# Patient Record
Sex: Male | Born: 1965 | Race: White | Hispanic: No | Marital: Married | Smoking: Never smoker
Health system: Southern US, Community
[De-identification: ages and names within clinical notes are randomized; demographics above are authoritative.]

## PROBLEM LIST (undated history)

## (undated) DIAGNOSIS — M5136 Other intervertebral disc degeneration, lumbar region: Secondary | ICD-10-CM

## (undated) DIAGNOSIS — E669 Obesity, unspecified: Secondary | ICD-10-CM

## (undated) DIAGNOSIS — E785 Hyperlipidemia, unspecified: Secondary | ICD-10-CM

## (undated) DIAGNOSIS — I10 Essential (primary) hypertension: Secondary | ICD-10-CM

## (undated) DIAGNOSIS — G4733 Obstructive sleep apnea (adult) (pediatric): Secondary | ICD-10-CM

## (undated) DIAGNOSIS — M51369 Other intervertebral disc degeneration, lumbar region without mention of lumbar back pain or lower extremity pain: Secondary | ICD-10-CM

## (undated) HISTORY — PX: NO PAST SURGERIES: SHX2092

## (undated) HISTORY — PX: BACK SURGERY: SHX140

## (undated) HISTORY — DX: Obstructive sleep apnea (adult) (pediatric): G47.33

## (undated) HISTORY — DX: Essential (primary) hypertension: I10

## (undated) HISTORY — DX: Obesity, unspecified: E66.9

## (undated) HISTORY — DX: Hyperlipidemia, unspecified: E78.5

---

## 2000-03-16 ENCOUNTER — Emergency Department (HOSPITAL_COMMUNITY): Admission: EM | Admit: 2000-03-16 | Discharge: 2000-03-16 | Payer: Self-pay | Admitting: Emergency Medicine

## 2002-12-25 ENCOUNTER — Ambulatory Visit (HOSPITAL_BASED_OUTPATIENT_CLINIC_OR_DEPARTMENT_OTHER): Admission: RE | Admit: 2002-12-25 | Discharge: 2002-12-25 | Payer: Self-pay | Admitting: Family Medicine

## 2004-07-15 ENCOUNTER — Ambulatory Visit: Payer: Self-pay | Admitting: Family Medicine

## 2004-08-12 ENCOUNTER — Ambulatory Visit: Payer: Self-pay | Admitting: Family Medicine

## 2004-12-23 ENCOUNTER — Ambulatory Visit: Payer: Self-pay | Admitting: Internal Medicine

## 2005-01-20 ENCOUNTER — Ambulatory Visit: Payer: Self-pay | Admitting: Internal Medicine

## 2005-01-21 ENCOUNTER — Emergency Department (HOSPITAL_COMMUNITY): Admission: EM | Admit: 2005-01-21 | Discharge: 2005-01-21 | Payer: Self-pay | Admitting: Emergency Medicine

## 2005-01-23 ENCOUNTER — Ambulatory Visit: Payer: Self-pay | Admitting: Internal Medicine

## 2005-02-10 ENCOUNTER — Ambulatory Visit: Payer: Self-pay | Admitting: Internal Medicine

## 2005-05-26 ENCOUNTER — Ambulatory Visit: Payer: Self-pay | Admitting: Internal Medicine

## 2005-05-27 ENCOUNTER — Encounter: Payer: Self-pay | Admitting: Internal Medicine

## 2005-07-17 ENCOUNTER — Ambulatory Visit: Payer: Self-pay | Admitting: Family Medicine

## 2006-04-20 ENCOUNTER — Ambulatory Visit: Payer: Self-pay | Admitting: Internal Medicine

## 2006-04-22 DIAGNOSIS — E669 Obesity, unspecified: Secondary | ICD-10-CM

## 2006-04-22 DIAGNOSIS — E119 Type 2 diabetes mellitus without complications: Secondary | ICD-10-CM | POA: Insufficient documentation

## 2006-04-22 DIAGNOSIS — I10 Essential (primary) hypertension: Secondary | ICD-10-CM | POA: Insufficient documentation

## 2006-10-23 ENCOUNTER — Encounter (INDEPENDENT_AMBULATORY_CARE_PROVIDER_SITE_OTHER): Payer: Self-pay | Admitting: *Deleted

## 2006-12-07 ENCOUNTER — Ambulatory Visit: Payer: Self-pay | Admitting: Internal Medicine

## 2006-12-21 ENCOUNTER — Ambulatory Visit: Payer: Self-pay | Admitting: Internal Medicine

## 2006-12-22 LAB — CONVERTED CEMR LAB
AST: 30 units/L (ref 0–37)
Bilirubin, Direct: 0.1 mg/dL (ref 0.0–0.3)
Chloride: 109 meq/L (ref 96–112)
Creatinine,U: 228.1 mg/dL
Eosinophils Absolute: 0.5 10*3/uL (ref 0.0–0.6)
Eosinophils Relative: 9.7 % — ABNORMAL HIGH (ref 0.0–5.0)
GFR calc non Af Amer: 87 mL/min
Glucose, Bld: 129 mg/dL — ABNORMAL HIGH (ref 70–99)
HCT: 41.3 % (ref 39.0–52.0)
Hemoglobin: 14.5 g/dL (ref 13.0–17.0)
Hgb A1c MFr Bld: 5.8 % (ref 4.6–6.0)
Lymphocytes Relative: 20.6 % (ref 12.0–46.0)
MCV: 91 fL (ref 78.0–100.0)
Neutro Abs: 3.4 10*3/uL (ref 1.4–7.7)
Neutrophils Relative %: 61.8 % (ref 43.0–77.0)
RBC: 4.54 M/uL (ref 4.22–5.81)
Sodium: 143 meq/L (ref 135–145)
WBC: 5.4 10*3/uL (ref 4.5–10.5)

## 2006-12-28 ENCOUNTER — Encounter: Payer: Self-pay | Admitting: Internal Medicine

## 2007-01-04 ENCOUNTER — Encounter: Payer: Self-pay | Admitting: Internal Medicine

## 2007-03-01 ENCOUNTER — Ambulatory Visit: Payer: Self-pay | Admitting: Internal Medicine

## 2007-04-26 ENCOUNTER — Ambulatory Visit: Payer: Self-pay | Admitting: Internal Medicine

## 2007-04-26 DIAGNOSIS — E785 Hyperlipidemia, unspecified: Secondary | ICD-10-CM | POA: Insufficient documentation

## 2007-08-02 ENCOUNTER — Ambulatory Visit: Payer: Self-pay | Admitting: Internal Medicine

## 2007-08-02 DIAGNOSIS — R220 Localized swelling, mass and lump, head: Secondary | ICD-10-CM | POA: Insufficient documentation

## 2007-08-02 DIAGNOSIS — M25579 Pain in unspecified ankle and joints of unspecified foot: Secondary | ICD-10-CM | POA: Insufficient documentation

## 2007-08-02 DIAGNOSIS — R221 Localized swelling, mass and lump, neck: Secondary | ICD-10-CM

## 2007-08-04 ENCOUNTER — Encounter (INDEPENDENT_AMBULATORY_CARE_PROVIDER_SITE_OTHER): Payer: Self-pay | Admitting: *Deleted

## 2007-08-05 LAB — CONVERTED CEMR LAB
Cholesterol: 194 mg/dL (ref 0–200)
HDL: 29.3 mg/dL — ABNORMAL LOW (ref >39.0)
Microalb Creat Ratio: 19 mg/g (ref 0.0–30.0)
Total CHOL/HDL Ratio: 6.6
Triglycerides: 228 mg/dL (ref 0–149)
VLDL: 46 mg/dL — ABNORMAL HIGH (ref 0–40)

## 2007-08-15 ENCOUNTER — Emergency Department (HOSPITAL_COMMUNITY): Admission: EM | Admit: 2007-08-15 | Discharge: 2007-08-15 | Payer: Self-pay | Admitting: Emergency Medicine

## 2007-08-15 IMAGING — CR DG ANKLE COMPLETE 3+V*L*
3 series · 3 of 3 positions shown · non-contrast
Comparison: None

CLINICAL DATA: Ankle pain for 3 weeks

LEFT ANKLE COMPLETE - 3+ VIEW

[t ankle joint ap left *]
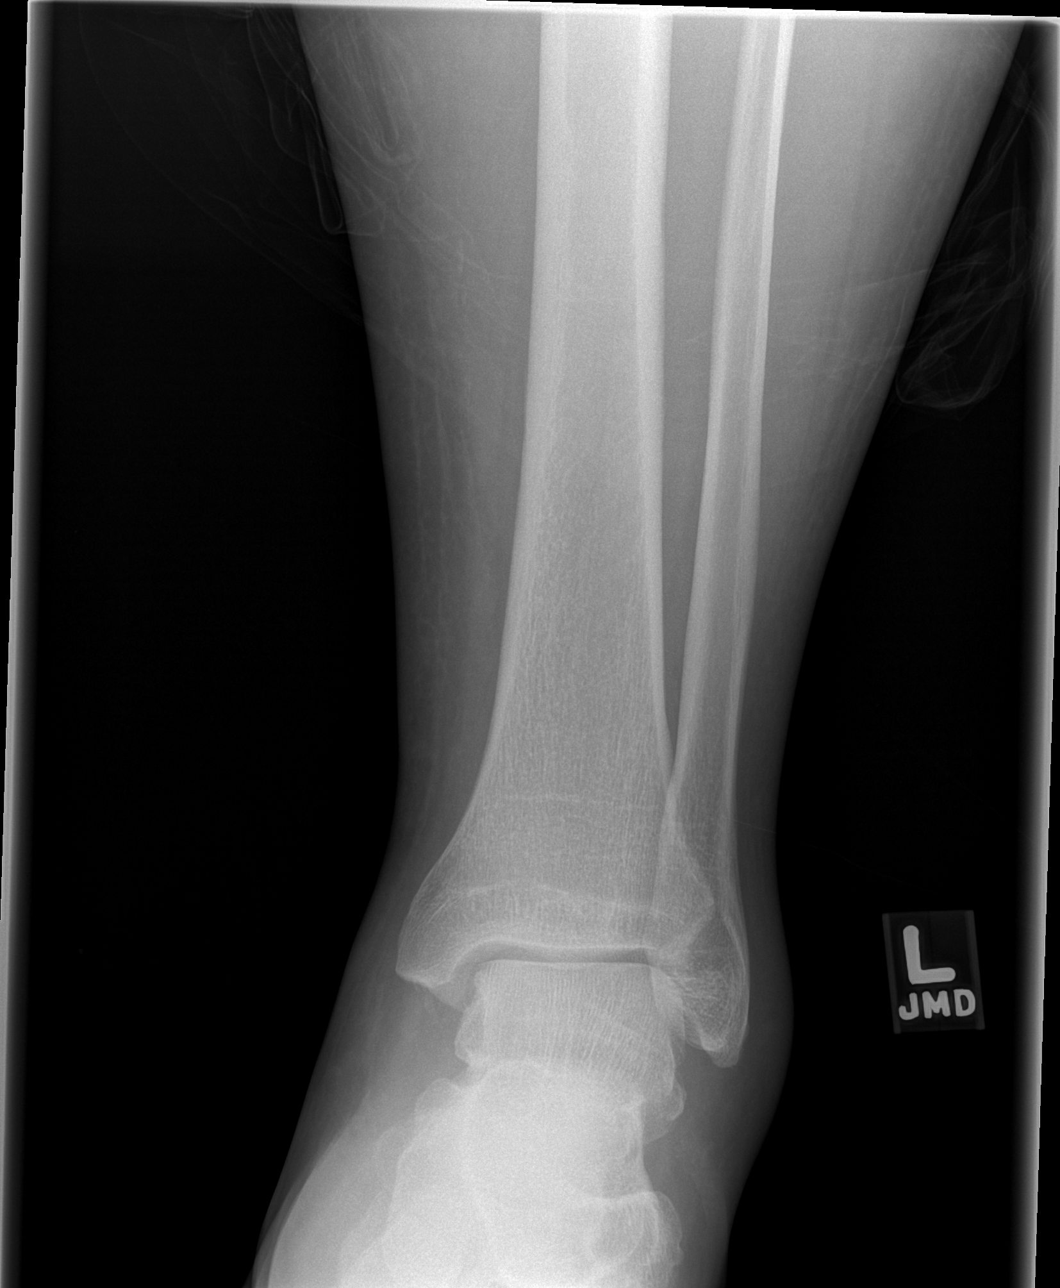

[t ankle joint oblique left *]
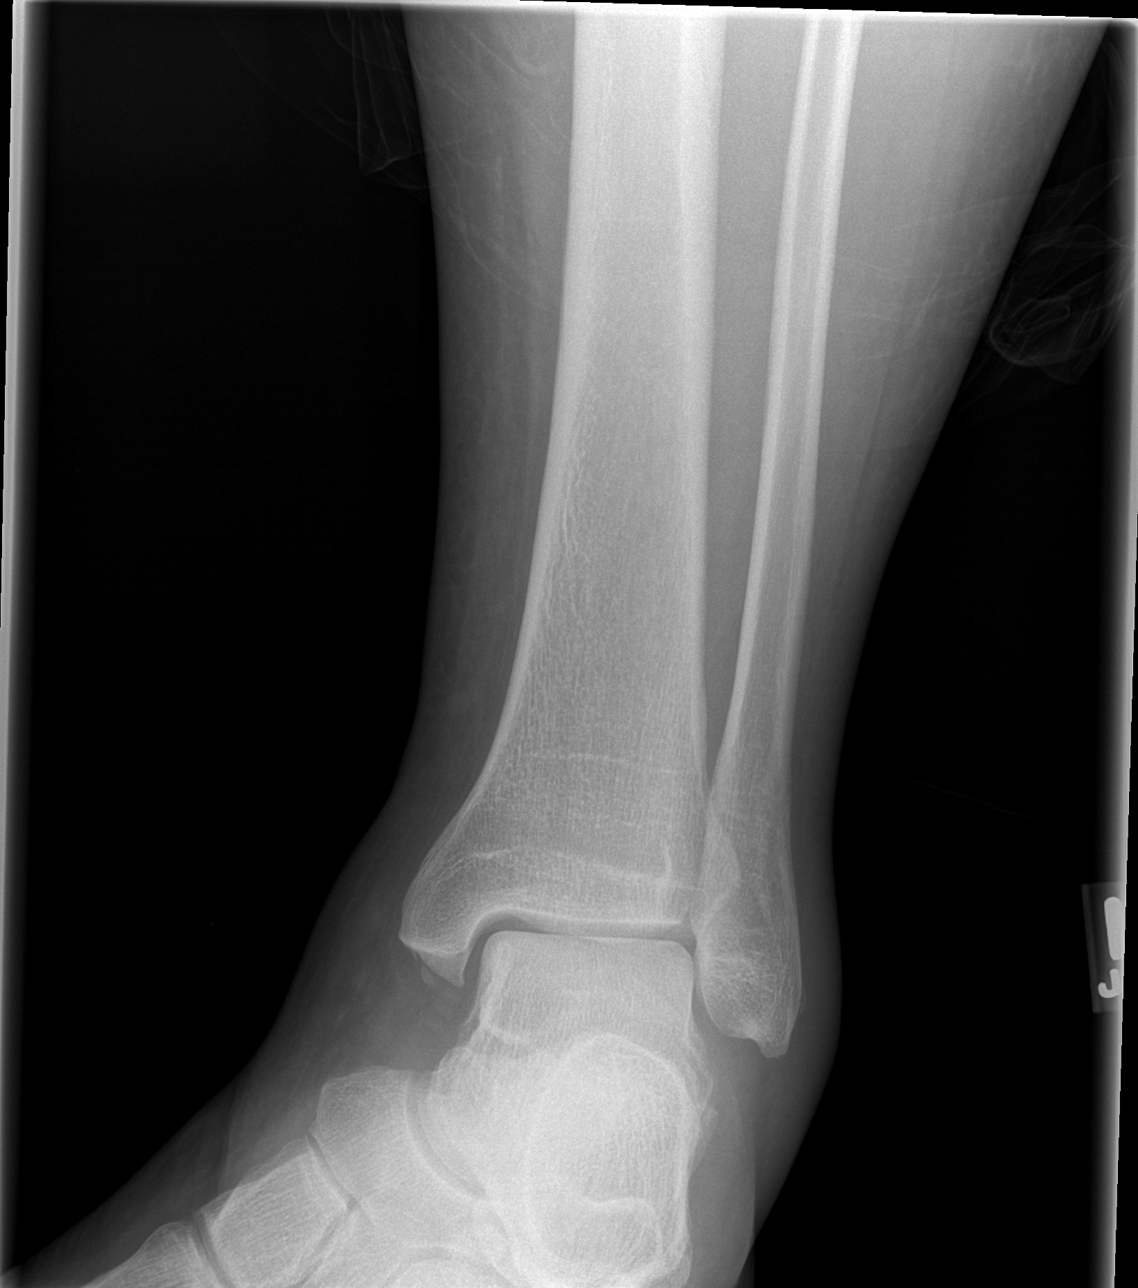

[t ankle joint lat left *]
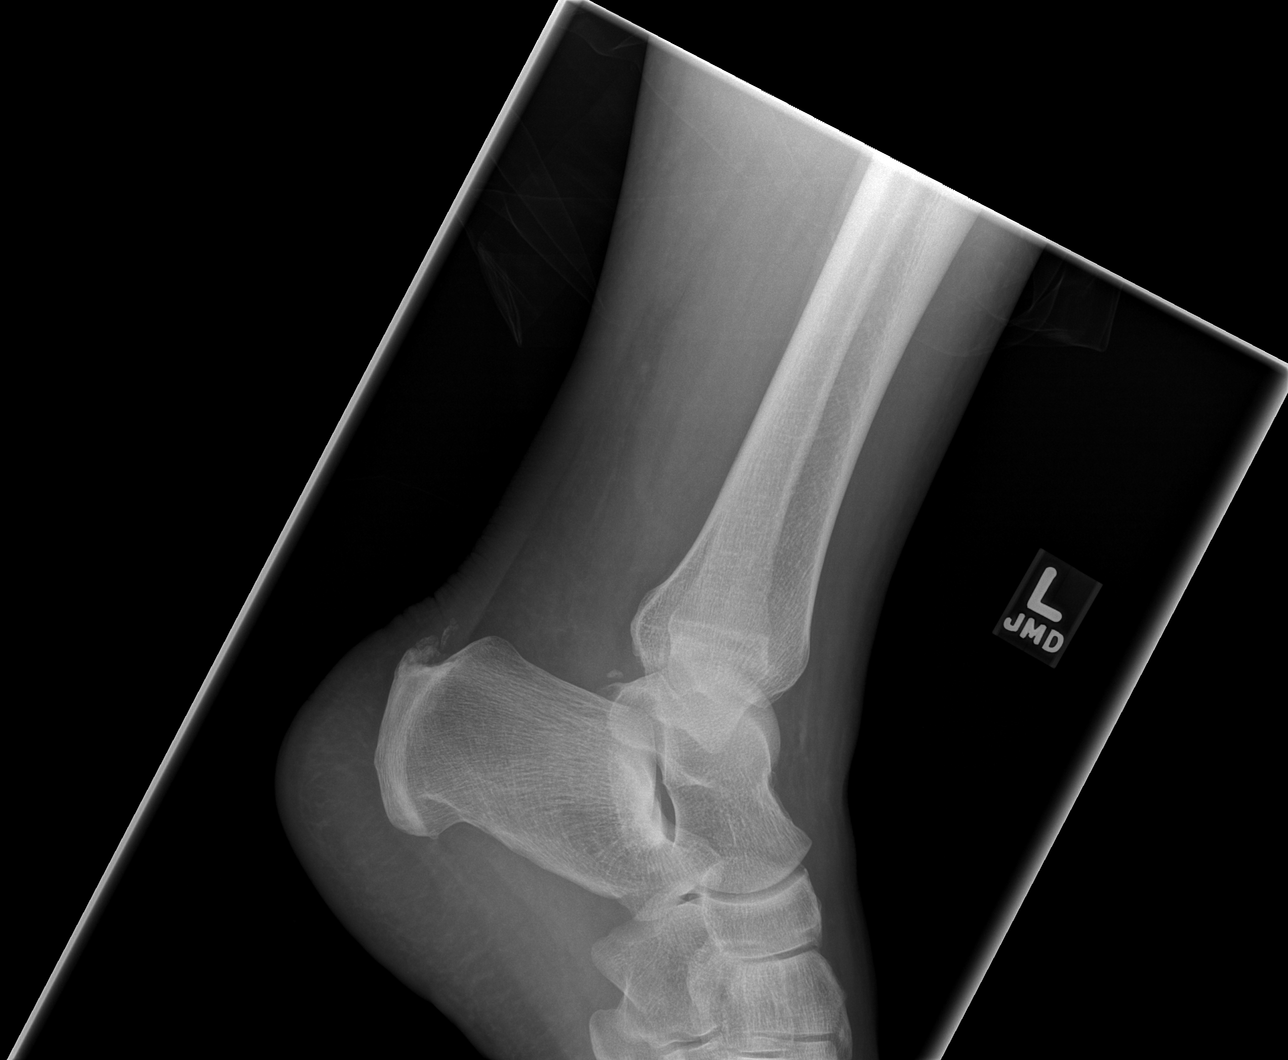

[3 of 3 positions shown; findings below may reference images not displayed]

FINDINGS: Small bony densities are seen inferior to the medial
malleolus, adjacent to the posterior calcaneus, and adjacent to the
posterior aspect of the talus.  A avulsion fractures of
indeterminate age from dislocations are suggested.  Soft tissue
swelling about the medial and lateral malleoli is seen.
IMPRESSION: A avulsion fractures of the indeterminate age as described.  Soft
tissue swelling about the ankle joint is noted.

## 2007-11-01 ENCOUNTER — Ambulatory Visit: Payer: Self-pay | Admitting: Internal Medicine

## 2007-11-08 LAB — CONVERTED CEMR LAB
Calcium: 9.4 mg/dL (ref 8.4–10.5)
Cholesterol: 192 mg/dL (ref 0–200)
Creatinine, Ser: 1 mg/dL (ref 0.4–1.5)
GFR calc Af Amer: 104 mL/min
GFR calc non Af Amer: 86 mL/min
HDL: 30.2 mg/dL — ABNORMAL LOW (ref >39.0)
Hgb A1c MFr Bld: 6.2 % — ABNORMAL HIGH (ref 4.6–6.0)
Total CHOL/HDL Ratio: 6.4
Triglycerides: 295 mg/dL (ref 0–149)
VLDL: 59 mg/dL — ABNORMAL HIGH (ref 0–40)

## 2007-12-13 ENCOUNTER — Ambulatory Visit: Payer: Self-pay | Admitting: Internal Medicine

## 2007-12-13 ENCOUNTER — Encounter (INDEPENDENT_AMBULATORY_CARE_PROVIDER_SITE_OTHER): Payer: Self-pay | Admitting: *Deleted

## 2007-12-17 ENCOUNTER — Telehealth: Payer: Self-pay | Admitting: Internal Medicine

## 2008-03-13 ENCOUNTER — Ambulatory Visit: Payer: Self-pay | Admitting: Internal Medicine

## 2008-03-17 ENCOUNTER — Telehealth (INDEPENDENT_AMBULATORY_CARE_PROVIDER_SITE_OTHER): Payer: Self-pay | Admitting: *Deleted

## 2008-03-17 LAB — CONVERTED CEMR LAB
BUN: 13 mg/dL (ref 6–23)
Calcium: 9.2 mg/dL (ref 8.4–10.5)
Eosinophils Absolute: 0.2 10*3/uL (ref 0.0–0.7)
Eosinophils Relative: 4.8 % (ref 0.0–5.0)
GFR calc Af Amer: 104 mL/min
GFR calc non Af Amer: 86 mL/min
HCT: 43.5 % (ref 39.0–52.0)
Hemoglobin: 15.3 g/dL (ref 13.0–17.0)
MCV: 90.3 fL (ref 78.0–100.0)
Monocytes Absolute: 0.4 10*3/uL (ref 0.1–1.0)
Neutro Abs: 3.5 10*3/uL (ref 1.4–7.7)
Platelets: 211 10*3/uL (ref 150–400)
Potassium: 4.3 meq/L (ref 3.5–5.1)
RDW: 12 % (ref 11.5–14.6)
Sodium: 139 meq/L (ref 135–145)

## 2008-04-17 ENCOUNTER — Ambulatory Visit: Payer: Self-pay | Admitting: Family Medicine

## 2008-06-12 ENCOUNTER — Encounter (INDEPENDENT_AMBULATORY_CARE_PROVIDER_SITE_OTHER): Payer: Self-pay | Admitting: *Deleted

## 2008-06-26 ENCOUNTER — Ambulatory Visit: Payer: Self-pay | Admitting: Internal Medicine

## 2008-06-26 LAB — HM DIABETES FOOT EXAM

## 2008-06-29 LAB — CONVERTED CEMR LAB
Alkaline Phosphatase: 68 units/L (ref 39–117)
Bilirubin, Direct: 0.1 mg/dL (ref 0.0–0.3)
Cholesterol: 181 mg/dL (ref 0–200)
Direct LDL: 102.6 mg/dL
Hgb A1c MFr Bld: 6.1 % — ABNORMAL HIGH (ref 4.6–6.0)
LDL Cholesterol: 105 mg/dL — ABNORMAL HIGH (ref 0–99)
Microalb Creat Ratio: 2.8 mg/g (ref 0.0–30.0)
Microalb, Ur: 0.5 mg/dL (ref 0.0–1.9)
Total Bilirubin: 1 mg/dL (ref 0.3–1.2)
Total Protein: 7.3 g/dL (ref 6.0–8.3)

## 2008-10-23 ENCOUNTER — Ambulatory Visit: Payer: Self-pay | Admitting: Internal Medicine

## 2008-10-26 LAB — CONVERTED CEMR LAB
Calcium: 9.1 mg/dL (ref 8.4–10.5)
GFR calc non Af Amer: 110.96 mL/min (ref >60)
Glucose, Bld: 187 mg/dL — ABNORMAL HIGH (ref 70–99)
Hgb A1c MFr Bld: 7.6 % — ABNORMAL HIGH (ref 4.6–6.5)
Potassium: 3.9 meq/L (ref 3.5–5.1)
Sodium: 137 meq/L (ref 135–145)

## 2009-05-28 ENCOUNTER — Ambulatory Visit: Payer: Self-pay | Admitting: Internal Medicine

## 2009-05-28 ENCOUNTER — Encounter (INDEPENDENT_AMBULATORY_CARE_PROVIDER_SITE_OTHER): Payer: Self-pay | Admitting: *Deleted

## 2009-06-12 ENCOUNTER — Encounter (INDEPENDENT_AMBULATORY_CARE_PROVIDER_SITE_OTHER): Payer: Self-pay | Admitting: *Deleted

## 2009-06-12 ENCOUNTER — Ambulatory Visit: Payer: Self-pay | Admitting: Internal Medicine

## 2009-07-09 ENCOUNTER — Telehealth (INDEPENDENT_AMBULATORY_CARE_PROVIDER_SITE_OTHER): Payer: Self-pay | Admitting: *Deleted

## 2009-07-30 ENCOUNTER — Ambulatory Visit: Payer: Self-pay | Admitting: Internal Medicine

## 2009-08-01 LAB — CONVERTED CEMR LAB
ALT: 21 units/L (ref 0–53)
AST: 18 units/L (ref 0–37)
BUN: 13 mg/dL (ref 6–23)
Basophils Relative: 0.5 % (ref 0.0–3.0)
Calcium: 9 mg/dL (ref 8.4–10.5)
Cholesterol: 204 mg/dL — ABNORMAL HIGH (ref 0–200)
Creatinine, Ser: 0.9 mg/dL (ref 0.4–1.5)
Direct LDL: 123.5 mg/dL
Eosinophils Relative: 3.5 % (ref 0.0–5.0)
GFR calc non Af Amer: 96.53 mL/min (ref >60)
HDL: 39.8 mg/dL (ref >39.00)
Hgb A1c MFr Bld: 7.4 % — ABNORMAL HIGH (ref 4.6–6.5)
Lymphocytes Relative: 26.1 % (ref 12.0–46.0)
MCV: 90.9 fL (ref 78.0–100.0)
Microalb, Ur: 1.5 mg/dL (ref 0.0–1.9)
Monocytes Absolute: 0.3 10*3/uL (ref 0.1–1.0)
Neutrophils Relative %: 63.1 % (ref 43.0–77.0)
Platelets: 245 10*3/uL (ref 150.0–400.0)
RBC: 4.63 M/uL (ref 4.22–5.81)
Total CHOL/HDL Ratio: 5
Triglycerides: 243 mg/dL — ABNORMAL HIGH (ref 0.0–149.0)
WBC: 4.8 10*3/uL (ref 4.5–10.5)

## 2009-10-29 ENCOUNTER — Ambulatory Visit: Payer: Self-pay | Admitting: Internal Medicine

## 2009-10-30 LAB — CONVERTED CEMR LAB: TSH: 1.76 microintl units/mL (ref 0.35–5.50)

## 2010-05-14 NOTE — Letter (Signed)
Summary: Lucien No Show Letter  [place] [facility] [place]  [address] [place], [place] [address]   Phone: [phone]  Fax: [phone]    05/28/2009 MRN: [id number]  [name] [address] [place], [place]  [address]   Dear Mr. Gugliotta,   Our records indicate that you missed your scheduled appointment with Dr. Drue Novel on 05/28/09.  Please contact this office to reschedule your appointment as soon as possible.  It is important that you keep your scheduled appointments with your physician, so we can provide you the best care possible.  Please be advised that there may be a charge for "no show" appointments.    Sincerely,   [place] [facility]

## 2010-05-14 NOTE — Assessment & Plan Note (Signed)
Summary: diahrea/kdc   Vital Signs:  Patient profile:   45 year old male Height:      76 inches Weight:      353.38 pounds Temp:     97.6 degrees F oral Pulse rate:   64 / minute BP sitting:   120 / 80  Vitals Entered By: Kandice Hams (June 12, 2009 12:38 PM) CC: diarrhea Comments -c/o diarrhea x 4 days, taking Imodium not helping --drinking fluids, Gatorade -no vomiting, slight nausea    History of Present Illness: diarrhea for 3 days abundant watery brown stools no hematochezia or blood in the stools  review of systems denies fever, nausea, vomiting admits to mild headache and some runny nose but no sore throat or cough she had mild headache for the last few days no recent antibiotics has not eaten in any unusual places no sick contacts w/  similar symptoms   Allergies: 1)  * Penicillins Group  Past History:  Past Medical History: Reviewed history from 10/23/2008 and no changes required. DIABETES MELLITUS, TYPE II (ICD-250.00) HYPERLIPIDEMIA--High TG HYPERTENSION (ICD-401.9) OBESITY NOS (ICD-278.00)  Past Surgical History: Reviewed history from 03/13/2008 and no changes required. no surgeries   Social History: Reviewed history from 10/23/2008 and no changes required. recently remarried for the  3rd time with a lady from the  [place] 1 child  Occupation:tires Curator  Review of Systems      See HPI  Physical Exam  General:  alert, well-developed, and overweight-appearing.   Lungs:  normal respiratory effort, no intercostal retractions, no accessory muscle use, and normal breath sounds.   Heart:  normal rate, regular rhythm, and no murmur.   Abdomen:  soft, no distention, no masses, no guarding, and no rigidity.   diffuse mild tenderness, increase bowel sounds   Impression & Recommendations:  Problem # 1:  DIARRHEA (ICD-787.91) acute episode of diarrhea conservative treatment, printed material provided regards diarrhe from UpToDate    Complete Medication List: 1)  Niaspan 1000 Mg Tbcr (Niacin (antihyperlipidemic)) .... Take 1 tablet by mouth once a day 2)  Actos 45 Mg Tabs (Pioglitazone hcl) .Marland Kitchen.. 1 by mouth qd 3)  Micardis Hct 80-12.5 Mg Tabs (Telmisartan-hctz) .Marland Kitchen.. 1 by mouth qd 4)  Baby Aspirin 81 Mg Chew (Aspirin) .Marland Kitchen.. 1 by mouth once daily 5)  Pravachol 40 Mg Tabs (Pravastatin sodium) .Marland Kitchen.. 1 by mouth at bedtime 6)  Metformin Hcl 1000 Mg Tabs (Metformin hcl) .Marland Kitchen.. 1 by mouth bid 7)  Etodolac 500 Mg Tabs (Etodolac) .Marland Kitchen.. 1 two times a day as needed for pain- take with food 8)  One Touch Ultra 2 Lancets  .... Checks bs once daily dx 250.00 9)  One Touch Ultra 2 Test Strips  .... Checks bs once daily dx 250.00  Patient Instructions: 1)  call if you are not improving in a few days 2)  call anytime if you feel  worse 3)  you are due for a routine office visit

## 2010-05-14 NOTE — Assessment & Plan Note (Signed)
Summary: FOLLOWUP/ALR   Vital Signs:  Patient profile:   45 year old male Height:      76 inches Weight:      352 pounds BMI:     43.00 Pulse rate:   74 / minute BP sitting:   110 / 70  Vitals Entered By: Shary Decamp (July 30, 2009 9:04 AM) CC: rov - fasting, avg fbs @ home 145 Is Patient Diabetic? Yes   History of Present Illness: last routine office visit 10/2008  DIABETES-- ambulatory CBGs in the 140s, gave up sweet tea 3 weeks ago  HYPERLIPIDEMIA-- good medication compliance  HYPERTENSION -- now on a generic, feels very good,  ambulatory BPs in the 120/70    Current Medications (verified): 1)  Niaspan 1000 Mg Tbcr (Niacin (Antihyperlipidemic)) .... Take 1 Tablet By Mouth Once A Day 2)  Actos 45 Mg Tabs (Pioglitazone Hcl) .Marland Kitchen.. 1 By Mouth Qd 3)  Losartan Potassium-Hctz 100-12.5 Mg Tabs (Losartan Potassium-Hctz) .Marland Kitchen.. 1 By Mouth Once Daily 4)  Baby Aspirin 81 Mg  Chew (Aspirin) .Marland Kitchen.. 1 By Mouth Once Daily 5)  Pravachol 40 Mg Tabs (Pravastatin Sodium) .Marland Kitchen.. 1 By Mouth At Bedtime 6)  Metformin Hcl 1000 Mg Tabs (Metformin Hcl) .Marland Kitchen.. 1 By Mouth Bid 7)  Etodolac 500 Mg Tabs (Etodolac) .Marland Kitchen.. 1 Two Times A Day As Needed For Pain- Take With Food 8)  One Touch Ultra 2 Lancets .... Checks Bs Once Daily Dx 250.00 9)  One Touch Ultra 2 Test Strips .... Checks Bs Once Daily Dx 250.00  Allergies (verified): 1)  * Penicillins Group  Past History:  Past Medical History: Reviewed history from 10/23/2008 and no changes required. DIABETES MELLITUS, TYPE II (ICD-250.00) HYPERLIPIDEMIA--High TG HYPERTENSION (ICD-401.9) OBESITY NOS (ICD-278.00)  Past Surgical History: Reviewed history from 03/13/2008 and no changes required. 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 Resp:  Denies cough and shortness of breath. GI:  Denies bloody stools, diarrhea, nausea, and vomiting.  Physical Exam  General:  alert, well-developed, and overweight-appearing.   Lungs:  normal respiratory effort, no intercostal retractions, no accessory muscle use, and normal breath sounds.   Heart:  normal rate, regular rhythm, and no murmur.   Extremities:  no pretibial edema bilaterally  Psych:  Oriented X3, memory intact for recent and remote, normally interactive, good eye contact, not anxious appearing, and not depressed appearing.     Impression & Recommendations:  Problem # 1:  DIABETES MELLITUS, TYPE II (ICD-250.00) has improved his lifestyle compared to  last year, see social history labs His updated medication list for this problem includes:    Actos 45 Mg Tabs (Pioglitazone hcl) .Marland Kitchen... 1 by mouth qd    Metformin Hcl 1000 Mg Tabs (Metformin hcl) .Marland Kitchen... 1 by mouth bid    Losartan Potassium-hctz 100-12.5 Mg Tabs (Losartan potassium-hctz) .Marland Kitchen... 1 by mouth once daily    Baby Aspirin 81 Mg Chew (Aspirin) .Marland Kitchen... 1 by mouth once daily  Labs Reviewed: Creat: 0.8 (10/23/2008)    Reviewed HgBA1c results: 7.6 (10/23/2008)  6.1 (06/26/2008)  Orders: TLB-A1C / Hgb A1C (Glycohemoglobin) (83036-A1C) TLB-Microalbumin/Creat Ratio, Urine (82043-MALB)  Problem # 2:  HYPERLIPIDEMIA (ICD-272.4) due for labs His updated medication list for this problem includes:  Niaspan 1000 Mg Tbcr (Niacin (antihyperlipidemic)) .Marland Kitchen... Take 1 tablet by mouth once a day    Pravachol 40 Mg Tabs (Pravastatin sodium) .Marland Kitchen... 1 by mouth at bedtime  Orders: Venipuncture ([address]) TLB-ALT (SGPT) (84460-ALT) TLB-AST (SGOT) (84450-SGOT) TLB-Lipid Panel (80061-LIPID)  Labs Reviewed: SGOT: 17 (06/26/2008)   SGPT: 23 (06/26/2008)   HDL:31.8 (06/26/2008), 30.2 (11/01/2007)  LDL:105 (06/26/2008), DEL (11/01/2007)  Chol:181 (06/26/2008), 192 (11/01/2007)  Trig:223  (06/26/2008), 295 (11/01/2007)  Problem # 3:  HYPERTENSION (ICD-401.9) was switched from micardis to  losartan, tolerating  generics very well, labs His updated medication list for this problem includes:    Losartan Potassium-hctz 100-12.5 Mg Tabs (Losartan potassium-hctz) .Marland Kitchen... 1 by mouth once daily  Orders: TLB-BMP (Basic Metabolic Panel-BMET) (80048-METABOL) TLB-CBC Platelet - w/Differential (85025-CBCD)  BP today: 110/70 Prior BP: 120/80 (06/12/2009)  Labs Reviewed: K+: 3.9 (10/23/2008) Creat: : 0.8 (10/23/2008)   Chol: 181 (06/26/2008)   HDL: 31.8 (06/26/2008)   LDL: 105 (06/26/2008)   TG: 223 (06/26/2008)  Complete Medication List: 1)  Niaspan 1000 Mg Tbcr (Niacin (antihyperlipidemic)) .... Take 1 tablet by mouth once a day 2)  Pravachol 40 Mg Tabs (Pravastatin sodium) .Marland Kitchen.. 1 by mouth at bedtime 3)  Actos 45 Mg Tabs (Pioglitazone hcl) .Marland Kitchen.. 1 by mouth qd 4)  Metformin Hcl 1000 Mg Tabs (Metformin hcl) .Marland Kitchen.. 1 by mouth bid 5)  Losartan Potassium-hctz 100-12.5 Mg Tabs (Losartan potassium-hctz) .Marland Kitchen.. 1 by mouth once daily 6)  Baby Aspirin 81 Mg Chew (Aspirin) .Marland Kitchen.. 1 by mouth once daily 7)  Etodolac 500 Mg Tabs (Etodolac) .Marland Kitchen.. 1 two times a day as needed for pain- take with food 8)  One Touch Ultra 2 Lancets  .... Checks bs once daily dx 250.00 9)  One Touch Ultra 2 Test Strips  .... Checks bs once daily dx 250.00  Patient Instructions: 1)  Please schedule a follow-up appointment in 4 months (PHYSICAL EXAM) Prescriptions: ACTOS 45 MG TABS (PIOGLITAZONE HCL) 1 by mouth qd  #30 x 6   Entered by:   Shary Decamp   Authorized by:   Nolon Rod. Ladavion Savitz MD   Signed by:   Shary Decamp on 07/30/2009   Method used:   [facility]        [facility] [address] [phone]* (retail)       [address]       [place], [place]  [address]       Ph: [phone] or [phone]       Fax: [phone]   RxID:   [id number] PRAVACHOL 40 MG TABS (PRAVASTATIN SODIUM) 1 by mouth  at bedtime  #30 Tablet x 6   Entered by:   Shary Decamp   Authorized by:   Nolon Rod. Cheree Fowles MD   Signed by:   Shary Decamp on 07/30/2009   Method used:   [facility]        [facility] [address] [phone]* (retail)       [address]       [place], [place]  [address]       Ph: [phone] or [phone]       Fax: [phone]   RxID:   [id number]

## 2010-05-14 NOTE — Assessment & Plan Note (Signed)
Summary: physical exam-- //lch   Vital Signs:  Patient profile:   45 year old male Weight:      374.13 pounds Pulse rate:   96 / minute Pulse rhythm:   regular BP sitting:   130 / 78  (left arm) Cuff size:   large  Vitals Entered By: Army Fossa CMA (October 29, 2009 9:28 AM) CC: Pt here for f/u on DM: fasting.   History of Present Illness: CPX      Current Medications (verified): 1)  Niaspan 1000 Mg Tbcr (Niacin (Antihyperlipidemic)) .... Take 1 Tablet By Mouth Once A Day 2)  Pravachol 40 Mg Tabs (Pravastatin Sodium) .Marland Kitchen.. 1 By Mouth At Bedtime 3)  Actos 45 Mg Tabs (Pioglitazone Hcl) .Marland Kitchen.. 1 By Mouth Qd 4)  Metformin Hcl 1000 Mg Tabs (Metformin Hcl) .Marland Kitchen.. 1 1/2 By Mouth Two Times A Day 5)  Losartan Potassium-Hctz 100-12.5 Mg Tabs (Losartan Potassium-Hctz) .Marland Kitchen.. 1 By Mouth Once Daily 6)  Baby Aspirin 81 Mg  Chew (Aspirin) .Marland Kitchen.. 1 By Mouth Once Daily 7)  Etodolac 500 Mg Tabs (Etodolac) .Marland Kitchen.. 1 Two Times A Day As Needed For Pain- Take With Food 8)  One Touch Ultra 2 Lancets .... Checks Bs Once Daily Dx 250.00 9)  One Touch Ultra 2 Test Strips .... Checks Bs Once Daily Dx 250.00  Allergies: 1)  * Penicillins Group  Past History:  Past Medical History: DIABETES MELLITUS, TYPE II (ICD-250.00) HYPERLIPIDEMIA--High TG HYPERTENSION  OBESITY   Past Surgical History: Reviewed history from 03/13/2008 and no changes required. no surgeries   Family History: Reviewed history from 03/13/2008 and no changes required. 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 CV:  Denies chest pain or discomfort, palpitations, and  swelling of feet; ambulatory BPs checked rarely, usually in the 120s . Resp:  Denies cough and shortness of breath. GI:  Denies bloody stools, diarrhea, nausea, and vomiting. GU:  Denies dysuria and hematuria. Endo:  ambulatory CBGs in the 110s .  Physical Exam  General:  alert, well-developed, and overweight-appearing.  has gained almost 20 pounds since the last office visit Neck:  full ROM and no thyromegaly.   Lungs:  normal respiratory effort, no intercostal retractions, no accessory muscle use, and normal breath sounds.   Heart:  Normal rate and regular rhythm. S1 and S2 normal without gallop, murmur, click, rub or other extra sounds. Abdomen:  soft, non-tender, no distention, no masses, no guarding, and no rigidity.   Extremities:  no lower extremity pitting edema Psych:  Cognition and judgment appear intact. Alert and cooperative with normal attention span and concentration.  not anxious appearing and not depressed appearing.     Impression & Recommendations:  Problem # 1:  HEALTH MAINTENANCE EXAM (ICD-V70.0) Td 2003 chart reviewed, update of labs except for thyroid counseled  about diet exercise, see  next Labs  Orders: Venipuncture ([address]) TLB-TSH (Thyroid Stimulating Hormone) (84443-TSH) Specimen Handling ([address])  Problem # 2:  DIABETES MELLITUS, TYPE II (ICD-250.00) has gained almost 20 pounds since the last  office visit, states that his diet has been poor, wife has been out of town. Recommend go back to a healthy diet,  lose weight. If diabetes is not well controlled, most likely will insist on diet rather than  increase his medication patient education: eye and feet care His updated medication list for this problem includes:    Actos 45 Mg Tabs (Pioglitazone hcl) .Marland Kitchen... 1 by mouth qd    Metformin Hcl 1000 Mg Tabs (Metformin hcl) .Marland Kitchen... 1 1/2 by mouth two times a day    Losartan Potassium-hctz 100-12.5 Mg Tabs (Losartan potassium-hctz) .Marland Kitchen... 1 by mouth once daily     Baby Aspirin 81 Mg Chew (Aspirin) .Marland Kitchen... 1 by mouth once daily  Orders: TLB-A1C / Hgb A1C (Glycohemoglobin) (83036-A1C) Specimen Handling ([address])  Problem # 3:  HYPERTENSION (ICD-401.9) seems  well controlled His updated medication list for this problem includes:    Losartan Potassium-hctz 100-12.5 Mg Tabs (Losartan potassium-hctz) .Marland Kitchen... 1 by mouth once daily  BP today: 130/78 Prior BP: 110/70 (07/30/2009)  Labs Reviewed: K+: 3.8 (07/30/2009) Creat: : 0.9 (07/30/2009)   Chol: 204 (07/30/2009)   HDL: 39.80 (07/30/2009)   LDL: 105 (06/26/2008)   TG: 243.0 (07/30/2009)  Complete Medication List: 1)  Niaspan 1000 Mg Tbcr (Niacin (antihyperlipidemic)) .... Take 1 tablet by mouth once a day 2)  Pravachol 40 Mg Tabs (Pravastatin sodium) .Marland Kitchen.. 1 by mouth at bedtime 3)  Actos 45 Mg Tabs (Pioglitazone hcl) .Marland Kitchen.. 1 by mouth qd 4)  Metformin Hcl 1000 Mg Tabs (Metformin hcl) .Marland Kitchen.. 1 1/2 by mouth two times a day 5)  Losartan Potassium-hctz 100-12.5 Mg Tabs (Losartan potassium-hctz) .Marland Kitchen.. 1 by mouth once daily 6)  Baby Aspirin 81 Mg Chew (Aspirin) .Marland Kitchen.. 1 by mouth once daily 7)  Etodolac 500 Mg Tabs (Etodolac) .Marland Kitchen.. 1 two times a day as needed for pain- take with food 8)  One Touch Ultra 2 Lancets  .... Checks bs once daily dx 250.00 9)  One Touch Ultra 2 Test Strips  .... Checks bs once daily dx 250.00  Patient Instructions: 1)  Please schedule a follow-up appointment in 3 months .  Prescriptions: LOSARTAN POTASSIUM-HCTZ 100-12.5 MG TABS (LOSARTAN POTASSIUM-HCTZ) 1 by mouth once daily  #90 x 3   Entered and Authorized by:   Elita Quick E. Sailor Haughn MD   Signed by:   Nolon Rod. Neetu Carrozza MD on 10/29/2009   Method used:   [facility]ically to        [facility] [address] [phone]* (retail)       [address]       [place], [place]  [address]       Ph: [phone] or [phone]       Fax: [phone]   RxID:   [id number] METFORMIN HCL 1000 MG TABS (METFORMIN HCL) 1 1/2 by mouth two times a day   #270 x 3   Entered and Authorized by:   Nolon Rod. Kempton Milne MD   Signed by:   Nolon Rod. Shamarr Faucett MD on 10/29/2009   Method used:   Electronically to        [facility] [address] [phone]* (retail)       [name]       [place], [place]  [address]       Ph: [phone] or [phone]       Fax: [phone]   RxID:   [id number] ACTOS 45 MG TABS (PIOGLITAZONE HCL) 1 by mouth qd  #90 x 3   Entered and Authorized by:   Wanda Plump  MD   Signed by:   Nolon Rod Camille Dragan MD on 10/29/2009   Method used:   Electronically to        [facility] [address] [phone]* (retail)       [address]       [place], [place]  [address]       Ph: [phone] or [phone]       Fax: [phone]   RxID:   [id number] PRAVACHOL 40 MG TABS (PRAVASTATIN SODIUM) 1 by mouth at bedtime  #90 x 3   Entered and Authorized by:   Nolon Rod. Avya Flavell MD   Signed by:   Nolon Rod. Westin Knotts MD on 10/29/2009   Method used:   Electronically to        [facility] [address] [phone]* (retail)       [address]       [place], [place]  [address]       Ph: [phone] or [phone]       Fax: [phone]   RxID:   [id number] NIASPAN 1000 MG TBCR (NIACIN (ANTIHYPERLIPIDEMIC)) Take 1 tablet by mouth once a day  #90 x 3   Entered and Authorized by:   Elita Quick E. Bentli Llorente MD   Signed by:   Nolon Rod. Lamone Ferrelli MD on 10/29/2009   Method used:   Electronically to        [facility] [address] [phone]* (retail)       [address]       [place], [place]  [address]       Ph: [phone] or [phone]       Fax: [phone]   RxID:   [id number] ETODOLAC 500 MG TABS (ETODOLAC) 1 two times a day as needed for pain- take with food  #60 Tablet x 1   Entered and Authorized by:   Nolon Rod. Carles Florea MD   Signed by:   Nolon Rod. Duncan Alejandro MD on 10/29/2009   Method used:   Electronically to        [facility] [address] [phone]* (retail)       [address]       [place], [place]  [address]       Ph: [phone] or [phone]       Fax: [phone]   RxID:   [id number]

## 2010-05-14 NOTE — Letter (Signed)
Summary: Work Dietitian at [employer]  [address] [place], [place] [address]   Phone: [phone]  Fax: [phone]    Today's Date: June 12, 2009  Name of Patient: [name]  The above named patient had a medical visit today . Please take this into consideration when reviewing the time away from work/school.    Special Instructions:  [  ] None  [ x ] To be off the remainder of today, returning to the normal work / school schedule  Thurs 06/14/09.  [  ] To be off until the next scheduled appointment on ______________________.  [  ] Other ________________________________________________________________ ________________________________________________________________________   Sincerely yours,   Shary Decamp

## 2010-05-14 NOTE — Progress Notes (Signed)
Summary: [facility] 3/29, 3/31  Phone Note Refill Request Message from:  Fax from Pharmacy on July 09, 2009 3:01 PM  Refills Requested: Medication #1:  MICARDIS HCT 80-12.5 MG TABS 1 by mouth qd [facility] [address] fax [phone] alternative drug can we change to save money losartan-gydrochlorothiazide   Method Requested: Fax to Local Pharmacy Next Appointment Scheduled: no appt Initial call taken by: Barb Merino,  July 09, 2009 3:03 PM  Follow-up for Phone Call        if patient is willing to change, switch  to losartan-HCT 100-12.5 1 a day call 30 and 1RF Due for OV, schedule one in 2 or  3 weeks  Jose E. Paz MD  July 10, 2009 10:21 AM  left message on machine for pt to return call Shary Decamp  July 10, 2009 4:24 PM left message on machine for pt to return call Shary Decamp  July 12, 2009 12:06 PM    Additional Follow-up for Phone Call Additional follow up Details #1::        PT INFORMED AND AGREEABLE TO THE CHANGE IN MED,  RX FAXED AND OV SCHEDULED .Kandice Hams  July 12, 2009 2:08 PM  Additional Follow-up by: Kandice Hams,  July 12, 2009 2:09 PM    New/Updated Medications: LOSARTAN POTASSIUM-HCTZ 100-12.5 MG TABS (LOSARTAN POTASSIUM-HCTZ) 1 by mouth once daily Prescriptions: LOSARTAN POTASSIUM-HCTZ 100-12.5 MG TABS (LOSARTAN POTASSIUM-HCTZ) 1 by mouth once daily  #30 x 1   Entered by:   Kandice Hams   Authorized by:   Nolon Rod. Paz MD   Signed by:   Kandice Hams on 07/12/2009   Method used:   Faxed to ...       [facility] [address] [phone]* (retail)       [address]       [place], [place]  [address]       Ph: [phone] or [phone]       Fax: [phone]   RxID:   [id number]

## 2010-05-21 ENCOUNTER — Ambulatory Visit (INDEPENDENT_AMBULATORY_CARE_PROVIDER_SITE_OTHER): Payer: 59 | Admitting: Family Medicine

## 2010-05-21 ENCOUNTER — Encounter: Payer: Self-pay | Admitting: Family Medicine

## 2010-05-21 DIAGNOSIS — K5289 Other specified noninfective gastroenteritis and colitis: Secondary | ICD-10-CM | POA: Insufficient documentation

## 2010-05-30 NOTE — Assessment & Plan Note (Signed)
Summary: DIARRHEA,VOMITING,ABD PAIN/RH.....   Vital Signs:  Patient profile:   45 year old male Height:      76 inches Weight:      358 pounds BMI:     43.73 Temp:     98.4 degrees F oral BP sitting:   116 / 76  (left arm)  Vitals Entered By: Doristine Devoid CMA (May 21, 2010 9:39 AM) CC: NVD x1 day    History of Present Illness: 45 yo man here today for N/V/D.  daughter recently had GI bug.  sxs started yesterday w/ vomiting and diarrhea.  able to drink fluids.  held down meds this morning.  hasn't taken any immodium.  having 'fire like' pain in stomach and throat.  Current Medications (verified): 1)  Niaspan 1000 Mg Tbcr (Niacin (Antihyperlipidemic)) .... Take 1 Tablet By Mouth Once A Day 2)  Pravachol 40 Mg Tabs (Pravastatin Sodium) .Marland Kitchen.. 1 By Mouth At Bedtime 3)  Actos 45 Mg Tabs (Pioglitazone Hcl) .Marland Kitchen.. 1 By Mouth Qd 4)  Metformin Hcl 1000 Mg Tabs (Metformin Hcl) .Marland Kitchen.. 1 1/2 By Mouth Two Times A Day 5)  Losartan Potassium-Hctz 100-12.5 Mg Tabs (Losartan Potassium-Hctz) .Marland Kitchen.. 1 By Mouth Once Daily 6)  Baby Aspirin 81 Mg  Chew (Aspirin) .Marland Kitchen.. 1 By Mouth Once Daily 7)  Etodolac 500 Mg Tabs (Etodolac) .Marland Kitchen.. 1 Two Times A Day As Needed For Pain- Take With Food 8)  One Touch Ultra 2 Lancets .... Checks Bs Once Daily Dx 250.00 9)  One Touch Ultra 2 Test Strips .... Checks Bs Once Daily Dx 250.00  Allergies (verified): 1)  * Penicillins Group  Review of Systems      See HPI  Physical Exam  General:  morbidly obese but NAD Mouth:  MMM Lungs:  normal respiratory effort, no intercostal retractions, no accessory muscle use, and normal breath sounds.   Heart:  Normal rate and regular rhythm. S1 and S2 normal without gallop, murmur, click, rub or other extra sounds. Abdomen:  soft, NT/ND, +BS no rebound or guarding.   Impression & Recommendations:  Problem # 1:  GASTROENTERITIS (ICD-558.9) Assessment New pt's sxs and recent sick contacts consistent w/ viral illness.  phenergan as  needed.  immodium.  stressed fluid intake.  reviewed supportive care and red flags that should prompt return.  Pt expresses understanding and is in agreement w/ this plan.  Complete Medication List: 1)  Niaspan 1000 Mg Tbcr (Niacin (antihyperlipidemic)) .... Take 1 tablet by mouth once a day 2)  Pravachol 40 Mg Tabs (Pravastatin sodium) .Marland Kitchen.. 1 by mouth at bedtime 3)  Actos 45 Mg Tabs (Pioglitazone hcl) .Marland Kitchen.. 1 by mouth qd 4)  Metformin Hcl 1000 Mg Tabs (Metformin hcl) .Marland Kitchen.. 1 1/2 by mouth two times a day 5)  Losartan Potassium-hctz 100-12.5 Mg Tabs (Losartan potassium-hctz) .Marland Kitchen.. 1 by mouth once daily 6)  Baby Aspirin 81 Mg Chew (Aspirin) .Marland Kitchen.. 1 by mouth once daily 7)  Etodolac 500 Mg Tabs (Etodolac) .Marland Kitchen.. 1 two times a day as needed for pain- take with food 8)  One Touch Ultra 2 Lancets  .... Checks bs once daily dx 250.00 9)  One Touch Ultra 2 Test Strips  .... Checks bs once daily dx 250.00 10)  Promethazine Hcl 25 Mg Tabs (Promethazine hcl) .Marland Kitchen.. 1 tab by mouth q6 as needed for nausea  Patient Instructions: 1)  You appear to have the GI bug that's going around 2)  Drink plenty of fluids! 3)  Take the promethazine as  needed for nausea 4)  Take immodium as needed for diarrhea 5)  REST! 6)  Call with any questions or concerns 7)  Hang in there!!! Prescriptions: PROMETHAZINE HCL 25 MG  TABS (PROMETHAZINE HCL) 1 tab by mouth Q6 as needed for nausea  #30 x 0   Entered and Authorized by:   Neena Rhymes MD   Signed by:   Neena Rhymes MD on 05/21/2010   Method used:   Electronically to        [facility] [address] [id number][facility] (retail)       [address]       [place], [place]  [address]       Ph: [phone] or [phone]       Fax: [phone]   RxID:   [id number]    Orders Added: 1)  Est. Patient Level III [36644]

## 2010-07-11 ENCOUNTER — Encounter: Payer: Self-pay | Admitting: Internal Medicine

## 2010-07-12 ENCOUNTER — Encounter: Payer: Self-pay | Admitting: Internal Medicine

## 2010-07-12 ENCOUNTER — Ambulatory Visit (INDEPENDENT_AMBULATORY_CARE_PROVIDER_SITE_OTHER): Payer: 59 | Admitting: Internal Medicine

## 2010-07-12 DIAGNOSIS — I10 Essential (primary) hypertension: Secondary | ICD-10-CM

## 2010-07-12 DIAGNOSIS — E119 Type 2 diabetes mellitus without complications: Secondary | ICD-10-CM

## 2010-07-12 DIAGNOSIS — J309 Allergic rhinitis, unspecified: Secondary | ICD-10-CM

## 2010-07-12 DIAGNOSIS — E785 Hyperlipidemia, unspecified: Secondary | ICD-10-CM

## 2010-07-12 LAB — BASIC METABOLIC PANEL
BUN: 14 mg/dL (ref 6–23)
CO2: 28 mEq/L (ref 19–32)
Glucose, Bld: 115 mg/dL — ABNORMAL HIGH (ref 70–99)
Potassium: 3.9 mEq/L (ref 3.5–5.1)
Sodium: 139 mEq/L (ref 135–145)

## 2010-07-12 LAB — AST: AST: 21 U/L (ref 0–37)

## 2010-07-12 LAB — ALT: ALT: 28 U/L (ref 0–53)

## 2010-07-12 MED ORDER — ETODOLAC 500 MG PO TABS: 500.0000 mg | ORAL_TABLET | Freq: Two times a day (BID) | ORAL | Status: DC

## 2010-07-12 MED ORDER — FLUTICASONE PROPIONATE 50 MCG/ACT NA SUSP: 2.0000 | Freq: Every day | NASAL | Status: DC

## 2010-07-12 NOTE — Assessment & Plan Note (Addendum)
Due for a  fasting lipid profile

## 2010-07-12 NOTE — Assessment & Plan Note (Signed)
See history of present illness, had a reading of 260 but usually his CBGs are better. He is loosing weight. Labs

## 2010-07-12 NOTE — Assessment & Plan Note (Addendum)
Symptoms consistent with allergic rhinitis, add Flonase.

## 2010-07-12 NOTE — Assessment & Plan Note (Signed)
Due for a BMP. 

## 2010-07-12 NOTE — Progress Notes (Signed)
  Subjective:    Patient ID: [name], male    DOB: [birth year], 45 y.o.   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 Cholesterol, good medication compliance.   Past Medical History  Diagnosis Date  . Diabetes mellitus   . Hyperlipemia   . Hypertension   . Obesity     No past surgical history on file.  History   Social History  . Marital Status: Married    Spouse Name: N/A    Number of Children: 1  . Years of Education: N/A   Occupational History  . Not on file.   Social History Main Topics  . Smoking status: Never Smoker   . Smokeless tobacco: Not on file  . Alcohol Use: No  . Drug Use: Not on file  . Sexually Active: Not on file   Other Topics Concern  . Not on file   Social History Narrative   Remarried for the 3rd time w/ a lady [place]  very active at work, he is a Education administrator        Review of Systems Allergies? He is having sinus congestion, frontal headache for 6-8 weeks, has tried over-the-counter medicines without much help. Denies fevers or chills. +  itchy throat and eyes. Some cough with mild white sputum production. No nausea, vomiting, diarrhea    Objective:   Physical Exam  Constitutional: He appears well-developed.       His weight has decreased from 374 about 8 months ago to 359 pounds  HENT:  Head: Normocephalic and atraumatic.       TMs bilaterally congested and slightly bulge but no red or d/c , nose slightly congested. Throat without redness  Eyes: Conjunctivae are normal. Right eye exhibits no discharge. Left eye exhibits no discharge.  Neck: Normal range of motion.  Cardiovascular: Normal rate, regular rhythm and normal heart sounds.   Pulmonary/Chest: Effort normal and breath sounds normal. No respiratory  distress. He has no wheezes. He has no rales.          Assessment & Plan:

## 2010-07-15 ENCOUNTER — Telehealth: Payer: Self-pay | Admitting: Internal Medicine

## 2010-07-15 MED ORDER — GLIMEPIRIDE 2 MG PO TABS: 2.0000 mg | ORAL_TABLET | Freq: Every day | ORAL | Status: DC

## 2010-07-15 NOTE — Telephone Encounter (Signed)
Needs to go back to Pravachol, continue Niaspan

## 2010-07-15 NOTE — Telephone Encounter (Signed)
Pt is aware, he has not been taking Pravachol, he states he just ran out. Please advise.

## 2010-07-15 NOTE — Telephone Encounter (Signed)
Advised patient His diabetes used to be better, continue with same meds and add Amaryl 2 mg 1 by mouth q. day. I already  sent a prescription  Cholesterol is about the same. Is he taking Niaspan and Pravachol? Followup in 3 months [facility]

## 2010-07-16 MED ORDER — PRAVASTATIN SODIUM 40 MG PO TABS: 40.0000 mg | ORAL_TABLET | Freq: Every evening | ORAL | Status: DC

## 2010-07-16 NOTE — Telephone Encounter (Signed)
Pt aware of results 

## 2010-07-16 NOTE — Telephone Encounter (Signed)
Message left for patient to return my call. Sent Pravachol to pharm.

## 2010-07-17 ENCOUNTER — Telehealth: Payer: Self-pay | Admitting: Internal Medicine

## 2010-07-17 NOTE — Telephone Encounter (Signed)
Patient called to request that his last A1C reading from last week needs to be faxed to [facility] at [phone]

## 2010-07-17 NOTE — Telephone Encounter (Signed)
done

## 2010-07-18 ENCOUNTER — Telehealth: Payer: Self-pay | Admitting: *Deleted

## 2010-07-18 NOTE — Telephone Encounter (Signed)
Left message for pt to call back. Labs that he wanted faxed yesterday would not go through. Need to verify fax number.

## 2010-07-22 NOTE — Telephone Encounter (Signed)
Message left for patient to return my call.  

## 2010-09-08 ENCOUNTER — Emergency Department (HOSPITAL_BASED_OUTPATIENT_CLINIC_OR_DEPARTMENT_OTHER)
Admission: EM | Admit: 2010-09-08 | Discharge: 2010-09-08 | Disposition: A | Discharge status: Home / Self Care | Payer: 59 | Attending: Emergency Medicine | Admitting: Emergency Medicine

## 2010-09-08 DIAGNOSIS — M79609 Pain in unspecified limb: Secondary | ICD-10-CM | POA: Insufficient documentation

## 2010-09-08 DIAGNOSIS — E119 Type 2 diabetes mellitus without complications: Secondary | ICD-10-CM | POA: Insufficient documentation

## 2010-09-08 DIAGNOSIS — I10 Essential (primary) hypertension: Secondary | ICD-10-CM | POA: Insufficient documentation

## 2010-09-08 DIAGNOSIS — E785 Hyperlipidemia, unspecified: Secondary | ICD-10-CM | POA: Insufficient documentation

## 2010-09-10 ENCOUNTER — Encounter: Payer: Self-pay | Admitting: *Deleted

## 2010-09-10 ENCOUNTER — Encounter: Payer: Self-pay | Admitting: Internal Medicine

## 2010-09-10 ENCOUNTER — Ambulatory Visit (INDEPENDENT_AMBULATORY_CARE_PROVIDER_SITE_OTHER): Payer: 59 | Admitting: Internal Medicine

## 2010-09-10 DIAGNOSIS — M7660 Achilles tendinitis, unspecified leg: Secondary | ICD-10-CM

## 2010-09-10 NOTE — Assessment & Plan Note (Addendum)
Achilles tendinitis getting better with conservative treatment. I recommend him to discontinue Motrin and stick with lodine only.  Continue to use the boot and crutches as needed. Work excuse for today and tomorrow provided. He will let me know if no better. On further questioning, he did take an unusual up-a-hill walk  one day prior to onset of symptoms, that activity may be the culprit of tendinitis

## 2010-09-10 NOTE — Progress Notes (Signed)
  Subjective:    Patient ID: [name], male    DOB: 04/16/1965, 45 y.o.   MRN: [id number]  HPI [name] to the ER May 27, chief complaint was heel pain, he had a small area of erythema at the distal Achilles tendon, was treated conservatively, tendinitis?Marland Kitchen No x-rays or labs were done  Past Medical History  Diagnosis Date  . Diabetes mellitus   . Hyperlipemia   . Hypertension   . Obesity    No past surgical history on file.  Review of Systems Overall feels better, swelling has decreased, still in the area is hurting and this is slightly red. He is taking Lorcet as prescribed, also taking Motrin and Lodine which he had from a previous Rx.   Objective:   Physical Exam Alert oriented no apparent distress calves are symmetric and nontender Right Achilles tendon normal. Left Achilles tendon seems intact to palpation, distal aspect --by the heel--is slightly red and mildly tenderness to touch. Vascular exam normal.      Assessment & Plan:

## 2010-09-17 ENCOUNTER — Encounter: Payer: Self-pay | Admitting: Internal Medicine

## 2010-11-11 ENCOUNTER — Encounter: Payer: 59 | Admitting: Internal Medicine

## 2011-03-12 ENCOUNTER — Other Ambulatory Visit: Payer: Self-pay | Admitting: Internal Medicine

## 2011-04-03 ENCOUNTER — Other Ambulatory Visit: Payer: Self-pay | Admitting: Internal Medicine

## 2011-04-12 ENCOUNTER — Other Ambulatory Visit: Payer: Self-pay | Admitting: Internal Medicine

## 2011-04-14 NOTE — Telephone Encounter (Signed)
Ok 1 months supply, he is overdue for a office visit. Please call and make an appointment

## 2011-06-09 ENCOUNTER — Other Ambulatory Visit: Payer: Self-pay | Admitting: Internal Medicine

## 2011-09-01 ENCOUNTER — Other Ambulatory Visit: Payer: Self-pay | Admitting: Internal Medicine

## 2011-09-01 NOTE — Telephone Encounter (Signed)
Refill done. Office visit for further refills.

## 2011-09-20 ENCOUNTER — Encounter (HOSPITAL_BASED_OUTPATIENT_CLINIC_OR_DEPARTMENT_OTHER): Payer: Self-pay | Admitting: *Deleted

## 2011-09-20 ENCOUNTER — Emergency Department (HOSPITAL_BASED_OUTPATIENT_CLINIC_OR_DEPARTMENT_OTHER)
Admission: EM | Admit: 2011-09-20 | Discharge: 2011-09-20 | Disposition: A | Discharge status: Home / Self Care | Payer: 59 | Attending: Emergency Medicine | Admitting: Emergency Medicine

## 2011-09-20 DIAGNOSIS — I10 Essential (primary) hypertension: Secondary | ICD-10-CM | POA: Insufficient documentation

## 2011-09-20 DIAGNOSIS — M549 Dorsalgia, unspecified: Secondary | ICD-10-CM

## 2011-09-20 DIAGNOSIS — Z880 Allergy status to penicillin: Secondary | ICD-10-CM | POA: Insufficient documentation

## 2011-09-20 DIAGNOSIS — E785 Hyperlipidemia, unspecified: Secondary | ICD-10-CM | POA: Insufficient documentation

## 2011-09-20 DIAGNOSIS — M545 Low back pain, unspecified: Secondary | ICD-10-CM | POA: Insufficient documentation

## 2011-09-20 DIAGNOSIS — E119 Type 2 diabetes mellitus without complications: Secondary | ICD-10-CM | POA: Insufficient documentation

## 2011-09-20 MED ORDER — HYDROCODONE-ACETAMINOPHEN 5-325 MG PO TABS: 2.0000 | ORAL_TABLET | ORAL | Status: AC | PRN

## 2011-09-20 MED ORDER — CYCLOBENZAPRINE HCL 10 MG PO TABS: 10.0000 mg | ORAL_TABLET | Freq: Two times a day (BID) | ORAL | Status: AC | PRN

## 2011-09-20 MED ORDER — HYDROCODONE-ACETAMINOPHEN 5-325 MG PO TABS
2.0000 | ORAL_TABLET | Freq: Once | ORAL | Status: AC
  Administered 2011-09-20: 2 via ORAL
  Filled 2011-09-20: qty 2

## 2011-09-20 NOTE — ED Provider Notes (Signed)
History  This chart was scribed for [employer] by Cherlynn Perches. The patient was seen in room [facility]HH2/[facility]2. Patient's care was started at 2021.  CSN: [id number]  Arrival date & time 09/20/11  2021   First MD Initiated Contact with Patient 09/20/11 2201      Chief Complaint  Patient presents with  . Back Pain    (Consider location/radiation/quality/duration/timing/severity/associated sxs/prior treatment) HPI  SHEDDRICK LATTANZIO is a 46 y.o. male with a h/o diabetes, hyperlipidemia, and HTN who presents to the Emergency Department complaining of 8 days of sudden onset, gradually worsening, constant back pain localized to the lower back. Pt states that he was moving furniture when symptoms began. 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 Pt denies any associated factors including numbness, leg pain, dysuria, and increased urination. Pt denies a history of chronic back pain. Pt denies smoking and alcohol use.    Past Medical History  Diagnosis Date  . Diabetes mellitus   . Hyperlipemia   . Hypertension   . Obesity     History reviewed. No pertinent past surgical history.  Family History  Problem Relation Age of Onset  . Hypertension Neg Hx   . Coronary artery disease Neg Hx   . Colon cancer Neg Hx   . Prostate cancer Neg Hx   . Diabetes Neg Hx     History  Substance Use Topics  . Smoking status: Never Smoker   . Smokeless tobacco: Not on file  . Alcohol Use: No      Review of Systems  Constitutional: Negative for fever and chills.  HENT: Negative for ear pain and neck pain.   Respiratory: Negative for cough and shortness of breath.   Cardiovascular: Negative for chest pain.  Gastrointestinal: Negative for nausea, vomiting and abdominal pain.  Genitourinary: Negative for dysuria and hematuria.  Musculoskeletal: Positive for back pain. Negative for gait problem.  All other systems reviewed and are  negative.    Allergies  Penicillins  Home Medications   Current Outpatient Rx  Name Route Sig Dispense Refill  . ASPIRIN 81 MG PO TABS Oral Take 81 mg by mouth daily.      . ETODOLAC 500 MG PO TABS  TAKE 1 TABLET TWICE DAILY AS NEEDED FOR PAIN. TAKE WITH FOOD 60 tablet 0    Office visit due now.  Marland Kitchen GLIMEPIRIDE 2 MG PO TABS  TAKE 1 TABLET (2 MG TOTAL) BY MOUTH DAILY BEFORE BREAKFAST. 30 tablet 0    Office visit due now.  Marland Kitchen GLUCOSE BLOOD VI STRP Other 1 each by Other route daily. Dx 250.00     . LOSARTAN POTASSIUM-HCTZ 100-12.5 MG PO TABS  TAKE 1 TABLET BY MOUTH EVERY DAY 30 tablet 0    Office visit due now.  . METFORMIN HCL 1000 MG PO TABS  TAKE 1 AND 1/2 TABLETS BY MOUTH 2 TIMES A DAY 270 tablet 0  . NIACIN ER (ANTIHYPERLIPIDEMIC) 1000 MG PO TBCR Oral Take 1,000 mg by mouth daily.      Letta Pate DELICA LANCETS MISC Does not apply by Does not apply route. Daily dx 250.00     . PIOGLITAZONE HCL 45 MG PO TABS  TAKE 1 TABLET BY MOUTH DAILY 30 tablet 0    Office visit due now.  Marland Kitchen PRAVASTATIN SODIUM 40 MG PO TABS  TAKE 1 TABLET BY MOUTH ONCE EVERY EVENING 30 tablet 0  Office visit due now.  Marland Kitchen FLUTICASONE PROPIONATE 50 MCG/ACT NA SUSP Nasal 2 sprays by Nasal route daily. 16 g 6    Triage Vitals: BP 176/95  Pulse 94  Temp(Src) 97.7 F (36.5 C) (Oral)  Resp 20  Ht 6\' 4"  (1.93 m)  Wt 360 lb (163.295 kg)  BMI 43.82 kg/m2  SpO2 98%  Physical Exam  ED Course  Procedures (including critical care time)  DIAGNOSTIC STUDIES: Oxygen Saturation is 98% on room air, normal by my interpretation.    COORDINATION OF CARE: 10:00PM - Will discharge pt with prescription for muscle relaxer. Advised pt to follow up with orthopaedist if pain is not relieved in the next week. Pt agrees.   1. Back pain       MDM  Pt given rx for flexeril and hydrocodone       I personally performed the services in this documentation, which was scribed in my presence.  The recorded information has  been reviewed and considered.   Barnet Pall.   Lonia Skinner [place], [place] 09/20/11 2313

## 2011-09-20 NOTE — ED Notes (Signed)
Pt states he was moving furniture last Friday and ? Injured his lower back. 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

## 2011-09-20 NOTE — Discharge Instructions (Signed)
Back Pain, Adult Low back pain is very common. About 1 in 5 people have back pain.The cause of low back pain is rarely dangerous. The pain often gets better over time.About half of people with a sudden onset of back pain feel better in just 2 weeks. About 8 in 10 people feel better by 6 weeks.  CAUSES Some common causes of back pain include:  Strain of the muscles or ligaments supporting the spine.   Wear and tear (degeneration) of the spinal discs.   Arthritis.   Direct injury to the back.  DIAGNOSIS Most of the time, the direct cause of low back pain is not known.However, back pain can be treated effectively even when the exact cause of the pain is unknown.Answering your caregiver's questions about your overall health and symptoms is one of the most accurate ways to make sure the cause of your pain is not dangerous. If your caregiver needs more information, he or she may order lab work or imaging tests (X-rays or MRIs).However, even if imaging tests show changes in your back, this usually does not require surgery. HOME CARE INSTRUCTIONS For many people, back pain returns.Since low back pain is rarely dangerous, it is often a condition that people can learn to manageon their own.   Remain active. It is stressful on the back to sit or stand in one place. Do not sit, drive, or stand in one place for more than 30 minutes at a time. Take short walks on level surfaces as soon as pain allows.Try to increase the length of time you walk each day.   Do not stay in bed.Resting more than 1 or 2 days can delay your recovery.   Do not avoid exercise or work.Your body is made to move.It is not dangerous to be active, even though your back may hurt.Your back will likely heal faster if you return to being active before your pain is gone.   Pay attention to your body when you bend and lift. Many people have less discomfortwhen lifting if they bend their knees, keep the load close to their  bodies,and avoid twisting. Often, the most comfortable positions are those that put less stress on your recovering back.   Find a comfortable position to sleep. Use a firm mattress and lie on your side with your knees slightly bent. If you lie on your back, put a pillow under your knees.   Only take over-the-counter or prescription medicines as directed by your caregiver. Over-the-counter medicines to reduce pain and inflammation are often the most helpful.Your caregiver may prescribe muscle relaxant drugs.These medicines help dull your pain so you can more quickly return to your normal activities and healthy exercise.   Put ice on the injured area.   Put ice in a plastic bag.   Place a towel between your skin and the bag.   Leave the ice on for 15 to 20 minutes, 3 to 4 times a day for the first 2 to 3 days. After that, ice and heat may be alternated to reduce pain and spasms.   Ask your caregiver about trying back exercises and gentle massage. This may be of some benefit.   Avoid feeling anxious or stressed.Stress increases muscle tension and can worsen back pain.It is important to recognize when you are anxious or stressed and learn ways to manage it.Exercise is a great option.  SEEK MEDICAL CARE IF:  You have pain that is not relieved with rest or medicine.   You have   pain that does not improve in 1 week.   You have new symptoms.   You are generally not feeling well.  SEEK IMMEDIATE MEDICAL CARE IF:   You have pain that radiates from your back into your legs.   You develop new bowel or bladder control problems.   You have unusual weakness or numbness in your arms or legs.   You develop nausea or vomiting.   You develop abdominal pain.   You feel faint.  Document Released: 03/31/2005 Document Revised: 03/20/2011 Document Reviewed: 08/19/2010 [employer] Patient Information 2012 [employer]. 

## 2011-09-23 NOTE — ED Provider Notes (Signed)
Medical screening examination/treatment/procedure(s) were performed by non-physician practitioner and as supervising physician I was immediately available for consultation/collaboration.  Roby Donaway, MD 09/23/11 1220 

## 2011-10-24 ENCOUNTER — Other Ambulatory Visit: Payer: Self-pay | Admitting: Internal Medicine

## 2011-10-24 NOTE — Telephone Encounter (Signed)
Refill done.  

## 2011-10-26 ENCOUNTER — Other Ambulatory Visit: Payer: Self-pay | Admitting: Internal Medicine

## 2011-12-10 ENCOUNTER — Other Ambulatory Visit: Payer: Self-pay | Admitting: Internal Medicine

## 2011-12-10 NOTE — Telephone Encounter (Signed)
Refill done.  

## 2011-12-29 ENCOUNTER — Ambulatory Visit (INDEPENDENT_AMBULATORY_CARE_PROVIDER_SITE_OTHER): Payer: 59 | Admitting: Internal Medicine

## 2011-12-29 ENCOUNTER — Encounter: Payer: Self-pay | Admitting: Internal Medicine

## 2011-12-29 VITALS — BP 132/88 | HR 88 | Temp 97.4°F | Ht 74.5 in | Wt 369.0 lb

## 2011-12-29 DIAGNOSIS — Z23 Encounter for immunization: Secondary | ICD-10-CM

## 2011-12-29 DIAGNOSIS — E669 Obesity, unspecified: Secondary | ICD-10-CM

## 2011-12-29 DIAGNOSIS — E119 Type 2 diabetes mellitus without complications: Secondary | ICD-10-CM

## 2011-12-29 DIAGNOSIS — Z0000 Encounter for general adult medical examination without abnormal findings: Secondary | ICD-10-CM

## 2011-12-29 LAB — CBC WITH DIFFERENTIAL/PLATELET
Basophils Relative: 0.5 % (ref 0.0–3.0)
Eosinophils Relative: 3.8 % (ref 0.0–5.0)
HCT: 45.9 % (ref 39.0–52.0)
Lymphs Abs: 1.3 10*3/uL (ref 0.7–4.0)
MCV: 92.1 fl (ref 78.0–100.0)
Monocytes Absolute: 0.4 10*3/uL (ref 0.1–1.0)
Platelets: 208 10*3/uL (ref 150.0–400.0)
RBC: 4.99 Mil/uL (ref 4.22–5.81)
WBC: 6.3 10*3/uL (ref 4.5–10.5)

## 2011-12-29 LAB — COMPREHENSIVE METABOLIC PANEL
Alkaline Phosphatase: 53 U/L (ref 39–117)
Glucose, Bld: 83 mg/dL (ref 70–99)
Sodium: 136 mEq/L (ref 135–145)
Total Bilirubin: 0.8 mg/dL (ref 0.3–1.2)
Total Protein: 7.4 g/dL (ref 6.0–8.3)

## 2011-12-29 LAB — LIPID PANEL
Total CHOL/HDL Ratio: 5
VLDL: 52.8 mg/dL — ABNORMAL HIGH (ref 0.0–40.0)

## 2011-12-29 LAB — LDL CHOLESTEROL, DIRECT: Direct LDL: 95 mg/dL

## 2011-12-29 MED ORDER — LOSARTAN POTASSIUM-HCTZ 100-12.5 MG PO TABS: 1.0000 | ORAL_TABLET | Freq: Every day | ORAL | Status: DC

## 2011-12-29 MED ORDER — ETODOLAC 500 MG PO TABS: 500.0000 mg | ORAL_TABLET | Freq: Two times a day (BID) | ORAL | Status: DC | PRN

## 2011-12-29 MED ORDER — PIOGLITAZONE HCL 45 MG PO TABS: 45.0000 mg | ORAL_TABLET | Freq: Every day | ORAL | Status: DC

## 2011-12-29 MED ORDER — METFORMIN HCL 1000 MG PO TABS: ORAL_TABLET | ORAL | Status: DC

## 2011-12-29 MED ORDER — GLIMEPIRIDE 2 MG PO TABS: 2.0000 mg | ORAL_TABLET | Freq: Every day | ORAL | Status: DC

## 2011-12-29 MED ORDER — PRAVASTATIN SODIUM 40 MG PO TABS: 40.0000 mg | ORAL_TABLET | Freq: Every day | ORAL | Status: DC

## 2011-12-29 MED ORDER — NIACIN ER (ANTIHYPERLIPIDEMIC) 1000 MG PO TBCR: 1000.0000 mg | EXTENDED_RELEASE_TABLET | Freq: Every day | ORAL | Status: DC

## 2011-12-29 NOTE — Patient Instructions (Addendum)
Weight Watchers? [employer]? Nutritionist referral?

## 2011-12-29 NOTE — Assessment & Plan Note (Addendum)
His weight remains his main challenge, BMI 46. In no uncertain terms, I told the patient that this will create more problems in the future and a premature death. Once I have the  Labs back, I'll  strongly considered victoza instead of Actos We also discussed diet: Weight Watchers? [employer]? Nutritionist referral? If he tries and is unable to lose weight, will discuss  bariatric surgery.

## 2011-12-29 NOTE — Assessment & Plan Note (Addendum)
Reports good medication compliance, labs I have not been able to see him regularly, encourage ROVs. He would be a great candidate for victoza, I will wait for his labs

## 2011-12-29 NOTE — Progress Notes (Signed)
  Subjective:    Patient ID: [name], male    DOB: 1966-02-21, 46 y.o.   MRN: [id number]  HPI Complete physical exam  Past Medical History: DIABETES MELLITUS, TYPE II (ICD-250.00) HYPERLIPIDEMIA--High TG HYPERTENSION  OBESITY   Past Surgical History: no surgeries   Family History: hypertension--no coronary artery disease--no colon cancer-- no prostate cancer--no diabetes--M  Social History: remarried for the  3rd time with a lady from the  [place] 2 children  Occupation: Education administrator diet--  Needs improvement per pt  exercise-- very active at work , walks 30 min a day tobacco-- no ETOH--no   Review of Systems In general feels well Reports good compliance with all medicines. has diabetes, no recent CBGs but they were around 130 when he was checking up to 2 months ago. No recent ambulatory BPs however his BP was normal at his DOT exam 06-2011 Denies chest pain or shortness of breath No nausea, vomiting, diarrhea or blood in the stools. No dysuria or gross hematuria.  Current Outpatient Rx  Name Route Sig Dispense Refill  . ASPIRIN 81 MG PO TABS Oral Take 81 mg by mouth daily.      . ETODOLAC 500 MG PO TABS Oral Take 1 tablet (500 mg total) by mouth 2 (two) times daily as needed. 60 tablet 1  . GLIMEPIRIDE 2 MG PO TABS Oral Take 1 tablet (2 mg total) by mouth daily before breakfast. 30 tablet 6  . GLUCOSE BLOOD VI STRP Other 1 each by Other route daily. Dx 250.00     . LOSARTAN POTASSIUM-HCTZ 100-12.5 MG PO TABS  TAKE 1 TABLET BY MOUTH EVERY DAY 90 tablet 0  . LOSARTAN POTASSIUM-HCTZ 100-12.5 MG PO TABS Oral Take 1 tablet by mouth daily. 30 tablet 12  . METFORMIN HCL 1000 MG PO TABS  Take 1 1/2 tablets by mouth 2 times daily. 90 tablet 3  . NIACIN ER (ANTIHYPERLIPIDEMIC) 1000 MG PO TBCR Oral Take 1 tablet (1,000 mg total) by mouth daily. 30 tablet 3  . PIOGLITAZONE HCL 45 MG PO TABS Oral Take 1 tablet (45 mg total) by mouth daily. 30 tablet 3  . PRAVASTATIN  SODIUM 40 MG PO TABS Oral Take 1 tablet (40 mg total) by mouth daily. 30 tablet 3  . FLUTICASONE PROPIONATE 50 MCG/ACT NA SUSP Nasal 2 sprays by Nasal route daily. 16 g 6  . ONETOUCH DELICA LANCETS MISC Does not apply by Does not apply route. Daily dx 250.00          Objective:   Physical Exam General -- alert, well-developed, and morbidly overweight appearing. No apparent distress.  Neck --no thyromegaly Lungs -- normal respiratory effort, no intercostal retractions, no accessory muscle use, and normal breath sounds.   Heart-- normal rate, regular rhythm, no murmur, and no gallop.   Abdomen--soft, non-tender, no distention, no masses, no HSM, no guarding, and no rigidity.   Extremities-- no pretibial edema bilaterally  Neurologic-- alert & oriented X3 and strength normal in all extremities. Psych-- Cognition and judgment appear intact. Alert and cooperative with normal attention span and concentration.  not anxious appearing and not depressed appearing.      Assessment & Plan:

## 2011-12-29 NOTE — Assessment & Plan Note (Signed)
Td 2003 and today Flu shot today Labs  counseled  about diet exercise

## 2011-12-31 ENCOUNTER — Encounter: Payer: Self-pay | Admitting: *Deleted

## 2012-01-06 ENCOUNTER — Other Ambulatory Visit: Payer: Self-pay | Admitting: Internal Medicine

## 2012-01-06 NOTE — Telephone Encounter (Signed)
Confirmed with pharmacy, pt still has refills left on file. Refill request sent in error.

## 2012-01-12 ENCOUNTER — Other Ambulatory Visit: Payer: Self-pay | Admitting: Internal Medicine

## 2012-01-12 NOTE — Telephone Encounter (Signed)
Rx sent.    MW 

## 2012-03-29 ENCOUNTER — Ambulatory Visit: Payer: 59 | Admitting: Internal Medicine

## 2012-04-03 ENCOUNTER — Other Ambulatory Visit: Payer: Self-pay | Admitting: Internal Medicine

## 2012-04-05 ENCOUNTER — Other Ambulatory Visit: Payer: Self-pay | Admitting: Internal Medicine

## 2012-04-05 NOTE — Telephone Encounter (Signed)
Spoke with pharmacy. rx was sent in on 9.1.13 for 30-day supply. Re-sent rx for 90-day supply.

## 2012-04-05 NOTE — Telephone Encounter (Signed)
Refill done.  

## 2012-04-26 ENCOUNTER — Ambulatory Visit (INDEPENDENT_AMBULATORY_CARE_PROVIDER_SITE_OTHER): Payer: 59 | Admitting: Internal Medicine

## 2012-04-26 ENCOUNTER — Encounter: Payer: Self-pay | Admitting: Internal Medicine

## 2012-04-26 VITALS — BP 132/84 | HR 103 | Temp 97.4°F | Wt 359.0 lb

## 2012-04-26 DIAGNOSIS — E119 Type 2 diabetes mellitus without complications: Secondary | ICD-10-CM

## 2012-04-26 DIAGNOSIS — E669 Obesity, unspecified: Secondary | ICD-10-CM

## 2012-04-26 DIAGNOSIS — N529 Male erectile dysfunction, unspecified: Secondary | ICD-10-CM

## 2012-04-26 LAB — HEMOGLOBIN A1C: Hgb A1c MFr Bld: 8 % — ABNORMAL HIGH (ref 4.6–6.5)

## 2012-04-26 NOTE — Assessment & Plan Note (Addendum)
New problem, symptoms started a few months ago. Could be related to diabetes or other issues. Patient likes his testosterone checked, will do. Interpretation may be difficult due to morbid obesity. Vascular exam normal

## 2012-04-26 NOTE — Progress Notes (Signed)
  Subjective:    Patient ID: [name], male    DOB: 13-Oct-1965, 47 y.o.   MRN: [id number]  HPI Followup from previous visit. Since the last time he was here, he is taking his medications as recommended, ambulatory BPs normal. Has not been checking his blood sugars lately, thinks his glucometer is defective. Also complains of ED for the last few months, this is a new problem, difficulty with erections has been consistent and has a difficult time penetrating. Also admits to decreased libido. Would like his testosterone checked.  Past Medical History  Diagnosis Date  . Diabetes mellitus   . Hyperlipemia   . Hypertension   . Obesity    Past Surgical History  Procedure Date  . No past surgeries     History   Social History  . Marital Status: Married    Spouse Name: N/A    Number of Children: 1  . Years of Education: N/A   Occupational History  . Not on file.   Social History Main Topics  . Smoking status: Never Smoker   . Smokeless tobacco: Never Used  . Alcohol Use: No  . Drug Use: No  . Sexually Active: Not on file   Other Topics Concern  . Not on file   Social History Narrative   Remarried for the 3rd time [place]/ a [place]  very active at work, he is a Curator (tires)     Review of Systems Denies chest pain, shortness of breath. No nausea, vomiting, diarrhea. Denies symptoms consistent with low blood sugars. When asked, admits to snoring, some  fatigue and tired throughout the day. Gets slightly sleepy at around 2 PM most days. Otherwise no difficulty with falling asleep inappropriately. No claudication    Objective:   Physical Exam General -- alert, well-developed, and overweight appearing, has lost 10 pounds since the last time he was here to Lungs -- normal respiratory effort, no intercostal retractions, no accessory muscle use, and normal breath sounds.   Heart-- normal rate, regular rhythm, no murmur, and no gallop.   Normal  femoral pulses  DIABETIC FEET EXAM: No lower extremity edema Normal pedal pulses bilaterally Skin and nails are normal without calluses Pinprick examination of the feet normal. Psych-- Cognition and judgment appear intact. Alert and cooperative with normal attention span and concentration.  not anxious appearing and not depressed appearing.       Assessment & Plan:

## 2012-04-26 NOTE — Assessment & Plan Note (Signed)
Has decreased 10 pounds since the last time he was here, patient is praised. On further questioning, he snores quite a bit and feels fatigued. Few years ago reports a sleep study which was negative. Plan:  Continue with his efforts of losing weight. Sleep study.

## 2012-04-26 NOTE — Assessment & Plan Note (Addendum)
Last A1c was actually very good at < 6.0. No symptoms of low blood sugar. Feet exam (-) Plan: Check A1c, consider decrease Amaryl; new glucometer provided, CBG goals discussed

## 2012-04-27 LAB — TESTOSTERONE, FREE, TOTAL, SHBG
Sex Hormone Binding: 20 nmol/L (ref 13–71)
Testosterone-% Free: 2.5 % (ref 1.6–2.9)
Testosterone: 260.86 ng/dL — ABNORMAL LOW (ref 300–890)

## 2012-05-03 ENCOUNTER — Ambulatory Visit (INDEPENDENT_AMBULATORY_CARE_PROVIDER_SITE_OTHER): Payer: 59 | Admitting: Internal Medicine

## 2012-05-03 ENCOUNTER — Encounter: Payer: Self-pay | Admitting: Internal Medicine

## 2012-05-03 VITALS — BP 116/74 | HR 95 | Temp 98.1°F | Wt 359.0 lb

## 2012-05-03 DIAGNOSIS — N529 Male erectile dysfunction, unspecified: Secondary | ICD-10-CM

## 2012-05-03 DIAGNOSIS — E119 Type 2 diabetes mellitus without complications: Secondary | ICD-10-CM

## 2012-05-03 DIAGNOSIS — E785 Hyperlipidemia, unspecified: Secondary | ICD-10-CM

## 2012-05-03 MED ORDER — GLIMEPIRIDE 2 MG PO TABS: 2.0000 mg | ORAL_TABLET | Freq: Every day | ORAL | Status: DC

## 2012-05-03 MED ORDER — TADALAFIL 20 MG PO TABS: 10.0000 mg | ORAL_TABLET | ORAL | Status: DC | PRN

## 2012-05-03 NOTE — Patient Instructions (Addendum)
Follow up by April as planned

## 2012-05-03 NOTE — Assessment & Plan Note (Signed)
We discussed his testosterone results, total testosterone was slightly low, free testosterone was normal. At this point I don't recommend HRT. We talked about treatment for ED, in the past he didn't like viagra much consequently I provided samples and a prescription for Cialis. How to use it and side effects discussed

## 2012-05-03 NOTE — Assessment & Plan Note (Addendum)
A1c came back 8.0. Today, he reports that he has not been taking medications as prescribed, specifically he has not been taking Amaryl. Also reports his diet has not been the best in the last few weeks. He does not like to take any new medications at this point. Plan: Continue with all medications, call for refills if needed. Work on diet and exercise Follow up in April

## 2012-05-03 NOTE — Assessment & Plan Note (Signed)
Reports he has not been taking Niaspan lately because  they his skin burn like a rash. Plan: Decreased Niaspan to half tablet daily. Reassess on return to the office

## 2012-05-03 NOTE — Progress Notes (Signed)
  Subjective:    Patient ID: [name], male    DOB: [birth year], 47 y.o.   MRN: [id number]  HPI Here to discuss he is A1c of 8.0. Today he reports that he has not been taking the medication as prescribed, he ran out of meds early in January, he actually has not taking glimepiride  as he thought we discontinued it because there was no refill in the pharmacy. He also inquires about his testosterone results, continue with problems with ED.  Past Medical History  Diagnosis Date  . Diabetes mellitus   . Hyperlipemia   . Hypertension   . Obesity    Past Surgical History  Procedure Date  . No past surgeries      Review of Systems     Objective:   Physical Exam General -- alert, well-developed, morbidly obese gentleman in no distress.   Psych-- Cognition and judgment appear intact. Alert and cooperative with normal attention span and concentration.  not anxious appearing and not depressed appearing.       Assessment & Plan:  Today , I spent more than 15  min with the patient, >50% of the time counseling

## 2012-05-06 ENCOUNTER — Ambulatory Visit (INDEPENDENT_AMBULATORY_CARE_PROVIDER_SITE_OTHER): Payer: 59 | Admitting: Pulmonary Disease

## 2012-05-06 DIAGNOSIS — G4733 Obstructive sleep apnea (adult) (pediatric): Secondary | ICD-10-CM

## 2012-05-06 DIAGNOSIS — E669 Obesity, unspecified: Secondary | ICD-10-CM

## 2012-05-07 ENCOUNTER — Telehealth: Payer: Self-pay | Admitting: Internal Medicine

## 2012-05-07 DIAGNOSIS — G4730 Sleep apnea, unspecified: Secondary | ICD-10-CM

## 2012-05-07 MED ORDER — ONETOUCH ULTRASOFT LANCETS MISC: Status: DC

## 2012-05-07 MED ORDER — GLUCOSE BLOOD VI STRP: ORAL_STRIP | Status: DC

## 2012-05-07 NOTE — Telephone Encounter (Signed)
Advise patient, results show severe sleep apnea, most definitely needs a CPAP. Please arrange a referral to pulmonary, Dr. Shelle Iron

## 2012-05-07 NOTE — Telephone Encounter (Signed)
lmovm for pt to return call.  

## 2012-05-07 NOTE — Telephone Encounter (Signed)
Discussed with pt

## 2012-05-27 ENCOUNTER — Institutional Professional Consult (permissible substitution): Payer: 59 | Admitting: Pulmonary Disease

## 2012-06-14 ENCOUNTER — Encounter: Payer: Self-pay | Admitting: Pulmonary Disease

## 2012-06-14 ENCOUNTER — Ambulatory Visit (INDEPENDENT_AMBULATORY_CARE_PROVIDER_SITE_OTHER): Payer: 59 | Admitting: Pulmonary Disease

## 2012-06-14 VITALS — BP 130/80 | HR 91 | Temp 98.2°F | Ht 75.0 in | Wt 360.6 lb

## 2012-06-14 DIAGNOSIS — G4733 Obstructive sleep apnea (adult) (pediatric): Secondary | ICD-10-CM

## 2012-06-14 NOTE — Patient Instructions (Addendum)
Will start on cpap with moderate pressure.  Please call if having issues with tolerance. Work on weight loss followup with me in 6 weeks.

## 2012-06-14 NOTE — Progress Notes (Signed)
Subjective:    Patient ID: [name], male    DOB: 04-06-1966, 47 y.o.   MRN: [id number]  HPI The patient is a 47 year old male who I've been asked to see for management of obstructive sleep apnea.  He recently underwent sleep testing, where he was found to have an AHI of 51 events per hour with significant oxygen desaturation.  He has been noted to have loud snoring by his wife, as well as an abnormal breathing pattern during sleep.  He denies having frequent awakenings, but is under rested at least 50% of the mornings.  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  He also stays very active in the evenings caring for his children, and does not have time to get sleepy.  He denies any issues with sleepiness while driving.  The patient states that his weight is down 20 pounds over the last 2 years, and his Epworth score today is 10.  Sleep Questionnaire What time do you typically go to bed?( Between what hours) 1130-1230am 1130-1230am at 1421 on 06/14/12 by Nita Sells, CMA How long does it take you to fall asleep?  at 1421 on 06/14/12 by Marjo Bicker Mabe, CMA How many times during the night do you wake up? 2 2 at 1421 on 06/14/12 by Nita Sells, CMA What time do you get out of bed to start your day? 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 (In pounds) 20 lb (9.072 kg) 20 lb (9.072 kg) at 1421 on 06/14/12 by Nita Sells, CMA Have you ever had a sleep study before? Yes Yes at 1421 on 06/14/12 by Nita Sells, CMA If yes, location of study? [facility] sleep study 2014 [facility] 2014 at 1421 on 06/14/12 by Nita Sells,  CMA If yes, date of study? 2003 2003 at 1421 on 06/14/12 by Nita Sells, CMA Do you currently use CPAP? No No at 1421 on 06/14/12 by Marjo Bicker Mabe, CMA Do you wear oxygen at any time? No No at 1421 on 06/14/12 by Marjo Bicker Mabe, CMA    Review of Systems  Constitutional: Negative for fever and unexpected weight change.  HENT: Negative for ear pain, nosebleeds, congestion, sore throat, rhinorrhea, sneezing, trouble swallowing, dental problem, postnasal drip and sinus pressure.   Eyes: Negative for redness and itching.  Respiratory: Negative for cough, chest tightness, shortness of breath and wheezing.   Cardiovascular: Negative for palpitations and leg swelling.  Gastrointestinal: Negative for nausea and vomiting.  Genitourinary: Negative for dysuria.  Musculoskeletal: Negative for joint swelling.  Skin: Negative for rash.  Neurological: Negative for headaches.  Hematological: Does not bruise/bleed easily.  Psychiatric/Behavioral: Negative for dysphoric mood. The patient is not nervous/anxious.        Objective:   Physical Exam Constitutional:  Morbidly obese male, no acute distress  HENT:  Nares patent without discharge, mild septal deviation to the left  Oropharynx without exudate, palate and uvula are elongated and thickening, with soft tissue redundancy  Eyes:  Perrla, eomi, no scleral icterus  Neck:  No JVD, no TMG  Cardiovascular:  Normal rate, regular  rhythm, no rubs or gallops.  No murmurs        Intact distal pulses  Pulmonary :  Normal breath sounds, no stridor or respiratory distress   No rales, rhonchi, or wheezing  Abdominal:  Soft, nondistended, bowel sounds present.  No tenderness noted.   Musculoskeletal:  No lower extremity edema noted.  Lymph Nodes:  No cervical lymphadenopathy noted  Skin:  No cyanosis noted  Neurologic:  Alert, appropriate, moves all 4 extremities without obvious deficit.         Assessment & Plan:

## 2012-06-14 NOTE — Assessment & Plan Note (Signed)
The patient has severe obstructive sleep apnea by his recent sleep testing, and will greatly benefit from treatment with CPAP while he is working on weight loss.  I have discussed with him the pathophysiology of sleep apnea, including its impact to his quality of life and cardiovascular health.  He is willing to give CPAP a try, and we'll therefore start him on a moderate pressure level to help with desensitization.  I've also encouraged him to work aggressively on weight loss.

## 2012-06-17 ENCOUNTER — Other Ambulatory Visit: Payer: Self-pay | Admitting: Internal Medicine

## 2012-06-22 NOTE — Telephone Encounter (Signed)
Called the pharmacy and the script did not process because it was to early to refill. The pharmacy re ran the script and it went through..the pharmacy will contact the patient.

## 2012-06-28 ENCOUNTER — Ambulatory Visit (INDEPENDENT_AMBULATORY_CARE_PROVIDER_SITE_OTHER): Payer: 59 | Admitting: Internal Medicine

## 2012-06-28 ENCOUNTER — Encounter: Payer: Self-pay | Admitting: Internal Medicine

## 2012-06-28 VITALS — BP 122/76 | HR 108 | Wt 365.0 lb

## 2012-06-28 DIAGNOSIS — E119 Type 2 diabetes mellitus without complications: Secondary | ICD-10-CM

## 2012-06-28 DIAGNOSIS — I10 Essential (primary) hypertension: Secondary | ICD-10-CM

## 2012-06-28 DIAGNOSIS — Z136 Encounter for screening for cardiovascular disorders: Secondary | ICD-10-CM | POA: Insufficient documentation

## 2012-06-28 NOTE — Assessment & Plan Note (Signed)
We had a long conversation about diet today, Weight Watchers? [employer]?Marland Kitchen

## 2012-06-28 NOTE — Progress Notes (Signed)
  Subjective:    Patient ID: [name], male    DOB: 12-12-1965, 47 y.o.   MRN: [id number]  HPI Today we discussed the following issues: Had a screening few days ago at [facility], BP was slightly elevated at 151/90. Carotid ultrasound show a trace of plaque buildup on the right. Would like to schedule a cardiac scoring CT. Otherwise doing well. Good compliance with medication as prescribed.   Past Medical History  Diagnosis Date  . Diabetes mellitus   . Hyperlipemia   . Hypertension   . Obesity    Past Surgical History  Procedure Laterality Date  . No past surgeries       Review of Systems Denies any chest pain, shortness or breath. No nausea, vomiting, diarrhea. Diet is about the same, needs improvement .    Objective:   Physical Exam General -- alert, well-developed Lungs -- normal respiratory effort, no intercostal retractions, no accessory muscle use, and normal breath sounds.   Heart-- normal rate, regular rhythm, no murmur, and no gallop.    Neurologic-- alert & oriented X3 and strength normal in all extremities. Psych-- Cognition and judgment appear intact. Alert and cooperative with normal attention span and concentration.  not anxious appearing and not depressed appearing.       Assessment & Plan:  Today , I spent more than 15  min with the patient, >50% of the time counseling about diet.

## 2012-06-28 NOTE — Assessment & Plan Note (Addendum)
Recent cardiovascular screening showed a trace of cholesterol buildup in the right carotid artery. The patient would like to get a cardiac scoring CT at [facility] --->  will schedule it

## 2012-06-28 NOTE — Patient Instructions (Addendum)
Will schedule the heart test for you Think about weight Watchers and the [facility] Diet ! Come back next month

## 2012-06-28 NOTE — Assessment & Plan Note (Signed)
BP was a slightly elevated a few days ago, see history of present illness. BP today normal. No change

## 2012-07-09 ENCOUNTER — Telehealth: Payer: Self-pay | Admitting: Internal Medicine

## 2012-07-09 NOTE — Telephone Encounter (Signed)
Advise patient He is coronary calcium is not elevated. I recommend nevertheless a healthy diet, weight loss, and keep his diabetes, hypertension, high cholesterol well-controlled. Additionally, has a 3 mm right lung nodule. Since he is a nonsmoker, his risk for lung cancer is low consequently according to the report he does not need further eval. We can discuss more at the time of the next visit.

## 2012-07-13 NOTE — Telephone Encounter (Signed)
Discussed with pt

## 2012-07-20 ENCOUNTER — Encounter: Payer: Self-pay | Admitting: *Deleted

## 2012-07-20 ENCOUNTER — Ambulatory Visit (INDEPENDENT_AMBULATORY_CARE_PROVIDER_SITE_OTHER): Payer: 59 | Admitting: Internal Medicine

## 2012-07-20 ENCOUNTER — Encounter: Payer: Self-pay | Admitting: Internal Medicine

## 2012-07-20 VITALS — BP 124/80 | HR 86 | Temp 97.9°F | Wt 359.0 lb

## 2012-07-20 DIAGNOSIS — Z136 Encounter for screening for cardiovascular disorders: Secondary | ICD-10-CM

## 2012-07-20 DIAGNOSIS — R911 Solitary pulmonary nodule: Secondary | ICD-10-CM | POA: Insufficient documentation

## 2012-07-20 DIAGNOSIS — L299 Pruritus, unspecified: Secondary | ICD-10-CM

## 2012-07-20 MED ORDER — PERMETHRIN 5 % EX CREA: TOPICAL_CREAM | Freq: Once | CUTANEOUS | Status: DC

## 2012-07-20 NOTE — Patient Instructions (Addendum)
Apply the medicine from the jaw down at night, take a shower the next  morning your family needs to be treated at the same time If no better may need to repeat treatment in 1 week Wash all yours and your family  cloths, bed sheets    Scabies Scabies are small bugs (mites) that burrow under the skin and cause red bumps and severe itching. These bugs can only be seen with a microscope. Scabies are highly contagious. They can spread easily from person to person by direct contact. They are also spread through sharing clothing or linens that have the scabies mites living in them. It is not unusual for an entire family to become infected through shared towels, clothing, or bedding.  HOME CARE INSTRUCTIONS   Your caregiver may prescribe a cream or lotion to kill the mites. If cream is prescribed, massage the cream into the entire body from the neck to the bottom of both feet. Also massage the cream into the scalp and face if your child is less than 67 year old. Avoid the eyes and mouth. Do not wash your hands after application.  Leave the cream on for 8 to 12 hours. Your child should bathe or shower after the 8 to 12 hour application period. Sometimes it is helpful to apply the cream to your child right before bedtime.  One treatment is usually effective and will eliminate approximately 95% of infestations. For severe cases, your caregiver may decide to repeat the treatment in 1 week. Everyone in your household should be treated with one application of the cream.  New rashes or burrows should not appear within 24 to 48 hours after successful treatment. However, the itching and rash may last for 2 to 4 weeks after successful treatment. Your caregiver may prescribe a medicine to help with the itching or to help the rash go away more quickly.  Scabies can live on clothing or linens for up to 3 days. All of your child's recently used clothing, towels, stuffed toys, and bed linens should be washed in hot water  and then dried in a dryer for at least 20 minutes on high heat. Items that cannot be washed should be enclosed in a plastic bag for at least 3 days.  To help relieve itching, bathe your child in a cool bath or apply cool washcloths to the affected areas.  Your child may return to school after treatment with the prescribed cream. SEEK MEDICAL CARE IF:   The itching persists longer than 4 weeks after treatment.  The rash spreads or becomes infected. Signs of infection include red blisters or yellow-tan crust. Document Released: 03/31/2005 Document Revised: 06/23/2011 Document Reviewed: 08/09/2008 [facility] Patient Information 2013 [place], [place].

## 2012-07-20 NOTE — Assessment & Plan Note (Signed)
Coronary calcium score not elevated per recent scan

## 2012-07-20 NOTE — Progress Notes (Signed)
  Subjective:    Patient ID: [name], male    DOB: 15-Mar-1966, 47 y.o.   MRN: [id number]  HPI Acute visit 3 weeks history of generalized itching, his 2 children have the same, they saw the pediatrician and he felt it was scabies. Here for treatment. He has very  small bumps in the area where he itches.most affected area  the abdomen, between the legs and arms. The face is not affected Also likes to discuss recent scan. See assessment and plan  Past Medical History  Diagnosis Date  . Diabetes mellitus   . Hyperlipemia   . Hypertension   . Obesity    Past Surgical History  Procedure Laterality Date  . No past surgeries      Review of Systems No fever or chills, feels well otherwise.     Objective:   Physical Exam BP 124/80  Pulse 86  Temp(Src) 97.9 F (36.6 C) (Oral)  Wt 359 lb (162.841 kg)  BMI 44.87 kg/m2  SpO2 97% General -- alert, well-developed, no apparent distress  Except for scratching frequently Skin:  Mild erythema at the abdomen and arms, he has few very small red papular lesions (< 1 mm) scattered throughout the abdomen. Hands and wrists and normal Neurologic-- alert & oriented X3 and strength normal in all extremities. Psych-- Cognition and judgment appear intact. Alert and cooperative with normal attention span and concentration.  not anxious appearing and not depressed appearing.         Assessment & Plan:   Generalized pruritus in the setting of  His 2 children and actually his wife's affected with similar symptoms; despite the lack of classic rash he probably has scabies. Will recommend empiric treatment, see Instructions.

## 2012-07-20 NOTE — Assessment & Plan Note (Signed)
3 mm solitarynodule found at recent CT done for CAD screening. We discussed surveillance versus no further testing, I  personally feel more comfortable checking one additional CT to document stability, he is in agreement. Plan CT in one year.

## 2012-07-23 ENCOUNTER — Encounter: Payer: Self-pay | Admitting: Internal Medicine

## 2012-07-26 ENCOUNTER — Ambulatory Visit (INDEPENDENT_AMBULATORY_CARE_PROVIDER_SITE_OTHER): Payer: 59 | Admitting: Pulmonary Disease

## 2012-07-26 ENCOUNTER — Encounter: Payer: Self-pay | Admitting: Pulmonary Disease

## 2012-07-26 VITALS — BP 144/88 | HR 105 | Temp 97.3°F | Ht 76.0 in | Wt 355.0 lb

## 2012-07-26 DIAGNOSIS — G4733 Obstructive sleep apnea (adult) (pediatric): Secondary | ICD-10-CM

## 2012-07-26 NOTE — Assessment & Plan Note (Signed)
The patient overall has adjusted quite well to CPAP, however we need to optimize his pressure.  He is already seeing some difference in his sleep and daytime alertness, and is having no issues with his mask fit.  I have asked him to keep up with his supplies and mask changes, and to work aggressively on weight loss.  We will let him know the results of his auto titration study.   Care Plan:  At this point, will arrange for the patient's machine to be changed over to auto mode for 2 weeks to optimize their pressure.  I will review the downloaded data once sent by dme, and also evaluate for compliance, leaks, and residual osa.  I will call the patient and dme to discuss the results, and have the patient's machine set appropriately.  This will serve as the pt's cpap pressure titration.

## 2012-07-26 NOTE — Patient Instructions (Addendum)
Will have your machine put on the auto setting to optimize your pressure.  Will let you know the results after we get your download.  Let us know if you wish to stay on auto. Work on weight loss followup with me in 6mos, but call if having tolerance issues.

## 2012-07-26 NOTE — Progress Notes (Signed)
  Subjective:    Patient ID: [name], male    DOB: 02-12-1966, 47 y.o.   MRN: [id number]  HPI The patient comes in today for followup of his obstructive sleep apnea.  He is wearing CPAP compliantly, and is having no issues with his mask fit.  He is having some issues with pressure tolerates, and feels that he is not getting enough air.  He will awaken during the night with feeling of gasping and pull the mask off for a short period of time.  I have explained to him that we have yet to optimize his CPAP pressure.   Review of Systems  Constitutional: Negative for fever and unexpected weight change.  HENT: Positive for congestion ( allergies), sneezing and sinus pressure. Negative for ear pain, nosebleeds, sore throat, rhinorrhea, trouble swallowing, dental problem and postnasal drip.   Eyes: Negative for redness and itching.  Respiratory: Negative for cough, chest tightness, shortness of breath and wheezing.   Cardiovascular: Negative for palpitations and leg swelling.  Gastrointestinal: Negative for nausea and vomiting.  Genitourinary: Negative for dysuria.  Musculoskeletal: Negative for joint swelling.  Skin: Negative for rash.  Neurological: Positive for headaches.  Hematological: Does not bruise/bleed easily.  Psychiatric/Behavioral: Negative for dysphoric mood. The patient is not nervous/anxious.        Objective:   Physical Exam Morbidly obese male in no acute distress Nose without purulence or discharge noted No skin breakdown or pressure necrosis from the CPAP mask Neck without lymphadenopathy or thyromegaly Lower extremities with minimal edema, cyanosis Alert, does not appear to be sleepy, moves all 4 extremities.       Assessment & Plan:

## 2012-08-02 ENCOUNTER — Ambulatory Visit (INDEPENDENT_AMBULATORY_CARE_PROVIDER_SITE_OTHER): Payer: 59 | Admitting: Internal Medicine

## 2012-08-02 ENCOUNTER — Encounter: Payer: Self-pay | Admitting: Internal Medicine

## 2012-08-02 VITALS — BP 142/86 | HR 88 | Temp 97.6°F | Wt 361.0 lb

## 2012-08-02 DIAGNOSIS — E119 Type 2 diabetes mellitus without complications: Secondary | ICD-10-CM

## 2012-08-02 DIAGNOSIS — E785 Hyperlipidemia, unspecified: Secondary | ICD-10-CM

## 2012-08-02 DIAGNOSIS — I10 Essential (primary) hypertension: Secondary | ICD-10-CM

## 2012-08-02 LAB — LIPID PANEL
Cholesterol: 176 mg/dL (ref 0–200)
HDL: 25.1 mg/dL — ABNORMAL LOW (ref >39.00)
Total CHOL/HDL Ratio: 7
Triglycerides: 310 mg/dL — ABNORMAL HIGH (ref 0.0–149.0)
VLDL: 62 mg/dL — ABNORMAL HIGH (ref 0.0–40.0)

## 2012-08-02 LAB — BASIC METABOLIC PANEL
BUN: 20 mg/dL (ref 6–23)
Chloride: 102 mEq/L (ref 96–112)
Potassium: 3.7 mEq/L (ref 3.5–5.1)
Sodium: 138 mEq/L (ref 135–145)

## 2012-08-02 LAB — ALT: ALT: 20 U/L (ref 0–53)

## 2012-08-02 LAB — AST: AST: 18 U/L (ref 0–37)

## 2012-08-02 NOTE — Progress Notes (Signed)
  Subjective:    Patient ID: [name], male    DOB: [birth year], 47 y.o.   MRN: [id number]  HPI Routine office visit. Was seen with generalized itching, better. Diabetes, good medication compliance, has improve his diet (no sodas), CBGs now when checked are ~100 according to the patient. Occasionally in the afternoons he feels dizzy,Thinks it may be low sugar and gets "snickers bar". Hypertension, good medication compliance, BP [id number], at home BPs always less than 140. High cholesterol, good medication compliance.   Past Medical History  Diagnosis Date  . Diabetes mellitus   . Hyperlipemia   . Hypertension   . Obesity    Past Surgical History  Procedure Laterality Date  . No past surgeries     History   Social History  . Marital Status: Married    Spouse Name: N/A    Number of Children: 1  . Years of Education: N/A   Occupational History  . [employer]   Social History Main Topics  . Smoking status: Never Smoker   . Smokeless tobacco: Never Used  . Alcohol Use: No  . Drug Use: No  . Sexually Active: Not on file   Other Topics Concern  . Not on file   Social History Narrative   Remarried for the 3rd time w/ a lady for the [place]   Exercise-  very active at work, he is a Curator (tires)               Review of Systems No chest pain, shortness or breath or lower extremity edema. No nausea, vomiting, diarrhea.     Objective:   Physical Exam  General -- alert, well-developed,No apparent distress Lungs -- normal respiratory effort, no intercostal retractions, no accessory muscle use, and normal breath sounds.   Heart-- normal rate, regular rhythm, no murmur, and no gallop.   Extremities-- no pretibial edema bilaterally Neurologic-- alert & oriented X3 and strength normal in all extremities. Psych-- Cognition and judgment appear intact. Alert and cooperative with normal attention span and concentration.  not anxious appearing  and not depressed appearing.       Assessment & Plan:

## 2012-08-02 NOTE — Assessment & Plan Note (Signed)
Since the last time he was seen for diabetes, he is doing better with diet, CBGs are 100, occasionally feels dizzy in the afternoon on thinks may be related to low sugar. We reviewed the low sugar symptoms, advised to check his blood sugar if he feels unwell and take a healthy snack. See instructions. Reports a normal eye exam 2013

## 2012-08-02 NOTE — Assessment & Plan Note (Signed)
Ambulatory BPs always less than 140. No change. Check a BMP.

## 2012-08-02 NOTE — Patient Instructions (Addendum)
If you feel your sugar is low ,check it (if possible) and get a healthy snack Next visit 4 months  Continue with your healthier diet, exercise daily!   Hypoglycemia (Low Blood Sugar) Hypoglycemia is when the glucose (sugar) in your blood is too low. Hypoglycemia can happen for many reasons. It can happen to people with or without diabetes. Hypoglycemia can develop quickly and can be a medical emergency.  CAUSES  Having hypoglycemia does not mean that you will develop diabetes. Different causes include:  Missed or delayed meals or not enough carbohydrates eaten.  Medication overdose. This could be by accident or deliberate. If by accident, your medication may need to be adjusted or changed.  Exercise or increased activity without adjustments in carbohydrates or medications.  A nerve disorder that affects body functions like your heart rate, blood pressure and digestion (autonomic neuropathy).  A condition where the stomach muscles do not function properly (gastroparesis). Therefore, medications may not absorb properly.  The inability to recognize the signs of hypoglycemia (hypoglycemic unawareness).  Absorption of insulin  may be altered.  Alcohol consumption.  Pregnancy/menstrual cycles/postpartum. This may be due to hormones.  Certain kinds of tumors. This is very rare. SYMPTOMS   Sweating.  Hunger.  Dizziness.  Blurred vision.  Drowsiness.  Weakness.  Headache.  Rapid heart beat.  Shakiness.  Nervousness. DIAGNOSIS  Diagnosis is made by monitoring blood glucose in one or all of the following ways:  Fingerstick blood glucose monitoring.  Laboratory results. TREATMENT  If you think your blood glucose is low:  Check your blood glucose, if possible. If it is less than 70 mg/dl, take one of the following:  3-4 glucose tablets.   cup juice (prefer clear like apple).   cup "regular" soda pop.  1 cup milk.  -1 tube of glucose gel.  5-6 hard  candies.  Do not over treat because your blood glucose (sugar) will only go too high.  Wait 15 minutes and recheck your blood glucose. If it is still less than 70 mg/dl (or below your target range), repeat treatment.  Eat a snack if it is more than one hour until your next meal. Sometimes, your blood glucose may go so low that you are unable to treat yourself. You may need someone to help you. You may even pass out or be unable to swallow. This may require you to get an injection of glucagon, which raises the blood glucose. HOME CARE INSTRUCTIONS  Check blood glucose as recommended by your caregiver.  Take medication as prescribed by your caregiver.  Follow your meal plan. Do not skip meals. Eat on time.  If you are going to drink alcohol, drink it only with meals.  Check your blood glucose before driving.  Check your blood glucose before and after exercise. If you exercise longer or different than usual, be sure to check blood glucose more frequently.  Always carry treatment with you. Glucose tablets are the easiest to carry.  Always wear medical alert jewelry or carry some form of identification that states that you have diabetes. This will alert people that you have diabetes. If you have hypoglycemia, they will have a better idea on what to do. SEEK MEDICAL CARE IF:   You are having problems keeping your blood sugar at target range.  You are having frequent episodes of hypoglycemia.  You feel you might be having side effects from your medicines.  You have symptoms of an illness that is not improving after 3-4 days.  You notice a change in vision or a new problem with your vision. SEEK IMMEDIATE MEDICAL CARE IF:   You are a family member or friend of a person whose blood glucose goes below 70 mg/dl and is accompanied by:  Confusion.  A change in mental status.  The inability to swallow.  Passing out. Document Released: 03/31/2005 Document Revised: 06/23/2011 Document  Reviewed: 07/28/2011 [facility] Patient Information 2013 [place], [place].

## 2012-08-02 NOTE — Assessment & Plan Note (Signed)
Labs , good med compliance

## 2012-08-04 ENCOUNTER — Encounter: Payer: Self-pay | Admitting: *Deleted

## 2012-09-03 ENCOUNTER — Telehealth: Payer: Self-pay | Admitting: Pulmonary Disease

## 2012-09-03 NOTE — Telephone Encounter (Signed)
lmomtcb x1 for pt 

## 2012-09-07 NOTE — Telephone Encounter (Signed)
LMTCBx2. Carron Curie, CMA I ATC [employer] and line is busy. WCB to get download because I do not see this in the pt chart. Carron Curie, CMA

## 2012-09-08 NOTE — Telephone Encounter (Signed)
Pt is aware and download was received and placed in [facility] look-at folder. Pt wants a copy mailed to him once this is reviewed. Carron Curie, CMA

## 2012-09-08 NOTE — Telephone Encounter (Signed)
I called [employer] and the pt dropped off chip for download yesterday. They will fax over download to triage fax. Will await Fax. [employer] stated that the pt did not use the machine much in last 30 days. He only used it more then 4 hours 2 days. I LMTCBx3 to advise the pt that once [facility] reviews download we can give him a copy.

## 2012-09-10 ENCOUNTER — Other Ambulatory Visit: Payer: Self-pay | Admitting: Pulmonary Disease

## 2012-09-10 DIAGNOSIS — G4733 Obstructive sleep apnea (adult) (pediatric): Secondary | ICD-10-CM

## 2012-09-10 NOTE — Telephone Encounter (Signed)
Please let pt know his download shows optimal pressure of 15-16.  Will send an order to his dme to make this change.

## 2012-09-10 NOTE — Telephone Encounter (Signed)
LMTCB

## 2012-09-13 NOTE — Telephone Encounter (Signed)
LMTCB

## 2012-09-13 NOTE — Telephone Encounter (Signed)
Pt returned call.  States he will not be available until 2:30 or later today.  Antionette Fairy

## 2012-09-13 NOTE — Telephone Encounter (Signed)
Spoke with pt and notified of recs per [facility] Pt verbalized understanding and states nothing further needed

## 2012-09-13 NOTE — Telephone Encounter (Signed)
Returning call.[name] ° °

## 2012-09-13 NOTE — Telephone Encounter (Signed)
ATC patient, no answer LMOMTCB 

## 2012-11-26 ENCOUNTER — Other Ambulatory Visit: Payer: Self-pay | Admitting: Internal Medicine

## 2012-11-29 ENCOUNTER — Ambulatory Visit: Payer: 59 | Admitting: Internal Medicine

## 2012-11-29 NOTE — Telephone Encounter (Signed)
Refill done per orders.  

## 2012-11-29 NOTE — Telephone Encounter (Signed)
Ok to refill Lodine? Pt takes as needed.  Last OV 4.21.14 Last filled 9.16.13 #60 and 1 refill.

## 2012-11-29 NOTE — Telephone Encounter (Signed)
Ok 60, no RF 

## 2013-01-20 ENCOUNTER — Other Ambulatory Visit: Payer: Self-pay | Admitting: Internal Medicine

## 2013-01-21 NOTE — Telephone Encounter (Signed)
rx refilled per protocol. DJR  

## 2013-01-27 ENCOUNTER — Other Ambulatory Visit: Payer: Self-pay | Admitting: Internal Medicine

## 2013-01-27 NOTE — Telephone Encounter (Signed)
Metformin refill sent to pharmacy

## 2013-01-31 ENCOUNTER — Ambulatory Visit: Payer: 59 | Admitting: Pulmonary Disease

## 2013-03-04 ENCOUNTER — Other Ambulatory Visit: Payer: Self-pay | Admitting: Internal Medicine

## 2013-03-04 NOTE — Telephone Encounter (Signed)
Actos refilled per protocol. OV due

## 2013-04-04 ENCOUNTER — Other Ambulatory Visit: Payer: Self-pay | Admitting: Internal Medicine

## 2013-04-05 NOTE — Telephone Encounter (Signed)
rx refilled per protocol. DJR  

## 2013-04-26 ENCOUNTER — Encounter (HOSPITAL_COMMUNITY): Payer: Self-pay | Admitting: Emergency Medicine

## 2013-04-26 ENCOUNTER — Emergency Department (HOSPITAL_COMMUNITY)
Admission: EM | Admit: 2013-04-26 | Discharge: 2013-04-26 | Disposition: A | Discharge status: Home / Self Care | Payer: 59 | Source: Home / Self Care

## 2013-04-26 DIAGNOSIS — J309 Allergic rhinitis, unspecified: Secondary | ICD-10-CM

## 2013-04-26 DIAGNOSIS — J329 Chronic sinusitis, unspecified: Secondary | ICD-10-CM

## 2013-04-26 DIAGNOSIS — I10 Essential (primary) hypertension: Secondary | ICD-10-CM

## 2013-04-26 MED ORDER — AZITHROMYCIN 250 MG PO TABS
ORAL_TABLET | ORAL | Status: DC
Start: 2013-04-26 — End: 2013-04-26

## 2013-04-26 MED ORDER — AZITHROMYCIN 250 MG PO TABS: ORAL_TABLET | ORAL | Status: DC

## 2013-04-26 NOTE — ED Provider Notes (Signed)
Medical screening examination/treatment/procedure(s) were performed by non-physician practitioner and as supervising physician I was immediately available for consultation/collaboration.  Philipp Deputy, M.D.  Harden Mo, MD 04/26/13 2112

## 2013-04-26 NOTE — ED Notes (Signed)
C/o sinus pressure and pain.  Clogged nose.  Headache.  States "Overall just feel bad"  No relief with otc meds.  Symptoms present since yesterday.

## 2013-04-26 NOTE — Discharge Instructions (Signed)
Allergic Rhinitis Allergic rhinitis is when the mucous membranes in the nose respond to allergens. Allergens are particles in the air that cause your body to have an allergic reaction. This causes you to release allergic antibodies. Through a chain of events, these eventually cause you to release histamine into the blood stream. Although meant to protect the body, it is this release of histamine that causes your discomfort, such as frequent sneezing, congestion, and an itchy, runny nose.  CAUSES  Seasonal allergic rhinitis (hay fever) is caused by pollen allergens that may come from grasses, trees, and weeds. Year-round allergic rhinitis (perennial allergic rhinitis) is caused by allergens such as house dust mites, pet dander, and mold spores.  SYMPTOMS   Nasal stuffiness (congestion).  Itchy, runny nose with sneezing and tearing of the eyes. DIAGNOSIS  Your health care provider can help you determine the allergen or allergens that trigger your symptoms. If you and your health care provider are unable to determine the allergen, skin or blood testing may be used. TREATMENT  Allergic Rhinitis does not have a cure, but it can be controlled by:  Medicines and allergy shots (immunotherapy).  Avoiding the allergen. Hay fever may often be treated with antihistamines in pill or nasal spray forms. Antihistamines block the effects of histamine. There are over-the-counter medicines that may help with nasal congestion and swelling around the eyes. Check with your health care provider before taking or giving this medicine.  If avoiding the allergen or the medicine prescribed do not work, there are many new medicines your health care provider can prescribe. Stronger medicine may be used if initial measures are ineffective. Desensitizing injections can be used if medicine and avoidance does not work. Desensitization is when a patient is given ongoing shots until the body becomes less sensitive to the allergen.  Make sure you follow up with your health care provider if problems continue. HOME CARE INSTRUCTIONS It is not possible to completely avoid allergens, but you can reduce your symptoms by taking steps to limit your exposure to them. It helps to know exactly what you are allergic to so that you can avoid your specific triggers. SEEK MEDICAL CARE IF:   You have a fever.  You develop a cough that does not stop easily (persistent).  You have shortness of breath.  You start wheezing.  Symptoms interfere with normal daily activities. Document Released: 12/24/2000 Document Revised: 01/19/2013 Document Reviewed: 12/06/2012 [facility] Patient Information 2014 [place].  Antibiotic Nonuse Only start Zpak if symptoms increase after Friday  Your caregiver felt that the infection or problem was not one that would be helped with an antibiotic. Infections may be caused by viruses or bacteria. Only a caregiver can tell which one of these is the likely cause of an illness. A cold is the most common cause of infection in both adults and children. A cold is a virus. Antibiotic treatment will have no effect on a viral infection. Viruses can lead to many lost days of work caring for sick children and many missed days of school. Children may catch as many as 10 "colds" or "flus" per year during which they can be tearful, cranky, and uncomfortable. The goal of treating a virus is aimed at keeping the ill person comfortable. Antibiotics are medications used to help the body fight bacterial infections. There are relatively few types of bacteria that cause infections but there are hundreds of viruses. While both viruses and bacteria cause infection they are very different types of germs.  A viral infection will typically go away by itself within 7 to 10 days. Bacterial infections may spread or get worse without antibiotic treatment. Examples of bacterial infections are:  Sore throats (like strep throat or  tonsillitis).  Infection in the lung (pneumonia).  Ear and skin infections. Examples of viral infections are:  Colds or flus.  Most coughs and bronchitis.  Sore throats not caused by Strep.  Runny noses. It is often best not to take an antibiotic when a viral infection is the cause of the problem. Antibiotics can kill off the helpful bacteria that we have inside our body and allow harmful bacteria to start growing. Antibiotics can cause side effects such as allergies, nausea, and diarrhea without helping to improve the symptoms of the viral infection. Additionally, repeated uses of antibiotics can cause bacteria inside of our body to become resistant. That resistance can be passed onto harmful bacterial. The next time you have an infection it may be harder to treat if antibiotics are used when they are not needed. Not treating with antibiotics allows our own immune system to develop and take care of infections more efficiently. Also, antibiotics will work better for [name] when they are prescribed for bacterial infections. Treatments for a child that is ill may include:  Give extra fluids throughout the day to stay hydrated.  Get plenty of rest.  Only give your child over-the-counter or prescription medicines for pain, discomfort, or fever as directed by your caregiver.  The use of a cool mist humidifier may help stuffy noses.  Cold medications if suggested by your caregiver. Your caregiver may decide to start you on an antibiotic if:  The problem you were seen for today continues for a longer length of time than expected.  You develop a secondary bacterial infection. SEEK MEDICAL CARE IF:  Fever lasts longer than 5 days.  Symptoms continue to get worse after 5 to 7 days or become severe.  Difficulty in breathing develops.  Signs of dehydration develop (poor drinking, rare urinating, dark colored urine).  Changes in behavior or worsening tiredness (listlessness or  lethargy). Document Released: 06/09/2001 Document Revised: 06/23/2011 Document Reviewed: 12/06/2008 [facility] Patient Information 2014 [place], [place].

## 2013-04-26 NOTE — ED Provider Notes (Signed)
CSN: [id number]     Arrival date & time 04/26/13  1722 History   None    Chief Complaint  Patient presents with  . Facial Pain   (Consider location/radiation/quality/duration/timing/severity/associated sxs/prior Treatment) HPI Comments: 48 yo male presents with clear allergy drainage x 2 days after blowing dust. He notes ears are full and mildly off balance with position change. He notes dry cough. He feels ok he has not had a fever or tried any OTC.   Past Medical History  Diagnosis Date  . Diabetes mellitus   . Hyperlipemia   . Hypertension   . Obesity    Past Surgical History  Procedure Laterality Date  . No past surgeries     Family History  Problem Relation Age of Onset  . Hypertension Neg Hx   . Coronary artery disease Neg Hx   . Colon cancer Neg Hx   . Prostate cancer Neg Hx   . Diabetes Mother    History  Substance Use Topics  . Smoking status: Never Smoker   . Smokeless tobacco: Never Used  . Alcohol Use: No    Review of Systems  HENT: Positive for congestion and postnasal drip.   Respiratory: Positive for cough.   Neurological: Positive for light-headedness.  All other systems reviewed and are negative.    Allergies  Penicillins  Home Medications   Current Outpatient Rx  Name  Route  Sig  Dispense  Refill  . aspirin 81 MG tablet   Oral   Take 81 mg by mouth daily.           Marland Kitchen glimepiride (AMARYL) 2 MG tablet   Oral   Take 1 tablet (2 mg total) by mouth daily before breakfast.   30 tablet   6   . losartan-hydrochlorothiazide (HYZAAR) 100-12.5 MG per tablet   Oral   Take 1 tablet by mouth daily.   30 tablet   12   . metFORMIN (GLUCOPHAGE) 1000 MG tablet      TAKE 1 AND 1/2 TABLETS BY MOUTH 2 TIMES DAILY.   90 tablet   2   . metFORMIN (GLUCOPHAGE) 1000 MG tablet      TAKE 1 AND 1/2 TABLETS BY MOUTH 2 TIMES DAILY.   90 tablet   0     Office visit and labs due   . pravastatin (PRAVACHOL) 40 MG tablet   Oral   Take 1 tablet (40  mg total) by mouth daily.   30 tablet   3   . etodolac (LODINE) 500 MG tablet      TAKE 1 TABLET TWICE DAILY AS NEEDED FOR PAIN. TAKE WITH FOOD   60 tablet   0     Please call to schedule office visit for additiona ...   . EXPIRED: fluticasone (FLONASE) 50 MCG/ACT nasal spray   Nasal   2 sprays by Nasal route daily.   16 g   6   . glucose blood (ONE TOUCH ULTRA TEST) test strip   Other   1 each by Other route daily. Dx 250.00          . glucose blood test strip      Use as instructed   100 each   12     Check once daily as directed. Dx:250.00 onetouch  ...   . Lancets (ONETOUCH ULTRASOFT) lancets      Use as instructed   100 each   12     Check once daily as directed.  Dx: 250.00 Onetouch ...   . niacin (NIASPAN) 1000 MG CR tablet   Oral   Take 500 mg by mouth daily.         Glory Rosebush DELICA LANCETS MISC   Does not apply   by Does not apply route. Daily dx 250.00          . permethrin (ELIMITE) 5 % cream   Topical   Apply topically once.   120 g   0   . pioglitazone (ACTOS) 45 MG tablet      TAKE 1 TABLET BY MOUTH DAILY   30 tablet   0   . tadalafil (CIALIS) 20 MG tablet   Oral   Take 0.5-1 tablets (10-20 mg total) by mouth every other day as needed for erectile dysfunction.   3 tablet   3    BP 155/71  Pulse 97  Temp(Src) 99.1 F (37.3 C) (Oral)  Resp 18  SpO2 97% Physical Exam  Nursing note and vitals reviewed. Constitutional: He is oriented to person, place, and time. He appears well-developed and well-nourished.  HENT:  Head: Normocephalic and atraumatic.  Right Ear: External ear normal.  Left Ear: External ear normal.  Nose: Nose normal.  Mouth/Throat: Oropharynx is clear and moist. No oropharyngeal exudate.  Cloudy TMs  Eyes: Conjunctivae are normal.  Neck: Normal range of motion.  Cardiovascular: Normal rate, regular rhythm, normal heart sounds and intact distal pulses.   Pulmonary/Chest: Effort normal and breath  sounds normal.  Abdominal: Soft.  Musculoskeletal: Normal range of motion.  Lymphadenopathy:    He has no cervical adenopathy.  Neurological: He is alert and oriented to person, place, and time.  Skin: Skin is warm and dry.  Psychiatric: He has a normal mood and affect. Judgment normal.    ED Course  Procedures (including critical care time) Labs Review Labs Reviewed - No data to display Imaging Review No results found.    MDM  1. Elevated BP recheck at home if >130/80 f/u PCP 2. Allergic rhinitis- Allegra OTC, Mucinex OTC, Nasacort OTC all  AD. increase H2o, allergy hygiene explained. Vertigo SX/ hygiene explained 3. IF sx increase after Friday will start Wallace, PA-C 04/26/13 2042

## 2013-05-11 ENCOUNTER — Other Ambulatory Visit: Payer: Self-pay | Admitting: Internal Medicine

## 2013-05-11 NOTE — Telephone Encounter (Signed)
Losartan-HCTZ refilled per protocol. JG//CMA

## 2013-06-11 ENCOUNTER — Other Ambulatory Visit: Payer: Self-pay | Admitting: Internal Medicine

## 2013-06-12 ENCOUNTER — Other Ambulatory Visit: Payer: Self-pay | Admitting: Internal Medicine

## 2013-06-16 ENCOUNTER — Other Ambulatory Visit: Payer: Self-pay | Admitting: Internal Medicine

## 2013-07-12 ENCOUNTER — Telehealth: Payer: Self-pay

## 2013-07-12 NOTE — Telephone Encounter (Addendum)
Left message for call back Non identifiable  Medication List and allergies:  Reviewed and updated  90 day supply/mail order: na Local prescriptions: [facility]  Immunizations due: UTD  A/P:   No changes to FH, PSH or Personal Hx Flu vaccine--01/2013 Tdap--12/2011 [facility]CS--Due now (just turned 29) PSA--none in fileLast A1c--07/2012--5.8  To Discuss with Provider: Needs all meds refilled

## 2013-07-13 ENCOUNTER — Encounter: Payer: Self-pay | Admitting: Internal Medicine

## 2013-07-13 ENCOUNTER — Ambulatory Visit (INDEPENDENT_AMBULATORY_CARE_PROVIDER_SITE_OTHER): Payer: 59 | Admitting: Internal Medicine

## 2013-07-13 VITALS — BP 122/73 | HR 85 | Temp 98.1°F | Ht 74.5 in | Wt 352.0 lb

## 2013-07-13 DIAGNOSIS — J309 Allergic rhinitis, unspecified: Secondary | ICD-10-CM

## 2013-07-13 DIAGNOSIS — Z0000 Encounter for general adult medical examination without abnormal findings: Secondary | ICD-10-CM

## 2013-07-13 DIAGNOSIS — N529 Male erectile dysfunction, unspecified: Secondary | ICD-10-CM

## 2013-07-13 DIAGNOSIS — E119 Type 2 diabetes mellitus without complications: Secondary | ICD-10-CM

## 2013-07-13 DIAGNOSIS — R911 Solitary pulmonary nodule: Secondary | ICD-10-CM

## 2013-07-13 LAB — LIPID PANEL
CHOLESTEROL: 193 mg/dL (ref 0–200)
HDL: 32.9 mg/dL — ABNORMAL LOW (ref >39.00)
LDL CALC: 109 mg/dL — AB (ref 0–99)
Total CHOL/HDL Ratio: 6
Triglycerides: 254 mg/dL — ABNORMAL HIGH (ref 0.0–149.0)
VLDL: 50.8 mg/dL — ABNORMAL HIGH (ref 0.0–40.0)

## 2013-07-13 LAB — CBC WITH DIFFERENTIAL/PLATELET
BASOS PCT: 0.4 % (ref 0.0–3.0)
Basophils Absolute: 0 10*3/uL (ref 0.0–0.1)
EOS ABS: 0.3 10*3/uL (ref 0.0–0.7)
Eosinophils Relative: 4.4 % (ref 0.0–5.0)
HCT: 40.5 % (ref 39.0–52.0)
Hemoglobin: 14.1 g/dL (ref 13.0–17.0)
LYMPHS ABS: 1.4 10*3/uL (ref 0.7–4.0)
Lymphocytes Relative: 22.7 % (ref 12.0–46.0)
MCHC: 34.8 g/dL (ref 30.0–36.0)
MCV: 89.2 fl (ref 78.0–100.0)
MONO ABS: 0.4 10*3/uL (ref 0.1–1.0)
Monocytes Relative: 7 % (ref 3.0–12.0)
Neutro Abs: 3.9 10*3/uL (ref 1.4–7.7)
Neutrophils Relative %: 65.5 % (ref 43.0–77.0)
PLATELETS: 250 10*3/uL (ref 150.0–400.0)
RBC: 4.53 Mil/uL (ref 4.22–5.81)
RDW: 14.8 % — ABNORMAL HIGH (ref 11.5–14.6)
WBC: 6 10*3/uL (ref 4.5–10.5)

## 2013-07-13 LAB — COMPREHENSIVE METABOLIC PANEL
ALBUMIN: 4.1 g/dL (ref 3.5–5.2)
ALT: 22 U/L (ref 0–53)
AST: 19 U/L (ref 0–37)
Alkaline Phosphatase: 57 U/L (ref 39–117)
BUN: 15 mg/dL (ref 6–23)
CO2: 25 meq/L (ref 19–32)
Calcium: 9.4 mg/dL (ref 8.4–10.5)
Chloride: 96 mEq/L (ref 96–112)
Creatinine, Ser: 0.9 mg/dL (ref 0.4–1.5)
GFR: 93.73 mL/min (ref >60.00)
GLUCOSE: 125 mg/dL — AB (ref 70–99)
POTASSIUM: 3.6 meq/L (ref 3.5–5.1)
SODIUM: 132 meq/L — AB (ref 135–145)
Total Bilirubin: 1 mg/dL (ref 0.3–1.2)
Total Protein: 7.2 g/dL (ref 6.0–8.3)

## 2013-07-13 LAB — HEMOGLOBIN A1C: HEMOGLOBIN A1C: 6.4 % (ref 4.6–6.5)

## 2013-07-13 LAB — PSA: PSA: 1.16 ng/mL (ref 0.10–4.00)

## 2013-07-13 LAB — TSH: TSH: 2.47 u[IU]/mL (ref 0.35–5.50)

## 2013-07-13 MED ORDER — METFORMIN HCL 1000 MG PO TABS: ORAL_TABLET | ORAL | Status: DC

## 2013-07-13 MED ORDER — LOSARTAN POTASSIUM-HCTZ 100-12.5 MG PO TABS: ORAL_TABLET | ORAL | Status: DC

## 2013-07-13 MED ORDER — PRAVASTATIN SODIUM 40 MG PO TABS: 40.0000 mg | ORAL_TABLET | Freq: Every day | ORAL | Status: DC

## 2013-07-13 MED ORDER — GLIMEPIRIDE 2 MG PO TABS: 2.0000 mg | ORAL_TABLET | Freq: Every day | ORAL | Status: DC

## 2013-07-13 MED ORDER — PIOGLITAZONE HCL 45 MG PO TABS: ORAL_TABLET | ORAL | Status: DC

## 2013-07-13 MED ORDER — TADALAFIL 20 MG PO TABS: 10.0000 mg | ORAL_TABLET | ORAL | Status: DC | PRN

## 2013-07-13 MED ORDER — FLUTICASONE PROPIONATE 50 MCG/ACT NA SUSP: 2.0000 | Freq: Every day | NASAL | Status: DC

## 2013-07-13 MED ORDER — ETODOLAC 500 MG PO TABS: ORAL_TABLET | ORAL | Status: DC

## 2013-07-13 NOTE — Assessment & Plan Note (Addendum)
Td 2013 Never have a cscope, CCS discussed , for now elected ifob, to think about cscope Labs  counseled  about diet exercise  Chronic medical problems seen well-controlled, appropriate labs will be obtained

## 2013-07-13 NOTE — Progress Notes (Signed)
Subjective:    Patient ID: [name], male    DOB: 1966/02/28, 48 y.o.   MRN: [id number]  DOS:  07/13/2013 Type of  visit: CPX  Also 2 weeks history of sinus congestion, clear or green nasal discharge on and off, + sneezing, no fever chills. Patient thinks symptoms related to allergies.  ROS Diet-- still doing well Exercise-- less active during the winter  DM--- (-) sx c/w low sugars, (-) LE paresthesias , (-) visual disturbances  No  CP, SOB No palpitations, no lower extremity edema Denies  nausea, vomiting diarrhea Denies  blood in the stools No dysuria, gross hematuria, difficulty urinating   No anxiety, depression    Past Medical History  Diagnosis Date  . Diabetes mellitus   . Hyperlipemia   . Hypertension   . Obesity   . OSA (obstructive sleep apnea)     on CPAP    Past Surgical History  Procedure Laterality Date  . No past surgeries      History   Social History  . Marital Status: Married    Spouse Name: [name]    Number of Children: 2  . Years of Education: N/A   Occupational History  . 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 Smoking status: Never Smoker   . Smokeless tobacco: Never Used  . Alcohol Use: No  . Drug Use: No  . Sexual Activity: Yes   Other Topics Concern  . Not on file   Social History Narrative   Remarried for the 3rd time w/ a lady for the [place]                     Family History  Problem Relation Age of Onset  . Hypertension Neg Hx   . Coronary artery disease Neg Hx   . Colon cancer Neg Hx   . Prostate cancer Neg Hx   . Diabetes Mother        Medication List       This list is accurate as of: 07/13/13 12:51 PM.  Always use your most recent med list.               aspirin 81 MG tablet  Take 81 mg by mouth daily.     etodolac 500 MG tablet  Commonly known as:  LODINE  TAKE 1 TABLET TWICE DAILY AS NEEDED FOR PAIN. TAKE WITH FOOD     fluticasone 50 MCG/ACT nasal spray  Commonly known as:  FLONASE  Place 2 sprays into both nostrils daily.     glimepiride 2 MG tablet  Commonly known as:  AMARYL  Take 1 tablet (2 mg total) by mouth daily before breakfast.     losartan-hydrochlorothiazide 100-12.5 MG per tablet  Commonly known as:  HYZAAR  TAKE 1 TABLET BY MOUTH EVERY DAY     metFORMIN 1000 MG tablet  Commonly known as:  GLUCOPHAGE  TAKE 1 AND 1/2 TABLETS BY MOUTH 2 TIMES DAILY.     niacin 1000 MG CR tablet  Commonly known as:  NIASPAN  Take 500 mg by mouth daily.     ONE TOUCH ULTRA TEST test strip  Generic drug:  glucose blood  1 each by Other route daily. Dx 250.00     glucose blood test strip  Use as instructed     onetouch ultrasoft lancets  Use as instructed     pioglitazone 45  MG tablet  Commonly known as:  ACTOS  TAKE 1 TABLET BY MOUTH DAILY     pravastatin 40 MG tablet  Commonly known as:  PRAVACHOL  Take 1 tablet (40 mg total) by mouth daily.     tadalafil 20 MG tablet  Commonly known as:  CIALIS  Take 0.5-1 tablets (10-20 mg total) by mouth every other day as needed for erectile dysfunction.           Objective:   Physical Exam BP 122/73  Pulse 85  Temp(Src) 98.1 F (36.7 C)  Ht 6' 2.5" (1.892 m)  Wt 352 lb (159.666 kg)  BMI 44.60 kg/m2  SpO2 99%  General -- alert, well-developed, NAD.  Neck --no thyromegaly  HEENT-- Not pale. TMs normal, throat symmetric, no redness or discharge. Face symmetric, sinuses not tender to palpation. Nose  slt congested.  Lungs -- normal respiratory effort, no intercostal retractions, no accessory muscle use, and normal breath sounds.  Heart-- normal rate, regular rhythm, no murmur.  Abdomen-- Not distended, good bowel sounds,soft, non-tender.  Rectal-- No external abnormalities noted. Normal sphincter tone. No rectal masses or tenderness. no stool found rostate--Prostate gland firm and smooth, no enlargement, nodularity, tenderness, mass,  asymmetry or induration. Extremities-- no pretibial edema bilaterally  Neurologic--  alert & oriented X3. Speech normal, gait normal, strength normal in all extremities.  Psych-- Cognition and judgment appear intact. Cooperative with normal attention span and concentration. No anxious or depressed appearing.       Assessment & Plan:

## 2013-07-13 NOTE — Assessment & Plan Note (Signed)
Schedule CT

## 2013-07-13 NOTE — Patient Instructions (Signed)
Get your blood work before you leave   Next visit is for routine check up regards your blood sugar , blood pressure   in 4-6 months No need to come back fasting Please make an appointment

## 2013-07-13 NOTE — Assessment & Plan Note (Signed)
Sx c/w allergies, see instructions , RF meds

## 2013-07-13 NOTE — Progress Notes (Signed)
Pre visit review using our clinic review tool, if applicable. No additional management support is needed unless otherwise documented below in the visit note. 

## 2013-07-14 ENCOUNTER — Telehealth: Payer: Self-pay | Admitting: Internal Medicine

## 2013-07-14 NOTE — Telephone Encounter (Signed)
4.2.15  Pt wants you to please fax over A1C report to [facility].  Fax # [phone] [facility].

## 2013-07-18 ENCOUNTER — Ambulatory Visit (INDEPENDENT_AMBULATORY_CARE_PROVIDER_SITE_OTHER)
Admission: RE | Admit: 2013-07-18 | Discharge: 2013-07-18 | Disposition: A | Discharge status: Home / Self Care | Payer: 59 | Source: Ambulatory Visit | Attending: Internal Medicine | Admitting: Internal Medicine

## 2013-07-18 ENCOUNTER — Encounter: Payer: Self-pay | Admitting: *Deleted

## 2013-07-18 DIAGNOSIS — R911 Solitary pulmonary nodule: Secondary | ICD-10-CM

## 2013-07-18 IMAGING — CT CT CHEST W/O CM
2 of 4 series · 15 of 36 positions shown, 18 images · IV contrast (Omnipaque 300)
Comparison: No comparisons

CLINICAL DATA: Followup pulmonary nodule.

EXAM:
CT CHEST WITHOUT CONTRAST
TECHNIQUE: Multidetector CT imaging of the chest was performed following the
standard protocol without IV contrast.

[Series 2: chest routine with · axial · 0.83mm/px · z∈[-120,+156]mm · 12 of 61 slices shown, 15 images]
[im 3/61  mediastinal]
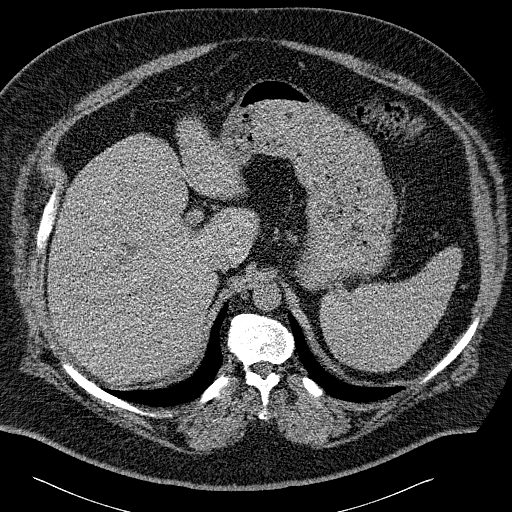
[im 3/61  lung]
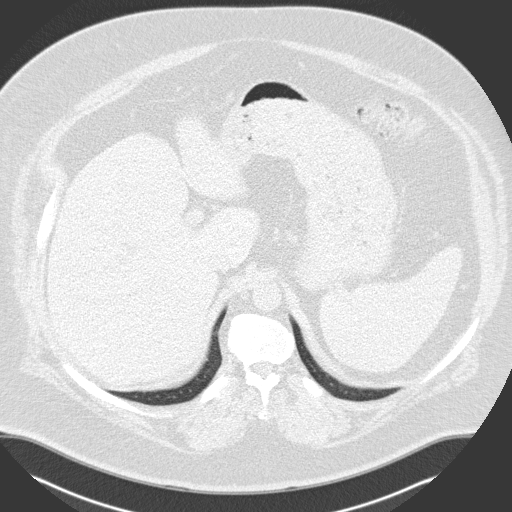
[im 8/61  lung]
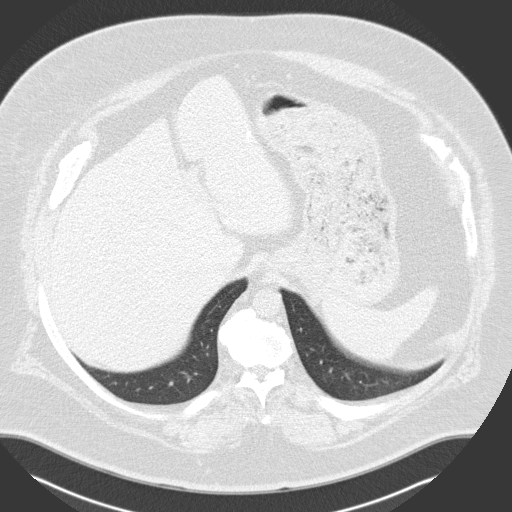
[im 14/61  lung]
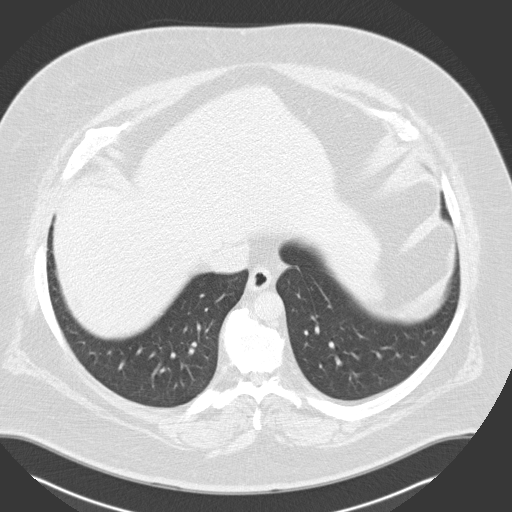
[im 19/61  lung]
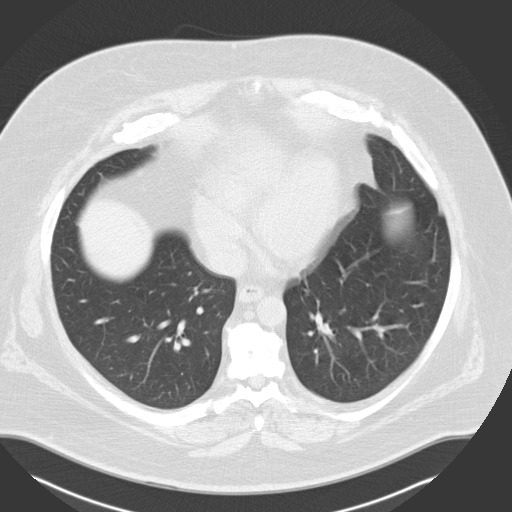
[im 24/61  mediastinal]
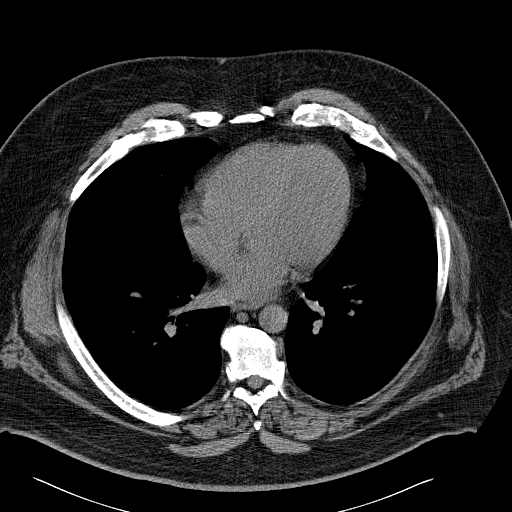
[im 24/61  lung]
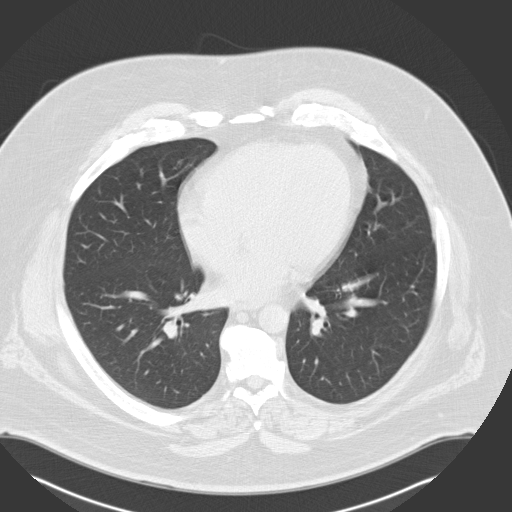
[im 29/61  lung]
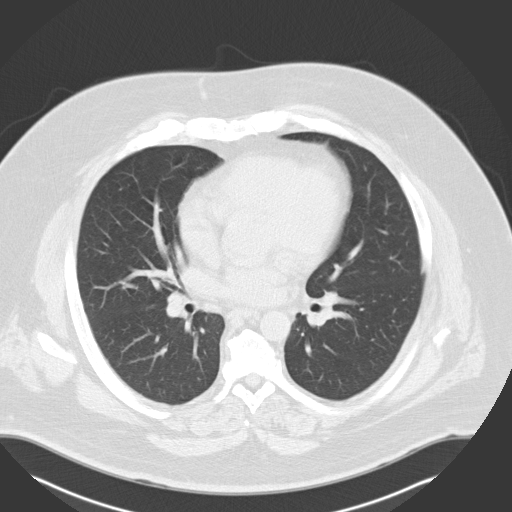
[im 32/61  lung]
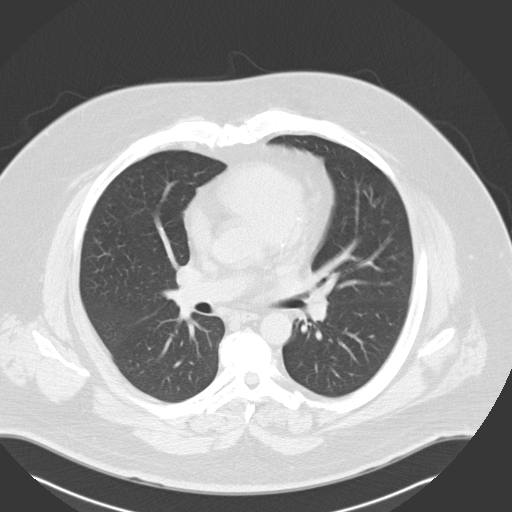
[im 37/61  lung]
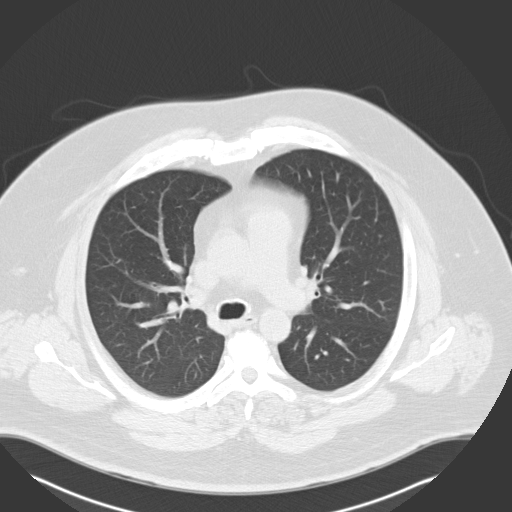
[im 42/61  mediastinal]
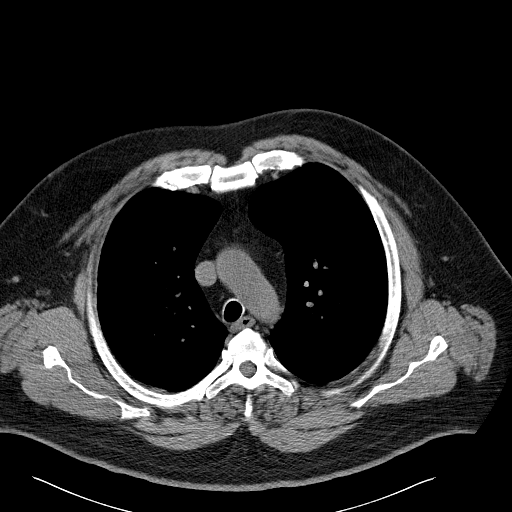
[im 42/61  lung]
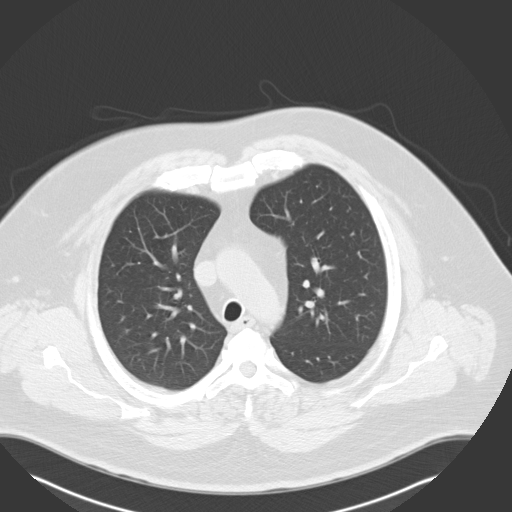
[im 47/61  lung]
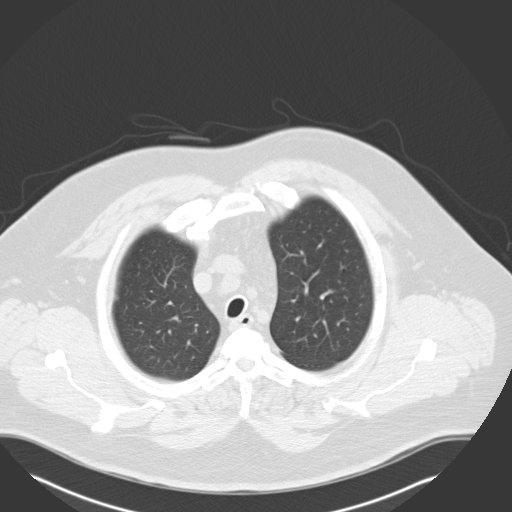
[im 53/61  lung]
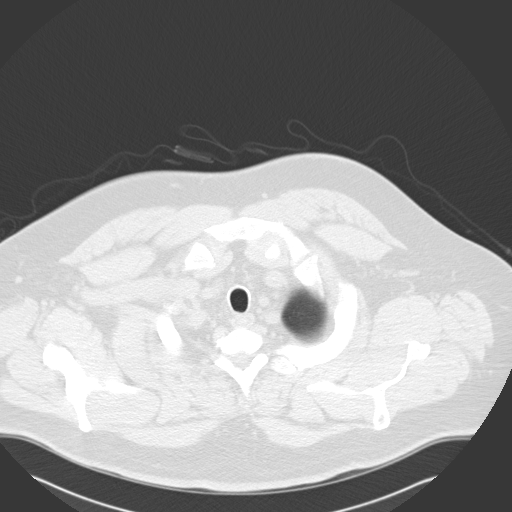
[im 58/61  lung]
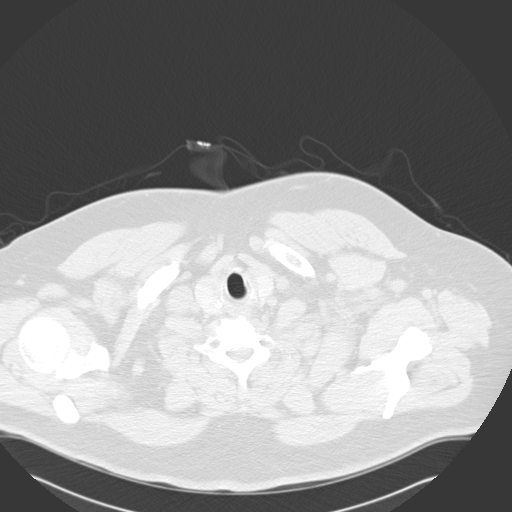

[Series 602: <mpr thick range> · coronal · 0.83mm/px · 3 of 148 slices shown]
[im 30/148  lung]
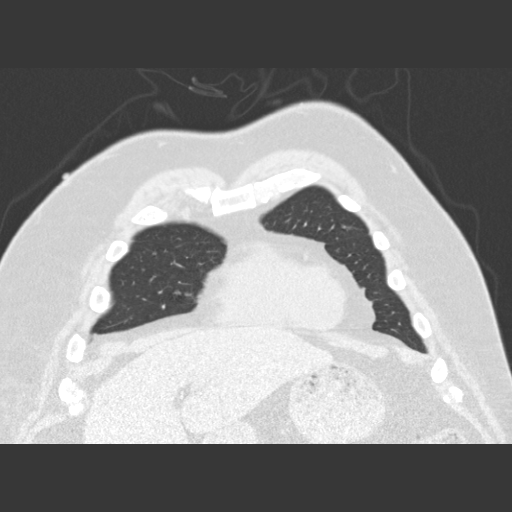
[im 59/148  lung]
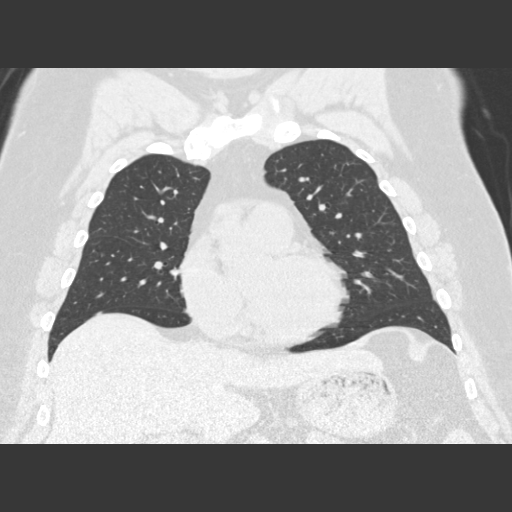
[im 89/148  lung]
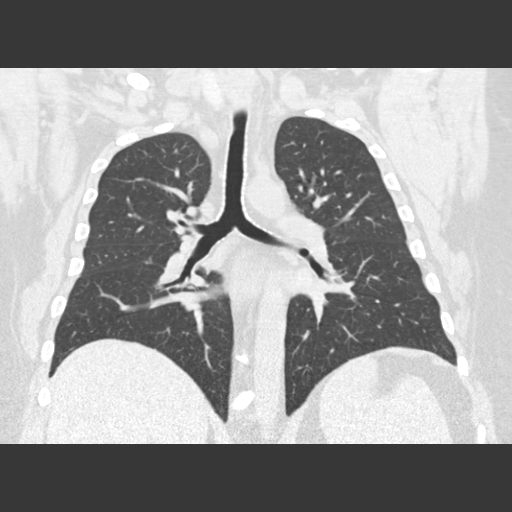

[15 of 36 positions shown; findings below may reference images not displayed]

FINDINGS: No pathologically enlarged mediastinal or axillary lymph nodes.
Hilar regions are difficult to definitively evaluate without IV
contrast but appear grossly unremarkable. At least 2 vessel coronary
artery calcification. Heart size normal. No pericardial effusion.

Lungs are clear.  No pleural fluid.  Airway is unremarkable.

Incidental imaging of the upper abdomen shows the visualized
portions of the liver, gallbladder, adrenal glands, spleen,
pancreas, stomach and bowel to be grossly unremarkable. No upper
abdominal adenopathy. No worrisome lytic or sclerotic lesions.
Degenerative changes are seen in the spine.
IMPRESSION: 1. No acute findings.  No worrisome pulmonary nodules.
2. Coronary artery calcification.

## 2013-07-18 NOTE — Telephone Encounter (Signed)
Faxed information as requested.

## 2013-08-05 ENCOUNTER — Telehealth: Payer: Self-pay

## 2013-08-05 NOTE — Telephone Encounter (Signed)
Relevant patient education mailed to patient.  

## 2013-08-12 ENCOUNTER — Other Ambulatory Visit: Payer: Self-pay | Admitting: Internal Medicine

## 2013-11-08 ENCOUNTER — Other Ambulatory Visit: Payer: Self-pay | Admitting: Internal Medicine

## 2014-01-02 ENCOUNTER — Other Ambulatory Visit: Payer: Self-pay

## 2014-01-02 MED ORDER — METFORMIN HCL 1000 MG PO TABS: ORAL_TABLET | ORAL | Status: DC

## 2014-01-09 ENCOUNTER — Ambulatory Visit: Payer: 59 | Admitting: Internal Medicine

## 2014-05-06 ENCOUNTER — Other Ambulatory Visit: Payer: Self-pay | Admitting: Internal Medicine

## 2014-05-16 ENCOUNTER — Other Ambulatory Visit: Payer: Self-pay | Admitting: Internal Medicine

## 2014-06-12 ENCOUNTER — Encounter: Payer: Self-pay | Admitting: Internal Medicine

## 2014-06-12 ENCOUNTER — Ambulatory Visit (INDEPENDENT_AMBULATORY_CARE_PROVIDER_SITE_OTHER): Payer: 59 | Admitting: Internal Medicine

## 2014-06-12 VITALS — BP 153/81 | HR 93 | Temp 98.1°F | Ht 75.0 in | Wt 353.4 lb

## 2014-06-12 DIAGNOSIS — E119 Type 2 diabetes mellitus without complications: Secondary | ICD-10-CM

## 2014-06-12 DIAGNOSIS — J309 Allergic rhinitis, unspecified: Secondary | ICD-10-CM

## 2014-06-12 DIAGNOSIS — N529 Male erectile dysfunction, unspecified: Secondary | ICD-10-CM

## 2014-06-12 DIAGNOSIS — R911 Solitary pulmonary nodule: Secondary | ICD-10-CM

## 2014-06-12 DIAGNOSIS — I10 Essential (primary) hypertension: Secondary | ICD-10-CM

## 2014-06-12 LAB — BASIC METABOLIC PANEL
BUN: 22 mg/dL (ref 6–23)
CALCIUM: 9.2 mg/dL (ref 8.4–10.5)
CO2: 26 mEq/L (ref 19–32)
CREATININE: 0.89 mg/dL (ref 0.40–1.50)
Chloride: 103 mEq/L (ref 96–112)
GFR: 95.81 mL/min (ref >60.00)
Glucose, Bld: 74 mg/dL (ref 70–99)
Potassium: 3.8 mEq/L (ref 3.5–5.1)
Sodium: 137 mEq/L (ref 135–145)

## 2014-06-12 LAB — HEMOGLOBIN A1C: Hgb A1c MFr Bld: 5.8 % (ref 4.6–6.5)

## 2014-06-12 MED ORDER — GLIMEPIRIDE 2 MG PO TABS: 2.0000 mg | ORAL_TABLET | Freq: Every day | ORAL | Status: DC

## 2014-06-12 MED ORDER — TADALAFIL 20 MG PO TABS: 10.0000 mg | ORAL_TABLET | ORAL | Status: DC | PRN

## 2014-06-12 MED ORDER — METFORMIN HCL 1000 MG PO TABS: 1500.0000 mg | ORAL_TABLET | Freq: Two times a day (BID) | ORAL | Status: DC

## 2014-06-12 MED ORDER — PIOGLITAZONE HCL 45 MG PO TABS: ORAL_TABLET | ORAL | Status: DC

## 2014-06-12 MED ORDER — PRAVASTATIN SODIUM 40 MG PO TABS: 40.0000 mg | ORAL_TABLET | Freq: Every day | ORAL | Status: DC

## 2014-06-12 MED ORDER — ETODOLAC 500 MG PO TABS: ORAL_TABLET | ORAL | Status: DC

## 2014-06-12 MED ORDER — LOSARTAN POTASSIUM-HCTZ 100-12.5 MG PO TABS: ORAL_TABLET | ORAL | Status: DC

## 2014-06-12 NOTE — Assessment & Plan Note (Signed)
Was doing well with diet until Christmas, then he started to gain weight but in the last 2-3 weeks his back to a more healthier lifestyle. Had a eye exam summer 2015, good reports. Feet exam negative today  Plan: Check A1c, refill medications.

## 2014-06-12 NOTE — Assessment & Plan Note (Addendum)
No evidence of a sinus infection at this time, continue Flonase and OTC antihistaminics

## 2014-06-12 NOTE — Patient Instructions (Signed)
Get your blood work before you leave     Please go to the front and schedule a visit  By April 2016  for a physical exam     Come back fasting      Diabetes and Foot Care Diabetes may cause you to have problems because of poor blood supply (circulation) to your feet and legs. This may cause the skin on your feet to become thinner, break easier, and heal more slowly. Your skin may become dry, and the skin may peel and crack. You may also have nerve damage in your legs and feet causing decreased feeling in them. You may not notice minor injuries to your feet that could lead to infections or more serious problems. Taking care of your feet is one of the most important things you can do for yourself.  HOME CARE INSTRUCTIONS  Wear shoes at all times, even in the house. Do not go barefoot. Bare feet are easily injured.  Check your feet daily for blisters, cuts, and redness. If you cannot see the bottom of your feet, use a mirror or ask someone for help.  Wash your feet with warm water (do not use hot water) and mild soap. Then pat your feet and the areas between your toes until they are completely dry. Do not soak your feet as this can dry your skin.  Apply a moisturizing lotion or petroleum jelly (that does not contain alcohol and is unscented) to the skin on your feet and to dry, brittle toenails. Do not apply lotion between your toes.  Trim your toenails straight across. Do not dig under them or around the cuticle. File the edges of your nails with an emery board or nail file.  Do not cut corns or calluses or try to remove them with medicine.  Wear clean socks or stockings every day. Make sure they are not too tight. Do not wear knee-high stockings since they may decrease blood flow to your legs.  Wear shoes that fit properly and have enough cushioning. To break in new shoes, wear them for just a few hours a day. This prevents you from injuring your feet. Always look in your shoes before you put  them on to be sure there are no objects inside.  Do not cross your legs. This may decrease the blood flow to your feet.  If you find a minor scrape, cut, or break in the skin on your feet, keep it and the skin around it clean and dry. These areas may be cleansed with mild soap and water. Do not cleanse the area with peroxide, alcohol, or iodine.  When you remove an adhesive bandage, be sure not to damage the skin around it.  If you have a wound, look at it several times a day to make sure it is healing.  Do not use heating pads or hot water bottles. They may burn your skin. If you have lost feeling in your feet or legs, you may not know it is happening until it is too late.  Make sure your health care provider performs a complete foot exam at least annually or more often if you have foot problems. Report any cuts, sores, or bruises to your health care provider immediately. SEEK MEDICAL CARE IF:   You have an injury that is not healing.  You have cuts or breaks in the skin.  You have an ingrown nail.  You notice redness on your legs or feet.  You feel burning or  tingling in your legs or feet.  You have pain or cramps in your legs and feet.  Your legs or feet are numb.  Your feet always feel cold. SEEK IMMEDIATE MEDICAL CARE IF:   There is increasing redness, swelling, or pain in or around a wound.  There is a red line that goes up your leg.  Pus is coming from a wound.  You develop a fever or as directed by your health care provider.  You notice a bad smell coming from an ulcer or wound. Document Released: 03/28/2000 Document Revised: 12/01/2012 Document Reviewed: 09/07/2012 [facility] Patient Information 2015 [place], [place]. This information is not intended to replace advice given to you by your health care provider. Make sure you discuss any questions you have with your health care provider.

## 2014-06-12 NOTE — Progress Notes (Signed)
Subjective:    Patient ID: [name], male    DOB: 1965/08/13, 49 y.o.   MRN: [id number]  DOS:  06/12/2014 Type of visit - description : rov Interval history: DM--Good compliance with medication, ambulatory blood sugars usually between 86 and 100. Occasionally he feels that his blood sugar may be low, he carries sugar w/ him, symptoms resolve after he eats. No actual CBGs during the episodes Hypertension, good compliance of medication, BP today 153/81, ambulatory BPs okay. Sinus infection? Complaining of sinus congestion, taking Flonase and Claritin.     Review of Systems Denies fever chills No chest pain, difficulty breathing or lower extremity edema No nausea, vomiting, diarrhea. No blood in the stools. No cough or sputum production. Denies any lower extremity paresthesias  Past Medical History  Diagnosis Date  . Diabetes mellitus   . Hyperlipemia   . Hypertension   . Obesity   . OSA (obstructive sleep apnea)     on CPAP    Past Surgical History  Procedure Laterality Date  . No past surgeries      History   Social History  . Marital Status: Married    Spouse Name: [name]  . Number of Children: 2  . Years of Education: N/A   Occupational History  . 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 Smoking status: Never Smoker   . Smokeless tobacco: Never Used  . Alcohol Use: No  . Drug Use: No  . Sexual Activity: Yes   Other Topics Concern  . Not on file   Social History Narrative   Remarried for the 3rd time w/ a lady for the [place]                        Medication List       This list is accurate as of: 06/12/14  9:28 PM.  Always use your most recent med list.               aspirin 81 MG tablet  Take 81 mg by mouth daily.     etodolac 500 MG tablet  Commonly known as:  LODINE  TAKE 1 TABLET TWICE DAILY AS NEEDED FOR PAIN. TAKE WITH FOOD     fluticasone 50 MCG/ACT nasal spray    Commonly known as:  FLONASE  Place 2 sprays into both nostrils daily.     glimepiride 2 MG tablet  Commonly known as:  AMARYL  Take 1 tablet (2 mg total) by mouth daily before breakfast.     losartan-hydrochlorothiazide 100-12.5 MG per tablet  Commonly known as:  HYZAAR  TAKE 1 TABLET BY MOUTH EVERY DAY     metFORMIN 1000 MG tablet  Commonly known as:  GLUCOPHAGE  Take 1.5 tablets (1,500 mg total) by mouth 2 (two) times daily with a meal.     niacin 1000 MG CR tablet  Commonly known as:  NIASPAN  Take 500 mg by mouth daily.     ONE TOUCH ULTRA TEST test strip  Generic drug:  glucose blood  1 each by Other route daily. Dx 250.00     glucose blood test strip  Use as instructed     onetouch ultrasoft lancets  Use as instructed     pioglitazone 45 MG tablet  Commonly known as:  ACTOS  TAKE 1 TABLET BY MOUTH DAILY     pravastatin 40 MG tablet  Commonly known as:  PRAVACHOL  Take 1 tablet (40 mg total) by mouth daily.     tadalafil 20 MG tablet  Commonly known as:  CIALIS  Take 0.5-1 tablets (10-20 mg total) by mouth every other day as needed for erectile dysfunction.           Objective:   Physical Exam BP 153/81 mmHg  Pulse 93  Temp(Src) 98.1 F (36.7 C) (Oral)  Ht 6\' 3"  (1.905 m)  Wt 353 lb 6 oz (160.29 kg)  BMI 44.17 kg/m2  SpO2 99% General:   Well developed, well nourished . NAD.  HEENT:  Normocephalic . Face symmetric, atraumatic, sinuses not TTP Tympanic membranes slightly bulge but not red. Nose congestion, throat without discharge Lungs:  CTA B Normal respiratory effort, no intercostal retractions, no accessory muscle use. Heart: RRR,  no murmur.  DIABETIC FEET EXAM: No lower extremity edema Normal pedal pulses bilaterally Skin normal, nails normal, few calluses Pinprick examination of the feet normal. Neurologic:  alert & oriented X3.  Speech normal, gait appropriate for age and unassisted Psych--  Cognition and judgment appear intact.   Cooperative with normal attention span and concentration.  Behavior appropriate. No anxious or depressed appearing.       Assessment & Plan:

## 2014-06-12 NOTE — Assessment & Plan Note (Signed)
Repeated CT normal

## 2014-06-12 NOTE — Assessment & Plan Note (Signed)
BP today elevated but reports normal ambulatory BPs. Recommend to continue checking BPs at home, refills provided, check a BMP

## 2014-06-12 NOTE — Progress Notes (Signed)
Pre visit review using our clinic review tool, if applicable. No additional management support is needed unless otherwise documented below in the visit note. 

## 2014-06-13 NOTE — Addendum Note (Signed)
Addended by: Wilfrid Lund on: 06/13/2014 12:59 PM   Modules accepted: Orders, Medications

## 2014-06-14 ENCOUNTER — Other Ambulatory Visit: Payer: Self-pay | Admitting: Internal Medicine

## 2014-06-15 ENCOUNTER — Other Ambulatory Visit: Payer: Self-pay | Admitting: Internal Medicine

## 2014-07-19 ENCOUNTER — Other Ambulatory Visit: Payer: Self-pay

## 2014-10-06 ENCOUNTER — Telehealth: Payer: Self-pay | Admitting: Internal Medicine

## 2014-10-06 NOTE — Telephone Encounter (Signed)
pre visit letter mailed 10/06/14

## 2014-10-17 LAB — HM DIABETES EYE EXAM

## 2014-10-26 ENCOUNTER — Telehealth: Payer: Self-pay | Admitting: Behavioral Health

## 2014-10-26 NOTE — Telephone Encounter (Signed)
Unable to reach patient at time of Pre-Visit Call.  Left message for patient to return call when available.    

## 2014-10-27 ENCOUNTER — Ambulatory Visit (INDEPENDENT_AMBULATORY_CARE_PROVIDER_SITE_OTHER): Payer: 59 | Admitting: Internal Medicine

## 2014-10-27 ENCOUNTER — Encounter: Payer: Self-pay | Admitting: Internal Medicine

## 2014-10-27 VITALS — BP 128/84 | HR 84 | Temp 97.6°F | Ht 75.0 in | Wt 346.1 lb

## 2014-10-27 DIAGNOSIS — M1990 Unspecified osteoarthritis, unspecified site: Secondary | ICD-10-CM | POA: Insufficient documentation

## 2014-10-27 DIAGNOSIS — M159 Polyosteoarthritis, unspecified: Secondary | ICD-10-CM

## 2014-10-27 DIAGNOSIS — Z0000 Encounter for general adult medical examination without abnormal findings: Secondary | ICD-10-CM

## 2014-10-27 DIAGNOSIS — E119 Type 2 diabetes mellitus without complications: Secondary | ICD-10-CM | POA: Diagnosis not present

## 2014-10-27 DIAGNOSIS — R7989 Other specified abnormal findings of blood chemistry: Secondary | ICD-10-CM

## 2014-10-27 DIAGNOSIS — Z23 Encounter for immunization: Secondary | ICD-10-CM | POA: Diagnosis not present

## 2014-10-27 DIAGNOSIS — G4733 Obstructive sleep apnea (adult) (pediatric): Secondary | ICD-10-CM

## 2014-10-27 DIAGNOSIS — M150 Primary generalized (osteo)arthritis: Secondary | ICD-10-CM

## 2014-10-27 LAB — BASIC METABOLIC PANEL
BUN: 20 mg/dL (ref 6–23)
CO2: 26 mEq/L (ref 19–32)
CREATININE: 0.96 mg/dL (ref 0.40–1.50)
Calcium: 9.4 mg/dL (ref 8.4–10.5)
Chloride: 102 mEq/L (ref 96–112)
GFR: 87.66 mL/min (ref >60.00)
GLUCOSE: 100 mg/dL — AB (ref 70–99)
Potassium: 3.7 mEq/L (ref 3.5–5.1)
Sodium: 137 mEq/L (ref 135–145)

## 2014-10-27 LAB — LIPID PANEL
CHOLESTEROL: 138 mg/dL (ref 0–200)
HDL: 33 mg/dL — ABNORMAL LOW (ref >39.00)
NONHDL: 105
Total CHOL/HDL Ratio: 4
Triglycerides: 219 mg/dL — ABNORMAL HIGH (ref 0.0–149.0)
VLDL: 43.8 mg/dL — AB (ref 0.0–40.0)

## 2014-10-27 LAB — HEMOGLOBIN A1C: Hgb A1c MFr Bld: 5.4 % (ref 4.6–6.5)

## 2014-10-27 LAB — AST: AST: 17 U/L (ref 0–37)

## 2014-10-27 LAB — ALT: ALT: 17 U/L (ref 0–53)

## 2014-10-27 LAB — LDL CHOLESTEROL, DIRECT: LDL DIRECT: 75 mg/dL

## 2014-10-27 LAB — HM DIABETES FOOT EXAM

## 2014-10-27 MED ORDER — PRAVASTATIN SODIUM 40 MG PO TABS: 40.0000 mg | ORAL_TABLET | Freq: Every day | ORAL | Status: DC

## 2014-10-27 MED ORDER — GLUCOSE BLOOD VI STRP: ORAL_STRIP | Status: DC

## 2014-10-27 MED ORDER — PIOGLITAZONE HCL 45 MG PO TABS
45.0000 mg | ORAL_TABLET | Freq: Every day | ORAL | Status: DC
Start: 2014-10-27 — End: 2017-03-02

## 2014-10-27 MED ORDER — METFORMIN HCL 1000 MG PO TABS: 1500.0000 mg | ORAL_TABLET | Freq: Two times a day (BID) | ORAL | Status: DC

## 2014-10-27 MED ORDER — LOSARTAN POTASSIUM-HCTZ 100-12.5 MG PO TABS: 1.0000 | ORAL_TABLET | Freq: Every day | ORAL | Status: DC

## 2014-10-27 MED ORDER — ETODOLAC 500 MG PO TABS: 500.0000 mg | ORAL_TABLET | Freq: Two times a day (BID) | ORAL | Status: DC | PRN

## 2014-10-27 MED ORDER — ONETOUCH ULTRASOFT LANCETS MISC: Status: DC

## 2014-10-27 NOTE — Progress Notes (Signed)
Pre visit review using our clinic review tool, if applicable. No additional management support is needed unless otherwise documented below in the visit note. 

## 2014-10-27 NOTE — Assessment & Plan Note (Addendum)
Eye exam -2 weeks ago Feet exam negative today Glucometer provided, discussed CBG goals

## 2014-10-27 NOTE — Patient Instructions (Signed)
Get your blood work before you leave   Diabetes: Check your blood sugar  once a day     Check your blood sugar  at different times of the day  GOALS: Fasting before a meal 70- 130 2 hours after a meal less than 180 At bedtime 90-150 Call if consistently not at goal

## 2014-10-27 NOTE — Progress Notes (Signed)
Subjective:    Patient ID: [name], male    DOB: 07-Jan-1966, 49 y.o.   MRN: [id number]  DOS:  10/27/2014 Type of visit - description : CPX Interval history: No major concerns   Review of Systems  Constitutional: No fever. No chills. No unexplained wt changes. No unusual sweats  HEENT: No dental problems, no ear discharge, no facial swelling, no voice changes. No eye discharge, no eye  redness , no  intolerance to light   Respiratory: No wheezing , no  difficulty breathing. No cough , no mucus production  Cardiovascular: No CP, no leg swelling , no  Palpitations  GI: no nausea, no vomiting, no diarrhea , no  abdominal pain.  No blood in the stools. No dysphagia, no odynophagia    Endocrine: No polyphagia, no polyuria , no polydipsia  GU: No dysuria, gross hematuria, difficulty urinating. No urinary urgency, no frequency.  Musculoskeletal: Knee pain well controlled with NSAIDs as needed  Skin: No change in the color of the skin, palor , no  Rash  Allergic, immunologic: No environmental allergies , no  food allergies  Neurological: No dizziness no  syncope. No headaches. No diplopia, no slurred, no slurred speech, no motor deficits, no facial  Numbness  Hematological: No enlarged lymph nodes, no easy bruising , no unusual bleedings  Psychiatry: No suicidal ideas, no hallucinations, no beavior problems, no confusion.  No unusual/severe anxiety, no depression   Past Medical History  Diagnosis Date  . Diabetes mellitus   . Hyperlipemia   . Hypertension   . Obesity   . OSA (obstructive sleep apnea)     on CPAP    Past Surgical History  Procedure Laterality Date  . No past surgeries      History   Social History  . Marital Status: Married    Spouse Name: [name]  . Number of Children: 2  . Years of Education: N/A   Occupational History  . 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 Smoking status:  Never Smoker   . Smokeless tobacco: Never Used  . Alcohol Use: No  . Drug Use: No  . Sexual Activity: Yes   Other Topics Concern  . Not on file   Social History Narrative   Remarried for the 3rd time w/ a lady for the [place]                     Family History  Problem Relation Age of Onset  . Hypertension Neg Hx   . Coronary artery disease Neg Hx   . Colon cancer Neg Hx   . Prostate cancer Neg Hx   . Diabetes Mother        Medication List       This list is accurate as of: 10/27/14 10:25 AM.  Always use your most recent med list.               aspirin 81 MG tablet  Take 81 mg by mouth daily.     etodolac 500 MG tablet  Commonly known as:  LODINE  Take 1 tablet (500 mg total) by mouth 2 (two) times daily as needed. Always take with food.     fluticasone 50 MCG/ACT nasal spray  Commonly known as:  FLONASE  Place 2 sprays into both nostrils daily.     glucose blood test strip  Check blood sugar no more than  twice daily.     losartan-hydrochlorothiazide 100-12.5 MG per tablet  Commonly known as:  HYZAAR  Take 1 tablet by mouth daily.     metFORMIN 1000 MG tablet  Commonly known as:  GLUCOPHAGE  Take 1.5 tablets (1,500 mg total) by mouth 2 (two) times daily with a meal.     niacin 1000 MG CR tablet  Commonly known as:  NIASPAN  Take 500 mg by mouth daily.     onetouch ultrasoft lancets  Check blood sugar no more than twice daily.     pioglitazone 45 MG tablet  Commonly known as:  ACTOS  Take 1 tablet (45 mg total) by mouth daily.     pravastatin 40 MG tablet  Commonly known as:  PRAVACHOL  Take 1 tablet (40 mg total) by mouth daily.     tadalafil 20 MG tablet  Commonly known as:  CIALIS  Take 0.5-1 tablets (10-20 mg total) by mouth every other day as needed for erectile dysfunction.           Objective:   Physical Exam BP 128/84 mmHg  Pulse 84  Temp(Src) 97.6 F (36.4 C) (Oral)  Ht 6\' 3"  (1.905 m)  Wt 346 lb 2 oz (157.001 kg)   BMI 43.26 kg/m2  SpO2 98%  General:   Well developed, well nourished . NAD.  Neck:  Full range of motion. Supple. No  thyromegaly   HEENT:  Normocephalic . Face symmetric, atraumatic Lungs:  CTA B Normal respiratory effort, no intercostal retractions, no accessory muscle use. Heart: RRR,  no murmur.  No pretibial edema bilaterally  Abdomen:  Not distended, soft, non-tender. No rebound or rigidity. No mass,organomegaly Skin: Exposed areas without rash. Not pale. Not jaundice Diabetic foot exam: Normal pedal pulses, no edema, pinprick examination normal Neurologic:  alert & oriented X3.  Speech normal, gait appropriate for age and unassisted Strength symmetric and appropriate for age.  Psych: Cognition and judgment appear intact.  Cooperative with normal attention span and concentration.  Behavior appropriate. No anxious or depressed appearing.       Assessment & Plan:   BMP, LFTs, FLP, CBC, A1c, HIV

## 2014-10-27 NOTE — Assessment & Plan Note (Signed)
Has on and off knee pain, likely DJD, on etodolac as needed

## 2014-10-27 NOTE — Assessment & Plan Note (Signed)
Good compliance with CPAP 

## 2014-10-27 NOTE — Assessment & Plan Note (Addendum)
Td 2013 pnm 23--today Prevnar-- next year CCS--  Decided to proceed w/ cscope, referral  Labs  He is very active at work, counseling about diet

## 2014-10-30 NOTE — Addendum Note (Signed)
Addended by: Wilfrid Lund on: 10/30/2014 01:39 PM   Modules accepted: Medications

## 2014-11-01 ENCOUNTER — Encounter: Payer: Self-pay | Admitting: Gastroenterology

## 2014-11-15 ENCOUNTER — Encounter (HOSPITAL_COMMUNITY): Payer: Self-pay | Admitting: Emergency Medicine

## 2014-11-15 ENCOUNTER — Emergency Department (INDEPENDENT_AMBULATORY_CARE_PROVIDER_SITE_OTHER)
Admission: EM | Admit: 2014-11-15 | Discharge: 2014-11-15 | Disposition: A | Discharge status: Home / Self Care | Payer: 59 | Source: Home / Self Care | Attending: Family Medicine | Admitting: Family Medicine

## 2014-11-15 ENCOUNTER — Telehealth: Payer: Self-pay | Admitting: Internal Medicine

## 2014-11-15 DIAGNOSIS — J0140 Acute pansinusitis, unspecified: Secondary | ICD-10-CM | POA: Diagnosis not present

## 2014-11-15 DIAGNOSIS — H109 Unspecified conjunctivitis: Secondary | ICD-10-CM | POA: Diagnosis not present

## 2014-11-15 MED ORDER — IPRATROPIUM BROMIDE 0.06 % NA SOLN: 2.0000 | Freq: Four times a day (QID) | NASAL | Status: DC

## 2014-11-15 MED ORDER — OLOPATADINE HCL 0.2 % OP SOLN: 1.0000 [drp] | Freq: Every day | OPHTHALMIC | Status: DC

## 2014-11-15 NOTE — ED Provider Notes (Signed)
CSN: [id number]     Arrival date & time 11/15/14  1725 History   First MD Initiated Contact with Patient 11/15/14 1758     Chief Complaint  Patient presents with  . Conjunctivitis  . Sinusitis   (Consider location/radiation/quality/duration/timing/severity/associated sxs/prior Treatment) HPI   3 days ago developed pink eye. Used daugthers prescription pink eye medicine w/o benefit. Red eye. Watery. In the morning eyelid is crusted shut. No change in vision or eye pain.   HA, sinus drainage, Pressure in sinuses. Tylenol and cold medicines w/o benefit.  Sleeps w/ CPAP. Having a hard time sleeping at night duet o congestion. Getting worse. Symptoms constant. Some improvement w/ ibuprofen.   Denies fevers, neck stiffness, dysphagia, sore throat, swollen tonsils, chest pain, short of breath, palpitations, rash.    Past Medical History  Diagnosis Date  . Diabetes mellitus   . Hyperlipemia   . Hypertension   . Obesity   . OSA (obstructive sleep apnea)     on CPAP   Past Surgical History  Procedure Laterality Date  . No past surgeries     Family History  Problem Relation Age of Onset  . Hypertension Neg Hx   . Coronary artery disease Neg Hx   . Colon cancer Neg Hx   . Prostate cancer Neg Hx   . Diabetes Mother    History  Substance Use Topics  . Smoking status: Never Smoker   . Smokeless tobacco: Never Used  . Alcohol Use: No    Review of Systems Per HPI with all other pertinent systems negative.   Allergies  Penicillins  Home Medications   Prior to Admission medications   Medication Sig Start Date End Date Taking? Authorizing Provider  aspirin 81 MG tablet Take 81 mg by mouth daily.      Historical Provider, MD  etodolac (LODINE) 500 MG tablet Take 1 tablet (500 mg total) by mouth 2 (two) times daily as needed. Always take with food. 10/27/14   Colon Branch, MD  fluticasone [facility]) 50 MCG/ACT nasal spray Place 2 sprays into both nostrils daily. 07/13/13 07/13/16  Colon Branch, MD  glucose blood test strip Check blood sugar no more than twice daily. 10/27/14   Colon Branch, MD  ipratropium (ATROVENT) 0.06 % nasal spray Place 2 sprays into both nostrils 4 (four) times daily. 11/15/14   Waldemar Dickens, MD  Lancets [facility] ULTRASOFT) lancets Check blood sugar no more than twice daily. 10/27/14   Colon Branch, MD  losartan-hydrochlorothiazide (HYZAAR) 100-12.5 MG per tablet Take 1 tablet by mouth daily. 10/27/14   Colon Branch, MD  metFORMIN (GLUCOPHAGE) 1000 MG tablet Take 1.5 tablets (1,500 mg total) by mouth 2 (two) times daily with a meal. Patient taking differently: Take 1,000 mg by mouth 2 (two) times daily with a meal.  10/27/14   Colon Branch, MD  niacin (NIASPAN) 1000 MG CR tablet Take 500 mg by mouth daily. 12/29/11   Colon Branch, MD  Olopatadine HCl 0.2 % SOLN Apply 1 drop to eye daily. 11/15/14   Waldemar Dickens, MD  pioglitazone (ACTOS) 45 MG tablet Take 1 tablet (45 mg total) by mouth daily. 10/27/14   Colon Branch, MD  pravastatin (PRAVACHOL) 40 MG tablet Take 1 tablet (40 mg total) by mouth daily. 10/27/14   Colon Branch, MD  tadalafil (CIALIS) 20 MG tablet Take 0.5-1 tablets (10-20 mg total) by mouth every other day as needed for erectile dysfunction.  06/12/14   Colon Branch, MD   BP 137/85 mmHg  Pulse 78  Temp(Src) 97.2 F (36.2 C) (Oral)  Resp 16  SpO2 99% Physical Exam Physical Exam  Constitutional: oriented to person, place, and time. appears well-developed and well-nourished. No distress.  HENT:  Maxillary and frontal sinuses minimally tender to palpation, TMs normal bilaterally, oropharynx normal. Head: Normocephalic and atraumatic.  Eyes: Left eye scleral injection. EOMI. PERRL.  Neck: Normal range of motion.  Cardiovascular: RRR, no m/r/g, 2+ distal pulses,  Pulmonary/Chest: Effort normal and breath sounds normal. No respiratory distress.  Abdominal: Soft. Bowel sounds are normal. NonTTP, no distension.  Musculoskeletal: Normal range of motion. Non ttp, no  effusion.  Neurological: alert and oriented to person, place, and time.  Skin: Skin is warm. No rash noted. non diaphoretic.  Psychiatric: normal mood and affect. behavior is normal. Judgment and thought content normal.    ED Course  Procedures (including critical care time) Labs Review Labs Reviewed - No data to display  Imaging Review No results found.   MDM   1. Conjunctivitis of left eye   2. Subacute pansinusitis    Pataday. Flonase, nasal Atrovent, Zyrtec, Mucinex, ibuprofen. In each robotics at this point time.   Waldemar Dickens, MD 11/15/14 [phone]

## 2014-11-15 NOTE — Discharge Instructions (Signed)
You're likely suffering from a viral infection causing both pinkeye and sinusitis. Please use the Pataday drops for red relief. If this medication is particularly expensive you can buy over-the-counter Zaditor. Please also consider using your Flonase every night before bed, nasal Atrovent during the day, Mucinex, Zyrtec, and ibuprofen 600 800 mg every 6-8 hours for additional relief. Her symptoms should begin to resolve over the course of the next 1-3 days. If you develop fevers, shortness breath or worsening symptoms please come back to the urgent care emergency room or follow-up with primary care physician.   Conjunctivitis Conjunctivitis is commonly called "pink eye." Conjunctivitis can be caused by bacterial or viral infection, allergies, or injuries. There is usually redness of the lining of the eye, itching, discomfort, and sometimes discharge. There may be deposits of matter along the eyelids. A viral infection usually causes a watery discharge, while a bacterial infection causes a yellowish, thick discharge. Pink eye is very contagious and spreads by direct contact. You may be given antibiotic eyedrops as part of your treatment. Before using your eye medicine, remove all drainage from the eye by washing gently with warm water and cotton balls. Continue to use the medication until you have awakened 2 mornings in a row without discharge from the eye. Do not rub your eye. This increases the irritation and helps spread infection. Use separate towels from other household members. Wash your hands with soap and water before and after touching your eyes. Use cold compresses to reduce pain and sunglasses to relieve irritation from light. Do not wear contact lenses or wear eye makeup until the infection is gone. SEEK MEDICAL CARE IF:   Your symptoms are not better after 3 days of treatment.  You have increased pain or trouble seeing.  The outer eyelids become very red or swollen. Document Released:  05/08/2004 Document Revised: 06/23/2011 Document Reviewed: 03/31/2005 [facility] Patient Information 2015 [place], [place]. This information is not intended to replace advice given to you by your health care provider. Make sure you discuss any questions you have with your health care provider.

## 2014-11-15 NOTE — ED Notes (Signed)
C/o sinus and left pink eye States left eye is painful and is red States he has facial pressure and yellow mucous drainage  otc meds taking as tx

## 2014-11-15 NOTE — Telephone Encounter (Signed)
[facility] - Client TELEPHONE ADVICE RECORD [facility]  Patient Name: [name]  Gender: Male  DOB: [birth year]   Age: 49 Y 1 M 8 D  Return Phone Number: [phone] (Primary)  Address:   [place] [address]   Client [facility] - Client  Client Site [facility] Day  Physician [name], [place] Type Call  Call Type Triage / Clinical  Relationship To Patient Self  Appointment Disposition EMR Appointment Attempted - Not Scheduled  Info pasted into Epic Yes  Return Phone Number [phone] (Primary)  Chief Complaint Eye Pain  Initial Comment Has eye infection and drops not helping, eye is irritated and watering  [place] Not Listed [facility]  PreDisposition InappropriateToAsk   Nurse Assessment  Nurse: Genoveva Ill, RN, Lattie Haw Date/Time (Eastern Time): 11/15/2014 4:59:19 PM  Confirm and document reason for call. If symptomatic, describe symptoms. ---caller states has eye infection in left eye and drops not helping, eye is irritated and watering; has not been seen and has been using dtr's left over eye gtts for pink eye about 5 mths ago; c/o clear sticky mucus discharge from eye with redness of sclera and eyelid of left eye is painful; also thinks has sinus infection with frontal h/a, nasal discharge and congestion   advised not to use left over med or another person's rx med, unless MD advises otherwise; per RN knowledge  Has the patient traveled out of the country within the last 30 days? ---No  Does the patient require triage? ---Yes  Related visit to physician within the last 2 weeks? ---No  Does the PT have any chronic conditions? (i.e. diabetes, asthma, etc.) ---Yes  List chronic conditions. ---HTN, NIIDM     Guidelines      Guideline Title Affirmed Question Affirmed Notes Nurse Date/Time (Eastern Time)  Eye - Pus or Discharge Eyelid is red and painful (or tender  to touch)  Burress, RN, Lattie Haw 11/15/2014 5:04:26 PM  Sinus Pain or Congestion [1] Sinus congestion as part of a cold AND [2] present < 10 days (all triage questions negative)  Burress, RN, Lattie Haw 11/15/2014 5:07:26 PM   Disp. Time Eilene Ghazi Time) Disposition Final User   11/15/2014 4:14:39 PM Attempt made - message left  Martyn Ehrich, RN, [facility]   04/17/863 7:84:69 PM Send To Clinical Follow Up Valinda Party, RN, [facility]   09/14/9526 4:13:24 Dietrich, RN, Lattie Haw    11/15/2014 5:06:56 PM See Physician within 24 Hours Yes Burress, RN, Leland Johns Understands: Yes  Disagree/Comply: Engineer, petroleum Understands: Yes  Disagree/Comply: Comply     Care Advice Given Per Guideline      SEE PHYSICIAN WITHIN 24 HOURS: * IF OFFICE WILL BE OPEN: You need to be examined within the next 24 hours. Call your doctor when the office opens, and make an appointment. * Gently wash eyelids and lashes with warm water and wet cotton balls (or cotton gauze). Remove all the dried and liquid pus. * Do this whenever pus is seen on the eyelids. Continue to do this until your appointment. CALL BACK IF: * Blurred vision occurs * Fever occurs * You become worse. CARE ADVICE given per Eye - Pus or Discharge (Adult) guideline.  * Sinus congestion is a normal part of a cold. FOR A STUFFY NOSE -  USE NASAL WASHES: * For pain relief, take acetaminophen, ibuprofen, or naproxen. * Before taking any medicine, read all the instructions on the package. CALL BACK IF: * Severe pain persists over 2 hours after pain medicine * Sinus pain persists over 1 day after using nasal washes * Sinus congestion (fullness) persists over 10 days * Fever lasts over 3 days * You become worse. CARE ADVICE given per Sinus Pain or Congestion (Adult) guideline.   After Care Instructions Given     Call Event Type User Date / Time Description        Comments  User: Margaretha Sheffield, RN Date/Time Eilene Ghazi Time): 11/15/2014 5:16:58 PM  NO APPTS LEFT FOR  TODAY AND CALLER DOES NOT WANT TO SCHEDULE APPT FOR TOMORROW; STATES WILL GO TO [facility], AS HE LEAVES FOR VACATION IN 2 DAYS   Referrals  GO TO FACILITY OTHER - SPECIFY

## 2014-11-16 NOTE — Telephone Encounter (Signed)
[facility].

## 2014-12-29 ENCOUNTER — Ambulatory Visit (AMBULATORY_SURGERY_CENTER): Payer: Self-pay

## 2014-12-29 VITALS — Ht 76.0 in | Wt 346.0 lb

## 2014-12-29 DIAGNOSIS — Z1211 Encounter for screening for malignant neoplasm of colon: Secondary | ICD-10-CM

## 2014-12-29 MED ORDER — SUPREP BOWEL PREP KIT 17.5-3.13-1.6 GM/177ML PO SOLN: 1.0000 | Freq: Once | ORAL | Status: DC

## 2014-12-29 NOTE — Progress Notes (Signed)
No allergies to eggs or soy No home oxygen No diet/weight loss meds No past exposure to anesthesia  Has email  Emmi instructions given for colonoscopy 

## 2015-01-12 ENCOUNTER — Ambulatory Visit (AMBULATORY_SURGERY_CENTER): Payer: 59 | Admitting: Gastroenterology

## 2015-01-12 ENCOUNTER — Encounter: Payer: Self-pay | Admitting: Gastroenterology

## 2015-01-12 VITALS — BP 108/68 | HR 64 | Temp 97.6°F | Resp 34 | Ht 76.0 in | Wt 346.0 lb

## 2015-01-12 DIAGNOSIS — D125 Benign neoplasm of sigmoid colon: Secondary | ICD-10-CM

## 2015-01-12 DIAGNOSIS — K639 Disease of intestine, unspecified: Secondary | ICD-10-CM

## 2015-01-12 DIAGNOSIS — Z1211 Encounter for screening for malignant neoplasm of colon: Secondary | ICD-10-CM | POA: Diagnosis present

## 2015-01-12 LAB — GLUCOSE, CAPILLARY
Glucose-Capillary: 122 mg/dL — ABNORMAL HIGH (ref 65–99)
Glucose-Capillary: 106 mg/dL — ABNORMAL HIGH (ref 65–99)

## 2015-01-12 MED ORDER — SODIUM CHLORIDE 0.9 % IV SOLN: 500.0000 mL | INTRAVENOUS | Status: DC

## 2015-01-12 NOTE — Patient Instructions (Signed)
YOU HAD AN ENDOSCOPIC PROCEDURE TODAY AT [facility]:   Refer to the procedure report that was given to you for any specific questions about what was found during the examination.  If the procedure report does not answer your questions, please call your gastroenterologist to clarify.  If you requested that your care partner not be given the details of your procedure findings, then the procedure report has been included in a sealed envelope for you to review at your convenience later.  YOU SHOULD EXPECT: Some feelings of bloating in the abdomen. Passage of more gas than usual.  Walking can help get rid of the air that was put into your GI tract during the procedure and reduce the bloating. If you had a lower endoscopy (such as a colonoscopy or flexible sigmoidoscopy) you may notice spotting of blood in your stool or on the toilet paper. If you underwent a bowel prep for your procedure, you may not have a normal bowel movement for a few days.  Please Note:  You might notice some irritation and congestion in your nose or some drainage.  This is from the oxygen used during your procedure.  There is no need for concern and it should clear up in a day or so.  SYMPTOMS TO REPORT IMMEDIATELY:   Following lower endoscopy (colonoscopy or flexible sigmoidoscopy):  Excessive amounts of blood in the stool  Significant tenderness or worsening of abdominal pains  Swelling of the abdomen that is new, acute  Fever of 100F or higher   For urgent or emergent issues, a gastroenterologist can be reached at any hour by calling [phone].   DIET: Your first meal following the procedure should be a small meal and then it is ok to progress to your normal diet. Heavy or fried foods are harder to digest and may make you feel nauseous or bloated.  Likewise, meals heavy in dairy and vegetables can increase bloating.  Drink plenty of fluids but you should avoid alcoholic beverages for 24  hours.  ACTIVITY:  You should plan to take it easy for the rest of today and you should NOT DRIVE or use heavy machinery until tomorrow (because of the sedation medicines used during the test).    FOLLOW UP: Our staff will call the number listed on your records the next business day following your procedure to check on you and address any questions or concerns that you may have regarding the information given to you following your procedure. If we do not reach you, we will leave a message.  However, if you are feeling well and you are not experiencing any problems, there is no need to return our call.  We will assume that you have returned to your regular daily activities without incident.  If any biopsies were taken you will be contacted by phone or by letter within the next 1-3 weeks.  Please call us at [phone] if you have not heard about the biopsies in 3 weeks.    SIGNATURES/CONFIDENTIALITY: You and/or your care partner have signed paperwork which will be entered into your electronic medical record.  These signatures attest to the fact that that the information above on your After Visit Summary has been reviewed and is understood.  Full responsibility of the confidentiality of this discharge information lies with you and/or your care-partner.  HOLD ALL ASPIRIN AND NSAIDS FOR TWO WEEKS PER DR STARK.

## 2015-01-12 NOTE — Progress Notes (Signed)
Called to room to assist during endoscopic procedure.  Patient ID and intended procedure confirmed with present staff. Received instructions for my participation in the procedure from the performing physician.  

## 2015-01-12 NOTE — Progress Notes (Signed)
Transferred to recovery room. A/O x3, pleased with MAC.  VSS.  Report to Suzanne, RN. 

## 2015-01-12 NOTE — Op Note (Signed)
[address]  [address] [place], [address]   COLONOSCOPY PROCEDURE REPORT  PATIENT: [name], [name]  MR#: [id number] BIRTHDATE: 11-23-1965 , 49  yrs. old GENDER: male ENDOSCOPIST: Ladene Artist, MD, [facility] REFERRED OZ:HYQM [name], M.D. PROCEDURE DATE:  01/12/2015 PROCEDURE:   Colonoscopy, screening and Colonoscopy with snare polypectomy First Screening Colonoscopy - Avg.  risk and is 50 yrs.  old or older Yes.  Prior Negative Screening - Now for repeat screening. N/A  History of Adenoma - Now for follow-up colonoscopy & has been > or = to 3 yrs.  N/A  Polyps removed today? Yes ASA CLASS:   Class II INDICATIONS:Screening for colonic neoplasia and Colorectal Neoplasm Risk Assessment for this procedure is average risk. MEDICATIONS: Monitored anesthesia care and Propofol 350 mg IV DESCRIPTION OF PROCEDURE:   After the risks benefits and alternatives of the procedure were thoroughly explained, informed consent was obtained.  The digital rectal exam revealed no abnormalities of the rectum.   The LB [id number]  endoscope was introduced through the anus and advanced to the cecum, which was identified by both the appendix and ileocecal valve. No adverse events experienced.   The quality of the prep was excellent. (Suprep was used)  The instrument was then slowly withdrawn as the colon was fully examined. Estimated blood loss is zero unless otherwise noted in this procedure report.    COLON FINDINGS: A sessile polyp measuring 7 mm in size was found in the sigmoid colon.  A polypectomy was performed with a cold snare. The resection was complete, the polyp tissue was completely retrieved and sent to histology.   The colonic mucosa appeared normal at the splenic flexure, in the transverse colon, rectum, descending colon, at the ileocecal valve, cecum, hepatic flexure, and in the ascending colon.  Retroflexed views revealed internal Grade I hemorrhoids. The time to  cecum = 1.3 Withdrawal time = 10.4 The scope was withdrawn and the procedure completed. COMPLICATIONS: There were no immediate complications.  ENDOSCOPIC IMPRESSION: 1.   Sessile polyp in the sigmoid colon; polypectomy performed with a cold snare 2.   The colonic mucosa otherwise appeared normal  RECOMMENDATIONS: 1.  Hold Aspirin and all other NSAIDS for 2 weeks. 2.  Await pathology results 3.  Repeat colonoscopy in 5 years if polyp adenomatous; otherwise 10 years  eSigned:  Ladene Artist, MD, [facility] 01/12/2015 8:56 AM

## 2015-01-15 ENCOUNTER — Telehealth: Payer: Self-pay | Admitting: *Deleted

## 2015-01-15 NOTE — Telephone Encounter (Signed)
  Follow up Call-  Call back number 01/12/2015  Post procedure Call Back phone  # [phone]  Permission to leave phone message Yes     Patient questions:  Message left to call us if necessary.

## 2015-01-16 ENCOUNTER — Encounter: Payer: Self-pay | Admitting: Gastroenterology

## 2015-05-04 ENCOUNTER — Ambulatory Visit: Payer: 59 | Admitting: Internal Medicine

## 2015-06-14 ENCOUNTER — Ambulatory Visit: Payer: 59 | Admitting: Internal Medicine

## 2015-07-06 ENCOUNTER — Encounter (HOSPITAL_BASED_OUTPATIENT_CLINIC_OR_DEPARTMENT_OTHER): Payer: Self-pay | Admitting: Emergency Medicine

## 2015-07-06 ENCOUNTER — Emergency Department (HOSPITAL_BASED_OUTPATIENT_CLINIC_OR_DEPARTMENT_OTHER)
Admission: EM | Admit: 2015-07-06 | Discharge: 2015-07-06 | Disposition: A | Discharge status: Home / Self Care | Payer: 59 | Attending: Emergency Medicine | Admitting: Emergency Medicine

## 2015-07-06 DIAGNOSIS — Z794 Long term (current) use of insulin: Secondary | ICD-10-CM | POA: Insufficient documentation

## 2015-07-06 DIAGNOSIS — E785 Hyperlipidemia, unspecified: Secondary | ICD-10-CM | POA: Insufficient documentation

## 2015-07-06 DIAGNOSIS — M7121 Synovial cyst of popliteal space [Baker], right knee: Secondary | ICD-10-CM | POA: Insufficient documentation

## 2015-07-06 DIAGNOSIS — E669 Obesity, unspecified: Secondary | ICD-10-CM | POA: Insufficient documentation

## 2015-07-06 DIAGNOSIS — Z7982 Long term (current) use of aspirin: Secondary | ICD-10-CM | POA: Diagnosis not present

## 2015-07-06 DIAGNOSIS — I739 Peripheral vascular disease, unspecified: Secondary | ICD-10-CM | POA: Diagnosis not present

## 2015-07-06 DIAGNOSIS — G4730 Sleep apnea, unspecified: Secondary | ICD-10-CM | POA: Diagnosis not present

## 2015-07-06 DIAGNOSIS — Z7984 Long term (current) use of oral hypoglycemic drugs: Secondary | ICD-10-CM | POA: Diagnosis not present

## 2015-07-06 DIAGNOSIS — I10 Essential (primary) hypertension: Secondary | ICD-10-CM | POA: Diagnosis not present

## 2015-07-06 DIAGNOSIS — Z880 Allergy status to penicillin: Secondary | ICD-10-CM | POA: Diagnosis not present

## 2015-07-06 DIAGNOSIS — Z79899 Other long term (current) drug therapy: Secondary | ICD-10-CM | POA: Insufficient documentation

## 2015-07-06 DIAGNOSIS — E119 Type 2 diabetes mellitus without complications: Secondary | ICD-10-CM | POA: Insufficient documentation

## 2015-07-06 DIAGNOSIS — M25561 Pain in right knee: Secondary | ICD-10-CM | POA: Diagnosis present

## 2015-07-06 MED ORDER — TRAMADOL HCL 50 MG PO TABS: 50.0000 mg | ORAL_TABLET | Freq: Two times a day (BID) | ORAL | Status: DC | PRN

## 2015-07-06 NOTE — ED Notes (Signed)
MD at bedside. 

## 2015-07-06 NOTE — ED Notes (Signed)
Pt verbalizes understanding of d/c instructions and denies any further needs at this time. 

## 2015-07-06 NOTE — Discharge Instructions (Signed)
[name] [name], your symptoms may be from a Bakers Murphy.  Continue to use ibuprofen 600mg  every 8 hours as needed for pain.  Use ice packs to the area as well. If pain becomes severe, take tramadol.  See your primary care doctor within 3 days for close follow up. If symptoms worsen, come back to the ED immediately. Thank you. A [name] is a sac-like structure that forms in the back of the knee. It is filled with the same fluid that is located in your knee. This fluid lubricates the bones and cartilage of the knee and allows them to move over each other more easily. CAUSES  When the knee becomes injured or inflamed, increased fluid forms in the knee. When this happens, the joint lining is pushed out behind the knee and forms the [name]. This Murphy may also be caused by inflammation from arthritic conditions and infections. SIGNS AND SYMPTOMS  A [name] usually has no symptoms. When the Murphy is substantially enlarged:  You may feel pressure behind the knee, stiffness in the knee, or a mass in the area behind the knee.  You may develop pain, redness, and swelling in the calf. This can suggest a blood clot and requires evaluation by your health care provider. DIAGNOSIS  A [name] is most often found during an ultrasound exam. This exam may have been performed for other reasons, and the Murphy was found incidentally. Sometimes an MRI is used. This picks up other problems within a joint that an ultrasound exam may not. If the [name] developed immediately after an injury, X-ray exams may be used to diagnose the Murphy. TREATMENT  The treatment depends on the cause of the Murphy. Anti-inflammatory medicines and rest often will be prescribed. If the Murphy is caused by a bacterial infection, antibiotic medicines may be prescribed.  HOME CARE INSTRUCTIONS   If the Murphy was caused by an injury, for the first 24 hours, keep the injured leg elevated on 2 pillows while lying down.  For the first  24 hours while you are awake, apply ice to the injured area:  Put ice in a plastic bag.  Place a towel between your skin and the bag.  Leave the ice on for 20 minutes, 2-3 times a day.  Only take over-the-counter or prescription medicines for pain, discomfort, or fever as directed by your health care provider.  Only take antibiotic medicine as directed. Make sure to finish it even if you start to feel better. MAKE SURE YOU:   Understand these instructions.  Will watch your condition.  Will get help right away if you are not doing well or get worse.   This information is not intended to replace advice given to you by your health care provider. Make sure you discuss any questions you have with your health care provider.   Document Released: 03/31/2005 Document Revised: 01/19/2013 Document Reviewed: 11/10/2012 Elsevier Interactive Patient Education 2016 Elsevier Inc. Knee Pain Knee pain is a common problem. It can have many causes. The pain often goes away by following your doctor's home care instructions. Treatment for ongoing pain will depend on the cause of your pain. If your knee pain continues, more tests may be needed to diagnose your condition. Tests may include X-rays or other imaging studies of your knee. HOME CARE  Take medicines only as told by your doctor.  Rest your knee and keep it raised (elevated) while you are resting.  Do not do things  that cause pain or make your pain worse.  Avoid activities where both feet leave the ground at the same time, such as running, jumping rope, or doing jumping jacks.  Apply ice to the knee area:  Put ice in a plastic bag.  Place a towel between your skin and the bag.  Leave the ice on for 20 minutes, 2-3 times a day.  Ask your doctor if you should wear an elastic knee support.  Sleep with a pillow under your knee.  Lose weight if you are overweight. Being overweight can make your knee hurt more.  Do not use any tobacco  products, including cigarettes, chewing tobacco, or electronic cigarettes. If you need help quitting, ask your doctor. Smoking may slow the healing of any bone and joint problems that you may have. GET HELP IF:  Your knee pain does not stop, it changes, or it gets worse.  You have a fever along with knee pain.  Your knee gives out or locks up.  Your knee becomes more swollen. GET HELP RIGHT AWAY IF:   Your knee feels hot to the touch.  You have chest pain or trouble breathing.   This information is not intended to replace advice given to you by your health care provider. Make sure you discuss any questions you have with your health care provider.   Document Released: 06/27/2008 Document Revised: 04/21/2014 Document Reviewed: 06/01/2013 Elsevier Interactive Patient Education [employer].

## 2015-07-06 NOTE — ED Notes (Signed)
Onset Tuesday night awoke from sleep by cramps notice right knee edema and pain pt fears he has blood clot in leg denies Hx of same

## 2015-07-06 NOTE — ED Provider Notes (Signed)
CSN: [id number]     Arrival date & time 07/06/15  0135 History   First MD Initiated Contact with Patient 07/06/15 0226     Chief Complaint  Patient presents with  . Claudication     (Consider location/radiation/quality/duration/timing/severity/associated sxs/prior Treatment) HPI   [name] is a 50 y.o. male with past medical history of hypertension, hyperlipidemia, diabetes, obesity presenting today with right knee pain. Patient states this is been going on for the past several days. He initially described it as cramps in his right calf. That is now resolved. He now has a tightness and pain to his right knee. He states it is behind his kneecap. This has never happened to him before. His concern for possible blood clot. He denies any long distance travel, surgeries, or history of blood clots in the past. He's had no fevers or recent infections. Patient has no further complaints.      10 Systems reviewed and are negative for acute change except as noted in the HPI.     Past Medical History  Diagnosis Date  . Diabetes mellitus   . Hyperlipemia   . Hypertension   . Obesity   . OSA (obstructive sleep apnea)     on CPAP   Past Surgical History  Procedure Laterality Date  . No past surgeries     Family History  Problem Relation Age of Onset  . Hypertension Neg Hx   . Coronary artery disease Neg Hx   . Colon cancer Neg Hx   . Prostate cancer Neg Hx   . Diabetes Mother    Social History  Substance Use Topics  . Smoking status: Never Smoker   . Smokeless tobacco: Never Used  . Alcohol Use: No    Review of Systems    Allergies  Penicillins  Home Medications   Prior to Admission medications   Medication Sig Start Date End Date Taking? Authorizing Provider  aspirin 81 MG tablet Take 81 mg by mouth daily.      Historical Provider, MD  etodolac (LODINE) 500 MG tablet Take 1 tablet (500 mg total) by mouth 2 (two) times daily as needed. Always take with food. 10/27/14    Colon Branch, MD  fluticasone [facility]) 50 MCG/ACT nasal spray Place 2 sprays into both nostrils daily. 07/13/13 07/13/16  Colon Branch, MD  glucose blood test strip Check blood sugar no more than twice daily. 10/27/14   Colon Branch, MD  ipratropium (ATROVENT) 0.06 % nasal spray Place 2 sprays into both nostrils 4 (four) times daily. 11/15/14   Waldemar Dickens, MD  Lancets [facility] ULTRASOFT) lancets Check blood sugar no more than twice daily. 10/27/14   Colon Branch, MD  losartan-hydrochlorothiazide (HYZAAR) 100-12.5 MG per tablet Take 1 tablet by mouth daily. 10/27/14   Colon Branch, MD  metFORMIN (GLUCOPHAGE) 1000 MG tablet Take 1.5 tablets (1,500 mg total) by mouth 2 (two) times daily with a meal. Patient taking differently: Take 1,000 mg by mouth 2 (two) times daily with a meal.  10/27/14   Colon Branch, MD  niacin (NIASPAN) 1000 MG CR tablet Take 500 mg by mouth daily. 12/29/11   Colon Branch, MD  pioglitazone (ACTOS) 45 MG tablet Take 1 tablet (45 mg total) by mouth daily. 10/27/14   Colon Branch, MD  pravastatin (PRAVACHOL) 40 MG tablet Take 1 tablet (40 mg total) by mouth daily. 10/27/14   Colon Branch, MD  tadalafil (CIALIS) 20 MG  tablet Take 0.5-1 tablets (10-20 mg total) by mouth every other day as needed for erectile dysfunction. 06/12/14   Colon Branch, MD  traMADol (ULTRAM) 50 MG tablet Take 1 tablet (50 mg total) by mouth every 12 (twelve) hours as needed for severe pain. 07/06/15   Everlene Balls, MD   BP 157/86 mmHg  Pulse 80  Temp(Src) 98 F (36.7 C) (Oral)  Resp 20  Ht 6\' 4"  (1.93 m)  Wt 346 lb (156.945 kg)  BMI 42.13 kg/m2  SpO2 100% Physical Exam  Constitutional: He is oriented to person, place, and time. Vital signs are normal. He appears well-developed and well-nourished.  Non-toxic appearance. He does not appear ill. No distress.  Obese male  HENT:  Head: Normocephalic and atraumatic.  Nose: Nose normal.  Mouth/Throat: Oropharynx is clear and moist. No oropharyngeal exudate.  Eyes: Conjunctivae  and EOM are normal. Pupils are equal, round, and reactive to light. No scleral icterus.  Neck: Normal range of motion. Neck supple. No tracheal deviation, no edema, no erythema and normal range of motion present. No thyroid mass and no thyromegaly present.  Cardiovascular: Normal rate, regular rhythm, S1 normal, S2 normal, normal heart sounds, intact distal pulses and normal pulses.  Exam reveals no gallop and no friction rub.   No murmur heard. Pulmonary/Chest: Effort normal and breath sounds normal. No respiratory distress. He has no wheezes. He has no rhonchi. He has no rales.  Abdominal: Soft. Normal appearance and bowel sounds are normal. He exhibits no distension, no ascites and no mass. There is no hepatosplenomegaly. There is no tenderness. There is no rebound, no guarding and no CVA tenderness.  Musculoskeletal: Normal range of motion. He exhibits no edema or tenderness.  No callf Tenderness. Negative Homans sign.  Right knee has no appreciable warmth or swelling. There is tenderness to palpation posteriorly.  Lymphadenopathy:    He has no cervical adenopathy.  Neurological: He is alert and oriented to person, place, and time. He has normal strength. No cranial nerve deficit or sensory deficit.  Skin: Skin is warm, dry and intact. No petechiae and no rash noted. He is not diaphoretic. No erythema. No pallor.  Psychiatric: He has a normal mood and affect. His behavior is normal. Judgment normal.  Nursing note and vitals reviewed.   ED Course  Procedures (including critical care time) Labs Review Labs Reviewed - No data to display  Imaging Review No results found. I have personally reviewed and evaluated these images and lab results as part of my medical decision-making.   EKG Interpretation None      MDM   Final diagnoses:  Baker's cyst of knee, right    Patient presents  To the emergency department for knee pain. Given its location and description of symptoms, this is  consistent with possible Baker's cyst. He has primary careful in 3 days with his primary care physician. He is encouraged to obtain an ultrasound for evaluation. I do not think a blood clot due to his lack of physical exam findings. Education was provided, he is encouraged to use ice packs and ibuprofen for pain control. 10 tablets of tramadol prescription was provided for him. He appears well and in no acute distress, vital signs remain within his normal limits and he is safe for discharge.  Everlene Balls, MD 07/06/15 [phone]

## 2015-07-09 ENCOUNTER — Ambulatory Visit: Payer: 59 | Admitting: Internal Medicine

## 2015-07-09 ENCOUNTER — Ambulatory Visit (INDEPENDENT_AMBULATORY_CARE_PROVIDER_SITE_OTHER): Payer: 59 | Admitting: Internal Medicine

## 2015-07-09 ENCOUNTER — Encounter: Payer: Self-pay | Admitting: Internal Medicine

## 2015-07-09 VITALS — BP 118/66 | HR 75 | Temp 97.6°F | Ht 76.0 in | Wt 348.0 lb

## 2015-07-09 DIAGNOSIS — M25561 Pain in right knee: Secondary | ICD-10-CM

## 2015-07-09 DIAGNOSIS — Z09 Encounter for follow-up examination after completed treatment for conditions other than malignant neoplasm: Secondary | ICD-10-CM

## 2015-07-09 DIAGNOSIS — E119 Type 2 diabetes mellitus without complications: Secondary | ICD-10-CM

## 2015-07-09 DIAGNOSIS — I10 Essential (primary) hypertension: Secondary | ICD-10-CM

## 2015-07-09 LAB — BASIC METABOLIC PANEL
BUN: 21 mg/dL (ref 6–23)
CALCIUM: 9.3 mg/dL (ref 8.4–10.5)
CHLORIDE: 106 meq/L (ref 96–112)
CO2: 26 meq/L (ref 19–32)
Creatinine, Ser: 0.85 mg/dL (ref 0.40–1.50)
GFR: 100.6 mL/min (ref >60.00)
Glucose, Bld: 107 mg/dL — ABNORMAL HIGH (ref 70–99)
POTASSIUM: 4.2 meq/L (ref 3.5–5.1)
SODIUM: 139 meq/L (ref 135–145)

## 2015-07-09 LAB — HEMOGLOBIN A1C: Hgb A1c MFr Bld: 5.9 % (ref 4.6–6.5)

## 2015-07-09 NOTE — Progress Notes (Signed)
Pre visit review using our clinic review tool, if applicable. No additional management support is needed unless otherwise documented below in the visit note. 

## 2015-07-09 NOTE — Progress Notes (Signed)
Subjective:    Patient ID: [name], male    DOB: [birth year], 50 y.o.   MRN: [id number]  DOS:  07/09/2015 Type of visit - description :  Interval history:   ER 07/06/2015 --complained of right knee swelling-pain and calf pain w/o swelling --->  right knee was TTP posteriorly, Baker's cysts? Since then, is doing better,knee is less swollen and painful. Denies any injury but works on his feet and knees all day long. DM: Good compliance with metformin, based on the last A1c will decrease the dose. Ambulatory CBGs 99, 100 High cholesterol: On statins, no apparent side effects.   Wt Readings from Last 3 Encounters:  07/09/15 348 lb (157.852 kg)  07/06/15 346 lb (156.945 kg)  01/12/15 346 lb (156.945 kg)     Review of Systems Denies chest pain, difficulty breathing. No nausea, vomiting, diarrhea  Past Medical History  Diagnosis Date  . Diabetes mellitus   . Hyperlipemia   . Hypertension   . Obesity   . OSA (obstructive sleep apnea)     on CPAP    Past Surgical History  Procedure Laterality Date  . No past surgeries      Social History   Social History  . Marital Status: Married    Spouse Name: [name]  . Number of Children: 2  . Years of Education: N/A   Occupational History  . 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 Smoking status: Never Smoker   . Smokeless tobacco: Never Used  . Alcohol Use: No  . Drug Use: No  . Sexual Activity: Yes   Other Topics Concern  . Not on file   Social History Narrative   Remarried for the 3rd time w/ a lady for the [place]                        Medication List       This list is accurate as of: 07/09/15 12:52 PM.  Always use your most recent med list.               aspirin 81 MG tablet  Take 81 mg by mouth daily.     etodolac 500 MG tablet  Commonly known as:  LODINE  Take 1 tablet (500 mg total) by mouth 2 (two) times daily as needed. Always  take with food.     fluticasone 50 MCG/ACT nasal spray  Commonly known as:  FLONASE  Place 2 sprays into both nostrils daily.     glucose blood test strip  Check blood sugar no more than twice daily.     ipratropium 0.06 % nasal spray  Commonly known as:  ATROVENT  Place 2 sprays into both nostrils 4 (four) times daily.     losartan-hydrochlorothiazide 100-12.5 MG tablet  Commonly known as:  HYZAAR  Take 1 tablet by mouth daily.     metFORMIN 1000 MG tablet  Commonly known as:  GLUCOPHAGE  Take 1,000 mg by mouth 2 (two) times daily with a meal.     niacin 1000 MG CR tablet  Commonly known as:  NIASPAN  Take 500 mg by mouth daily.     onetouch ultrasoft lancets  Check blood sugar no more than twice daily.     pioglitazone 45 MG tablet  Commonly known as:  ACTOS  Take 1 tablet (45 mg total) by mouth daily.  pravastatin 40 MG tablet  Commonly known as:  PRAVACHOL  Take 1 tablet (40 mg total) by mouth daily.     tadalafil 20 MG tablet  Commonly known as:  CIALIS  Take 0.5-1 tablets (10-20 mg total) by mouth every other day as needed for erectile dysfunction.     traMADol 50 MG tablet  Commonly known as:  ULTRAM  Take 1 tablet (50 mg total) by mouth every 12 (twelve) hours as needed for severe pain.           Objective:   Physical Exam BP 118/66 mmHg  Pulse 75  Temp(Src) 97.6 F (36.4 C) (Oral)  Ht 6\' 4"  (1.93 m)  Wt 348 lb (157.852 kg)  BMI 42.38 kg/m2  SpO2 95% General:   Well developed, well nourished . NAD.  HEENT:  Normocephalic . Face symmetric, atraumatic Lungs:  CTA B Normal respiratory effort, no intercostal retractions, no accessory muscle use. Heart: RRR,  no murmur.  No pretibial edema bilaterally  Lower extremities: Left leg-- normal Right leg --- symmetric, no TTP. Knee w/o effusion, popliteal area without swelling, minimal TTP. Neurologic:  alert & oriented X3.  Speech normal, gait appropriate for age and unassisted Psych--    Cognition and judgment appear intact.  Cooperative with normal attention span and concentration.  Behavior appropriate. No anxious or depressed appearing.      Assessment & Plan:   Assessment DM HTN Hyperlipidemia DJD Morbid obesity OSA, + CPAP  PLAN: DM: Last A1c excellent, metformin decreased to 1000 mg twice a day. Actos the same at 45 mg. Check A1c HTN: Well-controlled, continue Hyzaar, check BMP Knee pain: Went to the ER, Baker's cyst?. Feels better, exam essentially negative. Recommend ice, knee sleeve,   etodolac as needed. If symptoms persist ---> ? Ortho referral. Would be optimal to lose weight to prevent further knee  damage. Patient aware. RTC 10-2015 CPX

## 2015-07-09 NOTE — Patient Instructions (Signed)
GO TO THE LAB :      Get the blood work     GO TO THE FRONT DESK Schedule your next appointment for a  Physical exam When?   July 2017 Fasting?  Yes

## 2015-07-09 NOTE — Assessment & Plan Note (Signed)
DM: Last A1c excellent, metformin decreased to 1000 mg twice a day. Actos the same at 45 mg. Check A1c HTN: Well-controlled, continue Hyzaar, check BMP Knee pain: Went to the ER, Baker's cyst?. Feels better, exam essentially negative. Recommend ice, knee sleeve,   etodolac as needed. If symptoms persist ---> ? Ortho referral. Would be optimal to lose weight to prevent further knee  damage. Patient aware. RTC 10-2015 CPX

## 2015-11-17 ENCOUNTER — Other Ambulatory Visit: Payer: Self-pay | Admitting: Internal Medicine

## 2015-11-19 NOTE — Telephone Encounter (Signed)
Rx sent. Pt has CPE scheduled 03/2016.

## 2015-11-19 NOTE — Telephone Encounter (Signed)
Okay 60 and one refill. Due for a CPX, please arrange if not done already

## 2015-11-19 NOTE — Telephone Encounter (Signed)
Pt is requesting refill on Etodolac 500mg  tab.   Last OV: 07/09/2015 Last Fill: 10/27/2014 #60 and 8RF  Please advise.

## 2015-11-22 ENCOUNTER — Encounter: Payer: 59 | Admitting: Internal Medicine

## 2015-12-20 ENCOUNTER — Other Ambulatory Visit: Payer: Self-pay | Admitting: Internal Medicine

## 2016-01-16 ENCOUNTER — Other Ambulatory Visit: Payer: Self-pay | Admitting: Internal Medicine

## 2016-01-23 ENCOUNTER — Encounter: Payer: 59 | Admitting: Internal Medicine

## 2016-02-14 ENCOUNTER — Other Ambulatory Visit: Payer: Self-pay | Admitting: Internal Medicine

## 2016-03-19 ENCOUNTER — Other Ambulatory Visit: Payer: Self-pay | Admitting: Internal Medicine

## 2016-03-26 ENCOUNTER — Encounter: Payer: 59 | Admitting: Internal Medicine

## 2016-04-21 ENCOUNTER — Other Ambulatory Visit: Payer: Self-pay | Admitting: Internal Medicine

## 2016-04-21 DIAGNOSIS — G4733 Obstructive sleep apnea (adult) (pediatric): Secondary | ICD-10-CM | POA: Diagnosis not present

## 2016-04-21 DIAGNOSIS — N529 Male erectile dysfunction, unspecified: Secondary | ICD-10-CM

## 2016-04-22 ENCOUNTER — Other Ambulatory Visit: Payer: Self-pay | Admitting: Internal Medicine

## 2016-04-22 DIAGNOSIS — N529 Male erectile dysfunction, unspecified: Secondary | ICD-10-CM

## 2016-04-24 ENCOUNTER — Other Ambulatory Visit: Payer: Self-pay | Admitting: Internal Medicine

## 2016-05-04 ENCOUNTER — Other Ambulatory Visit: Payer: Self-pay | Admitting: Internal Medicine

## 2016-05-05 ENCOUNTER — Other Ambulatory Visit: Payer: Self-pay

## 2016-05-07 ENCOUNTER — Ambulatory Visit (INDEPENDENT_AMBULATORY_CARE_PROVIDER_SITE_OTHER): Payer: 59 | Admitting: Internal Medicine

## 2016-05-07 ENCOUNTER — Encounter: Payer: Self-pay | Admitting: Internal Medicine

## 2016-05-07 VITALS — BP 132/76 | HR 79 | Temp 97.6°F | Resp 14 | Ht 76.0 in | Wt 332.0 lb

## 2016-05-07 DIAGNOSIS — R252 Cramp and spasm: Secondary | ICD-10-CM

## 2016-05-07 DIAGNOSIS — E118 Type 2 diabetes mellitus with unspecified complications: Secondary | ICD-10-CM | POA: Diagnosis not present

## 2016-05-07 DIAGNOSIS — I10 Essential (primary) hypertension: Secondary | ICD-10-CM

## 2016-05-07 DIAGNOSIS — E785 Hyperlipidemia, unspecified: Secondary | ICD-10-CM

## 2016-05-07 LAB — AST: AST: 17 U/L (ref 0–37)

## 2016-05-07 LAB — CBC WITH DIFFERENTIAL/PLATELET
BASOS ABS: 0 10*3/uL (ref 0.0–0.1)
Basophils Relative: 0.6 % (ref 0.0–3.0)
EOS ABS: 0.2 10*3/uL (ref 0.0–0.7)
Eosinophils Relative: 4.1 % (ref 0.0–5.0)
HEMATOCRIT: 40.2 % (ref 39.0–52.0)
HEMOGLOBIN: 14.1 g/dL (ref 13.0–17.0)
LYMPHS PCT: 16.9 % (ref 12.0–46.0)
Lymphs Abs: 1 10*3/uL (ref 0.7–4.0)
MCHC: 35 g/dL (ref 30.0–36.0)
MCV: 87.4 fl (ref 78.0–100.0)
Monocytes Absolute: 0.4 10*3/uL (ref 0.1–1.0)
Monocytes Relative: 6.3 % (ref 3.0–12.0)
Neutro Abs: 4.3 10*3/uL (ref 1.4–7.7)
Neutrophils Relative %: 72.1 % (ref 43.0–77.0)
Platelets: 233 10*3/uL (ref 150.0–400.0)
RBC: 4.6 Mil/uL (ref 4.22–5.81)
RDW: 14 % (ref 11.5–15.5)
WBC: 5.9 10*3/uL (ref 4.0–10.5)

## 2016-05-07 LAB — LIPID PANEL
CHOL/HDL RATIO: 5
CHOLESTEROL: 164 mg/dL (ref 0–200)
HDL: 31.1 mg/dL — ABNORMAL LOW (ref >39.00)
LDL CALC: 93 mg/dL (ref 0–99)
NONHDL: 132.63
Triglycerides: 198 mg/dL — ABNORMAL HIGH (ref 0.0–149.0)
VLDL: 39.6 mg/dL (ref 0.0–40.0)

## 2016-05-07 LAB — BASIC METABOLIC PANEL
BUN: 14 mg/dL (ref 6–23)
CALCIUM: 9.5 mg/dL (ref 8.4–10.5)
CO2: 29 mEq/L (ref 19–32)
Chloride: 99 mEq/L (ref 96–112)
Creatinine, Ser: 0.96 mg/dL (ref 0.40–1.50)
GFR: 87.14 mL/min (ref >60.00)
GLUCOSE: 122 mg/dL — AB (ref 70–99)
Potassium: 4 mEq/L (ref 3.5–5.1)
SODIUM: 135 meq/L (ref 135–145)

## 2016-05-07 LAB — ALT: ALT: 20 U/L (ref 0–53)

## 2016-05-07 LAB — HEMOGLOBIN A1C: Hgb A1c MFr Bld: 6.8 % — ABNORMAL HIGH (ref 4.6–6.5)

## 2016-05-07 NOTE — Progress Notes (Signed)
Subjective:    Patient ID: [name], male    DOB: 09/27/1965, 51 y.o.   MRN: [id number]  DOS:  05/07/2016 Type of visit - description : rov Interval history: No major concerns except leg cramps at night, at the calves. Already trying magnesium. DM: Doing excellent with diet and exercise, has lost some weight, CBGs 99, 120. No low CBGs that he can tell High cholesterol: Needs a refill of medication. Good compliance HTN: Good med compliance, does not take ambulatory BPs regularly, the last time he did it was 145/100.  Wt Readings from Last 3 Encounters:  05/07/16 (!) 332 lb (150.6 kg)  07/09/15 (!) 348 lb (157.9 kg)  07/06/15 (!) 346 lb (156.9 kg)     Review of Systems  denies chest pain or difficulty breathing No nausea, vomiting, diarrhea. No abdominal pain. Past Medical History:  Diagnosis Date  . Diabetes mellitus   . Hyperlipemia   . Hypertension   . Obesity   . OSA (obstructive sleep apnea)    on CPAP    Past Surgical History:  Procedure Laterality Date  . NO PAST SURGERIES      Social History   Social History  . Marital status: Married    Spouse name: [name]  . Number of children: 2  . Years of education: N/A   Occupational History  . 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 Smoking status: Never Smoker  . Smokeless tobacco: Never Used  . Alcohol use No  . Drug use: No  . Sexual activity: Yes   Other Topics Concern  . Not on file   Social History Narrative   Remarried for the 3rd time w/ a lady for the [place]                      Allergies as of 05/07/2016      Reactions   Penicillins    REACTION: unspecified      Medication List       Accurate as of 05/07/16 11:59 PM. Always use your most recent med list.          aspirin 81 MG tablet Take 81 mg by mouth daily.   CIALIS 20 MG tablet Generic drug:  tadalafil TAKE 0.5-1 TABLETS (10-20 MG TOTAL) BY MOUTH EVERY  OTHER DAY AS NEEDED FOR ERECTILE DYSFUNCTION.   etodolac 500 MG tablet Commonly known as:  LODINE Take 1 tablet (500 mg total) by mouth 2 (two) times daily as needed. Always take with food.   fluticasone 50 MCG/ACT nasal spray Commonly known as:  FLONASE Place 2 sprays into both nostrils daily.   glucose blood test strip Check blood sugar no more than twice daily.   ipratropium 0.06 % nasal spray Commonly known as:  ATROVENT Place 2 sprays into both nostrils 4 (four) times daily.   losartan-hydrochlorothiazide 100-12.5 MG tablet Commonly known as:  HYZAAR Take 1 tablet by mouth daily.   metFORMIN 1000 MG tablet Commonly known as:  GLUCOPHAGE Take 1 tablet (1,000 mg total) by mouth 2 (two) times daily with a meal.   niacin 1000 MG CR tablet Commonly known as:  NIASPAN Take 500 mg by mouth daily.   onetouch ultrasoft lancets Check blood sugar no more than twice daily.   pioglitazone 45 MG tablet Commonly known as:  ACTOS Take 1 tablet (45 mg total) by mouth daily.   pravastatin 40  MG tablet Commonly known as:  PRAVACHOL Take 1 tablet (40 mg total) by mouth daily.          Objective:   Physical Exam BP 132/76 (BP Location: Left Arm, Patient Position: Sitting, Cuff Size: Normal)   Pulse 79   Temp 97.6 F (36.4 C) (Oral)   Resp 14   Ht 6\' 4"  (1.93 m)   Wt (!) 332 lb (150.6 kg)   SpO2 96%   BMI 40.41 kg/m   General:   Well developed, well nourished . NAD.  Neck: No  thyromegaly  HEENT:  Normocephalic . Face symmetric, atraumatic Lungs:  CTA B Normal respiratory effort, no intercostal retractions, no accessory muscle use. Heart: RRR,  no murmur.  No pretibial edema bilaterally  Abdomen:  Not distended, soft, non-tender. No rebound or rigidity.   Skin: Exposed areas without rash. Not pale. Not jaundice Neurologic:  alert & oriented X3.  Speech normal, gait appropriate for age and unassisted Strength symmetric and appropriate for age.  Psych: Cognition  and judgment appear intact.  Cooperative with normal attention span and concentration.  Behavior appropriate. No anxious or depressed appearing.    Assessment & Plan:   Assessment DM HTN Hyperlipidemia DJD - knees  Morbid obesity OSA, + CPAP  PLAN: DM: Under excellent control per last A1c, continue Actos, metformin, check an A1c. Doing great with lifestyle. Consider decrease medication doses HTN: Continue Hyzaar, BP today is great, recommend to check ambulatory BPs. See instructions. Checking a BMP, CBC. Hyperlipidemia: Continue Pravachol and Niaspan. Checking an FLP, AST, ALT. Refill medications. Leg cramps: Recommend a trial with diet tonic water and continue magnesium.  RTC 6 months, CPX

## 2016-05-07 NOTE — Progress Notes (Signed)
Pre visit review using our clinic review tool, if applicable. No additional management support is needed unless otherwise documented below in the visit note. 

## 2016-05-07 NOTE — Patient Instructions (Signed)
GO TO THE LAB : Get the blood work     GO TO THE FRONT DESK Schedule your next appointment for a  Physical exam in 6 months    Check the  blood pressure 2 or 3 times a month  Be sure your blood pressure is between 110/65 and  135/85.  if it is consistently higher or lower, let me know

## 2016-05-08 NOTE — Assessment & Plan Note (Signed)
DM: Under excellent control per last A1c, continue Actos, metformin, check an A1c. Doing great with lifestyle. Consider decrease medication doses HTN: Continue Hyzaar, BP today is great, recommend to check ambulatory BPs. See instructions. Checking a BMP, CBC. Hyperlipidemia: Continue Pravachol and Niaspan. Checking an FLP, AST, ALT. Refill medications. Leg cramps: Recommend a trial with diet tonic water and continue magnesium.  RTC 6 months, CPX

## 2016-05-17 ENCOUNTER — Other Ambulatory Visit: Payer: Self-pay | Admitting: Internal Medicine

## 2016-05-19 ENCOUNTER — Other Ambulatory Visit: Payer: Self-pay | Admitting: Internal Medicine

## 2016-05-19 DIAGNOSIS — N529 Male erectile dysfunction, unspecified: Secondary | ICD-10-CM

## 2016-05-26 ENCOUNTER — Other Ambulatory Visit: Payer: Self-pay | Admitting: Internal Medicine

## 2016-06-02 ENCOUNTER — Encounter: Payer: 59 | Admitting: Internal Medicine

## 2016-06-03 ENCOUNTER — Other Ambulatory Visit: Payer: Self-pay | Admitting: Internal Medicine

## 2016-06-16 DIAGNOSIS — E119 Type 2 diabetes mellitus without complications: Secondary | ICD-10-CM | POA: Diagnosis not present

## 2016-06-16 DIAGNOSIS — H40033 Anatomical narrow angle, bilateral: Secondary | ICD-10-CM | POA: Diagnosis not present

## 2016-07-14 ENCOUNTER — Other Ambulatory Visit: Payer: Self-pay | Admitting: Internal Medicine

## 2016-07-21 DIAGNOSIS — G4733 Obstructive sleep apnea (adult) (pediatric): Secondary | ICD-10-CM | POA: Diagnosis not present

## 2016-08-01 ENCOUNTER — Other Ambulatory Visit: Payer: Self-pay

## 2016-08-01 MED ORDER — ETODOLAC 500 MG PO TABS: 500.0000 mg | ORAL_TABLET | Freq: Two times a day (BID) | ORAL | 0 refills | Status: DC | PRN

## 2016-08-25 ENCOUNTER — Encounter: Payer: Self-pay | Admitting: Internal Medicine

## 2016-08-25 ENCOUNTER — Ambulatory Visit (INDEPENDENT_AMBULATORY_CARE_PROVIDER_SITE_OTHER): Payer: 59 | Admitting: Internal Medicine

## 2016-08-25 VITALS — BP 132/70 | HR 79 | Temp 97.5°F | Resp 14 | Ht 76.0 in | Wt 330.4 lb

## 2016-08-25 DIAGNOSIS — Z0000 Encounter for general adult medical examination without abnormal findings: Secondary | ICD-10-CM | POA: Diagnosis not present

## 2016-08-25 DIAGNOSIS — Z1159 Encounter for screening for other viral diseases: Secondary | ICD-10-CM

## 2016-08-25 DIAGNOSIS — Z114 Encounter for screening for human immunodeficiency virus [HIV]: Secondary | ICD-10-CM

## 2016-08-25 DIAGNOSIS — J302 Other seasonal allergic rhinitis: Secondary | ICD-10-CM

## 2016-08-25 LAB — COMPREHENSIVE METABOLIC PANEL
ALK PHOS: 55 U/L (ref 39–117)
ALT: 18 U/L (ref 0–53)
AST: 13 U/L (ref 0–37)
Albumin: 4.3 g/dL (ref 3.5–5.2)
BILIRUBIN TOTAL: 0.7 mg/dL (ref 0.2–1.2)
BUN: 17 mg/dL (ref 6–23)
CALCIUM: 9.5 mg/dL (ref 8.4–10.5)
CO2: 28 meq/L (ref 19–32)
Chloride: 100 mEq/L (ref 96–112)
Creatinine, Ser: 0.88 mg/dL (ref 0.40–1.50)
GFR: 96.23 mL/min (ref >60.00)
GLUCOSE: 117 mg/dL — AB (ref 70–99)
POTASSIUM: 3.9 meq/L (ref 3.5–5.1)
Sodium: 136 mEq/L (ref 135–145)
Total Protein: 7.1 g/dL (ref 6.0–8.3)

## 2016-08-25 LAB — PSA: PSA: 1.39 ng/mL (ref 0.10–4.00)

## 2016-08-25 LAB — HEMOGLOBIN A1C: HEMOGLOBIN A1C: 6.8 % — AB (ref 4.6–6.5)

## 2016-08-25 LAB — TSH: TSH: 1.97 u[IU]/mL (ref 0.35–4.50)

## 2016-08-25 MED ORDER — CROMOLYN SODIUM 4 % OP SOLN: 1.0000 [drp] | Freq: Two times a day (BID) | OPHTHALMIC | 12 refills | Status: DC

## 2016-08-25 MED ORDER — FLUTICASONE PROPIONATE 50 MCG/ACT NA SUSP: 2.0000 | Freq: Every day | NASAL | 12 refills | Status: DC

## 2016-08-25 MED ORDER — PRAVASTATIN SODIUM 40 MG PO TABS: 40.0000 mg | ORAL_TABLET | Freq: Every day | ORAL | 6 refills | Status: DC

## 2016-08-25 NOTE — Progress Notes (Signed)
Pre visit review using our clinic review tool, if applicable. No additional management support is needed unless otherwise documented below in the visit note. 

## 2016-08-25 NOTE — Assessment & Plan Note (Addendum)
--  Td 2013; pnm 23: 2016; Prevnar at age 52. --prostate ca screening : DRE wnl today, check a PSA --CCS--  Cscope 12-2014, 10 years  Labs : CMP, A1c, TSH, TSH, HIV, hep C, PSA --He is very active at work, diet discussed

## 2016-08-25 NOTE — Progress Notes (Signed)
Subjective:    Patient ID: [name], male    DOB: 01-Dec-1965, 51 y.o.   MRN: [id number]  DOS:  08/25/2016 Type of visit - description : cpx Interval history: Feeling well except for allergies. Good medication compliance.  Wt Readings from Last 3 Encounters:  08/25/16 (!) 330 lb 6 oz (149.9 kg)  05/07/16 (!) 332 lb (150.6 kg)  07/09/15 (!) 348 lb (157.9 kg)     Review of Systems Having allergies, itchy eyes, some sticky discharge in the corner of the eye in the mornings. Also some nose allergies.   Other than above, a 14 point review of systems is negative      Past Medical History:  Diagnosis Date  . Diabetes mellitus   . Hyperlipemia   . Hypertension   . Obesity   . OSA (obstructive sleep apnea)    on CPAP    Past Surgical History:  Procedure Laterality Date  . NO PAST SURGERIES      Social History   Social History  . Marital status: Married    Spouse name: [name]  . Number of children: 2  . Years of education: N/A   Occupational History  . 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 Smoking status: Never Smoker  . Smokeless tobacco: Never Used  . Alcohol use No  . Drug use: No  . Sexual activity: Yes   Other Topics Concern  . Not on file   Social History Narrative   Remarried for the 3rd time w/ a lady for the [place]                     Family History  Problem Relation Age of Onset  . Diabetes Mother   . Hypertension Neg Hx   . Coronary artery disease Neg Hx   . Colon cancer Neg Hx   . Prostate cancer Neg Hx     Allergies as of 08/25/2016      Reactions   Penicillins    REACTION: unspecified      Medication List       Accurate as of 08/25/16 11:59 PM. Always use your most recent med list.          aspirin 81 MG tablet Take 81 mg by mouth daily.   CIALIS 20 MG tablet Generic drug:  tadalafil TAKE 0.5-1 TABLETS (10-20 MG TOTAL) BY MOUTH EVERY OTHER DAY AS  NEEDED FOR ERECTILE DYSFUNCTION.   cromolyn 4 % ophthalmic solution Commonly known as:  OPTICROM Place 1 drop into both eyes 2 (two) times daily.   etodolac 500 MG tablet Commonly known as:  LODINE Take 1 tablet (500 mg total) by mouth 2 (two) times daily as needed. Always take with food.   fluticasone 50 MCG/ACT nasal spray Commonly known as:  FLONASE Place 2 sprays into both nostrils daily.   glucose blood test strip Check blood sugar no more than twice daily.   losartan-hydrochlorothiazide 100-12.5 MG tablet Commonly known as:  HYZAAR Take 1 tablet by mouth daily.   metFORMIN 1000 MG tablet Commonly known as:  GLUCOPHAGE Take 1 tablet (1,000 mg total) by mouth 2 (two) times daily with a meal.   onetouch ultrasoft lancets Check blood sugar no more than twice daily.   pioglitazone 45 MG tablet Commonly known as:  ACTOS Take 1 tablet (45 mg total) by mouth daily.   pravastatin 40 MG tablet Commonly  known as:  PRAVACHOL Take 1 tablet (40 mg total) by mouth daily.          Objective:   Physical Exam BP 132/70 (BP Location: Left Arm, Patient Position: Sitting, Cuff Size: Normal)   Pulse 79   Temp 97.5 F (36.4 C) (Oral)   Resp 14   Ht 6\' 4"  (1.93 m)   Wt (!) 330 lb 6 oz (149.9 kg)   SpO2 95%   BMI 40.21 kg/m   General:   Well developed, well nourished . NAD.  Neck: No  thyromegaly  HEENT:  Normocephalic . Face symmetric, atraumatic Lungs:  CTA B Normal respiratory effort, no intercostal retractions, no accessory muscle use. Heart: RRR,  no murmur.  No pretibial edema bilaterally  Abdomen:  Not distended, soft, non-tender. No rebound or rigidity.   Rectal:  External abnormalities: Skin tag 1. Normal sphincter tone. No rectal masses or tenderness.  No stools found Prostate: Prostate gland firm and smooth, no enlargement, nodularity, tenderness, mass, asymmetry or induration.  Skin: Exposed areas without rash. Not pale. Not jaundice Neurologic:  alert  & oriented X3.  Speech normal, gait appropriate for age and unassisted Strength symmetric and appropriate for age.  Psych: Cognition and judgment appear intact.  Cooperative with normal attention span and concentration.  Behavior appropriate. No anxious or depressed appearing.    Assessment & Plan:   Assessment DM HTN Hyperlipidemia DJD - knees  Morbid obesity OSA, + CPAP  PLAN: DM: Continue Actos, metformin. Checking an A1c HTN: Ambulatory BPs in the 130/80, occasionally  140/90. Continue monitoring, as long as they 140s are not frequent we are okay. Cont Hyzaar. Hyperlipidemia: On Pravachol, will stop Niaspan. Check a FLP on RTC DJD: Takes a sporadic NSAIDs Morbid obesity: Has dropped 2 pounds, encouraged to continue in that direction. Allergies: Recommend consistent use of Flonase and start cromolyn ophthalmic drops. RTC 6 months

## 2016-08-25 NOTE — Patient Instructions (Signed)
GO TO THE LAB : Get the blood work     GO TO THE FRONT DESK Schedule your next appointment for a routine checkup in 6 months, fasting   For allergies, use Flonase and the eye drops cromolyn Once the allergy season is over, you can stop medications until the next season starts.

## 2016-08-26 LAB — HEPATITIS C ANTIBODY: HCV Ab: NEGATIVE

## 2016-08-26 LAB — HIV ANTIBODY (ROUTINE TESTING W REFLEX): HIV: NONREACTIVE

## 2016-08-26 NOTE — Assessment & Plan Note (Signed)
DM: Continue Actos, metformin. Checking an A1c HTN: Ambulatory BPs in the 130/80, occasionally  140/90. Continue monitoring, as long as they 140s are not frequent we are okay. Cont Hyzaar. Hyperlipidemia: On Pravachol, will stop Niaspan. Check a FLP on RTC DJD: Takes a sporadic NSAIDs Morbid obesity: Has dropped 2 pounds, encouraged to continue in that direction. Allergies: Recommend consistent use of Flonase and start cromolyn ophthalmic drops. RTC 6 months

## 2016-10-20 DIAGNOSIS — G4733 Obstructive sleep apnea (adult) (pediatric): Secondary | ICD-10-CM | POA: Diagnosis not present

## 2016-10-22 ENCOUNTER — Other Ambulatory Visit: Payer: Self-pay | Admitting: Internal Medicine

## 2016-10-23 NOTE — Telephone Encounter (Signed)
Rx sent 

## 2016-10-23 NOTE — Telephone Encounter (Signed)
Okay #60, 1 refill

## 2016-10-23 NOTE — Telephone Encounter (Signed)
Pt is requesting refill on etodolac 500mg . Please advise.

## 2016-11-19 ENCOUNTER — Other Ambulatory Visit: Payer: Self-pay | Admitting: Internal Medicine

## 2016-12-17 ENCOUNTER — Other Ambulatory Visit: Payer: Self-pay | Admitting: Internal Medicine

## 2017-01-19 DIAGNOSIS — G4733 Obstructive sleep apnea (adult) (pediatric): Secondary | ICD-10-CM | POA: Diagnosis not present

## 2017-01-20 ENCOUNTER — Other Ambulatory Visit: Payer: Self-pay

## 2017-01-20 MED ORDER — METFORMIN HCL 1000 MG PO TABS: 1000.0000 mg | ORAL_TABLET | Freq: Two times a day (BID) | ORAL | 0 refills | Status: DC

## 2017-01-27 ENCOUNTER — Other Ambulatory Visit: Payer: Self-pay | Admitting: Internal Medicine

## 2017-01-27 NOTE — Telephone Encounter (Signed)
Ok 60 and 1  

## 2017-01-27 NOTE — Telephone Encounter (Signed)
Rx sent 

## 2017-01-27 NOTE — Telephone Encounter (Signed)
Pt is requesting refill on etodolac 500mg . Last Fill: 10/23/2016 #60 and 1RF. Please advise.

## 2017-03-02 ENCOUNTER — Ambulatory Visit (INDEPENDENT_AMBULATORY_CARE_PROVIDER_SITE_OTHER): Payer: 59 | Admitting: Internal Medicine

## 2017-03-02 ENCOUNTER — Encounter: Payer: Self-pay | Admitting: Internal Medicine

## 2017-03-02 VITALS — BP 142/78 | HR 78 | Temp 97.8°F | Resp 14 | Ht 76.0 in | Wt 335.1 lb

## 2017-03-02 DIAGNOSIS — E785 Hyperlipidemia, unspecified: Secondary | ICD-10-CM

## 2017-03-02 DIAGNOSIS — E118 Type 2 diabetes mellitus with unspecified complications: Secondary | ICD-10-CM

## 2017-03-02 DIAGNOSIS — I10 Essential (primary) hypertension: Secondary | ICD-10-CM

## 2017-03-02 LAB — BASIC METABOLIC PANEL
BUN: 15 mg/dL (ref 6–23)
CHLORIDE: 103 meq/L (ref 96–112)
CO2: 28 mEq/L (ref 19–32)
CREATININE: 0.91 mg/dL (ref 0.40–1.50)
Calcium: 9.2 mg/dL (ref 8.4–10.5)
GFR: 92.4 mL/min (ref >60.00)
Glucose, Bld: 158 mg/dL — ABNORMAL HIGH (ref 70–99)
Potassium: 4.1 mEq/L (ref 3.5–5.1)
Sodium: 138 mEq/L (ref 135–145)

## 2017-03-02 LAB — LIPID PANEL
CHOL/HDL RATIO: 5
CHOLESTEROL: 145 mg/dL (ref 0–200)
HDL: 30 mg/dL — ABNORMAL LOW (ref >39.00)
LDL CALC: 75 mg/dL (ref 0–99)
NonHDL: 115.22
Triglycerides: 200 mg/dL — ABNORMAL HIGH (ref 0.0–149.0)
VLDL: 40 mg/dL (ref 0.0–40.0)

## 2017-03-02 LAB — HEMOGLOBIN A1C: HEMOGLOBIN A1C: 7.4 % — AB (ref 4.6–6.5)

## 2017-03-02 NOTE — Assessment & Plan Note (Signed)
DM: Good compliance with metformin, reports not taking Actos for several months, reason?. Has gained a little weight, diet: room for improvement. strongly rec  to stop regular sodas.  Check A1c.  Normal foot exam today, normal eye exam March 2018 per patient HTN: On losartan HCT, recommend to d/w his pharmacist the losartan recall.  Check a BMP High cholesterol: Only on Pravachol, niacin was stopped several months ago.  Check lipids  RTC 6 months, CPX

## 2017-03-02 NOTE — Progress Notes (Signed)
Subjective:    Patient ID: [name], male    DOB: 26-Feb-1966, 51 y.o.   MRN: [id number]  DOS:  03/02/2017 Type of visit - description : rov Interval history: HTN: Good compliance with medication, ambulatory BP is usually in the 130s. DM: Today patient reports he has not been taking Actos for a long time.  Diet has not been as good as before, admits to drink several sodas daily. Had an eye exam March 2018, negative. High cholesterol: Good compliance with meds.   Wt Readings from Last 3 Encounters:  03/02/17 (!) 335 lb 2 oz (152 kg)  08/25/16 (!) 330 lb 6 oz (149.9 kg)  05/07/16 (!) 332 lb (150.6 kg)     Review of Systems  Has gained some weight. No lower extremity paresthesias He remains active at work  Past Medical History:  Diagnosis Date  . Diabetes mellitus   . Hyperlipemia   . Hypertension   . Obesity   . OSA (obstructive sleep apnea)    on CPAP    Past Surgical History:  Procedure Laterality Date  . NO PAST SURGERIES      Social History   Socioeconomic History  . Marital status: Married    Spouse name: [name]  . Number of children: 2  . Years of education: Not on file  . Highest education level: Not on file  Social Needs  . Financial resource strain: Not on file  . Food insecurity - worry: Not on file  . Food insecurity - inability: Not on file  . Transportation needs - medical: Not on file  . Transportation needs - non-medical: Not on file  Occupational History  . Occupation: Therapist, art: [employer]  . 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 Smoking status: Never Smoker  . Smokeless tobacco: Never Used  Substance and Sexual Activity  . Alcohol use: No    Alcohol/week: 0.0 oz  . Drug use: No  . Sexual activity: Yes  Other Topics Concern  . Not on file  Social History Narrative   Remarried for the 3rd time w/ a lady for the [place]                   Allergies as of 03/02/2017       Reactions   Penicillins    REACTION: unspecified      Medication List        Accurate as of 03/02/17  1:55 PM. Always use your most recent med list.          aspirin 81 MG tablet Take 81 mg by mouth daily.   CIALIS 20 MG tablet Generic drug:  tadalafil TAKE 0.5-1 TABLETS (10-20 MG TOTAL) BY MOUTH EVERY OTHER DAY AS NEEDED FOR ERECTILE DYSFUNCTION.   cromolyn 4 % ophthalmic solution Commonly known as:  OPTICROM Place 1 drop into both eyes 2 (two) times daily.   etodolac 500 MG tablet Commonly known as:  LODINE Take 1 tablet (500 mg total) by mouth 2 (two) times daily as needed.   fluticasone 50 MCG/ACT nasal spray Commonly known as:  FLONASE Place 2 sprays into both nostrils daily.   glucose blood test strip Check blood sugar no more than twice daily.   losartan-hydrochlorothiazide 100-12.5 MG tablet Commonly known as:  HYZAAR Take 1 tablet by mouth daily.   metFORMIN 1000 MG tablet Commonly known as:  GLUCOPHAGE Take 1 tablet (1,000 mg  total) by mouth 2 (two) times daily with a meal.   onetouch ultrasoft lancets Check blood sugar no more than twice daily.   pravastatin 40 MG tablet Commonly known as:  PRAVACHOL Take 1 tablet (40 mg total) by mouth daily.          Objective:   Physical Exam BP (!) 142/78 (BP Location: Left Arm, Patient Position: Sitting, Cuff Size: Normal)   Pulse 78   Temp 97.8 F (36.6 C) (Oral)   Resp 14   Ht 6\' 4"  (1.93 m)   Wt (!) 335 lb 2 oz (152 kg)   SpO2 98%   BMI 40.79 kg/m  General:   Well developed, well nourished . NAD.  HEENT:  Normocephalic . Face symmetric, atraumatic Lungs:  CTA B Normal respiratory effort, no intercostal retractions, no accessory muscle use. Heart: RRR,  no murmur.  No pretibial edema bilaterally  DIABETIC FEET EXAM: No lower extremity edema Normal pedal pulses bilaterally Skin normal, nails normal, no calluses Pinprick examination of the feet normal. Skin: Not pale. Not  jaundice Neurologic:  alert & oriented X3.  Speech normal, gait appropriate for age and unassisted Psych--  Cognition and judgment appear intact.  Cooperative with normal attention span and concentration.  Behavior appropriate. No anxious or depressed appearing.      Assessment & Plan:  Assessment DM HTN Hyperlipidemia DJD - knees  Morbid obesity OSA, + CPAP  PLAN: DM: Good compliance with metformin, reports not taking Actos for several months, reason?. Has gained a little weight, diet: room for improvement. strongly rec  to stop regular sodas.  Check A1c.  Normal foot exam today, normal eye exam March 2018 per patient HTN: On losartan HCT, recommend to d/w his pharmacist the losartan recall.  Check a BMP High cholesterol: Only on Pravachol, niacin was stopped several months ago.  Check lipids  RTC 6 months, CPX

## 2017-03-02 NOTE — Patient Instructions (Signed)
GO TO THE LAB : Get the blood work     GO TO THE FRONT DESK Schedule your next appointment for a   physical exam in 6 months  Please talk to your pharmacy about the losartan you take, some of the tablets have been recalled   Check the  blood pressure 2   times a month  Be sure your blood pressure is between 110/65 and  135/85. If it is consistently higher or lower, let me know

## 2017-03-02 NOTE — Progress Notes (Signed)
Pre visit review using our clinic review tool, if applicable. No additional management support is needed unless otherwise documented below in the visit note. 

## 2017-04-07 ENCOUNTER — Other Ambulatory Visit: Payer: Self-pay | Admitting: Internal Medicine

## 2017-04-13 ENCOUNTER — Other Ambulatory Visit: Payer: Self-pay | Admitting: Internal Medicine

## 2017-04-14 HISTORY — PX: COLONOSCOPY: SHX174

## 2017-04-20 DIAGNOSIS — G4733 Obstructive sleep apnea (adult) (pediatric): Secondary | ICD-10-CM | POA: Diagnosis not present

## 2017-06-01 ENCOUNTER — Other Ambulatory Visit: Payer: Self-pay | Admitting: Internal Medicine

## 2017-06-08 ENCOUNTER — Other Ambulatory Visit: Payer: Self-pay | Admitting: Internal Medicine

## 2017-06-08 ENCOUNTER — Other Ambulatory Visit: Payer: Self-pay

## 2017-06-08 DIAGNOSIS — N529 Male erectile dysfunction, unspecified: Secondary | ICD-10-CM

## 2017-06-08 MED ORDER — LOSARTAN POTASSIUM-HCTZ 100-12.5 MG PO TABS: 1.0000 | ORAL_TABLET | Freq: Every day | ORAL | 1 refills | Status: DC

## 2017-07-09 ENCOUNTER — Encounter (HOSPITAL_BASED_OUTPATIENT_CLINIC_OR_DEPARTMENT_OTHER): Payer: Self-pay | Admitting: Emergency Medicine

## 2017-07-09 ENCOUNTER — Other Ambulatory Visit: Payer: Self-pay

## 2017-07-09 ENCOUNTER — Emergency Department (HOSPITAL_BASED_OUTPATIENT_CLINIC_OR_DEPARTMENT_OTHER)
Admission: EM | Admit: 2017-07-09 | Discharge: 2017-07-09 | Disposition: A | Discharge status: Home / Self Care | Payer: 59 | Attending: Emergency Medicine | Admitting: Emergency Medicine

## 2017-07-09 DIAGNOSIS — E119 Type 2 diabetes mellitus without complications: Secondary | ICD-10-CM | POA: Insufficient documentation

## 2017-07-09 DIAGNOSIS — Z7984 Long term (current) use of oral hypoglycemic drugs: Secondary | ICD-10-CM | POA: Diagnosis not present

## 2017-07-09 DIAGNOSIS — Z7982 Long term (current) use of aspirin: Secondary | ICD-10-CM | POA: Diagnosis not present

## 2017-07-09 DIAGNOSIS — K439 Ventral hernia without obstruction or gangrene: Secondary | ICD-10-CM | POA: Diagnosis not present

## 2017-07-09 DIAGNOSIS — I10 Essential (primary) hypertension: Secondary | ICD-10-CM | POA: Insufficient documentation

## 2017-07-09 NOTE — Discharge Instructions (Signed)
You may benefit from an abdominal binder, which helps support the abdominal wall and prevent protrusion of the hernia.

## 2017-07-09 NOTE — ED Triage Notes (Signed)
Pt states that he is having a knot on his abd for several months, but he feels like is causing trouble at this time. No fever, nausea or vomiting states it feels like he has to use the bathroom all the time, but no having a BM.

## 2017-07-09 NOTE — ED Provider Notes (Signed)
[facility] DEPT [facility] Provider Note: Georgena Spurling, MD, FACEP  CSN: [id number] MRN: [id number] ARRIVAL: 07/09/17 at [facility]: [place] on the abd   HISTORY OF PRESENT ILLNESS  07/09/17 6:17 AM [name] is a 52 y.o. male with several months of a bulge in his upper abdomen in the midline.  He became concerned that this may represent cancer or some other dangerous condition.  The bulge protrudes when he leans forward or strains.  There is no associated pain.  The bulge always readily reduces when supine.  He has had no nausea, vomiting or diarrhea.  He states he has been moving his bowels without difficulty.    Past Medical History:  Diagnosis Date  . Diabetes mellitus   . Hyperlipemia   . Hypertension   . Obesity   . OSA (obstructive sleep apnea)    on CPAP    Past Surgical History:  Procedure Laterality Date  . NO PAST SURGERIES      Family History  Problem Relation Age of Onset  . Diabetes Mother   . Hypertension Neg Hx   . Coronary artery disease Neg Hx   . Colon cancer Neg Hx   . Prostate cancer Neg Hx     Social History   Tobacco Use  . Smoking status: Never Smoker  . Smokeless tobacco: Never Used  Substance Use Topics  . Alcohol use: No    Alcohol/week: 0.0 oz  . Drug use: No    Prior to Admission medications   Medication Sig Start Date End Date Taking? Authorizing Provider  aspirin 81 MG tablet Take 81 mg by mouth daily.      [provider]  CIALIS 20 MG tablet TAKE 0.5-1 TABLETS (10-20 MG TOTAL) BY MOUTH EVERY OTHER DAY AS NEEDED FOR ERECTILE DYSFUNCTION. 06/09/17   Colon Branch, MD  cromolyn (OPTICROM) 4 % ophthalmic solution Place 1 drop into both eyes 2 (two) times daily. 08/25/16   Colon Branch, MD  etodolac (LODINE) 500 MG tablet Take 1 tablet (500 mg total) by mouth 2 (two) times daily as needed. 06/01/17   Colon Branch, MD  fluticasone [facility]) 50 MCG/ACT nasal spray Place 2 sprays into both nostrils daily.  08/25/16   Colon Branch, MD  glucose blood test strip Check blood sugar no more than twice daily. 10/27/14   Colon Branch, MD  Lancets [facility] ULTRASOFT) lancets Check blood sugar no more than twice daily. 10/27/14   Colon Branch, MD  losartan-hydrochlorothiazide (HYZAAR) 100-12.5 MG tablet Take 1 tablet by mouth daily. 06/08/17   Colon Branch, MD  metFORMIN (GLUCOPHAGE) 1000 MG tablet Take 1 tablet (1,000 mg total) by mouth 2 (two) times daily with a meal. 04/13/17   Colon Branch, MD  pravastatin (PRAVACHOL) 40 MG tablet Take 1 tablet (40 mg total) by mouth daily. 08/25/16   Colon Branch, MD    Allergies Penicillins   REVIEW OF SYSTEMS  Negative except as noted here or in the History of Present Illness.   PHYSICAL EXAMINATION  Initial Vital Signs Blood pressure (!) 158/103, pulse (!) 103, temperature 98.6 F (37 C), temperature source Oral, resp. rate 18, height 6\' 4"  (1.93 m), weight (!) 156.5 kg (345 lb), SpO2 99 %.  Examination General: Well-developed, well-nourished male in no acute distress; appearance consistent with age of record HENT: normocephalic; atraumatic Eyes: pupils equal, round and reactive to light; extraocular muscles intact Neck:  supple Heart: regular rate and rhythm Lungs: clear to auscultation bilaterally Abdomen: soft; nondistended; nontender; nontender supraumbilical ventral hernia which protrudes on sitting upright or with Valsalva but readily reduces when relaxing in the supine position; bowel sounds present Extremities: No deformity; full range of motion; pulses normal Neurologic: Awake, alert and oriented; motor function intact in all extremities and symmetric; no facial droop Skin: Warm and dry Psychiatric: Normal mood and affect   RESULTS  Summary of this visit's results, reviewed by myself:   EKG Interpretation  Date/Time:    Ventricular Rate:    PR Interval:    QRS Duration:   QT Interval:    QTC Calculation:   R Axis:     Text Interpretation:         Laboratory Studies: No results found for this or any previous visit (from the past 24 hour(s)). Imaging Studies: No results found.  ED COURSE  Nursing notes and initial vitals signs, including pulse oximetry, reviewed.  Vitals:   07/09/17 0611  BP: (!) 158/103  Pulse: (!) 103  Resp: 18  Temp: 98.6 F (37 C)  TempSrc: Oral  SpO2: 99%  Weight: (!) 156.5 kg (345 lb)  Height: 6\' 4"  (1.93 m)   Patient was advised that if his hernia is not causing him pain, nausea or bowel dysfunction he may not need to have any further evaluation.  If it is concerning him or becomes symptomatic we will refer him to [facility] surgery.  PROCEDURES    ED DIAGNOSES     ICD-10-CM   1. Ventral hernia without obstruction or gangrene K43.9        Wm Fruchter, MD 07/09/17 [phone]

## 2017-07-20 DIAGNOSIS — G4733 Obstructive sleep apnea (adult) (pediatric): Secondary | ICD-10-CM | POA: Diagnosis not present

## 2017-07-22 DIAGNOSIS — G4733 Obstructive sleep apnea (adult) (pediatric): Secondary | ICD-10-CM | POA: Diagnosis not present

## 2017-08-03 ENCOUNTER — Other Ambulatory Visit: Payer: Self-pay | Admitting: Internal Medicine

## 2017-08-04 DIAGNOSIS — M542 Cervicalgia: Secondary | ICD-10-CM | POA: Diagnosis not present

## 2017-08-04 DIAGNOSIS — M5127 Other intervertebral disc displacement, lumbosacral region: Secondary | ICD-10-CM | POA: Diagnosis not present

## 2017-08-04 DIAGNOSIS — M791 Myalgia, unspecified site: Secondary | ICD-10-CM | POA: Diagnosis not present

## 2017-08-05 DIAGNOSIS — M791 Myalgia, unspecified site: Secondary | ICD-10-CM | POA: Diagnosis not present

## 2017-08-05 DIAGNOSIS — M542 Cervicalgia: Secondary | ICD-10-CM | POA: Diagnosis not present

## 2017-08-05 DIAGNOSIS — M5127 Other intervertebral disc displacement, lumbosacral region: Secondary | ICD-10-CM | POA: Diagnosis not present

## 2017-08-12 DIAGNOSIS — M542 Cervicalgia: Secondary | ICD-10-CM | POA: Diagnosis not present

## 2017-08-12 DIAGNOSIS — M5127 Other intervertebral disc displacement, lumbosacral region: Secondary | ICD-10-CM | POA: Diagnosis not present

## 2017-08-12 DIAGNOSIS — M791 Myalgia, unspecified site: Secondary | ICD-10-CM | POA: Diagnosis not present

## 2017-08-13 DIAGNOSIS — M5127 Other intervertebral disc displacement, lumbosacral region: Secondary | ICD-10-CM | POA: Diagnosis not present

## 2017-08-13 DIAGNOSIS — M542 Cervicalgia: Secondary | ICD-10-CM | POA: Diagnosis not present

## 2017-08-13 DIAGNOSIS — M791 Myalgia, unspecified site: Secondary | ICD-10-CM | POA: Diagnosis not present

## 2017-08-19 DIAGNOSIS — M542 Cervicalgia: Secondary | ICD-10-CM | POA: Diagnosis not present

## 2017-08-19 DIAGNOSIS — M5127 Other intervertebral disc displacement, lumbosacral region: Secondary | ICD-10-CM | POA: Diagnosis not present

## 2017-08-19 DIAGNOSIS — M791 Myalgia, unspecified site: Secondary | ICD-10-CM | POA: Diagnosis not present

## 2017-08-20 DIAGNOSIS — M5127 Other intervertebral disc displacement, lumbosacral region: Secondary | ICD-10-CM | POA: Diagnosis not present

## 2017-08-20 DIAGNOSIS — M542 Cervicalgia: Secondary | ICD-10-CM | POA: Diagnosis not present

## 2017-08-20 DIAGNOSIS — M791 Myalgia, unspecified site: Secondary | ICD-10-CM | POA: Diagnosis not present

## 2017-08-23 ENCOUNTER — Other Ambulatory Visit: Payer: Self-pay | Admitting: Internal Medicine

## 2017-08-23 DIAGNOSIS — J302 Other seasonal allergic rhinitis: Secondary | ICD-10-CM

## 2017-08-26 DIAGNOSIS — M50223 Other cervical disc displacement at C6-C7 level: Secondary | ICD-10-CM | POA: Diagnosis not present

## 2017-08-26 DIAGNOSIS — M50323 Other cervical disc degeneration at C6-C7 level: Secondary | ICD-10-CM | POA: Diagnosis not present

## 2017-08-26 DIAGNOSIS — M5127 Other intervertebral disc displacement, lumbosacral region: Secondary | ICD-10-CM | POA: Diagnosis not present

## 2017-08-27 DIAGNOSIS — M542 Cervicalgia: Secondary | ICD-10-CM | POA: Diagnosis not present

## 2017-08-27 DIAGNOSIS — M5127 Other intervertebral disc displacement, lumbosacral region: Secondary | ICD-10-CM | POA: Diagnosis not present

## 2017-08-27 DIAGNOSIS — M791 Myalgia, unspecified site: Secondary | ICD-10-CM | POA: Diagnosis not present

## 2017-08-31 ENCOUNTER — Ambulatory Visit (INDEPENDENT_AMBULATORY_CARE_PROVIDER_SITE_OTHER): Payer: 59 | Admitting: Internal Medicine

## 2017-08-31 ENCOUNTER — Encounter: Payer: Self-pay | Admitting: Internal Medicine

## 2017-08-31 VITALS — BP 134/66 | HR 73 | Temp 97.5°F | Resp 14 | Ht 76.0 in | Wt 320.4 lb

## 2017-08-31 DIAGNOSIS — Z789 Other specified health status: Secondary | ICD-10-CM | POA: Diagnosis not present

## 2017-08-31 DIAGNOSIS — E11621 Type 2 diabetes mellitus with foot ulcer: Secondary | ICD-10-CM

## 2017-08-31 DIAGNOSIS — Z0000 Encounter for general adult medical examination without abnormal findings: Secondary | ICD-10-CM | POA: Diagnosis not present

## 2017-08-31 DIAGNOSIS — L97509 Non-pressure chronic ulcer of other part of unspecified foot with unspecified severity: Secondary | ICD-10-CM | POA: Diagnosis not present

## 2017-08-31 LAB — COMPREHENSIVE METABOLIC PANEL
ALT: 16 U/L (ref 0–53)
AST: 12 U/L (ref 0–37)
Albumin: 4.2 g/dL (ref 3.5–5.2)
Alkaline Phosphatase: 48 U/L (ref 39–117)
BUN: 21 mg/dL (ref 6–23)
CHLORIDE: 98 meq/L (ref 96–112)
CO2: 27 mEq/L (ref 19–32)
Calcium: 9.5 mg/dL (ref 8.4–10.5)
Creatinine, Ser: 0.95 mg/dL (ref 0.40–1.50)
GFR: 87.76 mL/min (ref >60.00)
GLUCOSE: 121 mg/dL — AB (ref 70–99)
POTASSIUM: 4 meq/L (ref 3.5–5.1)
SODIUM: 136 meq/L (ref 135–145)
TOTAL PROTEIN: 7.3 g/dL (ref 6.0–8.3)
Total Bilirubin: 0.8 mg/dL (ref 0.2–1.2)

## 2017-08-31 LAB — LIPID PANEL
CHOL/HDL RATIO: 4
Cholesterol: 143 mg/dL (ref 0–200)
HDL: 34.9 mg/dL — AB (ref >39.00)
LDL Cholesterol: 71 mg/dL (ref 0–99)
NonHDL: 107.9
TRIGLYCERIDES: 186 mg/dL — AB (ref 0.0–149.0)
VLDL: 37.2 mg/dL (ref 0.0–40.0)

## 2017-08-31 LAB — HEMOGLOBIN A1C: HEMOGLOBIN A1C: 6.3 % (ref 4.6–6.5)

## 2017-08-31 NOTE — Progress Notes (Signed)
Subjective:    Patient ID: [name], male    DOB: 03-Jul-1965, 52 y.o.   MRN: [id number]  DOS:  08/31/2017 Type of visit - description : Physical exam Interval history: In general feeling well, he is drinking less regular sodas and  has lost significant amount of weight.  Wt Readings from Last 3 Encounters:  08/31/17 (!) 320 lb 6 oz (145.3 kg)  07/09/17 (!) 345 lb (156.5 kg)  03/02/17 (!) 335 lb 2 oz (152 kg)    Review of Systems No major concerns.  Other than above, a 14 point review of systems is negative      Past Medical History:  Diagnosis Date  . Diabetes mellitus   . Hyperlipemia   . Hypertension   . Obesity   . OSA (obstructive sleep apnea)    on CPAP    Past Surgical History:  Procedure Laterality Date  . NO PAST SURGERIES      Social History   Socioeconomic History  . Marital status: Married    Spouse name: [name]  . Number of children: 2  . Years of education: Not on file  . Highest education level: Not on file  Occupational History  . Occupation: Therapist, art: [employer]  . Occupation: Music therapist: [employer]  Social Needs  . Financial resource strain: Not on file  . Food insecurity:    Worry: Not on file    Inability: Not on file  . Transportation needs:    Medical: Not on file    Non-medical: Not on file  Tobacco Use  . Smoking status: Never Smoker  . Smokeless tobacco: Never Used  Substance and Sexual Activity  . Alcohol use: No    Alcohol/week: 0.0 oz  . Drug use: No  . Sexual activity: Yes  Lifestyle  . Physical activity:    Days per week: Not on file    Minutes per session: Not on file  . Stress: Not on file  Relationships  . Social connections:    Talks on phone: Not on file    Gets together: Not on file    Attends religious service: Not on file    Active member of club or organization: Not on file    Attends meetings of clubs or organizations: Not on file    Relationship status:  Not on file  . Intimate partner violence:    Fear of current or ex partner: Not on file    Emotionally abused: Not on file    Physically abused: Not on file    Forced sexual activity: Not on file  Other Topics Concern  . Not on file  Social History Narrative   Remarried for the 3rd time w/ a lady for the [place]   Children:  2010 , 2012              Family History  Problem Relation Age of Onset  . Diabetes Mother   . Hypertension Neg Hx   . Coronary artery disease Neg Hx   . Colon cancer Neg Hx   . Prostate cancer Neg Hx      Allergies as of 08/31/2017      Reactions   Penicillins    REACTION: unspecified      Medication List        Accurate as of 08/31/17  6:09 PM. Always use your most recent med list.  aspirin 81 MG tablet Take 81 mg by mouth daily.   CIALIS 20 MG tablet Generic drug:  tadalafil TAKE 0.5-1 TABLETS (10-20 MG TOTAL) BY MOUTH EVERY OTHER DAY AS NEEDED FOR ERECTILE DYSFUNCTION.   cromolyn 4 % ophthalmic solution Commonly known as:  OPTICROM Place 1 drop into both eyes 2 (two) times daily.   etodolac 500 MG tablet Commonly known as:  LODINE Take 1 tablet (500 mg total) by mouth 2 (two) times daily as needed.   fluticasone 50 MCG/ACT nasal spray Commonly known as:  FLONASE Place 2 sprays into both nostrils daily.   glucose blood test strip Check blood sugar no more than twice daily.   losartan-hydrochlorothiazide 100-12.5 MG tablet Commonly known as:  HYZAAR Take 1 tablet by mouth daily.   metFORMIN 1000 MG tablet Commonly known as:  GLUCOPHAGE Take 1 tablet (1,000 mg total) by mouth 2 (two) times daily with a meal.   onetouch ultrasoft lancets Check blood sugar no more than twice daily.   pravastatin 40 MG tablet Commonly known as:  PRAVACHOL Take 1 tablet (40 mg total) by mouth daily.          Objective:   Physical Exam BP 134/66 (BP Location: Right Arm, Patient Position: Sitting, Cuff Size: Normal)   Pulse  73   Temp (!) 97.5 F (36.4 C) (Oral)   Resp 14   Ht 6\' 4"  (1.93 m)   Wt (!) 320 lb 6 oz (145.3 kg)   SpO2 97%   BMI 39.00 kg/m  General:   Well developed, morbidly obese appearing. NAD.  Neck: No  thyromegaly  HEENT:  Normocephalic . Face symmetric, atraumatic Lungs:  CTA B Normal respiratory effort, no intercostal retractions, no accessory muscle use. Heart: RRR,  no murmur.  No pretibial edema bilaterally  Abdomen:  Not distended, soft, non-tender. No rebound or rigidity. Has a midline upper abdomen bulging area with Valsalva maneuver  Skin: Exposed areas without rash. Not pale. Not jaundice Neurologic:  alert & oriented X3.  Speech normal, gait appropriate for age and unassisted Strength symmetric and appropriate for age.  Psych: Cognition and judgment appear intact.  Cooperative with normal attention span and concentration.  Behavior appropriate. No anxious or depressed appearing.     Assessment & Plan:   Assessment DM HTN Hyperlipidemia DJD - knees  Morbid obesity Neck pain: chronic,see chiropractor on-off OSA, + CPAP Diastases recti   PLAN: DM, currently on metformin only, has lost 25 pounds by simply drinking less regular sodas.  Encouraged to continue his better diet.  Checking labs. HTN: Well-controlled on Hyzaar, checking labs Hyperlipidemia, on Pravachol, check a FLP diastases recti: Observation, immediate medical attention if pain or persistent swelling DJD: Takes ketorolac sporadically, once or twice a week. OSA: Good CPAP compliance RTC 4 months

## 2017-08-31 NOTE — Progress Notes (Signed)
Pre visit review using our clinic review tool, if applicable. No additional management support is needed unless otherwise documented below in the visit note. 

## 2017-08-31 NOTE — Patient Instructions (Signed)
GO TO THE LAB : Get the blood work     GO TO THE FRONT DESK Schedule your next appointment for a checkup in 4 months.  Diabetes: Check your blood sugar  once a day   Check your blood sugar  at different times of the day GOALS: Fasting before a meal 70- 130 2 hours after a meal less than 180    Check the  blood pressure 2 or 3 times a month   Be sure your blood pressure is between 110/65 and  135/85. If it is consistently higher or lower, let me know

## 2017-08-31 NOTE — Assessment & Plan Note (Addendum)
--  Td 2013; pnm 23: 2016; Prevnar at age 52. --prostate ca screening : DRE - PSA within normal 2018 --CCS--  Cscope 12-2014, 10 years  -Labs : CMP, FLP, A1c, MMR (per pt request) --He is very active at work, has improve his diet, drinking less sodas, + weight loss, praised  

## 2017-08-31 NOTE — Assessment & Plan Note (Signed)
PLAN: DM, currently on metformin only, has lost 25 pounds by simply drinking less regular sodas.  Encouraged to continue his better diet.  Checking labs. HTN: Well-controlled on Hyzaar, checking labs Hyperlipidemia, on Pravachol, check a FLP diastases recti: Observation, immediate medical attention if pain or persistent swelling DJD: Takes ketorolac sporadically, once or twice a week. OSA: Good CPAP compliance RTC 4 months

## 2017-09-01 LAB — MEASLES/MUMPS/RUBELLA IMMUNITY
Mumps IgG: 94.6 AU/mL
Rubella: <0.9 index — ABNORMAL LOW
Rubeola IgG: 277 AU/mL

## 2017-09-02 DIAGNOSIS — M542 Cervicalgia: Secondary | ICD-10-CM | POA: Diagnosis not present

## 2017-09-02 DIAGNOSIS — M791 Myalgia, unspecified site: Secondary | ICD-10-CM | POA: Diagnosis not present

## 2017-09-02 DIAGNOSIS — M50223 Other cervical disc displacement at C6-C7 level: Secondary | ICD-10-CM | POA: Diagnosis not present

## 2017-09-03 ENCOUNTER — Telehealth: Payer: Self-pay | Admitting: Internal Medicine

## 2017-09-03 DIAGNOSIS — M50223 Other cervical disc displacement at C6-C7 level: Secondary | ICD-10-CM | POA: Diagnosis not present

## 2017-09-03 DIAGNOSIS — M545 Low back pain: Secondary | ICD-10-CM | POA: Diagnosis not present

## 2017-09-03 DIAGNOSIS — M791 Myalgia, unspecified site: Secondary | ICD-10-CM | POA: Diagnosis not present

## 2017-09-03 NOTE — Telephone Encounter (Signed)
Copied from [place] [id number]. Topic: General - Other >> Sep 03, 2017  2:34 PM Yvette Rack wrote: Reason for CRM: pt calling about lab results pt is in at work leave a message

## 2017-09-03 NOTE — Telephone Encounter (Signed)
Pt. Called for lab results; results given.  See result note.

## 2017-09-09 DIAGNOSIS — M545 Low back pain: Secondary | ICD-10-CM | POA: Diagnosis not present

## 2017-09-09 DIAGNOSIS — M791 Myalgia, unspecified site: Secondary | ICD-10-CM | POA: Diagnosis not present

## 2017-09-09 DIAGNOSIS — M50223 Other cervical disc displacement at C6-C7 level: Secondary | ICD-10-CM | POA: Diagnosis not present

## 2017-09-10 DIAGNOSIS — M791 Myalgia, unspecified site: Secondary | ICD-10-CM | POA: Diagnosis not present

## 2017-09-10 DIAGNOSIS — M545 Low back pain: Secondary | ICD-10-CM | POA: Diagnosis not present

## 2017-09-10 DIAGNOSIS — M50223 Other cervical disc displacement at C6-C7 level: Secondary | ICD-10-CM | POA: Diagnosis not present

## 2017-09-14 DIAGNOSIS — M545 Low back pain: Secondary | ICD-10-CM | POA: Diagnosis not present

## 2017-09-14 DIAGNOSIS — M50223 Other cervical disc displacement at C6-C7 level: Secondary | ICD-10-CM | POA: Diagnosis not present

## 2017-09-14 DIAGNOSIS — M791 Myalgia, unspecified site: Secondary | ICD-10-CM | POA: Diagnosis not present

## 2017-09-15 ENCOUNTER — Encounter: Payer: Self-pay | Admitting: Internal Medicine

## 2017-09-15 ENCOUNTER — Ambulatory Visit (INDEPENDENT_AMBULATORY_CARE_PROVIDER_SITE_OTHER): Payer: 59 | Admitting: *Deleted

## 2017-09-15 ENCOUNTER — Telehealth: Payer: Self-pay | Admitting: *Deleted

## 2017-09-15 DIAGNOSIS — M545 Low back pain: Secondary | ICD-10-CM | POA: Diagnosis not present

## 2017-09-15 DIAGNOSIS — M50223 Other cervical disc displacement at C6-C7 level: Secondary | ICD-10-CM | POA: Diagnosis not present

## 2017-09-15 DIAGNOSIS — Z23 Encounter for immunization: Secondary | ICD-10-CM | POA: Diagnosis not present

## 2017-09-15 DIAGNOSIS — M791 Myalgia, unspecified site: Secondary | ICD-10-CM | POA: Diagnosis not present

## 2017-09-15 NOTE — Telephone Encounter (Signed)
Left message for pt to check mychart message. Need to verify previous address for possible NCIR match when documenting the MMR booster.

## 2017-09-15 NOTE — Progress Notes (Addendum)
Pt here for MMR booster per Dr [name].  MMR given SQ, right arm and pt tolerated injection well.  [name], MD

## 2017-09-21 ENCOUNTER — Encounter: Payer: Self-pay | Admitting: Internal Medicine

## 2017-09-22 ENCOUNTER — Other Ambulatory Visit: Payer: Self-pay | Admitting: Internal Medicine

## 2017-09-22 MED ORDER — TADALAFIL 20 MG PO TABS: 10.0000 mg | ORAL_TABLET | Freq: Every day | ORAL | 2 refills | Status: DC | PRN

## 2017-09-22 NOTE — Telephone Encounter (Signed)
NCIR has been updated. ?

## 2017-09-23 DIAGNOSIS — M545 Low back pain: Secondary | ICD-10-CM | POA: Diagnosis not present

## 2017-09-23 DIAGNOSIS — M50223 Other cervical disc displacement at C6-C7 level: Secondary | ICD-10-CM | POA: Diagnosis not present

## 2017-09-23 DIAGNOSIS — M791 Myalgia, unspecified site: Secondary | ICD-10-CM | POA: Diagnosis not present

## 2017-09-24 DIAGNOSIS — M50223 Other cervical disc displacement at C6-C7 level: Secondary | ICD-10-CM | POA: Diagnosis not present

## 2017-09-24 DIAGNOSIS — M545 Low back pain: Secondary | ICD-10-CM | POA: Diagnosis not present

## 2017-09-24 DIAGNOSIS — M791 Myalgia, unspecified site: Secondary | ICD-10-CM | POA: Diagnosis not present

## 2017-10-12 ENCOUNTER — Other Ambulatory Visit: Payer: Self-pay | Admitting: Internal Medicine

## 2017-10-20 DIAGNOSIS — G4733 Obstructive sleep apnea (adult) (pediatric): Secondary | ICD-10-CM | POA: Diagnosis not present

## 2017-11-13 ENCOUNTER — Encounter (HOSPITAL_COMMUNITY): Payer: Self-pay

## 2017-11-13 ENCOUNTER — Ambulatory Visit (HOSPITAL_COMMUNITY)
Admission: EM | Admit: 2017-11-13 | Discharge: 2017-11-13 | Disposition: A | Discharge status: Home / Self Care | Payer: 59 | Attending: Family Medicine | Admitting: Family Medicine

## 2017-11-13 ENCOUNTER — Ambulatory Visit: Payer: Self-pay | Admitting: *Deleted

## 2017-11-13 DIAGNOSIS — R69 Illness, unspecified: Secondary | ICD-10-CM | POA: Diagnosis not present

## 2017-11-13 DIAGNOSIS — J111 Influenza due to unidentified influenza virus with other respiratory manifestations: Secondary | ICD-10-CM

## 2017-11-13 LAB — POCT URINALYSIS DIP (DEVICE)
Glucose, UA: NEGATIVE mg/dL
HGB URINE DIPSTICK: NEGATIVE
KETONES UR: NEGATIVE mg/dL
Leukocytes, UA: NEGATIVE
NITRITE: NEGATIVE
PH: 5.5 (ref 5.0–8.0)
PROTEIN: NEGATIVE mg/dL
SPECIFIC GRAVITY, URINE: 1.02 (ref 1.005–1.030)
Urobilinogen, UA: 0.2 mg/dL (ref 0.0–1.0)

## 2017-11-13 LAB — POCT I-STAT, CHEM 8
BUN: 13 mg/dL (ref 6–20)
CALCIUM ION: 1.16 mmol/L (ref 1.15–1.40)
CHLORIDE: 99 mmol/L (ref 98–111)
CREATININE: 1 mg/dL (ref 0.61–1.24)
GLUCOSE: 220 mg/dL — AB (ref 70–99)
HCT: 39 % (ref 39.0–52.0)
Hemoglobin: 13.3 g/dL (ref 13.0–17.0)
Potassium: 3.3 mmol/L — ABNORMAL LOW (ref 3.5–5.1)
Sodium: 136 mmol/L (ref 135–145)
TCO2: 23 mmol/L (ref 22–32)

## 2017-11-13 MED ORDER — DOXYCYCLINE HYCLATE 100 MG PO CAPS: 100.0000 mg | ORAL_CAPSULE | Freq: Two times a day (BID) | ORAL | 0 refills | Status: DC

## 2017-11-13 NOTE — Telephone Encounter (Signed)
Pt calling with complaints of BP being 185/75 in left arm and 175/85 in right arm with a heart rate of 120. Pt's states that his wife took his temperature under his arm and it was 99.0. Pt states he started to experience chills, headache, dizziness and feeling like his face was on fire today. Pt states that symptoms began today, which required him to leave work early. Pt states he has not been around any body that has been sick recently.  Pt states he did take the [facility] version of Advil Cold once he arrived home from work. Pt advised to seek treatment in Urgent Care for current symptoms. Pt verbalized understanding.   Reason for Disposition . Systolic BP  >= 956 OR Diastolic >= 387  Answer Assessment - Initial Assessment Questions 1. DESCRIPTION: "Describe your dizziness."     Feels dizziness that comes and goes 2. LIGHTHEADED: "Do you feel lightheaded?" (e.g., somewhat faint, woozy, weak upon standing)     yes 3. VERTIGO: "Do you feel like either you or the room is spinning or tilting?" (i.e. vertigo)     Pt did not voice this complaint 4. SEVERITY: "How bad is it?"  "Do you feel like you are going to faint?" "Can you stand and walk?"   - MILD - walking normally   - MODERATE - interferes with normal activities (e.g., work, school)    - SEVERE - unable to stand, requires support to walk, feels like passing out now.      Severity not stated 5. ONSET:  "When did the dizziness begin?"     Began today while at work around 11:30, which required him to leave work early 74. AGGRAVATING FACTORS: "Does anything make it worse?" (e.g., standing, change in head position)     Not stated 7. HEART RATE: "Can you tell me your heart rate?" "How many beats in 15 seconds?"  (Note: not all patients can do this)       120 when checked with automatic BP cuff 8. CAUSE: "What do you think is causing the dizziness?"     unknown 9. RECURRENT SYMPTOM: "Have you had dizziness before?" If so, ask: "When was the last  time?" "What happened that time?"     Not before today 10. OTHER SYMPTOMS: "Do you have any other symptoms?" (e.g., fever, chest pain, vomiting, diarrhea, bleeding)       Chills, headache, "face feels like it is on fire" and eyes are burning  Answer Assessment - Initial Assessment Questions 1. BLOOD PRESSURE: "What is the blood pressure?" "Did you take at least two measurements 5 minutes apart?"     Left arm 185/75 and right arm 175/85 2. ONSET: "When did you take your blood pressure?"     About 10-15 before calling the office 3. HOW: "How did you obtain the blood pressure?" (e.g., visiting nurse, automatic home BP monitor)     Automatic home BP monitor 4. HISTORY: "Do you have a history of high blood pressure?"     yes 5. MEDICATIONS: "Are you taking any medications for blood pressure?" "Have you missed any doses recently?"     Yes taking BP meds, pt does not state that he has missed any doses recenly 6. OTHER SYMPTOMS: "Do you have any symptoms?" (e.g., headache, chest pain, blurred vision, difficulty breathing, weakness)     Headache, dizziness, chills and feels like face is on fire  Protocols used: HIGH BLOOD PRESSURE-A-AH, DIZZINESS - [facility]

## 2017-11-13 NOTE — ED Triage Notes (Signed)
Pt presents with fever, dizziness, body aches and high blood pressure

## 2017-11-13 NOTE — Discharge Instructions (Addendum)
Take Tylenol for fever greater than 101. Drink plenty of fluids.  Call or return if symptoms worsen

## 2017-11-13 NOTE — ED Provider Notes (Signed)
[facility]    CSN: [id number] Arrival date & time: 11/13/17  1743     History   Chief Complaint Chief Complaint  Patient presents with  . Fever  . Dizziness  . Generalized Body Aches  . Hypertension    HPI HEZZIE KARIM is a 52 y.o. male.   Patient has fever general myalgias.  Denies any respiratory symptoms but has had some urinary symptoms where he has urge but only voids small amounts he also has some left-sided lumbar tenderness.  He denies tick exposure.  HPI  Past Medical History:  Diagnosis Date  . Diabetes mellitus   . Hyperlipemia   . Hypertension   . Obesity   . OSA (obstructive sleep apnea)    on CPAP    Patient Active Problem List   Diagnosis Date Noted  . PCP NOTES >>>>>>>> 07/09/2015  . DJD (degenerative joint disease) 10/27/2014  . Screening for heart disease 06/28/2012  . OSA (obstructive sleep apnea) 06/14/2012  . ED (erectile dysfunction) 04/26/2012  . Annual physical exam 12/29/2011  . Achilles tendinitis 09/10/2010  . Allergic rhinitis 07/12/2010  . Hyperlipidemia 04/26/2007  . DM II (diabetes mellitus, type II), controlled ([place]) 04/22/2006  . OBESITY NOS 04/22/2006  . Essential hypertension 04/22/2006    Past Surgical History:  Procedure Laterality Date  . NO PAST SURGERIES         Home Medications    Prior to Admission medications   Medication Sig Start Date End Date Taking? Authorizing Provider  aspirin 81 MG tablet Take 81 mg by mouth daily.      [provider]  cromolyn (OPTICROM) 4 % ophthalmic solution Place 1 drop into both eyes 2 (two) times daily. 08/25/16   Colon Branch, MD  etodolac (LODINE) 500 MG tablet Take 1 tablet (500 mg total) by mouth 2 (two) times daily as needed. 10/12/17   Colon Branch, MD  fluticasone [facility]) 50 MCG/ACT nasal spray Place 2 sprays into both nostrils daily. 08/24/17   Colon Branch, MD  glucose blood test strip Check blood sugar no more than twice daily. 10/27/14   Colon Branch, MD   Lancets [facility] ULTRASOFT) lancets Check blood sugar no more than twice daily. 10/27/14   Colon Branch, MD  losartan-hydrochlorothiazide (HYZAAR) 100-12.5 MG tablet Take 1 tablet by mouth daily. 06/08/17   Colon Branch, MD  metFORMIN (GLUCOPHAGE) 1000 MG tablet Take 1 tablet (1,000 mg total) by mouth 2 (two) times daily with a meal. 09/22/17   Colon Branch, MD  pravastatin (PRAVACHOL) 40 MG tablet Take 1 tablet (40 mg total) by mouth daily. 08/03/17   Colon Branch, MD  tadalafil (ADCIRCA/CIALIS) 20 MG tablet Take 0.5-1 tablets (10-20 mg total) by mouth daily as needed for erectile dysfunction. 09/22/17   Colon Branch, MD    Family History Family History  Problem Relation Age of Onset  . Diabetes Mother   . Hypertension Neg Hx   . Coronary artery disease Neg Hx   . Colon cancer Neg Hx   . Prostate cancer Neg Hx     Social History Social History   Tobacco Use  . Smoking status: Never Smoker  . Smokeless tobacco: Never Used  Substance Use Topics  . Alcohol use: No  . Drug use: No     Allergies   Penicillins   Review of Systems Review of Systems  Constitutional: Positive for fever.  HENT: Positive for congestion.  Respiratory: Negative.   Cardiovascular: Negative.   Gastrointestinal: Negative.   Genitourinary: Positive for decreased urine volume and dysuria.     Physical Exam Triage Vital Signs ED Triage Vitals [11/13/17 1844]  Enc Vitals Group     BP 133/70     Pulse Rate (!) 124     Resp (!) 22     Temp (!) 101.6 F (38.7 C)     Temp Source Temporal     SpO2 97 %     Weight      Height      Head Circumference      Peak Flow      Pain Score      Pain Loc      Pain Edu?      Excl. in [place]?    No data found.  Updated Vital Signs BP 133/70 (BP Location: Left Arm)   Pulse (!) 124   Temp (!) 101.6 F (38.7 C) (Temporal)   Resp (!) 22   SpO2 97%   Visual Acuity Right Eye Distance:   Left Eye Distance:   Bilateral Distance:    Right Eye Near:   Left Eye  Near:    Bilateral Near:     Physical Exam  Constitutional: He is oriented to person, place, and time. He appears well-developed and well-nourished.  HENT:  Head: Normocephalic.  Cardiovascular: Normal rate, regular rhythm and normal heart sounds.  Pulmonary/Chest: Effort normal and breath sounds normal.  Abdominal: Soft.  Musculoskeletal: Normal range of motion.  Neurological: He is alert and oriented to person, place, and time.     UC Treatments / Results  Labs (all labs ordered are listed, but only abnormal results are displayed) Labs Reviewed  POCT I-STAT, CHEM 8 - Abnormal; Notable for the following components:      Result Value   Potassium 3.3 (*)    Glucose, Bld 220 (*)    All other components within normal limits  POCT URINALYSIS DIP (DEVICE) - Abnormal; Notable for the following components:   Bilirubin Urine LARGE (*)    All other components within normal limits    EKG None  Radiology No results found.  Procedures Procedures (including critical care time)  Medications Ordered in UC Medications - No data to display  Initial Impression / Assessment and Plan / UC Course  I have reviewed the triage vital signs and the nursing notes.  Pertinent labs & imaging results that were available during my care of the patient were reviewed by me and considered in my medical decision making (see chart for details).      Final Clinical Impressions(s) / UC Diagnoses  Acute febrile illness.  This could be viral such as summertime flu but I am concerned even though there is no evidence of tick bite about tickborne illness.  For that reason I have offered him a course of doxycycline.  He may take Tylenol for fever and myalgias. Final diagnoses:  None   Discharge Instructions   None    ED Prescriptions    None     Controlled Substance Prescriptions [place] Controlled Substance Registry consulted? No   Wardell Honour, MD 11/13/17 [phone]

## 2017-11-16 ENCOUNTER — Ambulatory Visit (INDEPENDENT_AMBULATORY_CARE_PROVIDER_SITE_OTHER): Payer: 59 | Admitting: Internal Medicine

## 2017-11-16 VITALS — BP 128/70 | HR 98 | Temp 97.7°F | Resp 16 | Ht 76.0 in | Wt 312.2 lb

## 2017-11-16 DIAGNOSIS — M545 Low back pain, unspecified: Secondary | ICD-10-CM

## 2017-11-16 DIAGNOSIS — B349 Viral infection, unspecified: Secondary | ICD-10-CM | POA: Diagnosis not present

## 2017-11-16 MED ORDER — CYCLOBENZAPRINE HCL 10 MG PO TABS: 10.0000 mg | ORAL_TABLET | Freq: Two times a day (BID) | ORAL | 0 refills | Status: DC | PRN

## 2017-11-16 NOTE — Patient Instructions (Addendum)
Rest, fluids , tylenol  Take the antibiotic as prescribed for only 7 days  Call if not gradually better over the next  10 days  Call anytime if the symptoms are severe, you have high fever, short of breath, chest pain  For back pain, take flexeril twice a day as needed, will cause drowsiness.   call if not improving

## 2017-11-16 NOTE — Progress Notes (Signed)
Subjective:    Patient ID: [name], male    DOB: Oct 15, 1965, 52 y.o.   MRN: [id number]  DOS:  11/16/2017 Type of visit - description : ER follow-up Interval history: Sxsxstarted 11/12/2017 with aches, dizziness, lightheadedness, elevated BP and heart rate. He also developed some left sided low back pain that persist until nown. Went to the ER 11/13/2017, Udip was (-), was treated empirically with doxycycline for possible Lyme disease although he had no known tick exposure. The next day he developed a rash at the right calf, the area was not  tender or warm. Overall since then he has improved. Denies at this point dizziness ; lightheadedness have stop.  He has been able to work.  As far as the back pain, is in the left lower back, no radiation, worse with certain movements.  Wt Readings from Last 3 Encounters:  11/16/17 (!) 312 lb 4 oz (141.6 kg)  08/31/17 (!) 320 lb 6 oz (145.3 kg)  07/09/17 (!) 345 lb (156.5 kg)    Review of Systems Other than the calf redness, no other rashes. No headaches No nausea, vomiting, diarrhea No sick contacts or trips. No cough or sinus congestion.  Past Medical History:  Diagnosis Date  . Diabetes mellitus   . Hyperlipemia   . Hypertension   . Obesity   . OSA (obstructive sleep apnea)    on CPAP    Past Surgical History:  Procedure Laterality Date  . NO PAST SURGERIES      Social History   Socioeconomic History  . Marital status: Married    Spouse name: [name]  . Number of children: 2  . Years of education: Not on file  . Highest education level: Not on file  Occupational History  . Occupation: Therapist, art: [employer]  . Occupation: Music therapist: [employer]  Social Needs  . Financial resource strain: Not on file  . Food insecurity:    Worry: Not on file    Inability: Not on file  . Transportation needs:    Medical: Not on file    Non-medical: Not on file  Tobacco Use  . Smoking  status: Never Smoker  . Smokeless tobacco: Never Used  Substance and Sexual Activity  . Alcohol use: No  . Drug use: No  . Sexual activity: Yes  Lifestyle  . Physical activity:    Days per week: Not on file    Minutes per session: Not on file  . Stress: Not on file  Relationships  . Social connections:    Talks on phone: Not on file    Gets together: Not on file    Attends religious service: Not on file    Active member of club or organization: Not on file    Attends meetings of clubs or organizations: Not on file    Relationship status: Not on file  . Intimate partner violence:    Fear of current or ex partner: Not on file    Emotionally abused: Not on file    Physically abused: Not on file    Forced sexual activity: Not on file  Other Topics Concern  . Not on file  Social History Narrative   Remarried for the 3rd time w/ a lady for the [place]   Children:  2010 , 2012               Allergies as of 11/16/2017  Reactions   Penicillins    REACTION: unspecified      Medication List        Accurate as of 11/16/17  7:59 PM. Always use your most recent med list.          aspirin 81 MG tablet Take 81 mg by mouth daily.   cromolyn 4 % ophthalmic solution Commonly known as:  OPTICROM Place 1 drop into both eyes 2 (two) times daily.   cyclobenzaprine 10 MG tablet Commonly known as:  FLEXERIL Take 1 tablet (10 mg total) by mouth 2 (two) times daily as needed for muscle spasms.   doxycycline 100 MG capsule Commonly known as:  VIBRAMYCIN Take 1 capsule (100 mg total) by mouth 2 (two) times daily.   etodolac 500 MG tablet Commonly known as:  LODINE Take 1 tablet (500 mg total) by mouth 2 (two) times daily as needed.   fluticasone 50 MCG/ACT nasal spray Commonly known as:  FLONASE Place 2 sprays into both nostrils daily.   glucose blood test strip Check blood sugar no more than twice daily.   losartan-hydrochlorothiazide 100-12.5 MG tablet Commonly  known as:  HYZAAR Take 1 tablet by mouth daily.   metFORMIN 1000 MG tablet Commonly known as:  GLUCOPHAGE Take 1 tablet (1,000 mg total) by mouth 2 (two) times daily with a meal.   onetouch ultrasoft lancets Check blood sugar no more than twice daily.   pravastatin 40 MG tablet Commonly known as:  PRAVACHOL Take 1 tablet (40 mg total) by mouth daily.   tadalafil 20 MG tablet Commonly known as:  ADCIRCA/CIALIS Take 0.5-1 tablets (10-20 mg total) by mouth daily as needed for erectile dysfunction.          Objective:   Physical Exam  Skin:      BP 128/70 (BP Location: Left Arm, Patient Position: Sitting, Cuff Size: Normal)   Pulse 98   Temp 97.7 F (36.5 C) (Oral)   Resp 16   Ht 6\' 4"  (1.93 m)   Wt (!) 312 lb 4 oz (141.6 kg)   SpO2 91%   BMI 38.01 kg/m  General:   Well developed, NAD, see BMI.  HEENT:  Normocephalic . Face symmetric, atraumatic.  Nose not congested, sinuses no TTP Lungs:  CTA B Normal respiratory effort, no intercostal retractions, no accessory muscle use. Heart: RRR,  no murmur.  no pretibial edema bilaterally.  Calves symmetric Abdomen:  Not distended, soft, non-tender. No rebound or rigidity.   Neurologic:  alert & oriented X3.  Speech normal, gait appropriate for age and unassisted Psych--  Cognition and judgment appear intact.  Cooperative with normal attention span and concentration.  Behavior appropriate. No anxious or depressed appearing.     Assessment & Plan:   Assessment DM HTN Hyperlipidemia DJD - knees  Morbid obesity Neck pain: chronic,see chiropractor on-off OSA, + CPAP Diastases recti   PLAN: Viral syndrome? Felt unwell 4 days ago, was seen at the ER, viral syndrome?  On empiric doxycycline.  He is feeling improved today.  Exam is benign except for mild redness at the calf but is not consistent with cellulitis.  At this point recommend to continue antibiotics for a total of 7 days, rest, fluids, and call if not  improving. Low back pain: Sounds MSK, doubt related to febrile process, recommend rest, Flexeril, Tylenol.  Call if no better.

## 2017-11-16 NOTE — Assessment & Plan Note (Signed)
Viral syndrome? Felt unwell 4 days ago, was seen at the ER, viral syndrome?  On empiric doxycycline.  He is feeling improved today.  Exam is benign except for mild redness at the calf but is not consistent with cellulitis.  At this point recommend to continue antibiotics for a total of 7 days, rest, fluids, and call if not improving. Low back pain: Sounds MSK, doubt related to febrile process, recommend rest, Flexeril, Tylenol.  Call if no better.

## 2017-11-16 NOTE — Progress Notes (Signed)
Pre visit review using our clinic review tool, if applicable. No additional management support is needed unless otherwise documented below in the visit note. 

## 2017-11-25 ENCOUNTER — Other Ambulatory Visit: Payer: Self-pay | Admitting: Internal Medicine

## 2018-01-04 ENCOUNTER — Other Ambulatory Visit: Payer: Self-pay | Admitting: Internal Medicine

## 2018-01-11 ENCOUNTER — Ambulatory Visit: Payer: 59 | Admitting: Internal Medicine

## 2018-01-18 DIAGNOSIS — G4733 Obstructive sleep apnea (adult) (pediatric): Secondary | ICD-10-CM | POA: Diagnosis not present

## 2018-02-22 ENCOUNTER — Telehealth: Payer: Self-pay

## 2018-02-22 MED ORDER — LOSARTAN POTASSIUM 100 MG PO TABS: 100.0000 mg | ORAL_TABLET | Freq: Every day | ORAL | 1 refills | Status: DC

## 2018-02-22 MED ORDER — HYDROCHLOROTHIAZIDE 12.5 MG PO TABS: 12.5000 mg | ORAL_TABLET | Freq: Every day | ORAL | 1 refills | Status: DC

## 2018-02-22 NOTE — Telephone Encounter (Signed)
Rx's sent. MyChart message sent to Pt.

## 2018-02-22 NOTE — Telephone Encounter (Signed)
Yes but let the patient know about the change even if the pharmacy is supposed to do so

## 2018-02-22 NOTE — Telephone Encounter (Signed)
Received [facility] from [facility]- losartan-hctz on back order- okay to change to 2 separate scripts?

## 2018-04-12 ENCOUNTER — Other Ambulatory Visit: Payer: Self-pay | Admitting: Internal Medicine

## 2018-04-19 DIAGNOSIS — G4733 Obstructive sleep apnea (adult) (pediatric): Secondary | ICD-10-CM | POA: Diagnosis not present

## 2018-04-23 ENCOUNTER — Other Ambulatory Visit: Payer: Self-pay | Admitting: Internal Medicine

## 2018-05-06 DIAGNOSIS — G4733 Obstructive sleep apnea (adult) (pediatric): Secondary | ICD-10-CM | POA: Diagnosis not present

## 2018-05-15 ENCOUNTER — Other Ambulatory Visit: Payer: Self-pay | Admitting: Internal Medicine

## 2018-06-28 ENCOUNTER — Other Ambulatory Visit: Payer: Self-pay | Admitting: Internal Medicine

## 2018-07-12 DIAGNOSIS — L570 Actinic keratosis: Secondary | ICD-10-CM | POA: Diagnosis not present

## 2018-07-12 DIAGNOSIS — D485 Neoplasm of uncertain behavior of skin: Secondary | ICD-10-CM | POA: Diagnosis not present

## 2018-07-12 DIAGNOSIS — L821 Other seborrheic keratosis: Secondary | ICD-10-CM | POA: Diagnosis not present

## 2018-07-19 DIAGNOSIS — G4733 Obstructive sleep apnea (adult) (pediatric): Secondary | ICD-10-CM | POA: Diagnosis not present

## 2018-07-28 ENCOUNTER — Other Ambulatory Visit: Payer: Self-pay | Admitting: Internal Medicine

## 2018-07-30 ENCOUNTER — Other Ambulatory Visit: Payer: Self-pay | Admitting: Internal Medicine

## 2018-07-30 MED ORDER — LOSARTAN POTASSIUM 100 MG PO TABS: 100.0000 mg | ORAL_TABLET | Freq: Every day | ORAL | 0 refills | Status: DC

## 2018-07-30 MED ORDER — HYDROCHLOROTHIAZIDE 12.5 MG PO TABS: 12.5000 mg | ORAL_TABLET | Freq: Every day | ORAL | 0 refills | Status: DC

## 2018-08-17 ENCOUNTER — Ambulatory Visit (INDEPENDENT_AMBULATORY_CARE_PROVIDER_SITE_OTHER): Payer: 59 | Admitting: Internal Medicine

## 2018-08-17 ENCOUNTER — Encounter: Payer: Self-pay | Admitting: Internal Medicine

## 2018-08-17 ENCOUNTER — Other Ambulatory Visit: Payer: Self-pay

## 2018-08-17 DIAGNOSIS — E785 Hyperlipidemia, unspecified: Secondary | ICD-10-CM

## 2018-08-17 DIAGNOSIS — E118 Type 2 diabetes mellitus with unspecified complications: Secondary | ICD-10-CM

## 2018-08-17 DIAGNOSIS — I10 Essential (primary) hypertension: Secondary | ICD-10-CM | POA: Diagnosis not present

## 2018-08-17 NOTE — Progress Notes (Signed)
Subjective:    Patient ID: [name], male    DOB: July 10, 1965, 53 y.o.   MRN: [id number]  DOS:  08/17/2018 Type of visit - description:  Attempted  to make this a video visit, due to technical difficulties from the patient side it was not possible  thus we proceeded with a Virtual Visit via Telephone    I connected with@ on 08/17/18 at  5:00 PM EDT by telephone and verified that I am speaking with the correct person using two identifiers.  THIS ENCOUNTER IS A VIRTUAL VISIT DUE TO COVID-19 - PATIENT WAS NOT SEEN IN THE OFFICE. PATIENT HAS CONSENTED TO VIRTUAL VISIT / TELEMEDICINE VISIT   Location of patient: home  Location of provider: office  I discussed the limitations, risks, security and privacy concerns of performing an evaluation and management service by telephone and the availability of in person appointments. I also discussed with the patient that there may be a patient responsible charge related to this service. The patient expressed understanding and agreed to proceed.   History of Present Illness: Routine visit, last seen 11-2017 HTN: Good med compliance, no ambulatory BPs DM: Continue to do very well with diet, eating much healthier, on his own scales he has lost some weight. Knee pain: Still takes etodolac, usually 3 or 4 times a week with good results and no apparent side effects. COVID-19: He continue working, reports that his environment is as safe as it can be.   Review of Systems Denies fever chills No chest pain no difficulty breathing Not nausea, vomiting, diarrhea. No cough  Past Medical History:  Diagnosis Date  . Diabetes mellitus   . Hyperlipemia   . Hypertension   . Obesity   . OSA (obstructive sleep apnea)    on CPAP    Past Surgical History:  Procedure Laterality Date  . NO PAST SURGERIES      Social History   Socioeconomic History  . Marital status: Married    Spouse name: [name]  . Number of children: 2  . Years of education: Not on  file  . Highest education level: Not on file  Occupational History  . 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 Occupation: Music therapist: [employer]  Social Needs  . Financial resource strain: Not on file  . Food insecurity:    Worry: Not on file    Inability: Not on file  . Transportation needs:    Medical: Not on file    Non-medical: Not on file  Tobacco Use  . Smoking status: Never Smoker  . Smokeless tobacco: Never Used  Substance and Sexual Activity  . Alcohol use: No  . Drug use: No  . Sexual activity: Yes  Lifestyle  . Physical activity:    Days per week: Not on file    Minutes per session: Not on file  . Stress: Not on file  Relationships  . Social connections:    Talks on phone: Not on file    Gets together: Not on file    Attends religious service: Not on file    Active member of club or organization: Not on file    Attends meetings of clubs or organizations: Not on file    Relationship status: Not on file  . Intimate partner violence:    Fear of current or ex partner: Not on file    Emotionally abused: Not on file    Physically abused:  Not on file    Forced sexual activity: Not on file  Other Topics Concern  . Not on file  Social History Narrative   Remarried for the 3rd time w/ a lady for the [place]   Children:  2010 , 2012               Allergies as of 08/17/2018      Reactions   Penicillins    REACTION: unspecified      Medication List       Accurate as of Aug 17, 2018  4:59 PM. Always use your most recent med list.        aspirin 81 MG tablet Take 81 mg by mouth daily.   cromolyn 4 % ophthalmic solution Commonly known as:  OPTICROM Place 1 drop into both eyes 2 (two) times daily.   cyclobenzaprine 10 MG tablet Commonly known as:  FLEXERIL Take 1 tablet (10 mg total) by mouth 2 (two) times daily as needed for muscle spasms.   doxycycline 100 MG capsule Commonly known as:  VIBRAMYCIN Take 1  capsule (100 mg total) by mouth 2 (two) times daily.   etodolac 500 MG tablet Commonly known as:  LODINE Take 1 tablet (500 mg total) by mouth 2 (two) times daily as needed.   fluticasone 50 MCG/ACT nasal spray Commonly known as:  FLONASE Place 2 sprays into both nostrils daily.   glucose blood test strip Check blood sugar no more than twice daily.   hydrochlorothiazide 12.5 MG tablet Commonly known as:  HYDRODIURIL Take 1 tablet (12.5 mg total) by mouth daily.   losartan 100 MG tablet Commonly known as:  COZAAR Take 1 tablet (100 mg total) by mouth daily.   metFORMIN 1000 MG tablet Commonly known as:  GLUCOPHAGE Take 1 tablet (1,000 mg total) by mouth 2 (two) times daily with a meal.   onetouch ultrasoft lancets Check blood sugar no more than twice daily.   pravastatin 40 MG tablet Commonly known as:  PRAVACHOL Take 1 tablet (40 mg total) by mouth daily.   tadalafil 20 MG tablet Commonly known as:  ADCIRCA/CIALIS TAKE 0.5-1 TABLETS (10-20 MG TOTAL) BY MOUTH DAILY AS NEEDED FOR ERECTILE DYSFUNCTION.           Objective:   Physical Exam There were no vitals taken for this visit. This is virtual phone visit, he is alert oriented x3, seems in good spirits.  Current weight at home 314 pounds    Assessment     Assessment DM HTN Hyperlipidemia DJD - knees  Morbid obesity Neck pain: chronic,see chiropractor on-off OSA, + CPAP Diastases recti   PLAN:  Virtual visit, phone. HTN: Currently on losartan.  We will check a CMP and CBC.  Encouraged to check his blood pressures regularly. DM: Currently on metformin, check a A1c.  He continued to do better with diet and he is very active at work. High cholesterol: On Pravachol, due for labs, will check. Knee pain: Taking etodolac 3-4 times a week, no apparent side effects, checking kidney a BMP today. COVID-19: Seems to be doing well and following all precautions. Plan: Labs within the next few days CPX in 4 months   I discussed the assessment and treatment plan with the patient. The patient was provided an opportunity to ask questions and all were answered. The patient agreed with the plan and demonstrated an understanding of the instructions.   The patient was advised to call back or seek an in-person evaluation if  the symptoms worsen or if the condition fails to improve as anticipated.  I provided  18 minutes of non-face-to-face time during this encounter.  [name], MD

## 2018-08-18 ENCOUNTER — Other Ambulatory Visit: Payer: Self-pay | Admitting: Internal Medicine

## 2018-08-18 ENCOUNTER — Telehealth: Payer: Self-pay | Admitting: Internal Medicine

## 2018-08-18 NOTE — Assessment & Plan Note (Signed)
Virtual visit, phone. HTN: Currently on losartan.  We will check a CMP and CBC.  Encouraged to check his blood pressures regularly. DM: Currently on metformin, check a A1c.  He continued to do better with diet and he is very active at work. High cholesterol: On Pravachol, due for labs, will check. Knee pain: Taking etodolac 3-4 times a week, no apparent side effects, checking kidney a BMP today. COVID-19: Seems to be doing well and following all precautions. Plan: Labs within the next few days CPX in 4 months

## 2018-08-18 NOTE — Telephone Encounter (Signed)
-----   Message from Colon Branch, MD sent at 08/17/2018  5:14 PM EDT ----- Regarding: labs Sherri Needs labs, he works till  41.0, if we cannot do it here or at [facility] [employer] [facility] (I think [facility] have extended hours), he will make the effort to leave  early from work one day next week.  Kaylyn :  CMP, CBC: HTN FLP: High cholesterol A1c: DM

## 2018-08-18 NOTE — Telephone Encounter (Signed)
Called pt to make an appt. For labs. No answer. Left msg to call us back 8-4 Mon-Fri.

## 2018-08-30 ENCOUNTER — Other Ambulatory Visit (INDEPENDENT_AMBULATORY_CARE_PROVIDER_SITE_OTHER): Payer: 59

## 2018-08-30 ENCOUNTER — Other Ambulatory Visit: Payer: Self-pay

## 2018-08-30 DIAGNOSIS — E785 Hyperlipidemia, unspecified: Secondary | ICD-10-CM | POA: Diagnosis not present

## 2018-08-30 DIAGNOSIS — I10 Essential (primary) hypertension: Secondary | ICD-10-CM | POA: Diagnosis not present

## 2018-08-30 DIAGNOSIS — E118 Type 2 diabetes mellitus with unspecified complications: Secondary | ICD-10-CM | POA: Diagnosis not present

## 2018-08-30 LAB — COMPREHENSIVE METABOLIC PANEL
ALT: 17 U/L (ref 0–53)
AST: 14 U/L (ref 0–37)
Albumin: 4.1 g/dL (ref 3.5–5.2)
Alkaline Phosphatase: 48 U/L (ref 39–117)
BUN: 21 mg/dL (ref 6–23)
CO2: 28 mEq/L (ref 19–32)
Calcium: 9.1 mg/dL (ref 8.4–10.5)
Chloride: 102 mEq/L (ref 96–112)
Creatinine, Ser: 1.14 mg/dL (ref 0.40–1.50)
GFR: 66.66 mL/min (ref >60.00)
Glucose, Bld: 99 mg/dL (ref 70–99)
Potassium: 4 mEq/L (ref 3.5–5.1)
Sodium: 139 mEq/L (ref 135–145)
Total Bilirubin: 1.1 mg/dL (ref 0.2–1.2)
Total Protein: 7 g/dL (ref 6.0–8.3)

## 2018-08-30 LAB — CBC WITH DIFFERENTIAL/PLATELET
Basophils Absolute: 0 10*3/uL (ref 0.0–0.1)
Basophils Relative: 0.7 % (ref 0.0–3.0)
Eosinophils Absolute: 0.3 10*3/uL (ref 0.0–0.7)
Eosinophils Relative: 5.7 % — ABNORMAL HIGH (ref 0.0–5.0)
HCT: 39.6 % (ref 39.0–52.0)
Hemoglobin: 13.9 g/dL (ref 13.0–17.0)
Lymphocytes Relative: 19 % (ref 12.0–46.0)
Lymphs Abs: 1.1 10*3/uL (ref 0.7–4.0)
MCHC: 35.2 g/dL (ref 30.0–36.0)
MCV: 89.9 fl (ref 78.0–100.0)
Monocytes Absolute: 0.4 10*3/uL (ref 0.1–1.0)
Monocytes Relative: 7.3 % (ref 3.0–12.0)
Neutro Abs: 4 10*3/uL (ref 1.4–7.7)
Neutrophils Relative %: 67.3 % (ref 43.0–77.0)
Platelets: 230 10*3/uL (ref 150.0–400.0)
RBC: 4.4 Mil/uL (ref 4.22–5.81)
RDW: 13.1 % (ref 11.5–15.5)
WBC: 5.9 10*3/uL (ref 4.0–10.5)

## 2018-08-30 LAB — LIPID PANEL
Cholesterol: 135 mg/dL (ref 0–200)
HDL: 30.8 mg/dL — ABNORMAL LOW (ref >39.00)
LDL Cholesterol: 73 mg/dL (ref 0–99)
NonHDL: 104.35
Total CHOL/HDL Ratio: 4
Triglycerides: 156 mg/dL — ABNORMAL HIGH (ref 0.0–149.0)
VLDL: 31.2 mg/dL (ref 0.0–40.0)

## 2018-08-30 LAB — HEMOGLOBIN A1C: Hgb A1c MFr Bld: 5.8 % (ref 4.6–6.5)

## 2018-08-31 ENCOUNTER — Other Ambulatory Visit: Payer: Self-pay | Admitting: Internal Medicine

## 2018-09-01 MED ORDER — HYDROCHLOROTHIAZIDE 12.5 MG PO TABS: 12.5000 mg | ORAL_TABLET | Freq: Every day | ORAL | 1 refills | Status: DC

## 2018-09-01 MED ORDER — LOSARTAN POTASSIUM 100 MG PO TABS: 100.0000 mg | ORAL_TABLET | Freq: Every day | ORAL | 1 refills | Status: DC

## 2018-09-11 ENCOUNTER — Other Ambulatory Visit: Payer: Self-pay | Admitting: Internal Medicine

## 2018-09-21 ENCOUNTER — Other Ambulatory Visit: Payer: Self-pay | Admitting: Internal Medicine

## 2018-09-21 DIAGNOSIS — J302 Other seasonal allergic rhinitis: Secondary | ICD-10-CM

## 2018-10-11 ENCOUNTER — Other Ambulatory Visit: Payer: Self-pay | Admitting: Internal Medicine

## 2018-10-20 ENCOUNTER — Encounter: Payer: Self-pay | Admitting: Internal Medicine

## 2018-10-21 MED ORDER — LOSARTAN POTASSIUM-HCTZ 100-12.5 MG PO TABS: 1.0000 | ORAL_TABLET | Freq: Every day | ORAL | 1 refills | Status: DC

## 2018-11-09 ENCOUNTER — Encounter: Payer: Self-pay | Admitting: Internal Medicine

## 2018-11-10 MED ORDER — TADALAFIL 20 MG PO TABS: 10.0000 mg | ORAL_TABLET | Freq: Every day | ORAL | 3 refills | Status: DC | PRN

## 2018-12-13 ENCOUNTER — Other Ambulatory Visit: Payer: Self-pay | Admitting: Internal Medicine

## 2019-01-06 ENCOUNTER — Other Ambulatory Visit: Payer: Self-pay | Admitting: Internal Medicine

## 2019-02-09 ENCOUNTER — Other Ambulatory Visit: Payer: Self-pay | Admitting: Internal Medicine

## 2019-02-12 ENCOUNTER — Other Ambulatory Visit: Payer: Self-pay | Admitting: Internal Medicine

## 2019-03-19 ENCOUNTER — Other Ambulatory Visit: Payer: Self-pay | Admitting: Internal Medicine

## 2019-04-07 ENCOUNTER — Other Ambulatory Visit: Payer: Self-pay | Admitting: Internal Medicine

## 2019-04-22 ENCOUNTER — Other Ambulatory Visit: Payer: Self-pay | Admitting: Internal Medicine

## 2019-04-22 NOTE — Telephone Encounter (Signed)
Last OV 08/17/18 Last refill(s) 03/21/19 Next OV not scheduled

## 2019-04-24 ENCOUNTER — Other Ambulatory Visit: Payer: Self-pay | Admitting: Internal Medicine

## 2019-05-14 ENCOUNTER — Other Ambulatory Visit: Payer: Self-pay | Admitting: Internal Medicine

## 2019-05-31 ENCOUNTER — Other Ambulatory Visit: Payer: Self-pay | Admitting: Internal Medicine

## 2019-06-02 ENCOUNTER — Encounter: Payer: Self-pay | Admitting: Internal Medicine

## 2019-06-02 ENCOUNTER — Other Ambulatory Visit: Payer: Self-pay

## 2019-06-02 MED ORDER — LOSARTAN POTASSIUM-HCTZ 100-12.5 MG PO TABS: 1.0000 | ORAL_TABLET | Freq: Every day | ORAL | 0 refills | Status: DC

## 2019-06-15 ENCOUNTER — Other Ambulatory Visit: Payer: Self-pay | Admitting: Internal Medicine

## 2019-06-17 ENCOUNTER — Telehealth: Payer: Self-pay | Admitting: Internal Medicine

## 2019-06-17 MED ORDER — ETODOLAC 500 MG PO TABS: 500.0000 mg | ORAL_TABLET | Freq: Two times a day (BID) | ORAL | 0 refills | Status: DC | PRN

## 2019-06-17 NOTE — Telephone Encounter (Signed)
15 day supply sent.  

## 2019-06-17 NOTE — Telephone Encounter (Signed)
Medication: etodolac (LODINE) 500 MG tablet  Patient has an appt. For 03/16 for med follow up. He is out of meds and wants to know if he can get a refill to hold him over   Has the patient contacted their pharmacy? Yes.   (If no, request that the patient contact the pharmacy for the refill.) (If yes, when and what did the pharmacy advise?)  Appt. Needed   Preferred Pharmacy (with phone number or street name): [id number] - [place], [place]  [id number], [place] [address]  Phone:  [phone] Fax:  [phone]  Agent: Please be advised that RX refills may take up to 3 business days. We ask that you follow-up with your pharmacy.

## 2019-06-19 ENCOUNTER — Other Ambulatory Visit: Payer: Self-pay | Admitting: Internal Medicine

## 2019-06-23 ENCOUNTER — Telehealth: Payer: Self-pay | Admitting: *Deleted

## 2019-06-23 MED ORDER — ONETOUCH DELICA LANCETS 33G MISC: 1 refills | Status: DC

## 2019-06-23 MED ORDER — ONETOUCH VERIO FLEX SYSTEM W/DEVICE KIT: PACK | 0 refills | Status: DC

## 2019-06-23 MED ORDER — ONETOUCH VERIO VI STRP: ORAL_STRIP | 1 refills | Status: DC

## 2019-06-23 NOTE — Telephone Encounter (Signed)
Patient needs new rx for meter and test strips.  I will also send in lancets as well. rxs sent in.

## 2019-06-27 ENCOUNTER — Other Ambulatory Visit: Payer: Self-pay | Admitting: Internal Medicine

## 2019-06-27 NOTE — Telephone Encounter (Signed)
Appt tomorrow- will refill at that time.

## 2019-06-28 ENCOUNTER — Ambulatory Visit (INDEPENDENT_AMBULATORY_CARE_PROVIDER_SITE_OTHER): Payer: 59 | Admitting: Internal Medicine

## 2019-06-28 ENCOUNTER — Encounter: Payer: Self-pay | Admitting: Internal Medicine

## 2019-06-28 ENCOUNTER — Other Ambulatory Visit: Payer: Self-pay

## 2019-06-28 VITALS — BP 134/85 | Ht 76.0 in

## 2019-06-28 DIAGNOSIS — E785 Hyperlipidemia, unspecified: Secondary | ICD-10-CM | POA: Diagnosis not present

## 2019-06-28 DIAGNOSIS — E118 Type 2 diabetes mellitus with unspecified complications: Secondary | ICD-10-CM

## 2019-06-28 DIAGNOSIS — I10 Essential (primary) hypertension: Secondary | ICD-10-CM

## 2019-06-28 MED ORDER — ETODOLAC 500 MG PO TABS: 500.0000 mg | ORAL_TABLET | Freq: Two times a day (BID) | ORAL | 0 refills | Status: DC | PRN

## 2019-06-28 MED ORDER — TADALAFIL 20 MG PO TABS: 10.0000 mg | ORAL_TABLET | Freq: Every day | ORAL | 0 refills | Status: DC | PRN

## 2019-06-28 MED ORDER — LOSARTAN POTASSIUM-HCTZ 100-12.5 MG PO TABS: 1.0000 | ORAL_TABLET | Freq: Every day | ORAL | 0 refills | Status: DC

## 2019-06-28 NOTE — Progress Notes (Signed)
 Subjective:    Patient ID: [name], male    DOB: 02/13/1966, 53 y.o.   MRN: [id number]  DOS:  06/28/2019 Type of visit - description: Virtual Visit via Video Note  I connected with the above patient  by a video enabled telemedicine application and verified that I am speaking with the correct person using two identifiers.   THIS ENCOUNTER IS A VIRTUAL VISIT DUE TO COVID-19 - PATIENT WAS NOT SEEN IN THE OFFICE. PATIENT HAS CONSENTED TO VIRTUAL VISIT / TELEMEDICINE VISIT   Location of patient: home  Location of provider: office  I discussed the limitations of evaluation and management by telemedicine and the availability of in person appointments. The patient expressed understanding and agreed to proceed.   Follow-up His main concern is refills for diabetes, HTN and pain medication. In general he is feeling well. We talk about his ambulatory BPs CBGs.    Review of Systems Denies chest pain or difficulty breathing No nausea, vomiting, diarrhea No blood in the stools or abdominal pain  Past Medical History:  Diagnosis Date  . Diabetes mellitus   . Hyperlipemia   . Hypertension   . Obesity   . OSA (obstructive sleep apnea)    on CPAP    Past Surgical History:  Procedure Laterality Date  . NO PAST SURGERIES      Allergies as of 06/28/2019      Reactions   Penicillins    REACTION: unspecified      Medication List       Accurate as of June 28, 2019  4:43 PM. If you have any questions, ask your nurse or doctor.        aspirin 81 MG tablet Take 81 mg by mouth daily.   cromolyn 4 % ophthalmic solution Commonly known as: OPTICROM Place 1 drop into both eyes 2 (two) times daily.   cyclobenzaprine 10 MG tablet Commonly known as: FLEXERIL Take 1 tablet (10 mg total) by mouth 2 (two) times daily as needed for muscle spasms.   doxycycline 100 MG capsule Commonly known as: VIBRAMYCIN Take 1 capsule (100 mg total) by mouth 2 (two) times daily.   etodolac 500  MG tablet Commonly known as: LODINE Take 1 tablet (500 mg total) by mouth 2 (two) times daily as needed.   fluticasone 50 MCG/ACT nasal spray Commonly known as: FLONASE Place 2 sprays into both nostrils daily.   losartan-hydrochlorothiazide 100-12.5 MG tablet Commonly known as: HYZAAR Take 1 tablet by mouth daily.   metFORMIN 1000 MG tablet Commonly known as: GLUCOPHAGE Take 1 tablet (1,000 mg total) by mouth 2 (two) times daily with a meal.   OneTouch Delica Lancets 33G Misc Test blood sugar twice a day.  Dx code: E11.9   OneTouch Verio Flex System w/Device Kit Test blood sugar twice a day.  Dx code: E11.9   OneTouch Verio test strip Generic drug: glucose blood Test blood sugar twice a day.  Dx code: E11.9   pravastatin 40 MG tablet Commonly known as: PRAVACHOL Take 1 tablet (40 mg total) by mouth daily.   tadalafil 20 MG tablet Commonly known as: CIALIS Take 0.5-1 tablets (10-20 mg total) by mouth daily as needed for erectile dysfunction.          Objective:   Physical Exam Ht 6' 4" (1.93 m)   BMI 38.01 kg/m  This is a virtual video visit.  He is alert oriented x3, in no distress. His most recent BP is 134/85.      Assessment     Assessment DM HTN Hyperlipidemia DJD - knees  Morbid obesity Neck pain: chronic,see chiropractor on-off OSA, + CPAP Diastases recti   PLAN: DM: Reports good compliance with Metformin, CBGs in the 140s, 150s.  Last A1c 5.8.  RF x1 month, check A1c. HTN: Continue losartan HCTZ.  Reports BPs around 134/85.  Check a BMP.  RF x1 month High cholesterol: RF Pravachol. Preventive care: Had his first COVID-19 shot already Compliance with follow-up: His main concern today is get his medications refilled.  States he can get blood work but in 2 to 3 weeks. We will send 1 month supply of each medication, further refills only if he has blood work. RTC for labs within 2 to 3 weeks RTC CPX 3 months     I discussed the assessment and  treatment plan with the patient. The patient was provided an opportunity to ask questions and all were answered. The patient agreed with the plan and demonstrated an understanding of the instructions.   The patient was advised to call back or seek an in-person evaluation if the symptoms worsen or if the condition fails to improve as anticipated.

## 2019-06-28 NOTE — Progress Notes (Signed)
Pre visit review using our clinic review tool, if applicable. No additional management support is needed unless otherwise documented below in the visit note. 

## 2019-06-29 NOTE — Assessment & Plan Note (Signed)
DM: Reports good compliance with Metformin, CBGs in the 140s, 150s.  Last A1c 5.8.  RF x1 month, check A1c. HTN: Continue losartan HCTZ.  Reports BPs around 134/85.  Check a BMP.  RF x1 month High cholesterol: RF Pravachol. Preventive care: Had his first COVID-19 shot already Compliance with follow-up: His main concern today is get his medications refilled.  States he can get blood work but in 2 to 3 weeks. We will send 1 month supply of each medication, further refills only if he has blood work. RTC for labs within 2 to 3 weeks RTC CPX 3 months

## 2019-06-30 ENCOUNTER — Telehealth: Payer: Self-pay | Admitting: Internal Medicine

## 2019-06-30 NOTE — Telephone Encounter (Signed)
Called pt left msg for him to callback for appt  In 3 weeks lab and 3 months cpe

## 2019-06-30 NOTE — Telephone Encounter (Signed)
-----   Message from Colon Branch, MD sent at 06/28/2019  4:48 PM EDT ----- Regarding: Labs, refills K:Send 1 month supply no refills for  BP, DM medicines tadalafil. Enter orders for BMP, A1c: HTN, DMNo further refills without blood workS: Schedule a lab appointment at most 3 weeks from todaySchedule physical in person in 3 months

## 2019-07-05 ENCOUNTER — Telehealth: Payer: Self-pay | Admitting: Internal Medicine

## 2019-07-05 NOTE — Telephone Encounter (Signed)
-----   Message from Colon Branch, MD sent at 06/28/2019  4:48 PM EDT ----- Regarding: Labs, refills K:Send 1 month supply no refills for  BP, DM medicines tadalafil. Enter orders for BMP, A1c: HTN, DMNo further refills without blood workS: Schedule a lab appointment at most 3 weeks from todaySchedule physical in person in 3 months

## 2019-07-05 NOTE — Telephone Encounter (Signed)
Called Pt left  2nd message to call back

## 2019-07-22 ENCOUNTER — Encounter: Payer: Self-pay | Admitting: Internal Medicine

## 2019-07-30 ENCOUNTER — Other Ambulatory Visit: Payer: Self-pay | Admitting: Internal Medicine

## 2019-07-31 ENCOUNTER — Other Ambulatory Visit: Payer: Self-pay | Admitting: Internal Medicine

## 2019-08-08 ENCOUNTER — Other Ambulatory Visit: Payer: Self-pay

## 2019-08-08 ENCOUNTER — Other Ambulatory Visit (INDEPENDENT_AMBULATORY_CARE_PROVIDER_SITE_OTHER): Payer: 59

## 2019-08-08 DIAGNOSIS — E118 Type 2 diabetes mellitus with unspecified complications: Secondary | ICD-10-CM | POA: Diagnosis not present

## 2019-08-08 DIAGNOSIS — I10 Essential (primary) hypertension: Secondary | ICD-10-CM | POA: Diagnosis not present

## 2019-08-08 LAB — BASIC METABOLIC PANEL
BUN: 17 mg/dL (ref 6–23)
CO2: 28 mEq/L (ref 19–32)
Calcium: 9.3 mg/dL (ref 8.4–10.5)
Chloride: 99 mEq/L (ref 96–112)
Creatinine, Ser: 1.06 mg/dL (ref 0.40–1.50)
GFR: 72.25 mL/min (ref >60.00)
Glucose, Bld: 140 mg/dL — ABNORMAL HIGH (ref 70–99)
Potassium: 4.1 mEq/L (ref 3.5–5.1)
Sodium: 135 mEq/L (ref 135–145)

## 2019-08-08 LAB — HEMOGLOBIN A1C: Hgb A1c MFr Bld: 7.2 % — ABNORMAL HIGH (ref 4.6–6.5)

## 2019-08-16 ENCOUNTER — Other Ambulatory Visit: Payer: Self-pay | Admitting: Internal Medicine

## 2019-08-24 ENCOUNTER — Other Ambulatory Visit: Payer: Self-pay | Admitting: Internal Medicine

## 2019-08-29 ENCOUNTER — Other Ambulatory Visit: Payer: Self-pay | Admitting: Internal Medicine

## 2019-09-29 ENCOUNTER — Other Ambulatory Visit: Payer: Self-pay | Admitting: Internal Medicine

## 2019-10-11 ENCOUNTER — Encounter (HOSPITAL_COMMUNITY): Payer: Self-pay

## 2019-10-11 ENCOUNTER — Ambulatory Visit (HOSPITAL_COMMUNITY)
Admission: EM | Admit: 2019-10-11 | Discharge: 2019-10-11 | Disposition: A | Discharge status: Home / Self Care | Payer: 59 | Attending: Family Medicine | Admitting: Family Medicine

## 2019-10-11 ENCOUNTER — Other Ambulatory Visit: Payer: Self-pay

## 2019-10-11 DIAGNOSIS — M25561 Pain in right knee: Secondary | ICD-10-CM

## 2019-10-11 MED ORDER — PREDNISONE 10 MG (21) PO TBPK
ORAL_TABLET | Freq: Every day | ORAL | 0 refills | Status: DC
Start: 2019-10-11 — End: 2020-02-10

## 2019-10-11 NOTE — ED Provider Notes (Signed)
[facility]   [id number] 10/11/19 Arrival Time: 1301  ASSESSMENT & PLAN:  1. Acute pain of right knee     No indication for knee imaging at this time.  Begin trial of: Meds ordered this encounter  Medications   predniSONE (STERAPRED UNI-PAK 21 TAB) 10 MG (21) TBPK tablet    Sig: Take by mouth daily. Take as directed.    Dispense:  21 tablet    Refill:  0   WBAT. Orders Placed This Encounter  Procedures   Apply knee brace/sleeve   Work note provided. Recommend:  Follow-up Information    [facility].   Why: If worsening or failing to improve as anticipated. Contact information: [address] [place] [phone]               Reviewed expectations re: course of current medical issues. Questions answered. Outlined signs and symptoms indicating need for more acute intervention. Patient verbalized understanding. After Visit Summary given.  SUBJECTIVE: History from: patient. [name] is a 54 y.o. male who reports fairly persistent moderate pain of his right knee; described as aching; without radiation. Onset: gradual. First noted: approx 4 d ago. Injury/trama: no, but noticed after working; climbs a lot at work; changes tires on trucks. Symptoms have progressed to a point and plateaued since beginning. Aggravating factors: certain movements and prolonged walking/standing. Alleviating factors: have not been identified. Associated symptoms: none reported. Extremity sensation changes or weakness: none. Self treatment: NSAID without much relief History of similar: yes, distant past; "took a medicine for inflammation"; helped.  Past Surgical History:  Procedure Laterality Date   NO PAST SURGERIES        OBJECTIVE:  Vitals:   10/11/19 1353  BP: 135/78  Pulse: 91  Resp: 18  Temp: 98 F (36.7 C)  TempSrc: Oral  SpO2: 97%    General appearance: alert; no distress HEENT: [place];  AT Neck: supple with FROM Resp: unlabored respirations Extremities:  RLE: warm with well perfused appearance; poorly localized mild to moderate tenderness over right knee; without gross deformities; swelling: moderate and more superiorly; bruising: none; knee ROM: normal Skin: warm and dry; no visible rashes Neurologic: gait normal; normal sensation and strength of RLE Psychological: alert and cooperative; normal mood and affect    Allergies  Allergen Reactions   Penicillins     REACTION: unspecified    Past Medical History:  Diagnosis Date   Diabetes mellitus    Hyperlipemia    Hypertension    Obesity    OSA (obstructive sleep apnea)    on CPAP   Social History   Socioeconomic History   Marital status: Married    Spouse name: [name]   Number of children: 2   Years of education: Not on file   Highest education level: Not on file  Occupational History   Occupation: Therapist, 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[employer]   Occupation: Music therapist: [employer]  Tobacco Use   Smoking status: Never Smoker   Smokeless tobacco: Never Used  Substance and Sexual Activity   Alcohol use: No   Drug use: No   Sexual activity: Yes  Other Topics Concern   Not on file  Social History Narrative   Remarried for the 3rd time w/ a lady for the [place]   Children:  2010 , 2012            Social Determinants of  Health   Financial Resource Strain:    Difficulty of Paying Living Expenses:   Food Insecurity:    Worried About Charity fundraiser in the Last Year:    Arboriculturist in the Last Year:   Transportation Needs:    Film/video editor (Medical):    Lack of Transportation (Non-Medical):   Physical Activity:    Days of Exercise per Week:    Minutes of Exercise per Session:   Stress:    Feeling of Stress :   Social Connections:    Frequency of Communication with Friends and Family:    Frequency of Social Gatherings with  Friends and Family:    Attends Religious Services:    Active Member of Clubs or Organizations:    Attends Music therapist:    Marital Status:    Family History  Problem Relation Age of Onset   Diabetes Mother    Hypertension Neg Hx    Coronary artery disease Neg Hx    Colon cancer Neg Hx    Prostate cancer Neg Hx    Past Surgical History:  Procedure Laterality Date   NO PAST SURGERIES        Vanessa Kick, MD 10/11/19 1419

## 2019-10-11 NOTE — ED Triage Notes (Signed)
Pt presents with right knee pain X 4 days not injury related.

## 2019-10-11 NOTE — Discharge Instructions (Addendum)
Please be aware, prednisone will increase your blood sugars while taking.

## 2019-10-25 ENCOUNTER — Encounter: Payer: Self-pay | Admitting: Internal Medicine

## 2019-10-28 ENCOUNTER — Encounter: Payer: Self-pay | Admitting: Orthopaedic Surgery

## 2019-10-28 ENCOUNTER — Ambulatory Visit: Payer: 59 | Admitting: Orthopaedic Surgery

## 2019-10-28 ENCOUNTER — Other Ambulatory Visit: Payer: Self-pay

## 2019-10-28 ENCOUNTER — Telehealth: Payer: Self-pay

## 2019-10-28 VITALS — Ht 73.5 in | Wt 315.0 lb

## 2019-10-28 DIAGNOSIS — Z6841 Body Mass Index (BMI) 40.0 and over, adult: Secondary | ICD-10-CM | POA: Diagnosis not present

## 2019-10-28 DIAGNOSIS — M1711 Unilateral primary osteoarthritis, right knee: Secondary | ICD-10-CM | POA: Diagnosis not present

## 2019-10-28 MED ORDER — TRAMADOL HCL 50 MG PO TABS: 100.0000 mg | ORAL_TABLET | Freq: Two times a day (BID) | ORAL | 1 refills | Status: DC | PRN

## 2019-10-28 NOTE — Progress Notes (Addendum)
Office Visit Note   Patient: [name]           Date of Birth: Aug 30, 1965           MRN: [id number] Visit Date: 10/28/2019              Requested by: Colon Branch, [address] [place],  [place] [address] PCP: Colon Branch, MD   Assessment & Plan: Visit Diagnoses:  1. Class 3 severe obesity due to excess calories without serious comorbidity with body mass index (BMI) of 40.0 to 44.9 in adult [facility])   2. Unilateral primary osteoarthritis, right knee     Plan: Advanced osteoarthritis right knee with BMI of 41.  Long discussion over 45 minutes regarding all of the above.  [name] would need to lose at least 30 pounds from his present weight of 315 before we would consider knee replacement.  We have discussed appropriate medicines.  He is already on etodolac.  He can supplement this with CBD oil or Voltaren gel.  We will precertify viscosupplementation and consider an unloading brace at [facility].  Tramadol for pain. This patient is diagnosed with osteoarthritis of the knee(s).    Radiographs show evidence of joint space narrowing, osteophytes, subchondral sclerosis and/or subchondral cysts.  This patient has knee pain which interferes with functional and activities of daily living.    This patient has experienced inadequate response, adverse effects and/or intolerance with conservative treatments such as acetaminophen, NSAIDS, topical creams, physical therapy or regular exercise, knee bracing and/or weight loss.   This patient has experienced inadequate response or has a contraindication to intra articular steroid injections for at least 3 months.   This patient is not scheduled to have a total knee replacement within 6 months of starting treatment with viscosupplementation.   Follow-Up Instructions: Return Precertify viscosupplementation.   Orders:  No orders of the defined types were placed in this encounter.  No orders of the defined types were placed in this  encounter.     Procedures: No procedures performed   Clinical Data: No additional findings.   Subjective: Chief Complaint  Patient presents with  . Right Knee - Pain  Chronic history of right knee pain prompting an urgent care visit and a follow-up at [facility].  Has had oral prednisone and a local cortisone injection without much relief.  Has etodolac per his primary care physician but still having significant compromise of his work-related activity.  Pain is localized to the knee. I reviewed the films of his right knee in the standing projection from [employer].  He has significant tricompartmental degenerative changes in the right knee with about 3 to 4 degrees of varus and, narrowing of the medial joint space, peripheral osteophytes and subchondral sclerosis.  No acute change or ectopic calcification  HPI  Review of Systems   Objective: Vital Signs: Ht 6' 1.5" (1.867 m)   Wt (!) 315 lb (142.9 kg)   BMI 41.00 kg/m   Physical Exam Constitutional:      Appearance: He is well-developed.  Eyes:     Pupils: Pupils are equal, round, and reactive to light.  Pulmonary:     Effort: Pulmonary effort is normal.  Skin:    General: Skin is warm and dry.  Neurological:     Mental Status: He is alert and oriented to person, place, and time.  Psychiatric:        Behavior: Behavior normal.  Ortho Exam BMI 41.  Awake alert and oriented x3.  Has local tenderness along the medial aspect of his right knee.  Full extension and flexion over 100 degrees without instability.  Very minimal patella crepitation and no pain with patella compression.  Small effusion.  No calf pain.  No popliteal pain or mass  Specialty Comments:  No specialty comments available.  Imaging: No results found.   PMFS History: Patient Active Problem List   Diagnosis Date Noted  . Unilateral primary osteoarthritis, right knee 10/28/2019  . PCP NOTES >>>>>>>> 07/09/2015  . DJD (degenerative joint  disease) 10/27/2014  . Screening for heart disease 06/28/2012  . OSA (obstructive sleep apnea) 06/14/2012  . ED (erectile dysfunction) 04/26/2012  . Annual physical exam 12/29/2011  . Achilles tendinitis 09/10/2010  . Allergic rhinitis 07/12/2010  . Hyperlipidemia 04/26/2007  . DM II (diabetes mellitus, type II), controlled ([place]) 04/22/2006  . OBESITY NOS 04/22/2006  . Essential hypertension 04/22/2006   Past Medical History:  Diagnosis Date  . Diabetes mellitus   . Hyperlipemia   . Hypertension   . Obesity   . OSA (obstructive sleep apnea)    on CPAP    Family History  Problem Relation Age of Onset  . Diabetes Mother   . Hypertension Neg Hx   . Coronary artery disease Neg Hx   . Colon cancer Neg Hx   . Prostate cancer Neg Hx     Past Surgical History:  Procedure Laterality Date  . NO PAST SURGERIES     Social History   Occupational History  . 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 Smoking status: Never Smoker  . Smokeless tobacco: Never Used  Substance and Sexual Activity  . Alcohol use: No  . Drug use: No  . Sexual activity: Yes     Garald Balding, MD   Note - This record has been created using [employer].  Chart creation errors have been sought, but may not always  have been located. Such creation errors do not reflect on  the standard of medical care.

## 2019-10-28 NOTE — Telephone Encounter (Signed)
Please get authorization for Right knee gel injection-Dr. Durward Fortes pt.

## 2019-10-31 NOTE — Telephone Encounter (Signed)
Noted  

## 2019-11-02 ENCOUNTER — Telehealth: Payer: Self-pay

## 2019-11-02 NOTE — Telephone Encounter (Signed)
Submitted VOB, Euflexxa series, right knee.

## 2019-11-07 ENCOUNTER — Other Ambulatory Visit: Payer: Self-pay | Admitting: Internal Medicine

## 2019-11-20 ENCOUNTER — Other Ambulatory Visit: Payer: Self-pay | Admitting: Internal Medicine

## 2019-11-24 ENCOUNTER — Other Ambulatory Visit: Payer: Self-pay | Admitting: Internal Medicine

## 2019-11-25 ENCOUNTER — Other Ambulatory Visit: Payer: Self-pay | Admitting: Internal Medicine

## 2019-11-25 ENCOUNTER — Telehealth: Payer: Self-pay

## 2019-11-25 NOTE — Telephone Encounter (Signed)
Called and left a VM advising patient to call back to schedule an appt.for gel injection with Dr. Durward Fortes or Aaron Edelman.  Approved, Euflexxa series, right knee. [place] Patient will be responsible for 20% OOP. Co-pay of $50.00 No PA required

## 2019-12-14 ENCOUNTER — Other Ambulatory Visit: Payer: Self-pay

## 2019-12-14 ENCOUNTER — Ambulatory Visit: Payer: 59 | Admitting: Orthopaedic Surgery

## 2019-12-14 ENCOUNTER — Encounter: Payer: Self-pay | Admitting: Orthopaedic Surgery

## 2019-12-14 VITALS — Ht 73.5 in | Wt 315.0 lb

## 2019-12-14 DIAGNOSIS — M1711 Unilateral primary osteoarthritis, right knee: Secondary | ICD-10-CM

## 2019-12-14 MED ORDER — SODIUM HYALURONATE (VISCOSUP) 20 MG/2ML IX SOSY
20.0000 mg | PREFILLED_SYRINGE | INTRA_ARTICULAR | Status: AC | PRN
  Administered 2019-12-14: 20 mg via INTRA_ARTICULAR

## 2019-12-14 NOTE — Progress Notes (Signed)
Office Visit Note   Patient: [name]           Date of Birth: 03-28-1966           MRN: [id number] Visit Date: 12/14/2019              Requested by: Colon Branch, [address] [place],  [place] [address] PCP: Colon Branch, MD   Assessment & Plan: Visit Diagnoses:  1. Unilateral primary osteoarthritis, right knee     Plan: First Euflexxa injection right knee return weekly for the next 2 weeks to complete the series  Follow-Up Instructions: Return in about 1 week (around 12/21/2019).   Orders:  Orders Placed This Encounter  Procedures  . Large Joint Inj: R knee   No orders of the defined types were placed in this encounter.     Procedures: Large Joint Inj: R knee on 12/14/2019 3:55 PM Indications: pain and joint swelling Details: 25 G 1.5 in needle  Arthrogram: No  Medications: 20 mg Sodium Hyaluronate 20 MG/2ML Outcome: tolerated well, no immediate complications Procedure, treatment alternatives, risks and benefits explained, specific risks discussed. Consent was given by the patient. Immediately prior to procedure a time out was called to verify the correct patient, procedure, equipment, support staff and site/side marked as required. Patient was prepped and draped in the usual sterile fashion.       Clinical Data: No additional findings.   Subjective: Chief Complaint  Patient presents with  . Right Knee - Follow-up    Euflexxa injections started 12/14/2019  Patient presents today for the first Euflexxa injection in his right knee.   HPI  Review of Systems   Objective: Vital Signs: Ht 6' 1.5" (1.867 m)   Wt (!) 315 lb (142.9 kg)   BMI 41.00 kg/m   Physical Exam  Ortho Exam right knee was not hot red warm or swollen.  There was a little area of redness along the prepatellar region but no particular pain.  I made sure that I injected well medial to any area of redness  Specialty Comments:  No specialty comments  available.  Imaging: No results found.   PMFS History: Patient Active Problem List   Diagnosis Date Noted  . Unilateral primary osteoarthritis, right knee 10/28/2019  . PCP NOTES >>>>>>>> 07/09/2015  . DJD (degenerative joint disease) 10/27/2014  . Screening for heart disease 06/28/2012  . OSA (obstructive sleep apnea) 06/14/2012  . ED (erectile dysfunction) 04/26/2012  . Annual physical exam 12/29/2011  . Achilles tendinitis 09/10/2010  . Allergic rhinitis 07/12/2010  . Hyperlipidemia 04/26/2007  . DM II (diabetes mellitus, type II), controlled ([place]) 04/22/2006  . OBESITY NOS 04/22/2006  . Essential hypertension 04/22/2006   Past Medical History:  Diagnosis Date  . Diabetes mellitus   . Hyperlipemia   . Hypertension   . Obesity   . OSA (obstructive sleep apnea)    on CPAP    Family History  Problem Relation Age of Onset  . Diabetes Mother   . Hypertension Neg Hx   . Coronary artery disease Neg Hx   . Colon cancer Neg Hx   . Prostate cancer Neg Hx     Past Surgical History:  Procedure Laterality Date  . NO PAST SURGERIES     Social History   Occupational History  . 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 Smoking status: Never Smoker  . Smokeless tobacco: Never Used  Substance and Sexual Activity  . Alcohol use: No  . Drug use: No  . Sexual activity: Yes

## 2019-12-27 ENCOUNTER — Encounter: Payer: Self-pay | Admitting: Orthopaedic Surgery

## 2019-12-27 ENCOUNTER — Other Ambulatory Visit: Payer: Self-pay

## 2019-12-27 ENCOUNTER — Ambulatory Visit: Payer: 59 | Admitting: Orthopaedic Surgery

## 2019-12-27 VITALS — Ht 73.5 in | Wt 315.0 lb

## 2019-12-27 DIAGNOSIS — Z6841 Body Mass Index (BMI) 40.0 and over, adult: Secondary | ICD-10-CM

## 2019-12-27 DIAGNOSIS — E66813 Obesity, class 3: Secondary | ICD-10-CM

## 2019-12-27 DIAGNOSIS — M1711 Unilateral primary osteoarthritis, right knee: Secondary | ICD-10-CM

## 2019-12-27 MED ORDER — LIDOCAINE HCL 1 % IJ SOLN
2.0000 mL | INTRAMUSCULAR | Status: AC | PRN
  Administered 2019-12-27: 2 mL

## 2019-12-27 MED ORDER — SODIUM HYALURONATE (VISCOSUP) 20 MG/2ML IX SOSY
20.0000 mg | PREFILLED_SYRINGE | INTRA_ARTICULAR | Status: AC | PRN
  Administered 2019-12-27: 20 mg via INTRA_ARTICULAR

## 2019-12-27 NOTE — Progress Notes (Signed)
Office Visit Note   Patient: [name]           Date of Birth: January 19, 1966           MRN: [id number] Visit Date: 12/27/2019              Requested by: Colon Branch, [address] [place],  [place] [address] PCP: Colon Branch, MD   Assessment & Plan: Visit Diagnoses:  1. Class 3 severe obesity due to excess calories without serious comorbidity with body mass index (BMI) of 40.0 to 44.9 in adult [facility])   2. Unilateral primary osteoarthritis, right knee     Plan: Second Euflexxa injection performed right knee without difficulty.  Return in 1 week to complete the series of 3  Follow-Up Instructions: Return in about 1 week (around 01/03/2020).   Orders:  Orders Placed This Encounter  Procedures  . Large Joint Inj: R knee   No orders of the defined types were placed in this encounter.     Procedures: Large Joint Inj: R knee on 12/27/2019 3:22 PM Indications: pain and joint swelling Details: 25 G 1.5 in needle  Arthrogram: No  Medications: 2 mL lidocaine 1 %; 20 mg Sodium Hyaluronate 20 MG/2ML Outcome: tolerated well, no immediate complications Procedure, treatment alternatives, risks and benefits explained, specific risks discussed. Consent was given by the patient. Immediately prior to procedure a time out was called to verify the correct patient, procedure, equipment, support staff and site/side marked as required. Patient was prepped and draped in the usual sterile fashion.       Clinical Data: No additional findings.   Subjective: Chief Complaint  Patient presents with  . Right Knee - Follow-up    Euflexxa started 12/14/2019  Patient presents today for the second Euflexxa injection in his right knee. He started the series on 12/14/2019. No complications from the first vaccine. He wishes to proceed today.   HPI  Review of Systems   Objective: Vital Signs: Ht 6' 1.5" (1.867 m)   Wt (!) 315 lb (142.9 kg)   BMI 41.00 kg/m   Physical  Exam  Ortho Exam right knee was not hot red warm or swollen.  Skin intact  Specialty Comments:  No specialty comments available.  Imaging: No results found.   PMFS History: Patient Active Problem List   Diagnosis Date Noted  . Unilateral primary osteoarthritis, right knee 10/28/2019  . PCP NOTES >>>>>>>> 07/09/2015  . DJD (degenerative joint disease) 10/27/2014  . Screening for heart disease 06/28/2012  . OSA (obstructive sleep apnea) 06/14/2012  . ED (erectile dysfunction) 04/26/2012  . Annual physical exam 12/29/2011  . Achilles tendinitis 09/10/2010  . Allergic rhinitis 07/12/2010  . Hyperlipidemia 04/26/2007  . DM II (diabetes mellitus, type II), controlled ([place]) 04/22/2006  . OBESITY NOS 04/22/2006  . Essential hypertension 04/22/2006   Past Medical History:  Diagnosis Date  . Diabetes mellitus   . Hyperlipemia   . Hypertension   . Obesity   . OSA (obstructive sleep apnea)    on CPAP    Family History  Problem Relation Age of Onset  . Diabetes Mother   . Hypertension Neg Hx   . Coronary artery disease Neg Hx   . Colon cancer Neg Hx   . Prostate cancer Neg Hx     Past Surgical History:  Procedure Laterality Date  . NO PAST SURGERIES     Social History   Occupational History  .  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 Smoking status: Never Smoker  . Smokeless tobacco: Never Used  Substance and Sexual Activity  . Alcohol use: No  . Drug use: No  . Sexual activity: Yes

## 2019-12-28 ENCOUNTER — Encounter: Payer: Self-pay | Admitting: Internal Medicine

## 2020-01-11 ENCOUNTER — Encounter: Payer: Self-pay | Admitting: Internal Medicine

## 2020-01-11 ENCOUNTER — Ambulatory Visit (INDEPENDENT_AMBULATORY_CARE_PROVIDER_SITE_OTHER): Payer: 59 | Admitting: Orthopaedic Surgery

## 2020-01-11 ENCOUNTER — Other Ambulatory Visit: Payer: Self-pay

## 2020-01-11 ENCOUNTER — Encounter: Payer: Self-pay | Admitting: Orthopaedic Surgery

## 2020-01-11 VITALS — Ht 73.5 in | Wt 315.0 lb

## 2020-01-11 DIAGNOSIS — M1711 Unilateral primary osteoarthritis, right knee: Secondary | ICD-10-CM

## 2020-01-11 MED ORDER — SODIUM HYALURONATE (VISCOSUP) 20 MG/2ML IX SOSY
20.0000 mg | PREFILLED_SYRINGE | INTRA_ARTICULAR | Status: AC | PRN
  Administered 2020-01-11: 20 mg via INTRA_ARTICULAR

## 2020-01-11 NOTE — Progress Notes (Signed)
Office Visit Note   Patient: [name]           Date of Birth: 1965-05-06           MRN: [id number] Visit Date: 01/11/2020              Requested by: Colon Branch, [address] [place],  [place] [address] PCP: Colon Branch, MD   Assessment & Plan: Visit Diagnoses:  1. Unilateral primary osteoarthritis, right knee     Plan: Third and final Euflexxa injection right knee.  Mr. Fish relates that the prior injections have made a big difference. He had inquired about gabapentin and I suggested he check with his primary care physician.  Follow-Up Instructions: Return if symptoms worsen or fail to improve.   Orders:  Orders Placed This Encounter  Procedures  . Large Joint Inj: R knee   No orders of the defined types were placed in this encounter.     Procedures: Large Joint Inj: R knee on 01/11/2020 3:16 PM Indications: pain and joint swelling Details: 25 G 1.5 in needle  Arthrogram: No  Medications: 20 mg Sodium Hyaluronate 20 MG/2ML Outcome: tolerated well, no immediate complications Procedure, treatment alternatives, risks and benefits explained, specific risks discussed. Consent was given by the patient. Immediately prior to procedure a time out was called to verify the correct patient, procedure, equipment, support staff and site/side marked as required. Patient was prepped and draped in the usual sterile fashion.       Clinical Data: No additional findings.   Subjective: Chief Complaint  Patient presents with  . Right Knee - Follow-up    Euflexxa started 12/14/2019  Patient presents today for the third Euflexxa injection in his right knee. He started the series on 12/14/2019. He is doing well and noticed improvement. He wishes to proceed on with the final injection today. He is wanting Dr.Norita Meigs to prescribe Gabapentin.  HPI  Review of Systems   Objective: Vital Signs: Ht 6' 1.5" (1.867 m)   Wt (!) 315 lb (142.9 kg)   BMI 41.00 kg/m     Physical Exam  Ortho Exam right knee did not appear to be effused.  Full extension and about 100 degrees of flexion without instability.  Did not have any significant medial or lateral joint pain Specialty Comments:  No specialty comments available.  Imaging: No results found.   PMFS History: Patient Active Problem List   Diagnosis Date Noted  . Unilateral primary osteoarthritis, right knee 10/28/2019  . PCP NOTES >>>>>>>> 07/09/2015  . DJD (degenerative joint disease) 10/27/2014  . Screening for heart disease 06/28/2012  . OSA (obstructive sleep apnea) 06/14/2012  . ED (erectile dysfunction) 04/26/2012  . Annual physical exam 12/29/2011  . Achilles tendinitis 09/10/2010  . Allergic rhinitis 07/12/2010  . Hyperlipidemia 04/26/2007  . DM II (diabetes mellitus, type II), controlled ([place]) 04/22/2006  . OBESITY NOS 04/22/2006  . Essential hypertension 04/22/2006   Past Medical History:  Diagnosis Date  . Diabetes mellitus   . Hyperlipemia   . Hypertension   . Obesity   . OSA (obstructive sleep apnea)    on CPAP    Family History  Problem Relation Age of Onset  . Diabetes Mother   . Hypertension Neg Hx   . Coronary artery disease Neg Hx   . Colon cancer Neg Hx   . Prostate cancer Neg Hx     Past Surgical History:  Procedure Laterality Date  .  NO PAST SURGERIES     Social History   Occupational History  . 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 Smoking status: Never Smoker  . Smokeless tobacco: Never Used  Substance and Sexual Activity  . Alcohol use: No  . Drug use: No  . Sexual activity: Yes

## 2020-01-18 ENCOUNTER — Other Ambulatory Visit: Payer: Self-pay | Admitting: Internal Medicine

## 2020-02-09 ENCOUNTER — Other Ambulatory Visit: Payer: Self-pay | Admitting: Internal Medicine

## 2020-02-10 ENCOUNTER — Other Ambulatory Visit: Payer: Self-pay

## 2020-02-10 ENCOUNTER — Encounter: Payer: Self-pay | Admitting: Internal Medicine

## 2020-02-10 ENCOUNTER — Ambulatory Visit: Payer: 59 | Admitting: Internal Medicine

## 2020-02-10 VITALS — BP 125/83 | HR 99 | Temp 98.0°F | Resp 18 | Ht 74.0 in | Wt 318.1 lb

## 2020-02-10 DIAGNOSIS — E1140 Type 2 diabetes mellitus with diabetic neuropathy, unspecified: Secondary | ICD-10-CM | POA: Diagnosis not present

## 2020-02-10 DIAGNOSIS — I10 Essential (primary) hypertension: Secondary | ICD-10-CM | POA: Diagnosis not present

## 2020-02-10 DIAGNOSIS — M791 Myalgia, unspecified site: Secondary | ICD-10-CM

## 2020-02-10 MED ORDER — BLOOD GLUCOSE METER KIT: PACK | 0 refills | Status: DC

## 2020-02-10 NOTE — Telephone Encounter (Signed)
Appt later today.

## 2020-02-10 NOTE — Progress Notes (Signed)
Subjective:    Patient ID: [name], male    DOB: 1965/06/04, 54 y.o.   MRN: [id number]  DOS:  02/10/2020 Type of visit - description: Acute The patient's main concern is myalgias. For a while he had lower extremity pain with walking but in the last 6 months that has been significantly increased and now he also has upper extremity pains.  Has pain at the arms, forearms, shoulders, thighs and calves. Calf pain increased with walking.  Denies major problems with joint pain, no swelling or puffiness (does have a specific pain @ R knee for which he needs surgery).  Wt Readings from Last 3 Encounters:  02/10/20 (!) 318 lb 2 oz (144.3 kg)  01/11/20 (!) 315 lb (142.9 kg)  12/27/19 (!) 315 lb (142.9 kg)     Review of Systems Denies fever chills. No headache, no visual disturbances, no scalp paresthesias. No chest pain no difficulty breathing No nausea, vomiting, diarrhea or abdominal pain. When asked, admits to paresthesias, mostly at the right foot, middle area. Occasional back pain, no consistent or severe symptoms  Past Medical History:  Diagnosis Date  . Diabetes mellitus   . Hyperlipemia   . Hypertension   . Obesity   . OSA (obstructive sleep apnea)    on CPAP    Past Surgical History:  Procedure Laterality Date  . NO PAST SURGERIES      Allergies as of 02/10/2020      Reactions   Penicillins    REACTION: unspecified      Medication List       Accurate as of February 10, 2020 11:59 PM. If you have any questions, ask your nurse or doctor.        STOP taking these medications   cyclobenzaprine 10 MG tablet Commonly known as: FLEXERIL Stopped by: [name], MD   pravastatin 40 MG tablet Commonly known as: PRAVACHOL Stopped by: [name], MD   predniSONE 10 MG (21) Tbpk tablet Commonly known as: STERAPRED UNI-PAK 21 TAB Stopped by: [name], MD   traMADol 50 MG tablet Commonly known as: ULTRAM Stopped by: [name], MD     TAKE these medications     aspirin 81 MG tablet Take 81 mg by mouth daily.   blood glucose meter kit and supplies Use up to four times daily as directed. Dx: E11.9 Started by: [name], MD   etodolac 500 MG tablet Commonly known as: LODINE Take 1 tablet (500 mg total) by mouth 2 (two) times daily as needed.   fluticasone 50 MCG/ACT nasal spray Commonly known as: FLONASE Place 2 sprays into both nostrils daily.   losartan-hydrochlorothiazide 100-12.5 MG tablet Commonly known as: HYZAAR TAKE 1 TABLET BY MOUTH EVERY DAY   metFORMIN 1000 MG tablet Commonly known as: GLUCOPHAGE TAKE 1 TABLET (1,000 MG TOTAL) BY MOUTH 2 (TWO) TIMES DAILY WITH A MEAL.   OneTouch Delica Lancets 62E Misc Test blood sugar twice a day.  Dx code: E11.9   OneTouch Verio Flex System w/Device Kit Test blood sugar twice a day.  Dx code: E11.9   OneTouch Verio test strip Generic drug: glucose blood TEST BLOOD SUGAR TWICE A DAY. DX CODE: E11.9   tadalafil 20 MG tablet Commonly known as: CIALIS Take 0.5-1 tablets (10-20 mg total) by mouth daily as needed for erectile dysfunction.          Objective:   Physical Exam BP 125/83 (BP Location: Left Arm, Patient Position: Sitting, Cuff Size: Normal)  Pulse 99   Temp 98 F (36.7 C) (Oral)   Resp 18   Ht '6\' 2"'  (1.88 m)   Wt (!) 318 lb 2 oz (144.3 kg)   SpO2 98%   BMI 40.84 kg/m  General:   Well developed, NAD, BMI noted.  HEENT:  Normocephalic . Face symmetric, atraumatic Lungs:  CTA B Normal respiratory effort, no intercostal retractions, no accessory muscle use. Heart: RRR,  no murmur.  Abdomen:  Not distended, soft, non-tender. No rebound or rigidity.   Skin: Not pale. Not jaundice Lower extremities: no pretibial edema bilaterally.  Strong pedal pulses bilaterally, feet are warm and well perfused. Foot exam: Shallow, dry ulcer at the tip of the R great toe. Pinprick examination: Scattered areas of insensitivity distally . Neurologic:  alert & oriented X3.   Speech normal, gait appropriate and unassisted, some limping related to right knee. Motor and DTR symmetric. Psych--  Cognition and judgment appear intact.  Cooperative with normal attention span and concentration.  Behavior appropriate. No anxious or depressed appearing.     Assessment      Assessment DM DM neuropathy see OV 02-10-2020 HTN Hyperlipidemia DJD - knees  Morbid obesity Neck pain: chronic,see chiropractor on-off OSA, + CPAP Diastases recti   PLAN: Myalgias: The patient reported myalgias (without arthralgias) for the last 6 months, although leg pain is worse with walking, I do not think he has claudication, has a strong pedal pulses. Denies headaches or visual disturbances. He also has evidence of neuropathy. Plan: Sed rate, U38, folic acid, vitamin D, HIV, total CK.  TSH. Stop Pravachol Further advised with results.  Request something for pain, for now recommend to stick with Tylenol and etodolac (GI precautions discussed). DM: On Metformin, ambulatory CBGs check once a week, typically 145.  Recommend more frequent ambulatory CBGs.  Check A1c Diabetic neuropathy: -Some decreased pinprick sensitivity distally, feet care discussed.  He also reports numbness at the top of the right foot, it is steady numbness, not sure it that particular numbness  represent neuropathy. -Shallow ulcer R great toe:  ulcer was triggered by very tight shoes per patient, he already got a different pair of shoes.  No evidence of cellulitis.  Feet care discussed. HTN: BP today is good, on Hyzaar check a CMP, CBC. Preventive care: Aspirin: Self stopped aspirin due to news that aspirin is not be helpful for primary CAD prevention but that is for patients over 70.  Rec to restart. Had a flu shot, Research scientist (medical) booster. RTC 1 month   Time spent 42 minutes.  Assessing new problem (myalgias), having extensive discussion about diabetes neuropathy and new diagnosis, discussing feet care,  discussing need for aspirin.  This visit occurred during the SARS-CoV-2 public health emergency.  Safety protocols were in place, including screening questions prior to the visit, additional usage of staff PPE, and extensive cleaning of exam room while observing appropriate contact time as indicated for disinfecting solutions.

## 2020-02-10 NOTE — Progress Notes (Signed)
Pre visit review using our clinic review tool, if applicable. No additional management support is needed unless otherwise documented below in the visit note. 

## 2020-02-10 NOTE — Patient Instructions (Addendum)
Per our records you are due for an eye exam. Please contact your eye doctor to schedule an appointment. Please have them send copies of your office visit notes to [name]. Our fax number is [phone].   Okay to restart aspirin 81 mg daily  Stop Pravachol  Call anytime if severe aches, fever, chills, headaches.   Diabetes: Check your blood sugar  once a day or at least   3-4 times a week  Check your blood sugar  at different times of the day  GOALS: Fasting before a meal 70- 130 2 hours after a meal less than 180    GO TO THE FRONT DESK, PLEASE SCHEDULE YOUR APPOINTMENTS Come back for a checkup in 4 weeks    Diabetes Mellitus and [place] care is an important part of your health, especially when you have diabetes. Diabetes may cause you to have problems because of poor blood flow (circulation) to your feet and legs, which can cause your skin to:  Become thinner and drier.  Break more easily.  Heal more slowly.  Peel and crack. You may also have nerve damage (neuropathy) in your legs and feet, causing decreased feeling in them. This means that you may not notice minor injuries to your feet that could lead to more serious problems. Noticing and addressing any potential problems early is the best way to prevent future foot problems. How to care for your feet Foot hygiene  Wash your feet daily with warm water and mild soap. Do not use hot water. Then, pat your feet and the areas between your toes until they are completely dry. Do not soak your feet as this can dry your skin.  Trim your toenails straight across. Do not dig under them or around the cuticle. File the edges of your nails with an emery board or nail file.  Apply a moisturizing lotion or petroleum jelly to the skin on your feet and to dry, brittle toenails. Use lotion that does not contain alcohol and is unscented. Do not apply lotion between your toes. Shoes and socks  Wear clean socks or stockings every day.  Make sure they are not too tight. Do not wear knee-high stockings since they may decrease blood flow to your legs.  Wear shoes that fit properly and have enough cushioning. Always look in your shoes before you put them on to be sure there are no objects inside.  To break in new shoes, wear them for just a few hours a day. This prevents injuries on your feet. Wounds, scrapes, corns, and calluses  Check your feet daily for blisters, cuts, bruises, sores, and redness. If you cannot see the bottom of your feet, use a mirror or ask someone for help.  Do not cut corns or calluses or try to remove them with medicine.  If you find a minor scrape, cut, or break in the skin on your feet, keep it and the skin around it clean and dry. You may clean these areas with mild soap and water. Do not clean the area with peroxide, alcohol, or iodine.  If you have a wound, scrape, corn, or callus on your foot, look at it several times a day to make sure it is healing and not infected. Check for: ? Redness, swelling, or pain. ? Fluid or blood. ? Warmth. ? Pus or a bad smell. General instructions  Do not cross your legs. This may decrease blood flow to your feet.  Do not use heating  pads or hot water bottles on your feet. They may burn your skin. If you have lost feeling in your feet or legs, you may not know this is happening until it is too late.  Protect your feet from hot and cold by wearing shoes, such as at the beach or on hot pavement.  Schedule a complete foot exam at least once a year (annually) or more often if you have foot problems. If you have foot problems, report any cuts, sores, or bruises to your health care provider immediately. Contact a health care provider if:  You have a medical condition that increases your risk of infection and you have any cuts, sores, or bruises on your feet.  You have an injury that is not healing.  You have redness on your legs or feet.  You feel burning or  tingling in your legs or feet.  You have pain or cramps in your legs and feet.  Your legs or feet are numb.  Your feet always feel cold.  You have pain around a toenail. Get help right away if:  You have a wound, scrape, corn, or callus on your foot and: ? You have pain, swelling, or redness that gets worse. ? You have fluid or blood coming from the wound, scrape, corn, or callus. ? Your wound, scrape, corn, or callus feels warm to the touch. ? You have pus or a bad smell coming from the wound, scrape, corn, or callus. ? You have a fever. ? You have a red line going up your leg. Summary  Check your feet every day for cuts, sores, red spots, swelling, and blisters.  Moisturize feet and legs daily.  Wear shoes that fit properly and have enough cushioning.  If you have foot problems, report any cuts, sores, or bruises to your health care provider immediately.  Schedule a complete foot exam at least once a year (annually) or more often if you have foot problems. This information is not intended to replace advice given to you by your health care provider. Make sure you discuss any questions you have with your health care provider. Document Revised: 12/22/2018 Document Reviewed: 05/02/2016 Elsevier Patient Education  [place].

## 2020-02-11 NOTE — Assessment & Plan Note (Signed)
Myalgias: The patient reported myalgias (without arthralgias) for the last 6 months, although leg pain is worse with walking, I do not think he has claudication, has a strong pedal pulses. Denies headaches or visual disturbances. He also has evidence of neuropathy. Plan: Sed rate, R74, folic acid, vitamin D, HIV, total CK.  TSH. Stop Pravachol Further advised with results.  Request something for pain, for now recommend to stick with Tylenol and etodolac (GI precautions discussed). DM: On Metformin, ambulatory CBGs check once a week, typically 145.  Recommend more frequent ambulatory CBGs.  Check A1c Diabetic neuropathy: -Some decreased pinprick sensitivity distally, feet care discussed.  He also reports numbness at the top of the right foot, it is steady numbness, not sure it that particular numbness  represent neuropathy. -Shallow ulcer R great toe:  ulcer was triggered by very tight shoes per patient, he already got a different pair of shoes.  No evidence of cellulitis.  Feet care discussed. HTN: BP today is good, on Hyzaar check a CMP, CBC. Preventive care: Aspirin: Self stopped aspirin due to news that aspirin is not be helpful for primary CAD prevention but that is for patients over 70.  Rec to restart. Had a flu shot, Research scientist (medical) booster. RTC 1 month

## 2020-02-12 ENCOUNTER — Encounter: Payer: Self-pay | Admitting: Internal Medicine

## 2020-02-13 LAB — CBC WITH DIFFERENTIAL/PLATELET
Absolute Monocytes: 607 cells/uL (ref 200–950)
Basophils Absolute: 41 cells/uL (ref 0–200)
Basophils Relative: 0.6 %
Eosinophils Absolute: 331 cells/uL (ref 15–500)
Eosinophils Relative: 4.8 %
HCT: 39 % (ref 38.5–50.0)
Hemoglobin: 14 g/dL (ref 13.2–17.1)
Lymphs Abs: 1228 cells/uL (ref 850–3900)
MCH: 32 pg (ref 27.0–33.0)
MCHC: 35.9 g/dL (ref 32.0–36.0)
MCV: 89 fL (ref 80.0–100.0)
MPV: 11.1 fL (ref 7.5–12.5)
Monocytes Relative: 8.8 %
Neutro Abs: 4692 cells/uL (ref 1500–7800)
Neutrophils Relative %: 68 %
Platelets: 230 10*3/uL (ref 140–400)
RBC: 4.38 10*6/uL (ref 4.20–5.80)
RDW: 12.3 % (ref 11.0–15.0)
Total Lymphocyte: 17.8 %
WBC: 6.9 10*3/uL (ref 3.8–10.8)

## 2020-02-13 LAB — COMPREHENSIVE METABOLIC PANEL
AG Ratio: 1.6 (calc) (ref 1.0–2.5)
ALT: 16 U/L (ref 9–46)
AST: 14 U/L (ref 10–35)
Albumin: 4.2 g/dL (ref 3.6–5.1)
Alkaline phosphatase (APISO): 69 U/L (ref 35–144)
BUN: 23 mg/dL (ref 7–25)
CO2: 24 mmol/L (ref 20–32)
Calcium: 9 mg/dL (ref 8.6–10.3)
Chloride: 101 mmol/L (ref 98–110)
Creat: 1.24 mg/dL (ref 0.70–1.33)
Globulin: 2.7 g/dL (calc) (ref 1.9–3.7)
Glucose, Bld: 147 mg/dL — ABNORMAL HIGH (ref 65–99)
Potassium: 3.8 mmol/L (ref 3.5–5.3)
Sodium: 136 mmol/L (ref 135–146)
Total Bilirubin: 0.7 mg/dL (ref 0.2–1.2)
Total Protein: 6.9 g/dL (ref 6.1–8.1)

## 2020-02-13 LAB — SEDIMENTATION RATE: Sed Rate: 14 mm/h (ref 0–20)

## 2020-02-13 LAB — B12 AND FOLATE PANEL
Folate: 7.1 ng/mL
Vitamin B-12: 292 pg/mL (ref 200–1100)

## 2020-02-13 LAB — CK: Total CK: 183 U/L (ref 44–196)

## 2020-02-13 LAB — HEMOGLOBIN A1C
Hgb A1c MFr Bld: 6.1 % of total Hgb — ABNORMAL HIGH (ref <5.7)
Mean Plasma Glucose: 128 (calc)
eAG (mmol/L): 7.1 (calc)

## 2020-02-13 LAB — HIV ANTIBODY (ROUTINE TESTING W REFLEX): HIV 1&2 Ab, 4th Generation: NONREACTIVE

## 2020-02-13 LAB — VITAMIN D 25 HYDROXY (VIT D DEFICIENCY, FRACTURES): Vit D, 25-Hydroxy: 27 ng/mL — ABNORMAL LOW (ref 30–100)

## 2020-02-13 LAB — TSH: TSH: 3.09 mIU/L (ref 0.40–4.50)

## 2020-02-13 MED ORDER — GABAPENTIN 300 MG PO CAPS: ORAL_CAPSULE | ORAL | 2 refills | Status: DC

## 2020-02-13 MED ORDER — VITAMIN D (ERGOCALCIFEROL) 1.25 MG (50000 UNIT) PO CAPS: 50000.0000 [IU] | ORAL_CAPSULE | ORAL | 0 refills | Status: DC

## 2020-02-13 NOTE — Addendum Note (Signed)
Addended byDamita Dunnings D on: 02/13/2020 03:29 PM   Modules accepted: Orders

## 2020-02-24 ENCOUNTER — Encounter: Payer: Self-pay | Admitting: Internal Medicine

## 2020-03-04 ENCOUNTER — Other Ambulatory Visit: Payer: Self-pay | Admitting: Internal Medicine

## 2020-03-13 ENCOUNTER — Other Ambulatory Visit: Payer: Self-pay | Admitting: Internal Medicine

## 2020-03-14 ENCOUNTER — Other Ambulatory Visit: Payer: Self-pay

## 2020-03-14 ENCOUNTER — Ambulatory Visit: Payer: 59 | Admitting: Internal Medicine

## 2020-03-14 VITALS — BP 134/84 | HR 102 | Ht 74.0 in | Wt 325.0 lb

## 2020-03-14 DIAGNOSIS — M791 Myalgia, unspecified site: Secondary | ICD-10-CM

## 2020-03-14 DIAGNOSIS — G629 Polyneuropathy, unspecified: Secondary | ICD-10-CM

## 2020-03-14 MED ORDER — VITAMIN D (ERGOCALCIFEROL) 1.25 MG (50000 UNIT) PO CAPS: 50000.0000 [IU] | ORAL_CAPSULE | ORAL | 3 refills | Status: DC

## 2020-03-14 MED ORDER — GABAPENTIN 300 MG PO CAPS: 300.0000 mg | ORAL_CAPSULE | Freq: Three times a day (TID) | ORAL | 4 refills | Status: DC

## 2020-03-14 NOTE — Progress Notes (Signed)
Subjective:    Patient ID: [name], male    DOB: 10-18-1965, 53 y.o.   MRN: [id number]  DOS:  03/14/2020 Type of visit - description: Follow-up At the last office visit he was seen with myalgias, neuropathy, diabetes, etc. He stopped Pravachol, started gabapentin. He feels the gabapentin has helped his sxs to some extent .    Review of Systems No other concerns, denies neck pain  Past Medical History:  Diagnosis Date  . Diabetes mellitus   . Hyperlipemia   . Hypertension   . Obesity   . OSA (obstructive sleep apnea)    on CPAP    Past Surgical History:  Procedure Laterality Date  . NO PAST SURGERIES      Allergies as of 03/14/2020      Reactions   Penicillins    REACTION: unspecified      Medication List       Accurate as of March 14, 2020 11:59 PM. If you have any questions, ask your nurse or doctor.        aspirin 81 MG tablet Take 81 mg by mouth daily.   blood glucose meter kit and supplies Use up to four times daily as directed. Dx: E11.9   etodolac 500 MG tablet Commonly known as: LODINE Take 1 tablet (500 mg total) by mouth 2 (two) times daily as needed.   fluticasone 50 MCG/ACT nasal spray Commonly known as: FLONASE Place 2 sprays into both nostrils daily.   gabapentin 300 MG capsule Commonly known as: NEURONTIN Take 1 capsule (300 mg total) by mouth 3 (three) times daily. What changed:   how much to take  how to take this  when to take this  additional instructions Changed by: [name], MD   losartan-hydrochlorothiazide 100-12.5 MG tablet Commonly known as: HYZAAR TAKE 1 TABLET BY MOUTH EVERY DAY   metFORMIN 1000 MG tablet Commonly known as: GLUCOPHAGE Take 1 tablet (1,000 mg total) by mouth 2 (two) times daily with a meal.   OneTouch Delica Lancets 40J Misc Test blood sugar twice a day.  Dx code: E11.9   OneTouch Verio Flex System w/Device Kit Test blood sugar twice a day.  Dx code: E11.9   OneTouch Verio test  strip Generic drug: glucose blood TEST BLOOD SUGAR TWICE A DAY. DX CODE: E11.9   tadalafil 20 MG tablet Commonly known as: CIALIS Take 0.5-1 tablets (10-20 mg total) by mouth daily as needed for erectile dysfunction.   Vitamin D (Ergocalciferol) 1.25 MG (50000 UNIT) Caps capsule Commonly known as: DRISDOL Take 1 capsule (50,000 Units total) by mouth every 7 (seven) days.          Objective:   Physical Exam BP 134/84 (BP Location: Left Arm, Patient Position: Sitting, Cuff Size: Large)   Pulse (!) 102   Ht '6\' 2"'  (1.88 m)   Wt (!) 325 lb (147.4 kg)   SpO2 97%   BMI 41.73 kg/m  General:   Well developed, NAD, BMI noted. HEENT:  Normocephalic . Face symmetric, atraumatic. Neck: Full range of motion. Lungs:  CTA B Normal respiratory effort, no intercostal retractions, no accessory muscle use. Heart: RRR,  no murmur.  Lower extremities: no pretibial edema bilaterally  Skin: Not pale. Not jaundice Neurologic:  alert & oriented X3.  Speech normal, gait appropriate for age and unassisted Psych--  Cognition and judgment appear intact.  Cooperative with normal attention span and concentration.  Behavior appropriate. No anxious or depressed appearing.  Assessment     Assessment DM DM neuropathy see OV 02-10-2020 HTN Hyperlipidemia DJD - knees  Morbid obesity Neck pain: chronic,see chiropractor on-off OSA, + CPAP Diastases recti   PLAN: Follow-up from previous visit Myalgias: Work-up (Sed rate, N82, folic acid, vitamin D, HIV, total CK, TSH) negative except for low vitamin D.  Has no started vitamin D supplements just yet.  He started gabapentin and d/c Pravachol.  The patient believes gabapentin is helping to some extent but symptoms are definitely not gone. Plan: Neuro referral for myalgias and neuropathy. Continue gabapentin 300 mg 3 times daily. Diabetic neuropathy: On gabapentin, referring him to neurology.  The last time he was here he had a shallow  ulcer at the right great toe, reports is essentially gone. DM: Well-controlled, A1c 6.1, continue Metformin. Vitamin D deficiency: Recommend vitamin D OTC and start ergocalciferol, previous RX for some reason was not filled by the pharmacist. RTC 3 to 4 months.   This visit occurred during the SARS-CoV-2 public health emergency.  Safety protocols were in place, including screening questions prior to the visit, additional usage of staff PPE, and extensive cleaning of exam room while observing appropriate contact time as indicated for disinfecting solutions.

## 2020-03-14 NOTE — Patient Instructions (Addendum)
Get vitamin D3 1000 units over the counter:  2 tablets daily  At the same time start ergocalciferol 50,000 units: 1 tablet weekly      GO TO THE FRONT DESK, PLEASE SCHEDULE YOUR APPOINTMENTS Come back for  A check up in 3-4 months

## 2020-03-14 NOTE — Telephone Encounter (Signed)
Appt later today.

## 2020-03-17 NOTE — Assessment & Plan Note (Signed)
Follow-up from previous visit Myalgias: Work-up (Sed rate, I15, folic acid, vitamin D, HIV, total CK, TSH) negative except for low vitamin D.  Has no started vitamin D supplements just yet.  He started gabapentin and d/c Pravachol.  The patient believes gabapentin is helping to some extent but symptoms are definitely not gone. Plan: Neuro referral for myalgias and neuropathy. Continue gabapentin 300 mg 3 times daily. Diabetic neuropathy: On gabapentin, referring him to neurology.  The last time he was here he had a shallow ulcer at the right great toe, reports is essentially gone. DM: Well-controlled, A1c 6.1, continue Metformin. Vitamin D deficiency: Recommend vitamin D OTC and start ergocalciferol, previous RX for some reason was not filled by the pharmacist. RTC 3 to 4 months.

## 2020-03-19 ENCOUNTER — Encounter: Payer: Self-pay | Admitting: Neurology

## 2020-04-28 ENCOUNTER — Other Ambulatory Visit: Payer: Self-pay | Admitting: Internal Medicine

## 2020-05-09 ENCOUNTER — Other Ambulatory Visit: Payer: Self-pay | Admitting: Internal Medicine

## 2020-05-20 ENCOUNTER — Other Ambulatory Visit: Payer: Self-pay | Admitting: Internal Medicine

## 2020-06-11 ENCOUNTER — Other Ambulatory Visit (INDEPENDENT_AMBULATORY_CARE_PROVIDER_SITE_OTHER): Payer: 59

## 2020-06-11 ENCOUNTER — Other Ambulatory Visit: Payer: Self-pay

## 2020-06-11 ENCOUNTER — Encounter: Payer: Self-pay | Admitting: Neurology

## 2020-06-11 ENCOUNTER — Telehealth: Payer: Self-pay | Admitting: Internal Medicine

## 2020-06-11 ENCOUNTER — Ambulatory Visit: Payer: 59 | Admitting: Neurology

## 2020-06-11 VITALS — BP 142/86 | HR 86 | Ht 74.0 in | Wt 328.6 lb

## 2020-06-11 DIAGNOSIS — R202 Paresthesia of skin: Secondary | ICD-10-CM

## 2020-06-11 DIAGNOSIS — R292 Abnormal reflex: Secondary | ICD-10-CM

## 2020-06-11 DIAGNOSIS — M791 Myalgia, unspecified site: Secondary | ICD-10-CM | POA: Diagnosis not present

## 2020-06-11 LAB — SEDIMENTATION RATE: Sed Rate: 6 mm/hr (ref 0–20)

## 2020-06-11 LAB — CK: Total CK: 112 U/L (ref 7–232)

## 2020-06-11 LAB — C-REACTIVE PROTEIN: CRP: <1 mg/dL (ref 0.5–20.0)

## 2020-06-11 MED ORDER — GABAPENTIN 300 MG PO CAPS: 600.0000 mg | ORAL_CAPSULE | Freq: Three times a day (TID) | ORAL | 1 refills | Status: DC

## 2020-06-11 NOTE — Progress Notes (Signed)
[facility] Clinic Note - Initial Visit   Date: 06/11/20  [name] MRN: [id number] DOB: Nov 12, 1965   Dear Dr. [name]:  Thank you for your kind referral of [name] for consultation of neuropathy. Although his history is well known to you, please allow [name] to reiterate it for the purpose of our medical record. The patient was accompanied to the clinic by self.    History of Present Illness: URI TURNBOUGH is a 55 y.o. left-handed male with diabetes mellitus, hyperlipidemia, hypertension, OSA on CPAP presenting for evaluation of neuropathy and myalgias. Starting around 2019, he began having right foot numbness/tingling over the sole and top of the foot. Symptoms are constant without exacerbating or alleviating factors.  He denies similar symptoms in the left foot.    He also complains of diffuse, throbbing/achy, muscle pain in the upper arms, thighs, and lower legs.  Muscles are tender to palpation.  There is no weakness and he is able to complete daily tasks, but his pain can limit him.  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  His statin medication was stopped without any improvement.  CK from October was normal. He was started on gabapentin 335m TID, which he self titrated to 6048mTID for his muscle pain. He has not seen rheumatology.  He denies cramps or dark-colored urine.  He has had well-controlled diabetic for 20+ years.   Out-side paper records, electronic medical record, and images have been reviewed where available and summarized as:   Lab Results  Component Value Date   CKTOTAL 183 02/10/2020   Lab Results  Component Value Date   HGBA1C 6.1 (H) 02/10/2020   Lab Results  Component Value Date   VIWUJWJXBJ47 8290/29/2021   Lab Results  Component Value Date   TSH 3.09 02/10/2020   Lab Results  Component Value Date   ESRSEDRATE 14 02/10/2020    Past Medical History:  Diagnosis Date  . Diabetes mellitus    . Hyperlipemia   . Hypertension   . Obesity   . OSA (obstructive sleep apnea)    on CPAP    Past Surgical History:  Procedure Laterality Date  . NO PAST SURGERIES       Medications:  Outpatient Encounter Medications as of 06/11/2020  Medication Sig  . aspirin 81 MG tablet Take 81 mg by mouth daily.    . blood glucose meter kit and supplies Use up to four times daily as directed. Dx: E11.9  . Blood Glucose Monitoring Suppl (ONETOUCH VERIO FLEX SYSTEM) w/Device KIT Test blood sugar twice a day.  Dx code: E11.9  . etodolac (LODINE) 500 MG tablet Take 1 tablet (500 mg total) by mouth 2 (two) times daily as needed.  . fluticasone (FLONASE) 50 MCG/ACT nasal spray Place 2 sprays into both nostrils daily.  . Marland Kitchenabapentin (NEURONTIN) 300 MG capsule Take 1 capsule (300 mg total) by mouth 3 (three) times daily.  . Marland Kitchenosartan-hydrochlorothiazide (HYZAAR) 100-12.5 MG tablet TAKE 1 TABLET BY MOUTH EVERY DAY  . metFORMIN (GLUCOPHAGE) 1000 MG tablet Take 1 tablet (1,000 mg total) by mouth 2 (two) times daily with a meal.  . OneTouch Delica Lancets 3356OISC Test blood sugar twice a day.  Dx code: E11.9  . ONETOUCH VERIO test strip TEST BLOOD SUGAR TWICE A DAY. DX CODE: E11.9  . tadalafil (CIALIS) 20 MG tablet Take 0.5-1 tablets (10-20 mg total) by mouth daily as needed for erectile dysfunction.  .Marland Kitchen  Vitamin D, Ergocalciferol, (DRISDOL) 1.25 MG (50000 UNIT) CAPS capsule Take 1 capsule (50,000 Units total) by mouth every 7 (seven) days.   No facility-administered encounter medications on file as of 06/11/2020.    Allergies:  Allergies  Allergen Reactions  . Penicillins     REACTION: unspecified    Family History: Family History  Problem Relation Age of Onset  . Diabetes Mother   . Hypertension Neg Hx   . Coronary artery disease Neg Hx   . Colon cancer Neg Hx   . Prostate cancer Neg Hx     Social History: Social History   Tobacco Use  . Smoking status: Never Smoker  . Smokeless tobacco:  Never Used  Vaping Use  . Vaping Use: Never used  Substance Use Topics  . Alcohol use: No  . Drug use: No   Social History   Social History Narrative   Remarried for the 3rd time w/ a lady for the [place]   Children:  2010 , 2012   Left handed                 Vital Signs:  BP (!) 142/86   Pulse 86   Ht '6\' 2"'  (1.88 m)   Wt (!) 328 lb 9.6 oz (149.1 kg)   SpO2 99%   BMI 42.19 kg/m   Neurological Exam: MENTAL STATUS including orientation to time, place, person, recent and remote memory, attention span and concentration, language, and fund of knowledge is normal.  Speech is not dysarthric.  CRANIAL NERVES: II:  No visual field defects.    III-IV-VI: Pupils equal round and reactive to light.  Normal conjugate, extra-ocular eye movements in all directions of gaze.  No nystagmus.  No ptosis.   V:  Normal facial sensation.    VII:  Normal facial symmetry and movements.   VIII:  Normal hearing and vestibular function.   IX-X:  Normal palatal movement.   XI:  Normal shoulder shrug and head rotation.   XII:  Normal tongue strength and range of motion, no deviation or fasciculation.  MOTOR: Muscle bulk intact. No atrophy, fasciculations or abnormal movements.  No pronator drift. He is tender to palpation over the large muscles of the arms and legs.   Upper Extremity:  Right  Left  Deltoid  5/5   5/5   Biceps  5/5   5/5   Triceps  5/5   5/5   Infraspinatus 5/5  5/5  Medial pectoralis 5/5  5/5  Wrist extensors  5/5   5/5   Wrist flexors  5/5   5/5   Finger extensors  5/5   5/5   Finger flexors  5/5   5/5   Dorsal interossei  5/5   5/5   Abductor pollicis  5/5   5/5   Tone (Ashworth scale)  0  0   Lower Extremity:  Right  Left  Hip flexors  5/5   5/5   Hip extensors  5/5   5/5   Adductor 5/5  5/5  Abductor 5/5  5/5  Knee flexors  5/5   5/5   Knee extensors  5/5   5/5   Dorsiflexors  5/5   5/5   Plantarflexors  5/5   5/5   Toe extensors  5/5   5/5   Toe flexors   5/5   5/5   Tone (Ashworth scale)  0  0   MSRs:  Right        Left  brachioradialis 2+  2+  biceps 2+  2+  triceps 2+  2+  patellar 3+  3+  ankle jerk 2+  2+  Hoffman no  no  plantar response down  down   SENSORY:  Vibration reduced over the right great toe, intact on the left foot and above the ankles bilaterally.  Normal and symmetric perception of light touch, pinprick, vibration, and proprioception.  Romberg's sign absent.   COORDINATION/GAIT: Normal finger-to- nose-finger.  Intact rapid alternating movements bilaterally. Gait narrow based and stable. Tandem and stressed gait intact.    IMPRESSION: 1. Right foot paresthesias may be suggestive of lumbosacral canal stenosis/radiculopathy, less so neuropathy since his symptoms are asymmetric and his reflexes are intact distally, which is atypical for neuropathy  - MRI lumbar spine wo contrast  - NCS/EMG of the legs  2. Myalgias ?inflammtory-mediated process.  No evidence of primary neuromuscular etiology.  - Check ESR, CRP, CK, myositis panel  - Patient has requesting increase in gabapentin to 649m TID which has helped some with his mylagias.  I have sent 30-day supply with 1 refill and asked him to get future refills through his PCP, as he is not using this to manage his neuropathic pain, but moreso myalgias and I do not see a primary neuromuscular etiology for his muscle pain.  I will screen him for autoimmune myopathy, he may benefit from seeing rheumatology for myalgias, which I will defer to his PCP  Further recommendations pending results.   Thank you for allowing me to participate in patient's care.  If I can answer any additional questions, I would be pleased to do so.    Sincerely,    Arias Weinert K. PPosey Pronto DO

## 2020-06-11 NOTE — Telephone Encounter (Signed)
I believe neurology recommended a MRI and NCV-EMG, I recommend to proceed with testing before any other referral.

## 2020-06-11 NOTE — Patient Instructions (Signed)
Check labs  MRI lumbar spine wo contrast  Nerve testing of the right arm and leg.  Do not apply any lotion or moisturizer to your arms and legs    ELECTROMYOGRAM AND NERVE CONDUCTION STUDIES (EMG/NCS) INSTRUCTIONS  How to Prepare The neurologist conducting the EMG will need to know if you have certain medical conditions. Tell the neurologist and other EMG lab personnel if you: . Have a pacemaker or any other electrical medical device . Take blood-thinning medications . Have hemophilia, a blood-clotting disorder that causes prolonged bleeding Bathing Take a shower or bath shortly before your exam in order to remove oils from your skin. Don't apply lotions or creams before the exam.  What to Expect You'll likely be asked to change into a hospital gown for the procedure and lie down on an examination table. The following explanations can help you understand what will happen during the exam.  . Electrodes. The neurologist or a technician places surface electrodes at various locations on your skin depending on where you're experiencing symptoms. Or the neurologist may insert needle electrodes at different sites depending on your symptoms.  . Sensations. The electrodes will at times transmit a tiny electrical current that you may feel as a twinge or spasm. The needle electrode may cause discomfort or pain that usually ends shortly after the needle is removed. If you are concerned about discomfort or pain, you may want to talk to the neurologist about taking a short break during the exam.  . Instructions. During the needle EMG, the neurologist will assess whether there is any spontaneous electrical activity when the muscle is at rest - activity that isn't present in healthy muscle tissue - and the degree of activity when you slightly contract the muscle.  He or she will give you instructions on resting and contracting a muscle at appropriate times. Depending on what muscles and nerves the neurologist  is examining, he or she may ask you to change positions during the exam.  After your EMG You may experience some temporary, minor bruising where the needle electrode was inserted into your muscle. This bruising should fade within several days. If it persists, contact your primary care doctor.

## 2020-06-11 NOTE — Telephone Encounter (Signed)
Please advise 

## 2020-06-11 NOTE — Telephone Encounter (Signed)
Patient was seen today by neurology for leg, knee pain. Patient was told he needed to see a rheumatologist.

## 2020-06-11 NOTE — Telephone Encounter (Signed)
LMOM informing Pt of recommendations.  °

## 2020-06-19 LAB — MYOSITIS ASSESSR PLUS JO-1 AUTOABS
EJ Autoabs: NOT DETECTED
Jo-1 Autoabs: <1 AI
Ku Autoabs: NOT DETECTED
Mi-2 Autoabs: NOT DETECTED
OJ Autoabs: NOT DETECTED
PL-12 Autoabs: NOT DETECTED
PL-7 Autoabs: NOT DETECTED
SRP Autoabs: NOT DETECTED

## 2020-06-20 ENCOUNTER — Telehealth: Payer: Self-pay

## 2020-06-20 NOTE — Telephone Encounter (Signed)
-----   Message from Alda Berthold, DO sent at 06/20/2020  9:02 AM EST ----- Please inform patient that his labs are normal and does not show any signs of muscle disease causing his muscle pain.  Recommend follow-up with his PCP for additional guidance.

## 2020-06-20 NOTE — Telephone Encounter (Signed)
Called patient and left a message for a call back.  

## 2020-06-20 NOTE — Telephone Encounter (Signed)
Patient returned call and was informed of labs and reccomendations. Patient confirmed he was scheduled for his EMG on 07/25/20 at 8am.

## 2020-06-23 ENCOUNTER — Ambulatory Visit
Admission: RE | Admit: 2020-06-23 | Discharge: 2020-06-23 | Disposition: A | Discharge status: Home / Self Care | Payer: 59 | Source: Ambulatory Visit | Attending: Neurology | Admitting: Neurology

## 2020-06-23 DIAGNOSIS — R202 Paresthesia of skin: Secondary | ICD-10-CM

## 2020-06-23 DIAGNOSIS — R292 Abnormal reflex: Secondary | ICD-10-CM

## 2020-06-23 DIAGNOSIS — M791 Myalgia, unspecified site: Secondary | ICD-10-CM

## 2020-06-23 IMAGING — MR MR LUMBAR SPINE W/O CM
4 of 5 series · 24 of 48 positions shown · non-contrast
Comparison: None.

CLINICAL DATA: Hyper reflexia.  Paresthesia of right foot

EXAM:
MRI LUMBAR SPINE WITHOUT CONTRAST
TECHNIQUE: Multiplanar, multisequence MR imaging of the lumbar spine was
performed. No intravenous contrast was administered.

[Series 3: T2 · sagittal · 4.0mm · 0.53mm/px · 6 of 18 slices shown (1 of 2)]
[im 1/18]
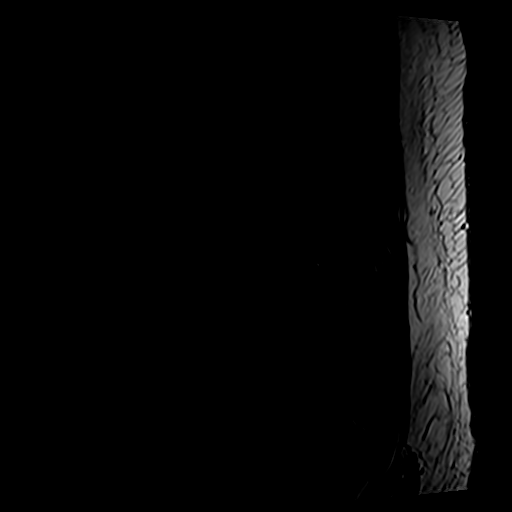
[im 4/18]
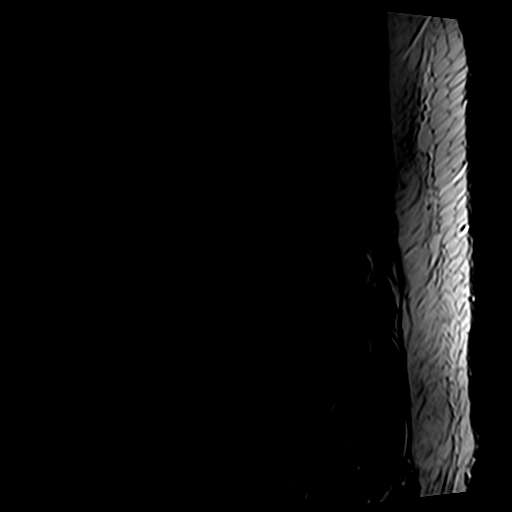
[im 7/18]
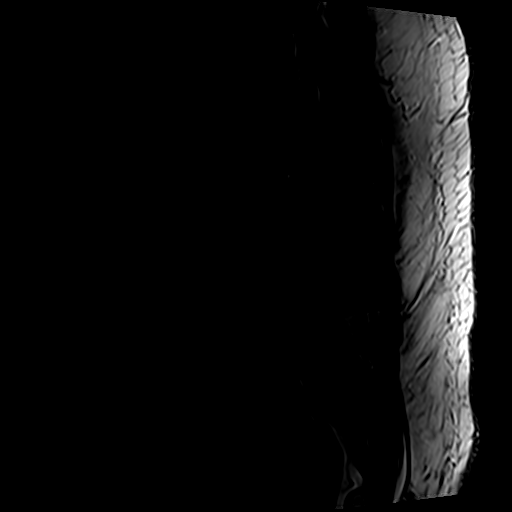
[im 11/18]
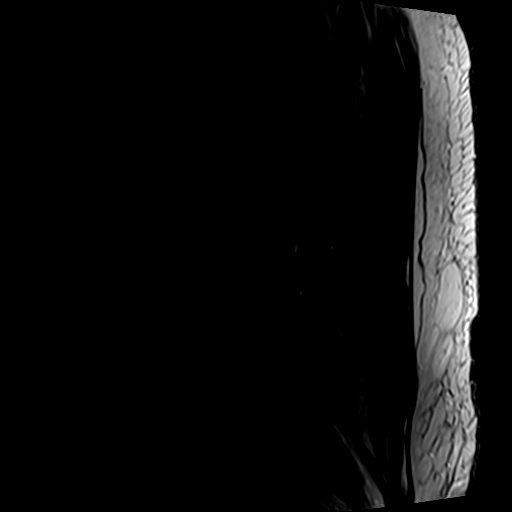
[im 14/18]
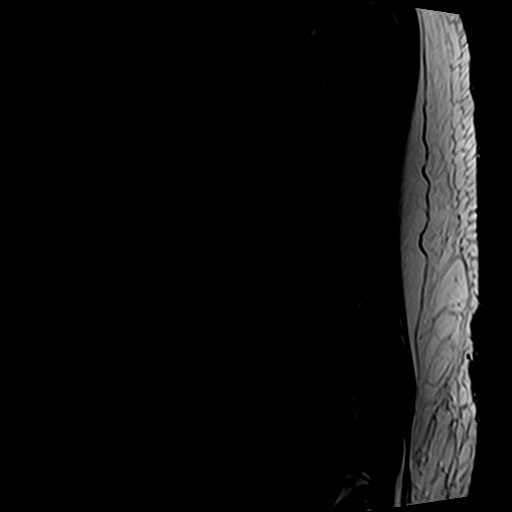
[im 18/18]
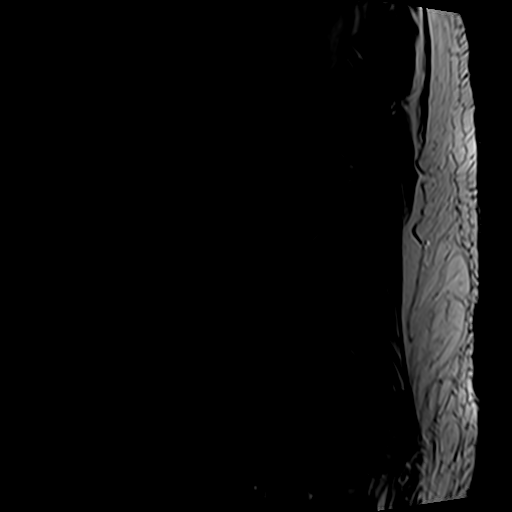

[Series 5: T1 · sagittal · 4.0mm · 0.53mm/px · 7 of 18 slices shown (1 of 2)]
[im 1/18]
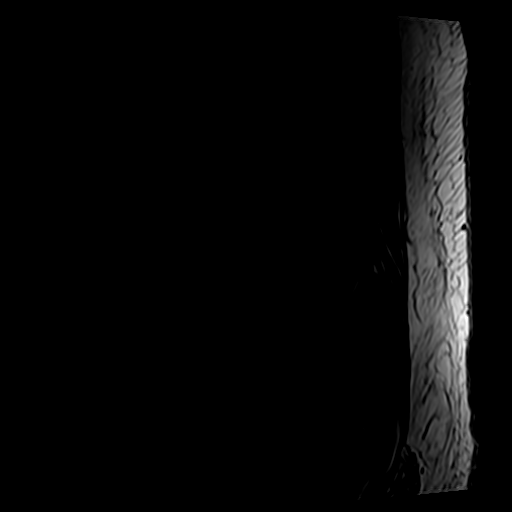
[im 3/18]
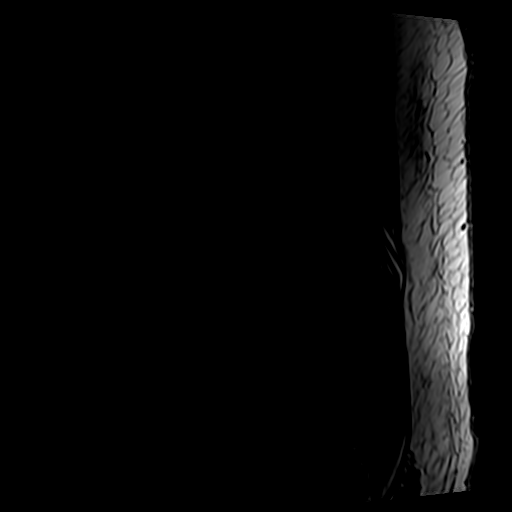
[im 6/18]
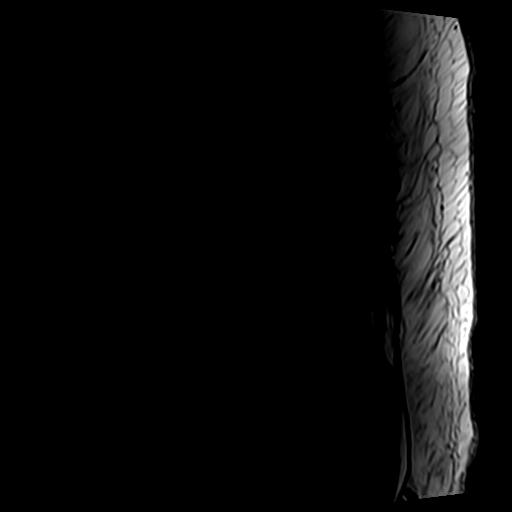
[im 9/18]
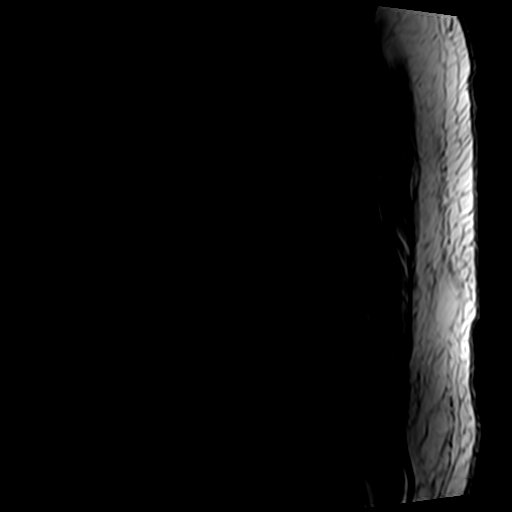
[im 12/18]
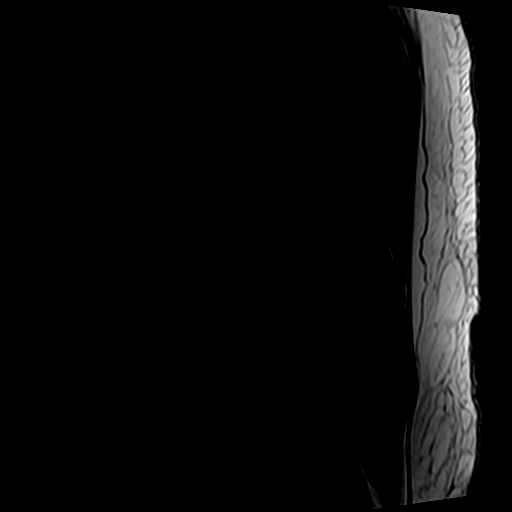
[im 15/18]
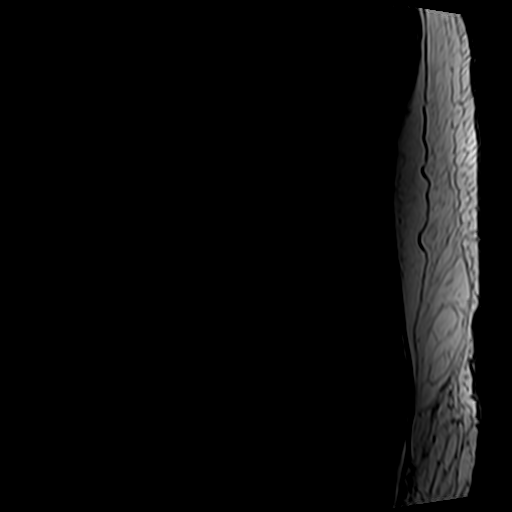
[im 18/18]
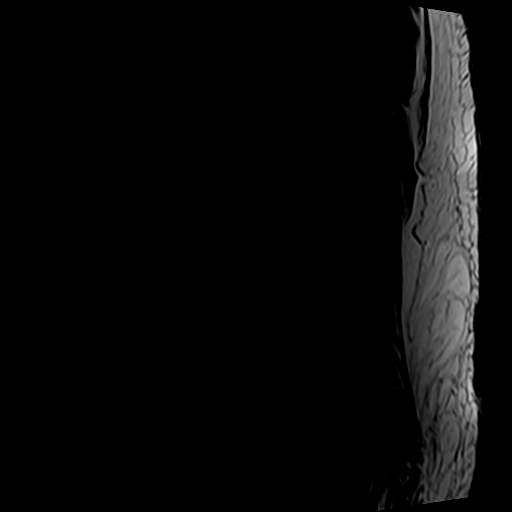

[Series 6: T2 · axial · 4.0mm · 0.70mm/px · z∈[-29,+171]mm · 8 of 37 slices shown (2 of 2)]
[im 1/37]
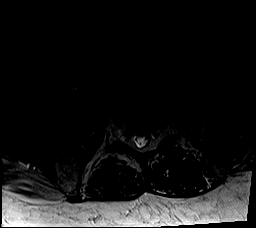
[im 6/37]
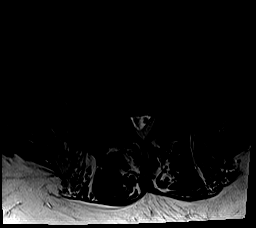
[im 12/37]
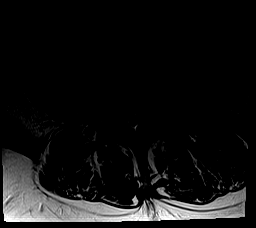
[im 17/37]
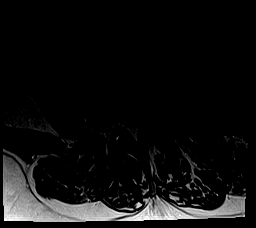
[im 20/37]
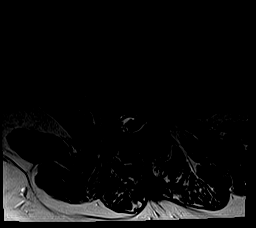
[im 25/37]
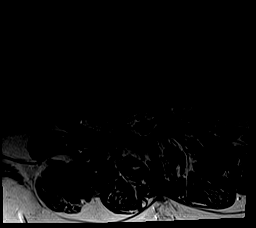
[im 31/37]
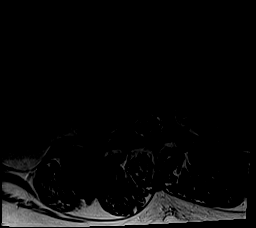
[im 37/37]
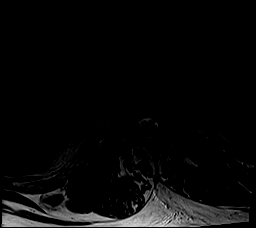

[Series 7: T1 · axial · 4.0mm · 0.35mm/px · z∈[-4,+140]mm · 3 of 37 slices shown (2 of 2)]
[im 6/37]
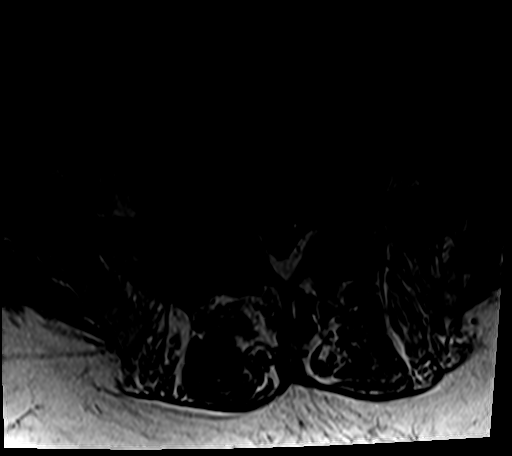
[im 20/37]
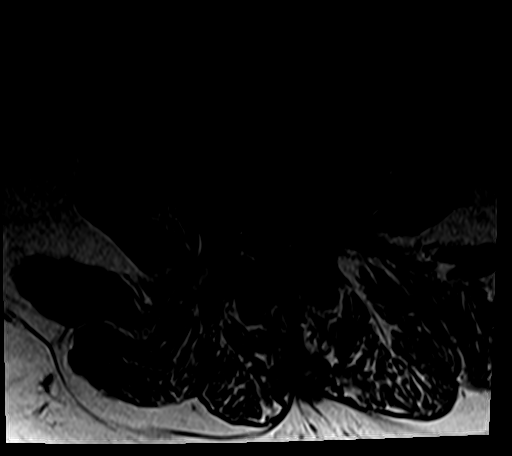
[im 31/37]
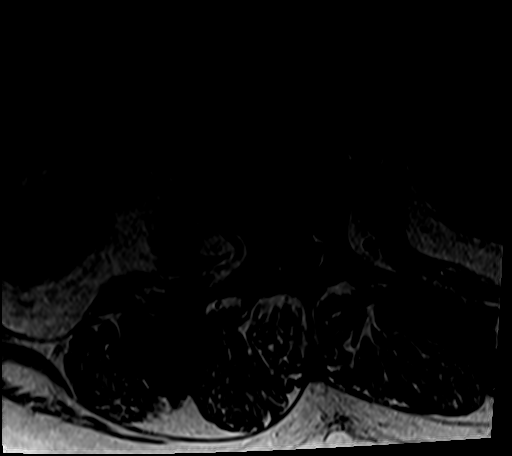

[24 of 48 positions shown; findings below may reference images not displayed]

FINDINGS: Segmentation: 5 lumbar type vertebrae based on the available
coverage

Alignment: Degenerative reversal of lumbar lordosis with mild
dextrocurvature.

Vertebrae: No fracture, evidence of discitis, or bone lesion.
Discogenic endplate edema at L2-3.

Conus medullaris and cauda equina: Conus extends to the L1 level.
Normal signal conus. There is upper cauda equina redundancy due to
severe spinal stenosis.

Paraspinal and other soft tissues: No perispinal mass or
inflammation is seen.

Disc levels:

T12- L1: Ventral spondylitic spurring.

L1-L2: Disc collapse and endplate degeneration with endplate ridging
asymmetric to the right. Severe spinal stenosis with complete
effacement of CSF

L2-L3: Disc narrowing and endplate degeneration with bulging and
spurring. Degenerative facet spurring on both sides. Degenerative
changes and epidural fat effacement cause advanced thecal sac
narrowing. Noncompressive bilateral foraminal stenosis

L3-L4: Disc collapse and endplate degeneration with endplate ridging
asymmetric to the left. Asymmetric left facet osteoarthritis.
Degenerative changes, epidural fat, and short pedicles cause
advanced thecal sac compression. There is a left foraminal
protrusion impinging on the left L3 nerve root

L4-L5: Disc narrowing and bulging with endplate ridging. Endplate
degeneration. Degenerative facet spurring on the left more than
right. Advanced thecal sac compression. Left more than right
foraminal stenosis with high-grade impingement on the left

L5-S1:Disc narrowing and bulging with endplate spurring eccentric to
the right far lateral region. Severe right facet osteoarthritis.
Severe right foraminal impingement. The right extraforaminal L5
nerve root is more T2 hyperintense than the left. Left foraminal
narrowing is moderate.
IMPRESSION: 1. Advanced and generalized lumbar spine degeneration exacerbated by
short pedicles and epidural fat.
2. Advanced and compressive thecal sac stenosis from L1-2 to L4-5.
3. Advanced foraminal impingement on the right at L5-S1 which may
have resulted in chronic structural changes to the right
extraforaminal L5 nerve root.
4. On the left is advanced foraminal impingement at L3-4 and L4-5.

## 2020-06-26 ENCOUNTER — Telehealth: Payer: Self-pay

## 2020-06-26 NOTE — Telephone Encounter (Addendum)
Patient has been informed via mychart of results.     06/28/2020 at 9:19am. Called and left a message for patient to give me a call back. Need to find out if patient is agreeable to having a referral sent to [facility]. Patient has not responded to Mychart message and informed him we are waiting for a response via message.

## 2020-06-26 NOTE — Telephone Encounter (Signed)
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 Findings explain his foot numbness and low back pain. The next step is to refer him to neurosurgery so they can discuss management options, including surgery. Please send referral for lumbar spondylosis, lumbosacral canal stenosis as ASAP.  Thanks.

## 2020-06-26 NOTE — Telephone Encounter (Signed)
Patient has been contacted via [facility]

## 2020-06-26 NOTE — Telephone Encounter (Signed)
Patient called at 10:15am and was left a voicemail to return call.

## 2020-06-26 NOTE — Progress Notes (Signed)
LMOVM please give the office a call back.

## 2020-06-28 ENCOUNTER — Encounter: Payer: Self-pay | Admitting: Pulmonary Disease

## 2020-06-28 ENCOUNTER — Other Ambulatory Visit: Payer: Self-pay

## 2020-06-28 ENCOUNTER — Ambulatory Visit (INDEPENDENT_AMBULATORY_CARE_PROVIDER_SITE_OTHER): Payer: 59 | Admitting: Pulmonary Disease

## 2020-06-28 VITALS — BP 132/72 | HR 115 | Temp 97.4°F | Ht 74.0 in | Wt 326.8 lb

## 2020-06-28 DIAGNOSIS — G4733 Obstructive sleep apnea (adult) (pediatric): Secondary | ICD-10-CM | POA: Diagnosis not present

## 2020-06-28 DIAGNOSIS — M4306 Spondylolysis, lumbar region: Secondary | ICD-10-CM

## 2020-06-28 DIAGNOSIS — M4807 Spinal stenosis, lumbosacral region: Secondary | ICD-10-CM

## 2020-06-28 NOTE — Patient Instructions (Signed)
History of severe obstructive sleep apnea -Patient becoming dysfunctional  We will schedule you for home sleep study Update your results as soon as reviewed  Inform your medical supply company once we have study results follow-up with your machine  Continue using your machine at present  Weight loss efforts  Tentative follow-up in 3 to 4 months

## 2020-06-28 NOTE — Progress Notes (Signed)
[name]    [id number]    10-14-1965  Primary Care Physician:Paz, Alda Berthold, MD  Referring Physician: Colon Branch, [address] [place],  [place] [address]  Chief complaint:   Patient with a history of severe obstructive sleep apnea  HPI:  Patient is becoming dysfunctional with flushing messages of admission beyond motor lifespan  Previous sleep study from 2014 did reveal severe obstructive sleep apnea Currently uses CPAP on a regular basis Usually goes to bed between 10 and 11 PM Takes him about 30 minutes to fall asleep About 2 awakenings Final wake up time about 5 AM  Continues to benefit from CPAP Wakes up feeling like is at a good nights rest  Still has occasional dryness of his mouth in the mornings No morning headaches No night sweats Memory is good  No family history of obstructive sleep apnea  He has hypertension, diabetes, hypercholesterolemia well controlled  Non-smoker  Outpatient Encounter Medications as of 06/28/2020  Medication Sig  . aspirin 81 MG tablet Take 81 mg by mouth daily.    . blood glucose meter kit and supplies Use up to four times daily as directed. Dx: E11.9  . Blood Glucose Monitoring Suppl (ONETOUCH VERIO FLEX SYSTEM) w/Device KIT Test blood sugar twice a day.  Dx code: E11.9  . etodolac (LODINE) 500 MG tablet Take 1 tablet (500 mg total) by mouth 2 (two) times daily as needed.  . fluticasone (FLONASE) 50 MCG/ACT nasal spray Place 2 sprays into both nostrils daily.  Marland Kitchen gabapentin (NEURONTIN) 300 MG capsule Take 2 capsules (600 mg total) by mouth 3 (three) times daily.  Marland Kitchen losartan-hydrochlorothiazide (HYZAAR) 100-12.5 MG tablet TAKE 1 TABLET BY MOUTH EVERY DAY  . metFORMIN (GLUCOPHAGE) 1000 MG tablet Take 1 tablet (1,000 mg total) by mouth 2 (two) times daily with a meal.  . OneTouch Delica Lancets 01X MISC Test blood sugar twice a day.  Dx code: E11.9  . ONETOUCH VERIO test strip TEST BLOOD SUGAR TWICE A DAY.  DX CODE: E11.9  . tadalafil (CIALIS) 20 MG tablet Take 0.5-1 tablets (10-20 mg total) by mouth daily as needed for erectile dysfunction.  . Vitamin D, Ergocalciferol, (DRISDOL) 1.25 MG (50000 UNIT) CAPS capsule Take 1 capsule (50,000 Units total) by mouth every 7 (seven) days.   No facility-administered encounter medications on file as of 06/28/2020.    Allergies as of 06/28/2020 - Review Complete 06/28/2020  Allergen Reaction Noted  . Penicillins  04/20/2006    Past Medical History:  Diagnosis Date  . Diabetes mellitus   . Hyperlipemia   . Hypertension   . Obesity   . OSA (obstructive sleep apnea)    on CPAP    Past Surgical History:  Procedure Laterality Date  . NO PAST SURGERIES      Family History  Problem Relation Age of Onset  . Diabetes Mother   . Hypertension Neg Hx   . Coronary artery disease Neg Hx   . Colon cancer Neg Hx   . Prostate cancer Neg Hx     Social History   Socioeconomic History  . Marital status: Married    Spouse name: [name]  . Number of children: 2  . Years of education: Not on file  . Highest education level: Not on file  Occupational History  . Occupation: Therapist, art: [employer]  . Occupation: Music therapist: [employer]  Tobacco Use  . Smoking status: Never Smoker  . Smokeless tobacco: Never Used  Vaping Use  . Vaping Use: Never used  Substance and Sexual Activity  . Alcohol use: No  . Drug use: No  . Sexual activity: Yes  Other Topics Concern  . Not on file  Social History Narrative   Remarried for the 3rd time w/ a lady for the [place]   Children:  2010 , 2012   Left handed                Social Determinants of Health   Financial Resource Strain: Not on file  Food Insecurity: Not on file  Transportation Needs: Not on file  Physical Activity: Not on file  Stress: Not on file  Social Connections: Not on file  Intimate Partner Violence: Not on file    Review of Systems   Respiratory: Positive for apnea.   Psychiatric/Behavioral: Positive for sleep disturbance.  All other systems reviewed and are negative.   Vitals:   06/28/20 1536  BP: 132/72  Pulse: (!) 115  Temp: (!) 97.4 F (36.3 C)  SpO2: 98%     Physical Exam Constitutional:      Appearance: He is obese.  HENT:     Head: Normocephalic and atraumatic.     Nose: No congestion.     Mouth/Throat:     Mouth: Mucous membranes are moist.     Comments: Moist oral mucosa, Mallampati 3, crowded oropharynx Eyes:     General:        Right eye: No discharge.        Left eye: No discharge.     Pupils: Pupils are equal, round, and reactive to light.  Cardiovascular:     Rate and Rhythm: Normal rate and regular rhythm.     Heart sounds: No murmur heard. No friction rub.  Pulmonary:     Effort: No respiratory distress.     Breath sounds: No stridor. No wheezing or rhonchi.  Musculoskeletal:     Cervical back: No rigidity or tenderness.  Neurological:     Mental Status: He is alert.  Psychiatric:        Mood and Affect: Mood normal.   Epworth of 3  Data Reviewed: Previous study reviewed showing severe obstructive sleep apnea-2014  Assessment:  Severe obstructive sleep apnea -Compliant with CPAP therapy  Obesity  Dysfunctional CPAP machine  Pathophysiology of sleep disordered breathing reviewed with the patient  Plan/Recommendations: Schedule patient for home sleep study  We will set him up with a new CPAP once results are back  Encouraged to continue using current CPAP   Sherrilyn Rist MD [facility] 06/28/2020, 3:53 PM  CC: Colon Branch, MD

## 2020-07-01 ENCOUNTER — Other Ambulatory Visit: Payer: Self-pay | Admitting: Internal Medicine

## 2020-07-02 MED ORDER — METFORMIN HCL 1000 MG PO TABS: 1000.0000 mg | ORAL_TABLET | Freq: Two times a day (BID) | ORAL | 1 refills | Status: DC

## 2020-07-02 MED ORDER — ETODOLAC 500 MG PO TABS: 500.0000 mg | ORAL_TABLET | Freq: Two times a day (BID) | ORAL | 1 refills | Status: DC | PRN

## 2020-07-02 NOTE — Addendum Note (Signed)
Addended byDamita Dunnings D on: 07/02/2020 09:04 AM   Modules accepted: Orders

## 2020-07-04 ENCOUNTER — Encounter: Payer: Self-pay | Admitting: Internal Medicine

## 2020-07-18 ENCOUNTER — Other Ambulatory Visit: Payer: Self-pay | Admitting: Neurosurgery

## 2020-07-18 DIAGNOSIS — M48062 Spinal stenosis, lumbar region with neurogenic claudication: Secondary | ICD-10-CM

## 2020-07-24 ENCOUNTER — Other Ambulatory Visit: Payer: Self-pay

## 2020-07-24 ENCOUNTER — Ambulatory Visit: Payer: 59

## 2020-07-24 ENCOUNTER — Encounter: Payer: Self-pay | Admitting: Internal Medicine

## 2020-07-24 DIAGNOSIS — G4733 Obstructive sleep apnea (adult) (pediatric): Secondary | ICD-10-CM | POA: Diagnosis not present

## 2020-07-25 ENCOUNTER — Ambulatory Visit: Payer: 59 | Attending: Neurosurgery

## 2020-07-25 ENCOUNTER — Ambulatory Visit: Payer: 59 | Admitting: Neurology

## 2020-07-25 ENCOUNTER — Telehealth: Payer: Self-pay | Admitting: Neurology

## 2020-07-25 DIAGNOSIS — R292 Abnormal reflex: Secondary | ICD-10-CM

## 2020-07-25 DIAGNOSIS — M6281 Muscle weakness (generalized): Secondary | ICD-10-CM | POA: Insufficient documentation

## 2020-07-25 DIAGNOSIS — G5601 Carpal tunnel syndrome, right upper limb: Secondary | ICD-10-CM

## 2020-07-25 DIAGNOSIS — G8929 Other chronic pain: Secondary | ICD-10-CM | POA: Insufficient documentation

## 2020-07-25 DIAGNOSIS — R262 Difficulty in walking, not elsewhere classified: Secondary | ICD-10-CM | POA: Insufficient documentation

## 2020-07-25 DIAGNOSIS — M791 Myalgia, unspecified site: Secondary | ICD-10-CM | POA: Diagnosis not present

## 2020-07-25 DIAGNOSIS — M5412 Radiculopathy, cervical region: Secondary | ICD-10-CM

## 2020-07-25 DIAGNOSIS — M5417 Radiculopathy, lumbosacral region: Secondary | ICD-10-CM

## 2020-07-25 DIAGNOSIS — M545 Low back pain, unspecified: Secondary | ICD-10-CM | POA: Diagnosis present

## 2020-07-25 NOTE — Telephone Encounter (Signed)
Results of EMG discussed with patient.  He is aware of carpal tunnel in the right hand and uses a wrist brace nightly which provides relief.  He saw neurosurgery for lumbar canal stenosis and has been referred to PT and may get back injection as needed.  He is also supposed to call to get scheduled for MRI cervical spine.  Patient informed that his does not have neuropathy in the legs and paresthesias are stemming from his back.  Christian Eugenio K. Posey Pronto, DO

## 2020-07-25 NOTE — Procedures (Signed)
[facility] Neurology  [address], [place]  [place], [place] [address] Tel: [phone] Fax:  [phone] Test Date:  07/25/2020  Patient: [name] DOB: 1965/06/06 Physician: Narda Amber, DO  Sex: Male Height: 6\' 2"  Ref Phys: Narda Amber, DO  ID#: [id number]   Technician:    Patient Complaints: This is a 55 year old man referred for evaluation of arm and leg pain.  NCV & EMG Findings: Extensive electrodiagnostic testing of the right upper and lower extremity shows:  1. Right median sensory response shows prolonged latency (5.5 ms) and reduced amplitude (8.0 V). Right ulnar, sural, and superficial peroneal sensory responses are within normal limits.   2. Right median motor response shows prolonged latency (5.5 ms) and reduced amplitude (3.8 mV).  Right ulnar and tibial motor responses are within normal limits.  Right peroneal motor response is absent at the extensor digitorum brevis, and normal at the tibialis anterior. 3. Right tibial H reflex study shows prolonged latency. 4. In the right upper extremity, chronic motor axonal loss changes are seen affecting the abductor pollicis brevis, pronator teres, triceps, and deltoid muscles. 5. In the right lower extremity, chronic motor axonal changes are seen affecting the L5 myotome.  Without accompanying active denervation  Impression: 1. Chronic C5 and C7 radiculopathy affecting the right upper extremity, moderate. 2. Chronic L5 radiculopathy affecting the right lower extremity, moderate. 3. Right median neuropathy at or distal to the wrist (severe) consistent with a clinical diagnosis of carpal tunnel syndrome.  4. There is no evidence of a sensorimotor polyneuropathy affecting the right upper and lower extremities.   ___________________________ Narda Amber, DO    Nerve Conduction Studies Anti Sensory Summary Table   Stim Site NR Peak (ms) Norm Peak (ms) P-T Amp (V) Norm P-T Amp  Right Median Anti Sensory (2nd  Digit)  32C  Wrist    5.5 <3.6 8.0 >15  Right Sup Peroneal Anti Sensory (Ant Lat [name])  32C  12 cm    2.8 <4.6 7.8 >4  Right Sural Anti Sensory (Lat [name])  32C  Calf    2.8 <4.6 9.4 >4  Right Ulnar Anti Sensory (5th Digit)  32C  Wrist    2.8 <3.1 17.9 >10   Motor Summary Table   Stim Site NR Onset (ms) Norm Onset (ms) O-P Amp (mV) Norm O-P Amp Site1 Site2 Delta-0 (ms) Dist (cm) Vel (m/s) Norm Vel (m/s)  Right Median Motor (Abd Poll Brev)  32C  Wrist    5.5 <4.0 3.8 >6 Elbow Wrist 5.6 33.0 59 >50  Elbow    11.1  3.4         Right Peroneal Motor (Ext Dig Brev)  32C  Ankle NR  <6.0  >2.5 B Fib Ankle  0.0  >40  B Fib NR     Poplt B Fib  0.0  >40  Poplt NR            Right Peroneal TA Motor (Tib Ant)  32C  Fib Head    3.6 <4.5 3.7 >3 Poplit Fib Head 1.5 8.0 53 >40  Poplit    5.1  3.4         Right Tibial Motor (Abd Hall Brev)  32C  Ankle    3.7 <6.0 4.1 >4 Knee Ankle 11.3 48.0 42 >40  Knee    15.0  2.7         Right Ulnar Motor (Abd Dig Minimi)  32C  Wrist    2.9 <3.1  8.6 >7 B Elbow Wrist 4.6 25.0 54 >50  B Elbow    7.5  8.0  A Elbow B Elbow 2.0 10.0 50 >50  A Elbow    9.5  7.9          H Reflex Studies   NR H-Lat (ms) Lat Norm (ms) L-R H-Lat (ms)  Right Tibial (Gastroc)  32C     38.64 <35    EMG   Side Muscle Ins Act Fibs Psw Fasc Number Recrt Dur Dur. Amp Amp. Poly Poly. Comment  Right AntTibialis Nml Nml Nml Nml 1- Rapid Some 1+ Some 1+ Some 1+ N/A  Right Gastroc Nml Nml Nml Nml Nml Nml Nml Nml Nml Nml Nml Nml N/A  Right Flex Dig Long Nml Nml Nml Nml 1- Rapid Some 1+ Some 1+ Some 1+ N/A  Right RectFemoris Nml Nml Nml Nml Nml Nml Nml Nml Nml Nml Nml Nml N/A  Right GluteusMed Nml Nml Nml Nml 1- Rapid Some 1+ Some 1+ Some 1+ N/A  Right 1stDorInt Nml Nml Nml Nml Nml Nml Nml Nml Nml Nml Nml Nml N/A  Right Abd Poll Brev Nml Nml Nml Nml 2- Rapid Many 1+ Many 1+ Many 1+ N/A  Right PronatorTeres Nml Nml Nml Nml 1- Rapid Some 1+ Some 1+ Some 1+ N/A  Right Biceps Nml Nml  Nml Nml Nml Nml Nml Nml Nml Nml Nml Nml N/A  Right Triceps Nml Nml Nml Nml 1- Rapid Some 1+ Some 1+ Nml Nml N/A  Right Deltoid Nml Nml Nml Nml 1- Rapid Some 1+ Some 1+ Nml Nml N/A      Waveforms:

## 2020-07-26 ENCOUNTER — Other Ambulatory Visit: Payer: Self-pay | Admitting: Internal Medicine

## 2020-07-26 NOTE — Therapy (Addendum)
[place], [place], [address] Phone: [phone]   Fax:  [phone]  Physical Therapy Evaluation/Discharge  Patient Details  Name: [name] MRN: [id number] Date of Birth: [birth year] Referring Provider (PT): Vallarie Mare   Encounter Date: 07/25/2020   PT End of Session - 07/25/20 1822    Visit Number 1    Number of Visits 13    Date for PT Re-Evaluation 09/22/20    Authorization Type [employer]    PT Start Time 1801    PT Stop Time 1845    PT Time Calculation (min) 44 min    Activity Tolerance Patient tolerated treatment well    Behavior During Therapy [facility] for tasks assessed/performed           Past Medical History:  Diagnosis Date  . Diabetes mellitus   . Hyperlipemia   . Hypertension   . Obesity   . OSA (obstructive sleep apnea)    on CPAP    Past Surgical History:  Procedure Laterality Date  . NO PAST SURGERIES      There were no vitals filed for this visit.    Subjective Assessment - 07/25/20 1802    Subjective "I've got 3 degenerative discs on the left side and it's pushing on a nerve and I got two above that that are deteriorating too." Patient reports the pain started a few years ago, but has progressively worsened. Patient was referred to neurology and received nerve conduction velocity study, which determined that the pain is all in his back. Patient reports he also has a problem in his neck and is having an MRI for this at the end of the month and will f/u with neuro after this imaging. He received an injection about three weeks ago, which has made his pain more manageable. Patient went to chiropractor, but due to imaging was not advised to receive this care. Patient reports he can stand and walk longer since getting injection, but still has pain. Patient denies any pain in BLE since the injection, but before the injection it was painful in both of his calves. Patient reports  numbness in the Rt foot and was told that he doesn't have diabetic neuropathy. Patient denies any changes in bowel or bladder. Patient denies any saddle parasthesia. Patient reports he also needs a Rt knee replacement, but needs to lose weight in order to have the procedure and has been wearing a heel lift on the Rt side for a long time as he was told by a provider that he needed one.    Pertinent History Rt knee OA, recent lumbar injection    Limitations Standing;Walking    How long can you sit comfortably? I can sit forever    How long can you stand comfortably? 3-5 minutes then I get pain    How long can you walk comfortably? I can walk pretty good now (since the injection)    Diagnostic tests 1. Advanced and generalized lumbar spine degeneration exacerbated by  short pedicles and epidural fat.  2. Advanced and compressive thecal sac stenosis from L1-2 to L4-5.  3. Advanced foraminal impingement on the right at L5-S1 which may  have resulted in chronic structural changes to the right  extraforaminal L5 nerve root.  4. On the left is advanced foraminal impingement at L3-4 and L4-5.    Patient Stated Goals I want to get rid of the pain, might be able to build up my back muscle,  whatever I can to avoid surgery.    Currently in Pain? Yes    Pain Score 6     Pain Location Back    Pain Orientation Left    Pain Descriptors / Indicators Sharp    Pain Type Chronic pain    Pain Radiating Towards not since injection, previously pain in BLE    Pain Onset More than a month ago    Pain Frequency Constant    Aggravating Factors  work as a Dealer    Pain Relieving Factors sitting, resting              [facility] PT Assessment - 07/26/20 0001      Assessment   Medical Diagnosis Spinal stenosis, lumbar region with neurogenic claudication    Referring Provider (PT) Vallarie Mare    Onset Date/Surgical Date --   worsening over the past 2 years   Hand Dominance Left    Next MD Visit not scheduled yet     Prior Therapy no      Precautions   Precautions None      Restrictions   Weight Bearing Restrictions No      Balance Screen   Has the patient fallen in the past 6 months No      [place] residence    Living Arrangements Spouse/significant other;Children    Additional Comments steps to enter      Prior Function   Level of Independence Independent    Vocation Full time employment    Contractor, bending, crawling      Cognition   Overall Cognitive Status Within Functional Limits for tasks assessed      Observation/Other Assessments   Focus on Therapeutic Outcomes (FOTO)  59% function 64% predicted      Sensation   Light Touch Appears Intact      Coordination   Gross Motor Movements are Fluid and Coordinated Yes      Posture/Postural Control   Posture/Postural Control Postural limitations    Postural Limitations Rounded Shoulders;Forward head;Decreased lumbar lordosis      AROM   Lumbar Flexion fingertips 10 inch from floor   decreased pain   Lumbar Extension WNL    Lumbar - Right Side Bend WNL    Lumbar - Left Side Bend pain 4 inch from lateral joint line    Lumbar - Right Rotation WNL    Lumbar - Left Rotation WNL      Strength   Right Hip Flexion 4/5    Right Hip Extension 4+/5    Right Hip ABduction 4-/5    Left Hip Flexion 4-/5    Left Hip Extension 4/5    Left Hip ABduction 4/5      Flexibility   Soft Tissue Assessment /Muscle Length yes    Hamstrings WNL    Quadriceps PKB 90 deg bilaterally    Piriformis tight      Palpation   Spinal mobility normal L-spine mobility    Palpation comment TTP bilateral lumbar paraspinals      Special Tests   Other special tests (+) SLR bilaterally      Ambulation/Gait   Gait Comments decreased stance time RLE, lateral trunk lean (patient reports gait is affected by rt knee pain and has been wearing a heel lift on the Rt)                       Objective measurements completed  on examination: See above findings.       [facility] PT Treatment/Exercise - 07/26/20 0001      Self-Care   Self-Care Other Self-Care Comments    Other Self-Care Comments  see patient education                  PT Education - 07/25/20 1821    Education Details Education on current condition, POC, HEP.    Person(s) Educated Patient    Methods Explanation;Demonstration;Verbal cues;Handout    Comprehension Verbalized understanding;Returned demonstration;Verbal cues required            PT Short Term Goals - 07/26/20 0920      PT SHORT TERM GOAL #1   Title Patient will be independent with initial HEP.    Baseline Issued at eval    Time 2    Period Weeks    Status New    Target Date 08/09/20      PT SHORT TERM GOAL #2   Title Therapist will review FOTO and anticipated progress.    Baseline FOTO captured at eval    Time 2    Period Weeks    Status New    Target Date 08/09/20      PT SHORT TERM GOAL #3   Title Patient will improve prone knee bend by 10 degrees to signify improved quadricep flexibility to assist in reducing overall back pain.    Baseline 90 degrees bilaterally    Time 4    Period Weeks    Status New    Target Date 08/23/20             PT Long Term Goals - 07/26/20 1202      PT LONG TERM GOAL #1   Title Patient will demonstrate 5/5 hip extensor and abductor strength to improve stability about the chain for prolonged walking activity.    Baseline see flowsheet    Time 8    Period Weeks    Status New    Target Date 09/20/20      PT LONG TERM GOAL #2   Title Patient will tolerate at least 15 minutes of continous standing activity to improve overall tolerance to prolonged standing activites at work and at home.    Baseline 3-5 minutes    Time 8    Period Weeks    Status New    Target Date 09/20/20      PT LONG TERM GOAL #3   Title Patient will report a 50% reduction in pain  to improve tolerance to work specific tasks.    Baseline 6/10    Time 8    Period Weeks    Status New    Target Date 09/20/20      PT LONG TERM GOAL #4   Title Patient will score 64% function on FOTO to signify clinically meaningful improvement in functional abilities.    Baseline see flowsheet    Time 8    Period Weeks    Status New    Target Date 09/20/20      PT LONG TERM GOAL #5   Title Patient will demonstrate proper lifting and bending mechanics to reduce overall stress on low back with work specific activity.    Baseline increased pain with work specific activity    Time 8    Period Weeks    Status New    Target Date 09/20/20  Plan - 07/25/20 1900    Clinical Impression Statement Patient is a 55 y/o male with chief complaint of chronic low back pain that has progressively worsened over the past two years. He recently recieved an injection, which he reports has helped to make his pain more manageable with no radicular pain about BLEs since receiving the injection. Prior to the injection he was having bilateral calf pain, but reports the pain is now located along the Lt side of low back. He reports the pain is relieved with sitting and increases with standing/walking activity and with work duties as a Dealer. No time at evaluation to trial repeated movements to determine if patient has a directional preference, though he reports decrease in pain with active lumbar flexion during AROM assessment. He is noted to have limited trunk mobility, significant quadricep tightness bilaterally, hip/core weakness, gait abnormalities, and postural dysfunction. He has multiple co-morbidities that could impact his care including Rt knee OA, cervical pain, and obesity. He will benefit from skilled PT to address above stated deficits in order to return to optimal function.    Personal Factors and Comorbidities Age;Fitness;Time since onset of  injury/illness/exacerbation;Profession;Comorbidity 3+    Comorbidities Rt knee OA, hypertension, obesity    Examination-Activity Limitations Stand;Locomotion Level    Examination-Participation Restrictions Occupation    Stability/Clinical Decision Making Evolving/Moderate complexity    Clinical Decision Making Moderate    Rehab Potential Fair    PT Frequency --   2x week for 4 weeks then potentially dropping to 1x/week   PT Duration 8 weeks    PT Treatment/Interventions ADLs/Self Care Home Management;Cryotherapy;Moist Heat;Traction;Gait training;Therapeutic activities;Therapeutic exercise;Balance training;Neuromuscular re-education;Patient/family education;Manual techniques;Passive range of motion;Dry needling;Taping    PT Next Visit Plan assess pelvic symmetry/LLD as patient reports long term use of Rt heel lift. begin core stabilization, hip strengthening, quad stretching. directional preference. review FOTO. review and update HEP    PT Home Exercise Plan Access Code: [email or web address]    Consulted and Agree with Plan of Care Patient           Patient will benefit from skilled therapeutic intervention in order to improve the following deficits and impairments:  Abnormal gait,Difficulty walking,Decreased range of motion,Decreased activity tolerance,Pain,Impaired flexibility,Postural dysfunction,Decreased strength  Visit Diagnosis: Chronic left-sided low back pain, unspecified whether sciatica present  Muscle weakness (generalized)  Difficulty in walking, not elsewhere classified     Problem List Patient Active Problem List   Diagnosis Date Noted  . Unilateral primary osteoarthritis, right knee 10/28/2019  . PCP NOTES >>>>>>>> 07/09/2015  . DJD (degenerative joint disease) 10/27/2014  . Screening for heart disease 06/28/2012  . OSA (obstructive sleep apnea) 06/14/2012  . ED (erectile dysfunction) 04/26/2012  . Annual physical exam 12/29/2011  . Achilles tendinitis 09/10/2010  .  Allergic rhinitis 07/12/2010  . Hyperlipidemia 04/26/2007  . DM II (diabetes mellitus, type II), controlled ([place]) 04/22/2006  . OBESITY NOS 04/22/2006  . Essential hypertension 04/22/2006   Gwendolyn Grant, PT, DPT, ATC 07/26/20 1:36 PM PHYSICAL THERAPY DISCHARGE SUMMARY  Visits from Start of Care: 1  Current functional level related to goals / functional outcomes: No formal re-assessment of goals as patient did no return after initial evaluation.     Remaining deficits: Status unknown   Education / Equipment: See above  Plan: Patient agrees to discharge.  Patient goals were not met. Patient is being discharged due to the patient's request. Patient called to cancel all scheduled visits stating he did not need PT at this time.  ?????  Gwendolyn Grant, PT, DPT, ATC 08/21/20 2:28 PM  [facility] [address] [place], [place], [address] Phone: [phone]   Fax:  [phone]  Name: [name] MRN: [id number] Date of Birth: 01-15-1966

## 2020-07-31 ENCOUNTER — Telehealth: Payer: Self-pay | Admitting: Pulmonary Disease

## 2020-07-31 DIAGNOSIS — G4733 Obstructive sleep apnea (adult) (pediatric): Secondary | ICD-10-CM | POA: Diagnosis not present

## 2020-07-31 NOTE — Telephone Encounter (Signed)
Call patient  Sleep study result  Date of study: 07/24/2020  Impression: Severe obstructive sleep apnea Mild oxygen desaturations  Recommendation: DME referral  Recommend CPAP therapy for severe obstructive sleep apnea  Auto titrating CPAP with pressure settings of 5- 20 will be appropriate  Encourage weight loss measures  Follow-up in the office 4 to 6 weeks following initiation of treatment

## 2020-08-01 NOTE — Telephone Encounter (Signed)
I tried to call patient to go over HST. No answer, left VM

## 2020-08-02 ENCOUNTER — Telehealth: Payer: Self-pay | Admitting: Pulmonary Disease

## 2020-08-02 NOTE — Telephone Encounter (Signed)
Sleep study result  Date of study: 07/24/2020  Impression: Severe obstructive sleep apnea Mild oxygen desaturations  Recommendation: DME referral  Recommend CPAP therapy for severe obstructive sleep apnea  Auto titrating CPAP with pressure settings of 5- 20 will be appropriate  Encourage weight loss measures  Follow-up in the office 4 to 6 weeks following initiation of treatment   I have called and LM on VM for the pt to call back about his results.

## 2020-08-02 NOTE — Telephone Encounter (Signed)
I tried to call patient about HST. There was no answer, left VM. This was 2nd attempt to reach patient, sent letter and will close encounter.

## 2020-08-03 ENCOUNTER — Telehealth: Payer: Self-pay | Admitting: Pulmonary Disease

## 2020-08-03 ENCOUNTER — Other Ambulatory Visit: Payer: Self-pay | Admitting: Neurology

## 2020-08-03 DIAGNOSIS — G4733 Obstructive sleep apnea (adult) (pediatric): Secondary | ICD-10-CM

## 2020-08-03 NOTE — Telephone Encounter (Signed)
Rx for pt's cpap has been placed. Nothing further needed.

## 2020-08-03 NOTE — Telephone Encounter (Signed)
Yes that is fine  Thanks   

## 2020-08-03 NOTE — Telephone Encounter (Signed)
Sleep study result  Date of study: 07/24/2020  Impression: Severe obstructive sleep apnea Mild oxygen desaturations  Recommendation: DME referral  Recommend CPAP therapy for severe obstructive sleep apnea  Auto titrating CPAP with pressure settings of 5- 20 will be appropriate  Encourage weight loss measures  Follow-up in the office 4 to 6 weeks following initiation of treatment   Pt returned call. I stated to pt the results of the sleep study and he verbalized understanding. Pt stated that he is already established with DME [facility] but his current machine is 75-86 years old. Stated to him that we will place Rx for him to receive a new machine from [facility] and he verbalized understanding. Pt is aware to call the office once he has received the new machine to schedule f/u appt with office.  Due to Dr. Jenetta Downer being out of the office, Beth, please advise if you are okay with [name] placing Rx under you. Rx has been pended for pt's new cpap.

## 2020-08-03 NOTE — Telephone Encounter (Signed)
Attempted to call pt but unable to reach left message for him to return call. Due to multiple attempts trying to reach pt with unable to do so, per triage protocol encounter will be closed.

## 2020-08-03 NOTE — Telephone Encounter (Signed)
Lm for patient . Please see 08/02/2020 phone note for HST results.

## 2020-08-11 ENCOUNTER — Ambulatory Visit
Admission: RE | Admit: 2020-08-11 | Discharge: 2020-08-11 | Disposition: A | Discharge status: Home / Self Care | Payer: 59 | Source: Ambulatory Visit | Attending: Neurosurgery | Admitting: Neurosurgery

## 2020-08-11 DIAGNOSIS — M48062 Spinal stenosis, lumbar region with neurogenic claudication: Secondary | ICD-10-CM

## 2020-08-11 IMAGING — MR MR CERVICAL SPINE W/O CM
4 of 5 series · 26 of 48 positions shown · non-contrast
Comparison: None.

CLINICAL DATA: Initial evaluation for chronic neck pain without
radiculopathy.

EXAM:
MRI CERVICAL SPINE WITHOUT CONTRAST
TECHNIQUE: Multiplanar, multisequence MR imaging of the cervical spine was
performed. No intravenous contrast was administered.

[Series 5: T2 · sagittal · 3.0mm · 0.55mm/px · 6 of 15 slices shown (1 of 2)]
[im 1/15]
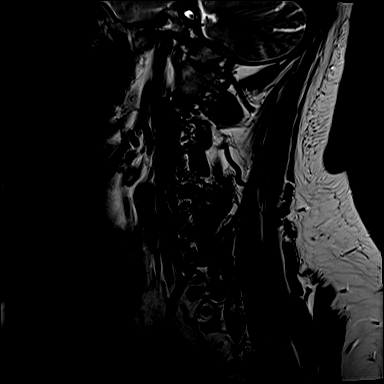
[im 3/15]
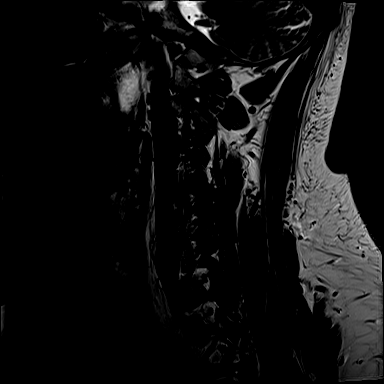
[im 6/15]
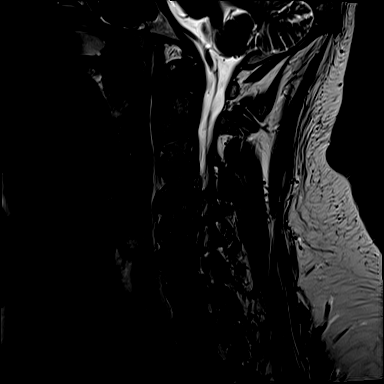
[im 9/15]
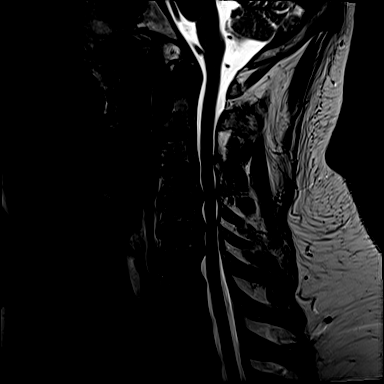
[im 12/15]
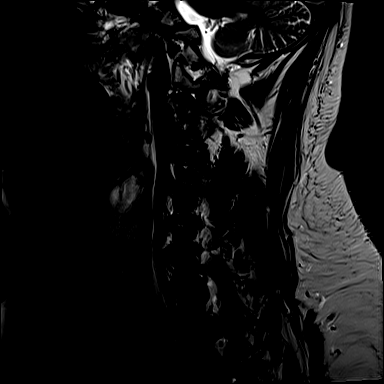
[im 15/15]
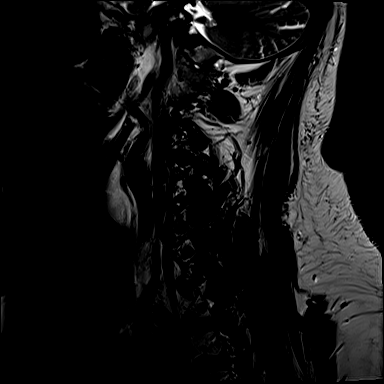

[Series 6: T1 · sagittal · 3.0mm · 0.66mm/px · 7 of 15 slices shown]
[im 1/15]
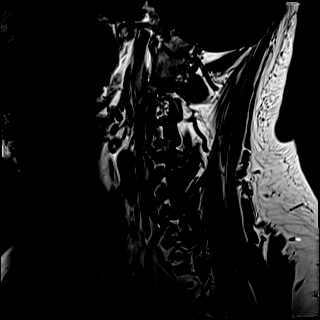
[im 3/15]
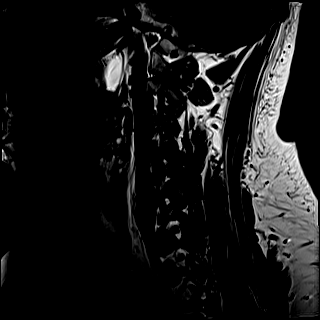
[im 5/15]
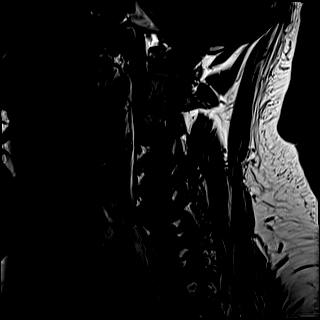
[im 8/15]
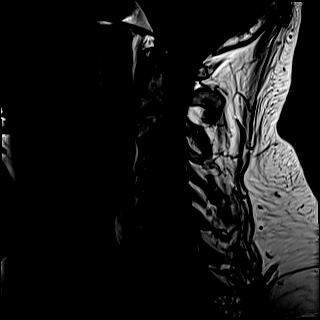
[im 10/15]
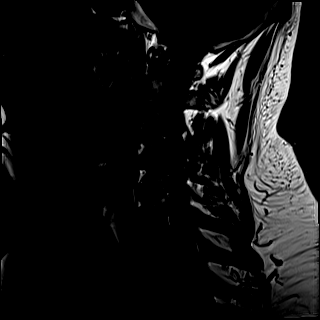
[im 12/15]
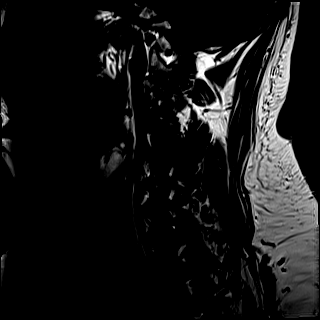
[im 15/15]
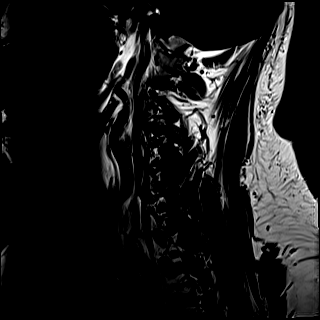

[Series 7: STIR · sagittal · 3.0mm · 0.33mm/px · 5 of 15 slices shown]
[im 1/15]
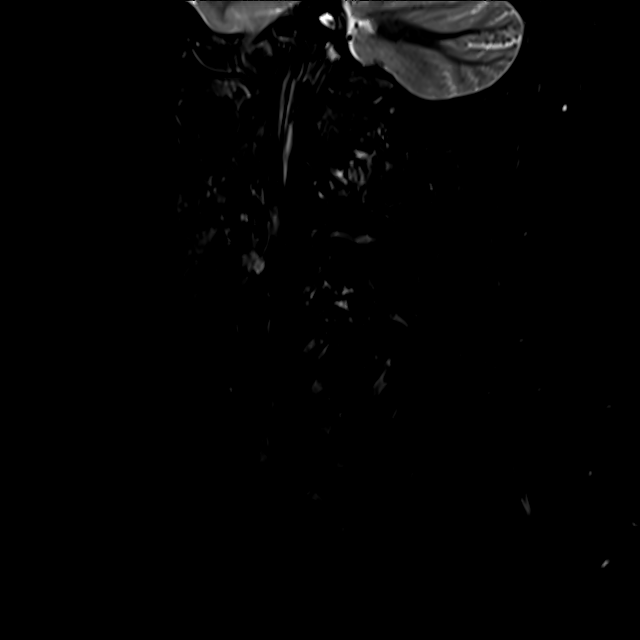
[im 3/15]
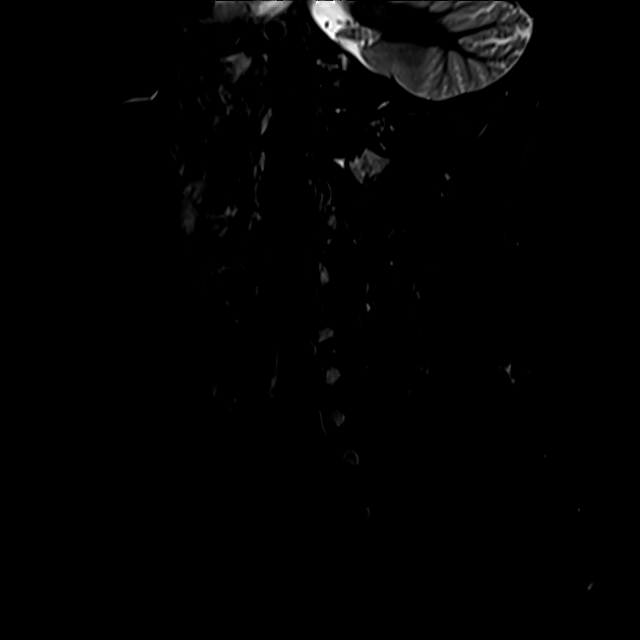
[im 5/15]
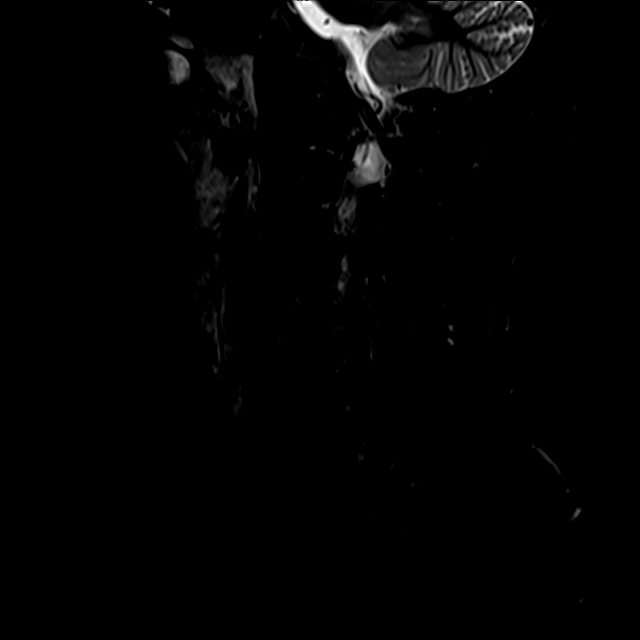
[im 8/15]
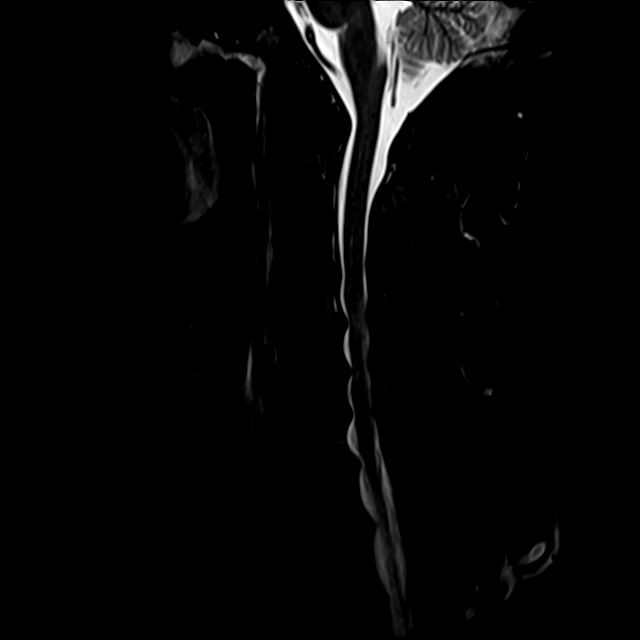
[im 12/15]
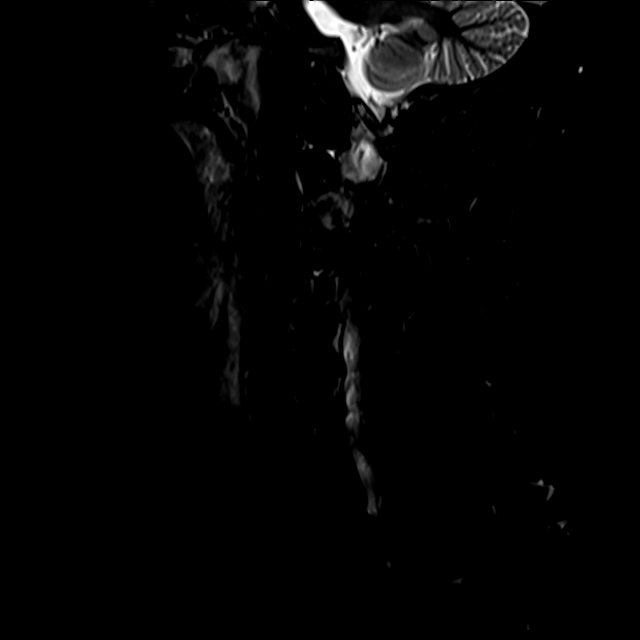

[Series 8: T2 · axial · 3.0mm · 0.50mm/px · z∈[-77,+25]mm · 8 of 32 slices shown (2 of 2)]
[im 1/32]
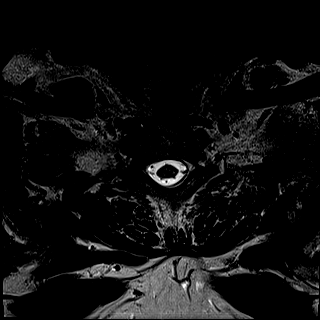
[im 5/32]
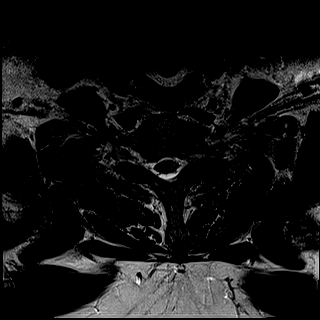
[im 10/32]
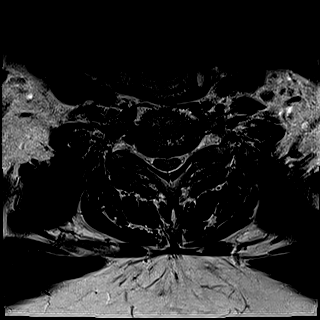
[im 15/32]
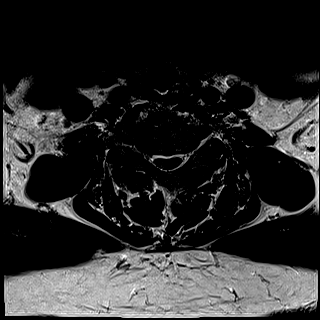
[im 17/32]
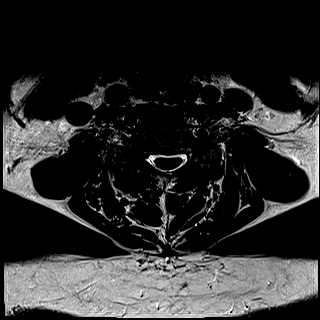
[im 22/32]
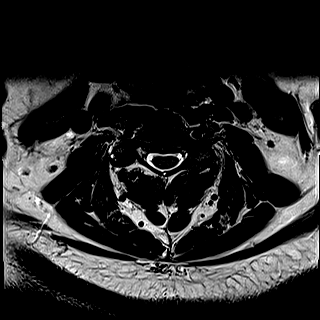
[im 27/32]
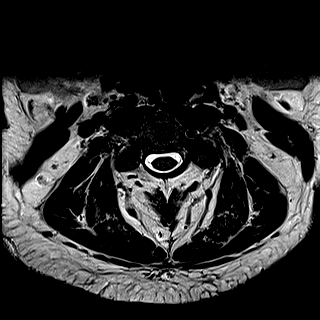
[im 32/32]
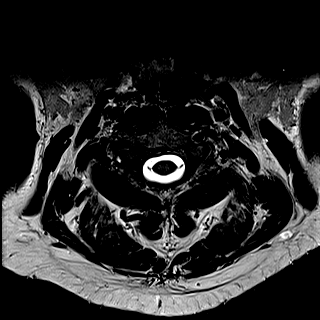

[26 of 48 positions shown; findings below may reference images not displayed]

FINDINGS: Alignment: Straightening of the normal cervical lordosis. Trace
anterolisthesis of C3 on C4 and C7 on T1, chronic and degenerative.

Vertebrae: Vertebral body height maintained without acute or chronic
fracture. Bone marrow signal intensity within normal limits. No
worrisome osseous lesions. Prominent reactive endplate changes noted
about the C5-6 and C6-7 interspaces. No abnormal marrow edema.

Cord: Subtle patchy signal abnormality seen within the left hemi
cord at the level of C4-5 (series 7, image 10). Additional probable
small foci of signal abnormality within the dorsal cord at the level
of C5-6 (series 7, image 10). Findings suspicious for chronic
myelomalacia. Signal intensity within the cervical spinal cord
otherwise within normal limits.

Posterior Fossa, vertebral arteries, paraspinal tissues: Visualized
brain and posterior fossa within normal limits. Craniocervical
junction within normal limits. Paraspinous and prevertebral soft
tissues are normal. Normal flow voids seen within the vertebral
arteries bilaterally.

Disc levels:

C2-C3: Negative interspace. Mild right with moderate left facet
hypertrophy. No spinal stenosis. Foramina remain patent.

C3-C4: Trace anterolisthesis. Broad-based posterior disc bulge,
slightly asymmetric to the left, flattens and partially faces the
ventral thecal sac. Superimposed severe left with mild right facet
degeneration. Resultant mild spinal stenosis with minimal cord
flattening. Severe left with moderate right C4 foraminal stenosis.

C4-C5: Degenerative intervertebral disc space narrowing with diffuse
disc osteophyte complex. Broad posterior component flattens and
effaces the ventral thecal sac, slightly eccentric to the right.
Superimposed severe right with mild to moderate left facet
arthrosis. Resultant moderate spinal stenosis with mild cord
flattening. Suspected subtle cord signal changes within the left
hemi cord at this level. Severe right worse than left C5 foraminal
stenosis.

C5-C6: Degenerative intervertebral disc space narrowing with diffuse
disc osteophyte complex. Broad posterior component flattens and
indents the ventral thecal sac. Moderate spinal stenosis with mild
cord flattening. Subtle foci of cord signal abnormality within the
dorsal cord suspicious for myelomalacia (series 8, image 20). Severe
left worse than right C6 foraminal narrowing.

C6-C7: Degenerative intervertebral disc space narrowing with diffuse
disc osteophyte complex. Flattening and effacement of the ventral
thecal sac with resultant mild spinal stenosis. Mild flattening of
the ventral cord without cord signal changes. Moderate left worse
than right C7 foraminal narrowing.

C7-T1: Mild disc bulge. Moderate left-sided facet hypertrophy. No
spinal stenosis. Mild left C8 foraminal stenosis. Right neural
foramina remains patent.

Visualized upper thoracic spine demonstrates mild noncompressive
disc bulging at T2-3 without significant stenosis.
IMPRESSION: 1. Multilevel cervical spondylosis with resultant mild to moderate
diffuse spinal stenosis at C3-4 through C6-7, most pronounced at
C4-5.
2. Subtle foci of cord signal abnormality within the cord at the
levels of C4-5 and C5-6, suspicious for myelomalacia.
3. Multifactorial degenerative changes with resultant multilevel
foraminal narrowing as above. Notable findings include severe left
with moderate right C4 foraminal stenosis, severe bilateral C5 and
C6 foraminal narrowing, with moderate left worse than right C7
foraminal stenosis.

## 2020-08-22 ENCOUNTER — Other Ambulatory Visit: Payer: Self-pay | Admitting: Neurology

## 2020-08-27 ENCOUNTER — Other Ambulatory Visit: Payer: Self-pay | Admitting: Internal Medicine

## 2020-08-30 ENCOUNTER — Other Ambulatory Visit: Payer: Self-pay | Admitting: Internal Medicine

## 2020-09-08 ENCOUNTER — Other Ambulatory Visit: Payer: Self-pay

## 2020-09-08 ENCOUNTER — Emergency Department (HOSPITAL_BASED_OUTPATIENT_CLINIC_OR_DEPARTMENT_OTHER)
Admission: EM | Admit: 2020-09-08 | Discharge: 2020-09-09 | Disposition: A | Discharge status: Home / Self Care | Payer: 59 | Attending: Emergency Medicine | Admitting: Emergency Medicine

## 2020-09-08 ENCOUNTER — Encounter (HOSPITAL_BASED_OUTPATIENT_CLINIC_OR_DEPARTMENT_OTHER): Payer: Self-pay | Admitting: *Deleted

## 2020-09-08 DIAGNOSIS — I10 Essential (primary) hypertension: Secondary | ICD-10-CM | POA: Diagnosis not present

## 2020-09-08 DIAGNOSIS — M545 Low back pain, unspecified: Secondary | ICD-10-CM | POA: Diagnosis present

## 2020-09-08 DIAGNOSIS — M5442 Lumbago with sciatica, left side: Secondary | ICD-10-CM | POA: Diagnosis not present

## 2020-09-08 DIAGNOSIS — Z7982 Long term (current) use of aspirin: Secondary | ICD-10-CM | POA: Diagnosis not present

## 2020-09-08 DIAGNOSIS — E119 Type 2 diabetes mellitus without complications: Secondary | ICD-10-CM | POA: Insufficient documentation

## 2020-09-08 DIAGNOSIS — M5441 Lumbago with sciatica, right side: Secondary | ICD-10-CM | POA: Diagnosis not present

## 2020-09-08 DIAGNOSIS — Z79899 Other long term (current) drug therapy: Secondary | ICD-10-CM | POA: Insufficient documentation

## 2020-09-08 DIAGNOSIS — Z7984 Long term (current) use of oral hypoglycemic drugs: Secondary | ICD-10-CM | POA: Insufficient documentation

## 2020-09-08 HISTORY — DX: Other intervertebral disc degeneration, lumbar region without mention of lumbar back pain or lower extremity pain: M51.369

## 2020-09-08 HISTORY — DX: Other intervertebral disc degeneration, lumbar region: M51.36

## 2020-09-08 MED ORDER — NAPROXEN 250 MG PO TABS
500.0000 mg | ORAL_TABLET | Freq: Once | ORAL | Status: AC
  Administered 2020-09-08: 500 mg via ORAL
  Filled 2020-09-08: qty 2

## 2020-09-08 MED ORDER — OXYCODONE-ACETAMINOPHEN 5-325 MG PO TABS
2.0000 | ORAL_TABLET | Freq: Once | ORAL | Status: AC
Start: 2020-09-09 — End: 2020-09-08
  Administered 2020-09-08: 2 via ORAL
  Filled 2020-09-08: qty 2

## 2020-09-08 MED ORDER — OXYCODONE-ACETAMINOPHEN 10-325 MG PO TABS: 1.0000 | ORAL_TABLET | Freq: Four times a day (QID) | ORAL | 0 refills | Status: DC | PRN

## 2020-09-08 NOTE — ED Notes (Signed)
EDP at bedside  

## 2020-09-08 NOTE — ED Triage Notes (Signed)
Pt has Hx of DDD. He had epidural injection in March which is now wearing off. C/o increasing pain and difficulty finding a position of comfort

## 2020-09-08 NOTE — ED Provider Notes (Signed)
[facility] DEPT [facility] Provider Note: Georgena Spurling, MD, FACEP  CSN: [id number] MRN: [id number] ARRIVAL: 09/08/20 at 2253 ROOM: MH07/MH07   CHIEF COMPLAINT  Back Pain   HISTORY OF PRESENT ILLNESS  09/08/20 11:36 PM [name] is a 55 y.o. male history of back pain due to degenerative disc disease.  He had an epidural injection in March which gave him temporary relief but the relief has abated over the last few days and he is now having severe pain in his lower back.  The pain in his lower lumbar region going all the way across from left to right.  It radiates down the back of both legs.  It is worse with movement.  He denies any new numbness, weakness or change in bowel or bladder function.  He was unable to get into see his neurosurgeon, Dr. Marcello Moores.   Past Medical History:  Diagnosis Date  . DDD (degenerative disc disease), lumbar   . Diabetes mellitus   . Hyperlipemia   . Hypertension   . Obesity   . OSA (obstructive sleep apnea)    on CPAP    Past Surgical History:  Procedure Laterality Date  . NO PAST SURGERIES      Family History  Problem Relation Age of Onset  . Diabetes Mother   . Hypertension Neg Hx   . Coronary artery disease Neg Hx   . Colon cancer Neg Hx   . Prostate cancer Neg Hx     Social History   Tobacco Use  . Smoking status: Never Smoker  . Smokeless tobacco: Never Used  Vaping Use  . Vaping Use: Never used  Substance Use Topics  . Alcohol use: No  . Drug use: No    Prior to Admission medications   Medication Sig Start Date End Date Taking? Authorizing Provider  oxyCODONE-acetaminophen (PERCOCET) 10-325 MG tablet Take 1 tablet by mouth every 6 (six) hours as needed for pain. 09/08/20  Yes Elleanor Guyett, MD  tadalafil (CIALIS) 20 MG tablet TAKE ONE-HALF TO ONE (0.5-1) TABLET BY MOUTH DAILY AS NEEDED FOR ERECTILE DYSFUNCTION 07/30/20   Colon Branch, MD  aspirin 81 MG tablet Take 81 mg by mouth daily.      [provider]  blood  glucose meter kit and supplies Use up to four times daily as directed. Dx: E11.9 02/10/20   Colon Branch, MD  Blood Glucose Monitoring Suppl ([place]) w/Device KIT Test blood sugar twice a day.  Dx code: E11.9 06/23/19   Colon Branch, MD  etodolac (LODINE) 500 MG tablet Take 1 tablet (500 mg total) by mouth 2 (two) times daily as needed. 08/30/20   Colon Branch, MD  fluticasone [facility]) 50 MCG/ACT nasal spray Place 2 sprays into both nostrils daily. 09/22/18   Colon Branch, MD  gabapentin (NEURONTIN) 300 MG capsule TAKE 2 CAPSULES BY MOUTH 3 TIMES DAILY. 08/23/20   Narda Amber K, DO  losartan-hydrochlorothiazide (HYZAAR) 100-12.5 MG tablet TAKE 1 TABLET BY MOUTH EVERY DAY 03/14/20   Colon Branch, MD  metFORMIN (GLUCOPHAGE) 1000 MG tablet Take 1 tablet (1,000 mg total) by mouth 2 (two) times daily with a meal. 08/27/20   Colon Branch, MD  OneTouch Delica Lancets 49C MISC Test blood sugar twice a day.  Dx code: E11.9 06/23/19   Colon Branch, MD  [facility] VERIO test strip TEST BLOOD SUGAR TWICE A DAY. DX CODE: E11.9 09/29/19   Colon Branch, MD  Vitamin D, Ergocalciferol, (DRISDOL) 1.25 MG (50000 UNIT) CAPS capsule Take 1 capsule (50,000 Units total) by mouth every 7 (seven) days. 03/14/20   Colon Branch, MD    Allergies Penicillins   REVIEW OF SYSTEMS  Negative except as noted here or in the History of Present Illness.   PHYSICAL EXAMINATION  Initial Vital Signs Blood pressure (!) 153/107, pulse 92, temperature 98.2 F (36.8 C), temperature source Oral, resp. rate 20, height 6' 2" (1.88 m), weight (!) 143.3 kg, SpO2 99 %.  Examination General: Well-developed, well-nourished male in no acute distress; appearance consistent with age of record HENT: normocephalic; atraumatic Eyes: pupils equal, round and reactive to light; extraocular muscles intact Neck: supple Heart: regular rate and rhythm Lungs: clear to auscultation bilaterally Abdomen: soft; nondistended; nontender; bowel sounds  present Back: No lumbar tenderness; minimal pain on movement of lower back; no pain on straight leg raise bilaterally but pain on lowering of legs bilaterally Extremities: No deformity; full range of motion; pulses normal Neurologic: Awake, alert and oriented; motor function intact in all extremities and symmetric; no facial droop; sensation intact in lower extremities and symmetric; no saddle anesthesia Skin: Warm and dry Psychiatric: Normal mood and affect   RESULTS  Summary of this visit's results, reviewed and interpreted by myself:   EKG Interpretation  Date/Time:    Ventricular Rate:    PR Interval:    QRS Duration:   QT Interval:    QTC Calculation:   R Axis:     Text Interpretation:        Laboratory Studies: No results found for this or any previous visit (from the past 24 hour(s)). Imaging Studies: No results found.  ED COURSE and MDM  Nursing notes, initial and subsequent vitals signs, including pulse oximetry, reviewed and interpreted by myself.  Vitals:   09/08/20 2312 09/08/20 2314  BP:  (!) 153/107  Pulse:  92  Resp:  20  Temp:  98.2 F (36.8 C)  TempSrc:  Oral  SpO2:  99%  Weight: (!) 143.3 kg   Height: 6' 2" (1.88 m)    Medications  oxyCODONE-acetaminophen (PERCOCET/ROXICET) 5-325 MG per tablet 2 tablet (has no administration in time range)  naproxen (NAPROSYN) tablet 500 mg (has no administration in time range)    We will treat patient with a brief course of narcotic pain medicine pending follow-up with Dr. Marcello Moores.  PROCEDURES  Procedures   ED DIAGNOSES     ICD-10-CM   1. Acute midline low back pain with bilateral sciatica  M54.42    M54.41        Errin Whitelaw, Jenny Reichmann, MD 09/08/20 [phone]

## 2020-09-08 NOTE — ED Notes (Signed)
See EDP assessment; pt ambulatory at discharge

## 2020-09-11 ENCOUNTER — Encounter: Payer: Self-pay | Admitting: Internal Medicine

## 2020-09-11 NOTE — Telephone Encounter (Signed)
Called pt to offer a sooner apt with another provider, he declined and will wait until Friday to see Dr. [name].  -JMA

## 2020-09-14 ENCOUNTER — Ambulatory Visit: Payer: 59 | Admitting: Internal Medicine

## 2020-09-14 ENCOUNTER — Other Ambulatory Visit: Payer: Self-pay

## 2020-09-14 ENCOUNTER — Encounter: Payer: Self-pay | Admitting: Internal Medicine

## 2020-09-14 ENCOUNTER — Ambulatory Visit: Payer: 59 | Attending: Internal Medicine

## 2020-09-14 VITALS — BP 132/80 | HR 83 | Temp 97.6°F | Resp 16 | Ht 74.0 in | Wt 320.0 lb

## 2020-09-14 DIAGNOSIS — E785 Hyperlipidemia, unspecified: Secondary | ICD-10-CM | POA: Diagnosis not present

## 2020-09-14 DIAGNOSIS — E1140 Type 2 diabetes mellitus with diabetic neuropathy, unspecified: Secondary | ICD-10-CM

## 2020-09-14 DIAGNOSIS — Z23 Encounter for immunization: Secondary | ICD-10-CM

## 2020-09-14 DIAGNOSIS — I10 Essential (primary) hypertension: Secondary | ICD-10-CM | POA: Diagnosis not present

## 2020-09-14 DIAGNOSIS — Z125 Encounter for screening for malignant neoplasm of prostate: Secondary | ICD-10-CM | POA: Diagnosis not present

## 2020-09-14 DIAGNOSIS — M159 Polyosteoarthritis, unspecified: Secondary | ICD-10-CM

## 2020-09-14 DIAGNOSIS — M8949 Other hypertrophic osteoarthropathy, multiple sites: Secondary | ICD-10-CM

## 2020-09-14 DIAGNOSIS — M5441 Lumbago with sciatica, right side: Secondary | ICD-10-CM

## 2020-09-14 LAB — HEMOGLOBIN A1C: Hgb A1c MFr Bld: 7.1 % — ABNORMAL HIGH (ref 4.6–6.5)

## 2020-09-14 LAB — PSA: PSA: 1.79 ng/mL (ref 0.10–4.00)

## 2020-09-14 NOTE — Progress Notes (Signed)
Subjective:    Patient ID: [name], male    DOB: 1965/09/27, 55 y.o.   MRN: [id number]  DOS:  09/14/2020 Type of visit - description: Routine follow-up  Today we talk about back pain which is a major issue for the patient. He actually went to the ER few days ago, got some oxycodone, subsequently was seen by a "tele- doctor", was prescribed a muscle relaxant. Saw neurology, chart reviewed.  Reports good medication compliance, ambulatory CBGs and BPs are okay.  Review of Systems See above   Past Medical History:  Diagnosis Date  . DDD (degenerative disc disease), lumbar   . Diabetes mellitus   . Hyperlipemia   . Hypertension   . Obesity   . OSA (obstructive sleep apnea)    on CPAP    Past Surgical History:  Procedure Laterality Date  . NO PAST SURGERIES      Allergies as of 09/14/2020      Reactions   Penicillins    REACTION: unspecified      Medication List       Accurate as of September 14, 2020  8:33 AM. If you have any questions, ask your nurse or doctor.        aspirin 81 MG tablet Take 81 mg by mouth daily.   blood glucose meter kit and supplies Use up to four times daily as directed. Dx: E11.9   etodolac 500 MG tablet Commonly known as: LODINE Take 1 tablet (500 mg total) by mouth 2 (two) times daily as needed.   fluticasone 50 MCG/ACT nasal spray Commonly known as: FLONASE Place 2 sprays into both nostrils daily.   gabapentin 300 MG capsule Commonly known as: NEURONTIN TAKE 2 CAPSULES BY MOUTH 3 TIMES DAILY.   losartan-hydrochlorothiazide 100-12.5 MG tablet Commonly known as: HYZAAR TAKE 1 TABLET BY MOUTH EVERY DAY   metFORMIN 1000 MG tablet Commonly known as: GLUCOPHAGE Take 1 tablet (1,000 mg total) by mouth 2 (two) times daily with a meal.   OneTouch Delica Lancets 16W Misc Test blood sugar twice a day.  Dx code: E11.9   OneTouch Verio Flex System w/Device Kit Test blood sugar twice a day.  Dx code: E11.9   OneTouch Verio test  strip Generic drug: glucose blood TEST BLOOD SUGAR TWICE A DAY. DX CODE: E11.9   oxyCODONE-acetaminophen 10-325 MG tablet Commonly known as: Percocet Take 1 tablet by mouth every 6 (six) hours as needed for pain.   tadalafil 20 MG tablet Commonly known as: CIALIS TAKE ONE-HALF TO ONE (0.5-1) TABLET BY MOUTH DAILY AS NEEDED FOR ERECTILE DYSFUNCTION   tiZANidine 4 MG capsule Commonly known as: ZANAFLEX Take 4 mg by mouth 3 (three) times daily.   Vitamin D (Ergocalciferol) 1.25 MG (50000 UNIT) Caps capsule Commonly known as: DRISDOL Take 1 capsule (50,000 Units total) by mouth every 7 (seven) days.          Objective:   Physical Exam BP 132/80 (BP Location: Right Arm, Patient Position: Sitting, Cuff Size: Normal)   Pulse 83   Temp 97.6 F (36.4 C) (Oral)   Resp 16   Ht '6\' 2"'  (1.88 m)   Wt (!) 320 lb (145.2 kg)   SpO2 97%   BMI 41.09 kg/m  General:   Well developed, NAD, BMI noted. HEENT:  Normocephalic . Face symmetric, atraumatic Lungs:  CTA B Normal respiratory effort, no intercostal retractions, no accessory muscle use. Heart: RRR,  no murmur.  Lower extremities: no pretibial edema bilaterally  Skin: Not pale. Not jaundice Neurologic:  alert & oriented X3.  Speech normal, gait-posture somewhat antalgic due to back pain Psych--  Cognition and judgment appear intact.  Cooperative with normal attention span and concentration.  Behavior appropriate. No anxious or depressed appearing.      Assessment    Assessment DM NO neuropathy per NCS 07-2020.  SX related to back DJD HTN Hyperlipidemia MSK: -DJD Knees , back (severe per MRI, + radiation B legs) -Chronic neck pain Morbid obesity OSA( severe per repeated study April 2022), on CPAP Diastases recti   PLAN: DM: Continue metformin, check A1c HTN: Well-controlled, continue Hyzaar, check a CMP Dyslipidemia: Check FLP, aware that he might need medication. DJD  (Neck, back-severe, knees): Saw neurology  06/11/2020 for myalgias, blood work was unremarkable, MRI of the lumbar spine show severe DJD, nerve compression at multiple levels, findings felt to account  for his leg symptoms. NCS was done, showing: Chronic radiculopathy C5, C7, L5, also R CTS.  NO polyneuropathy Was rec to see neurosurgery, Dr. Duffy Rhody, got a local injection which helped temporarily, we will see him soon again, surgery?. At this point he is taking etodolac, ER Rx a small mount of oxycodone, has a muscle relaxant. He is also on gabapentin which seems to help. Requested a referral to a physiatrist, will arrange. OSA: Repeated sleep study in order to get new equipment shows severe OSA.  Good CPAP compliance. Preventive care: Check a PSA (is overdue) Rec COVID-19 #4 vaccination. RTC 4 months CPX  This visit occurred during the SARS-CoV-2 public health emergency.  Safety protocols were in place, including screening questions prior to the visit, additional usage of staff PPE, and extensive cleaning of exam room while observing appropriate contact time as indicated for disinfecting solutions.

## 2020-09-14 NOTE — Patient Instructions (Addendum)
Continue checking your blood pressure and blood sugars  Please proceed with your COVID-vaccine #4 at the first floor.  GO TO THE LAB : Get the blood work     GO TO THE FRONT DESK, [place] Come back for   a physical exam in 4 months, fasting

## 2020-09-14 NOTE — Progress Notes (Signed)
   Covid-19 Vaccination Clinic  Name:  LIBAN GUEDES    MRN: [id number] DOB: 14-Dec-1965  09/14/2020  Mr. Moravek was observed post Covid-19 immunization for 15 minutes without incident. He was provided with Vaccine Information Sheet and instruction to access the V-Safe system.   Mr. Narine was instructed to call 911 with any severe reactions post vaccine: Marland Kitchen Difficulty breathing  . Swelling of face and throat  . A fast heartbeat  . A bad rash all over body  . Dizziness and weakness   Immunizations Administered    Name Date Dose VIS Date Route   PFIZER Comrnaty(Gray TOP) Covid-19 Vaccine 09/14/2020  9:15 AM 0.3 mL 03/22/2020 Intramuscular   Manufacturer: [place]   Lot: [id number]   [place]: [phone]

## 2020-09-15 NOTE — Assessment & Plan Note (Signed)
DM: Continue metformin, check A1c HTN: Well-controlled, continue Hyzaar, check a CMP Dyslipidemia: Check FLP, aware that he might need medication. DJD  (Neck, back-severe, knees): Saw neurology 06/11/2020 for myalgias, blood work was unremarkable, MRI of the lumbar spine show severe DJD, nerve compression at multiple levels, findings felt to account  for his leg symptoms. NCS was done, showing: Chronic radiculopathy C5, C7, L5, also R CTS.  NO polyneuropathy Was rec to see neurosurgery, Dr. Duffy Rhody, got a local injection which helped temporarily, we will see him soon again, surgery?. At this point he is taking etodolac, ER Rx a small mount of oxycodone, has a muscle relaxant. He is also on gabapentin which seems to help. Requested a referral to a physiatrist, will arrange. OSA: Repeated sleep study in order to get new equipment shows severe OSA.  Good CPAP compliance. Preventive care: Check a PSA (is overdue) Rec COVID-19 #4 vaccination. RTC 4 months CPX

## 2020-09-17 LAB — COMPREHENSIVE METABOLIC PANEL
ALT: 22 U/L (ref 0–53)
AST: 16 U/L (ref 0–37)
Albumin: 4.4 g/dL (ref 3.5–5.2)
Alkaline Phosphatase: 59 U/L (ref 39–117)
BUN: 29 mg/dL — ABNORMAL HIGH (ref 6–23)
CO2: 24 mEq/L (ref 19–32)
Calcium: 9.6 mg/dL (ref 8.4–10.5)
Chloride: 100 mEq/L (ref 96–112)
Creatinine, Ser: 1.22 mg/dL (ref 0.40–1.50)
GFR: 65.96 mL/min (ref >60.00)
Glucose, Bld: 186 mg/dL — ABNORMAL HIGH (ref 70–99)
Potassium: 4.4 mEq/L (ref 3.5–5.1)
Sodium: 138 mEq/L (ref 135–145)
Total Bilirubin: 0.9 mg/dL (ref 0.2–1.2)
Total Protein: 7.3 g/dL (ref 6.0–8.3)

## 2020-09-17 LAB — LIPID PANEL
Cholesterol: 202 mg/dL — ABNORMAL HIGH (ref 0–200)
HDL: 33.1 mg/dL — ABNORMAL LOW (ref >39.00)
NonHDL: 168.79
Total CHOL/HDL Ratio: 6
Triglycerides: 253 mg/dL — ABNORMAL HIGH (ref 0.0–149.0)
VLDL: 50.6 mg/dL — ABNORMAL HIGH (ref 0.0–40.0)

## 2020-09-17 LAB — LDL CHOLESTEROL, DIRECT: Direct LDL: 118 mg/dL

## 2020-09-18 ENCOUNTER — Other Ambulatory Visit: Payer: Self-pay | Admitting: Internal Medicine

## 2020-09-18 ENCOUNTER — Encounter: Payer: Self-pay | Admitting: Internal Medicine

## 2020-09-18 ENCOUNTER — Other Ambulatory Visit (HOSPITAL_BASED_OUTPATIENT_CLINIC_OR_DEPARTMENT_OTHER): Payer: Self-pay

## 2020-09-18 MED ORDER — OXYCODONE-ACETAMINOPHEN 10-325 MG PO TABS: 1.0000 | ORAL_TABLET | Freq: Two times a day (BID) | ORAL | 0 refills | Status: DC

## 2020-09-18 MED ORDER — PFIZER-BIONT COVID-19 VAC-TRIS 30 MCG/0.3ML IM SUSP
INTRAMUSCULAR | 0 refills | Status: DC
  Filled 2020-09-18: qty 0.3, 1d supply, fill #0

## 2020-09-18 MED ORDER — TIZANIDINE HCL 4 MG PO TABS: 4.0000 mg | ORAL_TABLET | Freq: Three times a day (TID) | ORAL | 0 refills | Status: DC | PRN

## 2020-09-21 ENCOUNTER — Other Ambulatory Visit: Payer: Self-pay | Admitting: Internal Medicine

## 2020-09-24 ENCOUNTER — Telehealth: Payer: Self-pay

## 2020-09-24 NOTE — Telephone Encounter (Signed)
Received surgical clearance form from [facility]. Pt is needing a Lumar fusion w/ Dr. Duffy Rhody, date of surgery TBD. Last OV 09/14/2020, last EKG 2008.    Will reach out to Pt to see when he is available to come in for surgical clearance.

## 2020-09-28 ENCOUNTER — Encounter: Payer: Self-pay | Admitting: Physical Medicine and Rehabilitation

## 2020-09-28 ENCOUNTER — Other Ambulatory Visit (HOSPITAL_COMMUNITY): Payer: Self-pay | Admitting: Neurosurgery

## 2020-09-28 ENCOUNTER — Other Ambulatory Visit: Payer: Self-pay | Admitting: Neurosurgery

## 2020-09-28 ENCOUNTER — Other Ambulatory Visit: Payer: Self-pay

## 2020-09-28 ENCOUNTER — Encounter: Payer: 59 | Attending: Physical Medicine and Rehabilitation | Admitting: Physical Medicine and Rehabilitation

## 2020-09-28 VITALS — BP 125/89 | HR 96 | Temp 98.9°F | Ht 74.0 in | Wt 313.0 lb

## 2020-09-28 DIAGNOSIS — M5106 Intervertebral disc disorders with myelopathy, lumbar region: Secondary | ICD-10-CM | POA: Diagnosis present

## 2020-09-28 DIAGNOSIS — Z79891 Long term (current) use of opiate analgesic: Secondary | ICD-10-CM

## 2020-09-28 DIAGNOSIS — Z5181 Encounter for therapeutic drug level monitoring: Secondary | ICD-10-CM | POA: Insufficient documentation

## 2020-09-28 DIAGNOSIS — M4712 Other spondylosis with myelopathy, cervical region: Secondary | ICD-10-CM | POA: Diagnosis present

## 2020-09-28 DIAGNOSIS — G629 Polyneuropathy, unspecified: Secondary | ICD-10-CM | POA: Insufficient documentation

## 2020-09-28 DIAGNOSIS — G894 Chronic pain syndrome: Secondary | ICD-10-CM | POA: Insufficient documentation

## 2020-09-28 DIAGNOSIS — M48062 Spinal stenosis, lumbar region with neurogenic claudication: Secondary | ICD-10-CM

## 2020-09-28 MED ORDER — DULOXETINE HCL 30 MG PO CPEP: 30.0000 mg | ORAL_CAPSULE | Freq: Every day | ORAL | 3 refills | Status: DC

## 2020-09-28 MED ORDER — PREDNISONE 10 MG (21) PO TBPK: ORAL_TABLET | ORAL | 0 refills | Status: AC

## 2020-09-28 NOTE — Patient Instructions (Signed)
Pt is a 55 yr old  L handed male with hx of OSA, DM and HTN; with hx of R knee pain (no imaging) and Low back pain with B/ radiculopathy and sciatica Sx's down to calves B/L .  Also has severe spinal stenosis with complete effacement of CSF at L1/2, L L3 nerve root impingement, L4/5 advanced thecal sac compression , severe R foraminal impingement- Here for evaluation of nerve pain/chronic pain.     Dr. Marcello Moores wanted to do his neck- per pt and not lumbar spine - but pt reports severe back pain, not neck pain- has Hoffman's and increase DTR's.  Gave FMLA paperwork to Dr Marcello Moores- pending for pt.  Cymbalta- /Duloxetine 30 mg nightly x 4 days, then 60 mg nightly- for nerve pain.  Will have pt see Dr Mellody Drown- went over MRI of cervical and lumbar spine myself and called [facility] form room and discussed pt's overall issues- pt truly wants back done before neck - also educated them on Hoffman's and signs of spasticity beginning which are signs cervical spine at least somewhat compressed.  [place]  Low dose Naltrexone- 4 mg daily- for 4 days then 8 mg daily- sent in 6 month refill. Suggest starting this medicine today- wait 2-3 days before starting Cymbalta.  Will not need referral to [facility] since already been there- do d/c appt with other neurosurgeon for Monday.  F/U 6 weeks - wears lift in R shoe- leg length discrepancy.

## 2020-09-28 NOTE — Progress Notes (Signed)
Subjective:    Patient ID: [name], male    DOB: 04/01/1966, 55 y.o.   MRN: [id number]  HPI  Pt is a 55 yr old  L handed male with hx of OSA, DM and HTN; with hx of R knee pain (no imaging) and Low back pain with B/ radiculopathy and sciatica Sx's down to calves B/L .  Also has severe spinal stenosis with complete effacement of CSF at L1/2, L L3 nerve root impingement, L4/5 advanced thecal sac compression , severe R foraminal impingement- Here for evaluation of nerve pain/chronic pain.      Needs R knee replacing- - Dr Durward Fortes- Won't do, because of his BMI- hasn't get down to 280 lbs- is 313 lbs.   All he's taking is Oxycodone Also taking Zanaflex- somewhat helpful- lasts 4-5 hours, Got epidural steroid injection in March 2022- Dr Davy Pique did it. Supposed to get another 10/22/20.   Tried:  Gabapentin- helps some- takes some pain away- 600 mg 3x/day.  Never tried Lyrica Never tried Duloxetine/Cymbalta   Is 61ft 2 inches- mainly from torso down.    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     Also sharp pain- keeps muscles in back sore- and muscles in legs aches.  R>L.  It's gotten to the point Oxycodone doesn't work.    Pain Inventory Average Pain 10 Pain Right Now 10 My pain is sharp, stabbing, and tingling  In the last 24 hours, has pain interfered with the following? General activity 4 Relation with others 4 Enjoyment of life 3 What TIME of day is your pain at its worst? morning , daytime, evening, and night Sleep (in general) Poor  Pain is worse with: walking, bending, sitting, inactivity, and standing Pain improves with: heat/ice and medication Relief from Meds: 6  walk without assistance how many minutes can you walk? 5 ability to climb steps?  yes do you drive?   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  trouble walking  new  new    Family History  Problem Relation Age of Onset   Diabetes Mother    Hypertension Neg Hx    Coronary artery disease Neg Hx    Colon cancer Neg Hx    Prostate cancer Neg Hx    Social History   Socioeconomic History   Marital status: Married    Spouse name: [name]   Number of children: 2   Years of education: Not on file   Highest education level: Not on file  Occupational History   Occupation: Therapist, art: [employer]   Occupation: Music therapist: [employer]  Tobacco Use   Smoking status: Never   Smokeless tobacco: Never  Scientific laboratory technician Use: Never used  Substance and Sexual Activity   Alcohol use: No   Drug use: No   Sexual activity: Yes  Other Topics Concern   Not on file  Social History Narrative   Remarried for the 3rd time w/ a lady for the [place]   Children:  2010 , 2012   Left handed                Social Determinants of Health  Financial Resource Strain: Not on file  Food Insecurity: Not on file  Transportation Needs: Not on file  Physical Activity: Not on file  Stress: Not on file  Social Connections: Not on file   Past Surgical History:  Procedure Laterality Date   NO PAST SURGERIES     Past Medical History:  Diagnosis Date   DDD (degenerative disc disease), lumbar    Diabetes mellitus    Hyperlipemia    Hypertension    Obesity    OSA (obstructive sleep apnea)    on CPAP   BP 125/89   Pulse 96   Temp 98.9 F (37.2 C)   Ht 6\' 2"  (1.88 m)   Wt (!) 313 lb (142 kg)   SpO2 97%   BMI 40.19 kg/m   Opioid Risk Score:   Fall Risk Score:  `1  Depression screen PHQ 2/9  Depression screen [facility] 2/9 09/14/2020 02/10/2020 03/02/2017 07/09/2015  Decreased Interest 0 0 0 0  Down, Depressed, Hopeless 0 0 0 0  PHQ - 2 Score 0 0 0 0    Review of Systems  Constitutional: Negative.   HENT:  Negative.    Eyes: Negative.   Respiratory: Negative.    Cardiovascular: Negative.   Endocrine: Negative.   Genitourinary: Negative.   Musculoskeletal:  Positive for arthralgias, back pain and gait problem.  Skin: Negative.   Allergic/Immunologic: Negative.   Hematological: Negative.   All other systems reviewed and are negative.     Objective:   Physical Exam Awake, alert, appropriate, very tall- 42ft 2 inches, accompanied by wife, NAD MS: Deltoids 4+/5, biceps 5/5, triceps 5/5, and WE 4/5 on R: 5/5 on L; grip 5-/5 B/L and finger abd 4+/5 B/L LE: HF 5-/5 B/L ; KE 5/5 on L; 4-/5 on R (due to pain). DF 4+/5 on R and 5-/5 on L; PF 5/5 B/L Neuro: Hoffman's (+) B/L- R>L  R leg is shorter than L- started wearing shoe lift in 90s.       Assessment & Plan:   Pt is a 55 yr old  L handed male with hx of OSA, DM and HTN; with hx of R knee pain (no imaging) and Low back pain with B/ radiculopathy and sciatica Sx's down to calves B/L .  Also has severe spinal stenosis with complete effacement of CSF at L1/2, L L3 nerve root impingement, L4/5 advanced thecal sac compression , severe R foraminal impingement- Here for evaluation of nerve pain/chronic pain.     Dr. Marcello Moores wanted to do his neck- per pt and not lumbar spine - but pt reports severe back pain, not neck pain- has Hoffman's and increase DTR's.  Gave FMLA paperwork to Dr Marcello Moores- pending for pt.  Cymbalta- /Duloxetine 30 mg nightly x 4 days, then 60 mg nightly- for nerve pain.  Will have pt see Dr Mellody Drown- went over MRI of cervical and lumbar spine myself and called [facility] form room and discussed pt's overall issues- pt truly wants back done before neck - also educated them on Hoffman's and signs of spasticity beginning which are signs cervical spine at least somewhat compressed.  [place]  Low dose Naltrexone- 4 mg daily- for 4 days then 8 mg daily- sent in 6 month refill. Suggest  starting this medicine today- wait 2-3 days before starting Cymbalta.  Will not need referral to [facility] since already been there- do d/c appt with other neurosurgeon for Monday.  F/U 6 weeks -  wears lift in R shoe- leg length discrepancy.  Would like to try Naltrexone before oxycodone.  9. Wil try Prednisone 40 mg daily x 5 days- won't go higher because of DM and A1c of 7.1-

## 2020-09-29 ENCOUNTER — Other Ambulatory Visit: Payer: Self-pay | Admitting: Internal Medicine

## 2020-10-02 NOTE — Telephone Encounter (Signed)
In PCP red folder for completion.

## 2020-10-03 ENCOUNTER — Other Ambulatory Visit: Payer: Self-pay | Admitting: Internal Medicine

## 2020-10-03 LAB — TOXASSURE SELECT,+ANTIDEPR,UR

## 2020-10-04 NOTE — Telephone Encounter (Signed)
Pt has not contacted office back to schedule surgical clearance appt. Form faxed back with this information to [facility] at [phone]. Closing note for now.

## 2020-10-08 ENCOUNTER — Ambulatory Visit: Payer: 59 | Admitting: Internal Medicine

## 2020-10-08 ENCOUNTER — Other Ambulatory Visit: Payer: Self-pay

## 2020-10-08 ENCOUNTER — Telehealth: Payer: Self-pay | Admitting: *Deleted

## 2020-10-08 ENCOUNTER — Encounter: Payer: Self-pay | Admitting: Internal Medicine

## 2020-10-08 VITALS — BP 136/78 | HR 119 | Temp 97.8°F | Resp 18 | Ht 74.0 in | Wt 309.1 lb

## 2020-10-08 DIAGNOSIS — Z01818 Encounter for other preprocedural examination: Secondary | ICD-10-CM

## 2020-10-08 DIAGNOSIS — Z01810 Encounter for preprocedural cardiovascular examination: Secondary | ICD-10-CM

## 2020-10-08 DIAGNOSIS — I10 Essential (primary) hypertension: Secondary | ICD-10-CM

## 2020-10-08 DIAGNOSIS — E785 Hyperlipidemia, unspecified: Secondary | ICD-10-CM

## 2020-10-08 DIAGNOSIS — R9431 Abnormal electrocardiogram [ECG] [EKG]: Secondary | ICD-10-CM

## 2020-10-08 MED ORDER — TIZANIDINE HCL 4 MG PO TABS: 4.0000 mg | ORAL_TABLET | Freq: Four times a day (QID) | ORAL | 0 refills | Status: DC | PRN

## 2020-10-08 MED ORDER — ATORVASTATIN CALCIUM 10 MG PO TABS: 10.0000 mg | ORAL_TABLET | Freq: Every day | ORAL | 2 refills | Status: DC

## 2020-10-08 NOTE — Patient Instructions (Addendum)
Okay to increase tizanidine to 1 tablet every 6 hours  Start atorvastatin 10 mg 1 every night  Come back in 6 weeks for labs    We are referring you to the heart doctor

## 2020-10-08 NOTE — Telephone Encounter (Signed)
Urine drug screen for this encounter is consistent for prescribed medication 

## 2020-10-08 NOTE — Progress Notes (Signed)
Subjective:    Patient ID: [name], male    DOB: 07/20/1965, 55 y.o.   MRN: [id number]  DOS:  10/08/2020 Type of visit - description: Surgical clearance  Since the last visit, he saw neurosurgery, note reviewed.  He is here for a surgical clearance. Back pain is severe. He denies chest pain, difficulty breathing or palpitations.  The only limiting factor for exertion is back pain He is doing great with his diet and continue to lose weight.   Review of Systems See above   Past Medical History:  Diagnosis Date   DDD (degenerative disc disease), lumbar    Diabetes mellitus    Hyperlipemia    Hypertension    Obesity    OSA (obstructive sleep apnea)    on CPAP    Past Surgical History:  Procedure Laterality Date   NO PAST SURGERIES      Allergies as of 10/08/2020       Reactions   Penicillins    REACTION: unspecified        Medication List        Accurate as of October 08, 2020 11:59 PM. If you have any questions, ask your nurse or doctor.          STOP taking these medications    Pfizer-BioNT COVID-19 Vac-TriS Susp injection Generic drug: COVID-19 mRNA Vac-TriS Therapist, music) Stopped by: [name], MD       TAKE these medications    aspirin 81 MG tablet Take 81 mg by mouth daily.   atorvastatin 10 MG tablet Commonly known as: LIPITOR Take 1 tablet (10 mg total) by mouth at bedtime. Started by: [name], MD   blood glucose meter kit and supplies Use up to four times daily as directed. Dx: E11.9   DULoxetine 30 MG capsule Commonly known as: Cymbalta Take 1 capsule (30 mg total) by mouth at bedtime. X 4 days, then 60 mg/2 capsules nightly- for nerve pain   etodolac 500 MG tablet Commonly known as: LODINE Take 1 tablet (500 mg total) by mouth 2 (two) times daily as needed.   fluticasone 50 MCG/ACT nasal spray Commonly known as: FLONASE Place 2 sprays into both nostrils daily.   gabapentin 300 MG capsule Commonly known as: NEURONTIN TAKE 2  CAPSULES BY MOUTH 3 TIMES DAILY.   losartan-hydrochlorothiazide 100-12.5 MG tablet Commonly known as: HYZAAR TAKE 1 TABLET BY MOUTH EVERY DAY   metFORMIN 1000 MG tablet Commonly known as: GLUCOPHAGE Take 1 tablet (1,000 mg total) by mouth 2 (two) times daily with a meal.   OneTouch Delica Lancets 93G Misc Test blood sugar twice a day.  Dx code: E11.9   OneTouch Verio Flex System w/Device Kit Test blood sugar twice a day.  Dx code: E11.9   OneTouch Verio test strip Generic drug: glucose blood TEST BLOOD SUGAR TWICE A DAY. DX CODE: E11.9   oxyCODONE-acetaminophen 10-325 MG tablet Commonly known as: Percocet Take 1 tablet by mouth in the morning and at bedtime.   predniSONE 10 MG (21) Tbpk tablet Commonly known as: STERAPRED UNI-PAK 21 TAB Take 4 tablets (40 mg total) by mouth daily for 5 days, THEN 4 tablets (40 mg total) daily for 5 days. Start taking on: September 28, 2020   tadalafil 20 MG tablet Commonly known as: CIALIS TAKE 1/2 TO 1 TABLET BY MOUTH DAILY AS NEEDED FOR ERECTILE DYSFUNCTION   tiZANidine 4 MG tablet Commonly known as: ZANAFLEX Take 1 tablet (4 mg total) by mouth every  6 (six) hours as needed for muscle spasms. What changed: when to take this Changed by: [name], MD   Vitamin D (Ergocalciferol) 1.25 MG (50000 UNIT) Caps capsule Commonly known as: DRISDOL Take 1 capsule (50,000 Units total) by mouth every 7 (seven) days.           Objective:   Physical Exam BP 136/78 (BP Location: Left Arm, Patient Position: Sitting, Cuff Size: Normal)   Pulse (!) 119   Temp 97.8 F (36.6 C) (Oral)   Resp 18   Ht '6\' 2"'  (1.88 m)   Wt (!) 309 lb 2 oz (140.2 kg)   SpO2 97%   BMI 39.69 kg/m  General:   Well developed, NAD, BMI noted. HEENT:  Normocephalic . Face symmetric, atraumatic Lungs:  CTA B Normal respiratory effort, no intercostal retractions, no accessory muscle use. Heart: Tachycardic Lower extremities: no pretibial edema bilaterally  Skin: Not  pale. Not jaundice Neurologic:  alert & oriented X3.  Speech normal, gait and posture antalgic Psych--  Cognition and judgment appear intact.  Cooperative with normal attention span and concentration.  Behavior appropriate. No anxious or depressed appearing.      Assessment     Assessment DM NO neuropathy per [facility] 07-2020.  SX related to back DJD HTN Hyperlipidemia MSK: -DJD Knees , back (severe per MRI, + radiation B legs) -Chronic neck pain Morbid obesity OSA( severe per repeated study April 2022), on CPAP Diastases recti   PLAN: Chronic back pain Note from neurosurgery 09/21/2020: They recommended extensive back surgery for severe lumbar stenosis and DJD.  They request both medical and cardiac clearance. Surgical clearance: The patient 83, has diabetes, hypertension, hyperlipidemia.  No known heart disease. Ambulatory BPs are all normal. He is a slightly tachycardic today, on chart review he has tachycardia on and off.  Today increased heart rate is likely related to pain. EKG today: Sinus tachycardia, the EKG is not normal, suspect changes related to RBBB, denies chest pain or difficulty breathing.. Plan: Refer to cardiology for cardiac clearance HTN: Well-controlled on Hyzaar Hyperlipidemia: So far untreated, I think it would beneficial to him to start Lipitor 10 mg.  Labs in 6-week.      This visit occurred during the SARS-CoV-2 public health emergency.  Safety protocols were in place, including screening questions prior to the visit, additional usage of staff PPE, and extensive cleaning of exam room while observing appropriate contact time as indicated for disinfecting solutions.

## 2020-10-08 NOTE — Telephone Encounter (Signed)
Pt seen today for pre op clearance, form completed and faxed to [facility] at [phone]. Pt also needing cardio clearance. Form sent for scanning.

## 2020-10-09 ENCOUNTER — Telehealth: Payer: Self-pay

## 2020-10-09 NOTE — Assessment & Plan Note (Signed)
Chronic back pain Note from neurosurgery 09/21/2020: They recommended extensive back surgery for severe lumbar stenosis and DJD.  They request both medical and cardiac clearance. Surgical clearance: The patient 71, has diabetes, hypertension, hyperlipidemia.  No known heart disease. Ambulatory BPs are all normal. He is a slightly tachycardic today, on chart review he has tachycardia on and off.  Today increased heart rate is likely related to pain. EKG today: Sinus tachycardia, the EKG is not normal, suspect changes related to RBBB, denies chest pain or difficulty breathing.. Plan: Refer to cardiology for cardiac clearance HTN: Well-controlled on Hyzaar Hyperlipidemia: So far untreated, I think it would beneficial to him to start Lipitor 10 mg.  Labs in 6-week.

## 2020-10-09 NOTE — Telephone Encounter (Signed)
New patient referral was brought into pre op.   We will take to chart prep.

## 2020-10-10 MED ORDER — METHOCARBAMOL 500 MG PO TABS: 500.0000 mg | ORAL_TABLET | Freq: Four times a day (QID) | ORAL | 1 refills | Status: DC | PRN

## 2020-10-12 ENCOUNTER — Telehealth: Payer: Self-pay

## 2020-10-12 NOTE — Telephone Encounter (Signed)
Referral notes sent from [facility], Phone #: [phone]  Notes sent to scheduling

## 2020-10-14 NOTE — Progress Notes (Signed)
[name] Reason for referral-preoperative cardiovascular evaluation  HPI: 55 year old male for preoperative cardiovascular evaluation prior to back surgery at request of Christian November MD. Calcium score 3/14 94.7.  Patient developed back pain in January.  It has slowly progressed to the point that he has very limited mobility.  He otherwise denies dyspnea, chest pain, palpitations or syncope.  Cardiology asked to evaluate as he will require low back surgery.  He also will require cervical disc surgery and knee surgery in the near future.  Current Outpatient Medications  Medication Sig Dispense Refill   aspirin 81 MG tablet Take 81 mg by mouth daily.       atorvastatin (LIPITOR) 10 MG tablet Take 1 tablet (10 mg total) by mouth at bedtime. 30 tablet 2   blood glucose meter kit and supplies Use up to four times daily as directed. Dx: E11.9 1 each 0   Blood Glucose Monitoring Suppl ([place]) w/Device KIT Test blood sugar twice a day.  Dx code: E11.9 1 kit 0   DULoxetine (CYMBALTA) 30 MG capsule Take 1 capsule (30 mg total) by mouth at bedtime. X 4 days, then 60 mg/2 capsules nightly- for nerve pain 60 capsule 3   etodolac (LODINE) 500 MG tablet Take 1 tablet (500 mg total) by mouth 2 (two) times daily as needed. 60 tablet 0   fluticasone (FLONASE) 50 MCG/ACT nasal spray Place 2 sprays into both nostrils daily. 16 g 5   gabapentin (NEURONTIN) 300 MG capsule TAKE 2 CAPSULES BY MOUTH 3 TIMES DAILY. 180 capsule 1   losartan-hydrochlorothiazide (HYZAAR) 100-12.5 MG tablet TAKE 1 TABLET BY MOUTH EVERY DAY 90 tablet 1   metFORMIN (GLUCOPHAGE) 1000 MG tablet Take 1 tablet (1,000 mg total) by mouth 2 (two) times daily with a meal. 180 tablet 1   methocarbamol (ROBAXIN) 500 MG tablet Take 1 tablet (500 mg total) by mouth every 6 (six) hours as needed for muscle spasms (only take when needed). 120 tablet 1   NALTREXONE HCL PO Take 4 mg by mouth daily.     OneTouch Delica Lancets  61Y MISC Test blood sugar twice a day.  Dx code: E11.9 100 each 1   ONETOUCH VERIO test strip TEST BLOOD SUGAR TWICE A DAY. DX CODE: E11.9 100 strip 12   oxyCODONE-acetaminophen (PERCOCET) 10-325 MG tablet Take 1 tablet by mouth in the morning and at bedtime. 30 tablet 0   tadalafil (CIALIS) 20 MG tablet TAKE 1/2 TO 1 TABLET BY MOUTH DAILY AS NEEDED FOR ERECTILE DYSFUNCTION 30 tablet 3   tiZANidine (ZANAFLEX) 4 MG tablet Take 1 tablet (4 mg total) by mouth every 6 (six) hours as needed for muscle spasms. 120 tablet 0   Vitamin D, Ergocalciferol, (DRISDOL) 1.25 MG (50000 UNIT) CAPS capsule Take 1 capsule (50,000 Units total) by mouth every 7 (seven) days. 4 capsule 3   No current facility-administered medications for this visit.    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    Employer: [employer]   Occupation: Music therapist: [employer]  Tobacco Use   Smoking status: Never   Smokeless tobacco: Never  Scientific laboratory technician Use: Never used  Substance and Sexual Activity   Alcohol use: No   Drug use: No   Sexual activity: Yes  Other Topics Concern   Not on file  Social History Narrative   Remarried for the 3rd time w/ a lady for the [place]   Children:  2010 , 2012   Left handed                Social Determinants of Health   Financial Resource Strain: Not on file  Food Insecurity: Not on file   Transportation Needs: Not on file  Physical Activity: Not on file  Stress: Not on file  Social Connections: Not on file  Intimate Partner Violence: Not on file    Family History  Problem Relation Age of Onset   Diabetes Mother    Hypertension Neg Hx    Coronary artery disease Neg Hx    Colon cancer Neg Hx    Prostate cancer Neg Hx     ROS: Back pain and knee arthralgias but no fevers or chills, productive cough, hemoptysis, dysphasia, odynophagia, melena, hematochezia, dysuria, hematuria, rash, seizure activity, orthopnea, PND, pedal edema, claudication. Remaining systems are negative.  Physical Exam:   Blood pressure 140/78, pulse (!) 103, height _0  (1.88 m), weight (!) 313 lb (142 kg).  General:  Well developed/obese in NAD Skin warm/dry Patient not depressed No peripheral clubbing Back-normal HEENT-normal/normal eyelids Neck supple/normal carotid upstroke bilaterally; no bruits; no JVD; no thyromegaly chest - CTA/ normal expansion CV - RRR/normal S1 and S2; no murmurs, rubs or gallops;  PMI nondisplaced Abdomen -NT/ND, no HSM, no mass, + bowel sounds, no bruit 2+ femoral pulses, no bruits Ext-no edema, chords, 2+ DP Neuro-grossly nonfocal  ECG -sinus tachycardia at a rate of 103, right bundle branch block.  Personally reviewed  A/P  1 preoperative cardiovascular exam-patient has elevated calcium score on previous study 8 years ago.  He also has diabetes for approximately 15 years.  He has limited functional capacity due to back pain and therefore difficult to assess.  I will arrange a [place] nuclear study to screen for coronary disease.  If low risk or normal he may proceed.  2 hypertension-patient's blood pressure is controlled.  Continue present medications.  3 coronary artery disease-based on previous calcium score demonstrating coronary calcification.  Continue aspirin and statin.  4 hyperlipidemia-given documented coronary disease based on elevated calcium  score as well as diabetes mellitus I would prefer that he be on high-dose statin.  He had difficulties with higher doses of Lipitor in the past.  I will try Crestor 20 mg daily to see if he tolerates.  Check lipids and liver in 12 weeks.  Kirk Ruths, MD

## 2020-10-19 ENCOUNTER — Other Ambulatory Visit: Payer: Self-pay

## 2020-10-19 ENCOUNTER — Ambulatory Visit (HOSPITAL_COMMUNITY)
Admission: RE | Admit: 2020-10-19 | Discharge: 2020-10-19 | Disposition: A | Discharge status: Home / Self Care | Payer: 59 | Source: Ambulatory Visit | Attending: Neurosurgery | Admitting: Neurosurgery

## 2020-10-19 DIAGNOSIS — M48062 Spinal stenosis, lumbar region with neurogenic claudication: Secondary | ICD-10-CM | POA: Diagnosis not present

## 2020-10-19 IMAGING — CT CT L SPINE W/O CM
3 series · 13 of 33 positions shown, 16 images · non-contrast
Comparison: Lumbar MRI [DATE]

CLINICAL DATA: CT for intraoperative localization.

EXAM:
CT LUMBAR SPINE WITHOUT CONTRAST
TECHNIQUE: Multidetector CT imaging of the lumbar spine was performed without
intravenous contrast administration. Multiplanar CT image
reconstructions were also generated.

[Series 4: l spine soft · axial · 0.42mm/px · z∈[-540,-364]mm · 5 of 128 slices shown, 7 images]
[im 20/128  soft-tissue]
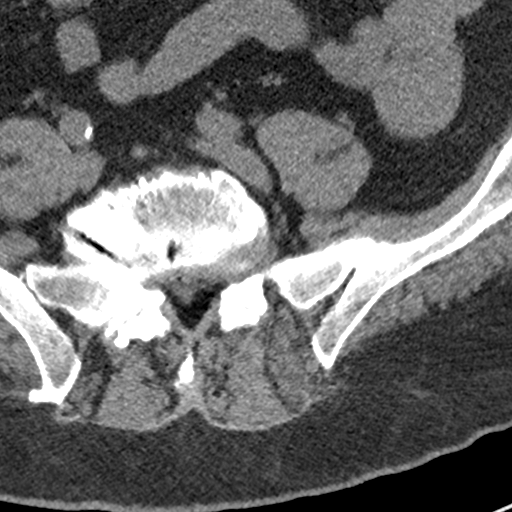
[im 20/128  bone]
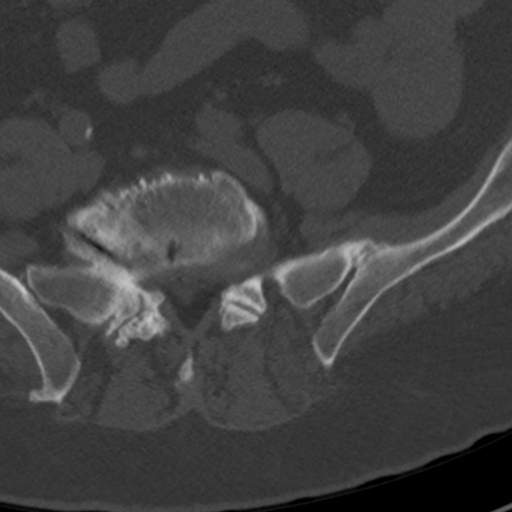
[im 40/128  bone]
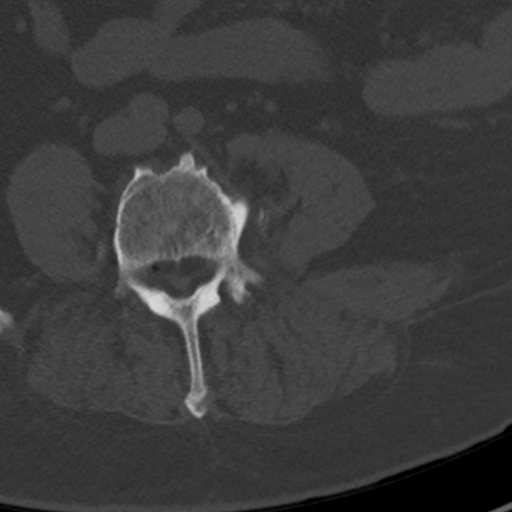
[im 69/128  bone]
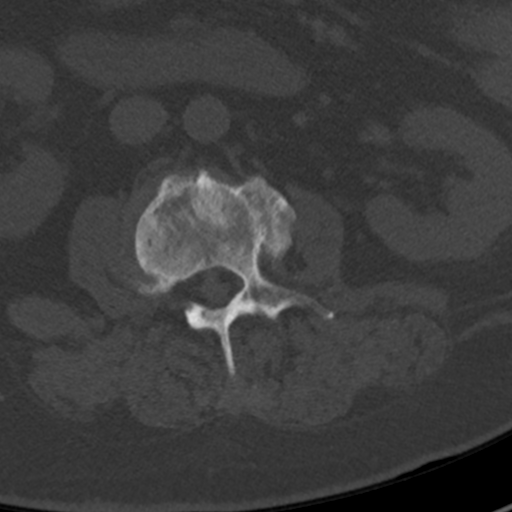
[im 88/128  bone]
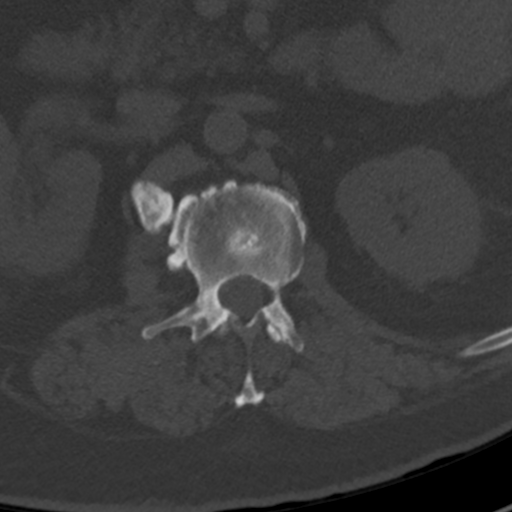
[im 108/128  soft-tissue]
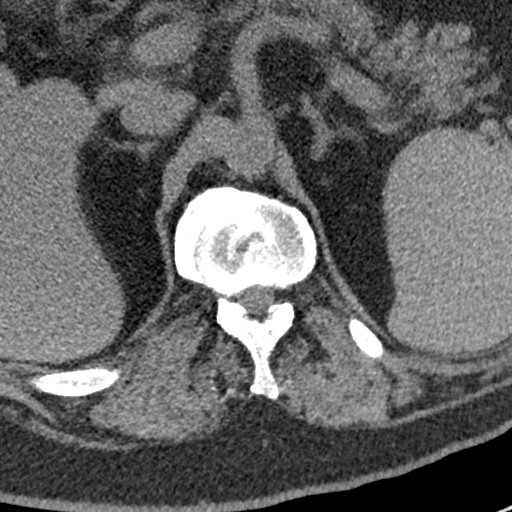
[im 108/128  bone]
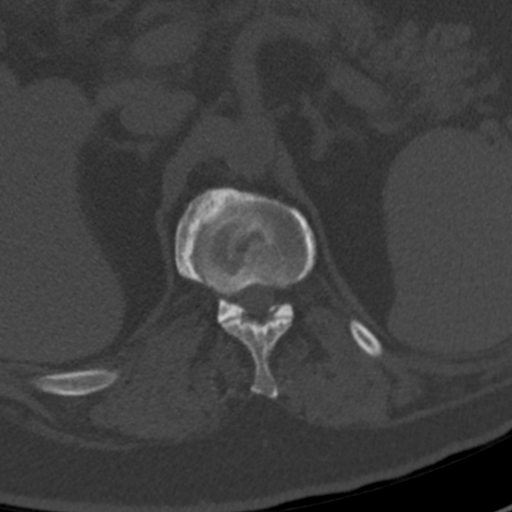

[Series 7: sagittal bone · sagittal · 0.38mm/px · 5 of 158 slices shown, 6 images]
[im 53/158  bone]
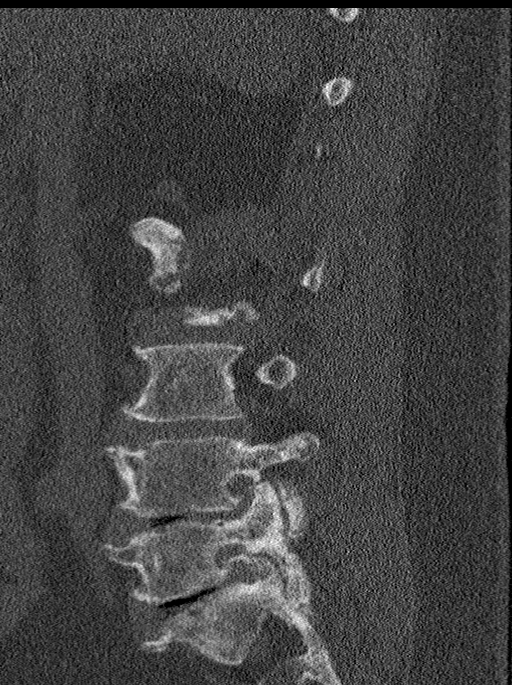
[im 66/158  bone]
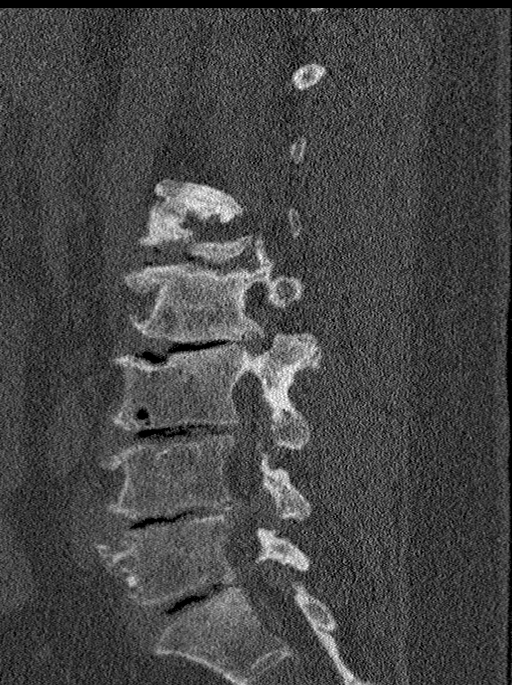
[im 79/158  soft-tissue]
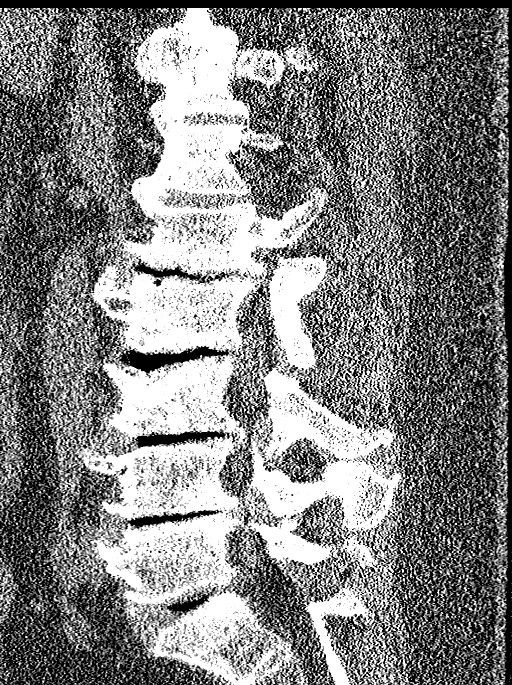
[im 79/158  bone]
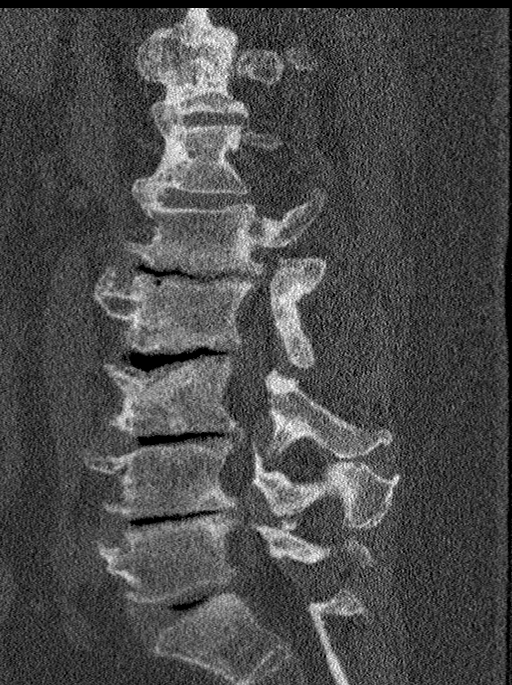
[im 92/158  bone]
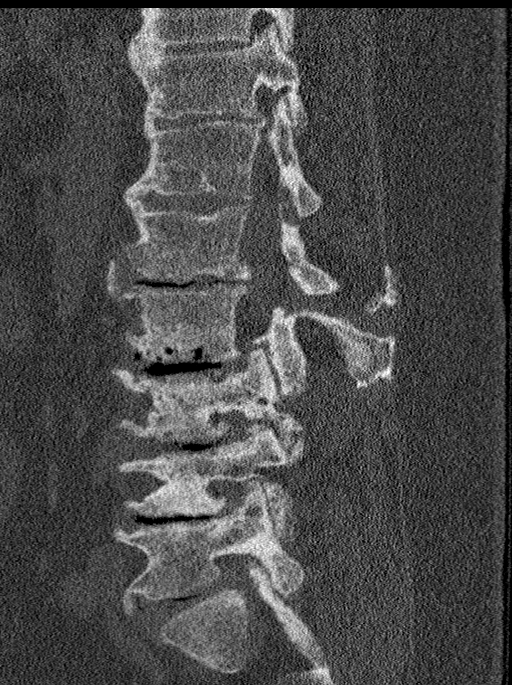
[im 105/158  bone]
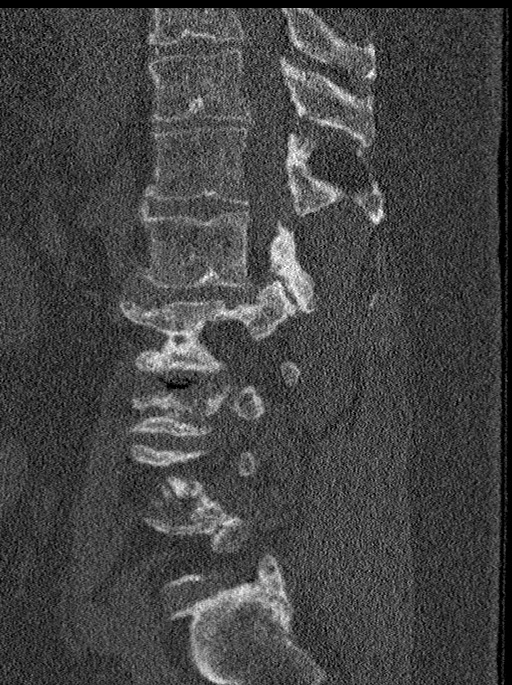

[Series 8: coronal bone · coronal · 0.38mm/px · 3 of 151 slices shown]
[im 31/151  bone]
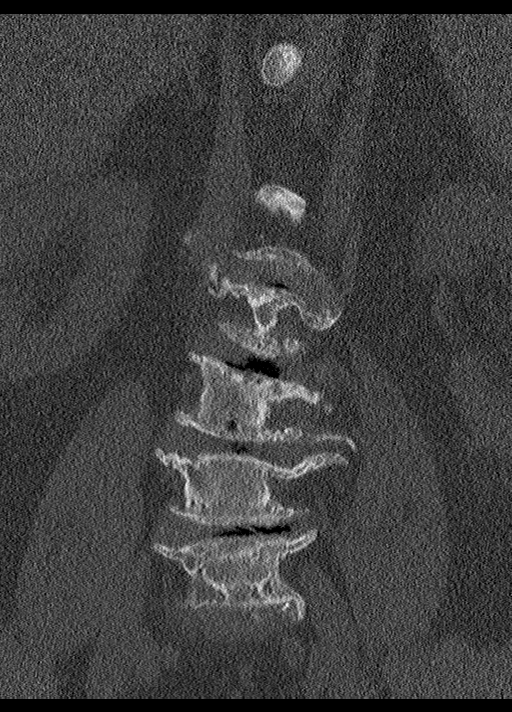
[im 61/151  bone]
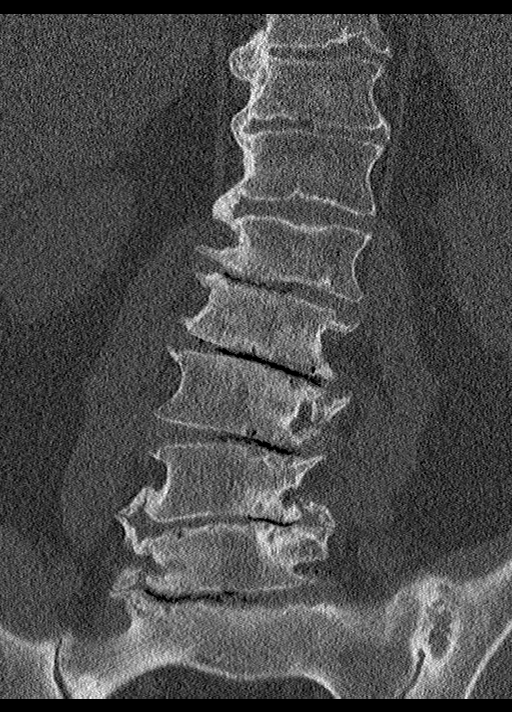
[im 91/151  bone]
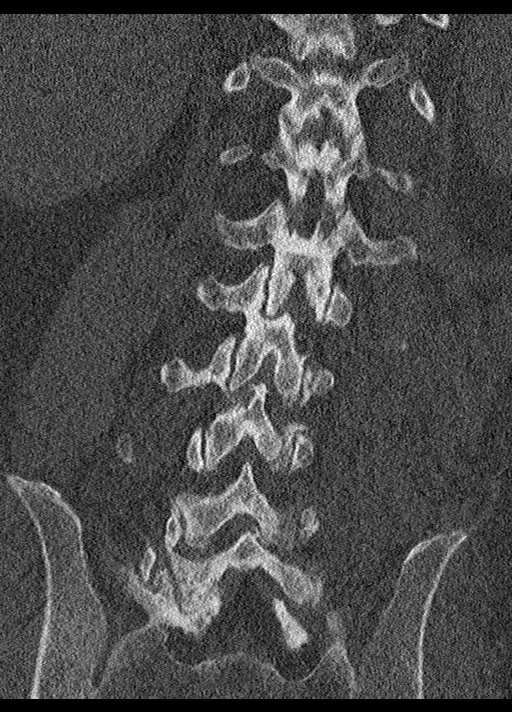

[13 of 33 positions shown; findings below may reference images not displayed]

FINDINGS: Segmentation: 5 lumbar type vertebrae

Alignment: Mild scoliosis.  Straightening of lumbar lordosis.

Vertebrae: Extensive degenerative endplate sclerosis and subchondral
cystic change. No evidence of fracture or bone lesion. There are
bridging osteophytes from T10-L1.

Paraspinal and other soft tissues: No perispinal mass or
inflammation is seen.

Disc levels: Disc space collapse with gas containing fissures at
L1-2 to L5-S1. There is associated severe endplate degeneration with
endplate and facet spurring diffusely. The spinal canal is
congenitally narrow from short pedicles and there is diffuse
epidural lipomatosis. Notable gas containing herniation left
paracentral at L1-2 and right foraminal at L4-5. No new finding when
compared to prior MRI
IMPRESSION: 1. Treatment planning CT which redemonstrates severe spinal
degeneration exacerbated by short pedicles and epidural lipomatosis.
2. Ankylosis from bridging intervertebral osteophytes at T10-L1.

## 2020-10-20 ENCOUNTER — Other Ambulatory Visit: Payer: Self-pay | Admitting: Physical Medicine and Rehabilitation

## 2020-10-22 ENCOUNTER — Encounter: Payer: Self-pay | Admitting: Cardiology

## 2020-10-22 ENCOUNTER — Other Ambulatory Visit: Payer: Self-pay

## 2020-10-22 ENCOUNTER — Ambulatory Visit: Payer: 59 | Admitting: Cardiology

## 2020-10-22 VITALS — BP 140/78 | HR 103 | Ht 74.0 in | Wt 313.0 lb

## 2020-10-22 DIAGNOSIS — E7800 Pure hypercholesterolemia, unspecified: Secondary | ICD-10-CM | POA: Diagnosis not present

## 2020-10-22 DIAGNOSIS — I2510 Atherosclerotic heart disease of native coronary artery without angina pectoris: Secondary | ICD-10-CM

## 2020-10-22 DIAGNOSIS — I10 Essential (primary) hypertension: Secondary | ICD-10-CM | POA: Diagnosis not present

## 2020-10-22 DIAGNOSIS — Z01810 Encounter for preprocedural cardiovascular examination: Secondary | ICD-10-CM | POA: Diagnosis not present

## 2020-10-22 MED ORDER — ROSUVASTATIN CALCIUM 20 MG PO TABS: 20.0000 mg | ORAL_TABLET | Freq: Every day | ORAL | 3 refills | Status: DC

## 2020-10-22 NOTE — Patient Instructions (Signed)
Medication Instructions:   STOP ATORVASTATIN  START ROSUVASTATIN 20 MG ONCE DAILY  *If you need a refill on your cardiac medications before your next appointment, please call your pharmacy*   Lab Work:  Your physician recommends that you return for lab work in: 3 MONTHS-FASTING  If you have labs (blood work) drawn today and your tests are completely normal, you will receive your results only by: [place] (if you have MyChart) OR A paper copy in the mail If you have any lab test that is abnormal or we need to change your treatment, we will call you to review the results.   Testing/Procedures:  Your physician has requested that you have a lexiscan myoview. For further information please visit [email or web address]. Please follow instruction sheet, as given. [place]   Follow-Up: At [facility], you and your health needs are our priority.  As part of our continuing mission to provide you with exceptional heart care, we have created designated Provider Care Teams.  These Care Teams include your primary Cardiologist (physician) and Advanced Practice Providers (APPs -  Physician Assistants and Nurse Practitioners) who all work together to provide you with the care you need, when you need it.  We recommend signing up for the patient portal called "MyChart".  Sign up information is provided on this After Visit Summary.  MyChart is used to connect with patients for Virtual Visits (Telemedicine).  Patients are able to view lab/test results, encounter notes, upcoming appointments, etc.  Non-urgent messages can be sent to your provider as well.   To learn more about what you can do with MyChart, go to [email or web address].    Your next appointment:   12 month(s)  The format for your next appointment:   In Person  Provider:   Kirk Ruths, MD

## 2020-10-23 ENCOUNTER — Telehealth (HOSPITAL_COMMUNITY): Payer: Self-pay | Admitting: Radiology

## 2020-10-23 NOTE — Telephone Encounter (Signed)
Patient given detailed instructions per Myocardial Perfusion Study Information Sheet for the test on 11/05/20 at 7:30. Patient notified to arrive 15 minutes early and that it is imperative to arrive on time for appointment to keep from having the test rescheduled.  If you need to cancel or reschedule your appointment, please call the office within 24 hours of your appointment. . Patient verbalized understanding.EHK

## 2020-10-30 ENCOUNTER — Other Ambulatory Visit: Payer: Self-pay | Admitting: Neurology

## 2020-11-01 ENCOUNTER — Telehealth (HOSPITAL_COMMUNITY): Payer: Self-pay

## 2020-11-01 NOTE — Telephone Encounter (Signed)
Detailed instructions left on the patient's answering machine. Asked to call back with any questions. S.Kati Riggenbach EMTP 

## 2020-11-05 ENCOUNTER — Other Ambulatory Visit: Payer: Self-pay

## 2020-11-05 ENCOUNTER — Ambulatory Visit (HOSPITAL_COMMUNITY): Payer: 59 | Attending: Internal Medicine

## 2020-11-05 DIAGNOSIS — R110 Nausea: Secondary | ICD-10-CM

## 2020-11-05 DIAGNOSIS — I2510 Atherosclerotic heart disease of native coronary artery without angina pectoris: Secondary | ICD-10-CM | POA: Insufficient documentation

## 2020-11-05 MED ORDER — REGADENOSON 0.4 MG/5ML IV SOLN
0.4000 mg | Freq: Once | INTRAVENOUS | Status: AC
  Administered 2020-11-05: 0.4 mg via INTRAVENOUS

## 2020-11-05 MED ORDER — TECHNETIUM TC 99M TETROFOSMIN IV KIT
31.5000 | PACK | Freq: Once | INTRAVENOUS | Status: AC | PRN
  Administered 2020-11-05: 31.5 via INTRAVENOUS
  Filled 2020-11-05: qty 32

## 2020-11-05 MED ORDER — AMINOPHYLLINE 25 MG/ML IV SOLN
75.0000 mg | Freq: Once | INTRAVENOUS | Status: AC
  Administered 2020-11-05: 75 mg via INTRAVENOUS

## 2020-11-06 ENCOUNTER — Ambulatory Visit (HOSPITAL_COMMUNITY): Payer: 59 | Attending: Cardiovascular Disease

## 2020-11-06 LAB — MYOCARDIAL PERFUSION IMAGING
LV dias vol: 76 mL (ref 62–150)
LV sys vol: 30 mL
SDS: 1
SRS: 0
SSS: 1
TID: 1.03

## 2020-11-06 MED ORDER — TECHNETIUM TC 99M TETROFOSMIN IV KIT
31.7000 | PACK | Freq: Once | INTRAVENOUS | Status: AC | PRN
  Administered 2020-11-06: 31.7 via INTRAVENOUS
  Filled 2020-11-06: qty 32

## 2020-11-18 ENCOUNTER — Other Ambulatory Visit: Payer: Self-pay | Admitting: Internal Medicine

## 2020-11-19 ENCOUNTER — Other Ambulatory Visit (INDEPENDENT_AMBULATORY_CARE_PROVIDER_SITE_OTHER): Payer: 59

## 2020-11-19 ENCOUNTER — Other Ambulatory Visit: Payer: Self-pay

## 2020-11-19 ENCOUNTER — Other Ambulatory Visit: Payer: 59

## 2020-11-19 DIAGNOSIS — E785 Hyperlipidemia, unspecified: Secondary | ICD-10-CM

## 2020-11-19 LAB — LIPID PANEL
Cholesterol: 126 mg/dL (ref 0–200)
HDL: 34 mg/dL — ABNORMAL LOW (ref >39.00)
LDL Cholesterol: 62 mg/dL (ref 0–99)
NonHDL: 91.7
Total CHOL/HDL Ratio: 4
Triglycerides: 148 mg/dL (ref 0.0–149.0)
VLDL: 29.6 mg/dL (ref 0.0–40.0)

## 2020-11-19 LAB — ALT: ALT: 15 U/L (ref 0–53)

## 2020-11-19 LAB — AST: AST: 11 U/L (ref 0–37)

## 2020-11-26 ENCOUNTER — Other Ambulatory Visit: Payer: 59

## 2020-11-27 ENCOUNTER — Telehealth: Payer: Self-pay | Admitting: Cardiology

## 2020-11-27 DIAGNOSIS — Z0279 Encounter for issue of other medical certificate: Secondary | ICD-10-CM

## 2020-11-27 NOTE — Telephone Encounter (Signed)
[facility] received forms from [employer] to be completed by Dr.Crenshaw. Forms were put in providers box on 8/11.    Forms received back from provider on 8/12.  Patient was contacted and discussed patient coming into [facility] office to sign release, billing , and pay $29 forms fee.   Patient came into [facility] office 8/16 to sign forms, and pay forms fee. Forms were faxed on 8/16, patient requested a copy of forms be mailed to him.

## 2020-11-29 NOTE — Telephone Encounter (Signed)
Email sent to billing, to post payment. Once payment is posted, payment will be processed and completed.

## 2020-12-03 ENCOUNTER — Other Ambulatory Visit: Payer: Self-pay

## 2020-12-03 ENCOUNTER — Encounter: Payer: Self-pay | Admitting: Physical Medicine and Rehabilitation

## 2020-12-03 ENCOUNTER — Encounter: Payer: 59 | Attending: Physical Medicine and Rehabilitation | Admitting: Physical Medicine and Rehabilitation

## 2020-12-03 ENCOUNTER — Other Ambulatory Visit: Payer: Self-pay | Admitting: Neurology

## 2020-12-03 VITALS — BP 155/91 | HR 92 | Temp 98.1°F | Ht 74.0 in | Wt 323.6 lb

## 2020-12-03 DIAGNOSIS — M4712 Other spondylosis with myelopathy, cervical region: Secondary | ICD-10-CM | POA: Diagnosis present

## 2020-12-03 DIAGNOSIS — G894 Chronic pain syndrome: Secondary | ICD-10-CM | POA: Diagnosis present

## 2020-12-03 DIAGNOSIS — G629 Polyneuropathy, unspecified: Secondary | ICD-10-CM | POA: Diagnosis present

## 2020-12-03 DIAGNOSIS — R252 Cramp and spasm: Secondary | ICD-10-CM | POA: Insufficient documentation

## 2020-12-03 DIAGNOSIS — M5106 Intervertebral disc disorders with myelopathy, lumbar region: Secondary | ICD-10-CM | POA: Insufficient documentation

## 2020-12-03 MED ORDER — GABAPENTIN 600 MG PO TABS: 600.0000 mg | ORAL_TABLET | Freq: Three times a day (TID) | ORAL | 5 refills | Status: DC

## 2020-12-03 MED ORDER — TIZANIDINE HCL 4 MG PO TABS: 4.0000 mg | ORAL_TABLET | Freq: Four times a day (QID) | ORAL | 5 refills | Status: DC | PRN

## 2020-12-03 NOTE — Progress Notes (Signed)
Subjective:    Patient ID: [name], male    DOB: Aug 03, 1965, 55 y.o.   MRN: [id number]  HPI Pt is a 55 yr old  L handed male with hx of OSA, DM and HTN; with hx of R knee pain (no imaging) and Low back pain with B/ radiculopathy and sciatica Sx's down to calves B/L .  Also has severe spinal stenosis with complete effacement of CSF at L1/2, L L3 nerve root impingement, L4/5 advanced thecal sac compression , severe R foraminal impingement- Here for f/u on chronic pain.   Had a shot- epidural steroid injection- 1 month ago- 2nd one- epidural is already starting to wear off- ~ 2 months is much he gets out of it.   So is set up to see Dr Sherlyn Lick  Sept 2nd  Has cardiac clearance.   Ran out of Low dose Naltrexone this weekend- but really noticed a difference when ran out this weekend.   Pain goes down to 3-4/10- doesn't last the whole day only ~ 4 hours before pain kicks back in.   It's only supposed to be prescribed 1x/day unfortunately.   Prednisone dosing didn't really hlp- more the LDN helped.    B/L low back in a band across low back, down B/L buttocks- and into R leg- to the R knee.  Also R leg is numb all the way down.   Also takes methocarbemol- Was also given 4 mg q6 hours prn.  However Zanaflex works much better than Robaxin- which makes sense since has spasticity.  Stopped the Cymbalta had side effects  Pain Inventory Average Pain 7 Pain Right Now 8 My pain is constant, sharp, dull, stabbing, and aching  In the last 24 hours, has pain interfered with the following? General activity 7 Relation with others 7 Enjoyment of life 9 What TIME of day is your pain at its worst? night Sleep (in general) Poor  Pain is worse with: walking, bending, sitting, and standing Pain improves with: rest and medication Relief from Meds: 3  Family History  Problem Relation Age of Onset   Diabetes Mother    Hypertension Neg Hx    Coronary artery disease Neg Hx    Colon  cancer Neg Hx    Prostate cancer Neg Hx    Social History   Socioeconomic History   Marital status: Married    Spouse name: [name]   Number of children: 2   Years of education: Not on file   Highest education level: Not on file  Occupational History   Occupation: Therapist, art: [employer]   Occupation: Music therapist: [employer]  Tobacco Use   Smoking status: Never   Smokeless tobacco: Never  Scientific laboratory technician Use: Never used  Substance and Sexual Activity   Alcohol use: No   Drug use: No   Sexual activity: Yes  Other Topics Concern   Not on file  Social History Narrative   Remarried for the 3rd time w/ a lady for the [place]   Children:  2010 , 2012   Left handed                Social Determinants of Health   Financial Resource Strain: Not on file  Food Insecurity: Not on file  Transportation Needs: Not on file  Physical Activity: Not on file  Stress: Not on file  Social Connections: Not on file   Past Surgical  History:  Procedure Laterality Date   NO PAST SURGERIES     Past Surgical History:  Procedure Laterality Date   NO PAST SURGERIES     Past Medical History:  Diagnosis Date   DDD (degenerative disc disease), lumbar    Diabetes mellitus    Hyperlipemia    Hypertension    Obesity    OSA (obstructive sleep apnea)    on CPAP   There were no vitals taken for this visit.  Opioid Risk Score:   Fall Risk Score:  `1  Depression screen PHQ 2/9  Depression screen [facility] 2/9 09/28/2020 09/14/2020 02/10/2020 03/02/2017 07/09/2015  Decreased Interest 0 0 0 0 0  Down, Depressed, Hopeless 0 0 0 0 0  PHQ - 2 Score 0 0 0 0 0  Altered sleeping 0 - - - -  Tired, decreased energy 0 - - - -  Change in appetite 0 - - - -  Feeling bad or failure about yourself  0 - - - -  Trouble concentrating 0 - - - -  Moving slowly or fidgety/restless 0 - - - -  Suicidal thoughts 0 - - - -  PHQ-9 Score 0 - - - -    Review of  Systems  Musculoskeletal:  Positive for back pain and gait problem.       Pain in lower back, buttocks & down right leg  All other systems reviewed and are negative.     Objective:   Physical Exam Awake, alert, appropriate, not accompanied today, NAD MS: HF  5-/5 B/L; KE 5/5 on L; and 5-5 on R- doesn't appear due to pain today; DF 4+/5 on R and 5-/5 on L; PF 5/5 B/L   TTP across band in low back.   Hoffman's (+) B/L- very brisk B/L  Few beats clonus in LE's. B/L       Assessment & Plan:   Pt is a 55 yr old  L handed male with hx of OSA, DM and HTN; with hx of R knee pain (no imaging) and Low back pain with B/ radiculopathy and sciatica Sx's down to calves B/L .  Also has severe spinal stenosis with complete effacement of CSF at L1/2, L L3 nerve root impingement, L4/5 advanced thecal sac compression , severe R foraminal impingement- Here for f/u on chronic pain.   Will take over doing the Gabapentin- will switch to 600 mg tabs 3x/day- for nerve pain- sent in with 5 refills.   2. Suggest switching over to Tizanidine/Zanaflex- 4 mg every 6 hours as needed- for spasticity. Stop taking Methocarbemol- since taking Zanaflex. Sent in Rx for this.   3. Con't Low dose Naltrexone- but will try increasing dose- ? will will continue of of today  8 mg daily- since doesn't last more than ~ 4 hours. Cannot increase due to at max dose.    4. Pt is NOT able to work - of any kind, due to spasticity as well as compression of neck and back/has a Spinal cord injury - that requires surgery on BOTH before he would be able to return to work.   5. F/U in 3 months- but call if needed.   I spent a total of 31 minutes on visit- discussing inability to return to work and spasticity.

## 2020-12-03 NOTE — Patient Instructions (Addendum)
Pt is a 55 yr old  L handed male with hx of OSA, DM and HTN; with hx of R knee pain (no imaging) and Low back pain with B/ radiculopathy and sciatica Sx's down to calves B/L .  Also has severe spinal stenosis with complete effacement of CSF at L1/2, L L3 nerve root impingement, L4/5 advanced thecal sac compression , severe R foraminal impingement- Here for f/u on chronic pain.   Will take over doing the Gabapentin- will switch to 600 mg tabs 3x/day- for nerve pain- sent in with 5 refills.   2. Suggest switching over to Tizanidine/Zanaflex- 4 mg every 6 hours as needed- for spasticity. Stop taking Methocarbemol- since taking Zanaflex. Sent in Rx for this.   3. Con't Low dose Naltrexone- but will try increasing dose- ? will will continue of of today  8 mg daily- since doesn't last more than ~ 4 hours. Cannot increase due to at max dose.   4. Pt is NOT able to work - of any kind, due to spasticity as well as compression of neck and back/has a Spinal cord injury - that requires surgery on BOTH before he would be able to return to work.   5. F/U in 3 months- but call if needed.

## 2020-12-24 ENCOUNTER — Other Ambulatory Visit: Payer: Self-pay | Admitting: Neurological Surgery

## 2021-01-01 ENCOUNTER — Other Ambulatory Visit: Payer: Self-pay | Admitting: Internal Medicine

## 2021-01-07 NOTE — Progress Notes (Signed)
Surgical Instructions    Your procedure is scheduled on Friday, September 30th.  Report to [facility] Main Entrance "A" at 5:30 A.M., then check in with the Admitting office.  Call this number if you have problems the morning of surgery:  [phone]   If you have any questions prior to your surgery date call [phone]: Open Monday-Friday 8am-4pm    Remember:  Do not eat after midnight the night before your surgery  You may drink clear liquids until 4:30 AM the morning of your surgery.   Clear liquids allowed are: Water, Non-Citrus Juices (without pulp), Carbonated Beverages, Clear Tea, Black Coffee ONLY (NO MILK, CREAM OR POWDERED CREAMER of any kind), and Gatorade    Take these medicines the morning of surgery with A SIP OF WATER Gabapentin (Neurontin)  Rosuvastatin (Crestor) Naltrexone  If needed: Flonase nasal spray Methocarbamol (Robaxin) Tizanidine (Zanaflex)    Follow your surgeon's instructions on when to stop Aspirin.  If no instructions were given by your surgeon then you will need to call the office to get those instructions.    As of today, STOP taking any Aspirin (unless otherwise instructed by your surgeon) Aleve, Naproxen, Ibuprofen, Motrin, Advil, Goody's, BC's, all herbal medications, fish oil, and all vitamins.  WHAT DO I DO ABOUT MY DIABETES MEDICATION?   Do not take oral diabetes medicines (pills) the morning of surgery. - Metformin   HOW TO MANAGE YOUR DIABETES BEFORE AND AFTER SURGERY  Why is it important to control my blood sugar before and after surgery? Improving blood sugar levels before and after surgery helps healing and can limit problems. A way of improving blood sugar control is eating a healthy diet by:  Eating less sugar and carbohydrates  Increasing activity/exercise  Talking with your doctor about reaching your blood sugar goals High blood sugars (greater than 180 mg/dL) can raise your risk of infections and slow your recovery, so  you will need to focus on controlling your diabetes during the weeks before surgery. Make sure that the doctor who takes care of your diabetes knows about your planned surgery including the date and location.  How do I manage my blood sugar before surgery? Check your blood sugar at least 4 times a day, starting 2 days before surgery, to make sure that the level is not too high or low.  Check your blood sugar the morning of your surgery when you wake up and every 2 hours until you get to the Short Stay unit.  If your blood sugar is less than 70 mg/dL, you will need to treat for low blood sugar: Do not take insulin. Treat a low blood sugar (less than 70 mg/dL) with  cup of clear juice (cranberry or apple), 4 glucose tablets, OR glucose gel. Recheck blood sugar in 15 minutes after treatment (to make sure it is greater than 70 mg/dL). If your blood sugar is not greater than 70 mg/dL on recheck, call [phone] for further instructions. Report your blood sugar to the short stay nurse when you get to Short Stay.  If you are admitted to the hospital after surgery: Your blood sugar will be checked by the staff and you will probably be given insulin after surgery (instead of oral diabetes medicines) to make sure you have good blood sugar levels. The goal for blood sugar control after surgery is 80-180 mg/dL.           DAY OF SURGERY Do not wear jewelry Do not wear lotions, powders, colognes,  or deodorant. Men may shave face and neck. Do not bring valuables to the hospital.             [facility] is not responsible for any belongings or valuables.  Do NOT Smoke (Tobacco/Vaping)  24 hours prior to your procedure If you use a CPAP at night, you may bring your mask for your overnight stay.   Contacts, glasses, dentures or bridgework may not be worn into surgery, please bring cases for these belongings   For patients admitted to the hospital, discharge time will be determined by your treatment  team.   Patients discharged the day of surgery will not be allowed to drive home, and someone needs to stay with them for 24 hours.  NO VISITORS WILL BE ALLOWED IN PRE-OP WHERE PATIENTS GET READY FOR SURGERY.  ONLY 1 SUPPORT PERSON MAY BE PRESENT WHILE YOU ARE IN SURGERY.  IF YOU ARE TO BE ADMITTED, ONCE YOU ARE IN YOUR ROOM YOU WILL BE ALLOWED TWO (2) VISITORS.  Minor children may have two parents present. Special consideration for safety and communication needs will be reviewed on a case by case basis.  Special instructions:    Oral Hygiene is also important to reduce your risk of infection.  Remember - BRUSH YOUR TEETH THE MORNING OF SURGERY WITH YOUR REGULAR TOOTHPASTE   [facility]- Preparing For Surgery  Before surgery, you can play an important role. Because skin is not sterile, your skin needs to be as free of germs as possible. You can reduce the number of germs on your skin by washing with CHG (chlorahexidine gluconate) Soap before surgery.  CHG is an antiseptic cleaner which kills germs and bonds with the skin to continue killing germs even after washing.     Please do not use if you have an allergy to CHG or antibacterial soaps. If your skin becomes reddened/irritated stop using the CHG.  Do not shave (including legs and underarms) for at least 48 hours prior to first CHG shower. It is OK to shave your face.  Please follow these instructions carefully.     Shower the NIGHT BEFORE SURGERY and the MORNING OF SURGERY with CHG Soap.   If you chose to wash your hair, wash your hair first as usual with your normal shampoo. After you shampoo, rinse your hair and body thoroughly to remove the shampoo.  Then [employer] and genitals (private parts) with your normal soap and rinse thoroughly to remove soap.  After that Use CHG Soap as you would any other liquid soap. You can apply CHG directly to the skin and wash gently with a scrungie or a clean washcloth.   Apply the CHG Soap to your  body ONLY FROM THE NECK DOWN.  Do not use on open wounds or open sores. Avoid contact with your eyes, ears, mouth and genitals (private parts). Wash Face and genitals (private parts)  with your normal soap.   Wash thoroughly, paying special attention to the area where your surgery will be performed.  Thoroughly rinse your body with warm water from the neck down.  DO NOT shower/wash with your normal soap after using and rinsing off the CHG Soap.  Pat yourself dry with a CLEAN TOWEL.  Wear CLEAN PAJAMAS to bed the night before surgery  Place CLEAN SHEETS on your bed the night before your surgery  DO NOT SLEEP WITH PETS.   Day of Surgery:  Take a shower with CHG soap. Wear Clean/Comfortable clothing the  morning of surgery Do not apply any deodorants/lotions.   Remember to brush your teeth WITH YOUR REGULAR TOOTHPASTE.   Please read over the following fact sheets that you were given.

## 2021-01-08 ENCOUNTER — Encounter (HOSPITAL_COMMUNITY): Payer: Self-pay

## 2021-01-08 ENCOUNTER — Encounter (HOSPITAL_COMMUNITY)
Admission: RE | Admit: 2021-01-08 | Discharge: 2021-01-08 | Disposition: A | Discharge status: Home / Self Care | Payer: 59 | Source: Ambulatory Visit | Attending: Neurological Surgery | Admitting: Neurological Surgery

## 2021-01-08 ENCOUNTER — Other Ambulatory Visit: Payer: Self-pay

## 2021-01-08 ENCOUNTER — Encounter: Payer: Self-pay | Admitting: Internal Medicine

## 2021-01-08 DIAGNOSIS — Z79899 Other long term (current) drug therapy: Secondary | ICD-10-CM | POA: Insufficient documentation

## 2021-01-08 DIAGNOSIS — Z7984 Long term (current) use of oral hypoglycemic drugs: Secondary | ICD-10-CM | POA: Insufficient documentation

## 2021-01-08 DIAGNOSIS — Z20822 Contact with and (suspected) exposure to covid-19: Secondary | ICD-10-CM | POA: Insufficient documentation

## 2021-01-08 DIAGNOSIS — Z6841 Body Mass Index (BMI) 40.0 and over, adult: Secondary | ICD-10-CM | POA: Insufficient documentation

## 2021-01-08 DIAGNOSIS — I4510 Unspecified right bundle-branch block: Secondary | ICD-10-CM | POA: Insufficient documentation

## 2021-01-08 DIAGNOSIS — E119 Type 2 diabetes mellitus without complications: Secondary | ICD-10-CM | POA: Insufficient documentation

## 2021-01-08 DIAGNOSIS — M5416 Radiculopathy, lumbar region: Secondary | ICD-10-CM | POA: Insufficient documentation

## 2021-01-08 DIAGNOSIS — Z9989 Dependence on other enabling machines and devices: Secondary | ICD-10-CM | POA: Insufficient documentation

## 2021-01-08 DIAGNOSIS — I10 Essential (primary) hypertension: Secondary | ICD-10-CM | POA: Insufficient documentation

## 2021-01-08 DIAGNOSIS — Z01812 Encounter for preprocedural laboratory examination: Secondary | ICD-10-CM | POA: Insufficient documentation

## 2021-01-08 DIAGNOSIS — Z7982 Long term (current) use of aspirin: Secondary | ICD-10-CM | POA: Insufficient documentation

## 2021-01-08 DIAGNOSIS — E785 Hyperlipidemia, unspecified: Secondary | ICD-10-CM | POA: Insufficient documentation

## 2021-01-08 DIAGNOSIS — G4733 Obstructive sleep apnea (adult) (pediatric): Secondary | ICD-10-CM | POA: Insufficient documentation

## 2021-01-08 LAB — CBC
HCT: 45.4 % (ref 39.0–52.0)
Hemoglobin: 15.5 g/dL (ref 13.0–17.0)
MCH: 31.4 pg (ref 26.0–34.0)
MCHC: 34.1 g/dL (ref 30.0–36.0)
MCV: 91.9 fL (ref 80.0–100.0)
Platelets: 272 10*3/uL (ref 150–400)
RBC: 4.94 MIL/uL (ref 4.22–5.81)
RDW: 13 % (ref 11.5–15.5)
WBC: 7.4 10*3/uL (ref 4.0–10.5)
nRBC: 0 % (ref 0.0–0.2)

## 2021-01-08 LAB — BASIC METABOLIC PANEL
Anion gap: 10 (ref 5–15)
BUN: 28 mg/dL — ABNORMAL HIGH (ref 6–20)
CO2: 27 mmol/L (ref 22–32)
Calcium: 9.6 mg/dL (ref 8.9–10.3)
Chloride: 97 mmol/L — ABNORMAL LOW (ref 98–111)
Creatinine, Ser: 1.57 mg/dL — ABNORMAL HIGH (ref 0.61–1.24)
GFR, Estimated: 51 mL/min — ABNORMAL LOW (ref >60)
Glucose, Bld: 255 mg/dL — ABNORMAL HIGH (ref 70–99)
Potassium: 4.2 mmol/L (ref 3.5–5.1)
Sodium: 134 mmol/L — ABNORMAL LOW (ref 135–145)

## 2021-01-08 LAB — SARS CORONAVIRUS 2 (TAT 6-24 HRS): SARS Coronavirus 2: NEGATIVE

## 2021-01-08 LAB — HEMOGLOBIN A1C
Hgb A1c MFr Bld: 6.7 % — ABNORMAL HIGH (ref 4.8–5.6)
Mean Plasma Glucose: 146 mg/dL

## 2021-01-08 LAB — TYPE AND SCREEN
ABO/RH(D): O POS
Antibody Screen: NEGATIVE

## 2021-01-08 LAB — GLUCOSE, CAPILLARY: Glucose-Capillary: 253 mg/dL — ABNORMAL HIGH (ref 70–99)

## 2021-01-08 LAB — SURGICAL PCR SCREEN
MRSA, PCR: NEGATIVE
Staphylococcus aureus: POSITIVE — AB

## 2021-01-08 NOTE — Progress Notes (Signed)
PCP - [facility] Cardiologist - Dr. Stanford Breed See note from office 10/22/20 Scan for Myocardial Perfusion performed on 11/05/20 Scan stated to be normal - may proceed with surgery, per Dr. Stanford Breed (see procedure note 11/06/20)  Chest x-ray - n/a EKG - 10/22/20 Myocardial Perfusion scan - 11/05/20  SA - yes, wears CPAP  DM - Type 2 Fasting Blood Sugar - 145-154   Aspirin Instructions: follow your surgeon's instructions Last dose of ASA - 01/05/21  ERAS Protcol - yes, no drink ordered or given   COVID TEST- 01/08/21 (surgery admit)   Anesthesia review: yes, heart history, abnormal EKG Received cardiac clearance 11/06/20 (see above note) CBG @ PAT appointment - 253 BP @ PAT 93/60  James notified.  Will check A1c.    Patient denies shortness of breath, fever, cough and chest pain at PAT appointment   All instructions explained to the patient, with a verbal understanding of the material. Patient agrees to go over the instructions while at home for a better understanding. Patient also instructed to self quarantine after being tested for COVID-19. The opportunity to ask questions was provided.

## 2021-01-09 MED ORDER — ONETOUCH VERIO VI STRP: ORAL_STRIP | 12 refills | Status: DC

## 2021-01-09 MED ORDER — ONETOUCH DELICA LANCETS 33G MISC: 12 refills | Status: DC

## 2021-01-09 NOTE — Progress Notes (Signed)
Anesthesia Chart Review:  Case: [id number] Date/Time: 01/11/21 0715   Procedures:      Lumbar 1-2, Lumbar 2-3, Lumbar 3-4, Lumbar 4-5, Lumbar 5 Sacral 1 Open decompression, Transforaminal lumbar interbody fusion, posterolateral instrumented fusion     APPLICATION OF ROBOTIC ASSISTANCE FOR SPINAL PROCEDURE     APPLICATION OF CELL SAVER   Anesthesia type: General   Pre-op diagnosis: Lumbar radiculopathy   Location: [facility] OR ROOM 58 / [place] [facility]   Surgeons: Judith Part, MD       DISCUSSION: Patient is a 55 year old male scheduled for the above procedure.  He may also require cervical disc surgery and knee surgery in the future.  History includes never smoker, DM2, HLD, HTN, OSA (uses CPAP).  BMI is consistent with morbid obesity.  He had preoperative visit with Dr. [name] on 10/08/20. He referred patient for preoperative cardiology evaluation due to CAD risk factors and tachycardia, RBBB. He was seen by cardiologist Dr. Stanford Breed on 10/22/2020.  A Lexiscan nuclear study was ordered and done on 11/06/2020.  It was nonischemic.  Per Dr. Stanford Breed, "ok for surgery".  BP 93/60 at PAT. Previous SBP readings in [facility] ~ 140-150. 01/09/21 Creatinine 1.57, previously baseline in the ~ 1.2 range. BUN 28. I called and spoke with [name]. He denied symptoms of hypotension such as dizziness or SOB. He says that his BP intermittently runs higher and lower, possibly related to peaks in his BP and pain medications. He also reported intentional weight loss, as he is trying to lose weight so he can be approved for TKA in the future. His wife works with [employer] and is able to monitor his BP. Advised he contact Dr. [name] if BP trends are consistently lower. His glucose at PAT was 255, but said he had just eaten prior to his appointment. A1c 6.7%. Advised he stay hydrated.Marland Kitchen His losartan-HCTZ and metformin will be held the day of surgery. Renal function can be followed post-operatively. Also advised he have Dr. [name] review pre- and  post-op labs at his 01/21/21 visit to ensure stability of renal function.     Last aspirin 01/05/2021. 01/08/21 presurgical COVID-19 test negative. Anesthesia team to evaluate on the day of surgery.    VS: BP 93/60   Pulse 96   Temp 36.6 C   Resp 18   Ht '6\' 2"'  (1.88 m)   Wt (!) 142.2 kg   SpO2 98%   BMI 40.26 kg/m  BP Readings from Last 3 Encounters:  01/08/21 93/60  12/03/20 (!) 155/91  10/22/20 140/78     PROVIDERS: Colon Branch, MD is PCP Kirk Ruths, MD is cardiologist   LABS: Preoperative labs noted. Cr 1.57, previously 1.22-1.24 since 01/2020. See DISCUSSION. (all labs ordered are listed, but only abnormal results are displayed)  Labs Reviewed  SURGICAL PCR SCREEN - Abnormal; Notable for the following components:      Result Value   Staphylococcus aureus POSITIVE (*)    All other components within normal limits  HEMOGLOBIN A1C - Abnormal; Notable for the following components:   Hgb A1c MFr Bld 6.7 (*)    All other components within normal limits  BASIC METABOLIC PANEL - Abnormal; Notable for the following components:   Sodium 134 (*)    Chloride 97 (*)    Glucose, Bld 255 (*)    BUN 28 (*)    Creatinine, Ser 1.57 (*)    GFR, Estimated 51 (*)    All other components within normal limits  GLUCOSE, CAPILLARY - Abnormal; Notable for the following components:   Glucose-Capillary 253 (*)    All other components within normal limits  SARS CORONAVIRUS 2 (TAT 6-24 HRS)  CBC  TYPE AND SCREEN    Home sleep test 07/24/2020: Impressions: Severe obstructive sleep apnea. AHI of 36.0/hour of sleep. Mild oxygen desaturations. Recommendations: Recommend CPAP therapy for severe obstructive sleep apnea.  Auto titrating CPAP with pressure setting of 5-20 will be appropriate...   IMAGES: CT L-spine 10/19/20: IMPRESSION: 1. Treatment planning CT which redemonstrates severe spinal degeneration exacerbated by short pedicles and epidural lipomatosis. 2. Ankylosis from  bridging intervertebral osteophytes at T10-L1.  MRI C-spine 08/11/20: IMPRESSION: 1. Multilevel cervical spondylosis with resultant mild to moderate diffuse spinal stenosis at C3-4 through C6-7, most pronounced at C4-5. 2. Subtle foci of cord signal abnormality within the cord at the levels of C4-5 and C5-6, suspicious for myelomalacia. 3. Multifactorial degenerative changes with resultant multilevel foraminal narrowing as above. Notable findings include severe left with moderate right C4 foraminal stenosis, severe bilateral C5 and C6 foraminal narrowing, with moderate left worse than right C7 foraminal stenosis.  MRI L-spine 06/23/20: IMPRESSION: 1. Advanced and generalized lumbar spine degeneration exacerbated by short pedicles and epidural fat. 2. Advanced and compressive thecal sac stenosis from L1-2 to L4-5. 3. Advanced foraminal impingement on the right at L5-S1 which may have resulted in chronic structural changes to the right extraforaminal L5 nerve root. 4. On the left is advanced foraminal impingement at L3-4 and L4-5.      EKG: 10/22/20: Sinus tachycardia 103 bpm Possible left atrial enlargement Right bundle branch block   CV: Nuclear stress test 11/06/20: Nuclear stress EF: 61%. The left ventricular ejection fraction is normal (55-65%). This is a low risk study. No evidence of ischemia. No evidence of previous infarction The study is normal.    Past Medical History:  Diagnosis Date   DDD (degenerative disc disease), lumbar    Diabetes mellitus    Hyperlipemia    Hypertension    Obesity    OSA (obstructive sleep apnea)    on CPAP    Past Surgical History:  Procedure Laterality Date   COLONOSCOPY  2019   NO PAST SURGERIES      MEDICATIONS:  aspirin 81 MG tablet   blood glucose meter kit and supplies   Blood Glucose Monitoring Suppl (ONETOUCH VERIO FLEX SYSTEM) w/Device KIT   etodolac (LODINE) 500 MG tablet   fluticasone (FLONASE) 50 MCG/ACT nasal  spray   gabapentin (NEURONTIN) 300 MG capsule   gabapentin (NEURONTIN) 600 MG tablet   glucose blood (ONETOUCH VERIO) test strip   losartan-hydrochlorothiazide (HYZAAR) 100-12.5 MG tablet   metFORMIN (GLUCOPHAGE) 1000 MG tablet   methocarbamol (ROBAXIN) 500 MG tablet   NALTREXONE HCL PO   OneTouch Delica Lancets 86P MISC   rosuvastatin (CRESTOR) 20 MG tablet   tadalafil (CIALIS) 20 MG tablet   tiZANidine (ZANAFLEX) 4 MG tablet   No current facility-administered medications for this encounter.    Myra Gianotti, PA-C Surgical Short Stay/Anesthesiology [facility] Phone [phone] [facility] Phone [phone] 01/10/2021 9:46 AM

## 2021-01-10 MED ORDER — VANCOMYCIN HCL 1500 MG/300ML IV SOLN
1500.0000 mg | INTRAVENOUS | Status: AC
  Administered 2021-01-11: 1500 mg via INTRAVENOUS
  Filled 2021-01-10 (×2): qty 300

## 2021-01-10 NOTE — Anesthesia Preprocedure Evaluation (Addendum)
Anesthesia Evaluation  Patient identified by MRN, date of birth, ID band Patient awake    Reviewed: Allergy & Precautions, NPO status , Patient's Chart, lab work & pertinent test results  Airway Mallampati: III  TM Distance: >3 FB Neck ROM: Full    Dental  (+) Edentulous Upper   Pulmonary sleep apnea and Continuous Positive Airway Pressure Ventilation ,    Pulmonary exam normal        Cardiovascular Exercise Tolerance: Good hypertension,  Rhythm:Regular Rate:Tachycardia  10/2020: Nuclear stress EF: 61%. The left ventricular ejection fraction is normal (55-65%). ? This is a low risk study. No evidence of ischemia. No evidence of previous infarction ? The study is normal   EKG reviewed: ST, RBBB   Neuro/Psych negative neurological ROS  negative psych ROS   GI/Hepatic negative GI ROS,   Endo/Other  diabetes, Poorly Controlled, Type 2, Oral Hypoglycemic AgentsMorbid obesity  Renal/GU ARF and Renal InsufficiencyRenal disease  negative genitourinary   Musculoskeletal  (+) Arthritis , Osteoarthritis,    Abdominal (+) + obese,   Peds negative pediatric ROS (+)  Hematology   Anesthesia Other Findings   Reproductive/Obstetrics negative OB ROS                           Anesthesia Physical Anesthesia Plan  ASA: 3  Anesthesia Plan: General   Post-op Pain Management:    Induction: Intravenous  PONV Risk Score and Plan: 2 and Treatment may vary due to age or medical condition  Airway Management Planned: Oral ETT  Additional Equipment: Arterial line  Intra-op Plan:   Post-operative Plan: Extubation in OR  Informed Consent: I have reviewed the patients History and Physical, chart, labs and discussed the procedure including the risks, benefits and alternatives for the proposed anesthesia with the patient or authorized representative who has indicated his/her understanding and acceptance.      Dental advisory given  Plan Discussed with: CRNA and Anesthesiologist  Anesthesia Plan Comments: (Discussed with patient that he is at high risk for postop renal dysfunction given his increasing creatinine. Patient states he doesn't drink much. I advised his kidney function is likely to worsen after such a long surgery and the potential consequences of that. He expressed understanding and still wishes to proceed with surgery today. Norton Blizzard, MD    PAT note written by Myra Gianotti, PA-C. )      Anesthesia Quick Evaluation

## 2021-01-11 ENCOUNTER — Inpatient Hospital Stay (HOSPITAL_COMMUNITY): Payer: 59 | Admitting: Anesthesiology

## 2021-01-11 ENCOUNTER — Other Ambulatory Visit: Payer: Self-pay

## 2021-01-11 ENCOUNTER — Inpatient Hospital Stay (HOSPITAL_COMMUNITY): Payer: 59 | Admitting: Physician Assistant

## 2021-01-11 ENCOUNTER — Inpatient Hospital Stay (HOSPITAL_COMMUNITY): Payer: 59

## 2021-01-11 ENCOUNTER — Encounter (HOSPITAL_COMMUNITY): Payer: Self-pay | Admitting: Neurological Surgery

## 2021-01-11 ENCOUNTER — Inpatient Hospital Stay (HOSPITAL_COMMUNITY)
Admission: RE | Admit: 2021-01-11 | Discharge: 2021-01-18 | DRG: 454 | Disposition: A | Discharge status: Rehabilitation Facility | Payer: 59 | Attending: Neurological Surgery | Admitting: Neurological Surgery

## 2021-01-11 ENCOUNTER — Encounter (HOSPITAL_COMMUNITY)
Admission: RE | Disposition: A | Discharge status: Rehabilitation Facility | Payer: Self-pay | Source: Home / Self Care | Attending: Neurological Surgery

## 2021-01-11 DIAGNOSIS — E1165 Type 2 diabetes mellitus with hyperglycemia: Secondary | ICD-10-CM | POA: Diagnosis present

## 2021-01-11 DIAGNOSIS — S21209A Unspecified open wound of unspecified back wall of thorax without penetration into thoracic cavity, initial encounter: Secondary | ICD-10-CM | POA: Diagnosis not present

## 2021-01-11 DIAGNOSIS — M4726 Other spondylosis with radiculopathy, lumbar region: Secondary | ICD-10-CM | POA: Diagnosis present

## 2021-01-11 DIAGNOSIS — E8809 Other disorders of plasma-protein metabolism, not elsewhere classified: Secondary | ICD-10-CM | POA: Diagnosis not present

## 2021-01-11 DIAGNOSIS — N1831 Chronic kidney disease, stage 3a: Secondary | ICD-10-CM | POA: Diagnosis present

## 2021-01-11 DIAGNOSIS — I959 Hypotension, unspecified: Secondary | ICD-10-CM | POA: Diagnosis not present

## 2021-01-11 DIAGNOSIS — E669 Obesity, unspecified: Secondary | ICD-10-CM | POA: Diagnosis not present

## 2021-01-11 DIAGNOSIS — Z7984 Long term (current) use of oral hypoglycemic drugs: Secondary | ICD-10-CM | POA: Diagnosis not present

## 2021-01-11 DIAGNOSIS — T8141XA Infection following a procedure, superficial incisional surgical site, initial encounter: Secondary | ICD-10-CM | POA: Diagnosis not present

## 2021-01-11 DIAGNOSIS — E1122 Type 2 diabetes mellitus with diabetic chronic kidney disease: Secondary | ICD-10-CM | POA: Diagnosis present

## 2021-01-11 DIAGNOSIS — Z833 Family history of diabetes mellitus: Secondary | ICD-10-CM | POA: Diagnosis not present

## 2021-01-11 DIAGNOSIS — I129 Hypertensive chronic kidney disease with stage 1 through stage 4 chronic kidney disease, or unspecified chronic kidney disease: Secondary | ICD-10-CM | POA: Diagnosis present

## 2021-01-11 DIAGNOSIS — I10 Essential (primary) hypertension: Secondary | ICD-10-CM | POA: Diagnosis not present

## 2021-01-11 DIAGNOSIS — M4646 Discitis, unspecified, lumbar region: Secondary | ICD-10-CM | POA: Diagnosis not present

## 2021-01-11 DIAGNOSIS — E876 Hypokalemia: Secondary | ICD-10-CM | POA: Diagnosis not present

## 2021-01-11 DIAGNOSIS — E871 Hypo-osmolality and hyponatremia: Secondary | ICD-10-CM | POA: Diagnosis present

## 2021-01-11 DIAGNOSIS — E46 Unspecified protein-calorie malnutrition: Secondary | ICD-10-CM | POA: Diagnosis not present

## 2021-01-11 DIAGNOSIS — E1169 Type 2 diabetes mellitus with other specified complication: Secondary | ICD-10-CM | POA: Diagnosis not present

## 2021-01-11 DIAGNOSIS — G062 Extradural and subdural abscess, unspecified: Secondary | ICD-10-CM | POA: Diagnosis not present

## 2021-01-11 DIAGNOSIS — Z20822 Contact with and (suspected) exposure to covid-19: Secondary | ICD-10-CM | POA: Diagnosis present

## 2021-01-11 DIAGNOSIS — Z4789 Encounter for other orthopedic aftercare: Secondary | ICD-10-CM | POA: Diagnosis not present

## 2021-01-11 DIAGNOSIS — L7634 Postprocedural seroma of skin and subcutaneous tissue following other procedure: Secondary | ICD-10-CM | POA: Diagnosis not present

## 2021-01-11 DIAGNOSIS — Z6841 Body Mass Index (BMI) 40.0 and over, adult: Secondary | ICD-10-CM | POA: Diagnosis not present

## 2021-01-11 DIAGNOSIS — N179 Acute kidney failure, unspecified: Secondary | ICD-10-CM | POA: Diagnosis not present

## 2021-01-11 DIAGNOSIS — M48061 Spinal stenosis, lumbar region without neurogenic claudication: Secondary | ICD-10-CM | POA: Diagnosis present

## 2021-01-11 DIAGNOSIS — G4733 Obstructive sleep apnea (adult) (pediatric): Secondary | ICD-10-CM | POA: Diagnosis present

## 2021-01-11 DIAGNOSIS — E785 Hyperlipidemia, unspecified: Secondary | ICD-10-CM | POA: Diagnosis present

## 2021-01-11 DIAGNOSIS — G8929 Other chronic pain: Secondary | ICD-10-CM | POA: Diagnosis present

## 2021-01-11 DIAGNOSIS — T8149XA Infection following a procedure, other surgical site, initial encounter: Secondary | ICD-10-CM | POA: Diagnosis not present

## 2021-01-11 DIAGNOSIS — E872 Acidosis, unspecified: Secondary | ICD-10-CM | POA: Diagnosis present

## 2021-01-11 DIAGNOSIS — B961 Klebsiella pneumoniae [K. pneumoniae] as the cause of diseases classified elsewhere: Secondary | ICD-10-CM | POA: Diagnosis not present

## 2021-01-11 DIAGNOSIS — M5136 Other intervertebral disc degeneration, lumbar region: Secondary | ICD-10-CM | POA: Diagnosis present

## 2021-01-11 DIAGNOSIS — M5416 Radiculopathy, lumbar region: Secondary | ICD-10-CM | POA: Diagnosis present

## 2021-01-11 DIAGNOSIS — M5106 Intervertebral disc disorders with myelopathy, lumbar region: Secondary | ICD-10-CM | POA: Diagnosis not present

## 2021-01-11 DIAGNOSIS — M26212 Malocclusion, Angle's class II: Secondary | ICD-10-CM | POA: Diagnosis not present

## 2021-01-11 DIAGNOSIS — Z419 Encounter for procedure for purposes other than remedying health state, unspecified: Secondary | ICD-10-CM

## 2021-01-11 DIAGNOSIS — K6812 Psoas muscle abscess: Secondary | ICD-10-CM | POA: Diagnosis not present

## 2021-01-11 DIAGNOSIS — D62 Acute posthemorrhagic anemia: Secondary | ICD-10-CM | POA: Diagnosis not present

## 2021-01-11 DIAGNOSIS — K5901 Slow transit constipation: Secondary | ICD-10-CM | POA: Diagnosis not present

## 2021-01-11 DIAGNOSIS — R7881 Bacteremia: Secondary | ICD-10-CM | POA: Diagnosis not present

## 2021-01-11 DIAGNOSIS — G8918 Other acute postprocedural pain: Secondary | ICD-10-CM | POA: Diagnosis not present

## 2021-01-11 HISTORY — PX: APPLICATION OF ROBOTIC ASSISTANCE FOR SPINAL PROCEDURE: SHX6753

## 2021-01-11 HISTORY — PX: TRANSFORAMINAL LUMBAR INTERBODY FUSION (TLIF) WITH PEDICLE SCREW FIXATION 4 LEVEL: SHX6144

## 2021-01-11 LAB — CBC
HCT: 39.7 % (ref 39.0–52.0)
Hemoglobin: 13.7 g/dL (ref 13.0–17.0)
MCH: 31 pg (ref 26.0–34.0)
MCHC: 34.5 g/dL (ref 30.0–36.0)
MCV: 89.8 fL (ref 80.0–100.0)
Platelets: 296 10*3/uL (ref 150–400)
RBC: 4.42 MIL/uL (ref 4.22–5.81)
RDW: 12.5 % (ref 11.5–15.5)
WBC: 24 10*3/uL — ABNORMAL HIGH (ref 4.0–10.5)
nRBC: 0 % (ref 0.0–0.2)

## 2021-01-11 LAB — GLUCOSE, CAPILLARY
Glucose-Capillary: 152 mg/dL — ABNORMAL HIGH (ref 70–99)
Glucose-Capillary: 213 mg/dL — ABNORMAL HIGH (ref 70–99)
Glucose-Capillary: 283 mg/dL — ABNORMAL HIGH (ref 70–99)

## 2021-01-11 LAB — ABO/RH: ABO/RH(D): O POS

## 2021-01-11 IMAGING — RF DG LUMBAR SPINE 2-3V
1 series · 2 of 2 positions shown · non-contrast
Comparison: CT lumbar spine [DATE].

CLINICAL DATA: L1-S1 posterior fusion.

EXAM:
LUMBAR SPINE - 2-3 VIEW

[Series 1: run · 2 of 2 slices shown]
[im 1/2]
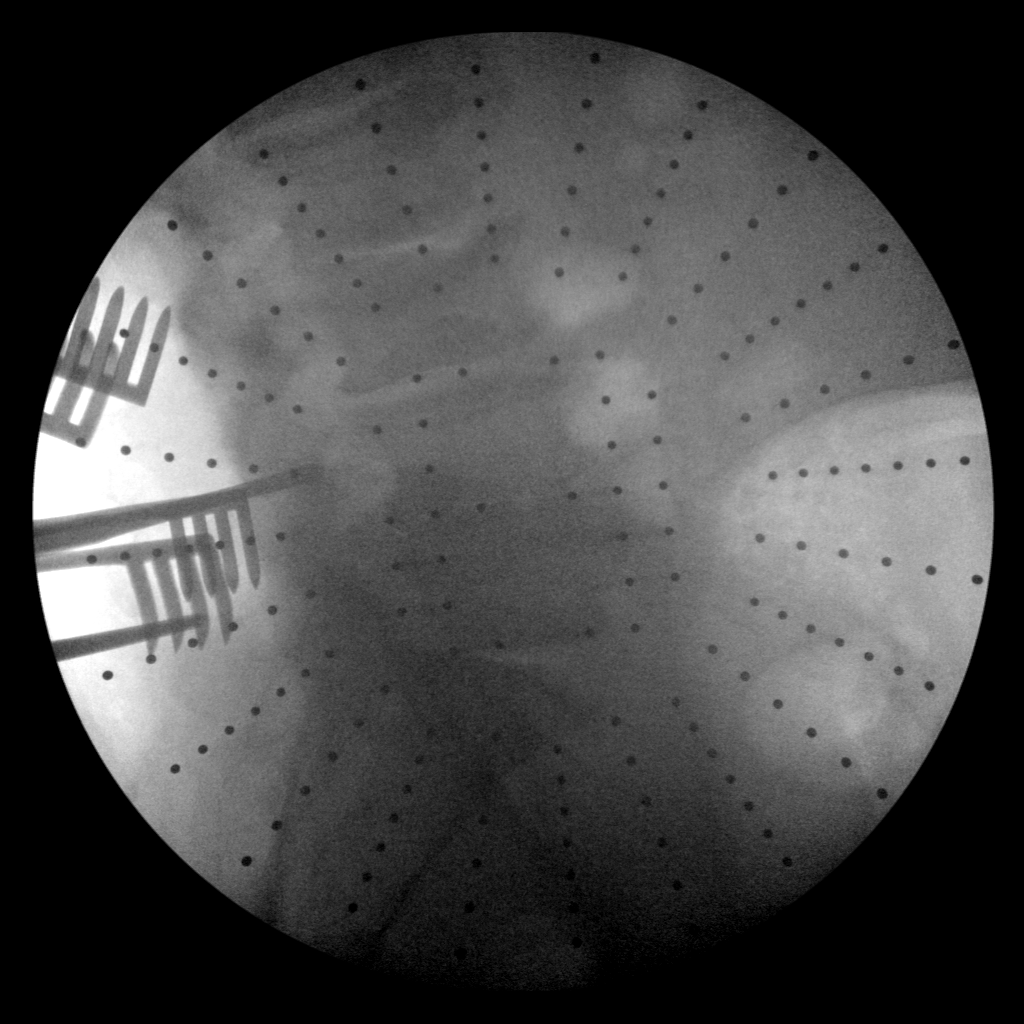
[im 2/2]
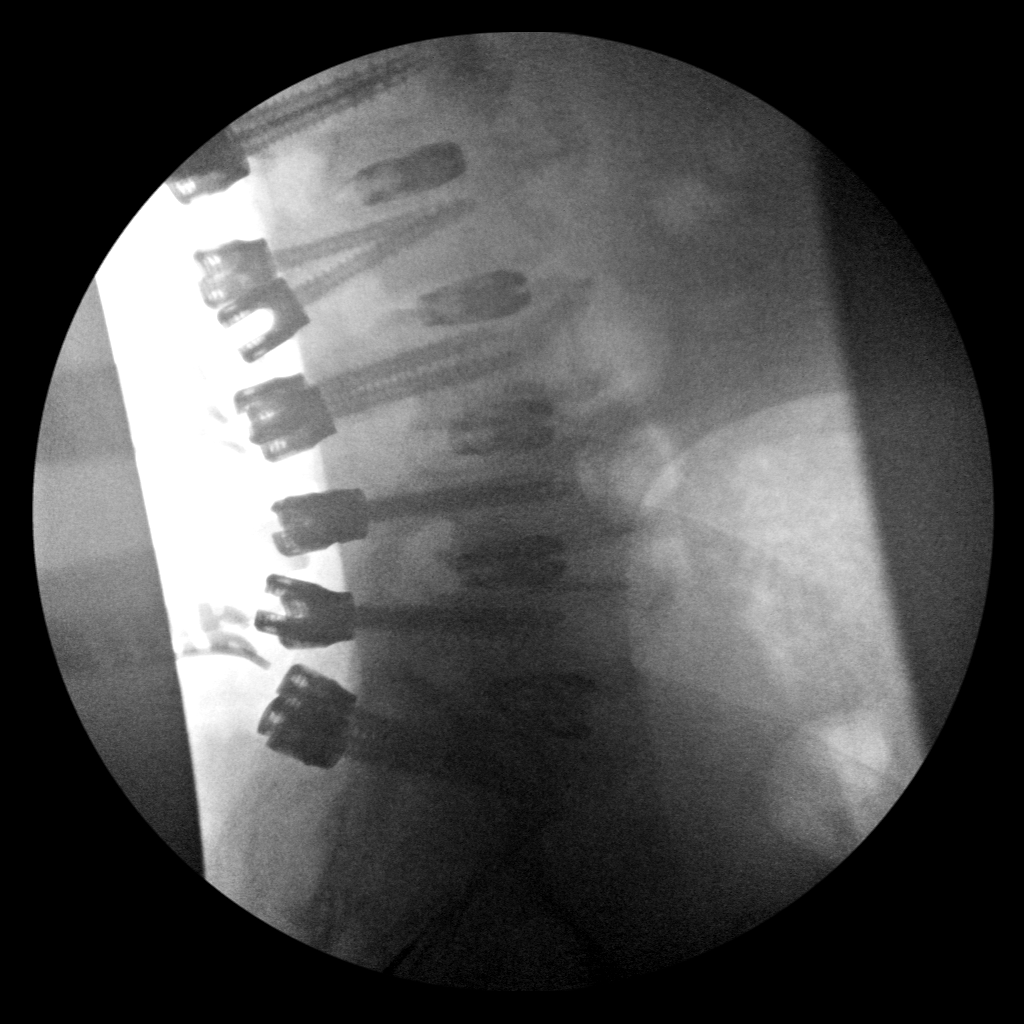

[2 of 2 positions shown; findings below may reference images not displayed]

FINDINGS: Two intraoperative fluoroscopic views of the lumbar spine during
L1-S1 posterior fusion.

Fluoroscopy time 2 minutes 22 seconds.
IMPRESSION: Intraoperative fluoroscopy as above.

## 2021-01-11 SURGERY — TRANSFORAMINAL LUMBAR INTERBODY FUSION (TLIF) WITH PEDICLE SCREW FIXATION 4 LEVEL
Anesthesia: General | Site: Spine Lumbar

## 2021-01-11 MED ORDER — THROMBIN 5000 UNITS EX SOLR: OROMUCOSAL | Status: DC | PRN

## 2021-01-11 MED ORDER — METHOCARBAMOL 500 MG PO TABS
500.0000 mg | ORAL_TABLET | Freq: Four times a day (QID) | ORAL | Status: DC | PRN
  Administered 2021-01-14 – 2021-01-18 (×10): 500 mg via ORAL
  Filled 2021-01-11 (×11): qty 1

## 2021-01-11 MED ORDER — MIDAZOLAM HCL 2 MG/2ML IJ SOLN
INTRAMUSCULAR | Status: AC
  Filled 2021-01-11: qty 2

## 2021-01-11 MED ORDER — OXYCODONE HCL 5 MG PO TABS
5.0000 mg | ORAL_TABLET | ORAL | Status: DC | PRN
  Administered 2021-01-12 – 2021-01-14 (×8): 5 mg via ORAL
  Filled 2021-01-11 (×11): qty 1

## 2021-01-11 MED ORDER — PHENOL 1.4 % MT LIQD: 1.0000 | OROMUCOSAL | Status: DC | PRN

## 2021-01-11 MED ORDER — ACETAMINOPHEN 325 MG PO TABS
650.0000 mg | ORAL_TABLET | ORAL | Status: DC | PRN
  Administered 2021-01-12 – 2021-01-13 (×4): 650 mg via ORAL
  Filled 2021-01-11 (×4): qty 2

## 2021-01-11 MED ORDER — THROMBIN 5000 UNITS EX SOLR
CUTANEOUS | Status: AC
  Filled 2021-01-11: qty 5000

## 2021-01-11 MED ORDER — SODIUM CHLORIDE 0.9 % IV BOLUS: 500.0000 mL | Freq: Once | INTRAVENOUS | Status: DC

## 2021-01-11 MED ORDER — GABAPENTIN 600 MG PO TABS
600.0000 mg | ORAL_TABLET | Freq: Three times a day (TID) | ORAL | Status: DC
  Administered 2021-01-11 – 2021-01-18 (×20): 600 mg via ORAL
  Filled 2021-01-11 (×20): qty 1

## 2021-01-11 MED ORDER — CEFAZOLIN SODIUM-DEXTROSE 2-4 GM/100ML-% IV SOLN
2.0000 g | Freq: Three times a day (TID) | INTRAVENOUS | Status: AC
Start: 2021-01-11 — End: 2021-01-12
  Administered 2021-01-11 – 2021-01-12 (×2): 2 g via INTRAVENOUS
  Filled 2021-01-11 (×2): qty 100

## 2021-01-11 MED ORDER — ONDANSETRON HCL 4 MG/2ML IJ SOLN
INTRAMUSCULAR | Status: AC
  Filled 2021-01-11: qty 2

## 2021-01-11 MED ORDER — SODIUM CHLORIDE 0.9% FLUSH
3.0000 mL | Freq: Two times a day (BID) | INTRAVENOUS | Status: DC
  Administered 2021-01-12 – 2021-01-18 (×12): 3 mL via INTRAVENOUS

## 2021-01-11 MED ORDER — ONDANSETRON HCL 4 MG/2ML IJ SOLN
INTRAMUSCULAR | Status: DC | PRN
  Administered 2021-01-11: 4 mg via INTRAVENOUS

## 2021-01-11 MED ORDER — LACTATED RINGERS IV BOLUS
500.0000 mL | Freq: Once | INTRAVENOUS | Status: AC
  Administered 2021-01-11: 500 mL via INTRAVENOUS

## 2021-01-11 MED ORDER — LIDOCAINE 2% (20 MG/ML) 5 ML SYRINGE
INTRAMUSCULAR | Status: AC
  Filled 2021-01-11: qty 5

## 2021-01-11 MED ORDER — FENTANYL CITRATE (PF) 250 MCG/5ML IJ SOLN
INTRAMUSCULAR | Status: AC
  Filled 2021-01-11: qty 5

## 2021-01-11 MED ORDER — OXYCODONE HCL 5 MG PO TABS
5.0000 mg | ORAL_TABLET | Freq: Once | ORAL | Status: DC | PRN
Start: 2021-01-11 — End: 2021-01-11

## 2021-01-11 MED ORDER — METOPROLOL TARTRATE 5 MG/5ML IV SOLN
2.5000 mg | Freq: Once | INTRAVENOUS | Status: AC
  Administered 2021-01-11: 2.5 mg via INTRAVENOUS

## 2021-01-11 MED ORDER — TIZANIDINE HCL 4 MG PO TABS
4.0000 mg | ORAL_TABLET | Freq: Four times a day (QID) | ORAL | Status: DC | PRN
  Administered 2021-01-11 – 2021-01-18 (×10): 4 mg via ORAL
  Filled 2021-01-11 (×10): qty 1

## 2021-01-11 MED ORDER — ACETAMINOPHEN 500 MG PO TABS
1000.0000 mg | ORAL_TABLET | Freq: Once | ORAL | Status: AC
  Administered 2021-01-11: 1000 mg via ORAL
  Filled 2021-01-11: qty 2

## 2021-01-11 MED ORDER — 0.9 % SODIUM CHLORIDE (POUR BTL) OPTIME
TOPICAL | Status: DC | PRN
  Administered 2021-01-11: 1000 mL

## 2021-01-11 MED ORDER — MIDAZOLAM HCL 2 MG/2ML IJ SOLN
INTRAMUSCULAR | Status: DC | PRN
  Administered 2021-01-11: 2 mg via INTRAVENOUS

## 2021-01-11 MED ORDER — PROPOFOL 10 MG/ML IV BOLUS
INTRAVENOUS | Status: DC | PRN
  Administered 2021-01-11: 160 mg via INTRAVENOUS

## 2021-01-11 MED ORDER — ROSUVASTATIN CALCIUM 20 MG PO TABS
20.0000 mg | ORAL_TABLET | Freq: Every day | ORAL | Status: DC
  Administered 2021-01-12 – 2021-01-13 (×2): 20 mg via ORAL
  Filled 2021-01-11 (×2): qty 1

## 2021-01-11 MED ORDER — METOPROLOL TARTRATE 5 MG/5ML IV SOLN
INTRAVENOUS | Status: AC
  Filled 2021-01-11: qty 5

## 2021-01-11 MED ORDER — LACTATED RINGERS IV SOLN: INTRAVENOUS | Status: DC

## 2021-01-11 MED ORDER — CHLORHEXIDINE GLUCONATE CLOTH 2 % EX PADS: 6.0000 | MEDICATED_PAD | Freq: Once | CUTANEOUS | Status: DC

## 2021-01-11 MED ORDER — SUGAMMADEX SODIUM 200 MG/2ML IV SOLN
INTRAVENOUS | Status: DC | PRN
  Administered 2021-01-11: 600 mg via INTRAVENOUS

## 2021-01-11 MED ORDER — ACETAMINOPHEN 10 MG/ML IV SOLN
INTRAVENOUS | Status: DC | PRN
  Administered 2021-01-11: 1000 mg via INTRAVENOUS

## 2021-01-11 MED ORDER — ROCURONIUM BROMIDE 10 MG/ML (PF) SYRINGE
PREFILLED_SYRINGE | INTRAVENOUS | Status: DC | PRN
  Administered 2021-01-11: 50 mg via INTRAVENOUS
  Administered 2021-01-11: 20 mg via INTRAVENOUS
  Administered 2021-01-11: 30 mg via INTRAVENOUS
  Administered 2021-01-11 (×2): 20 mg via INTRAVENOUS
  Administered 2021-01-11 (×3): 50 mg via INTRAVENOUS

## 2021-01-11 MED ORDER — BUPIVACAINE HCL (PF) 0.5 % IJ SOLN
INTRAMUSCULAR | Status: DC | PRN
  Administered 2021-01-11: 5 mL

## 2021-01-11 MED ORDER — FENTANYL CITRATE (PF) 250 MCG/5ML IJ SOLN
INTRAMUSCULAR | Status: DC | PRN
  Administered 2021-01-11 (×11): 50 ug via INTRAVENOUS
  Administered 2021-01-11: 100 ug via INTRAVENOUS

## 2021-01-11 MED ORDER — ONDANSETRON HCL 4 MG/2ML IJ SOLN
4.0000 mg | Freq: Four times a day (QID) | INTRAMUSCULAR | Status: DC | PRN
  Administered 2021-01-13: 4 mg via INTRAVENOUS
  Filled 2021-01-11: qty 2

## 2021-01-11 MED ORDER — DEXAMETHASONE SODIUM PHOSPHATE 10 MG/ML IJ SOLN
INTRAMUSCULAR | Status: AC
  Filled 2021-01-11: qty 1

## 2021-01-11 MED ORDER — LIDOCAINE-EPINEPHRINE 1 %-1:100000 IJ SOLN
INTRAMUSCULAR | Status: DC | PRN
  Administered 2021-01-11: 5 mL

## 2021-01-11 MED ORDER — PROPOFOL 10 MG/ML IV BOLUS
INTRAVENOUS | Status: AC
  Filled 2021-01-11: qty 20

## 2021-01-11 MED ORDER — SODIUM CHLORIDE 0.9 % IV SOLN: INTRAVENOUS | Status: DC | PRN

## 2021-01-11 MED ORDER — ONDANSETRON HCL 4 MG PO TABS: 4.0000 mg | ORAL_TABLET | Freq: Four times a day (QID) | ORAL | Status: DC | PRN

## 2021-01-11 MED ORDER — ALBUMIN HUMAN 5 % IV SOLN: INTRAVENOUS | Status: DC | PRN

## 2021-01-11 MED ORDER — HYDROMORPHONE HCL 1 MG/ML IJ SOLN
INTRAMUSCULAR | Status: AC
  Administered 2021-01-11: 0.5 mg via INTRAVENOUS
  Filled 2021-01-11: qty 1

## 2021-01-11 MED ORDER — OXYCODONE HCL 5 MG PO TABS
10.0000 mg | ORAL_TABLET | ORAL | Status: DC | PRN
  Administered 2021-01-11 – 2021-01-18 (×16): 10 mg via ORAL
  Filled 2021-01-11 (×17): qty 2

## 2021-01-11 MED ORDER — DEXMEDETOMIDINE (PRECEDEX) IN NS 20 MCG/5ML (4 MCG/ML) IV SYRINGE
PREFILLED_SYRINGE | INTRAVENOUS | Status: AC
  Filled 2021-01-11: qty 5

## 2021-01-11 MED ORDER — MORPHINE SULFATE (PF) 4 MG/ML IV SOLN
4.0000 mg | INTRAVENOUS | Status: DC | PRN
  Administered 2021-01-11 – 2021-01-17 (×15): 4 mg via INTRAVENOUS
  Filled 2021-01-11 (×15): qty 1

## 2021-01-11 MED ORDER — SUCCINYLCHOLINE CHLORIDE 200 MG/10ML IV SOSY
PREFILLED_SYRINGE | INTRAVENOUS | Status: DC | PRN
  Administered 2021-01-11: 160 mg via INTRAVENOUS

## 2021-01-11 MED ORDER — FENTANYL CITRATE (PF) 100 MCG/2ML IJ SOLN
25.0000 ug | INTRAMUSCULAR | Status: DC | PRN
  Administered 2021-01-11 (×3): 50 ug via INTRAVENOUS

## 2021-01-11 MED ORDER — CHLORHEXIDINE GLUCONATE 0.12 % MT SOLN
15.0000 mL | Freq: Once | OROMUCOSAL | Status: AC
  Administered 2021-01-11: 15 mL via OROMUCOSAL
  Filled 2021-01-11: qty 15

## 2021-01-11 MED ORDER — PROMETHAZINE HCL 25 MG/ML IJ SOLN
INTRAMUSCULAR | Status: AC
  Filled 2021-01-11: qty 1

## 2021-01-11 MED ORDER — DROPERIDOL 2.5 MG/ML IJ SOLN: 0.6250 mg | Freq: Once | INTRAMUSCULAR | Status: DC | PRN

## 2021-01-11 MED ORDER — SODIUM CHLORIDE 0.9% FLUSH: 3.0000 mL | INTRAVENOUS | Status: DC | PRN

## 2021-01-11 MED ORDER — OXYCODONE HCL 5 MG/5ML PO SOLN: 5.0000 mg | Freq: Once | ORAL | Status: DC | PRN

## 2021-01-11 MED ORDER — ACETAMINOPHEN 650 MG RE SUPP: 650.0000 mg | RECTAL | Status: DC | PRN

## 2021-01-11 MED ORDER — SODIUM CHLORIDE 0.9 % IV SOLN: 250.0000 mL | INTRAVENOUS | Status: DC

## 2021-01-11 MED ORDER — ROCURONIUM BROMIDE 10 MG/ML (PF) SYRINGE
PREFILLED_SYRINGE | INTRAVENOUS | Status: AC
  Filled 2021-01-11: qty 30

## 2021-01-11 MED ORDER — DEXAMETHASONE SODIUM PHOSPHATE 10 MG/ML IJ SOLN
INTRAMUSCULAR | Status: DC | PRN
Start: 2021-01-11 — End: 2021-01-11
  Administered 2021-01-11: 10 mg via INTRAVENOUS

## 2021-01-11 MED ORDER — PROMETHAZINE HCL 25 MG/ML IJ SOLN
6.2500 mg | INTRAMUSCULAR | Status: AC | PRN
  Administered 2021-01-11 (×2): 6.25 mg via INTRAVENOUS

## 2021-01-11 MED ORDER — LIDOCAINE-EPINEPHRINE 1 %-1:100000 IJ SOLN
INTRAMUSCULAR | Status: AC
  Filled 2021-01-11: qty 1

## 2021-01-11 MED ORDER — BUPIVACAINE HCL (PF) 0.5 % IJ SOLN
INTRAMUSCULAR | Status: AC
  Filled 2021-01-11: qty 30

## 2021-01-11 MED ORDER — ORAL CARE MOUTH RINSE: 15.0000 mL | Freq: Once | OROMUCOSAL | Status: AC

## 2021-01-11 MED ORDER — SUCCINYLCHOLINE CHLORIDE 200 MG/10ML IV SOSY
PREFILLED_SYRINGE | INTRAVENOUS | Status: AC
  Filled 2021-01-11: qty 10

## 2021-01-11 MED ORDER — DOCUSATE SODIUM 100 MG PO CAPS
100.0000 mg | ORAL_CAPSULE | Freq: Two times a day (BID) | ORAL | Status: DC
  Administered 2021-01-11 – 2021-01-18 (×14): 100 mg via ORAL
  Filled 2021-01-11 (×14): qty 1

## 2021-01-11 MED ORDER — POLYETHYLENE GLYCOL 3350 17 G PO PACK
17.0000 g | PACK | Freq: Every day | ORAL | Status: DC | PRN
  Filled 2021-01-11: qty 1

## 2021-01-11 MED ORDER — HYDROMORPHONE HCL 1 MG/ML IJ SOLN
1.0000 mg | INTRAMUSCULAR | Status: DC | PRN
Start: 2021-01-11 — End: 2021-01-11

## 2021-01-11 MED ORDER — HYDROMORPHONE HCL 1 MG/ML IJ SOLN
0.2500 mg | INTRAMUSCULAR | Status: DC | PRN
  Administered 2021-01-11: 0.5 mg via INTRAVENOUS

## 2021-01-11 MED ORDER — ACETAMINOPHEN 10 MG/ML IV SOLN
INTRAVENOUS | Status: AC
  Filled 2021-01-11: qty 100

## 2021-01-11 MED ORDER — ARTIFICIAL TEARS OPHTHALMIC OINT
TOPICAL_OINTMENT | OPHTHALMIC | Status: AC
  Filled 2021-01-11: qty 3.5

## 2021-01-11 MED ORDER — LIDOCAINE 2% (20 MG/ML) 5 ML SYRINGE
INTRAMUSCULAR | Status: DC | PRN
  Administered 2021-01-11: 80 mg via INTRAVENOUS

## 2021-01-11 MED ORDER — FENTANYL CITRATE (PF) 100 MCG/2ML IJ SOLN
INTRAMUSCULAR | Status: AC
  Administered 2021-01-11: 50 ug via INTRAVENOUS
  Filled 2021-01-11: qty 2

## 2021-01-11 MED ORDER — DEXMEDETOMIDINE (PRECEDEX) IN NS 20 MCG/5ML (4 MCG/ML) IV SYRINGE
PREFILLED_SYRINGE | INTRAVENOUS | Status: DC | PRN
  Administered 2021-01-11 (×6): 4 ug via INTRAVENOUS

## 2021-01-11 MED ORDER — ARTIFICIAL TEARS OPHTHALMIC OINT
TOPICAL_OINTMENT | OPHTHALMIC | Status: DC | PRN
  Administered 2021-01-11: 1 via OPHTHALMIC

## 2021-01-11 MED ORDER — MENTHOL 3 MG MT LOZG
1.0000 | LOZENGE | OROMUCOSAL | Status: DC | PRN
  Filled 2021-01-11: qty 9

## 2021-01-11 MED ORDER — LOSARTAN POTASSIUM-HCTZ 100-12.5 MG PO TABS: 1.0000 | ORAL_TABLET | Freq: Every day | ORAL | Status: DC

## 2021-01-11 MED ORDER — FENTANYL CITRATE (PF) 100 MCG/2ML IJ SOLN
INTRAMUSCULAR | Status: AC
  Filled 2021-01-11: qty 2

## 2021-01-11 MED ORDER — METFORMIN HCL 500 MG PO TABS
1000.0000 mg | ORAL_TABLET | Freq: Two times a day (BID) | ORAL | Status: DC
  Administered 2021-01-12 – 2021-01-13 (×4): 1000 mg via ORAL
  Filled 2021-01-11 (×4): qty 2

## 2021-01-11 MED ORDER — FLUTICASONE PROPIONATE 50 MCG/ACT NA SUSP
2.0000 | Freq: Every day | NASAL | Status: DC
  Administered 2021-01-12 – 2021-01-18 (×6): 2 via NASAL
  Filled 2021-01-11: qty 16

## 2021-01-11 SURGICAL SUPPLY — 96 items
ADH SKN CLS APL DERMABOND .7 (GAUZE/BANDAGES/DRESSINGS) ×2
APL SKNCLS STERI-STRIP NONHPOA (GAUZE/BANDAGES/DRESSINGS)
BAG COUNTER SPONGE SURGICOUNT (BAG) ×6 IMPLANT
BAG SPNG CNTER NS LX DISP (BAG) ×4
BAND INSRT 18 STRL LF DISP RB (MISCELLANEOUS) ×4
BAND RUBBER #18 3X1/16 STRL (MISCELLANEOUS) ×6 IMPLANT
BASKET BONE COLLECTION (BASKET) ×3 IMPLANT
BENZOIN TINCTURE PRP APPL 2/3 (GAUZE/BANDAGES/DRESSINGS) IMPLANT
BIT DRILL LONG 3.0X30 (BIT) IMPLANT
BIT DRILL LONG 3X80 (BIT) IMPLANT
BIT DRILL LONG 4X80 (BIT) IMPLANT
BIT DRILL SHORT 3.0X30 (BIT) IMPLANT
BIT DRILL SHORT 3X80 (BIT) IMPLANT
BLADE CLIPPER SURG (BLADE) IMPLANT
BLADE SURG 11 STRL SS (BLADE) ×6 IMPLANT
BUR MATCHSTICK NEURO 3.0 LAGG (BURR) ×3 IMPLANT
BUR PRECISION FLUTE 5.0 (BURR) ×3 IMPLANT
BUR ROUND FLUTED 4 SOFT TCH (BURR) ×1 IMPLANT
BUR ROUND PRECISION 4.0 (BURR) ×1 IMPLANT
CAGE EXP CATALYFT 9 (Plate) ×4 IMPLANT
CAGE INTERBODY PL LG 7X26.5X24 (Cage) ×1 IMPLANT
CANISTER SUCT 3000ML PPV (MISCELLANEOUS) ×3 IMPLANT
CNTNR URN SCR LID CUP LEK RST (MISCELLANEOUS) ×2 IMPLANT
CONT SPEC 4OZ STRL OR WHT (MISCELLANEOUS) ×3
COVER BACK TABLE 60X90IN (DRAPES) ×3 IMPLANT
DECANTER SPIKE VIAL GLASS SM (MISCELLANEOUS) ×2 IMPLANT
DERMABOND ADVANCED (GAUZE/BANDAGES/DRESSINGS) ×1
DERMABOND ADVANCED .7 DNX12 (GAUZE/BANDAGES/DRESSINGS) ×2 IMPLANT
DRAPE C-ARM 42X72 X-RAY (DRAPES) ×2 IMPLANT
DRAPE C-ARMOR (DRAPES) ×1 IMPLANT
DRAPE LAPAROTOMY 100X72X124 (DRAPES) ×3 IMPLANT
DRAPE MICROSCOPE LEICA (MISCELLANEOUS) ×3 IMPLANT
DRAPE SHEET LG 3/4 BI-LAMINATE (DRAPES) ×3 IMPLANT
DRAPE SURG 17X23 STRL (DRAPES) ×3 IMPLANT
DURAPREP 26ML APPLICATOR (WOUND CARE) ×3 IMPLANT
ELECT BLADE 4.0 EZ CLEAN MEGAD (MISCELLANEOUS) ×3
ELECT REM PT RETURN 9FT ADLT (ELECTROSURGICAL) ×3
ELECTRODE BLDE 4.0 EZ CLN MEGD (MISCELLANEOUS) IMPLANT
ELECTRODE REM PT RTRN 9FT ADLT (ELECTROSURGICAL) ×2 IMPLANT
EVACUATOR 1/8 PVC DRAIN (DRAIN) ×1 IMPLANT
GAUZE 4X4 16PLY ~~LOC~~+RFID DBL (SPONGE) IMPLANT
GAUZE SPONGE 4X4 12PLY STRL (GAUZE/BANDAGES/DRESSINGS) IMPLANT
GLOVE ECLIPSE 9.0 STRL (GLOVE) ×1 IMPLANT
GLOVE EXAM NITRILE LRG STRL (GLOVE) IMPLANT
GLOVE EXAM NITRILE XL STR (GLOVE) IMPLANT
GLOVE EXAM NITRILE XS STR PU (GLOVE) IMPLANT
GLOVE SURG LTX SZ7.5 (GLOVE) ×8 IMPLANT
GLOVE SURG POLYISO LF SZ7 (GLOVE) ×5 IMPLANT
GLOVE SURG UNDER POLY LF SZ6.5 (GLOVE) ×1 IMPLANT
GLOVE SURG UNDER POLY LF SZ7 (GLOVE) ×1 IMPLANT
GLOVE SURG UNDER POLY LF SZ7.5 (GLOVE) ×13 IMPLANT
GOWN STRL REUS W/ TWL LRG LVL3 (GOWN DISPOSABLE) ×8 IMPLANT
GOWN STRL REUS W/ TWL XL LVL3 (GOWN DISPOSABLE) IMPLANT
GOWN STRL REUS W/TWL 2XL LVL3 (GOWN DISPOSABLE) IMPLANT
GOWN STRL REUS W/TWL LRG LVL3 (GOWN DISPOSABLE) ×18
GOWN STRL REUS W/TWL XL LVL3 (GOWN DISPOSABLE) ×9
GUIDEWIRE BLUNT NT 450 (WIRE) ×13 IMPLANT
HEMOSTAT POWDER KIT SURGIFOAM (HEMOSTASIS) ×3 IMPLANT
KIT BASIN OR (CUSTOM PROCEDURE TRAY) ×3 IMPLANT
KIT POSITION SURG JACKSON T1 (MISCELLANEOUS) ×3 IMPLANT
KIT SPINE MAZOR X ROBO DISP (MISCELLANEOUS) ×4 IMPLANT
KIT TURNOVER KIT B (KITS) ×3 IMPLANT
MILL MEDIUM DISP (BLADE) ×1 IMPLANT
NDL HYPO 18GX1.5 BLUNT FILL (NEEDLE) IMPLANT
NDL SPNL 18GX3.5 QUINCKE PK (NEEDLE) IMPLANT
NEEDLE HYPO 18GX1.5 BLUNT FILL (NEEDLE) IMPLANT
NEEDLE HYPO 22GX1.5 SAFETY (NEEDLE) ×3 IMPLANT
NEEDLE SPNL 18GX3.5 QUINCKE PK (NEEDLE) IMPLANT
NS IRRIG 1000ML POUR BTL (IV SOLUTION) ×3 IMPLANT
PACK LAMINECTOMY NEURO (CUSTOM PROCEDURE TRAY) ×3 IMPLANT
PAD ARMBOARD 7.5X6 YLW CONV (MISCELLANEOUS) ×9 IMPLANT
PIN HEAD 2.5X60MM (PIN) IMPLANT
ROD 5.5MM SPINAL SOLERA (Rod) ×1 IMPLANT
SCREW FNS SOLERA 7.5X45 (Screw) ×2 IMPLANT
SCREW SCHANZ SA 4.0MM (MISCELLANEOUS) IMPLANT
SCREW SET SOLERA (Screw) ×36 IMPLANT
SCREW SET SOLERA TI5.5 (Screw) IMPLANT
SCREW SOLERA 45X5.5XMA (Screw) IMPLANT
SCREW SOLERA 45X6.5XMA (Screw) IMPLANT
SCREW SOLERA 5.5X45MM (Screw) ×12 IMPLANT
SCREW SOLERA 50X6.5XMA (Screw) IMPLANT
SCREW SOLERA 6.5X45MM (Screw) ×6 IMPLANT
SCREW SOLERA 6.5X50MM (Screw) ×12 IMPLANT
SPONGE SURGIFOAM ABS GEL 100 (HEMOSTASIS) IMPLANT
SPONGE T-LAP 4X18 ~~LOC~~+RFID (SPONGE) IMPLANT
STRIP CLOSURE SKIN 1/2X4 (GAUZE/BANDAGES/DRESSINGS) IMPLANT
SUT MNCRL AB 3-0 PS2 18 (SUTURE) ×3 IMPLANT
SUT VIC AB 0 CT1 18XCR BRD8 (SUTURE) ×2 IMPLANT
SUT VIC AB 0 CT1 8-18 (SUTURE) ×9
SUT VIC AB 2-0 CP2 18 (SUTURE) ×5 IMPLANT
SYR 3ML LL SCALE MARK (SYRINGE) IMPLANT
TOWEL GREEN STERILE (TOWEL DISPOSABLE) ×3 IMPLANT
TOWEL GREEN STERILE FF (TOWEL DISPOSABLE) ×3 IMPLANT
TRAY FOLEY MTR SLVR 16FR STAT (SET/KITS/TRAYS/PACK) ×3 IMPLANT
TUBE MAZOR SA REDUCTION (TUBING) ×3 IMPLANT
WATER STERILE IRR 1000ML POUR (IV SOLUTION) ×3 IMPLANT

## 2021-01-11 NOTE — Op Note (Signed)
PATIENT: [name]  DAY OF SURGERY: 01/11/21   PRE-OPERATIVE DIAGNOSIS:  Lumbar stenosis, lumbar radiculopathy   POST-OPERATIVE DIAGNOSIS:  Same   PROCEDURE:  L1-2, L2-3, L3-4, L4-5, L5-S1 laminectomies, transforaminal lumbar interbody fusion, posterolateral instrumented fusion, use of robotic stereotactic guidance (Mazor) for pedicle screw placement   SURGEON:  Surgeon(s) and Role:    Judith Part, MD - Primary    Earnie Larsson, MD - Assisting   ANESTHESIA: ETGA   BRIEF HISTORY: This is a 55 year old man who presented with severe low back and right greater than left lower extremity pain with some mild weakness in the right L5 myotome. The patient was found to have severe multilevel canal and foraminal stenosis with severe spondylosis. I therefore recommended decompression and instrumented fusion at those levels. This was discussed with the patient as well as risks, benefits, and alternatives and wished to proceed with surgery.   OPERATIVE DETAIL: The patient was taken to the operating room and anesthesia was induced by the anesthesia team. They were placed on the OR table in the prone position with padding of all pressure points. A formal time out was performed with two patient identifiers and confirmed the operative site. The operative site was marked, hair was clipped with surgical clippers, the area was then prepped and draped in a sterile fashion. Fluoro was used to localize the operative level and a midline incision was placed to expose from L1 to S1 out to the transverse processes bilaterally. Subperiosteal dissection was performed bilaterally and fluoroscopy was again used to confirm the surgical level.   The Mazor robot was attached to the table and draped in a sterile fashion. The pedicle screw trajectories had previously been planned and re-reviewed this morning. Fluoroscopy was used to register the patient to their preop CT scan with good fit. The robot was then used to  stereotactically guide a cannula with a reducing tube for the CD4 drill, which was used to drill the pedicle trajectories at L1, L2, L3, L4, L5, and S1. At each level, a K-wire was placed. The robotic arm was then moved out of the field and a cannulated tap was used with fluoroscopy to tap the screw trajectory, after which it was sealed with bone wax to prevent bleeding. Attention was then turned to decompression.  Decompression was then performed, which consisted of laminectomies at L1-2, L2-3, L3-4, L4-5, and L5-S1. These were performed with a combination of high speed drill and rongeurs. During all phases of decompression, the patient's bone was saved as allograft for use during the fusion.  TLIFs were then performed. Complete facetectomies were performed on the right at L1-2, left at L2-3, left at L3-4, left at L4-5, and right at L5-S1. Contralateral foraminotomies were performed until no palpable stenosis was present. At each level, the traversing and exiting roots were identified and protected, the annulus was incised, then a discectomy was performed with a combination of disc space instruments followed by endplate prep. The disc space was packed with bone graft followed by fluoroscopic guided placement of an expandable interbody graft. The graft was expanded, location confirmed, and bone was backfilled through the graft. This was repeated at all 5 levels in the same fashion.   The bone wax was removed from the previously tapped holes and pedicle screws were placed bilaterally at L1, L2, L3, L4, L5, and S1. These were connected with rods bilaterally and final tightened according to manufacturer torque specifications, with some compression to help with creating  some lordosis given his loss of lordosis and SVA. The bone was thoroughly decorticated over the fusion surfaces bilaterally over the transverse processes and the previously resected bone fragments were morselized and used as autograft.   All  instrument and sponge counts were correct, the incision was then closed in layers. The patient was then returned to anesthesia for emergence. No apparent complications at the completion of the procedure.   EBL:  2055mL but with 800 given back with cell saver   DRAINS: none   SPECIMENS: none   Judith Part, MD 01/11/21 7:28 AM

## 2021-01-11 NOTE — Anesthesia Procedure Notes (Signed)
Arterial Line Insertion Start/End9/30/2022 7:45 AM, 01/11/2021 7:55 AM Performed by: Bryson Corona, CRNA, CRNA  Patient location: Pre-op. Preanesthetic checklist: patient identified, IV checked, site marked, risks and benefits discussed, surgical consent, monitors and equipment checked, pre-op evaluation, timeout performed and anesthesia consent Lidocaine 1% used for infiltration Left, radial was placed Catheter size: 20 G Hand hygiene performed  and maximum sterile barriers used   Attempts: 1 Procedure performed without using ultrasound guided technique. Following insertion, dressing applied and Biopatch. Post procedure assessment: normal and unchanged  Patient tolerated the procedure well with no immediate complications.

## 2021-01-11 NOTE — Progress Notes (Signed)
Left radial arterial line removed per order.  Pressure held for 10 minutes and pressure dressing applied.  Site unremarkable.  Will continue to monitor.

## 2021-01-11 NOTE — Transfer of Care (Signed)
Immediate Anesthesia Transfer of Care Note  Patient: [name]  Procedure(s) Performed: Lumbar one-two, Lumbar two-three, Lumbar three-four, Lumbar four-five, Lumbar five Sacral one Open decompression, Transforaminal lumbar interbody fusion, posterolateral instrumented fusion (Spine Lumbar) APPLICATION OF ROBOTIC ASSISTANCE FOR SPINAL PROCEDURE APPLICATION OF CELL SAVER  Patient Location: PACU  Anesthesia Type:General  Level of Consciousness: awake, alert  and oriented  Airway & Oxygen Therapy: Patient Spontanous Breathing and Patient connected to face mask oxygen  Post-op Assessment: Report given to RN and Post -op Vital signs reviewed and stable  Post vital signs: Reviewed and stable  Last Vitals:  Vitals Value Taken Time  BP 149/94 01/11/21 1744  Temp    Pulse 123 01/11/21 1748  Resp 16 01/11/21 1748  SpO2 99 % 01/11/21 1748  Vitals shown include unvalidated device data.  Last Pain:  Vitals:   01/11/21 0609  TempSrc:   PainSc: 9       Patients Stated Pain Goal: 2 (76/19/50 9326)  Complications: No notable events documented.

## 2021-01-11 NOTE — Progress Notes (Signed)
Neurosurgery Service Post-operative progress note  Assessment & Plan: 55 y.o. man s/p L1-S1 decompression and instrumented fusion, seen in PACU, MAEx4, recovering well.  -admit to floor bed -regular diet -activity as tolerated, no brace needed -SQH POD2 -CBC/RFP in AM  Marcello Moores A Athalene Kolle  01/11/21 6:54 PM

## 2021-01-11 NOTE — Anesthesia Procedure Notes (Addendum)
Procedure Name: Intubation Date/Time: 01/11/2021 8:30 AM Performed by: Bryson Corona, CRNA Pre-anesthesia Checklist: Patient identified, Emergency Drugs available, Suction available and Patient being monitored Patient Re-evaluated:Patient Re-evaluated prior to induction Oxygen Delivery Method: Circle System Utilized Preoxygenation: Pre-oxygenation with 100% oxygen Induction Type: IV induction Ventilation: Mask ventilation without difficulty Laryngoscope Size: Mac and 4 Grade View: Grade II Tube type: Oral Tube size: 7.0 mm Number of attempts: 1 Airway Equipment and Method: Stylet and Oral airway Placement Confirmation: ETT inserted through vocal cords under direct vision, positive ETCO2 and breath sounds checked- equal and bilateral Secured at: 22 cm Tube secured with: Tape Dental Injury: Teeth and Oropharynx as per pre-operative assessment  Comments: Majority of glottic opening visible. Easy mask.

## 2021-01-11 NOTE — H&P (Signed)
Surgical H&P Update  HPI: 55 y.o. man with a history of low back and lower extremity radicular pain. No changes in health since he was last seen. Still having severe low back and R>L LE pain and wishes to proceed with surgery.  PMHx:  Past Medical History:  Diagnosis Date   DDD (degenerative disc disease), lumbar    Diabetes mellitus    Hyperlipemia    Hypertension    Obesity    OSA (obstructive sleep apnea)    on CPAP   FamHx:  Family History  Problem Relation Age of Onset   Diabetes Mother    Hypertension Neg Hx    Coronary artery disease Neg Hx    Colon cancer Neg Hx    Prostate cancer Neg Hx    SocHx:  reports that he has never smoked. He has never used smokeless tobacco. He reports that he does not drink alcohol and does not use drugs.  Physical Exam: Strength 5/5 x4 except R TA 4+/5, SILTx4 except for R L5 numbness, reflexes 3+ w/ +hoffman's b/l  Assesment/Plan: 55 y.o. 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 Risks, benefits, and alternatives discussed and the patient would like to continue with surgery.  -OR today -4NP post-op  Judith Part, MD 01/11/21 7:25 AM

## 2021-01-12 LAB — RENAL FUNCTION PANEL
Albumin: 3.3 g/dL — ABNORMAL LOW (ref 3.5–5.0)
Anion gap: 10 (ref 5–15)
BUN: 20 mg/dL (ref 6–20)
CO2: 21 mmol/L — ABNORMAL LOW (ref 22–32)
Calcium: 7.5 mg/dL — ABNORMAL LOW (ref 8.9–10.3)
Chloride: 100 mmol/L (ref 98–111)
Creatinine, Ser: 1.21 mg/dL (ref 0.61–1.24)
GFR, Estimated: >60 mL/min (ref >60)
Glucose, Bld: 284 mg/dL — ABNORMAL HIGH (ref 70–99)
Phosphorus: 3.1 mg/dL (ref 2.5–4.6)
Potassium: 4.4 mmol/L (ref 3.5–5.1)
Sodium: 131 mmol/L — ABNORMAL LOW (ref 135–145)

## 2021-01-12 LAB — GLUCOSE, CAPILLARY
Glucose-Capillary: 257 mg/dL — ABNORMAL HIGH (ref 70–99)
Glucose-Capillary: 262 mg/dL — ABNORMAL HIGH (ref 70–99)
Glucose-Capillary: 256 mg/dL — ABNORMAL HIGH (ref 70–99)

## 2021-01-12 LAB — CBC
HCT: 36.5 % — ABNORMAL LOW (ref 39.0–52.0)
Hemoglobin: 12.7 g/dL — ABNORMAL LOW (ref 13.0–17.0)
MCH: 31.2 pg (ref 26.0–34.0)
MCHC: 34.8 g/dL (ref 30.0–36.0)
MCV: 89.7 fL (ref 80.0–100.0)
Platelets: 238 10*3/uL (ref 150–400)
RBC: 4.07 MIL/uL — ABNORMAL LOW (ref 4.22–5.81)
RDW: 12.7 % (ref 11.5–15.5)
WBC: 17.7 10*3/uL — ABNORMAL HIGH (ref 4.0–10.5)
nRBC: 0 % (ref 0.0–0.2)

## 2021-01-12 MED ORDER — INSULIN ASPART 100 UNIT/ML IJ SOLN
0.0000 [IU] | Freq: Three times a day (TID) | INTRAMUSCULAR | Status: DC
Start: 2021-01-12 — End: 2021-01-13
  Administered 2021-01-12: 8 [IU] via SUBCUTANEOUS
  Administered 2021-01-13: 5 [IU] via SUBCUTANEOUS
  Administered 2021-01-13: 3 [IU] via SUBCUTANEOUS
  Administered 2021-01-13: 5 [IU] via SUBCUTANEOUS

## 2021-01-12 MED ORDER — SODIUM CHLORIDE 0.9 % IV BOLUS
1000.0000 mL | Freq: Once | INTRAVENOUS | Status: AC
  Administered 2021-01-12: 1000 mL via INTRAVENOUS

## 2021-01-12 MED ORDER — SODIUM CHLORIDE 0.9 % IV SOLN: INTRAVENOUS | Status: DC

## 2021-01-12 MED ORDER — LOSARTAN POTASSIUM 50 MG PO TABS
100.0000 mg | ORAL_TABLET | Freq: Every day | ORAL | Status: DC
  Administered 2021-01-12 – 2021-01-13 (×2): 100 mg via ORAL
  Filled 2021-01-12 (×2): qty 2

## 2021-01-12 MED ORDER — TAMSULOSIN HCL 0.4 MG PO CAPS
0.4000 mg | ORAL_CAPSULE | Freq: Every day | ORAL | Status: DC
  Administered 2021-01-12 – 2021-01-18 (×7): 0.4 mg via ORAL
  Filled 2021-01-12 (×7): qty 1

## 2021-01-12 MED ORDER — HYDROCHLOROTHIAZIDE 12.5 MG PO CAPS
12.5000 mg | ORAL_CAPSULE | Freq: Every day | ORAL | Status: DC
  Administered 2021-01-12 – 2021-01-13 (×2): 12.5 mg via ORAL
  Filled 2021-01-12 (×2): qty 1

## 2021-01-12 NOTE — Progress Notes (Addendum)
Called [facility] to discuss patient's trouble voiding and bladder scan of 353, new orders for Tamulosin received from NP. Patient encouraged to try voiding standing with PT/OT.   PT/OT sat patient back in chair after attempting to stand and patient's BP dropped to 88/55, but remained A+Ox4 and recheck after sitting for a few minutes was 107/63. NP notified of BP drop as well as pt change in HR with increased pain.   Patient unable to void standing with PT/OT and I+O cath was performed with 778ml output.  Order for 1059ml Bolus and orders for regular ACHS blood glucose checks and Insulin received. Pt remained A+Ox4, and stated he "felt much better" after I+O cath performed.

## 2021-01-12 NOTE — Progress Notes (Signed)
Postop day 1.  Overall patient doing well.  His back pain is well controlled.  He is not having any lower extremity symptoms.  He is not mobilized from bed.  He does note tenderness of his chest.  The pain is superficial.  It appears to be related to direct compression from his positioning yesterday.  He is not having any substernal pressure sensations he is not have any shortness of breath.  This pain once again appears superficial and noncardiac or pulmonary related.  He is afebrile.  His vitals are stable.  His urine output is good.  His follow-up hematocrit is surprisingly good.  His abdomen is soft.  Motor and sensory function are intact.  Progressing well following extensive lumbar decompression and fusion surgery.  Continue efforts at mobilization and work towards discharge early in the week.

## 2021-01-12 NOTE — Plan of Care (Signed)
  Problem: Education: Goal: Ability to verbalize activity precautions or restrictions will improve Outcome: Progressing Goal: Knowledge of the prescribed therapeutic regimen will improve Outcome: Progressing Goal: Understanding of discharge needs will improve Outcome: Progressing   Problem: Activity: Goal: Ability to avoid complications of mobility impairment will improve Outcome: Progressing Goal: Ability to tolerate increased activity will improve Outcome: Progressing Goal: Will remain free from falls Outcome: Progressing   

## 2021-01-12 NOTE — Plan of Care (Signed)

## 2021-01-12 NOTE — Evaluation (Signed)
Physical Therapy Evaluation Patient Details Name: [name] MRN: [id number] DOB: 12/24/1965 Today's Date: 01/12/2021  History of Present Illness  Pt is a 55 y/o male who presents s/p L1-S1 TLIF on 01/11/2021. PMH significant for DDD, DM, HTN.  Clinical Impression  Pt admitted with above diagnosis. At the time of PT eval, pt was able to demonstrate transfers and ambulation with gross +2 assist and RW for support. Pt limited this session by urinary retention, and pt reports feeling painful and full in his bladder. Also limited by orthostatic hypotension with BP dropping to 88/55 after transfer to the recliner. RN notified. Pt was educated on precautions, positioning recommendations, and appropriate activity progression. Pt currently with functional limitations due to the deficits listed below (see PT Problem List). Pt will benefit from skilled PT to increase their independence and safety with mobility to allow discharge to the venue listed below.         Recommendations for follow up therapy are one component of a multi-disciplinary discharge planning process, led by the attending physician.  Recommendations may be updated based on patient status, additional functional criteria and insurance authorization.  Follow Up Recommendations CIR;Supervision for mobility/OOB    Equipment Recommendations  Rolling walker with 5" wheels    Recommendations for Other Services       Precautions / Restrictions Precautions Precautions: Fall;Back Precaution Booklet Issued: No Precaution Comments: Reviewed precautions during functional mobility. Needs handout. Required Braces or Orthoses: Other Brace (No brace needed order) Restrictions Weight Bearing Restrictions: No      Mobility  Bed Mobility Overal bed mobility: Needs Assistance Bed Mobility: Rolling;Sidelying to Sit;Sit to Sidelying Rolling: Min assist Sidelying to sit: Mod assist;+2 for safety/equipment     Sit to sidelying: Mod assist;+2  for safety/equipment General bed mobility comments: +2 assist for optimal log roll technique. HOB elevated for comfort.    Transfers Overall transfer level: Needs assistance Equipment used: Rolling walker (2 wheeled) Transfers: Sit to/from [employer] Sit to Stand: Mod assist;+2 physical assistance;From elevated surface Stand pivot transfers: Min assist;+2 physical assistance       General transfer comment: VC's for hand placement on seated surface for safety, wide BOS, and improved posture throughout sit<>stand. Pt stood from EOB and from recliner chair. He was able to take pivotal steps to transition between bed and chair.  Ambulation/Gait             General Gait Details: Unable to progress to ambulation this session.  Stairs            Wheelchair Mobility    Modified Rankin (Stroke Patients Only)       Balance Overall balance assessment: Needs assistance Sitting-balance support: Feet supported;No upper extremity supported Sitting balance-Leahy Scale: Poor Sitting balance - Comments: Requires at least 1 UE support on the bed for support. Postural control: Posterior lean Standing balance support: Bilateral upper extremity supported;During functional activity Standing balance-Leahy Scale: Poor Standing balance comment: Reliant on UE support on the RW                             Pertinent Vitals/Pain Pain Assessment: 0-10 Pain Score: 7  Pain Location: At rest - Incision site Pain Descriptors / Indicators: Operative site guarding;Aching Pain Intervention(s): Limited activity within patient's tolerance;Monitored during session;Repositioned    Home Living Family/patient expects to be discharged to:: Private residence Living Arrangements: Spouse/significant other;Children Available Help at Discharge: Family;Available 24 hours/day Type of  Home: House Home Access: Stairs to enter Entrance Stairs-Rails: None Entrance Stairs-Number of  Steps: 3 Home Layout: One level Home Equipment: None      Prior Function Level of Independence: Independent         Comments: Have not worked since May 28th due to pain. Independent with ADL's. Driving. Community ambulator with a shopping cart.     Hand Dominance   Dominant Hand: Left    Extremity/Trunk Assessment   Upper Extremity Assessment Upper Extremity Assessment: Defer to OT evaluation    Lower Extremity Assessment Lower Extremity Assessment: Generalized weakness    Cervical / Trunk Assessment Cervical / Trunk Assessment: Other exceptions Cervical / Trunk Exceptions: s/p surgery  Communication   Communication: No difficulties  Cognition Arousal/Alertness: Awake/alert Behavior During Therapy: WFL for tasks assessed/performed Overall Cognitive Status: Within Functional Limits for tasks assessed                                        General Comments General comments (skin integrity, edema, etc.): BP once seated in chair - 88/55 HR 155, 5 mins later BP 89/57, Returned to supine 107/5    Exercises     Assessment/Plan    PT Assessment Patient needs continued PT services  PT Problem List Decreased strength;Decreased activity tolerance;Decreased balance;Decreased mobility;Decreased knowledge of use of DME;Decreased knowledge of precautions;Decreased safety awareness;Pain       PT Treatment Interventions DME instruction;Gait training;Stair training;Functional mobility training;Therapeutic activities;Therapeutic exercise;Neuromuscular re-education;Patient/family education    PT Goals (Current goals can be found in the Care Plan section)  Acute Rehab PT Goals Patient Stated Goal: Decreased pain, be able to urinate PT Goal Formulation: With patient Time For Goal Achievement: 01/26/21 Potential to Achieve Goals: Good    Frequency Min 5X/week   Barriers to discharge        Co-evaluation PT/OT/SLP Co-Evaluation/Treatment: Yes Reason for  Co-Treatment: For patient/therapist safety;To address functional/ADL transfers PT goals addressed during session: Mobility/safety with mobility;Balance;Proper use of DME         AM-PAC PT "6 Clicks" Mobility  Outcome Measure Help needed turning from your back to your side while in a flat bed without using bedrails?: A Lot Help needed moving from lying on your back to sitting on the side of a flat bed without using bedrails?: Total Help needed moving to and from a bed to a chair (including a wheelchair)?: Total Help needed standing up from a chair using your arms (e.g., wheelchair or bedside chair)?: A Lot Help needed to walk in hospital room?: Total Help needed climbing 3-5 steps with a railing? : Total 6 Click Score: 8    End of Session Equipment Utilized During Treatment: Gait belt Activity Tolerance: Patient limited by pain;Other (comment) (orthostatic hypotension) Patient left: in bed;with call bell/phone within reach;with bed alarm set Nurse Communication: Mobility status PT Visit Diagnosis: Unsteadiness on feet (R26.81);Pain Pain - part of body:  (back)    Time: 1340-1430 PT Time Calculation (min) (ACUTE ONLY): 50 min   Charges:   PT Evaluation $PT Eval Moderate Complexity: 1 Mod PT Treatments $Gait Training: 8-22 mins        Rolinda Roan, PT, DPT Acute Rehabilitation Services Pager: [phone] Office: [phone]   Thelma Comp 01/12/2021, 3:42 PM

## 2021-01-12 NOTE — Evaluation (Signed)
Occupational Therapy Evaluation Patient Details Name: [name] MRN: [id number] DOB: [birth year] Today's Date: 01/12/2021   History of Present Illness Pt is a 55 y/o male who presents s/p L1-S1 TLIF on 01/11/2021. PMH significant for DDD, DM, HTN.   Clinical Impression   Pt admitted for procedure listed above. PTA pt reported he was independent with all ADL's and IADL's, using no DME. At this time, pt requires min-max A +2 for all ADL's and functional mobility, due to increased weakness and pain.  This session, pt was also limited by orthostatic hypotension with BP dropping to 88/55 after transfer to the recliner. RN notified. Pt was educated on precautions, positioning recommendations, and appropriate activity progression. CIR recommended at this time to maximize pts progress back to independence. OT will follow acutely.      Recommendations for follow up therapy are one component of a multi-disciplinary discharge planning process, led by the attending physician.  Recommendations may be updated based on patient status, additional functional criteria and insurance authorization.   Follow Up Recommendations  CIR    Equipment Recommendations  Other (comment) (RW - Bariatric)    Recommendations for Other Services Rehab consult     Precautions / Restrictions Precautions Precautions: Fall;Back Precaution Booklet Issued: No Precaution Comments: Reviewed precautions during functional mobility. Needs handout. Required Braces or Orthoses: Other Brace (No brace needed order) Restrictions Weight Bearing Restrictions: No      Mobility Bed Mobility Overal bed mobility: Needs Assistance Bed Mobility: Rolling;Sidelying to Sit;Sit to Sidelying Rolling: Min assist Sidelying to sit: Mod assist;+2 for safety/equipment     Sit to sidelying: Mod assist;+2 for safety/equipment General bed mobility comments: +2 assist for optimal log roll technique. HOB elevated for comfort.     Transfers Overall transfer level: Needs assistance Equipment used: Rolling walker (2 wheeled) Transfers: Sit to/from [facility] Sit to Stand: Mod assist;+2 physical assistance;From elevated surface Stand pivot transfers: Min assist;+2 physical assistance       General transfer comment: VC's for hand placement on seated surface for safety, wide BOS, and improved posture throughout sit<>stand. Pt stood from EOB and from recliner chair. He was able to take pivotal steps to transition between bed and chair.    Balance Overall balance assessment: Needs assistance Sitting-balance support: Feet supported;No upper extremity supported Sitting balance-Leahy Scale: Poor Sitting balance - Comments: Requires at least 1 UE support on the bed for support. Postural control: Posterior lean Standing balance support: Bilateral upper extremity supported;During functional activity Standing balance-Leahy Scale: Poor Standing balance comment: Reliant on UE support on the RW                           ADL either performed or assessed with clinical judgement   ADL Overall ADL's : Needs assistance/impaired Eating/Feeding: Independent;Sitting   Grooming: Set up;Sitting   Upper Body Bathing: Min guard;Minimal assistance;Sitting   Lower Body Bathing: Maximal assistance;+2 for physical assistance;+2 for safety/equipment;Sitting/lateral leans;Sit to/from stand   Upper Body Dressing : Min guard;Sitting   Lower Body Dressing: Maximal assistance;+2 for physical assistance;+2 for safety/equipment;Sitting/lateral leans;Sit to/from stand;Adhering to back precautions   Toilet Transfer: Moderate assistance;+2 for physical assistance;+2 for safety/equipment;Stand-pivot   Toileting- Clothing Manipulation and Hygiene: Maximal assistance;+2 for physical assistance;+2 for safety/equipment;Sitting/lateral lean;Sit to/from stand       Functional mobility during ADLs: Moderate  assistance;+2 for physical assistance;+2 for safety/equipment;Rolling walker General ADL Comments: Pt limited by pain and hypotension this session. He is  requiring min-max A +2 for all ADL's at this time, and verbal cueing to ensure he follows his back precautions.     Vision Baseline Vision/History: 0 No visual deficits Ability to See in Adequate Light: 0 Adequate Patient Visual Report: No change from baseline Vision Assessment?: No apparent visual deficits     Perception Perception Perception Tested?: No   Praxis Praxis Praxis tested?: Not tested    Pertinent Vitals/Pain Pain Assessment: 0-10 Pain Score: 7  Pain Location: At rest - Incision site Pain Descriptors / Indicators: Operative site guarding;Aching Pain Intervention(s): Limited activity within patient's tolerance;Monitored during session;Repositioned     Hand Dominance Left   Extremity/Trunk Assessment Upper Extremity Assessment Upper Extremity Assessment: Defer to OT evaluation   Lower Extremity Assessment Lower Extremity Assessment: Generalized weakness   Cervical / Trunk Assessment Cervical / Trunk Assessment: Other exceptions Cervical / Trunk Exceptions: s/p surgery   Communication Communication Communication: No difficulties   Cognition Arousal/Alertness: Awake/alert Behavior During Therapy: WFL for tasks assessed/performed Overall Cognitive Status: Within Functional Limits for tasks assessed                                     General Comments  BP once seated in chair - 88/55 HR 155, 5 mins later BP 89/57, Returned to supine 107/53 HR 144.    Exercises     Shoulder Instructions      Home Living Family/patient expects to be discharged to:: Private residence Living Arrangements: Spouse/significant other;Children Available Help at Discharge: Family;Available 24 hours/day Type of Home: House Home Access: Stairs to enter [employer] of Steps: 3 Entrance Stairs-Rails:  None Home Layout: One level     Bathroom Shower/Tub: Teacher, early years/pre: Handicapped height     Home Equipment: None          Prior Functioning/Environment Level of Independence: Independent        Comments: Have not worked since May 28th due to pain. Independent with ADL's. Driving. Community ambulator with a shopping cart.        OT Problem List: Decreased strength;Decreased range of motion;Decreased activity tolerance;Impaired balance (sitting and/or standing);Decreased safety awareness;Decreased knowledge of use of DME or AE;Pain      OT Treatment/Interventions: Self-care/ADL training;Therapeutic exercise;Energy conservation;DME and/or AE instruction;Therapeutic activities;Patient/family education;Balance training    OT Goals(Current goals can be found in the care plan section) Acute Rehab OT Goals Patient Stated Goal: Decreased pain, be able to urinate OT Goal Formulation: With patient Time For Goal Achievement: 01/26/21 Potential to Achieve Goals: Good ADL Goals Pt Will Perform Grooming: with modified independence;standing Pt Will Perform Lower Body Bathing: with modified independence;sitting/lateral leans;sit to/from stand;with adaptive equipment Pt Will Perform Lower Body Dressing: with modified independence;with adaptive equipment;sitting/lateral leans;sit to/from stand Pt Will Transfer to Toilet: with modified independence;ambulating Pt Will Perform Toileting - Clothing Manipulation and hygiene: with modified independence;sitting/lateral leans;sit to/from stand;with adaptive equipment Additional ADL Goal #1: Pt will recall 3/3 back precautions 100% of the time.  OT Frequency: Min 2X/week   Barriers to D/C:            Co-evaluation   Reason for Co-Treatment: For patient/therapist safety;To address functional/ADL transfers PT goals addressed during session: Mobility/safety with mobility;Balance;Proper use of DME        AM-PAC OT "6  Clicks" Daily Activity     Outcome Measure Help from another person eating meals?: None Help from another  person taking care of personal grooming?: A Little Help from another person toileting, which includes using toliet, bedpan, or urinal?: A Lot Help from another person bathing (including washing, rinsing, drying)?: A Lot Help from another person to put on and taking off regular upper body clothing?: A Little Help from another person to put on and taking off regular lower body clothing?: A Lot 6 Click Score: 16   End of Session Equipment Utilized During Treatment: Gait belt;Rolling walker Nurse Communication: Mobility status  Activity Tolerance: Patient limited by pain Patient left: in bed;with bed alarm set;with call bell/phone within reach;with family/visitor present  OT Visit Diagnosis: Unsteadiness on feet (R26.81);Other abnormalities of gait and mobility (R26.89);Muscle weakness (generalized) (M62.81)                Time: 9507-2257 OT Time Calculation (min): 49 min Charges:  OT General Charges $OT Visit: 1 Visit OT Evaluation $OT Eval Moderate Complexity: 1 Mod OT Treatments $Therapeutic Activity: 8-22 mins  Caryn Gienger H., OTR/L Acute Rehabilitation  Yuritzi Kamp Elane Erbie Arment 01/12/2021, 3:49 PM

## 2021-01-12 NOTE — Anesthesia Postprocedure Evaluation (Signed)
Anesthesia Post Note  Patient: [name]  Procedure(s) Performed: Lumbar one-two, Lumbar two-three, Lumbar three-four, Lumbar four-five, Lumbar five Sacral one Open decompression, Transforaminal lumbar interbody fusion, posterolateral instrumented fusion (Spine Lumbar) APPLICATION OF ROBOTIC ASSISTANCE FOR SPINAL PROCEDURE APPLICATION OF CELL SAVER     Patient location during evaluation: PACU Anesthesia Type: General Level of consciousness: awake and alert Pain management: pain level controlled Vital Signs Assessment: post-procedure vital signs reviewed and stable Respiratory status: spontaneous breathing, nonlabored ventilation, respiratory function stable and patient connected to nasal cannula oxygen Cardiovascular status: blood pressure returned to baseline and stable Postop Assessment: no apparent nausea or vomiting Anesthetic complications: no   No notable events documented.  Last Vitals:  Vitals:   01/12/21 1542 01/12/21 1551  BP: (!) 86/49 (!) 98/51  Pulse: (!) 135   Resp: (!) 21   Temp: 36.8 C   SpO2: 98%     Last Pain:  Vitals:   01/12/21 1542  TempSrc: Oral  PainSc:                  Merlinda Frederick

## 2021-01-13 DIAGNOSIS — D62 Acute posthemorrhagic anemia: Secondary | ICD-10-CM | POA: Diagnosis not present

## 2021-01-13 DIAGNOSIS — M5416 Radiculopathy, lumbar region: Secondary | ICD-10-CM | POA: Diagnosis not present

## 2021-01-13 LAB — BASIC METABOLIC PANEL
Anion gap: 11 (ref 5–15)
BUN: 30 mg/dL — ABNORMAL HIGH (ref 6–20)
CO2: 18 mmol/L — ABNORMAL LOW (ref 22–32)
Calcium: 7.2 mg/dL — ABNORMAL LOW (ref 8.9–10.3)
Chloride: 101 mmol/L (ref 98–111)
Creatinine, Ser: 2.09 mg/dL — ABNORMAL HIGH (ref 0.61–1.24)
GFR, Estimated: 36 mL/min — ABNORMAL LOW (ref >60)
Glucose, Bld: 200 mg/dL — ABNORMAL HIGH (ref 70–99)
Potassium: 4.2 mmol/L (ref 3.5–5.1)
Sodium: 130 mmol/L — ABNORMAL LOW (ref 135–145)

## 2021-01-13 LAB — LACTIC ACID, PLASMA: Lactic Acid, Venous: 2.3 mmol/L (ref 0.5–1.9)

## 2021-01-13 LAB — CK TOTAL AND CKMB (NOT AT ARMC)
CK, MB: 2.6 ng/mL (ref 0.5–5.0)
Relative Index: 0.1 (ref 0.0–2.5)
Total CK: 2972 U/L — ABNORMAL HIGH (ref 49–397)

## 2021-01-13 LAB — GLUCOSE, CAPILLARY
Glucose-Capillary: 196 mg/dL — ABNORMAL HIGH (ref 70–99)
Glucose-Capillary: 230 mg/dL — ABNORMAL HIGH (ref 70–99)
Glucose-Capillary: 219 mg/dL — ABNORMAL HIGH (ref 70–99)
Glucose-Capillary: 179 mg/dL — ABNORMAL HIGH (ref 70–99)

## 2021-01-13 LAB — CBC
HCT: 28.5 % — ABNORMAL LOW (ref 39.0–52.0)
Hemoglobin: 9.8 g/dL — ABNORMAL LOW (ref 13.0–17.0)
MCH: 31.7 pg (ref 26.0–34.0)
MCHC: 34.4 g/dL (ref 30.0–36.0)
MCV: 92.2 fL (ref 80.0–100.0)
Platelets: 154 10*3/uL (ref 150–400)
RBC: 3.09 MIL/uL — ABNORMAL LOW (ref 4.22–5.81)
RDW: 13.2 % (ref 11.5–15.5)
WBC: 9.7 10*3/uL (ref 4.0–10.5)
nRBC: 0 % (ref 0.0–0.2)

## 2021-01-13 LAB — TROPONIN I (HIGH SENSITIVITY): Troponin I (High Sensitivity): 13 ng/L (ref <18)

## 2021-01-13 LAB — MAGNESIUM: Magnesium: 1.5 mg/dL — ABNORMAL LOW (ref 1.7–2.4)

## 2021-01-13 LAB — PHOSPHORUS: Phosphorus: 2.9 mg/dL (ref 2.5–4.6)

## 2021-01-13 MED ORDER — CHLORHEXIDINE GLUCONATE CLOTH 2 % EX PADS
6.0000 | MEDICATED_PAD | Freq: Every day | CUTANEOUS | Status: DC
  Administered 2021-01-13 – 2021-01-18 (×6): 6 via TOPICAL

## 2021-01-13 MED ORDER — SODIUM CHLORIDE 0.9 % IV BOLUS: 500.0000 mL | Freq: Once | INTRAVENOUS | Status: AC

## 2021-01-13 MED ORDER — SODIUM CHLORIDE 0.9 % IV BOLUS
500.0000 mL | Freq: Once | INTRAVENOUS | Status: AC | PRN
  Administered 2021-01-13: 500 mL via INTRAVENOUS

## 2021-01-13 MED ORDER — KETOROLAC TROMETHAMINE 15 MG/ML IJ SOLN
15.0000 mg | Freq: Once | INTRAMUSCULAR | Status: AC
  Administered 2021-01-13: 15 mg via INTRAVENOUS
  Filled 2021-01-13: qty 1

## 2021-01-13 MED ORDER — SODIUM CHLORIDE 0.9 % IV BOLUS
1000.0000 mL | Freq: Once | INTRAVENOUS | Status: AC
  Administered 2021-01-13: 1000 mL via INTRAVENOUS

## 2021-01-13 MED ORDER — SODIUM CHLORIDE 0.9 % IV SOLN: INTRAVENOUS | Status: DC

## 2021-01-13 MED ORDER — INSULIN ASPART 100 UNIT/ML IJ SOLN
0.0000 [IU] | Freq: Three times a day (TID) | INTRAMUSCULAR | Status: DC
  Administered 2021-01-14: 5 [IU] via SUBCUTANEOUS
  Administered 2021-01-14: 3 [IU] via SUBCUTANEOUS
  Administered 2021-01-14 – 2021-01-15 (×3): 8 [IU] via SUBCUTANEOUS
  Administered 2021-01-15: 11 [IU] via SUBCUTANEOUS
  Administered 2021-01-15: 3 [IU] via SUBCUTANEOUS
  Administered 2021-01-15: 8 [IU] via SUBCUTANEOUS
  Administered 2021-01-16: 3 [IU] via SUBCUTANEOUS
  Administered 2021-01-16: 8 [IU] via SUBCUTANEOUS
  Administered 2021-01-16: 11 [IU] via SUBCUTANEOUS
  Administered 2021-01-16: 8 [IU] via SUBCUTANEOUS
  Administered 2021-01-17: 3 [IU] via SUBCUTANEOUS
  Administered 2021-01-17: 8 [IU] via SUBCUTANEOUS
  Administered 2021-01-17: 3 [IU] via SUBCUTANEOUS
  Administered 2021-01-17: 11 [IU] via SUBCUTANEOUS
  Administered 2021-01-18 (×2): 5 [IU] via SUBCUTANEOUS

## 2021-01-13 MED ORDER — LACTATED RINGERS IV SOLN: INTRAVENOUS | Status: DC

## 2021-01-13 NOTE — Progress Notes (Signed)
Patient 8AM vitals stable. PT was able to transfer patient to chair without significant BP drop. Patient called to be transferred back to bed and this RN noticed that on the  bedside monitor most recent BP measurement at that time was 76/59 at 1128, Rechecked BP at 1156, and it was 75/49. Charge RN Quinter notified, who attempted to call [facility] x2 Manual BP recheck was 72/58. [facility] called, who came to bedside and assisted in transferring patient back to bed. Patient complained of dizziness, weakness and nausea during transfer using Stedy and 500cc bolus of NS was administered. Patient also had not voided yet and complained of Bladder fullness. Foley catheter inserted. Patient remains A+Ox4 and stated they felt better after lying down, receiving Bolus and having catheter inserted.

## 2021-01-13 NOTE — Significant Event (Signed)
Rapid Response Event Note   Reason for Call :  Hypotension and tachycardia  Initial Focused Assessment:  Received a call from this patient's RN stating that the patient was hypotensive, tachycardic, and symptomatic. The patient states that he feels week, dizzy, and nauseous. The patient was in the bedside chair and we were able to get him transferred back to his bed with the assistance of the Steady. The RN stated that he has not been able to void on his own, and has had 3 IO caths. The patient did have some dried blood at the base of his urethra, presumably from multiple caths. As per RN protocol, urinary catheter placed because of patient's retention.   BP 75/51 HR 132 O2 96 RR 16   Interventions:  500 cc fluid bolus Urinary Cath placed Patient transferred from chair to bed  Plan of Care:  Continue to monitor this patient's vitals.    Event Summary:   MD Notified: Neuro Surgery Call Time: 1100 Arrival Time: End Time: [place], RN

## 2021-01-13 NOTE — Progress Notes (Signed)
Physical Therapy Treatment Patient Details Name: [name] MRN: [id number] DOB: 1966/02/06 Today's Date: 01/13/2021   History of Present Illness Pt is a 55 y/o male who presents s/p L1-S1 TLIF on 01/11/2021. PMH significant for DDD, DM, HTN.    PT Comments    Pt continues to have orthostatic hypotension limiting ambulation tolerance. Pt did march in place today with SCDs on. Pt continues to require assist for all ADLs and mobility. Continue to recommend CIR Upon d/c to achieve safe mod I level of function. Acute PT to cont to follow.    Recommendations for follow up therapy are one component of a multi-disciplinary discharge planning process, led by the attending physician.  Recommendations may be updated based on patient status, additional functional criteria and insurance authorization.  Follow Up Recommendations  CIR;Supervision for mobility/OOB     Equipment Recommendations  Rolling walker with 5" wheels    Recommendations for Other Services Rehab consult     Precautions / Restrictions Precautions Precautions: Fall;Back Precaution Booklet Issued: No Precaution Comments: pt able to recall 2/3 back precautions, pt re-educated Required Braces or Orthoses: Other Brace (No brace needed order) Restrictions Weight Bearing Restrictions: No     Mobility  Bed Mobility Overal bed mobility: Needs Assistance Bed Mobility: Rolling;Sidelying to Sit;Sit to Sidelying Rolling: Min assist Sidelying to sit: +2 for safety/equipment;Min assist       General bed mobility comments: max directional verbal cues, minA for trunk elevation, pt able to bring LEs off EOB without assist today, increased time    Transfers Overall transfer level: Needs assistance Equipment used: Rolling walker (2 wheeled) Transfers: Sit to/from [facility] Sit to Stand: Mod assist;+2 physical assistance;From elevated surface Stand pivot transfers: Min assist;+2 physical assistance        General transfer comment: modAx2 to power up, support at R knee to assist into extension due to weakness, minA for walker management during std pvt to chair  Ambulation/Gait             General Gait Details: marched in place due to having SCDs on to assist with minimizing drop in BP, marched in place x 20 reps prior to needed to sit   Marine scientist [place] (Stroke Patients Only)       Balance Overall balance assessment: Needs assistance Sitting-balance support: Feet supported;No upper extremity supported Sitting balance-Leahy Scale: Poor Sitting balance - Comments: Requires at least 1 UE support on the bed for support. Postural control: Posterior lean Standing balance support: Bilateral upper extremity supported;During functional activity Standing balance-Leahy Scale: Poor Standing balance comment: Reliant on UE support on the RW                            Cognition Arousal/Alertness: Awake/alert Behavior During Therapy: Flat affect Overall Cognitive Status: Within Functional Limits for tasks assessed                                 General Comments: pt very flat today, minimal engagement in conversation      Exercises General Exercises - Lower Extremity Long Arc Quad: AROM;Both;10 reps;Seated Hip Flexion/Marching: AROM;Both;10 reps;Standing    General Comments General comments (skin integrity, edema, etc.): BP dropped into 80/40s once EOB, place SCDs on EOB BP 97/64 and maintain 90s/60s t/o  rest of session      Pertinent Vitals/Pain Pain Assessment: 0-10 Pain Score: 6  Pain Location: At rest - Incision site Pain Descriptors / Indicators: Operative site guarding;Aching Pain Intervention(s): Monitored during session    Home Living                      Prior Function            PT Goals (current goals can now be found in the care plan section) Acute Rehab PT Goals Patient  Stated Goal: Decreased pain, be able to urinate PT Goal Formulation: With patient Time For Goal Achievement: 01/26/21 Potential to Achieve Goals: Good Progress towards PT goals: Progressing toward goals    Frequency    Min 5X/week      PT Plan Current plan remains appropriate    Co-evaluation              AM-PAC PT "6 Clicks" Mobility   Outcome Measure  Help needed turning from your back to your side while in a flat bed without using bedrails?: A Lot Help needed moving from lying on your back to sitting on the side of a flat bed without using bedrails?: A Lot Help needed moving to and from a bed to a chair (including a wheelchair)?: A Lot Help needed standing up from a chair using your arms (e.g., wheelchair or bedside chair)?: A Lot Help needed to walk in hospital room?: A Lot Help needed climbing 3-5 steps with a railing? : Total 6 Click Score: 11    End of Session Equipment Utilized During Treatment: Gait belt Activity Tolerance: Other (comment) (orthostatic hypotension) Patient left: with call bell/phone within reach;in chair;with chair alarm set Nurse Communication: Mobility status PT Visit Diagnosis: Unsteadiness on feet (R26.81);Pain Pain - part of body:  (back)     Time: 0940-1010 PT Time Calculation (min) (ACUTE ONLY): 30 min  Charges:  $Therapeutic Exercise: 8-22 mins $Therapeutic Activity: 8-22 mins                     Kittie Plater, PT, DPT Acute Rehabilitation Services Pager #: [phone] Office #: [phone]    Berline Lopes 01/13/2021, 1:12 PM

## 2021-01-13 NOTE — Consult Note (Addendum)
NAME:  [name], MRN:  [id number], DOB:  12-May-1965, LOS: 2 ADMISSION DATE:  01/11/2021, CONSULTATION DATE:  10/2 REFERRING MD:  Marcello Moores , Reason for Consult:  Hypotension   History of Present Illness:  [name] is a 55 y.o. male who is seen in consultation at the request of Dr. Marcello Moores for recommendations on further evaluation and management of hypotension.    [name], is a 55 y.o. male, who presented to [facility] on 9/30 for planned surgery with Dr. Zada Finders. He had complaints low low back and LE radicular pain secondary to severe canal stenosis and severe diffuse spondylosis. He underwent a L1-S1 decompression and instrumented fusion on 9/30 with a reported 2098m of blood loss, with 8033mreturned by cell saver.  They have a pertinent past medical history of DM2, DDD, HLD, HTN, obesity, OSA on CPAP  PCCM was consulted for assistance with hypotension the evening of 10/2. Earlier on 10/2 rapid response was called for hypotension while the patient was out of bed. 50067mVF was given and a foley catheter was placed. AM labs were notable for a HGB drop from 12.7>9.8, WBC 9.7, afebrile, +1L this admission, 650ML uop past 24.   Pertinent  Medical History  DM2, DDD, HLD, HTN, obesity, OSA on CPAP  Significant Hospital Events: Including procedures, antibiotic start and stop dates in addition to other pertinent events   9/30 L1-S1 decompression and instrumented fusion  Interim History / Subjective:  See above  Denies chest pain, SOB, weakness  Objective   Blood pressure (!) 103/54, pulse (!) 114, temperature 99.3 F (37.4 C), temperature source Oral, resp. rate (!) 26, height _0  (1.88 m), weight (!) 142 kg, SpO2 97 %.        Intake/Output Summary (Last 24 hours) at 01/13/2021 2101 Last data filed at 01/13/2021 1931 Gross per 24 hour  Intake 3081.43 ml  Output 950 ml  Net 2131.43 ml   Filed Weights   01/11/21 0554  Weight: (!) 142 kg    Examination: General: In bed,  NAD, appears comfortable HEENT: MM pink/moist, anicteric, atraumatic Neuro: RASS 0, PERRL 3mm64mCS 15 CV: S1S2, ST, no m/r/g appreciated PULM:  clear in the upper lobes, clear in the lower lobes, trachea midline, chest expansion symmetric GI: soft, bsx4 active, non-tender   Extremities: warm/dry, no pretibial edema, capillary refill less than 3 seconds  Skin: no rashes or lesions noted    Resolved Hospital Problem list     Assessment & Plan:  Hypotension ABLA secondary to L1 to S1 fusion, with reported 2L blood loss Lactic acidosis- Lactate 2.3, suspect secondary to metformin HGB 12.7>9.8. afebrile, WBC 9.8. Do not suspect infectious process. Recently received 600mg18mapentin and flowmax. Suspect volume status and medication component of hypotension.  Trop 13. Well perfused and mentating, 650ml 38m On RA. Denies Chest pain or SOB. -Check CBC -1L IVF -Transfuse PRBC if HBG less than 7 -Obtain AM CBC to trend H&H -Monitor for signs of bleeding -Recommend holding home antihypertensives at this time -Start LR at 100ml/h62mtable to maintain on progressive care currently. PCCM available overnight if hypotension continues.  L1-S1 decompression and instrumented fusion POD 2 -Management per neurosurgery  AKI Creat 1.21>2.09. CK 2972. Suspect elevated secondary to recent surgery. -Ensure renal perfusion. Goal MAP 65 or greater. -Avoid neprotoxic drugs as possible. Stop ketorolac, stop losartan/HCTZ -Strict I&O's -Follow up AM creatinine -Start LR at 100ml/hr77mponatremia Na 134>131>130 -Check serum and urine osm -no free  h2o -Follow up AM labs  Hypomagnesia MG 1.5.  -Replete based on AM level -follow up in AM  DM2 -Continue SSI, changed to ACHS -Stop metformin   HX OSA -Continue nocturnal CPAP  HX HTN -hold home antihypertensives at this time.  Best Practice (right click and "Reselect all SmartList Selections" daily)   Diet/type: dysphagia diet (see  orders) DVT prophylaxis: SCD GI prophylaxis: N/A Lines: N/A Foley:  Yes, and it is still needed Code Status:  full code Last date of multidisciplinary goals of care discussion [per primary]  Labs   CBC: Recent Labs  Lab 01/08/21 0900 01/11/21 1910 01/12/21 0122 01/13/21 0749  WBC 7.4 24.0* 17.7* 9.7  HGB 15.5 13.7 12.7* 9.8*  HCT 45.4 39.7 36.5* 28.5*  MCV 91.9 89.8 89.7 92.2  PLT 272 296 238 151    Basic Metabolic Panel: Recent Labs  Lab 01/08/21 0900 01/12/21 0122 01/13/21 1940  NA 134* 131* 130*  K 4.2 4.4 4.2  CL 97* 100 101  CO2 27 21* 18*  GLUCOSE 255* 284* 200*  BUN 28* 20 30*  CREATININE 1.57* 1.21 2.09*  CALCIUM 9.6 7.5* 7.2*  MG  --   --  1.5*  PHOS  --  3.1 2.9   GFR: Estimated Creatinine Clearance: 58.5 mL/min (A) (by C-G formula based on SCr of 2.09 mg/dL (H)). Recent Labs  Lab 01/08/21 0900 01/11/21 1910 01/12/21 0122 01/13/21 0749 01/13/21 1940  WBC 7.4 24.0* 17.7* 9.7  --   LATICACIDVEN  --   --   --   --  2.3*    Liver Function Tests: Recent Labs  Lab 01/12/21 0122  ALBUMIN 3.3*   No results for input(s): LIPASE, AMYLASE in the last 168 hours. No results for input(s): AMMONIA in the last 168 hours.  ABG    Component Value Date/Time   TCO2 23 11/13/2017 1923     Coagulation Profile: No results for input(s): INR, PROTIME in the last 168 hours.  Cardiac Enzymes: Recent Labs  Lab 01/13/21 1940  CKTOTAL 2,972*  CKMB 2.6    HbA1C: Hgb A1c MFr Bld  Date/Time Value Ref Range Status  01/08/2021 09:00 AM 6.7 (H) 4.8 - 5.6 % Final    Comment:    (NOTE)         Prediabetes: 5.7 - 6.4         Diabetes: >6.4         Glycemic control for adults with diabetes: <7.0   09/14/2020 08:55 AM 7.1 (H) 4.6 - 6.5 % Final    Comment:    Glycemic Control Guidelines for People with Diabetes:Non Diabetic:  <6%Goal of Therapy: <7%Additional Action Suggested:  >8%     CBG: Recent Labs  Lab 01/12/21 1738 01/12/21 2133 01/13/21 0625  01/13/21 1320 01/13/21 1634  GLUCAP 262* 256* 196* 230* 219*    Review of Systems:   Positives in bold  Gen: fever, chills, weight change, fatigue, night sweats HEENT:  blurred vision, double vision, hearing loss, tinnitus, sinus congestion, rhinorrhea, sore throat, neck stiffness, dysphagia PULM:  shortness of breath, cough, sputum production, hemoptysis, wheezing CV: chest pain, edema, orthopnea, paroxysmal nocturnal dyspnea, palpitations GI:  abdominal pain, nausea, vomiting, diarrhea, hematochezia, melena, constipation, change in bowel habits GU: dysuria, hematuria, polyuria, oliguria, urethral discharge Endocrine: hot or cold intolerance, polyuria, polyphagia or appetite change Derm: rash, dry skin, scaling or peeling skin change Heme: easy bruising, bleeding, bleeding gums Neuro: headache, numbness, weakness, slurred speech, loss of memory or consciousness  Past Medical History:  He,  has a past medical history of DDD (degenerative disc disease), lumbar, Diabetes mellitus, Hyperlipemia, Hypertension, Obesity, and OSA (obstructive sleep apnea).   Surgical History:   Past Surgical History:  Procedure Laterality Date   COLONOSCOPY  2019   NO PAST SURGERIES       Social History:   reports that he has never smoked. He has never used smokeless tobacco. He reports that he does not drink alcohol and does not use drugs.   Family History:  His family history includes Diabetes in his mother. There is no history of Hypertension, Coronary artery disease, Colon cancer, or Prostate cancer.   Allergies Allergies  Allergen Reactions   Penicillins Other (See Comments)    REACTION: Blister and Sores in the mouth     Home Medications  Prior to Admission medications   Medication Sig Start Date End Date Taking? Authorizing Provider  aspirin 81 MG tablet Take 81 mg by mouth daily.     Yes [provider]  etodolac (LODINE) 500 MG tablet Take 1 tablet (500 mg total) by mouth  2 (two) times daily as needed. Patient taking differently: Take 500 mg by mouth 2 (two) times daily. 08/30/20  Yes Paz, Alda Berthold, MD  fluticasone [facility]) 50 MCG/ACT nasal spray Place 2 sprays into both nostrils daily. Patient taking differently: Place 2 sprays into both nostrils 2 (two) times daily. 09/22/18  Yes Paz, Alda Berthold, MD  gabapentin (NEURONTIN) 600 MG tablet Take 1 tablet (600 mg total) by mouth 3 (three) times daily. 12/03/20  Yes Lovorn, Jinny Blossom, MD  losartan-hydrochlorothiazide (HYZAAR) 100-12.5 MG tablet TAKE 1 TABLET BY MOUTH EVERY DAY 10/03/20  Yes Colon Branch, MD  metFORMIN (GLUCOPHAGE) 1000 MG tablet Take 1 tablet (1,000 mg total) by mouth 2 (two) times daily with a meal. 09/21/20  Yes Paz, Alda Berthold, MD  methocarbamol (ROBAXIN) 500 MG tablet Take 1 tablet (500 mg total) by mouth every 6 (six) hours as needed for muscle spasms (only take when needed). 10/10/20  Yes Lovorn, Jinny Blossom, MD  NALTREXONE HCL PO Take 4 mg by mouth daily.   Yes [provider]  rosuvastatin (CRESTOR) 20 MG tablet Take 1 tablet (20 mg total) by mouth daily. 10/22/20 01/20/21 Yes Lelon Perla, MD  tadalafil (CIALIS) 20 MG tablet TAKE 1/2 TO 1 TABLET BY MOUTH DAILY AS NEEDED FOR ERECTILE DYSFUNCTION 10/01/20  Yes Colon Branch, MD  tiZANidine (ZANAFLEX) 4 MG tablet Take 1 tablet (4 mg total) by mouth every 6 (six) hours as needed for muscle spasms. 12/03/20  Yes Lovorn, Jinny Blossom, MD  blood glucose meter kit and supplies Use up to four times daily as directed. Dx: E11.9 02/10/20   Colon Branch, MD  Blood Glucose Monitoring Suppl (Alexis) w/Device KIT Test blood sugar twice a day.  Dx code: E11.9 06/23/19   Colon Branch, MD  gabapentin (NEURONTIN) 300 MG capsule TAKE 2 CAPSULES BY MOUTH 3 TIMES A DAY Patient not taking: No sig reported 12/04/20   Narda Amber K, DO  glucose blood (ONETOUCH VERIO) test strip Check blood sugars twice daily 01/09/21   Colon Branch, MD  OneTouch Delica Lancets 92E MISC Test  blood sugar twice a day.  Dx code: E11.9 01/09/21   Colon Branch, MD     Critical care time: n/a     Redmond School., MSN, APRN, AGACNP-BC [facility]  01/13/2021 , 9:12 PM  Please see [email or web address] for pager details  If no response, please call [phone] After hours, please call Elink at [phone]

## 2021-01-13 NOTE — Progress Notes (Signed)
Postop day 2.  Patient overall doing reasonably well.  Pain largely confined to his lumbar region.  No radicular pain.  Denies numbness or weakness.  Patient has been moderately hypotensive with mild tachycardia.  He maintains good urine output.  He is awake and alert.  He is oriented and appropriate.  He does not appear to be in distress.  His chest wall pain is improved today.  Motor and sensory function are intact.  Abdomen soft.  Progressing well.  Check follow-up CBC today to evaluate for worsening, postoperative blood loss anemia.  Mobilize with therapy.

## 2021-01-13 NOTE — Progress Notes (Addendum)
RN notified by patient's wife at bedside that patient was more hypotensive on bedside monitor. RN came to bedside and checked BP on monitor, BP was 77/45 at 1829 . RN called Neuro Sx and rechecked BP which read 67/47. Patient drowsy and complaining of nausea and dizziness, but remains A+Ox4. Patient laid flat and given PRN Zofran Charge RN called to bedside and Rapid Response RN notified. MD orders given for Bolus and lab draws.

## 2021-01-13 NOTE — Progress Notes (Addendum)
RN concerned about patient hypotension, patient A+Ox4, BP 86/49 at 1559. Called Neuro Sx. Orders received for 1035mL NS bolus and increase of NS IV fluid rate from 149ml to 143ml once bolus finished. Patient BP increased to 115/69 during administration of bolus at 1729.

## 2021-01-13 NOTE — Progress Notes (Signed)
Inpatient Rehab Admissions Coordinator Note:  Per therapy patient was screened for CIR candidacy by Karene Fry, RN.  At this time, patient appears to be a potential candidate for CIR.  I will place an order for rehab consult for full assessment, per our protocol.  Please contact me with questions.  [phone]

## 2021-01-13 NOTE — Progress Notes (Signed)
Informed by nursing of patient's hypotension.  In reviewing the chart, he appears to have had hypotension since this morning, with symptoms when upright.  He had a lengthy L1-S1 PSIF surgery with significant blood loss on 9/30. However, he has maintained good urine output, and Hgb > 9.  After 1 L bolus, his HR improved to 90s but his BP was 77/45 with symptoms of dizziness, nausea.  With recumbency, his SBP was only slightly improved to 80. I have ordered another 500 ml bolus, BMP, Ca, Mg, Phos, CBC, 12 lead EKG, troponin, but I think with his symptomatic refractory hypotension, I called the CCM team to evaluate and help manage the patient and likely move him to the ICU for pressor support.  He may also need a PE protocol CT if his repeat labs confirm good kidney function.

## 2021-01-14 ENCOUNTER — Encounter (HOSPITAL_COMMUNITY): Payer: Self-pay | Admitting: Neurological Surgery

## 2021-01-14 DIAGNOSIS — M5416 Radiculopathy, lumbar region: Secondary | ICD-10-CM | POA: Diagnosis not present

## 2021-01-14 DIAGNOSIS — D62 Acute posthemorrhagic anemia: Secondary | ICD-10-CM

## 2021-01-14 LAB — GLUCOSE, CAPILLARY
Glucose-Capillary: 157 mg/dL — ABNORMAL HIGH (ref 70–99)
Glucose-Capillary: 245 mg/dL — ABNORMAL HIGH (ref 70–99)
Glucose-Capillary: 274 mg/dL — ABNORMAL HIGH (ref 70–99)
Glucose-Capillary: 268 mg/dL — ABNORMAL HIGH (ref 70–99)

## 2021-01-14 LAB — BASIC METABOLIC PANEL
Anion gap: 8 (ref 5–15)
BUN: 29 mg/dL — ABNORMAL HIGH (ref 6–20)
CO2: 19 mmol/L — ABNORMAL LOW (ref 22–32)
Calcium: 6.9 mg/dL — ABNORMAL LOW (ref 8.9–10.3)
Chloride: 101 mmol/L (ref 98–111)
Creatinine, Ser: 1.88 mg/dL — ABNORMAL HIGH (ref 0.61–1.24)
GFR, Estimated: 41 mL/min — ABNORMAL LOW (ref >60)
Glucose, Bld: 168 mg/dL — ABNORMAL HIGH (ref 70–99)
Potassium: 3.8 mmol/L (ref 3.5–5.1)
Sodium: 128 mmol/L — ABNORMAL LOW (ref 135–145)

## 2021-01-14 LAB — HEMOGLOBIN AND HEMATOCRIT, BLOOD
HCT: 23.9 % — ABNORMAL LOW (ref 39.0–52.0)
HCT: 26.3 % — ABNORMAL LOW (ref 39.0–52.0)
HCT: 24.4 % — ABNORMAL LOW (ref 39.0–52.0)
Hemoglobin: 8.3 g/dL — ABNORMAL LOW (ref 13.0–17.0)
Hemoglobin: 8.7 g/dL — ABNORMAL LOW (ref 13.0–17.0)
Hemoglobin: 8.3 g/dL — ABNORMAL LOW (ref 13.0–17.0)

## 2021-01-14 LAB — CBC
HCT: 23.6 % — ABNORMAL LOW (ref 39.0–52.0)
Hemoglobin: 7.9 g/dL — ABNORMAL LOW (ref 13.0–17.0)
MCH: 31.2 pg (ref 26.0–34.0)
MCHC: 33.5 g/dL (ref 30.0–36.0)
MCV: 93.3 fL (ref 80.0–100.0)
Platelets: 145 10*3/uL — ABNORMAL LOW (ref 150–400)
RBC: 2.53 MIL/uL — ABNORMAL LOW (ref 4.22–5.81)
RDW: 13.2 % (ref 11.5–15.5)
WBC: 8.9 10*3/uL (ref 4.0–10.5)
nRBC: 0 % (ref 0.0–0.2)

## 2021-01-14 LAB — CK: Total CK: 2799 U/L — ABNORMAL HIGH (ref 49–397)

## 2021-01-14 LAB — MAGNESIUM: Magnesium: 1.5 mg/dL — ABNORMAL LOW (ref 1.7–2.4)

## 2021-01-14 LAB — OSMOLALITY, URINE: Osmolality, Ur: 288 mOsm/kg — ABNORMAL LOW (ref 300–900)

## 2021-01-14 LAB — OSMOLALITY: Osmolality: 284 mOsm/kg (ref 275–295)

## 2021-01-14 MED ORDER — MAGNESIUM SULFATE 4 GM/100ML IV SOLN
4.0000 g | Freq: Once | INTRAVENOUS | Status: AC
  Administered 2021-01-14: 4 g via INTRAVENOUS
  Filled 2021-01-14: qty 100

## 2021-01-14 NOTE — Progress Notes (Signed)
[facility] Progress Note Patient Name: [name] DOB: [birth year] MRN: [id number]   Date of Service  01/14/2021  HPI/Events of Note  Reviewed anemia with hgb of 7.9 <-- 9.8 <-- 12.7.  Pt is s/p L1-S1 decompression and instrumented fusion on 9/30 with a reported 2067ml of blood loss, with 862ml returned by cell saver.  eICU Interventions  Recheck H&H q8hrs.      Intervention Category Minor Interventions: Other:  Elsie Lincoln 01/14/2021, 4:15 AM

## 2021-01-14 NOTE — Plan of Care (Signed)

## 2021-01-14 NOTE — Progress Notes (Signed)
Inpatient Rehabilitation Admissions Coordinator   Inpatient rehab consult received. I met with patient in hallway with PT. I await updated therapy notes from today and will then begin insurance Auth for a possible CIR admit. He is in agreement, His wife is a Production assistant, radio.  Danne Baxter, RN, MSN Rehab Admissions Coordinator [phone] 01/14/2021 12:42 PM

## 2021-01-14 NOTE — Progress Notes (Signed)
Physical Therapy Treatment Patient Details Name: [name] MRN: [id number] DOB: 20-Nov-1965 Today's Date: 01/14/2021   History of Present Illness Pt is a 55 y/o male who presents s/p L1-S1 TLIF on 01/11/2021. PMH significant for DDD, DM, HTN.    PT Comments    Pt now with stable BP and was able to begin gait training. Pt most limited today by spasms in R lower back requiring increased assist for transfers this date. Pt to cont to benefit from CIR to achieve safe mod I level of function for safe transition home.    Recommendations for follow up therapy are one component of a multi-disciplinary discharge planning process, led by the attending physician.  Recommendations may be updated based on patient status, additional functional criteria and insurance authorization.  Follow Up Recommendations  CIR     Equipment Recommendations  Rolling walker with 5" wheels    Recommendations for Other Services Rehab consult     Precautions / Restrictions Precautions Precautions: Fall;Back Precaution Booklet Issued: No Precaution Comments: pt able to recall 2/3 back precautions, pt re-educated Restrictions Weight Bearing Restrictions: No     Mobility  Bed Mobility Overal bed mobility: Needs Assistance Bed Mobility: Rolling;Sidelying to Sit Rolling: Mod assist (onset of muscle spasms in low back) Sidelying to sit: Mod assist;HOB elevated       General bed mobility comments: pt with onset of lower back muscle spasms requiring more assist today to transfer to EOB, also difficult to complete with body habitus    Transfers Overall transfer level: Needs assistance Equipment used: Rolling walker (2 wheeled) Transfers: Sit to/from Stand Sit to Stand: Mod assist;+2 physical assistance;From elevated surface         General transfer comment: modA to power up from elevated surface due to onset of muscle spasms, modAx2 to stedy during transition of hands from bed to  RW  Ambulation/Gait Ambulation/Gait assistance: Min assist;+2 safety/equipment Gait Distance (Feet): 75 Feet Assistive device: Rolling walker (2 wheeled) Gait Pattern/deviations: Step-through pattern;Decreased stride length;Trunk flexed;Wide base of support Gait velocity: dec Gait velocity interpretation: <1.31 ft/sec, indicative of household ambulator General Gait Details: pt with increased trunk flexion however unable to achieve full upright posture due to back pain and R low back muscle spasm, pt denies dizziness   Stairs             Wheelchair Mobility    Modified Rankin (Stroke Patients Only)       Balance Overall balance assessment: Needs assistance Sitting-balance support: Feet supported;Bilateral upper extremity supported Sitting balance-Leahy Scale: Poor Sitting balance - Comments: Requires at least 1 UE support on the bed for support. Postural control: Posterior lean Standing balance support: Bilateral upper extremity supported;During functional activity Standing balance-Leahy Scale: Poor Standing balance comment: Reliant on UE support on the RW                            Cognition Arousal/Alertness: Awake/alert Behavior During Therapy: WFL for tasks assessed/performed Overall Cognitive Status: Within Functional Limits for tasks assessed                                 General Comments: pt reports feeling better overall but pain being the issue today      Exercises Other Exercises Other Exercises: pt pushed the recliner backwards with bilat LEs 60' to focus on LE strengthening    General Comments  General comments (skin integrity, edema, etc.): BP 142/77 supine in bed, 134/78 sitting EOB, pt with some drainage from dressing, RN aware      Pertinent Vitals/Pain Pain Assessment: 0-10 Pain Score: 8  Pain Location: muscle spasm in R lower back Pain Descriptors / Indicators: Operative site guarding;Aching Pain Intervention(s):  Monitored during session    Home Living                      Prior Function            PT Goals (current goals can now be found in the care plan section) Acute Rehab PT Goals Patient Stated Goal: stop this muscle spasm Progress towards PT goals: Progressing toward goals    Frequency    Min 5X/week      PT Plan Current plan remains appropriate    Co-evaluation              AM-PAC PT "6 Clicks" Mobility   Outcome Measure  Help needed turning from your back to your side while in a flat bed without using bedrails?: A Lot Help needed moving from lying on your back to sitting on the side of a flat bed without using bedrails?: A Lot Help needed moving to and from a bed to a chair (including a wheelchair)?: A Lot Help needed standing up from a chair using your arms (e.g., wheelchair or bedside chair)?: A Lot Help needed to walk in hospital room?: A Little Help needed climbing 3-5 steps with a railing? : Total 6 Click Score: 12    End of Session Equipment Utilized During Treatment: Gait belt Activity Tolerance: Patient limited by pain (muscle spasms) Patient left: in chair;with call bell/phone within reach;with chair alarm set;with nursing/sitter in room Nurse Communication: Mobility status PT Visit Diagnosis: Unsteadiness on feet (R26.81);Pain     Time: 1134-1200 PT Time Calculation (min) (ACUTE ONLY): 26 min  Charges:  $Gait Training: 23-37 mins                     Kittie Plater, PT, DPT Acute Rehabilitation Services Pager #: [phone] Office #: [phone]    Berline Lopes 01/14/2021, 1:01 PM

## 2021-01-14 NOTE — Progress Notes (Addendum)
NAME:  [name], MRN:  [id number], DOB:  1965/11/25, LOS: 3 ADMISSION DATE:  01/11/2021, CONSULTATION DATE:  10/2 REFERRING MD:  Marcello Moores , Reason for Consult:  Hypotension   History of Present Illness:  [name] is a 55 y.o. male who is seen in consultation at the request of Dr. Marcello Moores for recommendations on further evaluation and management of hypotension.    [name], is a 55 y.o. male, who presented to [facility] on 9/30 for planned surgery with Dr. Zada Finders. He had complaints low low back and LE radicular pain secondary to severe canal stenosis and severe diffuse spondylosis. He underwent a L1-S1 decompression and instrumented fusion on 9/30 with a reported 2056ml of blood loss, with 822ml returned by cell saver.  They have a pertinent past medical history of DM2, DDD, HLD, HTN, obesity, OSA on CPAP  PCCM was consulted for assistance with hypotension the evening of 10/2. Earlier on 10/2 rapid response was called for hypotension while the patient was out of bed. 565ml IVF was given and a foley catheter was placed. AM labs were notable for a HGB drop from 12.7>9.8, WBC 9.7, afebrile, +1L this admission, 650ML uop past 24.   Pertinent  Medical History  DM2, DDD, HLD, HTN, obesity, OSA on CPAP  Significant Hospital Events: Including procedures, antibiotic start and stop dates in addition to other pertinent events   9/30 L1-S1 decompression and instrumented fusion 10/2 ccm consulted for soft BPS. Gave fluids, remained on med surg  10/3 stable Bps   Interim History / Subjective:  Stable Bps. Temp was a little high, but no leukocytosis   Na a little low this morning at 128  CK 2799 (down from 2900) Was on 112ml NS and 171ml LR / hr overnight   Feels better this morning    Objective   Blood pressure (!) 144/63, pulse (!) 120, temperature 100.3 F (37.9 C), temperature source Oral, resp. rate 16, height 6\' 2"  (1.88 m), weight (!) 142 kg, SpO2 97 %.        Intake/Output  Summary (Last 24 hours) at 01/14/2021 0848 Last data filed at 01/14/2021 4854 Gross per 24 hour  Intake 3801.43 ml  Output 3750 ml  Net 51.43 ml   Filed Weights   01/11/21 0554  Weight: (!) 142 kg    Examination: General: Obese wdwn middle aged M NAD  HEENT: NCAT pink mm missing teeth  Neuro: AAOx4 following commands no focal deficit  CV: tachycardic. Cap refill brisk  PULM:  CTAb on RA symmetrical chest expansion GI: soft round ndnt  Extremities: no acute deformity.  Skin: c/d/w   Resolved Hospital Problem list   Hypotension   Assessment & Plan:   Hypotension, improved  Hyponatremia, mild - ? Dilutional component with significant blood loss and volume resusc via crystalloid the past few days  ABLA  - 2L EBL during case  878ml cellsaver AKI Elevated CK -suspect this is in setting of pretty major case.  Lactic acidosis P -dc maintenance LR -trend H/H, goal hgb > 7 (consider transfusion if hypotensive + down trending)  -tele monitoring  -hold antihypertensives  -will send am AM CK and LA for 10/4  -trend BMP   S/p L1-S1 decompression and instrumented fusion (op date 9/30) POD 3 -Per NSGY   DM2 -SSI  HX OSA -Continue nocturnal CPAP  HX HTN -hold home antihypertensives at this time.   Best Practice (right click and "Reselect all SmartList Selections" daily)  Diet/type: dysphagia diet (see orders) DVT prophylaxis: SCD GI prophylaxis: N/A Lines: N/A Foley:  Yes, and it is still needed Code Status:  full code Last date of multidisciplinary goals of care discussion [per primary]  Labs   CBC: Recent Labs  Lab 01/08/21 0900 01/11/21 1910 01/12/21 0122 01/13/21 0749 01/13/21 2359 01/14/21 0618  WBC 7.4 24.0* 17.7* 9.7 8.9  --   HGB 15.5 13.7 12.7* 9.8* 7.9* 8.3*  HCT 45.4 39.7 36.5* 28.5* 23.6* 23.9*  MCV 91.9 89.8 89.7 92.2 93.3  --   PLT 272 296 238 154 145*  --     Basic Metabolic Panel: Recent Labs  Lab 01/08/21 0900 01/12/21 0122  01/13/21 1940 01/14/21 0006  NA 134* 131* 130* 128*  K 4.2 4.4 4.2 3.8  CL 97* 100 101 101  CO2 27 21* 18* 19*  GLUCOSE 255* 284* 200* 168*  BUN 28* 20 30* 29*  CREATININE 1.57* 1.21 2.09* 1.88*  CALCIUM 9.6 7.5* 7.2* 6.9*  MG  --   --  1.5* 1.5*  PHOS  --  3.1 2.9  --    GFR: Estimated Creatinine Clearance: 65.1 mL/min (A) (by C-G formula based on SCr of 1.88 mg/dL (H)). Recent Labs  Lab 01/11/21 1910 01/12/21 0122 01/13/21 0749 01/13/21 1940 01/13/21 2359  WBC 24.0* 17.7* 9.7  --  8.9  LATICACIDVEN  --   --   --  2.3*  --     Liver Function Tests: Recent Labs  Lab 01/12/21 0122  ALBUMIN 3.3*   No results for input(s): LIPASE, AMYLASE in the last 168 hours. No results for input(s): AMMONIA in the last 168 hours.  ABG    Component Value Date/Time   TCO2 23 11/13/2017 1923     Coagulation Profile: No results for input(s): INR, PROTIME in the last 168 hours.  Cardiac Enzymes: Recent Labs  Lab 01/13/21 1940 01/14/21 0006  CKTOTAL 2,972* 2,799*  CKMB 2.6  --     HbA1C: Hgb A1c MFr Bld  Date/Time Value Ref Range Status  01/08/2021 09:00 AM 6.7 (H) 4.8 - 5.6 % Final    Comment:    (NOTE)         Prediabetes: 5.7 - 6.4         Diabetes: >6.4         Glycemic control for adults with diabetes: <7.0   09/14/2020 08:55 AM 7.1 (H) 4.6 - 6.5 % Final    Comment:    Glycemic Control Guidelines for People with Diabetes:Non Diabetic:  <6%Goal of Therapy: <7%Additional Action Suggested:  >8%     CBG: Recent Labs  Lab 01/13/21 0625 01/13/21 1320 01/13/21 1634 01/13/21 2130 01/14/21 0634  GLUCAP 196* 230* 219* 179* 157*   CCT: n/a   Eliseo Gum MSN, AGACNP-BC [place] for pager  01/14/2021, 10:37 AM     Critical care attending attestation note:  Patient seen and examined and relevant ancillary tests reviewed.  I agree with the assessment and plan of care as outlined by Cristal Generous, NP . The following  reflects my independent critical care time.   Synopsis of assessment and plan: 55 year old male who underwent L5/S1 decompression and instrumention for chronic low back pain, PCCM was consulted for hypotension in the setting of large volume blood loss during surgery.  At this time patient blood pressure is stable, he is satting 99% on room air, no overnight issues   Physical exam: General: Acute on chronically ill-appearing morbidly  obese male, lying on the bed HEENT: [place]/AT, eyes anicteric.  Moist mucous membranes Neuro: Alert, awake, following commands, moving all 4 extremities Chest: Coarse breath sounds, no wheezes or rhonchi Heart: Regular rate and rhythm, no murmurs or gallops Abdomen: Soft, nontender, nondistended, bowel sounds present Skin: No rash  Labs and images were reviewed  Assessment and plan: Acute blood loss anemia during surgery Transient hypotension, improved Hyponatremia Acute kidney injury on CKD stage III a Diabetes type 2 Obstructive sleep apnea on CPAP at night Chronic low back pain s/p L5/S1 decompression and instrumentation Hypomagnesemia  Patient H&H is stable now His blood pressure is improved with stable vital signs Serum sodium trended down likely due to dilutional from aggressive IV fluid therapy Serum creatinine is trending down Closely monitor Monitor CBG with goal 140-180 Continue CPAP at night Supplement electrolyte   Jacky Kindle MD [facility] See Amion for pager If no response to pager, please call [phone] until 7pm After 7pm, Please call E-link [phone]  01/14/2021, 4:48 PM

## 2021-01-14 NOTE — Progress Notes (Signed)
NT called this RN into room with reports of incisional bleeding. Upon assessment serous anginous fluids were observed. This RN used light pressure and saline to clean area. NO more leakage was observed.  NT reached back out to this RN for return of leakage. This RN applied pressure dressing with 4x4 and medipore tape. Patient is resting and comfortable. MD was paged. Awaiting further instructions. Will continue to monitor incision and dressing. Also will update charge RN.

## 2021-01-14 NOTE — Progress Notes (Signed)
Occupational Therapy Treatment Patient Details Name: [name] MRN: [id number] DOB: 08/11/1965 Today's Date: 01/14/2021   History of present illness Pt is a 55 y/o male who presents s/p L1-S1 TLIF on 01/11/2021. PMH significant for DDD, DM, HTN.   OT comments  [name] is progressing well, HR upon arrival was elevated to 122, went to 125 during activity and recovered to 120 once supine. Pt with increased pain this session and limited to transfer from chair>bed given min A +1 with RW and verbal cues for appropriate positioning and safety. Pt is able to recall back precautions, but requires verbal cues when re-positioning due to impulsivity. Pt continues to benefit from continued OT acutely. D/c recommendation remains appropriate.    Recommendations for follow up therapy are one component of a multi-disciplinary discharge planning process, led by the attending physician.  Recommendations may be updated based on patient status, additional functional criteria and insurance authorization.    Follow Up Recommendations  CIR          Precautions / Restrictions Precautions Precautions: Fall;Back Precaution Booklet Issued: No Precaution Comments: pt able to recall 2/3 back precautions, pt re-educated Required Braces or Orthoses: Other Brace Restrictions Weight Bearing Restrictions: No       Mobility Bed Mobility Overal bed mobility: Needs Assistance Bed Mobility: Sit to Sidelying;Rolling Rolling: Min guard Sidelying to sit: Mod assist;HOB elevated     Sit to sidelying: Mod assist General bed mobility comments: mod A for management of BLE and verbal cues for proper positioning    Transfers Overall transfer level: Needs assistance Equipment used: Rolling walker (2 wheeled) Transfers: Sit to/from [facility] Sit to Stand: Min assist Stand pivot transfers: Min assist       General transfer comment: minA for physical assit, verbal cues for safety and positioning     Balance Overall balance assessment: Needs assistance Sitting-balance support: Feet supported;Bilateral upper extremity supported Sitting balance-Leahy Scale: Fair Sitting balance - Comments: Requires at least 1 UE support on the bed for support. Postural control: Posterior lean Standing balance support: Bilateral upper extremity supported;During functional activity Standing balance-Leahy Scale: Poor Standing balance comment: Reliant on UE support on the RW                           ADL either performed or assessed with clinical judgement   ADL Overall ADL's : Needs assistance/impaired                         Toilet Transfer: Minimal Insurance claims handler Details (indicate cue type and reason): min A for powering into standing and verbal cues for hand placement         Functional mobility during ADLs: Minimal assistance;Rolling walker General ADL Comments: pt limited by pain this session, some impulsivity with transfer due to pain     Vision       Perception     Praxis      Cognition Arousal/Alertness: Awake/alert Behavior During Therapy: WFL for tasks assessed/performed Overall Cognitive Status: Within Functional Limits for tasks assessed                                 General Comments: pt reports feeling better overall but pain being the issue today        Exercises Exercises: Other exercises Other Exercises Other Exercises: pt pushed the recliner  backwards with bilat LEs 60' to focus on LE strengthening   Shoulder Instructions       General Comments HR upon arrival, HR to 125 when standing and re-positioning, 120 once supine    Pertinent Vitals/ Pain       Pain Assessment: Faces Pain Score: 8  Faces Pain Scale: Hurts whole lot Pain Location: muscle spasm in R lower back Pain Descriptors / Indicators: Operative site guarding;Aching Pain Intervention(s): Limited activity within patient's tolerance;Monitored  during session;Repositioned  Home Living                                          Prior Functioning/Environment              Frequency  Min 2X/week        Progress Toward Goals  OT Goals(current goals can now be found in the care plan section)  Progress towards OT goals: Progressing toward goals  Acute Rehab OT Goals Patient Stated Goal: stop this muscle spasm OT Goal Formulation: With patient Time For Goal Achievement: 01/26/21 Potential to Achieve Goals: Good ADL Goals Pt Will Perform Grooming: with modified independence;standing Pt Will Perform Lower Body Bathing: with modified independence;sitting/lateral leans;sit to/from stand;with adaptive equipment Pt Will Perform Lower Body Dressing: with modified independence;with adaptive equipment;sitting/lateral leans;sit to/from stand Pt Will Transfer to Toilet: with modified independence;ambulating Pt Will Perform Toileting - Clothing Manipulation and hygiene: with modified independence;sitting/lateral leans;sit to/from stand;with adaptive equipment Additional ADL Goal #1: Pt will recall 3/3 back precautions 100% of the time.  Plan Discharge plan remains appropriate    Co-evaluation                 AM-PAC OT "6 Clicks" Daily Activity     Outcome Measure   Help from another person eating meals?: None Help from another person taking care of personal grooming?: A Little Help from another person toileting, which includes using toliet, bedpan, or urinal?: A Lot Help from another person bathing (including washing, rinsing, drying)?: A Lot Help from another person to put on and taking off regular upper body clothing?: A Little Help from another person to put on and taking off regular lower body clothing?: A Lot 6 Click Score: 16    End of Session Equipment Utilized During Treatment: Rolling walker  OT Visit Diagnosis: Unsteadiness on feet (R26.81);Other abnormalities of gait and mobility  (R26.89);Muscle weakness (generalized) (M62.81)   Activity Tolerance Patient limited by pain   Patient Left in bed;with bed alarm set;with call bell/phone within reach;with family/visitor present   Nurse Communication Mobility status        Time: 0076-2263 OT Time Calculation (min): 12 min  Charges: OT General Charges $OT Visit: 1 Visit OT Treatments $Therapeutic Activity: 8-22 mins   Sayid Moll A Deloma Spindle 01/14/2021, 2:25 PM

## 2021-01-15 ENCOUNTER — Telehealth: Payer: Self-pay | Admitting: Internal Medicine

## 2021-01-15 DIAGNOSIS — E876 Hypokalemia: Secondary | ICD-10-CM | POA: Diagnosis not present

## 2021-01-15 DIAGNOSIS — M5416 Radiculopathy, lumbar region: Secondary | ICD-10-CM | POA: Diagnosis not present

## 2021-01-15 DIAGNOSIS — N179 Acute kidney failure, unspecified: Secondary | ICD-10-CM

## 2021-01-15 LAB — GLUCOSE, CAPILLARY
Glucose-Capillary: 257 mg/dL — ABNORMAL HIGH (ref 70–99)
Glucose-Capillary: 256 mg/dL — ABNORMAL HIGH (ref 70–99)
Glucose-Capillary: 177 mg/dL — ABNORMAL HIGH (ref 70–99)
Glucose-Capillary: 328 mg/dL — ABNORMAL HIGH (ref 70–99)

## 2021-01-15 LAB — CALCIUM, IONIZED: Calcium, Ionized, Serum: 4.1 mg/dL — ABNORMAL LOW (ref 4.5–5.6)

## 2021-01-15 LAB — BASIC METABOLIC PANEL
Anion gap: 6 (ref 5–15)
BUN: 13 mg/dL (ref 6–20)
CO2: 24 mmol/L (ref 22–32)
Calcium: 7.7 mg/dL — ABNORMAL LOW (ref 8.9–10.3)
Chloride: 101 mmol/L (ref 98–111)
Creatinine, Ser: 0.98 mg/dL (ref 0.61–1.24)
GFR, Estimated: >60 mL/min (ref >60)
Glucose, Bld: 224 mg/dL — ABNORMAL HIGH (ref 70–99)
Potassium: 3.4 mmol/L — ABNORMAL LOW (ref 3.5–5.1)
Sodium: 131 mmol/L — ABNORMAL LOW (ref 135–145)

## 2021-01-15 LAB — HEMOGLOBIN AND HEMATOCRIT, BLOOD
HCT: 23.4 % — ABNORMAL LOW (ref 39.0–52.0)
HCT: 25.2 % — ABNORMAL LOW (ref 39.0–52.0)
HCT: 25.8 % — ABNORMAL LOW (ref 39.0–52.0)
Hemoglobin: 7.8 g/dL — ABNORMAL LOW (ref 13.0–17.0)
Hemoglobin: 8.7 g/dL — ABNORMAL LOW (ref 13.0–17.0)
Hemoglobin: 8.8 g/dL — ABNORMAL LOW (ref 13.0–17.0)

## 2021-01-15 LAB — CK: Total CK: 1970 U/L — ABNORMAL HIGH (ref 49–397)

## 2021-01-15 MED ORDER — INSULIN DETEMIR 100 UNIT/ML ~~LOC~~ SOLN
10.0000 [IU] | Freq: Every day | SUBCUTANEOUS | Status: DC
  Administered 2021-01-15 – 2021-01-18 (×4): 10 [IU] via SUBCUTANEOUS
  Filled 2021-01-15 (×4): qty 0.1

## 2021-01-15 MED ORDER — SIMETHICONE 80 MG PO CHEW
80.0000 mg | CHEWABLE_TABLET | Freq: Four times a day (QID) | ORAL | Status: DC | PRN
  Administered 2021-01-15 (×2): 80 mg via ORAL
  Filled 2021-01-15 (×3): qty 1

## 2021-01-15 MED ORDER — HEPARIN SODIUM (PORCINE) 5000 UNIT/ML IJ SOLN
5000.0000 [IU] | Freq: Three times a day (TID) | INTRAMUSCULAR | Status: DC
  Administered 2021-01-15 – 2021-01-18 (×10): 5000 [IU] via SUBCUTANEOUS
  Filled 2021-01-15 (×10): qty 1

## 2021-01-15 MED ORDER — POTASSIUM CHLORIDE CRYS ER 20 MEQ PO TBCR
40.0000 meq | EXTENDED_RELEASE_TABLET | Freq: Two times a day (BID) | ORAL | Status: AC
  Administered 2021-01-15 (×2): 40 meq via ORAL
  Filled 2021-01-15 (×2): qty 2

## 2021-01-15 NOTE — Telephone Encounter (Signed)
Patient would like to make Paz aware that he got his back surgery.

## 2021-01-15 NOTE — Progress Notes (Signed)
Inpatient Rehabilitation Admissions Coordinator   I met with patient at bedside to inform him that [employer] currently not approving CIR admit. Feel SNF recommended. I have notified acute team and Dr Ostegard the request for a peer to peer  call to [employer] to advocate for CIR approval. Phone [phone] and his reference number for his case is [id number]. I will follow up after peer to peer complete.  Barbara Boyette, RN, MSN Rehab Admissions Coordinator [phone] 01/15/2021 2:16 PM  

## 2021-01-15 NOTE — Telephone Encounter (Signed)
Just an FYI

## 2021-01-15 NOTE — Telephone Encounter (Signed)
Thank you for the FYI, hopefully he will recuperate quickly

## 2021-01-15 NOTE — Progress Notes (Signed)
Physical Therapy Treatment Patient Details Name: [name] MRN: [id number] DOB: 01/12/1966 Today's Date: 01/15/2021   History of Present Illness Pt is a 55 y/o male who presents s/p L1-S1 TLIF on 01/11/2021. PMH significant for DDD, DM, HTN.    PT Comments    Pt received in supine, agreeable to therapy session and with good participation as able. Pt premedicated just prior to session and reports PO pain meds did not adequately control pain during session and with increased incisional drainage this date, RN notified. Pt needing up to modA for sidelying to sit and standing/pivot transfer to chair with RW. Pt performed supine/seated BLE A/AAROM therapeutic exercises and motivated to progress mobility once pain better controlled. May need to coordinate with nursing staff for IV pain meds to be given just prior to session next date to see if this helps (per RN pt had morphine recently prior to session but when chart reviewed, it appear it was given ~1:30 pm). Pt continues to benefit from PT services to progress toward functional mobility goals.   Recommendations for follow up therapy are one component of a multi-disciplinary discharge planning process, led by the attending physician.  Recommendations may be updated based on patient status, additional functional criteria and insurance authorization.  Follow Up Recommendations  CIR     Equipment Recommendations  Rolling walker with 5" wheels (may need wheelchair for longer distances pending progress?)    Recommendations for Other Services Rehab consult     Precautions / Restrictions Precautions Precautions: Fall;Back Precaution Booklet Issued: No Precaution Comments: pt able to recall 3/3 back precautions with increased time but needs reinforcement during log roll Required Braces or Orthoses:  (no brace needed per order) Restrictions Weight Bearing Restrictions: No     Mobility  Bed Mobility Overal bed mobility: Needs Assistance Bed  Mobility: Rolling;Sidelying to Sit Rolling: Min assist Sidelying to sit: HOB elevated;Mod assist       General bed mobility comments: mod A for management of BLE and verbal cues for proper positioning, once pt sidelying HOB elevated as feet moving to floor for pt comfort/ease of transition to upright; pt able to hold himself up with min guard but grimacing and not tolerating upright position well; bed pad noted to be soaked from drainage at incision and RN notified (appears some old/brown drainage and some lighter in color as well), pt will likely need new dressing RN aware.    Transfers Overall transfer level: Needs assistance Equipment used: Rolling walker (2 wheeled) Transfers: Sit to/from [facility] Sit to Stand: Min assist;From elevated surface Stand pivot transfers: Mod assist;From elevated surface       General transfer comment: minA for physical assist to rise, verbal cues for safety and positioning and needing modA for steadying once upright at RW and for pivotal steps toward recliner on R side and RW mgmt.  Ambulation/Gait             General Gait Details: pt defers due to 10/10 pain   Stairs             Wheelchair Mobility    Modified Rankin (Stroke Patients Only)       Balance Overall balance assessment: Needs assistance Sitting-balance support: Feet supported;Bilateral upper extremity supported Sitting balance-Leahy Scale: Fair Sitting balance - Comments: Requires at least 1 UE support on the bed for support due to pain   Standing balance support: Bilateral upper extremity supported;During functional activity Standing balance-Leahy Scale: Poor Standing balance comment: Reliant on  B UE support on the RW                            Cognition Arousal/Alertness: Awake/alert Behavior During Therapy: [facility] for tasks assessed/performed Overall Cognitive Status: Within Functional Limits for tasks assessed                                  General Comments: pt reports pain is worse today despite premedication with morphine and PO pain meds, MD notified      Exercises General Exercises - Lower Extremity Ankle Circles/Pumps: AROM;Both;20 reps;Supine;Seated Quad Sets: AROM;Both;5 reps;Other (comment) (reclined) Gluteal Sets: AROM;5 reps;Seated Long Arc Quad:  (defer, pt too painful seated EOB) Heel Slides: AAROM;Both;10 reps;Supine Hip ABduction/ADduction: AAROM;Both;10 reps;Supine    General Comments General comments (skin integrity, edema, etc.): HR 110 bpm to 125 bpm with exertion; BP 139/80 (98) supine and BP 160/88 (107) post-exertion      Pertinent Vitals/Pain Pain Assessment: 0-10 Pain Score: 10-Worst pain ever Faces Pain Scale: Hurts worst Pain Location: low back pain worse on R side with sit<>stand, static sitting and stand pivot to chair Pain Descriptors / Indicators: Operative site guarding;Aching;Grimacing;Moaning;Radiating;Tingling Pain Intervention(s): Limited activity within patient's tolerance;Monitored during session;Premedicated before session;Repositioned;Patient requesting pain meds-RN notified    Home Living                      Prior Function            PT Goals (current goals can now be found in the care plan section) Acute Rehab PT Goals Patient Stated Goal: better pain control PT Goal Formulation: With patient Time For Goal Achievement: 01/26/21 Progress towards PT goals: Progressing toward goals (pain limiting, slow progress today)    Frequency    Min 5X/week      PT Plan Current plan remains appropriate    Co-evaluation              AM-PAC PT "6 Clicks" Mobility   Outcome Measure  Help needed turning from your back to your side while in a flat bed without using bedrails?: A Lot Help needed moving from lying on your back to sitting on the side of a flat bed without using bedrails?: A Lot Help needed moving to and from a bed to a chair  (including a wheelchair)?: A Lot Help needed standing up from a chair using your arms (e.g., wheelchair or bedside chair)?: A Lot Help needed to walk in hospital room?: Total Help needed climbing 3-5 steps with a railing? : Total 6 Click Score: 10    End of Session Equipment Utilized During Treatment: Gait belt Activity Tolerance: Patient limited by pain;Other (comment) (reporting 10/10 pain (8/10 previous date per pt)) Patient left: in chair;with call bell/phone within reach;with chair alarm set;with family/visitor present (pt spouse present) Nurse Communication: Mobility status;Other (comment) (increased drainage at dressing, needs a new dressing) PT Visit Diagnosis: Unsteadiness on feet (R26.81);Pain Pain - Right/Left: Right Pain - part of body:  (low back/radiating down leg)     Time: 7544-9201 PT Time Calculation (min) (ACUTE ONLY): 28 min  Charges:  $Therapeutic Exercise: 8-22 mins $Therapeutic Activity: 8-22 mins                     Shalaunda Weatherholtz P., PTA Acute Rehabilitation Services Pager: [phone] Office: [place]  01/15/2021, 6:32 PM

## 2021-01-15 NOTE — Progress Notes (Signed)
Neurosurgery Service Progress Note  Subjective: No acute events overnight, some R paraspinal / buttock spasms, back pain is actually fairly tolerable, radicular pain still gone from preop, some incisional oozing yesterday that resolved  Objective: Vitals:   01/14/21 1955 01/14/21 2355 01/15/21 0355 01/15/21 0734  BP: 114/68 110/72 121/66   Pulse: 97 96 95   Resp: 13 16 (!) 4 15  Temp: 99.7 F (37.6 C)     TempSrc: Oral     SpO2: 91% 95% 95%   Weight:      Height:        Physical Exam: Strength 5/5 x4, SILTx4 Incision c/d/i  Assessment & Plan: 55 y.o. man s/p 5 level open TLIF, recovering well.  -bedside nurse can apply dressings prn for any incisional oozing -activity as tolerated, no brace needed -f/u AM labs, Hb small downtrend but appropriate with changes in volume status, RFP still pending -medicine recs -PT/OT rec CIR, CIR dispo pending -SCDs/TEDs/SQH  [name]  01/15/21 8:12 AM

## 2021-01-15 NOTE — Progress Notes (Signed)
Pts incision started bleeding heavily  RN assisted with pressure dressing. Pressure applied and bleeding subsided. MD notified

## 2021-01-15 NOTE — Progress Notes (Signed)
NAME:  [name], MRN:  [id number], DOB:  June 06, 1965, LOS: 4 ADMISSION DATE:  01/11/2021, CONSULTATION DATE:  10/2 REFERRING MD:  Marcello Moores , Reason for Consult:  Hypotension   History of Present Illness:  [name], is a 55 y.o. male, who presented to [facility] on 9/30 for planned surgery with Dr. Zada Finders. He had complaints low low back and LE radicular pain secondary to severe canal stenosis and severe diffuse spondylosis. He underwent a L1-S1 decompression and instrumented fusion on 9/30 with a reported 2066ml of blood loss, with 869ml returned by cell saver.  They have a pertinent past medical history of DM2, DDD, HLD, HTN, obesity, OSA on CPAP Patient became hypotensive, PCCM was asked to help with medical management  Significant Hospital Events: Including procedures, antibiotic start and stop dates in addition to other pertinent events   9/30 L1-S1 decompression and instrumented fusion 10/2 ccm consulted for soft BPS. Gave fluids, remained on med surg  10/3 stable Bps   Interim History / Subjective:  No overnight issues H/H remained stable AKI has improved Objective   Blood pressure (!) 147/94, pulse 100, temperature 98.6 F (37 C), temperature source Oral, resp. rate 16, height 6\' 2"  (1.88 m), weight (!) 142 kg, SpO2 99 %.        Intake/Output Summary (Last 24 hours) at 01/15/2021 1521 Last data filed at 01/15/2021 1100 Gross per 24 hour  Intake 4186.09 ml  Output 6100 ml  Net -1913.91 ml   Filed Weights   01/11/21 0554  Weight: (!) 142 kg    Examination: General: Middle-age morbidly obese Caucasian male, lying in the bed HEENT: NCAT, moist mucous membranes Neuro: AAOx4 following commands no focal deficit  CV: tachycardic. Cap refill brisk, no murmur appreciated PULM:  CTAb on RA symmetrical chest expansion GI: soft round ndnt  Extremities: no acute deformity.  Skin: No rash   Resolved Hospital Problem list   Hypotension   Assessment & Plan:  Acute blood loss  anemia during surgery  Transient hypotension, improved Hyponatremia Acute kidney injury on CKD stage III a Diabetes type 2 with hyperglycemia Obstructive sleep apnea on CPAP at night Chronic low back pain s/p L5/S1 decompression and instrumentation Hypomagnesemia/hypokalemia  Patient H&H remained stable Initially dropped due to acute blood loss anemia from surgery and dilutional His blood pressure remained stable Serum sodium is improving now to 131, up from 128 Serum creatinine trended down 0.9 Discontinue IV fluid CBG remain elevated to over 200 Continue sliding scale insulin Added Levemir 10 units once daily Pain management per primary team Continue to monitor electrolytes Continue CPAP at night  PCCM will sign off, please call with questions  Best Practice (right click and "Reselect all SmartList Selections" daily)   Diet/type: dysphagia diet (see orders) DVT prophylaxis: SCD GI prophylaxis: N/A Lines: N/A Foley:  Yes, and it is still needed Code Status:  full code Last date of multidisciplinary goals of care discussion [per primary]  Labs   CBC: Recent Labs  Lab 01/11/21 1910 01/12/21 0122 01/13/21 0749 01/13/21 2359 01/14/21 0618 01/14/21 1412 01/14/21 2150 01/15/21 0602 01/15/21 1328  WBC 24.0* 17.7* 9.7 8.9  --   --   --   --   --   HGB 13.7 12.7* 9.8* 7.9* 8.3* 8.7* 8.3* 7.8* 8.7*  HCT 39.7 36.5* 28.5* 23.6* 23.9* 26.3* 24.4* 23.4* 25.2*  MCV 89.8 89.7 92.2 93.3  --   --   --   --   --  PLT 296 238 154 145*  --   --   --   --   --     Basic Metabolic Panel: Recent Labs  Lab 01/12/21 0122 01/13/21 1940 01/14/21 0006 01/15/21 0602  NA 131* 130* 128* 131*  K 4.4 4.2 3.8 3.4*  CL 100 101 101 101  CO2 21* 18* 19* 24  GLUCOSE 284* 200* 168* 224*  BUN 20 30* 29* 13  CREATININE 1.21 2.09* 1.88* 0.98  CALCIUM 7.5* 7.2* 6.9* 7.7*  MG  --  1.5* 1.5*  --   PHOS 3.1 2.9  --   --    GFR: Estimated Creatinine Clearance: 124.8 mL/min (by C-G formula  based on SCr of 0.98 mg/dL). Recent Labs  Lab 01/11/21 1910 01/12/21 0122 01/13/21 0749 01/13/21 1940 01/13/21 2359  WBC 24.0* 17.7* 9.7  --  8.9  LATICACIDVEN  --   --   --  2.3*  --     Liver Function Tests: Recent Labs  Lab 01/12/21 0122  ALBUMIN 3.3*   No results for input(s): LIPASE, AMYLASE in the last 168 hours. No results for input(s): AMMONIA in the last 168 hours.  ABG    Component Value Date/Time   TCO2 23 11/13/2017 1923     Coagulation Profile: No results for input(s): INR, PROTIME in the last 168 hours.  Cardiac Enzymes: Recent Labs  Lab 01/13/21 1940 01/14/21 0006 01/15/21 0602  CKTOTAL 2,972* 2,799* 1,970*  CKMB 2.6  --   --     HbA1C: Hgb A1c MFr Bld  Date/Time Value Ref Range Status  01/08/2021 09:00 AM 6.7 (H) 4.8 - 5.6 % Final    Comment:    (NOTE)         Prediabetes: 5.7 - 6.4         Diabetes: >6.4         Glycemic control for adults with diabetes: <7.0   09/14/2020 08:55 AM 7.1 (H) 4.6 - 6.5 % Final    Comment:    Glycemic Control Guidelines for People with Diabetes:Non Diabetic:  <6%Goal of Therapy: <7%Additional Action Suggested:  >8%     CBG: Recent Labs  Lab 01/14/21 1102 01/14/21 1706 01/14/21 2117 01/15/21 0627 01/15/21 1219  GLUCAP 245* 274* 268* 257* 256*    Jacky Kindle MD [facility] See Amion for pager If no response to pager, please call [phone] until 7pm After 7pm, Please call E-link [phone]  01/15/2021, 3:21 PM

## 2021-01-16 LAB — GLUCOSE, CAPILLARY
Glucose-Capillary: 198 mg/dL — ABNORMAL HIGH (ref 70–99)
Glucose-Capillary: 276 mg/dL — ABNORMAL HIGH (ref 70–99)
Glucose-Capillary: 275 mg/dL — ABNORMAL HIGH (ref 70–99)
Glucose-Capillary: 322 mg/dL — ABNORMAL HIGH (ref 70–99)

## 2021-01-16 LAB — HEMOGLOBIN AND HEMATOCRIT, BLOOD
HCT: 25.6 % — ABNORMAL LOW (ref 39.0–52.0)
HCT: 27.7 % — ABNORMAL LOW (ref 39.0–52.0)
HCT: 25.7 % — ABNORMAL LOW (ref 39.0–52.0)
Hemoglobin: 8.9 g/dL — ABNORMAL LOW (ref 13.0–17.0)
Hemoglobin: 9.4 g/dL — ABNORMAL LOW (ref 13.0–17.0)
Hemoglobin: 8.6 g/dL — ABNORMAL LOW (ref 13.0–17.0)

## 2021-01-16 NOTE — Progress Notes (Signed)
Physical Therapy Treatment Patient Details Name: [name] MRN: [id number] DOB: [birth year] Today's Date: 01/16/2021   History of Present Illness Pt is a 55 y/o male who presents s/p L1-S1 TLIF on 01/11/2021. PMH significant for DDD, DM, HTN.    PT Comments    Pt received in supine, agreeable to therapy session with good participation and improved tolerance for seated/standing tasks this date. Pt able to progress to transfer training from the chair and short gait task at bedside using bari RW and minA. Pt needing up to modA for log roll transfer to EOB and to stand from recliner and elevated bed heights. Emphasis on back precautions, proper donning/doffing of lumbar corset for comfort, safety with transfers and assistive device use and activity pacing. Pt agreeable to sit up in chair at least one hour at end of session.  Recommendations for follow up therapy are one component of a multi-disciplinary discharge planning process, led by the attending physician.  Recommendations may be updated based on patient status, additional functional criteria and insurance authorization.  Follow Up Recommendations  CIR     Equipment Recommendations  Rolling walker with 5" wheels (may need wheelchair for longer distances pending progress?)    Recommendations for Other Services Rehab consult     Precautions / Restrictions Precautions Precautions: Fall;Back Precaution Booklet Issued: No Precaution Comments: pt able to recall 3/3 back precautions with increased time but needs reinforcement during log roll Required Braces or Orthoses: Spinal Brace Spinal Brace: Lumbar corset;Applied in sitting position Other Brace: for comfort, donned seated Restrictions Weight Bearing Restrictions: No     Mobility  Bed Mobility Overal bed mobility: Needs Assistance Bed Mobility: Rolling;Sidelying to Sit Rolling: Min assist Sidelying to sit: Mod assist       General bed mobility comments: mod A for  management of BLE and verbal cues for proper positioning, log roll to L EOB; pt with no new drainage observed at incision but small blister notable above honeycomb dressing site which was not present previous session    Transfers Overall transfer level: Needs assistance Equipment used: Rolling walker (2 wheeled) Transfers: Sit to/from [facility] Sit to Stand: From elevated surface;Mod assist         General transfer comment: pt needs visual/verbal cues for safe technique/using momentum strategy to rise and needing modA for steadying once upright at RW  Ambulation/Gait Ambulation/Gait assistance: Min assist Gait Distance (Feet): 8 Feet Assistive device: Rolling walker (2 wheeled) Gait Pattern/deviations: Step-through pattern;Decreased stride length;Trunk flexed;Wide base of support Gait velocity: dec   General Gait Details: heavy BUE reliance on RW, small low steps, pt grimacing and c/o increaed radiating RLE pain with weight bearing; HR to 120's bpm with exertion   Stairs             Wheelchair Mobility    Modified Rankin (Stroke Patients Only)       Balance Overall balance assessment: Needs assistance Sitting-balance support: Feet supported;Bilateral upper extremity supported Sitting balance-Leahy Scale: Fair Sitting balance - Comments: Requires at least 1 UE support on the bed for support due to pain   Standing balance support: Bilateral upper extremity supported;During functional activity Standing balance-Leahy Scale: Poor Standing balance comment: Reliant on B UE support on the RW                            Cognition Arousal/Alertness: Awake/alert Behavior During Therapy: [facility] for tasks assessed/performed Overall Cognitive Status: Within Functional  Limits for tasks assessed                                 General Comments: pt reports he is feeling less pain today and seems less anxious prior to mobility; cooperative  as able but self-limiting distances due to pain      Exercises General Exercises - Lower Extremity Ankle Circles/Pumps: AROM;Both;20 reps;Supine;Seated Quad Sets: AROM;Both;5 reps;Other (comment) (reclined) Gluteal Sets: AROM;5 reps;Seated Long Arc Quad: AROM;Both;10 reps;Seated Other Exercises Other Exercises: STS x 2 from EOB and x1 from chair for BLE strengthening    General Comments General comments (skin integrity, edema, etc.): BP elevated 177/91 seated in chair after exertion (typed into flowsheet for medical team); HR 110-126 again with seated/standing tasks      Pertinent Vitals/Pain Pain Assessment: 0-10 Pain Score: 5  Faces Pain Scale: Hurts whole lot Pain Location: low back pain worse on R side with sit<>stand, static sitting and stepping Pain Descriptors / Indicators: Operative site guarding;Aching;Grimacing;Radiating;Tingling Pain Intervention(s): Limited activity within patient's tolerance;Monitored during session;Premedicated before session;Repositioned    Home Living                      Prior Function            PT Goals (current goals can now be found in the care plan section) Acute Rehab PT Goals Patient Stated Goal: better pain control PT Goal Formulation: With patient Time For Goal Achievement: 01/26/21 Progress towards PT goals: Progressing toward goals    Frequency    Min 5X/week      PT Plan Current plan remains appropriate    Co-evaluation              AM-PAC PT "6 Clicks" Mobility   Outcome Measure  Help needed turning from your back to your side while in a flat bed without using bedrails?: A Lot Help needed moving from lying on your back to sitting on the side of a flat bed without using bedrails?: A Lot Help needed moving to and from a bed to a chair (including a wheelchair)?: A Lot Help needed standing up from a chair using your arms (e.g., wheelchair or bedside chair)?: A Lot Help needed to walk in hospital room?: A  Lot Help needed climbing 3-5 steps with a railing? : Total 6 Click Score: 11    End of Session Equipment Utilized During Treatment: Gait belt;Back brace Activity Tolerance: Patient tolerated treatment well;Patient limited by pain Patient left: in chair;with call bell/phone within reach;with chair alarm set;with family/visitor present (pt spouse present) Nurse Communication: Mobility status;Other (comment);Need for lift equipment (small blister above honeycomb dressing, some swelling around that site as well; no new drainage; use +2 with bari RW or Stedy for return to bed) PT Visit Diagnosis: Unsteadiness on feet (R26.81);Pain Pain - Right/Left: Right Pain - part of body:  (low back/radiating down leg)     Time: 4742-5956 PT Time Calculation (min) (ACUTE ONLY): 36 min  Charges:  $Gait Training: 8-22 mins $Therapeutic Exercise: 8-22 mins                     Laterria Lasota P., PTA Acute Rehabilitation Services Pager: [phone] Office: [place] 01/16/2021, 5:13 PM

## 2021-01-16 NOTE — Progress Notes (Signed)
Neurosurgery Service Progress Note  Subjective: No acute events overnight, no new complaints this morning  Objective: Vitals:   01/15/21 1155 01/15/21 2000 01/15/21 2355 01/16/21 0400  BP: (!) 147/94 128/90 (!) 156/90 (!) 140/92  Pulse: 100 (!) 103 (!) 109 98  Resp: 16 14 18 16   Temp: 98.6 F (37 C) 98.4 F (36.9 C) 99 F (37.2 C) 98.7 F (37.1 C)  TempSrc: Oral Oral Oral Oral  SpO2: 99% 91% 100% 99%  Weight:      Height:        Physical Exam: Strength 5/5 x4, SILTx4 Incision c/d/i  Assessment & Plan: 55 y.o. man s/p 5 level open TLIF, recovering well.  -bedside nurse can apply dressings prn for any incisional oozing -activity as tolerated, will order a brace today -medicine recs -PT/OT rec CIR, CIR dispo pending insurance approval, peer to peer performed this morning and approved -SCDs/TEDs/SQH  [name]  01/16/21 8:14 AM

## 2021-01-16 NOTE — Plan of Care (Signed)

## 2021-01-16 NOTE — Progress Notes (Signed)
PT and pt family reported blister on pt back, midline, above honeycomb dressing. 2.5 cm x 1 cm. RN dressed with nonstick pad covered by Mepilex to secure. Will contine to monitor.

## 2021-01-16 NOTE — Progress Notes (Signed)
RT note. Patient cpap all set up for the night. Patient stated he will place self on cpap when ready for bed. RT will continue to monitor.

## 2021-01-16 NOTE — Progress Notes (Signed)
Orthopedic Tech Progress Note Patient Details:  [name] [birth year] [id number] Dropped off LSO brace in patient's room.  Ortho Devices Type of Ortho Device: Lumbar corsett Ortho Device/Splint Location: Back Ortho Device/Splint Interventions: Ordered      Henrine Hayter E Rielly Corlett 01/16/2021, 10:37 AM

## 2021-01-16 NOTE — Progress Notes (Signed)
Inpatient Diabetes Program Recommendations  AACE/ADA: New Consensus Statement on Inpatient Glycemic Control (2015)  Target Ranges:  Prepandial:   less than 140 mg/dL      Peak postprandial:   less than 180 mg/dL (1-2 hours)      Critically ill patients:  140 - 180 mg/dL   Lab Results  Component Value Date   GLUCAP 198 (H) 01/16/2021   HGBA1C 6.7 (H) 01/08/2021    Review of Glycemic Control Results for [name], [name] (MRN [id number]) as of 01/16/2021 09:50  Ref. Range 01/15/2021 12:19 01/15/2021 16:18 01/15/2021 21:21 01/16/2021 06:34  Glucose-Capillary Latest Ref Range: 70 - 99 mg/dL 256 (H) 177 (H) 328 (H) 198 (H)   Diabetes history: Type 2 Dm Outpatient Diabetes medications: Metformin 1000 mg BID Current orders for Inpatient glycemic control: Levemir 10 units QD, Novolog 0-15 units TID & HS  Inpatient Diabetes Program Recommendations:    Consider changing diet to carb modified and increasing Levemir to 18 units QD.   Thanks, Bronson Curb, MSN, RNC-OB Diabetes Coordinator [phone] (8a-5p)    Thanks, Bronson Curb, MSN, RNC-OB Diabetes Coordinator [phone] (8a-5p)

## 2021-01-16 NOTE — Progress Notes (Signed)
Inpatient Rehabilitation Admissions Coordinator   Dr Joaquim Nam obtained insurance approval for CIR . I await bed availability Patient made aware.  Danne Baxter, RN, MSN Rehab Admissions Coordinator [phone] 01/16/2021 1:35 PM

## 2021-01-17 LAB — GLUCOSE, CAPILLARY
Glucose-Capillary: 193 mg/dL — ABNORMAL HIGH (ref 70–99)
Glucose-Capillary: 326 mg/dL — ABNORMAL HIGH (ref 70–99)
Glucose-Capillary: 186 mg/dL — ABNORMAL HIGH (ref 70–99)
Glucose-Capillary: 266 mg/dL — ABNORMAL HIGH (ref 70–99)

## 2021-01-17 LAB — HEMOGLOBIN AND HEMATOCRIT, BLOOD
HCT: 26.3 % — ABNORMAL LOW (ref 39.0–52.0)
HCT: 26.6 % — ABNORMAL LOW (ref 39.0–52.0)
HCT: 24.6 % — ABNORMAL LOW (ref 39.0–52.0)
Hemoglobin: 8.9 g/dL — ABNORMAL LOW (ref 13.0–17.0)
Hemoglobin: 9.2 g/dL — ABNORMAL LOW (ref 13.0–17.0)
Hemoglobin: 8.5 g/dL — ABNORMAL LOW (ref 13.0–17.0)

## 2021-01-17 MED ORDER — POLYETHYLENE GLYCOL 3350 17 G PO PACK
17.0000 g | PACK | Freq: Every day | ORAL | Status: DC
  Administered 2021-01-17 – 2021-01-18 (×2): 17 g via ORAL
  Filled 2021-01-17: qty 1

## 2021-01-17 NOTE — Progress Notes (Signed)
Physical Therapy Treatment Patient Details Name: [name] MRN: [id number] DOB: 05-08-1965 Today's Date: 01/17/2021   History of Present Illness Pt is a 55 y/o male who presents s/p L1-S1 TLIF on 01/11/2021. PMH significant for DDD, DM, HTN.    PT Comments    Pt received in recliner, eager to get back to bed from chair and with fair tolerance for functional mobility tasks this date. Per RN, pt not due for pain meds for another 30-45 mins at time of session, pt agreeable to participate despite increased pain and able to perform transfers and stepping at bedside with +2 min to modA. Of note, pt worked with OT prior to PT session which may account for increased pain. Pt needs heavy cues for safe technique with transfers and able to recall back precautions. Pt continues to benefit from PT services to progress toward functional mobility goals.   Recommendations for follow up therapy are one component of a multi-disciplinary discharge planning process, led by the attending physician.  Recommendations may be updated based on patient status, additional functional criteria and insurance authorization.  Follow Up Recommendations  CIR     Equipment Recommendations  Rolling walker with 5" wheels    Recommendations for Other Services Rehab consult     Precautions / Restrictions Precautions Precautions: Fall;Back Precaution Comments: pt able to recall 3/3 back precautions with increased time but needs reinforcement during log roll Required Braces or Orthoses: Spinal Brace Spinal Brace: Lumbar corset;Applied in sitting position Other Brace: for comfort, donned seated Restrictions Weight Bearing Restrictions: No     Mobility  Bed Mobility Overal bed mobility: Needs Assistance Bed Mobility: Sit to Sidelying;Rolling Rolling: Min assist       Sit to sidelying: Mod assist;+2 for safety/equipment General bed mobility comments: mod A for management of BLE and verbal cues for proper  positioning, second person present for safety but not needing to physically assist at shoulders; pt needs heavy +2 assist with bed flat and transfer pad for posterior supine scooting toward HOB, attempting to assist with BLE    Transfers Overall transfer level: Needs assistance Equipment used: Rolling walker (2 wheeled) Transfers: Sit to/from [facility] Sit to Stand: Mod assist;+2 physical assistance Stand pivot transfers: Min assist;+2 physical assistance       General transfer comment: pt needs visual/verbal cues for safe technique/using momentum strategy to rise and needing two attempts to rise from chair; pt attempting to pull up on RW initially despite cues to push from chair  Ambulation/Gait Ambulation/Gait assistance: Min assist;+2 physical assistance;+2 safety/equipment Gait Distance (Feet): 6 Feet Assistive device: Rolling walker (2 wheeled) Gait Pattern/deviations: Decreased stride length;Trunk flexed;Wide base of support;Step-to pattern Gait velocity: dec   General Gait Details: heavy BUE reliance on RW, small low steps, pt grimacing and c/o R hip pain with weight bearing; HR to 120's bpm with exertion; self-limiting distance due to lack of premedication (not due for another 30 mins)       Balance Overall balance assessment: Needs assistance Sitting-balance support: Feet supported;Bilateral upper extremity supported Sitting balance-Leahy Scale: Fair     Standing balance support: Bilateral upper extremity supported;During functional activity Standing balance-Leahy Scale: Poor Standing balance comment: Reliant on B UE support on the RW              Cognition Arousal/Alertness: Awake/alert Behavior During Therapy: WFL for tasks assessed/performed Overall Cognitive Status: Within Functional Limits for tasks assessed           General Comments:  cooperative, mildly anxious PR:XYVO prior to mobility and needs mod cues for safe technique with  transfers due to internal distraction      Exercises      General Comments General comments (skin integrity, edema, etc.): some drainage on upper portion of surgical dressing, RN aware and planning to change honeycomb dressing after session; HR tachy to 120's bpm with exertion; VSS otherwise on RA      Pertinent Vitals/Pain Pain Assessment: Faces Faces Pain Scale: Hurts whole lot Pain Location: continues: low back pain worse on R side (kidney to hip) Pain Descriptors / Indicators: Operative site guarding;Aching;Grimacing;Radiating Pain Intervention(s): Limited activity within patient's tolerance;Monitored during session;Repositioned;Patient requesting pain meds-RN notified;Ice applied     PT Goals (current goals can now be found in the care plan section) Acute Rehab PT Goals Patient Stated Goal: move better with less pain PT Goal Formulation: With patient Time For Goal Achievement: 01/26/21 Progress towards PT goals: Progressing toward goals (slow progress due to pain)    Frequency    Min 5X/week      PT Plan Current plan remains appropriate    Co-evaluation              AM-PAC PT "6 Clicks" Mobility   Outcome Measure  Help needed turning from your back to your side while in a flat bed without using bedrails?: A Little Help needed moving from lying on your back to sitting on the side of a flat bed without using bedrails?: A Lot Help needed moving to and from a bed to a chair (including a wheelchair)?: A Lot Help needed standing up from a chair using your arms (e.g., wheelchair or bedside chair)?: A Lot Help needed to walk in hospital room?: A Lot Help needed climbing 3-5 steps with a railing? : Total 6 Click Score: 12    End of Session Equipment Utilized During Treatment: Gait belt;Back brace Activity Tolerance: Patient limited by pain Patient left: in bed;with call bell/phone within reach;with bed alarm set (ice pack to R side of lower back) Nurse  Communication: Mobility status;Patient requests pain meds PT Visit Diagnosis: Unsteadiness on feet (R26.81);Pain Pain - Right/Left: Right Pain - part of body:  (hip/low back)     Time: 5929-2446 PT Time Calculation (min) (ACUTE ONLY): 19 min  Charges:  $Therapeutic Activity: 8-22 mins                     Carleigh Buccieri P., PTA Acute Rehabilitation Services Pager: [phone] Office: [place] 01/17/2021, 2:42 PM

## 2021-01-17 NOTE — Progress Notes (Signed)
Neurosurgery Service Progress Note  Subjective: No acute events overnight, no new complaints this morning, continued right buttock pain  Objective: Vitals:   01/16/21 2335 01/17/21 0422 01/17/21 0515 01/17/21 0800  BP: (!) 149/79   (!) 144/92  Pulse: (!) 105 (!) 105 (!) 108 (!) 105  Resp: 16   13  Temp:    99 F (37.2 C)  TempSrc:    Oral  SpO2: 98% 99% 99% 99%  Weight:      Height:        Physical Exam: Strength 5/5 x4, SILTx4 Incision c/d/i  Assessment & Plan: 55 y.o. man s/p 5 level open TLIF, recovering well.  -bedside nurse can apply dressings prn for any incisional oozing -wear brace when out of bed -will schedule miralax to help with BM -medicine recs -PT/OT rec CIR, CIR dispo pending bed availability -SCDs/TEDs/SQH  Joyice Faster Stephanine Reas  01/17/21 8:40 AM

## 2021-01-17 NOTE — Progress Notes (Signed)
RT note. Patient able to place cpap on self tonight, RT will continue to monitor.

## 2021-01-17 NOTE — PMR Pre-admission (Signed)
PMR Admission Coordinator Pre-Admission Assessment  Patient: [name] is an 55 y.o., male MRN: [id number] DOB: 20-Apr-1965 Height: '6\' 2"'  (188 cm) Weight: (!) 142 kg  Insurance Information HMO:     PPO:      PCP:      IPA:      80/20:      OTHER:  PRIMARY: [employer] Medicare      Policy#: [id number]      Subscriber: pt CM Name: Jackelyn Poling      Phone#: [phone]     Fax#: [phone] Pre-Cert#: [id number] approved for 7 days after peer to peer appeal with f/u with Enis Slipper at phone [phone] ext [phone] fax [phone]   Employer:  Benefits:  Phone #: [phone]     Name: 01/17/21 Eff. Date: 04/14/2020     Deduct: none      Out of Pocket Max: $5000      Life Max: none CIR: $250 co pay per admit then insurance covers 80%       SNF: $250 co pay per admit thern 80%; 100 days Outpatient: 80%     Co-Pay: 100 combined visits Home Health: first 15 visits no charge then visits 16 until 100 80%.       Co-Pay: 100 combined visits DME: 80%     Co-Pay: 20% Providers: in network  SECONDARY: none      Financial Counselor:       Phone#:   [employer]" for patients in Inpatient Rehabilitation Facilities with attached "Privacy Act [place] Records" was provided and verbally reviewed with: N/A  Emergency Contact Information Contact Information     Name Relation Home Work [place] Spouse [phone]  [phone]   Jarae, Panas   [phone]       Current Medical History  Patient Admitting Diagnosis: Lumbar stenosis and radiculopathy  History of Present Illness: 55 year old male with medical history of DM2, DDD, HLD, HTN, obesity and OSA on CPAP, presented on 01/11/2021 for planned surgery . Has had complaints of low back pain and LE radicular pain secondary to severe canal stenosis and severe diffuse spondylosis. He underwent a L1-S1 decompression and instrumental fusion on 01/11/21.   Postoperative with hypotension issues.  PCCM consulted after rapid response was called on 10/2. Patient given IVF bolus and foley cath was placed. Noticeable Hgb drop from 12.7 to 9.8.Holding home antihypertensives and began IVF maintenance per PCCM recommendation.s Monitors Hgb trends. AKI with Crest elevation. Avoid nephrotoxins meds. Continue SSI and stop metformin. Postoperative incisional oozing monitoring with dressing changes.  Patient's medical record from [facility]  has been reviewed by the rehabilitation admission coordinator and physician.  Past Medical History  Past Medical History:  Diagnosis Date   DDD (degenerative disc disease), lumbar    Diabetes mellitus    Hyperlipemia    Hypertension    Obesity    OSA (obstructive sleep apnea)    on CPAP   Has the patient had major surgery during 100 days prior to admission? Yes  Family History   family history includes Diabetes in his mother.  Current Medications  Current Facility-Administered Medications:    0.9 %  sodium chloride infusion, 250 mL, Intravenous, Continuous, Ostergard, Joyice Faster, MD   acetaminophen (TYLENOL) tablet 650 mg, 650 mg, Oral, Q4H PRN, 650 mg at 01/13/21 1230 **OR** acetaminophen (TYLENOL) suppository 650 mg, 650 mg, Rectal, Q4H PRN, Judith Part, MD   Chlorhexidine Gluconate Cloth 2 %  PADS 6 each, 6 each, Topical, Daily, Judith Part, MD, 6 each at 01/17/21 1100   docusate sodium (COLACE) capsule 100 mg, 100 mg, Oral, BID, Judith Part, MD, 100 mg at 01/18/21 0913   fluticasone (FLONASE) 50 MCG/ACT nasal spray 2 spray, 2 spray, Each Nare, Daily, Judith Part, MD, 2 spray at 01/18/21 6283   gabapentin (NEURONTIN) tablet 600 mg, 600 mg, Oral, TID, Judith Part, MD, 600 mg at 01/18/21 0913   heparin injection 5,000 Units, 5,000 Units, Subcutaneous, Q8H, Ostergard, Joyice Faster, MD, 5,000 Units at 01/18/21 0554   insulin aspart (novoLOG) injection 0-15 Units, 0-15 Units, Subcutaneous, TID AC & HS, Estill Cotta, NP, 5 Units at 01/18/21 0645   insulin detemir (LEVEMIR) injection 10 Units, 10 Units, Subcutaneous, Daily, Jacky Kindle, MD, 10 Units at 01/18/21 6629   menthol-cetylpyridinium (CEPACOL) lozenge 3 mg, 1 lozenge, Oral, PRN **OR** phenol (CHLORASEPTIC) mouth spray 1 spray, 1 spray, Mouth/Throat, PRN, Ostergard, Joyice Faster, MD   methocarbamol (ROBAXIN) tablet 500 mg, 500 mg, Oral, Q6H PRN, Judith Part, MD, 500 mg at 01/18/21 0432   morphine 4 MG/ML injection 4 mg, 4 mg, Intravenous, Q3H PRN, Judith Part, MD, 4 mg at 01/17/21 1224   ondansetron (ZOFRAN) tablet 4 mg, 4 mg, Oral, Q6H PRN **OR** ondansetron (ZOFRAN) injection 4 mg, 4 mg, Intravenous, Q6H PRN, Judith Part, MD, 4 mg at 01/13/21 1845   oxyCODONE (Oxy IR/ROXICODONE) immediate release tablet 10 mg, 10 mg, Oral, Q4H PRN, Judith Part, MD, 10 mg at 01/18/21 0930   oxyCODONE (Oxy IR/ROXICODONE) immediate release tablet 5 mg, 5 mg, Oral, Q4H PRN, Judith Part, MD, 5 mg at 01/14/21 2028   polyethylene glycol (MIRALAX / GLYCOLAX) packet 17 g, 17 g, Oral, Daily, Ostergard, Joyice Faster, MD, 17 g at 01/18/21 0914   simethicone (MYLICON) chewable tablet 80 mg, 80 mg, Oral, QID PRN, Viona Gilmore D, NP, 80 mg at 01/15/21 1340   sodium chloride flush (NS) 0.9 % injection 3 mL, 3 mL, Intravenous, Q12H, Ostergard, Thomas A, MD, 3 mL at 01/18/21 1026   sodium chloride flush (NS) 0.9 % injection 3 mL, 3 mL, Intravenous, PRN, Judith Part, MD   tamsulosin (FLOMAX) capsule 0.4 mg, 0.4 mg, Oral, Daily, Bergman, Meghan D, NP, 0.4 mg at 01/18/21 0913   tiZANidine (ZANAFLEX) tablet 4 mg, 4 mg, Oral, Q6H PRN, Judith Part, MD, 4 mg at 01/18/21 0930  Patients Current Diet:  Diet Order             Diet - low sodium heart healthy           Diet regular Room service appropriate? Yes; Fluid consistency: Thin  Diet effective now                  Precautions / Restrictions Precautions Precautions:  Fall, Back Precaution Booklet Issued: No Precaution Comments: pt able to recall 3/3 back precautions with increased time but needs reinforcement during log roll Spinal Brace: Lumbar corset, Applied in sitting position Other Brace: for comfort, donned seated Restrictions Weight Bearing Restrictions: No   Has the patient had 2 or more falls or a fall with injury in the past year? No  Prior Activity Level Community (5-7x/wk): Indpendent; not worked since May  Prior Functional Level Self Care: Did the patient need help bathing, dressing, using the toilet or eating? Independent  Indoor Mobility: Did the patient need assistance with walking from room to  room (with or without device)? Independent  Stairs: Did the patient need assistance with internal or external stairs (with or without device)? Independent  Functional Cognition: Did the patient need help planning regular tasks such as shopping or remembering to take medications? Independent  Patient Information Are you of Hispanic, Latino/a,or Spanish origin?: A. No, not of Hispanic, Latino/a, or Spanish origin What is your race?: A. White Do you need or want an interpreter to communicate with a doctor or health care staff?: 0. No  Patient's Response To:  Health Literacy and Transportation Is the patient able to respond to health literacy and transportation needs?: Yes Health Literacy - How often do you need to have someone help you when you read instructions, pamphlets, or other written material from your doctor or pharmacy?: Never In the past 12 months, has lack of transportation kept you from medical appointments or from getting medications?: No In the past 12 months, has lack of transportation kept you from meetings, work, or from getting things needed for daily living?: No  Home Assistive Devices / [place] Devices/Equipment: Eyeglasses, CBG Meter, Blood pressure cuff Home Equipment: None  Prior Device Use:  Indicate devices/aids used by the patient prior to current illness, exacerbation or injury? None of the above  Current Functional Level Cognition  Overall Cognitive Status: Within Functional Limits for tasks assessed Orientation Level: Oriented X4, Oriented to person, Oriented to place, Oriented to situation, Oriented to time General Comments: cooperative, mildly anxious XB:DZHG prior to mobility and needs mod cues for safe technique with transfers due to internal distraction    Extremity Assessment (includes Sensation/Coordination)  Upper Extremity Assessment: Overall WFL for tasks assessed  Lower Extremity Assessment: Defer to PT evaluation    ADLs  Overall ADL's : Needs assistance/impaired Eating/Feeding: Independent, Sitting Grooming: Set up, Sitting Upper Body Bathing: Min guard, Minimal assistance, Sitting Lower Body Bathing: Maximal assistance, +2 for physical assistance, +2 for safety/equipment, Sitting/lateral leans, Sit to/from stand Upper Body Dressing : Min guard, Sitting Lower Body Dressing: Maximal assistance, +2 for physical assistance, +2 for safety/equipment, Sitting/lateral leans, Sit to/from stand, Adhering to back precautions Toilet Transfer: Minimal assistance, RW Toilet Transfer Details (indicate cue type and reason): min A for powering into standing and verbal cues for hand placement Toileting- Clothing Manipulation and Hygiene: Maximal assistance, +2 for physical assistance, +2 for safety/equipment, Sitting/lateral lean, Sit to/from stand Functional mobility during ADLs: Minimal assistance, Rolling walker General ADL Comments: pt limited by pain this session, some impulsivity with transfer due to pain    Mobility  Overal bed mobility: Needs Assistance Bed Mobility: Sit to Sidelying, Rolling Rolling: Min assist Sidelying to sit: Mod assist Sit to sidelying: Mod assist, +2 for safety/equipment General bed mobility comments: mod A for management of BLE and verbal  cues for proper positioning, second person present for safety but not needing to physically assist at shoulders; pt needs heavy +2 assist with bed flat and transfer pad for posterior supine scooting toward HOB, attempting to assist with BLE    Transfers  Overall transfer level: Needs assistance Equipment used: Rolling walker (2 wheeled) Transfers: Sit to/from Stand, Stand Pivot Transfers Sit to Stand: Mod assist, +2 physical assistance Stand pivot transfers: Min assist, +2 physical assistance General transfer comment: pt needs visual/verbal cues for safe technique/using momentum strategy to rise and needing two attempts to rise from chair; pt attempting to pull up on RW initially despite cues to push from chair    Ambulation / Gait / Stairs /  Wheelchair Mobility  Ambulation/Gait Ambulation/Gait assistance: Min assist, +2 physical assistance, +2 safety/equipment Gait Distance (Feet): 6 Feet Assistive device: Rolling walker (2 wheeled) Gait Pattern/deviations: Decreased stride length, Trunk flexed, Wide base of support, Step-to pattern General Gait Details: heavy BUE reliance on RW, small low steps, pt grimacing and c/o R hip pain with weight bearing; HR to 120's bpm with exertion; self-limiting distance due to lack of premedication (not due for another 30 mins) Gait velocity: dec Gait velocity interpretation: <1.31 ft/sec, indicative of household ambulator    Posture / Balance Dynamic Sitting Balance Sitting balance - Comments: Requires at least 1 UE support on the bed for support due to pain Balance Overall balance assessment: Needs assistance Sitting-balance support: Feet supported, Bilateral upper extremity supported Sitting balance-Leahy Scale: Fair Sitting balance - Comments: Requires at least 1 UE support on the bed for support due to pain Postural control: Posterior lean Standing balance support: Bilateral upper extremity supported, During functional activity Standing balance-Leahy  Scale: Poor Standing balance comment: Reliant on B UE support on the RW    Special needs/care consideration Home CPAP   Previous Home Environment  Living Arrangements: Spouse/significant other, Children  Lives With: Spouse Available Help at Discharge: Family, Available 24 hours/day Type of Home: House Home Layout: One level Home Access: Stairs to enter Entrance Stairs-Rails: None Entrance Stairs-Number of Steps: 3 Bathroom Shower/Tub: Chiropodist: Handicapped height Bathroom Accessibility: Yes How Accessible: Accessible via walker [place]: No  Discharge Living Setting Plans for Discharge Living Setting: Patient's home, Lives with (comment) (wife) Type of Home at Discharge: House Discharge Home Layout: One level Discharge Home Access: Stairs to enter Entrance Stairs-Rails: None Entrance Stairs-Number of Steps: 3 Discharge Bathroom Shower/Tub: Tub/shower unit Discharge Bathroom Toilet: Handicapped height Discharge Bathroom Accessibility: Yes How Accessible: Accessible via walker Does the patient have any problems obtaining your medications?: No  Social/Family/Support Systems Patient Roles: Spouse Contact Information: wife, [name] Anticipated Caregiver: wife Anticipated Ambulance person Information: wife Ability/Limitations of Caregiver: wife works at [employer] Availability: 24/7 Discharge Plan Discussed with Primary Caregiver: Yes Is Caregiver In Agreement with Plan?: Yes Does Caregiver/Family have Issues with Lodging/Transportation while Pt is in Rehab?: No  Goals Patient/Family Goal for Rehab: Mod I to supervision with PT and OT Expected length of stay: ELOS 10 to 14 days Pt/Family Agrees to Admission and willing to participate: Yes Program Orientation Provided & Reviewed with Pt/Caregiver Including Roles  & Responsibilities: Yes  Decrease burden of Care through IP rehab admission: n/a  Possible need for SNF placement  upon discharge: not anticipated  Patient Condition: I have reviewed [facility] records from [facility] , spoken with CM, and patient and spouse. I met with patient at the bedside for inpatient rehabilitation assessment.  Patient will benefit from ongoing PT and OT, can actively participate in 3 hours of therapy a day 5 days of the week, and can make measurable gains during the admission.  Patient will also benefit from the coordinated team approach during an Inpatient Acute Rehabilitation admission.  The patient will receive intensive therapy as well as Rehabilitation physician, nursing, social worker, and care management interventions.  Due to bladder management, bowel management, safety, skin/wound care, disease management, medication administration, pain management, and patient education the patient requires 24 hour a day rehabilitation nursing.  The patient is currently mod assist overall  with mobility and basic ADLs.  Discharge setting and therapy post discharge at home with home health is anticipated.  Patient  has agreed to participate in the Acute Inpatient Rehabilitation Program and will admit today.  Preadmission Screen Completed By:  Cleatrice Burke, 01/18/2021 10:52 AM ______________________________________________________________________   Discussed status with Dr. Ranell Patrick on 01/18/2021 at 1053 and received approval for admission today.  Admission Coordinator:  Cleatrice Burke, RN, time 2035 Date 01/18/2021   Assessment/Plan: Diagnosis: Lumbar radiculopathy Does the need for close, 24 hr/day Medical supervision in concert with the patient's rehab needs make it unreasonable for this patient to be served in a less intensive setting? Yes Co-Morbidities requiring supervision/potential complications: Urinary retention, severe muscle spasm, orthostasis, controlled type 2 diabetes mellitus, hyperlipidemia Due to bladder management, bowel management, safety, skin/wound care,  disease management, medication administration, pain management, and patient education, does the patient require 24 hr/day rehab nursing? Yes Does the patient require coordinated care of a physician, rehab nurse, PT, OT to address physical and functional deficits in the context of the above medical diagnosis(es)? Yes Addressing deficits in the following areas: balance, endurance, locomotion, strength, transferring, bowel/bladder control, bathing, dressing, feeding, grooming, toileting, and psychosocial support Can the patient actively participate in an intensive therapy program of at least 3 hrs of therapy 5 days a week? Yes The potential for patient to make measurable gains while on inpatient rehab is excellent Anticipated functional outcomes upon discharge from inpatient rehab: modified independent PT, modified independent OT, independent SLP Estimated rehab length of stay to reach the above functional goals is: 14 days Anticipated discharge destination: Home 10. Overall Rehab/Functional Prognosis: excellent   MD Signature: Leeroy Cha, MD

## 2021-01-17 NOTE — Progress Notes (Signed)
Inpatient Rehabilitation Admissions Coordinator   I await bed availably on CIR to admit. Hopeful for the next 24 to 48 hrs.  Danne Baxter, RN, MSN Rehab Admissions Coordinator [phone] 01/17/2021 12:50 PM

## 2021-01-17 NOTE — Progress Notes (Addendum)
Occupational Therapy Treatment Patient Details Name: [name] MRN: [id number] DOB: [birth year] Today's Date: 01/17/2021   History of present illness Pt is a 55 y/o male who presents s/p L1-S1 TLIF on 01/11/2021. PMH significant for DDD, DM, HTN.   OT comments  Patient continues to participate despite pain to R side that radiates.  He was able to roll better with Min Guard, supine to sit with closer to Min a to elevate trunk.  He was able to stand with elevated surface and Min A, and able to walk to the hall with Min Guard/Min A at RW level.  Patient declines to sit trial sitting on regular toilet due to anticipated pain.  OT to continue to follow in the acute setting, but CIR has been recommended.     Recommendations for follow up therapy are one component of a multi-disciplinary discharge planning process, led by the attending physician.  Recommendations may be updated based on patient status, additional functional criteria and insurance authorization.    Follow Up Recommendations  CIR    Equipment Recommendations  Other (comment)    Recommendations for Other Services      Precautions / Restrictions Precautions Precautions: Fall;Back Required Braces or Orthoses: Spinal Brace Spinal Brace: Lumbar corset;Applied in sitting position Other Brace: for comfort, donned seated Restrictions Weight Bearing Restrictions: No       Mobility Bed Mobility Overal bed mobility: Needs Assistance Bed Mobility: Rolling;Sidelying to Sit Rolling: Min assist Sidelying to sit: Mod assist            Transfers Overall transfer level: Needs assistance Equipment used: Rolling walker (2 wheeled) Transfers: Sit to/from [facility] Sit to Stand: Min assist Stand pivot transfers: Min assist            Balance Overall balance assessment: Needs assistance Sitting-balance support: Feet supported;Bilateral upper extremity supported Sitting balance-Leahy Scale: Fair      Standing balance support: Bilateral upper extremity supported;During functional activity Standing balance-Leahy Scale: Poor Standing balance comment: Reliant on B UE support on the RW                           ADL either performed or assessed with clinical judgement   ADL                           Toilet Transfer: Minimal assistance;RW           Functional mobility during ADLs: Minimal assistance;Rolling walker                         Cognition Arousal/Alertness: Awake/alert Behavior During Therapy: WFL for tasks assessed/performed Overall Cognitive Status: Within Functional Limits for tasks assessed                                                            Pertinent Vitals/ Pain       Pain Assessment: Faces Faces Pain Scale: Hurts even more Pain Location: continues: low back pain worse on R side Pain Descriptors / Indicators: Operative site guarding;Aching;Grimacing;Radiating Pain Intervention(s): Monitored during session  Frequency  Min 2X/week        Progress Toward Goals  OT Goals(current goals can now be found in the care plan section)  Progress towards OT goals: Progressing toward goals  Acute Rehab OT Goals Patient Stated Goal: move better with less pain OT Goal Formulation: With patient Time For Goal Achievement: 01/26/21 Potential to Achieve Goals: Good  Plan Discharge plan remains appropriate    Co-evaluation                 AM-PAC OT "6 Clicks" Daily Activity     Outcome Measure   Help from another person eating meals?: None Help from another person taking care of personal grooming?: None Help from another person toileting, which includes using toliet, bedpan, or urinal?: A Lot Help from another person bathing (including washing, rinsing, drying)?: A Lot Help from another person to put on and taking  off regular upper body clothing?: A Little Help from another person to put on and taking off regular lower body clothing?: A Lot 6 Click Score: 17    End of Session Equipment Utilized During Treatment: Rolling walker;Back brace  OT Visit Diagnosis: Unsteadiness on feet (R26.81);Other abnormalities of gait and mobility (R26.89);Muscle weakness (generalized) (M62.81)   Activity Tolerance Patient limited by pain   Patient Left in chair;with call bell/phone within reach   Nurse Communication          Time: 5038-8828 OT Time Calculation (min): 21 min  Charges: OT General Charges $OT Visit: 1 Visit  $Therapeutic Activity  01/17/2021  RP, OTR/L  Acute Rehabilitation Services  Office:  [phone]   Metta Clines 01/17/2021, 12:21 PM

## 2021-01-18 ENCOUNTER — Inpatient Hospital Stay (HOSPITAL_COMMUNITY): Payer: 59

## 2021-01-18 ENCOUNTER — Inpatient Hospital Stay (HOSPITAL_COMMUNITY)
Admission: RE | Admit: 2021-01-18 | Discharge: 2021-01-25 | DRG: 560 | Disposition: A | Discharge status: Other Acute Inpatient Hospital | Payer: 59 | Source: Intra-hospital | Attending: Physical Medicine and Rehabilitation | Admitting: Physical Medicine and Rehabilitation

## 2021-01-18 ENCOUNTER — Encounter (HOSPITAL_COMMUNITY): Payer: Self-pay | Admitting: Physical Medicine and Rehabilitation

## 2021-01-18 ENCOUNTER — Other Ambulatory Visit: Payer: Self-pay

## 2021-01-18 DIAGNOSIS — Y831 Surgical operation with implant of artificial internal device as the cause of abnormal reaction of the patient, or of later complication, without mention of misadventure at the time of the procedure: Secondary | ICD-10-CM | POA: Diagnosis not present

## 2021-01-18 DIAGNOSIS — I129 Hypertensive chronic kidney disease with stage 1 through stage 4 chronic kidney disease, or unspecified chronic kidney disease: Secondary | ICD-10-CM | POA: Diagnosis present

## 2021-01-18 DIAGNOSIS — Z4789 Encounter for other orthopedic aftercare: Secondary | ICD-10-CM | POA: Diagnosis present

## 2021-01-18 DIAGNOSIS — R509 Fever, unspecified: Secondary | ICD-10-CM | POA: Diagnosis not present

## 2021-01-18 DIAGNOSIS — R2689 Other abnormalities of gait and mobility: Secondary | ICD-10-CM | POA: Diagnosis present

## 2021-01-18 DIAGNOSIS — B961 Klebsiella pneumoniae [K. pneumoniae] as the cause of diseases classified elsewhere: Secondary | ICD-10-CM | POA: Diagnosis present

## 2021-01-18 DIAGNOSIS — I959 Hypotension, unspecified: Secondary | ICD-10-CM | POA: Diagnosis not present

## 2021-01-18 DIAGNOSIS — B9689 Other specified bacterial agents as the cause of diseases classified elsewhere: Secondary | ICD-10-CM | POA: Diagnosis present

## 2021-01-18 DIAGNOSIS — N179 Acute kidney failure, unspecified: Secondary | ICD-10-CM | POA: Diagnosis present

## 2021-01-18 DIAGNOSIS — R161 Splenomegaly, not elsewhere classified: Secondary | ICD-10-CM

## 2021-01-18 DIAGNOSIS — L7634 Postprocedural seroma of skin and subcutaneous tissue following other procedure: Secondary | ICD-10-CM | POA: Diagnosis present

## 2021-01-18 DIAGNOSIS — R7881 Bacteremia: Secondary | ICD-10-CM | POA: Diagnosis not present

## 2021-01-18 DIAGNOSIS — T8141XA Infection following a procedure, superficial incisional surgical site, initial encounter: Secondary | ICD-10-CM | POA: Diagnosis present

## 2021-01-18 DIAGNOSIS — M5416 Radiculopathy, lumbar region: Secondary | ICD-10-CM | POA: Diagnosis not present

## 2021-01-18 DIAGNOSIS — Z6841 Body Mass Index (BMI) 40.0 and over, adult: Secondary | ICD-10-CM | POA: Diagnosis not present

## 2021-01-18 DIAGNOSIS — Z880 Allergy status to penicillin: Secondary | ICD-10-CM

## 2021-01-18 DIAGNOSIS — R109 Unspecified abdominal pain: Secondary | ICD-10-CM

## 2021-01-18 DIAGNOSIS — I10 Essential (primary) hypertension: Secondary | ICD-10-CM | POA: Diagnosis not present

## 2021-01-18 DIAGNOSIS — E119 Type 2 diabetes mellitus without complications: Secondary | ICD-10-CM

## 2021-01-18 DIAGNOSIS — M1711 Unilateral primary osteoarthritis, right knee: Secondary | ICD-10-CM | POA: Diagnosis present

## 2021-01-18 DIAGNOSIS — Z7982 Long term (current) use of aspirin: Secondary | ICD-10-CM

## 2021-01-18 DIAGNOSIS — Z7984 Long term (current) use of oral hypoglycemic drugs: Secondary | ICD-10-CM | POA: Diagnosis not present

## 2021-01-18 DIAGNOSIS — Z888 Allergy status to other drugs, medicaments and biological substances status: Secondary | ICD-10-CM

## 2021-01-18 DIAGNOSIS — Z20822 Contact with and (suspected) exposure to covid-19: Secondary | ICD-10-CM | POA: Diagnosis present

## 2021-01-18 DIAGNOSIS — M62838 Other muscle spasm: Secondary | ICD-10-CM | POA: Diagnosis present

## 2021-01-18 DIAGNOSIS — G629 Polyneuropathy, unspecified: Secondary | ICD-10-CM | POA: Diagnosis present

## 2021-01-18 DIAGNOSIS — E669 Obesity, unspecified: Secondary | ICD-10-CM | POA: Diagnosis present

## 2021-01-18 DIAGNOSIS — T8149XA Infection following a procedure, other surgical site, initial encounter: Secondary | ICD-10-CM | POA: Diagnosis not present

## 2021-01-18 DIAGNOSIS — G4733 Obstructive sleep apnea (adult) (pediatric): Secondary | ICD-10-CM | POA: Diagnosis present

## 2021-01-18 DIAGNOSIS — E876 Hypokalemia: Secondary | ICD-10-CM | POA: Diagnosis present

## 2021-01-18 DIAGNOSIS — E1122 Type 2 diabetes mellitus with diabetic chronic kidney disease: Secondary | ICD-10-CM | POA: Diagnosis present

## 2021-01-18 DIAGNOSIS — Z86718 Personal history of other venous thrombosis and embolism: Secondary | ICD-10-CM

## 2021-01-18 DIAGNOSIS — K5900 Constipation, unspecified: Secondary | ICD-10-CM | POA: Diagnosis present

## 2021-01-18 DIAGNOSIS — R339 Retention of urine, unspecified: Secondary | ICD-10-CM | POA: Diagnosis present

## 2021-01-18 DIAGNOSIS — E1169 Type 2 diabetes mellitus with other specified complication: Secondary | ICD-10-CM | POA: Diagnosis not present

## 2021-01-18 DIAGNOSIS — D62 Acute posthemorrhagic anemia: Secondary | ICD-10-CM | POA: Diagnosis present

## 2021-01-18 DIAGNOSIS — Z833 Family history of diabetes mellitus: Secondary | ICD-10-CM

## 2021-01-18 DIAGNOSIS — Z79899 Other long term (current) drug therapy: Secondary | ICD-10-CM

## 2021-01-18 DIAGNOSIS — Z981 Arthrodesis status: Secondary | ICD-10-CM | POA: Diagnosis not present

## 2021-01-18 DIAGNOSIS — E785 Hyperlipidemia, unspecified: Secondary | ICD-10-CM | POA: Diagnosis present

## 2021-01-18 DIAGNOSIS — K592 Neurogenic bowel, not elsewhere classified: Secondary | ICD-10-CM

## 2021-01-18 DIAGNOSIS — M4726 Other spondylosis with radiculopathy, lumbar region: Secondary | ICD-10-CM | POA: Diagnosis present

## 2021-01-18 LAB — GLUCOSE, CAPILLARY
Glucose-Capillary: 202 mg/dL — ABNORMAL HIGH (ref 70–99)
Glucose-Capillary: 243 mg/dL — ABNORMAL HIGH (ref 70–99)
Glucose-Capillary: 176 mg/dL — ABNORMAL HIGH (ref 70–99)

## 2021-01-18 LAB — HEMOGLOBIN AND HEMATOCRIT, BLOOD
HCT: 25.8 % — ABNORMAL LOW (ref 39.0–52.0)
HCT: 26 % — ABNORMAL LOW (ref 39.0–52.0)
Hemoglobin: 9 g/dL — ABNORMAL LOW (ref 13.0–17.0)
Hemoglobin: 8.8 g/dL — ABNORMAL LOW (ref 13.0–17.0)

## 2021-01-18 MED ORDER — TROLAMINE SALICYLATE 10 % EX CREA
TOPICAL_CREAM | Freq: Three times a day (TID) | CUTANEOUS | Status: DC | PRN
  Filled 2021-01-18: qty 85

## 2021-01-18 MED ORDER — TIZANIDINE HCL 4 MG PO TABS: 4.0000 mg | ORAL_TABLET | Freq: Four times a day (QID) | ORAL | Status: DC | PRN

## 2021-01-18 MED ORDER — TROLAMINE SALICYLATE 10 % EX CREA
TOPICAL_CREAM | Freq: Three times a day (TID) | CUTANEOUS | Status: DC
  Filled 2021-01-18: qty 85

## 2021-01-18 MED ORDER — INSULIN ASPART 100 UNIT/ML IJ SOLN: 0.0000 [IU] | Freq: Every day | INTRAMUSCULAR | Status: DC

## 2021-01-18 MED ORDER — OXYCODONE HCL 10 MG PO TABS: 10.0000 mg | ORAL_TABLET | ORAL | 0 refills | Status: DC | PRN

## 2021-01-18 MED ORDER — TIZANIDINE HCL 4 MG PO TABS
4.0000 mg | ORAL_TABLET | Freq: Three times a day (TID) | ORAL | Status: DC
  Administered 2021-01-18 – 2021-01-25 (×21): 4 mg via ORAL
  Filled 2021-01-18 (×21): qty 1

## 2021-01-18 MED ORDER — POLYETHYLENE GLYCOL 3350 17 G PO PACK
17.0000 g | PACK | Freq: Every day | ORAL | Status: DC
  Administered 2021-01-19 – 2021-01-24 (×6): 17 g via ORAL
  Filled 2021-01-18 (×7): qty 1

## 2021-01-18 MED ORDER — SORBITOL 70 % SOLN
60.0000 mL | Freq: Once | Status: AC
  Administered 2021-01-18: 60 mL via ORAL
  Filled 2021-01-18: qty 60

## 2021-01-18 MED ORDER — TRAZODONE HCL 50 MG PO TABS
25.0000 mg | ORAL_TABLET | Freq: Every evening | ORAL | Status: DC | PRN
  Administered 2021-01-24: 50 mg via ORAL
  Filled 2021-01-18: qty 1

## 2021-01-18 MED ORDER — BISACODYL 10 MG RE SUPP: 10.0000 mg | Freq: Every day | RECTAL | Status: DC | PRN

## 2021-01-18 MED ORDER — MORPHINE SULFATE ER 15 MG PO TBCR
15.0000 mg | EXTENDED_RELEASE_TABLET | Freq: Two times a day (BID) | ORAL | Status: DC
  Administered 2021-01-18 – 2021-01-25 (×14): 15 mg via ORAL
  Filled 2021-01-18 (×14): qty 1

## 2021-01-18 MED ORDER — MUSCLE RUB 10-15 % EX CREA
TOPICAL_CREAM | Freq: Three times a day (TID) | CUTANEOUS | Status: DC
  Administered 2021-01-19: 1 via TOPICAL
  Filled 2021-01-18: qty 85

## 2021-01-18 MED ORDER — ENOXAPARIN SODIUM 40 MG/0.4ML IJ SOSY: 40.0000 mg | PREFILLED_SYRINGE | INTRAMUSCULAR | Status: DC

## 2021-01-18 MED ORDER — ALUM & MAG HYDROXIDE-SIMETH 200-200-20 MG/5ML PO SUSP
30.0000 mL | ORAL | Status: DC | PRN
Start: 2021-01-18 — End: 2021-01-25
  Administered 2021-01-22: 30 mL via ORAL
  Filled 2021-01-18: qty 30

## 2021-01-18 MED ORDER — MUSCLE RUB 10-15 % EX CREA
TOPICAL_CREAM | Freq: Three times a day (TID) | CUTANEOUS | Status: DC | PRN
  Filled 2021-01-18: qty 85

## 2021-01-18 MED ORDER — DICLOFENAC SODIUM 1 % EX GEL
2.0000 g | Freq: Four times a day (QID) | CUTANEOUS | Status: DC
  Administered 2021-01-18 – 2021-01-25 (×23): 2 g via TOPICAL
  Filled 2021-01-18: qty 100

## 2021-01-18 MED ORDER — METFORMIN HCL 500 MG PO TABS
1000.0000 mg | ORAL_TABLET | Freq: Two times a day (BID) | ORAL | Status: DC
  Administered 2021-01-18 – 2021-01-25 (×14): 1000 mg via ORAL
  Filled 2021-01-18 (×14): qty 2

## 2021-01-18 MED ORDER — GABAPENTIN 600 MG PO TABS
600.0000 mg | ORAL_TABLET | Freq: Three times a day (TID) | ORAL | Status: DC
  Administered 2021-01-18 – 2021-01-21 (×9): 600 mg via ORAL
  Filled 2021-01-18 (×9): qty 1

## 2021-01-18 MED ORDER — MAGNESIUM OXIDE -MG SUPPLEMENT 400 (240 MG) MG PO TABS
200.0000 mg | ORAL_TABLET | Freq: Two times a day (BID) | ORAL | Status: DC
  Administered 2021-01-18 – 2021-01-25 (×15): 200 mg via ORAL
  Filled 2021-01-18 (×15): qty 1

## 2021-01-18 MED ORDER — FLEET ENEMA 7-19 GM/118ML RE ENEM
1.0000 | ENEMA | Freq: Once | RECTAL | Status: AC | PRN
  Administered 2021-01-22: 1 via RECTAL
  Filled 2021-01-18: qty 1

## 2021-01-18 MED ORDER — DOXYCYCLINE HYCLATE 100 MG PO TABS
100.0000 mg | ORAL_TABLET | Freq: Two times a day (BID) | ORAL | Status: DC
  Administered 2021-01-18 – 2021-01-21 (×7): 100 mg via ORAL
  Filled 2021-01-18 (×7): qty 1

## 2021-01-18 MED ORDER — MORPHINE SULFATE ER 15 MG PO TBCR: 15.0000 mg | EXTENDED_RELEASE_TABLET | Freq: Two times a day (BID) | ORAL | Status: DC

## 2021-01-18 MED ORDER — POLYETHYLENE GLYCOL 3350 17 G PO PACK
17.0000 g | PACK | Freq: Every day | ORAL | Status: DC | PRN
Start: 2021-01-18 — End: 2021-01-25
  Administered 2021-01-20: 17 g via ORAL
  Filled 2021-01-18: qty 1

## 2021-01-18 MED ORDER — PROCHLORPERAZINE 25 MG RE SUPP: 12.5000 mg | Freq: Four times a day (QID) | RECTAL | Status: DC | PRN

## 2021-01-18 MED ORDER — ROSUVASTATIN CALCIUM 20 MG PO TABS: 20.0000 mg | ORAL_TABLET | Freq: Every day | ORAL | Status: DC

## 2021-01-18 MED ORDER — GUAIFENESIN-DM 100-10 MG/5ML PO SYRP: 5.0000 mL | ORAL_SOLUTION | Freq: Four times a day (QID) | ORAL | Status: DC | PRN

## 2021-01-18 MED ORDER — ADULT MULTIVITAMIN W/MINERALS CH
1.0000 | ORAL_TABLET | Freq: Every day | ORAL | Status: DC
  Administered 2021-01-18 – 2021-01-25 (×8): 1 via ORAL
  Filled 2021-01-18 (×8): qty 1

## 2021-01-18 MED ORDER — PROCHLORPERAZINE MALEATE 5 MG PO TABS
5.0000 mg | ORAL_TABLET | Freq: Four times a day (QID) | ORAL | Status: DC | PRN
  Administered 2021-01-21 (×2): 10 mg via ORAL
  Filled 2021-01-18 (×2): qty 2

## 2021-01-18 MED ORDER — ENOXAPARIN SODIUM 40 MG/0.4ML IJ SOSY
40.0000 mg | PREFILLED_SYRINGE | INTRAMUSCULAR | Status: DC
  Administered 2021-01-18 – 2021-01-24 (×7): 40 mg via SUBCUTANEOUS
  Filled 2021-01-18 (×7): qty 0.4

## 2021-01-18 MED ORDER — JUVEN PO PACK
1.0000 | PACK | Freq: Two times a day (BID) | ORAL | Status: DC
  Administered 2021-01-18 – 2021-01-24 (×14): 1 via ORAL
  Filled 2021-01-18 (×13): qty 1

## 2021-01-18 MED ORDER — DIPHENHYDRAMINE HCL 12.5 MG/5ML PO ELIX
12.5000 mg | ORAL_SOLUTION | Freq: Four times a day (QID) | ORAL | Status: DC | PRN
Start: 2021-01-18 — End: 2021-01-25

## 2021-01-18 MED ORDER — ACETAMINOPHEN 325 MG PO TABS
325.0000 mg | ORAL_TABLET | ORAL | Status: DC | PRN
  Administered 2021-01-20 – 2021-01-24 (×3): 650 mg via ORAL
  Filled 2021-01-18 (×4): qty 2

## 2021-01-18 MED ORDER — TRAMADOL HCL 50 MG PO TABS
50.0000 mg | ORAL_TABLET | Freq: Four times a day (QID) | ORAL | Status: DC | PRN
  Administered 2021-01-19 – 2021-01-24 (×5): 50 mg via ORAL
  Filled 2021-01-18 (×5): qty 1

## 2021-01-18 MED ORDER — OXYCODONE HCL 5 MG PO TABS
10.0000 mg | ORAL_TABLET | ORAL | Status: DC | PRN
  Administered 2021-01-18 – 2021-01-23 (×15): 10 mg via ORAL
  Filled 2021-01-18 (×15): qty 2

## 2021-01-18 MED ORDER — INSULIN ASPART 100 UNIT/ML IJ SOLN
0.0000 [IU] | Freq: Three times a day (TID) | INTRAMUSCULAR | Status: DC
  Administered 2021-01-18 – 2021-01-19 (×3): 3 [IU] via SUBCUTANEOUS
  Administered 2021-01-19 – 2021-01-20 (×2): 5 [IU] via SUBCUTANEOUS
  Administered 2021-01-20 – 2021-01-21 (×4): 3 [IU] via SUBCUTANEOUS
  Administered 2021-01-21: 8 [IU] via SUBCUTANEOUS
  Administered 2021-01-22 (×3): 3 [IU] via SUBCUTANEOUS
  Administered 2021-01-23: 5 [IU] via SUBCUTANEOUS
  Administered 2021-01-24: 2 [IU] via SUBCUTANEOUS
  Administered 2021-01-24: 3 [IU] via SUBCUTANEOUS
  Administered 2021-01-24: 2 [IU] via SUBCUTANEOUS

## 2021-01-18 MED ORDER — TIZANIDINE HCL 2 MG PO TABS: 2.0000 mg | ORAL_TABLET | Freq: Three times a day (TID) | ORAL | Status: DC

## 2021-01-18 MED ORDER — PROCHLORPERAZINE EDISYLATE 10 MG/2ML IJ SOLN: 5.0000 mg | Freq: Four times a day (QID) | INTRAMUSCULAR | Status: DC | PRN

## 2021-01-18 MED ORDER — LIDOCAINE HCL URETHRAL/MUCOSAL 2 % EX GEL: CUTANEOUS | Status: DC | PRN

## 2021-01-18 NOTE — Progress Notes (Signed)
Inpatient Rehabilitation Admissions Coordinator   CIR bed is available to admit to today. I have contacted Dr Ronnald Ramp who is covering today to arrange D/C. Acute team and TOC made aware. I will make the arrangements to admit today.  Danne Baxter, RN, MSN Rehab Admissions Coordinator [phone] 01/18/2021 10:07 AM

## 2021-01-18 NOTE — H&P (Addendum)
Physical Medicine and Rehabilitation Admission H&P    CC: Lumbar radiculopathy   HPI: [name] is a 55 year old male with history of DDD, T2DM, OSA, obesity, lumbar stenosis with RLE radiculopathy and significant pain limiting functional status for past 6 months. He was found have lumbar spondylosis with severe canal stenosis from L1/2 to L4/L5.  He was admitted on 01/11/21 for L1-S1 laminectomies with PLF by Dr. Zada Finders. Hospital course significant for hypotension as well as lethargy requiring fluid boluses and PCCM consulted for input. He was noted to have elevated CK at 2972, lactic acidosis,  low magnesium levels -1.5, intermittent tachycardia with HR in 120's and ABLA with drop in H/H to 7.9 as well as hyponatremia with Na-128. Hyponatremia felt to be dilutional from fluid boluses. CK trending down to 1970 on 10/04 and improvement noted in Na-131. Blood pressures improving and trending upwards but he continues to be limited by orthostatic symptoms as well as pain with activity. Has urinary retention therefore foley placed but also reports appetite poor as has not had BM for 7 days (since surgery)  He was started on levemir for labile BS on regular diet.  He continues to have issues with bleeding from incision, pain, neuropathy and spasticity affecting functional status. CIR recommended due to functional decline.      Review of Systems  Constitutional: Negative.   HENT: Negative.    Eyes: Negative.   Respiratory: Negative.    Cardiovascular: Negative.   Gastrointestinal: Negative.   Genitourinary: Negative.   Musculoskeletal:  Positive for myalgias.  Skin: Negative.   Neurological:  Positive for sensory change and weakness.  Endo/Heme/Allergies:  Bruises/bleeds easily.  Psychiatric/Behavioral: Negative.      Past Medical History:  Diagnosis Date   DDD (degenerative disc disease), lumbar    Diabetes mellitus    Hyperlipemia    Hypertension    Obesity    OSA (obstructive  sleep apnea)    on CPAP    Past Surgical History:  Procedure Laterality Date   APPLICATION OF ROBOTIC ASSISTANCE FOR SPINAL PROCEDURE N/A 01/11/2021   Procedure: APPLICATION OF ROBOTIC ASSISTANCE FOR SPINAL PROCEDURE;  Surgeon: Judith Part, MD;  Location: [facility];  Service: Neurosurgery;  Laterality: N/A;   BACK SURGERY     COLONOSCOPY  2019   TRANSFORAMINAL LUMBAR INTERBODY FUSION (TLIF) WITH PEDICLE SCREW FIXATION 4 LEVEL N/A 01/11/2021   Procedure: Lumbar one-two, Lumbar two-three, Lumbar three-four, Lumbar four-five, Lumbar five Sacral one Open decompression, Transforaminal lumbar interbody fusion, posterolateral instrumented fusion;  Surgeon: Judith Part, MD;  Location: [facility];  Service: Neurosurgery;  Laterality: N/A;    Family History  Problem Relation Age of Onset   Diabetes Mother    Hypertension Neg Hx    Coronary artery disease Neg Hx    Colon cancer Neg Hx    Prostate cancer Neg Hx     Social History:  reports that he has never smoked. He has never used smokeless tobacco. He reports that he does not drink alcohol and does not use drugs.   Allergies  Allergen Reactions   Cymbalta [Duloxetine Hcl] Other (See Comments)   Penicillins Other (See Comments)    REACTION: Blister and Sores in the mouth    Medications Prior to Admission  Medication Sig Dispense Refill   aspirin 81 MG tablet Take 81 mg by mouth daily.       blood glucose meter kit and supplies Use up to four times daily as directed.  Dx: E11.9 1 each 0   Blood Glucose Monitoring Suppl ([place]) w/Device KIT Test blood sugar twice a day.  Dx code: E11.9 1 kit 0   fluticasone (FLONASE) 50 MCG/ACT nasal spray Place 2 sprays into both nostrils daily. (Patient taking differently: Place 2 sprays into both nostrils 2 (two) times daily.) 16 g 5   gabapentin (NEURONTIN) 600 MG tablet Take 1 tablet (600 mg total) by mouth 3 (three) times daily. 90 tablet 5   glucose blood (ONETOUCH VERIO)  test strip Check blood sugars twice daily 200 strip 12   losartan-hydrochlorothiazide (HYZAAR) 100-12.5 MG tablet TAKE 1 TABLET BY MOUTH EVERY DAY 90 tablet 1   metFORMIN (GLUCOPHAGE) 1000 MG tablet Take 1 tablet (1,000 mg total) by mouth 2 (two) times daily with a meal. 180 tablet 1   NALTREXONE HCL PO Take 4 mg by mouth daily.     OneTouch Delica Lancets 31D MISC Test blood sugar twice a day.  Dx code: E11.9 200 each 12   oxyCODONE 10 MG TABS Take 1 tablet (10 mg total) by mouth every 4 (four) hours as needed for severe pain ((score 7 to 10)). 30 tablet 0   rosuvastatin (CRESTOR) 20 MG tablet Take 1 tablet (20 mg total) by mouth daily. 90 tablet 3   tadalafil (CIALIS) 20 MG tablet TAKE 1/2 TO 1 TABLET BY MOUTH DAILY AS NEEDED FOR ERECTILE DYSFUNCTION 30 tablet 3   tiZANidine (ZANAFLEX) 4 MG tablet Take 1 tablet (4 mg total) by mouth every 6 (six) hours as needed for muscle spasms. 120 tablet 5    Drug Regimen Review  Drug regimen was reviewed and remains appropriate with no significant issues identified  Home: Home Living Family/patient expects to be discharged to:: Private residence Living Arrangements: Spouse/significant other, Children Available Help at Discharge: Family, Available 24 hours/day Type of Home: House Home Access: Stairs to enter [employer] of Steps: 3 Entrance Stairs-Rails: None Home Layout: One level Bathroom Shower/Tub: Chiropodist: Handicapped height Bathroom Accessibility: Yes Home Equipment: None  Lives With: Spouse   Functional History: Prior Function Level of Independence: Independent Comments: Have not worked since May 28th due to pain. Independent with ADL's. Driving. Community ambulator with a shopping cart.   Functional Status:  Mobility: Bed Mobility Overal bed mobility: Needs Assistance Bed Mobility: Sit to Sidelying, Rolling Rolling: Min assist Sidelying to sit: Mod assist Sit to sidelying: Mod assist, +2 for  safety/equipment General bed mobility comments: mod A for management of BLE and verbal cues for proper positioning, second person present for safety but not needing to physically assist at shoulders; pt needs heavy +2 assist with bed flat and transfer pad for posterior supine scooting toward HOB, attempting to assist with BLE Transfers Overall transfer level: Needs assistance Equipment used: Rolling walker (2 wheeled) Transfers: Sit to/from Stand, Stand Pivot Transfers Sit to Stand: Mod assist, +2 physical assistance Stand pivot transfers: Min assist, +2 physical assistance General transfer comment: pt needs visual/verbal cues for safe technique/using momentum strategy to rise and needing two attempts to rise from chair; pt attempting to pull up on RW initially despite cues to push from chair Ambulation/Gait Ambulation/Gait assistance: Min assist, +2 physical assistance, +2 safety/equipment Gait Distance (Feet): 6 Feet Assistive device: Rolling walker (2 wheeled) Gait Pattern/deviations: Decreased stride length, Trunk flexed, Wide base of support, Step-to pattern General Gait Details: heavy BUE reliance on RW, small low steps, pt grimacing and c/o R hip pain with weight bearing;  HR to 120's bpm with exertion; self-limiting distance due to lack of premedication (not due for another 30 mins) Gait velocity: dec Gait velocity interpretation: <1.31 ft/sec, indicative of household ambulator   ADL: ADL Overall ADL's : Needs assistance/impaired Eating/Feeding: Independent, Sitting Grooming: Set up, Sitting Upper Body Bathing: Min guard, Minimal assistance, Sitting Lower Body Bathing: Maximal assistance, +2 for physical assistance, +2 for safety/equipment, Sitting/lateral leans, Sit to/from stand Upper Body Dressing : Min guard, Sitting Lower Body Dressing: Maximal assistance, +2 for physical assistance, +2 for safety/equipment, Sitting/lateral leans, Sit to/from stand, Adhering to back  precautions Toilet Transfer: Minimal assistance, RW Toilet Transfer Details (indicate cue type and reason): min A for powering into standing and verbal cues for hand placement Toileting- Clothing Manipulation and Hygiene: Maximal assistance, +2 for physical assistance, +2 for safety/equipment, Sitting/lateral lean, Sit to/from stand Functional mobility during ADLs: Minimal assistance, Rolling walker General ADL Comments: pt limited by pain this session, some impulsivity with transfer due to pain   Cognition: Cognition Overall Cognitive Status: Within Functional Limits for tasks assessed Orientation Level: Oriented X4 Cognition Arousal/Alertness: Awake/alert Behavior During Therapy: WFL for tasks assessed/performed Overall Cognitive Status: Within Functional Limits for tasks assessed General Comments: cooperative, mildly anxious TD:DUKG prior to mobility and needs mod cues for safe technique with transfers due to internal distraction  Blood pressure 101/76, pulse 96, temperature 98.9 F (37.2 C), resp. rate 14, SpO2 93 %. Physical Exam Gen: no distress, normal appearing HEENT: oral mucosa pink and moist, NCAT Cardio: Reg rate Chest: normal effort, normal rate of breathing Abd: soft, non-distended Ext: no edema Psych: pleasant, normal affect Skin: incision with drainage and boggy area Neuro: Musculoskeletal: 5/5 strength throughout, sensation intact throughout  Results for orders placed or performed during the hospital encounter of 01/11/21 (from the past 48 hour(s))  Hemoglobin and hematocrit, blood     Status: Abnormal   Collection Time: 01/16/21  1:48 PM  Result Value Ref Range   Hemoglobin 9.4 (L) 13.0 - 17.0 g/dL   HCT 27.7 (L) 39.0 - 52.0 %    Comment: Performed at [facility], [address], [place], [place] [address]  Glucose, capillary     Status: Abnormal   Collection Time: 01/16/21  4:14 PM  Result Value Ref Range   Glucose-Capillary 275 (H) 70 - 99 mg/dL     Comment: Glucose reference range applies only to samples taken after fasting for at least 8 hours.  Glucose, capillary     Status: Abnormal   Collection Time: 01/16/21  9:18 PM  Result Value Ref Range   Glucose-Capillary 322 (H) 70 - 99 mg/dL    Comment: Glucose reference range applies only to samples taken after fasting for at least 8 hours.   Comment 1 Notify RN    Comment 2 Document in Chart   Hemoglobin and hematocrit, blood     Status: Abnormal   Collection Time: 01/16/21  9:50 PM  Result Value Ref Range   Hemoglobin 8.6 (L) 13.0 - 17.0 g/dL   HCT 25.7 (L) 39.0 - 52.0 %    Comment: Performed at [facility], [place] [address], [place], [place] [address]  Hemoglobin and hematocrit, blood     Status: Abnormal   Collection Time: 01/17/21  6:11 AM  Result Value Ref Range   Hemoglobin 8.9 (L) 13.0 - 17.0 g/dL   HCT 26.3 (L) 39.0 - 52.0 %    Comment: Performed at [facility]  [address] [place], [place] [address]  Glucose, capillary     Status: Abnormal   Collection Time: 01/17/21  6:22 AM  Result Value Ref Range   Glucose-Capillary 193 (H) 70 - 99 mg/dL    Comment: Glucose reference range applies only to samples taken after fasting for at least 8 hours.   Comment 1 Notify RN    Comment 2 Document in Chart   Glucose, capillary     Status: Abnormal   Collection Time: 01/17/21 11:52 AM  Result Value Ref Range   Glucose-Capillary 326 (H) 70 - 99 mg/dL    Comment: Glucose reference range applies only to samples taken after fasting for at least 8 hours.  Hemoglobin and hematocrit, blood     Status: Abnormal   Collection Time: 01/17/21  1:49 PM  Result Value Ref Range   Hemoglobin 9.2 (L) 13.0 - 17.0 g/dL   HCT 26.6 (L) 39.0 - 52.0 %    Comment: Performed at [facility], [place] [address] [place], [place] [address]  Glucose, capillary     Status: Abnormal   Collection Time: 01/17/21  4:46 PM  Result Value Ref Range   Glucose-Capillary 186 (H) 70 - 99 mg/dL     Comment: Glucose reference range applies only to samples taken after fasting for at least 8 hours.  Hemoglobin and hematocrit, blood     Status: Abnormal   Collection Time: 01/17/21  9:35 PM  Result Value Ref Range   Hemoglobin 8.5 (L) 13.0 - 17.0 g/dL   HCT 24.6 (L) 39.0 - 52.0 %    Comment: Performed at [facility], [place] [address] [place], [place] [address]  Glucose, capillary     Status: Abnormal   Collection Time: 01/17/21  9:44 PM  Result Value Ref Range   Glucose-Capillary 266 (H) 70 - 99 mg/dL    Comment: Glucose reference range applies only to samples taken after fasting for at least 8 hours.  Hemoglobin and hematocrit, blood     Status: Abnormal   Collection Time: 01/18/21  6:05 AM  Result Value Ref Range   Hemoglobin 9.0 (L) 13.0 - 17.0 g/dL   HCT 25.8 (L) 39.0 - 52.0 %    Comment: Performed at [facility] [address], [place], [place] [address]  Glucose, capillary     Status: Abnormal   Collection Time: 01/18/21  6:28 AM  Result Value Ref Range   Glucose-Capillary 202 (H) 70 - 99 mg/dL    Comment: Glucose reference range applies only to samples taken after fasting for at least 8 hours.  Glucose, capillary     Status: Abnormal   Collection Time: 01/18/21 11:30 AM  Result Value Ref Range   Glucose-Capillary 243 (H) 70 - 99 mg/dL    Comment: Glucose reference range applies only to samples taken after fasting for at least 8 hours.   No results found.     Medical Problem List and Plan: 1.  Impaired mobility s/p TLIF for lumbar radiculoapthy  -patient may shower but incision must be covered  -ELOS/Goals: 14 days   -Admit to CIR 2.  Antithrombotics: -DVT/anticoagulation:  Pharmaceutical: Lovenox  -antiplatelet therapy: N/a 3. Pain Management: Continue oxycodone prn. .  --will schedule Zanaflex to 2 mg TID  --Continue Gabapentin 600 mg TID (PTA) 4. Mood: LCSW to follow for evaluation and support.   -antipsychotic agents: N/A 5. Neuropsych: This  patient is capable of making decisions on his own behalf. 6. Spinal incision:  Monitor wound for healing. Routine pressure relief measures.   --start MVI as well as juven to help promote wound healing.   -given bogginess and drainage will start doxycycline and request NSGY to follow.  7. Fluids/Electrolytes/Nutrition: Monitor I/O--encourage fluid intake. 8. HTN; Did have issues with significant hypotension post op--continue to hold BP meds for now. --will order orthostatic vitals.  9. T2DM: Hgb A1C-9.0. Add CM restrictions. Renal status stable--continue to encourage fluid intake.    ---Monitor BS ac/hs with SSI for elevated BS.   --discontinue levemir and resume metformin.   CBG up to 243: continue to titrate up metformin as tolerated 10. Acute blood loss anemia: Monitor for signs of bleeding. Recheck CBC on 10/10 11. Acute on chronic CKD?: Will recheck CMET on 10/10.  12. OSA: Continue CPAP at nights.  13.  Elevated CK: Question rhabdo due to statin. Continue to hold Crestor and recheck CK on 01/21/21. 14.  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   I have personally performed a face to face diagnostic evaluation, including, but not limited to relevant history and physical exam findings, of this patient and developed relevant assessment and plan.  Additionally, I have reviewed and concur with the physician assistant's documentation above.  Leeroy Cha, MD  Bary Leriche, PA-C

## 2021-01-18 NOTE — Discharge Summary (Signed)
Physician Discharge Summary  Patient ID: [name] MRN: [id number] DOB/AGE: [birth year] 55 y.o.  Admit date: 01/11/2021 Discharge date: 01/18/2021  Admission Diagnoses: lumbar spondylosis with stenosis   Discharge Diagnoses: same   Discharged Condition: stable  Hospital Course: The patient was admitted on 01/11/2021 and taken to the operating room where the patient underwent 5 level lumbar fusion. The patient tolerated the procedure well and was taken to the recovery room and then to the floor in stable condition. The hospital course was routine. There were no complications. The wound remained clean dry and intact. Pt had appropriate back soreness. No complaints of leg pain or new N/T/W. The patient remained afebrile with stable vital signs, and tolerated a regular diet. The patient continued to increase activities, and pain was well controlled with oral pain medications.   Consults: rehabilitation medicine  Significant Diagnostic Studies:  Results for orders placed or performed during the hospital encounter of 01/11/21  Glucose, capillary  Result Value Ref Range   Glucose-Capillary 152 (H) 70 - 99 mg/dL  Glucose, capillary  Result Value Ref Range   Glucose-Capillary 213 (H) 70 - 99 mg/dL  CBC  Result Value Ref Range   WBC 24.0 (H) 4.0 - 10.5 K/uL   RBC 4.42 4.22 - 5.81 MIL/uL   Hemoglobin 13.7 13.0 - 17.0 g/dL   HCT 39.7 39.0 - 52.0 %   MCV 89.8 80.0 - 100.0 fL   MCH 31.0 26.0 - 34.0 pg   MCHC 34.5 30.0 - 36.0 g/dL   RDW 12.5 11.5 - 15.5 %   Platelets 296 150 - 400 K/uL   nRBC 0.0 0.0 - 0.2 %  CBC  Result Value Ref Range   WBC 17.7 (H) 4.0 - 10.5 K/uL   RBC 4.07 (L) 4.22 - 5.81 MIL/uL   Hemoglobin 12.7 (L) 13.0 - 17.0 g/dL   HCT 36.5 (L) 39.0 - 52.0 %   MCV 89.7 80.0 - 100.0 fL   MCH 31.2 26.0 - 34.0 pg   MCHC 34.8 30.0 - 36.0 g/dL   RDW 12.7 11.5 - 15.5 %   Platelets 238 150 - 400 K/uL   nRBC 0.0 0.0 - 0.2 %  Renal function panel  Result Value Ref Range   Sodium  131 (L) 135 - 145 mmol/L   Potassium 4.4 3.5 - 5.1 mmol/L   Chloride 100 98 - 111 mmol/L   CO2 21 (L) 22 - 32 mmol/L   Glucose, Bld 284 (H) 70 - 99 mg/dL   BUN 20 6 - 20 mg/dL   Creatinine, Ser 1.21 0.61 - 1.24 mg/dL   Calcium 7.5 (L) 8.9 - 10.3 mg/dL   Phosphorus 3.1 2.5 - 4.6 mg/dL   Albumin 3.3 (L) 3.5 - 5.0 g/dL   GFR, Estimated >60 >60 mL/min   Anion gap 10 5 - 15  Glucose, capillary  Result Value Ref Range   Glucose-Capillary 283 (H) 70 - 99 mg/dL  Glucose, capillary  Result Value Ref Range   Glucose-Capillary 257 (H) 70 - 99 mg/dL  Glucose, capillary  Result Value Ref Range   Glucose-Capillary 262 (H) 70 - 99 mg/dL  Glucose, capillary  Result Value Ref Range   Glucose-Capillary 256 (H) 70 - 99 mg/dL   Comment 1 Notify RN    Comment 2 Document in Chart   Glucose, capillary  Result Value Ref Range   Glucose-Capillary 196 (H) 70 - 99 mg/dL  CBC  Result Value Ref Range   WBC 9.7 4.0 -  10.5 K/uL   RBC 3.09 (L) 4.22 - 5.81 MIL/uL   Hemoglobin 9.8 (L) 13.0 - 17.0 g/dL   HCT 28.5 (L) 39.0 - 52.0 %   MCV 92.2 80.0 - 100.0 fL   MCH 31.7 26.0 - 34.0 pg   MCHC 34.4 30.0 - 36.0 g/dL   RDW 13.2 11.5 - 15.5 %   Platelets 154 150 - 400 K/uL   nRBC 0.0 0.0 - 0.2 %  Glucose, capillary  Result Value Ref Range   Glucose-Capillary 230 (H) 70 - 99 mg/dL  Glucose, capillary  Result Value Ref Range   Glucose-Capillary 219 (H) 70 - 99 mg/dL  Lactic acid, plasma  Result Value Ref Range   Lactic Acid, Venous 2.3 (HH) 0.5 - 1.9 mmol/L  CK total and CKMB (cardiac)not at [facility]  Result Value Ref Range   Total CK 2,972 (H) 49 - 397 U/L   CK, MB 2.6 0.5 - 5.0 ng/mL   Relative Index 0.1 0.0 - 2.5  Basic metabolic panel  Result Value Ref Range   Sodium 130 (L) 135 - 145 mmol/L   Potassium 4.2 3.5 - 5.1 mmol/L   Chloride 101 98 - 111 mmol/L   CO2 18 (L) 22 - 32 mmol/L   Glucose, Bld 200 (H) 70 - 99 mg/dL   BUN 30 (H) 6 - 20 mg/dL   Creatinine, Ser 2.09 (H) 0.61 - 1.24 mg/dL    Calcium 7.2 (L) 8.9 - 10.3 mg/dL   GFR, Estimated 36 (L) >60 mL/min   Anion gap 11 5 - 15  Magnesium  Result Value Ref Range   Magnesium 1.5 (L) 1.7 - 2.4 mg/dL  Phosphorus  Result Value Ref Range   Phosphorus 2.9 2.5 - 4.6 mg/dL  Calcium, ionized  Result Value Ref Range   Calcium, Ionized, Serum 4.1 (L) 4.5 - 5.6 mg/dL  Basic metabolic panel  Result Value Ref Range   Sodium 128 (L) 135 - 145 mmol/L   Potassium 3.8 3.5 - 5.1 mmol/L   Chloride 101 98 - 111 mmol/L   CO2 19 (L) 22 - 32 mmol/L   Glucose, Bld 168 (H) 70 - 99 mg/dL   BUN 29 (H) 6 - 20 mg/dL   Creatinine, Ser 1.88 (H) 0.61 - 1.24 mg/dL   Calcium 6.9 (L) 8.9 - 10.3 mg/dL   GFR, Estimated 41 (L) >60 mL/min   Anion gap 8 5 - 15  Magnesium  Result Value Ref Range   Magnesium 1.5 (L) 1.7 - 2.4 mg/dL  CK  Result Value Ref Range   Total CK 2,799 (H) 49 - 397 U/L  Osmolality, urine  Result Value Ref Range   Osmolality, Ur 288 (L) 300 - 900 mOsm/kg  Glucose, capillary  Result Value Ref Range   Glucose-Capillary 179 (H) 70 - 99 mg/dL  CBC  Result Value Ref Range   WBC 8.9 4.0 - 10.5 K/uL   RBC 2.53 (L) 4.22 - 5.81 MIL/uL   Hemoglobin 7.9 (L) 13.0 - 17.0 g/dL   HCT 23.6 (L) 39.0 - 52.0 %   MCV 93.3 80.0 - 100.0 fL   MCH 31.2 26.0 - 34.0 pg   MCHC 33.5 30.0 - 36.0 g/dL   RDW 13.2 11.5 - 15.5 %   Platelets 145 (L) 150 - 400 K/uL   nRBC 0.0 0.0 - 0.2 %  Osmolality  Result Value Ref Range   Osmolality 284 275 - 295 mOsm/kg  Hemoglobin and hematocrit, blood  Result Value Ref Range  Hemoglobin 8.3 (L) 13.0 - 17.0 g/dL   HCT 23.9 (L) 39.0 - 52.0 %  Hemoglobin and hematocrit, blood  Result Value Ref Range   Hemoglobin 8.7 (L) 13.0 - 17.0 g/dL   HCT 26.3 (L) 39.0 - 52.0 %  Hemoglobin and hematocrit, blood  Result Value Ref Range   Hemoglobin 8.3 (L) 13.0 - 17.0 g/dL   HCT 24.4 (L) 39.0 - 52.0 %  Glucose, capillary  Result Value Ref Range   Glucose-Capillary 157 (H) 70 - 99 mg/dL  Glucose, capillary  Result  Value Ref Range   Glucose-Capillary 245 (H) 70 - 99 mg/dL  CK  Result Value Ref Range   Total CK 1,970 (H) 49 - 397 U/L  Basic metabolic panel  Result Value Ref Range   Sodium 131 (L) 135 - 145 mmol/L   Potassium 3.4 (L) 3.5 - 5.1 mmol/L   Chloride 101 98 - 111 mmol/L   CO2 24 22 - 32 mmol/L   Glucose, Bld 224 (H) 70 - 99 mg/dL   BUN 13 6 - 20 mg/dL   Creatinine, Ser 0.98 0.61 - 1.24 mg/dL   Calcium 7.7 (L) 8.9 - 10.3 mg/dL   GFR, Estimated >60 >60 mL/min   Anion gap 6 5 - 15  Glucose, capillary  Result Value Ref Range   Glucose-Capillary 274 (H) 70 - 99 mg/dL  Hemoglobin and hematocrit, blood  Result Value Ref Range   Hemoglobin 7.8 (L) 13.0 - 17.0 g/dL   HCT 23.4 (L) 39.0 - 52.0 %  Glucose, capillary  Result Value Ref Range   Glucose-Capillary 268 (H) 70 - 99 mg/dL   Comment 1 Notify RN    Comment 2 Document in Chart   Hemoglobin and hematocrit, blood  Result Value Ref Range   Hemoglobin 8.7 (L) 13.0 - 17.0 g/dL   HCT 25.2 (L) 39.0 - 52.0 %  Hemoglobin and hematocrit, blood  Result Value Ref Range   Hemoglobin 8.8 (L) 13.0 - 17.0 g/dL   HCT 25.8 (L) 39.0 - 52.0 %  Glucose, capillary  Result Value Ref Range   Glucose-Capillary 257 (H) 70 - 99 mg/dL   Comment 1 Notify RN    Comment 2 Document in Chart   Glucose, capillary  Result Value Ref Range   Glucose-Capillary 256 (H) 70 - 99 mg/dL  Glucose, capillary  Result Value Ref Range   Glucose-Capillary 177 (H) 70 - 99 mg/dL  Hemoglobin and hematocrit, blood  Result Value Ref Range   Hemoglobin 8.9 (L) 13.0 - 17.0 g/dL   HCT 25.6 (L) 39.0 - 52.0 %  Glucose, capillary  Result Value Ref Range   Glucose-Capillary 328 (H) 70 - 99 mg/dL   Comment 1 Notify RN    Comment 2 Document in Chart   Hemoglobin and hematocrit, blood  Result Value Ref Range   Hemoglobin 9.4 (L) 13.0 - 17.0 g/dL   HCT 27.7 (L) 39.0 - 52.0 %  Hemoglobin and hematocrit, blood  Result Value Ref Range   Hemoglobin 8.6 (L) 13.0 - 17.0 g/dL   HCT  25.7 (L) 39.0 - 52.0 %  Glucose, capillary  Result Value Ref Range   Glucose-Capillary 198 (H) 70 - 99 mg/dL   Comment 1 Notify RN    Comment 2 Document in Chart   Glucose, capillary  Result Value Ref Range   Glucose-Capillary 276 (H) 70 - 99 mg/dL  Glucose, capillary  Result Value Ref Range   Glucose-Capillary 275 (H) 70 - 99 mg/dL  Hemoglobin and hematocrit, blood  Result Value Ref Range   Hemoglobin 8.9 (L) 13.0 - 17.0 g/dL   HCT 26.3 (L) 39.0 - 52.0 %  Glucose, capillary  Result Value Ref Range   Glucose-Capillary 322 (H) 70 - 99 mg/dL   Comment 1 Notify RN    Comment 2 Document in Chart   Hemoglobin and hematocrit, blood  Result Value Ref Range   Hemoglobin 9.2 (L) 13.0 - 17.0 g/dL   HCT 26.6 (L) 39.0 - 52.0 %  Hemoglobin and hematocrit, blood  Result Value Ref Range   Hemoglobin 8.5 (L) 13.0 - 17.0 g/dL   HCT 24.6 (L) 39.0 - 52.0 %  Glucose, capillary  Result Value Ref Range   Glucose-Capillary 193 (H) 70 - 99 mg/dL   Comment 1 Notify RN    Comment 2 Document in Chart   Glucose, capillary  Result Value Ref Range   Glucose-Capillary 326 (H) 70 - 99 mg/dL  Glucose, capillary  Result Value Ref Range   Glucose-Capillary 186 (H) 70 - 99 mg/dL  Hemoglobin and hematocrit, blood  Result Value Ref Range   Hemoglobin 9.0 (L) 13.0 - 17.0 g/dL   HCT 25.8 (L) 39.0 - 52.0 %  Glucose, capillary  Result Value Ref Range   Glucose-Capillary 266 (H) 70 - 99 mg/dL  Glucose, capillary  Result Value Ref Range   Glucose-Capillary 202 (H) 70 - 99 mg/dL  ABO/Rh  Result Value Ref Range   ABO/RH(D)      O POS Performed at [facility], [address], [place], [place] [address]   Troponin I (High Sensitivity)  Result Value Ref Range   Troponin I (High Sensitivity) 13 <18 ng/L    DG Lumbar Spine 2-3 Views  Result Date: 01/11/2021 CLINICAL DATA:  L1-S1 posterior fusion. EXAM: LUMBAR SPINE - 2-3 VIEW COMPARISON:  CT lumbar spine 01/11/2021. FINDINGS: Two intraoperative  fluoroscopic views of the lumbar spine during L1-S1 posterior fusion. Fluoroscopy time 2 minutes 22 seconds. IMPRESSION: Intraoperative fluoroscopy as above. Electronically Signed   By: Ronney Asters M.D.   On: 01/11/2021 19:19   CT LUMBAR SPINE WO CONTRAST  Result Date: 01/11/2021 CLINICAL DATA:  Low back pain, preoperative evaluation EXAM: CT LUMBAR SPINE WITHOUT CONTRAST TECHNIQUE: Multidetector CT imaging of the lumbar spine was performed without intravenous contrast administration. Multiplanar CT image reconstructions were also generated. COMPARISON:  October 19, 2020 FINDINGS: Segmentation: 5 lumbar type vertebrae. Alignment: Stable with dextrocurvature and mild multilevel degenerative listhesis. Vertebrae: Stable vertebral body heights. Paraspinal and other soft tissues: No new abnormality. Disc levels: Congenital canal narrowing. Disc space narrowing with vacuum phenomenon at all lumbar levels. Disc bulges are present with prominent foraminal components and endplate osteophytic ridging. There is multilevel facet arthropathy with hypertrophic changes and ligamentum flavum thickening. Prominence of epidural fat particularly at lower lumbar levels. Resulting canal and foraminal stenosis do not appear substantially changed. 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 Electronically Signed   By: Macy Mis M.D.   On: 01/11/2021 07:31   DG C-Arm 1-60 Min-No Report  Result Date: 01/11/2021 Fluoroscopy was utilized by the requesting physician.  No radiographic interpretation.   DG C-Arm 1-60 Min-No Report  Result Date: 01/11/2021 Fluoroscopy was utilized by the requesting physician.  No radiographic interpretation.   DG C-Arm 1-60 Min-No Report  Result Date: 01/11/2021 Fluoroscopy was utilized by the requesting physician.  No radiographic interpretation.   DG C-Arm 1-60  Min-No Report  Result  Date: 01/11/2021 Fluoroscopy was utilized by the requesting physician.  No radiographic interpretation.   DG C-Arm 1-60 Min-No Report  Result Date: 01/11/2021 Fluoroscopy was utilized by the requesting physician.  No radiographic interpretation.   DG C-Arm 1-60 Min-No Report  Result Date: 01/11/2021 Fluoroscopy was utilized by the requesting physician.  No radiographic interpretation.   DG C-Arm 1-60 Min-No Report  Result Date: 01/11/2021 Fluoroscopy was utilized by the requesting physician.  No radiographic interpretation.   DG C-Arm 1-60 Min-No Report  Result Date: 01/11/2021 Fluoroscopy was utilized by the requesting physician.  No radiographic interpretation.    Antibiotics:  Anti-infectives (From admission, onward)    Start     Dose/Rate Route Frequency Ordered Stop   01/11/21 2200  ceFAZolin (ANCEF) IVPB 2g/100 mL premix        2 g 200 mL/hr over 30 Minutes Intravenous Every 8 hours 01/11/21 2102 01/12/21 0635   01/11/21 0600  vancomycin (VANCOREADY) IVPB 1500 mg/300 mL        1,500 mg 150 mL/hr over 120 Minutes Intravenous On call to O.R. 01/10/21 1422 01/11/21 1843       Discharge Exam: Blood pressure (!) 156/70, pulse (!) 105, temperature 99.2 F (37.3 C), temperature source Oral, resp. rate 17, height 6' 2" (1.88 m), weight (!) 142 kg, SpO2 96 %. Neurologic: Grossly normal Incision cdi  Discharge Medications:   Allergies as of 01/18/2021       Reactions   Penicillins Other (See Comments)   REACTION: Blister and Sores in the mouth        Medication List     STOP taking these medications    etodolac 500 MG tablet Commonly known as: LODINE   methocarbamol 500 MG tablet Commonly known as: Robaxin       TAKE these medications    aspirin 81 MG tablet Take 81 mg by mouth daily.   blood glucose meter kit and supplies Use up to four times daily as directed. Dx: E11.9   fluticasone 50 MCG/ACT nasal spray Commonly known as: FLONASE Place 2 sprays  into both nostrils daily. What changed: when to take this   gabapentin 600 MG tablet Commonly known as: Neurontin Take 1 tablet (600 mg total) by mouth 3 (three) times daily.   losartan-hydrochlorothiazide 100-12.5 MG tablet Commonly known as: HYZAAR TAKE 1 TABLET BY MOUTH EVERY DAY   metFORMIN 1000 MG tablet Commonly known as: GLUCOPHAGE Take 1 tablet (1,000 mg total) by mouth 2 (two) times daily with a meal.   NALTREXONE HCL PO Take 4 mg by mouth daily.   OneTouch Delica Lancets 16X Misc Test blood sugar twice a day.  Dx code: E11.9   OneTouch Verio Flex System w/Device Kit Test blood sugar twice a day.  Dx code: E11.9   OneTouch Verio test strip Generic drug: glucose blood Check blood sugars twice daily   Oxycodone HCl 10 MG Tabs Take 1 tablet (10 mg total) by mouth every 4 (four) hours as needed for severe pain ((score 7 to 10)).   rosuvastatin 20 MG tablet Commonly known as: CRESTOR Take 1 tablet (20 mg total) by mouth daily.   tadalafil 20 MG tablet Commonly known as: CIALIS TAKE 1/2 TO 1 TABLET BY MOUTH DAILY AS NEEDED FOR ERECTILE DYSFUNCTION   tiZANidine 4 MG tablet Commonly known as: ZANAFLEX Take 1 tablet (4 mg total) by mouth every 6 (six) hours as needed for muscle spasms.  Disposition: CIR   Final Dx: lumbar fusion  Discharge Instructions     Call MD for:  difficulty breathing, headache or visual disturbances   Complete by: As directed    Call MD for:  persistant nausea and vomiting   Complete by: As directed    Call MD for:  redness, tenderness, or signs of infection (pain, swelling, redness, odor or green/yellow discharge around incision site)   Complete by: As directed    Call MD for:  severe uncontrolled pain   Complete by: As directed    Call MD for:  temperature >100.4   Complete by: As directed    Diet - low sodium heart healthy   Complete by: As directed    Increase activity slowly   Complete by: As directed    No wound  care   Complete by: As directed         Follow-up Information     Judith Part, MD. Schedule an appointment as soon as possible for a visit in 2 week(s).   Specialty: Neurosurgery Contact information: [place] [address] [phone]                  Signed: Eustace Moore 01/18/2021, 10:18 AM

## 2021-01-18 NOTE — Progress Notes (Signed)
Discussed patient's wound drainage with Dr. Herminio Heads start patient on doxycycline for wound prophylaxis.

## 2021-01-18 NOTE — Progress Notes (Signed)
INPATIENT REHABILITATION ADMISSION NOTE   Arrival Method: bed     Mental Orientation: A&O x4   Assessment: See Flowsheet   Skin: Assessed, see flowsheet   IV'S: right forearm   Pain: see flowsheet   Tubes and Drains: Foley   Safety Measures: bed alarm activated   Vital Signs: see flowsheet   Height and Weight: see flowsheet   Rehab Orientation: Oriented to rehab process, schedule, safety plan.   Family: Wife [name] present    Notes: Patient ID: [name], male   DOB: 01-28-1966, 55 y.o.   MRN: [id number]  Met with patient in room, introduced myself and explained my role in his care. Oriented to rehab process, schedule, and safety plan. Went over medications with PA present. Skin assessed with patient's nurse. Allowed patient's wife to see progress with incision. Incision has moderate drainage and PA placed crossing steri-strips over incision and covered with ABD pad and tape. Observed blister above incision and place non-adherent dressing and covered with foam. Patient verbalized understanding of all items discussed. Will continue to monitor patient progress.  Dorthula Nettles, RN3, BSN, CBIS, [place], [facility], Inpatient Rehabilitation Office [phone] Cell [phone]

## 2021-01-18 NOTE — TOC Transition Note (Signed)
Transition of Care [facility]) - CM/SW Discharge Note   Patient Details  Name: [name] MRN: [id number] Date of Birth: 1965/05/15  Transition of Care [facility]) CM/SW Contact:  Pollie Friar, RN Phone Number: 01/18/2021, 10:58 AM   Clinical Narrative:    Patient discharging to CIR today. CM signing off.   Final next level of care: IP Rehab Facility Barriers to Discharge: No Barriers Identified   Patient Goals and CMS Choice     Choice offered to / list presented to : Patient  Discharge Placement                       Discharge Plan and Services                                     Social Determinants of Health (SDOH) Interventions     Readmission Risk Interventions No flowsheet data found.

## 2021-01-18 NOTE — Progress Notes (Signed)
Inpatient Rehabilitation Medication Review by a Pharmacist  A complete drug regimen review was completed for this patient to identify any potential clinically significant medication issues.  High Risk Drug Classes Is patient taking? Indication by Medication  Antipsychotic Yes Compazine for N/V  Anticoagulant Yes DVT px  Antibiotic No   Opioid Yes Oxycodone PRN for pain  Antiplatelet No   Hypoglycemics/insulin Yes SSI/metformin for DM  Vasoactive Medication No   Chemotherapy No   Other Yes Tinazidine - spasms     Type of Medication Issue Identified Description of Issue Recommendation(s)  Drug Interaction(s) (clinically significant)     Duplicate Therapy     Allergy     No Medication Administration End Date     Incorrect Dose     Additional Drug Therapy Needed  ASA, flonase, losartan-hydrochlorothiazide, rosuvastatin  Hold per Pam Love  Significant med changes from prior encounter (inform family/care partners about these prior to discharge).    Other       Clinically significant medication issues were identified that warrant physician communication and completion of prescribed/recommended actions by midnight of the next day:  Yes  Name of provider notified for urgent issues identified: Algis Liming, PA  Provider Method of Notification: Secure chat    Pharmacist comments:   Time spent performing this drug regimen review (minutes):  [id number] PharmD, [place], AAHIVP, CPP Infectious Disease Pharmacist 01/18/2021 12:40 PM

## 2021-01-18 NOTE — Progress Notes (Signed)
Patient transferred to 4W 16. Report called to [facility], LPN. Patient A&Ox4 at time of transfer and stable.

## 2021-01-18 NOTE — Progress Notes (Signed)
Christian Ribas, MD   Physician  Physical Medicine and Rehabilitation  PMR Pre-admission     Signed  Date of Service:  01/17/2021  2:53 PM       Related encounter: Admission (Discharged) from 01/11/2021 in [facility]       Signed      Show:Clear all '[x]' Written'[x]' Templated'[]' Copied  Added by: '[x]' Cristina Gong, RN'[x]' Ranell Patrick Clide Deutscher, MD  '[]' Hover for details                                                                                                                                                                                                                                                                                                                                                                                                                               PMR Admission Coordinator Pre-Admission Assessment   Patient: [name] is an 55 y.o., male MRN: [id number] DOB: 11/05/1965 Height: '6\' 2"'  (188 cm) Weight: (!) 142 kg   Insurance Information HMO:     PPO:      PCP:      IPA:      80/20:      OTHER:  PRIMARY: [employer] Medicare      Policy#: [id number]      Subscriber: pt CM Name: Jackelyn Poling      Phone#: [phone]     Fax#: [phone] Pre-Cert#: [id number] approved for 7 days after peer to peer appeal  with f/u with Enis Slipper at phone [phone] ext [phone] fax [phone]   Employer:  Benefits:  Phone #: [phone]     Name: 01/17/21 Eff. Date: 04/14/2020     Deduct: none      Out of Pocket Max: $5000      Life Max: none CIR: $250 co pay per admit then insurance covers 80%       SNF: $250 co pay per admit thern 80%; 100 days Outpatient: 80%     Co-Pay: 100 combined visits Home Health: first 15 visits no charge then visits 16 until 100 80%.       Co-Pay: 100 combined visits DME: 80%     Co-Pay:  20% Providers: in network  SECONDARY: none      Financial Counselor:       Phone#:    [employer], [employer]" for patients in Inpatient Rehabilitation Facilities with attached "Privacy Act [place] Records" was provided and verbally reviewed with: N/A   Emergency Contact Information Contact Information       Name Relation Home Work [place] Spouse [phone]   [phone]    Riaz, Onorato     [phone]           Current Medical History  Patient Admitting Diagnosis: Lumbar stenosis and radiculopathy   History of Present Illness: 55 year old male with medical history of DM2, DDD, HLD, HTN, obesity and OSA on CPAP, presented on 01/11/2021 for planned surgery . Has had complaints of low back pain and LE radicular pain secondary to severe canal stenosis and severe diffuse spondylosis. He underwent a L1-S1 decompression and instrumental fusion on 01/11/21.    Postoperative with hypotension issues. PCCM consulted after rapid response was called on 10/2. Patient given IVF bolus and foley cath was placed. Noticeable Hgb drop from 12.7 to 9.8.Holding home antihypertensives and began IVF maintenance per PCCM recommendation.s Monitors Hgb trends. AKI with Crest elevation. Avoid nephrotoxins meds. Continue SSI and stop metformin. Postoperative incisional oozing monitoring with dressing changes.   Patient's medical record from [facility]  has been reviewed by the rehabilitation admission coordinator and physician.   Past Medical History      Past Medical History:  Diagnosis Date   DDD (degenerative disc disease), lumbar     Diabetes mellitus     Hyperlipemia     Hypertension     Obesity     OSA (obstructive sleep apnea)      on CPAP    Has the patient had major surgery during 100 days prior to admission? Yes   Family History   family history includes Diabetes in his mother.   Current Medications   Current  Facility-Administered Medications:    0.9 %  sodium chloride infusion, 250 mL, Intravenous, Continuous, Ostergard, Joyice Faster, MD   acetaminophen (TYLENOL) tablet 650 mg, 650 mg, Oral, Q4H PRN, 650 mg at 01/13/21 1230 **OR** acetaminophen (TYLENOL) suppository 650 mg, 650 mg, Rectal, Q4H PRN, Ostergard, Joyice Faster, MD   Chlorhexidine Gluconate Cloth 2 % PADS 6 each, 6 each, Topical, Daily, Ostergard, Joyice Faster, MD, 6 each at 01/17/21 1100   docusate sodium (COLACE) capsule 100 mg, 100 mg, Oral, BID, Ostergard, Thomas A, MD, 100 mg at 01/18/21 0913   fluticasone (FLONASE) 50 MCG/ACT nasal spray 2 spray, 2 spray, Each Nare, Daily, Judith Part, MD, 2 spray at 01/18/21 0921   gabapentin (NEURONTIN) tablet 600 mg, 600 mg, Oral, TID, Ostergard, Joyice Faster,  MD, 600 mg at 01/18/21 0913   heparin injection 5,000 Units, 5,000 Units, Subcutaneous, Q8H, Ostergard, Joyice Faster, MD, 5,000 Units at 01/18/21 0554   insulin aspart (novoLOG) injection 0-15 Units, 0-15 Units, Subcutaneous, TID AC & HS, Estill Cotta, NP, 5 Units at 01/18/21 0645   insulin detemir (LEVEMIR) injection 10 Units, 10 Units, Subcutaneous, Daily, Jacky Kindle, MD, 10 Units at 01/18/21 6333   menthol-cetylpyridinium (CEPACOL) lozenge 3 mg, 1 lozenge, Oral, PRN **OR** phenol (CHLORASEPTIC) mouth spray 1 spray, 1 spray, Mouth/Throat, PRN, Judith Part, MD   methocarbamol (ROBAXIN) tablet 500 mg, 500 mg, Oral, Q6H PRN, Judith Part, MD, 500 mg at 01/18/21 0432   morphine 4 MG/ML injection 4 mg, 4 mg, Intravenous, Q3H PRN, Judith Part, MD, 4 mg at 01/17/21 1224   ondansetron (ZOFRAN) tablet 4 mg, 4 mg, Oral, Q6H PRN **OR** ondansetron (ZOFRAN) injection 4 mg, 4 mg, Intravenous, Q6H PRN, Judith Part, MD, 4 mg at 01/13/21 1845   oxyCODONE (Oxy IR/ROXICODONE) immediate release tablet 10 mg, 10 mg, Oral, Q4H PRN, Judith Part, MD, 10 mg at 01/18/21 0930   oxyCODONE (Oxy IR/ROXICODONE) immediate release tablet 5  mg, 5 mg, Oral, Q4H PRN, Judith Part, MD, 5 mg at 01/14/21 2028   polyethylene glycol (MIRALAX / GLYCOLAX) packet 17 g, 17 g, Oral, Daily, Ostergard, Joyice Faster, MD, 17 g at 01/18/21 0914   simethicone (MYLICON) chewable tablet 80 mg, 80 mg, Oral, QID PRN, Viona Gilmore D, NP, 80 mg at 01/15/21 1340   sodium chloride flush (NS) 0.9 % injection 3 mL, 3 mL, Intravenous, Q12H, Ostergard, Thomas A, MD, 3 mL at 01/18/21 1026   sodium chloride flush (NS) 0.9 % injection 3 mL, 3 mL, Intravenous, PRN, Judith Part, MD   tamsulosin (FLOMAX) capsule 0.4 mg, 0.4 mg, Oral, Daily, Bergman, Meghan D, NP, 0.4 mg at 01/18/21 0913   tiZANidine (ZANAFLEX) tablet 4 mg, 4 mg, Oral, Q6H PRN, Judith Part, MD, 4 mg at 01/18/21 0930   Patients Current Diet:  Diet Order                  Diet - low sodium heart healthy             Diet regular Room service appropriate? Yes; Fluid consistency: Thin  Diet effective now                       Precautions / Restrictions Precautions Precautions: Fall, Back Precaution Booklet Issued: No Precaution Comments: pt able to recall 3/3 back precautions with increased time but needs reinforcement during log roll Spinal Brace: Lumbar corset, Applied in sitting position Other Brace: for comfort, donned seated Restrictions Weight Bearing Restrictions: No    Has the patient had 2 or more falls or a fall with injury in the past year? No   Prior Activity Level Community (5-7x/wk): Indpendent; not worked since May   Prior Functional Level Self Care: Did the patient need help bathing, dressing, using the toilet or eating? Independent   Indoor Mobility: Did the patient need assistance with walking from room to room (with or without device)? Independent   Stairs: Did the patient need assistance with internal or external stairs (with or without device)? Independent   Functional Cognition: Did the patient need help planning regular tasks such as  shopping or remembering to take medications? Independent   Patient Information Are you of Hispanic, Latino/a,or Spanish origin?: A. No,  not of Hispanic, Latino/a, or Spanish origin What is your race?: A. White Do you need or want an interpreter to communicate with a doctor or health care staff?: 0. No   Patient's Response To:  Health Literacy and Transportation Is the patient able to respond to health literacy and transportation needs?: Yes Health Literacy - How often do you need to have someone help you when you read instructions, pamphlets, or other written material from your doctor or pharmacy?: Never In the past 12 months, has lack of transportation kept you from medical appointments or from getting medications?: No In the past 12 months, has lack of transportation kept you from meetings, work, or from getting things needed for daily living?: No   Home Assistive Devices / [place] Devices/Equipment: Eyeglasses, CBG Meter, Blood pressure cuff Home Equipment: None   Prior Device Use: Indicate devices/aids used by the patient prior to current illness, exacerbation or injury? None of the above   Current Functional Level Cognition   Overall Cognitive Status: Within Functional Limits for tasks assessed Orientation Level: Oriented X4, Oriented to person, Oriented to place, Oriented to situation, Oriented to time General Comments: cooperative, mildly anxious PO:EUMP prior to mobility and needs mod cues for safe technique with transfers due to internal distraction    Extremity Assessment (includes Sensation/Coordination)   Upper Extremity Assessment: Overall WFL for tasks assessed  Lower Extremity Assessment: Defer to PT evaluation     ADLs   Overall ADL's : Needs assistance/impaired Eating/Feeding: Independent, Sitting Grooming: Set up, Sitting Upper Body Bathing: Min guard, Minimal assistance, Sitting Lower Body Bathing: Maximal assistance, +2 for physical assistance,  +2 for safety/equipment, Sitting/lateral leans, Sit to/from stand Upper Body Dressing : Min guard, Sitting Lower Body Dressing: Maximal assistance, +2 for physical assistance, +2 for safety/equipment, Sitting/lateral leans, Sit to/from stand, Adhering to back precautions Toilet Transfer: Minimal assistance, RW Toilet Transfer Details (indicate cue type and reason): min A for powering into standing and verbal cues for hand placement Toileting- Clothing Manipulation and Hygiene: Maximal assistance, +2 for physical assistance, +2 for safety/equipment, Sitting/lateral lean, Sit to/from stand Functional mobility during ADLs: Minimal assistance, Rolling walker General ADL Comments: pt limited by pain this session, some impulsivity with transfer due to pain     Mobility   Overal bed mobility: Needs Assistance Bed Mobility: Sit to Sidelying, Rolling Rolling: Min assist Sidelying to sit: Mod assist Sit to sidelying: Mod assist, +2 for safety/equipment General bed mobility comments: mod A for management of BLE and verbal cues for proper positioning, second person present for safety but not needing to physically assist at shoulders; pt needs heavy +2 assist with bed flat and transfer pad for posterior supine scooting toward HOB, attempting to assist with BLE     Transfers   Overall transfer level: Needs assistance Equipment used: Rolling walker (2 wheeled) Transfers: Sit to/from Stand, Stand Pivot Transfers Sit to Stand: Mod assist, +2 physical assistance Stand pivot transfers: Min assist, +2 physical assistance General transfer comment: pt needs visual/verbal cues for safe technique/using momentum strategy to rise and needing two attempts to rise from chair; pt attempting to pull up on RW initially despite cues to push from chair     Ambulation / Gait / Stairs / Wheelchair Mobility   Ambulation/Gait Ambulation/Gait assistance: Min assist, +2 physical assistance, +2 safety/equipment Gait Distance  (Feet): 6 Feet Assistive device: Rolling walker (2 wheeled) Gait Pattern/deviations: Decreased stride length, Trunk flexed, Wide base of support, Step-to pattern General Gait Details:  heavy BUE reliance on RW, small low steps, pt grimacing and c/o R hip pain with weight bearing; HR to 120's bpm with exertion; self-limiting distance due to lack of premedication (not due for another 30 mins) Gait velocity: dec Gait velocity interpretation: <1.31 ft/sec, indicative of household ambulator     Posture / Balance Dynamic Sitting Balance Sitting balance - Comments: Requires at least 1 UE support on the bed for support due to pain Balance Overall balance assessment: Needs assistance Sitting-balance support: Feet supported, Bilateral upper extremity supported Sitting balance-Leahy Scale: Fair Sitting balance - Comments: Requires at least 1 UE support on the bed for support due to pain Postural control: Posterior lean Standing balance support: Bilateral upper extremity supported, During functional activity Standing balance-Leahy Scale: Poor Standing balance comment: Reliant on B UE support on the RW     Special needs/care consideration Home CPAP    Previous Home Environment  Living Arrangements: Spouse/significant other, Children  Lives With: Spouse Available Help at Discharge: Family, Available 24 hours/day Type of Home: House Home Layout: One level Home Access: Stairs to enter Entrance Stairs-Rails: None Entrance Stairs-Number of Steps: 3 Bathroom Shower/Tub: Chiropodist: Handicapped height Bathroom Accessibility: Yes How Accessible: Accessible via walker [place]: No   Discharge Living Setting Plans for Discharge Living Setting: Patient's home, Lives with (comment) (wife) Type of Home at Discharge: House Discharge Home Layout: One level Discharge Home Access: Stairs to enter Entrance Stairs-Rails: None Entrance Stairs-Number of Steps: 3 Discharge  Bathroom Shower/Tub: Tub/shower unit Discharge Bathroom Toilet: Handicapped height Discharge Bathroom Accessibility: Yes How Accessible: Accessible via walker Does the patient have any problems obtaining your medications?: No   Social/Family/Support Systems Patient Roles: Spouse Contact Information: wife, [name] Anticipated Caregiver: wife Anticipated Ambulance person Information: wife Ability/Limitations of Caregiver: wife works at [employer] Availability: 24/7 Discharge Plan Discussed with Primary Caregiver: Yes Is Caregiver In Agreement with Plan?: Yes Does Caregiver/Family have Issues with Lodging/Transportation while Pt is in Rehab?: No   Goals Patient/Family Goal for Rehab: Mod I to supervision with PT and OT Expected length of stay: ELOS 10 to 14 days Pt/Family Agrees to Admission and willing to participate: Yes Program Orientation Provided & Reviewed with Pt/Caregiver Including Roles  & Responsibilities: Yes   Decrease burden of Care through IP rehab admission: n/a   Possible need for SNF placement upon discharge: not anticipated   Patient Condition: I have reviewed medical records from [facility] , spoken with CM, and patient and spouse. I met with patient at the bedside for inpatient rehabilitation assessment.  Patient will benefit from ongoing PT and OT, can actively participate in 3 hours of therapy a day 5 days of the week, and can make measurable gains during the admission.  Patient will also benefit from the coordinated team approach during an Inpatient Acute Rehabilitation admission.  The patient will receive intensive therapy as well as Rehabilitation physician, nursing, social worker, and care management interventions.  Due to bladder management, bowel management, safety, skin/wound care, disease management, medication administration, pain management, and patient education the patient requires 24 hour a day rehabilitation nursing.  The patient is  currently mod assist overall  with mobility and basic ADLs.  Discharge setting and therapy post discharge at home with home health is anticipated.  Patient has agreed to participate in the Acute Inpatient Rehabilitation Program and will admit today.   Preadmission Screen Completed By:  Cleatrice Burke, 01/18/2021 10:52 AM ______________________________________________________________________   Discussed  status with Dr. Ranell Patrick on 01/18/2021 at 1053 and received approval for admission today.   Admission Coordinator:  Cleatrice Burke, RN, time 7124 Date 01/18/2021    Assessment/Plan: Diagnosis: Lumbar radiculopathy Does the need for close, 24 hr/day Medical supervision in concert with the patient's rehab needs make it unreasonable for this patient to be served in a less intensive setting? Yes Co-Morbidities requiring supervision/potential complications: Urinary retention, severe muscle spasm, orthostasis, controlled type 2 diabetes mellitus, hyperlipidemia Due to bladder management, bowel management, safety, skin/wound care, disease management, medication administration, pain management, and patient education, does the patient require 24 hr/day rehab nursing? Yes Does the patient require coordinated care of a physician, rehab nurse, PT, OT to address physical and functional deficits in the context of the above medical diagnosis(es)? Yes Addressing deficits in the following areas: balance, endurance, locomotion, strength, transferring, bowel/bladder control, bathing, dressing, feeding, grooming, toileting, and psychosocial support Can the patient actively participate in an intensive therapy program of at least 3 hrs of therapy 5 days a week? Yes The potential for patient to make measurable gains while on inpatient rehab is excellent Anticipated functional outcomes upon discharge from inpatient rehab: modified independent PT, modified independent OT, independent SLP Estimated rehab length  of stay to reach the above functional goals is: 14 days Anticipated discharge destination: Home 10. Overall Rehab/Functional Prognosis: excellent     MD Signature: Leeroy Cha, MD         Revision History                     Note Details  Author Christian Ribas, MD File Time 01/18/2021 10:57 AM  Author Type Physician Status Signed  Last Editor Christian Ribas, MD Service Physical Medicine and [facility] # [id number] Admit Date 01/18/2021

## 2021-01-19 DIAGNOSIS — T8149XA Infection following a procedure, other surgical site, initial encounter: Secondary | ICD-10-CM

## 2021-01-19 DIAGNOSIS — E1169 Type 2 diabetes mellitus with other specified complication: Secondary | ICD-10-CM

## 2021-01-19 DIAGNOSIS — E669 Obesity, unspecified: Secondary | ICD-10-CM

## 2021-01-19 DIAGNOSIS — I10 Essential (primary) hypertension: Secondary | ICD-10-CM

## 2021-01-19 LAB — GLUCOSE, CAPILLARY
Glucose-Capillary: 242 mg/dL — ABNORMAL HIGH (ref 70–99)
Glucose-Capillary: 168 mg/dL — ABNORMAL HIGH (ref 70–99)
Glucose-Capillary: 140 mg/dL — ABNORMAL HIGH (ref 70–99)

## 2021-01-19 MED ORDER — CHLORHEXIDINE GLUCONATE CLOTH 2 % EX PADS
6.0000 | MEDICATED_PAD | Freq: Every day | CUTANEOUS | Status: DC
  Administered 2021-01-19 – 2021-01-25 (×7): 6 via TOPICAL

## 2021-01-19 NOTE — Progress Notes (Signed)
Orthopedic Tech Progress Note Patient Details:  [name] [birth year] [id number] LSO Brace extensions were applied to both sides of his back brace to ensure a better fit for this patient.  Patient ID: [name], male   DOB: 08-16-1965, 55 y.o.   MRN: [id number]  [name] 01/19/2021, 10:30 AM

## 2021-01-19 NOTE — Evaluation (Signed)
Physical Therapy Assessment and Plan  Patient Details  Name: [name] MRN: [id number] Date of Birth: 1965/11/03  PT Diagnosis: Difficulty walking, Low back pain, Muscle spasms, Pain in joint, and Pain in RLE Rehab Potential: Good ELOS: 10-14 days   Today's Date: 01/19/2021 PT Individual Time: 6759-1638 PT Individual Time Calculation (min): 71 min    Hospital Problem: Active Problems:   Lumbar radiculopathy   Past Medical History:  Past Medical History:  Diagnosis Date   DDD (degenerative disc disease), lumbar    Diabetes mellitus    Hyperlipemia    Hypertension    Obesity    OSA (obstructive sleep apnea)    on CPAP   Past Surgical History:  Past Surgical History:  Procedure Laterality Date   APPLICATION OF ROBOTIC ASSISTANCE FOR SPINAL PROCEDURE N/A 01/11/2021   Procedure: APPLICATION OF ROBOTIC ASSISTANCE FOR SPINAL PROCEDURE;  Surgeon: Judith Part, MD;  Location: [facility];  Service: Neurosurgery;  Laterality: N/A;   BACK SURGERY     COLONOSCOPY  2019   TRANSFORAMINAL LUMBAR INTERBODY FUSION (TLIF) WITH PEDICLE SCREW FIXATION 4 LEVEL N/A 01/11/2021   Procedure: Lumbar one-two, Lumbar two-three, Lumbar three-four, Lumbar four-five, Lumbar five Sacral one Open decompression, Transforaminal lumbar interbody fusion, posterolateral instrumented fusion;  Surgeon: Judith Part, MD;  Location: [facility];  Service: Neurosurgery;  Laterality: N/A;    Assessment & Plan Clinical Impression: Patient is a 55 y.o. male with history of DDD, T2DM, OSA, obesity, lumbar stenosis with RLE radiculopathy and significant pain limiting functional status for past 6 months. He was found have lumbar spondylosis with severe canal stenosis from L1/2 to L4/L5.  He was admitted on 01/11/21 for L1-S1 laminectomies with PLF by Dr. Zada Finders. Hospital course significant for hypotension as well as lethargy requiring fluid boluses and PCCM consulted for input. He was noted to have elevated CK at  2972, lactic acidosis,  low magnesium levels -1.5, intermittent tachycardia with HR in 120's and ABLA with drop in H/H to 7.9 as well as hyponatremia with Na-128. Hyponatremia felt to be dilutional from fluid boluses. CK trending down to 1970 on 10/04 and improvement noted in Na-131. Blood pressures improving and trending upwards but he continues to be limited by orthostatic symptoms as well as pain with activity. Has urinary retention therefore foley placed but also reports appetite poor as has not had BM for 7 days (since surgery)  He was started on levemir for labile BS on regular diet.  He continues to have issues with bleeding from incision, pain, neuropathy and spasticity affecting functional status. CIR recommended due to functional decline.   Patient transferred to CIR on 01/18/2021 .   Patient currently requires min assist with mobility secondary to  pain, decreased cardiorespiratoy endurance, unbalanced muscle activation, and decreased standing balance and decreased postural control.  Prior to hospitalization, patient was independent  with mobility and lived with Spouse in a House home.  Home access is 3Stairs to enter.  Patient will benefit from skilled PT intervention to maximize safe functional mobility, minimize fall risk, and decrease caregiver burden for planned discharge home with 24 hour assist.  Anticipate patient will benefit from follow up Independence vs OP therapy at discharge.  PT - End of Session Activity Tolerance: Tolerates 10 - 20 min activity with multiple rests;Tolerates 30+ min activity with multiple rests Endurance Deficit: Yes PT Assessment Rehab Potential (ACUTE/IP ONLY): Good PT Barriers to Discharge: [place] home environment;Decreased caregiver support;Home environment access/layout;Wound Care;Lack of/limited family support;Insurance for  SNF coverage;Weight;Medication compliance PT Barriers to Discharge Comments: Pain is pt's largest limiting factor in progress PT Patient  demonstrates impairments in the following area(s): Balance;Endurance;Motor;Pain;Safety;Sensory;Skin Integrity PT Transfers Functional Problem(s): Bed Mobility;Bed to Chair;Car;Furniture PT Locomotion Functional Problem(s): Ambulation;Stairs PT Plan PT Intensity: Minimum of 1-2 x/day ,45 to 90 minutes PT Frequency: 5 out of 7 days PT Duration Estimated Length of Stay: 10-14 days PT Treatment/Interventions: Ambulation/gait training;Community reintegration;DME/adaptive equipment instruction;Neuromuscular re-education;Psychosocial support;Stair training;UE/LE Strength taining/ROM;Wheelchair propulsion/positioning;UE/LE Coordination activities;Therapeutic Exercise;Patient/family education;Functional mobility training;Disease management/prevention;Cognitive remediation/compensation;Balance/vestibular training;Discharge planning;Functional electrical stimulation;Pain management;Skin care/wound management;Therapeutic Activities PT Transfers Anticipated Outcome(s): Mod I/ supervision PT Locomotion Anticipated Outcome(s): supervision PT Recommendation Recommendations for Other Services: Therapeutic Recreation consult Therapeutic Recreation Interventions: Stress management;Outing/community reintergration;Kitchen group;Pet therapy Follow Up Recommendations: Outpatient PT;Home health PT Patient destination: Home Equipment Recommended: To be determined   PT Evaluation Precautions/Restrictions Precautions Precautions: Fall;Back Required Braces or Orthoses: Spinal Brace Spinal Brace: Lumbar corset;Applied in sitting position Restrictions Weight Bearing Restrictions: No General   Vital Signs  Pain Pain Assessment Pain Scale: 0-10 Pain Score: 7  Pain Type: Acute pain;Surgical pain;Neuropathic pain Pain Location: Back Pain Orientation: Mid;Lower;Right Pain Descriptors / Indicators: Aching;Throbbing;Operative site guarding;Tender Pain Frequency: Constant Pain Onset: On-going Patients Stated Pain  Goal: 2 Pain Intervention(s): Repositioned Multiple Pain Sites: Yes 2nd Pain Site Pain Score: 6 Pain Type: Acute pain;Surgical pain;Neuropathic pain Pain Location: Leg Pain Orientation: Right;Upper;Medial Pain Radiating Towards: Radiates prox to distal and from lateral hip to medial thigh/ adductors Pain Descriptors / Indicators: Radiating;Aching;Tightness Pain Frequency: Constant Pain Onset: Gradual Patient's Stated Pain Goal: 1 Pain Intervention(s): Repositioned Pain Interference Pain Interference Pain Effect on Sleep: 3. Frequently Pain Interference with Therapy Activities: 3. Frequently;2. Occasionally Pain Interference with Day-to-Day Activities: 3. Frequently Home Living/Prior Functioning Home Living Available Help at Discharge: Family;Available 24 hours/day Type of Home: House Home Access: Stairs to enter [employer] of Steps: 3 Entrance Stairs-Rails: None Home Layout: One level Bathroom Shower/Tub: Chiropodist: Handicapped height Bathroom Accessibility: Yes  Lives With: Spouse Prior Function Level of Independence: Independent with basic ADLs;Independent with gait;Independent with transfers  Able to Take Stairs?: Yes Driving: Yes Vocation: On disability Comments: Have not worked since May 28th due to pain. Independent with ADL's. Driving. Community ambulator with a shopping cart. Vision/Perception  Vision - History Ability to See in Adequate Light: 0 Adequate Perception Perception: Within Functional Limits Praxis Praxis: Intact  Cognition Overall Cognitive Status: Within Functional Limits for tasks assessed Arousal/Alertness: Awake/alert Orientation Level: Oriented X4 Memory: Appears intact Awareness: Appears intact Problem Solving: Appears intact Safety/Judgment: Appears intact Sensation Sensation Light Touch: Appears Intact Coordination Gross Motor Movements are Fluid and Coordinated: Yes Fine Motor Movements are Fluid  and Coordinated: Yes Heel Shin Test: WFL to touch bilaterally but restricted in ROM at hips by body habitus for movement up/ down shins Motor  Motor Motor: Within Functional Limits Motor - Skilled Clinical Observations: RLE weakness only present with severe increases in pain otherwise [facility]   Trunk/Postural Assessment  Cervical Assessment Cervical Assessment: Exceptions to [facility] (forward head) Thoracic Assessment Thoracic Assessment: Exceptions to [facility] (rounded shoulders) Lumbar Assessment Lumbar Assessment: Exceptions to [facility] (posteriorly rotated pelvis with reduced lordosis) Postural Control Postural Control: Within Functional Limits  Balance Balance Balance Assessed: Yes Static Sitting Balance Static Sitting - Balance Support: Bilateral upper extremity supported;Feet supported Static Sitting - Level of Assistance: 5: Stand by assistance Dynamic Sitting Balance Dynamic Sitting - Balance Support: Bilateral upper extremity supported;Feet supported Dynamic Sitting - Level of Assistance: 4:  Min assist Sitting balance - Comments: Requires at least 1 UE support on bed for support d/t pain and CGA with cues for maintaining back precautions Static Standing Balance Static Standing - Balance Support: Bilateral upper extremity supported;During functional activity Static Standing - Level of Assistance: 4: Min assist (CGA) Dynamic Standing Balance Dynamic Standing - Balance Support: Bilateral upper extremity supported;During functional activity Dynamic Standing - Level of Assistance: 4: Min assist (CGA) Extremity Assessment      RLE Assessment RLE Assessment: Within Functional Limits General Strength Comments: WNL however demos severe decrease in strength with sharp increase in pain LLE Assessment LLE Assessment: Within Functional Limits General Strength Comments: WNL  Care Tool Care Tool Bed Mobility Roll left and right activity   Roll left and right assist level: Minimal Assistance -  Patient > 75%    Sit to lying activity   Sit to lying assist level: Minimal Assistance - Patient > 75%    Lying to sitting on side of bed activity   Lying to sitting on side of bed assist level: the ability to move from lying on the back to sitting on the side of the bed with no back support.: Minimal Assistance - Patient > 75%     Care Tool Transfers Sit to stand transfer   Sit to stand assist level: Minimal Assistance - Patient > 75%    Chair/bed transfer   Chair/bed transfer assist level: Minimal Assistance - Patient > 75%     Toilet transfer   Assist Level: Minimal Assistance - Patient > 75%    Car transfer Car transfer activity did not occur: Safety/medical concerns        Care Tool Locomotion Ambulation   Assist level: Minimal Assistance - Patient > 75% Assistive device: Walker-rolling Max distance: 5 ft  Walk 10 feet activity Walk 10 feet activity did not occur: Safety/medical concerns       Walk 50 feet with 2 turns activity Walk 50 feet with 2 turns activity did not occur: Safety/medical concerns      Walk 150 feet activity Walk 150 feet activity did not occur: Safety/medical concerns      Walk 10 feet on uneven surfaces activity Walk 10 feet on uneven surfaces activity did not occur: Safety/medical concerns      Stairs Stair activity did not occur: Safety/medical concerns        Walk up/down 1 step activity Walk up/down 1 step or curb (drop down) activity did not occur: Safety/medical concerns     Walk up/down 4 steps activity did not occuR: Safety/medical concerns  Walk up/down 4 steps activity      Walk up/down 12 steps activity Walk up/down 12 steps activity did not occur: Safety/medical concerns      Pick up small objects from floor Pick up small object from the floor (from standing position) activity did not occur: Safety/medical concerns      Wheelchair Is the patient using a wheelchair?: No Type of Wheelchair: Manual   Wheelchair assist  level: Dependent - Patient 0% Max wheelchair distance: 10 ft  Wheel 50 feet with 2 turns activity   Assist Level: Dependent - Patient 0%  Wheel 150 feet activity   Assist Level: Dependent - Patient 0%    Refer to Care Plan for Long Term Goals  SHORT TERM GOAL WEEK 1 PT Short Term Goal 1 (Week 1): Pt will perform bed mobility with CGA while adhering to back precautions. PT Short Term Goal 2 (Week 1): Pt  will perform standing transfers with consistent CGA. PT Short Term Goal 3 (Week 1): Pt will ambulate at least 30 feet consistently with CGA and LRAD. PT Short Term Goal 4 (Week 1): Pt will initiate stair negotiation training. PT Short Term Goal 5 (Week 1): Pt will correctly don lumbar corset with supervision and min cues.  Recommendations for other services: Therapeutic Recreation  Pet therapy, Kitchen group, Stress management, and Outing/community reintegration  Skilled Therapeutic Intervention Mobility Bed Mobility Bed Mobility: Rolling Left;Left Sidelying to Sit Rolling Left: Contact Guard/Touching assist Left Sidelying to Sit: Minimal Assistance - Patient >75% Transfers Transfers: Sit to Stand;Stand to Sit;Stand Pivot Transfers Sit to Stand: Minimal Assistance - Patient > 75%;Moderate Assistance - Patient 50-74% Stand to Sit: Minimal Assistance - Patient > 75% Stand Pivot Transfers: Minimal Assistance - Patient > 75%;Contact Guard/Touching assist Stand Pivot Transfer Details: Tactile cues for initiation;Verbal cues for technique;Verbal cues for precautions/safety Transfer (Assistive device): Rolling walker Locomotion  Gait Ambulation: Yes Gait Assistance: Contact Guard/Touching assist Gait Distance (Feet): 5 Feet Assistive device: Rolling walker Gait Gait: Yes Gait Pattern: Impaired Gait Pattern: Step-to pattern;Decreased step length - left;Decreased stance time - right;Wide base of support;Decreased hip/knee flexion - right;Decreased hip/knee flexion - left Gait velocity:  decreased  PT Evaluation completed; see above for results. PT educated patient in roles of PT vs OT, PT POC, rehab potential, rehab goals, and discharge recommendations along with recommendation for follow-up rehabilitation services. Individual treatment initiated:  Patient supine in bed upon PT arrival. Patient alert and agreeable to PT session. Relates 7/10 pain in lower R back, R quad, and R adductors during session.Has been pre-medicated. Although he has pain, he understands that he will need tomove through the pain to the best of his ability. Extensive education provided re: nature of nerve sensitivity, swelling around surgical site and time required for swelling to reduce, potential use of cryotherapy at surgical site for assist with pain mgmt, pt's need for changes in positioning throughout each day in order to maintain mobility and adjust for pain. Pt's pain is only in RLE and increases with movement and WB. Position with lowest pain levels is supine on his back. Pt is able to relate 3/3 of spinal precautions.   Therapeutic Activity: Bed Mobility: Patient performed supine to log roll to L side with Min A for positioning of RLE for improved use in order to maintain shoulder/ hips squaring off for reduced rotation at spine. With increased effort and pain, pt is able to push self from bed surface up to seated position on EOB. Provided verbal cues for technique to remain within precautions throughout. Lumbar corset donned in seated position. Pt allowed to don and is unable to correctly don as he is quick to want to pull the fine tuning tabs to secure brace. Educated with redonning by this PT on need to center back, then bring side panels around to secure tightly just below pannus to best of pt's ability. Related that d/t body habitus, most likely brace will need to be donned to this point, then have pt rise to stand where it can be readjusted for more snug fit prior to pt pulling on fine adjustment tabs  to secure.  Transfers: Patient performed sit <> stand at bedside to RW with MinA for power up from slightly elevated bed surface. Ambulatory transfer to w/c covering 5 feet using RW with MinA/ CGA. Tentative steps with slow pace noted. Provided vc/tc for positioning in order to reduce chances of rotation  of spine. Pt relates having ambulated with 2 people while with acute therapy and reaching distance greater than 20 feet. However he is limited this day with pain and relates that sharp pains will affect his strength and increase potential for RLE to "give out". Pt unwilling to attempt further ambulation this day d/t pain and lack of 2nd person assist. Once in w/c, pt relates wanting to stay in w/c at this time d/t reduction in pain, improved comfort and proximity to lunch. W/c appearing snug and attempt for locating 22" w/c unsuccessful, but will continue to locate for pt's improved comfort for transportation during rehab stay.   Patient seated upright  in w/c at end of session with brakes locked, belt alarm set, and all needs within reach.   Discharge Criteria: Patient will be discharged from PT if patient refuses treatment 3 consecutive times without medical reason, if treatment goals not met, if there is a change in medical status, if patient makes no progress towards goals or if patient is discharged from hospital.  The above assessment, treatment plan, treatment alternatives and goals were discussed and mutually agreed upon: by patient  Alger Simons PT, DPT 01/19/2021, 1:11 PM

## 2021-01-19 NOTE — Evaluation (Signed)
Occupational Therapy Assessment and Plan  Patient Details  Name: [name] MRN: [id number] Date of Birth: June 24, 1965  OT Diagnosis: abnormal posture, acute pain, muscle weakness (generalized), and spina precautions impairing ADL/IADL/functional mobility performance Rehab Potential: Rehab Potential (ACUTE ONLY): Good ELOS: 10 to 14 days   Today's Date: 01/19/2021 OT Individual Time: 6734-1937 OT Individual Time Calculation (min): 53 min   + 24 min, missed 15 min due to pain  Hospital Problem: Active Problems:   Lumbar radiculopathy   Past Medical History:  Past Medical History:  Diagnosis Date   DDD (degenerative disc disease), lumbar    Diabetes mellitus    Hyperlipemia    Hypertension    Obesity    OSA (obstructive sleep apnea)    on CPAP   Past Surgical History:  Past Surgical History:  Procedure Laterality Date   APPLICATION OF ROBOTIC ASSISTANCE FOR SPINAL PROCEDURE N/A 01/11/2021   Procedure: APPLICATION OF ROBOTIC ASSISTANCE FOR SPINAL PROCEDURE;  Surgeon: Judith Part, MD;  Location: [place];  Service: Neurosurgery;  Laterality: N/A;   BACK SURGERY     COLONOSCOPY  2019   TRANSFORAMINAL LUMBAR INTERBODY FUSION (TLIF) WITH PEDICLE SCREW FIXATION 4 LEVEL N/A 01/11/2021   Procedure: Lumbar one-two, Lumbar two-three, Lumbar three-four, Lumbar four-five, Lumbar five Sacral one Open decompression, Transforaminal lumbar interbody fusion, posterolateral instrumented fusion;  Surgeon: Judith Part, MD;  Location: [place];  Service: Neurosurgery;  Laterality: N/A;    Assessment & Plan Clinical Impression: Patient is a 55 y.o. year old male with history of DDD, T2DM, OSA, obesity, lumbar stenosis with RLE radiculopathy and significant pain limiting functional status for past 6 months. He was found have lumbar spondylosis with severe canal stenosis from L1/2 to L4/L5.  He was admitted on 01/11/21 for L1-S1 laminectomies with PLF by Dr. Zada Finders. Hospital course  significant for hypotension as well as lethargy requiring fluid boluses and PCCM consulted for input. He was noted to have elevated CK at 2972, lactic acidosis,  low magnesium levels -1.5, intermittent tachycardia with HR in 120's and ABLA with drop in H/H to 7.9 as well as hyponatremia with Na-128. Hyponatremia felt to be dilutional from fluid boluses. CK trending down to 1970 on 10/04 and improvement noted in Na-131. Blood pressures improving and trending upwards but he continues to be limited by orthostatic symptoms as well as pain with activity. Has urinary retention therefore foley placed but also reports appetite poor as has not had BM for 7 days (since surgery)  He was started on levemir for labile BS on regular diet.  He continues to have issues with bleeding from incision, pain, neuropathy and spasticity affecting functional status. CIR recommended due to functional decline.   Patient transferred to CIR on 01/18/2021 .    Patient currently requires mod with basic self-care skills and IADL secondary to muscle weakness, decreased cardiorespiratoy endurance, and decreased standing balance and acute pain and spinal precautions .  Prior to hospitalization, patient could complete BADL/IADL with independent .  Patient will benefit from skilled intervention to increase independence with basic self-care skills and increase level of independence with iADL prior to discharge home with care partner.  Anticipate patient will require intermittent supervision and follow up outpatient.  OT - End of Session Activity Tolerance: Tolerates 30+ min activity with multiple rests Endurance Deficit: Yes Endurance Deficit Description: Primarily limited by pain, requires to be seated to complete ADL OT Assessment Rehab Potential (ACUTE ONLY): Good OT Barriers to Discharge: Other (comments)  OT Barriers to Discharge Comments: pain OT Patient demonstrates impairments in the following area(s):  Pain;Balance;Motor;Endurance OT Basic ADL's Functional Problem(s): Grooming;Bathing;Dressing;Toileting OT Advanced ADL's Functional Problem(s): Simple Meal Preparation OT Transfers Functional Problem(s): Toilet;Tub/Shower OT Additional Impairment(s): None OT Plan OT Intensity: Minimum of 1-2 x/day, 45 to 90 minutes OT Frequency: 5 out of 7 days OT Duration/Estimated Length of Stay: 10 to 14 days OT Treatment/Interventions: Balance/vestibular training;Community reintegration;Disease mangement/prevention;Neuromuscular re-education;Patient/family education;Self Care/advanced ADL retraining;Therapeutic Exercise;UE/LE Coordination activities;Wheelchair propulsion/positioning;UE/LE Strength taining/ROM;Therapeutic Activities;Psychosocial support;Pain management;Functional mobility training;DME/adaptive equipment instruction;Discharge planning;Cognitive remediation/compensation OT Self Feeding Anticipated Outcome(s): ind OT Basic Self-Care Anticipated Outcome(s): mod I OT Toileting Anticipated Outcome(s): mod I OT Bathroom Transfers Anticipated Outcome(s): mod I toilet, S shower OT Recommendation Recommendations for Other Services: Therapeutic Recreation consult Therapeutic Recreation Interventions: Pet therapy;Kitchen group;Stress management Patient destination: Home Follow Up Recommendations: Outpatient OT Equipment Recommended: 3 in 1 bedside comode;Tub/shower bench   OT Evaluation Precautions/Restrictions  Precautions Precautions: Fall;Back Precaution Booklet Issued: No Precaution Comments: pt able to recall 3/3 back precautions with ind when prompted Required Braces or Orthoses: Spinal Brace Spinal Brace: Lumbar corset;Applied in sitting position Other Brace: for comfort, donned seated Restrictions Weight Bearing Restrictions: No General Chart Reviewed: Yes OT Amount of Missed Time: 15 Minutes PT Missed Treatment Reason: Pain Family/Caregiver Present: No Pain Pain  Assessment Pain Scale: 0-10 Pain Score: 7  Pain Type: Acute pain;Surgical pain;Neuropathic pain Pain Location: Back Pain Orientation: Mid;Lower;Right Pain Descriptors / Indicators: Aching;Throbbing;Operative site guarding;Tender Pain Frequency: Constant Pain Onset: On-going Patients Stated Pain Goal: 2 Pain Intervention(s): Repositioned Multiple Pain Sites: Yes 2nd Pain Site Pain Score: 6 Pain Type: Acute pain;Surgical pain;Neuropathic pain Pain Location: Leg Pain Orientation: Right;Upper;Medial Pain Radiating Towards: Radiates prox to distal and from lateral hip to medial thigh/ adductors Pain Descriptors / Indicators: Radiating;Aching;Tightness Pain Frequency: Constant Pain Onset: Gradual Patient's Stated Pain Goal: 1 Pain Intervention(s): Repositioned Home Living/Prior Functioning Home Living Family/patient expects to be discharged to:: Private residence Living Arrangements: Spouse/significant other, Children Available Help at Discharge: Family, Available 24 hours/day Type of Home: House Home Access: Stairs to enter Technical brewer of Steps: 3 Entrance Stairs-Rails: None Home Layout: One level Bathroom Shower/Tub: Chiropodist: Handicapped height Bathroom Accessibility: Yes  Lives With: Spouse, Daughter IADL History Homemaking Responsibilities: Yes Occupation: Other (comment) (Has not worked since May 28th due to pain.) Type of Occupation: Dealer Prior Function Level of Independence: Independent with basic ADLs, Independent with gait, Independent with transfers  Able to Take Stairs?: Yes Driving: Yes Vocation: On disability Comments: Have not worked since May 28th due to pain. Independent with ADL's. Driving. Community ambulator with a shopping cart. Vision Baseline Vision/History: 1 Wears glasses (readers) Ability to See in Adequate Light: 0 Adequate Patient Visual Report: No change from baseline Vision Assessment?: No apparent visual  deficits Perception  Perception: Within Functional Limits Praxis Praxis: Intact Cognition Overall Cognitive Status: Within Functional Limits for tasks assessed Arousal/Alertness: Awake/alert Orientation Level: Person;Place;Situation Person: Oriented Place: Oriented Situation: Oriented Year: 2022 Month: October Day of Week: Correct Memory: Appears intact Immediate Memory Recall: Sock;Blue;Bed Memory Recall Sock: Without Cue Memory Recall Blue: Without Cue Memory Recall Bed: Without Cue Awareness: Appears intact Problem Solving: Appears intact Safety/Judgment: Appears intact Sensation Sensation Light Touch: Appears Intact Hot/Cold: Appears Intact Proprioception: Appears Intact Stereognosis: Appears Intact Coordination Gross Motor Movements are Fluid and Coordinated: No Fine Motor Movements are Fluid and Coordinated: Yes Coordination and Movement Description: Overall slowed due to RLE/back pain Heel Shin Test: [facility] to touch  bilaterally but restricted in ROM at hips by body habitus for movement up/ down shins Motor  Motor Motor: Within Functional Limits;Other (comment) Motor - Skilled Clinical Observations: RLE weakness only present with severe increases in pain otherwise [facility]  Trunk/Postural Assessment  Cervical Assessment Cervical Assessment: Exceptions to [facility] (forward head) Thoracic Assessment Thoracic Assessment: Exceptions to [facility] (rounded shoulders) Lumbar Assessment Lumbar Assessment: Exceptions to [facility] (posteriorly rotated pelvis with reduced lordosis) Postural Control Postural Control: Within Functional Limits  Balance Balance Balance Assessed: Yes Static Sitting Balance Static Sitting - Balance Support: Bilateral upper extremity supported;Feet supported Static Sitting - Level of Assistance: 5: Stand by assistance Dynamic Sitting Balance Dynamic Sitting - Balance Support: Bilateral upper extremity supported;Feet supported Dynamic Sitting - Level of Assistance:  4: Min assist Sitting balance - Comments: Requires at least 1 UE support on bed for support d/t pain and CGA with cues for maintaining back precautions Static Standing Balance Static Standing - Balance Support: Bilateral upper extremity supported;During functional activity Static Standing - Level of Assistance: 4: Min assist (CGA) Dynamic Standing Balance Dynamic Standing - Balance Support: Bilateral upper extremity supported;During functional activity Dynamic Standing - Level of Assistance: 4: Min assist (CGA) Extremity/Trunk Assessment RUE Assessment RUE Assessment: Within Functional Limits LUE Assessment LUE Assessment: Within Functional Limits  Care Tool Care Tool Self Care Eating   Eating Assist Level: Independent    Oral Care    Oral Care Assist Level: Set up assist    Bathing   Body parts bathed by patient: Right arm;Left arm;Chest;Abdomen;Front perineal area;Right upper leg;Left upper leg Body parts bathed by helper: Buttocks;Right lower leg;Left lower leg   Assist Level: Moderate Assistance - Patient 50 - 74%    Upper Body Dressing(including orthotics)   What is the patient wearing?: Hospital gown only;Pull over shirt;Orthosis   Assist Level: Minimal Assistance - Patient > 75%    Lower Body Dressing (excluding footwear)   What is the patient wearing?: Pants;Incontinence brief Assist for lower body dressing: Total Assistance - Patient < 25%    Putting on/Taking off footwear   What is the patient wearing?: Non-skid slipper socks Assist for footwear: Total Assistance - Patient < 25%       Care Tool Toileting Toileting activity   Assist for toileting: Moderate Assistance - Patient 50 - 74%     Care Tool Bed Mobility Roll left and right activity   Roll left and right assist level: Minimal Assistance - Patient > 75%    Sit to lying activity   Sit to lying assist level: Minimal Assistance - Patient > 75%    Lying to sitting on side of bed activity   Lying to  sitting on side of bed assist level: the ability to move from lying on the back to sitting on the side of the bed with no back support.: Minimal Assistance - Patient > 75%     Care Tool Transfers Sit to stand transfer   Sit to stand assist level: Minimal Assistance - Patient > 75%    Chair/bed transfer   Chair/bed transfer assist level: Minimal Assistance - Patient > 75%     Toilet transfer   Assist Level: Minimal Assistance - Patient > 75%     Care Tool Cognition  Expression of Ideas and Wants Expression of Ideas and Wants: 4. Without difficulty (complex and basic) - expresses complex messages without difficulty and with speech that is clear and easy to understand  Understanding Verbal and Non-Verbal Content Understanding Verbal and  Non-Verbal Content: 4. Understands (complex and basic) - clear comprehension without cues or repetitions   Memory/Recall Ability Memory/Recall Ability : Current season;Location of own room;Staff names and faces;That he or she is in a hospital/hospital unit   Refer to Care Plan for [place] 1 OT Short Term Goal 1 (Week 1): Pt will complete grooming task in standing with CGA. OT Short Term Goal 2 (Week 1): Pt will complete ambulatory toilet transfer with min A + LRAD. OT Short Term Goal 3 (Week 1): Pt will don pants with mod A + AE PRN.  Recommendations for other services: Therapeutic Recreation  Pet therapy, Kitchen group, Stress management, and Outing/community reintegration   Skilled Therapeutic Intervention ADL ADL Eating: Independent Where Assessed-Eating: Chair;Bed level Grooming: Setup Where Assessed-Grooming: Sitting at sink Upper Body Bathing: Setup Where Assessed-Upper Body Bathing: Sitting at sink Lower Body Bathing: Moderate assistance Where Assessed-Lower Body Bathing: Standing at sink Upper Body Dressing: Setup Where Assessed-Upper Body Dressing: Sitting at sink Lower Body Dressing: Maximal  assistance Where Assessed-Lower Body Dressing: Standing at sink Toileting: Moderate assistance Where Assessed-Toileting: Toilet;Bedside Commode Toilet Transfer: Minimal assistance Toilet Transfer Method: Stand pivot Toilet Transfer Equipment: Extra wide bedside commode;Grab bars Tub/Shower Transfer: Not assessed Social research officer, government: Not assessed Mobility  Bed Mobility Bed Mobility: Left Sidelying to Sit;Rolling Left;Sit to Supine Rolling Left: Contact Guard/Touching assist Left Sidelying to Sit: Minimal Assistance - Patient >75% Sit to Supine: Minimal Assistance - Patient > 75% Transfers Sit to Stand: Minimal Assistance - Patient > 75%;Moderate Assistance - Patient 50-74% Stand to Sit: Minimal Assistance - Patient > 75%  Session 1 ([id number]): Pt received supine in bed, endorses inner R thigh pain, but agreeable to OT eval. Ind recalled 3/3 spinal precautions and req mod assist to don lumbar corset sitting EOB. Reviewed role of CIR OT, evaluation process, ADL/func mobility retraining, goals for therapy, and safety plan. Evaluation completed as documented above.Came to sitting EOB via log roll with min cues for technique and min A to lift trunk. Stand-pivot throughout session x2 with min A to power up from lower surface and cues for technique, best results with pushing up from arm rests with BUE. Completed UBD and bathing seated at sink with set-up A. Total A for LBD and would required mod A for bathing lower BLE/buttocks at sink. Will benefit from LBD AE education. Endorses pain increases the most, up to "10/10" from sit > stand and worsens with prolonged standing. Pt motivated to "walk out of here." Pt left seated in recliner with chair alarm engaged, call bell in reach, and all immediate needs met. MD in/out morning assessment.    Session 2 [phone]): Pt received supine in bed, endorses R lower back pain but agreeable to bed level therapy with encouragement. Reports having been left in  the w/c too long that aggravated his pain. Did not rate. Session focus on self-care retraining, activity tolerance, AE education, nonpharm pain management in prep for improved ADL/IADL/func mobility performance + decreased caregiver burden. Came to sitting EOB with min A to lift trunk and increased time due to pain. Donned lumbar corset with max A. Issued LH sponge and demonstrated [name] for LB bathing. Pt able to thread BLE into scrub pants with reacher and increased time after demonstration. Pt with signs of increased in time and requesting to lay down. Returned to supine mod A to progress BLE onto bed, max + 2 to boost up. Pt interested in heating packs for  back pain, placed in lower R back. Nursing notified to assist pt to remove. Pt missed 15 min of OT due to pain.  Pt left seated in recliner with chair alarm engaged, call bell in reach, and all immediate needs met.    Discharge Criteria: Patient will be discharged from OT if patient refuses treatment 3 consecutive times without medical reason, if treatment goals not met, if there is a change in medical status, if patient makes no progress towards goals or if patient is discharged from hospital.  The above assessment, treatment plan, treatment alternatives and goals were discussed and mutually agreed upon: by patient  Volanda Napoleon MS, OTR/L  01/19/2021, 12:55 PM

## 2021-01-19 NOTE — Progress Notes (Signed)
PROGRESS NOTE   Subjective/Complaints: Complains of spasms in his right proximal thigh, in groin area. Right leg generally with pain as well.   ROS: Patient denies fever, rash, sore throat, blurred vision, nausea, vomiting, diarrhea, cough, shortness of breath or chest pain,   headache, or mood change.    Objective:   DG Abd 1 View  Result Date: 01/18/2021 CLINICAL DATA:  Constipated EXAM: ABDOMEN - 1 VIEW COMPARISON:  None. FINDINGS: The bowel gas pattern is normal. No radio-opaque calculi or other significant radiographic abnormality are seen. Lumbar surgical hardware. IMPRESSION: Nonobstructive bowel gas pattern Electronically Signed   By: Iven Finn M.D.   On: 01/18/2021 15:32   Recent Labs    01/18/21 0605 01/18/21 1347  HGB 9.0* 8.8*  HCT 25.8* 26.0*   No results for input(s): NA, K, CL, CO2, GLUCOSE, BUN, CREATININE, CALCIUM in the last 72 hours.  Intake/Output Summary (Last 24 hours) at 01/19/2021 1056 Last data filed at 01/19/2021 0415 Gross per 24 hour  Intake 360 ml  Output 3450 ml  Net -3090 ml        Physical Exam: Vital Signs Blood pressure 126/74, pulse 94, temperature 98.9 F (37.2 C), resp. rate 18, height 6\' 2"  (1.88 m), weight (!) 147.3 kg, SpO2 95 %.  General: Alert and oriented x 3, No apparent distress HEENT: Head is normocephalic, atraumatic, PERRLA, EOMI, sclera anicteric, oral mucosa pink and moist, dentition intact, ext ear canals clear,  Neck: Supple without JVD or lymphadenopathy Heart: Reg rate and rhythm. No murmurs rubs or gallops Chest: CTA bilaterally without wheezes, rales, or rhonchi; no distress Abdomen: Soft, non-tender, non-distended, bowel sounds positive. Extremities: No clubbing, cyanosis, or edema. Pulses are 2+ Psych: Pt's affect is appropriate. Pt is cooperative Skin: incision a little boggy Neuro:  Alert and oriented x 3. Normal insight and awareness. Intact Memory.  Normal language and speech. Cranial nerve exam unremarkable. UE motor 5/5. RLE limited by pain but 4 to 4+, LLE 4+ to 5. No focal sensory findings. DTR's 1+ Musculoskeletal: right hip adductors tender with palpation and with attempts at HAD. No focal spasm appreciated. Chronic OA changes right knee.   Assessment/Plan: 1. Functional deficits which require 3+ hours per day of interdisciplinary therapy in a comprehensive inpatient rehab setting. Physiatrist is providing close team supervision and 24 hour management of active medical problems listed below. Physiatrist and rehab team continue to assess barriers to discharge/monitor patient progress toward functional and medical goals  Care Tool:  Bathing              Bathing assist       Upper Body Dressing/Undressing Upper body dressing   What is the patient wearing?: Hospital gown only    Upper body assist Assist Level: Minimal Assistance - Patient > 75%    Lower Body Dressing/Undressing Lower body dressing            Lower body assist       Toileting Toileting    Toileting assist Assist for toileting: 2 Helpers     Transfers Chair/bed transfer  Transfers assist     Chair/bed transfer assist level: Dependent - mechanical lift  Locomotion Ambulation   Ambulation assist              Walk 10 feet activity   Assist           Walk 50 feet activity   Assist           Walk 150 feet activity   Assist           Walk 10 feet on uneven surface  activity   Assist           Wheelchair     Assist               Wheelchair 50 feet with 2 turns activity    Assist            Wheelchair 150 feet activity     Assist          Blood pressure 126/74, pulse 94, temperature 98.9 F (37.2 C), resp. rate 18, height 6\' 2"  (1.88 m), weight (!) 147.3 kg, SpO2 95 %. Medical Problem List and Plan: 1.  Impaired mobility s/p TLIF for lumbar radiculoapthy              -patient may shower but incision must be covered             -ELOS/Goals: 14 days              -Continue CIR therapies including PT, OT 2.  Antithrombotics: -DVT/anticoagulation:  Pharmaceutical: Lovenox             -antiplatelet therapy: N/a 3. Pain Management: Continue oxycodone prn. .             -- scheduled Zanaflex to 2 mg TID             --Continue Gabapentin 600 mg TID (PTA)  -add kpad for right thigh/leg 4. Mood: LCSW to follow for evaluation and support.              -antipsychotic agents: N/A 5. Neuropsych: This patient is capable of making decisions on his own behalf. 6. Spinal incision: Monitor wound for healing. Routine pressure relief measures.              --started MVI as well as juven to help promote wound healing.              -given bogginess and drainage, doxycycline started -primary team requested NSGY to follow.  7. Fluids/Electrolytes/Nutrition: Monitor I/O--encourage fluid intake. 8. HTN; Did have issues with significant hypotension post op--continue to hold BP meds for now. -- orthostatic vitals were ordered.  9. T2DM: Hgb A1C-9.0. Add CM restrictions. Renal status stable--continue to encourage fluid intake.               ---Monitor BS ac/hs with SSI for elevated BS.              --discontinued levemir and resume metformin.             CBG (last 3)  Recent Labs    01/18/21 0628 01/18/21 1130 01/18/21 1643  GLUCAP 202* 243* 176*   10/8 cbg's still quite high. May need second med or resumption of insulin   -observe today 10. Acute blood loss anemia: Monitor for signs of bleeding. Recheck CBC on 10/10 11. Acute on chronic CKD?: Will recheck CMET on 10/10.  12. OSA: Continue CPAP at nights.  13.  Elevated CK: Question rhabdo due to statin. Continue to hold Crestor and recheck CK on 01/21/21. 14.  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       LOS: 1 days A FACE TO FACE EVALUATION WAS PERFORMED  [name] 01/19/2021, 10:56 AM

## 2021-01-19 NOTE — Progress Notes (Signed)
RT note. Patient cpap all set up for the night. Patient using home mask/circuit, CPAP set on 15 per patient. Patient stated he can place self on cpap when ready for bed. RT will continue to monitor.

## 2021-01-19 NOTE — Plan of Care (Signed)
  Problem: RH Balance Goal: LTG Patient will maintain dynamic standing with ADLs (OT) Description: LTG:  Patient will maintain dynamic standing balance with assist during activities of daily living (OT)  Flowsheets (Taken 01/19/2021 1257) LTG: Pt will maintain dynamic standing balance during ADLs with: Independent with assistive device   Problem: Sit to Stand Goal: LTG:  Patient will perform sit to stand in prep for activites of daily living with assistance level (OT) Description: LTG:  Patient will perform sit to stand in prep for activites of daily living with assistance level (OT) Flowsheets (Taken 01/19/2021 1257) LTG: PT will perform sit to stand in prep for activites of daily living with assistance level: Independent with assistive device   Problem: RH Grooming Goal: LTG Patient will perform grooming w/assist,cues/equip (OT) Description: LTG: Patient will perform grooming with assist, with/without cues using equipment (OT) Flowsheets (Taken 01/19/2021 1257) LTG: Pt will perform grooming with assistance level of: Independent with assistive device    Problem: RH Bathing Goal: LTG Patient will bathe all body parts with assist levels (OT) Description: LTG: Patient will bathe all body parts with assist levels (OT) Flowsheets (Taken 01/19/2021 1257) LTG: Pt will perform bathing with assistance level/cueing: Supervision/Verbal cueing   Problem: RH Dressing Goal: LTG Patient will perform upper body dressing (OT) Description: LTG Patient will perform upper body dressing with assist, with/without cues (OT). Flowsheets (Taken 01/19/2021 1257) LTG: Pt will perform upper body dressing with assistance level of: Independent Goal: LTG Patient will perform lower body dressing w/assist (OT) Description: LTG: Patient will perform lower body dressing with assist, with/without cues in positioning using equipment (OT) Flowsheets (Taken 01/19/2021 1257) LTG: Pt will perform lower body dressing with  assistance level of: Independent with assistive device   Problem: RH Toileting Goal: LTG Patient will perform toileting task (3/3 steps) with assistance level (OT) Description: LTG: Patient will perform toileting task (3/3 steps) with assistance level (OT)  Flowsheets (Taken 01/19/2021 1257) LTG: Pt will perform toileting task (3/3 steps) with assistance level: Independent with assistive device   Problem: RH Simple Meal Prep Goal: LTG Patient will perform simple meal prep w/assist (OT) Description: LTG: Patient will perform simple meal prep with assistance, with/without cues (OT). Flowsheets (Taken 01/19/2021 1257) LTG: Pt will perform simple meal prep with assistance level of: Supervision/Verbal cueing   Problem: RH Toilet Transfers Goal: LTG Patient will perform toilet transfers w/assist (OT) Description: LTG: Patient will perform toilet transfers with assist, with/without cues using equipment (OT) Flowsheets (Taken 01/19/2021 1257) LTG: Pt will perform toilet transfers with assistance level of: Independent with assistive device   Problem: RH Tub/Shower Transfers Goal: LTG Patient will perform tub/shower transfers w/assist (OT) Description: LTG: Patient will perform tub/shower transfers with assist, with/without cues using equipment (OT) Flowsheets (Taken 01/19/2021 1257) LTG: Pt will perform tub/shower stall transfers with assistance level of: Supervision/Verbal cueing   Problem: RH Furniture Transfers Goal: LTG Patient will perform furniture transfers w/assist (OT/PT) Description: LTG: Patient will perform furniture transfers  with assistance (OT/PT). Flowsheets (Taken 01/19/2021 1257) LTG: Pt will perform furniture transfers with assist:: Independent with assistive device

## 2021-01-20 LAB — GLUCOSE, CAPILLARY
Glucose-Capillary: 156 mg/dL — ABNORMAL HIGH (ref 70–99)
Glucose-Capillary: 248 mg/dL — ABNORMAL HIGH (ref 70–99)
Glucose-Capillary: 161 mg/dL — ABNORMAL HIGH (ref 70–99)
Glucose-Capillary: 183 mg/dL — ABNORMAL HIGH (ref 70–99)

## 2021-01-20 NOTE — Progress Notes (Addendum)
PROGRESS NOTE   Subjective/Complaints: Woke up with back pain at 0400 this morning. Blankets were rolled up which he feels pressed upon back  ROS: Patient denies fever, rash, sore throat, blurred vision, nausea, vomiting, diarrhea, cough, shortness of breath or chest pain, headache, or mood change.     Objective:   DG Abd 1 View  Result Date: 01/18/2021 CLINICAL DATA:  Constipated EXAM: ABDOMEN - 1 VIEW COMPARISON:  None. FINDINGS: The bowel gas pattern is normal. No radio-opaque calculi or other significant radiographic abnormality are seen. Lumbar surgical hardware. IMPRESSION: Nonobstructive bowel gas pattern Electronically Signed   By: Iven Finn M.D.   On: 01/18/2021 15:32   Recent Labs    01/18/21 0605 01/18/21 1347  HGB 9.0* 8.8*  HCT 25.8* 26.0*   No results for input(s): NA, K, CL, CO2, GLUCOSE, BUN, CREATININE, CALCIUM in the last 72 hours.  Intake/Output Summary (Last 24 hours) at 01/20/2021 0922 Last data filed at 01/19/2021 2053 Gross per 24 hour  Intake 1440 ml  Output 2475 ml  Net -1035 ml        Physical Exam: Vital Signs Blood pressure (!) 157/82, pulse (!) 101, temperature (!) 97.4 F (36.3 C), temperature source Oral, resp. rate 19, height 6\' 2"  (1.88 m), weight (!) 147.3 kg, SpO2 96 %.  Constitutional: No distress . Vital signs reviewed. HEENT: NCAT, EOMI, oral membranes moist Neck: supple Cardiovascular: RRR without murmur. No JVD    Respiratory/Chest: CTA Bilaterally without wheezes or rales. Normal effort    GI/Abdomen: BS +, non-tender, non-distended Ext: no clubbing, cyanosis, or edema Psych: pleasant and cooperative  Skin: incision remains a little boggy Neuro:  Alert and oriented x 3. Normal insight and awareness. Intact Memory. Normal language and speech. Cranial nerve exam unremarkable. UE motor 5/5. RLE limited by pain but 4 to 4+, LLE 4+ to 5. No focal sensory findings. DTR's  1+ Musculoskeletal: right hip adductors tender with palpation and with attempts at HAD. No focal spasm appreciated. Chronic OA changes right knee.   Assessment/Plan: 1. Functional deficits which require 3+ hours per day of interdisciplinary therapy in a comprehensive inpatient rehab setting. Physiatrist is providing close team supervision and 24 hour management of active medical problems listed below. Physiatrist and rehab team continue to assess barriers to discharge/monitor patient progress toward functional and medical goals  Care Tool:  Bathing    Body parts bathed by patient: Right arm, Left arm, Chest, Abdomen, Front perineal area, Right upper leg, Left upper leg   Body parts bathed by helper: Buttocks, Right lower leg, Left lower leg     Bathing assist Assist Level: Moderate Assistance - Patient 50 - 74%     Upper Body Dressing/Undressing Upper body dressing   What is the patient wearing?: Hospital gown only, Pull over shirt, Orthosis    Upper body assist Assist Level: Minimal Assistance - Patient > 75%    Lower Body Dressing/Undressing Lower body dressing      What is the patient wearing?: Pants, Incontinence brief     Lower body assist Assist for lower body dressing: Total Assistance - Patient < 25%     Toileting Toileting  Toileting assist Assist for toileting: Moderate Assistance - Patient 50 - 74%     Transfers Chair/bed transfer  Transfers assist     Chair/bed transfer assist level: Minimal Assistance - Patient > 75%     Locomotion Ambulation   Ambulation assist      Assist level: Minimal Assistance - Patient > 75% Assistive device: Walker-rolling Max distance: 5 ft   Walk 10 feet activity   Assist  Walk 10 feet activity did not occur: Safety/medical concerns        Walk 50 feet activity   Assist Walk 50 feet with 2 turns activity did not occur: Safety/medical concerns         Walk 150 feet activity   Assist Walk 150  feet activity did not occur: Safety/medical concerns         Walk 10 feet on uneven surface  activity   Assist Walk 10 feet on uneven surfaces activity did not occur: Safety/medical concerns         Wheelchair     Assist Is the patient using a wheelchair?: No Type of Wheelchair: Manual    Wheelchair assist level: Dependent - Patient 0% Max wheelchair distance: 10 ft    Wheelchair 50 feet with 2 turns activity    Assist        Assist Level: Dependent - Patient 0%   Wheelchair 150 feet activity     Assist      Assist Level: Dependent - Patient 0%   Blood pressure (!) 157/82, pulse (!) 101, temperature (!) 97.4 F (36.3 C), temperature source Oral, resp. rate 19, height 6\' 2"  (1.88 m), weight (!) 147.3 kg, SpO2 96 %. Medical Problem List and Plan: 1.  Impaired mobility s/p TLIF for lumbar radiculoapthy             -patient may shower but incision must be covered             -ELOS/Goals: 14 days              -Continue CIR therapies including PT, OT  2.  Antithrombotics: -DVT/anticoagulation:  Pharmaceutical: Lovenox             -antiplatelet therapy: N/a 3. Pain Management: Continue oxycodone prn. .             --scheduled Zanaflex increased to 4 mg TID             --Continue Gabapentin 600 mg TID (PTA)  -continue kpad for right thigh/leg  -ms contin 15mg  q12 started friday 4. Mood: LCSW to follow for evaluation and support.              -antipsychotic agents: N/A 5. Neuropsych: This patient is capable of making decisions on his own behalf. 6. Spinal incision: Monitor wound for healing. Routine pressure relief measures.              -started MVI as well as juven to help promote wound healing.              -given bogginess and drainage, continue doxycycline -primary team requested NSGY to follow along.  7. Fluids/Electrolytes/Nutrition: Monitor I/O--encourage fluid intake. 8. HTN; Did have issues with significant hypotension post op--continue to  hold BP meds for now. -- orthostatic vitals were ordered.  9. T2DM: Hgb A1C-9.0. Add CM restrictions. Renal status stable--continue to encourage fluid intake.               ---Monitor BS ac/hs with SSI for  elevated BS.              --discontinued levemir and resume metformin.             CBG (last 3)  Recent Labs    01/19/21 1622 01/19/21 2038 01/20/21 0614  GLUCAP 168* 140* 156*   10/9 cbg's under better control today. May need second med or resumption of insulin. Observe for pattern 10. Acute blood loss anemia: Monitor for signs of bleeding. Recheck CBC on 10/10 11. Acute on chronic CKD?: Will recheck CMET on 10/10.  12. OSA: Continue CPAP at nights.  13.  Elevated CK: Question rhabdo due to statin. Continue to hold Crestor and recheck CK on 01/21/21. 14.  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       LOS: 2 days A FACE TO FACE EVALUATION WAS PERFORMED  Meredith Staggers 01/20/2021, 9:22 AM

## 2021-01-20 NOTE — Plan of Care (Signed)
  Problem: RH Balance Goal: LTG Patient will maintain dynamic sitting balance (PT) Description: LTG:  Patient will maintain dynamic sitting balance with assistance during mobility activities (PT) Flowsheets (Taken 01/19/2021 1741) LTG: Pt will maintain dynamic sitting balance during mobility activities with:: Independent Goal: LTG Patient will maintain dynamic standing balance (PT) Description: LTG:  Patient will maintain dynamic standing balance with assistance during mobility activities (PT) Flowsheets (Taken 01/19/2021 1741) LTG: Pt will maintain dynamic standing balance during mobility activities with:: Independent with assistive device    Problem: Sit to Stand Goal: LTG:  Patient will perform sit to stand with assistance level (PT) Description: LTG:  Patient will perform sit to stand with assistance level (PT) Flowsheets (Taken 01/19/2021 1741) LTG: PT will perform sit to stand in preparation for functional mobility with assistance level: Independent with assistive device   Problem: RH Bed to Chair Transfers Goal: LTG Patient will perform bed/chair transfers w/assist (PT) Description: LTG: Patient will perform bed to chair transfers with assistance (PT). Flowsheets (Taken 01/19/2021 1741) LTG: Pt will perform Bed to Chair Transfers with assistance level: Independent with assistive device    Problem: RH Car Transfers Goal: LTG Patient will perform car transfers with assist (PT) Description: LTG: Patient will perform car transfers with assistance (PT). Flowsheets (Taken 01/19/2021 1741) LTG: Pt will perform car transfers with assist:: Supervision/Verbal cueing   Problem: RH Ambulation Goal: LTG Patient will ambulate in controlled environment (PT) Description: LTG: Patient will ambulate in a controlled environment, # of feet with assistance (PT). Flowsheets (Taken 01/19/2021 1741) LTG: Pt will ambulate in controlled environ  assist needed:: Supervision/Verbal cueing LTG: Ambulation  distance in controlled environment: at least 100 feet using LRAD Goal: LTG Patient will ambulate in home environment (PT) Description: LTG: Patient will ambulate in home environment, # of feet with assistance (PT). Flowsheets (Taken 01/19/2021 1741) LTG: Pt will ambulate in home environ  assist needed:: Supervision/Verbal cueing LTG: Ambulation distance in home environment: 50 ft using LRAD   Problem: RH Stairs Goal: LTG Patient will ambulate up and down stairs w/assist (PT) Description: LTG: Patient will ambulate up and down # of stairs with assistance (PT) Flowsheets (Taken 01/19/2021 1741) LTG: Pt will ambulate up/down stairs assist needed:: Contact Guard/Touching assist LTG: Pt will  ambulate up and down number of stairs: at least 3 steps using LRAD or HR setup as per home environment

## 2021-01-20 NOTE — Progress Notes (Signed)
RT note. Patient stated he can place self on cpap when ready for bed.

## 2021-01-21 ENCOUNTER — Encounter: Payer: 59 | Admitting: Internal Medicine

## 2021-01-21 ENCOUNTER — Inpatient Hospital Stay (HOSPITAL_COMMUNITY): Payer: 59

## 2021-01-21 LAB — URINALYSIS, ROUTINE W REFLEX MICROSCOPIC
Bacteria, UA: NONE SEEN
Bilirubin Urine: NEGATIVE
Glucose, UA: 50 mg/dL — AB
Ketones, ur: NEGATIVE mg/dL
Leukocytes,Ua: NEGATIVE
Nitrite: NEGATIVE
Protein, ur: NEGATIVE mg/dL
Specific Gravity, Urine: 1.014 (ref 1.005–1.030)
pH: 5 (ref 5.0–8.0)

## 2021-01-21 LAB — COMPREHENSIVE METABOLIC PANEL
ALT: 27 U/L (ref 0–44)
AST: 22 U/L (ref 15–41)
Albumin: 2.8 g/dL — ABNORMAL LOW (ref 3.5–5.0)
Alkaline Phosphatase: 80 U/L (ref 38–126)
Anion gap: 15 (ref 5–15)
BUN: 16 mg/dL (ref 6–20)
CO2: 22 mmol/L (ref 22–32)
Calcium: 8.7 mg/dL — ABNORMAL LOW (ref 8.9–10.3)
Chloride: 95 mmol/L — ABNORMAL LOW (ref 98–111)
Creatinine, Ser: 1.18 mg/dL (ref 0.61–1.24)
GFR, Estimated: >60 mL/min (ref >60)
Glucose, Bld: 243 mg/dL — ABNORMAL HIGH (ref 70–99)
Potassium: 3.8 mmol/L (ref 3.5–5.1)
Sodium: 132 mmol/L — ABNORMAL LOW (ref 135–145)
Total Bilirubin: 1.4 mg/dL — ABNORMAL HIGH (ref 0.3–1.2)
Total Protein: 6.6 g/dL (ref 6.5–8.1)

## 2021-01-21 LAB — CBC WITH DIFFERENTIAL/PLATELET
Abs Immature Granulocytes: 0.2 10*3/uL — ABNORMAL HIGH (ref 0.00–0.07)
Basophils Absolute: 0 10*3/uL (ref 0.0–0.1)
Basophils Relative: 0 %
Eosinophils Absolute: 0.1 10*3/uL (ref 0.0–0.5)
Eosinophils Relative: 1 %
HCT: 31.8 % — ABNORMAL LOW (ref 39.0–52.0)
Hemoglobin: 11.1 g/dL — ABNORMAL LOW (ref 13.0–17.0)
Immature Granulocytes: 3 %
Lymphocytes Relative: 5 %
Lymphs Abs: 0.4 10*3/uL — ABNORMAL LOW (ref 0.7–4.0)
MCH: 30.8 pg (ref 26.0–34.0)
MCHC: 34.9 g/dL (ref 30.0–36.0)
MCV: 88.3 fL (ref 80.0–100.0)
Monocytes Absolute: 0.1 10*3/uL (ref 0.1–1.0)
Monocytes Relative: 1 %
Neutro Abs: 7.1 10*3/uL (ref 1.7–7.7)
Neutrophils Relative %: 90 %
Platelets: 381 10*3/uL (ref 150–400)
RBC: 3.6 MIL/uL — ABNORMAL LOW (ref 4.22–5.81)
RDW: 13.1 % (ref 11.5–15.5)
WBC: 7.9 10*3/uL (ref 4.0–10.5)
nRBC: 0.5 % — ABNORMAL HIGH (ref 0.0–0.2)

## 2021-01-21 LAB — PROCALCITONIN: Procalcitonin: 137.06 ng/mL

## 2021-01-21 LAB — GLUCOSE, CAPILLARY
Glucose-Capillary: 187 mg/dL — ABNORMAL HIGH (ref 70–99)
Glucose-Capillary: 262 mg/dL — ABNORMAL HIGH (ref 70–99)
Glucose-Capillary: 183 mg/dL — ABNORMAL HIGH (ref 70–99)

## 2021-01-21 LAB — MAGNESIUM: Magnesium: 1.4 mg/dL — ABNORMAL LOW (ref 1.7–2.4)

## 2021-01-21 LAB — CK: Total CK: 93 U/L (ref 49–397)

## 2021-01-21 IMAGING — DX DG CHEST 1V PORT
1 series · 1 of 1 positions shown · non-contrast
Comparison: CT chest [DATE]

CLINICAL DATA: Fever

EXAM:
PORTABLE CHEST 1 VIEW

[chest]
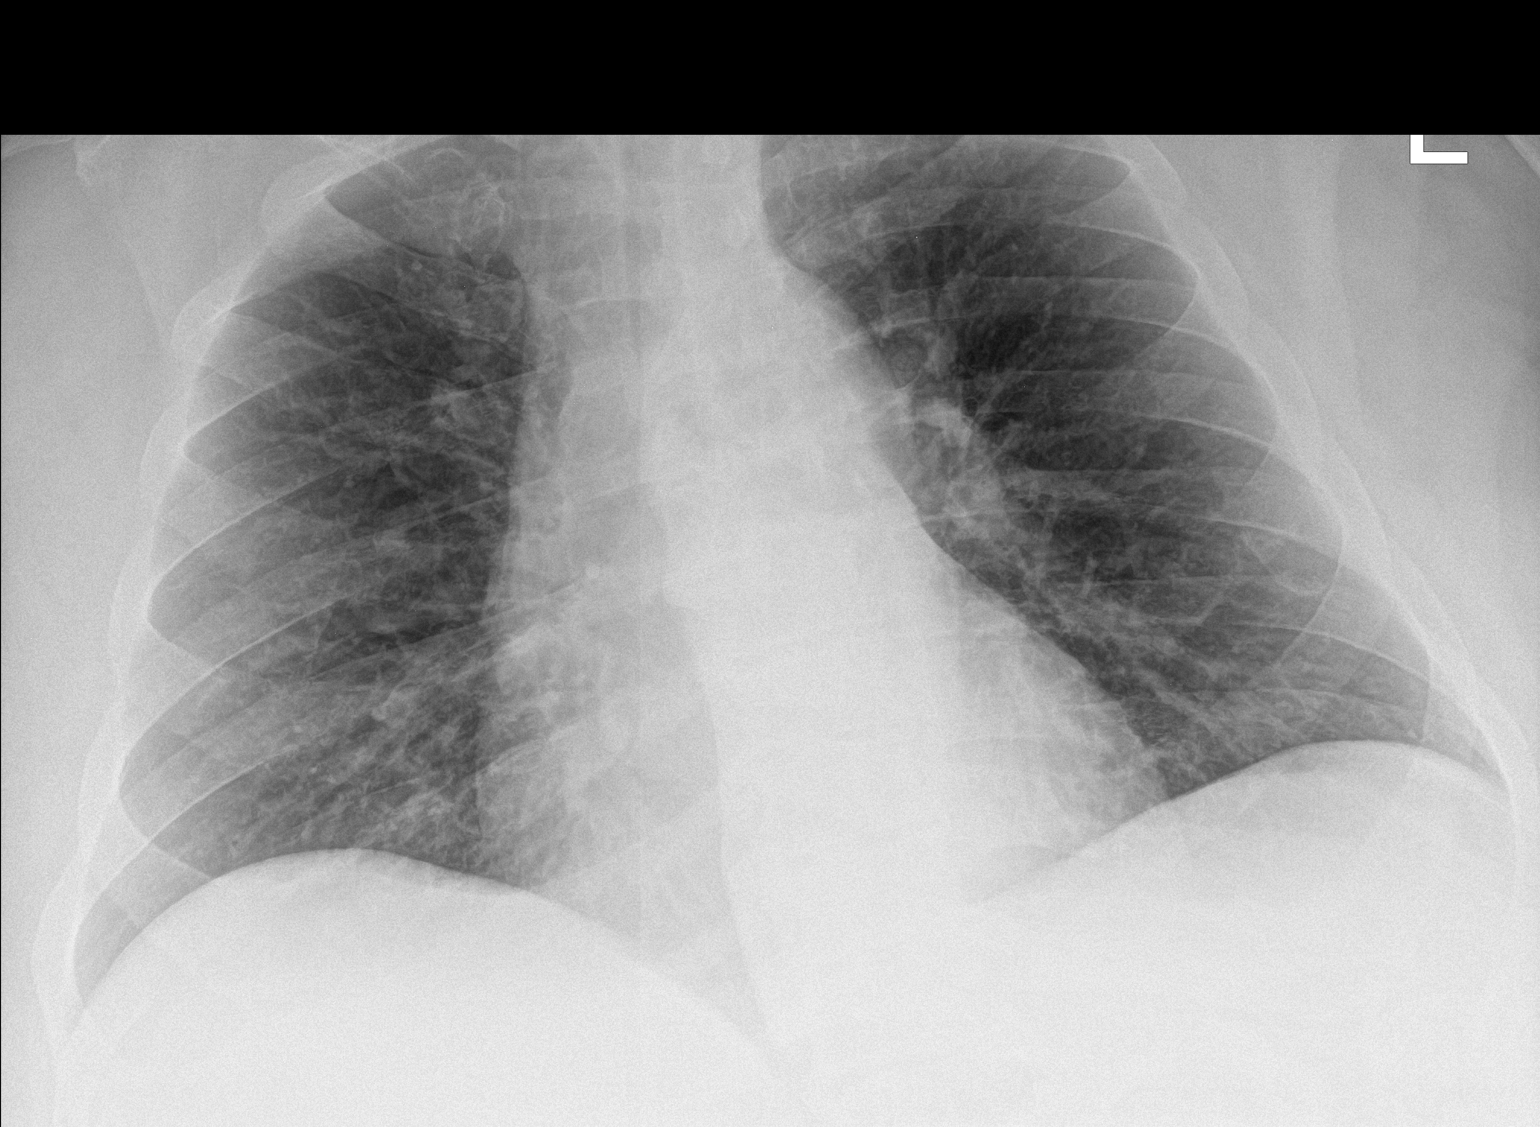

[1 of 1 positions shown; findings below may reference images not displayed]

FINDINGS: Prominent cardiac silhouette due to AP portable technique. The heart
and mediastinal contours are within normal limits.

No focal consolidation. No pulmonary edema. No pleural effusion. No
pneumothorax.

No acute osseous abnormality.
IMPRESSION: No active disease.

## 2021-01-21 IMAGING — DX DG ABD PORTABLE 1V
1 series · 4 of 4 positions shown · non-contrast
Comparison: x-ray abdomen [DATE]

CLINICAL DATA: fever

EXAM:
PORTABLE ABDOMEN - 1 VIEW

[Series 1: abdomen · 0.14mm/px · 4 of 4 slices shown]
[im 1/4]
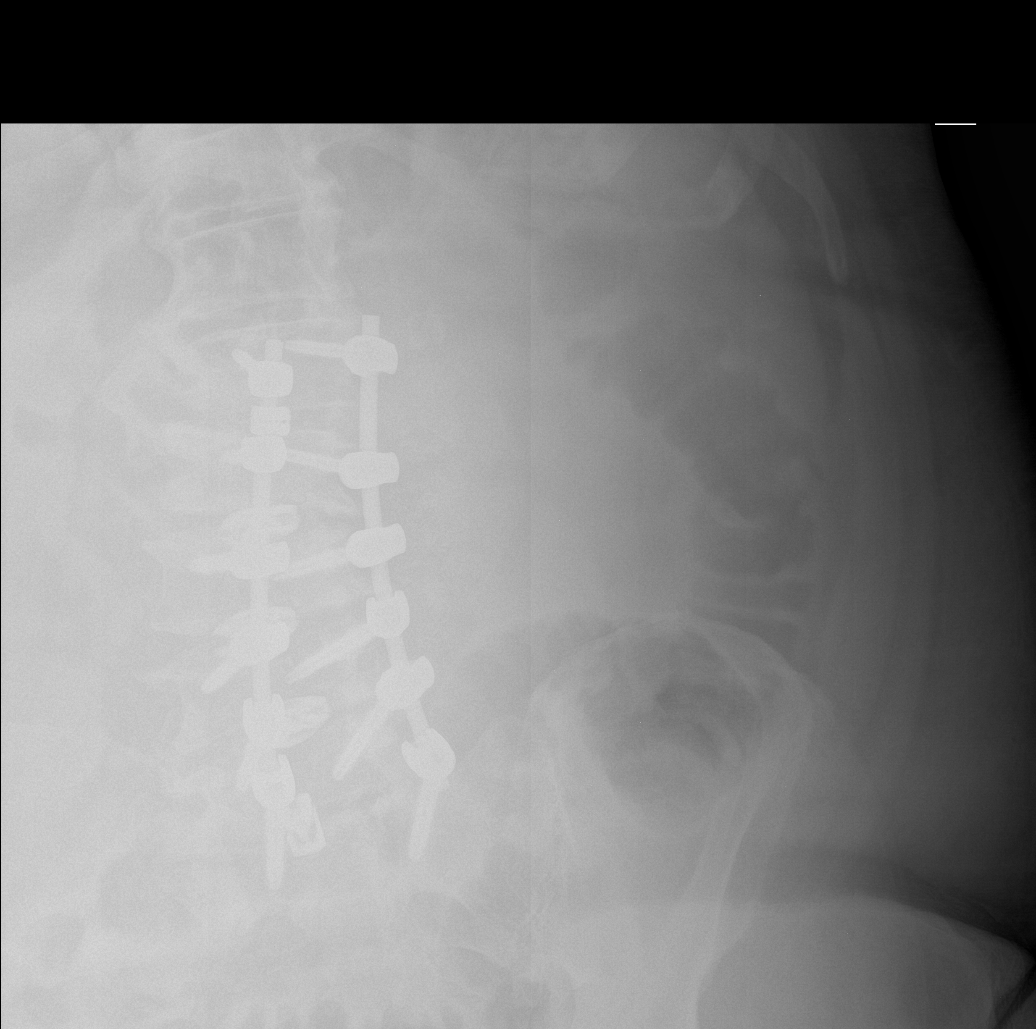
[im 2/4]
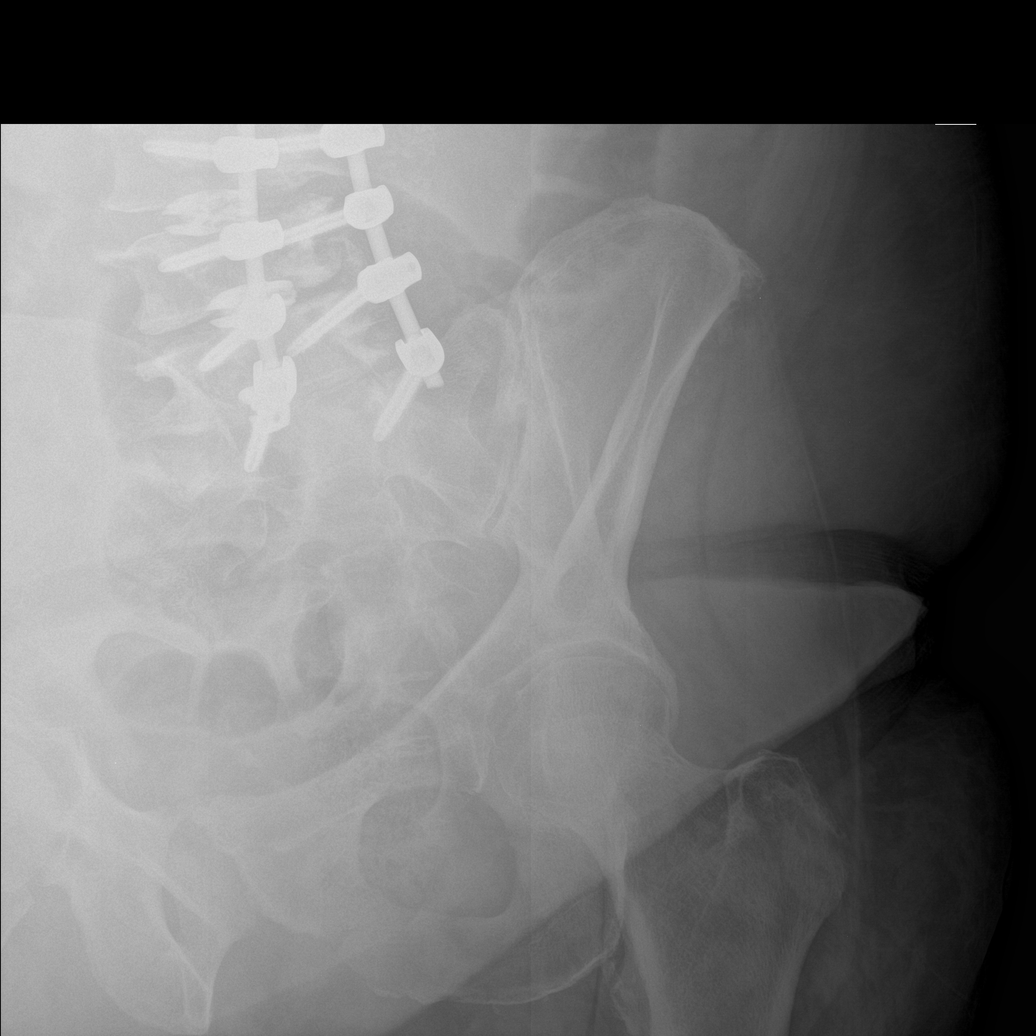
[im 3/4]
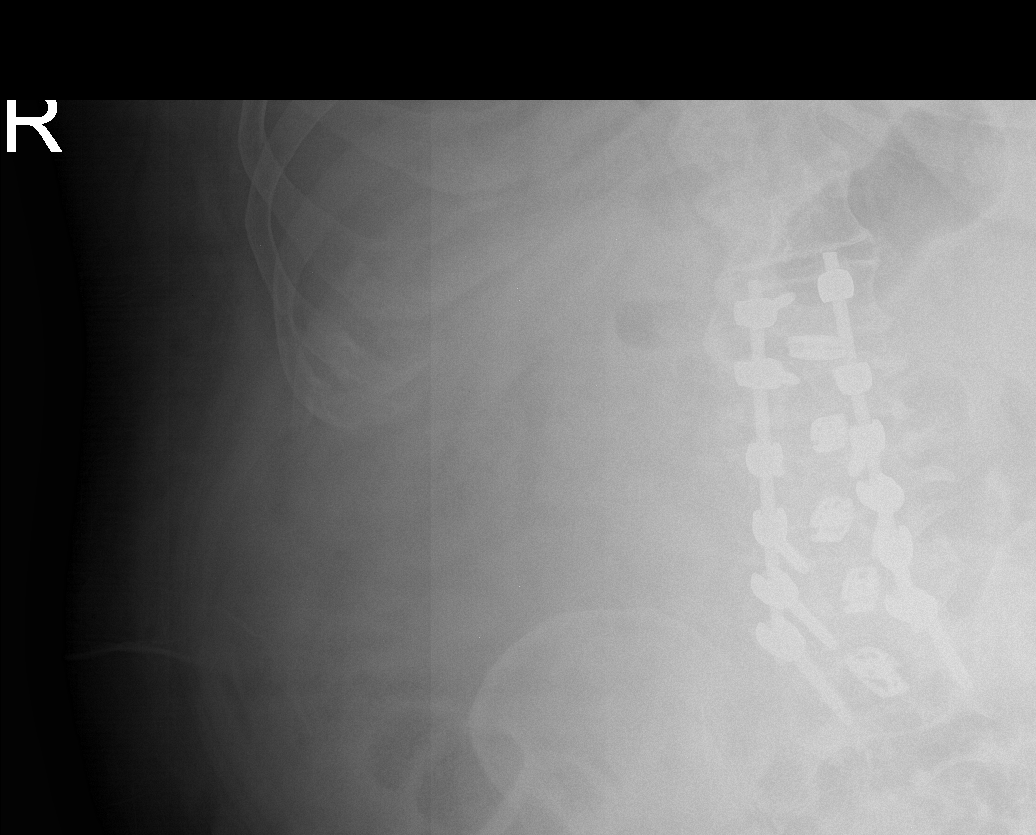
[im 4/4]
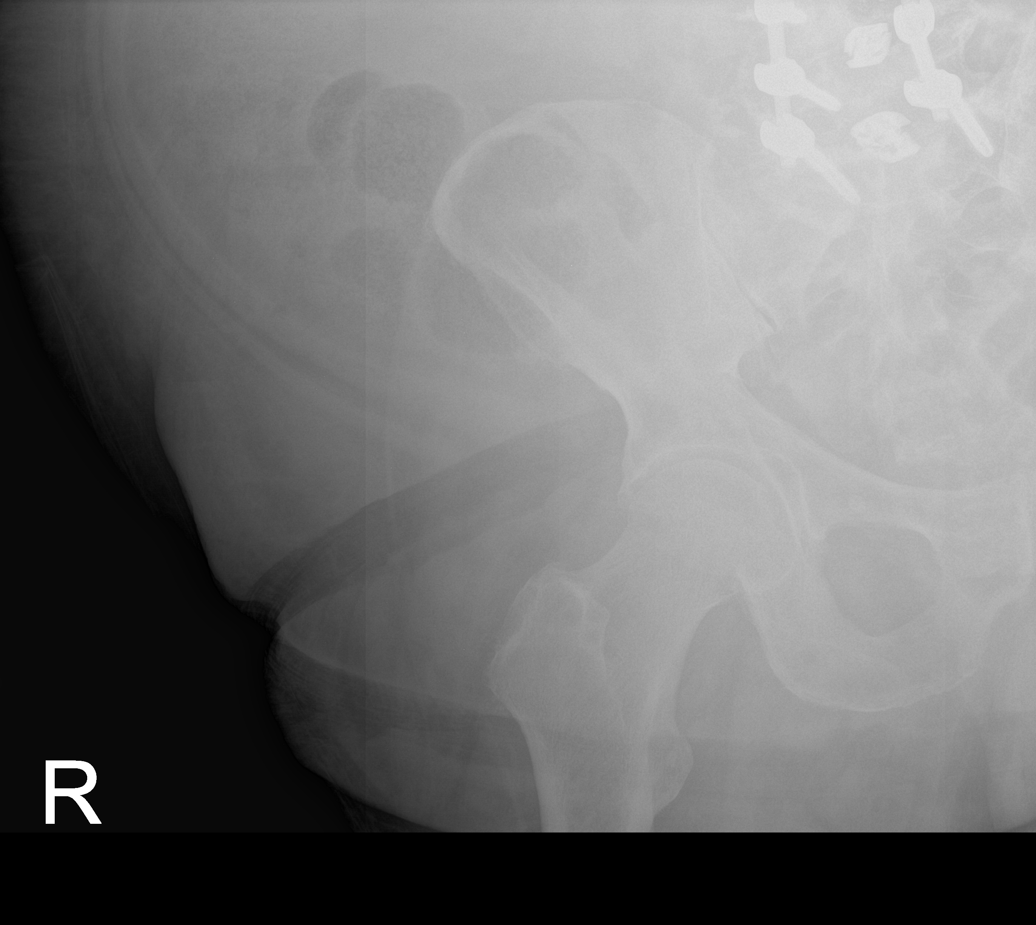

[4 of 4 positions shown; findings below may reference images not displayed]

FINDINGS: Markedly limited evaluation due to overlapping osseous structures
and overlying soft tissues.

Lumbosacral surgical hardware again noted.

The bowel gas pattern is normal. No radio-opaque calculi or other
significant radiographic abnormality are seen.
IMPRESSION: Nonobstructive bowel gas pattern.

## 2021-01-21 MED ORDER — PANTOPRAZOLE SODIUM 40 MG IV SOLR
40.0000 mg | INTRAVENOUS | Status: DC
  Administered 2021-01-21 – 2021-01-23 (×3): 40 mg via INTRAVENOUS
  Filled 2021-01-21 (×3): qty 40

## 2021-01-21 MED ORDER — SODIUM CHLORIDE 0.9 % IV SOLN
2.0000 g | INTRAVENOUS | Status: DC
  Administered 2021-01-21 – 2021-01-24 (×4): 2 g via INTRAVENOUS
  Filled 2021-01-21 (×4): qty 20

## 2021-01-21 MED ORDER — GABAPENTIN 400 MG PO CAPS
400.0000 mg | ORAL_CAPSULE | Freq: Three times a day (TID) | ORAL | Status: DC
  Administered 2021-01-21 – 2021-01-25 (×17): 400 mg via ORAL
  Filled 2021-01-21 (×17): qty 1

## 2021-01-21 MED ORDER — SODIUM CHLORIDE 0.9 % IV BOLUS
1000.0000 mL | Freq: Once | INTRAVENOUS | Status: AC
  Administered 2021-01-21: 1000 mL via INTRAVENOUS

## 2021-01-21 MED ORDER — VANCOMYCIN HCL 10 G IV SOLR
2500.0000 mg | Freq: Once | INTRAVENOUS | Status: AC
  Administered 2021-01-21: 2500 mg via INTRAVENOUS
  Filled 2021-01-21: qty 2500

## 2021-01-21 MED ORDER — SODIUM CHLORIDE 0.9 % IV SOLN
2.0000 g | Freq: Three times a day (TID) | INTRAVENOUS | Status: DC
  Filled 2021-01-21: qty 2

## 2021-01-21 MED ORDER — ENSURE MAX PROTEIN PO LIQD
11.0000 [oz_av] | Freq: Two times a day (BID) | ORAL | Status: DC
  Administered 2021-01-21 – 2021-01-25 (×8): 11 [oz_av] via ORAL

## 2021-01-21 MED ORDER — VANCOMYCIN HCL 1250 MG/250ML IV SOLN
1250.0000 mg | Freq: Two times a day (BID) | INTRAVENOUS | Status: DC
  Administered 2021-01-22: 1250 mg via INTRAVENOUS
  Filled 2021-01-21 (×2): qty 250

## 2021-01-21 MED ORDER — SORBITOL 70 % SOLN
30.0000 mL | Freq: Once | Status: AC
  Administered 2021-01-21: 30 mL via ORAL
  Filled 2021-01-21: qty 30

## 2021-01-21 MED ORDER — SODIUM CHLORIDE 0.9 % IV SOLN
2.0000 g | Freq: Once | INTRAVENOUS | Status: DC
  Filled 2021-01-21: qty 2

## 2021-01-21 MED ORDER — GLIPIZIDE 5 MG PO TABS
5.0000 mg | ORAL_TABLET | Freq: Two times a day (BID) | ORAL | Status: DC
  Administered 2021-01-22 – 2021-01-25 (×7): 5 mg via ORAL
  Filled 2021-01-21 (×7): qty 1

## 2021-01-21 NOTE — Progress Notes (Signed)
Urine sent as ordered for culture. All medications tolerated.  Temperature remains elevated at 100.1. Wound dressing changed today during therapy. Heavily soiled.  Surgeon came in and changed the dressing to honeycomb.  New orders for ABT and blood cultures. Will continue to monitor.

## 2021-01-21 NOTE — Progress Notes (Signed)
[facility] protocol continues. Urine sample collected and sent to lab as ordered.  Medicated for pain as requested through the shift.  Tolerated all medications.

## 2021-01-21 NOTE — Progress Notes (Signed)
PROGRESS NOTE   Subjective/Complaints:  Pt woke up this AM- has at least 5cc of sanguinous drainage on chuck underneath him and dressing which was nonadherent soaked.  Also vomited x3.  Feels weak and "bad".  Compazine helped.   WBC 7.9- but still having fevers and elevated HR to 120s this evening.  Procalcitonin  high- his is 137.06-  Will start broad spectrum IV ABX and call IM consult immediately and ask pharmacy to dose ABX- and get resp nasal Cx.  Also get Urine and urine Cx-      ROS:  Pt denies feeling nauseated- just doesn't feel good- otherwise,  Pt denies SOB, abd pain, CP, N/V/C/D, and vision changes  Objective:   No results found. Recent Labs    01/21/21 0720  WBC 7.9  HGB 11.1*  HCT 31.8*  PLT 381   Recent Labs    01/21/21 0720  NA 132*  K 3.8  CL 95*  CO2 22  GLUCOSE 243*  BUN 16  CREATININE 1.18  CALCIUM 8.7*    Intake/Output Summary (Last 24 hours) at 01/21/2021 1853 Last data filed at 01/21/2021 1625 Gross per 24 hour  Intake 360 ml  Output 3150 ml  Net -2790 ml        Physical Exam: Vital Signs Blood pressure (!) 100/59, pulse 99, temperature 100.1 F (37.8 C), resp. rate 19, height 6\' 2"  (1.88 m), weight (!) 147.3 kg, SpO2 99 %.    General: awake, alert, appropriate, sitting up in bed; seen dressing/chuck for back- at least 5cc of not 10cc of sanguinous drainage, maybe more; appears pale; quiet; NAD HENT: conjugate gaze; oropharynx moist CV: regular rate this AM; ; no JVD Pulmonary: CTA B/L; no W/R/R- good air movement GI: soft, NT, protuberant vs distended, (+)BS- LBM 3 days ago- hypoactive Psychiatric: appropriate but flat Neurological: alert  Ext: no clubbing, cyanosis, or edema Psych: pleasant and cooperative  Skin: incision remains a little boggy- pt just vomited so couldn't turn over- showed me dressing-  Neuro:  Alert and oriented x 3. Normal insight and  awareness. Intact Memory. Normal language and speech. Cranial nerve exam unremarkable. UE motor 5/5. RLE limited by pain but 4 to 4+, LLE 4+ to 5. No focal sensory findings. DTR's 1+ Musculoskeletal: right hip adductors tender with palpation and with attempts at HAD. No focal spasm appreciated. Chronic OA changes right knee.   Assessment/Plan: 1. Functional deficits which require 3+ hours per day of interdisciplinary therapy in a comprehensive inpatient rehab setting. Physiatrist is providing close team supervision and 24 hour management of active medical problems listed below. Physiatrist and rehab team continue to assess barriers to discharge/monitor patient progress toward functional and medical goals  Care Tool:  Bathing    Body parts bathed by patient: Right arm, Left arm, Chest, Abdomen, Front perineal area, Right upper leg, Left upper leg   Body parts bathed by helper: Buttocks, Right lower leg, Left lower leg     Bathing assist Assist Level: Moderate Assistance - Patient 50 - 74%     Upper Body Dressing/Undressing Upper body dressing   What is the patient wearing?: Pull over shirt, Orthosis  Upper body assist Assist Level: Moderate Assistance - Patient 50 - 74%    Lower Body Dressing/Undressing Lower body dressing      What is the patient wearing?: Pants     Lower body assist Assist for lower body dressing: Total Assistance - Patient < 25%     Toileting Toileting    Toileting assist Assist for toileting: Moderate Assistance - Patient 50 - 74%     Transfers Chair/bed transfer  Transfers assist     Chair/bed transfer assist level: Moderate Assistance - Patient 50 - 74%     Locomotion Ambulation   Ambulation assist      Assist level: Minimal Assistance - Patient > 75% Assistive device: Walker-rolling Max distance: 24'   Walk 10 feet activity   Assist  Walk 10 feet activity did not occur: Safety/medical concerns  Assist level: Minimal  Assistance - Patient > 75% Assistive device: Walker-rolling   Walk 50 feet activity   Assist Walk 50 feet with 2 turns activity did not occur: N/A  Assist level: Minimal Assistance - Patient > 75% Assistive device: Walker-rolling    Walk 150 feet activity   Assist Walk 150 feet activity did not occur: N/A         Walk 10 feet on uneven surface  activity   Assist Walk 10 feet on uneven surfaces activity did not occur: N/A         Wheelchair     Assist Is the patient using a wheelchair?: No Type of Wheelchair: Manual    Wheelchair assist level: Total Assistance - Patient < 25% Max wheelchair distance: 10 ft    Wheelchair 50 feet with 2 turns activity    Assist        Assist Level: Total Assistance - Patient < 25%   Wheelchair 150 feet activity     Assist      Assist Level: Total Assistance - Patient < 25%   Blood pressure (!) 100/59, pulse 99, temperature 100.1 F (37.8 C), resp. rate 19, height 6\' 2"  (1.88 m), weight (!) 147.3 kg, SpO2 99 %. Medical Problem List and Plan: 1.  Impaired mobility s/p TLIF for lumbar radiculoapthy             -patient may shower but incision must be covered             -ELOS/Goals: 14 days              -Continue CIR therapies including PT, OT   10/10- Dr Cyndy Freeze came since Dr Mellody Drown in [place]- feels this is expected due to significance of surgery- con't PT and OT as tolerated for now 2.  Antithrombotics: -DVT/anticoagulation:  Pharmaceutical: Lovenox             -antiplatelet therapy: N/a 3. Pain Management: Continue oxycodone prn. .             --scheduled Zanaflex increased to 4 mg TID             --Continue Gabapentin 600 mg TID (PTA)  -continue kpad for right thigh/leg  -ms contin 15mg  q12 started Friday  10/10- pain tolerable- con't regimen 4. Mood: LCSW to follow for evaluation and support.              -antipsychotic agents: N/A 5. Neuropsych: This patient is capable of making decisions on his own  behalf. 6. Spinal incision: Monitor wound for healing. Routine pressure relief measures.              -  started MVI as well as juven to help promote wound healing.              -given bogginess and drainage, continue doxycycline -primary team requested NSGY to follow along.  10/10- NSU called and following-  7. Fluids/Electrolytes/Nutrition: Monitor I/O--encourage fluid intake. 8. HTN; Did have issues with significant hypotension post op--continue to hold BP meds for now. -- orthostatic vitals were ordered.  9. T2DM: Hgb A1C-9.0. Add CM restrictions. Renal status stable--continue to encourage fluid intake.               ---Monitor BS ac/hs with SSI for elevated BS.              --discontinued levemir and resume metformin.             CBG (last 3)  Recent Labs    01/20/21 2046 01/21/21 0624 01/21/21 1655  GLUCAP 183* 187* 262*   10/9 cbg's under better control today. May need second med or resumption of insulin. Observe for pattern  10/10- was more elevated today- added Glipizide 5 mg BID-AC- and will monitor- better BG control is good for wound healing.  10. Acute blood loss anemia: Monitor for signs of bleeding. Recheck CBC on 10/10 11. Acute on chronic CKD?: Will recheck CMET on 10/10.  12. OSA: Continue CPAP at nights.  13.  Elevated CK: Question rhabdo due to statin. Continue to hold Crestor and recheck CK on 01/21/21.  10/10- Cr 1.18- doing better- con't to monitor 14.  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   15. Infection?  10/10- procalcitonin 137! Will start of Cefipime and Vanc IV and cansult IM to help. Labs in AM 16. Hypomagnesemia  10/10- will give MgOx 400 mg BID and recheck this week 17. Constipation  10/10- LBM 3 days ago- could be cause of N/V this am x3- will give sorbitol x1 after therapy today.    I spent a total of 45 minutes on total care today- >50% coordination of care- speaking with pharmacy; PA; and nursing as well as NSU.     LOS: 3  days A FACE TO FACE EVALUATION WAS PERFORMED  Nasiah Lehenbauer 01/21/2021, 6:53 PM

## 2021-01-21 NOTE — Progress Notes (Signed)
Occupational Therapy Session Note  Patient Details  Name: [name] MRN: [id number] Date of Birth: [birth year]  Today's Date: 01/21/2021 OT Individual Time: 7142-3200 OT Individual Time Calculation (min): 50 min  and Today's Date: 01/21/2021 OT Missed Time: 10 Minutes Missed Time Reason: Pain   Short Term Goals: Week 1:  OT Short Term Goal 1 (Week 1): Pt will complete grooming task in standing with CGA. OT Short Term Goal 2 (Week 1): Pt will complete ambulatory toilet transfer with min A + LRAD. OT Short Term Goal 3 (Week 1): Pt will don pants with mod A + AE PRN.   Skilled Therapeutic Interventions/Progress Updates:    Pt greeted at time of session supine in bed resting with c/o back pain, unable to participate initially. Stating he just received pain meds, wanted to give them a few minutes to kick in. Repositioned bed level in partial side lying for pressure relief, returned 10 mins later to resume session. Supine > sit Max A for trunk elevation and noted posterior lean in sitting with several mild LOB with Mod A to recover. Stand pivot bed > wheelchair Min guard with RW and management of foley. UB dress Mod A overall for shirt to pull down in the back and total A for application of LSO. Donned shorts with total A including threading foley. Note significant pain limiting throughout session needing multiple rest breaks, breathing cues, and extended time for tasks. Set up in wheelchair for next therapy session in 45 mins. Alarm on call bell in reach and all needs met. Missed 10 mins at beginning of session d/t pain.   Therapy Documentation Precautions:  Precautions Precautions: Fall, Back Precaution Booklet Issued: No Precaution Comments: pt able to recall 3/3 back precautions with ind when prompted Required Braces or Orthoses: Spinal Brace Spinal Brace: Lumbar corset, Applied in sitting position Other Brace: for comfort, donned seated Restrictions Weight Bearing Restrictions:  No    Therapy/Group: Individual Therapy  Viona Gilmore 01/21/2021, 7:17 AM

## 2021-01-21 NOTE — Progress Notes (Signed)
Pt will yellow muse, elevated HR and temp, notified PA, New orders rec'd. Protocol in place. Notified nurse of new orders.

## 2021-01-21 NOTE — Progress Notes (Signed)
Patient ID: [name], male   DOB: [birth year], 55 y.o.   MRN: [id number] BP (!) 153/78 (BP Location: Right Arm)   Pulse (!) 104   Temp 98.8 F (37.1 C)   Resp 16   Ht 6\' 2"  (1.88 m) Comment: Simultaneous filing. User may not have seen previous data.  Wt (!) 147.3 kg   SpO2 97%   BMI 41.69 kg/m  Alert and oriented x 4, speech is clear and fluent Moving all extremities well Wound with sanguineous drainage Changed dressing and will follow. Given the scale of surgery I am not surprised. No signs of infection, afebrile.

## 2021-01-21 NOTE — Progress Notes (Addendum)
Patient ID: [name], male   DOB: [birth year], 55 y.o.   MRN: [id number]  Contacted to discuss [name] intermittent fevers and tachycardia. Discussed with Christian Chew, PA.  Briefly he is a 55 year old male with a history of diabetes hypertension hyperlipidemia OSA who presented on 9/34 planned L1-S1 decompression and fusion.  He had some postop complications with anemia, AKI and lower blood pressures.  Has been having intermittent fever and tachycardia more recently.  Patient has been evaluated by neurosurgery in terms of his wounds who do not believe there is any purulent drainage and the increased drainage at the incision site is expected in his case per the report.  No other reported symptoms of infection (bacterial or viral). Rehab provider states that they have discussed case with others and have started vancomycin and cefepime and are getting chest x-ray, urine cultures, urinalysis, blood cultures.  I agree with this work-up and did recommend giving IV fluid bolus as he has had some issues with blood pressure and his hemoglobin has improved from 9-11 in 3 days which likely represents a degree of hemoconcentration.  His tachycardia appears to be largely pain and fever driven at this point.  He currently does not have white count.  He does have elevated procalcitonin so as above agree with infectious work-up and covered with antibiotics as planned. Also noted is DVT study ordered which is reasonable given recent surgery  and possibility that DVT/PE, otherwise asymptomatic, could cause intermittent fevers (though will note patient is on DVT prophylaxis). Also recommended that neurosurgery be reconsulted if any change in his wound or vitals given he is already on antibiotic coverage and they may want specific imaging done if they want to evaluate for any infection related to surgery.  TRH not currently following, note is for information purposes only. I notified provider that we are available for full,  formal consult if needed, but provider stated that this is not required at this time.  Day provider for rehab states she will sign the case out to her nighttime colleagues as well.  Christian Grippe, DO [facility]

## 2021-01-21 NOTE — Progress Notes (Signed)
Inpatient Rehabilitation  Patient information reviewed and entered into eRehab system by Francie Keeling M. Telford Archambeau, M.A., CCC/SLP, PPS Coordinator.  Information including medical coding, functional ability and quality indicators will be reviewed and updated through discharge.    

## 2021-01-21 NOTE — Progress Notes (Signed)
Physical Therapy Session Note  Patient Details  Name: [name] MRN: [id number] Date of Birth: [birth year]  Today's Date: 01/21/2021 PT Individual Time: 1015-1130; 3888-2800 PT Individual Time Calculation (min): 75 min and 45 min PT Missed Time: 15 min Missed Time Reason: patient fatigue  Short Term Goals: Week 1:  PT Short Term Goal 1 (Week 1): Pt will perform bed mobility with CGA while adhering to back precautions. PT Short Term Goal 2 (Week 1): Pt will perform standing transfers with consistent CGA. PT Short Term Goal 3 (Week 1): Pt will ambulate at least 30 feet consistently with CGA and LRAD. PT Short Term Goal 4 (Week 1): Pt will initiate stair negotiation training. PT Short Term Goal 5 (Week 1): Pt will correctly don lumbar corset with supervision and min cues.  Skilled Therapeutic Interventions/Progress Updates:    Session 1: Pt received seated in w/c in room, agreeable to PT session. Attempted to obtain wider w/c for patient, 22" wide currently unavailable. Pt also tends to sit with posterior pelvic tilt and significant posterior trunk lean in sitting, would benefit from higher backed w/c as well for improved posture and positioning. Sit to stand with min A to RW throughout session. Ambulation x 35 ft, x 64 ft with RW and min A for balance, close w/c follow for safety. Pt initially exhibits increased gait speed and near LOB, with cues for decreased speed due to LE weakness pt exhibits improved balance in standing. Pt also requires cues to keep RW close to body during gait and when turning for increased safety. Pt reports increase in back pain in standing as well as LE feeling like "jelly", improves at rest. Pt also appears SOA during gait, improves at rest. Pt's back incision noted to be draining during session, nursing notified and able to perform dressing change. Following dressing change pt's incision again is draining and soils through his 2nd shirt, provided pt with disposable  hospital scrub top due to continued drainage. Pt is min A to change shirt while seated in chair. Pt reports urge to have a BM, ambulatory transfer into bathroom with RW and min A. Pt unable to void while on toilet, max A for clothing management and dependent for pericare. Pt left seated in recliner in room with needs in reach at end of session.  Session 2: Pt received supine in bed, agreeable to PT session. Pt reports ongoing pain in low back and in R groin, not rated and unable to receive pain medication during session. Pt also reports feeling fatigued this PM due to fever and as a side-effect of his pain medication. Supine to sit with max A needed for trunk elevation with use of bedrail, HOB elevated. Pt with significant posterior lean in sitting, requires max A to correct. Once pt scooted to EOB with RW for UE support he exhibits improved sitting balance and ability to remain upright. Sit to stand with mod A to RW this session. Ambulation 2 x 15 ft with RW and mod A for balance. Pt exhibits posterior lean in standing as well this PM, decreased awareness of balance deficits. Pt returned to supine with mod A needed for BLE management. Provided wedge under LE for improved comfort and positioning in bed. Pt left semi-reclined in bed with needs in reach, bed alarm in place. Pt missed 15 min of scheduled therapy session due to fatigue this PM.  Therapy Documentation Precautions:  Precautions Precautions: Fall, Back Precaution Booklet Issued: No Precaution Comments: pt able to  recall 3/3 back precautions with ind when prompted Required Braces or Orthoses: Spinal Brace Spinal Brace: Lumbar corset, Applied in sitting position Other Brace: for comfort, donned seated Restrictions Weight Bearing Restrictions: No    Therapy/Group: Individual Therapy   Excell Seltzer, PT, DPT, CSRS  01/21/2021, 5:11 PM

## 2021-01-21 NOTE — Progress Notes (Addendum)
Patient running low grade fever beginning of shift, 100.6. Tylenol administered for pain relief prior to vital signs being taken, temp down to 98.8. Patient also requesting PRN muscle relaxers to be added for breakthrough spasms during the night since they are scheduled.

## 2021-01-21 NOTE — Care Management (Signed)
[facility] Individual Statement of Services  Patient Name:  [name]  Date:  01/21/2021  Welcome to the [facility].  Our goal is to provide you with an individualized program based on your diagnosis and situation, designed to meet your specific needs.  With this comprehensive rehabilitation program, you will be expected to participate in at least 3 hours of rehabilitation therapies Monday-Friday, with modified therapy programming on the weekends.  Your rehabilitation program will include the following services:  Physical Therapy (PT), Occupational Therapy (OT), 24 hour per day rehabilitation nursing, Therapeutic Recreaction (TR), Psychology, Neuropsychology, Care Coordinator, Rehabilitation Medicine, Mobile, and Other  Weekly team conferences will be held on Tuesdays to discuss your progress.  Your Inpatient Rehabilitation Care Coordinator will talk with you frequently to get your input and to update you on team discussions.  Team conferences with you and your family in attendance may also be held.  Expected length of stay: 10-14 days    Overall anticipated outcome: Supervision  Depending on your progress and recovery, your program may change. Your Inpatient Rehabilitation Care Coordinator will coordinate services and will keep you informed of any changes. Your Inpatient Rehabilitation Care Coordinator's name and contact numbers are listed  below.  The following services may also be recommended but are not provided by the [facility] will be made to provide these services after discharge if needed.  Arrangements include referral to agencies that provide these services.  Your insurance has been verified to be:  [facility]  Your primary doctor is:  [name]  Pertinent  information will be shared with your doctor and your insurance company.  Inpatient Rehabilitation Care Coordinator:  Cathleen Corti [phone] or [phone]  Information discussed with and copy given to patient by: [name], 01/21/2021, 10:16 AM

## 2021-01-21 NOTE — Progress Notes (Signed)
PMR Admission Coordinator Pre-Admission Assessment  Patient: [name] is an 55 y.o., male MRN: [id number] DOB: Mar 28, 1966 Height: '6\' 2"'  (188 cm) (Simultaneous filing. User may not have seen previous data.) Weight: (!) 147.3 kg  Insurance Information HMO:     PPO:      PCP:      IPA:      80/20:      OTHER:  PRIMARY: [place]     Policy#: [id number]      Subscriber: pt CM Name: Jackelyn Poling      Phone#: [phone]     Fax#: [phone] Pre-Cert#: [id number] approved for 7 days after peer to peer appeal with f/u with Enis Slipper at phone [phone] ext [phone] fax [phone]   Employer:  Benefits:  Phone #: [phone]     Name: 01/17/21 Eff. Date: 04/14/2020     Deduct: none      Out of Pocket Max: $5000      Life Max: none CIR: $250 co pay per admit then insurance covers 80%       SNF: $250 co pay per admit thern 80%; 100 days Outpatient: 80%     Co-Pay: 100 combined visits Home Health: first 15 visits no charge then visits 16 until 100 80%.       Co-Pay: 100 combined visits DME: 80%     Co-Pay: 20% Providers: in network  SECONDARY: none      Financial Counselor:       Phone#:   [employer]" for patients in Inpatient Rehabilitation Facilities with attached "Privacy Act [place] Records" was provided and verbally reviewed with: N/A  Emergency Contact Information Contact Information     Name Relation Home Work [place] Spouse [phone]  [phone]   Garet, Hooton   [phone]       Current Medical History  Patient Admitting Diagnosis: Lumbar stenosis and radiculopathy  History of Present Illness: 55 year old male with medical history of DM2, DDD, HLD, HTN, obesity and OSA on CPAP, presented on 01/11/2021 for planned surgery . Has had complaints of low back pain and LE radicular pain secondary to severe canal stenosis and severe diffuse spondylosis. He underwent a L1-S1 decompression and instrumental  fusion on 01/11/21.   Postoperative with hypotension issues. PCCM consulted after rapid response was called on 10/2. Patient given IVF bolus and foley cath was placed. Noticeable Hgb drop from 12.7 to 9.8.Holding home antihypertensives and began IVF maintenance per PCCM recommendation.s Monitors Hgb trends. AKI with Crest elevation. Avoid nephrotoxins meds. Continue SSI and stop metformin. Postoperative incisional oozing monitoring with dressing changes.  Patient's medical record from [facility]  has been reviewed by the rehabilitation admission coordinator and physician.  Past Medical History  Past Medical History:  Diagnosis Date   DDD (degenerative disc disease), lumbar    Diabetes mellitus    Hyperlipemia    Hypertension    Obesity    OSA (obstructive sleep apnea)    on CPAP   Has the patient had major surgery during 100 days prior to admission? Yes  Family History   family history includes Diabetes in his mother.  Current Medications  Current Facility-Administered Medications:    acetaminophen (TYLENOL) tablet 325-650 mg, 325-650 mg, Oral, Q4H PRN, Love, Pamela S, PA-C, 650 mg at 01/20/21 1928   alum & mag hydroxide-simeth (MAALOX/MYLANTA) 200-200-20 MG/5ML suspension 30 mL, 30 mL, Oral, Q4H PRN, Love, Pamela S, PA-C   bisacodyl (DULCOLAX) suppository  10 mg, 10 mg, Rectal, Daily PRN, Love, Pamela S, PA-C   Chlorhexidine Gluconate Cloth 2 % PADS 6 each, 6 each, Topical, Daily, Lovorn, Megan, MD, 6 each at 01/21/21 0844   diclofenac Sodium (VOLTAREN) 1 % topical gel 2 g, 2 g, Topical, QID, Love, Pamela S, PA-C, 2 g at 01/21/21 0848   diphenhydrAMINE (BENADRYL) 12.5 MG/5ML elixir 12.5-25 mg, 12.5-25 mg, Oral, Q6H PRN, Love, Pamela S, PA-C   doxycycline (VIBRA-TABS) tablet 100 mg, 100 mg, Oral, Q12H, Love, Pamela S, PA-C, 100 mg at 01/21/21 0507   enoxaparin (LOVENOX) injection 40 mg, 40 mg, Subcutaneous, Q24H, Love, Pamela S, PA-C, 40 mg at 01/20/21 1759   gabapentin  (NEURONTIN) tablet 600 mg, 600 mg, Oral, TID, Love, Pamela S, PA-C, 600 mg at 01/21/21 0846   guaiFENesin-dextromethorphan (ROBITUSSIN DM) 100-10 MG/5ML syrup 5-10 mL, 5-10 mL, Oral, Q6H PRN, Love, Pamela S, PA-C   insulin aspart (novoLOG) injection 0-15 Units, 0-15 Units, Subcutaneous, TID WC, Love, Pamela S, PA-C, 3 Units at 01/21/21 0847   insulin aspart (novoLOG) injection 0-5 Units, 0-5 Units, Subcutaneous, QHS, Love, Pamela S, PA-C   lidocaine (XYLOCAINE) 2 % jelly, , Topical, PRN, Love, Pamela S, PA-C   magnesium oxide (MAG-OX) tablet 200 mg, 200 mg, Oral, BID, Love, Pamela S, PA-C, 200 mg at 01/21/21 0846   metFORMIN (GLUCOPHAGE) tablet 1,000 mg, 1,000 mg, Oral, BID WC, Love, Pamela S, PA-C, 1,000 mg at 01/21/21 0844   morphine (MS CONTIN) 12 hr tablet 15 mg, 15 mg, Oral, Q12H, Love, Pamela S, PA-C, 15 mg at 01/21/21 0507   multivitamin with minerals tablet 1 tablet, 1 tablet, Oral, Daily, Love, Pamela S, PA-C, 1 tablet at 01/21/21 0845   Muscle Rub CREA, , Topical, TID PRN, Love, Pamela S, PA-C   Muscle Rub CREA, , Topical, TID WC & HS, Pham, Minh Q, RPH-CPP, Given at 01/21/21 8416   nutrition supplement (JUVEN) (JUVEN) powder packet 1 packet, 1 packet, Oral, BID BM, Love, Pamela S, PA-C, 1 packet at 01/20/21 1755   oxyCODONE (Oxy IR/ROXICODONE) immediate release tablet 10 mg, 10 mg, Oral, Q4H PRN, Love, Pamela S, PA-C, 10 mg at 01/21/21 0844   polyethylene glycol (MIRALAX / GLYCOLAX) packet 17 g, 17 g, Oral, Daily, Love, Pamela S, PA-C, 17 g at 01/21/21 0845   polyethylene glycol (MIRALAX / GLYCOLAX) packet 17 g, 17 g, Oral, Daily PRN, Love, Pamela S, PA-C, 17 g at 01/20/21 0018   prochlorperazine (COMPAZINE) tablet 5-10 mg, 5-10 mg, Oral, Q6H PRN, 10 mg at 01/21/21 0701 **OR** prochlorperazine (COMPAZINE) injection 5-10 mg, 5-10 mg, Intramuscular, Q6H PRN **OR** prochlorperazine (COMPAZINE) suppository 12.5 mg, 12.5 mg, Rectal, Q6H PRN, Love, Pamela S, PA-C   sodium phosphate (FLEET) 7-19  GM/118ML enema 1 enema, 1 enema, Rectal, Once PRN, Love, Pamela S, PA-C   sorbitol 70 % solution 30 mL, 30 mL, Oral, Once, Lovorn, Megan, MD   tiZANidine (ZANAFLEX) tablet 4 mg, 4 mg, Oral, TID, Love, Pamela S, PA-C, 4 mg at 01/21/21 0846   traMADol (ULTRAM) tablet 50 mg, 50 mg, Oral, Q6H PRN, Love, Pamela S, PA-C, 50 mg at 01/20/21 1628   traZODone (DESYREL) tablet 25-50 mg, 25-50 mg, Oral, QHS PRN, Love, Pamela S, PA-C  Patients Current Diet:  Diet Order             Diet Carb Modified Fluid consistency: Thin; Room service appropriate? Yes  Diet effective now  Precautions / Restrictions Precautions Precautions: Fall, Back Precaution Booklet Issued: No Precaution Comments: pt able to recall 3/3 back precautions with ind when prompted Spinal Brace: Lumbar corset, Applied in sitting position Other Brace: for comfort, donned seated Restrictions Weight Bearing Restrictions: No   Has the patient had 2 or more falls or a fall with injury in the past year? No  Prior Activity Level    Prior Functional Level Self Care: Did the patient need help bathing, dressing, using the toilet or eating? Independent  Indoor Mobility: Did the patient need assistance with walking from room to room (with or without device)? Independent  Stairs: Did the patient need assistance with internal or external stairs (with or without device)? Independent  Functional Cognition: Did the patient need help planning regular tasks such as shopping or remembering to take medications? Independent  Patient Information    Patient's Response To:     Home Assistive Devices / Equipment Home Assistive Devices/Equipment: Eyeglasses, CBG Meter, Blood pressure cuff  Prior Device Use: Indicate devices/aids used by the patient prior to current illness, exacerbation or injury? None of the above  Current Functional Level Cognition  Arousal/Alertness: Awake/alert Overall Cognitive Status: Within  Functional Limits for tasks assessed Orientation Level: Oriented X4 Memory: Appears intact Awareness: Appears intact Problem Solving: Appears intact Safety/Judgment: Appears intact    Extremity Assessment (includes Sensation/Coordination)          ADLs       Mobility       Transfers       Ambulation / Gait / Stairs / Water engineer (Feet): 5 Feet Gait velocity: decreased    Posture / Balance Static Sitting Balance Static Sitting - Balance Support: Bilateral upper extremity supported, Feet supported Static Sitting - Level of Assistance: 5: Stand by assistance Dynamic Sitting Balance Dynamic Sitting - Balance Support: Bilateral upper extremity supported, Feet supported Dynamic Sitting - Level of Assistance: 4: Min assist Sitting balance - Comments: Requires at least 1 UE support on bed for support d/t pain and CGA with cues for maintaining back precautions Static Standing Balance Static Standing - Balance Support: Bilateral upper extremity supported, During functional activity Static Standing - Level of Assistance: 4: Min assist (CGA) Dynamic Standing Balance Dynamic Standing - Balance Support: Bilateral upper extremity supported, During functional activity Dynamic Standing - Level of Assistance: 4: Min assist (CGA) Balance Sitting balance - Comments: Requires at least 1 UE support on bed for support d/t pain and CGA with cues for maintaining back precautions    Special needs/care consideration Home CPAP   Previous Home Environment  Living Arrangements: Spouse/significant other, Children  Lives With: Spouse, Daughter Available Help at Discharge: Family, Available 24 hours/day Type of Home: House Home Layout: One level Home Access: Stairs to enter Entrance Stairs-Rails: None Entrance Stairs-Number of Steps: 3 Bathroom Shower/Tub: Chiropodist: Handicapped height Bathroom Accessibility: Yes How Accessible:  Accessible via walker [place]: No  Discharge Living Setting Does the patient have any problems obtaining your medications?: No  Social/Family/Support Systems    Goals    Decrease burden of Care through IP rehab admission: n/a  Possible need for SNF placement upon discharge: not anticipated  Patient Condition: I have reviewed medical records from [facility] , spoken with CM, and patient and spouse. I met with patient at the bedside for inpatient rehabilitation assessment.  Patient will benefit from ongoing PT and OT, can actively participate in 3 hours of therapy a  day 5 days of the week, and can make measurable gains during the admission.  Patient will also benefit from the coordinated team approach during an Inpatient Acute Rehabilitation admission.  The patient will receive intensive therapy as well as Rehabilitation physician, nursing, social worker, and care management interventions.  Due to bladder management, bowel management, safety, skin/wound care, disease management, medication administration, pain management, and patient education the patient requires 24 hour a day rehabilitation nursing.  The patient is currently mod assist overall  with mobility and basic ADLs.  Discharge setting and therapy post discharge at home with home health is anticipated.  Patient has agreed to participate in the Acute Inpatient Rehabilitation Program and will admit today.  Preadmission Screen Completed By:  Cleatrice Burke, 01/21/2021 11:06 AM ______________________________________________________________________   Discussed status with Dr. Ranell Patrick on 01/18/2021 at 1053 and received approval for admission today.  Admission Coordinator:  Cleatrice Burke, RN, time 5747 Date 01/18/2021   Assessment/Plan: Diagnosis: Lumbar radiculopathy Does the need for close, 24 hr/day Medical supervision in concert with the patient's rehab needs make it unreasonable for this patient to  be served in a less intensive setting? Yes Co-Morbidities requiring supervision/potential complications: Urinary retention, severe muscle spasm, orthostasis, controlled type 2 diabetes mellitus, hyperlipidemia Due to bladder management, bowel management, safety, skin/wound care, disease management, medication administration, pain management, and patient education, does the patient require 24 hr/day rehab nursing? Yes Does the patient require coordinated care of a physician, rehab nurse, PT, OT to address physical and functional deficits in the context of the above medical diagnosis(es)? Yes Addressing deficits in the following areas: balance, endurance, locomotion, strength, transferring, bowel/bladder control, bathing, dressing, feeding, grooming, toileting, and psychosocial support Can the patient actively participate in an intensive therapy program of at least 3 hrs of therapy 5 days a week? Yes The potential for patient to make measurable gains while on inpatient rehab is excellent Anticipated functional outcomes upon discharge from inpatient rehab: modified independent PT, modified independent OT, independent SLP Estimated rehab length of stay to reach the above functional goals is: 14 days Anticipated discharge destination: Home 10. Overall Rehab/Functional Prognosis: excellent   MD Signature: Leeroy Cha, MD

## 2021-01-21 NOTE — Progress Notes (Signed)
Discussed patient with Dr. Dagoberto Ligas who recommended starting broad spectrum antibiotics and contacting [facility] for input. Discussed patient with Dr. Dierdre Searles ([facility]) who felt that work up ordered was appropriate and to bolus with a liter of NS as patient likely getting dehydrated. He's available till 1 pm prn. Also to have NS give opinion if patient declines or does not respond to current interventions.

## 2021-01-21 NOTE — IPOC Note (Signed)
Overall Plan of Care [facility]) Patient Details Name: [name] MRN: [id number] DOB: March 24, 1966  Admitting Diagnosis: Lumbar radiculopathy  Hospital Problems: Principal Problem:   Lumbar radiculopathy     Functional Problem List: Nursing Bowel, Endurance, Medication Management, Pain, Perception, Safety, Sensory, Skin Integrity, Bladder  PT Balance, Endurance, Motor, Pain, Safety, Sensory, Skin Integrity  OT Pain, Balance, Motor, Endurance  SLP    TR         Basic ADL's: OT Grooming, Bathing, Dressing, Toileting     Advanced  ADL's: OT Simple Meal Preparation     Transfers: PT Bed Mobility, Bed to Chair, Car, Manufacturing systems engineer, Metallurgist: PT Ambulation, Stairs     Additional Impairments: OT None  SLP        TR      Anticipated Outcomes Item Anticipated Outcome  Self Feeding ind  Swallowing      Basic self-care  mod I  Toileting  mod I   Bathroom Transfers mod I toilet, S shower  Bowel/Bladder  supervision  Transfers  Mod I/ supervision  Locomotion  supervision  Communication     Cognition     Pain  <3  Safety/Judgment  Supervision and no falls   Therapy Plan: PT Intensity: Minimum of 1-2 x/day ,45 to 90 minutes PT Frequency: 5 out of 7 days PT Duration Estimated Length of Stay: 10-14 days OT Intensity: Minimum of 1-2 x/day, 45 to 90 minutes OT Frequency: 5 out of 7 days OT Duration/Estimated Length of Stay: 10 to 14 days     Due to the current state of emergency, patients may not be receiving their 3-hours of Medicare-mandated therapy.   Team Interventions: Nursing Interventions Patient/Family Education, Bowel Management, Bladder Management, Disease Management/Prevention, Pain Management, Medication Management, Skin Care/Wound Management, Discharge Planning  PT interventions Ambulation/gait training, Community reintegration, DME/adaptive equipment instruction, Neuromuscular re-education, Psychosocial support, Stair  training, UE/LE Strength taining/ROM, Wheelchair propulsion/positioning, UE/LE Coordination activities, Therapeutic Exercise, Patient/family education, Functional mobility training, Disease management/prevention, Cognitive remediation/compensation, Training and development officer, Discharge planning, Functional electrical stimulation, Pain management, Skin care/wound management, Therapeutic Activities  OT Interventions Training and development officer, Community reintegration, Disease mangement/prevention, Neuromuscular re-education, Patient/family education, Self Care/advanced ADL retraining, Therapeutic Exercise, UE/LE Coordination activities, Wheelchair propulsion/positioning, UE/LE Strength taining/ROM, Therapeutic Activities, Psychosocial support, Pain management, Functional mobility training, DME/adaptive equipment instruction, Discharge planning, Cognitive remediation/compensation  SLP Interventions    TR Interventions    SW/CM Interventions Discharge Planning, Psychosocial Support, Patient/Family Education   Barriers to Discharge MD  Medical stability, Home enviroment access/loayout, Wound care, Weight, and Weight bearing restrictions  Nursing Decreased caregiver support, Home environment access/layout, Neurogenic Bowel & Bladder, Incontinence, Wound Care, Lack of/limited family support, Weight, Weight bearing restrictions, Medication compliance Lives in 1 level home with 3 steps to enter and no rails. Lives with spouse who works at [employer]. Spouse available to provide 24/7 assist at discharge.  PT Inaccessible home environment, Decreased caregiver support, Home environment access/layout, Wound Care, Lack of/limited family support, Insurance for SNF coverage, Weight, Medication compliance Pain is pt's largest limiting factor in progress  OT Other (comments) pain  SLP      SW       Team Discharge Planning: Destination: PT-Home ,OT- Home , SLP-  Projected Follow-up: PT-Outpatient PT, Home health  PT, OT-  Outpatient OT, SLP-  Projected Equipment Needs: PT-To be determined, OT- 3 in 1 bedside comode, Tub/shower bench, SLP-  Equipment Details: PT- , OT-  Patient/family involved in discharge planning:  PT- Patient,  OT-Patient, SLP-   MD ELOS: 10-14 days Medical Rehab Prognosis:  Good Assessment: Pt is a 55 yr old male s/p lumbar extensive surgery/decompression/fusion-  Post op had bad hypotension; now having severe drainage _hematoma?; also having fevers and MEWS yellow due to elevated HR and elevated procalcitonin- calling IM to help with possible Infection.    Goals mod I to supervision.     See Team Conference Notes for weekly updates to the plan of care

## 2021-01-21 NOTE — Progress Notes (Addendum)
Pharmacy Antibiotic Note  [name] is a 55 y.o. male admitted on 9/30 with back pain for planned laminectomies, to CIR on 10/7. Now with concern for post-op wound infection. Pharmacy has been consulted for Vancomycin + Ceftriaxone dosing.  Allergy to PCN noted but has tolerated Cefazolin earlier this admission. SCr has fluctuated this admission - will monitor closely on Vancomycin.   Plan: - Vancomycin 2500 mg IV x 1 followed by 1250 mg IV every 12 hours (eAUC 478, SCr 1.18, Vd 0.5) - Ceftriaxone 2g IV every 24 hours - BMET in the AM - Will continue to follow renal function, culture results, LOT, and antibiotic de-escalation plans   Height: 6\' 2"  (188 cm) (Simultaneous filing. User may not have seen previous data.) Weight: (!) 147.3 kg (324 lb 11.8 oz) IBW/kg (Calculated) : 82.2  Temp (24hrs), Avg:99.8 F (37.7 C), Min:98.8 F (37.1 C), Max:100.6 F (38.1 C)  Recent Labs  Lab 01/15/21 0602 01/21/21 0720  WBC  --  7.9  CREATININE 0.98 1.18    Estimated Creatinine Clearance: 105.7 mL/min (by C-G formula based on SCr of 1.18 mg/dL).    Allergies  Allergen Reactions   Cymbalta [Duloxetine Hcl] Other (See Comments)   Penicillins Other (See Comments)    REACTION: Blister and Sores in the mouth    Antimicrobials this admission: Vanc 10/10 >> Ceftriaxone 10/10 >>  Dose adjustments this admission:   Microbiology results: 10/10 UCx >> 10/10 BCx >> 10/10 WCx (superficial) >>  Thank you for allowing pharmacy to be a part of this patient's care.  Alycia Rossetti, PharmD, BCPS Clinical Pharmacist Clinical phone for 01/21/2021: [phone] 01/21/2021 6:56 PM   **Pharmacist phone directory can now be found on [email or web address] ([email or web address]).  Listed under [facility].

## 2021-01-22 ENCOUNTER — Ambulatory Visit (HOSPITAL_BASED_OUTPATIENT_CLINIC_OR_DEPARTMENT_OTHER): Payer: 59

## 2021-01-22 ENCOUNTER — Inpatient Hospital Stay (HOSPITAL_COMMUNITY): Payer: 59

## 2021-01-22 DIAGNOSIS — R509 Fever, unspecified: Secondary | ICD-10-CM | POA: Insufficient documentation

## 2021-01-22 LAB — GLUCOSE, CAPILLARY
Glucose-Capillary: 174 mg/dL — ABNORMAL HIGH (ref 70–99)
Glucose-Capillary: 188 mg/dL — ABNORMAL HIGH (ref 70–99)
Glucose-Capillary: 164 mg/dL — ABNORMAL HIGH (ref 70–99)
Glucose-Capillary: 108 mg/dL — ABNORMAL HIGH (ref 70–99)

## 2021-01-22 LAB — BLOOD CULTURE ID PANEL (REFLEXED) - BCID2

## 2021-01-22 LAB — CBC WITH DIFFERENTIAL/PLATELET
Abs Immature Granulocytes: 0.09 10*3/uL — ABNORMAL HIGH (ref 0.00–0.07)
Basophils Absolute: 0 10*3/uL (ref 0.0–0.1)
Basophils Relative: 0 %
Eosinophils Absolute: 0.1 10*3/uL (ref 0.0–0.5)
Eosinophils Relative: 1 %
HCT: 24.5 % — ABNORMAL LOW (ref 39.0–52.0)
Hemoglobin: 8.4 g/dL — ABNORMAL LOW (ref 13.0–17.0)
Immature Granulocytes: 1 %
Lymphocytes Relative: 4 %
Lymphs Abs: 0.4 10*3/uL — ABNORMAL LOW (ref 0.7–4.0)
MCH: 30.2 pg (ref 26.0–34.0)
MCHC: 34.3 g/dL (ref 30.0–36.0)
MCV: 88.1 fL (ref 80.0–100.0)
Monocytes Absolute: 0.6 10*3/uL (ref 0.1–1.0)
Monocytes Relative: 6 %
Neutro Abs: 8.3 10*3/uL — ABNORMAL HIGH (ref 1.7–7.7)
Neutrophils Relative %: 88 %
Platelets: 205 10*3/uL (ref 150–400)
RBC: 2.78 MIL/uL — ABNORMAL LOW (ref 4.22–5.81)
RDW: 13.5 % (ref 11.5–15.5)
WBC: 9.4 10*3/uL (ref 4.0–10.5)
nRBC: 0 % (ref 0.0–0.2)

## 2021-01-22 LAB — SEDIMENTATION RATE: Sed Rate: 63 mm/hr — ABNORMAL HIGH (ref 0–16)

## 2021-01-22 LAB — HEPATIC FUNCTION PANEL
ALT: 23 U/L (ref 0–44)
AST: 22 U/L (ref 15–41)
Albumin: 2.4 g/dL — ABNORMAL LOW (ref 3.5–5.0)
Alkaline Phosphatase: 72 U/L (ref 38–126)
Bilirubin, Direct: 0.3 mg/dL — ABNORMAL HIGH (ref 0.0–0.2)
Indirect Bilirubin: 0.8 mg/dL (ref 0.3–0.9)
Total Bilirubin: 1.1 mg/dL (ref 0.3–1.2)
Total Protein: 5.3 g/dL — ABNORMAL LOW (ref 6.5–8.1)

## 2021-01-22 LAB — URINE CULTURE
Culture: NO GROWTH
Special Requests: NORMAL

## 2021-01-22 LAB — BASIC METABOLIC PANEL
Anion gap: 7 (ref 5–15)
BUN: 21 mg/dL — ABNORMAL HIGH (ref 6–20)
CO2: 25 mmol/L (ref 22–32)
Calcium: 7.8 mg/dL — ABNORMAL LOW (ref 8.9–10.3)
Chloride: 96 mmol/L — ABNORMAL LOW (ref 98–111)
Creatinine, Ser: 1.05 mg/dL (ref 0.61–1.24)
GFR, Estimated: >60 mL/min (ref >60)
Glucose, Bld: 174 mg/dL — ABNORMAL HIGH (ref 70–99)
Potassium: 3.5 mmol/L (ref 3.5–5.1)
Sodium: 128 mmol/L — ABNORMAL LOW (ref 135–145)

## 2021-01-22 LAB — PROCALCITONIN: Procalcitonin: 124.5 ng/mL

## 2021-01-22 LAB — C-REACTIVE PROTEIN: CRP: 19.4 mg/dL — ABNORMAL HIGH (ref <1.0)

## 2021-01-22 MED ORDER — METHOCARBAMOL 750 MG PO TABS
750.0000 mg | ORAL_TABLET | Freq: Three times a day (TID) | ORAL | Status: DC | PRN
  Administered 2021-01-23 – 2021-01-24 (×4): 750 mg via ORAL
  Filled 2021-01-22 (×5): qty 1

## 2021-01-22 MED ORDER — IOHEXOL 300 MG/ML  SOLN
80.0000 mL | Freq: Once | INTRAMUSCULAR | Status: AC | PRN
  Administered 2021-01-22: 80 mL via INTRAVENOUS

## 2021-01-22 MED ORDER — SODIUM CHLORIDE 0.9 % IV SOLN: INTRAVENOUS | Status: DC

## 2021-01-22 MED ORDER — MAGNESIUM SULFATE 2 GM/50ML IV SOLN
2.0000 g | Freq: Once | INTRAVENOUS | Status: AC
  Administered 2021-01-22: 2 g via INTRAVENOUS
  Filled 2021-01-22: qty 50

## 2021-01-22 MED ORDER — FLEET ENEMA 7-19 GM/118ML RE ENEM: 1.0000 | ENEMA | Freq: Once | RECTAL | Status: DC

## 2021-01-22 NOTE — Progress Notes (Signed)
Inpatient Rehabilitation Care Coordinator Assessment and Plan Patient Details  Name: [name] MRN: [id number] Date of Birth: 04-Mar-1966  Today's Date: 01/22/2021  Hospital Problems: Principal Problem:   Lumbar radiculopathy  Past Medical History:  Past Medical History:  Diagnosis Date   DDD (degenerative disc disease), lumbar    Diabetes mellitus    Hyperlipemia    Hypertension    Obesity    OSA (obstructive sleep apnea)    on CPAP   Past Surgical History:  Past Surgical History:  Procedure Laterality Date   APPLICATION OF ROBOTIC ASSISTANCE FOR SPINAL PROCEDURE N/A 01/11/2021   Procedure: APPLICATION OF ROBOTIC ASSISTANCE FOR SPINAL PROCEDURE;  Surgeon: Judith Part, MD;  Location: [place];  Service: Neurosurgery;  Laterality: N/A;   BACK SURGERY     COLONOSCOPY  2019   TRANSFORAMINAL LUMBAR INTERBODY FUSION (TLIF) WITH PEDICLE SCREW FIXATION 4 LEVEL N/A 01/11/2021   Procedure: Lumbar one-two, Lumbar two-three, Lumbar three-four, Lumbar four-five, Lumbar five Sacral one Open decompression, Transforaminal lumbar interbody fusion, posterolateral instrumented fusion;  Surgeon: Judith Part, MD;  Location: [place];  Service: Neurosurgery;  Laterality: N/A;   Social History:  reports that he has never smoked. He has never used smokeless tobacco. He reports that he does not drink alcohol and does not use drugs.  Family / Support Systems Marital Status: Married How Long?: 13 years Patient Roles: Spouse, Parent Spouse/Significant Other: [name]; wife works at [employer] in [place]: 46 adn 66 year old daughters. Other Supports: Brother Anticipated Caregiver: Pt brother lives in the home Ability/Limitations of Caregiver: Pt wife works during the day; brother will provide supervision during the day Caregiver Availability: 24/7 Family Dynamics: Pt lives with wife, two daughters, and brother  Social History Preferred language: English Religion: Mormon Cultural  Background: Pt works in Designer, television/film set, Psychiatrist. Education: 8th grade Health Literacy - How often do you need to have someone help you when you read instructions, pamphlets, or other written material from your doctor or pharmacy?: Rarely Writes: Yes Employment Status: Employed Name of Employer: [employer] Length of Employment: 38.5 (years) Return to Work Plans: TBD. Pt reports his Short term Disability began 08/2020 and he is currently wihtin long term disability. Legal History/Current Legal Issues: Denies Guardian/Conservator: N/A   Abuse/Neglect Abuse/Neglect Assessment Can Be Completed: Yes Physical Abuse: Denies Verbal Abuse: Denies Sexual Abuse: Denies Exploitation of patient/patient's resources: Denies Self-Neglect: Denies  Patient response to: Social Isolation - How often do you feel lonely or isolated from those around you?: Never  Emotional Status Pt's affect, behavior and adjustment status: Pt in good spirits at time of visit Recent Psychosocial Issues: Denies Psychiatric History: Denies Substance Abuse History: Denies  Patient / Family Perceptions, Expectations & Goals Pt/Family understanding of illness & functional limitations: Pt and wife have a general understanding of patient care needs Premorbid pt/family roles/activities: Independent Anticipated changes in roles/activities/participation: Assistance with ADLs/IADLs Pt/family expectations/goals: Pt goal is "getting back to walking, and getting up and walk like I should."  [employer]: None Premorbid Home Care/DME Agencies: None Transportation available at discharge: Wife Is the patient able to respond to transportation needs?: Yes In the past 12 months, has lack of transportation kept you from medical appointments or from getting medications?: No In the past 12 months, has lack of transportation kept you from meetings, work, or from getting things needed for daily  living?: No Resource referrals recommended: Neuropsychology  Discharge Planning Living Arrangements: Spouse/significant other, Children, Other  relatives Support Systems: Spouse/significant other, Children, Other relatives Type of Residence: Private residence Insurance Resources: Multimedia programmer (specify) Pharmacologist) Financial Resources: Employment, Secondary school teacher Screen Referred: No Living Expenses: Medical laboratory scientific officer Management: Patient, Spouse Does the patient have any problems obtaining your medications?: No Home Management: Wife manages all homecare needs Patient/Family Preliminary Plans: No changes Care Coordinator Anticipated Follow Up Needs: HH/OP  Clinical Impression SW met with pt in room to introduce self, explain role, and discuss discharge process. Pt is not a English as a second language teacher. No HCPOA. No DME. Pt aware SW will f/u with his wife.   *SW spoke with pt wife to introduce self and explain role. D/c to home with support from his brother has primary caregiver, and his wife in evening.   [name] 01/22/2021, 9:41 PM

## 2021-01-22 NOTE — Progress Notes (Signed)
Occupational Therapy Session Note  Patient Details  Name: JAQUARIOUS GREY MRN: [id number] Date of Birth: Apr 20, 1965  Today's Date: 01/22/2021 OT Individual Time: 1130-1158 OT Individual Time Calculation (min): 28 min    Short Term Goals: Week 1:  OT Short Term Goal 1 (Week 1): Pt will complete grooming task in standing with CGA. OT Short Term Goal 2 (Week 1): Pt will complete ambulatory toilet transfer with min A + LRAD. OT Short Term Goal 3 (Week 1): Pt will don pants with mod A + AE PRN.  Skilled Therapeutic Interventions/Progress Updates:    Pt resting in bed upon arrival. Pt had slid down in bed and feet were hanging over side of bed. Pt repositioned in bed with tot A+2. OT intervention with focus on myofascial release for Rt thigh and inner thigh with relief noted. Discussed home bathroom setup and education. Pt remained in bed with all needs within reach.   Therapy Documentation Precautions:  Precautions Precautions: Fall, Back Precaution Booklet Issued: No Precaution Comments: pt able to recall 3/3 back precautions with ind when prompted Required Braces or Orthoses: Spinal Brace Spinal Brace: Lumbar corset, Applied in sitting position Other Brace: for comfort, donned seated Restrictions Weight Bearing Restrictions: No Pain: Pt reports 5/10 pain Rt inner thigh and Rt lower back; myofascial release and repositioning   Therapy/Group: Individual Therapy  [name] 01/22/2021, 12:03 PM

## 2021-01-22 NOTE — Progress Notes (Signed)
PROGRESS NOTE   Subjective/Complaints:  Pt reports no N/V since yesterday evening around 4pm (per PA, was heaving around 6pm).  Poor sleep due to pain and abd cramping.  KUB looks OK. Tc 100.1 this AM per pt.  Needs to have BM still- hasn't gone.  Also having R inner thigh muscle cramping and R low back.  Muscles tight.   K+ 3.5; Mg 1.4; WBC 9.4 on IV ABX;    ROS:   Pt denies SOB, abd pain, CP, N/V/C/D, and vision changes   Objective:   DG CHEST PORT 1 VIEW  Result Date: 01/21/2021 CLINICAL DATA:  Fever EXAM: PORTABLE CHEST 1 VIEW COMPARISON:  CT chest 07/18/2013 FINDINGS: Prominent cardiac silhouette due to AP portable technique. The heart and mediastinal contours are within normal limits. No focal consolidation. No pulmonary edema. No pleural effusion. No pneumothorax. No acute osseous abnormality. IMPRESSION: No active disease. Electronically Signed   By: Iven Finn M.D.   On: 01/21/2021 19:42   DG Abd Portable 1V  Result Date: 01/21/2021 CLINICAL DATA:  fever EXAM: PORTABLE ABDOMEN - 1 VIEW COMPARISON:  x-ray abdomen 01/18/2021 FINDINGS: Markedly limited evaluation due to overlapping osseous structures and overlying soft tissues. Lumbosacral surgical hardware again noted. The bowel gas pattern is normal. No radio-opaque calculi or other significant radiographic abnormality are seen. IMPRESSION: Nonobstructive bowel gas pattern. Electronically Signed   By: Iven Finn M.D.   On: 01/21/2021 19:43   Recent Labs    01/21/21 0720 01/22/21 0510  WBC 7.9 9.4  HGB 11.1* 8.4*  HCT 31.8* 24.5*  PLT 381 205   Recent Labs    01/21/21 0720 01/22/21 0510  NA 132* 128*  K 3.8 3.5  CL 95* 96*  CO2 22 25  GLUCOSE 243* 174*  BUN 16 21*  CREATININE 1.18 1.05  CALCIUM 8.7* 7.8*    Intake/Output Summary (Last 24 hours) at 01/22/2021 1040 Last data filed at 01/22/2021 0700 Gross per 24 hour  Intake 720 ml   Output 4575 ml  Net -3855 ml        Physical Exam: Vital Signs Blood pressure (!) 121/58, pulse (!) 112, temperature 98.3 F (36.8 C), temperature source Oral, resp. rate 18, height 6\' 2"  (1.88 m), weight (!) 147.3 kg, SpO2 96 %.     General: awake, alert, appropriate, laying in bed; turned with PT who's in room; NAD HENT: conjugate gaze; oropharynx moist CV: regular rhythm, but tachycardic rate; no JVD Pulmonary: CTA B/L; no W/R/R- good air movement GI: soft, NT, protuberant vs distended; normoactive BS Psychiatric: appropriate; depressed affect Neurological: alert   Ext: no clubbing, cyanosis, or edema Psych: pleasant and cooperative  Skin: incision puffy/boggy- esp on upper aspect B/L; honeycomb dressing soaked/saturated with dark maroon blood.  Neuro:  Alert and oriented x 3. Normal insight and awareness. Intact Memory. Normal language and speech. Cranial nerve exam unremarkable. UE motor 5/5. RLE limited by pain but 4 to 4+, LLE 4+ to 5. No focal sensory findings. DTR's 1+ Musculoskeletal: right hip adductors tender with palpation - tight on palpation.  Chronic OA changes right knee.   Assessment/Plan: 1. Functional deficits which require 3+ hours per day  of interdisciplinary therapy in a comprehensive inpatient rehab setting. Physiatrist is providing close team supervision and 24 hour management of active medical problems listed below. Physiatrist and rehab team continue to assess barriers to discharge/monitor patient progress toward functional and medical goals  Care Tool:  Bathing    Body parts bathed by patient: Right arm, Left arm, Chest, Abdomen, Front perineal area, Right upper leg, Left upper leg   Body parts bathed by helper: Buttocks, Right lower leg, Left lower leg     Bathing assist Assist Level: Moderate Assistance - Patient 50 - 74%     Upper Body Dressing/Undressing Upper body dressing   What is the patient wearing?: Pull over shirt, Orthosis     Upper body assist Assist Level: Moderate Assistance - Patient 50 - 74%    Lower Body Dressing/Undressing Lower body dressing      What is the patient wearing?: Pants     Lower body assist Assist for lower body dressing: Total Assistance - Patient < 25%     Toileting Toileting    Toileting assist Assist for toileting: Moderate Assistance - Patient 50 - 74%     Transfers Chair/bed transfer  Transfers assist     Chair/bed transfer assist level: Moderate Assistance - Patient 50 - 74%     Locomotion Ambulation   Ambulation assist      Assist level: Minimal Assistance - Patient > 75% Assistive device: Walker-rolling Max distance: 45'   Walk 10 feet activity   Assist  Walk 10 feet activity did not occur: Safety/medical concerns  Assist level: Minimal Assistance - Patient > 75% Assistive device: Walker-rolling   Walk 50 feet activity   Assist Walk 50 feet with 2 turns activity did not occur: N/A  Assist level: Minimal Assistance - Patient > 75% Assistive device: Walker-rolling    Walk 150 feet activity   Assist Walk 150 feet activity did not occur: N/A         Walk 10 feet on uneven surface  activity   Assist Walk 10 feet on uneven surfaces activity did not occur: N/A         Wheelchair     Assist Is the patient using a wheelchair?: No Type of Wheelchair: Manual    Wheelchair assist level: Total Assistance - Patient < 25% Max wheelchair distance: 10 ft    Wheelchair 50 feet with 2 turns activity    Assist        Assist Level: Total Assistance - Patient < 25%   Wheelchair 150 feet activity     Assist      Assist Level: Total Assistance - Patient < 25%   Blood pressure (!) 121/58, pulse (!) 112, temperature 98.3 F (36.8 C), temperature source Oral, resp. rate 18, height 6\' 2"  (1.88 m), weight (!) 147.3 kg, SpO2 96 %. Medical Problem List and Plan: 1.  Impaired mobility s/p TLIF for lumbar radiculoapthy              -patient may shower but incision must be covered             -ELOS/Goals: 14 days              -Continue CIR therapies including PT, OT   10/11- con't PT and OT/CIR- team conference today to determine length of stay.  2.  Antithrombotics: -DVT/anticoagulation:  Pharmaceutical: Lovenox             -antiplatelet therapy: N/a 3. Pain Management: Continue oxycodone prn. .             --  scheduled Zanaflex increased to 4 mg TID             --Continue Gabapentin 600 mg TID (PTA)  -continue kpad for right thigh/leg  -ms contin 15mg  q12 started Friday  10/11- pain- still tight- esp R inner thigh adductors- and R low back- will add Robaxin 750 mg TID Prn.  4. Mood: LCSW to follow for evaluation and support.              -antipsychotic agents: N/A 5. Neuropsych: This patient is capable of making decisions on his own behalf. 6. Spinal incision: Monitor wound for healing. Routine pressure relief measures.              -started MVI as well as juven to help promote wound healing.              -given bogginess and drainage, continue doxycycline -primary team requested NSGY to follow along.  10/10- NSU called and following-   10/11- started IV ABX last night- not sure what we are treating? 7. Fluids/Electrolytes/Nutrition: Monitor I/O--encourage fluid intake. 8. HTN; Did have issues with significant hypotension post op--continue to hold BP meds for now. -- orthostatic vitals were ordered.  9. T2DM: Hgb A1C-9.0. Add CM restrictions. Renal status stable--continue to encourage fluid intake.               ---Monitor BS ac/hs with SSI for elevated BS.              --discontinued levemir and resume metformin.             CBG (last 3)  Recent Labs    01/21/21 1655 01/21/21 2127 01/22/21 0604  GLUCAP 262* 183* 174*   10/9 cbg's under better control today. May need second med or resumption of insulin. Observe for pattern  10/10- was more elevated today- added Glipizide 5 mg BID-AC- and will monitor-  better BG control is good for wound healing.   10/11- BG's looking better 10. Acute blood loss anemia: Monitor for signs of bleeding. Recheck CBC on 10/10 11. Acute on chronic CKD?: Will recheck CMET on 10/10.  12. OSA: Continue CPAP at nights.  13.  Elevated CK: Question rhabdo due to statin. Continue to hold Crestor and recheck CK on 01/21/21.  10/10- Cr 1.18- doing better- con't to monitor 14.  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   15. Infection?  10/10- procalcitonin 137! Will start of Cefipime and Vanc IV and cansult IM to help. Labs in AM  10/11- Procalcitonin- reduced to 124- will monitor and con't IV ABX- IM didn't feel anything else to do.  16. Hypomagnesemia  10/10- will give MgOx 400 mg BID and recheck this week  10/11- DO IV Mg to help and recheck.  17. Constipation  10/10- LBM 3 days ago- could be cause of N/V this am x3- will give sorbitol x1 after therapy today.   10/11- no BM- will give enema to get cleaned out.    I spent a total of 40 minutes- >50% on coordination of care- team conference, and d/w wife and pt and PT about care.    LOS: 4 days A FACE TO FACE EVALUATION WAS PERFORMED  Liron Eissler 01/22/2021, 10:40 AM

## 2021-01-22 NOTE — Progress Notes (Addendum)
Physical Therapy Session Note  Patient Details  Name: [name] MRN: [id number] Date of Birth: 04-Nov-1965  Today's Date: 01/22/2021 PT Individual Time: 1157-2620 PT Individual Time Calculation (min): 41 min  Missed therapy time:  19 min. Short Term Goals: Week 1:  PT Short Term Goal 1 (Week 1): Pt will perform bed mobility with CGA while adhering to back precautions. PT Short Term Goal 2 (Week 1): Pt will perform standing transfers with consistent CGA. PT Short Term Goal 3 (Week 1): Pt will ambulate at least 30 feet consistently with CGA and LRAD. PT Short Term Goal 4 (Week 1): Pt will initiate stair negotiation training. PT Short Term Goal 5 (Week 1): Pt will correctly don lumbar corset with supervision and min cues.  Skilled Therapeutic Interventions/Progress Updates: Pt presents supine in bed and states increased cramps to R LE.  Lengthened bed for comfort per pt request.  Pt states need for BM.  Pt transfers sidelying to sit at EOB using siderail and min A.  Pt requires verbal cueing for log roll technique for spinal precautions.  Pt sat EOB for dressing change to back incisions.  Pt required min A to don L/S corset.  Pt transferred sit to stand w/ min A from elevated bed height.  Pt amb x 12' w/ RW into BR w/ min A.  Verbal cues for safety especially w/ ramped entrance to BR maintaining position w/in RW.   Pt required min A for eccentric transfer to raised  toilet seat.  Pt continent of bowel and bladder, NT to chart.  Pt amb 12' to bed w/ RW.  Pt returned to L sidelying w/ modA for LEs.  Pt scheduled for dopplers to Les, session truncated and pt left supine in bed w/ all needs in reach.     Therapy Documentation Precautions:  Precautions Precautions: Fall, Back Precaution Booklet Issued: No Precaution Comments: pt able to recall 3/3 back precautions with ind when prompted Required Braces or Orthoses: Spinal Brace Spinal Brace: Lumbar corset, Applied in sitting position Other  Brace: for comfort, donned seated Restrictions Weight Bearing Restrictions: No General: PT Amount of Missed Time (min): 19 Minutes (dopplers for LE) PT Missed Treatment Reason: Other (Comment) (pt to have dopplers LEs r/o DVT.) Vital Signs: Therapy Vitals Temp: 99.9 F (37.7 C) Temp Source: Oral Pulse Rate: (!) 105 Resp: 20 BP: (!) 103/53 Patient Position (if appropriate): Lying Oxygen Therapy SpO2: 94 % O2 Device: Room Air Pain: Pain Assessment Pain Scale: 0-10 Pain Score: 3  Mobility:   Locomotion :    Trunk/Postural Assessment :    Balance:   Exercises:   Other Treatments:      Therapy/Group: Individual Therapy  [name] 01/22/2021, 1:58 PM

## 2021-01-22 NOTE — Progress Notes (Signed)
Lower extremity venous has been completed.   Preliminary results in CV Proc.   Jinny Blossom Nickalaus Crooke 01/22/2021 2:40 PM

## 2021-01-22 NOTE — Progress Notes (Signed)
Updated Dr. Christella Noa on positive BC. As back incision with clear drainage that back not felt to be the source of infection--imaging not indicated at this time. Patient noted to have BP trending down--will start IVF for hydration as BUN trending up.

## 2021-01-22 NOTE — Progress Notes (Signed)
Occupational Therapy Session Note  Patient Details  Name: [name] MRN: [id number] Date of Birth: 18-Dec-1965  Today's Date: 01/22/2021 OT Individual Time: 1032-1100 OT Individual Time Calculation (min): 28 min  Missed 32 minutes as pt reports not feeling well declining participation at that time    Short Term Goals: Week 1:  OT Short Term Goal 1 (Week 1): Pt will complete grooming task in standing with CGA. OT Short Term Goal 2 (Week 1): Pt will complete ambulatory toilet transfer with min A + LRAD. OT Short Term Goal 3 (Week 1): Pt will don pants with mod A + AE PRN.  Skilled Therapeutic Interventions/Progress Updates:  Pt greeted seated in recliner with RN presenting reporting that pt presents with low grade fever, pt trembling reporting having the "chills". RN starting antibiotics- exited room for 32 minutes to allow meds to be ran and pt requesting rest at this time. Returned to assist pt to [facility] as Rn needed to provide enema. Pt completed stand pivot transfer from recliner>BSC with Rw with MODA to rise into standing and MIN A to pivot to BSC. Pt requested privacy to void bowels; OTA and Rn exited room with pt on BSC, call bell within reach.                        Therapy Documentation Precautions:  Precautions Precautions: Fall, Back Precaution Booklet Issued: No Precaution Comments: pt able to recall 3/3 back precautions with ind when prompted Required Braces or Orthoses: Spinal Brace Spinal Brace: Lumbar corset, Applied in sitting position Other Brace: for comfort, donned seated Restrictions Weight Bearing Restrictions: No  Pain: pt reports pain from general malaise provided rest breaks as needed during session   Therapy/Group: Individual Therapy  Corinne Ports [facility] 01/22/2021, 11:59 AM

## 2021-01-22 NOTE — Consult Note (Signed)
[facility]                                                                                        Patient Identification: Patient Name: [name] MRN: [id number] [place] Date: 01/18/2021 11:52 AM Today's Date: 01/22/2021 Reason for consult: GN bacateremia  Requesting provider: Courtney Heys   Principal Problem:   Lumbar radiculopathy   Antibiotics: Doxycyline 10/7-10/10                    Vancomycin 10/10-current                     Ceftriaxone 10/10-current                        Lines/Hardwares: Lumbar hardware  Assessment # Klebsiella pneumoniae bacteremia, High grade  UA unremarkable. No abdominal symptoms except mild nausea. No lines.Possiblly related to Lumbar wound  Paient and RN tells me he has been having seorus drainage from his back for last few days associated with fevers, chills and nausea  # Recent TLIF with screws/hardware  Recommendations  Antobiotics has been appropriately de-escalated to ceftriaxone Fu sensitivities  CT abdomen/pelvis w contrast  to look for intraabdominal source and also lumbar spine NeuroSurgery evaluation  Monitor CBC and CMP  Rest of the management as per the primary team. Please call with questions or concerns.  Thank you for the consult  Rosiland Oz, MD Infectious Disease Physician [facility] [address] [place], [place] [address] Phone: [phone]  Fax: [phone]  __________________________________________________________________________________________________________ HPI and Hospital Course: [name] is a 55 Year old male with PMH of DM, HLD, HTN, Obesity/OSA and DDD s/p recent TLIFwith pedicel screw fixation on 9/30 after which he was discharged to Rehab. He was doing well post open when he started having low grade fevers starting 10/2 afebrile theeafter with  febrile episodes starting again from 10/9. Blood cultures 10/10 growing GNR in 4/4 bottles and BCID identified as Klebsiella pneumoniae. UA unremarkable for UTI. He tells me he has been having fevers, chills and increased drainage from surgical site at the back. Denies any abdominal symptoms or GU symptoms except nausea. He is ambulatory. Denies any hardware in his body except in his L spine.   ROS: General- Denies loss of appetite and loss of weight, fevers and chills + HEENT - Denies headache, blurry vision, neck pain, sinus pain Chest - Denies any chest pain, SOB or cough CVS- Denies any dizziness/lightheadedness, syncopal attacks, palpitations Abdomen- Denies any vomiting, abdominal pain, hematochezia and diarrhea, Nausea + Neuro - Denies any weakness, numbness, tingling sensation Psych - Denies any changes in mood irritability or depressive symptoms GU- Denies any burning, dysuria, hematuria or increased frequency of urination Skin - denies any rashes/lesions MSK - denies any joint pain/swelling or restricted ROM   Past Medical History:  Diagnosis Date   DDD (degenerative disc disease), lumbar    Diabetes mellitus    Hyperlipemia    Hypertension    Obesity    OSA (obstructive sleep apnea)  on CPAP   Past Surgical History:  Procedure Laterality Date   APPLICATION OF ROBOTIC ASSISTANCE FOR SPINAL PROCEDURE N/A 01/11/2021   Procedure: APPLICATION OF ROBOTIC ASSISTANCE FOR SPINAL PROCEDURE;  Surgeon: Judith Part, MD;  Location: [place];  Service: Neurosurgery;  Laterality: N/A;   BACK SURGERY     COLONOSCOPY  2019   TRANSFORAMINAL LUMBAR INTERBODY FUSION (TLIF) WITH PEDICLE SCREW FIXATION 4 LEVEL N/A 01/11/2021   Procedure: Lumbar one-two, Lumbar two-three, Lumbar three-four, Lumbar four-five, Lumbar five Sacral one Open decompression, Transforaminal lumbar interbody fusion, posterolateral instrumented fusion;  Surgeon: Judith Part, MD;  Location: [place];  Service:  Neurosurgery;  Laterality: N/A;    Scheduled Meds:  Chlorhexidine Gluconate Cloth  6 each Topical Daily   diclofenac Sodium  2 g Topical QID   enoxaparin (LOVENOX) injection  40 mg Subcutaneous Q24H   gabapentin  400 mg Oral TID AC & HS   glipiZIDE  5 mg Oral BID AC   insulin aspart  0-15 Units Subcutaneous TID WC   insulin aspart  0-5 Units Subcutaneous QHS   magnesium oxide  200 mg Oral BID   metFORMIN  1,000 mg Oral BID WC   morphine  15 mg Oral Q12H   multivitamin with minerals  1 tablet Oral Daily   Muscle Rub   Topical TID WC & HS   nutrition supplement (JUVEN)  1 packet Oral BID BM   pantoprazole (PROTONIX) IV  40 mg Intravenous Q24H   polyethylene glycol  17 g Oral Daily   Ensure Max Protein  11 oz Oral BID   sodium phosphate  1 enema Rectal Once   tiZANidine  4 mg Oral TID   Continuous Infusions:  cefTRIAXone (ROCEPHIN)  IV 2 g (01/21/21 2221)   magnesium sulfate bolus IVPB 2 g (01/22/21 1225)   PRN Meds:.acetaminophen, alum & mag hydroxide-simeth, bisacodyl, diphenhydrAMINE, guaiFENesin-dextromethorphan, lidocaine, methocarbamol, Muscle Rub, oxyCODONE, polyethylene glycol, prochlorperazine **OR** prochlorperazine **OR** prochlorperazine, traMADol, traZODone  Allergies  Allergen Reactions   Cymbalta [Duloxetine Hcl] Other (See Comments)   Penicillins Other (See Comments)    REACTION: Blister and Sores in the mouth   Social History   Socioeconomic History   Marital status: Married    Spouse name: [name]   Number of children: 2   Years of education: Not on file   Highest education level: Not on file  Occupational History   Occupation: Therapist, art: [employer]   Occupation: Music therapist: [employer]  Tobacco Use   Smoking status: Never   Smokeless tobacco: Never  Scientific laboratory technician Use: Never used  Substance and Sexual Activity   Alcohol use: No   Drug use: No   Sexual activity: Yes  Other Topics Concern   Not on  file  Social History Narrative   Remarried for the 3rd time w/ a lady for the [place]   Children:  2010 , 2012   Left handed                Social Determinants of Health   Financial Resource Strain: Not on file  Food Insecurity: Not on file  Transportation Needs: Not on file  Physical Activity: Not on file  Stress: Not on file  Social Connections: Not on file  Intimate Partner Violence: Not on file   Family History  Problem Relation Age of Onset   Diabetes Mother    Hypertension Neg Hx  Coronary artery disease Neg Hx    Colon cancer Neg Hx    Prostate cancer Neg Hx      Vitals BP (!) 103/53 (BP Location: Left Arm)   Pulse (!) 105   Temp 99.9 F (37.7 C) (Oral)   Resp 20   Ht 6\' 2"  (1.88 m) Comment: Simultaneous filing. User may not have seen previous data.  Wt (!) 147.3 kg   SpO2 94%   BMI 41.69 kg/m    Physical Exam Constitutional:  Lying in bed, not in acute distress    Comments:   Cardiovascular:     Rate and Rhythm: Normal rate and regular rhythm.     Heart sounds:   Pulmonary:     Effort: Pulmonary effort is normal.     Comments: clear air entry bilaterally   Abdominal:     Palpations: Abdomen is soft.     Tenderness: Non tender and non distended   Musculoskeletal:        General: No swelling or tenderness.   Skin:    Comments: bandage at mid back with some soakage and mild tenderness  Neurological:     General: No focal deficit present.   Psychiatric:        Mood and Affect: Mood normal.    Pertinent Microbiology Results for orders placed or performed during the hospital encounter of 01/18/21  Urine Culture     Status: None   Collection Time: 01/21/21  4:06 PM   Specimen: Urine, Catheterized  Result Value Ref Range Status   Specimen Description URINE, CATHETERIZED  Final   Special Requests Normal  Final   Culture   Final    NO GROWTH Performed at [facility], [address] [place], [place] [address]    Report  Status 01/22/2021 FINAL  Final  Culture, blood (routine x 2)     Status: None (Preliminary result)   Collection Time: 01/21/21  4:22 PM   Specimen: BLOOD  Result Value Ref Range Status   Specimen Description BLOOD RIGHT ANTECUBITAL  Final   Special Requests   Final    BOTTLES DRAWN AEROBIC AND ANAEROBIC Blood Culture results may not be optimal due to an excessive volume of blood received in culture bottles   Culture  Setup Time   Final    GRAM NEGATIVE RODS IN BOTH AEROBIC AND ANAEROBIC BOTTLES CRITICAL RESULT CALLED TO, READ BACK BY AND VERIFIED WITH: PHARMD M PHAN [id number] AT 65 BY CM Performed at [facility], [place] [address], [place],  [address]    Culture GRAM NEGATIVE RODS  Final   Report Status PENDING  Incomplete  Blood Culture ID Panel (Reflexed)     Status: Abnormal (Preliminary result)   Collection Time: 01/21/21  4:22 PM  Result Value Ref Range Status   Enterococcus faecalis NOT DETECTED NOT DETECTED Final   Enterococcus Faecium NOT DETECTED NOT DETECTED Final   Listeria monocytogenes NOT DETECTED NOT DETECTED Final   Staphylococcus species NOT DETECTED NOT DETECTED Final   Staphylococcus aureus (BCID) NOT DETECTED NOT DETECTED Final   Staphylococcus epidermidis NOT DETECTED NOT DETECTED Final   Staphylococcus lugdunensis NOT DETECTED NOT DETECTED Final   Streptococcus species NOT DETECTED NOT DETECTED Final   Streptococcus agalactiae NOT DETECTED NOT DETECTED Final   Streptococcus pneumoniae NOT DETECTED NOT DETECTED Final   Streptococcus pyogenes NOT DETECTED NOT DETECTED Final   A.calcoaceticus-baumannii NOT DETECTED NOT DETECTED Final   Bacteroides fragilis NOT DETECTED NOT DETECTED Final  Enterobacterales PENDING NOT DETECTED Incomplete   Enterobacter cloacae complex NOT DETECTED NOT DETECTED Final   Escherichia coli NOT DETECTED NOT DETECTED Final   Klebsiella aerogenes NOT DETECTED NOT DETECTED Final   Klebsiella oxytoca NOT DETECTED NOT DETECTED  Final   Klebsiella pneumoniae DETECTED (A) NOT DETECTED Final    Comment: CRITICAL RESULT CALLED TO, READ BACK BY AND VERIFIED WITH: PHARMD M PHAN [id number] AT 12580 BY CM    Proteus species NOT DETECTED NOT DETECTED Final   Salmonella species NOT DETECTED NOT DETECTED Final   Serratia marcescens NOT DETECTED NOT DETECTED Final   Haemophilus influenzae NOT DETECTED NOT DETECTED Final   Neisseria meningitidis NOT DETECTED NOT DETECTED Final   Pseudomonas aeruginosa NOT DETECTED NOT DETECTED Final   Stenotrophomonas maltophilia NOT DETECTED NOT DETECTED Final   Candida albicans NOT DETECTED NOT DETECTED Final   Candida auris NOT DETECTED NOT DETECTED Final   Candida glabrata NOT DETECTED NOT DETECTED Final   Candida krusei NOT DETECTED NOT DETECTED Final   Candida parapsilosis NOT DETECTED NOT DETECTED Final   Candida tropicalis NOT DETECTED NOT DETECTED Final   Cryptococcus neoformans/gattii NOT DETECTED NOT DETECTED Final   CTX-M ESBL NOT DETECTED NOT DETECTED Final   Carbapenem resistance IMP NOT DETECTED NOT DETECTED Final   Carbapenem resistance KPC NOT DETECTED NOT DETECTED Final   Carbapenem resistance NDM NOT DETECTED NOT DETECTED Final   Carbapenem resist OXA 48 LIKE NOT DETECTED NOT DETECTED Final   Carbapenem resistance VIM NOT DETECTED NOT DETECTED Final    Comment: Performed at [facility], [address], [place], [place] [address]  Culture, blood (routine x 2)     Status: None (Preliminary result)   Collection Time: 01/21/21  4:23 PM   Specimen: BLOOD  Result Value Ref Range Status   Specimen Description BLOOD RIGHT ANTECUBITAL  Final   Special Requests   Final    BOTTLES DRAWN AEROBIC AND ANAEROBIC Blood Culture results may not be optimal due to an excessive volume of blood received in culture bottles   Culture  Setup Time   Final    GRAM NEGATIVE RODS IN BOTH AEROBIC AND ANAEROBIC BOTTLES Performed at [facility], [place] [address], [place], [place]  [address]    Culture GRAM NEGATIVE RODS  Final   Report Status PENDING  Incomplete    Pertinent Lab seen by me: CBC Latest Ref Rng & Units 01/22/2021 01/21/2021 01/18/2021  WBC 4.0 - 10.5 K/uL 9.4 7.9 -  Hemoglobin 13.0 - 17.0 g/dL 8.4(L) 11.1(L) 8.8(L)  Hematocrit 39.0 - 52.0 % 24.5(L) 31.8(L) 26.0(L)  Platelets 150 - 400 K/uL 205 381 -   CMP Latest Ref Rng & Units 01/22/2021 01/21/2021 01/15/2021  Glucose 70 - 99 mg/dL 174(H) 243(H) 224(H)  BUN 6 - 20 mg/dL 21(H) 16 13  Creatinine 0.61 - 1.24 mg/dL 1.05 1.18 0.98  Sodium 135 - 145 mmol/L 128(L) 132(L) 131(L)  Potassium 3.5 - 5.1 mmol/L 3.5 3.8 3.4(L)  Chloride 98 - 111 mmol/L 96(L) 95(L) 101  CO2 22 - 32 mmol/L 25 22 24   Calcium 8.9 - 10.3 mg/dL 7.8(L) 8.7(L) 7.7(L)  Total Protein 6.5 - 8.1 g/dL 5.3(L) 6.6 -  Total Bilirubin 0.3 - 1.2 mg/dL 1.1 1.4(H) -  Alkaline Phos 38 - 126 U/L 72 80 -  AST 15 - 41 U/L 22 22 -  ALT 0 - 44 U/L 23 27 -     Pertinent Imagings/Other Imagings Plain films and CT images  have been personally visualized and interpreted; radiology reports have been reviewed. Decision making incorporated into the Impression / Recommendations.  Abdomen Xray 01/20/21 FINDINGS: Markedly limited evaluation due to overlapping osseous structures and overlying soft tissues.   Lumbosacral surgical hardware again noted.   The bowel gas pattern is normal. No radio-opaque calculi or other significant radiographic abnormality are seen.   IMPRESSION: Nonobstructive bowel gas pattern.  Chest Xray 01/20/21 FINDINGS: Prominent cardiac silhouette due to AP portable technique. The heart and mediastinal contours are within normal limits.   No focal consolidation. No pulmonary edema. No pleural effusion. No pneumothorax.   No acute osseous abnormality.   IMPRESSION: No active disease.  I spent more than 70  minutes for this patient encounter including review of prior medical records/discussing diagnostics and treatment plan  with the patient/family/coordinate care with primary/other specialits with greater than 50% of time in face to face encounter.   Electronically signed by:   Rosiland Oz, MD Infectious Disease Physician [facility] for Infectious Disease Pager: [phone]

## 2021-01-22 NOTE — Patient Care Conference (Signed)
Inpatient RehabilitationTeam Conference and Plan of Care Update Date: 01/22/2021   Time: 11:14 AM    Patient Name: [name]      Medical Record Number: [id number]  Date of Birth: 02-11-1966 Sex: Male         Room/Bed: 4W16C/4W16C-01 Payor Info: Payor: Theme park manager / Plan: [employer] OTHER / Product Type: *No Product type* /    Admit Date/Time:  01/18/2021 11:52 AM  Primary Diagnosis:  Lumbar radiculopathy  Hospital Problems: Principal Problem:   Lumbar radiculopathy    Expected Discharge Date: Expected Discharge Date: 02/04/21  Team Members Present: Physician leading conference: Dr. Courtney Heys Social Worker Present: Loralee Pacas, [place] Nurse Present: Dorthula Nettles, RN PT Present: Excell Seltzer, PT OT Present: Roanna Epley, COTA;Jennifer Tamala Julian, OT PPS Coordinator present : Gunnar Fusi, SLP     Current Status/Progress Goal Weekly Team Focus  Bowel/Bladder   foley, cont of bowel  remain cont of bowel  assess q shift and prn   Swallow/Nutrition/ Hydration             ADL's   bathing-mod A: UB dressng-mod A: LB dressing-tot A: functional transfers-min A  supervision/mod I overall  BADL training, functional transfers, education, activity tolerance   Mobility   max A bed mobility, mod A transfer with RW, min A gait x 70 ft  Supervision to mod I; CGA stairs  bed mobility, transfers, gait   Communication             Safety/Cognition/ Behavioral Observations            Pain   7 out of 10 lower back  pain below 5  assess q 4 hours and prn   Skin   inicison lower back with moderate drainage  patient will remain free of infection  assess q shift and change dressing     Discharge Planning:  D/c to home with his family. BIL will be with him during the day; wife home in the evening since she works during the day as a Quarry manager.   Team Discussion: Had nausea/vomiting yesterday. Abdominal cramping, KUB good. Will have a Fleets enema tonight. Labs low. CBG's  good. Continent bowel, discontinue Foley later this week. Pain is not controlled. Blister is draining, incision draining copious amounts. On Vancomycin and Cefepime. Waked about 70 ft.   Patient on target to meet rehab goals: yes, mod/mas assist bed mobility, mod assist with RW, min assist transfers. Supervision/mod I goals.  *See Care Plan and progress notes for long and short-term goals.   Revisions to Treatment Plan:  Adjusting medications, monitor daily labs, discontinue foley end of week, enema for bowels. Teaching Needs: Family education, medication management, pain management, skin/wound care, bowel/bladder management, weight bearing precautions, transfer training, gait training, balance training, endurance training, safety awareness.  Current Barriers to Discharge: Decreased caregiver support, Medical stability, Home enviroment access/layout, IV antibiotics, New diabetic, Incontinence, Neurogenic bowel and bladder, Wound care, Lack of/limited family support, Weight, Weight bearing restrictions, Medication compliance, and Nutritional means  Possible Resolutions to Barriers: Family education with spouse and brother. Continue current medications, monitor daily labs, teach weight bearing precautions to family, skin/wound care.     Medical Summary Current Status: Mg 1.4; K+ 3.5;   , Na 128; incision copious sanguinous  drainage- needs changing- has foley- due to [place] retention;  pain uncontrolled- on IV Vanc and Cefipime; muscle spasms; LBM 10/7-10/9?  Barriers to Discharge: Decreased family/caregiver support;Home enviroment access/layout;Neurogenic Bowel & Bladder;Medical stability;IV antibiotics;New diabetic;Weight;Weight  bearing restrictions;Wound care  Barriers to Discharge Comments: lots of drainage; less N/V; Possible Resolutions to Barriers/Weekly Focus: will replete with IV mg; try to d/c foley at end of week; enema for Bowels; added robaxin for spasms; d/c 10/24 due to medical  issues   Continued Need for Acute Rehabilitation Level of Care: The patient requires daily medical management by a physician with specialized training in physical medicine and rehabilitation for the following reasons: Direction of a multidisciplinary physical rehabilitation program to maximize functional independence : Yes Medical management of patient stability for increased activity during participation in an intensive rehabilitation regime.: Yes Analysis of laboratory values and/or radiology reports with any subsequent need for medication adjustment and/or medical intervention. : Yes   I attest that I was present, lead the team conference, and concur with the assessment and plan of the team.   Cristi Loron 01/22/2021, 5:37 PM

## 2021-01-22 NOTE — Progress Notes (Signed)
PHARMACY - PHYSICIAN COMMUNICATION CRITICAL VALUE ALERT - BLOOD CULTURE IDENTIFICATION (BCID)  [name] is an 55 y.o. male who presented to [facility] on 01/18/2021 with a chief complaint of lumbar radiculopathy.   Assessment: Pt was admitted for lumbar radiculopathy with recent laminectomies. His blood cultures came back with 4/4 bottles with klebsiella pneumoniaes without resistant genes. D/w with CIR and will optimize to ceftriaxone.   Name of physician (or Provider) Contacted: Algis Liming, PA  Current antibiotics: Vanc/ceftriaxone  Changes to prescribed antibiotics recommended:  Ceftriaxone 2g IV qday Dc vanc  Results for orders placed or performed during the hospital encounter of 01/18/21  Blood Culture ID Panel (Reflexed) (Collected: 01/21/2021  4:22 PM)  Result Value Ref Range   Enterococcus faecalis NOT DETECTED NOT DETECTED   Enterococcus Faecium NOT DETECTED NOT DETECTED   Listeria monocytogenes NOT DETECTED NOT DETECTED   Staphylococcus species NOT DETECTED NOT DETECTED   Staphylococcus aureus (BCID) NOT DETECTED NOT DETECTED   Staphylococcus epidermidis NOT DETECTED NOT DETECTED   Staphylococcus lugdunensis NOT DETECTED NOT DETECTED   Streptococcus species NOT DETECTED NOT DETECTED   Streptococcus agalactiae NOT DETECTED NOT DETECTED   Streptococcus pneumoniae NOT DETECTED NOT DETECTED   Streptococcus pyogenes NOT DETECTED NOT DETECTED   A.calcoaceticus-baumannii NOT DETECTED NOT DETECTED   Bacteroides fragilis NOT DETECTED NOT DETECTED   Enterobacterales PENDING NOT DETECTED   Enterobacter cloacae complex NOT DETECTED NOT DETECTED   Escherichia coli NOT DETECTED NOT DETECTED   Klebsiella aerogenes NOT DETECTED NOT DETECTED   Klebsiella oxytoca NOT DETECTED NOT DETECTED   Klebsiella pneumoniae DETECTED (A) NOT DETECTED   Proteus species NOT DETECTED NOT DETECTED   Salmonella species NOT DETECTED NOT DETECTED   Serratia marcescens NOT DETECTED NOT DETECTED    Haemophilus influenzae NOT DETECTED NOT DETECTED   Neisseria meningitidis NOT DETECTED NOT DETECTED   Pseudomonas aeruginosa NOT DETECTED NOT DETECTED   Stenotrophomonas maltophilia NOT DETECTED NOT DETECTED   Candida albicans NOT DETECTED NOT DETECTED   Candida auris NOT DETECTED NOT DETECTED   Candida glabrata NOT DETECTED NOT DETECTED   Candida krusei NOT DETECTED NOT DETECTED   Candida parapsilosis NOT DETECTED NOT DETECTED   Candida tropicalis NOT DETECTED NOT DETECTED   Cryptococcus neoformans/gattii NOT DETECTED NOT DETECTED   CTX-M ESBL NOT DETECTED NOT DETECTED   Carbapenem resistance IMP NOT DETECTED NOT DETECTED   Carbapenem resistance KPC NOT DETECTED NOT DETECTED   Carbapenem resistance NDM NOT DETECTED NOT DETECTED   Carbapenem resist OXA 48 LIKE NOT DETECTED NOT DETECTED   Carbapenem resistance VIM NOT DETECTED NOT DETECTED    Onnie Boer, PharmD, BCIDP, AAHIVP, CPP Infectious Disease Pharmacist 01/22/2021 1:11 PM

## 2021-01-22 NOTE — Progress Notes (Signed)
Patient ID: [name], male   DOB: 07/25/1965, 55 y.o.   MRN: [id number]  SW met with pt in room to provide updates from team conference, and d/c date 10/24. Pt aware SW to f/u with his wife.   SW spoke with his wife [name] to provide updates from team conference. SW informed there will be f/u after team conference.   Loralee Pacas, MSW, [place] Office: [phone] Cell: [phone] Fax: [phone]

## 2021-01-22 NOTE — Progress Notes (Signed)
Patient appears to be controlling pain well.  Did not request for pain medication today between long lasting morphine.  Fleets enema administered with good results this shift.  Tolerated IV ABT as scheduled with no adverse reaction noted.  Surgical back wound copiously saturated bed pad through clothing.  Wound care performed and applied honeycomb dressing to site. Foley remains patent and intact with amber color clear urine. Patient is scheduled for U/S of Abd/Pelvis. 2 bottles of contrast given and tolerated well.  Started on IV fluid hydration of 0.9% via peripheral IV. VSS at this time.

## 2021-01-22 NOTE — Progress Notes (Signed)
Physical Therapy Session Note  Patient Details  Name: [name] MRN: [id number] Date of Birth: 02-21-1966  Today's Date: 01/22/2021 PT Individual Time: 0800-0900 PT Individual Time Calculation (min): 60 min   Short Term Goals: Week 1:  PT Short Term Goal 1 (Week 1): Pt will perform bed mobility with CGA while adhering to back precautions. PT Short Term Goal 2 (Week 1): Pt will perform standing transfers with consistent CGA. PT Short Term Goal 3 (Week 1): Pt will ambulate at least 30 feet consistently with CGA and LRAD. PT Short Term Goal 4 (Week 1): Pt will initiate stair negotiation training. PT Short Term Goal 5 (Week 1): Pt will correctly don lumbar corset with supervision and min cues.  Skilled Therapeutic Interventions/Progress Updates:    Pt received supine in bed, agreeable to PT session. Pt reports 7/10 pain in R lower back and R hip adductor/groin region. Nursing able to provide pain medication during session and pt has muscle rub applied to painful regions. Assisted pt with changing honeycomb dressing to back incision due to dressing being soaked through with drainage. Pt reports not sleeping well overnight due to gas and pain. Obtained TIS chair for improved back support in sitting as pt tends to sit in posterior pelvic tilt. Supine to sit with mod A for trunk elevation, use of bedrail and HOB slightly elevated. Pt exhibits improved ability to perform transfer and improved sitting balance EOB this date. Sit to stand with min to mod A to RW during session. Ambulation 2 x 50 ft in patient room with RW and min A for balance. Pt exhibits improved balance especially with turns this date. Pt left seated in recliner in room with needs in reach at end of session.  Therapy Documentation Precautions:  Precautions Precautions: Fall, Back Precaution Booklet Issued: No Precaution Comments: pt able to recall 3/3 back precautions with ind when prompted Required Braces or Orthoses: Spinal  Brace Spinal Brace: Lumbar corset, Applied in sitting position Other Brace: for comfort, donned seated Restrictions Weight Bearing Restrictions: No    Therapy/Group: Individual Therapy  Excell Seltzer, PT, DPT, CSRS  01/22/2021, 1:35 PM

## 2021-01-23 ENCOUNTER — Inpatient Hospital Stay (HOSPITAL_COMMUNITY): Payer: 59

## 2021-01-23 LAB — BASIC METABOLIC PANEL
Anion gap: 10 (ref 5–15)
BUN: 16 mg/dL (ref 6–20)
CO2: 25 mmol/L (ref 22–32)
Calcium: 7.9 mg/dL — ABNORMAL LOW (ref 8.9–10.3)
Chloride: 95 mmol/L — ABNORMAL LOW (ref 98–111)
Creatinine, Ser: 0.96 mg/dL (ref 0.61–1.24)
GFR, Estimated: >60 mL/min (ref >60)
Glucose, Bld: 120 mg/dL — ABNORMAL HIGH (ref 70–99)
Potassium: 3.4 mmol/L — ABNORMAL LOW (ref 3.5–5.1)
Sodium: 130 mmol/L — ABNORMAL LOW (ref 135–145)

## 2021-01-23 LAB — CBC WITH DIFFERENTIAL/PLATELET
Abs Immature Granulocytes: 0.05 10*3/uL (ref 0.00–0.07)
Basophils Absolute: 0 10*3/uL (ref 0.0–0.1)
Basophils Relative: 0 %
Eosinophils Absolute: 0.1 10*3/uL (ref 0.0–0.5)
Eosinophils Relative: 2 %
HCT: 23.7 % — ABNORMAL LOW (ref 39.0–52.0)
Hemoglobin: 8.1 g/dL — ABNORMAL LOW (ref 13.0–17.0)
Immature Granulocytes: 1 %
Lymphocytes Relative: 7 %
Lymphs Abs: 0.4 10*3/uL — ABNORMAL LOW (ref 0.7–4.0)
MCH: 30.5 pg (ref 26.0–34.0)
MCHC: 34.2 g/dL (ref 30.0–36.0)
MCV: 89.1 fL (ref 80.0–100.0)
Monocytes Absolute: 0.6 10*3/uL (ref 0.1–1.0)
Monocytes Relative: 11 %
Neutro Abs: 4.5 10*3/uL (ref 1.7–7.7)
Neutrophils Relative %: 79 %
Platelets: 200 10*3/uL (ref 150–400)
RBC: 2.66 MIL/uL — ABNORMAL LOW (ref 4.22–5.81)
RDW: 13.3 % (ref 11.5–15.5)
WBC: 5.7 10*3/uL (ref 4.0–10.5)
nRBC: 0 % (ref 0.0–0.2)

## 2021-01-23 LAB — GLUCOSE, CAPILLARY
Glucose-Capillary: 121 mg/dL — ABNORMAL HIGH (ref 70–99)
Glucose-Capillary: 217 mg/dL — ABNORMAL HIGH (ref 70–99)
Glucose-Capillary: 118 mg/dL — ABNORMAL HIGH (ref 70–99)
Glucose-Capillary: 111 mg/dL — ABNORMAL HIGH (ref 70–99)

## 2021-01-23 LAB — PROCALCITONIN: Procalcitonin: 80.59 ng/mL

## 2021-01-23 LAB — MAGNESIUM: Magnesium: 2 mg/dL (ref 1.7–2.4)

## 2021-01-23 MED ORDER — OXYCODONE HCL 5 MG PO TABS
10.0000 mg | ORAL_TABLET | ORAL | Status: DC | PRN
  Administered 2021-01-23 – 2021-01-25 (×9): 15 mg via ORAL
  Filled 2021-01-23 (×10): qty 3

## 2021-01-23 MED ORDER — DIAZEPAM 5 MG PO TABS
5.0000 mg | ORAL_TABLET | Freq: Once | ORAL | Status: AC
  Administered 2021-01-23: 5 mg via ORAL
  Filled 2021-01-23: qty 1

## 2021-01-23 MED ORDER — UNJURY PLANTED PROTEIN POWDER
2.0000 [oz_av] | Freq: Four times a day (QID) | ORAL | Status: DC
  Administered 2021-01-23 – 2021-01-24 (×3): 2 [oz_av] via ORAL
  Filled 2021-01-23 (×6): qty 25

## 2021-01-23 MED ORDER — ASCORBIC ACID 500 MG PO TABS
500.0000 mg | ORAL_TABLET | Freq: Two times a day (BID) | ORAL | Status: DC
  Administered 2021-01-23 – 2021-01-25 (×5): 500 mg via ORAL
  Filled 2021-01-23 (×4): qty 1

## 2021-01-23 MED ORDER — ZINC SULFATE 220 (50 ZN) MG PO CAPS
220.0000 mg | ORAL_CAPSULE | Freq: Every day | ORAL | Status: DC
  Administered 2021-01-23 – 2021-01-25 (×3): 220 mg via ORAL
  Filled 2021-01-23 (×3): qty 1

## 2021-01-23 MED ORDER — POTASSIUM CHLORIDE CRYS ER 20 MEQ PO TBCR
40.0000 meq | EXTENDED_RELEASE_TABLET | Freq: Once | ORAL | Status: AC
  Administered 2021-01-23: 40 meq via ORAL
  Filled 2021-01-23: qty 2

## 2021-01-23 MED FILL — Sodium Chloride Irrigation Soln 0.9%: Qty: 3000 | Status: AC

## 2021-01-23 MED FILL — Heparin Sodium (Porcine) Inj 1000 Unit/ML: INTRAMUSCULAR | Qty: 30 | Status: AC

## 2021-01-23 MED FILL — Sodium Chloride IV Soln 0.9%: INTRAVENOUS | Qty: 1000 | Status: AC

## 2021-01-23 NOTE — Progress Notes (Signed)
PROGRESS NOTE   Subjective/Complaints:  Pt reports got hurt when transferred for CT scan- and pain meds just aren't cutting it/working for it.  Takes Oxy 10 mg prn and MS Contin-   CT shows no abscess- Does have bacteremia  Procalcitonin down to 80! Mg 2.0 Na 130 K+ 3.4 WBC 5.7  ROS:   Pt denies SOB, abd pain, CP, N/V/C/D, and vision changes  Objective:   CT ABDOMEN PELVIS W CONTRAST  Result Date: 01/22/2021 CLINICAL DATA:  Recent back surgery, abdominal pain EXAM: CT ABDOMEN AND PELVIS WITH CONTRAST TECHNIQUE: Multidetector CT imaging of the abdomen and pelvis was performed using the standard protocol following bolus administration of intravenous contrast. CONTRAST:  22mL OMNIPAQUE IOHEXOL 300 MG/ML  SOLN COMPARISON:  01/21/2021 FINDINGS: Lower chest: No acute pleural or parenchymal lung disease. Hepatobiliary: No focal liver abnormality is seen. No gallstones, gallbladder wall thickening, or biliary dilatation. Pancreas: Unremarkable. No pancreatic ductal dilatation or surrounding inflammatory changes. Spleen: Spleen is enlarged, measuring 18.8 x 17.4 x 10.1 cm. No focal parenchymal abnormality. Adrenals/Urinary Tract: Adrenal glands are unremarkable. Kidneys are normal, without renal calculi, focal lesion, or hydronephrosis. Bladder decompressed by Foley catheter. Stomach/Bowel: No bowel obstruction or ileus. Normal appendix right lower quadrant. No bowel wall thickening or inflammatory change. Vascular/Lymphatic: No significant vascular findings are present. No enlarged abdominal or pelvic lymph nodes. Reproductive: Prostate is unremarkable. Other: No free intraperitoneal fluid or free gas. No abdominal wall hernia. Musculoskeletal: Postsurgical changes are seen from discectomies at L1-2, L2-3, L3-4, L4-5, and L5-S1. Laminectomy and posterior fusion identified spanning L1 through S1. No evidence of metallic hardware loosening or  failure. Postsurgical changes are seen within the midline lower back. Evaluation of the central canal is limited due to streak artifact. Minimal fluid within the subcutaneous tissues likely postoperative seroma. No rim enhancing fluid collection to suggest abscess. Minimal gas adjacent to the T12 spinous process likely postsurgical. No acute displaced fractures. Reconstructed images demonstrate no additional findings. IMPRESSION: 1. Extensive postsurgical changes throughout the lumbar spine as above. 2. Splenomegaly. 3. Otherwise no acute intra-abdominal or intrapelvic process. Electronically Signed   By: Randa Ngo M.D.   On: 01/22/2021 20:42   DG CHEST PORT 1 VIEW  Result Date: 01/21/2021 CLINICAL DATA:  Fever EXAM: PORTABLE CHEST 1 VIEW COMPARISON:  CT chest 07/18/2013 FINDINGS: Prominent cardiac silhouette due to AP portable technique. The heart and mediastinal contours are within normal limits. No focal consolidation. No pulmonary edema. No pleural effusion. No pneumothorax. No acute osseous abnormality. IMPRESSION: No active disease. Electronically Signed   By: Iven Finn M.D.   On: 01/21/2021 19:42   DG Abd Portable 1V  Result Date: 01/21/2021 CLINICAL DATA:  fever EXAM: PORTABLE ABDOMEN - 1 VIEW COMPARISON:  x-ray abdomen 01/18/2021 FINDINGS: Markedly limited evaluation due to overlapping osseous structures and overlying soft tissues. Lumbosacral surgical hardware again noted. The bowel gas pattern is normal. No radio-opaque calculi or other significant radiographic abnormality are seen. IMPRESSION: Nonobstructive bowel gas pattern. Electronically Signed   By: Iven Finn M.D.   On: 01/21/2021 19:43   VAS [name] LOWER EXTREMITY VENOUS (DVT)  Result Date: 01/22/2021  Lower Venous DVT Study Patient  Name:  [name]  Date of Exam:   01/22/2021 Medical Rec #: [id number]     Accession #:    [id number] Date of Birth: 28-Feb-1966     Patient Gender: M Patient Age:   55 years Exam Location:   [facility] Procedure:      VAS [name] LOWER EXTREMITY VENOUS (DVT) Referring Phys: PAMELA LOVE --------------------------------------------------------------------------------  Indications: Fever.  Comparison Study: no prior Performing Technologist: Archie Patten RVS  Examination Guidelines: A complete evaluation includes B-mode imaging, spectral Doppler, color Doppler, and power Doppler as needed of all accessible portions of each vessel. Bilateral testing is considered an integral part of a complete examination. Limited examinations for reoccurring indications may be performed as noted. The reflux portion of the exam is performed with the patient in reverse Trendelenburg.  +---------+---------------+---------+-----------+----------+--------------+ RIGHT    CompressibilityPhasicitySpontaneityPropertiesThrombus Aging +---------+---------------+---------+-----------+----------+--------------+ CFV      Full           Yes      Yes                                 +---------+---------------+---------+-----------+----------+--------------+ SFJ      Full                                                        +---------+---------------+---------+-----------+----------+--------------+ FV Prox  Full                                                        +---------+---------------+---------+-----------+----------+--------------+ FV Mid   Full                                                        +---------+---------------+---------+-----------+----------+--------------+ FV DistalFull                                                        +---------+---------------+---------+-----------+----------+--------------+ PFV      Full                                                        +---------+---------------+---------+-----------+----------+--------------+ POP      Full           Yes      Yes                                  +---------+---------------+---------+-----------+----------+--------------+ PTV      Full                                                        +---------+---------------+---------+-----------+----------+--------------+  PERO     Full                                                        +---------+---------------+---------+-----------+----------+--------------+   +---------+---------------+---------+-----------+----------+--------------+ LEFT     CompressibilityPhasicitySpontaneityPropertiesThrombus Aging +---------+---------------+---------+-----------+----------+--------------+ CFV      Full           Yes      Yes                                 +---------+---------------+---------+-----------+----------+--------------+ SFJ      Full                                                        +---------+---------------+---------+-----------+----------+--------------+ FV Prox  Full                                                        +---------+---------------+---------+-----------+----------+--------------+ FV Mid   Full                                                        +---------+---------------+---------+-----------+----------+--------------+ FV DistalFull                                                        +---------+---------------+---------+-----------+----------+--------------+ PFV      Full                                                        +---------+---------------+---------+-----------+----------+--------------+ POP      Full           Yes      Yes                                 +---------+---------------+---------+-----------+----------+--------------+ PTV      Full                                                        +---------+---------------+---------+-----------+----------+--------------+ PERO     Full                                                         +---------+---------------+---------+-----------+----------+--------------+  Summary: BILATERAL: - No evidence of deep vein thrombosis seen in the lower extremities, bilaterally. -No evidence of popliteal cyst, bilaterally.   *See table(s) above for measurements and observations.    Preliminary    Recent Labs    01/22/21 0510 01/23/21 0513  WBC 9.4 5.7  HGB 8.4* 8.1*  HCT 24.5* 23.7*  PLT 205 200   Recent Labs    01/22/21 0510 01/23/21 0513  NA 128* 130*  K 3.5 3.4*  CL 96* 95*  CO2 25 25  GLUCOSE 174* 120*  BUN 21* 16  CREATININE 1.05 0.96  CALCIUM 7.8* 7.9*    Intake/Output Summary (Last 24 hours) at 01/23/2021 0941 Last data filed at 01/23/2021 0838 Gross per 24 hour  Intake 1240 ml  Output 4750 ml  Net -3510 ml        Physical Exam: Vital Signs Blood pressure 130/74, pulse 100, temperature 99 F (37.2 C), resp. rate 18, height 6\' 2"  (1.88 m), weight (!) 147.3 kg, SpO2 97 %.     General: awake, alert, appropriate, laying in bed; turned with PT who's in room; NAD HENT: conjugate gaze; oropharynx moist CV: regular rhythm, but tachycardic rate; no JVD Pulmonary: CTA B/L; no W/R/R- good air movement GI: soft, NT, protuberant vs distended; normoactive BS Psychiatric: appropriate; depressed affect Neurological: alert   Ext: no clubbing, cyanosis, or edema Psych: pleasant and cooperative  Skin: incision puffy/boggy- esp on upper aspect B/L; honeycomb dressing soaked/saturated with dark maroon blood.  Neuro:  Alert and oriented x 3. Normal insight and awareness. Intact Memory. Normal language and speech. Cranial nerve exam unremarkable. UE motor 5/5. RLE limited by pain but 4 to 4+, LLE 4+ to 5. No focal sensory findings. DTR's 1+ Musculoskeletal: right hip adductors tender with palpation - tight on palpation.  Chronic OA changes right knee.   Assessment/Plan: 1. Functional deficits which require 3+ hours per day of interdisciplinary therapy in a comprehensive  inpatient rehab setting. Physiatrist is providing close team supervision and 24 hour management of active medical problems listed below. Physiatrist and rehab team continue to assess barriers to discharge/monitor patient progress toward functional and medical goals  Care Tool:  Bathing    Body parts bathed by patient: Right arm, Left arm, Chest, Abdomen, Front perineal area, Right upper leg, Left upper leg   Body parts bathed by helper: Buttocks, Right lower leg, Left lower leg     Bathing assist Assist Level: Moderate Assistance - Patient 50 - 74%     Upper Body Dressing/Undressing Upper body dressing   What is the patient wearing?: Pull over shirt, Orthosis    Upper body assist Assist Level: Moderate Assistance - Patient 50 - 74%    Lower Body Dressing/Undressing Lower body dressing      What is the patient wearing?: Pants     Lower body assist Assist for lower body dressing: Total Assistance - Patient < 25%     Toileting Toileting    Toileting assist Assist for toileting: Moderate Assistance - Patient 50 - 74%     Transfers Chair/bed transfer  Transfers assist     Chair/bed transfer assist level: Moderate Assistance - Patient 50 - 74%     Locomotion Ambulation   Ambulation assist      Assist level: Minimal Assistance - Patient > 75% Assistive device: Walker-rolling Max distance: 12   Walk 10 feet activity   Assist  Walk 10 feet activity did not occur: Safety/medical concerns  Assist level: Minimal Assistance -  Patient > 75% Assistive device: Walker-rolling   Walk 50 feet activity   Assist Walk 50 feet with 2 turns activity did not occur: N/A  Assist level: Minimal Assistance - Patient > 75% Assistive device: Walker-rolling    Walk 150 feet activity   Assist Walk 150 feet activity did not occur: N/A         Walk 10 feet on uneven surface  activity   Assist Walk 10 feet on uneven surfaces activity did not occur: N/A          Wheelchair     Assist Is the patient using a wheelchair?: No Type of Wheelchair: Manual    Wheelchair assist level: Total Assistance - Patient < 25% Max wheelchair distance: 10 ft    Wheelchair 50 feet with 2 turns activity    Assist        Assist Level: Total Assistance - Patient < 25%   Wheelchair 150 feet activity     Assist      Assist Level: Total Assistance - Patient < 25%   Blood pressure 130/74, pulse 100, temperature 99 F (37.2 C), resp. rate 18, height 6\' 2"  (1.88 m), weight (!) 147.3 kg, SpO2 97 %. Medical Problem List and Plan: 1.  Impaired mobility s/p TLIF for lumbar radiculoapthy             -patient may shower but incision must be covered             -ELOS/Goals: 14 days              -Continue CIR therapies including PT, OT   10/12- con't PT and OT- d/c 10/24 2.  Antithrombotics: -DVT/anticoagulation:  Pharmaceutical: Lovenox             -antiplatelet therapy: N/a 3. Pain Management: Continue oxycodone prn. .             --scheduled Zanaflex increased to 4 mg TID             --Continue Gabapentin 600 mg TID (PTA)  -continue kpad for right thigh/leg  -ms contin 15mg  q12 started Friday  10/11- pain- still tight- esp R inner thigh adductors- and R low back- will add Robaxin 750 mg TID Prn.   10/12- will give 1 xValium dose and con't MS Contin- increase Oxy to 10-15 mg q4 hours prn since pain not controlled.  4. Mood: LCSW to follow for evaluation and support.              -antipsychotic agents: N/A 5. Neuropsych: This patient is capable of making decisions on his own behalf. 6. Spinal incision: Monitor wound for healing. Routine pressure relief measures.              -started MVI as well as juven to help promote wound healing.              -given bogginess and drainage, continue doxycycline -primary team requested NSGY to follow along.  10/10- NSU called and following-    7. Fluids/Electrolytes/Nutrition: Monitor I/O--encourage fluid  intake. 8. HTN; Did have issues with significant hypotension post op--continue to hold BP meds for now. -- orthostatic vitals were ordered.  9. T2DM: Hgb A1C-9.0. Add CM restrictions. Renal status stable--continue to encourage fluid intake.               ---Monitor BS ac/hs with SSI for elevated BS.              --discontinued levemir and  resume metformin.             CBG (last 3)  Recent Labs    01/22/21 1701 01/22/21 2052 01/23/21 0558  GLUCAP 164* 108* 121*   10/9 cbg's under better control today. May need second med or resumption of insulin. Observe for pattern  10/10- was more elevated today- added Glipizide 5 mg BID-AC- and will monitor- better BG control is good for wound healing.   10/12- BG's controlled- con't regimen 10. Acute blood loss anemia: Monitor for signs of bleeding. Recheck CBC on 10/10 11. Acute on chronic CKD?: Will recheck CMET on 10/10.  12. OSA: Continue CPAP at nights.  13.  Elevated CK: Question rhabdo due to statin. Continue to hold Crestor and recheck CK on 01/21/21.  10/10- Cr 1.18- doing better- con't to monitor 14.  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   15. Infection. Klebsiella pneumonia bacteremia  10/10- procalcitonin 137! Will start of Cefipime and Vanc IV and cansult IM to help. Labs in AM  10/11- Procalcitonin- reduced to 124- will monitor and con't IV ABX- IM didn't feel anything else to do.   10/12- procalcitonin down to 80- on Cefipime IV- ID consulted yesterday and suggested CT of abd/pelvis- which shows no abscess, etc- just enlarged spleen- per wife, never had before that they were aware of- will ask Surgery to see if spleen size could be causing some of abd pain? 16. Hypomagnesemia  10/10- will give MgOx 400 mg BID and recheck this week  10/11- DO IV Mg to help and recheck.   10/12- Mg up to 2.0- con't regimen 17. Constipation  10/10- LBM 3 days ago- could be cause of N/V this am x3- will give sorbitol x1 after therapy today.    10/11- no BM- will give enema to get cleaned out.   10/12- had BM yesterday AM- con't regimen 18. Hypokalemia  10/12- will replete KCL with 40 mEq x1 19. Azotemia  10/12- finish IVFs since Bun down to 16 and doing better  I spent a total of 41 minutes on total care- >50% on coordination of care discussing spleen size and calling pt's wife.   LOS: 5 days A FACE TO FACE EVALUATION WAS PERFORMED  [name] 01/23/2021, 9:41 AM

## 2021-01-23 NOTE — Progress Notes (Signed)
Physical Therapy Session Note  Patient Details  Name: [name] MRN: [id number] Date of Birth: 10-Dec-1965  Today's Date: 01/23/2021 PT Individual Time: 1420-1530 PT Individual Time Calculation (min): 70 min   Short Term Goals: Week 1:  PT Short Term Goal 1 (Week 1): Pt will perform bed mobility with CGA while adhering to back precautions. PT Short Term Goal 2 (Week 1): Pt will perform standing transfers with consistent CGA. PT Short Term Goal 3 (Week 1): Pt will ambulate at least 30 feet consistently with CGA and LRAD. PT Short Term Goal 4 (Week 1): Pt will initiate stair negotiation training. PT Short Term Goal 5 (Week 1): Pt will correctly don lumbar corset with supervision and min cues.  Skilled Therapeutic Interventions/Progress Updates: Pt presented in bed agreeable to therapy with encouragement. Pt with pain 8/10, nsg notified and med provided during session. Pt reluctant to get OOB but indicated possible need for BM. Pt agreeable to ambulate to toilet. Performed supine to sit with CGA and use of bed features with increased time. Pt ambulated to toilet with RW and CGA with PTA assisting with IV pole. Performed toilet transfer with CGA and was minA for clothing management (+BM). Pt's wife assisted with peri-care and pt was modA for LB clothing management. Pt then ambulated to recliner and after brief rest PTA donned LSO. Pt performed Sit to stand with CGA and increased effort however was able to maintain spinal precautions. Pt then agreeable to ambulate to nsg station. Pt ambulated ~181f with RW and CGA. Pt noted to have narrow BOS and flexed knees. Pt required x 1 brief standing rest due to fatigue. Pt then returned to room and performed sit to supine with modA for BLE management. Pt required modA for repositioning however was able to perform small bridge to adjust hips. Pt repositioned to comfort and left with call bell within reach and current needs met.      Therapy  Documentation Precautions:  Precautions Precautions: Fall, Back Precaution Booklet Issued: No Precaution Comments: pt able to recall 3/3 back precautions with ind when prompted Required Braces or Orthoses: Spinal Brace Spinal Brace: Lumbar corset, Applied in sitting position Other Brace: for comfort, donned seated Restrictions Weight Bearing Restrictions: No General:   Vital Signs: Therapy Vitals Temp: 99.4 F (37.4 C) Temp Source: Oral Pulse Rate: (!) 107 Resp: 18 BP: 123/66 Patient Position (if appropriate): Lying Oxygen Therapy SpO2: 99 % O2 Device: Room Air Pain: Pain Assessment Pain Score: 2  Mobility:   Locomotion :    Trunk/Postural Assessment :    Balance:   Exercises:   Other Treatments:      Therapy/Group: Individual Therapy  [name] 01/23/2021, 4:16 PM

## 2021-01-23 NOTE — Progress Notes (Signed)
Occupational Therapy Note  Patient Details  Name: [name] MRN: [id number] Date of Birth: 1966/03/08  Today's Date: 01/23/2021 OT Missed Time: 54 Minutes Missed Time Reason: Pain  Pt resting in bed upon arrival. Pt states he had a rough night because he was in pain all night. Pt states that transfer from CT yesterday was very uncomfortable and caused increased increase in pain. Pt rates back pain at 8/10. Pt scheduled for additional therapy later in morning. Will check back as time allows. Pt missed 60 mins skilled OT services.    Leotis Shames [facility] 01/23/2021, 8:48 AM

## 2021-01-23 NOTE — Progress Notes (Signed)
CPAP within patients reach.  Patient stated he will place CPAP on self when ready.  RT will continue to monitor.

## 2021-01-23 NOTE — Progress Notes (Signed)
Patient is A&Ox 4 and able to make his needs known. Honeycomb dressing saturated with sanguinous fluid and leaking out of bandage. Spoke to P. Love NP who changed order . Remove honeycomb dressing, to wash back with soap and water, pat dry and apply ABD to lumber /surgical TID and PRN. Dressing was changed x 3 on 7a-7p shift. Odorous saturated dressing with sanguinous  fluid noted. Patient continues to request PRN pain medications Q 4 hours for back and right leg/thigh pain. Large BM x 2 this  shift. IV continues in right wrist at 100 ml/hr continuous. IV clean dry and intact. Call light and personal items within reach. Bed in low position with safety devices in place.

## 2021-01-23 NOTE — Progress Notes (Signed)
Occupational Therapy Session Note  Patient Details  Name: [name] MRN: [id number] Date of Birth: [birth year]  Today's Date: 01/23/2021 OT Individual Time: 1000-1055 OT Individual Time Calculation (min): 55 min    Short Term Goals: Week 1:  OT Short Term Goal 1 (Week 1): Pt will complete grooming task in standing with CGA. OT Short Term Goal 2 (Week 1): Pt will complete ambulatory toilet transfer with min A + LRAD. OT Short Term Goal 3 (Week 1): Pt will don pants with mod A + AE PRN.  Skilled Therapeutic Interventions/Progress Updates:    Pt resting in bed upon arrival. Pt reports that his pain is much better then previously and requested to get OOB and use toilet. Supine>sit EOB with supervision using bed rails and HOB elevated. Pt used proper technique to adhere to back precautions. Pt required assistance donning LSO when seated. EOB elevated and pt performed sit<>stand with min A. Amb with RW to bathroom with CGA. Dependent to hygiene but pt assisted with doffing/donning pants for toileting. Pt returned to recliner. Grooming while seated in recliner. Discussed taking a shower once pt can tolerate sitting without back brace AND drainage on back improves. Pt remained seated in relciner with seat alarm activated. All needs within reach.   Therapy Documentation Precautions:  Precautions Precautions: Fall, Back Precaution Booklet Issued: No Precaution Comments: pt able to recall 3/3 back precautions with ind when prompted Required Braces or Orthoses: Spinal Brace Spinal Brace: Lumbar corset, Applied in sitting position Other Brace: for comfort, donned seated Restrictions Weight Bearing Restrictions: No :   Pain:  Pt reports 5/10 pain in back; repositioned   Therapy/Group: Individual Therapy  [name] 01/23/2021, 11:01 AM

## 2021-01-24 ENCOUNTER — Inpatient Hospital Stay (HOSPITAL_COMMUNITY): Payer: 59

## 2021-01-24 ENCOUNTER — Ambulatory Visit (HOSPITAL_BASED_OUTPATIENT_CLINIC_OR_DEPARTMENT_OTHER): Payer: 59

## 2021-01-24 DIAGNOSIS — Z9889 Other specified postprocedural states: Secondary | ICD-10-CM | POA: Insufficient documentation

## 2021-01-24 DIAGNOSIS — Y838 Other surgical procedures as the cause of abnormal reaction of the patient, or of later complication, without mention of misadventure at the time of the procedure: Secondary | ICD-10-CM | POA: Insufficient documentation

## 2021-01-24 DIAGNOSIS — M9689 Other intraoperative and postprocedural complications and disorders of the musculoskeletal system: Secondary | ICD-10-CM | POA: Insufficient documentation

## 2021-01-24 DIAGNOSIS — R7881 Bacteremia: Secondary | ICD-10-CM | POA: Diagnosis not present

## 2021-01-24 LAB — EPSTEIN-BARR VIRUS (EBV) ANTIBODY PROFILE
EBV NA IgG: 185 U/mL — ABNORMAL HIGH (ref 0.0–17.9)
EBV VCA IgG: >600 U/mL — ABNORMAL HIGH (ref 0.0–17.9)
EBV VCA IgM: <36 U/mL (ref 0.0–35.9)

## 2021-01-24 LAB — CULTURE, BLOOD (ROUTINE X 2)

## 2021-01-24 LAB — ECHOCARDIOGRAM COMPLETE
AR max vel: 2.5 cm2
AV Area VTI: 2.32 cm2
AV Area mean vel: 2.41 cm2
AV Mean grad: 5 mmHg
AV Peak grad: 9.6 mmHg
Ao pk vel: 1.55 m/s
Area-P 1/2: 4.49 cm2
Height: 74 in
S' Lateral: 2.9 cm
Weight: 5195.8 oz

## 2021-01-24 LAB — BASIC METABOLIC PANEL
Anion gap: 10 (ref 5–15)
BUN: 15 mg/dL (ref 6–20)
CO2: 24 mmol/L (ref 22–32)
Calcium: 8.1 mg/dL — ABNORMAL LOW (ref 8.9–10.3)
Chloride: 99 mmol/L (ref 98–111)
Creatinine, Ser: 0.92 mg/dL (ref 0.61–1.24)
GFR, Estimated: >60 mL/min (ref >60)
Glucose, Bld: 118 mg/dL — ABNORMAL HIGH (ref 70–99)
Potassium: 3.7 mmol/L (ref 3.5–5.1)
Sodium: 133 mmol/L — ABNORMAL LOW (ref 135–145)

## 2021-01-24 LAB — CBC WITH DIFFERENTIAL/PLATELET
Abs Immature Granulocytes: 0.03 10*3/uL (ref 0.00–0.07)
Basophils Absolute: 0 10*3/uL (ref 0.0–0.1)
Basophils Relative: 1 %
Eosinophils Absolute: 0.1 10*3/uL (ref 0.0–0.5)
Eosinophils Relative: 3 %
HCT: 24.4 % — ABNORMAL LOW (ref 39.0–52.0)
Hemoglobin: 8.4 g/dL — ABNORMAL LOW (ref 13.0–17.0)
Immature Granulocytes: 1 %
Lymphocytes Relative: 10 %
Lymphs Abs: 0.4 10*3/uL — ABNORMAL LOW (ref 0.7–4.0)
MCH: 30.2 pg (ref 26.0–34.0)
MCHC: 34.4 g/dL (ref 30.0–36.0)
MCV: 87.8 fL (ref 80.0–100.0)
Monocytes Absolute: 0.5 10*3/uL (ref 0.1–1.0)
Monocytes Relative: 12 %
Neutro Abs: 3 10*3/uL (ref 1.7–7.7)
Neutrophils Relative %: 73 %
Platelets: 206 10*3/uL (ref 150–400)
RBC: 2.78 MIL/uL — ABNORMAL LOW (ref 4.22–5.81)
RDW: 13 % (ref 11.5–15.5)
WBC: 4 10*3/uL (ref 4.0–10.5)
nRBC: 0 % (ref 0.0–0.2)

## 2021-01-24 LAB — GLUCOSE, CAPILLARY
Glucose-Capillary: 126 mg/dL — ABNORMAL HIGH (ref 70–99)
Glucose-Capillary: 145 mg/dL — ABNORMAL HIGH (ref 70–99)
Glucose-Capillary: 151 mg/dL — ABNORMAL HIGH (ref 70–99)
Glucose-Capillary: 91 mg/dL (ref 70–99)

## 2021-01-24 IMAGING — CT CT L SPINE W/ CM
3 of 4 series · 13 of 33 positions shown, 16 images · IV contrast (omnipaque)
Comparison: [DATE] CT lumbar spine, [DATE] CT abdomen
pelvis

CLINICAL DATA: Low back pain, infection suspected

EXAM:
CT LUMBAR SPINE WITH CONTRAST
TECHNIQUE: Multidetector CT imaging of the lumbar spine was performed with
intravenous contrast administration.
CONTRAST:  100mL OMNIPAQUE IOHEXOL 350 MG/ML SOLN

[Series 6: l spine 2.0 st · axial · 0.47mm/px · z∈[+1162,+1364]mm · 5 of 153 slices shown, 7 images]
[im 26/153  soft-tissue]
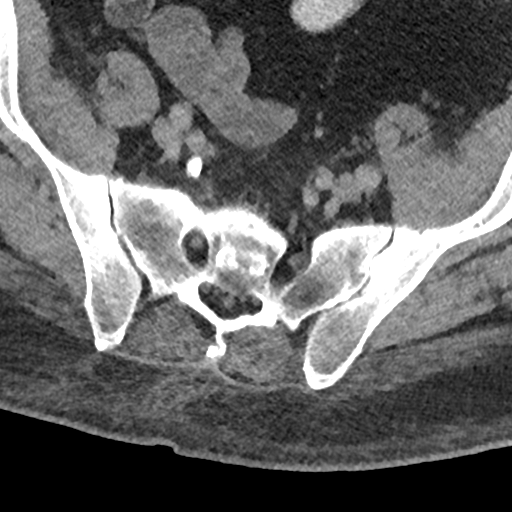
[im 26/153  bone]
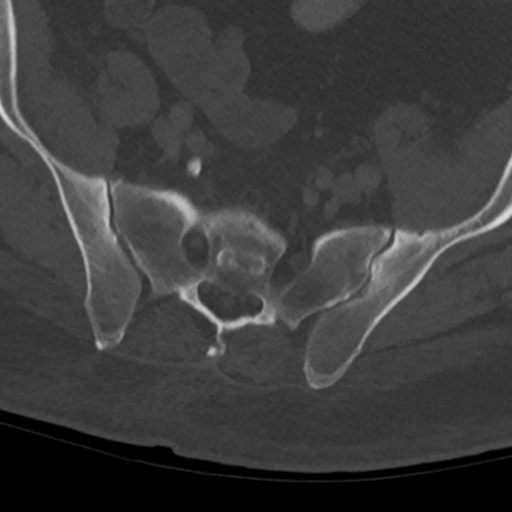
[im 51/153  bone]
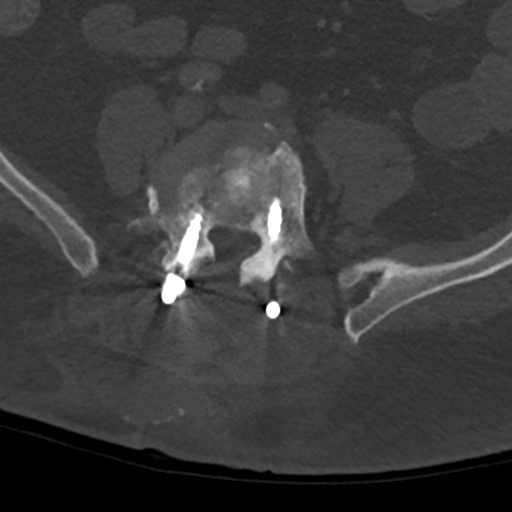
[im 77/153  bone]
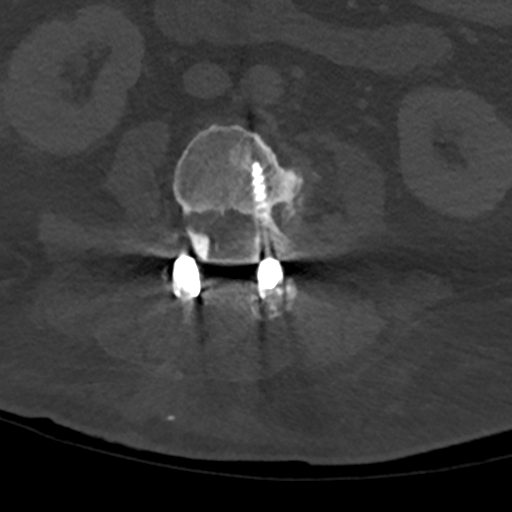
[im 102/153  bone]
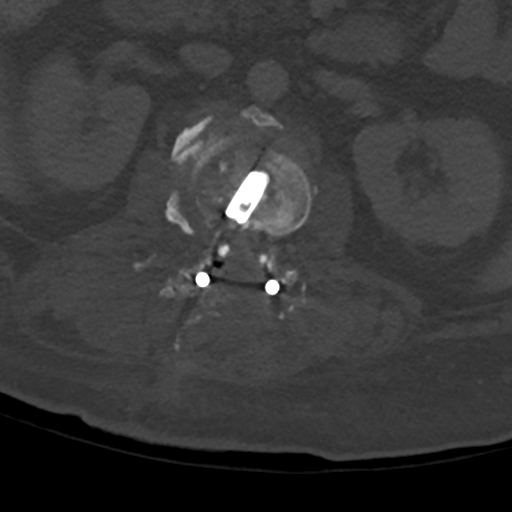
[im 127/153  soft-tissue]
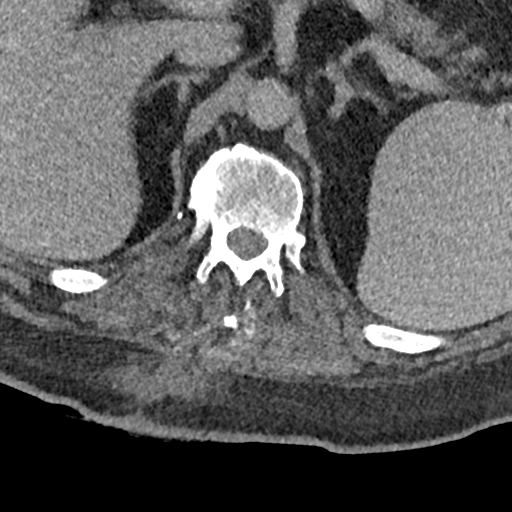
[im 127/153  bone]
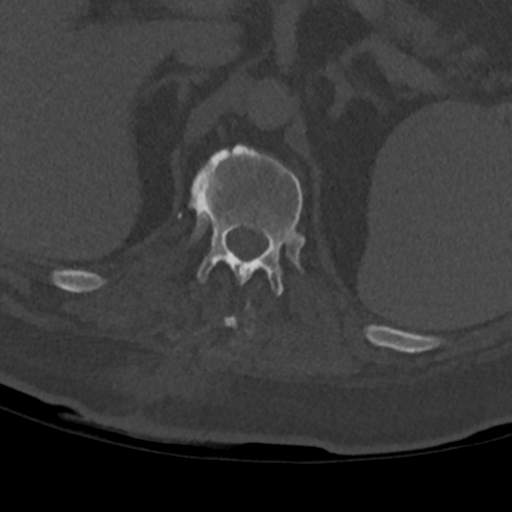

[Series 9: coronal st · coronal · 0.60mm/px · 3 of 121 slices shown]
[im 25/121  bone]
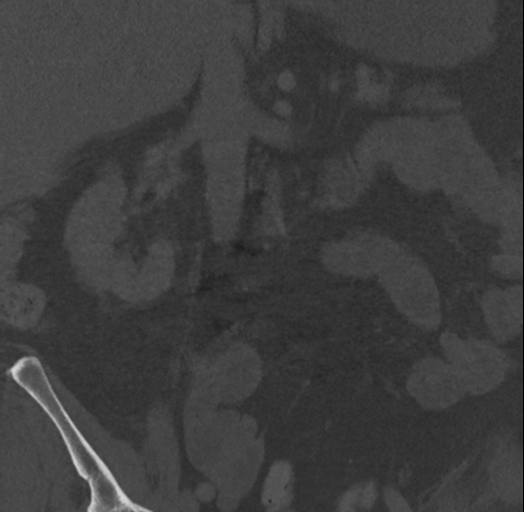
[im 49/121  bone]
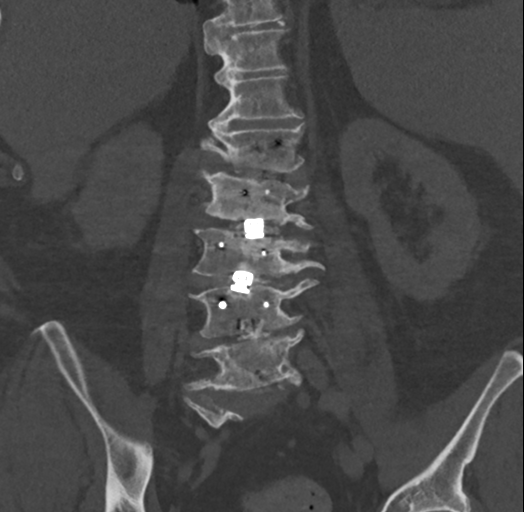
[im 73/121  bone]
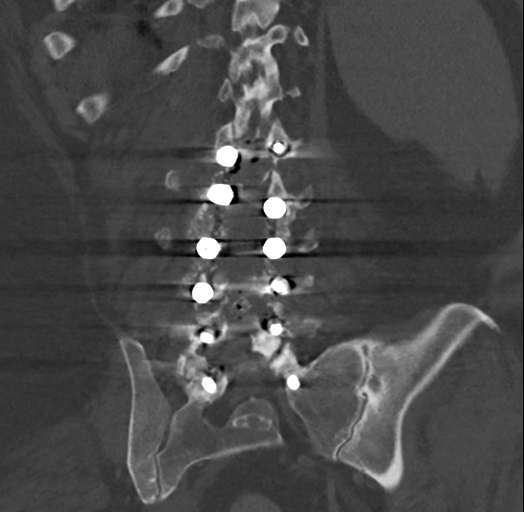

[Series 10: sagittal st · sagittal · 0.45mm/px · 5 of 136 slices shown, 6 images]
[im 46/136  bone]
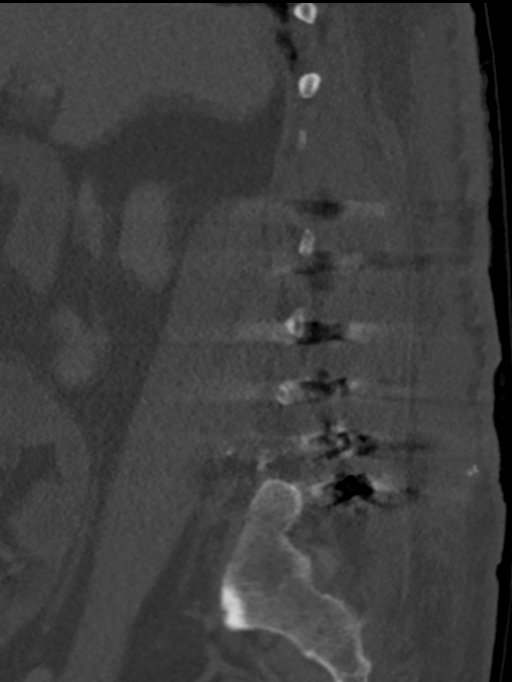
[im 57/136  bone]
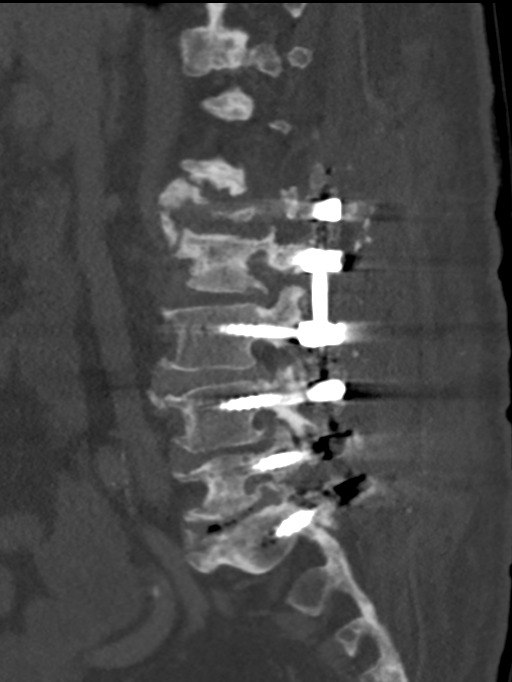
[im 68/136  soft-tissue]
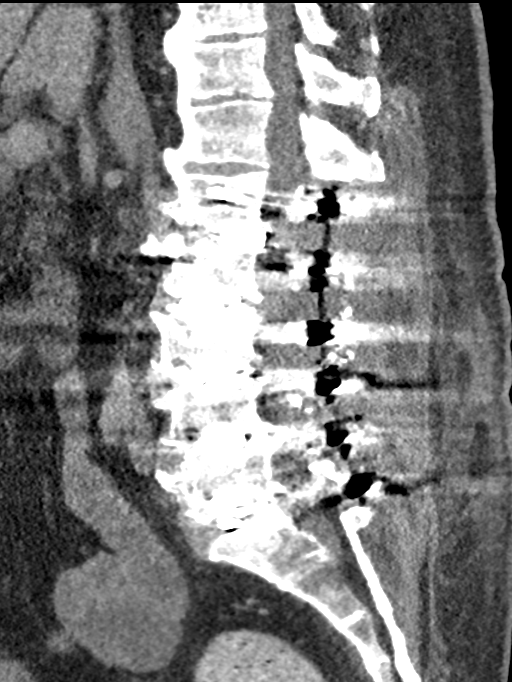
[im 68/136  bone]
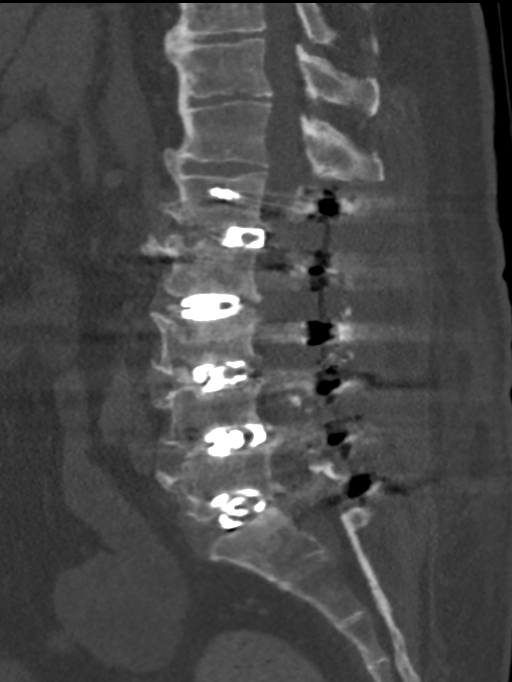
[im 79/136  bone]
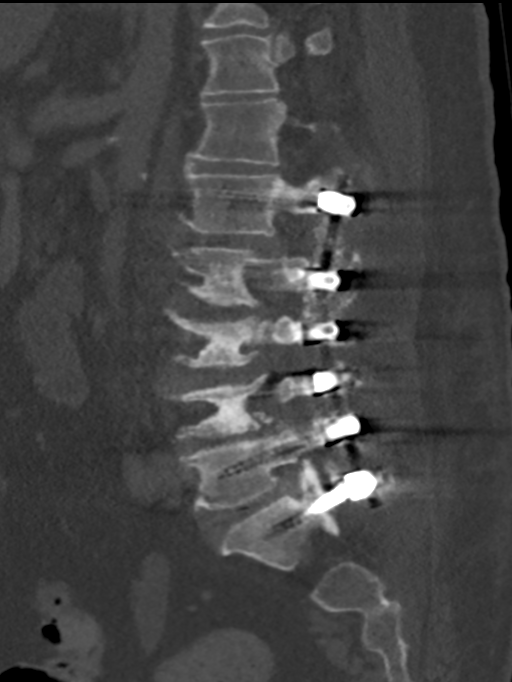
[im 91/136  bone]
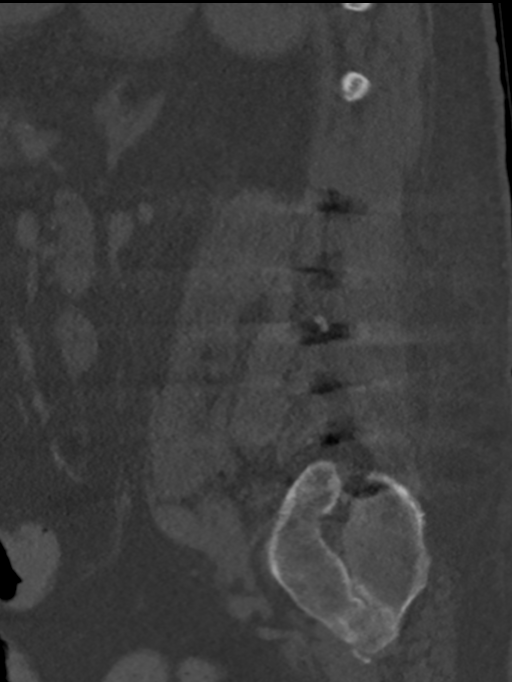

[13 of 33 positions shown; findings below may reference images not displayed]

FINDINGS: Segmentation: 5 lumbar type vertebrae.

Alignment: Mild dextrocurvature of the lumbar spine.

Vertebrae: Status post posterior fixation L1-S1, with interbody disc
spacers and posterior decompression, which appear unchanged compared
to [DATE]. Evaluation is somewhat limited by beam hardening
artifact from hardware, but no acute fractures seen. Fusion of the
left sacroiliac joint

Paraspinal and other soft tissues: Fluid and small amount of air in
the soft tissues and musculature posterior to the fixation and
decompression site, grossly unchanged compared to [DATE]. The
soft tissue component of fluid collection extends from the level of
T10 to S1, and measures up to 2.2 x 4.1 cm (series 6, image 97),
just to the right of midline; the portion that extends into the
operative bed is difficult to measure given beam hardening artifact
from hardware, but the collection likely extends from L1 through S1
and extends to the posterior aspect of the thecal sac (series 6,
image 93). High density material and air is noted in the fluid
collection. No significant enhancement. No prevertebral collection.

Disc levels: Evaluation is limited by beam hardening artifact from
hardware and postoperative fluid collection. No osseous spinal canal
stenosis, status post extensive posterior decompression.
IMPRESSION: Fluid collection with a small amount of air and high density
material in the soft tissues and musculature posterior to the L1-S1
posterior fixation and decompression site, most likely a
postoperative seroma. The soft tissue component of the collection
extends from T10-S1. The intramuscular and operative bed portion of
the collection is difficult to visualize secondary to beam hardening
artifact from adjacent hardware; however it appears to extend to the
posterior aspect of the thecal sac. No significant enhancement to
suggest abscess. No prevertebral collection.

## 2021-01-24 MED ORDER — OXYCODONE HCL 5 MG PO TABS
15.0000 mg | ORAL_TABLET | Freq: Once | ORAL | Status: AC
Start: 2021-01-24 — End: 2021-01-24
  Administered 2021-01-24: 15 mg via ORAL

## 2021-01-24 MED ORDER — PANTOPRAZOLE SODIUM 40 MG PO TBEC
40.0000 mg | DELAYED_RELEASE_TABLET | Freq: Every day | ORAL | Status: DC
  Administered 2021-01-24 – 2021-01-25 (×2): 40 mg via ORAL
  Filled 2021-01-24 (×2): qty 1

## 2021-01-24 MED ORDER — DIAZEPAM 5 MG PO TABS
5.0000 mg | ORAL_TABLET | Freq: Once | ORAL | Status: AC
  Administered 2021-01-24: 5 mg via ORAL
  Filled 2021-01-24: qty 1

## 2021-01-24 MED ORDER — IOHEXOL 350 MG/ML SOLN
100.0000 mL | Freq: Once | INTRAVENOUS | Status: AC | PRN
  Administered 2021-01-24: 100 mL via INTRAVENOUS

## 2021-01-24 MED ORDER — UNJURY CHICKEN SOUP POWDER
2.0000 [oz_av] | Freq: Four times a day (QID) | ORAL | Status: DC
  Filled 2021-01-24 (×7): qty 27

## 2021-01-24 NOTE — Progress Notes (Signed)
Contacted [facility] yesterday to discuss findings of enlarged spleen on CT abdomen/pelvis. They recommended follow up on outpatient basis as patient asymptomatic with stable platelets and to check for EBV. Labs ordered and pending.

## 2021-01-24 NOTE — Progress Notes (Signed)
PROGRESS NOTE   Subjective/Complaints:  Pt said was put on air mattress last night- hurts back SO bad- poor sleep- every time tried to go to sleep; bed moved again- cannot handle it.  Also valium helped a lot for pain yesterday and helped into the evening-   Also increase in pain meds helped.   Still draining a lot.  Constant aching and burning in R low back- better with meds, but still an issue.  ROS:   Pt denies SOB, abd pain, CP, N/V/C/D, and vision changes  Objective:   CT ABDOMEN PELVIS W CONTRAST  Result Date: 01/22/2021 CLINICAL DATA:  Recent back surgery, abdominal pain EXAM: CT ABDOMEN AND PELVIS WITH CONTRAST TECHNIQUE: Multidetector CT imaging of the abdomen and pelvis was performed using the standard protocol following bolus administration of intravenous contrast. CONTRAST:  15mL OMNIPAQUE IOHEXOL 300 MG/ML  SOLN COMPARISON:  01/21/2021 FINDINGS: Lower chest: No acute pleural or parenchymal lung disease. Hepatobiliary: No focal liver abnormality is seen. No gallstones, gallbladder wall thickening, or biliary dilatation. Pancreas: Unremarkable. No pancreatic ductal dilatation or surrounding inflammatory changes. Spleen: Spleen is enlarged, measuring 18.8 x 17.4 x 10.1 cm. No focal parenchymal abnormality. Adrenals/Urinary Tract: Adrenal glands are unremarkable. Kidneys are normal, without renal calculi, focal lesion, or hydronephrosis. Bladder decompressed by Foley catheter. Stomach/Bowel: No bowel obstruction or ileus. Normal appendix right lower quadrant. No bowel wall thickening or inflammatory change. Vascular/Lymphatic: No significant vascular findings are present. No enlarged abdominal or pelvic lymph nodes. Reproductive: Prostate is unremarkable. Other: No free intraperitoneal fluid or free gas. No abdominal wall hernia. Musculoskeletal: Postsurgical changes are seen from discectomies at L1-2, L2-3, L3-4, L4-5, and L5-S1.  Laminectomy and posterior fusion identified spanning L1 through S1. No evidence of metallic hardware loosening or failure. Postsurgical changes are seen within the midline lower back. Evaluation of the central canal is limited due to streak artifact. Minimal fluid within the subcutaneous tissues likely postoperative seroma. No rim enhancing fluid collection to suggest abscess. Minimal gas adjacent to the T12 spinous process likely postsurgical. No acute displaced fractures. Reconstructed images demonstrate no additional findings. IMPRESSION: 1. Extensive postsurgical changes throughout the lumbar spine as above. 2. Splenomegaly. 3. Otherwise no acute intra-abdominal or intrapelvic process. Electronically Signed   By: Randa Ngo M.D.   On: 01/22/2021 20:42   VAS [name] LOWER EXTREMITY VENOUS (DVT)  Result Date: 01/23/2021  Lower Venous DVT Study Patient Name:  [name]  Date of Exam:   01/22/2021 Medical Rec #: [id number]     Accession #:    [id number] Date of Birth: 11-08-1965     Patient Gender: M Patient Age:   55 years Exam Location:  [facility] Procedure:      VAS [name] LOWER EXTREMITY VENOUS (DVT) Referring Phys: PAMELA LOVE --------------------------------------------------------------------------------  Indications: Fever.  Comparison Study: no prior Performing Technologist: Archie Patten RVS  Examination Guidelines: A complete evaluation includes B-mode imaging, spectral Doppler, color Doppler, and power Doppler as needed of all accessible portions of each vessel. Bilateral testing is considered an integral part of a complete examination. Limited examinations for reoccurring indications may be performed as noted. The reflux portion of the exam  is performed with the patient in reverse Trendelenburg.  +---------+---------------+---------+-----------+----------+--------------+ RIGHT    CompressibilityPhasicitySpontaneityPropertiesThrombus Aging  +---------+---------------+---------+-----------+----------+--------------+ CFV      Full           Yes      Yes                                 +---------+---------------+---------+-----------+----------+--------------+ SFJ      Full                                                        +---------+---------------+---------+-----------+----------+--------------+ FV Prox  Full                                                        +---------+---------------+---------+-----------+----------+--------------+ FV Mid   Full                                                        +---------+---------------+---------+-----------+----------+--------------+ FV DistalFull                                                        +---------+---------------+---------+-----------+----------+--------------+ PFV      Full                                                        +---------+---------------+---------+-----------+----------+--------------+ POP      Full           Yes      Yes                                 +---------+---------------+---------+-----------+----------+--------------+ PTV      Full                                                        +---------+---------------+---------+-----------+----------+--------------+ PERO     Full                                                        +---------+---------------+---------+-----------+----------+--------------+   +---------+---------------+---------+-----------+----------+--------------+ LEFT     CompressibilityPhasicitySpontaneityPropertiesThrombus Aging +---------+---------------+---------+-----------+----------+--------------+ CFV      Full           Yes      Yes                                 +---------+---------------+---------+-----------+----------+--------------+  SFJ      Full                                                         +---------+---------------+---------+-----------+----------+--------------+ FV Prox  Full                                                        +---------+---------------+---------+-----------+----------+--------------+ FV Mid   Full                                                        +---------+---------------+---------+-----------+----------+--------------+ FV DistalFull                                                        +---------+---------------+---------+-----------+----------+--------------+ PFV      Full                                                        +---------+---------------+---------+-----------+----------+--------------+ POP      Full           Yes      Yes                                 +---------+---------------+---------+-----------+----------+--------------+ PTV      Full                                                        +---------+---------------+---------+-----------+----------+--------------+ PERO     Full                                                        +---------+---------------+---------+-----------+----------+--------------+     Summary: BILATERAL: - No evidence of deep vein thrombosis seen in the lower extremities, bilaterally. -No evidence of popliteal cyst, bilaterally.   *See table(s) above for measurements and observations. Electronically signed by Harold Barban MD on 01/23/2021 at 9:24:49 PM.    Final    Recent Labs    01/23/21 0513 01/24/21 0503  WBC 5.7 4.0  HGB 8.1* 8.4*  HCT 23.7* 24.4*  PLT 200 206   Recent Labs    01/23/21 0513 01/24/21 0503  NA 130* 133*  K 3.4* 3.7  CL 95* 99  CO2 25 24  GLUCOSE 120* 118*  BUN 16 15  CREATININE 0.96 0.92  CALCIUM 7.9* 8.1*    Intake/Output Summary (Last 24 hours) at 01/24/2021 0915 Last data filed at 01/24/2021 0757 Gross per 24 hour  Intake 300 ml  Output 3200 ml  Net -2900 ml        Physical Exam: Vital Signs Blood pressure 118/78,  pulse 94, temperature 99.1 F (37.3 C), temperature source Oral, resp. rate 18, height 6\' 2"  (1.88 m), weight (!) 147.3 kg, SpO2 96 %.      General: awake, alert, appropriate, c/o severe pain; supine in bed; NAD HENT: conjugate gaze; oropharynx moist CV: regular rate; no JVD Pulmonary: CTA B/L; no W/R/R- good air movement GI: soft, NT, ND, (+)BS Psychiatric: appropriate; depressed/pained affect Neurological: Ox3  Ext: no clubbing, cyanosis, or edema Psych: pleasant and cooperative  Skin: incision boggy and puffy still- a little red, but hard to tell since laying on it- saturated with sanguinous drainage and chuck also saturated with drainage- changed last night Neuro:  Alert and oriented x 3. Normal insight and awareness. Intact Memory. Normal language and speech. Cranial nerve exam unremarkable. UE motor 5/5. RLE limited by pain but 4 to 4+, LLE 4+ to 5. No focal sensory findings. DTR's 1+ Musculoskeletal: right hip adductors tender with palpation - tight on palpation.  Chronic OA changes right knee.Also TTP R low back.    Assessment/Plan: 1. Functional deficits which require 3+ hours per day of interdisciplinary therapy in a comprehensive inpatient rehab setting. Physiatrist is providing close team supervision and 24 hour management of active medical problems listed below. Physiatrist and rehab team continue to assess barriers to discharge/monitor patient progress toward functional and medical goals  Care Tool:  Bathing    Body parts bathed by patient: Right arm, Left arm, Chest, Abdomen, Front perineal area, Right upper leg, Left upper leg   Body parts bathed by helper: Buttocks, Right lower leg, Left lower leg     Bathing assist Assist Level: Moderate Assistance - Patient 50 - 74%     Upper Body Dressing/Undressing Upper body dressing   What is the patient wearing?: Pull over shirt, Orthosis    Upper body assist Assist Level: Moderate Assistance - Patient 50 - 74%     Lower Body Dressing/Undressing Lower body dressing      What is the patient wearing?: Pants     Lower body assist Assist for lower body dressing: Total Assistance - Patient < 25%     Toileting Toileting    Toileting assist Assist for toileting: Moderate Assistance - Patient 50 - 74%     Transfers Chair/bed transfer  Transfers assist     Chair/bed transfer assist level: Moderate Assistance - Patient 50 - 74%     Locomotion Ambulation   Ambulation assist      Assist level: Minimal Assistance - Patient > 75% Assistive device: Walker-rolling Max distance: 12   Walk 10 feet activity   Assist  Walk 10 feet activity did not occur: Safety/medical concerns  Assist level: Minimal Assistance - Patient > 75% Assistive device: Walker-rolling   Walk 50 feet activity   Assist Walk 50 feet with 2 turns activity did not occur: N/A  Assist level: Minimal Assistance - Patient > 75% Assistive device: Walker-rolling    Walk 150 feet activity   Assist Walk 150 feet activity did not occur: N/A         Walk 10 feet on uneven surface  activity   Assist Walk 10 feet on uneven surfaces activity did  not occur: N/A         Wheelchair     Assist Is the patient using a wheelchair?: No Type of Wheelchair: Manual    Wheelchair assist level: Total Assistance - Patient < 25% Max wheelchair distance: 10 ft    Wheelchair 50 feet with 2 turns activity    Assist        Assist Level: Total Assistance - Patient < 25%   Wheelchair 150 feet activity     Assist      Assist Level: Total Assistance - Patient < 25%   Blood pressure 118/78, pulse 94, temperature 99.1 F (37.3 C), temperature source Oral, resp. rate 18, height 6\' 2"  (1.88 m), weight (!) 147.3 kg, SpO2 96 %. Medical Problem List and Plan: 1.  Impaired mobility s/p TLIF for lumbar radiculoapthy             -patient may shower but incision must be covered             -ELOS/Goals: 14  days              -Continue CIR therapies including PT, OT   D/c 10/24 Con't PT and OT as tolerated with CT today.  2.  Antithrombotics: -DVT/anticoagulation:  Pharmaceutical: Lovenox             -antiplatelet therapy: N/a 3. Pain Management: Continue oxycodone prn. .             --scheduled Zanaflex increased to 4 mg TID             --Continue Gabapentin 600 mg TID (PTA)  -continue kpad for right thigh/leg  -ms contin 15mg  q12 started Friday  10/11- pain- still tight- esp R inner thigh adductors- and R low back- will add Robaxin 750 mg TID Prn.   10/12- will give 1 xValium dose and con't MS Contin- increase Oxy to 10-15 mg  10/13- needs CT with contrast today- will give another dose of Valium 5 mg as well as oxycodone prior to going.  4. Mood: LCSW to follow for evaluation and support.              -antipsychotic agents: N/A 5. Neuropsych: This patient is capable of making decisions on his own behalf. 6. Spinal incision: Monitor wound for healing. Routine pressure relief measures.              -started MVI as well as juven to help promote wound healing.              -given bogginess and drainage, continue doxycycline -primary team requested NSGY to follow along.  10/10- NSU called and following-   10/13- still significant/saturating drainage- Hb stable.  7. Fluids/Electrolytes/Nutrition: Monitor I/O--encourage fluid intake. 8. HTN; Did have issues with significant hypotension post op--continue to hold BP meds for now. -- orthostatic vitals were ordered.  9. T2DM: Hgb A1C-9.0. Add CM restrictions. Renal status stable--continue to encourage fluid intake.               ---Monitor BS ac/hs with SSI for elevated BS.              --discontinued levemir and resume metformin.             CBG (last 3)  Recent Labs    01/23/21 1712 01/23/21 2136 01/24/21 0604  GLUCAP 118* 111* 126*   10/9 cbg's under better control today. May need second med or resumption of insulin. Observe for  pattern  10/10- was more elevated today- added Glipizide 5 mg BID-AC- and will monitor- better BG control is good for wound healing.   10/13- BG's controlled- con't regimen 10. Acute blood loss anemia: Monitor for signs of bleeding. Recheck CBC on 10/10 11. Acute on chronic CKD?: Will recheck CMET on 10/10.  12. OSA: Continue CPAP at nights.  13.  Elevated CK: Question rhabdo due to statin. Continue to hold Crestor and recheck CK on 01/21/21.  10/10- Cr 1.18- doing better- con't to monitor  10/13- Cr/Bun elevated yesterday- gave IVFs- is better so will stop today.  14.  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   15. Infection. Klebsiella pneumonia bacteremia  10/10- procalcitonin 137! Will start of Cefipime and Vanc IV and cansult IM to help. Labs in AM  10/11- Procalcitonin- reduced to 124- will monitor and con't IV ABX- IM didn't feel anything else to do.   10/12- procalcitonin down to 80- on Cefipime IV- ID consulted yesterday and suggested CT of abd/pelvis- which shows no abscess, etc- just enlarged spleen- per wife, never had before that they were aware of- will ask Surgery to see if spleen size could be causing some of abd pain?  10/13- ID wants CT of lumbar spine with contrast- will order and get done- see what needs to be done- spoke with ID and PA about this.  16. Hypomagnesemia  10/10- will give MgOx 400 mg BID and recheck this week  10/11- DO IV Mg to help and recheck.   10/12- Mg up to 2.0- con't regimen 17. Constipation  10/10- LBM 3 days ago- could be cause of N/V this am x3- will give sorbitol x1 after therapy today.   10/11- no BM- will give enema to get cleaned out.   10/12- had BM yesterday AM- con't regimen 18. Hypokalemia  10/12- will replete KCL with 40 mEq x1 19. Azotemia  10/12- finish IVFs since Bun down to 16 and doing better  I spent a total of 42 minutes on total care- >50% on coordination of care- talking with ID, PA and pt's wife about plan.     LOS: 6 days A FACE TO FACE EVALUATION WAS PERFORMED  [name] 01/24/2021, 9:15 AM

## 2021-01-24 NOTE — Progress Notes (Signed)
[facility] Infectious Diseases Follow Up Note  Patient Identification: Patient Name: [name] MRN: [id number] [place] Date: 01/18/2021 11:52 AM Age: 55 y.o.Today's Date: 01/24/2021   Reason for Visit:GN bacteremia and lumbar wound  Principal Problem:   Lumbar radiculopathy  Antibiotics: Doxycyline 10/7-10/10                    Vancomycin 10/10-current                     Ceftriaxone 10/10-current                         Lines/Hardwares: Lumbar hardware  Interval Events: Continues to be afebrile, no leukocytosis.  CT abdomen pelvis unremarkable.  Continues to have significant drainage from the lumbar back wound  Assessment # Klebsiella pneumoniae bacteremia, High grade  UA unremarkable. No abdominal symptoms except mild nausea. No lines.Possiblly related to Lumbar wound  CT abdomen/pelvis is unremarkable Continues to have significant pain and drainage from the Lumbar back wound    # Recent TLIF with screws/hardware # Splenomegaly   Recommendations Will DC Vancomycin and continue ceftriaxone  Will follow up  repeat blood cx and TTE given high grade bacteremia and splenomegaly to r/o endocarditis I will order CT L spine w contrast  Follow NeuroSX recommendations  Monitor CBC and CMP Discussed with patient and Primary  Rest of the management as per the primary team. Thank you for the consult. Please page with pertinent questions or concerns.  ______________________________________________________________________ Subjective patient seen and examined at the bedside. Continues to have back pain and significant serous drainage from the lumbar wound. Denies any weakness, numbness or tingling in the Lower extremities. He is ambulatory.   Vitals BP 118/78 (BP Location: Left Arm)   Pulse 94   Temp 99.1 F (37.3 C) (Oral)   Resp 18   Ht 6\' 2"  (1.88 m) Comment: Simultaneous filing. User may not have seen previous data.  Wt  (!) 147.3 kg   SpO2 96%   BMI 41.69 kg/m     Physical Exam Constitutional:  Lying in bed, not in acute distress     Comments:   Cardiovascular:     Rate and Rhythm: Normal rate and regular rhythm.     Heart sounds: .   Pulmonary:     Effort: Pulmonary effort is normal.     Comments:   Abdominal:     Palpations: Abdomen is soft.     Tenderness: Non tender and obese abdomen   Musculoskeletal:        General: No swelling or tenderness.   Skin:    Comments:        Neurological:     General: Grossly non focal. Power 5/5 in bilateral lower /upper extremities   Psychiatric:        Mood and Affect: Mood normal.   Pertinent Microbiology Results for orders placed or performed during the hospital encounter of 01/18/21  Urine Culture     Status: None   Collection Time: 01/21/21  4:06 PM   Specimen: Urine, Catheterized  Result Value Ref Range Status   Specimen Description URINE, CATHETERIZED  Final   Special Requests Normal  Final   Culture   Final    NO GROWTH Performed at [facility], [address] [place], [place] [address]    Report Status 01/22/2021 FINAL  Final  Culture, blood (routine x 2)     Status:  Abnormal   Collection Time: 01/21/21  4:22 PM   Specimen: BLOOD  Result Value Ref Range Status   Specimen Description BLOOD RIGHT ANTECUBITAL  Final   Special Requests   Final    BOTTLES DRAWN AEROBIC AND ANAEROBIC Blood Culture results may not be optimal due to an excessive volume of blood received in culture bottles   Culture  Setup Time   Final    GRAM NEGATIVE RODS IN BOTH AEROBIC AND ANAEROBIC BOTTLES CRITICAL RESULT CALLED TO, READ BACK BY AND VERIFIED WITH: PHARMD M PHAN [id number] AT 52 BY CM Performed at [facility], [place] [address] [place], [place] [address]    Culture KLEBSIELLA PNEUMONIAE (A)  Final   Report Status 01/24/2021 FINAL  Final   Organism ID, Bacteria KLEBSIELLA PNEUMONIAE  Final      Susceptibility   Klebsiella  pneumoniae - MIC*    AMPICILLIN >=32 RESISTANT Resistant     CEFAZOLIN <=4 SENSITIVE Sensitive     CEFEPIME <=0.12 SENSITIVE Sensitive     CEFTAZIDIME <=1 SENSITIVE Sensitive     CEFTRIAXONE <=0.25 SENSITIVE Sensitive     CIPROFLOXACIN <=0.25 SENSITIVE Sensitive     GENTAMICIN <=1 SENSITIVE Sensitive     IMIPENEM <=0.25 SENSITIVE Sensitive     TRIMETH/SULFA <=20 SENSITIVE Sensitive     AMPICILLIN/SULBACTAM 4 SENSITIVE Sensitive     PIP/TAZO <=4 SENSITIVE Sensitive     * KLEBSIELLA PNEUMONIAE  Blood Culture ID Panel (Reflexed)     Status: Abnormal   Collection Time: 01/21/21  4:22 PM  Result Value Ref Range Status   Enterococcus faecalis NOT DETECTED NOT DETECTED Final   Enterococcus Faecium NOT DETECTED NOT DETECTED Final   Listeria monocytogenes NOT DETECTED NOT DETECTED Final   Staphylococcus species NOT DETECTED NOT DETECTED Final   Staphylococcus aureus (BCID) NOT DETECTED NOT DETECTED Final   Staphylococcus epidermidis NOT DETECTED NOT DETECTED Final   Staphylococcus lugdunensis NOT DETECTED NOT DETECTED Final   Streptococcus species NOT DETECTED NOT DETECTED Final   Streptococcus agalactiae NOT DETECTED NOT DETECTED Final   Streptococcus pneumoniae NOT DETECTED NOT DETECTED Final   Streptococcus pyogenes NOT DETECTED NOT DETECTED Final   A.calcoaceticus-baumannii NOT DETECTED NOT DETECTED Final   Bacteroides fragilis NOT DETECTED NOT DETECTED Final   Enterobacterales DETECTED (A) NOT DETECTED Final    Comment: CRITICAL RESULT CALLED TO, READ BACK BY AND VERIFIED WITH: PHARMD M PHAM [id number] AT 1258 BY CM    Enterobacter cloacae complex NOT DETECTED NOT DETECTED Final   Escherichia coli NOT DETECTED NOT DETECTED Final   Klebsiella aerogenes NOT DETECTED NOT DETECTED Final   Klebsiella oxytoca NOT DETECTED NOT DETECTED Final   Klebsiella pneumoniae DETECTED (A) NOT DETECTED Final    Comment: CRITICAL RESULT CALLED TO, READ BACK BY AND VERIFIED WITH: PHARMD M PHAN [id number] AT  12580 BY CM    Proteus species NOT DETECTED NOT DETECTED Final   Salmonella species NOT DETECTED NOT DETECTED Final   Serratia marcescens NOT DETECTED NOT DETECTED Final   Haemophilus influenzae NOT DETECTED NOT DETECTED Final   Neisseria meningitidis NOT DETECTED NOT DETECTED Final   Pseudomonas aeruginosa NOT DETECTED NOT DETECTED Final   Stenotrophomonas maltophilia NOT DETECTED NOT DETECTED Final   Candida albicans NOT DETECTED NOT DETECTED Final   Candida auris NOT DETECTED NOT DETECTED Final   Candida glabrata NOT DETECTED NOT DETECTED Final   Candida krusei NOT DETECTED NOT DETECTED Final   Candida parapsilosis NOT DETECTED NOT DETECTED Final  Candida tropicalis NOT DETECTED NOT DETECTED Final   Cryptococcus neoformans/gattii NOT DETECTED NOT DETECTED Final   CTX-M ESBL NOT DETECTED NOT DETECTED Final   Carbapenem resistance IMP NOT DETECTED NOT DETECTED Final   Carbapenem resistance KPC NOT DETECTED NOT DETECTED Final   Carbapenem resistance NDM NOT DETECTED NOT DETECTED Final   Carbapenem resist OXA 48 LIKE NOT DETECTED NOT DETECTED Final   Carbapenem resistance VIM NOT DETECTED NOT DETECTED Final    Comment: Performed at [facility], [place] [address], [place], [place] [address]  Culture, blood (routine x 2)     Status: Abnormal   Collection Time: 01/21/21  4:23 PM   Specimen: BLOOD  Result Value Ref Range Status   Specimen Description BLOOD RIGHT ANTECUBITAL  Final   Special Requests   Final    BOTTLES DRAWN AEROBIC AND ANAEROBIC Blood Culture results may not be optimal due to an excessive volume of blood received in culture bottles   Culture  Setup Time   Final    GRAM NEGATIVE RODS IN BOTH AEROBIC AND ANAEROBIC BOTTLES    Culture (A)  Final    KLEBSIELLA PNEUMONIAE SUSCEPTIBILITIES PERFORMED ON PREVIOUS CULTURE WITHIN THE LAST 5 DAYS. Performed at [facility], [place] [address], [place], [place] [address]    Report Status 01/24/2021 FINAL  Final  Culture,  blood (routine x 2)     Status: None (Preliminary result)   Collection Time: 01/23/21 11:36 AM   Specimen: BLOOD  Result Value Ref Range Status   Specimen Description BLOOD RIGHT ANTECUBITAL  Final   Special Requests IN PEDIATRIC BOTTLE Blood Culture adequate volume  Final   Culture   Final    NO GROWTH < 24 HOURS Performed at [facility], [place] [address], [place], [place] [address]    Report Status PENDING  Incomplete  Culture, blood (routine x 2)     Status: None (Preliminary result)   Collection Time: 01/23/21 11:45 AM   Specimen: BLOOD  Result Value Ref Range Status   Specimen Description BLOOD LEFT ANTECUBITAL  Final   Special Requests IN PEDIATRIC BOTTLE Blood Culture adequate volume  Final   Culture   Final    NO GROWTH < 24 HOURS Performed at [facility], [place] [address] [place], [place] [address]    Report Status PENDING  Incomplete    Pertinent Lab. CBC Latest Ref Rng & Units 01/24/2021 01/23/2021 01/22/2021  WBC 4.0 - 10.5 K/uL 4.0 5.7 9.4  Hemoglobin 13.0 - 17.0 g/dL 8.4(L) 8.1(L) 8.4(L)  Hematocrit 39.0 - 52.0 % 24.4(L) 23.7(L) 24.5(L)  Platelets 150 - 400 K/uL 206 200 205   CMP Latest Ref Rng & Units 01/24/2021 01/23/2021 01/22/2021  Glucose 70 - 99 mg/dL 118(H) 120(H) 174(H)  BUN 6 - 20 mg/dL 15 16 21(H)  Creatinine 0.61 - 1.24 mg/dL 0.92 0.96 1.05  Sodium 135 - 145 mmol/L 133(L) 130(L) 128(L)  Potassium 3.5 - 5.1 mmol/L 3.7 3.4(L) 3.5  Chloride 98 - 111 mmol/L 99 95(L) 96(L)  CO2 22 - 32 mmol/L 24 25 25   Calcium 8.9 - 10.3 mg/dL 8.1(L) 7.9(L) 7.8(L)  Total Protein 6.5 - 8.1 g/dL - - 5.3(L)  Total Bilirubin 0.3 - 1.2 mg/dL - - 1.1  Alkaline Phos 38 - 126 U/L - - 72  AST 15 - 41 U/L - - 22  ALT 0 - 44 U/L - - 23     Pertinent Imaging today Plain films and CT  images have been personally visualized and interpreted; radiology reports have been reviewed. Decision making incorporated into the Impression / Recommendations.  CT abdomen pelvis  01/24/21 FINDINGS: Lower chest: No acute pleural or parenchymal lung disease.   Hepatobiliary: No focal liver abnormality is seen. No gallstones, gallbladder wall thickening, or biliary dilatation.   Pancreas: Unremarkable. No pancreatic ductal dilatation or surrounding inflammatory changes.   Spleen: Spleen is enlarged, measuring 18.8 x 17.4 x 10.1 cm. No focal parenchymal abnormality.   Adrenals/Urinary Tract: Adrenal glands are unremarkable. Kidneys are normal, without renal calculi, focal lesion, or hydronephrosis. Bladder decompressed by Foley catheter.   Stomach/Bowel: No bowel obstruction or ileus. Normal appendix right lower quadrant. No bowel wall thickening or inflammatory change.   Vascular/Lymphatic: No significant vascular findings are present. No enlarged abdominal or pelvic lymph nodes.   Reproductive: Prostate is unremarkable.   Other: No free intraperitoneal fluid or free gas. No abdominal wall hernia.   Musculoskeletal: Postsurgical changes are seen from discectomies at L1-2, L2-3, L3-4, L4-5, and L5-S1. Laminectomy and posterior fusion identified spanning L1 through S1. No evidence of metallic hardware loosening or failure. Postsurgical changes are seen within the midline lower back. Evaluation of the central canal is limited due to streak artifact. Minimal fluid within the subcutaneous tissues likely postoperative seroma. No rim enhancing fluid collection to suggest abscess. Minimal gas adjacent to the T12 spinous process likely postsurgical.   No acute displaced fractures. Reconstructed images demonstrate no additional findings.   IMPRESSION: 1. Extensive postsurgical changes throughout the lumbar spine as above. 2. Splenomegaly. 3. Otherwise no acute intra-abdominal or intrapelvic process.    I spent more than 35 minutes for this patient encounter including review of prior medical records, coordination of care  with greater than 50% of time being  face to face/counseling and discussing diagnostics/treatment plan with the patient/family.  Electronically signed by:   Rosiland Oz, MD Infectious Disease Physician [facility] for Infectious Disease Pager: [phone]

## 2021-01-24 NOTE — Progress Notes (Signed)
   Providing Compassionate, Quality Care - Together  NEUROSURGERY PROGRESS NOTE   S: pt s/e, persistent wound drainage  O: EXAM:  BP 118/78 (BP Location: Left Arm)   Pulse 94   Temp 99.1 F (37.3 C) (Oral)   Resp 18   Ht 6\' 2"  (1.88 m) Comment: Simultaneous filing. User may not have seen previous data.  Wt (!) 147.3 kg   SpO2 96%   BMI 41.69 kg/m   Awake, alert, oriented  Speech fluent, appropriate  CNs grossly intact  Wound w serosang drainage, some fluctuance No active purulence  ASSESSMENT:  55 y.o. male with   Lumbar spondylosis 2. Wound infection, possible  S/p L1-S1 TLIF   PLAN: - I/D tomorrow with myself or Dr. Zada Finders, all risks/benefits explained    Thank you for allowing me to participate in this patient's care.  Please do not hesitate to call with questions or concerns.   Elwin [name], [facility] Cell: [phone]

## 2021-01-24 NOTE — Progress Notes (Signed)
Physical Therapy Session Note  Patient Details  Name: [name] MRN: [id number] Date of Birth: 21-Aug-1965  Today's Date: 01/24/2021 PT Individual Time: 0900-1000 PT Individual Time Calculation (min): 60 min   Short Term Goals: Week 1:  PT Short Term Goal 1 (Week 1): Pt will perform bed mobility with CGA while adhering to back precautions. PT Short Term Goal 2 (Week 1): Pt will perform standing transfers with consistent CGA. PT Short Term Goal 3 (Week 1): Pt will ambulate at least 30 feet consistently with CGA and LRAD. PT Short Term Goal 4 (Week 1): Pt will initiate stair negotiation training. PT Short Term Goal 5 (Week 1): Pt will correctly don lumbar corset with supervision and min cues. Week 2:    Week 3:     Skilled Therapeutic Interventions/Progress Updates:   Pt initially supine.  States bed is very uncomfortable, nursing reports bed being swithced/requested pt OOB for this.  Pt also reports his dressing is "full".  Nursing contacted and agreed to assist w/dressing change.  Pt supine to sit w/use of bed rails, hob elevated, and overall min assist, additional time.  Static sit w/supervision during dressing change, PA in to inspect wound.  Profuse drainage noted/saturated dressing.   Therapist assisted w/donning TLSO to secure dressing per nursing request.  Pt assists donning/overall mod assist. Sit to stand w/mod to min assist to RW w/bed elevated.  Gait x 80ft  and commode transfer w/cga, assist to manage clothing and brace w/transition.  Total assist for pericare following toileting. Max assist for raising paints. Nts in to change bed for improved comfort.  Gait 58ft to wc w/RW, cga, cues for safety. Gait 161ft w/RW w/cga, turn/sit to wc w/cga.  Pt left oob in wc w/alarm belt set and needs in reach Therapy Documentation Precautions:  Precautions Precautions: Fall, Back Precaution Booklet Issued: No Precaution Comments: pt able to recall 3/3 back precautions with ind when  prompted Required Braces or Orthoses: Spinal Brace Spinal Brace: Lumbar corset, Applied in sitting position Other Brace: for comfort, donned seated Restrictions Weight Bearing Restrictions: No     Therapy/Group: Individual Therapy Callie Fielding, [place] 01/24/2021, 4:47 PM

## 2021-01-24 NOTE — Progress Notes (Signed)
Physical Therapy Session Note  Patient Details  Name: [name] MRN: [id number] Date of Birth: [birth year]  Today's Date: 01/24/2021  Short Term Goals: Week 1:  PT Short Term Goal 1 (Week 1): Pt will perform bed mobility with CGA while adhering to back precautions. PT Short Term Goal 2 (Week 1): Pt will perform standing transfers with consistent CGA. PT Short Term Goal 3 (Week 1): Pt will ambulate at least 30 feet consistently with CGA and LRAD. PT Short Term Goal 4 (Week 1): Pt will initiate stair negotiation training. PT Short Term Goal 5 (Week 1): Pt will correctly don lumbar corset with supervision and min cues.   Skilled Therapeutic Interventions/Progress Updates:   Pt received supine in bed. RN present to administer pain medication. Pt reporting 9/10-10/10 pain in the R side of back following CT scan performed earlier in the day, and requesting to rest for the remainder of the day. PT encouraged bed level activity, but pt reports pain too much for any activity at this time. Left in bed with call bell in reach and all needs met.   Therapy Documentation Precautions:  Precautions Precautions: Fall, Back Precaution Booklet Issued: No Precaution Comments: pt able to recall 3/3 back precautions with ind when prompted Required Braces or Orthoses: Spinal Brace Spinal Brace: Lumbar corset, Applied in sitting position Other Brace: for comfort, donned seated Restrictions Weight Bearing Restrictions: No General: PT Amount of Missed Time (min): 30 Minutes PT Missed Treatment Reason: Pain      Therapy/Group: Individual Therapy  Lorie Phenix 01/24/2021, 5:33 PM

## 2021-01-24 NOTE — Progress Notes (Signed)
Patient schedules for an CT scan. Per order patient is to receive Valium and OXY 1/2 hour prior to scan. CT called and states they are picking patient up at 1 pm. Medication given as ordered,  no questions or concerns from the patient at this time.

## 2021-01-24 NOTE — Progress Notes (Signed)
Occupational Therapy Session Note  Patient Details  Name: [name] MRN: [id number] Date of Birth: Aug 24, 1965  Today's Date: 01/24/2021 OT Missed Time: 32 Minutes Missed Time Reason: Pain;Unavailable (comment) (imaging)   Short Term Goals: Week 1:  OT Short Term Goal 1 (Week 1): Pt will complete grooming task in standing with CGA. OT Short Term Goal 2 (Week 1): Pt will complete ambulatory toilet transfer with min A + LRAD. OT Short Term Goal 3 (Week 1): Pt will don pants with mod A + AE PRN.   Skilled Therapeutic Interventions/Progress Updates:    Pt greeted at time of session supine in bed with imaging present for scan/imaging. Therapist returning every 20 minutes with imaging ongoing. Once imaging left, pt in too much pain to participate in session. Missed 60 mins of OT 2/2 imaging and pain.    Therapy Documentation Precautions:  Precautions Precautions: Fall, Back Precaution Booklet Issued: No Precaution Comments: pt able to recall 3/3 back precautions with ind when prompted Required Braces or Orthoses: Spinal Brace Spinal Brace: Lumbar corset, Applied in sitting position Other Brace: for comfort, donned seated Restrictions Weight Bearing Restrictions: No    Therapy/Group: Individual Therapy  Viona Gilmore 01/24/2021, 7:26 AM

## 2021-01-24 NOTE — Progress Notes (Signed)
Physical Therapy Session Note  Patient Details  Name: [name] MRN: [id number] Date of Birth: May 07, 1965  Today's Date: 01/24/2021 PT Individual Time: 1115-1200 PT Individual Time Calculation (min): 45 min   Short Term Goals: Week 1:  PT Short Term Goal 1 (Week 1): Pt will perform bed mobility with CGA while adhering to back precautions. PT Short Term Goal 2 (Week 1): Pt will perform standing transfers with consistent CGA. PT Short Term Goal 3 (Week 1): Pt will ambulate at least 30 feet consistently with CGA and LRAD. PT Short Term Goal 4 (Week 1): Pt will initiate stair negotiation training. PT Short Term Goal 5 (Week 1): Pt will correctly don lumbar corset with supervision and min cues.  Skilled Therapeutic Interventions/Progress Updates: Pt presented in recliner agreeable to therapy. Pt c/o pain 9/10, nsg aware and pain meds provided during session. Pt's LSO noted to be up to chest. Pt was able to sit up and PTA adjusted with assist. Discussed with pt being aware of LSO positioning and correcting when necessary. Pt awaiting transport for CT scan therefore agreeable to remain in room for session. Pt participated in toe taps 2 x 10 for dynamic balance and weight shifting, encouraged pt to maintain increased BOS so that heels wouldn't touch. Pt also participated in Sit to stand x 5 from recliner for BLE strengthening and continued compliance of spinal precautions. Pt performed ball taps with 3lb dowel x 20 with emphasis on pt maintaining unsupported sitting for increased core activation. Educated pt on continuing work on unsupported sitting for increased core activation. Pt left seated in recliner at end of session with lunch tray set up, call bell within reach and current needs met.      Therapy Documentation Precautions:  Precautions Precautions: Fall, Back Precaution Booklet Issued: No Precaution Comments: pt able to recall 3/3 back precautions with ind when prompted Required Braces  or Orthoses: Spinal Brace Spinal Brace: Lumbar corset, Applied in sitting position Other Brace: for comfort, donned seated Restrictions Weight Bearing Restrictions: No General:   Vital Signs:   Pain: Pain Assessment Pain Score: 3  Mobility:   Locomotion :    Trunk/Postural Assessment :    Balance:   Exercises:   Other Treatments:      Therapy/Group: Individual Therapy  [name] 01/24/2021, 4:02 PM

## 2021-01-25 ENCOUNTER — Inpatient Hospital Stay (HOSPITAL_COMMUNITY): Payer: 59 | Admitting: Anesthesiology

## 2021-01-25 ENCOUNTER — Inpatient Hospital Stay (HOSPITAL_COMMUNITY)
Admission: AD | Admit: 2021-01-25 | Discharge: 2021-01-31 | DRG: 857 | Disposition: A | Discharge status: Rehabilitation Facility | Payer: 59 | Source: Intra-hospital | Attending: Neurological Surgery | Admitting: Neurological Surgery

## 2021-01-25 ENCOUNTER — Encounter (HOSPITAL_COMMUNITY)
Admission: AD | Disposition: A | Discharge status: Rehabilitation Facility | Payer: Self-pay | Source: Home / Self Care | Attending: Neurological Surgery

## 2021-01-25 ENCOUNTER — Encounter (HOSPITAL_COMMUNITY): Payer: Self-pay | Admitting: Certified Registered Nurse Anesthetist

## 2021-01-25 ENCOUNTER — Encounter (HOSPITAL_COMMUNITY): Payer: Self-pay | Admitting: Neurological Surgery

## 2021-01-25 ENCOUNTER — Inpatient Hospital Stay (HOSPITAL_COMMUNITY)
Admission: RE | Disposition: A | Discharge status: Other Acute Inpatient Hospital | Payer: Self-pay | Source: Intra-hospital | Attending: Physical Medicine and Rehabilitation

## 2021-01-25 ENCOUNTER — Encounter (HOSPITAL_COMMUNITY): Payer: Self-pay | Admitting: Physical Medicine and Rehabilitation

## 2021-01-25 DIAGNOSIS — Z981 Arthrodesis status: Secondary | ICD-10-CM

## 2021-01-25 DIAGNOSIS — Z888 Allergy status to other drugs, medicaments and biological substances status: Secondary | ICD-10-CM | POA: Diagnosis not present

## 2021-01-25 DIAGNOSIS — M5416 Radiculopathy, lumbar region: Secondary | ICD-10-CM | POA: Diagnosis not present

## 2021-01-25 DIAGNOSIS — Y838 Other surgical procedures as the cause of abnormal reaction of the patient, or of later complication, without mention of misadventure at the time of the procedure: Secondary | ICD-10-CM | POA: Diagnosis present

## 2021-01-25 DIAGNOSIS — G4733 Obstructive sleep apnea (adult) (pediatric): Secondary | ICD-10-CM | POA: Diagnosis present

## 2021-01-25 DIAGNOSIS — B961 Klebsiella pneumoniae [K. pneumoniae] as the cause of diseases classified elsewhere: Secondary | ICD-10-CM | POA: Diagnosis present

## 2021-01-25 DIAGNOSIS — T8149XA Infection following a procedure, other surgical site, initial encounter: Secondary | ICD-10-CM | POA: Diagnosis present

## 2021-01-25 DIAGNOSIS — R7881 Bacteremia: Secondary | ICD-10-CM | POA: Diagnosis not present

## 2021-01-25 DIAGNOSIS — E119 Type 2 diabetes mellitus without complications: Secondary | ICD-10-CM | POA: Diagnosis present

## 2021-01-25 DIAGNOSIS — Z7984 Long term (current) use of oral hypoglycemic drugs: Secondary | ICD-10-CM

## 2021-01-25 DIAGNOSIS — Z880 Allergy status to penicillin: Secondary | ICD-10-CM | POA: Diagnosis not present

## 2021-01-25 DIAGNOSIS — T8141XA Infection following a procedure, superficial incisional surgical site, initial encounter: Secondary | ICD-10-CM | POA: Diagnosis present

## 2021-01-25 DIAGNOSIS — I10 Essential (primary) hypertension: Secondary | ICD-10-CM | POA: Diagnosis present

## 2021-01-25 DIAGNOSIS — E785 Hyperlipidemia, unspecified: Secondary | ICD-10-CM | POA: Diagnosis present

## 2021-01-25 DIAGNOSIS — Z79899 Other long term (current) drug therapy: Secondary | ICD-10-CM

## 2021-01-25 DIAGNOSIS — M5106 Intervertebral disc disorders with myelopathy, lumbar region: Secondary | ICD-10-CM | POA: Diagnosis not present

## 2021-01-25 DIAGNOSIS — Z6841 Body Mass Index (BMI) 40.0 and over, adult: Secondary | ICD-10-CM | POA: Diagnosis not present

## 2021-01-25 DIAGNOSIS — M62838 Other muscle spasm: Secondary | ICD-10-CM

## 2021-01-25 DIAGNOSIS — N319 Neuromuscular dysfunction of bladder, unspecified: Secondary | ICD-10-CM | POA: Diagnosis present

## 2021-01-25 DIAGNOSIS — R109 Unspecified abdominal pain: Secondary | ICD-10-CM

## 2021-01-25 DIAGNOSIS — A498 Other bacterial infections of unspecified site: Secondary | ICD-10-CM

## 2021-01-25 DIAGNOSIS — Z833 Family history of diabetes mellitus: Secondary | ICD-10-CM | POA: Diagnosis not present

## 2021-01-25 DIAGNOSIS — R509 Fever, unspecified: Secondary | ICD-10-CM | POA: Diagnosis present

## 2021-01-25 DIAGNOSIS — Z7982 Long term (current) use of aspirin: Secondary | ICD-10-CM | POA: Diagnosis not present

## 2021-01-25 DIAGNOSIS — R161 Splenomegaly, not elsewhere classified: Secondary | ICD-10-CM

## 2021-01-25 DIAGNOSIS — R2689 Other abnormalities of gait and mobility: Secondary | ICD-10-CM | POA: Diagnosis not present

## 2021-01-25 HISTORY — PX: LUMBAR WOUND DEBRIDEMENT: SHX1988

## 2021-01-25 HISTORY — PX: APPLICATION OF WOUND VAC: SHX5189

## 2021-01-25 HISTORY — DX: Unspecified abdominal pain: R10.9

## 2021-01-25 LAB — GLUCOSE, CAPILLARY
Glucose-Capillary: 111 mg/dL — ABNORMAL HIGH (ref 70–99)
Glucose-Capillary: 104 mg/dL — ABNORMAL HIGH (ref 70–99)
Glucose-Capillary: 107 mg/dL — ABNORMAL HIGH (ref 70–99)
Glucose-Capillary: 105 mg/dL — ABNORMAL HIGH (ref 70–99)

## 2021-01-25 LAB — SURGICAL PCR SCREEN
MRSA, PCR: POSITIVE — AB
Staphylococcus aureus: POSITIVE — AB

## 2021-01-25 LAB — SARS CORONAVIRUS 2 BY RT PCR (HOSPITAL ORDER, PERFORMED IN ~~LOC~~ HOSPITAL LAB): SARS Coronavirus 2: NEGATIVE

## 2021-01-25 SURGERY — LUMBAR WOUND DEBRIDEMENT
Anesthesia: General | Site: Spine Lumbar

## 2021-01-25 MED ORDER — THROMBIN 5000 UNITS EX SOLR: OROMUCOSAL | Status: DC | PRN

## 2021-01-25 MED ORDER — ONDANSETRON HCL 4 MG/2ML IJ SOLN
INTRAMUSCULAR | Status: DC | PRN
  Administered 2021-01-25: 4 mg via INTRAVENOUS

## 2021-01-25 MED ORDER — LOSARTAN POTASSIUM-HCTZ 100-12.5 MG PO TABS: 1.0000 | ORAL_TABLET | Freq: Every day | ORAL | Status: DC

## 2021-01-25 MED ORDER — ACETAMINOPHEN 650 MG RE SUPP: 650.0000 mg | RECTAL | Status: DC | PRN

## 2021-01-25 MED ORDER — HYDROMORPHONE HCL 1 MG/ML IJ SOLN
0.5000 mg | INTRAMUSCULAR | Status: DC | PRN
  Filled 2021-01-25: qty 0.5

## 2021-01-25 MED ORDER — SODIUM CHLORIDE 0.9 % IV SOLN
2.0000 g | INTRAVENOUS | Status: DC
  Administered 2021-01-25: 2 g via INTRAVENOUS
  Filled 2021-01-25: qty 20

## 2021-01-25 MED ORDER — CHLORHEXIDINE GLUCONATE 0.12 % MT SOLN
OROMUCOSAL | Status: AC
  Filled 2021-01-25: qty 15

## 2021-01-25 MED ORDER — PROPOFOL 10 MG/ML IV BOLUS
INTRAVENOUS | Status: DC | PRN
  Administered 2021-01-25: 150 mg via INTRAVENOUS

## 2021-01-25 MED ORDER — ROCURONIUM BROMIDE 10 MG/ML (PF) SYRINGE
PREFILLED_SYRINGE | INTRAVENOUS | Status: AC
  Filled 2021-01-25: qty 10

## 2021-01-25 MED ORDER — SUGAMMADEX SODIUM 200 MG/2ML IV SOLN
INTRAVENOUS | Status: DC | PRN
  Administered 2021-01-25: 300 mg via INTRAVENOUS

## 2021-01-25 MED ORDER — OXYCODONE HCL 5 MG PO TABS
10.0000 mg | ORAL_TABLET | ORAL | Status: DC | PRN
Start: 2021-01-25 — End: 2021-01-28
  Administered 2021-01-25 – 2021-01-28 (×11): 10 mg via ORAL
  Filled 2021-01-25 (×13): qty 2

## 2021-01-25 MED ORDER — 0.9 % SODIUM CHLORIDE (POUR BTL) OPTIME
TOPICAL | Status: DC | PRN
  Administered 2021-01-25: 1000 mL

## 2021-01-25 MED ORDER — ONDANSETRON HCL 4 MG/2ML IJ SOLN: 4.0000 mg | Freq: Four times a day (QID) | INTRAMUSCULAR | Status: DC | PRN

## 2021-01-25 MED ORDER — THROMBIN 5000 UNITS EX SOLR
CUTANEOUS | Status: AC
  Filled 2021-01-25: qty 5000

## 2021-01-25 MED ORDER — SENNA 8.6 MG PO TABS
1.0000 | ORAL_TABLET | Freq: Two times a day (BID) | ORAL | Status: DC
  Administered 2021-01-25 – 2021-01-31 (×12): 8.6 mg via ORAL
  Filled 2021-01-25 (×12): qty 1

## 2021-01-25 MED ORDER — MORPHINE SULFATE (PF) 2 MG/ML IV SOLN
2.0000 mg | INTRAVENOUS | Status: DC | PRN
  Administered 2021-01-25 – 2021-01-28 (×6): 2 mg via INTRAVENOUS
  Filled 2021-01-25 (×6): qty 1

## 2021-01-25 MED ORDER — LIDOCAINE 2% (20 MG/ML) 5 ML SYRINGE
INTRAMUSCULAR | Status: AC
  Filled 2021-01-25: qty 5

## 2021-01-25 MED ORDER — EPHEDRINE 5 MG/ML INJ
INTRAVENOUS | Status: AC
  Filled 2021-01-25: qty 5

## 2021-01-25 MED ORDER — SUCCINYLCHOLINE CHLORIDE 200 MG/10ML IV SOSY
PREFILLED_SYRINGE | INTRAVENOUS | Status: AC
  Filled 2021-01-25: qty 10

## 2021-01-25 MED ORDER — DEXAMETHASONE SODIUM PHOSPHATE 10 MG/ML IJ SOLN
INTRAMUSCULAR | Status: DC | PRN
  Administered 2021-01-25: 5 mg via INTRAVENOUS

## 2021-01-25 MED ORDER — CEFAZOLIN SODIUM-DEXTROSE 2-4 GM/100ML-% IV SOLN
2.0000 g | Freq: Three times a day (TID) | INTRAVENOUS | Status: DC
  Filled 2021-01-25 (×2): qty 100

## 2021-01-25 MED ORDER — FENTANYL CITRATE (PF) 100 MCG/2ML IJ SOLN: 25.0000 ug | INTRAMUSCULAR | Status: DC | PRN

## 2021-01-25 MED ORDER — SUCCINYLCHOLINE CHLORIDE 200 MG/10ML IV SOSY
PREFILLED_SYRINGE | INTRAVENOUS | Status: DC | PRN
  Administered 2021-01-25: 140 mg via INTRAVENOUS

## 2021-01-25 MED ORDER — AMISULPRIDE (ANTIEMETIC) 5 MG/2ML IV SOLN: 10.0000 mg | Freq: Once | INTRAVENOUS | Status: DC | PRN

## 2021-01-25 MED ORDER — ACETAMINOPHEN 10 MG/ML IV SOLN: 1000.0000 mg | Freq: Once | INTRAVENOUS | Status: DC | PRN

## 2021-01-25 MED ORDER — ORAL CARE MOUTH RINSE: 15.0000 mL | Freq: Once | OROMUCOSAL | Status: AC

## 2021-01-25 MED ORDER — LACTATED RINGERS IV SOLN: INTRAVENOUS | Status: DC

## 2021-01-25 MED ORDER — LOSARTAN POTASSIUM 50 MG PO TABS
100.0000 mg | ORAL_TABLET | Freq: Every day | ORAL | Status: DC
  Administered 2021-01-25 – 2021-01-31 (×6): 100 mg via ORAL
  Filled 2021-01-25 (×7): qty 2

## 2021-01-25 MED ORDER — PROMETHAZINE HCL 25 MG/ML IJ SOLN: 6.2500 mg | INTRAMUSCULAR | Status: DC | PRN

## 2021-01-25 MED ORDER — MIDAZOLAM HCL 2 MG/2ML IJ SOLN
INTRAMUSCULAR | Status: AC
  Filled 2021-01-25: qty 2

## 2021-01-25 MED ORDER — HYDROCHLOROTHIAZIDE 12.5 MG PO CAPS
12.5000 mg | ORAL_CAPSULE | Freq: Every day | ORAL | Status: DC
  Administered 2021-01-25 – 2021-01-31 (×6): 12.5 mg via ORAL
  Filled 2021-01-25 (×7): qty 1

## 2021-01-25 MED ORDER — PROPOFOL 10 MG/ML IV BOLUS
INTRAVENOUS | Status: AC
  Filled 2021-01-25: qty 20

## 2021-01-25 MED ORDER — SODIUM CHLORIDE 0.9% FLUSH: 3.0000 mL | INTRAVENOUS | Status: DC | PRN

## 2021-01-25 MED ORDER — OXYCODONE HCL 5 MG PO TABS: 5.0000 mg | ORAL_TABLET | Freq: Once | ORAL | Status: DC | PRN

## 2021-01-25 MED ORDER — DEXAMETHASONE SODIUM PHOSPHATE 10 MG/ML IJ SOLN
INTRAMUSCULAR | Status: AC
  Filled 2021-01-25: qty 1

## 2021-01-25 MED ORDER — ROCURONIUM BROMIDE 10 MG/ML (PF) SYRINGE
PREFILLED_SYRINGE | INTRAVENOUS | Status: DC | PRN
  Administered 2021-01-25: 40 mg via INTRAVENOUS

## 2021-01-25 MED ORDER — CHLORHEXIDINE GLUCONATE 0.12 % MT SOLN
15.0000 mL | Freq: Once | OROMUCOSAL | Status: AC
  Administered 2021-01-25: 15 mL via OROMUCOSAL

## 2021-01-25 MED ORDER — ROSUVASTATIN CALCIUM 20 MG PO TABS
20.0000 mg | ORAL_TABLET | Freq: Every day | ORAL | Status: DC
  Administered 2021-01-25 – 2021-01-31 (×7): 20 mg via ORAL
  Filled 2021-01-25 (×7): qty 1

## 2021-01-25 MED ORDER — OXYCODONE HCL 5 MG/5ML PO SOLN: 5.0000 mg | Freq: Once | ORAL | Status: DC | PRN

## 2021-01-25 MED ORDER — GABAPENTIN 600 MG PO TABS
600.0000 mg | ORAL_TABLET | Freq: Three times a day (TID) | ORAL | Status: DC
  Administered 2021-01-25 – 2021-01-31 (×17): 600 mg via ORAL
  Filled 2021-01-25 (×17): qty 1

## 2021-01-25 MED ORDER — PHENOL 1.4 % MT LIQD: 1.0000 | OROMUCOSAL | Status: DC | PRN

## 2021-01-25 MED ORDER — LIDOCAINE 2% (20 MG/ML) 5 ML SYRINGE
INTRAMUSCULAR | Status: DC | PRN
  Administered 2021-01-25: 60 mg via INTRAVENOUS

## 2021-01-25 MED ORDER — PHENYLEPHRINE 40 MCG/ML (10ML) SYRINGE FOR IV PUSH (FOR BLOOD PRESSURE SUPPORT)
PREFILLED_SYRINGE | INTRAVENOUS | Status: AC
  Filled 2021-01-25: qty 10

## 2021-01-25 MED ORDER — METFORMIN HCL 500 MG PO TABS
1000.0000 mg | ORAL_TABLET | Freq: Two times a day (BID) | ORAL | Status: DC
  Administered 2021-01-26 – 2021-01-31 (×11): 1000 mg via ORAL
  Filled 2021-01-25 (×11): qty 2

## 2021-01-25 MED ORDER — ONDANSETRON HCL 4 MG PO TABS: 4.0000 mg | ORAL_TABLET | Freq: Four times a day (QID) | ORAL | Status: DC | PRN

## 2021-01-25 MED ORDER — FENTANYL CITRATE (PF) 250 MCG/5ML IJ SOLN
INTRAMUSCULAR | Status: DC | PRN
  Administered 2021-01-25: 100 ug via INTRAVENOUS
  Administered 2021-01-25: 50 ug via INTRAVENOUS

## 2021-01-25 MED ORDER — MENTHOL 3 MG MT LOZG: 1.0000 | LOZENGE | OROMUCOSAL | Status: DC | PRN

## 2021-01-25 MED ORDER — ACETAMINOPHEN 325 MG PO TABS
650.0000 mg | ORAL_TABLET | ORAL | Status: DC | PRN
  Administered 2021-01-27 – 2021-01-31 (×4): 650 mg via ORAL
  Filled 2021-01-25 (×4): qty 2

## 2021-01-25 MED ORDER — TIZANIDINE HCL 4 MG PO TABS
4.0000 mg | ORAL_TABLET | Freq: Four times a day (QID) | ORAL | Status: DC | PRN
  Administered 2021-01-26 – 2021-01-31 (×13): 4 mg via ORAL
  Filled 2021-01-25 (×13): qty 1

## 2021-01-25 MED ORDER — MIDAZOLAM HCL 2 MG/2ML IJ SOLN
INTRAMUSCULAR | Status: DC | PRN
  Administered 2021-01-25: 2 mg via INTRAVENOUS

## 2021-01-25 MED ORDER — SODIUM CHLORIDE 0.9% FLUSH
3.0000 mL | Freq: Two times a day (BID) | INTRAVENOUS | Status: DC
  Administered 2021-01-26 – 2021-01-29 (×5): 3 mL via INTRAVENOUS

## 2021-01-25 MED ORDER — PHENYLEPHRINE 40 MCG/ML (10ML) SYRINGE FOR IV PUSH (FOR BLOOD PRESSURE SUPPORT)
PREFILLED_SYRINGE | INTRAVENOUS | Status: DC | PRN
  Administered 2021-01-25 (×4): 80 ug via INTRAVENOUS

## 2021-01-25 MED ORDER — SODIUM CHLORIDE 0.9 % IV SOLN: 250.0000 mL | INTRAVENOUS | Status: DC

## 2021-01-25 MED ORDER — ONDANSETRON HCL 4 MG/2ML IJ SOLN
INTRAMUSCULAR | Status: AC
  Filled 2021-01-25: qty 2

## 2021-01-25 MED ORDER — FENTANYL CITRATE (PF) 250 MCG/5ML IJ SOLN
INTRAMUSCULAR | Status: AC
  Filled 2021-01-25: qty 5

## 2021-01-25 SURGICAL SUPPLY — 60 items
BAG BANDED W/RUBBER/TAPE 36X54 (MISCELLANEOUS) IMPLANT
BAG COUNTER SPONGE SURGICOUNT (BAG) ×2 IMPLANT
BAG EQP BAND 135X91 W/RBR TAPE (MISCELLANEOUS)
BAG SPNG CNTER NS LX DISP (BAG) ×1
BUR 2.5 MTCH HD 16 (BUR) IMPLANT
CANISTER WOUND CARE 500ML ATS (WOUND CARE) ×1 IMPLANT
CARTRIDGE OIL MAESTRO DRILL (MISCELLANEOUS) ×1 IMPLANT
CNTNR URN SCR LID CUP LEK RST (MISCELLANEOUS) ×1 IMPLANT
CONT SPEC 4OZ STRL OR WHT (MISCELLANEOUS) ×2
COVER BACK TABLE 60X90IN (DRAPES) IMPLANT
COVER MAYO STAND STRL (DRAPES) IMPLANT
DECANTER SPIKE VIAL GLASS SM (MISCELLANEOUS) ×2 IMPLANT
DIFFUSER DRILL AIR PNEUMATIC (MISCELLANEOUS) ×2 IMPLANT
DRAIN JACKSON RD 7FR 3/32 (WOUND CARE) IMPLANT
DRAPE C-ARMOR (DRAPES) IMPLANT
DRAPE LAPAROTOMY 100X72X124 (DRAPES) ×2 IMPLANT
DRAPE SURG 17X23 STRL (DRAPES) ×2 IMPLANT
DRSG VAC ATS LRG SENSATRAC (GAUZE/BANDAGES/DRESSINGS) ×1 IMPLANT
DRSG VAC ATS SM SENSATRAC (GAUZE/BANDAGES/DRESSINGS) ×1 IMPLANT
DURAPREP 26ML APPLICATOR (WOUND CARE) ×2 IMPLANT
ELECT COATED BLADE 2.86 ST (ELECTRODE) ×2 IMPLANT
ELECT REM PT RETURN 9FT ADLT (ELECTROSURGICAL) ×2
ELECTRODE REM PT RTRN 9FT ADLT (ELECTROSURGICAL) ×1 IMPLANT
GAUZE 4X4 16PLY ~~LOC~~+RFID DBL (SPONGE) IMPLANT
GAUZE SPONGE 4X4 12PLY STRL (GAUZE/BANDAGES/DRESSINGS) ×2 IMPLANT
GLOVE SRG 8 PF TXTR STRL LF DI (GLOVE) ×2 IMPLANT
GLOVE SURG ENC MOIS LTX SZ7 (GLOVE) ×1 IMPLANT
GLOVE SURG ENC MOIS LTX SZ8 (GLOVE) ×2 IMPLANT
GLOVE SURG LTX SZ8 (GLOVE) ×4 IMPLANT
GLOVE SURG UNDER POLY LF SZ8 (GLOVE) ×8
GOWN STRL REUS W/ TWL LRG LVL3 (GOWN DISPOSABLE) IMPLANT
GOWN STRL REUS W/ TWL XL LVL3 (GOWN DISPOSABLE) ×1 IMPLANT
GOWN STRL REUS W/TWL 2XL LVL3 (GOWN DISPOSABLE) IMPLANT
GOWN STRL REUS W/TWL LRG LVL3 (GOWN DISPOSABLE)
GOWN STRL REUS W/TWL XL LVL3 (GOWN DISPOSABLE) ×2
KIT BASIN OR (CUSTOM PROCEDURE TRAY) ×2 IMPLANT
KIT POSITION SURG JACKSON T1 (MISCELLANEOUS) ×3 IMPLANT
KIT TURNOVER KIT B (KITS) ×2 IMPLANT
MARKER SKIN DUAL TIP RULER LAB (MISCELLANEOUS) ×2 IMPLANT
NDL HYPO 25X1 1.5 SAFETY (NEEDLE) ×1 IMPLANT
NEEDLE HYPO 25X1 1.5 SAFETY (NEEDLE) ×2 IMPLANT
NS IRRIG 1000ML POUR BTL (IV SOLUTION) ×2 IMPLANT
OIL CARTRIDGE MAESTRO DRILL (MISCELLANEOUS) ×2
PACK LAMINECTOMY NEURO (CUSTOM PROCEDURE TRAY) ×2 IMPLANT
PAD ARMBOARD 7.5X6 YLW CONV (MISCELLANEOUS) ×6 IMPLANT
PATTIES SURGICAL .5 X.5 (GAUZE/BANDAGES/DRESSINGS) IMPLANT
PATTIES SURGICAL .5 X1 (DISPOSABLE) IMPLANT
PATTIES SURGICAL 1X1 (DISPOSABLE) IMPLANT
SPONGE T-LAP 4X18 ~~LOC~~+RFID (SPONGE) IMPLANT
STAPLER VISISTAT 35W (STAPLE) IMPLANT
SUT VIC AB 0 CT1 27 (SUTURE) ×2
SUT VIC AB 0 CT1 27XBRD ANBCTR (SUTURE) ×1 IMPLANT
SUT VIC AB 2-0 CP2 18 (SUTURE) ×2 IMPLANT
SUT VIC AB 3-0 SH 8-18 (SUTURE) IMPLANT
SWAB COLLECTION DEVICE MRSA (MISCELLANEOUS) ×1 IMPLANT
SWAB CULTURE ESWAB REG 1ML (MISCELLANEOUS) ×1 IMPLANT
TOWEL GREEN STERILE (TOWEL DISPOSABLE) ×1 IMPLANT
TOWEL GREEN STERILE FF (TOWEL DISPOSABLE) ×1 IMPLANT
TRAY FOLEY MTR SLVR 16FR STAT (SET/KITS/TRAYS/PACK) IMPLANT
WATER STERILE IRR 1000ML POUR (IV SOLUTION) ×2 IMPLANT

## 2021-01-25 NOTE — Plan of Care (Signed)
  Problem: Consults Goal: RH SPINAL CORD INJURY PATIENT EDUCATION Description:  See Patient Education module for education specifics.  Outcome: Progressing Goal: Skin Care Protocol Initiated - if Braden Score 18 or less Description: If consults are not indicated, leave blank or document N/A Outcome: Progressing Goal: Diabetes Guidelines if Diabetic/Glucose > 140 Description: If diabetic or lab glucose is > 140 mg/dl - Initiate Diabetes/Hyperglycemia Guidelines & Document Interventions  Outcome: Progressing   Problem: SCI BOWEL ELIMINATION Goal: RH STG MANAGE BOWEL WITH ASSISTANCE Description: STG Manage Bowel with Supervision Assistance. Outcome: Progressing Goal: RH STG SCI MANAGE BOWEL WITH MEDICATION WITH ASSISTANCE Description: STG SCI Manage bowel with medication with Supervision assistance. Outcome: Progressing   Problem: SCI BLADDER ELIMINATION Goal: RH STG MANAGE BLADDER WITH ASSISTANCE Description: STG Manage Bladder With Supervision Assistance Outcome: Progressing   Problem: RH SKIN INTEGRITY Goal: RH STG MAINTAIN SKIN INTEGRITY WITH ASSISTANCE Description: STG Maintain Skin Integrity With Supervision Assistance. Outcome: Progressing Goal: RH STG ABLE TO PERFORM INCISION/WOUND CARE W/ASSISTANCE Description: STG Able To Perform Incision/Wound Care With Supervision Assistance. Outcome: Progressing   Problem: RH SAFETY Goal: RH STG ADHERE TO SAFETY PRECAUTIONS W/ASSISTANCE/DEVICE Description: STG Adhere to Safety Precautions With Supervision Assistance/Device. Outcome: Progressing Goal: RH STG DECREASED RISK OF FALL WITH ASSISTANCE Description: STG Decreased Risk of Fall With Cues and Reminders. Outcome: Progressing   Problem: RH PAIN MANAGEMENT Goal: RH STG PAIN MANAGED AT OR BELOW PT'S PAIN GOAL Description: < 3 on a 0-10 pain scale. Outcome: Progressing   Problem: RH KNOWLEDGE DEFICIT SCI Goal: RH STG INCREASE KNOWLEDGE OF SELF CARE AFTER SCI Description:  Patient will demonstrate knowledge of medication management, bowel/bladder management, pain management, and back precautions with educational materials and handouts provided by staff independently at discharge.  Outcome: Progressing

## 2021-01-25 NOTE — Anesthesia Procedure Notes (Signed)
Procedure Name: Intubation Date/Time: 01/25/2021 3:41 PM Performed by: Reece Agar, CRNA Pre-anesthesia Checklist: Patient identified, Emergency Drugs available, Suction available and Patient being monitored Patient Re-evaluated:Patient Re-evaluated prior to induction Oxygen Delivery Method: Circle System Utilized Preoxygenation: Pre-oxygenation with 100% oxygen Induction Type: IV induction, Rapid sequence and Cricoid Pressure applied Ventilation: Unable to mask ventilate Laryngoscope Size: Mac and 4 Grade View: Grade I Tube type: Oral Tube size: 7.5 mm Number of attempts: 1 Airway Equipment and Method: Stylet and Oral airway Placement Confirmation: ETT inserted through vocal cords under direct vision, positive ETCO2 and breath sounds checked- equal and bilateral Secured at: 23 cm Tube secured with: Tape Dental Injury: Teeth and Oropharynx as per pre-operative assessment  Comments: Pt 7hrs since full breakfast, RSI performed.

## 2021-01-25 NOTE — Transfer of Care (Signed)
Immediate Anesthesia Transfer of Care Note  Patient: [name]  Procedure(s) Performed: LUMBAR WOUND DEBRIDEMENT APPLICATION OF WOUND VAC  Patient Location: PACU  Anesthesia Type:General  Level of Consciousness: awake and alert   Airway & Oxygen Therapy: Patient Spontanous Breathing and Patient connected to face mask oxygen  Post-op Assessment: Report given to RN, Post -op Vital signs reviewed and stable and Patient moving all extremities X 4  Post vital signs: Reviewed and stable  Last Vitals:  Vitals Value Taken Time  BP 122/70 01/25/21 1703  Temp    Pulse 103 01/25/21 1710  Resp 20 01/25/21 1710  SpO2 100 % 01/25/21 1710  Vitals shown include unvalidated device data.  Last Pain:  Vitals:   01/25/21 1511  TempSrc: Oral  PainSc: 3          Complications: No notable events documented.

## 2021-01-25 NOTE — Anesthesia Preprocedure Evaluation (Deleted)
Anesthesia Evaluation    Reviewed: Allergy & Precautions, Patient's Chart, lab work & pertinent test results  Airway        Dental   Pulmonary sleep apnea and Continuous Positive Airway Pressure Ventilation ,           Cardiovascular hypertension, Pt. on medications      Neuro/Psych  Neuromuscular disease    GI/Hepatic negative GI ROS, Neg liver ROS,   Endo/Other  diabetesMorbid obesity  Renal/GU negative Renal ROS     Musculoskeletal  (+) Arthritis ,   Abdominal (+) + obese,   Peds  Hematology  (+) anemia , HLD   Anesthesia Other Findings Infected Lumbar wound  Reproductive/Obstetrics                             Anesthesia Physical Anesthesia Plan  ASA: 3  Anesthesia Plan: General   Post-op Pain Management:    Induction: Intravenous  PONV Risk Score and Plan: 2 and Ondansetron, Dexamethasone, Midazolam and Treatment may vary due to age or medical condition  Airway Management Planned: Oral ETT  Additional Equipment:   Intra-op Plan:   Post-operative Plan: Extubation in OR  Informed Consent:   Plan Discussed with:   Anesthesia Plan Comments:         Anesthesia Quick Evaluation

## 2021-01-25 NOTE — Progress Notes (Signed)
Physical Therapy Session Note  Patient Details  Name: [name] MRN: [id number] Date of Birth: 01/17/1966  Today's Date: 01/25/2021 PT Individual Time: 1000-1045 PT Individual Time Calculation (min): 45 min   Short Term Goals: Week 1:  PT Short Term Goal 1 (Week 1): Pt will perform bed mobility with CGA while adhering to back precautions. PT Short Term Goal 2 (Week 1): Pt will perform standing transfers with consistent CGA. PT Short Term Goal 3 (Week 1): Pt will ambulate at least 30 feet consistently with CGA and LRAD. PT Short Term Goal 4 (Week 1): Pt will initiate stair negotiation training. PT Short Term Goal 5 (Week 1): Pt will correctly don lumbar corset with supervision and min cues.  Skilled Therapeutic Interventions/Progress Updates: Pt presented in bed agreeable to therapy with extensive encouragement. Pt states pain 9/10 however unable to receive pain meds due to scheduled surgery later today. Pt agreeable to supine therex with PTA providing education on benefits of increasing mobility as best as can and incorporating core stabilizing techniques with all movements. Pt performed ankle pumps, heel slides, SLR, hip abd/add and SAQ 2 x 10 bilaterally. Pt also participated in neural glides (SLR with DF) x 10. Pt refusing any OOB activity due to pain. Pt left in bed with call bell within reach and needs met.      Therapy Documentation Precautions:  Precautions Precautions: Fall, Back Precaution Booklet Issued: No Precaution Comments: pt able to recall 3/3 back precautions with ind when prompted Required Braces or Orthoses: Spinal Brace Spinal Brace: Lumbar corset, Applied in sitting position Other Brace: for comfort, donned seated Restrictions Weight Bearing Restrictions: No General:   Vital Signs: Therapy Vitals Temp: 99.1 F (37.3 C) Pulse Rate: (!) 102 Resp: 20 BP: 127/74 Patient Position (if appropriate): Lying Oxygen Therapy SpO2: 96 % O2 Device: Room  Air Pain:   Mobility:   Locomotion :    Trunk/Postural Assessment :    Balance:   Exercises:   Other Treatments:      Therapy/Group: Individual Therapy  Rosita DeChalus 01/25/2021, 4:17 PM  

## 2021-01-25 NOTE — Progress Notes (Signed)
Inpatient Rehabilitation Care Coordinator Discharge Note   Patient Details  Name: [name] MRN: [id number] Date of Birth: [birth year]   Discharge location: D/c to acute hospital due to planned surgery  Length of Stay: 7 days  Discharge activity level: Min A  Home/community participation:    Patient response VW:PVXYIA Literacy - How often do you need to have someone help you when you read instructions, pamphlets, or other written material from your doctor or pharmacy?: Never  Patient response XK:PVVZSM Isolation - How often do you feel lonely or isolated from those around you?: Never  Services provided included: MD, RD, PT, OT, RN, TR, Pharmacy, Neuropsych, SW, CM  Financial Services:  Charity fundraiser Utilized: Private Insurance [employer]  Choices offered to/list presented to: N/A  Follow-up services arranged:     Patient response to transportation need: Is the patient able to respond to transportation needs?: No In the past 12 months, has lack of transportation kept you from medical appointments or from getting medications?: No In the past 12 months, has lack of transportation kept you from meetings, work, or from getting things needed for daily living?: No   Comments (or additional information):  Patient/Family verbalized understanding of follow-up arrangements:  Yes  Individual responsible for coordination of the follow-up plan:    Confirmed correct DME delivered: [name] 01/25/2021    [name]

## 2021-01-25 NOTE — Progress Notes (Signed)
Lab informed nurse that patient nasal swab has returned with positive reading for MRSA. PA made aware. Sanda Linger, LPN

## 2021-01-25 NOTE — Progress Notes (Signed)
PROGRESS NOTE   Subjective/Complaints:  Pt going to OR today for total washout- per Dr Reatha Armour, either placing a wound Vac to heal by secondary intention OR to go BACK to OR Monday/Tuesday to close.    Will try to arrange pt to go back to acute after surgery today after 5pm- however still waiting to speak with Dr Sherlyn Lick to be clear about plan.   Pt still having a lot of sanguinous drainage- also found to have MRSA on swab.   Still having pain in R low back- not midline as much.    ROS:   Pt denies SOB, abd pain, CP, N/V/C/D, and vision changes   Objective:   CT LUMBAR SPINE W CONTRAST  Result Date: 01/24/2021 CLINICAL DATA:  Low back pain, infection suspected EXAM: CT LUMBAR SPINE WITH CONTRAST TECHNIQUE: Multidetector CT imaging of the lumbar spine was performed with intravenous contrast administration. CONTRAST:  139mL OMNIPAQUE IOHEXOL 350 MG/ML SOLN COMPARISON:  01/11/2021 CT lumbar spine, 01/22/2021 CT abdomen pelvis FINDINGS: Segmentation: 5 lumbar type vertebrae. Alignment: Mild dextrocurvature of the lumbar spine. Vertebrae: Status post posterior fixation L1-S1, with interbody disc spacers and posterior decompression, which appear unchanged compared to 01/22/2021. Evaluation is somewhat limited by beam hardening artifact from hardware, but no acute fractures seen. Fusion of the left sacroiliac joint Paraspinal and other soft tissues: Fluid and small amount of air in the soft tissues and musculature posterior to the fixation and decompression site, grossly unchanged compared to 01/22/2021. The soft tissue component of fluid collection extends from the level of T10 to S1, and measures up to 2.2 x 4.1 cm (series 6, image 97), just to the right of midline; the portion that extends into the operative bed is difficult to measure given beam hardening artifact from hardware, but the collection likely extends from L1 through S1 and  extends to the posterior aspect of the thecal sac (series 6, image 93). High density material and air is noted in the fluid collection. No significant enhancement. No prevertebral collection. Disc levels: Evaluation is limited by beam hardening artifact from hardware and postoperative fluid collection. No osseous spinal canal stenosis, status post extensive posterior decompression. IMPRESSION: Fluid collection with a small amount of air and high density material in the soft tissues and musculature posterior to the L1-S1 posterior fixation and decompression site, most likely a postoperative seroma. The soft tissue component of the collection extends from T10-S1. The intramuscular and operative bed portion of the collection is difficult to visualize secondary to beam hardening artifact from adjacent hardware; however it appears to extend to the posterior aspect of the thecal sac. No significant enhancement to suggest abscess. No prevertebral collection. Electronically Signed   By: Merilyn Baba M.D.   On: 01/24/2021 14:16   ECHOCARDIOGRAM COMPLETE  Result Date: 01/24/2021    ECHOCARDIOGRAM REPORT   Patient Name:   [name] Date of Exam: 01/23/2021 Medical Rec #:  [id number]    Height:       74.0 in Accession #:    [id number]   Weight:       324.7 lb Date of Birth:  30-Apr-1965    BSA:  2.671 m Patient Age:    55 years     BP:           118/78 mmHg Patient Gender: M            HR:           110 bpm. Exam Location:  Inpatient Procedure: 2D Echo, Cardiac Doppler and Color Doppler Indications:    Bacteremia  History:        Patient has no prior history of Echocardiogram examinations.                 Morbid Obesity. Bacteremia post back surgery.  Sonographer:    Merrie Roof RDCS Referring Phys: [id number] [place]  1. Left ventricular ejection fraction, by estimation, is 60 to 65%. The left ventricle has normal function. The left ventricle has no regional wall motion abnormalities. Left  ventricular diastolic parameters were normal.  2. Right ventricular systolic function is normal. The right ventricular size is normal.  3. The mitral valve is normal in structure. No evidence of mitral valve regurgitation. No evidence of mitral stenosis.  4. The aortic valve is tricuspid. Aortic valve regurgitation is not visualized. No aortic stenosis is present.  5. The inferior vena cava is normal in size with greater than 50% respiratory variability, suggesting right atrial pressure of 3 mmHg. FINDINGS  Left Ventricle: Left ventricular ejection fraction, by estimation, is 60 to 65%. The left ventricle has normal function. The left ventricle has no regional wall motion abnormalities. The left ventricular internal cavity size was normal in size. There is  no left ventricular hypertrophy. Left ventricular diastolic parameters were normal. Right Ventricle: The right ventricular size is normal. Right ventricular systolic function is normal. Left Atrium: Left atrial size was normal in size. Right Atrium: Right atrial size was normal in size. Pericardium: There is no evidence of pericardial effusion. Mitral Valve: The mitral valve is normal in structure. No evidence of mitral valve regurgitation. No evidence of mitral valve stenosis. Tricuspid Valve: The tricuspid valve is normal in structure. Tricuspid valve regurgitation is trivial. No evidence of tricuspid stenosis. Aortic Valve: The aortic valve is tricuspid. Aortic valve regurgitation is not visualized. No aortic stenosis is present. Aortic valve mean gradient measures 5.0 mmHg. Aortic valve peak gradient measures 9.6 mmHg. Pulmonic Valve: The pulmonic valve was grossly normal. Pulmonic valve regurgitation is not visualized. No evidence of pulmonic stenosis. Aorta: The aortic root is normal in size and structure. Venous: The inferior vena cava is normal in size with greater than 50% respiratory variability, suggesting right atrial pressure of 3 mmHg. IAS/Shunts:  No atrial level shunt detected by color flow Doppler.  LEFT VENTRICLE PLAX 2D LVIDd:         4.50 cm   Diastology LVIDs:         2.90 cm   LV e' medial:    11.40 cm/s LV PW:         1.30 cm   LV E/e' medial:  8.3 LV IVS:        1.10 cm   LV e' lateral:   12.60 cm/s LVOT diam:     2.20 cm   LV E/e' lateral: 7.5 LV SV:         51 LV SV Index:   19 LVOT Area:     3.80 cm  RIGHT VENTRICLE RV Basal diam:  3.50 cm LEFT ATRIUM           Index  RIGHT ATRIUM           Index LA diam:      3.80 cm 1.42 cm/m   RA Area:     18.40 cm LA Vol (A4C): 61.4 ml 22.99 ml/m  RA Volume:   49.20 ml  18.42 ml/m  AORTIC VALVE AV Area (Vmax):    2.50 cm AV Area (Vmean):   2.41 cm AV Area (VTI):     2.32 cm AV Vmax:           155.00 cm/s AV Vmean:          104.000 cm/s AV VTI:            0.221 m AV Peak Grad:      9.6 mmHg AV Mean Grad:      5.0 mmHg LVOT Vmax:         102.00 cm/s LVOT Vmean:        66.000 cm/s LVOT VTI:          0.135 m LVOT/AV VTI ratio: 0.61  AORTA Ao Root diam: 3.40 cm Ao Asc diam:  3.30 cm MITRAL VALVE MV Area (PHT): 4.49 cm    SHUNTS MV Decel Time: 169 msec    Systemic VTI:  0.14 m MV E velocity: 94.80 cm/s  Systemic Diam: 2.20 cm MV A velocity: 90.60 cm/s MV E/A ratio:  1.05 Kirk Ruths MD Electronically signed by Kirk Ruths MD Signature Date/Time: 01/24/2021/3:04:14 PM    Final    Recent Labs    01/23/21 0513 01/24/21 0503  WBC 5.7 4.0  HGB 8.1* 8.4*  HCT 23.7* 24.4*  PLT 200 206   Recent Labs    01/23/21 0513 01/24/21 0503  NA 130* 133*  K 3.4* 3.7  CL 95* 99  CO2 25 24  GLUCOSE 120* 118*  BUN 16 15  CREATININE 0.96 0.92  CALCIUM 7.9* 8.1*    Intake/Output Summary (Last 24 hours) at 01/25/2021 1318 Last data filed at 01/25/2021 0900 Gross per 24 hour  Intake 240 ml  Output 1300 ml  Net -1060 ml        Physical Exam: Vital Signs Blood pressure 133/72, pulse (!) 103, temperature 98.3 F (36.8 C), temperature source Oral, resp. rate 18, height 6\' 2"  (1.88 m),  weight (!) 147.3 kg, SpO2 97 %.       General: awake, alert, appropriate, laying in bed; just woke up; NAD HENT: conjugate gaze; oropharynx moist CV: regular rhythm; slightly tachycardic rate; no JVD Pulmonary: CTA B/L; no W/R/R- good air movement GI: soft, NT, ND, (+)BS Psychiatric: appropriate Neurological: alert Ext: no clubbing, cyanosis, or edema Psych: pleasant and cooperative  Skin: incision boggy and puffy still- a little red, but hard to tell since laying on it- saturated with sanguinous drainage and chuck also saturated with drainage- looks about the same Neuro:  Alert and oriented x 3. Normal insight and awareness. Intact Memory. Normal language and speech. Cranial nerve exam unremarkable. UE motor 5/5. RLE limited by pain but 4 to 4+, LLE 4+ to 5. No focal sensory findings. DTR's 1+ Musculoskeletal: right hip adductors tender with palpation - tight on palpation.  Chronic OA changes right knee.Also TTP R low back.    Assessment/Plan: 1. Functional deficits which require 3+ hours per day of interdisciplinary therapy in a comprehensive inpatient rehab setting. Physiatrist is providing close team supervision and 24 hour management of active medical problems listed below. Physiatrist and rehab team continue to assess barriers to discharge/monitor patient  progress toward functional and medical goals  Care Tool:  Bathing    Body parts bathed by patient: Right arm, Left arm, Chest, Abdomen, Front perineal area, Right upper leg, Left upper leg   Body parts bathed by helper: Buttocks, Right lower leg, Left lower leg     Bathing assist Assist Level: Moderate Assistance - Patient 50 - 74%     Upper Body Dressing/Undressing Upper body dressing   What is the patient wearing?: Pull over shirt, Orthosis    Upper body assist Assist Level: Moderate Assistance - Patient 50 - 74%    Lower Body Dressing/Undressing Lower body dressing      What is the patient wearing?: Pants      Lower body assist Assist for lower body dressing: Total Assistance - Patient < 25%     Toileting Toileting    Toileting assist Assist for toileting: Moderate Assistance - Patient 50 - 74%     Transfers Chair/bed transfer  Transfers assist     Chair/bed transfer assist level: Moderate Assistance - Patient 50 - 74%     Locomotion Ambulation   Ambulation assist      Assist level: Minimal Assistance - Patient > 75% Assistive device: Walker-rolling Max distance: 12   Walk 10 feet activity   Assist  Walk 10 feet activity did not occur: Safety/medical concerns  Assist level: Minimal Assistance - Patient > 75% Assistive device: Walker-rolling   Walk 50 feet activity   Assist Walk 50 feet with 2 turns activity did not occur: N/A  Assist level: Minimal Assistance - Patient > 75% Assistive device: Walker-rolling    Walk 150 feet activity   Assist Walk 150 feet activity did not occur: N/A         Walk 10 feet on uneven surface  activity   Assist Walk 10 feet on uneven surfaces activity did not occur: N/A         Wheelchair     Assist Is the patient using a wheelchair?: No Type of Wheelchair: Manual    Wheelchair assist level: Total Assistance - Patient < 25% Max wheelchair distance: 10 ft    Wheelchair 50 feet with 2 turns activity    Assist        Assist Level: Total Assistance - Patient < 25%   Wheelchair 150 feet activity     Assist      Assist Level: Total Assistance - Patient < 25%   Blood pressure 133/72, pulse (!) 103, temperature 98.3 F (36.8 C), temperature source Oral, resp. rate 18, height 6\' 2"  (1.88 m), weight (!) 147.3 kg, SpO2 97 %. Medical Problem List and Plan: 1.  Impaired mobility s/p TLIF for lumbar radiculoapthy             -patient may shower but incision must be covered             -ELOS/Goals: 14 days              -Continue CIR therapies including PT, OT   D/c 10/24 Con't PT and OT, but going  to surgery around 5pm- make NPO after breakfast since going to OR after 5pm. Need to discuss with Dr Mellody Drown- did speak with Dr Reatha Armour- also spoke with ID- they agree with OR asap.  2.  Antithrombotics: -DVT/anticoagulation:  Pharmaceutical: Lovenox             -antiplatelet therapy: N/a 3. Pain Management: Continue oxycodone prn. .             --  scheduled Zanaflex increased to 4 mg TID             --Continue Gabapentin 600 mg TID (PTA)  -continue kpad for right thigh/leg  -ms contin 15mg  q12 started Friday  10/11- pain- still tight- esp R inner thigh adductors- and R low back- will add Robaxin 750 mg TID Prn.   10/12- will give 1 xValium dose and con't MS Contin- increase Oxy to 10-15 mg  10/13- needs CT with contrast today- will give another dose of Valium 5 mg as well as oxycodone prior to going. 10/14- pain still an issue- con't regimen  4. Mood: LCSW to follow for evaluation and support.              -antipsychotic agents: N/A 5. Neuropsych: This patient is capable of making decisions on his own behalf. 6. Spinal incision: Monitor wound for healing. Routine pressure relief measures.              -started MVI as well as juven to help promote wound healing.              -given bogginess and drainage, continue doxycycline -primary team requested NSGY to follow along.  10/10- NSU called and following-   10/14- OR today 7. Fluids/Electrolytes/Nutrition: Monitor I/O--encourage fluid intake. 8. HTN; Did have issues with significant hypotension post op--continue to hold BP meds for now. -- orthostatic vitals were ordered.  9. T2DM: Hgb A1C-9.0. Add CM restrictions. Renal status stable--continue to encourage fluid intake.               ---Monitor BS ac/hs with SSI for elevated BS.              --discontinued levemir and resume metformin.             CBG (last 3)  Recent Labs    01/24/21 2129 01/25/21 0634 01/25/21 1131  GLUCAP 91 111* 104*   10/9 cbg's under better control today. May  need second med or resumption of insulin. Observe for pattern  10/10- was more elevated today- added Glipizide 5 mg BID-AC- and will monitor- better BG control is good for wound healing.   10/13- BG's controlled- con't regimen  10/14- held Glipzide and Metformin since is NPO.  10. Acute blood loss anemia: Monitor for signs of bleeding. Recheck CBC on 10/10 11. Acute on chronic CKD?: Will recheck CMET on 10/10.  12. OSA: Continue CPAP at nights.  13.  Elevated CK: Question rhabdo due to statin. Continue to hold Crestor and recheck CK on 01/21/21.  10/10- Cr 1.18- doing better- con't to monitor  10/13- Cr/Bun elevated yesterday- gave IVFs- is better so will stop today.  14.  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   15. Infection. Klebsiella pneumonia bacteremia  10/10- procalcitonin 137! Will start of Cefipime and Vanc IV and cansult IM to help. Labs in AM  10/11- Procalcitonin- reduced to 124- will monitor and con't IV ABX- IM didn't feel anything else to do.   10/12- procalcitonin down to 80- on Cefipime IV- ID consulted yesterday and suggested CT of abd/pelvis- which shows no abscess, etc- just enlarged spleen- per wife, never had before that they were aware of- will ask Surgery to see if spleen size could be causing some of abd pain?  10/13- ID wants CT of lumbar spine with contrast- will order and get done- see what needs to be done- spoke with ID and PA about this.   10/14- going  to OR today- also is MRSA (+) on swab 16. Hypomagnesemia  10/10- will give MgOx 400 mg BID and recheck this week  10/11- DO IV Mg to help and recheck.   10/12- Mg up to 2.0- con't regimen 17. Constipation  10/10- LBM 3 days ago- could be cause of N/V this am x3- will give sorbitol x1 after therapy today.   10/11- no BM- will give enema to get cleaned out.   10/12- had BM yesterday AM- con't regimen 18. Hypokalemia  10/12- will replete KCL with 40 mEq x1 19. Azotemia  10/12- finish IVFs since Bun  down to 16 and doing better  I spent a total of 39 minutes on total care >50% coordination of care- discussing pt care with pt, NSU, PA and Wife.   LOS: 7 days A FACE TO FACE EVALUATION WAS PERFORMED  Geneva Pallas 01/25/2021, 1:18 PM

## 2021-01-25 NOTE — Progress Notes (Signed)
Occupational Therapy Session Note  Patient Details  Name: [name] MRN: [id number] Date of Birth: November 04, 1965  Today's Date: 01/25/2021 OT Individual Time: 1884-1660 OT Individual Time Calculation (min): 59 min   Short Term Goals: Week 1:  OT Short Term Goal 1 (Week 1): Pt will complete grooming task in standing with CGA. OT Short Term Goal 2 (Week 1): Pt will complete ambulatory toilet transfer with min A + LRAD. OT Short Term Goal 3 (Week 1): Pt will don pants with mod A + AE PRN.  Skilled Therapeutic Interventions/Progress Updates:  Session 1: Patient met lying supine in bed in agreement with OT treatment session. 8/10 pain reported at rest and with activity in low back. RN present at start of session to administer pain meds. Patient scheduled for L1-L2 I&D with wound vac placement later today. Patient initially declining therapy reporting NPO status after breakfast with concern for ability to take additional pain meds as the day progresses. RN assure patient that he could have pain meds and small sips of water as needed. Patient then in agreement with OT treatment session. Supine to EOB with HOB elevated and assist to bring trunk upright. Sit to sand from elevated EOB with Min guard. Good recall for hand placement/safety. Functional mobility to commode in bathroom and transfer to bariatric BSC over toilet with Min guard reporting need for BM (unsuccessful in passing stool). Seated at sink level patient completed 3/3 grooming tasks with set-up. Total A for wc transport to ortho gym. Patient tolerated 1, 10 min bout on BUE arm ergometer on level 2. RPM 30-40 without need for rest break. Patient declined sitting in recliner with request to return to bed. Min A at BLE. Session concluded with patient lying supine with call bell within reach, bed alarm activated and all needs met.   Session 2: Patient met lying supine in bed. Reports receiving pain meds prior to this writers entry. Patient reports  recently being able to get comfortable in bed. Declines ADLs, OOB, EOB or bed level activity this afternoon. Desire to forgo afternoon treatment session. Patient missed 45 minutes of OT treatment session.   Therapy Documentation Precautions:  Precautions Precautions: Fall, Back Precaution Booklet Issued: No Precaution Comments: pt able to recall 3/3 back precautions with ind when prompted Required Braces or Orthoses: Spinal Brace Spinal Brace: Lumbar corset, Applied in sitting position Other Brace: for comfort, donned seated Restrictions Weight Bearing Restrictions: No General: General OT Amount of Missed Time: 45 Minutes  Therapy/Group: Individual Therapy  [name] R Howerton-Davis 01/25/2021, 6:57 AM

## 2021-01-25 NOTE — Progress Notes (Signed)
Physical Therapy Session Note  Patient Details  Name: [name] MRN: [id number] Date of Birth: Dec 25, 1965  Today's Date: 01/25/2021 PT Individual Time: 0240-9735 PT Individual Time Calculation (min): 42 min   Short Term Goals: Week 1:  PT Short Term Goal 1 (Week 1): Pt will perform bed mobility with CGA while adhering to back precautions. PT Short Term Goal 2 (Week 1): Pt will perform standing transfers with consistent CGA. PT Short Term Goal 3 (Week 1): Pt will ambulate at least 30 feet consistently with CGA and LRAD. PT Short Term Goal 4 (Week 1): Pt will initiate stair negotiation training. PT Short Term Goal 5 (Week 1): Pt will correctly don lumbar corset with supervision and min cues.  Skilled Therapeutic Interventions/Progress Updates:  Pt received supine in bed, reported 10/10 pain in low back and required extensive encouragement to perform bed-level exercises. Emphasis of session on core bracing and diaphragmatic breathing for pain modulation. Educated pt on importance of core activation for back pain relief and improved mobility. Demonstrated posterior pelvic tilts and pt performed 25-30 tilts, tactile cues for proper form. Pt reported decrease in pain to 8/10, encouraged pt to continue movement throughout day to reduce muscular tightness and modulate pain. Educated pt on diaphragmatic breathing to assist core stability, pt practiced 5 minutes of breathing and progressed to breathing during pelvic tilts. Pt was left supine in bed, heat pack on low back, all needs in reach.   Therapy Documentation Precautions:  Precautions Precautions: Fall, Back Precaution Booklet Issued: No Precaution Comments: pt able to recall 3/3 back precautions with ind when prompted Required Braces or Orthoses: Spinal Brace Spinal Brace: Lumbar corset, Applied in sitting position Other Brace: for comfort, donned seated Restrictions Weight Bearing Restrictions: No   Therapy/Group: Individual  Therapy Cruzita Lederer Kali Deadwyler, PT, DPT  01/25/2021, 7:56 AM

## 2021-01-25 NOTE — Discharge Summary (Signed)
Physician Discharge Summary  Patient ID: ESAI STECKLEIN MRN: [id number] DOB/AGE: [birth year] 57 y.o.  Admit date: 01/18/2021 Discharge date: 01/25/2021  Discharge Diagnoses:  Principal Problem:   Lumbar radiculopathy Active Problems:   DM II (diabetes mellitus, type II), controlled ([place])   Essential hypertension   Unilateral primary osteoarthritis, right knee   Wound infection after surgery   Muscle spasms of lower extremity   Abdominal pain   Spleen enlarged   Discharged Condition: stable  Significant Diagnostic Studies: DG Abd 1 View  Result Date: 01/18/2021 CLINICAL DATA:  Constipated EXAM: ABDOMEN - 1 VIEW COMPARISON:  None. FINDINGS: The bowel gas pattern is normal. No radio-opaque calculi or other significant radiographic abnormality are seen. Lumbar surgical hardware. IMPRESSION: Nonobstructive bowel gas pattern Electronically Signed   By: Iven Finn M.D.   On: 01/18/2021 15:32    CT ABDOMEN PELVIS W CONTRAST  Result Date: 01/22/2021 CLINICAL DATA:  Recent back surgery, abdominal pain EXAM: CT ABDOMEN AND PELVIS WITH CONTRAST TECHNIQUE: Multidetector CT imaging of the abdomen and pelvis was performed using the standard protocol following bolus administration of intravenous contrast. CONTRAST:  69mL OMNIPAQUE IOHEXOL 300 MG/ML  SOLN COMPARISON:  01/21/2021 FINDINGS: Lower chest: No acute pleural or parenchymal lung disease. Hepatobiliary: No focal liver abnormality is seen. No gallstones, gallbladder wall thickening, or biliary dilatation. Pancreas: Unremarkable. No pancreatic ductal dilatation or surrounding inflammatory changes. Spleen: Spleen is enlarged, measuring 18.8 x 17.4 x 10.1 cm. No focal parenchymal abnormality. Adrenals/Urinary Tract: Adrenal glands are unremarkable. Kidneys are normal, without renal calculi, focal lesion, or hydronephrosis. Bladder decompressed by Foley catheter. Stomach/Bowel: No bowel obstruction or ileus. Normal appendix right lower quadrant. No  bowel wall thickening or inflammatory change. Vascular/Lymphatic: No significant vascular findings are present. No enlarged abdominal or pelvic lymph nodes. Reproductive: Prostate is unremarkable. Other: No free intraperitoneal fluid or free gas. No abdominal wall hernia. Musculoskeletal: Postsurgical changes are seen from discectomies at L1-2, L2-3, L3-4, L4-5, and L5-S1. Laminectomy and posterior fusion identified spanning L1 through S1. No evidence of metallic hardware loosening or failure. Postsurgical changes are seen within the midline lower back. Evaluation of the central canal is limited due to streak artifact. Minimal fluid within the subcutaneous tissues likely postoperative seroma. No rim enhancing fluid collection to suggest abscess. Minimal gas adjacent to the T12 spinous process likely postsurgical. No acute displaced fractures. Reconstructed images demonstrate no additional findings. IMPRESSION: 1. Extensive postsurgical changes throughout the lumbar spine as above. 2. Splenomegaly. 3. Otherwise no acute intra-abdominal or intrapelvic process. Electronically Signed   By: Randa Ngo M.D.   On: 01/22/2021 20:42   DG CHEST PORT 1 VIEW  Result Date: 01/21/2021 CLINICAL DATA:  Fever EXAM: PORTABLE CHEST 1 VIEW COMPARISON:  CT chest 07/18/2013 FINDINGS: Prominent cardiac silhouette due to AP portable technique. The heart and mediastinal contours are within normal limits. No focal consolidation. No pulmonary edema. No pleural effusion. No pneumothorax. No acute osseous abnormality. IMPRESSION: No active disease. Electronically Signed   By: Iven Finn M.D.   On: 01/21/2021 19:42   DG Abd Portable 1V  Result Date: 01/21/2021 CLINICAL DATA:  fever EXAM: PORTABLE ABDOMEN - 1 VIEW COMPARISON:  x-ray abdomen 01/18/2021 FINDINGS: Markedly limited evaluation due to overlapping osseous structures and overlying soft tissues. Lumbosacral surgical hardware again noted. The bowel gas pattern is normal.  No radio-opaque calculi or other significant radiographic abnormality are seen. IMPRESSION: Nonobstructive bowel gas pattern. Electronically Signed   By: Iven Finn M.D.   On:  01/21/2021 19:43   DG C-Arm 1-60 Min-No Report   ECHOCARDIOGRAM COMPLETE  Result Date: 01/24/2021    ECHOCARDIOGRAM REPORT   Patient Name:   AARAV BURGETT Date of Exam: 01/23/2021 Medical Rec #:  [id number]    Height:       74.0 in Accession #:    [id number]   Weight:       324.7 lb Date of Birth:  June 10, 1965    BSA:          2.671 m Patient Age:    15 years     BP:           118/78 mmHg Patient Gender: M            HR:           110 bpm. Exam Location:  Inpatient Procedure: 2D Echo, Cardiac Doppler and Color Doppler Indications:    Bacteremia  History:        Patient has no prior history of Echocardiogram examinations.                 Morbid Obesity. Bacteremia post back surgery.  Sonographer:    Merrie Roof RDCS Referring Phys: [id number] [place]  1. Left ventricular ejection fraction, by estimation, is 60 to 65%. The left ventricle has normal function. The left ventricle has no regional wall motion abnormalities. Left ventricular diastolic parameters were normal.  2. Right ventricular systolic function is normal. The right ventricular size is normal.  3. The mitral valve is normal in structure. No evidence of mitral valve regurgitation. No evidence of mitral stenosis.  4. The aortic valve is tricuspid. Aortic valve regurgitation is not visualized. No aortic stenosis is present.  5. The inferior vena cava is normal in size with greater than 50% respiratory variability, suggesting right atrial pressure of 3 mmHg. FINDINGS  Left Ventricle: Left ventricular ejection fraction, by estimation, is 60 to 65%. The left ventricle has normal function. The left ventricle has no regional wall motion abnormalities. The left ventricular internal cavity size was normal in size. There is  no left ventricular hypertrophy. Left  ventricular diastolic parameters were normal. Right Ventricle: The right ventricular size is normal. Right ventricular systolic function is normal. Left Atrium: Left atrial size was normal in size. Right Atrium: Right atrial size was normal in size. Pericardium: There is no evidence of pericardial effusion. Mitral Valve: The mitral valve is normal in structure. No evidence of mitral valve regurgitation. No evidence of mitral valve stenosis. Tricuspid Valve: The tricuspid valve is normal in structure. Tricuspid valve regurgitation is trivial. No evidence of tricuspid stenosis. Aortic Valve: The aortic valve is tricuspid. Aortic valve regurgitation is not visualized. No aortic stenosis is present. Aortic valve mean gradient measures 5.0 mmHg. Aortic valve peak gradient measures 9.6 mmHg. Pulmonic Valve: The pulmonic valve was grossly normal. Pulmonic valve regurgitation is not visualized. No evidence of pulmonic stenosis. Aorta: The aortic root is normal in size and structure. Venous: The inferior vena cava is normal in size with greater than 50% respiratory variability, suggesting right atrial pressure of 3 mmHg. IAS/Shunts: No atrial level shunt detected by color flow Doppler.  LEFT VENTRICLE PLAX 2D LVIDd:         4.50 cm   Diastology LVIDs:         2.90 cm   LV e' medial:    11.40 cm/s LV PW:         1.30 cm   LV E/e'  medial:  8.3 LV IVS:        1.10 cm   LV e' lateral:   12.60 cm/s LVOT diam:     2.20 cm   LV E/e' lateral: 7.5 LV SV:         51 LV SV Index:   19 LVOT Area:     3.80 cm  RIGHT VENTRICLE RV Basal diam:  3.50 cm LEFT ATRIUM           Index        RIGHT ATRIUM           Index LA diam:      3.80 cm 1.42 cm/m   RA Area:     18.40 cm LA Vol (A4C): 61.4 ml 22.99 ml/m  RA Volume:   49.20 ml  18.42 ml/m  AORTIC VALVE AV Area (Vmax):    2.50 cm AV Area (Vmean):   2.41 cm AV Area (VTI):     2.32 cm AV Vmax:           155.00 cm/s AV Vmean:          104.000 cm/s AV VTI:            0.221 m AV Peak Grad:       9.6 mmHg AV Mean Grad:      5.0 mmHg LVOT Vmax:         102.00 cm/s LVOT Vmean:        66.000 cm/s LVOT VTI:          0.135 m LVOT/AV VTI ratio: 0.61  AORTA Ao Root diam: 3.40 cm Ao Asc diam:  3.30 cm MITRAL VALVE MV Area (PHT): 4.49 cm    SHUNTS MV Decel Time: 169 msec    Systemic VTI:  0.14 m MV E velocity: 94.80 cm/s  Systemic Diam: 2.20 cm MV A velocity: 90.60 cm/s MV E/A ratio:  1.05 Kirk Ruths MD Electronically signed by Kirk Ruths MD Signature Date/Time: 01/24/2021/3:04:14 PM    Final    VAS [name] LOWER EXTREMITY VENOUS (DVT)  Result Date: 01/23/2021  Lower Venous DVT Study Patient Name:  SPENCER CARDINAL  Date of Exam:   01/22/2021 Medical Rec #: [id number]     Accession #:    [id number] Date of Birth: Feb 12, 1966     Patient Gender: M Patient Age:   37 years Exam [facility]:  [facility] Procedure:      VAS [name] LOWER EXTREMITY VENOUS (DVT) Referring Phys: Ronnell Clinger --------------------------------------------------------------------------------  Indications: Fever.  Comparison Study: no prior Performing Technologist: Archie Patten RVS  Examination Guidelines: A complete evaluation includes B-mode imaging, spectral Doppler, color Doppler, and power Doppler as needed of all accessible portions of each vessel. Bilateral testing is considered an integral part of a complete examination. Limited examinations for reoccurring indications may be performed as noted. The reflux portion of the exam is performed with the patient in reverse Trendelenburg.  +---------+---------------+---------+-----------+----------+--------------+ RIGHT    CompressibilityPhasicitySpontaneityPropertiesThrombus Aging +---------+---------------+---------+-----------+----------+--------------+ CFV      Full           Yes      Yes                                 +---------+---------------+---------+-----------+----------+--------------+ SFJ      Full                                                         +---------+---------------+---------+-----------+----------+--------------+  FV Prox  Full                                                        +---------+---------------+---------+-----------+----------+--------------+ FV Mid   Full                                                        +---------+---------------+---------+-----------+----------+--------------+ FV DistalFull                                                        +---------+---------------+---------+-----------+----------+--------------+ PFV      Full                                                        +---------+---------------+---------+-----------+----------+--------------+ POP      Full           Yes      Yes                                 +---------+---------------+---------+-----------+----------+--------------+ PTV      Full                                                        +---------+---------------+---------+-----------+----------+--------------+ PERO     Full                                                        +---------+---------------+---------+-----------+----------+--------------+   +---------+---------------+---------+-----------+----------+--------------+ LEFT     CompressibilityPhasicitySpontaneityPropertiesThrombus Aging +---------+---------------+---------+-----------+----------+--------------+ CFV      Full           Yes      Yes                                 +---------+---------------+---------+-----------+----------+--------------+ SFJ      Full                                                        +---------+---------------+---------+-----------+----------+--------------+ FV Prox  Full                                                        +---------+---------------+---------+-----------+----------+--------------+  FV Mid   Full                                                         +---------+---------------+---------+-----------+----------+--------------+ FV DistalFull                                                        +---------+---------------+---------+-----------+----------+--------------+ PFV      Full                                                        +---------+---------------+---------+-----------+----------+--------------+ POP      Full           Yes      Yes                                 +---------+---------------+---------+-----------+----------+--------------+ PTV      Full                                                        +---------+---------------+---------+-----------+----------+--------------+ PERO     Full                                                        +---------+---------------+---------+-----------+----------+--------------+     Summary: BILATERAL: - No evidence of deep vein thrombosis seen in the lower extremities, bilaterally. -No evidence of popliteal cyst, bilaterally.   *See table(s) above for measurements and observations. Electronically signed by Harold Barban MD on 01/23/2021 at 9:24:49 PM.    Final     Labs:  Basic Metabolic Panel: Recent Labs  Lab 01/21/21 0720 01/22/21 0510 01/23/21 0513 01/24/21 0503  NA 132* 128* 130* 133*  K 3.8 3.5 3.4* 3.7  CL 95* 96* 95* 99  CO2 22 25 25 24   GLUCOSE 243* 174* 120* 118*  BUN 16 21* 16 15  CREATININE 1.18 1.05 0.96 0.92  CALCIUM 8.7* 7.8* 7.9* 8.1*  MG 1.4*  --  2.0  --     CBC: Recent Labs  Lab 01/22/21 0510 01/23/21 0513 01/24/21 0503  WBC 9.4 5.7 4.0  NEUTROABS 8.3* 4.5 3.0  HGB 8.4* 8.1* 8.4*  HCT 24.5* 23.7* 24.4*  MCV 88.1 89.1 87.8  PLT 205 200 206    CBG: Recent Labs  Lab 01/24/21 1113 01/24/21 1639 01/24/21 2129 01/25/21 0634 01/25/21 1131  GLUCAP 145* 151* 91 111* 104*    Brief HPI:   DREVION OFFORD is a 55 y.o. male with history of DVT, T2DM, OSA, obesity, lumbar gnosis with RLE radiculopathy pain limiting  functional 6 months.  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 Venetia Constable.  Postop noted to have elevated CK at 2 2972 with lactic acidosis, low mag levels, intermittent tachycardia with heart rates up to 120s as well as acute blood loss anemia and hyponatremia.  Hyponatremia felt to be dilutional from fluid boluses which are required for episodic hypotension.  CK was trending downward to 1970 and hyponatremia resolved improving with sodium up to 131.  He had issues with urinary retention requiring Foley placement.  Has also had constipation with reports of no BM since surgery, poor appetite, bleeding from incision, neuropathy and spasticity as well as orthostatic symptoms affect him mobility and ADLs.  CIR was recommended due to functional decline.   Hospital Course: TUKKER BYRNS was admitted to rehab 01/18/2021 for inpatient therapies to consist of PT and OT at least three hours five days a week. Past admission physiatrist, therapy team and rehab RN have worked together to provide customized collaborative inpatient rehab. At admission, he required  mod assist with basic ADL tasks and min assist with mobility. He was started on multivitamin as well as protein supplements to help promote wound healing.  He was noted to have bogginess as well as drainage from superior aspect of wound and therefore added on doxycycline for wound prophylaxis.  Was added to help with DJD right knee.  KUB was done at admission due to reports of abdominal pain as well as nausea and poor appetite.  This was negative for ileus therefore laxatives added to help resolve constipation.  Levemir was discontinued and metformin was resumed to help with blood sugar control.  Glucotrol was added additionally as blood sugars were noted to be poorly controlled.  Ensure supplements were added due to variable p.o. intake.  Follow-up CBC done  showed acute blood loss anemia to be stable.  Check of electrolytes showed recovered hyponatremia with sodium down to 128.  He was also noted to have issues with hypotension as well as AKI and was treated with IV fluids briefly.  Hospital course was significant for ongoing issues with wound drainage as well as low-grade fevers.  He did develop hypotension with tachycardia as well as rising temp to 100.4 on 10/10 due to SIRS.  He was started on broad-spectrum antibiotics and was pancultured. Chest x-ray done was negative for acute changes.  UA/urine culture was negative for infection but 2/2 blood cultures grew Kleb pneumonia.  Neurosurgery was consulted for input and recommended local care for management of back incision and did not feel that back was a source of infection.  Antibiotics were narrowed to ceftriaxone and ID was consulted for input.  Dr. Cheri Guppy recommended continuing ceftriaxone as well as imaging for further work-up.  CT of abdomen pelvis was done due to ongoing issues with abdominal discomfort and was negative for acute changes but did show enlarged spleen.  2 D echo was negative for endocarditis. BLE dopplers were negative for DVT. General surgery was contacted for input and recommended ruling out mono and for follow-up on outpatient basis.  EBV panel indicated  infection with EBV at some time in the past.  Lumbar spine CT done due to ongoing back drainage and showed fluid collection in the soft tissues and musculature posterior to L1/S1 posterior fixation most likely postop seroma and soft tissue component of collection extending from T10-S1 without significant enhancement to suggest abscess.  Neurosurgery was contacted again for  input and recommended taking patient to the OR for cleanout of wound and no further intervention. He was discharged to acute hospital on 01/25/21   REMMedications at discharge: Vitamin C 500 mg po bid  Ceftriaxone 2 g IV every 24 hours Valium 5 mg Diclofenac  gel 2 g to right knee 4 times daily Gabapentin 400 mg p.o. AC at bedtime Glucotrol 5 mg p.o. twice daily Metformin 1000 mg p.o. twice daily Mag-Ox 200 mg p.o. twice daily MS Contin 50 mg p.o. twice daily Multivitamin 1 p.o. daily Muscle rub to back and BLE 4 times daily  Juven 1 packet p.o. twice daily Oxycodone 10 to 15 mg p.o. every 4 hours as needed severe pain Protonix 40 mg per day MiraLAX 17 g in 8 ounces p.o. daily Tizanidine 4 mg p.o. 3 times daily Tramadol 50 mg p.o. 4 times daily as needed moderate pain Trazodone 50 mg p.o. nightly as needed insomnia Zinc sulfate to 20 mg p.o. per day 20.  Ensure max twice between meals  Special instructions: Continue Foley to straight drain. Monitor blood sugars AC at bedtime and use sliding scale insulin for tighter blood sugar control.  Diet: Carb modified.   Disposition: Acute hospital      Signed: Bary Leriche 01/25/2021, 6:34 PM

## 2021-01-25 NOTE — Anesthesia Preprocedure Evaluation (Addendum)
Anesthesia Evaluation  Patient identified by MRN, date of birth, ID band Patient awake    Reviewed: Allergy & Precautions, NPO status , Patient's Chart, lab work & pertinent test results  Airway Mallampati: III  TM Distance: >3 FB Neck ROM: Full    Dental  (+) Missing   Pulmonary sleep apnea and Continuous Positive Airway Pressure Ventilation ,    Pulmonary exam normal breath sounds clear to auscultation       Cardiovascular hypertension, Pt. on medications Normal cardiovascular exam Rhythm:Regular Rate:Normal     Neuro/Psych  Neuromuscular disease negative psych ROS   GI/Hepatic negative GI ROS, (+)     substance abuse  ,   Endo/Other  diabetes, Oral Hypoglycemic AgentsMorbid obesity  Renal/GU negative Renal ROS     Musculoskeletal  (+) Arthritis , Chronic pain syndrome   Abdominal (+) + obese,   Peds  Hematology  (+) anemia , HLD   Anesthesia Other Findings Infected Lumbar wound  Reproductive/Obstetrics                            Anesthesia Physical Anesthesia Plan  ASA: 3  Anesthesia Plan: General   Post-op Pain Management:    Induction: Intravenous  PONV Risk Score and Plan: 2 and Ondansetron, Dexamethasone, Midazolam and Treatment may vary due to age or medical condition  Airway Management Planned: Oral ETT  Additional Equipment:   Intra-op Plan:   Post-operative Plan: Extubation in OR  Informed Consent: I have reviewed the patients History and Physical, chart, labs and discussed the procedure including the risks, benefits and alternatives for the proposed anesthesia with the patient or authorized representative who has indicated his/her understanding and acceptance.     Dental advisory given  Plan Discussed with: CRNA  Anesthesia Plan Comments:        Anesthesia Quick Evaluation

## 2021-01-25 NOTE — Op Note (Addendum)
   Providing Compassionate, Quality Care - Together  Date of service: 01/25/2021  PREOP DIAGNOSIS:  Lumbar wound infection  POSTOP DIAGNOSIS: Same  PROCEDURE: Incision and debridement of lumbar wound, placement of wound VAC  SURGEON: Dr. Pieter Partridge C. Volney Reierson, DO  ASSISTANT: None  ANESTHESIA: General Endotracheal  EBL: 50 cc  SPECIMENS: Wound culture  DRAINS: Wound VAC  COMPLICATIONS: None  CONDITION: Hemodynamically stable  HISTORY: [name] is a 55 y.o. male that underwent an L1-S1 laminectomy and fusion by Dr. Zada Finders on 01/11/2021.  He began having wound drainage approximately 1 week ago while in rehab.  Work-up revealed bacteremia, he had persistent wound drainage and therefore given his bacteremia I recommended incision and debridement and placement of wound VAC.  All risks, benefits and expected outcomes were discussed and agreed upon with the patient and his wife.  PROCEDURE IN DETAIL: The patient was brought to the operating room. After induction of general anesthesia, the patient was positioned on the operative table in the prone position. All pressure points were meticulously padded.  The previous incision was marked and sterilely prepped and draped in a normal fashion.  Physician driven timeout was performed.  Using a 10 blade previous incision was opened in midline.  There were multiple areas that had not healed very well.  There is a moderate amount of suprafascial chronic hematoma with some slight purulence.  There is no frank abscess.  The fascia appeared to be not completely healed therefore this was opened sharply.  The wound was cultured and sent for analysis.  Again there was some chronic hematoma and slightly purulent material.  The wound edges were appropriately debrided sharply.  They were down to healthy normal tissue at this point.  Hemostasis was achieved with monopolar cautery.  A deep wound VAC sponge was placed.  This was placed on continuous therapy  and was holding suction well.  The drapes were taken down.  At the end of the case all sponge, needle, and instrument counts were correct. The patient was then transferred to the stretcher, extubated, and taken to the post-anesthesia care unit in stable hemodynamic condition.

## 2021-01-25 NOTE — Anesthesia Postprocedure Evaluation (Signed)
Anesthesia Post Note  Patient: [name]  Procedure(s) Performed: LUMBAR WOUND DEBRIDEMENT APPLICATION OF WOUND VAC     Patient location during evaluation: PACU Anesthesia Type: General Level of consciousness: awake Pain management: pain level controlled Vital Signs Assessment: post-procedure vital signs reviewed and stable Respiratory status: spontaneous breathing Cardiovascular status: stable Postop Assessment: no apparent nausea or vomiting Anesthetic complications: no   No notable events documented.  Last Vitals:  Vitals:   01/25/21 1806 01/25/21 1827  BP: (!) 151/81 (!) 157/83  Pulse: (!) 102 99  Resp: 17 18  Temp: 36.8 C 37.1 C  SpO2: 94% 96%    Last Pain:  Vitals:   01/25/21 1842  TempSrc:   PainSc: 8                  Maeci Kalbfleisch

## 2021-01-26 ENCOUNTER — Other Ambulatory Visit: Payer: Self-pay

## 2021-01-26 MED ORDER — CEFAZOLIN SODIUM-DEXTROSE 2-4 GM/100ML-% IV SOLN
2.0000 g | Freq: Three times a day (TID) | INTRAVENOUS | Status: DC
  Administered 2021-01-26 – 2021-01-31 (×15): 2 g via INTRAVENOUS
  Filled 2021-01-26 (×18): qty 100

## 2021-01-26 MED ORDER — INFLUENZA VAC SPLIT QUAD 0.5 ML IM SUSY
0.5000 mL | PREFILLED_SYRINGE | INTRAMUSCULAR | Status: DC
  Filled 2021-01-26: qty 0.5

## 2021-01-26 MED ORDER — TAMSULOSIN HCL 0.4 MG PO CAPS
0.4000 mg | ORAL_CAPSULE | Freq: Every day | ORAL | Status: DC
  Administered 2021-01-26 – 2021-01-31 (×6): 0.4 mg via ORAL
  Filled 2021-01-26 (×7): qty 1

## 2021-01-26 NOTE — Progress Notes (Signed)
Inpatient Rehabilitation Admissions Coordinator   Patient readmitted to acute hospital after surgery. I await therapy reevaluations and then can begin [facility] approval to readmit  to CIR when medically ready next week.  Danne Baxter, RN, MSN Rehab Admissions Coordinator [phone] 01/26/2021 9:38 AM

## 2021-01-26 NOTE — Progress Notes (Signed)
PT placed CPAP on himself. Vitals are stable with a pressure of 15 cm H2O.

## 2021-01-26 NOTE — Evaluation (Signed)
Occupational Therapy Evaluation Patient Details Name: [name] MRN: [id number] DOB: 07/20/1965 Today's Date: 01/26/2021   History of Present Illness 55 y.o. M admitted on 9/30 for L1-2, L2-3, L3-4, L4-5, L5-S1 laminectomies, transforaminal lumbar interbody fusion. CIR 10/7 -10/14. S/P I&D and placement of wound vac 10/14 d/t wound infection.   PMH significant for DDD, DM2, HTN.   Clinical Impression   Pt admitted from CIR after I&D of back wound. Presenting with pain, weakness, back precautions, and impaired balance.  He requires max assist for LB ADLs, up to min assist for transfers and limited in room mobility using RW. Believe he will benefit from continued OT services while admitted and after dc at CIR level to optimize return to PLOF.  Will follow.      Recommendations for follow up therapy are one component of a multi-disciplinary discharge planning process, led by the attending physician.  Recommendations may be updated based on patient status, additional functional criteria and insurance authorization.   Follow Up Recommendations  CIR    Equipment Recommendations  Other (comment) (TBD)    Recommendations for Other Services Rehab consult     Precautions / Restrictions Precautions Precautions: Fall;Back Precaution Booklet Issued: No Precaution Comments: wound vac Required Braces or Orthoses: Other Brace Other Brace: no brace needed per order Restrictions Weight Bearing Restrictions: No      Mobility Bed Mobility               General bed mobility comments: OOB upon entry    Transfers Overall transfer level: Needs assistance Equipment used: Rolling walker (2 wheeled) Transfers: Sit to/from Stand Sit to Stand: Min assist         General transfer comment: minA to rise and steady from commode and low recliner surface. Cues for hand placement    Balance Overall balance assessment: Needs assistance Sitting-balance support: No upper extremity  supported;Feet supported Sitting balance-Leahy Scale: Fair     Standing balance support: Bilateral upper extremity supported;No upper extremity supported;During functional activity Standing balance-Leahy Scale: Poor Standing balance comment: Reliant on B UE support on the RW                           ADL either performed or assessed with clinical judgement   ADL Overall ADL's : Needs assistance/impaired     Grooming: Min guard;Standing           Upper Body Dressing : Minimal assistance;Sitting   Lower Body Dressing: Maximal assistance;Sit to/from stand   Toilet Transfer: Minimal assistance;Ambulation;RW   Toileting- Clothing Manipulation and Hygiene: Maximal assistance;Sitting/lateral lean;Sit to/from stand       Functional mobility during ADLs: Minimal assistance;Rolling walker;Cueing for safety General ADL Comments: pt limtied by pain, weakness, and balance     Vision   Vision Assessment?: No apparent visual deficits     Perception     Praxis      Pertinent Vitals/Pain Pain Assessment: Faces Faces Pain Scale: Hurts whole lot Pain Location: back Pain Descriptors / Indicators: Discomfort;Grimacing;Guarding;Cramping Pain Intervention(s): Monitored during session;Repositioned     Hand Dominance Left   Extremity/Trunk Assessment Upper Extremity Assessment Upper Extremity Assessment: Generalized weakness   Lower Extremity Assessment Lower Extremity Assessment: Defer to PT evaluation   Cervical / Trunk Assessment Cervical / Trunk Assessment: Other exceptions Cervical / Trunk Exceptions: s/p surgery   Communication Communication Communication: No difficulties   Cognition Arousal/Alertness: Awake/alert Behavior During Therapy: WFL for tasks assessed/performed Overall Cognitive  Status: Within Functional Limits for tasks assessed                                     General Comments       Exercises     Shoulder Instructions       Home Living Family/patient expects to be discharged to:: Inpatient rehab Living Arrangements: Spouse/significant other                                      Prior Functioning/Environment Level of Independence: Needs assistance  Gait / Transfers Assistance Needed: reports ambulating 136' at CIR with assistance and RW ADL's / Homemaking Assistance Needed: needing assist for LB ADLs in rehab   Comments: Prior to initial surgery, patient was independent but not working due to pain        OT Problem List: Decreased strength;Decreased range of motion;Decreased activity tolerance;Impaired balance (sitting and/or standing);Decreased safety awareness;Decreased knowledge of use of DME or AE;Pain      OT Treatment/Interventions: Self-care/ADL training;Therapeutic exercise;Energy conservation;DME and/or AE instruction;Therapeutic activities;Patient/family education;Balance training    OT Goals(Current goals can be found in the care plan section) Acute Rehab OT Goals Patient Stated Goal: go back to rehab OT Goal Formulation: With patient Time For Goal Achievement: 02/09/21 Potential to Achieve Goals: Good  OT Frequency: Min 2X/week   Barriers to D/C:            Co-evaluation              AM-PAC OT "6 Clicks" Daily Activity     Outcome Measure Help from another person eating meals?: None Help from another person taking care of personal grooming?: A Little Help from another person toileting, which includes using toliet, bedpan, or urinal?: A Lot Help from another person bathing (including washing, rinsing, drying)?: A Lot Help from another person to put on and taking off regular upper body clothing?: A Little Help from another person to put on and taking off regular lower body clothing?: A Lot 6 Click Score: 16   End of Session Equipment Utilized During Treatment: Rolling walker Nurse Communication: Mobility status  Activity Tolerance: Patient tolerated  treatment well Patient left: in chair;with call bell/phone within reach;with chair alarm set  OT Visit Diagnosis: Unsteadiness on feet (R26.81);Other abnormalities of gait and mobility (R26.89);Muscle weakness (generalized) (M62.81);Pain Pain - part of body:  (back)                Time: 1000-1008 OT Time Calculation (min): 8 min Charges:  OT General Charges $OT Visit: 1 Visit OT Evaluation $OT Eval Moderate Complexity: 1 Mod  Jolaine Artist, OT Acute Rehabilitation Services Pager [phone] Office [phone]   Delight Stare 01/26/2021, 12:56 PM

## 2021-01-26 NOTE — Progress Notes (Signed)
Subjective: The patient is alert and pleasant.  He complains of urinary retention.  They took the catheter out around 6 but he has not urinated yet.  Objective: Vital signs in last 24 hours: Temp:  [97.7 F (36.5 C)-99.1 F (37.3 C)] 97.7 F (36.5 C) (10/15 0818) Pulse Rate:  [88-102] 90 (10/15 0818) Resp:  [15-20] 18 (10/15 0818) BP: (122-157)/(63-85) 136/85 (10/15 0818) SpO2:  [94 %-100 %] 98 % (10/15 0818) Estimated body mass index is 41.69 kg/m as calculated from the following:   Height as of 01/18/21: 6\' 2"  (1.88 m).   Weight as of 01/18/21: 147.3 kg.   Intake/Output from previous day: 10/14 0701 - 10/15 0700 In: 1140 [P.O.:240; I.V.:800; IV Piggyback:100] Out: 37858 [IFOYD:74128; Drains:100; Blood:50] Intake/Output this shift: No intake/output data recorded.  Physical exam patient is alert and pleasant.  He is moving his lower extremities well.  Lab Results: Recent Labs    01/24/21 0503  WBC 4.0  HGB 8.4*  HCT 24.4*  PLT 206   BMET Recent Labs    01/24/21 0503  NA 133*  K 3.7  CL 99  CO2 24  GLUCOSE 118*  BUN 15  CREATININE 0.92  CALCIUM 8.1*    Studies/Results: CT LUMBAR SPINE W CONTRAST  Result Date: 01/24/2021 CLINICAL DATA:  Low back pain, infection suspected EXAM: CT LUMBAR SPINE WITH CONTRAST TECHNIQUE: Multidetector CT imaging of the lumbar spine was performed with intravenous contrast administration. CONTRAST:  176mL OMNIPAQUE IOHEXOL 350 MG/ML SOLN COMPARISON:  01/11/2021 CT lumbar spine, 01/22/2021 CT abdomen pelvis FINDINGS: Segmentation: 5 lumbar type vertebrae. Alignment: Mild dextrocurvature of the lumbar spine. Vertebrae: Status post posterior fixation L1-S1, with interbody disc spacers and posterior decompression, which appear unchanged compared to 01/22/2021. Evaluation is somewhat limited by beam hardening artifact from hardware, but no acute fractures seen. Fusion of the left sacroiliac joint Paraspinal and other soft tissues: Fluid and  small amount of air in the soft tissues and musculature posterior to the fixation and decompression site, grossly unchanged compared to 01/22/2021. The soft tissue component of fluid collection extends from the level of T10 to S1, and measures up to 2.2 x 4.1 cm (series 6, image 97), just to the right of midline; the portion that extends into the operative bed is difficult to measure given beam hardening artifact from hardware, but the collection likely extends from L1 through S1 and extends to the posterior aspect of the thecal sac (series 6, image 93). High density material and air is noted in the fluid collection. No significant enhancement. No prevertebral collection. Disc levels: Evaluation is limited by beam hardening artifact from hardware and postoperative fluid collection. No osseous spinal canal stenosis, status post extensive posterior decompression. IMPRESSION: Fluid collection with a small amount of air and high density material in the soft tissues and musculature posterior to the L1-S1 posterior fixation and decompression site, most likely a postoperative seroma. The soft tissue component of the collection extends from T10-S1. The intramuscular and operative bed portion of the collection is difficult to visualize secondary to beam hardening artifact from adjacent hardware; however it appears to extend to the posterior aspect of the thecal sac. No significant enhancement to suggest abscess. No prevertebral collection. Electronically Signed   By: Merilyn Baba M.D.   On: 01/24/2021 14:16   ECHOCARDIOGRAM COMPLETE  Result Date: 01/24/2021    ECHOCARDIOGRAM REPORT   Patient Name:   [name] Date of Exam: 01/23/2021 Medical Rec #:  [id number]  Height:       74.0 in Accession #:    [id number]   Weight:       324.7 lb Date of Birth:  [birth year]    BSA:          2.671 m Patient Age:    55 years     BP:           118/78 mmHg Patient Gender: M            HR:           110 bpm. Exam Location:  Inpatient  Procedure: 2D Echo, Cardiac Doppler and Color Doppler Indications:    Bacteremia  History:        Patient has no prior history of Echocardiogram examinations.                 Morbid Obesity. Bacteremia post back surgery.  Sonographer:    Merrie Roof RDCS Referring Phys: [id number] [place]  1. Left ventricular ejection fraction, by estimation, is 60 to 65%. The left ventricle has normal function. The left ventricle has no regional wall motion abnormalities. Left ventricular diastolic parameters were normal.  2. Right ventricular systolic function is normal. The right ventricular size is normal.  3. The mitral valve is normal in structure. No evidence of mitral valve regurgitation. No evidence of mitral stenosis.  4. The aortic valve is tricuspid. Aortic valve regurgitation is not visualized. No aortic stenosis is present.  5. The inferior vena cava is normal in size with greater than 50% respiratory variability, suggesting right atrial pressure of 3 mmHg. FINDINGS  Left Ventricle: Left ventricular ejection fraction, by estimation, is 60 to 65%. The left ventricle has normal function. The left ventricle has no regional wall motion abnormalities. The left ventricular internal cavity size was normal in size. There is  no left ventricular hypertrophy. Left ventricular diastolic parameters were normal. Right Ventricle: The right ventricular size is normal. Right ventricular systolic function is normal. Left Atrium: Left atrial size was normal in size. Right Atrium: Right atrial size was normal in size. Pericardium: There is no evidence of pericardial effusion. Mitral Valve: The mitral valve is normal in structure. No evidence of mitral valve regurgitation. No evidence of mitral valve stenosis. Tricuspid Valve: The tricuspid valve is normal in structure. Tricuspid valve regurgitation is trivial. No evidence of tricuspid stenosis. Aortic Valve: The aortic valve is tricuspid. Aortic valve regurgitation is  not visualized. No aortic stenosis is present. Aortic valve mean gradient measures 5.0 mmHg. Aortic valve peak gradient measures 9.6 mmHg. Pulmonic Valve: The pulmonic valve was grossly normal. Pulmonic valve regurgitation is not visualized. No evidence of pulmonic stenosis. Aorta: The aortic root is normal in size and structure. Venous: The inferior vena cava is normal in size with greater than 50% respiratory variability, suggesting right atrial pressure of 3 mmHg. IAS/Shunts: No atrial level shunt detected by color flow Doppler.  LEFT VENTRICLE PLAX 2D LVIDd:         4.50 cm   Diastology LVIDs:         2.90 cm   LV e' medial:    11.40 cm/s LV PW:         1.30 cm   LV E/e' medial:  8.3 LV IVS:        1.10 cm   LV e' lateral:   12.60 cm/s LVOT diam:     2.20 cm   LV E/e' lateral: 7.5 LV SV:  51 LV SV Index:   19 LVOT Area:     3.80 cm  RIGHT VENTRICLE RV Basal diam:  3.50 cm LEFT ATRIUM           Index        RIGHT ATRIUM           Index LA diam:      3.80 cm 1.42 cm/m   RA Area:     18.40 cm LA Vol (A4C): 61.4 ml 22.99 ml/m  RA Volume:   49.20 ml  18.42 ml/m  AORTIC VALVE AV Area (Vmax):    2.50 cm AV Area (Vmean):   2.41 cm AV Area (VTI):     2.32 cm AV Vmax:           155.00 cm/s AV Vmean:          104.000 cm/s AV VTI:            0.221 m AV Peak Grad:      9.6 mmHg AV Mean Grad:      5.0 mmHg LVOT Vmax:         102.00 cm/s LVOT Vmean:        66.000 cm/s LVOT VTI:          0.135 m LVOT/AV VTI ratio: 0.61  AORTA Ao Root diam: 3.40 cm Ao Asc diam:  3.30 cm MITRAL VALVE MV Area (PHT): 4.49 cm    SHUNTS MV Decel Time: 169 msec    Systemic VTI:  0.14 m MV E velocity: 94.80 cm/s  Systemic Diam: 2.20 cm MV A velocity: 90.60 cm/s MV E/A ratio:  1.05 Kirk Ruths MD Electronically signed by Kirk Ruths MD Signature Date/Time: 01/24/2021/3:04:14 PM    Final     Assessment/Plan: Status post incision and drainage of wound and placement of wound VAC: We will await the wound cultures.  He will likely  need a PICC line and prolonged IV antibiotics.  Urinary retention: I will add Flomax and will get a bladder scan.  Hopefully this will resolve or he may need to have the catheter replaced.  LOS: 1 day     [name] 01/26/2021, 9:55 AM     Patient ID: [name], male   DOB: February 05, 1966, 55 y.o.   MRN: [id number]

## 2021-01-26 NOTE — Progress Notes (Signed)
Pt had foley catheter removed at 0600 on 10/15 and was due to void at 1200. Pt did not void and bladder scan showed 34ml. Bladder scan was done again at 1530 and the scan showed 455ml. The pt felt no discomfort in his bladder or lower abdomen due to his pain in the back. I did a straight cath and pulled off 1000 ml of urine. Pt tolerated straight cath well. Will continue to monitor.

## 2021-01-26 NOTE — Evaluation (Signed)
Physical Therapy Evaluation Patient Details Name: [name] MRN: [id number] DOB: [birth year] Today's Date: 01/26/2021  History of Present Illness  54 y.o. M admitted on 9/30 for L1-2, L2-3, L3-4, L4-5, L5-S1 laminectomies, transforaminal lumbar interbody fusion. CIR 10/7 -10/14. S/P I&D and placement of wound vac 10/14 d/t wound infection.   PMH significant for DDD, DM2, HTN.  Clinical Impression  Patient recently admitted for above diagnosis from CIR. Patient presents with generalized weakness, pain, decreased activity tolerance, impaired balance. Patient requires minA for sit to stand and short ambulation distance. Distance limited by fatigue this date. Reinforced back precautions with patient. Patient will benefit from skilled PT services during acute stay to address listed deficits. Recommend return to CIR at discharge to maximize functional independence and safety prior to returning home.        Recommendations for follow up therapy are one component of a multi-disciplinary discharge planning process, led by the attending physician.  Recommendations may be updated based on patient status, additional functional criteria and insurance authorization.  Follow Up Recommendations CIR    Equipment Recommendations  Rolling Elma Shands with 5" wheels    Recommendations for Other Services Rehab consult     Precautions / Restrictions Precautions Precautions: Fall;Back Precaution Booklet Issued: No Required Braces or Orthoses: Other Brace Other Brace: no brace needed per order Restrictions Weight Bearing Restrictions: No      Mobility  Bed Mobility               General bed mobility comments: in restroom with OT on arriavl    Transfers Overall transfer level: Needs assistance Equipment used: Rolling Fatema Rabe (2 wheeled) Transfers: Sit to/from Stand Sit to Stand: Min assist         General transfer comment: minA to rise and steady from commode and low recliner surface. Cues  for hand placement  Ambulation/Gait Ambulation/Gait assistance: Min assist;+2 safety/equipment Gait Distance (Feet): 12 Feet Assistive device: Rolling Milianna Ericsson (2 wheeled) Gait Pattern/deviations: Decreased stride length;Trunk flexed;Wide base of support;Step-to pattern Gait velocity: decreased   General Gait Details: heavy use of RW and small steps. Unsteadiness noted with fatigue. Cues for upright posture due to patient tendency to ambulate with flexed trunk  Stairs            Wheelchair Mobility    Modified Rankin (Stroke Patients Only)       Balance Overall balance assessment: Needs assistance Sitting-balance support: Bilateral upper extremity supported;Feet supported Sitting balance-Leahy Scale: Fair     Standing balance support: Bilateral upper extremity supported;During functional activity Standing balance-Leahy Scale: Poor Standing balance comment: Reliant on B UE support on the RW                             Pertinent Vitals/Pain Pain Assessment: Faces Faces Pain Scale: Hurts whole lot Pain Location: back Pain Descriptors / Indicators: Discomfort;Grimacing;Guarding;Cramping Pain Intervention(s): Monitored during session    Home Living Family/patient expects to be discharged to:: Inpatient rehab                      Prior Function Level of Independence: Needs assistance   Gait / Transfers Assistance Needed: reports ambulating 136' at [facility] with assistance and RW     Comments: Prior to initial surgery, patient was independent but not working due to pain     Hand Dominance        Extremity/Trunk Assessment   Upper Extremity Assessment Upper  Extremity Assessment: Defer to OT evaluation    Lower Extremity Assessment Lower Extremity Assessment: Generalized weakness (reports sensation intact)    Cervical / Trunk Assessment Cervical / Trunk Assessment: Other exceptions Cervical / Trunk Exceptions: s/p surgery  Communication    Communication: No difficulties  Cognition Arousal/Alertness: Awake/alert Behavior During Therapy: WFL for tasks assessed/performed Overall Cognitive Status: Within Functional Limits for tasks assessed                                        General Comments      Exercises     Assessment/Plan    PT Assessment Patient needs continued PT services  PT Problem List Decreased strength;Decreased activity tolerance;Decreased balance;Decreased mobility;Decreased knowledge of use of DME;Decreased knowledge of precautions;Decreased safety awareness;Pain       PT Treatment Interventions DME instruction;Gait training;Stair training;Functional mobility training;Therapeutic activities;Therapeutic exercise;Neuromuscular re-education;Patient/family education    PT Goals (Current goals can be found in the Care Plan section)  Acute Rehab PT Goals Patient Stated Goal: go back to rehab PT Goal Formulation: With patient Time For Goal Achievement: 02/09/21 Potential to Achieve Goals: Good    Frequency Min 5X/week   Barriers to discharge        Co-evaluation               AM-PAC PT "6 Clicks" Mobility  Outcome Measure Help needed turning from your back to your side while in a flat bed without using bedrails?: A Little Help needed moving from lying on your back to sitting on the side of a flat bed without using bedrails?: A Little Help needed moving to and from a bed to a chair (including a wheelchair)?: A Little Help needed standing up from a chair using your arms (e.g., wheelchair or bedside chair)?: A Little Help needed to walk in hospital room?: Total Help needed climbing 3-5 steps with a railing? : Total 6 Click Score: 14    End of Session   Activity Tolerance: Patient limited by pain;Patient limited by fatigue Patient left: in chair;with call bell/phone within reach;with chair alarm set Nurse Communication: Mobility status PT Visit Diagnosis: Unsteadiness on feet  (R26.81);Pain    Time: 1008-1016 PT Time Calculation (min) (ACUTE ONLY): 8 min   Charges:   PT Evaluation $PT Eval Moderate Complexity: 1 Mod          Jovannie Ulibarri A. Gilford Rile PT, DPT Acute Rehabilitation Services Pager [phone] Office [phone]   Linna Hoff 01/26/2021, 10:45 AM

## 2021-01-26 NOTE — Progress Notes (Signed)
ID Brief Note   Patient not seen, chart reviewed.   Remains afebrile, WBC 4 S/p OR by DR Dawley on 01/25/21 for I and D of lumbar wound and placement of wound vac  OR notes reviewed " multiple areas that had not healed very well.  There is a moderate amount of suprafascial chronic hematoma with some slight purulence.  There is no frank abscess.  The fascia appeared to be not completely healed therefore this was opened sharply.  The wound was cultured and sent for analysis.  Again there was some chronic hematoma and slightly purulent material.  The wound edges were appropriately debrided sharply.  They were down to healthy normal tissue at this point."  Appreciate NeuroSx help in this patient EBV serology reviewed, suggestive of past infection and not acute infectious mononucleosis.  Plan  Continue cefazolin as is  Repeat blood cx 10/12 no growth in 3 days  Fu OR cultures  Monitor CBC and BMP  Rosiland Oz, MD Infectious Disease Physician [facility] [address] [place], [place] [address] Phone: [phone]  Fax: [phone]

## 2021-01-27 LAB — C-REACTIVE PROTEIN: CRP: 7.9 mg/dL — ABNORMAL HIGH (ref <1.0)

## 2021-01-27 LAB — SEDIMENTATION RATE: Sed Rate: 65 mm/hr — ABNORMAL HIGH (ref 0–16)

## 2021-01-27 MED ORDER — CHLORHEXIDINE GLUCONATE CLOTH 2 % EX PADS
6.0000 | MEDICATED_PAD | Freq: Every day | CUTANEOUS | Status: DC
  Administered 2021-01-27 – 2021-01-30 (×3): 6 via TOPICAL

## 2021-01-27 MED ORDER — MUSCLE RUB 10-15 % EX CREA
TOPICAL_CREAM | CUTANEOUS | Status: DC | PRN
  Filled 2021-01-27: qty 85

## 2021-01-27 NOTE — Consult Note (Signed)
WOC Nurse Consult Note: Consult received for MWF vac changes. Request a Neuro MD or PA to be present for first vac change. Secretary to order foam dressing kit for Monday  Vicki L. Smith, MSN, RN, CMSRN, AGCNS, WTA Wound Treatment Associate Pager [phone]       

## 2021-01-27 NOTE — Progress Notes (Signed)
PT Cancellation Note  Patient Details Name: JAKIAH GOREE MRN: [id number] DOB: [birth year]   Cancelled Treatment:    Reason Eval/Treat Not Completed: Pain limiting ability to participate. Pt reporting severe pain and no relief after pain meds this morning. PT to re-attempt as time allows.   Lorriane Shire 01/27/2021, 10:11 AM  Lorrin Goodell, PT  Office # [phone] Pager [phone]

## 2021-01-27 NOTE — Progress Notes (Signed)
Physical Therapy Treatment Patient Details Name: [name] MRN: [id number] DOB: 1965-09-27 Today's Date: 01/27/2021   History of Present Illness 55 y.o. M admitted on 9/30 for L1-2, L2-3, L3-4, L4-5, L5-S1 laminectomies, transforaminal lumbar interbody fusion. CIR 10/7 -10/14. S/P I&D and placement of wound vac 10/14 d/t wound infection.   PMH significant for DDD, DM2, HTN.    PT Comments    Session limited to bed mobility only due to pain. Pt required min assist rolling, and mod assist sidelying <> sit. Pt reporting 10/10 pain, with cramping/muscle spasms on R side. +2 max assist to slide up in bed. Pillows positioned under R side/hip to assist with pressure relief. RN reports pt has had pain from urinary retention. She inserted foley catheter this morning. Pt remained in bed at end of session.    Recommendations for follow up therapy are one component of a multi-disciplinary discharge planning process, led by the attending physician.  Recommendations may be updated based on patient status, additional functional criteria and insurance authorization.  Follow Up Recommendations  CIR     Equipment Recommendations  Rolling walker with 5" wheels    Recommendations for Other Services       Precautions / Restrictions Precautions Precautions: Fall;Back;Other (comment) Precaution Comments: wound vac Other Brace: no brace needed per order     Mobility  Bed Mobility Overal bed mobility: Needs Assistance Bed Mobility: Sit to Sidelying;Rolling;Sidelying to Sit Rolling: Min assist Sidelying to sit: Mod assist     Sit to sidelying: Mod assist General bed mobility comments: cues for sequencing/logroll, +2 max assist to slide up in bed using bed pad    Transfers                 General transfer comment: unable due to pain  Ambulation/Gait                 Stairs             Wheelchair Mobility    Modified Rankin (Stroke Patients Only)       Balance                                             Cognition Arousal/Alertness: Awake/alert Behavior During Therapy: WFL for tasks assessed/performed Overall Cognitive Status: Within Functional Limits for tasks assessed                                        Exercises      General Comments        Pertinent Vitals/Pain Pain Assessment: 0-10 Pain Score: 10-Worst pain ever Pain Location: back Pain Descriptors / Indicators: Discomfort;Grimacing;Guarding;Cramping Pain Intervention(s): Repositioned;Limited activity within patient's tolerance;Premedicated before session    Home Living                      Prior Function            PT Goals (current goals can now be found in the care plan section) Acute Rehab PT Goals Patient Stated Goal: go back to rehab Progress towards PT goals: Not progressing toward goals - comment (increased pain)    Frequency    Min 5X/week      PT Plan Current plan remains appropriate    Co-evaluation  AM-PAC PT "6 Clicks" Mobility   Outcome Measure  Help needed turning from your back to your side while in a flat bed without using bedrails?: A Little Help needed moving from lying on your back to sitting on the side of a flat bed without using bedrails?: A Lot Help needed moving to and from a bed to a chair (including a wheelchair)?: A Lot Help needed standing up from a chair using your arms (e.g., wheelchair or bedside chair)?: A Lot Help needed to walk in hospital room?: Total Help needed climbing 3-5 steps with a railing? : Total 6 Click Score: 11    End of Session   Activity Tolerance: Patient limited by pain Patient left: in bed;with call bell/phone within reach Nurse Communication: Mobility status PT Visit Diagnosis: Unsteadiness on feet (R26.81);Pain Pain - Right/Left: Right     Time: 1230-1240 PT Time Calculation (min) (ACUTE ONLY): 10 min  Charges:  $Therapeutic  Activity: 8-22 mins                     Lorrin Goodell, PT  Office # [phone] Pager [phone]    [name] 01/27/2021, 2:07 PM

## 2021-01-27 NOTE — Progress Notes (Signed)
Subjective: The patient is alert and pleasant.  He complains of some muscle cramps and requested Bengay.  He continues to have urinary retention and was catheterized twice yesterday.  Objective: Vital signs in last 24 hours: Temp:  [98.1 F (36.7 C)-99 F (37.2 C)] 98.8 F (37.1 C) (10/16 0853) Pulse Rate:  [89-102] 100 (10/16 0853) Resp:  [18-20] 18 (10/16 0853) BP: (91-131)/(51-79) 109/66 (10/16 0853) SpO2:  [92 %-100 %] 96 % (10/16 0853) Estimated body mass index is 41.69 kg/m as calculated from the following:   Height as of 01/18/21: 6' 2" (1.88 m).   Weight as of 01/18/21: 147.3 kg.   Intake/Output from previous day: 10/15 0701 - 10/16 0700 In: 440 [P.O.:240; IV Piggyback:200] Out: 1600 [Urine:1600] Intake/Output this shift: No intake/output data recorded.  Physical exam patient is alert and pleasant.  His strength is normal.  Lab Results: No results for input(s): WBC, HGB, HCT, PLT in the last 72 hours. BMET No results for input(s): NA, K, CL, CO2, GLUCOSE, BUN, CREATININE, CALCIUM in the last 72 hours.  Studies/Results:  The patient's cultures are negative so far but there was abundant leukocytes. No results found.  Assessment/Plan: Postop day #2: We are still awaiting the patient's culture results.  We will get repeat cultures in anticipation of placement of a PICC line for prolonged IV antibiotics.  Also order an ESR and sed rate.  Urinary retention: If he has to be catheterized again we will keep the Foley in.  LOS: 2 days     [name] 01/27/2021, 9:24 AM     Patient ID: [name], male   DOB: February 12, 1966, 55 y.o.   MRN: [id number]

## 2021-01-27 NOTE — Progress Notes (Addendum)
Patient with 2 straight cath from previous shift and is DTV at 0900. Patient expressed the urge to void with abdomen pressure but unable at this time. Bladder scanned of 752ml. Rounding MD Dr. Arnoldo Morale with oder to insert foley catheter. Post foley insertion, pt shares that he feels less pressure and feeling much better. Will continue to monitor.   Ave Filter, RN

## 2021-01-27 NOTE — Plan of Care (Signed)
  Problem: Health Behavior/Discharge Planning: Goal: Ability to manage health-related needs will improve Outcome: Not Progressing   

## 2021-01-28 ENCOUNTER — Inpatient Hospital Stay: Payer: Self-pay

## 2021-01-28 DIAGNOSIS — R7881 Bacteremia: Secondary | ICD-10-CM

## 2021-01-28 DIAGNOSIS — A498 Other bacterial infections of unspecified site: Secondary | ICD-10-CM

## 2021-01-28 DIAGNOSIS — B961 Klebsiella pneumoniae [K. pneumoniae] as the cause of diseases classified elsewhere: Secondary | ICD-10-CM

## 2021-01-28 LAB — CULTURE, BLOOD (ROUTINE X 2)
Culture: NO GROWTH
Culture: NO GROWTH
Special Requests: ADEQUATE
Special Requests: ADEQUATE

## 2021-01-28 MED ORDER — HYDROMORPHONE HCL 1 MG/ML IJ SOLN
1.0000 mg | INTRAMUSCULAR | Status: DC | PRN
  Administered 2021-01-28 – 2021-01-30 (×7): 1 mg via INTRAVENOUS
  Filled 2021-01-28 (×7): qty 1

## 2021-01-28 MED ORDER — SODIUM CHLORIDE 0.9% FLUSH: 10.0000 mL | INTRAVENOUS | Status: DC | PRN

## 2021-01-28 MED ORDER — OXYCODONE HCL 5 MG PO TABS
5.0000 mg | ORAL_TABLET | Freq: Once | ORAL | Status: AC
  Administered 2021-01-28: 5 mg via ORAL

## 2021-01-28 MED ORDER — SODIUM CHLORIDE 0.9% FLUSH
10.0000 mL | Freq: Two times a day (BID) | INTRAVENOUS | Status: DC
  Administered 2021-01-29: 10 mL
  Administered 2021-01-30: 20 mL

## 2021-01-28 MED ORDER — OXYCODONE HCL 5 MG PO TABS
10.0000 mg | ORAL_TABLET | ORAL | Status: DC | PRN
Start: 2021-01-28 — End: 2021-01-31

## 2021-01-28 MED ORDER — OXYCODONE HCL 5 MG PO TABS
15.0000 mg | ORAL_TABLET | ORAL | Status: DC | PRN
  Administered 2021-01-28 – 2021-01-31 (×13): 15 mg via ORAL
  Filled 2021-01-28 (×13): qty 3

## 2021-01-28 MED FILL — Thrombin For Soln 5000 Unit: CUTANEOUS | Qty: 5000 | Status: AC

## 2021-01-28 NOTE — Progress Notes (Signed)
Physical Therapy Treatment Patient Details Name: [name] MRN: [id number] DOB: 04/01/1966 Today's Date: 01/28/2021   History of Present Illness 55 y.o. M admitted on 9/30 for L1-2, L2-3, L3-4, L4-5, L5-S1 laminectomies, transforaminal lumbar interbody fusion. CIR 10/7 -10/14. S/P I&D and placement of wound vac 10/14 d/t wound infection.   PMH significant for DDD, DM2, HTN.    PT Comments    Pt required mod assist sidelying to sit, min assist sit to stand, min assist amb 10' x 2 with RW, and min assist sit to sidelying. Cues required to maintain back precautions. Mobility limited by pain. Pt declining sitting up in recliner stating it is not comfortable. Assisted with positioning in L sidelying upon return to bed.    Recommendations for follow up therapy are one component of a multi-disciplinary discharge planning process, led by the attending physician.  Recommendations may be updated based on patient status, additional functional criteria and insurance authorization.  Follow Up Recommendations  CIR     Equipment Recommendations  Rolling walker with 5" wheels    Recommendations for Other Services       Precautions / Restrictions Precautions Precautions: Fall;Back;Other (comment) Precaution Comments: wound vac Other Brace: no brace needed per order     Mobility  Bed Mobility Overal bed mobility: Needs Assistance Bed Mobility: Sit to Sidelying;Rolling;Sidelying to Sit Rolling: Modified independent (Device/Increase time) Sidelying to sit: Mod assist;HOB elevated     Sit to sidelying: Min assist General bed mobility comments: +rail, increased time, cues for sequencing/logroll    Transfers Overall transfer level: Needs assistance Equipment used: Rolling walker (2 wheeled) Transfers: Sit to/from Stand Sit to Stand: From elevated surface;Min assist            Ambulation/Gait Ambulation/Gait assistance: Min assist Gait Distance (Feet): 10 Feet (x 2) Assistive  device: Rolling walker (2 wheeled) Gait Pattern/deviations: Step-through pattern;Decreased stride length;Trunk flexed Gait velocity: decreased Gait velocity interpretation: <1.31 ft/sec, indicative of household ambulator General Gait Details: heavy reliance on RW. Amb to from bathroom for BM.   Stairs             Wheelchair Mobility    Modified Rankin (Stroke Patients Only)       Balance Overall balance assessment: Needs assistance Sitting-balance support: No upper extremity supported;Feet supported Sitting balance-Leahy Scale: Fair     Standing balance support: Bilateral upper extremity supported;During functional activity Standing balance-Leahy Scale: Poor Standing balance comment: Reliant on B UE support on the RW                            Cognition Arousal/Alertness: Awake/alert Behavior During Therapy: WFL for tasks assessed/performed Overall Cognitive Status: Within Functional Limits for tasks assessed                                        Exercises      General Comments General comments (skin integrity, edema, etc.): wound vac sponge folded over at anterior aspect resulting in bulging ridge of skin. Attention taken when returning to bed to position sponge flat and provide support with pillow for pain relief.      Pertinent Vitals/Pain Pain Assessment: 0-10 Pain Score: 8  Pain Location: back Pain Descriptors / Indicators: Discomfort;Grimacing;Guarding;Cramping Pain Intervention(s): Monitored during session;Repositioned;Limited activity within patient's tolerance;Premedicated before session    Home Living  Prior Function            PT Goals (current goals can now be found in the care plan section) Acute Rehab PT Goals Patient Stated Goal: go back to rehab Progress towards PT goals: Progressing toward goals    Frequency    Min 5X/week      PT Plan Current plan remains appropriate     Co-evaluation              AM-PAC PT "6 Clicks" Mobility   Outcome Measure  Help needed turning from your back to your side while in a flat bed without using bedrails?: A Little Help needed moving from lying on your back to sitting on the side of a flat bed without using bedrails?: A Lot Help needed moving to and from a bed to a chair (including a wheelchair)?: A Little Help needed standing up from a chair using your arms (e.g., wheelchair or bedside chair)?: A Little Help needed to walk in hospital room?: A Little Help needed climbing 3-5 steps with a railing? : Total 6 Click Score: 15    End of Session Equipment Utilized During Treatment: Gait belt Activity Tolerance: Patient tolerated treatment well Patient left: in bed;with call bell/phone within reach;with bed alarm set Nurse Communication: Mobility status PT Visit Diagnosis: Unsteadiness on feet (R26.81);Pain     Time: 2863-8177 PT Time Calculation (min) (ACUTE ONLY): 25 min  Charges:  $Gait Training: 23-37 mins                     Lorrin Goodell, [place]  Office # [phone] Pager [phone]    Lorriane Shire 01/28/2021, 8:24 AM

## 2021-01-28 NOTE — PMR Pre-admission (Signed)
PMR Admission Coordinator Pre-Admission Assessment  Patient: [name] is an 55 y.o., male MRN: [id number] DOB: 1966/01/01 Height:   Weight:    Insurance Information HMO:     PPO:      PCP:      IPA:      80/20:      OTHER:  PRIMARY: [employer]      Policy#: [id number]      Subscriber: pt CM Name: Jackelyn Poling   Phone#: [phone]     Fax#: [phone] or e-mail [email or web address] Pre-Cert#: [id number]   approved for 7 days   Employer:  Benefits:  Phone #: [phone]     Name: 10/6 Eff. Date: 04/14/2020     Deduct: none      Out of Pocket Max: $5000      Life Max: none CIR: $250 co pay per admit then insurance covers 80%      SNF: $250 co pay per admit, then insurance covers 80% for 100 days Outpatient: first 15 visits no charge, then 16 until 100 80%     Co-Pay:  Home Health: 80 %      Co-Pay: 100 visits combined DME: 80%     Co-Pay: 20% Providers: in network  SECONDARY: none        Financial Counselor:       Phone#:   [employer]" for patients in Inpatient Rehabilitation Facilities with attached "Privacy Act [place] Records" was provided and verbally reviewed with: N/A  Emergency Contact Information Contact Information     Name Relation Home Work [place] [phone]  [phone]   [place], Day   [phone]       Current Medical History  Patient Admitting Diagnosis: Lumbar radiculopathy  History: 55 y.o. male with history of DVT, T2DM, OSA, obesity, lumbar stenosis with RLE radiculopathy pain limiting functional ability 6 months.  He was found to have lumbar cyst with severe canal stenosis from L2/L3 to L4/L5 and was admitted on 01/11/2021 for L1-S1 laminotomies with PLIF by Dr. Venetia Constable.  Postop noted to have elevated CK at 2 2972 with lactic acidosis, low mag levels, intermittent tachycardia with heart rates up to 120s as well as acute blood loss anemia and hyponatremia.  Hyponatremia  felt to be dilutional from fluid boluses which are required for episodic hypotension.  CK was trending downward to 1970 and hyponatremia resolved improving with sodium up to 131.  He had issues with urinary retention requiring Foley placement.  Has also had constipation , poor appetite, bleeding from incision, neuropathy and spasticity as well as orthostatic symptoms affect him mobility and ADLs.     He was admitted to rehab 01/18/2021 . He was noted to have bogginess as well as drainage from superior aspect of wound and therefore added on doxycycline for wound prophylaxis.     He was also noted to have issues with hypotension as well as AKI and was treated with IV fluids briefly.  Hospital course was significant for ongoing issues with wound drainage as well as low-grade fevers.  He did develop hypotension with tachycardia as well as rising temp to 100.4 on 10/10 due to SIRS.  He was started on broad-spectrum antibiotics and was pan cultured.   UA/urine culture was negative for infection but 2/2 blood cultures grew Kleb pneumonia.  Neurosurgery was consulted for input and recommended local care for management of back incision and did not feel that back was a source of infection.  Antibiotics were narrowed to ceftriaxone and ID was consulted for input.  Dr. Cheri Guppy recommended continuing ceftriaxone as well as imaging for further work-up.  CT of abdomen pelvis was done due to ongoing issues with abdominal discomfort and was negative for acute changes but did show enlarged spleen.  some time in the past.  Lumbar spine CT done due to ongoing back drainage and showed fluid collection in the soft tissues and musculature posterior to L1/S1 posterior fixation most likely postop seroma and soft tissue component of collection extending from T10-S1 without significant enhancement to suggest abscess.  Neurosurgery was contacted again for input and recommended taking patient to the OR for cleanout of wound and no  further intervention. He was discharged to acute hospital on 01/25/21.  Taken to OR on 01/25/2021 by Dr Dawley for washout and wound VAC placement. Postop pain meds for better pain control. PICC to be placed for long term antibiotics of Cefazolin for 6 weeks with stop date of 11/25. Wound VAC changes MWF.  Patient[facility] medical record from [facility] has been reviewed by the rehabilitation admission coordinator and physician.  Past Medical History  Past Medical History:  Diagnosis Date   DDD (degenerative disc disease), lumbar    Diabetes mellitus    Hyperlipemia    Hypertension    Obesity    OSA (obstructive sleep apnea)    on CPAP   Has the patient had major surgery during 100 days prior to admission? Yes  Family History   family history includes Diabetes in his mother.  Current Medications  Current Facility-Administered Medications:    0.9 %  sodium chloride infusion, 250 mL, Intravenous, Continuous, Dawley, Troy C, DO   acetaminophen (TYLENOL) tablet 650 mg, 650 mg, Oral, Q4H PRN, 650 mg at 01/31/21 0438 **OR** acetaminophen (TYLENOL) suppository 650 mg, 650 mg, Rectal, Q4H PRN, Dawley, Troy C, DO   alum & mag hydroxide-simeth (MAALOX/MYLANTA) 200-200-20 MG/5ML suspension 30 mL, 30 mL, Oral, Q6H PRN, Reinaldo Meeker, Meghan D, NP, 30 mL at 01/30/21 1716   ceFAZolin (ANCEF) IVPB 2g/100 mL premix, 2 g, Intravenous, Q8H, Manandhar, Collene Mares, MD, Stopped at 01/31/21 0509   Chlorhexidine Gluconate Cloth 2 % PADS 6 each, 6 each, Topical, Daily, Judith Part, MD, 6 each at 01/30/21 1000   gabapentin (NEURONTIN) tablet 600 mg, 600 mg, Oral, TID, Dawley, Troy C, DO, 600 mg at 01/31/21 0851   heparin injection 5,000 Units, 5,000 Units, Subcutaneous, Q8H, Ostergard, Joyice Faster, MD, 5,000 Units at 01/31/21 0440   hydrochlorothiazide (MICROZIDE) capsule 12.5 mg, 12.5 mg, Oral, Daily, Paytes, Austin A, RPH, 12.5 mg at 01/31/21 0851   HYDROmorphone (DILAUDID) injection 1 mg, 1 mg, Intravenous,  Q3H PRN, Judith Part, MD, 1 mg at 01/30/21 1945   influenza vac split quadrivalent PF (FLUARIX) injection 0.5 mL, 0.5 mL, Intramuscular, Tomorrow-1000, Ostergard, Joyice Faster, MD   losartan (COZAAR) tablet 100 mg, 100 mg, Oral, Daily, Paytes, Austin A, RPH, 100 mg at 01/31/21 6333   menthol-cetylpyridinium (CEPACOL) lozenge 3 mg, 1 lozenge, Oral, PRN **OR** phenol (CHLORASEPTIC) mouth spray 1 spray, 1 spray, Mouth/Throat, PRN, Dawley, Troy C, DO   metFORMIN (GLUCOPHAGE) tablet 1,000 mg, 1,000 mg, Oral, BID WC, Dawley, Troy C, DO, 1,000 mg at 01/31/21 5456   Muscle Rub CREA, , Topical, PRN, Newman Pies, MD   ondansetron [facility]) tablet 4 mg, 4 mg, Oral, Q6H PRN **OR** ondansetron (ZOFRAN) injection 4 mg, 4 mg, Intravenous, Q6H PRN, Dawley, Troy C, DO   oxyCODONE (Oxy IR/ROXICODONE)  immediate release tablet 10 mg, 10 mg, Oral, Q4H PRN, Judith Part, MD   oxyCODONE (Oxy IR/ROXICODONE) immediate release tablet 15 mg, 15 mg, Oral, Q4H PRN, Judith Part, MD, 15 mg at 01/31/21 0850   rosuvastatin (CRESTOR) tablet 20 mg, 20 mg, Oral, Daily, Dawley, Troy C, DO, 20 mg at 01/31/21 0851   senna (SENOKOT) tablet 8.6 mg, 1 tablet, Oral, BID, Dawley, Troy C, DO, 8.6 mg at 01/31/21 0850   sodium chloride flush (NS) 0.9 % injection 10-40 mL, 10-40 mL, Intracatheter, Q12H, Ostergard, Joyice Faster, MD, 20 mL at 01/30/21 2134   sodium chloride flush (NS) 0.9 % injection 10-40 mL, 10-40 mL, Intracatheter, PRN, Judith Part, MD   sodium chloride flush (NS) 0.9 % injection 3 mL, 3 mL, Intravenous, Q12H, Dawley, Troy C, DO, 3 mL at 01/29/21 1053   sodium chloride flush (NS) 0.9 % injection 3 mL, 3 mL, Intravenous, PRN, Dawley, Troy C, DO   tamsulosin (FLOMAX) capsule 0.4 mg, 0.4 mg, Oral, Daily, Newman Pies, MD, 0.4 mg at 01/31/21 0851   tiZANidine (ZANAFLEX) tablet 4 mg, 4 mg, Oral, Q6H PRN, Dawley, Troy C, DO, 4 mg at 01/31/21 1700  Patients Current Diet:  Diet Order             Diet  regular Room service appropriate? Yes; Fluid consistency: Thin  Diet effective now                  Precautions / Restrictions Precautions Precautions: Fall, Back, Other (comment) Precaution Booklet Issued: No Precaution Comments: wound vac Other Brace: no brace needed per order Restrictions Weight Bearing Restrictions: No   Has the patient had 2 or more falls or a fall with injury in the past year? No  Prior Activity Level Community (5-7x/wk): Independent, not worked since May  Prior Functional Level Self Care: Did the patient need help bathing, dressing, using the toilet or eating? Independent  Indoor Mobility: Did the patient need assistance with walking from room to room (with or without device)? Independent  Stairs: Did the patient need assistance with internal or external stairs (with or without device)? Independent  Functional Cognition: Did the patient need help planning regular tasks such as shopping or remembering to take medications? Independent  Patient Information Are you of Hispanic, Latino/a,or Spanish origin?: A. No, not of Hispanic, Latino/a, or Spanish origin What is your race?: A. White Do you need or want an interpreter to communicate with a doctor or health care staff?: 0. No  Patient's Response To:  Health Literacy and Transportation Is the patient able to respond to health literacy and transportation needs?: Yes Health Literacy - How often do you need to have someone help you when you read instructions, pamphlets, or other written material from your doctor or pharmacy?: Never In the past 12 months, has lack of transportation kept you from medical appointments or from getting medications?: No In the past 12 months, has lack of transportation kept you from meetings, work, or from getting things needed for daily living?: No  Home Assistive Devices / [place] Devices/Equipment: Blood pressure cuff, CBG Meter, Eyeglasses  Prior Device  Use: Indicate devices/aids used by the patient prior to current illness, exacerbation or injury? None of the above  Current Functional Level Cognition  Overall Cognitive Status: Within Functional Limits for tasks assessed Orientation Level: Oriented X4    Extremity Assessment (includes Sensation/Coordination)  Upper Extremity Assessment: Generalized weakness  Lower Extremity Assessment: Defer to PT  evaluation    ADLs  Overall ADL's : Needs assistance/impaired Grooming: Wash/dry hands, Wash/dry face, Oral care, Set up, Sitting Upper Body Bathing: Set up, Sitting Lower Body Bathing: Maximal assistance, Sit to/from stand Upper Body Dressing : Set up, Sitting Lower Body Dressing: Maximal assistance, Sit to/from stand Toilet Transfer: Min guard, RW, Ambulation Toileting- Clothing Manipulation and Hygiene: Maximal assistance, Sit to/from stand Functional mobility during ADLs: Rolling walker, Cueing for safety, Min guard General ADL Comments: pt limtied by pain, weakness, and balance    Mobility  Overal bed mobility: Needs Assistance Bed Mobility: Rolling, Sidelying to Sit, Sit to Sidelying Rolling: Supervision Sidelying to sit: Supervision Sit to sidelying: Min assist General bed mobility comments: minA for bringing LEs back into bed. Use of rail    Transfers  Overall transfer level: Needs assistance Equipment used: Rolling walker (2 wheeled) Transfers: Sit to/from Stand Sit to Stand: Min assist, From elevated surface Stand pivot transfers: Min guard General transfer comment: minA from very elevated surface    Ambulation / Gait / Stairs / Wheelchair Mobility  Ambulation/Gait Ambulation/Gait assistance: Herbalist (Feet): 40 Feet Assistive device: Rolling walker (2 wheeled) Gait Pattern/deviations: Step-through pattern, Decreased stride length, Trunk flexed General Gait Details: heavy reliance on RW. MinA for balance and RW management. Cues for upright posture  throughout Gait velocity: decreased Gait velocity interpretation: <1.31 ft/sec, indicative of household ambulator    Posture / Balance Balance Overall balance assessment: Needs assistance Sitting-balance support: No upper extremity supported, Feet supported Sitting balance-Leahy Scale: Fair Standing balance support: Bilateral upper extremity supported, During functional activity Standing balance-Leahy Scale: Poor Standing balance comment: Reliant on B UE support on the RW    Special needs/care consideration Home CPAP Wound VAC new this admit To be on 6 weeks IV antibiotics via PICC stop date 03/08/21   Previous Home Environment  Living Arrangements: Spouse/significant other  Lives With: Spouse, Daughter Available Help at Discharge: Family, Available 24 hours/day Type of Home: House Home Layout: One level Home Access: Stairs to enter Entrance Stairs-Rails: None Entrance Stairs-Number of Steps: 3 Bathroom Shower/Tub: Chiropodist: Handicapped height Bathroom Accessibility: Yes How Accessible: Accessible via walker [place]: No  Discharge Living Setting Plans for Discharge Living Setting: Patient's home, Lives with (comment) Type of Home at Discharge: House Discharge Home Layout: One level Discharge Home Access: Stairs to enter Entrance Stairs-Rails: None Entrance Stairs-Number of Steps: 3 Discharge Bathroom Shower/Tub: Tub/shower unit Discharge Bathroom Toilet: Handicapped height Discharge Bathroom Accessibility: Yes How Accessible: Accessible via walker Does the patient have any problems obtaining your medications?: No  Social/Family/Support Systems Patient Roles: Spouse, Parent Contact Information: wife, [name] Anticipated Caregiver: Pt brother lives in the home Anticipated Caregiver's Contact Information: see above Ability/Limitations of Caregiver: Pt wife works during the day; brother will provide supervision during the day Caregiver  Availability: 24/7 Discharge Plan Discussed with Primary Caregiver: Yes Is Caregiver In Agreement with Plan?: Yes Does Caregiver/Family have Issues with Lodging/Transportation while Pt is in Rehab?: No  Goals Patient/Family Goal for Rehab: Mod I to supervision with PT and OT Expected length of stay: ELOS 10 to 14 days Pt/Family Agrees to Admission and willing to participate: Yes Program Orientation Provided & Reviewed with Pt/Caregiver Including Roles  & Responsibilities: Yes  Decrease burden of Care through IP rehab admission: n/a  Possible need for SNF placement upon discharge: not anticipated  Patient Condition: I have reviewed medical records from [facility], spoken with CM, and patient and  spouse. I met with patient at the bedside for inpatient rehabilitation assessment.  Patient will benefit from ongoing PT and OT, can actively participate in 3 hours of therapy a day 5 days of the week, and can make measurable gains during the admission.  Patient will also benefit from the coordinated team approach during an Inpatient Acute Rehabilitation admission.  The patient will receive intensive therapy as well as Rehabilitation physician, nursing, social worker, and care management interventions.  Due to bladder management, bowel management, safety, skin/wound care, disease management, medication administration, pain management, and patient education the patient requires 24 hour a day rehabilitation nursing.  The patient is currently mod assist overall with mobility and basic ADLs.  Discharge setting and therapy post discharge at home with home health is anticipated.  Patient has agreed to participate in the Acute Inpatient Rehabilitation Program and will admit today.  Preadmission Screen Completed By:  Danne Baxter RN MSN with updates by Julious Payer, Audelia Acton, 01/31/2021 10:38 AM ______________________________________________________________________   Discussed status with Dr. Dagoberto Ligas  on 01/31/21 at 1038 and received approval for admission today.  Admission Coordinator: Danne Baxter RN MSN  time 6222 Date  01/31/21   Assessment/Plan: Diagnosis: Does the need for close, 24 hr/day Medical supervision in concert with the patient's rehab needs make it unreasonable for this patient to be served in a less intensive setting? Yes Co-Morbidities requiring supervision/potential complications: lumbar stenosis s/p decompression/fusion with bacteremia s/p back wash out- VAC placed- post op pain; orthostatic hypotension; urinary retention Due to bladder management, bowel management, safety, skin/wound care, disease management, medication administration, pain management, and patient education, does the patient require 24 hr/day rehab nursing? Yes Does the patient require coordinated care of a physician, rehab nurse, PT, OT, and SLP to address physical and functional deficits in the context of the above medical diagnosis(es)? Yes Addressing deficits in the following areas: balance, endurance, locomotion, strength, transferring, bowel/bladder control, bathing, dressing, feeding, grooming, and toileting Can the patient actively participate in an intensive therapy program of at least 3 hrs of therapy 5 days a week? Yes The potential for patient to make measurable gains while on inpatient rehab is good Anticipated functional outcomes upon discharge from inpatient rehab: modified independent and supervision PT, modified independent and supervision OT, n/a SLP Estimated rehab length of stay to reach the above functional goals is: 10-14 days Anticipated discharge destination: Home 10. Overall Rehab/Functional Prognosis: good   MD Signature:

## 2021-01-28 NOTE — Progress Notes (Signed)
Physical Therapy Note  Patient Details  Name: [name] MRN: [id number] Date of Birth: 08-31-1965 Today's Date: 01/28/2021    Physical Therapy Discharge Note  This patient was unable to complete the inpatient rehab program due to d/c back to acute care for I&D of surgical site; therefore did not meet their long term goals. Pt left the program at a mod assist level for their functional mobility/ transfers. This patient is being discharged from PT services at this time.  Pt's perception of pain in the last five days was unable to answer at this time.    See CareTool for functional status details  If the patient is able to return to inpatient rehabilitation within 3 midnights, this may be considered an interrupted stay and therapy services will resume as ordered. Modification and reinstatement of their goals will be made upon completion of therapy service reevaluations.     Excell Seltzer, PT, DPT, CSRS  01/28/2021, 5:39 PM

## 2021-01-28 NOTE — H&P (Signed)
Physical Medicine and Rehabilitation Admission H&P    CC: Functional deficits due to lumbar radiculopathy/myelopathy.    HPI: [name] is a 55 year old male with history of T2DM, OSA, obesity. DDD with cervical and lumbar stenosis with RLE radiculopathy and significant pain limiting functional status since May 2022. He was found to have lumbar spondylosis with severe canal stenosis from L1-L2  to L4-L5.  He was admitted on 01/12/2019 for L1-S1 laminectomy with PLIF by Dr. Venetia Constable.  Hospital course was significant for issues with hypotension as well as lethargy, elevated CK with lactic acidosis, intermittent tachycardia as well as acute blood loss anemia with hyponatremia as well as drainage from back incision.  He was treated with IV fluids and hyponatremia felt to be dilutional due to fluid boluses.  His blood pressures were improving however he continued to be limited by orthostatic symptoms as well as pain with activity and poor p.o. intake.  Foley was placed due to urinary retention.he continued to be limited by pain, neuropathy, spasms BLE as well as weakness affecting mobility and ADLs. CIR was recommended due to functional decline.    He was admitted to inpatient rehab on 01/18/2021 for comprehensive intensive rehab.  He continued to have issues with abdominal and back pain with poor p.o. intake as well as ongoing serosanguineous drainage from superior aspect of wound.  He was started on doxycycline for wound prophylaxis and multiple supplements were added to help promote wound healing.  He had recurrent drop in sodium to 128 with AKI and was treated with IV fluids.  He developed hypotension with tachycardia and low-grade fevers due to SIRS on 10/10.  He was started on broad-spectrum antibiotics and pancultured with blood culture showing Kleb pneumonia.  Antibiotics narrowed to ceftriaxone per ID input.  2D echo was done and was negative for endocarditis.  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    Lumbar spine CT was done due to ongoing back drainage and showed fluid collection soft tissues and musculature posterior to L1/S1 felt to be postop seroma as well as soft tissue fluid collection extending from T10-S1.  Neurosurgery was contacted for input patient was discharged to acute hospital for wound washout and cultures. No purulence noted and wound culture was positive for Klebsiella. Dr. West Bali recommends 6 weeks IV cefazolin from OR date of 10/14 and to follow up on outpatient basis. Back pain is improving but patient continues to be limited by weakness with poor posture and fatigue affecting functional status. Therapy ongoing and he was cleared to resume CIR.  Pt reports LBM this AM- they put foley back in- because not voiding.  Pain is in low right back- lateral to incision- aching and throbbing; makes him sick to his stomach, esp when gets up.   Pain getting a little better- keeps moving to keep wound VAC from hurting so much.    Review of Systems  Constitutional:  Positive for malaise/fatigue. Negative for fever.  HENT: Negative.    Eyes: Negative.   Respiratory: Negative.  Negative for cough and shortness of breath.   Cardiovascular: Negative.   Gastrointestinal:  Negative for constipation, diarrhea and nausea.  Genitourinary:        Urinary retention  Musculoskeletal:  Positive for back pain and myalgias.  Skin:  Negative for itching and rash.  Neurological:  Positive for tingling, sensory change and weakness.  Endo/Heme/Allergies: Negative.   Psychiatric/Behavioral:  Negative for hallucinations. The  patient has insomnia.     Past Surgical History:  Procedure Laterality Date   APPLICATION OF ROBOTIC ASSISTANCE FOR SPINAL PROCEDURE N/A 01/11/2021   Procedure: APPLICATION OF ROBOTIC ASSISTANCE FOR SPINAL PROCEDURE;  Surgeon: Judith Part, MD;  Location: [facility];  Service: Neurosurgery;  Laterality: N/A;   BACK SURGERY      COLONOSCOPY  2019   TRANSFORAMINAL LUMBAR INTERBODY FUSION (TLIF) WITH PEDICLE SCREW FIXATION 4 LEVEL N/A 01/11/2021   Procedure: Lumbar one-two, Lumbar two-three, Lumbar three-four, Lumbar four-five, Lumbar five Sacral one Open decompression, Transforaminal lumbar interbody fusion, posterolateral instrumented fusion;  Surgeon: Judith Part, MD;  Location: [facility];  Service: Neurosurgery;  Laterality: N/A;    Family History  Problem Relation Age of Onset   Diabetes Mother    Hypertension Neg Hx    Coronary artery disease Neg Hx    Colon cancer Neg Hx    Prostate cancer Neg Hx     Social History:  Married. Was independent and working prior to May 2022--has been limited by back pain/spasms. He reports that he has never smoked. He has never used smokeless tobacco. He reports that he does not drink alcohol and does not use drugs.   Allergies  Allergen Reactions   Cymbalta [Duloxetine Hcl] Other (See Comments)   Penicillins Other (See Comments)    REACTION: Blister and Sores in the mouth    Medications Prior to Admission  Medication Sig Dispense Refill   aspirin 81 MG tablet Take 81 mg by mouth daily.       blood glucose meter kit and supplies Use up to four times daily as directed. Dx: E11.9 1 each 0   Blood Glucose Monitoring Suppl ([place]) w/Device KIT Test blood sugar twice a day.  Dx code: E11.9 1 kit 0   fluticasone (FLONASE) 50 MCG/ACT nasal spray Place 2 sprays into both nostrils daily. (Patient taking differently: Place 2 sprays into both nostrils 2 (two) times daily.) 16 g 5   gabapentin (NEURONTIN) 600 MG tablet Take 1 tablet (600 mg total) by mouth 3 (three) times daily. 90 tablet 5   glucose blood (ONETOUCH VERIO) test strip Check blood sugars twice daily 200 strip 12   losartan-hydrochlorothiazide (HYZAAR) 100-12.5 MG tablet TAKE 1 TABLET BY MOUTH EVERY DAY 90 tablet 1   metFORMIN (GLUCOPHAGE) 1000 MG tablet Take 1 tablet (1,000 mg total) by mouth 2  (two) times daily with a meal. 180 tablet 1   NALTREXONE HCL PO Take 4 mg by mouth daily.     OneTouch Delica Lancets 58I MISC Test blood sugar twice a day.  Dx code: E11.9 200 each 12   oxyCODONE 10 MG TABS Take 1 tablet (10 mg total) by mouth every 4 (four) hours as needed for severe pain ((score 7 to 10)). 30 tablet 0   rosuvastatin (CRESTOR) 20 MG tablet Take 1 tablet (20 mg total) by mouth daily. 90 tablet 3   tadalafil (CIALIS) 20 MG tablet TAKE 1/2 TO 1 TABLET BY MOUTH DAILY AS NEEDED FOR ERECTILE DYSFUNCTION 30 tablet 3   tiZANidine (ZANAFLEX) 4 MG tablet Take 1 tablet (4 mg total) by mouth every 6 (six) hours as needed for muscle spasms. 120 tablet 5    Drug Regimen Review  Drug regimen was reviewed and remains appropriate with no significant issues identified  Home: Home Living Family/patient expects to be discharged to:: Inpatient rehab Living Arrangements: Spouse/significant other Available Help at Discharge: Family, Available 24 hours/day Type  of Home: House Home Access: Stairs to enter [employer] of Steps: 3 Entrance Stairs-Rails: None Home Layout: One level Bathroom Shower/Tub: Chiropodist: Handicapped height Bathroom Accessibility: Yes  Lives With: Spouse, Daughter   Functional History: Prior Function Level of Independence: Needs assistance Gait / Transfers Assistance Needed: reports ambulating 136' at CIR with assistance and RW ADL's / Homemaking Assistance Needed: needing assist for LB ADLs in rehab Comments: Prior to initial surgery, patient was independent but not working due to pain  Functional Status:  Mobility: Bed Mobility Overal bed mobility: Needs Assistance Bed Mobility: Rolling, Sidelying to Sit, Sit to Sidelying Rolling: Supervision Sidelying to sit: Supervision Sit to sidelying: Min assist General bed mobility comments: minA for bringing LEs back into bed. Use of rail Transfers Overall transfer level: Needs  assistance Equipment used: Rolling walker (2 wheeled) Transfers: Sit to/from Stand Sit to Stand: Min assist, From elevated surface Stand pivot transfers: Min guard General transfer comment: minA from very elevated surface Ambulation/Gait Ambulation/Gait assistance: Min assist Gait Distance (Feet): 40 Feet Assistive device: Rolling walker (2 wheeled) Gait Pattern/deviations: Step-through pattern, Decreased stride length, Trunk flexed General Gait Details: heavy reliance on RW. MinA for balance and RW management. Cues for upright posture throughout Gait velocity: decreased Gait velocity interpretation: <1.31 ft/sec, indicative of household ambulator    ADL: ADL Overall ADL's : Needs assistance/impaired Grooming: Wash/dry hands, Wash/dry face, Oral care, Set up, Sitting Upper Body Bathing: Set up, Sitting Lower Body Bathing: Maximal assistance, Sit to/from stand Upper Body Dressing : Set up, Sitting Lower Body Dressing: Maximal assistance, Sit to/from stand Toilet Transfer: Min guard, RW, Ambulation Toileting- Clothing Manipulation and Hygiene: Maximal assistance, Sit to/from stand Functional mobility during ADLs: Rolling walker, Cueing for safety, Min guard General ADL Comments: pt limtied by pain, weakness, and balance  Cognition: Cognition Overall Cognitive Status: Within Functional Limits for tasks assessed Orientation Level: Oriented X4 Cognition Arousal/Alertness: Awake/alert Behavior During Therapy: WFL for tasks assessed/performed Overall Cognitive Status: Within Functional Limits for tasks assessed   Blood pressure 129/84, pulse (!) 105, temperature 98.5 F (36.9 C), resp. rate 18, SpO2 98 %. Physical Exam Vitals and nursing note reviewed.  Constitutional:      General: He is not in acute distress.    Appearance: Normal appearance. He is obese.     Comments: Pale appearing. NAD Pt is an obese man; tall man; supine in bed; NAD  HENT:     Head: Normocephalic and  atraumatic.     Right Ear: External ear normal.     Left Ear: External ear normal.     Nose: Nose normal. No congestion.     Mouth/Throat:     Mouth: Mucous membranes are dry.     Pharynx: Oropharynx is clear. No oropharyngeal exudate.  Eyes:     General:        Right eye: No discharge.        Left eye: No discharge.     Extraocular Movements: Extraocular movements intact.  Cardiovascular:     Rate and Rhythm: Regular rhythm. Tachycardia present.     Heart sounds: Normal heart sounds. No murmur heard.   No gallop.  Pulmonary:     Comments: CTA B/L- no W/R/R- good air movement Abdominal:     Tenderness: There is no abdominal tenderness.     Comments: Soft, NT, ND, (+)BS  - hypoactive  Genitourinary:    Comments: Foley in place- medium amber urine in bag Musculoskeletal:  Cervical back: Normal range of motion.     Comments: Pt has no muscle atrophy- UE strength 5/5 B/L  LE- HF 4/5; KE/KF 4+/5; Df/PF 4+/5  Skin:    General: Skin is warm and dry.     Comments: Wound VAC on back incision- less puffy when seen last- sanguinous drainag ein VAC container.  PICC in RUE  Neurological:     Mental Status: He is alert and oriented to person, place, and time.     Comments: Intact to light touch in all 4 extremities  Psychiatric:     Comments: Flat but appropriate    No results found for this or any previous visit (from the past 48 hour(s)).  No results found.     Medical Problem List and Plan: 1.  Lumbar radiculopathy/myelopathy secondary to nerve root compression- s/p decompression and wash out after bacteremia from klebsiella pneumonia  -patient may not shower til wound VAC removed  -ELOS/Goals: 10-14 days mod I to supervision 2.  Antithrombotics: -DVT/anticoagulation:  Pharmaceutical: Lovenox  -antiplatelet therapy: N/A 3. Pain Management:  Continue Oxycodone 15 mg prn. -- Will resume MSContin for more consistent pain relief. Schedule Tizanidine TID. Resume muscle rug  to right thigh as it was helping before.   --Change IV dilaudid to prior to VAC changes.   4. Mood: Team to provide ego support. LCSW to follow for evaluation and support.   -antipsychotic agents: N/A 5. Neuropsych: This patient is capable of making decisions on his own behalf. 6. Skin/Wound Care:  Monitor wound daily. Will add vitamins and protein supplement to help promote wound healing.  7. Fluids/Electrolytes/Nutrition: Monitor I/O. Check CMET in am.  8. Klebsiella bacteremia w/diskitis: On Cefazolin 2 g every 8 hrs with end date 03/11/21  --CRP down to 7.9 and procalcitonin down to 80.59 -- Check weekly CRP/ESR.  9. HTN: Monitor BP tid--continue Microzide and cozaar daily. 10. T2DM: Hgb A1c-  CM diet. On Metformin 1000 mg bid 11. Neurogenic bladder: On flomax. Will increase to 0.8 mg nightly-  13. Neuropathy: On Neurontin 600 mg TID.  14. ABLA: monitor for signs of bleeding. Hgb stabilizing to 8 range.  --Will continue to monitor H/H for stability.  15. 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 -- Follow up with surgery after discharge.  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  17. Abdominal pain: Much better--dysesthesias?  18. Hypomagnesemia: Resume Mag Ox. Check Mg level in am.    I have personally performed a face to face diagnostic evaluation of this patient and formulated the key components of the plan.  Additionally, I have personally reviewed laboratory data, imaging studies, as well as relevant notes and concur with the physician assistant's documentation above.   The patient's status has not changed from the original H&P.  Any changes in documentation from the acute care chart have been noted above.     Bary Leriche, PA-C 01/31/2021

## 2021-01-28 NOTE — Progress Notes (Signed)
PHARMACY CONSULT NOTE FOR:  OUTPATIENT  PARENTERAL ANTIBIOTIC THERAPY (OPAT)  Indication: Klebsiella bacteremia/lumbar wound infection  Regimen: Cefazolin 2 gm IV Q 8 hours  End date: 03/11/2021  IV antibiotic discharge orders are pended. To discharging provider:  please sign these orders via discharge navigator,  Select New Orders & click on the button choice - Manage This Unsigned Work.     Thank you for allowing pharmacy to be a part of this patient's care.  Jimmy Footman, PharmD, BCPS, BCIDP Infectious Diseases Clinical Pharmacist Phone: [phone] 01/28/2021, 9:44 AM

## 2021-01-28 NOTE — H&P (Signed)
Neurosurgery H&P  CC: Wound drainage  HPI: This is a 55 y.o. man in whom I previously performed a large open lumbar decompression and multi-level TLIF/PLF. He was doing well in CIR but developed wound drainage with fevers and positive blood cultures without another clear source. He was taken to the OR by my partner, Dr. Reatha Armour, while I was out of town for wound vac placement and washout. Today he's doing well, just had a BM, having soreness around the wound vac - especially since the foam was changed this morning. No radicular pain / numbness.   ROS: A 14 point ROS was performed and is negative except as noted in the HPI.   PMHx:  Past Medical History:  Diagnosis Date   DDD (degenerative disc disease), lumbar    Diabetes mellitus    Hyperlipemia    Hypertension    Obesity    OSA (obstructive sleep apnea)    on CPAP   FamHx:  Family History  Problem Relation Age of Onset   Diabetes Mother    Hypertension Neg Hx    Coronary artery disease Neg Hx    Colon cancer Neg Hx    Prostate cancer Neg Hx    SocHx:  reports that he has never smoked. He has never used smokeless tobacco. He reports that he does not drink alcohol and does not use drugs.  Exam: Vital signs in last 24 hours: Temp:  [98.4 F (36.9 C)-99.1 F (37.3 C)] 98.4 F (36.9 C) (10/17 1111) Pulse Rate:  [99-107] 101 (10/17 1111) Resp:  [15-20] 20 (10/17 1111) BP: (133-143)/(69-87) 137/87 (10/17 1111) SpO2:  [94 %-100 %] 96 % (10/17 1111) General: Awake, alert, cooperative, lying in bed in NAD Head: Normocephalic and atruamatic HEENT: Neck supple Pulmonary: breathing room air comfortably, no evidence of increased work of breathing Cardiac: RRR Abdomen: S NT ND Extremities: Warm and well perfused x4 Neuro: AOx3, PERRL, EOMI, FS Strength 5/5 x4, SILTx4 Incision c/d/I with wound vac in place with good seal, no erythema/induration   Assessment and Plan: 55 y.o. man s/p L1-S1 decompression and instrumented fusion  with post-op suspected SSI, atypical organism (K. Pneumoniae) but certainly has been described.   -will increase oxycodone dose for better pain control, morphine --> hydromorphone -PICC today, repeat Bcx have been negative -ID recs - K pneumoniae cef sensitive, plan for 6 weeks, stop date of 11/25 -cont wound vac, changes MWF  Judith Part, MD 01/28/21 11:25 AM [facility]

## 2021-01-28 NOTE — Plan of Care (Signed)
Planned discharge to acute hospital for surgery.

## 2021-01-28 NOTE — Progress Notes (Addendum)
Inpatient Rehabilitation Admissions Coordinator   I met with patient at bedside. I await further surgical plans for his wound. We have begun insurance Auth with commercial [employer] for readmit to CIR when medically ready.  Danne Baxter, RN, MSN Rehab Admissions Coordinator [phone] 01/28/2021 10:28 AM   I contacted Dr Joaquim Nam phone to clarify surgical plans.   Danne Baxter, RN, MSN Rehab Admissions Coordinator [phone] 01/28/2021 12:26 PM

## 2021-01-28 NOTE — Progress Notes (Signed)
   Occupational Therapy Discharge Note  This patient was unable to complete the inpatient rehab program due to patient transferred back to acute for further surgical intervention and I&D of surgical site; therefore did not meet their long term goals. Pt left the program at a Min/CGA level for transfers and Max/total assist level for their  functional ADLs, primarily for LB self care. Pt severely limited by back pain during ADL tasks. This patient is being discharged from OT services at this time.  BIMS at time of d/c  Pt unable to complete due to medical status  See CareTool for functional status details.  If the patient is able to return to inpatient rehabilitation within 3 midnights, this may be considered an interrupted stay and therapy services will resume as ordered. Modification and reinstatement of their goals will be made upon completion of therapy service reevaluations.

## 2021-01-28 NOTE — Progress Notes (Signed)
Peripherally Inserted Central Catheter Placement  The IV Nurse has discussed with the patient and/or persons authorized to consent for the patient, the purpose of this procedure and the potential benefits and risks involved with this procedure.  The benefits include less needle sticks, lab draws from the catheter, and the patient may be discharged home with the catheter. Risks include, but not limited to, infection, bleeding, blood clot (thrombus formation), and puncture of an artery; nerve damage and irregular heartbeat and possibility to perform a PICC exchange if needed/ordered by physician.  Alternatives to this procedure were also discussed.  Bard Power PICC patient education guide, fact sheet on infection prevention and patient information card has been provided to patient /or left at bedside.    PICC Placement Documentation  PICC Single Lumen 43/83/77 Right Basilic 43 cm 0 cm (Active)  Indication for Insertion or Continuance of Line Prolonged intravenous therapies 01/28/21 1706  Exposed Catheter (cm) 0 cm 01/28/21 1706  Site Assessment Clean;Dry;Intact 01/28/21 1706  Line Status Flushed;Blood return noted;Saline locked 01/28/21 1706  Dressing Type Transparent 01/28/21 1706  Dressing Status Clean;Dry;Intact 01/28/21 1706  Antimicrobial disc in place? Yes 01/28/21 1706  Dressing Change Due 02/04/21 01/28/21 1706       Scotty Court 01/28/2021, 5:09 PM

## 2021-01-28 NOTE — Progress Notes (Addendum)
I have seen and examined the patient. I have personally reviewed the clinical findings, laboratory findings, microbiological data and imaging studies. The assessment and treatment plan was discussed with the Nurse Practitioner Gregory Calone. I agree with her/his recommendations except following additions/corrections.  Remains afebrile Soreness + in the surgical site, pain well controlled  Denies nausea, vomiting and diarrhea  10/14 OR cx Kleb pneumoniae ( S to cefazolin) same susceptibility as blood cultures   Plan PICC line  Continue cefazolin, plan for 6 weeks from date of OR on 01/25/21 ID pharmacy to place OPAT orders Monitor CBC and BMP Follow up with [facility] has been made Wound management per NeuroSx ID will sign off for now. Please call with questions   Sabina Manandhar, MD Infectious Disease Physician [place]  [facility] [address] [place], [place] [address] Phone: [phone]  Fax: [phone]   [facility]  Date of Admission:  01/25/2021     Total days of antibiotics 10         ASSESSMENT:  Mr. Lattin's surgical specimens are growing Klebsiella pneumoniae with susceptibilities pending which is concordance with previous blood cultures. Given post-operative surgical wound will need 6 weeks of IV antibiotics with Cefazolin. End date tentatively planned for 03/11/21 with home health and OPAT orders placed. Ok for PICC line placement. Continue with wound care per Dr. Ostergard. Will continue to monitor. Follow up arranged in ID clinic in 4 weeks. Will await sensitivities prior to signing off.   PLAN:  Continue cefazolin through 03/11/21. PICC line placement. Monitor cultures for susceptibilities Klebsiella. Wound care per Neurosurgery. OPAT and Home Health orders placed. Follow up in ID clinic.   Diagnosis:  Post-op wound infection  Culture Result: Klebsiella pneumoniae  Allergies   Allergen Reactions   Cymbalta [Duloxetine Hcl] Other (See Comments)   Penicillins Other (See Comments)    REACTION: Blister and Sores in the mouth    OPAT Orders Discharge antibiotics to be given via PICC line Discharge antibiotics: Per pharmacy protocol  Duration: 6 weeks End Date: 03/11/21  PIC Care Per Protocol:  Home health RN for IV administration and teaching; PICC line care and labs.    Labs weekly while on IV antibiotics: _X_ CBC with differential _X_ BMP __ CMP _X_ CRP _X_ ESR __ Vancomycin trough __ CK  __ Please pull PIC at completion of IV antibiotics _X_ Please leave PIC in place until doctor has seen patient or been notified  Fax weekly labs to [phone]  Clinic Follow Up Appt:  2:30pm on 03/01/21 with Dr. Manandhar    Active Problems:   Wound infection after surgery   Klebsiella pneumoniae infection    Chlorhexidine Gluconate Cloth  6 each Topical Daily   gabapentin  600 mg Oral TID   hydrochlorothiazide  12.5 mg Oral Daily   influenza vac split quadrivalent PF  0.5 mL Intramuscular Tomorrow-1000   losartan  100 mg Oral Daily   metFORMIN  1,000 mg Oral BID WC   rosuvastatin  20 mg Oral Daily   senna  1 tablet Oral BID   sodium chloride flush  3 mL Intravenous Q12H   tamsulosin  0.4 mg Oral Daily    SUBJECTIVE:  Afebrile overnight with no acute events. Surgical specimens from 10/14 growing Klebsiella pneumoniae. Repeat blood cultures from 10/16 without growth to date. Currently with increased levels of pain secondary to earlier dressing change.   Allergies  Allergen Reactions     Cymbalta [Duloxetine Hcl] Other (See Comments)   Penicillins Other (See Comments)    REACTION: Blister and Sores in the mouth     Review of Systems: Review of Systems  Constitutional:  Negative for chills, fever and weight loss.  Respiratory:  Negative for cough, shortness of breath and wheezing.   Cardiovascular:  Negative for chest pain and leg  swelling.  Gastrointestinal:  Negative for abdominal pain, constipation, diarrhea, nausea and vomiting.  Musculoskeletal:  Positive for back pain.  Skin:  Negative for rash.     OBJECTIVE: Vitals:   01/27/21 2032 01/28/21 0006 01/28/21 0338 01/28/21 0754  BP: 133/73 (!) 143/83 140/69 137/81  Pulse: (!) 101 100 99 (!) 107  Resp: 18 18 18 15  Temp: 99 F (37.2 C) 99.1 F (37.3 C) 98.9 F (37.2 C) 98.6 F (37 C)  TempSrc: Oral Oral Oral Oral  SpO2: 95% 95% 98% 94%   There is no height or weight on file to calculate BMI.  Physical Exam Constitutional:      General: He is not in acute distress.    Appearance: He is well-developed.     Comments: Lying in bed with head of bed partially elevated; pleasant.   Cardiovascular:     Rate and Rhythm: Normal rate and regular rhythm.     Heart sounds: Normal heart sounds.  Pulmonary:     Effort: Pulmonary effort is normal.     Breath sounds: Normal breath sounds.  Skin:    General: Skin is warm and dry.  Neurological:     Mental Status: He is alert and oriented to person, place, and time.  Psychiatric:        Behavior: Behavior normal.        Thought Content: Thought content normal.        Judgment: Judgment normal.    Lab Results Lab Results  Component Value Date   WBC 4.0 01/24/2021   HGB 8.4 (L) 01/24/2021   HCT 24.4 (L) 01/24/2021   MCV 87.8 01/24/2021   PLT 206 01/24/2021    Lab Results  Component Value Date   CREATININE 0.92 01/24/2021   BUN 15 01/24/2021   NA 133 (L) 01/24/2021   K 3.7 01/24/2021   CL 99 01/24/2021   CO2 24 01/24/2021    Lab Results  Component Value Date   ALT 23 01/22/2021   AST 22 01/22/2021   ALKPHOS 72 01/22/2021   BILITOT 1.1 01/22/2021     Microbiology: Recent Results (from the past 240 hour(s))  Urine Culture     Status: None   Collection Time: 01/21/21  4:06 PM   Specimen: Urine, Catheterized  Result Value Ref Range Status   Specimen Description URINE, CATHETERIZED  Final    Special Requests Normal  Final   Culture   Final    NO GROWTH Performed at [facility], [address] [place], [place] [address]    Report Status 01/22/2021 FINAL  Final  Culture, blood (routine x 2)     Status: Abnormal   Collection Time: 01/21/21  4:22 PM   Specimen: BLOOD  Result Value Ref Range Status   Specimen Description BLOOD RIGHT ANTECUBITAL  Final   Special Requests   Final    BOTTLES DRAWN AEROBIC AND ANAEROBIC Blood Culture results may not be optimal due to an excessive volume of blood received in culture bottles   Culture  Setup Time   Final    GRAM NEGATIVE RODS IN BOTH   AEROBIC AND ANAEROBIC BOTTLES CRITICAL RESULT CALLED TO, READ BACK BY AND VERIFIED WITH: PHARMD M PHAN [id number] AT 2 BY CM Performed at [facility], [place] [address], [place], [place] [address]    Culture KLEBSIELLA PNEUMONIAE (A)  Final   Report Status 01/24/2021 FINAL  Final   Organism ID, Bacteria KLEBSIELLA PNEUMONIAE  Final      Susceptibility   Klebsiella pneumoniae - MIC*    AMPICILLIN >=32 RESISTANT Resistant     CEFAZOLIN <=4 SENSITIVE Sensitive     CEFEPIME <=0.12 SENSITIVE Sensitive     CEFTAZIDIME <=1 SENSITIVE Sensitive     CEFTRIAXONE <=0.25 SENSITIVE Sensitive     CIPROFLOXACIN <=0.25 SENSITIVE Sensitive     GENTAMICIN <=1 SENSITIVE Sensitive     IMIPENEM <=0.25 SENSITIVE Sensitive     TRIMETH/SULFA <=20 SENSITIVE Sensitive     AMPICILLIN/SULBACTAM 4 SENSITIVE Sensitive     PIP/TAZO <=4 SENSITIVE Sensitive     * KLEBSIELLA PNEUMONIAE  Blood Culture ID Panel (Reflexed)     Status: Abnormal   Collection Time: 01/21/21  4:22 PM  Result Value Ref Range Status   Enterococcus faecalis NOT DETECTED NOT DETECTED Final   Enterococcus Faecium NOT DETECTED NOT DETECTED Final   Listeria monocytogenes NOT DETECTED NOT DETECTED Final   Staphylococcus species NOT DETECTED NOT DETECTED Final   Staphylococcus aureus (BCID) NOT DETECTED NOT DETECTED Final   Staphylococcus  epidermidis NOT DETECTED NOT DETECTED Final   Staphylococcus lugdunensis NOT DETECTED NOT DETECTED Final   Streptococcus species NOT DETECTED NOT DETECTED Final   Streptococcus agalactiae NOT DETECTED NOT DETECTED Final   Streptococcus pneumoniae NOT DETECTED NOT DETECTED Final   Streptococcus pyogenes NOT DETECTED NOT DETECTED Final   A.calcoaceticus-baumannii NOT DETECTED NOT DETECTED Final   Bacteroides fragilis NOT DETECTED NOT DETECTED Final   Enterobacterales DETECTED (A) NOT DETECTED Final    Comment: CRITICAL RESULT CALLED TO, READ BACK BY AND VERIFIED WITH: PHARMD M PHAM [id number] AT 1258 BY CM    Enterobacter cloacae complex NOT DETECTED NOT DETECTED Final   Escherichia coli NOT DETECTED NOT DETECTED Final   Klebsiella aerogenes NOT DETECTED NOT DETECTED Final   Klebsiella oxytoca NOT DETECTED NOT DETECTED Final   Klebsiella pneumoniae DETECTED (A) NOT DETECTED Final    Comment: CRITICAL RESULT CALLED TO, READ BACK BY AND VERIFIED WITH: PHARMD M PHAN [id number] AT 12580 BY CM    Proteus species NOT DETECTED NOT DETECTED Final   Salmonella species NOT DETECTED NOT DETECTED Final   Serratia marcescens NOT DETECTED NOT DETECTED Final   Haemophilus influenzae NOT DETECTED NOT DETECTED Final   Neisseria meningitidis NOT DETECTED NOT DETECTED Final   Pseudomonas aeruginosa NOT DETECTED NOT DETECTED Final   Stenotrophomonas maltophilia NOT DETECTED NOT DETECTED Final   Candida albicans NOT DETECTED NOT DETECTED Final   Candida auris NOT DETECTED NOT DETECTED Final   Candida glabrata NOT DETECTED NOT DETECTED Final   Candida krusei NOT DETECTED NOT DETECTED Final   Candida parapsilosis NOT DETECTED NOT DETECTED Final   Candida tropicalis NOT DETECTED NOT DETECTED Final   Cryptococcus neoformans/gattii NOT DETECTED NOT DETECTED Final   CTX-M ESBL NOT DETECTED NOT DETECTED Final   Carbapenem resistance IMP NOT DETECTED NOT DETECTED Final   Carbapenem resistance KPC NOT DETECTED NOT  DETECTED Final   Carbapenem resistance NDM NOT DETECTED NOT DETECTED Final   Carbapenem resist OXA 48 LIKE NOT DETECTED NOT DETECTED Final   Carbapenem resistance VIM NOT DETECTED NOT DETECTED Final  Comment: Performed at [facility], [place] [address], [place], [place] [address]  Culture, blood (routine x 2)     Status: Abnormal   Collection Time: 01/21/21  4:23 PM   Specimen: BLOOD  Result Value Ref Range Status   Specimen Description BLOOD RIGHT ANTECUBITAL  Final   Special Requests   Final    BOTTLES DRAWN AEROBIC AND ANAEROBIC Blood Culture results may not be optimal due to an excessive volume of blood received in culture bottles   Culture  Setup Time   Final    GRAM NEGATIVE RODS IN BOTH AEROBIC AND ANAEROBIC BOTTLES    Culture (A)  Final    KLEBSIELLA PNEUMONIAE SUSCEPTIBILITIES PERFORMED ON PREVIOUS CULTURE WITHIN THE LAST 5 DAYS. Performed at [facility], [place] [address], [place], [place] [address]    Report Status 01/24/2021 FINAL  Final  Culture, blood (routine x 2)     Status: None   Collection Time: 01/23/21 11:36 AM   Specimen: BLOOD  Result Value Ref Range Status   Specimen Description BLOOD RIGHT ANTECUBITAL  Final   Special Requests IN PEDIATRIC BOTTLE Blood Culture adequate volume  Final   Culture   Final    NO GROWTH 5 DAYS Performed at [facility], [place] [address], [place], [place] [address]    Report Status 01/28/2021 FINAL  Final  Culture, blood (routine x 2)     Status: None   Collection Time: 01/23/21 11:45 AM   Specimen: BLOOD  Result Value Ref Range Status   Specimen Description BLOOD LEFT ANTECUBITAL  Final   Special Requests IN PEDIATRIC BOTTLE Blood Culture adequate volume  Final   Culture   Final    NO GROWTH 5 DAYS Performed at [facility], [place] [address], [place], [place] [address]    Report Status 01/28/2021 FINAL  Final  SARS Coronavirus 2 by RT PCR (hospital order, performed in [facility] hospital lab)  Nasopharyngeal Nasopharyngeal Swab     Status: None   Collection Time: 01/25/21  9:52 AM   Specimen: Nasopharyngeal Swab  Result Value Ref Range Status   SARS Coronavirus 2 NEGATIVE NEGATIVE Final    Comment: (NOTE) SARS-CoV-2 target nucleic acids are NOT DETECTED.  The SARS-CoV-2 RNA is generally detectable in upper and lower respiratory specimens during the acute phase of infection. The lowest concentration of SARS-CoV-2 viral copies this assay can detect is 250 copies / mL. A negative result does not preclude SARS-CoV-2 infection and should not be used as the sole basis for treatment or other patient management decisions.  A negative result may occur with improper specimen collection / handling, submission of specimen other than nasopharyngeal swab, presence of viral mutation(s) within the areas targeted by this assay, and inadequate number of viral copies (<250 copies / mL). A negative result must be combined with clinical observations, patient history, and epidemiological information.  Fact Sheet for Patients:   StrictlyIdeas.no  Fact Sheet for Healthcare Providers: BankingDealers.co.za  This test is not yet approved or  cleared by the [place] FDA and has been authorized for detection and/or diagnosis of SARS-CoV-2 by FDA under an Emergency Use Authorization (EUA).  This EUA will remain in effect (meaning this test can be used) for the duration of the COVID-19 declaration under Section 564(b)(1) of the Act, 21 U.S.C. section 360bbb-3(b)(1), unless the authorization is terminated or revoked sooner.  Performed at [facility], [place] [address] [place], [place] [address]  Surgical pcr screen     Status: Abnormal   Collection Time: 01/25/21  9:52 AM   Specimen: Nasopharyngeal Swab; Nasal Swab  Result Value Ref Range Status   MRSA, PCR POSITIVE (A) NEGATIVE Final    Comment: RESULT CALLED TO, READ BACK BY AND VERIFIED  WITH: RN R PUGH [id number] FCP    Staphylococcus aureus POSITIVE (A) NEGATIVE Final    Comment: (NOTE) The Xpert SA Assay (FDA approved for NASAL specimens in patients 22 years of age and older), is one component of a comprehensive surveillance program. It is not intended to diagnose infection nor to guide or monitor treatment. Performed at [facility], [address] [place], [place] [address]   Aerobic/Anaerobic Culture w Gram Stain (surgical/deep wound)     Status: None (Preliminary result)   Collection Time: 01/25/21  4:19 PM   Specimen: Wound  Result Value Ref Range Status   Specimen Description WOUND  Final   Special Requests  LUMBAR WOUND  Final   Gram Stain   Final    ABUNDANT WBC PRESENT, PREDOMINANTLY PMN NO ORGANISMS SEEN    Culture   Final    RARE KLEBSIELLA PNEUMONIAE SUSCEPTIBILITIES TO FOLLOW Performed at [facility], [address] [place], [place] [address]    Report Status PENDING  Incomplete  Culture, blood (routine x 2)     Status: None (Preliminary result)   Collection Time: 01/27/21  9:54 AM   Specimen: BLOOD  Result Value Ref Range Status   Specimen Description BLOOD RIGHT ANTECUBITAL  Final   Special Requests   Final    BOTTLES DRAWN AEROBIC AND ANAEROBIC Blood Culture adequate volume   Culture   Final    NO GROWTH < 24 HOURS Performed at [facility], [address] [place], [place] [address]    Report Status PENDING  Incomplete  Culture, blood (routine x 2)     Status: None (Preliminary result)   Collection Time: 01/27/21  9:59 AM   Specimen: BLOOD  Result Value Ref Range Status   Specimen Description BLOOD LEFT ANTECUBITAL  Final   Special Requests   Final    BOTTLES DRAWN AEROBIC AND ANAEROBIC Blood Culture adequate volume   Culture   Final    NO GROWTH < 24 HOURS Performed at [facility], [address] [place], [place] [address]    Report Status PENDING  Incomplete     Greg Calone, NP [facility]  01/28/2021  9:51 AM  

## 2021-01-29 MED ORDER — ALUM & MAG HYDROXIDE-SIMETH 200-200-20 MG/5ML PO SUSP
30.0000 mL | Freq: Four times a day (QID) | ORAL | Status: DC | PRN
  Administered 2021-01-29 – 2021-01-30 (×2): 30 mL via ORAL
  Filled 2021-01-29 (×2): qty 30

## 2021-01-29 MED ORDER — HEPARIN SODIUM (PORCINE) 5000 UNIT/ML IJ SOLN
5000.0000 [IU] | Freq: Three times a day (TID) | INTRAMUSCULAR | Status: DC
  Administered 2021-01-29 – 2021-01-31 (×6): 5000 [IU] via SUBCUTANEOUS
  Filled 2021-01-29 (×6): qty 1

## 2021-01-29 NOTE — Progress Notes (Signed)
PT Cancellation Note  Patient Details Name: [name] MRN: [id number] DOB: 1965/12/19   Cancelled Treatment:    Reason Eval/Treat Not Completed: Pain limiting ability to participate;Fatigue/lethargy limiting ability to participate - pt states he just got comfortable, he has been up OOB multiple times today, and he does not want to move for fear of flaring pain up in back. PT encouraged session given recommendation for return to CIR, pt still declines. Will check back tomorrow.   Stacie Glaze, PT DPT Acute Rehabilitation Services Pager [phone]  Office [phone]    Louis Matte 01/29/2021, 4:19 PM

## 2021-01-29 NOTE — Progress Notes (Signed)
Inpatient Rehab Admissions Coordinator:   I continue to await insurance auth for CIR and do not have a bed for this pt. Today. I will follow for potential admit pending insurance auth and bed availability.   Clemens Catholic, [place], [place] Admissions Coordinator  [phone] ([place]) [phone] (office)

## 2021-01-29 NOTE — Op Note (Signed)
   Providing Compassionate, Quality Care - Together  Date of service: 01/25/2021  PREOP DIAGNOSIS:  Lumbar spondylosis L1-S1 Lumbar wound infection with bacteremia  POSTOP DIAGNOSIS: Same  PROCEDURE: Incision and debridement of lumbar wound, placement of wound VAC  SURGEON: Dr. Pieter Partridge C. Chandrea Zellman, DO  ASSISTANT: None  ANESTHESIA: General Endotracheal  EBL: 100 cc  SPECIMENS: Lumbar wound culture  DRAINS: Wound VAC  COMPLICATIONS: None  CONDITION: Hemodynamically stable  HISTORY: [name] is a 54 y.o. male that underwent an L1-S1 open decompression and fusion by Dr. Zada Finders.  He tolerated surgery well.  He ultimately went to inpatient rehab which he began to have wound drainage which persisted.  He then became bacteremic given his continued wound drainage with some purulence, wound incision and debridement possible placement of wound VAC was recommended to the patient and his wife.  All risks, benefits and expected outcomes as well as alternatives were discussed and agreed upon.  PROCEDURE IN DETAIL: The patient was brought to the operating room. After induction of general anesthesia, the patient was positioned on the operative table in the prone position. All pressure points were meticulously padded. Skin incision was then marked out and prepped and draped in the usual sterile fashion.  Using a 10 blade, previous incision was opened sharply.  There was some chronic hematoma superficially with slightly purulent fluid.  This was evacuated with suction.  The skin edges were sharply debrided to bleeding healthy tissue.  The fascia was somewhat unhealthy.  And therefore was opened sharply.  The subfascial space was explored, cultured and there was again chronic hematoma material with some purulence.  This was excessively irrigated and removed with suction.  The fascia and muscle was debrided sharply to healthy appearing tissue.  Hemostasis was achieved with monopolar cautery.  The  wound was explored and noted to be excellently hemostatic.  Large wound VAC was placed with foam within the wound in the subfascial and superficial space.  This was tunneled laterally and hooked up to the wound VAC machine which was holding suction appropriately.  At the end of the case all sponge, needle, and instrument counts were correct. The patient was then transferred to the stretcher, extubated, and taken to the post-anesthesia care unit in stable hemodynamic condition.

## 2021-01-29 NOTE — Progress Notes (Signed)
Neurosurgery Service Progress Note  Subjective: No acute events overnight, back pain improved today compared to yesterday   Objective: Vitals:   01/28/21 1913 01/29/21 0016 01/29/21 0321 01/29/21 0754  BP: 103/63 132/63 121/63 134/76  Pulse: 92 88 95 97  Resp: 20 18 18 18   Temp: 98.9 F (37.2 C) 99 F (37.2 C) 98.7 F (37.1 C) 98.6 F (37 C)  TempSrc: Oral Oral Oral Oral  SpO2:   100% 96%    Physical Exam: Strength 5/5 x4, SILTx4, incision intact w/ wound vac in place w/ good seal  Assessment & Plan: 55 y.o. man s/p L1-S1 decompression and PSIF with wound drainage s/p washout and vac placement, recovering well.  -PICC placed yesterday -continue cefazolin per ID recs for K pneumoniae, stop date 11/25 -wound vac changes MWF -start SQH -clinically stable to return to CIR when bed available  Judith Part  01/29/21 10:58 AM

## 2021-01-30 NOTE — Progress Notes (Signed)
Neurosurgery Service Progress Note  Subjective: No acute events overnight, no new complaints this morning  Objective: Vitals:   01/29/21 1539 01/29/21 1946 01/29/21 2327 01/30/21 0405  BP: 114/66 116/72 130/89 122/76  Pulse: (!) 120 (!) 124 (!) 121 (!) 111  Resp:    18  Temp: 99.2 F (37.3 C) 99.2 F (37.3 C) 98.8 F (37.1 C) 98.7 F (37.1 C)  TempSrc: Oral Oral Oral Oral  SpO2: 94% 98% 96% 95%    Physical Exam: Strength 5/5 x4, SILTx4, incision intact w/ wound vac in place w/ good seal  Assessment & Plan: 55 y.o. man s/p L1-S1 decompression and PSIF with wound drainage s/p washout and vac placement, recovering well.  -PICC in place, continue cefazolin per ID recs for K pneumoniae, stop date 11/25 -wound vac changes MWF -SCDs/TEDs/SQH -clinically stable to return to CIR when bed available  Judith Part  01/30/21 7:20 AM

## 2021-01-30 NOTE — Progress Notes (Signed)
Occupational Therapy Treatment Patient Details Name: [name] MRN: [id number] DOB: 1965/10/30 Today's Date: 01/30/2021   History of present illness 55 y.o. M admitted on 9/30 for L1-2, L2-3, L3-4, L4-5, L5-S1 laminectomies, transforaminal lumbar interbody fusion. CIR 10/7 -10/14. S/P I&D and placement of wound vac 10/14 d/t wound infection.   PMH significant for DDD, DM2, HTN.   OT comments  Patient continues with deficits noted below which are impacting independence.  Patient able to sit at edge of bed and transfer to recliner with Min A.  Basic grooming and UB ADL with supervision, LB ADL Max A seated.  Patient declined mobility in the room, but understands PT will be in later to mobilize.  OT to continue efforts, but CIR is awaiting insurance approval for return to continue post acute rehab prior to home     Recommendations for follow up therapy are one component of a multi-disciplinary discharge planning process, led by the attending physician.  Recommendations may be updated based on patient status, additional functional criteria and insurance authorization.    Follow Up Recommendations  CIR    Equipment Recommendations       Recommendations for Other Services Rehab consult    Precautions / Restrictions Precautions Precautions: Fall;Back;Other (comment) Precaution Booklet Issued: No Precaution Comments: wound vac Other Brace: no brace needed per order Restrictions Weight Bearing Restrictions: No       Mobility Bed Mobility Overal bed mobility: Needs Assistance Bed Mobility: Sidelying to Sit;Sit to Sidelying   Sidelying to sit: Supervision;HOB elevated     Sit to sidelying: Min assist   Patient Response: Restless  Transfers Overall transfer level: Needs assistance Equipment used: Rolling walker (2 wheeled) Transfers: Sit to/from [facility] Sit to Stand: From elevated surface;Min assist Stand pivot transfers: Min guard            Balance  Overall balance assessment: Needs assistance Sitting-balance support: No upper extremity supported;Feet supported Sitting balance-Leahy Scale: Fair     Standing balance support: Bilateral upper extremity supported;During functional activity Standing balance-Leahy Scale: Poor Standing balance comment: Reliant on B UE support on the RW                           ADL either performed or assessed with clinical judgement   ADL Overall ADL's : Needs assistance/impaired     Grooming: Wash/dry hands;Wash/dry face;Oral care;Set up;Sitting   Upper Body Bathing: Set up;Sitting   Lower Body Bathing: Maximal assistance;Sit to/from stand   Upper Body Dressing : Set up;Sitting   Lower Body Dressing: Maximal assistance;Sit to/from stand   Toilet Transfer: Min guard;RW;Ambulation   Toileting- Water quality scientist and Hygiene: Maximal assistance;Sit to/from stand       Functional mobility during ADLs: Rolling walker;Cueing for safety;Min guard                         Cognition Arousal/Alertness: Awake/alert Behavior During Therapy: WFL for tasks assessed/performed Overall Cognitive Status: Within Functional Limits for tasks assessed                                                            Pertinent Vitals/ Pain       Faces Pain Scale: Hurts even more Pain  Location: back Pain Descriptors / Indicators: Discomfort;Grimacing;Guarding;Cramping Pain Intervention(s): Monitored during session                                                          Frequency  Min 2X/week        Progress Toward Goals  OT Goals(current goals can now be found in the care plan section)  Progress towards OT goals: Progressing toward goals  Acute Rehab OT Goals Patient Stated Goal: go back to rehab OT Goal Formulation: With patient Time For Goal Achievement: 02/09/21 Potential to Achieve Goals: Good  Plan Discharge plan remains  appropriate    Co-evaluation                 AM-PAC OT "6 Clicks" Daily Activity     Outcome Measure   Help from another person eating meals?: None Help from another person taking care of personal grooming?: None Help from another person toileting, which includes using toliet, bedpan, or urinal?: A Lot Help from another person bathing (including washing, rinsing, drying)?: A Lot Help from another person to put on and taking off regular upper body clothing?: A Little Help from another person to put on and taking off regular lower body clothing?: A Lot 6 Click Score: 17    End of Session Equipment Utilized During Treatment: Rolling walker  OT Visit Diagnosis: Unsteadiness on feet (R26.81);Other abnormalities of gait and mobility (R26.89);Muscle weakness (generalized) (M62.81);Pain   Activity Tolerance Patient tolerated treatment well   Patient Left in bed;with call bell/phone within reach   Nurse Communication          Time: 1022-1050 OT Time Calculation (min): 28 min  Charges: OT General Charges $OT Visit: 1 Visit OT Treatments $Self Care/Home Management : 23-37 mins  01/30/2021  RP, OTR/L  Acute Rehabilitation Services  Office:  [phone]   Metta Clines 01/30/2021, 11:00 AM

## 2021-01-30 NOTE — Progress Notes (Signed)
Inpatient Rehabilitation Admissions Coordinator   I met at bedside with patient. I have insurance approval and am hopeful for a CIR bed tomorrow. I have encouraged him to transition from IV to po pain regimen except for VAC changes.  Danne Baxter, RN, MSN Rehab Admissions Coordinator [phone] 01/30/2021 11:35 AM

## 2021-01-30 NOTE — Progress Notes (Signed)
Physical Therapy Treatment Patient Details Name: [name] MRN: [id number] DOB: 1965-06-15 Today's Date: 01/30/2021   History of Present Illness 55 y.o. M admitted on 9/30 for L1-2, L2-3, L3-4, L4-5, L5-S1 laminectomies, transforaminal lumbar interbody fusion. CIR 10/7 -10/14. S/P I&D and placement of wound vac 10/14 d/t wound infection.   PMH significant for DDD, DM2, HTN.    PT Comments    Patient resistant to mobility initially but with encouragement, agreeable. Patient requires minA to rise from high elevated surface. Patient ambulated 12' with minA and heavy reliance on RW. Trunk flexed posture throughout requiring cues for correction. Encouraged mobility during day for progression. Continue to recommend comprehensive inpatient rehab (CIR) for post-acute therapy needs.     Recommendations for follow up therapy are one component of a multi-disciplinary discharge planning process, led by the attending physician.  Recommendations may be updated based on patient status, additional functional criteria and insurance authorization.  Follow Up Recommendations  CIR     Equipment Recommendations  Rolling Arieal Cuoco with 5" wheels    Recommendations for Other Services       Precautions / Restrictions Precautions Precautions: Fall;Back;Other (comment) Precaution Booklet Issued: No Precaution Comments: wound vac Required Braces or Orthoses: Other Brace Other Brace: no brace needed per order     Mobility  Bed Mobility Overal bed mobility: Needs Assistance Bed Mobility: Rolling;Sidelying to Sit;Sit to Sidelying Rolling: Supervision Sidelying to sit: Supervision     Sit to sidelying: Min assist General bed mobility comments: minA for bringing LEs back into bed. Use of rail    Transfers Overall transfer level: Needs assistance Equipment used: Rolling Demarko Zeimet (2 wheeled) Transfers: Sit to/from Stand Sit to Stand: Min assist;From elevated surface         General transfer  comment: minA from very elevated surface  Ambulation/Gait Ambulation/Gait assistance: Min assist Gait Distance (Feet): 40 Feet Assistive device: Rolling Garvey Westcott (2 wheeled) Gait Pattern/deviations: Step-through pattern;Decreased stride length;Trunk flexed Gait velocity: decreased   General Gait Details: heavy reliance on RW. MinA for balance and RW management. Cues for upright posture throughout   Stairs             Wheelchair Mobility    Modified Rankin (Stroke Patients Only)       Balance Overall balance assessment: Needs assistance Sitting-balance support: No upper extremity supported;Feet supported Sitting balance-Leahy Scale: Fair     Standing balance support: Bilateral upper extremity supported;During functional activity Standing balance-Leahy Scale: Poor Standing balance comment: Reliant on B UE support on the RW                            Cognition Arousal/Alertness: Awake/alert Behavior During Therapy: WFL for tasks assessed/performed Overall Cognitive Status: Within Functional Limits for tasks assessed                                        Exercises      General Comments        Pertinent Vitals/Pain Pain Assessment: Faces Faces Pain Scale: Hurts whole lot Pain Location: back Pain Descriptors / Indicators: Discomfort;Grimacing;Guarding;Cramping Pain Intervention(s): Monitored during session;Repositioned    Home Living                      Prior Function            PT Goals (current  goals can now be found in the care plan section) Acute Rehab PT Goals Patient Stated Goal: go back to rehab PT Goal Formulation: With patient Time For Goal Achievement: 02/09/21 Potential to Achieve Goals: Good Progress towards PT goals: Progressing toward goals    Frequency    Min 5X/week      PT Plan Current plan remains appropriate    Co-evaluation              AM-PAC PT "6 Clicks" Mobility   Outcome  Measure  Help needed turning from your back to your side while in a flat bed without using bedrails?: A Little Help needed moving from lying on your back to sitting on the side of a flat bed without using bedrails?: A Little Help needed moving to and from a bed to a chair (including a wheelchair)?: A Little Help needed standing up from a chair using your arms (e.g., wheelchair or bedside chair)?: A Little Help needed to walk in hospital room?: A Little Help needed climbing 3-5 steps with a railing? : Total 6 Click Score: 16    End of Session Equipment Utilized During Treatment: Gait belt Activity Tolerance: Patient tolerated treatment well Patient left: in bed;with call bell/phone within reach;with bed alarm set Nurse Communication: Mobility status PT Visit Diagnosis: Unsteadiness on feet (R26.81);Pain Pain - Right/Left: Right     Time: 1245-8099 PT Time Calculation (min) (ACUTE ONLY): 25 min  Charges:  $Gait Training: 23-37 mins                     Karman Veney A. Gilford Rile PT, DPT Acute Rehabilitation Services Pager [phone] Office [phone]    Linna Hoff 01/30/2021, 5:03 PM

## 2021-01-30 NOTE — Consult Note (Addendum)
[place] Nurse wound follow up Patient receiving care in [facility] 4N10 Wound type: Surgical lumbar wound Measurement: 22 x 7.2 x 5 Wound bed: red granulation tissue with yellow fibrinous tissue throughout the wound.  Drainage (amount, consistency, odor) Serosanguinous  n the canister. 425 cc in the canister. Changed the canister. Periwound: minor maceration around the edges of the wound.  Dressing procedure/placement/frequency: 3 pieces of black foam removed. Used 45M skin barrier wipes on the surrounding skin. 3 pieces of black foam replaced. Had medium foam dressing kit in the room. This wound requires a large foam dressing. Had to piece together to cover the entire wound surface. Bridged over to the left of the wound. 4 pieces of black foam used all together. Drape applied, immediate suction obtained at 125 mmHg. Supplies ordered to be placed at bedside.   WOC will follow M W F for vac dressing changes.  Christian Marseilles Tamala Julian, MSN, RN, [place], Lysle Pearl, [facility] Wound Treatment Associate Pager [phone]

## 2021-01-31 ENCOUNTER — Encounter (HOSPITAL_COMMUNITY): Payer: Self-pay | Admitting: Physical Medicine and Rehabilitation

## 2021-01-31 ENCOUNTER — Inpatient Hospital Stay (HOSPITAL_COMMUNITY)
Admission: RE | Admit: 2021-01-31 | Discharge: 2021-03-08 | DRG: 092 | Disposition: A | Discharge status: Home / Self Care | Payer: 59 | Source: Intra-hospital | Attending: Physical Medicine and Rehabilitation | Admitting: Physical Medicine and Rehabilitation

## 2021-01-31 ENCOUNTER — Other Ambulatory Visit: Payer: Self-pay

## 2021-01-31 DIAGNOSIS — G062 Extradural and subdural abscess, unspecified: Secondary | ICD-10-CM

## 2021-01-31 DIAGNOSIS — E785 Hyperlipidemia, unspecified: Secondary | ICD-10-CM | POA: Diagnosis present

## 2021-01-31 DIAGNOSIS — R112 Nausea with vomiting, unspecified: Secondary | ICD-10-CM

## 2021-01-31 DIAGNOSIS — N319 Neuromuscular dysfunction of bladder, unspecified: Secondary | ICD-10-CM | POA: Diagnosis present

## 2021-01-31 DIAGNOSIS — M5106 Intervertebral disc disorders with myelopathy, lumbar region: Secondary | ICD-10-CM | POA: Diagnosis present

## 2021-01-31 DIAGNOSIS — E1140 Type 2 diabetes mellitus with diabetic neuropathy, unspecified: Secondary | ICD-10-CM | POA: Diagnosis present

## 2021-01-31 DIAGNOSIS — M4626 Osteomyelitis of vertebra, lumbar region: Secondary | ICD-10-CM | POA: Diagnosis not present

## 2021-01-31 DIAGNOSIS — M4716 Other spondylosis with myelopathy, lumbar region: Secondary | ICD-10-CM | POA: Diagnosis present

## 2021-01-31 DIAGNOSIS — T8149XA Infection following a procedure, other surgical site, initial encounter: Secondary | ICD-10-CM | POA: Diagnosis not present

## 2021-01-31 DIAGNOSIS — M62838 Other muscle spasm: Secondary | ICD-10-CM | POA: Diagnosis not present

## 2021-01-31 DIAGNOSIS — E1165 Type 2 diabetes mellitus with hyperglycemia: Secondary | ICD-10-CM | POA: Diagnosis present

## 2021-01-31 DIAGNOSIS — D62 Acute posthemorrhagic anemia: Secondary | ICD-10-CM | POA: Diagnosis present

## 2021-01-31 DIAGNOSIS — R7881 Bacteremia: Secondary | ICD-10-CM | POA: Diagnosis present

## 2021-01-31 DIAGNOSIS — E46 Unspecified protein-calorie malnutrition: Secondary | ICD-10-CM

## 2021-01-31 DIAGNOSIS — M4646 Discitis, unspecified, lumbar region: Secondary | ICD-10-CM | POA: Diagnosis not present

## 2021-01-31 DIAGNOSIS — G4733 Obstructive sleep apnea (adult) (pediatric): Secondary | ICD-10-CM | POA: Diagnosis present

## 2021-01-31 DIAGNOSIS — R338 Other retention of urine: Secondary | ICD-10-CM | POA: Diagnosis not present

## 2021-01-31 DIAGNOSIS — Z95828 Presence of other vascular implants and grafts: Secondary | ICD-10-CM

## 2021-01-31 DIAGNOSIS — B961 Klebsiella pneumoniae [K. pneumoniae] as the cause of diseases classified elsewhere: Secondary | ICD-10-CM | POA: Diagnosis present

## 2021-01-31 DIAGNOSIS — R161 Splenomegaly, not elsewhere classified: Secondary | ICD-10-CM | POA: Diagnosis present

## 2021-01-31 DIAGNOSIS — I10 Essential (primary) hypertension: Secondary | ICD-10-CM | POA: Diagnosis present

## 2021-01-31 DIAGNOSIS — Z7984 Long term (current) use of oral hypoglycemic drugs: Secondary | ICD-10-CM

## 2021-01-31 DIAGNOSIS — R197 Diarrhea, unspecified: Secondary | ICD-10-CM | POA: Diagnosis not present

## 2021-01-31 DIAGNOSIS — E876 Hypokalemia: Secondary | ICD-10-CM | POA: Diagnosis not present

## 2021-01-31 DIAGNOSIS — E8809 Other disorders of plasma-protein metabolism, not elsewhere classified: Secondary | ICD-10-CM | POA: Diagnosis present

## 2021-01-31 DIAGNOSIS — K6812 Psoas muscle abscess: Secondary | ICD-10-CM

## 2021-01-31 DIAGNOSIS — Z8619 Personal history of other infectious and parasitic diseases: Secondary | ICD-10-CM

## 2021-01-31 DIAGNOSIS — G8918 Other acute postprocedural pain: Secondary | ICD-10-CM | POA: Diagnosis present

## 2021-01-31 DIAGNOSIS — T8143XA Infection following a procedure, organ and space surgical site, initial encounter: Secondary | ICD-10-CM | POA: Diagnosis not present

## 2021-01-31 DIAGNOSIS — M26212 Malocclusion, Angle's class II: Secondary | ICD-10-CM | POA: Diagnosis not present

## 2021-01-31 DIAGNOSIS — Z981 Arthrodesis status: Secondary | ICD-10-CM

## 2021-01-31 DIAGNOSIS — M62830 Muscle spasm of back: Secondary | ICD-10-CM | POA: Diagnosis not present

## 2021-01-31 DIAGNOSIS — M1711 Unilateral primary osteoarthritis, right knee: Secondary | ICD-10-CM | POA: Diagnosis present

## 2021-01-31 DIAGNOSIS — S21209A Unspecified open wound of unspecified back wall of thorax without penetration into thoracic cavity, initial encounter: Secondary | ICD-10-CM | POA: Diagnosis not present

## 2021-01-31 DIAGNOSIS — Z79899 Other long term (current) drug therapy: Secondary | ICD-10-CM

## 2021-01-31 DIAGNOSIS — Z888 Allergy status to other drugs, medicaments and biological substances status: Secondary | ICD-10-CM

## 2021-01-31 DIAGNOSIS — E871 Hypo-osmolality and hyponatremia: Secondary | ICD-10-CM | POA: Diagnosis not present

## 2021-01-31 DIAGNOSIS — Z22322 Carrier or suspected carrier of Methicillin resistant Staphylococcus aureus: Secondary | ICD-10-CM

## 2021-01-31 DIAGNOSIS — R2689 Other abnormalities of gait and mobility: Principal | ICD-10-CM | POA: Diagnosis present

## 2021-01-31 DIAGNOSIS — Z6841 Body Mass Index (BMI) 40.0 and over, adult: Secondary | ICD-10-CM

## 2021-01-31 DIAGNOSIS — R339 Retention of urine, unspecified: Secondary | ICD-10-CM | POA: Diagnosis not present

## 2021-01-31 DIAGNOSIS — G8928 Other chronic postprocedural pain: Secondary | ICD-10-CM

## 2021-01-31 DIAGNOSIS — Z880 Allergy status to penicillin: Secondary | ICD-10-CM

## 2021-01-31 DIAGNOSIS — Y838 Other surgical procedures as the cause of abnormal reaction of the patient, or of later complication, without mention of misadventure at the time of the procedure: Secondary | ICD-10-CM | POA: Diagnosis not present

## 2021-01-31 DIAGNOSIS — I951 Orthostatic hypotension: Secondary | ICD-10-CM | POA: Diagnosis not present

## 2021-01-31 DIAGNOSIS — K5901 Slow transit constipation: Secondary | ICD-10-CM

## 2021-01-31 DIAGNOSIS — Z7982 Long term (current) use of aspirin: Secondary | ICD-10-CM

## 2021-01-31 LAB — GLUCOSE, CAPILLARY
Glucose-Capillary: 208 mg/dL — ABNORMAL HIGH (ref 70–99)
Glucose-Capillary: 184 mg/dL — ABNORMAL HIGH (ref 70–99)

## 2021-01-31 LAB — AEROBIC/ANAEROBIC CULTURE W GRAM STAIN (SURGICAL/DEEP WOUND)

## 2021-01-31 MED ORDER — OXYCODONE HCL 5 MG PO TABS
15.0000 mg | ORAL_TABLET | ORAL | Status: DC | PRN
  Administered 2021-01-31 – 2021-03-08 (×105): 15 mg via ORAL
  Filled 2021-01-31 (×106): qty 3

## 2021-01-31 MED ORDER — MENTHOL 3 MG MT LOZG
1.0000 | LOZENGE | OROMUCOSAL | Status: DC | PRN
Start: 2021-01-31 — End: 2021-03-08

## 2021-01-31 MED ORDER — PROCHLORPERAZINE EDISYLATE 10 MG/2ML IJ SOLN
5.0000 mg | Freq: Four times a day (QID) | INTRAMUSCULAR | Status: DC | PRN
  Administered 2021-02-07 – 2021-03-06 (×7): 10 mg via INTRAMUSCULAR
  Filled 2021-01-31 (×8): qty 2

## 2021-01-31 MED ORDER — TAMSULOSIN HCL 0.4 MG PO CAPS
0.4000 mg | ORAL_CAPSULE | Freq: Once | ORAL | Status: AC
  Administered 2021-01-31: 0.4 mg via ORAL
  Filled 2021-01-31: qty 1

## 2021-01-31 MED ORDER — ENOXAPARIN SODIUM 40 MG/0.4ML IJ SOSY
40.0000 mg | PREFILLED_SYRINGE | INTRAMUSCULAR | Status: DC
  Administered 2021-01-31 – 2021-02-09 (×10): 40 mg via SUBCUTANEOUS
  Filled 2021-01-31 (×10): qty 0.4

## 2021-01-31 MED ORDER — HYDROCHLOROTHIAZIDE 12.5 MG PO TABS
12.5000 mg | ORAL_TABLET | Freq: Every day | ORAL | Status: DC
  Administered 2021-02-01 – 2021-02-04 (×4): 12.5 mg via ORAL
  Filled 2021-01-31 (×4): qty 1

## 2021-01-31 MED ORDER — MORPHINE SULFATE ER 15 MG PO TBCR: 15.0000 mg | EXTENDED_RELEASE_TABLET | Freq: Two times a day (BID) | ORAL | Status: DC

## 2021-01-31 MED ORDER — DIPHENHYDRAMINE HCL 12.5 MG/5ML PO ELIX: 12.5000 mg | ORAL_SOLUTION | Freq: Four times a day (QID) | ORAL | Status: DC | PRN

## 2021-01-31 MED ORDER — TRAZODONE HCL 50 MG PO TABS
25.0000 mg | ORAL_TABLET | Freq: Every evening | ORAL | Status: DC | PRN
  Administered 2021-01-31 – 2021-03-05 (×17): 50 mg via ORAL
  Filled 2021-01-31 (×17): qty 1

## 2021-01-31 MED ORDER — MORPHINE SULFATE ER 15 MG PO TBCR
15.0000 mg | EXTENDED_RELEASE_TABLET | Freq: Two times a day (BID) | ORAL | Status: DC
  Administered 2021-01-31 – 2021-02-05 (×10): 15 mg via ORAL
  Filled 2021-01-31 (×10): qty 1

## 2021-01-31 MED ORDER — CYCLOBENZAPRINE HCL 5 MG PO TABS
5.0000 mg | ORAL_TABLET | Freq: Three times a day (TID) | ORAL | Status: DC | PRN
  Administered 2021-02-01 – 2021-02-03 (×4): 5 mg via ORAL
  Filled 2021-01-31 (×4): qty 1

## 2021-01-31 MED ORDER — HYDROMORPHONE HCL 1 MG/ML IJ SOLN
1.0000 mg | INTRAMUSCULAR | Status: DC
  Administered 2021-02-01 – 2021-02-08 (×4): 1 mg via INTRAVENOUS
  Filled 2021-01-31 (×4): qty 1

## 2021-01-31 MED ORDER — PROCHLORPERAZINE MALEATE 5 MG PO TABS
5.0000 mg | ORAL_TABLET | Freq: Four times a day (QID) | ORAL | Status: DC | PRN
  Administered 2021-02-21 – 2021-03-03 (×3): 10 mg via ORAL
  Filled 2021-01-31 (×3): qty 2

## 2021-01-31 MED ORDER — FLEET ENEMA 7-19 GM/118ML RE ENEM
1.0000 | ENEMA | Freq: Once | RECTAL | Status: AC | PRN
  Administered 2021-02-06: 1 via RECTAL
  Filled 2021-01-31: qty 1

## 2021-01-31 MED ORDER — SODIUM CHLORIDE 0.9% FLUSH: 10.0000 mL | INTRAVENOUS | Status: DC | PRN

## 2021-01-31 MED ORDER — CEFAZOLIN SODIUM-DEXTROSE 2-4 GM/100ML-% IV SOLN
2.0000 g | Freq: Three times a day (TID) | INTRAVENOUS | Status: DC
  Administered 2021-01-31 – 2021-03-08 (×108): 2 g via INTRAVENOUS
  Filled 2021-01-31 (×112): qty 100

## 2021-01-31 MED ORDER — GUAIFENESIN-DM 100-10 MG/5ML PO SYRP: 5.0000 mL | ORAL_SOLUTION | Freq: Four times a day (QID) | ORAL | Status: DC | PRN

## 2021-01-31 MED ORDER — BISACODYL 10 MG RE SUPP
10.0000 mg | Freq: Every day | RECTAL | Status: DC | PRN
  Administered 2021-02-05 – 2021-03-05 (×12): 10 mg via RECTAL
  Filled 2021-01-31 (×13): qty 1

## 2021-01-31 MED ORDER — SENNA 8.6 MG PO TABS
1.0000 | ORAL_TABLET | Freq: Two times a day (BID) | ORAL | Status: DC
  Administered 2021-01-31 – 2021-02-04 (×8): 8.6 mg via ORAL
  Filled 2021-01-31 (×8): qty 1

## 2021-01-31 MED ORDER — TIZANIDINE HCL 4 MG PO TABS
4.0000 mg | ORAL_TABLET | Freq: Three times a day (TID) | ORAL | Status: DC
  Administered 2021-01-31 – 2021-03-08 (×107): 4 mg via ORAL
  Filled 2021-01-31 (×107): qty 1

## 2021-01-31 MED ORDER — CHLORHEXIDINE GLUCONATE CLOTH 2 % EX PADS
6.0000 | MEDICATED_PAD | Freq: Every day | CUTANEOUS | Status: DC
  Administered 2021-02-01 – 2021-02-12 (×12): 6 via TOPICAL

## 2021-01-31 MED ORDER — POLYETHYLENE GLYCOL 3350 17 G PO PACK
17.0000 g | PACK | Freq: Every day | ORAL | Status: DC | PRN
  Administered 2021-02-04 – 2021-02-05 (×2): 17 g via ORAL
  Filled 2021-01-31 (×2): qty 1

## 2021-01-31 MED ORDER — MUSCLE RUB 10-15 % EX CREA
TOPICAL_CREAM | Freq: Three times a day (TID) | CUTANEOUS | Status: DC
  Administered 2021-02-01 – 2021-02-19 (×9): 1 via TOPICAL
  Filled 2021-01-31 (×4): qty 85

## 2021-01-31 MED ORDER — ACETAMINOPHEN 325 MG PO TABS
325.0000 mg | ORAL_TABLET | ORAL | Status: DC | PRN
  Administered 2021-02-06 – 2021-02-14 (×5): 650 mg via ORAL
  Filled 2021-01-31 (×5): qty 2

## 2021-01-31 MED ORDER — MUSCLE RUB 10-15 % EX CREA: TOPICAL_CREAM | CUTANEOUS | Status: DC | PRN

## 2021-01-31 MED ORDER — ALUM & MAG HYDROXIDE-SIMETH 200-200-20 MG/5ML PO SUSP
30.0000 mL | ORAL | Status: DC | PRN
Start: 2021-01-31 — End: 2021-03-08
  Administered 2021-02-02 – 2021-03-04 (×5): 30 mL via ORAL
  Filled 2021-01-31 (×5): qty 30

## 2021-01-31 MED ORDER — PROCHLORPERAZINE 25 MG RE SUPP: 12.5000 mg | Freq: Four times a day (QID) | RECTAL | Status: DC | PRN

## 2021-01-31 MED ORDER — PROSOURCE PLUS PO LIQD
30.0000 mL | Freq: Two times a day (BID) | ORAL | Status: DC
  Administered 2021-02-01 – 2021-02-02 (×4): 30 mL via ORAL
  Filled 2021-01-31 (×4): qty 30

## 2021-01-31 MED ORDER — SODIUM CHLORIDE 0.9% FLUSH
10.0000 mL | Freq: Two times a day (BID) | INTRAVENOUS | Status: DC
  Administered 2021-01-31: 10 mL

## 2021-01-31 MED ORDER — PHENOL 1.4 % MT LIQD
1.0000 | OROMUCOSAL | Status: DC | PRN
  Filled 2021-01-31: qty 177

## 2021-01-31 MED ORDER — TAMSULOSIN HCL 0.4 MG PO CAPS: 0.4000 mg | ORAL_CAPSULE | Freq: Every day | ORAL | Status: DC

## 2021-01-31 MED ORDER — METFORMIN HCL 500 MG PO TABS
1000.0000 mg | ORAL_TABLET | Freq: Two times a day (BID) | ORAL | Status: DC
  Administered 2021-01-31 – 2021-03-08 (×72): 1000 mg via ORAL
  Filled 2021-01-31 (×73): qty 2

## 2021-01-31 MED ORDER — GABAPENTIN 600 MG PO TABS
600.0000 mg | ORAL_TABLET | Freq: Three times a day (TID) | ORAL | Status: DC
  Administered 2021-01-31 – 2021-02-19 (×56): 600 mg via ORAL
  Filled 2021-01-31 (×56): qty 1

## 2021-01-31 MED ORDER — ASCORBIC ACID 500 MG PO TABS
500.0000 mg | ORAL_TABLET | Freq: Two times a day (BID) | ORAL | Status: DC
  Administered 2021-01-31 – 2021-03-08 (×72): 500 mg via ORAL
  Filled 2021-01-31 (×72): qty 1

## 2021-01-31 MED ORDER — TAMSULOSIN HCL 0.4 MG PO CAPS
0.8000 mg | ORAL_CAPSULE | Freq: Every day | ORAL | Status: DC
  Administered 2021-02-01 – 2021-03-08 (×36): 0.8 mg via ORAL
  Filled 2021-01-31 (×36): qty 2

## 2021-01-31 MED ORDER — ZINC SULFATE 220 (50 ZN) MG PO CAPS
220.0000 mg | ORAL_CAPSULE | Freq: Every day | ORAL | Status: DC
  Administered 2021-01-31 – 2021-02-14 (×15): 220 mg via ORAL
  Filled 2021-01-31 (×15): qty 1

## 2021-01-31 MED ORDER — LOSARTAN POTASSIUM 50 MG PO TABS
100.0000 mg | ORAL_TABLET | Freq: Every day | ORAL | Status: DC
  Administered 2021-02-01 – 2021-03-08 (×36): 100 mg via ORAL
  Filled 2021-01-31 (×36): qty 2

## 2021-01-31 MED ORDER — CEFAZOLIN IV (FOR PTA / DISCHARGE USE ONLY): 2.0000 g | Freq: Three times a day (TID) | INTRAVENOUS | 0 refills | Status: DC

## 2021-01-31 MED ORDER — LIDOCAINE HCL URETHRAL/MUCOSAL 2 % EX GEL
CUTANEOUS | Status: DC | PRN
  Filled 2021-01-31: qty 6

## 2021-01-31 MED ORDER — ROSUVASTATIN CALCIUM 20 MG PO TABS
20.0000 mg | ORAL_TABLET | Freq: Every day | ORAL | Status: DC
  Administered 2021-02-01 – 2021-02-14 (×14): 20 mg via ORAL
  Filled 2021-01-31 (×14): qty 1

## 2021-01-31 NOTE — Progress Notes (Signed)
Courtney Heys, MD   Physician  Physical Medicine and Rehabilitation  PMR Pre-admission     Signed  Date of Service:  01/28/2021  3:52 PM       Related encounter: Admission (Discharged) from 01/25/2021 in [facility]      Show:Clear all _0 Written_1 Templated_2 Copied  Added by: _3 Cristina Gong, RN_4 Courtney Heys, MD  _5 Hover for details                                                                                                                                                                                                                                                                                                                                                                                                                                     PMR Admission Coordinator Pre-Admission Assessment   Patient: [name] is an 55 y.o., male MRN: [id number] DOB: 06-08-1965 Height:   Weight:     Insurance Information HMO:     PPO:      PCP:      IPA:      80/20:      OTHER:  PRIMARY: [employer]      Policy#: [id number]      Subscriber: pt CM Name: Jackelyn Poling   Phone#: [phone]     Fax#: [phone] or e-mail [email or web address] Pre-Cert#: [id number]   approved for 7 days  Employer:  Benefits:  Phone #: [phone]     Name: 10/6 Eff. Date: 04/14/2020     Deduct: none      Out of Pocket Max: $5000      Life Max: none CIR: $250 co pay per admit then insurance covers 80%      SNF: $250 co pay per admit, then insurance covers 80% for 100 days Outpatient: first 15 visits no charge, then 16 until 100 80%     Co-Pay:  Home Health: 80 %      Co-Pay: 100 visits combined DME: 80%     Co-Pay: 20% Providers: in network  SECONDARY: none         Financial Counselor:       Phone#:    The  Engineer, petroleum" for patients in Inpatient Rehabilitation Facilities with attached "Privacy Act [place] Records" was provided and verbally reviewed with: N/A   Emergency Contact Information Contact Information       Name Relation Home Work [place] [phone]   [phone]    Kacee, Koren     [phone]           Current Medical History  Patient Admitting Diagnosis: Lumbar radiculopathy   History: 55 y.o. male with history of DVT, T2DM, OSA, obesity, lumbar stenosis with RLE radiculopathy pain limiting functional ability 6 months.  He was found to have lumbar cyst with severe canal stenosis from L2/L3 to L4/L5 and was admitted on 01/11/2021 for L1-S1 laminotomies with PLIF by Dr. Venetia Constable.  Postop noted to have elevated CK at 2 2972 with lactic acidosis, low mag levels, intermittent tachycardia with heart rates up to 120s as well as acute blood loss anemia and hyponatremia.  Hyponatremia felt to be dilutional from fluid boluses which are required for episodic hypotension.  CK was trending downward to 1970 and hyponatremia resolved improving with sodium up to 131.  He had issues with urinary retention requiring Foley placement.  Has also had constipation , poor appetite, bleeding from incision, neuropathy and spasticity as well as orthostatic symptoms affect him mobility and ADLs.     He was admitted to rehab 01/18/2021 . He was noted to have bogginess as well as drainage from superior aspect of wound and therefore added on doxycycline for wound prophylaxis.     He was also noted to have issues with hypotension as well as AKI and was treated with IV fluids briefly.  Hospital course was significant for ongoing issues with wound drainage as well as low-grade fevers.  He did develop hypotension with tachycardia as well as rising temp to 100.4 on 10/10 due to SIRS.  He was started on broad-spectrum antibiotics and was pan cultured.    UA/urine culture was negative for infection but 2/2 blood cultures grew Kleb pneumonia.  Neurosurgery was consulted for input and recommended local care for management of back incision and did not feel that back was a source of infection.   Antibiotics were narrowed to ceftriaxone and ID was consulted for input.  Dr. Cheri Guppy recommended continuing ceftriaxone as well as imaging for further work-up.  CT of abdomen pelvis was done due to ongoing issues with abdominal discomfort and was negative for acute changes but did show enlarged spleen.  some time in the past.  Lumbar spine CT done due to ongoing back drainage and showed fluid collection in the soft tissues and musculature posterior to L1/S1 posterior fixation most likely postop seroma and soft tissue  component of collection extending from T10-S1 without significant enhancement to suggest abscess.  Neurosurgery was contacted again for input and recommended taking patient to the OR for cleanout of wound and no further intervention. He was discharged to acute hospital on 01/25/21.   Taken to OR on 01/25/2021 by Dr Dawley for washout and wound VAC placement. Postop pain meds for better pain control. PICC to be placed for long term antibiotics of Cefazolin for 6 weeks with stop date of 11/25. Wound VAC changes MWF.   Patient[facility] from [facility] has been reviewed by the rehabilitation admission coordinator and physician.   Past Medical History      Past Medical History:  Diagnosis Date   DDD (degenerative disc disease), lumbar     Diabetes mellitus     Hyperlipemia     Hypertension     Obesity     OSA (obstructive sleep apnea)      on CPAP    Has the patient had major surgery during 100 days prior to admission? Yes   Family History   family history includes Diabetes in his mother.   Current Medications   Current Facility-Administered Medications:    0.9 %  sodium chloride infusion, 250 mL, Intravenous, Continuous,  Dawley, Troy C, DO   acetaminophen (TYLENOL) tablet 650 mg, 650 mg, Oral, Q4H PRN, 650 mg at 01/31/21 0438 **OR** acetaminophen (TYLENOL) suppository 650 mg, 650 mg, Rectal, Q4H PRN, Dawley, Troy C, DO   alum & mag hydroxide-simeth (MAALOX/MYLANTA) 200-200-20 MG/5ML suspension 30 mL, 30 mL, Oral, Q6H PRN, Reinaldo Meeker, Meghan D, NP, 30 mL at 01/30/21 1716   ceFAZolin (ANCEF) IVPB 2g/100 mL premix, 2 g, Intravenous, Q8H, Manandhar, Collene Mares, MD, Stopped at 01/31/21 0509   Chlorhexidine Gluconate Cloth 2 % PADS 6 each, 6 each, Topical, Daily, Judith Part, MD, 6 each at 01/30/21 1000   gabapentin (NEURONTIN) tablet 600 mg, 600 mg, Oral, TID, Dawley, Troy C, DO, 600 mg at 01/31/21 0851   heparin injection 5,000 Units, 5,000 Units, Subcutaneous, Q8H, Ostergard, Joyice Faster, MD, 5,000 Units at 01/31/21 0440   hydrochlorothiazide (MICROZIDE) capsule 12.5 mg, 12.5 mg, Oral, Daily, Paytes, Austin A, RPH, 12.5 mg at 01/31/21 0851   HYDROmorphone (DILAUDID) injection 1 mg, 1 mg, Intravenous, Q3H PRN, Judith Part, MD, 1 mg at 01/30/21 1945   influenza vac split quadrivalent PF (FLUARIX) injection 0.5 mL, 0.5 mL, Intramuscular, Tomorrow-1000, Ostergard, Joyice Faster, MD   losartan (COZAAR) tablet 100 mg, 100 mg, Oral, Daily, Paytes, Austin A, RPH, 100 mg at 01/31/21 2094   menthol-cetylpyridinium (CEPACOL) lozenge 3 mg, 1 lozenge, Oral, PRN **OR** phenol (CHLORASEPTIC) mouth spray 1 spray, 1 spray, Mouth/Throat, PRN, Dawley, Troy C, DO   metFORMIN (GLUCOPHAGE) tablet 1,000 mg, 1,000 mg, Oral, BID WC, Dawley, Troy C, DO, 1,000 mg at 01/31/21 7096   Muscle Rub CREA, , Topical, PRN, Newman Pies, MD   ondansetron [facility]) tablet 4 mg, 4 mg, Oral, Q6H PRN **OR** ondansetron (ZOFRAN) injection 4 mg, 4 mg, Intravenous, Q6H PRN, Dawley, Troy C, DO   oxyCODONE (Oxy IR/ROXICODONE) immediate release tablet 10 mg, 10 mg, Oral, Q4H PRN, Judith Part, MD   oxyCODONE (Oxy IR/ROXICODONE) immediate release tablet 15  mg, 15 mg, Oral, Q4H PRN, Judith Part, MD, 15 mg at 01/31/21 0850   rosuvastatin (CRESTOR) tablet 20 mg, 20 mg, Oral, Daily, Dawley, Troy C, DO, 20 mg at 01/31/21 0851   senna (SENOKOT) tablet 8.6 mg, 1 tablet, Oral, BID,  Dawley, Troy C, DO, 8.6 mg at 01/31/21 0850   sodium chloride flush (NS) 0.9 % injection 10-40 mL, 10-40 mL, Intracatheter, Q12H, Ostergard, Joyice Faster, MD, 20 mL at 01/30/21 2134   sodium chloride flush (NS) 0.9 % injection 10-40 mL, 10-40 mL, Intracatheter, PRN, Judith Part, MD   sodium chloride flush (NS) 0.9 % injection 3 mL, 3 mL, Intravenous, Q12H, Dawley, Troy C, DO, 3 mL at 01/29/21 1053   sodium chloride flush (NS) 0.9 % injection 3 mL, 3 mL, Intravenous, PRN, Dawley, Troy C, DO   tamsulosin (FLOMAX) capsule 0.4 mg, 0.4 mg, Oral, Daily, Newman Pies, MD, 0.4 mg at 01/31/21 0851   tiZANidine (ZANAFLEX) tablet 4 mg, 4 mg, Oral, Q6H PRN, Dawley, Troy C, DO, 4 mg at 01/31/21 5643   Patients Current Diet:  Diet Order                  Diet regular Room service appropriate? Yes; Fluid consistency: Thin  Diet effective now                       Precautions / Restrictions Precautions Precautions: Fall, Back, Other (comment) Precaution Booklet Issued: No Precaution Comments: wound vac Other Brace: no brace needed per order Restrictions Weight Bearing Restrictions: No    Has the patient had 2 or more falls or a fall with injury in the past year? No   Prior Activity Level Community (5-7x/wk): Independent, not worked since May   Prior Functional Level Self Care: Did the patient need help bathing, dressing, using the toilet or eating? Independent   Indoor Mobility: Did the patient need assistance with walking from room to room (with or without device)? Independent   Stairs: Did the patient need assistance with internal or external stairs (with or without device)? Independent   Functional Cognition: Did the patient need help planning regular  tasks such as shopping or remembering to take medications? Independent   Patient Information Are you of Hispanic, Latino/a,or Spanish origin?: A. No, not of Hispanic, Latino/a, or Spanish origin What is your race?: A. White Do you need or want an interpreter to communicate with a doctor or health care staff?: 0. No   Patient's Response To:  Health Literacy and Transportation Is the patient able to respond to health literacy and transportation needs?: Yes Health Literacy - How often do you need to have someone help you when you read instructions, pamphlets, or other written material from your doctor or pharmacy?: Never In the past 12 months, has lack of transportation kept you from medical appointments or from getting medications?: No In the past 12 months, has lack of transportation kept you from meetings, work, or from getting things needed for daily living?: No   Home Assistive Devices / [place] Devices/Equipment: Blood pressure cuff, CBG Meter, Eyeglasses   Prior Device Use: Indicate devices/aids used by the patient prior to current illness, exacerbation or injury? None of the above   Current Functional Level Cognition   Overall Cognitive Status: Within Functional Limits for tasks assessed Orientation Level: Oriented X4    Extremity Assessment (includes Sensation/Coordination)   Upper Extremity Assessment: Generalized weakness  Lower Extremity Assessment: Defer to PT evaluation     ADLs   Overall ADL's : Needs assistance/impaired Grooming: Wash/dry hands, Wash/dry face, Oral care, Set up, Sitting Upper Body Bathing: Set up, Sitting Lower Body Bathing: Maximal assistance, Sit to/from stand Upper Body Dressing : Set up, Sitting Lower Body  Dressing: Maximal assistance, Sit to/from stand Toilet Transfer: Min guard, RW, Ambulation Toileting- Clothing Manipulation and Hygiene: Maximal assistance, Sit to/from stand Functional mobility during ADLs: Rolling walker,  Cueing for safety, Min guard General ADL Comments: pt limtied by pain, weakness, and balance     Mobility   Overal bed mobility: Needs Assistance Bed Mobility: Rolling, Sidelying to Sit, Sit to Sidelying Rolling: Supervision Sidelying to sit: Supervision Sit to sidelying: Min assist General bed mobility comments: minA for bringing LEs back into bed. Use of rail     Transfers   Overall transfer level: Needs assistance Equipment used: Rolling walker (2 wheeled) Transfers: Sit to/from Stand Sit to Stand: Min assist, From elevated surface Stand pivot transfers: Min guard General transfer comment: minA from very elevated surface     Ambulation / Gait / Stairs / Wheelchair Mobility   Ambulation/Gait Ambulation/Gait assistance: Herbalist (Feet): 40 Feet Assistive device: Rolling walker (2 wheeled) Gait Pattern/deviations: Step-through pattern, Decreased stride length, Trunk flexed General Gait Details: heavy reliance on RW. MinA for balance and RW management. Cues for upright posture throughout Gait velocity: decreased Gait velocity interpretation: <1.31 ft/sec, indicative of household ambulator     Posture / Balance Balance Overall balance assessment: Needs assistance Sitting-balance support: No upper extremity supported, Feet supported Sitting balance-Leahy Scale: Fair Standing balance support: Bilateral upper extremity supported, During functional activity Standing balance-Leahy Scale: Poor Standing balance comment: Reliant on B UE support on the RW     Special needs/care consideration Home CPAP Wound VAC new this admit To be on 6 weeks IV antibiotics via PICC stop date 03/08/21    Previous Home Environment  Living Arrangements: Spouse/significant other  Lives With: Spouse, Daughter Available Help at Discharge: Family, Available 24 hours/day Type of Home: House Home Layout: One level Home Access: Stairs to enter Entrance Stairs-Rails: None Entrance  Stairs-Number of Steps: 3 Bathroom Shower/Tub: Chiropodist: Handicapped height Bathroom Accessibility: Yes How Accessible: Accessible via walker [place]: No   Discharge Living Setting Plans for Discharge Living Setting: Patient's home, Lives with (comment) Type of Home at Discharge: House Discharge Home Layout: One level Discharge Home Access: Stairs to enter Entrance Stairs-Rails: None Entrance Stairs-Number of Steps: 3 Discharge Bathroom Shower/Tub: Tub/shower unit Discharge Bathroom Toilet: Handicapped height Discharge Bathroom Accessibility: Yes How Accessible: Accessible via walker Does the patient have any problems obtaining your medications?: No   Social/Family/Support Systems Patient Roles: Spouse, Parent Contact Information: wife, [name] Anticipated Caregiver: Pt brother lives in the home Anticipated Caregiver's Contact Information: see above Ability/Limitations of Caregiver: Pt wife works during the day; brother will provide supervision during the day Caregiver Availability: 24/7 Discharge Plan Discussed with Primary Caregiver: Yes Is Caregiver In Agreement with Plan?: Yes Does Caregiver/Family have Issues with Lodging/Transportation while Pt is in Rehab?: No   Goals Patient/Family Goal for Rehab: Mod I to supervision with PT and OT Expected length of stay: ELOS 10 to 14 days Pt/Family Agrees to Admission and willing to participate: Yes Program Orientation Provided & Reviewed with Pt/Caregiver Including Roles  & Responsibilities: Yes   Decrease burden of Care through IP rehab admission: n/a   Possible need for SNF placement upon discharge: not anticipated   Patient Condition: I have reviewed medical records from [facility], spoken with CM, and patient and spouse. I met with patient at the bedside for inpatient rehabilitation assessment.  Patient will benefit from ongoing PT and OT, can actively participate in 3 hours of  therapy a day 5 days of the week, and can make measurable gains during the admission.  Patient will also benefit from the coordinated team approach during an Inpatient Acute Rehabilitation admission.  The patient will receive intensive therapy as well as Rehabilitation physician, nursing, social worker, and care management interventions.  Due to bladder management, bowel management, safety, skin/wound care, disease management, medication administration, pain management, and patient education the patient requires 24 hour a day rehabilitation nursing.  The patient is currently mod assist overall with mobility and basic ADLs.  Discharge setting and therapy post discharge at home with home health is anticipated.  Patient has agreed to participate in the Acute Inpatient Rehabilitation Program and will admit today.   Preadmission Screen Completed By:  Danne Baxter RN MSN with updates by Julious Payer, Audelia Acton, 01/31/2021 10:38 AM ______________________________________________________________________   Discussed status with Dr. Dagoberto Ligas on 01/31/21 at 1038 and received approval for admission today.   Admission Coordinator: Danne Baxter RN MSN  time [id number] Date  01/31/21    Assessment/Plan: Diagnosis: Does the need for close, 24 hr/day Medical supervision in concert with the patient's rehab needs make it unreasonable for this patient to be served in a less intensive setting? Yes Co-Morbidities requiring supervision/potential complications: lumbar stenosis s/p decompression/fusion with bacteremia s/p back wash out- VAC placed- post op pain; orthostatic hypotension; urinary retention Due to bladder management, bowel management, safety, skin/wound care, disease management, medication administration, pain management, and patient education, does the patient require 24 hr/day rehab nursing? Yes Does the patient require coordinated care of a physician, rehab nurse, PT, OT, and SLP to address physical and  functional deficits in the context of the above medical diagnosis(es)? Yes Addressing deficits in the following areas: balance, endurance, locomotion, strength, transferring, bowel/bladder control, bathing, dressing, feeding, grooming, and toileting Can the patient actively participate in an intensive therapy program of at least 3 hrs of therapy 5 days a week? Yes The potential for patient to make measurable gains while on inpatient rehab is good Anticipated functional outcomes upon discharge from inpatient rehab: modified independent and supervision PT, modified independent and supervision OT, n/a SLP Estimated rehab length of stay to reach the above functional goals is: 10-14 days Anticipated discharge destination: Home 10. Overall Rehab/Functional Prognosis: good     MD Signature:           Revision History                          Note Details  Jan Fireman, MD File Time 01/31/2021 10:58 AM  Author Type Physician Status Signed  Last Editor Courtney Heys, MD Service Physical Medicine and [place] # [id number] Admit Date 01/31/2021

## 2021-01-31 NOTE — Consult Note (Signed)
WOC Nurse Consult Note: LATE ENTRY: First post op dressing change performed 01/28/21.  Dr Arnoldo Morale was notified via secure chat but was in procedure and not at bedside for dressing change.  Photo was obtained during dressing change.  Note entered is not showing up in chart, late entry will be added.  Reason for Consult: NPWT (WOUND VAC) dressing change.   Wound type: Lumbar surgical wound Pressure Injury POA: NA Measurement: not available at time of late entry, were obtained on 10/17 and was re-measured by my partner at 01/30/21 dressing change.  Wound bed: Ruddy red  Drainage (amount, consistency, odor) Moderate serosanguinous Periwound: Medical adhesive related skin injury (MARSI) noted to right side of periwound skin at the site of the bridge.  I transferred over to left side of wound and protected with skin prep.  Dressing procedure/placement/frequency:3 pieces black foam to wound bed and 1 piece for bridge to periwound over skin protected by drape.  Seal immediately achieved at 125 mmHg, Change Mon/We/dFri.  Patient had ambulated to bathroom prior to procedure and remained sitting up on edge of bed for procedure.  Will follow.  Domenic Moras MSN, RN, FNP-BC CWON Wound, Ostomy, Continence Nurse Pager [phone]

## 2021-01-31 NOTE — Progress Notes (Signed)
Neurosurgery Service Progress Note  Subjective: No acute events overnight, no new complaints this morning  Objective: Vitals:   01/30/21 2030 01/30/21 2356 01/31/21 0412 01/31/21 0755  BP: 117/70 127/73 130/79 116/65  Pulse: (!) 110 (!) 106 (!) 101 (!) 103  Resp: 18 18 18 18   Temp: 98.4 F (36.9 C) 98.6 F (37 C) 98.2 F (36.8 C) 97.8 F (36.6 C)  TempSrc: Oral Oral Oral   SpO2: 96% 95% 95% 99%    Physical Exam: Strength 5/5 x4, SILTx4, incision intact w/ wound vac in place w/ good seal  Assessment & Plan: 55 y.o. man s/p L1-S1 decompression and PSIF with wound drainage s/p washout and vac placement, recovering well.  -PICC in place, continue cefazolin per ID recs for K pneumoniae, stop date 11/25 -wound vac changes MWF -discharge / transfer back to [place]  01/31/21 11:03 AM

## 2021-01-31 NOTE — H&P (Signed)
Physical Medicine and Rehabilitation Admission H&P     CC: Functional deficits due to lumbar radiculopathy/myelopathy.      HPI: [name] is a 54 year old male with history of T2DM, OSA, obesity. DDD with cervical and lumbar stenosis with RLE radiculopathy and significant pain limiting functional status since May 2022. He was found to have lumbar spondylosis with severe canal stenosis from L1-L2  to L4-L5.  He was admitted on 01/12/2019 for L1-S1 laminectomy with PLIF by Dr. Venetia Constable.  Hospital course was significant for issues with hypotension as well as lethargy, elevated CK with lactic acidosis, intermittent tachycardia as well as acute blood loss anemia with hyponatremia as well as drainage from back incision.  He was treated with IV fluids and hyponatremia felt to be dilutional due to fluid boluses.  His blood pressures were improving however he continued to be limited by orthostatic symptoms as well as pain with activity and poor p.o. intake.  Foley was placed due to urinary retention.he continued to be limited by pain, neuropathy, spasms BLE as well as weakness affecting mobility and ADLs. CIR was recommended due to functional decline.     He was admitted to inpatient rehab on 01/18/2021 for comprehensive intensive rehab.  He continued to have issues with abdominal and back pain with poor p.o. intake as well as ongoing serosanguineous drainage from superior aspect of wound.  He was started on doxycycline for wound prophylaxis and multiple supplements were added to help promote wound healing.  He had recurrent drop in sodium to 128 with AKI and was treated with IV fluids.  He developed hypotension with tachycardia and low-grade fevers due to SIRS on 10/10.  He was started on broad-spectrum antibiotics and pancultured with blood culture showing Kleb pneumonia.  Antibiotics narrowed to ceftriaxone per ID input.  2D echo was done and was negative for endocarditis.  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     Lumbar spine CT was done due to ongoing back drainage and showed fluid collection soft tissues and musculature posterior to L1/S1 felt to be postop seroma as well as soft tissue fluid collection extending from T10-S1.  Neurosurgery was contacted for input patient was discharged to acute hospital for wound washout and cultures. No purulence noted and wound culture was positive for Klebsiella. Dr. West Bali recommends 6 weeks IV cefazolin from OR date of 10/14 and to follow up on outpatient basis. Back pain is improving but patient continues to be limited by weakness with poor posture and fatigue affecting functional status. Therapy ongoing and he was cleared to resume CIR.   Pt reports LBM this AM- they put foley back in- because not voiding.  Pain is in low right back- lateral to incision- aching and throbbing; makes him sick to his stomach, esp when gets up.    Pain getting a little better- keeps moving to keep wound VAC from hurting so much.      Review of Systems  Constitutional:  Positive for malaise/fatigue. Negative for fever.  HENT: Negative.    Eyes: Negative.   Respiratory: Negative.  Negative for cough and shortness of breath.   Cardiovascular: Negative.   Gastrointestinal:  Negative for constipation, diarrhea and nausea.  Genitourinary:        Urinary retention  Musculoskeletal:  Positive for back pain and myalgias.  Skin:  Negative for itching and rash.  Neurological:  Positive for tingling, sensory change and weakness.  Endo/Heme/Allergies:  Negative.   Psychiatric/Behavioral:  Negative for hallucinations. The patient has insomnia.            Past Surgical History:  Procedure Laterality Date   APPLICATION OF ROBOTIC ASSISTANCE FOR SPINAL PROCEDURE N/A 01/11/2021    Procedure: APPLICATION OF ROBOTIC ASSISTANCE FOR SPINAL PROCEDURE;  Surgeon: Judith Part, MD;  Location: [facility];  Service: Neurosurgery;  Laterality: N/A;   BACK  SURGERY       COLONOSCOPY   2019   TRANSFORAMINAL LUMBAR INTERBODY FUSION (TLIF) WITH PEDICLE SCREW FIXATION 4 LEVEL N/A 01/11/2021    Procedure: Lumbar one-two, Lumbar two-three, Lumbar three-four, Lumbar four-five, Lumbar five Sacral one Open decompression, Transforaminal lumbar interbody fusion, posterolateral instrumented fusion;  Surgeon: Judith Part, MD;  Location: [facility];  Service: Neurosurgery;  Laterality: N/A;           Family History  Problem Relation Age of Onset   Diabetes Mother     Hypertension Neg Hx     Coronary artery disease Neg Hx     Colon cancer Neg Hx     Prostate cancer Neg Hx        Social History:  Married. Was independent and working prior to May 2022--has been limited by back pain/spasms. He reports that he has never smoked. He has never used smokeless tobacco. He reports that he does not drink alcohol and does not use drugs.          Allergies  Allergen Reactions   Cymbalta [Duloxetine Hcl] Other (See Comments)   Penicillins Other (See Comments)      REACTION: Blister and Sores in the mouth            Medications Prior to Admission  Medication Sig Dispense Refill   aspirin 81 MG tablet Take 81 mg by mouth daily.         blood glucose meter kit and supplies Use up to four times daily as directed. Dx: E11.9 1 each 0   Blood Glucose Monitoring Suppl ([place]) w/Device KIT Test blood sugar twice a day.  Dx code: E11.9 1 kit 0   fluticasone (FLONASE) 50 MCG/ACT nasal spray Place 2 sprays into both nostrils daily. (Patient taking differently: Place 2 sprays into both nostrils 2 (two) times daily.) 16 g 5   gabapentin (NEURONTIN) 600 MG tablet Take 1 tablet (600 mg total) by mouth 3 (three) times daily. 90 tablet 5   glucose blood (ONETOUCH VERIO) test strip Check blood sugars twice daily 200 strip 12   losartan-hydrochlorothiazide (HYZAAR) 100-12.5 MG tablet TAKE 1 TABLET BY MOUTH EVERY DAY 90 tablet 1   metFORMIN (GLUCOPHAGE) 1000  MG tablet Take 1 tablet (1,000 mg total) by mouth 2 (two) times daily with a meal. 180 tablet 1   NALTREXONE HCL PO Take 4 mg by mouth daily.       OneTouch Delica Lancets 92J MISC Test blood sugar twice a day.  Dx code: E11.9 200 each 12   oxyCODONE 10 MG TABS Take 1 tablet (10 mg total) by mouth every 4 (four) hours as needed for severe pain ((score 7 to 10)). 30 tablet 0   rosuvastatin (CRESTOR) 20 MG tablet Take 1 tablet (20 mg total) by mouth daily. 90 tablet 3   tadalafil (CIALIS) 20 MG tablet TAKE 1/2 TO 1 TABLET BY MOUTH DAILY AS NEEDED FOR ERECTILE DYSFUNCTION 30 tablet 3   tiZANidine (ZANAFLEX) 4 MG tablet Take 1 tablet (4 mg total) by mouth  every 6 (six) hours as needed for muscle spasms. 120 tablet 5      Drug Regimen Review  Drug regimen was reviewed and remains appropriate with no significant issues identified   Home: Home Living Family/patient expects to be discharged to:: Inpatient rehab Living Arrangements: Spouse/significant other Available Help at Discharge: Family, Available 24 hours/day Type of Home: House Home Access: Stairs to enter [employer] of Steps: 3 Entrance Stairs-Rails: None Home Layout: One level Bathroom Shower/Tub: Chiropodist: Handicapped height Bathroom Accessibility: Yes  Lives With: Spouse, Daughter   Functional History: Prior Function Level of Independence: Needs assistance Gait / Transfers Assistance Needed: reports ambulating 136' at CIR with assistance and RW ADL's / Homemaking Assistance Needed: needing assist for LB ADLs in rehab Comments: Prior to initial surgery, patient was independent but not working due to pain   Functional Status:  Mobility: Bed Mobility Overal bed mobility: Needs Assistance Bed Mobility: Rolling, Sidelying to Sit, Sit to Sidelying Rolling: Supervision Sidelying to sit: Supervision Sit to sidelying: Min assist General bed mobility comments: minA for bringing LEs back into bed.  Use of rail Transfers Overall transfer level: Needs assistance Equipment used: Rolling walker (2 wheeled) Transfers: Sit to/from Stand Sit to Stand: Min assist, From elevated surface Stand pivot transfers: Min guard General transfer comment: minA from very elevated surface Ambulation/Gait Ambulation/Gait assistance: Min assist Gait Distance (Feet): 40 Feet Assistive device: Rolling walker (2 wheeled) Gait Pattern/deviations: Step-through pattern, Decreased stride length, Trunk flexed General Gait Details: heavy reliance on RW. MinA for balance and RW management. Cues for upright posture throughout Gait velocity: decreased Gait velocity interpretation: <1.31 ft/sec, indicative of household ambulator   ADL: ADL Overall ADL's : Needs assistance/impaired Grooming: Wash/dry hands, Wash/dry face, Oral care, Set up, Sitting Upper Body Bathing: Set up, Sitting Lower Body Bathing: Maximal assistance, Sit to/from stand Upper Body Dressing : Set up, Sitting Lower Body Dressing: Maximal assistance, Sit to/from stand Toilet Transfer: Min guard, RW, Ambulation Toileting- Clothing Manipulation and Hygiene: Maximal assistance, Sit to/from stand Functional mobility during ADLs: Rolling walker, Cueing for safety, Min guard General ADL Comments: pt limtied by pain, weakness, and balance   Cognition: Cognition Overall Cognitive Status: Within Functional Limits for tasks assessed Orientation Level: Oriented X4 Cognition Arousal/Alertness: Awake/alert Behavior During Therapy: WFL for tasks assessed/performed Overall Cognitive Status: Within Functional Limits for tasks assessed     Blood pressure 129/84, pulse (!) 105, temperature 98.5 F (36.9 C), resp. rate 18, SpO2 98 %. Physical Exam Vitals and nursing note reviewed.  Constitutional:      General: He is not in acute distress.    Appearance: Normal appearance. He is obese.     Comments: Pale appearing. NAD Pt is an obese man; tall man;  supine in bed; NAD  HENT:     Head: Normocephalic and atraumatic.     Right Ear: External ear normal.     Left Ear: External ear normal.     Nose: Nose normal. No congestion.     Mouth/Throat:     Mouth: Mucous membranes are dry.     Pharynx: Oropharynx is clear. No oropharyngeal exudate.  Eyes:     General:        Right eye: No discharge.        Left eye: No discharge.     Extraocular Movements: Extraocular movements intact.  Cardiovascular:     Rate and Rhythm: Regular rhythm. Tachycardia present.     Heart sounds: Normal heart sounds.  No murmur heard.   No gallop.  Pulmonary:     Comments: CTA B/L- no W/R/R- good air movement Abdominal:     Tenderness: There is no abdominal tenderness.     Comments: Soft, NT, ND, (+)BS  - hypoactive  Genitourinary:    Comments: Foley in place- medium amber urine in bag Musculoskeletal:     Cervical back: Normal range of motion.     Comments: Pt has no muscle atrophy- UE strength 5/5 B/L  LE- HF 4/5; KE/KF 4+/5; Df/PF 4+/5  Skin:    General: Skin is warm and dry.     Comments: Wound VAC on back incision- less puffy when seen last- sanguinous drainag ein VAC container.  PICC in RUE  Neurological:     Mental Status: He is alert and oriented to person, place, and time.     Comments: Intact to light touch in all 4 extremities  Psychiatric:     Comments: Flat but appropriate      Lab Results Last 48 Hours  No results found for this or any previous visit (from the past 48 hour(s)).     Imaging Results (Last 48 hours)  No results found.           Medical Problem List and Plan: 1.  Lumbar radiculopathy/myelopathy secondary to nerve root compression- s/p decompression and wash out after bacteremia from klebsiella pneumonia             -patient may not shower til wound VAC removed             -ELOS/Goals: 10-14 days mod I to supervision 2.  Antithrombotics: -DVT/anticoagulation:  Pharmaceutical: Lovenox             -antiplatelet  therapy: N/A 3. Pain Management:  Continue Oxycodone 15 mg prn. -- Will resume MSContin for more consistent pain relief. Schedule Tizanidine TID. Resume muscle rug to right thigh as it was helping before.              --Change IV dilaudid to prior to VAC changes.   4. Mood: Team to provide ego support. LCSW to follow for evaluation and support.              -antipsychotic agents: N/A 5. Neuropsych: This patient is capable of making decisions on his own behalf. 6. Skin/Wound Care:  Monitor wound daily. Will add vitamins and protein supplement to help promote wound healing.  7. Fluids/Electrolytes/Nutrition: Monitor I/O. Check CMET in am.  8. Klebsiella bacteremia w/diskitis: On Cefazolin 2 g every 8 hrs with end date 03/11/21             --CRP down to 7.9 and procalcitonin down to 80.59 -- Check weekly CRP/ESR.  9. HTN: Monitor BP tid--continue Microzide and cozaar daily. 10. T2DM: Hgb A1c-  CM diet. On Metformin 1000 mg bid 11. Neurogenic bladder: On flomax. Will increase to 0.8 mg nightly-  13. Neuropathy: On Neurontin 600 mg TID.  14. ABLA: monitor for signs of bleeding. Hgb stabilizing to 8 range.  --Will continue to monitor H/H for stability.  15. 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 -- Follow up with surgery after discharge.  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  17. Abdominal pain: Much better--dysesthesias?  18. Hypomagnesemia: Resume Mag Ox. Check Mg level in am.       I have personally performed a face to face diagnostic evaluation of this patient and formulated the key components  of the plan.  Additionally, I have personally reviewed laboratory data, imaging studies, as well as relevant notes and concur with the physician assistant's documentation above.   The patient's status has not changed from the original H&P.  Any changes in documentation from the acute care chart have been noted above.       Bary Leriche, PA-C 01/31/2021

## 2021-01-31 NOTE — Discharge Summary (Signed)
Discharge Summary  Date of Admission: 01/25/2021  Date of Discharge: 01/31/21  Attending Physician: Emelda Brothers, MD  Hospital Course: Patient was admitted from rehab due to persistent drainage from his wound and Bcx with Klebsielle pneumoniae. He was taken to the OR for a wound washout, cultures from the OR grew Klebsiella but no significant purulence was noted. A wound vac was placed, the patient was recovered in PACU and transferred to 4NP. His hospital course was uncomplicated and the patient was discharged back to CIR on 01/31/21. He will follow up in clinic with me in 2 weeks.  Neurologic exam at discharge:  AOx3, PERRL, EOMI, FS, TM Strength 5/5 x4, SILTx4  Discharge diagnosis: Post-operative wound infection  Judith Part, MD 01/31/21 11:06 AM

## 2021-01-31 NOTE — Progress Notes (Signed)
Inpatient Rehabilitation Admissions Coordinator   CIR bed is available to admit to today. I contacted Dr Joaquim Nam to arrange, Acute team and TOC made aware. I met at bedside with patient and he is in agreement,  Danne Baxter, RN, MSN Rehab Admissions Coordinator [phone] 01/31/2021 10:29 AM

## 2021-01-31 NOTE — Progress Notes (Signed)
Inpatient Rehabilitation Admission Medication Review by a Pharmacist  A complete drug regimen review was completed for this patient to identify any potential clinically significant medication issues.  High Risk Drug Classes Is patient taking? Indication by Medication  Antipsychotic No   Anticoagulant Yes VTE prophylaxis: Enoxaparin  Antibiotic Yes Klebsiella bacteremia w/diskitis:Cefazolin IV   Opioid Yes Pain management: morphine, oxycodone, hydromorphone prior to [facility] changes   Antiplatelet No   Hypoglycemics/insulin Yes Diabetes: Metformin  Vasoactive Medication No   Chemotherapy No   Other Yes HLD: Rosuvastatin Neuropathy: gabapentin Neurogenic bladder: Flomax HTN: HCTZ, losartan For Sleep: Trazodone Pain/muscle relaxant: Tizanidine     Type of Medication Issue Identified Description of Issue Recommendation(s)  Drug Interaction(s) (clinically significant)     Duplicate Therapy     Allergy     No Medication Administration End Date     Incorrect Dose     Additional Drug Therapy Needed     Significant med changes from prior encounter (inform family/care partners about these prior to discharge).    Other       Clinically significant medication issues were identified that warrant physician communication and completion of prescribed/recommended actions by midnight of the next day:  No  Name of provider notified for urgent issues identified: n/a  Provider Method of Notification: n/a   Pharmacist comments: n/a  Time spent performing this drug regimen review (minutes):  [place], Carmen Clinical Pharmacist  01/31/2021 4:44 PM

## 2021-01-31 NOTE — Consult Note (Signed)
[facility] Nurse Consult Note: Reason for Consult:NPWT dressing changes every M/W/F. Patient known to our team; dressing last changed on Wednesday, 01/30/21 in acute care setting.  Next dressing change is scheduled for Friday, 02/01/21. [facility] Nurses will perform. Wound type: Surgical  Pressure Injury POA: N/A  [facility] nursing team will follow, and will remain available to this patient, the nursing and medical teams.  Thanks, Maudie Flakes, MSN, RN, [place], Arther Abbott  Pager# [phone]

## 2021-02-01 ENCOUNTER — Encounter (HOSPITAL_COMMUNITY): Payer: Self-pay | Admitting: Neurological Surgery

## 2021-02-01 DIAGNOSIS — E1165 Type 2 diabetes mellitus with hyperglycemia: Secondary | ICD-10-CM

## 2021-02-01 DIAGNOSIS — G8928 Other chronic postprocedural pain: Secondary | ICD-10-CM

## 2021-02-01 DIAGNOSIS — I10 Essential (primary) hypertension: Secondary | ICD-10-CM

## 2021-02-01 DIAGNOSIS — E46 Unspecified protein-calorie malnutrition: Secondary | ICD-10-CM

## 2021-02-01 DIAGNOSIS — D62 Acute posthemorrhagic anemia: Secondary | ICD-10-CM

## 2021-02-01 DIAGNOSIS — E871 Hypo-osmolality and hyponatremia: Secondary | ICD-10-CM

## 2021-02-01 DIAGNOSIS — G8918 Other acute postprocedural pain: Secondary | ICD-10-CM

## 2021-02-01 DIAGNOSIS — E8809 Other disorders of plasma-protein metabolism, not elsewhere classified: Secondary | ICD-10-CM

## 2021-02-01 LAB — COMPREHENSIVE METABOLIC PANEL
ALT: 13 U/L (ref 0–44)
AST: 14 U/L — ABNORMAL LOW (ref 15–41)
Albumin: 2.3 g/dL — ABNORMAL LOW (ref 3.5–5.0)
Alkaline Phosphatase: 84 U/L (ref 38–126)
Anion gap: 11 (ref 5–15)
BUN: 9 mg/dL (ref 6–20)
CO2: 27 mmol/L (ref 22–32)
Calcium: 8.6 mg/dL — ABNORMAL LOW (ref 8.9–10.3)
Chloride: 94 mmol/L — ABNORMAL LOW (ref 98–111)
Creatinine, Ser: 0.82 mg/dL (ref 0.61–1.24)
GFR, Estimated: >60 mL/min (ref >60)
Glucose, Bld: 163 mg/dL — ABNORMAL HIGH (ref 70–99)
Potassium: 3.5 mmol/L (ref 3.5–5.1)
Sodium: 132 mmol/L — ABNORMAL LOW (ref 135–145)
Total Bilirubin: 0.7 mg/dL (ref 0.3–1.2)
Total Protein: 6.2 g/dL — ABNORMAL LOW (ref 6.5–8.1)

## 2021-02-01 LAB — CBC WITH DIFFERENTIAL/PLATELET
Abs Immature Granulocytes: 0.07 10*3/uL (ref 0.00–0.07)
Basophils Absolute: 0.1 10*3/uL (ref 0.0–0.1)
Basophils Relative: 1 %
Eosinophils Absolute: 0.5 10*3/uL (ref 0.0–0.5)
Eosinophils Relative: 6 %
HCT: 27.2 % — ABNORMAL LOW (ref 39.0–52.0)
Hemoglobin: 8.9 g/dL — ABNORMAL LOW (ref 13.0–17.0)
Immature Granulocytes: 1 %
Lymphocytes Relative: 11 %
Lymphs Abs: 1 10*3/uL (ref 0.7–4.0)
MCH: 28.9 pg (ref 26.0–34.0)
MCHC: 32.7 g/dL (ref 30.0–36.0)
MCV: 88.3 fL (ref 80.0–100.0)
Monocytes Absolute: 0.6 10*3/uL (ref 0.1–1.0)
Monocytes Relative: 7 %
Neutro Abs: 6.7 10*3/uL (ref 1.7–7.7)
Neutrophils Relative %: 74 %
Platelets: 386 10*3/uL (ref 150–400)
RBC: 3.08 MIL/uL — ABNORMAL LOW (ref 4.22–5.81)
RDW: 14 % (ref 11.5–15.5)
WBC: 8.9 10*3/uL (ref 4.0–10.5)
nRBC: 0 % (ref 0.0–0.2)

## 2021-02-01 LAB — CULTURE, BLOOD (ROUTINE X 2)
Culture: NO GROWTH
Culture: NO GROWTH
Special Requests: ADEQUATE
Special Requests: ADEQUATE

## 2021-02-01 LAB — MAGNESIUM: Magnesium: 1.6 mg/dL — ABNORMAL LOW (ref 1.7–2.4)

## 2021-02-01 LAB — GLUCOSE, CAPILLARY
Glucose-Capillary: 156 mg/dL — ABNORMAL HIGH (ref 70–99)
Glucose-Capillary: 232 mg/dL — ABNORMAL HIGH (ref 70–99)
Glucose-Capillary: 183 mg/dL — ABNORMAL HIGH (ref 70–99)
Glucose-Capillary: 143 mg/dL — ABNORMAL HIGH (ref 70–99)

## 2021-02-01 MED ORDER — 0.9 % SODIUM CHLORIDE (POUR BTL) OPTIME
TOPICAL | Status: DC | PRN
  Administered 2021-01-25: 1000 mL

## 2021-02-01 MED ORDER — THROMBIN 5000 UNITS EX SOLR: OROMUCOSAL | Status: DC | PRN

## 2021-02-01 NOTE — Evaluation (Signed)
Occupational Therapy Assessment and Plan  Patient Details  Name: [name] MRN: [id number] Date of Birth: 1965/08/28  OT Diagnosis: abnormal posture, acute pain, lumbago (low back pain), muscle weakness (generalized), and pain in joint Rehab Potential: Rehab Potential (ACUTE ONLY): Good ELOS: 10 to 14 days   Today's Date: 02/01/2021 OT Individual Time: 1000-1045 OT Individual Time Calculation (min): 45 min     Hospital Problem: Principal Problem:   Lumbar disc herniation with myelopathy   Past Medical History:  Past Medical History:  Diagnosis Date   DDD (degenerative disc disease), lumbar    Diabetes mellitus    Hyperlipemia    Hypertension    Obesity    OSA (obstructive sleep apnea)    on CPAP   Past Surgical History:  Past Surgical History:  Procedure Laterality Date   APPLICATION OF ROBOTIC ASSISTANCE FOR SPINAL PROCEDURE N/A 01/11/2021   Procedure: APPLICATION OF ROBOTIC ASSISTANCE FOR SPINAL PROCEDURE;  Surgeon: Judith Part, MD;  Location: [place];  Service: Neurosurgery;  Laterality: N/A;   BACK SURGERY     COLONOSCOPY  2019   TRANSFORAMINAL LUMBAR INTERBODY FUSION (TLIF) WITH PEDICLE SCREW FIXATION 4 LEVEL N/A 01/11/2021   Procedure: Lumbar one-two, Lumbar two-three, Lumbar three-four, Lumbar four-five, Lumbar five Sacral one Open decompression, Transforaminal lumbar interbody fusion, posterolateral instrumented fusion;  Surgeon: Judith Part, MD;  Location: [place];  Service: Neurosurgery;  Laterality: N/A;    Assessment & Plan Clinical Impression: 55 y.o. M admitted on 9/30 for L1-2, L2-3, L3-4, L4-5, L5-S1 laminectomies, transforaminal lumbar interbody fusion. CIR 10/7 -10/14. S/P I&D and placement of wound vac 10/14 d/t wound infection.   PMH significant for DDD, DM2, HTN.  Precautions: fall, back- no brace needed, wound vac   Patient currently requires min with basic self-care skills secondary to muscle weakness, decreased cardiorespiratoy  endurance, and decreased standing balance, decreased postural control, decreased balance strategies, and difficulty maintaining precautions.  Prior to hospitalization, patient could complete BADL/IADL with independent .  Patient will benefit from skilled intervention to decrease level of assist with basic self-care skills and increase independence with basic self-care skills prior to discharge home with care partner.  Anticipate patient will require intermittent supervision and follow up home health.  OT - End of Session Activity Tolerance: Tolerates 30+ min activity with multiple rests Endurance Deficit: Yes Endurance Deficit Description: Primarily limited by pain, requires to be seated to complete ADL OT Assessment Rehab Potential (ACUTE ONLY): Good OT Barriers to Discharge: Other (comments);Wound Care OT Barriers to Discharge Comments: pain OT Patient demonstrates impairments in the following area(s): Balance;Endurance;Pain;Skin Integrity OT Basic ADL's Functional Problem(s): Grooming;Bathing;Dressing;Toileting OT Advanced ADL's Functional Problem(s): Simple Meal Preparation OT Transfers Functional Problem(s): Toilet OT Plan OT Intensity: Minimum of 1-2 x/day, 45 to 90 minutes OT Frequency: 5 out of 7 days OT Duration/Estimated Length of Stay: 10 to 14 days OT Treatment/Interventions: Balance/vestibular training;Community reintegration;Disease mangement/prevention;Neuromuscular re-education;Patient/family education;Self Care/advanced ADL retraining;Therapeutic Exercise;UE/LE Coordination activities;Wheelchair propulsion/positioning;UE/LE Strength taining/ROM;Therapeutic Activities;Psychosocial support;Pain management;Functional mobility training;DME/adaptive equipment instruction;Discharge planning;Cognitive remediation/compensation OT Self Feeding Anticipated Outcome(s): ind OT Basic Self-Care Anticipated Outcome(s): mod I OT Toileting Anticipated Outcome(s): mod I OT Bathroom Transfers  Anticipated Outcome(s): mod I toilet; S shower OT Recommendation Therapeutic Recreation Interventions: Pet therapy;Stress management Patient destination: Home Follow Up Recommendations: Outpatient OT Equipment Recommended: 3 in 1 bedside comode;Tub/shower bench   OT Evaluation Precautions/Restrictions  Precautions Precautions: Fall;Back;Other (comment) Precaution Booklet Issued: No Precaution Comments: wound vac Required Braces or Orthoses: Other Brace Other Brace:  no brace needed per order Restrictions Weight Bearing Restrictions: No General Chart Reviewed: Yes OT Amount of Missed Time: 15 Minutes Family/Caregiver Present: No Vital Signs  Pain Pain Assessment Pain Scale: 0-10 Pain Score: 10-Worst pain ever Pain Type: Acute pain;Surgical pain Pain Location: Back Pain Orientation: Lower Pain Descriptors / Indicators: Aching;Sharp Pain Frequency: Intermittent Pain Onset: On-going Patients Stated Pain Goal: 5 Pain Intervention(s): Medication (See eMAR);Rest;Repositioned Multiple Pain Sites: No Home Living/Prior Functioning Home Living Available Help at Discharge: Family, Available 24 hours/day Type of Home: House Home Access: Stairs to enter [employer] of Steps: 3 Entrance Stairs-Rails: None Home Layout: One level Bathroom Shower/Tub: Chiropodist: Handicapped height Bathroom Accessibility: Yes  Lives With: Spouse, Daughter IADL History Homemaking Responsibilities: Yes 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 Function Level of Independence: Independent with basic ADLs, Independent with gait, Independent with transfers  Able to Take Stairs?: Yes Driving: Yes Vocation: On disability Comments: Prior to initial surgery, patient was independent but not working due to pain Vision Baseline Vision/History: 1 Wears glasses Ability to See in Adequate Light: 0 Adequate Patient Visual Report: No change from  baseline Vision Assessment?: No apparent visual deficits Perception  Perception: Within Functional Limits Praxis Praxis: Intact Cognition Overall Cognitive Status: Within Functional Limits for tasks assessed Arousal/Alertness: Awake/alert Person: Oriented Place: Oriented Situation: Oriented Year: 2022 Month: October Day of Week: Correct Memory: Appears intact Immediate Memory Recall: Sock;Blue;Bed Memory Recall Sock: Without Cue Memory Recall Blue: Without Cue Memory Recall Bed: Without Cue Safety/Judgment: Appears intact Sensation Sensation Light Touch: Appears Intact Hot/Cold: Appears Intact Proprioception: Appears Intact Stereognosis: Appears Intact Coordination Gross Motor Movements are Fluid and Coordinated: No Motor  Motor Motor: Within Functional Limits;Other (comment) Motor - Skilled Clinical Observations: RLE weakness only present with severe increases in pain otherwise [facility]  Trunk/Postural Assessment  Cervical Assessment Cervical Assessment:  (forward head) Thoracic Assessment Thoracic Assessment:  (back precautions) Lumbar Assessment Lumbar Assessment:  (back precautions) Postural Control Postural Control: Within Functional Limits  Balance Static Sitting Balance Static Sitting - Level of Assistance: 5: Stand by assistance Dynamic Sitting Balance Dynamic Sitting - Level of Assistance: 5: Stand by assistance Static Standing Balance Static Standing - Level of Assistance: 4: Min assist Extremity/Trunk Assessment RUE Assessment RUE Assessment: Within Functional Limits    Care Tool Care Tool Self Care Eating   Eating Assist Level: Independent    Oral Care    Oral Care Assist Level: Set up assist    Bathing   Body parts bathed by patient: Right arm;Left arm;Chest;Abdomen;Front perineal area;Right upper leg;Left upper leg;Buttocks;Face Body parts bathed by helper: Right lower leg;Left lower leg   Assist Level: Minimal Assistance - Patient > 75%     Upper Body Dressing(including orthotics)   What is the patient wearing?: Hospital gown only   Assist Level: Minimal Assistance - Patient > 75%    Lower Body Dressing (excluding footwear)   What is the patient wearing?: Pants Assist for lower body dressing: Minimal Assistance - Patient > 75%    Putting on/Taking off footwear   What is the patient wearing?: Non-skid slipper socks;Shoes Assist for footwear: Dependent - Patient 0%       Care Tool Toileting Toileting activity         Care Tool Bed Mobility Roll left and right activity        Sit to lying activity        Lying to sitting on side of bed activity  Care Tool Transfers Sit to stand transfer   Sit to stand assist level: Minimal Assistance - Patient > 75%    Chair/bed transfer   Chair/bed transfer assist level: Minimal Assistance - Patient > 75%     Toilet transfer   Assist Level: Minimal Assistance - Patient > 75%     Care Tool Cognition  Expression of Ideas and Wants Expression of Ideas and Wants: 4. Without difficulty (complex and basic) - expresses complex messages without difficulty and with speech that is clear and easy to understand  Understanding Verbal and Non-Verbal Content Understanding Verbal and Non-Verbal Content: 4. Understands (complex and basic) - clear comprehension without cues or repetitions   Memory/Recall Ability Memory/Recall Ability : Current season;Location of own room;Staff names and faces;That he or she is in a hospital/hospital unit   Refer to Care Plan for [place] 1 OT Short Term Goal 1 (Week 1): Pt will don B shoes wiht MIN cuing for AE technique OT Short Term Goal 2 (Week 1): Pt will complete ambulatory toilet transfer with CGA consistently + LRAD. OT Short Term Goal 3 (Week 1): Pt will bathe wiht S  Recommendations for other services: Therapeutic Recreation  Stress management   Skilled Therapeutic Intervention 1:1. Pt received in bed  requesting new walker. OT retreves new walker and pt completes functional mobility into bathroom with MIN A to maintain balance and A to manage IV pole, RW with wound vac on and VC for hand placement on RW. Pt unable to void, "Due to pain" Pt completes bathing and dressing at EOB as stated below. Provided reacher as this was something reviewed in previous stay. Education around AE for shoes, however pt declines practicing d/t pain. Pt transitions to recliner and set up with pillows for pain relief. Pt able to groom with set up. Pt missed 15 min skilled OT d/t pain. RN aware of pain level.   ADL ADL Eating: Independent Grooming: Setup Where Assessed-Grooming: Sitting at sink Upper Body Bathing: Setup Where Assessed-Upper Body Bathing: Edge of bed Lower Body Bathing: Moderate assistance Where Assessed-Lower Body Bathing: Edge of bed Where Assessed-Upper Body Dressing: Sitting at sink Lower Body Dressing: Moderate assistance (reacher for pants. needs A with shoes) Toileting: Minimal assistance Where Assessed-Toileting: Toilet;Bedside Commode Toilet Transfer: Minimal assistance Toilet Transfer Method: Counselling psychologist: Extra wide bedside commode;Grab bars Tub/Shower Transfer: Not assessed Mobility  Bed Mobility Rolling Left: Supervision/Verbal cueing Left Sidelying to Sit: Supervision/Verbal cueing Transfers Sit to Stand: Minimal Assistance - Patient > 75% Stand to Sit: Minimal Assistance - Patient > 75%   Discharge Criteria: Patient will be discharged from OT if patient refuses treatment 3 consecutive times without medical reason, if treatment goals not met, if there is a change in medical status, if patient makes no progress towards goals or if patient is discharged from hospital.  The above assessment, treatment plan, treatment alternatives and goals were discussed and mutually agreed upon: by patient  Tonny Branch 02/01/2021, 11:00 AM

## 2021-02-01 NOTE — IPOC Note (Signed)
Individualized overall Plan of Care [facility]) Patient Details Name: [name] MRN: [id number] DOB: [birth year]  Admitting Diagnosis: Lumbar disc herniation with myelopathy  Hospital Problems: Principal Problem:   Lumbar disc herniation with myelopathy Active Problems:   Hypoalbuminemia due to protein-calorie malnutrition (HCC)   Hyponatremia   Hypomagnesemia   Acute blood loss anemia   Postoperative pain     Functional Problem List: Nursing Nutrition, Bladder, Bowel, Pain, Safety, Edema, Endurance, Medication Management, Skin Integrity, Motor  PT Balance, Safety, Sensory, Endurance, Skin Integrity, Pain  OT Balance, Endurance, Pain, Skin Integrity  SLP    TR         Basic ADL's: OT Grooming, Bathing, Dressing, Toileting     Advanced  ADL's: OT Simple Meal Preparation     Transfers: PT Bed Mobility, Bed to Chair, Furniture  OT Toilet     Locomotion: PT Ambulation, Stairs, Wheelchair Mobility     Additional Impairments: OT    SLP        TR      Anticipated Outcomes Item Anticipated Outcome  Self Feeding ind  Swallowing      Basic self-care  mod I  Toileting  mod I   Bathroom Transfers mod I toilet; S shower  Bowel/Bladder  continent x 2  Transfers  supervision  Locomotion  supervision short distance gait  Communication     Cognition     Pain  less than 3  Safety/Judgment  remain fall free   Therapy Plan: PT Intensity: Minimum of 1-2 x/day ,45 to 90 minutes PT Frequency: 5 out of 7 days PT Duration Estimated Length of Stay: 2-3 weeks OT Intensity: Minimum of 1-2 x/day, 45 to 90 minutes OT Frequency: 5 out of 7 days OT Duration/Estimated Length of Stay: 10 to 14 days      Team Interventions: Nursing Interventions Patient/Family Education, Disease Management/Prevention, Skin Care/Wound Management, Discharge Planning, Bladder Management, Pain Management, Psychosocial Support, Bowel Management, Medication Management  PT interventions  Ambulation/gait training, Community reintegration, DME/adaptive equipment instruction, Neuromuscular re-education, Psychosocial support, Stair training, UE/LE Strength taining/ROM, Wheelchair propulsion/positioning, UE/LE Coordination activities, Therapeutic Exercise, Patient/family education, Functional mobility training, Disease management/prevention, Cognitive remediation/compensation, Training and development officer, Discharge planning, Functional electrical stimulation, Pain management, Skin care/wound management, Therapeutic Activities  OT Interventions Training and development officer, Community reintegration, Disease mangement/prevention, Neuromuscular re-education, Patient/family education, Self Care/advanced ADL retraining, Therapeutic Exercise, UE/LE Coordination activities, Wheelchair propulsion/positioning, UE/LE Strength taining/ROM, Therapeutic Activities, Psychosocial support, Pain management, Functional mobility training, DME/adaptive equipment instruction, Discharge planning, Cognitive remediation/compensation  SLP Interventions    TR Interventions    SW/CM Interventions     Barriers to Discharge MD  Medical stability, Wound care, and Weight  Nursing Decreased caregiver support, IV antibiotics, Wound Care, Incontinence, Neurogenic Bowel & Bladder, Weight, Home environment access/layout Lives in 1 level home with 3 steps to enter, no rails. Lives with spouse who works at [employer]. Spouse available to provide 24/7 assist at discharge  PT Inaccessible home environment, Decreased caregiver support, Home environment access/layout, Wound Care, Lack of/limited family support, Insurance for SNF coverage, Weight, Medication compliance Pt is limited by pain  OT Other (comments), Wound Care pain  SLP      SW       Team Discharge Planning: Destination: PT-Home ,OT- Home , SLP-  Projected Follow-up: PT-Outpatient PT, Home health PT, OT-  Outpatient OT, SLP-  Projected Equipment Needs: PT-To be  determined, OT- 3 in 1 bedside comode, Tub/shower bench, SLP-  Equipment Details: PT- , OT-  Patient/family  involved in discharge planning: PT- Patient,  OT-Patient, SLP-   MD ELOS: 12-16 days. Medical Rehab Prognosis:  Good Assessment: 55 year old male with history of T2DM, OSA, obesity. DDD with cervical and lumbar stenosis with RLE radiculopathy and significant pain limiting functional status since May 2022. He was found to have lumbar spondylosis with severe canal stenosis from L1-L2  to L4-L5.  He was admitted on 01/12/2019 for L1-S1 laminectomy with PLIF by Dr. Venetia Constable.  Hospital course was significant for issues with hypotension as well as lethargy, elevated CK with lactic acidosis, intermittent tachycardia as well as acute blood loss anemia with hyponatremia as well as drainage from back incision.  He was treated with IV fluids and hyponatremia felt to be dilutional due to fluid boluses.  His blood pressures were improving however he continued to be limited by orthostatic symptoms as well as pain with activity and poor p.o. intake.  Foley was placed due to urinary retention.he continued to be limited by pain, neuropathy, spasms BLE as well as weakness affecting mobility and ADLs. CIR was recommended due to functional decline.     He was admitted to inpatient rehab on 01/18/2021 for comprehensive intensive rehab.  He continued to have issues with abdominal and back pain with poor p.o. intake as well as ongoing serosanguineous drainage from superior aspect of wound.  He was started on doxycycline for wound prophylaxis and multiple supplements were added to help promote wound healing.  He had recurrent drop in sodium to 128 with AKI and was treated with IV fluids.  He developed hypotension with tachycardia and low-grade fevers due to SIRS on 10/10.  He was started on broad-spectrum antibiotics and pancultured with blood culture showing Kleb pneumonia.  Antibiotics narrowed to ceftriaxone per ID  input.  2D echo was done and was negative for endocarditis.  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     Lumbar spine CT was done due to ongoing back drainage and showed fluid collection soft tissues and musculature posterior to L1/S1 felt to be postop seroma as well as soft tissue fluid collection extending from T10-S1.  Neurosurgery was contacted for input patient was discharged to acute hospital for wound washout and cultures. No purulence noted and wound culture was positive for Klebsiella. Dr. West Bali recommends 6 weeks IV cefazolin from OR date of 10/14 and to follow up on outpatient basis. Back pain is improving but patient continues to be limited by weakness with poor posture and fatigue affecting functional status. Therapy ongoing and he was cleared to resume CIR. Will set goals for Mod I/Supervision with PT/OT.  Due to the current state of emergency, patients may not be receiving their 3-hours of Medicare-mandated therapy.  See Team Conference Notes for weekly updates to the plan of care

## 2021-02-01 NOTE — Progress Notes (Signed)
Occupational Therapy Note  Patient Details  Name: [name] MRN: [id number] Date of Birth: Jan 28, 1966  Today's Date: 02/01/2021 OT Missed Time: 23 Minutes Missed Time Reason: Pain  Pt missed 60 mins skilled OT services. Pt reports pain of 12/10 in back. Pain meds requested of RN.    Leotis Shames [facility] 02/01/2021, 2:27 PM

## 2021-02-01 NOTE — Progress Notes (Addendum)
Patient ID: [name], male   DOB: 1966-03-27, 55 y.o.   MRN: [id number]  This SW is familiar with patient as pt is a return patient. Pt was initially admitted to CIR on 01/18/2021. Due to medical concerns, pt went to acute hospital for surgery on 10/14 for I/D of lumbar wound. Please see assessment completed on 10/10. SW will continue to assess patient and follow for discharge needs.   SW submitted tentative referral to [facility] for wound vac if needed at time of discharge.   Loralee Pacas, MSW, [place] Office: [phone] Cell: [phone] Fax: [phone]

## 2021-02-01 NOTE — Progress Notes (Signed)
[place] PHYSICAL MEDICINE & REHABILITATION PROGRESS NOTE  Subjective/Complaints: Patient seen sitting up at the edge of his bed this morning while wound VAC is being changed.  He states he did not sleep well overnight due to back pain.  Wound VAC with significant drainage.  He notes his Foley was DC'd this morning.  He is ready to begin therapies.  ROS: Denies CP, SOB, N/V/D  Objective: Vital Signs: Blood pressure 122/67, pulse (!) 110, temperature 98.1 F (36.7 C), resp. rate 17, height _0  (1.88 m), weight (!) 142.3 kg, SpO2 98 %. No results found. Recent Labs    02/01/21 0503  WBC 8.9  HGB 8.9*  HCT 27.2*  PLT 386   Recent Labs    02/01/21 0503  NA 132*  K 3.5  CL 94*  CO2 27  GLUCOSE 163*  BUN 9  CREATININE 0.82  CALCIUM 8.6*    Intake/Output Summary (Last 24 hours) at 02/01/2021 1114 Last data filed at 02/01/2021 0732 Gross per 24 hour  Intake 657 ml  Output 1050 ml  Net -393 ml        Physical Exam: BP 122/67 (BP Location: Left Arm)   Pulse (!) 110   Temp 98.1 F (36.7 C)   Resp 17   Ht _1  (1.88 m)   Wt (!) 142.3 kg   SpO2 98%   BMI 40.28 kg/m  Constitutional: No distress . Vital signs reviewed.  Morbidly obese. HENT: Normocephalic.  Atraumatic. Eyes: EOMI. No discharge. Cardiovascular: No JVD.  + Tachycardia. Respiratory: Normal effort.  No stridor.  Bilateral clear to auscultation. GI: Non-distended.  BS +. Skin: Warm and dry.  + Wound VAC to back Psych: Normal mood.  Normal behavior. Musc: No edema in extremities.  No tenderness in extremities. Neuro: Alert Motor: Bilateral upper extremities: 5/5 proximal distal LE- HF 4/5; KE/KF 4+/5; Df/PF 4+/5   Assessment/Plan: 1. Functional deficits which require 3+ hours per day of interdisciplinary therapy in a comprehensive inpatient rehab setting. Physiatrist is providing close team supervision and 24 hour management of active medical problems listed below. Physiatrist and rehab team  continue to assess barriers to discharge/monitor patient progress toward functional and medical goals   Care Tool:  Bathing    Body parts bathed by patient: Right arm, Left arm, Chest, Abdomen, Front perineal area, Right upper leg, Left upper leg, Buttocks, Face   Body parts bathed by helper: Right lower leg, Left lower leg     Bathing assist Assist Level: Minimal Assistance - Patient > 75%     Upper Body Dressing/Undressing Upper body dressing   What is the patient wearing?: Hospital gown only    Upper body assist Assist Level: Minimal Assistance - Patient > 75%    Lower Body Dressing/Undressing Lower body dressing      What is the patient wearing?: Pants     Lower body assist Assist for lower body dressing: Minimal Assistance - Patient > 75%     Toileting Toileting    Toileting assist Assist for toileting: 2 Helpers     Transfers Chair/bed transfer  Transfers assist     Chair/bed transfer assist level: Minimal Assistance - Patient > 75%     Locomotion Ambulation   Ambulation assist              Walk 10 feet activity   Assist           Walk 50 feet activity   Assist  Walk 150 feet activity   Assist           Walk 10 feet on uneven surface  activity   Assist           Wheelchair     Assist               Wheelchair 50 feet with 2 turns activity    Assist            Wheelchair 150 feet activity     Assist           Medical Problem List and Plan: 1.  Lumbar radiculopathy/myelopathy secondary to nerve root compression- s/p decompression and wash out after bacteremia from klebsiella pneumonia Begin CIR evaluations  2.  Antithrombotics: -DVT/anticoagulation:  Pharmaceutical: Lovenox             -antiplatelet therapy: N/A 3. Pain Management:  Continue Oxycodone 15 mg prn. Resumed MSContin for more consistent pain relief. Scheduled Tizanidine TID. Resumed muscle rub to right  thigh as it was helping before.              Changed IV dilaudid to prior to VAC changes.    Monitor with increased exertion 4. Mood: Team to provide ego support. LCSW to follow for evaluation and support.              -antipsychotic agents: N/A 5. Neuropsych: This patient is capable of making decisions on his own behalf. 6. Skin/Wound Care:  Monitor wound daily. Added vitamins and protein supplement to help promote wound healing.  7. Fluids/Electrolytes/Nutrition: Monitor I/Os 8. Klebsiella bacteremia w/diskitis: On Cefazolin 2 g every 8 hrs with end date 03/11/21 -- Check weekly CRP/ESR.  9. HTN: Monitor BP tid--continue Microzide and cozaar daily.  Monitor with increased mobility 10. T2DM with hyperglycemia: CM diet. On Metformin 1000 mg bid  Monitor with increased activity 11. Neurogenic bladder: On flomax. Increased to 0.8 mg nightly  13. Neuropathy: On Neurontin 600 mg TID.  14. ABLA: monitor for signs of bleeding. Hgb stabilizing to 8 range.  Hb 8.9 on 10/21 Cont to monitor 15. 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 -- Follow up with surgery after discharge.  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  17. Abdominal pain: Much better--dysesthesias?  18. Hypomagnesemia: Resumed Mag Ox.   Mag 1.6 on 10/21, labs ordered for Monday 19. Hyponatremia  Na+ 132 on 10/21, labs ordered for Monday 20. Hypoalbuminemia  Supplement initiated on 10/21  LOS: 1 days A FACE TO FACE EVALUATION WAS PERFORMED  Ader Fritze Lorie Phenix 02/01/2021, 11:14 AM

## 2021-02-01 NOTE — Consult Note (Signed)
[place] Nurse wound follow up Patient receiving care in [facility] 4W16 Upon entering the room, the vac machine was found to be unplugged and the battery was dead. Unknown duration of time that the vac was not working.  Wound type: Surgical lumbar wound Wound bed: red granulation tissue with yellow fibrinous tissue throughout the wound.  Drainage (amount, consistency, odor) Serosanguinous  in the canister. Periwound: minor maceration around the edges of the wound.  Dressing procedure/placement/frequency: 4 pieces of black foam removed. Used 28M skin barrier wipes on the surrounding skin. 1 large piece of black foam replaced. Bridged over to the right of the wound using 2 pieces of black foam. 3 pieces of black foam used all together. Drape applied, immediate suction obtained at 125 mmHg. Supplies ordered to be placed at bedside.    WOC will follow M W F for vac dressing changes.   Cathlean Marseilles Tamala Julian, MSN, RN, [place], Lysle Pearl, [facility] Wound Treatment Associate Pager [phone]

## 2021-02-01 NOTE — Progress Notes (Signed)
Inpatient Rehabilitation  Patient information reviewed and entered into eRehab system by Delorus Langwell Shyla Gayheart, OTR/L.   Information including medical coding, functional ability and quality indicators will be reviewed and updated through discharge.    

## 2021-02-01 NOTE — Progress Notes (Signed)
Physical Therapy Note  Patient Details  Name: [name] MRN: [id number] Date of Birth: [birth year] Today's Date: 02/01/2021    Attempted to see pt at 74 this PM, but pt reported he was in too much pain to participate at this time. Nsg already aware and pt medicated after previous OT session. Pt politely declined any assistance or repositioning, and was left with all needs in reach and alarm active.   Mickel Fuchs 02/01/2021, 4:31 PM

## 2021-02-01 NOTE — Evaluation (Signed)
Physical Therapy Assessment and Plan  Patient Details  Name: [name] MRN: [id number] Date of Birth: January 08, 1966  PT Diagnosis: Coordination disorder, Difficulty walking, Low back pain, Muscle spasms, and Pain in back,  Rehab Potential: Good ELOS: 2-3 weeks   Today's Date: 02/01/2021 PT Individual Time: 1130-1155 PT Individual Time Calculation (min): 25 min    Hospital Problem: Principal Problem:   Lumbar disc herniation with myelopathy Active Problems:   Hypoalbuminemia due to protein-calorie malnutrition (HCC)   Hyponatremia   Hypomagnesemia   Acute blood loss anemia   Postoperative pain   Past Medical History:  Past Medical History:  Diagnosis Date   DDD (degenerative disc disease), lumbar    Diabetes mellitus    Hyperlipemia    Hypertension    Obesity    OSA (obstructive sleep apnea)    on CPAP   Past Surgical History:  Past Surgical History:  Procedure Laterality Date   APPLICATION OF ROBOTIC ASSISTANCE FOR SPINAL PROCEDURE N/A 01/11/2021   Procedure: APPLICATION OF ROBOTIC ASSISTANCE FOR SPINAL PROCEDURE;  Surgeon: Judith Part, MD;  Location: [place];  Service: Neurosurgery;  Laterality: N/A;   APPLICATION OF WOUND VAC N/A 01/25/2021   Procedure: APPLICATION OF WOUND VAC;  Surgeon: Dawley, Theodoro Doing, DO;  Location: [place];  Service: Neurosurgery;  Laterality: N/A;   BACK SURGERY     COLONOSCOPY  2019   LUMBAR WOUND DEBRIDEMENT N/A 01/25/2021   Procedure: Irrigation and Debridement of lumbar wound, and wound vacuum assisted closure;  Surgeon: Dawley, Theodoro Doing, DO;  Location: [place];  Service: Neurosurgery;  Laterality: N/A;   TRANSFORAMINAL LUMBAR INTERBODY FUSION (TLIF) WITH PEDICLE SCREW FIXATION 4 LEVEL N/A 01/11/2021   Procedure: Lumbar one-two, Lumbar two-three, Lumbar three-four, Lumbar four-five, Lumbar five Sacral one Open decompression, Transforaminal lumbar interbody fusion, posterolateral instrumented fusion;  Surgeon: Judith Part, MD;   Location: [place];  Service: Neurosurgery;  Laterality: N/A;    Assessment & Plan Clinical Impression: [name] is a 55 year old male with history of T2DM, OSA, obesity. DDD with cervical and lumbar stenosis with RLE radiculopathy and significant pain limiting functional status since May 2022. He was found to have lumbar spondylosis with severe canal stenosis from L1-L2  to L4-L5.  He was admitted on 01/12/2019 for L1-S1 laminectomy with PLIF by Dr. Venetia Constable.  Hospital course was significant for issues with hypotension as well as lethargy, elevated CK with lactic acidosis, intermittent tachycardia as well as acute blood loss anemia with hyponatremia as well as drainage from back incision.  He was treated with IV fluids and hyponatremia felt to be dilutional due to fluid boluses.  His blood pressures were improving however he continued to be limited by orthostatic symptoms as well as pain with activity and poor p.o. intake.  Foley was placed due to urinary retention.he continued to be limited by pain, neuropathy, spasms BLE as well as weakness affecting mobility and ADLs. CIR was recommended due to functional decline.     He was admitted to inpatient rehab on 01/18/2021 for comprehensive intensive rehab.  He continued to have issues with abdominal and back pain with poor p.o. intake as well as ongoing serosanguineous drainage from superior aspect of wound.  He was started on doxycycline for wound prophylaxis and multiple supplements were added to help promote wound healing.  He had recurrent drop in sodium to 128 with AKI and was treated with IV fluids.  He developed hypotension with tachycardia and low-grade fevers due  to SIRS on 10/10.  He was started on broad-spectrum antibiotics and pancultured with blood culture showing Kleb pneumonia.  Antibiotics narrowed to ceftriaxone per ID input.  2D echo was done and was negative for endocarditis.  He did have enlarged spleen felt to be due to prior EBV  infection.     Lumbar spine CT was done due to ongoing back drainage and showed fluid collection soft tissues and musculature posterior to L1/S1 felt to be postop seroma as well as soft tissue fluid collection extending from T10-S1.  Neurosurgery was contacted for input patient was discharged to acute hospital for wound washout and cultures. No purulence noted and wound culture was positive for Klebsiella. Dr. West Bali recommends 6 weeks IV cefazolin from OR date of 10/14 and to follow up on outpatient basis. Back pain is improving but patient continues to be limited by weakness with poor posture and fatigue affecting functional status. Therapy ongoing and he was cleared to resume CIR. Patient transferred to CIR on 01/31/2021 .   Patient currently requires min with mobility secondary to muscle weakness, impaired timing and sequencing and decreased coordination, and decreased balance strategies and difficulty maintaining precautions.  Prior to hospitalization, patient was independent  with mobility and lived with Spouse, Daughter in a House home.  Home access is 3Stairs to enter.  Patient will benefit from skilled PT intervention to maximize safe functional mobility, minimize fall risk, and decrease caregiver burden for planned discharge home with 24 hour assist.  Anticipate patient will benefit from follow up [facility] at discharge.  PT - End of Session Activity Tolerance: Tolerates < 10 min activity, no significant change in vital signs Endurance Deficit: Yes Endurance Deficit Description: Primarily limited by pain, requires to be seated to complete ADL PT Assessment Rehab Potential (ACUTE/IP ONLY): Good PT Barriers to Discharge: [place] home environment;Decreased caregiver support;Home environment access/layout;Wound Care;Lack of/limited family support;Insurance for SNF coverage;Weight;Medication compliance PT Barriers to Discharge Comments: Pt is limited by pain PT Patient demonstrates impairments  in the following area(s): Balance;Safety;Sensory;Endurance;Skin Integrity;Pain PT Transfers Functional Problem(s): Bed Mobility;Bed to Chair;Furniture PT Locomotion Functional Problem(s): Ambulation;Stairs;Wheelchair Mobility PT Plan PT Intensity: Minimum of 1-2 x/day ,45 to 90 minutes PT Frequency: 5 out of 7 days PT Duration Estimated Length of Stay: 2-3 weeks PT Treatment/Interventions: Ambulation/gait training;Community reintegration;DME/adaptive equipment instruction;Neuromuscular re-education;Psychosocial support;Stair training;UE/LE Strength taining/ROM;Wheelchair propulsion/positioning;UE/LE Coordination activities;Therapeutic Exercise;Patient/family education;Functional mobility training;Disease management/prevention;Cognitive remediation/compensation;Balance/vestibular training;Discharge planning;Functional electrical stimulation;Pain management;Skin care/wound management;Therapeutic Activities PT Transfers Anticipated Outcome(s): supervision PT Locomotion Anticipated Outcome(s): supervision short distance gait PT Recommendation Recommendations for Other Services: Therapeutic Recreation consult Therapeutic Recreation Interventions: Stress management;Outing/community reintergration;Kitchen group;Pet therapy Follow Up Recommendations: Outpatient PT;Home health PT Patient destination: Home Equipment Recommended: To be determined   PT Evaluation Precautions/Restrictions Precautions Precautions: Fall;Back;Other (comment) Precaution Comments: wound vac, pt able to recall back precautions Other Brace: no brace needed per order, d/t wound vac General PT Amount of Missed Time (min): 35 Minutes PT Missed Treatment Reason: Nursing care Vital SignsTherapy Vitals Temp: 98 F (36.7 C) Temp Source: Oral Pulse Rate: (!) 103 Resp: 17 BP: 106/68 Patient Position (if appropriate): Lying Oxygen Therapy SpO2: 91 % O2 Device: Room Air Pain Pain Assessment Pain Score: 7  Pain  Interference Pain Interference Pain Effect on Sleep: 3. Frequently Pain Interference with Therapy Activities: 4. Almost constantly Pain Interference with Day-to-Day Activities: 3. Frequently Home Living/Prior Functioning Home Living Available Help at Discharge: Family;Available 24 hours/day (wife and brother) Type of Home: House Home Access: Stairs to enter [employer]  of Steps: 3 Entrance Stairs-Rails: Left;Right Home Layout: One level  Lives With: Spouse;Daughter Prior Function Level of Independence: Independent with basic ADLs;Independent with gait;Independent with transfers  Able to Take Stairs?: Yes Driving: Yes Vocation: On disability Vocation Requirements: worked Ecologist before pain onset Comments: Prior to initial surgery, patient was independent but not working due to pain Vision/Perception  Perception Perception: Within Advertising copywriter Praxis Praxis: Intact  Cognition Overall Cognitive Status: Within Functional Limits for tasks assessed Arousal/Alertness: Awake/alert Year: 2022 Month: October Day of Week: Correct Attention: Focused;Sustained Focused Attention: Appears intact Sustained Attention: Appears intact Memory: Appears intact Awareness: Appears intact Problem Solving: Appears intact Safety/Judgment: Appears intact Sensation Sensation Light Touch: Appears Intact Hot/Cold: Appears Intact Proprioception: Appears Intact Stereognosis: Appears Intact Coordination Gross Motor Movements are Fluid and Coordinated: No Coordination and Movement Description: Overall slowed due to RLE/back pain Motor  Motor Motor: Within Functional Limits;Other (comment) Motor - Skilled Clinical Observations: RLE weakness only present with severe increases in pain otherwise [name]   Trunk/Postural Assessment  Cervical Assessment Cervical Assessment: Exceptions to [facility] (forward head) Thoracic Assessment Thoracic Assessment:  Exceptions to [facility] (rounded shoulders) Lumbar Assessment Lumbar Assessment: Exceptions to [facility] (posteriorly rotated pelvis with reduced lordosis) Postural Control Postural Control: Within Functional Limits  Balance Static Sitting Balance Static Sitting - Level of Assistance: 5: Stand by assistance Dynamic Sitting Balance Dynamic Sitting - Level of Assistance: 5: Stand by assistance Static Standing Balance Static Standing - Level of Assistance: 4: Min assist Extremity Assessment      RLE Assessment RLE Assessment: Exceptions to [facility] General Strength Comments: Ankle strength WNL, hip and knee grossly 3+/5, extremely limited by pain LLE Assessment LLE Assessment: Exceptions to [facility] General Strength Comments: Ankle strength WNL, hip and knee grossly 3+/5, extremely limited by pain  Care Tool Care Tool Bed Mobility Roll left and right activity   Roll left and right assist level: Contact Guard/Touching assist    Sit to lying activity   Sit to lying assist level: Supervision/Verbal cueing    Lying to sitting on side of bed activity   Lying to sitting on side of bed assist level: the ability to move from lying on the back to sitting on the side of the bed with no back support.: Supervision/Verbal cueing     Care Tool Transfers Sit to stand transfer Sit to stand activity did not occur: Safety/medical concerns      Chair/bed transfer Chair/bed transfer activity did not occur: Safety/medical concerns       Psychologist, counselling transfer activity did not occur: Safety/medical concerns        Care Tool Locomotion Ambulation Ambulation activity did not occur: Safety/medical concerns        Walk 10 feet activity Walk 10 feet activity did not occur: Safety/medical concerns Assist level: Minimal Assistance - Patient > 75%     Walk 50 feet with 2 turns activity Walk 50 feet with 2 turns activity did not occur: Safety/medical concerns      Walk 150 feet activity  Walk 150 feet activity did not occur: Safety/medical concerns      Walk 10 feet on uneven surfaces activity Walk 10 feet on uneven surfaces activity did not occur: Safety/medical concerns      Stairs Stair activity did not occur: Safety/medical concerns        Walk up/down 1 step activity Walk up/down 1 step or curb (drop down)  activity did not occur: Safety/medical concerns     Walk up/down 4 steps activity did not occuR: Safety/medical concerns  Walk up/down 4 steps activity      Walk up/down 12 steps activity Walk up/down 12 steps activity did not occur: Safety/medical concerns      Pick up small objects from floor Pick up small object from the floor (from standing position) activity did not occur: Safety/medical concerns      Wheelchair Is the patient using a wheelchair?: Yes   Wheelchair activity did not occur: Safety/medical concerns      Wheel 50 feet with 2 turns activity Wheelchair 50 feet with 2 turns activity did not occur: Safety/medical concerns    Wheel 150 feet activity Wheelchair 150 feet activity did not occur: Safety/medical concerns      Refer to Care Plan for Long Term Goals  SHORT TERM GOAL WEEK 1 PT Short Term Goal 1 (Week 1): Pt will tolerate OOB activity x 30 min PT Short Term Goal 2 (Week 1): Pt will perform standing transfers with consistent CGA. PT Short Term Goal 3 (Week 1): Pt will ambulate at least 30 feet consistently with CGA and LRAD.  Recommendations for other services: Therapeutic Recreation  Pet therapy, Kitchen group, Stress management, and Outing/community reintegration  Skilled Therapeutic Intervention Mobility Bed Mobility Rolling Left: Supervision/Verbal cueing Left Sidelying to Sit: Supervision/Verbal cueing Sit to Supine: Supervision/Verbal cueing Transfers Transfers: Sit to Stand;Stand to Sit;Stand Pivot Transfers Sit to Stand: Minimal Assistance - Patient > 75% Stand to Sit: Minimal Assistance - Patient > 75% Stand Pivot  Transfers: Minimal Assistance - Patient > 75% Stand Pivot Transfer Details: Tactile cues for initiation;Verbal cues for technique;Verbal cues for precautions/safety;Verbal cues for sequencing;Verbal cues for safe use of DME/AE Transfer (Assistive device): Rolling walker Locomotion  Gait Ambulation: No Gait Gait: No Stairs / Additional Locomotion Stairs: No Wheelchair Mobility Wheelchair Mobility: No  Evaluation completed (see details above and below) with education on PT POC and goals and individual treatment initiated with focus on  assessing current level of mobility. Nsg present on arrival with need to perform I/O cath, pt missed 35 min of skilled time for nsg care. During this time, therapist retrieved equipment. Upon return, pt reported 10/10 pain, nsg aware and pt premedicated. Subjective and sensation testing performed in supine. Supine>sit with supervision, pt able to direct therapist how to manipulate bed features. Strength testing attempted in sitting, but caused spike in pain. Pt returned to supine with mod A for LE management. Strength testing performed in supine as noted above. No further mobility could be performed d/t time constraints and pain during later session. Pt remained in bed and was left with all needs in reach and alarm active.    Discharge Criteria: Patient will be discharged from PT if patient refuses treatment 3 consecutive times without medical reason, if treatment goals not met, if there is a change in medical status, if patient makes no progress towards goals or if patient is discharged from hospital.  The above assessment, treatment plan, treatment alternatives and goals were discussed and mutually agreed upon: by patient  Mickel Fuchs 02/01/2021, 4:24 PM

## 2021-02-02 LAB — GLUCOSE, CAPILLARY
Glucose-Capillary: 173 mg/dL — ABNORMAL HIGH (ref 70–99)
Glucose-Capillary: 154 mg/dL — ABNORMAL HIGH (ref 70–99)
Glucose-Capillary: 160 mg/dL — ABNORMAL HIGH (ref 70–99)
Glucose-Capillary: 183 mg/dL — ABNORMAL HIGH (ref 70–99)

## 2021-02-02 NOTE — Progress Notes (Signed)
Pt set up on cpap for the night. ?

## 2021-02-02 NOTE — Progress Notes (Signed)
[facility] PHYSICAL MEDICINE & REHABILITATION PROGRESS NOTE  Subjective/Complaints: No complaints this morning  ROS: Denies CP, SOB, N/V/D  Objective: Vital Signs: Blood pressure 117/68, pulse (!) 109, temperature 98.9 F (37.2 C), resp. rate 17, height '6\' 2"'  (1.88 m), weight (!) 142.3 kg, SpO2 98 %. No results found. Recent Labs    02/01/21 0503  WBC 8.9  HGB 8.9*  HCT 27.2*  PLT 386   Recent Labs    02/01/21 0503  NA 132*  K 3.5  CL 94*  CO2 27  GLUCOSE 163*  BUN 9  CREATININE 0.82  CALCIUM 8.6*    Intake/Output Summary (Last 24 hours) at 02/02/2021 2014 Last data filed at 02/02/2021 1845 Gross per 24 hour  Intake 682.2 ml  Output 1100 ml  Net -417.8 ml        Physical Exam: BP 117/68 (BP Location: Left Arm)   Pulse (!) 109   Temp 98.9 F (37.2 C)   Resp 17   Ht '6\' 2"'  (1.88 m)   Wt (!) 142.3 kg   SpO2 98%   BMI 40.28 kg/m  Constitutional: No distress . Vital signs reviewed.  Morbidly obese. HENT: Normocephalic.  Atraumatic. Eyes: EOMI. No discharge. Cardiovascular: No JVD.  + Tachycardia. Respiratory: Normal effort.  No stridor.  Bilateral clear to auscultation. GI: Non-distended.  BS +. Skin: Warm and dry.  + Wound VAC to back Psych: Normal mood.  Normal behavior. Musc: No edema in extremities.  No tenderness in extremities. Neuro: Alert Motor: Bilateral upper extremities: 5/5 proximal distal LE- HF 4/5; KE/KF 4+/5; Df/PF 4+/5  Sensation intact  Assessment/Plan: 1. Functional deficits which require 3+ hours per day of interdisciplinary therapy in a comprehensive inpatient rehab setting. Physiatrist is providing close team supervision and 24 hour management of active medical problems listed below. Physiatrist and rehab team continue to assess barriers to discharge/monitor patient progress toward functional and medical goals   Care Tool:  Bathing    Body parts bathed by patient: Right arm, Left arm, Chest, Abdomen, Front perineal area,  Right upper leg, Left upper leg, Buttocks, Face   Body parts bathed by helper: Right lower leg, Left lower leg     Bathing assist Assist Level: Minimal Assistance - Patient > 75%     Upper Body Dressing/Undressing Upper body dressing   What is the patient wearing?: Hospital gown only    Upper body assist Assist Level: Minimal Assistance - Patient > 75%    Lower Body Dressing/Undressing Lower body dressing      What is the patient wearing?: Pants     Lower body assist Assist for lower body dressing: Minimal Assistance - Patient > 75%     Toileting Toileting    Toileting assist Assist for toileting: 2 Helpers     Transfers Chair/bed transfer  Transfers assist  Chair/bed transfer activity did not occur: Safety/medical concerns  Chair/bed transfer assist level: Minimal Assistance - Patient > 75%     Locomotion Ambulation   Ambulation assist   Ambulation activity did not occur: Safety/medical concerns          Walk 10 feet activity   Assist  Walk 10 feet activity did not occur: Safety/medical concerns  Assist level: Minimal Assistance - Patient > 75%     Walk 50 feet activity   Assist Walk 50 feet with 2 turns activity did not occur: Safety/medical concerns         Walk 150 feet activity   Assist Walk  150 feet activity did not occur: Safety/medical concerns         Walk 10 feet on uneven surface  activity   Assist Walk 10 feet on uneven surfaces activity did not occur: Safety/medical concerns         Wheelchair     Assist Is the patient using a wheelchair?: Yes   Wheelchair activity did not occur: Safety/medical concerns         Wheelchair 50 feet with 2 turns activity    Assist    Wheelchair 50 feet with 2 turns activity did not occur: Safety/medical concerns       Wheelchair 150 feet activity     Assist  Wheelchair 150 feet activity did not occur: Safety/medical concerns        Medical Problem  List and Plan: 1.  Lumbar radiculopathy/myelopathy secondary to nerve root compression- s/p decompression and wash out after bacteremia from klebsiella pneumonia Continue CIR  2.  Impaired mobility: continue Lovenox             -antiplatelet therapy: N/A 3. Postoperative pain: Continue Oxycodone 15 mg prn. Resumed MSContin for more consistent pain relief. Scheduled Tizanidine TID. Resumed muscle rub to right thigh as it was helping before.              Changed IV dilaudid to prior to VAC changes.    Monitor with increased exertion 4. Mood: Team to provide ego support. LCSW to follow for evaluation and support.              -antipsychotic agents: N/A 5. Neuropsych: This patient is capable of making decisions on his own behalf. 6. Skin/Wound Care:  Monitor wound daily. Added vitamins and protein supplement to help promote wound healing.  7. Fluids/Electrolytes/Nutrition: Monitor I/Os 8. Klebsiella bacteremia w/diskitis: On Cefazolin 2 g every 8 hrs with end date 03/11/21 -- Check weekly CRP/ESR.  9. HTN: Monitor BP tid--continue Microzide and cozaar daily.  Monitor with increased mobility 10. T2DM with hyperglycemia: CM diet. On Metformin 1000 mg bid  Monitor with increased activity 11. Neurogenic bladder: On flomax. Increased to 0.8 mg nightly  13. Neuropathy: Continue Neurontin 600 mg TID.  14. ABLA: monitor for signs of bleeding. Hgb stabilizing to 8 range.  Hb 8.9 on 10/21 Cont to monitor 15. 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 -- Follow up with surgery after discharge.  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  17. Abdominal pain: Much better--dysesthesias?  18. Hypomagnesemia: Resumed Mag Ox.   Mag 1.6 on 10/21, labs ordered for Monday 19. Hyponatremia  Na+ 132 on 10/21, labs ordered for Monday 20. Hypoalbuminemia  Supplement initiated on 10/21  LOS: 2 days A FACE TO FACE EVALUATION WAS PERFORMED  Clide Deutscher Kelechi Astarita 02/02/2021, 8:14  PM

## 2021-02-03 LAB — GLUCOSE, CAPILLARY
Glucose-Capillary: 163 mg/dL — ABNORMAL HIGH (ref 70–99)
Glucose-Capillary: 197 mg/dL — ABNORMAL HIGH (ref 70–99)
Glucose-Capillary: 174 mg/dL — ABNORMAL HIGH (ref 70–99)
Glucose-Capillary: 188 mg/dL — ABNORMAL HIGH (ref 70–99)

## 2021-02-03 MED ORDER — ENSURE MAX PROTEIN PO LIQD: 11.0000 [oz_av] | Freq: Every day | ORAL | Status: DC | PRN

## 2021-02-03 MED ORDER — CYCLOBENZAPRINE HCL 5 MG PO TABS
7.5000 mg | ORAL_TABLET | Freq: Three times a day (TID) | ORAL | Status: DC | PRN
  Administered 2021-02-03 – 2021-02-05 (×3): 7.5 mg via ORAL
  Filled 2021-02-03 (×3): qty 2

## 2021-02-03 NOTE — Progress Notes (Signed)
Occupational Therapy Session Note  Patient Details  Name: [name] MRN: [id number] Date of Birth: Aug 25, 1965  Today's Date: 02/03/2021 OT Individual Time: [phone] and [id number] OT Individual Time Calculation (min): 45 min  and 10 min and Today's Date: 02/03/2021 OT Missed Time: 15 Minutes (AM session) and 35 minutes (PM session) Missed Time Reason: Pain   Short Term Goals: Week 1:  OT Short Term Goal 1 (Week 1): Pt will don B shoes wiht MIN cuing for AE technique OT Short Term Goal 2 (Week 1): Pt will complete ambulatory toilet transfer with CGA consistently + LRAD. OT Short Term Goal 3 (Week 1): Pt will bathe wiht S   Skilled Therapeutic Interventions/Progress Updates:    Session 1: Pt greeted at time of session supine in bed resting with continuous back pain throughout session, reaching max pain of 8/10 after activity, improved with rest and positional change. Supine > sit with HOB elevated with log roll Supervision/CGA. Sitting EOB, RN present to detach IV for easier mobility. Wound vac on front of RW and therapist assist managing throughout session. Sit > stand and walked short distance to sink with Min A overall with bariwalker. Seated at w/c, pt performing bathing tasks wanting to only wash UB today with Supervision. Politely declined buttocks, periarea and legs. Focus on sitting balance and facilitating hip flexion sitting in wheelchair to reach objects at sink level within back precautions. Donned new gown, scrub pants/shorts with Mod A to thread and pt able to assist donning over hips in standing. Stand pivot w/c> bed Min A. Began education on energy conservation with donning pants at same time transferring to minimze pain with up/down movements with ADL. Sit > supine Mod for LE management. Alarm on call bell in reach.   Session 2: Pt greeted at time of session semireclined in bed with IV running, stating he has 10/10 pain in back. Noted to be in poor position bed level. Pt  politely declining OT session 2/2 pain. Pt rolling L/R with CGA for therapist to place pillows under shoulder, back, and hips as well as between knees. Pt in side lying for pressure relief. RN aware of pain, provided muscle relaxer. Alarm on call bell in reach.   Therapy Documentation Precautions:  Precautions Precautions: Fall, Back, Other (comment) Precaution Booklet Issued: No Precaution Comments: wound vac, pt able to recall back precautions Required Braces or Orthoses: Other Brace Other Brace: no brace needed per order, d/t wound vac Restrictions Weight Bearing Restrictions: No    Therapy/Group: Individual Therapy  Viona Gilmore 02/03/2021, 7:10 AM

## 2021-02-03 NOTE — Progress Notes (Signed)
Physical Therapy Session Note  Patient Details  Name: MARLAN STEWARD MRN: [id number] Date of Birth: 1965-04-17  Today's Date: 02/03/2021 PT Individual Time: 0950-1030 PT Individual Time Calculation (min): 40 min   Short Term Goals: Week 1:  PT Short Term Goal 1 (Week 1): Pt will tolerate OOB activity x 30 min PT Short Term Goal 2 (Week 1): Pt will perform standing transfers with consistent CGA. PT Short Term Goal 3 (Week 1): Pt will ambulate at least 30 feet consistently with CGA and LRAD.  Skilled Therapeutic Interventions/Progress Updates:     Patient in bed in L side-lying upon PT arrival. Patient reported 10/10 R side low back and R quad pain with spasm during session, RN made aware. PT provided repositioning, rest breaks, and distraction as pain interventions throughout session. Discussed pain management with patient and RN, RN alerted MD to poor pain control for patient impacting patient tolerance for therapies.   Patient reports he did get up with OT this morning following pain meds, however, at this time is unable to perform mobility due to elevated pain levels. Focused session on repositioning, diaphragmatic breathing, heat therapy, and soft tissue and trigger point release for pain management. Turned on patient selected "calming" music (relaxing instrumental) and maintained lights off for calming environment. Provided multimodal cues for use of diaphragmatic breathing with hot packs applied to R lumbar and R quad >5 min. Performed soft tissue mobilization to R rectus femorous, vastus lateralus, TFL, and lateral hamstrings, also to R erector spinae and quadratus lumborum >25 min. Patient continues to report 10/10 pain after interventions, however, endorses "10!!!" reduced to "10!" and reports increased relaxation. Performed lateral scoot in bed for improved L side-lying with pillows under his back and pillow between his knees for improved positioning, pressure relief, and muscle relaxation.  Patient declined further intervention due to elevated pain levels and improved relaxation and positioning. Patient missed 35 min of skilled PT due to pain, RN made aware. Will attempt to make-up missed time as able.    Patient in bed, as above, at end of session with breaks locked, bed alarm set, and all needs within reach.   Therapy Documentation Precautions:  Precautions Precautions: Fall, Back, Other (comment) Precaution Booklet Issued: No Precaution Comments: wound vac, pt able to recall back precautions Required Braces or Orthoses: Other Brace Other Brace: no brace needed per order, d/t wound vac Restrictions Weight Bearing Restrictions: No General: PT Amount of Missed Time (min): 35 Minutes PT Missed Treatment Reason: Pain     Therapy/Group: Individual Therapy  Loletta Harper L Kashira Behunin PT, DPT  02/03/2021, 10:35 AM

## 2021-02-04 LAB — BASIC METABOLIC PANEL
Anion gap: 9 (ref 5–15)
BUN: 29 mg/dL — ABNORMAL HIGH (ref 6–20)
CO2: 27 mmol/L (ref 22–32)
Calcium: 8.5 mg/dL — ABNORMAL LOW (ref 8.9–10.3)
Chloride: 92 mmol/L — ABNORMAL LOW (ref 98–111)
Creatinine, Ser: 1.32 mg/dL — ABNORMAL HIGH (ref 0.61–1.24)
GFR, Estimated: >60 mL/min (ref >60)
Glucose, Bld: 178 mg/dL — ABNORMAL HIGH (ref 70–99)
Potassium: 3.7 mmol/L (ref 3.5–5.1)
Sodium: 128 mmol/L — ABNORMAL LOW (ref 135–145)

## 2021-02-04 LAB — CBC
HCT: 25.2 % — ABNORMAL LOW (ref 39.0–52.0)
Hemoglobin: 8.2 g/dL — ABNORMAL LOW (ref 13.0–17.0)
MCH: 28.9 pg (ref 26.0–34.0)
MCHC: 32.5 g/dL (ref 30.0–36.0)
MCV: 88.7 fL (ref 80.0–100.0)
Platelets: 326 K/uL (ref 150–400)
RBC: 2.84 MIL/uL — ABNORMAL LOW (ref 4.22–5.81)
RDW: 14.7 % (ref 11.5–15.5)
WBC: 8.9 K/uL (ref 4.0–10.5)
nRBC: 0 % (ref 0.0–0.2)

## 2021-02-04 LAB — URINALYSIS, ROUTINE W REFLEX MICROSCOPIC
Bilirubin Urine: NEGATIVE
Glucose, UA: NEGATIVE mg/dL
Hgb urine dipstick: NEGATIVE
Ketones, ur: NEGATIVE mg/dL
Leukocytes,Ua: NEGATIVE
Nitrite: NEGATIVE
Protein, ur: NEGATIVE mg/dL
Specific Gravity, Urine: 1.011 (ref 1.005–1.030)
pH: 5 (ref 5.0–8.0)

## 2021-02-04 LAB — GLUCOSE, CAPILLARY
Glucose-Capillary: 159 mg/dL — ABNORMAL HIGH (ref 70–99)
Glucose-Capillary: 200 mg/dL — ABNORMAL HIGH (ref 70–99)
Glucose-Capillary: 147 mg/dL — ABNORMAL HIGH (ref 70–99)
Glucose-Capillary: 141 mg/dL — ABNORMAL HIGH (ref 70–99)

## 2021-02-04 LAB — SEDIMENTATION RATE: Sed Rate: 85 mm/h — ABNORMAL HIGH (ref 0–16)

## 2021-02-04 LAB — MAGNESIUM: Magnesium: 2.1 mg/dL (ref 1.7–2.4)

## 2021-02-04 LAB — C-REACTIVE PROTEIN: CRP: 8.7 mg/dL — ABNORMAL HIGH

## 2021-02-04 MED ORDER — SENNA 8.6 MG PO TABS
2.0000 | ORAL_TABLET | Freq: Two times a day (BID) | ORAL | Status: DC
  Administered 2021-02-04 – 2021-02-14 (×20): 17.2 mg via ORAL
  Filled 2021-02-04 (×20): qty 2

## 2021-02-04 MED ORDER — SORBITOL 70 % SOLN
60.0000 mL | Freq: Once | Status: AC
  Administered 2021-02-04: 60 mL via ORAL
  Filled 2021-02-04: qty 60

## 2021-02-04 MED ORDER — SODIUM CHLORIDE 0.9 % IV SOLN: INTRAVENOUS | Status: DC | PRN

## 2021-02-04 NOTE — Consult Note (Signed)
[place] Nurse wound follow up Patient receiving care in [facility] 4W16 Wound type: Surgical lumbar wound Wound bed: red granulation tissue with minimal yellow fibrinous tissue throughout the wound.  Drainage (amount, consistency, odor) Serosanguinous  in the canister. Periwound: minor maceration around the edges of the wound.  Dressing procedure/placement/frequency: 3 pieces of black foam removed. Used 48M skin barrier wipes on the surrounding skin. 1 large piece of black foam replaced. Bridged over to the right of the wound using 2 pieces of black foam. 3 pieces of black foam used all together. Drape applied, immediate suction obtained at 125 mmHg. Patient sits up on the side of the bed for the dressing change.  Supplies at bedside.    WOC will follow M W F for vac dressing changes.   Cathlean Marseilles Tamala Julian, MSN, RN, [place], Lysle Pearl, [facility] Wound Treatment Associate Pager [phone]

## 2021-02-04 NOTE — Progress Notes (Signed)
Note patient with drop in Na levels--will d/c HCTZ but already received it today. Also add FR of 1500 cc/day. Will recheck labs in am.

## 2021-02-04 NOTE — Progress Notes (Signed)
U/A w/ C&S collected and sent to lab

## 2021-02-04 NOTE — Progress Notes (Signed)
[place] PHYSICAL MEDICINE & REHABILITATION PROGRESS NOTE  Subjective/Complaints:  Feels like needs to pee, but cannot- asking Flomax to be increased- explained he's on the max dose.  Will check U/A and Cx to make sure doesn't have associated UTI- ordered.  LBM 3 days ago- no nausea.  Still has pain in R low back.  Na down to 128- stopped HCTZ-put on fluid restriction of 1500cc.    ROS:  Pt denies SOB, abd pain, CP, N/V/C/D, and vision changes   Objective: Vital Signs: Blood pressure 106/61, pulse (!) 109, temperature 98.2 F (36.8 C), temperature source Oral, resp. rate 20, height '6\' 2"'  (1.88 m), weight (!) 142.3 kg, SpO2 98 %. No results found. Recent Labs    02/04/21 0316  WBC 8.9  HGB 8.2*  HCT 25.2*  PLT 326   Recent Labs    02/04/21 0316  NA 128*  K 3.7  CL 92*  CO2 27  GLUCOSE 178*  BUN 29*  CREATININE 1.32*  CALCIUM 8.5*    Intake/Output Summary (Last 24 hours) at 02/04/2021 1827 Last data filed at 02/04/2021 1432 Gross per 24 hour  Intake 601.81 ml  Output 2777 ml  Net -2175.19 ml        Physical Exam: BP 106/61 (BP Location: Left Arm)   Pulse (!) 109   Temp 98.2 F (36.8 C) (Oral)   Resp 20   Ht '6\' 2"'  (1.88 m)   Wt (!) 142.3 kg   SpO2 98%   BMI 40.28 kg/m    General: awake, alert, appropriate, laying in bed; supine; NAD HENT: conjugate gaze; oropharynx moist CV: tachycardic rate; no JVD Pulmonary: CTA B/L; no W/R/R- good air movement GI: soft, NT, (+)BS- maybe slightly distended; hypoactive Psychiatric: appropriate- flat Neurological: alert Skin: Warm and dry.  + Wound VAC to back- no change Psych: Normal mood.  Normal behavior. Musc: No edema in extremities.  No tenderness in extremities. Neuro: Alert Motor: Bilateral upper extremities: 5/5 proximal distal LE- HF 4/5; KE/KF 4+/5; Df/PF 4+/5  Sensation intact  Assessment/Plan: 1. Functional deficits which require 3+ hours per day of interdisciplinary therapy in a  comprehensive inpatient rehab setting. Physiatrist is providing close team supervision and 24 hour management of active medical problems listed below. Physiatrist and rehab team continue to assess barriers to discharge/monitor patient progress toward functional and medical goals   Care Tool:  Bathing    Body parts bathed by patient: Right arm, Left arm, Chest, Abdomen, Left upper leg, Right upper leg, Face   Body parts bathed by helper: Buttocks, Right lower leg, Left lower leg     Bathing assist Assist Level: Moderate Assistance - Patient 50 - 74%     Upper Body Dressing/Undressing Upper body dressing   What is the patient wearing?: Hospital gown only    Upper body assist Assist Level: Minimal Assistance - Patient > 75%    Lower Body Dressing/Undressing Lower body dressing      What is the patient wearing?: Pants     Lower body assist Assist for lower body dressing: Moderate Assistance - Patient 50 - 74%     Toileting Toileting    Toileting assist Assist for toileting: 2 Helpers     Transfers Chair/bed transfer  Transfers assist  Chair/bed transfer activity did not occur: Safety/medical concerns  Chair/bed transfer assist level: Contact Guard/Touching assist     Locomotion Ambulation   Ambulation assist   Ambulation activity did not occur: Safety/medical concerns  Walk 10 feet activity   Assist  Walk 10 feet activity did not occur: Safety/medical concerns  Assist level: Minimal Assistance - Patient > 75%     Walk 50 feet activity   Assist Walk 50 feet with 2 turns activity did not occur: Safety/medical concerns         Walk 150 feet activity   Assist Walk 150 feet activity did not occur: Safety/medical concerns         Walk 10 feet on uneven surface  activity   Assist Walk 10 feet on uneven surfaces activity did not occur: Safety/medical concerns         Wheelchair     Assist Is the patient using a  wheelchair?: Yes   Wheelchair activity did not occur: Safety/medical concerns         Wheelchair 50 feet with 2 turns activity    Assist    Wheelchair 50 feet with 2 turns activity did not occur: Safety/medical concerns       Wheelchair 150 feet activity     Assist  Wheelchair 150 feet activity did not occur: Safety/medical concerns        Medical Problem List and Plan: 1.  Lumbar radiculopathy/myelopathy secondary to nerve root compression- s/p decompression and wash out after bacteremia from klebsiella pneumonia Con't PT and OT/CIR_ move to 15/7 due to difficulty with endurance 2.  Impaired mobility: continue Lovenox             -antiplatelet therapy: N/A 3. Postoperative pain: Continue Oxycodone 15 mg prn. Resumed MSContin for more consistent pain relief. Scheduled Tizanidine TID. Resumed muscle rub to right thigh as it was helping before.              Changed IV dilaudid to prior to VAC changes.    10/24- pain adequate control except R low back- con't regimen for now  Monitor with increased exertion 4. Mood: Team to provide ego support. LCSW to follow for evaluation and support.              -antipsychotic agents: N/A 5. Neuropsych: This patient is capable of making decisions on his own behalf. 6. Skin/Wound Care:  Monitor wound daily. Added vitamins and protein supplement to help promote wound healing.  7. Fluids/Electrolytes/Nutrition: Monitor I/Os 8. Klebsiella bacteremia w/diskitis: On Cefazolin 2 g every 8 hrs with end date 03/11/21 -- Check weekly CRP/ESR.   10/24- CRP up slightly and ESR up- think could have UTI- if doesn't will call ID.  9. HTN: Monitor BP tid--continue Microzide and cozaar daily.  10/24- d/c HCTZ due to low Na  Monitor with increased mobility 10. T2DM with hyperglycemia: CM diet. On Metformin 1000 mg bid  10/24- BG's around 150s- but occ bumps up higher- con't regimen  Monitor with increased activity 11. Neurogenic bladder: with  urinary retention On flomax. Increased to 0.8 mg nightly   10/24- still not voiding- will check U/A and Cx 13. Neuropathy: Continue Neurontin 600 mg TID.  14. ABLA: monitor for signs of bleeding. Hgb stabilizing to 8 range.  Hb 8.9 on 10/21 Cont to monitor 15. 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 -- Follow up with surgery after discharge.  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  17. Abdominal pain: Much better--dysesthesias?  18. Hypomagnesemia: Resumed Mag Ox.   Mag 1.6 on 10/21, labs ordered for Monday  10/24- Mg 2.1 19. Hyponatremia  Na+ 132 on 10/21, labs ordered for Monday  10/24- Na down to 128- will do fluid restriction 1500cc- and stop HCTZ 20. Hypoalbuminemia  Supplement initiated on 10/21 21. Azotemia/ARI  10/24- Cr up to 1.32- usually <1- BUN 29- will recheck in AM and if worse, will start more IVFs 22 Constipation  10/24- will give Sorbitol 66 and increase bowel meds- increase Senokot to 2 tabs BID.    I spent a total of 35 minutes on total care- >50% coordination of care- d/w PA, nursing about U/a and Cx and extended period talking with pt- education on urinary retention.   LOS: 4 days A FACE TO FACE EVALUATION WAS PERFORMED  [name] 02/04/2021, 6:27 PM

## 2021-02-04 NOTE — Care Management (Signed)
[place] Individual Statement of Services  Patient Name:  [name]  Date:  02/04/2021  Welcome to the [facility].  Our goal is to provide you with an individualized program based on your diagnosis and situation, designed to meet your specific needs.  With this comprehensive rehabilitation program, you will be expected to participate in at least 3 hours of rehabilitation therapies Monday-Friday, with modified therapy programming on the weekends.  Your rehabilitation program will include the following services:  Physical Therapy (PT), Occupational Therapy (OT), 24 hour per day rehabilitation nursing, Therapeutic Recreaction (TR), Psychology, Neuropsychology, Care Coordinator, Rehabilitation Medicine, [place], and Other  Weekly team conferences will be held on Tuesdays to discuss your progress.  Your Inpatient Rehabilitation Care Coordinator will talk with you frequently to get your input and to update you on team discussions.  Team conferences with you and your family in attendance may also be held.  Expected length of stay: 10-14 days    Overall anticipated outcome: Supervision  Depending on your progress and recovery, your program may change. Your Inpatient Rehabilitation Care Coordinator will coordinate services and will keep you informed of any changes. Your Inpatient Rehabilitation Care Coordinator's name and contact numbers are listed  below.  The following services may also be recommended but are not provided by the [facility] will be made to provide these services after discharge if needed.  Arrangements include referral to agencies that provide these services.  Your insurance has been verified to be:  [employer]  Your primary doctor is:  [name]  Pertinent information will be shared with your doctor and your insurance company.  Inpatient Rehabilitation Care Coordinator:  Cathleen Corti [phone] or ([phone]  Information discussed with and copy given to patient by: [name], 02/04/2021, 12:11 PM

## 2021-02-04 NOTE — Progress Notes (Signed)
Patient ID: [name], male   DOB: October 17, 1965, 55 y.o.   MRN: [id number]  SW spoke with [facility] with regard to referral for IV abx. Discussed challenges with obtaining HH due to insurance, and care needs (wound vac and IV abx with labs). SW informed will update if wound vac is removed. Both will work on finding a [facility] willing to accept for care needs since pt will also require HHPT/OT.  SW met with pt in room to provide updates from team conference, and d/c date 11/4. SW informed on IV abx end date 11/4 and working with [facility]. Pt aware SW to follow-up with his wife.   SW met with pt wife to provide above updates. Pt wife is wondering if pt will be appropriate for an extension if he needs it. SW informed progress will continued to be discussed at team conference. SW will update after team conference next week.   Loralee Pacas, MSW, [place] Office: [phone] Cell: [phone] Fax: [phone]

## 2021-02-04 NOTE — Progress Notes (Signed)
Physical Therapy Session Note  Patient Details  Name: [name] MRN: [id number] Date of Birth: [birth year]  Today's Date: 02/04/2021 PT Individual Time: 1600-1630 PT Individual Time Calculation (min): 30 min   Short Term Goals: Week 1:  PT Short Term Goal 1 (Week 1): Pt will tolerate OOB activity x 30 min PT Short Term Goal 2 (Week 1): Pt will perform standing transfers with consistent CGA. PT Short Term Goal 3 (Week 1): Pt will ambulate at least 30 feet consistently with CGA and LRAD.  Skilled Therapeutic Interventions/Progress Updates:    Pt received supine in bed, agreeable to participate in PT session as able. Pt reports 8/10 pain at rest in R lower back. Supine to sit with Supervision with use of bedrail. Pt has increase in pain with mobility. Assisted pt with applying muscle rub to painful region in low back. Pt reports pain as a "deep, dull, ache" that is not tender to the touch. Sit to stand with CGA to RW from elevated bed. Stand pivot transfer to w/c with RW and CGA. Sit to stand with min A from lower seat height of w/c to RW. Stand pivot transfer back to bed with RW and CGA. Encouraged pt to perform short distance gait in room, pt declines due to pain. Pt requests to return to bed due to pain and declines further participation in session. Sit to supine mod A for BLE management. Supine to L sidelying with Supervision. Pt left in L sidelying in bed with needs in reach, bed alarm in place. Pt missed 30 min of scheduled therapy session due to pain. Also discussed use of hot pack for pain management however pt not able to combine muscle rub with hot pack, educated on risks. Pt to utilize hot pack later this evening after muscle rub no longer on skin.  Therapy Documentation Precautions:  Precautions Precautions: Fall, Back, Other (comment) Precaution Booklet Issued: No Precaution Comments: wound vac, pt able to recall back precautions Required Braces or Orthoses: Other Brace Other  Brace: no brace needed per order, d/t wound vac Restrictions Weight Bearing Restrictions: No General: PT Amount of Missed Time (min): 30 Minutes PT Missed Treatment Reason: Pain     Therapy/Group: Individual Therapy   Excell Seltzer, PT, DPT, CSRS  02/04/2021, 4:30 PM

## 2021-02-04 NOTE — Progress Notes (Signed)
Occupational Therapy Note  Patient Details  Name: [name] MRN: [id number] Date of Birth: 05/21/1965  Today's Date: 02/04/2021 OT Missed Time: 40 Minutes Missed Time Reason: Pain  Pt resting in bed upon arrival. Pt states his pain is 9/10 in back and unable to participate in therapy. Pt also states he feels like he needs to urinate but is unable. Awaiting nursing for I/O. Pt missed 60 mins skilled OT services.   Leotis Shames [facility] 02/04/2021, 11:30 AM

## 2021-02-04 NOTE — Progress Notes (Signed)
Physical Therapy Note  Patient Details  Name: CAMARON CAMMACK MRN: [id number] Date of Birth: [birth year] Today's Date: 02/04/2021    Pt's plan of care adjusted to 15/7 after speaking with care team and discussed with Algis Liming, PA as pt currently unable to tolerate current therapy schedule with OT and PT due to significant pain limiting ability to functionally participate.    Excell Seltzer, PT, DPT, CSRS  02/04/2021, 11:57 AM

## 2021-02-04 NOTE — Progress Notes (Signed)
Occupational Therapy Session Note  Patient Details  Name: [name] MRN: [id number] Date of Birth: [birth year]  Today's Date: 02/04/2021 OT Individual Time: 2197-5883 OT Individual Time Calculation (min): 55 min    Short Term Goals: Week 1:  OT Short Term Goal 1 (Week 1): Pt will don B shoes wiht MIN cuing for AE technique OT Short Term Goal 2 (Week 1): Pt will complete ambulatory toilet transfer with CGA consistently + LRAD. OT Short Term Goal 3 (Week 1): Pt will bathe wiht S   Skilled Therapeutic Interventions/Progress Updates:    Pt greeted at time of session semireclined in bed, partial side lying, stating back pain is "high" as wound care changed vac this am. Agreeable to OT session despite pain and education on importance of mobility. As pt transitioning to EOB, felt need to urinate and NT located, bladder scanned pt with only 22cc to show. Nursing present shortly after with plans to obtain UA 2/2 feelings of full bladder. Supine > sit Supervision with extended time and bed features, ambulate bed > w/c at sink level CGA with bariatric RW, seated UB/LB bathing with sit > stand at sink for therapist to wash buttocks. Oral hygiene as well seated with set up. Donned new gown with Min A. Stand pivot w/c > bed with CGA/Min and side step toward HOB same manner. Assist managing wound vac and IV pole throughout. Positioned in side lying for off loading back. Alarm on call bell in reach.     Therapy Documentation Precautions:  Precautions Precautions: Fall, Back, Other (comment) Precaution Booklet Issued: No Precaution Comments: wound vac, pt able to recall back precautions Required Braces or Orthoses: Other Brace Other Brace: no brace needed per order, d/t wound vac Restrictions Weight Bearing Restrictions: No     Therapy/Group: Individual Therapy  Viona Gilmore 02/04/2021, 7:21 AM

## 2021-02-04 NOTE — Progress Notes (Signed)
Pt states he does not want to go on CPAP tonight. Currently sat 99% on RA. RT will cont to monitor.

## 2021-02-05 LAB — URINE CULTURE: Culture: NO GROWTH

## 2021-02-05 LAB — BASIC METABOLIC PANEL
Anion gap: 8 (ref 5–15)
BUN: 29 mg/dL — ABNORMAL HIGH (ref 6–20)
CO2: 27 mmol/L (ref 22–32)
Calcium: 8.6 mg/dL — ABNORMAL LOW (ref 8.9–10.3)
Chloride: 92 mmol/L — ABNORMAL LOW (ref 98–111)
Creatinine, Ser: 1.13 mg/dL (ref 0.61–1.24)
GFR, Estimated: >60 mL/min (ref >60)
Glucose, Bld: 138 mg/dL — ABNORMAL HIGH (ref 70–99)
Potassium: 3.6 mmol/L (ref 3.5–5.1)
Sodium: 127 mmol/L — ABNORMAL LOW (ref 135–145)

## 2021-02-05 LAB — GLUCOSE, CAPILLARY
Glucose-Capillary: 321 mg/dL — ABNORMAL HIGH (ref 70–99)
Glucose-Capillary: 198 mg/dL — ABNORMAL HIGH (ref 70–99)
Glucose-Capillary: 146 mg/dL — ABNORMAL HIGH (ref 70–99)
Glucose-Capillary: 168 mg/dL — ABNORMAL HIGH (ref 70–99)

## 2021-02-05 MED ORDER — MORPHINE SULFATE ER 15 MG PO TBCR
30.0000 mg | EXTENDED_RELEASE_TABLET | Freq: Two times a day (BID) | ORAL | Status: DC
  Administered 2021-02-05: 30 mg via ORAL
  Filled 2021-02-05 (×2): qty 2

## 2021-02-05 MED ORDER — SODIUM CHLORIDE 0.9 % IV SOLN: INTRAVENOUS | Status: AC

## 2021-02-05 MED ORDER — CYCLOBENZAPRINE HCL 10 MG PO TABS
10.0000 mg | ORAL_TABLET | Freq: Three times a day (TID) | ORAL | Status: DC | PRN
  Administered 2021-02-05 – 2021-02-27 (×16): 10 mg via ORAL
  Filled 2021-02-05 (×16): qty 1

## 2021-02-05 NOTE — Progress Notes (Signed)
Physical Therapy Session Note  Patient Details  Name: [name] MRN: [id number] Date of Birth: Apr 20, 1965  Today's Date: 02/05/2021 PT Individual Time: 0800-0840 PT Individual Time Calculation (min): 40 min   Short Term Goals: Week 1:  PT Short Term Goal 1 (Week 1): Pt will tolerate OOB activity x 30 min PT Short Term Goal 2 (Week 1): Pt will perform standing transfers with consistent CGA. PT Short Term Goal 3 (Week 1): Pt will ambulate at least 30 feet consistently with CGA and LRAD.  Skilled Therapeutic Interventions/Progress Updates:  Pt received seated in w/c in room, agreeable to PT session. Pt reports 8/10 pain in R lower back. Pt premedicated prior to start of therapy session and has muscle rub on painful region. Sit to stand with min A to RW throughout session. Obtained a 2nd w/c cushion for pt's w/c to increase seat height due to increased pain and difficulty standing up from lower surfaces. Ambulation 2 x 50 ft in pt room with RW and CGA for balance. Pt exhibits flexed trunk posture during gait 2/2 pain. Pt reports pain increases with sit to stand transfer so much that he feels he is going to "black out". Pt requests to return to bed after 2 rounds of ambulation due to pain. Sit to supine mod A for BLE management. Supine to L sidelying with Supervision. Pt left in L sidelying in bed with needs in reach, bed alarm in place.  Therapy Documentation Precautions:  Precautions Precautions: Fall, Back, Other (comment) Precaution Booklet Issued: No Precaution Comments: wound vac, pt able to recall back precautions Required Braces or Orthoses: Other Brace Other Brace: no brace needed per order, d/t wound vac Restrictions Weight Bearing Restrictions: No    Therapy/Group: Individual Therapy   Excell Seltzer, PT, DPT, CSRS  02/05/2021, 8:44 AM

## 2021-02-05 NOTE — Patient Care Conference (Signed)
Inpatient RehabilitationTeam Conference and Plan of Care Update Date: 02/05/2021   Time: 11:11 AM    Patient Name: [name]      Medical Record Number: [id number]  Date of Birth: 01/07/1966 Sex: Male         Room/Bed: 4W16C/4W16C-01 Payor Info: Payor: Theme park manager / Plan: Theme park manager OTHER / Product Type: *No Product type* /    Admit Date/Time:  01/31/2021  3:41 PM  Primary Diagnosis:  Lumbar disc herniation with myelopathy  Hospital Problems: Principal Problem:   Lumbar disc herniation with myelopathy Active Problems:   Hypoalbuminemia due to protein-calorie malnutrition ([place])   Hyponatremia   Hypomagnesemia   Acute blood loss anemia   Postoperative pain    Expected Discharge Date: Expected Discharge Date: 02/15/21  Team Members Present: Physician leading conference: Dr. Courtney Heys Social Worker Present: Loralee Pacas, [place] Nurse Present: Dorthula Nettles, RN PT Present: Excell Seltzer, PT OT Present: Roanna Epley, COTA;Jennifer Tamala Julian, OT PPS Coordinator present : Ileana Ladd, PT     Current Status/Progress Goal Weekly Team Focus  Bowel/Bladder   Continent Of Bowel, Difficulty Initiating urine Stream  Regain Bladder continence  time toileting schedule.   Swallow/Nutrition/ Hydration             ADL's   functional transfers-CGA/min A; bahting-mod Al toileting-mod A; dressing-mod A  supervision/mod I overall  activity tolerance, BADL training, functional transfers, education   Mobility   mod A bed mobility, CGA to min A transfer with RW  Supervision to mod I overall  pain management, therapy tolerance   Communication             Safety/Cognition/ Behavioral Observations            Pain   5/10 Lower Back  Pain <3  assess q 4hr and prn   Skin   Incision to lower back. Minimal drain  No new skin breakdown.  assess q shift and prn. daily dressing change to back when not provided by hydrotherapy.     Discharge Planning:  D/c to home with his  family. BIL will be with him during the day; wife home in the evening since she works during the day as a Quarry manager.   Team Discussion: On fluid restriction but had 3 drinks at bedside. Still in a lot of pain. Adjusted medications. Started IVF for 12 hrs due to BUN elevated. Cr is better. Negative for UTI. Recheck labs on Thursday, if elevated with consult ID. Pain reported 7/10 and not controlled. Wound vac in place and WOC change MWF. Will go home on IV antibiotics. I&O cath q 6 hr. Pain in stomach likely due to constipation. Staff to ensure that wound vac is plugged into wall outlet and charged at all times. Going home with spouse. Patient's brother who lives with them, will be home during the day to provide care while spouse works. Patient on target to meet rehab goals: yes, mod assist with bed mobility. Min assist STS with elevated surface. Pain eases when lying down. ADL's at sink level. Donned pants with assistive equipment. 15/7 due to pain limiting therapy sessions.   *See Care Plan and progress notes for long and short-term goals.   Revisions to Treatment Plan:  Adjusting medications, IVF for 12 hrs. Recheck labs Thursday.   Teaching Needs: Family education, medication management, pain management, skin/wound care, IV antibiotic education, safety awareness.  Current Barriers to Discharge: Decreased caregiver support, Home enviroment access/layout, IV antibiotics, Incontinence, Neurogenic bowel and bladder,  Wound care, Lack of/limited family support, Weight, Weight bearing restrictions, Medication compliance, and Behavior  Possible Resolutions to Barriers: Family education, continue current medications, time toileting schedule, assist to stand to void, monitor labs, and monitor for constipation.     Medical Summary Current Status: no UTI- cannot void- neurogenic bladder- urinary retention- caths q6 hours; R low back pain- wound VaccM/W/F- bacteremia- painnot controlled- sloeeping OK- Na 127-  starting IV fluids  Barriers to Discharge: Home enviroment access/layout;Medical stability;Neurogenic Bowel & Bladder;Weight;Wound care;Other (comments);Weight bearing restrictions;Behavior;IV antibiotics  Barriers to Discharge Comments: wound VAC changed M/W/F- - therapy- 15/7; on IV ABX for 6 weeks total; Possible Resolutions to [employer]: increase ms contin to 30 mg BID and increase flexeril to 10 mg TID; home with brother/wife; pain is biggest limiting factor; missing a lot of therapy due to pain; d/c- 11/4   Continued Need for Acute Rehabilitation Level of Care: The patient requires daily medical management by a physician with specialized training in physical medicine and rehabilitation for the following reasons: Direction of a multidisciplinary physical rehabilitation program to maximize functional independence : Yes Medical management of patient stability for increased activity during participation in an intensive rehabilitation regime.: Yes Analysis of laboratory values and/or radiology reports with any subsequent need for medication adjustment and/or medical intervention. : Yes   I attest that I was present, lead the team conference, and concur with the assessment and plan of the team.   Cristi Loron 02/05/2021, 2:51 PM

## 2021-02-05 NOTE — Progress Notes (Signed)
Pt has hospital CPAP and has home tubing and home mask. Pt stated he can place self on CPAP.  No 02 required. Pt instructed to have RN call RT if any assistance needed.

## 2021-02-05 NOTE — Progress Notes (Signed)
[place] PHYSICAL MEDICINE & REHABILITATION PROGRESS NOTE  Subjective/Complaints:  Pt's NA 127- but got HCTZ yesterday- on fluid restriction, but 3 bottles of water at bedside.  Pt reports Tom worked on R low back muscle spasm- but still a problem- hurts "SO bad" when gets OOB/out of w/c.   It's the main cause of pain.    ROS:   Pt denies SOB, abd pain, CP, N/V/C/D, and vision changes   Objective: Vital Signs: Blood pressure 119/69, pulse (!) 105, temperature 98.9 F (37.2 C), temperature source Oral, resp. rate 18, height '6\' 2"'  (1.88 m), weight (!) 142.3 kg, SpO2 95 %. No results found. Recent Labs    02/04/21 0316  WBC 8.9  HGB 8.2*  HCT 25.2*  PLT 326   Recent Labs    02/04/21 0316 02/05/21 0410  NA 128* 127*  K 3.7 3.6  CL 92* 92*  CO2 27 27  GLUCOSE 178* 138*  BUN 29* 29*  CREATININE 1.32* 1.13  CALCIUM 8.5* 8.6*    Intake/Output Summary (Last 24 hours) at 02/05/2021 1008 Last data filed at 02/05/2021 0530 Gross per 24 hour  Intake 361.81 ml  Output 4050 ml  Net -3688.19 ml        Physical Exam: BP 119/69 (BP Location: Left Arm)   Pulse (!) 105   Temp 98.9 F (37.2 C) (Oral)   Resp 18   Ht '6\' 2"'  (1.88 m)   Wt (!) 142.3 kg   SpO2 95%   BMI 40.28 kg/m     General: awake, alert, appropriate, sitting up in chair; PT in room; NAD HENT: conjugate gaze; oropharynx moist CV: regular rate; no JVD Pulmonary: CTA B/L; no W/R/R- good air movement GI: soft, NT, ND, (+)BS- protuberant Psychiatric: appropriate Neurological: alert  Skin: Warm and dry.  + Wound VAC to back- looks the same Musc: very TTP R low back - not c/w with CVA tenderness- also has associated muscle spasms.  Neuro: Alert Motor: Bilateral upper extremities: 5/5 proximal distal LE- HF 4/5; KE/KF 4+/5; Df/PF 4+/5  Sensation intact  Assessment/Plan: 1. Functional deficits which require 3+ hours per day of interdisciplinary therapy in a comprehensive inpatient rehab  setting. Physiatrist is providing close team supervision and 24 hour management of active medical problems listed below. Physiatrist and rehab team continue to assess barriers to discharge/monitor patient progress toward functional and medical goals   Care Tool:  Bathing    Body parts bathed by patient: Right arm, Left arm, Chest, Abdomen, Left upper leg, Right upper leg, Face   Body parts bathed by helper: Buttocks, Right lower leg, Left lower leg     Bathing assist Assist Level: Moderate Assistance - Patient 50 - 74%     Upper Body Dressing/Undressing Upper body dressing   What is the patient wearing?: Hospital gown only    Upper body assist Assist Level: Minimal Assistance - Patient > 75%    Lower Body Dressing/Undressing Lower body dressing      What is the patient wearing?: Pants     Lower body assist Assist for lower body dressing: Moderate Assistance - Patient 50 - 74%     Toileting Toileting    Toileting assist Assist for toileting: 2 Helpers     Transfers Chair/bed transfer  Transfers assist  Chair/bed transfer activity did not occur: Safety/medical concerns  Chair/bed transfer assist level: Contact Guard/Touching assist     Locomotion Ambulation   Ambulation assist   Ambulation activity did not occur: Safety/medical  concerns  Assist level: Contact Guard/Touching assist Assistive device: Walker-rolling Max distance: 50'   Walk 10 feet activity   Assist  Walk 10 feet activity did not occur: Safety/medical concerns  Assist level: Contact Guard/Touching assist Assistive device: Walker-rolling   Walk 50 feet activity   Assist Walk 50 feet with 2 turns activity did not occur: Safety/medical concerns  Assist level: Contact Guard/Touching assist Assistive device: Walker-rolling    Walk 150 feet activity   Assist Walk 150 feet activity did not occur: Safety/medical concerns         Walk 10 feet on uneven surface   activity   Assist Walk 10 feet on uneven surfaces activity did not occur: Safety/medical concerns         Wheelchair     Assist Is the patient using a wheelchair?: Yes   Wheelchair activity did not occur: Safety/medical concerns         Wheelchair 50 feet with 2 turns activity    Assist    Wheelchair 50 feet with 2 turns activity did not occur: Safety/medical concerns       Wheelchair 150 feet activity     Assist  Wheelchair 150 feet activity did not occur: Safety/medical concerns        Medical Problem List and Plan: 1.  Lumbar radiculopathy/myelopathy secondary to nerve root compression- s/p decompression and wash out after bacteremia from klebsiella pneumonia Con't PT and OT/CIR_ move to 15/7 due to difficulty with endurance  -con't PT and OT_ team conference today to determine length of stay 2.  Impaired mobility: continue Lovenox             -antiplatelet therapy: N/A 3. Postoperative pain: Continue Oxycodone 15 mg prn. Resumed MSContin for more consistent pain relief. Scheduled Tizanidine TID. Resumed muscle rub to right thigh as it was helping before.              Changed IV dilaudid to prior to VAC changes.    10/25- will increase Flexeril to 10 mg TID prn; and increase MS contin to 30 mg BID- see how that works/monitor  Monitor with increased exertion 4. Mood: Team to provide ego support. LCSW to follow for evaluation and support.              -antipsychotic agents: N/A 5. Neuropsych: This patient is capable of making decisions on his own behalf. 6. Skin/Wound Care:  Monitor wound daily. Added vitamins and protein supplement to help promote wound healing.  7. Fluids/Electrolytes/Nutrition: Monitor I/Os 8. Klebsiella bacteremia w/diskitis: On Cefazolin 2 g every 8 hrs with end date 03/11/21 -- Check weekly CRP/ESR.   10/24- CRP up slightly and ESR up- think could have UTI- if doesn't will call ID.  9. HTN: Monitor BP tid--continue Microzide  and cozaar daily.  10/24- d/c HCTZ due to low Na  Monitor with increased mobility 10. T2DM with hyperglycemia: CM diet. On Metformin 1000 mg bid  10/24- BG's around 150s- but occ bumps up higher- con't regimen  Monitor with increased activity 11. Neurogenic bladder: with urinary retention On flomax. Increased to 0.8 mg nightly   10/24- still not voiding- will check U/A and Cx 13. Neuropathy: Continue Neurontin 600 mg TID.  14. ABLA: monitor for signs of bleeding. Hgb stabilizing to 8 range.  Hb 8.9 on 10/21 Cont to monitor 15. 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 -- Follow up with surgery after discharge.  16. Endstage OA R-knee:  Added voltaren gel  qid.  17. Abdominal pain: Much better--dysesthesias?  18. Hypomagnesemia: Resumed Mag Ox.   Mag 1.6 on 10/21, labs ordered for Monday  10/24- Mg 2.1 19. Hyponatremia  Na+ 132 on 10/21, labs ordered for Monday  10/24- Na down to 128- will do fluid restriction 1500cc- and stop HCTZ  10/25- Na 127- will recheck in AM after off HCTZ and on fluid restriction 20. Hypoalbuminemia  Supplement initiated on 10/21 21. Azotemia/ARI  10/24- Cr up to 1.32- usually <1- BUN 29- will recheck in AM and if worse, will start more IVFs  10/25 Cr down to 1.13 and BUN stable at 29- will give IVFs 75 cc x 12 hours and recheck in AM 22 Constipation  10/24- will give Sorbitol 66 and increase bowel meds- increase Senokot to 2 tabs BID.     LOS: 5 days A FACE TO FACE EVALUATION WAS PERFORMED  [name] 02/05/2021, 10:08 AM

## 2021-02-05 NOTE — Progress Notes (Signed)
Occupational Therapy Session Note  Patient Details  Name: [name] MRN: [id number] Date of Birth: [birth year]  Today's Date: 02/05/2021 OT Individual Time: 0700-0755 OT Individual Time Calculation (min): 55 min    Short Term Goals: Week 1:  OT Short Term Goal 1 (Week 1): Pt will don B shoes wiht MIN cuing for AE technique OT Short Term Goal 2 (Week 1): Pt will complete ambulatory toilet transfer with CGA consistently + LRAD. OT Short Term Goal 3 (Week 1): Pt will bathe wiht S  Skilled Therapeutic Interventions/Progress Updates:    Pt resting in bed upon arrival eating breakfast. Pt agreeable to getting OOB. Declined use of bathroom. Supine>sit EOB with supervision using bedrails. Bed elevated with pt sitting EOB to relieve back pain. Soft tissue mobilizations to Rt lower back (QL) with some relief noted. Stand pivot tranfser to w/c with CGA. Pt completed UB bathing and grooming tasks seated in w/c at sink. LB dressing with reacher with min A. Pt remained seated in w/c with all needs within reach.   Therapy Documentation Precautions:  Precautions Precautions: Fall, Back, Other (comment) Precaution Booklet Issued: No Precaution Comments: wound vac, pt able to recall back precautions Required Braces or Orthoses: Other Brace Other Brace: no brace needed per order, d/t wound vac Restrictions Weight Bearing Restrictions: No  Pain: Pt c/o Rt lower back pain lateral to incision; per report relieved with soft tissue mobilizations  Therapy/Group: Individual Therapy  [name] 02/05/2021, 8:06 AM

## 2021-02-05 NOTE — Progress Notes (Signed)
Physical Therapy Note  Patient Details  Name: [name] MRN: [id number] Date of Birth: [birth year] Today's Date: 02/05/2021    Attempted to see patient for scheduled therapy session at 1445, pt reports he is in 8/10 pain in his back and wants to receive pain medication and muscle relaxers prior to participation in therapy session. Per nursing pt able to receive pain medication at this time but not due for muscle relaxers until 1600. Pt agreeable to therapist return at 1630 to attempt participation in session. This therapist returns at 1630 and pt reports he just got comfortable and does not want to increase his pain, declines participation at this time. Pt missed 45 min of scheduled therapy session due to pain this date.    Excell Seltzer, PT, DPT, CSRS  02/05/2021, 5:31 PM

## 2021-02-06 ENCOUNTER — Encounter (HOSPITAL_COMMUNITY): Payer: Self-pay | Admitting: Neurological Surgery

## 2021-02-06 LAB — BASIC METABOLIC PANEL
Anion gap: 8 (ref 5–15)
BUN: 29 mg/dL — ABNORMAL HIGH (ref 6–20)
CO2: 26 mmol/L (ref 22–32)
Calcium: 8.6 mg/dL — ABNORMAL LOW (ref 8.9–10.3)
Chloride: 95 mmol/L — ABNORMAL LOW (ref 98–111)
Creatinine, Ser: 1.21 mg/dL (ref 0.61–1.24)
GFR, Estimated: >60 mL/min (ref >60)
Glucose, Bld: 164 mg/dL — ABNORMAL HIGH (ref 70–99)
Potassium: 3.9 mmol/L (ref 3.5–5.1)
Sodium: 129 mmol/L — ABNORMAL LOW (ref 135–145)

## 2021-02-06 LAB — GLUCOSE, CAPILLARY
Glucose-Capillary: 147 mg/dL — ABNORMAL HIGH (ref 70–99)
Glucose-Capillary: 219 mg/dL — ABNORMAL HIGH (ref 70–99)

## 2021-02-06 MED ORDER — SORBITOL 70 % SOLN
30.0000 mL | Freq: Every day | Status: DC | PRN
  Administered 2021-02-06 – 2021-03-02 (×6): 30 mL via ORAL
  Filled 2021-02-06 (×5): qty 30

## 2021-02-06 MED ORDER — POLYETHYLENE GLYCOL 3350 17 G PO PACK
17.0000 g | PACK | Freq: Every day | ORAL | Status: DC
  Administered 2021-02-07 – 2021-03-02 (×23): 17 g via ORAL
  Filled 2021-02-06 (×22): qty 1

## 2021-02-06 MED ORDER — BOOST PLUS PO LIQD
237.0000 mL | Freq: Three times a day (TID) | ORAL | Status: DC
  Administered 2021-02-06 – 2021-03-03 (×33): 237 mL via ORAL
  Filled 2021-02-06 (×80): qty 237

## 2021-02-06 MED ORDER — MORPHINE SULFATE ER 15 MG PO TBCR
15.0000 mg | EXTENDED_RELEASE_TABLET | Freq: Two times a day (BID) | ORAL | Status: DC
  Administered 2021-02-06 – 2021-02-12 (×13): 15 mg via ORAL
  Filled 2021-02-06 (×12): qty 1

## 2021-02-06 MED ORDER — CITALOPRAM HYDROBROMIDE 20 MG PO TABS
20.0000 mg | ORAL_TABLET | Freq: Every day | ORAL | Status: DC
  Administered 2021-02-06 – 2021-02-14 (×9): 20 mg via ORAL
  Filled 2021-02-06 (×10): qty 1

## 2021-02-06 NOTE — Progress Notes (Signed)
Pt states he will not be wearing CPAP for the night

## 2021-02-06 NOTE — Progress Notes (Signed)
Occupational Therapy Note  Patient Details  Name: [name] MRN: [id number] Date of Birth: [birth year]  Today's Date: 02/06/2021 OT Missed Time: 75 Minutes Missed Time Reason: Pain;Patient fatigue  Pt in bed upon arrival with eyes closed. Pt's breakfast on table over bed but untouched. Pt reports "they just loaded me up with meds." Attempted to reposition bed to facilitate pt eating breakfast but pt unable to tolerate HOB elevated past 10*. Pt unable to keep eyes open and speech was barely intelligible. Pt unable to participate in therapy. Will attempt to see pt later. Pt missed 75 mins skilled OT services.   Leotis Shames [facility] 02/06/2021, 7:40 AM

## 2021-02-06 NOTE — Consult Note (Addendum)
[place] Nurse wound follow up Patient receiving care in [facility] 4W16 Dr. Dagoberto Ligas present for dressing removal. Wound type: Surgical lumbar wound Wound bed: beefy red granulation tissue with minimal yellow fibrinous tissue throughout the wound.  Measurements: 22 x 7 x 3.5 Drainage (amount, consistency, odor) Serosanguinous  in the canister. Periwound: minor maceration around the edges of the wound with some blistering.  Dressing procedure/placement/frequency: 3 pieces of black foam removed. Used 39M skin barrier wipes on the surrounding skin. 1 large piece of black foam replaced. Bridged over to the right of the wound using 2 pieces of black foam. 3 pieces of black foam used all together. Drape applied, immediate suction obtained at 125 mmHg. Patient sits up on the side of the bed for the dressing change.  Supplies at bedside.   PHOTO TAKEN BY DR Dagoberto Ligas     WOC will follow M W F for vac dressing changes.   Cathlean Marseilles Tamala Julian, MSN, RN, [place], Lysle Pearl, [facility] Wound Treatment Associate Pager [phone]

## 2021-02-06 NOTE — Progress Notes (Signed)
[facility] PHYSICAL MEDICINE & REHABILITATION PROGRESS NOTE  Subjective/Complaints:  Pt reports very depr4essed- all of this is getting to him-  Also feels too sleepy/out of it this AM- just woke him up, but c/o slurred speech.  Asking for something to help mood.   Also went back to room for [facility] change with WOC- pics as below.    ROS:   Pt denies SOB, abd pain, CP, N/V/C/D, and vision changes   Objective: Vital Signs: Blood pressure 109/65, pulse (!) 110, temperature 98.5 F (36.9 C), temperature source Oral, resp. rate 16, height '6\' 2"'  (1.88 m), weight (!) 140 kg, SpO2 98 %. No results found. Recent Labs    02/04/21 0316  WBC 8.9  HGB 8.2*  HCT 25.2*  PLT 326   Recent Labs    02/05/21 0410 02/06/21 0417  NA 127* 129*  K 3.6 3.9  CL 92* 95*  CO2 27 26  GLUCOSE 138* 164*  BUN 29* 29*  CREATININE 1.13 1.21  CALCIUM 8.6* 8.6*    Intake/Output Summary (Last 24 hours) at 02/06/2021 0944 Last data filed at 02/06/2021 0556 Gross per 24 hour  Intake 1661.47 ml  Output 2150 ml  Net -488.53 ml        Physical Exam: BP 109/65 (BP Location: Left Arm)   Pulse (!) 110   Temp 98.5 F (36.9 C) (Oral)   Resp 16   Ht '6\' 2"'  (1.88 m)   Wt (!) 140 kg   SpO2 98%   BMI 39.63 kg/m      General: awake, alert, appropriate, depressed affect; slightly slurred speech; supine in bed- also seen got himself to EOB for wound vac change; NAD HENT: conjugate gaze; oropharynx moist CV: regular  rhythm; slightly tachycardic rate; no JVD Pulmonary: CTA B/L; no W/R/R- good air movement GI: soft, NT, ND, (+)BS- protuberant Psychiatric: appropriate but flat, depressed Neurological: alert, slurred speech resolved by 2nd visit  Skin: Warm and dry. Wound VAC off- pic as below- doesn't look as deep red as picture Musc: very TTP R low back - not c/w with CVA tenderness- also has associated muscle spasms.  Neuro: Alert Motor: Bilateral upper extremities: 5/5 proximal distal LE- HF  4/5; KE/KF 4+/5; Df/PF 4+/5  Sensation intact     Assessment/Plan: 1. Functional deficits which require 3+ hours per day of interdisciplinary therapy in a comprehensive inpatient rehab setting. Physiatrist is providing close team supervision and 24 hour management of active medical problems listed below. Physiatrist and rehab team continue to assess barriers to discharge/monitor patient progress toward functional and medical goals   Care Tool:  Bathing    Body parts bathed by patient: Right arm, Left arm, Chest, Abdomen, Left upper leg, Right upper leg, Face   Body parts bathed by helper: Buttocks, Right lower leg, Left lower leg     Bathing assist Assist Level: Moderate Assistance - Patient 50 - 74%     Upper Body Dressing/Undressing Upper body dressing   What is the patient wearing?: Hospital gown only    Upper body assist Assist Level: Minimal Assistance - Patient > 75%    Lower Body Dressing/Undressing Lower body dressing      What is the patient wearing?: Pants     Lower body assist Assist for lower body dressing: Moderate Assistance - Patient 50 - 74%     Toileting Toileting    Toileting assist Assist for toileting: 2 Helpers     Transfers Chair/bed transfer  Transfers assist  Chair/bed transfer activity did not occur: Safety/medical concerns  Chair/bed transfer assist level: Contact Guard/Touching assist     Locomotion Ambulation   Ambulation assist   Ambulation activity did not occur: Safety/medical concerns  Assist level: Contact Guard/Touching assist Assistive device: Walker-rolling Max distance: 50'   Walk 10 feet activity   Assist  Walk 10 feet activity did not occur: Safety/medical concerns  Assist level: Contact Guard/Touching assist Assistive device: Walker-rolling   Walk 50 feet activity   Assist Walk 50 feet with 2 turns activity did not occur: Safety/medical concerns  Assist level: Contact Guard/Touching  assist Assistive device: Walker-rolling    Walk 150 feet activity   Assist Walk 150 feet activity did not occur: Safety/medical concerns         Walk 10 feet on uneven surface  activity   Assist Walk 10 feet on uneven surfaces activity did not occur: Safety/medical concerns         Wheelchair     Assist Is the patient using a wheelchair?: Yes   Wheelchair activity did not occur: Safety/medical concerns         Wheelchair 50 feet with 2 turns activity    Assist    Wheelchair 50 feet with 2 turns activity did not occur: Safety/medical concerns       Wheelchair 150 feet activity     Assist  Wheelchair 150 feet activity did not occur: Safety/medical concerns        Medical Problem List and Plan: 1.  Lumbar radiculopathy/myelopathy secondary to nerve root compression- s/p decompression and wash out after bacteremia from klebsiella pneumonia Con't PT and OT/CIR_ move to 15/7 due to difficulty with endurance  Con't PT and OT/CIR- d/c date set for 02/15/21- will go home with VAC and IV ABX.  2.  Impaired mobility: continue Lovenox             -antiplatelet therapy: N/A 3. Postoperative pain: Continue Oxycodone 15 mg prn. Resumed MSContin for more consistent pain relief. Scheduled Tizanidine TID. Resumed muscle rub to right thigh as it was helping before.              Changed IV dilaudid to prior to VAC changes.    10/25- will increase Flexeril to 10 mg TID prn; and increase MS contin to 30 mg BID- see how that works/monitor  10/26- more sleepy/out of it- willl decrease MS contin back to 15 mg BID  Monitor with increased exertion 4. Mood: Team to provide ego support. LCSW to follow for evaluation and support.              -antipsychotic agents: N/A 5. Neuropsych: This patient is capable of making decisions on his own behalf. 6. Skin/Wound Care:  Monitor wound daily. Added vitamins and protein supplement to help promote wound healing.  7.  Fluids/Electrolytes/Nutrition: Monitor I/Os 8. Klebsiella bacteremia w/diskitis: On Cefazolin 2 g every 8 hrs with end date 03/11/21 -- Check weekly CRP/ESR.   10/24- CRP up slightly and ESR up- think could have UTI- if doesn't will call ID.  9. HTN: Monitor BP tid--continue Microzide and cozaar daily.  10/24- d/c HCTZ due to low Na  Monitor with increased mobility 10. T2DM with hyperglycemia: CM diet. On Metformin 1000 mg bid  10/26- BG's 140s-160s usually- con't regimen  Monitor with increased activity 11. Neurogenic bladder: with urinary retention On flomax. Increased to 0.8 mg nightly   10/24- still not voiding- will check U/A and Cx 10/26- no voiding- U/A (-)  13. Neuropathy: Continue Neurontin 600 mg TID.  14. ABLA: monitor for signs of bleeding. Hgb stabilizing to 8 range.  Hb 8.9 on 10/21 Cont to monitor 15. 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 -- Follow up with surgery after discharge.  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  17. Abdominal pain: Much better--dysesthesias?  18. Hypomagnesemia: Resumed Mag Ox.   Mag 1.6 on 10/21, labs ordered for Monday  10/24- Mg 2.1 19. Hyponatremia  Na+ 132 on 10/21, labs ordered for Monday  10/24- Na down to 128- will do fluid restriction 1500cc- and stop HCTZ  10/25- Na 127- will recheck in AM after off HCTZ and on fluid restriction  10/26- Na up to 129- con't fluid restriction and recheck Friday 20. Hypoalbuminemia  Supplement initiated on 10/21 21. Azotemia/ARI  10/24- Cr up to 1.32- usually <1- BUN 29- will recheck in AM and if worse, will start more IVFs  10/25 Cr down to 1.13 and BUN stable at 29- will give IVFs 75 cc x 12 hours and recheck in AM  10/26- BUN still 29 and Cr 1.21- will recheck Friday- might need more IVFs at that time.  22 Constipation  10/24- will give Sorbitol 66 and increase bowel meds- increase Senokot to 2 tabs BID.   I spent a total of 41 minutes on total care- >50%  coordination of care-seeing pt twice and seeing wound VAC change and d/w WOC nurse.    LOS: 6 days A FACE TO FACE EVALUATION WAS PERFORMED  Eddison Searls 02/06/2021, 9:44 AM

## 2021-02-06 NOTE — Progress Notes (Signed)
Physical Therapy Session Note  Patient Details  Name: NICKO DAHER MRN: [id number] Date of Birth: [birth year]  Today's Date: 02/06/2021 PT Individual Time: 1330-1405 PT Individual Time Calculation (min): 35 min   Short Term Goals: Week 1:  PT Short Term Goal 1 (Week 1): Pt will tolerate OOB activity x 30 min PT Short Term Goal 2 (Week 1): Pt will perform standing transfers with consistent CGA. PT Short Term Goal 3 (Week 1): Pt will ambulate at least 30 feet consistently with CGA and LRAD.  Skilled Therapeutic Interventions/Progress Updates: Pt presented in bed with nsg present. Pt just receiving pain meds and requesting time to allow pain meds to start working. PTA advised will return in 30 min.  Upon PTA arrival in 30 min pt laying in bed actively vomiting. Due to pt's position pt at risk for aspiration. PTA raised pt's head and once pt done pt agreeable to sit at EOB to clean up. Pt performed supine to sit with supervision and heavy use of bed features. Pt was able to clean face, shoulders, and neck and change gown with set up, PTA doffed gown and pt was able to don new gown with minA due to IV. While sitting EOB pt indicated may need to have BM. Performed Sit to stand from significantly elevated bed with CGA and ambulated to bathroom with CGA. PTA noted that pt ambulating with B knees flexed. Toilet transfers with CGA but -BM. Pt agreeable to sit in recliner for some time to allow staff to change linens. Pt ambulating to recliner and set up with pillows and w/c cushion to increase height. Pt left in recliner with nsg present, call bell within reach and needs met.      Therapy Documentation Precautions:  Precautions Precautions: Fall, Back, Other (comment) Precaution Booklet Issued: No Precaution Comments: wound vac, pt able to recall back precautions Required Braces or Orthoses: Other Brace Other Brace: no brace needed per order, d/t wound vac Restrictions Weight Bearing Restrictions:  No General:   Vital Signs: Therapy Vitals Temp: 98.4 F (36.9 C) Pulse Rate: (!) 108 Resp: 18 BP: 106/61 Patient Position (if appropriate): Lying Oxygen Therapy SpO2: 94 % O2 Device: Room Air Therapy/Group: Individual Therapy  Ambre Kobayashi Cace Osorto, PTA  02/06/2021, 3:50 PM

## 2021-02-06 NOTE — Progress Notes (Signed)
Physical Therapy Session Note  Patient Details  Name: [name] MRN: [id number] Date of Birth: [birth year]  Today's Date: 02/06/2021 PT Individual Time: 1630-1700 PT Individual Time Calculation (min): 30 min   Short Term Goals: Week 1:  PT Short Term Goal 1 (Week 1): Pt will tolerate OOB activity x 30 min PT Short Term Goal 2 (Week 1): Pt will perform standing transfers with consistent CGA. PT Short Term Goal 3 (Week 1): Pt will ambulate at least 30 feet consistently with CGA and LRAD.  Skilled Therapeutic Interventions/Progress Updates:    Pt received supine in bed, agreeable with encouragement to participate in therapy session. Pt reports 9/10 pain in low back at rest. Supine to sit with Supervision with HOB elevated and use of bedrail. Pt reports increase in pain to "12/10". Applied muscle cream to R lower back at site of pain. Palpable trigger points in this region, performed trigger point massage with minimal change in tightness noted. Pt agreeable to "do what I need to do" this session. Sit to stand with CGA to RW from elevated bed during session. Ambulation 2 x 100 ft with RW and CGA around pt room. Pt requests to return to bed at end of session due to pain. Sit to supine mod A needed for BLE management. Pt left in L sidelying in bed with needs in reach, bed alarm in place.  Therapy Documentation Precautions:  Precautions Precautions: Fall, Back, Other (comment) Precaution Booklet Issued: No Precaution Comments: wound vac, pt able to recall back precautions Required Braces or Orthoses: Other Brace Other Brace: no brace needed per order, d/t wound vac Restrictions Weight Bearing Restrictions: No      Therapy/Group: Individual Therapy   Excell Seltzer, PT, DPT, CSRS  02/06/2021, 5:21 PM

## 2021-02-06 NOTE — Progress Notes (Addendum)
Patient ID: [name], male   DOB: October 14, 1965, 55 y.o.   MRN: [id number]  SW sent HHPT/OTSN/aide referral to [facility] and waiting on follow-up. *referral declined due to staffing.   SW sent referral to [facility] and waiting on follow-up.  [place], MSW, [place] Office: [phone] Cell: [phone] Fax: [phone]

## 2021-02-07 MED ORDER — DIAZEPAM 5 MG PO TABS
5.0000 mg | ORAL_TABLET | Freq: Every day | ORAL | Status: DC
  Administered 2021-02-07 – 2021-03-07 (×29): 5 mg via ORAL
  Filled 2021-02-07 (×29): qty 1

## 2021-02-07 MED ORDER — SORBITOL 70 % SOLN
60.0000 mL | Freq: Once | Status: AC
  Administered 2021-02-07: 60 mL via ORAL
  Filled 2021-02-07: qty 60

## 2021-02-07 NOTE — Progress Notes (Signed)
Patient ID: [name], male   DOB: 1966-03-12, 55 y.o.   MRN: [id number]  SW called [facility] in efforts to schedule wound care appointment, and no availability until 3rd week of December. SW left message for [facility] [phone]) and waiting on follow-up.   SW spoke with [facility]/ [facility] [phone]: [phone]) to schedule appointment. Pt will only be able to be seen 2xs a week for wound vac. Appt scheduled for Monday, November 7 at 9am with Dr. Welton Flakes.   *SW met with pt wife to inform on some challenges with obtaining HH and will continue to explore options. SW shared if unable to secure [facility], pt will go to wound care clinic. SW received message from [facility] wound care clinic and unable to accept referral due to staffing.   SW faxed clinicals to Dr. Welton Flakes at [facility].   HHAs Declined: [facility]

## 2021-02-07 NOTE — Progress Notes (Signed)
   02/07/21 1310  Assess: MEWS Score  Temp 98.7 F (37.1 C)  BP 113/60  Pulse Rate (!) 118  Resp 20  SpO2 99 %  O2 Device Room Air  Assess: MEWS Score  MEWS Temp 0  MEWS Systolic 0  MEWS Pulse 2  MEWS RR 0  MEWS LOC 0  MEWS Score 2  MEWS Score Color Yellow  Assess: if the MEWS score is Yellow or Red  Were vital signs taken at a resting state? Yes  Focused Assessment No change from prior assessment  Early Detection of Sepsis Score *See Row Information* Low  MEWS guidelines implemented *See Row Information* No, vital signs rechecked  Treat  MEWS Interventions Other (Comment) (Dan notified. THis is the patients baseline VS no additional VS at this time)  Pain Scale 0-10  Pain Score 4  Pain Type Chronic pain  Pain Location Back  Pain Orientation Lower;Medial  Notify: Charge Nurse/RN  Name of Charge Nurse/RN Notified Mckides  Date Charge Nurse/RN Notified 02/07/21  Time Charge Nurse/RN Notified 12  Notify: Provider  Provider Name/Title Dan  Date Provider Notified 02/07/21  Time Provider Notified 1310  Notification Type Call  Notification Reason  ([place])  Provider response No new orders  Date of Provider Response 02/07/21  Time of Provider Response 1350  Document  Patient Outcome Stabilized after interventions  Progress note created (see row info) Yes

## 2021-02-07 NOTE — Progress Notes (Signed)
Occupational Therapy Weekly Progress Note  Patient Details  Name: [name] MRN: [id number] Date of Birth: 1966/04/07  Beginning of progress report period: February 01, 2021 End of progress report period: February 07, 2021  Patient has met 2 of 3 short term goals.  Pt is making slow progress towards mod I LTG. Pt limited by ongoing pain with activity. Functional amnb with RW and transfers with CGA. Toileting with CGA. Pt requires assistance donning shoes but completes bathing/dressing tasks with CGA.   Patient continues to demonstrate the following deficits: muscle weakness and acute pain, requiring wound care, impaired timing and sequencing and unbalanced muscle activation, and decreased standing balance and decreased balance strategies and therefore will continue to benefit from skilled OT intervention to enhance overall performance with BADL and Reduce care partner burden.  Patient progressing toward long term goals..  Continue plan of care.  OT Short Term Goals Week 1:  OT Short Term Goal 1 (Week 1): Pt will don B shoes wiht MIN cuing for AE technique OT Short Term Goal 1 - Progress (Week 1): Progressing toward goal OT Short Term Goal 2 (Week 1): Pt will complete ambulatory toilet transfer with CGA consistently + LRAD. OT Short Term Goal 2 - Progress (Week 1): Met OT Short Term Goal 3 (Week 1): Pt will bathe wiht S OT Short Term Goal 3 - Progress (Week 1): Met Week 2:  OT Short Term Goal 1 (Week 2): STG=LTG 2/2 ELOS (continue working towards supervision/mod I LTG) Week 3:       Therapy/Group: Individual Therapy  [name] 02/07/2021, 7:17 AM

## 2021-02-07 NOTE — Progress Notes (Signed)
[facility] PHYSICAL MEDICINE & REHABILITATION PROGRESS NOTE  Subjective/Complaints:   Pt reports constipated- admits had small BM with enema yesterday- but per wife, not a lot of results- will give sorbitol since needs to be cleaned out.     ROS:   Pt denies SOB, abd pain, CP, N/V/C/D, and vision changes Depressed- upset about pain   Objective: Vital Signs: Blood pressure 100/61, pulse 93, temperature 98.1 F (36.7 C), temperature source Oral, resp. rate 18, height '6\' 2"'  (1.88 m), weight (!) 140 kg, SpO2 95 %. No results found. No results for input(s): WBC, HGB, HCT, PLT in the last 72 hours.  Recent Labs    02/05/21 0410 02/06/21 0417  NA 127* 129*  K 3.6 3.9  CL 92* 95*  CO2 27 26  GLUCOSE 138* 164*  BUN 29* 29*  CREATININE 1.13 1.21  CALCIUM 8.6* 8.6*    Intake/Output Summary (Last 24 hours) at 02/07/2021 1029 Last data filed at 02/07/2021 0750 Gross per 24 hour  Intake 360 ml  Output 1200 ml  Net -840 ml        Physical Exam: BP 100/61 (BP Location: Left Arm)   Pulse 93   Temp 98.1 F (36.7 C) (Oral)   Resp 18   Ht '6\' 2"'  (1.88 m)   Wt (!) 140 kg   SpO2 95%   BMI 39.63 kg/m        General: awake, alert, appropriate, sitting up in bedside chair after therapy; NAD HENT: conjugate gaze; oropharynx moist CV: regular rate; no JVD Pulmonary: CTA B/L; no W/R/R- good air movement GI: soft, NT, somewhat distended; more firm; hypoactive BS Psychiatric: appropriate but very depressed- even worse than yesterday Neurological: Ox3- no more slurred speech  Skin: Warm and dry. Wound VAC off- pic as below- doesn't look as deep red as picture Musc: very TTP R low back -palpable trigger point Neuro: Alert Motor: Bilateral upper extremities: 5/5 proximal distal LE- HF 4/5; KE/KF 4+/5; Df/PF 4+/5  Sensation intact     Assessment/Plan: 1. Functional deficits which require 3+ hours per day of interdisciplinary therapy in a comprehensive inpatient rehab  setting. Physiatrist is providing close team supervision and 24 hour management of active medical problems listed below. Physiatrist and rehab team continue to assess barriers to discharge/monitor patient progress toward functional and medical goals   Care Tool:  Bathing    Body parts bathed by patient: Right arm, Left arm, Chest, Abdomen, Left upper leg, Right upper leg, Face   Body parts bathed by helper: Buttocks, Right lower leg, Left lower leg     Bathing assist Assist Level: Moderate Assistance - Patient 50 - 74%     Upper Body Dressing/Undressing Upper body dressing   What is the patient wearing?: Hospital gown only    Upper body assist Assist Level: Minimal Assistance - Patient > 75%    Lower Body Dressing/Undressing Lower body dressing      What is the patient wearing?: Pants     Lower body assist Assist for lower body dressing: Moderate Assistance - Patient 50 - 74%     Toileting Toileting    Toileting assist Assist for toileting: 2 Helpers     Transfers Chair/bed transfer  Transfers assist  Chair/bed transfer activity did not occur: Safety/medical concerns  Chair/bed transfer assist level: Contact Guard/Touching assist     Locomotion Ambulation   Ambulation assist   Ambulation activity did not occur: Safety/medical concerns  Assist level: Contact Guard/Touching assist Assistive device:  Walker-rolling Max distance: 50'   Walk 10 feet activity   Assist  Walk 10 feet activity did not occur: Safety/medical concerns  Assist level: Contact Guard/Touching assist Assistive device: Walker-rolling   Walk 50 feet activity   Assist Walk 50 feet with 2 turns activity did not occur: Safety/medical concerns  Assist level: Contact Guard/Touching assist Assistive device: Walker-rolling    Walk 150 feet activity   Assist Walk 150 feet activity did not occur: Safety/medical concerns         Walk 10 feet on uneven surface   activity   Assist Walk 10 feet on uneven surfaces activity did not occur: Safety/medical concerns         Wheelchair     Assist Is the patient using a wheelchair?: Yes   Wheelchair activity did not occur: Safety/medical concerns         Wheelchair 50 feet with 2 turns activity    Assist    Wheelchair 50 feet with 2 turns activity did not occur: Safety/medical concerns       Wheelchair 150 feet activity     Assist  Wheelchair 150 feet activity did not occur: Safety/medical concerns        Medical Problem List and Plan: 1.  Lumbar radiculopathy/myelopathy secondary to nerve root compression- s/p decompression and wash out after bacteremia from klebsiella pneumonia Con't PT and OT/CIR_ move to 15/7 due to difficulty with endurance  Con't PT and OT/CIR- d/c date set for 02/15/21- will go home with VAC and IV ABX.  -con't PT and OT_ encouraged pt strongly that needs to work with therapy, regardless of pain- can still progress 2.  Impaired mobility: continue Lovenox  10/27- if not walking enough, might need to go home on it             -antiplatelet therapy: N/A 3. Postoperative pain: Continue Oxycodone 15 mg prn. Resumed MSContin for more consistent pain relief. Scheduled Tizanidine TID. Resumed muscle rub to right thigh as it was helping before.              Changed IV dilaudid to prior to VAC changes.    10/25- will increase Flexeril to 10 mg TID prn; and increase MS contin to 30 mg BID- see how that works/monitor  10/26- more sleepy/out of it- willl decrease MS contin back to 15 mg BID  10/27- will add valium 5 mg nightly for muscle spasms  Monitor with increased exertion 4. Mood: Team to provide ego support. LCSW to follow for evaluation and support.              -antipsychotic agents: N/A 5. Neuropsych: This patient is capable of making decisions on his own behalf. 6. Skin/Wound Care:  Monitor wound daily. Added vitamins and protein supplement to help  promote wound healing.  7. Fluids/Electrolytes/Nutrition: Monitor I/Os 8. Klebsiella bacteremia w/diskitis: On Cefazolin 2 g every 8 hrs with end date 03/11/21 -- Check weekly CRP/ESR.   10/27- will order labs again in AM and if more elevated, will call ID 9. HTN: Monitor BP tid--continue Microzide and cozaar daily.  10/24- d/c HCTZ due to low Na  Monitor with increased mobility 10. T2DM with hyperglycemia: CM diet. On Metformin 1000 mg bid  10/26- BG's 140s-160s usually- con't regimen  Monitor with increased activity 11. Neurogenic bladder: with urinary retention On flomax. Increased to 0.8 mg nightly   10/24- still not voiding- will check U/A and Cx 10/26- no voiding- U/A (-) 13. Neuropathy: Continue  Neurontin 600 mg TID.  14. ABLA: monitor for signs of bleeding. Hgb stabilizing to 8 range.  Hb 8.9 on 10/21 Cont to monitor 15. 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 -- Follow up with surgery after discharge.  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  17. Abdominal pain: Much better--dysesthesias?  18. Hypomagnesemia: Resumed Mag Ox.   Mag 1.6 on 10/21, labs ordered for Monday  10/24- Mg 2.1 19. Hyponatremia  Na+ 132 on 10/21, labs ordered for Monday  10/24- Na down to 128- will do fluid restriction 1500cc- and stop HCTZ  10/25- Na 127- will recheck in AM after off HCTZ and on fluid restriction  10/26- Na up to 129- con't fluid restriction and recheck Friday  10/27- labs ordered for AM 20. Hypoalbuminemia  Supplement initiated on 10/21 21. Azotemia/ARI  10/24- Cr up to 1.32- usually <1- BUN 29- will recheck in AM and if worse, will start more IVFs  10/25 Cr down to 1.13 and BUN stable at 29- will give IVFs 75 cc x 12 hours and recheck in AM  10/26- BUN still 29 and Cr 1.21- will recheck Friday- might need more IVFs at that time.  22 Constipation  10/24- will give Sorbitol 66 and increase bowel meds- increase Senokot to 2 tabs BID.   10/27- will  give aorbitol again today after therapy.     LOS: 7 days A FACE TO FACE EVALUATION WAS PERFORMED  Slaton Reaser 02/07/2021, 10:29 AM

## 2021-02-07 NOTE — Progress Notes (Signed)
Physical Therapy Session Note  Patient Details  Name: [name] MRN: [id number] Date of Birth: 01/28/1966  Today's Date: 02/07/2021 PT Individual Time: 0920-1015 PT Individual Time Calculation (min): 55 min   Short Term Goals: Week 1:  PT Short Term Goal 1 (Week 1): Pt will tolerate OOB activity x 30 min PT Short Term Goal 2 (Week 1): Pt will perform standing transfers with consistent CGA. PT Short Term Goal 3 (Week 1): Pt will ambulate at least 30 feet consistently with CGA and LRAD.  Skilled Therapeutic Interventions/Progress Updates: Pt presented in recliner agreeable to therapy. Pt states pain 8/10 at rest. Pt agreeable to ambulation. Performed Sit to stand with CGA and increased effort with use of one arm on recliner and decreased power up from chair. Pt encourage to increase BLE extension in standing as noted knees flexed and forward flexed posture. Pt ambulated ~6f with RW and CGA with PTA managing IV pole and wound vac. Pt required seated rest after ambulation. Pt requesting application of ms rub to R lower lumbar region. PTA noted several areas TTP and performed trigger release with some relief per pt. PTA also introduced theracane to pt and demonstrated how may be able to hit trigger points for relief when therapy not present. Pt somewhat receptive to use of device and advised best to continue trying as takes practice at times. PTA noted that audible sound from wound vac area. JKennyth Lose RN notified and additional taping applied with some improvement noted. Pt then agreeable to another round of ambulation. Pt able to able to approx same distance with CGA and continued demonstration of B knee flexion. Once completed PTA left room to obtain item and upon PTA return pt with episode to emesis. Pt remained in recliner and was able to wash face with washcloth provided by pt. PTA notified nsg and pt left in recliner with call bell within reach and needs met.      Therapy  Documentation Precautions:  Precautions Precautions: Fall, Back, Other (comment) Precaution Booklet Issued: No Precaution Comments: wound vac, pt able to recall back precautions Required Braces or Orthoses: Other Brace Other Brace: no brace needed per order, d/t wound vac Restrictions Weight Bearing Restrictions: No General:   Vital Signs: Therapy Vitals Temp: 99 F (37.2 C) Temp Source: Oral Pulse Rate: (!) 112 Resp: 18 BP: (!) 112/58 Patient Position (if appropriate): Lying Oxygen Therapy SpO2: 98 % O2 Device: Room Air Pain: Pain Assessment Pain Scale: 0-10 Pain Score: 3  Pain Type: Chronic pain Pain Location: Back Pain Orientation: Lower;Medial Pain Descriptors / Indicators: Aching;Tender;Pressure;Restless;Cramping;Discomfort;Operative site guarding Pain Onset: On-going Patients Stated Pain Goal: 2 Pain Intervention(s): Medication (See eMAR);Back rub;Pain med given for lower pain score than stated, per patient request;Relaxation;Massage;Repositioned Multiple Pain Sites: No 2nd Pain Site Pain Score: 3 Faces Pain Scale: No hurt PAINAD (Pain Assessment in Advanced Dementia) Breathing: normal Negative Vocalization: none Facial Expression: smiling or inexpressive Body Language: relaxed Consolability: no need to console PAINAD Score: 0    Therapy/Group: Individual Therapy  [facility] 02/07/2021, 4:32 PM

## 2021-02-07 NOTE — Progress Notes (Signed)
Occupational Therapy Session Note  Patient Details  Name: [name] MRN: [id number] Date of Birth: [birth year]  Today's Date: 02/07/2021 OT Individual Time: 8592-7639 OT Individual Time Calculation (min): 72 min    Short Term Goals: Week 2:  OT Short Term Goal 1 (Week 2): STG=LTG 2/2 ELOS (continue working towards supervision/mod I LTG)  Skilled Therapeutic Interventions/Progress Updates:    Pt eating breakfast upon arrival. Pt in bed. Supine>sit EOB with HOB elevated with supervision and more then a reasonable amount of time. Soft tissue mobs/myofascial release to Rt lower back (QL) with some relief noted. Sit<>stand with CGA and stand pivot transfer with RW with CGA. Pt positined in recliner for comfort. Sit<>stand X 6 with CGA. Pt reports increased pain with sit<>stand and movment. Pt remained in relciner with all needs within reach.   Therapy Documentation Precautions:  Precautions Precautions: Fall, Back, Other (comment) Precaution Booklet Issued: No Precaution Comments: wound vac, pt able to recall back precautions Required Braces or Orthoses: Other Brace Other Brace: no brace needed per order, d/t wound vac Restrictions Weight Bearing Restrictions: No Pain: Pt reports 7/10 back pain primarily with movement; Myofascial release and repositioning  Therapy/Group: Individual Therapy  [name] 02/07/2021, 8:14 AM

## 2021-02-08 LAB — CBC WITH DIFFERENTIAL/PLATELET
Abs Immature Granulocytes: 0.04 10*3/uL (ref 0.00–0.07)
Basophils Absolute: 0 10*3/uL (ref 0.0–0.1)
Basophils Relative: 1 %
Eosinophils Absolute: 0.5 10*3/uL (ref 0.0–0.5)
Eosinophils Relative: 7 %
HCT: 23.5 % — ABNORMAL LOW (ref 39.0–52.0)
Hemoglobin: 7.7 g/dL — ABNORMAL LOW (ref 13.0–17.0)
Immature Granulocytes: 1 %
Lymphocytes Relative: 16 %
Lymphs Abs: 0.9 10*3/uL (ref 0.7–4.0)
MCH: 28.5 pg (ref 26.0–34.0)
MCHC: 32.8 g/dL (ref 30.0–36.0)
MCV: 87 fL (ref 80.0–100.0)
Monocytes Absolute: 0.6 10*3/uL (ref 0.1–1.0)
Monocytes Relative: 9 %
Neutro Abs: 4.1 10*3/uL (ref 1.7–7.7)
Neutrophils Relative %: 66 %
Platelets: 252 10*3/uL (ref 150–400)
RBC: 2.7 MIL/uL — ABNORMAL LOW (ref 4.22–5.81)
RDW: 14.8 % (ref 11.5–15.5)
WBC: 6.1 10*3/uL (ref 4.0–10.5)
nRBC: 0 % (ref 0.0–0.2)

## 2021-02-08 LAB — BASIC METABOLIC PANEL
Anion gap: 9 (ref 5–15)
BUN: 38 mg/dL — ABNORMAL HIGH (ref 6–20)
CO2: 26 mmol/L (ref 22–32)
Calcium: 8.5 mg/dL — ABNORMAL LOW (ref 8.9–10.3)
Chloride: 94 mmol/L — ABNORMAL LOW (ref 98–111)
Creatinine, Ser: 1.6 mg/dL — ABNORMAL HIGH (ref 0.61–1.24)
GFR, Estimated: 50 mL/min — ABNORMAL LOW (ref >60)
Glucose, Bld: 120 mg/dL — ABNORMAL HIGH (ref 70–99)
Potassium: 3.2 mmol/L — ABNORMAL LOW (ref 3.5–5.1)
Sodium: 129 mmol/L — ABNORMAL LOW (ref 135–145)

## 2021-02-08 LAB — SEDIMENTATION RATE: Sed Rate: 104 mm/hr — ABNORMAL HIGH (ref 0–16)

## 2021-02-08 LAB — C-REACTIVE PROTEIN: CRP: 19.5 mg/dL — ABNORMAL HIGH (ref <1.0)

## 2021-02-08 MED ORDER — SODIUM CHLORIDE 0.9 % IV SOLN: INTRAVENOUS | Status: DC

## 2021-02-08 MED ORDER — DIAZEPAM 5 MG PO TABS: 10.0000 mg | ORAL_TABLET | ORAL | Status: AC

## 2021-02-08 MED ORDER — POTASSIUM CHLORIDE CRYS ER 20 MEQ PO TBCR
40.0000 meq | EXTENDED_RELEASE_TABLET | Freq: Two times a day (BID) | ORAL | Status: AC
  Administered 2021-02-08 (×2): 40 meq via ORAL
  Filled 2021-02-08 (×2): qty 2

## 2021-02-08 NOTE — Consult Note (Signed)
[facility] Nurse wound follow up Patient receiving care in [facility] [id number] Wound type: Surgical lumbar wound Wound bed: beefy red granulation tissue with minimal yellow fibrinous tissue throughout the wound. Very friable. Bleeding more than usual today. Will add white foam prior to black foam Drainage (amount, consistency, odor) Serosanguinous  in the canister. Periwound: minor maceration around the edges of the wound with some blistering.  Dressing procedure/placement/frequency: 3 pieces of black foam removed. Used 53M skin barrier wipes on the surrounding skin. Placed nonaderent gauze around the wound where the skin is raw and blistering. Placed 1 piece of white foam in the wound and 1 piece black foam. 1 piece black foam used to bridge to the right of the wound. Immediate suction obtained at 125 mmHg. Patient tolerated the procedure well but needs to be premedicated. Sits on the side of the bed and leans on his walker while dressing is being changed.  More supplies ordered to be placed at the bedside.   [place] following M/W/F  Cathlean [place]se[place]. Tamala Julian, MSN, RN, [place], Lysle Pearl, [facility] Wound Treatment Associate Pager [phone]

## 2021-02-08 NOTE — Progress Notes (Signed)
Occupational Therapy Session Note  Patient Details  Name: [name] MRN: [id number] Date of Birth: 09-15-1965  Today's Date: 02/08/2021 OT Individual Time: 1115-1141 OT Individual Time Calculation (min): 26 min  Missed 19 mins d/t pain/ fatigue   Short Term Goals: Week 2:  OT Short Term Goal 1 (Week 2): STG=LTG 2/2 ELOS (continue working towards supervision/mod I LTG)  Skilled Therapeutic Interventions/Progress Updates:  Pt greeted seated in recliner  agreeable to OT intervention. Session focus on functional mobility as precursor to higher level BADLs. Pt completed sit<>stand from recliner with CGA with Rw. Pt reports increased pain 10/10 needing to return to sitting, pt requested muscle rub cream be applied to low back, provided light manual muscle massage to low back as pain mgmt strategy. Pt agreeable to household distance functional mobility within room with pt standing with CGA with Rw, CGA for ~ 20 ft of functional mobility with RW. Pt request to return to bed post ambulation. MIN A for sit>supine via log roll method needing assist to elevate BLEs back to bed. Pt left supine in bed with bed alarm activated and all needs within reach                 Therapy Documentation Precautions:  Precautions Precautions: Fall, Back, Other (comment) Precaution Booklet Issued: No Precaution Comments: wound vac, pt able to recall back precautions Required Braces or Orthoses: Other Brace Other Brace: no brace needed per order, d/t wound vac Restrictions Weight Bearing Restrictions: No      Therapy/Group: Individual Therapy  Corinne Ports [facility] 02/08/2021, 12:08 PM

## 2021-02-08 NOTE — Progress Notes (Signed)
[name] for Infectious Disease    Date of Admission:  01/31/2021   Total days of antibiotics 14/cefazolin        REASON FOR CONSULT: increasing inflammatory markers Attending: Lovorn  ID: [name] is a 55 y.o. male with klebsiella bacteremia secondary to lumbar fusion surgical site infectoin. Principal Problem:   Lumbar disc herniation with myelopathy Active Problems:   Hypoalbuminemia due to protein-calorie malnutrition (HCC)   Hyponatremia   Hypomagnesemia   Acute blood loss anemia   Postoperative pain   HPI:   55yo M who underwent lumbar posterior fusion on 9/30, remarkable for having 2L EBL requiring cell saver. On day of transfer to acute rehab started to have drenching nightsweats. He was started on oral doxy on 10/7 due to bogginess of incision. On 10/10 had fever, blood cx growing kleb bacteremia,  concern for surgical site infection. tThen on 10/14 underwent debridement and found to have deep wound infection with kleb pneumonia. He has been on cefazolin  2gm IV Q8hr tolerating without difficulty. No fever, chills, nightsweats. No leukocytosis. Had inflammatory markers done today which are trending updward for which dr lovorn was concerned for ongoing infection Subjective: afebrile  Medications:   vitamin C  500 mg Oral BID   Chlorhexidine Gluconate Cloth  6 each Topical Daily   citalopram  20 mg Oral Daily   diazepam  10 mg Oral On Call   diazepam  5 mg Oral QHS   enoxaparin (LOVENOX) injection  40 mg Subcutaneous Q24H   gabapentin  600 mg Oral TID    HYDROmorphone (DILAUDID) injection  1 mg Intravenous Q M,W,F   lactose free nutrition  237 mL Oral TID WC   losartan  100 mg Oral Daily   metFORMIN  1,000 mg Oral BID WC   morphine  15 mg Oral Q12H   Muscle Rub   Topical TID WC & HS   polyethylene glycol  17 g Oral Daily   potassium chloride  40 mEq Oral BID   rosuvastatin  20 mg Oral Daily   senna  2 tablet Oral BID   tamsulosin  0.8 mg Oral Daily    tiZANidine  4 mg Oral TID   zinc sulfate  220 mg Oral Daily    Objective: Vital signs in last 24 hours: Temp:  [97.4 F (36.3 C)-99 F (37.2 C)] 99 F (37.2 C) (10/28 1325) Pulse Rate:  [97-112] 112 (10/28 1325) Resp:  [18] 18 (10/28 1325) BP: (100-113)/(58-63) 113/63 (10/28 1325) SpO2:  [98 %-99 %] 99 % (10/28 1325) Weight:  [135.6 kg] 135.6 kg (10/27 1616) Physical Exam  Constitutional: He is oriented to person, place, and time. He appears well-developed and well-nourished. No distress.  HENT:  Mouth/Throat: Oropharynx is clear and moist. No oropharyngeal exudate.  Cardiovascular: Normal rate, regular rhythm and normal heart sounds. Exam reveals no gallop and no friction rub.  No murmur heard.  Pulmonary/Chest: Effort normal and breath sounds normal. No respiratory distress. He has no wheezes.  Abdominal: Soft. Bowel sounds are normal. He exhibits no distension. There is no tenderness.  Lymphadenopathy:  He has no cervical adenopathy.  Neurological: He is alert and oriented to person, place, and time.  Skin: Skin is warm and dry. No rash noted. No erythema.  Psychiatric: He has a normal mood and affect. His behavior is normal.    Lab Results Recent Labs    02/06/21 0417 02/08/21 0423  WBC  --  6.1  HGB  --  7.7*  HCT  --  23.5*  NA 129* 129*  K 3.9 3.2*  CL 95* 94*  CO2 26 26  BUN 29* 38*  CREATININE 1.21 1.60*   Liver Panel No results for input(s): PROT, ALBUMIN, AST, ALT, ALKPHOS, BILITOT, BILIDIR, IBILI in the last 72 hours. Sedimentation Rate Recent Labs    02/08/21 0423  ESRSEDRATE 104*   C-Reactive Protein Recent Labs    02/08/21 0423  CRP 19.5*    Microbiology: 10/16 blood cx ngtd Studies/Results: No results found.   Assessment/Plan: [place] deep wound/surgical site infection = continue with the plan to treat for 6 wk with IV abtx.  Wound care nurse did not comment on any worsening appearance during wound vac change per patient  - will  need to assess in person at next change  Elevated inflammatory markers = could consider repeat imaging but there will be some artifact given recent surgery. Markers are can be elevated in other settings. Luckily no other systemic symptoms nor elevated WBC. Continue to monitor  [facility] for Infectious Diseases Pager: [phone]  02/08/2021, 2:09 PM

## 2021-02-08 NOTE — Progress Notes (Signed)
[name] PHYSICAL MEDICINE & REHABILITATION PROGRESS NOTE  Subjective/Complaints:   Pt reports feeling a little better today- but notes his taste is off- things taste bland- no matter what he eats.   Feels like mood is doing a little better.  Per OTA- pt is brighter and more participatory.  Wound VAC ot be changed today- advised pt can have IV pain meds beforehand to help. Encouraged this since usually doesn't work with therapy after VAC changed.    ROS:   Pt denies SOB, abd pain, CP, N/V/C/D, and vision changes  Objective: Vital Signs: Blood pressure (!) 100/58, pulse (!) 103, temperature 97.8 F (36.6 C), temperature source Oral, resp. rate 18, height _0  (1.88 m), weight 135.6 kg, SpO2 98 %. No results found. Recent Labs    02/08/21 0423  WBC 6.1  HGB 7.7*  HCT 23.5*  PLT 252    Recent Labs    02/06/21 0417 02/08/21 0423  NA 129* 129*  K 3.9 3.2*  CL 95* 94*  CO2 26 26  GLUCOSE 164* 120*  BUN 29* 38*  CREATININE 1.21 1.60*  CALCIUM 8.6* 8.5*    Intake/Output Summary (Last 24 hours) at 02/08/2021 1226 Last data filed at 02/08/2021 0700 Gross per 24 hour  Intake 720 ml  Output 1200 ml  Net -480 ml        Physical Exam: BP (!) 100/58 (BP Location: Left Arm)   Pulse (!) 103   Temp 97.8 F (36.6 C) (Oral)   Resp 18   Ht _1  (1.88 m)   Wt 135.6 kg   SpO2 98%   BMI 38.38 kg/m         General: awake, alert, appropriate, sitting up in w/c at sink, doing bathing; OTA in room;  NAD HENT: conjugate gaze; oropharynx moist CV: regular rate; no JVD Pulmonary: CTA B/L; no W/R/R- good air movement GI: soft, NT, ND, (+)BS- protuberant vs distended- hypoactive Psychiatric: appropriate- brighter affect; less flat Neurological: Ox3  Skin: Warm and dry. VAC in place- suctioning working on back Musc: very TTP R low back -palpable trigger point Neuro: Alert Motor: Bilateral upper extremities: 5/5 proximal distal LE- HF 4/5; KE/KF 4+/5; Df/PF 4+/5   Sensation intact     Assessment/Plan: 1. Functional deficits which require 3+ hours per day of interdisciplinary therapy in a comprehensive inpatient rehab setting. Physiatrist is providing close team supervision and 24 hour management of active medical problems listed below. Physiatrist and rehab team continue to assess barriers to discharge/monitor patient progress toward functional and medical goals   Care Tool:  Bathing    Body parts bathed by patient: Right arm, Left arm, Chest, Abdomen, Front perineal area, Right upper leg, Left upper leg, Face   Body parts bathed by helper: Buttocks, Right lower leg, Left lower leg     Bathing assist Assist Level: Minimal Assistance - Patient > 75%     Upper Body Dressing/Undressing Upper body dressing   What is the patient wearing?: Hospital gown only    Upper body assist Assist Level: Minimal Assistance - Patient > 75%    Lower Body Dressing/Undressing Lower body dressing      What is the patient wearing?: Pants     Lower body assist Assist for lower body dressing: Supervision/Verbal cueing     Toileting Toileting    Toileting assist Assist for toileting: 2 Helpers     Transfers Chair/bed transfer  Transfers assist  Chair/bed transfer activity did not occur: Safety/medical concerns  Chair/bed transfer assist level: Contact Guard/Touching assist     Locomotion Ambulation   Ambulation assist   Ambulation activity did not occur: Safety/medical concerns  Assist level: Contact Guard/Touching assist Assistive device: Walker-rolling Max distance: 50'   Walk 10 feet activity   Assist  Walk 10 feet activity did not occur: Safety/medical concerns  Assist level: Contact Guard/Touching assist Assistive device: Walker-rolling   Walk 50 feet activity   Assist Walk 50 feet with 2 turns activity did not occur: Safety/medical concerns  Assist level: Contact Guard/Touching assist Assistive device:  Walker-rolling    Walk 150 feet activity   Assist Walk 150 feet activity did not occur: Safety/medical concerns         Walk 10 feet on uneven surface  activity   Assist Walk 10 feet on uneven surfaces activity did not occur: Safety/medical concerns         Wheelchair     Assist Is the patient using a wheelchair?: Yes   Wheelchair activity did not occur: Safety/medical concerns         Wheelchair 50 feet with 2 turns activity    Assist    Wheelchair 50 feet with 2 turns activity did not occur: Safety/medical concerns       Wheelchair 150 feet activity     Assist  Wheelchair 150 feet activity did not occur: Safety/medical concerns        Medical Problem List and Plan: 1.  Lumbar radiculopathy/myelopathy secondary to nerve root compression- s/p decompression and wash out after bacteremia from klebsiella pneumonia Con't PT and OT- participating better- need to go home with VAC and IV ABX 2.  Impaired mobility: continue Lovenox  10/27- if not walking enough, might need to go home on it             -antiplatelet therapy: N/A 3. Postoperative pain: Continue Oxycodone 15 mg prn. Resumed MSContin for more consistent pain relief. Scheduled Tizanidine TID. Resumed muscle rub to right thigh as it was helping before.              Changed IV dilaudid to prior to VAC changes.    10/25- will increase Flexeril to 10 mg TID prn; and increase MS contin to 30 mg BID- see how that works/monitor  10/26- more sleepy/out of it- willl decrease MS contin back to 15 mg BID  10/27- will add valium 5 mg nightly for muscle spasms  10/28- feels like pain a little better- reminded has IV Dilaudid for VAC changes- will take  Monitor with increased exertion 4. Mood: Team to provide ego support. LCSW to follow for evaluation and support.              -antipsychotic agents: N/A 5. Neuropsych: This patient is capable of making decisions on his own behalf. 6. Skin/Wound Care:   Monitor wound daily. Added vitamins and protein supplement to help promote wound healing.  7. Fluids/Electrolytes/Nutrition: Monitor I/Os 8. Klebsiella bacteremia w/diskitis: On Cefazolin 2 g every 8 hrs with end date 03/11/21 -- Check weekly CRP/ESR.   10/27- will order labs again in AM and if more elevated, will call ID 10/28- ESR up to 104 from 85 3 days ago and CRP up to 19.5 - called ID and they suggested MRI of lumbar spine- with contrast- is ordered with valium 10 mg x1 for test 9. HTN: Monitor BP tid--continue Microzide and cozaar daily.  10/24- d/c HCTZ due to low Na  Monitor with increased mobility 10. T2DM with  hyperglycemia: CM diet. On Metformin 1000 mg bid  10/26- BG's 140s-160s usually- con't regimen  Monitor with increased activity 11. Neurogenic bladder: with urinary retention On flomax. Increased to 0.8 mg nightly   10/24- still not voiding- will check U/A and Cx 10/26- no voiding- U/A (-) 13. Neuropathy: Continue Neurontin 600 mg TID.  14. ABLA: monitor for signs of bleeding. Hgb stabilizing to 8 range.  Hb 8.9 on 10/21  10/28- Hb down to 7.7- which might be lower, since kidney function is worse- will recheck in AM Cont to monitor 15. 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 -- Follow up with surgery after discharge.  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  17. Abdominal pain: Much better--dysesthesias?  18. Hypomagnesemia: Resumed Mag Ox.   Mag 1.6 on 10/21, labs ordered for Monday  10/24- Mg 2.1  10/28 -recheck in AM 19. Hyponatremia  Na+ 132 on 10/21, labs ordered for Monday  10/24- Na down to 128- will do fluid restriction 1500cc- and stop HCTZ  10/25- Na 127- will recheck in AM after off HCTZ and on fluid restriction  10/26- Na up to 129- con't fluid restriction and recheck Friday  10/27- labs ordered for AM  10/28- down to 129- on fluid restriction 20. Hypoalbuminemia  Supplement initiated on 10/21 21.  Azotemia/ARI  10/24- Cr up to 1.32- usually <1- BUN 29- will recheck in AM and if worse, will start more IVFs  10/25 Cr down to 1.13 and BUN stable at 29- will give IVFs 75 cc x 12 hours and recheck in AM  10/26- BUN still 29 and Cr 1.21- will recheck Friday- might need more IVFs at that time.  10/28- Cr up to 1.6 and BUN up to 38- will give 100 cc IVF NS x 24 hours and recheck in AM-  22 Constipation  10/24- will give Sorbitol 66 and increase bowel meds- increase Senokot to 2 tabs BID.   10/27- will give aorbitol again today after therapy.   102/8- still didn't go- if doesn't go today, will try lactulose tomorrow 23. Hypokalemia  10/28- will give KCL x2 40 mEq and recheck in AM- also checking Mg since can cause it to be low as well.    I spent a total of 49 minutes on total care- >50% coordination of care- spoke with ID, NSU and nursing- also talked to wife.    LOS: 8 days A FACE TO FACE EVALUATION WAS PERFORMED  [name] 02/08/2021, 12:26 PM

## 2021-02-08 NOTE — Progress Notes (Signed)
Occupational Therapy Session Note  Patient Details  Name: [name] MRN: [id number] Date of Birth: 01/20/1966  Today's Date: 02/08/2021 OT Individual Time: 9244-6286 OT Individual Time Calculation (min): 75 min    Short Term Goals: Week 2:  OT Short Term Goal 1 (Week 2): STG=LTG 2/2 ELOS (continue working towards supervision/mod I LTG)  Skilled Therapeutic Interventions/Progress Updates:    Pt resting in bed upon arrival. Pt with improved affect this morning and states he slept "ok" during the night. Pt agreeable to getting OOB and washing up at sink. Supine>sit EOB with supervision and HOB elevated. Pt donned pants with supervision using reacher and sit<>stand from EOB (elevated). Stand pivot tranfser to w/c and bathing completed at sink. Myofascial release for Rt lower back with some relief reported. Pt requires increased time for all transitional movements. Pt returned to bed to await WOC for wound vac change. Sit>supine with mod A for assisting with BLE onto bed. Pt able to reposition in bed without assistance. Additional myofascial release at pt's request. Pt remained in bed with all needs within reach and bed alarm activated.   Therapy Documentation Precautions:  Precautions Precautions: Fall, Back, Other (comment) Precaution Booklet Issued: No Precaution Comments: wound vac, pt able to recall back precautions Required Braces or Orthoses: Other Brace Other Brace: no brace needed per order, d/t wound vac Restrictions Weight Bearing Restrictions: No Pain: Pt reports 7/10 back pain with activity; requested meds from nursing, myofascial release, repositioned   Therapy/Group: Individual Therapy  [name] 02/08/2021, 8:10 AM

## 2021-02-08 NOTE — Progress Notes (Signed)
Physical Therapy Weekly Progress Note  Patient Details  Name: [name] MRN: [id number] Date of Birth: [birth year]  Beginning of progress report period: February 01, 2021 End of progress report period: February 08, 2021  Today's Date: 02/10/2021  Patient has met 3 of 3 short term goals.  Pt is making slow but steady gains during course of therapy. Pt continues to be significantly limited by pain but currently functional at overall CGA but very guarded. Pt is slowly increasing ambulation distances but ambulates with forward flexed posture and demonstrates bilateral knee flexion. Pt is also slowly increasing OOB tolerance with encouragement.   Patient continues to demonstrate the following deficits muscle weakness and muscle joint tightness, decreased cardiorespiratoy endurance, and decreased standing balance, decreased postural control, decreased balance strategies, difficulty maintaining precautions, and significant pain and muscle tightness in R lower back as well as at site of wound  and therefore will continue to benefit from skilled PT intervention to increase functional independence with mobility.  Patient progressing toward long term goals..  Continue plan of care.  PT Short Term Goals Week 1:  PT Short Term Goal 1 (Week 1): Pt will tolerate OOB activity x 30 min PT Short Term Goal 1 - Progress (Week 1): Met PT Short Term Goal 2 (Week 1): Pt will perform standing transfers with consistent CGA. PT Short Term Goal 2 - Progress (Week 1): Met PT Short Term Goal 3 (Week 1): Pt will ambulate at least 30 feet consistently with CGA and LRAD. PT Short Term Goal 3 - Progress (Week 1): Met Week 2:  PT Short Term Goal 1 (Week 2): =LTG due to ELOS  Skilled Therapeutic Interventions/Progress Updates:  Ambulation/gait training;Community reintegration;DME/adaptive equipment instruction;Neuromuscular re-education;Psychosocial support;Stair training;UE/LE Strength taining/ROM;Wheelchair  propulsion/positioning;UE/LE Coordination activities;Therapeutic Exercise;Patient/family education;Functional mobility training;Disease management/prevention;Cognitive remediation/compensation;Balance/vestibular training;Discharge planning;Functional electrical stimulation;Pain management;Skin care/wound management;Therapeutic Activities   Therapy Documentation Precautions:  Precautions Precautions: Fall, Back, Other (comment) Precaution Booklet Issued: No Precaution Comments: wound vac, pt able to recall back precautions Required Braces or Orthoses: Other Brace Other Brace: no brace needed per order, d/t wound vac Restrictions Weight Bearing Restrictions: No    Therapy/Group: Individual Therapy   Excell Seltzer, PT, DPT, CSRS 02/10/2021, 7:56 AM

## 2021-02-08 NOTE — Progress Notes (Signed)
Physical Therapy Session Note  Patient Details  Name: [name] MRN: [id number] Date of Birth: [birth year]  Today's Date: 02/08/2021 PT Individual Time: 1020-1045 PT Individual Time Calculation (min): 25 min   Short Term Goals: Week 1:  PT Short Term Goal 1 (Week 1): Pt will tolerate OOB activity x 30 min PT Short Term Goal 2 (Week 1): Pt will perform standing transfers with consistent CGA. PT Short Term Goal 3 (Week 1): Pt will ambulate at least 30 feet consistently with CGA and LRAD.  Skilled Therapeutic Interventions/Progress Updates: Pt presented in bed agreeable to therapy. Pt states pain 8/10, premedicated and contributes to recent change in wound vac. Pt performed supine to sidelying in preparation for sit and stopped due to increased ms spasm. PTA applied ms rub and performed STM to R paraspinals and noted trigger point areas with some relief per pt. Pt then completed transfer to sitting with supervision, use of bed features, and increased time. Pt then ambulated in room 9f with RW and CGA. Pt with intermittent brief rests. Pt required min cues for B knee extension as pt noted to ambulate with knee flexion and forward flexed posture. Pt agreeable to remain in recliner until next session. Pt left in recliner at end of session with call bell within reach and needs met.      Therapy Documentation Precautions:  Precautions Precautions: Fall, Back, Other (comment) Precaution Booklet Issued: No Precaution Comments: wound vac, pt able to recall back precautions Required Braces or Orthoses: Other Brace Other Brace: no brace needed per order, d/t wound vac Restrictions Weight Bearing Restrictions: No General:   Vital Signs: Therapy Vitals Pulse Rate: (!) 103 Resp: 18 Pain: Pain Assessment Pain Scale: 0-10 Pain Score: 10-Worst pain ever Pain Location: Back Pain Orientation: Posterior Pain Radiating Towards: Inner thigh and back ( L1-L5) Pain Descriptors / Indicators:  Throbbing Pain Onset: Other (Comment) (After wound vac change) Pain Intervention(s): Relaxation;Rest;Other (Comment) (medication prior to given the wound vac change) Mobility:   Locomotion :    Trunk/Postural Assessment :    Balance:   Exercises:   Other Treatments:      Therapy/Group: Individual Therapy  Arlie Posch 02/08/2021, 1:06 PM

## 2021-02-09 ENCOUNTER — Inpatient Hospital Stay (HOSPITAL_COMMUNITY): Payer: 59

## 2021-02-09 LAB — CBC WITH DIFFERENTIAL/PLATELET
Abs Immature Granulocytes: 0.04 10*3/uL (ref 0.00–0.07)
Basophils Absolute: 0 10*3/uL (ref 0.0–0.1)
Basophils Relative: 1 %
Eosinophils Absolute: 0.4 10*3/uL (ref 0.0–0.5)
Eosinophils Relative: 6 %
HCT: 22.7 % — ABNORMAL LOW (ref 39.0–52.0)
Hemoglobin: 7.5 g/dL — ABNORMAL LOW (ref 13.0–17.0)
Immature Granulocytes: 1 %
Lymphocytes Relative: 9 %
Lymphs Abs: 0.6 10*3/uL — ABNORMAL LOW (ref 0.7–4.0)
MCH: 28.8 pg (ref 26.0–34.0)
MCHC: 33 g/dL (ref 30.0–36.0)
MCV: 87.3 fL (ref 80.0–100.0)
Monocytes Absolute: 0.5 10*3/uL (ref 0.1–1.0)
Monocytes Relative: 9 %
Neutro Abs: 4.6 10*3/uL (ref 1.7–7.7)
Neutrophils Relative %: 74 %
Platelets: 268 10*3/uL (ref 150–400)
RBC: 2.6 MIL/uL — ABNORMAL LOW (ref 4.22–5.81)
RDW: 14.3 % (ref 11.5–15.5)
WBC: 6.1 10*3/uL (ref 4.0–10.5)
nRBC: 0 % (ref 0.0–0.2)

## 2021-02-09 LAB — BASIC METABOLIC PANEL
Anion gap: 7 (ref 5–15)
BUN: 32 mg/dL — ABNORMAL HIGH (ref 6–20)
CO2: 25 mmol/L (ref 22–32)
Calcium: 8.4 mg/dL — ABNORMAL LOW (ref 8.9–10.3)
Chloride: 99 mmol/L (ref 98–111)
Creatinine, Ser: 1.15 mg/dL (ref 0.61–1.24)
GFR, Estimated: >60 mL/min (ref >60)
Glucose, Bld: 133 mg/dL — ABNORMAL HIGH (ref 70–99)
Potassium: 3.9 mmol/L (ref 3.5–5.1)
Sodium: 131 mmol/L — ABNORMAL LOW (ref 135–145)

## 2021-02-09 LAB — MAGNESIUM: Magnesium: 2.1 mg/dL (ref 1.7–2.4)

## 2021-02-09 IMAGING — MR MR LUMBAR SPINE WO/W CM
4 of 7 series · 20 of 48 positions shown · IV contrast (gadavist)
Comparison: CT from [DATE].

CLINICAL DATA: Initial evaluation for bacteremia following back
surgery, abscess.

EXAM:
MRI LUMBAR SPINE WITHOUT AND WITH CONTRAST
TECHNIQUE: Multiplanar and multiecho pulse sequences of the lumbar spine were
obtained without and with intravenous contrast.
CONTRAST:  10mL GADAVIST GADOBUTROL 1 MMOL/ML IV SOLN

[Series 9: t2_tse_warp_sag · sagittal · 4.0mm · 0.81mm/px · 3 of 15 slices shown]
[im 1/15]
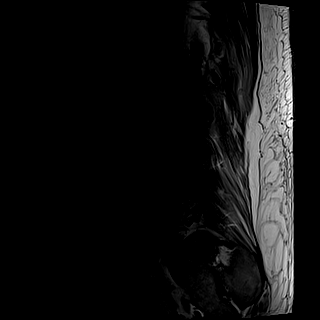
[im 8/15]
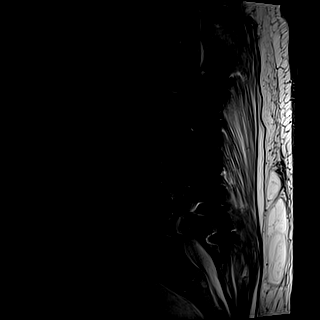
[im 15/15]
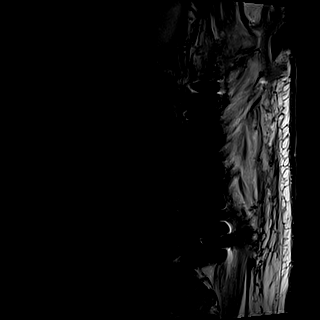

[Series 10: t2_tse_stir_warp_sag · sagittal · 4.0mm · 1.02mm/px · 3 of 17 slices shown]
[im 1/17]
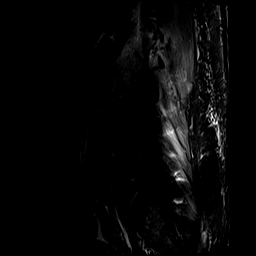
[im 11/17]
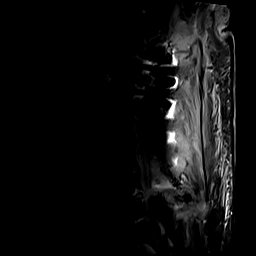
[im 17/17]
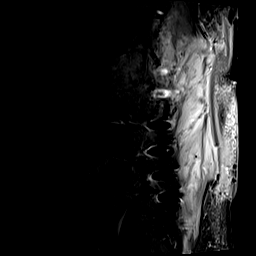

[Series 15: T1 fat-sat post-contrast · sagittal · 4.0mm · 0.88mm/px · 5 of 22 slices shown]
[im 1/22]
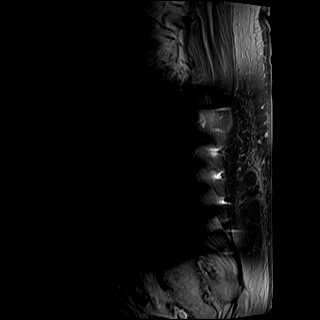
[im 6/22]
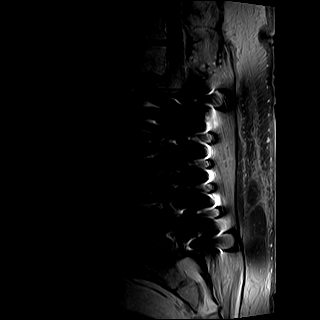
[im 11/22]
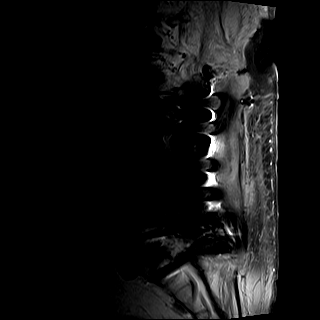
[im 16/22]
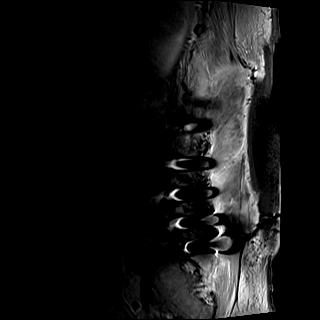
[im 22/22]
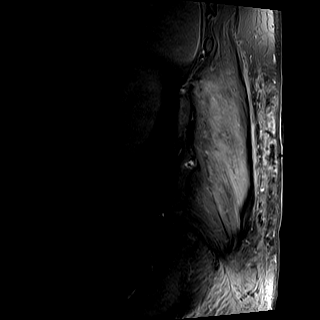

[Series 16: T1 post-contrast · axial · 4.0mm · 0.34mm/px · z∈[-216,-0]mm · 9 of 50 slices shown]
[im 1/50]
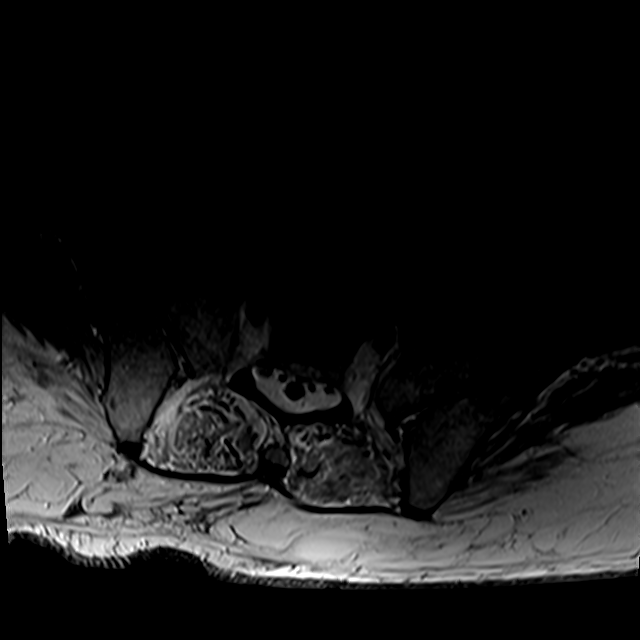
[im 9/50]
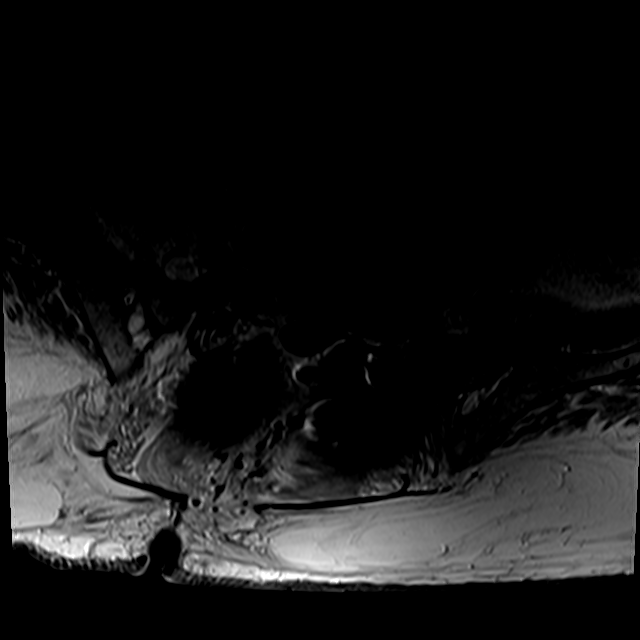
[im 14/50]
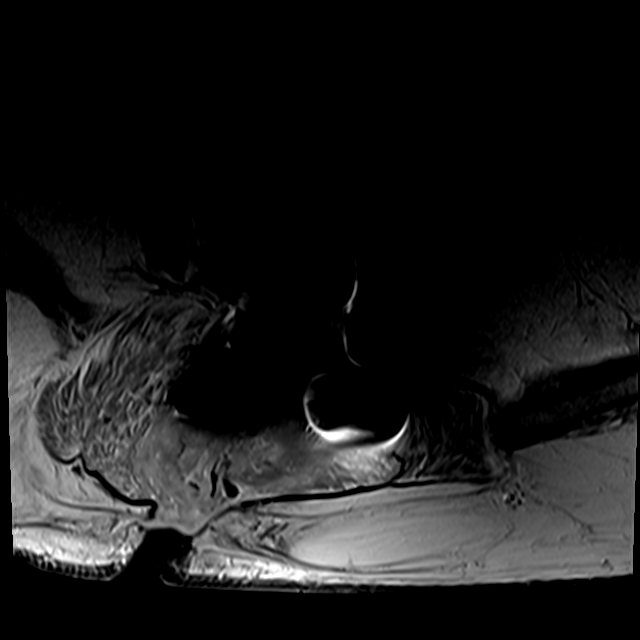
[im 23/50]
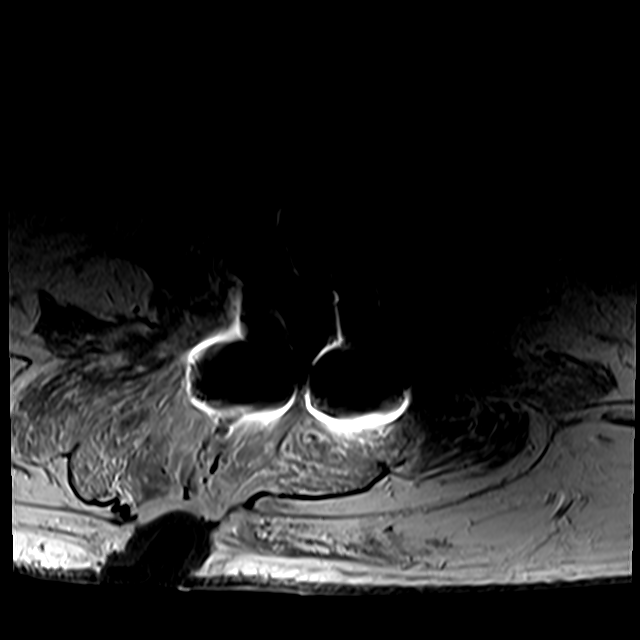
[im 27/50]
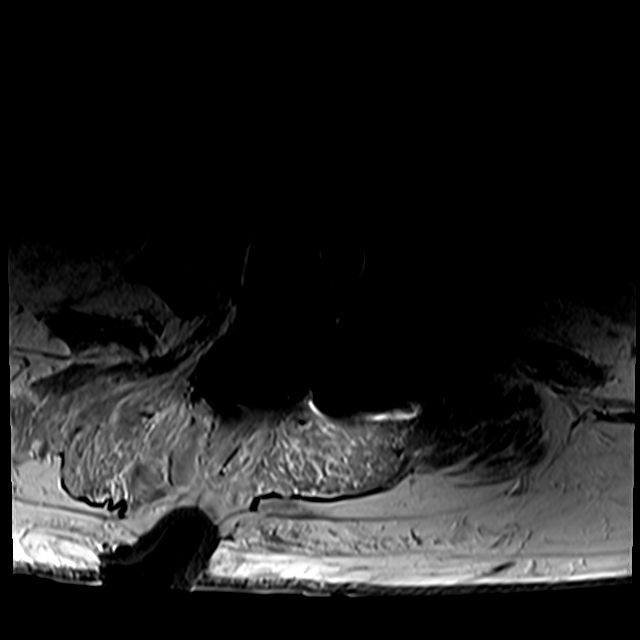
[im 36/50]
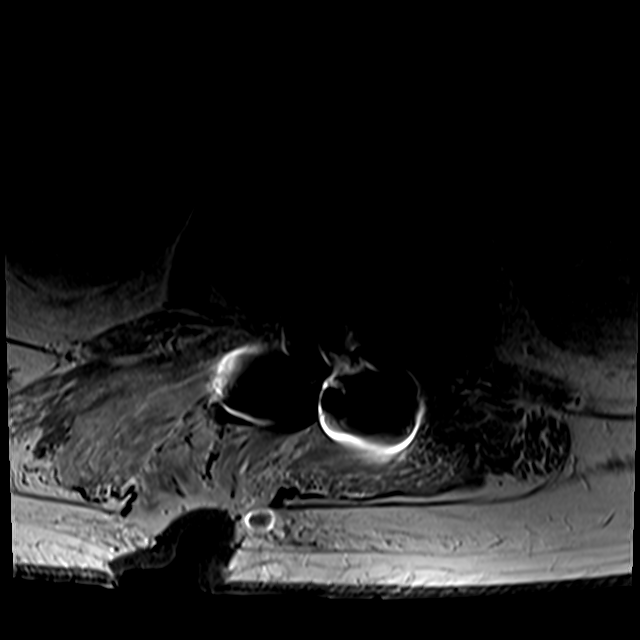
[im 41/50]
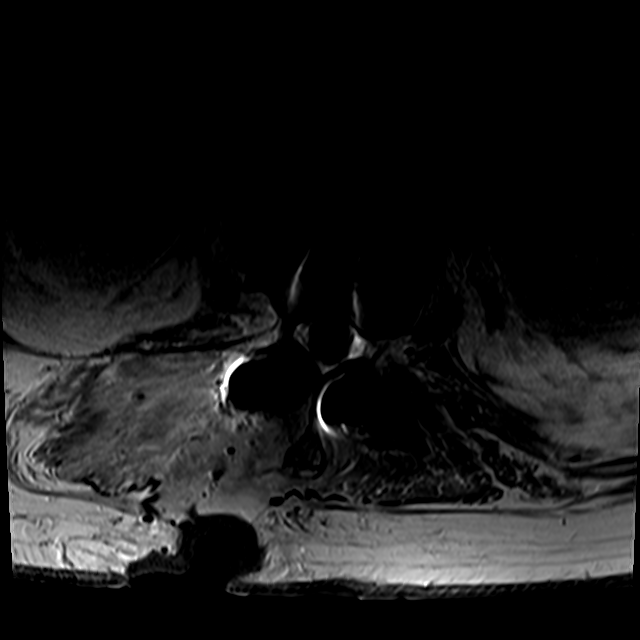
[im 45/50]
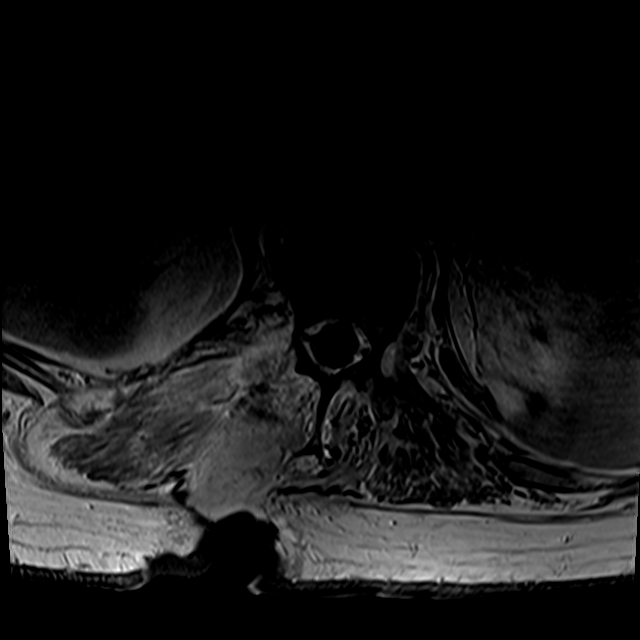
[im 50/50]
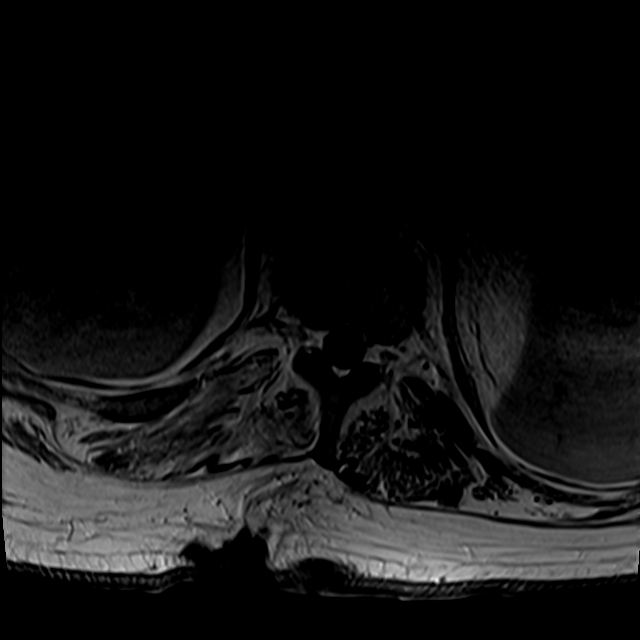

[20 of 48 positions shown; findings below may reference images not displayed]

FINDINGS: Segmentation: Standard. Lowest well-formed disc space labeled the
L5-S1 level.

Alignment: Sigmoid scoliotic curvature with straightening of the
normal lumbar lordosis. 2-3 mm retrolisthesis of L1 on L2.

Vertebrae: Extensive postoperative changes from prior PLIF at L1
through S1. Hardware better evaluated on prior CT. Abnormal edema
and enhancement seen about the L1-2 interspace, consistent with
osteomyelitis discitis. Associated paraspinous edema/phlegmon within
the adjacent left greater than right psoas musculature. Superimposed
approximate 1 cm soft tissue abscess within the left psoas muscle
(series 13, image 13). Irregular enhancing material within the left
ventral epidural space extending from L1-2 to approximately L2-3
suspicious for an associated epidural phlegmon and/or abscess,
difficult to visualize given streak artifact from adjacent hardware
(series 13, image 14) mild flattening of the adjacent left ventral
and lateral thecal sac without high-grade stenosis. No other
convincing epidural or subdural collections. No other loculated or
discrete soft tissue collections.

Remainder of the visualized bone marrow otherwise grossly within
normal limits. No evidence for acute or interval fracture. No
discrete or worrisome osseous lesions.

Conus medullaris and cauda equina: Conus extends to approximately
the L1 level, obscured by adjacent hardware. Conus and cauda equina
appear grossly normal.

Paraspinal and other soft tissues: Extensive postoperative changes
present throughout the posterior paraspinous soft tissues extending
from L1 through the sacrum. Diffuse stranding and enhancement
without discrete abscess or collection. Visualized visceral
structures unremarkable.

Disc levels:

T11-12: Seen only on sagittal projection. Disc desiccation with mild
disc bulge. Mild right-sided facet spurring. No stenosis.

T12-L1: Disc desiccation without significant disc bulge. No
stenosis.

L1-2: Prior PLIF. Findings concerning for osteomyelitis discitis as
above. Small amount of epidural phlegmon within the ventral left
epidural space posterior to the L2 vertebral body (series 13, image
14). Residual moderate spinal stenosis at this level. Left neural
foramen appears patent. Possible residual mild to moderate right
neural foraminal stenosis.

L2-3: Prior PLIF. Small amount of epidural phlegmon within the left
ventral epidural space from above. No significant spinal stenosis at
this level. Foramina appear grossly patent.

L3-4: Prior PLIF. No residual spinal stenosis. Residual mild to
moderate left neural foraminal narrowing. Right neural foramen
appears patent.

L4-5: Prior PLIF. No residual spinal stenosis. Residual mild left
greater than right L4 foraminal narrowing.

L5-S1: Prior PLIF. No residual spinal stenosis. Moderate right with
mild left L5 foraminal narrowing.
IMPRESSION: 1. Prior PLIF at L1 through S1.
2. Findings consistent with osteomyelitis discitis at L1-2 with
associated small amount of epidural phlegmon and/or abscess within
the left ventral epidural space extending from L1-2 to approximately
L2-3. Residual moderate spinal stenosis at the L1-2 level.
3. Associated paraspinous edema/phlegmon within the adjacent left
greater than right psoas musculature, with superimposed 1 cm left
psoas soft tissue abscess.
4. Extensive postoperative changes throughout the posterior
paraspinous soft tissues without discrete abscess or collection.

## 2021-02-09 MED ORDER — GADOBUTROL 1 MMOL/ML IV SOLN
10.0000 mL | Freq: Once | INTRAVENOUS | Status: AC | PRN
  Administered 2021-02-09: 10 mL via INTRAVENOUS

## 2021-02-09 MED ORDER — SORBITOL 70 % SOLN
60.0000 mL | Freq: Once | Status: DC
Start: 2021-02-09 — End: 2021-02-14
  Filled 2021-02-09: qty 60

## 2021-02-09 MED ORDER — SODIUM CHLORIDE 0.9 % IV SOLN: INTRAVENOUS | Status: AC

## 2021-02-09 MED ORDER — DIAZEPAM 5 MG PO TABS
10.0000 mg | ORAL_TABLET | ORAL | Status: AC
  Administered 2021-02-09: 10 mg via ORAL
  Filled 2021-02-09: qty 2

## 2021-02-09 NOTE — Progress Notes (Addendum)
Occupational Therapy Session Note  Patient Details  Name: [name] MRN: [id number] Date of Birth: 1965/09/20  Today's Date: 02/09/2021 OT Individual Time: 0700-0758 OT Individual Time Calculation (min): 58 min    Short Term Goals: Week 1:  OT Short Term Goal 1 (Week 1): Pt will don B shoes wiht MIN cuing for AE technique OT Short Term Goal 1 - Progress (Week 1): Progressing toward goal OT Short Term Goal 2 (Week 1): Pt will complete ambulatory toilet transfer with CGA consistently + LRAD. OT Short Term Goal 2 - Progress (Week 1): Met OT Short Term Goal 3 (Week 1): Pt will bathe wiht S OT Short Term Goal 3 - Progress (Week 1): Met  Skilled Therapeutic Interventions/Progress Updates:    Pt received supine in bed eating breakfast, agreeable to therapy. Session focus on self-care retraining, activity tolerance, functional transfers in prep for improved ADL/IADL/func mobility performance + decreased caregiver burden. Reports 7/10 back pain but reports being premedicated. Came to sitting EOB with S via log roll technique and increased time, reports pain increase in R lower back to 9/10. Required use of RW and heavy use of bed features. Per pt request, applied ms rub and performed STM to R paraspinals with minimal pain relief noted. Short ambulatory transfer > w/c with CGA and RW, cues for upright posture due to flexed trunk and heavy reliance of BUE support. Cues for PLB for pain management with mobility throughout. Completed seated grooming tasks with set-up A. Min A to assist with doffing/donning new hospital gown around [employer] picc line.   Pt reporting "about to burst" and need to be cathed. Declined attempt to sit on toilet due to increase in back pain from sitting up. Short ambulatory transfer back to bed same manner as before, required two attempts for Sit > stand due to low surface w/c height. Returned to supine with mod A to assist BLE back into bed. LPN notified pt requesting to be cathed.  Pt missed 15 min of OT due to nursing care.  Pt left semi-reclined in bed with bed alarm engaged, call bell in reach, and all immediate needs met.    Therapy Documentation Precautions:  Precautions Precautions: Fall, Back, Other (comment) Precaution Booklet Issued: No Precaution Comments: wound vac, pt able to recall back precautions Required Braces or Orthoses: Other Brace Other Brace: no brace needed per order, d/t wound vac Restrictions Weight Bearing Restrictions: No  Pain: 7/10 back pain ADL: See Care Tool for more details.   Therapy/Group: Individual Therapy  Volanda Napoleon MS, OTR/L  02/09/2021, 6:43 AM

## 2021-02-09 NOTE — Progress Notes (Signed)
Pt refused sorbitol and suppository for constipation relief

## 2021-02-09 NOTE — Progress Notes (Signed)
Physical Therapy Session Note  Patient Details  Name: [name] MRN: [id number] Date of Birth: 1966-03-02  Today's Date: 02/09/2021 PT Individual Time: 1100-1156 PT Individual Time Calculation (min): 56 min   Short Term Goals: Week 1:  PT Short Term Goal 1 (Week 1): Pt will tolerate OOB activity x 30 min PT Short Term Goal 1 - Progress (Week 1): Met PT Short Term Goal 2 (Week 1): Pt will perform standing transfers with consistent CGA. PT Short Term Goal 2 - Progress (Week 1): Met PT Short Term Goal 3 (Week 1): Pt will ambulate at least 30 feet consistently with CGA and LRAD. PT Short Term Goal 3 - Progress (Week 1): Met Week 2:     Skilled Therapeutic Interventions/Progress Updates:    pt received in bed and agreeable to therapy. Pt reported 7/10 pain on arrival, up to 9/10 with activity rest breaks and manual therapy provided throughout. Therapist provided trigger point release to several points in pt's R lumbar paraspinals, first in sidelying to decr pain before movement and then in sitting during rest breaks for pain relief. Bed mobility with supervision, pt able to direct care. Pt ambulated 3 x ~70 ft in room with RW and CGA, extended seated rest breaks throughout. Pt demoes flexed knee/hip/trunk posture with min improvement with VC. Therapist managed IV pole and wound vac. Pt returned to bed with min A repositioned in bed with supervision. Pt remained in bed and was left with all needs in reach and alarm active.   Therapy Documentation Precautions:  Precautions Precautions: Fall, Back, Other (comment) Precaution Booklet Issued: No Precaution Comments: wound vac, pt able to recall back precautions Required Braces or Orthoses: Other Brace Other Brace: no brace needed per order, d/t wound vac Restrictions Weight Bearing Restrictions: No    Therapy/Group: Individual Therapy  Mickel Fuchs 02/09/2021, 4:07 PM

## 2021-02-09 NOTE — Progress Notes (Signed)
[place] PHYSICAL MEDICINE & REHABILITATION PROGRESS NOTE  Subjective/Complaints:  Pt reports no too bad- dry mouth- asking why on IVF s and fluid restriction-explained Na is the issue.  Theracane doesn't get to where needs it, but Tom working on it- LBM 2 days ago- wants Sorbitol.  MRI pending.    ROS:   Pt denies SOB, abd pain, CP, N/V/C/D, and vision changes   Objective: Vital Signs: Blood pressure (!) 145/75, pulse (!) 108, temperature 98.5 F (36.9 C), resp. rate 16, height 6' 2" (1.88 m), weight 135.6 kg, SpO2 99 %. No results found. Recent Labs    02/08/21 0423 02/09/21 0525  WBC 6.1 6.1  HGB 7.7* 7.5*  HCT 23.5* 22.7*  PLT 252 268    Recent Labs    02/08/21 0423 02/09/21 0525  NA 129* 131*  K 3.2* 3.9  CL 94* 99  CO2 26 25  GLUCOSE 120* 133*  BUN 38* 32*  CREATININE 1.60* 1.15  CALCIUM 8.5* 8.4*    Intake/Output Summary (Last 24 hours) at 02/09/2021 1045 Last data filed at 02/09/2021 0936 Gross per 24 hour  Intake 2119 ml  Output 4800 ml  Net -2681 ml        Physical Exam: BP (!) 145/75 (BP Location: Left Arm)   Pulse (!) 108   Temp 98.5 F (36.9 C)   Resp 16   Ht 6' 2" (1.88 m)   Wt 135.6 kg   SpO2 99%   BMI 38.38 kg/m          General: awake, alert, appropriate, laying in bed; NAD HENT: conjugate gaze; oropharynx dry CV: regular rate; no JVD Pulmonary: CTA B/L; no W/R/R- good air movement GI: soft, NT, ND, (+)BS- protuberant; hypoactive Psychiatric: appropriate- less flat Neurological: Ox3   Skin: Warm and dry. VAC in place- suctioning working on back Musc: very TTP R low back -palpable trigger point Neuro: Alert Motor: Bilateral upper extremities: 5/5 proximal distal LE- HF 4/5; KE/KF 4+/5; Df/PF 4+/5  Sensation intact  Picture 10/26   Assessment/Plan: 1. Functional deficits which require 3+ hours per day of interdisciplinary therapy in a comprehensive inpatient rehab setting. Physiatrist is providing close  team supervision and 24 hour management of active medical problems listed below. Physiatrist and rehab team continue to assess barriers to discharge/monitor patient progress toward functional and medical goals   Care Tool:  Bathing    Body parts bathed by patient: Right arm, Left arm, Chest, Abdomen, Front perineal area, Right upper leg, Left upper leg, Face   Body parts bathed by helper: Buttocks, Right lower leg, Left lower leg     Bathing assist Assist Level: Minimal Assistance - Patient > 75%     Upper Body Dressing/Undressing Upper body dressing   What is the patient wearing?: Hospital gown only    Upper body assist Assist Level: Minimal Assistance - Patient > 75%    Lower Body Dressing/Undressing Lower body dressing      What is the patient wearing?: Pants     Lower body assist Assist for lower body dressing: Supervision/Verbal cueing     Toileting Toileting    Toileting assist Assist for toileting: 2 Helpers     Transfers Chair/bed transfer  Transfers assist  Chair/bed transfer activity did not occur: Safety/medical concerns  Chair/bed transfer assist level: Contact Guard/Touching assist     Locomotion Ambulation   Ambulation assist   Ambulation activity did not occur: Safety/medical concerns  Assist level: Contact Guard/Touching assist Assistive device:   Walker-rolling Max distance: 50'   Walk 10 feet activity   Assist  Walk 10 feet activity did not occur: Safety/medical concerns  Assist level: Contact Guard/Touching assist Assistive device: Walker-rolling   Walk 50 feet activity   Assist Walk 50 feet with 2 turns activity did not occur: Safety/medical concerns  Assist level: Contact Guard/Touching assist Assistive device: Walker-rolling    Walk 150 feet activity   Assist Walk 150 feet activity did not occur: Safety/medical concerns         Walk 10 feet on uneven surface  activity   Assist Walk 10 feet on uneven surfaces  activity did not occur: Safety/medical concerns         Wheelchair     Assist Is the patient using a wheelchair?: Yes   Wheelchair activity did not occur: Safety/medical concerns         Wheelchair 50 feet with 2 turns activity    Assist    Wheelchair 50 feet with 2 turns activity did not occur: Safety/medical concerns       Wheelchair 150 feet activity     Assist  Wheelchair 150 feet activity did not occur: Safety/medical concerns        Medical Problem List and Plan: 1.  Lumbar radiculopathy/myelopathy secondary to nerve root compression- s/p decompression and wash out after bacteremia from klebsiella pneumonia Con't PT and OT- participating better- need to go home with VAC and IV ABX  -con't PT and OT- 15/7-  2.  Impaired mobility: continue Lovenox  10/27- if not walking enough, might need to go home on it             -antiplatelet therapy: N/A 3. Postoperative pain: Continue Oxycodone 15 mg prn. Resumed MSContin for more consistent pain relief. Scheduled Tizanidine TID. Resumed muscle rub to right thigh as it was helping before.  10/27- will add valium 5 mg nightly for muscle spasms  10/28- feels like pain a little better- reminded has IV Dilaudid for VAC changes- will take  10/29- con't Valium nightly for muscle spasms as wlel as overall regimen  Monitor with increased exertion 4. Mood: Team to provide ego support. LCSW to follow for evaluation and support.              -antipsychotic agents: N/A 5. Neuropsych: This patient is capable of making decisions on his own behalf. 6. Skin/Wound Care:  Monitor wound daily. Added vitamins and protein supplement to help promote wound healing.  7. Fluids/Electrolytes/Nutrition: Monitor I/Os 8. Klebsiella bacteremia w/diskitis: On Cefazolin 2 g every 8 hrs with end date 03/11/21 -- Check weekly CRP/ESR.   10/27- will order labs again in AM and if more elevated, will call ID 10/28- ESR up to 104 from 85 3 days  ago and CRP up to 19.5 - called ID and they suggested MRI of lumbar spine- with contrast- is ordered with valium 10 mg x1 for test  10/29- CT looks OK- MRI pending- ID on board 9. HTN: Monitor BP tid--continue Microzide and cozaar daily.  10/24- d/c HCTZ due to low Na  Monitor with increased mobility 10. T2DM with hyperglycemia: CM diet. On Metformin 1000 mg bid  10/26- BG's 140s-160s usually- con't regimen  10/29- somehow CBGs aren't being done- reordered  Monitor with increased activity 11. Neurogenic bladder: with urinary retention On flomax. Increased to 0.8 mg nightly   10/24- still not voiding- will check U/A and Cx 10/26- no voiding- U/A (-) 13. Neuropathy: Continue Neurontin 600 mg TID.  14. ABLA: monitor for signs of bleeding. Hgb stabilizing to 8 range.  Hb 8.9 on 10/21  10/28- Hb down to 7.7- which might be lower, since kidney function is worse- will recheck in AM  10/29- Hb 7.5- likely from hemodilution and wound VAC- con't to recheck - MOnday Cont to monitor 15. 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 -- Follow up with surgery after discharge.  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  17. Abdominal pain: Much better--dysesthesias?  18. Hypomagnesemia: Resumed Mag Ox.   Mag 1.6 on 10/21, labs ordered for Monday  10/24- Mg 2.1  10/29- Mg 2.1- monitor 19. Hyponatremia  Na+ 132 on 10/21, labs ordered for Monday  10/24- Na down to 128- will do fluid restriction 1500cc- and stop HCTZ  10/25- Na 127- will recheck in AM after off HCTZ and on fluid restriction  10/26- Na up to 129- con't fluid restriction and recheck Friday  10/29- Na up to 131- recheck in AM 20. Hypoalbuminemia  Supplement initiated on 10/21 21. Azotemia/ARI  10/24- Cr up to 1.32- usually <1- BUN 29- will recheck in AM and if worse, will start more IVFs  10/25 Cr down to 1.13 and BUN stable at 29- will give IVFs 75 cc x 12 hours and recheck in AM  10/26- BUN still 29 and Cr  1.21- will recheck Friday- might need more IVFs at that time.  10/28- Cr up to 1.6 and BUN up to 38- will give 100 cc IVF NS x 24 hours and recheck in AM-   10/29- will give 12 more hours- then stop- and recheck in AM- Cr and BUN better 22 Constipation  10/24- will give Sorbitol 66 and increase bowel meds- increase Senokot to 2 tabs BID.   10/29- Sorbitol 60cc- if doesn't work, give lactulose 23. Hypokalemia  10/28- will give KCL x2 40 mEq and recheck in AM- also checking Mg since can cause it to be low as well. 10/29- up to 3.9-      LOS: 9 days A FACE TO FACE EVALUATION WAS PERFORMED  Temisha Murley 02/09/2021, 10:45 AM

## 2021-02-10 ENCOUNTER — Inpatient Hospital Stay (HOSPITAL_COMMUNITY): Payer: 59

## 2021-02-10 DIAGNOSIS — G062 Extradural and subdural abscess, unspecified: Secondary | ICD-10-CM

## 2021-02-10 DIAGNOSIS — R7881 Bacteremia: Secondary | ICD-10-CM

## 2021-02-10 DIAGNOSIS — B961 Klebsiella pneumoniae [K. pneumoniae] as the cause of diseases classified elsewhere: Secondary | ICD-10-CM

## 2021-02-10 DIAGNOSIS — M4646 Discitis, unspecified, lumbar region: Secondary | ICD-10-CM

## 2021-02-10 DIAGNOSIS — K6812 Psoas muscle abscess: Secondary | ICD-10-CM

## 2021-02-10 LAB — BASIC METABOLIC PANEL
Anion gap: 6 (ref 5–15)
BUN: 22 mg/dL — ABNORMAL HIGH (ref 6–20)
CO2: 25 mmol/L (ref 22–32)
Calcium: 8.6 mg/dL — ABNORMAL LOW (ref 8.9–10.3)
Chloride: 102 mmol/L (ref 98–111)
Creatinine, Ser: 0.91 mg/dL (ref 0.61–1.24)
GFR, Estimated: >60 mL/min (ref >60)
Glucose, Bld: 133 mg/dL — ABNORMAL HIGH (ref 70–99)
Potassium: 4.1 mmol/L (ref 3.5–5.1)
Sodium: 133 mmol/L — ABNORMAL LOW (ref 135–145)

## 2021-02-10 LAB — GLUCOSE, CAPILLARY
Glucose-Capillary: 148 mg/dL — ABNORMAL HIGH (ref 70–99)
Glucose-Capillary: 152 mg/dL — ABNORMAL HIGH (ref 70–99)
Glucose-Capillary: 131 mg/dL — ABNORMAL HIGH (ref 70–99)
Glucose-Capillary: 129 mg/dL — ABNORMAL HIGH (ref 70–99)

## 2021-02-10 IMAGING — DX DG ABDOMEN 1V
2 series · 2 of 2 positions shown · non-contrast
Comparison: [DATE]

CLINICAL DATA: Nausea, vomiting, abdominal pain

EXAM:
ABDOMEN - 1 VIEW

[abdomen kub (1 of 2)]
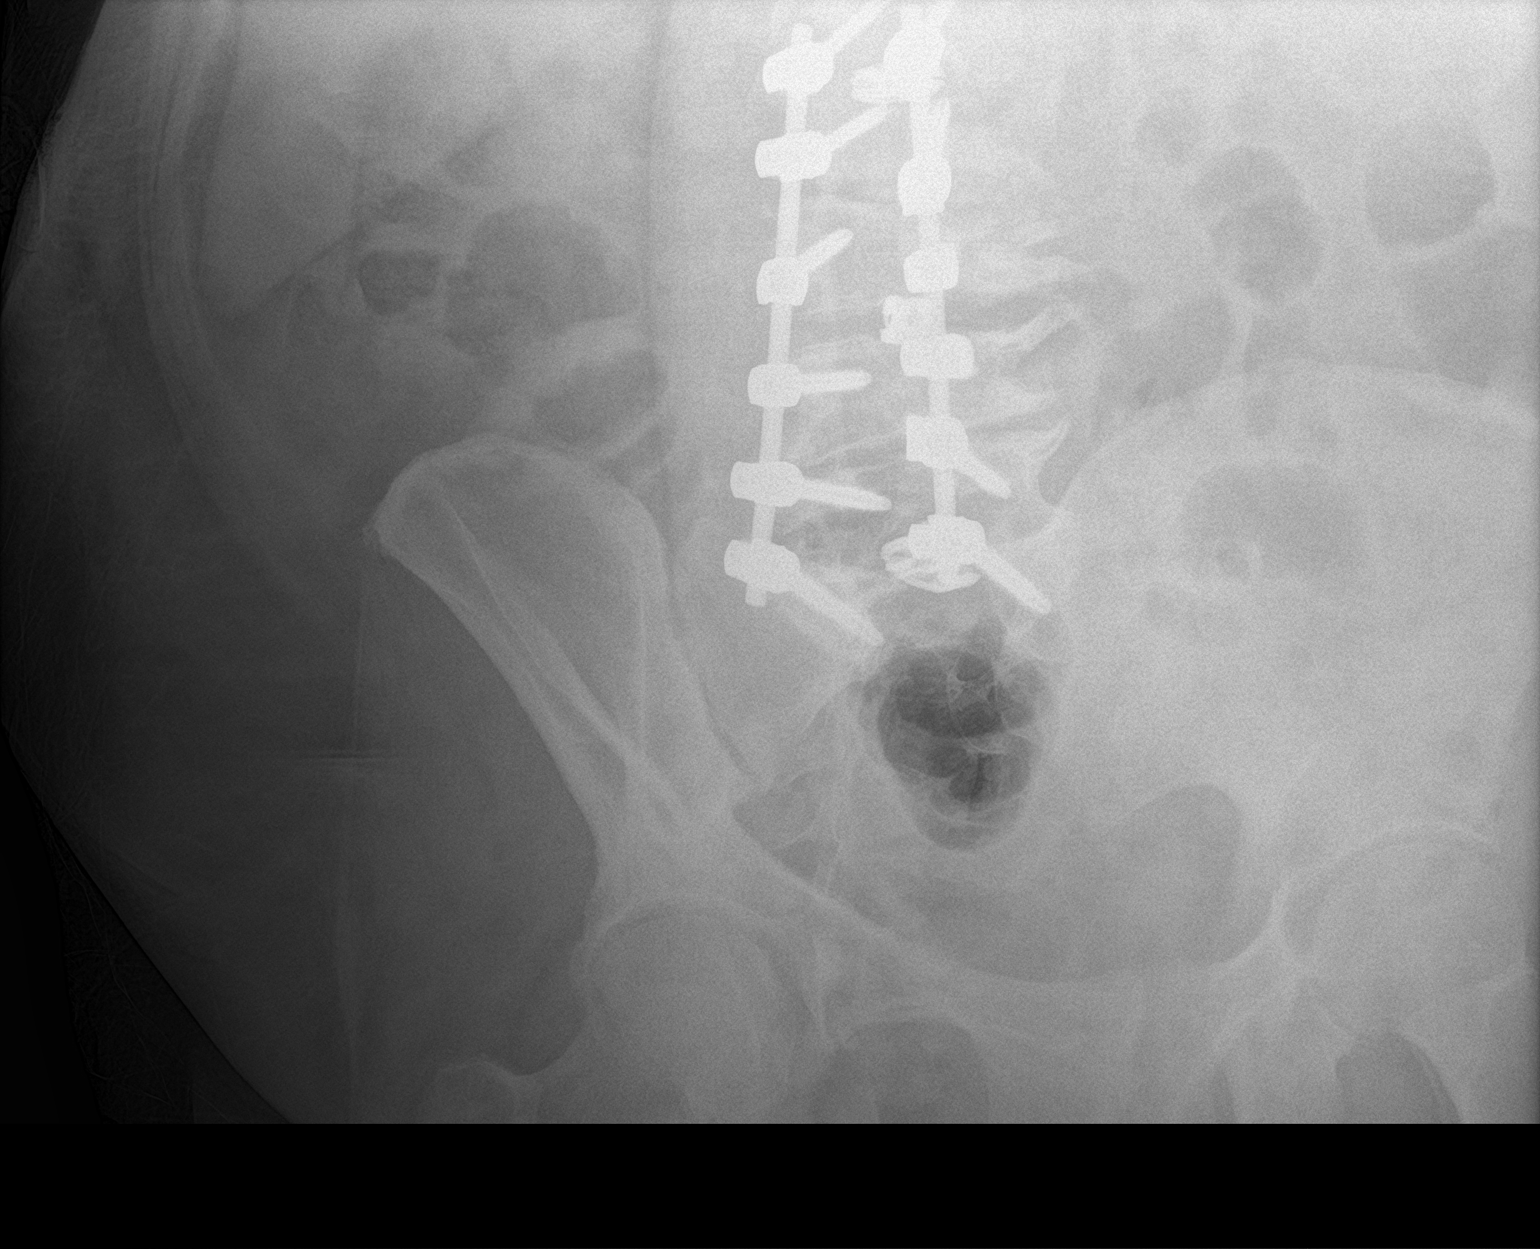

[abdomen kub (2 of 2)]
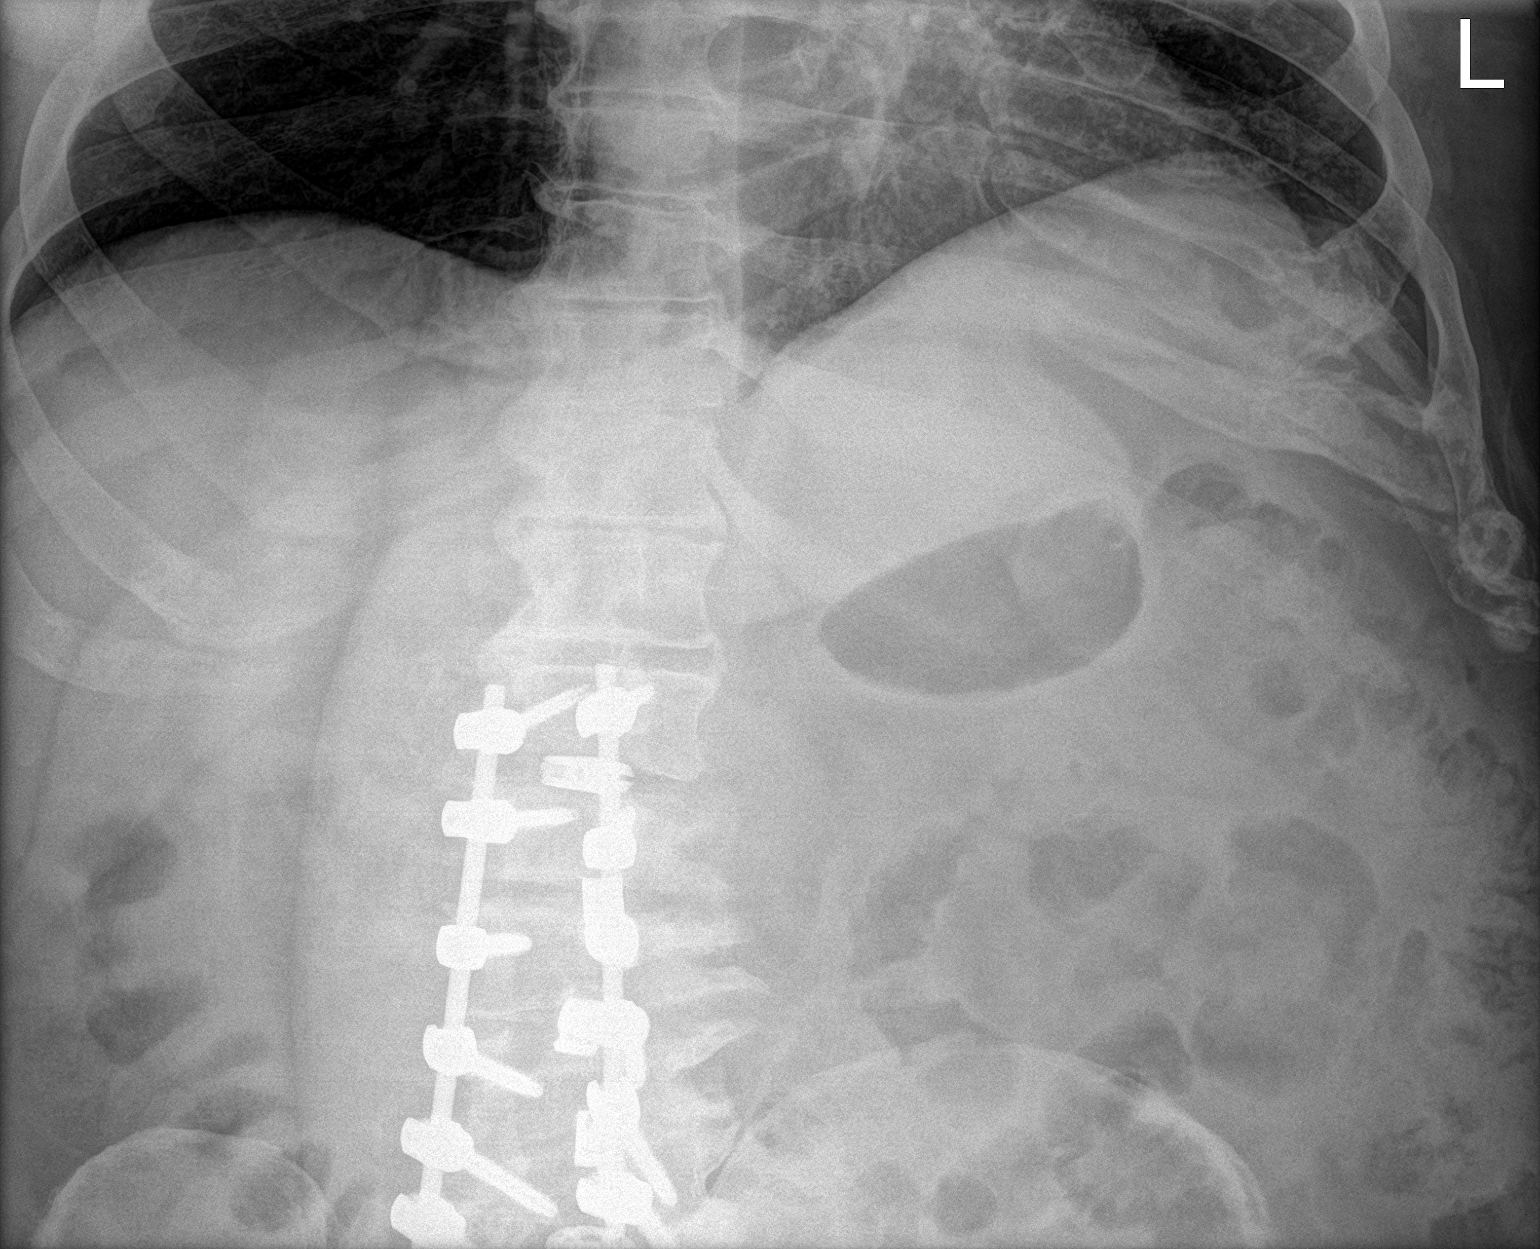

[2 of 2 positions shown; findings below may reference images not displayed]

FINDINGS: Examination is technically difficult due to patient's body habitus.
As far as seen, there is no dilation of small bowel loops. Gas and
stool are present in colon. Stomach is not distended. Laminectomy
and fusion is seen in lumbar spine. There is soft tissue density in
the right paraspinal region which may be an artifact or prominent
psoas. In previous MRI done on [DATE], right psoas was not
enlarged.
IMPRESSION: Nonspecific bowel gas pattern.

## 2021-02-10 NOTE — Progress Notes (Signed)
Subjective: Complaining of severe right-sided back pain   Antibiotics:  Anti-infectives (From admission, onward)    Start     Dose/Rate Route Frequency Ordered Stop   01/31/21 2100  ceFAZolin (ANCEF) IVPB 2g/100 mL premix        2 g 200 mL/hr over 30 Minutes Intravenous Every 8 hours 01/31/21 1616         Medications: Scheduled Meds:  vitamin C  500 mg Oral BID   Chlorhexidine Gluconate Cloth  6 each Topical Daily   citalopram  20 mg Oral Daily   diazepam  5 mg Oral QHS   enoxaparin (LOVENOX) injection  40 mg Subcutaneous Q24H   gabapentin  600 mg Oral TID    HYDROmorphone (DILAUDID) injection  1 mg Intravenous Q M,W,F   lactose free nutrition  237 mL Oral TID WC   losartan  100 mg Oral Daily   metFORMIN  1,000 mg Oral BID WC   morphine  15 mg Oral Q12H   Muscle Rub   Topical TID WC & HS   polyethylene glycol  17 g Oral Daily   rosuvastatin  20 mg Oral Daily   senna  2 tablet Oral BID   sorbitol  60 mL Oral Once   tamsulosin  0.8 mg Oral Daily   tiZANidine  4 mg Oral TID   zinc sulfate  220 mg Oral Daily   Continuous Infusions:  sodium chloride 10 mL/hr at 02/04/21 1431    ceFAZolin (ANCEF) IV 2 g (02/10/21 0515)   PRN Meds:.sodium chloride, acetaminophen, alum & mag hydroxide-simeth, bisacodyl, cyclobenzaprine, diphenhydrAMINE, guaiFENesin-dextromethorphan, lidocaine, menthol-cetylpyridinium **OR** phenol, oxyCODONE, prochlorperazine **OR** prochlorperazine **OR** prochlorperazine, sorbitol, traZODone    Objective: Weight change:   Intake/Output Summary (Last 24 hours) at 02/10/2021 1240 Last data filed at 02/10/2021 1000 Gross per 24 hour  Intake 600 ml  Output 3300 ml  Net -2700 ml   Blood pressure 111/71, pulse (!) 103, temperature 97.9 F (36.6 C), temperature source Oral, resp. rate 17, height 6\' 2"  (1.88 m), weight 135.6 kg, SpO2 98 %. Temp:  [97.9 F (36.6 C)-98.4 F (36.9 C)] 97.9 F (36.6 C) (10/30 0420) Pulse Rate:  [103-111] 103  (10/30 0420) Resp:  [16-17] 17 (10/30 0420) BP: (111-151)/(71-75) 111/71 (10/30 0420) SpO2:  [94 %-99 %] 98 % (10/30 0420)  Physical Exam: Physical Exam Constitutional:      Appearance: [name] is well-developed. [name] is obese.  HENT:     Head: Normocephalic and atraumatic.  Eyes:     Conjunctiva/sclera: Conjunctivae normal.  Cardiovascular:     Rate and Rhythm: Regular rhythm. Tachycardia present.  Pulmonary:     Effort: Pulmonary effort is normal. No respiratory distress.     Breath sounds: No wheezing.  Abdominal:     General: There is no distension.     Palpations: Abdomen is soft.  Musculoskeletal:        General: Normal range of motion.     Cervical back: Normal range of motion and neck supple.  Skin:    General: Skin is warm and dry.     Findings: No erythema or rash.  Neurological:     Mental Status: [name] is alert and oriented to person, place, and time.  Psychiatric:        Attention and Perception: Attention normal.        Mood and Affect: Mood is depressed.        Behavior: Behavior normal.  Thought Content: Thought content normal.        Cognition and Memory: Cognition and memory normal.        Judgment: Judgment normal.  Vacuum dressing in place  CBC:    BMET Recent Labs    02/09/21 0525 02/10/21 0345  NA 131* 133*  K 3.9 4.1  CL 99 102  CO2 25 25  GLUCOSE 133* 133*  BUN 32* 22*  CREATININE 1.15 0.91  CALCIUM 8.4* 8.6*     Liver Panel  No results for input(s): PROT, ALBUMIN, AST, ALT, ALKPHOS, BILITOT, BILIDIR, IBILI in the last 72 hours.     Sedimentation Rate Recent Labs    02/08/21 0423  ESRSEDRATE 104*   C-Reactive Protein Recent Labs    02/08/21 0423  CRP 19.5*    Micro Results: Recent Results (from the past 720 hour(s))  Urine Culture     Status: None   Collection Time: 01/21/21  4:06 PM   Specimen: Urine, Catheterized  Result Value Ref Range Status   Specimen Description URINE, CATHETERIZED  Final   Special Requests  Normal  Final   Culture   Final    NO GROWTH Performed at [facility], [address], [place], [place] [address]    Report Status 01/22/2021 FINAL  Final  Culture, blood (routine x 2)     Status: Abnormal   Collection Time: 01/21/21  4:22 PM   Specimen: BLOOD  Result Value Ref Range Status   Specimen Description BLOOD RIGHT ANTECUBITAL  Final   Special Requests   Final    BOTTLES DRAWN AEROBIC AND ANAEROBIC Blood Culture results may not be optimal due to an excessive volume of blood received in culture bottles   Culture  Setup Time   Final    GRAM NEGATIVE RODS IN BOTH AEROBIC AND ANAEROBIC BOTTLES CRITICAL RESULT CALLED TO, READ BACK BY AND VERIFIED WITH: PHARMD M PHAN [id number] AT 5 BY CM Performed at [facility], [place] [address] [place],  [address]    Culture KLEBSIELLA PNEUMONIAE (A)  Final   Report Status 01/24/2021 FINAL  Final   Organism ID, Bacteria KLEBSIELLA PNEUMONIAE  Final      Susceptibility   Klebsiella pneumoniae - MIC*    AMPICILLIN >=32 RESISTANT Resistant     CEFAZOLIN <=4 SENSITIVE Sensitive     CEFEPIME <=0.12 SENSITIVE Sensitive     CEFTAZIDIME <=1 SENSITIVE Sensitive     CEFTRIAXONE <=0.25 SENSITIVE Sensitive     CIPROFLOXACIN <=0.25 SENSITIVE Sensitive     GENTAMICIN <=1 SENSITIVE Sensitive     IMIPENEM <=0.25 SENSITIVE Sensitive     TRIMETH/SULFA <=20 SENSITIVE Sensitive     AMPICILLIN/SULBACTAM 4 SENSITIVE Sensitive     PIP/TAZO <=4 SENSITIVE Sensitive     * KLEBSIELLA PNEUMONIAE  Blood Culture ID Panel (Reflexed)     Status: Abnormal   Collection Time: 01/21/21  4:22 PM  Result Value Ref Range Status   Enterococcus faecalis NOT DETECTED NOT DETECTED Final   Enterococcus Faecium NOT DETECTED NOT DETECTED Final   Listeria monocytogenes NOT DETECTED NOT DETECTED Final   Staphylococcus species NOT DETECTED NOT DETECTED Final   Staphylococcus aureus (BCID) NOT DETECTED NOT DETECTED Final   Staphylococcus epidermidis NOT DETECTED  NOT DETECTED Final   Staphylococcus lugdunensis NOT DETECTED NOT DETECTED Final   Streptococcus species NOT DETECTED NOT DETECTED Final   Streptococcus agalactiae NOT DETECTED NOT DETECTED Final   Streptococcus pneumoniae NOT DETECTED NOT DETECTED Final  Streptococcus pyogenes NOT DETECTED NOT DETECTED Final   A.calcoaceticus-baumannii NOT DETECTED NOT DETECTED Final   Bacteroides fragilis NOT DETECTED NOT DETECTED Final   Enterobacterales DETECTED (A) NOT DETECTED Final    Comment: CRITICAL RESULT CALLED TO, READ BACK BY AND VERIFIED WITH: PHARMD M PHAM [id number] AT 1258 BY CM    Enterobacter cloacae complex NOT DETECTED NOT DETECTED Final   Escherichia coli NOT DETECTED NOT DETECTED Final   Klebsiella aerogenes NOT DETECTED NOT DETECTED Final   Klebsiella oxytoca NOT DETECTED NOT DETECTED Final   Klebsiella pneumoniae DETECTED (A) NOT DETECTED Final    Comment: CRITICAL RESULT CALLED TO, READ BACK BY AND VERIFIED WITH: PHARMD M PHAN [id number] AT 12580 BY CM    Proteus species NOT DETECTED NOT DETECTED Final   Salmonella species NOT DETECTED NOT DETECTED Final   Serratia marcescens NOT DETECTED NOT DETECTED Final   Haemophilus influenzae NOT DETECTED NOT DETECTED Final   Neisseria meningitidis NOT DETECTED NOT DETECTED Final   Pseudomonas aeruginosa NOT DETECTED NOT DETECTED Final   Stenotrophomonas maltophilia NOT DETECTED NOT DETECTED Final   Candida albicans NOT DETECTED NOT DETECTED Final   Candida auris NOT DETECTED NOT DETECTED Final   Candida glabrata NOT DETECTED NOT DETECTED Final   Candida krusei NOT DETECTED NOT DETECTED Final   Candida parapsilosis NOT DETECTED NOT DETECTED Final   Candida tropicalis NOT DETECTED NOT DETECTED Final   Cryptococcus neoformans/gattii NOT DETECTED NOT DETECTED Final   CTX-M ESBL NOT DETECTED NOT DETECTED Final   Carbapenem resistance IMP NOT DETECTED NOT DETECTED Final   Carbapenem resistance KPC NOT DETECTED NOT DETECTED Final   Carbapenem  resistance NDM NOT DETECTED NOT DETECTED Final   Carbapenem resist OXA 48 LIKE NOT DETECTED NOT DETECTED Final   Carbapenem resistance VIM NOT DETECTED NOT DETECTED Final    Comment: Performed at [facility], [address] [place], [place] [address]  Culture, blood (routine x 2)     Status: Abnormal   Collection Time: 01/21/21  4:23 PM   Specimen: BLOOD  Result Value Ref Range Status   Specimen Description BLOOD RIGHT ANTECUBITAL  Final   Special Requests   Final    BOTTLES DRAWN AEROBIC AND ANAEROBIC Blood Culture results may not be optimal due to an excessive volume of blood received in culture bottles   Culture  Setup Time   Final    GRAM NEGATIVE RODS IN BOTH AEROBIC AND ANAEROBIC BOTTLES    Culture (A)  Final    KLEBSIELLA PNEUMONIAE SUSCEPTIBILITIES PERFORMED ON PREVIOUS CULTURE WITHIN THE LAST 5 DAYS. Performed at [facility], [place] [address], [place], [place] [address]    Report Status 01/24/2021 FINAL  Final  Culture, blood (routine x 2)     Status: None   Collection Time: 01/23/21 11:36 AM   Specimen: BLOOD  Result Value Ref Range Status   Specimen Description BLOOD RIGHT ANTECUBITAL  Final   Special Requests IN PEDIATRIC BOTTLE Blood Culture adequate volume  Final   Culture   Final    NO GROWTH 5 DAYS Performed at [facility], [place] [address], [place], [place] [address]    Report Status 01/28/2021 FINAL  Final  Culture, blood (routine x 2)     Status: None   Collection Time: 01/23/21 11:45 AM   Specimen: BLOOD  Result Value Ref Range Status   Specimen Description BLOOD LEFT ANTECUBITAL  Final   Special Requests IN PEDIATRIC BOTTLE Blood Culture adequate  volume  Final   Culture   Final    NO GROWTH 5 DAYS Performed at [facility], [place] [address], [place], [place] [address]    Report Status 01/28/2021 FINAL  Final  SARS Coronavirus 2 by RT PCR (hospital order, performed in [facility] hospital lab) Nasopharyngeal Nasopharyngeal Swab      Status: None   Collection Time: 01/25/21  9:52 AM   Specimen: Nasopharyngeal Swab  Result Value Ref Range Status   SARS Coronavirus 2 NEGATIVE NEGATIVE Final    Comment: (NOTE) SARS-CoV-2 target nucleic acids are NOT DETECTED.  The SARS-CoV-2 RNA is generally detectable in upper and lower respiratory specimens during the acute phase of infection. The lowest concentration of SARS-CoV-2 viral copies this assay can detect is 250 copies / mL. A negative result does not preclude SARS-CoV-2 infection and should not be used as the sole basis for treatment or other patient management decisions.  A negative result may occur with improper specimen collection / handling, submission of specimen other than nasopharyngeal swab, presence of viral mutation(s) within the areas targeted by this assay, and inadequate number of viral copies (<250 copies / mL). A negative result must be combined with clinical observations, patient history, and epidemiological information.  Fact Sheet for Patients:   StrictlyIdeas.no  Fact Sheet for Healthcare Providers: BankingDealers.co.za  This test is not yet approved or  cleared by the [place] FDA and has been authorized for detection and/or diagnosis of SARS-CoV-2 by FDA under an Emergency Use Authorization (EUA).  This EUA will remain in effect (meaning this test can be used) for the duration of the COVID-19 declaration under Section 564(b)(1) of the Act, 21 U.S.C. section 360bbb-3(b)(1), unless the authorization is terminated or revoked sooner.  Performed at [facility], [name] [address], [place], [place] [address]   Surgical pcr screen     Status: Abnormal   Collection Time: 01/25/21  9:52 AM   Specimen: Nasopharyngeal Swab; Nasal Swab  Result Value Ref Range Status   MRSA, PCR POSITIVE (A) NEGATIVE Final    Comment: RESULT CALLED TO, READ BACK BY AND VERIFIED WITH: RN R PUGH [id number] FCP     Staphylococcus aureus POSITIVE (A) NEGATIVE Final    Comment: (NOTE) The Xpert SA Assay (FDA approved for NASAL specimens in patients 40 years of age and older), is one component of a comprehensive surveillance program. It is not intended to diagnose infection nor to guide or monitor treatment. Performed at [facility], [place] [address], [place], [place] [address]   Aerobic/Anaerobic Culture w Gram Stain (surgical/deep wound)     Status: None   Collection Time: 01/25/21  4:19 PM   Specimen: Wound  Result Value Ref Range Status   Specimen Description WOUND  Final   Special Requests  LUMBAR WOUND  Final   Gram Stain   Final    ABUNDANT WBC PRESENT, PREDOMINANTLY PMN NO ORGANISMS SEEN    Culture   Final    RARE KLEBSIELLA PNEUMONIAE NO ANAEROBES ISOLATED Performed at [facility], [place] [address], [place], [place] [address]    Report Status 01/31/2021 FINAL  Final   Organism ID, Bacteria KLEBSIELLA PNEUMONIAE  Final      Susceptibility   Klebsiella pneumoniae - MIC*    AMPICILLIN >=32 RESISTANT Resistant     CEFAZOLIN <=4 SENSITIVE Sensitive     CEFEPIME <=0.12 SENSITIVE Sensitive     CEFTAZIDIME <=1 SENSITIVE Sensitive  CEFTRIAXONE <=0.25 SENSITIVE Sensitive     CIPROFLOXACIN <=0.25 SENSITIVE Sensitive     GENTAMICIN <=1 SENSITIVE Sensitive     IMIPENEM <=0.25 SENSITIVE Sensitive     TRIMETH/SULFA <=20 SENSITIVE Sensitive     AMPICILLIN/SULBACTAM 8 SENSITIVE Sensitive     PIP/TAZO <=4 SENSITIVE Sensitive     * RARE KLEBSIELLA PNEUMONIAE  Culture, blood (routine x 2)     Status: None   Collection Time: 01/27/21  9:54 AM   Specimen: BLOOD  Result Value Ref Range Status   Specimen Description BLOOD RIGHT ANTECUBITAL  Final   Special Requests   Final    BOTTLES DRAWN AEROBIC AND ANAEROBIC Blood Culture adequate volume   Culture   Final    NO GROWTH 5 DAYS Performed at [facility], [address], [place], [place] [address]    Report Status 02/01/2021  FINAL  Final  Culture, blood (routine x 2)     Status: None   Collection Time: 01/27/21  9:59 AM   Specimen: BLOOD  Result Value Ref Range Status   Specimen Description BLOOD LEFT ANTECUBITAL  Final   Special Requests   Final    BOTTLES DRAWN AEROBIC AND ANAEROBIC Blood Culture adequate volume   Culture   Final    NO GROWTH 5 DAYS Performed at [facility], [place] [address] [place], [place], [place] [address]    Report Status 02/01/2021 FINAL  Final  Urine Culture     Status: None   Collection Time: 02/04/21  8:48 PM   Specimen: Urine, Catheterized  Result Value Ref Range Status   Specimen Description URINE, CATHETERIZED  Final   Special Requests NONE  Final   Culture   Final    NO GROWTH Performed at [facility], [place] [address] [place], [place] [address]    Report Status 02/05/2021 FINAL  Final    Studies/Results: MR Lumbar Spine W Wo Contrast  Result Date: 02/09/2021 CLINICAL DATA:  Initial evaluation for bacteremia following back surgery, abscess. EXAM: MRI LUMBAR SPINE WITHOUT AND WITH CONTRAST TECHNIQUE: Multiplanar and multiecho pulse sequences of the lumbar spine were obtained without and with intravenous contrast. CONTRAST:  36mL GADAVIST GADOBUTROL 1 MMOL/ML IV SOLN COMPARISON:  CT from 01/24/2021. FINDINGS: Segmentation: Standard. Lowest well-formed disc space labeled the L5-S1 level. Alignment: Sigmoid scoliotic curvature with straightening of the normal lumbar lordosis. 2-3 mm retrolisthesis of L1 on L2. Vertebrae: Extensive postoperative changes from prior PLIF at L1 through S1. Hardware better evaluated on prior CT. Abnormal edema and enhancement seen about the L1-2 interspace, consistent with osteomyelitis discitis. Associated paraspinous edema/phlegmon within the adjacent left greater than right psoas musculature. Superimposed approximate 1 cm soft tissue abscess within the left psoas muscle (series 13, image 13). Irregular enhancing material within the left ventral  epidural space extending from L1-2 to approximately L2-3 suspicious for an associated epidural phlegmon and/or abscess, difficult to visualize given streak artifact from adjacent hardware (series 13, image 14) mild flattening of the adjacent left ventral and lateral thecal sac without high-grade stenosis. No other convincing epidural or subdural collections. No other loculated or discrete soft tissue collections. Remainder of the visualized bone marrow otherwise grossly within normal limits. No evidence for acute or interval fracture. No discrete or worrisome osseous lesions. Conus medullaris and cauda equina: Conus extends to approximately the L1 level, obscured by adjacent hardware. Conus and cauda equina appear grossly normal. Paraspinal and other soft tissues: Extensive postoperative changes present throughout the posterior paraspinous  soft tissues extending from L1 through the sacrum. Diffuse stranding and enhancement without discrete abscess or collection. Visualized visceral structures unremarkable. Disc levels: T11-12: Seen only on sagittal projection. Disc desiccation with mild disc bulge. Mild right-sided facet spurring. No stenosis. T12-L1: Disc desiccation without significant disc bulge. No stenosis. L1-2: Prior PLIF. Findings concerning for osteomyelitis discitis as above. Small amount of epidural phlegmon within the ventral left epidural space posterior to the L2 vertebral body (series 13, image 14). Residual moderate spinal stenosis at this level. Left neural foramen appears patent. Possible residual mild to moderate right neural foraminal stenosis. L2-3: Prior PLIF. Small amount of epidural phlegmon within the left ventral epidural space from above. No significant spinal stenosis at this level. Foramina appear grossly patent. L3-4: Prior PLIF. No residual spinal stenosis. Residual mild to moderate left neural foraminal narrowing. Right neural foramen appears patent. L4-5: Prior PLIF. No residual  spinal stenosis. Residual mild left greater than right L4 foraminal narrowing. L5-S1: Prior PLIF. No residual spinal stenosis. Moderate right with mild left L5 foraminal narrowing. IMPRESSION: 1. Prior PLIF at L1 through S1. 2. 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 Residual moderate spinal stenosis at the L1-2 level. 3. Associated paraspinous edema/phlegmon within the adjacent left greater than right psoas musculature, with superimposed 1 cm left psoas soft tissue abscess. 4. Extensive postoperative changes throughout the posterior paraspinous soft tissues without discrete abscess or collection. Electronically Signed   By: Jeannine Boga M.D.   On: 02/09/2021 20:30      Assessment/Plan:  INTERVAL HISTORY:  MRI showed steomyelitis and discitis at L1-L2 with an epidural phlegmon/abscess in the left ventral epidural space from L1-L2 with residual moderate spinal stenosis and paraspinous edema phlegmon in the left greater than right psoas muscles as well as a 1 cm left psoas soft tissue abscess   Principal Problem:   Lumbar disc herniation with myelopathy Active Problems:   Hypoalbuminemia due to protein-calorie malnutrition (HCC)   Hyponatremia   Hypomagnesemia   Acute blood loss anemia   Postoperative pain    [name] is a 55 y.o. 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  MRI has been performed which shows evidence of discitis and vertebral osteomyelitis from L1-L2 with an epidural phlegmon/abscess from L1-L2 in the ventral epidural space as well as edema and phlegmonous changes in the left greater than right psoas muscles  along with a 1 cm psoas muscle abscess.  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  I will continue his cefazolin for the present.  I spent 37 minutes with the patient including face to face counseling of the patient guarding his deep infection bacteremia personally reviewing MRI of spine as well as sed rate CRP CBC BMP, along with  review of medical records before and during the visit and in coordination of his care with Dr. Dagoberto Ligas.   LOS: 10 days   Alcide Evener 02/10/2021, 12:40 PM

## 2021-02-10 NOTE — Progress Notes (Signed)
I reviewed his MRI, I reviewed the pertinent labs, and I interviewed and examined the patient.  He only complains of right sided low back pain in the muscle just above the right hip.  He denies left-sided pain.  He denies leg pain.  He states "walking is not a problem, it is just the pain on the right side."  He has a wound VAC in place.  Infectious disease has seen the patient.  He is on Ancef per infectious disease.  The VAC looks good.  There is a large open wound with VAC in place.  He moves his legs well to an in bed exam.  He looks nontoxic and looks to be in no significant pain.  He moves in the bed fairly well.  MRI is difficult to interpret because of all of the hardware and the artifact in the previous surgery and the scar tissue, but I do think there is a 1 cm abscess in the left psoas musculature adjacent to the L2-3 disc space and I think there may be some epidural phlegmon though it is difficult to distinguish this from epidural scar from placement of the cage from the left side.  I do not see high-grade canal stenosis.  I do think there may be evidence of discitis at L1-2, we also see this type of change in the disc space after some interbody fusions.  Certainly his inflammatory markers have increased  I do not feel there is a role for another surgery at this time.  It would be very difficult to get to the epidural space for any type of infection there.  We cannot treat discitis surgically.  I cannot get to his psoas musculature.  I would recommend draining the left psoas abscess with interventional radiology and sending this fluid for culture, and try to make sure that he has not developed resistance to the Ancef.  Infection appears to be progressing despite treatment with Ancef and I do not think there is a surgery that is going to change the the clinical course with the current antibiotic regimen.  Therefore we have to consider antibiotic resistance or whether or not he needs an  antibiotic change, or whether or not the current regimen is adequate for this gentleman.  I will certainly make Dr. Venetia Constable aware of the new MRI and the issues taking place so that he can become involved in the decision-making once again.

## 2021-02-10 NOTE — Progress Notes (Signed)
Pt refuses CPAP for the night.  

## 2021-02-10 NOTE — Progress Notes (Addendum)
Physical Therapy Note  Patient Details  Name: [name] MRN: [id number] Date of Birth: Oct 18, 1965 Today's Date: 02/10/2021    Attempted to see patient at scheduled time of 10:00, pt reports he has been vomiting and is experiencing severe abdominal pain. Nursing aware and in on way to room to address pt's complaints. Will attempt to follow up later this PM to make up therapy time as able. Pt missed 60 min of scheduled therapy time due to abdominal pain and vomiting.   Addendum: attempted to see patient in PM to makeup therapy session, pt reports ongoing pain and vomiting, declines participation at this time.  Excell Seltzer, PT, DPT, CSRS  02/10/2021, 12:25 PM

## 2021-02-10 NOTE — Progress Notes (Signed)
Occupational Therapy Session Note  Patient Details  Name: CUSTER PIMENTA MRN: [id number] Date of Birth: 1965-11-24  Today's Date: 02/10/2021 OT Individual Time: 0700-0815 OT Individual Time Calculation (min): 75 min    Short Term Goals: Week 2:  OT Short Term Goal 1 (Week 2): STG=LTG 2/2 ELOS (continue working towards supervision/mod I LTG)  Skilled Therapeutic Interventions/Progress Updates:    Pt resting in bed upon arrival eating breakfast. Pt requested to use toilet. Supine>sit EOB with supervision. Pt requests bed elevated to relieve pain in back. Sit<>stand and amb with RW to bathroom with CGA and assistance to manage IV and wound vac. Toilet transfers with CGA. Max A for toileting tasks. Pt amb back to room and completed UB bathing and grooming tasks seated in w/c at sink. Pt declined LB bathing. Pt requested to return to bed 2/2 increase pain in back with activity. Myofascial release/soft tissue mobilizations to Rt lower back with some relief reported. Sit>supine with mod A for BLE management. Pt able to reposition in bed without assistance. Pt requires rest breaks during activity tor pain mgmt. Pt remained in bed with all needs withn reach and bed alarm activated.   Therapy Documentation Precautions:  Precautions Precautions: Fall, Back, Other (comment) Precaution Booklet Issued: No Precaution Comments: wound vac, pt able to recall back precautions Required Braces or Orthoses: Other Brace Other Brace: no brace needed per order, d/t wound vac Restrictions Weight Bearing Restrictions: No Pain:  Pt resports 7/10 Rt lower back pain; muscle rub applied and myofascial release, repositinoed  Therapy/Group: Individual Therapy  [name] 02/10/2021, 9:09 AM

## 2021-02-10 NOTE — Progress Notes (Signed)
[place] PHYSICAL MEDICINE & REHABILITATION PROGRESS NOTE  Subjective/Complaints:  Doesn't feel weaker-  Having N/V x3-4 this AM KUB shows constipation, but no ileus/bowel obstruction.  Feels like bladder stretched too tight- needs cathing.  Stomach on fire- nauseated.   Pooped x2 yesterday after meds.   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 rate 16, height '6\' 2"'  (1.88 m), weight 135.6 kg, SpO2 94 %. DG Abd 1 View  Result Date: 02/10/2021 CLINICAL DATA:  Nausea, vomiting, abdominal pain EXAM: ABDOMEN - 1 VIEW COMPARISON:  01/21/2021 FINDINGS: Examination is technically difficult due to patient's body habitus. As far as seen, there is no dilation of small bowel loops. Gas and stool are present in colon. Stomach is not distended. Laminectomy and fusion is seen in lumbar spine. There is soft tissue density in the right paraspinal region which may be an artifact or prominent psoas. In previous MRI done on 02/09/2021, right psoas was not enlarged. IMPRESSION: Nonspecific bowel gas pattern. Electronically Signed   By: Elmer Picker M.D.   On: 02/10/2021 14:24   MR Lumbar Spine W Wo Contrast  Result Date: 02/09/2021 CLINICAL DATA:  Initial evaluation for bacteremia following back surgery, abscess. EXAM: MRI LUMBAR SPINE WITHOUT AND WITH CONTRAST TECHNIQUE: Multiplanar and multiecho pulse sequences of the lumbar spine were obtained without and with intravenous contrast. CONTRAST:  70m GADAVIST GADOBUTROL 1 MMOL/ML IV SOLN COMPARISON:  CT from 01/24/2021. FINDINGS: Segmentation: Standard. Lowest well-formed disc space labeled the L5-S1 level. Alignment: Sigmoid scoliotic curvature with straightening of the normal lumbar lordosis. 2-3 mm retrolisthesis of  L1 on L2. Vertebrae: Extensive postoperative changes from prior PLIF at L1 through S1. Hardware better evaluated on prior CT. Abnormal edema and enhancement seen about the L1-2 interspace, consistent with osteomyelitis discitis. Associated paraspinous edema/phlegmon within the adjacent left greater than right psoas musculature. Superimposed approximate 1 cm soft tissue abscess within the left psoas muscle (series 13, image 13). Irregular enhancing material within the left ventral epidural space extending from L1-2 to approximately L2-3 suspicious for an associated epidural phlegmon and/or abscess, difficult to visualize given streak artifact from adjacent hardware (series 13, image 14) mild flattening of the adjacent left ventral and lateral thecal sac without high-grade stenosis. No other convincing epidural or subdural collections. No other loculated or discrete soft tissue collections. Remainder of the visualized bone marrow otherwise grossly within normal limits. No evidence for acute or interval fracture. No discrete or worrisome osseous lesions. Conus medullaris and cauda equina: Conus extends to approximately the L1 level, obscured by adjacent hardware. Conus and cauda equina appear grossly normal. Paraspinal and other soft tissues: Extensive postoperative changes present throughout the posterior paraspinous soft tissues extending from L1 through the sacrum. Diffuse stranding and enhancement without discrete abscess or collection. Visualized visceral structures unremarkable. Disc levels: T11-12: Seen only on sagittal projection. Disc desiccation with mild disc bulge. Mild right-sided facet spurring. No stenosis. T12-L1: Disc desiccation without significant disc bulge. No stenosis. L1-2: Prior PLIF. Findings concerning for osteomyelitis discitis as above. Small amount of epidural phlegmon within the ventral left epidural space posterior to the L2 vertebral body (series 13, image 14). Residual moderate spinal  stenosis at this level. Left neural foramen appears patent. Possible residual mild to moderate  right neural foraminal stenosis. L2-3: Prior PLIF. Small amount of epidural phlegmon within the left ventral epidural space from above. No significant spinal stenosis at this level. Foramina appear grossly patent. L3-4: Prior PLIF. No residual spinal stenosis. Residual mild to moderate left neural foraminal narrowing. Right neural foramen appears patent. L4-5: Prior PLIF. No residual spinal stenosis. Residual mild left greater than right L4 foraminal narrowing. L5-S1: Prior PLIF. No residual spinal stenosis. Moderate right with mild left L5 foraminal narrowing. IMPRESSION: 1. Prior PLIF at L1 through S1. 2. 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 Residual moderate spinal stenosis at the L1-2 level. 3. Associated paraspinous edema/phlegmon within the adjacent left greater than right psoas musculature, with superimposed 1 cm left psoas soft tissue abscess. 4. Extensive postoperative changes throughout the posterior paraspinous soft tissues without discrete abscess or collection. Electronically Signed   By: Jeannine Boga M.D.   On: 02/09/2021 20:30   Recent Labs    02/08/21 0423 02/09/21 0525  WBC 6.1 6.1  HGB 7.7* 7.5*  HCT 23.5* 22.7*  PLT 252 268    Recent Labs    02/09/21 0525 02/10/21 0345  NA 131* 133*  K 3.9 4.1  CL 99 102  CO2 25 25  GLUCOSE 133* 133*  BUN 32* 22*  CREATININE 1.15 0.91  CALCIUM 8.4* 8.6*    Intake/Output Summary (Last 24 hours) at 02/10/2021 1624 Last data filed at 02/10/2021 1300 Gross per 24 hour  Intake 600 ml  Output 2700 ml  Net -2100 ml        Physical Exam: BP 115/68 (BP Location: Left Arm)   Pulse 92   Temp 98.4 F (36.9 C)   Resp 16   Ht '6\' 2"'  (1.88 m)   Wt 135.6 kg   SpO2 94%   BMI  38.38 kg/m           General: awake, alert, appropriate, laying in bed; appears like feels ill; wife at bedside; NAD HENT: conjugate gaze; oropharynx moist CV: regular rate; no JVD Pulmonary: CTA B/L; no W/R/R- good air movement GI: soft, TTP epigastric and LLQ- no rebound; some distension vs protuberance; normoactive BS Psychiatric: appropriate- flat Neurological: Ox3  Skin: Warm and dry. VAC in place- suctioning working on back- looks the same-  Musc: very TTP R low back -palpable trigger point- no change Neuro: Alert Motor: Bilateral upper extremities: 5/5 proximal distal LE- HF 4/5; KE/KF 4+/5; Df/PF 4+/5  Sensation intact  Picture 10/26   Assessment/Plan: 1. Functional deficits which require 3+ hours per day of interdisciplinary therapy in a comprehensive inpatient rehab setting. Physiatrist is providing close team supervision and 24 hour management of active medical problems listed below. Physiatrist and rehab team continue to assess barriers to discharge/monitor patient progress toward functional and medical goals   Care Tool:  Bathing    Body parts bathed by patient: Right arm, Left arm, Chest, Abdomen, Front perineal area, Right upper leg, Left upper leg, Face   Body parts bathed by helper: Buttocks, Right lower leg, Left lower leg     Bathing assist Assist Level: Minimal Assistance - Patient > 75%     Upper Body Dressing/Undressing Upper body dressing   What is the patient wearing?: Hospital gown only    Upper body assist Assist Level: Minimal Assistance - Patient > 75%    Lower Body Dressing/Undressing Lower body dressing  What is the patient wearing?: Pants     Lower body assist Assist for lower body dressing: Supervision/Verbal cueing     Toileting Toileting    Toileting assist Assist for toileting: Maximal Assistance - Patient 25 - 49%     Transfers Chair/bed transfer  Transfers assist  Chair/bed transfer activity did not  occur: Safety/medical concerns  Chair/bed transfer assist level: Contact Guard/Touching assist     Locomotion Ambulation   Ambulation assist   Ambulation activity did not occur: Safety/medical concerns  Assist level: Contact Guard/Touching assist Assistive device: Walker-rolling Max distance: 70 ft   Walk 10 feet activity   Assist  Walk 10 feet activity did not occur: Safety/medical concerns  Assist level: Contact Guard/Touching assist Assistive device: Walker-rolling   Walk 50 feet activity   Assist Walk 50 feet with 2 turns activity did not occur: Safety/medical concerns  Assist level: Contact Guard/Touching assist Assistive device: Walker-rolling    Walk 150 feet activity   Assist Walk 150 feet activity did not occur: Safety/medical concerns         Walk 10 feet on uneven surface  activity   Assist Walk 10 feet on uneven surfaces activity did not occur: Safety/medical concerns         Wheelchair     Assist Is the patient using a wheelchair?: Yes   Wheelchair activity did not occur: Safety/medical concerns         Wheelchair 50 feet with 2 turns activity    Assist    Wheelchair 50 feet with 2 turns activity did not occur: Safety/medical concerns       Wheelchair 150 feet activity     Assist  Wheelchair 150 feet activity did not occur: Safety/medical concerns        Medical Problem List and Plan: 1.  Lumbar radiculopathy/myelopathy secondary to nerve root compression- s/p decompression and wash out after bacteremia from klebsiella pneumonia Con't PT and OT- participating better- need to go home with VAC and IV ABX  -con't PT and OT- 15/7  2.  Impaired mobility: continue Lovenox  10/27- if not walking enough, might need to go home on it  10/30- will hold Lovenox for IR to do biopsy/sampling- need ot restart after IR procedure.              -antiplatelet therapy: N/A 3. Postoperative pain: Continue Oxycodone 15 mg  prn. Resumed MSContin for more consistent pain relief. Scheduled Tizanidine TID. Resumed muscle rub to right thigh as it was helping before.  10/27- will add valium 5 mg nightly for muscle spasms  10/28- feels like pain a little better- reminded has IV Dilaudid for VAC changes- will take  10/30- pain slightly better- but con't regimen for now  Monitor with increased exertion 4. Mood: Team to provide ego support. LCSW to follow for evaluation and support.              -antipsychotic agents: N/A 5. Neuropsych: This patient is capable of making decisions on his own behalf. 6. Skin/Wound Care:  Monitor wound daily. Added vitamins and protein supplement to help promote wound healing.  7. Fluids/Electrolytes/Nutrition: Monitor I/Os 8. Klebsiella bacteremia w/diskitis: On Cefazolin 2 g every 8 hrs with end date 03/11/21 -- Check weekly CRP/ESR.   10/27- will order labs again in AM and if more elevated, will call ID 10/28- ESR up to 104 from 85 3 days ago and CRP up to 19.5 - called ID and they suggested MRI of lumbar spine-  with contrast- is ordered with valium 10 mg x1 for test  10/29- CT looks OK- MRI pending- ID on board 10/30- psoas abscesses and L1/2 discitis/osteomyelitis- called ID and NSU x2 each to discuss MRI results- they both came and saw pt- will get IR to do sampling so can make sure have right bacteria  9. HTN: Monitor BP tid--continue Microzide and cozaar daily.  10/24- d/c HCTZ due to low Na  Monitor with increased mobility 10. T2DM with hyperglycemia: CM diet. On Metformin 1000 mg bid  10/26- BG's 140s-160s usually- con't regimen  10/29- somehow CBGs aren't being done- reordered  10/30- BG's 140s-160s usually- con't regimen  Monitor with increased activity 11. Neurogenic bladder: with urinary retention On flomax. Increased to 0.8 mg nightly   10/24- still not voiding- will check U/A and Cx 10/26- no voiding- U/A (-) 13. Neuropathy: Continue Neurontin 600 mg TID.  14. ABLA:  monitor for signs of bleeding. Hgb stabilizing to 8 range.  Hb 8.9 on 10/21  10/28- Hb down to 7.7- which might be lower, since kidney function is worse- will recheck in AM  10/29- Hb 7.5- likely from hemodilution and wound VAC- con't to recheck - Monday  10/30- might need blood transfusion Cont to monitor 15. 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 -- Follow up with surgery after discharge.  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  17. Abdominal pain: Much better--dysesthesias?  18. Hypomagnesemia: Resumed Mag Ox.   Mag 1.6 on 10/21, labs ordered for Monday  10/24- Mg 2.1  10/29- Mg 2.1- monitor 19. Hyponatremia  Na+ 132 on 10/21, labs ordered for Monday  10/24- Na down to 128- will do fluid restriction 1500cc- and stop HCTZ  10/25- Na 127- will recheck in AM after off HCTZ and on fluid restriction  10/26- Na up to 129- con't fluid restriction and recheck Friday  10/29- Na up to 131- recheck in AM 20. Hypoalbuminemia  Supplement initiated on 10/21 21. Azotemia/ARI  10/24- Cr up to 1.32- usually <1- BUN 29- will recheck in AM and if worse, will start more IVFs  10/25 Cr down to 1.13 and BUN stable at 29- will give IVFs 75 cc x 12 hours and recheck in AM  10/26- BUN still 29 and Cr 1.21- will recheck Friday- might need more IVFs at that time.  10/28- Cr up to 1.6 and BUN up to 38- will give 100 cc IVF NS x 24 hours and recheck in AM-   10/29- will give 12 more hours- then stop- and recheck in AM- Cr and BUN better 10/30- Cr down to 0.91 and BUN 22- much better- con't regimen 22 Constipation  10/24- will give Sorbitol 66 and increase bowel meds- increase Senokot to 2 tabs BID.   10/29- Sorbitol 60cc- if doesn't work, give lactulose 23. Hypokalemia  10/28- will give KCL x2 40 mEq and recheck in AM- also checking Mg since can cause it to be low as well. 10/29- up to 3.9-   24. N/V  10/30- will give antinausea medicine- KUB (-) except for constipation-  has pooped 2x-  Will monitor and might need more cleaning out  I spent a total of 47 minutes on total care- >50% on coordination of care- calling ID x2 as well as NSU x2 and speaking with pt's wife about MRI results.   LOS: 10 days A FACE TO FACE EVALUATION WAS PERFORMED  Gleason Ardoin 02/10/2021, 4:24 PM

## 2021-02-11 LAB — C-REACTIVE PROTEIN: CRP: 9.5 mg/dL — ABNORMAL HIGH (ref <1.0)

## 2021-02-11 LAB — CBC
HCT: 23.1 % — ABNORMAL LOW (ref 39.0–52.0)
Hemoglobin: 7.5 g/dL — ABNORMAL LOW (ref 13.0–17.0)
MCH: 28.6 pg (ref 26.0–34.0)
MCHC: 32.5 g/dL (ref 30.0–36.0)
MCV: 88.2 fL (ref 80.0–100.0)
Platelets: 234 10*3/uL (ref 150–400)
RBC: 2.62 MIL/uL — ABNORMAL LOW (ref 4.22–5.81)
RDW: 14 % (ref 11.5–15.5)
WBC: 6.1 10*3/uL (ref 4.0–10.5)
nRBC: 0 % (ref 0.0–0.2)

## 2021-02-11 LAB — BASIC METABOLIC PANEL
Anion gap: 6 (ref 5–15)
BUN: 13 mg/dL (ref 6–20)
CO2: 25 mmol/L (ref 22–32)
Calcium: 8.6 mg/dL — ABNORMAL LOW (ref 8.9–10.3)
Chloride: 103 mmol/L (ref 98–111)
Creatinine, Ser: 0.86 mg/dL (ref 0.61–1.24)
GFR, Estimated: >60 mL/min (ref >60)
Glucose, Bld: 121 mg/dL — ABNORMAL HIGH (ref 70–99)
Potassium: 3.9 mmol/L (ref 3.5–5.1)
Sodium: 134 mmol/L — ABNORMAL LOW (ref 135–145)

## 2021-02-11 LAB — GLUCOSE, CAPILLARY
Glucose-Capillary: 103 mg/dL — ABNORMAL HIGH (ref 70–99)
Glucose-Capillary: 181 mg/dL — ABNORMAL HIGH (ref 70–99)
Glucose-Capillary: 151 mg/dL — ABNORMAL HIGH (ref 70–99)

## 2021-02-11 LAB — SEDIMENTATION RATE: Sed Rate: 96 mm/hr — ABNORMAL HIGH (ref 0–16)

## 2021-02-11 MED ORDER — SODIUM CHLORIDE 0.9% FLUSH
10.0000 mL | INTRAVENOUS | Status: DC | PRN
  Administered 2021-02-11: 20 mL
  Administered 2021-02-13 – 2021-03-05 (×5): 10 mL

## 2021-02-11 MED ORDER — ENOXAPARIN SODIUM 40 MG/0.4ML IJ SOSY
40.0000 mg | PREFILLED_SYRINGE | INTRAMUSCULAR | Status: DC
  Administered 2021-02-11 – 2021-03-08 (×26): 40 mg via SUBCUTANEOUS
  Filled 2021-02-11 (×26): qty 0.4

## 2021-02-11 MED ORDER — HYDROMORPHONE HCL 1 MG/ML IJ SOLN
1.0000 mg | INTRAMUSCULAR | Status: DC
  Administered 2021-02-13 – 2021-03-08 (×11): 1 mg via INTRAVENOUS
  Filled 2021-02-11 (×11): qty 1

## 2021-02-11 NOTE — Progress Notes (Signed)
Physical Therapy Session Note  Patient Details  Name: [name] MRN: [id number] Date of Birth: 09-Jun-1965  Today's Date: 02/11/2021 PT Amount of Missed Time (min): 75 Minutes PT Missed Treatment Reason: Pain  Short Term Goals: Week 2:  PT Short Term Goal 1 (Week 2): =LTG due to ELOS  Skilled Therapeutic Interventions/Progress Updates:    Attempted to see patient for scheduled therapy session. Pt reports he is currently in 10/10 pain in his R low back as well as at site of wound due to not receiving pain medication until after his wound vacuum dressing change this morning. Pt declines any participation in therapy session due to significant pain, declines to even attempt to sit up to EOB. Will follow up per POC as time allows. Pt missed 75 min of scheduled therapy session due to pain and refusal.  Therapy Documentation Precautions:  Precautions Precautions: Fall, Back, Other (comment) Precaution Booklet Issued: No Precaution Comments: wound vac, pt able to recall back precautions Required Braces or Orthoses: Other Brace Other Brace: no brace needed per order, d/t wound vac Restrictions Weight Bearing Restrictions: No      Therapy/Group: Individual Therapy   Excell Seltzer, PT, DPT, CSRS  02/11/2021, 11:59 AM

## 2021-02-11 NOTE — Progress Notes (Signed)
[place] PHYSICAL MEDICINE & REHABILITATION PROGRESS NOTE  Subjective/Complaints:  Appreciate ID, Neurosurgery and radiology notes  Had severe LBP this am , his medication time became off schedule   ROS:   Pt denies SOB, CP, -N/V/C/D,   Objective: Vital Signs: Blood pressure 137/76, pulse 99, temperature 98.1 F (36.7 C), resp. rate 16, height '6\' 2"'  (1.88 m), weight 135.6 kg, SpO2 98 %. DG Abd 1 View  Result Date: 02/10/2021 CLINICAL DATA:  Nausea, vomiting, abdominal pain EXAM: ABDOMEN - 1 VIEW COMPARISON:  01/21/2021 FINDINGS: Examination is technically difficult due to patient's body habitus. As far as seen, there is no dilation of small bowel loops. Gas and stool are present in colon. Stomach is not distended. Laminectomy and fusion is seen in lumbar spine. There is soft tissue density in the right paraspinal region which may be an artifact or prominent psoas. In previous MRI done on 02/09/2021, right psoas was not enlarged. IMPRESSION: Nonspecific bowel gas pattern. Electronically Signed   By: Elmer Picker M.D.   On: 02/10/2021 14:24   MR Lumbar Spine W Wo Contrast  Result Date: 02/09/2021 CLINICAL DATA:  Initial evaluation for bacteremia following back surgery, abscess. EXAM: MRI LUMBAR SPINE WITHOUT AND WITH CONTRAST TECHNIQUE: Multiplanar and multiecho pulse sequences of the lumbar spine were obtained without and with intravenous contrast. CONTRAST:  7m GADAVIST GADOBUTROL 1 MMOL/ML IV SOLN COMPARISON:  CT from 01/24/2021. FINDINGS: Segmentation: Standard. Lowest well-formed disc space labeled the L5-S1 level. Alignment: Sigmoid scoliotic curvature with straightening of the normal lumbar lordosis. 2-3 mm retrolisthesis of L1 on L2. Vertebrae: Extensive postoperative changes from prior PLIF at L1 through S1. Hardware better evaluated on prior CT. Abnormal edema and enhancement seen about the L1-2 interspace, consistent with osteomyelitis discitis. Associated paraspinous  edema/phlegmon within the adjacent left greater than right psoas musculature. Superimposed approximate 1 cm soft tissue abscess within the left psoas muscle (series 13, image 13). Irregular enhancing material within the left ventral epidural space extending from L1-2 to approximately L2-3 suspicious for an associated epidural phlegmon and/or abscess, difficult to visualize given streak artifact from adjacent hardware (series 13, image 14) mild flattening of the adjacent left ventral and lateral thecal sac without high-grade stenosis. No other convincing epidural or subdural collections. No other loculated or discrete soft tissue collections. Remainder of the visualized bone marrow otherwise grossly within normal limits. No evidence for acute or interval fracture. No discrete or worrisome osseous lesions. Conus medullaris and cauda equina: Conus extends to approximately the L1 level, obscured by adjacent hardware. Conus and cauda equina appear grossly normal. Paraspinal and other soft tissues: Extensive postoperative changes present throughout the posterior paraspinous soft tissues extending from L1 through the sacrum. Diffuse stranding and enhancement without discrete abscess or collection. Visualized visceral structures unremarkable. Disc levels: T11-12: Seen only on sagittal projection. Disc desiccation with mild disc bulge. Mild right-sided facet spurring. No stenosis. T12-L1: Disc desiccation without significant disc bulge. No stenosis. L1-2: Prior PLIF. Findings concerning for osteomyelitis discitis as above. Small amount of epidural phlegmon within the ventral left epidural space posterior to the L2 vertebral body (series 13, image 14). Residual moderate spinal stenosis at this level. Left neural foramen appears patent. Possible residual mild to moderate right neural foraminal stenosis. L2-3: Prior PLIF. Small amount of epidural phlegmon within the left ventral epidural space from above. No significant spinal  stenosis at this level. Foramina appear grossly patent. L3-4: Prior PLIF. No residual spinal stenosis. Residual mild to moderate left neural foraminal  narrowing. Right neural foramen appears patent. L4-5: Prior PLIF. No residual spinal stenosis. Residual mild left greater than right L4 foraminal narrowing. L5-S1: Prior PLIF. No residual spinal stenosis. Moderate right with mild left L5 foraminal narrowing. IMPRESSION: 1. Prior PLIF at L1 through S1. 2. 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 Residual moderate spinal stenosis at the L1-2 level. 3. Associated paraspinous edema/phlegmon within the adjacent left greater than right psoas musculature, with superimposed 1 cm left psoas soft tissue abscess. 4. Extensive postoperative changes throughout the posterior paraspinous soft tissues without discrete abscess or collection. Electronically Signed   By: Jeannine Boga M.D.   On: 02/09/2021 20:30   Recent Labs    02/09/21 0525 02/11/21 0038  WBC 6.1 6.1  HGB 7.5* 7.5*  HCT 22.7* 23.1*  PLT 268 234     Recent Labs    02/10/21 0345 02/11/21 0038  NA 133* 134*  K 4.1 3.9  CL 102 103  CO2 25 25  GLUCOSE 133* 121*  BUN 22* 13  CREATININE 0.91 0.86  CALCIUM 8.6* 8.6*     Intake/Output Summary (Last 24 hours) at 02/11/2021 1132 Last data filed at 02/11/2021 0859 Gross per 24 hour  Intake 820 ml  Output 1925 ml  Net -1105 ml         Physical Exam: BP 137/76 (BP Location: Left Arm)   Pulse 99   Temp 98.1 F (36.7 C)   Resp 16   Ht '6\' 2"'  (1.88 m)   Wt 135.6 kg   SpO2 98%   BMI 38.38 kg/m    General: No acute distress Mood and affect are appropriate Heart: Regular rate and rhythm no rubs murmurs or extra sounds Lungs: Clear to auscultation, breathing unlabored, no rales or wheezes Abdomen: Positive bowel sounds,  soft nontender to palpation, nondistended Extremities: No clubbing, cyanosis, or edema Skin: No evidence of breakdown, no evidence of rash    Skin: Warm and dry. VAC in place- suctioning working on back- looks the same-  Musc: very TTP R low back -palpable trigger point- no change Neuro: Alert Motor: Bilateral upper extremities: 5/5 proximal distal LE- HF 4/5; KE/KF 4+/5; Df/PF 4+/5  Sensation intact  Picture 10/26   Assessment/Plan: 1. Functional deficits which require 3+ hours per day of interdisciplinary therapy in a comprehensive inpatient rehab setting. Physiatrist is providing close team supervision and 24 hour management of active medical problems listed below. Physiatrist and rehab team continue to assess barriers to discharge/monitor patient progress toward functional and medical goals   Care Tool:  Bathing    Body parts bathed by patient: Right arm, Left arm, Chest, Abdomen, Front perineal area, Right upper leg, Left upper leg, Face   Body parts bathed by helper: Buttocks, Right lower leg, Left lower leg     Bathing assist Assist Level: Minimal Assistance - Patient > 75%     Upper Body Dressing/Undressing Upper body dressing   What is the patient wearing?: Hospital gown only    Upper body assist Assist Level: Minimal Assistance - Patient > 75%    Lower Body Dressing/Undressing Lower body dressing      What is the patient wearing?: Pants     Lower body assist Assist for lower body dressing: Supervision/Verbal cueing     Toileting Toileting    Toileting assist Assist for toileting: Maximal Assistance - Patient 25 -  49%     Transfers Chair/bed transfer  Transfers assist  Chair/bed transfer activity did not occur: Safety/medical concerns  Chair/bed transfer assist level: Contact Guard/Touching assist     Locomotion Ambulation   Ambulation assist   Ambulation activity did not occur: Safety/medical concerns  Assist level: Contact  Guard/Touching assist Assistive device: Walker-rolling Max distance: 70 ft   Walk 10 feet activity   Assist  Walk 10 feet activity did not occur: Safety/medical concerns  Assist level: Contact Guard/Touching assist Assistive device: Walker-rolling   Walk 50 feet activity   Assist Walk 50 feet with 2 turns activity did not occur: Safety/medical concerns  Assist level: Contact Guard/Touching assist Assistive device: Walker-rolling    Walk 150 feet activity   Assist Walk 150 feet activity did not occur: Safety/medical concerns         Walk 10 feet on uneven surface  activity   Assist Walk 10 feet on uneven surfaces activity did not occur: Safety/medical concerns         Wheelchair     Assist Is the patient using a wheelchair?: Yes   Wheelchair activity did not occur: Safety/medical concerns         Wheelchair 50 feet with 2 turns activity    Assist    Wheelchair 50 feet with 2 turns activity did not occur: Safety/medical concerns       Wheelchair 150 feet activity     Assist  Wheelchair 150 feet activity did not occur: Safety/medical concerns        Medical Problem List and Plan: 1.  Lumbar radiculopathy/myelopathy secondary to nerve root compression- s/p decompression and wash out after bacteremia from klebsiella pneumonia Con't PT and OT- participating better- need to go home with VAC and IV ABX  -con't PT and OT- 15/7  2.  Impaired mobility: continue Lovenox  Resume lovenox given that pt not undergoing IR procedure  Dr Zada Finders to f/u             -antiplatelet therapy: N/A 3. Postoperative pain: Continue Oxycodone 15 mg prn. Resumed MSContin for more consistent pain relief. Scheduled Tizanidine TID. Resumed muscle rub to right thigh as it was helping before.  10/27- will add valium 5 mg nightly for muscle spasms  10/28- feels like pain a little better- reminded has IV Dilaudid for VAC changes- will take  10/30- pain slightly  better- but con't regimen for now  Monitor with increased exertion 4. Mood: Team to provide ego support. LCSW to follow for evaluation and support.              -antipsychotic agents: N/A 5. Neuropsych: This patient is capable of making decisions on his own behalf. 6. Skin/Wound Care:  Monitor wound daily. Added vitamins and protein supplement to help promote wound healing.  7. Fluids/Electrolytes/Nutrition: Monitor I/Os 8. Klebsiella bacteremia w/diskitis: On Cefazolin 2 g every 8 hrs with end date 03/11/21 -- Check weekly CRP/ESR.   10/27- will order labs again in AM and if more elevated, will call ID 10/28- ESR up to 104 from 85 3 days ago and CRP up to 19.5 - called ID and they suggested MRI of lumbar spine- with contrast- is ordered with valium 10 mg x1 for test  10/29- CT looks OK- MRI pending- ID on board 10/30- psoas abscesses and L1/2 discitis/osteomyelitis- called ID and NSU x2 each to discuss MRI results- they both came and saw pt- will get IR to do sampling so can make sure have right  bacteria  10/31 - upon further review of MRI , IR feels Left psoas fluid is edema rather than abscess, no drainage recommended  9. HTN: Monitor BP tid--continue Microzide and cozaar daily.  10/24- d/c HCTZ due to low Na  Monitor with increased mobility 10. T2DM with hyperglycemia: CM diet. On Metformin 1000 mg bid  10/26- BG's 140s-160s usually- con't regimen  10/29- somehow CBGs aren't being done- reordered  10/30- BG's 140s-160s usually- con't regimen  CBG (last 3)  Recent Labs    02/10/21 1702 02/10/21 2107 02/11/21 0606  GLUCAP 131* 129* 103*  Controlled 10/31  11. Neurogenic bladder: with urinary retention On flomax. Increased to 0.8 mg nightly   10/24- still not voiding- will check U/A and Cx 10/26- no voiding- U/A (-) 13. Neuropathy: Continue Neurontin 600 mg TID.  14. ABLA: monitor for signs of bleeding. Hgb stabilizing to 8 range.  Hb 8.9 on 10/21  10/28- Hb down to 7.7- which  might be lower, since kidney function is worse- will recheck in AM  10/29- Hb 7.5- likely from hemodilution and wound VAC- con't to recheck - Monday  10/30- might need blood transfusion Cont to monitor 15. 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 -- Follow up with surgery after discharge.  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  17. Abdominal pain: Much better--dysesthesias?  18. Hypomagnesemia: Resumed Mag Ox.   Mag 1.6 on 10/21, labs ordered for Monday  10/24- Mg 2.1  10/29- Mg 2.1- monitor 19. Hyponatremia  Na+ 132 on 10/21, labs ordered for Monday  10/24- Na down to 128- will do fluid restriction 1500cc- and stop HCTZ  10/25- Na 127- will recheck in AM after off HCTZ and on fluid restriction  10/26- Na up to 129- con't fluid restriction and recheck Friday  10/29- Na up to 131- recheck in AM 10/31 Na+ up to 134  20. Hypoalbuminemia  Supplement initiated on 10/21 21. Azotemia/ARI   10/31 resolved , off IVF  , cont to monitor weekly   22 Constipation  10/24- will give Sorbitol 66 and increase bowel meds- increase Senokot to 2 tabs BID.   10/29- Sorbitol 60cc- if doesn't work, give lactulose 23. Hypokalemia  10/28- will give KCL x2 40 mEq and recheck in AM- also checking Mg since can cause it to be low as well.  10/31 resolved K+ 3.9, off supplementation  24. N/V  10/30- will give antinausea medicine- KUB (-) except for constipation- has pooped 2x-  Will monitor and might need more cleaning out   LOS: 11 days A FACE TO [place] E [name] 02/11/2021, 11:32 AM

## 2021-02-11 NOTE — Progress Notes (Signed)
Neurosurgery Service Progress Note  Subjective: No acute events overnight, some continued right buttock / low back pain, none on the left   Objective: Vitals:   02/10/21 0420 02/10/21 1553 02/10/21 1915 02/11/21 0425  BP: 111/71 115/68 (!) 147/85 137/76  Pulse: (!) 103 92 100 99  Resp: 17 16 16 16   Temp: 97.9 F (36.6 C) 98.4 F (36.9 C) 98.4 F (36.9 C) 98.1 F (36.7 C)  TempSrc: Oral     SpO2: 98% 94% 99% 98%  Weight:      Height:        Physical Exam: Wound vac in place, BLE strength 5/5  Assessment & Plan: 55 y.o. man s/p L1-S1 TLIF/decompression/PLF, post-op wound infection with K pneumoniae s/p washout and wound vac placement, rpt MRI with some changes at L1-2 c/f discitis and a small adjacent psoas abscess.  -I agree w/ Dr. Ronnald Ramp, I do not think that surgical intervention is going to be helpful and re-opening his wound where it has healed could be potentially harmful. If we are concerned about Abx resistance, given what an atypical organism he started with, could try getting IR to tap the psoas abscess but it's quite small. From a NSGY perspective, the changes in the disc space at L1-2 are a bit concerning, but certainly can be seen post-op due to discectomy and the associated inflammation. If there is any concern for clinical progression, can always re-scan him and see if there has been resolution or progression of the psoas component, should be easier to follow radiographically since it will have less hardware artifact in that area.   Joyice Faster Kamaya Keckler  02/11/21 12:11 PM

## 2021-02-11 NOTE — Progress Notes (Signed)
Pt with right SL PICC placed with ECG and documented out at 0cm. PICC noted out 3cm at this time. Suggest CXR to determine PICC tip location. Bedside RN to relay message to MD in am.

## 2021-02-11 NOTE — Progress Notes (Signed)
IR was requested for image guided aspiration/drain placement for possible psoas abscess.   Case was reviewed by Dr. Laurence Ferrari, the MRI findings appears to be edema not posse abscess. No indication for aspiration/drain placement.    Ordering provider and RN notified via secure chat.   Will delete the IR Rad eval order.  Please call IR for questions and concerns.   Jalynn Betzold H Shonte Beutler PA-C 02/11/2021 8:25 AM

## 2021-02-11 NOTE — Progress Notes (Signed)
RT offered to place pt on CPAP for the night pt declined at this time.

## 2021-02-11 NOTE — Progress Notes (Signed)
ID Progress Note  55 yo M with klebsiella surgical site infection from previous fusion,L1-S1 TLIF/decompression/PLF s/p washout and wound vac. Repeat mri has some changes and small 1cm psoas abscess, too small to intervene. Continue to treat with cefazolin for now. Consider repeat imaging in 2 weeks to evaluate for further changes.   [name] for Infectious Diseases [phone]

## 2021-02-11 NOTE — Progress Notes (Signed)
Occupational Therapy Session Note  Patient Details  Name: [name] MRN: [id number] Date of Birth: 07/10/1965  Today's Date: 02/11/2021 OT Individual Time: 0700-0805 OT Individual Time Calculation (min): 65 min    Short Term Goals: Week 2:  OT Short Term Goal 1 (Week 2): STG=LTG 2/2 ELOS (continue working towards supervision/mod I LTG)  Skilled Therapeutic Interventions/Progress Updates:    Pt eating breakfast in bed this morning. Pt states his pain is worse this morning as complared to previous day. Pt requested to use toilet in bthroom. Supine>sit EOB with supervision. Amb with RW to bathroom with assistance to manage wound vac. Max A for toileting. Pt returned to w/c for washing up but pain became too intense that he needed to return to bed. Sit>supine with mod A for BLE management. Pt able to reposition in bed without assistance. Pain meds requested from RN. Pt remained in bed with all needs within reach. Bed alarm activated. Wound vac plugged into wall outlet.   Therapy Documentation Precautions:  Precautions Precautions: Fall, Back, Other (comment) Precaution Booklet Issued: No Precaution Comments: wound vac, pt able to recall back precautions Required Braces or Orthoses: Other Brace Other Brace: no brace needed per order, d/t wound vac Restrictions Weight Bearing Restrictions: No General: General OT Amount of Missed Time: 10 Minutes Pain:  Pt c/o 10/10 Rt lower back pain in addition to incision site; muscle rub applied to lower back and myofascial release to lower back; repositioned   Therapy/Group: Individual Therapy  [name] 02/11/2021, 8:09 AM

## 2021-02-11 NOTE — Consult Note (Signed)
[place] Nurse wound follow up Patient receiving care in [facility] [id number] Wound type: Surgical lumbar wound Wound bed: red/pink granulation tissue with minimal yellow fibrinous tissue throughout the wound. White foam has helped with the bleeding.  Drainage (amount, consistency, odor) Serosanguinous  in the canister. Periwound: minor maceration around the edges of the wound with some blistering.  Dressing procedure/placement/frequency: 3 pieces of black foam removed, 1 piece of white foam removed. Used 18M skin barrier wipes on the surrounding skin. Placed nonaderent gauze around the wound where the skin is raw and blistering. Placed 2 piece of white foam in the wound and 1 piece black foam. 2 piece black foam used to bridge to the right of the wound. Immediate suction obtained at 125 mmHg. Patient tolerated the procedure well but needs to be premedicated. Sits on the side of the bed and leans on his walker while dressing is being changed.  Supplies in top drawer at bedside.    [place] following M/W/F   Cathlean Marse[place]. Tamala Julian, MSN, RN, [place], Lysle Pearl, [facility] Wound Treatment Associate Pager [phone]

## 2021-02-12 DIAGNOSIS — M26212 Malocclusion, Angle's class II: Secondary | ICD-10-CM

## 2021-02-12 DIAGNOSIS — M4646 Discitis, unspecified, lumbar region: Secondary | ICD-10-CM

## 2021-02-12 DIAGNOSIS — K6812 Psoas muscle abscess: Secondary | ICD-10-CM

## 2021-02-12 DIAGNOSIS — G062 Extradural and subdural abscess, unspecified: Secondary | ICD-10-CM

## 2021-02-12 LAB — GLUCOSE, CAPILLARY
Glucose-Capillary: 123 mg/dL — ABNORMAL HIGH (ref 70–99)
Glucose-Capillary: 139 mg/dL — ABNORMAL HIGH (ref 70–99)
Glucose-Capillary: 121 mg/dL — ABNORMAL HIGH (ref 70–99)
Glucose-Capillary: 141 mg/dL — ABNORMAL HIGH (ref 70–99)

## 2021-02-12 MED ORDER — MORPHINE SULFATE ER 15 MG PO TBCR
30.0000 mg | EXTENDED_RELEASE_TABLET | Freq: Two times a day (BID) | ORAL | Status: DC
Start: 2021-02-12 — End: 2021-02-14
  Administered 2021-02-12 – 2021-02-14 (×4): 30 mg via ORAL
  Filled 2021-02-12 (×4): qty 2

## 2021-02-12 MED ORDER — MORPHINE SULFATE ER 15 MG PO TBCR
15.0000 mg | EXTENDED_RELEASE_TABLET | Freq: Once | ORAL | Status: AC
  Administered 2021-02-12: 15 mg via ORAL
  Filled 2021-02-12: qty 1

## 2021-02-12 NOTE — Progress Notes (Signed)
Physical Therapy Session Note  Patient Details  Name: [name] MRN: [id number] Date of Birth: 1965/08/10  Today's Date: 02/12/2021 PT Individual Time: 0900-1000; 1445-1515 PT Individual Time Calculation (min): 60 min and 30 min  Short Term Goals: Week 2:  PT Short Term Goal 1 (Week 2): =LTG due to ELOS  Skilled Therapeutic Interventions/Progress Updates:    Session 1: Pt received seated in recliner in room, agreeable to PT session. Pt reports 8/10 pain in R lower back at rest, able to receive pain medication from nursing during session. Sit to stand with min A to RW throughout session, cues for anterior weight shift. Pt has increase in pain to 9/10 with sit to stand transfer and with mobility. Attempt to have pt perform w/c propulsion, reports it increases his back pain too much. Ambulation x 125 ft, x 105 ft with RW and CGA for balance. Pt exhibits improved endurance for gait training this date. Pt reports his LE feel like "spaghetti" following ambulation. Standing alt LE marches 2 x 10 reps with RW and CGA for LE strengthening. Pt request to return to bed due to fatigue and pain. Sit to supine mod A needed for BLE management. Pt left in L sidelying in bed with needs in reach. Pt missed 15 min of scheduled therapy session due to pain. Discussed adjusting pt's schedule to shorter therapy sessions due to decreased tolerance with scheduler.  Session 2: Pt received in L sidelying in bed, reports 7/10 pain in R lower back at rest. Pt agreeable with encouragement to participate in family education session with wife, [name]. Supine to sit with Supervision from flat bed with use of bedrail, increased time needed due to pain. Pt has onset of 10/10 pain in R lower back with position change. Assisted pt with applying muscle cream to site of pain, palpable trigger points. Performed TPR to region with minimal change in pain. Sit to stand with CGA to RW. Ambulation x 50 ft in room with RW and CGA for  balance. Educated pt's wife on current assist level, need to perform stairs prior to d/c home, equipment he will need upon d/c home, and how pain is most limiting factor for patient at this time. Provided home measurement sheet to wife to complete. Pt left seated EOB with needs in reach, wife present.  Therapy Documentation Precautions:  Precautions Precautions: Fall, Back, Other (comment) Precaution Booklet Issued: No Precaution Comments: wound vac, pt able to recall back precautions Required Braces or Orthoses: Other Brace Other Brace: no brace needed per order, d/t wound vac Restrictions Weight Bearing Restrictions: No General: PT Amount of Missed Time (min): 15 Minutes PT Missed Treatment Reason: Pain      Therapy/Group: Individual Therapy   Excell Seltzer, PT, DPT, CSRS  02/12/2021, 5:10 PM

## 2021-02-12 NOTE — Progress Notes (Signed)
[facility] PHYSICAL MEDICINE & REHABILITATION PROGRESS NOTE  Subjective/Complaints:  Appreciate ID/NSU.   Pain is just out of control- asking to increase pain meds.  Also didn't get Dilaudid for VAC change yesterday.   LBM yesterday- Hasn't had pain meds since early AM.    ROS:   Pt denies SOB, abd pain, CP, N/V/C/D, and vision changes   Objective: Vital Signs: Blood pressure 119/72, pulse 98, temperature 97.9 F (36.6 C), temperature source Oral, resp. rate 16, height _0  (1.88 m), weight 135.6 kg, SpO2 97 %. DG Abd 1 View  Result Date: 02/10/2021 CLINICAL DATA:  Nausea, vomiting, abdominal pain EXAM: ABDOMEN - 1 VIEW COMPARISON:  01/21/2021 FINDINGS: Examination is technically difficult due to patient's body habitus. As far as seen, there is no dilation of small bowel loops. Gas and stool are present in colon. Stomach is not distended. Laminectomy and fusion is seen in lumbar spine. There is soft tissue density in the right paraspinal region which may be an artifact or prominent psoas. In previous MRI done on 02/09/2021, right psoas was not enlarged. IMPRESSION: Nonspecific bowel gas pattern. Electronically Signed   By: Elmer Picker M.D.   On: 02/10/2021 14:24   Recent Labs    02/11/21 0038  WBC 6.1  HGB 7.5*  HCT 23.1*  PLT 234    Recent Labs    02/10/21 0345 02/11/21 0038  NA 133* 134*  K 4.1 3.9  CL 102 103  CO2 25 25  GLUCOSE 133* 121*  BUN 22* 13  CREATININE 0.91 0.86  CALCIUM 8.6* 8.6*    Intake/Output Summary (Last 24 hours) at 02/12/2021 0813 Last data filed at 02/12/2021 0451 Gross per 24 hour  Intake 480 ml  Output 3450 ml  Net -2970 ml        Physical Exam: BP 119/72 (BP Location: Left Arm)   Pulse 98   Temp 97.9 F (36.6 C) (Oral)   Resp 16   Ht _1  (1.88 m)   Wt 135.6 kg   SpO2 97%   BMI 38.38 kg/m      General: awake, alert, appropriate, sitting up at sink; OTA in room; NAD HENT: conjugate gaze; oropharynx moist CV:  regular rate; no JVD Pulmonary: CTA B/L; no W/R/R- good air movement GI: soft, NT, ND, (+)BS- protuberant  Psychiatric: appropriate- frustrated about pain Neurological: Ox3  Skin: Warm and dry. VAC in place- suctioning working/in place-  Musc: very TTP R low back -palpable trigger point- no change- nothing on L Neuro: Alert Motor: Bilateral upper extremities: 5/5 proximal distal LE- HF 4/5; KE/KF 4+/5; Df/PF 4+/5  Sensation intact   Assessment/Plan: 1. Functional deficits which require 3+ hours per day of interdisciplinary therapy in a comprehensive inpatient rehab setting. Physiatrist is providing close team supervision and 24 hour management of active medical problems listed below. Physiatrist and rehab team continue to assess barriers to discharge/monitor patient progress toward functional and medical goals   Care Tool:  Bathing    Body parts bathed by patient: Right arm, Left arm, Chest, Abdomen, Front perineal area, Right upper leg, Left upper leg, Face   Body parts bathed by helper: Buttocks, Right lower leg, Left lower leg     Bathing assist Assist Level: Minimal Assistance - Patient > 75%     Upper Body Dressing/Undressing Upper body dressing   What is the patient wearing?: Hospital gown only    Upper body assist Assist Level: Minimal Assistance - Patient > 75%  Lower Body Dressing/Undressing Lower body dressing      What is the patient wearing?: Pants     Lower body assist Assist for lower body dressing: Supervision/Verbal cueing     Toileting Toileting    Toileting assist Assist for toileting: Moderate Assistance - Patient 50 - 74%     Transfers Chair/bed transfer  Transfers assist  Chair/bed transfer activity did not occur: Safety/medical concerns  Chair/bed transfer assist level: Contact Guard/Touching assist     Locomotion Ambulation   Ambulation assist   Ambulation activity did not occur: Safety/medical concerns  Assist level:  Contact Guard/Touching assist Assistive device: Walker-rolling Max distance: 70 ft   Walk 10 feet activity   Assist  Walk 10 feet activity did not occur: Safety/medical concerns  Assist level: Contact Guard/Touching assist Assistive device: Walker-rolling   Walk 50 feet activity   Assist Walk 50 feet with 2 turns activity did not occur: Safety/medical concerns  Assist level: Contact Guard/Touching assist Assistive device: Walker-rolling    Walk 150 feet activity   Assist Walk 150 feet activity did not occur: Safety/medical concerns         Walk 10 feet on uneven surface  activity   Assist Walk 10 feet on uneven surfaces activity did not occur: Safety/medical concerns         Wheelchair     Assist Is the patient using a wheelchair?: Yes   Wheelchair activity did not occur: Safety/medical concerns         Wheelchair 50 feet with 2 turns activity    Assist    Wheelchair 50 feet with 2 turns activity did not occur: Safety/medical concerns       Wheelchair 150 feet activity     Assist  Wheelchair 150 feet activity did not occur: Safety/medical concerns        Medical Problem List and Plan: 1.  Lumbar radiculopathy/myelopathy secondary to nerve root compression- s/p decompression and wash out after bacteremia from klebsiella pneumonia Con't PT and OT- participating better- need to go home with VAC and IV ABX Con't PT and OT- 15/7- d/c scheduled 11/4- will determine at team conference? 2.  Impaired mobility: continue Lovenox  Resume lovenox given that pt not undergoing IR procedure  Dr Zada Finders to f/u             -antiplatelet therapy: N/A 3. Postoperative pain: Continue Oxycodone 15 mg prn. Resumed MSContin for more consistent pain relief. Scheduled Tizanidine TID. Resumed muscle rub to right thigh as it was helping before.  10/27- will add valium 5 mg nightly for muscle spasms  10/28- feels like pain a little better- reminded has IV  Dilaudid for VAC changes- will take  11/1- will increase MS Contin to 30 mg BID  Monitor with increased exertion 4. Mood: Team to provide ego support. LCSW to follow for evaluation and support.              -antipsychotic agents: N/A 5. Neuropsych: This patient is capable of making decisions on his own behalf. 6. Skin/Wound Care:  Monitor wound daily. Added vitamins and protein supplement to help promote wound healing.  7. Fluids/Electrolytes/Nutrition: Monitor I/Os 8. Klebsiella bacteremia w/diskitis: On Cefazolin 2 g every 8 hrs with end date 03/11/21 -- Check weekly CRP/ESR.   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 HTN: Monitor BP tid--continue Microzide and cozaar daily.  10/24- d/c HCTZ due to low Na  Monitor with increased mobility 10. T2DM with hyperglycemia: CM diet. On Metformin 1000 mg bid  10/26- BG's 140s-160s usually- con't regimen  10/29- somehow CBGs aren't being done- reordered  10/30- BG's 140s-160s usually- con't regimen  CBG (last 3)  Recent Labs    02/11/21 1139 02/11/21 2130 02/12/21 0641  GLUCAP 181* 151* 123*  11/1- controlled- con't regimen  11. Neurogenic bladder: with urinary retention On flomax. Increased to 0.8 mg nightly   10/24- still not voiding- will check U/A and Cx 10/26- no voiding- U/A (-)  11/1- will teach pt cathing, will d/w pt/wife 13.  Neuropathy: Continue Neurontin 600 mg TID.  14. ABLA: monitor for signs of bleeding. Hgb stabilizing to 8 range.  Hb 8.9 on 10/21  10/28- Hb down to 7.7- which might be lower, since kidney function is worse- will recheck in AM  10/29- Hb 7.5- likely from hemodilution and wound VAC- con't to recheck - Monday  10/30- might need blood transfusion Cont to monitor 15. 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 -- Follow up with surgery after discharge.  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  17. Abdominal pain: Much better--dysesthesias?  18. Hypomagnesemia: Resumed Mag Ox.   Mag 1.6 on 10/21, labs ordered for Monday  10/24- Mg 2.1  10/29- Mg 2.1- monitor 19. Hyponatremia  Na+ 132 on 10/21, labs ordered for Monday  10/24- Na down to 128- will do fluid restriction 1500cc- and stop HCTZ  10/25- Na 127- will recheck in AM after off HCTZ and on fluid restriction  10/26- Na up to 129- con't fluid restriction and recheck Friday  10/29- Na up to 131- recheck in AM 10/31 Na+ up to 134  20. Hypoalbuminemia  Supplement initiated on 10/21 21. Azotemia/ARI   10/31 resolved , off IVF  , cont to monitor weekly   22 Constipation  10/24- will give Sorbitol 66 and increase bowel meds- increase Senokot to 2 tabs BID.   10/29- Sorbitol 60cc- if doesn't work, give lactulose  11/1- LBM yesterday- con't regimen 23. Hypokalemia  10/28- will give KCL x2 40 mEq and recheck in AM- also checking Mg since can cause it to be low as well.  10/31 resolved K+ 3.9, off supplementation  24. N/V  10/30- will give antinausea medicine- KUB (-) except for constipation- has pooped 2x-  Will monitor and might need more cleaning out  11/1- resolved   LOS: 12 days A FACE TO FACE EVALUATION WAS PERFORMED  Regginald Pask 02/12/2021, 8:13 AM

## 2021-02-12 NOTE — Progress Notes (Signed)
Patient ID: [name], male   DOB: 1965/04/18, 55 y.o.   MRN: [id number]  After team meeting, pt was extended to 11/9.   SW spoke with Alexis/ [facility] [phone]: [phone]) to re-schedule appointment. Pt will only be able to be seen 2xs a week for wound vac. Appt scheduled for Monday, November 11 at 10:15am with Dr. Welton Flakes.   SW called pt wife to inform on updates from team conference, change in d/c date, and challenges with obtaining HH due to pt care needs. SW informed on scheduled appointment with wound care clinic. SW met with pt in room to inform on changes. Pt was in pain at time of visit, so struggled to be redirected due to pain. SW informed will continue to make efforts to get him a HHA and if not, pt will go to wound care clinic.   Loralee Pacas, MSW, [place] Office: [phone] Cell: [phone] Fax: [phone]

## 2021-02-12 NOTE — Progress Notes (Signed)
[facility] for Infectious Disease  Date of Admission:  01/31/2021      Total days of antibiotics 12   Cefazolin            ASSESSMENT: [name] is a 55 y.o. male with klebsiella pneumoniae surgical site infection with osteomyelitis/discitis L1-L2 on treatment with cefazolin. S/P Debridement with Dr. Reatha Armour on 10/14.   Worsening back pain up in CIR --> repeated MRI with small psoas muscle abscess (1cm) that appears new. Neurosurgery following - no surgical interventions at this time. Would not suspect acquired resistance with kleb pneumo (not an AMP-C producer). Continue with cefazolin IV. Agree with NSGY to repeat imaging in 2 weeks to reassess spine and psoas abscess --> any larger would ask IR to drain and culture.   Dispo = with pain limiting therapy unclear when his D/C would be.   Medication monitoring = continue weekly CBC and CMP for antibiotic safety monitoring at minimum; I don't think we need to continue checking the ESR/CRP twice a week - will probably be more useful to follow him clinically with symptoms.     PLAN: Continue cefazolin  FU MRI in 2 weeks recommended  Pain control per primary    Principal Problem:   Lumbar disc herniation with myelopathy Active Problems:   Hypoalbuminemia due to protein-calorie malnutrition (HCC)   Hyponatremia   Hypomagnesemia   Acute blood loss anemia   Postoperative pain    vitamin C  500 mg Oral BID   Chlorhexidine Gluconate Cloth  6 each Topical Daily   citalopram  20 mg Oral Daily   diazepam  5 mg Oral QHS   enoxaparin (LOVENOX) injection  40 mg Subcutaneous Q24H   gabapentin  600 mg Oral TID   [START ON 02/13/2021]  HYDROmorphone (DILAUDID) injection  1 mg Intravenous Q M,W,F   lactose free nutrition  237 mL Oral TID WC   losartan  100 mg Oral Daily   metFORMIN  1,000 mg Oral BID WC   morphine  30 mg Oral Q12H   Muscle Rub   Topical TID WC & HS   polyethylene glycol  17 g Oral Daily    rosuvastatin  20 mg Oral Daily   senna  2 tablet Oral BID   sorbitol  60 mL Oral Once   tamsulosin  0.8 mg Oral Daily   tiZANidine  4 mg Oral TID   zinc sulfate  220 mg Oral Daily     SUBJECTIVE: Says if we can get the pain under better control he would be more tolerable for therapy participation.  No changes in lower extrem neurovascular status or strength per his perception. Says he feels like he can point     Review of Systems: Review of Systems  Constitutional:  Negative for chills, fever, malaise/fatigue and weight loss.  HENT:  Negative for sore throat.        No dental problems  Respiratory:  Negative for cough and sputum production.   Cardiovascular:  Negative for chest pain and leg swelling.  Gastrointestinal:  Negative for abdominal pain, diarrhea and vomiting.  Genitourinary:  Negative for dysuria and flank pain.  Musculoskeletal:  Positive for back pain. Negative for joint pain, myalgias and neck pain.  Skin:  Negative for rash.  Neurological:  Negative for dizziness, tingling and headaches.  Psychiatric/Behavioral:  Negative for depression and substance abuse. The patient is not nervous/anxious and does not have insomnia.  Allergies  Allergen Reactions   Cymbalta [Duloxetine Hcl] Other (See Comments)   Penicillins Other (See Comments)    REACTION: Blister and Sores in the mouth    OBJECTIVE: Vitals:   02/11/21 0425 02/11/21 1522 02/11/21 2040 02/12/21 0347  BP: 137/76 (!) 158/94 130/79 119/72  Pulse: 99 (!) 108 (!) 106 98  Resp: '16 16 18 16  ' Temp: 98.1 F (36.7 C) 98.1 F (36.7 C) 98.6 F (37 C) 97.9 F (36.6 C)  TempSrc:  Oral Oral Oral  SpO2: 98% 98% 98% 97%  Weight:      Height:       Body mass index is 38.38 kg/m.  Physical Exam Vitals reviewed.  Constitutional:      Comments: Resting in bed. Appears uncomfortable.   HENT:     Mouth/Throat:     Mouth: No oral lesions.     Dentition: Normal dentition. No dental caries.  Eyes:      General: No scleral icterus. Cardiovascular:     Rate and Rhythm: Normal rate and regular rhythm.     Heart sounds: Normal heart sounds.  Pulmonary:     Effort: Pulmonary effort is normal.     Breath sounds: Normal breath sounds.  Abdominal:     General: There is no distension.     Palpations: Abdomen is soft.     Tenderness: There is no abdominal tenderness.  Lymphadenopathy:     Cervical: No cervical adenopathy.  Skin:    General: Skin is warm and dry.     Findings: No rash.  Neurological:     Mental Status: He is alert and oriented to person, place, and time.    Lab Results Lab Results  Component Value Date   WBC 6.1 02/11/2021   HGB 7.5 (L) 02/11/2021   HCT 23.1 (L) 02/11/2021   MCV 88.2 02/11/2021   PLT 234 02/11/2021    Lab Results  Component Value Date   CREATININE 0.86 02/11/2021   BUN 13 02/11/2021   NA 134 (L) 02/11/2021   K 3.9 02/11/2021   CL 103 02/11/2021   CO2 25 02/11/2021    Lab Results  Component Value Date   ALT 13 02/01/2021   AST 14 (L) 02/01/2021   ALKPHOS 84 02/01/2021   BILITOT 0.7 02/01/2021     Microbiology: Recent Results (from the past 240 hour(s))  Urine Culture     Status: None   Collection Time: 02/04/21  8:48 PM   Specimen: Urine, Catheterized  Result Value Ref Range Status   Specimen Description URINE, CATHETERIZED  Final   Special Requests NONE  Final   Culture   Final    NO GROWTH Performed at [facility], [address], [place], [place] [address]    Report Status 02/05/2021 FINAL  Final    Janene Madeira, MSN, NP-C [facility] for Infectious Disease [facility] Pager: [phone]  '@TODAY' @ 12:19 PM

## 2021-02-12 NOTE — Patient Care Conference (Signed)
Inpatient RehabilitationTeam Conference and Plan of Care Update Date: 02/12/2021   Time: 11:07 AM    Patient Name: [name]      Medical Record Number: [id number]  Date of Birth: [birth year] Sex: Male         Room/Bed: 4W16C/4W16C-01 Payor Info: Payor: Theme park manager / Plan: Theme park manager OTHER / Product Type: *No Product type* /    Admit Date/Time:  01/31/2021  3:41 PM  Primary Diagnosis:  Lumbar disc herniation with myelopathy  Hospital Problems: Principal Problem:   Lumbar disc herniation with myelopathy Active Problems:   Hypoalbuminemia due to protein-calorie malnutrition (HCC)   Hyponatremia   Hypomagnesemia   Acute blood loss anemia   Postoperative pain   Disto-occlusion   Lumbar discitis   Epidural abscess   Psoas abscess [facility])    Expected Discharge Date: Expected Discharge Date: 02/20/21  Team Members Present: Physician leading conference: Dr. Courtney Heys Social Worker Present: Loralee Pacas, [place] Nurse Present: Dorthula Nettles, RN PT Present: Excell Seltzer, PT OT Present: Roanna Epley, [place], OT PPS Coordinator present : Gunnar Fusi, SLP     Current Status/Progress Goal Weekly Team Focus  Bowel/Bladder   Continent of bowel. Inability to Urinate.  Regain Bladder continence  In/out cath.   Swallow/Nutrition/ Hydration             ADL's   functional transfers-CGA; UB bathing-supervision; LB bathing-pt declines; toileting-mod A; LB dressing-min A  supervision/mod I overall  OOB tolerance, BADL trasining, educaiton   Mobility   CGA supine to sit, mod A sit to supine (LE management), CGA to min A sit to stand, CGA transfers with RW, CGA gait up to 100 ft RW; VERY limited by pain  Supervision to mod I overall  pain management and participation in therapy sessions   Communication             Safety/Cognition/ Behavioral Observations            Pain   7/10 lower back  Pain <3  Assess Q4hr and prn   Skin   Wound vac to lower  back. 3 blisters on back.  No new skin breakdown.  Assess Qshift and PRN.     Discharge Planning:  D/c to home with his family. BIL will be with him during the day; wife home in the evening since she works during the day as a [employer] manager. Continued barriers with obtaining HH therapies for patient due to insurance and care needs (wound vac and IV abx for SN).   Team Discussion: Reports pain is out of control. Went up on MS Contin pain medication. Myofascial release helps some. Kidney function improving, I&O cathing  q 6hr. Please give IV dilaudid for wound vac changes, can be given at same time as other pain medications. Set up with wound care clinic in [place]. Will provide IV abx teaching. Patient on target to meet rehab goals: yes, still same functionally. Walked over 100 ft with contact guard assist and RW. Pain is limiting. Supervision to mod I goals.  *See Care Plan and progress notes for long and short-term goals.   Revisions to Treatment Plan:  Wound vac changes MWF, increased MS Contin.  Teaching Needs: Family education, transfers, skin/wound care, I&O caths, medication/pain management, safety awareness, IV abx.  Current Barriers to Discharge: Decreased caregiver support, Home enviroment access/layout, IV antibiotics, Neurogenic bowel and bladder, Wound care, Lack of/limited family support, Weight, Weight bearing restrictions, Medication compliance, and Behavior  Possible Resolutions to Barriers:  Family education Set up at wound care clinic in [place] IV abx education Supply HEP if indicated Order DME if indicated     Medical Summary Current Status: Kidney function finally doing better; stil on IV ABX,-for longer- still in/out caths- constipated; no bowel program; PICC in place?; wound VAC m/w/f- LBM 10/30-  Barriers to Discharge: Behavior;Decreased family/caregiver support;Home enviroment access/layout;IV antibiotics;Medical stability;Neurogenic Bowel & Bladder;Weight;Weight  bearing restrictions;Wound care;Other (comments)  Barriers to Discharge Comments: wound VAC change- working on getting plans for home to get changed; going home on PICC/IV ABX for >1 month; cannot find him H/H with VAC and IV ABX. per SW! pain still uncontrolled. wound care clinic in [place] was only place that will take him- still needs to learn cathing- q6 hours. Possible Resolutions to [employer]: focus- d/c planning; will extend due to all the medical issues; hasn't improved functionally in last 1 week- focus- pain and medical issues biggest limiter- also requires myofascial release- cannot inject/rigger point injections due to infection. increased MS contin 30 mg BID- move to 11/9   Continued Need for Acute Rehabilitation Level of Care: The patient requires daily medical management by a physician with specialized training in physical medicine and rehabilitation for the following reasons: Direction of a multidisciplinary physical rehabilitation program to maximize functional independence : Yes Medical management of patient stability for increased activity during participation in an intensive rehabilitation regime.: Yes Analysis of laboratory values and/or radiology reports with any subsequent need for medication adjustment and/or medical intervention. : Yes   I attest that I was present, lead the team conference, and concur with the assessment and plan of the team.   Cristi Loron 02/12/2021, 5:05 PM

## 2021-02-12 NOTE — Progress Notes (Signed)
Occupational Therapy Session Note  Patient Details  Name: KASEM MOZER MRN: [id number] Date of Birth: 1965-09-09  Today's Date: 02/12/2021 OT Individual Time: 0700-0809 OT Individual Time Calculation (min): 69 min    Short Term Goals: Week 2:  OT Short Term Goal 1 (Week 2): STG=LTG 2/2 ELOS (continue working towards supervision/mod I LTG)  Skilled Therapeutic Interventions/Progress Updates:    Pt resting in bed upon arrival and ready to get OOB and up to sink to wash up. After washing UB, pt requested to use toilet in bathroom. Pt amb in room and in/out of bathroom with RW at supervision level. Pt requires mod A for toileting tasks. LB dressing with min A. Myofascial Release on Rt LB for pain mgmt. Muscle Rub applied also. Minor relief noted but pt continues to report significant pain with all transitional movements. Pt amb with RW to recliner. Pt remained in recliner with all needs within reach.   Therapy Documentation Precautions:  Precautions Precautions: Fall, Back, Other (comment) Precaution Booklet Issued: No Precaution Comments: wound vac, pt able to recall back precautions Required Braces or Orthoses: Other Brace Other Brace: no brace needed per order, d/t wound vac Restrictions Weight Bearing Restrictions: No   Pain:  Pt c/o 7/10    Therapy/Group: Individual Therapy  [name] 02/12/2021, 8:10 AM

## 2021-02-13 LAB — GLUCOSE, CAPILLARY
Glucose-Capillary: 111 mg/dL — ABNORMAL HIGH (ref 70–99)
Glucose-Capillary: 127 mg/dL — ABNORMAL HIGH (ref 70–99)

## 2021-02-13 MED ORDER — LIDOCAINE 5 % EX PTCH
2.0000 | MEDICATED_PATCH | CUTANEOUS | Status: DC
  Administered 2021-02-13 – 2021-03-07 (×21): 2 via TRANSDERMAL
  Filled 2021-02-13 (×22): qty 2

## 2021-02-13 MED ORDER — CHLORHEXIDINE GLUCONATE CLOTH 2 % EX PADS
6.0000 | MEDICATED_PAD | Freq: Every day | CUTANEOUS | Status: DC
  Administered 2021-02-13 – 2021-03-07 (×23): 6 via TOPICAL

## 2021-02-13 NOTE — Consult Note (Signed)
[facility] Nurse wound follow up Patient receiving care in [facility] 4W16 Wound type: Surgical lumbar wound Wound bed: Seeing some shiny pink tissue this AM with more yellow fibrinous tissue scattered throughout. Since adding the white foam the bleeding with dressing removal has decreased a great amount.  Measurements: 20.5 x 7 x 4 proximally, 2.7 mid, and 5.5 at the distal end of the wound.  Drainage (amount, consistency, odor) Serosanguinous  in the canister. Periwound: still having issues with surrounding bulla which I have placed nonadherent dressings over prior to placing the drape.  Dressing procedure/placement/frequency: 3 pieces of black foam removed. Used 64M skin barrier wipes on the surrounding skin. 2 pieces of white foam used first in the wound then covered with 1 large piece of black foam replaced. Bridged over to the right of the wound using 1 piece of black foam. 2 pieces of black foam used all together. Drape applied, immediate suction obtained at 125 mmHg. Patient sits up on the side of the bed for the dressing change. Premedication needed prior to procedure.  Supplies at bedside in top drawer.  [facility] following M/W/F  Cathlean Marse[place]. Tamala Julian, MSN, RN, [place], Lysle Pearl, [facility] Wound Treatment Associate Pager [phone]

## 2021-02-13 NOTE — Progress Notes (Signed)
Occupational Therapy Session Note  Patient Details  Name: [name] MRN: [id number] Date of Birth: [birth year]  Today's Date: 02/13/2021 OT Missed Time: 61 Minutes (AM session) Missed Time Reason: Pain  OT Time Calculation: 1545-1610  (PM session) 25 min Missed 20 mins of OT 2/2 pain   Short Term Goals: Week 1:  OT Short Term Goal 1 (Week 1): Pt will don B shoes wiht MIN cuing for AE technique OT Short Term Goal 1 - Progress (Week 1): Progressing toward goal OT Short Term Goal 2 (Week 1): Pt will complete ambulatory toilet transfer with CGA consistently + LRAD. OT Short Term Goal 2 - Progress (Week 1): Met OT Short Term Goal 3 (Week 1): Pt will bathe wiht S OT Short Term Goal 3 - Progress (Week 1): Met Week 2:  OT Short Term Goal 1 (Week 2): STG=LTG 2/2 ELOS (continue working towards supervision/mod I LTG)   Skilled Therapeutic Interventions/Progress Updates:    Session 1: Pt greeted at time of session bed level just finishing having wound vac changed via wound care nurse. Pt stating pain too great to do activity/OOB activity at this time. Pt stating brother is not supposed to come for family ed until 1pm today despite schedule having family ed at 9am. Missed 45 mins of OT.  Session 2: Pt greeted at time of session partial side lying bed level, stating "just got his back to calm down" and did some walking/OOB activity with PT today. Pt politely but adamantly declining OOB/seated activity, ADL, out of room, etc. Pt agreeable to UB there ex, performed the following with 7# dowel for 2x12-15 reps: bicep curls, chest press, internal/external rotations, and forward circles with pt stating this was enough and back was beginning to feel uncomfortable. Pt also stating that brother is planning to be present for session tomorrow for family ed. Missed 20 mins of OT.  Therapy Documentation Precautions:  Precautions Precautions: Fall, Back, Other (comment) Precaution Booklet Issued:  No Precaution Comments: wound vac, pt able to recall back precautions Required Braces or Orthoses: Other Brace Other Brace: no brace needed per order, d/t wound vac Restrictions Weight Bearing Restrictions: No     Therapy/Group: Individual Therapy  Viona Gilmore 02/13/2021, 7:20 AM

## 2021-02-13 NOTE — Progress Notes (Signed)
Patient ID: [name], male   DOB: 11-May-1965, 55 y.o.   MRN: [id number]  SW ordered DME with [employer] [facility] via parachute- 24x18 w/c, and bariatric BSC and RW.   SW updated [facility] on change in pt d/c date. SW informed on challenges with obtaining HH.   [place] Amy/[place], MSW, [place] Office: [phone] Cell: [phone] Fax: [phone]

## 2021-02-13 NOTE — Progress Notes (Signed)
[facility] PHYSICAL MEDICINE & REHABILITATION PROGRESS NOTE  Subjective/Complaints:  Pt reports wants Dilaudid by 8am for wound VAC change- scared won't get it.  Cathing hurts- doesn't think can do it, because of discomfort- wants [name] to do.  R low back pain biggest issue, per pt.   Discussed cannot inject it/trigger point injections, due to infection, but will try Lidoderm patch.  Not more sleepy this AM from pain meds.   ROS:   Pt denies SOB, abd pain, CP, N/V/C/D, and vision changes    Objective: Vital Signs: Blood pressure (!) 149/79, pulse (!) 103, temperature 98.6 F (37 C), resp. rate 17, height '6\' 2"'  (1.88 m), weight 135.6 kg, SpO2 96 %. No results found. Recent Labs    02/11/21 0038  WBC 6.1  HGB 7.5*  HCT 23.1*  PLT 234    Recent Labs    02/11/21 0038  NA 134*  K 3.9  CL 103  CO2 25  GLUCOSE 121*  BUN 13  CREATININE 0.86  CALCIUM 8.6*    Intake/Output Summary (Last 24 hours) at 02/13/2021 0840 Last data filed at 02/13/2021 0830 Gross per 24 hour  Intake 360 ml  Output 2800 ml  Net -2440 ml        Physical Exam: BP (!) 149/79 (BP Location: Left Arm)   Pulse (!) 103   Temp 98.6 F (37 C)   Resp 17   Ht '6\' 2"'  (1.88 m)   Wt 135.6 kg   SpO2 96%   BMI 38.38 kg/m        General: awake, alert, appropriate, laying supine in bed- trying to eat; NAD HENT: conjugate gaze; oropharynx moist CV: regular rate; no JVD Pulmonary: CTA B/L; no W/R/R- good air movement GI: soft, NT, ND, (+)BS- protuberant Psychiatric: appropriate Neurological: Ox3; sitting up in bed- not sleepy from increase in pain meds today  Skin: Warm and dry. VAC in place- suctioning working/in place- no change Musc: still palpable/painful Neuro: Alert Motor: Bilateral upper extremities: 5/5 proximal distal LE- HF 4/5; KE/KF 4+/5; Df/PF 4+/5  Sensation intact   Assessment/Plan: 1. Functional deficits which require 3+ hours per day of interdisciplinary therapy in a  comprehensive inpatient rehab setting. Physiatrist is providing close team supervision and 24 hour management of active medical problems listed below. Physiatrist and rehab team continue to assess barriers to discharge/monitor patient progress toward functional and medical goals   Care Tool:  Bathing    Body parts bathed by patient: Right arm, Left arm, Chest, Abdomen, Front perineal area, Right upper leg, Left upper leg, Face   Body parts bathed by helper: Buttocks, Right lower leg, Left lower leg     Bathing assist Assist Level: Minimal Assistance - Patient > 75%     Upper Body Dressing/Undressing Upper body dressing   What is the patient wearing?: Hospital gown only    Upper body assist Assist Level: Minimal Assistance - Patient > 75%    Lower Body Dressing/Undressing Lower body dressing      What is the patient wearing?: Pants     Lower body assist Assist for lower body dressing: Supervision/Verbal cueing     Toileting Toileting    Toileting assist Assist for toileting: Moderate Assistance - Patient 50 - 74%     Transfers Chair/bed transfer  Transfers assist  Chair/bed transfer activity did not occur: Safety/medical concerns  Chair/bed transfer assist level: Contact Guard/Touching assist     Locomotion Ambulation   Ambulation assist   Ambulation activity  did not occur: Safety/medical concerns  Assist level: Contact Guard/Touching assist Assistive device: Walker-rolling Max distance: 70 ft   Walk 10 feet activity   Assist  Walk 10 feet activity did not occur: Safety/medical concerns  Assist level: Contact Guard/Touching assist Assistive device: Walker-rolling   Walk 50 feet activity   Assist Walk 50 feet with 2 turns activity did not occur: Safety/medical concerns  Assist level: Contact Guard/Touching assist Assistive device: Walker-rolling    Walk 150 feet activity   Assist Walk 150 feet activity did not occur: Safety/medical  concerns         Walk 10 feet on uneven surface  activity   Assist Walk 10 feet on uneven surfaces activity did not occur: Safety/medical concerns         Wheelchair     Assist Is the patient using a wheelchair?: Yes   Wheelchair activity did not occur: Safety/medical concerns         Wheelchair 50 feet with 2 turns activity    Assist    Wheelchair 50 feet with 2 turns activity did not occur: Safety/medical concerns       Wheelchair 150 feet activity     Assist  Wheelchair 150 feet activity did not occur: Safety/medical concerns        Medical Problem List and Plan: 1.  Lumbar radiculopathy/myelopathy secondary to nerve root compression- s/p decompression and wash out after bacteremia from klebsiella pneumonia Con't PT and OT- participating better- need to go home with VAC and IV ABX Con't PT and OT_ changed d/c date to 02/20/21 2.  Impaired mobility: continue Lovenox  Resume lovenox given that pt not undergoing IR procedure  Dr Zada Finders to f/u  11/2- will need after d/c for a total of 2 months from original surgery.              -antiplatelet therapy: N/A 3. Postoperative pain: Continue Oxycodone 15 mg prn. Resumed MSContin for more consistent pain relief. Scheduled Tizanidine TID. Resumed muscle rub to right thigh as it was helping before.  10/27- will add valium 5 mg nightly for muscle spasms  10/28- feels like pain a little better- reminded has IV Dilaudid for VAC changes- will take  11/1- will increase MS Contin to 30 mg BID  11/2- will also add lidoderm patches for R low back to soften up trigger points- con't other regimen- can't tell if better yet?  Monitor with increased exertion 4. Mood: Team to provide ego support. LCSW to follow for evaluation and support.              -antipsychotic agents: N/A 5. Neuropsych: This patient is capable of making decisions on his own behalf. 6. Skin/Wound Care:  Monitor wound daily. Added vitamins and  protein supplement to help promote wound healing.  7. Fluids/Electrolytes/Nutrition: Monitor I/Os 8. Klebsiella bacteremia w/diskitis: On Cefazolin 2 g every 8 hrs with end date 03/11/21 -- Check weekly CRP/ESR.   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 HTN: Monitor BP tid--continue Microzide and cozaar daily.  10/24- d/c HCTZ due to low Na  Monitor with increased mobility 10. T2DM with hyperglycemia: CM diet. On Metformin 1000 mg bid  10/26- BG's 140s-160s usually- con't regimen  10/29- somehow CBGs aren't being done- reordered  10/30- BG's 140s-160s usually- con't regimen  CBG (last 3)  Recent Labs    02/12/21 1624 02/12/21 2105 02/13/21 0621  GLUCAP 121* 141* 111*  11/1- controlled- con't regimen  11. Neurogenic bladder: with urinary retention On flomax. Increased to 0.8 mg nightly   10/24- still not voiding- will check U/A and Cx 10/26- no voiding- U/A (-)  11/2- pt refuses to learn to cath- since painful- has lidocaine jelly- will d/w wife.  13. Neuropathy: Continue Neurontin 600 mg TID.  14. ABLA: monitor for signs of bleeding. Hgb stabilizing to 8 range.  Hb 8.9 on 10/21  10/28- Hb down to 7.7- which might be lower, since kidney function is worse- will recheck in AM  10/29- Hb 7.5- likely from hemodilution  and wound VAC- con't to recheck - Monday  10/30- might need blood transfusion  11/2- will recheck tomorrow Cont to monitor 15. 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 -- Follow up with surgery after discharge.  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  17. Abdominal pain: Much better--dysesthesias?  18. Hypomagnesemia: Resumed Mag Ox.   Mag 1.6 on 10/21, labs ordered for Monday  10/24- Mg 2.1  10/29- Mg 2.1- monitor 19. Hyponatremia  Na+ 132 on 10/21, labs ordered for Monday  10/24- Na down to 128- will do fluid restriction 1500cc- and stop HCTZ  10/25- Na 127- will recheck in AM after off HCTZ and on fluid restriction  10/26- Na up to 129- con't fluid restriction and recheck Friday  10/29- Na up to 131- recheck in AM 10/31 Na+ up to 134  20. Hypoalbuminemia  Supplement initiated on 10/21 21. Azotemia/ARI   10/31 resolved , off IVF  , cont to monitor weekly   22 Constipation  10/24- will give Sorbitol 66 and increase bowel meds- increase Senokot to 2 tabs BID.   10/29- Sorbitol 60cc- if doesn't work, give lactulose  11/1- LBM yesterday- con't regimen 23. Hypokalemia  10/28- will give KCL x2 40 mEq and recheck in AM- also checking Mg since can cause it to be low as well.  10/31 resolved K+ 3.9, off supplementation  24. N/V  10/30- will give antinausea medicine- KUB (-) except for constipation- has pooped 2x-  Will monitor and might need more cleaning out  11/1- resolved   LOS: 13 days A FACE TO FACE EVALUATION WAS PERFORMED  [name] 02/13/2021, 8:40 AM

## 2021-02-13 NOTE — Progress Notes (Signed)
Physical Therapy Session Note  Patient Details  Name: [name] MRN: [id number] Date of Birth: January 31, 1966  Today's Date: 02/13/2021 PT Individual Time: 1015-1045 PT Individual Time Calculation (min): 30 min   Short Term Goals: Week 2:  PT Short Term Goal 1 (Week 2): =LTG due to ELOS  Skilled Therapeutic Interventions/Progress Updates:    Pt received sidelying in bed, agreeable with encouragement to participate in therapy session. Pt reports 8/10 pain in R lower back at rest. Supine to sit with Supervision with increased time, HOB elevated and use of bedrail. Assisted pt with applying muscle cream to painful region and performed trigger point release. Sit to stand with CGA to RW. Ambulation x 100 ft in patient room with RW and CGA. Pt reports significant increase in pain with mobility. Encouraged pt to sit up in chair and spend time out of bed, pt declines due to pain. Pt requests to lay back down due to pain. Sit to supine mod A needed for BLE management. Supine to sidelying with Supervision. Performed soft tissue massage and trigger point release to R lower back region. Pt reports minimal relief following manual therapy. Pt left in L sidelying in bed with needs in reach, requesting pain medication from nursing at end of session. Pt missed 15 min of scheduled therapy session due to pain.  Therapy Documentation Precautions:  Precautions Precautions: Fall, Back, Other (comment) Precaution Booklet Issued: No Precaution Comments: wound vac, pt able to recall back precautions Required Braces or Orthoses: Other Brace Other Brace: no brace needed per order, d/t wound vac Restrictions Weight Bearing Restrictions: No General: PT Amount of Missed Time (min): 15 Minutes PT Missed Treatment Reason: Pain      Therapy/Group: Individual Therapy   Excell Seltzer, PT, DPT, CSRS  02/13/2021, 5:40 PM

## 2021-02-14 LAB — CBC WITH DIFFERENTIAL/PLATELET
Abs Immature Granulocytes: 0.03 10*3/uL (ref 0.00–0.07)
Basophils Absolute: 0 10*3/uL (ref 0.0–0.1)
Basophils Relative: 0 %
Eosinophils Absolute: 0.4 10*3/uL (ref 0.0–0.5)
Eosinophils Relative: 7 %
HCT: 22.9 % — ABNORMAL LOW (ref 39.0–52.0)
Hemoglobin: 7.3 g/dL — ABNORMAL LOW (ref 13.0–17.0)
Immature Granulocytes: 1 %
Lymphocytes Relative: 10 %
Lymphs Abs: 0.6 10*3/uL — ABNORMAL LOW (ref 0.7–4.0)
MCH: 28.2 pg (ref 26.0–34.0)
MCHC: 31.9 g/dL (ref 30.0–36.0)
MCV: 88.4 fL (ref 80.0–100.0)
Monocytes Absolute: 0.6 10*3/uL (ref 0.1–1.0)
Monocytes Relative: 10 %
Neutro Abs: 4.5 10*3/uL (ref 1.7–7.7)
Neutrophils Relative %: 72 %
Platelets: 226 10*3/uL (ref 150–400)
RBC: 2.59 MIL/uL — ABNORMAL LOW (ref 4.22–5.81)
RDW: 14.6 % (ref 11.5–15.5)
WBC: 6.1 10*3/uL (ref 4.0–10.5)
nRBC: 0 % (ref 0.0–0.2)

## 2021-02-14 LAB — BASIC METABOLIC PANEL
Anion gap: 4 — ABNORMAL LOW (ref 5–15)
BUN: 9 mg/dL (ref 6–20)
CO2: 27 mmol/L (ref 22–32)
Calcium: 8.3 mg/dL — ABNORMAL LOW (ref 8.9–10.3)
Chloride: 103 mmol/L (ref 98–111)
Creatinine, Ser: 0.99 mg/dL (ref 0.61–1.24)
GFR, Estimated: >60 mL/min (ref >60)
Glucose, Bld: 131 mg/dL — ABNORMAL HIGH (ref 70–99)
Potassium: 4.5 mmol/L (ref 3.5–5.1)
Sodium: 134 mmol/L — ABNORMAL LOW (ref 135–145)

## 2021-02-14 LAB — GLUCOSE, CAPILLARY
Glucose-Capillary: 111 mg/dL — ABNORMAL HIGH (ref 70–99)
Glucose-Capillary: 150 mg/dL — ABNORMAL HIGH (ref 70–99)
Glucose-Capillary: 124 mg/dL — ABNORMAL HIGH (ref 70–99)
Glucose-Capillary: 99 mg/dL (ref 70–99)

## 2021-02-14 LAB — PREPARE RBC (CROSSMATCH)

## 2021-02-14 MED ORDER — SODIUM CHLORIDE 0.9% IV SOLUTION: Freq: Once | INTRAVENOUS | Status: AC

## 2021-02-14 MED ORDER — ZINC SULFATE 220 (50 ZN) MG PO CAPS
220.0000 mg | ORAL_CAPSULE | Freq: Every day | ORAL | Status: DC
  Administered 2021-02-15 – 2021-03-07 (×21): 220 mg via ORAL
  Filled 2021-02-14 (×21): qty 1

## 2021-02-14 MED ORDER — DIPHENHYDRAMINE HCL 25 MG PO CAPS
25.0000 mg | ORAL_CAPSULE | Freq: Once | ORAL | Status: AC
  Administered 2021-02-14: 25 mg via ORAL
  Filled 2021-02-14: qty 1

## 2021-02-14 MED ORDER — ZINC SULFATE 220 (50 ZN) MG PO CAPS: 220.0000 mg | ORAL_CAPSULE | Freq: Every day | ORAL | Status: DC

## 2021-02-14 MED ORDER — ROSUVASTATIN CALCIUM 20 MG PO TABS: 20.0000 mg | ORAL_TABLET | Freq: Every day | ORAL | Status: DC

## 2021-02-14 MED ORDER — ROSUVASTATIN CALCIUM 20 MG PO TABS
20.0000 mg | ORAL_TABLET | Freq: Every day | ORAL | Status: DC
  Administered 2021-02-15 – 2021-03-07 (×21): 20 mg via ORAL
  Filled 2021-02-14 (×21): qty 1

## 2021-02-14 MED ORDER — CITALOPRAM HYDROBROMIDE 20 MG PO TABS
20.0000 mg | ORAL_TABLET | Freq: Every day | ORAL | Status: DC
  Administered 2021-02-15 – 2021-03-07 (×21): 20 mg via ORAL
  Filled 2021-02-14 (×21): qty 1

## 2021-02-14 MED ORDER — CITALOPRAM HYDROBROMIDE 20 MG PO TABS: 20.0000 mg | ORAL_TABLET | ORAL | Status: DC

## 2021-02-14 MED ORDER — MORPHINE SULFATE ER 15 MG PO TBCR
30.0000 mg | EXTENDED_RELEASE_TABLET | Freq: Two times a day (BID) | ORAL | Status: DC
  Administered 2021-02-14 – 2021-03-08 (×44): 30 mg via ORAL
  Filled 2021-02-14 (×44): qty 2

## 2021-02-14 MED ORDER — SENNA 8.6 MG PO TABS
3.0000 | ORAL_TABLET | Freq: Two times a day (BID) | ORAL | Status: DC
  Administered 2021-02-14 – 2021-03-08 (×45): 25.8 mg via ORAL
  Filled 2021-02-14 (×44): qty 3

## 2021-02-14 MED ORDER — ACETAMINOPHEN 325 MG PO TABS
650.0000 mg | ORAL_TABLET | Freq: Once | ORAL | Status: AC
  Administered 2021-02-14: 650 mg via ORAL
  Filled 2021-02-14: qty 2

## 2021-02-14 MED ORDER — FUROSEMIDE 10 MG/ML IJ SOLN
20.0000 mg | Freq: Once | INTRAMUSCULAR | Status: AC
  Administered 2021-02-14: 20 mg via INTRAVENOUS
  Filled 2021-02-14: qty 2

## 2021-02-14 NOTE — Progress Notes (Signed)
Physical Therapy Session Note  Patient Details  Name: [name] MRN: [id number] Date of Birth: 03/17/1966  Today's Date: 02/14/2021 PT Individual Time: 0900-1000 PT Individual Time Calculation (min): 60 min   Short Term Goals: Week 2:  PT Short Term Goal 1 (Week 2): =LTG due to ELOS  Skilled Therapeutic Interventions/Progress Updates: Pt presented in bed agreeable to therapy. Session focused on car transfer with brother's vehicle. Pt performed supine to sit with CGA and use of bed features and increased time/effort. Pt donned shirt with set up. Performed Sit to stand with CGA and PTA assisted with pulling pants over hips. Performed ambulatory transfer to w/c with RW and CGA. Pt transported to main entrance to participate in car transfer. Due to pain required increased time as bumps in hallway and sidewalk caused significant increase in pain. Pt was able to perform car transfer to mini-van with CGA and increased time. Pt was able to manage BLE with increased effort with PTA advising brother-in-law that he would be able to assist if needed pending pain. Pt transported back to room in same manner as prior. Pt agreeable to remain in recliner and performed ambulatory transfer to recliner. Pt left in recliner at end of session with call bell within reach and brother-in-law present.      Therapy Documentation Precautions:  Precautions Precautions: Fall, Back, Other (comment) Precaution Booklet Issued: No Precaution Comments: wound vac, pt able to recall back precautions Required Braces or Orthoses: Other Brace Other Brace: no brace needed per order, d/t wound vac Restrictions Weight Bearing Restrictions: No General:   Vital Signs:  Pain: Pain Assessment Pain Score: 4  Mobility:   Locomotion :    Trunk/Postural Assessment :    Balance:   Exercises:   Other Treatments:      Therapy/Group: Individual Therapy  [name] 02/14/2021, 12:51 PM

## 2021-02-14 NOTE — Progress Notes (Signed)
[place] PHYSICAL MEDICINE & REHABILITATION PROGRESS NOTE  Subjective/Complaints:   Pt reports meds in AM make him sick- so many at once- asked if can be spaced out to help nausea- vomited yesterday AM because of it, per pt.  Also discussed he really needs to learn to cath- because wife might not be home to cath him all the time.   Also, he c/o more fatigue and heart rate is up even at rest- so likely due to drop in Hb.    Feels like lidocaine patches somewhat helpful last night, but not the increase in MS contin as much, actually-.  ROS:   Pt denies SOB, abd pain, CP, N/V/C/D, and vision changes   Objective: Vital Signs: Blood pressure (!) 141/80, pulse (!) 104, temperature 98.3 F (36.8 C), temperature source Oral, resp. rate 19, height 6' 2" (1.88 m), weight 135.6 kg, SpO2 98 %. No results found. Recent Labs    02/14/21 0400  WBC 6.1  HGB 7.3*  HCT 22.9*  PLT 226    Recent Labs    02/14/21 0400  NA 134*  K 4.5  CL 103  CO2 27  GLUCOSE 131*  BUN 9  CREATININE 0.99  CALCIUM 8.3*    Intake/Output Summary (Last 24 hours) at 02/14/2021 0851 Last data filed at 02/14/2021 0840 Gross per 24 hour  Intake 460 ml  Output 2650 ml  Net -2190 ml        Physical Exam: BP (!) 141/80 (BP Location: Left Arm)   Pulse (!) 104   Temp 98.3 F (36.8 C) (Oral)   Resp 19   Ht 6' 2" (1.88 m)   Wt 135.6 kg   SpO2 98%   BMI 38.38 kg/m         General: awake, alert, appropriate, laying in bed; NAD HENT: conjugate gaze; oropharynx moist CV: regular rate; no JVD Pulmonary: CTA B/L; no W/R/R- good air movement GI: soft, NT, ND, (+)BS; protuberant; hypoactive Psychiatric: appropriate but depressed- flat Neurological: Ox3 Weakness in legs- Skin: Warm and dry. VAC in place- suctioning working/in place- no change Musc: still palpable/painful on R low back- very large trigger point- about size of small plum Neuro: Alert Motor: Bilateral upper extremities: 5/5  proximal distal LE- HF 4/5; KE/KF 4+/5; Df/PF 4+/5  Sensation intact   Assessment/Plan: 1. Functional deficits which require 3+ hours per day of interdisciplinary therapy in a comprehensive inpatient rehab setting. Physiatrist is providing close team supervision and 24 hour management of active medical problems listed below. Physiatrist and rehab team continue to assess barriers to discharge/monitor patient progress toward functional and medical goals   Care Tool:  Bathing    Body parts bathed by patient: Right arm, Left arm, Chest, Abdomen, Front perineal area, Right upper leg, Left upper leg, Face   Body parts bathed by helper: Buttocks, Right lower leg, Left lower leg     Bathing assist Assist Level: Minimal Assistance - Patient > 75%     Upper Body Dressing/Undressing Upper body dressing   What is the patient wearing?: Hospital gown only    Upper body assist Assist Level: Minimal Assistance - Patient > 75%    Lower Body Dressing/Undressing Lower body dressing      What is the patient wearing?: Pants     Lower body assist Assist for lower body dressing: Supervision/Verbal cueing     Toileting Toileting    Toileting assist Assist for toileting: Moderate Assistance - Patient 50 - 74%  Transfers Chair/bed transfer  Transfers assist  Chair/bed transfer activity did not occur: Safety/medical concerns  Chair/bed transfer assist level: Contact Guard/Touching assist     Locomotion Ambulation   Ambulation assist   Ambulation activity did not occur: Safety/medical concerns  Assist level: Contact Guard/Touching assist Assistive device: Walker-rolling Max distance: 70 ft   Walk 10 feet activity   Assist  Walk 10 feet activity did not occur: Safety/medical concerns  Assist level: Contact Guard/Touching assist Assistive device: Walker-rolling   Walk 50 feet activity   Assist Walk 50 feet with 2 turns activity did not occur: Safety/medical  concerns  Assist level: Contact Guard/Touching assist Assistive device: Walker-rolling    Walk 150 feet activity   Assist Walk 150 feet activity did not occur: Safety/medical concerns         Walk 10 feet on uneven surface  activity   Assist Walk 10 feet on uneven surfaces activity did not occur: Safety/medical concerns         Wheelchair     Assist Is the patient using a wheelchair?: Yes   Wheelchair activity did not occur: Safety/medical concerns         Wheelchair 50 feet with 2 turns activity    Assist    Wheelchair 50 feet with 2 turns activity did not occur: Safety/medical concerns       Wheelchair 150 feet activity     Assist  Wheelchair 150 feet activity did not occur: Safety/medical concerns        Medical Problem List and Plan: 1.  Lumbar radiculopathy/myelopathy secondary to nerve root compression- s/p decompression and wash out after bacteremia from klebsiella pneumonia Con't PT and OT- participating better- need to go home with VAC and IV ABX Con't PT and OT_ changed d/c date to 02/20/21  Con't PT and OT_ have to do peer to peer review today for pt- since they feel he's ready to leave tomorrow- however pain is still an issue- impairing function, and I'm actively working on that- as well as needs to learn to cath- very constipated- and still working on that= also N/V that could be from meds, or something else. Also, looks like heading very fast towards a transfusion with symptomatic anemia. Pt is NOT ready for d/c tomorrow.  2.  Impaired mobility: continue Lovenox  Resume lovenox given that pt not undergoing IR procedure  Dr Zada Finders to f/u  11/2- will need after d/c for a total of 2 months from original surgery.              -antiplatelet therapy: N/A 3. Postoperative pain: Continue Oxycodone 15 mg prn. Resumed MSContin for more consistent pain relief. Scheduled Tizanidine TID. Resumed muscle rub to right thigh as it was helping  before.  10/27- will add valium 5 mg nightly for muscle spasms  10/28- feels like pain a little better- reminded has IV Dilaudid for VAC changes- will take  11/1- will increase MS Contin to 30 mg BID  11/3- lidocaine patches helpful- will go over risks/benefits of trigger point injections and let pt decide if wants them.   Monitor with increased exertion 4. Mood: Team to provide ego support. LCSW to follow for evaluation and support.              -antipsychotic agents: N/A 5. Neuropsych: This patient is capable of making decisions on his own behalf. 6. Skin/Wound Care:  Monitor wound daily. Added vitamins and protein supplement to help promote wound healing.  7. Fluids/Electrolytes/Nutrition: Monitor  I/Os 8. Klebsiella bacteremia w/diskitis: On Cefazolin 2 g every 8 hrs with end date 03/11/21 -- Check weekly CRP/ESR.   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 HTN: Monitor BP tid--continue Microzide and cozaar daily.  10/24- d/c HCTZ due to low Na  Monitor with increased mobility 10. T2DM with hyperglycemia: CM diet. On Metformin 1000 mg bid  10/26- BG's 140s-160s usually- con't regimen  10/29- somehow CBGs aren't being done- reordered  10/30- BG's 140s-160s usually- con't regimen  CBG (last 3)  Recent Labs     02/13/21 0621 02/13/21 1129 02/14/21 0638  GLUCAP 111* 127* 111*   11/3- BG's controlled- con't regimen 11. Neurogenic bladder: with urinary retention On flomax. Increased to 0.8 mg nightly   10/24- still not voiding- will check U/A and Cx 10/26- no voiding- U/A (-)  111/3- explained to pt he also needs to learn to cath- if wife isn't home-will ask nursing to teach.   13. Neuropathy: Continue Neurontin 600 mg TID.  14. ABLA: monitor for signs of bleeding. Hgb stabilizing to 8 range.  Hb 8.9 on 10/21  10/28- Hb down to 7.7- which might be lower, since kidney function is worse- will recheck in AM  10/29- Hb 7.5- likely from hemodilution and wound VAC- con't to recheck - Monday  11/3- pt Hb down to 7.3- pulse is 104- and symptomatic- will recheck in AM, but will recheck in AM- will likely need transfusion tomorrow or Saturday.  Cont to monitor 15. 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 -- Follow up with surgery after discharge.  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  17. Abdominal pain: Much better--dysesthesias?  18. Hypomagnesemia: Resumed Mag Ox.   Mag 1.6 on 10/21, labs ordered for Monday  10/24- Mg 2.1  10/29- Mg 2.1- monitor 19. Hyponatremia  Na+ 132 on 10/21, labs ordered for Monday  10/24- Na down to 128- will do fluid restriction 1500cc- and stop HCTZ  10/25- Na 127- will recheck in AM after off HCTZ and on fluid restriction  10/26- Na up to 129- con't fluid restriction and recheck Friday  10/29- Na up to 131- recheck in AM 10/31 Na+ up to 134  11/3- Na up to 134 20. Hypoalbuminemia  Supplement initiated on 10/21 21. Azotemia/ARI   10/31 resolved , off IVF  , cont to monitor weekly   22 Constipation  10/24- will give Sorbitol 66 and increase bowel meds- increase Senokot to 2 tabs BID.   10/29- Sorbitol 60cc- if doesn't work, give lactulose  11/1- LBM yesterday- con't regimen  11/3- if no BM today, need to clean out again- keep  increasing bowel meds 23. Hypokalemia  10/28-  will give KCL x2 40 mEq and recheck in AM- also checking Mg since can cause it to be low as well.  10/31 resolved K+ 3.9, off supplementation  24. N/V  10/30- will give antinausea medicine- KUB (-) except for constipation- has pooped 2x-  Will monitor and might need more cleaning out  11/3- will spread out AM meds to see if helps N/V.     I spent a total of 39 minutes on total care- >50% coordination of care- doing peer to peer as well as speaking with pharmacy and wife.    LOS: 14 days A FACE TO FACE EVALUATION WAS PERFORMED  Nicholos Aloisi 02/14/2021, 8:51 AM

## 2021-02-14 NOTE — Progress Notes (Signed)
Patient ID: [name], male   DOB: 1965-08-25, 55 y.o.   MRN: [id number]  Pt approved by insurance to stay through 11/9.  SW still waiting on updates from [facility] if able to accept referral.   SW updated Pam Chandler/[facility] on challenges as well, and will update once there is a HHA in place.   Loralee Pacas, MSW, [place] Office: [phone] Cell: [phone] Fax: [phone]

## 2021-02-14 NOTE — Progress Notes (Signed)
Physical Therapy Session Note  Patient Details  Name: [name] MRN: [id number] Date of Birth: 1966-04-05  Today's Date: 02/14/2021 PT Individual Time:  -      Short Term Goals: Week 2:  PT Short Term Goal 1 (Week 2): =LTG due to ELOS  Skilled Therapeutic Interventions/Progress Updates: Pt missed 30 min skilled PT due to pt. Upon PTA arrival pt in bed and expressed that due to back/back Family ed session pt with increased pain and fatigue.      Therapy Documentation Precautions:  Precautions Precautions: Fall, Back, Other (comment) Precaution Booklet Issued: No Precaution Comments: wound vac, pt able to recall back precautions Required Braces or Orthoses: Other Brace Other Brace: no brace needed per order, d/t wound vac Restrictions Weight Bearing Restrictions: No General: PT Amount of Missed Time (min): 30 Minutes PT Missed Treatment Reason: Pain;Patient fatigue Vital Signs: Therapy Vitals Temp: 98.4 F (36.9 C) Temp Source: Oral Pulse Rate: 98 Resp: 17 BP: 110/67 Patient Position (if appropriate): Lying Oxygen Therapy SpO2: 98 % O2 Device: Room Air Patient Activity (if Appropriate): In bed Pulse Oximetry Type: Intermittent  Therapy/Group: Individual Therapy  [name] 02/14/2021, 4:48 PM

## 2021-02-14 NOTE — Progress Notes (Signed)
Occupational Therapy Session Note  Patient Details  Name: [name] MRN: [id number] Date of Birth: November 24, 1965  Today's Date: 02/14/2021 OT Individual Time: 1010-1100 OT Individual Time Calculation (min): 50 min  and Today's Date: 02/14/2021 OT Missed Time: 10 Minutes Missed Time Reason: Other (comment) (previous pt care)   Short Term Goals: Week 1:  OT Short Term Goal 1 (Week 1): Pt will don B shoes wiht MIN cuing for AE technique OT Short Term Goal 1 - Progress (Week 1): Progressing toward goal OT Short Term Goal 2 (Week 1): Pt will complete ambulatory toilet transfer with CGA consistently + LRAD. OT Short Term Goal 2 - Progress (Week 1): Met OT Short Term Goal 3 (Week 1): Pt will bathe wiht S OT Short Term Goal 3 - Progress (Week 1): Met Week 2:  OT Short Term Goal 1 (Week 2): STG=LTG 2/2 ELOS (continue working towards supervision/mod I LTG)   Skilled Therapeutic Interventions/Progress Updates:    Pt greeted at time of session 10 minutes late 2/2 previous patient care. Pt and brother Rex present for family ed, pt up in chair. Pain tolerable at beginning of session, but at 10/10 pain at end of session and returned to bed which improved pain. Initial part of session spent on family education/training with brother regarding back precautions, compensatory techniques, back protection, and verbally reviewed assist needed for bathing and dressing as pt already dressed and declined bathing today. Visually and verbally reviewed with brother and verbalized agreement. Pt ambulating chair > bathroom with transfer to toilet with CGA, provided by brother. Reviewed with brother and completed education on managing wound vac, RW, and gait belt with verbalized/demonstrated understanding. After ambulating, pt insisting he must lay down 2/2 pain, sit > supine with brother assist to manage BLE. PA Pam present at this time and requesting orthostatic VS, BP as follows: in supine 123/75 with HR of 103, sitting  99/62 with HR of 115, and in standing 79/47 with HR of 124. Back in bed resting alarm on call bell in reach and wound vac plugged in to main power.   Therapy Documentation Precautions:  Precautions Precautions: Fall, Back, Other (comment) Precaution Booklet Issued: No Precaution Comments: wound vac, pt able to recall back precautions Required Braces or Orthoses: Other Brace Other Brace: no brace needed per order, d/t wound vac Restrictions Weight Bearing Restrictions: No    Therapy/Group: Individual Therapy  Viona Gilmore 02/14/2021, 7:21 AM

## 2021-02-15 LAB — BPAM RBC
Blood Product Expiration Date: 202211192359
Blood Product Expiration Date: 202211212359
ISSUE DATE / TIME: 202211031355
ISSUE DATE / TIME: 202211032348
Unit Type and Rh: 5100
Unit Type and Rh: 5100

## 2021-02-15 LAB — TYPE AND SCREEN
ABO/RH(D): O POS
Antibody Screen: NEGATIVE
Unit division: 0
Unit division: 0

## 2021-02-15 LAB — CBC
HCT: 26.4 % — ABNORMAL LOW (ref 39.0–52.0)
Hemoglobin: 8.5 g/dL — ABNORMAL LOW (ref 13.0–17.0)
MCH: 28.1 pg (ref 26.0–34.0)
MCHC: 32.2 g/dL (ref 30.0–36.0)
MCV: 87.1 fL (ref 80.0–100.0)
Platelets: 207 10*3/uL (ref 150–400)
RBC: 3.03 MIL/uL — ABNORMAL LOW (ref 4.22–5.81)
RDW: 14.8 % (ref 11.5–15.5)
WBC: 5.3 10*3/uL (ref 4.0–10.5)
nRBC: 0 % (ref 0.0–0.2)

## 2021-02-15 LAB — GLUCOSE, CAPILLARY
Glucose-Capillary: 85 mg/dL (ref 70–99)
Glucose-Capillary: 152 mg/dL — ABNORMAL HIGH (ref 70–99)
Glucose-Capillary: 119 mg/dL — ABNORMAL HIGH (ref 70–99)
Glucose-Capillary: 122 mg/dL — ABNORMAL HIGH (ref 70–99)

## 2021-02-15 MED ORDER — CEFAZOLIN IV (FOR PTA / DISCHARGE USE ONLY): 2.0000 g | Freq: Three times a day (TID) | INTRAVENOUS | 0 refills | Status: DC

## 2021-02-15 MED ORDER — SORBITOL 70 % SOLN
60.0000 mL | Freq: Once | Status: DC
  Filled 2021-02-15: qty 60

## 2021-02-15 MED ORDER — BENEPROTEIN PO POWD
1.0000 | Freq: Three times a day (TID) | ORAL | Status: DC
  Administered 2021-02-15 – 2021-02-28 (×4): 6 g via ORAL
  Filled 2021-02-15: qty 227

## 2021-02-15 NOTE — Progress Notes (Signed)
[facility] PHYSICAL MEDICINE & REHABILITATION PROGRESS NOTE  Subjective/Complaints:   Pt reports trigger point injections helped some- hasn't been OOB yet, but at least 20% or more.  No appetite- still hasn't had BM- not nauseated yet but worried about so many meds.  . Feels better s/p blood yesterday.   ROS:   Pt denies SOB, abd pain, CP, N/V/(+) C/D, and vision changes   Objective: Vital Signs: Blood pressure 132/78, pulse 84, temperature 97.6 F (36.4 C), temperature source Oral, resp. rate 18, height '6\' 2"'  (1.88 m), weight 135.6 kg, SpO2 99 %. No results found. Recent Labs    02/14/21 0400 02/15/21 0519  WBC 6.1 5.3  HGB 7.3* 8.5*  HCT 22.9* 26.4*  PLT 226 207    Recent Labs    02/14/21 0400  NA 134*  K 4.5  CL 103  CO2 27  GLUCOSE 131*  BUN 9  CREATININE 0.99  CALCIUM 8.3*    Intake/Output Summary (Last 24 hours) at 02/15/2021 0837 Last data filed at 02/15/2021 0550 Gross per 24 hour  Intake 2383.54 ml  Output 4450 ml  Net -2066.46 ml        Physical Exam: BP 132/78 (BP Location: Right Arm)   Pulse 84   Temp 97.6 F (36.4 C) (Oral)   Resp 18   Ht '6\' 2"'  (1.88 m)   Wt 135.6 kg   SpO2 99%   BMI 38.38 kg/m           General: awake, alert, appropriate, laying on side in bed; waiting for wound VAC change; NAD HENT: conjugate gaze; oropharynx moist CV: regular rate- not tachycardic today s/p blood; no JVD Pulmonary: CTA B/L; no W/R/R- good air movement BH:ALPFXTK, not hard; NT; distended; hypoactive BS Psychiatric: appropriate- less flat, less depressed Neurological: Ox3  Skin: Warm and dry. VAC in place- suctioning working/in place- no change Musc: still palpable/painful on R low back- very large trigger point- about size of small plum- is better after trigger point injections- less large, less TTP Neuro: Alert Motor: Bilateral upper extremities: 5/5 proximal distal LE- HF 4/5; KE/KF 4+/5; Df/PF 4+/5  Sensation  intact   Assessment/Plan: 1. Functional deficits which require 3+ hours per day of interdisciplinary therapy in a comprehensive inpatient rehab setting. Physiatrist is providing close team supervision and 24 hour management of active medical problems listed below. Physiatrist and rehab team continue to assess barriers to discharge/monitor patient progress toward functional and medical goals   Care Tool:  Bathing    Body parts bathed by patient: Right arm, Left arm, Chest, Abdomen, Front perineal area, Right upper leg, Left upper leg, Face   Body parts bathed by helper: Buttocks, Right lower leg, Left lower leg     Bathing assist Assist Level: Minimal Assistance - Patient > 75%     Upper Body Dressing/Undressing Upper body dressing   What is the patient wearing?: Hospital gown only    Upper body assist Assist Level: Minimal Assistance - Patient > 75%    Lower Body Dressing/Undressing Lower body dressing      What is the patient wearing?: Pants     Lower body assist Assist for lower body dressing: Supervision/Verbal cueing     Toileting Toileting    Toileting assist Assist for toileting: Moderate Assistance - Patient 50 - 74%     Transfers Chair/bed transfer  Transfers assist  Chair/bed transfer activity did not occur: Safety/medical concerns  Chair/bed transfer assist level: Contact Guard/Touching assist  Locomotion Ambulation   Ambulation assist   Ambulation activity did not occur: Safety/medical concerns  Assist level: Contact Guard/Touching assist Assistive device: Walker-rolling Max distance: 70 ft   Walk 10 feet activity   Assist  Walk 10 feet activity did not occur: Safety/medical concerns  Assist level: Contact Guard/Touching assist Assistive device: Walker-rolling   Walk 50 feet activity   Assist Walk 50 feet with 2 turns activity did not occur: Safety/medical concerns  Assist level: Contact Guard/Touching assist Assistive  device: Walker-rolling    Walk 150 feet activity   Assist Walk 150 feet activity did not occur: Safety/medical concerns         Walk 10 feet on uneven surface  activity   Assist Walk 10 feet on uneven surfaces activity did not occur: Safety/medical concerns         Wheelchair     Assist Is the patient using a wheelchair?: Yes   Wheelchair activity did not occur: Safety/medical concerns         Wheelchair 50 feet with 2 turns activity    Assist    Wheelchair 50 feet with 2 turns activity did not occur: Safety/medical concerns       Wheelchair 150 feet activity     Assist  Wheelchair 150 feet activity did not occur: Safety/medical concerns        Medical Problem List and Plan: 1.  Lumbar radiculopathy/myelopathy secondary to nerve root compression- s/p decompression and wash out after bacteremia from klebsiella pneumonia 11/4- got insurance approval til 11/9- con't PT and OT 2.  Impaired mobility: continue Lovenox  Resume lovenox given that pt not undergoing IR procedure  Dr Zada Finders to f/u  11/2- will need after d/c for a total of 2 months from original surgery.              -antiplatelet therapy: N/A 3. Postoperative pain: Continue Oxycodone 15 mg prn. Resumed MSContin for more consistent pain relief. Scheduled Tizanidine TID. Resumed muscle rub to right thigh as it was helping before.  10/27- will add valium 5 mg nightly for muscle spasms  10/28- feels like pain a little better- reminded has IV Dilaudid for VAC changes- will take  11/1- will increase MS Contin to 30 mg BID  11/3- lidocaine patches helpful- will go over risks/benefits of trigger point  11/4- did trigger point injections- helpful   Monitor with increased exertion 4. Mood: Team to provide ego support. LCSW to follow for evaluation and support.              -antipsychotic agents: N/A 5. Neuropsych: This patient is capable of making decisions on his own behalf. 6. Skin/Wound  Care:  Monitor wound daily. Added vitamins and protein supplement to help promote wound healing.  7. Fluids/Electrolytes/Nutrition: Monitor I/Os 8. Klebsiella bacteremia w/diskitis: On Cefazolin 2 g every 8 hrs with end date 03/11/21 -- Check weekly CRP/ESR.   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  9. HTN: Monitor BP tid--continue Microzide and cozaar daily.  10/24- d/c HCTZ due to low Na  Monitor with increased mobility 10. T2DM with hyperglycemia: CM diet. On Metformin 1000 mg bid  10/26- BG's 140s-160s usually- con't regimen  10/29- somehow CBGs aren't being done- reordered  10/30- BG's 140s-160s usually- con't regimen  CBG (last 3)  Recent Labs    02/14/21 1623 02/14/21 2101 02/15/21 0605  GLUCAP 124* 99 85   11/3- BG's controlled- con't regimen 11. Neurogenic bladder: with urinary retention On flomax. Increased to 0.8 mg nightly   10/24- still not voiding- will check U/A and Cx 10/26- no voiding- U/A (-)  111/3- explained to pt he also needs to learn to cath- if wife isn't home-will ask nursing to teach.   13. Neuropathy: Continue Neurontin 600 mg TID.  14. ABLA: monitor for signs of bleeding. Hgb stabilizing to 8 range.  Hb 8.9 on 10/21  10/28- Hb down to 7.7- which might be lower, since  kidney function is worse- will recheck in AM  10/29- Hb 7.5- likely from hemodilution and wound VAC- con't to recheck - Monday  11/3- pt Hb down to 7.3- pulse is 104- and symptomatic- will recheck in AM, but will recheck in AM- will likely need transfusion tomorrow or Saturday.   11/4- actually, had episode where BP dropped to 79/40s yesterday with standing and HR up to 124- transfused 1 unit- Hb back up to 8.5-  Cont to monitor 15. 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 -- Follow up with surgery after discharge.  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  17. Abdominal pain: Much better--dysesthesias?  18. Hypomagnesemia: Resumed Mag Ox.   Mag 1.6 on 10/21, labs ordered for Monday  10/24- Mg 2.1  10/29- Mg 2.1- monitor 19. Hyponatremia  Na+ 132 on 10/21, labs ordered for Monday  10/24- Na down to 128- will do fluid restriction 1500cc- and stop HCTZ  10/25- Na 127- will recheck in AM after off HCTZ and on fluid restriction  10/26- Na up to 129- con't fluid restriction and recheck Friday  10/29- Na up to 131- recheck in AM 10/31 Na+ up to 134  11/3- Na up to 134  11/4- will reduce fluid restriction to 2000 cc/day 20. Hypoalbuminemia  Supplement initiated on 10/21 21. Azotemia/ARI   10/31 resolved , off IVF  , cont to monitor weekly   22 Constipation  10/24- will give Sorbitol 66 and increase bowel meds- increase Senokot to 2 tabs BID.   10/29- Sorbitol 60cc- if doesn't work, give lactulose  11/1- LBM yesterday- con't regimen  11/3- if no BM today, need to clean out again- keep increasing bowel meds 23. Hypokalemia  10/28- will give KCL x2 40 mEq and recheck in AM- also checking Mg since can cause it to be low as well.  10/31 resolved K+ 3.9, off supplementation  24. N/V  10/30- will give antinausea medicine- KUB (-) except for constipation- has pooped 2x-  Will monitor and might need more cleaning out  11/3- will spread out AM meds to see if helps  N/V.    Patient here for trigger point injections for  Consent done and on chart.  Cleaned areas with alcohol and injected using a 27 gauge 1.5 inch needle  Injected 3cc Using 1% Lidocaine with no EPI  Upper traps Levators Posterior scalenes Middle scalenes Splenius Capitus Pectoralis Major Rhomboids Infraspinatus Teres Major/minor Thoracic paraspinals Lumbar paraspinals on R only- x4 spots Other injections-    I spent a total of 36 minutes on total care- >50% coordination of care doing trigger point injections and going over consent/risks/benefits.    LOS: 15 days A FACE TO FACE EVALUATION WAS PERFORMED  Sim Choquette 02/15/2021, 8:37 AM

## 2021-02-15 NOTE — Progress Notes (Signed)
Patient ID: [name], male   DOB: November 09, 1965, 55 y.o.   MRN: [id number]  SW still waiting on updates from Cory/[facility], [facility], and Stacie/[facility] if able to accept referral.   SW sent referral to Britney[facility] and waiting on follow-up.   SW waiting on follow-up from [facility] [phone]) who is waiting to hear from the branch.   Declined HHAs [facility]- unable to accept    Loralee Pacas, MSW, [place] Office: [phone] Cell: [phone] Fax: [phone]

## 2021-02-15 NOTE — Consult Note (Addendum)
[facility] Nurse wound follow up Patient receiving care in [facility] 4W16 Wound type: Surgical lumbar wound Wound bed: Seeing some shiny pink tissue this AM with less yellow fibrinous tissue scattered throughout. Since adding the white foam the bleeding with dressing removal has decreased a great amount. Still some blistering around the wound but seems to be improving with applying the non-adherent gauze on the areas.  Drainage (amount, consistency, odor) Sanguinous  in the canister. Canister changed today I was also asked to look at the patients sacral area where he is beginning to have some MASD with 2 small open areas that are pink and moist. These areas are on the right buttock and in the intergluteal cleft. Recommend a sacral foam dressing to be changed out every 3 days or PRN soiling.  Procedure/placement/frequency: 2 pieces of black foam removed. Used 12M skin barrier wipes on the surrounding skin. 2 pieces of white foam used first in the base of the wound then covered with 1 large piece of black foam. Bridged over to the right of the wound using 1 piece of black foam. 2 pieces of black foam used all together. Drape applied, immediate suction obtained at 125 mmHg. Patient sits up on the side of the bed for the dressing change. Premedication needed prior to procedure.  Supplies at bedside in top drawer.   [facility] following M/W/F   Cathlean Marseilles. Tamala Julian, MSN, RN, [place], Lysle Pearl, [facility] Wound Treatment Associate Pager [phone]

## 2021-02-15 NOTE — Progress Notes (Signed)
PHARMACY CONSULT NOTE FOR:  OUTPATIENT  PARENTERAL ANTIBIOTIC THERAPY (OPAT)  Indication: Klebsiella bacteremia/lumbar wound infection  Regimen: Cefazolin 2 gm IV Q 8 hours  End date: 03/11/2021  IV antibiotic discharge orders are pended. To discharging provider:  please sign these orders via discharge navigator,  Select New Orders & click on the button choice - Manage This Unsigned Work.     Thank you for allowing pharmacy to be a part of this patient's care.  Anette Guarneri, PharmD 02/15/2021, 10:27 AM

## 2021-02-15 NOTE — Progress Notes (Signed)
Occupational Therapy Session Note  Patient Details  Name: [name] MRN: [id number] Date of Birth: 01/27/1966  Today's Date: 02/15/2021 OT Individual Time: 1005-1045 OT Individual Time Calculation (min): 40 min    Short Term Goals: Week 1:  OT Short Term Goal 1 (Week 1): Pt will don B shoes wiht MIN cuing for AE technique OT Short Term Goal 1 - Progress (Week 1): Progressing toward goal OT Short Term Goal 2 (Week 1): Pt will complete ambulatory toilet transfer with CGA consistently + LRAD. OT Short Term Goal 2 - Progress (Week 1): Met OT Short Term Goal 3 (Week 1): Pt will bathe wiht S OT Short Term Goal 3 - Progress (Week 1): Met Week 2:  OT Short Term Goal 1 (Week 2): STG=LTG 2/2 ELOS (continue working towards supervision/mod I LTG)  Skilled Therapeutic Interventions/Progress Updates:  Patient met lying supine in bed reporting 10/10 pain in low back at rest. RN present to administer pain meds. Patient with request for OT to return in 15 minutes to allow time for pain meds to take effect. Upon return patient in agreement with participation after increased coaxing/encouragement. Supine to EOB with CGA and sit to stand from greatly elevated EOB to RW with CGA. Patient declined functional mobility to sink surface for grooming tasks opting for stand-pivot to wheelchair and wheelchair mobility to sink surface. Patient completed 2/3 grooming tasks seated at sink level and UB bathing/dressing with set-up assist. Continued reporting 10/10 pain with seated activity at sink level. Session concluded with patient seated in recliner with call bell within reach, chair alarm activated and all needs met.   Therapy Documentation Precautions:  Precautions Precautions: Fall, Back, Other (comment) Precaution Booklet Issued: No Precaution Comments: wound vac, pt able to recall back precautions Required Braces or Orthoses: Other Brace Other Brace: no brace needed per order, d/t wound  vac Restrictions Weight Bearing Restrictions: No General: General OT Amount of Missed Time: 20 Minutes Therapy/Group: Individual Therapy  [name] 02/15/2021, 9:54 AM

## 2021-02-15 NOTE — Progress Notes (Signed)
Physical Therapy Session Note  Patient Details  Name: [name] MRN: [id number] Date of Birth: [birth year]  Today's Date: 02/15/2021 PT Individual Time: 1405-1450 PT Individual Time Calculation (min): 45 min   Short Term Goals: Week 2:  PT Short Term Goal 1 (Week 2): =LTG due to ELOS  Skilled Therapeutic Interventions/Progress Updates: Pt presented in bed agreeable to therapy with encouragement. Pt c/o pain nsg notified, rest breaks provided as needed and session limited. Session focused on functional mobility with emphasis on stairs. Pt performed supine to sit with supervision and use of bed features. Performed ambulatory transfer to w/c with CGA and PTA managing lines. Pt transported to rehab gym for energy conservation. Pt was able to ascend/descend x 4 steps with B rails with close S and alternating step to and step through pattern. Pt did require time for recovery due to pain. Pt transported back to room and performed ambulatory transfer to bed with close supervision. Pt required minA for sit to supine. Pt remained sidelying with pillow placed at wound level for support. Pt left in bed at end of session with call bell within reach and needs met.      Therapy Documentation Precautions:  Precautions Precautions: Fall, Back, Other (comment) Precaution Booklet Issued: No Precaution Comments: wound vac, pt able to recall back precautions Required Braces or Orthoses: Other Brace Other Brace: no brace needed per order, d/t wound vac Restrictions Weight Bearing Restrictions: No General: PT Amount of Missed Time (min): 15 Minutes PT Missed Treatment Reason: Pain;Patient fatigue Vital Signs: Therapy Vitals Temp: 98.3 F (36.8 C) Pulse Rate: 97 Resp: 18 BP: (!) 143/80 Patient Position (if appropriate): Lying Oxygen Therapy O2 Device: Room Air Pain: Pain Assessment Pain Scale: 0-10 Pain Score: 10-Worst pain ever Pain Type: Acute pain Pain Location: Back Pain Orientation:  Mid Pain Descriptors / Indicators: Aching;Constant;Sharp Pain Frequency: Constant Pain Onset: On-going Patients Stated Pain Goal: 2 Pain Intervention(s): Medication (See eMAR) Multiple Pain Sites: No Mobility:   Locomotion :    Trunk/Postural Assessment :    Balance:   Exercises:   Other Treatments:      Therapy/Group: Individual Therapy  Lula Michaux 02/15/2021, 4:20 PM

## 2021-02-16 LAB — GLUCOSE, CAPILLARY
Glucose-Capillary: 105 mg/dL — ABNORMAL HIGH (ref 70–99)
Glucose-Capillary: 128 mg/dL — ABNORMAL HIGH (ref 70–99)
Glucose-Capillary: 113 mg/dL — ABNORMAL HIGH (ref 70–99)
Glucose-Capillary: 138 mg/dL — ABNORMAL HIGH (ref 70–99)

## 2021-02-16 NOTE — Progress Notes (Signed)
Occupational Therapy Session Note  Patient Details  Name: [name] MRN: [id number] Date of Birth: 04/19/1965  Today's Date: 02/16/2021 OT Individual Time: 0700-0809 OT Individual Time Calculation (min): 69 min    Short Term Goals: Week 3:  OT Short Term Goal 1 (Week 3): STG=LTG 2/2 ELOS (continue working towards supervision/mod I LTG)  Skilled Therapeutic Interventions/Progress Updates:    Pt resting in bed upon arrival. Pt agreeable to getting OOB for washing at sink. Pt declined use of bathroom this morning. Supine>sit EOB with supervision. Pt remained seated EOB to facilitate myofascial release to Rt lower back for pain mgmt. Pt reported relief following MFR. Sit<>stand and amb with RW in room with supervision and assistance with managing wound vac. Pt completed bathing at sink with setup. Sit<>stand from w/c with supervision. Pt requires rest breaks between transitional movements for pain mgmt. Pt returned to bed at request of IV nurse for dressing change. Sit>supine with assistance for managing BLE. Pt able to reposition without assistance. Pt remained in bed with all needs within reach and bed alarm activated.   Therapy Documentation Precautions:  Precautions Precautions: Fall, Back, Other (comment) Precaution Booklet Issued: No Precaution Comments: wound vac, pt able to recall back precautions Required Braces or Orthoses: Other Brace Other Brace: no brace needed per order, d/t wound vac Restrictions Weight Bearing Restrictions: No    Pain: Pt c/o 7/10 Rt lower back pain; myofascial release and muscle rub applied, repositioned Pt reports "this is the best I've felt in a long time"    Therapy/Group: Individual Therapy  [name] 02/16/2021, 8:11 AM

## 2021-02-16 NOTE — Progress Notes (Signed)
Physical Therapy Session Note  Patient Details  Name: [name] MRN: [id number] Date of Birth: 02-11-1966  Today's Date: 02/16/2021 PT Individual Time: 7322-5672 PT Individual Time Calculation (min): 54 min   Short Term Goals: Week 1:  PT Short Term Goal 1 (Week 1): Pt will tolerate OOB activity x 30 min PT Short Term Goal 1 - Progress (Week 1): Met PT Short Term Goal 2 (Week 1): Pt will perform standing transfers with consistent CGA. PT Short Term Goal 2 - Progress (Week 1): Met PT Short Term Goal 3 (Week 1): Pt will ambulate at least 30 feet consistently with CGA and LRAD. PT Short Term Goal 3 - Progress (Week 1): Met  Skilled Therapeutic Interventions/Progress Updates:  Pt was seen bedside in the am. Pt transferred supine to edge of bed with side rail and S with increased time. Pt tolerated edge of bed with S. Pt transferred sit to stand and stand with rolling walker and c/g with verbal cues. Pt ambulated 50 feet x 2, 10 feet, and 15 feet with rolling walker and c/g with verbal cues. Pt performed toilet transfers with c/g and verbal cues. Pt ascended/descended 4 stairs with B rails and c/g. Pt returned to room. Pt transferred edge of bed to supine with min A and verbal cues. Pt left sitting up in bed with all needs within reach and bed alarm on.   Therapy Documentation Precautions:  Precautions Precautions: Fall, Back, Other (comment) Precaution Booklet Issued: No Precaution Comments: wound vac, pt able to recall back precautions Required Braces or Orthoses: Other Brace Other Brace: no brace needed per order, d/t wound vac Restrictions Weight Bearing Restrictions: No General:    Pain: Pt c/o 6/10 back pain, medicated prior to treatment, pain increases to 10/10 pain with stairs.      Therapy/Group: Individual Therapy  Dub Amis 02/16/2021, 12:06 PM

## 2021-02-16 NOTE — Progress Notes (Signed)
Occupational Therapy Weekly Progress Note  Patient Details  Name: [name] MRN: [id number] Date of Birth: April 18, 1965  Beginning of progress report period: February 07, 2021 End of progress report period: February 16, 2021  Pt's planned d/c extended for medical management. Pt's pain has been improving with positive impact on pt's ability to participate in therapies. Pt requires min A for LB dressing/bathing. All functional transfers with supervision for management of wound vac with transfers and amb with RW. Toileing with min A. Pt continues to requires assistance with BLE management sit>supine but is superviison for supine>sit using bed functions.   Patient continues to demonstrate the following deficits: muscle weakness, decreased cardiorespiratoy endurance, and decreased standing balance and decreased postural control and therefore will continue to benefit from skilled OT intervention to enhance overall performance with BADL and Reduce care partner burden.  Patient progressing toward long term goals..  Continue plan of care.  OT Short Term Goals Week 2:  OT Short Term Goal 1 (Week 2): STG=LTG 2/2 ELOS (continue working towards supervision/mod I LTG) Week 3:  OT Short Term Goal 1 (Week 3): STG=LTG 2/2 ELOS (continue working towards supervision/mod I LTG)   [name] 02/16/2021, 7:56 AM

## 2021-02-16 NOTE — Progress Notes (Signed)
[place] PHYSICAL MEDICINE & REHABILITATION PROGRESS NOTE  Subjective/Complaints:  No issues overnite , no pain c/os . Transfused last week   ROS:   Pt denies SOB, abd pain, CP, N/V/(+) C/D, and vision changes   Objective: Vital Signs: Blood pressure (!) 141/78, pulse 93, temperature 98.1 F (36.7 C), temperature source Oral, resp. rate 16, height _0  (1.88 m), weight 135.6 kg, SpO2 98 %. No results found. Recent Labs    02/14/21 0400 02/15/21 0519  WBC 6.1 5.3  HGB 7.3* 8.5*  HCT 22.9* 26.4*  PLT 226 207     Recent Labs    02/14/21 0400  NA 134*  K 4.5  CL 103  CO2 27  GLUCOSE 131*  BUN 9  CREATININE 0.99  CALCIUM 8.3*     Intake/Output Summary (Last 24 hours) at 02/16/2021 1146 Last data filed at 02/16/2021 0930 Gross per 24 hour  Intake 480 ml  Output 3000 ml  Net -2520 ml         Physical Exam: BP (!) 141/78 (BP Location: Left Arm)   Pulse 93   Temp 98.1 F (36.7 C) (Oral)   Resp 16   Ht _1  (1.88 m)   Wt 135.6 kg   SpO2 98%   BMI 38.38 kg/m   General: No acute distress Mood and affect are appropriate Heart: Regular rate and rhythm no rubs murmurs or extra sounds Lungs: Clear to auscultation, breathing unlabored, no rales or wheezes Abdomen: Positive bowel sounds, soft nontender to palpation, nondistended Extremities: No clubbing, cyanosis, or edema Skin: No evidence of breakdown, no evidence of rash  Musculoskeletal: Full range of motion in all 4 extremities. No joint swelling   Skin: Warm and dry. VAC in place- suctioning working/in place- no change Musc: Neuro: Alert Motor: Bilateral upper extremities: 5/5 proximal distal LE- HF 4/5; KE/KF 4+/5; Df/PF 4+/5  Sensation intact   Assessment/Plan: 1. Functional deficits which require 3+ hours per day of interdisciplinary therapy in a comprehensive inpatient rehab setting. Physiatrist is providing close team supervision and 24 hour management of active medical problems listed  below. Physiatrist and rehab team continue to assess barriers to discharge/monitor patient progress toward functional and medical goals   Care Tool:  Bathing    Body parts bathed by patient: Right arm, Left arm, Chest, Abdomen, Front perineal area, Right upper leg, Left upper leg, Face   Body parts bathed by helper: Buttocks, Right lower leg, Left lower leg     Bathing assist Assist Level: Minimal Assistance - Patient > 75%     Upper Body Dressing/Undressing Upper body dressing   What is the patient wearing?: Hospital gown only    Upper body assist Assist Level: Minimal Assistance - Patient > 75%    Lower Body Dressing/Undressing Lower body dressing      What is the patient wearing?: Pants     Lower body assist Assist for lower body dressing: Supervision/Verbal cueing     Toileting Toileting    Toileting assist Assist for toileting: Moderate Assistance - Patient 50 - 74%     Transfers Chair/bed transfer  Transfers assist  Chair/bed transfer activity did not occur: Safety/medical concerns  Chair/bed transfer assist level: Contact Guard/Touching assist     Locomotion Ambulation   Ambulation assist   Ambulation activity did not occur: Safety/medical concerns  Assist level: Contact Guard/Touching assist Assistive device: Walker-rolling Max distance: 70 ft   Walk 10 feet activity   Assist  Walk 10 feet activity  did not occur: Safety/medical concerns  Assist level: Contact Guard/Touching assist Assistive device: Walker-rolling   Walk 50 feet activity   Assist Walk 50 feet with 2 turns activity did not occur: Safety/medical concerns  Assist level: Contact Guard/Touching assist Assistive device: Walker-rolling    Walk 150 feet activity   Assist Walk 150 feet activity did not occur: Safety/medical concerns         Walk 10 feet on uneven surface  activity   Assist Walk 10 feet on uneven surfaces activity did not occur: Safety/medical  concerns         Wheelchair     Assist Is the patient using a wheelchair?: Yes   Wheelchair activity did not occur: Safety/medical concerns         Wheelchair 50 feet with 2 turns activity    Assist    Wheelchair 50 feet with 2 turns activity did not occur: Safety/medical concerns       Wheelchair 150 feet activity     Assist  Wheelchair 150 feet activity did not occur: Safety/medical concerns        Medical Problem List and Plan: 1.  Lumbar radiculopathy/myelopathy secondary to nerve root compression- s/p decompression and wash out after bacteremia from klebsiella pneumonia 11/4- got insurance approval til 11/9- con't PT and OT 2.  Impaired mobility: continue Lovenox  Resume lovenox given that pt not undergoing IR procedure  Dr Zada Finders to f/u  11/2- will need after d/c for a total of 2 months from original surgery.              -antiplatelet therapy: N/A 3. Postoperative pain: Continue Oxycodone 15 mg prn. Resumed MSContin for more consistent pain relief. Scheduled Tizanidine TID. Resumed muscle rub to right thigh as it was helping before.  10/27- will add valium 5 mg nightly for muscle spasms  10/28- feels like pain a little better- reminded has IV Dilaudid for VAC changes- will take  11/1- will increase MS Contin to 30 mg BID  11/3- lidocaine patches helpful- will go over risks/benefits of trigger point  11/4- did trigger point injections- helpful   Monitor with increased exertion 4. Mood: Team to provide ego support. LCSW to follow for evaluation and support.              -antipsychotic agents: N/A 5. Neuropsych: This patient is capable of making decisions on his own behalf. 6. Skin/Wound Care:  Monitor wound daily. Added vitamins and protein supplement to help promote wound healing.  7. Fluids/Electrolytes/Nutrition: Monitor I/Os 8. Klebsiella bacteremia w/diskitis: On Cefazolin 2 g every 8 hrs with end date 03/11/21 -- Check weekly CRP/ESR.    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  9. HTN: Monitor BP tid--continue Microzide and cozaar daily.  10/24- d/c HCTZ due to low Na  Monitor with increased mobility 10. T2DM with hyperglycemia: CM diet. On Metformin 1000 mg bid  10/26- BG's 140s-160s usually- con't regimen  10/29- somehow CBGs aren't being done- reordered  10/30- BG's 140s-160s usually- con't regimen  CBG (last 3)  Recent Labs    02/15/21 1629 02/15/21 2145 02/16/21 0601  GLUCAP 119* 122* 105*    11/5 controlled  11. Neurogenic bladder: with urinary retention On flomax. Increased to 0.8 mg nightly   10/24- still not voiding- will check U/A and Cx 10/26- no voiding- U/A (-)  111/3- explained to pt he also needs to learn to cath- if wife isn't home-will ask nursing to teach.   13. Neuropathy: Continue Neurontin 600 mg TID.  14. ABLA: monitor for signs of bleeding. Hgb stabilizing to 8 range.  Hb 8.9 on 10/21  10/28- Hb down to 7.7- which might be lower, since kidney function is worse- will recheck in AM  10/29- Hb 7.5- likely from hemodilution and wound VAC- con't to recheck - Monday  11/3- pt Hb down to 7.3- pulse is 104- and symptomatic- will recheck in AM, but will recheck in AM- will likely need transfusion tomorrow or Saturday.    11/4- actually, had episode where BP dropped to 79/40s yesterday with standing and HR up to 124- transfused 1 unit- Hb back up to 8.5-  Cont to monitor 15. 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 -- Follow up with surgery after discharge.  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  17. Abdominal pain: Much better--dysesthesias?  18. Hypomagnesemia: Resumed Mag Ox.   Mag 1.6 on 10/21, labs ordered for Monday  10/24- Mg 2.1  10/29- Mg 2.1- monitor 19. Hyponatremia  Na+ 132 on 10/21, labs ordered for Monday  10/24- Na down to 128- will do fluid restriction 1500cc- and stop HCTZ  10/25- Na 127- will recheck in AM after off HCTZ and on fluid restriction  10/26- Na up to 129- con't fluid restriction and recheck Friday  10/29- Na up to 131- recheck in AM 10/31 Na+ up to 134  11/3- Na up to 134  11/4- will reduce fluid restriction to 2000 cc/day 20. Hypoalbuminemia  Supplement initiated on 10/21 21. Azotemia/ARI   10/31 resolved , off IVF  , cont to monitor weekly   22 Constipation  10/24- will give Sorbitol 66 and increase bowel meds- increase Senokot to 2 tabs BID.   10/29- Sorbitol 60cc- if doesn't work, give lactulose  11/1- LBM yesterday- con't regimen  11/3- if no BM today, need to clean out again- keep increasing bowel meds 23. Hypokalemia  10/28- will give KCL x2 40 mEq and recheck in AM- also checking Mg since can cause it to be low as well.  10/31 resolved K+ 3.9, off supplementation  24. N/V  10/30- will give antinausea medicine- KUB (-) except for constipation- has pooped 2x-  Will monitor and might need more cleaning out  11/3- will spread out AM meds to see if helps N/V.    LOS: 16 days A FACE TO Alder E Henya Aguallo 02/16/2021, 11:46 AM

## 2021-02-17 LAB — GLUCOSE, CAPILLARY
Glucose-Capillary: 125 mg/dL — ABNORMAL HIGH (ref 70–99)
Glucose-Capillary: 125 mg/dL — ABNORMAL HIGH (ref 70–99)
Glucose-Capillary: 143 mg/dL — ABNORMAL HIGH (ref 70–99)
Glucose-Capillary: 108 mg/dL — ABNORMAL HIGH (ref 70–99)

## 2021-02-17 NOTE — Progress Notes (Signed)
Occupational Therapy Session Note  Patient Details  Name: [name] MRN: [id number] Date of Birth: June 28, 1965  Today's Date: 02/17/2021 OT Individual Time: 2836-6294 OT Individual Time Calculation (min): 45 min    Short Term Goals: Week 3:  OT Short Term Goal 1 (Week 3): STG=LTG 2/2 ELOS (continue working towards supervision/mod I LTG)  Skilled Therapeutic Interventions/Progress Updates:    Patient in bed, alert and ready for therapy session.   He notes pain in right lower back 8/10.   Assisted with muscle rub cream and repositioning.   Supine to sitting edge of bed with CS and assist to elevate bed and HOB, he heavily relys on rails for sitting up.  Sit to stand and short distance ambulation with RW to/from bed and elevated seat on w/c with CS/CGA and assist to manage iv pole.  He is able to complete upper body bathing mod I at sink, min A to manage hospital gown.  Oral care mod I.  He declined changing shorts this session, but was willing to practice doff/donn of slipper socks with assistive devices - after initial instruction was able to doff with reacher min A and donn with sock aide min A.  Sit to stand and short distance ambulation w/ RW to recliner with built up elevation using w/c cushion and pillows CS/CGA.  He remained seated in recliner at close of session declining further activity and missing last 15 minutes due to ongoing pain.  Call bell and tray table in reach.    Therapy Documentation Precautions:  Precautions Precautions: Fall, Back, Other (comment) Precaution Booklet Issued: No Precaution Comments: wound vac, pt able to recall back precautions Required Braces or Orthoses: Other Brace Other Brace: no brace needed per order, d/t wound vac Restrictions Weight Bearing Restrictions: No   Therapy/Group: Individual Therapy  Carlos Levering 02/17/2021, 7:27 AM

## 2021-02-17 NOTE — Progress Notes (Signed)
[facility] PHYSICAL MEDICINE & REHABILITATION PROGRESS NOTE  Subjective/Complaints:  "Shots in the right rear quarter panel helped"  DIscussed that they may be repeated by Dr lovorn in clinic as well.  Discussed tachycardia, pt feels this improves when pain is controlled   ROS:   Pt denies SOB, abd pain, CP, N/V/(+) C/D, and vision changes   Objective: Vital Signs: Blood pressure (!) 153/86, pulse (!) 103, temperature 98.1 F (36.7 C), temperature source Oral, resp. rate 16, height _0  (1.88 m), weight 135.6 kg, SpO2 97 %. No results found. Recent Labs    02/15/21 0519  WBC 5.3  HGB 8.5*  HCT 26.4*  PLT 207     No results for input(s): NA, K, CL, CO2, GLUCOSE, BUN, CREATININE, CALCIUM in the last 72 hours.   Intake/Output Summary (Last 24 hours) at 02/17/2021 0931 Last data filed at 02/17/2021 0400 Gross per 24 hour  Intake 240 ml  Output 1800 ml  Net -1560 ml         Physical Exam: BP (!) 153/86 (BP Location: Left Arm)   Pulse (!) 103   Temp 98.1 F (36.7 C) (Oral)   Resp 16   Ht _1  (1.88 m)   Wt 135.6 kg   SpO2 97%   BMI 38.38 kg/m    General: No acute distress Mood and affect are appropriate Heart: Regular rate and rhythm no rubs murmurs or extra sounds Lungs: Clear to auscultation, breathing unlabored, no rales or wheezes Abdomen: Positive bowel sounds, soft nontender to palpation, nondistended Extremities: No clubbing, cyanosis, or edema Skin: No evidence of breakdown, no evidence of rash   Musculoskeletal: Full range of motion in all 4 extremities. No joint swelling   Skin: Warm and dry. VAC in place- suctioning working/in place- no change Musc: Neuro: Alert Motor: Bilateral upper extremities: 5/5 proximal distal LE- HF 4/5; KE/KF 4+/5; Df/PF 4+/5  Sensation intact   Assessment/Plan: 1. Functional deficits which require 3+ hours per day of interdisciplinary therapy in a comprehensive inpatient rehab setting. Physiatrist is providing  close team supervision and 24 hour management of active medical problems listed below. Physiatrist and rehab team continue to assess barriers to discharge/monitor patient progress toward functional and medical goals   Care Tool:  Bathing    Body parts bathed by patient: Right arm, Left arm, Chest, Abdomen, Front perineal area, Right upper leg, Left upper leg, Face   Body parts bathed by helper: Buttocks, Right lower leg, Left lower leg     Bathing assist Assist Level: Minimal Assistance - Patient > 75%     Upper Body Dressing/Undressing Upper body dressing   What is the patient wearing?: Hospital gown only    Upper body assist Assist Level: Minimal Assistance - Patient > 75%    Lower Body Dressing/Undressing Lower body dressing      What is the patient wearing?: Pants     Lower body assist Assist for lower body dressing: Supervision/Verbal cueing     Toileting Toileting    Toileting assist Assist for toileting: Moderate Assistance - Patient 50 - 74%     Transfers Chair/bed transfer  Transfers assist  Chair/bed transfer activity did not occur: Safety/medical concerns  Chair/bed transfer assist level: Contact Guard/Touching assist     Locomotion Ambulation   Ambulation assist   Ambulation activity did not occur: Safety/medical concerns  Assist level: Contact Guard/Touching assist Assistive device: Walker-rolling Max distance: 50   Walk 10 feet activity   Assist  Walk  10 feet activity did not occur: Safety/medical concerns  Assist level: Contact Guard/Touching assist Assistive device: Walker-rolling   Walk 50 feet activity   Assist Walk 50 feet with 2 turns activity did not occur: Safety/medical concerns  Assist level: Contact Guard/Touching assist Assistive device: Walker-rolling    Walk 150 feet activity   Assist Walk 150 feet activity did not occur: Safety/medical concerns         Walk 10 feet on uneven surface   activity   Assist Walk 10 feet on uneven surfaces activity did not occur: Safety/medical concerns         Wheelchair     Assist Is the patient using a wheelchair?: Yes   Wheelchair activity did not occur: Safety/medical concerns         Wheelchair 50 feet with 2 turns activity    Assist    Wheelchair 50 feet with 2 turns activity did not occur: Safety/medical concerns       Wheelchair 150 feet activity     Assist  Wheelchair 150 feet activity did not occur: Safety/medical concerns        Medical Problem List and Plan: 1.  Lumbar radiculopathy/myelopathy secondary to nerve root compression- s/p decompression and wash out after bacteremia from klebsiella pneumonia 11/4- got insurance approval til 11/9- con't PT and OT 2.  Impaired mobility: continue Lovenox  Resume lovenox given that pt not undergoing IR procedure  Dr Zada Finders to f/u  11/2- will need after d/c for a total of 2 months from original surgery.              -antiplatelet therapy: N/A 3. Postoperative pain: Continue Oxycodone 15 mg prn. Resumed MSContin for more consistent pain relief. Scheduled Tizanidine TID. Resumed muscle rub to right thigh as it was helping before.  10/27- will add valium 5 mg nightly for muscle spasms  10/28- feels like pain a little better- reminded has IV Dilaudid for VAC changes- will take  11/1- will increase MS Contin to 30 mg BID  11/3- lidocaine patches helpful- will go over risks/benefits of trigger point  11/4- did trigger point injections- helpful   Monitor with increased exertion 4. Mood: Team to provide ego support. LCSW to follow for evaluation and support.              -antipsychotic agents: N/A 5. Neuropsych: This patient is capable of making decisions on his own behalf. 6. Skin/Wound Care:  Monitor wound daily. Added vitamins and protein supplement to help promote wound healing.  7. Fluids/Electrolytes/Nutrition: Monitor I/Os 8. Klebsiella bacteremia  w/diskitis: On Cefazolin 2 g every 8 hrs with end date 03/11/21 -- Check weekly CRP/ESR.   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  9. HTN: Monitor BP tid--continue Microzide and cozaar daily.  10/24- d/c HCTZ due to low Na   Vitals:   02/16/21 1914 02/17/21 0629  BP: 127/73 (!) 153/86  Pulse: (!) 110 (!) 103  Resp: 16 16  Temp: 98.1 F (36.7 C) 98.1 F (36.7 C)  SpO2: 95% 97%   Mild tachy with BPs in normal to elevated range 10. T2DM with hyperglycemia: CM diet. On Metformin 1000 mg bid  10/26- BG's 140s-160s usually- con't regimen  10/29- somehow CBGs aren't being done- reordered  10/30- BG's 140s-160s usually- con't regimen  CBG (last 3)  Recent Labs    02/16/21 1646 02/16/21 2045 02/17/21 0626  GLUCAP 113* 138* 125*    11/6 controlled  11. Neurogenic bladder: with urinary retention On flomax. Increased to 0.8 mg nightly   10/24- still not voiding- will check U/A and Cx 10/26- no voiding- U/A (-)  111/3- explained to pt he also needs to learn to cath- if wife isn't home-will ask nursing to teach.   13. Neuropathy: Continue Neurontin 600 mg TID.  14. ABLA: monitor for signs of bleeding. Hgb stabilizing to 8 range.  Hb 8.9 on 10/21  10/28- Hb down to 7.7- which might be lower, since kidney  function is worse- will recheck in AM  10/29- Hb 7.5- likely from hemodilution and wound VAC- con't to recheck - Monday  11/3- pt Hb down to 7.3- pulse is 104- and symptomatic- will recheck in AM, but will recheck in AM- will likely need transfusion tomorrow or Saturday.   11/4- actually, had episode where BP dropped to 79/40s yesterday with standing and HR up to 124- transfused 1 unit- Hb back up to 8.5-  Repeat Hgb in am 11/6 Cont to monitor 15. 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 -- Follow up with surgery after discharge.  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  17. Abdominal pain: Much better--dysesthesias?  18. Hypomagnesemia: Resumed Mag Ox.   Mag 1.6 on 10/21, labs ordered for Monday  10/24- Mg 2.1  10/29- Mg 2.1- monitor 19. Hyponatremia  Na+ 132 on 10/21, labs ordered for Monday  10/24- Na down to 128- will do fluid restriction 1500cc- and stop HCTZ  10/25- Na 127- will recheck in AM after off HCTZ and on fluid restriction  10/26- Na up to 129- con't fluid restriction and recheck Friday  10/29- Na up to 131- recheck in AM 10/31 Na+ up to 134  11/3- Na up to 134  11/4- will reduce fluid restriction to 2000 cc/day 20. Hypoalbuminemia  Supplement initiated on 10/21 21. Azotemia/ARI   10/31 resolved , off IVF  , cont to monitor weekly   22 Constipation  10/24- will give Sorbitol 66 and increase bowel meds- increase Senokot to 2 tabs BID.   10/29- Sorbitol 60cc- if doesn't work, give lactulose  11/1- LBM yesterday- con't regimen  11/3- if no BM today, need to clean out again- keep increasing bowel meds 23. Hypokalemia  10/28- will give KCL x2 40 mEq and recheck in AM- also checking Mg since can cause it to be low as well.  10/31 resolved K+ 3.9, off supplementation , repeat BMET in am  24. N/V- improved 11/6  10/30- will give antinausea medicine- KUB (-) except for constipation- has pooped 2x-  Will monitor and might need more cleaning  out  11/3- will spread out AM meds to see if helps N/V.  LOS: 17 days A FACE TO FACE EVALUATION WAS PERFORMED  Charlett Blake 02/17/2021, 9:31 AM

## 2021-02-17 NOTE — Progress Notes (Signed)
Physical Therapy Weekly Progress Note  Patient Details  Name: [name] MRN: [id number] Date of Birth: [birth year]  Beginning of progress report period: February 08, 2021 End of progress report period: February 17, 2021  Today's Date: 02/17/2021 PT Individual Time: 1300-1315;1330-1400 PT Individual Time Calculation (min): 15 min and 30 min  PT Amount of Missed Time (min): 30 Minutes PT Missed Treatment Reason: Pain  Patient has met 0 of 1 short term goals.  LTG were to to STG due to ELOS. Pt's LOS was extended due to ongoing medical complications and severe pain that has yet to be controlled. Pt continues to make gains regarding functional mobility and is min A overall for bed mobility, CGA to stand from elevated surfaces and min A from lower surfaces to RW, can ambulate up to 120 ft with RW at Supervision level, and has been able to navigate stairs with CGA. Pt does remain limited by back pain that limits his tolerance for OOB mobility and for full participation in therapy sessions.  Patient continues to demonstrate the following deficits muscle weakness and muscle joint tightness, decreased cardiorespiratoy endurance, and decreased standing balance, decreased postural control, and decreased balance strategies and therefore will continue to benefit from skilled PT intervention to increase functional independence with mobility.  Patient progressing toward long term goals..  Continue plan of care.  PT Short Term Goals Week 2:  PT Short Term Goal 1 (Week 2): =LTG due to ELOS PT Short Term Goal 1 - Progress (Week 2): Progressing toward goal Week 3:  PT Short Term Goal 1 (Week 3): =LTG due to ELOS  Skilled Therapeutic Interventions/Progress Updates:    Session 1: Pt received supine in bed, agreeable to PT session. Pt reports 8/10 pain in R lower back and L mid-back lateral to spine. Supine to sit with Supervision and use of bedrail. Pt requires hospital bed elevated in seated position for  improved comfort, per pt report his wife has not yet measured their bed at home to determine height. Assisted pt with applying muscle cream to R lower back for pain relief, pt requesting ice pack for pain in L mid-back. Sit to stand with CGA to RW from elevated bed. Stand pivot transfer to recliner with RW and Supervision. Obtained ice pack for pt. Pt left seated in recliner in room with ice pack in place, given time for ice pack to work prior to therapist return to continue therapy session.  Session 2: Pt received seated in recliner after being given 15 min with ice pack to L mid-back for pain management. Pt reports some improvement in pain symptoms. Encouraged pt to ambulate in hallway this date, he declines but agreeable for short distance ambulation in hospital room. Sit to stand with min A from lower surface of recliner chair. Ambulation 2 x 90 ft, 1 x 120 ft with RW and close Supervision in hospital room. Pt ambulates with flexed trunk posture and B knee flexion, able to extend posture but has increase in back pain with upright position. Pt reports pain increases to "15/10" following ambulation. Nursing able to provide pain medication during session. Pt declines further activity following 3 rounds of ambulation (seated rest break between each round). Pt left seated in recliner in room with needs in reach, ice pack to L mid-back. Pt missed 30 min total of scheduled therapy session this date due to pain.   Therapy Documentation Precautions:  Precautions Precautions: Fall, Back, Other (comment) Precaution Booklet Issued: No Precaution Comments: wound  vac, pt able to recall back precautions Required Braces or Orthoses: Other Brace Other Brace: no brace needed per order, d/t wound vac Restrictions Weight Bearing Restrictions: No General: PT Amount of Missed Time (min): 30 Minutes PT Missed Treatment Reason: Pain     Therapy/Group: Individual Therapy  Excell Seltzer, PT, DPT,  CSRS 02/17/2021, 2:06 PM

## 2021-02-17 NOTE — Progress Notes (Signed)
Occupational Therapy Session Note  Patient Details  Name: [name] MRN: [id number] Date of Birth: [birth year]  Today's Date: 02/18/2021 OT Individual Time: 1003-1047 OT Individual Time Calculation (min): 44 min  16 minutes missed  Short Term Goals: Week 2:  OT Short Term Goal 1 (Week 2): STG=LTG 2/2 ELOS (continue working towards supervision/mod I LTG)  Skilled Therapeutic Interventions/Progress Updates:    Pt greeted in bed with c/o 9/10 pain. Staff had just changed his wound vac dressings. He initially declined tx but agreeable to bedlevel exercises with encouragement. Pt in sidelying and required vcs for back precaution adherence because he refused to participate in exercises while supine with HOB raised. Guided him first through gentle UB stretches with emphasis placed on coordinating breath with movement. Education at length provided in regards to holistic pain mgt strategies including meaningful music listening and diaphragmatic breathing. Taught him diaphragmatic breathing exercises during session. Also listened to favorite music to enhance motivation. Pt very participative during stretches and during ther-ex after using 2# bar x15 reps 2 sets. At end of session he remained in bed, left with all needs within reach. He reported pain at this time was 8/10 though still fatigued, opting to terminate session early. Time missed due to pain/fatigue.   Therapy Documentation Precautions:  Precautions Precautions: Fall, Back, Other (comment) Precaution Booklet Issued: No Precaution Comments: wound vac, pt able to recall back precautions Required Braces or Orthoses: Other Brace Other Brace: no brace needed per order, d/t wound vac Restrictions Weight Bearing Restrictions: No General: General OT Amount of Missed Time: 16 Minutes Pain: Pain Assessment Pain Scale: 0-10 Pain Score: 8  Faces Pain Scale: Hurts a little bit Pain Type: Surgical pain Pain Location: Back Pain Orientation:  Lower Pain Descriptors / Indicators: Aching;Dull Pain Intervention(s): Medication (See eMAR) PAINAD (Pain Assessment in Advanced Dementia) Breathing: normal Negative Vocalization: none Facial Expression: smiling or inexpressive Body Language: relaxed Consolability: no need to console PAINAD Score: 0 ADL: ADL Eating: Independent Grooming: Setup Where Assessed-Grooming: Sitting at sink Upper Body Bathing: Setup Where Assessed-Upper Body Bathing: Edge of bed Lower Body Bathing: Moderate assistance Where Assessed-Lower Body Bathing: Edge of bed Where Assessed-Upper Body Dressing: Sitting at sink Lower Body Dressing: Moderate assistance (reacher for pants. needs A with shoes) Toileting: Minimal assistance Where Assessed-Toileting: Toilet, Bedside Commode Toilet Transfer: Minimal assistance Toilet Transfer Method: Counselling psychologist: Extra wide bedside commode, Grab bars Tub/Shower Transfer: Not assessed   Therapy/Group: Individual Therapy  [name] 02/18/2021, 12:53 PM

## 2021-02-18 LAB — CBC
HCT: 27.7 % — ABNORMAL LOW (ref 39.0–52.0)
Hemoglobin: 8.9 g/dL — ABNORMAL LOW (ref 13.0–17.0)
MCH: 28.4 pg (ref 26.0–34.0)
MCHC: 32.1 g/dL (ref 30.0–36.0)
MCV: 88.5 fL (ref 80.0–100.0)
Platelets: 223 10*3/uL (ref 150–400)
RBC: 3.13 MIL/uL — ABNORMAL LOW (ref 4.22–5.81)
RDW: 14.9 % (ref 11.5–15.5)
WBC: 7.7 10*3/uL (ref 4.0–10.5)
nRBC: 0 % (ref 0.0–0.2)

## 2021-02-18 LAB — BASIC METABOLIC PANEL
Anion gap: 7 (ref 5–15)
BUN: 8 mg/dL (ref 6–20)
CO2: 28 mmol/L (ref 22–32)
Calcium: 8.4 mg/dL — ABNORMAL LOW (ref 8.9–10.3)
Chloride: 100 mmol/L (ref 98–111)
Creatinine, Ser: 0.93 mg/dL (ref 0.61–1.24)
GFR, Estimated: >60 mL/min (ref >60)
Glucose, Bld: 94 mg/dL (ref 70–99)
Potassium: 4.3 mmol/L (ref 3.5–5.1)
Sodium: 135 mmol/L (ref 135–145)

## 2021-02-18 LAB — GLUCOSE, CAPILLARY
Glucose-Capillary: 113 mg/dL — ABNORMAL HIGH (ref 70–99)
Glucose-Capillary: 110 mg/dL — ABNORMAL HIGH (ref 70–99)
Glucose-Capillary: 135 mg/dL — ABNORMAL HIGH (ref 70–99)
Glucose-Capillary: 151 mg/dL — ABNORMAL HIGH (ref 70–99)

## 2021-02-18 LAB — SEDIMENTATION RATE: Sed Rate: 77 mm/hr — ABNORMAL HIGH (ref 0–16)

## 2021-02-18 LAB — C-REACTIVE PROTEIN: CRP: 7 mg/dL — ABNORMAL HIGH (ref <1.0)

## 2021-02-18 MED ORDER — ENOXAPARIN (LOVENOX) PATIENT EDUCATION KIT
PACK | Freq: Once | Status: DC
  Filled 2021-02-18: qty 1

## 2021-02-18 NOTE — Consult Note (Signed)
[place] Nurse wound follow up Patient receiving care in [facility] 4W16 Scheduled for discharge on Wed 11/9 with [facility] for MWF vac dressing changes.  Wound type: Surgical lumbar wound Wound bed: Seeing some shiny pink tissue this AM with less yellow fibrinous tissue scattered throughout. Since adding the white foam the bleeding with dressing removal has decreased a great amount. Still some blistering around the wound but seems to be improving with applying the non-adherent gauze on the areas. The blistering above the proximal end of the wound is healing. New blister on the right side where the foam is bridged.  Drainage (amount, consistency, odor) Sanguinous  in the canister. Canister changed today. 400 cc in canister removed.  Procedure/placement/frequency: 2 pieces of black foam removed. 2 pieced of white foam removed. Used 24M skin barrier wipes on the surrounding skin. 2 pieces of white foam used first in the base of the wound then covered with 1 large piece of black foam. Bridged over to the right of the wound using 1 piece of black foam. Non-adherent gauze placed over the blister on the right side. 2 pieces of black foam used all together. Drape applied, used Tegaderm for extra drape needed, immediate suction obtained at 125 mmHg. Patient sits up on the side of the bed for the dressing change. Premedication needed prior to procedure.  Supplies at bedside in top drawer.   [facility] following M/W/F   Cathlean [place]. Tamala Julian, MSN, RN, [place], Lysle Pearl, [facility] Wound Treatment Associate Pager [phone]

## 2021-02-18 NOTE — Progress Notes (Signed)
Physical Therapy Session Note  Patient Details  Name: [name] MRN: [id number] Date of Birth: 28-Jan-1966  Today's Date: 02/18/2021 PT Individual Time: 1400-1440 PT Individual Time Calculation (min): 40 min   Short Term Goals: Week 3:  PT Short Term Goal 1 (Week 3): =LTG due to ELOS  Skilled Therapeutic Interventions/Progress Updates:    Pt received seated in bed, agreeable to PT session. Pt reports ongoing pain in L mid-back this date. Reports MD is going to address with injection tomorrow. Supine to sit with Supervision with HOB elevated and heavy reliance on bedrail. Provided handout for where to purchase bedrail as pt continues to require use of it for increased independence with bed mobility. Pt's DME has arrived to his room. Adjusted RW to appropriate height for patient. Wheelchair is 20x16 and had ordered 24x18, social work notified that wrong size w/c delivered to pt's room. Pt attempts to sit in 20x16 and does not fit in this size chair due to body habitus. Pt reports urge to use the bathroom. Sit to stand with Supervision to RW from elevated surface of bed. Ambulatory transfer in to bathroom with RW and Supervision. Pt requires assist for clothing management, dependent for pericare following continent BM on toilet. Pt declines further participation in therapy session following toileting due to back pain. Pt declines use of thermal modality for pain management. Pt agreeable to remain seated in recliner at end of session, needs in reach. Pt missed 20 min of scheduled therapy session due to pain.  Therapy Documentation Precautions:  Precautions Precautions: Fall, Back, Other (comment) Precaution Booklet Issued: No Precaution Comments: wound vac, pt able to recall back precautions Required Braces or Orthoses: Other Brace Other Brace: no brace needed per order, d/t wound vac Restrictions Weight Bearing Restrictions: No General: PT Amount of Missed Time (min): 20 Minutes PT Missed  Treatment Reason: Pain      Therapy/Group: Individual Therapy   Excell Seltzer, PT, DPT, CSRS  02/18/2021, 5:33 PM

## 2021-02-18 NOTE — Progress Notes (Signed)
[place] PHYSICAL MEDICINE & REHABILITATION PROGRESS NOTE  Subjective/Complaints:  Pt reports trigger point injections wearing off, but worked really well.   Doesn't want to learn to cath- d/w wife/pt and wife clear pt has to cath- she will be at work!   ROS:   Pt denies SOB, abd pain, CP, N/V/C/D, and vision changes   Objective: Vital Signs: Blood pressure 115/68, pulse 93, temperature 99 F (37.2 C), temperature source Oral, resp. rate 18, height _0  (1.88 m), weight 135.6 kg, SpO2 97 %. No results found. Recent Labs    02/18/21 0410  WBC 7.7  HGB 8.9*  HCT 27.7*  PLT 223    Recent Labs    02/18/21 0410  NA 135  K 4.3  CL 100  CO2 28  GLUCOSE 94  BUN 8  CREATININE 0.93  CALCIUM 8.4*    Intake/Output Summary (Last 24 hours) at 02/18/2021 1835 Last data filed at 02/18/2021 1744 Gross per 24 hour  Intake 240 ml  Output 3175 ml  Net -2935 ml        Physical Exam: BP 115/68 (BP Location: Left Arm)   Pulse 93   Temp 99 F (37.2 C) (Oral)   Resp 18   Ht _1  (1.88 m)   Wt 135.6 kg   SpO2 97%   BMI 38.38 kg/m     General: awake, alert, appropriate, NAD HENT: conjugate gaze; oropharynx moist CV: regular rate; no JVD Pulmonary: CTA B/L; no W/R/R- good air movement GI: soft, NT, ND, (+)BS Psychiatric: appropriate; flat; more interactive Neurological: Ox3 Skin- wound VAC on low back incision  Musculoskeletal: TTP, but less so R low back- palpable trigger points Skin: Warm and dry. VAC in place- suctioning working/in place- no change Musc: Neuro: Alert Motor: Bilateral upper extremities: 5/5 proximal distal LE- HF 4/5; KE/KF 4+/5; Df/PF 4+/5  Sensation intact   Assessment/Plan: 1. Functional deficits which require 3+ hours per day of interdisciplinary therapy in a comprehensive inpatient rehab setting. Physiatrist is providing close team supervision and 24 hour management of active medical problems listed below. Physiatrist and rehab team  continue to assess barriers to discharge/monitor patient progress toward functional and medical goals   Care Tool:  Bathing    Body parts bathed by patient: Right arm, Left arm, Chest, Abdomen, Front perineal area, Right upper leg, Left upper leg, Face   Body parts bathed by helper: Buttocks, Right lower leg, Left lower leg     Bathing assist Assist Level: Minimal Assistance - Patient > 75%     Upper Body Dressing/Undressing Upper body dressing   What is the patient wearing?: Hospital gown only    Upper body assist Assist Level: Minimal Assistance - Patient > 75%    Lower Body Dressing/Undressing Lower body dressing      What is the patient wearing?: Pants     Lower body assist Assist for lower body dressing: Supervision/Verbal cueing     Toileting Toileting    Toileting assist Assist for toileting: Moderate Assistance - Patient 50 - 74%     Transfers Chair/bed transfer  Transfers assist  Chair/bed transfer activity did not occur: Safety/medical concerns  Chair/bed transfer assist level: Supervision/Verbal cueing     Locomotion Ambulation   Ambulation assist   Ambulation activity did not occur: Safety/medical concerns  Assist level: Supervision/Verbal cueing Assistive device: Walker-rolling Max distance: 120'   Walk 10 feet activity   Assist  Walk 10 feet activity did not occur: Safety/medical concerns  Assist level: Supervision/Verbal cueing Assistive device: Walker-rolling   Walk 50 feet activity   Assist Walk 50 feet with 2 turns activity did not occur: Safety/medical concerns  Assist level: Supervision/Verbal cueing Assistive device: Walker-rolling    Walk 150 feet activity   Assist Walk 150 feet activity did not occur: Safety/medical concerns         Walk 10 feet on uneven surface  activity   Assist Walk 10 feet on uneven surfaces activity did not occur: Safety/medical concerns         Wheelchair     Assist Is  the patient using a wheelchair?: Yes   Wheelchair activity did not occur: Safety/medical concerns         Wheelchair 50 feet with 2 turns activity    Assist    Wheelchair 50 feet with 2 turns activity did not occur: Safety/medical concerns       Wheelchair 150 feet activity     Assist  Wheelchair 150 feet activity did not occur: Safety/medical concerns        Medical Problem List and Plan: 1.  Lumbar radiculopathy/myelopathy secondary to nerve root compression- s/p decompression and wash out after bacteremia from klebsiella pneumonia 11/7- con't PT and OT- pt hasn't learned to cath yet- explained he HAS to today- con't therapies and family training.  2.  Impaired mobility: continue Lovenox  Resume lovenox given that pt not undergoing IR procedure  Dr Zada Finders to f/u  11/2- will need after d/c for a total of 2 months from original surgery. 11/7- will order lovenox injection kit- needs to do at home              -antiplatelet therapy: N/A 3. Postoperative pain: Continue Oxycodone 15 mg prn. Resumed MSContin for more consistent pain relief. Scheduled Tizanidine TID. Resumed muscle rub to right thigh as it was helping before.  10/27- will add valium 5 mg nightly for muscle spasms  10/28- feels like pain a little better- reminded has IV Dilaudid for VAC changes- will take  11/1- will increase MS Contin to 30 mg BID  11/3- lidocaine patches helpful- will go over risks/benefits of trigger point  11/4- did trigger point injections- helpful  11/7- will do trigger point injections again tomorrow  Monitor with increased exertion 4. Mood: Team to provide ego support. LCSW to follow for evaluation and support.              -antipsychotic agents: N/A 5. Neuropsych: This patient is capable of making decisions on his own behalf. 6. Skin/Wound Care:  Monitor wound daily. Added vitamins and protein supplement to help promote wound healing.  7. Fluids/Electrolytes/Nutrition:  Monitor I/Os 8. Klebsiella bacteremia w/diskitis: On Cefazolin 2 g every 8 hrs with end date 03/11/21 -- Check weekly CRP/ESR.   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  11/7- ESR 77- much better and CRP down to 7- again, better- con't regimen 9. HTN: Monitor BP tid--continue Microzide and cozaar daily.  10/24- d/c HCTZ due to low Na   Vitals:   02/18/21 1357 02/18/21 1810  BP: 138/83 115/68  Pulse: (!) 103 93  Resp: 18 18  Temp: 98.3 F (36.8 C) 99 F (37.2 C)  SpO2: 94% 97%   Mild tachy with BPs in normal to elevated range 10. T2DM with hyperglycemia: CM diet. On Metformin 1000 mg bid  10/26- BG's 140s-160s usually- con't regimen  10/29- somehow CBGs aren't being done- reordered  10/30- BG's 140s-160s usually- con't regimen  CBG (last 3)  Recent Labs    02/18/21 0639 02/18/21 1127 02/18/21 1613  GLUCAP 113* 110* 135*   11/7- controlled BG's- con't regimen 11. Neurogenic bladder: with urinary retention On flomax. Increased to 0.8 mg nightly   10/24- still not voiding- will check U/A and Cx 10/26- no voiding- U/A (-)  111/3- explained to pt he also needs to learn to cath- if wife isn't home-will ask nursing to teach.    11/7- wife insists pt will need to cath- asked OT to get mirror to  teach 13. Neuropathy: Continue Neurontin 600 mg TID.  14. ABLA: monitor for signs of bleeding. Hgb stabilizing to 8 range.  Hb 8.9 on 10/21  10/28- Hb down to 7.7- which might be lower, since kidney function is worse- will recheck in AM  10/29- Hb 7.5- likely from hemodilution and wound VAC- con't to recheck - Monday  11/3- pt Hb down to 7.3- pulse is 104- and symptomatic- will recheck in AM, but will recheck in AM- will likely need transfusion tomorrow or Saturday.   11/4- actually, had episode where BP dropped to 79/40s yesterday with standing and HR up to 124- transfused 1 unit- Hb back up to 8.5-   11/7- Hb up to 8.9- con't to monitor Repeat Hgb in am 11/6 Cont to monitor 15. 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 -- Follow up with surgery after discharge.  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  17. Abdominal pain: Much better--dysesthesias?  18. Hypomagnesemia: Resumed Mag Ox.   Mag 1.6 on 10/21, labs ordered for Monday  10/24- Mg 2.1  10/29- Mg 2.1- monitor 19. Hyponatremia  Na+ 132 on 10/21, labs ordered for Monday  10/24- Na down to 128- will do fluid restriction 1500cc- and stop HCTZ  10/25- Na 127- will recheck in AM after off HCTZ and on fluid restriction  10/26- Na up to 129- con't fluid restriction and recheck Friday  10/29- Na up to 131- recheck in AM 10/31 Na+ up to 134  11/3- Na up to 134  11/4- will reduce fluid restriction to 2000 cc/day  11/7- Na up to 135- doing better- con't 2000cc fluid restriction 20. Hypoalbuminemia  Supplement initiated on 10/21 21. Azotemia/ARI   10/31 resolved , off IVF  , cont to monitor weekly   22 Constipation  10/24- will give Sorbitol 66 and increase bowel meds- increase Senokot to 2 tabs BID.   10/29- Sorbitol 60cc- if doesn't work, give lactulose  11/1- LBM yesterday- con't regimen  11/3- if no BM today, need to clean out again- keep increasing bowel meds 23. Hypokalemia  10/28- will give  KCL x2 40  mEq and recheck in AM- also checking Mg since can cause it to be low as well.  10/31 resolved K+ 3.9, off supplementation , repeat BMET in am  24. N/V- improved 11/6  10/30- will give antinausea medicine- KUB (-) except for constipation- has pooped 2x-  Will monitor and might need more cleaning out  11/3- will spread out AM meds to see if helps N/V.    LOS: 18 days A FACE TO FACE EVALUATION WAS PERFORMED  Shantera Monts 02/18/2021, 6:35 PM

## 2021-02-18 NOTE — Progress Notes (Signed)
Occupational Therapy Note  Patient Details  Name: ANDERSSON LARRABEE MRN: [id number] Date of Birth: 1965-12-05  Today's Date: 02/18/2021 OT Missed Time: 3 Minutes Missed Time Reason: Pain  Patient greeted sidelying in bed. Pt reported high pain after wound vac change and unable to participate in OT this morning. OT to follow up per plan of care as pt can tolerate.    Daneen Schick Orland Visconti 02/18/2021, 12:58 PM

## 2021-02-18 NOTE — Progress Notes (Signed)
Pt refused cpap for the night.  

## 2021-02-19 ENCOUNTER — Other Ambulatory Visit (HOSPITAL_COMMUNITY): Payer: Self-pay

## 2021-02-19 LAB — GLUCOSE, CAPILLARY
Glucose-Capillary: 112 mg/dL — ABNORMAL HIGH (ref 70–99)
Glucose-Capillary: 118 mg/dL — ABNORMAL HIGH (ref 70–99)
Glucose-Capillary: 115 mg/dL — ABNORMAL HIGH (ref 70–99)
Glucose-Capillary: 97 mg/dL (ref 70–99)

## 2021-02-19 MED ORDER — POLYETHYLENE GLYCOL 3350 17 GM/SCOOP PO POWD
17.0000 g | Freq: Every day | ORAL | 1 refills | Status: DC
  Filled 2021-02-19: qty 510, 30d supply, fill #0

## 2021-02-19 MED ORDER — MORPHINE SULFATE ER 15 MG PO TBCR
30.0000 mg | EXTENDED_RELEASE_TABLET | Freq: Two times a day (BID) | ORAL | 0 refills | Status: DC
Start: 2021-02-19 — End: 2021-03-15
  Filled 2021-02-19: qty 60, 15d supply, fill #0

## 2021-02-19 MED ORDER — ASCORBIC ACID 500 MG PO TABS
500.0000 mg | ORAL_TABLET | Freq: Two times a day (BID) | ORAL | 0 refills | Status: DC
  Filled 2021-02-19: qty 100, 50d supply, fill #0

## 2021-02-19 MED ORDER — TIZANIDINE HCL 4 MG PO TABS
4.0000 mg | ORAL_TABLET | Freq: Three times a day (TID) | ORAL | 1 refills | Status: DC
  Filled 2021-02-19: qty 90, 30d supply, fill #0

## 2021-02-19 MED ORDER — TAMSULOSIN HCL 0.4 MG PO CAPS
0.8000 mg | ORAL_CAPSULE | Freq: Every day | ORAL | 1 refills | Status: DC
  Filled 2021-02-19: qty 60, 30d supply, fill #0

## 2021-02-19 MED ORDER — MORPHINE SULFATE ER 15 MG PO TBCR
30.0000 mg | EXTENDED_RELEASE_TABLET | Freq: Two times a day (BID) | ORAL | 0 refills | Status: DC
Start: 2021-02-19 — End: 2021-02-19
  Filled 2021-02-19: qty 28, 7d supply, fill #0

## 2021-02-19 MED ORDER — SENNA 8.6 MG PO TABS
3.0000 | ORAL_TABLET | Freq: Two times a day (BID) | ORAL | 0 refills | Status: AC
  Filled 2021-02-19: qty 100, 17d supply, fill #0

## 2021-02-19 MED ORDER — LIDOCAINE 5 % EX PTCH
2.0000 | MEDICATED_PATCH | CUTANEOUS | 0 refills | Status: DC
  Filled 2021-02-19: qty 60, 30d supply, fill #0

## 2021-02-19 MED ORDER — CITALOPRAM HYDROBROMIDE 20 MG PO TABS
20.0000 mg | ORAL_TABLET | Freq: Every day | ORAL | 1 refills | Status: DC
  Filled 2021-02-19: qty 30, 30d supply, fill #0

## 2021-02-19 MED ORDER — HYDROMORPHONE HCL 2 MG PO TABS
2.0000 mg | ORAL_TABLET | ORAL | 0 refills | Status: DC
  Filled 2021-02-19: qty 10, 23d supply, fill #0

## 2021-02-19 MED ORDER — MUSCLE RUB 10-15 % EX CREA
1.0000 "application " | TOPICAL_CREAM | Freq: Three times a day (TID) | CUTANEOUS | 0 refills | Status: DC
  Filled 2021-02-19: qty 85, 22d supply, fill #0

## 2021-02-19 MED ORDER — CYCLOBENZAPRINE HCL 10 MG PO TABS
10.0000 mg | ORAL_TABLET | Freq: Three times a day (TID) | ORAL | 0 refills | Status: DC | PRN
  Filled 2021-02-19: qty 90, 30d supply, fill #0

## 2021-02-19 MED ORDER — ENOXAPARIN SODIUM 40 MG/0.4ML IJ SOSY
40.0000 mg | PREFILLED_SYRINGE | INTRAMUSCULAR | 0 refills | Status: DC
  Filled 2021-02-19: qty 14.8, 37d supply, fill #0

## 2021-02-19 MED ORDER — BENEPROTEIN PO POWD
1.0000 | Freq: Three times a day (TID) | ORAL | 0 refills | Status: DC
  Filled 2021-02-19: qty 227, 13d supply, fill #0

## 2021-02-19 MED ORDER — DIAZEPAM 5 MG PO TABS
5.0000 mg | ORAL_TABLET | Freq: Every day | ORAL | 0 refills | Status: DC
  Filled 2021-02-19: qty 30, 30d supply, fill #0

## 2021-02-19 MED ORDER — LIDOCAINE 5 % EX PTCH
2.0000 | MEDICATED_PATCH | CUTANEOUS | 0 refills | Status: DC
  Filled 2021-02-19: qty 30, 15d supply, fill #0

## 2021-02-19 MED ORDER — OXYCODONE HCL 15 MG PO TABS
15.0000 mg | ORAL_TABLET | Freq: Three times a day (TID) | ORAL | 0 refills | Status: DC | PRN
  Filled 2021-02-19: qty 27, 9d supply, fill #0

## 2021-02-19 MED ORDER — ZINC SULFATE 220 (50 ZN) MG PO TABS
220.0000 mg | ORAL_TABLET | Freq: Every day | ORAL | 1 refills | Status: DC
  Filled 2021-02-19: qty 60, 60d supply, fill #0

## 2021-02-19 MED ORDER — LOSARTAN POTASSIUM 100 MG PO TABS
100.0000 mg | ORAL_TABLET | Freq: Every day | ORAL | 1 refills | Status: DC
  Filled 2021-02-19: qty 30, 30d supply, fill #0

## 2021-02-19 MED ORDER — GABAPENTIN 400 MG PO CAPS
800.0000 mg | ORAL_CAPSULE | Freq: Three times a day (TID) | ORAL | Status: DC
  Administered 2021-02-19 – 2021-03-08 (×52): 800 mg via ORAL
  Filled 2021-02-19 (×51): qty 2

## 2021-02-19 MED ORDER — GABAPENTIN 400 MG PO CAPS
800.0000 mg | ORAL_CAPSULE | Freq: Three times a day (TID) | ORAL | 1 refills | Status: DC
  Filled 2021-02-19: qty 180, 30d supply, fill #0

## 2021-02-19 NOTE — Progress Notes (Signed)
[name] PHYSICAL MEDICINE & REHABILITATION PROGRESS NOTE  Subjective/Complaints:  Pt still hasn't learned to cath- I explained he either, learns to cath, goes home with foley, or has risk of his bladder rupturing. Explained he HAS to learn today or we place foley.  Bowels working well.    W/C brought to small per pt-  Trigger point injections are wearing off- will do again today.   ROS:   Pt denies SOB, abd pain, CP, N/V/C/D, and vision changes    Objective: Vital Signs: Blood pressure 128/79, pulse 97, temperature 98.5 F (36.9 C), resp. rate 20, height _0  (1.88 m), weight 135.6 kg, SpO2 97 %. No results found. Recent Labs    02/18/21 0410  WBC 7.7  HGB 8.9*  HCT 27.7*  PLT 223    Recent Labs    02/18/21 0410  NA 135  K 4.3  CL 100  CO2 28  GLUCOSE 94  BUN 8  CREATININE 0.93  CALCIUM 8.4*    Intake/Output Summary (Last 24 hours) at 02/19/2021 1031 Last data filed at 02/19/2021 1016 Gross per 24 hour  Intake 840 ml  Output 3525 ml  Net -2685 ml        Physical Exam: BP 128/79 (BP Location: Left Arm)   Pulse 97   Temp 98.5 F (36.9 C)   Resp 20   Ht _1  (1.88 m)   Wt 135.6 kg   SpO2 97%   BMI 38.38 kg/m      General: awake, alert, appropriate, appears uncomfortable from sitting up in bedside chair; NAD HENT: conjugate gaze; oropharynx moist CV: regular rate; no JVD Pulmonary: CTA B/L; no W/R/R- good air movement GI: soft, NT, ND, (+)BS; protuberant Psychiatric: appropriate Neurological: Ox3 Skin- sitting up in bedside chair with wound VAC in place  Musculoskeletal: TTP, but less so R low back- palpable trigger points Skin: Warm and dry. VAC in place- suctioning working/in place- no change Musc: Neuro: Alert Motor: Bilateral upper extremities: 5/5 proximal distal LE- HF 4/5; KE/KF 4+/5; Df/PF 4+/5  Sensation intact   Assessment/Plan: 1. Functional deficits which require 3+ hours per day of interdisciplinary therapy in a  comprehensive inpatient rehab setting. Physiatrist is providing close team supervision and 24 hour management of active medical problems listed below. Physiatrist and rehab team continue to assess barriers to discharge/monitor patient progress toward functional and medical goals   Care Tool:  Bathing    Body parts bathed by patient: Right arm, Left arm, Chest, Abdomen, Front perineal area, Right upper leg, Left upper leg, Face   Body parts bathed by helper: Buttocks, Right lower leg, Left lower leg     Bathing assist Assist Level: Minimal Assistance - Patient > 75%     Upper Body Dressing/Undressing Upper body dressing   What is the patient wearing?: Hospital gown only    Upper body assist Assist Level: Minimal Assistance - Patient > 75%    Lower Body Dressing/Undressing Lower body dressing      What is the patient wearing?: Pants     Lower body assist Assist for lower body dressing: Supervision/Verbal cueing     Toileting Toileting    Toileting assist Assist for toileting: Moderate Assistance - Patient 50 - 74%     Transfers Chair/bed transfer  Transfers assist  Chair/bed transfer activity did not occur: Safety/medical concerns  Chair/bed transfer assist level: Supervision/Verbal cueing     Locomotion Ambulation   Ambulation assist   Ambulation activity did not occur:  Safety/medical concerns  Assist level: Supervision/Verbal cueing Assistive device: Walker-rolling Max distance: 120'   Walk 10 feet activity   Assist  Walk 10 feet activity did not occur: Safety/medical concerns  Assist level: Supervision/Verbal cueing Assistive device: Walker-rolling   Walk 50 feet activity   Assist Walk 50 feet with 2 turns activity did not occur: Safety/medical concerns  Assist level: Supervision/Verbal cueing Assistive device: Walker-rolling    Walk 150 feet activity   Assist Walk 150 feet activity did not occur: Safety/medical concerns          Walk 10 feet on uneven surface  activity   Assist Walk 10 feet on uneven surfaces activity did not occur: Safety/medical concerns         Wheelchair     Assist Is the patient using a wheelchair?: Yes   Wheelchair activity did not occur: Safety/medical concerns         Wheelchair 50 feet with 2 turns activity    Assist    Wheelchair 50 feet with 2 turns activity did not occur: Safety/medical concerns       Wheelchair 150 feet activity     Assist  Wheelchair 150 feet activity did not occur: Safety/medical concerns        Medical Problem List and Plan: 1.  Lumbar radiculopathy/myelopathy secondary to nerve root compression- s/p decompression and wash out after bacteremia from klebsiella pneumonia 11/7- con't PT and OT- pt hasn't learned to cath yet- explained he HAS to today- con't therapies and family training. 11/8- family training- d/c tomorrow- con't PT and OT  2.  Impaired mobility: continue Lovenox  Resume lovenox given that pt not undergoing IR procedure  Dr Zada Finders to f/u  11/2- will need after d/c for a total of 2 months from original surgery. 11/7- will order lovenox injection kit- needs to do at home              -antiplatelet therapy: N/A 3. Postoperative pain: Continue Oxycodone 15 mg prn. Resumed MSContin for more consistent pain relief. Scheduled Tizanidine TID. Resumed muscle rub to right thigh as it was helping before.  10/27- will add valium 5 mg nightly for muscle spasms  10/28- feels like pain a little better- reminded has IV Dilaudid for VAC changes- will take  11/1- will increase MS Contin to 30 mg BID  11/3- lidocaine patches helpful- will go over risks/benefits of trigger point  11/8- trigger point injections today for pain control- will increase gabapentin to 800 mg TID  Monitor with increased exertion 4. Mood: Team to provide ego support. LCSW to follow for evaluation and support.              -antipsychotic agents: N/A 5.  Neuropsych: This patient is capable of making decisions on his own behalf. 6. Skin/Wound Care:  Monitor wound daily. Added vitamins and protein supplement to help promote wound healing.  7. Fluids/Electrolytes/Nutrition: Monitor I/Os 8. Klebsiella bacteremia w/diskitis: On Cefazolin 2 g every 8 hrs with end date 03/11/21 -- Check weekly CRP/ESR.   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  11/7- ESR 77- much better and CRP down to 7- again, better- con't regimen 9. HTN: Monitor BP tid--continue Microzide and cozaar daily.  10/24- d/c HCTZ due to low Na   Vitals:   02/18/21 1810 02/19/21 0458  BP: 115/68 128/79  Pulse: 93 97  Resp: 18 20  Temp: 99 F (37.2 C) 98.5 F (36.9 C)  SpO2: 97% 97%   122/8- BP controlled- con't regimen 10. T2DM with hyperglycemia: CM diet. On Metformin 1000 mg bid  10/26- BG's 140s-160s usually- con't regimen  10/29- somehow CBGs aren't being done- reordered  10/30- BG's 140s-160s usually- con't regimen  CBG (last 3)  Recent Labs    02/18/21 1613 02/18/21 2021 02/19/21 0614  GLUCAP 135* 151* 112*   11/7- controlled BG's- con't regimen 11. Neurogenic bladder: with urinary retention On flomax. Increased to 0.8 mg nightly   10/24- still not voiding- will check U/A and  Cx 10/26- no voiding- U/A (-)  111/3- explained to pt he also needs to learn to cath- if wife isn't home-will ask nursing to teach.    11/7- wife insists pt will need to cath- asked OT to get mirror to teach  11/8-  13. Neuropathy: Continue Neurontin 600 mg TID.  14. ABLA: monitor for signs of bleeding. Hgb stabilizing to 8 range.  Hb 8.9 on 10/21  10/28- Hb down to 7.7- which might be lower, since kidney function is worse- will recheck in AM  10/29- Hb 7.5- likely from hemodilution and wound VAC- con't to recheck - Monday  11/3- pt Hb down to 7.3- pulse is 104- and symptomatic- will recheck in AM, but will recheck in AM- will likely need transfusion tomorrow or Saturday.   11/4- actually, had episode where BP dropped to 79/40s yesterday with standing and HR up to 124- transfused 1 unit- Hb back up to 8.5-   11/7- Hb up to 8.9- con't to monitor Repeat Hgb in am 11/6 Cont to monitor 15. 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 -- Follow up with surgery after discharge.  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  17. Abdominal pain: Much better--dysesthesias?  18. Hypomagnesemia: Resumed Mag Ox.   Mag 1.6 on 10/21, labs ordered for Monday  10/24- Mg 2.1  10/29- Mg 2.1- monitor 19. Hyponatremia  Na+ 132 on 10/21, labs ordered for Monday  10/24- Na down to 128- will do fluid restriction 1500cc- and stop HCTZ  10/25- Na 127- will recheck in AM after off HCTZ and on fluid restriction  10/26- Na up to 129- con't fluid restriction and recheck Friday  10/29- Na up to 131- recheck in AM 10/31 Na+ up to 134  11/3- Na up to 134  11/4- will reduce fluid restriction to 2000 cc/day  11/7- Na up to 135- doing better- con't 2000cc fluid restriction 20. Hypoalbuminemia  Supplement initiated on 10/21 21. Azotemia/ARI   10/31 resolved , off IVF  , cont to monitor weekly   22 Constipation  11/8- better- con't regimen 23. Hypokalemia  10/28- will give KCL x2 40 mEq  and  recheck in AM- also checking Mg since can cause it to be low as well.  10/31 resolved K+ 3.9, off supplementation , repeat BMET in am  24. N/V- improved 11/6  10/30- will give antinausea medicine- KUB (-) except for constipation- has pooped 2x-  Will monitor and might need more cleaning out  11/3- will spread out AM meds to see if helps N/V.    LOS: 19 days A FACE TO FACE EVALUATION WAS PERFORMED  [name] 02/19/2021, 10:31 AM

## 2021-02-19 NOTE — Progress Notes (Addendum)
Inpatient Rehabilitation Discharge Medication Review by a Pharmacist  A complete drug regimen review was completed for this patient to identify any potential clinically significant medication issues.  High Risk Drug Classes Is patient taking? Indication by Medication  Antipsychotic No   Anticoagulant Yes Lovenox for DVT ppx for 2 months post surgery  Antibiotic Yes IV cefazolin for bacteremia / lumbar wound infection until 03/11/21  Opioid Yes Oxycodone for pain / MS contin for pain, Dilaudid prior to dressing changes  Antiplatelet No   Hypoglycemics/insulin Yes Metformin for DM  Vasoactive Medication Yes Losartan for BP  Chemotherapy No   Other Yes Celexa for mood Gabapentin for pain Zanaflex for muscle spasms     Type of Medication Issue Identified Description of Issue Recommendation(s)  Drug Interaction(s) (clinically significant)     Duplicate Therapy     Allergy     No Medication Administration End Date     Incorrect Dose     Additional Drug Therapy Needed     Significant med changes from prior encounter (inform family/care partners about these prior to discharge).    Other       Clinically significant medication issues were identified that warrant physician communication and completion of prescribed/recommended actions by midnight of the next day:  No  Pharmacist comments: Sent for [place] to help with Lovenox   Time spent performing this drug regimen review (minutes):  20 minutes   Tad Moore 02/19/2021 9:02 AM

## 2021-02-19 NOTE — Patient Care Conference (Signed)
Inpatient RehabilitationTeam Conference and Plan of Care Update Date: 02/19/2021   Time: 11:23 AM    Patient Name: [name]      Medical Record Number: [id number]  Date of Birth: [birth year] Sex: Male         Room/Bed: 4W16C/4W16C-01 Payor Info: Payor: Theme park manager / Plan: Theme park manager OTHER / Product Type: *No Product type* /    Admit Date/Time:  01/31/2021  3:41 PM  Primary Diagnosis:  Lumbar disc herniation with myelopathy  Hospital Problems: Principal Problem:   Lumbar disc herniation with myelopathy Active Problems:   Wound infection after surgery   Hypoalbuminemia due to protein-calorie malnutrition (HCC)   Hyponatremia   Hypomagnesemia   Acute blood loss anemia   Postoperative pain   Disto-occlusion   Lumbar discitis   Epidural abscess   Psoas abscess [facility])    Expected Discharge Date: Expected Discharge Date: 02/20/21  Team Members Present: Physician leading conference: Dr. Courtney Heys Social Worker Present: Loralee Pacas, [place] Nurse Present: Dorthula Nettles, RN PT Present: Excell Seltzer, Rachel Moulds, PT OT Present: Roanna Epley, COTA;Jennifer Tamala Julian, OT PPS Coordinator present : Gunnar Fusi, SLP     Current Status/Progress Goal Weekly Team Focus  Bowel/Bladder   Continent of Bowel. Inability to urinate. LBM 02/18/21  Regain Bladder continence.  In/Out Cath   Swallow/Nutrition/ Hydration             ADL's   functional transfers-supervision; LB bathing/dressing-min A: toileting-mod A  supervision/mod I overall  OOB tolerance, pain mgmt, BADL training, education   Mobility   Supervision rolling and supine to sit with bedrail, min to mod A sit to supine, Supervision transfer with RW from elevated surface, min A from lower surface, gait 120 ft with RW Supervision, CGA stairs  Supervision to mod I overall  pain management, endurance, participation, d/c planning   Communication             Safety/Cognition/ Behavioral Observations             Pain   Intermittent Lower Back Pain 6/10  Pain <3.  Assess Q4hr and prn.   Skin   Wound vac to lower back.  No new breakdowns  Assess Q shift and prn.     Discharge Planning:  D/c to home with his family. BIL will be with him during the day; wife home in the evening since she works during the day as a [employer]ar[employer] manager. Continued barriers with obtaining HH therapies for patient due to insurance and care needs (wound vac and IV abx for SN).   Team Discussion: Patient needs to learn to I&O cath himself, patient is requesting a mirror for this. Will do more trigger point injections today. Will have to special order wheelchair. Can't continue to increase pain medications. Failed medication trial before surgery. Will have wound vac for extended period of time. IV antibiotics until 03/11/21. Unable to get [facility] due to staffing needs. On target for discharge tomorrow. Patient on target to meet rehab goals: yes  *See Care Plan and progress notes for long and short-term goals.   Revisions to Treatment Plan:  Patient to I&O cath himself successfully prior to discharge.  Teaching Needs: Family education, medication/pain management, skin/wound care, I&O cath education, safety awareness, transfer training, etc.  Current Barriers to Discharge: Decreased caregiver support, Home enviroment access/layout, Neurogenic bowel and bladder, Wound care, Lack of/limited family support, Weight, Weight bearing restrictions, Medication compliance, Behavior, and pain.  Possible Resolutions to Barriers: Family education I&O cath  education Order DME, loaner wheelchair to be given until special order chair arrives Continue to search [facility] availability  Set-up at [facility] in [address] Infusion therapy education with spouse     Medical Summary Current Status: insisting pt to cath before d/c- will increase nerve pain meds- pain is biggest limiter- continent of bowel - less constipated-  Barriers to Discharge:  Behavior;Decreased family/caregiver support;Weight;Wound care;Weight bearing restrictions;IV antibiotics;Medical stability;Medication compliance;Neurogenic Bowel & Bladder;Home enviroment access/layout  Barriers to Discharge Comments: going home on IV ABX and wound VAC- and cathing- can't get H/H- so will have to get IV ABX by wife Possible Resolutions to [employer]: focus- trigger point injections today=- increased gabapentin today- d/c tomorrow- on goal for goals- barrier is pain   Continued Need for Acute Rehabilitation Level of Care: The patient requires daily medical management by a physician with specialized training in physical medicine and rehabilitation for the following reasons: Direction of a multidisciplinary physical rehabilitation program to maximize functional independence : Yes Medical management of patient stability for increased activity during participation in an intensive rehabilitation regime.: Yes Analysis of laboratory values and/or radiology reports with any subsequent need for medication adjustment and/or medical intervention. : Yes   I attest that I was present, lead the team conference, and concur with the assessment and plan of the team.   Cristi Loron 02/19/2021, 5:05 PM

## 2021-02-19 NOTE — Progress Notes (Signed)
Rx sent to [facility] pharmacy in anticipation of discharge tomorrow. Per reports patient would required prior authorization on Dilaudid, Oxycodone, Valium, MS contin, Lidocaine patches. Spent 7 minutes on the phone initiating prior authorization.   After review, was informed that Hydromorphone and valium ([id number]) does not need prior auth. Lidocaine patches approved ([id number]). Other  Oxycodone and MS contin sent for further review.

## 2021-02-19 NOTE — Progress Notes (Signed)
Lovenox injection education done with pt and spouse, [name]. Wife did well administering injection and pt tolerated well.   Gerald Stabs, RN

## 2021-02-19 NOTE — Progress Notes (Signed)
Patient ID: [name], male   DOB: [birth year], 55 y.o.   MRN: [id number]  SW spoke with [name]/[facility] to discuss continued issues with obtaining HHA. SW asked her to explore [facility] or [facility] to see if they are able to accept.   SW met with pt wife to inform on continued challenges and will see if there is an agency willing to accept.  SW spoke with [name] with regard to having wound vac. Reports will request for delivery today, but likely tomorrow morning to Murphy.   SW spoke with [name] [phone]) to discuss referral submitted. Referral declined due to staffing.    *SW met with pt and pt  wife [name] to explain d/c plan due to no HHA. [facility] will provide HHSN-SOC on Thursday for PICC and weekly labs, [place] [facility] appt at [facility], and option of outpatient therapies. Pt would like outpatient PT/Ot at [facility] at [facility] as close to their home.  Declined HHAs [name]/[name]/[facility]- unable to accept [name]/[facility] [place] [name]/[place] [place], MSW, [place] Office: [phone] Cell: [phone] Fax: [phone]

## 2021-02-19 NOTE — Progress Notes (Signed)
Physical Therapy Discharge Summary  Patient Details  Name: [name] MRN: [id number] Date of Birth: Dec 12, 1965  Today's Date: 02/19/2021  Patient has met 3 of 5 long term goals due to improved activity tolerance, improved balance, increased strength, and ability to compensate for deficits.  Patient to discharge at an ambulatory level Supervision.   Patient's care partner is independent to provide the necessary physical assistance at discharge.  Reasons goals not met: Pt did not meet car transfer goal of Supervision as he requires min A for safe car transfer. He did not meet bed mobility goal of mod I as he still requires assist to bring LE in/out of bed. Pt's family is able to provide this level of assist upon d/c home.  Recommendation:  Patient will benefit from ongoing skilled PT services in outpatient setting to continue to advance safe functional mobility, address ongoing impairments in endurance, strength, safety, balance, pain management, independence with functional mobility, and minimize fall risk.  Equipment: 24x18 w/c (still awaiting arrival), bariatric RW  Reasons for discharge: treatment goals met and discharge from hospital  Patient/family agrees with progress made and goals achieved: Yes  PT Discharge Precautions/Restrictions Precautions Precautions: Fall;Back;Other (comment) Precaution Comments: wound vac, pt able to recall back precautions Other Brace: no brace needed per order, d/t wound vac Restrictions Weight Bearing Restrictions: No Pain Interference Pain Interference Pain Effect on Sleep: 3. Frequently Pain Interference with Therapy Activities: 4. Almost constantly Pain Interference with Day-to-Day Activities: 4. Almost constantly Vision/Perception  Vision - History Ability to See in Adequate Light: 0 Adequate Perception Perception: Within Functional Limits Praxis Praxis: Intact  Cognition Overall Cognitive Status: Within Functional Limits for tasks  assessed Arousal/Alertness: Awake/alert Orientation Level: Oriented X4 Year: 2022 Month: November Day of Week: Correct Attention: Focused;Sustained Focused Attention: Appears intact Sustained Attention: Appears intact Memory: Appears intact Immediate Memory Recall: Sock;Blue;Bed Memory Recall Sock: Without Cue Memory Recall Blue: Without Cue Memory Recall Bed: Without Cue Awareness: Appears intact Problem Solving: Appears intact Safety/Judgment: Appears intact Sensation Sensation Light Touch: Appears Intact Hot/Cold: Appears Intact Proprioception: Appears Intact Stereognosis: Appears Intact Coordination Gross Motor Movements are Fluid and Coordinated: Yes Fine Motor Movements are Fluid and Coordinated: Yes Motor  Motor Motor: Abnormal postural alignment and control Motor - Skilled Clinical Observations: impaired 2/2 pain Motor - Discharge Observations: impaired 2/2 pain  Mobility Bed Mobility Bed Mobility: Rolling Right;Rolling Left;Supine to Sit;Sit to Supine Rolling Left: Supervision/Verbal cueing Supine to Sit: Supervision/Verbal cueing Sit to Supine: Minimal Assistance - Patient > 75% Transfers Transfers: Sit to Stand;Stand Pivot Transfers Sit to Stand: Independent with assistive device Stand to Sit: Independent with assistive device Stand Pivot Transfers: Independent with assistive device Transfer (Assistive device): Rolling walker Locomotion  Gait Ambulation: Yes Gait Assistance: Supervision/Verbal cueing Gait Distance (Feet): 160 Feet Assistive device: Rolling walker Gait Assistance Details: Verbal cues for precautions/safety Gait Gait: Yes Gait Pattern: Impaired Gait Pattern: Decreased step length - right;Decreased step length - left;Right flexed knee in stance;Left flexed knee in stance;Trunk flexed Gait velocity: decreased Stairs / Additional Locomotion Stairs: Yes Stairs Assistance: Contact Guard/Touching assist Stair Management Technique: One rail  Right Number of Stairs: 4 Height of Stairs: 6 Pick up small object from the floor (from standing position) activity did not occur: Safety/medical concerns Wheelchair Mobility Wheelchair Mobility: No  Trunk/Postural Assessment  Cervical Assessment Cervical Assessment: Exceptions to [facility] (forward head) Thoracic Assessment Thoracic Assessment: Exceptions to [facility] (rounded shoulders) Lumbar Assessment Lumbar Assessment: Exceptions to [facility] (posterior pelvic tilt; back precautions)  Postural Control Postural Control: Within Functional Limits  Balance Balance Balance Assessed: Yes Static Sitting Balance Static Sitting - Balance Support: Feet supported Static Sitting - Level of Assistance: 6: Modified independent (Device/Increase time) Dynamic Sitting Balance Dynamic Sitting - Balance Support: During functional activity Dynamic Sitting - Level of Assistance: 6: Modified independent (Device/Increase time) Static Standing Balance Static Standing - Balance Support: Bilateral upper extremity supported;During functional activity Static Standing - Level of Assistance: 5: Stand by assistance Dynamic Standing Balance Dynamic Standing - Balance Support: Bilateral upper extremity supported;During functional activity Dynamic Standing - Level of Assistance: 5: Stand by assistance Extremity Assessment  RUE Assessment RUE Assessment: Within Functional Limits LUE Assessment LUE Assessment: Within Functional Limits RLE Assessment RLE Assessment: Within Functional Limits General Strength Comments: 5/5 grossly LLE Assessment LLE Assessment: Within Functional Limits General Strength Comments: 5/5 grossly    Mady Gemma, PT, DPT Excell Seltzer, PT, DPT, CSRS  02/19/2021, 5:47 PM

## 2021-02-19 NOTE — Discharge Summary (Addendum)
Occupational Therapy Discharge Summary  Patient Details  Name: [name] MRN: [id number] Date of Birth: December 31, 1965   Patient has met 8 of 8 long term goals due to improved activity tolerance, improved balance, postural control, ability to compensate for deficits, and improved coordination.  Pt made slow but steady progress with BADLs anf functional tranfsers during this admission. Pt is mod I for dressing and toileting tasks using AE PRN. Pt completes bathing tasks with supervision. All functional tranfsers with mod I. Pt's wife has been present and participated in education. Pt's wife provides the appropriate level of assistance/supervision. Patient to discharge at overall supervision-Modified Independent level.  Patient's care partner is independent to provide the necessary physical assistance at discharge.    Reasons goals not met: All goals met  Recommendation:  Patient will benefit from ongoing skilled OT services in outpatient setting to continue to advance functional skills in the area of BADL and iADL.  Equipment: BSC  Reasons for discharge: treatment goals met and discharge from hospital  Patient/family agrees with progress made and goals achieved: Yes  OT Discharge   Vision Baseline Vision/History: 1 Wears glasses Patient Visual Report: No change from baseline Vision Assessment?: No apparent visual deficits Perception  Perception: Within Functional Limits Praxis Praxis: Intact Cognition Overall Cognitive Status: Within Functional Limits for tasks assessed Arousal/Alertness: Awake/alert Orientation Level: Oriented X4 Year: 2022 Month: November Day of Week: Correct Attention: Focused;Sustained Focused Attention: Appears intact Sustained Attention: Appears intact Memory: Appears intact Immediate Memory Recall: Sock;Blue;Bed Memory Recall Sock: Without Cue Memory Recall Blue: Without Cue Memory Recall Bed: Without Cue Awareness: Appears intact Problem Solving:  Appears intact Sensation Sensation Light Touch: Appears Intact Hot/Cold: Appears Intact Proprioception: Appears Intact Stereognosis: Appears Intact Coordination Gross Motor Movements are Fluid and Coordinated: Yes Fine Motor Movements are Fluid and Coordinated: Yes Motor  Motor Motor: Within Functional Limits;Other (comment) Trunk/Postural Assessment  Cervical Assessment Cervical Assessment: Exceptions to [facility] (forward head) Thoracic Assessment Thoracic Assessment: Exceptions to [facility] (forward shoulders) Lumbar Assessment Lumbar Assessment: Exceptions to [facility] (posterior pelvic tilt/back precautions) Postural Control Postural Control: Within Functional Limits  Balance Static Sitting Balance Static Sitting - Balance Support: Feet supported Static Sitting - Level of Assistance: 6: Modified independent (Device/Increase time) Dynamic Sitting Balance Dynamic Sitting - Balance Support: During functional activity Dynamic Sitting - Level of Assistance: 6: Modified independent (Device/Increase time) Extremity/Trunk Assessment RUE Assessment RUE Assessment: Within Functional Limits LUE Assessment LUE Assessment: Within Functional Limits

## 2021-02-19 NOTE — Progress Notes (Signed)
Occupational Therapy Session Note  Patient Details  Name: [name] MRN: [id number] Date of Birth: Jan 31, 1966  Today's Date: 02/19/2021 OT Individual Time: 1130-1210 OT Individual Time Calculation (min): 40 min    Short Term Goals: Week 3:  OT Short Term Goal 1 (Week 3): STG=LTG 2/2 ELOS (continue working towards supervision/mod I LTG)  Skilled Therapeutic Interventions/Progress Updates:    Pt resting in bed upon arrival. OT intervention with focus on education and funcitonal amb with RW. Pt requested to use toilet and amb with RW to bathroom mod I (assistance for equipment mgmt.) Toileting with mod I. Pt returned to room. Discussion on positioning for self I/O. Toilet and BSC do not offer enough space for pt to access and use catheter. Discussed use of mirror to assist. Mirror later provided to wife to try. Discussed with nurse the plan for pt to sit EOB for I/O training. Pt requested to sit in recliner to eat lunch. Mod I transfer to recliner. All needs within reach.   Therapy Documentation Precautions:  Precautions Precautions: Fall, Back, Other (comment) Precaution Booklet Issued: No Precaution Comments: wound vac, pt able to recall back precautions Required Braces or Orthoses: Other Brace Other Brace: no brace needed per order, d/t wound vac Restrictions Weight Bearing Restrictions: No Pain: Pt c/o 7/10 low back pain; repositioned  Therapy/Group: Individual Therapy  [name] 02/19/2021, 12:22 PM

## 2021-02-19 NOTE — Discharge Instructions (Addendum)
Inpatient Rehab Discharge Instructions  [name] Discharge date and time: 02/25/21   Activities/Precautions/ Functional Status: Activity: No lifting, driving, or strenuous exercise  till cleared by MD Diet: diabetic diet--limit fluids to 2000 cc/day. Avoid juices--this is sugar that you are consuming.  --Drink protein shakes/drinks instead (you can make this with almond milk, collagen or protein powder, fruit and veggies), unsalted nuts, greek yogurt, swiss or mozzarella cheese as supplements twice a day between meals.  Wound Care: Change canister when it starts getting more than 75 % full. Wound VAC changes    Functional status:  ___ No restrictions     ___ Walk up steps independently _X__ 24/7 supervision/assistance   ___ Walk up steps with assistance ___ Intermittent supervision/assistance  ___ Bathe/dress independently ___ Walk with walker     _X__ Bathe/dress with assistance ___ Walk Independently    ___ Shower independently _X__ Walk with assistance    ___ Shower with assistance ___ No alcohol     ___ Return to work/school ________  Special Instructions: Need to sit up for 2-3 hours 3-4 times a day. Use ice or heat to lower back/leg for pain. Boost every 20-30 minutes when in chair and side lie when in bed. Keep your blood sugars around 100 (80-100 is the goal) and make sure to eat extra protein 2-3 times a day to promote wound healing, Check blood sugar before meals and at bedtime till you follow up with your PCP.   COMMUNITY REFERRALS UPON DISCHARGE:    Home Health:   RN (PICC line care and weekly labs)                Agency:[facility]  Phone:[phone] *Start of care will determine if services are needed pending ID appointment.   Outpatient: PT                     Agency:[facility]              Appointment Date/Time:*please expect follow-up within 7-10 business days to schedule your home visit. If you have not received follow-up, be  sure to contact the site directly.*  Medical Equipment/Items Ordered: Cefazolin (IV abx) through 12/29 pending appointment with follow-up with ID                                                 Agency/Supplier: [facility] [phone]  Medical Equipment/Items Ordered:wheelchair, bariatric RW and 3in1 BSC; [employer] to deliver 24x18 w/c to your home. Be sure to follow-up to check status                                                 Agency/Supplier:[facility] [phone]  Medical Equipment/Items Ordered: wound VAC                                                 Agency/Supplier: [facility] [phone]  Medical Equipment/Items Ordered: 16 fr catheters  Agency/Supplier: [employer] [phone]  GENERAL COMMUNITY RESOURCES FOR PATIENT/FAMILY: [facility] [phone]: [phone])  Appt scheduled for: Thursday, December 1st at  12:45pm with Dr. Welton Flakes [place], [place] [address] [phone] *Request wound vac changes on Tuesday and Friday*   My questions have been answered and I understand these instructions. I will adhere to these goals and the provided educational materials after my discharge from the hospital.  Patient/Caregiver Signature _______________________________ Date __________  Clinician Signature _______________________________________ Date __________  Please bring this form and your medication list with you to all your follow-up doctor's appointments.

## 2021-02-19 NOTE — Progress Notes (Signed)
Pt refuses CPAP 

## 2021-02-19 NOTE — Progress Notes (Signed)
Occupational Therapy Session Note  Patient Details  Name: [name] MRN: [id number] Date of Birth: 18-Nov-1965  Today's Date: 02/19/2021 OT Individual Time: 0700-0755 OT Individual Time Calculation (min): 55 min    Short Term Goals: Week 2:  OT Short Term Goal 1 (Week 2): STG=LTG 2/2 ELOS (continue working towards supervision/mod I LTG) Week 3:  OT Short Term Goal 1 (Week 3): STG=LTG 2/2 ELOS (continue working towards supervision/mod I LTG)  Skilled Therapeutic Interventions/Progress Updates:    Pt resting in bed upon arrival and agreeable to getting OOB. See below for pain and interventions. Supine>sit EOB with supervision using bed functions. Pt amb with RW to w/c with supervision for equipment mgmt. Pt completed bathing with sit<>stand from w/c at sink. Pt amb with RW to EOB and then to recliner. Sit<>stand and standing balance mod I. Pt requires rest breaks between transitions 2/2 pain. Pt remained seated in recliner with all needs within reach. Seat alarm activated.  Therapy Documentation Precautions:  Precautions Precautions: Fall, Back, Other (comment) Precaution Booklet Issued: No Precaution Comments: wound vac, pt able to recall back precautions Required Braces or Orthoses: Other Brace Other Brace: no brace needed per order, d/t wound vac Restrictions Weight Bearing Restrictions: No Pain: Pt reports 9/10 back pain; meds admin during session, myofascial release, repositioned Therapy/Group: Individual Therapy  [name] 02/19/2021, 8:02 AM

## 2021-02-19 NOTE — Progress Notes (Addendum)
Physical Therapy Session Note  Patient Details  Name: [name] MRN: [id number] Date of Birth: 07-29-1965  Today's Date: 02/19/2021 PT Individual Time: 1615-1700 PT Individual Time Calculation (min): 45 min   Short Term Goals: Week 3:  PT Short Term Goal 1 (Week 3): =LTG due to ELOS  Skilled Therapeutic Interventions/Progress Updates:  Pt received seated EOB with NT getting ready to go to the bathroom. This therapist takes over for NT. Pt is at mod I level for sit to stand with RW during session. Ambulatory transfer into bathroom with RW and Supervision. Toilet transfer with mod I and RW. Pt is continent of bowel, see Flowsheet for details. Pt missed 15 min of scheduled therapy session to allow time for toileting. Pt is dependent for pericare, setup A for clothing management. Ambulation x 160 ft with RW and Supervision. Pt declines to perform stairs or car transfer this date as he has performed them in previous sessions and feels comfortable with current level. Pt reports ongoing back pain, not rated and declines intervention during session. Provided HEP for patient, see below. Pt returned to bed with min A for LE management. Pt left in L sidelying in bed with needs in reach, bed alarm in place, family present at end of session.  Access Code: [email or web address] URL: [email or web address] Date: 02/19/2021 Prepared by: Excell Seltzer  Exercises Standing March with Counter Support - 1 x daily - 7 x weekly - 3 sets - 10 reps Standing Hip Abduction with Unilateral Counter Support - 1 x daily - 7 x weekly - 3 sets - 10 reps Mini Squat with Counter Support - 1 x daily - 7 x weekly - 3 sets - 10 reps Seated March - 1 x daily - 7 x weekly - 3 sets - 10 reps Seated Long Arc Quad - 1 x daily - 7 x weekly - 3 sets - 10 reps  Therapy Documentation Precautions:  Precautions Precautions: Fall, Back, Other (comment) Precaution Booklet Issued: No Precaution Comments: wound vac, pt able to recall  back precautions Required Braces or Orthoses: Other Brace Other Brace: no brace needed per order, d/t wound vac Restrictions Weight Bearing Restrictions: No General: PT Amount of Missed Time (min): 15 Minutes PT Missed Treatment Reason: Toileting     Therapy/Group: Individual Therapy  Excell Seltzer, PT, DPT, CSRS  02/19/2021, 5:15 PM

## 2021-02-20 ENCOUNTER — Other Ambulatory Visit (HOSPITAL_COMMUNITY): Payer: Self-pay

## 2021-02-20 ENCOUNTER — Telehealth: Payer: Self-pay

## 2021-02-20 LAB — GLUCOSE, CAPILLARY
Glucose-Capillary: 105 mg/dL — ABNORMAL HIGH (ref 70–99)
Glucose-Capillary: 119 mg/dL — ABNORMAL HIGH (ref 70–99)
Glucose-Capillary: 121 mg/dL — ABNORMAL HIGH (ref 70–99)
Glucose-Capillary: 81 mg/dL (ref 70–99)
Glucose-Capillary: 94 mg/dL (ref 70–99)

## 2021-02-20 NOTE — Consult Note (Signed)
[place] Nurse wound follow up Patient receiving care in [facility] [id number] Scheduled for discharging today with patient going to [facility] wound care center twice a week for vac dressing changes.  Wound type: Surgical lumbar wound Wound bed: Seeing some shiny pink tissue this AM with less yellow fibrinous tissue scattered throughout. Since adding the white foam the bleeding with dressing removal has decreased a great amount. Still some blistering around the wound but seems to be improving with applying the non-adherent gauze on the areas. The blistering above the proximal end of the wound is healing. New blister on the right side where the foam is bridged. Applied a small piece of Aquacel Advantage over the wound on the right side and non-adherent gauze over the upper wounds on the right side. Drainage (amount, consistency, odor) Sanguinous  in the canister.  Procedure/placement/frequency: 2 pieces of black foam removed. 2 pieced of white foam removed. Used 27M skin barrier wipes on the surrounding skin. 2 pieces of white foam used first in the base of the wound then covered with 1 large piece of black foam. Bridged over to the right of the wound using 1 piece of black foam. Non-adherent gauze placed over the blister on the right side. 2 pieces of black foam used all together. Drape applied, used Tegaderm for extra drape needed, immediate suction obtained at 125 mmHg. Patient sits up on the side of the bed for the dressing change. Premedication needed prior to procedure.  Patient discharging today. WOC will sign off but are available to this patient and medical team if needed prior to discharge.   Cathlean Marseilles Tamala Julian, MSN, RN, [place], Lysle Pearl, [facility] Wound Treatment Associate Pager [phone]

## 2021-02-20 NOTE — Progress Notes (Signed)
Patient is refusing CPAP at this time.

## 2021-02-20 NOTE — Progress Notes (Signed)
Patient ID: [name], male   DOB: 1965/11/21, 55 y.o.   MRN: [id number]  SW spoke with Tracy/[employer] to discuss wound vac. Reports wound vac should be here by lunch.   SW spoke with pt wife who reported concerns related to taking patient home. Would like SW to contact [facility] to see if bed availability. SW left message for DJ/Admissions Coordination with [facility] [phone]).   SW updated Pam/[facility] on possible delay in d/c. SW will update on if medications should be prepared for pt d.c,   *SW spoke with DJ/Admissions Coord. to explain pt situation. Encouraged SW to speak with Anderson Malta Hawkins/liaison [phone]). SW spoke with Anderson Malta to discuss. Reports she will review patient chart, and  follow-up with SW.   Pt extended bed at [facility] per Jenn/liaison. Waiting on insurance auth. SW discussed with pt and family. Pt amenable to wait to see if able to get approved by insurance. SW updated Pam/[facility] and will provide updates once available. SW updated medical team.   Loralee Pacas, MSW, [place] Office: [phone] Cell: [phone] Fax: [phone]

## 2021-02-20 NOTE — Telephone Encounter (Signed)
PA for Hydromorphone cancelled. This medication or product is on your plan's list of covered drugs.

## 2021-02-20 NOTE — Progress Notes (Signed)
[place] PHYSICAL MEDICINE & REHABILITATION PROGRESS NOTE  Subjective/Complaints:  Pt learned to cath- wife and pt worried about going home with wound VAC, IV ABX, continued pain and weakness- as well as urinary retention and needing to cath-  Asking to go to [facility] from here to get more manageable.   Trigger point injections helped a lot.  ROS:   Pt denies SOB, abd pain, CP, N/V/C/D, and vision changes    Objective: Vital Signs: Blood pressure 135/77, pulse (!) 105, temperature 98.5 F (36.9 C), temperature source Oral, resp. rate 19, height '6\' 2"'  (1.88 m), weight 135.6 kg, SpO2 94 %. No results found. Recent Labs    02/18/21 0410  WBC 7.7  HGB 8.9*  HCT 27.7*  PLT 223    Recent Labs    02/18/21 0410  NA 135  K 4.3  CL 100  CO2 28  GLUCOSE 94  BUN 8  CREATININE 0.93  CALCIUM 8.4*    Intake/Output Summary (Last 24 hours) at 02/20/2021 2011 Last data filed at 02/20/2021 1853 Gross per 24 hour  Intake 720 ml  Output 2325 ml  Net -1605 ml        Physical Exam: BP 135/77 (BP Location: Left Arm)   Pulse (!) 105   Temp 98.5 F (36.9 C) (Oral)   Resp 19   Ht '6\' 2"'  (1.88 m)   Wt 135.6 kg   SpO2 94%   BMI 38.38 kg/m       General: awake, alert, appropriate, sitting up in bedside chair; NAD HENT: conjugate gaze; oropharynx moist CV: regular rate; no JVD Pulmonary: CTA B/L; no W/R/R- good air movement GI: soft, NT, ND, (+)BS Psychiatric: appropriate; scared; frustrated Neurological: Ox3  Musculoskeletal: TTP, but less so R low back- palpable trigger points Skin: Warm and dry. VAC in place- suctioning working/in place- looks similar Musc: Neuro: Alert Motor: Bilateral upper extremities: 5/5 proximal distal LE- HF 4/5; KE/KF 4+/5; Df/PF 4+/5  Sensation intact   Assessment/Plan: 1. Functional deficits which require 3+ hours per day of interdisciplinary therapy in a comprehensive inpatient rehab setting. Physiatrist is providing close team  supervision and 24 hour management of active medical problems listed below. Physiatrist and rehab team continue to assess barriers to discharge/monitor patient progress toward functional and medical goals   Care Tool:  Bathing    Body parts bathed by patient: Right arm, Left arm, Chest, Abdomen, Front perineal area, Right upper leg, Left upper leg, Face, Buttocks, Right lower leg, Left lower leg   Body parts bathed by helper: Buttocks, Right lower leg, Left lower leg     Bathing assist Assist Level: Supervision/Verbal cueing     Upper Body Dressing/Undressing Upper body dressing   What is the patient wearing?: Pull over shirt    Upper body assist Assist Level: Independent    Lower Body Dressing/Undressing Lower body dressing      What is the patient wearing?: Pants     Lower body assist Assist for lower body dressing: Independent with assitive device     Toileting Toileting    Toileting assist Assist for toileting: Independent with assistive device     Transfers Chair/bed transfer  Transfers assist  Chair/bed transfer activity did not occur: Safety/medical concerns  Chair/bed transfer assist level: Independent with assistive device Chair/bed transfer assistive device: Programmer, multimedia   Ambulation assist   Ambulation activity did not occur: Safety/medical concerns  Assist level: Supervision/Verbal cueing Assistive device: Walker-rolling Max distance: 160'  Walk 10 feet activity   Assist  Walk 10 feet activity did not occur: Safety/medical concerns  Assist level: Supervision/Verbal cueing Assistive device: Walker-rolling   Walk 50 feet activity   Assist Walk 50 feet with 2 turns activity did not occur: Safety/medical concerns  Assist level: Supervision/Verbal cueing Assistive device: Walker-rolling    Walk 150 feet activity   Assist Walk 150 feet activity did not occur: Safety/medical concerns  Assist level:  Supervision/Verbal cueing Assistive device: Walker-rolling    Walk 10 feet on uneven surface  activity   Assist Walk 10 feet on uneven surfaces activity did not occur: Safety/medical concerns         Wheelchair     Assist Is the patient using a wheelchair?: Yes Type of Wheelchair: Manual Wheelchair activity did not occur: Safety/medical concerns  Wheelchair assist level: Total Assistance - Patient < 25% Max wheelchair distance: 150'    Wheelchair 50 feet with 2 turns activity    Assist    Wheelchair 50 feet with 2 turns activity did not occur: Safety/medical concerns   Assist Level: Total Assistance - Patient < 25%   Wheelchair 150 feet activity     Assist  Wheelchair 150 feet activity did not occur: Safety/medical concerns   Assist Level: Total Assistance - Patient < 25%    Medical Problem List and Plan: 1.  Lumbar radiculopathy/myelopathy secondary to nerve root compression- s/p decompression and wash out after bacteremia from klebsiella pneumonia 11/7- con't PT and OT- pt hasn't learned to cath yet- explained he HAS to today- con't therapies and family training. 11/9- question of LTACH discharge?- has learned cathing 2.  Impaired mobility: continue Lovenox  Resume lovenox given that pt not undergoing IR procedure  Dr Zada Finders to f/u  11/2- will need after d/c for a total of 2 months from original surgery. 11/7- will order lovenox injection kit- needs to do at home   11/9- will need lovenox for a total of 2 months from last surgery             -antiplatelet therapy: N/A 3. Postoperative pain: Continue Oxycodone 15 mg prn. Resumed MSContin for more consistent pain relief. Scheduled Tizanidine TID. Resumed muscle rub to right thigh as it was helping before.  10/27- will add valium 5 mg nightly for muscle spasms  10/28- feels like pain a little better- reminded has IV Dilaudid for VAC changes- will take  11/1- will increase MS Contin to 30 mg  BID  11/3- lidocaine patches helpful- will go over risks/benefits of trigger point  11/8- trigger point injections today for pain control- will increase gabapentin to 800 mg TID  Monitor with increased exertion 4. Mood: Team to provide ego support. LCSW to follow for evaluation and support.              -antipsychotic agents: N/A 5. Neuropsych: This patient is capable of making decisions on his own behalf. 6. Skin/Wound Care:  Monitor wound daily. Added vitamins and protein supplement to help promote wound healing.  7. Fluids/Electrolytes/Nutrition: Monitor I/Os 8. Klebsiella bacteremia w/diskitis: On Cefazolin 2 g every 8 hrs with end date 03/11/21 -- Check weekly CRP/ESR.   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  11/7- ESR 77- much better and CRP down to 7- again, better- con't regimen 11/9- needs labs weekly sent to ID 9. HTN: Monitor BP tid--continue Microzide and cozaar daily.  10/24- d/c HCTZ due to low Na   Vitals:   02/20/21 0408 02/20/21 1526  BP: 122/76 135/77  Pulse: 97 (!) 105  Resp: 18 19  Temp: 98.5 F (36.9 C) 98.5 F (36.9 C)  SpO2: 96% 94%   122/8- BP controlled- con't regimen 10. T2DM with hyperglycemia: CM diet. On Metformin 1000 mg bid  10/26- BG's 140s-160s usually- con't  regimen  10/29- somehow CBGs aren't being done- reordered  10/30- BG's 140s-160s usually- con't regimen  CBG (last 3)  Recent Labs    02/20/21 0406 02/20/21 1156 02/20/21 1615  GLUCAP 81 119* 94   11/9- BG's controlled- con't regimen 11. Neurogenic bladder: with urinary retention On flomax. Increased to 0.8 mg nightly   10/24- still not voiding- will check U/A and Cx 10/26- no voiding- U/A (-)  111/3- explained to pt he also needs to learn to cath- if wife isn't home-will ask nursing to teach.    11/7- wife insists pt will need to cath- asked OT to get mirror to teach  11/9- pt has learned to cath at least 1x 13. Neuropathy: Continue Neurontin 600 mg TID.  14. ABLA: monitor for signs of bleeding. Hgb stabilizing to 8 range.  Hb 8.9 on 10/21  10/28- Hb down to 7.7- which might be lower, since kidney function is worse- will recheck in AM  10/29- Hb 7.5- likely from hemodilution and wound VAC- con't to recheck - Monday  11/3- pt Hb down to 7.3- pulse is 104- and symptomatic- will recheck in AM, but will recheck in AM- will likely need transfusion tomorrow or Saturday.   11/4- actually, had episode where BP dropped to 79/40s yesterday with standing and HR up to 124- transfused 1 unit- Hb back up to 8.5-   11/7- Hb up to 8.9- con't to monitor Repeat Hgb in am 11/6 Cont to monitor 15. 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 -- Follow up with surgery after discharge.  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  17. Abdominal pain: Much better--dysesthesias?  18. Hypomagnesemia: Resumed Mag Ox.   Mag 1.6 on 10/21, labs ordered for Monday  10/24- Mg 2.1  10/29- Mg 2.1- monitor 19. Hyponatremia  Na+ 132 on 10/21, labs ordered for Monday  10/24- Na down to 128- will do fluid restriction 1500cc- and stop HCTZ  10/25- Na 127- will recheck in AM after off HCTZ and on fluid restriction  10/26- Na up to 129- con't fluid restriction and recheck Friday  10/29- Na  up to 131- recheck in AM 10/31 Na+ up to 134  11/3- Na up to 134  11/4- will reduce fluid restriction to 2000 cc/day  11/7- Na up to 135- doing better- con't 2000cc fluid restriction 20. Hypoalbuminemia  Supplement  initiated on 10/21 21. Azotemia/ARI   10/31 resolved , off IVF  , cont to monitor weekly   22 Constipation  11/8- better- con't regimen 23. Hypokalemia  10/28- will give KCL x2 40 mEq and recheck in AM- also checking Mg since can cause it to be low as well.  10/31 resolved K+ 3.9, off supplementation , repeat BMET in am  24. N/V- improved 11/6  10/30- will give antinausea medicine- KUB (-) except for constipation- has pooped 2x-  Will monitor and might need more cleaning out  11/3- will spread out AM meds to see if helps N/V.    LOS: 20 days A FACE TO FACE EVALUATION WAS PERFORMED  Nautika Cressey 02/20/2021, 8:11 PM

## 2021-02-20 NOTE — Telephone Encounter (Signed)
PA for Diazepam cancelled. This medication or product is on your plan's list of covered drugs.  PA approve for Lidocaine Pad 5%.

## 2021-02-21 LAB — GLUCOSE, CAPILLARY
Glucose-Capillary: 118 mg/dL — ABNORMAL HIGH (ref 70–99)
Glucose-Capillary: 110 mg/dL — ABNORMAL HIGH (ref 70–99)
Glucose-Capillary: 115 mg/dL — ABNORMAL HIGH (ref 70–99)
Glucose-Capillary: 102 mg/dL — ABNORMAL HIGH (ref 70–99)

## 2021-02-21 NOTE — Progress Notes (Signed)
Physical Therapy Session Note  Patient Details  Name: [name] MRN: [id number] Date of Birth: [birth year]  Today's Date: 02/21/2021 PT Individual Time: 1045-1130 PT Individual Time Calculation (min): 45 min   Short Term Goals: Week 3:  PT Short Term Goal 1 (Week 3): =LTG due to ELOS  Skilled Therapeutic Interventions/Progress Updates: Pt presented in bed agreeable to therapy. Pt states pain 8/10 and premedicated. Session focused on funcitonal mobility and ambulation. Pt performed bed mobility with supervision and heavy use of bed features. Upon sitting EOB pt indicated need for bathroom. Pt ambulated with CGA and PTA managing wound vac. Pt able to performed toilet transfer with supervision but required total A for peri-care (+BM). Pt then ambulated to recliner for seated rest break. Once pain manageable pt ambulated ~225f with RW and w/c follow without rest break. Pt continues to demonstrate flexed knees, forward flexed posture, and heavy reliance on BUE. Once pt returned to room pt requesting to return to recliner. Pt left in recliner at end of session with call bell within reach and needs met.      Therapy Documentation Precautions:  Precautions Precautions: Fall, Back, Other (comment) Precaution Booklet Issued: No Precaution Comments: wound vac, pt able to recall back precautions Required Braces or Orthoses: Other Brace Other Brace: no brace needed per order, d/t wound vac Restrictions Weight Bearing Restrictions: Yes General:   Vital Signs: Therapy Vitals Temp: 98.1 F (36.7 C) Pulse Rate: (!) 102 Resp: 18 BP: 122/72 Patient Position (if appropriate): Lying Oxygen Therapy SpO2: 95 % Pain: Pain Assessment Pain Scale: 0-10 Pain Score: 8  Pain Location: Back Pain Orientation: Mid Pain Intervention(s): Heat applied Mobility:   Locomotion :    Trunk/Postural Assessment :    Balance:   Exercises:   Other Treatments:      Therapy/Group: Individual  Therapy  [name] 02/21/2021, 4:13 PM

## 2021-02-21 NOTE — Progress Notes (Signed)
[place] PHYSICAL MEDICINE & REHABILITATION PROGRESS NOTE  Subjective/Complaints:   Pt is pending LTACH placement- waiting on insurance-  Having R thigh muscle cramping and spasms- asked if can do trigger point injections for this- I will today.  2nd day feeling "1/2 way decent" for first time since admission.   However had long episode of vomiting and diarrhea last night at 3am- no reason that he could think of- bowels working well, yellow mucus vomit.   Didn't get IV ABX last night- somehow issue with IV-  Checking on it.    ROS:    Pt denies SOB, abd pain, CP, (+) N/V and Diarrhea- overnight and vision changes     Objective: Vital Signs: Blood pressure 136/81, pulse (!) 107, temperature 98.6 F (37 C), temperature source Oral, resp. rate 16, height '6\' 2"'  (1.88 m), weight 135.6 kg, SpO2 98 %. No results found. No results for input(s): WBC, HGB, HCT, PLT in the last 72 hours.   No results for input(s): NA, K, CL, CO2, GLUCOSE, BUN, CREATININE, CALCIUM in the last 72 hours.   Intake/Output Summary (Last 24 hours) at 02/21/2021 1033 Last data filed at 02/21/2021 0552 Gross per 24 hour  Intake 540 ml  Output 1625 ml  Net -1085 ml        Physical Exam: BP 136/81 (BP Location: Left Arm)   Pulse (!) 107   Temp 98.6 F (37 C) (Oral)   Resp 16   Ht '6\' 2"'  (1.88 m)   Wt 135.6 kg   SpO2 98%   BMI 38.38 kg/m        General: awake, alert, appropriate, sitting up in bedside chair; alone in room; NAD HENT: conjugate gaze; oropharynx moist CV: regular  rhythm; mildly tachycardic rate; no JVD Pulmonary: CTA B/L; no W/R/R- good air movement GI: soft, NT, ND, (+)BS; protuberant- no nausea right now Psychiatric: appropriate Neurological: Ox3  Musculoskeletal: TTP, but less so R low back- palpable trigger points' palpable trigger point in R quad Skin: Warm and dry. VAC in place- suctioning working/in place- looks similar Musc: Neuro: Alert Motor: Bilateral upper  extremities: 5/5 proximal distal LE- HF 4/5; KE/KF 4+/5; Df/PF 4+/5  Sensation intact   Assessment/Plan: 1. Functional deficits which require 3+ hours per day of interdisciplinary therapy in a comprehensive inpatient rehab setting. Physiatrist is providing close team supervision and 24 hour management of active medical problems listed below. Physiatrist and rehab team continue to assess barriers to discharge/monitor patient progress toward functional and medical goals   Care Tool:  Bathing    Body parts bathed by patient: Right arm, Left arm, Chest, Abdomen, Front perineal area, Right upper leg, Left upper leg, Face, Buttocks, Right lower leg, Left lower leg   Body parts bathed by helper: Buttocks, Right lower leg, Left lower leg     Bathing assist Assist Level: Supervision/Verbal cueing     Upper Body Dressing/Undressing Upper body dressing   What is the patient wearing?: Pull over shirt    Upper body assist Assist Level: Independent    Lower Body Dressing/Undressing Lower body dressing      What is the patient wearing?: Pants     Lower body assist Assist for lower body dressing: Independent with assitive device     Toileting Toileting    Toileting assist Assist for toileting: Independent with assistive device     Transfers Chair/bed transfer  Transfers assist  Chair/bed transfer activity did not occur: Safety/medical concerns  Chair/bed transfer assist level:  Independent with assistive device Chair/bed transfer assistive device: Programmer, multimedia   Ambulation assist   Ambulation activity did not occur: Safety/medical concerns  Assist level: Supervision/Verbal cueing Assistive device: Walker-rolling Max distance: 160'   Walk 10 feet activity   Assist  Walk 10 feet activity did not occur: Safety/medical concerns  Assist level: Supervision/Verbal cueing Assistive device: Walker-rolling   Walk 50 feet activity   Assist Walk 50  feet with 2 turns activity did not occur: Safety/medical concerns  Assist level: Supervision/Verbal cueing Assistive device: Walker-rolling    Walk 150 feet activity   Assist Walk 150 feet activity did not occur: Safety/medical concerns  Assist level: Supervision/Verbal cueing Assistive device: Walker-rolling    Walk 10 feet on uneven surface  activity   Assist Walk 10 feet on uneven surfaces activity did not occur: Safety/medical concerns         Wheelchair     Assist Is the patient using a wheelchair?: Yes Type of Wheelchair: Manual Wheelchair activity did not occur: Safety/medical concerns  Wheelchair assist level: Total Assistance - Patient < 25% Max wheelchair distance: 150'    Wheelchair 50 feet with 2 turns activity    Assist    Wheelchair 50 feet with 2 turns activity did not occur: Safety/medical concerns   Assist Level: Total Assistance - Patient < 25%   Wheelchair 150 feet activity     Assist  Wheelchair 150 feet activity did not occur: Safety/medical concerns   Assist Level: Total Assistance - Patient < 25%    Medical Problem List and Plan: 1.  Lumbar radiculopathy/myelopathy secondary to nerve root compression- s/p decompression and wash out after bacteremia from klebsiella pneumonia 11/7- con't PT and OT- pt hasn't learned to cath yet- explained he HAS to today- con't therapies and family training. 11/9- question of LTACH discharge?- 11/10- holding d/c- hopeful with all of the medical and nursing needs pt has, and no H/H, that we can get to [facility]- discharged from PT and OT   2.  Impaired mobility: continue Lovenox  Resume lovenox given that pt not undergoing IR procedure  Dr Zada Finders to f/u  11/2- will need after d/c for a total of 2 months from original surgery. 11/7- will order lovenox injection kit- needs to do at home   11/9- will need lovenox for a total of 2 months from last surgery             -antiplatelet therapy:  N/A 3. Postoperative pain: Continue Oxycodone 15 mg prn. Resumed MSContin for more consistent pain relief. Scheduled Tizanidine TID. Resumed muscle rub to right thigh as it was helping before.  10/27- will add valium 5 mg nightly for muscle spasms  10/28- feels like pain a little better- reminded has IV Dilaudid for VAC changes- will take  11/1- will increase MS Contin to 30 mg BID  11/3- lidocaine patches helpful- will go over risks/benefits of trigger point  11/8- trigger point injections today for pain control- will increase gabapentin to 800 mg TID 11/10- helpful- feels like trigger points in back breaking up but now has in R thigh- almost fell due to spasm yesterday- will do trigger point injections in R thigh today.   Monitor with increased exertion 4. Mood: Team to provide ego support. LCSW to follow for evaluation and support.              -antipsychotic agents: N/A 5. Neuropsych: This patient is capable of making decisions on his own behalf. 6.  Skin/Wound Care:  Monitor wound daily. Added vitamins and protein supplement to help promote wound healing.  7. Fluids/Electrolytes/Nutrition: Monitor I/Os 8. Klebsiella bacteremia w/diskitis: On Cefazolin 2 g every 8 hrs with end date 03/11/21 -- Check weekly CRP/ESR.   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  11/7- ESR 77- much better and CRP down to 7- again, better- con't regimen 11/9- needs labs weekly sent to ID 9. HTN: Monitor BP tid--continue Microzide and cozaar daily.  10/24- d/c HCTZ due to low Na   Vitals:   02/20/21 2012 02/21/21 0344  BP: 115/63 136/81  Pulse: (!) 104 (!) 107  Resp: 18 16  Temp: 98.5 F (36.9 C) 98.6 F (37 C)  SpO2: 96% 98%   11/10-HR a little elevated, but BP controlled- con't regimen 10. T2DM with hyperglycemia: CM diet. On Metformin 1000 mg bid  10/26- BG's 140s-160s usually- con't regimen  10/29- somehow CBGs aren't being done- reordered  10/30- BG's 140s-160s usually- con't regimen  CBG (last 3)  Recent Labs    02/20/21 1615 02/20/21 2106 02/21/21 0641  GLUCAP 94 121* 118*   11/9- BG's controlled- con't regimen 11. Neurogenic bladder: with urinary retention On flomax. Increased to 0.8 mg nightly   10/24- still not voiding- will check U/A and Cx 10/26- no voiding- U/A (-)  111/3- explained to pt he also needs to learn to cath- if wife isn't home-will ask nursing to teach.    11/7- wife insists pt will need to cath- asked OT to get mirror to teach  11/9- pt has learned to cath at least 1x 13. Neuropathy: Continue Neurontin 600 mg TID.  14. ABLA: monitor for signs of bleeding. Hgb stabilizing to 8 range.  Hb 8.9 on 10/21  10/28- Hb down to 7.7- which might be lower, since kidney function is worse- will recheck in AM  10/29- Hb 7.5- likely from hemodilution and wound VAC- con't to recheck - Monday  11/3- pt Hb down to 7.3- pulse is 104- and symptomatic- will recheck in AM, but will recheck in AM- will likely need transfusion tomorrow or Saturday.   11/4- actually, had episode where BP dropped to 79/40s yesterday with standing and HR up to 124- transfused 1 unit- Hb back up to 8.5-   11/7- Hb up to 8.9- con't to monitor Repeat Hgb in am 11/6 Cont to monitor 15. Enlarged spleen: Asymptomatic- had  exposure to EBV in the past. -- Follow up with surgery after discharge.  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  17. Abdominal pain: Much better--dysesthesias?  18. Hypomagnesemia: Resumed Mag Ox.   Mag 1.6 on 10/21, labs ordered for Monday  10/24- Mg  2.1  10/29- Mg 2.1- monitor 19. Hyponatremia  Na+ 132 on 10/21, labs ordered for Monday  10/24- Na down to 128- will do fluid restriction 1500cc- and stop HCTZ  10/25- Na 127- will recheck in AM after off HCTZ and on fluid restriction  10/26- Na up to 129- con't fluid restriction and recheck Friday  10/29- Na up to 131- recheck in AM 10/31 Na+ up to 134  11/3- Na up to 134  11/4- will reduce fluid restriction to 2000 cc/day  11/7- Na up to 135- doing better- con't 2000cc fluid restriction 20. Hypoalbuminemia  Supplement initiated on 10/21 21. Azotemia/ARI   10/31 resolved , off IVF  , cont to monitor weekly   22 Constipation  11/8- better- con't regimen 23. Hypokalemia  10/28- will give KCL x2 40 mEq and recheck in AM- also checking Mg since can cause it to be low as well.  10/31 resolved K+ 3.9, off supplementation , repeat BMET in am  24. N/V- improved 11/6  10/30- will give antinausea medicine- KUB (-) except for constipation- has pooped 2x-  Will monitor and might need more cleaning out  11/3- will spread out AM meds to see if helps N/V.   11/10- still having intermittently- N/V- and diarrhea- IV ABX?? Will d/w pharmacy  I spent a total of 45 minutes on total care- >50% on coordination-  d/w pharmacy about IV ABX timing as well as trigger point injections.     LOS: 21 days A FACE TO FACE EVALUATION WAS PERFORMED  Dalene Robards 02/21/2021, 10:33 AM

## 2021-02-21 NOTE — Progress Notes (Signed)
Pt educated on blood sugar and diabetes. Pt instructed to avoid drinking orange juice. Also educated pt on fluid restriction. Pt in agreement. Ice chips given to pt Sheela Stack, LPN

## 2021-02-21 NOTE — Progress Notes (Addendum)
Occupational Therapy Session Note  Patient Details  Name: [name] MRN: [id number] Date of Birth: [birth year]  Today's Date: 02/21/2021 OT Individual Time: 0700-0810 OT Individual Time Calculation (min): 70 min    Short Term Goals: Week 3:  OT Short Term Goal 1 (Week 3): STG=LTG 2/2 ELOS (continue working towards supervision/mod I LTG)  Skilled Therapeutic Interventions/Progress Updates:    Pt resting in bed upon arrival. Pt requested to use toilet and amb with RW to bathroom with assistance managing IV and wound vac. Toilet transfer and toileting with mod I. Pt completed bathing with sit<>stand from w/c at sink. Pt requires rest breaks 2/2 increased pain with activity. Amb in room with RW without assistance except for managing IV. Sit<>stand X 6 mod I with RW. Pt requested to sit in recliner. All needs within reach. Seat alarm activated. Pt awaiting decision from insurance regarding discharge to [facility] ([facility]).   Therapy Documentation Precautions:  Precautions Precautions: Fall, Back, Other (comment) Precaution Booklet Issued: No Precaution Comments: wound vac, pt able to recall back precautions Required Braces or Orthoses: Other Brace Other Brace: no brace needed per order, d/t wound vac Restrictions Weight Bearing Restrictions: Yes  Pain:  Pt c/o 7/10 Rt lower back pain; repositioned, MFR, Muslce Rub applied  Therapy/Group: Individual Therapy  [name] 02/21/2021, 8:12 AM

## 2021-02-21 NOTE — Progress Notes (Signed)
Pt refused CPAP this evening. RT will cont to monitor.
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
## 2021-02-21 NOTE — Progress Notes (Addendum)
Patient ID: DAO MEMMOTT, male   DOB: 1966-02-12, 55 y.o.   MRN: [id number]  SW made contact with Jennifer/liaison with [facility] to see if any new updates. Reports still waiting on updates.   SW faxed outpatient PT referral to [facility].  SW order 6fr catheters for pt with [employer].   Loralee Pacas, MSW, [place] Office: [phone] Cell: [phone] Fax: [phone]

## 2021-02-22 ENCOUNTER — Other Ambulatory Visit (HOSPITAL_COMMUNITY): Payer: Self-pay

## 2021-02-22 ENCOUNTER — Telehealth: Payer: Self-pay

## 2021-02-22 LAB — GLUCOSE, CAPILLARY
Glucose-Capillary: 102 mg/dL — ABNORMAL HIGH (ref 70–99)
Glucose-Capillary: 99 mg/dL (ref 70–99)
Glucose-Capillary: 114 mg/dL — ABNORMAL HIGH (ref 70–99)
Glucose-Capillary: 103 mg/dL — ABNORMAL HIGH (ref 70–99)

## 2021-02-22 NOTE — Progress Notes (Signed)
Occupational Therapy Note  Patient Details  Name: [name] MRN: [id number] Date of Birth: [birth year]  Today's Date: 02/22/2021 OT Missed Time: 72 Minutes Missed Time Reason: Patient fatigue;Pain  Pt missed 30 mins skilled OT services. Pt resting/sleeping following wound care and wound vac care. Will attempt to see pt later in day.    Leotis Shames [facility] 02/22/2021, 12:07 PM
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
## 2021-02-22 NOTE — Progress Notes (Signed)
Pt refused CPAP this evening. RT will cont to monitor.

## 2021-02-22 NOTE — Telephone Encounter (Signed)
MS Contin ER 30 mg  denied  (A) Treatment goals are defined and include estimated duration of treatment(treatment goals must be provided)  (B) Unless it is contraindicated, you have not exhibited an adequate response to at least six weeks of treatment with a generic tricyclic antidepressant titrated to the maximum tolerated dose(drug and date of trial must be provided).  (C) You have been screened for underlying depression and/or anxiety. If applicable, any underlying conditions have been or will be addressed.

## 2021-02-22 NOTE — Progress Notes (Addendum)
Patient ID: [name], male   DOB: [birth year], 55 y.o.   MRN: [id number]  SW received updates from Jennifer/liaison with [facility] who indicated insurance denied as pt did not need another level of care and can remain at current hospital' peer to peer is an option. SW updated medical team. Attending to complete peer to peer [phone].  SW updated Pam[facility] with updates.   SW met with pt wife to inform on above and will provide updates once available.   SW left message for [facility] [phone]: [phone]) requesting appointment be moved to Wednesday, November 16 since waiting on insurance to give an answer.   *1530-SW received updates from attending that after P2P it was determined pt did not meet criteria for LTACH due to no trach and not daily dressing changes required. Attending reports pt can d/c on Monday. SW informed [name] on above decision. Reports will take patient home on Monday (02/25/21) with services in place. SW updated medical team, and Pam[facility] on changes.   Loralee Pacas, MSW, [place] Office: [phone] Cell: [phone] Fax: [phone]

## 2021-02-22 NOTE — Consult Note (Signed)
[facility] Nurse wound follow up Patient receiving care in [facility] [phone] Waiting for [facility] insurance approval.  Wound type: Surgical lumbar wound Wound bed: See photos from today in Media tab taken by Reesa Chew. PA-C Measurements: 21 x 6.3 x (4 proximally), 2.7 mid, and (4.1 at the distal end of the wound).  Drainage (amount, consistency, odor) Sanguinous  in the canister. Periwound: still having issues with surrounding bulla which I have placed nonadherent dressings over prior to placing the drape. Bulla at the proximal end of the wound are healing.  Dressing procedure/placement/frequency: 2 pieces of black foam and 2 pieces of white foam removed. Used 1M skin barrier wipes on the total surrounding surface of the skin including the bridged area. 2 pieces of white foam used first in the wound then covered with 1 large piece of black foam. Bridged over to the right of the wound using 1 piece of black foam. 2 pieces of non-adherent gauze opened placed over the skin in the bridging area, Tegaderm used to hold in place. Drape applied, immediate suction obtained at 125 mmHg. Patient sits up on the side of the bed for the dressing change. Premedication needed prior to procedure.  Supplies at bedside in top drawer which includes 1 large black foam kit, 2 white foams, Tegaderm, non-adherent gauze.    [place] following M/W/F   Cathlean [place]se[place]. Tamala Julian, MSN, RN, [place], Lysle Pearl, [facility] Wound Treatment Associate Pager [phone]

## 2021-02-22 NOTE — Progress Notes (Signed)
[facility] consult received to flush and disconnect PICC line from fluids. Education provided to patient's nurse that every time we disconnect PICC from fluids/meds we increase risk for infection. Further advised we are happy to disconnect line during the day when patient is participating in physical therapy, but prefer to leave it connected otherwise. Elmyra Ricks, LPN verbalized understanding.

## 2021-02-22 NOTE — Progress Notes (Signed)
Patient continues to have copious wound drainage. No odor and serosanguinous drainage noted.  Wound bed with some fibrinous tissue as edges. Has sensitive skin with irritation due to adhesive that is being managed with skin prep.
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
## 2021-02-22 NOTE — Telephone Encounter (Signed)
Oxycodone cancelled.  This medication or product is on your plan's list of covered drugs.

## 2021-02-22 NOTE — Progress Notes (Signed)
Physical Therapy Session Note  Patient Details  Name: [name] MRN: [id number] Date of Birth: [birth year]  Today's Date: 02/22/2021 PT Individual Time: 1400-1435 PT Individual Time Calculation (min): 35 min   Short Term Goals: Week 1:  PT Short Term Goal 1 (Week 1): Pt will tolerate OOB activity x 30 min PT Short Term Goal 1 - Progress (Week 1): Met PT Short Term Goal 2 (Week 1): Pt will perform standing transfers with consistent CGA. PT Short Term Goal 2 - Progress (Week 1): Met PT Short Term Goal 3 (Week 1): Pt will ambulate at least 30 feet consistently with CGA and LRAD. PT Short Term Goal 3 - Progress (Week 1): Met Week 2:  PT Short Term Goal 1 (Week 2): =LTG due to ELOS PT Short Term Goal 1 - Progress (Week 2): Progressing toward goal Week 3:  PT Short Term Goal 1 (Week 3): =LTG due to ELOS  Skilled Therapeutic Interventions/Progress Updates:    pt received in bed and agreeable to therapy. Pt c/o of high levels of pain throughout session d/t wound vac change earlier in day, unrated and premedicated. Supine>sit with supervision, pt able to direct care to use bed features for ease of transfer. CGA for Sit to stand throughout session. Therapist managed woundvac during gait, 2 x 3 and x 4 laps around room, supervision with RW. Pt demoes hunched posture and flexed hip and knees throughout. Therapist encouraged pt to attempt other exercise, pt declined d/t pain and fatigue. Pt returned to bed with min A for LE management. Pt was left with all needs in reach and alarm active. Pt missed 25 min of scheduled PT.   Therapy Documentation Precautions:  Precautions Precautions: Fall, Back, Other (comment) Precaution Booklet Issued: No Precaution Comments: wound vac, pt able to recall back precautions Required Braces or Orthoses: Other Brace Other Brace: no brace needed per order, d/t wound vac Restrictions Weight Bearing Restrictions: No General: PT Amount of Missed Time (min): 25  Minutes PT Missed Treatment Reason: Pain   Therapy/Group: Individual Therapy  Mickel Fuchs 02/22/2021, 2:40 PM

## 2021-02-23 LAB — GLUCOSE, CAPILLARY
Glucose-Capillary: 93 mg/dL (ref 70–99)
Glucose-Capillary: 125 mg/dL — ABNORMAL HIGH (ref 70–99)
Glucose-Capillary: 98 mg/dL (ref 70–99)
Glucose-Capillary: 118 mg/dL — ABNORMAL HIGH (ref 70–99)

## 2021-02-23 NOTE — Progress Notes (Signed)
Patient had a small bowel movement on toilet with family present. Patient reporting constipation and requests suppository. PRN dulcolax suppository administered at 2018. Rectal vault was clear. No results at this time. Continue to monitor.

## 2021-02-23 NOTE — Progress Notes (Signed)
[place] PHYSICAL MEDICINE & REHABILITATION PROGRESS NOTE  Subjective/Complaints:   Wants to have trigger point injections- prior to d/c- won't appeal LTACH denial.    ROS:   Pt denies SOB, abd pain, CP, N/V/C/D, and vision changes   Objective: Vital Signs: Blood pressure 128/69, pulse 99, temperature 98.4 F (36.9 C), temperature source Oral, resp. rate 16, height _0  (1.88 m), weight 135.6 kg, SpO2 95 %. No results found. No results for input(s): WBC, HGB, HCT, PLT in the last 72 hours.   No results for input(s): NA, K, CL, CO2, GLUCOSE, BUN, CREATININE, CALCIUM in the last 72 hours.   Intake/Output Summary (Last 24 hours) at 02/23/2021 1124 Last data filed at 02/23/2021 0631 Gross per 24 hour  Intake 880 ml  Output 2900 ml  Net -2020 ml        Physical Exam: BP 128/69 (BP Location: Left Arm)   Pulse 99   Temp 98.4 F (36.9 C) (Oral)   Resp 16   Ht _1  (1.88 m)   Wt 135.6 kg   SpO2 95%   BMI 38.38 kg/m          General: awake, alert, appropriate, sitting up in bed; NAD HENT: conjugate gaze; oropharynx moist- conjunctivae pale CV: regular rate; no JVD Pulmonary: CTA B/L; no W/R/R- good air movement GI: soft, NT, ND, (+)BS Psychiatric: appropriate- depressed affect Neurological: Ox3 Musculoskeletal: TTP, but less so R low back- palpable trigger points' palpable trigger point in R quad- is actually tighter; but less tight on R low/mid back just lateral to upper part of VAC- no change Skin: Warm and dry. VAC in place- suctioning working/in place-still working- to be changed this AM Musc: Neuro: Alert Motor: Bilateral upper extremities: 5/5 proximal distal LE- HF 4/5; KE/KF 4+/5; Df/PF 4+/5  Sensation intact   Assessment/Plan: 1. Functional deficits which require 3+ hours per day of interdisciplinary therapy in a comprehensive inpatient rehab setting. Physiatrist is providing close team supervision and 24 hour management of active medical  problems listed below. Physiatrist and rehab team continue to assess barriers to discharge/monitor patient progress toward functional and medical goals   Care Tool:  Bathing    Body parts bathed by patient: Right arm, Left arm, Chest, Abdomen, Front perineal area, Right upper leg, Left upper leg, Face, Buttocks, Right lower leg, Left lower leg   Body parts bathed by helper: Buttocks, Right lower leg, Left lower leg     Bathing assist Assist Level: Supervision/Verbal cueing     Upper Body Dressing/Undressing Upper body dressing   What is the patient wearing?: Pull over shirt    Upper body assist Assist Level: Independent    Lower Body Dressing/Undressing Lower body dressing      What is the patient wearing?: Pants     Lower body assist Assist for lower body dressing: Independent with assitive device     Toileting Toileting    Toileting assist Assist for toileting: Independent with assistive device     Transfers Chair/bed transfer  Transfers assist  Chair/bed transfer activity did not occur: Safety/medical concerns  Chair/bed transfer assist level: Independent with assistive device Chair/bed transfer assistive device: Programmer, multimedia   Ambulation assist   Ambulation activity did not occur: Safety/medical concerns  Assist level: Supervision/Verbal cueing Assistive device: Walker-rolling Max distance: 120'   Walk 10 feet activity   Assist  Walk 10 feet activity did not occur: Safety/medical concerns  Assist level: Supervision/Verbal cueing Assistive device: Walker-rolling  Walk 50 feet activity   Assist Walk 50 feet with 2 turns activity did not occur: Safety/medical concerns  Assist level: Supervision/Verbal cueing Assistive device: Walker-rolling    Walk 150 feet activity   Assist Walk 150 feet activity did not occur: Safety/medical concerns  Assist level: Supervision/Verbal cueing Assistive device: Walker-rolling     Walk 10 feet on uneven surface  activity   Assist Walk 10 feet on uneven surfaces activity did not occur: Safety/medical concerns         Wheelchair     Assist Is the patient using a wheelchair?: Yes Type of Wheelchair: Manual Wheelchair activity did not occur: Safety/medical concerns  Wheelchair assist level: Total Assistance - Patient < 25% Max wheelchair distance: 150'    Wheelchair 50 feet with 2 turns activity    Assist    Wheelchair 50 feet with 2 turns activity did not occur: Safety/medical concerns   Assist Level: Total Assistance - Patient < 25%   Wheelchair 150 feet activity     Assist  Wheelchair 150 feet activity did not occur: Safety/medical concerns   Assist Level: Total Assistance - Patient < 25%    Medical Problem List and Plan: 1.  Lumbar radiculopathy/myelopathy secondary to nerve root compression- s/p decompression and wash out after bacteremia from klebsiella pneumonia 11/7- con't PT and OT- pt hasn't learned to cath yet- explained he HAS to today- con't therapies and family training. 11/9- question of LTACH discharge?- 11/10- holding d/c- hopeful with all of the medical and nursing needs pt has, and no H/H, that we can get to [facility]- discharged from PT and OT 11/11- insurance has denied pt to go to [facility]- am trying to do a peer to peer- pt has SO many nursing and medical needs-with the VAC, the IV ABX; the in/out cathing; the losing blood still during VAC change so his Hb keeps trneding down more s/p 1 unit pRBCS and still tachycardic just sitting up- but also having orthostatic hypotension - also needs lovenox for 2 months. Will d/w Dr Rosendo Gros today  11/12- con't PT and OT- denied from [facility] -will d/c Tuesday- after trigger point injections has appt with me 11/18 2.  Impaired mobility: continue Lovenox  Resume lovenox given that pt not undergoing IR procedure  Dr Zada Finders to f/u  11/2- will need after d/c for a total of 2 months from  original surgery. 11/7- will order lovenox injection kit- needs to do at home   11/9- will need lovenox for a total of 2 months from last surgery             -antiplatelet therapy: N/A 3. Postoperative pain: Continue Oxycodone 15 mg prn. Resumed MSContin for more consistent pain relief. Scheduled Tizanidine TID. Resumed muscle rub to right thigh as it was helping before.  10/27- will add valium 5 mg nightly for muscle spasms  10/28- feels like pain a little better- reminded has IV Dilaudid for VAC changes- will take  11/1- will increase MS Contin to 30 mg BID  11/3- lidocaine patches helpful- will go over risks/benefits of trigger point  11/8- trigger point injections today for pain control- will increase gabapentin to 800 mg TID 11/10- helpful- feels like trigger points in back breaking up but now has in R thigh- almost fell due to spasm yesterday- will do trigger point injections in R thigh today.  11/11- improved after Back injection; more pain in R thigh after pain. 11/12- will redo injection Tuesday before d/c.  Monitor with increased exertion 4. Mood: Team to provide ego support. LCSW to follow for evaluation and support.              -antipsychotic agents: N/A 5. Neuropsych: This patient is capable of making decisions on his own behalf. 6. Skin/Wound Care:  Monitor wound daily. Added vitamins and protein supplement to help promote wound healing.  7. Fluids/Electrolytes/Nutrition: Monitor I/Os 8. Klebsiella bacteremia w/diskitis: On Cefazolin 2 g every 8 hrs with end date 03/11/21 -- Check weekly CRP/ESR.   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  11/7- ESR 77- much better and CRP down to 7- again, better- con't regimen 11/9- needs labs weekly sent to ID 9. HTN: Monitor BP tid--continue Microzide and cozaar daily.  10/24- d/c HCTZ due to low Na   Vitals:   02/22/21 1921 02/23/21 0634  BP: 115/70 128/69  Pulse: 94 99  Resp: 16 16  Temp: 98.6 F (37 C) 98.4 F (36.9 C)  SpO2: 98% 95%   11/10-HR a little elevated, but BP controlled- con't regimen  11/11- having orthostatic hypotension and dizziness but heart rate up- so might need Mododrine? 11/12- doing a little better today with abd binder and TEDs/ACEp wraps 10. T2DM with hyperglycemia: CM diet. On Metformin 1000 mg bid  10/26- BG's 140s-160s usually- con't regimen  10/29- somehow CBGs aren't being done- reordered  10/30- BG's 140s-160s usually- con't regimen  CBG (last 3)  Recent Labs    02/22/21 2123 02/23/21 0625 02/23/21 1117  GLUCAP 103* 93 125*   11/11- BG's controlled- con't regimen 11. Neurogenic bladder: with urinary retention On flomax. Increased to 0.8 mg nightly   10/24- still not voiding- will check U/A and Cx 10/26- no voiding- U/A (-)  111/3- explained to pt he also needs to learn to cath- if wife isn't home-will ask nursing to teach.    11/7- wife insists pt will need to cath- asked OT to get mirror to teach  11/9- pt has learned to cath at least 1x 13. Neuropathy: Continue Neurontin 600 mg TID.  14. ABLA: monitor for signs of bleeding. Hgb stabilizing to 8 range.  Hb 8.9 on 10/21  10/28- Hb down to 7.7- which might be lower, since kidney function is worse- will recheck in AM  10/29- Hb 7.5- likely  from hemodilution and wound VAC- con't to recheck - Monday  11/3- pt Hb down to 7.3- pulse is 104- and symptomatic- will recheck in AM, but will recheck in AM- will likely need transfusion tomorrow or Saturday.   11/4- actually, had episode where BP dropped to 79/40s yesterday with standing and HR up to 124- transfused 1 unit- Hb back up  to 8.5-   11/7- Hb up to 8.9- con't to monitor Repeat Hgb in am 11/6    11/12- labs on Monday Cont to monitor 15. 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 -- Follow up with surgery after discharge.  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  17. Abdominal pain: Much better--dysesthesias?  18. Hypomagnesemia: Resumed Mag Ox.   Mag 1.6 on 10/21, labs ordered for Monday  10/24- Mg 2.1  10/29- Mg 2.1- monitor 19. Hyponatremia  Na+ 132 on 10/21, labs ordered for Monday  10/24- Na down to 128- will do fluid restriction 1500cc- and stop HCTZ  10/25- Na 127- will recheck in AM after off HCTZ and on fluid restriction  10/26- Na up to 129- con't fluid restriction and recheck Friday  10/29- Na up to 131- recheck in AM 10/31 Na+ up to 134  11/3- Na up to 134  11/4- will reduce fluid restriction to 2000 cc/day  11/7- Na up to 135- doing better- con't 2000cc fluid restriction 20. Hypoalbuminemia  Supplement initiated on 10/21 21. Azotemia/ARI   10/31 resolved , off IVF  , cont to monitor weekly   22 Constipation  11/8- better- con't regimen 23. Hypokalemia  10/28- will give KCL x2 40 mEq and recheck in AM- also checking Mg since can cause it to be low as well.  10/31 resolved K+ 3.9, off supplementation , repeat BMET in am  24. N/V- improved 11/6  10/30- will give antinausea medicine- KUB (-) except for constipation- has pooped 2x-  Will monitor and might need more cleaning out  11/3- will spread out AM meds to see if helps N/V.   11/10- still having intermittently- N/V- and diarrhea- IV ABX?? Will d/w pharmacy     LOS:  23 days A FACE TO FACE EVALUATION WAS PERFORMED  Maleena Eddleman 02/23/2021, 11:24 AM

## 2021-02-23 NOTE — Progress Notes (Signed)
Occupational Therapy Session Note  Patient Details  Name: [name] MRN: [id number] Date of Birth: 05/27/1965  Today's Date: 02/24/2021 OT Individual Time: 9678-9381 OT Individual Time Calculation (min): 72 min    Skilled Therapeutic Interventions/Progress Updates:    Pt greeted in bed and premedicated for back pain. Agreeable to engage in ADLs today. Supine<sit completed with setup assist via modified logroll. Pt used the RW at Mod I level to ambulate to the w/c parked in front of the sink. Pt had a very specific way that he wanted his w/c set up. He then engaged in oral care/grooming tasks, UB bathing, and UB/LB dressing tasks at sit<stand level using RW. Pt used AE to meet demands of LB self care to adhere to back precautions. Supervision/setup-Mod I overall.   Therapy Documentation Precautions:  Precautions Precautions: Fall, Back, Other (comment) Precaution Booklet Issued: No Precaution Comments: wound vac, pt able to recall back precautions Required Braces or Orthoses: Other Brace Other Brace: no brace needed per order, d/t wound vac Restrictions Weight Bearing Restrictions: No Pain: Pain Assessment Pain Scale: 0-10 Pain Score: 7  Pain Type: Acute pain;Surgical pain Pain Location: Back Pain Orientation: Mid Pain Descriptors / Indicators: Aching;Dull;Sharp;Throbbing;Spasm Pain Frequency: Constant Pain Onset: On-going Pain Intervention(s): Medication (See eMAR) ADL: ADL Eating: Independent Grooming: Setup Where Assessed-Grooming: Sitting at sink Upper Body Bathing: Setup Where Assessed-Upper Body Bathing: Edge of bed Lower Body Bathing: Moderate assistance Where Assessed-Lower Body Bathing: Edge of bed Where Assessed-Upper Body Dressing: Sitting at sink Lower Body Dressing: Moderate assistance (reacher for pants. needs A with shoes) Toileting: Minimal assistance Where Assessed-Toileting: Toilet, Bedside Commode Toilet Transfer: Minimal assistance Toilet Transfer  Method: Counselling psychologist: Extra wide bedside commode, Grab bars Tub/Shower Transfer: Not assessed  Therapy/Group: Individual Therapy  Jenniffer Vessels A Treyvone Chelf 02/24/2021, 12:53 PM

## 2021-02-23 NOTE — Progress Notes (Signed)
Occupational Therapy Session Note  Patient Details  Name: [name] MRN: [id number] Date of Birth: [birth year]  Today's Date: 02/23/2021 OT Individual Time: 1000-1110 OT Individual Time Calculation (min): 70 min    Short Term Goals: Week 1:  OT Short Term Goal 1 (Week 1): Pt will don B shoes wiht MIN cuing for AE technique OT Short Term Goal 1 - Progress (Week 1): Progressing toward goal OT Short Term Goal 2 (Week 1): Pt will complete ambulatory toilet transfer with CGA consistently + LRAD. OT Short Term Goal 2 - Progress (Week 1): Met OT Short Term Goal 3 (Week 1): Pt will bathe wiht S OT Short Term Goal 3 - Progress (Week 1): Met  Skilled Therapeutic Interventions/Progress Updates:    Pt resting in bed upon arrival and agreeable to getting OOB. OT intervention with focus on functional amb with RW, standing balance, BADLs, bed mobility, and activity tolerance. Pt amb with RW in room with supervision (assistance to manage wound vac). Pt will have small portable wound vac on discharge. All BADLs and standing balance with mod I. Pt with 7/10 Rt lower back pain; MFR for approx 10 mins with relief noted. Muscle Rub applied. Pt remained seated in recliner with all needs within reach.   Therapy Documentation Precautions:  Precautions Precautions: Fall, Back, Other (comment) Precaution Booklet Issued: No Precaution Comments: wound vac, pt able to recall back precautions Required Braces or Orthoses: Other Brace Other Brace: no brace needed per order, d/t wound vac Restrictions Weight Bearing Restrictions: No   Pain: Pain Assessment Pain Scale: 0-10 Pain Score: 7  Pain Type: Surgical pain Pain Location: Back Pain Intervention(s): premedicated, repositioned   Therapy/Group: Individual Therapy  [name] 02/23/2021, 11:14 AM

## 2021-02-23 NOTE — Progress Notes (Signed)
Physical Therapy Note  Patient Details  Name: [name] MRN: [id number] Date of Birth: 11-Apr-1966 Today's Date: 02/23/2021    Pt states in too much pain and unable to participate w/ therapy at this time.  Pt states will possibly be D/C'd to home on Monday.   [name] 02/23/2021, 1:43 PM

## 2021-02-24 LAB — GLUCOSE, CAPILLARY
Glucose-Capillary: 103 mg/dL — ABNORMAL HIGH (ref 70–99)
Glucose-Capillary: 110 mg/dL — ABNORMAL HIGH (ref 70–99)
Glucose-Capillary: 104 mg/dL — ABNORMAL HIGH (ref 70–99)
Glucose-Capillary: 115 mg/dL — ABNORMAL HIGH (ref 70–99)

## 2021-02-24 NOTE — Progress Notes (Signed)
Pt self catheterized twice this shift using mirror and home cath kits. Pt did well and tolerated well, though would benefit from further practice.   Gerald Stabs, RN

## 2021-02-24 NOTE — Progress Notes (Signed)
Patient attempted self cathing this morning. Patient tried twice with assistance but unsuccessful. Education reinforced on maintaining clean technique and proper hand positioning for easier insertion of catheter. Patient would benefit from more education and experimenting what technique will work best for him. Patient cathed for 487ml of clear urine. Will report off to oncoming RN.

## 2021-02-24 NOTE — Plan of Care (Signed)
  Problem: RH Grooming Goal: LTG Patient will perform grooming w/assist,cues/equip (OT) Description: LTG: Patient will perform grooming with assist, with/without cues using equipment (OT) Outcome: Completed/Met

## 2021-02-24 NOTE — Progress Notes (Signed)
Physical Therapy Session Note  Patient Details  Name: [name] MRN: [id number] Date of Birth: 1966/01/27  Today's Date: 02/24/2021 PT Individual Time: 2458-0998 PT Individual Time Calculation (min): 15 min  Missed time: 45 min 2/2 pain. Short Term Goals: Week 3:  PT Short Term Goal 1 (Week 3): =LTG due to ELOS  Skilled Therapeutic Interventions/Progress Updates: Pt not finished w/ lunch and PT will return.. PT returned and pt w/ nursing for catheter training.  Pt refused participation but transferred w/ nursing sit to stand w/ mod A and then amb to bed w/ RW and supervision.  Pt required assist to bring LEs into bed in reverse log-roll position.  Pt remained sidelying w/ pillows behind back and between knees for comfort.  Nurse remained in room for IV connect.       Therapy Documentation Precautions:  Precautions Precautions: Fall, Back, Other (comment) Precaution Booklet Issued: No Precaution Comments: wound vac, pt able to recall back precautions Required Braces or Orthoses: Other Brace Other Brace: no brace needed per order, d/t wound vac Restrictions Weight Bearing Restrictions: No General: PT Amount of Missed Time (min): 45 Minutes PT Missed Treatment Reason: Pain Vital Signs:  Pain:9/10 Pain Assessment Pain Scale: 0-10 Pain Score: 7  Pain Type: Acute pain;Surgical pain Pain Location: Back Pain Orientation: Mid Pain Descriptors / Indicators: Aching;Dull;Sharp;Throbbing;Spasm Pain Frequency: Constant Pain Onset: On-going Pain Intervention(s): Medication (See eMAR) Mobility:        Therapy/Group: Individual Therapy  [name] 02/24/2021, 1:42 PM

## 2021-02-25 LAB — CBC
HCT: 26.9 % — ABNORMAL LOW (ref 39.0–52.0)
Hemoglobin: 8.3 g/dL — ABNORMAL LOW (ref 13.0–17.0)
MCH: 27.9 pg (ref 26.0–34.0)
MCHC: 30.9 g/dL (ref 30.0–36.0)
MCV: 90.3 fL (ref 80.0–100.0)
Platelets: 207 10*3/uL (ref 150–400)
RBC: 2.98 MIL/uL — ABNORMAL LOW (ref 4.22–5.81)
RDW: 15 % (ref 11.5–15.5)
WBC: 6.4 10*3/uL (ref 4.0–10.5)
nRBC: 0 % (ref 0.0–0.2)

## 2021-02-25 LAB — BASIC METABOLIC PANEL
Anion gap: 9 (ref 5–15)
BUN: 9 mg/dL (ref 6–20)
CO2: 28 mmol/L (ref 22–32)
Calcium: 8.5 mg/dL — ABNORMAL LOW (ref 8.9–10.3)
Chloride: 98 mmol/L (ref 98–111)
Creatinine, Ser: 1.03 mg/dL (ref 0.61–1.24)
GFR, Estimated: >60 mL/min (ref >60)
Glucose, Bld: 93 mg/dL (ref 70–99)
Potassium: 4.6 mmol/L (ref 3.5–5.1)
Sodium: 135 mmol/L (ref 135–145)

## 2021-02-25 LAB — GLUCOSE, CAPILLARY
Glucose-Capillary: 95 mg/dL (ref 70–99)
Glucose-Capillary: 96 mg/dL (ref 70–99)
Glucose-Capillary: 110 mg/dL — ABNORMAL HIGH (ref 70–99)
Glucose-Capillary: 100 mg/dL — ABNORMAL HIGH (ref 70–99)

## 2021-02-25 LAB — SEDIMENTATION RATE: Sed Rate: 80 mm/hr — ABNORMAL HIGH (ref 0–16)

## 2021-02-25 LAB — C-REACTIVE PROTEIN: CRP: 7.7 mg/dL — ABNORMAL HIGH (ref <1.0)

## 2021-02-25 MED ORDER — ONETOUCH VERIO VI STRP: ORAL_STRIP | 12 refills | Status: DC

## 2021-02-25 MED ORDER — ONETOUCH DELICA LANCETS 33G MISC: 12 refills | Status: DC

## 2021-02-25 NOTE — Final Consult Note (Signed)
[facility] Nurse wound follow up Patient receiving care in [facility] 4W16. Patient premedicated prior to initiation of VAC change. Patient able to position self, sitting edge of bed, for VAC change. Wound type: lumbar surgical washout site Measurement: 21 cm x 6.5 cm x 3.2 cm depth at superior apex of wound and 3 cm depth a base of wound Wound bed: beefy red Drainage (amount, consistency, odor) serosanginous in cannister Periwound: Patient has ongoing development of scattered fluid filled bullae.  Today there are several along the drape margin on the side where the bridging was done. There were also two under the existing Telfa pads. Dressing procedure/placement/frequency: All drape, telfa, and black and white sponge pieces were gently removed. I was able to maintain the bullae intact without rupture. The entire area surrounding the wound and the skin going to the right lateral side where the bridging occurs, was protected with several 14M skin barrier film wipes. Then, I place 3 entire Telfa pads over the bridging site, focusing on the areas with bullae.  2 pieces of white foam were placed into the wound bed, and one piece of black foam was placed over these. The lumbar foam was bridged to the right lateral side. Trac pad applied, immediate seal obtained. The patient tolerated with minimal expressed discomfort. I assisted him back into a lying position in the bed with covers, and tray table and call bell within his reach.  The box of 14M home VAC dressing supplies are in his room, along with the home VAC already plugged into the outlet for charging.  Patient tells me he is being discharged today. Val Riles, RN, MSN, CWOCN, CNS-BC, pager [phone]

## 2021-02-25 NOTE — Progress Notes (Signed)
Patient ID: [name], male   DOB: [birth year], 55 y.o.   MRN: [id number]  SW received updates from Pam[facility] that medications will be mixed and delivered to his home.  SW met with pt in room to inform on above. Pt wound vac charging in room.  SW left message for [facility] [phone]: [phone]) requesting appointment be moved to Wednesday, November 16 since pt will d/c to home today. *SW made contact with clinic who reported Dr. Thurman Coyer earliest appointment is 12/1 @ 12:45pm.  SW updated attending and PA on new challenges with late date in wound care clinic now. PA to look into other options for pt. SW spoke with [facility] RN to discuss if pt can d/c to home with wife performing wound care needs. After consulting with her team, suggested that wife not perform wound care needs, and contact pt surgeon. SW updated PA-Pam on above. Reports she will follow-up once there are more updates.   SW met with pt and pt wife [name] in room to discuss new challenges. Pt does not want to go to SNF. SW informed will see if there are any [facility] willing to accept. SW reiterated this is highly unlikely. SW informed will provide updates once there is an answer.   SW received update from PA-Pam that surgeon's office is unable to manage pt wound care needs and unable to get another wound care clinic with an earlier appointment. SW met with pt and pt wife in room to provide updates.   Loralee Pacas, MSW, [place] Office: [phone] Cell: [phone] Fax: [phone]

## 2021-02-25 NOTE — Progress Notes (Signed)
[name] is a 55 y.o. male with medical history significant of DM; HTN; HLD; OSA; and obesity who is currently in CIR.   He was initially admitted from 9/30-10/7 for lumbar fusion with Dr. Ronnald Ramp.  He had no reported complications and was discharged to CIR.  He was readmitted to the neurosurgery service (Dr. Zada Finders) from 10/14-20 from rehab due to persistent drainage from his wound and blood cultures + for Klebsiella pneumoniae.  He had a washout and wound cultures also grew Klebsiella.  He had a wound vac place and was discharged back to rehab.  Rehab called requesting admission for long-term care on the acute sire:  Diabetic with back problems, had surgery.  Had back infection, drainage.  He has an open wound on his back, wound vac and changing canister at least daily.  He is unable to get home health and wound care can't see until 12/15.  Dressing change is too complex for family to change.  He is on IV antibiotics until the end of the month.  They would like for him to remain in the hospital for wound care dressing changes TID and IV antibiotics.  Chart reviewed, discussed patient with [facility].  He does not have an ongoing reason for hospitalization.  HH appears to have been arranged and inpatient wound care has signed off.  If there are further wound care concerns, CIR is encouraged to consult the neurosurgeon for assistance - the patient may be able to have dressing changes at the neurosurgery office if wound care is unavailable.  He does not appear to have acute hospitalist needs at this time and so request for admission is deferred.  Reesa Chew was notified.   Carlyon Shadow, M.D.

## 2021-02-25 NOTE — Progress Notes (Signed)
Pt continues to self cath with set up assist. Reviewed assistance level with pt's brother and spouse, [name]. Education also provided on wound vac care and canister change. Both stated understanding. Continue plan of care.   Gerald Stabs, RN

## 2021-02-25 NOTE — Progress Notes (Signed)
Patient self cathed twice successfully this shift. Patient continues to require staff for positioning assist and set up.

## 2021-02-25 NOTE — Progress Notes (Signed)
Inpatient Rehabilitation Discharge Medication Review by a Pharmacist  A complete drug regimen review was completed for this patient to identify any potential clinically significant medication issues.  High Risk Drug Classes Is patient taking? Indication by Medication  Antipsychotic No   Anticoagulant Yes Lovenox for DVT ppx for 2 months post surgery  Antibiotic Yes IV cefazolin for bacteremia / lumbar wound infection until 03/11/21  Opioid Yes Oxycodone for pain / MS contin for pain, Dilaudid prior to dressing changes  Antiplatelet No   Hypoglycemics/insulin Yes Metformin for DM  Vasoactive Medication Yes Losartan for BP  Chemotherapy No   Other Yes Celexa for mood Gabapentin for pain Zanaflex for muscle spasms     Type of Medication Issue Identified Description of Issue Recommendation(s)  Drug Interaction(s) (clinically significant)     Duplicate Therapy     Allergy     No Medication Administration End Date     Incorrect Dose     Additional Drug Therapy Needed     Significant med changes from prior encounter (inform family/care partners about these prior to discharge).    Other       Clinically significant medication issues were identified that warrant physician communication and completion of prescribed/recommended actions by midnight of the next day:  No  Pharmacist comments: Sent for [place] to help with Lovenox   Time spent performing this drug regimen review (minutes):  20 minutes  Glendell Fouse S. Alford Highland, PharmD, BCPS Clinical Staff Pharmacist [email or web address] [place] 02/25/2021 8:56 AM

## 2021-02-25 NOTE — Progress Notes (Signed)
[place] PHYSICAL MEDICINE & REHABILITATION PROGRESS NOTE  Subjective/Complaints:   Pt reports  R thigh is tightening up- feels like "attached" to R mid back trigger point.   Ready for d/c.    ROS:   Pt denies SOB, abd pain, CP, N/V/C/D, and vision changes    Objective: Vital Signs: Blood pressure 117/66, pulse 97, temperature 98.1 F (36.7 C), temperature source Oral, resp. rate 18, height '6\' 2"'  (1.88 m), weight 135.6 kg, SpO2 97 %. No results found. Recent Labs    02/25/21 0348  WBC 6.4  HGB 8.3*  HCT 26.9*  PLT 207     Recent Labs    02/25/21 0348  NA 135  K 4.6  CL 98  CO2 28  GLUCOSE 93  BUN 9  CREATININE 1.03  CALCIUM 8.5*     Intake/Output Summary (Last 24 hours) at 02/25/2021 0818 Last data filed at 02/25/2021 0556 Gross per 24 hour  Intake 240 ml  Output 1525 ml  Net -1285 ml        Physical Exam: BP 117/66 (BP Location: Left Arm)   Pulse 97   Temp 98.1 F (36.7 C) (Oral)   Resp 18   Ht '6\' 2"'  (1.88 m)   Wt 135.6 kg   SpO2 97%   BMI 38.38 kg/m           General: awake, alert, appropriate, laying in bed- eating breakfast; NAD HENT: conjugate gaze; oropharynx moist CV: regular rate; no JVD Pulmonary: CTA B/L; no W/R/R- good air movement GI: soft, NT, ND, (+)BS Psychiatric: appropriate Neurological: Ox3  Musculoskeletal: TTP, but less so R low back- palpable trigger points' palpable trigger point in R quad- is actually tighter; but less tight on R low/mid back just lateral to upper part of VAC- no change Skin: Warm and dry. VAC in place- suctioning working/in place-still working- to be changed this AM Musc: Neuro: Alert Motor: Bilateral upper extremities: 5/5 proximal distal LE- HF 4/5; KE/KF 4+/5; Df/PF 4+/5  Sensation intact   Assessment/Plan: 1. Functional deficits which require 3+ hours per day of interdisciplinary therapy in a comprehensive inpatient rehab setting. Physiatrist is providing close team supervision  and 24 hour management of active medical problems listed below. Physiatrist and rehab team continue to assess barriers to discharge/monitor patient progress toward functional and medical goals   Care Tool:  Bathing    Body parts bathed by patient: Right arm, Left arm, Chest, Abdomen, Front perineal area, Right upper leg, Left upper leg, Face, Buttocks, Right lower leg, Left lower leg   Body parts bathed by helper: Buttocks, Right lower leg, Left lower leg     Bathing assist Assist Level: Supervision/Verbal cueing (per most recent staff report)     Upper Body Dressing/Undressing Upper body dressing   What is the patient wearing?: Pull over shirt    Upper body assist Assist Level: Independent (per most recent staff report, donned a hospital gown on grad day)    Lower Body Dressing/Undressing Lower body dressing      What is the patient wearing?: Pants     Lower body assist Assist for lower body dressing: Independent with assitive device     Toileting Toileting    Toileting assist Assist for toileting: Independent with assistive device (per most recent staff report)     Transfers Chair/bed transfer  Transfers assist  Chair/bed transfer activity did not occur: Safety/medical concerns  Chair/bed transfer assist level: Independent with assistive device Chair/bed transfer assistive device: Gilford Rile  Locomotion Ambulation   Ambulation assist   Ambulation activity did not occur: Safety/medical concerns  Assist level: Supervision/Verbal cueing Assistive device: Walker-rolling Max distance: 5   Walk 10 feet activity   Assist  Walk 10 feet activity did not occur: Safety/medical concerns  Assist level: Supervision/Verbal cueing Assistive device: Walker-rolling   Walk 50 feet activity   Assist Walk 50 feet with 2 turns activity did not occur: Safety/medical concerns  Assist level: Supervision/Verbal cueing Assistive device: Walker-rolling    Walk 150 feet  activity   Assist Walk 150 feet activity did not occur: Safety/medical concerns  Assist level: Supervision/Verbal cueing Assistive device: Walker-rolling    Walk 10 feet on uneven surface  activity   Assist Walk 10 feet on uneven surfaces activity did not occur: Safety/medical concerns         Wheelchair     Assist Is the patient using a wheelchair?: Yes Type of Wheelchair: Manual Wheelchair activity did not occur: Safety/medical concerns  Wheelchair assist level: Total Assistance - Patient < 25% Max wheelchair distance: 150'    Wheelchair 50 feet with 2 turns activity    Assist    Wheelchair 50 feet with 2 turns activity did not occur: Safety/medical concerns   Assist Level: Total Assistance - Patient < 25%   Wheelchair 150 feet activity     Assist  Wheelchair 150 feet activity did not occur: Safety/medical concerns   Assist Level: Total Assistance - Patient < 25%    Medical Problem List and Plan: 1.  Lumbar radiculopathy/myelopathy secondary to nerve root compression- s/p decompression and wash out after bacteremia from klebsiella pneumonia 11/7- con't PT and OT- pt hasn't learned to cath yet- explained he HAS to today- con't therapies and family training. 11/9- question of LTACH discharge?- 11/10- holding d/c- hopeful with all of the medical and nursing needs pt has, and no H/H, that we can get to [facility]- discharged from PT and OT 11/11- insurance has denied pt to go to [facility]- am trying to do a peer to peer- pt has SO many nursing and medical needs-with the VAC, the IV ABX; the in/out cathing; the losing blood still during VAC change so his Hb keeps trneding down more s/p 1 unit pRBCS and still tachycardic just sitting up- but also having orthostatic hypotension - also needs lovenox for 2 months. Will d/w Dr Rosendo Gros today  11/12- con't PT and OT- denied from [facility] -will d/c Tuesday- after trigger point injections has appt with me 11/18 11/14- d/c today-  has f/u scheduled 11/18 2.  Impaired mobility: continue Lovenox  Resume lovenox given that pt not undergoing IR procedure  Dr Zada Finders to f/u  11/2- will need after d/c for a total of 2 months from original surgery. 11/7- will order lovenox injection kit- needs to do at home   11/9- will need lovenox for a total of 2 months from last surgery             -antiplatelet therapy: N/A 3. Postoperative pain: Continue Oxycodone 15 mg prn. Resumed MSContin for more consistent pain relief. Scheduled Tizanidine TID. Resumed muscle rub to right thigh as it was helping before.  10/27- will add valium 5 mg nightly for muscle spasms  10/28- feels like pain a little better- reminded has IV Dilaudid for VAC changes- will take  11/1- will increase MS Contin to 30 mg BID  11/3- lidocaine patches helpful- will go over risks/benefits of trigger point  11/8- trigger point injections today for pain  control- will increase gabapentin to 800 mg TID 11/10- helpful- feels like trigger points in back breaking up but now has in R thigh- almost fell due to spasm yesterday- will do trigger point injections in R thigh today.  11/11- improved after Back injection; more pain in R thigh after pain. 11/12- will redo injection Tuesday before d/c. 11/14- will go home on current pain regimen- I will refill for now    Monitor with increased exertion 4. Mood: Team to provide ego support. LCSW to follow for evaluation and support.              -antipsychotic agents: N/A 5. Neuropsych: This patient is capable of making decisions on his own behalf. 6. Skin/Wound Care:  Monitor wound daily. Added vitamins and protein supplement to help promote wound healing.  7. Fluids/Electrolytes/Nutrition: Monitor I/Os 8. Klebsiella bacteremia w/diskitis: On Cefazolin 2 g every 8 hrs with end date 03/11/21 -- Check weekly CRP/ESR.   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  11/7- ESR 77- much better and CRP down to 7- again, better- con't regimen 11/9- needs labs weekly sent to ID 9. HTN: Monitor BP tid--continue Microzide and cozaar daily.  10/24- d/c HCTZ due to low Na   Vitals:   02/24/21 1947 02/25/21 0526  BP: 128/67 117/66  Pulse: (!) 108 97  Resp: 18 18  Temp: 98.2 F (36.8 C) 98.1 F (36.7 C)  SpO2: 96% 97%   11/10-HR a little elevated, but BP controlled- con't regimen  11/11- having orthostatic hypotension and dizziness but heart rate up- so might need Mododrine? 11/12- doing a little better today with abd binder and TEDs/ACEp wraps 10. T2DM with hyperglycemia: CM diet. On Metformin 1000 mg bid  10/26- BG's 140s-160s usually- con't regimen  10/29- somehow CBGs aren't being done- reordered  10/30- BG's 140s-160s usually- con't regimen  CBG (last 3)  Recent Labs    02/24/21 1618 02/24/21 2103 02/25/21 0610  GLUCAP 104* 115* 95   11/14- BG's controlled- con't regimen 11. Neurogenic bladder: with urinary retention On flomax. Increased to 0.8 mg nightly   10/24- still not voiding- will check U/A and Cx 10/26- no voiding- U/A (-)  111/3- explained to pt he also needs to learn to cath- if wife isn't home-will ask nursing to teach.    11/7- wife insists pt will need to cath- asked OT to get mirror to teach  11/9- pt has learned to  cath at least 1x  11/14- pt cathing himself per nursing notes 13. Neuropathy: Continue Neurontin 600 mg TID.  14. ABLA: monitor for signs of bleeding. Hgb stabilizing to 8 range.  Hb 8.9 on 10/21  10/28- Hb  down to 7.7- which might be lower, since kidney function is worse- will recheck in AM  10/29- Hb 7.5- likely from hemodilution and wound VAC- con't to recheck - Monday  11/3- pt Hb down to 7.3- pulse is 104- and symptomatic- will recheck in AM, but will recheck in AM- will likely need transfusion tomorrow or Saturday.   11/4- actually, had episode where BP dropped to 79/40s yesterday with standing and HR up to 124- transfused 1 unit- Hb back up to 8.5-   11/7- Hb up to 8.9- con't to monitor Repeat Hgb in am 11/6    11/12- labs on Monday Cont to monitor 15. 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 -- Follow up with surgery after discharge.  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  17. Abdominal pain: Much better--dysesthesias?  18. Hypomagnesemia: Resumed Mag Ox.   Mag 1.6 on 10/21, labs ordered for Monday  10/24- Mg 2.1  10/29- Mg 2.1- monitor 19. Hyponatremia  Na+ 132 on 10/21, labs ordered for Monday  10/24- Na down to 128- will do fluid restriction 1500cc- and stop HCTZ  10/25- Na 127- will recheck in AM after off HCTZ and on fluid restriction  10/26- Na up to 129- con't fluid restriction and recheck Friday  10/29- Na up to 131- recheck in AM 10/31 Na+ up to 134  11/3- Na up to 134  11/4- will reduce fluid restriction to 2000 cc/day  11/7- Na up to 135- doing better- con't 2000cc fluid restriction 20. Hypoalbuminemia  Supplement initiated on 10/21 21. Azotemia/ARI   10/31 resolved , off IVF  , cont to monitor weekly   22 Constipation  11/8- better- con't regimen 23. Hypokalemia  10/28- will give KCL x2 40 mEq and recheck in AM- also checking Mg since can cause it to be low as well.  10/31 resolved K+ 3.9, off supplementation , repeat  BMET in am  24. N/V- improved 11/6  10/30- will give antinausea medicine- KUB (-) except for constipation- has pooped 2x-  Will monitor and might need more cleaning out  11/3- will spread out AM meds to see if helps N/V.   11/10- still having intermittently- N/V- and diarrhea- IV ABX?? Will d/w pharmacy     LOS: 25 days A FACE TO FACE EVALUATION WAS PERFORMED  Louie Meaders 02/25/2021, 8:18 AM

## 2021-02-26 ENCOUNTER — Other Ambulatory Visit: Payer: Self-pay | Admitting: Internal Medicine

## 2021-02-26 LAB — GLUCOSE, CAPILLARY
Glucose-Capillary: 88 mg/dL (ref 70–99)
Glucose-Capillary: 135 mg/dL — ABNORMAL HIGH (ref 70–99)
Glucose-Capillary: 123 mg/dL — ABNORMAL HIGH (ref 70–99)
Glucose-Capillary: 135 mg/dL — ABNORMAL HIGH (ref 70–99)

## 2021-02-26 MED ORDER — LIDOCAINE HCL 1 % IJ SOLN
10.0000 mL | INTRAMUSCULAR | Status: AC
  Administered 2021-02-26: 10 mL
  Filled 2021-02-26 (×2): qty 10

## 2021-02-26 NOTE — Patient Care Conference (Signed)
Inpatient RehabilitationTeam Conference and Plan of Care Update Date: 02/26/2021   Time: 11:24 AM    Patient Name: [name]      Medical Record Number: [id number]  Date of Birth: [birth year] Sex: Male         Room/Bed: 4W16C/4W16C-01 Payor Info: Payor: Theme park manager / Plan: Theme park manager OTHER / Product Type: *No Product type* /    Admit Date/Time:  01/31/2021  3:41 PM  Primary Diagnosis:  Lumbar disc herniation with myelopathy  Hospital Problems: Principal Problem:   Lumbar disc herniation with myelopathy Active Problems:   Wound infection after surgery   Muscle spasms of both lower extremities   Hypoalbuminemia due to protein-calorie malnutrition (HCC)   Hyponatremia   Hypomagnesemia   Acute blood loss anemia   Postoperative pain   Disto-occlusion   Lumbar discitis   Epidural abscess   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 Courtney Heys Social Worker Present: Loralee Pacas, [place] Nurse Present: Dorthula Nettles, RN PT Present: Excell Seltzer, PT OT Present: Roanna Epley, [place], OT PPS Coordinator present : Ileana Ladd, PT     Current Status/Progress Goal Weekly Team Focus  Bowel/Bladder   Patient I&O cathing self, continent bowel  Regain ability to void  I&O cath education, remain continent bowel   Swallow/Nutrition/ Hydration             ADL's             Mobility   d/c from therapy, considering re-eval  d/c from therapy, considering re-eval  d/c from therapy, considering re-eval   Communication             Safety/Cognition/ Behavioral Observations            Pain   rates pain at 9/10  Pain <3.  assess pain q 4 hr and prn   Skin   wound vac in place, WOC changin 3 times weeklu  No new breakdowns  assess skin q shift and prn     Discharge Planning:  D/c pending due to wound care needs. Insurance denied LTACH as determined  he did not meet criteria. Pt has wound care clinic appt now 12/1 with [facility]. Will explore [facility] again, as well as SNF to see if they can assist with wound care. Pt has support from [place] for IV abx and [facility] for PICC line and weekly labs.   Team Discussion: Repeat trigger point injections. Patient able to cath self. Continue IV abx until 03/11/21. Hgb dropping due to drainage from wound vac. Wound vac being changed 3 times weekly by WOC. Still trying for HH/Outpatient wound care, therapy.  Patient on target to meet rehab goals: yes, discharged from therapy at supervision level, could reach mod I.  *See Care Plan and progress notes for long and short-term goals.   Revisions to Treatment Plan:  None  Teaching Needs: Family education, medication/pain management, skin/wound care, bowel/bladder management, safety awareness, transfer training, etc.  Current Barriers to Discharge: Decreased caregiver support, Home enviroment access/layout, Wound care, Weight, Weight bearing restrictions, and Medication compliance  Possible Resolutions to Barriers: Family education Continue search for [facility] availability Wound care set-up clinic     Medical Summary Current Status: on IV ABX til 11/28- wound VAC; Hb slowly trending down due to drainage from [facility]- cathing self; rates pain 9-10/10;  Barriers to Discharge: Decreased family/caregiver support;Home  enviroment access/layout;IV antibiotics;Wound care;Medical stability;Weight;Weight bearing restrictions;Medication compliance  Barriers to Discharge Comments: trending down Hb; Possible Resolutions to [employer] Focus: to try and place him; acute won't take him; cannot get H/H so far- due to wound VAC; is at supervision level currently; cannot get him home right now; he still needs f/u therapy;  will d/w to restart therapy??? 12/1??? due to IV ABX issues.   Continued Need for Acute Rehabilitation Level of  Care: The patient requires daily medical management by a physician with specialized training in physical medicine and rehabilitation for the following reasons: Direction of a multidisciplinary physical rehabilitation program to maximize functional independence : Yes Medical management of patient stability for increased activity during participation in an intensive rehabilitation regime.: Yes Analysis of laboratory values and/or radiology reports with any subsequent need for medication adjustment and/or medical intervention. : Yes   I attest that I was present, lead the team conference, and concur with the assessment and plan of the team.   Cristi Loron 02/26/2021, 3:54 PM

## 2021-02-26 NOTE — Progress Notes (Signed)
[facility] PHYSICAL MEDICINE & REHABILITATION PROGRESS NOTE  Subjective/Complaints:   Pt reports no more therapy- has been d/c'd from therapy-  Still trying to determine safe d/c- H/H keeps turning him down due to complexity-   Asking for trigger point injections again- will do today.   Of note, CRP 7.7 up slightly from 7.0; and ESR 80 up slightly from 77- but overall stable.    ROS:   Pt denies SOB, abd pain, CP, N/V/C/D, and vision changes   Objective: Vital Signs: Blood pressure 129/79, pulse (!) 102, temperature 98.4 F (36.9 C), temperature source Oral, resp. rate 16, height '6\' 2"'  (1.88 m), weight 135.6 kg, SpO2 95 %. No results found. Recent Labs    02/25/21 0348  WBC 6.4  HGB 8.3*  HCT 26.9*  PLT 207     Recent Labs    02/25/21 0348  NA 135  K 4.6  CL 98  CO2 28  GLUCOSE 93  BUN 9  CREATININE 1.03  CALCIUM 8.5*     Intake/Output Summary (Last 24 hours) at 02/26/2021 1010 Last data filed at 02/26/2021 0911 Gross per 24 hour  Intake 236 ml  Output 2000 ml  Net -1764 ml        Physical Exam: BP 129/79 (BP Location: Left Arm)   Pulse (!) 102   Temp 98.4 F (36.9 C) (Oral)   Resp 16   Ht '6\' 2"'  (1.88 m)   Wt 135.6 kg   SpO2 95%   BMI 38.38 kg/m            General: awake, alert, appropriate, asleep initially; woke easily; NAD HENT: conjugate gaze; oropharynx moist CV: regular rate; no JVD Pulmonary: CTA B/L; no W/R/R- good air movement GI: soft, NT, ND, (+)BS- protuberant Psychiatric: appropriate- flat; but better Neurological: Ox3  Musculoskeletal: Still TTP over trigger points in R mid back. Next to lateral aspect/part of VAC- no change Skin: Warm and dry. VAC in place- suctioning working/in place-still working- to be changed this AM Musc: Neuro: Alert Motor: Bilateral upper extremities: 5/5 proximal distal LE- HF 4/5; KE/KF 4+/5; Df/PF 4+/5  Sensation intact   Assessment/Plan: 1. Functional deficits which require 3+  hours per day of interdisciplinary therapy in a comprehensive inpatient rehab setting. Physiatrist is providing close team supervision and 24 hour management of active medical problems listed below. Physiatrist and rehab team continue to assess barriers to discharge/monitor patient progress toward functional and medical goals   Care Tool:  Bathing    Body parts bathed by patient: Right arm, Left arm, Chest, Abdomen, Front perineal area, Right upper leg, Left upper leg, Face, Buttocks, Right lower leg, Left lower leg   Body parts bathed by helper: Buttocks, Right lower leg, Left lower leg     Bathing assist Assist Level: Supervision/Verbal cueing (per most recent staff report)     Upper Body Dressing/Undressing Upper body dressing   What is the patient wearing?: Pull over shirt    Upper body assist Assist Level: Independent (per most recent staff report, donned a hospital gown on grad day)    Lower Body Dressing/Undressing Lower body dressing      What is the patient wearing?: Pants     Lower body assist Assist for lower body dressing: Independent with assitive device     Toileting Toileting    Toileting assist Assist for toileting: Independent with assistive device (per most recent staff report)     Transfers Chair/bed transfer  Transfers assist  Chair/bed  transfer activity did not occur: Safety/medical concerns  Chair/bed transfer assist level: Independent with assistive device Chair/bed transfer assistive device: Programmer, multimedia   Ambulation assist   Ambulation activity did not occur: Safety/medical concerns  Assist level: Supervision/Verbal cueing Assistive device: Walker-rolling Max distance: 5   Walk 10 feet activity   Assist  Walk 10 feet activity did not occur: Safety/medical concerns  Assist level: Supervision/Verbal cueing Assistive device: Walker-rolling   Walk 50 feet activity   Assist Walk 50 feet with 2 turns activity  did not occur: Safety/medical concerns  Assist level: Supervision/Verbal cueing Assistive device: Walker-rolling    Walk 150 feet activity   Assist Walk 150 feet activity did not occur: Safety/medical concerns  Assist level: Supervision/Verbal cueing Assistive device: Walker-rolling    Walk 10 feet on uneven surface  activity   Assist Walk 10 feet on uneven surfaces activity did not occur: Safety/medical concerns         Wheelchair     Assist Is the patient using a wheelchair?: Yes Type of Wheelchair: Manual Wheelchair activity did not occur: Safety/medical concerns  Wheelchair assist level: Total Assistance - Patient < 25% Max wheelchair distance: 150'    Wheelchair 50 feet with 2 turns activity    Assist    Wheelchair 50 feet with 2 turns activity did not occur: Safety/medical concerns   Assist Level: Total Assistance - Patient < 25%   Wheelchair 150 feet activity     Assist  Wheelchair 150 feet activity did not occur: Safety/medical concerns   Assist Level: Total Assistance - Patient < 25%    Medical Problem List and Plan: 1.  Lumbar radiculopathy/myelopathy secondary to nerve root compression- s/p decompression and wash out after bacteremia from klebsiella pneumonia 11/7- con't PT and OT- pt hasn't learned to cath yet- explained he HAS to today- con't therapies and family training. 11/9- question of LTACH discharge?- 11/10- holding d/c- hopeful with all of the medical and nursing needs pt has, and no H/H, that we can get to [facility]- discharged from PT and OT 11/11- insurance has denied pt to go to [facility] [place]- am trying to do a peer to peer- pt has SO many nursing and medical needs-with the VAC, the IV ABX; the in/out cathing; the losing blood still during VAC change so his Hb keeps trneding down more s/p 1 unit pRBCS and still tachycardic just sitting up- but also having orthostatic hypotension - also needs lovenox for 2 months. Will d/w Dr Rosendo Gros today   11/12- con't PT and OT- denied from [facility] -will d/c Tuesday- after trigger point injections has appt with me 11/18 11/15- wasn't able to be discharged due to H/H issues- VAC cannot be changed outpt right now - no H/H or wound care office will take him.  2.  Impaired mobility: continue Lovenox  Resume lovenox given that pt not undergoing IR procedure  Dr Zada Finders to f/u  11/2- will need after d/c for a total of 2 months from original surgery. 11/7- will order lovenox injection kit- needs to do at home   11/9- will need lovenox for a total of 2 months from last surgery             -antiplatelet therapy: N/A 3. Postoperative pain: Continue Oxycodone 15 mg prn. Resumed MSContin for more consistent pain relief. Scheduled Tizanidine TID. Resumed muscle rub to right thigh as it was helping before.  10/27- will add valium 5 mg nightly for muscle spasms  10/28- feels  like pain a little better- reminded has IV Dilaudid for VAC changes- will take  11/1- will increase MS Contin to 30 mg BID  11/3- lidocaine patches helpful- will go over risks/benefits of trigger point  11/8- trigger point injections today for pain control- will increase gabapentin to 800 mg TID 11/10- helpful- feels like trigger points in back breaking up but now has in R thigh- almost fell due to spasm yesterday- will do trigger point injections in R thigh today.  11/11- improved after Back injection; more pain in R thigh after pain. 11/12- will redo injection Tuesday before d/c. 11/14- will go home on current pain regimen- I will refill for now   11/15- will do trigger point injections again today  Monitor with increased exertion 4. Mood: Team to provide ego support. LCSW to follow for evaluation and support.              -antipsychotic agents: N/A 5. Neuropsych: This patient is capable of making decisions on his own behalf. 6. Skin/Wound Care:  Monitor wound daily. Added vitamins and protein supplement to help promote wound  healing.  7. Fluids/Electrolytes/Nutrition: Monitor I/Os 8. Klebsiella bacteremia w/diskitis: On Cefazolin 2 g every 8 hrs with end date 03/11/21 -- Check weekly CRP/ESR.   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  11/7- ESR 77- much better and CRP down to 7- again, better- con't regimen 11/9- needs labs weekly sent to ID 11/15- stable labs- con't regimen til 11/28 9. HTN: Monitor BP tid--continue Microzide and cozaar daily.  10/24- d/c HCTZ due to low Na   Vitals:   02/25/21 2014 02/26/21 0606  BP: 128/75 129/79  Pulse: 99 (!) 102  Resp: 16 16  Temp: 98.4 F (36.9 C) 98.4 F (36.9 C)  SpO2: 100% 95%   11/10-HR a little elevated, but BP controlled- con't regimen  11/11- having orthostatic hypotension and dizziness but heart rate up- so might need Mododrine? 11/12- doing a little better today with abd binder and TEDs/ACEp wraps 11/15- doing better- Hb down to 8.3 and pulse back up- likely from wound VAC- will con't to monitor.  10. T2DM with hyperglycemia: CM diet. On Metformin 1000 mg bid  10/26- BG's 140s-160s usually- con't regimen  10/29- somehow CBGs aren't being done- reordered  10/30- BG's 140s-160s usually- con't regimen  CBG (last 3)  Recent Labs    02/25/21 1727 02/25/21 2119  02/26/21 0603  GLUCAP 110* 100* 88   11/14- BG's controlled- con't regimen 11. Neurogenic bladder: with urinary retention On flomax. Increased to 0.8 mg nightly   10/24- still not voiding- will check U/A and Cx 10/26- no voiding- U/A (-)  111/3- explained to pt  he also needs to learn to cath- if wife isn't home-will ask nursing to teach.    11/7- wife insists pt will need to cath- asked OT to get mirror to teach  11/9- pt has learned to cath at least 1x  11/15- pt cathing self-  13. Neuropathy: Continue Neurontin 600 mg TID.  14. ABLA: monitor for signs of bleeding. Hgb stabilizing to 8 range.  Hb 8.9 on 10/21  10/28- Hb down to 7.7- which might be lower, since kidney function is worse- will recheck in AM  10/29- Hb 7.5- likely from hemodilution and wound VAC- con't to recheck - Monday  11/3- pt Hb down to 7.3- pulse is 104- and symptomatic- will recheck in AM, but will recheck in AM- will likely need transfusion tomorrow or Saturday.   11/4- actually, had episode where BP dropped to 79/40s yesterday with standing and HR up to 124- transfused 1 unit- Hb back up to 8.5-   11/7- Hb up to 8.9- con't to monitor Repeat Hgb in am 11/6  11/15- Hb down to 8.3- dropping with drainage from wound VAC-  Cont to monitor 15. 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 -- Follow up with surgery after discharge.  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  17. Abdominal pain: Much better--dysesthesias?  18. Hypomagnesemia: Resumed Mag Ox.   Mag 1.6 on 10/21, labs ordered for Monday  10/24- Mg 2.1  10/29- Mg 2.1- monitor 19. Hyponatremia  Na+ 132 on 10/21, labs ordered for Monday  10/24- Na down to 128- will do fluid restriction 1500cc- and stop HCTZ  10/25- Na 127- will recheck in AM after off HCTZ and on fluid restriction  10/26- Na up to 129- con't fluid restriction and recheck Friday  10/29- Na up to 131- recheck in AM 10/31 Na+ up to 134  11/3- Na up to 134  11/4-  will reduce fluid restriction to 2000 cc/day  11/7- Na up to 135- doing better- con't 2000cc fluid restriction 20. Hypoalbuminemia  Supplement initiated on 10/21 21. Azotemia/ARI   10/31 resolved , off IVF  , cont to monitor weekly   22 Constipation  11/8- better- con't regimen 23. Hypokalemia  10/28- will give KCL x2 40 mEq and recheck in AM- also checking Mg since can cause it to be low as well.  10/31 resolved K+ 3.9, off supplementation , repeat BMET in am  24. N/V- improved 11/6  10/30- will give antinausea medicine- KUB (-) except for constipation- has pooped 2x-  Will monitor and might need more cleaning out  11/3- will spread out AM meds to see if helps N/V.   11/10- still having intermittently- N/V- and diarrhea- IV ABX?? Will d/w pharmacy  Cannot d/c due to unsafe d/c currently  I spent a total of 36 minute son total care- >50% coordination of care and doing trigger point injections and team conference    LOS: 26 days A FACE TO FACE EVALUATION WAS PERFORMED  Zanyia Silbaugh 02/26/2021, 10:10 AM

## 2021-02-26 NOTE — Progress Notes (Signed)
Patient ID: [name], male   DOB: 1966/01/12, 55 y.o.   MRN: [id number]  SW sent HHPT/OT/SN referral to [facility] to see if able to accept and waiting on follow-up.   Loralee Pacas, MSW, [place] Office: [phone] Cell: [phone] Fax: [phone]

## 2021-02-27 ENCOUNTER — Other Ambulatory Visit (HOSPITAL_COMMUNITY): Payer: Self-pay

## 2021-02-27 LAB — GLUCOSE, CAPILLARY
Glucose-Capillary: 105 mg/dL — ABNORMAL HIGH (ref 70–99)
Glucose-Capillary: 130 mg/dL — ABNORMAL HIGH (ref 70–99)
Glucose-Capillary: 108 mg/dL — ABNORMAL HIGH (ref 70–99)
Glucose-Capillary: 109 mg/dL — ABNORMAL HIGH (ref 70–99)

## 2021-02-27 MED ORDER — FLUTICASONE PROPIONATE 50 MCG/ACT NA SUSP
1.0000 | Freq: Every day | NASAL | Status: DC
  Administered 2021-02-27 – 2021-03-07 (×9): 1 via NASAL
  Filled 2021-02-27: qty 16

## 2021-02-27 NOTE — Evaluation (Signed)
Occupational Therapy Assessment and Plan  Patient Details  Name: [name] MRN: [id number] Date of Birth: September 13, 1965  OT Diagnosis: abnormal posture, acute pain, lumbago (low back pain), and muscle weakness (generalized) Rehab Potential:   ELOS: 2 weeks   Today's Date: 02/27/2021 OT Individual Time: 1300-1340 OT Individual Time Calculation (min): 40 min  and Today's Date: 02/27/2021 OT Missed Time: 20 Minutes Missed Time Reason: Patient fatigue;Pain    Hospital Problem: Principal Problem:   Lumbar disc herniation with myelopathy Active Problems:   Wound infection after surgery   Muscle spasms of both lower extremities   Hypoalbuminemia due to protein-calorie malnutrition (HCC)   Hyponatremia   Hypomagnesemia   Acute blood loss anemia   Postoperative pain   Disto-occlusion   Lumbar discitis   Epidural abscess   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HCC)   Past Medical History:  Past Medical History:  Diagnosis Date   DDD (degenerative disc disease), lumbar    Diabetes mellitus    Hyperlipemia    Hypertension    Obesity    OSA (obstructive sleep apnea)    on CPAP   Past Surgical History:  Past Surgical History:  Procedure Laterality Date   APPLICATION OF ROBOTIC ASSISTANCE FOR SPINAL PROCEDURE N/A 01/11/2021   Procedure: APPLICATION OF ROBOTIC ASSISTANCE FOR SPINAL PROCEDURE;  Surgeon: Judith Part, MD;  Location: [facility];  Service: Neurosurgery;  Laterality: N/A;   APPLICATION OF WOUND VAC N/A 01/25/2021   Procedure: APPLICATION OF WOUND VAC;  Surgeon: Dawley, Theodoro Doing, DO;  Location: [facility];  Service: Neurosurgery;  Laterality: N/A;   BACK SURGERY     COLONOSCOPY  2019   LUMBAR WOUND DEBRIDEMENT N/A 01/25/2021   Procedure: Irrigation and Debridement of lumbar wound, and wound vacuum assisted closure;  Surgeon: Dawley, Theodoro Doing, DO;  Location: [facility];  Service: Neurosurgery;  Laterality: N/A;   TRANSFORAMINAL LUMBAR INTERBODY FUSION (TLIF) WITH PEDICLE SCREW FIXATION 4 LEVEL N/A  01/11/2021   Procedure: Lumbar one-two, Lumbar two-three, Lumbar three-four, Lumbar four-five, Lumbar five Sacral one Open decompression, Transforaminal lumbar interbody fusion, posterolateral instrumented fusion;  Surgeon: Judith Part, MD;  Location: [facility] [facility];  Service: Neurosurgery;  Laterality: N/A;    Assessment & Plan Clinical Impression: [name] is a 55 year old male with history of T2DM, OSA, obesity. DDD with cervical and lumbar stenosis with RLE radiculopathy and significant pain limiting functional status since May 2022. He was found to have lumbar spondylosis with severe canal stenosis from L1-L2  to L4-L5.  He was admitted on 01/12/2019 for L1-S1 laminectomy with PLIF by Dr. Venetia Constable.  Hospital course was significant for issues with hypotension as well as lethargy, elevated CK with lactic acidosis, intermittent tachycardia as well as acute blood loss anemia with hyponatremia as well as drainage from back incision.  He was treated with IV fluids and hyponatremia felt to be dilutional due to fluid boluses.  His blood pressures were improving however he continued to be limited by orthostatic symptoms as well as pain with activity and poor p.o. intake.  Foley was placed due to urinary retention.he continued to be limited by pain, neuropathy, spasms BLE as well as weakness affecting mobility and ADLs. CIR was recommended due to functional decline.     He was admitted to inpatient rehab on 01/18/2021 for comprehensive intensive rehab.  He continued to have issues with abdominal and back pain with poor p.o. intake as well as ongoing serosanguineous drainage from superior aspect of wound.  He  was started on doxycycline for wound prophylaxis and multiple supplements were added to help promote wound healing.  He had recurrent drop in sodium to 128 with AKI and was treated with IV fluids.  He developed hypotension with tachycardia and low-grade fevers due to SIRS on 10/10.  He was started on  broad-spectrum antibiotics and pancultured with blood culture showing Kleb pneumonia.  Antibiotics narrowed to ceftriaxone per ID input.  2D echo was done and was negative for endocarditis.  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     Lumbar spine CT was done due to ongoing back drainage and showed fluid collection soft tissues and musculature posterior to L1/S1 felt to be postop seroma as well as soft tissue fluid collection extending from T10-S1.  Neurosurgery was contacted for input patient was discharged to acute hospital for wound washout and cultures. No purulence noted and wound culture was positive for Klebsiella. Dr. West Bali recommends 6 weeks IV cefazolin from OR date of 10/14 and to follow up on outpatient basis. Back pain is improving but patient continues to be limited by weakness with poor posture and fatigue affecting functional status. Therapy ongoing and he was cleared to resume CIR.  Patient currently requires supervision with basic self-care skills secondary to muscle weakness, decreased cardiorespiratoy endurance, impaired timing and sequencing, unbalanced muscle activation, and decreased coordination, and decreased standing balance, decreased postural control, and decreased balance strategies.  Prior to hospitalization, patient could complete BADL and IADL with independent .  Patient will benefit from skilled intervention to decrease level of assist with basic self-care skills and increase independence with basic self-care skills prior to discharge home with care partner.  Anticipate patient will require 24 hour supervision and follow up home health.  OT - End of Session Activity Tolerance: Tolerates 30+ min activity with multiple rests Endurance Deficit: Yes Endurance Deficit Description: frequent rest breaks due to pain and fatigue OT Assessment OT Barriers to Discharge: Wound Care OT Patient demonstrates impairments in the following area(s):  Balance;Edema;Endurance;Motor;Pain;Safety;Skin Integrity OT Basic ADL's Functional Problem(s): Grooming;Bathing;Dressing OT Transfers Functional Problem(s): Other (comment) (NA as pt is Mod I with toileting per documentation) OT Plan OT Intensity:  (QD) OT Frequency:  (QD) OT Duration/Estimated Length of Stay: 2 weeks OT Treatment/Interventions: Balance/vestibular training;Community reintegration;Disease mangement/prevention;Neuromuscular re-education;Patient/family education;Self Care/advanced ADL retraining;Therapeutic Exercise;UE/LE Coordination activities;Wheelchair propulsion/positioning;UE/LE Strength taining/ROM;Therapeutic Activities;Psychosocial support;Pain management;Functional mobility training;DME/adaptive equipment instruction;Discharge planning;Cognitive remediation/compensation OT Self Feeding Anticipated Outcome(s): no goal OT Basic Self-Care Anticipated Outcome(s): Mod I OT Toileting Anticipated Outcome(s): Mod I OT Bathroom Transfers Anticipated Outcome(s): Mod I OT Recommendation Patient destination: Home Follow Up Recommendations: None Equipment Recommended: To be determined   OT Evaluation Precautions/Restrictions  Precautions Precautions: Fall;Back;Other (comment) (wound vac) Precaution Comments: wound vac, pt able to recall back precautions Other Brace: no brace needed per order, d/t wound vac Restrictions Weight Bearing Restrictions: No General OT Amount of Missed Time: 20 Minutes PT Missed Treatment Reason: Pain Vital Signs Therapy Vitals Temp: 97.9 F (36.6 C) Temp Source: Oral Pulse Rate: 68 Resp: 16 BP: 116/66 Patient Position (if appropriate): Lying Oxygen Therapy SpO2: (!) 57 % O2 Device: Room Air Pain Pain Assessment Pain Scale: 0-10 Pain Score: 9  Home Living/Prior Functioning Home Living Family/patient expects to be discharged to:: Private residence Living Arrangements: Spouse/significant other Available Help at Discharge: Family,  Available 24 hours/day Type of Home: House Home Access: Stairs to enter [employer] of Steps: 3 Entrance Stairs-Rails: Left, Right Home Layout: One level  Bathroom Shower/Tub: Tub/shower unit (per chart) Bathroom Toilet: Handicapped height Bathroom Accessibility: Yes  Lives With: Spouse, Daughter IADL History Homemaking Responsibilities: Yes 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 Function Level of Independence: Independent with gait, Independent with transfers, Requires assistive device for independence, Independent with basic ADLs, Independent with homemaking with ambulation  Able to Take Stairs?: Yes Driving: Yes Vocation: On disability Vocation Requirements: worked Doctor, 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 (Read Only) Comments: Prior to initial surgery, patient was independent but not working due to pain Vision Baseline Vision/History: 1 Wears glasses Ability to See in Adequate Light: 0 Adequate Patient Visual Report: No change from baseline Vision Assessment?: No apparent visual deficits Perception  Perception: Within Functional Limits Praxis Praxis: Intact Cognition Overall Cognitive Status: Within Functional Limits for tasks assessed Arousal/Alertness: Awake/alert Orientation Level: Person;Place;Situation Person: Oriented Place: Oriented Situation: Oriented Year: 2022 Month: November Day of Week: Correct Memory: Appears intact Immediate Memory Recall: Sock;Blue;Bed Memory Recall Sock: Without Cue Memory Recall Blue: Without Cue Memory Recall Bed: Without Cue Attention: Focused;Sustained Focused Attention: Appears intact Sustained Attention: Appears intact Awareness: Appears intact Problem Solving: Appears intact Safety/Judgment: Appears intact Sensation Sensation Light Touch: Appears Intact Proprioception: Appears Intact Stereognosis: Appears Intact Coordination Gross Motor Movements are Fluid and  Coordinated: No Fine Motor Movements are Fluid and Coordinated: Yes Coordination and Movement Description: impaired 2/2 back pain Heel Shin Test: WFL to touch bilaterally but restricted in ROM at hips by body habitus for movement up/ down shins Motor  Motor Motor: Abnormal postural alignment and control Motor - Skilled Clinical Observations: impaired 2/2 pain  Trunk/Postural Assessment  Cervical Assessment Cervical Assessment: Exceptions to [facility] Thoracic Assessment Thoracic Assessment: Exceptions to [facility] Lumbar Assessment Lumbar Assessment: Exceptions to [facility] Postural Control Postural Control: Within Functional Limits  Balance Balance Balance Assessed: Yes Static Sitting Balance Static Sitting - Balance Support: No upper extremity supported;Feet supported Static Sitting - Level of Assistance: 5: Stand by assistance Dynamic Sitting Balance Dynamic Sitting - Balance Support: No upper extremity supported;Feet supported;During functional activity Dynamic Sitting - Level of Assistance: 5: Stand by assistance Static Standing Balance Static Standing - Balance Support: Bilateral upper extremity supported;During functional activity Static Standing - Level of Assistance: 5: Stand by assistance Dynamic Standing Balance Dynamic Standing - Balance Support: Bilateral upper extremity supported;During functional activity Dynamic Standing - Level of Assistance: 4: Min assist Extremity/Trunk Assessment RUE Assessment RUE Assessment: Within Functional Limits LUE Assessment LUE Assessment: Within Functional Limits  Care Tool Care Tool Self Care Eating    Indep    Oral Care     Set up    Bathing   Body parts bathed by patient: Right arm;Left arm;Chest;Abdomen;Right upper leg;Left upper leg;Face;Right lower leg;Left lower leg;Front perineal area Body parts bathed by helper: Buttocks   Assist Level: Minimal Assistance - Patient > 75% (simulated LB with AE)    Upper Body Dressing(including  orthotics)   What is the patient wearing?: Pull over shirt   Assist Level: Set up assist    Lower Body Dressing (excluding footwear)   What is the patient wearing?: Pants Assist for lower body dressing: Minimal Assistance - Patient > 75% (simulated)    Putting on/Taking off footwear    Mod I w/ sock aide         Care Tool Toileting Toileting activity    Min A     Care Tool Bed Mobility Roll left and right activity   Roll left and right assist level:  Supervision/Verbal cueing    Sit to lying activity   Sit to lying assist level: Moderate Assistance - Patient 50 - 74%    Lying to sitting on side of bed activity   Lying to sitting on side of bed assist level: the ability to move from lying on the back to sitting on the side of the bed with no back support.: Supervision/Verbal cueing     Care Tool Transfers Sit to stand transfer   Sit to stand assist level: Minimal Assistance - Patient > 75% Sit to stand assistive device: Walker  Chair/bed transfer   Chair/bed transfer assist level: Contact Guard/Touching assist Chair/bed transfer assistive device: Barrister's clerk transfer    Supervision     Care Tool Cognition  Expression of Ideas and Wants Expression of Ideas and Wants: 4. Without difficulty (complex and basic) - expresses complex messages without difficulty and with speech that is clear and easy to understand  Understanding Verbal and Non-Verbal Content Understanding Verbal and Non-Verbal Content: 4. Understands (complex and basic) - clear comprehension without cues or repetitions   Memory/Recall Ability Memory/Recall Ability : Current season;Location of own room;Staff names and faces;That he or she is in a hospital/hospital unit   Refer to Care Plan for [place] [facility] OT Short Term Goal 1 (Week 1): Pt will increase standing tolerance to 3 mins at sink for grooming/hygiene OT Short Term Goal 2 (Week 1): Pt will increase endurance to complete  ADL routine with no more than 1 rest break  Recommendations for other services: None    Skilled Therapeutic Intervention ADL ADL Eating: Independent (per staff) Grooming: Setup Upper Body Bathing: Setup Where Assessed-Upper Body Bathing: Sitting at sink Lower Body Bathing: Minimal assistance Where Assessed-Lower Body Bathing: Sitting at sink Upper Body Dressing: Setup Lower Body Dressing: Minimal assistance (simulated with AE) Toileting: Not assessed Toilet Transfer: Minimal assistance Mobility  Bed Mobility Bed Mobility: Rolling Right;Rolling Left;Supine to Sit;Sit to Supine Rolling Right: Minimal Assistance - Patient > 75% Rolling Left: Minimal Assistance - Patient > 75% Supine to Sit: Supervision/Verbal cueing Sit to Supine: Moderate Assistance - Patient 50-74% Transfers Sit to Stand: Minimal Assistance - Patient > 75% Stand to Sit: Minimal Assistance - Patient > 75%  Skilled Interventions: Pt greeted at time of eval and session sitting up in recliner agreeable to OT session and eval, see above and below for details. Pt performing sit <> stands throughout session with Min/Mod A and RW, needing assist for power up from surface. Note therapist assist with managing wound vac throughout mobility and transfers. Stand pivot recliner > wheelchair > bed throughout session with CGA after power up with RW. Set up at sink and performed UB bathe/dress with Supervision/set up and declined LB ADL but simulated with Min A overall. Pt insisting at this time needing to lay down and unable to perform any more of OT session. Sit> supine Mod A. Alarm on call bell in reach.    Discharge Criteria: Patient will be discharged from OT if patient refuses treatment 3 consecutive times without medical reason, if treatment goals not met, if there is a change in medical status, if patient makes no progress towards goals or if patient is discharged from hospital.  The above assessment, treatment plan,  treatment alternatives and goals were discussed and mutually agreed upon: by patient  Viona Gilmore 02/27/2021, 1:51 PM

## 2021-02-27 NOTE — Plan of Care (Signed)
  Problem: RH Bed Mobility Goal: LTG Patient will perform bed mobility with assist (PT) Description: LTG: Patient will perform bed mobility with assistance, with/without cues (PT). Flowsheets (Taken 02/27/2021 1349) LTG: Pt will perform bed mobility with assistance level of: Supervision/Verbal cueing   Problem: RH Car Transfers Goal: LTG Patient will perform car transfers with assist (PT) Description: LTG: Patient will perform car transfers with assistance (PT). Flowsheets (Taken 02/27/2021 1349) LTG: Pt will perform car transfers with assist:: Supervision/Verbal cueing   Problem: Sit to Stand Goal: LTG:  Patient will perform sit to stand with assistance level (PT) Description: LTG:  Patient will perform sit to stand with assistance level (PT) Flowsheets (Taken 02/27/2021 1349) LTG: PT will perform sit to stand in preparation for functional mobility with assistance level: Independent with assistive device   Problem: RH Bed to Chair Transfers Goal: LTG Patient will perform bed/chair transfers w/assist (PT) Description: LTG: Patient will perform bed to chair transfers with assistance (PT). Flowsheets (Taken 02/27/2021 1349) LTG: Pt will perform Bed to Chair Transfers with assistance level: Independent with assistive device    Problem: RH Ambulation Goal: LTG Patient will ambulate in controlled environment (PT) Description: LTG: Patient will ambulate in a controlled environment, # of feet with assistance (PT). Flowsheets (Taken 02/27/2021 1349) LTG: Pt will ambulate in controlled environ  assist needed:: Set up assist LTG: Ambulation distance in controlled environment: 300 ft with LRAD Goal: LTG Patient will ambulate in home environment (PT) Description: LTG: Patient will ambulate in home environment, # of feet with assistance (PT). Flowsheets (Taken 02/27/2021 1349) LTG: Pt will ambulate in home environ  assist needed:: Set up assist LTG: Ambulation distance in home environment: 150  ft with LRAD   Problem: RH Stairs Goal: LTG Patient will ambulate up and down stairs w/assist (PT) Description: LTG: Patient will ambulate up and down # of stairs with assistance (PT) Flowsheets (Taken 02/27/2021 1349) LTG: Pt will ambulate up/down stairs assist needed:: Contact Guard/Touching assist LTG: Pt will  ambulate up and down number of stairs: 3 steps with LRAD or HR per home setup

## 2021-02-27 NOTE — Evaluation (Signed)
Physical Therapy Assessment and Plan  Patient Details  Name: [name] MRN: [id number] Date of Birth: [birth year]  PT Diagnosis: Abnormal posture, Abnormality of gait, and Low back pain Rehab Potential: Good ELOS: 14-16 days   Today's Date: 02/27/2021 PT Individual Time: 1100-1153 PT Individual Time Calculation (min): 53 min    Hospital Problem: Principal Problem:   Lumbar disc herniation with myelopathy Active Problems:   Wound infection after surgery   Muscle spasms of both lower extremities   Hypoalbuminemia due to protein-calorie malnutrition (HCC)   Hyponatremia   Hypomagnesemia   Acute blood loss anemia   Postoperative pain   Disto-occlusion   Lumbar discitis   Epidural abscess   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HCC)   Past Medical History:  Past Medical History:  Diagnosis Date   DDD (degenerative disc disease), lumbar    Diabetes mellitus    Hyperlipemia    Hypertension    Obesity    OSA (obstructive sleep apnea)    on CPAP   Past Surgical History:  Past Surgical History:  Procedure Laterality Date   APPLICATION OF ROBOTIC ASSISTANCE FOR SPINAL PROCEDURE N/A 01/11/2021   Procedure: APPLICATION OF ROBOTIC ASSISTANCE FOR SPINAL PROCEDURE;  Surgeon: Judith Part, MD;  Location: [facility];  Service: Neurosurgery;  Laterality: N/A;   APPLICATION OF WOUND VAC N/A 01/25/2021   Procedure: APPLICATION OF WOUND VAC;  Surgeon: Dawley, Theodoro Doing, DO;  Location: [facility];  Service: Neurosurgery;  Laterality: N/A;   BACK SURGERY     COLONOSCOPY  2019   LUMBAR WOUND DEBRIDEMENT N/A 01/25/2021   Procedure: Irrigation and Debridement of lumbar wound, and wound vacuum assisted closure;  Surgeon: Dawley, Theodoro Doing, DO;  Location: [facility];  Service: Neurosurgery;  Laterality: N/A;   TRANSFORAMINAL LUMBAR INTERBODY FUSION (TLIF) WITH PEDICLE SCREW FIXATION 4 LEVEL N/A 01/11/2021   Procedure: Lumbar one-two, Lumbar two-three, Lumbar three-four, Lumbar four-five, Lumbar five Sacral one Open decompression,  Transforaminal lumbar interbody fusion, posterolateral instrumented fusion;  Surgeon: Judith Part, MD;  Location: [facility];  Service: Neurosurgery;  Laterality: N/A;    Assessment & Plan Clinical Impression:  [name] is a 55 year old male with history of T2DM, OSA, obesity. DDD with cervical and lumbar stenosis with RLE radiculopathy and significant pain limiting functional status since May 2022. He was found to have lumbar spondylosis with severe canal stenosis from L1-L2  to L4-L5.  He was admitted on 01/12/2019 for L1-S1 laminectomy with PLIF by Dr. Venetia Constable.  Hospital course was significant for issues with hypotension as well as lethargy, elevated CK with lactic acidosis, intermittent tachycardia as well as acute blood loss anemia with hyponatremia as well as drainage from back incision.  He was treated with IV fluids and hyponatremia felt to be dilutional due to fluid boluses.  His blood pressures were improving however he continued to be limited by orthostatic symptoms as well as pain with activity and poor p.o. intake.  Foley was placed due to urinary retention.he continued to be limited by pain, neuropathy, spasms BLE as well as weakness affecting mobility and ADLs. CIR was recommended due to functional decline.     He was admitted to inpatient rehab on 01/18/2021 for comprehensive intensive rehab.  He continued to have issues with abdominal and back pain with poor p.o. intake as well as ongoing serosanguineous drainage from superior aspect of wound.  He was started on doxycycline for wound prophylaxis and multiple supplements were added to help promote wound healing.  He had recurrent drop in sodium to 128 with AKI and was treated with IV fluids.  He developed hypotension with tachycardia and low-grade fevers due to SIRS on 10/10.  He was started on broad-spectrum antibiotics and pancultured with blood culture showing Kleb pneumonia.  Antibiotics narrowed to ceftriaxone per ID input.  2D  echo was done and was negative for endocarditis.  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     Lumbar spine CT was done due to ongoing back drainage and showed fluid collection soft tissues and musculature posterior to L1/S1 felt to be postop seroma as well as soft tissue fluid collection extending from T10-S1.  Neurosurgery was contacted for input patient was discharged to acute hospital for wound washout and cultures. No purulence noted and wound culture was positive for Klebsiella. Dr. West Bali recommends 6 weeks IV cefazolin from OR date of 10/14 and to follow up on outpatient basis. Back pain is improving but patient continues to be limited by weakness with poor posture and fatigue affecting functional status. Therapy ongoing and he was cleared to resume CIR.Patient transferred to CIR on 01/31/2021 .   Patient currently requires min with mobility secondary to decreased cardiorespiratoy endurance and pain.  Prior to hospitalization, patient was independent  with mobility and lived with Spouse, Daughter in a House home.  Home access is 3Stairs to enter.  Patient will benefit from skilled PT intervention to maximize safe functional mobility, minimize fall risk, and decrease caregiver burden for planned discharge home with 24 hour supervision.  Anticipate patient will benefit from follow up [place] at discharge.  PT - End of Session Activity Tolerance: Tolerates 30+ min activity with multiple rests Endurance Deficit: Yes Endurance Deficit Description: frequent rest breaks due to pain and fatigue PT Assessment Rehab Potential (ACUTE/IP ONLY): Good PT Barriers to Discharge: IV antibiotics;Wound Care;Insurance for SNF coverage PT Patient demonstrates impairments in the following area(s): Endurance;Pain;Safety PT Transfers Functional Problem(s): Bed Mobility;Bed to Chair;Car;Furniture;Floor PT Locomotion Functional Problem(s): Ambulation;Wheelchair Mobility;Stairs PT Plan PT  Intensity:  (QD) PT Frequency:  (QD) PT Duration Estimated Length of Stay: 14-16 days PT Treatment/Interventions: Ambulation/gait training;Balance/vestibular training;Community reintegration;Discharge planning;Disease management/prevention;DME/adaptive equipment instruction;Functional mobility training;Pain management;Patient/family education;Stair training;Therapeutic Activities;Therapeutic Exercise;UE/LE Strength taining/ROM;UE/LE Coordination activities PT Transfers Anticipated Outcome(s): mod I PT Locomotion Anticipated Outcome(s): ambulatory level mod I with LRAD PT Recommendation Follow Up Recommendations: Home health PT;Outpatient PT (pending needs at d/c) Patient destination: Home Equipment Recommended: None recommended by PT Equipment Details: pt has RW, awaiting delivery of w/c   PT Evaluation Precautions/Restrictions Precautions Precautions: Fall;Back;Other (comment) Precaution Comments: wound vac, pt able to recall back precautions Other Brace: no brace needed per order, d/t wound vac Restrictions Weight Bearing Restrictions: No General PT Amount of Missed Time (min): 7 Minutes PT Missed Treatment Reason: Pain Vital Signs Pain Interference Pain Interference Pain Effect on Sleep: 1. Rarely or not at all Pain Interference with Therapy Activities: 4. Almost constantly Pain Interference with Day-to-Day Activities: 4. 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 Vision/Perception  Vision - History Baseline Vision:  Wears glasses only for reading Patient Visual Report: No change from baseline Perception Perception: Within Functional Limits Praxis Praxis: Intact  Cognition Overall Cognitive Status: Within Functional Limits for tasks assessed Arousal/Alertness: Awake/alert Orientation Level: Oriented X4 Year: 2022 Month: November Day of Week: Correct Attention: Focused;Sustained Focused Attention: Appears intact Sustained Attention: Appears intact Memory: Appears intact Awareness: Appears intact Problem Solving: Appears intact Safety/Judgment: Appears intact Sensation Sensation Light Touch: Appears Intact Proprioception: Appears Intact Coordination Gross Motor Movements are Fluid and Coordinated: No Fine Motor Movements are Fluid and Coordinated: Yes Coordination and Movement Description: impaired 2/2 back pain Heel Shin Test: WFL to touch bilaterally but restricted in ROM at hips by body habitus for movement up/ down shins Motor  Motor Motor: Abnormal postural alignment and control Motor - Skilled Clinical Observations: impaired 2/2 pain  Trunk/Postural Assessment  Cervical Assessment Cervical Assessment: Exceptions to [facility] (forward head) Thoracic Assessment Thoracic Assessment: Exceptions to [facility] (rounded shoulders) Lumbar Assessment Lumbar Assessment: Exceptions to [facility] (posterior pelvic tilt) Postural Control Postural Control: Within Functional Limits  Balance Balance Balance Assessed: Yes Static Sitting Balance Static Sitting - Balance Support: No upper extremity supported;Feet supported Static Sitting - Level of Assistance: 5: Stand by assistance Dynamic Sitting Balance Dynamic Sitting - Balance Support: No upper extremity supported;Feet supported;During functional activity Dynamic Sitting - Level of Assistance: 5: Stand by assistance Static Standing Balance Static Standing - Balance  Support: Bilateral upper extremity supported;During functional activity Static Standing - Level of Assistance: 5: Stand by assistance Dynamic Standing Balance Dynamic Standing - Balance Support: Bilateral upper extremity supported;During functional activity Dynamic Standing - Level of Assistance: 4: Min assist Extremity Assessment   RLE Assessment RLE Assessment: Within Functional Limits General Strength Comments: 4/5 grossly LLE Assessment LLE Assessment: Within Functional Limits General Strength Comments: 4/5 to 5/5 grossly  Care Tool Care Tool Bed Mobility Roll left and right activity   Roll left and right assist level: Supervision/Verbal cueing    Sit to lying activity   Sit to lying assist level: Moderate Assistance - Patient 50 - 74%    Lying to sitting on side of bed activity   Lying to sitting on side of bed assist level: the ability to move from lying on the back to sitting on the side of the bed with no back support.: Supervision/Verbal cueing     Care Tool Transfers Sit to stand transfer   Sit to stand assist level: Minimal Assistance - Patient > 75% Sit to stand assistive device: Walker  Chair/bed transfer   Chair/bed transfer assist level: Contact Guard/Touching assist Chair/bed transfer assistive device: Engineering geologist transfer activity did not occur: Safety/medical concerns        Care Corporate treasurer   Assist level: Contact Guard/Touching assist Assistive device: Walker-rolling Max distance: 222'  Walk 10 feet activity   Assist level: Contact Guard/Touching assist Assistive device: Walker-rolling   Walk 50 feet with 2 turns activity   Assist level: Contact Guard/Touching assist    Walk 150 feet activity   Assist level: Contact Guard/Touching assist Assistive device: Walker-rolling  Walk 10 feet on uneven surfaces activity Walk 10 feet on uneven surfaces activity did not occur: Safety/medical concerns       Stairs Stair activity did not occur: Safety/medical concerns        Walk up/down 1 step activity Walk up/down 1 step or curb (drop down) activity did not occur: Safety/medical concerns  Walk up/down 4 steps activity Walk up/down 4 steps activity did not occur: Safety/medical concerns      Walk up/down 12 steps activity Walk up/down 12 steps activity did not occur: Safety/medical concerns      Pick up small objects from floor Pick up small object from the floor (from standing position) activity did not occur: Safety/medical concerns      Wheelchair Is the patient using a wheelchair?: Yes Type of Wheelchair: Manual   Wheelchair assist level: Dependent - Patient 0%    Wheel 50 feet with 2 turns activity   Assist Level: Dependent - Patient 0%  Wheel 150 feet activity   Assist Level: Dependent - Patient 0%    Refer to Care Plan for Long Term Goals  SHORT TERM GOAL WEEK 1 PT Short Term Goal 1 (Week 1): Pt will perform sit to stand with Supervision PT Short Term Goal 2 (Week 1): Pt will ambulate x 250 ft with LRAD at Supervision level PT Short Term Goal 3 (Week 1): Pt will perform objective measure for endurance (6MWT)  Recommendations for other services: None   Skilled Therapeutic Intervention Evaluation completed (see details above and below) with education on PT POC and goals and individual treatment initiated with focus on functional transfer and gait assessment. Pt familiar with rehab schedule and unit, educated on QD schedule. Pt received seated in recliner in room, agreeable to therapy evaluation. Pt reports ongoing pain in low back, premedicated prior to start of therapy session. Pt does not rate pain at rest, increases to 8/10 with mobility. Provided hot packs to R and L low back at end of session for pain management. Sit to stand with min A from recliner and from bed during session, increased assist needed to stand from previous rehab stay. Ambulation 2 x 222 ft with  RW and CGA, improved endurance for gait noted from previous stay. Pt does continue to exhibit flexed trunk and heavy UE reliance on RW during gait due to pain. Pt is able to stand in upright position but unable to tolerate ambulation in upright position. Standing BLE strengthening therex: marches 1 x 10 reps, 2 x 15 reps with RW and CGA for balance. Pt declines any further strengthening therex or gait due to pain and fatigue. Pt agreeable to remain seated in recliner at end of session, needs in reach. Pt missed 7 min of scheduled therapy session due to pain and fatigue.  Mobility Bed Mobility Bed Mobility: Rolling Right;Rolling Left;Supine to Sit;Sit to Supine Rolling Right: Minimal Assistance - Patient > 75% Rolling Left: Minimal Assistance - Patient > 75% Supine to Sit: Supervision/Verbal cueing Sit to Supine: Moderate Assistance - Patient 50-74% Transfers Transfers: Sit to Stand;Stand Pivot Transfers Sit to Stand: Minimal Assistance - Patient > 75% Stand to Sit: Minimal Assistance - Patient > 75% Stand Pivot Transfers: Contact Guard/Touching assist Stand Pivot Transfer Details: Verbal cues for safe use of DME/AE;Manual facilitation for weight shifting Transfer (Assistive device): Rolling walker Locomotion  Gait Gait Distance (Feet): 222 Feet Assistive device: Rolling walker Gait Gait Pattern: Impaired Gait Pattern: Decreased step length - right;Decreased step length - left;Right flexed knee in stance;Left flexed knee in stance;Trunk flexed Gait velocity: decreased Stairs / Additional Locomotion Stairs: No Wheelchair Mobility Wheelchair Mobility: No   Discharge Criteria: Patient will be discharged from PT if patient refuses treatment 3 consecutive times without medical reason, if treatment goals not met, if there is a change in medical status, if patient makes no progress towards  goals or if patient is discharged from hospital.  The above assessment, treatment plan, treatment  alternatives and goals were discussed and mutually agreed upon: by patient   Excell Seltzer, PT, DPT, CSRS 02/27/2021, 12:23 PM

## 2021-02-27 NOTE — NC FL2 (Signed)
[place] LEVEL OF CARE SCREENING TOOL     IDENTIFICATION  Patient Name: [name] Birthdate: 12-Nov-1965 Sex: male Admission Date (Current Location): 01/31/2021  [facility] and [place] Number:  Herbalist and Address:  [facility] [place], [place] [address] [place], [place] [address]      Provider Number: [id number]  Attending Physician Name and Address:  Lovorn, Jinny Blossom, MD  Relative Name and Phone Number:  [name] (wife):[phone]    Current Level of Care: Hospital Recommended Level of Care: [place] Prior Approval Number:    Date Approved/Denied:   PASRR Number: [id number]  Discharge Plan: SNF    Current Diagnoses: Patient Active Problem List   Diagnosis Date Noted   Disto-occlusion    Lumbar discitis    Epidural abscess    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[place])    Hypoalbuminemia due to protein-calorie malnutrition (HCC)    Hyponatremia    Hypomagnesemia    Acute blood loss anemia    Postoperative pain    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   Klebsiella pneumoniae infection 01/28/2021   Wound infection after surgery 01/25/2021   Muscle spasms of both lower extremities 01/25/2021   Abdominal pain 01/25/2021   Spleen enlarged 01/25/2021   Lumbar radiculopathy 01/11/2021   Spasticity 12/03/2020   Neuropathy 09/28/2020   Chronic pain syndrome 09/28/2020   Intervertebral lumbar disc disorder with myelopathy, lumbar region 09/28/2020   Spondylosis, cervical, with myelopathy 09/28/2020   Unilateral primary osteoarthritis, right knee 10/28/2019   PCP NOTES >>>>>>>> 07/09/2015   DJD (degenerative joint disease) 10/27/2014   Screening for heart disease 06/28/2012   OSA (obstructive sleep apnea) 06/14/2012   ED (erectile dysfunction) 04/26/2012   Annual physical exam 12/29/2011   Achilles tendinitis 09/10/2010   Allergic rhinitis 07/12/2010   Hyperlipidemia 04/26/2007   DM II (diabetes mellitus, type  II), controlled ([place]) 04/22/2006   OBESITY NOS 04/22/2006   Essential hypertension 04/22/2006    Orientation RESPIRATION BLADDER Height & Weight     Self, Time, Situation, Place  Normal Continent, External catheter (I/O cath 26f q4hrs) Weight: 298 lb 15.1 oz (135.6 kg) Height:  '6\' 2"'  (188 cm)  BEHAVIORAL SYMPTOMS/MOOD NEUROLOGICAL BOWEL NUTRITION STATUS      Continent Diet  AMBULATORY STATUS COMMUNICATION OF NEEDS Skin   Supervision Verbally Other (Comment), Wound Vac (MWF Vac Changes)                       Personal Care Assistance Level of Assistance  Bathing, Dressing Bathing Assistance: Limited assistance   Dressing Assistance: Limited assistance     Functional Limitations Info             SPECIAL CARE FACTORS FREQUENCY  PT (By licensed PT), OT (By licensed OT)     PT Frequency: 5xs per week OT Frequency: 5xs per week            Contractures Contractures Info: Not present    Additional Factors Info  Code Status, Allergies (MRSA infection- no contact precautions) Code Status Info: Full Allergies Info: Cymbalta (duloxetine Hcl);  Penicillins           Current Medications (02/27/2021):  This is the current hospital active medication list Current Facility-Administered Medications  Medication Dose Route Frequency Provider Last Rate Last Admin   0.9 %  sodium chloride infusion   Intravenous PRN LCourtney Heys MD 10 mL/hr at 02/23/21 1503 New Bag at 02/23/21 1503  acetaminophen (TYLENOL) tablet 325-650 mg  325-650 mg Oral Q4H PRN Bary Leriche, PA-C   650 mg at 02/14/21 2336   alum & mag hydroxide-simeth (MAALOX/MYLANTA) 200-200-20 MG/5ML suspension 30 mL  30 mL Oral Q4H PRN Bary Leriche, PA-C   30 mL at 02/13/21 1402   ascorbic acid (VITAMIN C) tablet 500 mg  500 mg Oral BID Bary Leriche, PA-C   500 mg at 02/27/21 1552   bisacodyl (DULCOLAX) suppository 10 mg  10 mg Rectal Daily PRN Bary Leriche, PA-C   10 mg at 02/26/21 1710   ceFAZolin (ANCEF)  IVPB 2g/100 mL premix  2 g Intravenous Q8H LoveIvan Anchors, PA-C 200 mL/hr at 02/27/21 0554 2 g at 02/27/21 0554   Chlorhexidine Gluconate Cloth 2 % PADS 6 each  6 each Topical Daily Lovorn, Jinny Blossom, MD   6 each at 02/26/21 1705   citalopram (CELEXA) tablet 20 mg  20 mg Oral Daily Love, Pamela S, PA-C   20 mg at 02/26/21 1705   cyclobenzaprine (FLEXERIL) tablet 10 mg  10 mg Oral TID PRN Courtney Heys, MD   10 mg at 02/26/21 0914   diazepam (VALIUM) tablet 5 mg  5 mg Oral QHS Lovorn, Megan, MD   5 mg at 02/26/21 2010   diphenhydrAMINE (BENADRYL) 12.5 MG/5ML elixir 12.5-25 mg  12.5-25 mg Oral Q6H PRN Love, Pamela S, PA-C       enoxaparin (LOVENOX) injection 40 mg  40 mg Subcutaneous Q24H Kirsteins, Luanna Salk, MD   40 mg at 02/27/21 1148   enoxaparin (LOVENOX) patient education kit   Does not apply Once Lovorn, Jinny Blossom, MD       fluticasone (FLONASE) 50 MCG/ACT nasal spray 1 spray  1 spray Each Nare Daily Lovorn, Megan, MD   1 spray at 02/27/21 1149   gabapentin (NEURONTIN) capsule 800 mg  800 mg Oral TID Lovorn, Jinny Blossom, MD   800 mg at 02/27/21 0914   guaiFENesin-dextromethorphan (ROBITUSSIN DM) 100-10 MG/5ML syrup 5-10 mL  5-10 mL Oral Q6H PRN Love, Pamela S, PA-C       HYDROmorphone (DILAUDID) injection 1 mg  1 mg Intravenous Q M,W,F Love, Pamela S, PA-C   1 mg at 02/27/21 0806   lactose free nutrition (BOOST PLUS) liquid 237 mL  237 mL Oral TID WC Lovorn, Megan, MD   237 mL at 02/26/21 1210   lidocaine (LIDODERM) 5 % 2 patch  2 patch Transdermal Q24H Lovorn, Megan, MD   2 patch at 02/26/21 2012   lidocaine (XYLOCAINE) 2 % jelly   Topical PRN Love, Pamela S, PA-C       losartan (COZAAR) tablet 100 mg  100 mg Oral Daily Reesa Chew S, PA-C   100 mg at 02/27/21 0802   menthol-cetylpyridinium (CEPACOL) lozenge 3 mg  1 lozenge Oral PRN Love, Pamela S, PA-C       Or   phenol (CHLORASEPTIC) mouth spray 1 spray  1 spray Mouth/Throat PRN Love, Pamela S, PA-C       metFORMIN (GLUCOPHAGE) tablet 1,000 mg  1,000 mg  Oral BID WC Love, Pamela S, PA-C   1,000 mg at 02/27/21 0914   morphine (MS CONTIN) 12 hr tablet 30 mg  30 mg Oral Q12H Lovorn, Megan, MD   30 mg at 02/27/21 1013   Muscle Rub CREA   Topical TID WC & HS Bary Leriche, PA-C   Given at 02/25/21 2127   oxyCODONE (Oxy IR/ROXICODONE) immediate release tablet 15 mg  15 mg Oral Q4H PRN Bary Leriche, PA-C   15 mg at 02/27/21 0913   polyethylene glycol (MIRALAX / GLYCOLAX) packet 17 g  17 g Oral Daily Bary Leriche, PA-C   17 g at 02/27/21 0915   prochlorperazine (COMPAZINE) tablet 5-10 mg  5-10 mg Oral Q6H PRN Bary Leriche, PA-C   10 mg at 02/26/21 1705   Or   prochlorperazine (COMPAZINE) injection 5-10 mg  5-10 mg Intramuscular Q6H PRN Bary Leriche, PA-C   10 mg at 02/26/21 3953   Or   prochlorperazine (COMPAZINE) suppository 12.5 mg  12.5 mg Rectal Q6H PRN Love, Pamela S, PA-C       protein supplement (RESOURCE BENEPROTEIN) powder 6 g  1 Scoop Oral TID WC Love, Pamela S, PA-C   6 g at 02/21/21 1735   rosuvastatin (CRESTOR) tablet 20 mg  20 mg Oral Daily Love, Pamela S, PA-C   20 mg at 02/26/21 1705   senna (SENOKOT) tablet 25.8 mg  3 tablet Oral BID Love, Pamela S, PA-C   25.8 mg at 02/27/21 1149   sodium chloride flush (NS) 0.9 % injection 10-40 mL  10-40 mL Intracatheter PRN Lovorn, Jinny Blossom, MD   10 mL at 02/27/21 0407   sorbitol 70 % solution 30 mL  30 mL Oral Daily PRN Cathlyn Parsons, PA-C   30 mL at 02/21/21 1537   sorbitol 70 % solution 60 mL  60 mL Oral Once Lovorn, Jinny Blossom, MD       tamsulosin (FLOMAX) capsule 0.8 mg  0.8 mg Oral Daily Reesa Chew S, PA-C   0.8 mg at 02/27/21 2023   tiZANidine (ZANAFLEX) tablet 4 mg  4 mg Oral TID Love, Pamela S, PA-C   4 mg at 02/27/21 0556   traZODone (DESYREL) tablet 25-50 mg  25-50 mg Oral QHS PRN Bary Leriche, PA-C   50 mg at 02/24/21 2330   zinc sulfate capsule 220 mg  220 mg Oral Daily Bary Leriche, PA-C   220 mg at 02/26/21 1705     Discharge Medications: Please see discharge summary  for a list of discharge medications.  Relevant Imaging Results:  Relevant Lab Results:   Additional Information [id number]  Wound care-lumbar surgical wound Measurement: 21 cm x 6.5 cm x 3.2 cm depth at superior apex of wound and 3 cm depth a base of wound  Right lateral side for twice daily: Wash with soap and water, pat dry. Apply Criticaid Clear over the areas with blisters, then sprinkle Antifungal Powder to the site.  Dressing procedure/placement/frequency: All pieces of white and black foam were removed. White foam was used to cover the wound bed, then black foam placed over that. Drape applied, bridging completed, connected to suction, immediate seal obtained.    [name], LCSW

## 2021-02-27 NOTE — Progress Notes (Signed)
Patient ID: [name], male   DOB: 01/31/1966, 55 y.o.   MRN: [id number]  HHPT/OT/SN referral declined by Angela/[facility].   SW sent referral to Angie/[facility] and waiting on follow-up.   SW sent out SNF referral to see if any potential options.  [place], MSW, [place] Office: [phone] Cell: [phone] Fax: [phone]

## 2021-02-27 NOTE — Consult Note (Signed)
[facility] Nurse wound follow up Patient receiving care in [facility] 4W16. Wound type: lumbar surgical wound Measurement: measured 02/25/21. Wound bed: dark red with some scattered yellow areas deep in the wound bed. Drainage (amount, consistency, odor) I understand staff are having to change the cannister daily. It contains dark serosanginous drainage today, which it what it contained on 02/25/21. No odor detected Periwound: the area immediately around the wound and that is covered by the drape is intact. The area to the right flank today has more fluid filled bullae despite protecting the skin with multiple Telfa pads on Monday.  Today, with the patient's agreement, I bridged the Trac pad to the upper right back. Materials management was out of Telfa pads, so I used an Oil Emulsion dressing under the Tegaderm, then placed the foam over this to secure it. I am ordering care to the right lateral side for twice daily: Wash with soap and water, pat dry. Apply Criticaid Clear over the areas with blisters, then sprinkle Antifungal Powder to the site. Dressing procedure/placement/frequency: All pieces of white and black foam were removed. White foam was used to cover the wound bed, then black foam placed over that. Drape applied, bridging completed, connected to suction, immediate seal obtained.  The patient received his IV pain med prior to my arrival.  I had a conversation with Reesa Chew, PA, and Dr. Jerilynn Mages. [place].  I expressed my concerns that the wound appears to have stagnated.  We discussed the following options:  Placing the black foam directly against the wound bed. If this is done, there is a high likelihood there will be bleeding issues again--which is why the white foam was initially started.  The white foam has prevented excessive bleeding at [facility] dressing changes, but it does not generally produce the robust granulation tissue that the black foam does (in my experience). Another option might be to switch therapy  to absorptive products such as Drawtex, Aquacel Advantage and foam dressings.  If that were done, it would likely require at least twice daily dressing changes in order to contain the drainage he is having.  Dr. Dagoberto Ligas is to reach out to his Neurosurgeon.  The approach to the wound care will not change until I hear from either Dr. Dagoberto Ligas or Reesa Chew, PA.  Val Riles, RN, MSN, CWOCN, CNS-BC, pager [phone]

## 2021-02-27 NOTE — Progress Notes (Addendum)
[place] PHYSICAL MEDICINE & REHABILITATION PROGRESS NOTE  Subjective/Complaints:   Pt reports scratchy throat- congested- clearing throat a lot- has allergies- willing to try Flonase to see if helpful.   Needs Dilaudid for VAC change- nervous about changing without meds.    ROS:   Pt denies SOB, abd pain, CP, N/V/C/D, and vision changes    Objective: Vital Signs: Blood pressure 111/62, pulse (!) 101, temperature 98.1 F (36.7 C), temperature source Oral, resp. rate 18, height '6\' 2"'  (1.88 m), weight 135.6 kg, SpO2 97 %. No results found. Recent Labs    02/25/21 0348  WBC 6.4  HGB 8.3*  HCT 26.9*  PLT 207     Recent Labs    02/25/21 0348  NA 135  K 4.6  CL 98  CO2 28  GLUCOSE 93  BUN 9  CREATININE 1.03  CALCIUM 8.5*     Intake/Output Summary (Last 24 hours) at 02/27/2021 0842 Last data filed at 02/27/2021 0834 Gross per 24 hour  Intake 708 ml  Output 1700 ml  Net -992 ml        Physical Exam: BP 111/62 (BP Location: Left Arm)   Pulse (!) 101   Temp 98.1 F (36.7 C) (Oral)   Resp 18   Ht '6\' 2"'  (1.88 m)   Wt 135.6 kg   SpO2 97%   BMI 38.38 kg/m      General: awake, alert, appropriate, NAD HENT: conjugate gaze; oropharynx moist; pale conjunctivae; trace erythema in oropharynx- clearing throat a lot- sounds allergic? CV: regular rhythm; borderline tachycardic  rate; no JVD Pulmonary: CTA B/L; no W/R/R- good air movement- slightly decreased at bases B/L  GI: soft, NT, ND, (+)BS; protuberant Psychiatric: appropriate but still flat-  Neurological: Ox3 Musculoskeletal: Still TTP over trigger points in R mid back. Next to lateral aspect/part of VAC- slightly better- also TTP over piriformis on R_ less TTP this AM Skin: Warm and dry. VAC in place- suctioning working/in place-still working- to be changed this AM Musc: Neuro: Alert Motor: Bilateral upper extremities: 5/5 proximal distal LE- HF 4/5; KE/KF 4+/5; Df/PF 4+/5  Sensation  intact   Assessment/Plan: 1. Functional deficits which require 3+ hours per day of interdisciplinary therapy in a comprehensive inpatient rehab setting. Physiatrist is providing close team supervision and 24 hour management of active medical problems listed below. Physiatrist and rehab team continue to assess barriers to discharge/monitor patient progress toward functional and medical goals   Care Tool:  Bathing    Body parts bathed by patient: Right arm, Left arm, Chest, Abdomen, Front perineal area, Right upper leg, Left upper leg, Face, Buttocks, Right lower leg, Left lower leg   Body parts bathed by helper: Buttocks, Right lower leg, Left lower leg     Bathing assist Assist Level: Supervision/Verbal cueing (per most recent staff report)     Upper Body Dressing/Undressing Upper body dressing   What is the patient wearing?: Pull over shirt    Upper body assist Assist Level: Independent (per most recent staff report, donned a hospital gown on grad day)    Lower Body Dressing/Undressing Lower body dressing      What is the patient wearing?: Pants     Lower body assist Assist for lower body dressing: Independent with assitive device     Toileting Toileting    Toileting assist Assist for toileting: Independent with assistive device (per most recent staff report)     Transfers Chair/bed transfer  Transfers assist  Chair/bed transfer activity  did not occur: Safety/medical concerns  Chair/bed transfer assist level: Independent with assistive device Chair/bed transfer assistive device: Programmer, multimedia   Ambulation assist   Ambulation activity did not occur: Safety/medical concerns  Assist level: Supervision/Verbal cueing Assistive device: Walker-rolling Max distance: 5   Walk 10 feet activity   Assist  Walk 10 feet activity did not occur: Safety/medical concerns  Assist level: Supervision/Verbal cueing Assistive device: Walker-rolling    Walk 50 feet activity   Assist Walk 50 feet with 2 turns activity did not occur: Safety/medical concerns  Assist level: Supervision/Verbal cueing Assistive device: Walker-rolling    Walk 150 feet activity   Assist Walk 150 feet activity did not occur: Safety/medical concerns  Assist level: Supervision/Verbal cueing Assistive device: Walker-rolling    Walk 10 feet on uneven surface  activity   Assist Walk 10 feet on uneven surfaces activity did not occur: Safety/medical concerns         Wheelchair     Assist Is the patient using a wheelchair?: Yes Type of Wheelchair: Manual Wheelchair activity did not occur: Safety/medical concerns  Wheelchair assist level: Total Assistance - Patient < 25% Max wheelchair distance: 150'    Wheelchair 50 feet with 2 turns activity    Assist    Wheelchair 50 feet with 2 turns activity did not occur: Safety/medical concerns   Assist Level: Total Assistance - Patient < 25%   Wheelchair 150 feet activity     Assist  Wheelchair 150 feet activity did not occur: Safety/medical concerns   Assist Level: Total Assistance - Patient < 25%    Medical Problem List and Plan: 1.  Lumbar radiculopathy/myelopathy secondary to nerve root compression- s/p decompression and wash out after bacteremia from klebsiella pneumonia 11/7- con't PT and OT- pt hasn't learned to cath yet- explained he HAS to today- con't therapies and family training. 11/9- question of LTACH discharge?- 11/12- con't PT and OT- denied from [facility] -will d/c Tuesday- after trigger point injections has appt with me 11/18 11/15- wasn't able to be discharged due to H/H issues- VAC cannot be changed outpt right now - no H/H or wound care office will take him. 11/16- still working on d/c options- restarting daily PT and OT to work on mod I  2.  Impaired mobility: continue Lovenox  Resume lovenox given that pt not undergoing IR procedure  Dr Zada Finders to f/u  11/2-  will need after d/c for a total of 2 months from original surgery. 11/7- will order lovenox injection kit- needs to do at home   11/9- will need lovenox for a total of 2 months from last surgery             -antiplatelet therapy: N/A 3. Postoperative pain: Continue Oxycodone 15 mg prn. Resumed MSContin for more consistent pain relief. Scheduled Tizanidine TID. Resumed muscle rub to right thigh as it was helping before.  10/27- will add valium 5 mg nightly for muscle spasms  10/28- feels like pain a little better- reminded has IV Dilaudid for VAC changes- will take  11/1- will increase MS Contin to 30 mg BID  11/3- lidocaine patches helpful- will go over risks/benefits of trigger point  11/8- trigger point injections today for pain control- will increase gabapentin to 800 mg TID 11/10- helpful- feels like trigger points in back breaking up but now has in R thigh- almost fell due to spasm yesterday- will do trigger point injections in R thigh today.  11/11- improved after Back injection;  more pain in R thigh after pain. 11/12- will redo injection Tuesday before d/c. 11/14- will go home on current pain regimen- I will refill for now   11/15- will do trigger point injections again today  11/16- injected R piriformis and surrounding areas x4- con't regimen for pain meds  Monitor with increased exertion 4. Mood: Team to provide ego support. LCSW to follow for evaluation and support.              -antipsychotic agents: N/A 5. Neuropsych: This patient is capable of making decisions on his own behalf. 6. Skin/Wound Care:  Monitor wound daily. Added vitamins and protein supplement to help promote wound healing.  7. Fluids/Electrolytes/Nutrition: Monitor I/Os 8. Klebsiella bacteremia w/diskitis: On Cefazolin 2 g every 8 hrs with end date 03/11/21 -- Check weekly CRP/ESR.   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  11/7- ESR 77- much better and CRP down to 7- again, better- con't regimen 11/9- needs labs weekly sent to ID 11/15- stable labs- con't regimen til 11/28 9. HTN: Monitor BP tid--continue Microzide and cozaar daily.  10/24- d/c HCTZ due to low Na   Vitals:   02/26/21 1926 02/27/21 0357  BP: 111/73 111/62  Pulse: (!) 108 (!) 101  Resp: 18 18  Temp: 98.2 F (36.8 C) 98.1 F (36.7 C)  SpO2:  97%   11/10-HR a little elevated, but BP controlled- con't regimen  11/11- having orthostatic hypotension and dizziness but heart rate up- so might need Mododrine? 11/12- doing a little better today with abd binder and TEDs/ACEp wraps 11/15- doing better- Hb down to 8.3 and pulse back up- likely from wound VAC- will con't to monitor. 11/16- will recheck CBC in AM to see if Hb still dropping- likely cause of tachycardia-   10. T2DM with hyperglycemia: CM diet. On Metformin 1000 mg bid  10/26- BG's 140s-160s usually- con't regimen  10/29- somehow CBGs aren't being done- reordered  10/30- BG's 140s-160s usually- con't regimen  CBG (last 3)  Recent Labs    02/26/21 1652 02/26/21 2104 02/27/21 0604  GLUCAP 123* 135* 105*   11/16- BG's controlled- con't regimen 11. Neurogenic bladder: with urinary retention On flomax. Increased to 0.8 mg nightly   10/24- still not voiding- will check U/A and Cx 10/26- no  voiding- U/A (-)  111/3- explained to pt he also needs to learn to cath- if wife isn't home-will ask nursing to teach.    11/7- wife insists pt will need to cath- asked OT to get mirror to teach  11/9- pt has learned to cath at least 1x  11/15- pt cathing self-  13. Neuropathy: Continue Neurontin 600 mg TID.  14. ABLA: monitor for signs of bleeding. Hgb stabilizing to 8 range.  Hb 8.9 on 10/21  10/28- Hb down to 7.7- which might be lower, since kidney function is worse- will recheck in AM  10/29- Hb 7.5- likely from hemodilution and wound VAC- con't to recheck - Monday  11/3- pt Hb down to 7.3- pulse is 104- and symptomatic- will recheck in AM, but will recheck in AM- will likely need transfusion tomorrow or Saturday.   11/4- actually, had episode where BP dropped to 79/40s yesterday with standing and HR up to 124- transfused 1 unit- Hb back up to 8.5-   11/7- Hb up to 8.9- con't to monitor Repeat Hgb in am 11/6  11/15- Hb down to 8.3- dropping with drainage from wound VAC-   11/16-labs in Am to make sure Hb stable Cont to monitor 15. 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 -- Follow up with surgery after discharge.  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  17. Abdominal pain: Much better--dysesthesias?  18. Hypomagnesemia: Resumed Mag Ox.   Mag 1.6 on 10/21, labs ordered for Monday  10/24- Mg 2.1  10/29- Mg 2.1- monitor 19. Hyponatremia  Na+ 132 on 10/21, labs ordered for Monday  10/24- Na down to 128- will do fluid restriction 1500cc- and stop HCTZ  10/25- Na 127- will recheck in AM after off HCTZ and on fluid restriction  10/26- Na up to 129- con't fluid restriction and recheck Friday  10/29- Na up to 131- recheck in AM 10/31 Na+ up to 134  11/3- Na up to 134  11/4- will reduce fluid restriction to 2000 cc/day  11/7- Na up to 135- doing better- con't 2000cc fluid restriction  11/16- Last Na 135- stable- will con't fluid restriction 20.  Hypoalbuminemia  Supplement initiated on 10/21 21. Azotemia/ARI   10/31 resolved , off IVF  , cont to monitor weekly   22 Constipation  11/8- better- con't regimen 23. Hypokalemia  10/28- will give KCL x2 40 mEq and recheck in AM- also checking Mg since can cause it to be low as well.  10/31 resolved K+ 3.9, off supplementation , repeat BMET in am  24. N/V- improved 11/6  10/30- will give antinausea medicine- KUB (-) except for constipation- has pooped 2x-  Will monitor and might need more cleaning out  11/3- will spread out AM meds to see if helps N/V.   11/10- still having intermittently- N/V- and diarrhea- IV ABX?? Will d/w pharmacy 25. Nasal/upper airway congestion  11/16- will start Flonase for allergic reaction, possibly-   Cannot d/c due to unsafe d/c currently    LOS: 27 days A FACE TO FACE EVALUATION WAS PERFORMED  [name] 02/27/2021, 8:42 AM

## 2021-02-28 LAB — CBC WITH DIFFERENTIAL/PLATELET
Abs Immature Granulocytes: 0.04 10*3/uL (ref 0.00–0.07)
Basophils Absolute: 0 10*3/uL (ref 0.0–0.1)
Basophils Relative: 0 %
Eosinophils Absolute: 0.9 10*3/uL — ABNORMAL HIGH (ref 0.0–0.5)
Eosinophils Relative: 12 %
HCT: 26.7 % — ABNORMAL LOW (ref 39.0–52.0)
Hemoglobin: 8.3 g/dL — ABNORMAL LOW (ref 13.0–17.0)
Immature Granulocytes: 1 %
Lymphocytes Relative: 10 %
Lymphs Abs: 0.7 10*3/uL (ref 0.7–4.0)
MCH: 28 pg (ref 26.0–34.0)
MCHC: 31.1 g/dL (ref 30.0–36.0)
MCV: 90.2 fL (ref 80.0–100.0)
Monocytes Absolute: 0.6 10*3/uL (ref 0.1–1.0)
Monocytes Relative: 8 %
Neutro Abs: 5.3 10*3/uL (ref 1.7–7.7)
Neutrophils Relative %: 69 %
Platelets: 234 10*3/uL (ref 150–400)
RBC: 2.96 MIL/uL — ABNORMAL LOW (ref 4.22–5.81)
RDW: 15.2 % (ref 11.5–15.5)
WBC: 7.6 10*3/uL (ref 4.0–10.5)
nRBC: 0 % (ref 0.0–0.2)

## 2021-02-28 LAB — GLUCOSE, CAPILLARY
Glucose-Capillary: 112 mg/dL — ABNORMAL HIGH (ref 70–99)
Glucose-Capillary: 122 mg/dL — ABNORMAL HIGH (ref 70–99)
Glucose-Capillary: 108 mg/dL — ABNORMAL HIGH (ref 70–99)
Glucose-Capillary: 139 mg/dL — ABNORMAL HIGH (ref 70–99)

## 2021-02-28 NOTE — Progress Notes (Signed)
Occupational Therapy Session Note  Patient Details  Name: [name] MRN: [id number] Date of Birth: [birth year]  Today's Date: 02/28/2021 OT Individual Time: 4401-0272 OT Individual Time Calculation (min): 70 min    Short Term Goals: Week 1:  OT Short Term Goal 1 (Week 1): Pt will increase standing tolerance to 3 mins at sink for grooming/hygiene OT Short Term Goal 1 - Progress (Week 1): Progressing toward goal OT Short Term Goal 2 (Week 1): Pt will increase endurance to complete ADL routine with no more than 1 rest break OT Short Term Goal 2 - Progress (Week 1): Met OT Short Term Goal 3 (Week 1): Pt will bathe wiht S OT Short Term Goal 3 - Progress (Week 1): Met  Skilled Therapeutic Interventions/Progress Updates:    Pt resting in recliner upon arrival. Pt stated he was experiencing increased back pain from sitting in recliner since earlier therapy. Pt agreeable to standing up and amb with RW in room. Pt with increased pain and IV continuously occluding. IV RN paged and disconnected IV. Pt amb in room with RW at [facility] level. Pt sat EOB for low back stretching and MFR on several trigger points on Rt and Lt. Muscle rub applied. Pt required assitance managing BLE during sit>supine. Pt able to reposition in bed. Pt remained in bed with all needs within reach and bed alarm activated.   Therapy Documentation Precautions:  Precautions Precautions: Fall, Back, Other (comment) (wound vac) Precaution Booklet Issued: No Precaution Comments: wound vac, pt able to recall back precautions Required Braces or Orthoses: Other Brace Other Brace: no brace needed per order, d/t wound vac Restrictions Weight Bearing Restrictions: No Pain:  Pt reports 7/10 Rt and Lt back pain; activity and repositioned, MFR   Therapy/Group: Individual Therapy  [name] 02/28/2021, 12:19 PM

## 2021-02-28 NOTE — Progress Notes (Signed)
[place] PHYSICAL MEDICINE & REHABILITATION PROGRESS NOTE  Subjective/Complaints:   Pt reports scratchy throat a little better.  Said L side of low back starting to act up-  Has outpt ID appt tomorrow- asked him to move/change it.  Per WOC, Sherry, Clinical cytogeneticist PA is coming tomorrow at 9:30am to see wound when [facility] removed to see other options to help it heal more.   ROS:   Pt denies SOB, abd pain, CP, N/V/C/D, and vision changes     Objective: Vital Signs: Blood pressure 110/60, pulse 98, temperature 98.2 F (36.8 C), resp. rate 17, height _0  (1.88 m), weight 135.6 kg, SpO2 95 %. No results found. Recent Labs    02/28/21 0426  WBC 7.6  HGB 8.3*  HCT 26.7*  PLT 234     No results for input(s): NA, K, CL, CO2, GLUCOSE, BUN, CREATININE, CALCIUM in the last 72 hours.    Intake/Output Summary (Last 24 hours) at 02/28/2021 0855 Last data filed at 02/28/2021 0815 Gross per 24 hour  Intake 1072 ml  Output 1075 ml  Net -3 ml        Physical Exam: BP 110/60 (BP Location: Left Arm)   Pulse 98   Temp 98.2 F (36.8 C)   Resp 17   Ht _1  (1.88 m)   Wt 135.6 kg   SpO2 95%   BMI 38.38 kg/m       General: awake, alert, appropriate, laying in bed; NAD HENT: conjugate gaze; oropharynx moist CV: borderline tachycardic, but regular rate; no JVD Pulmonary: CTA B/L; no W/R/R- good air movement GI: soft, NT, ND, (+)BS- protuberant Psychiatric: appropriate- still flat but better Neurological: Ox3  Musculoskeletal: Now also TTP over low back on L;  Still TTP over trigger points in R mid back. Next to lateral aspect/part of VAC- slightly better- also TTP over piriformis on R_ less TTP this AM Skin: Warm and dry. VAC in place- suctioning working/in place-still working- to be changed this AM Musc: Neuro: Alert Motor: Bilateral upper extremities: 5/5 proximal distal LE- HF 4/5; KE/KF 4+/5; Df/PF 4+/5  Sensation intact   Assessment/Plan: 1. Functional deficits  which require 3+ hours per day of interdisciplinary therapy in a comprehensive inpatient rehab setting. Physiatrist is providing close team supervision and 24 hour management of active medical problems listed below. Physiatrist and rehab team continue to assess barriers to discharge/monitor patient progress toward functional and medical goals   Care Tool:  Bathing    Body parts bathed by patient: Right arm, Left arm, Chest, Abdomen, Right upper leg, Left upper leg, Face, Right lower leg, Left lower leg, Front perineal area   Body parts bathed by helper: Buttocks     Bathing assist Assist Level: Minimal Assistance - Patient > 75% (simulated LB with AE)     Upper Body Dressing/Undressing Upper body dressing   What is the patient wearing?: Pull over shirt    Upper body assist Assist Level: Set up assist    Lower Body Dressing/Undressing Lower body dressing      What is the patient wearing?: Pants     Lower body assist Assist for lower body dressing: Minimal Assistance - Patient > 75% (simulated)     Toileting Toileting    Toileting assist Assist for toileting: Independent with assistive device (per most recent staff report)     Transfers Chair/bed transfer  Transfers assist  Chair/bed transfer activity did not occur: Safety/medical concerns  Chair/bed transfer assist level: Contact Guard/Touching assist  Chair/bed transfer assistive device: Museum/gallery exhibitions officer assist   Ambulation activity did not occur: Safety/medical concerns  Assist level: Contact Guard/Touching assist Assistive device: Walker-rolling Max distance: 222'   Walk 10 feet activity   Assist  Walk 10 feet activity did not occur: Safety/medical concerns  Assist level: Contact Guard/Touching assist Assistive device: Walker-rolling   Walk 50 feet activity   Assist Walk 50 feet with 2 turns activity did not occur: Safety/medical concerns  Assist level: Contact  Guard/Touching assist Assistive device: Walker-rolling    Walk 150 feet activity   Assist Walk 150 feet activity did not occur: Safety/medical concerns  Assist level: Contact Guard/Touching assist Assistive device: Walker-rolling    Walk 10 feet on uneven surface  activity   Assist Walk 10 feet on uneven surfaces activity did not occur: Safety/medical concerns         Wheelchair     Assist Is the patient using a wheelchair?: Yes Type of Wheelchair: Manual Wheelchair activity did not occur: Safety/medical concerns  Wheelchair assist level: Dependent - Patient 0% Max wheelchair distance: 150'    Wheelchair 50 feet with 2 turns activity    Assist    Wheelchair 50 feet with 2 turns activity did not occur: Safety/medical concerns   Assist Level: Dependent - Patient 0%   Wheelchair 150 feet activity     Assist  Wheelchair 150 feet activity did not occur: Safety/medical concerns   Assist Level: Dependent - Patient 0%    Medical Problem List and Plan: 1.  Lumbar radiculopathy/myelopathy secondary to nerve root compression- s/p decompression and wash out after bacteremia from klebsiella pneumonia 11/15- wasn't able to be discharged due to H/H issues- VAC cannot be changed outpt right now - no H/H or wound care office will take him. 11/18- sent FL2 for pt- con't PT and OT daily.  2.  Impaired mobility: continue Lovenox  Resume lovenox given that pt not undergoing IR procedure  Dr Zada Finders to f/u  11/2- will need after d/c for a total of 2 months from original surgery. 11/7- will order lovenox injection kit- needs to do at home   11/9- will need lovenox for a total of 2 months from last surgery             -antiplatelet therapy: N/A 3. Postoperative pain: Continue Oxycodone 15 mg prn. Resumed MSContin for more consistent pain relief. Scheduled Tizanidine TID. Resumed muscle rub to right thigh as it was helping before.  10/27- will add valium 5 mg nightly  for muscle spasms  10/28- feels like pain a little better- reminded has IV Dilaudid for VAC changes- will take  11/1- will increase MS Contin to 30 mg BID  11/3- lidocaine patches helpful- will go over risks/benefits of trigger point  11/8- trigger point injections today for pain control- will increase gabapentin to 800 mg TID 11/10- helpful- feels like trigger points in back breaking up but now has in R thigh- almost fell due to spasm yesterday- will do trigger point injections in R thigh today.  11/11- improved after Back injection; more pain in R thigh after pain. 11/12- will redo injection Tuesday before d/c. 11/14- will go home on current pain regimen- I will refill for now   11/15- will do trigger point injections again today  11/16- injected R piriformis and surrounding areas x4- con't regimen for pain meds 11/17- canot do trigger point injections today, but can try to do early next week if still  here.   Monitor with increased exertion 4. Mood: Team to provide ego support. LCSW to follow for evaluation and support.              -antipsychotic agents: N/A 5. Neuropsych: This patient is capable of making decisions on his own behalf. 6. Skin/Wound Care:  Monitor wound daily. Added vitamins and protein supplement to help promote wound healing.  7. Fluids/Electrolytes/Nutrition: Monitor I/Os 8. Klebsiella bacteremia w/diskitis: On Cefazolin 2 g every 8 hrs with end date 03/11/21 -- Check weekly CRP/ESR.   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  11/7- ESR 77- much better and CRP down to 7- again, better- con't regimen 11/9- needs labs weekly sent to ID 11/15- stable labs- con't regimen til 11/28 9. HTN: Monitor BP tid--continue Microzide and cozaar daily.  10/24- d/c HCTZ due to low Na   Vitals:   02/27/21 1942 02/28/21 0526  BP: (!) 97/59 110/60  Pulse: 98 98  Resp: 17 17  Temp: 98.7 F (37.1 C) 98.2 F (36.8 C)  SpO2: 94% 95%   11/12- doing a little better today with abd binder and TEDs/ACEp wraps 11/15- doing better- Hb down to 8.3 and pulse back up- likely from wound VAC- will con't to monitor. 11/16- will recheck CBC in AM to see if Hb still dropping- likely cause of tachycardia-  11/17- Hb stable at 8.3- for now- HR still upper 90s- 98 this AM- con't to monitor  10. T2DM with hyperglycemia: CM diet. On Metformin 1000 mg bid  10/26- BG's 140s-160s usually- con't regimen  10/29- somehow CBGs aren't being done- reordered  10/30- BG's 140s-160s usually- con't regimen  CBG (last 3)  Recent Labs    02/27/21 1650 02/27/21 2118 02/28/21 0620  GLUCAP 108* 109* 112*   11/16- BG's controlled- con't regimen 11. Neurogenic bladder: with urinary retention On flomax. Increased to 0.8 mg nightly   10/24- still not voiding- will check U/A and Cx 10/26- no voiding- U/A (-)  111/3- explained to pt he also needs to learn to cath- if wife isn't home-will ask nursing to teach.    11/7- wife insists pt will need to cath- asked OT to get mirror to teach  11/9- pt has learned to cath at least 1x  11/15- pt cathing self-  13. Neuropathy: Continue Neurontin 600 mg TID.  14. ABLA: monitor for signs of bleeding. Hgb stabilizing to 8 range.  Hb 8.9 on 10/21  10/28- Hb down to 7.7- which might be  lower, since kidney function is worse- will recheck in AM  10/29- Hb 7.5- likely from hemodilution and wound VAC- con't to recheck - Monday  11/3- pt Hb down to  7.3- pulse is 104- and symptomatic- will recheck in AM, but will recheck in AM- will likely need transfusion tomorrow or Saturday.   11/4- actually, had episode where BP dropped to 79/40s yesterday with standing and HR up to 124- transfused 1 unit- Hb back up to 8.5-   11/7- Hb up to 8.9- con't to monitor Repeat Hgb in am 11/6  11/15- Hb down to 8.3- dropping with drainage from wound VAC-   11/16-labs in Am to make sure Hb stable Cont to monitor 15. 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 -- Follow up with surgery after discharge.  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  17. Abdominal pain: Much better--dysesthesias?  18. Hypomagnesemia: Resumed Mag Ox.   Mag 1.6 on 10/21, labs ordered for Monday  10/24- Mg 2.1  10/29- Mg 2.1- monitor 19. Hyponatremia  Na+ 132 on 10/21, labs ordered for Monday  10/24- Na down to 128- will do fluid restriction 1500cc- and stop HCTZ  10/25- Na 127- will recheck in AM after off HCTZ and on fluid restriction  10/26- Na up to 129- con't fluid restriction and recheck Friday  10/29- Na up to 131- recheck in AM 10/31 Na+ up to 134  11/3- Na up to 134  11/4- will reduce fluid restriction to 2000 cc/day  11/7- Na up to 135- doing better- con't 2000cc fluid restriction  11/16- Last Na 135- stable- will con't fluid restriction 20. Hypoalbuminemia  Supplement initiated on 10/21 21. Azotemia/ARI   10/31 resolved , off IVF  , cont to monitor weekly   22 Constipation  11/8- better- con't regimen 23. Hypokalemia  10/28- will give KCL x2 40 mEq and recheck in AM- also checking Mg since can cause it to be low as well.  10/31 resolved K+ 3.9, off supplementation , repeat BMET in am  24. N/V- improved 11/6  10/30- will give antinausea medicine- KUB (-) except for  constipation- has pooped 2x-  Will monitor and might need more cleaning out  11/3- will spread out AM meds to see if helps N/V.   11/10- still having intermittently- N/V- and diarrhea- IV ABX?? Will d/w pharmacy 25. Nasal/upper airway congestion  11/16- will start Flonase for allergic reaction, possibly-   11/17- doing a little better- con't regimen   Cannot d/c due to unsafe d/c currently    LOS: 28 days A FACE TO FACE EVALUATION WAS PERFORMED  Burnett Spray 02/28/2021, 8:55 AM

## 2021-02-28 NOTE — Progress Notes (Signed)
Patient ID: [name], male   DOB: 05/28/1965, 55 y.o.   MRN: [id number]  SW waiting on updates from Angie[facility] about HHT/PT/SN referral.  *referral declined.  SW sent referral to [facility] and referral declined.   Loralee Pacas, MSW, [place] Office: [phone] Cell: [phone] Fax: [phone]

## 2021-02-28 NOTE — Progress Notes (Signed)
Physical Therapy Session Note  Patient Details  Name: [name] MRN: [id number] Date of Birth: 02/10/1966  Today's Date: 02/28/2021 PT Individual Time: 0807-0905 PT Individual Time Calculation (min): 58 min   Short Term Goals: Week 1:  PT Short Term Goal 1 (Week 1): Pt will perform sit to stand with Supervision PT Short Term Goal 2 (Week 1): Pt will ambulate x 250 ft with LRAD at Supervision level PT Short Term Goal 3 (Week 1): Pt will perform objective measure for endurance (6MWT)   Skilled Therapeutic Interventions/Progress Updates: Pt presented in bed agreeable to therapy with encouragement. Pt states pain 8/10 not yet medicated. PTA located nurse and meds received during session. Pt performed supine to sit with heavy use of bed features and supervision. Pt requesting to wash up before leaving room. Pt ambulated to sink with supervision and performed oral hygiene at UB bathing at w/c level with set up. Pt then requesting to use bathroom therefore performed ambulatory transfer to toilet and performed toilet transfers with CGA. Pt then returned to w/c for seated rest then agreeable to ambulate around nsg station/dayroom. Pt ambulated ~154f with RW and CGA. Pt continues to demonstrate significantly forward flexed posture but improving cadence. Pt agreeable to sit in recliner after session. Pt set up in recliner with call bell within reach and needs met.      Therapy Documentation Precautions:  Precautions Precautions: Fall, Back, Other (comment) (wound vac) Precaution Booklet Issued: No Precaution Comments: wound vac, pt able to recall back precautions Required Braces or Orthoses: Other Brace Other Brace: no brace needed per order, d/t wound vac Restrictions Weight Bearing Restrictions: No    Therapy/Group: Individual Therapy  Ariya Bohannon 02/28/2021, 3:30 PM

## 2021-02-28 NOTE — Progress Notes (Signed)
Patient had a vomiting episode this morning. PRN compazine per md orders given IM. Reesa Chew, PA Notified.

## 2021-03-01 ENCOUNTER — Inpatient Hospital Stay: Payer: 59 | Admitting: Infectious Diseases

## 2021-03-01 DIAGNOSIS — G062 Extradural and subdural abscess, unspecified: Secondary | ICD-10-CM

## 2021-03-01 DIAGNOSIS — S21209A Unspecified open wound of unspecified back wall of thorax without penetration into thoracic cavity, initial encounter: Secondary | ICD-10-CM

## 2021-03-01 LAB — GLUCOSE, CAPILLARY
Glucose-Capillary: 132 mg/dL — ABNORMAL HIGH (ref 70–99)
Glucose-Capillary: 145 mg/dL — ABNORMAL HIGH (ref 70–99)
Glucose-Capillary: 110 mg/dL — ABNORMAL HIGH (ref 70–99)
Glucose-Capillary: 107 mg/dL — ABNORMAL HIGH (ref 70–99)

## 2021-03-01 NOTE — Consult Note (Signed)
[facility]  Reason for Consult: Lumbar back wound Referring Physician: Dr. Dominica Severin is an 55 y.o. male.  HPI: 55 year old male with history of type 2 diabetes mellitus, OSA, obesity. 01/11/2021 patient underwent lumbar spine surgery.  He subsequently had SSI with blood culture showing Kleb pneumonia and wound culture showing the same. He subsequently underwent irrigation and debridement of lumbar wound with application of wound VAC on 01/25/2021.  Patient has been receiving wound VAC changes with assistance of [place] nursing team.  Plastic surgery was consulted for evaluation of back wound.   Patient sitting up at side of bed today with nursing team at bedside.  Past Medical History:  Diagnosis Date   DDD (degenerative disc disease), lumbar    Diabetes mellitus    Hyperlipemia    Hypertension    Obesity    OSA (obstructive sleep apnea)    on CPAP    Past Surgical History:  Procedure Laterality Date   APPLICATION OF ROBOTIC ASSISTANCE FOR SPINAL PROCEDURE N/A 01/11/2021   Procedure: APPLICATION OF ROBOTIC ASSISTANCE FOR SPINAL PROCEDURE;  Surgeon: Judith Part, MD;  Location: [place];  Service: Neurosurgery;  Laterality: N/A;   APPLICATION OF WOUND VAC N/A 01/25/2021   Procedure: APPLICATION OF WOUND VAC;  Surgeon: Dawley, Theodoro Doing, DO;  Location: [place];  Service: Neurosurgery;  Laterality: N/A;   BACK SURGERY     COLONOSCOPY  2019   LUMBAR WOUND DEBRIDEMENT N/A 01/25/2021   Procedure: Irrigation and Debridement of lumbar wound, and wound vacuum assisted closure;  Surgeon: Dawley, Theodoro Doing, DO;  Location: [place];  Service: Neurosurgery;  Laterality: N/A;   TRANSFORAMINAL LUMBAR INTERBODY FUSION (TLIF) WITH PEDICLE SCREW FIXATION 4 LEVEL N/A 01/11/2021   Procedure: Lumbar one-two, Lumbar two-three, Lumbar three-four, Lumbar four-five, Lumbar five Sacral one Open decompression, Transforaminal lumbar interbody fusion, posterolateral instrumented fusion;   Surgeon: Judith Part, MD;  Location: [place];  Service: Neurosurgery;  Laterality: N/A;    Family History  Problem Relation Age of Onset   Diabetes Mother    Hypertension Neg Hx    Coronary artery disease Neg Hx    Colon cancer Neg Hx    Prostate cancer Neg Hx     Social History:  reports that he has never smoked. He has never used smokeless tobacco. He reports that he does not drink alcohol and does not use drugs.  Allergies:  Allergies  Allergen Reactions   Cymbalta [Duloxetine Hcl] Other (See Comments)   Penicillins Other (See Comments)    REACTION: Blister and Sores in the mouth    Medications: I have reviewed the patient's current medications.  Results for orders placed or performed during the hospital encounter of 01/31/21 (from the past 48 hour(s))  Glucose, capillary     Status: Abnormal   Collection Time: 02/27/21  4:50 PM  Result Value Ref Range   Glucose-Capillary 108 (H) 70 - 99 mg/dL    Comment: Glucose reference range applies only to samples taken after fasting for at least 8 hours.  Glucose, capillary     Status: Abnormal   Collection Time: 02/27/21  9:18 PM  Result Value Ref Range   Glucose-Capillary 109 (H) 70 - 99 mg/dL    Comment: Glucose reference range applies only to samples taken after fasting for at least 8 hours.   Comment 1 Notify RN   CBC with Differential/Platelet     Status: Abnormal   Collection Time: 02/28/21  4:26 AM  Result Value Ref Range   WBC 7.6 4.0 - 10.5 K/uL   RBC 2.96 (L) 4.22 - 5.81 MIL/uL   Hemoglobin 8.3 (L) 13.0 - 17.0 g/dL   HCT 26.7 (L) 39.0 - 52.0 %   MCV 90.2 80.0 - 100.0 fL   MCH 28.0 26.0 - 34.0 pg   MCHC 31.1 30.0 - 36.0 g/dL   RDW 15.2 11.5 - 15.5 %   Platelets 234 150 - 400 K/uL   nRBC 0.0 0.0 - 0.2 %   Neutrophils Relative % 69 %   Neutro Abs 5.3 1.7 - 7.7 K/uL   Lymphocytes Relative 10 %   Lymphs Abs 0.7 0.7 - 4.0 K/uL   Monocytes Relative 8 %   Monocytes Absolute 0.6 0.1 - 1.0 K/uL   Eosinophils  Relative 12 %   Eosinophils Absolute 0.9 (H) 0.0 - 0.5 K/uL   Basophils Relative 0 %   Basophils Absolute 0.0 0.0 - 0.1 K/uL   Immature Granulocytes 1 %   Abs Immature Granulocytes 0.04 0.00 - 0.07 K/uL    Comment: Performed at [facility], [address] [place], [place] [address]  Glucose, capillary     Status: Abnormal   Collection Time: 02/28/21  6:20 AM  Result Value Ref Range   Glucose-Capillary 112 (H) 70 - 99 mg/dL    Comment: Glucose reference range applies only to samples taken after fasting for at least 8 hours.  Glucose, capillary     Status: Abnormal   Collection Time: 02/28/21 11:39 AM  Result Value Ref Range   Glucose-Capillary 122 (H) 70 - 99 mg/dL    Comment: Glucose reference range applies only to samples taken after fasting for at least 8 hours.  Glucose, capillary     Status: Abnormal   Collection Time: 02/28/21  4:59 PM  Result Value Ref Range   Glucose-Capillary 108 (H) 70 - 99 mg/dL    Comment: Glucose reference range applies only to samples taken after fasting for at least 8 hours.  Glucose, capillary     Status: Abnormal   Collection Time: 02/28/21  9:13 PM  Result Value Ref Range   Glucose-Capillary 139 (H) 70 - 99 mg/dL    Comment: Glucose reference range applies only to samples taken after fasting for at least 8 hours.  Glucose, capillary     Status: Abnormal   Collection Time: 03/01/21  6:38 AM  Result Value Ref Range   Glucose-Capillary 132 (H) 70 - 99 mg/dL    Comment: Glucose reference range applies only to samples taken after fasting for at least 8 hours.  Glucose, capillary     Status: Abnormal   Collection Time: 03/01/21 11:49 AM  Result Value Ref Range   Glucose-Capillary 145 (H) 70 - 99 mg/dL    Comment: Glucose reference range applies only to samples taken after fasting for at least 8 hours.    No results found.  Review of Systems  Constitutional: Negative.   Cardiovascular: Negative.   Musculoskeletal:  Positive for back pain.   Blood pressure 114/73, pulse 87, temperature 98.2 F (36.8 C), temperature source Oral, resp. rate 18, height 6\' 2"  (1.88 m), weight 135.6 kg, SpO2 96 %. Physical Exam Constitutional:      General: He is not in acute distress.    Appearance: He is obese. He is not toxic-appearing.  HENT:     Head: Normocephalic and atraumatic.  Pulmonary:     Effort: Pulmonary effort is normal.  Skin:  General: Skin is warm and dry.          Comments: Significant lumbar back wound noted with exposed paraspinal muscles.  No visible hardware.  No purulence is noted.  No foul odor is noted.  There is some tracking superiorly and inferiorly.  No necrotic tissue is noted.  Some fibrinous exudate noted at the wound edges.  See photo  Neurological:     Mental Status: He is alert.  Psychiatric:        Mood and Affect: Mood normal.        Behavior: Behavior normal.       Assessment/Plan: Lumbar back wound status post spine surgery with subsequent washout after bacteremia from Klebsiella pneumonia:  Recommend continuing with dressing changes with wound [facility] Monday Wednesday Friday with assistance from [facility] nursing team.  Evaluated wound today with Val Riles, RN and Dr. Claudia Desanctis.  We will plan to reevaluate on 03/11/2021 at bedside with [facility] team.  No current plans for surgical intervention, will continue to allow granulation tissue to form.  May plan for application of wound matrix depending on patient's improvement over the next 7 to 10 days.    Carola Rhine Anis Degidio, PA-C 03/01/2021, 12:58 PM

## 2021-03-01 NOTE — Progress Notes (Signed)
[facility] PHYSICAL MEDICINE & REHABILITATION PROGRESS NOTE  Subjective/Complaints:  Pt reports [name] worked on his trigger points in L low back- was really helpful- [name] off next week- also asking for more trigger point injections, but due to fluid collections on L low back/paraspinals, don't feel comfortable doing these. Educated pt on this.   Has new blisters as well on R upper side from wound VAC dressings- haven't popped.   Did vomit yesterday AM- no repetition.   Plastics coming for wound VAC change at 9:30- asked nurse to make sure he had IV Dilaudid.   ROS:   Pt denies SOB, abd pain, CP, N/V/C/D, and vision changes    Objective: Vital Signs: Blood pressure 114/73, pulse 87, temperature 98.2 F (36.8 C), temperature source Oral, resp. rate 18, height '6\' 2"'  (1.88 m), weight 135.6 kg, SpO2 96 %. No results found. Recent Labs    02/28/21 0426  WBC 7.6  HGB 8.3*  HCT 26.7*  PLT 234     No results for input(s): NA, K, CL, CO2, GLUCOSE, BUN, CREATININE, CALCIUM in the last 72 hours.    Intake/Output Summary (Last 24 hours) at 03/01/2021 0853 Last data filed at 03/01/2021 0811 Gross per 24 hour  Intake 5941.6 ml  Output 1825 ml  Net 4116.6 ml        Physical Exam: BP 114/73 (BP Location: Left Arm)   Pulse 87   Temp 98.2 F (36.8 C) (Oral)   Resp 18   Ht '6\' 2"'  (1.88 m)   Wt 135.6 kg   SpO2 96%   BMI 38.38 kg/m        General: awake, alert, appropriate, sitting up in chair at bedside;  OTA in room; NAD HENT: conjugate gaze; oropharynx moist CV: regular rate; no JVD Pulmonary: CTA B/L; no W/R/R- good air movement GI: soft, NT, ND, (+)BS- protuberant Psychiatric: appropriate- flat, but better Neurological: Ox3 Skin- blisters- not popped on side of R chest/side- a line of them from [facility] dressings- VAC in place- looks OK Musculoskeletal: Now also TTP over low back on L low back- paraspinals Still TTP over trigger points in R mid back. Next to lateral  aspect/part of VAC- slightly better- also TTP over piriformis on R_ less TTP this AM Musc: Neuro: Alert Motor: Bilateral upper extremities: 5/5 proximal distal LE- HF 4/5; KE/KF 4+/5; Df/PF 4+/5  Sensation intact   Assessment/Plan: 1. Functional deficits which require 3+ hours per day of interdisciplinary therapy in a comprehensive inpatient rehab setting. Physiatrist is providing close team supervision and 24 hour management of active medical problems listed below. Physiatrist and rehab team continue to assess barriers to discharge/monitor patient progress toward functional and medical goals   Care Tool:  Bathing    Body parts bathed by patient: Right arm, Left arm, Chest, Abdomen, Right upper leg, Left upper leg, Face, Right lower leg, Left lower leg, Front perineal area   Body parts bathed by helper: Buttocks     Bathing assist Assist Level: Minimal Assistance - Patient > 75% (simulated LB with AE)     Upper Body Dressing/Undressing Upper body dressing   What is the patient wearing?: Pull over shirt    Upper body assist Assist Level: Set up assist    Lower Body Dressing/Undressing Lower body dressing      What is the patient wearing?: Pants     Lower body assist Assist for lower body dressing: Minimal Assistance - Patient > 75% (simulated)     Toileting  Toileting    Toileting assist Assist for toileting: Independent with assistive device (per most recent staff report)     Transfers Chair/bed transfer  Transfers assist  Chair/bed transfer activity did not occur: Safety/medical concerns  Chair/bed transfer assist level: Contact Guard/Touching assist Chair/bed transfer assistive device: Programmer, multimedia   Ambulation assist   Ambulation activity did not occur: Safety/medical concerns  Assist level: Contact Guard/Touching assist Assistive device: Walker-rolling Max distance: 222'   Walk 10 feet activity   Assist  Walk 10 feet  activity did not occur: Safety/medical concerns  Assist level: Contact Guard/Touching assist Assistive device: Walker-rolling   Walk 50 feet activity   Assist Walk 50 feet with 2 turns activity did not occur: Safety/medical concerns  Assist level: Contact Guard/Touching assist Assistive device: Walker-rolling    Walk 150 feet activity   Assist Walk 150 feet activity did not occur: Safety/medical concerns  Assist level: Contact Guard/Touching assist Assistive device: Walker-rolling    Walk 10 feet on uneven surface  activity   Assist Walk 10 feet on uneven surfaces activity did not occur: Safety/medical concerns         Wheelchair     Assist Is the patient using a wheelchair?: Yes Type of Wheelchair: Manual Wheelchair activity did not occur: Safety/medical concerns  Wheelchair assist level: Dependent - Patient 0% Max wheelchair distance: 150'    Wheelchair 50 feet with 2 turns activity    Assist    Wheelchair 50 feet with 2 turns activity did not occur: Safety/medical concerns   Assist Level: Dependent - Patient 0%   Wheelchair 150 feet activity     Assist  Wheelchair 150 feet activity did not occur: Safety/medical concerns   Assist Level: Dependent - Patient 0%    Medical Problem List and Plan: 1.  Lumbar radiculopathy/myelopathy secondary to nerve root compression- s/p decompression and wash out after bacteremia from klebsiella pneumonia 11/15- wasn't able to be discharged due to H/H issues- VAC cannot be changed outpt right now - no H/H or wound care office will take him. 11/18- sent FL2- con't PT and OT daily-  2.  Impaired mobility: continue Lovenox  Resume lovenox given that pt not undergoing IR procedure  Dr Zada Finders to f/u  11/2- will need after d/c for a total of 2 months from original surgery. 11/7- will order lovenox injection kit- needs to do at home   11/9- will need lovenox for a total of 2 months from last surgery              -antiplatelet therapy: N/A 3. Postoperative pain: Continue Oxycodone 15 mg prn. Resumed MSContin for more consistent pain relief. Scheduled Tizanidine TID. Resumed muscle rub to right thigh as it was helping before.  10/27- will add valium 5 mg nightly for muscle spasms  10/28- feels like pain a little better- reminded has IV Dilaudid for VAC changes- will take  11/1- will increase MS Contin to 30 mg BID  11/3- lidocaine patches helpful- will go over risks/benefits of trigger point  11/8- trigger point injections today for pain control- will increase gabapentin to 800 mg TID 11/10- helpful- feels like trigger points in back breaking up but now has in R thigh- almost fell due to spasm yesterday- will do trigger point injections in R thigh today.  11/18- Cannot do trigger point injections on L low paraspinals due to location of fluid collection from infection- can do other locations next Tuesday-    Monitor with increased  exertion 4. Mood: Team to provide ego support. LCSW to follow for evaluation and support.              -antipsychotic agents: N/A 5. Neuropsych: This patient is capable of making decisions on his own behalf. 6. Skin/Wound Care:  Monitor wound daily. Added vitamins and protein supplement to help promote wound healing.  7. Fluids/Electrolytes/Nutrition: Monitor I/Os 8. Klebsiella bacteremia w/diskitis: On Cefazolin 2 g every 8 hrs with end date 03/11/21 -- Check weekly CRP/ESR.   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 HTN: Monitor BP tid--continue Microzide and cozaar daily.  10/24- d/c HCTZ due to low Na   Vitals:   02/28/21 1931 03/01/21 0430  BP: 117/75 114/73  Pulse: 100 87  Resp: 18 18  Temp: 98.1 F (36.7 C) 98.2 F (36.8 C)  SpO2: 99% 96%    11/18- Hb stable- Pulse actually 87 this AM- con't to monitor 10. T2DM with hyperglycemia: CM diet. On Metformin 1000 mg bid  10/26- BG's 140s-160s usually- con't regimen  10/29- somehow CBGs aren't being done- reordered  10/30- BG's 140s-160s usually- con't regimen  CBG (last 3)  Recent Labs    02/28/21 1659 02/28/21 2113 03/01/21 0638  GLUCAP 108* 139* 132*   11/18- BG's controlled- con't regimen 11. Neurogenic bladder: with urinary retention On flomax. Increased to 0.8 mg nightly   10/24- still not voiding- will check U/A and Cx 10/26- no voiding- U/A (-)  11/18- pt cathing self most of the time 13. Neuropathy: Continue Neurontin 600 mg TID.  14. ABLA: monitor for signs of bleeding. Hgb stabilizing to 8 range.  Hb 8.9 on 10/21  10/28- Hb down to 7.7- which might be lower, since kidney function is worse- will recheck in AM  10/29- Hb 7.5- likely from hemodilution and wound VAC- con't to recheck - Monday  11/3- pt Hb down to 7.3- pulse is 104- and symptomatic- will recheck in AM, but will recheck in AM- will likely need transfusion tomorrow or Saturday.   11/4- actually, had episode where BP dropped to 79/40s yesterday with standing and HR up to 124- transfused 1 unit- Hb back up to 8.5-   11/7- Hb up to 8.9- con't to monitor Repeat Hgb in am 11/6  11/15- Hb down to 8.3- dropping with drainage from wound VAC-   11/16-labs in Am to make sure Hb stable  11/18- Hb stable at 8.3- has stopped bleeding.  Cont to monitor 15. 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 -- Follow up with surgery after discharge.  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  17. Abdominal pain: Much better--dysesthesias?  18. Hypomagnesemia: Resumed Mag Ox.   Mag 1.6 on 10/21, labs  ordered for Monday  10/24- Mg 2.1  10/29- Mg 2.1- monitor 19. Hyponatremia  Na+ 132 on 10/21, labs ordered for Monday  10/24- Na down to 128- will do fluid restriction 1500cc- and stop HCTZ  10/25- Na 127- will recheck in AM after off HCTZ and on fluid restriction  10/26- Na up to 129- con't fluid restriction and recheck Friday  10/29- Na up to 131- recheck in AM 10/31 Na+ up to 134  11/3- Na up to 134  11/4- will reduce fluid restriction to 2000 cc/day  11/7- Na up to 135- doing better- con't 2000cc fluid restriction  11/16- Last Na 135- stable- will con't fluid restriction 20. Hypoalbuminemia  Supplement initiated on 10/21 21. Azotemia/ARI   10/31 resolved , off IVF  , cont to monitor weekly   22 Constipation  11/8- better- con't regimen 23. Hypokalemia  10/28- will give KCL x2 40 mEq and recheck in AM- also checking Mg since can cause it to be low as well.  10/31 resolved K+ 3.9, off supplementation , repeat BMET in am  24. N/V- improved 11/6  10/30- will give antinausea medicine- KUB (-) except for constipation- has pooped 2x-  Will monitor and might need more cleaning out  11/3- will spread out AM meds to see if helps N/V.   11/10- still having intermittently- N/V- and diarrhea- IV ABX?? Will d/w pharmacy  11/18- vomited again yesterday- comes out of nowhere- cannot find source? 25. Nasal/upper airway congestion  11/16- will start Flonase for allergic reaction, possibly-   11/17- doing a little better- con't regimen   Cannot d/c due to unsafe d/c currently    LOS: 29 days A FACE TO [place] 03/01/2021, 8:53 AM

## 2021-03-01 NOTE — Progress Notes (Signed)
Physical Therapy Session Note  Patient Details  Name: [name] MRN: [id number] Date of Birth: [birth year]  Today's Date: 03/01/2021 PT Individual Time: 0900-0926 PT Individual Time Calculation (min): 26 min   Short Term Goals: Week 1:  PT Short Term Goal 1 (Week 1): Pt will perform sit to stand with Supervision PT Short Term Goal 2 (Week 1): Pt will ambulate x 250 ft with LRAD at Supervision level PT Short Term Goal 3 (Week 1): Pt will perform objective measure for endurance (6MWT)   Skilled Therapeutic Interventions/Progress Updates: Tx1: Pt presented in recliner agreeable to therapy. Pt states pain 6/10 and agreeable to ambulation for endurance. Pt performed Sit to stand with minA and x 2 attempts, pushing with BUE but continues to have heavy reliance on BUE on RW to push to upright posture. Pt ambulated x 2 rounds around nsg station and day room. However pt did require x 1 seated rest at EOB between bouts. Pt noted to have improving B knee extension in stance phase this date. Upon return to room pt sat at [facility] with [place] nurse present in preparation for wound vac change. Pt left sitting EOB with WOC and his regular nurse present.   Tx 2: Pt presented in bed, stating significantly increased pain due to wound vac being changed. Pt declining any OOB activity this session due to pain. Pt missed 30 min skilled PT due to pain.      Therapy Documentation Precautions:  Precautions Precautions: Fall, Back, Other (comment) (wound vac) Precaution Booklet Issued: No Precaution Comments: wound vac, pt able to recall back precautions Required Braces or Orthoses: Other Brace Other Brace: no brace needed per order, d/t wound vac Restrictions Weight Bearing Restrictions: No General: PT Amount of Missed Time (min): 30 Minutes PT Missed Treatment Reason: Pain   Therapy/Group: Individual Therapy  Lander Eslick Jalexus Brett, PTA ' 03/01/2021, 12:33 PM

## 2021-03-01 NOTE — Progress Notes (Signed)
Occupational Therapy Session Note  Patient Details  Name: [name] MRN: [id number] Date of Birth: 1965/11/24  Today's Date: 03/01/2021 OT Individual Time: 8502-7741 OT Individual Time Calculation (min): 60 min    Short Term Goals: Week 1:  OT Short Term Goal 1 (Week 1): Pt will increase standing tolerance to 3 mins at sink for grooming/hygiene OT Short Term Goal 1 - Progress (Week 1): Progressing toward goal OT Short Term Goal 2 (Week 1): Pt will increase endurance to complete ADL routine with no more than 1 rest break OT Short Term Goal 2 - Progress (Week 1): Met OT Short Term Goal 3 (Week 1): Pt will bathe wiht S OT Short Term Goal 3 - Progress (Week 1): Met  Skilled Therapeutic Interventions/Progress Updates:    Pt eating breakfast upon arrival. Pt finished eating breakfast before engaging in therapy (pt missed 15 mins skilled OT services.) Supine>sit EOB with supervision with HOB and bed elevated. Pt supported upper body with BUE on RW while OTA performed trigger point MFR for pain mgmt. Pt reported relief when standing and amb with RW. Pt amb with RW to w/c at sink and completed bathing/dressing tasks without assistance. Pt amb with RW in room and to recliner. Pt remained in recliner with all needs within reach.   Therapy Documentation Precautions:  Precautions Precautions: Fall, Back, Other (comment) (wound vac) Precaution Booklet Issued: No Precaution Comments: wound vac, pt able to recall back precautions Required Braces or Orthoses: Other Brace Other Brace: no brace needed per order, d/t wound vac Restrictions Weight Bearing Restrictions: No General: General OT Amount of Missed Time: 15 Minutes  Pain: Pt reports 5/10 low back pain (trigger points); repositioned, activity, and MFR   Therapy/Group: Individual Therapy  [name] 03/01/2021, 10:31 AM

## 2021-03-01 NOTE — Progress Notes (Addendum)
Patient ID: [name], male   DOB: 1966-01-27, 55 y.o.   MRN: [id number]  SW called [facility] [id number]: [phone]) to see if any cancellations, and there are not any. They continue to keep cancellation list in the event there are any slots available.   SW spoke with [name] (p:[phone]/f:[phone]) who reported she will follow-up once she hears from the branch. Did not anticipate that branch is likely to accept but will follow-up.  *referral declined as branch does not have nursing for two weeks and then losing a nurse thereafter.   SW spoke with pt wife  [name] to discuss continued barriers with HHA and possibly exploring SNF for wound care, however, SNF would be based on if insurance approves for placement. Reports she will be here later on today so she and husband can discuss further.   SW sent referral to [facility] and [facility] and waiting on follow-up.   *SW met with pt in room to provide updates from insurance in which insurance declined his stay and last covered day is 11/21 and a peer to peer can be completed. SW discussed SNF as possible option for wound care needs to be met as well. He prefers to avoid transferring to prevent risk of infection elsewhere. He would like peer to peer to be completed. SW explained we will wait updates from insurance. SW left message for pt wife [name] to inform on above.   SW provided pt wife with SNF list for Medicare.gov and insurance.   SW updated medical team.    Declined HHAs Angie/[facility] Erica/[facility]- no PT/SN at this time [place], MSW, [place] Office: [phone] Cell: [phone] Fax: [phone]

## 2021-03-01 NOTE — Consult Note (Addendum)
[place] Nurse wound follow up Patient receiving care in [facility] 4W16. Dr. Claudia Desanctis with Plastic Surgery, and Rochel Brome, PA-C also with Plastic Surgery at bedside to evaluation the lumbar wound and collaborate on treatment. Additionally, 2 nursing students present and assisted with VAC dressing application by holding VAC foams in place. Wound type: surgical Measurement: na Wound bed: red, granulation tissue Drainage (amount, consistency, odor) 450 ml of dark serosanginous drainage in cannister. I placed a new cannister at 10:00 and there is a spare in the room. Periwound: intact. Protected with 106M skin barrier film wipes. Fluid filled bullae on left lateral side receiving topical therapy. Dressing procedure/placement/frequency: All white and black foam pieces removed from wound bed. Today, in collaboration with Dr. Claudia Desanctis, I placed white foam in the undermined areas at 1 o'clock and 5 o'clock, then placed an Oil emulsion dressing over the wound bed, then filled the wound bed with black foam.  The dressing was then bridged to the right upper back as done on Wednesday. No bullae present there.  Dr. Claudia Desanctis plans to have someone from Plastic Surgery services see the patient the Monday after Thanksgiving; that would be 11/28.  If the above approach allows for dressing changes without excessive bleeding, this is the approach he recommends.  If Mepitel is available, that can be used in the wound bed instead of the Oil Emulsion dressing.  All supplies are in the patient's room.  The patient tolerated the dressing change very well.  Next week we go back to the pre-arranged 0830 time for the procedure on MWF. Val Riles, RN, MSN, CWOCN, CNS-BC, pager [phone]

## 2021-03-02 ENCOUNTER — Inpatient Hospital Stay: Payer: Self-pay

## 2021-03-02 ENCOUNTER — Inpatient Hospital Stay (HOSPITAL_COMMUNITY): Payer: 59

## 2021-03-02 DIAGNOSIS — T8149XA Infection following a procedure, other surgical site, initial encounter: Secondary | ICD-10-CM

## 2021-03-02 DIAGNOSIS — K5901 Slow transit constipation: Secondary | ICD-10-CM

## 2021-03-02 LAB — GLUCOSE, CAPILLARY
Glucose-Capillary: 101 mg/dL — ABNORMAL HIGH (ref 70–99)
Glucose-Capillary: 111 mg/dL — ABNORMAL HIGH (ref 70–99)
Glucose-Capillary: 104 mg/dL — ABNORMAL HIGH (ref 70–99)
Glucose-Capillary: 101 mg/dL — ABNORMAL HIGH (ref 70–99)

## 2021-03-02 IMAGING — DX DG CHEST 1V PORT
2 series · 2 of 2 positions shown · non-contrast
Comparison: [DATE] interval placement of right

CLINICAL DATA: PICC line placement

EXAM:
PORTABLE CHEST 1 VIEW

[chest ap (1 of 2)]
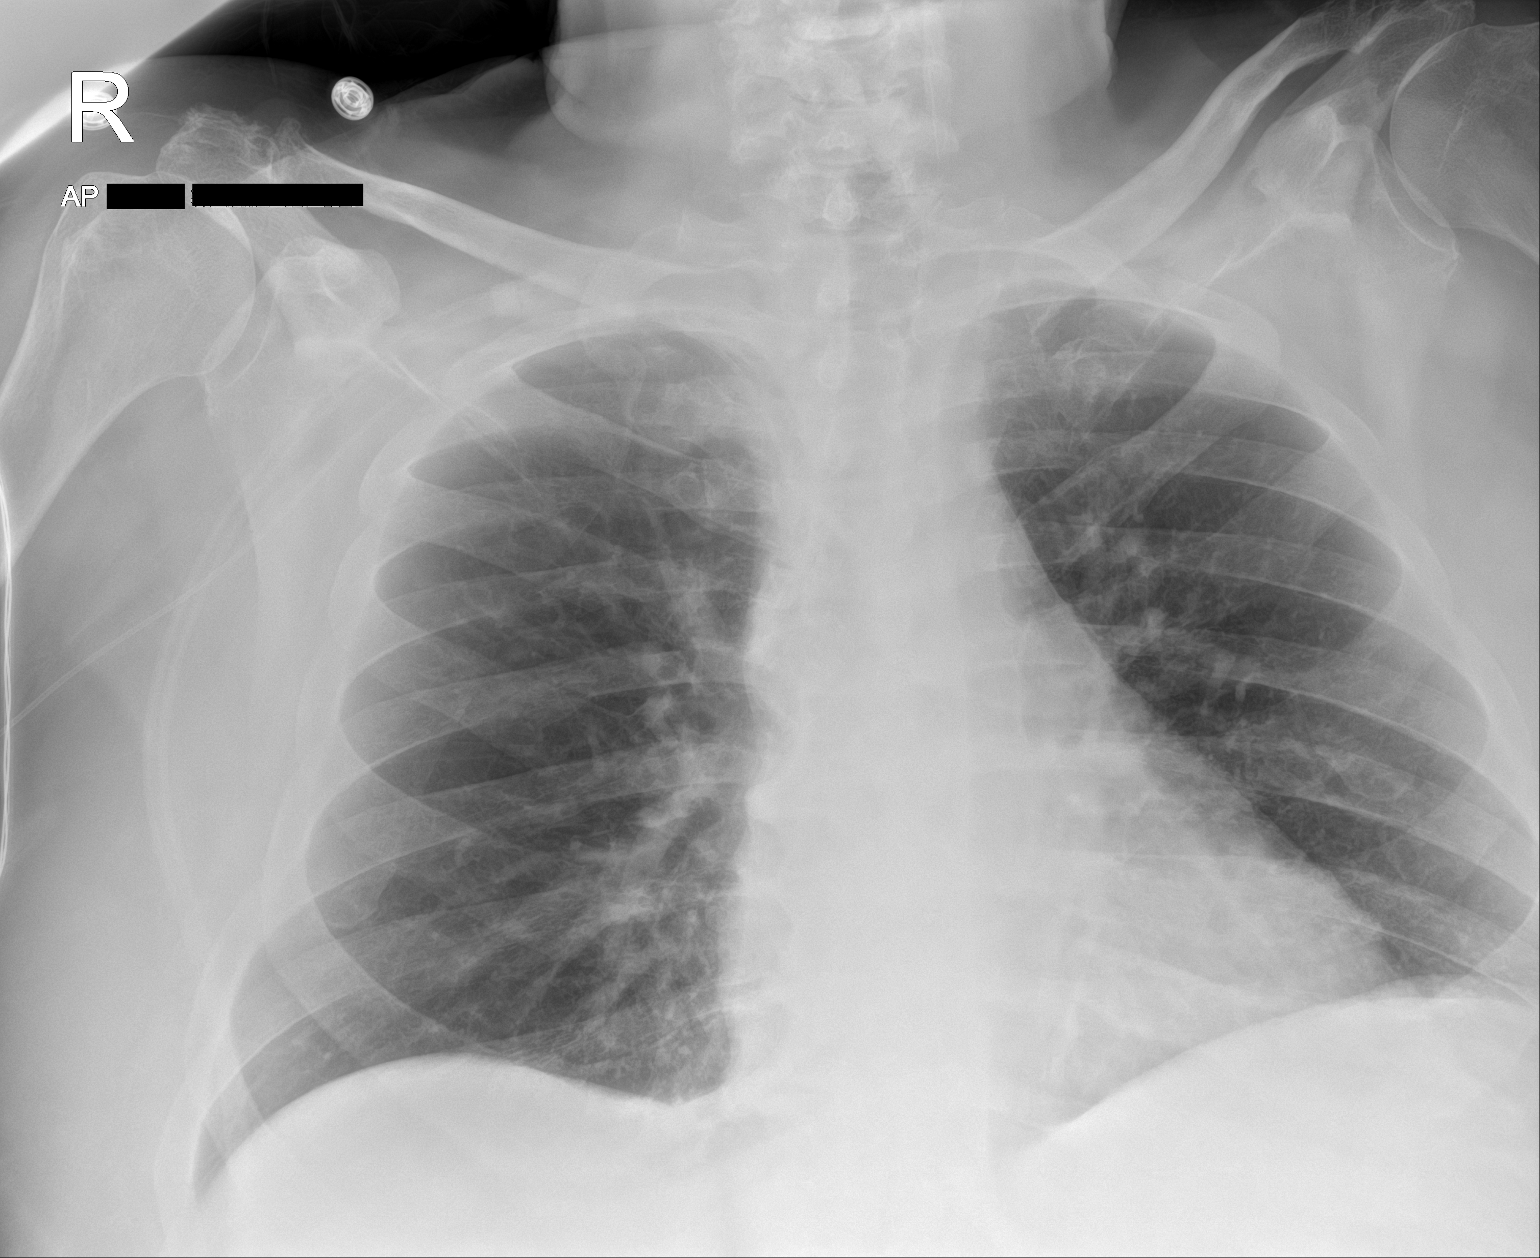

[chest ap (2 of 2)]
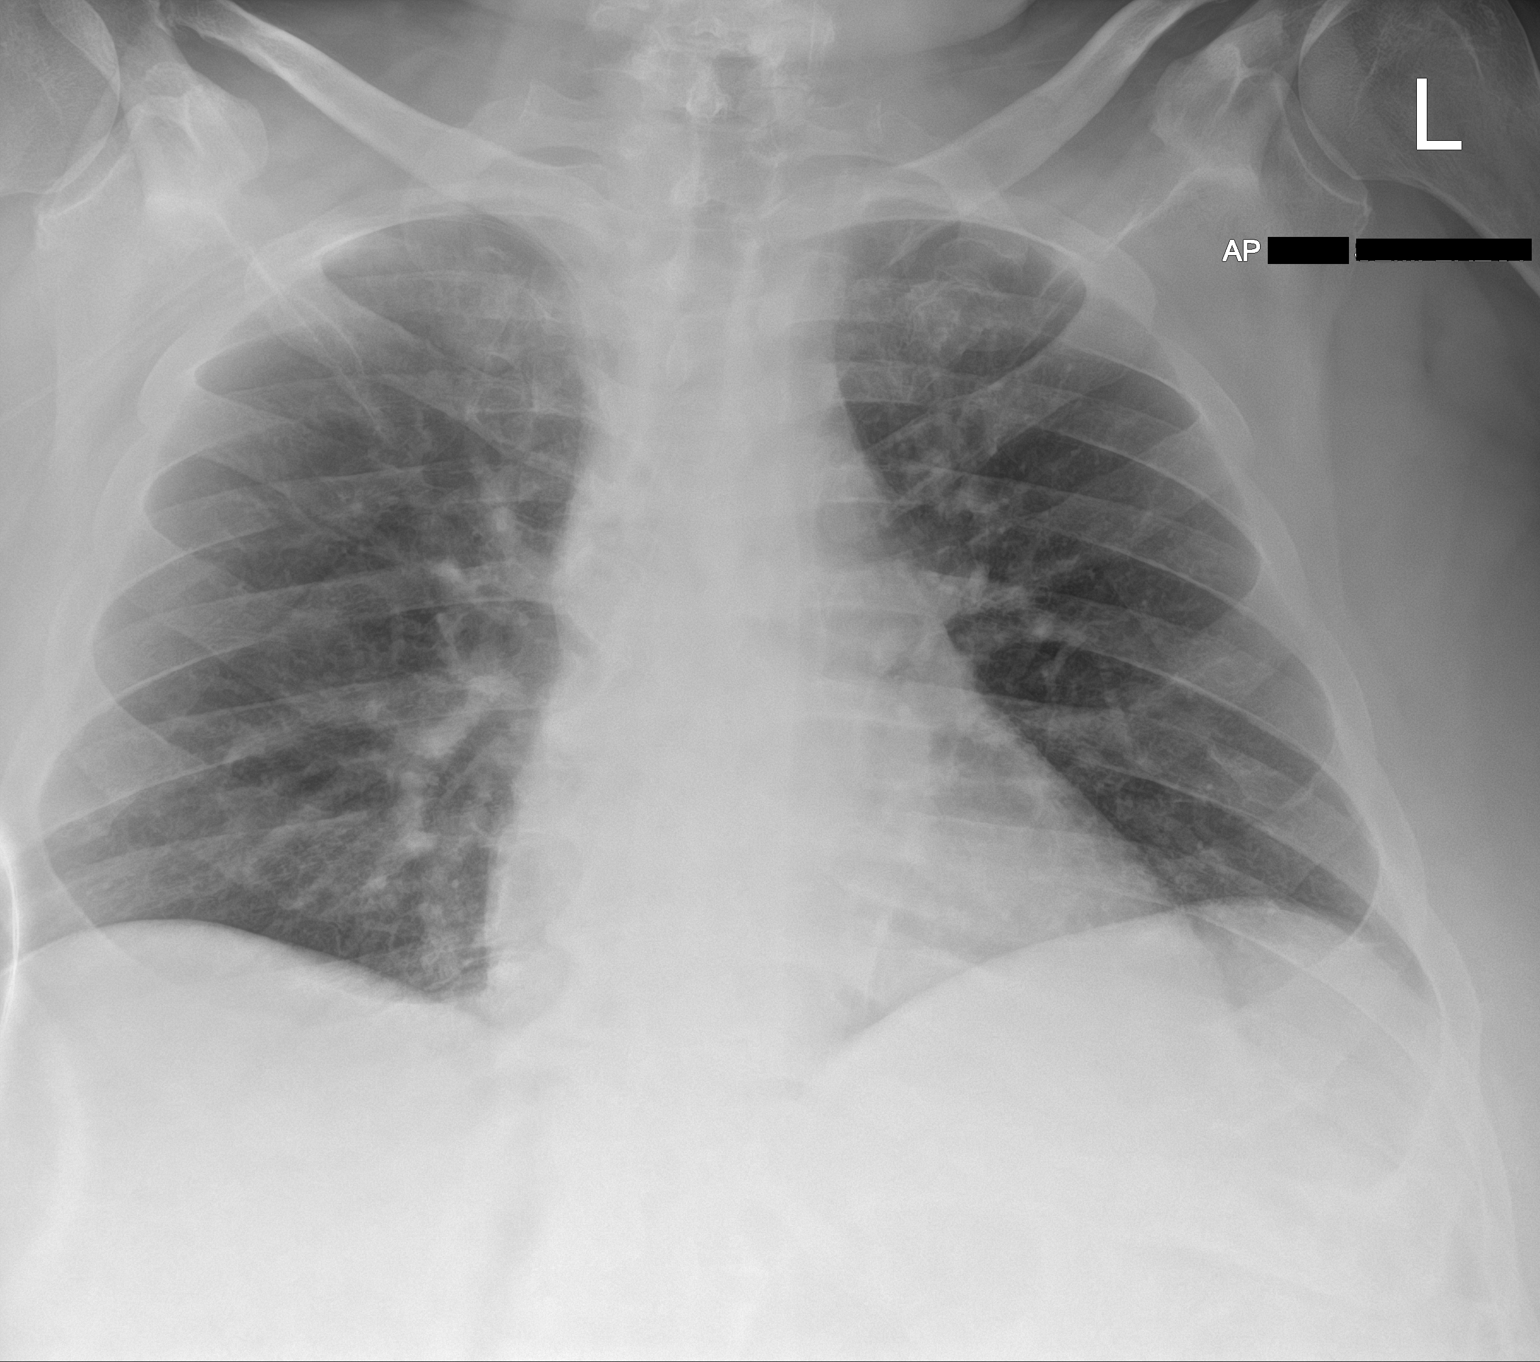

[2 of 2 positions shown; findings below may reference images not displayed]

FINDINGS: Upper extremity PICC line with distal tip terminating in the region
of the proximal SVC. The heart size and mediastinal contours are
within normal limits. Low lung volumes. Mild left basilar
atelectasis. No pleural effusion or pneumothorax. The visualized
skeletal structures are unremarkable.
IMPRESSION: 1. PICC line tip terminating in the region of the proximal SVC.
2. Low lung volumes with mild left basilar atelectasis.

## 2021-03-02 MED ORDER — POLYETHYLENE GLYCOL 3350 17 G PO PACK
17.0000 g | PACK | Freq: Two times a day (BID) | ORAL | Status: DC
  Administered 2021-03-02 – 2021-03-07 (×9): 17 g via ORAL
  Filled 2021-03-02 (×11): qty 1

## 2021-03-02 MED ORDER — SODIUM CHLORIDE 0.9% FLUSH: 10.0000 mL | INTRAVENOUS | Status: DC | PRN

## 2021-03-02 MED ORDER — SODIUM CHLORIDE 0.9% FLUSH
10.0000 mL | Freq: Two times a day (BID) | INTRAVENOUS | Status: DC
  Administered 2021-03-05 – 2021-03-07 (×3): 10 mL

## 2021-03-02 NOTE — Progress Notes (Addendum)
[name] PHYSICAL MEDICINE & REHABILITATION PROGRESS NOTE  Subjective/Complaints: Patient seen laying in bed this morning.  He states he slept well overnight.  He notes that he is constipated.  He is questions regarding infection.  He was seen by plastics yesterday, notes reviewed-recommendations to continue VAC.  ROS: Denies CP, SOB, N/V/D   Objective: Vital Signs: Blood pressure 109/65, pulse 95, temperature 98.3 F (36.8 C), resp. rate 16, height '6\' 2"'  (1.88 m), weight 135.6 kg, SpO2 97 %. No results found. Recent Labs    02/28/21 0426  WBC 7.6  HGB 8.3*  HCT 26.7*  PLT 234      No results for input(s): NA, K, CL, CO2, GLUCOSE, BUN, CREATININE, CALCIUM in the last 72 hours.    Intake/Output Summary (Last 24 hours) at 03/02/2021 1215 Last data filed at 03/02/2021 1028 Gross per 24 hour  Intake 780 ml  Output 1400 ml  Net -620 ml         Physical Exam: BP 109/65 (BP Location: Left Arm)   Pulse 95   Temp 98.3 F (36.8 C)   Resp 16   Ht '6\' 2"'  (1.88 m)   Wt 135.6 kg   SpO2 97%   BMI 38.38 kg/m  Constitutional: No distress . Vital signs reviewed. HENT: Normocephalic.  Atraumatic. Eyes: EOMI. No discharge. Cardiovascular: No JVD.  RRR. Respiratory: Normal effort.  No stridor.  Bilateral clear to auscultation. GI: Non-distended.  BS +. Skin: Warm and dry.  + VAC. Psych: Normal mood.  Normal behavior. Musc: No edema in extremities.  No tenderness in extremities. Neuro: Alert Motor: Bilateral upper extremities: 5/5 proximal distal LE- HF 4/5; KE/KF 4+/5; Df/PF 4+/5, unchanged  Assessment/Plan: 1. Functional deficits which require 3+ hours per day of interdisciplinary therapy in a comprehensive inpatient rehab setting. Physiatrist is providing close team supervision and 24 hour management of active medical problems listed below. Physiatrist and rehab team continue to assess barriers to discharge/monitor patient progress toward functional and medical  goals   Care Tool:  Bathing    Body parts bathed by patient: Right arm, Left arm, Chest, Abdomen, Right upper leg, Left upper leg, Face, Right lower leg, Left lower leg, Front perineal area   Body parts bathed by helper: Buttocks     Bathing assist Assist Level: Minimal Assistance - Patient > 75% (simulated LB with AE)     Upper Body Dressing/Undressing Upper body dressing   What is the patient wearing?: Pull over shirt    Upper body assist Assist Level: Set up assist    Lower Body Dressing/Undressing Lower body dressing      What is the patient wearing?: Pants     Lower body assist Assist for lower body dressing: Minimal Assistance - Patient > 75% (simulated)     Toileting Toileting    Toileting assist Assist for toileting: Independent with assistive device (per most recent staff report)     Transfers Chair/bed transfer  Transfers assist  Chair/bed transfer activity did not occur: Safety/medical concerns  Chair/bed transfer assist level: Contact Guard/Touching assist Chair/bed transfer assistive device: Programmer, multimedia   Ambulation assist   Ambulation activity did not occur: Safety/medical concerns  Assist level: Contact Guard/Touching assist Assistive device: Walker-rolling Max distance: 222'   Walk 10 feet activity   Assist  Walk 10 feet activity did not occur: Safety/medical concerns  Assist level: Contact Guard/Touching assist Assistive device: Walker-rolling   Walk 50 feet activity   Assist Walk 50 feet  with 2 turns activity did not occur: Safety/medical concerns  Assist level: Contact Guard/Touching assist Assistive device: Walker-rolling    Walk 150 feet activity   Assist Walk 150 feet activity did not occur: Safety/medical concerns  Assist level: Contact Guard/Touching assist Assistive device: Walker-rolling    Walk 10 feet on uneven surface  activity   Assist Walk 10 feet on uneven surfaces activity did  not occur: Safety/medical concerns         Wheelchair     Assist Is the patient using a wheelchair?: Yes Type of Wheelchair: Manual Wheelchair activity did not occur: Safety/medical concerns  Wheelchair assist level: Dependent - Patient 0% Max wheelchair distance: 150'    Wheelchair 50 feet with 2 turns activity    Assist    Wheelchair 50 feet with 2 turns activity did not occur: Safety/medical concerns   Assist Level: Dependent - Patient 0%   Wheelchair 150 feet activity     Assist  Wheelchair 150 feet activity did not occur: Safety/medical concerns   Assist Level: Dependent - Patient 0%    Medical Problem List and Plan: 1.  Lumbar radiculopathy/myelopathy secondary to nerve root compression- s/p decompression and wash out after bacteremia from klebsiella pneumonia 11/15- wasn't able to be discharged due to H/H issues- VAC cannot be changed outpt right now - no H/H or wound care office will take him. Continue CIR 2.  Impaired mobility: continue Lovenox  Resumed lovenox  Dr Zada Finders to f/u   11/2- will need after d/c for a total of 2 months from original surgery. -antiplatelet therapy: N/A 3. Postoperative pain: Continue Oxycodone 15 mg prn. Scheduled Tizanidine TID.   valium 5 mg nightly for muscle spasms IV Dilaudid for VAC changes  Increased MS Contin to 30 mg BID  lidocaine patches Gabapentin to 800 mg TID Trigger point injections  Controlled with meds on 11/19 4. Mood: Team to provide ego support. LCSW to follow for evaluation and support.              -antipsychotic agents: N/A 5. Neuropsych: This patient is capable of making decisions on his own behalf. 6. Skin/Wound Care:  Monitor wound daily. Added vitamins and protein supplement to help promote wound healing.  7. Fluids/Electrolytes/Nutrition: Monitor I/Os 8. Klebsiella bacteremia w/diskitis: On Cefazolin 2 g every 8 hrs with end date 03/11/21 Check weekly CRP/ESR.  10/31 - upon further  review of MRI , IR feels Left psoas fluid is edema rather than abscess, no drainage recommended 9. HTN: Monitor BP tid--continue Microzide and cozaar daily.   Vitals:   03/01/21 2007 03/02/21 0633  BP: 120/66 109/65  Pulse: 92 95  Resp: 17 16  Temp: 98.4 F (36.9 C) 98.3 F (36.8 C)  SpO2: 93% 97%   Controlled on 11/19 10. T2DM with hyperglycemia: CM diet. On Metformin 1000 mg bid  CBG (last 3)  Recent Labs    03/01/21 2121 03/02/21 0632 03/02/21 1156  GLUCAP 107* 101* 111*    Relatively controlled on 11/19 11. Neurogenic bladder: with urinary retention On flomax. Increased to 0.8 mg nightly   UA negative   11/18- pt cathing self most of the time 13. Neuropathy: Continue Neurontin 600 mg TID.  14. ABLA: monitor for signs of bleeding. Hgb stabilizing to 8 range.   11/18- Hb stable at 8.3- has stopped bleeding.  Cont to monitor 15. 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 -- Follow up with surgery after discharge.  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  17. Abdominal pain: Much better--dysesthesias?  18. Hypomagnesemia: Resumed Mag Ox.   10/29- Mg 2.1- monitor 19. Hyponatremia  10/24- fluid restriction 1500cc- and stop HCTZ   Sodium 135 on 11/14 20. Hypoalbuminemia  Supplement initiated on 10/21 21. Azotemia/ARI  10/31 resolved , off IVF  , cont to monitor weekly   Creatinine 1.03 on 11/14 22. Constipation  Improving on 11/19 23. Hypokalemia: Resolved 24. N/V- improved 11/6  KUB (-) except for constipation Will monitor and might need more cleaning out  ?  Improving 25. Nasal/upper airway congestion  11/16- will start Flonase for allergic reaction, possibly-   11/17- doing a little better- con't regimen  Improving  LOS: 30 days A FACE TO FACE EVALUATION WAS PERFORMED  [name] Lorie Phenix 03/02/2021, 12:15 PM

## 2021-03-02 NOTE — Progress Notes (Signed)
Peripherally Inserted Central Catheter Placement  The IV Nurse has discussed with the patient and/or persons authorized to consent for the patient, the purpose of this procedure and the potential benefits and risks involved with this procedure.  The benefits include less needle sticks, lab draws from the catheter, and the patient may be discharged home with the catheter. Risks include, but not limited to, infection, bleeding, blood clot (thrombus formation), and puncture of an artery; nerve damage and irregular heartbeat and possibility to perform a PICC exchange if needed/ordered by physician.  Alternatives to this procedure were also discussed.  Bard Power PICC patient education guide, fact sheet on infection prevention and patient information card has been provided to patient /or left at bedside.  PICC consent in chart from previous PICC placement.  Pt agreeable to procedure and verbalizes understanding.  PICC Placement Documentation  PICC Single Lumen 17/49/44 Right Basilic 43 cm 0 cm (Active)  Indication for Insertion or Continuance of Line Home intravenous therapies (PICC only) 03/02/21 1500  Exposed Catheter (cm) 0 cm 03/02/21 1500  Site Assessment Clean;Dry;Intact 03/02/21 1500  Line Status Flushed;Saline locked;Blood return noted 03/02/21 1500  Dressing Type Transparent;Securing device 03/02/21 1500  Dressing Status Clean;Dry;Intact 03/02/21 1500  Antimicrobial disc in place? Yes 03/02/21 1500  Safety Lock Not Applicable 96/75/91 6384  Line Care Tubing changed;Connections checked and tightened 03/02/21 1500  Line Adjustment (NICU/IV Team Only) No 03/02/21 1500  Dressing Intervention New dressing 03/02/21 1500  Dressing Change Due 03/09/21 03/02/21 1500       Rolena Infante 03/02/2021, 4:00 PM

## 2021-03-02 NOTE — Progress Notes (Signed)
Physical Therapy Session Note  Patient Details  Name: [name] MRN: [id number] Date of Birth: 17-Feb-1966  Today's Date: 03/02/2021 PT Individual Time: 1025-1055 PT Individual Time Calculation (min): 30 min   Today's Date: 03/02/2021 PT Missed Time: 30 Minutes Missed Time Reason: Nursing care  Short Term Goals: Week 2:  PT Short Term Goal 1 (Week 2): =LTG due to ELOS PT Short Term Goal 1 - Progress (Week 2): Progressing toward goal Week 3:  PT Short Term Goal 1 (Week 3): =LTG due to ELOS  Skilled Therapeutic Interventions/Progress Updates:   Received pt semi-reclined in bed, pt agreeable to PT treatment, and denied any pain at rest but reported being fatigued from not sleeping well last night. Pt requesting in/out cath prior to participating in therapy - notified NT. Upon therapist's return pt ready for therapy. Session with emphasis on functional mobility, endurance, and gait training. Donned shoes with max A and pt transferred sit<>stand from low sitting recliner with RW and light min A. Pt ambulated 132ft x 2 trials with RW and CGA to/from dayroom with total A to manage equipment. Pt ambulates with significantly flexed trunk/downward gaze, heavy reliance on BUE support, and decreased bilateral foot clearance. Pt required extensive seated rest break then performed x 3 additional sit<>stands from elevated EOM with CGA and performed heel raises 2x10 with BUE support and CGA with emphasis on LE strength - mild increase in low back pain with activity. Pt requested to return to bed at end of session and doffed shoes with total A and transferred sit<>supine with heavy mod A for BLE management. Concluded session with pt semi-reclined in bed, needs within reach, and bed alarm on. 30 minutes missed of skilled physical therapy due to nursing care.   Therapy Documentation Precautions:  Precautions Precautions: Fall, Back, Other (comment) (wound vac) Precaution Booklet Issued: No Precaution  Comments: wound vac, pt able to recall back precautions Required Braces or Orthoses: Other Brace Other Brace: no brace needed per order, d/t wound vac Restrictions Weight Bearing Restrictions: No  Therapy/Group: Individual Therapy Alfonse Alpers PT, DPT   03/02/2021, 7:42 AM

## 2021-03-03 LAB — GLUCOSE, CAPILLARY
Glucose-Capillary: 78 mg/dL (ref 70–99)
Glucose-Capillary: 131 mg/dL — ABNORMAL HIGH (ref 70–99)
Glucose-Capillary: 105 mg/dL — ABNORMAL HIGH (ref 70–99)
Glucose-Capillary: 127 mg/dL — ABNORMAL HIGH (ref 70–99)

## 2021-03-03 MED ORDER — MAGNESIUM HYDROXIDE 400 MG/5ML PO SUSP
30.0000 mL | Freq: Once | ORAL | Status: AC
  Administered 2021-03-03: 30 mL via ORAL
  Filled 2021-03-03: qty 30

## 2021-03-03 MED ORDER — BISACODYL 10 MG RE SUPP
10.0000 mg | Freq: Once | RECTAL | Status: AC
Start: 2021-03-03 — End: 2021-03-03
  Administered 2021-03-03: 10 mg via RECTAL
  Filled 2021-03-03: qty 1

## 2021-03-03 MED ORDER — SIMETHICONE 80 MG PO CHEW
80.0000 mg | CHEWABLE_TABLET | Freq: Once | ORAL | Status: AC
  Administered 2021-03-03: 80 mg via ORAL
  Filled 2021-03-03: qty 1

## 2021-03-03 NOTE — Progress Notes (Signed)
Patient had large semi loose bowel movement last night. Patient reports still feeling constipated and abdominal discomfort. Bowel sounds present x4. MD Posey Pronto notified on morning rounds. New order received for one time dose of milk of mag followed by a suppository 45 min later. Milk of mag given along with PRN compazine for nausea at 0648. Reported off to on coming nurse.

## 2021-03-04 ENCOUNTER — Encounter: Payer: 59 | Admitting: Physical Medicine and Rehabilitation

## 2021-03-04 LAB — BASIC METABOLIC PANEL
Anion gap: 10 (ref 5–15)
BUN: 13 mg/dL (ref 6–20)
CO2: 29 mmol/L (ref 22–32)
Calcium: 8.5 mg/dL — ABNORMAL LOW (ref 8.9–10.3)
Chloride: 96 mmol/L — ABNORMAL LOW (ref 98–111)
Creatinine, Ser: 1.3 mg/dL — ABNORMAL HIGH (ref 0.61–1.24)
GFR, Estimated: >60 mL/min (ref >60)
Glucose, Bld: 117 mg/dL — ABNORMAL HIGH (ref 70–99)
Potassium: 4.9 mmol/L (ref 3.5–5.1)
Sodium: 135 mmol/L (ref 135–145)

## 2021-03-04 LAB — C-REACTIVE PROTEIN: CRP: 10.3 mg/dL — ABNORMAL HIGH (ref <1.0)

## 2021-03-04 LAB — CBC
HCT: 24.5 % — ABNORMAL LOW (ref 39.0–52.0)
Hemoglobin: 7.5 g/dL — ABNORMAL LOW (ref 13.0–17.0)
MCH: 27.6 pg (ref 26.0–34.0)
MCHC: 30.6 g/dL (ref 30.0–36.0)
MCV: 90.1 fL (ref 80.0–100.0)
Platelets: 220 10*3/uL (ref 150–400)
RBC: 2.72 MIL/uL — ABNORMAL LOW (ref 4.22–5.81)
RDW: 15.2 % (ref 11.5–15.5)
WBC: 6.7 10*3/uL (ref 4.0–10.5)
nRBC: 0 % (ref 0.0–0.2)

## 2021-03-04 LAB — GLUCOSE, CAPILLARY
Glucose-Capillary: 103 mg/dL — ABNORMAL HIGH (ref 70–99)
Glucose-Capillary: 155 mg/dL — ABNORMAL HIGH (ref 70–99)
Glucose-Capillary: 152 mg/dL — ABNORMAL HIGH (ref 70–99)
Glucose-Capillary: 110 mg/dL — ABNORMAL HIGH (ref 70–99)

## 2021-03-04 LAB — SEDIMENTATION RATE: Sed Rate: 115 mm/hr — ABNORMAL HIGH (ref 0–16)

## 2021-03-04 MED ORDER — OXYCODONE HCL 5 MG PO TABS: 10.0000 mg | ORAL_TABLET | Freq: Once | ORAL | Status: DC | PRN

## 2021-03-04 MED ORDER — CEFAZOLIN IV (FOR PTA / DISCHARGE USE ONLY): 2.0000 g | Freq: Three times a day (TID) | INTRAVENOUS | 0 refills | Status: DC

## 2021-03-04 MED ORDER — LORAZEPAM 2 MG/ML IJ SOLN
1.0000 mg | Freq: Once | INTRAMUSCULAR | Status: AC | PRN
  Administered 2021-03-05: 1 mg via INTRAVENOUS
  Filled 2021-03-04: qty 1

## 2021-03-04 MED ORDER — JUVEN PO PACK
1.0000 | PACK | Freq: Two times a day (BID) | ORAL | Status: DC
  Administered 2021-03-04 – 2021-03-07 (×6): 1 via ORAL
  Filled 2021-03-04 (×5): qty 1

## 2021-03-04 NOTE — Progress Notes (Signed)
Discussed patient with Dr. Tommy Medal in anticipation of discharge today--He would like patient to continue IV antibiotics till evaluated by Dr. Yvette Rack. Rescheduled appointment set for Dec 1st at 9:30 am (instead of 2:45pm) o allow for adequate time with WOC appt in [place].   Rx date adjusted and left message for [place] for IV antibiotics.   Reached out to [facility] nurse regarding hands on education with wife. Rescheduled GU appointment for Dec 5th due to multiple appointments on Dec 1st.

## 2021-03-04 NOTE — Progress Notes (Signed)
[place] PHYSICAL MEDICINE & REHABILITATION PROGRESS NOTE  Subjective/Complaints:   Pt's IV ABX been moved out to 12/1 and to f/u with ID- also wound VAC changed today- still trying to make arrangements for pt to get VAC to be done at home- since there is literally no H/H agency that can or will take on pt's care at home.   We have to get to 12/1 since has Wound care clinic appt on 12/1 for them to take over his VAC.   Pt and wife are concerned appropriately, that he's unsafe to be discharged without any assistance from H/H at home.    ROS:  Pt denies SOB, abd pain, CP, N/V/C/D, and vision changes    Objective: Vital Signs: Blood pressure 121/70, pulse (!) 101, temperature 98.3 F (36.8 C), resp. rate 19, height '6\' 2"'  (1.88 m), weight 135.6 kg, SpO2 96 %. [name] EKG SITE RITE  Result Date: 03/02/2021 If Site Rite image not attached, placement could not be confirmed due to current cardiac rhythm.  Recent Labs    03/04/21 0345  WBC 6.7  HGB 7.5*  HCT 24.5*  PLT 220     Recent Labs    03/04/21 0345  NA 135  K 4.9  CL 96*  CO2 29  GLUCOSE 117*  BUN 13  CREATININE 1.30*  CALCIUM 8.5*      Intake/Output Summary (Last 24 hours) at 03/04/2021 1342 Last data filed at 03/04/2021 0651 Gross per 24 hour  Intake --  Output 1150 ml  Net -1150 ml        Physical Exam: BP 121/70 (BP Location: Left Arm)   Pulse (!) 101   Temp 98.3 F (36.8 C)   Resp 19   Ht '6\' 2"'  (1.88 m)   Wt 135.6 kg   SpO2 96%   BMI 38.38 kg/m    General: awake, alert, appropriate, sitting up in bedside chair; nursing in room; NAD HENT: conjugate gaze; oropharynx moist CV: regular rhythm; borderline tachycardic rate; no JVD Pulmonary: CTA B/L; no W/R/R- good air movement GI: soft, NT, ND, (+)BS- protuberant Psychiatric: appropriate- flat, but interactive Neurological: Ox3  Skin: Warm and dry.  + VAC. On lumbar incision- very long/large MS- very TTP in trigger points that are palpable  in L and R lumbar paraspinals Neuro: Alert Motor: Bilateral upper extremities: 5/5 proximal distal LE- HF 4/5; KE/KF 4+/5; Df/PF 4+/5, unchanged  Assessment/Plan: 1. Functional deficits which require 3+ hours per day of interdisciplinary therapy in a comprehensive inpatient rehab setting. Physiatrist is providing close team supervision and 24 hour management of active medical problems listed below. Physiatrist and rehab team continue to assess barriers to discharge/monitor patient progress toward functional and medical goals   Care Tool:  Bathing    Body parts bathed by patient: Right arm, Left arm, Chest, Abdomen, Right upper leg, Left upper leg, Face, Right lower leg, Left lower leg, Front perineal area   Body parts bathed by helper: Buttocks     Bathing assist Assist Level: Minimal Assistance - Patient > 75% (simulated LB with AE)     Upper Body Dressing/Undressing Upper body dressing   What is the patient wearing?: Pull over shirt    Upper body assist Assist Level: Set up assist    Lower Body Dressing/Undressing Lower body dressing      What is the patient wearing?: Pants     Lower body assist Assist for lower body dressing: Minimal Assistance - Patient > 75% (simulated)  Toileting Toileting    Toileting assist Assist for toileting: Independent with assistive device (per most recent staff report)     Transfers Chair/bed transfer  Transfers assist  Chair/bed transfer activity did not occur: Safety/medical concerns  Chair/bed transfer assist level: Supervision/Verbal cueing Chair/bed transfer assistive device: Programmer, multimedia   Ambulation assist   Ambulation activity did not occur: Safety/medical concerns  Assist level: Supervision/Verbal cueing Assistive device: Walker-rolling Max distance: 100'   Walk 10 feet activity   Assist  Walk 10 feet activity did not occur: Safety/medical concerns  Assist level: Supervision/Verbal  cueing Assistive device: Walker-rolling   Walk 50 feet activity   Assist Walk 50 feet with 2 turns activity did not occur: Safety/medical concerns  Assist level: Supervision/Verbal cueing Assistive device: Walker-rolling    Walk 150 feet activity   Assist Walk 150 feet activity did not occur: Safety/medical concerns  Assist level: Contact Guard/Touching assist Assistive device: Walker-rolling    Walk 10 feet on uneven surface  activity   Assist Walk 10 feet on uneven surfaces activity did not occur: Safety/medical concerns         Wheelchair     Assist Is the patient using a wheelchair?: Yes Type of Wheelchair: Manual Wheelchair activity did not occur: Safety/medical concerns  Wheelchair assist level: Dependent - Patient 0% Max wheelchair distance: 150'    Wheelchair 50 feet with 2 turns activity    Assist    Wheelchair 50 feet with 2 turns activity did not occur: Safety/medical concerns   Assist Level: Dependent - Patient 0%   Wheelchair 150 feet activity     Assist  Wheelchair 150 feet activity did not occur: Safety/medical concerns   Assist Level: Dependent - Patient 0%    Medical Problem List and Plan: 1.  Lumbar radiculopathy/myelopathy secondary to nerve root compression- s/p decompression and wash out after bacteremia from klebsiella pneumonia 11/15- wasn't able to be discharged due to H/H issues- VAC cannot be changed outpt right now - no H/H or wound care office will take him. Con't PT and OT/CIR 2.  Impaired mobility: continue Lovenox  Resumed lovenox  Dr Zada Finders to f/u   11/2- will need after d/c for a total of 2 months from original surgery. -antiplatelet therapy: N/A 3. Postoperative pain: Continue Oxycodone 15 mg prn. Scheduled Tizanidine TID.   valium 5 mg nightly for muscle spasms IV Dilaudid for VAC changes  Increased MS Contin to 30 mg BID  lidocaine patches Gabapentin to 800 mg TID Trigger point  injections  11/21- explained to pt will not have IV Dilaudid for home VAC changes- will try to do trigger point injections tomorrow. If possible 4. Mood: Team to provide ego support. LCSW to follow for evaluation and support.              -antipsychotic agents: N/A 5. Neuropsych: This patient is capable of making decisions on his own behalf. 6. Skin/Wound Care:  Monitor wound daily. Added vitamins and protein supplement to help promote wound healing.  7. Fluids/Electrolytes/Nutrition: Monitor I/Os 8. Klebsiella bacteremia w/diskitis: On Cefazolin 2 g every 8 hrs with end date 03/11/21 Check weekly CRP/ESR.  10/31 - upon further review of MRI , IR feels Left psoas fluid is edema rather than abscess, no drainage recommended 11/21- ID just changed his IV ABX til 12/1- will con't and recheck labs qmonday- ESR up to 115 and CRP- 10.3- which is why his ABX just got extended.  9. HTN: Monitor BP tid--continue  Microzide and cozaar daily.   Vitals:   03/03/21 2053 03/04/21 0552  BP: 121/71 121/70  Pulse: (!) 103 (!) 101  Resp: 18 19  Temp: 98.4 F (36.9 C) 98.3 F (36.8 C)  SpO2: 97% 96%   11/21- HR borderline high- will monitor esp in setting of Hb dorpping, likely due to VAC 10. T2DM with hyperglycemia: CM diet. On Metformin 1000 mg bid  CBG (last 3)  Recent Labs    03/03/21 2136 03/04/21 0557 03/04/21 1200  GLUCAP 127* 103* 155*   Relatively controlled on 11/19 11. Neurogenic bladder: with urinary retention On flomax. Increased to 0.8 mg nightly   UA negative   11/18- pt cathing self most of the time 13. Neuropathy: Continue Neurontin 600 mg TID.  14. ABLA: monitor for signs of bleeding. Hgb stabilizing to 8 range.   11/18- Hb stable at 8.3- has stopped bleeding.   11/21- Hb down to 7.5 AND is dry- will recheck in AM since VAC changed today to see if needs transfusion Cont to monitor 15. 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 -- Follow up with surgery  after discharge.  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  17. Abdominal pain: Much better--dysesthesias?  18. Hypomagnesemia: Resumed Mag Ox.   10/29- Mg 2.1- monitor 19. Hyponatremia  10/24- fluid restriction 1500cc- and stop HCTZ   Sodium 135 on 11/14  11/21- Na 135- but need to stop fluid restriction due to Cr up to 1.3 20. Hypoalbuminemia  Supplement initiated on 10/21 21. Azotemia/ARI  10/31 resolved , off IVF  , cont to monitor weekly   Creatinine 1.03 on 11/14 22. Constipation  Improving on 11/19 23. Hypokalemia: Resolved 24. N/V- improved 11/6  KUB (-) except for constipation Will monitor and might need more cleaning out  ?  Improving 25. Nasal/upper airway congestion  11/16- will start Flonase for allergic reaction, possibly-   11/17- doing a little better- con't regimen  Improving 26. Pt is not safe for d/c with dropping Hb. And Kidney function back up to 1.30- will recheck labs in AM   I spent a total of 39 minutes on visit- specifically taking to PA, PT, and wound nurse as well as SW>. About d/c.   LOS: 32 days A FACE TO FACE EVALUATION WAS PERFORMED  Brandon Scarbrough 03/04/2021, 1:42 PM

## 2021-03-04 NOTE — Progress Notes (Signed)
Physical Therapy Session Note  Patient Details  Name: [name] MRN: [id number] Date of Birth: Jun 06, 1965  Today's Date: 03/04/2021 PT Individual Time: 0800-0830 PT Individual Time Calculation (min): 30 min   Short Term Goals: Week 3:  PT Short Term Goal 1 (Week 3): =LTG due to ELOS  Skilled Therapeutic Interventions/Progress Updates:    Pt received seated in recliner in room, reports wound vac dressing change scheduled at 0830 but agreeable to participate in session until wound vac nurse arrives. Pt reports 7/10 pain in R and L lower back, no increase in pain with mobility and pain being treated via medication and lidocaine patches. Sit to stand with min A to RW from recliner. Pt exhibits increase in flexed posture this date as well as flexed knees in standing, heavy UE reliance on RW during standing and gait due to pain. Pt able to stand upright with trunk in neutral position but unable to maintain position due to pain. Ambulation x 100 ft in patient room with RW and Supervision, assist needed for management of IV pole and wound vac. Pt left seated EOB in room in care of nursing and wound vac nurse for dressing change at end of session. Pt missed 30 min of scheduled therapy session due to scheduling conflict with wound vac nursing.  Therapy Documentation Precautions:  Precautions Precautions: Fall, Back, Other (comment) (wound vac) Precaution Booklet Issued: No Precaution Comments: wound vac, pt able to recall back precautions Required Braces or Orthoses: Other Brace Other Brace: no brace needed per order, d/t wound vac Restrictions Weight Bearing Restrictions: No General: PT Amount of Missed Time (min): 30 Minutes PT Missed Treatment Reason: Nursing care      Therapy/Group: Individual Therapy   Excell Seltzer, PT, DPT, CSRS  03/04/2021, 8:36 AM

## 2021-03-04 NOTE — Progress Notes (Addendum)
Patient ID: [name], male   DOB: 1965/10/07, 55 y.o.   MRN: [id number]  Per attending, unable to support P2P. Reports family do not want a SNF and would like to go home. Per insurance, pt will need to discharge to home today.   SW met with pt in room to discuss above. Pt confirms no SNF placement and understands SW will make contact with his wife. SW left message for pt wife [name] to discuss discharge to home today and waiting on follow-up.   *SW met with pt wife [name] to discuss above and pt discharge date today. Reports nursing friends have volunteered to assist with wound care dressing changes. States she also needs education on how to perform wound care, and would like to know other options. SW informed she has the right to appeal pt discharge date. Reports she would like to appeal his discharge date. SW explained coverage charges for anything beyond 11/21  for request for wound care education. SW also explained if appeal is not approved, he could be responsible for hospital bill.   SW left message for [facility] [phone] ext [phone])  to request updates on how the family can appeal the discharge date. SW waiting on follow-up.  SW spoke with [facility] CM [phone] ES#[phone]) to discuss family appeal options. Reports that a letter to appeal discharge date and continued review can be faxed [place], [phone]. Reiterated that pt can remain in the hospital during time of appeal, however, if appeal is denied will be charged for any stay beyond 11/21. SW met with pt wife to inform on above. She intends to speak with her husband and will follow-up with SW. SW informed medical team.   *SW received updates from pt wife reporting they will appeal his discharge date. SW received return phone call from [facility] [phone]) to discuss above. SW explained above in length and physician discussion on how unable to support and family is not willing t ogo to SNF. States a  request will need to be sent to [employer]. Reports she will follow-up if there is any more information.  SW faxed request to Appeal Dept.   SW spoke with Pam/[facility] with regard to above. Reports she will see if the [facility] is able to see pt this if he were to discharge as this is a holiday week. It is likely there will not be any flexibility. SW will updated once there is a discharge date.  Loralee Pacas, MSW, [place] Office: [phone] Cell: [phone] Fax: [phone]

## 2021-03-04 NOTE — Consult Note (Signed)
[place] Nurse wound follow up Patient receiving care in [facility] 4W16 Plan is to go home at this point. Date unknown.  Wound type: Surgical lumbar  Wound bed: Pink red granulation tissue with minimal yellow fibrinous tissue.  Drainage (amount, consistency, odor) Serosanginous drainage in cannister.  Periwound: Much improvement in blistering. Right side blisters have scabbed over and dried, healing. Dressing procedure/placement/frequency: 2 pieces of white foam removed. 1 pieced a petrolatum based gauze removed. 1 piece of black foam from the wound and 1 piece of black foam from the bridged area. 2 pieced of white foam used in the undermining areas at the distal and proximal end of the wound. 1 piece of Mepitel placed over the wound and then covered with 1 piece black foam. Bridged to the upper back with one piece of black foam. Drape applied, immediate suction obtained at 125 mmHg. Patient premedicated and tolerated the procedure well.  More supplies ordered to be placed at bedside for Wednesday.  WOC will follow M/W/F  Jocelyn Lamer L. Tamala Julian, MSN, RN, [place], [place]le Pearl, [facility] Wound Treatment Associate Pager [phone]

## 2021-03-04 NOTE — Progress Notes (Signed)
Occupational Therapy Session Note  Patient Details  Name: [name] MRN: [id number] Date of Birth: Aug 31, 1965  Today's Date: 03/04/2021 OT Individual Time: 2353-6144 OT Individual Time Calculation (min): 72 min    Short Term Goals: Week 1:  OT Short Term Goal 1 (Week 1): Pt will increase standing tolerance to 3 mins at sink for grooming/hygiene OT Short Term Goal 1 - Progress (Week 1): Progressing toward goal OT Short Term Goal 2 (Week 1): Pt will increase endurance to complete ADL routine with no more than 1 rest break OT Short Term Goal 2 - Progress (Week 1): Met OT Short Term Goal 3 (Week 1): Pt will bathe wiht S OT Short Term Goal 3 - Progress (Week 1): Met Week 2:  OT Short Term Goal 1 (Week 2): STG=LTG 2/2 ELOS (continue working towards supervision/mod I LTG) Week 3:  OT Short Term Goal 1 (Week 3): STG=LTG 2/2 ELOS (continue working towards supervision/mod I LTG)  Skilled Therapeutic Interventions/Progress Updates:    Pt greeted at time of session up in recliner agreeable to OT session with 6/10 pain at rest. Agreeable to try to participate despite fatigue and pain. Politely declined ADL. Stand pivot recliner > wheelchair with Min for sit <> stand and Supervision for transfer with bariwalker. Therapist providing assist with managing wound vac and IV pole throughout, taking extended time for management for all activities and transfers. Transported room <> gym with total A and set up on SCIFIT on level 2 for 6 mins total switching directions half way through and a rest break. STM performed at upper trap/rhomboid and lats as able to tolerate, pt stating this made pain much more tolerable. Switched to BUE strengthening with 3# dowel for 2x12-15 of the following: bicep curl, chest press, overhead press, bicep curl with rest breaks in between sets. Transported back to room and needing to toilet, walked short distance in room with Supervision to commode and therapist assist managing IV/wound  vac. Pt requesting assist with clothing management, did not want to attempt himself. Pt seated on commode, nursing aware to hand off 2/2 time constraints.   Therapy Documentation Precautions:  Precautions Precautions: Fall, Back, Other (comment) (wound vac) Precaution Booklet Issued: No Precaution Comments: wound vac, pt able to recall back precautions Required Braces or Orthoses: Other Brace Other Brace: no brace needed per order, d/t wound vac Restrictions Weight Bearing Restrictions: No      Therapy/Group: Individual Therapy  Viona Gilmore 03/04/2021, 4:11 PM

## 2021-03-04 NOTE — Progress Notes (Signed)
Patient ID: [name], male   DOB: [birth year], 55 y.o.   MRN: [id number] Supplies for Wound vac dressing changes x 5  days ordered per specs from [facility] and delivered to the patient's room. Margarito Liner

## 2021-03-05 ENCOUNTER — Inpatient Hospital Stay (HOSPITAL_COMMUNITY): Payer: 59

## 2021-03-05 LAB — CBC WITH DIFFERENTIAL/PLATELET
Abs Immature Granulocytes: 0.05 10*3/uL (ref 0.00–0.07)
Basophils Absolute: 0 10*3/uL (ref 0.0–0.1)
Basophils Relative: 0 %
Eosinophils Absolute: 0.9 10*3/uL — ABNORMAL HIGH (ref 0.0–0.5)
Eosinophils Relative: 11 %
HCT: 27.4 % — ABNORMAL LOW (ref 39.0–52.0)
Hemoglobin: 8.5 g/dL — ABNORMAL LOW (ref 13.0–17.0)
Immature Granulocytes: 1 %
Lymphocytes Relative: 13 %
Lymphs Abs: 1 10*3/uL (ref 0.7–4.0)
MCH: 27.9 pg (ref 26.0–34.0)
MCHC: 31 g/dL (ref 30.0–36.0)
MCV: 89.8 fL (ref 80.0–100.0)
Monocytes Absolute: 0.8 10*3/uL (ref 0.1–1.0)
Monocytes Relative: 10 %
Neutro Abs: 5 10*3/uL (ref 1.7–7.7)
Neutrophils Relative %: 65 %
Platelets: 257 10*3/uL (ref 150–400)
RBC: 3.05 MIL/uL — ABNORMAL LOW (ref 4.22–5.81)
RDW: 15.6 % — ABNORMAL HIGH (ref 11.5–15.5)
WBC: 7.6 10*3/uL (ref 4.0–10.5)
nRBC: 0 % (ref 0.0–0.2)

## 2021-03-05 LAB — BASIC METABOLIC PANEL
Anion gap: 8 (ref 5–15)
BUN: 17 mg/dL (ref 6–20)
CO2: 29 mmol/L (ref 22–32)
Calcium: 8.8 mg/dL — ABNORMAL LOW (ref 8.9–10.3)
Chloride: 95 mmol/L — ABNORMAL LOW (ref 98–111)
Creatinine, Ser: 1.4 mg/dL — ABNORMAL HIGH (ref 0.61–1.24)
GFR, Estimated: 59 mL/min — ABNORMAL LOW (ref >60)
Glucose, Bld: 105 mg/dL — ABNORMAL HIGH (ref 70–99)
Potassium: 4.4 mmol/L (ref 3.5–5.1)
Sodium: 132 mmol/L — ABNORMAL LOW (ref 135–145)

## 2021-03-05 LAB — GLUCOSE, CAPILLARY
Glucose-Capillary: 103 mg/dL — ABNORMAL HIGH (ref 70–99)
Glucose-Capillary: 98 mg/dL (ref 70–99)
Glucose-Capillary: 93 mg/dL (ref 70–99)
Glucose-Capillary: 97 mg/dL (ref 70–99)

## 2021-03-05 MED ORDER — SODIUM CHLORIDE 0.9 % IV SOLN: INTRAVENOUS | Status: AC

## 2021-03-05 NOTE — Progress Notes (Signed)
Physical Therapy Note  Patient Details  Name: [name] MRN: [id number] Date of Birth: 1965/08/27 Today's Date: 03/05/2021    Attempted to see patient to make up missed session from this AM. Pt had returned from MRI but was lethargic and unable to be aroused to participate in session. Pt's wound vac found to be disconnected and not plugged in, nursing alerted and able to reconnect wound vac and change out canister, wound vac plugged back into wall power. Pt left supine in bed with needs in reach.   Excell Seltzer, PT, DPT, CSRS  03/05/2021, 1:55 PM

## 2021-03-05 NOTE — Progress Notes (Signed)
Occupational Therapy Session Note  Patient Details  Name: [name] MRN: [id number] Date of Birth: 04-09-1966  Today's Date: 03/05/2021 OT Individual Time: 8250-5397 OT Individual Time Calculation (min): 44 min    Short Term Goals: Week 3:  OT Short Term Goal 1 (Week 3): STG=LTG 2/2 ELOS (continue working towards supervision/mod I LTG)  Skilled Therapeutic Interventions/Progress Updates:  Pt greeted supine in bed agreeable to OT intervention. Session focus on BADL reeducation and functional mobliity. Pt completed bed mobility with CGA with heavy use of bed features. CGA for sit<>stand from heavily elevatd EOB with RW and heavy reliance on BUEs. Pt completed stand pivot transfer from EOB>w/c with Rw and CGA. Pt completed UB dressing with MOD A with pt wanting hospital gown donned. Pt completed w/c propulsion to sink with supervision. Pt completed seated grooming tasks with s/u assist including washing face and oral care. Pt completed x3 sit<>stands from w/c with overall CGA with RW, increased effort to transfer BUEs from w/c armrests to RW. Pt completed stand pivot transfer from w/c>EOB with RW and CGA. Pt doing a great job at directing level of care during session. Pt completed sit>supine with MOD A needing assist to elevate BLEs. Pt left in sidelying to L side, all needs within reach and RN present.                             Therapy Documentation Precautions:  Precautions Precautions: Fall, Back, Other (comment) (wound vac) Precaution Booklet Issued: No Precaution Comments: wound vac, pt able to recall back precautions Required Braces or Orthoses: Other Brace Other Brace: no brace needed per order, d/t wound vac Restrictions Weight Bearing Restrictions: No  Pain:pt reprots 8/10 pain in back, rest breaks provided as needed.     Therapy/Group: Individual Therapy  Corinne Ports [facility] 03/05/2021, 10:03 AM

## 2021-03-05 NOTE — Progress Notes (Signed)
[facility] PHYSICAL MEDICINE & REHABILITATION PROGRESS NOTE  Subjective/Complaints:   Pt reports didn't get MRI yesterday- is due today. ID marker's up and ID ordered it- due to abrupt rise in CRP AND esp ESR.  Drinking somewhat more- to try and compensate for rising Cr.  However Na down to 132 from 135 AND Cr up to 1.40- 1 week ago was 1.12  ROS:   Pt denies SOB, abd pain, CP, N/V/C/D, and vision changes    Objective: Vital Signs: Blood pressure (!) 113/59, pulse 94, temperature 98.2 F (36.8 C), resp. rate 15, height '6\' 2"'  (1.88 m), weight 133.6 kg, SpO2 97 %. No results found. Recent Labs    03/04/21 0345 03/05/21 0416  WBC 6.7 7.6  HGB 7.5* 8.5*  HCT 24.5* 27.4*  PLT 220 257     Recent Labs    03/04/21 0345 03/05/21 0416  NA 135 132*  K 4.9 4.4  CL 96* 95*  CO2 29 29  GLUCOSE 117* 105*  BUN 13 17  CREATININE 1.30* 1.40*  CALCIUM 8.5* 8.8*      Intake/Output Summary (Last 24 hours) at 03/05/2021 0849 Last data filed at 03/04/2021 1800 Gross per 24 hour  Intake --  Output 1000 ml  Net -1000 ml        Physical Exam: BP (!) 113/59 (BP Location: Left Arm)   Pulse 94   Temp 98.2 F (36.8 C)   Resp 15   Ht '6\' 2"'  (1.88 m)   Wt 133.6 kg   SpO2 97%   BMI 37.82 kg/m     General: awake, alert, appropriate, NAD HENT: conjugate gaze; oropharynx moist; conjunctivae pale like Hb lower CV: regular rate; no JVD Pulmonary: CTA B/L; no W/R/R- good air movement GI: soft, NT, ND, (+)BS; protuberant Psychiatric: appropriate; flat but interactive Neurological: Ox3  Skin: Warm and dry.  + VAC. On lumbar incision- very long/large MS- very TTP in trigger points that are palpable in L and R lumbar paraspinals- still TTP Neuro: Alert Motor: Bilateral upper extremities: 5/5 proximal distal LE- HF 4/5; KE/KF 4+/5; Df/PF 4+/5, unchanged  Assessment/Plan: 1. Functional deficits which require 3+ hours per day of interdisciplinary therapy in a comprehensive  inpatient rehab setting. Physiatrist is providing close team supervision and 24 hour management of active medical problems listed below. Physiatrist and rehab team continue to assess barriers to discharge/monitor patient progress toward functional and medical goals   Care Tool:  Bathing    Body parts bathed by patient: Right arm, Left arm, Chest, Abdomen, Right upper leg, Left upper leg, Face, Right lower leg, Left lower leg, Front perineal area   Body parts bathed by helper: Buttocks     Bathing assist Assist Level: Minimal Assistance - Patient > 75% (simulated LB with AE)     Upper Body Dressing/Undressing Upper body dressing   What is the patient wearing?: Pull over shirt    Upper body assist Assist Level: Set up assist    Lower Body Dressing/Undressing Lower body dressing      What is the patient wearing?: Pants     Lower body assist Assist for lower body dressing: Minimal Assistance - Patient > 75% (simulated)     Toileting Toileting    Toileting assist Assist for toileting: Independent with assistive device (per most recent staff report)     Transfers Chair/bed transfer  Transfers assist  Chair/bed transfer activity did not occur: Safety/medical concerns  Chair/bed transfer assist level: Supervision/Verbal cueing Chair/bed transfer assistive  device: Museum/gallery exhibitions officer assist   Ambulation activity did not occur: Safety/medical concerns  Assist level: Supervision/Verbal cueing Assistive device: Walker-rolling Max distance: 100'   Walk 10 feet activity   Assist  Walk 10 feet activity did not occur: Safety/medical concerns  Assist level: Supervision/Verbal cueing Assistive device: Walker-rolling   Walk 50 feet activity   Assist Walk 50 feet with 2 turns activity did not occur: Safety/medical concerns  Assist level: Supervision/Verbal cueing Assistive device: Walker-rolling    Walk 150 feet activity   Assist  Walk 150 feet activity did not occur: Safety/medical concerns  Assist level: Contact Guard/Touching assist Assistive device: Walker-rolling    Walk 10 feet on uneven surface  activity   Assist Walk 10 feet on uneven surfaces activity did not occur: Safety/medical concerns         Wheelchair     Assist Is the patient using a wheelchair?: Yes Type of Wheelchair: Manual Wheelchair activity did not occur: Safety/medical concerns  Wheelchair assist level: Dependent - Patient 0% Max wheelchair distance: 150'    Wheelchair 50 feet with 2 turns activity    Assist    Wheelchair 50 feet with 2 turns activity did not occur: Safety/medical concerns   Assist Level: Dependent - Patient 0%   Wheelchair 150 feet activity     Assist  Wheelchair 150 feet activity did not occur: Safety/medical concerns   Assist Level: Dependent - Patient 0%    Medical Problem List and Plan: 1.  Lumbar radiculopathy/myelopathy secondary to nerve root compression- s/p decompression and wash out after bacteremia from klebsiella pneumonia 11/15- wasn't able to be discharged due to H/H issues- VAC cannot be changed outpt right now - no H/H or wound care office will take him. 11/22- cannot d/c pt with rising ID markers, rising Cr and needing MRI- will call Insurance to try and discuss 2.  Impaired mobility: continue Lovenox  Resumed lovenox  Dr Zada Finders to f/u   11/2- will need after d/c for a total of 2 months from original surgery. -antiplatelet therapy: N/A 3. Postoperative pain: Continue Oxycodone 15 mg prn. Scheduled Tizanidine TID.   valium 5 mg nightly for muscle spasms IV Dilaudid for VAC changes  Increased MS Contin to 30 mg BID  lidocaine patches Gabapentin to 800 mg TID Trigger point injections  11/21- explained to pt will not have IV Dilaudid for home VAC changes- will try to do trigger point injections tomorrow. If possible  11/22- cannot do trigger point injections until  MRI done/results back- con't regimen 4. Mood: Team to provide ego support. LCSW to follow for evaluation and support.              -antipsychotic agents: N/A 5. Neuropsych: This patient is capable of making decisions on his own behalf. 6. Skin/Wound Care:  Monitor wound daily. Added vitamins and protein supplement to help promote wound healing.  7. Fluids/Electrolytes/Nutrition: Monitor I/Os 8. Klebsiella bacteremia w/diskitis: On Cefazolin 2 g every 8 hrs with end date 03/11/21 Check weekly CRP/ESR.  10/31 - upon further review of MRI , IR feels Left psoas fluid is edema rather than abscess, no drainage recommended 11/21- ID just changed his IV ABX til 12/1- will con't and recheck labs qmonday- ESR up to 115 and CRP- 10.3- which is why his ABX just got extended. 11/22- MRI ordered by ID is pending.   9. HTN: Monitor BP tid--continue Microzide and cozaar daily.   Vitals:   03/04/21 1924  03/05/21 0505  BP: (!) 108/58 (!) 113/59  Pulse: 100 94  Resp: 20 15  Temp: 98.3 F (36.8 C) 98.2 F (36.8 C)  SpO2: 97% 97%   11/21- HR borderline high- will monitor esp in setting of Hb dropping, likely due to VAC 10. T2DM with hyperglycemia: CM diet. On Metformin 1000 mg bid  CBG (last 3)  Recent Labs    03/04/21 1643 03/04/21 2118 03/05/21 0611  GLUCAP 152* 110* 103*   11/22- BG's more elevated again- ID reasons?- con't regimen for now- monitor for trend.  11. Neurogenic bladder: with urinary retention On flomax. Increased to 0.8 mg nightly   UA negative   11/18- pt cathing self most of the time 13. Neuropathy: Continue Neurontin 600 mg TID.  14. ABLA: monitor for signs of bleeding. Hgb stabilizing to 8 range.   11/18- Hb stable at 8.3- has stopped bleeding.   11/21- Hb down to 7.5 AND is dry- will recheck in AM since VAC changed today to see if needs transfusion  11/22- Hb 8.5- will monitor Cont to monitor 15. 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 -- Follow up  with surgery after discharge.  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  17. Abdominal pain: Much better--dysesthesias?  18. Hypomagnesemia: Resumed Mag Ox.   10/29- Mg 2.1- monitor 19. Hyponatremia  10/24- fluid restriction 1500cc- and stop HCTZ   Sodium 135 on 11/14  11/21- Na 135- but need to stop fluid restriction due to Cr up to 1.3 20. Hypoalbuminemia  Supplement initiated on 10/21 21. Azotemia/ARI  10/31 resolved , off IVF  , cont to monitor weekly   Creatinine 1.03 on 11/14 22. Constipation  11/22- going more regularly-  23. Hypokalemia: Resolved 24. N/V- improved 11/6  KUB (-) except for constipation Will monitor and might need more cleaning out  ?  Improving 25. Nasal/upper airway congestion  11/16- will start Flonase for allergic reaction, possibly-   11/17- doing a little better- con't regimen  Improving 26. Pt is not safe for d/c with dropping Hb. And Kidney function back up to 1.30- will recheck labs in AM  11/22- pt's Na is dropping; Cr up to 1.40 and Infectious disease markers increasing- ID ordered another MRI due to concern.     LOS: 33 days A FACE TO FACE EVALUATION WAS PERFORMED  [name] 03/05/2021, 8:49 AM

## 2021-03-05 NOTE — Progress Notes (Signed)
Patient ID: [name], male   DOB: 1965/06/14, 55 y.o.   MRN: [id number]  SW met with pt wife and informed pt that d/c date was submitted. SW will follow-up once there is more information on pt discharge.  *Attending unable to submit P2P due to appeal already submitted. SW called Appeals line [phone]) to get updates on status of discharge date. Unable to connect with anyone as voice prompt indicated 'we are dealing with an emergency and to call again later.'  Follow-up will be made to get updates.    Loralee Pacas, MSW, [place] Office: [phone] Cell: [phone] Fax: [phone]

## 2021-03-05 NOTE — Progress Notes (Signed)
Attempt made to MRI pt's lumbar spine. Due to the hardware in his back we were unable to achieve diagnostic imaging on the scanner available. A call was made to the Radiologist and he wanted to discontinue due to artifact and try again on another scanner when it becomes available late in the evening.

## 2021-03-05 NOTE — Patient Care Conference (Signed)
Inpatient RehabilitationTeam Conference and Plan of Care Update Date: 03/05/2021   Time: 11:03 AM    Patient Name: [name]      Medical Record Number: [id number]  Date of Birth: Dec 26, 1965 Sex: Male         Room/Bed: 4W16C/4W16C-01 Payor Info: Payor: Theme park manager / Plan: Theme park manager OTHER / Product Type: *No Product type* /    Admit Date/Time:  01/31/2021  3:41 PM  Primary Diagnosis:  Lumbar disc herniation with myelopathy  Hospital Problems: Principal Problem:   Lumbar disc herniation with myelopathy Active Problems:   Wound infection after surgery   Muscle spasms of both lower extremities   Hypoalbuminemia due to protein-calorie malnutrition (HCC)   Hyponatremia   Hypomagnesemia   Acute blood loss anemia   Postoperative pain   Disto-occlusion   Lumbar discitis   Epidural abscess   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 Courtney Heys Social Worker Present: Loralee Pacas, [place] Nurse Present: Dorthula Nettles, RN PT Present: Excell Seltzer, PT OT Present: Lillia Corporal, OT PPS Coordinator present : Gunnar Fusi, SLP     Current Status/Progress Goal Weekly Team Focus  Bowel/Bladder   Pt self cathing  regain ability to void  cath education, remain cont. of bowel   Swallow/Nutrition/ Hydration             ADL's   fluctuates pending pain, can be Set up for bathing/dressing or up to Min A for LB dress/bathe 2/2 back precautions, Supervision transfers  Mod I  standing balance/tolerance, ADL retraining, AE training, global endurance   Mobility   mod A bed mobility, min A sit to stand, gait up to 200 ft Supervision to CGA  mod I overall  d/c planning, endurance, transfers, bed mobility   Communication             Safety/Cognition/ Behavioral Observations            Pain   Pain 8/10  pain <3  assess pain q4 hr and prn    Skin   wound vac in place, change dressing every 3 days or prn  no new breakdown  assess skin q shift and prn     Discharge Planning:  D/c pending due to wound care needs. Family appealed pt discharge date due to no home health agency and requiring wound care education. Pt has wound care clinic appt now 12/1 with [facility]. HHAs declined being able to accept referral due to staffing and insurance. Pt is not amenable to SNF for continued wound care. Pt has support from [place] for IV abx and [facility] for PICC line and weekly labs. IV abx now end on 12/1.   Team Discussion: Cr. 1.4, Na+ decreased. ESR went from 75 to 120-130. MRI today. IV fluids started. Patient completing self-cathing. Nursing educated on wound vac and dressing changes. Supplies given to family. IV antibiotics extended to 03/14/21. MD plans peer to peer review. Still no HH agency. Wound care with wound clinic scheduled for 03/14/21. Patient on target to meet rehab goals: yes, supervision/mod I with ADL's, min assist stand.  *See Care Plan and progress notes for long and short-term goals.   Revisions to Treatment Plan:  Adjusting medications, monitoring labs  Teaching Needs: Family education, medication/pain management, bladder management, skin/wound care, IV antibiotics, transfer training, etc.  Current Barriers to Discharge:  Decreased caregiver support, Medical stability, Home enviroment access/layout, IV antibiotics, Neurogenic bowel and bladder, Wound care, Weight, Weight bearing restrictions, Medication compliance, and Home Health agency.  Possible Resolutions to Barriers: Family education Wound care education Monitor lab work Peer to Peer review     Medical Summary Current Status: Pt's Cr up to 1.40 and BUN elevated; still having pain esp trigger points; cathing himself; ESR really elevated- extended IV ABX to 12/1 and MRI ordered by ID.  Barriers to Discharge: Decreased  family/caregiver support;Home enviroment access/layout;Medical stability;IV antibiotics;Neurogenic Bowel & Bladder  Barriers to Discharge Comments: barriers are no H/H- still has wound VAC- wound  VACappt on 03/14/21- they can only change 2x/week Possible Resolutions to [employer] Focus: barriers- cannot send out right now due to medical issues- will send in peer to peer- for goal of d/c next Monday/Tuesday.   Continued Need for Acute Rehabilitation Level of Care: The patient requires daily medical management by a physician with specialized training in physical medicine and rehabilitation for the following reasons: Direction of a multidisciplinary physical rehabilitation program to maximize functional independence : Yes Medical management of patient stability for increased activity during participation in an intensive rehabilitation regime.: Yes Analysis of laboratory values and/or radiology reports with any subsequent need for medication adjustment and/or medical intervention. : Yes   I attest that I was present, lead the team conference, and concur with the assessment and plan of the team.   Cristi Loron 03/05/2021, 3:08 PM

## 2021-03-05 NOTE — Progress Notes (Signed)
Physical Therapy Note  Patient Details  Name: [name] MRN: [id number] Date of Birth: October 06, 1965 Today's Date: 03/05/2021    Attempted to see patient for scheduled therapy session at 11:30, pt about to be taken off unit for MRI. Will attempt to make up time later this date. Pt missed 30 min of scheduled therapy session for MRI.    Excell Seltzer, PT, DPT, CSRS  03/05/2021, 11:50 AM

## 2021-03-06 ENCOUNTER — Inpatient Hospital Stay (HOSPITAL_COMMUNITY): Payer: 59

## 2021-03-06 LAB — CBC WITH DIFFERENTIAL/PLATELET
Abs Immature Granulocytes: 0.03 10*3/uL (ref 0.00–0.07)
Basophils Absolute: 0 10*3/uL (ref 0.0–0.1)
Basophils Relative: 0 %
Eosinophils Absolute: 0.9 10*3/uL — ABNORMAL HIGH (ref 0.0–0.5)
Eosinophils Relative: 14 %
HCT: 23.8 % — ABNORMAL LOW (ref 39.0–52.0)
Hemoglobin: 7.5 g/dL — ABNORMAL LOW (ref 13.0–17.0)
Immature Granulocytes: 1 %
Lymphocytes Relative: 10 %
Lymphs Abs: 0.7 10*3/uL (ref 0.7–4.0)
MCH: 27.5 pg (ref 26.0–34.0)
MCHC: 31.5 g/dL (ref 30.0–36.0)
MCV: 87.2 fL (ref 80.0–100.0)
Monocytes Absolute: 0.4 10*3/uL (ref 0.1–1.0)
Monocytes Relative: 7 %
Neutro Abs: 4.3 10*3/uL (ref 1.7–7.7)
Neutrophils Relative %: 68 %
Platelets: 227 10*3/uL (ref 150–400)
RBC: 2.73 MIL/uL — ABNORMAL LOW (ref 4.22–5.81)
RDW: 15.1 % (ref 11.5–15.5)
WBC: 6.3 10*3/uL (ref 4.0–10.5)
nRBC: 0 % (ref 0.0–0.2)

## 2021-03-06 LAB — BASIC METABOLIC PANEL
Anion gap: 6 (ref 5–15)
BUN: 17 mg/dL (ref 6–20)
CO2: 30 mmol/L (ref 22–32)
Calcium: 8.6 mg/dL — ABNORMAL LOW (ref 8.9–10.3)
Chloride: 99 mmol/L (ref 98–111)
Creatinine, Ser: 1.18 mg/dL (ref 0.61–1.24)
GFR, Estimated: >60 mL/min (ref >60)
Glucose, Bld: 98 mg/dL (ref 70–99)
Potassium: 4.6 mmol/L (ref 3.5–5.1)
Sodium: 135 mmol/L (ref 135–145)

## 2021-03-06 LAB — GLUCOSE, CAPILLARY
Glucose-Capillary: 80 mg/dL (ref 70–99)
Glucose-Capillary: 106 mg/dL — ABNORMAL HIGH (ref 70–99)
Glucose-Capillary: 114 mg/dL — ABNORMAL HIGH (ref 70–99)

## 2021-03-06 IMAGING — MR MR LUMBAR SPINE WO/W CM
7 of 9 series · 27 of 48 positions shown · IV contrast (gadavist)
Comparison: [DATE].

CLINICAL DATA: Lumbar radiculopathy, cancer or infection suspected

EXAM:
MRI LUMBAR SPINE WITHOUT AND WITH CONTRAST
TECHNIQUE: Multiplanar and multiecho pulse sequences of the lumbar spine were
obtained without and with intravenous contrast.
CONTRAST:  10mL GADAVIST GADOBUTROL 1 MMOL/ML IV SOLN

[Series 2: T2 · sagittal · 4.0mm · 0.55mm/px · 4 of 19 slices shown]
[im 1/19]
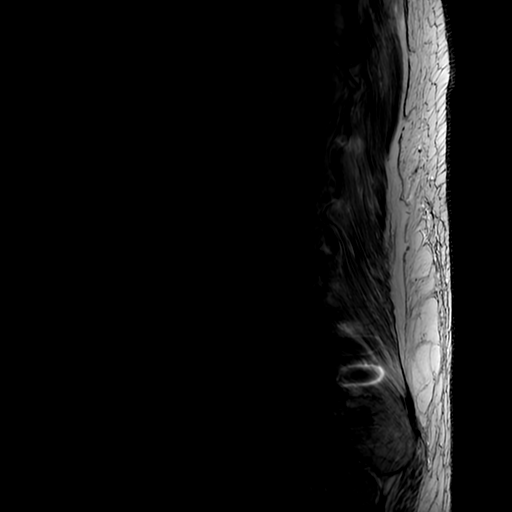
[im 7/19]
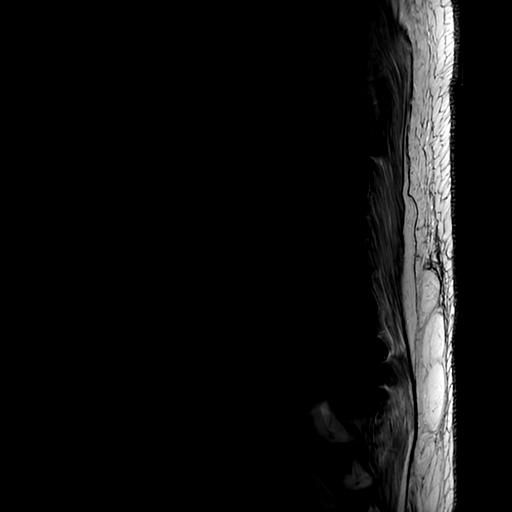
[im 13/19]
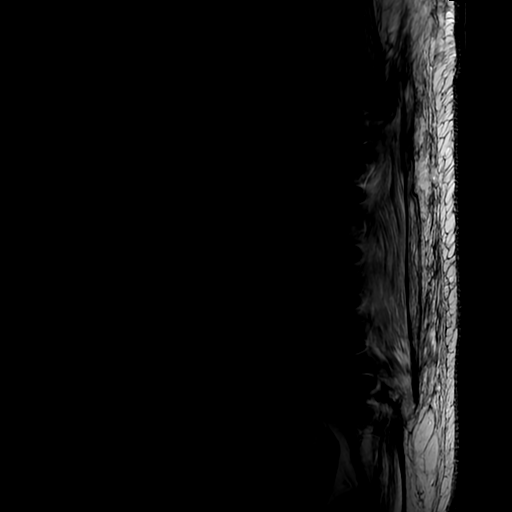
[im 19/19]
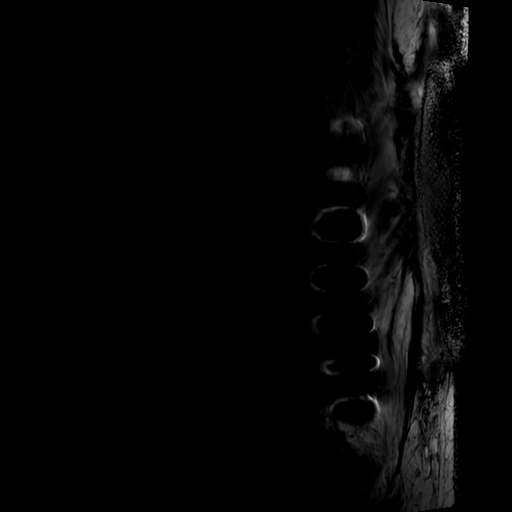

[Series 3: STIR · sagittal · 4.0mm · 0.55mm/px · 5 of 20 slices shown]
[im 1/20]
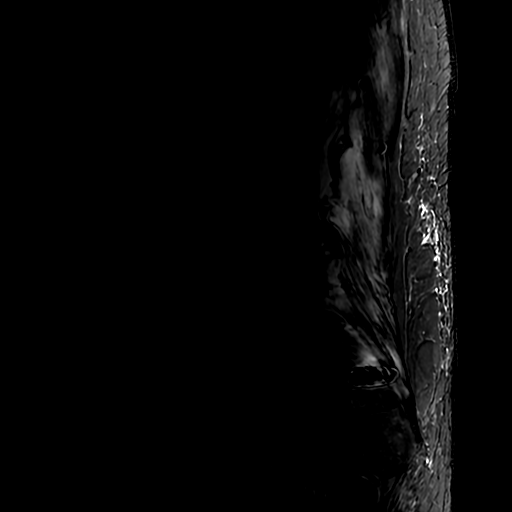
[im 5/20]
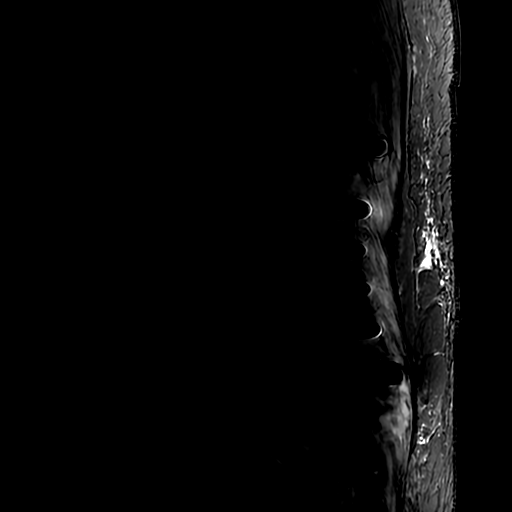
[im 10/20]
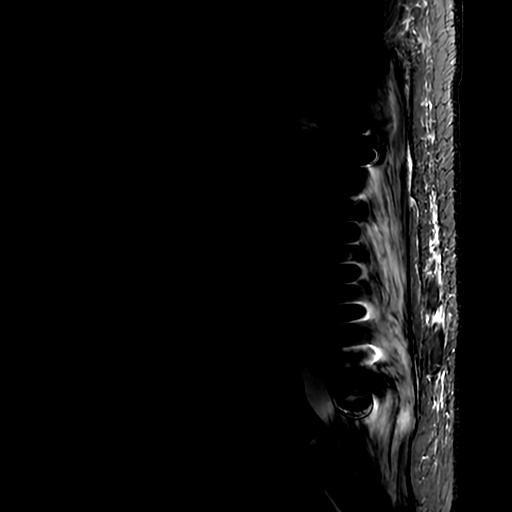
[im 15/20]
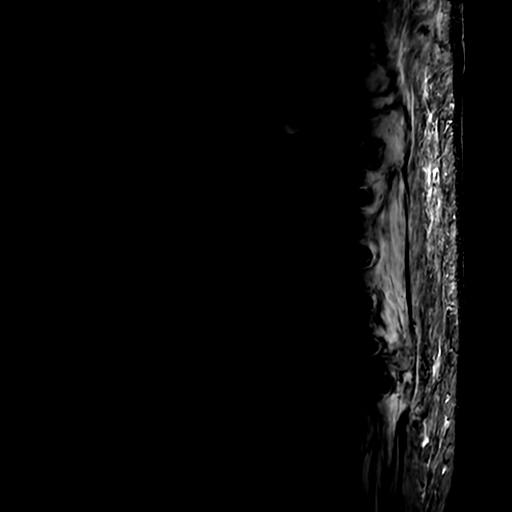
[im 20/20]
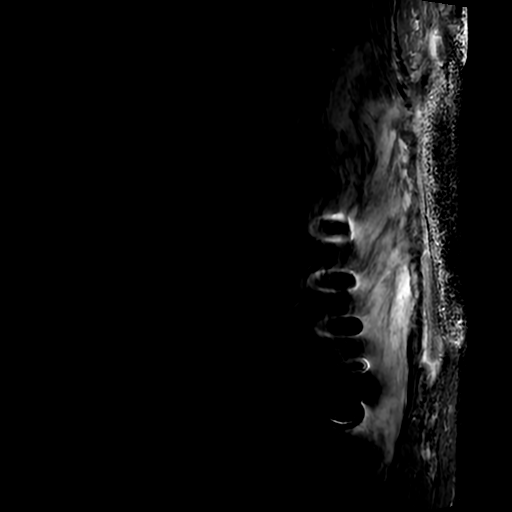

[Series 5: t2_tse_warp_sag · sagittal · 4.0mm · 0.81mm/px · 3 of 19 slices shown]
[im 1/19]
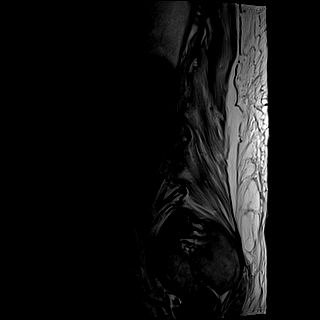
[im 10/19]
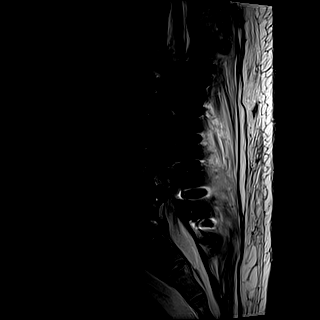
[im 19/19]
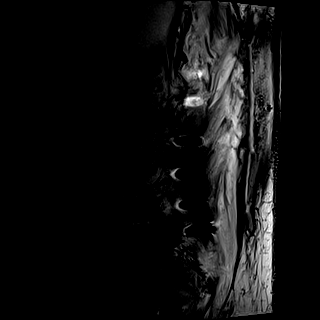

[Series 6: t2_tse_stir_warp_sag · sagittal · 4.0mm · 1.02mm/px · 3 of 19 slices shown]
[im 1/19]
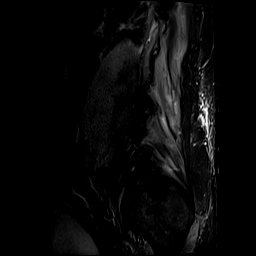
[im 10/19]
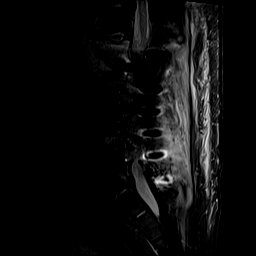
[im 19/19]
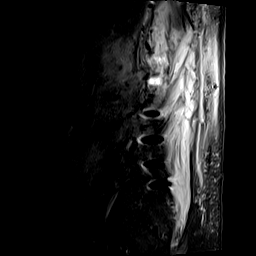

[Series 7: t1_tse_warp_sag · sagittal · 4.0mm · 0.81mm/px · 3 of 19 slices shown]
[im 1/19]
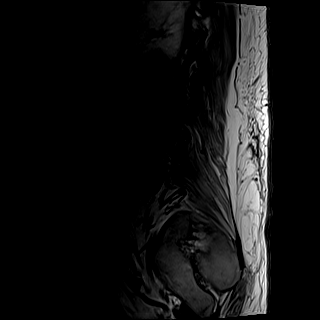
[im 10/19]
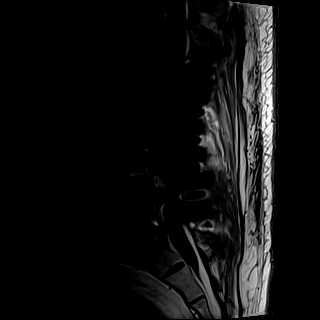
[im 19/19]
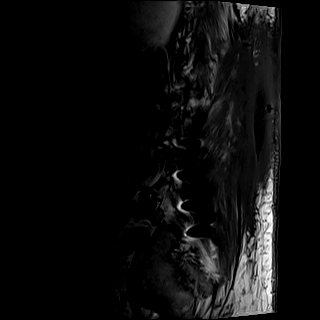

[Series 8: t2_tse_warp_tra · axial · 4.0mm · 0.78mm/px · z∈[-67,+148]mm · 8 of 50 slices shown]
[im 1/50]
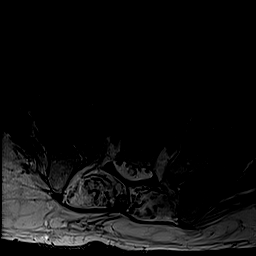
[im 7/50]
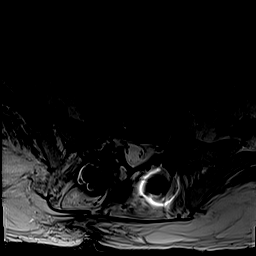
[im 13/50]
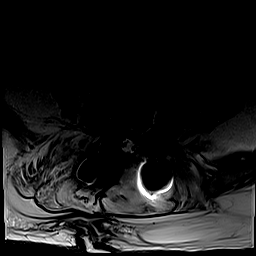
[im 19/50]
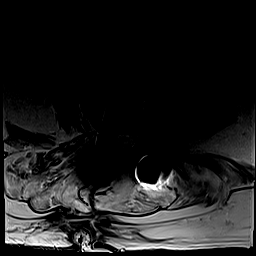
[im 31/50]
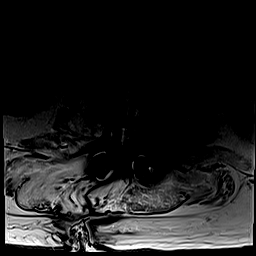
[im 37/50]
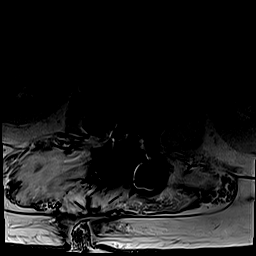
[im 43/50]
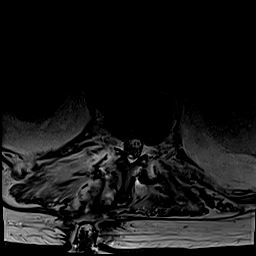
[im 50/50]
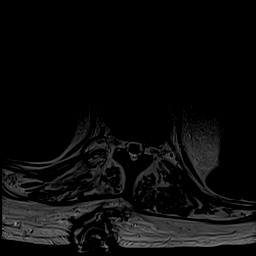

[Series 9: t1_tse_warp_tra · axial · 4.0mm · 0.39mm/px · 1 of 50 slices shown]
[im 1/50]
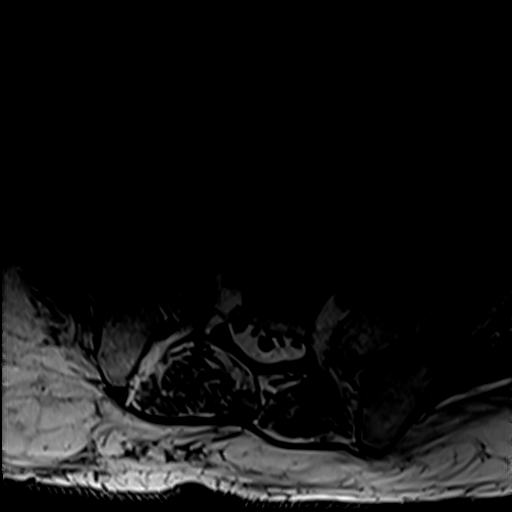

[27 of 48 positions shown; findings below may reference images not displayed]

FINDINGS: Evaluation is significantly limited by susceptibility artifact from
the patient's hardware.

Segmentation: Standard.

Alignment: S shaped curvature. Straightening of the normal lumbar
lordosis. 2-3 mm retrolisthesis L1 on L2, unchanged

Vertebrae: Status post L1-S1 posterior fusion and decompression with
interbody disc spacers. Again noted is abnormal enhancement and
edema at the L1-L2 disc space, consistent with osteomyelitis
discitis, with associated paraspinous edema/phlegmon in the adjacent
left greater than right psoas musculature. Previously noted
superimposed abscess in the left psoas muscle now measures up to
cm (series 8, image 20), previously 0.9 cm when remeasured
similarly. Additional enhancing fluid collection at the anterior
left aspect of the L1-L2 disc space measures approximately 1.6 x
x 3.2 cm (series 8, image 17 and series 10, image 10), likely an
additional focus of abscess and more distinct than on the prior
exam.

Redemonstrated enhancing material within the left greater than right
ventral epidural space from L1-L2 through L2-L3 (series 11, image
23), is also concerning for phlegmon and/or abscess, although
visualization is limited by susceptibility artifact; redemonstrated
mild flattening of the left ventral and lateral thecal sac without
definite high-grade stenosis.

Conus medullaris and cauda equina: Conus extends to the L1 level,
although evaluation is limited by susceptibility. Conus and cauda
equina appear normal; no definite abnormal enhancement.

Paraspinal and other soft tissues: Postoperative changes in the
posterior paraspinous soft tissue, extending from L1 through the
sacrum. Diffuse stranding and enhancement without definite focal
collection; however, the enhancing tissue at the left lateral aspect
of the thecal sac at L2-L[DATE] be contiguous with enhancing soft
tissue posterior to the thecal sac.

Disc levels:

T12-L1: No significant disc bulge. No spinal canal stenosis or
neural foraminal narrowing.

L1-L2: As described above. Prior PLIF. Moderate thecal sac
narrowing, secondary to epidural phlegmon the neural foramina appear
patent, although evaluation is limited by susceptibility artifact.
The interbody disc spacer appears slightly more posterior and to the
right than on the prior exam, although definitive localization is
limited by susceptibility artifact (compare series 8, image 17 from
the current exam with series 13, image 10 from [DATE].

L2-L3: Prior PLIF. Small amount of phlegmon within the left ventral
epidural space. No significant spinal canal stenosis. Evaluation of
the neural foramina is limited by susceptibility artifact. The right
neural foramen appears patent. The left neural foramen is not well
visualized.

L3-L4: Status post PLIF. No spinal canal stenosis. Evaluation of the
neural foramina is limited by susceptibility artifact. The right
neural foramen appears patent. The left neural foramen is not well
visualized.

L4-L5: Status post PLIF. No residual spinal canal stenosis.
Evaluation of the neural foramina is limited by susceptibility
artifact. Mild left greater than right neural foraminal narrowing.

L5-S1: Status post PLIF. No residual spinal canal stenosis. Moderate
right and mild left neural foraminal narrowing.
IMPRESSION: 1. Evaluation is significantly limited by susceptibility artifact
spinal hardware redemonstrated findings consistent with
osteomyelitis discitis neck L1-L2, with epidural phlegmon and/or
abscess within the left ventral epidural space, extending from L1-L2
L2-L3, overall similar to the prior exam.
2. Slight increase in the size of a previously noted left psoas
abscess, with increased distension in conspicuity of a left anterior
paraspinous abscess.
3. Extensive postoperative changes, status post L1-S1 posterior
fusion and decompression, with enhancement and edema but without
discrete fluid collection in the posterior soft tissues.
4. L1-L2 interbody disc spacer appears slightly more posterior and
towards the right aspect of the vertebral body, although evaluation
is limited by susceptibility artifact. Attention on follow-up.

## 2021-03-06 MED ORDER — LORAZEPAM 2 MG/ML IJ SOLN
1.0000 mg | Freq: Once | INTRAMUSCULAR | Status: AC
  Administered 2021-03-06: 1 mg via INTRAVENOUS
  Filled 2021-03-06: qty 1

## 2021-03-06 MED ORDER — LORAZEPAM 2 MG/ML IJ SOLN: 2.0000 mg | Freq: Once | INTRAMUSCULAR | Status: DC

## 2021-03-06 MED ORDER — SIMETHICONE 80 MG PO CHEW
80.0000 mg | CHEWABLE_TABLET | Freq: Four times a day (QID) | ORAL | Status: DC | PRN
  Administered 2021-03-06: 80 mg via ORAL
  Filled 2021-03-06: qty 1

## 2021-03-06 MED ORDER — LORAZEPAM 2 MG/ML IJ SOLN: 1.0000 mg | Freq: Once | INTRAMUSCULAR | Status: DC | PRN

## 2021-03-06 MED ORDER — GADOBUTROL 1 MMOL/ML IV SOLN
10.0000 mL | Freq: Once | INTRAVENOUS | Status: AC | PRN
  Administered 2021-03-06: 10 mL via INTRAVENOUS

## 2021-03-06 NOTE — Progress Notes (Signed)
Occupational Therapy Session Note  Patient Details  Name: [name] MRN: [id number] Date of Birth: 1966/02/15  Today's Date: 03/06/2021 OT Individual Time:  -       Short Term Goals: Week 1:  OT Short Term Goal 1 (Week 1): Pt will increase standing tolerance to 3 mins at sink for grooming/hygiene OT Short Term Goal 1 - Progress (Week 1): Progressing toward goal OT Short Term Goal 2 (Week 1): Pt will increase endurance to complete ADL routine with no more than 1 rest break OT Short Term Goal 2 - Progress (Week 1): Met OT Short Term Goal 3 (Week 1): Pt will bathe wiht S OT Short Term Goal 3 - Progress (Week 1): Met Week 2:  OT Short Term Goal 1 (Week 2): STG=LTG 2/2 ELOS (continue working towards supervision/mod I LTG) Week 3:  OT Short Term Goal 1 (Week 3): STG=LTG 2/2 ELOS (continue working towards supervision/mod I LTG)  Skilled Therapeutic Interventions/Progress Updates:    The pt was at bed level of function upon arrival and indicated that he would prefer to remain in bed secondary to pain response associated with his would vac.  The pt completed sponge bath at bed level with s/u assist for washing his face and UB.  The pt demonstrated ModA for bed mobility with vc's for safe task performance for reaching over head.  The pt  was instructed in AE for long handle sponge for his LE. Patient in bed with bedside table and alarm activated and all additional needs addressed. Patient reported a pain response of 8 on a 0-10 scale associated with wound vac.   Therapy Documentation Precautions:  Precautions Precautions: Fall, Back, Other (comment) (wound vac) Precaution Booklet Issued: No Precaution Comments: wound vac, pt able to recall back precautions Required Braces or Orthoses: Other Brace Other Brace: no brace needed per order, d/t wound vac Restrictions Weight Bearing Restrictions: No General:   Vital Signs:  Pain: Pain Assessment Pain Scale: 0-10 Pain Score: 7  Pain Type:  Surgical pain Pain Location: Back Pain Intervention(s): Medication (See eMAR)  Vision   Perception    Praxis   Exercises:   Other Treatments:     Therapy/Group: Individual Therapy  Yvonne Kendall 03/06/2021, 12:32 PM

## 2021-03-06 NOTE — Consult Note (Signed)
[place] Nurse wound follow up Patient receiving care in [facility] [id number] Plan is to go home at this point. Date unknown. Wife present today to observe dressing change. She will do hands on this Friday.  Wound type: Surgical lumbar  Wound bed: Pink red granulation tissue with minimal yellow fibrinous tissue.  Measurement: 20.5 cm x 6.1 cm x 3.5 depth at the base of the wound. Proximal end of the wound is 4 cm in depth. Undermining from 3 o'clock to 5 o'clock measures 1.7 cm in depth Drainage (amount, consistency, odor) Serosanginous drainage in cannister.  Periwound: Dressing procedure/placement/frequency: 2 pieces of white foam removed. 1 piece Mepitel removed. 1 piece of black foam from the wound and 1 piece of black foam from the bridged area. Skin barrier wipes used on the surrounding skin. 2 pieces of white foam used in the undermining areas at the distal and proximal end of the wound. 1 piece of Mepitel placed over the wound and then covered with 1 piece black foam. Bridged to the upper back with one piece of black foam. Drape applied, immediate suction obtained at 125 mmHg. Patient premedicated and tolerated the procedure well.  Supplies in second drawer.  WOC will follow M/W/F   Jocelyn Lamer L. Tamala Julian, MSN, RN, [place], [place], [facility] Wound Treatment Associate Pager [phone]

## 2021-03-06 NOTE — Progress Notes (Signed)
Physical Therapy Weekly Progress Note  Patient Details  Name: [name] MRN: [id number] Date of Birth: [birth year]  Beginning of progress report period: February 27, 2021 End of progress report period: March 06, 2021  Today's Date: 03/06/2021 PT Individual Time: 1100-1125 PT Individual Time Calculation (min): 25 min   Patient has met 1 of 3 short term goals.  Pt has exhibited improved participation in therapy session during this rehab stay despite ongoing pain, however he continues to require min A for sit to stand transfer from lower surfaces due to pain and has been unable to complete an objective measure for endurance due to time constraint limitations regarding therapy schedule. He is able to ambulate 200 ft (+) with close Supervision to CGA and continues to require min to mod A for bed mobility due to pain, weakness, and body habitus.  Patient continues to demonstrate the following deficits muscle weakness and muscle joint tightness, decreased cardiorespiratoy endurance, and pain  and therefore will continue to benefit from skilled PT intervention to increase functional independence with mobility.  Patient progressing toward long term goals..  Continue plan of care.  PT Short Term Goals Week 1:  PT Short Term Goal 1 (Week 1): Pt will perform sit to stand with Supervision PT Short Term Goal 1 - Progress (Week 1): Progressing toward goal PT Short Term Goal 2 (Week 1): Pt will ambulate x 250 ft with LRAD at Supervision level PT Short Term Goal 2 - Progress (Week 1): Met PT Short Term Goal 3 (Week 1): Pt will perform objective measure for endurance (6MWT) PT Short Term Goal 3 - Progress (Week 1): Not met Week 2:  PT Short Term Goal 1 (Week 2): =LTG due to ELOS PT Short Term Goal 1 - Progress (Week 2): Progressing toward goal  Skilled Therapeutic Interventions/Progress Updates:    Pt received in L sidelying in bed, agreeable to PT session. Pt is lethargic from pain medication but  also reports ongoing pain in R lower back > L lower back. Pt agreeable to participate in session despite pain and lethargy. Sidelying to sitting EOB with min A needed for trunk elevation, HOB elevated and use of bedrail. Pt requires increased time to complete transfer due to lethargy. Assisted pt with applying muscle cream to R lower back and performing TPR for pain management. Assisted pt with changing his shorts while seated EOB. Pt is dependent for clothing management, Supervision for balance with RW in standing. Sit to stand with Supervision to RW from elevated bed this date. Pt agreeable to transfer to recliner to sit up for lunch. Stand pivot transfer bed to recliner with RW and Supervision, assist needed for management of wound vac and IV pole. Pt left seated in recliner in room with needs in reach at end of session.  Therapy Documentation Precautions:  Precautions Precautions: Fall, Back, Other (comment) (wound vac) Precaution Booklet Issued: No Precaution Comments: wound vac, pt able to recall back precautions Required Braces or Orthoses: Other Brace Other Brace: no brace needed per order, d/t wound vac Restrictions Weight Bearing Restrictions: No     Therapy/Group: Individual Therapy   Excell Seltzer, PT, DPT, CSRS 03/06/2021, 12:13 PM

## 2021-03-07 LAB — GLUCOSE, CAPILLARY
Glucose-Capillary: 90 mg/dL (ref 70–99)
Glucose-Capillary: 100 mg/dL — ABNORMAL HIGH (ref 70–99)
Glucose-Capillary: 135 mg/dL — ABNORMAL HIGH (ref 70–99)
Glucose-Capillary: 193 mg/dL — ABNORMAL HIGH (ref 70–99)

## 2021-03-07 NOTE — Progress Notes (Signed)
Occupational Therapy Session Note  Patient Details  Name: [name] MRN: [id number] Date of Birth: Dec 03, 1965  Today's Date: 03/08/2021 OT Individual Time: 3435-6861 OT Individual Time Calculation (min): 38 min    Short Term Goals: Week 1:  OT Short Term Goal 1 (Week 1): Pt will increase standing tolerance to 3 mins at sink for grooming/hygiene OT Short Term Goal 1 - Progress (Week 1): Progressing toward goal OT Short Term Goal 2 (Week 1): Pt will increase endurance to complete ADL routine with no more than 1 rest break OT Short Term Goal 2 - Progress (Week 1): Met OT Short Term Goal 3 (Week 1): Pt will bathe wiht S OT Short Term Goal 3 - Progress (Week 1): Met  Skilled Therapeutic Interventions/Progress Updates:    Pt greeted in bed, awaiting urinary cath and notified RN. OT arrived after pt was finished with his cath and he was then agreeable to tx. Min A for supine<sit and pt then used the RW to ambulate to the toilet at Mod I level. Pt tried to have a BM but unable, reported that he still needs assistance for hygiene during toileting + bathing at this time, able to complete pants up/down unassisted though. He returned to EOB and pt simulated/discussed current ADL levels using AE as needed. Also assisted OT with providing information for d/c writeup. He then returned to bed with Mod A due to 9/10 back pain and wanting to rest. Left him with all needs within reach.   Therapy Documentation Precautions:  Precautions Precautions: Fall, Back Precaution Booklet Issued: No Precaution Comments: wound vac Required Braces or Orthoses: Other Brace Other Brace: no brace needed per order, d/t wound vac Restrictions Weight Bearing Restrictions: No Vital Signs: Therapy Vitals Temp: 98.3 F (36.8 C) Temp Source: Oral Pulse Rate: (!) 104 Resp: 18 BP: 130/76 Patient Position (if appropriate): Lying Oxygen Therapy SpO2: 96 % O2 Device: Room Air Pain: pt reported that he was going to  wait for a bit before notifying RN that he would like some pain medicine   ADL: ADL Eating: Independent Grooming: Modified independent Where Assessed-Grooming: Sitting at sink Upper Body Bathing: Setup Where Assessed-Upper Body Bathing: Sitting at sink Lower Body Bathing: Minimal assistance Where Assessed-Lower Body Bathing: Sitting at sink Upper Body Dressing: Modified independent (Device) Where Assessed-Upper Body Dressing: Sitting at sink Lower Body Dressing: Modified independent Toileting: Minimal assistance Where Assessed-Toileting: Toilet, Bedside Commode Toilet Transfer: Modified independent Armed forces technical officer Method: Ambulating (RW) Science writer: Extra wide bedside commode, Grab bars Tub/Shower Transfer: Not assessed ADL Comments: ADLs at grad day simulated and discussed, pt hooked up to IV and rating back pain as 9/10   Therapy/Group: Individual Therapy  Florrie Ramires A Nariyah Osias 03/08/2021, 3:54 PM

## 2021-03-07 NOTE — Progress Notes (Signed)
[name] PHYSICAL MEDICINE & REHABILITATION PROGRESS NOTE  Subjective/Complaints:   Pt reports having more pain on B/L sides of back- midline pain mainly form VAC.  Pain pretty bad- wants trigger point injections.    ROS:   Pt denies SOB, abd pain, CP, N/V/C/D, and vision changes   Objective: Vital Signs: Blood pressure 138/83, pulse 95, temperature 97.6 F (36.4 C), temperature source Oral, resp. rate 18, height '6\' 2"'  (1.88 m), weight 133.6 kg, SpO2 99 %. MR Lumbar Spine W Wo Contrast  Result Date: 03/06/2021 CLINICAL DATA:  Lumbar radiculopathy, cancer or infection suspected EXAM: MRI LUMBAR SPINE WITHOUT AND WITH CONTRAST TECHNIQUE: Multiplanar and multiecho pulse sequences of the lumbar spine were obtained without and with intravenous contrast. CONTRAST:  43m GADAVIST GADOBUTROL 1 MMOL/ML IV SOLN COMPARISON:  02/09/2021. FINDINGS: Evaluation is significantly limited by susceptibility artifact from the patient's hardware. Segmentation: Standard. Alignment: S shaped curvature. Straightening of the normal lumbar lordosis. 2-3 mm retrolisthesis L1 on L2, unchanged Vertebrae: Status post L1-S1 posterior fusion and decompression with interbody disc spacers. Again noted is abnormal enhancement and edema at the L1-L2 disc space, consistent with osteomyelitis discitis, with associated paraspinous edema/phlegmon in the adjacent left greater than right psoas musculature. Previously noted superimposed abscess in the left psoas muscle now measures up to 1.2 cm (series 8, image 20), previously 0.9 cm when remeasured similarly. Additional enhancing fluid collection at the anterior left aspect of the L1-L2 disc space measures approximately 1.6 x 3.1 x 3.2 cm (series 8, image 17 and series 10, image 10), likely an additional focus of abscess and more distinct than on the prior exam. Redemonstrated enhancing material within the left greater than right ventral epidural space from L1-L2 through L2-L3  (series 11, image 23), is also concerning for phlegmon and/or abscess, although visualization is limited by susceptibility artifact; redemonstrated mild flattening of the left ventral and lateral thecal sac without definite high-grade stenosis. Conus medullaris and cauda equina: Conus extends to the L1 level, although evaluation is limited by susceptibility. Conus and cauda equina appear normal; no definite abnormal enhancement. Paraspinal and other soft tissues: Postoperative changes in the posterior paraspinous soft tissue, extending from L1 through the sacrum. Diffuse stranding and enhancement without definite focal collection; however, the enhancing tissue at the left lateral aspect of the thecal sac at L2-L3 may be contiguous with enhancing soft tissue posterior to the thecal sac. Disc levels: T12-L1: No significant disc bulge. No spinal canal stenosis or neural foraminal narrowing. L1-L2: As described above. Prior PLIF. Moderate thecal sac narrowing, secondary to epidural phlegmon the neural foramina appear patent, although evaluation is limited by susceptibility artifact. The interbody disc spacer appears slightly more posterior and to the right than on the prior exam, although definitive localization is limited by susceptibility artifact (compare series 8, image 17 from the current exam with series 13, image 10 from 02/09/2021. L2-L3: Prior PLIF. Small amount of phlegmon within the left ventral epidural space. No significant spinal canal stenosis. Evaluation of the neural foramina is limited by susceptibility artifact. The right neural foramen appears patent. The left neural foramen is not well visualized. L3-L4: Status post PLIF. No spinal canal stenosis. Evaluation of the neural foramina is limited by susceptibility artifact. The right neural foramen appears patent. The left neural foramen is not well visualized. L4-L5: Status post PLIF. No residual spinal canal stenosis. Evaluation of the neural foramina  is limited by susceptibility artifact. Mild left greater than right neural foraminal narrowing. L5-S1: Status post PLIF.  No residual spinal canal stenosis. Moderate right and mild left neural foraminal narrowing. IMPRESSION: 1. Evaluation is significantly limited by susceptibility artifact spinal hardware redemonstrated findings consistent with osteomyelitis discitis neck L1-L2, with epidural phlegmon and/or abscess within the left ventral epidural space, extending from L1-L2 L2-L3, overall similar to the prior exam. 2. Slight increase in the size of a previously noted left psoas abscess, with increased distension in conspicuity of a left anterior paraspinous abscess. 3. Extensive postoperative changes, status post L1-S1 posterior fusion and decompression, with enhancement and edema but without discrete fluid collection in the posterior soft tissues. 4. L1-L2 interbody disc spacer appears slightly more posterior and towards the right aspect of the vertebral body, although evaluation is limited by susceptibility artifact. Attention on follow-up. Electronically Signed   By: Merilyn Baba M.D.   On: 03/06/2021 19:50   Recent Labs    03/05/21 0416 03/06/21 0431  WBC 7.6 6.3  HGB 8.5* 7.5*  HCT 27.4* 23.8*  PLT 257 227     Recent Labs    03/05/21 0416 03/06/21 0431  NA 132* 135  K 4.4 4.6  CL 95* 99  CO2 29 30  GLUCOSE 105* 98  BUN 17 17  CREATININE 1.40* 1.18  CALCIUM 8.8* 8.6*      Intake/Output Summary (Last 24 hours) at 03/07/2021 7124 Last data filed at 03/07/2021 0221 Gross per 24 hour  Intake --  Output 2700 ml  Net -2700 ml        Physical Exam: BP 138/83   Pulse 95   Temp 97.6 F (36.4 C) (Oral)   Resp 18   Ht '6\' 2"'  (1.88 m)   Wt 133.6 kg   SpO2 99%   BMI 37.82 kg/m      General: awake, alert, appropriate, supine in bed; c/o pain; NAD HENT: conjugate gaze; oropharynx moist CV: regular rate; no JVD Pulmonary: CTA B/L; no W/R/R- good air movement GI: soft,  NT, ND, (+)BS- protuberant Psychiatric: appropriate- flat Neurological: Ox3  Skin: Warm and dry.  + VAC. On lumbar incision- very long/large MS- very TTP in trigger points that are palpable in L and R lumbar paraspinals- still TTP- on B/L low back Neuro: Alert Motor: Bilateral upper extremities: 5/5 proximal distal LE- HF 4/5; KE/KF 4+/5; Df/PF 4+/5, unchanged  Assessment/Plan: 1. Functional deficits which require 3+ hours per day of interdisciplinary therapy in a comprehensive inpatient rehab setting. Physiatrist is providing close team supervision and 24 hour management of active medical problems listed below. Physiatrist and rehab team continue to assess barriers to discharge/monitor patient progress toward functional and medical goals   Care Tool:  Bathing    Body parts bathed by patient: Face   Body parts bathed by helper: Buttocks     Bathing assist Assist Level: Set up assist     Upper Body Dressing/Undressing Upper body dressing   What is the patient wearing?: Hospital gown only    Upper body assist Assist Level: Moderate Assistance - Patient 50 - 74%    Lower Body Dressing/Undressing Lower body dressing      What is the patient wearing?: Pants     Lower body assist Assist for lower body dressing: Minimal Assistance - Patient > 75% (simulated)     Toileting Toileting    Toileting assist Assist for toileting: Independent with assistive device (per most recent staff report)     Transfers Chair/bed transfer  Transfers assist  Chair/bed transfer activity did not occur: Safety/medical concerns  Chair/bed transfer  assist level: Supervision/Verbal cueing Chair/bed transfer assistive device: Programmer, multimedia   Ambulation assist   Ambulation activity did not occur: Safety/medical concerns  Assist level: Supervision/Verbal cueing Assistive device: Walker-rolling Max distance: 100'   Walk 10 feet activity   Assist  Walk 10 feet  activity did not occur: Safety/medical concerns  Assist level: Supervision/Verbal cueing Assistive device: Walker-rolling   Walk 50 feet activity   Assist Walk 50 feet with 2 turns activity did not occur: Safety/medical concerns  Assist level: Supervision/Verbal cueing Assistive device: Walker-rolling    Walk 150 feet activity   Assist Walk 150 feet activity did not occur: Safety/medical concerns  Assist level: Contact Guard/Touching assist Assistive device: Walker-rolling    Walk 10 feet on uneven surface  activity   Assist Walk 10 feet on uneven surfaces activity did not occur: Safety/medical concerns         Wheelchair     Assist Is the patient using a wheelchair?: Yes Type of Wheelchair: Manual Wheelchair activity did not occur: Safety/medical concerns  Wheelchair assist level: Dependent - Patient 0% Max wheelchair distance: 150'    Wheelchair 50 feet with 2 turns activity    Assist    Wheelchair 50 feet with 2 turns activity did not occur: Safety/medical concerns   Assist Level: Dependent - Patient 0%   Wheelchair 150 feet activity     Assist  Wheelchair 150 feet activity did not occur: Safety/medical concerns   Assist Level: Dependent - Patient 0%    Medical Problem List and Plan: 1.  Lumbar radiculopathy/myelopathy secondary to nerve root compression- s/p decompression and wash out after bacteremia from klebsiella pneumonia 11/15- wasn't able to be discharged due to H/H issues- VAC cannot be changed outpt right now - no H/H or wound care office will take him. 11/22- cannot d/c pt with rising ID markers, rising Cr and needing MRI- will call Insurance to try and discuss 11/24- did appeal- is pending- con't PT and OT.  2.  Impaired mobility: continue Lovenox  Resumed lovenox  Dr Zada Finders to f/u   11/2- will need after d/c for a total of 2 months from original surgery. -antiplatelet therapy: N/A 3. Postoperative pain: Continue  Oxycodone 15 mg prn. Scheduled Tizanidine TID.   valium 5 mg nightly for muscle spasms IV Dilaudid for VAC changes  Increased MS Contin to 30 mg BID  lidocaine patches Gabapentin to 800 mg TID Trigger point injections  11/21- explained to pt will not have IV Dilaudid for home VAC changes- will try to do trigger point injections tomorrow. If possible  11/22- cannot do trigger point injections until MRI done/results back- con't regimen  11/24- will do trigger point injections on Right tomorrow, but not left, due to fluid collections 4. Mood: Team to provide ego support. LCSW to follow for evaluation and support.              -antipsychotic agents: N/A 5. Neuropsych: This patient is capable of making decisions on his own behalf. 6. Skin/Wound Care:  Monitor wound daily. Added vitamins and protein supplement to help promote wound healing.  7. Fluids/Electrolytes/Nutrition: Monitor I/Os 8. Klebsiella bacteremia w/diskitis: On Cefazolin 2 g every 8 hrs with end date 03/11/21 Check weekly CRP/ESR.  10/31 - upon further review of MRI , IR feels Left psoas fluid is edema rather than abscess, no drainage recommended 11/21- ID just changed his IV ABX til 12/1- will con't and recheck labs qmonday- ESR up to 115 and CRP-  10.3- which is why his ABX just got extended. 11/22- MRI ordered by ID is pending. 11/24-   MRI L psoas abscess a little bigger.  9. HTN: Monitor BP tid--continue Microzide and cozaar daily.   Vitals:   03/06/21 2121 03/07/21 0555  BP: 130/75 138/83  Pulse: (!) 109 95  Resp: 18 18  Temp: 98.5 F (36.9 C) 97.6 F (36.4 C)  SpO2: 96% 99%   11/21- HR borderline high- will monitor esp in setting of Hb dropping, likely due to VAC 10. T2DM with hyperglycemia: CM diet. On Metformin 1000 mg bid  CBG (last 3)  Recent Labs    03/06/21 1154 03/06/21 2128 03/07/21 0554  GLUCAP 106* 114* 90   11/24- Bgs looking better- con't regimen 11. Neurogenic bladder: with urinary retention On  flomax. Increased to 0.8 mg nightly   UA negative   11/18- pt cathing self most of the time 13. Neuropathy: Continue Neurontin 600 mg TID.  14. ABLA: monitor for signs of bleeding. Hgb stabilizing to 8 range.   11/18- Hb stable at 8.3- has stopped bleeding.   11/21- Hb down to 7.5 AND is dry- will recheck in AM since VAC changed today to see if needs transfusion  11/22- Hb 8.5- will monitor Cont to monitor 15. 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 -- Follow up with surgery after discharge.  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  17. Abdominal pain: Much better--dysesthesias?  18. Hypomagnesemia: Resumed Mag Ox.   10/29- Mg 2.1- monitor 19. Hyponatremia  10/24- fluid restriction 1500cc- and stop HCTZ   Sodium 135 on 11/14  11/21- Na 135- but need to stop fluid restriction due to Cr up to 1.3 20. Hypoalbuminemia  Supplement initiated on 10/21 21. Azotemia/ARI  10/31 resolved , off IVF  , cont to monitor weekly   Creatinine 1.03 on 11/14 22. Constipation  11/22- going more regularly-  23. Hypokalemia: Resolved 24. N/V- improved 11/6  KUB (-) except for constipation Will monitor and might need more cleaning out  ?  Improving 25. Nasal/upper airway congestion  11/16- will start Flonase for allergic reaction, possibly-   11/17- doing a little better- con't regimen  Improving 26. Pt is not safe for d/c with dropping Hb. And Kidney function back up to 1.30- will recheck labs in AM  11/22- pt's Na is dropping; Cr up to 1.40 and Infectious disease markers increasing- ID ordered another MRI due to concern.   11/24- Cr back down to 1.18- although baseline <1.0 and pending ID note on MRI results.     LOS: 35 days A FACE TO FACE EVALUATION WAS PERFORMED  [name] 03/07/2021, 8:23 AM

## 2021-03-08 ENCOUNTER — Other Ambulatory Visit (HOSPITAL_COMMUNITY): Payer: Self-pay

## 2021-03-08 LAB — GLUCOSE, CAPILLARY
Glucose-Capillary: 114 mg/dL — ABNORMAL HIGH (ref 70–99)
Glucose-Capillary: 133 mg/dL — ABNORMAL HIGH (ref 70–99)
Glucose-Capillary: 111 mg/dL — ABNORMAL HIGH (ref 70–99)

## 2021-03-08 MED ORDER — SIMETHICONE 80 MG PO CHEW
80.0000 mg | CHEWABLE_TABLET | Freq: Four times a day (QID) | ORAL | 0 refills | Status: DC | PRN
  Filled 2021-03-08: qty 36, 9d supply, fill #0

## 2021-03-08 MED ORDER — CEFAZOLIN IV (FOR PTA / DISCHARGE USE ONLY): 2.0000 g | Freq: Three times a day (TID) | INTRAVENOUS | 0 refills | Status: DC

## 2021-03-08 MED ORDER — JUVEN PO PACK
1.0000 | PACK | Freq: Two times a day (BID) | ORAL | 0 refills | Status: DC
  Filled 2021-03-08: qty 60, 30d supply, fill #0

## 2021-03-08 MED ORDER — POLYETHYLENE GLYCOL 3350 17 G PO PACK: 17.0000 g | PACK | Freq: Two times a day (BID) | ORAL | 0 refills | Status: AC

## 2021-03-08 MED ORDER — POLYETHYLENE GLYCOL 3350 17 GM/SCOOP PO POWD
17.0000 g | Freq: Two times a day (BID) | ORAL | 0 refills | Status: DC
  Filled 2021-03-08: qty 238, 7d supply, fill #0

## 2021-03-08 MED ORDER — HEPARIN SOD (PORK) LOCK FLUSH 100 UNIT/ML IV SOLN
250.0000 [IU] | INTRAVENOUS | Status: AC | PRN
  Administered 2021-03-08: 250 [IU]
  Filled 2021-03-08: qty 2.5

## 2021-03-08 MED ORDER — FLUTICASONE PROPIONATE 50 MCG/ACT NA SUSP: 1.0000 | Freq: Every day | NASAL | 2 refills | Status: DC

## 2021-03-08 NOTE — Progress Notes (Signed)
Occupational Therapy Discharge Summary  Patient Details  Name: [name] MRN: [id number] Date of Birth: [birth year]  Patient has met 2 of 3 long term goals due to improved activity tolerance, improved balance, postural control, ability to compensate for deficits, and improved coordination.  Patient to discharge at overall Modified Independent level.  Patient's care partner is independent to provide the necessary assistance at discharge.    Pt still requires assistance for pericare during bathing and therefore this goal was unable to be met  Recommendation:  Patient will benefit from ongoing skilled OT services in home health setting to continue to advance functional skills in the area of BADL and iADL.  Equipment: Bariatric [facility]  Reasons for discharge: treatment goals met and discharge from hospital  Patient/family agrees with progress made and goals achieved: Yes  OT Discharge Precautions/Restrictions  Precautions Precautions: Fall;Back Precaution Comments: wound vac Other Brace: no brace needed per order, d/t wound vac Vital Signs Therapy Vitals Temp: 98.3 F (36.8 C) Temp Source: Oral Pulse Rate: (!) 104 Resp: 18 BP: 130/76 Patient Position (if appropriate): Lying Oxygen Therapy SpO2: 96 % O2 Device: Room Air ADL ADL Eating: Independent Grooming: Modified independent Where Assessed-Grooming: Sitting at sink Upper Body Bathing: Setup Where Assessed-Upper Body Bathing: Sitting at sink Lower Body Bathing: Minimal assistance Where Assessed-Lower Body Bathing: Sitting at sink Upper Body Dressing: Modified independent (Device) Where Assessed-Upper Body Dressing: Sitting at sink Lower Body Dressing: Modified independent Toileting: Minimal assistance Where Assessed-Toileting: Toilet, Bedside Commode Toilet Transfer: Modified independent Armed forces technical officer Method: Ambulating (RW) Science writer: Extra wide bedside commode, Grab bars Tub/Shower Transfer: Not  assessed ADL Comments: ADLs at grad day simulated and discussed, pt hooked up to IV and rating back pain as 9/10 Vision Baseline Vision/History: 1 Wears glasses Patient Visual Report: No change from baseline Perception  Perception: Within Functional Limits Praxis Praxis: Intact Cognition Overall Cognitive Status: Within Functional Limits for tasks assessed Arousal/Alertness: Awake/alert Orientation Level: Oriented X4 Year: 2022 Month: November Day of Week: Correct Immediate Memory Recall: Sock;Blue;Bed Memory Recall Sock: Without Cue Memory Recall Blue: Without Cue Memory Recall Bed: Without Cue Safety/Judgment: Appears intact Sensation Sensation Light Touch: Appears Intact Coordination Gross Motor Movements are Fluid and Coordinated: No Fine Motor Movements are Fluid and Coordinated: Yes Motor  Motor Motor - Discharge Observations: affected by pain Trunk/Postural Assessment  Cervical Assessment Cervical Assessment: Exceptions to [facility] (forward head) Thoracic Assessment Thoracic Assessment: Exceptions to [facility] (rounded shoulders) Lumbar Assessment Lumbar Assessment: Exceptions to [facility] (posterior pelvic tilt) Postural Control Postural Control: Within Functional Limits  Balance Balance Balance Assessed: Yes Dynamic Sitting Balance Dynamic Sitting - Balance Support: No upper extremity supported;Feet supported;During functional activity Dynamic Sitting - Level of Assistance: 7: Independent Dynamic Standing Balance Dynamic Standing - Balance Support: During functional activity Dynamic Standing - Level of Assistance: 6: Modified independent (Device/Increase time) Dynamic Standing - Balance Activities: Lateral lean/weight shifting;Forward lean/weight shifting (pulling up pants after attempting to use the restroom) Extremity/Trunk Assessment RUE Assessment RUE Assessment: Within Functional Limits LUE Assessment LUE Assessment: Within Functional Limits   Corrina Steffensen A  Violeta Lecount 03/08/2021, 3:42 PM

## 2021-03-08 NOTE — Progress Notes (Addendum)
Patient ID: [name], male   DOB: May 02, 1965, 55 y.o.   MRN: [id number]  SW received message from 11/23 indicating pt was denied for continued stay and last covered stay is 11/21.   SW updated medical team on changes. SW spoke with pt and pt husband to inform on pt d/c for today and if continued stay will be responsible for hospital stay. Family agreeable to leaving today. SW informed will follow-up once able to coordinate all care needs.   *SW spoke with Pam Chandler/[facility] to update on above today and likely late d/c. Pt will need 2pm dose. Explained that [facility] will be on hold until he has his appt with ID on 11/29 to determine if services are needed. Will get Iv meds sent to house today.  SW updated pt wife on above. Will pick up pt around 5pm.   SW updated medical team on above.   *Updates from PA- Pam that pt may need IV abx through 12/29 per Dr Tommy Medal; which is all pending appt on 11/29 with ID.  Loralee Pacas, MSW, [place] Office: [phone] Cell: [phone] Fax: [phone]

## 2021-03-08 NOTE — Consult Note (Signed)
[place] Nurse wound follow up Patient receiving care in [facility] [id number] Plan is to go home at this point. Date unknown. Wife present today and was able to do the complete dressing change with very little assistance. If patient still here on Monday, she will change again.  Wound type: Surgical lumbar  Wound bed: Pink red granulation tissue with minimal yellow fibrinous tissue.  Drainage (amount, consistency, odor) sanginous drainage in cannister.  Periwound: Dressing procedure/placement/frequency: 2 pieces of white foam removed. 1 piece Mepitel removed. 1 piece of black foam from the wound and 1 piece of black foam from the bridged area. Skin barrier wipes used on the surrounding skin. 2 pieces of white foam used in the undermining areas at the distal and proximal end of the wound. 1 piece of Mepitel placed over the wound and then covered with 1 piece black foam. Bridged to the upper back with one piece of black foam. Drape applied, immediate suction obtained at 125 mmHg. Patient premedicated and tolerated the procedure well.  Supplies in second drawer.  WOC will follow M/W/F   Jocelyn Lamer L. Tamala Julian, MSN, RN, [place], Lysle Pearl, [facility] Wound Treatment Associate Pager [phone]

## 2021-03-08 NOTE — Progress Notes (Signed)
Reached out to Dr. Tommy Medal to review patient's MRI. Updated on patient--he still continues to have copious drainage with delayed slow/minimal progression in healing. He recommended adding 4 additional weeks of antibiotics for now. Patient will be evaluated by Dr. Yvette Rack next week who will give further update on antibiotic regimen.

## 2021-03-08 NOTE — Progress Notes (Signed)
Inpatient Rehabilitation Care Coordinator Discharge Note   Patient Details  Name: MILBERN DOESCHER MRN: [id number] Date of Birth: 24-Feb-1966   Discharge location: D/c tohome with support from wife and brother  Length of Stay: 35 days  Discharge activity level: Supervision  Home/community participation: Limited  Patient response JD:BZMCEY Literacy - How often do you need to have someone help you when you read instructions, pamphlets, or other written material from your doctor or pharmacy?: Never  Patient response EM:VVKPQA Isolation - How often do you feel lonely or isolated from those around you?: Never  Services provided included: MD, RD, PT, OT, RN, CM, TR, Pharmacy, Neuropsych, SW  Financial Services:  Charity fundraiser Utilized: Regulatory affairs officer  Choices offered to/list presented to: Yes  Follow-up services arranged:  Home Health, DME, Outpatient Home Health Agency: [place] to address [facility] for PICC line care and weekly labs. Currently on hold until appt with ID to determine if services are needed.  Outpatient Servicies: [facility] for PT DME : [employer] for wheelchair , bariatric RW and 3in1 BSC; [employer] to deliver 24x18 w/c to your home; [employer] for 35fr catheters; [employer] for wound vac; [facility] for IV abx until ? 12/29- pending ID appt on 11/29    Patient response to transportation need: Is the patient able to respond to transportation needs?: Yes In the past 12 months, has lack of transportation kept you from medical appointments or from getting medications?: No In the past 12 months, has lack of transportation kept you from meetings, work, or from getting things needed for daily living?: No  Comments (or additional information):  Patient/Family verbalized understanding of follow-up arrangements:  Yes  Individual responsible for coordination of the follow-up plan: contact pt [phone] or pt wife [phone]  Confirmed correct DME  delivered: [name] 03/08/2021    [name]

## 2021-03-08 NOTE — Progress Notes (Signed)
Physical Therapy Session Note  Patient Details  Name: [name] MRN: [id number] Date of Birth: July 22, 1965  Today's Date: 03/08/2021 PT Amount of Missed Time (min): 30 Minutes PT Missed Treatment Reason: Patient unwilling to participate;Unavailable (Comment);Pain (nursing care)  Short Term Goals: Week 3:  PT Short Term Goal 1 (Week 3): =LTG due to ELOS  Skilled Therapeutic Interventions/Progress Updates:    Attempted to see patient for scheduled therapy session at 0800, nursing in room to attach IV and deliver pain medication prior to wound vac dressing change at 0830. Returned at [employer] and encouraged pt to participate in short distance ambulation in hospital room or standing therex with RW. Pt reports current pain of 8/10 in his back and does not want to increase pain prior to wound vac change. Pt declines any participation in therapy at this time. Pt missed 30 min of scheduled therapy session for nursing care and for refusal due to pain.  Therapy Documentation Precautions:  Precautions Precautions: Fall, Back, Other (comment) (wound vac) Precaution Booklet Issued: No Precaution Comments: wound vac, pt able to recall back precautions Required Braces or Orthoses: Other Brace Other Brace: no brace needed per order, d/t wound vac Restrictions Weight Bearing Restrictions: No    Therapy/Group: Individual Therapy   Excell Seltzer, PT, DPT, CSRS  03/08/2021, 8:19 AM

## 2021-03-08 NOTE — Progress Notes (Signed)
[place] PHYSICAL MEDICINE & REHABILITATION PROGRESS NOTE  Subjective/Complaints:   Pt asking for trigger points- just heard back - insurance isn't going to pay for Acute rehab- regardless of the fact we cannot find H/H for IV ABX NOR wound VAC changes.   Also don't have wound VAC/care appt til 12/1.   Pt also says starting to feel more weak/tired- thinks due to low blood level.   ROS:   Pt denies SOB, abd pain, CP, N/V/C/D, and vision changes   Objective: Vital Signs: Blood pressure 124/82, pulse 90, temperature 98 F (36.7 C), resp. rate 18, height '6\' 2"'  (1.88 m), weight 133.6 kg, SpO2 97 %. MR Lumbar Spine W Wo Contrast  Result Date: 03/06/2021 CLINICAL DATA:  Lumbar radiculopathy, cancer or infection suspected EXAM: MRI LUMBAR SPINE WITHOUT AND WITH CONTRAST TECHNIQUE: Multiplanar and multiecho pulse sequences of the lumbar spine were obtained without and with intravenous contrast. CONTRAST:  73m GADAVIST GADOBUTROL 1 MMOL/ML IV SOLN COMPARISON:  02/09/2021. FINDINGS: Evaluation is significantly limited by susceptibility artifact from the patient's hardware. Segmentation: Standard. Alignment: S shaped curvature. Straightening of the normal lumbar lordosis. 2-3 mm retrolisthesis L1 on L2, unchanged Vertebrae: Status post L1-S1 posterior fusion and decompression with interbody disc spacers. Again noted is abnormal enhancement and edema at the L1-L2 disc space, consistent with osteomyelitis discitis, with associated paraspinous edema/phlegmon in the adjacent left greater than right psoas musculature. Previously noted superimposed abscess in the left psoas muscle now measures up to 1.2 cm (series 8, image 20), previously 0.9 cm when remeasured similarly. Additional enhancing fluid collection at the anterior left aspect of the L1-L2 disc space measures approximately 1.6 x 3.1 x 3.2 cm (series 8, image 17 and series 10, image 10), likely an additional focus of abscess and more distinct than  on the prior exam. Redemonstrated enhancing material within the left greater than right ventral epidural space from L1-L2 through L2-L3 (series 11, image 23), is also concerning for phlegmon and/or abscess, although visualization is limited by susceptibility artifact; redemonstrated mild flattening of the left ventral and lateral thecal sac without definite high-grade stenosis. Conus medullaris and cauda equina: Conus extends to the L1 level, although evaluation is limited by susceptibility. Conus and cauda equina appear normal; no definite abnormal enhancement. Paraspinal and other soft tissues: Postoperative changes in the posterior paraspinous soft tissue, extending from L1 through the sacrum. Diffuse stranding and enhancement without definite focal collection; however, the enhancing tissue at the left lateral aspect of the thecal sac at L2-L3 may be contiguous with enhancing soft tissue posterior to the thecal sac. Disc levels: T12-L1: No significant disc bulge. No spinal canal stenosis or neural foraminal narrowing. L1-L2: As described above. Prior PLIF. Moderate thecal sac narrowing, secondary to epidural phlegmon the neural foramina appear patent, although evaluation is limited by susceptibility artifact. The interbody disc spacer appears slightly more posterior and to the right than on the prior exam, although definitive localization is limited by susceptibility artifact (compare series 8, image 17 from the current exam with series 13, image 10 from 02/09/2021. L2-L3: Prior PLIF. Small amount of phlegmon within the left ventral epidural space. No significant spinal canal stenosis. Evaluation of the neural foramina is limited by susceptibility artifact. The right neural foramen appears patent. The left neural foramen is not well visualized. L3-L4: Status post PLIF. No spinal canal stenosis. Evaluation of the neural foramina is limited by susceptibility artifact. The right neural foramen appears patent. The  left neural foramen is not well visualized.  L4-L5: Status post PLIF. No residual spinal canal stenosis. Evaluation of the neural foramina is limited by susceptibility artifact. Mild left greater than right neural foraminal narrowing. L5-S1: Status post PLIF. No residual spinal canal stenosis. Moderate right and mild left neural foraminal narrowing. IMPRESSION: 1. Evaluation is significantly limited by susceptibility artifact spinal hardware redemonstrated findings consistent with osteomyelitis discitis neck L1-L2, with epidural phlegmon and/or abscess within the left ventral epidural space, extending from L1-L2 L2-L3, overall similar to the prior exam. 2. Slight increase in the size of a previously noted left psoas abscess, with increased distension in conspicuity of a left anterior paraspinous abscess. 3. Extensive postoperative changes, status post L1-S1 posterior fusion and decompression, with enhancement and edema but without discrete fluid collection in the posterior soft tissues. 4. L1-L2 interbody disc spacer appears slightly more posterior and towards the right aspect of the vertebral body, although evaluation is limited by susceptibility artifact. Attention on follow-up. Electronically Signed   By: Merilyn Baba M.D.   On: 03/06/2021 19:50   Recent Labs    03/06/21 0431  WBC 6.3  HGB 7.5*  HCT 23.8*  PLT 227     Recent Labs    03/06/21 0431  NA 135  K 4.6  CL 99  CO2 30  GLUCOSE 98  BUN 17  CREATININE 1.18  CALCIUM 8.6*      Intake/Output Summary (Last 24 hours) at 03/08/2021 1132 Last data filed at 03/08/2021 1100 Gross per 24 hour  Intake --  Output 4900 ml  Net -4900 ml        Physical Exam: BP 124/82 (BP Location: Left Arm)   Pulse 90   Temp 98 F (36.7 C)   Resp 18   Ht '6\' 2"'  (1.88 m)   Wt 133.6 kg   SpO2 97%   BMI 37.82 kg/m       General: awake, alert, appropriate, sitting up in bed; on IV ABX; NAD HENT: conjugate gaze; oropharynx moist CV:  regular rate; no JVD Pulmonary: CTA B/L; no W/R/R- good air movement GI: soft, NT, ND, (+)BS;; protuberant Psychiatric: appropriate; flat Neurological: Ox3  Skin: Warm and dry.  + VAC. On lumbar incision- very long/large MS- very TTP in trigger points that are palpable in L and R lumbar paraspinals- still TTP- on B/L low back- very painful to touch-  Neuro: Alert Motor: Bilateral upper extremities: 5/5 proximal distal LE- HF 4/5; KE/KF 4+/5; Df/PF 4+/5, unchanged  Assessment/Plan: 1. Functional deficits which require 3+ hours per day of interdisciplinary therapy in a comprehensive inpatient rehab setting. Physiatrist is providing close team supervision and 24 hour management of active medical problems listed below. Physiatrist and rehab team continue to assess barriers to discharge/monitor patient progress toward functional and medical goals   Care Tool:  Bathing    Body parts bathed by patient: Face   Body parts bathed by helper: Buttocks     Bathing assist Assist Level: Set up assist     Upper Body Dressing/Undressing Upper body dressing   What is the patient wearing?: Hospital gown only    Upper body assist Assist Level: Moderate Assistance - Patient 50 - 74%    Lower Body Dressing/Undressing Lower body dressing      What is the patient wearing?: Pants     Lower body assist Assist for lower body dressing: Minimal Assistance - Patient > 75% (simulated)     Toileting Toileting    Toileting assist Assist for toileting: Independent with assistive device (per  most recent staff report)     Transfers Chair/bed transfer  Transfers assist  Chair/bed transfer activity did not occur: Safety/medical concerns  Chair/bed transfer assist level: Supervision/Verbal cueing Chair/bed transfer assistive device: Programmer, multimedia   Ambulation assist   Ambulation activity did not occur: Safety/medical concerns  Assist level: Supervision/Verbal  cueing Assistive device: Walker-rolling Max distance: 100'   Walk 10 feet activity   Assist  Walk 10 feet activity did not occur: Safety/medical concerns  Assist level: Supervision/Verbal cueing Assistive device: Walker-rolling   Walk 50 feet activity   Assist Walk 50 feet with 2 turns activity did not occur: Safety/medical concerns  Assist level: Supervision/Verbal cueing Assistive device: Walker-rolling    Walk 150 feet activity   Assist Walk 150 feet activity did not occur: Safety/medical concerns  Assist level: Contact Guard/Touching assist Assistive device: Walker-rolling    Walk 10 feet on uneven surface  activity   Assist Walk 10 feet on uneven surfaces activity did not occur: Safety/medical concerns         Wheelchair     Assist Is the patient using a wheelchair?: Yes Type of Wheelchair: Manual Wheelchair activity did not occur: Safety/medical concerns  Wheelchair assist level: Dependent - Patient 0% Max wheelchair distance: 150'    Wheelchair 50 feet with 2 turns activity    Assist    Wheelchair 50 feet with 2 turns activity did not occur: Safety/medical concerns   Assist Level: Dependent - Patient 0%   Wheelchair 150 feet activity     Assist  Wheelchair 150 feet activity did not occur: Safety/medical concerns   Assist Level: Dependent - Patient 0%    Medical Problem List and Plan: 1.  Lumbar radiculopathy/myelopathy secondary to nerve root compression- s/p decompression and wash out after bacteremia from klebsiella pneumonia 11/15- wasn't able to be discharged due to H/H issues- VAC cannot be changed outpt right now - no H/H or wound care office will take him. 11/22- cannot d/c pt with rising ID markers, rising Cr and needing MRI- will call Insurance to try and discuss 11/24- did appeal- is pending- con't PT and OT.   11/25- heard back- insurance denied- insurance will not allow a peer to peer, in spite of rising ESR/CRP and   needing MRI by ID- nor that things are worse on imaging-  2.  Impaired mobility: continue Lovenox  Resumed lovenox  Dr Zada Finders to f/u   11/2- will need after d/c for a total of 2 months from original surgery. -antiplatelet therapy: N/A 3. Postoperative pain: Continue Oxycodone 15 mg prn. Scheduled Tizanidine TID.   valium 5 mg nightly for muscle spasms IV Dilaudid for VAC changes  Increased MS Contin to 30 mg BID  lidocaine patches Gabapentin to 800 mg TID Trigger point injections  11/21- explained to pt will not have IV Dilaudid for home VAC changes- will try to do trigger point injections tomorrow. If possible  11/22- cannot do trigger point injections until MRI done/results back- con't regimen  11/24- will do trigger point injections on Right tomorrow, but not left, due to fluid collections  11/25- trigger point injections today if possible- usually difficult to arrange on day of d/c.  4. Mood: Team to provide ego support. LCSW to follow for evaluation and support.              -antipsychotic agents: N/A 5. Neuropsych: This patient is capable of making decisions on his own behalf. 6. Skin/Wound Care:  Monitor wound daily.  Added vitamins and protein supplement to help promote wound healing.  7. Fluids/Electrolytes/Nutrition: Monitor I/Os 8. Klebsiella bacteremia w/diskitis: On Cefazolin 2 g every 8 hrs with end date 03/11/21 Check weekly CRP/ESR.  10/31 - upon further review of MRI , IR feels Left psoas fluid is edema rather than abscess, no drainage recommended 11/21- ID just changed his IV ABX til 12/1- will con't and recheck labs qmonday- ESR up to 115 and CRP- 10.3- which is why his ABX just got extended. 11/22- MRI ordered by ID is pending. 11/24-   MRI L psoas abscess a little bigger.   11/25- pending ID plan- they have appt this upcoming week with pt.  9. HTN: Monitor BP tid--continue Microzide and cozaar daily.   Vitals:   03/07/21 1935 03/08/21 0509  BP: 132/88 124/82   Pulse: 93 90  Resp: 18 18  Temp: 98.5 F (36.9 C) 98 F (36.7 C)  SpO2: 99% 97%   11/21- HR borderline high- will monitor esp in setting of Hb dropping, likely due to VAC 10. T2DM with hyperglycemia: CM diet. On Metformin 1000 mg bid  CBG (last 3)  Recent Labs    03/07/21 1652 03/07/21 2108 03/08/21 0616  GLUCAP 135* 193* 114*   11/24- Bgs looking better- con't regimen 11. Neurogenic bladder: with urinary retention On flomax. Increased to 0.8 mg nightly   UA negative   11/18- pt cathing self most of the time 13. Neuropathy: Continue Neurontin 600 mg TID.  14. ABLA: monitor for signs of bleeding. Hgb stabilizing to 8 range.   11/18- Hb stable at 8.3- has stopped bleeding.   11/21- Hb down to 7.5 AND is dry- will recheck in AM since VAC changed today to see if needs transfusion  11/22- Hb 8.5- will monitor Cont to monitor 15. 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 -- Follow up with surgery after discharge.  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  17. Abdominal pain: Much better--dysesthesias?  18. Hypomagnesemia: Resumed Mag Ox.   10/29- Mg 2.1- monitor 19. Hyponatremia  10/24- fluid restriction 1500cc- and stop HCTZ   Sodium 135 on 11/14  11/21- Na 135- but need to stop fluid restriction due to Cr up to 1.3  11/25- Na 135- borderline low- encouraged pt to not drink 3L/day 20. Hypoalbuminemia  Supplement initiated on 10/21 21. Azotemia/ARI  10/31 resolved , off IVF  , cont to monitor weekly   Creatinine 1.03 on 11/14 22. Constipation  11/22- going more regularly-  23. Hypokalemia: Resolved 24. N/V- improved 11/6  KUB (-) except for constipation Will monitor and might need more cleaning out  ?  Improving 25. Nasal/upper airway congestion  11/16- will start Flonase for allergic reaction, possibly-   11/17- doing a little better- con't regimen  Improving 26. Pt is not safe for d/c with dropping Hb. And Kidney function back up to  1.30- will recheck labs in AM  11/22- pt's Na is dropping; Cr up to 1.40 and Infectious disease markers increasing- ID ordered another MRI due to concern.   11/24- Cr back down to 1.18- although baseline <1.0 and pending ID note on MRI results. 11/25- Hb down to 7.5 again- also- insurance requires pt to d/c today.    I spent a total of 34 minutes on total care >50% coordination of care for d/c today-     LOS: 36 days A FACE TO [place] 03/08/2021, 11:32 AM

## 2021-03-08 NOTE — Discharge Summary (Signed)
Physician Discharge Summary  Patient ID: MABRY TIFT MRN: [id number] DOB/AGE: December 28, 1965 55 y.o.  Admit date: 01/31/2021 Discharge date: 03/08/2021  Discharge Diagnoses:  Principal Problem:   Lumbar disc herniation with myelopathy Active Problems:   Wound infection after surgery   Muscle spasms of both lower extremities   Hypoalbuminemia due to protein-calorie malnutrition (HCC)   Acute blood loss anemia   Postoperative pain   Disto-occlusion   Lumbar discitis   Epidural abscess   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HCC)   Slow transit constipation   Discharged Condition: stable  Significant Diagnostic Studies: DG Abd 1 View  Result Date: 02/10/2021 CLINICAL DATA:  Nausea, vomiting, abdominal pain EXAM: ABDOMEN - 1 VIEW COMPARISON:  01/21/2021 FINDINGS: Examination is technically difficult due to patient's body habitus. As far as seen, there is no dilation of small bowel loops. Gas and stool are present in colon. Stomach is not distended. Laminectomy and fusion is seen in lumbar spine. There is soft tissue density in the right paraspinal region which may be an artifact or prominent psoas. In previous MRI done on 02/09/2021, right psoas was not enlarged. IMPRESSION: Nonspecific bowel gas pattern. Electronically Signed   By: Elmer Picker M.D.   On: 02/10/2021 14:24   MR Lumbar Spine W Wo Contrast  Result Date: 03/06/2021 CLINICAL DATA:  Lumbar radiculopathy, cancer or infection suspected EXAM: MRI LUMBAR SPINE WITHOUT AND WITH CONTRAST TECHNIQUE: Multiplanar and multiecho pulse sequences of the lumbar spine were obtained without and with intravenous contrast. CONTRAST:  71m GADAVIST GADOBUTROL 1 MMOL/ML IV SOLN COMPARISON:  02/09/2021. FINDINGS: Evaluation is significantly limited by susceptibility artifact from the patient's hardware. Segmentation: Standard. Alignment: S shaped curvature. Straightening of the normal lumbar lordosis. 2-3 mm retrolisthesis L1 on L2, unchanged Vertebrae: Status  post L1-S1 posterior fusion and decompression with interbody disc spacers. Again noted is abnormal enhancement and edema at the L1-L2 disc space, consistent with osteomyelitis discitis, with associated paraspinous edema/phlegmon in the adjacent left greater than right psoas musculature. Previously noted superimposed abscess in the left psoas muscle now measures up to 1.2 cm (series 8, image 20), previously 0.9 cm when remeasured similarly. Additional enhancing fluid collection at the anterior left aspect of the L1-L2 disc space measures approximately 1.6 x 3.1 x 3.2 cm (series 8, image 17 and series 10, image 10), likely an additional focus of abscess and more distinct than on the prior exam. Redemonstrated enhancing material within the left greater than right ventral epidural space from L1-L2 through L2-L3 (series 11, image 23), is also concerning for phlegmon and/or abscess, although visualization is limited by susceptibility artifact; redemonstrated mild flattening of the left ventral and lateral thecal sac without definite high-grade stenosis. Conus medullaris and cauda equina: Conus extends to the L1 level, although evaluation is limited by susceptibility. Conus and cauda equina appear normal; no definite abnormal enhancement. Paraspinal and other soft tissues: Postoperative changes in the posterior paraspinous soft tissue, extending from L1 through the sacrum. Diffuse stranding and enhancement without definite focal collection; however, the enhancing tissue at the left lateral aspect of the thecal sac at L2-L3 may be contiguous with enhancing soft tissue posterior to the thecal sac. Disc levels: T12-L1: No significant disc bulge. No spinal canal stenosis or neural foraminal narrowing. L1-L2: As described above. Prior PLIF. Moderate thecal sac narrowing, secondary to epidural phlegmon the neural foramina appear patent, although evaluation is limited by susceptibility artifact. The interbody disc spacer appears  slightly more posterior and to the right than on the prior  exam, although definitive localization is limited by susceptibility artifact (compare series 8, image 17 from the current exam with series 13, image 10 from 02/09/2021. L2-L3: Prior PLIF. Small amount of phlegmon within the left ventral epidural space. No significant spinal canal stenosis. Evaluation of the neural foramina is limited by susceptibility artifact. The right neural foramen appears patent. The left neural foramen is not well visualized. L3-L4: Status post PLIF. No spinal canal stenosis. Evaluation of the neural foramina is limited by susceptibility artifact. The right neural foramen appears patent. The left neural foramen is not well visualized. L4-L5: Status post PLIF. No residual spinal canal stenosis. Evaluation of the neural foramina is limited by susceptibility artifact. Mild left greater than right neural foraminal narrowing. L5-S1: Status post PLIF. No residual spinal canal stenosis. Moderate right and mild left neural foraminal narrowing. IMPRESSION: 1. Evaluation is significantly limited by susceptibility artifact spinal hardware redemonstrated findings consistent with osteomyelitis discitis neck L1-L2, with epidural phlegmon and/or abscess within the left ventral epidural space, extending from L1-L2 L2-L3, overall similar to the prior exam. 2. Slight increase in the size of a previously noted left psoas abscess, with increased distension in conspicuity of a left anterior paraspinous abscess. 3. Extensive postoperative changes, status post L1-S1 posterior fusion and decompression, with enhancement and edema but without discrete fluid collection in the posterior soft tissues. 4. L1-L2 interbody disc spacer appears slightly more posterior and towards the right aspect of the vertebral body, although evaluation is limited by susceptibility artifact. Attention on follow-up. Electronically Signed   By: Merilyn Baba M.D.   On: 03/06/2021 19:50    MR Lumbar Spine W Wo Contrast  Result Date: 02/09/2021 CLINICAL DATA:  Initial evaluation for bacteremia following back surgery, abscess. EXAM: MRI LUMBAR SPINE WITHOUT AND WITH CONTRAST TECHNIQUE: Multiplanar and multiecho pulse sequences of the lumbar spine were obtained without and with intravenous contrast. CONTRAST:  59m GADAVIST GADOBUTROL 1 MMOL/ML IV SOLN COMPARISON:  CT from 01/24/2021. FINDINGS: Segmentation: Standard. Lowest well-formed disc space labeled the L5-S1 level. Alignment: Sigmoid scoliotic curvature with straightening of the normal lumbar lordosis. 2-3 mm retrolisthesis of L1 on L2. Vertebrae: Extensive postoperative changes from prior PLIF at L1 through S1. Hardware better evaluated on prior CT. Abnormal edema and enhancement seen about the L1-2 interspace, consistent with osteomyelitis discitis. Associated paraspinous edema/phlegmon within the adjacent left greater than right psoas musculature. Superimposed approximate 1 cm soft tissue abscess within the left psoas muscle (series 13, image 13). Irregular enhancing material within the left ventral epidural space extending from L1-2 to approximately L2-3 suspicious for an associated epidural phlegmon and/or abscess, difficult to visualize given streak artifact from adjacent hardware (series 13, image 14) mild flattening of the adjacent left ventral and lateral thecal sac without high-grade stenosis. No other convincing epidural or subdural collections. No other loculated or discrete soft tissue collections. Remainder of the visualized bone marrow otherwise grossly within normal limits. No evidence for acute or interval fracture. No discrete or worrisome osseous lesions. Conus medullaris and cauda equina: Conus extends to approximately the L1 level, obscured by adjacent hardware. Conus and cauda equina appear grossly normal. Paraspinal and other soft tissues: Extensive postoperative changes present throughout the posterior paraspinous  soft tissues extending from L1 through the sacrum. Diffuse stranding and enhancement without discrete abscess or collection. Visualized visceral structures unremarkable. Disc levels: T11-12: Seen only on sagittal projection. Disc desiccation with mild disc bulge. Mild right-sided facet spurring. No stenosis. T12-L1: Disc desiccation without significant disc bulge. No stenosis.  L1-2: Prior PLIF. Findings concerning for osteomyelitis discitis as above. Small amount of epidural phlegmon within the ventral left epidural space posterior to the L2 vertebral body (series 13, image 14). Residual moderate spinal stenosis at this level. Left neural foramen appears patent. Possible residual mild to moderate right neural foraminal stenosis. L2-3: Prior PLIF. Small amount of epidural phlegmon within the left ventral epidural space from above. No significant spinal stenosis at this level. Foramina appear grossly patent. L3-4: Prior PLIF. No residual spinal stenosis. Residual mild to moderate left neural foraminal narrowing. Right neural foramen appears patent. L4-5: Prior PLIF. No residual spinal stenosis. Residual mild left greater than right L4 foraminal narrowing. L5-S1: Prior PLIF. No residual spinal stenosis. Moderate right with mild left L5 foraminal narrowing. IMPRESSION: 1. Prior PLIF at L1 through S1. 2. 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 Residual moderate spinal stenosis at the L1-2 level. 3. Associated paraspinous edema/phlegmon within the adjacent left greater than right psoas musculature, with superimposed 1 cm left psoas soft tissue abscess. 4. Extensive postoperative changes throughout the posterior paraspinous soft tissues without discrete abscess or collection. Electronically Signed   By: Jeannine Boga M.D.   On: 02/09/2021 20:30   DG  CHEST PORT 1 VIEW  Result Date: 03/02/2021 CLINICAL DATA:  PICC line placement EXAM: PORTABLE CHEST 1 VIEW COMPARISON:  01/21/2021 interval placement of right FINDINGS: Upper extremity PICC line with distal tip terminating in the region of the proximal SVC. The heart size and mediastinal contours are within normal limits. Low lung volumes. Mild left basilar atelectasis. No pleural effusion or pneumothorax. The visualized skeletal structures are unremarkable. IMPRESSION: 1. PICC line tip terminating in the region of the proximal SVC. 2. Low lung volumes with mild left basilar atelectasis. Electronically Signed   By: Davina Poke D.O.   On: 03/02/2021 13:42   [name] EKG SITE RITE  Result Date: 03/02/2021 If Site Rite image not attached, placement could not be confirmed due to current cardiac rhythm.   Labs:  Basic Metabolic Panel: Recent Labs  Lab 03/04/21 0345 03/05/21 0416 03/06/21 0431  NA 135 132* 135  K 4.9 4.4 4.6  CL 96* 95* 99  CO2 _0 GLUCOSE 117* 105* 98  BUN _1 CREATININE 1.30* 1.40* 1.18  CALCIUM 8.5* 8.8* 8.6*    CBC: Recent Labs  Lab 03/04/21 0345 03/05/21 0416 03/06/21 0431  WBC 6.7 7.6 6.3  NEUTROABS  --  5.0 4.3  HGB 7.5* 8.5* 7.5*  HCT 24.5* 27.4* 23.8*  MCV 90.1 89.8 87.2  PLT 220 257 227    CBG: Recent Labs  Lab 03/07/21 1652 03/07/21 2108 03/08/21 0616 03/08/21 1156 03/08/21 1639  GLUCAP 135* 193* 114* 133* 111*    Brief HPI:   [name] is a 55 y.o. male with history of T2DM, OSA, obesity, DDD cervical and lumbar spine with RLE radiculopathy due to severe canal stenosis from L1/L2 to L4-L5.  She was admitted on 01/12/2019 for L1-S1's laminectomy with PLIF by Dr. Venetia Constable.  Hospital course was significant for issues with hypotension, pain, orthostatic symptoms as well as drainage from back incision.  He required Foley placement due to urinary retention and continued to have limitations in mobility and ADLs.  He was admitted to  [facility] on 01/18/2021 for intensive rehab program.  He was started on doxycycline for wound  prophylaxis due to ongoing issues with wound drainage.  He was also started on IV fluids to help manage hyponatremia with AKI.   He developed low-grade fevers with hypotension and tachycardia on 10/10 due to SIRS and was started on broad Bactrim antibiotics.  He was found to have Kleb pneumoniae bacteremia and was started on IV antibiotics x6 weeks per ID input.  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 Lumbar CT was done due to ongoing copious back drainage and showed fluid collection in soft tissues and musculature posterior to L1/S1 felt to be due to seroma.  He was taken back to the OR for wound washout on 10/14 but bone was improving but he developed urinary retention requiring replacement of Foley.  Wound VAC was kept in place due to ongoing issues with drainage.  He continues to be limited by weakness with decreased posture as well as fatigue affecting overall functional status.  He was cleared to resume intensive rehab program on 01/31/21   Hospital Course: KEIGO WHALLEY was admitted to rehab 01/31/2021 for inpatient therapies to consist of PT, ST and OT at least three hours five days a week. Past admission physiatrist, therapy team and rehab RN have worked together to provide customized collaborative inpatient rehab.  He continues on Lovenox for DVT prophylaxis and will require this for 2 months from original surgery.  Pain control has been an issue and was improving with addition and titration of MS Contin to 30 mg twice daily and titration of oxycodone 15 mg daily for as as needed.  Tizanidine was scheduled 3 times daily and Valium 5 mg and added additionally to help manage muscle spasms.  Foley was discontinued and he was noted to have urinary retention due to neurogenic bladder.  He has been educated on in and out catheterizations to keep bladder volumes below 300 cc.   Bowel program is also been augmented on and off to help manage constipation.  Nutrition supplements as well as vitamins were added to help promote wound healing.  He had ssues with hypotension and orthostatic changes due to drop in H&H to 7.3 and was transfused with 1 units PRBC.  Serial che azotemia has resolved with short course hydration with IV fluids.  Voltaren gel was added to help manage OA right knee.  Serial check of BMET showed drop in sodium to 127 and he was placed on FR. Follow labs showed improvement and FR was discontinued by discharge.  He continued to have issues with copious drainage from back requiring wound VAC.  He had upward trending inflammatory markers as well as increasing pain and back therefore MRI of back was repeated 10/29 showing osteomyelitis from L1-L2 with epidural phlegmon/abscess from L1-L2 as well as edema and phlegmonous changes left within right psoas muscle along with 1 cm psoas muscle abscess.  Neurosurgery was consulted for input and did not feel there was no indication for another surgery and recommended drainage of left psoas abscess by interventional radiology.  Case was reviewed by Dr. Laurence Ferrari who felt that findings in the left psoas appeared to be edema and not abscess therefore there was no indication for aspiration or drain placement.  Dr. Graylon Good recommended considering repeat imaging in 2 weeks to evaluate for further changes.    WOC has been following for wound VAC dressing changes and expressed concerns that it was but not progressing as anticipated.  He has also had blistering due to reaction  from adhesive and tape requiring frequent repositioning and bridging of tubing.  Dr. Venetia Constable was consulted for input and recommended second opinion from plastics.  Wound was evaluated by Dr. Claudia Desanctis and Roetta Sessions who recommended dressing changes with wound VAC prevention.  They did not feel surgical intervention was needed at this time and recommended monitoring  for the 7 to 10 days for input on application of wound matrix depending on improvement.    MRI of lumbar spine was repeated prior to discharge and was significantly limited by hardware artifact but did show some slight increase in previously noted's left psoas abscess with increased distention at left anterior paraspinous abscess.  Films were evaluated by Dr. Tommy Medal recommended additional 4 weeks of antibiotic regiment pending follow-up with Dr. Jayme Cloud next week. Patient has been unable to get a home health to follow for wound care changes.  The closest available appointment was with wound care clinic in [facility] for December 1.  Family expressed concerns about discharge to home and his length of stay was extended with search for alternative options.  Insurance has denied LTAC therefore wife was educated on wound care. He was discharged to home 11/   Rehab course: During patient's stay in rehab weekly team conferences were held to monitor patient's progress, set goals and discuss barriers to discharge. At admission, patient required min assist with ADL tasks and with mobility. He  has had improvement in activity tolerance, balance, postural control as well as ability to compensate for deficits. He is able to complete ADL tasks at modified independent level but continues to require assistance with pericare. He requires supervision for bed mobility, min assist for sit to supine and is independent for transfers.  He is able to ambulate 250 feet and independent level with use of rolling walker.  He is able to climb 4 stairs with contact-guard assist.  Family education was completed with wife.     Discharge disposition: 01-Home or Self Care  Diet: Carb modified.   Special Instructions: Wound VAC changes MWF. 2. Monitor BS ac/hs and follow up with PCP for further adjustment.     Discharge Instructions     [facility] pharmacist to adjust dose for Vancomycin, Aminoglycosides and other  anti-infective therapies as requested by physician.   Complete by: As directed    [facility] to provide Cath Flo 60m   Complete by: As directed    Administer for PICC line occlusion and as ordered by physician for other access device issues.   Ambulatory referral to Occupational Therapy   Complete by: As directed    Eval and treat   Ambulatory referral to Physical Medicine Rehab   Complete by: As directed    Discharge date is 11/09--follow up with be hospital follow up   Ambulatory referral to Physical Therapy   Complete by: As directed    Eval and treat   Anaphylaxis Kit: Provided to treat any anaphylactic reaction to the medication being provided to the patient if First Dose or when requested by physician   Complete by: As directed    Epinephrine 138mml vial / amp: Administer 0.64m77m0.64ml29mubcutaneously once for moderate to severe anaphylaxis, nurse to call physician and pharmacy when reaction occurs and call 911 if needed for immediate care   Diphenhydramine 50mg5mIV vial: Administer 25-50mg 88mM PRN for first dose reaction, rash, itching, mild reaction, nurse to call physician and pharmacy when reaction occurs   Sodium Chloride 0.9% NS 500ml I37m  Administer if needed for hypovolemic blood pressure drop or as ordered by physician after call to physician with anaphylactic reaction   Change dressing on IV access line weekly and PRN   Complete by: As directed    Flush IV access with Sodium Chloride 0.9% and Heparin 10 units/ml or 100 units/ml   Complete by: As directed    Home infusion instructions - [facility]   Complete by: As directed    Instructions: Flush IV access with Sodium Chloride 0.9% and Heparin 10units/ml or 100units/ml   Change dressing on IV access line: Weekly and PRN   Instructions Cath Flo 48m: Administer for PICC Line occlusion and as ordered by physician for other access device   [facility] pharmacist to adjust dose for:  Vancomycin, Aminoglycosides and other anti-infective therapies as requested by physician   Method of administration may be changed at the discretion of home infusion pharmacist based upon assessment of the patient and/or caregiver's ability to self-administer the medication ordered   Complete by: As directed    Outpatient Parenteral Antibiotic Therapy Information Antibiotic: Cefazolin (Ancef) IVPB; Indications for use: bacteremia/diskitis; End Date: 03/11/2021   Complete by: As directed    Antibiotic: Cefazolin (Ancef) IVPB   Indications for use: bacteremia/diskitis   End Date: 03/11/2021      Allergies as of 03/08/2021       Reactions   Cymbalta [duloxetine Hcl] Other (See Comments)   Penicillins Other (See Comments)   REACTION: Blister and Sores in the mouth        Medication List     STOP taking these medications    aspirin 81 MG tablet   gabapentin 600 MG tablet Commonly known as: Neurontin Replaced by: gabapentin 400 MG capsule   losartan-hydrochlorothiazide 100-12.5 MG tablet Commonly known as: HYZAAR   NALTREXONE HCL PO       TAKE these medications    blood glucose meter kit and supplies Use up to four times daily as directed. Dx: E11.9   ceFAZolin  IVPB Commonly known as: ANCEF Inject 2 g into the vein every 8 (eight) hours. Indication:  bacteremia/diskitis First Dose: No Last Day of Therapy:  04/11/21 Labs - Once weekly:  CBC/D and BMP, Labs - Every other week:  ESR and CRP Method of administration: IV Push Method of administration may be changed at the discretion of home infusion pharmacist based upon assessment of the patient and/or caregiver's ability to self-administer the medication ordered. Start taking on: March 14, 2021 What changed:  additional instructions These instructions start on March 14, 2021. If you are unsure what to do until then, ask your doctor or other care provider.   citalopram 20 MG tablet Commonly known as:  CELEXA Take 1 tablet (20 mg total) by mouth daily.   cyclobenzaprine 10 MG tablet Commonly known as: FLEXERIL Take 1 tablet (10 mg total) by mouth 3 (three) times daily as needed for muscle spasms.   diazepam 5 MG tablet--Rx# 30 pills Commonly known as: VALIUM Take 1 tablet (5 mg total) by mouth at bedtime.   enoxaparin 40 MG/0.4ML injection Commonly known as: LOVENOX Inject 0.4 mLs (40 mg total) into the skin daily.   fluticasone 50 MCG/ACT nasal spray Commonly known as: FLONASE Place 1 spray into both nostrils daily. What changed: how much to take   gabapentin 400 MG capsule Commonly known as: NEURONTIN Take 2 capsules (800 mg total) by mouth 3 (three) times daily. Replaces: gabapentin 600 MG tablet  HYDROmorphone 2 MG tablet--Rx # 10 pills Commonly known as: Dilaudid Take 1 tablet (2 mg total) by mouth every Monday, Wednesday, and Friday. Take 30 minutes prior to Wound VAC change Notes to patient: Take 30-45 minutes before back dressing changes.    lidocaine 5 % Commonly known as: LIDODERM Place 2 patches onto the skin daily. At 8 pm and remove at 8 am daily   losartan 100 MG tablet Commonly known as: COZAAR Take 1 tablet (100 mg total) by mouth daily.   metFORMIN 1000 MG tablet Commonly known as: GLUCOPHAGE Take 1 tablet (1,000 mg total) by mouth 2 (two) times daily with a meal.   morphine 15 MG 12 hr tablet--Rx # 60 pills Commonly known as: MS CONTIN Take 2 tablets (30 mg total) by mouth every 12 (twelve) hours.   Muscle Rub 10-15 % Crea Apply 1 application topically 4 (four) times daily -  with meals and at bedtime.   nutrition supplement (JUVEN) Pack Take 1 packet by mouth 2 (two) times daily between meals.   OneTouch Delica Lancets 92J Misc Test blood sugar before meals and at bedtime.  Dx code: E11.9 What changed: additional instructions   OneTouch Verio Flex System w/Device Kit Test blood sugar twice a day.  Dx code: E11.9 Notes to patient: Please  check before meals and at bedtime for now--follow up with PCP for input on medication adjustment.    OneTouch Verio test strip Generic drug: glucose blood Check blood sugars before meals and at bedtime.  twice daily What changed: additional instructions   oxyCODONE 15 MG immediate release tablet--Rx # 27 pills Commonly known as: ROXICODONE Take 1 tablet (15 mg total) by mouth every 8 (eight) hours as needed ((score 7 to 10)). What changed:  medication strength how much to take when to take this reasons to take this   polyethylene glycol 17 g packet Commonly known as: MIRALAX / GLYCOLAX Take 17 g by mouth 2 (two) times daily.   protein supplement Powd Take 6 g by mouth 3 (three) times daily with meals.   rosuvastatin 20 MG tablet Commonly known as: CRESTOR Take 1 tablet (20 mg total) by mouth daily.   senna 8.6 MG Tabs tablet Commonly known as: SENOKOT Take 3 tablets (25.8 mg total) by mouth 2 (two) times daily.   tadalafil 20 MG tablet Commonly known as: CIALIS TAKE 1/2 TO 1 TABLET BY MOUTH DAILY AS NEEDED FOR ERECTILE DYSFUNCTION   tamsulosin 0.4 MG Caps capsule Commonly known as: FLOMAX Take 2 capsules (0.8 mg total) by mouth daily. What changed: how much to take   tiZANidine 4 MG tablet Commonly known as: ZANAFLEX Take 1 tablet (4 mg total) by mouth 3 (three) times daily. What changed:  when to take this reasons to take this   V-R GAS RELIEF 80 MG chewable tablet Generic drug: simethicone Chew 1 tablet (80 mg total) by mouth 4 (four) times daily as needed for flatulence. Notes to patient: Over the counter   vitamin C 500 MG tablet Commonly known as: ASCORBIC ACID Take 1 tablet (500 mg total) by mouth 2 (two) times daily.   Zinc Sulfate 220 (50 Zn) MG Tabs Take 1 tablet (220 mg total) by mouth daily.               Discharge Care Instructions  (From admission, onward)           Start     Ordered   02/15/21 0000  Change dressing on  IV access  line weekly and PRN  (Home infusion instructions - [facility] )        02/15/21 0941            Follow-up Information     Courtney Heys, MD Follow up on 03/04/2021.   Specialty: Physical Medicine and Rehabilitation Why: follow up appointment at 1:40 pm Contact information: [address] [place] [address] [phone]         Judith Part, MD Follow up.   Specialty: Neurosurgery Why: for follow up appointment Contact information: [place] [address] [phone]         Colon Branch, MD. Call.   Specialty: Internal Medicine Why: for  post hospital follow up Contact information: [address] [place] [address] [place] Follow up on 03/18/2021.   Why: Be there at 2:15 for appointment at 2:30 pm with Dr. Lawanda Cousins (one of the urology residents) Contact information: [place]        Rosiland Oz, MD Follow up on 03/12/2021.   Specialty: Infectious Diseases Why: Appointment at 9:30 am/be there at 9:15 am for paperwork Contact information: [address] [place] [address] [phone]         Scheeler, Carola Rhine, PA-C Follow up on 03/19/2021.   Specialty: Plastic Surgery Contact information: [place] [address] [phone]                 Signed: Bary Leriche 03/11/2021, 1:34 AM

## 2021-03-08 NOTE — Progress Notes (Signed)
PHARMACY CONSULT NOTE FOR:  OUTPATIENT  PARENTERAL ANTIBIOTIC THERAPY (OPAT)  Indication: Bacteremia/Discitis Regimen: Cefazolin 2g IV q8h End date: 04/11/21  IV antibiotic discharge orders are pended. To discharging provider:  please sign these orders via discharge navigator,  Select New Orders & click on the button choice - Manage This Unsigned Work.     Thank you for allowing pharmacy to be a part of this patient's care.  Lestine Box, PharmD PGY2 Infectious Diseases Pharmacy Resident   Please check [email or web address] for unit-specific pharmacy phone numbers

## 2021-03-08 NOTE — Progress Notes (Signed)
Physical Therapy Discharge Summary  Patient Details  Name: [name] MRN: [id number] Date of Birth: Nov 17, 1965  Today's Date: 03/08/2021  Patient has met 5 of 7 long term goals due to improved activity tolerance, improved balance, improved postural control, and ability to compensate for deficits.  Patient to discharge at an ambulatory level Modified Independent.   Patient's care partner is independent to provide the necessary physical assistance at discharge.  Reasons goals not met: Pt did not meet bed mobility goal of Supervision as he still requires assist to bring LE back up into bed. He did not meet car transfer goal of Supervision as he requires min A. Pt's family is able to provide this level of assist and he has met all goals adequately for a safe d/c home.  Recommendation:  Patient will benefit from ongoing skilled PT services in home health setting to continue to advance safe functional mobility, address ongoing impairments in endurance, strength, balance, safety, independence with functional mobility, and minimize fall risk.  Equipment: No equipment provided. RW and w/c ordered from previous rehab stay.  Reasons for discharge: treatment goals met and discharge from hospital  Patient/family agrees with progress made and goals achieved: Yes  PT Discharge Precautions/Restrictions Precautions Precautions: Fall;Back Precaution Comments: wound vac Other Brace: no brace needed per order, d/t wound vac Restrictions Weight Bearing Restrictions: No Pain Interference Pain Interference Pain Effect on Sleep: 2. Occasionally Pain Interference with Therapy Activities: 4. Almost constantly Pain Interference with Day-to-Day Activities: 4. Almost constantly Vision/Perception  Vision - History Ability to See in Adequate Light: 0 Adequate Perception Perception: Within Functional Limits Praxis Praxis: Intact  Cognition Overall Cognitive Status: Within Functional Limits for tasks  assessed Arousal/Alertness: Awake/alert Orientation Level: Oriented X4 Year: 2022 Month: November Day of Week: Correct Attention: Focused;Sustained Focused Attention: Appears intact Sustained Attention: Appears intact Memory: Appears intact Immediate Memory Recall: Sock;Blue;Bed Memory Recall Sock: Without Cue Memory Recall Blue: Without Cue Memory Recall Bed: Without Cue Awareness: Appears intact Problem Solving: Appears intact Safety/Judgment: Appears intact Sensation Sensation Light Touch: Appears Intact Proprioception: Appears Intact Coordination Gross Motor Movements are Fluid and Coordinated: No Fine Motor Movements are Fluid and Coordinated: Yes Coordination and Movement Description: impaired 2/2 back pain Motor  Motor Motor: Abnormal postural alignment and control Motor - Skilled Clinical Observations: impaired 2/2 pain Motor - Discharge Observations: affected by pain  Mobility Bed Mobility Bed Mobility: Rolling Right;Rolling Left;Supine to Sit;Sit to Supine Rolling Right: Independent with assistive device Rolling Left: Independent with assistive device Supine to Sit: Supervision/Verbal cueing Sit to Supine: Minimal Assistance - Patient > 75% Transfers Transfers: Sit to Stand;Stand Pivot Transfers;Stand to Sit Sit to Stand: Independent with assistive device Stand to Sit: Independent with assistive device Stand Pivot Transfers: Independent with assistive device Transfer (Assistive device): Rolling walker Locomotion  Gait Ambulation: Yes Gait Assistance: Independent with assistive device Gait Distance (Feet): 250 Feet Assistive device: Rolling walker Gait Gait: Yes Gait Pattern: Impaired Gait Pattern: Decreased step length - right;Decreased step length - left;Right flexed knee in stance;Left flexed knee in stance;Trunk flexed Gait velocity: decreased Stairs / Additional Locomotion Stairs: Yes Stairs Assistance: Contact Guard/Touching assist Stair  Management Technique: One rail Right Number of Stairs: 4 Height of Stairs: 6 Pick up small object from the floor (from standing position) activity did not occur: Safety/medical concerns Product manager Mobility: Yes Wheelchair Assistance: Independent with Camera operator: Both upper extremities Wheelchair Parts Management: Needs assistance Distance: 150  Trunk/Postural Assessment  Cervical Assessment  Cervical Assessment: Exceptions to [facility] (forward head) Thoracic Assessment Thoracic Assessment: Exceptions to [facility] (rounded shoulders) Lumbar Assessment Lumbar Assessment: Exceptions to [facility] (posterior pelvic tilt) Postural Control Postural Control: Within Functional Limits  Balance Balance Balance Assessed: Yes Static Sitting Balance Static Sitting - Balance Support: No upper extremity supported;Feet supported Static Sitting - Level of Assistance: 7: Independent Dynamic Sitting Balance Dynamic Sitting - Balance Support: No upper extremity supported;Feet supported;During functional activity Dynamic Sitting - Level of Assistance: 7: Independent Static Standing Balance Static Standing - Balance Support: Bilateral upper extremity supported;During functional activity Static Standing - Level of Assistance: 6: Modified independent (Device/Increase time) Dynamic Standing Balance Dynamic Standing - Balance Support: Bilateral upper extremity supported;During functional activity Dynamic Standing - Level of Assistance: 6: Modified independent (Device/Increase time) Dynamic Standing - Balance Activities: Lateral lean/weight shifting;Forward lean/weight shifting (pulling up pants after attempting to use the restroom) Extremity Assessment  RUE Assessment RUE Assessment: Within Functional Limits LUE Assessment LUE Assessment: Within Functional Limits RLE Assessment RLE Assessment: Within Functional Limits General Strength Comments: 4/5 grossly LLE  Assessment LLE Assessment: Within Functional Limits General Strength Comments: 4/5 to 5/5 grossly     Excell Seltzer, PT, DPT, CSRS 03/08/2021, 4:14 PM

## 2021-03-08 NOTE — Progress Notes (Signed)
Inpatient Rehabilitation Discharge Medication Review by a Pharmacist  A complete drug regimen review was completed for this patient to identify any potential clinically significant medication issues.  High Risk Drug Classes Is patient taking? Indication by Medication  Antipsychotic No   Anticoagulant Yes Lovenox- imparied mobility- VTE prophylaxis  Antibiotic Yes Cefazolin- lumbar surgery w/ washout after bacteremia  Opioid Yes MS Contin, Dilaudid (prior to [facility] change only), OxyIR- pain control  Antiplatelet No   Hypoglycemics/insulin Yes Metformin- T2DM  Vasoactive Medication Yes Losartan- HTN  Chemotherapy No   Other Yes Celexa- MDD, Valium- spasms/sleep, Lidoderm patch- pain, Crestor- dyslipidemia     Type of Medication Issue Identified Description of Issue Recommendation(s)  Drug Interaction(s) (clinically significant)     Duplicate Therapy     Allergy     No Medication Administration End Date     Incorrect Dose     Additional Drug Therapy Needed     Significant med changes from prior encounter (inform family/care partners about these prior to discharge).    Other       Clinically significant medication issues were identified that warrant physician communication and completion of prescribed/recommended actions by midnight of the next day:  No   Time spent performing this drug regimen review (minutes):  30   [name] BS, PharmD, BCPS Clinical Pharmacist  03/08/2021 1:38 PM

## 2021-03-11 ENCOUNTER — Telehealth: Payer: Self-pay

## 2021-03-11 ENCOUNTER — Other Ambulatory Visit: Payer: Self-pay | Admitting: Physical Medicine and Rehabilitation

## 2021-03-11 MED ORDER — ONDANSETRON HCL 4 MG PO TABS: 4.0000 mg | ORAL_TABLET | Freq: Three times a day (TID) | ORAL | 1 refills | Status: DC | PRN

## 2021-03-11 NOTE — Telephone Encounter (Signed)
Transition Care Management Follow-up Telephone Call Date of discharge and from where: 03/08/2021-[place] How have you been since you were released from the hospital? Patient states-Doing good until this morning when I started having some vomiting. One of my doctors is calling in some medicine for nausea. Any questions or concerns? No  Items Reviewed: Did the pt receive and understand the discharge instructions provided? Yes  Medications obtained and verified? Yes  Other? Yes  Any new allergies since your discharge? No  Dietary orders reviewed? Yes Do you have support at home? Yes   Home Care and Equipment/Supplies: Were home health services ordered? Yes it is mentioned in the discharge summary but patient says they were unable to find anyone from home health to come in so he has a nurse friend who is giving him his meds through his PICC line. If so, what is the name of the agency? N/a  Has the agency set up a time to come to the patient's home? not applicabl with assistancee Were any new equipment or medical supplies ordered?  No What is the name of the medical supply agency? N/a Were you able to get the supplies/equipment? not applicable Do you have any questions related to the use of the equipment or supplies? N/a  Functional Questionnaire: (I = Independent and D = Dependent) ADLs: I with assisatnce  Bathing/Dressing- I  Meal Prep- D  Eating- I  Maintaining continence- I  Transferring/Ambulation- I with walker  Managing Meds- I with assistance  Follow up appointments reviewed:  PCP Hospital f/u appt confirmed? No  Patient declined to make an appt today. States his wife will call back to schedule. [facility] f/u appt confirmed? Yes  Scheduled to see Laurier Nancy on 03/12/2021 @ 10:45. Are transportation arrangements needed? No  If their condition worsens, is the pt aware to call PCP or go to the Emergency Dept.? Yes Was the patient provided with contact  information for the PCP's office or ED? Yes Was to pt encouraged to call back with questions or concerns? Yes

## 2021-03-12 ENCOUNTER — Encounter: Payer: Self-pay | Admitting: Infectious Diseases

## 2021-03-12 ENCOUNTER — Telehealth: Payer: Self-pay

## 2021-03-12 ENCOUNTER — Ambulatory Visit (INDEPENDENT_AMBULATORY_CARE_PROVIDER_SITE_OTHER): Payer: 59 | Admitting: Infectious Diseases

## 2021-03-12 ENCOUNTER — Other Ambulatory Visit: Payer: Self-pay

## 2021-03-12 VITALS — BP 129/86 | HR 108 | Temp 97.8°F

## 2021-03-12 DIAGNOSIS — T847XXD Infection and inflammatory reaction due to other internal orthopedic prosthetic devices, implants and grafts, subsequent encounter: Secondary | ICD-10-CM | POA: Diagnosis not present

## 2021-03-12 DIAGNOSIS — Z5181 Encounter for therapeutic drug level monitoring: Secondary | ICD-10-CM

## 2021-03-12 DIAGNOSIS — G062 Extradural and subdural abscess, unspecified: Secondary | ICD-10-CM | POA: Diagnosis not present

## 2021-03-12 NOTE — Progress Notes (Signed)
[facility]                                                             [place], [place], [place], [address]                                                                  Phn. [phone]; Fax: [phone]                                                                             Date: 03/12/21  Reason for Referral: Hospital FU for Lumbar wound infection  Assessment Klebsiella Deep lumbar wound infection complicated with hardware L1-L2 Discitis and osteomyelitis /L1-L2 epidural abscess and left psoas abscess and left anterior paraspinous abscess Medication Monitoring   Plan Continue IV cefazolin until 12/29 as planned during hospitalization Will fu around end date in 12/29 and have a repeat MRI L spine prior to that appt for follow up on previously seen abscesses/phlegmon OPAT orders below Fu with Neurosurgery as instructed   OPAT Diagnosis: Osteomyelitis and discitis   Culture Result: Klebsiella pneumoniae  Allergies  Allergen Reactions   Cymbalta [Duloxetine Hcl] Other (See Comments)   Penicillins Other (See Comments)    REACTION: Blister and Sores in the mouth    OPAT Orders Discharge antibiotics to be given via PICC line Discharge antibiotics: IV cefazolin  Per pharmacy protocol  Aim for Vancomycin trough 15-20 or AUC 400-550 (unless otherwise indicated) Duration: 4 weeks  End Date: 04/11/21  [facility] Care Per Protocol:  Home health RN for IV administration and teaching; PICC line care and labs.    Labs weekly while on IV antibiotics: X__ CBC with differential X__ BMP __ CMP X__ CRP X__ ESR __ Vancomycin trough __ CK  __ Please pull PIC at completion of IV antibiotics X__ Please leave PIC in place until doctor has seen patient or been notified  Fax weekly labs to [phone]  Clinic Follow Up Appt: 12/29  '@10' :30 am    All questions and concerns  were discussed and addressed. Patient verbalized understanding of the plan. ____________________________________________________________________________________________________________________  HPI/Interval events 03/12/21 Here for HFU for deep lumbar wound infection. After last seen by me on  10/17, he had uptrending inflammatory markers as well as increasing back pain for which MRI was repeated 10/29 osteomyelitis discitis at L1-2 with associated small amount of epidural phlegmon and/or abscess within the left ventral epidural space extending from L1-2 to approximately L2-3/Associated paraspinous edema/phlegmon within the adjacent left greater than right psoas musculature, with superimposed 1 cm left psoas soft tissue abscess.  Neurosurgery did not feel another surgery was indicated and recommended IR consult for drainage of left psoas abscess. IR felt the findings in the left psoas was more  of oedema than an abscess and was not drained. ID recommended repeat MRI in 2 weeks. Repeat MRI 11/23 with overall similar findings, left psoas abscess ( 1.2cm , previously 0.9cm), Additional enhancing fluid collection at the anterior left aspect of the L1-L2 disc space measures approximately 1.6 x 3.1 x 3.2 cm likely an additional focus of abscess. Small amount of phlegmon within the left ventral epidural space L2-L3. MRI findings discussed with Dr Tommy Medal and recommended IV abtx for 4 weeks with ID follow up. Patient discharged from the hospital 03/08/21.   Accompanied by his brother. He is in a wheel chair. Back pain is 12/10, taking oxycodone and morphine twice a day. Nausea+. Denies vomiting, abdominal pain and diarrhea. No weakness, numbness and urinary or stool incontinence as well. Getting IV cefazolin through PICC line without any issues. Discussed to follow up  around end date of IV cefazolin with a repeat MRI.   ROS: Constitutional: Negative for fever, chills, activity appetite change, fatigue and  unexpected weight change.  Respiratory: Negative for cough, shortness of breath Cardiovascular: Negative for chest pain, palpitations and leg swelling.  Gastrointestinal: Negative for nausea, vomiting, abdominal pain, diarrhea/constipation, .  Genitourinary: Negative for dysuria, hematuria, flank pain Musculoskeletal: Negative for myalgias, arthralgia, joint swelling, arthralgias, Back pain + Skin: Negative for rashes, lesions  Neurological: Negative for weakness, dizziness or headache  Past Medical History:  Diagnosis Date   DDD (degenerative disc disease), lumbar    Diabetes mellitus    Hyperlipemia    Hypertension    Obesity    OSA (obstructive sleep apnea)    on CPAP   Past Surgical History:  Procedure Laterality Date   APPLICATION OF ROBOTIC ASSISTANCE FOR SPINAL PROCEDURE N/A 01/11/2021   Procedure: APPLICATION OF ROBOTIC ASSISTANCE FOR SPINAL PROCEDURE;  Surgeon: Judith Part, MD;  Location: [facility];  Service: Neurosurgery;  Laterality: N/A;   APPLICATION OF WOUND VAC N/A 01/25/2021   Procedure: APPLICATION OF WOUND VAC;  Surgeon: Dawley, Theodoro Doing, DO;  Location: [facility];  Service: Neurosurgery;  Laterality: N/A;   BACK SURGERY     COLONOSCOPY  2019   LUMBAR WOUND DEBRIDEMENT N/A 01/25/2021   Procedure: Irrigation and Debridement of lumbar wound, and wound vacuum assisted closure;  Surgeon: Dawley, Theodoro Doing, DO;  Location: [facility];  Service: Neurosurgery;  Laterality: N/A;   TRANSFORAMINAL LUMBAR INTERBODY FUSION (TLIF) WITH PEDICLE SCREW FIXATION 4 LEVEL N/A 01/11/2021   Procedure: Lumbar one-two, Lumbar two-three, Lumbar three-four, Lumbar four-five, Lumbar five Sacral one Open decompression, Transforaminal lumbar interbody fusion, posterolateral instrumented fusion;  Surgeon: Judith Part, MD;  Location: [facility];  Service: Neurosurgery;  Laterality: N/A;   Current Outpatient Medications on File Prior to Visit  Medication Sig Dispense Refill   ascorbic acid (VITAMIN C) 500  MG tablet Take 1 tablet (500 mg total) by mouth 2 (two) times daily. 100 tablet 0   blood glucose meter kit and supplies Use up to four times daily as directed. Dx: E11.9 1 each 0   Blood Glucose Monitoring Suppl ([place]) w/Device KIT Test blood sugar twice a day.  Dx code: E11.9 1 kit 0   [START ON 03/14/2021] ceFAZolin (ANCEF) IVPB Inject 2 g into the vein every 8 (eight) hours. Indication:  bacteremia/diskitis First Dose: No Last Day of Therapy:  04/11/21 Labs - Once weekly:  CBC/D and BMP, Labs - Every other week:  ESR and CRP Method of administration: IV Push Method of administration may be  changed at the discretion of home infusion pharmacist based upon assessment of the patient and/or caregiver's ability to self-administer the medication ordered. 90 Units 0   citalopram (CELEXA) 20 MG tablet Take 1 tablet (20 mg total) by mouth daily. 30 tablet 1   cyclobenzaprine (FLEXERIL) 10 MG tablet Take 1 tablet (10 mg total) by mouth 3 (three) times daily as needed for muscle spasms. 90 tablet 0   diazepam (VALIUM) 5 MG tablet Take 1 tablet (5 mg total) by mouth at bedtime. 30 tablet 0   enoxaparin (LOVENOX) 40 MG/0.4ML injection Inject 0.4 mLs (40 mg total) into the skin daily. 14.8 mL 0   fluticasone (FLONASE) 50 MCG/ACT nasal spray Place 1 spray into both nostrils daily.  2   gabapentin (NEURONTIN) 400 MG capsule Take 2 capsules (800 mg total) by mouth 3 (three) times daily. 180 capsule 1   glucose blood (ONETOUCH VERIO) test strip Check blood sugars before meals and at bedtime.  twice daily 200 strip 12   HYDROmorphone (DILAUDID) 2 MG tablet Take 1 tablet (2 mg total) by mouth every Monday, Wednesday, and Friday. Take 30 minutes prior to Wound VAC change 10 tablet 0   lidocaine (LIDODERM) 5 % Place 2 patches onto the skin daily. At 8 pm and remove at 8 am daily 60 patch 0   losartan (COZAAR) 100 MG tablet Take 1 tablet (100 mg total) by mouth daily. 30 tablet 1    Menthol-Methyl Salicylate (MUSCLE RUB) 10-15 % CREA Apply 1 application topically 4 (four) times daily -  with meals and at bedtime. 85 g 0   metFORMIN (GLUCOPHAGE) 1000 MG tablet Take 1 tablet (1,000 mg total) by mouth 2 (two) times daily with a meal. 180 tablet 1   morphine (MS CONTIN) 15 MG 12 hr tablet Take 2 tablets (30 mg total) by mouth every 12 (twelve) hours. 60 tablet 0   nutrition supplement, JUVEN, (JUVEN) PACK Take 1 packet by mouth 2 (two) times daily between meals. 60 packet 0   ondansetron (ZOFRAN) 4 MG tablet Take 1 tablet (4 mg total) by mouth every 8 (eight) hours as needed for nausea or vomiting. Can cause constipation- monitor- 90 tablet 1   OneTouch Delica Lancets 47S MISC Test blood sugar before meals and at bedtime.  Dx code: E11.9 200 each 12   oxyCODONE (ROXICODONE) 15 MG immediate release tablet Take 1 tablet (15 mg total) by mouth every 8 (eight) hours as needed ((score 7 to 10)). 27 tablet 0   polyethylene glycol (MIRALAX / GLYCOLAX) 17 g packet Take 17 g by mouth 2 (two) times daily. 14 each 0   protein supplement (RESOURCE BENEPROTEIN) POWD Take 6 g by mouth 3 (three) times daily with meals. 227 g 0   senna (SENOKOT) 8.6 MG TABS tablet Take 3 tablets (25.8 mg total) by mouth 2 (two) times daily. 270 tablet 0   simethicone (MYLICON) 80 MG chewable tablet Chew 1 tablet (80 mg total) by mouth 4 (four) times daily as needed for flatulence. 60 tablet 0   tadalafil (CIALIS) 20 MG tablet TAKE 1/2 TO 1 TABLET BY MOUTH DAILY AS NEEDED FOR ERECTILE DYSFUNCTION 30 tablet 3   tamsulosin (FLOMAX) 0.4 MG CAPS capsule Take 2 capsules (0.8 mg total) by mouth daily. 60 capsule 1   tiZANidine (ZANAFLEX) 4 MG tablet Take 1 tablet (4 mg total) by mouth 3 (three) times daily. 90 tablet 1   Zinc Sulfate 220 (50 Zn) MG TABS Take 1 tablet (  220 mg total) by mouth daily. 100 tablet 1   rosuvastatin (CRESTOR) 20 MG tablet Take 1 tablet (20 mg total) by mouth daily. 90 tablet 3   No current  facility-administered medications on file prior to visit.   Allergies  Allergen Reactions   Cymbalta [Duloxetine Hcl] Other (See Comments)   Penicillins Other (See Comments)    REACTION: Blister and Sores in the mouth   Social History   Socioeconomic History   Marital status: Married    Spouse name: [name]   Number of children: 2   Years of education: Not on file   Highest education level: Not on file  Occupational History   Occupation: Therapist, art: [employer]   Occupation: Music therapist: [employer]  Tobacco Use   Smoking status: Never   Smokeless tobacco: Never  Scientific laboratory technician Use: Never used  Substance and Sexual Activity   Alcohol use: No   Drug use: No   Sexual activity: Yes  Other Topics Concern   Not on file  Social History Narrative   Remarried for the 3rd time w/ a lady for the [place]   Children:  2010 , 2012   Left handed                Social Determinants of Health   Financial Resource Strain: Not on file  Food Insecurity: Not on file  Transportation Needs: Not on file  Physical Activity: Not on file  Stress: Not on file  Social Connections: Not on file  Intimate Partner Violence: Not on file   Family History  Problem Relation Age of Onset   Diabetes Mother    Hypertension Neg Hx    Coronary artery disease Neg Hx    Colon cancer Neg Hx    Prostate cancer Neg Hx      Vitals BP 129/86 (BP Location: Left Arm)   Pulse (!) 108   Temp 97.8 F (36.6 C) (Oral)    Examination  General - not in acute distress, sitting in wheelchair, appears to have some back pain , Obese  HEENT - no pallor and no icterus Chest - b/l clear air entry, no additional sounds CVS- Normal s1s2, RRR Abdomen - Soft, Non tender , non distended Ext- no pedal edema  Back    Neuro: grossly normal Psych : calm and cooperative   Recent labs CBC Latest Ref Rng & Units 03/06/2021 03/05/2021 03/04/2021  WBC 4.0 - 10.5  K/uL 6.3 7.6 6.7  Hemoglobin 13.0 - 17.0 g/dL 7.5(L) 8.5(L) 7.5(L)  Hematocrit 39.0 - 52.0 % 23.8(L) 27.4(L) 24.5(L)  Platelets 150 - 400 K/uL 227 257 220   CMP Latest Ref Rng & Units 03/06/2021 03/05/2021 03/04/2021  Glucose 70 - 99 mg/dL 98 105(H) 117(H)  BUN 6 - 20 mg/dL '17 17 13  ' Creatinine 0.61 - 1.24 mg/dL 1.18 1.40(H) 1.30(H)  Sodium 135 - 145 mmol/L 135 132(L) 135  Potassium 3.5 - 5.1 mmol/L 4.6 4.4 4.9  Chloride 98 - 111 mmol/L 99 95(L) 96(L)  CO2 22 - 32 mmol/L '30 29 29  ' Calcium 8.9 - 10.3 mg/dL 8.6(L) 8.8(L) 8.5(L)  Total Protein 6.5 - 8.1 g/dL - - -  Total Bilirubin 0.3 - 1.2 mg/dL - - -  Alkaline Phos 38 - 126 U/L - - -  AST 15 - 41 U/L - - -  ALT 0 - 44 U/L - - -   Pertinent Microbiology Results  for orders placed or performed during the hospital encounter of 01/31/21  Urine Culture     Status: None   Collection Time: 02/04/21  8:48 PM   Specimen: Urine, Catheterized  Result Value Ref Range Status   Specimen Description URINE, CATHETERIZED  Final   Special Requests NONE  Final   Culture   Final    NO GROWTH Performed at [facility], [address] [place], [place] [address]    Report Status 02/05/2021 FINAL  Final    Pertinent Imaging Repeat MRI L spine 02/09/21 IMPRESSION: 1. Prior PLIF at L1 through S1. 2. 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 Residual moderate spinal stenosis at the L1-2 level. 3. Associated paraspinous edema/phlegmon within the adjacent left greater than right psoas musculature, with superimposed 1 cm left psoas soft tissue abscess. 4. Extensive postoperative changes throughout the posterior paraspinous soft tissues without discrete abscess or collection  Repeat MRI L spine 03/06/21 IMPRESSION: 1. Evaluation is significantly limited by susceptibility  artifact spinal hardware redemonstrated findings consistent with osteomyelitis discitis neck L1-L2, with epidural phlegmon and/or abscess within the left ventral epidural space, extending from L1-L2 L2-L3, overall similar to the prior exam. 2. Slight increase in the size of a previously noted left psoas abscess, with increased distension in conspicuity of a left anterior paraspinous abscess. 3. Extensive postoperative changes, status post L1-S1 posterior fusion and decompression, with enhancement and edema but without discrete fluid collection in the posterior soft tissues. 4. L1-L2 interbody disc spacer appears slightly more posterior and towards the right aspect of the vertebral body, although evaluation is limited by susceptibility artifact. Attention on follow-up.  All pertinent labs/Imagings/notes reviewed. All pertinent plain films and CT images have been personally visualized and interpreted; radiology reports have been reviewed. Decision making incorporated into the Impression / Recommendations.  I have spent a total of 45 minutes of face-to-face and non-face-to-face time, excluding clinical staff time, preparing to see patient, ordering tests and/or medications, and provide counseling the patient    Electronically signed by:  Rosiland Oz, MD Infectious Disease Physician [facility] [address] [place],  [address] Phone: [phone]  Fax: [phone]

## 2021-03-12 NOTE — Telephone Encounter (Signed)
I spoke with [name] with [facility] and verbal orders given for IV antibiotics to end on 12/29 and picc line can be removed after last dose per Dr. [place] Bali. [name] verbalized understanding. [name]

## 2021-03-12 NOTE — Telephone Encounter (Signed)
Thank you :)

## 2021-03-13 ENCOUNTER — Inpatient Hospital Stay: Payer: 59 | Admitting: Infectious Diseases

## 2021-03-13 DIAGNOSIS — Z5181 Encounter for therapeutic drug level monitoring: Secondary | ICD-10-CM | POA: Insufficient documentation

## 2021-03-13 DIAGNOSIS — T847XXA Infection and inflammatory reaction due to other internal orthopedic prosthetic devices, implants and grafts, initial encounter: Secondary | ICD-10-CM | POA: Insufficient documentation

## 2021-03-14 ENCOUNTER — Ambulatory Visit: Payer: 59 | Admitting: Infectious Diseases

## 2021-03-15 ENCOUNTER — Encounter: Payer: Self-pay | Admitting: Physical Medicine and Rehabilitation

## 2021-03-15 MED ORDER — HYDROMORPHONE HCL 2 MG PO TABS: 2.0000 mg | ORAL_TABLET | ORAL | 0 refills | Status: DC

## 2021-03-15 MED ORDER — OXYCODONE HCL 15 MG PO TABS: 15.0000 mg | ORAL_TABLET | Freq: Three times a day (TID) | ORAL | 0 refills | Status: DC | PRN

## 2021-03-15 MED ORDER — MORPHINE SULFATE ER 15 MG PO TBCR: 30.0000 mg | EXTENDED_RELEASE_TABLET | Freq: Two times a day (BID) | ORAL | 0 refills | Status: DC

## 2021-03-15 NOTE — Telephone Encounter (Signed)
Pt calling to get refill of pain meds.   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 He's had 1 UTI in last 2 months, but not 2, so doesn't qualify at this time for closed system catheters.  I need him to get catheters for cathing 5-6x/per day along with the supplies needed for cathing- lube, gloves (extra large). It is not clear if his ability to void will return, but at this point, I have to say it will be at least 1 year, even on the max dose of all bladder meds, he hasn't regained the ability to void at all.

## 2021-03-18 ENCOUNTER — Telehealth: Payer: Self-pay

## 2021-03-18 NOTE — Telephone Encounter (Signed)
PA for Morphine Sulfate sent to [facility]

## 2021-03-19 ENCOUNTER — Other Ambulatory Visit: Payer: Self-pay

## 2021-03-19 ENCOUNTER — Encounter: Payer: Self-pay | Admitting: *Deleted

## 2021-03-19 ENCOUNTER — Other Ambulatory Visit (HOSPITAL_COMMUNITY): Payer: Self-pay | Admitting: Neurological Surgery

## 2021-03-19 ENCOUNTER — Ambulatory Visit (INDEPENDENT_AMBULATORY_CARE_PROVIDER_SITE_OTHER): Payer: 59 | Admitting: Surgical

## 2021-03-19 ENCOUNTER — Other Ambulatory Visit: Payer: Self-pay | Admitting: Neurological Surgery

## 2021-03-19 ENCOUNTER — Encounter: Payer: Self-pay | Admitting: Surgical

## 2021-03-19 VITALS — BP 91/60 | HR 97 | Ht 74.0 in | Wt 275.0 lb

## 2021-03-19 DIAGNOSIS — S21209A Unspecified open wound of unspecified back wall of thorax without penetration into thoracic cavity, initial encounter: Secondary | ICD-10-CM

## 2021-03-19 DIAGNOSIS — M5416 Radiculopathy, lumbar region: Secondary | ICD-10-CM

## 2021-03-19 DIAGNOSIS — G062 Extradural and subdural abscess, unspecified: Secondary | ICD-10-CM | POA: Diagnosis not present

## 2021-03-19 NOTE — Progress Notes (Signed)
Referring Provider Colon Branch, MD [address] [place],  [place] [address]   CC:  Chief Complaint  Patient presents with   Advice Only      [name] is an 55 y.o. male.   HPI: 55 year old male with a lumbar back wound.  He was admitted for elective back surgery and subsequently developed a lumbar wound infection which required I&D and subsequent wound VAC placement.  Blood cultures and wound cultures while hospitalized showed Kleb pneumonia.  He was unfortunately discharged early due to insurance issues.  Patient has been evaluated by the [facility] wound center and they are recommending continuing with wound VAC changes twice per week.  His initial visit with the wound care team was 03/14/2021.  Patient reports today that he is overall doing well.  He reports his wife has changed the dressing and wound VAC once since his discharge from the hospital, she overall did a good job but he states she was not comfortable doing it any longer.  He is not having any infectious symptoms.  He does report that he has been evaluated by Dr. Venetia Constable with neurosurgery and they discussed the timeline of healing of approximately 6 months.  He is here with a friend today.  He is not having any specific complaints.  He does report that he is scheduled to see the wound care clinic in [place] next week.  The [facility] wound care center was going to set up home health, but patient reports that there is no availability.   Allergies  Allergen Reactions   Cymbalta [Duloxetine Hcl] Other (See Comments)   Penicillins Other (See Comments)    REACTION: Blister and Sores in the mouth    Outpatient Encounter Medications as of 03/19/2021  Medication Sig   ascorbic acid (VITAMIN C) 500 MG tablet Take 1 tablet (500 mg total) by mouth 2 (two) times daily.   blood glucose meter kit and supplies Use up to four times daily as directed. Dx: E11.9   Blood  Glucose Monitoring Suppl ([place]) w/Device KIT Test blood sugar twice a day.  Dx code: E11.9   ceFAZolin (ANCEF) IVPB Inject 2 g into the vein every 8 (eight) hours. Indication:  bacteremia/diskitis First Dose: No Last Day of Therapy:  04/11/21 Labs - Once weekly:  CBC/D and BMP, Labs - Every other week:  ESR and CRP Method of administration: IV Push Method of administration may be changed at the discretion of home infusion pharmacist based upon assessment of the patient and/or caregiver's ability to self-administer the medication ordered.   citalopram (CELEXA) 20 MG tablet Take 1 tablet (20 mg total) by mouth daily.   cyclobenzaprine (FLEXERIL) 10 MG tablet Take 1 tablet (10 mg total) by mouth 3 (three) times daily as needed for muscle spasms.   diazepam (VALIUM) 5 MG tablet Take 1 tablet (5 mg total) by mouth at bedtime.   enoxaparin (LOVENOX) 40 MG/0.4ML injection Inject 0.4 mLs (40 mg total) into the skin daily.   fluticasone (FLONASE) 50 MCG/ACT nasal spray Place 1 spray into both nostrils daily.   gabapentin (NEURONTIN) 400 MG capsule Take 2 capsules (800 mg total) by mouth 3 (three) times daily.   glucose blood (ONETOUCH VERIO) test strip Check blood sugars before meals and at bedtime.  twice daily   HYDROmorphone (DILAUDID) 2 MG tablet Take 1 tablet (2 mg total) by mouth every Monday, Wednesday, and  Friday. Take 30 minutes prior to Wound VAC change   lidocaine (LIDODERM) 5 % Place 2 patches onto the skin daily. At 8 pm and remove at 8 am daily   losartan (COZAAR) 100 MG tablet Take 1 tablet (100 mg total) by mouth daily.   Menthol-Methyl Salicylate (MUSCLE RUB) 10-15 % CREA Apply 1 application topically 4 (four) times daily -  with meals and at bedtime.   metFORMIN (GLUCOPHAGE) 1000 MG tablet Take 1 tablet (1,000 mg total) by mouth 2 (two) times daily with a meal.   morphine (MS CONTIN) 15 MG 12 hr tablet Take 2 tablets (30 mg total) by mouth every 12 (twelve) hours.    nutrition supplement, JUVEN, (JUVEN) PACK Take 1 packet by mouth 2 (two) times daily between meals.   ondansetron (ZOFRAN) 4 MG tablet Take 1 tablet (4 mg total) by mouth every 8 (eight) hours as needed for nausea or vomiting. Can cause constipation- monitor-   OneTouch Delica Lancets 54M MISC Test blood sugar before meals and at bedtime.  Dx code: E11.9   oxyCODONE (ROXICODONE) 15 MG immediate release tablet Take 1 tablet (15 mg total) by mouth every 8 (eight) hours as needed ((score 7 to 10)).   polyethylene glycol (MIRALAX / GLYCOLAX) 17 g packet Take 17 g by mouth 2 (two) times daily.   protein supplement (RESOURCE BENEPROTEIN) POWD Take 6 g by mouth 3 (three) times daily with meals.   senna (SENOKOT) 8.6 MG TABS tablet Take 3 tablets (25.8 mg total) by mouth 2 (two) times daily.   simethicone (MYLICON) 80 MG chewable tablet Chew 1 tablet (80 mg total) by mouth 4 (four) times daily as needed for flatulence.   tadalafil (CIALIS) 20 MG tablet TAKE 1/2 TO 1 TABLET BY MOUTH DAILY AS NEEDED FOR ERECTILE DYSFUNCTION   tamsulosin (FLOMAX) 0.4 MG CAPS capsule Take 2 capsules (0.8 mg total) by mouth daily.   tiZANidine (ZANAFLEX) 4 MG tablet Take 1 tablet (4 mg total) by mouth 3 (three) times daily.   Zinc Sulfate 220 (50 Zn) MG TABS Take 1 tablet (220 mg total) by mouth daily.   rosuvastatin (CRESTOR) 20 MG tablet Take 1 tablet (20 mg total) by mouth daily.   No facility-administered encounter medications on file as of 03/19/2021.     Past Medical History:  Diagnosis Date   DDD (degenerative disc disease), lumbar    Diabetes mellitus    Hyperlipemia    Hypertension    Obesity    OSA (obstructive sleep apnea)    on CPAP    Past Surgical History:  Procedure Laterality Date   APPLICATION OF ROBOTIC ASSISTANCE FOR SPINAL PROCEDURE N/A 01/11/2021   Procedure: APPLICATION OF ROBOTIC ASSISTANCE FOR SPINAL PROCEDURE;  Surgeon: Judith Part, MD;  Location: [facility];  Service: Neurosurgery;   Laterality: N/A;   APPLICATION OF WOUND VAC N/A 01/25/2021   Procedure: APPLICATION OF WOUND VAC;  Surgeon: Dawley, Theodoro Doing, DO;  Location: [facility];  Service: Neurosurgery;  Laterality: N/A;   BACK SURGERY     COLONOSCOPY  2019   LUMBAR WOUND DEBRIDEMENT N/A 01/25/2021   Procedure: Irrigation and Debridement of lumbar wound, and wound vacuum assisted closure;  Surgeon: Dawley, Theodoro Doing, DO;  Location: [facility];  Service: Neurosurgery;  Laterality: N/A;   TRANSFORAMINAL LUMBAR INTERBODY FUSION (TLIF) WITH PEDICLE SCREW FIXATION 4 LEVEL N/A 01/11/2021   Procedure: Lumbar one-two, Lumbar two-three, Lumbar three-four, Lumbar four-five, Lumbar five Sacral one Open decompression, Transforaminal lumbar interbody fusion,  posterolateral instrumented fusion;  Surgeon: Judith Part, MD;  Location: [facility];  Service: Neurosurgery;  Laterality: N/A;    Family History  Problem Relation Age of Onset   Diabetes Mother    Hypertension Neg Hx    Coronary artery disease Neg Hx    Colon cancer Neg Hx    Prostate cancer Neg Hx     Social History   Social History Narrative   Remarried for the 3rd time w/ a lady for the [place]   Children:  2010 , 2012   Left handed                  Review of Systems General: Denies fevers, chills, weight loss CV: Denies chest pain, shortness of breath, palpitations   Physical Exam Vitals with BMI 03/19/2021 03/12/2021 03/08/2021  Height '6\' 2"'  - -  Weight 275 lbs - -  BMI 62.22 - -  Systolic 91 979 892  Diastolic 60 86 76  Pulse 97 108 104    General:  No acute distress,  Alert and oriented, Non-Toxic, Normal speech and affect Back: Large lumbar back wound that is 18 x 7 x 3.5 cm.  There is a good base of granulation tissue noted.  Very scant amount of fibrinous exudate noted.  I do not appreciate any exposed hardware.  There is some exposed muscle and fascia.  No foul odors are noted.  Minimal drainage is noted.  Drainage is serosanguineous.  No surrounding  cellulitic changes.  Nontender to palpation in the periwound area.      Assessment/Plan 55 year old male status post lumbar back surgery with subsequent infection.  He underwent debridement and now has a large back wound that has been managed at this time with wound VAC dressing changes.  There has been some difficulty obtaining home health services.  Of note he was discharged from the hospital early due to insurance issues.  He is currently medicated and reports minimal pain.  He is not having any infectious symptoms.  He has been seen by wound care and [place] with [facility] and is scheduled to be seen by wound care here at [facility] next week.  He will need wound VAC dressing changes at least twice per week, I have discussed with him that his wife may need to change the wound VAC at home given there is no availability for home health RNs.  We discussed that at this point there is no plans for surgical intervention, recommend continuing with wound VAC changes and we will plan to reevaluate in a few weeks for continued improvement and to further evaluate need for surgical intervention.  Patient is understanding of this and all of his questions were answered to his content.  Recommend continuing to follow with wound care.  Pictures were obtained of the patient and placed in the chart with the patient's or guardian's permission.   [name] 03/19/2021, 11:28 AM

## 2021-03-19 NOTE — Telephone Encounter (Signed)
Approved for 6 months through 09/16/2021. Pharmacy and Mr Dockendorf notified.

## 2021-03-21 ENCOUNTER — Telehealth: Payer: Self-pay

## 2021-03-21 NOTE — Telephone Encounter (Signed)
Returned [name]'s with [employer] call. She was inquiring for the measurements of wound during the period of 11/14/-11/18 and 11/21-11/25. Advised her patient had surgery with Dr. Janann Colonel with [facility] on 01/25/2021. At that time the wound vac was applied. The first office visit with patient was yesterday. The measurement was 18x7x3.5. She will need to call other office and request other information. I did advise patient needs more supplies.

## 2021-03-22 ENCOUNTER — Ambulatory Visit (HOSPITAL_COMMUNITY)
Admission: RE | Admit: 2021-03-22 | Discharge: 2021-03-22 | Disposition: A | Discharge status: Home / Self Care | Payer: 59 | Source: Ambulatory Visit | Attending: Infectious Diseases | Admitting: Infectious Diseases

## 2021-03-22 ENCOUNTER — Other Ambulatory Visit: Payer: Self-pay

## 2021-03-22 DIAGNOSIS — G062 Extradural and subdural abscess, unspecified: Secondary | ICD-10-CM | POA: Insufficient documentation

## 2021-03-22 IMAGING — MR MR LUMBAR SPINE WO/W CM
4 of 7 series · 23 of 48 positions shown · IV contrast (MH)
Comparison: [DATE], [DATE]

CLINICAL DATA: History of discitis.  Evaluate for epidural abscess.

EXAM:
MRI LUMBAR SPINE WITHOUT AND WITH CONTRAST
TECHNIQUE: Multiplanar and multiecho pulse sequences of the lumbar spine were
obtained without and with intravenous contrast.
CONTRAST:  10mL GADAVIST GADOBUTROL 1 MMOL/ML IV SOLN

[Series 5: T2 · sagittal · 4.0mm · 0.73mm/px · 4 of 18 slices shown (1 of 2)]
[im 1/18]
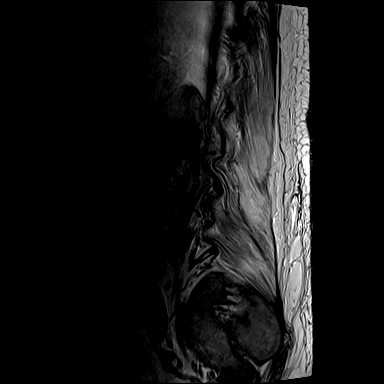
[im 6/18]
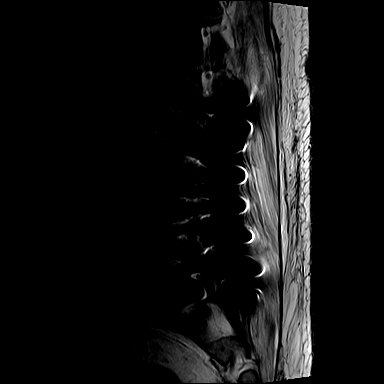
[im 12/18]
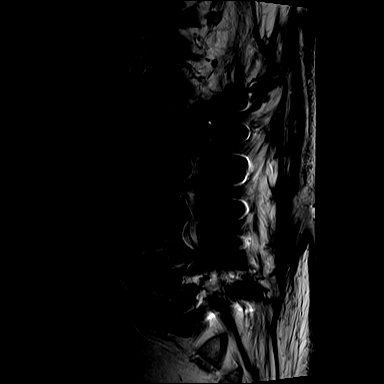
[im 18/18]
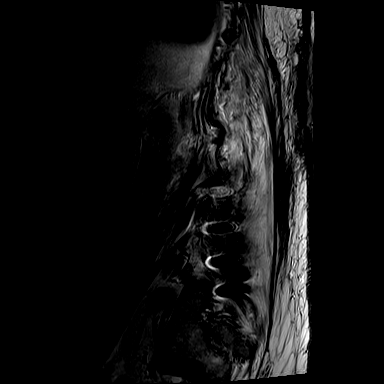

[Series 7: T1 · sagittal · 4.0mm · 0.88mm/px · 5 of 18 slices shown (1 of 2)]
[im 1/18]
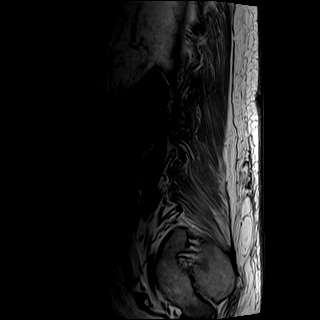
[im 5/18]
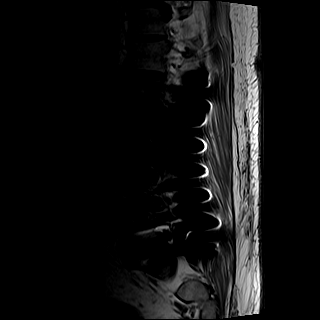
[im 9/18]
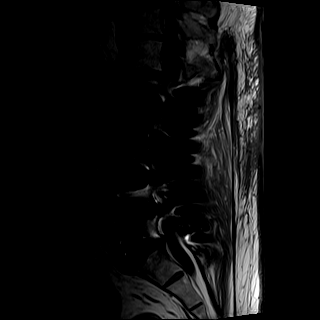
[im 13/18]
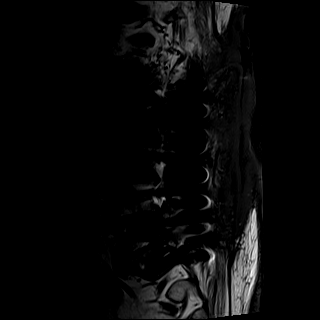
[im 18/18]
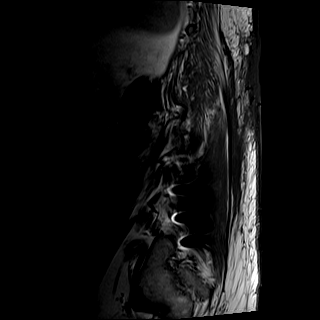

[Series 8: T2 · axial · 4.0mm · 0.57mm/px · z∈[-148,+74]mm · 8 of 38 slices shown (2 of 2)]
[im 1/38]
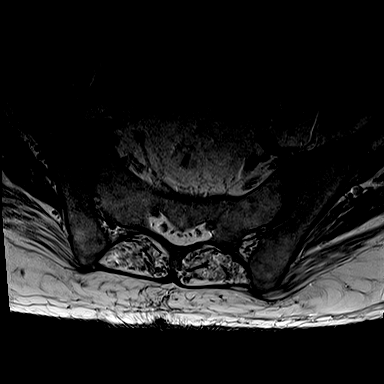
[im 5/38]
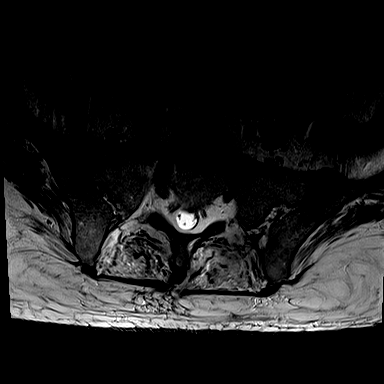
[im 13/38]
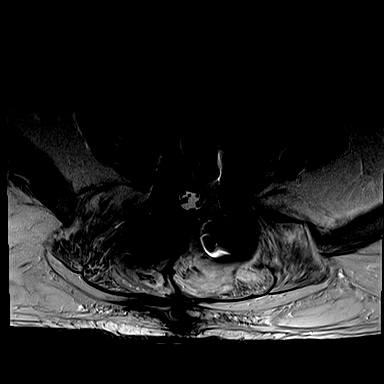
[im 17/38]
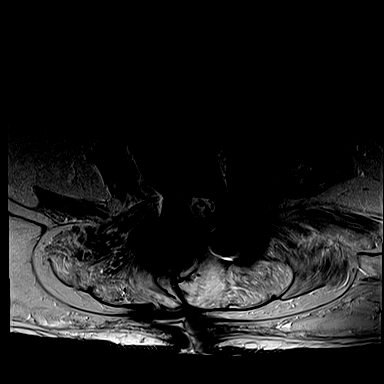
[im 21/38]
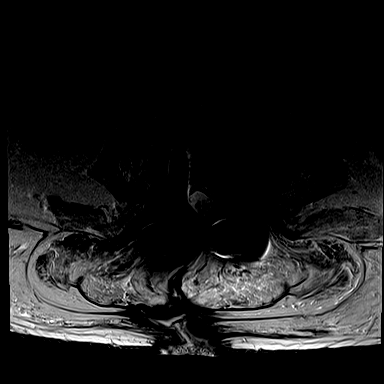
[im 25/38]
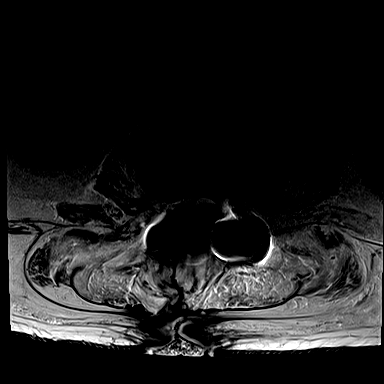
[im 33/38]
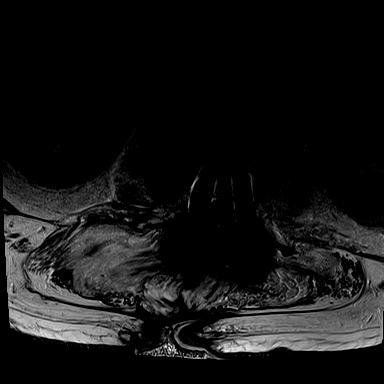
[im 38/38]
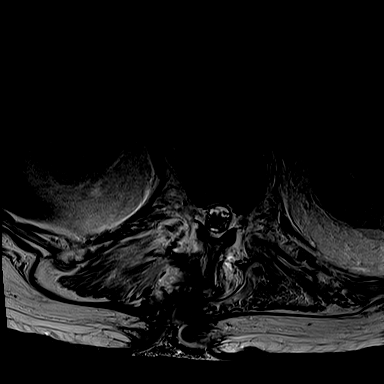

[Series 9: T1 · axial · 4.0mm · 0.34mm/px · z∈[-148,+49]mm · 6 of 38 slices shown (2 of 2)]
[im 1/38]
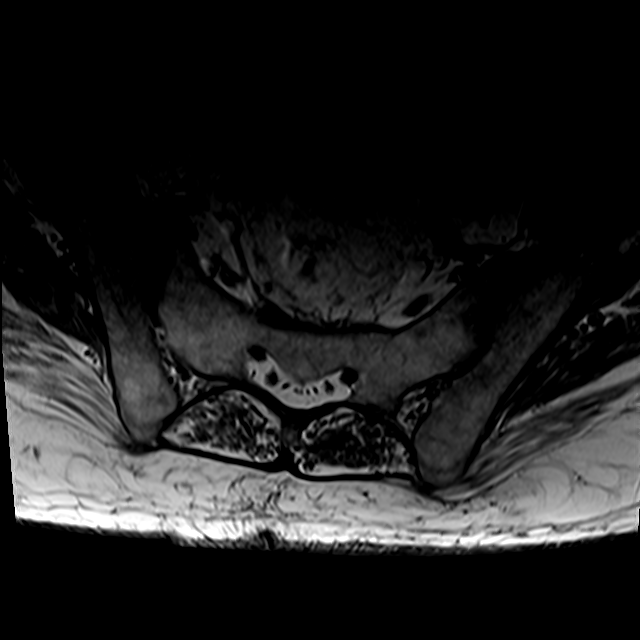
[im 5/38]
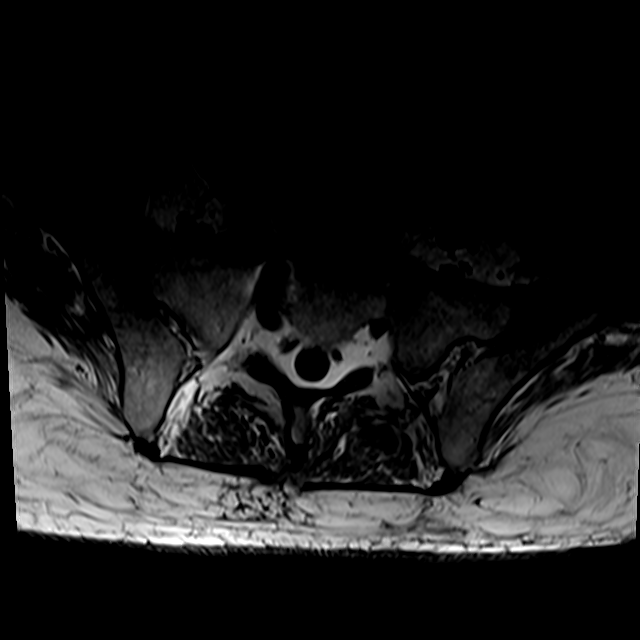
[im 13/38]
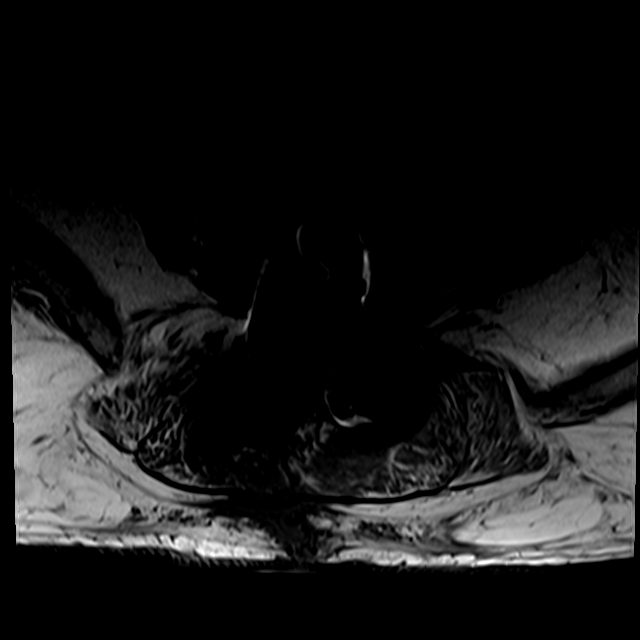
[im 17/38]
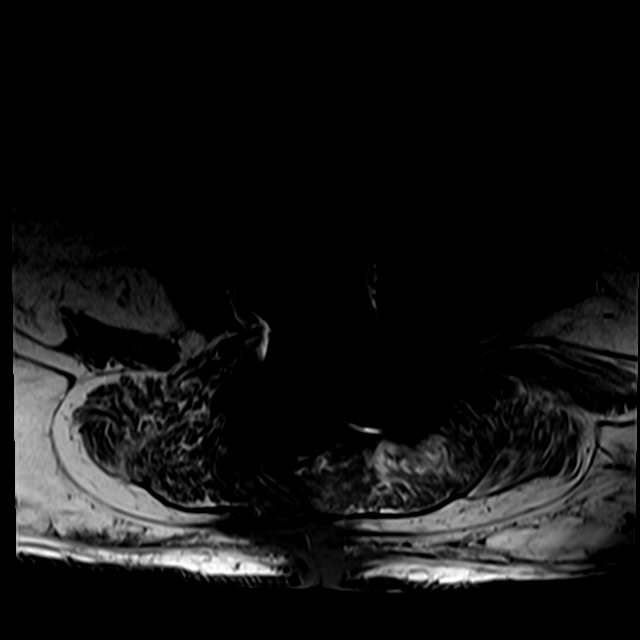
[im 21/38]
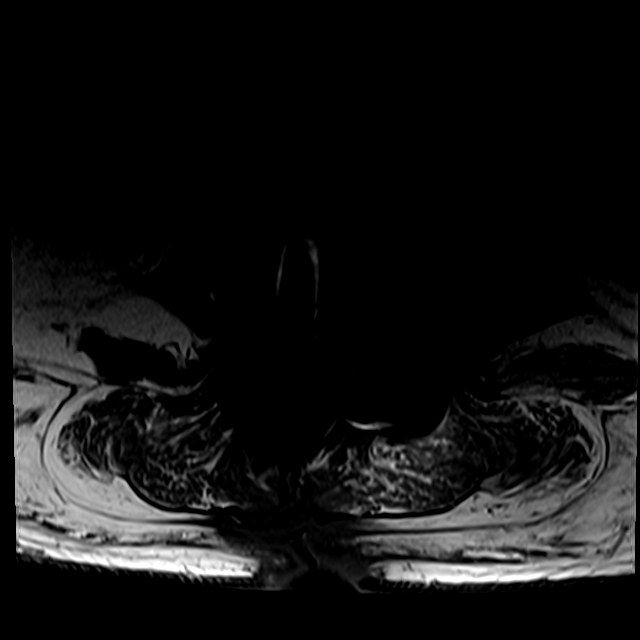
[im 33/38]
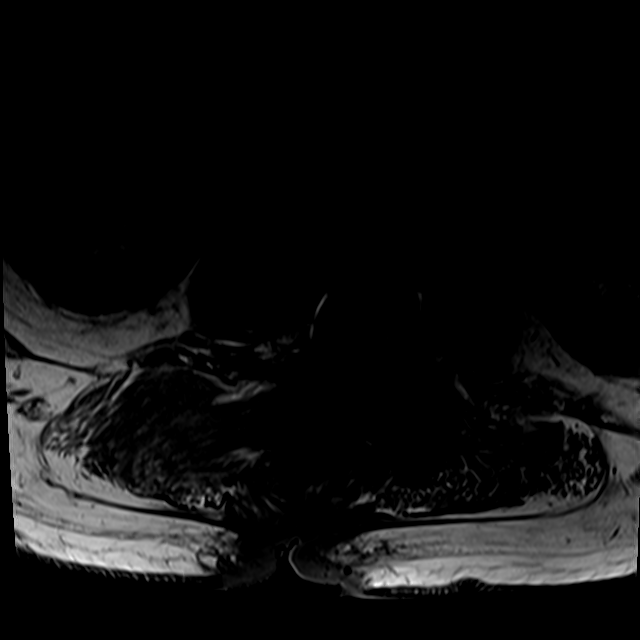

[23 of 48 positions shown; findings below may reference images not displayed]

FINDINGS: Segmentation:  Standard.

Alignment:  2 mm retrolisthesis of L1 on L2.

Vertebrae: No acute fracture. No aggressive osseous lesion. Abnormal
fluid signal in the L1-L2 disc space and severe bone marrow edema in
the L1 and L2 vertebral bodies consistent with
discitis-osteomyelitis. Persistent mild paraspinous phlegmon which
has improved compared with the prior exam. Persistent bilateral
psoas muscle myositis without a drainable fluid collection. No
drainable epidural fluid collection. Epidural phlegmonous changes
along the ventral aspect at the level of L1-L3, left greater than
right with mild deformity of the thecal sac.

Conus medullaris and cauda equina: Conus extends to the L1 level.
Conus and cauda equina appear normal.

Paraspinal and other soft tissues: Postsurgical changes in the
posterior paraspinal soft tissues. Soft tissue wound along the
posterior aspect of the lumbar spine extending from T12 through L5
with packing material. Soft tissue edema in the paraspinous
musculature throughout the lumbar spine.

Disc levels:

Disc spaces: Posterior lumbar interbody fusion from L1 through S1.
Degenerative disease with disc height loss at T11-12.

T11-12: Mild broad-based disc bulge. No foraminal or central canal
stenosis.

T12-L1: No significant disc bulge. No neural foraminal stenosis. No
central canal stenosis.

L1-L2: Posterior lumbar interbody fusion. Persistent moderate thecal
sac narrowing secondary to epidural phlegmon. No left foraminal
narrowing. No definite right foraminal narrowing, but evaluation is
limited secondary to susceptibility artifact. Interbody spacer is
unchanged in position compared with [DATE], but appears to be
slightly more posteriorly displaced into the right compared with
[DATE].

L2-L3: Posterior lumbar interbody fusion. Mild left foraminal
narrowing. No right foraminal stenosis. No central canal narrowing.

L3-L4: Posterior lumbar interbody fusion. Mild left foraminal
stenosis. No right foraminal stenosis. No central canal stenosis.

L4-L5: Posterior lumbar interbody fusion. Mild left foraminal
stenosis. No right foraminal stenosis. No central canal stenosis.

L5-S1: Posterior lumbar interbody fusion. Severe right and mild left
foraminal stenosis. No central canal stenosis.
IMPRESSION: 1. Persistent discitis-osteomyelitis at L1-2 with a ventral epidural
phlegmon extending from L1-2 through L2-3 similar in appearance to
the prior examination. Persistent myositis of the psoas muscles
bilaterally, left greater than right likely secondary to an
infectious etiology. No drainable abscess.
2. Posterior lumbar interbody fusion from L1 through S1 as described
above.
3. Postsurgical changes in the posterior paraspinal soft tissues.
Soft tissue wound along the posterior aspect of the lumbar spine
extending from T12 through L5 with packing material. Soft tissue
edema in the paraspinous musculature throughout the lumbar spine.

## 2021-03-22 MED ORDER — GADOBUTROL 1 MMOL/ML IV SOLN
10.0000 mL | Freq: Once | INTRAVENOUS | Status: AC | PRN
  Administered 2021-03-22: 10 mL via INTRAVENOUS

## 2021-03-28 ENCOUNTER — Other Ambulatory Visit: Payer: Self-pay

## 2021-03-28 ENCOUNTER — Encounter (HOSPITAL_BASED_OUTPATIENT_CLINIC_OR_DEPARTMENT_OTHER): Payer: 59 | Attending: Internal Medicine | Admitting: Internal Medicine

## 2021-03-28 ENCOUNTER — Other Ambulatory Visit: Payer: Self-pay | Admitting: Internal Medicine

## 2021-03-28 DIAGNOSIS — G4733 Obstructive sleep apnea (adult) (pediatric): Secondary | ICD-10-CM | POA: Diagnosis not present

## 2021-03-28 DIAGNOSIS — E11622 Type 2 diabetes mellitus with other skin ulcer: Secondary | ICD-10-CM | POA: Insufficient documentation

## 2021-03-28 DIAGNOSIS — Z981 Arthrodesis status: Secondary | ICD-10-CM | POA: Insufficient documentation

## 2021-03-28 DIAGNOSIS — E1169 Type 2 diabetes mellitus with other specified complication: Secondary | ICD-10-CM | POA: Insufficient documentation

## 2021-03-28 DIAGNOSIS — Z7984 Long term (current) use of oral hypoglycemic drugs: Secondary | ICD-10-CM | POA: Diagnosis not present

## 2021-03-28 DIAGNOSIS — E669 Obesity, unspecified: Secondary | ICD-10-CM | POA: Insufficient documentation

## 2021-03-28 DIAGNOSIS — M869 Osteomyelitis, unspecified: Secondary | ICD-10-CM | POA: Insufficient documentation

## 2021-03-28 DIAGNOSIS — L98424 Non-pressure chronic ulcer of back with necrosis of bone: Secondary | ICD-10-CM | POA: Diagnosis not present

## 2021-03-28 NOTE — Progress Notes (Signed)
[name] ([id number]) Visit Report for 03/28/2021 Chief Complaint Document Details Patient Name: Date of Service: [name], [name] 03/28/2021 7:30 A M Medical Record Number: [id number] Patient Account Number: [id number] Date of Birth/Sex: Treating RN: 16-Nov-1965 (55 y.o. Male) Primary Care Provider: [name] Other Clinician: Referring Provider: Treating Provider/Extender: [name] Weeks in Treatment: 0 Information Obtained from: Patient Chief Complaint Back wound Electronic Signature(s) Signed: 03/28/2021 9:27:22 AM By: Kalman Shan DO Entered By: Kalman Shan on 03/28/2021 08:59:05 -------------------------------------------------------------------------------- HPI Details Patient Name: Date of Service: Christian Brown E. 03/28/2021 7:30 A M Medical Record Number: [id number] Patient Account Number: [id number] Date of Birth/Sex: Treating RN: 01/06/1966 (55 y.o. Male) Primary Care Provider: [name] Other Clinician: Referring Provider: Treating Provider/Extender: [name] Weeks in Treatment: 0 History of Present Illness HPI Description: Admission 03/28/2021 [name] is a 55 year old male with a past medical history of controlled type 2 diabetes on oral agents, obesity and OSA that presents to the clinic for a back wound. On 01/11/2021 patient had a laminectomy with PLIF of L1-S1 by Dr. Venetia Constable because of lumbar stenosis and radiculopathy. He subsequently developed bacteremia. He had CT imaging on 10/13 of the lumbar spine that showed fluid collection in the soft tissue of the posterior L1 and S1 and was taken to the OR for washout on 10/14. 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 He received 4 weeks of IV antibiotics by infectious disease. After his completion of 4 weeks of IV antibiotics he was continued for an additional 4 weeks of IV cefazolin with a stop date of 12/29. He has been evaluated by  plastic surgery and no plans for surgical intervention at this time. Wife is present and reports he has been on the wound VAC for the past 8 weeks with improvement in wound healing. He currently denies systemic signs of infection. Electronic Signature(s) Signed: 03/28/2021 9:27:22 AM By: Kalman Shan DO Entered By: Kalman Shan on 03/28/2021 09:18:44 -------------------------------------------------------------------------------- Physical Exam Details Patient Name: Date of Service: Christian Brown E. 03/28/2021 7:30 A M Medical Record Number: [id number] Patient Account Number: [id number] Date of Birth/Sex: Treating RN: 07/03/1965 (55 y.o. Male) Primary Care Provider: [name] Other Clinician: Referring Provider: Treating Provider/Extender: [name] Weeks in Treatment: 0 Constitutional respirations regular, non-labored and within target range for patient.Marland Kitchen Psychiatric pleasant and cooperative. Notes Large open wound to the back. Muscle exposed. Granulation tissue throughout. There is undermining and tunneling. No probing to bone. No signs of infection. Electronic Signature(s) Signed: 03/28/2021 9:27:22 AM By: Kalman Shan DO Entered By: Kalman Shan on 03/28/2021 09:21:05 -------------------------------------------------------------------------------- Physician Orders Details Patient Name: Date of Service: [name] 03/28/2021 7:30 A M Medical Record Number: [id number] Patient Account Number: [id number] Date of Birth/Sex: Treating RN: 12-Jul-1965 (55 y.o. Male) Lorrin Jackson Primary Care Provider: [name] Other Clinician: Referring Provider: Treating Provider/Extender: [name] Weeks in Treatment: 0 Verbal / Phone Orders: No Diagnosis Coding Follow-up Appointments ppointment in 1 week. - Thursday with Dr. Heber [place] Return A Nurse Visit: - For Vac Change Other: - Repeat MRI 04/02/21 ID f/u 04/11/21 Bathing/ Shower/  Hygiene May shower with protection but do not get wound dressing(s) wet. Negative Presssure Wound Therapy Wound Vac to wound continuously at 173mm/hg pressure Black Foam - wound base then bridge to right side White Foam - T tunnel at 12:00 and undermining from 4:00-6:00 o Additional Orders / Instructions Follow Nutritious Diet -  Continue to monitor blood sugars Wound Treatment Electronic Signature(s) Signed: 03/28/2021 9:27:22 AM By: Kalman Shan DO Entered By: Kalman Shan on 03/28/2021 09:21:20 -------------------------------------------------------------------------------- Problem List Details Patient Name: Date of Service: Christian Brown E. 03/28/2021 7:30 A M Medical Record Number: [id number] Patient Account Number: [id number] Date of Birth/Sex: Treating RN: April 04, 1966 (55 y.o. 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 03/28/2021 7:30 A M Medical Record Number: [id number] Patient Account Number: [id number] Date of Birth/Sex: Treating RN: Jan 31, 1966 (55 y.o. Male) Primary Care Provider: [name] Other Clinician: Referring Provider: Treating Provider/Extender: [name] Weeks in Treatment: 0 Subjective Chief Complaint Information obtained from  Patient Back wound History of Present Illness (HPI) Admission 03/28/2021 [name] is a 55 year old male with a past medical history of controlled type 2 diabetes on oral agents, obesity and OSA that presents to the clinic for a back wound. On 01/11/2021 patient had a laminectomy with PLIF of L1-S1 by Dr. Venetia Constable because of lumbar stenosis and radiculopathy. He subsequently developed bacteremia. He had CT imaging on 10/13 of the lumbar spine that showed fluid collection in the soft tissue of the posterior L1 and S1 and was taken to the OR for washout on 10/14. 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 He received 4 weeks of IV antibiotics by infectious disease. After his completion of 4 weeks of IV antibiotics he was continued for an additional 4 weeks of IV cefazolin with a stop date of 12/29. He has been evaluated by plastic surgery and no plans for surgical intervention at this time. Wife is present and reports he has been on the wound VAC for the past 8 weeks with improvement in wound healing. He currently denies systemic signs of infection. Patient History Information obtained from Patient. Allergies penicillin (Reaction: blisters in mouth), Cymbalta Family History Diabetes - Mother, Stroke - Siblings, No family history of Cancer, Heart Disease, Hereditary Spherocytosis, Hypertension, Kidney Disease, Lung Disease, Seizures, Thyroid Problems, Tuberculosis. Social History Never smoker, Marital Status - Married, Alcohol Use - Rarely, Drug Use - No History, Caffeine Use - Rarely. Medical History Cardiovascular Patient has history of Hypertension Endocrine Patient has history of Type II Diabetes Musculoskeletal Patient has history of Osteomyelitis Patient is treated with Oral Agents. Blood sugar is tested. Medical A Surgical History Notes nd Musculoskeletal DDD Review of Systems (ROS) Eyes Denies complaints or symptoms of Dry Eyes, Vision  Changes, Glasses / Contacts. Ear/Nose/Mouth/Throat Denies complaints or symptoms of Chronic sinus problems or rhinitis. Respiratory Denies complaints or symptoms of Chronic or frequent coughs, Shortness of Breath. Gastrointestinal Denies complaints or symptoms of Frequent diarrhea, Nausea, Vomiting. Genitourinary Denies complaints or symptoms of Frequent urination. Integumentary (Skin) Complains or has symptoms of Wounds. Musculoskeletal Complains or has symptoms of Muscle Weakness. Neurologic Denies complaints or symptoms of Numbness/parasthesias. Psychiatric Denies complaints or symptoms of Claustrophobia, Suicidal. Objective Constitutional respirations regular, non-labored and within target range for patient.. Vitals Time Taken: 8:00 AM, Height: 74  in, Source: Stated, Temperature: 98.0 F, Pulse: 123 bpm, Respiratory Rate: 20 breaths/min, Blood Pressure: 96/60 mmHg. Psychiatric pleasant and cooperative. General Notes: Large open wound to the back. Muscle exposed. Granulation tissue throughout. There is undermining and tunneling. No probing to bone. No signs of infection. Integumentary (Hair, Skin) Wound #1 status is Open. Original cause of wound was Surgical Injury. The date acquired was: 01/11/2021. The wound is located on the Midline Back. The wound measures 17.5cm length x 5.5cm width x 2.3cm depth; 75.595cm^2 area and 173.868cm^3 volume. There is Fat Layer (Subcutaneous Tissue) exposed. Tunneling has been noted at 12:00 with a maximum distance of 4.6cm. Undermining begins at 4:00 and ends at 6:00 with a maximum distance of 2.7cm. There is a medium amount of serosanguineous drainage noted. The wound margin is well defined and not attached to the wound base. There is large (67-100%) red, pink granulation within the wound bed. There is a small (1-33%) amount of necrotic tissue within the wound bed including Adherent Slough. 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 He has osteomyelitis confirmed on MRI on 02/09/2021. He has been following with infectious disease and will complete a total of 8 weeks of IV antibiotics with an end date of 04/11/2021. He has been following with wound care at [facility]. They have been changing the wound VAC for him. Patient reports improvement in wound healing. T oday there are no signs of infection. We will continue the wound VAC twice weekly. There are no plans for plastic surgery at this time. Follow-up next week. 47 minutes was spent on the encounter including face-to-face, EMR review and coordination of care Plan Follow-up Appointments: Return Appointment in 1 week. - Thursday with Dr. Heber [place] Nurse Visit: - For Vac Change Other: - Repeat MRI 04/02/21 ID f/u 04/11/21 Bathing/ Shower/ Hygiene: May shower with protection but do not get wound dressing(s) wet. Negative Presssure Wound Therapy: Wound Vac to wound continuously at 122mm/hg pressure Black Foam - wound base then bridge to right side White Foam - T tunnel at 12:00 and undermining from 4:00-6:00 o Additional Orders / Instructions: Follow Nutritious Diet - Continue to monitor blood sugars 1. Wound VAC twice weekly 2. Nurse visit weekly 3. Follow-up in 1 week with me Electronic Signature(s) Signed: 03/28/2021 9:27:22 AM By: Kalman Shan DO Entered By: Kalman Shan on 03/28/2021 09:26:33 -------------------------------------------------------------------------------- HxROS Details Patient Name: Date of Service: Christian Brown E. 03/28/2021 7:30 A M Medical Record Number: [id number] Patient Account Number: [id number] Date of Birth/Sex: Treating RN: [birth year] (55 y.o. Male) Lorrin Jackson Primary Care Provider: [name] Other  Clinician: Referring Provider: Treating Provider/Extender: [name] Weeks in Treatment: 0 Information Obtained From Patient Eyes Complaints and Symptoms: Negative for: Dry Eyes; Vision Changes; Glasses / Contacts Ear/Nose/Mouth/Throat Complaints and Symptoms: Negative for: Chronic sinus problems or rhinitis Respiratory Complaints and Symptoms: Negative for: Chronic or frequent coughs; Shortness of Breath Gastrointestinal Complaints and Symptoms: Negative for: Frequent diarrhea; Nausea; Vomiting Genitourinary Complaints and Symptoms: Negative for: Frequent urination Integumentary (Skin) Complaints and Symptoms: Positive for: Wounds Musculoskeletal Complaints and Symptoms: Positive for: Muscle Weakness Medical History: Positive for: Osteomyelitis Past Medical History Notes: DDD Neurologic Complaints and Symptoms: Negative for: Numbness/parasthesias Psychiatric Complaints and Symptoms: Negative for: Claustrophobia; Suicidal Hematologic/Lymphatic Cardiovascular Medical History: Positive for: Hypertension Endocrine Medical  History: Positive for: Type II Diabetes Time with diabetes: Since mid 90's Treated with: Oral agents Blood sugar tested every day: Yes Tested : 2x day Immunological Oncologic Immunizations Pneumococcal Vaccine: Received Pneumococcal Vaccination: Yes Received Pneumococcal Vaccination On or After 60th Birthday: No Implantable Devices Yes Family and Social History Cancer: No; Diabetes: Yes - Mother; Heart Disease: No; Hereditary Spherocytosis: No; Hypertension: No; Kidney Disease: No; Lung Disease: No; Seizures: No; Stroke: Yes - Siblings; Thyroid Problems: No; Tuberculosis: No; Never smoker; Marital Status - Married; Alcohol Use: Rarely; Drug Use: No History; Caffeine Use: Rarely; Financial Concerns: No; Food, Clothing or Shelter Needs: No; Support System Lacking: No; Transportation Concerns: No Electronic  Signature(s) Signed: 03/28/2021 9:27:22 AM By: Kalman Shan DO Signed: 03/28/2021 5:42:26 PM By: Lorrin Jackson Entered By: Lorrin Jackson on 03/28/2021 08:05:30 -------------------------------------------------------------------------------- SuperBill Details Patient Name: Date of Service: [name] 03/28/2021 Medical Record Number: [id number] Patient Account Number: [id number] Date of Birth/Sex: Treating RN: [birth year] (55 y.o. 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

## 2021-03-28 NOTE — Progress Notes (Signed)
AMAHD, MORINO ([id number]) Visit Report for 03/28/2021 Abuse/Suicide Risk Screen Details Patient Name: Date of Service: [name], [name] 03/28/2021 7:30 A M Medical Record Number: [id number] Patient Account Number: [id number] Date of Birth/Sex: Treating RN: December 23, 1965 (55 y.o. Male) Lorrin Jackson Primary Care [name]: [name] Other Clinician: Referring Tevion Laforge: Treating Amberli Ruegg/Extender: [name] Weeks in Treatment: 0 Abuse/Suicide Risk Screen Items Answer ABUSE RISK SCREEN: Has anyone close to you tried to hurt or harm you recentlyo No Do you feel uncomfortable with anyone in your familyo No Has anyone forced you do things that you didnt want to doo No Electronic Signature(s) Signed: 03/28/2021 5:42:26 PM By: Lorrin Jackson Entered By: Lorrin Jackson on 03/28/2021 08:06:11 -------------------------------------------------------------------------------- Activities of Daily Living Details Patient Name: Date of Service: [name], [name] 03/28/2021 7:30 A M Medical Record Number: [id number] Patient Account Number: [id number] Date of Birth/Sex: Treating RN: 1966-01-31 (55 y.o. Male) Lorrin Jackson Primary Care Lindsey Demonte: [name] Other Clinician: Referring Sakia Schrimpf: Treating Narcissus Detwiler/Extender: [name] Weeks in Treatment: 0 Activities of Daily Living Items Answer Activities of Daily Living (Please select one for each item) Drive Automobile Not Able T Medications ake Completely Able Use T elephone Need Assistance Care for Appearance Need Assistance Use T oilet Completely Able Bath / Shower Need Assistance Dress Self Need Assistance Feed Self Completely Able Walk Need Assistance Get In / Out Bed Need Assistance Housework Not Able Prepare Meals Need Assistance Handle Money Completely Able Shop for Self Need Assistance Electronic Signature(s) Signed: 03/28/2021 5:42:26 PM By: Lorrin Jackson Entered By: Lorrin Jackson on 03/28/2021  08:06:53 -------------------------------------------------------------------------------- Education Screening Details Patient Name: Date of Service: Christian Brown E. 03/28/2021 7:30 A M Medical Record Number: [id number] Patient Account Number: [id number] Date of Birth/Sex: Treating RN: 1965/06/17 (55 y.o. Male) Lorrin Jackson Primary Care Trenesha Alcaide: [name] Other Clinician: Referring [name]: Treating Aubriegh Minch/Extender: [name] Weeks in Treatment: 0 Primary Learner Assessed: Patient Learning Preferences/Education Level/Primary Language Learning Preference: Explanation, Demonstration, Printed Material Highest Education Level: Grade School Preferred Language: English Cognitive Barrier Language Barrier: No Translator Needed: No Memory Deficit: No Emotional Barrier: No Cultural/Religious Beliefs Affecting Medical Care: No Physical Barrier Impaired Vision: No Impaired Hearing: No Decreased Hand dexterity: No Knowledge/Comprehension Knowledge Level: High Comprehension Level: High Ability to understand written instructions: High Ability to understand verbal instructions: High Motivation Anxiety Level: Calm Cooperation: Cooperative Education Importance: Acknowledges Need Interest in Health Problems: Asks Questions Perception: Coherent Willingness to Engage in Self-Management High Activities: Readiness to Engage in Self-Management High Activities: Electronic Signature(s) Signed: 03/28/2021 5:42:26 PM By: Lorrin Jackson Entered By: Lorrin Jackson on 03/28/2021 08:07:21 -------------------------------------------------------------------------------- Fall Risk Assessment Details Patient Name: Date of Service: Christian Brown E. 03/28/2021 7:30 A M Medical Record Number: [id number] Patient Account Number: [id number] Date of Birth/Sex: Treating RN: October 09, 1965 (55 y.o. Male) Lorrin Jackson Primary Care Kamisha Ell: [name] Other Clinician: Referring  [name]: Treating Jazma Pickel/Extender: [name] Weeks in Treatment: 0 Fall Risk Assessment Items Have you had 2 or more falls in the last 12 monthso 0 No Have you had any fall that resulted in injury in the last 12 monthso 0 No FALLS RISK SCREEN History of falling - immediate or within 3 months 25 Yes Secondary diagnosis (Do you have 2 or more medical diagnoseso) 15 Yes Ambulatory aid None/bed rest/wheelchair/nurse 0 No Crutches/cane/walker 15 Yes Furniture 0 No Intravenous therapy Access/Saline/Heparin Lock 0 No Gait/Transferring Normal/ bed rest/ wheelchair 0 No Weak (short steps with or without  shuffle, stooped but able to lift head while walking, may seek 10 Yes support from furniture) Impaired (short steps with shuffle, may have difficulty arising from chair, head down, impaired 0 No balance) Mental Status Oriented to own ability 0 Yes Electronic Signature(s) Signed: 03/28/2021 5:42:26 PM By: Lorrin Jackson Entered By: Lorrin Jackson on 03/28/2021 08:08:29 -------------------------------------------------------------------------------- Foot Assessment Details Patient Name: Date of Service: Christian Brown E. 03/28/2021 7:30 A M Medical Record Number: [id number] Patient Account Number: [id number] Date of Birth/Sex: Treating RN: Jan 29, 1966 (55 y.o. Male) Lorrin Jackson Primary Care Kodiak Rollyson: [name] Other Clinician: Referring [name]: Treating Lorain Keast/Extender: [name] Weeks in Treatment: 0 Foot Assessment Items Site Locations + = Sensation present, - = Sensation absent, C = Callus, U = Ulcer R = Redness, W = Warmth, M = Maceration, PU = Pre-ulcerative lesion F = Fissure, S = Swelling, D = Dryness Assessment Right: Left: Other Deformity: No No Prior Foot Ulcer: No No Prior Amputation: No No Charcot Joint: No No Ambulatory Status: Ambulatory With Help Assistance Device: Walker Gait: Steady Notes N/A: Back Wound Electronic  Signature(s) Signed: 03/28/2021 5:42:26 PM By: Lorrin Jackson Entered By: Lorrin Jackson on 03/28/2021 08:09:37 -------------------------------------------------------------------------------- Nutrition Risk Screening Details Patient Name: Date of Service: [name], [name] 03/28/2021 7:30 A M Medical Record Number: [id number] Patient Account Number: [id number] Date of Birth/Sex: Treating RN: 1965/05/27 (55 y.o. Male) Lorrin Jackson Primary Care Cecile Guevara: [name] Other Clinician: Referring Shantoria Ellwood: Treating Kennieth Plotts/Extender: [name] Weeks in Treatment: 0 Height (in): 74 Weight (lbs): Body Mass Index (BMI): Nutrition Risk Screening Items Score Screening NUTRITION RISK SCREEN: I have an illness or condition that made me change the kind and/or amount of food I eat 2 Yes I eat fewer than two meals per day 0 No I eat few fruits and vegetables, or milk products 0 No I have three or more drinks of beer, liquor or wine almost every day 0 No I have tooth or mouth problems that make it hard for me to eat 0 No I don't always have enough money to buy the food I need 0 No I eat alone most of the time 0 No I take three or more different prescribed or over-the-counter drugs a day 1 Yes Without wanting to, I have lost or gained 10 pounds in the last six months 0 No I am not always physically able to shop, cook and/or feed myself 0 No Nutrition Protocols Good Risk Protocol Moderate Risk Protocol 0 Provide education on nutrition High Risk Proctocol Risk Level: Moderate Risk Score: 3 Electronic Signature(s) Signed: 03/28/2021 5:42:26 PM By: Lorrin Jackson Entered By: Lorrin Jackson on 03/28/2021 08:09:09

## 2021-03-28 NOTE — Progress Notes (Signed)
[name], [name] ([id number]) Visit Report for 03/28/2021 Allergy List Details Patient Name: Date of Service: [name], [name] 03/28/2021 7:30 A M Medical Record Number: [id number] Patient Account Number: [id number] Date of Birth/Sex: Treating RN: 1965-10-10 (55 y.o. Male) Lorrin Jackson Primary Care Timmi Devora: [name] Other Clinician: Referring Joplin Canty: Treating Briel Gallicchio/Extender: [name] Weeks in Treatment: 0 Allergies Active Allergies penicillin Reaction: blisters in mouth Cymbalta Allergy Notes Electronic Signature(s) Signed: 03/28/2021 5:42:26 PM By: Lorrin Jackson Entered By: Lorrin Jackson on 03/28/2021 08:05:59 -------------------------------------------------------------------------------- Arrival Information Details Patient Name: Date of Service: [name]. 03/28/2021 7:30 A M Medical Record Number: [id number] Patient Account Number: [id number] Date of Birth/Sex: Treating RN: [birth year] (55 y.o. Male) Lorrin Jackson Primary Care Paeton Studer: [name] Other Clinician: Referring Enisa Runyan: Treating Tipton Ballow/Extender: [name] in Treatment: 0 Visit Information Patient Arrived: Wheel Chair Arrival Time: 07:59 Accompanied By: spouse Transfer Assistance: Manual Patient Identification Verified: Yes Secondary Verification Process Completed: Yes Patient Requires Transmission-Based Precautions: No Patient Has Alerts: Yes Patient Alerts: Patient on Blood Thinner No BP Right Arm-PICC Electronic Signature(s) Signed: 03/28/2021 5:42:26 PM By: Lorrin Jackson Entered By: Lorrin Jackson on 03/28/2021 08:00:01 -------------------------------------------------------------------------------- Clinic Level of Care Assessment Details Patient Name: Date of Service: [name], [name] 03/28/2021 7:30 A M Medical Record Number: [id number] Patient Account Number: [id number] Date of Birth/Sex: Treating RN: [birth year] (55 y.o. Male) Lorrin Jackson Primary Care Tala Eber: [name] Other Clinician: Referring Delmont Prosch: Treating Aziel Morgan/Extender: [name] Weeks in Treatment: 0 Clinic Level of Care Assessment Items TOOL 2 Quantity Score X- 1 0 Use when only an EandM is performed on the INITIAL visit ASSESSMENTS - Nursing Assessment / Reassessment X- 1 20 General Physical Exam (combine w/ comprehensive assessment (listed just below) when performed on new pt. evals) X- 1 25 Comprehensive Assessment (HX, ROS, Risk Assessments, Wounds Hx, etc.) ASSESSMENTS - Wound and Skin A ssessment / Reassessment X - Simple Wound Assessment / Reassessment - one wound 1 5 []  - 0 Complex Wound Assessment / Reassessment - multiple wounds []  - 0 Dermatologic / Skin Assessment (not related to wound area) ASSESSMENTS - Ostomy and/or Continence Assessment and Care []  - 0 Incontinence Assessment and Management []  - 0 Ostomy Care Assessment and Management (repouching, etc.) PROCESS - Coordination of Care []  - 0 Simple Patient / Family Education for ongoing care X- 1 20 Complex (extensive) Patient / Family Education for ongoing care X- 1 10 Staff obtains Programmer, systems, Records, T Results / Process Orders est []  - 0 Staff telephones HHA, Nursing Homes / Clarify orders / etc []  - 0 Routine Transfer to another Facility (non-emergent condition) []  - 0 Routine Hospital Admission (non-emergent condition) []  - 0 New Admissions / Biomedical engineer / Ordering NPWT Apligraf, etc. , []  - 0 Emergency Hospital Admission (emergent condition) []  - 0 Simple Discharge Coordination []  - 0 Complex (extensive) Discharge Coordination PROCESS - Special Needs []  - 0 Pediatric / Minor Patient Management []  - 0 Isolation Patient Management []  - 0 Hearing / Language / Visual special needs []  - 0 Assessment of Community assistance (transportation, D/C planning, etc.) []  - 0 Additional assistance / Altered mentation []  - 0 Support  Surface(s) Assessment (bed, cushion, seat, etc.) INTERVENTIONS - Wound Cleansing / Measurement X- 1 5 Wound Imaging (photographs - any number of wounds) []  - 0 Wound Tracing (instead of photographs) X- 1 5 Simple Wound Measurement - one wound []  - 0 Complex Wound Measurement - multiple wounds  X- 1 5 Simple Wound Cleansing - one wound []  - 0 Complex Wound Cleansing - multiple wounds INTERVENTIONS - Wound Dressings []  - 0 Small Wound Dressing one or multiple wounds []  - 0 Medium Wound Dressing one or multiple wounds X- 1 20 Large Wound Dressing one or multiple wounds []  - 0 Application of Medications - injection INTERVENTIONS - Miscellaneous []  - 0 External ear exam []  - 0 Specimen Collection (cultures, biopsies, blood, body fluids, etc.) []  - 0 Specimen(s) / Culture(s) sent or taken to Lab for analysis []  - 0 Patient Transfer (multiple staff / Harrel Lemon Lift / Similar devices) []  - 0 Simple Staple / Suture removal (25 or less) []  - 0 Complex Staple / Suture removal (26 or more) []  - 0 Hypo / Hyperglycemic Management (close monitor of Blood Glucose) []  - 0 Ankle / Brachial Index (ABI) - do not check if billed separately Has the patient been seen at the hospital within the last three years: Yes Total Score: 115 Level Of Care: New/Established - Level 3 Electronic Signature(s) Signed: 03/28/2021 5:42:26 PM By: Lorrin Jackson Entered By: Lorrin Jackson on 03/28/2021 09:18:02 -------------------------------------------------------------------------------- Encounter Discharge Information Details Patient Name: Date of Service: Christian Brown E. 03/28/2021 7:30 A M Medical Record Number: [id number] Patient Account Number: [id number] Date of Birth/Sex: Treating RN: 12-21-1965 (55 y.o. Male) Lorrin Jackson Primary Care Novalyn Lajara: [name] Other Clinician: Referring Lycia Sachdeva: Treating Jesusita Jocelyn/Extender: [name] Weeks in Treatment: 0 Encounter Discharge  Information Items Discharge Condition: Stable Ambulatory Status: Wheelchair Discharge Destination: Home Transportation: Private Auto Accompanied By: wife Schedule Follow-up Appointment: Yes Clinical Summary of Care: Provided on 03/28/2021 Form Type Recipient Paper Patient Patient Electronic Signature(s) Signed: 03/28/2021 5:42:26 PM By: Lorrin Jackson Entered By: Lorrin Jackson on 03/28/2021 09:19:26 -------------------------------------------------------------------------------- Lower Extremity Assessment Details Patient Name: Date of Service: Christian Brown E. 03/28/2021 7:30 A M Medical Record Number: [id number] Patient Account Number: [id number] Date of Birth/Sex: Treating RN: 1965/10/05 (55 y.o. Male) Lorrin Jackson Primary Care Keighley Deckman: [name] Other Clinician: Referring Nana Vastine: Treating Kerria Sapien/Extender: [name] Weeks in Treatment: 0 Notes N/A: Back Wound Electronic Signature(s) Signed: 03/28/2021 5:42:26 PM By: Lorrin Jackson Entered By: Lorrin Jackson on 03/28/2021 08:09:50 -------------------------------------------------------------------------------- Multi Wound Chart Details Patient Name: Date of Service: Christian Brown E. 03/28/2021 7:30 A M Medical Record Number: [id number] Patient Account Number: [id number] Date of Birth/Sex: Treating RN: [birth year] (55 y.o. Male) Primary Care Behr Cislo: [name] Other Clinician: Referring Carloyn Lahue: Treating Abrahim Sargent/Extender: [name] Weeks in Treatment: 0 Vital Signs Height(in): 74 Pulse(bpm): 123 Weight(lbs): Blood Pressure(mmHg): 96/60 Body Mass Index(BMI): Temperature(F): 98.0 Respiratory Rate(breaths/min): 20 Photos: [1:No Photos Midline Back] [N/A:N/A N/A] Wound Location: [1:Surgical Injury] [N/A:N/A] Wounding Event: [1:Open Surgical Wound] [N/A:N/A] Primary Etiology: [1:Hypertension, Type II Diabetes,] [N/A:N/A] Comorbid History: [1:Osteomyelitis 01/11/2021]  [N/A:N/A] Date Acquired: [1:0] [N/A:N/A] Weeks of Treatment: [1:Open] [N/A:N/A] Wound Status: [1:17.5x5.5x2.3] [N/A:N/A] Measurements L x W x D (cm) [1:75.595] [N/A:N/A] A (cm) : rea [1:173.868] [N/A:N/A] Volume (cm) : [1:12] Position 1 (o'clock): [1:4.6] Maximum Distance 1 (cm): [1:7] Position 2 (o'clock): [1:2.7] Maximum Distance 2 (cm): [1:Yes] [N/A:N/A] Tunneling: [1:Full Thickness Without Exposed] [N/A:N/A] Classification: [1:Support Structures Medium] [N/A:N/A] Exudate Amount: [1:Serosanguineous] [N/A:N/A] Exudate Type: [1:red, brown] [N/A:N/A] Exudate Color: [1:Well defined, not attached] [N/A:N/A] Wound Margin: [1:Large (67-100%)] [N/A:N/A] Granulation Amount: [1:Red, Pink] [N/A:N/A] Granulation Quality: [1:Small (1-33%)] [N/A:N/A] Necrotic Amount: [1:Fat Layer (Subcutaneous Tissue): Yes N/A] Exposed Structures: [1:Fascia: No Tendon: No Muscle: No Joint: No Bone: No None] [N/A:N/A] Epithelialization: [1:Negative  Pressure Wound Therapy] [N/A:N/A] Procedures Performed: [1:Application (NPWT)] Electronic Signature(s) Signed: 03/28/2021 9:27:22 AM By: Kalman Shan DO Entered By: Kalman Shan on 03/28/2021 08:58:29 -------------------------------------------------------------------------------- Multi-Disciplinary Care Plan Details Patient Name: Date of Service: Christian Brown E. 03/28/2021 7:30 A M Medical Record Number: [id number] Patient Account Number: [id number] Date of Birth/Sex: Treating RN: 20-Dec-1965 (55 y.o. Male) Lorrin Jackson Primary Care Kasha Howeth: [name] Other Clinician: Referring Katlen Seyer: Treating Sheril Hammond/Extender: [name] Weeks in Treatment: 0 Active Inactive Osteomyelitis Nursing Diagnoses: Infection: osteomyelitis Goals: Patient's osteomyelitis will resolve Date Initiated: 03/28/2021 Target Resolution Date: 04/25/2021 Goal Status: Active Interventions: Assess for signs and symptoms of osteomyelitis resolution every  visit Treatment Activities: MRI : 04/02/2021 Systemic antibiotics : 03/28/2021 Notes: Wound/Skin Impairment Nursing Diagnoses: Impaired tissue integrity Goals: Patient/caregiver will verbalize understanding of skin care regimen Date Initiated: 03/28/2021 Target Resolution Date: 04/25/2021 Goal Status: Active Ulcer/skin breakdown will have a volume reduction of 30% by week 4 Date Initiated: 03/28/2021 Target Resolution Date: 04/25/2021 Goal Status: Active Interventions: Assess patient/caregiver ability to obtain necessary supplies Assess patient/caregiver ability to perform ulcer/skin care regimen upon admission and as needed Assess ulceration(s) every visit Provide education on ulcer and skin care Treatment Activities: Topical wound management initiated : 03/28/2021 Notes: Electronic Signature(s) Signed: 03/28/2021 5:42:26 PM By: Lorrin Jackson Entered By: Lorrin Jackson on 03/28/2021 08:22:36 -------------------------------------------------------------------------------- Negative Pressure Wound Therapy Application (NPWT) Details Patient Name: Date of Service: [name], [name] 03/28/2021 7:30 A M Medical Record Number: [id number] Patient Account Number: [id number] Date of Birth/Sex: Treating RN: 24-Aug-1965 (55 y.o. Male) Lorrin Jackson Primary Care Reis Pienta: [name] Other Clinician: Referring Crystalina Stodghill: Treating Omeed Osuna/Extender: [name] Weeks in Treatment: 0 NPWT Application Performed for: Wound #1 Midline Back Performed By: Lorrin Jackson, RN Type: VAC System Coverage Size (sq cm): 96.25 Pressure Type: Constant Pressure Setting: 125 mmHG Drain Type: None Quantity of Sponges/Gauze Inserted: 3 Sponge/Dressing Type: Combination Combination: Black/White Date Initiated: 03/28/2021 Post Procedure Diagnosis Same as Pre-procedure Electronic Signature(s) Signed: 03/28/2021 5:42:26 PM By: Lorrin Jackson Entered By: Lorrin Jackson on 03/28/2021  08:39:22 -------------------------------------------------------------------------------- Pain Assessment Details Patient Name: Date of Service: Christian Brown E. 03/28/2021 7:30 A M Medical Record Number: [id number] Patient Account Number: [id number] Date of Birth/Sex: Treating RN: Apr 17, 1965 (55 y.o. Male) Lorrin Jackson Primary Care Monserrat Vidaurri: [name] Other Clinician: Referring Gwenlyn Hottinger: Treating Ayomide Zuleta/Extender: [name] Weeks in Treatment: 0 Active Problems Location of Pain Severity and Description of Pain Patient Has Paino Yes Site Locations Pain Location: Pain in Ulcers With Dressing Change: Yes Duration of the Pain. Constant / Intermittento Intermittent Rate the pain. Current Pain Level: 8 Character of Pain Describe the Pain: Tender, Throbbing Pain Management and Medication Current Pain Management: Medication: Yes Cold Application: No Rest: Yes Massage: No Activity: No T.MurphyN.S.: No Heat Application: No Leg drop or elevation: No Is the Current Pain Management Adequate: Adequate How does your wound impact your activities of daily livingo Sleep: Yes Bathing: No Appetite: Yes Relationship With Others: No Bladder Continence: No Emotions: No Bowel Continence: No Work: No Toileting: No Drive: No Dressing: No Hobbies: No Electronic Signature(s) Signed: 03/28/2021 5:42:26 PM By: Lorrin Jackson Entered By: Lorrin Jackson on 03/28/2021 08:21:07 -------------------------------------------------------------------------------- Patient/Caregiver Education Details Patient Name: Date of Service: [name] 12/15/2022andnbsp7:30 A M Medical Record Number: [id number] Patient Account Number: [id number] Date of Birth/Gender: Treating RN: Jan 06, 1966 (55 y.o. Male) Lorrin Jackson Primary Care Physician: [name] Other Clinician: Referring Physician: Treating Physician/Extender: [name] Weeks in  Treatment: 0 Education  Assessment Education Provided To: Patient Education Topics Provided Infection: Methods: Explain/Verbal, Printed Responses: State content correctly Wound/Skin Impairment: Methods: Demonstration, Explain/Verbal, Printed Responses: State content correctly Electronic Signature(s) Signed: 03/28/2021 5:42:26 PM By: Lorrin Jackson Entered By: Lorrin Jackson on 03/28/2021 08:23:02 -------------------------------------------------------------------------------- Wound Assessment Details Patient Name: Date of Service: [name]. 03/28/2021 7:30 A M Medical Record Number: [id number] Patient Account Number: [id number] Date of Birth/Sex: Treating RN: March 03, 1966 (55 y.o. Male) Lorrin Jackson Primary Care Dietrick Barris: Other Clinician: [name] Referring Alexsandro Salek: Treating Tyreon Frigon/Extender: [name] Weeks in Treatment: 0 Wound Status Wound Number: 1 Primary Etiology: Open Surgical Wound Wound Location: Midline Back Wound Status: Open Wounding Event: Surgical Injury Comorbid History: Hypertension, Type II Diabetes, Osteomyelitis Date Acquired: 01/11/2021 Weeks Of Treatment: 0 Clustered Wound: No Photos Photo Uploaded By: Donavan Burnet on 03/28/2021 15:51:51 Wound Measurements Length: (cm) 17.5 Width: (cm) 5.5 Depth: (cm) 2.3 Area: (cm) 75.595 Volume: (cm) 173.868 % Reduction in Area: 0% % Reduction in Volume: 0% Epithelialization: None Tunneling: Yes Position (o'clock): 12 Maximum Distance: (cm) 4.6 Undermining: Yes Starting Position (o'clock): 4 Ending Position (o'clock): 6 Maximum Distance: (cm) 2.7 Wound Description Classification: Full Thickness Without Exposed Support Structu Wound Margin: Well defined, not attached Exudate Amount: Medium Exudate Type: Serosanguineous Exudate Color: red, brown res Foul Odor After Cleansing: No Slough/Fibrino Yes Wound Bed Granulation Amount: Large (67-100%) Exposed Structure Granulation Quality: Red,  Pink Fascia Exposed: No Necrotic Amount: Small (1-33%) Fat Layer (Subcutaneous Tissue) Exposed: Yes Necrotic Quality: Adherent Slough Tendon Exposed: No Muscle Exposed: No Joint Exposed: No Bone Exposed: No Treatment Notes Wound #1 (Back) Wound Laterality: Midline Cleanser Peri-Wound Care Topical Primary Dressing Secondary Dressing Secured With Compression Wrap Compression Stockings Add-Ons Notes Wound vac applied per Allea Kassner order Electronic Signature(s) Signed: 03/28/2021 5:42:26 PM By: Lorrin Jackson Entered By: Lorrin Jackson on 03/28/2021 09:15:51 -------------------------------------------------------------------------------- Vitals Details Patient Name: Date of Service: Christian Brown E. 03/28/2021 7:30 A M Medical Record Number: [id number] Patient Account Number: [id number] Date of Birth/Sex: Treating RN: 1965-05-29 (55 y.o. Male) Lorrin Jackson Primary Care Lior Hoen: [name] Other Clinician: Referring William Schake: Treating Demiya Magno/Extender: [name] Weeks in Treatment: 0 Vital Signs Time Taken: 08:00 Temperature (F): 98.0 Height (in): 74 Pulse (bpm): 123 Source: Stated Respiratory Rate (breaths/min): 20 Blood Pressure (mmHg): 96/60 Reference Range: 80 - 120 mg / dl Electronic Signature(s) Signed: 03/28/2021 5:42:26 PM By: Lorrin Jackson Entered By: Lorrin Jackson on 03/28/2021 08:00:52

## 2021-03-29 ENCOUNTER — Other Ambulatory Visit (HOSPITAL_COMMUNITY): Payer: Self-pay

## 2021-04-01 ENCOUNTER — Ambulatory Visit: Payer: 59

## 2021-04-01 ENCOUNTER — Encounter (HOSPITAL_BASED_OUTPATIENT_CLINIC_OR_DEPARTMENT_OTHER): Payer: 59 | Admitting: Internal Medicine

## 2021-04-01 ENCOUNTER — Other Ambulatory Visit: Payer: Self-pay

## 2021-04-01 DIAGNOSIS — E11622 Type 2 diabetes mellitus with other skin ulcer: Secondary | ICD-10-CM | POA: Diagnosis not present

## 2021-04-01 NOTE — Progress Notes (Signed)
AWAB, ABEBE ([id number]) Visit Report for 04/01/2021 Arrival Information Details Patient Name: Date of Service: [name], [name] 04/01/2021 11:15 A M Medical Record Number: [id number] Patient Account Number: [id number] Date of Birth/Sex: Treating RN: February 14, 1966 (55 y.o. Ernestene Mention Primary Care Dimond Crotty: [name] Other Clinician: Referring Kamal Jurgens: Treating Cederic Mozley/Extender: [name] Weeks in Treatment: 0 Visit Information History Since Last Visit Added or deleted any medications: No Patient Arrived: Wheel Chair Any new allergies or adverse reactions: No Arrival Time: 11:33 Had a fall or experienced change in No Accompanied By: borther activities of daily living that may affect Transfer Assistance: None risk of falls: Patient Identification Verified: Yes Signs or symptoms of abuse/neglect since last visito No Secondary Verification Process Completed: Yes Hospitalized since last visit: No Patient Requires Transmission-Based Precautions: No Implantable device outside of the clinic excluding No Patient Has Alerts: Yes cellular tissue based products placed in the center Patient Alerts: Patient on Blood Thinner since last visit: No BP Right Arm-PICC Has Dressing in Place as Prescribed: Yes Pain Present Now: Yes Electronic Signature(s) Signed: 04/01/2021 12:08:12 PM By: Baruch Gouty RN, BSN Entered By: Baruch Gouty on 04/01/2021 11:34:42 -------------------------------------------------------------------------------- Encounter Discharge Information Details Patient Name: Date of Service: Christian Brown E. 04/01/2021 11:15 A M Medical Record Number: [id number] Patient Account Number: [id number] Date of Birth/Sex: Treating RN: [birth year] (55 y.o. Ernestene Mention Primary Care Neiman Roots: [name] Other Clinician: Referring Lorelie Biermann: Treating Merville Hijazi/Extender: [name] in Treatment: 0 Encounter Discharge Information  Items Discharge Condition: Stable Ambulatory Status: Wheelchair Discharge Destination: Home Transportation: Private Auto Accompanied By: brother Schedule Follow-up Appointment: Yes Clinical Summary of Care: Patient Declined Electronic Signature(s) Signed: 04/01/2021 12:08:12 PM By: Baruch Gouty RN, BSN Entered By: Baruch Gouty on 04/01/2021 12:07:22 -------------------------------------------------------------------------------- Negative Pressure Wound Therapy Maintenance (NPWT) Details Patient Name: Date of Service: [name], [name] 04/01/2021 11:15 A M Medical Record Number: [id number] Patient Account Number: [id number] Date of Birth/Sex: Treating RN: [birth year] (55 y.o. Ernestene Mention Primary Care Neriyah Cercone: [name] Other Clinician: Referring Teagan Ozawa: Treating Aison Malveaux/Extender: [name] Weeks in Treatment: 0 NPWT Maintenance Performed for: Wound #1 Midline Back Performed By: Baruch Gouty, RN Type: VAC System Coverage Size (sq cm): 96.25 Pressure Type: Constant Pressure Setting: 125 mmHG Drain Type: None Sponge/Dressing Type: Combination : white and black foam Date Initiated: 03/28/2021 Dressing Removed: No Quantity of Sponges/Gauze Removed: 3 Canister Changed: No Dressing Reapplied: No Quantity of Sponges/Gauze Inserted: 2 Respones T Treatment: o good Days On NPWT : 5 Electronic Signature(s) Signed: 04/01/2021 12:08:12 PM By: Baruch Gouty RN, BSN Entered By: Baruch Gouty on 04/01/2021 12:05:17 -------------------------------------------------------------------------------- Pain Assessment Details Patient Name: Date of Service: Christian Brown E. 04/01/2021 11:15 A M Medical Record Number: [id number] Patient Account Number: [id number] Date of Birth/Sex: Treating RN: December 17, 1965 (55 y.o. Ernestene Mention Primary Care Nasir Bright: [name] Other Clinician: Referring Amato Sevillano: Treating John Williamsen/Extender: [name] Weeks in Treatment: 0 Active Problems Location of Pain Severity and Description of Pain Patient Has Paino Yes Site Locations Pain Location: Pain in Ulcers With Dressing Change: Yes Duration of the Pain. Constant / Intermittento Constant Rate the pain. Current Pain Level: 8 Worst Pain Level: 10 Least Pain Level: 7 Character of Pain Describe the Pain: Aching Pain Management and Medication Current Pain Management: Medication: Yes Is the Current Pain Management Adequate: Adequate How does your wound impact your activities of daily livingo Sleep: Yes Bathing: No Appetite: No Relationship With Others:  No Bladder Continence: No Emotions: Yes Bowel Continence: No Work: No Toileting: No Drive: No Dressing: No Hobbies: No Electronic Signature(s) Signed: 04/01/2021 12:08:12 PM By: Baruch Gouty RN, BSN Entered By: Baruch Gouty on 04/01/2021 11:36:46 -------------------------------------------------------------------------------- Patient/Caregiver Education Details Patient Name: Date of Service: [name] 12/19/2022andnbsp11:15 A M Medical Record Number: [id number] Patient Account Number: [id number] Date of Birth/Gender: Treating RN: June 19, 1965 (55 y.o. Ernestene Mention Primary Care Physician: [name] Other Clinician: Referring Physician: Treating Physician/Extender: [name] in Treatment: 0 Education Assessment Education Provided To: Patient Education Topics Provided Wound/Skin Impairment: Methods: Explain/Verbal Responses: Reinforcements needed, State content correctly Electronic Signature(s) Signed: 04/01/2021 12:08:12 PM By: Baruch Gouty RN, BSN Entered By: Baruch Gouty on 04/01/2021 12:07:02 -------------------------------------------------------------------------------- Wound Assessment Details Patient Name: Date of Service: Christian Brown E. 04/01/2021 11:15 A M Medical Record Number: [id number] Patient  Account Number: [id number] Date of Birth/Sex: Treating RN: 1965-09-23 (55 y.o. Ernestene Mention Primary Care Lekeshia Kram: [name] Other Clinician: Referring Jo Booze: Treating Jerret Mcbane/Extender: [name] Weeks in Treatment: 0 Wound Status Wound Number: 1 Primary Etiology: Open Surgical Wound Wound Location: Midline Back Wound Status: Open Wounding Event: Surgical Injury Comorbid History: Hypertension, Type II Diabetes, Osteomyelitis Date Acquired: 01/11/2021 Weeks Of Treatment: 0 Clustered Wound: No Wound Measurements Length: (cm) 17.5 Width: (cm) 5.5 Depth: (cm) 2.3 Area: (cm) 75.595 Volume: (cm) 173.868 % Reduction in Area: 0% % Reduction in Volume: 0% Epithelialization: None Wound Description Classification: Full Thickness Without Exposed Support Structures Wound Margin: Well defined, not attached Exudate Amount: Medium Exudate Type: Serosanguineous Exudate Color: red, brown Foul Odor After Cleansing: No Slough/Fibrino Yes Wound Bed Granulation Amount: Large (67-100%) Exposed Structure Granulation Quality: Red, Pink Fascia Exposed: No Necrotic Amount: None Present (0%) Fat Layer (Subcutaneous Tissue) Exposed: Yes Tendon Exposed: No Muscle Exposed: No Joint Exposed: No Bone Exposed: No Treatment Notes Wound #1 (Back) Wound Laterality: Midline Cleanser Peri-Wound Care Topical Primary Dressing Secondary Dressing Secured With Compression Wrap Compression Stockings Add-Ons Notes VAC Electronic Signature(s) Signed: 04/01/2021 12:08:12 PM By: Baruch Gouty RN, BSN Entered By: Baruch Gouty on 04/01/2021 12:04:17 -------------------------------------------------------------------------------- Vitals Details Patient Name: Date of Service: Christian Brown E. 04/01/2021 11:15 A M Medical Record Number: [id number] Patient Account Number: [id number] Date of Birth/Sex: Treating RN: 04-13-1966 (55 y.o. Ernestene Mention Primary Care  Shaleen Talamantez: [name] Other Clinician: Referring Hodges Treiber: Treating Arisha Gervais/Extender: [name] Weeks in Treatment: 0 Vital Signs Time Taken: 11:34 Temperature (F): 97.8 Height (in): 74 Pulse (bpm): 105 Source: Stated Respiratory Rate (breaths/min): 20 Source: Stated Blood Pressure (mmHg): 124/89 Capillary Blood Glucose (mg/dl): 106 Reference Range: 80 - 120 mg / dl Notes glucose per pt report yesterday Electronic Signature(s) Signed: 04/01/2021 12:08:12 PM By: Baruch Gouty RN, BSN Entered By: Baruch Gouty on 04/01/2021 11:35:36

## 2021-04-01 NOTE — Progress Notes (Signed)
SUDAIS, BANGHART ([id number]) Visit Report for 04/01/2021 SuperBill Details Patient Name: Date of Service: [name], [name] 04/01/2021 Medical Record Number: [id number] Patient Account Number: [id number] Date of Birth/Sex: Treating RN: [birth year] (55 y.o. 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

## 2021-04-02 ENCOUNTER — Ambulatory Visit (HOSPITAL_COMMUNITY)
Admission: RE | Admit: 2021-04-02 | Discharge: 2021-04-02 | Disposition: A | Discharge status: Home / Self Care | Payer: 59 | Source: Ambulatory Visit | Attending: Neurological Surgery | Admitting: Neurological Surgery

## 2021-04-02 DIAGNOSIS — M5416 Radiculopathy, lumbar region: Secondary | ICD-10-CM | POA: Diagnosis not present

## 2021-04-02 IMAGING — CT CT L SPINE W/O CM
3 series · 12 of 33 positions shown, 14 images · non-contrast
Comparison: CT lumbar spine [DATE], and earlier.

Also recent lumbar MRI [DATE].

CLINICAL DATA: 55-year-old male stereotactic surgical planning.
Prior fusion with pseudoarthrosis, discitis osteomyelitis. Status
post debridement and wound VAC placement in [REDACTED].

EXAM:
CT LUMBAR SPINE WITHOUT CONTRAST
TECHNIQUE: Multidetector CT imaging of the lumbar spine was performed without
intravenous contrast administration. Multiplanar CT image
reconstructions were also generated.

[Series 4: l spine soft · axial · 0.46mm/px · z∈[-242,-54]mm · 4 of 138 slices shown, 5 images]
[im 22/138  soft-tissue]
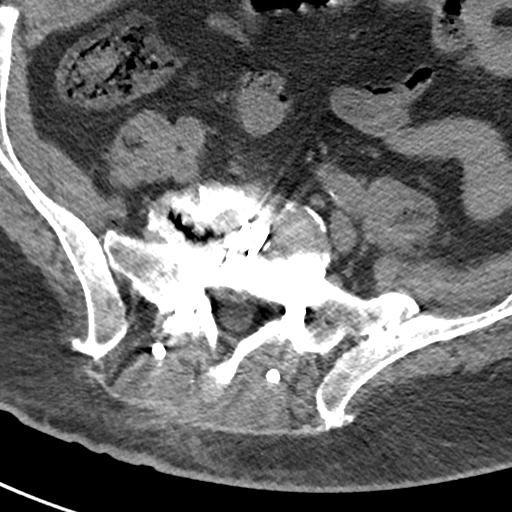
[im 22/138  bone]
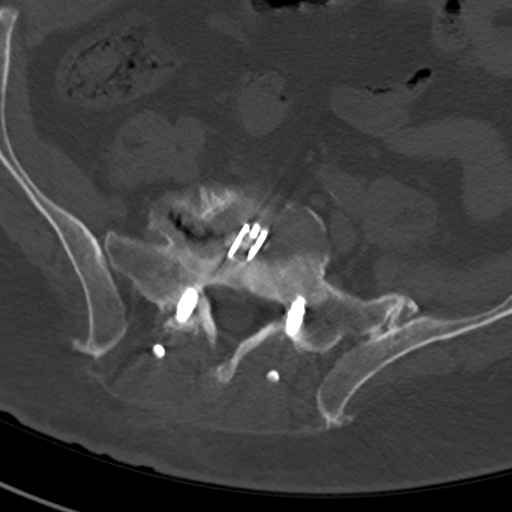
[im 53/138  bone]
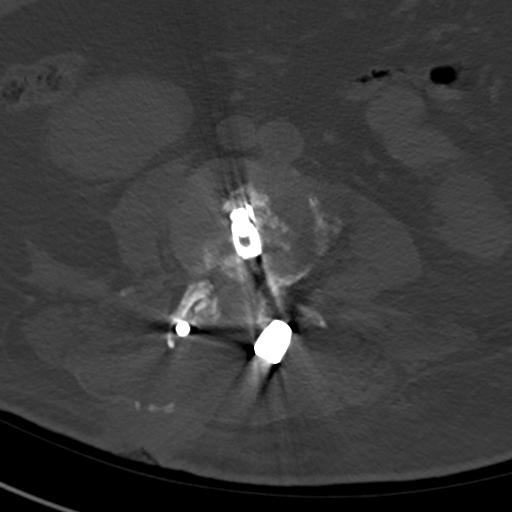
[im 85/138  bone]
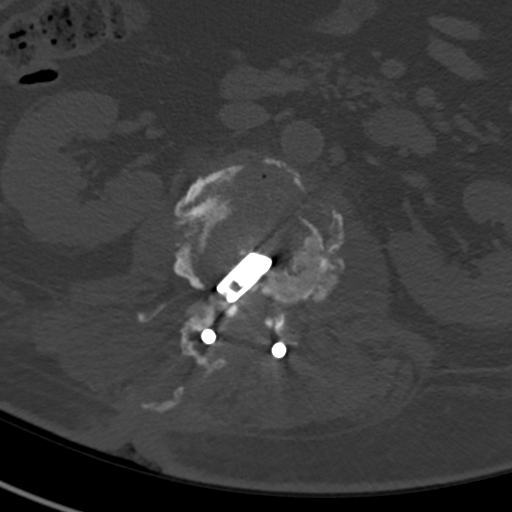
[im 116/138  bone]
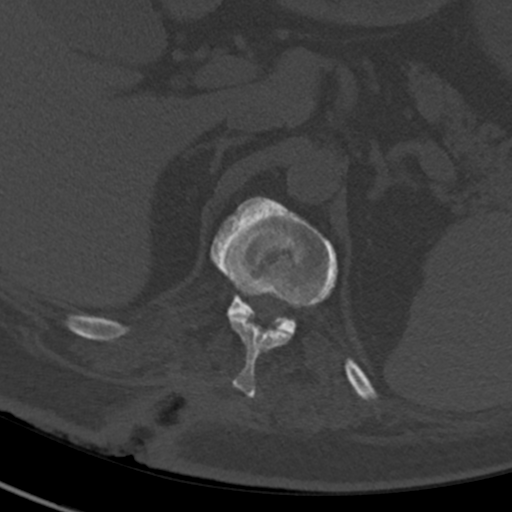

[Series 7: sagittal bone · sagittal · 0.40mm/px · 5 of 175 slices shown, 6 images]
[im 59/175  bone]
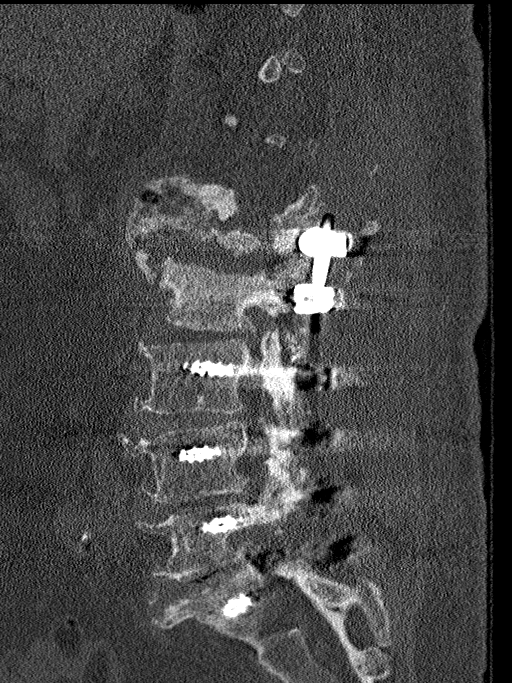
[im 73/175  bone]
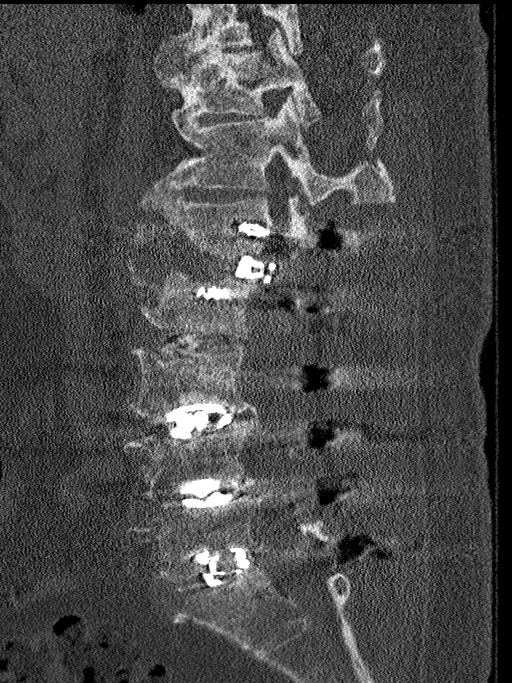
[im 88/175  soft-tissue]
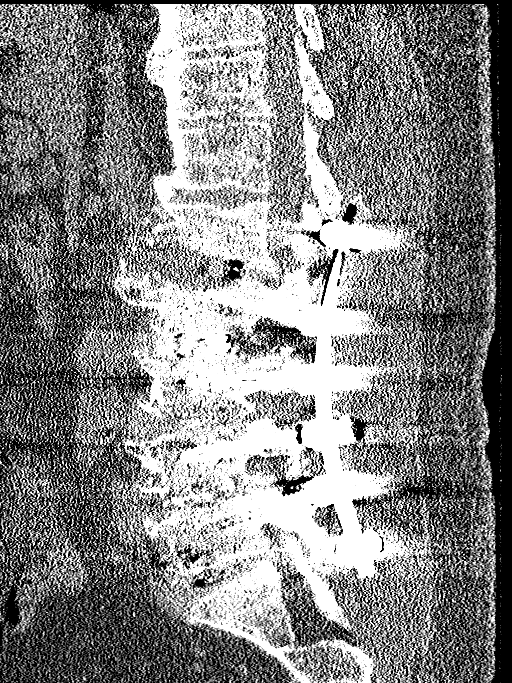
[im 88/175  bone]
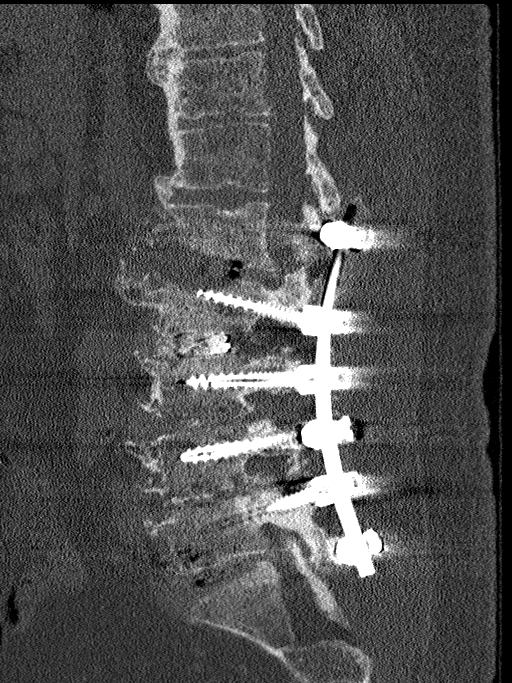
[im 102/175  bone]
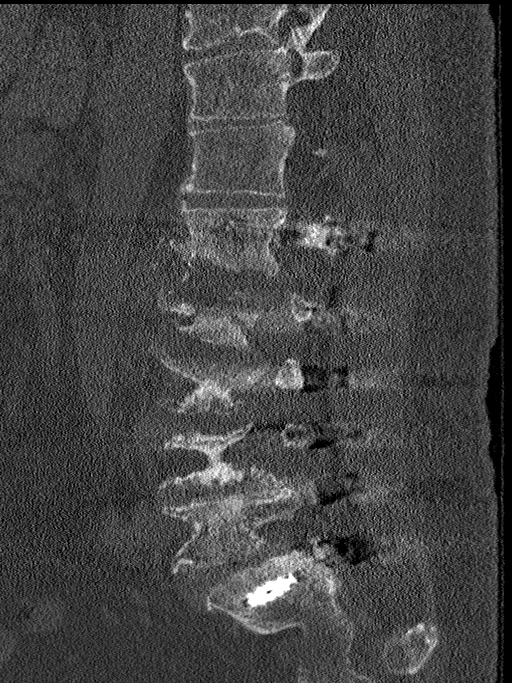
[im 117/175  bone]
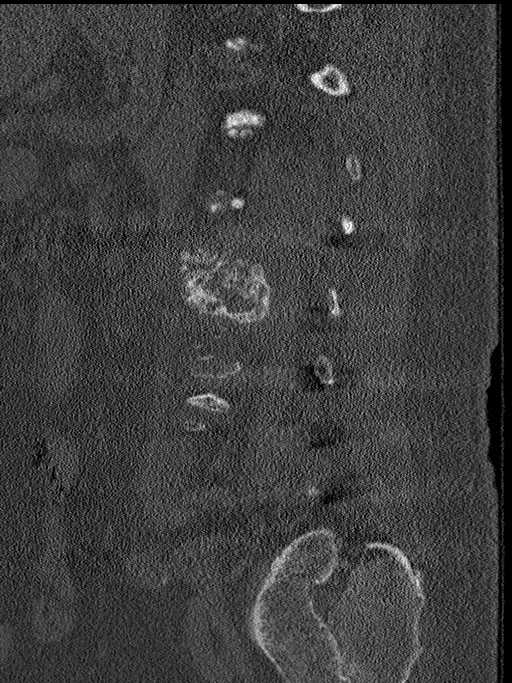

[Series 8: coronal bone · coronal · 0.40mm/px · 3 of 167 slices shown]
[im 34/167  bone]
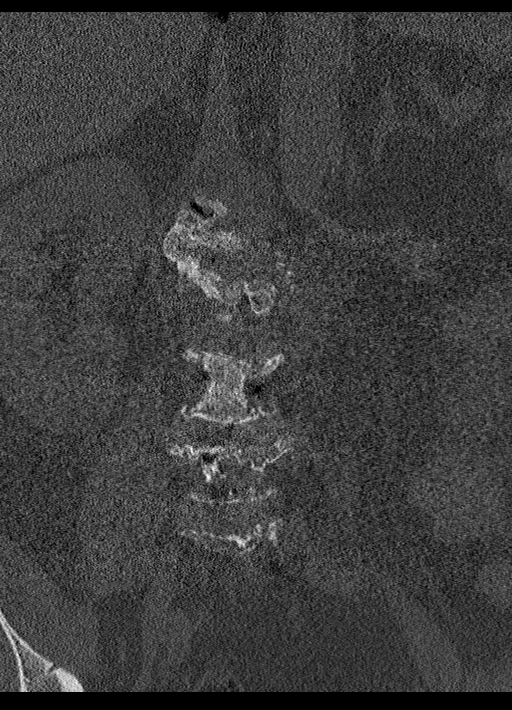
[im 67/167  bone]
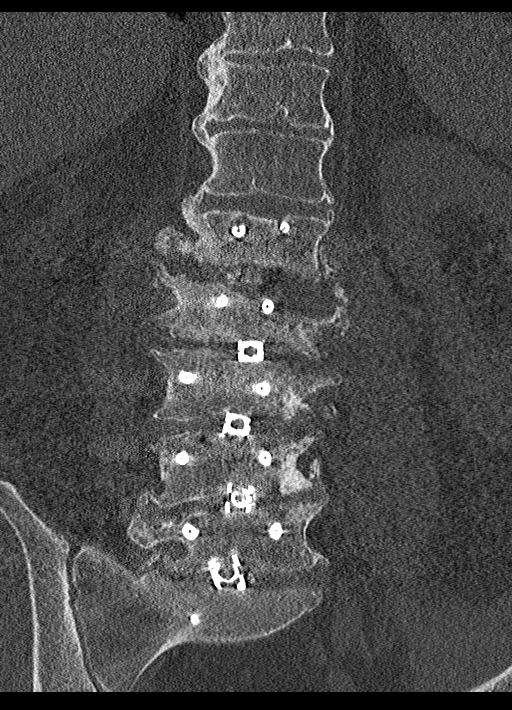
[im 100/167  bone]
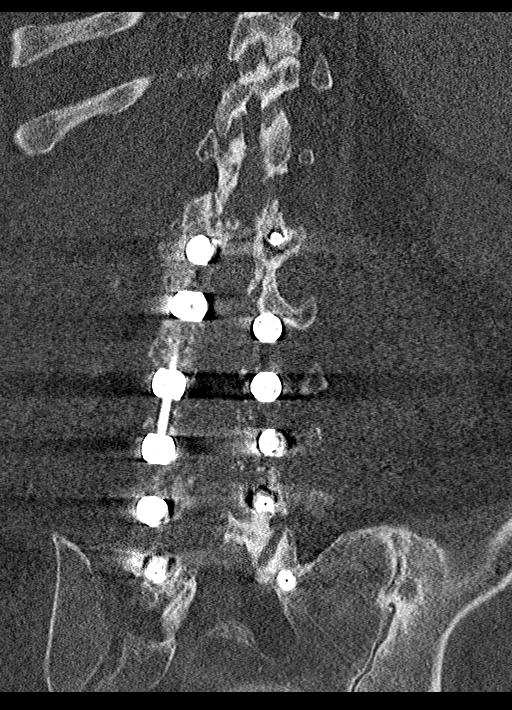

[12 of 33 positions shown; findings below may reference images not displayed]

FINDINGS: Segmentation: Normal, the same numbering system used on prior exams.

Alignment: Stable straightening of lumbar lordosis. No significant
scoliosis.

Vertebrae: Abnormal L1 and L2 endplates now, see details below.

No acute osseous abnormality in the visible lower thoracic spine
with interbody ankylosis through T12 related to flowing endplate
osteophytes.

See other lumbar postoperative details below.

Stable and intact visible sacrum and SI joints.

Paraspinal and other soft tissues: Abnormal paraspinal soft tissues
at L1-L2 with ongoing and perhaps increased prevertebral soft tissue
inflammation (series 4, image 54), but also PEPONE posterior
paraspinal skin thickening and possible tract formation to the
spinal hardware (series 4, image 55). See additional L1-L2
abnormality described below.

From there and increasing posterior soft tissue wound tracks
cephalad to the T11 vertebral level with wound packing on series 4,
image 22.

Visible noncontrast abdominal viscera remain negative. Normal
caliber abdominal aorta with mild Aortoiliac calcified
atherosclerosis.

Disc levels: Solid interbody arthrodesis is stable and the thoracic
spine through T12-L1.

T12-L1: Continued solid interbody arthrodesis via anterior endplate
osteophyte.

L1-L2: Progressive abnormality at this level. Pronounced loosening
of the right L1 pediclea screw with new surrounding lucency since
[REDACTED] (sagittal image 75) and erosion into the right L1 neural
foramen. Furthermore there is new retropulsion of the interbody
implant also into the proximal right L1 neural foramen. See sagittal
image 69. Previous posterior decompression.

Additionally, loosening of the left L1 pedicle screw with new
surrounding lucency which now communicates with the T12-L1 disc
space. See sagittal image 95.

Right L2 pedicle screws appear stable, including slight medial
positioning.

But the left anterior L2 pedicle screw now communicates with the
disc space by endplate erosion (sagittal image 90).

Progressive endplate erosion and abnormal expansion of the anterior
disc space with surrounding dystrophic calcification and paraspinal
inflammation (sagittal image 88 and series 4, image 54).

And progressive right paraspinal soft tissue wound extending to the
skin surface, with associated dystrophic calcification and further
detail above.

L2-L3: Stable positioning of L2-L3 interbody implant. Stable
posterior decompression. No convincing endplate erosion here since
[REDACTED]. But also no arthrodesis.

L3-L4: Stable decompression and fusion. Stable hardware. No
convincing arthrodesis.

L4-L5: Stable decompression and fusion. Stable hardware. No
convincing arthrodesis.

L5-S1: Stable decompression and fusion. Stable hardware including
some endplate subsidence. Bulky right lateral endplate osteophytosis
and residual facet hypertrophy but no convincing arthrodesis.
IMPRESSION: 1. L1-L2 discitis osteomyelitis with pronounced endplate erosion and
generalized hardware loosening since [REDACTED]. Retropulsion of the
interbody implant into the right lateral recess and proximal right
L1 neural foramen. Right L1 pedicle screw also partially eroded into
that foramen.

2. Generalized paraspinal soft tissue infection and inflammation
associated with #1, including a posterior superficial soft tissue
wound that tracks cephalad to the right T11 vertebral level.

3. Underlying lower thoracic interbody ankylosis which remains solid
through the T12-L1 anterior disc space.
The spinal fusion hardware L3 through S1 remains stable since
[REDACTED], but there is no convincing arthrodesis at those levels.

## 2021-04-04 ENCOUNTER — Other Ambulatory Visit: Payer: Self-pay

## 2021-04-04 ENCOUNTER — Encounter (HOSPITAL_BASED_OUTPATIENT_CLINIC_OR_DEPARTMENT_OTHER): Payer: 59 | Admitting: Internal Medicine

## 2021-04-04 DIAGNOSIS — E11622 Type 2 diabetes mellitus with other skin ulcer: Secondary | ICD-10-CM

## 2021-04-04 DIAGNOSIS — L98424 Non-pressure chronic ulcer of back with necrosis of bone: Secondary | ICD-10-CM

## 2021-04-04 DIAGNOSIS — M869 Osteomyelitis, unspecified: Secondary | ICD-10-CM | POA: Diagnosis not present

## 2021-04-06 ENCOUNTER — Encounter: Payer: Self-pay | Admitting: Physical Medicine and Rehabilitation

## 2021-04-09 ENCOUNTER — Other Ambulatory Visit: Payer: Self-pay

## 2021-04-09 ENCOUNTER — Encounter (HOSPITAL_BASED_OUTPATIENT_CLINIC_OR_DEPARTMENT_OTHER): Payer: 59 | Admitting: Internal Medicine

## 2021-04-09 DIAGNOSIS — E11622 Type 2 diabetes mellitus with other skin ulcer: Secondary | ICD-10-CM | POA: Diagnosis not present

## 2021-04-09 MED ORDER — CITALOPRAM HYDROBROMIDE 20 MG PO TABS: 20.0000 mg | ORAL_TABLET | Freq: Every day | ORAL | 5 refills | Status: DC

## 2021-04-09 MED ORDER — TAMSULOSIN HCL 0.4 MG PO CAPS: 0.8000 mg | ORAL_CAPSULE | Freq: Every day | ORAL | 5 refills | Status: DC

## 2021-04-09 MED ORDER — ASCORBIC ACID 500 MG PO TABS
500.0000 mg | ORAL_TABLET | Freq: Two times a day (BID) | ORAL | 3 refills | Status: AC
Start: 2021-04-09 — End: ?

## 2021-04-09 NOTE — Progress Notes (Signed)
VALIANT, DILLS ([id number]) Visit Report for 04/09/2021 Arrival Information Details Patient Name: Date of Service: [name], [name] 04/09/2021 11:15 A M Medical Record Number: [id number] Patient Account Number: [id number] Date of Birth/Sex: Treating RN: 08/22/1965 (55 y.o. [name] Primary Care Korde Jeppsen: [name] Other Clinician: Referring Dylon Correa: Treating Alyza Artiaga/Extender: [name] Weeks in Treatment: 1 Visit Information History Since Last Visit Added or deleted any medications: No Patient Arrived: Wheel Chair Any new allergies or adverse reactions: No Arrival Time: 11:15 Had a fall or experienced change in No Accompanied By: self activities of daily living that may affect Transfer Assistance: Manual risk of falls: Patient Identification Verified: Yes Signs or symptoms of abuse/neglect since last visito No Secondary Verification Process Completed: Yes Hospitalized since last visit: No Patient Requires Transmission-Based Precautions: No Implantable device outside of the clinic excluding No Patient Has Alerts: Yes cellular tissue based products placed in the center Patient Alerts: Patient on Blood Thinner since last visit: No BP Right Arm-PICC Has Dressing in Place as Prescribed: Yes Pain Present Now: No Electronic Signature(s) Signed: 04/09/2021 1:23:05 PM By: Deon Pilling RN, BSN Entered By: Deon Pilling on 04/09/2021 12:44:17 -------------------------------------------------------------------------------- Encounter Discharge Information Details Patient Name: Date of Service: Christian Brown E. 04/09/2021 11:15 A M Medical Record Number: [id number] Patient Account Number: [id number] Date of Birth/Sex: Treating RN: 30-Apr-1965 (55 y.o. [name] Primary Care Dillion Stowers: [name] Other Clinician: Referring Sachiko Methot: Treating Eular Panek/Extender: [name] in Treatment: 1 Encounter Discharge Information Items Discharge  Condition: Stable Ambulatory Status: Wheelchair Discharge Destination: Home Transportation: Private Auto Accompanied By: friend Schedule Follow-up Appointment: Yes Clinical Summary of Care: Electronic Signature(s) Signed: 04/09/2021 1:23:05 PM By: Deon Pilling RN, BSN Entered By: Deon Pilling on 04/09/2021 12:50:43 -------------------------------------------------------------------------------- Negative Pressure Wound Therapy Maintenance (NPWT) Details Patient Name: Date of Service: [name] 04/09/2021 11:15 A M Medical Record Number: [id number] Patient Account Number: [id number] Date of Birth/Sex: Treating RN: 02/15/1966 (55 y.o. [name] Primary Care Elgie Maziarz: [name] Other Clinician: Referring Rochester Serpe: Treating Donnavin Vandenbrink/Extender: [name] Weeks in Treatment: 1 NPWT Maintenance Performed for: Wound #1 Midline Back Performed By: Deon Pilling, RN Type: VAC System Coverage Size (sq cm): 82.16 Pressure Type: Constant Pressure Setting: 125 mmHG Drain Type: None Primary Contact: Non-Adherent Sponge/Dressing Type: Foam, White Date Initiated: 03/28/2021 Dressing Removed: Yes Quantity of Sponges/Gauze Removed: x2 white foam and x2 black foam Canister Changed: No Canister Exudate Volume: 29 Dressing Reapplied: Yes Quantity of Sponges/Gauze Inserted: x2 white foam and x2 black foam bridging to right Respones T Treatment: o tolerated well Days On NPWT : 13 Notes x2 white foam packing lightly into tunnel and undermining also along the depth of the wound. x2 black foam bridging to right side of back. Electronic Signature(s) Signed: 04/09/2021 1:23:05 PM By: Deon Pilling RN, BSN Entered By: Deon Pilling on 04/09/2021 28:78:67 -------------------------------------------------------------------------------- Patient/Caregiver Education Details Patient Name: Date of Service: [name] 12/27/2022andnbsp11:15 A M Medical Record Number:  [id number] Patient Account Number: [id number] Date of Birth/Gender: Treating RN: 07-Jun-1965 (55 y.o. [name] Primary Care Physician: [name] Other Clinician: Referring Physician: Treating Physician/Extender: [name] in Treatment: 1 Education Assessment Education Provided To: Patient Education Topics Provided Wound/Skin Impairment: Handouts: Skin Care Do's and Dont's Methods: Explain/Verbal Responses: Reinforcements needed Electronic Signature(s) Signed: 04/09/2021 1:23:05 PM By: Deon Pilling RN, BSN Entered By: Deon Pilling on 04/09/2021 12:50:25 -------------------------------------------------------------------------------- Wound Assessment Details Patient Name: Date of  Service: [name] 04/09/2021 11:15 A M Medical Record Number: [id number] Patient Account Number: [id number] Date of Birth/Sex: Treating RN: 01-Feb-1966 (55 y.o. [name] Primary Care Kiefer Opheim: [name] Other Clinician: Referring Horris Speros: Treating Chaise Passarella/Extender: [name] Weeks in Treatment: 1 Wound Status Wound Number: 1 Primary Etiology: Open Surgical Wound Wound Location: Midline Back Wound Status: Open Wounding Event: Surgical Injury Date Acquired: 01/11/2021 Weeks Of Treatment: 1 Clustered Wound: No Wound Measurements Length: (cm) 15.8 Width: (cm) 5.2 Depth: (cm) 2.1 Area: (cm) 64.528 Volume: (cm) 135.509 % Reduction in Area: 14.6% % Reduction in Volume: 22.1% Wound Description Classification: Full Thickness Without Exposed Support Structu Exudate Amount: Large Exudate Type: Serosanguineous Exudate Color: red, brown res Treatment Notes Wound #1 (Back) Wound Laterality: Midline Cleanser Peri-Wound Care Skin Prep Discharge Instruction: Use skin prep as directed Topical Primary Dressing wound vac with white and black foam Secondary Dressing Secured With Compression Wrap Compression  Stockings Add-Ons Electronic Signature(s) Signed: 04/09/2021 1:23:05 PM By: Deon Pilling RN, BSN Entered By: Deon Pilling on 04/09/2021 12:47:17 -------------------------------------------------------------------------------- Vitals Details Patient Name: Date of Service: Christian Brown E. 04/09/2021 11:15 A M Medical Record Number: [id number] Patient Account Number: [id number] Date of Birth/Sex: Treating RN: [birth year] (55 y.o. [name] Primary Care Jalani Rominger: [name] Other Clinician: Referring Mirenda Baltazar: Treating Genowefa Morga/Extender: [name] Weeks in Treatment: 1 Vital Signs Time Taken: 11:15 Temperature (F): 97.6 Height (in): 74 Pulse (bpm): 101 Respiratory Rate (breaths/min): 20 Blood Pressure (mmHg): 90/66 Reference Range: 80 - 120 mg / dl Notes Per patient at times BP drops like this. Patient denies any dizziness, lightheadness, or feeling faint. Explained to patient to keep a close eye and monitor BP at home continues to remain low to see PCP. Patient in agreement. Electronic Signature(s) Signed: 04/09/2021 1:23:05 PM By: Deon Pilling RN, BSN Entered By: Deon Pilling on 04/09/2021 12:45:56

## 2021-04-10 NOTE — Progress Notes (Signed)
[name] ([id number]) Visit Report for 04/04/2021 Arrival Information Details Patient Name: Date of Service: [name], [name] 04/04/2021 9:15 A M Medical Record Number: [id number] Patient Account Number: [id number] Date of Birth/Sex: Treating RN: [birth year] (55 y.o. [name] Primary Care Jadi Deyarmin: [name] Other Clinician: Referring Terea Neubauer: Treating Diaz Crago/Extender: [name] Weeks in Treatment: 1 Visit Information History Since Last Visit Added or deleted any medications: No Patient Arrived: Wheel Chair Any new allergies or adverse reactions: No Arrival Time: 09:50 Had a fall or experienced change in No Accompanied By: brother activities of daily living that may affect Transfer Assistance: None risk of falls: Patient Requires Transmission-Based Precautions: No Signs or symptoms of abuse/neglect since last visito No Patient Has Alerts: Yes Hospitalized since last visit: No Patient Alerts: Patient on Blood Thinner Implantable device outside of the clinic excluding No No BP Right Arm-PICC cellular tissue based products placed in the center since last visit: Has Dressing in Place as Prescribed: Yes Pain Present Now: Yes Electronic Signature(s) Signed: 04/10/2021 4:57:51 PM By: Dellie Catholic RN Entered By: Dellie Catholic on 04/04/2021 09:51:16 -------------------------------------------------------------------------------- Encounter Discharge Information Details Patient Name: Date of Service: Christian Brown E. 04/04/2021 9:15 A M Medical Record Number: [id number] Patient Account Number: [id number] Date of Birth/Sex: Treating RN: 29-Jun-1965 (55 y.o. [name] Primary Care Nakesha Ebrahim: [name] Other Clinician: Referring Derhonda Eastlick: Treating Ahamed Hofland/Extender: [name] in Treatment: 1 Encounter Discharge Information Items Discharge Condition: Stable Ambulatory Status: Wheelchair Discharge Destination:  Home Transportation: Other Accompanied By: son Schedule Follow-up Appointment: Yes Clinical Summary of Care: Patient Declined Electronic Signature(s) Signed: 04/04/2021 4:57:12 PM By: Levan Hurst RN, BSN Entered By: Levan Hurst on 04/04/2021 16:28:02 -------------------------------------------------------------------------------- Lower Extremity Assessment Details Patient Name: Date of Service: Christian Brown E. 04/04/2021 9:15 A M Medical Record Number: [id number] Patient Account Number: [id number] Date of Birth/Sex: Treating RN: Nov 24, 1965 (55 y.o. [name] Primary Care Tashona Calk: [name] Other Clinician: Referring Kamyrah Feeser: Treating Tomothy Eddins/Extender: [name] Weeks in Treatment: 1 Electronic Signature(s) Signed: 04/10/2021 4:57:51 PM By: Dellie Catholic RN Entered By: Dellie Catholic on 04/04/2021 09:53:19 -------------------------------------------------------------------------------- Multi Wound Chart Details Patient Name: Date of Service: Christian Brown E. 04/04/2021 9:15 A M Medical Record Number: [id number] Patient Account Number: [id number] Date of Birth/Sex: Treating RN: 07/23/1965 (55 y.o. [name] Primary Care Queen Abbett: [name] Other Clinician: Referring Tanesia Butner: Treating Cedra Villalon/Extender: [name] Weeks in Treatment: 1 Vital Signs Height(in): 74 Pulse(bpm): 120 Weight(lbs): Blood Pressure(mmHg): 121/79 Body Mass Index(BMI): Temperature(F): 98.5 Respiratory Rate(breaths/min): 20 Photos: [N/A:N/A] Midline Back N/A N/A Wound Location: Surgical Injury N/A N/A Wounding Event: Open Surgical Wound N/A N/A Primary Etiology: Hypertension, Type II Diabetes, N/A N/A Comorbid History: Osteomyelitis 01/11/2021 N/A N/A Date Acquired: 1 N/A N/A Weeks of Treatment: Open N/A N/A Wound Status: 15.8x5.2x2.1 N/A N/A Measurements L x W x D (cm) 64.528 N/A N/A A (cm) : rea 135.509 N/A N/A Volume  (cm) : 14.60% N/A N/A % Reduction in A rea: 22.10% N/A N/A % Reduction in Volume: 11 Starting Position 1 (o'clock): 1 Ending Position 1 (o'clock): 4.2 Maximum Distance 1 (cm): Yes N/A N/A Undermining: Full Thickness Without Exposed N/A N/A Classification: Support Structures Large N/A N/A Exudate Amount: Serosanguineous N/A N/A Exudate Type: red, brown N/A N/A Exudate Color: Well defined, not attached N/A N/A Wound Margin: Large (67-100%) N/A N/A Granulation Amount: Red, Hyper-granulation N/A N/A Granulation Quality: None Present (0%) N/A N/A Necrotic Amount: Fat Layer (  Subcutaneous Tissue): Yes N/A N/A Exposed Structures: Fascia: No Tendon: No Muscle: No Joint: No Bone: No None N/A N/A Epithelialization: Treatment Notes Electronic Signature(s) Signed: 04/04/2021 10:28:31 AM By: Kalman Shan DO Signed: 04/04/2021 4:57:12 PM By: Levan Hurst RN, BSN Entered By: Kalman Shan on 04/04/2021 10:25:24 -------------------------------------------------------------------------------- Multi-Disciplinary Care Plan Details Patient Name: Date of Service: Christian Brown E. 04/04/2021 9:15 A M Medical Record Number: [id number] Patient Account Number: [id number] Date of Birth/Sex: Treating RN: [birth year] (55 y.o. [name] Primary Care Tannis Burstein: [name] Other Clinician: Referring Arris Meyn: Treating Jesi Jurgens/Extender: [name] Weeks in Treatment: 1 Active Inactive Osteomyelitis Nursing Diagnoses: Infection: osteomyelitis Goals: Patient's osteomyelitis will resolve Date Initiated: 03/28/2021 Target Resolution Date: 04/25/2021 Goal Status: Active Interventions: Assess for signs and symptoms of osteomyelitis resolution every visit Treatment Activities: MRI : 04/02/2021 Systemic antibiotics : 03/28/2021 Notes: Wound/Skin Impairment Nursing Diagnoses: Impaired tissue integrity Goals: Patient/caregiver will verbalize  understanding of skin care regimen Date Initiated: 03/28/2021 Target Resolution Date: 04/25/2021 Goal Status: Active Ulcer/skin breakdown will have a volume reduction of 30% by week 4 Date Initiated: 03/28/2021 Target Resolution Date: 04/25/2021 Goal Status: Active Interventions: Assess patient/caregiver ability to obtain necessary supplies Assess patient/caregiver ability to perform ulcer/skin care regimen upon admission and as needed Assess ulceration(s) every visit Provide education on ulcer and skin care Treatment Activities: Topical wound management initiated : 03/28/2021 Notes: Electronic Signature(s) Signed: 04/04/2021 4:57:12 PM By: Levan Hurst RN, BSN Entered By: Levan Hurst on 04/04/2021 16:26:53 -------------------------------------------------------------------------------- Negative Pressure Wound Therapy Maintenance (NPWT) Details Patient Name: Date of Service: LANGFORD, CARIAS 04/04/2021 9:15 A M Medical Record Number: [id number] Patient Account Number: [id number] Date of Birth/Sex: Treating RN: 1965/12/30 (55 y.o. [name] Primary Care Ziona Wickens: [name] Other Clinician: Referring Lindley Hiney: Treating Anarosa Kubisiak/Extender: [name] Weeks in Treatment: 1 NPWT Maintenance Performed for: Wound #1 Midline Back Performed By: Levan Hurst, RN Type: VAC System Coverage Size (sq cm): 82.16 Pressure Type: Constant Pressure Setting: 125 mmHG Drain Type: None Sponge/Dressing Type: Combination : Black and white foam Date Initiated: 03/28/2021 Dressing Removed: Yes Canister Changed: No Dressing Reapplied: Yes Quantity of Sponges/Gauze Inserted: 2 pieces of white foam, 2 pieces black foam Respones T Treatment: o pt tolerated well Days On NPWT : 8 Post Procedure Diagnosis Same as Pre-procedure Electronic Signature(s) Signed: 04/04/2021 4:57:12 PM By: Levan Hurst RN, BSN Entered By: Levan Hurst on 04/04/2021  10:40:52 -------------------------------------------------------------------------------- Pain Assessment Details Patient Name: Date of Service: Christian Brown E. 04/04/2021 9:15 A M Medical Record Number: [id number] Patient Account Number: [id number] Date of Birth/Sex: Treating RN: 1965-06-15 (55 y.o. [name] Primary Care Luqman Perrelli: [name] Other Clinician: Referring Chay Mazzoni: Treating Rumaysa Sabatino/Extender: [name] Weeks in Treatment: 1 Active Problems Location of Pain Severity and Description of Pain Patient Has Paino Yes Site Locations Rate the pain. Rate the pain. Current Pain Level: 8 Worst Pain Level: 10 Least Pain Level: 8 Tolerable Pain Level: 8 Character of Pain Describe the Pain: Difficult to Pinpoint Pain Management and Medication Current Pain Management: Medication: Yes Cold Application: No Rest: No Massage: No Activity: No T.MurphyN.S.: No Heat Application: No Leg drop or elevation: No Is the Current Pain Management Adequate: Adequate How does your wound impact your activities of daily livingo Sleep: No Bathing: No Appetite: No Relationship With Others: No Bladder Continence: No Emotions: No Bowel Continence: No Work: No Toileting: No Drive: No Dressing: No Hobbies: No Electronic Signature(s) Signed: 04/10/2021 4:57:51 PM By: Dellie Catholic  RN Entered By: Dellie Catholic on 04/04/2021 09:53:12 -------------------------------------------------------------------------------- Patient/Caregiver Education Details Patient Name: Date of Service: NIJAH, ORLICH 12/22/2022andnbsp9:15 A M Medical Record Number: [id number] Patient Account Number: [id number] Date of Birth/Gender: Treating RN: April 20, 1965 (55 y.o. [name] Primary Care Physician: [name] Other Clinician: Referring Physician: Treating Physician/Extender: [name] in Treatment: 1 Education Assessment Education Provided  To: Patient Education Topics Provided Wound/Skin Impairment: Methods: Explain/Verbal Responses: State content correctly Electronic Signature(s) Signed: 04/04/2021 4:57:12 PM By: Levan Hurst RN, BSN Entered By: Levan Hurst on 04/04/2021 16:27:11 -------------------------------------------------------------------------------- Wound Assessment Details Patient Name: Date of Service: Christian Brown E. 04/04/2021 9:15 A M Medical Record Number: [id number] Patient Account Number: [id number] Date of Birth/Sex: Treating RN: [birth year] (55 y.o. [name] Primary Care Trenae Brunke: [name] Other Clinician: Referring Arin Vanosdol: Treating Olander Friedl/Extender: [name] Weeks in Treatment: 1 Wound Status Wound Number: 1 Primary Etiology: Open Surgical Wound Wound Location: Midline Back Wound Status: Open Wounding Event: Surgical Injury Comorbid History: Hypertension, Type II Diabetes, Osteomyelitis Date Acquired: 01/11/2021 Weeks Of Treatment: 1 Clustered Wound: No Photos Wound Measurements Length: (cm) 15.8 Width: (cm) 5.2 Depth: (cm) 2.1 Area: (cm) 64.528 Volume: (cm) 135.509 % Reduction in Area: 14.6% % Reduction in Volume: 22.1% Epithelialization: None Tunneling: No Undermining: Yes Starting Position (o'clock): 11 Ending Position (o'clock): 1 Maximum Distance: (cm) 4.2 Wound Description Classification: Full Thickness Without Exposed Support Structures Wound Margin: Well defined, not attached Exudate Amount: Large Exudate Type: Serosanguineous Exudate Color: red, brown Foul Odor After Cleansing: No Slough/Fibrino No Wound Bed Granulation Amount: Large (67-100%) Exposed Structure Granulation Quality: Red, Hyper-granulation Fascia Exposed: No Necrotic Amount: None Present (0%) Fat Layer (Subcutaneous Tissue) Exposed: Yes Tendon Exposed: No Muscle Exposed: No Joint Exposed: No Bone Exposed: No Electronic Signature(s) Signed: 04/04/2021  10:42:29 AM By: Sandre Kitty Signed: 04/10/2021 4:57:51 PM By: Dellie Catholic RN Entered By: Sandre Kitty on 04/04/2021 10:08:04 -------------------------------------------------------------------------------- Vitals Details Patient Name: Date of Service: Christian Brown E. 04/04/2021 9:15 A M Medical Record Number: [id number] Patient Account Number: [id number] Date of Birth/Sex: Treating RN: [birth year] (55 y.o. [name] Primary Care Yosmar Ryker: [name] Other Clinician: Referring Osamah Schmader: Treating Cassandre Oleksy/Extender: [name] Weeks in Treatment: 1 Vital Signs Time Taken: 09:51 Temperature (F): 98.5 Height (in): 74 Pulse (bpm): 120 Respiratory Rate (breaths/min): 20 Blood Pressure (mmHg): 121/79 Reference Range: 80 - 120 mg / dl Electronic Signature(s) Signed: 04/10/2021 4:57:51 PM By: Dellie Catholic RN Entered By: Dellie Catholic on 04/04/2021 09:52:29

## 2021-04-11 ENCOUNTER — Telehealth: Payer: Self-pay

## 2021-04-11 ENCOUNTER — Other Ambulatory Visit: Payer: Self-pay

## 2021-04-11 ENCOUNTER — Ambulatory Visit (INDEPENDENT_AMBULATORY_CARE_PROVIDER_SITE_OTHER): Payer: 59 | Admitting: Infectious Diseases

## 2021-04-11 VITALS — BP 136/86 | HR 115 | Temp 98.1°F

## 2021-04-11 DIAGNOSIS — T847XXD Infection and inflammatory reaction due to other internal orthopedic prosthetic devices, implants and grafts, subsequent encounter: Secondary | ICD-10-CM

## 2021-04-11 DIAGNOSIS — T84498A Other mechanical complication of other internal orthopedic devices, implants and grafts, initial encounter: Secondary | ICD-10-CM | POA: Diagnosis not present

## 2021-04-11 DIAGNOSIS — Z5181 Encounter for therapeutic drug level monitoring: Secondary | ICD-10-CM | POA: Diagnosis not present

## 2021-04-11 NOTE — Progress Notes (Signed)
[name], [name] ([id number]) Visit Report for 04/09/2021 SuperBill Details Patient Name: Date of Service: [name], [name] 04/09/2021 Medical Record Number: [id number] Patient Account Number: [id number] Date of Birth/Sex: Treating RN: 1965/06/14 (55 y.o. 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

## 2021-04-11 NOTE — Telephone Encounter (Signed)
Per MD called [facility] with orders to change patient antibiotics. Spoke with Jeani Hawking, Pharmacist who was able to take verbal order.  Patient has been on antibiotics before. Is aware of medication change. [name], RMA

## 2021-04-11 NOTE — Telephone Encounter (Signed)
Received confirmation from August Saucer with [facility] that orders were received. [name], RMA

## 2021-04-11 NOTE — Telephone Encounter (Signed)
-----   Message from Rosiland Oz, MD sent at 04/11/2021 12:56 PM EST ----- Regarding: Change in antibiotics Hi Team,   Please let Home health know that I want to change his antibiotic from cefazolin ( current) to ceftriaxone 2g IV daily. Duration - to be continued until next fu appt with me.   Thanks

## 2021-04-11 NOTE — Progress Notes (Signed)
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   Klebsiella pneumoniae infection 01/28/2021   Wound infection after surgery 01/25/2021   Muscle spasms of both lower extremities 01/25/2021   Abdominal pain 01/25/2021   Spleen enlarged 01/25/2021   Lumbar radiculopathy 01/11/2021   Spasticity 12/03/2020   Neuropathy 09/28/2020   Chronic pain syndrome 09/28/2020   Intervertebral lumbar disc disorder with myelopathy, lumbar region 09/28/2020   Spondylosis, cervical, with myelopathy 09/28/2020   Unilateral primary osteoarthritis, right knee 10/28/2019   PCP NOTES >>>>>>>> 07/09/2015   DJD (degenerative joint disease) 10/27/2014   Screening for heart disease 06/28/2012   OSA (obstructive sleep apnea) 06/14/2012   ED (erectile dysfunction) 04/26/2012   Annual physical exam 12/29/2011   Achilles tendinitis 09/10/2010   Allergic rhinitis 07/12/2010   Hyperlipidemia 04/26/2007   DM II (diabetes mellitus, type II), controlled ([place]) 04/22/2006   OBESITY NOS 04/22/2006   Essential hypertension 04/22/2006    Patient's Medications  New Prescriptions   No medications on file  Previous Medications   ASCORBIC ACID (VITAMIN C) 500 MG TABLET    Take 1 tablet (500 mg total) by mouth 2 (two) times daily.   BLOOD GLUCOSE METER KIT AND SUPPLIES    Use up to four times daily as directed. Dx: E11.9   BLOOD GLUCOSE MONITORING SUPPL (ONETOUCH VERIO FLEX SYSTEM) W/DEVICE KIT    Test blood sugar twice a day.  Dx code: E11.9   CEFAZOLIN (ANCEF) IVPB    Inject 2 g into the vein every 8 (eight) hours. Indication:   bacteremia/diskitis First Dose: No Last Day of Therapy:  04/11/21 Labs - Once weekly:  CBC/D and BMP, Labs - Every other week:  ESR and CRP Method of administration: IV Push Method of administration may be changed at the discretion of home infusion pharmacist based upon assessment of the patient and/or caregiver's ability to self-administer the medication ordered.   CITALOPRAM (CELEXA) 20 MG TABLET    Take 1 tablet (20 mg total) by mouth daily.   CYCLOBENZAPRINE (FLEXERIL) 10 MG TABLET    Take 1 tablet (10 mg total) by mouth 3 (three) times daily as needed for muscle spasms.   DIAZEPAM (VALIUM) 5 MG TABLET    Take 1 tablet (5 mg total) by mouth at bedtime.   ENOXAPARIN (LOVENOX) 40 MG/0.4ML INJECTION    Inject 0.4 mLs (40 mg total) into the skin daily.   FLUTICASONE (FLONASE) 50 MCG/ACT NASAL SPRAY    Place 1 spray into both nostrils daily.   GABAPENTIN (NEURONTIN) 400 MG CAPSULE    Take 2 capsules (800 mg total) by mouth 3 (three) times daily.   GLUCOSE BLOOD (ONETOUCH VERIO) TEST STRIP    Check blood sugars before meals and at bedtime.  twice daily   HYDROMORPHONE (DILAUDID) 2 MG TABLET    Take 1 tablet (2 mg total) by mouth every Monday, Wednesday, and Friday. Take 30 minutes prior to Wound VAC change   LIDOCAINE (LIDODERM) 5 %    Place 2 patches onto the skin daily. At  8 pm and remove at 8 am daily   LOSARTAN (COZAAR) 100 MG TABLET    Take 1 tablet (100 mg total) by mouth daily.   MENTHOL-METHYL SALICYLATE (MUSCLE RUB) 10-15 % CREA    Apply 1 application topically 4 (four) times daily -  with meals and at bedtime.   METFORMIN (GLUCOPHAGE) 1000 MG TABLET    TAKE 1 TABLET (1,000 MG TOTAL) BY MOUTH 2 (TWO) TIMES DAILY WITH A MEAL.   MORPHINE (MS CONTIN) 15 MG 12 HR TABLET    Take 2 tablets (30 mg total) by mouth every 12 (twelve) hours.   NUTRITION SUPPLEMENT, JUVEN, (JUVEN) PACK    Take 1 packet by mouth 2 (two) times daily between meals.   ONDANSETRON (ZOFRAN) 4 MG TABLET    Take 1 tablet (4  mg total) by mouth every 8 (eight) hours as needed for nausea or vomiting. Can cause constipation- monitor-   ONETOUCH DELICA LANCETS 44R MISC    Test blood sugar before meals and at bedtime.  Dx code: E11.9   OXYCODONE (ROXICODONE) 15 MG IMMEDIATE RELEASE TABLET    Take 1 tablet (15 mg total) by mouth every 8 (eight) hours as needed ((score 7 to 10)).   POLYETHYLENE GLYCOL (MIRALAX / GLYCOLAX) 17 G PACKET    Take 17 g by mouth 2 (two) times daily.   PROTEIN SUPPLEMENT (RESOURCE BENEPROTEIN) POWD    Take 6 g by mouth 3 (three) times daily with meals.   ROSUVASTATIN (CRESTOR) 20 MG TABLET    Take 1 tablet (20 mg total) by mouth daily.   SENNA (SENOKOT) 8.6 MG TABS TABLET    Take 3 tablets (25.8 mg total) by mouth 2 (two) times daily.   SIMETHICONE (MYLICON) 80 MG CHEWABLE TABLET    Chew 1 tablet (80 mg total) by mouth 4 (four) times daily as needed for flatulence.   TADALAFIL (CIALIS) 20 MG TABLET    TAKE 1/2 TO 1 TABLET BY MOUTH DAILY AS NEEDED FOR ERECTILE DYSFUNCTION   TAMSULOSIN (FLOMAX) 0.4 MG CAPS CAPSULE    Take 2 capsules (0.8 mg total) by mouth daily.   TIZANIDINE (ZANAFLEX) 4 MG TABLET    Take 1 tablet (4 mg total) by mouth 3 (three) times daily.   ZINC SULFATE 220 (50 ZN) MG TABS    Take 1 tablet (220 mg total) by mouth daily.  Modified Medications   No medications on file  Discontinued Medications   No medications on file    Subjective: Here for follow up in the setting of deep lumbar wound infection associated with hardware. After last seen by myself in 11/29, he has been closely following with wound care and has also been seen by plastics. Getting IV cefazolin through rt arm PICC without any issues related to PICC or antibiotics. Back pain is 8/10, able to stand and walk. He has a repeat CT done by Dr Zada Finders 12/21 with following findings  IMPRESSION: 1. L1-L2 discitis osteomyelitis with pronounced endplate erosion and generalized hardware loosening since October. Retropulsion  of the interbody implant into the right lateral recess and proximal right L1 neural foramen. Right L1 pedicle screw also partially eroded into that foramen.   2. Generalized paraspinal soft tissue infection and inflammation associated with #1, including a posterior superficial soft tissue wound that tracks cephalad to the right T11 vertebral level.   3. Underlying lower thoracic interbody ankylosis which remains solid through the T12-L1 anterior disc space. The spinal fusion hardware L3 through S1  remains stable since October, but there is no convincing arthrodesis at those levels.  He has a Fu with DR Zada Finders in Jan 13 for possible need for another surgical interevention per patient.  Has been told by wound care that his wound looks good. Denies fevers, chills and sweats. Denies nausea, vomiting and diarrhea. Denies issues with the PICC.   Review of Systems: ROS 10 point ROS done with pertinent positives and negatives as above  Past Medical History:  Diagnosis Date   DDD (degenerative disc disease), lumbar    Diabetes mellitus    Hyperlipemia    Hypertension    Obesity    OSA (obstructive sleep apnea)    on CPAP   Past Surgical History:  Procedure Laterality Date   APPLICATION OF ROBOTIC ASSISTANCE FOR SPINAL PROCEDURE N/A 01/11/2021   Procedure: APPLICATION OF ROBOTIC ASSISTANCE FOR SPINAL PROCEDURE;  Surgeon: Judith Part, MD;  Location: [facility];  Service: Neurosurgery;  Laterality: N/A;   APPLICATION OF WOUND VAC N/A 01/25/2021   Procedure: APPLICATION OF WOUND VAC;  Surgeon: Dawley, Theodoro Doing, DO;  Location: [facility];  Service: Neurosurgery;  Laterality: N/A;   BACK SURGERY     COLONOSCOPY  2019   LUMBAR WOUND DEBRIDEMENT N/A 01/25/2021   Procedure: Irrigation and Debridement of lumbar wound, and wound vacuum assisted closure;  Surgeon: Dawley, Theodoro Doing, DO;  Location: [facility];  Service: Neurosurgery;  Laterality: N/A;   TRANSFORAMINAL LUMBAR INTERBODY FUSION (TLIF) WITH  PEDICLE SCREW FIXATION 4 LEVEL N/A 01/11/2021   Procedure: Lumbar one-two, Lumbar two-three, Lumbar three-four, Lumbar four-five, Lumbar five Sacral one Open decompression, Transforaminal lumbar interbody fusion, posterolateral instrumented fusion;  Surgeon: Judith Part, MD;  Location: [facility];  Service: Neurosurgery;  Laterality: N/A;    Social History   Tobacco Use   Smoking status: Never   Smokeless tobacco: Never  Vaping Use   Vaping Use: Never used  Substance Use Topics   Alcohol use: No   Drug use: No    Family History  Problem Relation Age of Onset   Diabetes Mother    Hypertension Neg Hx    Coronary artery disease Neg Hx    Colon cancer Neg Hx    Prostate cancer Neg Hx     Allergies  Allergen Reactions   Cymbalta [Duloxetine Hcl] Other (See Comments)   Penicillins Other (See Comments)    REACTION: Blister and Sores in the mouth    Health Maintenance  Topic Date Due   Zoster Vaccines- Shingrix (1 of 2) Never done   Pneumococcal Vaccine 63-61 Years old (2 - PCV) 10/27/2015   OPHTHALMOLOGY EXAM  06/12/2017   COVID-19 Vaccine (5 - Booster for Pfizer series) 11/09/2020   INFLUENZA VACCINE  11/12/2020   FOOT EXAM  02/09/2021   HEMOGLOBIN A1C  07/08/2021   TETANUS/TDAP  12/28/2021   COLONOSCOPY (Pts 45-62yr Insurance coverage will need to be confirmed)  01/11/2025   Hepatitis C Screening  Completed   HIV Screening  Completed   HPV VACCINES  Aged Out    Objective: BP 136/86    Pulse (!) 115    Temp 98.1 F (36.7 C) (Oral)    SpO2 100%    Physical Exam Constitutional:      Appearance: Normal appearance. In wheelchair  HENT:     Head: Normocephalic and atraumatic.      Mouth: Mucous membranes are moist.  Eyes:    Conjunctiva/sclera: Conjunctivae normal.     Pupils: Pupils are equal,  round, and reactive to light.   Cardiovascular:     Rate and Rhythm: Normal rate and regular rhythm.     Heart sounds: No murmur heard. No friction rub. No gallop.    Pulmonary:     Effort: Pulmonary effort is normal.     Breath sounds: Normal breath sounds.   Abdominal:     General:     Palpations: Abdomen is soft.   Musculoskeletal:        General: Normal range of motion.   Skin:    General: Skin is warm and dry.     Comments:   Neurological:     General: No focal deficit present.     Mental Status: awake,  alert and oriented to person, place, and time.   Psychiatric:        Mood and Affect: Mood normal.   Lab Results Lab Results  Component Value Date   WBC 6.3 03/06/2021   HGB 7.5 (L) 03/06/2021   HCT 23.8 (L) 03/06/2021   MCV 87.2 03/06/2021   PLT 227 03/06/2021    Lab Results  Component Value Date   CREATININE 1.18 03/06/2021   BUN 17 03/06/2021   NA 135 03/06/2021   K 4.6 03/06/2021   CL 99 03/06/2021   CO2 30 03/06/2021    Lab Results  Component Value Date   ALT 13 02/01/2021   AST 14 (L) 02/01/2021   ALKPHOS 84 02/01/2021   BILITOT 0.7 02/01/2021    Lab Results  Component Value Date   CHOL 126 11/19/2020   HDL 34.00 (L) 11/19/2020   LDLCALC 62 11/19/2020   LDLDIRECT 118.0 09/14/2020   TRIG 148.0 11/19/2020   CHOLHDL 4 11/19/2020   No results found for: LABRPR, RPRTITER No results found for: HIV1RNAQUANT, HIV1RNAVL, CD4TABS  Problem List Items Addressed This Visit       Other   Hardware complicating wound infection ([place]) - Primary   Medication monitoring encounter   Loosening of hardware in spine ([place])   Assessment/Plan # Klebsiella Deep lumbar wound infection complicated with hardware # L1-L2 Discitis and osteomyelitis  with endplate erosion and generalised  hardware loosening # Paraspinal Myositis with a posterior soft tissue wound  tracking to T 11  # Medication Monitoring   Unclear at this time if the hardware loosening and erosive changes seen in the recent CT mechanical failure or infection related while on prolonged IV cefazolin course. Posterior wound seems to healing well. He has been on  a prolonged course of IV cefazolin since October with concerns for resistance to cefazolin as well. As such, Will switch cefazolin to ceftriaxone 2g iv daily for now. There is a plan for possible another surgery by Dr Zada Finders per patient.  Will continue IV ceftriaxone until next appt 04/30/20 when he has already seen Dr Zada Finders.  Weekly CBC, CMP, ESR and CRP Will request labs from Home health   I have personally spent 40 minutes involved in face-to-face and non-face-to-face activities for this patient on the day of the visit. Professional time spent includes the following activities: Preparing to see the patient (review of tests), Obtaining and/or reviewing separately obtained history (admission/discharge record), Performing a medically appropriate examination and/or evaluation , Ordering medications/tests/procedures, referring and communicating with other health care professionals, Documenting clinical information in the EMR, Independently interpreting results (not separately reported), Communicating results to the patient/family/caregiver, Counseling and educating the patient/family/caregiver and Care coordination (not separately reported).   Wilber Oliphant, [facility]  [facility] 04/11/2021, 9:58 AM

## 2021-04-11 NOTE — Telephone Encounter (Signed)
Thank you :)

## 2021-04-11 NOTE — Telephone Encounter (Signed)
Per Md called [facility] with orders to extend patient antbx until his appt on 1/17. Awaiting on call back from [facility] to relay orders. Pharmacist not available at this time. [name], RMA

## 2021-04-12 ENCOUNTER — Encounter (HOSPITAL_BASED_OUTPATIENT_CLINIC_OR_DEPARTMENT_OTHER): Payer: 59 | Admitting: Internal Medicine

## 2021-04-12 DIAGNOSIS — E11622 Type 2 diabetes mellitus with other skin ulcer: Secondary | ICD-10-CM | POA: Diagnosis not present

## 2021-04-12 DIAGNOSIS — L98424 Non-pressure chronic ulcer of back with necrosis of bone: Secondary | ICD-10-CM

## 2021-04-12 DIAGNOSIS — M869 Osteomyelitis, unspecified: Secondary | ICD-10-CM | POA: Diagnosis not present

## 2021-04-12 NOTE — Progress Notes (Signed)
[name] ([id number]) Visit Report for 04/04/2021 Chief Complaint Document Details Patient Name: Date of Service: [name] 04/04/2021 9:15 A M Medical Record Number: [id number] Patient Account Number: [id number] Date of Birth/Sex: Treating RN: 1966-04-09 (55 y.o. [name] Primary Care Provider: [name] Other Clinician: Referring Provider: Treating Provider/Extender: [name] Weeks in Treatment: 1 Information Obtained from: Patient Chief Complaint Back wound Electronic Signature(s) Signed: 04/04/2021 10:28:31 AM By: Kalman Shan DO Entered By: Kalman Shan on 04/04/2021 10:25:50 -------------------------------------------------------------------------------- HPI Details Patient Name: Date of Service: Christian Brown E. 04/04/2021 9:15 A M Medical Record Number: [id number] Patient Account Number: [id number] Date of Birth/Sex: Treating RN: April 06, 1966 (55 y.o. [name] Primary Care Provider: [name] Other Clinician: Referring Provider: Treating Provider/Extender: [name] Weeks in Treatment: 1 History of Present Illness HPI Description: Admission 03/28/2021 [name] is a 55 year old male with a past medical history of controlled type 2 diabetes on oral agents, obesity and OSA that presents to the clinic for a back wound. On 01/11/2021 patient had a laminectomy with PLIF of L1-S1 by Dr. Venetia Constable because of lumbar stenosis and radiculopathy. He subsequently developed bacteremia. He had CT imaging on 10/13 of the lumbar spine that showed fluid collection in the soft tissue of the posterior L1 and S1 and was taken to the OR for washout on 10/14. 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 He received 4 weeks of IV antibiotics by infectious disease. After his completion of 4 weeks of IV antibiotics he was continued for an additional 4 weeks of IV cefazolin with a stop date of 12/29. He  has been evaluated by plastic surgery and no plans for surgical intervention at this time. Wife is present and reports he has been on the wound VAC for the past 8 weeks with improvement in wound healing. He currently denies systemic signs of infection. 12/22; patient presents for follow-up. He reports no issues since last clinic visit. He denies signs of infection. He has been tolerating the wound VAC well. Electronic Signature(s) Signed: 04/04/2021 10:28:31 AM By: Kalman Shan DO Entered By: Kalman Shan on 04/04/2021 10:26:25 -------------------------------------------------------------------------------- Physical Exam Details Patient Name: Date of Service: Christian Brown E. 04/04/2021 9:15 A M Medical Record Number: [id number] Patient Account Number: [id number] Date of Birth/Sex: Treating RN: 14-Jun-1965 (55 y.o. [name] Primary Care Provider: [name] Other Clinician: Referring Provider: Treating Provider/Extender: [name] Weeks in Treatment: 1 Constitutional respirations regular, non-labored and within target range for patient.Marland Kitchen Psychiatric pleasant and cooperative. Notes Large open wound to the back. Muscle exposed. Granulation tissue throughout. There is undermining and tunneling. No probing to bone. No signs of infection. Electronic Signature(s) Signed: 04/04/2021 10:28:31 AM By: Kalman Shan DO Entered By: Kalman Shan on 04/04/2021 10:26:49 -------------------------------------------------------------------------------- Physician Orders Details Patient Name: Date of Service: Christian Brown E. 04/04/2021 9:15 A M Medical Record Number: [id number] Patient Account Number: [id number] Date of Birth/Sex: Treating RN: 02-16-1966 (55 y.o. 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 - Friday with Dr. Heber [place] Return A Nurse Visit: - Thursday 12/27 For Vac Change Bathing/ Shower/ Hygiene  May shower with protection but do not get wound dressing(s) wet. Negative Presssure Wound Therapy Wound Vac to wound continuously at 137mm/hg pressure Black Foam - wound base then bridge to right side White Foam - T tunnel at 12:00 and undermining from 4:00-6:00 o Additional Orders / Instructions Follow Nutritious Diet - Continue to monitor blood sugars Wound Treatment Electronic Signature(s) Signed: 04/04/2021 10:28:31 AM By: Kalman Shan DO Entered By: Kalman Shan on 04/04/2021 10:27:07 -------------------------------------------------------------------------------- Problem List Details Patient Name: Date of Service: Christian Brown E. 04/04/2021 9:15 A M Medical Record Number: [id number] Patient Account Number: [id number] Date of Birth/Sex: Treating RN: 1965/12/09 (55 y.o. 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 04/04/2021 9:15 A M Medical Record Number: [id number] Patient Account Number: [id number] Date of Birth/Sex: Treating RN: Oct 20, 1965 (55 y.o. [name] Primary Care Provider: [name] Other Clinician: Referring Provider: Treating Provider/Extender: [name] Weeks in Treatment: 1 Subjective Chief Complaint Information obtained from Patient Back wound History of Present Illness (HPI) Admission 03/28/2021 [name] is a 55 year old male with a past medical history of controlled type 2 diabetes on oral agents, obesity and OSA that presents to the clinic for a back wound. On 01/11/2021 patient had a laminectomy with PLIF of L1-S1 by Dr. Venetia Constable because of lumbar stenosis and radiculopathy. He subsequently developed bacteremia. He had CT imaging on 10/13 of the lumbar spine that showed fluid collection in the soft tissue of the posterior L1 and S1 and was taken to the OR for washout on 10/14. 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 He received 4 weeks of IV antibiotics by infectious disease. After his completion of 4 weeks of IV antibiotics he was continued for an additional 4 weeks of IV cefazolin with a stop date of 12/29. He has been evaluated by plastic surgery and no plans for surgical intervention at this time. Wife is present and reports he has been on the wound VAC for the past 8 weeks with improvement in wound healing. He currently denies systemic signs of infection. 12/22; patient presents for follow-up. He reports no issues since last clinic visit. He denies signs of infection. He has been tolerating the wound VAC well. Patient History Information obtained from Patient. Family History Diabetes - Mother, Stroke - Siblings, No family history of Cancer, Heart Disease, Hereditary Spherocytosis, Hypertension, Kidney Disease, Lung Disease, Seizures, Thyroid Problems, Tuberculosis. Social History Never smoker, Marital  Status - Married, Alcohol Use - Rarely, Drug Use - No History, Caffeine Use - Rarely. Medical History Cardiovascular Patient has history of Hypertension Endocrine Patient has history of Type II Diabetes Musculoskeletal Patient has history of Osteomyelitis Medical A Surgical History Notes nd Musculoskeletal DDD Objective Constitutional respirations regular, non-labored and within target range for patient.. Vitals Time Taken: 9:51 AM, Height: 74 in, Temperature: 98.5 F, Pulse: 120 bpm, Respiratory Rate: 20 breaths/min, Blood Pressure: 121/79 mmHg. Psychiatric pleasant and cooperative. General Notes: Large open wound  to the back. Muscle exposed. Granulation tissue throughout. There is undermining and tunneling. No probing to bone. No signs of infection. Integumentary (Hair, Skin) Wound #1 status is Open. Original cause of wound was Surgical Injury. The date acquired was: 01/11/2021. The wound has been in treatment 1 weeks. The wound is located on the Midline Back. The wound measures 15.8cm length x 5.2cm width x 2.1cm depth; 64.528cm^2 area and 135.509cm^3 volume. There is Fat Layer (Subcutaneous Tissue) exposed. There is no tunneling noted, however, there is undermining starting at 11:00 and ending at 1:00 with a maximum distance of 4.2cm. There is a large amount of serosanguineous drainage noted. The wound margin is well defined and not attached to the wound base. There is large (67-100%) red, hyper - granulation within the wound bed. There is no necrotic tissue within the wound bed. 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 No signs of infection on exam. I recommended continuing the wound VAC. Plan Follow-up Appointments: Return Appointment in 1 week. - Friday with Dr.  Heber [place] Nurse Visit: - Thursday 12/27 For Vac Change Bathing/ Shower/ Hygiene: May shower with protection but do not get wound dressing(s) wet. Negative Presssure Wound Therapy: Wound Vac to wound continuously at 187mm/hg pressure Black Foam - wound base then bridge to right side White Foam - T tunnel at 12:00 and undermining from 4:00-6:00 o Additional Orders / Instructions: Follow Nutritious Diet - Continue to monitor blood sugars 1. Wound VAC 2. Follow-up in 1 week Electronic Signature(s) Signed: 04/04/2021 10:28:31 AM By: Kalman Shan DO Entered By: Kalman Shan on 04/04/2021 10:27:42 -------------------------------------------------------------------------------- HxROS Details Patient Name: Date of Service: Christian Brown E. 04/04/2021 9:15 A M Medical Record Number: [id number] Patient Account Number: [id number] Date of Birth/Sex: Treating RN: 02/16/1966 (55 y.o. [name] Primary Care Provider: [name] Other Clinician: Referring Provider: Treating Provider/Extender: [name] Weeks in Treatment: 1 Information Obtained From Patient Cardiovascular Medical History: Positive for: Hypertension Endocrine Medical History: Positive for: Type II Diabetes Time with diabetes: Since mid 90's Treated with: Oral agents Blood sugar tested every day: Yes Tested : 2x day Musculoskeletal Medical History: Positive for: Osteomyelitis Past Medical History Notes: DDD Immunizations Pneumococcal Vaccine: Received Pneumococcal Vaccination: Yes Received Pneumococcal Vaccination On or After 60th Birthday: No Implantable Devices Yes Family and Social History Cancer: No; Diabetes: Yes - Mother; Heart Disease: No; Hereditary Spherocytosis: No; Hypertension: No; Kidney Disease: No; Lung Disease: No; Seizures: No; Stroke: Yes - Siblings; Thyroid Problems: No; Tuberculosis: No; Never smoker; Marital Status - Married; Alcohol Use: Rarely; Drug Use: No  History; Caffeine Use: Rarely; Financial Concerns: No; Food, Clothing or Shelter Needs: No; Support System Lacking: No; Transportation Concerns: No Electronic Signature(s) Signed: 04/04/2021 10:28:31 AM By: Kalman Shan DO Signed: 04/04/2021 4:57:12 PM By: Levan Hurst RN, BSN Entered By: Kalman Shan on 04/04/2021 10:26:32 -------------------------------------------------------------------------------- [place] Details Patient Name: Date of Service: Christian Grout. 04/04/2021 Medical Record Number: [id number] Patient Account Number: [id number] Date of Birth/Sex: Treating RN: 1965/07/30 (55 y.o. 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 M86.9 Osteomyelitis, unspecified E11.622 Type 2 diabetes mellitus with other skin ulcer Facility Procedures CPT4 Code:  [id number] Description: 64383 - WOUND VAC-GREATER TH 50 SQ CM Modifier: Quantity: 1 Physician Procedures : CPT4 Code Description Modifier [id number] - WC PHYS LEVEL 3 - EST PT ICD-10 Diagnosis Description L98.424 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

## 2021-04-12 NOTE — Progress Notes (Signed)
JOSHWA, HEMRIC ([id number]) Visit Report for 04/12/2021 Chief Complaint Document Details Patient Name: Date of Service: [name], [name] 04/12/2021 9:00 A M Medical Record Number: [id number] Patient Account Number: [id number] Date of Birth/Sex: Treating RN: 1965/06/19 (55 y.o. Ernestene Mention Primary Care Provider: [name] Other Clinician: Referring Provider: Treating Provider/Extender: [name] Weeks in Treatment: 2 Information Obtained from: Patient Chief Complaint Back wound Electronic Signature(s) Signed: 04/12/2021 12:30:36 PM By: Kalman Shan DO Entered By: Kalman Shan on 04/12/2021 12:15:24 -------------------------------------------------------------------------------- HPI Details Patient Name: Date of Service: Christian Brown E. 04/12/2021 9:00 A M Medical Record Number: [id number] Patient Account Number: [id number] Date of Birth/Sex: Treating RN: 01/09/1966 (55 y.o. Ernestene Mention Primary Care Provider: [name] Other Clinician: Referring Provider: Treating Provider/Extender: [name] Weeks in Treatment: 2 History of Present Illness HPI Description: Admission 03/28/2021 [name] is a 55 year old male with a past medical history of controlled type 2 diabetes on oral agents, obesity and OSA that presents to the clinic for a back wound. On 01/11/2021 patient had a laminectomy with PLIF of L1-S1 by Dr. Venetia Constable because of lumbar stenosis and radiculopathy. He subsequently developed bacteremia. He had CT imaging on 10/13 of the lumbar spine that showed fluid collection in the soft tissue of the posterior L1 and S1 and was taken to the OR for washout on 10/14. 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 He received 4 weeks of IV antibiotics by infectious disease. After his completion of 4 weeks of IV antibiotics he was continued for an additional 4 weeks of IV cefazolin with a stop date of 12/29.  He has been evaluated by plastic surgery and no plans for surgical intervention at this time. Wife is present and reports he has been on the wound VAC for the past 8 weeks with improvement in wound healing. He currently denies systemic signs of infection. 12/22; patient presents for follow-up. He reports no issues since last clinic visit. He denies signs of infection. He has been tolerating the wound VAC well. 12/30; patient presents for follow-up. He reports no issues and has no complaints today. He has been tolerating the wound VAC well. Electronic Signature(s) Signed: 04/12/2021 12:30:36 PM By: Kalman Shan DO Entered By: Kalman Shan on 04/12/2021 12:16:03 -------------------------------------------------------------------------------- Physical Exam Details Patient Name: Date of Service: Christian Brown E. 04/12/2021 9:00 A M Medical Record Number: [id number] Patient Account Number: [id number] Date of Birth/Sex: Treating RN: 10-19-1965 (55 y.o. Ernestene Mention Primary Care Provider: [name] Other Clinician: Referring Provider: Treating Provider/Extender: [name] Weeks in Treatment: 2 Constitutional respirations regular, non-labored and within target range for patient.Marland Kitchen Psychiatric pleasant and cooperative. Notes Large open wound to the back. Muscle exposed. Granulation tissue throughout. There is undermining and tunneling. No probing to bone. No signs of infection. Electronic Signature(s) Signed: 04/12/2021 12:30:36 PM By: Kalman Shan DO Entered By: Kalman Shan on 04/12/2021 12:17:02 -------------------------------------------------------------------------------- Physician Orders Details Patient Name: Date of Service: Christian Brown E. 04/12/2021 9:00 A M Medical Record Number: [id number] Patient Account Number: [id number] Date of Birth/Sex: Treating RN: [birth year] (55 y.o. [name] Primary Care Provider: [name] Other  Clinician: Referring Provider: Treating Provider/Extender: [name] Weeks in Treatment: 2 Verbal / Phone Orders: No Diagnosis Coding Follow-up Appointments ppointment in 1 week. - Friday with Dr. Heber Hamilton Return A Nurse Visit: - Tuesday for vac change ***EXTRA TIME FOR COMPLEX WOUND [facility]**** Bathing/ Shower/  Hygiene May shower with protection but do not get wound dressing(s) wet. Negative Presssure Wound Therapy Wound Vac to wound continuously at 176mm/hg pressure Black Foam - wound base then bridge to right side White Foam - T tunnel at 12:00 and undermining from 4:00-6:00 o Additional Orders / Instructions Follow Nutritious Diet - Continue to monitor blood sugars Wound Treatment Electronic Signature(s) Signed: 04/12/2021 12:30:36 PM By: Kalman Shan DO Entered By: Kalman Shan on 04/12/2021 12:17:41 -------------------------------------------------------------------------------- Problem List Details Patient Name: Date of Service: [name]. 04/12/2021 9:00 A M Medical Record Number: [id number] Patient Account Number: [id number] Date of Birth/Sex: Treating RN: [birth year] (55 y.o. 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  04/12/2021 9:00 A M Medical Record Number: [id number] Patient Account Number: [id number] Date of Birth/Sex: Treating RN: Sep 09, 1965 (55 y.o. Ernestene Mention Primary Care Provider: [name] Other Clinician: Referring Provider: Treating Provider/Extender: [name] Weeks in Treatment: 2 Subjective Chief Complaint Information obtained from Patient Back wound History of Present Illness (HPI) Admission 03/28/2021 Mr. Delno Blaisdell is a 55 year old male with a past medical history of controlled type 2 diabetes on oral agents, obesity and OSA that presents to the clinic for a back wound. On 01/11/2021 patient had a laminectomy with PLIF of L1-S1 by Dr. Venetia Constable because of lumbar stenosis and radiculopathy. He subsequently developed bacteremia. He had CT imaging on 10/13 of the lumbar spine that showed fluid collection in the soft tissue of the posterior L1 and S1 and was taken to the OR for washout on 10/14. 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 He received 4 weeks of IV antibiotics by infectious disease. After his completion of 4 weeks of IV antibiotics he was continued for an additional 4 weeks of IV cefazolin with a stop date of 12/29. He has been evaluated by plastic surgery and no plans for surgical intervention at this time. Wife is present and reports he has been on the wound VAC for the past 8 weeks with improvement in wound healing. He currently denies systemic signs of infection. 12/22; patient presents for follow-up. He reports no issues since last clinic visit. He denies signs of infection. He has been tolerating the wound VAC well. 12/30; patient presents for follow-up. He reports no issues and has no complaints today. He has been tolerating the wound VAC well. Patient History Information obtained from Patient. Family History Diabetes - Mother, Stroke - Siblings, No family history of Cancer, Heart Disease, Hereditary Spherocytosis,  Hypertension, Kidney Disease, Lung Disease, Seizures, Thyroid Problems, Tuberculosis. Social History Never smoker, Marital Status - Married, Alcohol Use - Rarely, Drug Use - No History, Caffeine Use - Rarely. Medical History Cardiovascular Patient has history of Hypertension Endocrine Patient has history of Type II Diabetes Musculoskeletal Patient has history of Osteomyelitis Medical A Surgical History Notes nd Musculoskeletal DDD Objective Constitutional respirations regular, non-labored and within target range for patient.. Vitals Time Taken: 9:07 AM, Height: 74 in,  Temperature: 97.8 F, Pulse: 116 bpm, Respiratory Rate: 20 breaths/min, Blood Pressure: 131/85 mmHg. Psychiatric pleasant and cooperative. General Notes: Large open wound to the back. Muscle exposed. Granulation tissue throughout. There is undermining and tunneling. No probing to bone. No signs of infection. Integumentary (Hair, Skin) Wound #1 status is Open. Original cause of wound was Surgical Injury. The date acquired was: 01/11/2021. The wound has been in treatment 2 weeks. The wound is located on the Midline Back. The wound measures 15.5cm length x 5cm width x 2.1cm depth; 60.868cm^2 area and 127.824cm^3 volume. There is a large amount of serosanguineous drainage noted. 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 He has more granulation tissue present. He had a CT lumbar spine on 12/21 with the findings below. IMPRESSION: 1. L1-L2 discitis osteomyelitis with pronounced endplate erosion and generalized hardware loosening since October. Retropulsion of the interbody implant into the right lateral recess and proximal right L1 neural foramen. Right L1 pedicle screw also partially eroded into that foramen. 2. Generalized  paraspinal soft tissue infection and inflammation associated with #1, including a posterior superficial soft tissue wound that tracks cephalad to the right T11 vertebral level. 3. Underlying lower thoracic interbody ankylosis which remains solid through the T12-L1 anterior disc space. The spinal fusion hardware L3 through S1 remains stable since October, but there is no convincing arthrodesis at those levels. He is scheduled to see Dr. Venetia Constable on January 13 for follow-up for possible surgical intervention. He also saw Dr. Yvette Rack with infectious disease and she changed his cefazolin to ceftriaxone. Patient had the wound VAC initiated by his neurosurgeon on 10/14. He has had this in place for the past 2 months without issues. We will continue it today. Plan Follow-up Appointments: Return Appointment in 1 week. - Friday with Dr. Heber  Nurse Visit: - Tuesday for vac change ***EXTRA TIME FOR COMPLEX WOUND VAC**** Bathing/ Shower/ Hygiene: May shower with protection but do not get wound dressing(s) wet. Negative Presssure Wound Therapy: Wound Vac to wound continuously at 140mm/hg pressure Black Foam - wound base then bridge to right side White Foam - T tunnel at 12:00 and undermining from 4:00-6:00 o Additional Orders / Instructions: Follow Nutritious Diet - Continue to monitor blood sugars 1. Wound VAC 2. Follow-up in 1 week Electronic Signature(s) Signed: 04/12/2021 12:30:36 PM By: Kalman Shan DO Entered By: Kalman Shan on 04/12/2021 12:29:11 -------------------------------------------------------------------------------- HxROS Details Patient Name: Date of Service: Christian Brown E. 04/12/2021 9:00 A M Medical Record Number: [id number] Patient Account Number: [id number] Date of Birth/Sex: Treating RN: 04-Nov-1965 (55 y.o. Ernestene Mention Primary Care Provider: [name] Other Clinician: Referring Provider: Treating Provider/Extender: [name] in Treatment: 2 Information Obtained From Patient Cardiovascular Medical History: Positive for: Hypertension Endocrine Medical History: Positive for: Type II Diabetes Time with diabetes: Since mid 90's Treated with: Oral agents Blood sugar tested every day: Yes Tested : 2x day Musculoskeletal Medical History: Positive for: Osteomyelitis Past Medical History Notes: DDD Immunizations Pneumococcal Vaccine: Received Pneumococcal Vaccination: Yes Received Pneumococcal Vaccination On or After 60th Birthday: No Implantable Devices Yes Family and Social History Cancer: No; Diabetes: Yes - Mother; Heart Disease: No; Hereditary Spherocytosis: No; Hypertension: No; Kidney Disease: No; Lung Disease: No; Seizures: No; Stroke: Yes - Siblings; Thyroid Problems: No; Tuberculosis: No; Never smoker; Marital Status - Married; Alcohol Use: Rarely; Drug  Use: No History; Caffeine Use: Rarely; Financial Concerns: No; Food, Clothing or Shelter Needs: No; Support System Lacking: No; Transportation Concerns: No Electronic Signature(s) Signed: 04/12/2021 12:30:36 PM By: Kalman Shan DO Signed: 04/12/2021 1:50:30 PM By: Baruch Gouty RN, BSN Entered By: Kalman Shan on 04/12/2021 12:16:36 -------------------------------------------------------------------------------- [place] Details Patient Name: Date of Service: [name] 04/12/2021 Medical Record Number: [id number] Patient Account Number: [id number] Date of Birth/Sex: Treating RN: 10/02/1965 (55 y.o. 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 M86.9 Osteomyelitis, unspecified E11.622 Type 2 diabetes mellitus with other skin ulcer Facility Procedures CPT4 Code:  [id number] Description: 41660 - WOUND VAC-GREATER TH 50 SQ CM Modifier: Quantity: 1 Physician Procedures : CPT4 Code Description Modifier [id number] 09323 - WC PHYS LEVEL 3 - EST PT ICD-10 Diagnosis Description L98.424 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

## 2021-04-16 ENCOUNTER — Other Ambulatory Visit: Payer: Self-pay

## 2021-04-16 ENCOUNTER — Encounter (HOSPITAL_BASED_OUTPATIENT_CLINIC_OR_DEPARTMENT_OTHER): Payer: 59 | Attending: Internal Medicine | Admitting: Internal Medicine

## 2021-04-16 ENCOUNTER — Encounter: Payer: Self-pay | Admitting: Physical Medicine and Rehabilitation

## 2021-04-16 DIAGNOSIS — E669 Obesity, unspecified: Secondary | ICD-10-CM | POA: Diagnosis not present

## 2021-04-16 DIAGNOSIS — E11622 Type 2 diabetes mellitus with other skin ulcer: Secondary | ICD-10-CM | POA: Insufficient documentation

## 2021-04-16 DIAGNOSIS — E1169 Type 2 diabetes mellitus with other specified complication: Secondary | ICD-10-CM | POA: Insufficient documentation

## 2021-04-16 DIAGNOSIS — Z7984 Long term (current) use of oral hypoglycemic drugs: Secondary | ICD-10-CM | POA: Diagnosis not present

## 2021-04-16 DIAGNOSIS — G4733 Obstructive sleep apnea (adult) (pediatric): Secondary | ICD-10-CM | POA: Diagnosis not present

## 2021-04-16 DIAGNOSIS — L98424 Non-pressure chronic ulcer of back with necrosis of bone: Secondary | ICD-10-CM | POA: Insufficient documentation

## 2021-04-16 DIAGNOSIS — M869 Osteomyelitis, unspecified: Secondary | ICD-10-CM | POA: Diagnosis not present

## 2021-04-16 DIAGNOSIS — Z981 Arthrodesis status: Secondary | ICD-10-CM | POA: Insufficient documentation

## 2021-04-16 NOTE — Progress Notes (Signed)
KAISYN, MILLEA ([id number]) Visit Report for 04/16/2021 SuperBill Details Patient Name: Date of Service: CLAIRE, BRIDGE 04/16/2021 Medical Record Number: [id number] Patient Account Number: [id number] Date of Birth/Sex: Treating RN: 28-Aug-1965 (56 y.o. 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

## 2021-04-16 NOTE — Progress Notes (Signed)
[name], [name] ([id number]) Visit Report for 04/16/2021 Arrival Information Details Patient Name: Date of Service: [name], [name] 04/16/2021 9:45 A M Medical Record Number: [id number] Patient Account Number: [id number] Date of Birth/Sex: Treating RN: 1965-12-21 (56 y.o. [name], [name] Primary Care Kalie Cabral: [name] Other Clinician: Referring Thurl Boen: Treating Jayvin Hurrell/Extender: [name] in Treatment: 2 Visit Information History Since Last Visit Added or deleted any medications: No Patient Arrived: Wheel Chair Any new allergies or adverse reactions: No Arrival Time: 10:01 Had a fall or experienced change in No Accompanied By: friend activities of daily living that may affect Transfer Assistance: None risk of falls: Patient Identification Verified: Yes Signs or symptoms of abuse/neglect since last visito No Secondary Verification Process Completed: Yes Hospitalized since last visit: No Patient Requires Transmission-Based Precautions: No Implantable device outside of the clinic excluding No Patient Has Alerts: Yes cellular tissue based products placed in the center Patient Alerts: Patient on Blood Thinner since last visit: No BP Right Arm-PICC Has Dressing in Place as Prescribed: Yes Pain Present Now: Yes Electronic Signature(s) Signed: 04/16/2021 4:35:58 PM By: [name] RN Entered By: [name] on 04/16/2021 10:01:43 -------------------------------------------------------------------------------- Encounter Discharge Information Details Patient Name: Date of Service: Christian Brown E. 04/16/2021 9:45 A M Medical Record Number: [id number] Patient Account Number: [id number] Date of Birth/Sex: Treating RN: 1965-12-15 (56 y.o. [name] Primary Care Oluwatobi Ruppe: [name] Other Clinician: Referring Zadin Lange: Treating Jerzie Bieri/Extender: [name] in Treatment: 2 Encounter Discharge Information Items Discharge  Condition: Stable Ambulatory Status: Wheelchair Discharge Destination: Home Transportation: Private Auto Accompanied By: friend Schedule Follow-up Appointment: Yes Clinical Summary of Care: Patient Declined Electronic Signature(s) Signed: 04/16/2021 4:35:58 PM By: [name] RN Entered By: [name] on 04/16/2021 10:45:25 -------------------------------------------------------------------------------- Negative Pressure Wound Therapy Maintenance (NPWT) Details Patient Name: Date of Service: [name], [name] 04/16/2021 9:45 A M Medical Record Number: [id number] Patient Account Number: [id number] Date of Birth/Sex: Treating RN: 1966-01-30 (56 y.o. [name], [name] Primary Care Kyonna Frier: [name] Other Clinician: Referring Jules Baty: Treating Khara Renaud/Extender: Opal Sidles, Ashley Murrain in Treatment: 2 NPWT Maintenance Performed for: Wound #1 Midline Back Performed By: [name], RN Type: VAC System Coverage Size (sq cm): 77.5 Pressure Type: Constant Pressure Setting: 125 mmHG Drain Type: None Primary Contact: Non-Adherent Sponge/Dressing Type: Combination : 2 white 1 black Date Initiated: 03/28/2021 Dressing Removed: No Quantity of Sponges/Gauze Removed: 2 white 1 black Canister Changed: No Canister Exudate Volume: 50 Dressing Reapplied: No Quantity of Sponges/Gauze Inserted: 2 white i black Respones T Treatment: o tolerates well Days On NPWT : 20 Electronic Signature(s) Signed: 04/16/2021 4:35:58 PM By: [name] RN Entered By: [name] on 04/16/2021 10:03:34 -------------------------------------------------------------------------------- Patient/Caregiver Education Details Patient Name: Date of Service: [name] 1/3/2023andnbsp9:45 [place] Record Number: [id number] Patient Account Number: [id number] Date of Birth/Gender: Treating RN: Apr 09, 1966 (56 y.o. [name] Primary Care Physician: [name] Other Clinician: Referring Physician: Treating Physician/Extender: [name] in Treatment: 2 Education Assessment Education Provided To: Patient Education Topics Provided Wound/Skin Impairment: Methods: Explain/Verbal Responses: State content correctly Electronic Signature(s) Signed: 04/16/2021 4:35:58 PM By: [name] RN Entered By: [name] on 04/16/2021 10:45:07 -------------------------------------------------------------------------------- Wound Assessment Details Patient Name: Date of Service: [name]. 04/16/2021 9:45 A M Medical Record Number: [id number] Patient Account Number: [id number] Date of Birth/Sex: Treating RN: 02/01/1966 (56 y.o. [name] Primary Care Zettie Gootee: [name] Other Clinician: Referring Sharrod Achille: Treating  Shenica Holzheimer/Extender: Opal Sidles, Jacqulyn Bath Weeks in Treatment: 2 Wound Status Wound Number: 1 Primary Etiology: Open Surgical Wound Wound Location: Midline Back Wound Status: Open Wounding Event: Surgical Injury Date Acquired: 01/11/2021 Weeks Of Treatment: 2 Clustered Wound: No Wound Measurements Length: (cm) 15.5 Width: (cm) 5 Depth: (cm) 2.1 Area: (cm) 60.868 Volume: (cm) 127.824 % Reduction in Area: 19.5% % Reduction in Volume: 26.5% Wound Description Classification: Full Thickness Without Exposed Support Structu Exudate Amount: Large Exudate Type: Serosanguineous Exudate Color: red, brown res Treatment Notes Wound #1 (Back) Wound Laterality: Midline Cleanser Peri-Wound Care Topical Primary Dressing Secondary Dressing Secured With Compression Wrap Compression Stockings Add-Ons Electronic Signature(s) Signed: 04/16/2021 4:35:58 PM By: [name] RN Entered By: [name] on 04/16/2021 10:02:27 -------------------------------------------------------------------------------- Vitals Details Patient Name: Date of Service: Christian Brown E. 04/16/2021  9:45 A M Medical Record Number: [id number] Patient Account Number: [id number] Date of Birth/Sex: Treating RN: Nov 30, 1965 (56 y.o. [name], [name] Primary Care Marvin Maenza: [name] Other Clinician: Referring Faria Casella: Treating [place] Lapaglia/Extender: [name] in Treatment: 2 Vital Signs Time Taken: 10:00 Temperature (F): 98.7 Height (in): 74 Pulse (bpm): 118 Respiratory Rate (breaths/min): 17 Blood Pressure (mmHg): 90/60 Reference Range: 80 - 120 mg / dl Electronic Signature(s) Signed: 04/16/2021 4:35:58 PM By: [name] RN Entered By: [name] on 04/16/2021 10:02:11

## 2021-04-16 NOTE — Progress Notes (Signed)
[name] ([id number]) Visit Report for 04/12/2021 Arrival Information Details Patient Name: Date of Service: [name], [name] 04/12/2021 9:00 A M Medical Record Number: [id number] Patient Account Number: [id number] Date of Birth/Sex: Treating RN: 05-Sep-1965 (56 y.o. Ernestene Mention Primary Care Lennis Korb: [name] Other Clinician: Referring Evaleigh Mccamy: Treating Lavert Matousek/Extender: [name] Weeks in Treatment: 2 Visit Information History Since Last Visit Added or deleted any medications: No Patient Arrived: Wheel Chair Any new allergies or adverse reactions: No Arrival Time: 09:07 Had a fall or experienced change in No Accompanied By: brother activities of daily living that may affect Transfer Assistance: None risk of falls: Patient Identification Verified: Yes Signs or symptoms of abuse/neglect since last visito No Secondary Verification Process Completed: Yes Hospitalized since last visit: No Patient Requires Transmission-Based Precautions: No Implantable device outside of the clinic excluding No Patient Has Alerts: Yes cellular tissue based products placed in the center Patient Alerts: Patient on Blood Thinner since last visit: No BP Right Arm-PICC Has Dressing in Place as Prescribed: Yes Pain Present Now: Yes Electronic Signature(s) Signed: 04/16/2021 8:21:58 AM By: Sandre Kitty Entered By: Sandre Kitty on 04/12/2021 09:07:43 -------------------------------------------------------------------------------- Encounter Discharge Information Details Patient Name: Date of Service: Christian Brown E. 04/12/2021 9:00 A M Medical Record Number: [id number] Patient Account Number: [id number] Date of Birth/Sex: Treating RN: 05-25-1965 (56 y.o. [name] Primary Care Cuma Polyakov: [name] Other Clinician: Referring Camdon Saetern: Treating Sabria Florido/Extender: [name] in Treatment: 2 Encounter Discharge Information Items Discharge  Condition: Stable Ambulatory Status: Wheelchair Discharge Destination: Home Transportation: Private Auto Accompanied By: Denman George Schedule Follow-up Appointment: Yes Clinical Summary of Care: Patient Declined Electronic Signature(s) Signed: 04/12/2021 12:28:13 PM By: [name] RN Entered By: [name] on 04/12/2021 10:12:04 -------------------------------------------------------------------------------- Lower Extremity Assessment Details Patient Name: Date of Service: Christian Brown E. 04/12/2021 9:00 [place] Record Number: [id number] Patient Account Number: [id number] Date of Birth/Sex: Treating RN: Oct 21, 1965 (56 y.o. Ernestene Mention Primary Care Antwion Carpenter: [name] Other Clinician: Referring Naavya Postma: Treating Guadalupe Nickless/Extender: [name] Weeks in Treatment: 2 Electronic Signature(s) Signed: 04/12/2021 1:50:30 PM By: Baruch Gouty RN, BSN Signed: 04/16/2021 8:21:58 AM By: Sandre Kitty Entered By: Sandre Kitty on 04/12/2021 09:08:21 -------------------------------------------------------------------------------- Multi Wound Chart Details Patient Name: Date of Service: Christian Brown E. 04/12/2021 9:00 A M Medical Record Number: [id number] Patient Account Number: [id number] Date of Birth/Sex: Treating RN: 07-05-1965 (56 y.o. Ernestene Mention Primary Care Rafia Shedden: [name] Other Clinician: Referring Meghin Thivierge: Treating Waver Dibiasio/Extender: [name] Weeks in Treatment: 2 Vital Signs Height(in): 74 Pulse(bpm): 116 Weight(lbs): Blood Pressure(mmHg): 131/85 Body Mass Index(BMI): Temperature(F): 97.8 Respiratory Rate(breaths/min): 20 Photos: [N/A:N/A] Midline Back N/A N/A Wound Location: Surgical Injury N/A N/A Wounding Event: Open Surgical Wound N/A N/A Primary Etiology: Hypertension, Type II Diabetes, N/A N/A Comorbid History: Osteomyelitis 01/11/2021 N/A N/A Date Acquired: 2 N/A N/A Weeks of  Treatment: Open N/A N/A Wound Status: 15.5x5x2.1 N/A N/A Measurements L x W x D (cm) 60.868 N/A N/A A (cm) : rea 127.824 N/A N/A Volume (cm) : 19.50% N/A N/A % Reduction in Area: 26.50% N/A N/A % Reduction in Volume: Full Thickness Without Exposed N/A N/A Classification: Support Structures Large N/A N/A Exudate Amount: Serosanguineous N/A N/A Exudate Type: red, brown N/A N/A Exudate Color: Negative Pressure Wound Therapy N/A N/A Procedures Performed: Maintenance (NPWT) Treatment Notes Wound #1 (Back) Wound Laterality: Midline Cleanser Peri-Wound Care Topical Primary Dressing Secondary Dressing Secured With Compression Wrap Compression Stockings Add-Ons  Electronic Signature(s) Signed: 04/12/2021 12:30:36 PM By: Kalman Shan DO Signed: 04/12/2021 1:50:30 PM By: Baruch Gouty RN, BSN Entered By: Kalman Shan on 04/12/2021 12:15:12 -------------------------------------------------------------------------------- Multi-Disciplinary Care Plan Details Patient Name: Date of Service: Christian Brown E. 04/12/2021 9:00 A M Medical Record Number: [id number] Patient Account Number: [id number] Date of Birth/Sex: Treating RN: [birth year] (56 y.o. [name] Primary Care Orvella Digiulio: [name] Other Clinician: Referring Niah Heinle: Treating Devario Bucklew/Extender: [name] Weeks in Treatment: 2 Active Inactive Osteomyelitis Nursing Diagnoses: Infection: osteomyelitis Goals: Patient's osteomyelitis will resolve Date Initiated: 03/28/2021 Target Resolution Date: 04/25/2021 Goal Status: Active Interventions: Assess for signs and symptoms of osteomyelitis resolution every visit Treatment Activities: MRI : 04/02/2021 Systemic antibiotics : 03/28/2021 Notes: Wound/Skin Impairment Nursing Diagnoses: Impaired tissue integrity Goals: Patient/caregiver will verbalize understanding of skin care regimen Date Initiated: 03/28/2021 Target  Resolution Date: 04/25/2021 Goal Status: Active Ulcer/skin breakdown will have a volume reduction of 30% by week 4 Date Initiated: 03/28/2021 Target Resolution Date: 04/25/2021 Goal Status: Active Interventions: Assess patient/caregiver ability to obtain necessary supplies Assess patient/caregiver ability to perform ulcer/skin care regimen upon admission and as needed Assess ulceration(s) every visit Provide education on ulcer and skin care Treatment Activities: Topical wound management initiated : 03/28/2021 Notes: Electronic Signature(s) Signed: 04/12/2021 12:28:13 PM By: [name] RN Entered By: [name] on 04/12/2021 09:29:09 -------------------------------------------------------------------------------- Negative Pressure Wound Therapy Maintenance (NPWT) Details Patient Name: Date of Service: KAYDENCE, BABA 04/12/2021 9:00 [place] Record Number: [id number] Patient Account Number: [id number] Date of Birth/Sex: Treating RN: 12/21/1965 (56 y.o. [name] Primary Care Hadli Vandemark: [name] Other Clinician: Referring Anuja Manka: Treating Elian Gloster/Extender: [name] Weeks in Treatment: 2 NPWT Maintenance Performed for: Wound #1 Midline Back Performed By: [name], RN Type: VAC System Coverage Size (sq cm): 77.5 Pressure Type: Constant Pressure Setting: 125 mmHG Drain Type: None Primary Contact: Non-Adherent Sponge/Dressing Type: Combination : Date Initiated: 03/28/2021 Dressing Removed: No Quantity of Sponges/Gauze Removed: 1 WHITE 1 BLACK Canister Changed: No Canister Exudate Volume: 50 Dressing Reapplied: No Quantity of Sponges/Gauze Inserted: 1 WHITE FOAM 1 BLACK Respones T Treatment: o tolerated well Days On NPWT : 16 Post Procedure Diagnosis Same as Pre-procedure Electronic Signature(s) Signed: 04/12/2021 12:28:13 PM By: [name] RN Entered By: [name] on 04/12/2021  10:11:00 -------------------------------------------------------------------------------- Pain Assessment Details Patient Name: Date of Service: Christian Brown E. 04/12/2021 9:00 A M Medical Record Number: [id number] Patient Account Number: [id number] Date of Birth/Sex: Treating RN: [birth year] (56 y.o. Ernestene Mention Primary Care Jacinta Penalver: [name] Other Clinician: Referring Ange Puskas: Treating Zayna Toste/Extender: [name] Weeks in Treatment: 2 Active Problems Location of Pain Severity and Description of Pain Patient Has Paino Yes Site Locations Rate the pain. Current Pain Level: 8 Pain Management and Medication Current Pain Management: Electronic Signature(s) Signed: 04/12/2021 1:50:30 PM By: Baruch Gouty RN, BSN Signed: 04/16/2021 8:21:58 AM By: Sandre Kitty Entered By: Sandre Kitty on 04/12/2021 09:08:15 -------------------------------------------------------------------------------- Patient/Caregiver Education Details Patient Name: Date of Service: [place], [name] 12/30/2022andnbsp9:00 A M Medical Record Number: [id number] Patient Account Number: [id number] Date of Birth/Gender: Treating RN: 04/26/1965 (56 y.o. [name] Primary Care Physician: [name] Other Clinician: Referring Physician: Treating Physician/Extender: [name] in Treatment: 2 Education Assessment Education Provided To: Patient Education Topics Provided Wound/Skin Impairment: Methods: Explain/Verbal Responses: State content correctly Electronic Signature(s) Signed: 04/12/2021 12:28:13 PM By: [name] RN Entered By: [name] on 04/12/2021 09:29:44 -------------------------------------------------------------------------------- Wound Assessment Details Patient  Name: Date of Service: ODYN, TURKO 04/12/2021 9:00 A M Medical Record Number: [id number] Patient Account Number: [id number] Date of Birth/Sex: Treating  RN: 08-Aug-1965 (56 y.o. Ernestene Mention Primary Care Lani Havlik: [name] Other Clinician: Referring Dominie Benedick: Treating Jennavieve Arrick/Extender: [name] Weeks in Treatment: 2 Wound Status Wound Number: 1 Primary Etiology: Open Surgical Wound Wound Location: Midline Back Wound Status: Open Wounding Event: Surgical Injury Comorbid History: Hypertension, Type II Diabetes, Osteomyelitis Date Acquired: 01/11/2021 Weeks Of Treatment: 2 Clustered Wound: No Photos Wound Measurements Length: (cm) 15.5 Width: (cm) 5 Depth: (cm) 2.1 Area: (cm) 60.868 Volume: (cm) 127.824 % Reduction in Area: 19.5% % Reduction in Volume: 26.5% Wound Description Classification: Full Thickness Without Exposed Support Structu Exudate Amount: Large Exudate Type: Serosanguineous Exudate Color: red, brown res Treatment Notes Wound #1 (Back) Wound Laterality: Midline Cleanser Peri-Wound Care Topical Primary Dressing Secondary Dressing Secured With Compression Wrap Compression Stockings Add-Ons Electronic Signature(s) Signed: 04/12/2021 1:50:30 PM By: Baruch Gouty RN, BSN Signed: 04/16/2021 8:21:58 AM By: Sandre Kitty Entered By: Sandre Kitty on 04/12/2021 09:14:42 -------------------------------------------------------------------------------- Vitals Details Patient Name: Date of Service: Christian Brown E. 04/12/2021 9:00 A M Medical Record Number: [id number] Patient Account Number: [id number] Date of Birth/Sex: Treating RN: 1966/02/24 (56 y.o. Ernestene Mention Primary Care Delcia Spitzley: [name] Other Clinician: Referring Keiondra Brookover: Treating Charnette Younkin/Extender: [name] Weeks in Treatment: 2 Vital Signs Time Taken: 09:07 Temperature (F): 97.8 Height (in): 74 Pulse (bpm): 116 Respiratory Rate (breaths/min): 20 Blood Pressure (mmHg): 131/85 Reference Range: 80 - 120 mg / dl Electronic Signature(s) Signed: 04/16/2021 8:21:58 AM By: Sandre Kitty Entered By: Sandre Kitty on 04/12/2021 09:08:04

## 2021-04-17 MED ORDER — TIZANIDINE HCL 4 MG PO TABS
4.0000 mg | ORAL_TABLET | Freq: Three times a day (TID) | ORAL | 5 refills | Status: DC
  Filled 2021-07-01: qty 90, 30d supply, fill #0
  Filled 2021-08-04: qty 90, 30d supply, fill #1
  Filled 2021-09-20: qty 90, 30d supply, fill #2
  Filled 2021-10-16: qty 90, 30d supply, fill #3
  Filled 2021-11-25: qty 90, 30d supply, fill #4

## 2021-04-18 ENCOUNTER — Ambulatory Visit (INDEPENDENT_AMBULATORY_CARE_PROVIDER_SITE_OTHER): Payer: No Typology Code available for payment source | Admitting: Plastic Surgery

## 2021-04-18 ENCOUNTER — Other Ambulatory Visit: Payer: Self-pay

## 2021-04-18 DIAGNOSIS — S21209A Unspecified open wound of unspecified back wall of thorax without penetration into thoracic cavity, initial encounter: Secondary | ICD-10-CM

## 2021-04-18 NOTE — Progress Notes (Signed)
° °  Referring Provider Colon Branch, MD [address] [place],  [place] [address]   CC:  Chief Complaint  Patient presents with   Follow-up      [name] is an 56 y.o. male.  HPI: Patient presents for follow-up of a lower back wound.  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  He feels like things are coming along okay.  Wound VAC is being changed at the wound center.  Review of Systems General: Denies fevers and chills  Physical Exam Vitals with BMI 04/11/2021 03/19/2021 03/12/2021  Height - 6\' 2"  -  Weight - 275 lbs -  BMI - 46.21 -  Systolic 947 91 125  Diastolic 86 60 86  Pulse 271 97 108    General:  No acute distress,  Alert and oriented, Non-Toxic, Normal speech and affect Examination shows improvement in his wound.  Is much shallower and smaller than previous.  The entirety of the base looks to be covered by granulation tissue.  Still with some drainage on exam but no surrounding fluctuance and no surrounding erythema.  Assessment/Plan Patient seems to be making progress with wound VAC for his lower back wound.  I think that best case would be to continue with the wound VAC for now.  Ultimately I think this will close with wound VAC alone.  We will have him continue to follow-up with the wound care center and see [name] again in a month or 2.  Cindra Presume 04/18/2021, 12:09 PM

## 2021-04-19 ENCOUNTER — Encounter (HOSPITAL_BASED_OUTPATIENT_CLINIC_OR_DEPARTMENT_OTHER): Payer: 59 | Admitting: Internal Medicine

## 2021-04-22 ENCOUNTER — Encounter (HOSPITAL_BASED_OUTPATIENT_CLINIC_OR_DEPARTMENT_OTHER): Payer: 59 | Admitting: Internal Medicine

## 2021-04-22 ENCOUNTER — Other Ambulatory Visit: Payer: Self-pay

## 2021-04-22 DIAGNOSIS — E11622 Type 2 diabetes mellitus with other skin ulcer: Secondary | ICD-10-CM | POA: Diagnosis not present

## 2021-04-22 DIAGNOSIS — M869 Osteomyelitis, unspecified: Secondary | ICD-10-CM | POA: Diagnosis not present

## 2021-04-22 DIAGNOSIS — L98424 Non-pressure chronic ulcer of back with necrosis of bone: Secondary | ICD-10-CM | POA: Diagnosis not present

## 2021-04-22 NOTE — Progress Notes (Signed)
VENKAT, ANKNEY ([id number]) Visit Report for 04/22/2021 Arrival Information Details Patient Name: Date of Service: [name], [name] 04/22/2021 9:45 A M Medical Record Number: [id number] Patient Account Number: [id number] Date of Birth/Sex: Treating RN: [birth year] (56 y.o. [name] Primary Care [name]: [name] Other Clinician: Referring [name]: Treating [name]/Extender: [name]: 3 Visit Information History Since Last Visit Added or deleted any medications: No Patient Arrived: Wheel Chair Any new allergies or adverse reactions: No Arrival Time: 09:13 Had a fall or experienced change in No Accompanied By: son activities of daily living that may affect Transfer Assistance: None risk of falls: Patient Identification Verified: Yes Signs or symptoms of abuse/neglect since last visito No Secondary Verification Process Completed: Yes Hospitalized since last visit: No Patient Requires Transmission-Based Precautions: No Implantable device outside of the clinic excluding No Patient Has Alerts: Yes cellular tissue based products placed in the center Patient Alerts: Patient on Blood Thinner since last visit: No BP Right Arm-PICC Has Dressing in Place as Prescribed: Yes Pain Present Now: Yes Electronic Signature(s) Signed: 04/22/2021 4:41:04 PM By: Levan Hurst RN, BSN Entered By: Levan Hurst on 04/22/2021 09:14:11 -------------------------------------------------------------------------------- Encounter Discharge Information Details Patient Name: Date of Service: Christian Brown E. 04/22/2021 9:45 A M Medical Record Number: [id number] Patient Account Number: [id number] Date of Birth/Sex: Treating RN: 03-11-1966 (56 y.o. [name] Primary Care Azriel Jakob: [name] Other Clinician: Referring Aleesha Ringstad: Treating Shelby Anderle/Extender: [name]: 3 Encounter Discharge Information Items Discharge  Condition: Stable Ambulatory Status: Wheelchair Discharge Destination: Home Transportation: Private Auto Accompanied By: son Schedule Follow-up Appointment: Yes Clinical Summary of Care: Patient Declined Electronic Signature(s) Signed: 04/22/2021 4:41:04 PM By: Levan Hurst RN, BSN Entered By: Levan Hurst on 04/22/2021 10:33:39 -------------------------------------------------------------------------------- Multi Wound Chart Details Patient Name: Date of Service: Christian Grout. 04/22/2021 9:45 A M Medical Record Number: [id number] Patient Account Number: [id number] Date of Birth/Sex: Treating RN: 05-Jun-1965 (56 y.o. [name] Primary Care Evalee Gerard: [name] Other Clinician: Referring Idonna Heeren: Treating Darria Corvera/Extender: [name]: 3 Vital Signs Height(in): 74 Capillary Blood Glucose(mg/dl): 137 Weight(lbs): Pulse(bpm): 118 Body Mass Index(BMI): Blood Pressure(mmHg): 64/40 Temperature(F): 97.6 Respiratory Rate(breaths/min): 16 Photos: [N/A:N/A] Midline Back N/A N/A Wound Location: Surgical Injury N/A N/A Wounding Event: Open Surgical Wound N/A N/A Primary Etiology: Hypertension, Type II Diabetes, N/A N/A Comorbid History: Osteomyelitis 01/11/2021 N/A N/A Date Acquired: 3 N/A N/A Weeks of Murphy: Open N/A N/A Wound Status: 14x4.5x2.3 N/A N/A Measurements L x W x D (cm) 49.48 N/A N/A A (cm) : rea 113.804 N/A N/A Volume (cm) : 34.50% N/A N/A % Reduction in A rea: 34.50% N/A N/A % Reduction in Volume: 11 Starting Position 1 (o'clock): 2 Ending Position 1 (o'clock): 3.7 Maximum Distance 1 (cm): 4 Starting Position 2 (o'clock): 5 Ending Position 2 (o'clock): 3.4 Maximum Distance 2 (cm): Yes N/A N/A Undermining: Full Thickness Without Exposed N/A N/A Classification: Support Structures Large N/A N/A Exudate Amount: Serosanguineous N/A N/A Exudate Type: red, brown N/A N/A Exudate Color: Well  defined, not attached N/A N/A Wound Margin: Large (67-100%) N/A N/A Granulation Amount: Red, Pink N/A N/A Granulation Quality: Small (1-33%) N/A N/A Necrotic Amount: Fat Layer (Subcutaneous Tissue): Yes N/A N/A Exposed Structures: Fascia: No Tendon: No Muscle: No Joint: No Bone: No Small (1-33%) N/A N/A Epithelialization: Negative Pressure Wound Therapy N/A N/A Procedures Performed: Maintenance (NPWT) Murphy Notes Electronic Signature(s) Signed: 04/22/2021 10:02:14 AM By: Kalman Shan DO  Signed: 04/22/2021 4:35:23 PM By: Fara Chute By: Kalman Shan on 04/22/2021 09:57:58 -------------------------------------------------------------------------------- Multi-Disciplinary Care Plan Details Patient Name: Date of Service: Christian Brown E. 04/22/2021 9:45 A M Medical Record Number: [id number] Patient Account Number: [id number] Date of Birth/Sex: Treating RN: September 15, 1965 (56 y.o. [name] Primary Care Ercell Razon: [name] Other Clinician: Referring Madesyn Ast: Treating Davinder Haff/Extender: [name]: 3 Active Inactive Osteomyelitis Nursing Diagnoses: Infection: osteomyelitis Goals: Patient's osteomyelitis will resolve Date Initiated: 03/28/2021 Target Resolution Date: 05/03/2021 Goal Status: Active Interventions: Assess for signs and symptoms of osteomyelitis resolution every visit Murphy Activities: MRI : 04/02/2021 Systemic antibiotics : 03/28/2021 Notes: Wound/Skin Impairment Nursing Diagnoses: Impaired tissue integrity Goals: Patient/caregiver will verbalize understanding of skin care regimen Date Initiated: 03/28/2021 Target Resolution Date: 05/03/2021 Goal Status: Active Ulcer/skin breakdown will have a volume reduction of 30% by week 4 Date Initiated: 03/28/2021 Target Resolution Date: 05/03/2021 Goal Status: Active Interventions: Assess patient/caregiver ability to obtain necessary  supplies Assess patient/caregiver ability to perform ulcer/skin care regimen upon admission and as needed Assess ulceration(s) every visit Provide education on ulcer and skin care Murphy Activities: Topical wound management initiated : 03/28/2021 Notes: Electronic Signature(s) Signed: 04/22/2021 4:41:04 PM By: Levan Hurst RN, BSN Entered By: Levan Hurst on 04/22/2021 10:17:19 -------------------------------------------------------------------------------- Negative Pressure Wound Therapy Maintenance (NPWT) Details Patient Name: Date of Service: [name], [name] 04/22/2021 9:45 A M Medical Record Number: [id number] Patient Account Number: [id number] Date of Birth/Sex: Treating RN: 16-Dec-1965 (56 y.o. [name] Primary Care Marston Mccadden: [name] Other Clinician: Referring Daeveon Zweber: Treating Dwayna Kentner/Extender: [name]: 3 NPWT Maintenance Performed for: Wound #1 Midline Back Performed By: Levan Hurst, RN Type: VAC System Coverage Size (sq cm): 63 Pressure Type: Constant Pressure Setting: 125 mmHG Drain Type: None Sponge/Dressing Type: Combination : White and black foam Date Initiated: 03/28/2021 Dressing Removed: Yes Quantity of Sponges/Gauze Removed: 2 pieces white foam, 2 pieces black foam Canister Changed: Yes Dressing Reapplied: Yes Quantity of Sponges/Gauze Inserted: 2 pieces white foam, 2 pieces black foam Respones T Murphy: o pt tolerated well Days On NPWT : 26 Post Procedure Diagnosis Same as Pre-procedure Electronic Signature(s) Signed: 04/22/2021 4:41:04 PM By: Levan Hurst RN, BSN Entered By: Levan Hurst on 04/22/2021 09:41:28 -------------------------------------------------------------------------------- Pain Assessment Details Patient Name: Date of Service: Christian Brown E. 04/22/2021 9:45 A M Medical Record Number: [id number] Patient Account Number: [id number] Date of Birth/Sex: Treating RN: 20-Jun-1965  (56 y.o. [name] Primary Care Alanii Ramer: [name] Other Clinician: Referring Jakarie Pember: Treating Zack Crager/Extender: [name]: 3 Active Problems Location of Pain Severity and Description of Pain Patient Has Paino Yes Site Locations Pain Location: Pain Location: Pain in Ulcers With Dressing Change: Yes Duration of the Pain. Constant / Intermittento Intermittent Rate the pain. Current Pain Level: 5 Character of Pain Describe the Pain: Throbbing Pain Management and Medication Current Pain Management: Medication: Yes Cold Application: No Rest: No Massage: No Activity: No T.MurphyN.S.: No Heat Application: No Leg drop or elevation: No Is the Current Pain Management Adequate: Adequate How does your wound impact your activities of daily livingo Sleep: No Bathing: No Appetite: No Relationship With Others: No Bladder Continence: No Emotions: No Bowel Continence: No Work: No Toileting: No Drive: No Dressing: No Hobbies: No Electronic Signature(s) Signed: 04/22/2021 4:41:04 PM By: Levan Hurst RN, BSN Entered By: Levan Hurst on 04/22/2021 09:15:27 -------------------------------------------------------------------------------- Patient/Caregiver Education Details Patient Name: Date of Service: [name], Christian [name].  1/9/2023andnbsp9:45 A M Medical Record Number: [id number] Patient Account Number: [id number] Date of Birth/Gender: Treating RN: 1965-08-12 (56 y.o. [name] Primary Care Physician: [name] Other Clinician: Referring Physician: Treating Physician/Extender: [name] in Murphy: 3 Education Assessment Education Provided To: Patient Education Topics Provided Wound/Skin Impairment: Methods: Explain/Verbal Responses: State content correctly Electronic Signature(s) Signed: 04/22/2021 4:41:04 PM By: Levan Hurst RN, BSN Entered By: Levan Hurst on 04/22/2021  10:17:33 -------------------------------------------------------------------------------- Wound Assessment Details Patient Name: Date of Service: Christian Brown E. 04/22/2021 9:45 A M Medical Record Number: [id number] Patient Account Number: [id number] Date of Birth/Sex: Treating RN: [birth year] (56 y.o. [name] Primary Care Jaxton Casale: [name] Other Clinician: Referring Roxana Lai: Treating Kasem Mozer/Extender: [name]: 3 Wound Status Wound Number: 1 Primary Etiology: Open Surgical Wound Wound Location: Midline Back Wound Status: Open Wounding Event: Surgical Injury Comorbid History: Hypertension, Type II Diabetes, Osteomyelitis Date Acquired: 01/11/2021 Weeks Of Murphy: 3 Clustered Wound: No Photos Wound Measurements Length: (cm) 14 Width: (cm) 4.5 Depth: (cm) 2.3 Area: (cm) 49.48 Volume: (cm) 113.804 % Reduction in Area: 34.5% % Reduction in Volume: 34.5% Epithelialization: Small (1-33%) Tunneling: No Undermining: Yes Location 1 Starting Position (o'clock): 11 Ending Position (o'clock): 2 Maximum Distance: (cm) 3.7 Location 2 Starting Position (o'clock): 4 Ending Position (o'clock): 5 Maximum Distance: (cm) 3.4 Wound Description Classification: Full Thickness Without Exposed Support Structures Wound Margin: Well defined, not attached Exudate Amount: Large Exudate Type: Serosanguineous Exudate Color: red, brown Foul Odor After Cleansing: No Slough/Fibrino Yes Wound Bed Granulation Amount: Large (67-100%) Exposed Structure Granulation Quality: Red, Pink Fascia Exposed: No Necrotic Amount: Small (1-33%) Fat Layer (Subcutaneous Tissue) Exposed: Yes Necrotic Quality: Adherent Slough Tendon Exposed: No Muscle Exposed: No Joint Exposed: No Bone Exposed: No Assessment Notes Large rash noted on right distal back, from irritation from wound vac foam being placed directly on skin during last dressing change at Pl[facility]  Surgeon office. Electronic Signature(s) Signed: 04/22/2021 4:41:04 PM By: Levan Hurst RN, BSN Entered By: Levan Hurst on 04/22/2021 10:04:15 -------------------------------------------------------------------------------- [place] Details Patient Name: Date of Service: Christian Brown E. 04/22/2021 9:45 A M Medical Record Number: [id number] Patient Account Number: [id number] Date of Birth/Sex: Treating RN: 18-Mar-1966 (56 y.o. [name] Primary Care Candido Flott: [name] Other Clinician: Referring Kimberlin Scheel: Treating Telesia Ates/Extender: [name]: 3 Vital Signs Time Taken: 09:14 Temperature (F): 97.6 Height (in): 74 Pulse (bpm): 118 Respiratory Rate (breaths/min): 16 Blood Pressure (mmHg): 64/40 Capillary Blood Glucose (mg/dl): 137 Reference Range: 80 - 120 mg / dl Notes Initial BP reading 64/41, manual reading 64/40. Patient asymptomatic, denies any lightheadedness, headache, changes in vision, etc., states he last took his BP medication yesterday AM. Dr. Heber Rainbow notified of BP. Electronic Signature(s) Signed: 04/22/2021 4:41:04 PM By: Levan Hurst RN, BSN Entered By: Levan Hurst on 04/22/2021 09:37:44

## 2021-04-22 NOTE — Progress Notes (Signed)
[name], [name] ([id number]) Visit Report for 04/22/2021 Chief Complaint Document Details Patient Name: Date of Service: [name] 04/22/2021 9:45 A M Medical Record Number: [id number] Patient Account Number: [id number] Date of Birth/Sex: Treating RN: [birth year] (56 y.o. [name] Primary Care Provider: Kathlene Murphy Other Clinician: Referring Provider: Treating Provider/Extender: [name] in Treatment: 3 Information Obtained from: Patient Chief Complaint Back wound Electronic Signature(s) Signed: 04/22/2021 10:02:14 AM By: Christian Shan DO Entered By: [name] on 04/22/2021 09:58:07 -------------------------------------------------------------------------------- HPI Details Patient Name: Date of Service: Christian Brown E. 04/22/2021 9:45 A M Medical Record Number: [id number] Patient Account Number: [id number] Date of Birth/Sex: Treating RN: May 31, 1965 (56 y.o. [name] Primary Care Provider: Kathlene Murphy Other Clinician: Referring Provider: Treating Provider/Extender: [name] in Treatment: 3 History of Present Illness HPI Description: Admission 03/28/2021 [name] is a 56 year old male with a past medical history of controlled type 2 diabetes on oral agents, obesity and OSA that presents to the clinic for a back wound. On 01/11/2021 patient had a laminectomy with PLIF of L1-S1 by [name] because of lumbar stenosis and radiculopathy. He subsequently developed bacteremia. He had CT imaging on 10/13 of the lumbar spine that showed fluid collection in the soft tissue of the posterior L1 and S1 and was taken to the OR for washout on 10/14. 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 He received 4 Murphy of IV antibiotics by infectious disease. After his completion of 4 Murphy of IV antibiotics he was continued for an additional 4 Murphy of IV cefazolin with a stop date of 12/29. He has  been evaluated by plastic surgery and no plans for surgical intervention at this time. [name] is present and reports he has been on the wound VAC for the past 8 Murphy with improvement in wound healing. He currently denies systemic signs of infection. 12/22; patient presents for follow-up. He reports no issues since last clinic visit. He denies signs of infection. He has been tolerating the wound VAC well. 12/30; patient presents for follow-up. He reports no issues and has no complaints today. He has been tolerating the wound VAC well. 1/9; patient presents for follow-up. He has no issues or complaints today. He states he feels well. He has had no problems with the wound VAC. Electronic Signature(s) Signed: 04/22/2021 10:02:14 AM By: Christian Shan DO Entered By: [name] on 04/22/2021 09:58:35 -------------------------------------------------------------------------------- Physical Exam Details Patient Name: Date of Service: Christian Brown E. 04/22/2021 9:45 A M Medical Record Number: [id number] Patient Account Number: [id number] Date of Birth/Sex: Treating RN: 1965/05/15 (56 y.o. [name] Primary Care Provider: Kathlene Murphy Other Clinician: Referring Provider: Treating Provider/Extender: [name] in Treatment: 3 Constitutional respirations regular, non-labored and within target range for patient.Marland Kitchen Psychiatric pleasant and cooperative. Notes Large open wound to the back. Muscle exposed. Granulation tissue throughout. There is undermining and tunneling. No probing to bone. No signs of infection. Electronic Signature(s) Signed: 04/22/2021 10:02:14 AM By: Christian Shan DO Entered By: [name] on 04/22/2021 09:58:58 -------------------------------------------------------------------------------- Physician Orders Details Patient Name: Date of Service: Christian Brown E. 04/22/2021 9:45 A M Medical Record Number: [id number] Patient Account Number:  [id number] Date of Birth/Sex: Treating RN: 1966/03/31 (56 y.o. 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 - Monday with [name] Return A Nurse Visit: - Thursday for vac change - ***60 minutes for vac change*** Bathing/ Shower/ Hygiene May shower with protection but do not get wound dressing(s) wet. Negative Presssure Wound Therapy Wound Vac to wound continuously at 132mm/hg pressure Black Foam - wound base then bridge to right side White Foam - T tunnel at 12:00 and undermining from 4:00-6:00 o Additional Orders / Instructions Follow Nutritious Diet - Continue to monitor blood sugars Wound Treatment Electronic Signature(s) Signed: 04/22/2021 10:02:14 AM By: Christian Shan DO Signed: 04/22/2021 10:02:14 AM By: Christian Shan DO Entered By: [name] on 04/22/2021 09:59:11 -------------------------------------------------------------------------------- Problem List Details Patient Name: Date of Service: Christian Brown E. 04/22/2021 9:45 A M Medical Record Number: [id number] Patient Account Number: [id number] Date of Birth/Sex: Treating RN: 09-17-1965 (56 y.o. 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 04/22/2021 9:45 A M Medical Record Number: [id number] Patient Account Number: [id number] Date of Birth/Sex: Treating RN: 1965/12/20 (56 y.o. [name] Primary Care Provider: Kathlene Murphy Other Clinician: Referring Provider: Treating Provider/Extender: [name] in Treatment: 3 Subjective Chief Complaint Information obtained from Patient Back wound History of Present Illness (HPI) Admission 03/28/2021 Mr. Deven Furia is a 56 year old male with a past medical history of controlled type 2 diabetes on oral agents, obesity and OSA that presents to the clinic for a back wound. On 01/11/2021 patient had a laminectomy with PLIF of L1-S1 by [name] because of lumbar stenosis and radiculopathy. He subsequently developed bacteremia. He had CT imaging on 10/13 of the lumbar spine that showed fluid collection in the soft tissue of the posterior L1 and S1 and was taken to the OR for washout on 10/14. 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 He received 4 Murphy of IV antibiotics by infectious disease. After his completion of 4 Murphy of IV antibiotics he was continued for an additional 4 Murphy of IV cefazolin with a stop date of 12/29. He has been evaluated by plastic surgery and no plans for surgical intervention at this time. [name] is present and reports he has been on the wound VAC for the past 8 Murphy with improvement in wound healing. He currently denies systemic signs of infection. 12/22; patient presents for follow-up. He reports no issues since last clinic visit. He denies signs of infection. He has  been tolerating the wound VAC well. 12/30; patient presents for follow-up. He reports no issues and has no complaints today. He has been tolerating the wound VAC well. 1/9; patient presents for follow-up. He has no issues or complaints today. He states he feels well. He has had no problems with the wound VAC. Patient History Information obtained from Patient. Family History Diabetes - Mother, Stroke - Siblings, No family  history of Cancer, Heart Disease, Hereditary Spherocytosis, Hypertension, Kidney Disease, Lung Disease, Seizures, Thyroid Problems, Tuberculosis. Social History Never smoker, Marital Status - Married, Alcohol Use - Rarely, Drug Use - No History, Caffeine Use - Rarely. Medical History Cardiovascular Patient has history of Hypertension Endocrine Patient has history of Type II Diabetes Musculoskeletal Patient has history of Osteomyelitis Medical A Surgical History Notes nd Musculoskeletal DDD Objective Constitutional respirations regular, non-labored and within target range for patient.. Vitals Time Taken: 9:14 AM, Height: 74 in, Temperature: 97.6 F, Pulse: 118 bpm, Respiratory Rate: 16 breaths/min, Blood Pressure: 64/40 mmHg, Capillary Blood Glucose: 137 mg/dl. General Notes: Initial BP reading 64/41, manual reading 64/40. Patient asymptomatic, denies any lightheadedness, headache, changes in vision, etc., states he last took his BP medication yesterday AM. Dr. Heber Wilson notified of BP. Psychiatric pleasant and cooperative. General Notes: Large open wound to the back. Muscle exposed. Granulation tissue throughout. There is undermining and tunneling. No probing to bone. No signs of infection. Integumentary (Hair, Skin) Wound #1 status is Open. Original cause of wound was Surgical Injury. The date acquired was: 01/11/2021. The wound has been in treatment 3 Murphy. The wound is located on the Midline Back. The wound measures 14cm length x 4.5cm width x 2.3cm depth;  49.48cm^2 area and 113.804cm^3 volume. There is Fat Layer (Subcutaneous Tissue) exposed. There is no tunneling noted, however, there is undermining starting at 11:00 and ending at 2:00 with a maximum distance of 3.7cm. There is additional undermining and at 4:00 and ending at 5:00 with a maximum distance of 3.4cm. There is a large amount of serosanguineous drainage noted. The wound margin is well defined and not attached to the wound base. There is large (67-100%) red, pink granulation within the wound bed. There is a small (1-33%) amount of necrotic tissue within the wound bed including Adherent Slough. 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 No signs of infection on exam. His blood pressure was running low today but he states that he feels well. His heart rate was also high in the 110s but has been running high every visit. I recommended that if he were to feel unwell he needs to go to the ED immediately. He denies shortness of breath, n/v, fever/chills, chest pain, headaches or vision changes. Follow-up in 1 week. Plan Follow-up Appointments: Return Appointment in 1 week. - Monday with Dr. Heber C[place] Nurse Visit: - Thursday for vac change - ***60 minutes for vac change*** Bathing/ Shower/ Hygiene: May shower with protection but do not get wound dressing(s) wet. Negative Presssure Wound Therapy: Wound Vac to wound continuously at 183mm/hg pressure Black Foam - wound base then bridge to right side White Foam - T tunnel at 12:00 and undermining from 4:00-6:00 o Additional Orders / Instructions: Follow Nutritious Diet - Continue to monitor blood sugars 1. Continue wound VAC Electronic Signature(s) Signed: 04/22/2021 10:02:14 AM By: Christian Shan DO Entered By: [name] on 04/22/2021  10:01:03 -------------------------------------------------------------------------------- HxROS Details Patient Name: Date of Service: Christian Brown E. 04/22/2021 9:45 A M Medical Record Number: [id number] Patient Account Number: [id number] Date of Birth/Sex: Treating RN: 1966-03-04 (56 y.o. [name] Primary Care Provider: Kathlene Murphy Other Clinician: Referring Provider: Treating Provider/Extender: [name] in Treatment: 3 Information Obtained From Patient Cardiovascular Medical History: Positive for: Hypertension Endocrine Medical History: Positive for: Type II Diabetes Time with  diabetes: Since mid 90's Treated with: Oral agents Blood sugar tested every day: Yes Tested : 2x day Musculoskeletal Medical History: Positive for: Osteomyelitis Past Medical History Notes: DDD Immunizations Pneumococcal Vaccine: Received Pneumococcal Vaccination: Yes Received Pneumococcal Vaccination On or After 60th Birthday: No Implantable Devices Yes Family and Social History Cancer: No; Diabetes: Yes - Mother; Heart Disease: No; Hereditary Spherocytosis: No; Hypertension: No; Kidney Disease: No; Lung Disease: No; Seizures: No; Stroke: Yes - Siblings; Thyroid Problems: No; Tuberculosis: No; Never smoker; Marital Status - Married; Alcohol Use: Rarely; Drug Use: No History; Caffeine Use: Rarely; Financial Concerns: No; Food, Clothing or Shelter Needs: No; Support System Lacking: No; Transportation Concerns: No Electronic Signature(s) Signed: 04/22/2021 10:02:14 AM By: Christian Shan DO Signed: 04/22/2021 4:35:23 PM By: Lorrin Jackson Entered By: [name] on 04/22/2021 09:58:40 -------------------------------------------------------------------------------- SuperBill Details Patient Name: Date of Service: [name] 04/22/2021 Medical Record Number: [id number] Patient Account Number: [id number] Date of Birth/Sex: Treating RN: 1966/01/21 (56 y.o. 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

## 2021-04-25 ENCOUNTER — Encounter (HOSPITAL_BASED_OUTPATIENT_CLINIC_OR_DEPARTMENT_OTHER): Payer: 59 | Admitting: Internal Medicine

## 2021-04-25 ENCOUNTER — Other Ambulatory Visit: Payer: Self-pay

## 2021-04-25 DIAGNOSIS — L98424 Non-pressure chronic ulcer of back with necrosis of bone: Secondary | ICD-10-CM | POA: Diagnosis not present

## 2021-04-26 NOTE — Progress Notes (Signed)
[name], [name] ([id number]) Visit Report for 04/25/2021 Arrival Information Details Patient Name: Date of Service: OSHER, OETTINGER 04/25/2021 9:30 A M Medical Record Number: [id number] Patient Account Number: [id number] Date of Birth/Sex: Treating RN: 1965/05/18 (56 y.o. [name] Primary Care Craige Patel: [name] Other Clinician: Referring Janzen Sacks: Treating Esley Brooking/Extender: [name] Weeks in Treatment: 4 Visit Information History Since Last Visit Added or deleted any medications: No Patient Arrived: Wheel Chair Any new allergies or adverse reactions: No Arrival Time: 09:36 Had a fall or experienced change in No Accompanied By: alone activities of daily living that may affect Transfer Assistance: None risk of falls: Patient Identification Verified: Yes Signs or symptoms of abuse/neglect since last visito No Secondary Verification Process Completed: Yes Hospitalized since last visit: No Patient Requires Transmission-Based Precautions: No Implantable device outside of the clinic excluding No Patient Has Alerts: Yes cellular tissue based products placed in the center Patient Alerts: Patient on Blood Thinner since last visit: No BP Right Arm-PICC Has Dressing in Place as Prescribed: Yes Pain Present Now: No Electronic Signature(s) Signed: 04/25/2021 6:02:53 PM By: Levan Hurst RN, BSN Entered By: Levan Hurst on 04/25/2021 16:21:03 -------------------------------------------------------------------------------- Encounter Discharge Information Details Patient Name: Date of Service: Christian Brown E. 04/25/2021 9:30 A M Medical Record Number: [id number] Patient Account Number: [id number] Date of Birth/Sex: Treating RN: Feb 15, 1966 (56 y.o. [name] Primary Care Julie Nay: [name] Other Clinician: Referring Sartaj Hoskin: Treating Mikalah Skyles/Extender: [name] Weeks in Treatment: 4 Encounter Discharge Information Items Discharge  Condition: Stable Ambulatory Status: Wheelchair Discharge Destination: Home Transportation: Private Auto Accompanied By: son Schedule Follow-up Appointment: Yes Clinical Summary of Care: Patient Declined Electronic Signature(s) Signed: 04/25/2021 6:02:53 PM By: Levan Hurst RN, BSN Entered By: Levan Hurst on 04/25/2021 16:28:04 -------------------------------------------------------------------------------- Negative Pressure Wound Therapy Maintenance (NPWT) Details Patient Name: Date of Service: [name], [name] 04/25/2021 9:30 A M Medical Record Number: [id number] Patient Account Number: [id number] Date of Birth/Sex: Treating RN: 27-Jun-1965 (56 y.o. [name] Primary Care Merit Maybee: [name] Other Clinician: Referring Kilani Joffe: Treating Kamarri Lovvorn/Extender: [name] Weeks in Treatment: 4 NPWT Maintenance Performed for: Wound #1 Midline Back Performed By: Levan Hurst, RN Type: VAC System Coverage Size (sq cm): 63 Pressure Type: Constant Pressure Setting: 125 mmHG Drain Type: None Sponge/Dressing Type: Combination : white and foam Date Initiated: 03/28/2021 Dressing Removed: Yes Quantity of Sponges/Gauze Removed: 2 pieces white foam, 2 pieces black foam Canister Changed: No Dressing Reapplied: Yes Quantity of Sponges/Gauze Inserted: 2 pieces white foam, 2 pieces black foam Respones T Treatment: o pt tolerated well Days On NPWT : 29 Electronic Signature(s) Signed: 04/25/2021 6:02:53 PM By: Levan Hurst RN, BSN Entered By: Levan Hurst on 04/25/2021 16:25:42 -------------------------------------------------------------------------------- Patient/Caregiver Education Details Patient Name: Date of Service: [name] 1/12/2023andnbsp9:30 [place] Record Number: [id number] Patient Account Number: [id number] Date of Birth/Gender: Treating RN: 01-18-1966 (56 y.o. Collene Gobble Primary Care Physician: [name] Other  Clinician: Referring Physician: Treating Physician/Extender: [name] in Treatment: 4 Education Assessment Education Provided To: Patient Education Topics Provided Wound/Skin Impairment: Methods: Explain/Verbal Responses: Return demonstration correctly Electronic Signature(s) Signed: 04/26/2021 1:28:52 PM By: Dellie Catholic RN Entered By: Dellie Catholic on 04/25/2021 18:01:31 -------------------------------------------------------------------------------- Wound Assessment Details Patient Name: Date of Service: [name], [name] 04/25/2021 9:30 A M Medical Record Number: [id number] Patient Account Number: [id number] Date of Birth/Sex: Treating RN: [birth year] (56 y.o. [name] Primary Care Candiss Galeana: [name] Other  Clinician: Referring Kendell Gammon: Treating Zayden Maffei/Extender: [name] Weeks in Treatment: 4 Wound Status Wound Number: 1 Primary Etiology: Open Surgical Wound Wound Location: Midline Back Wound Status: Open Wounding Event: Surgical Injury Comorbid History: Hypertension, Type II Diabetes, Osteomyelitis Date Acquired: 01/11/2021 Weeks Of Treatment: 4 Clustered Wound: No Wound Measurements Length: (cm) 14 Width: (cm) 4.5 Depth: (cm) 2.3 Area: (cm) 49.48 Volume: (cm) 113.804 % Reduction in Area: 34.5% % Reduction in Volume: 34.5% Epithelialization: Small (1-33%) Tunneling: No Undermining: Yes Location 1 Starting Position (o'clock): 11 Ending Position (o'clock): 2 Maximum Distance: (cm) 3.7 Location 2 Starting Position (o'clock): 4 Ending Position (o'clock): 5 Maximum Distance: (cm) 3.4 Wound Description Classification: Full Thickness Without Exposed Support Structures Wound Margin: Well defined, not attached Exudate Amount: Large Exudate Type: Serosanguineous Exudate Color: red, brown Foul Odor After Cleansing: No Slough/Fibrino Yes Wound Bed Granulation Amount: Large (67-100%) Exposed  Structure Granulation Quality: Red, Pink Fascia Exposed: No Necrotic Amount: Small (1-33%) Fat Layer (Subcutaneous Tissue) Exposed: Yes Necrotic Quality: Adherent Slough Tendon Exposed: No Muscle Exposed: No Joint Exposed: No Bone Exposed: No Electronic Signature(s) Signed: 04/25/2021 6:02:53 PM By: Levan Hurst RN, BSN Entered By: Levan Hurst on 04/25/2021 16:23:44 -------------------------------------------------------------------------------- Vitals Details Patient Name: Date of Service: Christian Brown E. 04/25/2021 9:30 A M Medical Record Number: [id number] Patient Account Number: [id number] Date of Birth/Sex: Treating RN: March 09, 1966 (56 y.o. [name] Primary Care Luzmaria Devaux: [name] Other Clinician: Referring Lakin Romer: Treating Joon Pohle/Extender: [name] Weeks in Treatment: 4 Vital Signs Time Taken: 09:36 Temperature (F): 98.5 Height (in): 74 Pulse (bpm): 109 Respiratory Rate (breaths/min): 18 Blood Pressure (mmHg): 98/63 Capillary Blood Glucose (mg/dl): 190 Reference Range: 80 - 120 mg / dl Notes glucose per pt report Electronic Signature(s) Signed: 04/25/2021 6:02:53 PM By: Levan Hurst RN, BSN Entered By: Levan Hurst on 04/25/2021 16:23:13

## 2021-04-26 NOTE — Progress Notes (Signed)
ROBBEN, JAGIELLO ([id number]) Visit Report for 04/25/2021 SuperBill Details Patient Name: Date of Service: BENSYN, BORNEMANN 04/25/2021 Medical Record Number: [id number] Patient Account Number: [id number] Date of Birth/Sex: Treating RN: 11/24/1965 (56 y.o. 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

## 2021-04-29 ENCOUNTER — Encounter (HOSPITAL_BASED_OUTPATIENT_CLINIC_OR_DEPARTMENT_OTHER): Payer: 59 | Admitting: Internal Medicine

## 2021-04-29 ENCOUNTER — Other Ambulatory Visit: Payer: Self-pay

## 2021-04-29 DIAGNOSIS — M869 Osteomyelitis, unspecified: Secondary | ICD-10-CM | POA: Diagnosis not present

## 2021-04-29 DIAGNOSIS — L98424 Non-pressure chronic ulcer of back with necrosis of bone: Secondary | ICD-10-CM

## 2021-04-29 DIAGNOSIS — E11622 Type 2 diabetes mellitus with other skin ulcer: Secondary | ICD-10-CM

## 2021-04-30 ENCOUNTER — Ambulatory Visit (INDEPENDENT_AMBULATORY_CARE_PROVIDER_SITE_OTHER): Payer: No Typology Code available for payment source | Admitting: Infectious Diseases

## 2021-04-30 ENCOUNTER — Other Ambulatory Visit: Payer: Self-pay | Admitting: Infectious Diseases

## 2021-04-30 ENCOUNTER — Encounter: Payer: Self-pay | Admitting: Infectious Diseases

## 2021-04-30 ENCOUNTER — Telehealth: Payer: Self-pay

## 2021-04-30 ENCOUNTER — Other Ambulatory Visit: Payer: Self-pay

## 2021-04-30 VITALS — BP 135/81 | HR 103 | Temp 98.1°F

## 2021-04-30 DIAGNOSIS — T84498A Other mechanical complication of other internal orthopedic devices, implants and grafts, initial encounter: Secondary | ICD-10-CM

## 2021-04-30 DIAGNOSIS — Z5181 Encounter for therapeutic drug level monitoring: Secondary | ICD-10-CM | POA: Diagnosis not present

## 2021-04-30 DIAGNOSIS — T847XXD Infection and inflammatory reaction due to other internal orthopedic prosthetic devices, implants and grafts, subsequent encounter: Secondary | ICD-10-CM | POA: Diagnosis not present

## 2021-04-30 DIAGNOSIS — E118 Type 2 diabetes mellitus with unspecified complications: Secondary | ICD-10-CM

## 2021-04-30 MED ORDER — CEFADROXIL 1 G PO TABS
1.0000 g | ORAL_TABLET | Freq: Two times a day (BID) | ORAL | 2 refills | Status: DC
  Filled 2021-05-31: qty 60, 30d supply, fill #0

## 2021-04-30 NOTE — Telephone Encounter (Signed)
Spoke to [name] at [facility] - requested to speak with pharmacist to give verbal orders to remove patient PICC line after last dose of Cefazolin today (04/30/2021) - per Dr.Manandhar.  [name] stated that there wasn't a pharmacist in the office right now and asked if I could fax over orders. Orders signed by MD and faxed to [facility].   [place], CMA

## 2021-04-30 NOTE — Progress Notes (Signed)
[name], [name] ([id number]) Visit Report for 04/29/2021 Chief Complaint Document Details Patient Name: Date of Service: [name], [name] 04/29/2021 9:45 A M Medical Record Number: [id number] Patient Account Number: [id number] Date of Birth/Sex: Treating RN: 1966/04/07 (56 y.o. [name] Primary Care Provider: [name] Other Clinician: Referring Provider: Treating Provider/Extender: [name] Weeks in Treatment: 4 Information Obtained from: Patient Chief Complaint Back wound Electronic Signature(s) Signed: 04/29/2021 11:54:18 AM By: Kalman Shan DO Entered By: Kalman Shan on 04/29/2021 11:35:07 -------------------------------------------------------------------------------- HPI Details Patient Name: Date of Service: Christian Brown E. 04/29/2021 9:45 A M Medical Record Number: [id number] Patient Account Number: [id number] Date of Birth/Sex: Treating RN: Jan 28, 1966 (56 y.o. [name] Primary Care Provider: [name] Other Clinician: Referring Provider: Treating Provider/Extender: [name] Weeks in Treatment: 4 History of Present Illness HPI Description: Admission 03/28/2021 [name] is a 56 year old male with a past medical history of controlled type 2 diabetes on oral agents, obesity and OSA that presents to the clinic for a back wound. On 01/11/2021 patient had a laminectomy with PLIF of L1-S1 by Dr. Venetia Constable because of lumbar stenosis and radiculopathy. He subsequently developed bacteremia. He had CT imaging on 10/13 of the lumbar spine that showed fluid collection in the soft tissue of the posterior L1 and S1 and was taken to the OR for washout on 10/14. 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 He received 4 weeks of IV antibiotics by infectious disease. After his completion of 4 weeks of IV antibiotics he was continued for an additional 4 weeks of IV cefazolin with a stop date of 12/29. He has  been evaluated by plastic surgery and no plans for surgical intervention at this time. Wife is present and reports he has been on the wound VAC for the past 8 weeks with improvement in wound healing. He currently denies systemic signs of infection. 12/22; patient presents for follow-up. He reports no issues since last clinic visit. He denies signs of infection. He has been tolerating the wound VAC well. 12/30; patient presents for follow-up. He reports no issues and has no complaints today. He has been tolerating the wound VAC well. 1/9; patient presents for follow-up. He has no issues or complaints today. He states he feels well. He has had no problems with the wound VAC. 1/16; patient presents for follow-up. He continues to use the wound VAC with no issues. He denies signs of infection. Electronic Signature(s) Signed: 04/29/2021 11:54:18 AM By: Kalman Shan DO Entered By: Kalman Shan on 04/29/2021 11:47:06 -------------------------------------------------------------------------------- Physical Exam Details Patient Name: Date of Service: [name], [name] 04/29/2021 9:45 A M Medical Record Number: [id number] Patient Account Number: [id number] Date of Birth/Sex: Treating RN: 15-Oct-1965 (56 y.o. [name] Primary Care Provider: [name] Other Clinician: Referring Provider: Treating Provider/Extender: [name] Weeks in Treatment: 4 Constitutional respirations regular, non-labored and within target range for patient.Marland Kitchen Psychiatric pleasant and cooperative. Notes Large open wound to the back. Granulation tissue throughout. There is undermining and tunneling. No probing to bone. No signs of infection. Electronic Signature(s) Signed: 04/29/2021 11:54:18 AM By: Kalman Shan DO Entered By: Kalman Shan on 04/29/2021 11:47:49 -------------------------------------------------------------------------------- Physician Orders Details Patient Name: Date of  Service: Christian Brown E. 04/29/2021 9:45 A M Medical Record Number: [id number] Patient Account Number: [id number] Date of Birth/Sex: Treating RN: [birth year] (56 y.o. 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 - Monday with Dr. Heber [place] Return A Nurse Visit: - Thursday for vac change - ***60 minutes for vac change*** Bathing/ Shower/ Hygiene May shower with protection but do not get wound dressing(s) wet. Negative Presssure Wound Therapy Wound Vac to wound continuously at 174mm/hg pressure Black Foam - wound base then bridge to right side White Foam - T tunnel at 12:00 o Additional Orders / Instructions Follow Nutritious Diet - Continue to monitor blood sugars Wound Treatment Electronic Signature(s) Signed: 04/29/2021 11:54:18 AM By: Kalman Shan DO Signed: 04/29/2021 11:54:18 AM By: Kalman Shan DO Entered By: Kalman Shan on 04/29/2021 11:48:06 -------------------------------------------------------------------------------- Problem List Details Patient Name: Date of Service: Christian Brown E. 04/29/2021 9:45 A M Medical Record Number: [id number] Patient Account Number: [id number] Date of Birth/Sex: Treating RN: 1966/02/27 (56 y.o. 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 04/29/2021 9:45 A M Medical Record Number: [id number] Patient Account Number: [id number] Date of Birth/Sex: Treating RN: 02-12-1966 (56 y.o. [name] Primary Care Provider: [name] Other Clinician: Referring Provider: Treating Provider/Extender: [name] Weeks in Treatment: 4 Subjective Chief Complaint Information obtained from Patient Back wound History of Present Illness (HPI) Admission 03/28/2021 [name] is a 56 year old male with a past medical history of controlled type 2 diabetes on oral agents, obesity and OSA that presents to the clinic for a back wound. On 01/11/2021 patient had a laminectomy with PLIF of L1-S1 by Dr. Venetia Constable because of lumbar stenosis and radiculopathy. He subsequently developed bacteremia. He had CT imaging on 10/13 of the lumbar spine that showed fluid collection in the soft tissue of the posterior L1 and S1 and was taken to the OR for washout on 10/14. 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 He received 4 weeks of IV antibiotics by infectious disease. After his completion of 4 weeks of IV antibiotics he was continued for an additional 4 weeks of IV cefazolin with a stop date of 12/29. He has been evaluated by plastic surgery and no plans for surgical intervention at this time. Wife is present and reports he has been on the wound VAC for the past 8 weeks with improvement in wound healing. He currently denies systemic signs of infection. 12/22; patient presents for follow-up. He  reports no issues since last clinic visit. He denies signs of infection. He has been tolerating the wound VAC well. 12/30; patient presents for follow-up. He reports no issues and has no complaints today. He has been tolerating the wound VAC well. 1/9; patient presents for follow-up. He has no issues or complaints today. He states he feels well. He has had no problems with the wound VAC. 1/16; patient  presents for follow-up. He continues to use the wound VAC with no issues. He denies signs of infection. Patient History Information obtained from Patient. Family History Diabetes - Mother, Stroke - Siblings, No family history of Cancer, Heart Disease, Hereditary Spherocytosis, Hypertension, Kidney Disease, Lung Disease, Seizures, Thyroid Problems, Tuberculosis. Social History Never smoker, Marital Status - Married, Alcohol Use - Rarely, Drug Use - No History, Caffeine Use - Rarely. Medical History Cardiovascular Patient has history of Hypertension Endocrine Patient has history of Type II Diabetes Musculoskeletal Patient has history of Osteomyelitis Medical A Surgical History Notes nd Musculoskeletal DDD Objective Constitutional respirations regular, non-labored and within target range for patient.. Vitals Time Taken: 9:51 AM, Height: 74 in, Temperature: 98 F, Pulse: 109 bpm, Respiratory Rate: 18 breaths/min, Blood Pressure: 76/48 mmHg. Psychiatric pleasant and cooperative. General Notes: Large open wound to the back. Granulation tissue throughout. There is undermining and tunneling. No probing to bone. No signs of infection. Integumentary (Hair, Skin) Wound #1 status is Open. Original cause of wound was Surgical Injury. The date acquired was: 01/11/2021. The wound has been in treatment 4 weeks. The wound is located on the Midline Back. The wound measures 13.5cm length x 4.7cm width x 2cm depth; 49.834cm^2 area and 99.667cm^3 volume. There is Fat Layer (Subcutaneous Tissue) exposed.  There is no undermining noted, however, there is tunneling at 12:00 with a maximum distance of 3.8cm. There is a large amount of serosanguineous drainage noted. The wound margin is well defined and not attached to the wound base. There is large (67-100%) red, pink granulation within the wound bed. There is a small (1-33%) amount of necrotic tissue within the wound bed including Adherent Slough. 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 No surrounding signs of infection. I recommended continuing the wound VAC. Plan Follow-up Appointments: Return Appointment in 1 week. - Monday with Dr. Heber [place] Nurse Visit: - Thursday for vac change - ***60 minutes for vac change*** Bathing/ Shower/ Hygiene: May shower with protection but do not get wound dressing(s) wet. Negative Presssure Wound Therapy: Wound Vac to wound continuously at 161mm/hg pressure Black Foam - wound base then bridge to right side White Foam - T tunnel at 12:00 o Additional Orders / Instructions: Follow Nutritious Diet - Continue to monitor blood sugars 1. Continue wound VAC Electronic Signature(s) Signed: 04/29/2021 11:54:18 AM By: Kalman Shan DO Entered By: Kalman Shan on 04/29/2021 11:52:19 -------------------------------------------------------------------------------- HxROS Details Patient Name: Date of Service: Christian Brown E. 04/29/2021 9:45 A M Medical Record Number: [id number] Patient Account Number: [id number] Date of Birth/Sex: Treating RN: 1965/10/19 (56 y.o. [name] Primary Care Provider: [name] Other Clinician: Referring Provider: Treating Provider/Extender: [name] Weeks in Treatment: 4 Information Obtained From Patient Cardiovascular Medical  History: Positive for: Hypertension Endocrine Medical History: Positive for: Type II Diabetes Time with diabetes: Since mid 90's Treated with: Oral agents Blood sugar tested every day: Yes Tested : 2x day Musculoskeletal Medical History: Positive for: Osteomyelitis Past Medical History Notes: DDD Immunizations Pneumococcal Vaccine: Received Pneumococcal Vaccination: Yes Received Pneumococcal Vaccination On or After 60th Birthday: No Implantable Devices Yes Family and Social History Cancer: No; Diabetes: Yes - Mother; Heart Disease: No; Hereditary Spherocytosis: No; Hypertension: No; Kidney Disease: No; Lung Disease: No; Seizures: No; Stroke: Yes - Siblings; Thyroid Problems: No; Tuberculosis: No; Never smoker; Marital  Status - Married; Alcohol Use: Rarely; Drug Use: No History; Caffeine Use: Rarely; Financial Concerns: No; Food, Clothing or Shelter Needs: No; Support System Lacking: No; Transportation Concerns: No Electronic Signature(s) Signed: 04/29/2021 11:54:18 AM By: Kalman Shan DO Signed: 04/30/2021 3:58:04 PM By: Lorrin Jackson Entered By: Kalman Shan on 04/29/2021 11:47:14 -------------------------------------------------------------------------------- SuperBill Details Patient Name: Date of Service: Christian Grout. 04/29/2021 Medical Record Number: [id number] Patient Account Number: [id number] Date of Birth/Sex: Treating RN: 02-10-1966 (56 y.o. 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 M86.9 Osteomyelitis, unspecified E11.622 Type 2 diabetes mellitus with other skin ulcer Facility Procedures CPT4 Code: [id number] Description: 01749 - WOUND VAC-GREATER TH 50 SQ CM ICD-10 Diagnosis Description L98.424 Non-pressure chronic  ulcer of back with necrosis of bone Modifier: Quantity: 1 Physician Procedures : CPT4 Code Description Modifier [id number] - WC PHYS LEVEL 3 - EST PT ICD-10 Diagnosis Description L98.424 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

## 2021-04-30 NOTE — Progress Notes (Addendum)
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   Klebsiella pneumoniae infection 01/28/2021   Wound infection after surgery 01/25/2021   Muscle spasms of both lower extremities 01/25/2021   Abdominal pain 01/25/2021   Spleen enlarged 01/25/2021   Lumbar radiculopathy 01/11/2021   Spasticity 12/03/2020   Neuropathy 09/28/2020   Chronic pain syndrome 09/28/2020   Intervertebral lumbar disc disorder with myelopathy, lumbar region 09/28/2020   Spondylosis, cervical, with myelopathy 09/28/2020   Unilateral primary osteoarthritis, right knee 10/28/2019   PCP NOTES >>>>>>>> 07/09/2015   DJD (degenerative joint disease) 10/27/2014   Screening for heart disease 06/28/2012   OSA (obstructive sleep apnea) 06/14/2012   ED (erectile dysfunction) 04/26/2012   Annual physical exam 12/29/2011   Achilles tendinitis 09/10/2010   Allergic rhinitis 07/12/2010   Hyperlipidemia 04/26/2007   DM II (diabetes mellitus, type II), controlled ([place]) 04/22/2006   OBESITY NOS 04/22/2006   Essential hypertension 04/22/2006   Current Outpatient Medications on File Prior to Visit  Medication Sig Dispense Refill   ascorbic acid (VITAMIN C) 500 MG tablet Take 1 tablet (500 mg total) by mouth 2 (two) times daily. 100 tablet 3   blood glucose meter kit and supplies Use up to four times daily as directed. Dx: E11.9 1 each 0   Blood Glucose Monitoring Suppl ([place]) w/Device KIT Test blood sugar twice a day.  Dx code: E11.9 1 kit 0   citalopram (CELEXA) 20  MG tablet Take 1 tablet (20 mg total) by mouth daily. 30 tablet 5   cyclobenzaprine (FLEXERIL) 10 MG tablet Take 1 tablet (10 mg total) by mouth 3 (three) times daily as needed for muscle spasms. 90 tablet 0   diazepam (VALIUM) 5 MG tablet Take 1 tablet (5 mg total) by mouth at bedtime. 30 tablet 0   enoxaparin (LOVENOX) 40 MG/0.4ML injection Inject 0.4 mLs (40 mg total) into the skin daily. 14.8 mL 0   fluticasone (FLONASE) 50 MCG/ACT nasal spray Place into the nose.     glucose blood (ONETOUCH VERIO) test strip Check blood sugars before meals and at bedtime.  twice daily 200 strip 12   HYDROmorphone (DILAUDID) 2 MG tablet Take 1 tablet (2 mg total) by mouth every Monday, Wednesday, and Friday. Take 30 minutes prior to Wound VAC change 28 tablet 0   lidocaine (LIDODERM) 5 % Place 2 patches onto the skin daily. At 8 pm and remove at 8 am daily 60 patch 0   losartan (COZAAR) 100 MG tablet Take 1 tablet (100 mg total) by mouth daily. 30 tablet 1   Menthol-Methyl Salicylate (MUSCLE RUB) 10-15 % CREA Apply 1 application topically 4 (four) times daily -  with meals and at bedtime. 85 g 0   metFORMIN (GLUCOPHAGE) 1000 MG tablet TAKE 1 TABLET (1,000 MG TOTAL) BY MOUTH 2 (TWO) TIMES DAILY WITH A MEAL. 180 tablet 1   morphine (MS CONTIN) 15 MG 12 hr tablet Take 2 tablets (30 mg total) by mouth every  12 (twelve) hours. 60 tablet 0   nutrition supplement, JUVEN, (JUVEN) PACK Take 1 packet by mouth 2 (two) times daily between meals. 60 packet 0   ondansetron (ZOFRAN) 4 MG tablet Take 1 tablet (4 mg total) by mouth every 8 (eight) hours as needed for nausea or vomiting. Can cause constipation- monitor- 90 tablet 1   OneTouch Delica Lancets 46T MISC Test blood sugar before meals and at bedtime.  Dx code: E11.9 200 each 12   oxyCODONE (ROXICODONE) 15 MG immediate release tablet Take 1 tablet (15 mg total) by mouth every 8 (eight) hours as needed ((score 7 to 10)). 90 tablet 0   polyethylene glycol (MIRALAX /  GLYCOLAX) 17 g packet Take 17 g by mouth 2 (two) times daily. 14 each 0   protein supplement (RESOURCE BENEPROTEIN) POWD Take 6 g by mouth 3 (three) times daily with meals. 227 g 0   senna (SENOKOT) 8.6 MG TABS tablet Take 3 tablets (25.8 mg total) by mouth 2 (two) times daily. 270 tablet 0   simethicone (MYLICON) 80 MG chewable tablet Chew 1 tablet (80 mg total) by mouth 4 (four) times daily as needed for flatulence. 60 tablet 0   tadalafil (CIALIS) 20 MG tablet TAKE 1/2 TO 1 TABLET BY MOUTH DAILY AS NEEDED FOR ERECTILE DYSFUNCTION 30 tablet 3   tamsulosin (FLOMAX) 0.4 MG CAPS capsule Take 2 capsules (0.8 mg total) by mouth daily. 60 capsule 5   tiZANidine (ZANAFLEX) 4 MG tablet Take 1 tablet (4 mg total) by mouth 3 (three) times daily. 90 tablet 5   Zinc Sulfate 220 (50 Zn) MG TABS Take 1 tablet (220 mg total) by mouth daily. 100 tablet 1   gabapentin (NEURONTIN) 600 MG tablet Take 600 mg by mouth 3 (three) times daily.     rosuvastatin (CRESTOR) 20 MG tablet Take 1 tablet (20 mg total) by mouth daily. 90 tablet 3   No current facility-administered medications on file prior to visit.    Subjective: Here for follow up in the setting of deep lumbar wound infection associated with hardware.  He was last seen by Dr. Venetia Constable in early January and was told, no plans for surgical intervention as he was thought to be doing well.  Patient is accompanied by his wife.  Getting IV ceftriaxone through PICC line in the right arm.  No issues with PICC or IV antibiotics.  Back pain is 7 out of 10.  He is following wound care and still has a wound VAC in place.  Per wound care notes on 1/9, wound seems to be healing well with 67-100% red-pink granulation tissue in the wound bed and 1 to 33% of necrotic tissues.  Seen by plastics on 1/5 where wound was thought to be healing well and was anticipated that wound would close on wound VAC alone. on Dilaudid and MS Contin for pain control. discussed with patient and wife  regarding transitioning from IV to p.o. antibiotics now that patient has been approximately 3 months on IV antibiotics and reasonably doing well, he is also tired with a metallic taste from diet antibiotics-agreeable to switch to p.o. antibiotics.   Review of Systems: ROS denies fever, chills and sweats Denies nausea, vomiting or diarrhea All other systems reviewed and negative  Past Medical History:  Diagnosis Date   DDD (degenerative disc disease), lumbar    Diabetes mellitus    Hyperlipemia    Hypertension    Obesity    OSA (obstructive sleep apnea)  on CPAP   Past Surgical History:  Procedure Laterality Date   APPLICATION OF ROBOTIC ASSISTANCE FOR SPINAL PROCEDURE N/A 01/11/2021   Procedure: APPLICATION OF ROBOTIC ASSISTANCE FOR SPINAL PROCEDURE;  Surgeon: Judith Part, MD;  Location: [place] [facility];  Service: Neurosurgery;  Laterality: N/A;   APPLICATION OF WOUND VAC N/A 01/25/2021   Procedure: APPLICATION OF WOUND VAC;  Surgeon: Dawley, Theodoro Doing, DO;  Location: [facility];  Service: Neurosurgery;  Laterality: N/A;   BACK SURGERY     COLONOSCOPY  2019   LUMBAR WOUND DEBRIDEMENT N/A 01/25/2021   Procedure: Irrigation and Debridement of lumbar wound, and wound vacuum assisted closure;  Surgeon: Dawley, Theodoro Doing, DO;  Location: [facility];  Service: Neurosurgery;  Laterality: N/A;   TRANSFORAMINAL LUMBAR INTERBODY FUSION (TLIF) WITH PEDICLE SCREW FIXATION 4 LEVEL N/A 01/11/2021   Procedure: Lumbar one-two, Lumbar two-three, Lumbar three-four, Lumbar four-five, Lumbar five Sacral one Open decompression, Transforaminal lumbar interbody fusion, posterolateral instrumented fusion;  Surgeon: Judith Part, MD;  Location: [facility];  Service: Neurosurgery;  Laterality: N/A;    Social History   Tobacco Use   Smoking status: Never   Smokeless tobacco: Never  Vaping Use   Vaping Use: Never used  Substance Use Topics   Alcohol use: No   Drug use: No    Family History  Problem Relation Age of  Onset   Diabetes Mother    Hypertension Neg Hx    Coronary artery disease Neg Hx    Colon cancer Neg Hx    Prostate cancer Neg Hx     Allergies  Allergen Reactions   Cymbalta [Duloxetine Hcl] Other (See Comments)   Penicillins Other (See Comments)    REACTION: Blister and Sores in the mouth    Health Maintenance  Topic Date Due   Zoster Vaccines- Shingrix (1 of 2) Never done   Pneumococcal Vaccine 66-78 Years old (2 - PCV) 10/27/2015   OPHTHALMOLOGY EXAM  06/12/2017   COVID-19 Vaccine (5 - Booster for Pfizer series) 11/09/2020   INFLUENZA VACCINE  11/12/2020   FOOT EXAM  02/09/2021   HEMOGLOBIN A1C  07/08/2021   TETANUS/TDAP  12/28/2021   COLONOSCOPY (Pts 45-65yr Insurance coverage will need to be confirmed)  01/11/2025   Hepatitis C Screening  Completed   HIV Screening  Completed   HPV VACCINES  Aged Out    Objective: BP 135/81    Pulse (!) 103    Temp 98.1 F (36.7 C)    SpO2 97%     Physical Exam Constitutional:      Appearance: Normal appearance. In wheelchair  HENT:     Head: Normocephalic and atraumatic.      Mouth: Mucous membranes are moist.  Eyes:    Conjunctiva/sclera: Conjunctivae normal.     Pupils: Pupils are equal, round,  Cardiovascular:     Rate and Rhythm: Normal rate and regular rhythm.     Heart sounds:   Pulmonary:     Effort: Pulmonary effort is normal.     Breath sounds:   Abdominal:     General:     Palpations: Abdomen is soft.   Musculoskeletal:        General: Normal range of motion.   Skin:    General: Skin is warm and dry.     Comments: Wound VAC present in the lower back, no surrounding cellulitis  Neurological:     General: No focal deficit present.     Mental Status: awake,  alert and  oriented to person, place, and time.   Psychiatric:        Mood and Affect: Mood normal.   Lab Results Lab Results  Component Value Date   WBC 6.3 03/06/2021   HGB 7.5 (L) 03/06/2021   HCT 23.8 (L) 03/06/2021   MCV 87.2  03/06/2021   PLT 227 03/06/2021    Lab Results  Component Value Date   CREATININE 1.18 03/06/2021   BUN 17 03/06/2021   NA 135 03/06/2021   K 4.6 03/06/2021   CL 99 03/06/2021   CO2 30 03/06/2021    Lab Results  Component Value Date   ALT 13 02/01/2021   AST 14 (L) 02/01/2021   ALKPHOS 84 02/01/2021   BILITOT 0.7 02/01/2021    Lab Results  Component Value Date   CHOL 126 11/19/2020   HDL 34.00 (L) 11/19/2020   LDLCALC 62 11/19/2020   LDLDIRECT 118.0 09/14/2020   TRIG 148.0 11/19/2020   CHOLHDL 4 11/19/2020   No results found for: LABRPR, RPRTITER No results found for: HIV1RNAQUANT, HIV1RNAVL, CD4TABS  Problem List Items Addressed This Visit       Endocrine   DM II (diabetes mellitus, type II), controlled (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  tracking to T 11  # Medication Monitoring   Will switch patient from IV ceftriaxone to PO cefadroxil 1000 mg PO q 12 and planned to continue it indefinitely given the presence of hardware Labs from home health reviewed Will request last office notes from Dr Zada Finders  Follow-up with neurosurgery as instructed Follow-up with wound care for back wound Follow-up with [name] in 6 weeks  I have personally spent 40 minutes involved in face-to-face and non-face-to-face activities for this patient on the day of the visit including coordination of care with other specialist and counseling the patient/family.  Wilber Oliphant, [place] [facility] 04/30/2021, 11:11 AM

## 2021-04-30 NOTE — Progress Notes (Signed)
[name], [name] ([id number]) Visit Report for 04/29/2021 Arrival Information Details Patient Name: Date of Service: [name], [name] 04/29/2021 9:45 A M Medical Record Number: [id number] Patient Account Number: [id number] Date of Birth/Sex: Treating RN: 1965/07/15 (56 y.o. [name] Primary Care Breia Ocampo: [name] Other Clinician: Referring Kameren Baade: Treating Harvest Deist/Extender: [name] in Treatment: 4 Visit Information History Since Last Visit Added or deleted any medications: No Patient Arrived: Wheel Chair Any new allergies or adverse reactions: No Arrival Time: 09:47 Had a fall or experienced change in No Accompanied By: Brother activities of daily living that may affect Transfer Assistance: None risk of falls: Patient Identification Verified: Yes Signs or symptoms of abuse/neglect since last visito No Secondary Verification Process Completed: Yes Hospitalized since last visit: No Patient Requires Transmission-Based Precautions: No Implantable device outside of the clinic excluding No Patient Has Alerts: Yes cellular tissue based products placed in the center Patient Alerts: Patient on Blood Thinner since last visit: No BP Right Arm-PICC Has Dressing in Place as Prescribed: Yes Pain Present Now: Yes Electronic Signature(s) Signed: 04/30/2021 3:58:04 PM By: Lorrin Jackson Entered By: Lorrin Jackson on 04/29/2021 09:51:26 -------------------------------------------------------------------------------- Encounter Discharge Information Details Patient Name: Date of Service: Christian Brown E. 04/29/2021 9:45 A M Medical Record Number: [id number] Patient Account Number: [id number] Date of Birth/Sex: Treating RN: 1965/04/26 (56 y.o. [name] Primary Care Maanvi Lecompte: [name] Other Clinician: Referring Kimyata Milich: Treating Savien Mamula/Extender: [name] in Treatment: 4 Encounter Discharge Information Items Discharge  Condition: Stable Ambulatory Status: Wheelchair Discharge Destination: Home Transportation: Private Auto Accompanied By: Brother Schedule Follow-up Appointment: Yes Clinical Summary of Care: Provided on 04/29/2021 Form Type Recipient Paper Patient Patient Electronic Signature(s) Signed: 04/30/2021 3:58:04 PM By: Lorrin Jackson Entered By: Lorrin Jackson on 04/29/2021 11:02:07 -------------------------------------------------------------------------------- Lower Extremity Assessment Details Patient Name: Date of Service: [name], [name] 04/29/2021 9:45 A M Medical Record Number: [id number] Patient Account Number: [id number] Date of Birth/Sex: Treating RN: 1965/12/30 (56 y.o. [name] Primary Care Kentley Blyden: [name] Other Clinician: Referring Jessup Ogas: Treating Akshaya Toepfer/Extender: [name] Weeks in Treatment: 4 Electronic Signature(s) Signed: 04/30/2021 3:58:04 PM By: Lorrin Jackson Entered By: Lorrin Jackson on 04/29/2021 09:47:17 -------------------------------------------------------------------------------- Multi Wound Chart Details Patient Name: Date of Service: Christian Brown E. 04/29/2021 9:45 A M Medical Record Number: [id number] Patient Account Number: [id number] Date of Birth/Sex: Treating RN: [birth year] (56 y.o. [name] Primary Care Javel Hersh: [name] Other Clinician: Referring Matilynn Dacey: Treating Bodhi Stenglein/Extender: [name] Weeks in Treatment: 4 Vital Signs Height(in): 74 Pulse(bpm): 109 Weight(lbs): Blood Pressure(mmHg): 76/48 Body Mass Index(BMI): Temperature(F): 98 Respiratory Rate(breaths/min): 18 Photos: [1:No Photos Midline Back] [N/A:N/A N/A] Wound Location: [1:Surgical Injury] [N/A:N/A] Wounding Event: [1:Open Surgical Wound] [N/A:N/A] Primary Etiology: [1:Hypertension, Type II Diabetes,] [N/A:N/A] Comorbid History: [1:Osteomyelitis 01/11/2021] [N/A:N/A] Date Acquired: [1:4] [N/A:N/A] Weeks of  Treatment: [1:Open] [N/A:N/A] Wound Status: [1:13.5x4.7x2] [N/A:N/A] Measurements L x W x D (cm) [1:49.834] [N/A:N/A] A (cm) : rea [1:99.667] [N/A:N/A] Volume (cm) : [1:34.10%] [N/A:N/A] % Reduction in A rea: [1:42.70%] [N/A:N/A] % Reduction in Volume: [1:12] Position 1 (o'clock): [1:3.8] Maximum Distance 1 (cm): [1:Yes] [N/A:N/A] Tunneling: [1:Full Thickness Without Exposed] [N/A:N/A] Classification: [1:Support Structures Large] [N/A:N/A] Exudate Amount: [1:Serosanguineous] [N/A:N/A] Exudate Type: [1:red, brown] [N/A:N/A] Exudate Color: [1:Well defined, not attached] [N/A:N/A] Wound Margin: [1:Large (67-100%)] [N/A:N/A] Granulation Amount: [1:Red, Pink] [N/A:N/A] Granulation Quality: [1:Small (1-33%)] [N/A:N/A] Necrotic Amount: [1:Fat Layer (Subcutaneous Tissue): Yes N/A] Exposed Structures: [1:Fascia: No Tendon: No Muscle: No Joint:  No Bone: No Small (1-33%)] [N/A:N/A] Epithelialization: [1:Negative Pressure Wound Therapy] [N/A:N/A] Procedures Performed: [1:Maintenance (NPWT)] Treatment Notes Wound #1 (Back) Wound Laterality: Midline Cleanser Peri-Wound Care Topical Primary Dressing Secondary Dressing Secured With Compression Wrap Compression Stockings Add-Ons Notes Wound vac applied Electronic Signature(s) Signed: 04/29/2021 11:54:18 AM By: Kalman Shan DO Signed: 04/30/2021 3:58:04 PM By: Lorrin Jackson Entered By: Kalman Shan on 04/29/2021 11:34:43 -------------------------------------------------------------------------------- Multi-Disciplinary Care Plan Details Patient Name: Date of Service: Christian Brown E. 04/29/2021 9:45 A M Medical Record Number: [id number] Patient Account Number: [id number] Date of Birth/Sex: Treating RN: 17-Aug-1965 (56 y.o. [name] Primary Care Oshay Stranahan: [name] Other Clinician: Referring Mauria Asquith: Treating Shamone Winzer/Extender: [name] Weeks in Treatment: 4 Active Inactive Osteomyelitis Nursing  Diagnoses: Infection: osteomyelitis Goals: Patient's osteomyelitis will resolve Date Initiated: 03/28/2021 Target Resolution Date: 05/03/2021 Goal Status: Active Interventions: Assess for signs and symptoms of osteomyelitis resolution every visit Treatment Activities: MRI : 04/02/2021 Systemic antibiotics : 03/28/2021 Notes: Wound/Skin Impairment Nursing Diagnoses: Impaired tissue integrity Goals: Patient/caregiver will verbalize understanding of skin care regimen Date Initiated: 03/28/2021 Target Resolution Date: 05/03/2021 Goal Status: Active Ulcer/skin breakdown will have a volume reduction of 30% by week 4 Date Initiated: 03/28/2021 Target Resolution Date: 05/03/2021 Goal Status: Active Interventions: Assess patient/caregiver ability to obtain necessary supplies Assess patient/caregiver ability to perform ulcer/skin care regimen upon admission and as needed Assess ulceration(s) every visit Provide education on ulcer and skin care Treatment Activities: Topical wound management initiated : 03/28/2021 Notes: Electronic Signature(s) Signed: 04/30/2021 3:58:04 PM By: Lorrin Jackson Entered By: Lorrin Jackson on 04/29/2021 09:46:41 -------------------------------------------------------------------------------- Negative Pressure Wound Therapy Maintenance (NPWT) Details Patient Name: Date of Service: [name], [name] 04/29/2021 9:45 A M Medical Record Number: [id number] Patient Account Number: [id number] Date of Birth/Sex: Treating RN: 1966/03/13 (56 y.o. [name] Primary Care Llewyn Heap: [name] Other Clinician: Referring Milta Croson: Treating Artis Beggs/Extender: [name] Weeks in Treatment: 4 NPWT Maintenance Performed for: Wound #1 Midline Back Performed By: Lorrin Jackson, RN Type: VAC System Coverage Size (sq cm): 63.45 Pressure Type: Constant Pressure Setting: 125 mmHG Drain Type: None Sponge/Dressing Type: Combination : White, Black Date  Initiated: 03/28/2021 Dressing Removed: Yes Canister Changed: No Dressing Reapplied: Yes Quantity of Sponges/Gauze Inserted: 3 Days On NPWT : 33 Post Procedure Diagnosis Same as Pre-procedure Electronic Signature(s) Signed: 04/30/2021 3:58:04 PM By: Lorrin Jackson Entered By: Lorrin Jackson on 04/29/2021 10:30:08 -------------------------------------------------------------------------------- Pain Assessment Details Patient Name: Date of Service: Christian Brown E. 04/29/2021 9:45 A M Medical Record Number: [id number] Patient Account Number: [id number] Date of Birth/Sex: Treating RN: [birth year] (56 y.o. [name] Primary Care Giovanna Kemmerer: [name] Other Clinician: Referring Bryn Saline: Treating Muriel Hannold/Extender: [name] Weeks in Treatment: 4 Active Problems Location of Pain Severity and Description of Pain Patient Has Paino Yes Site Locations Pain Location: Pain in Ulcers With Dressing Change: Yes Rate the pain. Current Pain Level: 8 Character of Pain Describe the Pain: Tender, Throbbing Pain Management and Medication Current Pain Management: Medication: Yes Cold Application: No Rest: Yes Massage: No Activity: No T.MurphyN.S.: No Heat Application: No Leg drop or elevation: No Is the Current Pain Management Adequate: Adequate How does your wound impact your activities of daily livingo Sleep: No Bathing: No Appetite: No Relationship With Others: No Bladder Continence: No Emotions: No Bowel Continence: No Work: No Toileting: No Drive: No Dressing: No Hobbies: No Electronic Signature(s) Signed: 04/30/2021 3:58:04 PM By: Lorrin Jackson Entered By: Lorrin Jackson on 04/29/2021 09:52:16 -------------------------------------------------------------------------------- Patient/Caregiver Education  Details Patient Name: Date of Service: [name], [name] 1/16/2023andnbsp9:45 [place] Record Number: [id number] Patient Account Number:  [id number] Date of Birth/Gender: Treating RN: [birth year] (56 y.o. [name] Primary Care Physician: [name] Other Clinician: Referring Physician: Treating Physician/Extender: [name] in Treatment: 4 Education Assessment Education Provided To: Patient Education Topics Provided Wound/Skin Impairment: Methods: Demonstration, Explain/Verbal, Printed Responses: State content correctly Electronic Signature(s) Signed: 04/30/2021 3:58:04 PM By: Lorrin Jackson Entered By: Lorrin Jackson on 04/29/2021 09:47:01 -------------------------------------------------------------------------------- Wound Assessment Details Patient Name: Date of Service: Christian Brown E. 04/29/2021 9:45 A M Medical Record Number: [id number] Patient Account Number: [id number] Date of Birth/Sex: Treating RN: 11-Mar-1966 (56 y.o. [name] Primary Care Seith Aikey: [name] Other Clinician: Referring Kekoa Fyock: Treating Lexa Coronado/Extender: [name] Weeks in Treatment: 4 Wound Status Wound Number: 1 Primary Etiology: Open Surgical Wound Wound Location: Midline Back Wound Status: Open Wounding Event: Surgical Injury Comorbid History: Hypertension, Type II Diabetes, Osteomyelitis Date Acquired: 01/11/2021 Weeks Of Treatment: 4 Clustered Wound: No Wound Measurements Length: (cm) 13.5 Width: (cm) 4.7 Depth: (cm) 2 Area: (cm) 49.834 Volume: (cm) 99.667 % Reduction in Area: 34.1% % Reduction in Volume: 42.7% Epithelialization: Small (1-33%) Tunneling: Yes Position (o'clock): 12 Maximum Distance: (cm) 3.8 Undermining: No Wound Description Classification: Full Thickness Without Exposed Support Structures Wound Margin: Well defined, not attached Exudate Amount: Large Exudate Type: Serosanguineous Exudate Color: red, brown Foul Odor After Cleansing: No Slough/Fibrino Yes Wound Bed Granulation Amount: Large (67-100%) Exposed Structure Granulation  Quality: Red, Pink Fascia Exposed: No Necrotic Amount: Small (1-33%) Fat Layer (Subcutaneous Tissue) Exposed: Yes Necrotic Quality: Adherent Slough Tendon Exposed: No Muscle Exposed: No Joint Exposed: No Bone Exposed: No Treatment Notes Wound #1 (Back) Wound Laterality: Midline Cleanser Peri-Wound Care Topical Primary Dressing Secondary Dressing Secured With Compression Wrap Compression Stockings Add-Ons Notes Wound vac applied Electronic Signature(s) Signed: 04/30/2021 3:58:04 PM By: Lorrin Jackson Entered By: Lorrin Jackson on 04/29/2021 09:55:35 -------------------------------------------------------------------------------- Vitals Details Patient Name: Date of Service: Christian Brown E. 04/29/2021 9:45 A M Medical Record Number: [id number] Patient Account Number: [id number] Date of Birth/Sex: Treating RN: [birth year] (56 y.o. [name] Primary Care Maxie Debose: [name] Other Clinician: Referring Bryan Goin: Treating Eloyse Causey/Extender: [name] Weeks in Treatment: 4 Vital Signs Time Taken: 09:51 Temperature (F): 98 Height (in): 74 Pulse (bpm): 109 Respiratory Rate (breaths/min): 18 Blood Pressure (mmHg): 76/48 Reference Range: 80 - 120 mg / dl Electronic Signature(s) Signed: 04/30/2021 3:58:04 PM By: Lorrin Jackson Entered By: Lorrin Jackson on 04/29/2021 09:51:52

## 2021-04-30 NOTE — Telephone Encounter (Signed)
Thank you. Tiffany

## 2021-05-02 ENCOUNTER — Encounter (HOSPITAL_BASED_OUTPATIENT_CLINIC_OR_DEPARTMENT_OTHER): Payer: 59 | Admitting: Internal Medicine

## 2021-05-02 ENCOUNTER — Other Ambulatory Visit: Payer: Self-pay

## 2021-05-02 DIAGNOSIS — L98424 Non-pressure chronic ulcer of back with necrosis of bone: Secondary | ICD-10-CM | POA: Diagnosis not present

## 2021-05-02 NOTE — Progress Notes (Signed)
[name], [name] ([id number]) Visit Report for 05/02/2021 SuperBill Details Patient Name: Date of Service: [name], [name] 05/02/2021 Medical Record Number: [id number] Patient Account Number: [id number] Date of Birth/Sex: Treating RN: Jan 16, 1966 (56 y.o. 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

## 2021-05-02 NOTE — Progress Notes (Signed)
[name], [name] ([id number]) Visit Report for 05/02/2021 Arrival Information Details Patient Name: Date of Service: [name], [name] 05/02/2021 9:30 A M Medical Record Number: [id number] Patient Account Number: [id number] Date of Birth/Sex: Treating RN: 04-10-1966 (56 y.o. [name] Primary Care Nishtha Raider: [name] Other Clinician: Referring Opal Mckellips: Treating Delores Thelen/Extender: [name] Weeks in Treatment: 5 Visit Information History Since Last Visit Added or deleted any medications: No Patient Arrived: Wheel Chair Any new allergies or adverse reactions: No Arrival Time: 09:50 Had a fall or experienced change in No Accompanied By: friend activities of daily living that may affect Transfer Assistance: None risk of falls: Patient Identification Verified: Yes Signs or symptoms of abuse/neglect since last visito No Secondary Verification Process Completed: Yes Hospitalized since last visit: No Patient Requires Transmission-Based Precautions: No Implantable device outside of the clinic excluding No Patient Has Alerts: Yes cellular tissue based products placed in the center Patient Alerts: Patient on Blood Thinner since last visit: No BP Right Arm-PICC Has Dressing in Place as Prescribed: Yes Pain Present Now: No Electronic Signature(s) Signed: 05/02/2021 5:38:49 PM By: Lorrin Jackson Entered By: Lorrin Jackson on 05/02/2021 09:51:18 -------------------------------------------------------------------------------- Encounter Discharge Information Details Patient Name: Date of Service: Christian Brown E. 05/02/2021 9:30 A M Medical Record Number: [id number] Patient Account Number: [id number] Date of Birth/Sex: Treating RN: 11/01/1965 (56 y.o. [name] Primary Care Raychell Holcomb: [name] Other Clinician: Referring Nyrah Demos: Treating Gabrielle Wakeland/Extender: [name] in Treatment: 5 Encounter Discharge Information Items Discharge Condition:  Stable Ambulatory Status: Wheelchair Discharge Destination: Home Transportation: Private Auto Accompanied By: friend Schedule Follow-up Appointment: Yes Clinical Summary of Care: Provided on 05/02/2021 Form Type Recipient Paper Patient Patient Electronic Signature(s) Signed: 05/02/2021 5:38:49 PM By: Lorrin Jackson Entered By: Lorrin Jackson on 05/02/2021 10:30:31 -------------------------------------------------------------------------------- Negative Pressure Wound Therapy Maintenance (NPWT) Details Patient Name: Date of Service: [name], [name] 05/02/2021 9:30 A M Medical Record Number: [id number] Patient Account Number: [id number] Date of Birth/Sex: Treating RN: Apr 19, 1965 (56 y.o. [name] Primary Care Augusten Lipkin: [name] Other Clinician: Referring Jaan Fischel: Treating Detrich Rakestraw/Extender: [name] Weeks in Treatment: 5 NPWT Maintenance Performed for: Wound #1 Midline Back Performed By: Lorrin Jackson, RN Type: VAC System Coverage Size (sq cm): 63.45 Pressure Type: Constant Pressure Setting: 125 mmHG Drain Type: None Sponge/Dressing Type: Combination : white, black Date Initiated: 03/28/2021 Dressing Removed: Yes Canister Changed: Yes Dressing Reapplied: Yes Days On NPWT : 36 Electronic Signature(s) Signed: 05/02/2021 5:38:49 PM By: Lorrin Jackson Entered By: Lorrin Jackson on 05/02/2021 09:53:20 -------------------------------------------------------------------------------- Patient/Caregiver Education Details Patient Name: Date of Service: [name] 1/19/2023andnbsp9:30 [place] Record Number: [id number] Patient Account Number: [id number] Date of Birth/Gender: Treating RN: [birth year] (56 y.o. [name] Primary Care Physician: [name] Other Clinician: Referring Physician: Treating Physician/Extender: [name] in Treatment: 5 Education Assessment Education Provided To: Patient Education Topics  Provided Wound/Skin Impairment: Methods: Demonstration, Explain/Verbal Responses: State content correctly Electronic Signature(s) Signed: 05/02/2021 5:38:49 PM By: Lorrin Jackson Entered By: Lorrin Jackson on 05/02/2021 10:30:07 -------------------------------------------------------------------------------- Wound Assessment Details Patient Name: Date of Service: Christian Brown E. 05/02/2021 9:30 A M Medical Record Number: [id number] Patient Account Number: [id number] Date of Birth/Sex: Treating RN: [birth year] (56 y.o. [name] Primary Care Laney Louderback: [name] Other Clinician: Referring Verle Wheeling: Treating Macey Wurtz/Extender: [name] Weeks in Treatment: 5 Wound Status Wound Number: 1 Primary Etiology: Open Surgical Wound Wound Location: Midline Back Wound Status: Open Wounding  Event: Surgical Injury Comorbid History: Hypertension, Type II Diabetes, Osteomyelitis Date Acquired: 01/11/2021 Weeks Of Treatment: 5 Clustered Wound: No Wound Measurements Length: (cm) 13.5 Width: (cm) 4.7 Depth: (cm) 2 Area: (cm) 49.834 Volume: (cm) 99.667 % Reduction in Area: 34.1% % Reduction in Volume: 42.7% Epithelialization: Small (1-33%) Tunneling: Yes Position (o'clock): 12 Maximum Distance: (cm) 3.8 Undermining: No Wound Description Classification: Full Thickness Without Exposed Support Structures Wound Margin: Well defined, not attached Exudate Amount: Medium Exudate Type: Serosanguineous Exudate Color: red, brown Foul Odor After Cleansing: No Slough/Fibrino Yes Wound Bed Granulation Amount: Large (67-100%) Exposed Structure Granulation Quality: Red, Pink Fascia Exposed: No Necrotic Amount: Small (1-33%) Fat Layer (Subcutaneous Tissue) Exposed: Yes Necrotic Quality: Adherent Slough Tendon Exposed: No Muscle Exposed: No Joint Exposed: No Bone Exposed: No Treatment Notes Wound #1 (Back) Wound Laterality: Midline Cleanser Peri-Wound  Care Topical Primary Dressing Secondary Dressing Secured With Compression Wrap Compression Stockings Add-Ons Notes Wound vac applied Electronic Signature(s) Signed: 05/02/2021 5:38:49 PM By: Lorrin Jackson Entered By: Lorrin Jackson on 05/02/2021 09:52:39 -------------------------------------------------------------------------------- Vitals Details Patient Name: Date of Service: Christian Brown E. 05/02/2021 9:30 A M Medical Record Number: [id number] Patient Account Number: [id number] Date of Birth/Sex: Treating RN: Jul 22, 1965 (56 y.o. [name] Primary Care Mathias Bogacki: [name] Other Clinician: Referring Wanya Bangura: Treating Greenley Martone/Extender: [name] Weeks in Treatment: 5 Vital Signs Time Taken: 09:54 Temperature (F): 98.3 Height (in): 74 Pulse (bpm): 105 Respiratory Rate (breaths/min): 18 Blood Pressure (mmHg): 110/66 Reference Range: 80 - 120 mg / dl Electronic Signature(s) Signed: 05/02/2021 5:38:49 PM By: Lorrin Jackson Entered By: Lorrin Jackson on 05/02/2021 09:56:47

## 2021-05-03 ENCOUNTER — Ambulatory Visit (INDEPENDENT_AMBULATORY_CARE_PROVIDER_SITE_OTHER): Payer: No Typology Code available for payment source | Admitting: Internal Medicine

## 2021-05-03 ENCOUNTER — Encounter: Payer: Self-pay | Admitting: Internal Medicine

## 2021-05-03 VITALS — BP 122/78 | HR 111 | Temp 98.2°F | Resp 18 | Ht 74.0 in | Wt 248.5 lb

## 2021-05-03 DIAGNOSIS — E118 Type 2 diabetes mellitus with unspecified complications: Secondary | ICD-10-CM | POA: Diagnosis not present

## 2021-05-03 DIAGNOSIS — Z23 Encounter for immunization: Secondary | ICD-10-CM

## 2021-05-03 DIAGNOSIS — T847XXD Infection and inflammatory reaction due to other internal orthopedic prosthetic devices, implants and grafts, subsequent encounter: Secondary | ICD-10-CM

## 2021-05-03 DIAGNOSIS — D62 Acute posthemorrhagic anemia: Secondary | ICD-10-CM | POA: Diagnosis not present

## 2021-05-03 DIAGNOSIS — I10 Essential (primary) hypertension: Secondary | ICD-10-CM

## 2021-05-03 LAB — CBC WITH DIFFERENTIAL/PLATELET
Basophils Absolute: 0 10*3/uL (ref 0.0–0.1)
Basophils Relative: 0.5 % (ref 0.0–3.0)
Eosinophils Absolute: 0.5 10*3/uL (ref 0.0–0.7)
Eosinophils Relative: 8.6 % — ABNORMAL HIGH (ref 0.0–5.0)
HCT: 32.2 % — ABNORMAL LOW (ref 39.0–52.0)
Hemoglobin: 10.6 g/dL — ABNORMAL LOW (ref 13.0–17.0)
Lymphocytes Relative: 12.5 % (ref 12.0–46.0)
Lymphs Abs: 0.8 10*3/uL (ref 0.7–4.0)
MCHC: 32.9 g/dL (ref 30.0–36.0)
MCV: 83.8 fl (ref 78.0–100.0)
Monocytes Absolute: 0.4 10*3/uL (ref 0.1–1.0)
Monocytes Relative: 6.6 % (ref 3.0–12.0)
Neutro Abs: 4.5 10*3/uL (ref 1.4–7.7)
Neutrophils Relative %: 71.8 % (ref 43.0–77.0)
Platelets: 254 10*3/uL (ref 150.0–400.0)
RBC: 3.85 Mil/uL — ABNORMAL LOW (ref 4.22–5.81)
RDW: 15.4 % (ref 11.5–15.5)
WBC: 6.3 10*3/uL (ref 4.0–10.5)

## 2021-05-03 LAB — COMPREHENSIVE METABOLIC PANEL
ALT: 8 U/L (ref 0–53)
AST: 13 U/L (ref 0–37)
Albumin: 3.6 g/dL (ref 3.5–5.2)
Alkaline Phosphatase: 86 U/L (ref 39–117)
BUN: 9 mg/dL (ref 6–23)
CO2: 30 mEq/L (ref 19–32)
Calcium: 9.3 mg/dL (ref 8.4–10.5)
Chloride: 101 mEq/L (ref 96–112)
Creatinine, Ser: 1.04 mg/dL (ref 0.40–1.50)
GFR: 79.53 mL/min (ref >60.00)
Glucose, Bld: 96 mg/dL (ref 70–99)
Potassium: 3.4 mEq/L — ABNORMAL LOW (ref 3.5–5.1)
Sodium: 140 mEq/L (ref 135–145)
Total Bilirubin: 0.4 mg/dL (ref 0.2–1.2)
Total Protein: 6.9 g/dL (ref 6.0–8.3)

## 2021-05-03 LAB — IRON: Iron: 42 ug/dL (ref 42–165)

## 2021-05-03 LAB — FERRITIN: Ferritin: 274.8 ng/mL (ref 22.0–322.0)

## 2021-05-03 LAB — HEMOGLOBIN A1C: Hgb A1c MFr Bld: 5.1 % (ref 4.6–6.5)

## 2021-05-03 MED ORDER — NALOXONE HCL 4 MG/0.1ML NA LIQD: NASAL | 1 refills | Status: DC

## 2021-05-03 NOTE — Progress Notes (Signed)
Subjective:    Patient ID: [name], male    DOB: 07-13-1965, 56 y.o.   MRN: [id number]  DOS:  05/03/2021 Type of visit - description: Acute  Here because his blood pressure has been low.    Last visit with me was 09/2020, since then multiple things happening, chart reviewed and summarized below:  - 12-2020: Had elective surgery (level 5 lumbar fusion) - Subsequently developed a wound infection, readmitted in October, wound and blood culture showed Klebsiella. Was taken to the OR, wound washout.  Wound VAC placed. 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 -He is follow-up by ID, last visit 04/30/2021.  BP at the time was 135/81, heart rate 103.  Currently on cefadroxil orally. - Also follow-up by the wound care center, last visit 04/29/2021  Low BP noted about 2 weeks ago, as low as in the 70s, he got dizzy when standing. Self discontinued losartan 2 weeks ago. Since then he feels somewhat better. Also concerned about diabetes. Anemia was noted.  Not clear if he is taking iron supplements.   Review of Systems At the time of blood pressure noted to be low 2 weeks ago, he did not have any fever or chills. No nausea or vomiting. No blood in the stools.   Past Medical History:  Diagnosis Date   DDD (degenerative disc disease), lumbar    Diabetes mellitus    Hyperlipemia    Hypertension    Obesity    OSA (obstructive sleep apnea)    on CPAP    Past Surgical History:  Procedure Laterality Date   APPLICATION OF ROBOTIC ASSISTANCE FOR SPINAL PROCEDURE N/A 01/11/2021   Procedure: APPLICATION OF ROBOTIC ASSISTANCE FOR SPINAL PROCEDURE;  Surgeon: Judith Part, MD;  Location: [facility];  Service: Neurosurgery;  Laterality: N/A;   APPLICATION OF WOUND VAC N/A 01/25/2021   Procedure: APPLICATION OF WOUND VAC;  Surgeon: Dawley, Theodoro Doing, DO;  Location: [facility];  Service: Neurosurgery;  Laterality: N/A;   BACK SURGERY     COLONOSCOPY  2019    LUMBAR WOUND DEBRIDEMENT N/A 01/25/2021   Procedure: Irrigation and Debridement of lumbar wound, and wound vacuum assisted closure;  Surgeon: Dawley, Theodoro Doing, DO;  Location: [place] [facility];  Service: Neurosurgery;  Laterality: N/A;   TRANSFORAMINAL LUMBAR INTERBODY FUSION (TLIF) WITH PEDICLE SCREW FIXATION 4 LEVEL N/A 01/11/2021   Procedure: Lumbar one-two, Lumbar two-three, Lumbar three-four, Lumbar four-five, Lumbar five Sacral one Open decompression, Transforaminal lumbar interbody fusion, posterolateral instrumented fusion;  Surgeon: Judith Part, MD;  Location: [facility] [facility];  Service: Neurosurgery;  Laterality: N/A;    Current Outpatient Medications  Medication Instructions   ascorbic acid (VITAMIN C) 500 mg, Oral, 2 times daily   blood glucose meter kit and supplies Use up to four times daily as directed. Dx: E11.9   Blood Glucose Monitoring Suppl ([place]) w/Device KIT Test blood sugar twice a day.  Dx code: E11.9   cefadroxil (DURICEF) 1 g, Oral, 2 times daily   cyclobenzaprine (FLEXERIL) 10 mg, Oral, 3 times daily PRN   diazepam (VALIUM) 5 mg, Oral, Daily at bedtime   fluticasone (FLONASE) 50 MCG/ACT nasal spray Nasal   gabapentin (NEURONTIN) 600 mg, Oral, 3 times daily   glucose blood (ONETOUCH VERIO) test strip Check blood sugars before meals and at bedtime.  twice daily   HYDROmorphone (DILAUDID) 2 mg, Oral, Every M-W-F, Take 30 minutes prior to Wound VAC change   metFORMIN (GLUCOPHAGE)  1,000 mg, Oral, 2 times daily with meals   morphine (MS CONTIN) 30 mg, Oral, Every 12 hours   naloxone (NARCAN) nasal spray 4 mg/0.1 mL Use as directed   nutrition supplement, JUVEN, (JUVEN) PACK 1 packet, Oral, 2 times daily between meals   ondansetron (ZOFRAN) 4 mg, Oral, Every 8 hours PRN, Can cause constipation- monitor-   OneTouch Delica Lancets 27O MISC Test blood sugar before meals and at bedtime.  Dx code: E11.9   oxyCODONE (ROXICODONE) 15 mg, Oral, Every 8 hours PRN   polyethylene  glycol (MIRALAX / GLYCOLAX) 17 g, Oral, 2 times daily   protein supplement (RESOURCE BENEPROTEIN) POWD 6 g, Oral, 3 times daily with meals   rosuvastatin (CRESTOR) 20 mg, Oral, Daily   senna (SENOKOT) 25.8 mg, Oral, 2 times daily   simethicone (MYLICON) 80 mg, Oral, 4 times daily PRN   tadalafil (CIALIS) 20 MG tablet TAKE 1/2 TO 1 TABLET BY MOUTH DAILY AS NEEDED FOR ERECTILE DYSFUNCTION   tamsulosin (FLOMAX) 0.8 mg, Oral, Daily   tiZANidine (ZANAFLEX) 4 mg, Oral, 3 times daily   Zinc Sulfate 220 mg, Oral, Daily       Objective:   Physical Exam BP 122/78 (BP Location: Left Arm, Patient Position: Sitting, Cuff Size: Normal)    Pulse (!) 111    Temp 98.2 F (36.8 C) (Oral)    Resp 18    Ht '6\' 2"'  (1.88 m)    Wt 248 lb 8 oz (112.7 kg)    SpO2 98%    BMI 31.91 kg/m  General:   Well developed, NAD, BMI noted. HEENT:  Normocephalic . Face symmetric, atraumatic Lungs:  CTA B Normal respiratory effort, no intercostal retractions, no accessory muscle use. Heart: RRR,  no murmur.  Lower extremities: no pretibial edema bilaterally  Skin: Wound VAC in place, back. Neurologic:  alert & oriented X3.  Speech normal, gait not tested.  Sits in a wheelchair. Psych--  Cognition and judgment appear intact.  Cooperative with normal attention span and concentration.  Behavior appropriate. No anxious or depressed appearing.      Assessment     Assessment DM NO neuropathy per [facility] 07-2020.  SX related to back DJD HTN Hyperlipidemia MSK: -DJD Knees , back (severe per MRI, + radiation B legs) -Chronic neck pain Morbid obesity OSA( severe per repeated study April 2022), on CPAP Diastases recti   PLAN: Follow-up, here with his brother. Status post back surgery complicated by infection. Has a wound VAC in place, now follow-up by ID, plastic surgery, wound care center. HTN: He was on losartan up to 2 weeks ago when he noticed his BP was low.  Has not been taking it for the last 2 weeks.Chart  reviewed, BP is now acceptable, mild chronic tachycardia noted.  We will check CMP, no more losartan for now, monitor BPs, if it does start to increase consider restart meds, carvedilol? DM: Currently on metformin, Reports ambulatory BPs are very good in the 90-100.  We will check A1c. Anemia: Noted on chart review, apparently not on iron supplements, check a CBC, iron, ferritin.  Denies GI symptoms. Pain management: On multiple medications for pain control, rec Narcan, rx sent. Citalopram: Not taking SSRIs.  States he does not need SSRIs. Social: Currently living at home with his family, working status >>> "forced to retire". Preventive care: Flu shot today RTC 3 months  This visit occurred during the SARS-CoV-2 public health emergency.  Safety protocols were in place, including screening questions  prior to the visit, additional usage of staff PPE, and extensive cleaning of exam room while observing appropriate contact time as indicated for disinfecting solutions.

## 2021-05-03 NOTE — Assessment & Plan Note (Signed)
Follow-up, here with his brother. Status post back surgery complicated by infection. Has a wound VAC in place, now follow-up by ID, plastic surgery, wound care center. HTN: He was on losartan up to 2 weeks ago when he noticed his BP was low.  Has not been taking it for the last 2 weeks.Chart reviewed, BP is now acceptable, mild chronic tachycardia noted.  We will check CMP, no more losartan for now, monitor BPs, if it does start to increase consider restart meds, carvedilol? DM: Currently on metformin, Reports ambulatory BPs are very good in the 90-100.  We will check A1c. Anemia: Noted on chart review, apparently not on iron supplements, check a CBC, iron, ferritin.  Denies GI symptoms. Pain management: On multiple medications for pain control, rec Narcan, rx sent. Citalopram: Not taking SSRIs.  States he does not need SSRIs. Social: Currently living at home with his family, working status >>> "forced to retire". Preventive care: Flu shot today RTC 3 months

## 2021-05-03 NOTE — Patient Instructions (Addendum)
Per our records you are due for your diabetic eye exam. Please contact your eye doctor to schedule an appointment. Please have them send copies of your office visit notes to [name]. Our fax number is [phone]. If you need a referral to an eye doctor please let us know.   Continue checking your blood pressure regularly.  Daily reviewed today. BP GOAL is between 110/65 and  135/85. If it is consistently higher or lower, let me know   GO TO THE LAB : Get the blood work     [place], [place] back for checkup in 3 months

## 2021-05-04 ENCOUNTER — Encounter: Payer: Self-pay | Admitting: Physical Medicine and Rehabilitation

## 2021-05-06 ENCOUNTER — Other Ambulatory Visit: Payer: Self-pay | Admitting: Physical Medicine and Rehabilitation

## 2021-05-06 ENCOUNTER — Encounter (HOSPITAL_BASED_OUTPATIENT_CLINIC_OR_DEPARTMENT_OTHER): Payer: 59 | Admitting: Internal Medicine

## 2021-05-06 ENCOUNTER — Other Ambulatory Visit: Payer: Self-pay

## 2021-05-06 DIAGNOSIS — M869 Osteomyelitis, unspecified: Secondary | ICD-10-CM

## 2021-05-06 DIAGNOSIS — E11622 Type 2 diabetes mellitus with other skin ulcer: Secondary | ICD-10-CM

## 2021-05-06 DIAGNOSIS — L98424 Non-pressure chronic ulcer of back with necrosis of bone: Secondary | ICD-10-CM

## 2021-05-06 MED ORDER — CITALOPRAM HYDROBROMIDE 20 MG PO TABS: 20.0000 mg | ORAL_TABLET | Freq: Every day | ORAL | 1 refills | Status: DC

## 2021-05-06 NOTE — Addendum Note (Signed)
Addended byDamita Dunnings D on: 05/06/2021 08:21 AM   Modules accepted: Orders

## 2021-05-06 NOTE — Progress Notes (Signed)
DEMARIUS, ARCHILA ([id number]) Visit Report for 05/06/2021 Arrival Information Details Patient Name: Date of Service: HORST, OSTERMILLER 05/06/2021 9:45 A M Medical Record Number: [id number] Patient Account Number: [id number] Date of Birth/Sex: Treating RN: 07/01/1965 (56 y.o. Ernestene Mention Primary Care Naidelin Gugliotta: [name] Other Clinician: Referring Zuhair Lariccia: Treating Kennisha Qin/Extender: [name] in Treatment: 5 Visit Information History Since Last Visit Added or deleted any medications: Yes Patient Arrived: Wheel Chair Any new allergies or adverse reactions: No Arrival Time: 09:28 Had a fall or experienced change in No Accompanied By: brother activities of daily living that may affect Transfer Assistance: None risk of falls: Patient Identification Verified: Yes Signs or symptoms of abuse/neglect since last visito No Secondary Verification Process Completed: Yes Hospitalized since last visit: No Patient Requires Transmission-Based Precautions: No Implantable device outside of the clinic excluding No Patient Has Alerts: Yes cellular tissue based products placed in the center Patient Alerts: Patient on Blood Thinner since last visit: No BP Right Arm-PICC Has Dressing in Place as Prescribed: Yes Pain Present Now: Yes Electronic Signature(s) Signed: 05/06/2021 4:39:43 PM By: Baruch Gouty RN, BSN Entered By: Baruch Gouty on 05/06/2021 09:29:51 -------------------------------------------------------------------------------- Encounter Discharge Information Details Patient Name: Date of Service: [name] 05/06/2021 9:45 A M Medical Record Number: [id number] Patient Account Number: [id number] Date of Birth/Sex: Treating RN: [birth year] (56 y.o. Ernestene Mention Primary Care Mckensey Berghuis: [name] Other Clinician: Referring Norris Bodley: Treating Samule Life/Extender: [name] in Treatment: 5 Encounter Discharge Information  Items Discharge Condition: Stable Ambulatory Status: Wheelchair Discharge Destination: Home Transportation: Private Auto Accompanied By: brother Schedule Follow-up Appointment: Yes Clinical Summary of Care: Patient Declined Electronic Signature(s) Signed: 05/06/2021 4:39:43 PM By: Baruch Gouty RN, BSN Entered By: Baruch Gouty on 05/06/2021 10:20:19 -------------------------------------------------------------------------------- Lower Extremity Assessment Details Patient Name: Date of Service: [name] 05/06/2021 9:45 A M Medical Record Number: [id number] Patient Account Number: [id number] Date of Birth/Sex: Treating RN: [birth year] (56 y.o. Ernestene Mention Primary Care Brinley Treanor: [name] Other Clinician: Referring Brianca Fortenberry: Treating Maryruth Apple/Extender: [name] Weeks in Treatment: 5 Electronic Signature(s) Signed: 05/06/2021 4:39:43 PM By: Baruch Gouty RN, BSN Entered By: Baruch Gouty on 05/06/2021 09:36:57 -------------------------------------------------------------------------------- Multi Wound Chart Details Patient Name: Date of Service: [name] 05/06/2021 9:45 A M Medical Record Number: [id number] Patient Account Number: [id number] Date of Birth/Sex: Treating RN: 09/19/1965 (56 y.o. [name] Primary Care Solina Heron: [name] Other Clinician: Referring Antoinett Dorman: Treating Eliab Closson/Extender: [name] Weeks in Treatment: 5 Vital Signs Height(in): 74 Capillary Blood Glucose(mg/dl): 101 Weight(lbs): Pulse(bpm): 101 Body Mass Index(BMI): Blood Pressure(mmHg): 111/76 Temperature(F): 98.4 Respiratory Rate(breaths/min): 18 Photos: [N/A:N/A] Midline Back N/A N/A Wound Location: Surgical Injury N/A N/A Wounding Event: Open Surgical Wound N/A N/A Primary Etiology: Hypertension, Type II Diabetes, N/A N/A Comorbid History: Osteomyelitis 01/11/2021 N/A N/A Date Acquired: 5 N/A N/A Weeks of  Treatment: Open N/A N/A Wound Status: No N/A N/A Wound Recurrence: 13.3x4.2x2 N/A N/A Measurements L x W x D (cm) 43.872 N/A N/A A (cm) : rea 87.745 N/A N/A Volume (cm) : 42.00% N/A N/A % Reduction in A rea: 49.50% N/A N/A % Reduction in Volume: 12 Position 1 (o'clock): 3.6 Maximum Distance 1 (cm): Yes N/A N/A Tunneling: Full Thickness Without Exposed N/A N/A Classification: Support Structures Medium N/A N/A Exudate Amount: Serosanguineous N/A N/A Exudate Type: red, brown N/A N/A Exudate Color: Well defined, not attached N/A N/A Wound Margin: Large (67-100%) N/A N/A Granulation Amount:  Red, Pink N/A N/A Granulation Quality: None Present (0%) N/A N/A Necrotic Amount: Fat Layer (Subcutaneous Tissue): Yes N/A N/A Exposed Structures: Fascia: No Tendon: No Muscle: No Joint: No Bone: No Small (1-33%) N/A N/A Epithelialization: Negative Pressure Wound Therapy N/A N/A Procedures Performed: Maintenance (NPWT) Treatment Notes Electronic Signature(s) Signed: 05/06/2021 10:17:52 AM By: Kalman Shan DO Signed: 05/06/2021 3:38:08 PM By: Lorrin Jackson Entered By: Kalman Shan on 05/06/2021 10:13:09 -------------------------------------------------------------------------------- [place] Details Patient Name: Date of Service: [name] 05/06/2021 9:45 A M Medical Record Number: [id number] Patient Account Number: [id number] Date of Birth/Sex: Treating RN: 03-Jan-1966 (56 y.o. Ernestene Mention Primary Care Gillian Meeuwsen: [name] Other Clinician: Referring Kylina Vultaggio: Treating Isidora Laham/Extender: [name] in Treatment: 5 [place] reviewed with physician Active Inactive Osteomyelitis Nursing Diagnoses: Infection: osteomyelitis Goals: Patient's osteomyelitis will resolve Date Initiated: 03/28/2021 Target Resolution Date: 05/31/2021 Goal Status: Active Interventions: Assess for signs and  symptoms of osteomyelitis resolution every visit Treatment Activities: MRI : 04/02/2021 Systemic antibiotics : 03/28/2021 Notes: Wound/Skin Impairment Nursing Diagnoses: Impaired tissue integrity Goals: Patient/caregiver will verbalize understanding of skin care regimen Date Initiated: 03/28/2021 Target Resolution Date: 05/31/2021 Goal Status: Active Ulcer/skin breakdown will have a volume reduction of 30% by week 4 Date Initiated: 03/28/2021 Target Resolution Date: 05/31/2021 Goal Status: Active Interventions: Assess patient/caregiver ability to obtain necessary supplies Assess patient/caregiver ability to perform ulcer/skin care regimen upon admission and as needed Assess ulceration(s) every visit Provide education on ulcer and skin care Treatment Activities: Topical wound management initiated : 03/28/2021 Notes: Electronic Signature(s) Signed: 05/06/2021 4:39:43 PM By: Baruch Gouty RN, BSN Entered By: Baruch Gouty on 05/06/2021 09:42:11 -------------------------------------------------------------------------------- Negative Pressure Wound Therapy Maintenance (NPWT) Details Patient Name: Date of Service: FONG, MCCARRY 05/06/2021 9:45 A M Medical Record Number: [id number] Patient Account Number: [id number] Date of Birth/Sex: Treating RN: [birth year] (56 y.o. Ernestene Mention Primary Care Romina Divirgilio: [name] Other Clinician: Referring Leonor Darnell: Treating Breane Grunwald/Extender: [name] Weeks in Treatment: 5 NPWT Maintenance Performed for: Wound #1 Midline Back Performed By: Baruch Gouty, RN Type: VAC System Coverage Size (sq cm): 55.86 Pressure Type: Constant Pressure Setting: 125 mmHG Drain Type: None Sponge/Dressing Type: Combination : white and black foam Date Initiated: 03/28/2021 Dressing Removed: No Quantity of Sponges/Gauze Removed: 3 Canister Changed: No Canister Exudate Volume: 100 Dressing Reapplied: No Quantity of Sponges/Gauze  Inserted: 3 Respones T Treatment: o good Days On NPWT : 40 Post Procedure Diagnosis Same as Pre-procedure Electronic Signature(s) Signed: 05/06/2021 4:39:43 PM By: Baruch Gouty RN, BSN Entered By: Baruch Gouty on 05/06/2021 09:52:25 -------------------------------------------------------------------------------- Pain Assessment Details Patient Name: Date of Service: [name] 05/06/2021 9:45 A M Medical Record Number: [id number] Patient Account Number: [id number] Date of Birth/Sex: Treating RN: [birth year] (56 y.o. Ernestene Mention Primary Care Cherolyn Behrle: [name] Other Clinician: Referring Raymone Pembroke: Treating Zailyn Rowser/Extender: [name] Weeks in Treatment: 5 Active Problems Location of Pain Severity and Description of Pain Patient Has Paino Yes Site Locations Pain Location: Pain in Ulcers With Dressing Change: Yes Duration of the Pain. Constant / Intermittento Constant Rate the pain. Current Pain Level: 7 Least Pain Level: 6 Character of Pain Describe the Pain: Aching Pain Management and Medication Current Pain Management: Medication: Yes Is the Current Pain Management Adequate: Adequate How does your wound impact your activities of daily livingo Sleep: No Bathing: No Appetite: No Relationship With Others: No Bladder Continence: No Emotions: No Bowel Continence: No Hobbies: Yes Toileting: No Dressing:  No Electronic Signature(s) Signed: 05/06/2021 4:39:43 PM By: Baruch Gouty RN, BSN Entered By: Baruch Gouty on 05/06/2021 09:36:48 -------------------------------------------------------------------------------- Patient/Caregiver Education Details Patient Name: Date of Service: [name] 1/23/2023andnbsp9:45 [place] Record Number: [id number] Patient Account Number: [id number] Date of Birth/Gender: Treating RN: 1966/01/09 (56 y.o. Ernestene Mention Primary Care Physician: [name] Other Clinician: Referring  Physician: Treating Physician/Extender: [name] in Treatment: 5 Education Assessment Education Provided To: Patient Education Topics Provided Infection: Methods: Explain/Verbal Responses: Reinforcements needed, State content correctly Wound/Skin Impairment: Methods: Explain/Verbal Responses: Reinforcements needed, State content correctly Electronic Signature(s) Signed: 05/06/2021 4:39:43 PM By: Baruch Gouty RN, BSN Entered By: Baruch Gouty on 05/06/2021 09:42:33 -------------------------------------------------------------------------------- Wound Assessment Details Patient Name: Date of Service: [name] 05/06/2021 9:45 A M Medical Record Number: [id number] Patient Account Number: [id number] Date of Birth/Sex: Treating RN: Aug 16, 1965 (56 y.o. [name] Primary Care Jo-Ann Johanning: [name] Other Clinician: Referring Shadiamond Koska: Treating Agness Sibrian/Extender: [name] Weeks in Treatment: 5 Wound Status Wound Number: 1 Primary Etiology: Open Surgical Wound Wound Location: Midline Back Wound Status: Open Wounding Event: Surgical Injury Comorbid History: Hypertension, Type II Diabetes, Osteomyelitis Date Acquired: 01/11/2021 Weeks Of Treatment: 5 Clustered Wound: No Photos Wound Measurements Length: (cm) 13.3 Width: (cm) 4.2 Depth: (cm) 2 Area: (cm) 43.872 Volume: (cm) 87.745 % Reduction in Area: 42% % Reduction in Volume: 49.5% Epithelialization: Small (1-33%) Tunneling: Yes Position (o'clock): 12 Maximum Distance: (cm) 3.6 Undermining: No Wound Description Classification: Full Thickness Without Exposed Support Structures Wound Margin: Well defined, not attached Exudate Amount: Medium Exudate Type: Serosanguineous Exudate Color: red, brown Foul Odor After Cleansing: No Slough/Fibrino Yes Wound Bed Granulation Amount: Large (67-100%) Exposed Structure Granulation Quality: Red, Pink Fascia Exposed:  No Necrotic Amount: None Present (0%) Fat Layer (Subcutaneous Tissue) Exposed: Yes Tendon Exposed: No Muscle Exposed: No Joint Exposed: No Bone Exposed: No Treatment Notes Wound #1 (Back) Wound Laterality: Midline Cleanser Peri-Wound Care Topical Primary Dressing wound VAC Secondary Dressing Secured With Compression Wrap Compression Stockings Add-Ons Electronic Signature(s) Signed: 05/06/2021 3:38:08 PM By: Lorrin Jackson Signed: 05/06/2021 4:39:43 PM By: Baruch Gouty RN, BSN Entered By: Baruch Gouty on 05/06/2021 09:39:38 -------------------------------------------------------------------------------- [place] Details Patient Name: Date of Service: [name] 05/06/2021 9:45 A M Medical Record Number: [id number] Patient Account Number: [id number] Date of Birth/Sex: Treating RN: September 24, 1965 (55 y.o. Ernestene Mention Primary Care Meriah Shands: [name] Other Clinician: Referring Mishti Swanton: Treating Brylie Sneath/Extender: [name] Weeks in Treatment: 5 Vital Signs Time Taken: 09:34 Temperature (F): 98.4 Height (in): 74 Pulse (bpm): 101 Source: Stated Respiratory Rate (breaths/min): 18 Source: Stated Blood Pressure (mmHg): 111/76 Capillary Blood Glucose (mg/dl): 101 Reference Range: 80 - 120 mg / dl Notes glucose per pt report this am, Hgb A1C 5.1 last week Electronic Signature(s) Signed: 05/06/2021 4:39:43 PM By: Baruch Gouty RN, BSN Entered By: Baruch Gouty on 05/06/2021 09:35:55

## 2021-05-06 NOTE — Progress Notes (Signed)
[name], [name] ([id number]) Visit Report for 05/06/2021 Chief Complaint Document Details Patient Name: Date of Service: [name], [name] 05/06/2021 9:45 A M Medical Record Number: [id number] Patient Account Number: [id number] Date of Birth/Sex: Treating RN: Mar 28, 1966 (56 y.o. [name] Primary Care Provider: Kathlene Murphy Other Clinician: Referring Provider: Treating Provider/Extender: [name]: 5 Information Obtained from: Patient Chief Complaint Back wound Electronic Signature(s) Signed: 05/06/2021 10:17:52 AM By: Christian Shan DO Entered By: [name] on 05/06/2021 10:13:37 -------------------------------------------------------------------------------- HPI Details Patient Name: Date of Service: [name]. 05/06/2021 9:45 A M Medical Record Number: [id number] Patient Account Number: [id number] Date of Birth/Sex: Treating RN: July 21, 1965 (56 y.o. [name] Primary Care Provider: Kathlene Murphy Other Clinician: Referring Provider: Treating Provider/Extender: [name]: 5 History of Present Illness HPI Description: Admission 03/28/2021 [name] is a 56 year old male with a past medical history of controlled type 2 diabetes on oral agents, obesity and OSA that presents to the clinic for a back wound. On 01/11/2021 patient had a laminectomy with PLIF of L1-S1 by Dr. Venetia Constable because of lumbar stenosis and radiculopathy. He subsequently developed bacteremia. He had CT imaging on 10/13 of the lumbar spine that showed fluid collection in the soft tissue of the posterior L1 and S1 and was taken to the OR for washout on 10/14. 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 He received 4 weeks of IV antibiotics by infectious disease. After his completion of 4 weeks of IV antibiotics he was continued for an additional 4 weeks of IV cefazolin with a stop date of 12/29. He has  been evaluated by plastic surgery and no plans for surgical intervention at this time. Wife is present and reports he has been on the wound VAC for the past 8 weeks with improvement in wound healing. He currently denies systemic signs of infection. 12/22; patient presents for follow-up. He reports no issues since last clinic visit. He denies signs of infection. He has been tolerating the wound VAC well. 12/30; patient presents for follow-up. He reports no issues and has no complaints today. He has been tolerating the wound VAC well. 1/9; patient presents for follow-up. He has no issues or complaints today. He states he feels well. He has had no problems with the wound VAC. 1/16; patient presents for follow-up. He continues to use the wound VAC with no issues. He denies signs of infection. 1/23; patient presents for follow-up. He has been switched from IV cefazolin to oral cefadroxil by infectious disease. He has no issues or complaints today. He denies signs of infection. He continues to tolerate the wound VAC well. Electronic Signature(s) Signed: 05/06/2021 10:17:52 AM By: Christian Shan DO Entered By: [name] on 05/06/2021 10:15:10 -------------------------------------------------------------------------------- Physical Exam Details Patient Name: Date of Service: [name] 05/06/2021 9:45 A M Medical Record Number: [id number] Patient Account Number: [id number] Date of Birth/Sex: Treating RN: 09-Jul-1965 (56 y.o. [name] Primary Care Provider: Kathlene Murphy Other Clinician: Referring Provider: Treating Provider/Extender: [name]: 5 Constitutional respirations regular, non-labored and within target range for patient.Marland Kitchen Psychiatric pleasant and cooperative. Notes Large open wound to the back. Granulation tissue throughout. There is undermining and tunneling. No probing to bone. No signs of infection. Electronic Signature(s) Signed:  05/06/2021 10:17:52 AM By: Christian Shan DO Entered By: [name] on 05/06/2021 10:15:31 -------------------------------------------------------------------------------- Physician Orders Details Patient Name: Date of  Service: [name] 05/06/2021 9:45 A M Medical Record Number: [id number] Patient Account Number: [id number] Date of Birth/Sex: Treating RN: 05-24-1965 (56 y.o. 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 - Monday with Dr. Heber [place]day Return A Nurse Visit: - Thursday for vac change - ***60 minutes for vac change*** Bathing/ Shower/ Hygiene May shower with protection but do not get wound dressing(s) wet. Negative Presssure Wound Therapy Wound Vac to wound continuously at 167mm/hg pressure Black Foam - wound base then bridge to right side White Foam - T tunnel at 12:00 o Additional Orders / Instructions Follow Nutritious Diet - Continue to monitor blood sugars daily Wound Murphy Wound #1 - Back Wound Laterality: Midline Prim Dressing: wound VAC ary 2 x Per Week/30 Days Electronic Signature(s) Signed: 05/06/2021 10:17:52 AM By: Christian Shan DO Entered By: [name] on 05/06/2021 10:16:16 -------------------------------------------------------------------------------- Problem List Details Patient Name: Date of Service: [name]. 05/06/2021 9:45 A M Medical Record Number: [id number] Patient Account Number: [id number] Date of Birth/Sex: Treating RN: 03/05/1966 (56 y.o. 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 05/06/2021 9:45 A M Medical Record Number: [id number] Patient Account Number: [id number] Date of Birth/Sex: Treating RN: 1965/05/05 (56 y.o. [name] Primary Care Provider: Kathlene Murphy Other Clinician: Referring Provider: Treating Provider/Extender: [name]: 5 Subjective Chief Complaint Information obtained from Patient Back wound History of Present Illness (HPI) Admission 03/28/2021 [name] is a 56 year old male with a past medical history of controlled type 2 diabetes on oral agents, obesity and OSA that presents to the clinic for a back wound. On 01/11/2021 patient had a laminectomy with PLIF of L1-S1 by Dr. Venetia Constable because of lumbar stenosis and radiculopathy. He subsequently developed bacteremia. He had CT imaging on 10/13 of the lumbar spine that showed fluid collection in the soft tissue of the posterior L1 and S1 and was taken to the OR for washout on 10/14. 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 He received 4 weeks of IV antibiotics by infectious disease. After his completion of 4 weeks of IV antibiotics he was continued for an additional 4 weeks of IV cefazolin with a stop date of 12/29. He has been evaluated  by plastic surgery and no plans for surgical intervention at this time. Wife is present and reports he has been on the wound VAC for the past 8 weeks with improvement in wound healing. He currently denies systemic signs of infection. 12/22; patient presents for follow-up. He reports no issues since last clinic visit. He denies signs of infection. He has been tolerating the wound VAC well. 12/30; patient  presents for follow-up. He reports no issues and has no complaints today. He has been tolerating the wound VAC well. 1/9; patient presents for follow-up. He has no issues or complaints today. He states he feels well. He has had no problems with the wound VAC. 1/16; patient presents for follow-up. He continues to use the wound VAC with no issues. He denies signs of infection. 1/23; patient presents for follow-up. He has been switched from IV cefazolin to oral cefadroxil by infectious disease. He has no issues or complaints today. He denies signs of infection. He continues to tolerate the wound VAC well. Patient History Information obtained from Patient. Family History Diabetes - Mother, Stroke - Siblings, No family history of Cancer, Heart Disease, Hereditary Spherocytosis, Hypertension, Kidney Disease, Lung Disease, Seizures, Thyroid Problems, Tuberculosis. Social History Never smoker, Marital Status - Married, Alcohol Use - Rarely, Drug Use - No History, Caffeine Use - Rarely. Medical History Cardiovascular Patient has history of Hypertension Endocrine Patient has history of Type II Diabetes Musculoskeletal Patient has history of Osteomyelitis Medical A Surgical History Notes nd Musculoskeletal DDD Objective Constitutional respirations regular, non-labored and within target range for patient.. Vitals Time Taken: 9:34 AM, Height: 74 in, Source: Stated, Source: Stated, Temperature: 98.4 F, Pulse: 101 bpm, Respiratory Rate: 18 breaths/min, Blood Pressure: 111/76 mmHg, Capillary Blood  Glucose: 101 mg/dl. General Notes: glucose per pt report this am, Hgb A1C 5.1 last week Psychiatric pleasant and cooperative. General Notes: Large open wound to the back. Granulation tissue throughout. There is undermining and tunneling. No probing to bone. No signs of infection. Integumentary (Hair, Skin) Wound #1 status is Open. Original cause of wound was Surgical Injury. The date acquired was: 01/11/2021. The wound has been in Murphy 5 weeks. The wound is located on the Midline Back. The wound measures 13.3cm length x 4.2cm width x 2cm depth; 43.872cm^2 area and 87.745cm^3 volume. There is Fat Layer (Subcutaneous Tissue) exposed. There is no undermining noted, however, there is tunneling at 12:00 with a maximum distance of 3.6cm. There is a medium amount of serosanguineous drainage noted. The wound margin is well defined and not attached to the wound base. There is large (67-100%) red, pink granulation within the wound bed. There is no necrotic tissue within the wound bed. 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 No signs of infection on exam. He has been switched to oral cefadroxil. I recommended continuing with the wound VAC. Plan Follow-up Appointments: Return Appointment in 1 week. - Monday with Dr. Heber [place] Nurse Visit: - Thursday for vac change - ***60 minutes for vac change*** Bathing/ Shower/ Hygiene: May shower with protection but do not get wound dressing(s) wet. Negative Presssure Wound Therapy: Wound Vac to wound continuously at 11mm/hg pressure Black Foam - wound base then bridge to right side White Foam - T tunnel at 12:00 o Additional Orders / Instructions: Follow Nutritious Diet - Continue to monitor blood sugars daily WOUND #1: - Back Wound Laterality: Midline Prim Dressing: wound VAC  2 x Per Week/30 Days ary 1. Continue wound VAC 2. Follow-up in 1 week Electronic Signature(s) Signed: 05/06/2021 10:17:52 AM By: Christian Shan DO Entered By: [name] on 05/06/2021 10:17:16 -------------------------------------------------------------------------------- HxROS Details Patient Name: Date of Service: Christian Brown E. 05/06/2021 9:45 A M Medical Record Number: [id number] Patient Account Number: [id number] Date of Birth/Sex: Treating RN: 11/07/1965 (56 y.o.  [name] Primary Care Provider: Kathlene Murphy Other Clinician: Referring Provider: Treating Provider/Extender: [name]: 5 Information Obtained From Patient Cardiovascular Medical History: Positive for: Hypertension Endocrine Medical History: Positive for: Type II Diabetes Time with diabetes: Since mid 90's Treated with: Oral agents Blood sugar tested every day: Yes Tested : 2x day Musculoskeletal Medical History: Positive for: Osteomyelitis Past Medical History Notes: DDD Immunizations Pneumococcal Vaccine: Received Pneumococcal Vaccination: Yes Received Pneumococcal Vaccination On or After 60th Birthday: No Implantable Devices Yes Family and Social History Cancer: No; Diabetes: Yes - Mother; Heart Disease: No; Hereditary Spherocytosis: No; Hypertension: No; Kidney Disease: No; Lung Disease: No; Seizures: No; Stroke: Yes - Siblings; Thyroid Problems: No; Tuberculosis: No; Never smoker; Marital Status - Married; Alcohol Use: Rarely; Drug Use: No History; Caffeine Use: Rarely; Financial Concerns: No; Food, Clothing or Shelter Needs: No; Support System Lacking: No; Transportation Concerns: No Electronic Signature(s) Signed: 05/06/2021 10:17:52 AM By: Christian Shan DO Signed: 05/06/2021 3:38:08 PM By: Lorrin Jackson Entered By: [name] on 05/06/2021  10:15:16 -------------------------------------------------------------------------------- SuperBill Details Patient Name: Date of Service: [name] 05/06/2021 Medical Record Number: [id number] Patient Account Number: [id number] Date of Birth/Sex: Treating RN: [birth year] (56 y.o. 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

## 2021-05-09 ENCOUNTER — Other Ambulatory Visit: Payer: Self-pay

## 2021-05-09 ENCOUNTER — Encounter (HOSPITAL_BASED_OUTPATIENT_CLINIC_OR_DEPARTMENT_OTHER): Payer: 59 | Admitting: Internal Medicine

## 2021-05-09 DIAGNOSIS — L98424 Non-pressure chronic ulcer of back with necrosis of bone: Secondary | ICD-10-CM | POA: Diagnosis not present

## 2021-05-09 NOTE — Progress Notes (Signed)
BROWNING, SOUTHWOOD ([id number]) Visit Report for 05/09/2021 SuperBill Details Patient Name: Date of Service: [name], [name] 05/09/2021 Medical Record Number: [id number] Patient Account Number: [id number] Date of Birth/Sex: Treating RN: [birth year] (56 y.o. 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

## 2021-05-09 NOTE — Progress Notes (Signed)
SHAQUAN, MISSEY ([id number]) Visit Report for 05/09/2021 Arrival Information Details Patient Name: Date of Service: CHAYNCE, SCHAFER 05/09/2021 11:00 A M Medical Record Number: [id number] Patient Account Number: [id number] Date of Birth/Sex: Treating RN: [birth year] (56 y.o. [name] Primary Care Ashyla Luth: [name] Other Clinician: Referring Emmakate Hypes: Treating Lalitha Ilyas/Extender: [name] in Treatment: 6 Visit Information History Since Last Visit Added or deleted any medications: No Patient Arrived: Wheel Chair Any new allergies or adverse reactions: No Arrival Time: 11:04 Had a fall or experienced change in No Transfer Assistance: None activities of daily living that may affect Patient Identification Verified: Yes risk of falls: Secondary Verification Process Completed: Yes Signs or symptoms of abuse/neglect since last visito No Patient Requires Transmission-Based Precautions: No Hospitalized since last visit: No Patient Has Alerts: Yes Implantable device outside of the clinic excluding No Patient Alerts: Patient on Blood Thinner cellular tissue based products placed in the center No BP Right Arm-PICC since last visit: Has Dressing in Place as Prescribed: Yes Pain Present Now: No Electronic Signature(s) Signed: 05/09/2021 5:00:57 PM By: Lorrin Jackson Entered By: Lorrin Jackson on 05/09/2021 11:04:28 -------------------------------------------------------------------------------- Encounter Discharge Information Details Patient Name: Date of Service: [name] 05/09/2021 11:00 A M Medical Record Number: [id number] Patient Account Number: [id number] Date of Birth/Sex: Treating RN: 16-Nov-1965 (56 y.o. [name] Primary Care Wilberth Damon: [name] Other Clinician: Referring Kaliana Albino: Treating Emma Schupp/Extender: [name] Weeks in Treatment: 6 Encounter Discharge Information Items Discharge Condition: Stable Ambulatory  Status: Wheelchair Discharge Destination: Home Transportation: Private Auto Schedule Follow-up Appointment: Yes Clinical Summary of Care: Provided on 05/09/2021 Form Type Recipient Paper Patient Patient Electronic Signature(s) Signed: 05/09/2021 5:00:57 PM By: Lorrin Jackson Entered By: Lorrin Jackson on 05/09/2021 11:38:49 -------------------------------------------------------------------------------- Negative Pressure Wound Therapy Maintenance (NPWT) Details Patient Name: Date of Service: REI, MEDLEN 05/09/2021 11:00 A M Medical Record Number: [id number] Patient Account Number: [id number] Date of Birth/Sex: Treating RN: 1965-07-30 (56 y.o. [name] Primary Care Jazlynne Milliner: [name] Other Clinician: Referring Margues Filippini: Treating Levonte Molina/Extender: [name] Weeks in Treatment: 6 NPWT Maintenance Performed for: Wound #1 Midline Back Performed By: Lorrin Jackson, RN Type: VAC System Coverage Size (sq cm): 55.86 Pressure Type: Constant Pressure Setting: 125 mmHG Drain Type: None Sponge/Dressing Type: Combination : White, Black Date Initiated: 03/28/2021 Dressing Removed: Yes Canister Changed: Yes Dressing Reapplied: Yes Days On NPWT : 43 Electronic Signature(s) Signed: 05/09/2021 5:00:57 PM By: Lorrin Jackson Entered By: Lorrin Jackson on 05/09/2021 11:04:06 -------------------------------------------------------------------------------- Patient/Caregiver Education Details Patient Name: Date of Service: [name] 1/26/2023andnbsp11:00 [place] Record Number: [id number] Patient Account Number: [id number] Date of Birth/Gender: Treating RN: [birth year] (56 y.o. [name] Primary Care Physician: [name] Other Clinician: Referring Physician: Treating Physician/Extender: [name] in Treatment: 6 Education Assessment Education Provided To: Patient Education Topics Provided Wound/Skin  Impairment: Methods: Explain/Verbal, Printed Responses: State content correctly Electronic Signature(s) Signed: 05/09/2021 5:00:57 PM By: Lorrin Jackson Entered By: Lorrin Jackson on 05/09/2021 11:38:30 -------------------------------------------------------------------------------- Wound Assessment Details Patient Name: Date of Service: [name] 05/09/2021 11:00 A M Medical Record Number: [id number] Patient Account Number: [id number] Date of Birth/Sex: Treating RN: 1965-12-14 (56 y.o. [name] Primary Care Maylani Embree: [name] Other Clinician: Referring Erlean Mealor: Treating Adreena Willits/Extender: [name] Weeks in Treatment: 6 Wound Status Wound Number: 1 Primary Etiology: Open Surgical Wound Wound Location: Midline Back Wound Status: Open Wounding Event: Surgical Injury Comorbid History: Hypertension,  Type II Diabetes, Osteomyelitis Date Acquired: 01/11/2021 Weeks Of Treatment: 6 Clustered Wound: No Wound Measurements Length: (cm) 13 Width: (cm) 3.8 Depth: (cm) 2 Area: (cm) 38.799 Volume: (cm) 77.597 % Reduction in Area: 48.7% % Reduction in Volume: 55.4% Epithelialization: Small (1-33%) Tunneling: Yes Position (o'clock): 12 Maximum Distance: (cm) 3.4 Undermining: Yes Starting Position (o'clock): 2 Ending Position (o'clock): 4 Maximum Distance: (cm) 2 Wound Description Classification: Full Thickness Without Exposed Support Structures Wound Margin: Well defined, not attached Exudate Amount: Medium Exudate Type: Serosanguineous Exudate Color: red, brown Foul Odor After Cleansing: No Slough/Fibrino Yes Wound Bed Granulation Amount: Large (67-100%) Exposed Structure Granulation Quality: Red, Pink Fascia Exposed: No Necrotic Amount: None Present (0%) Fat Layer (Subcutaneous Tissue) Exposed: Yes Tendon Exposed: No Muscle Exposed: No Joint Exposed: No Bone Exposed: No Treatment Notes Wound #1 (Back) Wound Laterality:  Midline Cleanser Peri-Wound Care Topical Primary Dressing wound VAC Secondary Dressing Secured With Compression Wrap Compression Stockings Add-Ons Notes Wound vac applied Electronic Signature(s) Signed: 05/09/2021 5:00:57 PM By: Lorrin Jackson Entered By: Lorrin Jackson on 05/09/2021 11:13:19 -------------------------------------------------------------------------------- Vitals Details Patient Name: Date of Service: [name] 05/09/2021 11:00 A M Medical Record Number: [id number] Patient Account Number: [id number] Date of Birth/Sex: Treating RN: September 14, 1965 (56 y.o. [name] Primary Care Larrissa Stivers: [name] Other Clinician: Referring Jeannett Dekoning: Treating Karesa Maultsby/Extender: [name] Weeks in Treatment: 6 Vital Signs Time Taken: 11:13 Temperature (F): 98 Height (in): 74 Pulse (bpm): 98 Respiratory Rate (breaths/min): 18 Blood Pressure (mmHg): 110/80 Reference Range: 80 - 120 mg / dl Electronic Signature(s) Signed: 05/09/2021 5:00:57 PM By: Lorrin Jackson Entered By: Lorrin Jackson on 05/09/2021 11:13:56

## 2021-05-10 ENCOUNTER — Encounter: Payer: Self-pay | Admitting: Physical Medicine and Rehabilitation

## 2021-05-10 ENCOUNTER — Encounter
Payer: No Typology Code available for payment source | Attending: Physical Medicine and Rehabilitation | Admitting: Physical Medicine and Rehabilitation

## 2021-05-10 ENCOUNTER — Other Ambulatory Visit (HOSPITAL_COMMUNITY): Payer: Self-pay

## 2021-05-10 VITALS — BP 131/85 | HR 108 | Temp 98.2°F | Ht 74.0 in

## 2021-05-10 DIAGNOSIS — G629 Polyneuropathy, unspecified: Secondary | ICD-10-CM

## 2021-05-10 DIAGNOSIS — G8928 Other chronic postprocedural pain: Secondary | ICD-10-CM | POA: Diagnosis present

## 2021-05-10 DIAGNOSIS — T847XXD Infection and inflammatory reaction due to other internal orthopedic prosthetic devices, implants and grafts, subsequent encounter: Secondary | ICD-10-CM

## 2021-05-10 DIAGNOSIS — M5106 Intervertebral disc disorders with myelopathy, lumbar region: Secondary | ICD-10-CM

## 2021-05-10 DIAGNOSIS — G062 Extradural and subdural abscess, unspecified: Secondary | ICD-10-CM | POA: Diagnosis present

## 2021-05-10 MED ORDER — PROMETHAZINE HCL 12.5 MG PO TABS
12.5000 mg | ORAL_TABLET | Freq: Four times a day (QID) | ORAL | 5 refills | Status: DC | PRN
  Filled 2021-05-10: qty 90, 23d supply, fill #0

## 2021-05-10 MED ORDER — HYDROMORPHONE HCL 2 MG PO TABS
2.0000 mg | ORAL_TABLET | ORAL | 0 refills | Status: DC
Start: 2021-05-10 — End: 2021-07-12
  Filled 2021-05-10: qty 28, 66d supply, fill #0

## 2021-05-10 MED ORDER — MORPHINE SULFATE ER 15 MG PO TBCR
30.0000 mg | EXTENDED_RELEASE_TABLET | Freq: Two times a day (BID) | ORAL | 0 refills | Status: DC
Start: 2021-05-10 — End: 2021-06-05
  Filled 2021-05-10: qty 60, 15d supply, fill #0

## 2021-05-10 MED ORDER — OXYCODONE HCL 15 MG PO TABS
15.0000 mg | ORAL_TABLET | Freq: Three times a day (TID) | ORAL | 0 refills | Status: DC | PRN
Start: 2021-05-10 — End: 2021-08-12
  Filled 2021-05-10: qty 90, 30d supply, fill #0

## 2021-05-10 NOTE — Progress Notes (Signed)
Subjective:    Patient ID: [name], male    DOB: [birth year], 56 y.o.   MRN: [id number]  HPI Pt is a 56 yr old male with lumbar radiculopathy/myelopathy secondary to nerve root compression and bacteremia from Klebsiella pneumonia and large back incision with neurogenic bowel and bladder and choric pain now as a result  Here for hospital f/u on new SCI.    Finally got new w/c- 2 weeks ago- [facility].   Is about due to get Pain meds- last refilled 03/15/21-  Needs dilaudid for wound VAC changes still- changing  Monday/Thursday.   Pain slowly getting better-  Oxy takes 1 tab 1-2/day usually- not q4-6 hours.  Taking Zanaflex- 4 mg 2x/day-  Pain is where has screw sideway per pt. Gabapentin 600 mg BID not TID.    Still having nausea- but is somewhat better- still taking zofran- takes ~1x/day- usually- Afraid of vomiting and coughing  Bladder- still cathing- went to urology- can take 6 months to  heal-  Bowels- moves fairly well- sometimes goes every 1-2 days- but then takes miralax and cleans out of need be.   Not taking Valium or flexeril anymore.    Hasn't started outpt therapy- to start in February - balance getting better.  Walking around the house- with RW- sometimes if sits in chair a long time; then tries to get up- loses strength a little.   Done with IV ABX x 1 week-   Down to 248 lbs since couldn't eat- due to IV ABX-  Can't stand carbonated drinks anymore.  A1c 5.1- was 345 lbs when started.   Pain Inventory Average Pain 7 Pain Right Now 7 My pain is dull  In the last 24 hours, has pain interfered with the following? General activity 4 Relation with others 8 Enjoyment of life 0 What TIME of day is your pain at its worst? morning  Sleep (in general) Good  Pain is worse with: sitting Pain improves with: medication Relief from Meds: 8  Family History  Problem Relation Age of Onset   Diabetes Mother    Hypertension Neg Hx    Coronary artery  disease Neg Hx    Colon cancer Neg Hx    Prostate cancer Neg Hx    Social History   Socioeconomic History   Marital status: Married    Spouse name: [name]   Number of children: 2   Years of education: Not on file   Highest education level: Not on file  Occupational History   Occupation: Therapist, art: [employer]   Occupation: Music therapist: [employer]  Tobacco Use   Smoking status: Never   Smokeless tobacco: Never  Scientific laboratory technician Use: Never used  Substance and Sexual Activity   Alcohol use: No   Drug use: No   Sexual activity: Yes  Other Topics Concern   Not on file  Social History Narrative   Remarried for the 3rd time w/ a lady for the [place]   Children:  2010 , 2012   Left handed                Social Determinants of Health   Financial Resource Strain: Not on file  Food Insecurity: Not on file  Transportation Needs: Not on file  Physical Activity: Not on file  Stress: Not on file  Social Connections: Not on file   Past Surgical History:  Procedure Laterality  Date   APPLICATION OF ROBOTIC ASSISTANCE FOR SPINAL PROCEDURE N/A 01/11/2021   Procedure: APPLICATION OF ROBOTIC ASSISTANCE FOR SPINAL PROCEDURE;  Surgeon: Judith Part, MD;  Location: [facility];  Service: Neurosurgery;  Laterality: N/A;   APPLICATION OF WOUND VAC N/A 01/25/2021   Procedure: APPLICATION OF WOUND VAC;  Surgeon: Dawley, Theodoro Doing, DO;  Location: [facility];  Service: Neurosurgery;  Laterality: N/A;   BACK SURGERY     COLONOSCOPY  2019   LUMBAR WOUND DEBRIDEMENT N/A 01/25/2021   Procedure: Irrigation and Debridement of lumbar wound, and wound vacuum assisted closure;  Surgeon: Dawley, Theodoro Doing, DO;  Location: [facility];  Service: Neurosurgery;  Laterality: N/A;   TRANSFORAMINAL LUMBAR INTERBODY FUSION (TLIF) WITH PEDICLE SCREW FIXATION 4 LEVEL N/A 01/11/2021   Procedure: Lumbar one-two, Lumbar two-three, Lumbar three-four, Lumbar four-five, Lumbar five  Sacral one Open decompression, Transforaminal lumbar interbody fusion, posterolateral instrumented fusion;  Surgeon: Judith Part, MD;  Location: [facility];  Service: Neurosurgery;  Laterality: N/A;   Past Surgical History:  Procedure Laterality Date   APPLICATION OF ROBOTIC ASSISTANCE FOR SPINAL PROCEDURE N/A 01/11/2021   Procedure: APPLICATION OF ROBOTIC ASSISTANCE FOR SPINAL PROCEDURE;  Surgeon: Judith Part, MD;  Location: [place] [facility];  Service: Neurosurgery;  Laterality: N/A;   APPLICATION OF WOUND VAC N/A 01/25/2021   Procedure: APPLICATION OF WOUND VAC;  Surgeon: Dawley, Theodoro Doing, DO;  Location: [facility];  Service: Neurosurgery;  Laterality: N/A;   BACK SURGERY     COLONOSCOPY  2019   LUMBAR WOUND DEBRIDEMENT N/A 01/25/2021   Procedure: Irrigation and Debridement of lumbar wound, and wound vacuum assisted closure;  Surgeon: Dawley, Theodoro Doing, DO;  Location: [facility];  Service: Neurosurgery;  Laterality: N/A;   TRANSFORAMINAL LUMBAR INTERBODY FUSION (TLIF) WITH PEDICLE SCREW FIXATION 4 LEVEL N/A 01/11/2021   Procedure: Lumbar one-two, Lumbar two-three, Lumbar three-four, Lumbar four-five, Lumbar five Sacral one Open decompression, Transforaminal lumbar interbody fusion, posterolateral instrumented fusion;  Surgeon: Judith Part, MD;  Location: [facility];  Service: Neurosurgery;  Laterality: N/A;   Past Medical History:  Diagnosis Date   DDD (degenerative disc disease), lumbar    Diabetes mellitus    Hyperlipemia    Hypertension    Obesity    OSA (obstructive sleep apnea)    on CPAP   BP 131/85    Pulse (!) 108    Temp 98.2 F (36.8 C)    Ht 6\' 2"  (1.88 m)    SpO2 97%    BMI 31.91 kg/m   Opioid Risk Score:   Fall Risk Score:  `1  Depression screen PHQ 2/9  Depression screen [facility] 2/9 05/10/2021 03/12/2021 12/03/2020 09/28/2020 09/14/2020 02/10/2020 03/02/2017  Decreased Interest 0 0 0 0 0 0 0  Down, Depressed, Hopeless 0 0 0 0 0 0 0  PHQ - 2 Score 0 0 0 0 0 0 0  Altered sleeping - - - 0  - - -  Tired, decreased energy - - - 0 - - -  Change in appetite - - - 0 - - -  Feeling bad or failure about yourself  - - - 0 - - -  Trouble concentrating - - - 0 - - -  Moving slowly or fidgety/restless - - - 0 - - -  Suicidal thoughts - - - 0 - - -  PHQ-9 Score - - - 0 - - -  Some recent data might be hidden     Review of Systems  Constitutional: Negative.   HENT: Negative.    Eyes: Negative.   Respiratory: Negative.    Cardiovascular: Negative.   Gastrointestinal: Negative.   Endocrine: Negative.   Genitourinary: Negative.   Musculoskeletal:  Positive for back pain and gait problem.  Skin: Negative.   Allergic/Immunologic: Negative.   Hematological: Negative.   Psychiatric/Behavioral: Negative.        Objective:   Physical Exam  Awake, alert, appropriate, obese; accompanied by wife, in manual w/c, NAD Wound VAC on lumbar incision- looks ~ 30% improved in size-   MS: B/L LE's 4/5 in HF, KE, DF and PF B/L    Assessment & Plan:   Pt is a 56 yr old male with lumbar radiculopathy/myelopathy secondary to nerve root compression and bacteremia from Klebsiella pneumonia and large back incision with neurogenic bowel and bladder and choric pain now as a result  Here for hospital f/u on new SCI.   Will refill MS Contin 15 mg BID- for chronic pain.  2. Dilaudid 2 mg  for wound VAC changes M/W/F #28 3. Refill Oxycodone 15 mg 3x/day prn #90  4. Gets constipation from Zofran so will also send in Phenergan 12.5 mg up to 3x/day as needed for nausea- #90- 5 refills.   5. Has therapy scheduled- in February-   6. Doesn't need trigger point injections today- doing better.    7. Feels like nerve and chronic pain pretty well controlled- can only get pain to 7/10- due to spot where screw is.   8. Has Rx for Narcan just in case.    9. Let me know when needs refills.   10. F/U in 3 months- double appt.

## 2021-05-10 NOTE — Patient Instructions (Signed)
Pt is a 56 yr old male with lumbar radiculopathy/myelopathy secondary to nerve root compression and bacteremia from Klebsiella pneumonia and large back incision with neurogenic bowel and bladder and choric pain now as a result  Here for hospital f/u on new SCI.   Will refill MS Contin 15 mg BID- for chronic pain.  2. Dilaudid 2 mg  for wound VAC changes M/W/F #28 3. Refill Oxycodone 15 mg 3x/day prn #90  4. Gets constipation from Zofran so will also send in Phenergan 12.5 mg up to 3x/day as needed for nausea- #90- 5 refills. Can take 2 pills at a time if need be.   5. Has therapy scheduled- in February-   6. Doesn't need trigger point injections today- doing better.    7. Feels like nerve and chronic pain pretty well controlled- can only get pain to 7/10- due to spot where screw is.   8. Has Rx for Narcan just in case.    9. Let me know when needs refills. My my chart  10. F/U in 3 months- double appt.

## 2021-05-13 ENCOUNTER — Encounter (HOSPITAL_BASED_OUTPATIENT_CLINIC_OR_DEPARTMENT_OTHER): Payer: 59 | Admitting: Internal Medicine

## 2021-05-13 ENCOUNTER — Other Ambulatory Visit: Payer: Self-pay

## 2021-05-13 ENCOUNTER — Other Ambulatory Visit (HOSPITAL_COMMUNITY): Payer: Self-pay

## 2021-05-13 DIAGNOSIS — M869 Osteomyelitis, unspecified: Secondary | ICD-10-CM | POA: Diagnosis not present

## 2021-05-13 DIAGNOSIS — E11622 Type 2 diabetes mellitus with other skin ulcer: Secondary | ICD-10-CM | POA: Diagnosis not present

## 2021-05-13 DIAGNOSIS — L98424 Non-pressure chronic ulcer of back with necrosis of bone: Secondary | ICD-10-CM | POA: Diagnosis not present

## 2021-05-13 NOTE — Progress Notes (Signed)
[name], [name] ([id number]) Visit Report for 05/13/2021 Chief Complaint Document Details Patient Name: Date of Service: [name], [name] 05/13/2021 9:30 A M Medical Record Number: [id number] Patient Account Number: [id number] Date of Birth/Sex: Treating RN: 30-Aug-1965 (56 y.o. [name] Primary Care Provider: [name] Other Clinician: Referring Provider: Treating Provider/Extender: [name] Weeks in Treatment: 6 Information Obtained from: Patient Chief Complaint Back wound Electronic Signature(s) Signed: 05/13/2021 12:30:54 PM By: Kalman Shan DO Entered By: Kalman Shan on 05/13/2021 12:28:00 -------------------------------------------------------------------------------- HPI Details Patient Name: Date of Service: Christian Brown E. 05/13/2021 9:30 A M Medical Record Number: [id number] Patient Account Number: [id number] Date of Birth/Sex: Treating RN: Sep 13, 1965 (56 y.o. [name] Primary Care Provider: [name] Other Clinician: Referring Provider: Treating Provider/Extender: [name] Weeks in Treatment: 6 History of Present Illness HPI Description: Admission 03/28/2021 [name] is a 56 year old male with a past medical history of controlled type 2 diabetes on oral agents, obesity and OSA that presents to the clinic for a back wound. On 01/11/2021 patient had a laminectomy with PLIF of L1-S1 by Dr. Venetia Murphy because of lumbar stenosis and radiculopathy. He subsequently developed bacteremia. He had CT imaging on 10/13 of the lumbar spine that showed fluid collection in the soft tissue of the posterior L1 and S1 and was taken to the OR for washout on 10/14. 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 He received 4 weeks of IV antibiotics by infectious disease. After his completion of 4 weeks of IV antibiotics he was continued for an additional 4 weeks of IV cefazolin with a stop date of 12/29. He has  been evaluated by plastic surgery and no plans for surgical intervention at this time. Wife is present and reports he has been on the wound VAC for the past 8 weeks with improvement in wound healing. He currently denies systemic signs of infection. 12/22; patient presents for follow-up. He reports no issues since last clinic visit. He denies signs of infection. He has been tolerating the wound VAC well. 12/30; patient presents for follow-up. He reports no issues and has no complaints today. He has been tolerating the wound VAC well. 1/9; patient presents for follow-up. He has no issues or complaints today. He states he feels well. He has had no problems with the wound VAC. 1/16; patient presents for follow-up. He continues to use the wound VAC with no issues. He denies signs of infection. 1/23; patient presents for follow-up. He has been switched from IV cefazolin to oral cefadroxil by infectious disease. He has no issues or complaints today. He denies signs of infection. He continues to tolerate the wound VAC well. 1/30; patient presents for follow-up. He continues to tolerate the wound VAC well. Electronic Signature(s) Signed: 05/13/2021 12:30:54 PM By: Kalman Shan DO Entered By: Kalman Shan on 05/13/2021 12:28:24 -------------------------------------------------------------------------------- Physical Exam Details Patient Name: Date of Service: Christian Brown E. 05/13/2021 9:30 A M Medical Record Number: [id number] Patient Account Number: [id number] Date of Birth/Sex: Treating RN: January 08, 1966 (56 y.o. [name] Primary Care Provider: [name] Other Clinician: Referring Provider: Treating Provider/Extender: [name] Weeks in Treatment: 6 Constitutional respirations regular, non-labored and within target range for patient.Marland Kitchen Psychiatric pleasant and cooperative. Notes Large open wound to the back. Granulation tissue throughout. There is undermining and  tunneling. No probing to bone. No signs of infection. Electronic Signature(s) Signed: 05/13/2021 12:30:54 PM By: Kalman Shan DO Entered By:  Kalman Shan on 05/13/2021 33:29:51 -------------------------------------------------------------------------------- Physician Orders Details Patient Name: Date of Service: [name] 05/13/2021 9:30 A M Medical Record Number: [id number] Patient Account Number: [id number] Date of Birth/Sex: Treating RN: Aug 28, 1965 (56 y.o. 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 - Monday with Dr. Heber Blawenburg Return A Nurse Visit: - Thursday 05/16/21 for vac change Bathing/ Shower/ Hygiene May shower with protection but do not get wound dressing(s) wet. Negative Presssure Wound Therapy Wound Vac to wound continuously at 156mm/hg pressure Black Foam - wound base then bridge to right side White Foam - T tunnel at 12:00 o Additional Orders / Instructions Follow Nutritious Diet - Continue to monitor blood sugars daily Wound Treatment Wound #1 - Back Wound Laterality: Midline Prim Dressing: wound VAC ary 2 x Per Week/30 Days Electronic Signature(s) Signed: 05/13/2021 12:30:54 PM By: Kalman Shan DO Entered By: Kalman Shan on 05/13/2021 12:29:38 -------------------------------------------------------------------------------- Problem List Details Patient Name: Date of Service: Christian Grout. 05/13/2021 9:30 A M Medical Record Number: [id number] Patient Account Number: [id number] Date of Birth/Sex: Treating RN: [birth year] (56 y.o. 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 05/13/2021 9:30 A M Medical Record Number: [id number] Patient Account Number: [id number] Date of Birth/Sex: Treating RN: [birth year] (56 y.o. [name] Primary Care Provider: [name] Other Clinician: Referring Provider: Treating Provider/Extender: [name] Weeks in Treatment: 6 Subjective Chief Complaint Information obtained from Patient Back wound History of Present Illness (HPI) Admission 03/28/2021 Mr. Kamarian Sahakian is a 56 year old male with a past medical history of controlled type 2 diabetes on oral agents, obesity and OSA that presents to the clinic for a back wound. On 01/11/2021 patient had a laminectomy with PLIF of L1-S1 by Dr. Venetia Murphy because of lumbar stenosis and radiculopathy. He subsequently developed bacteremia. He had CT imaging on 10/13 of the lumbar spine that showed fluid collection in the soft tissue of the posterior L1 and S1 and was taken to the OR for washout on 10/14. 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 He received 4 weeks of IV antibiotics by infectious disease. After his completion of 4 weeks of IV antibiotics he was continued for an additional 4  weeks of IV cefazolin with a stop date of 12/29. He has been evaluated by plastic surgery and no plans for surgical intervention at this time. Wife is present and reports he has been on the wound VAC for the past 8 weeks with improvement in wound healing. He currently denies systemic signs of infection. 12/22; patient presents for follow-up. He reports no issues since last clinic visit. He denies signs of infection. He has  been tolerating the wound VAC well. 12/30; patient presents for follow-up. He reports no issues and has no complaints today. He has been tolerating the wound VAC well. 1/9; patient presents for follow-up. He has no issues or complaints today. He states he feels well. He has had no problems with the wound VAC. 1/16; patient presents for follow-up. He continues to use the wound VAC with no issues. He denies signs of infection. 1/23; patient presents for follow-up. He has been switched from IV cefazolin to oral cefadroxil by infectious disease. He has no issues or complaints today. He denies signs of infection. He continues to tolerate the wound VAC well. 1/30; patient presents for follow-up. He continues to tolerate the wound VAC well. Patient History Information obtained from Patient. Family History Diabetes - Mother, Stroke - Siblings, No family history of Cancer, Heart Disease, Hereditary Spherocytosis, Hypertension, Kidney Disease, Lung Disease, Seizures, Thyroid Problems, Tuberculosis. Social History Never smoker, Marital Status - Married, Alcohol Use - Rarely, Drug Use - No History, Caffeine Use - Rarely. Medical History Cardiovascular Patient has history of Hypertension Endocrine Patient has history of Type II Diabetes Musculoskeletal Patient has history of Osteomyelitis Medical A Surgical History Notes nd Musculoskeletal DDD Objective Constitutional respirations regular, non-labored and within target range for patient.. Vitals Time Taken: 9:35 AM, Height: 74  in, Temperature: 98.5 F, Pulse: 100 bpm, Respiratory Rate: 18 breaths/min, Blood Pressure: 90/60 mmHg. Psychiatric pleasant and cooperative. General Notes: Large open wound to the back. Granulation tissue throughout. There is undermining and tunneling. No probing to bone. No signs of infection. Integumentary (Hair, Skin) Wound #1 status is Open. Original cause of wound was Surgical Injury. The date acquired was: 01/11/2021. The wound has been in treatment 6 weeks. The wound is located on the Midline Back. The wound measures 13cm length x 3.3cm width x 2.5cm depth; 33.694cm^2 area and 84.234cm^3 volume. There is Fat Layer (Subcutaneous Tissue) exposed. Tunneling has been noted at 12:00 with a maximum distance of 4.2cm. Undermining begins at 2:00 and ends at 4:00 with a maximum distance of 1.2cm. There is a medium amount of serosanguineous drainage noted. The wound margin is well defined and not attached to the wound base. There is large (67-100%) red, pink granulation within the wound bed. There is a small (1-33%) amount of necrotic tissue within the wound bed including Adherent Slough. 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 No signs of infection on exam. Continue wound VAC. Plan Follow-up Appointments: Return Appointment in 1 week. - Monday with Dr. Heber Iraan Nurse Visit: - Thursday 05/16/21 for vac change Bathing/ Shower/ Hygiene: May shower with protection but do not get wound dressing(s) wet. Negative Presssure Wound Therapy: Wound Vac to wound continuously at 170mm/hg pressure Black Foam - wound base then bridge to right side White Foam - T tunnel at 12:00 o Additional Orders / Instructions: Follow Nutritious Diet - Continue to monitor blood sugars daily WOUND #1: - Back Wound Laterality: Midline Prim Dressing: wound VAC 2 x Per  Week/30 Days ary 1. Continue wound VAC 2. Follow-up in 1 week Electronic Signature(s) Signed: 05/13/2021 12:30:54 PM By: Kalman Shan DO Entered By: Kalman Shan on 05/13/2021 12:30:19 -------------------------------------------------------------------------------- HxROS Details Patient Name: Date of Service: Christian Brown E. 05/13/2021 9:30 A M Medical Record Number: [id number] Patient Account Number: [id number] Date of Birth/Sex: Treating RN: 1965/09/12 (56 y.o. [name] Primary Care  Provider: [name] Other Clinician: Referring Provider: Treating Provider/Extender: [name] in Treatment: 6 Information Obtained From Patient Cardiovascular Medical History: Positive for: Hypertension Endocrine Medical History: Positive for: Type II Diabetes Time with diabetes: Since mid 90's Treated with: Oral agents Blood sugar tested every day: Yes Tested : 2x day Musculoskeletal Medical History: Positive for: Osteomyelitis Past Medical History Notes: DDD Immunizations Pneumococcal Vaccine: Received Pneumococcal Vaccination: Yes Received Pneumococcal Vaccination On or After 60th Birthday: No Implantable Devices Yes Family and Social History Cancer: No; Diabetes: Yes - Mother; Heart Disease: No; Hereditary Spherocytosis: No; Hypertension: No; Kidney Disease: No; Lung Disease: No; Seizures: No; Stroke: Yes - Siblings; Thyroid Problems: No; Tuberculosis: No; Never smoker; Marital Status - Married; Alcohol Use: Rarely; Drug Use: No History; Caffeine Use: Rarely; Financial Concerns: No; Food, Clothing or Shelter Needs: No; Support System Lacking: No; Transportation Concerns: No Electronic Signature(s) Signed: 05/13/2021 12:30:54 PM By: Kalman Shan DO Signed: 05/13/2021 4:11:37 PM By: Lorrin Jackson Entered By: Kalman Shan on 05/13/2021 12:29:06 -------------------------------------------------------------------------------- [place]  Details Patient Name: Date of Service: Christian Grout 05/13/2021 Medical Record Number: [id number] Patient Account Number: [id number] Date of Birth/Sex: Treating RN: 1965-11-08 (56 y.o. 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

## 2021-05-13 NOTE — Progress Notes (Signed)
TRAVEION, RUDDOCK ([id number]) Visit Report for 05/13/2021 Arrival Information Details Patient Name: Date of Service: DEAKIN, LACEK 05/13/2021 9:30 A M Medical Record Number: [id number] Patient Account Number: [id number] Date of Birth/Sex: Treating RN: [birth year] (56 y.o. [name] Primary Care Clarabelle Oscarson: [name] Other Clinician: Referring Jeran Hiltz: Treating Shanda Cadotte/Extender: [name] in Treatment: 6 Visit Information History Since Last Visit Added or deleted any medications: No Patient Arrived: Wheel Chair Any new allergies or adverse reactions: No Arrival Time: 09:29 Had a fall or experienced change in No Accompanied By: Friend activities of daily living that may affect Transfer Assistance: None risk of falls: Patient Identification Verified: Yes Signs or symptoms of abuse/neglect since last visito No Secondary Verification Process Completed: Yes Hospitalized since last visit: No Patient Requires Transmission-Based Precautions: No Implantable device outside of the clinic excluding No Patient Has Alerts: Yes cellular tissue based products placed in the center Patient Alerts: Patient on Blood Thinner since last visit: No BP Right Arm-PICC Has Dressing in Place as Prescribed: Yes Pain Present Now: No Electronic Signature(s) Signed: 05/13/2021 4:11:37 PM By: Lorrin Jackson Entered By: Lorrin Jackson on 05/13/2021 09:30:09 -------------------------------------------------------------------------------- Encounter Discharge Information Details Patient Name: Date of Service: [name] 05/13/2021 9:30 A M Medical Record Number: [id number] Patient Account Number: [id number] Date of Birth/Sex: Treating RN: 21-Sep-1965 (56 y.o. [name] Primary Care Zebadiah Willert: [name] Other Clinician: Referring Keilany Burnette: Treating Euel Castile/Extender: [name] in Treatment: 6 Encounter Discharge Information Items Discharge Condition:  Stable Ambulatory Status: Wheelchair Discharge Destination: Home Transportation: Private Auto Accompanied By: friend Schedule Follow-up Appointment: Yes Clinical Summary of Care: Provided on 05/13/2021 Form Type Recipient Paper Patient Patient Electronic Signature(s) Signed: 05/13/2021 11:41:40 AM By: Lorrin Jackson Entered By: Lorrin Jackson on 05/13/2021 11:41:40 -------------------------------------------------------------------------------- Lower Extremity Assessment Details Patient Name: Date of Service: JAHON, BART 05/13/2021 9:30 A M Medical Record Number: [id number] Patient Account Number: [id number] Date of Birth/Sex: Treating RN: [birth year] (56 y.o. [name] Primary Care Jahmere Bramel: [name] Other Clinician: Referring Marlyss Cissell: Treating Graciana Sessa/Extender: [name] Weeks in Treatment: 6 Electronic Signature(s) Signed: 05/13/2021 4:11:37 PM By: Lorrin Jackson Entered By: Lorrin Jackson on 05/13/2021 09:35:53 -------------------------------------------------------------------------------- Multi Wound Chart Details Patient Name: Date of Service: [name] 05/13/2021 9:30 A M Medical Record Number: [id number] Patient Account Number: [id number] Date of Birth/Sex: Treating RN: 1966-01-03 (56 y.o. [name] Primary Care Ysabela Keisler: [name] Other Clinician: Referring Keziah Avis: Treating Modupe Shampine/Extender: [name] Weeks in Treatment: 6 Vital Signs Height(in): 74 Pulse(bpm): 100 Weight(lbs): Blood Pressure(mmHg): 90/60 Body Mass Index(BMI): Temperature(F): 98.5 Respiratory Rate(breaths/min): 18 Photos: [N/A:N/A] Midline Back N/A N/A Wound Location: Surgical Injury N/A N/A Wounding Event: Open Surgical Wound N/A N/A Primary Etiology: Hypertension, Type II Diabetes, N/A N/A Comorbid History: Osteomyelitis 01/11/2021 N/A N/A Date Acquired: 6 N/A N/A Weeks of Treatment: Open N/A N/A Wound Status: No  N/A N/A Wound Recurrence: 13x3.3x2.5 N/A N/A Measurements L x W x D (cm) 33.694 N/A N/A A (cm) : rea 84.234 N/A N/A Volume (cm) : 55.40% N/A N/A % Reduction in A rea: 51.60% N/A N/A % Reduction in Volume: 12 Position 1 (o'clock): 4.2 Maximum Distance 1 (cm): 2 Starting Position 1 (o'clock): 4 Ending Position 1 (o'clock): 1.2 Maximum Distance 1 (cm): Yes N/A N/A Tunneling: Yes N/A N/A Undermining: Full Thickness Without Exposed N/A N/A Classification: Support Structures Medium N/A N/A Exudate Amount: Serosanguineous N/A N/A Exudate Type: red, brown N/A N/A  Exudate Color: Well defined, not attached N/A N/A Wound Margin: Large (67-100%) N/A N/A Granulation Amount: Red, Pink N/A N/A Granulation Quality: Small (1-33%) N/A N/A Necrotic Amount: Fat Layer (Subcutaneous Tissue): Yes N/A N/A Exposed Structures: Fascia: No Tendon: No Muscle: No Joint: No Bone: No Small (1-33%) N/A N/A Epithelialization: Negative Pressure Wound Therapy N/A N/A Procedures Performed: Maintenance (NPWT) Treatment Notes Wound #1 (Back) Wound Laterality: Midline Cleanser Peri-Wound Care Topical Primary Dressing wound VAC Secondary Dressing Secured With Compression Wrap Compression Stockings Add-Ons Electronic Signature(s) Signed: 05/13/2021 12:30:54 PM By: Kalman Shan DO Signed: 05/13/2021 4:11:37 PM By: Lorrin Jackson Entered By: Kalman Shan on 05/13/2021 12:27:51 -------------------------------------------------------------------------------- Multi-Disciplinary Care Plan Details Patient Name: Date of Service: [name] 05/13/2021 9:30 A M Medical Record Number: [id number] Patient Account Number: [id number] Date of Birth/Sex: Treating RN: 28-May-1965 (56 y.o. [name] Primary Care Leena Tiede: [name] Other Clinician: Referring Susette Seminara: Treating Yao Hyppolite/Extender: [name] in Treatment: 6 [place]  reviewed with physician Active Inactive Osteomyelitis Nursing Diagnoses: Infection: osteomyelitis Goals: Patient's osteomyelitis will resolve Date Initiated: 03/28/2021 Target Resolution Date: 05/31/2021 Goal Status: Active Interventions: Assess for signs and symptoms of osteomyelitis resolution every visit Treatment Activities: MRI : 04/02/2021 Systemic antibiotics : 03/28/2021 Notes: Wound/Skin Impairment Nursing Diagnoses: Impaired tissue integrity Goals: Patient/caregiver will verbalize understanding of skin care regimen Date Initiated: 03/28/2021 Target Resolution Date: 05/31/2021 Goal Status: Active Ulcer/skin breakdown will have a volume reduction of 30% by week 4 Date Initiated: 03/28/2021 Target Resolution Date: 05/31/2021 Goal Status: Active Interventions: Assess patient/caregiver ability to obtain necessary supplies Assess patient/caregiver ability to perform ulcer/skin care regimen upon admission and as needed Assess ulceration(s) every visit Provide education on ulcer and skin care Treatment Activities: Topical wound management initiated : 03/28/2021 Notes: Electronic Signature(s) Signed: 05/13/2021 4:11:37 PM By: Lorrin Jackson Entered By: Lorrin Jackson on 05/13/2021 09:29:23 -------------------------------------------------------------------------------- Negative Pressure Wound Therapy Maintenance (NPWT) Details Patient Name: Date of Service: MARTISE, WADDELL 05/13/2021 9:30 A M Medical Record Number: [id number] Patient Account Number: [id number] Date of Birth/Sex: Treating RN: 1966-02-22 (56 y.o. [name] Primary Care Jadarion Halbig: [name] Other Clinician: Referring Haliegh Khurana: Treating Yojan Paskett/Extender: [name] Weeks in Treatment: 6 NPWT Maintenance Performed for: Wound #1 Midline Back Performed By: Lorrin Jackson, RN Type: VAC System Coverage Size (sq cm): 42.9 Pressure Type: Constant Pressure Setting: 125 mmHG Drain  Type: None Sponge/Dressing Type: Combination : Date Initiated: 03/28/2021 Dressing Removed: Yes Canister Changed: Yes Dressing Reapplied: Yes Days On NPWT : 70 Post Procedure Diagnosis Same as Pre-procedure Electronic Signature(s) Signed: 05/13/2021 4:11:37 PM By: Lorrin Jackson Entered By: Lorrin Jackson on 05/13/2021 10:05:14 -------------------------------------------------------------------------------- Pain Assessment Details Patient Name: Date of Service: [name] 05/13/2021 9:30 A M Medical Record Number: [id number] Patient Account Number: [id number] Date of Birth/Sex: Treating RN: [birth year] (56 y.o. [name] Primary Care Kelin Borum: [name] Other Clinician: Referring Nawaf Strange: Treating Rahkeem Senft/Extender: [name] Weeks in Treatment: 6 Active Problems Location of Pain Severity and Description of Pain Patient Has Paino No Site Locations Pain Management and Medication Current Pain Management: Electronic Signature(s) Signed: 05/13/2021 4:11:37 PM By: Lorrin Jackson Entered By: Lorrin Jackson on 05/13/2021 09:35:47 -------------------------------------------------------------------------------- Patient/Caregiver Education Details Patient Name: Date of Service: [name] 1/30/2023andnbsp9:30 [place] Record Number: [id number] Patient Account Number: [id number] Date of Birth/Gender: Treating RN: 1965-10-03 (56 y.o. [name] Primary Care Physician: [name] Other Clinician: Referring Physician: Treating Physician/Extender: [name]  Weeks in Treatment: 6 Education Assessment Education Provided To: Patient Education Topics Provided Wound/Skin Impairment: Methods: Demonstration, Explain/Verbal, Printed Responses: State content correctly Electronic Signature(s) Signed: 05/13/2021 4:11:37 PM By: Lorrin Jackson Entered By: Lorrin Jackson on 05/13/2021  09:29:40 -------------------------------------------------------------------------------- Wound Assessment Details Patient Name: Date of Service: [name] 05/13/2021 9:30 A M Medical Record Number: [id number] Patient Account Number: [id number] Date of Birth/Sex: Treating RN: [birth year] (56 y.o. [name] Primary Care Stepehn Eckard: [name] Other Clinician: Referring Givanni Staron: Treating Delaina Fetsch/Extender: [name] Weeks in Treatment: 6 Wound Status Wound Number: 1 Primary Etiology: Open Surgical Wound Wound Location: Midline Back Wound Status: Open Wounding Event: Surgical Injury Comorbid History: Hypertension, Type II Diabetes, Osteomyelitis Date Acquired: 01/11/2021 Weeks Of Treatment: 6 Clustered Wound: No Photos Wound Measurements Length: (cm) 13 Width: (cm) 3.3 Depth: (cm) 2.5 Area: (cm) 33.694 Volume: (cm) 84.234 % Reduction in Area: 55.4% % Reduction in Volume: 51.6% Epithelialization: Small (1-33%) Tunneling: Yes Position (o'clock): 12 Maximum Distance: (cm) 4.2 Undermining: Yes Starting Position (o'clock): 2 Ending Position (o'clock): 4 Maximum Distance: (cm) 1.2 Wound Description Classification: Full Thickness Without Exposed Support Structures Wound Margin: Well defined, not attached Exudate Amount: Medium Exudate Type: Serosanguineous Exudate Color: red, brown Foul Odor After Cleansing: No Slough/Fibrino Yes Wound Bed Granulation Amount: Large (67-100%) Exposed Structure Granulation Quality: Red, Pink Fascia Exposed: No Necrotic Amount: Small (1-33%) Fat Layer (Subcutaneous Tissue) Exposed: Yes Necrotic Quality: Adherent Slough Tendon Exposed: No Muscle Exposed: No Joint Exposed: No Bone Exposed: No Treatment Notes Wound #1 (Back) Wound Laterality: Midline Cleanser Peri-Wound Care Topical Primary Dressing wound VAC Secondary Dressing Secured With Compression Wrap Compression Stockings Add-Ons Electronic  Signature(s) Signed: 05/13/2021 4:11:37 PM By: Lorrin Jackson Entered By: Lorrin Jackson on 05/13/2021 09:44:09 -------------------------------------------------------------------------------- Vitals Details Patient Name: Date of Service: [name] 05/13/2021 9:30 A M Medical Record Number: [id number] Patient Account Number: [id number] Date of Birth/Sex: Treating RN: 1965-04-26 (56 y.o. [name] Primary Care Evolet Salminen: [name] Other Clinician: Referring Theola Cuellar: Treating Winferd Wease/Extender: [name] Weeks in Treatment: 6 Vital Signs Time Taken: 09:35 Temperature (F): 98.5 Height (in): 74 Pulse (bpm): 100 Respiratory Rate (breaths/min): 18 Blood Pressure (mmHg): 90/60 Reference Range: 80 - 120 mg / dl Electronic Signature(s) Signed: 05/13/2021 4:11:37 PM By: Lorrin Jackson Entered By: Lorrin Jackson on 05/13/2021 09:35:41

## 2021-05-16 ENCOUNTER — Encounter: Payer: Self-pay | Admitting: Physical Medicine and Rehabilitation

## 2021-05-16 ENCOUNTER — Other Ambulatory Visit: Payer: Self-pay

## 2021-05-16 ENCOUNTER — Encounter (HOSPITAL_BASED_OUTPATIENT_CLINIC_OR_DEPARTMENT_OTHER): Payer: No Typology Code available for payment source | Attending: Internal Medicine | Admitting: Internal Medicine

## 2021-05-16 DIAGNOSIS — M869 Osteomyelitis, unspecified: Secondary | ICD-10-CM | POA: Insufficient documentation

## 2021-05-16 DIAGNOSIS — E1169 Type 2 diabetes mellitus with other specified complication: Secondary | ICD-10-CM | POA: Diagnosis not present

## 2021-05-16 DIAGNOSIS — G4733 Obstructive sleep apnea (adult) (pediatric): Secondary | ICD-10-CM | POA: Insufficient documentation

## 2021-05-16 DIAGNOSIS — L98424 Non-pressure chronic ulcer of back with necrosis of bone: Secondary | ICD-10-CM | POA: Insufficient documentation

## 2021-05-16 DIAGNOSIS — Z7984 Long term (current) use of oral hypoglycemic drugs: Secondary | ICD-10-CM | POA: Insufficient documentation

## 2021-05-16 DIAGNOSIS — E11622 Type 2 diabetes mellitus with other skin ulcer: Secondary | ICD-10-CM | POA: Insufficient documentation

## 2021-05-16 DIAGNOSIS — E669 Obesity, unspecified: Secondary | ICD-10-CM | POA: Insufficient documentation

## 2021-05-16 MED ORDER — ZINC SULFATE 220 (50 ZN) MG PO TABS: 220.0000 mg | ORAL_TABLET | Freq: Every day | ORAL | 5 refills | Status: AC

## 2021-05-17 NOTE — Progress Notes (Signed)
[name], [name] ([id number]) Visit Report for 05/16/2021 SuperBill Details Patient Name: Date of Service: [name], [name] 05/16/2021 Medical Record Number: [id number] Patient Account Number: [id number] Date of Birth/Sex: Treating RN: [birth year] (56 y.o. 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

## 2021-05-17 NOTE — Progress Notes (Signed)
COSME, JACOB ([id number]) Visit Report for 05/16/2021 Arrival Information Details Patient Name: Date of Service: GORGE, ALMANZA 05/16/2021 9:45 A M Medical Record Number: [id number] Patient Account Number: [id number] Date of Birth/Sex: Treating RN: 12-17-1965 (56 y.o. [name] Primary Care Aaren Krog: [name] Other Clinician: Referring Danicia Terhaar: Treating Wei Newbrough/Extender: [name] in Treatment: 7 Visit Information History Since Last Visit Added or deleted any medications: No Patient Arrived: Wheel Chair Any new allergies or adverse reactions: No Arrival Time: 09:46 Had a fall or experienced change in No Accompanied By: friend activities of daily living that may affect Transfer Assistance: None risk of falls: Patient Identification Verified: Yes Signs or symptoms of abuse/neglect since last visito No Secondary Verification Process Completed: Yes Hospitalized since last visit: No Patient Requires Transmission-Based Precautions: No Implantable device outside of the clinic excluding No Patient Has Alerts: Yes cellular tissue based products placed in the center Patient Alerts: Patient on Blood Thinner since last visit: No BP Right Arm-PICC Has Dressing in Place as Prescribed: Yes Pain Present Now: No Electronic Signature(s) Signed: 05/17/2021 1:32:11 PM By: Lorrin Jackson Entered By: Lorrin Jackson on 05/16/2021 09:46:47 -------------------------------------------------------------------------------- Encounter Discharge Information Details Patient Name: Date of Service: [name] 05/16/2021 9:45 A M Medical Record Number: [id number] Patient Account Number: [id number] Date of Birth/Sex: Treating RN: 12-25-1965 (56 y.o. [name] Primary Care Halden Phegley: [name] Other Clinician: Referring Janita Camberos: Treating Akon Reinoso/Extender: [name] in Treatment: 7 Encounter Discharge Information Items Discharge Condition:  Stable Ambulatory Status: Wheelchair Discharge Destination: Home Transportation: Private Auto Accompanied By: friend Schedule Follow-up Appointment: Yes Clinical Summary of Care: Provided on 05/16/2021 Form Type Recipient Paper Patient Patient Electronic Signature(s) Signed: 05/17/2021 1:32:11 PM By: Lorrin Jackson Entered By: Lorrin Jackson on 05/16/2021 10:33:26 -------------------------------------------------------------------------------- Negative Pressure Wound Therapy Maintenance (NPWT) Details Patient Name: Date of Service: TYLAN, KINN 05/16/2021 9:45 A M Medical Record Number: [id number] Patient Account Number: [id number] Date of Birth/Sex: Treating RN: 01/04/1966 (56 y.o. [name] Primary Care Trygg Mantz: [name] Other Clinician: Referring Diesel Lina: Treating Calliope Delangel/Extender: [name] Weeks in Treatment: 7 NPWT Maintenance Performed for: Wound #1 Midline Back Performed By: Lorrin Jackson, RN Type: VAC System Coverage Size (sq cm): 42.9 Pressure Type: Constant Pressure Setting: 125 mmHG Drain Type: None Sponge/Dressing Type: Combination : white, black Date Initiated: 03/28/2021 Dressing Removed: Yes Canister Changed: Yes Dressing Reapplied: Yes Days On NPWT : 50 Electronic Signature(s) Signed: 05/17/2021 1:32:11 PM By: Lorrin Jackson Entered By: Lorrin Jackson on 05/16/2021 09:49:28 -------------------------------------------------------------------------------- Patient/Caregiver Education Details Patient Name: Date of Service: [name] 2/2/2023andnbsp9:45 A M Medical Record Number: [id number] Patient Account Number: [id number] Date of Birth/Gender: Treating RN: 1965/11/17 (57 y.o. [name] Primary Care Physician: [name] Other Clinician: Referring Physician: Treating Physician/Extender: [name] in Treatment: 7 Education Assessment Education Provided To: Patient Education Topics  Provided Wound/Skin Impairment: Methods: Explain/Verbal, Printed Responses: State content correctly [employer]) Signed: 05/17/2021 1:32:11 PM By: Lorrin Jackson Entered By: Lorrin Jackson on 05/16/2021 10:33:03 -------------------------------------------------------------------------------- Wound Assessment Details Patient Name: Date of Service: [name] 05/16/2021 9:45 A M Medical Record Number: [id number] Patient Account Number: [id number] Date of Birth/Sex: Treating RN: [birth year] (56 y.o. [name] Primary Care Kip Cropp: [name] Other Clinician: Referring Lamesha Tibbits: Treating Kathryn Linarez/Extender: [name] Weeks in Treatment: 7 Wound Status Wound Number: 1 Primary Etiology: Open Surgical Wound Wound Location: Midline Back Wound Status: Open Wounding  Event: Surgical Injury Comorbid History: Hypertension, Type II Diabetes, Osteomyelitis Date Acquired: 01/11/2021 Weeks Of Treatment: 7 Clustered Wound: No Wound Measurements Length: (cm) 13 Width: (cm) 3.3 Depth: (cm) 2.5 Area: (cm) 33.694 Volume: (cm) 84.234 % Reduction in Area: 55.4% % Reduction in Volume: 51.6% Epithelialization: Small (1-33%) Tunneling: Yes Position (o'clock): 12 Maximum Distance: (cm) 4.2 Undermining: Yes Starting Position (o'clock): 2 Ending Position (o'clock): 4 Maximum Distance: (cm) 1.2 Wound Description Classification: Full Thickness Without Exposed Support Structures Wound Margin: Well defined, not attached Exudate Amount: Medium Exudate Type: Serosanguineous Exudate Color: red, brown Foul Odor After Cleansing: No Slough/Fibrino Yes Wound Bed Granulation Amount: Large (67-100%) Exposed Structure Granulation Quality: Red, Pink Fascia Exposed: No Necrotic Amount: Small (1-33%) Fat Layer (Subcutaneous Tissue) Exposed: Yes Necrotic Quality: Adherent Slough Tendon Exposed: No Muscle Exposed: No Joint Exposed: No Bone Exposed: No Treatment  Notes Wound #1 (Back) Wound Laterality: Midline Cleanser Peri-Wound Care Topical Primary Dressing wound VAC Secondary Dressing Secured With Compression Wrap Compression Stockings Add-Ons Electronic Signature(s) Signed: 05/17/2021 1:32:11 PM By: Lorrin Jackson Entered By: Lorrin Jackson on 05/16/2021 09:48:48 -------------------------------------------------------------------------------- Vitals Details Patient Name: Date of Service: [name] 05/16/2021 9:45 A M Medical Record Number: [id number] Patient Account Number: [id number] Date of Birth/Sex: Treating RN: 1965/09/14 (56 y.o. [name] Primary Care Kamirah Shugrue: [name] Other Clinician: Referring Torres Hardenbrook: Treating Gabe Glace/Extender: [name] Weeks in Treatment: 7 Vital Signs Time Taken: 09:50 Temperature (F): 98.2 Height (in): 74 Pulse (bpm): 96 Respiratory Rate (breaths/min): 18 Blood Pressure (mmHg): 100/70 Reference Range: 80 - 120 mg / dl Electronic Signature(s) Signed: 05/17/2021 1:32:11 PM By: Lorrin Jackson Entered By: Lorrin Jackson on 05/16/2021 10:31:33

## 2021-05-18 ENCOUNTER — Encounter: Payer: Self-pay | Admitting: Internal Medicine

## 2021-05-20 ENCOUNTER — Encounter (HOSPITAL_BASED_OUTPATIENT_CLINIC_OR_DEPARTMENT_OTHER): Payer: No Typology Code available for payment source | Admitting: Internal Medicine

## 2021-05-20 ENCOUNTER — Other Ambulatory Visit: Payer: Self-pay

## 2021-05-20 DIAGNOSIS — M869 Osteomyelitis, unspecified: Secondary | ICD-10-CM | POA: Diagnosis not present

## 2021-05-20 DIAGNOSIS — E11622 Type 2 diabetes mellitus with other skin ulcer: Secondary | ICD-10-CM | POA: Diagnosis not present

## 2021-05-20 DIAGNOSIS — L98424 Non-pressure chronic ulcer of back with necrosis of bone: Secondary | ICD-10-CM

## 2021-05-20 NOTE — Progress Notes (Signed)
[name] ([id number]) Visit Report for 05/20/2021 Chief Complaint Document Details Patient Name: Date of Service: [name] 05/20/2021 9:30 A M Medical Record Number: [id number] Patient Account Number: [id number] Date of Birth/Sex: Treating RN: 11-May-1965 (56 y.o. [name] Primary Care Provider: Kathlene Murphy Other Clinician: Referring Provider: Treating Provider/Extender: [name] in Treatment: 7 Information Obtained from: Patient Chief Complaint Back wound Electronic Signature(s) Signed: 05/20/2021 11:21:40 AM By: Christian Shan DO Entered By: [name] on 05/20/2021 11:18:36 -------------------------------------------------------------------------------- HPI Details Patient Name: Date of Service: Christian Grout. 05/20/2021 9:30 A M Medical Record Number: [id number] Patient Account Number: [id number] Date of Birth/Sex: Treating RN: [birth year] (56 y.o. [name] Primary Care Provider: Kathlene Murphy Other Clinician: Referring Provider: Treating Provider/Extender: [name] in Treatment: 7 History of Present Illness HPI Description: Admission 03/28/2021 [name] is a 56 year old male with a past medical history of controlled type 2 diabetes on oral agents, obesity and OSA that presents to the clinic for a back wound. On 01/11/2021 patient had a laminectomy with PLIF of L1-S1 by Dr. Venetia Constable because of lumbar stenosis and radiculopathy. He subsequently developed bacteremia. He had CT imaging on 10/13 of the lumbar spine that showed fluid collection in the soft tissue of the posterior L1 and S1 and was taken to the OR for washout on 10/14. 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 He received 4 Murphy of IV antibiotics by infectious disease. After his completion of 4 Murphy of IV antibiotics he was continued for an additional 4 Murphy of IV cefazolin with a stop date of 12/29. He has  been evaluated by plastic surgery and no plans for surgical intervention at this time. Wife is present and reports he has been on the wound VAC for the past 8 Murphy with improvement in wound healing. He currently denies systemic signs of infection. 12/22; patient presents for follow-up. He reports no issues since last clinic visit. He denies signs of infection. He has been tolerating the wound VAC well. 12/30; patient presents for follow-up. He reports no issues and has no complaints today. He has been tolerating the wound VAC well. 1/9; patient presents for follow-up. He has no issues or complaints today. He states he feels well. He has had no problems with the wound VAC. 1/16; patient presents for follow-up. He continues to use the wound VAC with no issues. He denies signs of infection. 1/23; patient presents for follow-up. He has been switched from IV cefazolin to oral cefadroxil by infectious disease. He has no issues or complaints today. He denies signs of infection. He continues to tolerate the wound VAC well. 1/30; patient presents for follow-up. He continues to tolerate the wound VAC well. 2/6; patient presents for follow-up. He has no issues or complaints today. He continues to tolerate the wound VAC well. He denies signs of infection. Electronic Signature(s) Signed: 05/20/2021 11:21:40 AM By: Christian Shan DO Entered By: [name] on 05/20/2021 11:19:07 -------------------------------------------------------------------------------- Physical Exam Details Patient Name: Date of Service: Christian Brown E. 05/20/2021 9:30 A M Medical Record Number: [id number] Patient Account Number: [id number] Date of Birth/Sex: Treating RN: October 10, 1965 (56 y.o. [name] Primary Care Provider: Kathlene Murphy Other Clinician: Referring Provider: Treating Provider/Extender: [name] in Treatment: 7 Constitutional respirations regular, non-labored and within target range for  patient.Marland Kitchen Psychiatric pleasant and cooperative. Notes Large open wound to the back. Granulation tissue throughout.  There is undermining and tunneling. No probing to bone. No signs of infection. Electronic Signature(s) Signed: 05/20/2021 11:21:40 AM By: Christian Shan DO Entered By: [name] on 05/20/2021 11:19:57 -------------------------------------------------------------------------------- Physician Orders Details Patient Name: Date of Service: Christian Grout. 05/20/2021 9:30 A M Medical Record Number: [id number] Patient Account Number: [id number] Date of Birth/Sex: Treating RN: Mar 21, 1966 (56 y.o. 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 - Monday with [name] Return A Nurse Visit: - Thursday 05/23/21 for vac change Bathing/ Shower/ Hygiene May shower with protection but do not get wound dressing(s) wet. Negative Presssure Wound Therapy Wound Vac to wound continuously at 159mm/hg pressure Black Foam - wound base then bridge to right side White Foam - T tunnel at 12:00 o Additional Orders / Instructions Follow Nutritious Diet - Continue to monitor blood sugars daily Wound Treatment Wound #1 - Back Wound Laterality: Midline Prim Dressing: wound VAC ary 2 x Per Week/30 Days Electronic Signature(s) Signed: 05/20/2021 11:21:40 AM By: Christian Shan DO Entered By: [name] on 05/20/2021 11:20:30 -------------------------------------------------------------------------------- Problem List Details Patient Name: Date of Service: Christian Grout. 05/20/2021 9:30 A  M Medical Record Number: [id number] Patient Account Number: [id number] Date of Birth/Sex: Treating RN: 02-22-1966 (56 y.o. 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 05/20/2021 9:30 A M Medical Record Number: [id number] Patient Account Number: [id number] Date of Birth/Sex: Treating RN: [birth year] (56 y.o. [name] Primary Care Provider: Kathlene Murphy Other Clinician: Referring Provider: Treating Provider/Extender: [name] in Treatment: 7 Subjective Chief Complaint Information obtained from Patient Back wound History of Present Illness (HPI) Admission 03/28/2021 [name] is a 56 year old male with a past medical history of controlled type 2 diabetes on oral agents, obesity and OSA that presents to the clinic for a back wound. On 01/11/2021 patient had a laminectomy with PLIF of L1-S1 by Dr. Venetia Constable because of lumbar stenosis and radiculopathy. He subsequently developed bacteremia. He had CT imaging on 10/13 of the lumbar spine that showed fluid collection in the soft tissue of the posterior L1 and S1 and was taken to the OR for washout on 10/14. 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 He received 4  Murphy of IV antibiotics by infectious disease. After his completion of 4 Murphy of IV antibiotics he was continued for an additional 4 Murphy of IV cefazolin with a stop date of 12/29. He has been evaluated by plastic surgery and no plans for surgical intervention at this time. Wife is present and reports he has been on the wound VAC for the past 8 Murphy with improvement in wound healing. He currently  denies systemic signs of infection. 12/22; patient presents for follow-up. He reports no issues since last clinic visit. He denies signs of infection. He has been tolerating the wound VAC well. 12/30; patient presents for follow-up. He reports no issues and has no complaints today. He has been tolerating the wound VAC well. 1/9; patient presents for follow-up. He has no issues or complaints today. He states he feels well. He has had no problems with the wound VAC. 1/16; patient presents for follow-up. He continues to use the wound VAC with no issues. He denies signs of infection. 1/23; patient presents for follow-up. He has been switched from IV cefazolin to oral cefadroxil by infectious disease. He has no issues or complaints today. He denies signs of infection. He continues to tolerate the wound VAC well. 1/30; patient presents for follow-up. He continues to tolerate the wound VAC well. 2/6; patient presents for follow-up. He has no issues or complaints today. He continues to tolerate the wound VAC well. He denies signs of infection. Patient History Information obtained from Patient. Family History Diabetes - Mother, Stroke - Siblings, No family history of Cancer, Heart Disease, Hereditary Spherocytosis, Hypertension, Kidney Disease, Lung Disease, Seizures, Thyroid Problems, Tuberculosis. Social History Never smoker, Marital Status - Married, Alcohol Use - Rarely, Drug Use - No History, Caffeine Use - Rarely. Medical History Cardiovascular Patient has history of Hypertension Endocrine Patient  has history of Type II Diabetes Musculoskeletal Patient has history of Osteomyelitis Medical A Surgical History Notes nd Musculoskeletal DDD Objective Constitutional respirations regular, non-labored and within target range for patient.. Vitals Time Taken: 9:44 AM, Height: 74 in, Temperature: 98.9 F, Pulse: 115 bpm, Respiratory Rate: 18 breaths/min, Blood Pressure: 96/59 mmHg. Psychiatric pleasant and cooperative. General Notes: Large open wound to the back. Granulation tissue throughout. There is undermining and tunneling. No probing to bone. No signs of infection. Integumentary (Hair, Skin) Wound #1 status is Open. Original cause of wound was Surgical Injury. The date acquired was: 01/11/2021. The wound has been in treatment 7 Murphy. The wound is located on the Midline Back. The wound measures 12.7cm length x 4cm width x 2.2cm depth; 39.898cm^2 area and 87.776cm^3 volume. There is Fat Layer (Subcutaneous Tissue) exposed. Tunneling has been noted at 12:00 with a maximum distance of 4.7cm. Undermining begins at 3:00 and ends at 5:00 with a maximum distance of 1.5cm. There is a medium amount of serosanguineous drainage noted. The wound margin is well defined and not attached to the wound base. There is large (67-100%) red, pink granulation within the wound bed. There is a small (1-33%) amount of necrotic tissue within the wound bed including Adherent Slough. 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 No signs of infection on exam. No concerning features. Follow-up in 1 week Plan Follow-up Appointments: Return Appointment in 1 week. - Monday with Dr. Heber [place] Nurse Visit: - Thursday 05/23/21 for vac change Bathing/ Shower/ Hygiene: May shower with protection but do not get wound dressing(s) wet. Negative Presssure  Wound Therapy: Wound Vac to wound continuously at 165mm/hg pressure Black Foam - wound base then bridge to right side White Foam - T tunnel at 12:00 o Additional Orders / Instructions: Follow Nutritious Diet - Continue to monitor blood sugars daily WOUND #1: - Back Wound Laterality: Midline Prim Dressing: wound VAC 2 x Per Week/30 Days ary 1. Continue wound VAC  Electronic Signature(s) Signed: 05/20/2021 11:21:40 AM By: Christian Shan DO Entered By: [name] on 05/20/2021 11:21:07 -------------------------------------------------------------------------------- HxROS Details Patient Name: Date of Service: Christian Brown E. 05/20/2021 9:30 A M Medical Record Number: [id number] Patient Account Number: [id number] Date of Birth/Sex: Treating RN: Jun 19, 1965 (57 y.o. [name] Primary Care Provider: Kathlene Murphy Other Clinician: Referring Provider: Treating Provider/Extender: [name] in Treatment: 7 Information Obtained From Patient Cardiovascular Medical History: Positive for: Hypertension Endocrine Medical History: Positive for: Type II Diabetes Time with diabetes: Since mid 90's Treated with: Oral agents Blood sugar tested every day: Yes Tested : 2x day Musculoskeletal Medical History: Positive for: Osteomyelitis Past Medical History Notes: DDD Immunizations Pneumococcal Vaccine: Received Pneumococcal Vaccination: Yes Received Pneumococcal Vaccination On or After 60th Birthday: No Implantable Devices Yes Family and Social History Cancer: No; Diabetes: Yes - Mother; Heart Disease: No; Hereditary Spherocytosis: No; Hypertension: No; Kidney Disease: No; Lung Disease: No; Seizures: No; Stroke: Yes - Siblings; Thyroid Problems: No; Tuberculosis: No; Never smoker; Marital Status - Married; Alcohol Use: Rarely; Drug Use: No History; Caffeine Use: Rarely; Financial Concerns: No; Food, Clothing or Shelter Needs: No; Support System Lacking: No;  Transportation Concerns: No Electronic Signature(s) Signed: 05/20/2021 11:21:40 AM By: Christian Shan DO Signed: 05/20/2021 4:43:35 PM By: Lorrin Jackson Entered By: [name] on 05/20/2021 11:19:14 -------------------------------------------------------------------------------- SuperBill Details Patient Name: Date of Service: Christian Grout 05/20/2021 Medical Record Number: [id number] Patient Account Number: [id number] Date of Birth/Sex: Treating RN: 05/12/1965 (56 y.o. 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 M86.9 Osteomyelitis, unspecified E11.622 Type 2 diabetes mellitus with other skin ulcer Facility Procedures CPT4 Code: [id number] Description: 84696 - WOUND VAC-GREATER TH 50 SQ CM Modifier: Quantity: 1 Physician Procedures : CPT4 Code Description Modifier [id number] - WC PHYS LEVEL 3 - EST PT ICD-10 Diagnosis Description L98.424 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

## 2021-05-21 NOTE — Progress Notes (Signed)
[name], [name] ([id number]) Visit Report for 05/20/2021 Arrival Information Details Patient Name: Date of Service: [name], [name] 05/20/2021 9:30 A M Medical Record Number: [id number] Patient Account Number: [id number] Date of Birth/Sex: Treating RN: Aug 17, 1965 (56 y.o. [name] Primary Care Glendoris Nodarse: [name] Other Clinician: Referring Karnisha Lefebre: Treating Kassem Kibbe/Extender: [name] in Treatment: 7 Visit Information History Since Last Visit Added or deleted any medications: No Patient Arrived: Wheel Chair Any new allergies or adverse reactions: No Arrival Time: 09:43 Had a fall or experienced change in No Accompanied By: brother activities of daily living that may affect Transfer Assistance: None risk of falls: Patient Identification Verified: Yes Signs or symptoms of abuse/neglect since last visito No Secondary Verification Process Completed: Yes Hospitalized since last visit: No Patient Requires Transmission-Based Precautions: No Implantable device outside of the clinic excluding No Patient Has Alerts: Yes cellular tissue based products placed in the center Patient Alerts: Patient on Blood Thinner since last visit: No BP Right Arm-PICC Has Dressing in Place as Prescribed: Yes Pain Present Now: Yes Electronic Signature(s) Signed: 05/21/2021 8:57:49 AM By: Sandre Kitty Entered By: Sandre Kitty on 05/20/2021 09:44:03 -------------------------------------------------------------------------------- Encounter Discharge Information Details Patient Name: Date of Service: Christian Grout. 05/20/2021 9:30 A M Medical Record Number: [id number] Patient Account Number: [id number] Date of Birth/Sex: Treating RN: Nov 14, 1965 (56 y.o. [name] Primary Care Shaylah Mcghie: [name] Other Clinician: Referring Sony Schlarb: Treating Azharia Surratt/Extender: [name] Weeks in Treatment: 7 Encounter Discharge Information Items Discharge  Condition: Stable Ambulatory Status: Wheelchair Discharge Destination: Home Transportation: Private Auto Accompanied By: son Schedule Follow-up Appointment: Yes Clinical Summary of Care: Patient Declined Electronic Signature(s) Signed: 05/20/2021 2:05:18 PM By: Levan Hurst RN, BSN Entered By: Levan Hurst on 05/20/2021 12:47:14 -------------------------------------------------------------------------------- Lower Extremity Assessment Details Patient Name: Date of Service: Christian Brown E. 05/20/2021 9:30 A M Medical Record Number: [id number] Patient Account Number: [id number] Date of Birth/Sex: Treating RN: 1965-10-30 (56 y.o. [name] Primary Care Tucker Minter: [name] Other Clinician: Referring Janazia Schreier: Treating Julianne Chamberlin/Extender: [name] Weeks in Treatment: 7 Electronic Signature(s) Signed: 05/20/2021 4:47:34 PM By: Deon Pilling RN, BSN Entered By: Deon Pilling on 05/20/2021 10:02:28 -------------------------------------------------------------------------------- Multi Wound Chart Details Patient Name: Date of Service: Christian Brown E. 05/20/2021 9:30 A M Medical Record Number: [id number] Patient Account Number: [id number] Date of Birth/Sex: Treating RN: 11/13/1965 (56 y.o. [name] Primary Care Jakeline Dave: [name] Other Clinician: Referring Jadie Comas: Treating Pryor Guettler/Extender: [name] Weeks in Treatment: 7 Vital Signs Height(in): 74 Pulse(bpm): 115 Weight(lbs): Blood Pressure(mmHg): 96/59 Body Mass Index(BMI): Temperature(F): 98.9 Respiratory Rate(breaths/min): 18 Photos: [N/A:N/A] Midline Back N/A N/A Wound Location: Surgical Injury N/A N/A Wounding Event: Open Surgical Wound N/A N/A Primary Etiology: Hypertension, Type II Diabetes, N/A N/A Comorbid History: Osteomyelitis 01/11/2021 N/A N/A Date Acquired: 7 N/A N/A Weeks of Treatment: Open N/A N/A Wound Status: No N/A N/A Wound  Recurrence: 12.7x4x2.2 N/A N/A Measurements L x W x D (cm) 39.898 N/A N/A A (cm) : rea 87.776 N/A N/A Volume (cm) : 47.20% N/A N/A % Reduction in A rea: 49.50% N/A N/A % Reduction in Volume: 12 Position 1 (o'clock): 4.7 Maximum Distance 1 (cm): 3 Starting Position 1 (o'clock): 5 Ending Position 1 (o'clock): 1.5 Maximum Distance 1 (cm): Yes N/A N/A Tunneling: Yes N/A N/A Undermining: Full Thickness Without Exposed N/A N/A Classification: Support Structures Medium N/A N/A Exudate Amount: Serosanguineous N/A N/A Exudate Type: red, brown N/A N/A Exudate Color: Well  defined, not attached N/A N/A Wound Margin: Large (67-100%) N/A N/A Granulation Amount: Red, Pink N/A N/A Granulation Quality: Small (1-33%) N/A N/A Necrotic Amount: Fat Layer (Subcutaneous Tissue): Yes N/A N/A Exposed Structures: Fascia: No Tendon: No Muscle: No Joint: No Bone: No Small (1-33%) N/A N/A Epithelialization: Negative Pressure Wound Therapy N/A N/A Procedures Performed: Maintenance (NPWT) Treatment Notes Electronic Signature(s) Signed: 05/20/2021 11:21:40 AM By: Kalman Shan DO Signed: 05/20/2021 4:43:35 PM By: Lorrin Jackson Entered By: Kalman Shan on 05/20/2021 11:18:26 -------------------------------------------------------------------------------- Multi-Disciplinary Care Plan Details Patient Name: Date of Service: Christian Grout. 05/20/2021 9:30 A M Medical Record Number: [id number] Patient Account Number: [id number] Date of Birth/Sex: Treating RN: 04-30-1965 (55 y.o. [name] Primary Care Kristin Lamagna: [name] Other Clinician: Referring Margaurite Salido: Treating Divonte Senger/Extender: [name] in Treatment: 7 Ralls reviewed with physician Active Inactive Osteomyelitis Nursing Diagnoses: Infection: osteomyelitis Goals: Patient's osteomyelitis will resolve Date Initiated: 03/28/2021 Target Resolution Date:  05/31/2021 Goal Status: Active Interventions: Assess for signs and symptoms of osteomyelitis resolution every visit Treatment Activities: MRI : 04/02/2021 Systemic antibiotics : 03/28/2021 Notes: Wound/Skin Impairment Nursing Diagnoses: Impaired tissue integrity Goals: Patient/caregiver will verbalize understanding of skin care regimen Date Initiated: 03/28/2021 Target Resolution Date: 05/31/2021 Goal Status: Active Ulcer/skin breakdown will have a volume reduction of 30% by week 4 Date Initiated: 03/28/2021 Target Resolution Date: 05/31/2021 Goal Status: Active Interventions: Assess patient/caregiver ability to obtain necessary supplies Assess patient/caregiver ability to perform ulcer/skin care regimen upon admission and as needed Assess ulceration(s) every visit Provide education on ulcer and skin care Treatment Activities: Topical wound management initiated : 03/28/2021 Notes: Electronic Signature(s) Signed: 05/20/2021 2:05:18 PM By: Levan Hurst RN, BSN Entered By: Levan Hurst on 05/20/2021 10:46:13 -------------------------------------------------------------------------------- Negative Pressure Wound Therapy Maintenance (NPWT) Details Patient Name: Date of Service: [name], [name] 05/20/2021 9:30 A M Medical Record Number: [id number] Patient Account Number: [id number] Date of Birth/Sex: Treating RN: 04/03/1966 (56 y.o. [name] Primary Care Cedric Denison: [name] Other Clinician: Referring Braylei Totino: Treating Theodosia Bahena/Extender: [name] Weeks in Treatment: 7 NPWT Maintenance Performed for: Wound #1 Midline Back Performed By: Levan Hurst, RN Type: VAC System Coverage Size (sq cm): 50.8 Pressure Type: Constant Pressure Setting: 125 mmHG Drain Type: None Sponge/Dressing Type: Foam, Black Date Initiated: 03/28/2021 Dressing Removed: No Canister Changed: No Dressing Reapplied: Yes Quantity of Sponges/Gauze Inserted: 2 pieces white  foam, 2 pieces black foam Respones T Treatment: o pt tolerated well Days On NPWT : 54 Post Procedure Diagnosis Same as Pre-procedure Electronic Signature(s) Signed: 05/20/2021 2:05:18 PM By: Levan Hurst RN, BSN Entered By: Levan Hurst on 05/20/2021 10:17:43 -------------------------------------------------------------------------------- Pain Assessment Details Patient Name: Date of Service: Christian Grout. 05/20/2021 9:30 A M Medical Record Number: [id number] Patient Account Number: [id number] Date of Birth/Sex: Treating RN: Jul 05, 1965 (56 y.o. [name] Primary Care Duard Spiewak: [name] Other Clinician: Referring Chanetta Moosman: Treating Johneric Mcfadden/Extender: [name] Weeks in Treatment: 7 Active Problems Location of Pain Severity and Description of Pain Patient Has Paino Yes Site Locations Rate the pain. Current Pain Level: 7 Pain Management and Medication Current Pain Management: Electronic Signature(s) Signed: 05/20/2021 4:43:35 PM By: Lorrin Jackson Signed: 05/21/2021 8:57:49 AM By: Sandre Kitty Entered By: Sandre Kitty on 05/20/2021 09:44:31 -------------------------------------------------------------------------------- Patient/Caregiver Education Details Patient Name: Date of Service: Christian Grout 2/6/2023andnbsp9:30 [place] Record Number: [id number] Patient Account Number: [id number] Date of Birth/Gender: Treating RN: 11-11-1965 (56 y.o. [name] Primary Care Physician: [name],  Jose Other Clinician: Referring Physician: Treating Physician/Extender: [name] in Treatment: 7 Education Assessment Education Provided To: Patient Education Topics Provided Wound/Skin Impairment: Methods: Explain/Verbal Responses: State content correctly Electronic Signature(s) Signed: 05/20/2021 2:05:18 PM By: Levan Hurst RN, BSN Entered By: Levan Hurst on 05/20/2021  10:46:29 -------------------------------------------------------------------------------- Wound Assessment Details Patient Name: Date of Service: Christian Grout. 05/20/2021 9:30 A M Medical Record Number: [id number] Patient Account Number: [id number] Date of Birth/Sex: Treating RN: 1965/07/15 (56 y.o. [name] Primary Care Santiaga Butzin: [name] Other Clinician: Referring Mililani Murthy: Treating Makaelyn Aponte/Extender: [name] Weeks in Treatment: 7 Wound Status Wound Number: 1 Primary Etiology: Open Surgical Wound Wound Location: Midline Back Wound Status: Open Wounding Event: Surgical Injury Comorbid History: Hypertension, Type II Diabetes, Osteomyelitis Date Acquired: 01/11/2021 Weeks Of Treatment: 7 Clustered Wound: No Photos Wound Measurements Length: (cm) 12.7 Width: (cm) 4 Depth: (cm) 2.2 Area: (cm) 39.898 Volume: (cm) 87.776 % Reduction in Area: 47.2% % Reduction in Volume: 49.5% Epithelialization: Small (1-33%) Tunneling: Yes Position (o'clock): 12 Maximum Distance: (cm) 4.7 Undermining: Yes Starting Position (o'clock): 3 Ending Position (o'clock): 5 Maximum Distance: (cm) 1.5 Wound Description Classification: Full Thickness Without Exposed Support Structu Wound Margin: Well defined, not attached Exudate Amount: Medium Exudate Type: Serosanguineous Exudate Color: red, brown res Foul Odor After Cleansing: No Slough/Fibrino Yes Wound Bed Granulation Amount: Large (67-100%) Exposed Structure Granulation Quality: Red, Pink Fascia Exposed: No Necrotic Amount: Small (1-33%) Fat Layer (Subcutaneous Tissue) Exposed: Yes Necrotic Quality: Adherent Slough Tendon Exposed: No Muscle Exposed: No Joint Exposed: No Bone Exposed: No Treatment Notes Wound #1 (Back) Wound Laterality: Midline Cleanser Peri-Wound Care Topical Primary Dressing wound VAC Secondary Dressing Secured With Compression Wrap Compression Stockings Add-Ons Electronic  Signature(s) Signed: 05/20/2021 4:43:35 PM By: Lorrin Jackson Signed: 05/20/2021 4:47:34 PM By: Deon Pilling RN, BSN Entered By: Deon Pilling on 05/20/2021 10:02:55 -------------------------------------------------------------------------------- Vitals Details Patient Name: Date of Service: Christian Brown E. 05/20/2021 9:30 A M Medical Record Number: [id number] Patient Account Number: [id number] Date of Birth/Sex: Treating RN: Sep 03, 1965 (56 y.o. [name] Primary Care Yago Ludvigsen: [name] Other Clinician: Referring Ophia Shamoon: Treating Danni Shima/Extender: [name] Weeks in Treatment: 7 Vital Signs Time Taken: 09:44 Temperature (F): 98.9 Height (in): 74 Pulse (bpm): 115 Respiratory Rate (breaths/min): 18 Blood Pressure (mmHg): 96/59 Reference Range: 80 - 120 mg / dl Electronic Signature(s) Signed: 05/21/2021 8:57:49 AM By: Sandre Kitty Entered By: Sandre Kitty on 05/20/2021 09:44:20

## 2021-05-23 ENCOUNTER — Other Ambulatory Visit: Payer: Self-pay

## 2021-05-23 ENCOUNTER — Encounter (HOSPITAL_BASED_OUTPATIENT_CLINIC_OR_DEPARTMENT_OTHER): Payer: No Typology Code available for payment source | Admitting: Internal Medicine

## 2021-05-23 DIAGNOSIS — E11622 Type 2 diabetes mellitus with other skin ulcer: Secondary | ICD-10-CM | POA: Diagnosis not present

## 2021-05-23 NOTE — Progress Notes (Signed)
DINESH, ULYSSE ([id number]) Visit Report for 05/23/2021 Arrival Information Details Patient Name: Date of Service: CAMPBELL, KRAY 05/23/2021 9:45 A M Medical Record Number: [id number] Patient Account Number: [id number] Date of Birth/Sex: Treating RN: 1965-09-15 (56 y.o. [name] Primary Care Prospero Mahnke: [name] Other Clinician: Referring Jahnya Trindade: Treating Deboraha Goar/Extender: [name] in Treatment: 8 Visit Information History Since Last Visit Added or deleted any medications: No Patient Arrived: Wheel Chair Any new allergies or adverse reactions: No Arrival Time: 09:56 Had a fall or experienced change in No Transfer Assistance: Manual activities of daily living that may affect Patient Identification Verified: Yes risk of falls: Secondary Verification Process Completed: Yes Signs or symptoms of abuse/neglect since last visito No Patient Requires Transmission-Based Precautions: No Hospitalized since last visit: No Patient Has Alerts: Yes Has Dressing in Place as Prescribed: Yes Patient Alerts: Patient on Blood Thinner Pain Present Now: Yes No BP Right Arm-PICC Electronic Signature(s) Signed: 05/23/2021 4:48:24 PM By: Sharyn Creamer RN, BSN Entered By: Sharyn Creamer on 05/23/2021 09:57:05 -------------------------------------------------------------------------------- Encounter Discharge Information Details Patient Name: Date of Service: [name] 05/23/2021 9:45 A M Medical Record Number: [id number] Patient Account Number: [id number] Date of Birth/Sex: Treating RN: 12/31/1965 (56 y.o. [name] Primary Care Ottie Tillery: [name] Other Clinician: Referring Ichiro Chesnut: Treating Jordon Kristiansen/Extender: [name] in Treatment: 8 Encounter Discharge Information Items Discharge Condition: Stable Ambulatory Status: Wheelchair Discharge Destination: Home Transportation: Private Auto Accompanied By: son Schedule Follow-up  Appointment: Yes Clinical Summary of Care: Patient Declined Electronic Signature(s) Signed: 05/23/2021 4:48:24 PM By: Sharyn Creamer RN, BSN Entered By: Sharyn Creamer on 05/23/2021 16:43:45 -------------------------------------------------------------------------------- Negative Pressure Wound Therapy Maintenance (NPWT) Details Patient Name: Date of Service: BROLIN, DAMBROSIA 05/23/2021 9:45 A M Medical Record Number: [id number] Patient Account Number: [id number] Date of Birth/Sex: Treating RN: 1965/11/15 (56 y.o. [name] Primary Care Daichi Moris: [name] Other Clinician: Referring Roshawn Lacina: Treating Kharizma Lesnick/Extender: [name] Weeks in Treatment: 8 NPWT Maintenance Performed for: Wound #1 Midline Back Performed By: Sharyn Creamer, RN Type: VAC System Coverage Size (sq cm): 50.8 Pressure Type: Constant Pressure Setting: 125 mmHG Drain Type: None Primary Contact: Non-Adherent Sponge/Dressing Type: Combination : 1 white foam, 2 black foam Date Initiated: 03/28/2021 Dressing Removed: Yes Quantity of Sponges/Gauze Removed: 4 total , 2 white foam, 2 black foam Canister Changed: Yes Canister Exudate Volume: 200 Dressing Reapplied: Yes Quantity of Sponges/Gauze Inserted: 1 white foam, 2 black foam Respones T Treatment: o tolerated well Days On NPWT : 57 Electronic Signature(s) Signed: 05/23/2021 4:48:24 PM By: Sharyn Creamer RN, BSN Entered By: Sharyn Creamer on 05/23/2021 16:42:20 -------------------------------------------------------------------------------- Wound Assessment Details Patient Name: Date of Service: [name] 05/23/2021 9:45 A M Medical Record Number: [id number] Patient Account Number: [id number] Date of Birth/Sex: Treating RN: 30-Dec-1965 (56 y.o. [name] Primary Care Lenay Lovejoy: [name] Other Clinician: Referring Tramar Brueckner: Treating Quinley Nesler/Extender: [name] Weeks in Treatment: 8 Wound Status Wound  Number: 1 Primary Etiology: Open Surgical Wound Wound Location: Midline Back Wound Status: Open Wounding Event: Surgical Injury Date Acquired: 01/11/2021 Weeks Of Treatment: 8 Clustered Wound: No Wound Measurements Length: (cm) 12.7 Width: (cm) 4 Depth: (cm) 2.2 Area: (cm) 39.898 Volume: (cm) 87.776 % Reduction in Area: 47.2% % Reduction in Volume: 49.5% Wound Description Classification: Full Thickness Without Exposed Support Structu Exudate Amount: Medium Exudate Type: Serosanguineous Exudate Color: red, brown res Treatment Notes Wound #1 (Back) Wound Laterality: Midline Cleanser Peri-Wound Care  Topical Primary Dressing wound VAC Secondary Dressing Secured With Compression Wrap Compression Stockings Add-Ons Electronic Signature(s) Signed: 05/23/2021 4:48:24 PM By: Sharyn Creamer RN, BSN Entered By: Sharyn Creamer on 05/23/2021 09:59:16 -------------------------------------------------------------------------------- [place] Details Patient Name: Date of Service: [name] 05/23/2021 9:45 A M Medical Record Number: [id number] Patient Account Number: [id number] Date of Birth/Sex: Treating RN: [birth year] (56 y.o. [name] Primary Care Shine Scrogham: [name] Other Clinician: Referring Jarquavious Fentress: Treating Gennesis Hogland/Extender: [name] Weeks in Treatment: 8 Vital Signs Time Taken: 09:57 Temperature (F): 97.9 Height (in): 74 Pulse (bpm): 102 Respiratory Rate (breaths/min): 18 Blood Pressure (mmHg): 128/80 Reference Range: 80 - 120 mg / dl Electronic Signature(s) Signed: 05/23/2021 4:48:24 PM By: Sharyn Creamer RN, BSN Entered By: Sharyn Creamer on 05/23/2021 09:58:59

## 2021-05-23 NOTE — Progress Notes (Signed)
[name], [name] ([id number]) Visit Report for 05/23/2021 SuperBill Details Patient Name: Date of Service: [name], [name] 05/23/2021 Medical Record Number: [id number] Patient Account Number: [id number] Date of Birth/Sex: Treating RN: 1965/06/08 (56 y.o. 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

## 2021-05-27 ENCOUNTER — Encounter (HOSPITAL_BASED_OUTPATIENT_CLINIC_OR_DEPARTMENT_OTHER): Payer: No Typology Code available for payment source | Admitting: Internal Medicine

## 2021-05-27 ENCOUNTER — Other Ambulatory Visit: Payer: Self-pay

## 2021-05-27 DIAGNOSIS — M869 Osteomyelitis, unspecified: Secondary | ICD-10-CM

## 2021-05-27 DIAGNOSIS — L98424 Non-pressure chronic ulcer of back with necrosis of bone: Secondary | ICD-10-CM | POA: Diagnosis not present

## 2021-05-27 DIAGNOSIS — E11622 Type 2 diabetes mellitus with other skin ulcer: Secondary | ICD-10-CM

## 2021-05-27 NOTE — Progress Notes (Signed)
ZACHREY, DEUTSCHER ([id number]) Visit Report for 05/27/2021 Chief Complaint Document Details Patient Name: Date of Service: CONSTANTINO, STARACE 05/27/2021 9:45 A M Medical Record Number: [id number] Patient Account Number: [id number] Date of Birth/Sex: Treating RN: May 02, 1965 (56 y.o. [name] Primary Care Provider: [name] Other Clinician: Referring Provider: Treating Provider/Extender: [name] Weeks in Treatment: 8 Information Obtained from: Patient Chief Complaint Back wound Electronic Signature(s) Signed: 05/27/2021 10:28:01 AM By: Kalman Shan DO Entered By: Kalman Shan on 05/27/2021 10:24:26 -------------------------------------------------------------------------------- HPI Details Patient Name: Date of Service: [name] 05/27/2021 9:45 A M Medical Record Number: [id number] Patient Account Number: [id number] Date of Birth/Sex: Treating RN: [birth year] (56 y.o. [name] Primary Care Provider: [name] Other Clinician: Referring Provider: Treating Provider/Extender: [name] Weeks in Treatment: 8 History of Present Illness HPI Description: Admission 03/28/2021 Mr. Wilmont Olund is a 56 year old male with a past medical history of controlled type 2 diabetes on oral agents, obesity and OSA that presents to the clinic for a back wound. On 01/11/2021 patient had a laminectomy with PLIF of L1-S1 by Dr. Venetia Constable because of lumbar stenosis and radiculopathy. He subsequently developed bacteremia. He had CT imaging on 10/13 of the lumbar spine that showed fluid collection in the soft tissue of the posterior L1 and S1 and was taken to the OR for washout on 10/14. 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 He received 4 weeks of IV antibiotics by infectious disease. After his completion of 4 weeks of IV antibiotics he was continued for an additional 4 weeks of IV cefazolin with a stop date of 12/29. He has  been evaluated by plastic surgery and no plans for surgical intervention at this time. Wife is present and reports he has been on the wound VAC for the past 8 weeks with improvement in wound healing. He currently denies systemic signs of infection. 12/22; patient presents for follow-up. He reports no issues since last clinic visit. He denies signs of infection. He has been tolerating the wound VAC well. 12/30; patient presents for follow-up. He reports no issues and has no complaints today. He has been tolerating the wound VAC well. 1/9; patient presents for follow-up. He has no issues or complaints today. He states he feels well. He has had no problems with the wound VAC. 1/16; patient presents for follow-up. He continues to use the wound VAC with no issues. He denies signs of infection. 1/23; patient presents for follow-up. He has been switched from IV cefazolin to oral cefadroxil by infectious disease. He has no issues or complaints today. He denies signs of infection. He continues to tolerate the wound VAC well. 1/30; patient presents for follow-up. He continues to tolerate the wound VAC well. 2/6; patient presents for follow-up. He has no issues or complaints today. He continues to tolerate the wound VAC well. He denies signs of infection. 2/13; patient presents for follow-up. He continues to do well with the wound VAC. He denies any issues. Electronic Signature(s) Signed: 05/27/2021 10:28:01 AM By: Kalman Shan DO Entered By: Kalman Shan on 05/27/2021 10:25:02 -------------------------------------------------------------------------------- Physical Exam Details Patient Name: Date of Service: [name] 05/27/2021 9:45 A M Medical Record Number: [id number] Patient Account Number: [id number] Date of Birth/Sex: Treating RN: [birth year] (56 y.o. [name] Primary Care Provider: [name] Other Clinician: Referring Provider: Treating Provider/Extender: [name] Weeks in Treatment: 8 Constitutional respirations regular, non-labored and within  target range for patient.Marland Kitchen Psychiatric pleasant and cooperative. Notes Large open wound to the back. Granulation tissue throughout. There is undermining and tunneling. No probing to bone. No signs of infection. Electronic Signature(s) Signed: 05/27/2021 10:28:01 AM By: Kalman Shan DO Entered By: Kalman Shan on 05/27/2021 10:25:57 -------------------------------------------------------------------------------- Physician Orders Details Patient Name: Date of Service: [name] 05/27/2021 9:45 A M Medical Record Number: [id number] Patient Account Number: [id number] Date of Birth/Sex: Treating RN: 1965/06/12 (56 y.o. 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 - Dr. Heber [place] Monday 2/27 Return A Nurse Visit: - Thursday 2/16, Monday 2/20, Thurs 2/23 Bathing/ Shower/ Hygiene May shower with protection but do not get wound dressing(s) wet. Negative Presssure Wound Therapy Wound Vac to wound continuously at 129mm/hg pressure Black Foam - wound base then bridge to right side White Foam - T tunnel at 12:00 o Additional Orders / Instructions Follow Nutritious Diet - Continue to monitor blood sugars daily Wound Treatment Wound #1 - Back Wound Laterality: Midline Prim Dressing: wound VAC ary 2 x Per Week/30 Days Electronic Signature(s) Signed: 05/27/2021 10:28:01 AM By: Kalman Shan DO Entered By: Kalman Shan on 05/27/2021  10:26:13 -------------------------------------------------------------------------------- Problem List Details Patient Name: Date of Service: [name] 05/27/2021 9:45 A M Medical Record Number: [id number] Patient Account Number: [id number] Date of Birth/Sex: Treating RN: [birth year] (56 y.o. 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 05/27/2021 9:45 A M Medical Record Number: [id number] Patient Account Number: [id number] Date of Birth/Sex: Treating RN: 28-Mar-1966 (56 y.o. [name] Primary Care Provider: [name] Other Clinician: Referring Provider: Treating Provider/Extender: [name] Weeks in Treatment: 8 Subjective Chief Complaint Information obtained from Patient Back wound History of Present Illness (HPI) Admission 03/28/2021 Mr. Jonny Dearden is a 56 year old male with a past medical history of controlled type 2 diabetes on oral agents, obesity and OSA that presents to the clinic for a back wound. On 01/11/2021 patient had a laminectomy with PLIF of L1-S1 by Dr. Venetia Constable because of lumbar stenosis and radiculopathy. He subsequently developed bacteremia. He had CT imaging on 10/13 of the lumbar spine that showed fluid collection  in the soft tissue of the posterior L1 and S1 and was taken to the OR for washout on 10/14. 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 He received 4 weeks of IV antibiotics by infectious disease. After his completion of 4 weeks of IV antibiotics he was continued for an additional 4 weeks of IV cefazolin with a stop date of 12/29. He has been evaluated by plastic surgery and no plans for surgical intervention at this time. Wife is present and reports  he has been on the wound VAC for the past 8 weeks with improvement in wound healing. He currently denies systemic signs of infection. 12/22; patient presents for follow-up. He reports no issues since last clinic visit. He denies signs of infection. He has been tolerating the wound VAC well. 12/30; patient presents for follow-up. He reports no issues and has no complaints today. He has been tolerating the wound VAC well. 1/9; patient presents for follow-up. He has no issues or complaints today. He states he feels well. He has had no problems with the wound VAC. 1/16; patient presents for follow-up. He continues to use the wound VAC with no issues. He denies signs of infection. 1/23; patient presents for follow-up. He has been switched from IV cefazolin to oral cefadroxil by infectious disease. He has no issues or complaints today. He denies signs of infection. He continues to tolerate the wound VAC well. 1/30; patient presents for follow-up. He continues to tolerate the wound VAC well. 2/6; patient presents for follow-up. He has no issues or complaints today. He continues to tolerate the wound VAC well. He denies signs of infection. 2/13; patient presents for follow-up. He continues to do well with the wound VAC. He denies any issues. Patient History Information obtained from Patient. Family History Diabetes - Mother, Stroke - Siblings, No family history of Cancer, Heart Disease, Hereditary Spherocytosis, Hypertension, Kidney  Disease, Lung Disease, Seizures, Thyroid Problems, Tuberculosis. Social History Never smoker, Marital Status - Married, Alcohol Use - Rarely, Drug Use - No History, Caffeine Use - Rarely. Medical History Cardiovascular Patient has history of Hypertension Endocrine Patient has history of Type II Diabetes Musculoskeletal Patient has history of Osteomyelitis Medical A Surgical History Notes nd Musculoskeletal DDD Objective Constitutional respirations regular, non-labored and within target range for patient.. Vitals Time Taken: 9:43 AM, Height: 74 in, Temperature: 97.8 F, Pulse: 109 bpm, Respiratory Rate: 18 breaths/min, Blood Pressure: 124/81 mmHg. Psychiatric pleasant and cooperative. General Notes: Large open wound to the back. Granulation tissue throughout. There is undermining and tunneling. No probing to bone. No signs of infection. Integumentary (Hair, Skin) Wound #1 status is Open. Original cause of wound was Surgical Injury. The date acquired was: 01/11/2021. The wound has been in treatment 8 weeks. The wound is located on the Midline Back. The wound measures 12cm length x 3.5cm width x 2.2cm depth; 32.987cm^2 area and 72.571cm^3 volume. There is Fat Layer (Subcutaneous Tissue) exposed. There is no undermining noted, however, there is tunneling at 12:00 with a maximum distance of 3.9cm. There is a medium amount of serosanguineous drainage noted. The wound margin is flat and intact. There is large (67-100%) red, pink granulation within the wound bed. There is a small (1-33%) amount of necrotic tissue within the wound bed including Adherent Slough. 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 No signs of infection. I recommended continuing the wound VAC. Follow-up in 2  weeks Plan Follow-up Appointments: Return Appointment in 2 weeks. - Dr. Heber [place] Monday 2/27 Nurse Visit: - Thursday 2/16, Monday 2/20, Thurs 2/23 Bathing/ Shower/ Hygiene: May shower with protection but do not get wound dressing(s) wet. Negative Presssure Wound Therapy: Wound Vac to wound continuously at 155mm/hg pressure Black Foam - wound base then bridge to right side White Foam - T tunnel at 12:00 o Additional Orders / Instructions: Follow Nutritious Diet -  Continue to monitor blood sugars daily WOUND #1: - Back Wound Laterality: Midline Prim Dressing: wound VAC 2 x Per Week/30 Days ary 1. Continue wound VAC 2. Continue in our clinic for nurse visits to change the wound VAC 3. Follow-up with me in 2 weeks Electronic Signature(s) Signed: 05/27/2021 10:28:01 AM By: Kalman Shan DO Entered By: Kalman Shan on 05/27/2021 10:27:10 -------------------------------------------------------------------------------- HxROS Details Patient Name: Date of Service: [name] 05/27/2021 9:45 A M Medical Record Number: [id number] Patient Account Number: [id number] Date of Birth/Sex: Treating RN: May 27, 1965 (56 y.o. [name] Primary Care Provider: [name] Other Clinician: Referring Provider: Treating Provider/Extender: [name] Weeks in Treatment: 8 Information Obtained From Patient Cardiovascular Medical History: Positive for: Hypertension Endocrine Medical History: Positive for: Type II Diabetes Time with diabetes: Since mid 90's Treated with: Oral agents Blood sugar tested every day: Yes Tested : 2x day Musculoskeletal Medical History: Positive for: Osteomyelitis Past Medical History Notes: DDD Immunizations Pneumococcal Vaccine: Received Pneumococcal Vaccination: Yes Received Pneumococcal Vaccination On or After 60th Birthday: No Implantable Devices Yes Family and Social History Cancer: No; Diabetes: Yes - Mother; Heart  Disease: No; Hereditary Spherocytosis: No; Hypertension: No; Kidney Disease: No; Lung Disease: No; Seizures: No; Stroke: Yes - Siblings; Thyroid Problems: No; Tuberculosis: No; Never smoker; Marital Status - Married; Alcohol Use: Rarely; Drug Use: No History; Caffeine Use: Rarely; Financial Concerns: No; Food, Clothing or Shelter Needs: No; Support System Lacking: No; Transportation Concerns: No Electronic Signature(s) Signed: 05/27/2021 10:28:01 AM By: Kalman Shan DO Signed: 05/27/2021 5:31:57 PM By: Lorrin Jackson Entered By: Kalman Shan on 05/27/2021 10:25:11 -------------------------------------------------------------------------------- SuperBill Details Patient Name: Date of Service: [name] 05/27/2021 Medical Record Number: [id number] Patient Account Number: [id number] Date of Birth/Sex: Treating RN: 12-06-1965 (56 y.o. 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 M86.9 Osteomyelitis, unspecified E11.622 Type 2 diabetes mellitus with other skin ulcer Facility Procedures CPT4 Code: [id number] Description: [phone] - WOUND VAC-50 SQ CM OR LESS Modifier: Quantity: 1 Physician Procedures : CPT4 Code Description Modifier [id number] - WC PHYS LEVEL 3 - EST PT ICD-10 Diagnosis Description L98.424 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

## 2021-05-30 ENCOUNTER — Encounter (HOSPITAL_BASED_OUTPATIENT_CLINIC_OR_DEPARTMENT_OTHER): Payer: No Typology Code available for payment source | Admitting: Internal Medicine

## 2021-05-30 ENCOUNTER — Other Ambulatory Visit: Payer: Self-pay

## 2021-05-30 DIAGNOSIS — E11622 Type 2 diabetes mellitus with other skin ulcer: Secondary | ICD-10-CM | POA: Diagnosis not present

## 2021-05-30 NOTE — Progress Notes (Signed)
AUTHUR, CUBIT ([id number]) Visit Report for 05/30/2021 SuperBill Details Patient Name: Date of Service: [name], [name] 05/30/2021 Medical Record Number: [id number] Patient Account Number: [id number] Date of Birth/Sex: Treating RN: [birth year] (56 y.o. 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

## 2021-05-30 NOTE — Progress Notes (Signed)
[name] ([id number]) Visit Report for 05/30/2021 Arrival Information Details Patient Name: Date of Service: [name], [name] 05/30/2021 9:45 A M Medical Record Number: [id number] Patient Account Number: [id number] Date of Birth/Sex: Treating RN: 1966/01/01 (56 y.o. [name] Primary Care Pallavi Clifton: [name] Other Clinician: Referring Tery Hoeger: Treating Fredrick Geoghegan/Extender: [name] in Treatment: 9 Visit Information History Since Last Visit Added or deleted any medications: No Patient Arrived: Wheel Chair Any new allergies or adverse reactions: No Arrival Time: 09:42 Had a fall or experienced change in No Accompanied By: friend activities of daily living that may affect Transfer Assistance: None risk of falls: Patient Identification Verified: Yes Signs or symptoms of abuse/neglect since last visito No Secondary Verification Process Completed: Yes Hospitalized since last visit: No Patient Requires Transmission-Based Precautions: No Implantable device outside of the clinic excluding No Patient Has Alerts: Yes cellular tissue based products placed in the center Patient Alerts: Patient on Blood Thinner since last visit: No BP Right Arm-PICC Has Dressing in Place as Prescribed: Yes Pain Present Now: No Electronic Signature(s) Signed: 05/30/2021 5:10:40 PM By: Lorrin Jackson Entered By: Lorrin Jackson on 05/30/2021 09:43:28 -------------------------------------------------------------------------------- Encounter Discharge Information Details Patient Name: Date of Service: Christian Brown E. 05/30/2021 9:45 A M Medical Record Number: [id number] Patient Account Number: [id number] Date of Birth/Sex: Treating RN: August 09, 1965 (56 y.o. [name] Primary Care Pierre Dellarocco: [name] Other Clinician: Referring Ladamien Rammel: Treating Ervin Hensley/Extender: [name] Weeks in Treatment: 9 Encounter Discharge Information Items Discharge Condition:  Stable Ambulatory Status: Wheelchair Discharge Destination: Home Transportation: Private Auto Accompanied By: Denman George Schedule Follow-up Appointment: Yes Clinical Summary of Care: Patient Declined Electronic Signature(s) Signed: 05/30/2021 5:10:40 PM By: Lorrin Jackson Entered By: Lorrin Jackson on 05/30/2021 10:11:36 -------------------------------------------------------------------------------- Negative Pressure Wound Therapy Maintenance (NPWT) Details Patient Name: Date of Service: [name] 05/30/2021 9:45 A M Medical Record Number: [id number] Patient Account Number: [id number] Date of Birth/Sex: Treating RN: 11/30/1965 (56 y.o. [name] Primary Care Lehi Phifer: [name] Other Clinician: Referring Jaishawn Witzke: Treating Lynlee Stratton/Extender: [name] Weeks in Treatment: 9 NPWT Maintenance Performed for: Wound #1 Midline Back Performed By: Lorrin Jackson, RN Type: VAC System Coverage Size (sq cm): 42 Pressure Type: Constant Pressure Setting: 125 mmHG Drain Type: None Sponge/Dressing Type: Combination : White, Black Date Initiated: 03/28/2021 Dressing Removed: Yes Canister Changed: No Dressing Reapplied: Yes Days On NPWT : 64 Electronic Signature(s) Signed: 05/30/2021 5:10:40 PM By: Lorrin Jackson Entered By: Lorrin Jackson on 05/30/2021 10:10:51 -------------------------------------------------------------------------------- Patient/Caregiver Education Details Patient Name: Date of Service: [name] 2/16/2023andnbsp9:45 [place] Record Number: [id number] Patient Account Number: [id number] Date of Birth/Gender: Treating RN: 02-02-1966 (56 y.o. [name] Primary Care Physician: [name] Other Clinician: Referring Physician: Treating Physician/Extender: [name] in Treatment: 9 Education Assessment Education Provided To: Patient Education Topics Provided Wound/Skin Impairment: Methods:  Demonstration, Explain/Verbal, Printed Responses: State content correctly [employer]) Signed: 05/30/2021 5:10:40 PM By: Lorrin Jackson Entered By: Lorrin Jackson on 05/30/2021 10:11:19 -------------------------------------------------------------------------------- Wound Assessment Details Patient Name: Date of Service: Christian Brown E. 05/30/2021 9:45 A M Medical Record Number: [id number] Patient Account Number: [id number] Date of Birth/Sex: Treating RN: [birth year] (56 y.o. [name] Primary Care Shaunte Weissinger: [name] Other Clinician: Referring Lucas Exline: Treating Bill Mcvey/Extender: [name] Weeks in Treatment: 9 Wound Status Wound Number: 1 Primary Etiology: Open Surgical Wound Wound Location: Midline Back Wound Status: Open Wounding Event: Surgical Injury Comorbid History: Hypertension,  Type II Diabetes, Osteomyelitis Date Acquired: 01/11/2021 Weeks Of Treatment: 9 Clustered Wound: No Wound Measurements Length: (cm) 12 Width: (cm) 3.5 Depth: (cm) 2.2 Area: (cm) 32.987 Volume: (cm) 72.571 % Reduction in Area: 56.4% % Reduction in Volume: 58.3% Epithelialization: Small (1-33%) Tunneling: Yes Position (o'clock): 12 Maximum Distance: (cm) 3.9 Undermining: No Wound Description Classification: Full Thickness Without Exposed Support Structures Wound Margin: Well defined, not attached Exudate Amount: Medium Exudate Type: Serosanguineous Exudate Color: red, brown Foul Odor After Cleansing: No Slough/Fibrino Yes Wound Bed Granulation Amount: Large (67-100%) Exposed Structure Granulation Quality: Red, Pink Fascia Exposed: No Necrotic Amount: Small (1-33%) Fat Layer (Subcutaneous Tissue) Exposed: Yes Necrotic Quality: Adherent Slough Tendon Exposed: No Muscle Exposed: No Joint Exposed: No Bone Exposed: No Treatment Notes Wound #1 (Back) Wound Laterality: Midline Cleanser Peri-Wound Care Topical Primary Dressing wound  VAC Secondary Dressing Secured With Compression Wrap Compression Stockings Add-Ons Electronic Signature(s) Signed: 05/30/2021 5:10:40 PM By: Lorrin Jackson Entered By: Lorrin Jackson on 05/30/2021 09:45:55 -------------------------------------------------------------------------------- Vitals Details Patient Name: Date of Service: Christian Brown E. 05/30/2021 9:45 A M Medical Record Number: [id number] Patient Account Number: [id number] Date of Birth/Sex: Treating RN: 1965-07-07 (56 y.o. [name] Primary Care Cleotha Tsang: [name] Other Clinician: Referring Shaneil Yazdi: Treating Joann Kulpa/Extender: [name] Weeks in Treatment: 9 Vital Signs Time Taken: 09:43 Temperature (F): 98.8 Height (in): 74 Pulse (bpm): 111 Respiratory Rate (breaths/min): 18 Blood Pressure (mmHg): 108/73 Reference Range: 80 - 120 mg / dl Electronic Signature(s) Signed: 05/30/2021 5:10:40 PM By: Lorrin Jackson Entered By: Lorrin Jackson on 05/30/2021 09:44:50

## 2021-05-31 ENCOUNTER — Other Ambulatory Visit: Payer: Self-pay | Admitting: Infectious Diseases

## 2021-05-31 ENCOUNTER — Other Ambulatory Visit (HOSPITAL_COMMUNITY): Payer: Self-pay

## 2021-05-31 ENCOUNTER — Encounter: Payer: Self-pay | Admitting: Infectious Diseases

## 2021-05-31 DIAGNOSIS — T847XXD Infection and inflammatory reaction due to other internal orthopedic prosthetic devices, implants and grafts, subsequent encounter: Secondary | ICD-10-CM

## 2021-05-31 MED ORDER — CEFADROXIL 500 MG PO CAPS
1000.0000 mg | ORAL_CAPSULE | Freq: Two times a day (BID) | ORAL | 1 refills | Status: DC
  Filled 2021-05-31: qty 112, 28d supply, fill #0
  Filled 2021-05-31: qty 8, 2d supply, fill #0

## 2021-05-31 MED ORDER — CEFADROXIL 500 MG PO CAPS: 500.0000 mg | ORAL_CAPSULE | Freq: Two times a day (BID) | ORAL | 5 refills | Status: DC

## 2021-05-31 MED ORDER — CEFADROXIL 500 MG PO CAPS: 1000.0000 mg | ORAL_CAPSULE | Freq: Two times a day (BID) | ORAL | 1 refills | Status: DC

## 2021-06-03 ENCOUNTER — Encounter (HOSPITAL_BASED_OUTPATIENT_CLINIC_OR_DEPARTMENT_OTHER): Payer: No Typology Code available for payment source | Admitting: Internal Medicine

## 2021-06-03 ENCOUNTER — Other Ambulatory Visit (HOSPITAL_COMMUNITY): Payer: Self-pay

## 2021-06-03 ENCOUNTER — Other Ambulatory Visit: Payer: Self-pay

## 2021-06-03 DIAGNOSIS — E11622 Type 2 diabetes mellitus with other skin ulcer: Secondary | ICD-10-CM | POA: Diagnosis not present

## 2021-06-03 NOTE — Progress Notes (Signed)
[name], [name] ([id number]) Visit Report for 06/03/2021 Arrival Information Details Patient Name: Date of Service: [name], [name] 06/03/2021 10:30 A M Medical Record Number: [id number] Patient Account Number: [id number] Date of Birth/Sex: Treating RN: 10/15/1965 (56 y.o. Ernestene Mention Primary Care Faheem Ziemann: [name] Other Clinician: Referring Phylicia Mcgaugh: Treating Karysa Heft/Extender: [name] in Treatment: 9 Visit Information History Since Last Visit Added or deleted any medications: No Patient Arrived: Wheel Chair Any new allergies or adverse reactions: No Arrival Time: 11:02 Had a fall or experienced change in No Accompanied By: son activities of daily living that may affect Transfer Assistance: None risk of falls: Patient Identification Verified: Yes Signs or symptoms of abuse/neglect since last visito No Secondary Verification Process Completed: Yes Hospitalized since last visit: No Patient Requires Transmission-Based Precautions: No Implantable device outside of the clinic excluding No Patient Has Alerts: Yes cellular tissue based products placed in the center Patient Alerts: Patient on Blood Thinner since last visit: No BP Right Arm-PICC Has Dressing in Place as Prescribed: Yes Pain Present Now: Yes Notes stays in chairs Electronic Signature(s) Signed: 06/03/2021 5:43:45 PM By: Baruch Gouty RN, BSN Entered By: Baruch Gouty on 06/03/2021 11:05:07 -------------------------------------------------------------------------------- Encounter Discharge Information Details Patient Name: Date of Service: Christian Brown E. 06/03/2021 10:30 A M Medical Record Number: [id number] Patient Account Number: [id number] Date of Birth/Sex: Treating RN: 1965/12/14 (56 y.o. Ernestene Mention Primary Care Vignesh Willert: [name] Other Clinician: Referring Doll Frazee: Treating Ithzel Fedorchak/Extender: [name] in Treatment: 9 Encounter Discharge  Information Items Discharge Condition: Stable Ambulatory Status: Wheelchair Discharge Destination: Home Transportation: Private Auto Accompanied By: son Schedule Follow-up Appointment: Yes Clinical Summary of Care: Patient Declined Electronic Signature(s) Signed: 06/03/2021 5:43:45 PM By: Baruch Gouty RN, BSN Entered By: Baruch Gouty on 06/03/2021 11:35:39 -------------------------------------------------------------------------------- Negative Pressure Wound Therapy Maintenance (NPWT) Details Patient Name: Date of Service: [name], [name] 06/03/2021 10:30 A M Medical Record Number: [id number] Patient Account Number: [id number] Date of Birth/Sex: Treating RN: 1965-04-21 (56 y.o. Ernestene Mention Primary Care Shima Compere: [name] Other Clinician: Referring Ygnacio Fecteau: Treating Arsal Tappan/Extender: [name] Weeks in Treatment: 9 NPWT Maintenance Performed for: Wound #1 Midline Back Performed By: Baruch Gouty, RN Type: VAC System Coverage Size (sq cm): 42 Pressure Type: Constant Pressure Setting: 125 mmHG Drain Type: None Sponge/Dressing Type: Combination : white and black sponge Date Initiated: 03/28/2021 Dressing Removed: No Quantity of Sponges/Gauze Removed: 3 Canister Changed: No Canister Exudate Volume: 200 Dressing Reapplied: No Quantity of Sponges/Gauze Inserted: 2 Respones T Treatment: o good Days On NPWT : 68 Electronic Signature(s) Signed: 06/03/2021 5:43:45 PM By: Baruch Gouty RN, BSN Entered By: Baruch Gouty on 06/03/2021 11:34:43 -------------------------------------------------------------------------------- Pain Assessment Details Patient Name: Date of Service: Christian Brown E. 06/03/2021 10:30 A M Medical Record Number: [id number] Patient Account Number: [id number] Date of Birth/Sex: Treating RN: 1965-12-07 (56 y.o. Ernestene Mention Primary Care Phyliss Hulick: [name] Other Clinician: Referring Addilyne Backs: Treating  Aminat Shelburne/Extender: [name] Weeks in Treatment: 9 Active Problems Location of Pain Severity and Description of Pain Patient Has Paino Yes Site Locations Pain Location: Generalized Pain With Dressing Change: No Duration of the Pain. Constant / Intermittento Constant Rate the pain. Current Pain Level: 7 Worst Pain Level: 8 Least Pain Level: 6 Character of Pain Describe the Pain: Aching Pain Management and Medication Current Pain Management: Medication: Yes Is the Current Pain Management Adequate: Adequate How does your wound impact your activities of daily livingo Sleep: No  Bathing: No Appetite: No Relationship With Others: No Bladder Continence: No Emotions: No Bowel Continence: No Work: No Toileting: No Drive: No Dressing: No Hobbies: No Engineer, maintenance) Signed: 06/03/2021 5:43:45 PM By: Baruch Gouty RN, BSN Entered By: Baruch Gouty on 06/03/2021 11:05:53 -------------------------------------------------------------------------------- Patient/Caregiver Education Details Patient Name: Date of Service: [name] 2/20/2023andnbsp10:30 [place] Record Number: [id number] Patient Account Number: [id number] Date of Birth/Gender: Treating RN: 28-Nov-1965 (56 y.o. Ernestene Mention Primary Care Physician: [name] Other Clinician: Referring Physician: Treating Physician/Extender: [name] in Treatment: 9 Education Assessment Education Provided To: Patient Education Topics Provided Wound/Skin Impairment: Methods: Explain/Verbal Responses: Reinforcements needed, State content correctly [employer]) Signed: 06/03/2021 5:43:45 PM By: Baruch Gouty RN, BSN Entered By: Baruch Gouty on 06/03/2021 11:35:26 -------------------------------------------------------------------------------- Wound Assessment Details Patient Name: Date of Service: Christian Brown E. 06/03/2021 10:30 A M Medical Record  Number: [id number] Patient Account Number: [id number] Date of Birth/Sex: Treating RN: [birth year] (56 y.o. Ernestene Mention Primary Care Gwenda Heiner: [name] Other Clinician: Referring Avaline Stillson: Treating Erastus Bartolomei/Extender: [name] Weeks in Treatment: 9 Wound Status Wound Number: 1 Primary Etiology: Open Surgical Wound Wound Location: Midline Back Wound Status: Open Wounding Event: Surgical Injury Date Acquired: 01/11/2021 Weeks Of Treatment: 9 Clustered Wound: No Wound Measurements Length: (cm) 12 Width: (cm) 3.5 Depth: (cm) 2.2 Area: (cm) 32.987 Volume: (cm) 72.571 % Reduction in Area: 56.4% % Reduction in Volume: 58.3% Wound Description Classification: Full Thickness Without Exposed Support Structu Exudate Amount: Medium Exudate Type: Serosanguineous Exudate Color: red, brown res Treatment Notes Wound #1 (Back) Wound Laterality: Midline Cleanser Peri-Wound Care Topical Primary Dressing wound VAC Secondary Dressing Secured With Compression Wrap Compression Stockings Add-Ons Electronic Signature(s) Signed: 06/03/2021 5:43:45 PM By: Baruch Gouty RN, BSN Entered By: Baruch Gouty on 06/03/2021 11:33:41 -------------------------------------------------------------------------------- Vitals Details Patient Name: Date of Service: Christian Brown E. 06/03/2021 10:30 A M Medical Record Number: [id number] Patient Account Number: [id number] Date of Birth/Sex: Treating RN: 1965/09/21 (56 y.o. Ernestene Mention Primary Care Noor Vidales: [name] Other Clinician: Referring Armand Preast: Treating Bashir Marchetti/Extender: [name] Weeks in Treatment: 9 Vital Signs Time Taken: 11:07 Temperature (F): 97.8 Height (in): 74 Pulse (bpm): 58 Respiratory Rate (breaths/min): 18 Blood Pressure (mmHg): 88/56 Reference Range: 80 - 120 mg / dl Electronic Signature(s) Signed: 06/03/2021 5:43:45 PM By: Baruch Gouty RN, BSN Entered By: Baruch Gouty on 06/03/2021 11:08:23

## 2021-06-03 NOTE — Therapy (Signed)
OUTPATIENT PHYSICAL THERAPY THORACOLUMBAR EVALUATION   Patient Name: [name] MRN: [id number] DOB:Jan 19, 1966, 56 y.o., male Today's Date: 06/05/2021   PT End of Session - 06/04/21 1646     Visit Number 1    Number of Visits 25    Date for PT Re-Evaluation 08/31/21    Authorization Type [facility]    PT Start Time 0737    PT Stop Time 1744    PT Time Calculation (min) 57 min    Equipment Utilized During Treatment Gait belt;Other (comment)   RW; WC   Activity Tolerance Patient tolerated treatment well    Behavior During Therapy WFL for tasks assessed/performed             Past Medical History:  Diagnosis Date   DDD (degenerative disc disease), lumbar    Diabetes mellitus    Hyperlipemia    Hypertension    Obesity    OSA (obstructive sleep apnea)    on CPAP   Past Surgical History:  Procedure Laterality Date   APPLICATION OF ROBOTIC ASSISTANCE FOR SPINAL PROCEDURE N/A 01/11/2021   Procedure: APPLICATION OF ROBOTIC ASSISTANCE FOR SPINAL PROCEDURE;  Surgeon: Judith Part, MD;  Location: [facility];  Service: Neurosurgery;  Laterality: N/A;   APPLICATION OF WOUND VAC N/A 01/25/2021   Procedure: APPLICATION OF WOUND VAC;  Surgeon: Dawley, Theodoro Doing, DO;  Location: [facility];  Service: Neurosurgery;  Laterality: N/A;   BACK SURGERY     COLONOSCOPY  2019   LUMBAR WOUND DEBRIDEMENT N/A 01/25/2021   Procedure: Irrigation and Debridement of lumbar wound, and wound vacuum assisted closure;  Surgeon: Dawley, Theodoro Doing, DO;  Location: [facility];  Service: Neurosurgery;  Laterality: N/A;   TRANSFORAMINAL LUMBAR INTERBODY FUSION (TLIF) WITH PEDICLE SCREW FIXATION 4 LEVEL N/A 01/11/2021   Procedure: Lumbar one-two, Lumbar two-three, Lumbar three-four, Lumbar four-five, Lumbar five Sacral one Open decompression, Transforaminal lumbar interbody fusion, posterolateral instrumented fusion;  Surgeon: Judith Part, MD;  Location: [facility];  Service: Neurosurgery;  Laterality: N/A;   Patient Active  Problem List   Diagnosis Date Noted   Loosening of hardware in spine ([place]) 10/62/6948   Hardware complicating wound infection ([place]) 03/13/2021   Medication monitoring encounter 03/13/2021   Slow transit constipation    Disto-occlusion    Lumbar discitis    Epidural abscess    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[facility])    Hypoalbuminemia due to protein-calorie malnutrition ([facility])    Acute blood loss anemia    Other chronic postprocedural pain    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   Klebsiella pneumoniae infection 01/28/2021   Wound infection after surgery 01/25/2021   Muscle spasms of both lower extremities 01/25/2021   Abdominal pain 01/25/2021   Spleen enlarged 01/25/2021   Lumbar radiculopathy 01/11/2021   Spasticity 12/03/2020   Neuropathy 09/28/2020   Chronic pain syndrome 09/28/2020   Intervertebral lumbar disc disorder with myelopathy, lumbar region 09/28/2020   Spondylosis, cervical, with myelopathy 09/28/2020   Unilateral primary osteoarthritis, right knee 10/28/2019   PCP NOTES >>>>>>>> 07/09/2015   DJD (degenerative joint disease) 10/27/2014   Screening for heart disease 06/28/2012   OSA (obstructive sleep apnea) 06/14/2012   ED (erectile dysfunction) 04/26/2012   Annual physical exam 12/29/2011   Achilles tendinitis 09/10/2010   Allergic rhinitis 07/12/2010   Hyperlipidemia 04/26/2007   DM II (diabetes mellitus, type II), controlled ([place]) 04/22/2006   OBESITY NOS 04/22/2006   Essential hypertension 04/22/2006    PCP: Colon Branch, MD  REFERRING PROVIDER: Bary Leriche, PA-C  REFERRING DIAG: M51.06 (ICD-10-CM) - Lumbar disc herniation with myelopathy   THERAPY DIAG:  Chronic bilateral low back pain, unspecified whether sciatica present  Muscle weakness (generalized)  Other abnormalities of gait and mobility  ONSET DATE: 01/11/21  SUBJECTIVE:                                                                                                                                                                                            SUBJECTIVE STATEMENT: Patient underwent lumbar surgery on 01/11/21 and this was complicated by an infection. He was in inpatient rehab for over a month and still has a wound vac in place, receiving wound care twice weekly. He reports weakness in his legs and difficulty walking and keeping his balance. He is currently using RW for household ambulation with a  family member typically following closely and manual W/C for community ambulation. He reports his pain is staying consistent at a 7-8/10 in his low back reporting that the MD states that this could be due "instrumentation loosening" in his back, but does not have plans to complete additional surgery at this time. No numbness/tingling. History of urinary retention, but this has resolved.   PERTINENT HISTORY:  s/p L1-S1 decompression and instrumented fusion 01/11/21 Inpatient rehab: 01/18/21- 03/08/21 Incision and debridement of lumbar wound, placement of wound VAC 01/25/21 Wound care visits occurring on Monday and Thursday  PAIN:  Are you having pain? Yes NPRS scale: 8/10 Pain location: low back  PAIN TYPE: stabbing, dull  Pain description: constant  Aggravating factors: prolonged sitting  Relieving factors: laying supine   PRECAUTIONS: Other: wound ; fall  WEIGHT BEARING RESTRICTIONS No  FALLS:  Has patient fallen in last 6 months? Yes, Number of falls: 1  LIVING ENVIRONMENT: Lives with: lives with their family Lives in: House/apartment Stairs: Yes; 3 stairs to enter  Has following equipment at home: Gilford Rile - 2 wheeled and Wheelchair (manual)  OCCUPATION: disability   PLOF: independent with dressing; needs assistance with bathing, stair negotiation.   PATIENT GOALS "To be able to walk and drive. Take my kids back to school."    OBJECTIVE:   DIAGNOSTIC FINDINGS:  IMPRESSION: 1. L1-L2 discitis osteomyelitis with pronounced endplate erosion and generalized  hardware loosening since October. Retropulsion of the interbody implant into the right lateral recess and proximal right L1 neural foramen. Right L1 pedicle screw also partially eroded into that foramen.   2. Generalized paraspinal soft tissue infection and inflammation associated with #1, including a posterior superficial soft tissue wound that tracks cephalad to the right T11 vertebral level.   3.  Underlying lower thoracic interbody ankylosis which remains solid through the T12-L1 anterior disc space. The spinal fusion hardware L3 through S1 remains stable since October, but there is no convincing arthrodesis at those levels.  PATIENT SURVEYS:  FOTO 31% to 40%   SCREENING FOR RED FLAGS: Bowel or bladder incontinence: No Cauda equina syndrome: No  COGNITION:  Overall cognitive status: Within functional limits for tasks assessed     SENSATION:  Light touch: not assessed     POSTURE:  Slump sitting posture; forward head/rounded shoulders   PALPATION: Not assessed    LE MMT:  MMT Right 06/05/2021 Left 06/05/2021  Hip flexion 3- 3-  Hip extension    Hip abduction    Hip adduction    Hip internal rotation    Hip external rotation    Knee flexion 4 4  Knee extension 4+ 4+  Ankle dorsiflexion 5 5  Ankle plantarflexion 4 4  Ankle inversion    Ankle eversion     (Blank rows = not tested)  FUNCTIONAL TESTS:   5 x STS: 17 seconds use of BUE   TUG: 25 seconds RW   2 MWT: 284 ft CGA  BERG BALANCE TEST Sitting to Standing: 3.      Stands independently using hands Standing Unsupported: 4.      Stands safely for 2 minutes Sitting Unsupported: 4.     Sits for 2 minutes independently Standing to Sitting: 4.     Sits safely with minimal use of hands Transfers: 3.     Transfers safely definite use of hands Standing with eyes closed: 3.     Stands 10 seconds with supervision Standing with feet together: 3.     Stands for 1 minute with supervision Reaching forward with  outstretched arm: 1.     Reaches forward with supervision Retrieving object from the floor: 3.     Able to pick up with supervision Turning to look behind: 2.     Turns sideways only, maintains balance Turning 360 degrees: 2.     Able to turn slowly, but safely Place alternate foot on stool: 0.     Unable, needs assist to keep from falling Standing with one foot in front: 2.     Independent small step for 30 seconds Standing on one foot: 0.     Unable  Total Score: 34/56   GAIT: Distance walked: 284 ft  Assistive device utilized: Walker - 2 wheeled Level of assistance: CGA Comments: flexed posture, foot flat initial contact, limited push-off, WBOS    TODAY'S TREATMENT  [facility] Adult PT Treatment:                                                DATE: 06/04/21 Therapeutic Exercise: Demonstrated and issued initial HEP.   Therapeutic Activity: Education on assessment findings that will be addressed throughout duration of POC.       PATIENT EDUCATION:  Education details: see treatment Person educated: Patient; family member  Education method: Explanation, Demonstration, Tactile cues, Verbal cues, and Handouts Education comprehension: verbalized understanding, returned demonstration, verbal cues required, tactile cues required, and needs further education   HOME EXERCISE PROGRAM: Access Code: [email or web address] URL: [email or web address] Date: 06/05/2021 Prepared by: Gwendolyn Grant  Exercises Seated March - 2 x daily - 7 x weekly - 3 sets - 10 reps Seated Hip Abduction  with Resistance - 2 x daily - 7 x weekly - 3 sets - 10 reps Seated Long Arc Quad - 2 x daily - 7 x weekly - 3 sets - 10 reps Seated Heel Raise - 2 x daily - 7 x weekly - 3 sets - 10 reps Standing Romberg to 1/2 Tandem Stance - 1 x daily - 7 x weekly - 3 sets - 30 sec hold   ASSESSMENT:  CLINICAL IMPRESSION: Patient is a 56 y.o. male who was seen today for physical therapy evaluation and treatment for s/p  L1-S1 decompression and instrumented fusion 1/61/09 that was complicated by an infection that is still currently being treated by wound care twice weekly. Upon assessment he is noted to have significant LE strength deficits, balance impairments, gait abnormalities/instability, pain, decreased activity tolerance, and scores at a high fall risk based upon BERG, TUG, and 5 x STS. Prior to this surgery he was independent with all ADLs/IADLs. He will benefit from skilled PT to address the above stated deficits in order to optimize his function and improve his independence.    OBJECTIVE IMPAIRMENTS Abnormal gait, decreased activity tolerance, decreased balance, decreased endurance, decreased mobility, difficulty walking, decreased ROM, decreased strength, impaired flexibility, improper body mechanics, postural dysfunction, obesity, and pain.   ACTIVITY LIMITATIONS cleaning, community activity, driving, meal prep, occupation, laundry, shopping, and yard work.   PERSONAL FACTORS Age, Fitness, and Time since onset of injury/illness/exacerbation are also affecting patient's functional outcome.    REHAB POTENTIAL: Fair    CLINICAL DECISION MAKING: Evolving/moderate complexity  EVALUATION COMPLEXITY: Moderate   GOALS: Goals reviewed with patient? No  SHORT TERM GOALS:  STG Name Target Date Goal status  1 Patient will be independent and compliant with initial HEP.   Baseline:  06/26/2021 INITIAL  2 Patient will be able to complete sit <> stand transfer without use of UE support.  Baseline:  07/17/2021 INITIAL  3 Patient will complete TUG in <15 seconds with LRAD to reduce his risk of future falls.  Baseline: 07/17/2021 INITIAL  4 Patient will tolerate at least 15 minutes of continuous standing activity without LOB.  Baseline: frequent rest breaks during BERG with LOB  07/17/2021 INITIAL   LONG TERM GOALS:   LTG Name Target Date Goal status  1 Patient will be modified independent with stair  negotiation.  Baseline: 08/28/2021 INITIAL  2 Patient will be modified independent with floor transfer Baseline: per patient required 4 person assist to transfer when he had his fall.  08/28/2021 INITIAL  3 Patient will score at least 45/56 on the BERG to signify improvements in balance and reduction in fall risk.  Baseline: 08/28/2021 INITIAL  4 Patient will be tolerate at least 10 minutes of walking with LRAD to improve his independence with household and community ambulation.  Baseline: SBA with RW in house; manual W/C community ambulation 08/28/2021 INITIAL  5 Patient will score at least 40% function on FOTO to signify clinically meaningful improvement in function.  08/28/21 Initial    PLAN: PT FREQUENCY: 2x/week  PT DURATION: 12 weeks  PLANNED INTERVENTIONS: Therapeutic exercises, Therapeutic activity, Neuro Muscular re-education, Balance training, Gait training, Patient/Family education, Stair training, Wheelchair mobility training, Cryotherapy, Moist heat, and Manual therapy  PLAN FOR NEXT SESSION: review HEP, standing strengthening as tolerated, balance, hip strengthening.   Gwendolyn Grant, PT, DPT, ATC 06/05/21 9:16 AM

## 2021-06-03 NOTE — Progress Notes (Signed)
JAHLON, BAINES ([id number]) Visit Report for 06/03/2021 SuperBill Details Patient Name: Date of Service: OCTAVIA, MOTTOLA 06/03/2021 Medical Record Number: [id number] Patient Account Number: [id number] Date of Birth/Sex: Treating RN: 05-25-1965 (56 y.o. 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

## 2021-06-04 ENCOUNTER — Ambulatory Visit: Payer: No Typology Code available for payment source | Attending: Physical Medicine and Rehabilitation

## 2021-06-04 DIAGNOSIS — M6281 Muscle weakness (generalized): Secondary | ICD-10-CM | POA: Insufficient documentation

## 2021-06-04 DIAGNOSIS — R2689 Other abnormalities of gait and mobility: Secondary | ICD-10-CM | POA: Diagnosis present

## 2021-06-04 DIAGNOSIS — G8929 Other chronic pain: Secondary | ICD-10-CM | POA: Diagnosis present

## 2021-06-04 DIAGNOSIS — M545 Low back pain, unspecified: Secondary | ICD-10-CM | POA: Diagnosis not present

## 2021-06-05 ENCOUNTER — Telehealth: Payer: Self-pay

## 2021-06-05 ENCOUNTER — Other Ambulatory Visit (HOSPITAL_COMMUNITY): Payer: Self-pay

## 2021-06-05 ENCOUNTER — Other Ambulatory Visit: Payer: Self-pay | Admitting: Physical Medicine and Rehabilitation

## 2021-06-05 MED ORDER — MORPHINE SULFATE ER 15 MG PO TBCR
30.0000 mg | EXTENDED_RELEASE_TABLET | Freq: Two times a day (BID) | ORAL | 0 refills | Status: DC
  Filled 2021-06-05: qty 60, 20d supply, fill #0
  Filled 2021-06-10: qty 60, 15d supply, fill #0

## 2021-06-05 NOTE — Telephone Encounter (Signed)
PA for Morphine Sulfate sent to insurance through [facility]

## 2021-06-06 ENCOUNTER — Encounter (HOSPITAL_BASED_OUTPATIENT_CLINIC_OR_DEPARTMENT_OTHER): Payer: No Typology Code available for payment source | Admitting: Internal Medicine

## 2021-06-06 ENCOUNTER — Other Ambulatory Visit: Payer: Self-pay

## 2021-06-06 DIAGNOSIS — E11622 Type 2 diabetes mellitus with other skin ulcer: Secondary | ICD-10-CM | POA: Diagnosis not present

## 2021-06-06 NOTE — Progress Notes (Signed)
ELIH, MOONEY ([id number]) Visit Report for 06/06/2021 SuperBill Details Patient Name: Date of Service: JOMO, FORAND 06/06/2021 Medical Record Number: [id number] Patient Account Number: [id number] Date of Birth/Sex: Treating RN: 18-Jan-1966 (56 y.o. 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

## 2021-06-10 ENCOUNTER — Ambulatory Visit: Payer: No Typology Code available for payment source

## 2021-06-10 ENCOUNTER — Encounter (HOSPITAL_BASED_OUTPATIENT_CLINIC_OR_DEPARTMENT_OTHER): Payer: No Typology Code available for payment source | Admitting: Internal Medicine

## 2021-06-10 ENCOUNTER — Other Ambulatory Visit: Payer: Self-pay

## 2021-06-10 ENCOUNTER — Other Ambulatory Visit (HOSPITAL_COMMUNITY): Payer: Self-pay

## 2021-06-10 DIAGNOSIS — E11622 Type 2 diabetes mellitus with other skin ulcer: Secondary | ICD-10-CM | POA: Diagnosis not present

## 2021-06-10 DIAGNOSIS — G8929 Other chronic pain: Secondary | ICD-10-CM

## 2021-06-10 DIAGNOSIS — M545 Low back pain, unspecified: Secondary | ICD-10-CM | POA: Diagnosis not present

## 2021-06-10 DIAGNOSIS — L98424 Non-pressure chronic ulcer of back with necrosis of bone: Secondary | ICD-10-CM | POA: Diagnosis not present

## 2021-06-10 DIAGNOSIS — M869 Osteomyelitis, unspecified: Secondary | ICD-10-CM

## 2021-06-10 DIAGNOSIS — M6281 Muscle weakness (generalized): Secondary | ICD-10-CM

## 2021-06-10 NOTE — Therapy (Signed)
OUTPATIENT PHYSICAL THERAPY TREATMENT NOTE   Patient Name: [name] MRN: [id number] DOB:10-Jul-1965, 56 y.o., male Today's Date: 06/10/2021  PCP: Colon Branch, MD REFERRING PROVIDER: Colon Branch, MD   PT End of Session - 06/10/21 1400     Visit Number 2    Number of Visits 25    Date for PT Re-Evaluation 08/31/21    Authorization Type [facility]    PT Start Time 1400    PT Stop Time 1442    PT Time Calculation (min) 42 min    Equipment Utilized During Treatment Gait belt;Other (comment)   RW; WC   Activity Tolerance Patient tolerated treatment well    Behavior During Therapy WFL for tasks assessed/performed             Past Medical History:  Diagnosis Date   DDD (degenerative disc disease), lumbar    Diabetes mellitus    Hyperlipemia    Hypertension    Obesity    OSA (obstructive sleep apnea)    on CPAP   Past Surgical History:  Procedure Laterality Date   APPLICATION OF ROBOTIC ASSISTANCE FOR SPINAL PROCEDURE N/A 01/11/2021   Procedure: APPLICATION OF ROBOTIC ASSISTANCE FOR SPINAL PROCEDURE;  Surgeon: Judith Part, MD;  Location: [place];  Service: Neurosurgery;  Laterality: N/A;   APPLICATION OF WOUND VAC N/A 01/25/2021   Procedure: APPLICATION OF WOUND VAC;  Surgeon: Dawley, Theodoro Doing, DO;  Location: [place];  Service: Neurosurgery;  Laterality: N/A;   BACK SURGERY     COLONOSCOPY  2019   LUMBAR WOUND DEBRIDEMENT N/A 01/25/2021   Procedure: Irrigation and Debridement of lumbar wound, and wound vacuum assisted closure;  Surgeon: Dawley, Theodoro Doing, DO;  Location: [place];  Service: Neurosurgery;  Laterality: N/A;   TRANSFORAMINAL LUMBAR INTERBODY FUSION (TLIF) WITH PEDICLE SCREW FIXATION 4 LEVEL N/A 01/11/2021   Procedure: Lumbar one-two, Lumbar two-three, Lumbar three-four, Lumbar four-five, Lumbar five Sacral one Open decompression, Transforaminal lumbar interbody fusion, posterolateral instrumented fusion;  Surgeon: Judith Part, MD;  Location: [place];  Service:  Neurosurgery;  Laterality: N/A;   Patient Active Problem List   Diagnosis Date Noted   Loosening of hardware in spine ([place]) 14/78/2956   Hardware complicating wound infection ([place]) 03/13/2021   Medication monitoring encounter 03/13/2021   Slow transit constipation    Disto-occlusion    Lumbar discitis    Epidural abscess    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[facility])    Hypoalbuminemia due to protein-calorie malnutrition ([facility])    Acute blood loss anemia    Other chronic postprocedural pain    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   Klebsiella pneumoniae infection 01/28/2021   Wound infection after surgery 01/25/2021   Muscle spasms of both lower extremities 01/25/2021   Abdominal pain 01/25/2021   Spleen enlarged 01/25/2021   Lumbar radiculopathy 01/11/2021   Spasticity 12/03/2020   Neuropathy 09/28/2020   Chronic pain syndrome 09/28/2020   Intervertebral lumbar disc disorder with myelopathy, lumbar region 09/28/2020   Spondylosis, cervical, with myelopathy 09/28/2020   Unilateral primary osteoarthritis, right knee 10/28/2019   PCP NOTES >>>>>>>> 07/09/2015   DJD (degenerative joint disease) 10/27/2014   Screening for heart disease 06/28/2012   OSA (obstructive sleep apnea) 06/14/2012   ED (erectile dysfunction) 04/26/2012   Annual physical exam 12/29/2011   Achilles tendinitis 09/10/2010   Allergic rhinitis 07/12/2010   Hyperlipidemia 04/26/2007   DM II (diabetes mellitus, type II), controlled ([place]) 04/22/2006   OBESITY NOS 04/22/2006  Essential hypertension 04/22/2006    REFERRING DIAG:  M51.06 (ICD-10-CM) - Lumbar disc herniation with myelopathy   THERAPY DIAG:  Chronic bilateral low back pain, unspecified whether sciatica present  Muscle weakness (generalized)  PERTINENT HISTORY:  s/p L1-S1 decompression and instrumented fusion 01/11/21 Inpatient rehab: 01/18/21- 03/08/21 Incision and debridement of lumbar wound, placement of wound VAC 01/25/21 Wound care visits occurring  on Monday and Thursday  PRECAUTIONS: Wound and Falls  SUBJECTIVE:  Pt presents to PT with reports of decreased lower back pain and discomfort.   Pain Assessment: Are you having pain? Yes NPRS scale: 6/10 Pain location: low back  PAIN TYPE: stabbing, dull  Pain description: constant  Aggravating factors: prolonged sitting  Relieving factors: laying supine     OBJECTIVE:    PATIENT SURVEYS:  FOTO 31% to 40%    SCREENING FOR RED FLAGS: Bowel or bladder incontinence: No Cauda equina syndrome: No    LE MMT:   MMT Right 06/05/2021 Left 06/05/2021  Hip flexion 3- 3-  Hip extension      Hip abduction      Hip adduction      Hip internal rotation      Hip external rotation      Knee flexion 4 4  Knee extension 4+ 4+  Ankle dorsiflexion 5 5  Ankle plantarflexion 4 4  Ankle inversion      Ankle eversion       (Blank rows = not tested)   FUNCTIONAL TESTS:            5 x STS: 17 seconds use of BUE            TUG: 25 seconds RW            2 MWT: 284 ft CGA   BERG BALANCE TEST Sitting to Standing: 3.      Stands independently using hands Standing Unsupported: 4.      Stands safely for 2 minutes Sitting Unsupported: 4.     Sits for 2 minutes independently Standing to Sitting: 4.     Sits safely with minimal use of hands Transfers: 3.     Transfers safely definite use of hands Standing with eyes closed: 3.     Stands 10 seconds with supervision Standing with feet together: 3.     Stands for 1 minute with supervision Reaching forward with outstretched arm: 1.     Reaches forward with supervision Retrieving object from the floor: 3.     Able to pick up with supervision Turning to look behind: 2.     Turns sideways only, maintains balance Turning 360 degrees: 2.     Able to turn slowly, but safely Place alternate foot on stool: 0.     Unable, needs assist to keep from falling Standing with one foot in front: 2.     Independent small step for 30 seconds Standing on one foot:  0.     Unable   Total Score: 34/56    GAIT: Distance walked: 284 ft  Assistive device utilized: Walker - 2 wheeled Level of assistance: CGA Comments: flexed posture, foot flat initial contact, limited push-off, WBOS       TODAY'S TREATMENT  [facility] Adult PT Treatment:  DATE: 06/10/2021 Therapeutic Exercise: LAQ 2x10 3# Seated hamstring curl 2x10 GTB Sit to Stand 3x5  Seated clamshell 3x15 Sit to Stand 2x20 black TB Standing hip abd 2x10 in // Standing hip ext x 10 each in // Seated ball squeeze 2x10 - 5" hold Seated horizontal abd 2x15 GTB Seated row 2x10 GTB    [facility] Adult PT Treatment:                                                DATE: 06/04/2021 Therapeutic Exercise: Demonstrated and issued initial HEP.    Therapeutic Activity: Education on assessment findings that will be addressed throughout duration of POC.            PATIENT EDUCATION:  Education details: see treatment Person educated: Patient; family member  Education method: Explanation, Demonstration, Tactile cues, Verbal cues, and Handouts Education comprehension: verbalized understanding, returned demonstration, verbal cues required, tactile cues required, and needs further education     HOME EXERCISE PROGRAM: Access Code: [place] URL: [email or web address] Date: 06/05/2021 Prepared by: Gwendolyn Grant   Exercises Seated March - 2 x daily - 7 x weekly - 3 sets - 10 reps Seated Hip Abduction with Resistance - 2 x daily - 7 x weekly - 3 sets - 10 reps Seated Long Arc Quad - 2 x daily - 7 x weekly - 3 sets - 10 reps Seated Heel Raise - 2 x daily - 7 x weekly - 3 sets - 10 reps Standing Romberg to 1/2 Tandem Stance - 1 x daily - 7 x weekly - 3 sets - 30 sec hold     ASSESSMENT:   CLINICAL IMPRESSION: Pt was able to complete all prescribed exercises with no adverse effect. He fatigue quickly with standing activity, as evidenced by decreased  spinal extension and UE reliance while in //. Pt continues to benefit from skilled PT services and will continue to be seen and progressed as tolerated.      OBJECTIVE IMPAIRMENTS Abnormal gait, decreased activity tolerance, decreased balance, decreased endurance, decreased mobility, difficulty walking, decreased ROM, decreased strength, impaired flexibility, improper body mechanics, postural dysfunction, obesity, and pain.    ACTIVITY LIMITATIONS cleaning, community activity, driving, meal prep, occupation, laundry, shopping, and yard work.    PERSONAL FACTORS Age, Fitness, and Time since onset of injury/illness/exacerbation are also affecting patient's functional outcome.      GOALS: Goals reviewed with patient? No   SHORT TERM GOALS:   STG Name Target Date Goal status  1 Patient will be independent and compliant with initial HEP.    Baseline:  06/26/2021 INITIAL  2 Patient will be able to complete sit <> stand transfer without use of UE support.  Baseline:  07/17/2021 INITIAL  3 Patient will complete TUG in <15 seconds with LRAD to reduce his risk of future falls.  Baseline: 07/17/2021 INITIAL  4 Patient will tolerate at least 15 minutes of continuous standing activity without LOB.  Baseline: frequent rest breaks during BERG with LOB  07/17/2021 INITIAL    LONG TERM GOALS:    LTG Name Target Date Goal status  1 Patient will be modified independent with stair negotiation.  Baseline: 08/28/2021 INITIAL  2 Patient will be modified independent with floor transfer Baseline: per patient required 4 person assist to transfer when he had his fall.  08/28/2021 INITIAL  3 Patient will score  at least 45/56 on the BERG to signify improvements in balance and reduction in fall risk.  Baseline: 08/28/2021 INITIAL  4 Patient will be tolerate at least 10 minutes of walking with LRAD to improve his independence with household and community ambulation.  Baseline: SBA with RW in house; manual W/C community  ambulation 08/28/2021 INITIAL  5 Patient will score at least 40% function on FOTO to signify clinically meaningful improvement in function.  08/28/21 Initial     PLAN: PT FREQUENCY: 2x/week   PT DURATION: 12 weeks   PLANNED INTERVENTIONS: Therapeutic exercises, Therapeutic activity, Neuro Muscular re-education, Balance training, Gait training, Patient/Family education, Stair training, Wheelchair mobility training, Cryotherapy, Moist heat, and Manual therapy   PLAN FOR NEXT SESSION: review HEP, standing strengthening as tolerated, balance, hip strengthening.     Ward Chatters, PT 06/10/2021, 2:45 PM

## 2021-06-10 NOTE — Progress Notes (Signed)
Christian, Murphy ([id number]) Visit Report for 06/10/2021 Arrival Information Details Patient Name: Date of Service: Christian, Murphy 06/10/2021 9:45 A M Medical Record Number: [id number] Patient Account Number: [id number] Date of Birth/Sex: Treating RN: 1965/12/01 (56 y.o. [name] Primary Care Fayth Trefry: [name] Other Clinician: Referring Pura Picinich: Treating Shamon Cothran/Extender: [name] in Treatment: 10 Visit Information History Since Last Visit Added or deleted any medications: No Patient Arrived: Wheel Chair Any new allergies or adverse reactions: No Arrival Time: 10:04 Had a fall or experienced change in No Transfer Assistance: None activities of daily living that may affect Patient Identification Verified: Yes risk of falls: Secondary Verification Process Completed: Yes Signs or symptoms of abuse/neglect since last visito No Patient Requires Transmission-Based Precautions: No Hospitalized since last visit: No Patient Has Alerts: Yes Has Dressing in Place as Prescribed: Yes Patient Alerts: Patient on Blood Thinner Has Compression in Place as Prescribed: Yes No BP Right Arm-PICC Pain Present Now: Yes Electronic Signature(s) Signed: 06/10/2021 11:32:19 AM By: Sharyn Creamer RN, BSN Entered By: Sharyn Creamer on 06/10/2021 10:05:31 -------------------------------------------------------------------------------- Encounter Discharge Information Details Patient Name: Date of Service: Christian Brown E. 06/10/2021 9:45 A M Medical Record Number: [id number] Patient Account Number: [id number] Date of Birth/Sex: Treating RN: 1966-02-19 (56 y.o. Ernestene Mention Primary Care Cathe Bilger: [name] Other Clinician: Referring Sitlaly Gudiel: Treating Barack Nicodemus/Extender: [name] in Treatment: 10 Encounter Discharge Information Items Discharge Condition: Stable Ambulatory Status: Wheelchair Discharge Destination: Home Transportation: Private  Auto Accompanied By: self Schedule Follow-up Appointment: Yes Clinical Summary of Care: Patient Declined Electronic Signature(s) Signed: 06/10/2021 4:54:19 PM By: Baruch Gouty RN, BSN Entered By: Baruch Gouty on 06/10/2021 10:33:34 -------------------------------------------------------------------------------- Lower Extremity Assessment Details Patient Name: Date of Service: Christian Brown E. 06/10/2021 9:45 A M Medical Record Number: [id number] Patient Account Number: [id number] Date of Birth/Sex: Treating RN: 1965-06-20 (56 y.o. [name] Primary Care Rosalyn Archambault: [name] Other Clinician: Referring Chani Ghanem: Treating Isaah Furry/Extender: [name] Weeks in Treatment: 10 Electronic Signature(s) Signed: 06/10/2021 11:32:19 AM By: Sharyn Creamer RN, BSN Entered By: Sharyn Creamer on 06/10/2021 10:06:55 -------------------------------------------------------------------------------- Multi Wound Chart Details Patient Name: Date of Service: Christian Brown E. 06/10/2021 9:45 A M Medical Record Number: [id number] Patient Account Number: [id number] Date of Birth/Sex: Treating RN: [birth year] (56 y.o. M) Primary Care Xzaiver Vayda: [name] Other Clinician: Referring Kelijah Towry: Treating Durant Scibilia/Extender: [name] Weeks in Treatment: 10 Vital Signs Height(in): 74 Pulse(bpm): 112 Weight(lbs): Blood Pressure(mmHg): 145/92 Body Mass Index(BMI): Temperature(F): 98.3 Respiratory Rate(breaths/min): 18 Photos: [N/A:N/A] Midline Back N/A N/A Wound Location: Surgical Injury N/A N/A Wounding Event: Open Surgical Wound N/A N/A Primary Etiology: Hypertension, Type II Diabetes, N/A N/A Comorbid History: Osteomyelitis 01/11/2021 N/A N/A Date Acquired: 10 N/A N/A Weeks of Treatment: Open N/A N/A Wound Status: No N/A N/A Wound Recurrence: 11.3x2.7x1.1 N/A N/A Measurements L x W x D (cm) 23.962 N/A N/A A (cm) : rea 26.359 N/A N/A Volume  (cm) : 68.30% N/A N/A % Reduction in A rea: 84.80% N/A N/A % Reduction in Volume: 12 Position 1 (o'clock): 3.5 Maximum Distance 1 (cm): Yes N/A N/A Tunneling: Full Thickness Without Exposed N/A N/A Classification: Support Structures Medium N/A N/A Exudate Amount: Serosanguineous N/A N/A Exudate Type: red, brown N/A N/A Exudate Color: Large (67-100%) N/A N/A Granulation Amount: Red N/A N/A Granulation Quality: None Present (0%) N/A N/A Necrotic Amount: Fat Layer (Subcutaneous Tissue): Yes N/A N/A Exposed Structures: Fascia: No Tendon: No Muscle: No Joint: No  Bone: No Small (1-33%) N/A N/A Epithelialization: Negative Pressure Wound Therapy N/A N/A Procedures Performed: Maintenance (NPWT) Treatment Notes Electronic Signature(s) Signed: 06/10/2021 10:17:42 AM By: Kalman Shan DO Entered By: Kalman Shan on 06/10/2021 10:12:55 -------------------------------------------------------------------------------- Multi-Disciplinary Care Plan Details Patient Name: Date of Service: [name]. 06/10/2021 9:45 A M Medical Record Number: [id number] Patient Account Number: [id number] Date of Birth/Sex: Treating RN: 10/21/1965 (56 y.o. Ernestene Mention Primary Care Zyquan Crotty: [name] Other Clinician: Referring Tamey Wanek: Treating Dequane Strahan/Extender: [name] in Treatment: [place] reviewed with physician Active Inactive Wound/Skin Impairment Nursing Diagnoses: Impaired tissue integrity Goals: Patient/caregiver will verbalize understanding of skin care regimen Date Initiated: 03/28/2021 Target Resolution Date: 06/28/2021 Goal Status: Active Ulcer/skin breakdown will have a volume reduction of 30% by week 4 Date Initiated: 03/28/2021 Date Inactivated: 05/27/2021 Target Resolution Date: 05/31/2021 Goal Status: Met Interventions: Assess patient/caregiver ability to obtain necessary supplies Assess  patient/caregiver ability to perform ulcer/skin care regimen upon admission and as needed Assess ulceration(s) every visit Provide education on ulcer and skin care Treatment Activities: Topical wound management initiated : 03/28/2021 Notes: Electronic Signature(s) Signed: 06/10/2021 4:54:19 PM By: Baruch Gouty RN, BSN Entered By: Baruch Gouty on 06/10/2021 10:07:41 -------------------------------------------------------------------------------- Negative Pressure Wound Therapy Maintenance (NPWT) Details Patient Name: Date of Service: Christian, Murphy 06/10/2021 9:45 A M Medical Record Number: [id number] Patient Account Number: [id number] Date of Birth/Sex: Treating RN: 05-Jun-1965 (56 y.o. Ernestene Mention Primary Care Kemonie Cutillo: [name] Other Clinician: Referring Nike Southwell: Treating Lee-Ann Gal/Extender: [name] Weeks in Treatment: 10 NPWT Maintenance Performed for: Wound #1 Midline Back Performed By: Baruch Gouty, RN Type: VAC System Coverage Size (sq cm): 30.51 Pressure Type: Constant Pressure Setting: 125 mmHG Drain Type: None Sponge/Dressing Type: Combination : white and black Date Initiated: 03/28/2021 Dressing Removed: No Quantity of Sponges/Gauze Removed: 2 Canister Changed: No Dressing Reapplied: No Quantity of Sponges/Gauze Inserted: 2 Respones T Treatment: o good Days On NPWT : 75 Post Procedure Diagnosis Same as Pre-procedure Electronic Signature(s) Signed: 06/10/2021 4:54:19 PM By: Baruch Gouty RN, BSN Entered By: Baruch Gouty on 06/10/2021 10:11:54 -------------------------------------------------------------------------------- Pain Assessment Details Patient Name: Date of Service: Christian Brown E. 06/10/2021 9:45 A M Medical Record Number: [id number] Patient Account Number: [id number] Date of Birth/Sex: Treating RN: May 06, 1965 (56 y.o. [name] Primary Care Kayly Kriegel: [name] Other Clinician: Referring  Shawntia Mangal: Treating Georgian Mcclory/Extender: [name] Weeks in Treatment: 10 Active Problems Location of Pain Severity and Description of Pain Patient Has Paino Yes Site Locations Duration of the Pain. Duration of the Pain. Constant / Intermittento Constant Rate the pain. Current Pain Level: 7 Worst Pain Level: 9 Least Pain Level: 6 Tolerable Pain Level: 6 Character of Pain Describe the Pain: Dull, Stabbing Pain Management and Medication Current Pain Management: Medication: Yes Electronic Signature(s) Signed: 06/10/2021 11:32:19 AM By: Sharyn Creamer RN, BSN Entered By: Sharyn Creamer on 06/10/2021 10:06:48 -------------------------------------------------------------------------------- Patient/Caregiver Education Details Patient Name: Date of Service: [name] 2/27/2023andnbsp9:45 [place] Record Number: [id number] Patient Account Number: [id number] Date of Birth/Gender: Treating RN: 1965/05/11 (56 y.o. Ernestene Mention Primary Care Physician: [name] Other Clinician: Referring Physician: Treating Physician/Extender: [name] in Treatment: 10 Education Assessment Education Provided To: Patient Education Topics Provided Wound/Skin Impairment: Methods: Explain/Verbal Responses: Reinforcements needed, State content correctly [employer]) Signed: 06/10/2021 4:54:19 PM By: Baruch Gouty RN, BSN Entered By: Baruch Gouty on 06/10/2021 10:08:01 -------------------------------------------------------------------------------- Wound Assessment Details Patient Name: Date  of Service: Christian, Murphy 06/10/2021 9:45 A M Medical Record Number: [id number] Patient Account Number: [id number] Date of Birth/Sex: Treating RN: 07/21/1965 (56 y.o. [name] Primary Care Caroll Cunnington: [name] Other Clinician: Referring Nayvie Lips: Treating Martha Ellerby/Extender: [name] Weeks in Treatment: 10 Wound  Status Wound Number: 1 Primary Etiology: Open Surgical Wound Wound Location: Midline Back Wound Status: Open Wounding Event: Surgical Injury Comorbid History: Hypertension, Type II Diabetes, Osteomyelitis Date Acquired: 01/11/2021 Weeks Of Treatment: 10 Clustered Wound: No Photos Wound Measurements Length: (cm) 11.3 Width: (cm) 2.7 Depth: (cm) 1.1 Area: (cm) 23.962 Volume: (cm) 26.359 % Reduction in Area: 68.3% % Reduction in Volume: 84.8% Epithelialization: Small (1-33%) Tunneling: Yes Position (o'clock): 12 Maximum Distance: (cm) 3.5 Undermining: No Wound Description Classification: Full Thickness Without Exposed Support Structu Exudate Amount: Medium Exudate Type: Serosanguineous Exudate Color: red, brown res Foul Odor After Cleansing: No Slough/Fibrino No Wound Bed Granulation Amount: Large (67-100%) Exposed Structure Granulation Quality: Red Fascia Exposed: No Necrotic Amount: None Present (0%) Fat Layer (Subcutaneous Tissue) Exposed: Yes Tendon Exposed: No Muscle Exposed: No Joint Exposed: No Bone Exposed: No Treatment Notes Wound #1 (Back) Wound Laterality: Midline Cleanser Peri-Wound Care Topical Primary Dressing wound VAC Secondary Dressing Secured With Compression Wrap Compression Stockings Add-Ons Electronic Signature(s) Signed: 06/10/2021 11:32:19 AM By: Sharyn Creamer RN, BSN Entered By: Sharyn Creamer on 06/10/2021 10:03:27 -------------------------------------------------------------------------------- Vitals Details Patient Name: Date of Service: Christian Brown E. 06/10/2021 9:45 A M Medical Record Number: [id number] Patient Account Number: [id number] Date of Birth/Sex: Treating RN: 22-Mar-1966 (56 y.o. [name] Primary Care Juanna Pudlo: [name] Other Clinician: Referring Barre Aydelott: Treating Shuntavia Yerby/Extender: [name] Weeks in Treatment: 10 Vital Signs Time Taken: 10:00 Temperature (F): 98.3 Height (in):  74 Pulse (bpm): 112 Respiratory Rate (breaths/min): 18 Blood Pressure (mmHg): 145/92 Reference Range: 80 - 120 mg / dl Electronic Signature(s) Signed: 06/10/2021 11:32:19 AM By: Sharyn Creamer RN, BSN Entered By: Sharyn Creamer on 06/10/2021 10:06:09

## 2021-06-10 NOTE — Progress Notes (Signed)
[name] ([id number]) Visit Report for 06/10/2021 Chief Complaint Document Details Patient Name: Date of Service: [name], [name] 06/10/2021 9:45 A M Medical Record Number: [id number] Patient Account Number: [id number] Date of Birth/Sex: Treating RN: 13-Jul-1965 (56 y.o. M) Primary Care Provider: [name] Other Clinician: Referring Provider: Treating Provider/Extender: [name] Weeks in Treatment: 10 Information Obtained from: Patient Chief Complaint Back wound Electronic Signature(s) Signed: 06/10/2021 10:17:42 AM By: Kalman Shan DO Entered By: Kalman Shan on 06/10/2021 10:13:08 -------------------------------------------------------------------------------- HPI Details Patient Name: Date of Service: [name]. 06/10/2021 9:45 A M Medical Record Number: [id number] Patient Account Number: [id number] Date of Birth/Sex: Treating RN: [birth year] (56 y.o. M) Primary Care Provider: [name] Other Clinician: Referring Provider: Treating Provider/Extender: [name] Weeks in Treatment: 10 History of Present Illness HPI Description: Admission 03/28/2021 [name] is a 56 year old male with a past medical history of controlled type 2 diabetes on oral agents, obesity and OSA that presents to the clinic for a back wound. On 01/11/2021 patient had a laminectomy with PLIF of L1-S1 by Dr. Venetia Constable because of lumbar stenosis and radiculopathy. He subsequently developed bacteremia. He had CT imaging on 10/13 of the lumbar spine that showed fluid collection in the soft tissue of the posterior L1 and S1 and was taken to the OR for washout on 10/14. 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 He received 4 weeks of IV antibiotics by infectious disease. After his completion of 4 weeks of IV antibiotics he was continued for an additional 4 weeks of IV cefazolin with a stop date of 12/29. He has been evaluated by plastic  surgery and no plans for surgical intervention at this time. Wife is present and reports he has been on the wound VAC for the past 8 weeks with improvement in wound healing. He currently denies systemic signs of infection. 12/22; patient presents for follow-up. He reports no issues since last clinic visit. He denies signs of infection. He has been tolerating the wound VAC well. 12/30; patient presents for follow-up. He reports no issues and has no complaints today. He has been tolerating the wound VAC well. 1/9; patient presents for follow-up. He has no issues or complaints today. He states he feels well. He has had no problems with the wound VAC. 1/16; patient presents for follow-up. He continues to use the wound VAC with no issues. He denies signs of infection. 1/23; patient presents for follow-up. He has been switched from IV cefazolin to oral cefadroxil by infectious disease. He has no issues or complaints today. He denies signs of infection. He continues to tolerate the wound VAC well. 1/30; patient presents for follow-up. He continues to tolerate the wound VAC well. 2/6; patient presents for follow-up. He has no issues or complaints today. He continues to tolerate the wound VAC well. He denies signs of infection. 2/13; patient presents for follow-up. He continues to do well with the wound VAC. He denies any issues. 2/27; patient presents for follow-up. He continues to use the wound VAC without any issues. He denies signs of infection. Electronic Signature(s) Signed: 06/10/2021 10:17:42 AM By: Kalman Shan DO Entered By: Kalman Shan on 06/10/2021 10:13:27 -------------------------------------------------------------------------------- Physical Exam Details Patient Name: Date of Service: [name] 06/10/2021 9:45 A M Medical Record Number: [id number] Patient Account Number: [id number] Date of Birth/Sex: Treating RN: [birth year] (56 y.o. M) Primary Care Provider: [name] Other  Clinician: Referring Provider: Treating Provider/Extender:  Kalman Shan [place], [place] Weeks in Treatment: 10 Constitutional respirations regular, non-labored and within target range for patient.Marland Kitchen Psychiatric pleasant and cooperative. Notes Large open wound to the back. Granulation tissue throughout. There is undermining and tunneling. No probing to bone. No signs of infection. Electronic Signature(s) Signed: 06/10/2021 10:17:42 AM By: Kalman Shan DO Entered By: Kalman Shan on 06/10/2021 10:14:59 -------------------------------------------------------------------------------- Physician Orders Details Patient Name: Date of Service: [name]. 06/10/2021 9:45 A M Medical Record Number: [id number] Patient Account Number: [id number] Date of Birth/Sex: Treating RN: May 16, 1965 (56 y.o. Ernestene Mention Primary Care Provider: [name] Other Clinician: Referring Provider: Treating Provider/Extender: [name] Weeks in Treatment: 10 Verbal / Phone Orders: No Diagnosis Coding Follow-up Appointments ppointment in 2 weeks. - Dr. Heber [place] Monday Return A Nurse Visit: - Thursday 3/2, Monday 3/6, Thurs 3/9 Bathing/ Shower/ Hygiene May shower with protection but do not get wound dressing(s) wet. Negative Presssure Wound Therapy Wound Vac to wound continuously at 166mm/hg pressure Black Foam - wound base then bridge to right side White Foam - T tunnel at 12:00 o Additional Orders / Instructions Follow Nutritious Diet - Continue to monitor blood sugars daily Wound Treatment Wound #1 - Back Wound Laterality: Midline Prim Dressing: wound VAC ary 2 x Per Week/30 Days Electronic Signature(s) Signed: 06/10/2021 10:17:42 AM By: Kalman Shan DO Entered By: Kalman Shan on 06/10/2021 10:15:54 -------------------------------------------------------------------------------- Problem List Details Patient Name: Date of Service: [name]. 06/10/2021 9:45 A  M Medical Record Number: [id number] Patient Account Number: [id number] Date of Birth/Sex: Treating RN: 1965-09-20 (56 y.o. 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 06/10/2021 9:45 A M Medical Record Number: [id number] Patient Account Number: [id number] Date of Birth/Sex: Treating RN: 11/11/1965 (56 y.o. M) Primary Care Provider: [name] Other Clinician: Referring Provider: Treating Provider/Extender: [name] Weeks in Treatment: 10 Subjective Chief Complaint Information obtained from Patient Back wound History of Present Illness (HPI) Admission 03/28/2021 [name] is a 56 year old male with a past medical history of controlled type 2 diabetes on oral agents, obesity and OSA that presents to the clinic for a back wound. On 01/11/2021 patient had a laminectomy with PLIF of L1-S1 by Dr. Venetia Constable because of lumbar stenosis and radiculopathy. He subsequently developed bacteremia. He had CT imaging on 10/13 of the lumbar spine that showed fluid collection in the soft tissue of the posterior L1 and S1 and was taken to the OR for washout on 10/14. 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 He received 4 weeks of IV antibiotics by  infectious disease. After his completion of 4 weeks of IV antibiotics he was continued for an additional 4 weeks of IV cefazolin with a stop date of 12/29. He has been evaluated by plastic surgery and no plans for surgical intervention at this time. Wife is present and reports he has been on the wound VAC for the past 8 weeks with improvement in wound healing.  He currently denies systemic signs of infection. 12/22; patient presents for follow-up. He reports no issues since last clinic visit. He denies signs of infection. He has been tolerating the wound VAC well. 12/30; patient presents for follow-up. He reports no issues and has no complaints today. He has been tolerating the wound VAC well. 1/9; patient presents for follow-up. He has no issues or complaints today. He states he feels well. He has had no problems with the wound VAC. 1/16; patient presents for follow-up. He continues to use the wound VAC with no issues. He denies signs of infection. 1/23; patient presents for follow-up. He has been switched from IV cefazolin to oral cefadroxil by infectious disease. He has no issues or complaints today. He denies signs of infection. He continues to tolerate the wound VAC well. 1/30; patient presents for follow-up. He continues to tolerate the wound VAC well. 2/6; patient presents for follow-up. He has no issues or complaints today. He continues to tolerate the wound VAC well. He denies signs of infection. 2/13; patient presents for follow-up. He continues to do well with the wound VAC. He denies any issues. 2/27; patient presents for follow-up. He continues to use the wound VAC without any issues. He denies signs of infection. Patient History Information obtained from Patient. Family History Diabetes - Mother, Stroke - Siblings, No family history of Cancer, Heart Disease, Hereditary Spherocytosis, Hypertension, Kidney Disease, Lung Disease, Seizures, Thyroid Problems, Tuberculosis. Social  History Never smoker, Marital Status - Married, Alcohol Use - Rarely, Drug Use - No History, Caffeine Use - Rarely. Medical History Cardiovascular Patient has history of Hypertension Endocrine Patient has history of Type II Diabetes Musculoskeletal Patient has history of Osteomyelitis Medical A Surgical History Notes nd Musculoskeletal DDD Objective Constitutional respirations regular, non-labored and within target range for patient.. Vitals Time Taken: 10:00 AM, Height: 74 in, Temperature: 98.3 F, Pulse: 112 bpm, Respiratory Rate: 18 breaths/min, Blood Pressure: 145/92 mmHg. Psychiatric pleasant and cooperative. General Notes: Large open wound to the back. Granulation tissue throughout. There is undermining and tunneling. No probing to bone. No signs of infection. Integumentary (Hair, Skin) Wound #1 status is Open. Original cause of wound was Surgical Injury. The date acquired was: 01/11/2021. The wound has been in treatment 10 weeks. The wound is located on the Midline Back. The wound measures 11.3cm length x 2.7cm width x 1.1cm depth; 23.962cm^2 area and 26.359cm^3 volume. There is Fat Layer (Subcutaneous Tissue) exposed. There is no undermining noted, however, there is tunneling at 12:00 with a maximum distance of 3.5cm. There is a medium amount of serosanguineous drainage noted. There is large (67-100%) red granulation within the wound bed. There is no necrotic tissue within the wound bed. 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 I recommended continuing the wound VAC. No signs of infection or concerning features today. Plan Follow-up Appointments: Return Appointment in 2 weeks. - Dr. Heber [place] Monday Nurse Visit: - Thursday 3/2, Monday 3/6, Thurs 3/9 Bathing/ Shower/ Hygiene: May  shower with protection but do not get wound dressing(s) wet. Negative Presssure Wound Therapy: Wound Vac to wound continuously at 143mm/hg pressure Black Foam - wound base then bridge to right side White Foam - T tunnel at 12:00 o Additional Orders / Instructions: Follow Nutritious Diet - Continue to monitor blood sugars daily WOUND #1: - Back Wound Laterality: Midline Prim Dressing:  wound VAC 2 x Per Week/30 Days ary 1. Continue wound VAC 2. Follow-up in 2 weeks, continue nurse visits for [facility] change in our clinic Electronic Signature(s) Signed: 06/10/2021 10:17:42 AM By: Kalman Shan DO Entered By: Kalman Shan on 06/10/2021 10:16:34 -------------------------------------------------------------------------------- HxROS Details Patient Name: Date of Service: Christian Brown E. 06/10/2021 9:45 A M Medical Record Number: [id number] Patient Account Number: [id number] Date of Birth/Sex: Treating RN: 04-03-1966 (56 y.o. M) Primary Care Provider: [name] Other Clinician: Referring Provider: Treating Provider/Extender: [name] Weeks in Treatment: 10 Information Obtained From Patient Cardiovascular Medical History: Positive for: Hypertension Endocrine Medical History: Positive for: Type II Diabetes Time with diabetes: Since mid 90's Treated with: Oral agents Blood sugar tested every day: Yes Tested : 2x day Musculoskeletal Medical History: Positive for: Osteomyelitis Past Medical History Notes: DDD Immunizations Pneumococcal Vaccine: Received Pneumococcal Vaccination: Yes Received Pneumococcal Vaccination On or After 60th Birthday: No Implantable Devices Yes Family and Social History Cancer: No; Diabetes: Yes - Mother; Heart Disease: No; Hereditary Spherocytosis: No; Hypertension: No; Kidney Disease: No; Lung Disease: No; Seizures: No; Stroke: Yes - Siblings; Thyroid Problems: No; Tuberculosis: No; Never smoker; Marital Status - Married; Alcohol Use:  Rarely; Drug Use: No History; Caffeine Use: Rarely; Financial Concerns: No; Food, Clothing or Shelter Needs: No; Support System Lacking: No; Transportation Concerns: No Electronic Signature(s) Signed: 06/10/2021 10:17:42 AM By: Kalman Shan DO Entered By: Kalman Shan on 06/10/2021 10:13:35 -------------------------------------------------------------------------------- SuperBill Details Patient Name: Date of Service: [name] 06/10/2021 Medical Record Number: [id number] Patient Account Number: [id number] Date of Birth/Sex: Treating RN: Jul 24, 1965 (56 y.o. 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

## 2021-06-12 ENCOUNTER — Other Ambulatory Visit (HOSPITAL_COMMUNITY): Payer: Self-pay

## 2021-06-12 NOTE — Progress Notes (Signed)
DESTON, BILYEU ([id number]) Visit Report for 06/06/2021 Arrival Information Details Patient Name: Date of Service: [name], [name] 06/06/2021 11:15 A M Medical Record Number: [id number] Patient Account Number: [id number] Date of Birth/Sex: Treating RN: 06-14-1965 (56 y.o. [name], [name] Primary Care Jaeshaun Riva: [name] Other Clinician: Donavan Burnet Referring Nohealani Medinger: Treating Chele Cornell/Extender: [name] in Treatment: 10 Visit Information History Since Last Visit All ordered tests and consults were completed: Yes Patient Arrived: Wheel Chair Added or deleted any medications: No Arrival Time: 11:32 Any new allergies or adverse reactions: No Accompanied By: brother Had a fall or experienced change in No Transfer Assistance: None activities of daily living that may affect Patient Identification Verified: Yes risk of falls: Secondary Verification Process Completed: Yes Signs or symptoms of abuse/neglect since last visito No Patient Requires Transmission-Based Precautions: No Hospitalized since last visit: No Patient Has Alerts: Yes Implantable device outside of the clinic excluding No Patient Alerts: Patient on Blood Thinner cellular tissue based products placed in the center No BP Right Arm-PICC since last visit: Pain Present Now: No Electronic Signature(s) Signed: 06/12/2021 3:37:08 PM By: [name] RN Entered By: [name] on 06/06/2021 11:33:10 -------------------------------------------------------------------------------- Encounter Discharge Information Details Patient Name: Date of Service: Christian Brown E. 06/06/2021 11:15 A M Medical Record Number: [id number] Patient Account Number: [id number] Date of Birth/Sex: Treating RN: 03/04/1966 (56 y.o. [name] Primary Care Tanishi Nault: [name] Other Clinician: Referring Aubry Tucholski: Treating Margit Batte/Extender: [name] Weeks in Treatment: 10 Encounter  Discharge Information Items Discharge Condition: Stable Ambulatory Status: Wheelchair Discharge Destination: Home Transportation: Private Auto Accompanied By: family member Schedule Follow-up Appointment: Yes Clinical Summary of Care: Electronic Signature(s) Signed: 06/06/2021 5:46:52 PM By: Deon Pilling RN, BSN Entered By: Deon Pilling on 06/06/2021 12:15:54 -------------------------------------------------------------------------------- Negative Pressure Wound Therapy Maintenance (NPWT) Details Patient Name: Date of Service: [name], [name] 06/06/2021 11:15 A M Medical Record Number: [id number] Patient Account Number: [id number] Date of Birth/Sex: Treating RN: 06-01-1965 (56 y.o. [name] Primary Care Levon Penning: [name] Other Clinician: Referring Domingue Coltrain: Treating Aceyn Kathol/Extender: [name] Weeks in Treatment: 10 NPWT Maintenance Performed for: Wound #1 Midline Back Performed By: Deon Pilling, RN Type: VAC System Coverage Size (sq cm): 42 Pressure Type: Constant Pressure Setting: 125 mmHG Drain Type: None Primary Contact: Non-Adherent Sponge/Dressing Type: Combination : x1 white foam and x1 black foam Date Initiated: 03/28/2021 Dressing Removed: Yes Quantity of Sponges/Gauze Removed: x3 Canister Changed: Yes Canister Exudate Volume: 75 Dressing Reapplied: Yes Quantity of Sponges/Gauze Inserted: x1 white foam; x1 black foam bridged to right side Respones T Treatment: o tolerated well Days On NPWT : 71 Electronic Signature(s) Signed: 06/06/2021 5:46:52 PM By: Deon Pilling RN, BSN Entered By: Deon Pilling on 06/06/2021 12:10:16 -------------------------------------------------------------------------------- Patient/Caregiver Education Details Patient Name: Date of Service: [name] 2/23/2023andnbsp11:15 [place] Record Number: [id number] Patient Account Number: [id number] Date of Birth/Gender: Treating RN: [birth year] (56  y.o. [name] Primary Care Physician: [name] Other Clinician: Referring Physician: Treating Physician/Extender: [name] in Treatment: 10 Education Assessment Education Provided To: Patient Education Topics Provided Wound/Skin Impairment: Handouts: Skin Care Do's and Dont's Methods: Explain/Verbal Responses: Reinforcements needed Electronic Signature(s) Signed: 06/06/2021 5:46:52 PM By: Deon Pilling RN, BSN Entered By: Deon Pilling on 06/06/2021 12:15:40 -------------------------------------------------------------------------------- Wound Assessment Details Patient Name: Date of Service: Christian Brown E. 06/06/2021 11:15 A M Medical Record Number: [id number] Patient Account Number: [id number] Date of Birth/Sex: Treating RN:  1966/04/10 (56 y.o. [name] Primary Care Lulamae Skorupski: [name] Other Clinician: Referring Stamatia Masri: Treating Gailene Youkhana/Extender: [name] Weeks in Treatment: 10 Wound Status Wound Number: 1 Primary Etiology: Open Surgical Wound Wound Location: Midline Back Wound Status: Open Wounding Event: Surgical Injury Date Acquired: 01/11/2021 Weeks Of Treatment: 10 Clustered Wound: No Wound Measurements Length: (cm) 12 Width: (cm) 3.5 Depth: (cm) 2.2 Area: (cm) 32.987 Volume: (cm) 72.571 % Reduction in Area: 56.4% % Reduction in Volume: 58.3% Wound Description Classification: Full Thickness Without Exposed Support Structu Exudate Amount: Medium Exudate Type: Serosanguineous Exudate Color: red, brown res Electronic Signature(s) Signed: 06/06/2021 5:46:52 PM By: Deon Pilling RN, BSN Entered By: Deon Pilling on 06/06/2021 12:07:48 -------------------------------------------------------------------------------- Vitals Details Patient Name: Date of Service: Christian Brown E. 06/06/2021 11:15 A M Medical Record Number: [id number] Patient Account Number: [id number] Date of Birth/Sex: Treating  RN: 1965/11/29 (56 y.o. [name], [name] Primary Care Talena Neira: [name] Other Clinician: Referring Khristen Cheyney: Treating Jeriah Corkum/Extender: [name] Weeks in Treatment: 10 Vital Signs Time Taken: 11:41 Temperature (F): 97.9 Height (in): 74 Pulse (bpm): 116 Respiratory Rate (breaths/min): 18 Blood Pressure (mmHg): 121/74 Reference Range: 80 - 120 mg / dl Electronic Signature(s) Signed: 06/12/2021 3:37:08 PM By: [name] RN Entered By: [name] on 06/06/2021 11:41:28

## 2021-06-13 ENCOUNTER — Encounter (HOSPITAL_BASED_OUTPATIENT_CLINIC_OR_DEPARTMENT_OTHER): Payer: No Typology Code available for payment source | Attending: Internal Medicine | Admitting: Internal Medicine

## 2021-06-13 ENCOUNTER — Other Ambulatory Visit: Payer: Self-pay

## 2021-06-13 ENCOUNTER — Encounter: Payer: Self-pay | Admitting: Internal Medicine

## 2021-06-13 ENCOUNTER — Ambulatory Visit: Payer: No Typology Code available for payment source | Attending: Physical Medicine and Rehabilitation

## 2021-06-13 DIAGNOSIS — L98424 Non-pressure chronic ulcer of back with necrosis of bone: Secondary | ICD-10-CM | POA: Insufficient documentation

## 2021-06-13 DIAGNOSIS — M545 Low back pain, unspecified: Secondary | ICD-10-CM | POA: Diagnosis not present

## 2021-06-13 DIAGNOSIS — M869 Osteomyelitis, unspecified: Secondary | ICD-10-CM | POA: Diagnosis not present

## 2021-06-13 DIAGNOSIS — G4733 Obstructive sleep apnea (adult) (pediatric): Secondary | ICD-10-CM | POA: Insufficient documentation

## 2021-06-13 DIAGNOSIS — M6281 Muscle weakness (generalized): Secondary | ICD-10-CM | POA: Diagnosis present

## 2021-06-13 DIAGNOSIS — E11622 Type 2 diabetes mellitus with other skin ulcer: Secondary | ICD-10-CM | POA: Diagnosis not present

## 2021-06-13 DIAGNOSIS — G8929 Other chronic pain: Secondary | ICD-10-CM | POA: Insufficient documentation

## 2021-06-13 DIAGNOSIS — E1169 Type 2 diabetes mellitus with other specified complication: Secondary | ICD-10-CM | POA: Insufficient documentation

## 2021-06-13 DIAGNOSIS — R2689 Other abnormalities of gait and mobility: Secondary | ICD-10-CM | POA: Diagnosis present

## 2021-06-13 DIAGNOSIS — E669 Obesity, unspecified: Secondary | ICD-10-CM | POA: Diagnosis not present

## 2021-06-13 NOTE — Progress Notes (Signed)
[name], JU ([id number]) ?Visit Report for 06/13/2021 ?Arrival Information Details ?Patient Name: Date of Service: ?[name], Christian [name]. 06/13/2021 9:45 A M ?Medical Record Number: [id number] ?Patient Account Number: [id number] ?Date of Birth/Sex: Treating RN: ?05/03/1965 (56 y.o. M) [name], [name] ?Primary Care [name]: [name] Other Clinician: ?Referring [name]: ?Treating [name]/Extender: [name] ?[name], [place] ?Weeks in Treatment: 11 ?Visit Information History Since Last Visit ?Added or deleted any medications: No ?Patient Arrived: Wheel Chair ?Any new allergies or adverse reactions: No ?Arrival Time: 10:13 ?Had a fall or experienced change in No ?Accompanied By: family member ?activities of daily living that may affect ?Transfer Assistance: Manual ?risk of falls: ?Patient Identification Verified: Yes ?Signs or symptoms of abuse/neglect since last visito No ?Secondary Verification Process Completed: Yes ?Hospitalized since last visit: No ?Patient Requires Transmission-Based Precautions: No ?Implantable device outside of the clinic excluding No ?Patient Has Alerts: Yes ?cellular tissue based products placed in the center ?Patient Alerts: Patient on Blood Thinner since last visit: ?No BP Right Arm-PICC Has Dressing in Place as Prescribed: Yes ?Pain Present Now: Yes ?Electronic Signature(s) ?Signed: 06/13/2021 5:21:31 PM By: Deon Pilling RN, BSN ?Entered By: Deon Pilling on 06/13/2021 10:35:55 ?-------------------------------------------------------------------------------- ?Encounter Discharge Information Details ?Patient Name: Date of Service: ?[name], Christian [name]. 06/13/2021 9:45 A M ?Medical Record Number: [id number] ?Patient Account Number: [id number] ?Date of Birth/Sex: Treating RN: ?October 21, 1965 (56 y.o. M) [name], [name] ?Primary Care Shiree Altemus: [name] Other Clinician: ?Referring [name]: ?Treating [name]/Extender: [name] ?[name], [name] ?Weeks in Treatment: 11 ?Encounter Discharge Information Items ?Discharge  Condition: Stable ?Ambulatory Status: Wheelchair ?Discharge Destination: Home ?Transportation: Private Auto ?Accompanied By: self ?Schedule Follow-up Appointment: Yes ?Clinical Summary of Care: ?Electronic Signature(s) ?Signed: 06/13/2021 5:21:31 PM By: Deon Pilling RN, BSN ?Entered By: Deon Pilling on 06/13/2021 10:39:03 ?-------------------------------------------------------------------------------- ?Negative Pressure Wound Therapy Maintenance (NPWT) Details ?Patient Name: ?Date of Service: ?[name], Christian [name]. 06/13/2021 9:45 A M ?Medical Record Number: [id number] ?Patient Account Number: [id number] ?Date of Birth/Sex: ?Treating RN: ?[birth year] (56 y.o. M) [name], [name] ?Primary Care [name]: [name] ?Other Clinician: ?Referring [name]: ?Treating [name]/Extender: [name] ?[name], [place] ?Weeks in Treatment: 11 ?NPWT Maintenance Performed for: Wound #1 Midline Back ?Performed By: Deon Pilling, RN ?Type: VAC System ?Coverage Size (sq cm): 30.51 ?Pressure Type: Constant ?Pressure Setting: 125 mmHG ?Drain Type: None ?Sponge/Dressing Type: ?Combination : x1 white foam and x1 black foam ?Date Initiated: 03/28/2021 ?Dressing Removed: Yes ?Quantity of Sponges/Gauze Removed: x1 white foam and x1 black foam ?Canister Changed: Yes ?Canister Exudate Volume: 50 ?Dressing Reapplied: Yes ?Quantity of Sponges/Gauze Inserted: x1 white foam; x1 black foam bridged to right side ?Respones T Treatment: ?o tolerated well ?Days On NPWT : 78 ?Electronic Signature(s) ?Signed: 06/13/2021 5:21:31 PM By: Deon Pilling RN, BSN ?Entered By: Deon Pilling on 06/13/2021 10:37:11 ?-------------------------------------------------------------------------------- ?Patient/Caregiver Education Details ?Patient Name: ?Date of Service: ?[name], Christian [name]. 3/2/2023andnbsp9:45 A M ?Medical Record Number: [id number] ?Patient Account Number: [id number] ?Date of Birth/Gender: ?Treating RN: ?1965-09-08 (56 y.o. M) [name], [name] ?Primary Care Physician: [name] ?Other Clinician: ?Referring Physician: ?Treating Physician/Extender: [name] ?[name], [name] ?Weeks in Treatment: 11 ?Education Assessment ?Education Provided To: ?Patient ?Education Topics Provided ?Wound/Skin Impairment: ?Handouts: Skin Care Do's and Dont's ?Methods: Explain/Verbal ?Responses: Reinforcements needed ?Electronic Signature(s) ?Signed: 06/13/2021 5:21:31 PM By: Deon Pilling RN, BSN ?Entered By: Deon Pilling on 06/13/2021 10:38:53 ?-------------------------------------------------------------------------------- ?Wound Assessment Details ?Patient Name: ?Date of Service: ?[name], Christian [name]. 06/13/2021 9:45 A M ?Medical Record Number: [id number] ?Patient Account Number: [id number] ?Date of Birth/Sex: ?Treating  RN: ?[birth year] (56 y.o. M) [name], [name] ?Primary Care [name]: [name] ?Other Clinician: ?Referring [name]: ?Treating [name]/Extender: [name] ?[name], [name] ?Weeks in Treatment: 11 ?Wound Status ?Wound Number: 1 ?Primary Etiology: Open Surgical Wound ?Wound Location: Midline Back ?Wound Status: Open ?Wounding Event: Surgical Injury ?Date Acquired: 01/11/2021 ?Weeks Of Treatment: 11 ?Clustered Wound: No ?Wound Measurements ?Length: (cm) 11.3 ?Width: (cm) 2.7 ?Depth: (cm) 1.1 ?Area: (cm?) 23.962 ?Volume: (cm?) 26.359 ?% Reduction in Area: 68.3% ?% Reduction in Volume: 84.8% ?Wound Description ?Classification: Full Thickness Without Exposed Support Structu ?Exudate Amount: Medium ?Exudate Type: Serosanguineous ?Exudate Color: red, brown ?res ?Treatment Notes ?Wound #1 (Back) Wound Laterality: Midline ?Cleanser ?Peri-Wound Care ?Topical ?Primary Dressing ?wound VAC ?Secondary Dressing ?Secured With ?Compression Wrap ?Compression Stockings ?Add-Ons ?Electronic Signature(s) ?Signed: 06/13/2021 5:21:31 PM By: Deon Pilling RN, BSN ?Entered By: Deon Pilling on 06/13/2021 10:36:26 ?-------------------------------------------------------------------------------- ?Vitals Details ?Patient  Name: ?Date of Service: ?[name], Christian [name]. 06/13/2021 9:45 A M ?Medical Record Number: [id number] ?Patient Account Number: [id number] ?Date of Birth/Sex: ?Treating RN: ?06-22-1965 (56 y.o. M) [name], [name] ?Primary Care Scottlyn Mchaney: [name] ?Other Clinician: ?Referring Salmaan Patchin: ?Treating Jordynn Marcella/Extender: [name] ?[name], [place] ?Weeks in Treatment: 11 ?Vital Signs ?Time Taken: 10:13 ?Temperature (??F): 97.9 ?Pulse (bpm): 109 ?Respiratory Rate (breaths/min): 20 ?Blood Pressure (mmHg): 118/82 ?Reference Range: 80 - 120 mg / dl ?Electronic Signature(s) ?Signed: 06/13/2021 5:21:31 PM By: Deon Pilling RN, BSN ?Entered By: Deon Pilling on 06/13/2021 10:36:15 ?

## 2021-06-13 NOTE — Progress Notes (Signed)
EVERHETT, BOZARD ([id number]) ?Visit Report for 06/13/2021 ?SuperBill Details ?Patient Name: Date of Service: ?[place], [place]. 06/13/2021 ?Medical Record Number: [id number] ?Patient Account Number: [id number] ?Date of Birth/Sex: Treating RN: ?Dec 22, 1965 (57 y.o. 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

## 2021-06-13 NOTE — Therapy (Signed)
OUTPATIENT PHYSICAL THERAPY TREATMENT NOTE   Patient Name: [name] MRN: [id number] DOB:Jul 26, 1965, 56 y.o., male Today's Date: 06/13/2021  PCP: Colon Branch, MD REFERRING PROVIDER: Flora Lipps   PT End of Session - 06/13/21 1402     Visit Number 3    Number of Visits 25    Date for PT Re-Evaluation 08/31/21    Authorization Type [facility]    PT Start Time 1402    PT Stop Time 0488    PT Time Calculation (min) 40 min    Equipment Utilized During Treatment Gait belt;Other (comment)   RW; WC   Activity Tolerance Patient tolerated treatment well    Behavior During Therapy WFL for tasks assessed/performed              Past Medical History:  Diagnosis Date   DDD (degenerative disc disease), lumbar    Diabetes mellitus    Hyperlipemia    Hypertension    Obesity    OSA (obstructive sleep apnea)    on CPAP   Past Surgical History:  Procedure Laterality Date   APPLICATION OF ROBOTIC ASSISTANCE FOR SPINAL PROCEDURE N/A 01/11/2021   Procedure: APPLICATION OF ROBOTIC ASSISTANCE FOR SPINAL PROCEDURE;  Surgeon: Judith Part, MD;  Location: [place];  Service: Neurosurgery;  Laterality: N/A;   APPLICATION OF WOUND VAC N/A 01/25/2021   Procedure: APPLICATION OF WOUND VAC;  Surgeon: Dawley, Theodoro Doing, DO;  Location: [place] [facility];  Service: Neurosurgery;  Laterality: N/A;   BACK SURGERY     COLONOSCOPY  2019   LUMBAR WOUND DEBRIDEMENT N/A 01/25/2021   Procedure: Irrigation and Debridement of lumbar wound, and wound vacuum assisted closure;  Surgeon: Dawley, Theodoro Doing, DO;  Location: [facility];  Service: Neurosurgery;  Laterality: N/A;   TRANSFORAMINAL LUMBAR INTERBODY FUSION (TLIF) WITH PEDICLE SCREW FIXATION 4 LEVEL N/A 01/11/2021   Procedure: Lumbar one-two, Lumbar two-three, Lumbar three-four, Lumbar four-five, Lumbar five Sacral one Open decompression, Transforaminal lumbar interbody fusion, posterolateral instrumented fusion;  Surgeon: Judith Part, MD;  Location: [place];   Service: Neurosurgery;  Laterality: N/A;   Patient Active Problem List   Diagnosis Date Noted   Loosening of hardware in spine ([place]) 89/16/9450   Hardware complicating wound infection ([place]) 03/13/2021   Medication monitoring encounter 03/13/2021   Slow transit constipation    Disto-occlusion    Lumbar discitis    Epidural abscess    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[facility])    Hypoalbuminemia due to protein-calorie malnutrition ([facility])    Acute blood loss anemia    Other chronic postprocedural pain    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   Klebsiella pneumoniae infection 01/28/2021   Wound infection after surgery 01/25/2021   Muscle spasms of both lower extremities 01/25/2021   Abdominal pain 01/25/2021   Spleen enlarged 01/25/2021   Lumbar radiculopathy 01/11/2021   Spasticity 12/03/2020   Neuropathy 09/28/2020   Chronic pain syndrome 09/28/2020   Intervertebral lumbar disc disorder with myelopathy, lumbar region 09/28/2020   Spondylosis, cervical, with myelopathy 09/28/2020   Unilateral primary osteoarthritis, right knee 10/28/2019   PCP NOTES >>>>>>>> 07/09/2015   DJD (degenerative joint disease) 10/27/2014   Screening for heart disease 06/28/2012   OSA (obstructive sleep apnea) 06/14/2012   ED (erectile dysfunction) 04/26/2012   Annual physical exam 12/29/2011   Achilles tendinitis 09/10/2010   Allergic rhinitis 07/12/2010   Hyperlipidemia 04/26/2007   DM II (diabetes mellitus, type II), controlled ([place]) 04/22/2006   OBESITY NOS 04/22/2006  Essential hypertension 04/22/2006    REFERRING DIAG:  M51.06 (ICD-10-CM) - Lumbar disc herniation with myelopathy   THERAPY DIAG:  Chronic bilateral low back pain, unspecified whether sciatica present  Muscle weakness (generalized)  PERTINENT HISTORY:  s/p L1-S1 decompression and instrumented fusion 01/11/21 Inpatient rehab: 01/18/21- 03/08/21 Incision and debridement of lumbar wound, placement of wound VAC 01/25/21 Wound care visits  occurring on Monday and Thursday  PRECAUTIONS: Wound and Falls  SUBJECTIVE:  Pt presents to PT with reports of LBP. Had LE muscle soreness after last session, but this dissipated after 24 hours. Is ready to begin PT at this time.   Pain Assessment: Are you having pain? Yes NPRS scale: 6/10 Pain location: low back  PAIN TYPE: stabbing, dull  Pain description: constant  Aggravating factors: prolonged sitting  Relieving factors: laying supine     OBJECTIVE:    LE MMT:   MMT Right 06/05/2021 Left 06/05/2021  Hip flexion 3- 3-  Hip extension      Hip abduction      Hip adduction      Hip internal rotation      Hip external rotation      Knee flexion 4 4  Knee extension 4+ 4+  Ankle dorsiflexion 5 5  Ankle plantarflexion 4 4  Ankle inversion      Ankle eversion       (Blank rows = not tested)   FUNCTIONAL TESTS:            5 x STS: 17 seconds use of BUE            TUG: 25 seconds RW            2 MWT: 284 ft CGA   BERG BALANCE TEST Sitting to Standing: 3.      Stands independently using hands Standing Unsupported: 4.      Stands safely for 2 minutes Sitting Unsupported: 4.     Sits for 2 minutes independently Standing to Sitting: 4.     Sits safely with minimal use of hands Transfers: 3.     Transfers safely definite use of hands Standing with eyes closed: 3.     Stands 10 seconds with supervision Standing with feet together: 3.     Stands for 1 minute with supervision Reaching forward with outstretched arm: 1.     Reaches forward with supervision Retrieving object from the floor: 3.     Able to pick up with supervision Turning to look behind: 2.     Turns sideways only, maintains balance Turning 360 degrees: 2.     Able to turn slowly, but safely Place alternate foot on stool: 0.     Unable, needs assist to keep from falling Standing with one foot in front: 2.     Independent small step for 30 seconds Standing on one foot: 0.     Unable   Total Score: 34/56     GAIT: Distance walked: 284 ft  Assistive device utilized: Walker - 2 wheeled Level of assistance: CGA Comments: flexed posture, foot flat initial contact, limited push-off, WBOS       TODAY'S TREATMENT  [facility] Adult PT Treatment:                                                DATE: 06/13/2021 Therapeutic Exercise: NuStep lvl 5  UE/LE x 4 min while taking subjective LAQ 2x10 4# Seated hamstring curl 3x10 BTB Sit to Stand 3x5  Seated clamshell 3x20 Black TB Seated horizontal abd 2x15 GTB Seated row 3x12 BTB  Gait Training: Amb with FWW 357ft x 2 with seated rest break in between Final amb with 225ft FWW   [facility] Adult PT Treatment:                                                DATE: 06/10/2021 Therapeutic Exercise: LAQ 3x10 3# Seated march 2x20 4# Seated hamstring curl 2x10 GTB Sit to Stand 3x5  Seated clamshell 3x15 Sit to Stand 2x20 black TB Standing hip abd 2x10 in // Standing hip ext x 10 each in // Seated ball squeeze 2x10 - 5" hold Seated horizontal abd 2x15 GTB Seated row 2x10 GTB    [facility] Adult PT Treatment:                                                DATE: 06/04/2021 Therapeutic Exercise: Demonstrated and issued initial HEP.    Therapeutic Activity: Education on assessment findings that will be addressed throughout duration of POC.       PATIENT EDUCATION:  Education details: see treatment Person educated: Patient; family member  Education method: Explanation, Demonstration, Tactile cues, Verbal cues, and Handouts Education comprehension: verbalized understanding, returned demonstration, verbal cues required, tactile cues required, and needs further education     HOME EXERCISE PROGRAM: Access Code: [place] URL: [email or web address] Date: 06/05/2021 Prepared by: Gwendolyn Grant   Exercises Seated March - 2 x daily - 7 x weekly - 3 sets - 10 reps Seated Hip Abduction with Resistance - 2 x daily - 7 x weekly - 3 sets - 10 reps Seated Long Arc  Quad - 2 x daily - 7 x weekly - 3 sets - 10 reps Seated Heel Raise - 2 x daily - 7 x weekly - 3 sets - 10 reps Standing Romberg to 1/2 Tandem Stance - 1 x daily - 7 x weekly - 3 sets - 30 sec hold     ASSESSMENT:   CLINICAL IMPRESSION: Pt was able to complete all prescribed exercises with no adverse effect or increase in pain. Therapy today continued to focus on improving functional activity tolerance, strength, and gait. He continues to benefit from skilled PT services in order to improving functional ability and endurance. Will continue to progress as able per POC.      OBJECTIVE IMPAIRMENTS Abnormal gait, decreased activity tolerance, decreased balance, decreased endurance, decreased mobility, difficulty walking, decreased ROM, decreased strength, impaired flexibility, improper body mechanics, postural dysfunction, obesity, and pain.    ACTIVITY LIMITATIONS cleaning, community activity, driving, meal prep, occupation, laundry, shopping, and yard work.    PERSONAL FACTORS Age, Fitness, and Time since onset of injury/illness/exacerbation are also affecting patient's functional outcome.      GOALS: Goals reviewed with patient? No   SHORT TERM GOALS:   STG Name Target Date Goal status  1 Patient will be independent and compliant with initial HEP.    Baseline:  06/26/2021 INITIAL  2 Patient will be able to complete sit <> stand transfer without use of UE support.  Baseline:  07/17/2021  INITIAL  3 Patient will complete TUG in <15 seconds with LRAD to reduce his risk of future falls.  Baseline: 07/17/2021 INITIAL  4 Patient will tolerate at least 15 minutes of continuous standing activity without LOB.  Baseline: frequent rest breaks during BERG with LOB  07/17/2021 INITIAL    LONG TERM GOALS:    LTG Name Target Date Goal status  1 Patient will be modified independent with stair negotiation.  Baseline: 08/28/2021 INITIAL  2 Patient will be modified independent with floor transfer Baseline:  per patient required 4 person assist to transfer when he had his fall.  08/28/2021 INITIAL  3 Patient will score at least 45/56 on the BERG to signify improvements in balance and reduction in fall risk.  Baseline: 08/28/2021 INITIAL  4 Patient will be tolerate at least 10 minutes of walking with LRAD to improve his independence with household and community ambulation.  Baseline: SBA with RW in house; manual W/C community ambulation 08/28/2021 INITIAL  5 Patient will score at least 40% function on FOTO to signify clinically meaningful improvement in function.  08/28/21 Initial     PLAN: PT FREQUENCY: 2x/week   PT DURATION: 12 weeks   PLANNED INTERVENTIONS: Therapeutic exercises, Therapeutic activity, Neuro Muscular re-education, Balance training, Gait training, Patient/Family education, Stair training, Wheelchair mobility training, Cryotherapy, Moist heat, and Manual therapy   PLAN FOR NEXT SESSION: review HEP, standing strengthening as tolerated, balance, hip strengthening.     Ward Chatters, PT 06/13/2021, 2:45 PM

## 2021-06-17 ENCOUNTER — Encounter (HOSPITAL_BASED_OUTPATIENT_CLINIC_OR_DEPARTMENT_OTHER): Payer: No Typology Code available for payment source | Admitting: Internal Medicine

## 2021-06-17 ENCOUNTER — Ambulatory Visit: Payer: No Typology Code available for payment source

## 2021-06-17 ENCOUNTER — Other Ambulatory Visit: Payer: Self-pay

## 2021-06-17 DIAGNOSIS — R2689 Other abnormalities of gait and mobility: Secondary | ICD-10-CM

## 2021-06-17 DIAGNOSIS — E11622 Type 2 diabetes mellitus with other skin ulcer: Secondary | ICD-10-CM | POA: Diagnosis not present

## 2021-06-17 DIAGNOSIS — M545 Low back pain, unspecified: Secondary | ICD-10-CM

## 2021-06-17 DIAGNOSIS — G8929 Other chronic pain: Secondary | ICD-10-CM

## 2021-06-17 DIAGNOSIS — M6281 Muscle weakness (generalized): Secondary | ICD-10-CM

## 2021-06-17 NOTE — Therapy (Signed)
OUTPATIENT PHYSICAL THERAPY TREATMENT NOTE   Patient Name: [name] MRN: [id number] DOB:07-18-1965, 56 y.o., male Today's Date: 06/17/2021  PCP: Colon Branch, MD REFERRING PROVIDER: Bary Leriche, PA-C   PT End of Session - 06/17/21 1355     Visit Number 4    Number of Visits 25    Date for PT Re-Evaluation 08/31/21    Authorization Type [facility]    PT Start Time 1359    PT Stop Time 1245    PT Time Calculation (min) 46 min    Equipment Utilized During Treatment Other (comment)   RW   Activity Tolerance Patient tolerated treatment well    Behavior During Therapy WFL for tasks assessed/performed               Past Medical History:  Diagnosis Date   DDD (degenerative disc disease), lumbar    Diabetes mellitus    Hyperlipemia    Hypertension    Obesity    OSA (obstructive sleep apnea)    on CPAP   Past Surgical History:  Procedure Laterality Date   APPLICATION OF ROBOTIC ASSISTANCE FOR SPINAL PROCEDURE N/A 01/11/2021   Procedure: APPLICATION OF ROBOTIC ASSISTANCE FOR SPINAL PROCEDURE;  Surgeon: Judith Part, MD;  Location: [place];  Service: Neurosurgery;  Laterality: N/A;   APPLICATION OF WOUND VAC N/A 01/25/2021   Procedure: APPLICATION OF WOUND VAC;  Surgeon: Dawley, Theodoro Doing, DO;  Location: [place];  Service: Neurosurgery;  Laterality: N/A;   BACK SURGERY     COLONOSCOPY  2019   LUMBAR WOUND DEBRIDEMENT N/A 01/25/2021   Procedure: Irrigation and Debridement of lumbar wound, and wound vacuum assisted closure;  Surgeon: Dawley, Theodoro Doing, DO;  Location: [place];  Service: Neurosurgery;  Laterality: N/A;   TRANSFORAMINAL LUMBAR INTERBODY FUSION (TLIF) WITH PEDICLE SCREW FIXATION 4 LEVEL N/A 01/11/2021   Procedure: Lumbar one-two, Lumbar two-three, Lumbar three-four, Lumbar four-five, Lumbar five Sacral one Open decompression, Transforaminal lumbar interbody fusion, posterolateral instrumented fusion;  Surgeon: Judith Part, MD;  Location: [place];  Service:  Neurosurgery;  Laterality: N/A;   Patient Active Problem List   Diagnosis Date Noted   Loosening of hardware in spine ([place]) 80/99/8338   Hardware complicating wound infection ([place]) 03/13/2021   Medication monitoring encounter 03/13/2021   Slow transit constipation    Disto-occlusion    Lumbar discitis    Epidural abscess    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[facility])    Hypoalbuminemia due to protein-calorie malnutrition ([facility])    Acute blood loss anemia    Other chronic postprocedural pain    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   Klebsiella pneumoniae infection 01/28/2021   Wound infection after surgery 01/25/2021   Muscle spasms of both lower extremities 01/25/2021   Abdominal pain 01/25/2021   Spleen enlarged 01/25/2021   Lumbar radiculopathy 01/11/2021   Spasticity 12/03/2020   Neuropathy 09/28/2020   Chronic pain syndrome 09/28/2020   Intervertebral lumbar disc disorder with myelopathy, lumbar region 09/28/2020   Spondylosis, cervical, with myelopathy 09/28/2020   Unilateral primary osteoarthritis, right knee 10/28/2019   PCP NOTES >>>>>>>> 07/09/2015   DJD (degenerative joint disease) 10/27/2014   Screening for heart disease 06/28/2012   OSA (obstructive sleep apnea) 06/14/2012   ED (erectile dysfunction) 04/26/2012   Annual physical exam 12/29/2011   Achilles tendinitis 09/10/2010   Allergic rhinitis 07/12/2010   Hyperlipidemia 04/26/2007   DM II (diabetes mellitus, type II), controlled ([place]) 04/22/2006   OBESITY NOS 04/22/2006  Essential hypertension 04/22/2006    REFERRING DIAG:  M51.06 (ICD-10-CM) - Lumbar disc herniation with myelopathy   THERAPY DIAG:  Chronic bilateral low back pain, unspecified whether sciatica present  Muscle weakness (generalized)  Other abnormalities of gait and mobility  PERTINENT HISTORY:  s/p L1-S1 decompression and instrumented fusion 01/11/21 Inpatient rehab: 01/18/21- 03/08/21 Incision and debridement of lumbar wound, placement of  wound VAC 01/25/21 Wound care visits occurring on Monday and Thursday  PRECAUTIONS: Wound and Falls  SUBJECTIVE:  "I'm a little sore right now. I had my wound vac changed this morning."   Pain Assessment: Are you having pain? Yes NPRS scale: 7/10 Pain location: low back  PAIN TYPE: stabbing, dull  Pain description: constant  Aggravating factors: prolonged sitting  Relieving factors: laying supine     OBJECTIVE:    LE MMT:   MMT Right 06/05/2021 Left 06/05/2021  Hip flexion 3- 3-  Hip extension      Hip abduction      Hip adduction      Hip internal rotation      Hip external rotation      Knee flexion 4 4  Knee extension 4+ 4+  Ankle dorsiflexion 5 5  Ankle plantarflexion 4 4  Ankle inversion      Ankle eversion       (Blank rows = not tested)   FUNCTIONAL TESTS:            5 x STS: 17 seconds use of BUE            TUG: 25 seconds RW            2 MWT: 284 ft CGA   BERG BALANCE TEST Sitting to Standing: 3.      Stands independently using hands Standing Unsupported: 4.      Stands safely for 2 minutes Sitting Unsupported: 4.     Sits for 2 minutes independently Standing to Sitting: 4.     Sits safely with minimal use of hands Transfers: 3.     Transfers safely definite use of hands Standing with eyes closed: 3.     Stands 10 seconds with supervision Standing with feet together: 3.     Stands for 1 minute with supervision Reaching forward with outstretched arm: 1.     Reaches forward with supervision Retrieving object from the floor: 3.     Able to pick up with supervision Turning to look behind: 2.     Turns sideways only, maintains balance Turning 360 degrees: 2.     Able to turn slowly, but safely Place alternate foot on stool: 0.     Unable, needs assist to keep from falling Standing with one foot in front: 2.     Independent small step for 30 seconds Standing on one foot: 0.     Unable   Total Score: 34/56    GAIT: Distance walked: 284 ft  Assistive  device utilized: Walker - 2 wheeled Level of assistance: CGA Comments: flexed posture, foot flat initial contact, limited push-off, WBOS       TODAY'S TREATMENT  [facility] Adult PT Treatment:                                                DATE: 06/17/21 Therapeutic Exercise: NuStep level 6 x 5 minutes LE/UE Sit to stand from  raised height with overhead ball reach 3 x 8  Sidelying hip abduction 2 x 10 bilateral; partial range  SLR attempted, unable Resisted march hooklying 2 x 10 black band  LAQ 2 x 15 @ 5 lbs  Standing calf raise 2 x 15  Standing march in RW 3 x 10     [facility] Adult PT Treatment:                                                DATE: 06/13/2021 Therapeutic Exercise: NuStep lvl 5 UE/LE x 4 min while taking subjective LAQ 2x10 4# Seated hamstring curl 3x10 BTB Sit to Stand 3x5  Seated clamshell 3x20 Black TB Seated horizontal abd 2x15 GTB Seated row 3x12 BTB  Gait Training: Amb with FWW 358f x 2 with seated rest break in between Final amb with [id number]   [facility]                                                [facility]: [facility] Therapeutic Exercise: LAQ 3x10 3# Seated march 2x20 4# Seated hamstring curl 2x10 GTB Sit to Stand 3x5  Seated clamshell 3x15 Sit to Stand 2x20 black TB Standing hip abd 2x10 in // Standing hip ext x 10 each in // Seated ball squeeze 2x10 - 5" hold Seated horizontal abd 2x15 GTB Seated row 2x10 GTB         PATIENT EDUCATION:  Education details: n/a Person educated: n/a Education method: n/a Education comprehension: n/a     HOME EXERCISE PROGRAM: Access Code: [email or web address]: [email or web address] Date: 06/05/2021 Prepared by: SaGwendolyn Grant Exercises Seated March - 2 x daily - 7 x weekly - 3 sets - 10 reps Seated Hip Abduction with Resistance - 2 x daily - 7 x weekly - 3 sets - 10 reps Seated Long Arc Quad - 2 x daily - 7 x weekly - 3 sets - 10 reps Seated Heel Raise - 2 x daily - 7 x weekly - 3  sets - 10 reps Standing Romberg to 1/2 Tandem Stance - 1 x daily - 7 x weekly - 3 sets - 30 sec hold     ASSESSMENT:   CLINICAL IMPRESSION: CaEthridgeolerated session well today focusing on progression of LE and functional strength/endurance. He remains unable to complete sit to stand from normal chair height without significant use of BUE's. He is able to perform sit to stand from raised height without UE support and good control of ascent/descent, though quickly fatigues. Attempted SLR,though due to weakness he is unable to perform at this time.  Able to progress standing strengthening with patient requiring UE support and occasional CGA due to LOB. Intermittent complaints of LBP throughout session.    OBJECTIVE IMPAIRMENTS Abnormal gait, decreased activity tolerance, decreased balance, decreased endurance, decreased mobility, difficulty walking, decreased ROM, decreased strength, impaired flexibility, improper body mechanics, postural dysfunction, obesity, and pain.    ACTIVITY LIMITATIONS cleaning, community activity, driving, meal prep, occupation, laundry, shopping, and yard work.    PERSONAL FACTORS Age, Fitness, and Time since onset of injury/illness/exacerbation are also affecting patient's functional outcome.      GOALS: Goals reviewed with patient? No   SHORT TERM GOALS:  STG Name Target Date Goal status  1 Patient will be independent and compliant with initial HEP.    Baseline:  06/26/2021 INITIAL  2 Patient will be able to complete sit <> stand transfer without use of UE support.  Baseline:  07/17/2021 INITIAL  3 Patient will complete TUG in <15 seconds with LRAD to reduce his risk of future falls.  Baseline: 07/17/2021 INITIAL  4 Patient will tolerate at least 15 minutes of continuous standing activity without LOB.  Baseline: frequent rest breaks during BERG with LOB  07/17/2021 INITIAL    LONG TERM GOALS:    LTG Name Target Date Goal status  1 Patient will be modified  independent with stair negotiation.  Baseline: 08/28/2021 INITIAL  2 Patient will be modified independent with floor transfer Baseline: per patient required 4 person assist to transfer when he had his fall.  08/28/2021 INITIAL  3 Patient will score at least 45/56 on the BERG to signify improvements in balance and reduction in fall risk.  Baseline: 08/28/2021 INITIAL  4 Patient will be tolerate at least 10 minutes of walking with LRAD to improve his independence with household and community ambulation.  Baseline: SBA with RW in house; manual W/C community ambulation 08/28/2021 INITIAL  5 Patient will score at least 40% function on FOTO to signify clinically meaningful improvement in function.  08/28/21 Initial     PLAN: PT FREQUENCY: 2x/week   PT DURATION: 12 weeks   PLANNED INTERVENTIONS: Therapeutic exercises, Therapeutic activity, Neuro Muscular re-education, Balance training, Gait training, Patient/Family education, Stair training, Wheelchair mobility training, Cryotherapy, Moist heat, and Manual therapy   PLAN FOR NEXT SESSION: review HEP, standing strengthening as tolerated, balance, hip strengthening.   Gwendolyn Grant, PT, DPT, ATC 06/17/21 2:52 PM

## 2021-06-18 NOTE — Progress Notes (Signed)
[name], [name] ([id number]) ?Visit Report for 06/17/2021 ?SuperBill Details ?Patient Name: Date of Service: ?[place], [place]. 06/17/2021 ?Medical Record Number: [id number] ?Patient Account Number: [id number] ?Date of Birth/Sex: Treating RN: ?[birth year] (56 y.o. 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

## 2021-06-18 NOTE — Progress Notes (Signed)
SEVYN, PAREDEZ ([id number]) ?Visit Report for 06/17/2021 ?Arrival Information Details ?Patient Name: Date of Service: ?[place], [name] 06/17/2021 9:45 A M ?Medical Record Number: [id number] ?Patient Account Number: [id number] ?Date of Birth/Sex: Treating RN: ?[birth year] (56 y.o. [name] ?Primary Care Yonael Tulloch: [name] Other Clinician: ?Referring Leldon Steege: ?Treating Jaleel Allen/Extender: Kalman Shan ?[name], [place] ?Weeks in Treatment: 11 ?Visit Information History Since Last Visit ?Added or deleted any medications: No ?Patient Arrived: Wheel Chair ?Any new allergies or adverse reactions: No ?Arrival Time: 10:38 ?Had a fall or experienced change in No ?Accompanied By: friend ?activities of daily living that may affect ?Transfer Assistance: None ?risk of falls: ?Patient Identification Verified: Yes ?Signs or symptoms of abuse/neglect since last visito No ?Secondary Verification Process Completed: Yes ?Hospitalized since last visit: No ?Patient Requires Transmission-Based Precautions: No ?Implantable device outside of the clinic excluding No ?Patient Has Alerts: Yes ?cellular tissue based products placed in the center ?Patient Alerts: Patient on Blood Thinner since last visit: ?No BP Right Arm-PICC Has Dressing in Place as Prescribed: Yes ?Pain Present Now: No ?Electronic Signature(s) ?Signed: 06/18/2021 5:02:09 PM By: Sharyn Creamer RN, BSN ?Entered By: Sharyn Creamer on 06/17/2021 10:39:02 ?-------------------------------------------------------------------------------- ?Encounter Discharge Information Details ?Patient Name: Date of Service: ?[place], [name] 06/17/2021 9:45 A M ?Medical Record Number: [id number] ?Patient Account Number: [id number] ?Date of Birth/Sex: Treating RN: ?June 15, 1965 (56 y.o. [name] ?Primary Care Stanislav Gervase: [name] Other Clinician: ?Referring Megan Presti: ?Treating Millie Forde/Extender: Kalman Shan ?[name], [place] ?Weeks in Treatment: 11 ?Encounter Discharge Information Items ?Discharge  Condition: Stable ?Ambulatory Status: Wheelchair ?Discharge Destination: Home ?Transportation: Private Auto ?Accompanied By: caregiver ?Schedule Follow-up Appointment: Yes ?Clinical Summary of Care: Patient Declined ?Electronic Signature(s) ?Signed: 06/18/2021 5:02:09 PM By: Sharyn Creamer RN, BSN ?Entered By: Sharyn Creamer on 06/17/2021 11:23:09 ?-------------------------------------------------------------------------------- ?Negative Pressure Wound Therapy Maintenance (NPWT) Details ?Patient Name: ?Date of Service: ?[place], [name] 06/17/2021 9:45 A M ?Medical Record Number: [id number] ?Patient Account Number: [id number] ?Date of Birth/Sex: ?Treating RN: ?07-01-1965 (56 y.o. [name] ?Primary Care Paxson Harrower: [name] ?Other Clinician: ?Referring Eran Mistry: ?Treating Jaylee Freeze/Extender: Kalman Shan ?[name], [place] ?Weeks in Treatment: 11 ?NPWT Maintenance Performed for: Wound #1 Midline Back ?Performed By: Sharyn Creamer, RN ?Type: VAC System ?Coverage Size (sq cm): 30.51 ?Pressure Type: Constant ?Pressure Setting: 125 mmHG ?Drain Type: None ?Sponge/Dressing Type: ?Combination : ?Date Initiated: 03/28/2021 ?Dressing Removed: Yes ?Quantity of Sponges/Gauze Removed: 3 total, 2 black foam, 1 white foam ?Canister Changed: No ?Canister Exudate Volume: 150 ?Dressing Reapplied: Yes ?Quantity of Sponges/Gauze Inserted: 1 white foam, 2 black foam ?Respones T Treatment: ?o tolerated well ?Days On NPWT : 82 ?Electronic Signature(s) ?Signed: 06/18/2021 5:02:09 PM By: Sharyn Creamer RN, BSN ?Entered By: Sharyn Creamer on 06/17/2021 11:25:16 ?-------------------------------------------------------------------------------- ?Patient/Caregiver Education Details ?Patient Name: ?Date of Service: ?[place], [name] 3/6/2023andnbsp9:45 A M ?Medical Record Number: [id number] ?Patient Account Number: [id number] ?Date of Birth/Gender: ?Treating RN: ?[birth year] (56 y.o. [name] ?Primary Care Physician: [name] ?Other  Clinician: ?Referring Physician: ?Treating Physician/Extender: Kalman Shan ?[name], [place] ?Weeks in Treatment: 11 ?Education Assessment ?Education Provided To: ?Patient ?Education Topics Provided ?Basic Hygiene: ?Methods: Explain/Verbal ?Responses: Return demonstration correctly ?Pain: ?Methods: Explain/Verbal ?Responses: Return demonstration correctly ?Wound/Skin Impairment: ?Methods: Explain/Verbal ?Responses: Return demonstration correctly ?Electronic Signature(s) ?Signed: 06/18/2021 5:02:09 PM By: Sharyn Creamer RN, BSN ?Entered By: Sharyn Creamer on 06/17/2021 11:22:46 ?-------------------------------------------------------------------------------- ?Wound Assessment Details ?Patient Name: ?Date of Service: ?[place], [name] 06/17/2021 9:45 A M ?Medical Record Number: [id number] ?Patient Account Number: [id number] ?Date of Birth/Sex: ?  Treating RN: ?[birth year] (56 y.o. [name] ?Primary Care Perseus Westall: [name] ?Other Clinician: ?Referring Edoardo Laforte: ?Treating Quill Grinder/Extender: Kalman Shan ?[name], [place] ?Weeks in Treatment: 11 ?Wound Status ?Wound Number: 1 ?Primary Etiology: Open Surgical Wound ?Wound Location: Midline Back ?Wound Status: Open ?Wounding Event: Surgical Injury ?Date Acquired: 01/11/2021 ?Weeks Of Treatment: 11 ?Clustered Wound: No ?Wound Measurements ?Length: (cm) 11.3 ?Width: (cm) 2.7 ?Depth: (cm) 1.1 ?Area: (cm?) 23.962 ?Volume: (cm?) 26.359 ?% Reduction in Area: 68.3% ?% Reduction in Volume: 84.8% ?Wound Description ?Classification: Full Thickness Without Exposed Support Structu ?Exudate Amount: Medium ?Exudate Type: Serosanguineous ?Exudate Color: red, brown ?res ?Treatment Notes ?Wound #1 (Back) Wound Laterality: Midline ?Cleanser ?Peri-Wound Care ?Topical ?Primary Dressing ?wound VAC ?Quantity: 1 ?Secondary Dressing ?Secured With ?Compression Wrap ?Compression Stockings ?Add-Ons ?Electronic Signature(s) ?Signed: 06/18/2021 5:02:09 PM By: Sharyn Creamer RN, BSN ?Entered By: Sharyn Creamer  on 06/17/2021 10:40:04 ?-------------------------------------------------------------------------------- ?Vitals Details ?Patient Name: ?Date of Service: ?[place], [name] 06/17/2021 9:45 A M ?Medical Record Number: [id number] ?Patient Account Number: [id number] ?Date of Birth/Sex: ?Treating RN: ?[birth year] (56 y.o. [name] ?Primary Care Sareen Randon: [name] ?Other Clinician: ?Referring Bridget Gotham: ?Treating Shamell Hittle/Extender: Kalman Shan ?[name], [place] ?Weeks in Treatment: 11 ?Vital Signs ?Time Taken: 09:55 ?Temperature (??F): 98.5 ?Pulse (bpm): 97 ?Respiratory Rate (breaths/min): 18 ?Blood Pressure (mmHg): 124/82 ?Reference Range: 80 - 120 mg / dl ?Electronic Signature(s) ?Signed: 06/18/2021 5:02:09 PM By: Sharyn Creamer RN, BSN ?Entered By: Sharyn Creamer on 06/17/2021 10:39:42 ?

## 2021-06-19 NOTE — Telephone Encounter (Signed)
Approved 06/18/2021-09/16/2021 ?

## 2021-06-20 ENCOUNTER — Ambulatory Visit: Payer: No Typology Code available for payment source

## 2021-06-20 ENCOUNTER — Other Ambulatory Visit: Payer: Self-pay

## 2021-06-20 ENCOUNTER — Encounter (HOSPITAL_BASED_OUTPATIENT_CLINIC_OR_DEPARTMENT_OTHER): Payer: No Typology Code available for payment source | Admitting: General Surgery

## 2021-06-20 DIAGNOSIS — M6281 Muscle weakness (generalized): Secondary | ICD-10-CM

## 2021-06-20 DIAGNOSIS — E11622 Type 2 diabetes mellitus with other skin ulcer: Secondary | ICD-10-CM | POA: Diagnosis not present

## 2021-06-20 DIAGNOSIS — M545 Low back pain, unspecified: Secondary | ICD-10-CM

## 2021-06-20 NOTE — Progress Notes (Signed)
CULLY, LUCKOW ([id number]) ?Visit Report for 06/20/2021 ?Arrival Information Details ?Patient Name: Date of Service: ?[place], [name] 06/20/2021 9:45 A M ?Medical Record Number: [id number] ?Patient Account Number: [id number] ?Date of Birth/Sex: Treating RN: ?May 11, 1965 (57 y.o. M) [name], [name] ?Primary Care Alissah Redmon: [name] Other Clinician: ?Referring Latice Waitman: ?Treating Carolie Mcilrath/Extender: Fredirick Maudlin ?[name], [place] ?Weeks in Treatment: 12 ?Visit Information History Since Last Visit ?Added or deleted any medications: No ?Patient Arrived: Wheel Chair ?Any new allergies or adverse reactions: No ?Arrival Time: 09:45 ?Had a fall or experienced change in No ?Accompanied By: self ?activities of daily living that may affect ?Transfer Assistance: Manual ?risk of falls: ?Patient Identification Verified: Yes ?Signs or symptoms of abuse/neglect since last visito No ?Secondary Verification Process Completed: Yes ?Hospitalized since last visit: No ?Patient Requires Transmission-Based Precautions: No ?Implantable device outside of the clinic excluding No ?Patient Has Alerts: Yes ?cellular tissue based products placed in the center ?Patient Alerts: Patient on Blood Thinner since last visit: ?No BP Right Arm-PICC Has Dressing in Place as Prescribed: Yes ?Pain Present Now: No ?Electronic Signature(s) ?Signed: 06/20/2021 5:31:26 PM By: Deon Pilling RN, BSN ?Entered By: Deon Pilling on 06/20/2021 10:23:50 ?-------------------------------------------------------------------------------- ?Encounter Discharge Information Details ?Patient Name: Date of Service: ?[place], [name] 06/20/2021 9:45 A M ?Medical Record Number: [id number] ?Patient Account Number: [id number] ?Date of Birth/Sex: Treating RN: ?08/15/1965 (56 y.o. M) [name], [name] ?Primary Care Cesario Weidinger: [name] Other Clinician: ?Referring Friend Dorfman: ?Treating Naava Janeway/Extender: Fredirick Maudlin ?[name], [place] ?Weeks in Treatment: 12 ?Encounter Discharge Information Items ?Discharge  Condition: Stable ?Ambulatory Status: Wheelchair ?Discharge Destination: Home ?Transportation: Private Auto ?Accompanied By: family member ?Schedule Follow-up Appointment: Yes ?Clinical Summary of Care: ?Electronic Signature(s) ?Signed: 06/20/2021 5:31:26 PM By: Deon Pilling RN, BSN ?Entered By: Deon Pilling on 06/20/2021 10:26:33 ?-------------------------------------------------------------------------------- ?Negative Pressure Wound Therapy Maintenance (NPWT) Details ?Patient Name: ?Date of Service: ?[place], [name] 06/20/2021 9:45 A M ?Medical Record Number: [id number] ?Patient Account Number: [id number] ?Date of Birth/Sex: ?Treating RN: ?[birth year] (56 y.o. M) [name], [name] ?Primary Care Ammie Warrick: [name] ?Other Clinician: ?Referring Zelma Snead: ?Treating Chet Greenley/Extender: Fredirick Maudlin ?[name], [place] ?Weeks in Treatment: 12 ?NPWT Maintenance Performed for: Wound #1 Midline Back ?Performed By: Deon Pilling, RN ?Type: VAC System ?Coverage Size (sq cm): 22 ?Pressure Type: Constant ?Pressure Setting: 125 mmHG ?Drain Type: None ?Primary Contact: Non-Adherent ?Sponge/Dressing Type: ?Combination : x1 white and x1 black ?Date Initiated: 03/28/2021 ?Dressing Removed: Yes ?Quantity of Sponges/Gauze Removed: x1 white and x1 black ?Canister Changed: Yes ?Canister Exudate Volume: 50 ?Dressing Reapplied: Yes ?Quantity of Sponges/Gauze Inserted: x1 white and x1 black ?Respones T Treatment: ?o tolerated well ?Days On NPWT : 85 ?Electronic Signature(s) ?Signed: 06/20/2021 5:31:26 PM By: Deon Pilling RN, BSN ?Entered By: Deon Pilling on 06/20/2021 10:25:28 ?-------------------------------------------------------------------------------- ?Patient/Caregiver Education Details ?Patient Name: ?Date of Service: ?[place], [name] 3/9/2023andnbsp9:45 A M ?Medical Record Number: [id number] ?Patient Account Number: [id number] ?Date of Birth/Gender: ?Treating RN: ?[birth year] (56 y.o. M) [name], [name] ?Primary Care Physician: [name] ?Other  Clinician: ?Referring Physician: ?Treating Physician/Extender: Fredirick Maudlin ?[name], [place] ?Weeks in Treatment: 12 ?Education Assessment ?Education Provided To: ?Patient ?Education Topics Provided ?Wound/Skin Impairment: ?Handouts: Skin Care Do's and Dont's ?Methods: Explain/Verbal ?Responses: Reinforcements needed ?Electronic Signature(s) ?Signed: 06/20/2021 5:31:26 PM By: Deon Pilling RN, BSN ?Entered By: Deon Pilling on 06/20/2021 10:26:04 ?-------------------------------------------------------------------------------- ?Wound Assessment Details ?Patient Name: ?Date of Service: ?[place], [name] 06/20/2021 9:45 A M ?Medical Record Number: [id number] ?Patient Account Number: [id number] ?Date of Birth/Sex: ?Treating RN: ?03-10-1966 (56 y.o. M) [name],  [name] ?Primary Care Petro Talent: [name] ?Other Clinician: ?Referring Mildred Bollard: ?Treating Paislynn Hegstrom/Extender: Fredirick Maudlin ?[name], [place] ?Weeks in Treatment: 12 ?Wound Status ?Wound Number: 1 ?Primary Etiology: Open Surgical Wound ?Wound Location: Midline Back ?Wound Status: Open ?Wounding Event: Surgical Injury ?Comorbid History: Hypertension, Type II Diabetes, Osteomyelitis ?Date Acquired: 01/11/2021 ?Weeks Of Treatment: 12 ?Clustered Wound: No ?Wound Measurements ?Length: (cm) 11 ?Width: (cm) 2 ?Depth: (cm) 1.6 ?Area: (cm?) 17.279 ?Volume: (cm?) 27.646 ?% Reduction in Area: 77.1% ?% Reduction in Volume: 84.1% ?Epithelialization: Medium (34-66%) ?Tunneling: Yes ?Position (o'clock): 12 ?Maximum Distance: (cm) 3 ?Undermining: No ?Wound Description ?Classification: Full Thickness Without Exposed Support Structures ?Wound Margin: Distinct, outline attached ?Exudate Amount: Medium ?Exudate Type: Serosanguineous ?Exudate Color: red, brown ?Foul Odor After Cleansing: No ?Slough/Fibrino No ?Wound Bed ?Granulation Amount: Large (67-100%) Exposed Structure ?Granulation Quality: Red, Pink, Pale ?Fascia Exposed: No ?Necrotic Amount: None Present (0%) ?Fat Layer (Subcutaneous Tissue)  Exposed: Yes ?Tendon Exposed: No ?Muscle Exposed: No ?Joint Exposed: No ?Bone Exposed: No ?Treatment Notes ?Wound #1 (Back) Wound Laterality: Midline ?Cleanser ?Peri-Wound Care ?Topical ?Primary Dressing ?wound VAC ?Secondary Dressing ?Secured With ?Compression Wrap ?Compression Stockings ?Add-Ons ?Electronic Signature(s) ?Signed: 06/20/2021 5:31:26 PM By: Deon Pilling RN, BSN ?Entered By: Deon Pilling on 06/20/2021 10:24:40 ?-------------------------------------------------------------------------------- ?Vitals Details ?Patient Name: ?Date of Service: ?[place], [name] 06/20/2021 9:45 A M ?Medical Record Number: [id number] ?Patient Account Number: [id number] ?Date of Birth/Sex: ?Treating RN: ?1966/03/22 (56 y.o. M) [name], [name] ?Primary Care Tiea Manninen: [name] ?Other Clinician: ?Referring Karema Tocci: ?Treating Edmund Holcomb/Extender: Fredirick Maudlin ?[name], [place] ?Weeks in Treatment: 12 ?Vital Signs ?Time Taken: 09:45 ?Temperature (??F): 98.2 ?Pulse (bpm): 97 ?Respiratory Rate (breaths/min): 20 ?Blood Pressure (mmHg): 126/82 ?Reference Range: 80 - 120 mg / dl ?Electronic Signature(s) ?Signed: 06/20/2021 5:31:26 PM By: Deon Pilling RN, BSN ?Entered By: Deon Pilling on 06/20/2021 10:24:04 ?

## 2021-06-20 NOTE — Therapy (Signed)
OUTPATIENT PHYSICAL THERAPY TREATMENT NOTE   Patient Name: [name] MRN: [id number] DOB:1966/02/06, 56 y.o., male Today's Date: 06/20/2021  PCP: Colon Branch, MD REFERRING PROVIDER: Bary Leriche, PA-C   PT End of Session - 06/20/21 1403     Visit Number 5    Number of Visits 25    Date for PT Re-Evaluation 08/31/21    Authorization Type [facility]    PT Start Time 1400    PT Stop Time 1440    PT Time Calculation (min) 40 min    Equipment Utilized During Treatment Other (comment)   RW   Activity Tolerance Patient tolerated treatment well    Behavior During Therapy WFL for tasks assessed/performed                Past Medical History:  Diagnosis Date   DDD (degenerative disc disease), lumbar    Diabetes mellitus    Hyperlipemia    Hypertension    Obesity    OSA (obstructive sleep apnea)    on CPAP   Past Surgical History:  Procedure Laterality Date   APPLICATION OF ROBOTIC ASSISTANCE FOR SPINAL PROCEDURE N/A 01/11/2021   Procedure: APPLICATION OF ROBOTIC ASSISTANCE FOR SPINAL PROCEDURE;  Surgeon: Judith Part, MD;  Location: [facility];  Service: Neurosurgery;  Laterality: N/A;   APPLICATION OF WOUND VAC N/A 01/25/2021   Procedure: APPLICATION OF WOUND VAC;  Surgeon: Dawley, Theodoro Doing, DO;  Location: [facility];  Service: Neurosurgery;  Laterality: N/A;   BACK SURGERY     COLONOSCOPY  2019   LUMBAR WOUND DEBRIDEMENT N/A 01/25/2021   Procedure: Irrigation and Debridement of lumbar wound, and wound vacuum assisted closure;  Surgeon: Dawley, Theodoro Doing, DO;  Location: [facility];  Service: Neurosurgery;  Laterality: N/A;   TRANSFORAMINAL LUMBAR INTERBODY FUSION (TLIF) WITH PEDICLE SCREW FIXATION 4 LEVEL N/A 01/11/2021   Procedure: Lumbar one-two, Lumbar two-three, Lumbar three-four, Lumbar four-five, Lumbar five Sacral one Open decompression, Transforaminal lumbar interbody fusion, posterolateral instrumented fusion;  Surgeon: Judith Part, MD;  Location: [facility];  Service:  Neurosurgery;  Laterality: N/A;   Patient Active Problem List   Diagnosis Date Noted   Loosening of hardware in spine ([facility]) 62/95/2841   Hardware complicating wound infection ([facility]) 03/13/2021   Medication monitoring encounter 03/13/2021   Slow transit constipation    Disto-occlusion    Lumbar discitis    Epidural abscess    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[facility])    Hypoalbuminemia due to protein-calorie malnutrition ([facility])    Acute blood loss anemia    Other chronic postprocedural pain    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   Klebsiella pneumoniae infection 01/28/2021   Wound infection after surgery 01/25/2021   Muscle spasms of both lower extremities 01/25/2021   Abdominal pain 01/25/2021   Spleen enlarged 01/25/2021   Lumbar radiculopathy 01/11/2021   Spasticity 12/03/2020   Neuropathy 09/28/2020   Chronic pain syndrome 09/28/2020   Intervertebral lumbar disc disorder with myelopathy, lumbar region 09/28/2020   Spondylosis, cervical, with myelopathy 09/28/2020   Unilateral primary osteoarthritis, right knee 10/28/2019   PCP NOTES >>>>>>>> 07/09/2015   DJD (degenerative joint disease) 10/27/2014   Screening for heart disease 06/28/2012   OSA (obstructive sleep apnea) 06/14/2012   ED (erectile dysfunction) 04/26/2012   Annual physical exam 12/29/2011   Achilles tendinitis 09/10/2010   Allergic rhinitis 07/12/2010   Hyperlipidemia 04/26/2007   DM II (diabetes mellitus, type II), controlled ([facility]) 04/22/2006   OBESITY NOS 04/22/2006  Essential hypertension 04/22/2006    REFERRING DIAG:  M51.06 (ICD-10-CM) - Lumbar disc herniation with myelopathy   THERAPY DIAG:  Chronic bilateral low back pain, unspecified whether sciatica present  Muscle weakness (generalized)  PERTINENT HISTORY:  s/p L1-S1 decompression and instrumented fusion 01/11/21 Inpatient rehab: 01/18/21- 03/08/21 Incision and debridement of lumbar wound, placement of wound VAC 01/25/21 Wound care visits occurring  on Monday and Thursday  PRECAUTIONS: Wound and Falls  SUBJECTIVE:  Pt presents to PT with reports of continued lower back pain, with slight increase in pain due to sitting for prolonged period this morning. Pt has been compliant with his HEP with no adverse effect. Pt is ready to begin PT treatment at this time.   Pain Assessment: Are you having pain? Yes NPRS scale: 8/10 Pain location: low back  PAIN TYPE: stabbing, dull  Pain description: constant  Aggravating factors: prolonged sitting  Relieving factors: laying supine     OBJECTIVE:    LE MMT:   MMT Right 06/05/2021 Left 06/05/2021  Hip flexion 3- 3-  Hip extension      Hip abduction      Hip adduction      Hip internal rotation      Hip external rotation      Knee flexion 4 4  Knee extension 4+ 4+  Ankle dorsiflexion 5 5  Ankle plantarflexion 4 4  Ankle inversion      Ankle eversion       (Blank rows = not tested)   FUNCTIONAL TESTS:            5 x STS: 17 seconds use of BUE            TUG: 25 seconds RW            2 MWT: 284 ft CGA   BERG BALANCE TEST Sitting to Standing: 3.      Stands independently using hands Standing Unsupported: 4.      Stands safely for 2 minutes Sitting Unsupported: 4.     Sits for 2 minutes independently Standing to Sitting: 4.     Sits safely with minimal use of hands Transfers: 3.     Transfers safely definite use of hands Standing with eyes closed: 3.     Stands 10 seconds with supervision Standing with feet together: 3.     Stands for 1 minute with supervision Reaching forward with outstretched arm: 1.     Reaches forward with supervision Retrieving object from the floor: 3.     Able to pick up with supervision Turning to look behind: 2.     Turns sideways only, maintains balance Turning 360 degrees: 2.     Able to turn slowly, but safely Place alternate foot on stool: 0.     Unable, needs assist to keep from falling Standing with one foot in front: 2.     Independent small  step for 30 seconds Standing on one foot: 0.     Unable   Total Score: 34/56    GAIT: Distance walked: 284 ft  Assistive device utilized: Walker - 2 wheeled Level of assistance: CGA Comments: flexed posture, foot flat initial contact, limited push-off, WBOS       TODAY'S TREATMENT  [facility] Adult PT Treatment:  DATE: 06/20/21 Therapeutic Exercise: NuStep level 6 x 5 minutes LE/UE while taking subjective Sit to stand from raised height with overhead ball reach 3x5 LAQ 2x15 4# each Seated clamshell 3x20 Black TB Standing in FWW heel raise 4# 2x15 Standing in [place] march 2x20 4# Seated horizontal abd 2x15 GTB Seated row 3x15 Black TB Gait Training: Amb with FWW 347f with CGA and cues for increased trunk ext  [facility]                                                DATE: 06[facility]21 Therapeutic Exercise: NuStep level 6 x 5 minutes LE/UE Sit to stand from raised height with overhead ball reach 3 x 8  Sidelying hip abduction 2 x 10 bilateral; partial range  SLR attempted, unable Resisted march hooklying 2 x 10 black band  LAQ 2 x 15 @ 5 lbs  Standing calf raise 2 x 15  Standing march in RW 3 x 10    [facility] Adult PT Treatment:                                                DATE: 06/13/2021 Therapeutic Exercise: NuStep lvl 5 UE/LE x 4 min while taking subjective LAQ 2x10 4# Seated hamstring curl 3x10 BTB Sit to Stand 3x5  Seated clamshell 3x20 Black TB Seated horizontal abd 2x15 GTB Seated row 3x12 BTB  Gait Training: Amb with FWW [id number] 2 with seated rest break in between Final amb with [id number]    PATIENT EDUCATION:  Education details: n/a Person educated: n/a Education method: n/a Education comprehension: n/a     HOME EXERCISE PROGRAM: Access Code: [email or web address]: [email or web address] Date: 06/05/2021 Prepared by: SamGwendolyn GrantExercises Seated March - 2 x daily - 7 x weekly - 3 sets - 10  reps Seated Hip Abduction with Resistance - 2 x daily - 7 x weekly - 3 sets - 10 reps Seated Long Arc Quad - 2 x daily - 7 x weekly - 3 sets - 10 reps Seated Heel Raise - 2 x daily - 7 x weekly - 3 sets - 10 reps Standing Romberg to 1/2 Tandem Stance - 1 x daily - 7 x weekly - 3 sets - 30 sec hold     ASSESSMENT:   CLINICAL IMPRESSION: Pt was able to complete all prescribed exercises with no adverse effect or increase in pain. Therapy today focused on improving functional endurance, proximal hip, and thoracic/lumbar extensor strength for improving mobility and decreasing pain. Pt continues to benefit from skilled PT services working on improving strength, gait tolerance, and general functional endurance. Will continue to progress as able per POC.    OBJECTIVE IMPAIRMENTS Abnormal gait, decreased activity tolerance, decreased balance, decreased endurance, decreased mobility, difficulty walking, decreased ROM, decreased strength, impaired flexibility, improper body mechanics, postural dysfunction, obesity, and pain.    ACTIVITY LIMITATIONS cleaning, community activity, driving, meal prep, occupation, laundry, shopping, and yard work.    PERSONAL FACTORS Age, Fitness, and Time since onset of injury/illness/exacerbation are also affecting patient's functional outcome.      GOALS: Goals reviewed with patient? No   SHORT TERM GOALS:   STG Name Target Date Goal status  1 Patient will be independent and compliant with initial HEP.    Baseline:  06/26/2021 MET  2 Patient will be able to complete sit <> stand transfer without use of UE support.  Baseline:  07/17/2021 INITIAL  3 Patient will complete TUG in <15 seconds with LRAD to reduce his risk of future falls.  Baseline: 07/17/2021 INITIAL  4 Patient will tolerate at least 15 minutes of continuous standing activity without LOB.  Baseline: frequent rest breaks during BERG with LOB  07/17/2021 INITIAL    LONG TERM GOALS:    LTG Name Target Date  Goal status  1 Patient will be modified independent with stair negotiation.  Baseline: 08/28/2021 INITIAL  2 Patient will be modified independent with floor transfer Baseline: per patient required 4 person assist to transfer when he had his fall.  08/28/2021 INITIAL  3 Patient will score at least 45/56 on the BERG to signify improvements in balance and reduction in fall risk.  Baseline: 08/28/2021 INITIAL  4 Patient will be tolerate at least 10 minutes of walking with LRAD to improve his independence with household and community ambulation.  Baseline: SBA with RW in house; manual W/C community ambulation 08/28/2021 INITIAL  5 Patient will score at least 40% function on FOTO to signify clinically meaningful improvement in function.  08/28/21 Initial     PLAN: PT FREQUENCY: 2x/week   PT DURATION: 12 weeks   PLANNED INTERVENTIONS: Therapeutic exercises, Therapeutic activity, Neuro Muscular re-education, Balance training, Gait training, Patient/Family education, Stair training, Wheelchair mobility training, Cryotherapy, Moist heat, and Manual therapy   PLAN FOR NEXT SESSION: review HEP, standing strengthening as tolerated, balance, hip strengthening.   Ward Chatters, PT 06/20/21 2:43 PM

## 2021-06-21 NOTE — Progress Notes (Signed)
[name], [name] ([id number]) ?Visit Report for 06/20/2021 ?SuperBill Details ?Patient Name: Date of Service: ?[place], [place]. 06/20/2021 ?Medical Record Number: [id number] ?Patient Account Number: [id number] ?Date of Birth/Sex: Treating RN: ?1965-09-14 (56 y.o. 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

## 2021-06-24 ENCOUNTER — Encounter (HOSPITAL_BASED_OUTPATIENT_CLINIC_OR_DEPARTMENT_OTHER): Payer: No Typology Code available for payment source | Admitting: General Surgery

## 2021-06-24 ENCOUNTER — Other Ambulatory Visit: Payer: Self-pay

## 2021-06-24 ENCOUNTER — Ambulatory Visit: Payer: No Typology Code available for payment source

## 2021-06-24 DIAGNOSIS — M6281 Muscle weakness (generalized): Secondary | ICD-10-CM

## 2021-06-24 DIAGNOSIS — E11622 Type 2 diabetes mellitus with other skin ulcer: Secondary | ICD-10-CM | POA: Diagnosis not present

## 2021-06-24 DIAGNOSIS — M545 Low back pain, unspecified: Secondary | ICD-10-CM | POA: Diagnosis not present

## 2021-06-24 DIAGNOSIS — G8929 Other chronic pain: Secondary | ICD-10-CM

## 2021-06-24 NOTE — Therapy (Signed)
OUTPATIENT PHYSICAL THERAPY TREATMENT NOTE   Patient Name: [name] MRN: [id number] DOB:1965-11-25, 56 y.o., male Today's Date: 06/24/2021  PCP: Colon Branch, MD REFERRING PROVIDER: Bary Leriche, PA-C   PT End of Session - 06/24/21 1356     Visit Number 6    Number of Visits 25    Date for PT Re-Evaluation 08/31/21    Authorization Type [facility] [employer]    PT Start Time 1400    PT Stop Time 2563    PT Time Calculation (min) 43 min    Equipment Utilized During Treatment Other (comment)   RW   Activity Tolerance Patient tolerated treatment well    Behavior During Therapy WFL for tasks assessed/performed                 Past Medical History:  Diagnosis Date   DDD (degenerative disc disease), lumbar    Diabetes mellitus    Hyperlipemia    Hypertension    Obesity    OSA (obstructive sleep apnea)    on CPAP   Past Surgical History:  Procedure Laterality Date   APPLICATION OF ROBOTIC ASSISTANCE FOR SPINAL PROCEDURE N/A 01/11/2021   Procedure: APPLICATION OF ROBOTIC ASSISTANCE FOR SPINAL PROCEDURE;  Surgeon: Judith Part, MD;  Location: [place];  Service: Neurosurgery;  Laterality: N/A;   APPLICATION OF WOUND VAC N/A 01/25/2021   Procedure: APPLICATION OF WOUND VAC;  Surgeon: Dawley, Theodoro Doing, DO;  Location: [place];  Service: Neurosurgery;  Laterality: N/A;   BACK SURGERY     COLONOSCOPY  2019   LUMBAR WOUND DEBRIDEMENT N/A 01/25/2021   Procedure: Irrigation and Debridement of lumbar wound, and wound vacuum assisted closure;  Surgeon: Dawley, Theodoro Doing, DO;  Location: [place];  Service: Neurosurgery;  Laterality: N/A;   TRANSFORAMINAL LUMBAR INTERBODY FUSION (TLIF) WITH PEDICLE SCREW FIXATION 4 LEVEL N/A 01/11/2021   Procedure: Lumbar one-two, Lumbar two-three, Lumbar three-four, Lumbar four-five, Lumbar five Sacral one Open decompression, Transforaminal lumbar interbody fusion, posterolateral instrumented fusion;  Surgeon: Judith Part, MD;  Location: [place];  Service:  Neurosurgery;  Laterality: N/A;   Patient Active Problem List   Diagnosis Date Noted   Loosening of hardware in spine ([place]) 89/37/3428   Hardware complicating wound infection ([place]) 03/13/2021   Medication monitoring encounter 03/13/2021   Slow transit constipation    Disto-occlusion    Lumbar discitis    Epidural abscess    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[facility])    Hypoalbuminemia due to protein-calorie malnutrition ([facility])    Acute blood loss anemia    Other chronic postprocedural pain    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   Klebsiella pneumoniae infection 01/28/2021   Wound infection after surgery 01/25/2021   Muscle spasms of both lower extremities 01/25/2021   Abdominal pain 01/25/2021   Spleen enlarged 01/25/2021   Lumbar radiculopathy 01/11/2021   Spasticity 12/03/2020   Neuropathy 09/28/2020   Chronic pain syndrome 09/28/2020   Intervertebral lumbar disc disorder with myelopathy, lumbar region 09/28/2020   Spondylosis, cervical, with myelopathy 09/28/2020   Unilateral primary osteoarthritis, right knee 10/28/2019   PCP NOTES >>>>>>>> 07/09/2015   DJD (degenerative joint disease) 10/27/2014   Screening for heart disease 06/28/2012   OSA (obstructive sleep apnea) 06/14/2012   ED (erectile dysfunction) 04/26/2012   Annual physical exam 12/29/2011   Achilles tendinitis 09/10/2010   Allergic rhinitis 07/12/2010   Hyperlipidemia 04/26/2007   DM II (diabetes mellitus, type II), controlled ([place]) 04/22/2006   OBESITY NOS  04/22/2006   Essential hypertension 04/22/2006    REFERRING DIAG:  M51.06 (ICD-10-CM) - Lumbar disc herniation with myelopathy   THERAPY DIAG:  Chronic bilateral low back pain, unspecified whether sciatica present  Muscle weakness (generalized)  PERTINENT HISTORY:  s/p L1-S1 decompression and instrumented fusion 01/11/21 Inpatient rehab: 01/18/21- 03/08/21 Incision and debridement of lumbar wound, placement of wound VAC 01/25/21 Wound care visits occurring  on Monday and Thursday  PRECAUTIONS: Wound and Falls  SUBJECTIVE:  Pt presents to PT with reports of continued lower back pain. Had wound vac changed today, notes this usually increase pain. Pt has been compliant with HEP with no adverse effect. He is ready to begin PT a this time.   Pain Assessment: Are you having pain? Yes NPRS scale: 8/10 Pain location: low back  PAIN TYPE: stabbing, dull  Pain description: constant  Aggravating factors: prolonged sitting  Relieving factors: laying supine     OBJECTIVE:    OUTCOMES:  FOTO: 24% function   LE MMT:   MMT Right 06/05/2021 Left 06/05/2021  Hip flexion 3- 3-  Hip extension      Hip abduction      Hip adduction      Hip internal rotation      Hip external rotation      Knee flexion 4 4  Knee extension 4+ 4+  Ankle dorsiflexion 5 5  Ankle plantarflexion 4 4  Ankle inversion      Ankle eversion       (Blank rows = not tested)   FUNCTIONAL TESTS:            5 x STS: 17 seconds use of BUE            TUG: 25 seconds RW            2 MWT: 284 ft CGA   BERG BALANCE TEST Sitting to Standing: 3.      Stands independently using hands Standing Unsupported: 4.      Stands safely for 2 minutes Sitting Unsupported: 4.     Sits for 2 minutes independently Standing to Sitting: 4.     Sits safely with minimal use of hands Transfers: 3.     Transfers safely definite use of hands Standing with eyes closed: 3.     Stands 10 seconds with supervision Standing with feet together: 3.     Stands for 1 minute with supervision Reaching forward with outstretched arm: 1.     Reaches forward with supervision Retrieving object from the floor: 3.     Able to pick up with supervision Turning to look behind: 2.     Turns sideways only, maintains balance Turning 360 degrees: 2.     Able to turn slowly, but safely Place alternate foot on stool: 0.     Unable, needs assist to keep from falling Standing with one foot in front: 2.     Independent small  step for 30 seconds Standing on one foot: 0.     Unable   Total Score: 34/56    GAIT: Distance walked: 284 ft  Assistive device utilized: Walker - 2 wheeled Level of assistance: CGA Comments: flexed posture, foot flat initial contact, limited push-off, WBOS       TODAY'S TREATMENT  [facility] Adult PT Treatment:  DATE: 06/24/21 Therapeutic Exercise: NuStep level 6 x 5 minutes LE/UE while taking subjective Sit to stand from raised height with no UE support 2x10 LAQ 3x10 5# each Seated clamshell 3x20 Black TB Standing in FWW march 2x20 5# Gait Training: Amb with FWW 313f with CGA and cues for increased trunk ext  Neuro Re-Ed Tandem stance 2x30" each Hurdle step over (5) in // - step to pattern x 2 laps  [facility]                                                DATE: 06[facility]/21 Therapeutic Exercise: NuStep level 6 x 5 minutes LE/UE while taking subjective Sit to stand from raised height with overhead ball reach 3x5 LAQ 2x15 4# each Seated clamshell 3x20 Black TB Standing in FWW heel raise 4# 2x15 Standing in FWW march 2x20 4# Seated horizontal abd 2x15 GTB Seated row 3x15 Black TB Gait Training: Amb with FWW [id number] CGA and cues for increased trunk ext  [facility]                                                DATE: 06/17/21 Therapeutic Exercise: NuStep level 6 x 5 minutes LE/UE Sit to stand from raised height with overhead ball reach 3 x 8  Sidelying hip abduction 2 x 10 bilateral; partial range  SLR attempted, unable Resisted march hooklying 2 x 10 black band  LAQ 2 x 15 @ 5 lbs  Standing calf raise 2 x 15  Standing march in RW 3 x 10   PATIENT EDUCATION:  Education details: n/a Person educated: n/a Education method: n/a Education comprehension: n/a     HOME EXERCISE PROGRAM: Access Code: [email or web address]: [email or web address] Date: 06/05/2021 Prepared by: SaGwendolyn Grant  Exercises Seated March - 2 x daily - 7 x weekly - 3 sets - 10 reps Seated Hip Abduction with Resistance - 2 x daily - 7 x weekly - 3 sets - 10 reps Seated Long Arc Quad - 2 x daily - 7 x weekly - 3 sets - 10 reps Seated Heel Raise - 2 x daily - 7 x weekly - 3 sets - 10 reps Standing Romberg to 1/2 Tandem Stance - 1 x daily - 7 x weekly - 3 sets - 30 sec hold     ASSESSMENT:   CLINICAL IMPRESSION: Pt was able to complete all prescribed exercises with no adverse effect. He continues to have significant lower back pain that limits functional mobility and increases difficulty with transfers. Therapy today focused on improving proximal hip and general LE strength in order to decrease pain and improve mobility. Also introduced balance exercises today working on improving static and dynamic movements. Pt continues to benefit from skilled PT services and will continue to be seen and progressed as tolerated.    OBJECTIVE IMPAIRMENTS Abnormal gait, decreased activity tolerance, decreased balance, decreased endurance, decreased mobility, difficulty walking, decreased ROM, decreased strength, impaired flexibility, improper body mechanics, postural dysfunction, obesity, and pain.    ACTIVITY LIMITATIONS cleaning, community activity, driving, meal prep, occupation, laundry, shopping, and yard work.    PERSONAL FACTORS Age, Fitness, and Time since onset of injury/illness/exacerbation are also affecting  patient's functional outcome.      GOALS: Goals reviewed with patient? No   SHORT TERM GOALS:   STG Name Target Date Goal status  1 Patient will be independent and compliant with initial HEP.    Baseline:  06/26/2021 MET  2 Patient will be able to complete sit <> stand transfer without use of UE support.  Baseline:  07/17/2021 INITIAL  3 Patient will complete TUG in <15 seconds with LRAD to reduce his risk of future falls.  Baseline: 07/17/2021 INITIAL  4 Patient will tolerate at least 15 minutes of  continuous standing activity without LOB.  Baseline: frequent rest breaks during BERG with LOB  07/17/2021 INITIAL    LONG TERM GOALS:    LTG Name Target Date Goal status  1 Patient will be modified independent with stair negotiation.  Baseline: 08/28/2021 INITIAL  2 Patient will be modified independent with floor transfer Baseline: per patient required 4 person assist to transfer when he had his fall.  08/28/2021 INITIAL  3 Patient will score at least 45/56 on the BERG to signify improvements in balance and reduction in fall risk.  Baseline: 08/28/2021 INITIAL  4 Patient will be tolerate at least 10 minutes of walking with LRAD to improve his independence with household and community ambulation.  Baseline: SBA with RW in house; manual W/C community ambulation 08/28/2021 INITIAL  5 Patient will score at least 40% function on FOTO to signify clinically meaningful improvement in function.  06/24/2021: 24% function 08/28/21 ONGOING    PLAN: PT FREQUENCY: 2x/week   PT DURATION: 12 weeks   PLANNED INTERVENTIONS: Therapeutic exercises, Therapeutic activity, Neuro Muscular re-education, Balance training, Gait training, Patient/Family education, Stair training, Wheelchair mobility training, Cryotherapy, Moist heat, and Manual therapy   PLAN FOR NEXT SESSION: review HEP, standing strengthening as tolerated, balance, hip strengthening.   Ward Chatters, PT 06/24/21 2:45 PM

## 2021-06-25 ENCOUNTER — Ambulatory Visit (INDEPENDENT_AMBULATORY_CARE_PROVIDER_SITE_OTHER): Payer: No Typology Code available for payment source | Admitting: Infectious Diseases

## 2021-06-25 ENCOUNTER — Encounter: Payer: Self-pay | Admitting: Infectious Diseases

## 2021-06-25 ENCOUNTER — Other Ambulatory Visit: Payer: Self-pay

## 2021-06-25 ENCOUNTER — Other Ambulatory Visit (HOSPITAL_COMMUNITY): Payer: Self-pay

## 2021-06-25 VITALS — BP 134/85 | HR 124 | Temp 97.4°F | Ht 74.0 in | Wt 258.0 lb

## 2021-06-25 DIAGNOSIS — Z969 Presence of functional implant, unspecified: Secondary | ICD-10-CM | POA: Diagnosis not present

## 2021-06-25 DIAGNOSIS — Z5181 Encounter for therapeutic drug level monitoring: Secondary | ICD-10-CM | POA: Diagnosis not present

## 2021-06-25 DIAGNOSIS — T847XXD Infection and inflammatory reaction due to other internal orthopedic prosthetic devices, implants and grafts, subsequent encounter: Secondary | ICD-10-CM

## 2021-06-25 MED ORDER — CEFADROXIL 500 MG PO CAPS
500.0000 mg | ORAL_CAPSULE | Freq: Two times a day (BID) | ORAL | 2 refills | Status: DC
  Filled 2021-06-25: qty 60, 30d supply, fill #0
  Filled 2021-08-04: qty 60, 30d supply, fill #1
  Filled 2021-08-26 – 2021-09-02 (×2): qty 60, 30d supply, fill #2

## 2021-06-25 NOTE — Progress Notes (Signed)
? ?   ? ? ? ?Patient Active Problem List  ? Diagnosis Date Noted  ? Loosening of hardware in spine ([place]) 04/11/2021  ? Hardware complicating wound infection ([place]) 03/13/2021  ? Medication monitoring encounter 03/13/2021  ? Slow transit constipation   ? Disto-occlusion   ? Lumbar discitis   ? Epidural abscess   ? 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[place])   ? Hypoalbuminemia due to protein-calorie malnutrition ([place])   ? Acute blood loss anemia   ? Other chronic postprocedural pain   ? 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 Klebsiella pneumoniae infection 01/28/2021  ? Wound infection after surgery 01/25/2021  ? Muscle spasms of both lower extremities 01/25/2021  ? Abdominal pain 01/25/2021  ? Spleen enlarged 01/25/2021  ? Lumbar radiculopathy 01/11/2021  ? Spasticity 12/03/2020  ? Neuropathy 09/28/2020  ? Chronic pain syndrome 09/28/2020  ? Intervertebral lumbar disc disorder with myelopathy, lumbar region 09/28/2020  ? Spondylosis, cervical, with myelopathy 09/28/2020  ? Unilateral primary osteoarthritis, right knee 10/28/2019  ? PCP NOTES >>>>>>>> 07/09/2015  ? DJD (degenerative joint disease) 10/27/2014  ? Screening for heart disease 06/28/2012  ? OSA (obstructive sleep apnea) 06/14/2012  ? ED (erectile dysfunction) 04/26/2012  ? Annual physical exam 12/29/2011  ? Achilles tendinitis 09/10/2010  ? Allergic rhinitis 07/12/2010  ? Hyperlipidemia 04/26/2007  ? DM II (diabetes mellitus, type II), controlled ([place]) 04/22/2006  ? OBESITY NOS 04/22/2006  ? Essential hypertension 04/22/2006  ? ?Current Outpatient Medications on File Prior to Visit  ?Medication Sig Dispense Refill  ? ascorbic acid (VITAMIN C) 500 MG tablet Take 1 tablet (500 mg total) by mouth 2 (two) times daily. 100 tablet 3  ? blood glucose meter kit and supplies Use up to four times daily as directed. Dx: E11.9 1 each 0  ? Blood Glucose Monitoring Suppl (ONETOUCH VERIO FLEX SYSTEM) w/Device KIT Test blood sugar twice a day.  Dx code: E11.9 1 kit 0  ? cefadroxil  (DURICEF) 500 MG capsule Take 2 capsules (1,000 mg total) by mouth 2 (two) times daily. 120 capsule 1  ? citalopram (CELEXA) 20 MG tablet Take 1 tablet (20 mg total) by mouth daily. 90 tablet 1  ? cyclobenzaprine (FLEXERIL) 10 MG tablet Take 1 tablet (10 mg total) by mouth 3 (three) times daily as needed for muscle spasms. 90 tablet 0  ? diazepam (VALIUM) 5 MG tablet Take 1 tablet (5 mg total) by mouth at bedtime. 30 tablet 0  ? fluticasone (FLONASE) 50 MCG/ACT nasal spray Place into the nose.    ? gabapentin (NEURONTIN) 600 MG tablet Take 600 mg by mouth 3 (three) times daily.    ? glucose blood (ONETOUCH VERIO) test strip Check blood sugars before meals and at bedtime.  twice daily 200 strip 12  ? HYDROmorphone (DILAUDID) 2 MG tablet Take 1 tablet (2 mg total) by mouth every Monday, Wednesday, and Friday. Take 30 minutes prior to Wound VAC change 28 tablet 0  ? metFORMIN (GLUCOPHAGE) 1000 MG tablet Take 1 tablet (1,000 mg total) by mouth daily with breakfast.    ? morphine (MS CONTIN) 15 MG 12 hr tablet Take 2 tablets (30 mg total) by mouth every 12 (twelve) hours. 60 tablet 0  ? naloxone (NARCAN) nasal spray 4 mg/0.1 mL Use as directed 2 each 1  ? nutrition supplement, JUVEN, (JUVEN) PACK Take 1 packet by mouth 2 (two) times daily between meals. 60 packet 0  ? ondansetron (ZOFRAN) 4 MG tablet TAKE 1 TABLET (4 MG TOTAL)  BY MOUTH EVERY 8 (EIGHT) HOURS AS NEEDED FOR NAUSEA OR VOMITING. CAN CAUSE CONSTIPATION- MONITOR- 90 tablet 1  ? OneTouch Delica Lancets 54T MISC Test blood sugar before meals and at bedtime.  Dx code: E11.9 200 each 12  ? oxyCODONE (ROXICODONE) 15 MG immediate release tablet Take 1 tablet (15 mg total) by mouth every 8 (eight) hours as needed ((score 7 to 10)). 90 tablet 0  ? polyethylene glycol (MIRALAX / GLYCOLAX) 17 g packet Take 17 g by mouth 2 (two) times daily. 14 each 0  ? promethazine (PHENERGAN) 12.5 MG tablet Take 1 tablet (12.5 mg total) by mouth every 6 (six) hours as needed for nausea  or vomiting. 90 tablet 5  ? protein supplement (RESOURCE BENEPROTEIN) POWD Take 6 g by mouth 3 (three) times daily with meals. 227 g 0  ? rosuvastatin (CRESTOR) 20 MG tablet Take 1 tablet (20 mg total) by mouth daily. 90 tablet 3  ? senna (SENOKOT) 8.6 MG TABS tablet Take 3 tablets (25.8 mg total) by mouth 2 (two) times daily. 270 tablet 0  ? simethicone (MYLICON) 80 MG chewable tablet Chew 1 tablet (80 mg total) by mouth 4 (four) times daily as needed for flatulence. 60 tablet 0  ? tadalafil (CIALIS) 20 MG tablet TAKE 1/2 TO 1 TABLET BY MOUTH DAILY AS NEEDED FOR ERECTILE DYSFUNCTION 30 tablet 3  ? tamsulosin (FLOMAX) 0.4 MG CAPS capsule Take 2 capsules (0.8 mg total) by mouth daily. 60 capsule 5  ? tiZANidine (ZANAFLEX) 4 MG tablet Take 1 tablet (4 mg total) by mouth 3 (three) times daily. 90 tablet 5  ? Zinc Sulfate 220 (50 Zn) MG TABS Take 1 tablet (220 mg total) by mouth daily. 100 tablet 5  ? ?No current facility-administered medications on file prior to visit.  ? ? ?Subjective: ?Here for follow up in the setting of deep lumbar wound infection associated with hardware. He is taking cefadroxil 1000 mg PO BID without any issues. Denies fevers, chills and sweats. Denies nausea, vomiting and diarrhea. Back pain is 7/10. Took one oxycodone to come for the appointment. He needs to take amy be one oxycodone in a week at the most. He is able to walk with the help of walker. Has been following with wound care - thought to be healing well. Still has wound vac. Has a fu with Neurosurgery in a month. No complaints today.  ? ?Review of Systems: ?ROS  ?All other systems reviewed and negative ? ?Past Medical History:  ?Diagnosis Date  ? DDD (degenerative disc disease), lumbar   ? Diabetes mellitus   ? Hyperlipemia   ? Hypertension   ? Obesity   ? OSA (obstructive sleep apnea)   ? on CPAP  ? ?Past Surgical History:  ?Procedure Laterality Date  ? APPLICATION OF ROBOTIC ASSISTANCE FOR SPINAL PROCEDURE N/A 01/11/2021  ? Procedure:  APPLICATION OF ROBOTIC ASSISTANCE FOR SPINAL PROCEDURE;  Surgeon: Judith Part, MD;  Location: [facility];  Service: Neurosurgery;  Laterality: N/A;  ? APPLICATION OF WOUND VAC N/A 01/25/2021  ? Procedure: APPLICATION OF WOUND VAC;  Surgeon: Dawley, Theodoro Doing, DO;  Location: [place];  Service: Neurosurgery;  Laterality: N/A;  ? BACK SURGERY    ? COLONOSCOPY  2019  ? LUMBAR WOUND DEBRIDEMENT N/A 01/25/2021  ? Procedure: Irrigation and Debridement of lumbar wound, and wound vacuum assisted closure;  Surgeon: Dawley, Theodoro Doing, DO;  Location: [place];  Service: Neurosurgery;  Laterality: N/A;  ? TRANSFORAMINAL LUMBAR INTERBODY FUSION (  TLIF) WITH PEDICLE SCREW FIXATION 4 LEVEL N/A 01/11/2021  ? Procedure: Lumbar one-two, Lumbar two-three, Lumbar three-four, Lumbar four-five, Lumbar five Sacral one Open decompression, Transforaminal lumbar interbody fusion, posterolateral instrumented fusion;  Surgeon: Judith Part, MD;  Location: [facility];  Service: Neurosurgery;  Laterality: N/A;  ? ? ?Social History  ? ?Tobacco Use  ? Smoking status: Never  ? Smokeless tobacco: Never  ?Vaping Use  ? Vaping Use: Never used  ?Substance Use Topics  ? Alcohol use: No  ? Drug use: No  ? ? ?Family History  ?Problem Relation Age of Onset  ? Diabetes Mother   ? Hypertension Neg Hx   ? Coronary artery disease Neg Hx   ? Colon cancer Neg Hx   ? Prostate cancer Neg Hx   ? ? ?Allergies  ?Allergen Reactions  ? Cymbalta [Duloxetine Hcl] Other (See Comments)  ? Penicillins Other (See Comments)  ?  REACTION: Blister and Sores in the mouth  ? ? ?Health Maintenance  ?Topic Date Due  ? Zoster Vaccines- Shingrix (1 of 2) Never done  ? OPHTHALMOLOGY EXAM  06/12/2017  ? COVID-19 Vaccine (5 - Booster for Pfizer series) 11/09/2020  ? FOOT EXAM  02/09/2021  ? HEMOGLOBIN A1C  10/31/2021  ? TETANUS/TDAP  12/28/2021  ? COLONOSCOPY (Pts 45-64yr Insurance coverage will need to be confirmed)  01/11/2025  ? INFLUENZA VACCINE  Completed  ? Hepatitis C Screening   Completed  ? HIV Screening  Completed  ? HPV VACCINES  Aged Out  ? ? ?Objective: ?BP 134/85   Temp (!) 97.4 ?F (36.3 ?C) (Oral)   Ht '6\' 2"'  (1.88 m)   Wt 258 lb (117 kg)   SpO2 98%   BMI 33.13 kg/m?  ? ? ?P

## 2021-06-26 LAB — CBC
HCT: 36.8 % — ABNORMAL LOW (ref 38.5–50.0)
Hemoglobin: 11.7 g/dL — ABNORMAL LOW (ref 13.2–17.1)
MCH: 27.2 pg (ref 27.0–33.0)
MCHC: 31.8 g/dL — ABNORMAL LOW (ref 32.0–36.0)
MCV: 85.6 fL (ref 80.0–100.0)
MPV: 10.6 fL (ref 7.5–12.5)
Platelets: 257 10*3/uL (ref 140–400)
RBC: 4.3 10*6/uL (ref 4.20–5.80)
RDW: 14 % (ref 11.0–15.0)
WBC: 5.8 10*3/uL (ref 3.8–10.8)

## 2021-06-26 LAB — BASIC METABOLIC PANEL
BUN: 11 mg/dL (ref 7–25)
CO2: 27 mmol/L (ref 20–32)
Calcium: 9.3 mg/dL (ref 8.6–10.3)
Chloride: 102 mmol/L (ref 98–110)
Creat: 0.93 mg/dL (ref 0.70–1.30)
Glucose, Bld: 132 mg/dL — ABNORMAL HIGH (ref 65–99)
Potassium: 3.8 mmol/L (ref 3.5–5.3)
Sodium: 139 mmol/L (ref 135–146)

## 2021-06-26 LAB — C-REACTIVE PROTEIN: CRP: 13.5 mg/L — ABNORMAL HIGH (ref <8.0)

## 2021-06-26 LAB — SEDIMENTATION RATE: Sed Rate: 34 mm/h — ABNORMAL HIGH (ref 0–20)

## 2021-06-26 NOTE — Therapy (Signed)
?OUTPATIENT PHYSICAL THERAPY TREATMENT NOTE ? ? ?Patient Name: [name] ?MRN: [id number] ?DOB:08/21/1965, 56 y.o., male ?Today's Date: 06/27/2021 ? ?PCP: Colon Branch, MD ?REFERRING PROVIDER: Bary Leriche, PA-C ? ? PT End of Session - 06/27/21 1410   ? ? Visit Number 7   ? Number of Visits 25   ? Date for PT Re-Evaluation 08/31/21   ? Authorization Type [facility]   ? PT Start Time 1410   ? PT Stop Time 5625   ? PT Time Calculation (min) 33 min   ? Equipment Utilized During Treatment Other (comment)   RW  ? Activity Tolerance Patient tolerated treatment well   ? Behavior During Therapy [facility] for tasks assessed/performed   ? ?  ?  ? ?  ? ? ? ? ? ? ? ?Past Medical History:  ?Diagnosis Date  ? DDD (degenerative disc disease), lumbar   ? Diabetes mellitus   ? Hyperlipemia   ? Hypertension   ? Obesity   ? OSA (obstructive sleep apnea)   ? on CPAP  ? ?Past Surgical History:  ?Procedure Laterality Date  ? APPLICATION OF ROBOTIC ASSISTANCE FOR SPINAL PROCEDURE N/A 01/11/2021  ? Procedure: APPLICATION OF ROBOTIC ASSISTANCE FOR SPINAL PROCEDURE;  Surgeon: Judith Part, MD;  Location: [place];  Service: Neurosurgery;  Laterality: N/A;  ? APPLICATION OF WOUND VAC N/A 01/25/2021  ? Procedure: APPLICATION OF WOUND VAC;  Surgeon: Dawley, Theodoro Doing, DO;  Location: [place];  Service: Neurosurgery;  Laterality: N/A;  ? BACK SURGERY    ? COLONOSCOPY  2019  ? LUMBAR WOUND DEBRIDEMENT N/A 01/25/2021  ? Procedure: Irrigation and Debridement of lumbar wound, and wound vacuum assisted closure;  Surgeon: Dawley, Theodoro Doing, DO;  Location: [place];  Service: Neurosurgery;  Laterality: N/A;  ? TRANSFORAMINAL LUMBAR INTERBODY FUSION (TLIF) WITH PEDICLE SCREW FIXATION 4 LEVEL N/A 01/11/2021  ? Procedure: Lumbar one-two, Lumbar two-three, Lumbar three-four, Lumbar four-five, Lumbar five Sacral one Open decompression, Transforaminal lumbar interbody fusion, posterolateral instrumented fusion;  Surgeon: Judith Part, MD;  Location: [place];  Service:  Neurosurgery;  Laterality: N/A;  ? ?Patient Active Problem List  ? Diagnosis Date Noted  ? Presence of retained hardware 06/25/2021  ? Hardware complicating wound infection ([place]) 03/13/2021  ? Medication monitoring encounter 03/13/2021  ? Slow transit constipation   ? Disto-occlusion   ? Lumbar discitis   ? Epidural abscess   ? 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 Hypoalbuminemia due to protein-calorie malnutrition ([place])   ? Acute blood loss anemia   ? Other chronic postprocedural pain   ? 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 Klebsiella pneumoniae infection 01/28/2021  ? Wound infection after surgery 01/25/2021  ? Muscle spasms of both lower extremities 01/25/2021  ? Abdominal pain 01/25/2021  ? Spleen enlarged 01/25/2021  ? Lumbar radiculopathy 01/11/2021  ? Spasticity 12/03/2020  ? Neuropathy 09/28/2020  ? Chronic pain syndrome 09/28/2020  ? Intervertebral lumbar disc disorder with myelopathy, lumbar region 09/28/2020  ? Spondylosis, cervical, with myelopathy 09/28/2020  ? Unilateral primary osteoarthritis, right knee 10/28/2019  ? PCP NOTES >>>>>>>> 07/09/2015  ? DJD (degenerative joint disease) 10/27/2014  ? Screening for heart disease 06/28/2012  ? OSA (obstructive sleep apnea) 06/14/2012  ? ED (erectile dysfunction) 04/26/2012  ? Annual physical exam 12/29/2011  ? Achilles tendinitis 09/10/2010  ? Allergic rhinitis 07/12/2010  ? Hyperlipidemia 04/26/2007  ? DM II (diabetes mellitus, type II), controlled ([place]) 04/22/2006  ? OBESITY NOS 04/22/2006  ?  Essential hypertension 04/22/2006  ? ? ?REFERRING DIAG:  ?M51.06 (ICD-10-CM) - Lumbar disc herniation with myelopathy  ? ?THERAPY DIAG:  ?Chronic bilateral low back pain, unspecified whether sciatica present ? ?Muscle weakness (generalized) ? ?PERTINENT HISTORY:  ?s/p L1-S1 decompression and instrumented fusion 01/11/21 ?Inpatient rehab: 01/18/21- 03/08/21 ?Incision and debridement of lumbar wound, placement of wound VAC 01/25/21 ?Wound care visits occurring on  Monday and Thursday ? ?PRECAUTIONS: Wound and Falls ? ?SUBJECTIVE:  ?Pt presents to PT with continued back pain. Had wound vac changed today, always has increased pain after. Pt is ready to begin PT at this time. ? ?Pain Assessment: ?Are you having pain? Yes ?NPRS scale: 8/10 ?Pain location: low back  ?PAIN TYPE: stabbing, dull  ?Pain description: constant  ?Aggravating factors: prolonged sitting  ?Relieving factors: laying supine  ? ? ? ?OBJECTIVE:  ? ?LE MMT: ?  ?MMT Right ?06/05/2021 Left ?06/05/2021  ?Hip flexion 3- 3-  ?Hip extension      ?Hip abduction      ?Hip adduction      ?Hip internal rotation      ?Hip external rotation      ?Knee flexion 4 4  ?Knee extension 4+ 4+  ?Ankle dorsiflexion 5 5  ?Ankle plantarflexion 4 4  ?Ankle inversion      ?Ankle eversion      ? (Blank rows = not tested) ?  ?FUNCTIONAL TESTS:  ?          5 x STS: 17 seconds use of BUE  ?          TUG: 25 seconds RW  ?          2 MWT: 284 ft CGA ?  ?  ?TODAY'S TREATMENT  ?[facility] Adult PT Treatment:                                                DATE: 06/27/21 ?Therapeutic Exercise: ?LAQ 2x15 5# ?Sit to stand from raised height with no UE support 4x5 ?Seated horizontal abd 2x20 GTB ?Row 3x15 Black TB ?Seated hamstring curl 2x15 Black TB ?Paloff press x 10 3# each ?Gait Training: ?Amb with no AD 165f with CGA and cues for increased trunk ext ?Neuro Re-Ed ?Tandem stance 2x30" each ? [facility]                                                [facility] ?Therapeutic Exercise: ?NuStep level 6 x 5 minutes LE/UE while taking subjective ?Sit to stand from raised height with no UE support 2x10 ?LAQ 3x10 5# each ?Seated clamshell 3x20 Black TB ?Standing in [place] 2x20 5# ?Gait Training: ?Amb with FWW 3714fwith CGA and cues for increased trunk ext ? Neuro Re-Ed ?Tandem stance 2x30" each ?Hurdle step over (5) in // - step to pattern x 2 laps ? [facility]                                                [facility]:  [facility] ?Therapeutic Exercise: ?NuStep level 6 x 5 minutes LE/UE  while taking subjective ?Sit to stand from raised height with overhead ball reach 3x5 ?LAQ 2x15 4# each ?Seated clamshell 3x20 Black TB ?Standing in FWW heel raise 4# 2x15 ?Standing in [place] march 2x20 4# ?Seated horizontal abd 2x15 GTB ?Seated row 3x15 Black TB ?Gait Training: ?Amb with FWW 331f with CGA and cues for increased trunk ext ? ?PATIENT EDUCATION:  ?Education details: n/a ?Person educated: n/a ?Education method: n/a ?Education comprehension: n/a ?  ?  ?HOME EXERCISE PROGRAM: ?Access Code: [email or web address]?URL: [email or web address] ?Date: 06/27/2021 ?Prepared by: DOctavio Manns? ?Exercises ?Seated March - 2 x daily - 7 x weekly - 3 sets - 10 reps ?Seated Hip Abduction with Resistance - 2 x daily - 7 x weekly - 3 sets - 10 reps ?Seated Long Arc Quad - 2 x daily - 7 x weekly - 3 sets - 10 reps ?Seated Heel Raise - 2 x daily - 7 x weekly - 3 sets - 10 reps ?Standing Romberg to 1/2 Tandem Stance - 1 x daily - 7 x weekly - 3 sets - 30 sec hold ?Standing Tandem Balance with Counter Support - 1 x daily - 7 x weekly - 2 reps - 30 sec hold ?  ?  ?ASSESSMENT: ?  ?CLINICAL IMPRESSION: ?Pt was able to complete all prescribed exercises with no adverse effect. Therapy today focsed on improving core and LE strength for improving mobility and safety. Trial of ambulation with no AD, pt with increased unsteadiness at longer distance. Pt continues to progress well with therapy and will continue to be seen and progressed as able.  ?  ?OBJECTIVE IMPAIRMENTS Abnormal gait, decreased activity tolerance, decreased balance, decreased endurance, decreased mobility, difficulty walking, decreased ROM, decreased strength, impaired flexibility, improper body mechanics, postural dysfunction, obesity, and pain.  ?  ?ACTIVITY LIMITATIONS cleaning, community activity, driving, meal prep, occupation, laundry, shopping, and yard work.  ?  ?PERSONAL FACTORS Age, Fitness,  and Time since onset of injury/illness/exacerbation are also affecting patient's functional outcome.  ?  ?  ?GOALS: ?Goals reviewed with patient? No ?  ?SHORT TERM GOALS: ?  ?STG Name Target Date Goal status  ?1 Patie

## 2021-06-27 ENCOUNTER — Ambulatory Visit: Payer: No Typology Code available for payment source

## 2021-06-27 ENCOUNTER — Other Ambulatory Visit: Payer: Self-pay

## 2021-06-27 ENCOUNTER — Encounter (HOSPITAL_BASED_OUTPATIENT_CLINIC_OR_DEPARTMENT_OTHER): Payer: No Typology Code available for payment source | Admitting: Internal Medicine

## 2021-06-27 DIAGNOSIS — M545 Low back pain, unspecified: Secondary | ICD-10-CM | POA: Diagnosis not present

## 2021-06-27 DIAGNOSIS — E11622 Type 2 diabetes mellitus with other skin ulcer: Secondary | ICD-10-CM | POA: Diagnosis not present

## 2021-06-27 NOTE — Progress Notes (Signed)
KIAN, OTTAVIANO ([id number]) ?Visit Report for 06/27/2021 ?SuperBill Details ?Patient Name: Date of Service: ?[place], [name] 06/27/2021 ?Medical Record Number: [id number] ?Patient Account Number: [id number] ?Date of Birth/Sex: Treating RN: ?1965-06-16 (56 y.o. 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

## 2021-06-27 NOTE — Progress Notes (Signed)
[name], [name] ([id number]) ?Visit Report for 06/27/2021 ?Arrival Information Details ?Patient Name: Date of Service: ?[name], Christian [name]. 06/27/2021 11:15 A M ?Medical Record Number: [id number] ?Patient Account Number: [id number] ?Date of Birth/Sex: Treating RN: ?1965-09-13 (56 y.o. [name] ?Primary Care Daneshia Tavano: [name] Other Clinician: ?Referring Deni Lefever: ?Treating Allayah Raineri/Extender: Kalman Shan ?[name], [place] ?Weeks in Treatment: 13 ?Visit Information History Since Last Visit ?Added or deleted any medications: No ?Patient Arrived: Wheel Chair ?Any new allergies or adverse reactions: No ?Arrival Time: 12:01 ?Had a fall or experienced change in No ?Accompanied By: son ?activities of daily living that may affect ?Transfer Assistance: None ?risk of falls: ?Patient Identification Verified: Yes ?Signs or symptoms of abuse/neglect since last visito No ?Secondary Verification Process Completed: Yes ?Hospitalized since last visit: No ?Patient Requires Transmission-Based Precautions: No ?Implantable device outside of the clinic excluding No ?Patient Has Alerts: Yes ?cellular tissue based products placed in the center ?Patient Alerts: Patient on Blood Thinner since last visit: ?No BP Right Arm-PICC Has Dressing in Place as Prescribed: Yes ?Pain Present Now: No ?Electronic Signature(s) ?Signed: 06/27/2021 5:06:01 PM By: Sharyn Creamer RN, BSN ?Entered By: Sharyn Creamer on 06/27/2021 15:38:11 ?-------------------------------------------------------------------------------- ?Encounter Discharge Information Details ?Patient Name: Date of Service: ?[name], Christian [name]. 06/27/2021 11:15 A M ?Medical Record Number: [id number] ?Patient Account Number: [id number] ?Date of Birth/Sex: Treating RN: ?1966/02/15 (56 y.o. [name] ?Primary Care Emi Lymon: [name] Other Clinician: ?Referring Xandrea Clarey: ?Treating Kian Gamarra/Extender: Kalman Shan ?[name], [place] ?Weeks in Treatment: 13 ?Encounter Discharge Information Items ?Discharge  Condition: Stable ?Ambulatory Status: Wheelchair ?Discharge Destination: Home ?Transportation: Private Auto ?Accompanied By: son ?Schedule Follow-up Appointment: Yes ?Clinical Summary of Care: Patient Declined ?Electronic Signature(s) ?Signed: 06/27/2021 5:06:01 PM By: Sharyn Creamer RN, BSN ?Entered By: Sharyn Creamer on 06/27/2021 15:37:51 ?-------------------------------------------------------------------------------- ?Negative Pressure Wound Therapy Maintenance (NPWT) Details ?Patient Name: ?Date of Service: ?[name], Christian [name]. 06/27/2021 11:15 A M ?Medical Record Number: [id number] ?Patient Account Number: [id number] ?Date of Birth/Sex: ?Treating RN: ?Nov 24, 1965 (56 y.o. [name] ?Primary Care Chelly Dombeck: [name] ?Other Clinician: ?Referring Michell Kader: ?Treating Kissie Ziolkowski/Extender: Kalman Shan ?[name], [place] ?Weeks in Treatment: 13 ?NPWT Maintenance Performed for: Wound #1 Midline Back ?Performed By: Sharyn Creamer, RN ?Type: VAC System ?Coverage Size (sq cm): 22 ?Pressure Type: Constant ?Pressure Setting: 125 mmHG ?Drain Type: None ?Primary Contact: Non-Adherent ?Sponge/Dressing Type: ?Combination : ?Date Initiated: 03/28/2021 ?Dressing Removed: Yes ?Quantity of Sponges/Gauze Removed: 3 total, 2 black foam, 1 white foam ?Canister Changed: Yes ?Canister Exudate Volume: 20 ?Dressing Reapplied: Yes ?Quantity of Sponges/Gauze Inserted: 1 white foam, 2 black foam ?Respones T Treatment: ?o tolerated well ?Days On NPWT : 92 ?Notes ?pt reported that wound started leaking under tape, states wife changed sponges and redressed/restarted vac. Rn noted no tape/skin protection under bridge, ?skin red, no breakdown to healthy skin ?Electronic Signature(s) ?Signed: 06/27/2021 5:06:01 PM By: Sharyn Creamer RN, BSN ?Entered By: Sharyn Creamer on 06/27/2021 15:42:19 ?-------------------------------------------------------------------------------- ?Patient/Caregiver Education Details ?Patient Name: ?Date of Service: ?[name], Christian RL  E. 3/16/2023andnbsp11:15 A M ?Medical Record Number: [id number] ?Patient Account Number: [id number] ?Date of Birth/Gender: ?Treating RN: ?1966-03-16 (56 y.o. [name] ?Primary Care Physician: [name] ?Other Clinician: ?Referring Physician: ?Treating Physician/Extender: Kalman Shan ?[name], [place] ?Weeks in Treatment: 13 ?Education Assessment ?Education Provided To: ?Patient and Caregiver ?Education Topics Provided ?Wound/Skin Impairment: ?Methods: Explain/Verbal ?Responses: State content correctly ?Electronic Signature(s) ?Signed: 06/27/2021 5:06:01 PM By: Sharyn Creamer RN, BSN ?Entered By: Sharyn Creamer on 06/27/2021 15:37:24 ?-------------------------------------------------------------------------------- ?Wound Assessment Details ?Patient Name: ?Date of  Service: ?[name], Christian [name]. 06/27/2021 11:15 A M ?Medical Record Number: [id number] ?Patient Account Number: [id number] ?Date of Birth/Sex: ?Treating RN: ?06-Jan-1966 (56 y.o. [name] ?Primary Care Amere Bricco: [name] ?Other Clinician: ?Referring Catalino Plascencia: ?Treating Zanyiah Posten/Extender: Kalman Shan ?[name], [place] ?Weeks in Treatment: 13 ?Wound Status ?Wound Number: 1 ?Primary Etiology: Open Surgical Wound ?Wound Location: Midline Back ?Wound Status: Open ?Wounding Event: Surgical Injury ?Comorbid History: Hypertension, Type II Diabetes, Osteomyelitis ?Date Acquired: 01/11/2021 ?Weeks Of Treatment: 13 ?Clustered Wound: No ?Wound Measurements ?Length: (cm) 11 ?Width: (cm) 2 ?Depth: (cm) 1.6 ?Area: (cm?) 17.279 ?Volume: (cm?) 27.646 ?% Reduction in Area: 77.1% ?% Reduction in Volume: 84.1% ?Wound Description ?Classification: Full Thickness Without Exposed Support Structu ?Exudate Amount: Medium ?Exudate Type: Serosanguineous ?Exudate Color: red, brown ?res ?Treatment Notes ?Wound #1 (Back) Wound Laterality: Midline ?Cleanser ?Peri-Wound Care ?Topical ?Primary Dressing ?wound VAC ?Secondary Dressing ?Secured With ?Compression Wrap ?Compression  Stockings ?Add-Ons ?Electronic Signature(s) ?Signed: 06/27/2021 5:06:01 PM By: Sharyn Creamer RN, BSN ?Entered By: Sharyn Creamer on 06/27/2021 15:36:05 ?-------------------------------------------------------------------------------- ?Vitals Details ?Patient Name: ?Date of Service: ?[name], Christian [name]. 06/27/2021 11:15 A M ?Medical Record Number: [id number] ?Patient Account Number: [id number] ?Date of Birth/Sex: ?Treating RN: ?03-Jan-1966 (56 y.o. [name] ?Primary Care Juley Giovanetti: [name] ?Other Clinician: ?Referring Avyn Aden: ?Treating Arpi Diebold/Extender: Kalman Shan ?[name], [place] ?Weeks in Treatment: 13 ?Vital Signs ?Time Taken: 12:10 ?Temperature (??F): 114/69 ?Pulse (bpm): 93 ?Respiratory Rate (breaths/min): 18 ?Blood Pressure (mmHg): 114/69 ?Reference Range: 80 - 120 mg / dl ?Electronic Signature(s) ?Signed: 06/27/2021 5:06:01 PM By: Sharyn Creamer RN, BSN ?Entered By: Sharyn Creamer on 06/27/2021 15:35:25 ?

## 2021-06-28 NOTE — Progress Notes (Signed)
[name], [name] ([id number]) ?Visit Report for 06/24/2021 ?SuperBill Details ?Patient Name: Date of Service: ?[place] 06/24/2021 ?Medical Record Number: [id number] ?Patient Account Number: [id number] ?Date of Birth/Sex: Treating RN: ?1965/07/20 (56 y.o. 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

## 2021-06-28 NOTE — Progress Notes (Signed)
Christian, Murphy ([id number]) ?Visit Report for 06/24/2021 ?Arrival Information Details ?Patient Name: Date of Service: ?[name], [name] 06/24/2021 9:30 A M ?Medical Record Number: [id number] ?Patient Account Number: [id number] ?Date of Birth/Sex: Treating RN: ?May 03, 1965 (56 y.o. M) ?Primary Care Eloise Picone: [name] Other Clinician: ?Referring Jerred Zaremba: ?Treating Scarlet Abad/Extender: Fredirick Maudlin ?[name], [place] ?Weeks in Treatment: 12 ?Visit Information History Since Last Visit ?Added or deleted any medications: No ?Patient Arrived: Wheel Chair ?Any new allergies or adverse reactions: No ?Arrival Time: 09:36 ?Had a fall or experienced change in No ?Accompanied By: friend ?activities of daily living that may affect ?Transfer Assistance: None ?risk of falls: ?Patient Identification Verified: Yes ?Signs or symptoms of abuse/neglect since last visito No ?Secondary Verification Process Completed: Yes ?Hospitalized since last visit: No ?Patient Requires Transmission-Based Precautions: No ?Implantable device outside of the clinic excluding No ?Patient Has Alerts: Yes ?cellular tissue based products placed in the center ?Patient Alerts: Patient on Blood Thinner since last visit: ?No BP Right Arm-PICC Has Dressing in Place as Prescribed: Yes ?Pain Present Now: Yes ?Electronic Signature(s) ?Signed: 06/25/2021 9:02:06 AM By: Sandre Kitty ?Entered By: Sandre Kitty on 06/24/2021 09:36:53 ?-------------------------------------------------------------------------------- ?Encounter Discharge Information Details ?Patient Name: Date of Service: ?[name], [name] 06/24/2021 9:30 A M ?Medical Record Number: [id number] ?Patient Account Number: [id number] ?Date of Birth/Sex: Treating RN: ?05/27/1965 (56 y.o. [name], [name] ?Primary Care Marene Gilliam: [name] Other Clinician: ?Referring Larone Kliethermes: ?Treating Gyselle Matthew/Extender: Fredirick Maudlin ?[name], [place] ?Weeks in Treatment: 12 ?Encounter Discharge Information Items ?Discharge Condition:  Stable ?Ambulatory Status: Wheelchair ?Discharge Destination: Home ?Transportation: Private Auto ?Accompanied By: self ?Schedule Follow-up Appointment: Yes ?Clinical Summary of Care: Patient Declined ?Electronic Signature(s) ?Signed: 06/28/2021 12:57:05 PM By: [name] RN ?Entered By: [name] on 06/24/2021 11:20:06 ?-------------------------------------------------------------------------------- ?Negative Pressure Wound Therapy Maintenance (NPWT) Details ?Patient Name: ?Date of Service: ?Music, [name] 06/24/2021 9:30 A M ?Medical Record Number: [id number] ?Patient Account Number: [id number] ?Date of Birth/Sex: ?Treating RN: ?1965-09-20 (56 y.o. [name], [name] ?Primary Care Croix Presley: [name] ?Other Clinician: ?Referring Shanya Ferriss: ?Treating Algenis Ballin/Extender: Fredirick Maudlin ?[name], [place] ?Weeks in Treatment: 12 ?NPWT Maintenance Performed for: Wound #1 Midline Back ?Performed By: [name], RN ?Type: VAC System ?Coverage Size (sq cm): 22 ?Pressure Type: Constant ?Pressure Setting: 125 mmHG ?Drain Type: None ?Primary Contact: Non-Adherent ?Sponge/Dressing Type: ?Combination : 1 white foam 1 black foam ?Date Initiated: 03/28/2021 ?Dressing Removed: No ?Quantity of Sponges/Gauze Removed: 1 white foam 1 black foam ?Canister Changed: No ?Canister Exudate Volume: 50 ?Dressing Reapplied: Yes ?Quantity of Sponges/Gauze Inserted: 1 white foam 1 black foam ?Respones T Treatment: ?o tolerated well ?Days On NPWT : 89 ?Electronic Signature(s) ?Signed: 06/28/2021 12:57:05 PM By: [name] RN ?Entered By: [name] on 06/24/2021 11:18:55 ?-------------------------------------------------------------------------------- ?Patient/Caregiver Education Details ?Patient Name: ?Date of Service: ?[name], [name] 3/13/2023andnbsp9:30 A M ?Medical Record Number: [id number] ?Patient Account Number: [id number] ?Date of Birth/Gender: ?Treating RN: ?[birth year] (56 y.o. [name], [name] ?Primary Care  Physician: [name] ?Other Clinician: ?Referring Physician: ?Treating Physician/Extender: Fredirick Maudlin ?[name], [name] ?Weeks in Treatment: 12 ?Education Assessment ?Education Provided To: ?Patient ?Education Topics Provided ?Wound/Skin Impairment: ?Methods: Explain/Verbal ?Responses: Reinforcements needed, State content correctly ?Electronic Signature(s) ?Signed: 06/28/2021 12:57:05 PM By: [name] RN ?Entered By: [name] on 06/24/2021 11:19:40 ?-------------------------------------------------------------------------------- ?Wound Assessment Details ?Patient Name: ?Date of Service: ?[name], [name] 06/24/2021 9:30 A M ?Medical Record Number: [id number] ?Patient Account Number: [id number] ?Date of Birth/Sex: ?Treating RN: ?1965/12/17 (56 y.o. M) ?Primary Care Najiyah Paris: [name] ?Other Clinician: ?  Referring Saskia Simerson: ?Treating Anaika Santillano/Extender: Fredirick Maudlin ?[name], [place] ?Weeks in Treatment: 12 ?Wound Status ?Wound Number: 1 ?Primary Etiology: Open Surgical Wound ?Wound Location: Midline Back ?Wound Status: Open ?Wounding Event: Surgical Injury ?Date Acquired: 01/11/2021 ?Weeks Of Treatment: 12 ?Clustered Wound: No ?Wound Measurements ?Length: (cm) 11 ?Width: (cm) 2 ?Depth: (cm) 1.6 ?Area: (cm?) 17.279 ?Volume: (cm?) 27.646 ?% Reduction in Area: 77.1% ?% Reduction in Volume: 84.1% ?Wound Description ?Classification: Full Thickness Without Exposed Support Structu ?Exudate Amount: Medium ?Exudate Type: Serosanguineous ?Exudate Color: red, brown ?res ?Electronic Signature(s) ?Signed: 06/25/2021 9:02:06 AM By: Sandre Kitty ?Entered By: Sandre Kitty on 06/24/2021 09:37:23 ?-------------------------------------------------------------------------------- ?Vitals Details ?Patient Name: ?Date of Service: ?[name], [name] 06/24/2021 9:30 A M ?Medical Record Number: [id number] ?Patient Account Number: [id number] ?Date of Birth/Sex: ?Treating RN: ?August 01, 1965 (56 y.o. M) ?Primary Care Inette Doubrava: [name] ?Other Clinician: ?Referring Brailey Buescher: ?Treating Maryelizabeth Eberle/Extender: Fredirick Maudlin ?[name], [place] ?Weeks in Treatment: 12 ?Vital Signs ?Time Taken: 09:36 ?Temperature (??F): 98.1 ?Pulse (bpm): 96 ?Respiratory Rate (breaths/min): 20 ?Blood Pressure (mmHg): 117/77 ?Reference Range: 80 - 120 mg / dl ?Electronic Signature(s) ?Signed: 06/25/2021 9:02:06 AM By: Sandre Kitty ?Entered By: Sandre Kitty on 06/24/2021 09:37:13 ?

## 2021-07-01 ENCOUNTER — Encounter (HOSPITAL_BASED_OUTPATIENT_CLINIC_OR_DEPARTMENT_OTHER): Payer: No Typology Code available for payment source | Admitting: Internal Medicine

## 2021-07-01 ENCOUNTER — Other Ambulatory Visit: Payer: Self-pay

## 2021-07-01 ENCOUNTER — Other Ambulatory Visit (HOSPITAL_COMMUNITY): Payer: Self-pay

## 2021-07-01 ENCOUNTER — Ambulatory Visit: Payer: No Typology Code available for payment source

## 2021-07-01 DIAGNOSIS — M869 Osteomyelitis, unspecified: Secondary | ICD-10-CM

## 2021-07-01 DIAGNOSIS — E11622 Type 2 diabetes mellitus with other skin ulcer: Secondary | ICD-10-CM | POA: Diagnosis not present

## 2021-07-01 DIAGNOSIS — R2689 Other abnormalities of gait and mobility: Secondary | ICD-10-CM

## 2021-07-01 DIAGNOSIS — L98424 Non-pressure chronic ulcer of back with necrosis of bone: Secondary | ICD-10-CM

## 2021-07-01 DIAGNOSIS — M545 Low back pain, unspecified: Secondary | ICD-10-CM | POA: Diagnosis not present

## 2021-07-01 DIAGNOSIS — M6281 Muscle weakness (generalized): Secondary | ICD-10-CM

## 2021-07-01 NOTE — Therapy (Signed)
?OUTPATIENT PHYSICAL THERAPY TREATMENT NOTE ? ? ?Patient Name: [name] ?MRN: [id number] ?DOB:1965-06-28, 56 y.o., male ?Today's Date: 07/01/2021 ? ?PCP: Colon Branch, MD ?REFERRING PROVIDER: Bary Leriche, PA-C ? ? PT End of Session - 07/01/21 1416   ? ? Visit Number 8   ? Number of Visits 25   ? Date for PT Re-Evaluation 08/31/21   ? Authorization Type [facility]   ? PT Start Time 1414   ? PT Stop Time 9518   ? PT Time Calculation (min) 43 min   ? Equipment Utilized During Treatment Other (comment)   SPC  ? Activity Tolerance Patient tolerated treatment well   ? Behavior During Therapy [facility] for tasks assessed/performed   ? ?  ?  ? ?  ? ? ? ? ? ? ? ? ?Past Medical History:  ?Diagnosis Date  ? DDD (degenerative disc disease), lumbar   ? Diabetes mellitus   ? Hyperlipemia   ? Hypertension   ? Obesity   ? OSA (obstructive sleep apnea)   ? on CPAP  ? ?Past Surgical History:  ?Procedure Laterality Date  ? APPLICATION OF ROBOTIC ASSISTANCE FOR SPINAL PROCEDURE N/A 01/11/2021  ? Procedure: APPLICATION OF ROBOTIC ASSISTANCE FOR SPINAL PROCEDURE;  Surgeon: Judith Part, MD;  Location: [place];  Service: Neurosurgery;  Laterality: N/A;  ? APPLICATION OF WOUND VAC N/A 01/25/2021  ? Procedure: APPLICATION OF WOUND VAC;  Surgeon: Dawley, Theodoro Doing, DO;  Location: [place];  Service: Neurosurgery;  Laterality: N/A;  ? BACK SURGERY    ? COLONOSCOPY  2019  ? LUMBAR WOUND DEBRIDEMENT N/A 01/25/2021  ? Procedure: Irrigation and Debridement of lumbar wound, and wound vacuum assisted closure;  Surgeon: Dawley, Theodoro Doing, DO;  Location: [place];  Service: Neurosurgery;  Laterality: N/A;  ? TRANSFORAMINAL LUMBAR INTERBODY FUSION (TLIF) WITH PEDICLE SCREW FIXATION 4 LEVEL N/A 01/11/2021  ? Procedure: Lumbar one-two, Lumbar two-three, Lumbar three-four, Lumbar four-five, Lumbar five Sacral one Open decompression, Transforaminal lumbar interbody fusion, posterolateral instrumented fusion;  Surgeon: Judith Part, MD;  Location: [place];   Service: Neurosurgery;  Laterality: N/A;  ? ?Patient Active Problem List  ? Diagnosis Date Noted  ? Presence of retained hardware 06/25/2021  ? Hardware complicating wound infection ([place]) 03/13/2021  ? Medication monitoring encounter 03/13/2021  ? Slow transit constipation   ? Disto-occlusion   ? Lumbar discitis   ? Epidural abscess   ? 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 Hypoalbuminemia due to protein-calorie malnutrition ([place])   ? Acute blood loss anemia   ? Other chronic postprocedural pain   ? 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 Klebsiella pneumoniae infection 01/28/2021  ? Wound infection after surgery 01/25/2021  ? Muscle spasms of both lower extremities 01/25/2021  ? Abdominal pain 01/25/2021  ? Spleen enlarged 01/25/2021  ? Lumbar radiculopathy 01/11/2021  ? Spasticity 12/03/2020  ? Neuropathy 09/28/2020  ? Chronic pain syndrome 09/28/2020  ? Intervertebral lumbar disc disorder with myelopathy, lumbar region 09/28/2020  ? Spondylosis, cervical, with myelopathy 09/28/2020  ? Unilateral primary osteoarthritis, right knee 10/28/2019  ? PCP NOTES >>>>>>>> 07/09/2015  ? DJD (degenerative joint disease) 10/27/2014  ? Screening for heart disease 06/28/2012  ? OSA (obstructive sleep apnea) 06/14/2012  ? ED (erectile dysfunction) 04/26/2012  ? Annual physical exam 12/29/2011  ? Achilles tendinitis 09/10/2010  ? Allergic rhinitis 07/12/2010  ? Hyperlipidemia 04/26/2007  ? DM II (diabetes mellitus, type II), controlled ([place]) 04/22/2006  ? OBESITY NOS 04/22/2006  ?  Essential hypertension 04/22/2006  ? ? ?REFERRING DIAG:  ?M51.06 (ICD-10-CM) - Lumbar disc herniation with myelopathy  ? ?THERAPY DIAG:  ?Chronic bilateral low back pain, unspecified whether sciatica present ? ?Muscle weakness (generalized) ? ?Other abnormalities of gait and mobility ? ?PERTINENT HISTORY:  ?s/p L1-S1 decompression and instrumented fusion 01/11/21 ?Inpatient rehab: 01/18/21- 03/08/21 ?Incision and debridement of lumbar wound, placement of  wound VAC 01/25/21 ?Wound care visits occurring on Monday and Thursday ? ?PRECAUTIONS: Wound and Falls ? ?SUBJECTIVE:  ?Patient reports his back pain stays around a 7-8/10. He reports doing a little bit of walking in the bedroom without the walker, but otherwise is using the RW. He continues to use the W/C for wound care appointments.  ? ?Pain Assessment: ?Are you having pain? Yes ?NPRS scale: 8/10 ?Pain location: low back  ?PAIN TYPE: stabbing, dull  ?Pain description: constant  ?Aggravating factors: prolonged sitting  ?Relieving factors: laying supine  ? ? ? ?OBJECTIVE:  ? ?LE MMT: ?  ?MMT Right ?06/05/2021 Left ?06/05/2021  ?Hip flexion 3- 3-  ?Hip extension      ?Hip abduction      ?Hip adduction      ?Hip internal rotation      ?Hip external rotation      ?Knee flexion 4 4  ?Knee extension 4+ 4+  ?Ankle dorsiflexion 5 5  ?Ankle plantarflexion 4 4  ?Ankle inversion      ?Ankle eversion      ? (Blank rows = not tested) ?  ?FUNCTIONAL TESTS:  ?          5 x STS: 17 seconds use of BUE  ?          TUG: 25 seconds RW  ?          2 MWT: 284 ft CGA ?  ?  ?TODAY'S TREATMENT  ?[facility] Adult PT Treatment:                                                DATE: 07/01/21 ?Therapeutic Exercise: ?NuStep level 5 x 5 minutes  ?Sit to stand 1 x 5 from raised height  ?Gait training: ?With SPC; CGA and W/C follow 3 x 185 ft focusing on step through pattern sequence, gait speed, and maintaining appropriate base of support; seated rest break required between bouts of gait training.  ? ? ? ?[facility] Adult PT Treatment:                                                DATE: 06/27/21 ?Therapeutic Exercise: ?LAQ 2x15 5# ?Sit to stand from raised height with no UE support 4x5 ?Seated horizontal abd 2x20 GTB ?Row 3x15 Black TB ?Seated hamstring curl 2x15 Black TB ?Paloff press x 10 3# each ?Gait Training: ?Amb with no AD 112f with CGA and cues for increased trunk ext ?Neuro Re-Ed ?Tandem stance 2x30" each ? ?[facility]                                                 [facility] ?Therapeutic Exercise: ?NuStep level  6 x 5 minutes LE/UE while taking subjective ?Sit to stand from raised height with no UE support 2x10 ?LAQ 3x10 5# each ?Seated clamshell 3x20 Black TB ?Standing in [place] march 2x20 5# ?Gait Training: ?Amb with FWW 33f with CGA and cues for increased trunk ext ? Neuro Re-Ed ?Tandem stance 2x30" each ?Hurdle step over (5) in // - step to pattern x 2 laps ? ? ?PATIENT EDUCATION:  ?Education details: n/a ?Person educated: n/a ?Education method: n/a ?Education comprehension: n/a ?  ?  ?HOME EXERCISE PROGRAM: ?Access Code: [email or web address]?URL: [email or web address] ?Date: 06/27/2021 ?Prepared by: DOctavio Manns? ?Exercises ?Seated March - 2 x daily - 7 x weekly - 3 sets - 10 reps ?Seated Hip Abduction with Resistance - 2 x daily - 7 x weekly - 3 sets - 10 reps ?Seated Long Arc Quad - 2 x daily - 7 x weekly - 3 sets - 10 reps ?Seated Heel Raise - 2 x daily - 7 x weekly - 3 sets - 10 reps ?Standing Romberg to 1/2 Tandem Stance - 1 x daily - 7 x weekly - 3 sets - 30 sec hold ?Standing Tandem Balance with Counter Support - 1 x daily - 7 x weekly - 2 reps - 30 sec hold ?  ?  ?ASSESSMENT: ?  ?CLINICAL IMPRESSION: ?Session focused on gait training today with SPC. He demonstrates overall good stability for short distances with SPC in the Lt hand, though once fatigued he demonstrates increased instability with notable scissoring of gait and difficulty sequencing appropriate pattern with cane. He requires continued emphasis on gait training with SPC, though anticipate ability to transition soon to household ambulation with SPC. It was recommended that he continue to utilize RW for all ambulation at this time. He reported fatigue at the end of the session.  ?  ?OBJECTIVE IMPAIRMENTS Abnormal gait, decreased activity tolerance, decreased balance, decreased endurance, decreased mobility, difficulty walking, decreased ROM, decreased strength, impaired  flexibility, improper body mechanics, postural dysfunction, obesity, and pain.  ?  ?ACTIVITY LIMITATIONS cleaning, community activity, driving, meal prep, occupation, laundry, shopping, and yard work.  ?  ?P

## 2021-07-01 NOTE — Progress Notes (Signed)
ALBARO, DEVINEY ([id number]) ?Visit Report for 07/01/2021 ?Chief Complaint Document Details ?Patient Name: Date of Service: ?[place], [name] 07/01/2021 9:30 A M ?Medical Record Number: [id number] ?Patient Account Number: [id number] ?Date of Birth/Sex: Treating RN: ?[birth year] (56 y.o. M) ?Primary Care Provider: [name] Other Clinician: ?Referring Provider: ?Treating Provider/Extender: Kalman Shan ?[name], [place] ?Weeks in Treatment: 13 ?Information Obtained from: Patient ?Chief Complaint ?Back wound ?Electronic Signature(s) ?Signed: 07/01/2021 10:45:25 AM By: Kalman Shan DO ?Entered By: Kalman Shan on 07/01/2021 10:41:54 ?-------------------------------------------------------------------------------- ?HPI Details ?Patient Name: Date of Service: ?[place], [name] 07/01/2021 9:30 A M ?Medical Record Number: [id number] ?Patient Account Number: [id number] ?Date of Birth/Sex: Treating RN: ?02/17/1966 (56 y.o. M) ?Primary Care Provider: [name] Other Clinician: ?Referring Provider: ?Treating Provider/Extender: Kalman Shan ?[name], [place] ?Weeks in Treatment: 13 ?History of Present Illness ?HPI Description: Admission 03/28/2021 ?Mr. Gearold Wainer is a 56 year old male with a past medical history of controlled type 2 diabetes on oral agents, obesity and OSA that presents to the clinic for a ?back wound. On 01/11/2021 patient had a laminectomy with PLIF of L1-S1 by Dr. Venetia Constable because of lumbar stenosis and radiculopathy. He subsequently ?developed bacteremia. He had CT imaging on 10/13 of the lumbar spine that showed fluid collection in the soft tissue of the posterior L1 and S1 and was taken ?to the OR for washout on 10/14. 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 He received 4 weeks of IV antibiotics by infectious ?disease. After his completion of 4 weeks of IV antibiotics he was continued for an additional 4 weeks of IV cefazolin with a stop date of 12/29. He has been ?evaluated by plastic  surgery and no plans for surgical intervention at this time. Wife is present and reports he has been on the wound VAC for the past 8 ?weeks with improvement in wound healing. He currently denies systemic signs of infection. ?12/22; patient presents for follow-up. He reports no issues since last clinic visit. He denies signs of infection. He has been tolerating the wound VAC well. ?12/30; patient presents for follow-up. He reports no issues and has no complaints today. He has been tolerating the wound VAC well. ?1/9; patient presents for follow-up. He has no issues or complaints today. He states he feels well. He has had no problems with the wound VAC. ?1/16; patient presents for follow-up. He continues to use the wound VAC with no issues. He denies signs of infection. ?1/23; patient presents for follow-up. He has been switched from IV cefazolin to oral cefadroxil by infectious disease. He has no issues or complaints today. He ?denies signs of infection. He continues to tolerate the wound VAC well. ?1/30; patient presents for follow-up. He continues to tolerate the wound VAC well. ?2/6; patient presents for follow-up. He has no issues or complaints today. He continues to tolerate the wound VAC well. He denies signs of infection. ?2/13; patient presents for follow-up. He continues to do well with the wound VAC. He denies any issues. ?2/27; patient presents for follow-up. He continues to use the wound VAC without any issues. He denies signs of infection. ?3/20; patient presents for follow-up. He has no issues or complaints today. He continues to use the wound VAC. ?Electronic Signature(s) ?Signed: 07/01/2021 10:45:25 AM By: Kalman Shan DO ?Entered By: Kalman Shan on 07/01/2021 10:42:28 ?-------------------------------------------------------------------------------- ?Physical Exam Details ?Patient Name: Date of Service: ?[place], [name] 07/01/2021 9:30 A M ?Medical Record Number: [id number] ?Patient Account  Number: [id number] ?Date  of Birth/Sex: Treating RN: ?[birth year] (56 y.o. M) ?Primary Care Provider: [name] Other Clinician: ?Referring Provider: ?Treating Provider/Extender: Kalman Shan ?[name], [place] ?Weeks in Treatment: 13 ?Constitutional ?respirations regular, non-labored and within target range for patient.Marland Kitchen ?Psychiatric ?pleasant and cooperative. ?Notes ?Large open wound to the back. Granulation tissue throughout. There is undermining and tunneling. No probing to bone. No signs of infection. ?Electronic Signature(s) ?Signed: 07/01/2021 10:45:25 AM By: Kalman Shan DO ?Entered By: Kalman Shan on 07/01/2021 10:43:14 ?-------------------------------------------------------------------------------- ?Physician Orders Details ?Patient Name: Date of Service: ?[place], [name] 07/01/2021 9:30 A M ?Medical Record Number: [id number] ?Patient Account Number: [id number] ?Date of Birth/Sex: Treating RN: ?30-Nov-1965 (56 y.o. [name], [name] ?Primary Care Provider: [name] Other Clinician: ?Referring Provider: ?Treating Provider/Extender: Kalman Shan ?[name], [place] ?Weeks in Treatment: 13 ?Verbal / Phone Orders: No ?Diagnosis Coding ?Follow-up Appointments ?ppointment in 2 weeks. - Dr. Heber [place] and Allayne Butcher Room # 9 ?Return A ?Nurse Visit: - Thursday 07/05/2021, MOnday 07/08/2021, Thursday 07/11/2021 ?Bathing/ Shower/ Hygiene ?May shower with protection but do not get wound dressing(s) wet. ?Negative Presssure Wound Therapy ?Wound Vac to wound continuously at 160m/hg pressure ?Black Foam - wound base then bridge to right side ?White Foam - T tunnel at 12:00 ?o ?Additional Orders / Instructions ?Follow Nutritious Diet - Continue to monitor blood sugars daily ?Wound Treatment ?Wound #1 - Back Wound Laterality: Midline ?Prim Dressing: wound VAC ?ary 2 x Per Week/30 Days ?Electronic Signature(s) ?Signed: 07/01/2021 10:45:25 AM By: HKalman ShanDO ?Entered By: HKalman Shanon 07/01/2021  10:43:52 ?-------------------------------------------------------------------------------- ?Problem List Details ?Patient Name: ?Date of Service: ?[place], [name] 07/01/2021 9:30 A M ?Medical Record Number: [id number]?Patient Account Number: [id number]?Date of Birth/Sex: ?Treating RN: ?306/07/1965(56y.o. 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 07/01/2021 9:30 A M ?Medical Record Number: [id number]?Patient Account Number: [id number]?Date of Birth/Sex: ?Treating RN: ?317-Nov-1967(56y.o. M) ?Primary Care Provider: PKathlene November?Other Clinician: ?Referring Provider: ?Treating Provider/Extender: HKalman Shan?[place]ose [place]?Weeks in Treatment: 13 ?Subjective ?Chief Complaint ?Information obtained from Patient ?Back wound ?History of Present Illness (HPI) ?Admission 03/28/2021 ?Mr. CKerby Borneris a 56year old male with a past medical history of controlled type 2 diabetes on oral agents, obesity and OSA that presents to the clinic for a ?back wound. On 01/11/2021 patient had a laminectomy with PLIF of L1-S1 by Dr. OVenetia Constablebecause of lumbar stenosis and radiculopathy. He subsequently ?developed bacteremia. He had CT imaging on 10/13 of the lumbar spine that showed fluid collection in the soft tissue of the  posterior L1 and S1 and was taken ?to the OR for washout on 10/14. 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 He received 4 weeks of IV antibiotics by infectious ?disease. After his completion of 4 weeks

## 2021-07-02 NOTE — Progress Notes (Signed)
[name] ([id number]) ?Visit Report for 07/01/2021 ?Arrival Information Details ?Patient Name: Date of Service: ?[name], Christian RL [name]. 07/01/2021 9:30 A M ?Medical Record Number: [id number] ?Patient Account Number: [id number] ?Date of Birth/Sex: Treating RN: ?January 31, 1966 (56 y.o. [name], [name] ?Primary Care Cross [name]: [name] Other Clinician: ?Referring [name]: ?Treating [name]/Extender: [name] ?[name], [name] ?Weeks in Treatment: 13 ?Visit Information History Since Last Visit ?Added or deleted any medications: No ?Patient Arrived: Wheel Chair ?Any new allergies or adverse reactions: No ?Arrival Time: 09:45 ?Had a fall or experienced change in No ?Accompanied By: self ?activities of daily living that may affect ?Transfer Assistance: None ?risk of falls: ?Patient Identification Verified: Yes ?Signs or symptoms of abuse/neglect since last visito No ?Secondary Verification Process Completed: Yes ?Hospitalized since last visit: No ?Patient Requires Transmission-Based Precautions: No ?Implantable device outside of the clinic excluding No ?Patient Has Alerts: Yes ?cellular tissue based products placed in the center ?Patient Alerts: Patient on Blood Thinner since last visit: ?No BP Right Arm-PICC Has Dressing in Place as Prescribed: Yes ?Pain Present Now: No ?Electronic Signature(s) ?Signed: 07/02/2021 5:22:10 PM By: Christian Hammock RN ?Entered By: [name] on 07/01/2021 09:45:31 ?-------------------------------------------------------------------------------- ?Encounter Discharge Information Details ?Patient Name: Date of Service: ?[name], Christian RL [name]. 07/01/2021 9:30 A M ?Medical Record Number: [id number] ?Patient Account Number: [id number] ?Date of Birth/Sex: Treating RN: ?1965-11-11 (56 y.o. [name], [name] ?Primary Care Devinn Voshell: [name] Other Clinician: ?Referring Sukhraj Esquivias: ?Treating Kaleth Koy/Extender: [name] ?[name], [name] ?Weeks in Treatment: 13 ?Encounter Discharge Information  Items ?Discharge Condition: Stable ?Ambulatory Status: Wheelchair ?Discharge Destination: Home ?Transportation: Private Auto ?Accompanied By: friend ?Schedule Follow-up Appointment: Yes ?Clinical Summary of Care: Patient Declined ?Electronic Signature(s) ?Signed: 07/02/2021 5:22:10 PM By: Christian Hammock RN ?Entered By: [name] on 07/01/2021 10:15:13 ?-------------------------------------------------------------------------------- ?Lower Extremity Assessment Details ?Patient Name: ?Date of Service: ?[name], Christian RL [name]. 07/01/2021 9:30 A M ?Medical Record Number: [id number] ?Patient Account Number: [id number] ?Date of Birth/Sex: ?Treating RN: ?May 23, 1965 (56 y.o. [name], [name] ?Primary Care Emiley Digiacomo: [name] ?Other Clinician: ?Referring Sharrie Self: ?Treating Karely Hurtado/Extender: [name] ?[name], [name] ?Weeks in Treatment: 13 ?Electronic Signature(s) ?Signed: 07/02/2021 5:22:10 PM By: Christian Hammock RN ?Entered By: [name] on 07/01/2021 09:45:59 ?-------------------------------------------------------------------------------- ?Multi Wound Chart Details ?Patient Name: ?Date of Service: ?[name], Christian RL [name]. 07/01/2021 9:30 A M ?Medical Record Number: [id number] ?Patient Account Number: [id number] ?Date of Birth/Sex: ?Treating RN: ?01/25/1966 (56 y.o. M) ?Primary Care Ameen Mostafa: [name] ?Other Clinician: ?Referring Rolin Schult: ?Treating Chelesa Weingartner/Extender: [name] ?[name], [place] ?Weeks in Treatment: 13 ?Vital Signs ?Height(in): ?Pulse(bpm): 90 ?Weight(lbs): ?Blood Pressure(mmHg): 78/54 ?Body Mass Index(BMI): ?Temperature(??F): 97.8 ?Respiratory Rate(breaths/min): 17 ?Photos: [N/A:N/A] ?Midline Back N/A N/A ?Wound Location: ?Surgical Injury N/A N/A ?Wounding Event: ?Open Surgical Wound N/A N/A ?Primary Etiology: ?Hypertension, Type II Diabetes, N/A N/A ?Comorbid History: ?Osteomyelitis ?01/11/2021 N/A N/A ?Date Acquired: ?27 N/A N/A ?Weeks of Treatment: ?Open N/A N/A ?Wound Status: ?No N/A N/A ?Wound  Recurrence: ?10.2x1.7x2 N/A N/A ?Measurements L x W x D (cm) ?13.619 N/A N/A ?A (cm?) : ?rea ?27.238 N/A N/A ?Volume (cm?) : ?82.00% N/A N/A ?% Reduction in A rea: ?84.30% N/A N/A ?% Reduction in Volume: ?12 ?Position 1 (o'clock): ?3.5 ?Maximum Distance 1 (cm): ?Yes N/A N/A ?Tunneling: ?Full Thickness With Exposed Support N/A N/A ?Classification: ?Structures ?Medium N/A N/A ?Exudate Amount: ?Serosanguineous N/A N/A ?Exudate Type: ?red, brown N/A N/A ?Exudate Color: ?Distinct, outline attached N/A N/A ?Wound Margin: ?Large (67-100%) N/A N/A ?Granulation Amount: ?Red, Pink N/A N/A ?Granulation Quality: ?Small (1-33%) N/A N/A ?Necrotic  Amount: ?Fat Layer (Subcutaneous Tissue): Yes N/A N/A ?Exposed Structures: ?Fascia: No ?Tendon: No ?Muscle: No ?Joint: No ?Bone: No ?Negative Pressure Wound Therapy N/A N/A ?Procedures Performed: ?Maintenance (NPWT) ?Treatment Notes ?Wound #1 (Back) Wound Laterality: Midline ?Cleanser ?Peri-Wound Care ?Topical ?Primary Dressing ?wound VAC ?Secondary Dressing ?Secured With ?Compression Wrap ?Compression Stockings ?Add-Ons ?Electronic Signature(s) ?Signed: 07/01/2021 10:45:25 AM By: Christian Shan DO ?Entered By: [name] on 07/01/2021 10:41:35 ?-------------------------------------------------------------------------------- ?Multi-Disciplinary Care Plan Details ?Patient Name: ?Date of Service: ?[name], Christian RL [name]. 07/01/2021 9:30 A M ?Medical Record Number: [id number] ?Patient Account Number: [id number] ?Date of Birth/Sex: ?Treating RN: ?Murphy 18, 1967 (56 y.o. [name], [name] ?Primary Care Lizzet Hendley: [name] ?Other Clinician: ?Referring Francena Zender: ?Treating Rei Medlen/Extender: [name] ?[name], [place] ?Weeks in Treatment: 13 ?Multidisciplinary Care Plan reviewed with physician ?Active Inactive ?Wound/Skin Impairment ?Nursing Diagnoses: ?Impaired tissue integrity ?Goals: ?Patient/caregiver will verbalize understanding of skin care regimen ?Date Initiated: 03/28/2021 ?Target Resolution  Date: 07/13/2021 ?Goal Status: Active ?Ulcer/skin breakdown will have a volume reduction of 30% by week 4 ?Date Initiated: 03/28/2021 ?Date Inactivated: 05/27/2021 ?Target Resolution Date: 05/31/2021 ?Goal Status: Met ?Interventions: ?Assess patient/caregiver ability to obtain necessary supplies ?Assess patient/caregiver ability to perform ulcer/skin care regimen upon admission and as needed ?Assess ulceration(s) every visit ?Provide education on ulcer and skin care ?Treatment Activities: ?Topical wound management initiated : 03/28/2021 ?Notes: ?Electronic Signature(s) ?Signed: 07/02/2021 5:22:10 PM By: Christian Hammock RN ?Entered By: [name] on 07/01/2021 10:09:42 ?-------------------------------------------------------------------------------- ?Negative Pressure Wound Therapy Maintenance (NPWT) Details ?Patient Name: ?Date of Service: ?[name], Christian RL [name]. 07/01/2021 9:30 A M ?Medical Record Number: [id number] ?Patient Account Number: [id number] ?Date of Birth/Sex: ?Treating RN: ?11-19-1965 (56 y.o. [name], [name] ?Primary Care Ivyrose Hashman: [name] ?Other Clinician: ?Referring Kyng Matlock: ?Treating Arslan Kier/Extender: [name] ?[name], [name] ?Weeks in Treatment: 13 ?NPWT Maintenance Performed for: Wound #1 Midline Back ?Performed By: Christian Hammock, RN ?Type: VAC System ?Coverage Size (sq cm): 17.34 ?Pressure Type: Constant ?Pressure Setting: 125 mmHG ?Drain Type: None ?Primary Contact: Non-Adherent ?Sponge/Dressing Type: ?Combination : 1 white 1 black ?Date Initiated: 03/28/2021 ?Dressing Removed: Yes ?Quantity of Sponges/Gauze Removed: 1 white 1 black ?Canister Changed: Yes ?Canister Exudate Volume: 50 ?Dressing Reapplied: Yes ?Quantity of Sponges/Gauze Inserted: 1 white 1 black ?Respones T Treatment: ?o tolerates well ?Days On NPWT : 96 ?Post Procedure Diagnosis ?Same as Pre-procedure ?Electronic Signature(s) ?Signed: 07/02/2021 5:22:10 PM By: Christian Hammock RN ?Entered By: [name] on  07/01/2021 10:14:28 ?-------------------------------------------------------------------------------- ?Pain Assessment Details ?Patient Name: ?Date of Service: ?[name], Christian RL [name]. 07/01/2021 9:30 A M ?Medical Record Number: [id number]

## 2021-07-03 ENCOUNTER — Other Ambulatory Visit (HOSPITAL_COMMUNITY): Payer: Self-pay

## 2021-07-04 ENCOUNTER — Ambulatory Visit: Payer: No Typology Code available for payment source

## 2021-07-04 ENCOUNTER — Encounter (HOSPITAL_BASED_OUTPATIENT_CLINIC_OR_DEPARTMENT_OTHER): Payer: No Typology Code available for payment source | Admitting: Internal Medicine

## 2021-07-04 ENCOUNTER — Other Ambulatory Visit: Payer: Self-pay

## 2021-07-04 DIAGNOSIS — M6281 Muscle weakness (generalized): Secondary | ICD-10-CM

## 2021-07-04 DIAGNOSIS — M545 Low back pain, unspecified: Secondary | ICD-10-CM | POA: Diagnosis not present

## 2021-07-04 DIAGNOSIS — E11622 Type 2 diabetes mellitus with other skin ulcer: Secondary | ICD-10-CM | POA: Diagnosis not present

## 2021-07-04 DIAGNOSIS — G8929 Other chronic pain: Secondary | ICD-10-CM

## 2021-07-04 NOTE — Therapy (Signed)
?OUTPATIENT PHYSICAL THERAPY TREATMENT NOTE ? ? ?Patient Name: [name] ?MRN: [id number] ?DOB:[birth year], 56 y.o., male ?Today's Date: 07/04/2021 ? ?PCP: Colon Branch, MD ?REFERRING PROVIDER: Bary Leriche, PA-C ? ? PT End of Session - 07/04/21 1407   ? ? Visit Number 9   ? Number of Visits 25   ? Date for PT Re-Evaluation 08/31/21   ? Authorization Type [facility]   ? PT Start Time 1408   ? PT Stop Time 1440   ? PT Time Calculation (min) 32 min   ? Equipment Utilized During Treatment Other (comment)   SPC  ? Activity Tolerance Patient tolerated treatment well   ? Behavior During Therapy [facility] for tasks assessed/performed   ? ?  ?  ? ?  ? ? ? ? ? ? ? ? ? ?Past Medical History:  ?Diagnosis Date  ? DDD (degenerative disc disease), lumbar   ? Diabetes mellitus   ? Hyperlipemia   ? Hypertension   ? Obesity   ? OSA (obstructive sleep apnea)   ? on CPAP  ? ?Past Surgical History:  ?Procedure Laterality Date  ? APPLICATION OF ROBOTIC ASSISTANCE FOR SPINAL PROCEDURE N/A 01/11/2021  ? Procedure: APPLICATION OF ROBOTIC ASSISTANCE FOR SPINAL PROCEDURE;  Surgeon: Judith Part, MD;  Location: [place];  Service: Neurosurgery;  Laterality: N/A;  ? APPLICATION OF WOUND VAC N/A 01/25/2021  ? Procedure: APPLICATION OF WOUND VAC;  Surgeon: Dawley, Theodoro Doing, DO;  Location: [place];  Service: Neurosurgery;  Laterality: N/A;  ? BACK SURGERY    ? COLONOSCOPY  2019  ? LUMBAR WOUND DEBRIDEMENT N/A 01/25/2021  ? Procedure: Irrigation and Debridement of lumbar wound, and wound vacuum assisted closure;  Surgeon: Dawley, Theodoro Doing, DO;  Location: [place];  Service: Neurosurgery;  Laterality: N/A;  ? TRANSFORAMINAL LUMBAR INTERBODY FUSION (TLIF) WITH PEDICLE SCREW FIXATION 4 LEVEL N/A 01/11/2021  ? Procedure: Lumbar one-two, Lumbar two-three, Lumbar three-four, Lumbar four-five, Lumbar five Sacral one Open decompression, Transforaminal lumbar interbody fusion, posterolateral instrumented fusion;  Surgeon: Judith Part, MD;  Location: [place];   Service: Neurosurgery;  Laterality: N/A;  ? ?Patient Active Problem List  ? Diagnosis Date Noted  ? Presence of retained hardware 06/25/2021  ? Hardware complicating wound infection ([place]) 03/13/2021  ? Medication monitoring encounter 03/13/2021  ? Slow transit constipation   ? Disto-occlusion   ? Lumbar discitis   ? Epidural abscess   ? 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 Hypoalbuminemia due to protein-calorie malnutrition ([place])   ? Acute blood loss anemia   ? Other chronic postprocedural pain   ? 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 Klebsiella pneumoniae infection 01/28/2021  ? Wound infection after surgery 01/25/2021  ? Muscle spasms of both lower extremities 01/25/2021  ? Abdominal pain 01/25/2021  ? Spleen enlarged 01/25/2021  ? Lumbar radiculopathy 01/11/2021  ? Spasticity 12/03/2020  ? Neuropathy 09/28/2020  ? Chronic pain syndrome 09/28/2020  ? Intervertebral lumbar disc disorder with myelopathy, lumbar region 09/28/2020  ? Spondylosis, cervical, with myelopathy 09/28/2020  ? Unilateral primary osteoarthritis, right knee 10/28/2019  ? PCP NOTES >>>>>>>> 07/09/2015  ? DJD (degenerative joint disease) 10/27/2014  ? Screening for heart disease 06/28/2012  ? OSA (obstructive sleep apnea) 06/14/2012  ? ED (erectile dysfunction) 04/26/2012  ? Annual physical exam 12/29/2011  ? Achilles tendinitis 09/10/2010  ? Allergic rhinitis 07/12/2010  ? Hyperlipidemia 04/26/2007  ? DM II (diabetes mellitus, type II), controlled ([place]) 04/22/2006  ? OBESITY NOS  04/22/2006  ? Essential hypertension 04/22/2006  ? ? ?REFERRING DIAG:  ?M51.06 (ICD-10-CM) - Lumbar disc herniation with myelopathy  ? ?THERAPY DIAG:  ?Chronic bilateral low back pain, unspecified whether sciatica present ? ?Muscle weakness (generalized) ? ?PERTINENT HISTORY:  ?s/p L1-S1 decompression and instrumented fusion 01/11/21 ?Inpatient rehab: 01/18/21- 03/08/21 ?Incision and debridement of lumbar wound, placement of wound VAC 01/25/21 ?Wound care visits  occurring on Monday and Thursday ? ?PRECAUTIONS: Wound and Falls ? ?SUBJECTIVE:  ?Pt presents to PT with continued reports of lower back discomfort. He has brought in his own [facility] today with PT assisting in setting it up. Pt has been compliant with HEP with no adverse effect. Pt is ready to begin PT treatment at this time,  ? ?Pain Assessment: ?Are you having pain? Yes ?NPRS scale: 8/10 ?Pain location: low back  ?PAIN TYPE: stabbing, dull  ?Pain description: constant  ?Aggravating factors: prolonged sitting  ?Relieving factors: laying supine  ? ? ? ?OBJECTIVE:  ? ?LE MMT: ?  ?MMT Right ?06/05/2021 Left ?06/05/2021  ?Hip flexion 3- 3-  ?Hip extension      ?Hip abduction      ?Hip adduction      ?Hip internal rotation      ?Hip external rotation      ?Knee flexion 4 4  ?Knee extension 4+ 4+  ?Ankle dorsiflexion 5 5  ?Ankle plantarflexion 4 4  ?Ankle inversion      ?Ankle eversion      ? (Blank rows = not tested) ?  ?FUNCTIONAL TESTS:  ?          5 x STS: 17 seconds use of BUE  ?          TUG: 25 seconds RW  ?          2 MWT: 284 ft CGA ?  ?  ?TODAY'S TREATMENT  ?[facility] Adult PT Treatment:                                                DATE: 07/04/2021 ?Therapeutic Exercise: ?Marching in // x 1 lap ?Lateral walk in // x 2 lap ?LAQ 2x15 5# ?Sit to stand from raised height with no UE support 4x5 ?Gait Training: ?Amb 152f with SPC and W/C follow; cues for trunk extension and decreasing gait speed ?Additonal 195fwith seated rest break in between ? ?[facility]                                                [facility] ?Therapeutic Exercise: ?NuStep level 5 x 5 minutes  ?Sit to stand 1 x 5 from raised height  ?Gait training: ?With SPC; CGA and W/C follow 3 x 185 ft focusing on step through pattern sequence, gait speed, and maintaining appropriate base of support; seated rest break required between bouts of gait training.  ? ?[facility]                                                DATE:  06/27/2021 ?Therapeutic Exercise: ?LAQ 2x15 5# ?Sit to  stand from raised height with no UE support 4x5 ?Seated horizontal abd 2x20 GTB ?Row 3x15 Black TB ?Seated hamstring curl 2x15 Black TB ?Paloff press x 10 3# each ?Gait Training: ?Amb with no AD 171f with CGA and cues for increased trunk ext ?Neuro Re-Ed ?Tandem stance 2x30" each ? ? ?PATIENT EDUCATION:  ?Education details: n/a ?Person educated: n/a ?Education method: n/a ?Education comprehension: n/a ?  ?  ?HOME EXERCISE PROGRAM: ?Access Code: [email or web address]?URL: [email or web address] ?Date: 06/27/2021 ?Prepared by: DOctavio Manns? ?Exercises ?Seated March - 2 x daily - 7 x weekly - 3 sets - 10 reps ?Seated Hip Abduction with Resistance - 2 x daily - 7 x weekly - 3 sets - 10 reps ?Seated Long Arc Quad - 2 x daily - 7 x weekly - 3 sets - 10 reps ?Seated Heel Raise - 2 x daily - 7 x weekly - 3 sets - 10 reps ?Standing Romberg to 1/2 Tandem Stance - 1 x daily - 7 x weekly - 3 sets - 30 sec hold ?Standing Tandem Balance with Counter Support - 1 x daily - 7 x weekly - 2 reps - 30 sec hold ?  ?  ?ASSESSMENT: ?  ?CLINICAL IMPRESSION: ?Pt was able to complete prescribed exercises with no change in baseline pain. Therapy today continued to progress gait and LE strength in order to improve functional endurance and safety. Pt continues to have scissoring gait pattern when he becomes fatigued. Continues to require close CGA for gait related challenges and had LoB in // when he felt his R knee buckle. Pt continues to benefit from skilled PT services and will continue to be seen and progressed as tolerated.  ?  ?OBJECTIVE IMPAIRMENTS Abnormal gait, decreased activity tolerance, decreased balance, decreased endurance, decreased mobility, difficulty walking, decreased ROM, decreased strength, impaired flexibility, improper body mechanics, postural dysfunction, obesity, and pain.  ?  ?ACTIVITY LIMITATIONS cleaning, community activity, driving, meal prep, occupation,  laundry, shopping, and yard work.  ?  ?PERSONAL FACTORS Age, Fitness, and Time since onset of injury/illness/exacerbation are also affecting patient's functional outcome.  ?  ?  ?GOALS: ?Goals reviewed with patient? No ?

## 2021-07-05 NOTE — Progress Notes (Signed)
[name], [name] ([id number]) ?Visit Report for 07/04/2021 ?Arrival Information Details ?Patient Name: Date of Service: ?[name], Christian [name]. 07/04/2021 9:30 A M ?Medical Record Number: [id number] ?Patient Account Number: [id number] ?Date of Birth/Sex: Treating RN: ?26-Feb-1966 (56 y.o. [name], [name] ?Primary Care [name]: [name] Other Clinician: ?Referring Micayla Brathwaite: ?Treating Brya Simerly/Extender: Kalman Shan ?[name], [place] ?Weeks in Treatment: 14 ?Visit Information History Since Last Visit ?Added or deleted any medications: No ?Patient Arrived: Wheel Chair ?Any new allergies or adverse reactions: No ?Arrival Time: 10:47 ?Had a fall or experienced change in No ?Accompanied By: self ?activities of daily living that may affect ?Transfer Assistance: None ?risk of falls: ?Patient Identification Verified: Yes ?Signs or symptoms of abuse/neglect since last visito No ?Secondary Verification Process Completed: Yes ?Hospitalized since last visit: No ?Patient Requires Transmission-Based Precautions: No ?Implantable device outside of the clinic excluding No ?Patient Has Alerts: Yes ?cellular tissue based products placed in the center ?Patient Alerts: Patient on Blood Thinner since last visit: ?No BP Right Arm-PICC Has Dressing in Place as Prescribed: Yes ?Pain Present Now: No ?Electronic Signature(s) ?Signed: 07/05/2021 12:10:41 PM By: [name] RN ?Entered By: [name] on 07/04/2021 10:47:21 ?-------------------------------------------------------------------------------- ?Encounter Discharge Information Details ?Patient Name: Date of Service: ?[name], Christian [name]. 07/04/2021 9:30 A M ?Medical Record Number: [id number] ?Patient Account Number: [id number] ?Date of Birth/Sex: Treating RN: ?10/20/1965 (56 y.o. [name], [name] ?Primary Care Jevante Hollibaugh: [name] Other Clinician: ?Referring Shaw Dobek: ?Treating Hosam Mcfetridge/Extender: Kalman Shan ?[name], [place] ?Weeks in Treatment: 14 ?Encounter Discharge Information  Items ?Discharge Condition: Stable ?Ambulatory Status: Ambulatory ?Discharge Destination: Home ?Transportation: Private Auto ?Accompanied By: friend ?Schedule Follow-up Appointment: Yes ?Clinical Summary of Care: Patient Declined ?Electronic Signature(s) ?Signed: 07/05/2021 12:10:41 PM By: [name] RN ?Entered By: [name] on 07/04/2021 12:48:09 ?-------------------------------------------------------------------------------- ?Negative Pressure Wound Therapy Maintenance (NPWT) Details ?Patient Name: ?Date of Service: ?[name], Christian [name]. 07/04/2021 9:30 A M ?Medical Record Number: [id number] ?Patient Account Number: [id number] ?Date of Birth/Sex: ?Treating RN: ?12/31/1965 (56 y.o. [name], [name] ?Primary Care Kylah Maresh: [name] ?Other Clinician: ?Referring Deklyn Trachtenberg: ?Treating Lilyauna Miedema/Extender: Kalman Shan ?[name], [place] ?Weeks in Treatment: 14 ?NPWT Maintenance Performed for: Wound #1 Midline Back ?Performed By: [name], RN ?Type: VAC System ?Coverage Size (sq cm): 17.34 ?Pressure Type: Constant ?Pressure Setting: 125 mmHG ?Drain Type: None ?Primary Contact: Non-Adherent ?Sponge/Dressing Type: ?Combination : ?Date Initiated: 03/28/2021 ?Dressing Removed: Yes ?Quantity of Sponges/Gauze Removed: 1 white 1 black ?Canister Changed: No ?Canister Exudate Volume: 25 ?Dressing Reapplied: Yes ?Quantity of Sponges/Gauze Inserted: 1 white 2 black ( 1 for bridge) ?Respones T Treatment: ?o tolerates well ?Days On NPWT : 99 ?Electronic Signature(s) ?Signed: 07/05/2021 12:10:41 PM By: [name] RN ?Entered By: [name] on 07/04/2021 10:49:13 ?-------------------------------------------------------------------------------- ?Patient/Caregiver Education Details ?Patient Name: ?Date of Service: ?[name], Christian [name]. 3/23/2023andnbsp9:30 A M ?Medical Record Number: [id number] ?Patient Account Number: [id number] ?Date of Birth/Gender: ?Treating RN: ?08-Feb-1966 (56 y.o. [name], [name] ?Primary  Care Physician: [name] ?Other Clinician: ?Referring Physician: ?Treating Physician/Extender: Kalman Shan ?[name], [place] ?Weeks in Treatment: 14 ?Education Assessment ?Education Provided To: ?Patient ?Education Topics Provided ?Wound/Skin Impairment: ?Methods: Explain/Verbal ?Responses: Reinforcements needed, State content correctly ?Electronic Signature(s) ?Signed: 07/05/2021 12:10:41 PM By: [name] RN ?Entered By: [name] on 07/04/2021 12:47:40 ?-------------------------------------------------------------------------------- ?Wound Assessment Details ?Patient Name: ?Date of Service: ?[name], Christian [name]. 07/04/2021 9:30 A M ?Medical Record Number: [id number] ?Patient Account Number: [id number] ?Date of Birth/Sex: ?Treating RN: ?04-Jul-1965 (56 y.o. [name], [name] ?Primary Care Jerney Baksh: [name] ?Other Clinician: ?Referring  Laprecious Austill: ?Treating Neidra Girvan/Extender: Kalman Shan ?[name], [place] ?Weeks in Treatment: 14 ?Wound Status ?Wound Number: 1 ?Primary Etiology: Open Surgical Wound ?Wound Location: Midline Back ?Wound Status: Open ?Wounding Event: Surgical Injury ?Date Acquired: 01/11/2021 ?Weeks Of Treatment: 14 ?Clustered Wound: No ?Wound Measurements ?Length: (cm) 10.2 ?Width: (cm) 1.7 ?Depth: (cm) 2 ?Area: (cm?) 13.619 ?Volume: (cm?) 27.238 ?% Reduction in Area: 82% ?% Reduction in Volume: 84.3% ?Wound Description ?Classification: Full Thickness With Exposed Support Structure ?Exudate Amount: Medium ?Exudate Type: Serosanguineous ?Exudate Color: red, brown ?s ?Treatment Notes ?Wound #1 (Back) Wound Laterality: Midline ?Cleanser ?Peri-Wound Care ?Topical ?Primary Dressing ?wound VAC ?Secondary Dressing ?Secured With ?Compression Wrap ?Compression Stockings ?Add-Ons ?Electronic Signature(s) ?Signed: 07/05/2021 12:10:41 PM By: [name] RN ?Entered By: [name] on 07/04/2021 10:48:01 ?-------------------------------------------------------------------------------- ?Vitals  Details ?Patient Name: ?Date of Service: ?[name], Christian [name]. 07/04/2021 9:30 A M ?Medical Record Number: [id number] ?Patient Account Number: [id number] ?Date of Birth/Sex: ?Treating RN: ?1966/02/17 (56 y.o. [name], [name] ?Primary Care Harjot Zavadil: [name] ?Other Clinician: ?Referring Angie Hogg: ?Treating Celine Dishman/Extender: Kalman Shan ?[name], [place] ?Weeks in Treatment: 14 ?Vital Signs ?Time Taken: 10:47 ?Temperature (??F): 97.8 ?Pulse (bpm): 74 ?Respiratory Rate (breaths/min): 17 ?Blood Pressure (mmHg): 117/74 ?Reference Range: 80 - 120 mg / dl ?Electronic Signature(s) ?Signed: 07/05/2021 12:10:41 PM By: [name] RN ?Entered By: [name] on 07/04/2021 10:47:46 ?

## 2021-07-05 NOTE — Progress Notes (Signed)
CREG, GILMER ([id number]) ?Visit Report for 07/04/2021 ?SuperBill Details ?Patient Name: Date of Service: ?[place], [place]. 07/04/2021 ?Medical Record Number: [id number] ?Patient Account Number: [id number] ?Date of Birth/Sex: Treating RN: ?1966-02-20 (56 y.o. 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

## 2021-07-08 ENCOUNTER — Encounter (HOSPITAL_BASED_OUTPATIENT_CLINIC_OR_DEPARTMENT_OTHER): Payer: No Typology Code available for payment source | Admitting: Internal Medicine

## 2021-07-08 ENCOUNTER — Ambulatory Visit: Payer: No Typology Code available for payment source

## 2021-07-08 ENCOUNTER — Other Ambulatory Visit: Payer: Self-pay

## 2021-07-08 DIAGNOSIS — M545 Low back pain, unspecified: Secondary | ICD-10-CM | POA: Diagnosis not present

## 2021-07-08 DIAGNOSIS — R2689 Other abnormalities of gait and mobility: Secondary | ICD-10-CM

## 2021-07-08 DIAGNOSIS — M6281 Muscle weakness (generalized): Secondary | ICD-10-CM

## 2021-07-08 DIAGNOSIS — E11622 Type 2 diabetes mellitus with other skin ulcer: Secondary | ICD-10-CM | POA: Diagnosis not present

## 2021-07-08 NOTE — Progress Notes (Signed)
GILDO, CRISCO ([id number]) ?Visit Report for 07/08/2021 ?Arrival Information Details ?Patient Name: Date of Service: ?[place], [name] 07/08/2021 10:15 A M ?Medical Record Number: [id number] ?Patient Account Number: [id number] ?Date of Birth/Sex: Treating RN: ?[birth year] (56 y.o. [name], [name] ?Primary Care Khala Tarte: [name] Other Clinician: ?Referring Kairen Hallinan: ?Treating Dashan Chizmar/Extender: Kalman Shan ?[name], [place] ?Weeks in Treatment: 14 ?Visit Information History Since Last Visit ?Added or deleted any medications: No ?Patient Arrived: Wheel Chair ?Any new allergies or adverse reactions: No ?Arrival Time: 10:26 ?Had a fall or experienced change in No ?Accompanied By: friend ?activities of daily living that may affect ?Transfer Assistance: None ?risk of falls: ?Patient Identification Verified: Yes ?Signs or symptoms of abuse/neglect since last visito No ?Secondary Verification Process Completed: Yes ?Hospitalized since last visit: No ?Patient Requires Transmission-Based Precautions: No ?Implantable device outside of the clinic excluding No ?Patient Has Alerts: Yes ?cellular tissue based products placed in the center ?Patient Alerts: Patient on Blood Thinner since last visit: ?No BP Right Arm-PICC Has Dressing in Place as Prescribed: Yes ?Pain Present Now: No ?Electronic Signature(s) ?Signed: 07/08/2021 4:04:41 PM By: [name] RN ?Entered By: [name] on 07/08/2021 10:53:26 ?-------------------------------------------------------------------------------- ?Encounter Discharge Information Details ?Patient Name: Date of Service: ?[place], [name] 07/08/2021 10:15 A M ?Medical Record Number: [id number] ?Patient Account Number: [id number] ?Date of Birth/Sex: Treating RN: ?16-Sep-1965 (56 y.o. [name], [name] ?Primary Care Iann Rodier: [name] Other Clinician: ?Referring Quadarius Henton: ?Treating Maveric Debono/Extender: Kalman Shan ?[name], [place] ?Weeks in Treatment: 14 ?Encounter Discharge Information  Items ?Discharge Condition: Stable ?Ambulatory Status: Wheelchair ?Discharge Destination: Home ?Transportation: Private Auto ?Accompanied By: friend ?Schedule Follow-up Appointment: Yes ?Clinical Summary of Care: Patient Declined ?Electronic Signature(s) ?Signed: 07/08/2021 4:04:41 PM By: [name] RN ?Entered By: [name] on 07/08/2021 10:55:23 ?-------------------------------------------------------------------------------- ?Negative Pressure Wound Therapy Maintenance (NPWT) Details ?Patient Name: ?Date of Service: ?[place], [name] 07/08/2021 10:15 A M ?Medical Record Number: [id number] ?Patient Account Number: [id number] ?Date of Birth/Sex: ?Treating RN: ?[birth year] (56 y.o. [name], [name] ?Primary Care Marquesa Rath: [name] ?Other Clinician: ?Referring Hydee Fleece: ?Treating Audre Cenci/Extender: Kalman Shan ?[name], [place] ?Weeks in Treatment: 14 ?NPWT Maintenance Performed for: Wound #1 Midline Back ?Performed By: [name], RN ?Type: VAC System ?Coverage Size (sq cm): 17.34 ?Pressure Type: Constant ?Pressure Setting: 125 mmHG ?Drain Type: None ?Primary Contact: Non-Adherent ?Sponge/Dressing Type: ?Combination : 1 white 1 black ?Date Initiated: 03/28/2021 ?Dressing Removed: Yes ?Quantity of Sponges/Gauze Removed: 1 white 2 black ?Canister Changed: No ?Canister Exudate Volume: 25 ?Dressing Reapplied: Yes ?Quantity of Sponges/Gauze Inserted: 1 white 2 black ( 1 for bridge) ?Respones T Treatment: ?o tolerates well ?Days On NPWT : 103 ?Electronic Signature(s) ?Signed: 07/08/2021 4:04:41 PM By: [name] RN ?Entered By: [name] on 07/08/2021 10:54:44 ?-------------------------------------------------------------------------------- ?Patient/Caregiver Education Details ?Patient Name: ?Date of Service: ?[place], [name] 3/27/2023andnbsp10:15 A M ?Medical Record Number: [id number] ?Patient Account Number: [id number] ?Date of Birth/Gender: ?Treating RN: ?[birth year] (56 y.o. [name],  [name] ?Primary Care Physician: [name] ?Other Clinician: ?Referring Physician: ?Treating Physician/Extender: Kalman Shan ?[name], [place] ?Weeks in Treatment: 14 ?Education Assessment ?Education Provided To: ?Patient ?Education Topics Provided ?Wound/Skin Impairment: ?Methods: Explain/Verbal ?Responses: Reinforcements needed, State content correctly ?Electronic Signature(s) ?Signed: 07/08/2021 4:04:41 PM By: [name] RN ?Entered By: [name] on 07/08/2021 10:55:08 ?-------------------------------------------------------------------------------- ?Wound Assessment Details ?Patient Name: ?Date of Service: ?[place], [name] 07/08/2021 10:15 A M ?Medical Record Number: [id number] ?Patient Account Number: [id number] ?Date of Birth/Sex: ?Treating RN: ?17-Aug-1965 (56 y.o. [name], [name] ?Primary Care Jayln Branscom: [name],  [place] ?Other Clinician: ?Referring Primrose Oler: ?Treating Carynn Felling/Extender: Kalman Shan ?[name], [place] ?Weeks in Treatment: 14 ?Wound Status ?Wound Number: 1 ?Primary Etiology: Open Surgical Wound ?Wound Location: Midline Back ?Wound Status: Open ?Wounding Event: Surgical Injury ?Date Acquired: 01/11/2021 ?Weeks Of Treatment: 14 ?Clustered Wound: No ?Wound Measurements ?Length: (cm) 10.2 ?Width: (cm) 1.7 ?Depth: (cm) 2 ?Area: (cm?) 13.619 ?Volume: (cm?) 27.238 ?% Reduction in Area: 82% ?% Reduction in Volume: 84.3% ?Wound Description ?Classification: Full Thickness With Exposed Support Structure ?Exudate Amount: Medium ?Exudate Type: Serosanguineous ?Exudate Color: red, brown ?s ?Treatment Notes ?Wound #1 (Back) Wound Laterality: Midline ?Cleanser ?Peri-Wound Care ?Topical ?Primary Dressing ?wound VAC ?Secondary Dressing ?Secured With ?Compression Wrap ?Compression Stockings ?Add-Ons ?Electronic Signature(s) ?Signed: 07/08/2021 4:04:41 PM By: [name] RN ?Entered By: [name] on 07/08/2021  10:53:52 ?-------------------------------------------------------------------------------- ?Vitals Details ?Patient Name: ?Date of Service: ?[place], [name] 07/08/2021 10:15 A M ?Medical Record Number: [id number] ?Patient Account Number: [id number] ?Date of Birth/Sex: ?Treating RN: ?1965-07-17 (56 y.o. [name], [name] ?Primary Care Jaziel Bennett: [name] ?Other Clinician: ?Referring Kendall Justo: ?Treating Tasheena Wambolt/Extender: Kalman Shan ?[name], [place] ?Weeks in Treatment: 14 ?Vital Signs ?Time Taken: 10:30 ?Temperature (??F): 97.7 ?Pulse (bpm): 74 ?Respiratory Rate (breaths/min): 17 ?Blood Pressure (mmHg): 124/74 ?Reference Range: 80 - 120 mg / dl ?Electronic Signature(s) ?Signed: 07/08/2021 4:04:41 PM By: [name] RN ?Entered By: [name] on 07/08/2021 10:53:42 ?

## 2021-07-08 NOTE — Progress Notes (Signed)
SHAIN, PAUWELS ([id number]) ?Visit Report for 07/08/2021 ?SuperBill Details ?Patient Name: Date of Service: ?[place], [place]. 07/08/2021 ?Medical Record Number: [id number] ?Patient Account Number: [id number] ?Date of Birth/Sex: Treating RN: ?12-Feb-1966 (56 y.o. 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

## 2021-07-08 NOTE — Therapy (Signed)
?OUTPATIENT PHYSICAL THERAPY TREATMENT NOTE ? ? ?Patient Name: [name] ?MRN: [id number] ?DOB:1965-11-22, 56 y.o., male ?Today's Date: 07/08/2021 ? ?PCP: Colon Branch, MD ?REFERRING PROVIDER: Bary Leriche, PA-C ? ? PT End of Session - 07/08/21 1417   ? ? Visit Number 10   ? Number of Visits 25   ? Date for PT Re-Evaluation 08/31/21   ? Authorization Type [facility]   ? PT Start Time 5621   ? PT Stop Time 3086   ? PT Time Calculation (min) 42 min   ? Equipment Utilized During Treatment Other (comment);Gait belt   SPC  ? Activity Tolerance Patient tolerated treatment well   ? Behavior During Therapy [facility] for tasks assessed/performed   ? ?  ?  ? ?  ? ? ? ? ? ? ? ? ? ?Past Medical History:  ?Diagnosis Date  ? DDD (degenerative disc disease), lumbar   ? Diabetes mellitus   ? Hyperlipemia   ? Hypertension   ? Obesity   ? OSA (obstructive sleep apnea)   ? on CPAP  ? ?Past Surgical History:  ?Procedure Laterality Date  ? APPLICATION OF ROBOTIC ASSISTANCE FOR SPINAL PROCEDURE N/A 01/11/2021  ? Procedure: APPLICATION OF ROBOTIC ASSISTANCE FOR SPINAL PROCEDURE;  Surgeon: Judith Part, MD;  Location: [place];  Service: Neurosurgery;  Laterality: N/A;  ? APPLICATION OF WOUND VAC N/A 01/25/2021  ? Procedure: APPLICATION OF WOUND VAC;  Surgeon: Dawley, Theodoro Doing, DO;  Location: [place];  Service: Neurosurgery;  Laterality: N/A;  ? BACK SURGERY    ? COLONOSCOPY  2019  ? LUMBAR WOUND DEBRIDEMENT N/A 01/25/2021  ? Procedure: Irrigation and Debridement of lumbar wound, and wound vacuum assisted closure;  Surgeon: Dawley, Theodoro Doing, DO;  Location: [place];  Service: Neurosurgery;  Laterality: N/A;  ? TRANSFORAMINAL LUMBAR INTERBODY FUSION (TLIF) WITH PEDICLE SCREW FIXATION 4 LEVEL N/A 01/11/2021  ? Procedure: Lumbar one-two, Lumbar two-three, Lumbar three-four, Lumbar four-five, Lumbar five Sacral one Open decompression, Transforaminal lumbar interbody fusion, posterolateral instrumented fusion;  Surgeon: Judith Part, MD;  Location:  [place];  Service: Neurosurgery;  Laterality: N/A;  ? ?Patient Active Problem List  ? Diagnosis Date Noted  ? Presence of retained hardware 06/25/2021  ? Hardware complicating wound infection ([place]) 03/13/2021  ? Medication monitoring encounter 03/13/2021  ? Slow transit constipation   ? Disto-occlusion   ? Lumbar discitis   ? Epidural abscess   ? 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 Hypoalbuminemia due to protein-calorie malnutrition ([place])   ? Acute blood loss anemia   ? Other chronic postprocedural pain   ? 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 Klebsiella pneumoniae infection 01/28/2021  ? Wound infection after surgery 01/25/2021  ? Muscle spasms of both lower extremities 01/25/2021  ? Abdominal pain 01/25/2021  ? Spleen enlarged 01/25/2021  ? Lumbar radiculopathy 01/11/2021  ? Spasticity 12/03/2020  ? Neuropathy 09/28/2020  ? Chronic pain syndrome 09/28/2020  ? Intervertebral lumbar disc disorder with myelopathy, lumbar region 09/28/2020  ? Spondylosis, cervical, with myelopathy 09/28/2020  ? Unilateral primary osteoarthritis, right knee 10/28/2019  ? PCP NOTES >>>>>>>> 07/09/2015  ? DJD (degenerative joint disease) 10/27/2014  ? Screening for heart disease 06/28/2012  ? OSA (obstructive sleep apnea) 06/14/2012  ? ED (erectile dysfunction) 04/26/2012  ? Annual physical exam 12/29/2011  ? Achilles tendinitis 09/10/2010  ? Allergic rhinitis 07/12/2010  ? Hyperlipidemia 04/26/2007  ? DM II (diabetes mellitus, type II), controlled ([place]) 04/22/2006  ? OBESITY  NOS 04/22/2006  ? Essential hypertension 04/22/2006  ? ? ?REFERRING DIAG:  ?M51.06 (ICD-10-CM) - Lumbar disc herniation with myelopathy  ? ?THERAPY DIAG:  ?Chronic bilateral low back pain, unspecified whether sciatica present ? ?Muscle weakness (generalized) ? ?Other abnormalities of gait and mobility ? ?PERTINENT HISTORY:  ?s/p L1-S1 decompression and instrumented fusion 01/11/21 ?Inpatient rehab: 01/18/21- 03/08/21 ?Incision and debridement of lumbar wound,  placement of wound VAC 01/25/21 ?Wound care visits occurring on Monday and Thursday ? ?PRECAUTIONS: Wound and Falls ? ?SUBJECTIVE:  ?Patient reports he is doing good. He has been using his [facility] for walking just around the bedroom at home.  ? ?Pain Assessment: ?Are you having pain? Yes ?NPRS scale: 7/10 ?Pain location: low back  ?PAIN TYPE: stabbing, dull  ?Pain description: constant  ?Aggravating factors: prolonged sitting  ?Relieving factors: laying supine  ? ? ? ?OBJECTIVE:  ? ?LE MMT: ?  ?MMT Right ?06/05/2021 Left ?06/05/2021  ?Hip flexion 3- 3-  ?Hip extension      ?Hip abduction      ?Hip adduction      ?Hip internal rotation      ?Hip external rotation      ?Knee flexion 4 4  ?Knee extension 4+ 4+  ?Ankle dorsiflexion 5 5  ?Ankle plantarflexion 4 4  ?Ankle inversion      ?Ankle eversion      ? (Blank rows = not tested) ?  ?FUNCTIONAL TESTS:  ?          5 x STS: 17 seconds use of BUE  ?          TUG: 25 seconds RW  ?          2 MWT: 284 ft CGA ?  ?  ?TODAY'S TREATMENT  ?[facility] Adult PT Treatment:                                                DATE: 07/08/21 ?Therapeutic Exercise: ?Seated hamstring curl 2 x 15; black band ?LAQ 2 x 10; 6 lbs ? ?Gait training: ?370 ft with SPC began as CGA to SBA with W/C follow; focusing on arm swing and upright posture; 3 rounds; seated rest break between rounds ? ?Self-Care:  ?- fall prevention checklist for home  ? ? ?[facility] Adult PT Treatment:                                                DATE: 07/04/2021 ?Therapeutic Exercise: ?Marching in // x 1 lap ?Lateral walk in // x 2 lap ?LAQ 2x15 5# ?Sit to stand from raised height with no UE support 4x5 ?Gait Training: ?Amb 1105f with SPC and W/C follow; cues for trunk extension and decreasing gait speed ?Additonal 1949fwith seated rest break in between ? ?[facility]                                                [facility] ?Therapeutic Exercise: ?NuStep level 5 x 5 minutes  ?Sit to stand 1 x 5 from raised height  ?Gait  training: ?[facility]  and W/C follow 3 x 185 ft focusing on step through pattern sequence, gait speed, and maintaining appropriate base of support; seated rest break required between bouts of gait training.  ? ? ?PATIENT EDUCATION:  ?Education details: utilized [facility] for household ambulation ?Person educated: patient ?Education method: instruction ?Education comprehension: verbalized understanding  ?  ?  ?HOME EXERCISE PROGRAM: ?Access Code: [place] ?URL: [email or web address] ?Date: 06/27/2021 ?Prepared by: Octavio Manns ? ?Exercises ?Seated March - 2 x daily - 7 x weekly - 3 sets - 10 reps ?Seated Hip Abduction with Resistance - 2 x daily - 7 x weekly - 3 sets - 10 reps ?Seated Long Arc Quad - 2 x daily - 7 x weekly - 3 sets - 10 reps ?Seated Heel Raise - 2 x daily - 7 x weekly - 3 sets - 10 reps ?Standing Romberg to 1/2 Tandem Stance - 1 x daily - 7 x weekly - 3 sets - 30 sec hold ?Standing Tandem Balance with Counter Support - 1 x daily - 7 x weekly - 2 reps - 30 sec hold ?  ?  ?ASSESSMENT: ?  ?CLINICAL IMPRESSION: ?Session focused on gait training with SPC, which he tolerated well demonstrating an overall improvement in endurance,balance, and no increased LBP. He demonstrates improved gait stability with no LOB, normalized step length and width, and normal gait speed for ambulating household distances today in clinic. He has postural abnormalities and limited trunk mobility/ arm swing during ambulation that is modestly improved with cues. At this time it was recommended that he can begin household ambulation with [facility] as tolerated with patient verbalizing understanding.  ?  ?OBJECTIVE IMPAIRMENTS Abnormal gait, decreased activity tolerance, decreased balance, decreased endurance, decreased mobility, difficulty walking, decreased ROM, decreased strength, impaired flexibility, improper body mechanics, postural dysfunction, obesity, and pain.  ?  ?ACTIVITY LIMITATIONS cleaning, community activity,  driving, meal prep, occupation, laundry, shopping, and yard work.  ?  ?PERSONAL FACTORS Age, Fitness, and Time since onset of injury/illness/exacerbation are also affecting patient's functional outcome.  ?  ?

## 2021-07-09 ENCOUNTER — Other Ambulatory Visit (HOSPITAL_COMMUNITY): Payer: Self-pay

## 2021-07-09 ENCOUNTER — Other Ambulatory Visit: Payer: Self-pay | Admitting: Physical Medicine and Rehabilitation

## 2021-07-09 ENCOUNTER — Encounter: Payer: Self-pay | Admitting: Plastic Surgery

## 2021-07-09 MED ORDER — MORPHINE SULFATE ER 15 MG PO TBCR
30.0000 mg | EXTENDED_RELEASE_TABLET | Freq: Two times a day (BID) | ORAL | 0 refills | Status: DC
  Filled 2021-07-09: qty 60, 15d supply, fill #0

## 2021-07-10 ENCOUNTER — Encounter: Payer: Self-pay | Admitting: Physical Medicine and Rehabilitation

## 2021-07-11 ENCOUNTER — Encounter (HOSPITAL_BASED_OUTPATIENT_CLINIC_OR_DEPARTMENT_OTHER): Payer: No Typology Code available for payment source | Admitting: Internal Medicine

## 2021-07-11 ENCOUNTER — Ambulatory Visit: Payer: No Typology Code available for payment source

## 2021-07-11 DIAGNOSIS — M6281 Muscle weakness (generalized): Secondary | ICD-10-CM

## 2021-07-11 DIAGNOSIS — M545 Low back pain, unspecified: Secondary | ICD-10-CM | POA: Diagnosis not present

## 2021-07-11 DIAGNOSIS — E11622 Type 2 diabetes mellitus with other skin ulcer: Secondary | ICD-10-CM | POA: Diagnosis not present

## 2021-07-11 DIAGNOSIS — G8929 Other chronic pain: Secondary | ICD-10-CM

## 2021-07-11 NOTE — Progress Notes (Signed)
[name], [name] ([id number]) ?Visit Report for 07/11/2021 ?Arrival Information Details ?Patient Name: Date of Service: ?[name], [name] 07/11/2021 12:30 PM ?Medical Record Number: [id number] ?Patient Account Number: [id number] ?Date of Birth/Sex: Treating RN: ?Sep 10, 1965 (56 y.o. M) [name], [name] ?Primary Care Zyrell Carmean: [name] Other Clinician: ?Referring Cristiano Capri: ?Treating Jakwan Sally/Extender: Kalman Shan ?[name], [place] ?Weeks in Treatment: 15 ?Visit Information History Since Last Visit ?Added or deleted any medications: No ?Patient Arrived: Wheel Chair ?Any new allergies or adverse reactions: No ?Arrival Time: 12:35 ?Had a fall or experienced change in No ?Accompanied By: self ?activities of daily living that may affect ?Transfer Assistance: None ?risk of falls: ?Patient Identification Verified: Yes ?Signs or symptoms of abuse/neglect since last visito No ?Secondary Verification Process Completed: Yes ?Hospitalized since last visit: No ?Patient Requires Transmission-Based Precautions: No ?Implantable device outside of the clinic excluding No ?Patient Has Alerts: Yes ?cellular tissue based products placed in the center ?Patient Alerts: Patient on Blood Thinner since last visit: ?No BP Right Arm-PICC Has Dressing in Place as Prescribed: Yes ?Pain Present Now: No ?Electronic Signature(s) ?Signed: 07/11/2021 4:54:01 PM By: Deon Pilling RN, BSN ?Entered By: Deon Pilling on 07/11/2021 13:59:37 ?-------------------------------------------------------------------------------- ?Encounter Discharge Information Details ?Patient Name: Date of Service: ?[name], [name] 07/11/2021 12:30 PM ?Medical Record Number: [id number] ?Patient Account Number: [id number] ?Date of Birth/Sex: Treating RN: ?[birth year] (56 y.o. M) [name], [name] ?Primary Care Emersyn Kotarski: [name] Other Clinician: ?Referring Handsome Anglin: ?Treating Chantille Navarrete/Extender: Kalman Shan ?[name], [place] ?Weeks in Treatment: 15 ?Encounter Discharge Information Items ?Discharge  Condition: Stable ?Ambulatory Status: Wheelchair ?Discharge Destination: Home ?Transportation: Private Auto ?Accompanied By: caregiver ?Schedule Follow-up Appointment: Yes ?Clinical Summary of Care: ?Electronic Signature(s) ?Signed: 07/11/2021 4:54:01 PM By: Deon Pilling RN, BSN ?Entered By: Deon Pilling on 07/11/2021 14:03:11 ?-------------------------------------------------------------------------------- ?Negative Pressure Wound Therapy Maintenance (NPWT) Details ?Patient Name: ?Date of Service: ?[name], [name] 07/11/2021 12:30 PM ?Medical Record Number: [id number] ?Patient Account Number: [id number] ?Date of Birth/Sex: ?Treating RN: ?April 13, 1966 (56 y.o. M) [name], [name] ?Primary Care Miosha Behe: [name] ?Other Clinician: ?Referring Diesel Lina: ?Treating Cohl Behrens/Extender: Kalman Shan ?[name], [place] ?Weeks in Treatment: 15 ?NPWT Maintenance Performed for: Wound #1 Midline Back ?Performed By: Deon Pilling, RN ?Type: VAC System ?Coverage Size (sq cm): 17.34 ?Pressure Type: Constant ?Pressure Setting: 125 mmHG ?Drain Type: None ?Primary Contact: Non-Adherent ?Sponge/Dressing Type: ?Combination : x1 white foam and x1 black foam ?Date Initiated: 03/28/2021 ?Dressing Removed: Yes ?Quantity of Sponges/Gauze Removed: x1 white foam and x1 black foam ?Canister Changed: Yes ?Canister Exudate Volume: 50 ?Dressing Reapplied: Yes ?Quantity of Sponges/Gauze Inserted: x1 white foam; x1 black foam bridged to right side ?Respones T Treatment: ?o tolerated well ?Days On NPWT : 106 ?Electronic Signature(s) ?Signed: 07/11/2021 4:54:01 PM By: Deon Pilling RN, BSN ?Entered By: Deon Pilling on 07/11/2021 14:01:48 ?-------------------------------------------------------------------------------- ?Patient/Caregiver Education Details ?Patient Name: ?Date of Service: ?[name], [name] 3/30/2023andnbsp12:30 PM ?Medical Record Number: [id number] ?Patient Account Number: [id number] ?Date of Birth/Gender: ?Treating RN: ?[birth year] (56 y.o. M) [name],  [name] ?Primary Care Physician: [name] ?Other Clinician: ?Referring Physician: ?Treating Physician/Extender: Kalman Shan ?[name], [place] ?Weeks in Treatment: 15 ?Education Assessment ?Education Provided To: ?Patient ?Education Topics Provided ?Wound/Skin Impairment: ?Handouts: Skin Care Do's and Dont's ?Methods: Explain/Verbal ?Responses: Reinforcements needed ?Electronic Signature(s) ?Signed: 07/11/2021 4:54:01 PM By: Deon Pilling RN, BSN ?Entered By: Deon Pilling on 07/11/2021 14:02:14 ?-------------------------------------------------------------------------------- ?Wound Assessment Details ?Patient Name: ?Date of Service: ?[name], [name] 07/11/2021 12:30 PM ?Medical Record Number: [id number] ?Patient Account Number: [id number] ?Date of Birth/Sex: ?Treating RN: ?1966/02/06 (56  y.o. M) [name], [name] ?Primary Care Amarion Portell: [name] ?Other Clinician: ?Referring Keary Waterson: ?Treating Arien Benincasa/Extender: Kalman Shan ?[name], [place] ?Weeks in Treatment: 15 ?Wound Status ?Wound Number: 1 ?Primary Etiology: Open Surgical Wound ?Wound Location: Midline Back ?Wound Status: Open ?Wounding Event: Surgical Injury ?Date Acquired: 01/11/2021 ?Weeks Of Treatment: 15 ?Clustered Wound: No ?Wound Measurements ?Length: (cm) 10.2 ?Width: (cm) 1.7 ?Depth: (cm) 2 ?Area: (cm?) 13.619 ?Volume: (cm?) 27.238 ?% Reduction in Area: 82% ?% Reduction in Volume: 84.3% ?Wound Description ?Classification: Full Thickness With Exposed Support Structures ?Exudate Amount: Medium ?Exudate Type: Serosanguineous ?Exudate Color: red, brown ?Treatment Notes ?Wound #1 (Back) Wound Laterality: Midline ?Cleanser ?Peri-Wound Care ?Topical ?Primary Dressing ?wound VAC ?Secondary Dressing ?Secured With ?Compression Wrap ?Compression Stockings ?Add-Ons ?Electronic Signature(s) ?Signed: 07/11/2021 4:54:01 PM By: Deon Pilling RN, BSN ?Entered By: Deon Pilling on 07/11/2021  14:00:24 ?-------------------------------------------------------------------------------- ?Vitals Details ?Patient Name: ?Date of Service: ?[name], [name] 07/11/2021 12:30 PM ?Medical Record Number: [id number] ?Patient Account Number: [id number] ?Date of Birth/Sex: ?Treating RN: ?04-Feb-1966 (56 y.o. M) [name], [name] ?Primary Care Emmett Arntz: [name] ?Other Clinician: ?Referring Tycho Cheramie: ?Treating Dalon Reichart/Extender: Kalman Shan ?[name], [place] ?Weeks in Treatment: 15 ?Vital Signs ?Time Taken: 12:35 ?Temperature (??F): 98 ?Pulse (bpm): 88 ?Respiratory Rate (breaths/min): 20 ?Blood Pressure (mmHg): 144/80 ?Reference Range: 80 - 120 mg / dl ?Electronic Signature(s) ?Signed: 07/11/2021 4:54:01 PM By: Deon Pilling RN, BSN ?Entered By: Deon Pilling on 07/11/2021 14:00:09 ?

## 2021-07-11 NOTE — Therapy (Signed)
?OUTPATIENT PHYSICAL THERAPY TREATMENT NOTE ? ? ?Patient Name: [name] ?MRN: [id number] ?DOB:1966-02-02, 57 y.o., male ?Today's Date: 07/11/2021 ? ?PCP: Colon Branch, MD ?REFERRING PROVIDER: Bary Leriche, PA-C ? ? PT End of Session - 07/11/21 1400   ? ? Visit Number 11   ? Number of Visits 25   ? Date for PT Re-Evaluation 08/31/21   ? Authorization Type [facility]   ? PT Start Time 1400   ? PT Stop Time 7829   ? PT Time Calculation (min) 42 min   ? Equipment Utilized During Treatment Other (comment);Gait belt   SPC  ? Activity Tolerance Patient tolerated treatment well   ? Behavior During Therapy [facility] for tasks assessed/performed   ? ?  ?  ? ?  ? ? ? ? ? ? ? ? ? ? ?Past Medical History:  ?Diagnosis Date  ? DDD (degenerative disc disease), lumbar   ? Diabetes mellitus   ? Hyperlipemia   ? Hypertension   ? Obesity   ? OSA (obstructive sleep apnea)   ? on CPAP  ? ?Past Surgical History:  ?Procedure Laterality Date  ? APPLICATION OF ROBOTIC ASSISTANCE FOR SPINAL PROCEDURE N/A 01/11/2021  ? Procedure: APPLICATION OF ROBOTIC ASSISTANCE FOR SPINAL PROCEDURE;  Surgeon: Judith Part, MD;  Location: [place];  Service: Neurosurgery;  Laterality: N/A;  ? APPLICATION OF WOUND VAC N/A 01/25/2021  ? Procedure: APPLICATION OF WOUND VAC;  Surgeon: Dawley, Theodoro Doing, DO;  Location: [place];  Service: Neurosurgery;  Laterality: N/A;  ? BACK SURGERY    ? COLONOSCOPY  2019  ? LUMBAR WOUND DEBRIDEMENT N/A 01/25/2021  ? Procedure: Irrigation and Debridement of lumbar wound, and wound vacuum assisted closure;  Surgeon: Dawley, Theodoro Doing, DO;  Location: [place];  Service: Neurosurgery;  Laterality: N/A;  ? TRANSFORAMINAL LUMBAR INTERBODY FUSION (TLIF) WITH PEDICLE SCREW FIXATION 4 LEVEL N/A 01/11/2021  ? Procedure: Lumbar one-two, Lumbar two-three, Lumbar three-four, Lumbar four-five, Lumbar five Sacral one Open decompression, Transforaminal lumbar interbody fusion, posterolateral instrumented fusion;  Surgeon: Judith Part, MD;  Location:  [place];  Service: Neurosurgery;  Laterality: N/A;  ? ?Patient Active Problem List  ? Diagnosis Date Noted  ? Presence of retained hardware 06/25/2021  ? Hardware complicating wound infection ([place]) 03/13/2021  ? Medication monitoring encounter 03/13/2021  ? Slow transit constipation   ? Disto-occlusion   ? Lumbar discitis   ? Epidural abscess   ? 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 Hypoalbuminemia due to protein-calorie malnutrition ([place])   ? Acute blood loss anemia   ? Other chronic postprocedural pain   ? 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 Klebsiella pneumoniae infection 01/28/2021  ? Wound infection after surgery 01/25/2021  ? Muscle spasms of both lower extremities 01/25/2021  ? Abdominal pain 01/25/2021  ? Spleen enlarged 01/25/2021  ? Lumbar radiculopathy 01/11/2021  ? Spasticity 12/03/2020  ? Neuropathy 09/28/2020  ? Chronic pain syndrome 09/28/2020  ? Intervertebral lumbar disc disorder with myelopathy, lumbar region 09/28/2020  ? Spondylosis, cervical, with myelopathy 09/28/2020  ? Unilateral primary osteoarthritis, right knee 10/28/2019  ? PCP NOTES >>>>>>>> 07/09/2015  ? DJD (degenerative joint disease) 10/27/2014  ? Screening for heart disease 06/28/2012  ? OSA (obstructive sleep apnea) 06/14/2012  ? ED (erectile dysfunction) 04/26/2012  ? Annual physical exam 12/29/2011  ? Achilles tendinitis 09/10/2010  ? Allergic rhinitis 07/12/2010  ? Hyperlipidemia 04/26/2007  ? DM II (diabetes mellitus, type II), controlled ([place]) 04/22/2006  ?  OBESITY NOS 04/22/2006  ? Essential hypertension 04/22/2006  ? ? ?REFERRING DIAG:  ?M51.06 (ICD-10-CM) - Lumbar disc herniation with myelopathy  ? ?THERAPY DIAG:  ?Chronic bilateral low back pain, unspecified whether sciatica present ? ?Muscle weakness (generalized) ? ?PERTINENT HISTORY:  ?s/p L1-S1 decompression and instrumented fusion 01/11/21 ?Inpatient rehab: 01/18/21- 03/08/21 ?Incision and debridement of lumbar wound, placement of wound VAC 01/25/21 ?Wound care visits  occurring on Monday and Thursday ? ?PRECAUTIONS: Wound and Falls ? ?SUBJECTIVE:  ?Pt presents to PT with continued lower back pain, had wound vac changed earlier. Has been compliant with HEP and SPC use at home which is going well. Pt is ready to begin PT at this time.  ? ?Pain Assessment: ?Are you having pain? Yes ?NPRS scale: 7/10 ?Pain location: low back  ?PAIN TYPE: stabbing, dull  ?Pain description: constant  ?Aggravating factors: prolonged sitting  ?Relieving factors: laying supine  ? ? ? ?OBJECTIVE:  ? ?LE MMT: ?  ?MMT Right ?06/05/2021 Left ?06/05/2021  ?Hip flexion 3- 3-  ?Hip extension      ?Hip abduction      ?Hip adduction      ?Hip internal rotation      ?Hip external rotation      ?Knee flexion 4 4  ?Knee extension 4+ 4+  ?Ankle dorsiflexion 5 5  ?Ankle plantarflexion 4 4  ?Ankle inversion      ?Ankle eversion      ? (Blank rows = not tested) ?  ?FUNCTIONAL TESTS:  ?          5 x STS: 17 seconds use of BUE  ?          TUG: 25 seconds RW  ?          2 MWT: 284 ft CGA ?  ?  ?TODAY'S TREATMENT  ?[facility] Adult PT Treatment:                                                DATE: 07/11/2021 ?Therapeutic Exercise: ?Marching in // x 1 lap ?Standing hip abd/ext x 10 each in // ?Tandem stance 2x30" each ?Sit to stand from raised height with no UE support 3x10 ?LAQ 2x10 6# ?Seated march 2x20 6# ?Gait Training: ?Amb 391f x 2 with SPC and W/C follow; cues for trunk extension and decreasing gait speed ?Amb up/down 4 stairs with SPC and 1 handhold assist - Step through up, step to down ? [facility]                                                [facility]: 07[facility]08[facility]21 ?Therapeutic Exercise: ?Seated hamstring curl 2 x 15; black band ?LAQ 2 x 10; 6 lbs ? ?Gait training: ?370 ft with SPC began as CGA to SBA with W/C follow; focusing on arm swing and upright posture; 3 rounds; seated rest break between rounds ? ?Self-Care:  ?- fall prevention checklist for home  ? ? [facility]  [facility]20[facility] ?Therapeutic Exercise: ?Marching in // x 1 lap ?Lateral walk in // x 2 lap ?LAQ 2x15 5# ?Sit to stand from raised height with no UE support 4x5 ?Gait Training: ?Amb 135f with SPC and W/C follow; cues for trunk extension and decreasing gait speed ?Additonal 1941fwith seated rest break in between ? ?[facility]                                                [facility]: 07[facility]/2021 ?Therapeutic Exercise: ?NuStep level 5 x 5 minutes  ?Sit to stand 1 x 5 from raised height  ?Gait training: ?With SPC; CGA and W/C follow 3 x 185 ft focusing on step through pattern sequence, gait speed, and maintaining appropriate base of support; seated rest break required between bouts of gait training.  ? ? ?PATIENT EDUCATION:  ?Education details: utilized [facility] ambulation ?Person educated: patient ?Education method: instruction ?Education comprehension: verbalized understanding  ?  ?  ?HOME EXERCISE PROGRAM: ?Access Code: [email or web address]: [email or web address] ?Date: 06/27/2021 ?Prepared by: DaOctavio Manns ?Exercises ?Seated March - 2 x daily - 7 x weekly - 3 sets - 10 reps ?Seated Hip Abduction with Resistance - 2 x daily - 7 x weekly - 3 sets - 10 reps ?Seated Long Arc Quad - 2 x daily - 7 x weekly - 3 sets - 10 reps ?Seated Heel Raise - 2 x daily - 7 x weekly - 3 sets - 10 reps ?Standing Romberg to 1/2 Tandem Stance - 1 x daily - 7 x weekly - 3 sets - 30 sec hold ?Standing Tandem Balance with Counter Support - 1 x daily - 7 x weekly - 2 reps - 30 sec hold ?  ?  ?ASSESSMENT: ?  ?CLINICAL IMPRESSION: ?Pt was again able to complete all prescribed exercises with no adverse effect or increase in pain. He again was able to improve gait endurance and functional mobility. Pt continues to benefit form skilled PT services and will continue to be seen and progressed as able.  ?  ?OBJECTIVE IMPAIRMENTS Abnormal gait, decreased activity tolerance, decreased balance, decreased  endurance, decreased mobility, difficulty walking, decreased ROM, decreased strength, impaired flexibility, improper body mechanics, postural dysfunction, obesity, and pain.  ?  ?ACTIVITY LIMITATIONS cleani

## 2021-07-11 NOTE — Progress Notes (Signed)
RIZWAN, KUYPER ([id number]) ?Visit Report for 07/11/2021 ?SuperBill Details ?Patient Name: Date of Service: ?[name], [name]. 07/11/2021 ?Medical Record Number: [id number] ?Patient Account Number: [id number] ?Date of Birth/Sex: Treating RN: ?03-May-1965 (56 y.o. 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

## 2021-07-12 ENCOUNTER — Other Ambulatory Visit (HOSPITAL_COMMUNITY): Payer: Self-pay

## 2021-07-12 MED ORDER — HYDROMORPHONE HCL 2 MG PO TABS
2.0000 mg | ORAL_TABLET | ORAL | 0 refills | Status: DC
  Filled 2021-07-12: qty 28, 66d supply, fill #0

## 2021-07-15 ENCOUNTER — Encounter (HOSPITAL_BASED_OUTPATIENT_CLINIC_OR_DEPARTMENT_OTHER): Payer: No Typology Code available for payment source | Attending: Internal Medicine | Admitting: Internal Medicine

## 2021-07-15 ENCOUNTER — Ambulatory Visit: Payer: No Typology Code available for payment source | Attending: Physical Medicine and Rehabilitation

## 2021-07-15 DIAGNOSIS — M869 Osteomyelitis, unspecified: Secondary | ICD-10-CM | POA: Diagnosis not present

## 2021-07-15 DIAGNOSIS — L98424 Non-pressure chronic ulcer of back with necrosis of bone: Secondary | ICD-10-CM | POA: Insufficient documentation

## 2021-07-15 DIAGNOSIS — G8929 Other chronic pain: Secondary | ICD-10-CM | POA: Insufficient documentation

## 2021-07-15 DIAGNOSIS — M6281 Muscle weakness (generalized): Secondary | ICD-10-CM | POA: Insufficient documentation

## 2021-07-15 DIAGNOSIS — M545 Low back pain, unspecified: Secondary | ICD-10-CM | POA: Insufficient documentation

## 2021-07-15 DIAGNOSIS — E11622 Type 2 diabetes mellitus with other skin ulcer: Secondary | ICD-10-CM | POA: Insufficient documentation

## 2021-07-15 DIAGNOSIS — R2689 Other abnormalities of gait and mobility: Secondary | ICD-10-CM | POA: Insufficient documentation

## 2021-07-15 NOTE — Therapy (Signed)
?OUTPATIENT PHYSICAL THERAPY TREATMENT NOTE ? ? ?Patient Name: [name] ?MRN: [id number] ?DOB:1965/06/21, 56 y.o., male ?Today's Date: 07/15/2021 ? ?PCP: Colon Branch, MD ?REFERRING PROVIDER: Bary Leriche, PA-C ? ? PT End of Session - 07/15/21 1406   ? ? Visit Number 12   ? Number of Visits 25   ? Date for PT Re-Evaluation 08/31/21   ? Authorization Type [facility]   ? PT Start Time 1406   ? PT Stop Time 1660   ? PT Time Calculation (min) 41 min   ? Equipment Utilized During Treatment Other (comment);Gait belt   SPC  ? Activity Tolerance Patient tolerated treatment well   ? Behavior During Therapy [facility] for tasks assessed/performed   ? ?  ?  ? ?  ? ? ? ? ? ? ? ? ? ? ? ?Past Medical History:  ?Diagnosis Date  ? DDD (degenerative disc disease), lumbar   ? Diabetes mellitus   ? Hyperlipemia   ? Hypertension   ? Obesity   ? OSA (obstructive sleep apnea)   ? on CPAP  ? ?Past Surgical History:  ?Procedure Laterality Date  ? APPLICATION OF ROBOTIC ASSISTANCE FOR SPINAL PROCEDURE N/A 01/11/2021  ? Procedure: APPLICATION OF ROBOTIC ASSISTANCE FOR SPINAL PROCEDURE;  Surgeon: Judith Part, MD;  Location: [place];  Service: Neurosurgery;  Laterality: N/A;  ? APPLICATION OF WOUND VAC N/A 01/25/2021  ? Procedure: APPLICATION OF WOUND VAC;  Surgeon: Dawley, Theodoro Doing, DO;  Location: [place];  Service: Neurosurgery;  Laterality: N/A;  ? BACK SURGERY    ? COLONOSCOPY  2019  ? LUMBAR WOUND DEBRIDEMENT N/A 01/25/2021  ? Procedure: Irrigation and Debridement of lumbar wound, and wound vacuum assisted closure;  Surgeon: Dawley, Theodoro Doing, DO;  Location: [place];  Service: Neurosurgery;  Laterality: N/A;  ? TRANSFORAMINAL LUMBAR INTERBODY FUSION (TLIF) WITH PEDICLE SCREW FIXATION 4 LEVEL N/A 01/11/2021  ? Procedure: Lumbar one-two, Lumbar two-three, Lumbar three-four, Lumbar four-five, Lumbar five Sacral one Open decompression, Transforaminal lumbar interbody fusion, posterolateral instrumented fusion;  Surgeon: Judith Part, MD;   Location: [place];  Service: Neurosurgery;  Laterality: N/A;  ? ?Patient Active Problem List  ? Diagnosis Date Noted  ? Presence of retained hardware 06/25/2021  ? Hardware complicating wound infection ([place]) 03/13/2021  ? Medication monitoring encounter 03/13/2021  ? Slow transit constipation   ? Disto-occlusion   ? Lumbar discitis   ? Epidural abscess   ? 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 Hypoalbuminemia due to protein-calorie malnutrition ([place])   ? Acute blood loss anemia   ? Other chronic postprocedural pain   ? 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 Klebsiella pneumoniae infection 01/28/2021  ? Wound infection after surgery 01/25/2021  ? Muscle spasms of both lower extremities 01/25/2021  ? Abdominal pain 01/25/2021  ? Spleen enlarged 01/25/2021  ? Lumbar radiculopathy 01/11/2021  ? Spasticity 12/03/2020  ? Neuropathy 09/28/2020  ? Chronic pain syndrome 09/28/2020  ? Intervertebral lumbar disc disorder with myelopathy, lumbar region 09/28/2020  ? Spondylosis, cervical, with myelopathy 09/28/2020  ? Unilateral primary osteoarthritis, right knee 10/28/2019  ? PCP NOTES >>>>>>>> 07/09/2015  ? DJD (degenerative joint disease) 10/27/2014  ? Screening for heart disease 06/28/2012  ? OSA (obstructive sleep apnea) 06/14/2012  ? ED (erectile dysfunction) 04/26/2012  ? Annual physical exam 12/29/2011  ? Achilles tendinitis 09/10/2010  ? Allergic rhinitis 07/12/2010  ? Hyperlipidemia 04/26/2007  ? DM II (diabetes mellitus, type II), controlled ([place]) 04/22/2006  ?  OBESITY NOS 04/22/2006  ? Essential hypertension 04/22/2006  ? ? ?REFERRING DIAG:  ?M51.06 (ICD-10-CM) - Lumbar disc herniation with myelopathy  ? ?THERAPY DIAG:  ?Chronic bilateral low back pain, unspecified whether sciatica present ? ?Muscle weakness (generalized) ? ?Other abnormalities of gait and mobility ? ?PERTINENT HISTORY:  ?s/p L1-S1 decompression and instrumented fusion 01/11/21 ?Inpatient rehab: 01/18/21- 03/08/21 ?Incision and debridement of lumbar  wound, placement of wound VAC 01/25/21 ?Wound care visits occurring on Monday and Thursday ? ?PRECAUTIONS: Wound and Falls ? ?SUBJECTIVE:  ?Pt presents to PT with reports of slight decrease in lower back. Pt has been compliant with HEP and SPC walking, however, he reports a fall the previous.day while walking and carrying peaches. Pt is ready to begin PT at this time.  ? ?Pain Assessment: ?Are you having pain? Yes ?NPRS scale: 7/10 ?Pain location: low back  ?PAIN TYPE: stabbing, dull  ?Pain description: constant  ?Aggravating factors: prolonged sitting  ?Relieving factors: laying supine  ? ? ? ?OBJECTIVE:  ? ?LE MMT: ?  ?MMT Right ?06/05/2021 Left ?06/05/2021  ?Hip flexion 3- 3-  ?Hip extension      ?Hip abduction      ?Hip adduction      ?Hip internal rotation      ?Hip external rotation      ?Knee flexion 4 4  ?Knee extension 4+ 4+  ?Ankle dorsiflexion 5 5  ?Ankle plantarflexion 4 4  ?Ankle inversion      ?Ankle eversion      ? (Blank rows = not tested) ?  ?FUNCTIONAL TESTS:  ?          5 x STS: 17 seconds use of BUE  ?          TUG: 25 seconds RW  ?          2 MWT: 284 ft CGA ?  ?  ?TODAY'S TREATMENT  ?[facility] Adult PT Treatment:                                                DATE: 07/15/2021 ?Therapeutic Exercise: ?NuStep lvl 6 UE/LE x 5 min ?Paloff press 2x10 7# ?Standing hip abd 2x10 ?Standing hip ext x 10 each ?Sit to stand from raised height with no UE support 2x10 ?LAQ 2x10 7.5# ?Seated hamstring curl 2x10 Black TB ?Gait Training: ?Amb 358f with SPC and PT CGA; cues for sequencing and trunk extension ?Amb 1810fwith SPC and PT CGA - pt exhibited scissoring gait when he became fatigued  ? ?[facility]                                                DATE: 07[facility]20[facility] ?Therapeutic Exercise: ?Marching in // x 1 lap ?Standing hip abd/ext x 10 each in // ?Tandem stance 2x30" each ?Sit to stand from raised height with no UE support 3x10 ?LAQ 2x10 6# ?Seated march 2x20 6# ?Gait Training: ?Amb 37033f 2 with SPC  and W/C follow; cues for trunk extension and decreasing gait speed ?Amb up/down 4 stairs with SPC and 1 handhold assist - Step through up, step to down ? ?[facility]  [facility]: 07[facility]21 ?Therapeutic Exercise: ?Seated hamstring curl 2 x 15; black band ?LAQ 2 x 10; 6 lbs ? ?Gait training: ?370 ft with SPC began as CGA to SBA with W/C follow; focusing on arm swing and upright posture; 3 rounds; seated rest break between rounds ? ?Self-Care:  ?- fall prevention checklist for home  ? ? ?[facility] Adult PT Treatment:                                                DATE: 07/04/2021 ?Therapeutic Exercise: ?Marching in // x 1 lap ?Lateral walk in // x 2 lap ?LAQ 2x15 5# ?Sit to stand from raised height with no UE support 4x5 ?Gait Training: ?Amb 150f with SPC and W/C follow; cues for trunk extension and decreasing gait speed ?Additonal 1933fwith seated rest break in between ? ?[facility]                                                DATE: 07/[facility]/2021 ?Therapeutic Exercise: ?NuStep level 5 x 5 minutes  ?Sit to stand 1 x 5 from raised height  ?Gait training: ?With SPC; CGA and W/C follow 3 x 185 ft focusing on step through pattern sequence, gait speed, and maintaining appropriate base of support; seated rest break required between bouts of gait training.  ? ? ?PATIENT EDUCATION:  ?Education details: utilized [facility] household ambulation ?Person educated: patient ?Education method: instruction ?Education comprehension: verbalized understanding  ?  ?  ?HOME EXERCISE PROGRAM: ?Access Code: [email or web address] CenterURL: [email or web address] ?Date: 06/27/2021 ?Prepared by: DaOctavio Manns ?Exercises ?Seated March - 2 x daily - 7 x weekly - 3 sets - 10 reps ?Seated Hip Abduction with Resistance - 2 x daily - 7 x weekly - 3 sets - 10 reps ?Seated Long Arc Quad - 2 x daily - 7 x weekly - 3 sets - 10 reps ?Seated Heel Raise - 2 x daily - 7 x weekly - 3 sets - 10  reps ?Standing Romberg to 1/2 Tandem Stance - 1 x daily - 7 x weekly - 3 sets - 30 sec hold ?Standing Tandem Balance with Counter Support - 1 x daily - 7 x weekly - 2 reps - 30 sec hold ?  ?  ?ASSESSMENT: ?  ?CLINICAL IMPR

## 2021-07-15 NOTE — Progress Notes (Signed)
[name] ([id number]) ?Visit Report for 07/15/2021 ?Chief Complaint Document Details ?Patient Name: Date of Service: ?[name], [name] 07/15/2021 11:00 A M ?Medical Record Number: [id number] ?Patient Account Number: [id number] ?Date of Birth/Sex: Treating RN: ?09-Apr-1966 (56 y.o. M) ?Primary Care Provider: [name] Other Clinician: ?Referring Provider: ?Treating Provider/Extender: Kalman Shan ?[name], [place] ?Weeks in Treatment: 15 ?Information Obtained from: Patient ?Chief Complaint ?Back wound ?Electronic Signature(s) ?Signed: 07/15/2021 12:53:00 PM By: Kalman Shan DO ?Entered By: Kalman Shan on 07/15/2021 12:50:43 ?-------------------------------------------------------------------------------- ?HPI Details ?Patient Name: Date of Service: ?[name], [name] 07/15/2021 11:00 A M ?Medical Record Number: [id number] ?Patient Account Number: [id number] ?Date of Birth/Sex: Treating RN: ?1965-05-23 (56 y.o. M) ?Primary Care Provider: [name] Other Clinician: ?Referring Provider: ?Treating Provider/Extender: Kalman Shan ?[name], [place] ?Weeks in Treatment: 15 ?History of Present Illness ?HPI Description: Admission 03/28/2021 ?[name] is a 56 year old male with a past medical history of controlled type 2 diabetes on oral agents, obesity and OSA that presents to the clinic for a ?back wound. On 01/11/2021 patient had a laminectomy with PLIF of L1-S1 by Dr. Venetia Constable because of lumbar stenosis and radiculopathy. He subsequently ?developed bacteremia. He had CT imaging on 10/13 of the lumbar spine that showed fluid collection in the soft tissue of the posterior L1 and S1 and was taken ?to the OR for washout on 10/14. 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 He received 4 weeks of IV antibiotics by infectious ?disease. After his completion of 4 weeks of IV antibiotics he was continued for an additional 4 weeks of IV cefazolin with a stop date of 12/29. He has been ?evaluated by plastic  surgery and no plans for surgical intervention at this time. Wife is present and reports he has been on the wound VAC for the past 8 ?weeks with improvement in wound healing. He currently denies systemic signs of infection. ?12/22; patient presents for follow-up. He reports no issues since last clinic visit. He denies signs of infection. He has been tolerating the wound VAC well. ?12/30; patient presents for follow-up. He reports no issues and has no complaints today. He has been tolerating the wound VAC well. ?1/9; patient presents for follow-up. He has no issues or complaints today. He states he feels well. He has had no problems with the wound VAC. ?1/16; patient presents for follow-up. He continues to use the wound VAC with no issues. He denies signs of infection. ?1/23; patient presents for follow-up. He has been switched from IV cefazolin to oral cefadroxil by infectious disease. He has no issues or complaints today. He ?denies signs of infection. He continues to tolerate the wound VAC well. ?1/30; patient presents for follow-up. He continues to tolerate the wound VAC well. ?2/6; patient presents for follow-up. He has no issues or complaints today. He continues to tolerate the wound VAC well. He denies signs of infection. ?2/13; patient presents for follow-up. He continues to do well with the wound VAC. He denies any issues. ?2/27; patient presents for follow-up. He continues to use the wound VAC without any issues. He denies signs of infection. ?3/20; patient presents for follow-up. He has no issues or complaints today. He continues to use the wound VAC. ?4/3; patient presents for follow-up. He continues to use the wound VAC without issues. He denies signs of infection. ?Electronic Signature(s) ?Signed: 07/15/2021 12:53:00 PM By: Kalman Shan DO ?Entered By: Kalman Shan on 07/15/2021 12:51:01 ?-------------------------------------------------------------------------------- ?Physical Exam  Details ?Patient Name: Date  of Service: ?[name], [name] 07/15/2021 11:00 A M ?Medical Record Number: [id number] ?Patient Account Number: [id number] ?Date of Birth/Sex: Treating RN: ?Apr 27, 1965 (55 y.o. M) ?Primary Care Provider: [name] Other Clinician: ?Referring Provider: ?Treating Provider/Extender: Kalman Shan ?[name], [place] ?Weeks in Treatment: 15 ?Constitutional ?respirations regular, non-labored and within target range for patient.Marland Kitchen ?Psychiatric ?pleasant and cooperative. ?Notes ?Large open wound to the back. Granulation tissue throughout. There is undermining and tunneling. No probing to bone. No signs of infection. ?Electronic Signature(s) ?Signed: 07/15/2021 12:53:00 PM By: Kalman Shan DO ?Entered By: Kalman Shan on 07/15/2021 12:51:28 ?-------------------------------------------------------------------------------- ?Physician Orders Details ?Patient Name: Date of Service: ?[name], [name] 07/15/2021 11:00 A M ?Medical Record Number: [id number] ?Patient Account Number: [id number] ?Date of Birth/Sex: Treating RN: ?Sep 07, 1965 (56 y.o. [name], [name] ?Primary Care Provider: [name] Other Clinician: ?Referring Provider: ?Treating Provider/Extender: Kalman Shan ?[name], [place] ?Weeks in Treatment: 15 ?Verbal / Phone Orders: No ?Diagnosis Coding ?Follow-up Appointments ?ppointment in 2 weeks. - on Monday 04/17 w/ Dr. Heber Bridgeton and Allayne Butcher Room # 9 ?Return A ?Nurse Visit: - Thursday 04/06, Monday 04/10, Thursday 04/13 ?Bathing/ Shower/ Hygiene ?May shower with protection but do not get wound dressing(s) wet. ?Negative Presssure Wound Therapy ?Wound Vac to wound continuously at 152m/hg pressure ?Black Foam - wound base then bridge to right side ?White Foam - T tunnel at 12:00 ?o ?Additional Orders / Instructions ?Follow Nutritious Diet - Continue to monitor blood sugars daily ?Wound Treatment ?Wound #1 - Back Wound Laterality: Midline ?Prim Dressing: wound VAC ?ary 2 x Per Week/30 Days ?Electronic  Signature(s) ?Signed: 07/15/2021 12:53:00 PM By: HKalman ShanDO ?Previous Signature: 07/15/2021 12:22:18 PM Version By: HKalman ShanDO ?Entered By: HKalman Shanon 07/15/2021 12:51:44 ?-------------------------------------------------------------------------------- ?Problem List Details ?Patient Name: ?Date of Service: ?[place], [name] 07/15/2021 11:00 A M ?Medical Record Number: [id number]?Patient Account Number: [id number]?Date of Birth/Sex: ?Treating RN: ?3Jun 22, 1967((56y.o. 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 07/15/2021 11:00 A M ?Medical Record Number: [id number]?Patient Account Number: [id number]?Date of Birth/Sex: ?Treating RN: ?310/15/1967((56y.o. M) ?Primary Care Provider: PKathlene November?Other Clinician: ?Referring Provider: ?Treating Provider/Extender: HKalman Shan?[place]arose [place]?Weeks in Treatment: 15 ?Subjective ?Chief Complaint ?Information obtained from Patient ?Back wound ?History of Present Illness (HPI) ?Admission 03/28/2021 ?Mr. CIlario Dhaliwalis a 56year old male with a past medical history of controlled type 2 diabetes on oral agents, obesity and OSA that presents to the clinic for a ?back wound. On 01/11/2021 patient had a laminectomy with PLIF of L1-S1 by Dr. OVenetia Constable because of lumbar stenosis and radiculopathy. He subsequently ?developed bacteremia. He had CT imaging on 10/13 of the lumbar spine that showed fluid collection in the soft tissue of the posterior L1 and S1 and was taken ?to the

## 2021-07-15 NOTE — Progress Notes (Signed)
AMERICUS, PERKEY ([id number]) ?Visit Report for 07/15/2021 ?Arrival Information Details ?Patient Name: Date of Service: ?[place], [name] 07/15/2021 11:00 A M ?Medical Record Number: [id number] ?Patient Account Number: [id number] ?Date of Birth/Sex: Treating RN: ?10-22-1965 (56 y.o. [name], [name] ?Primary Care Suhayb Anzalone: [name] Other Clinician: ?Referring Aristotelis Vilardi: ?Treating Markeya Mincy/Extender: Kalman Shan ?[name], [place] ?Weeks in Treatment: 15 ?Visit Information History Since Last Visit ?Added or deleted any medications: No ?Patient Arrived: Gilford Rile ?Any new allergies or adverse reactions: No ?Arrival Time: 11:16 ?Had a fall or experienced change in No ?Accompanied By: friend ?activities of daily living that may affect ?Transfer Assistance: Manual ?risk of falls: ?Patient Identification Verified: Yes ?Signs or symptoms of abuse/neglect since last visito No ?Secondary Verification Process Completed: Yes ?Hospitalized since last visit: No ?Patient Requires Transmission-Based Precautions: No ?Implantable device outside of the clinic excluding No ?Patient Has Alerts: Yes ?cellular tissue based products placed in the center ?Patient Alerts: Patient on Blood Thinner since last visit: ?No BP Right Arm-PICC Has Dressing in Place as Prescribed: Yes ?Pain Present Now: Yes ?Electronic Signature(s) ?Signed: 07/15/2021 3:48:01 PM By: [name] RN ?Entered By: [name] on 07/15/2021 11:17:06 ?-------------------------------------------------------------------------------- ?Encounter Discharge Information Details ?Patient Name: Date of Service: ?[place], [name] 07/15/2021 11:00 A M ?Medical Record Number: [id number] ?Patient Account Number: [id number] ?Date of Birth/Sex: Treating RN: ?1966/03/11 (56 y.o. [name], [name] ?Primary Care Jessup Ogas: [name] Other Clinician: ?Referring Tanyia Grabbe: ?Treating Yulisa Chirico/Extender: Kalman Shan ?[name], [place] ?Weeks in Treatment: 15 ?Encounter Discharge Information  Items ?Discharge Condition: Stable ?Ambulatory Status: Gilford Rile ?Discharge Destination: Home ?Transportation: Private Auto ?Accompanied By: friends ?Schedule Follow-up Appointment: Yes ?Clinical Summary of Care: Patient Declined ?Electronic Signature(s) ?Signed: 07/15/2021 3:48:01 PM By: [name] RN ?Entered By: [name] on 07/15/2021 12:26:22 ?-------------------------------------------------------------------------------- ?Lower Extremity Assessment Details ?Patient Name: ?Date of Service: ?[place], [name] 07/15/2021 11:00 A M ?Medical Record Number: [id number] ?Patient Account Number: [id number] ?Date of Birth/Sex: ?Treating RN: ?[birth year] (56 y.o. [name], [name] ?Primary Care Dimetri Armitage: [name] ?Other Clinician: ?Referring Vasco Chong: ?Treating Elisah Parmer/Extender: Kalman Shan ?[name], [place] ?Weeks in Treatment: 15 ?Electronic Signature(s) ?Signed: 07/15/2021 3:48:01 PM By: [name] RN ?Entered By: [name] on 07/15/2021 11:18:34 ?-------------------------------------------------------------------------------- ?Multi Wound Chart Details ?Patient Name: ?Date of Service: ?[place], [name] 07/15/2021 11:00 A M ?Medical Record Number: [id number] ?Patient Account Number: [id number] ?Date of Birth/Sex: ?Treating RN: ?1965/11/21 (56 y.o. M) ?Primary Care Mikhi Athey: [name] ?Other Clinician: ?Referring Coltin Casher: ?Treating Jarryn Altland/Extender: Kalman Shan ?[name], [place] ?Weeks in Treatment: 15 ?Vital Signs ?Height(in): ?Pulse(bpm): 103 ?Weight(lbs): ?Blood Pressure(mmHg): 125/77 ?Body Mass Index(BMI): ?Temperature(??F): 97.9 ?Respiratory Rate(breaths/min): 17 ?Photos: [1:No Photos Midline Back] [N/A:N/A N/A] ?Wound Location: [1:Surgical Injury] [N/A:N/A] ?Wounding Event: [1:Open Surgical Wound] [N/A:N/A] ?Primary Etiology: [1:Hypertension, Type II Diabetes,] [N/A:N/A] ?Comorbid History: [1:Osteomyelitis 01/11/2021] [N/A:N/A] ?Date Acquired: [1:15] [N/A:N/A] ?Weeks of Treatment: [1:Open]  [N/A:N/A] ?Wound Status: [1:No] [N/A:N/A] ?Wound Recurrence: [1:8.1x1.9x1.5] [N/A:N/A] ?Measurements L x W x D (cm) [1:12.087] [N/A:N/A] ?A (cm?) : ?rea [1:18.131] [N/A:N/A] ?Volume (cm?) : [1:84.00%] [N/A:N/A] ?% Reduction in A rea: [1:89.60%] [N/A:N/A] ?% Reduction in Volume: [1:12] ?Position 1 (o'clock): [1:2.3] ?Maximum Distance 1 (cm): [1:Yes] [N/A:N/A] ?Tunneling: [1:Full Thickness With Exposed Support] [N/A:N/A] ?Classification: [1:Structures Medium] [N/A:N/A] ?Exudate Amount: [1:Serosanguineous] [N/A:N/A] ?Exudate Type: [1:red, brown] [N/A:N/A] ?Exudate Color: [1:Distinct, outline attached] [N/A:N/A] ?Wound Margin: [1:Large (67-100%)] [N/A:N/A] ?Granulation Amount: [1:Red, Pink] [N/A:N/A] ?Granulation Quality: [1:Small (1-33%)] [N/A:N/A] ?Necrotic Amount: [1:Negative Pressure Wound Therapy] [N/A:N/A] ?Procedures Performed: [1:Maintenance (NPWT)] ?Treatment Notes ?Wound #1 (Back) Wound Laterality: Midline ?Cleanser ?Peri-Wound Care ?  Topical ?Primary Dressing ?wound VAC ?Secondary Dressing ?Secured With ?Compression Wrap ?Compression Stockings ?Add-Ons ?Electronic Signature(s) ?Signed: 07/15/2021 12:53:00 PM By: Kalman Shan DO ?Entered By: Kalman Shan on 07/15/2021 12:50:33 ?-------------------------------------------------------------------------------- ?Multi-Disciplinary Care Plan Details ?Patient Name: ?Date of Service: ?[place], [name] 07/15/2021 11:00 A M ?Medical Record Number: [id number] ?Patient Account Number: [id number] ?Date of Birth/Sex: ?Treating RN: ?1965-07-04 (56 y.o. [name], [name] ?Primary Care Demetric Parslow: [name] ?Other Clinician: ?Referring Deforrest Bogle: ?Treating Nasiyah Laverdiere/Extender: Kalman Shan ?[name], [place] ?Weeks in Treatment: 15 ?Multidisciplinary Care Plan reviewed with physician ?Active Inactive ?Wound/Skin Impairment ?Nursing Diagnoses: ?Impaired tissue integrity ?Goals: ?Patient/caregiver will verbalize understanding of skin care regimen ?Date Initiated: 03/28/2021 ?Target  Resolution Date: 08/10/2021 ?Goal Status: Active ?Ulcer/skin breakdown will have a volume reduction of 30% by week 4 ?Date Initiated: 03/28/2021 ?Date Inactivated: 05/27/2021 ?Target Resolution Date: 05/31/2021 ?Goal Status: Met ?Interventions: ?Assess patient/caregiver ability to obtain necessary supplies ?Assess patient/caregiver ability to perform ulcer/skin care regimen upon admission and as needed ?Assess ulceration(s) every visit ?Provide education on ulcer and skin care ?Treatment Activities: ?Topical wound management initiated : 03/28/2021 ?Notes: ?Electronic Signature(s) ?Signed: 07/15/2021 3:48:01 PM By: [name] RN ?Entered By: [name] on 07/15/2021 11:36:41 ?-------------------------------------------------------------------------------- ?Negative Pressure Wound Therapy Maintenance (NPWT) Details ?Patient Name: ?Date of Service: ?[place], [name] 07/15/2021 11:00 A M ?Medical Record Number: [id number] ?Patient Account Number: [id number] ?Date of Birth/Sex: ?Treating RN: ?Jan 20, 1966 (56 y.o. [name], [name] ?Primary Care Aylyn Wenzler: [name] ?Other Clinician: ?Referring Latesia Norrington: ?Treating Bevin Mayall/Extender: Kalman Shan ?[name], [place] ?Weeks in Treatment: 15 ?NPWT Maintenance Performed for: Wound #1 Midline Back ?Performed By: [name], RN ?Type: VAC System ?Coverage Size (sq cm): 15.39 ?Pressure Type: Constant ?Pressure Setting: 125 mmHG ?Drain Type: None ?Primary Contact: Non-Adherent ?Sponge/Dressing Type: ?Combination : 1 white 1 black ?Date Initiated: 03/28/2021 ?Dressing Removed: Yes ?Quantity of Sponges/Gauze Removed: 1 white 1 black ?Canister Changed: No ?Canister Exudate Volume: 50 ?Dressing Reapplied: Yes ?Quantity of Sponges/Gauze Inserted: 1 white 1 black ?Respones T Treatment: ?o tolerates well ?Days On NPWT : 110 ?Post Procedure Diagnosis ?Same as Pre-procedure ?Electronic Signature(s) ?Signed: 07/15/2021 3:48:01 PM By: [name] RN ?Entered By: [name]  on 07/15/2021 11:32:36 ?-------------------------------------------------------------------------------- ?Pain Assessment Details ?Patient Name: ?Date of Service: ?[place], [name] 07/15/2021 11:00 A M ?Medical Record Number: [id number]

## 2021-07-17 ENCOUNTER — Encounter: Payer: Self-pay | Admitting: Physical Medicine and Rehabilitation

## 2021-07-18 ENCOUNTER — Encounter (HOSPITAL_BASED_OUTPATIENT_CLINIC_OR_DEPARTMENT_OTHER): Payer: No Typology Code available for payment source | Admitting: Internal Medicine

## 2021-07-18 ENCOUNTER — Ambulatory Visit: Payer: No Typology Code available for payment source

## 2021-07-18 DIAGNOSIS — M545 Low back pain, unspecified: Secondary | ICD-10-CM | POA: Diagnosis not present

## 2021-07-18 DIAGNOSIS — M6281 Muscle weakness (generalized): Secondary | ICD-10-CM

## 2021-07-18 DIAGNOSIS — L98424 Non-pressure chronic ulcer of back with necrosis of bone: Secondary | ICD-10-CM | POA: Diagnosis not present

## 2021-07-18 NOTE — Therapy (Signed)
?OUTPATIENT PHYSICAL THERAPY TREATMENT NOTE ? ? ?Patient Name: [name] ?MRN: [id number] ?DOB:November 14, 1965, 56 y.o., male ?Today's Date: 07/18/2021 ? ?PCP: Colon Branch, MD ?REFERRING PROVIDER: Bary Leriche, PA-C ? ? PT End of Session - 07/18/21 1405   ? ? Visit Number 13   ? Number of Visits 25   ? Date for PT Re-Evaluation 08/31/21   ? Authorization Type [facility]   ? PT Start Time 1405   ? PT Stop Time 6962   ? PT Time Calculation (min) 38 min   ? Equipment Utilized During Treatment Other (comment);Gait belt   SPC  ? Activity Tolerance Patient tolerated treatment well   ? Behavior During Therapy [facility] for tasks assessed/performed   ? ?  ?  ? ?  ? ? ? ? ? ? ? ? ? ? ? ? ?Past Medical History:  ?Diagnosis Date  ? DDD (degenerative disc disease), lumbar   ? Diabetes mellitus   ? Hyperlipemia   ? Hypertension   ? Obesity   ? OSA (obstructive sleep apnea)   ? on CPAP  ? ?Past Surgical History:  ?Procedure Laterality Date  ? APPLICATION OF ROBOTIC ASSISTANCE FOR SPINAL PROCEDURE N/A 01/11/2021  ? Procedure: APPLICATION OF ROBOTIC ASSISTANCE FOR SPINAL PROCEDURE;  Surgeon: Judith Part, MD;  Location: [place];  Service: Neurosurgery;  Laterality: N/A;  ? APPLICATION OF WOUND VAC N/A 01/25/2021  ? Procedure: APPLICATION OF WOUND VAC;  Surgeon: Dawley, Theodoro Doing, DO;  Location: [place];  Service: Neurosurgery;  Laterality: N/A;  ? BACK SURGERY    ? COLONOSCOPY  2019  ? LUMBAR WOUND DEBRIDEMENT N/A 01/25/2021  ? Procedure: Irrigation and Debridement of lumbar wound, and wound vacuum assisted closure;  Surgeon: Dawley, Theodoro Doing, DO;  Location: [place];  Service: Neurosurgery;  Laterality: N/A;  ? TRANSFORAMINAL LUMBAR INTERBODY FUSION (TLIF) WITH PEDICLE SCREW FIXATION 4 LEVEL N/A 01/11/2021  ? Procedure: Lumbar one-two, Lumbar two-three, Lumbar three-four, Lumbar four-five, Lumbar five Sacral one Open decompression, Transforaminal lumbar interbody fusion, posterolateral instrumented fusion;  Surgeon: Judith Part, MD;   Location: [place];  Service: Neurosurgery;  Laterality: N/A;  ? ?Patient Active Problem List  ? Diagnosis Date Noted  ? Presence of retained hardware 06/25/2021  ? Hardware complicating wound infection ([place]) 03/13/2021  ? Medication monitoring encounter 03/13/2021  ? Slow transit constipation   ? Disto-occlusion   ? Lumbar discitis   ? Epidural abscess   ? 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 Hypoalbuminemia due to protein-calorie malnutrition ([place])   ? Acute blood loss anemia   ? Other chronic postprocedural pain   ? 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 Klebsiella pneumoniae infection 01/28/2021  ? Wound infection after surgery 01/25/2021  ? Muscle spasms of both lower extremities 01/25/2021  ? Abdominal pain 01/25/2021  ? Spleen enlarged 01/25/2021  ? Lumbar radiculopathy 01/11/2021  ? Spasticity 12/03/2020  ? Neuropathy 09/28/2020  ? Chronic pain syndrome 09/28/2020  ? Intervertebral lumbar disc disorder with myelopathy, lumbar region 09/28/2020  ? Spondylosis, cervical, with myelopathy 09/28/2020  ? Unilateral primary osteoarthritis, right knee 10/28/2019  ? PCP NOTES >>>>>>>> 07/09/2015  ? DJD (degenerative joint disease) 10/27/2014  ? Screening for heart disease 06/28/2012  ? OSA (obstructive sleep apnea) 06/14/2012  ? ED (erectile dysfunction) 04/26/2012  ? Annual physical exam 12/29/2011  ? Achilles tendinitis 09/10/2010  ? Allergic rhinitis 07/12/2010  ? Hyperlipidemia 04/26/2007  ? DM II (diabetes mellitus, type II), controlled ([place]) 04/22/2006  ?  OBESITY NOS 04/22/2006  ? Essential hypertension 04/22/2006  ? ? ?REFERRING DIAG:  ?M51.06 (ICD-10-CM) - Lumbar disc herniation with myelopathy  ? ?THERAPY DIAG:  ?Chronic bilateral low back pain, unspecified whether sciatica present ? ?Muscle weakness (generalized) ? ?PERTINENT HISTORY:  ?s/p L1-S1 decompression and instrumented fusion 01/11/21 ?Inpatient rehab: 01/18/21- 03/08/21 ?Incision and debridement of lumbar wound, placement of wound VAC 01/25/21 ?Wound  care visits occurring on Monday and Thursday ? ?PRECAUTIONS: Wound and Falls ? ?SUBJECTIVE:  ?Pt presents to PT with reports of increased LBP. Pt has been compliant with HEP with no adverse effect. Pt is ready to begin PT at this time. ? ?Pain Assessment: ?Are you having pain? Yes ?NPRS scale: 7/10 ?Pain location: low back  ?PAIN TYPE: stabbing, dull  ?Pain description: constant  ?Aggravating factors: prolonged sitting  ?Relieving factors: laying supine  ? ? ? ?OBJECTIVE:  ? ?LE MMT: ?  ?MMT Right ?06/05/2021 Left ?06/05/2021  ?Hip flexion 3- 3-  ?Hip extension      ?Hip abduction      ?Hip adduction      ?Hip internal rotation      ?Hip external rotation      ?Knee flexion 4 4  ?Knee extension 4+ 4+  ?Ankle dorsiflexion 5 5  ?Ankle plantarflexion 4 4  ?Ankle inversion      ?Ankle eversion      ? (Blank rows = not tested) ?  ?FUNCTIONAL TESTS:  ?            5 x STS: 17 seconds use of BUE  ?           TUG: 25 seconds RW  ?           2 MWT: 284 ft CGA ?  ?  ?TODAY'S TREATMENT  ?[facility] Adult PT Treatment:                                                DATE: 07/18/2021 ?Therapeutic Exercise: ?NuStep lvl 6 UE/LE x 5 min ?STS from elevated table 3x10 ?LAQ 7.5# 2x10 each ?Standing hip abd/ext 2x10 each ?Standing hip ext x 10 each ?Neuromuscular Re-Ed: ?Tandem stance 3x30" each ?Gait Training: ?Amb 169f with SPC and PT CGA; cues for sequencing and trunk extension ?[facility]                                                [facility] ?Therapeutic Exercise: ?NuStep lvl 6 UE/LE x 5 min ?Paloff press 2x10 7# ?Standing hip abd 2x10 ?Standing hip ext x 10 each ?Sit to stand from raised height with no UE support 2x10 ?LAQ 2x10 7.5# ?Seated hamstring curl 2x10 Black TB ?Gait Training: ?Amb 3743fwith SPC and PT CGA; cues for sequencing and trunk extension ?Amb 18561fith SPC and PT CGA - pt exhibited scissoring gait when he became fatigued  ? ?[facility]                                                [facility]  [facility] ?Therapeutic Exercise: ?Marching in //  x 1 lap ?Standing hip abd/ext x 10 each in // ?Tandem stance 2x30" each ?Sit to stand from raised height with no UE support 3x10 ?LAQ 2x10 6# ?Seated march 2x20 6# ?Gait Training: ?Amb 334f x 2 with SPC and W/C follow; cues for trunk extension and decreasing gait speed ?Amb up/down 4 stairs with SPC and 1 handhold assist - Step through up, step to down ? ? ?PATIENT EDUCATION:  ?[facility] details[facility] ?Person educated: patient ?Education method: instruction ?Education comprehension: verbalized understanding  ?  ?  ?HOME EXERCISE PROGRAM: ?Access Code: [email or web address]?URL: [email or web address] ?Date: 06/27/2021 ?Prepared by: DOctavio Manns? ?Exercises ?Seated March - 2 x daily - 7 x weekly - 3 sets - 10 reps ?Seated Hip Abduction with Resistance - 2 x daily - 7 x weekly - 3 sets - 10 reps ?Seated Long Arc Quad - 2 x daily - 7 x weekly - 3 sets - 10 reps ?Seated Heel Raise - 2 x daily - 7 x weekly - 3 sets - 10 reps ?Standing Romberg to 1/2 Tandem Stance - 1 x daily - 7 x weekly - 3 sets - 30 sec hold ?Standing Tandem Balance with Counter Support - 1 x daily - 7 x weekly - 2 reps - 30 sec hold ?  ?  ?ASSESSMENT: ?  ?CLINICAL IMPRESSION: ?Pt was again able to complete all prescribed exercises with no adverse effect. Therapy today again focused on LE strengthening and improving functional activity tolerance in order to decrease pain and improve mobility. PT will continue to progress pt as able, with continued stair and mobility training as tolerated.  ?  ?OBJECTIVE IMPAIRMENTS Abnormal gait, decreased activity tolerance, decreased balance, decreased endurance, decreased mobility, difficulty walking, decreased ROM, decreased strength, impaired flexibility, improper body mechanics, postural dysfunction, obesity, and pain.  ?  ?ACTIVITY LIMITATIONS cleaning, community activity, driving, meal prep, occupation, laundry, shopping, and  yard work.  ?  ?PERSONAL FACTORS Age, Fitness, and Time since onset of injury/illness/exacerbation are also affecting patient's functional outcome.  ?  ?  ?GOALS: ?Goals reviewed with patient? No ?  ?SHORT T

## 2021-07-18 NOTE — Progress Notes (Signed)
AGRON, SWINEY ([id number]) ?Visit Report for 07/18/2021 ?SuperBill Details ?Patient Name: Date of Service: ?[name], [name]. 07/18/2021 ?Medical Record Number: [id number] ?Patient Account Number: [id number] ?Date of Birth/Sex: Treating RN: ?03/13/1966 (56 y.o. 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

## 2021-07-18 NOTE — Progress Notes (Signed)
GARRUS, GAUTHREAUX ([id number]) ?Visit Report for 07/18/2021 ?Arrival Information Details ?Patient Name: Date of Service: ?[name], Christian [name]. 07/18/2021 9:30 A M ?Medical Record Number: [id number] ?Patient Account Number: [id number] ?Date of Birth/Sex: Treating RN: ?Feb 13, 1966 (56 y.o. M) [name], [name] ?Primary Care Lavinia Mcneely: [name] Other Clinician: ?Referring Jerrilynn Mikowski: ?Treating Kelise Kuch/Extender: Kalman Shan ?[name], [place] ?Weeks in Treatment: 16 ?Visit Information History Since Last Visit ?Added or deleted any medications: No ?Patient Arrived: [name] ?Any new allergies or adverse reactions: No ?Arrival Time: 09:45 ?Had a fall or experienced change in No ?Accompanied By: family member ?activities of daily living that may affect ?Transfer Assistance: None ?risk of falls: ?Patient Identification Verified: Yes ?Signs or symptoms of abuse/neglect since last visito No ?Secondary Verification Process Completed: Yes ?Hospitalized since last visit: No ?Patient Requires Transmission-Based Precautions: No ?Implantable device outside of the clinic excluding No ?Patient Has Alerts: Yes ?cellular tissue based products placed in the center ?Patient Alerts: Patient on Blood Thinner since last visit: ?No BP Right Arm-PICC Has Dressing in Place as Prescribed: Yes ?Pain Present Now: No ?Electronic Signature(s) ?Signed: 07/18/2021 5:34:01 PM By: Deon Pilling RN, BSN ?Entered By: Deon Pilling on 07/18/2021 11:38:58 ?-------------------------------------------------------------------------------- ?Encounter Discharge Information Details ?Patient Name: Date of Service: ?[name], Christian [name]. 07/18/2021 9:30 A M ?Medical Record Number: [id number] ?Patient Account Number: [id number] ?Date of Birth/Sex: Treating RN: ?[birth year] (56 y.o. M) [name], [name] ?Primary Care Magie Ciampa: [name] Other Clinician: ?Referring Karinna Beadles: ?Treating Eleina Jergens/Extender: Kalman Shan ?[name], [place] ?Weeks in Treatment: 16 ?Encounter Discharge Information Items ?Discharge  Condition: Stable ?Ambulatory Status: [name] ?Discharge Destination: Home ?Transportation: Private Auto ?Accompanied By: family member ?Schedule Follow-up Appointment: Yes ?Clinical Summary of Care: ?Electronic Signature(s) ?Signed: 07/18/2021 5:34:01 PM By: Deon Pilling RN, BSN ?Entered By: Deon Pilling on 07/18/2021 12:38:02 ?-------------------------------------------------------------------------------- ?Negative Pressure Wound Therapy Maintenance (NPWT) Details ?Patient Name: ?Date of Service: ?[name], Christian [name]. 07/18/2021 9:30 A M ?Medical Record Number: [id number] ?Patient Account Number: [id number] ?Date of Birth/Sex: ?Treating RN: ?17-Jul-1965 (56 y.o. M) [name], [name] ?Primary Care Wanza Szumski: [name] ?Other Clinician: ?Referring Steadman Prosperi: ?Treating Wallice Granville/Extender: Kalman Shan ?[name], [place] ?Weeks in Treatment: 16 ?NPWT Maintenance Performed for: Wound #1 Midline Back ?Performed By: Deon Pilling, RN ?Type: VAC System ?Coverage Size (sq cm): 14.25 ?Pressure Type: Constant ?Pressure Setting: 125 mmHG ?Drain Type: None ?Primary Contact: Non-Adherent ?Sponge/Dressing Type: ?Combination : x1 white foam and x1 black foam ?Date Initiated: 03/28/2021 ?Dressing Removed: Yes ?Quantity of Sponges/Gauze Removed: x1 white foam and x1 black foam ?Canister Changed: Yes ?Canister Exudate Volume: 100 ?Dressing Reapplied: Yes ?Quantity of Sponges/Gauze Inserted: x1 white foam; x1 black foam bridged to right side ?Respones T Treatment: ?o tolerated well ?Days On NPWT : 113 ?Electronic Signature(s) ?Signed: 07/18/2021 5:34:01 PM By: Deon Pilling RN, BSN ?Entered By: Deon Pilling on 07/18/2021 12:36:54 ?-------------------------------------------------------------------------------- ?Patient/Caregiver Education Details ?Patient Name: ?Date of Service: ?[name], Christian [name]. 4/6/2023andnbsp9:30 A M ?Medical Record Number: [id number] ?Patient Account Number: [id number] ?Date of Birth/Gender: ?Treating RN: ?1965/11/25 (56 y.o. M) [name],  [name] ?Primary Care Physician: [name] ?Other Clinician: ?Referring Physician: ?Treating Physician/Extender: Kalman Shan ?[name], [place] ?Weeks in Treatment: 16 ?Education Assessment ?Education Provided To: ?Patient ?Education Topics Provided ?Wound/Skin Impairment: ?Handouts: Skin Care Do's and Dont's ?Methods: Explain/Verbal ?Responses: Reinforcements needed ?Electronic Signature(s) ?Signed: 07/18/2021 5:34:01 PM By: Deon Pilling RN, BSN ?Entered By: Deon Pilling on 07/18/2021 12:37:20 ?-------------------------------------------------------------------------------- ?Wound Assessment Details ?Patient Name: ?Date of Service: ?[name], Christian [name]. 07/18/2021 9:30 A M ?Medical Record Number: [id number] ?Patient Account Number: [id number] ?Date  of Birth/Sex: ?Treating RN: ?[birth year] (56 y.o. M) [name], [name] ?Primary Care Malcomb Gangemi: [name] ?Other Clinician: ?Referring Caelan Branden: ?Treating Ladonya Jerkins/Extender: Kalman Shan ?[name], [place] ?Weeks in Treatment: 16 ?Wound Status ?Wound Number: 1 ?Primary Etiology: Open Surgical Wound ?Wound Location: Midline Back ?Wound Status: Open ?Wounding Event: Surgical Injury ?Comorbid History: Hypertension, Type II Diabetes, Osteomyelitis ?Date Acquired: 01/11/2021 ?Weeks Of Treatment: 16 ?Clustered Wound: No ?Wound Measurements ?Length: (cm) 9.5 ?Width: (cm) 1.5 ?Depth: (cm) 1.5 ?Area: (cm?) 11.192 ?Volume: (cm?) 16.788 ?% Reduction in Area: 85.2% ?% Reduction in Volume: 90.3% ?Epithelialization: Medium (34-66%) ?Tunneling: Yes ?Position (o'clock): 12 ?Maximum Distance: (cm) 2.1 ?Wound Description ?Classification: Full Thickness With Exposed Support Structures ?Wound Margin: Distinct, outline attached ?Exudate Amount: Medium ?Exudate Type: Serosanguineous ?Exudate Color: red, brown ?Foul Odor After Cleansing: No ?Slough/Fibrino No ?Wound Bed ?Granulation Amount: Large (67-100%) Exposed Structure ?Granulation Quality: Red, Pink ?Fascia Exposed: No ?Necrotic Amount: None Present (0%) ?Fat  Layer (Subcutaneous Tissue) Exposed: Yes ?Tendon Exposed: No ?Muscle Exposed: Yes ?Necrosis of Muscle: No ?Joint Exposed: No ?Bone Exposed: No ?Treatment Notes ?Wound #1 (Back) Wound Laterality: Midline ?Cleanser ?Peri-Wound Care ?Topical ?Primary Dressing ?wound VAC ?Secondary Dressing ?Secured With ?Compression Wrap ?Compression Stockings ?Add-Ons ?Electronic Signature(s) ?Signed: 07/18/2021 5:34:01 PM By: Deon Pilling RN, BSN ?Entered By: Deon Pilling on 07/18/2021 12:36:15 ?-------------------------------------------------------------------------------- ?Vitals Details ?Patient Name: ?Date of Service: ?[name], Christian [name]. 07/18/2021 9:30 A M ?Medical Record Number: [id number] ?Patient Account Number: [id number] ?Date of Birth/Sex: ?Treating RN: ?1965/04/27 (56 y.o. M) [name], [name] ?Primary Care Treacy Holcomb: [name] ?Other Clinician: ?Referring Wretha Laris: ?Treating Autry Prust/Extender: Kalman Shan ?[name], [place] ?Weeks in Treatment: 16 ?Vital Signs ?Time Taken: 09:45 ?Temperature (??F): 97.9 ?Pulse (bpm): 98 ?Respiratory Rate (breaths/min): 20 ?Blood Pressure (mmHg): 144/79 ?Reference Range: 80 - 120 mg / dl ?Electronic Signature(s) ?Signed: 07/18/2021 5:34:01 PM By: Deon Pilling RN, BSN ?Entered By: Deon Pilling on 07/18/2021 11:39:19 ?

## 2021-07-22 ENCOUNTER — Encounter (HOSPITAL_BASED_OUTPATIENT_CLINIC_OR_DEPARTMENT_OTHER): Payer: No Typology Code available for payment source | Admitting: Internal Medicine

## 2021-07-22 ENCOUNTER — Ambulatory Visit: Payer: No Typology Code available for payment source

## 2021-07-22 DIAGNOSIS — M6281 Muscle weakness (generalized): Secondary | ICD-10-CM

## 2021-07-22 DIAGNOSIS — L98424 Non-pressure chronic ulcer of back with necrosis of bone: Secondary | ICD-10-CM | POA: Diagnosis not present

## 2021-07-22 DIAGNOSIS — M545 Low back pain, unspecified: Secondary | ICD-10-CM | POA: Diagnosis not present

## 2021-07-22 NOTE — Therapy (Signed)
?OUTPATIENT PHYSICAL THERAPY TREATMENT NOTE ? ? ?Patient Name: [name] ?MRN: [id number] ?DOB:1965-04-29, 56 y.o., male ?Today's Date: 07/22/2021 ? ?PCP: Colon Branch, MD ?REFERRING PROVIDER: Bary Leriche, PA-C ? ? PT End of Session - 07/22/21 1400   ? ? Visit Number 14   ? Number of Visits 25   ? Date for PT Re-Evaluation 08/31/21   ? Authorization Type [facility]   ? PT Start Time 1400   ? PT Stop Time 1440   ? PT Time Calculation (min) 40 min   ? Equipment Utilized During Treatment Other (comment);Gait belt   SPC  ? Activity Tolerance Patient tolerated treatment well   ? Behavior During Therapy [facility] for tasks assessed/performed   ? ?  ?  ? ?  ? ? ? ? ? ? ? ? ? ? ? ? ? ?Past Medical History:  ?Diagnosis Date  ? DDD (degenerative disc disease), lumbar   ? Diabetes mellitus   ? Hyperlipemia   ? Hypertension   ? Obesity   ? OSA (obstructive sleep apnea)   ? on CPAP  ? ?Past Surgical History:  ?Procedure Laterality Date  ? APPLICATION OF ROBOTIC ASSISTANCE FOR SPINAL PROCEDURE N/A 01/11/2021  ? Procedure: APPLICATION OF ROBOTIC ASSISTANCE FOR SPINAL PROCEDURE;  Surgeon: Judith Part, MD;  Location: [place];  Service: Neurosurgery;  Laterality: N/A;  ? APPLICATION OF WOUND VAC N/A 01/25/2021  ? Procedure: APPLICATION OF WOUND VAC;  Surgeon: Dawley, Theodoro Doing, DO;  Location: [place];  Service: Neurosurgery;  Laterality: N/A;  ? BACK SURGERY    ? COLONOSCOPY  2019  ? LUMBAR WOUND DEBRIDEMENT N/A 01/25/2021  ? Procedure: Irrigation and Debridement of lumbar wound, and wound vacuum assisted closure;  Surgeon: Dawley, Theodoro Doing, DO;  Location: [place];  Service: Neurosurgery;  Laterality: N/A;  ? TRANSFORAMINAL LUMBAR INTERBODY FUSION (TLIF) WITH PEDICLE SCREW FIXATION 4 LEVEL N/A 01/11/2021  ? Procedure: Lumbar one-two, Lumbar two-three, Lumbar three-four, Lumbar four-five, Lumbar five Sacral one Open decompression, Transforaminal lumbar interbody fusion, posterolateral instrumented fusion;  Surgeon: Judith Part, MD;   Location: [place];  Service: Neurosurgery;  Laterality: N/A;  ? ?Patient Active Problem List  ? Diagnosis Date Noted  ? Presence of retained hardware 06/25/2021  ? Hardware complicating wound infection ([place]) 03/13/2021  ? Medication monitoring encounter 03/13/2021  ? Slow transit constipation   ? Disto-occlusion   ? Lumbar discitis   ? Epidural abscess   ? 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 Hypoalbuminemia due to protein-calorie malnutrition ([place])   ? Acute blood loss anemia   ? Other chronic postprocedural pain   ? 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 Klebsiella pneumoniae infection 01/28/2021  ? Wound infection after surgery 01/25/2021  ? Muscle spasms of both lower extremities 01/25/2021  ? Abdominal pain 01/25/2021  ? Spleen enlarged 01/25/2021  ? Lumbar radiculopathy 01/11/2021  ? Spasticity 12/03/2020  ? Neuropathy 09/28/2020  ? Chronic pain syndrome 09/28/2020  ? Intervertebral lumbar disc disorder with myelopathy, lumbar region 09/28/2020  ? Spondylosis, cervical, with myelopathy 09/28/2020  ? Unilateral primary osteoarthritis, right knee 10/28/2019  ? PCP NOTES >>>>>>>> 07/09/2015  ? DJD (degenerative joint disease) 10/27/2014  ? Screening for heart disease 06/28/2012  ? OSA (obstructive sleep apnea) 06/14/2012  ? ED (erectile dysfunction) 04/26/2012  ? Annual physical exam 12/29/2011  ? Achilles tendinitis 09/10/2010  ? Allergic rhinitis 07/12/2010  ? Hyperlipidemia 04/26/2007  ? DM II (diabetes mellitus, type II), controlled ([place])  04/22/2006  ? OBESITY NOS 04/22/2006  ? Essential hypertension 04/22/2006  ? ? ?REFERRING DIAG:  ?M51.06 (ICD-10-CM) - Lumbar disc herniation with myelopathy  ? ?THERAPY DIAG:  ?Chronic bilateral low back pain, unspecified whether sciatica present ? ?Muscle weakness (generalized) ? ?PERTINENT HISTORY:  ?s/p L1-S1 decompression and instrumented fusion 01/11/21 ?Inpatient rehab: 01/18/21- 03/08/21 ?Incision and debridement of lumbar wound, placement of wound VAC 01/25/21 ?Wound  care visits occurring on Monday and Thursday ? ?PRECAUTIONS: Wound and Falls ? ?SUBJECTIVE:  ?Pt presents to PT with continued reports of lower back pain. Has been compliant with HEP with no adverse effect. Pt is ready to begin PT at this time.  ? ?Pain Assessment: ?Are you having pain? Yes ?NPRS scale: 8/10 ?Pain location: low back  ?PAIN TYPE: stabbing, dull  ?Pain description: constant  ?Aggravating factors: prolonged sitting  ?Relieving factors: laying supine  ? ? ?OBJECTIVE:  ? ?LE MMT: ?  ?MMT Right ?06/05/2021 Left ?06/05/2021  ?Hip flexion 3- 3-  ?Hip extension      ?Hip abduction      ?Hip adduction      ?Hip internal rotation      ?Hip external rotation      ?Knee flexion 4 4  ?Knee extension 4+ 4+  ?Ankle dorsiflexion 5 5  ?Ankle plantarflexion 4 4  ?Ankle inversion      ?Ankle eversion      ? (Blank rows = not tested) ?  ?FUNCTIONAL TESTS:  ?            5 x STS: 17 seconds use of BUE  ?           TUG: 25 seconds RW  ?           2 MWT: 284 ft CGA ?  ?  ?TODAY'S TREATMENT  ?[facility] Adult PT Treatment:                                                DATE: 07/22/2021 ?Therapeutic Exercise: ?NuStep lvl 7 UE/LE x 5 min ?Row 2x10 17#  ?Pallof press 2x10 7# ?Lateral walk in // x 3 laps ?STS from elevated table 3x10 ?LAQ 7.5# 2x15 each ?Standing hip abd/ext 2x10 each 2.5# ?Standing alt march no UE support 2x20 ?Neuromuscular Re-Ed: ?Tandem stance 3x30" each ?Amb in // x 3 laps no UE support ?Gait Training: ?Amb up/down 4 stairs x 10 with SPC and 1 UE assist on rail - step to pattern on descent  ? ?[facility] Adult PT Treatment:                                                DATE: 07/18/2021 ?Therapeutic Exercise: ?NuStep lvl 6 UE/LE x 5 min ?STS from elevated table 3x10 ?LAQ 7.5# 2x10 each ?Standing hip abd/ext 2x10 each ?Standing hip ext x 10 each ?Neuromuscular Re-Ed: ?Tandem stance 3x30" each ?Gait Training: ?Amb 172f with SPC and PT CGA; cues for sequencing and trunk extension ?[facility]  [facility] ?Therapeutic Exercise: ?NuStep lvl 6 UE/LE x 5 min ?Paloff press 2x10 7# ?Standing hip abd 2x10 ?Standing hip ext x 10 each ?Sit to stand from raised height with no UE support 2x10 ?LAQ 2x10 7.5# ?Seated hamstring curl 2x10 Black TB ?Gait Training: ?Amb 349f with SPC and PT CGA; cues for sequencing and trunk extension ?Amb 1811fwith SPC and PT CGA - pt exhibited scissoring gait when he became fatigued  ? ?[facility]                                                [facility]: [facility] ?Therapeutic Exercise: ?Marching in // x 1 lap ?Standing hip abd/ext x 10 each in // ?Tandem stance 2x30" each ?Sit to stand from raised height with no UE support 3x10 ?LAQ 2x10 6# ?Seated march 2x20 6# ?Gait Training: ?Amb 37078f 2 with SPC and W/C follow; cues for trunk extension and decreasing gait speed ?Amb up/down 4 stairs with SPC and 1 handhold assist - Step through up, step to down ? ? ?PATIENT EDUCATION:  ?Education details: [facility] ambulation ?Person educated: patient ?Education method: instruction ?Education comprehension: verbalized understanding  ?  ?  ?HOME EXERCISE PROGRAM: ?Access Code: [email or web address]: [email or web address] ?Date: 06/27/2021 ?Prepared by: DavOctavio Manns?Exercises ?Seated March - 2 x daily - 7 x weekly - 3 sets - 10 reps ?Seated Hip Abduction with Resistance - 2 x daily - 7 x weekly - 3 sets - 10 reps ?Seated Long Arc Quad - 2 x daily - 7 x weekly - 3 sets - 10 reps ?Seated Heel Raise - 2 x daily - 7 x weekly - 3 sets - 10 reps ?Standing Romberg to 1/2 Tandem Stance - 1 x daily - 7 x weekly - 3 sets - 30 sec hold ?Standing Tandem Balance with Counter Support - 1 x daily - 7 x weekly - 2 reps - 30 sec hold ?  ?  ?ASSESSMENT: ?  ?CLINICAL IMPRESSION: ?Pt was able to complete all prescribed exercises with no adverse effect. Therapy today continued to progress standing activity and core/proximal hip strength in order to  decrease pain and improve mobility. He continues to benefit from skilled PT services and will continue to be seen and progressed as tolerated per POC.  ?  ?OBJECTIVE IMPAIRMENTS Abnormal gait, decreased acti

## 2021-07-22 NOTE — Progress Notes (Signed)
JERAD, DUNLAP ([id number]) ?Visit Report for 07/22/2021 ?Arrival Information Details ?Patient Name: Date of Service: ?[place], [name] 07/22/2021 9:30 A M ?Medical Record Number: [id number] ?Patient Account Number: [id number] ?Date of Birth/Sex: Treating RN: ?04/01/1966 (56 y.o. M) [name], [name] ?Primary Care Essa Wenk: [name] Other Clinician: ?Referring Kam Kushnir: ?Treating Taegen Lennox/Extender: Linton Ham ?[name], [place] ?Weeks in Treatment: 16 ?Visit Information History Since Last Visit ?Added or deleted any medications: No ?Patient Arrived: Gilford Rile ?Any new allergies or adverse reactions: No ?Arrival Time: 09:40 ?Had a fall or experienced change in No ?Accompanied By: self ?activities of daily living that may affect ?Transfer Assistance: None ?risk of falls: ?Patient Identification Verified: Yes ?Signs or symptoms of abuse/neglect since last visito No ?Secondary Verification Process Completed: Yes ?Hospitalized since last visit: No ?Patient Requires Transmission-Based Precautions: No ?Implantable device outside of the clinic excluding No ?Patient Has Alerts: Yes ?cellular tissue based products placed in the center ?Patient Alerts: Patient on Blood Thinner since last visit: ?No BP Right Arm-PICC Has Dressing in Place as Prescribed: Yes ?Pain Present Now: No ?Electronic Signature(s) ?Signed: 07/22/2021 5:32:14 PM By: Deon Pilling RN, BSN ?Entered By: Deon Pilling on 07/22/2021 09:58:55 ?-------------------------------------------------------------------------------- ?Encounter Discharge Information Details ?Patient Name: Date of Service: ?[place], [name] 07/22/2021 9:30 A M ?Medical Record Number: [id number] ?Patient Account Number: [id number] ?Date of Birth/Sex: Treating RN: ?05-10-1965 (56 y.o. M) [name], [name] ?Primary Care Ciella Obi: [name] Other Clinician: ?Referring Zahira Brummond: ?Treating Carlissa Pesola/Extender: Linton Ham ?[name], [place] ?Weeks in Treatment: 16 ?Encounter Discharge Information Items ?Discharge Condition:  Stable ?Ambulatory Status: Gilford Rile ?Discharge Destination: Home ?Transportation: Private Auto ?Accompanied By: self ?Schedule Follow-up Appointment: Yes ?Clinical Summary of Care: ?Electronic Signature(s) ?Signed: 07/22/2021 5:32:14 PM By: Deon Pilling RN, BSN ?Entered By: Deon Pilling on 07/22/2021 10:44:09 ?-------------------------------------------------------------------------------- ?Negative Pressure Wound Therapy Maintenance (NPWT) Details ?Patient Name: ?Date of Service: ?[place], [name] 07/22/2021 9:30 A M ?Medical Record Number: [id number] ?Patient Account Number: [id number] ?Date of Birth/Sex: ?Treating RN: ?1966/03/18 (56 y.o. M) [name], [name] ?Primary Care Alexismarie Flaim: [name] ?Other Clinician: ?Referring Hulen Mandler: ?Treating Cordie Buening/Extender: Linton Ham ?[name], [place] ?Weeks in Treatment: 16 ?NPWT Maintenance Performed for: Wound #1 Midline Back ?Performed By: Deon Pilling, RN ?Type: VAC System ?Coverage Size (sq cm): 14.25 ?Pressure Type: Constant ?Pressure Setting: 125 mmHG ?Drain Type: None ?Primary Contact: Non-Adherent ?Sponge/Dressing Type: ?Combination : x1 white foam and x1 black foam ?Date Initiated: 03/28/2021 ?Dressing Removed: Yes ?Quantity of Sponges/Gauze Removed: x1 white foam and x1 black foam ?Canister Changed: No ?Canister Exudate Volume: 25 ?Dressing Reapplied: Yes ?Quantity of Sponges/Gauze Inserted: x1 white foam; x1 black foam bridged to right side ?Respones T Treatment: ?o tolerated well ?Days On NPWT : 117 ?Electronic Signature(s) ?Signed: 07/22/2021 5:32:14 PM By: Deon Pilling RN, BSN ?Entered By: Deon Pilling on 07/22/2021 10:43:32 ?-------------------------------------------------------------------------------- ?Patient/Caregiver Education Details ?Patient Name: ?Date of Service: ?[place], [name] 4/10/2023andnbsp9:30 A M ?Medical Record Number: [id number] ?Patient Account Number: [id number] ?Date of Birth/Gender: ?Treating RN: ?[birth year] (56 y.o. M) [name], [name] ?Primary Care  Physician: [name] ?Other Clinician: ?Referring Physician: ?Treating Physician/Extender: Linton Ham ?[name], [place] ?Weeks in Treatment: 16 ?Education Assessment ?Education Provided To: ?Patient ?Education Topics Provided ?Wound/Skin Impairment: ?Handouts: Skin Care Do's and Dont's ?Methods: Explain/Verbal ?Responses: Reinforcements needed ?Electronic Signature(s) ?Signed: 07/22/2021 5:32:14 PM By: Deon Pilling RN, BSN ?Entered By: Deon Pilling on 07/22/2021 10:43:56 ?-------------------------------------------------------------------------------- ?Wound Assessment Details ?Patient Name: ?Date of Service: ?[place], [name] 07/22/2021 9:30 A M ?Medical Record Number: [id number] ?Patient Account Number: [id number] ?Date of Birth/Sex: ?  Treating RN: ?1966-02-17 (56 y.o. M) [name], [name] ?Primary Care Kaidon Kinker: [name] ?Other Clinician: ?Referring Pluma Diniz: ?Treating Xylia Scherger/Extender: Linton Ham ?[name], [place] ?Weeks in Treatment: 16 ?Wound Status ?Wound Number: 1 ?Primary Etiology: Open Surgical Wound ?Wound Location: Midline Back ?Wound Status: Open ?Wounding Event: Surgical Injury ?Date Acquired: 01/11/2021 ?Weeks Of Treatment: 16 ?Clustered Wound: No ?Wound Measurements ?Length: (cm) 9.5 ?Width: (cm) 1.5 ?Depth: (cm) 1.5 ?Area: (cm?) 11.192 ?Volume: (cm?) 16.788 ?% Reduction in Area: 85.2% ?% Reduction in Volume: 90.3% ?Wound Description ?Classification: Full Thickness With Exposed Support Structures ?Exudate Amount: Medium ?Exudate Type: Serosanguineous ?Exudate Color: red, brown ?Treatment Notes ?Wound #1 (Back) Wound Laterality: Midline ?Cleanser ?Peri-Wound Care ?Topical ?Primary Dressing ?wound VAC ?Secondary Dressing ?Secured With ?Compression Wrap ?Compression Stockings ?Add-Ons ?Electronic Signature(s) ?Signed: 07/22/2021 5:32:14 PM By: Deon Pilling RN, BSN ?Entered By: Deon Pilling on 07/22/2021 10:42:49 ?-------------------------------------------------------------------------------- ?Vitals  Details ?Patient Name: ?Date of Service: ?[place], [name] 07/22/2021 9:30 A M ?Medical Record Number: [id number] ?Patient Account Number: [id number] ?Date of Birth/Sex: ?Treating RN: ?1966-02-04 (56 y.o. M) [name], [name] ?Primary Care Davontae Prusinski: [name] ?Other Clinician: ?Referring Pasquale Matters: ?Treating Myria Steenbergen/Extender: Linton Ham ?[name], [place] ?Weeks in Treatment: 16 ?Vital Signs ?Time Taken: 09:40 ?Temperature (??F): 97.7 ?Pulse (bpm): 94 ?Respiratory Rate (breaths/min): 20 ?Blood Pressure (mmHg): 150/91 ?Reference Range: 80 - 120 mg / dl ?Electronic Signature(s) ?Signed: 07/22/2021 5:32:14 PM By: Deon Pilling RN, BSN ?Entered By: Deon Pilling on 07/22/2021 10:00:02 ?

## 2021-07-22 NOTE — Progress Notes (Signed)
FUTURE, YELDELL ([id number]) ?Visit Report for 07/22/2021 ?SuperBill Details ?Patient Name: Date of Service: ?[place], [place]. 07/22/2021 ?Medical Record Number: [id number] ?Patient Account Number: [id number] ?Date of Birth/Sex: Treating RN: ?[birth year] (56 y.o. 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

## 2021-07-25 ENCOUNTER — Encounter (HOSPITAL_BASED_OUTPATIENT_CLINIC_OR_DEPARTMENT_OTHER): Payer: No Typology Code available for payment source | Admitting: Internal Medicine

## 2021-07-25 ENCOUNTER — Ambulatory Visit: Payer: No Typology Code available for payment source

## 2021-07-25 DIAGNOSIS — L98424 Non-pressure chronic ulcer of back with necrosis of bone: Secondary | ICD-10-CM | POA: Diagnosis not present

## 2021-07-25 NOTE — Therapy (Incomplete)
?OUTPATIENT PHYSICAL THERAPY TREATMENT NOTE ? ? ?Patient Name: [name] ?MRN: [id number] ?DOB:Aug 28, 1965, 56 y.o., male ?Today's Date: 07/25/2021 ? ?PCP: Colon Branch, MD ?REFERRING PROVIDER: Bary Leriche, PA-C ? ? ? ? ? ? ? ? ? ? ? ? ? ? ? ?Past Medical History:  ?Diagnosis Date  ? DDD (degenerative disc disease), lumbar   ? Diabetes mellitus   ? Hyperlipemia   ? Hypertension   ? Obesity   ? OSA (obstructive sleep apnea)   ? on CPAP  ? ?Past Surgical History:  ?Procedure Laterality Date  ? APPLICATION OF ROBOTIC ASSISTANCE FOR SPINAL PROCEDURE N/A 01/11/2021  ? Procedure: APPLICATION OF ROBOTIC ASSISTANCE FOR SPINAL PROCEDURE;  Surgeon: Judith Part, MD;  Location: [place];  Service: Neurosurgery;  Laterality: N/A;  ? APPLICATION OF WOUND VAC N/A 01/25/2021  ? Procedure: APPLICATION OF WOUND VAC;  Surgeon: Dawley, Theodoro Doing, DO;  Location: [place];  Service: Neurosurgery;  Laterality: N/A;  ? BACK SURGERY    ? COLONOSCOPY  2019  ? LUMBAR WOUND DEBRIDEMENT N/A 01/25/2021  ? Procedure: Irrigation and Debridement of lumbar wound, and wound vacuum assisted closure;  Surgeon: Dawley, Theodoro Doing, DO;  Location: [place];  Service: Neurosurgery;  Laterality: N/A;  ? TRANSFORAMINAL LUMBAR INTERBODY FUSION (TLIF) WITH PEDICLE SCREW FIXATION 4 LEVEL N/A 01/11/2021  ? Procedure: Lumbar one-two, Lumbar two-three, Lumbar three-four, Lumbar four-five, Lumbar five Sacral one Open decompression, Transforaminal lumbar interbody fusion, posterolateral instrumented fusion;  Surgeon: Judith Part, MD;  Location: [place];  Service: Neurosurgery;  Laterality: N/A;  ? ?Patient Active Problem List  ? Diagnosis Date Noted  ? Presence of retained hardware 06/25/2021  ? Hardware complicating wound infection ([place]) 03/13/2021  ? Medication monitoring encounter 03/13/2021  ? Slow transit constipation   ? Disto-occlusion   ? Lumbar discitis   ? Epidural abscess   ? 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 Hypoalbuminemia due to protein-calorie malnutrition ([place])    ? Acute blood loss anemia   ? Other chronic postprocedural pain   ? 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 Klebsiella pneumoniae infection 01/28/2021  ? Wound infection after surgery 01/25/2021  ? Muscle spasms of both lower extremities 01/25/2021  ? Abdominal pain 01/25/2021  ? Spleen enlarged 01/25/2021  ? Lumbar radiculopathy 01/11/2021  ? Spasticity 12/03/2020  ? Neuropathy 09/28/2020  ? Chronic pain syndrome 09/28/2020  ? Intervertebral lumbar disc disorder with myelopathy, lumbar region 09/28/2020  ? Spondylosis, cervical, with myelopathy 09/28/2020  ? Unilateral primary osteoarthritis, right knee 10/28/2019  ? PCP NOTES >>>>>>>> 07/09/2015  ? DJD (degenerative joint disease) 10/27/2014  ? Screening for heart disease 06/28/2012  ? OSA (obstructive sleep apnea) 06/14/2012  ? ED (erectile dysfunction) 04/26/2012  ? Annual physical exam 12/29/2011  ? Achilles tendinitis 09/10/2010  ? Allergic rhinitis 07/12/2010  ? Hyperlipidemia 04/26/2007  ? DM II (diabetes mellitus, type II), controlled ([place]) 04/22/2006  ? OBESITY NOS 04/22/2006  ? Essential hypertension 04/22/2006  ? ? ?REFERRING DIAG:  ?M51.06 (ICD-10-CM) - Lumbar disc herniation with myelopathy  ? ?THERAPY DIAG:  ?No diagnosis found. ? ?PERTINENT HISTORY:  ?s/p L1-S1 decompression and instrumented fusion 01/11/21 ?Inpatient rehab: 01/18/21- 03/08/21 ?Incision and debridement of lumbar wound, placement of wound VAC 01/25/21 ?Wound care visits occurring on Monday and Thursday ? ?PRECAUTIONS: Wound and Falls ? ?SUBJECTIVE:  ?*** ? ?Pain Assessment: ?Are you having pain? Yes ?NPRS scale: 8/10 ?Pain location: low back  ?PAIN TYPE: stabbing, dull  ?Pain description: constant  ?  Aggravating factors: prolonged sitting  ?Relieving factors: laying supine  ? ? ?OBJECTIVE:  ? ?LE MMT: ?  ?MMT Right ?06/05/2021 Left ?06/05/2021  ?Hip flexion 3- 3-  ?Hip extension      ?Hip abduction      ?Hip adduction      ?Hip internal rotation      ?Hip external rotation       ?Knee flexion 4 4  ?Knee extension 4+ 4+  ?Ankle dorsiflexion 5 5  ?Ankle plantarflexion 4 4  ?Ankle inversion      ?Ankle eversion      ? (Blank rows = not tested) ?  ?FUNCTIONAL TESTS:  ?            5 x STS: 17 seconds use of BUE  ?           TUG: 25 seconds RW  ?           2 MWT: 284 ft CGA ?  ?  ?TODAY'S TREATMENT  ?[facility] Adult PT Treatment:                                                DATE: 07/25/2021 ?Therapeutic Exercise: ?NuStep lvl 7 UE/LE x 5 min ?Row 2x10 17#  ?Pallof press 2x10 7# ?Lateral walk in // x 3 laps ?STS from elevated table 3x10 ?LAQ 7.5# 2x15 each ?Standing hip abd/ext 2x10 each 2.5# ?Standing alt march no UE support 2x20 ?Neuromuscular Re-Ed: ?Tandem stance 3x30" each ?Amb in // x 3 laps no UE support ?Gait Training: ?Amb up/down 4 stairs x 10 with SPC and 1 UE assist on rail - step to pattern on descent  ? ?[facility] Adult PT Treatment:                                                DATE: 07/22/2021 ?Therapeutic Exercise: ?NuStep lvl 7 UE/LE x 5 min ?Row 2x10 17#  ?Pallof press 2x10 7# ?Lateral walk in // x 3 laps ?STS from elevated table 3x10 ?LAQ 7.5# 2x15 each ?Standing hip abd/ext 2x10 each 2.5# ?Standing alt march no UE support 2x20 ?Neuromuscular Re-Ed: ?Tandem stance 3x30" each ?Amb in // x 3 laps no UE support ?Gait Training: ?Amb up/down 4 stairs x 10 with SPC and 1 UE assist on rail - step to pattern on descent  ? ?[facility] Adult PT Treatment:                                                DATE: 07/18/2021 ?Therapeutic Exercise: ?NuStep lvl 6 UE/LE x 5 min ?STS from elevated table 3x10 ?LAQ 7.5# 2x10 each ?Standing hip abd/ext 2x10 each ?Standing hip ext x 10 each ?Neuromuscular Re-Ed: ?Tandem stance 3x30" each ?Gait Training: ?Amb 167f with SPC and PT CGA; cues for sequencing and trunk extension ?[facility]  [facility] ?Therapeutic Exercise: ?NuStep lvl 6 UE/LE x 5 min ?Paloff press 2x10 7# ?Standing hip abd 2x10 ?Standing hip  ext x 10 each ?Sit to stand from raised height with no UE support 2x10 ?LAQ 2x10 7.5# ?Seated hamstring curl 2x10 Black TB ?Gait Training: ?Amb 373f with SPC and PT CGA; cues for sequencing and trunk extension ?Amb 1888fwith SPC and PT CGA - pt exhibited scissoring gait when he became fatigued  ? ?PATIENT EDUCATION:  ?[facility] ?Person educated: patient ?Education method: instruction ?Education comprehension: verbalized understanding  ?  ?  ?HOME EXERCISE PROGRAM: ?Access Code: [email or web address]: [email or web address] ?Date: 06/27/2021 ?Prepared by: DaOctavio Manns ?Exercises ?Seated March - 2 x daily - 7 x weekly - 3 sets - 10 reps ?Seated Hip Abduction with Resistance - 2 x daily - 7 x weekly - 3 sets - 10 reps ?Seated Long Arc Quad - 2 x daily - 7 x weekly - 3 sets - 10 reps ?Seated Heel Raise - 2 x daily - 7 x weekly - 3 sets - 10 reps ?Standing Romberg to 1/2 Tandem Stance - 1 x daily - 7 x weekly - 3 sets - 30 sec hold ?Standing Tandem Balance with Counter Support - 1 x daily - 7 x weekly - 2 reps - 30 sec hold ?  ?  ?ASSESSMENT: ?  ?CLINICAL IMPRESSION: ?*** ?  ?OBJECTIVE IMPAIRMENTS Abnormal gait, decreased activity tolerance, decreased balance, decreased endurance, decreased mobility, difficulty walking, decreased ROM, decreased strength, impaired flexibility, improper body mechanics, postural dysfunction, obesity, and pain.  ?  ?ACTIVITY LIMITATIONS cleaning, community activity, driving, meal prep, occupation, laundry, shopping, and yard work.  ?  ?PERSONAL FACTORS Age, Fitness, and Time since onset of injury/illness/exacerbation are also affecting patient's functional outcome.  ?  ?  ?GOALS: ?Goals reviewed with patient? No ?  ?SHORT TERM GOALS: ?  ?STG Name Target Date Goal status  ?1 Patient will be independent and compliant with initial HEP.  ?  ?Baseline:  06/26/2021 MET  ?2 Patient will be able to complete sit <> stand transfer without use of  UE support.  ?Baseline:  ?MET - form higher surface 07/17/2021 MOSTLY MET  ?3 Patient will complete TUG in <15 seconds with LRAD to reduce his risk of future falls.  ?Baseline: 07/17/2021 ONGOING  ?4 Pa

## 2021-07-26 NOTE — Progress Notes (Signed)
YUVAN, MEDINGER ([id number]) ?Visit Report for 07/25/2021 ?Arrival Information Details ?Patient Name: Date of Service: ?[place], [name] 07/25/2021 9:30 A M ?Medical Record Number: [id number] ?Patient Account Number: [id number] ?Date of Birth/Sex: Treating RN: ?[birth year] (56 y.o. [name], [name] ?Primary Care Bethanny Toelle: [name] Other Clinician: ?Referring Theia Dezeeuw: ?Treating Hartman Minahan/Extender: Linton Ham ?[name], [place] ?Weeks in Treatment: 17 ?Visit Information History Since Last Visit ?Added or deleted any medications: No ?Patient Arrived: Gilford Rile ?Any new allergies or adverse reactions: No ?Arrival Time: 09:38 ?Had a fall or experienced change in No ?Accompanied By: friend ?activities of daily living that may affect ?Transfer Assistance: None ?risk of falls: ?Patient Identification Verified: Yes ?Signs or symptoms of abuse/neglect since last visito No ?Secondary Verification Process Completed: Yes ?Hospitalized since last visit: No ?Patient Requires Transmission-Based Precautions: No ?Implantable device outside of the clinic excluding No ?Patient Has Alerts: Yes ?cellular tissue based products placed in the center ?Patient Alerts: Patient on Blood Thinner since last visit: ?No BP Right Arm-PICC Has Dressing in Place as Prescribed: Yes ?Pain Present Now: Yes ?Electronic Signature(s) ?Signed: 07/26/2021 12:54:41 PM By: [name] RN ?Entered By: [name] on 07/25/2021 09:38:31 ?-------------------------------------------------------------------------------- ?Encounter Discharge Information Details ?Patient Name: Date of Service: ?[place], [name] 07/25/2021 9:30 A M ?Medical Record Number: [id number] ?Patient Account Number: [id number] ?Date of Birth/Sex: Treating RN: ?1965-11-10 (56 y.o. [name], [name] ?Primary Care Merve Hotard: [name] Other Clinician: ?Referring Latron Ribas: ?Treating Breon Rehm/Extender: Linton Ham ?[name], [place] ?Weeks in Treatment: 17 ?Encounter Discharge Information Items ?Discharge  Condition: Stable ?Ambulatory Status: Gilford Rile ?Discharge Destination: Home ?Transportation: Private Auto ?Accompanied By: friend ?Schedule Follow-up Appointment: Yes ?Clinical Summary of Care: Patient Declined ?Electronic Signature(s) ?Signed: 07/26/2021 12:54:41 PM By: [name] RN ?Entered By: [name] on 07/25/2021 10:55:26 ?-------------------------------------------------------------------------------- ?Negative Pressure Wound Therapy Maintenance (NPWT) Details ?Patient Name: ?Date of Service: ?[place], [name] 07/25/2021 9:30 A M ?Medical Record Number: [id number] ?Patient Account Number: [id number] ?Date of Birth/Sex: ?Treating RN: ?December 29, 1965 (56 y.o. [name], [name] ?Primary Care Tennille Montelongo: [name] ?Other Clinician: ?Referring Tiziana Cislo: ?Treating Kashtyn Jankowski/Extender: Linton Ham ?[name], [place] ?Weeks in Treatment: 17 ?NPWT Maintenance Performed for: Wound #1 Midline Back ?Performed By: [name], RN ?Type: VAC System ?Coverage Size (sq cm): 14.25 ?Pressure Type: Constant ?Pressure Setting: 125 mmHG ?Drain Type: None ?Primary Contact: Non-Adherent ?Sponge/Dressing Type: ?Combination : 1 white 1 black ?Date Initiated: 03/28/2021 ?Dressing Removed: Yes ?Quantity of Sponges/Gauze Removed: 1 white 1 black ?Canister Changed: Yes ?Canister Exudate Volume: 50 ?Dressing Reapplied: Yes ?Quantity of Sponges/Gauze Inserted: 1 white 1 black ?Respones T Treatment: ?o tolerates well ?Days On NPWT : 120 ?Electronic Signature(s) ?Signed: 07/26/2021 12:54:41 PM By: [name] RN ?Entered By: [name] on 07/25/2021 10:53:43 ?-------------------------------------------------------------------------------- ?Patient/Caregiver Education Details ?Patient Name: ?Date of Service: ?[place], [name] 4/13/2023andnbsp9:30 A M ?Medical Record Number: [id number] ?Patient Account Number: [id number] ?Date of Birth/Gender: ?Treating RN: ?[birth year] (56 y.o. [name], [name] ?Primary Care Physician: [name] ?Other Clinician: ?Referring Physician: ?Treating Physician/Extender: Linton Ham ?[name], [place] ?Weeks in Treatment: 17 ?Education Assessment ?Education Provided To: ?Patient ?Education Topics Provided ?Wound/Skin Impairment: ?Methods: Explain/Verbal ?Responses: Reinforcements needed, State content correctly ?Electronic Signature(s) ?Signed: 07/26/2021 12:54:41 PM By: [name] RN ?Entered By: [name] on 07/25/2021 10:55:04 ?-------------------------------------------------------------------------------- ?Wound Assessment Details ?Patient Name: ?Date of Service: ?[place], [name] 07/25/2021 9:30 A M ?Medical Record Number: [id number] ?Patient Account Number: [id number] ?Date of Birth/Sex: ?Treating RN: ?July 23, 1965 (56 y.o. [name], [name] ?Primary Care Kemar Pandit: [name] ?Other Clinician: ?Referring Shantanu Strauch: ?  Treating Marquelle Balow/Extender: Linton Ham ?[name], [place] ?Weeks in Treatment: 17 ?Wound Status ?Wound Number: 1 ?Primary Etiology: Open Surgical Wound ?Wound Location: Midline Back ?Wound Status: Open ?Wounding Event: Surgical Injury ?Date Acquired: 01/11/2021 ?Weeks Of Treatment: 17 ?Clustered Wound: No ?Wound Measurements ?Length: (cm) 9.5 ?Width: (cm) 1.5 ?Depth: (cm) 1.5 ?Area: (cm?) 11.192 ?Volume: (cm?) 16.788 ?% Reduction in Area: 85.2% ?% Reduction in Volume: 90.3% ?Wound Description ?Classification: Full Thickness With Exposed Support Structure ?Exudate Amount: Medium ?Exudate Type: Serosanguineous ?Exudate Color: red, brown ?s ?Treatment Notes ?Wound #1 (Back) Wound Laterality: Midline ?Cleanser ?Peri-Wound Care ?Topical ?Primary Dressing ?wound VAC ?Secondary Dressing ?Secured With ?Compression Wrap ?Compression Stockings ?Add-Ons ?Electronic Signature(s) ?Signed: 07/26/2021 12:54:41 PM By: [name] RN ?Entered By: [name] on 07/25/2021 10:51:38 ?-------------------------------------------------------------------------------- ?Vitals Details ?Patient  Name: ?Date of Service: ?[place], [name] 07/25/2021 9:30 A M ?Medical Record Number: [id number] ?Patient Account Number: [id number] ?Date of Birth/Sex: ?Treating RN: ?02-Jul-1965 (56 y.o. [name], [name] ?Primary Care Hersey Maclellan: [name] ?Other Clinician: ?Referring Arianah Torgeson: ?Treating Alijah Hyde/Extender: Linton Ham ?[name], [place] ?Weeks in Treatment: 17 ?Vital Signs ?Time Taken: 09:38 ?Temperature (??F): 97.7 ?Pulse (bpm): 99 ?Respiratory Rate (breaths/min): 17 ?Blood Pressure (mmHg): 124/80 ?Reference Range: 80 - 120 mg / dl ?Electronic Signature(s) ?Signed: 07/26/2021 12:54:41 PM By: [name] RN ?Entered By: [name] on 07/25/2021 09:38:52 ?

## 2021-07-26 NOTE — Progress Notes (Signed)
[name], [name] ([id number]) ?Visit Report for 07/25/2021 ?SuperBill Details ?Patient Name: Date of Service: ?[place], [name] 07/25/2021 ?Medical Record Number: [id number] ?Patient Account Number: [id number] ?Date of Birth/Sex: Treating RN: ?May 18, 1965 (56 y.o. 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

## 2021-07-29 ENCOUNTER — Ambulatory Visit: Payer: No Typology Code available for payment source

## 2021-07-29 ENCOUNTER — Encounter (HOSPITAL_BASED_OUTPATIENT_CLINIC_OR_DEPARTMENT_OTHER): Payer: No Typology Code available for payment source | Admitting: Internal Medicine

## 2021-07-29 DIAGNOSIS — G8929 Other chronic pain: Secondary | ICD-10-CM

## 2021-07-29 DIAGNOSIS — M545 Low back pain, unspecified: Secondary | ICD-10-CM | POA: Diagnosis not present

## 2021-07-29 DIAGNOSIS — L98424 Non-pressure chronic ulcer of back with necrosis of bone: Secondary | ICD-10-CM | POA: Diagnosis not present

## 2021-07-29 DIAGNOSIS — M6281 Muscle weakness (generalized): Secondary | ICD-10-CM

## 2021-07-29 NOTE — Progress Notes (Signed)
DREAM, NODAL ([id number]) ?Visit Report for 07/29/2021 ?Arrival Information Details ?Patient Name: Date of Service: ?[place], [name] 07/29/2021 9:30 A M ?Medical Record Number: [id number] ?Patient Account Number: [id number] ?Date of Birth/Sex: Treating RN: ?Apr 07, 1966 (56 y.o. [name], [name] ?Primary Care Neytiri Asche: [name] Other Clinician: ?Referring Adren Dollins: ?Treating Kenzo Ozment/Extender: Linton Ham ?[name], [place] ?Weeks in Treatment: 17 ?Visit Information History Since Last Visit ?Added or deleted any medications: No ?Patient Arrived: Gilford Rile ?Any new allergies or adverse reactions: No ?Arrival Time: 09:29 ?Had a fall or experienced change in No ?Accompanied By: friend ?activities of daily living that may affect ?Transfer Assistance: None ?risk of falls: ?Patient Identification Verified: Yes ?Signs or symptoms of abuse/neglect since last visito No ?Secondary Verification Process Completed: Yes ?Hospitalized since last visit: No ?Patient Requires Transmission-Based Precautions: No ?Implantable device outside of the clinic excluding No ?Patient Has Alerts: Yes ?cellular tissue based products placed in the center ?Patient Alerts: Patient on Blood Thinner since last visit: ?No BP Right Arm-PICC Has Dressing in Place as Prescribed: Yes ?Pain Present Now: Yes ?Electronic Signature(s) ?Signed: 07/29/2021 3:52:22 PM By: [name] RN ?Entered By: [name] on 07/29/2021 09:29:53 ?-------------------------------------------------------------------------------- ?Encounter Discharge Information Details ?Patient Name: Date of Service: ?[place], [name] 07/29/2021 9:30 A M ?Medical Record Number: [id number] ?Patient Account Number: [id number] ?Date of Birth/Sex: Treating RN: ?[birth year] (56 y.o. [name], [name] ?Primary Care Yentl Verge: [name] Other Clinician: ?Referring Shaheim Mahar: ?Treating Aashvi Rezabek/Extender: Linton Ham ?[name], [place] ?Weeks in Treatment: 17 ?Encounter Discharge Information Items ?Discharge  Condition: Stable ?Ambulatory Status: Gilford Rile ?Discharge Destination: Home ?Transportation: Private Auto ?Accompanied By: friend ?Schedule Follow-up Appointment: Yes ?Clinical Summary of Care: Patient Declined ?Electronic Signature(s) ?Signed: 07/29/2021 3:52:22 PM By: [name] RN ?Entered By: [name] on 07/29/2021 10:16:07 ?-------------------------------------------------------------------------------- ?Lower Extremity Assessment Details ?Patient Name: ?Date of Service: ?[place], [name] 07/29/2021 9:30 A M ?Medical Record Number: [id number] ?Patient Account Number: [id number] ?Date of Birth/Sex: ?Treating RN: ?Apr 16, 1965 (56 y.o. [name], [name] ?Primary Care Woodruff Skirvin: [name] ?Other Clinician: ?Referring Prentice Sackrider: ?Treating Anaiza Behrens/Extender: Linton Ham ?[name], [place] ?Weeks in Treatment: 17 ?Electronic Signature(s) ?Signed: 07/29/2021 3:52:22 PM By: [name] RN ?Entered By: [name] on 07/29/2021 09:30:59 ?-------------------------------------------------------------------------------- ?Multi Wound Chart Details ?Patient Name: ?Date of Service: ?[place], [name] 07/29/2021 9:30 A M ?Medical Record Number: [id number] ?Patient Account Number: [id number] ?Date of Birth/Sex: ?Treating RN: ?05-Jun-1965 (56 y.o. [name], [name] ?Primary Care Raevyn Sokol: [name] ?Other Clinician: ?Referring Warner Laduca: ?Treating Egidio Lofgren/Extender: Linton Ham ?[name], [place] ?Weeks in Treatment: 17 ?Vital Signs ?Height(in): ?Pulse(bpm): 102 ?Weight(lbs): ?Blood Pressure(mmHg): 131/80 ?Body Mass Index(BMI): ?Temperature(??F): 98.7 ?Respiratory Rate(breaths/min): 17 ?Photos: [N/A:N/A] ?Midline Back N/A N/A ?Wound Location: ?Surgical Injury N/A N/A ?Wounding Event: ?Open Surgical Wound N/A N/A ?Primary Etiology: ?Hypertension, Type II Diabetes, N/A N/A ?Comorbid History: ?Osteomyelitis ?01/11/2021 N/A N/A ?Date Acquired: ?76 N/A N/A ?Weeks of Treatment: ?Open N/A N/A ?Wound Status: ?No N/A N/A ?Wound  Recurrence: ?9.5x1.5x1.7 N/A N/A ?Measurements L x W x D (cm) ?11.192 N/A N/A ?A (cm?) : ?rea ?19.026 N/A N/A ?Volume (cm?) : ?85.20% N/A N/A ?% Reduction in A rea: ?89.10% N/A N/A ?% Reduction in Volume: ?12 ?Position 1 (o'clock): ?2.5 ?Maximum Distance 1 (cm): ?Yes N/A N/A ?Tunneling: ?Full Thickness With Exposed Support N/A N/A ?Classification: ?Structures ?Medium N/A N/A ?Exudate Amount: ?Serosanguineous N/A N/A ?Exudate Type: ?red, brown N/A N/A ?Exudate Color: ?Distinct, outline attached N/A N/A ?Wound Margin: ?Large (67-100%) N/A N/A ?Granulation Amount: ?Red, Pink N/A N/A ?Granulation Quality: ?Small (1-33%) N/A N/A ?  Necrotic Amount: ?Fascia: No N/A N/A ?Exposed Structures: ?Fat Layer (Subcutaneous Tissue): No ?Tendon: No ?Muscle: No ?Joint: No ?Bone: No ?Negative Pressure Wound Therapy N/A N/A ?Procedures Performed: ?Maintenance (NPWT) ?Treatment Notes ?Electronic Signature(s) ?Signed: 07/29/2021 3:52:22 PM By: [name] RN ?Signed: 07/29/2021 3:52:38 PM By: Linton Ham MD ?Entered By: Linton Ham on 07/29/2021 09:51:36 ?-------------------------------------------------------------------------------- ?Multi-Disciplinary Care Plan Details ?Patient Name: ?Date of Service: ?[place], [name] 07/29/2021 9:30 A M ?Medical Record Number: [id number] ?Patient Account Number: [id number] ?Date of Birth/Sex: ?Treating RN: ?31-Jul-1965 (56 y.o. [name], [name] ?Primary Care Schwanda Zima: [name] ?Other Clinician: ?Referring Paxon Propes: ?Treating Zakira Ressel/Extender: Linton Ham ?[name], [place] ?Weeks in Treatment: 17 ?Multidisciplinary Care Plan reviewed with physician ?Active Inactive ?Wound/Skin Impairment ?Nursing Diagnoses: ?Impaired tissue integrity ?Goals: ?Patient/caregiver will verbalize understanding of skin care regimen ?Date Initiated: 03/28/2021 ?Target Resolution Date: 08/10/2021 ?Goal Status: Active ?Ulcer/skin breakdown will have a volume reduction of 30% by week 4 ?Date Initiated: 03/28/2021 ?Date  Inactivated: 05/27/2021 ?Target Resolution Date: 05/31/2021 ?Goal Status: Met ?Interventions: ?Assess patient/caregiver ability to obtain necessary supplies ?Assess patient/caregiver ability to perform ulcer/skin care regimen upon admission and as needed ?Assess ulceration(s) every visit ?Provide education on ulcer and skin care ?Treatment Activities: ?Topical wound management initiated : 03/28/2021 ?Notes: ?Electronic Signature(s) ?Signed: 07/29/2021 3:52:22 PM By: [name] RN ?Entered By: [name] on 07/29/2021 09:50:26 ?-------------------------------------------------------------------------------- ?Negative Pressure Wound Therapy Maintenance (NPWT) Details ?Patient Name: ?Date of Service: ?[place], [name] 07/29/2021 9:30 A M ?Medical Record Number: [id number] ?Patient Account Number: [id number] ?Date of Birth/Sex: ?Treating RN: ?[birth year] (56 y.o. [name], [name] ?Primary Care Tiney Zipper: [name] ?Other Clinician: ?Referring Yashvi Jasinski: ?Treating Cesar Rogerson/Extender: Linton Ham ?[name], [place] ?Weeks in Treatment: 17 ?NPWT Maintenance Performed for: Wound #1 Midline Back ?Performed By: [name], RN ?Type: VAC System ?Coverage Size (sq cm): 14.25 ?Pressure Type: Constant ?Pressure Setting: 125 mmHG ?Drain Type: None ?Primary Contact: Non-Adherent ?Sponge/Dressing Type: ?Combination : 1 white 1 black ?Date Initiated: 03/28/2021 ?Dressing Removed: Yes ?Quantity of Sponges/Gauze Removed: 1 white 1 black ?Canister Changed: No ?Canister Exudate Volume: 25 ?Dressing Reapplied: Yes ?Quantity of Sponges/Gauze Inserted: 1 white 1 black ?Respones T Treatment: ?o tolerated well ?Days On NPWT : 124 ?Post Procedure Diagnosis ?Same as Pre-procedure ?Electronic Signature(s) ?Signed: 07/29/2021 3:52:22 PM By: [name] RN ?Entered By: [name] on 07/29/2021 09:48:14 ?-------------------------------------------------------------------------------- ?Pain Assessment Details ?Patient  Name: ?Date of Service: ?[place], [name] 07/29/2021 9:30 A M ?Medical Record Number: [id number] ?Patient Account Number: [id number] ?Date of Birth/Sex: ?Treating RN: ?[birth year] (56 y.o. [name], [name] ?Primary Car

## 2021-07-29 NOTE — Therapy (Signed)
?OUTPATIENT PHYSICAL THERAPY TREATMENT NOTE ? ? ?Patient Name: [name] ?MRN: [id number] ?DOB:[birth year], 56 y.o., male ?Today's Date: 07/29/2021 ? ?PCP: Colon Branch, MD ?REFERRING PROVIDER: Bary Leriche, PA-C ? ? PT End of Session - 07/29/21 1409   ? ? Visit Number 15   ? Number of Visits 25   ? Date for PT Re-Evaluation 08/31/21   ? Authorization Type [facility]   ? PT Start Time 1406   ? PT Stop Time 7619   ? PT Time Calculation (min) 38 min   ? Equipment Utilized During Treatment Other (comment);Gait belt   SPC  ? Activity Tolerance Patient tolerated treatment well   ? Behavior During Therapy [facility] for tasks assessed/performed   ? ?  ?  ? ?  ? ? ? ? ? ? ? ? ? ? ? ? ? ? ?Past Medical History:  ?Diagnosis Date  ? DDD (degenerative disc disease), lumbar   ? Diabetes mellitus   ? Hyperlipemia   ? Hypertension   ? Obesity   ? OSA (obstructive sleep apnea)   ? on CPAP  ? ?Past Surgical History:  ?Procedure Laterality Date  ? APPLICATION OF ROBOTIC ASSISTANCE FOR SPINAL PROCEDURE N/A 01/11/2021  ? Procedure: APPLICATION OF ROBOTIC ASSISTANCE FOR SPINAL PROCEDURE;  Surgeon: Judith Part, MD;  Location: [place];  Service: Neurosurgery;  Laterality: N/A;  ? APPLICATION OF WOUND VAC N/A 01/25/2021  ? Procedure: APPLICATION OF WOUND VAC;  Surgeon: Dawley, Theodoro Doing, DO;  Location: [place];  Service: Neurosurgery;  Laterality: N/A;  ? BACK SURGERY    ? COLONOSCOPY  2019  ? LUMBAR WOUND DEBRIDEMENT N/A 01/25/2021  ? Procedure: Irrigation and Debridement of lumbar wound, and wound vacuum assisted closure;  Surgeon: Dawley, Theodoro Doing, DO;  Location: [place];  Service: Neurosurgery;  Laterality: N/A;  ? TRANSFORAMINAL LUMBAR INTERBODY FUSION (TLIF) WITH PEDICLE SCREW FIXATION 4 LEVEL N/A 01/11/2021  ? Procedure: Lumbar one-two, Lumbar two-three, Lumbar three-four, Lumbar four-five, Lumbar five Sacral one Open decompression, Transforaminal lumbar interbody fusion, posterolateral instrumented fusion;  Surgeon: Judith Part, MD;   [facility];  Service: Neurosurgery;  Laterality: N/A;  ? ?Patient Active Problem List  ? Diagnosis Date Noted  ? Presence of retained hardware 06/25/2021  ? Hardware complicating wound infection ([place]) 03/13/2021  ? Medication monitoring encounter 03/13/2021  ? Slow transit constipation   ? Disto-occlusion   ? Lumbar discitis   ? Epidural abscess   ? 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 Hypoalbuminemia due to protein-calorie malnutrition ([place])   ? Acute blood loss anemia   ? Other chronic postprocedural pain   ? 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 Klebsiella pneumoniae infection 01/28/2021  ? Wound infection after surgery 01/25/2021  ? Muscle spasms of both lower extremities 01/25/2021  ? Abdominal pain 01/25/2021  ? Spleen enlarged 01/25/2021  ? Lumbar radiculopathy 01/11/2021  ? Spasticity 12/03/2020  ? Neuropathy 09/28/2020  ? Chronic pain syndrome 09/28/2020  ? Intervertebral lumbar disc disorder with myelopathy, lumbar region 09/28/2020  ? Spondylosis, cervical, with myelopathy 09/28/2020  ? Unilateral primary osteoarthritis, right knee 10/28/2019  ? PCP NOTES >>>>>>>> 07/09/2015  ? DJD (degenerative joint disease) 10/27/2014  ? Screening for heart disease 06/28/2012  ? OSA (obstructive sleep apnea) 06/14/2012  ? ED (erectile dysfunction) 04/26/2012  ? Annual physical exam 12/29/2011  ? Achilles tendinitis 09/10/2010  ? Allergic rhinitis 07/12/2010  ? Hyperlipidemia 04/26/2007  ? DM II (diabetes mellitus, type II), controlled (  [place]) 04/22/2006  ? OBESITY NOS 04/22/2006  ? Essential hypertension 04/22/2006  ? ? ?REFERRING DIAG:  ?M51.06 (ICD-10-CM) - Lumbar disc herniation with myelopathy  ? ?THERAPY DIAG:  ?Chronic bilateral low back pain, unspecified whether sciatica present ? ?Muscle weakness (generalized) ? ?PERTINENT HISTORY:  ?s/p L1-S1 decompression and instrumented fusion 01/11/21 ?Inpatient rehab: 01/18/21- 03/08/21 ?Incision and debridement of lumbar wound, placement of wound VAC 01/25/21 ?Wound  care visits occurring on Monday and Thursday ? ?PRECAUTIONS: Wound and Falls ? ?SUBJECTIVE:  ?Pt presents to PT with reports of decreased lower back pain. Notes that he did well ambulating at his work for a dinner the other day, using SPC only. He has been compliant with HEP with no adverse effect. Pt is ready to begin PT at this time.  ? ?Pain Assessment: ?Are you having pain? Yes ?NPRS scale: 8/10 ?Pain location: low back  ?PAIN TYPE: stabbing, dull  ?Pain description: constant  ?Aggravating factors: prolonged sitting  ?Relieving factors: laying supine  ? ? ?OBJECTIVE:  ? ?LE MMT: ?  ?MMT Right ?06/05/2021 Left ?06/05/2021  ?Hip flexion 3- 3-  ?Hip extension      ?Hip abduction      ?Hip adduction      ?Hip internal rotation      ?Hip external rotation      ?Knee flexion 4 4  ?Knee extension 4+ 4+  ?Ankle dorsiflexion 5 5  ?Ankle plantarflexion 4 4  ?Ankle inversion      ?Ankle eversion      ? (Blank rows = not tested) ?  ?FUNCTIONAL TESTS:  ?            5 x STS: 17 seconds use of BUE  ?           TUG: 25 seconds RW  ?           2 MWT: 284 ft CGA ?  ?  ?TODAY'S TREATMENT  ?[facility] Adult PT Treatment:                                                DATE: 07/29/2021 ?Therapeutic Exercise: ?NuStep lvl 7 UE/LE x 4 min ?Row 2x10 17#  ?Pallof press 2x10 7# ?STS from elevated table 2x10 ?Standing hip abd 2x10 25# ?Seated knee extension 3x10 15# ?Seated hamstring curl 3x10 45# ?Neuromuscular Re-Ed: ?Tandem stance 3x30" each ?Tandem walk x 2 laps in // ?Amb in // x 3 laps no UE support ?Lateral walk in // x 3 laps no UE support ? ?[facility] Adult PT Treatment:                                                DATE: 07/22/2021 ?Therapeutic Exercise: ?NuStep lvl 7 UE/LE x 5 min ?Row 2x10 17#  ?Pallof press 2x10 7# ?Lateral walk in // x 3 laps ?STS from elevated table 3x10 ?LAQ 7.5# 2x15 each ?Standing hip abd/ext 2x10 each 2.5# ?Standing alt march no UE support 2x20 ?Neuromuscular Re-Ed: ?Tandem stance 3x30" each ?Amb in // x 3 laps no UE  support ?Gait Training: ?Amb up/down 4 stairs x 10 with SPC and 1 UE assist on rail - step to pattern on descent  ? ?[facility] Adult PT Treatment:  DATE: 07/18/2021 ?Therapeutic Exercise: ?NuStep lvl 6 UE/LE x 5 min ?STS from elevated table 3x10 ?LAQ 7.5# 2x10 each ?Standing hip abd/ext 2x10 each ?Standing hip ext x 10 each ?Neuromuscular Re-Ed: ?Tandem stance 3x30" each ?Gait Training: ?Amb 148f with SPC and PT CGA; cues for sequencing and trunk extension ?[facility]                                                [facility] ?Therapeutic Exercise: ?NuStep lvl 6 UE/LE x 5 min ?Paloff press 2x10 7# ?Standing hip abd 2x10 ?Standing hip ext x 10 each ?Sit to stand from raised height with no UE support 2x10 ?LAQ 2x10 7.5# ?Seated hamstring curl 2x10 Black TB ?Gait Training: ?Amb 3718fwith SPC and PT CGA; cues for sequencing and trunk extension ?Amb 18537fith SPC and PT CGA - pt exhibited scissoring gait when he became fatigued  ? ?PATIENT EDUCATION:  ?Education details: [facility] ?Person educated: patient ?Education method: instruction ?Education comprehension: verbalized understanding  ?  ?  ?HOME EXERCISE PROGRAM: ?Access Code: [email or web address]: [email or web address] ?Date: 06/27/2021 ?Prepared by: DavOctavio Manns?Exercises ?Seated March - 2 x daily - 7 x weekly - 3 sets - 10 reps ?Seated Hip Abduction with Resistance - 2 x daily - 7 x weekly - 3 sets - 10 reps ?Seated Long Arc Quad - 2 x daily - 7 x weekly - 3 sets - 10 reps ?Seated Heel Raise - 2 x daily - 7 x weekly - 3 sets - 10 reps ?Standing Romberg to 1/2 Tandem Stance - 1 x daily - 7 x weekly - 3 sets - 30 sec hold ?Standing Tandem Balance with Counter Support - 1 x daily - 7 x weekly - 2 reps - 30 sec hold ?  ?  ?ASSESSMENT: ?  ?CLINICAL IMPRESSION: ?Pt was able to complete all prescribed exercises with no adverse effect. Therapy today continued to progress core  and proximal hip strength in order to improve functional mobility and decreasing pain. He continues to progress very well with therapy and will continue be seen and progressed for gait and strengthening.

## 2021-07-29 NOTE — Progress Notes (Signed)
MCLEAN, MOYA ([id number]) ?Visit Report for 07/29/2021 ?HPI Details ?Patient Name: Date of Service: ?[place], [name] 07/29/2021 9:30 A M ?Medical Record Number: [id number] ?Patient Account Number: [id number] ?Date of Birth/Sex: Treating RN: ?11-11-1965 (56 y.o. [name], [name] ?Primary Care Provider: [name] Other Clinician: ?Referring Provider: ?Treating Provider/Extender: Linton Ham ?[name], [place] ?Weeks in Treatment: 17 ?History of Present Illness ?HPI Description: Admission 03/28/2021 ?Mr. Carroll Lingelbach is a 56 year old male with a past medical history of controlled type 2 diabetes on oral agents, obesity and OSA that presents to the clinic for a ?back wound. On 01/11/2021 patient had a laminectomy with PLIF of L1-S1 by Dr. Venetia Constable because of lumbar stenosis and radiculopathy. He subsequently ?developed bacteremia. He had CT imaging on 10/13 of the lumbar spine that showed fluid collection in the soft tissue of the posterior L1 and S1 and was taken ?to the OR for washout on 10/14. 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 He received 4 weeks of IV antibiotics by infectious ?disease. After his completion of 4 weeks of IV antibiotics he was continued for an additional 4 weeks of IV cefazolin with a stop date of 12/29. He has been ?evaluated by plastic surgery and no plans for surgical intervention at this time. Wife is present and reports he has been on the wound VAC for the past 8 ?weeks with improvement in wound healing. He currently denies systemic signs of infection. ?12/22; patient presents for follow-up. He reports no issues since last clinic visit. He denies signs of infection. He has been tolerating the wound VAC well. ?12/30; patient presents for follow-up. He reports no issues and has no complaints today. He has been tolerating the wound VAC well. ?1/9; patient presents for follow-up. He has no issues or complaints today. He states he feels well. He has had no problems  with the wound VAC. ?1/16; patient presents for follow-up. He continues to use the wound VAC with no issues. He denies signs of infection. ?1/23; patient presents for follow-up. He has been switched from IV cefazolin to oral cefadroxil by infectious disease. He has no issues or complaints today. He ?denies signs of infection. He continues to tolerate the wound VAC well. ?1/30; patient presents for follow-up. He continues to tolerate the wound VAC well. ?2/6; patient presents for follow-up. He has no issues or complaints today. He continues to tolerate the wound VAC well. He denies signs of infection. ?2/13; patient presents for follow-up. He continues to do well with the wound VAC. He denies any issues. ?2/27; patient presents for follow-up. He continues to use the wound VAC without any issues. He denies signs of infection. ?3/20; patient presents for follow-up. He has no issues or complaints today. He continues to use the wound VAC. ?4/3; patient presents for follow-up. He continues to use the wound VAC without issues. He denies signs of infection. ?4/17; 2-week follow-up. He continues to do well. His measurements are improved. Initially a surgical wound complicated by infection. ?Electronic Signature(s) ?Signed: 07/29/2021 3:52:38 PM By: Linton Ham MD ?Entered By: Linton Ham on 07/29/2021 09:52:22 ?-------------------------------------------------------------------------------- ?Physical Exam Details ?Patient Name: Date of Service: ?[name], [name] 07/29/2021 9:30 A M ?Medical Record Number: [id number] ?Patient Account Number: [id number] ?Date of Birth/Sex: Treating RN: ?[birth year] (56 y.o. [name], [name] ?Primary Care Provider: [name] Other Clinician: ?Referring Provider: ?Treating Provider/Extender: Linton Ham ?[name], [place] ?Weeks in Treatment: 17 ?Constitutional ?Sitting or standing Blood Pressure is within target range for  patient.. Pulse regular and within target range for patient.Marland Kitchen  Respirations regular, non-labored and ?within target range.. Temperature is normal and within the target range for the patient.Marland Kitchen Appears in no distress. ?Notes ?Wound exam; lumbar spine. Surface tissue looks excellent. On the right side this is completely granulated on the left there is depth and he has a 2.5 cm tunnel ?at 12:00. All of this cleans up quite nicely. No debridement is necessary. Dimensions are improved. No evidence of surrounding infection ?Electronic Signature(s) ?Signed: 07/29/2021 3:52:38 PM By: Linton Ham MD ?Entered By: Linton Ham on 07/29/2021 09:53:36 ?-------------------------------------------------------------------------------- ?Physician Orders Details ?Patient Name: Date of Service: ?[place], [name] 07/29/2021 9:30 A M ?Medical Record Number: [id number] ?Patient Account Number: [id number] ?Date of Birth/Sex: Treating RN: ?27-Mar-1966 (56 y.o. [name], [name] ?Primary Care Provider: [name] Other Clinician: ?Referring Provider: ?Treating Provider/Extender: Linton Ham ?[name], [place] ?Weeks in Treatment: 17 ?Verbal / Phone Orders: No ?Diagnosis Coding ?Follow-up Appointments ?ppointment in 2 weeks. - w/ Dr. Heber Chili and Allayne Butcher Room # 9 ?Return A ?Nurse Visit: - Thursday 08/01/21 , Monday 04/24 Thursday 04/27 ?Bathing/ Shower/ Hygiene ?May shower with protection but do not get wound dressing(s) wet. ?Negative Presssure Wound Therapy ?Wound Vac to wound continuously at 168m/hg pressure ?Black Foam - wound base then bridge to right side ?White Foam - T tunnel at 12:00 ?o ?Additional Orders / Instructions ?Follow Nutritious Diet - Continue to monitor blood sugars daily ?Wound Treatment ?Electronic Signature(s) ?Signed: 07/29/2021 3:52:22 PM By: BRhae HammockRN ?Signed: 07/29/2021 3:52:38 PM By: RLinton HamMD ?Entered By: BRhae Hammockon 07/29/2021 09:50:18 ?-------------------------------------------------------------------------------- ?Problem List Details ?Patient  Name: Date of Service: ?[place], [name] 07/29/2021 9:30 A M ?Medical Record Number: [id number]?Patient Account Number: [id number]?Date of Birth/Sex: Treating RN: ?[birth year]((56y.o. 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 07/29/2021 9:30 A M ?Medical Record Number: [id number]?Patient Account Number: [id number]?Date of Birth/Sex: Treating RN: ?312-12-1965((56y.o. MBurnadette Pop [name] ?Primary Care Provider: PKathlene NovemberOther Clinician: ?Referring Provider: ?Treating Provider/Extender: RLinton Ham?[place]arose [place]?Weeks in Treatment: 17 ?Subjective ?History of Present Illness (HPI) ?Admission 03/28/2021 ?Mr. CIzak Andingis a 56year old male with a past medical history of controlled type 2 diabetes on oral agents, obesity and OSA that presents to the clinic for a ?back wound. On 01/11/2021 patient had a laminectomy with PLIF of L1-S1 by Dr. OVenetia Constablebecause of lumbar stenosis and radiculopathy. He subsequently ?developed bacteremia. He had CT imaging on 10/13 of the lumbar spine that showed fluid collection in the soft tissue of the posterior L1 and S1 and was taken ?to the OR for washout on 10/14. MR of the lumbar spine on 02/09/2021 showed osteomyelitis at  the L1-2. He received 4 weeks of IV antibiotics by infectious ?disease. After his completion of 4 weeks of IV antibiotics he was continued for an additional 4 weeks of IV cefazolin with a stop date of 12/29. He has  b

## 2021-08-01 ENCOUNTER — Encounter (HOSPITAL_BASED_OUTPATIENT_CLINIC_OR_DEPARTMENT_OTHER): Payer: No Typology Code available for payment source | Admitting: Internal Medicine

## 2021-08-01 ENCOUNTER — Ambulatory Visit: Payer: No Typology Code available for payment source

## 2021-08-01 ENCOUNTER — Ambulatory Visit: Payer: No Typology Code available for payment source | Admitting: Internal Medicine

## 2021-08-01 DIAGNOSIS — M6281 Muscle weakness (generalized): Secondary | ICD-10-CM

## 2021-08-01 DIAGNOSIS — G8929 Other chronic pain: Secondary | ICD-10-CM

## 2021-08-01 DIAGNOSIS — M545 Low back pain, unspecified: Secondary | ICD-10-CM | POA: Diagnosis not present

## 2021-08-01 DIAGNOSIS — L98424 Non-pressure chronic ulcer of back with necrosis of bone: Secondary | ICD-10-CM | POA: Diagnosis not present

## 2021-08-01 NOTE — Therapy (Signed)
?OUTPATIENT PHYSICAL THERAPY TREATMENT NOTE ? ? ?Patient Name: [name] ?MRN: [id number] ?DOB:1966-02-20, 56 y.o., male ?Today's Date: 08/01/2021 ? ?PCP: Colon Branch, MD ?REFERRING PROVIDER: Bary Leriche, PA-C ? ? PT End of Session - 08/01/21 1357   ? ? Visit Number 16   ? Number of Visits 25   ? Date for PT Re-Evaluation 08/31/21   ? Authorization Type [facility]   ? PT Start Time 1400   ? PT Stop Time 1438   ? PT Time Calculation (min) 38 min   ? Equipment Utilized During Treatment Other (comment);Gait belt   SPC  ? Activity Tolerance Patient tolerated treatment well   ? Behavior During Therapy [facility] for tasks assessed/performed   ? ?  ?  ? ?  ? ? ? ? ? ? ? ? ? ? ? ? ? ? ? ?Past Medical History:  ?Diagnosis Date  ? DDD (degenerative disc disease), lumbar   ? Diabetes mellitus   ? Hyperlipemia   ? Hypertension   ? Obesity   ? OSA (obstructive sleep apnea)   ? on CPAP  ? ?Past Surgical History:  ?Procedure Laterality Date  ? APPLICATION OF ROBOTIC ASSISTANCE FOR SPINAL PROCEDURE N/A 01/11/2021  ? Procedure: APPLICATION OF ROBOTIC ASSISTANCE FOR SPINAL PROCEDURE;  Surgeon: Judith Part, MD;  Location: [place];  Service: Neurosurgery;  Laterality: N/A;  ? APPLICATION OF WOUND VAC N/A 01/25/2021  ? Procedure: APPLICATION OF WOUND VAC;  Surgeon: Dawley, Theodoro Doing, DO;  Location: [place];  Service: Neurosurgery;  Laterality: N/A;  ? BACK SURGERY    ? COLONOSCOPY  2019  ? LUMBAR WOUND DEBRIDEMENT N/A 01/25/2021  ? Procedure: Irrigation and Debridement of lumbar wound, and wound vacuum assisted closure;  Surgeon: Dawley, Theodoro Doing, DO;  Location: [place];  Service: Neurosurgery;  Laterality: N/A;  ? TRANSFORAMINAL LUMBAR INTERBODY FUSION (TLIF) WITH PEDICLE SCREW FIXATION 4 LEVEL N/A 01/11/2021  ? Procedure: Lumbar one-two, Lumbar two-three, Lumbar three-four, Lumbar four-five, Lumbar five Sacral one Open decompression, Transforaminal lumbar interbody fusion, posterolateral instrumented fusion;  Surgeon: Judith Part, MD;   Location: [place];  Service: Neurosurgery;  Laterality: N/A;  ? ?Patient Active Problem List  ? Diagnosis Date Noted  ? Presence of retained hardware 06/25/2021  ? Hardware complicating wound infection ([place]) 03/13/2021  ? Medication monitoring encounter 03/13/2021  ? Slow transit constipation   ? Disto-occlusion   ? Lumbar discitis   ? Epidural abscess   ? 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 Hypoalbuminemia due to protein-calorie malnutrition ([place])   ? Acute blood loss anemia   ? Other chronic postprocedural pain   ? 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 Klebsiella pneumoniae infection 01/28/2021  ? Wound infection after surgery 01/25/2021  ? Muscle spasms of both lower extremities 01/25/2021  ? Abdominal pain 01/25/2021  ? Spleen enlarged 01/25/2021  ? Lumbar radiculopathy 01/11/2021  ? Spasticity 12/03/2020  ? Neuropathy 09/28/2020  ? Chronic pain syndrome 09/28/2020  ? Intervertebral lumbar disc disorder with myelopathy, lumbar region 09/28/2020  ? Spondylosis, cervical, with myelopathy 09/28/2020  ? Unilateral primary osteoarthritis, right knee 10/28/2019  ? PCP NOTES >>>>>>>> 07/09/2015  ? DJD (degenerative joint disease) 10/27/2014  ? Screening for heart disease 06/28/2012  ? OSA (obstructive sleep apnea) 06/14/2012  ? ED (erectile dysfunction) 04/26/2012  ? Annual physical exam 12/29/2011  ? Achilles tendinitis 09/10/2010  ? Allergic rhinitis 07/12/2010  ? Hyperlipidemia 04/26/2007  ? DM II (diabetes mellitus, type II),  controlled ([place]) 04/22/2006  ? OBESITY NOS 04/22/2006  ? Essential hypertension 04/22/2006  ? ? ?REFERRING DIAG:  ?M51.06 (ICD-10-CM) - Lumbar disc herniation with myelopathy  ? ?THERAPY DIAG:  ?Chronic bilateral low back pain, unspecified whether sciatica present ? ?Muscle weakness (generalized) ? ?PERTINENT HISTORY:  ?s/p L1-S1 decompression and instrumented fusion 01/11/21 ?Inpatient rehab: 01/18/21- 03/08/21 ?Incision and debridement of lumbar wound, placement of wound VAC 01/25/21 ?Wound  care visits occurring on Monday and Thursday ? ?PRECAUTIONS: [place] ? ?SUBJECTIVE:  ?Pt presents to PT with reports of continued decrease in lower back pain. Has been compliant with HEP with no adverse effect. Pt is ready to begin PT at this time.  ? ?Pain Assessment: ?Are you having pain? Yes ?NPRS scale: 8/10 ?Pain location: low back  ?PAIN TYPE: stabbing, dull  ?Pain description: constant  ?Aggravating factors: prolonged sitting  ?Relieving factors: laying supine  ? ? ?OBJECTIVE:  ? ?LE MMT: ?  ?MMT Right ?06/05/2021 Left ?06/05/2021  ?Hip flexion 3- 3-  ?Hip extension      ?Hip abduction      ?Hip adduction      ?Hip internal rotation      ?Hip external rotation      ?Knee flexion 4 4  ?Knee extension 4+ 4+  ?Ankle dorsiflexion 5 5  ?Ankle plantarflexion 4 4  ?Ankle inversion      ?Ankle eversion      ? (Blank rows = not tested) ?  ?FUNCTIONAL TESTS:  ?            5 x STS: 17 seconds use of BUE  ?           TUG: 25 seconds RW  ?           2 MWT: 284 ft CGA ?  ?  ?TODAY'S TREATMENT  ?[facility] Adult PT Treatment:                                                DATE: 08/01/2021 ?Therapeutic Exercise: ?NuStep lvl 7 UE/LE x 4 min ?Row 2x10 17#  ?Pallof press 2x10 10# ?STS from elevated table 2x10 ?Standing hip abd/ext 2x10 25# ?Seated knee extension 3x15 20# ?Seated hamstring curl 3x15 45# ?Neuromuscular Re-Ed: ?Hurdle step overs (6) x 2 fwd/lateral in // ?Tandem walk x 2 laps in // ?Amb in // x 4 laps no UE support ?Lateral walk in // x 3 laps no UE support ? ?[facility] Adult PT Treatment:                                                DATE: 07/29/2021 ?Therapeutic Exercise: ?NuStep lvl 7 UE/LE x 4 min ?Row 2x10 17#  ?Pallof press 2x10 7# ?STS from elevated table 2x10 ?Standing hip abd 2x10 25# ?Seated knee extension 3x10 15# ?Seated hamstring curl 3x10 45# ?Neuromuscular Re-Ed: ?Tandem stance 3x30" each ?Tandem walk x 2 laps in // ?Amb in // x 3 laps no UE support ?Lateral walk in // x 3 laps no UE support ? ?[facility] Adult  PT Treatment:  DATE: 07/22/2021 ?Therapeutic Exercise: ?NuStep lvl 7 UE/LE x 5 min ?Row 2x10 17#  ?Pallof press 2x10 7# ?Lateral walk in // x 3 laps ?STS from elevated table 3x10 ?LAQ 7.5# 2x15 each ?Standing hip abd/ext 2x10 each 2.5# ?Standing alt march no UE support 2x20 ?Neuromuscular Re-Ed: ?Tandem stance 3x30" each ?Amb in // x 3 laps no UE support ?Gait Training: ?Amb up/down 4 stairs x 10 with SPC and 1 UE assist on rail - step to pattern on descent  ? ?PATIENT EDUCATION:  ?Education details: utilized [facility] for household ambulation ?Person educated: patient ?Education method: instruction ?Education comprehension: verbalized understanding  ?  ?  ?HOME EXERCISE PROGRAM: ?Access Code: [place] ?URL: [email or web address] ?Date: 06/27/2021 ?Prepared by: Octavio Manns ? ?Exercises ?Seated March - 2 x daily - 7 x weekly - 3 sets - 10 reps ?Seated Hip Abduction with Resistance - 2 x daily - 7 x weekly - 3 sets - 10 reps ?Seated Long Arc Quad - 2 x daily - 7 x weekly - 3 sets - 10 reps ?Seated Heel Raise - 2 x daily - 7 x weekly - 3 sets - 10 reps ?Standing Romberg to 1/2 Tandem Stance - 1 x daily - 7 x weekly - 3 sets - 30 sec hold ?Standing Tandem Balance with Counter Support - 1 x daily - 7 x weekly - 2 reps - 30 sec hold ?  ?  ?ASSESSMENT: ?  ?CLINICAL IMPRESSION: ?Pt was able to complete all prescribed exercises with no adverse effect or increase in pain. Therapy continued to progress core and proximal hip strengthening as well as balance and gait stability. He continues to progress very well with therapy, with improving strength, balance, and functional activity tolerance. He continues to benefit from skilled PT services and will continue to be seen and progressed as able. ?  ?OBJECTIVE IMPAIRMENTS Abnormal gait, decreased activity tolerance, decreased balance, decreased endurance, decreased mobility, difficulty walking, decreased ROM, decreased strength,  impaired flexibility, improper body mechanics, postural dysfunction, obesity, and pain.  ?  ?ACTIVITY LIMITATIONS cleaning, community activity, driving, meal prep, occupation, laundry, shopping, and

## 2021-08-01 NOTE — Progress Notes (Signed)
CROIX, PRESLEY ([id number]) ?Visit Report for 08/01/2021 ?SuperBill Details ?Patient Name: Date of Service: ?[place], [place]. 08/01/2021 ?Medical Record Number: [id number] ?Patient Account Number: [id number] ?Date of Birth/Sex: Treating RN: ?06/27/1965 (56 y.o. 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

## 2021-08-01 NOTE — Progress Notes (Signed)
ASPEN, DETERDING ([id number]) ?Visit Report for 08/01/2021 ?Arrival Information Details ?Patient Name: Date of Service: ?[name], [name] 08/01/2021 12:30 PM ?Medical Record Number: [id number] ?Patient Account Number: [id number] ?Date of Birth/Sex: Treating RN: ?[birth year] (56 y.o. [name], [name] ?Primary Care [name]: [name] Other Clinician: ?Referring [name]: ?Treating [name]/Extender: [name] ?[name], [name] ?Weeks in Treatment: 18 ?Visit Information History Since Last Visit ?Added or deleted any medications: No ?Patient Arrived: [name] ?Any new allergies or adverse reactions: No ?Arrival Time: 13:14 ?Had a fall or experienced change in No ?Accompanied By: wif ?activities of daily living that may affect ?Transfer Assistance: None ?risk of falls: ?Patient Identification Verified: Yes ?Signs or symptoms of abuse/neglect since last visito No ?Secondary Verification Process Completed: Yes ?Hospitalized since last visit: No ?Patient Requires Transmission-Based Precautions: No ?Implantable device outside of the clinic excluding No ?Patient Has Alerts: Yes ?cellular tissue based products placed in the center ?Patient Alerts: Patient on Blood Thinner since last visit: ?No BP Right Arm-PICC Has Dressing in Place as Prescribed: Yes ?Pain Present Now: Yes ?Electronic Signature(s) ?Signed: 08/01/2021 5:27:50 PM By: Christian Hammock RN ?Entered By: [name] on 08/01/2021 13:14:48 ?-------------------------------------------------------------------------------- ?Encounter Discharge Information Details ?Patient Name: Date of Service: ?[name], [name] 08/01/2021 12:30 PM ?Medical Record Number: [id number] ?Patient Account Number: [id number] ?Date of Birth/Sex: Treating RN: ?05/24/1965 (57 y.o. [name], [name] ?Primary Care [name]: [name] Other Clinician: ?Referring [name]: ?Treating Kalim Kissel/Extender: [name] ?[name], [name] ?Weeks in Treatment: 18 ?Encounter Discharge Information Items ?Discharge  Condition: Stable ?Ambulatory Status: [name] ?Discharge Destination: Home ?Transportation: Private Auto ?Accompanied By: [name] ?Schedule Follow-up Appointment: Yes ?Clinical Summary of Care: Patient Declined ?Electronic Signature(s) ?Signed: 08/01/2021 5:27:50 PM By: Christian Hammock RN ?Entered By: [name] on 08/01/2021 13:16:49 ?-------------------------------------------------------------------------------- ?Negative Pressure Wound Therapy Maintenance (NPWT) Details ?Patient Name: ?Date of Service: ?[name], [name] 08/01/2021 12:30 PM ?Medical Record Number: [id number] ?Patient Account Number: [id number] ?Date of Birth/Sex: ?Treating RN: ?[birth year] (56 y.o. [name], [name] ?Primary Care Geovanny Sartin: [name] ?Other Clinician: ?Referring [name]: ?Treating [name]/Extender: [name] ?[name], [name] ?Weeks in Treatment: 18 ?NPWT Maintenance Performed for: Wound #1 Midline Back ?Performed By: Christian Hammock, RN ?Type: VAC System ?Coverage Size (sq cm): 14.25 ?Pressure Type: Constant ?Pressure Setting: 125 mmHG ?Drain Type: None ?Primary Contact: Non-Adherent ?Sponge/Dressing Type: ?Combination : 1 white 1 black ?Date Initiated: 03/28/2021 ?Dressing Removed: Yes ?Quantity of Sponges/Gauze Removed: 1 white 1 black ?Canister Changed: Yes ?Canister Exudate Volume: 74 ?Dressing Reapplied: Yes ?Quantity of Sponges/Gauze Inserted: 1 white 1 black ?Respones T Treatment: ?o tolerates well ?Days On NPWT : 127 ?Electronic Signature(s) ?Signed: 08/01/2021 5:27:50 PM By: Christian Hammock RN ?Entered By: [name] on 08/01/2021 13:16:08 ?-------------------------------------------------------------------------------- ?Patient/Caregiver Education Details ?Patient Name: ?Date of Service: ?[name], [name] 4/20/2023andnbsp12:30 PM ?Medical Record Number: [id number] ?Patient Account Number: [id number] ?Date of Birth/Gender: ?Treating RN: ?October 05, 1965 (56 y.o. [name], [name] ?Primary Care Physician: [name] ?Other Clinician: ?Referring Physician: ?Treating Physician/Extender: [name] ?[name], [name] ?Weeks in Treatment: 18 ?Education Assessment ?Education Provided To: ?Patient ?Education Topics Provided ?Wound/Skin Impairment: ?Methods: Explain/Verbal ?Responses: State content correctly ?Electronic Signature(s) ?Signed: 08/01/2021 5:27:50 PM By: Christian Hammock RN ?Entered By: [name] on 08/01/2021 13:16:33 ?-------------------------------------------------------------------------------- ?Wound Assessment Details ?Patient Name: ?Date of Service: ?[name], [name] 08/01/2021 12:30 PM ?Medical Record Number: [id number] ?Patient Account Number: [id number] ?Date of Birth/Sex: ?Treating RN: ?14-Apr-1966 (56 y.o. [name], [name] ?Primary Care [name]: [name] ?Other Clinician: ?Referring Breann Losano: ?Treating [name]/Extender: [name] ?[name], [place] ?Weeks  in Treatment: 18 ?Wound Status ?Wound Number: 1 ?Primary Etiology: Open Surgical Wound ?Wound Location: Midline Back ?Wound Status: Open ?Wounding Event: Surgical Injury ?Date Acquired: 01/11/2021 ?Weeks Of Treatment: 18 ?Clustered Wound: No ?Wound Measurements ?Length: (cm) 9.5 ?Width: (cm) 1.5 ?Depth: (cm) 1.7 ?Area: (cm?) 11.192 ?Volume: (cm?) 19.026 ?% Reduction in Area: 85.2% ?% Reduction in Volume: 89.1% ?Wound Description ?Classification: Full Thickness With Exposed Support Structure ?Exudate Amount: Medium ?Exudate Type: Serosanguineous ?Exudate Color: red, brown ?s ?Treatment Notes ?Wound #1 (Back) Wound Laterality: Midline ?Cleanser ?Peri-Wound Care ?Topical ?Primary Dressing ?Secondary Dressing ?Secured With ?Compression Wrap ?Compression Stockings ?Add-Ons ?Electronic Signature(s) ?Signed: 08/01/2021 5:27:50 PM By: Christian Hammock RN ?Entered By: [name] on 08/01/2021 13:15:22 ?-------------------------------------------------------------------------------- ?Vitals Details ?Patient Name: ?Date of Service: ?[place], [name]  08/01/2021 12:30 PM ?Medical Record Number: [id number] ?Patient Account Number: [id number] ?Date of Birth/Sex: ?Treating RN: ?[birth year] (56 y.o. [name], [name] ?Primary Care Jahon Bart: [name] ?Other Clinician: ?Referring Yelena Metzer: ?Treating Anjeanette Petzold/Extender: [name] ?[name], [place] ?Weeks in Treatment: 18 ?Vital Signs ?Time Taken: 13:14 ?Temperature (??F): 98.7 ?Pulse (bpm): 101 ?Respiratory Rate (breaths/min): 17 ?Blood Pressure (mmHg): 124/74 ?Reference Range: 80 - 120 mg / dl ?Electronic Signature(s) ?Signed: 08/01/2021 5:27:50 PM By: Christian Hammock RN ?Entered By: [name] on 08/01/2021 13:15:08 ?

## 2021-08-04 ENCOUNTER — Other Ambulatory Visit: Payer: Self-pay | Admitting: Physical Medicine and Rehabilitation

## 2021-08-05 ENCOUNTER — Ambulatory Visit: Payer: No Typology Code available for payment source

## 2021-08-05 ENCOUNTER — Other Ambulatory Visit (HOSPITAL_COMMUNITY): Payer: Self-pay

## 2021-08-05 ENCOUNTER — Encounter (HOSPITAL_BASED_OUTPATIENT_CLINIC_OR_DEPARTMENT_OTHER): Payer: No Typology Code available for payment source | Admitting: Internal Medicine

## 2021-08-05 DIAGNOSIS — L98424 Non-pressure chronic ulcer of back with necrosis of bone: Secondary | ICD-10-CM | POA: Diagnosis not present

## 2021-08-05 DIAGNOSIS — M6281 Muscle weakness (generalized): Secondary | ICD-10-CM

## 2021-08-05 DIAGNOSIS — M545 Low back pain, unspecified: Secondary | ICD-10-CM

## 2021-08-05 MED ORDER — CITALOPRAM HYDROBROMIDE 20 MG PO TABS
20.0000 mg | ORAL_TABLET | Freq: Every day | ORAL | 1 refills | Status: DC
  Filled 2021-08-05: qty 90, 90d supply, fill #0

## 2021-08-05 NOTE — Therapy (Signed)
?OUTPATIENT PHYSICAL THERAPY TREATMENT NOTE ? ? ?Patient Name: [name] ?MRN: [id number] ?DOB:Nov 08, 1965, 56 y.o., male ?Today's Date: 08/05/2021 ? ?PCP: Colon Branch, MD ?REFERRING PROVIDER: Bary Leriche, PA-C ? ? PT End of Session - 08/05/21 1355   ? ? Visit Number 17   ? Number of Visits 25   ? Date for PT Re-Evaluation 08/31/21   ? Authorization Type [facility]   ? PT Start Time 1355   ? PT Stop Time 7371   ? PT Time Calculation (min) 38 min   ? Equipment Utilized During Treatment Other (comment);Gait belt   SPC  ? Activity Tolerance Patient tolerated treatment well   ? Behavior During Therapy [facility] for tasks assessed/performed   ? ?  ?  ? ?  ? ? ? ? ? ? ? ? ? ? ? ? ? ? ? ?Past Medical History:  ?Diagnosis Date  ? DDD (degenerative disc disease), lumbar   ? Diabetes mellitus   ? Hyperlipemia   ? Hypertension   ? Obesity   ? OSA (obstructive sleep apnea)   ? on CPAP  ? ?Past Surgical History:  ?Procedure Laterality Date  ? APPLICATION OF ROBOTIC ASSISTANCE FOR SPINAL PROCEDURE N/A 01/11/2021  ? Procedure: APPLICATION OF ROBOTIC ASSISTANCE FOR SPINAL PROCEDURE;  Surgeon: Judith Part, MD;  Location: [place];  Service: Neurosurgery;  Laterality: N/A;  ? APPLICATION OF WOUND VAC N/A 01/25/2021  ? Procedure: APPLICATION OF WOUND VAC;  Surgeon: Dawley, Theodoro Doing, DO;  Location: [place];  Service: Neurosurgery;  Laterality: N/A;  ? BACK SURGERY    ? COLONOSCOPY  2019  ? LUMBAR WOUND DEBRIDEMENT N/A 01/25/2021  ? Procedure: Irrigation and Debridement of lumbar wound, and wound vacuum assisted closure;  Surgeon: Dawley, Theodoro Doing, DO;  Location: [place];  Service: Neurosurgery;  Laterality: N/A;  ? TRANSFORAMINAL LUMBAR INTERBODY FUSION (TLIF) WITH PEDICLE SCREW FIXATION 4 LEVEL N/A 01/11/2021  ? Procedure: Lumbar one-two, Lumbar two-three, Lumbar three-four, Lumbar four-five, Lumbar five Sacral one Open decompression, Transforaminal lumbar interbody fusion, posterolateral instrumented fusion;  Surgeon: Judith Part, MD;   Location: [place];  Service: Neurosurgery;  Laterality: N/A;  ? ?Patient Active Problem List  ? Diagnosis Date Noted  ? Presence of retained hardware 06/25/2021  ? Hardware complicating wound infection ([place]) 03/13/2021  ? Medication monitoring encounter 03/13/2021  ? Slow transit constipation   ? Disto-occlusion   ? Lumbar discitis   ? Epidural abscess   ? 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 Hypoalbuminemia due to protein-calorie malnutrition ([place])   ? Acute blood loss anemia   ? Other chronic postprocedural pain   ? 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 Klebsiella pneumoniae infection 01/28/2021  ? Wound infection after surgery 01/25/2021  ? Muscle spasms of both lower extremities 01/25/2021  ? Abdominal pain 01/25/2021  ? Spleen enlarged 01/25/2021  ? Lumbar radiculopathy 01/11/2021  ? Spasticity 12/03/2020  ? Neuropathy 09/28/2020  ? Chronic pain syndrome 09/28/2020  ? Intervertebral lumbar disc disorder with myelopathy, lumbar region 09/28/2020  ? Spondylosis, cervical, with myelopathy 09/28/2020  ? Unilateral primary osteoarthritis, right knee 10/28/2019  ? PCP NOTES >>>>>>>> 07/09/2015  ? DJD (degenerative joint disease) 10/27/2014  ? Screening for heart disease 06/28/2012  ? OSA (obstructive sleep apnea) 06/14/2012  ? ED (erectile dysfunction) 04/26/2012  ? Annual physical exam 12/29/2011  ? Achilles tendinitis 09/10/2010  ? Allergic rhinitis 07/12/2010  ? Hyperlipidemia 04/26/2007  ? DM II (diabetes mellitus, type II),  controlled ([place]) 04/22/2006  ? OBESITY NOS 04/22/2006  ? Essential hypertension 04/22/2006  ? ? ?REFERRING DIAG:  ?M51.06 (ICD-10-CM) - Lumbar disc herniation with myelopathy  ? ?THERAPY DIAG:  ?Chronic bilateral low back pain, unspecified whether sciatica present ? ?Muscle weakness (generalized) ? ?PERTINENT HISTORY:  ?s/p L1-S1 decompression and instrumented fusion 01/11/21 ?Inpatient rehab: 01/18/21- 03/08/21 ?Incision and debridement of lumbar wound, placement of wound VAC 01/25/21 ?Wound  care visits occurring on Monday and Thursday ? ?PRECAUTIONS: Wound and Falls ? ?SUBJECTIVE:  ?Pt presents to PT with reports of lower back pain and discomfort. Has been compliant with HEP with no adverse effect. Pt is ready to begin PT at this time.  ? ?Pain Assessment: ?Are you having pain? Yes ?NPRS scale: 7/10 ?Pain location: low back  ?PAIN TYPE: stabbing, dull  ?Pain description: constant  ?Aggravating factors: prolonged sitting  ?Relieving factors: laying supine  ? ? ?OBJECTIVE:  ? ?LE MMT: ?  ?MMT Right ?06/05/2021 Left ?06/05/2021  ?Hip flexion 3- 3-  ?Hip extension      ?Hip abduction      ?Hip adduction      ?Hip internal rotation      ?Hip external rotation      ?Knee flexion 4 4  ?Knee extension 4+ 4+  ?Ankle dorsiflexion 5 5  ?Ankle plantarflexion 4 4  ?Ankle inversion      ?Ankle eversion      ? (Blank rows = not tested) ?  ?FUNCTIONAL TESTS:  ?            5 x STS: 17 seconds use of BUE  ?           TUG: 25 seconds RW  ?           2 MWT: 284 ft CGA ?  ?  ?TODAY'S TREATMENT  ?[facility] Adult PT Treatment:                                                DATE: 08/05/2021 ?Therapeutic Exercise: ?NuStep lvl 7 UE/LE x 5 min ?Step ups fwd 2x10 each 8in ?Row 2x10 20#  ?Shoulder ext 2x10 20# ?Pallof press 2x10 10# ?STS from elevated table 2x10 ?Standing hip abd/ext 2x10 25# ?Seated knee extension 3x15 20# ?Seated hamstring curl 3x15 45# ?Neuromuscular Re-Ed: ?Hurdle step overs (6) x 2 fwd/lateral in // ?Amb in // x 4 laps no UE support ? ?[facility] Adult PT Treatment:                                                DATE: 08/01/2021 ?Therapeutic Exercise: ?NuStep lvl 7 UE/LE x 4 min ?Row 2x10 17#  ?Pallof press 2x10 10# ?STS from elevated table 2x10 ?Standing hip abd/ext 2x10 25# ?Seated knee extension 3x15 20# ?Seated hamstring curl 3x15 45# ?Neuromuscular Re-Ed: ?Hurdle step overs (6) x 2 fwd/lateral in // ?Tandem walk x 2 laps in // ?Amb in // x 4 laps no UE support ?Lateral walk in // x 3 laps no UE support ? ?[facility] Adult PT  Treatment:  DATE: 07/29/2021 ?Therapeutic Exercise: ?NuStep lvl 7 UE/LE x 4 min ?Row 2x10 17#  ?Pallof press 2x10 7# ?STS from elevated table 2x10 ?Standing hip abd 2x10 25# ?Seated knee extension 3x10 15# ?Seated hamstring curl 3x10 45# ?Neuromuscular Re-Ed: ?Tandem stance 3x30" each ?Tandem walk x 2 laps in // ?Amb in // x 3 laps no UE support ?Lateral walk in // x 3 laps no UE support ? ?PATIENT EDUCATION:  ?Education details: utilized [facility] for household ambulation ?Person educated: patient ?Education method: instruction ?Education comprehension: verbalized understanding  ?  ?  ?HOME EXERCISE PROGRAM: ?Access Code: [place] ?URL: [email or web address] ?Date: 06/27/2021 ?Prepared by: Octavio Manns ? ?Exercises ?Seated March - 2 x daily - 7 x weekly - 3 sets - 10 reps ?Seated Hip Abduction with Resistance - 2 x daily - 7 x weekly - 3 sets - 10 reps ?Seated Long Arc Quad - 2 x daily - 7 x weekly - 3 sets - 10 reps ?Seated Heel Raise - 2 x daily - 7 x weekly - 3 sets - 10 reps ?Standing Romberg to 1/2 Tandem Stance - 1 x daily - 7 x weekly - 3 sets - 30 sec hold ?Standing Tandem Balance with Counter Support - 1 x daily - 7 x weekly - 2 reps - 30 sec hold ?  ?  ?ASSESSMENT: ?  ?CLINICAL IMPRESSION: ?Pt was able to complete all prescribed exercises with no adverse effect or increase in pain. Therapy continued to progress core and proximal hip strengthening as well as balance and gait stability. He continues to progress very well with therapy, with improving strength, balance, and functional activity tolerance. He continues to benefit from skilled PT services and will continue to be seen and progressed as able. ?  ?OBJECTIVE IMPAIRMENTS Abnormal gait, decreased activity tolerance, decreased balance, decreased endurance, decreased mobility, difficulty walking, decreased ROM, decreased strength, impaired flexibility, improper body mechanics, postural dysfunction,  obesity, and pain.  ?  ?ACTIVITY LIMITATIONS cleaning, community activity, driving, meal prep, occupation, laundry, shopping, and yard work.  ?  ?PERSONAL FACTORS Age, Fitness, and Time since onset of

## 2021-08-05 NOTE — Progress Notes (Signed)
DAMAN, STEFFENHAGEN ([id number]) ?Visit Report for 08/05/2021 ?Arrival Information Details ?Patient Name: Date of Service: ?[place], [name] 08/05/2021 9:30 A M ?Medical Record Number: [id number] ?Patient Account Number: [id number] ?Date of Birth/Sex: Treating RN: ?July 06, 1965 (56 y.o. [name], [name] ?Primary Care Christianna Belmonte: [name] Other Clinician: ?Referring Kaleya Douse: ?Treating Rhyann Berton/Extender: Kalman Shan ?[name], [place] ?Weeks in Treatment: 18 ?Visit Information History Since Last Visit ?Added or deleted any medications: No ?Patient Arrived: [name] ?Any new allergies or adverse reactions: No ?Arrival Time: 11:49 ?Had a fall or experienced change in No ?Accompanied By: friend ?activities of daily living that may affect ?Transfer Assistance: None ?risk of falls: ?Patient Identification Verified: Yes ?Signs or symptoms of abuse/neglect since last visito No ?Secondary Verification Process Completed: Yes ?Hospitalized since last visit: No ?Patient Requires Transmission-Based Precautions: No ?Implantable device outside of the clinic excluding No ?Patient Has Alerts: Yes ?cellular tissue based products placed in the center ?Patient Alerts: Patient on Blood Thinner since last visit: ?No BP Right Arm-PICC Has Dressing in Place as Prescribed: Yes ?Pain Present Now: Yes ?Electronic Signature(s) ?Signed: 08/05/2021 12:30:28 PM By: [name] RN ?Entered By: [name] on 08/05/2021 11:50:06 ?-------------------------------------------------------------------------------- ?Encounter Discharge Information Details ?Patient Name: Date of Service: ?[place], [name] 08/05/2021 9:30 A M ?Medical Record Number: [id number] ?Patient Account Number: [id number] ?Date of Birth/Sex: Treating RN: ?Feb 16, 1966 (56 y.o. [name], [name] ?Primary Care Baltazar Pekala: [name] Other Clinician: ?Referring Saraiya Kozma: ?Treating Tajh Livsey/Extender: Kalman Shan ?[name], [place] ?Weeks in Treatment: 18 ?Encounter Discharge Information Items ?Discharge  Condition: Stable ?Ambulatory Status: [name] ?Discharge Destination: Home ?Transportation: Private Auto ?Accompanied By: self ?Schedule Follow-up Appointment: Yes ?Clinical Summary of Care: Patient Declined ?Electronic Signature(s) ?Signed: 08/05/2021 12:30:28 PM By: [name] RN ?Entered By: [name] on 08/05/2021 11:52:34 ?-------------------------------------------------------------------------------- ?Negative Pressure Wound Therapy Maintenance (NPWT) Details ?Patient Name: ?Date of Service: ?[place], [name] 08/05/2021 9:30 A M ?Medical Record Number: [id number] ?Patient Account Number: [id number] ?Date of Birth/Sex: ?Treating RN: ?October 30, 1965 (56 y.o. [name], [name] ?Primary Care Ellason Segar: [name] ?Other Clinician: ?Referring Haitham Dolinsky: ?Treating Jasper Hanf/Extender: Kalman Shan ?[name], [place] ?Weeks in Treatment: 18 ?NPWT Maintenance Performed for: Wound #1 Midline Back ?Performed By: [name], RN ?Type: VAC System ?Coverage Size (sq cm): 14.25 ?Pressure Type: Constant ?Pressure Setting: 125 mmHG ?Drain Type: None ?Primary Contact: Non-Adherent ?Sponge/Dressing Type: ?Combination : 1 white 1 black ?Date Initiated: 03/28/2021 ?Dressing Removed: Yes ?Quantity of Sponges/Gauze Removed: 1 white 1 black ?Canister Changed: No ?Canister Exudate Volume: 25 ?Dressing Reapplied: Yes ?Quantity of Sponges/Gauze Inserted: 1 white 1 black ?Respones T Treatment: ?o tolerates well ?Days On NPWT : 131 ?Electronic Signature(s) ?Signed: 08/05/2021 12:30:28 PM By: [name] RN ?Entered By: [name] on 08/05/2021 11:51:56 ?-------------------------------------------------------------------------------- ?Patient/Caregiver Education Details ?Patient Name: ?Date of Service: ?[place], [name] 4/24/2023andnbsp9:30 A M ?Medical Record Number: [id number] ?Patient Account Number: [id number] ?Date of Birth/Gender: ?Treating RN: ?[birth year] (56 y.o. [name], [name] ?Primary Care Physician: [name] ?Other Clinician: ?Referring Physician: ?Treating Physician/Extender: Kalman Shan ?[name], [place] ?Weeks in Treatment: 18 ?Education Assessment ?Education Provided To: ?Patient ?Education Topics Provided ?Wound/Skin Impairment: ?Methods: Explain/Verbal ?Responses: Reinforcements needed, State content correctly ?Electronic Signature(s) ?Signed: 08/05/2021 12:30:28 PM By: [name] RN ?Entered By: [name] on 08/05/2021 11:52:18 ?-------------------------------------------------------------------------------- ?Wound Assessment Details ?Patient Name: ?Date of Service: ?[place], [name] 08/05/2021 9:30 A M ?Medical Record Number: [id number] ?Patient Account Number: [id number] ?Date of Birth/Sex: ?Treating RN: ?Aug 22, 1965 (56 y.o. [name], [name] ?Primary Care Pascuala Klutts: [name] ?Other Clinician: ?Referring Jennie Hannay: ?  Treating Ozzie Knobel/Extender: Kalman Shan ?[name], [place] ?Weeks in Treatment: 18 ?Wound Status ?Wound Number: 1 ?Primary Etiology: Open Surgical Wound ?Wound Location: Midline Back ?Wound Status: Open ?Wounding Event: Surgical Injury ?Date Acquired: 01/11/2021 ?Weeks Of Treatment: 18 ?Clustered Wound: No ?Wound Measurements ?Length: (cm) 9.5 ?Width: (cm) 1.5 ?Depth: (cm) 1.7 ?Area: (cm?) 11.192 ?Volume: (cm?) 19.026 ?% Reduction in Area: 85.2% ?% Reduction in Volume: 89.1% ?Wound Description ?Classification: Full Thickness With Exposed Support Structure ?Exudate Amount: Medium ?Exudate Type: Serosanguineous ?Exudate Color: red, brown ?s ?Treatment Notes ?Wound #1 (Back) Wound Laterality: Midline ?Cleanser ?Peri-Wound Care ?Topical ?Primary Dressing ?Secondary Dressing ?Secured With ?Compression Wrap ?Compression Stockings ?Add-Ons ?Electronic Signature(s) ?Signed: 08/05/2021 12:30:28 PM By: [name] RN ?Entered By: [name] on 08/05/2021 11:51:13 ?-------------------------------------------------------------------------------- ?Vitals Details ?Patient Name: ?Date of  Service: ?[place], [name] 08/05/2021 9:30 A M ?Medical Record Number: [id number] ?Patient Account Number: [id number] ?Date of Birth/Sex: ?Treating RN: ?[birth year] (56 y.o. [name], [name] ?Primary Care Deon Ivey: [name] ?Other Clinician: ?Referring Samwise Eckardt: ?Treating Victory Strollo/Extender: Kalman Shan ?[name], [place] ?Weeks in Treatment: 18 ?Vital Signs ?Time Taken: 09:30 ?Temperature (??F): 98.7 ?Pulse (bpm): 74 ?Respiratory Rate (breaths/min): 17 ?Blood Pressure (mmHg): 124/74 ?Reference Range: 80 - 120 mg / dl ?Electronic Signature(s) ?Signed: 08/05/2021 12:30:28 PM By: [name] RN ?Entered By: [name] on 08/05/2021 11:50:21 ?

## 2021-08-05 NOTE — Progress Notes (Signed)
AQIB, LOUGH ([id number]) ?Visit Report for 08/05/2021 ?SuperBill Details ?Patient Name: Date of Service: ?[place], [place]. 08/05/2021 ?Medical Record Number: [id number] ?Patient Account Number: [id number] ?Date of Birth/Sex: Treating RN: ?04-28-1965 (56 y.o. 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

## 2021-08-06 ENCOUNTER — Other Ambulatory Visit (HOSPITAL_COMMUNITY): Payer: Self-pay

## 2021-08-06 ENCOUNTER — Ambulatory Visit (INDEPENDENT_AMBULATORY_CARE_PROVIDER_SITE_OTHER): Payer: No Typology Code available for payment source | Admitting: Internal Medicine

## 2021-08-06 ENCOUNTER — Encounter: Payer: Self-pay | Admitting: Internal Medicine

## 2021-08-06 VITALS — BP 126/78 | HR 108 | Temp 98.0°F | Resp 18 | Ht 74.0 in | Wt 259.5 lb

## 2021-08-06 DIAGNOSIS — N529 Male erectile dysfunction, unspecified: Secondary | ICD-10-CM | POA: Diagnosis not present

## 2021-08-06 DIAGNOSIS — R000 Tachycardia, unspecified: Secondary | ICD-10-CM | POA: Diagnosis not present

## 2021-08-06 DIAGNOSIS — E118 Type 2 diabetes mellitus with unspecified complications: Secondary | ICD-10-CM

## 2021-08-06 LAB — TSH: TSH: 1.94 u[IU]/mL (ref 0.35–5.50)

## 2021-08-06 LAB — HEMOGLOBIN A1C: Hgb A1c MFr Bld: 5.2 % (ref 4.6–6.5)

## 2021-08-06 MED ORDER — MORPHINE SULFATE ER 15 MG PO TBCR
30.0000 mg | EXTENDED_RELEASE_TABLET | Freq: Two times a day (BID) | ORAL | 0 refills | Status: DC
  Filled 2021-08-06: qty 60, 15d supply, fill #0

## 2021-08-06 MED ORDER — TADALAFIL 20 MG PO TABS
10.0000 mg | ORAL_TABLET | ORAL | 3 refills | Status: DC | PRN
  Filled 2021-08-06: qty 30, 60d supply, fill #0

## 2021-08-06 MED ORDER — TADALAFIL 20 MG PO TABS: 10.0000 mg | ORAL_TABLET | ORAL | 3 refills | Status: DC | PRN

## 2021-08-06 NOTE — Progress Notes (Signed)
? ?Subjective:  ? ? Patient ID: [name], male    DOB: [birth year], 56 y.o.   MRN: [id number] ? ?DOS:  08/06/2021 ?Type of visit - description: Follow-up ? ?Since the last office visit he is doing well. ?Has no major concerns. ?Ambulatory BPs and CBGs are very good. ?He is not using a CPAP for a while.  Denies snoring or feeling sleepy ? ?Review of Systems ?See above  ? ?Past Medical History:  ?Diagnosis Date  ? DDD (degenerative disc disease), lumbar   ? Diabetes mellitus   ? Hyperlipemia   ? Hypertension   ? Obesity   ? OSA (obstructive sleep apnea)   ? on CPAP  ? ? ?Past Surgical History:  ?Procedure Laterality Date  ? APPLICATION OF ROBOTIC ASSISTANCE FOR SPINAL PROCEDURE N/A 01/11/2021  ? Procedure: APPLICATION OF ROBOTIC ASSISTANCE FOR SPINAL PROCEDURE;  Surgeon: Judith Part, MD;  Location: [facility];  Service: Neurosurgery;  Laterality: N/A;  ? APPLICATION OF WOUND VAC N/A 01/25/2021  ? Procedure: APPLICATION OF WOUND VAC;  Surgeon: Dawley, Theodoro Doing, DO;  Location: [facility];  Service: Neurosurgery;  Laterality: N/A;  ? BACK SURGERY    ? COLONOSCOPY  2019  ? LUMBAR WOUND DEBRIDEMENT N/A 01/25/2021  ? Procedure: Irrigation and Debridement of lumbar wound, and wound vacuum assisted closure;  Surgeon: Dawley, Theodoro Doing, DO;  Location: [facility];  Service: Neurosurgery;  Laterality: N/A;  ? TRANSFORAMINAL LUMBAR INTERBODY FUSION (TLIF) WITH PEDICLE SCREW FIXATION 4 LEVEL N/A 01/11/2021  ? Procedure: Lumbar one-two, Lumbar two-three, Lumbar three-four, Lumbar four-five, Lumbar five Sacral one Open decompression, Transforaminal lumbar interbody fusion, posterolateral instrumented fusion;  Surgeon: Judith Part, MD;  Location: [facility];  Service: Neurosurgery;  Laterality: N/A;  ? ? ?Current Outpatient Medications  ?Medication Instructions  ? ascorbic acid (VITAMIN C) 500 mg, Oral, 2 times daily  ? blood glucose meter kit and supplies Use up to four times daily as directed. Dx: E11.9  ? Blood Glucose Monitoring Suppl  (ONETOUCH VERIO FLEX SYSTEM) w/Device KIT Test blood sugar twice a day.  Dx code: E11.9  ? cefadroxil (DURICEF) 500 mg, Oral, 2 times daily  ? cyclobenzaprine (FLEXERIL) 10 mg, Oral, 3 times daily PRN  ? diazepam (VALIUM) 5 mg, Oral, Daily at bedtime  ? fluticasone (FLONASE) 50 MCG/ACT nasal spray Nasal  ? gabapentin (NEURONTIN) 600 mg, Oral, 3 times daily  ? glucose blood (ONETOUCH VERIO) test strip Check blood sugars before meals and at bedtime.  twice daily  ? HYDROmorphone (DILAUDID) 2 mg, Oral, Every M-W-F, Take 30 minutes prior to Wound VAC change  ? metFORMIN (GLUCOPHAGE) 1,000 mg, Oral, Daily with breakfast  ? morphine (MS CONTIN) 30 mg, Oral, Every 12 hours  ? naloxone (NARCAN) nasal spray 4 mg/0.1 mL Use as directed  ? ondansetron (ZOFRAN) 4 mg, Oral, Every 8 hours PRN, Can cause constipation- monitor-  ? OneTouch Delica Lancets 83T MISC Test blood sugar before meals and at bedtime.  Dx code: E11.9  ? oxyCODONE (ROXICODONE) 15 mg, Oral, Every 8 hours PRN  ? polyethylene glycol (MIRALAX / GLYCOLAX) 17 g, Oral, 2 times daily  ? promethazine (PHENERGAN) 12.5 mg, Oral, Every 6 hours PRN  ? rosuvastatin (CRESTOR) 20 mg, Oral, Daily  ? senna (SENOKOT) 25.8 mg, Oral, 2 times daily  ? simethicone (MYLICON) 80 mg, Oral, 4 times daily PRN  ? tadalafil (CIALIS) 10-20 mg, Oral, Every 48 hours PRN  ? tamsulosin (FLOMAX) 0.8 mg, Oral, Daily  ? tiZANidine (  ZANAFLEX) 4 mg, Oral, 3 times daily  ? Zinc Sulfate 220 mg, Oral, Daily  ? ? ?   ?Objective:  ? Physical Exam ?BP 126/78 (BP Location: Left Arm, Patient Position: Sitting, Cuff Size: Normal)   Pulse (!) 108   Temp 98 ?F (36.7 ?C) (Oral)   Resp 18   Ht '6\' 2"'  (1.88 m)   Wt 259 lb 8 oz (117.7 kg)   SpO2 95%   BMI 33.32 kg/m?  ?General:   ?Well developed, NAD, BMI noted. ?HEENT:  ?Normocephalic . Face symmetric, atraumatic ?Lungs:  ?CTA B ?Normal respiratory effort, no intercostal retractions, no accessory muscle use. ?Heart: RRR,  no murmur.  ?Lower extremities: no  pretibial edema bilaterally  ?Skin: Not pale. Not jaundice ?Neurologic:  ?alert & oriented X3.  ?Speech normal, gait assisted by cane ?Psych: ?Cognition and judgment appear intact.  ?Cooperative with normal attention span and concentration.  ?Behavior appropriate. ?No anxious or depressed appearing.  Seems in good spirits today ? ?   ?Assessment   ? ? Assessment ?DM ?NO neuropathy per NCS 07-2020.  SX related to back DJD ?HTN ?Hyperlipidemia ?MSK: ?-DJD Knees , back (severe per MRI, + radiation B legs) ?-Chronic neck pain ?Morbid obesity ?OSA  (severe per repeated study April 2022), off  CPAP since ~ 02-2021 ?Diastases recti ?  ?PLAN: ?S/p back surgery complicated by infection (Lumbar discitis and osteomyelitis) ?Saw infectious diseases 06/25/2021, antibiotics dose reduced.  Still has a wound VAC in place.  Overall better, graduated from walker to a cane. ?DM: Last A1c very low, metformin decreased to once daily.  Recheck A1c, consider hold metformin. ?Tachycardia: Slightly tachycardic since back surgery, check TSH. ?ED: Request a refill on Cialis, sent. ?OSA: Used to be on a CPAP, stopped using it around November 2022, he felt it was "too strong" for him.  No snoring at the present time, suspect that weight loss may have  take care of the issue.  Reassess periodically. ?RTC 4 months CPX ?  ? ?This visit occurred during the SARS-CoV-2 public health emergency.  Safety protocols were in place, including screening questions prior to the visit, additional usage of staff PPE, and extensive cleaning of exam room while observing appropriate contact time as indicated for disinfecting solutions.  ? ?

## 2021-08-06 NOTE — Patient Instructions (Addendum)
Per our records you are due for your diabetic eye exam. Please contact your eye doctor to schedule an appointment. Please have them send copies of your office visit notes to [name]. Our fax number is [phone]. If you need a referral to an eye doctor please let us know. ? ? ?  ?GO TO THE LAB : Get the blood work   ? ? ?[place], [place] ?Come back for  a physical exam in 4 months  ?

## 2021-08-07 ENCOUNTER — Encounter
Payer: No Typology Code available for payment source | Attending: Physical Medicine and Rehabilitation | Admitting: Physical Medicine and Rehabilitation

## 2021-08-07 ENCOUNTER — Encounter: Payer: Self-pay | Admitting: Physical Medicine and Rehabilitation

## 2021-08-07 VITALS — BP 120/78 | HR 97 | Ht 74.0 in | Wt 257.0 lb

## 2021-08-07 DIAGNOSIS — Z79891 Long term (current) use of opiate analgesic: Secondary | ICD-10-CM

## 2021-08-07 DIAGNOSIS — G894 Chronic pain syndrome: Secondary | ICD-10-CM

## 2021-08-07 DIAGNOSIS — Z5181 Encounter for therapeutic drug level monitoring: Secondary | ICD-10-CM | POA: Diagnosis present

## 2021-08-07 DIAGNOSIS — M4712 Other spondylosis with myelopathy, cervical region: Secondary | ICD-10-CM

## 2021-08-07 DIAGNOSIS — M5106 Intervertebral disc disorders with myelopathy, lumbar region: Secondary | ICD-10-CM | POA: Diagnosis present

## 2021-08-07 NOTE — Assessment & Plan Note (Signed)
S/p back surgery complicated by infection (Lumbar discitis and osteomyelitis) ?Saw infectious diseases 06/25/2021, antibiotics dose reduced.  Still has a wound VAC in place.  Overall better, graduated from walker to a cane. ?DM: Last A1c very low, metformin decreased to once daily.  Recheck A1c, consider hold metformin. ?Tachycardia: Slightly tachycardic since back surgery, check TSH. ?ED: Request a refill on Cialis, sent. ?OSA: Used to be on a CPAP, stopped using it around November 2022, he felt it was "too strong" for him.  No snoring at the present time, suspect that weight loss may have  take care of the issue.  Reassess periodically. ?RTC 4 months CPX ?  ?

## 2021-08-07 NOTE — Patient Instructions (Signed)
Plan: ? ?Patient here for trigger point injections for ? Consent done and on chart. ? ?Cleaned areas with alcohol and injected using a 27 gauge 1.5 inch needle ? ?Injected 4.5cc ?Using 1% Lidocaine with no EPI ? ?Upper traps ?Levators ?Posterior scalenes ?Middle scalenes ?Splenius Capitus ?Pectoralis Major ?Rhomboids ?Infraspinatus ?Teres Major/minor ?Thoracic paraspinals ?Lumbar paraspinals- L lumbar paraspinals x6- to break up very large trP.  ?Other injections-  ? ? ?Patient's level of pain prior was 8/10 ?Current level of pain after injections is- is 5/10 right after ? ?There was no bleeding or complications. ? ?Patient was advised to drink a lot of water on day after injections to flush system ?Will have increased soreness for 12-48 hours after injections.  ?Can use Lidocaine patches the day AFTER injections ?Can use theracane on day of injections in places didn't inject ?Can use heating pad 4-6 hours AFTER injections  ? ?2. F/U 5/3- to do trigger point injections again in case.  ?

## 2021-08-07 NOTE — Progress Notes (Signed)
Pt is a 56 yr old male with lumbar radiculopathy/myelopathy secondary to nerve root compression and bacteremia from Klebsiella pneumonia and large back incision with neurogenic bowel and bladder and choric pain now as a result ?  ?Here for  f/u on new SCI. And for trigger point injections for pain.  ? ?Walking with cane - out of w/c and not even using RW. ? ? ?Didn't drive today - brother drove him or wife usually does.  ? ? ?Still has wound VAC- getting changed M/TH's- says looking real good and closed up a lot-  ?Is now ~ 3 inches long as compared to 10-14 inches long.  ? ?Needs trigger point injections- knot on L side that's really bothering him.  ? ? ?Off IV ABX- on pill PO ABX.  ?Stopped IV ABX in March-  ? ? ?Plan: ? ?Patient here for trigger point injections for ? Consent done and on chart. ? ?Cleaned areas with alcohol and injected using a 27 gauge 1.5 inch needle ? ?Injected 4.5cc ?Using 1% Lidocaine with no EPI ? ?Upper traps ?Levators ?Posterior scalenes ?Middle scalenes ?Splenius Capitus ?Pectoralis Major ?Rhomboids ?Infraspinatus ?Teres Major/minor ?Thoracic paraspinals ?Lumbar paraspinals- L lumbar paraspinals x6- to break up very large trP.  ?Other injections-  ? ? ?Patient's level of pain prior was 8/10 ?Current level of pain after injections is- is 5/10 right after ? ?There was no bleeding or complications. ? ?Patient was advised to drink a lot of water on day after injections to flush system ?Will have increased soreness for 12-48 hours after injections.  ?Can use Lidocaine patches the day AFTER injections ?Can use theracane on day of injections in places didn't inject ?Can use heating pad 4-6 hours AFTER injections  ? ?2. F/U 5/3- to do trigger point injections again in case.  ? ?

## 2021-08-08 ENCOUNTER — Encounter (HOSPITAL_BASED_OUTPATIENT_CLINIC_OR_DEPARTMENT_OTHER): Payer: No Typology Code available for payment source | Admitting: General Surgery

## 2021-08-08 ENCOUNTER — Ambulatory Visit: Payer: No Typology Code available for payment source

## 2021-08-08 DIAGNOSIS — M545 Low back pain, unspecified: Secondary | ICD-10-CM | POA: Diagnosis not present

## 2021-08-08 DIAGNOSIS — G8929 Other chronic pain: Secondary | ICD-10-CM

## 2021-08-08 DIAGNOSIS — L98424 Non-pressure chronic ulcer of back with necrosis of bone: Secondary | ICD-10-CM | POA: Diagnosis not present

## 2021-08-08 DIAGNOSIS — M6281 Muscle weakness (generalized): Secondary | ICD-10-CM

## 2021-08-08 NOTE — Progress Notes (Addendum)
LORY, GALAN ([id number]) ?Visit Report for 08/08/2021 ?Arrival Information Details ?Patient Name: Date of Service: ?[place], [name] 08/08/2021 9:30 A M ?Medical Record Number: [id number] ?Patient Account Number: [id number] ?Date of Birth/Sex: Treating RN: ?April 08, 1966 (56 y.o. [name] ?Primary Care Analycia Khokhar: [name] Other Clinician: ?Referring Teka Chanda: ?Treating Margrette Wynia/Extender: Fredirick Maudlin ?[name], [place] ?Weeks in Treatment: 19 ?Visit Information History Since Last Visit ?Added or deleted any medications: No ?Patient Arrived: [name] ?Any new allergies or adverse reactions: No ?Arrival Time: 09:32 ?Had a fall or experienced change in No ?Transfer Assistance: None ?activities of daily living that may affect ?Patient Identification Verified: Yes ?risk of falls: ?Secondary Verification Process Completed: Yes ?Signs or symptoms of abuse/neglect since last visito No ?Patient Requires Transmission-Based Precautions: No ?Hospitalized since last visit: No ?Patient Has Alerts: Yes ?Implantable device outside of the clinic excluding No ?Patient Alerts: Patient on Blood Thinner ?cellular tissue based products placed in the center ?No BP Right Arm-PICC ?since last visit: ?Has Dressing in Place as Prescribed: Yes ?Pain Present Now: No ?Electronic Signature(s) ?Signed: 08/08/2021 9:33:10 AM By: Lorrin Jackson ?Entered By: Lorrin Jackson on 08/08/2021 09:33:10 ?-------------------------------------------------------------------------------- ?Encounter Discharge Information Details ?Patient Name: Date of Service: ?[name] 08/08/2021 9:30 A M ?Medical Record Number: [id number] ?Patient Account Number: [id number] ?Date of Birth/Sex: Treating RN: ?17-Mar-1966 (56 y.o. [name] ?Primary Care Katilynn Sinkler: [name] Other Clinician: ?Referring Arneta Mahmood: ?Treating Davinity Fanara/Extender: Fredirick Maudlin ?[name], [place] ?Weeks in Treatment: 19 ?Encounter Discharge Information Items ?Discharge Condition: Stable ?Ambulatory Status:  [name] ?Discharge Destination: Home ?Transportation: Private Auto ?Schedule Follow-up Appointment: Yes ?Clinical Summary of Care: Patient Declined ?Electronic Signature(s) ?Signed: 08/08/2021 12:16:38 PM By: Lorrin Jackson ?Entered By: Lorrin Jackson on 08/08/2021 10:05:02 ?-------------------------------------------------------------------------------- ?Negative Pressure Wound Therapy Maintenance (NPWT) Details ?Patient Name: ?Date of Service: ?[place], [name] 08/08/2021 9:30 A M ?Medical Record Number: [id number] ?Patient Account Number: [id number] ?Date of Birth/Sex: ?Treating RN: ?25-Sep-1965 (56 y.o. [name] ?Primary Care Marguita Venning: [name] ?Other Clinician: ?Referring Danie Diehl: ?Treating Khristin Keleher/Extender: Fredirick Maudlin ?[name], [place] ?Weeks in Treatment: 19 ?NPWT Maintenance Performed for: Wound #1 Midline Back ?Performed By: Lorrin Jackson, RN ?Type: VAC System ?Coverage Size (sq cm): 14.25 ?Pressure Type: Constant ?Pressure Setting: 125 mmHG ?Drain Type: None ?Sponge/Dressing Type: ?Combination : ?Date Initiated: 03/28/2021 ?Dressing Removed: Yes ?Canister Changed: No ?Dressing Reapplied: Yes ?Quantity of Sponges/Gauze Inserted: 3 ?Days On NPWT : 134 ?Electronic Signature(s) ?Signed: 08/08/2021 12:16:38 PM By: Lorrin Jackson ?Entered By: Lorrin Jackson on 08/08/2021 10:04:21 ?-------------------------------------------------------------------------------- ?Patient/Caregiver Education Details ?Patient Name: ?Date of Service: ?[place], [name] 4/27/2023andnbsp9:30 A M ?Medical Record Number: [id number] ?Patient Account Number: [id number] ?Date of Birth/Gender: ?Treating RN: ?[birth year] (56 y.o. [name] ?Primary Care Physician: [name] ?Other Clinician: ?Referring Physician: ?Treating Physician/Extender: Fredirick Maudlin ?[name], [place] ?Weeks in Treatment: 19 ?Education Assessment ?Education Provided To: ?Patient ?Education Topics Provided ?Wound/Skin Impairment: ?Methods: Demonstration,  Explain/Verbal ?Responses: State content correctly ?Electronic Signature(s) ?Signed: 08/08/2021 12:16:38 PM By: Lorrin Jackson ?Entered By: Lorrin Jackson on 08/08/2021 10:04:52 ?-------------------------------------------------------------------------------- ?Wound Assessment Details ?Patient Name: ?Date of Service: ?[place], [name] 08/08/2021 9:30 A M ?Medical Record Number: [id number] ?Patient Account Number: [id number] ?Date of Birth/Sex: ?Treating RN: ?[birth year] (56 y.o. [name] ?Primary Care Shaine Newmark: [name] ?Other Clinician: ?Referring Zeplin Aleshire: ?Treating Elbie Statzer/Extender: Fredirick Maudlin ?[name], [place] ?Weeks in Treatment: 19 ?Wound Status ?Wound Number: 1 ?Primary Etiology: Open Surgical Wound ?Wound Location: Midline Back ?Wound Status: Open ?Wounding Event: Surgical Injury ?Comorbid History: Hypertension, Type II Diabetes, Osteomyelitis ?Date  Acquired: 01/11/2021 ?Weeks Of Treatment: 19 ?Clustered Wound: No ?Wound Measurements ?Length: (cm) 9.5 ?Width: (cm) 1.5 ?Depth: (cm) 1.7 ?Area: (cm?) 11.192 ?Volume: (cm?) 19.026 ?% Reduction in Area: 85.2% ?% Reduction in Volume: 89.1% ?Epithelialization: Medium (34-66%) ?Tunneling: Yes ?Position (o'clock): 12 ?Maximum Distance: (cm) 2 ?Undermining: No ?Wound Description ?Classification: Full Thickness With Exposed Support Structures ?Wound Margin: Well defined, not attached ?Exudate Amount: Medium ?Exudate Type: Serosanguineous ?Exudate Color: red, brown ?Foul Odor After Cleansing: No ?Slough/Fibrino No ?Wound Bed ?Granulation Amount: Large (67-100%) Exposed Structure ?Granulation Quality: Red ?Fascia Exposed: No ?Necrotic Amount: None Present (0%) ?Fat Layer (Subcutaneous Tissue) Exposed: Yes ?Tendon Exposed: No ?Muscle Exposed: No ?Joint Exposed: No ?Bone Exposed: No ?Treatment Notes ?Wound #1 (Back) Wound Laterality: Midline ?Cleanser ?Peri-Wound Care ?Topical ?Primary Dressing ?Secondary Dressing ?Secured With ?Compression Wrap ?Compression  Stockings ?Add-Ons ?Electronic Signature(s) ?Signed: 08/08/2021 12:16:38 PM By: Lorrin Jackson ?Entered By: Lorrin Jackson on 08/08/2021 09:39:29 ?-------------------------------------------------------------------------------- ?Vitals Details ?Patient Name: ?Date of Service: ?[place], [name] 08/08/2021 9:30 A M ?Medical Record Number: [id number] ?Patient Account Number: [id number] ?Date of Birth/Sex: ?Treating RN: ?[birth year] (56 y.o. [name] ?Primary Care Jett Kulzer: [name] ?Other Clinician: ?Referring Cambri Plourde: ?Treating Thorne Wirz/Extender: Fredirick Maudlin ?[name], [place] ?Weeks in Treatment: 19 ?Vital Signs ?Time Taken: 09:36 ?Temperature (??F): 97.8 ?Pulse (bpm): 93 ?Respiratory Rate (breaths/min): 16 ?Blood Pressure (mmHg): 134/80 ?Reference Range: 80 - 120 mg / dl ?Electronic Signature(s) ?Signed: 08/08/2021 12:16:38 PM By: Lorrin Jackson ?Entered By: Lorrin Jackson on 08/08/2021 09:36:21 ?

## 2021-08-08 NOTE — Progress Notes (Signed)
[name], [name] ([id number]) ?Visit Report for 08/08/2021 ?SuperBill Details ?Patient Name: Date of Service: ?[name], [name]. 08/08/2021 ?Medical Record Number: [id number] ?Patient Account Number: [id number] ?Date of Birth/Sex: Treating RN: ?10/20/1965 (56 y.o. 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

## 2021-08-08 NOTE — Therapy (Signed)
?OUTPATIENT PHYSICAL THERAPY TREATMENT NOTE ? ? ?Patient Name: [name] ?MRN: [id number] ?DOB:Jan 11, 1966, 56 y.o., male ?Today's Date: 08/08/2021 ? ?PCP: Colon Branch, MD ?REFERRING PROVIDER: Bary Leriche, PA-C ? ? PT End of Session - 08/08/21 1406   ? ? Visit Number 18   ? Number of Visits 25   ? Date for PT Re-Evaluation 08/31/21   ? Authorization Type [facility]   ? PT Start Time 7341   ? PT Stop Time 9379   ? PT Time Calculation (min) 38 min   ? Equipment Utilized During Treatment Other (comment);Gait belt   SPC  ? Activity Tolerance Patient tolerated treatment well   ? Behavior During Therapy [facility] for tasks assessed/performed   ? ?  ?  ? ?  ? ? ? ? ? ? ? ? ? ? ? ? ? ? ? ? ?Past Medical History:  ?Diagnosis Date  ? DDD (degenerative disc disease), lumbar   ? Diabetes mellitus   ? Hyperlipemia   ? Hypertension   ? Obesity   ? OSA (obstructive sleep apnea)   ? on CPAP  ? ?Past Surgical History:  ?Procedure Laterality Date  ? APPLICATION OF ROBOTIC ASSISTANCE FOR SPINAL PROCEDURE N/A 01/11/2021  ? Procedure: APPLICATION OF ROBOTIC ASSISTANCE FOR SPINAL PROCEDURE;  Surgeon: Judith Part, MD;  Location: [place];  Service: Neurosurgery;  Laterality: N/A;  ? APPLICATION OF WOUND VAC N/A 01/25/2021  ? Procedure: APPLICATION OF WOUND VAC;  Surgeon: Dawley, Theodoro Doing, DO;  Location: [place];  Service: Neurosurgery;  Laterality: N/A;  ? BACK SURGERY    ? COLONOSCOPY  2019  ? LUMBAR WOUND DEBRIDEMENT N/A 01/25/2021  ? Procedure: Irrigation and Debridement of lumbar wound, and wound vacuum assisted closure;  Surgeon: Dawley, Theodoro Doing, DO;  Location: [place];  Service: Neurosurgery;  Laterality: N/A;  ? TRANSFORAMINAL LUMBAR INTERBODY FUSION (TLIF) WITH PEDICLE SCREW FIXATION 4 LEVEL N/A 01/11/2021  ? Procedure: Lumbar one-two, Lumbar two-three, Lumbar three-four, Lumbar four-five, Lumbar five Sacral one Open decompression, Transforaminal lumbar interbody fusion, posterolateral instrumented fusion;  Surgeon: Judith Part,  MD;  Location: [place];  Service: Neurosurgery;  Laterality: N/A;  ? ?Patient Active Problem List  ? Diagnosis Date Noted  ? Presence of retained hardware 06/25/2021  ? Hardware complicating wound infection ([place]) 03/13/2021  ? Medication monitoring encounter 03/13/2021  ? Slow transit constipation   ? Disto-occlusion   ? Lumbar discitis   ? Epidural abscess   ? 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 Hypoalbuminemia due to protein-calorie malnutrition ([place])   ? Acute blood loss anemia   ? Other chronic postprocedural pain   ? 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 Klebsiella pneumoniae infection 01/28/2021  ? Wound infection after surgery 01/25/2021  ? Muscle spasms of both lower extremities 01/25/2021  ? Abdominal pain 01/25/2021  ? Spleen enlarged 01/25/2021  ? Lumbar radiculopathy 01/11/2021  ? Spasticity 12/03/2020  ? Neuropathy 09/28/2020  ? Chronic pain syndrome 09/28/2020  ? Intervertebral lumbar disc disorder with myelopathy, lumbar region 09/28/2020  ? Spondylosis, cervical, with myelopathy 09/28/2020  ? Unilateral primary osteoarthritis, right knee 10/28/2019  ? PCP NOTES >>>>>>>> 07/09/2015  ? DJD (degenerative joint disease) 10/27/2014  ? Screening for heart disease 06/28/2012  ? OSA (obstructive sleep apnea) 06/14/2012  ? ED (erectile dysfunction) 04/26/2012  ? Annual physical exam 12/29/2011  ? Achilles tendinitis 09/10/2010  ? Allergic rhinitis 07/12/2010  ? Hyperlipidemia 04/26/2007  ? DM II (diabetes mellitus, type  II), controlled ([place]) 04/22/2006  ? OBESITY NOS 04/22/2006  ? Essential hypertension 04/22/2006  ? ? ?REFERRING DIAG:  ?M51.06 (ICD-10-CM) - Lumbar disc herniation with myelopathy  ? ?THERAPY DIAG:  ?Chronic bilateral low back pain, unspecified whether sciatica present ? ?Muscle weakness (generalized) ? ?PERTINENT HISTORY:  ?s/p L1-S1 decompression and instrumented fusion 01/11/21 ?Inpatient rehab: 01/18/21- 03/08/21 ?Incision and debridement of lumbar wound, placement of wound VAC  01/25/21 ?Wound care visits occurring on Monday and Thursday ? ?PRECAUTIONS: Wound and Falls ? ?SUBJECTIVE:  ?Pt presents to PT with reports of lower back pain and discomfort. Had injections the previous day, notes slight improvement in pain. Has been compliant with HEP with no adverse effect. Pt is ready to begin PT at this time.  ? ?Pain Assessment: ?Are you having pain? Yes ?NPRS scale: 7/10 ?Pain location: low back  ?PAIN TYPE: stabbing, dull  ?Pain description: constant  ?Aggravating factors: prolonged sitting  ?Relieving factors: laying supine  ? ? ?OBJECTIVE:  ? ?LE MMT: ?  ?MMT Right ?06/05/2021 Left ?06/05/2021  ?Hip flexion 3- 3-  ?Hip extension      ?Hip abduction      ?Hip adduction      ?Hip internal rotation      ?Hip external rotation      ?Knee flexion 4 4  ?Knee extension 4+ 4+  ?Ankle dorsiflexion 5 5  ?Ankle plantarflexion 4 4  ?Ankle inversion      ?Ankle eversion      ? (Blank rows = not tested) ?  ?FUNCTIONAL TESTS:  ?            5 x STS: 17 seconds use of BUE  ?           TUG: 25 seconds RW  ?           2 MWT: 284 ft CGA ?  ?  ?TODAY'S TREATMENT  ?[facility] Adult PT Treatment:                                                DATE: 08/08/2021 ?Therapeutic Exercise: ?NuStep lvl 7 UE/LE x 5 min ?Row 3x10 20#  ?Shoulder ext 2x10 20# ?Pallof press 2x10 10# ?STS from elevated table 2x10 - 5# overhead press ?Seated horizontal abd 3x15 ?KB Deadlift 15# 14in 2x10  ?Standing hip abd/ext 2x10 25# ?Seated knee extension 3x15 25# ?Seated hamstring curl 3x15 55# ?Tandem stance 2x30" each ? ?[facility] Adult PT Treatment:                                                DATE: 08/05/2021 ?Therapeutic Exercise: ?NuStep lvl 7 UE/LE x 5 min ?Step ups fwd 2x10 each 8in ?Row 2x10 20#  ?Shoulder ext 2x10 20# ?Pallof press 2x10 10# ?STS from elevated table 2x10 ?Standing hip abd/ext 2x10 25# ?Seated knee extension 3x15 20# ?Seated hamstring curl 3x15 45# ?Neuromuscular Re-Ed: ?Hurdle step overs (6) x 2 fwd/lateral in // ?Amb in // x 4  laps no UE support ? ?[facility] Adult PT Treatment:  DATE: 08/01/2021 ?Therapeutic Exercise: ?NuStep lvl 7 UE/LE x 4 min ?Row 2x10 17#  ?Pallof press 2x10 10# ?STS from elevated table 2x10 ?Standing hip abd/ext 2x10 25# ?Seated knee extension 3x15 20# ?Seated hamstring curl 3x15 45# ?Neuromuscular Re-Ed: ?Hurdle step overs (6) x 2 fwd/lateral in // ?Tandem walk x 2 laps in // ?Amb in // x 4 laps no UE support ?Lateral walk in // x 3 laps no UE support ? ? ?PATIENT EDUCATION:  ?Education details: utilized [facility] for household ambulation ?Person educated: patient ?Education method: instruction ?Education comprehension: verbalized understanding  ?  ?  ?HOME EXERCISE PROGRAM: ?Access Code: [place] ?URL: [email or web address] ?Date: 06/27/2021 ?Prepared by: Octavio Manns ? ?Exercises ?Seated March - 2 x daily - 7 x weekly - 3 sets - 10 reps ?Seated Hip Abduction with Resistance - 2 x daily - 7 x weekly - 3 sets - 10 reps ?Seated Long Arc Quad - 2 x daily - 7 x weekly - 3 sets - 10 reps ?Seated Heel Raise - 2 x daily - 7 x weekly - 3 sets - 10 reps ?Standing Romberg to 1/2 Tandem Stance - 1 x daily - 7 x weekly - 3 sets - 30 sec hold ?Standing Tandem Balance with Counter Support - 1 x daily - 7 x weekly - 2 reps - 30 sec hold ?  ?  ?ASSESSMENT: ?  ?CLINICAL IMPRESSION: ?Pt was again able to complete prescribed exercises with no adverse effect. Therapy continued to progress core and proximal hip strengthening, with pt continuing to improve and show increased strength and activity tolerance. Will continue to progress exercises as able per POC.  ?  ?OBJECTIVE IMPAIRMENTS Abnormal gait, decreased activity tolerance, decreased balance, decreased endurance, decreased mobility, difficulty walking, decreased ROM, decreased strength, impaired flexibility, improper body mechanics, postural dysfunction, obesity, and pain.  ?  ?ACTIVITY LIMITATIONS cleaning, community activity,  driving, meal prep, occupation, laundry, shopping, and yard work.  ?  ?PERSONAL FACTORS Age, Fitness, and Time since onset of injury/illness/exacerbation are also affecting patient's functional outcome.  ?  ?  ?GOALS:

## 2021-08-09 NOTE — Addendum Note (Signed)
Addended byDamita Dunnings D on: 08/09/2021 07:52 AM ? ? Modules accepted: Orders ? ?

## 2021-08-12 ENCOUNTER — Telehealth: Payer: Self-pay | Admitting: *Deleted

## 2021-08-12 ENCOUNTER — Other Ambulatory Visit (HOSPITAL_COMMUNITY): Payer: Self-pay

## 2021-08-12 ENCOUNTER — Encounter (HOSPITAL_BASED_OUTPATIENT_CLINIC_OR_DEPARTMENT_OTHER): Payer: No Typology Code available for payment source | Attending: Internal Medicine | Admitting: Internal Medicine

## 2021-08-12 ENCOUNTER — Other Ambulatory Visit: Payer: Self-pay | Admitting: Physical Medicine and Rehabilitation

## 2021-08-12 ENCOUNTER — Ambulatory Visit: Payer: No Typology Code available for payment source

## 2021-08-12 DIAGNOSIS — L98424 Non-pressure chronic ulcer of back with necrosis of bone: Secondary | ICD-10-CM | POA: Insufficient documentation

## 2021-08-12 DIAGNOSIS — E11622 Type 2 diabetes mellitus with other skin ulcer: Secondary | ICD-10-CM | POA: Diagnosis not present

## 2021-08-12 DIAGNOSIS — M869 Osteomyelitis, unspecified: Secondary | ICD-10-CM | POA: Diagnosis not present

## 2021-08-12 LAB — TOXASSURE SELECT,+ANTIDEPR,UR

## 2021-08-12 MED ORDER — OXYCODONE HCL 15 MG PO TABS
15.0000 mg | ORAL_TABLET | Freq: Three times a day (TID) | ORAL | 0 refills | Status: DC | PRN
  Filled 2021-08-12: qty 90, 30d supply, fill #0

## 2021-08-12 NOTE — Telephone Encounter (Signed)
Urine drug screen for this encounter is consistent for prescribed medication 

## 2021-08-13 ENCOUNTER — Other Ambulatory Visit: Payer: Self-pay | Admitting: Cardiology

## 2021-08-13 ENCOUNTER — Other Ambulatory Visit: Payer: Self-pay | Admitting: Physical Medicine and Rehabilitation

## 2021-08-13 ENCOUNTER — Other Ambulatory Visit (HOSPITAL_COMMUNITY): Payer: Self-pay

## 2021-08-13 DIAGNOSIS — I2510 Atherosclerotic heart disease of native coronary artery without angina pectoris: Secondary | ICD-10-CM

## 2021-08-13 MED ORDER — ROSUVASTATIN CALCIUM 20 MG PO TABS
20.0000 mg | ORAL_TABLET | Freq: Every day | ORAL | 0 refills | Status: DC
  Filled 2021-08-13: qty 90, 90d supply, fill #0

## 2021-08-14 ENCOUNTER — Encounter
Payer: No Typology Code available for payment source | Attending: Physical Medicine and Rehabilitation | Admitting: Physical Medicine and Rehabilitation

## 2021-08-14 ENCOUNTER — Encounter: Payer: Self-pay | Admitting: Physical Medicine and Rehabilitation

## 2021-08-14 ENCOUNTER — Other Ambulatory Visit (HOSPITAL_COMMUNITY): Payer: Self-pay

## 2021-08-14 VITALS — BP 101/68 | HR 92 | Ht 74.0 in | Wt 266.0 lb

## 2021-08-14 DIAGNOSIS — G894 Chronic pain syndrome: Secondary | ICD-10-CM | POA: Diagnosis present

## 2021-08-14 DIAGNOSIS — M7918 Myalgia, other site: Secondary | ICD-10-CM | POA: Insufficient documentation

## 2021-08-14 DIAGNOSIS — Z79891 Long term (current) use of opiate analgesic: Secondary | ICD-10-CM | POA: Insufficient documentation

## 2021-08-14 DIAGNOSIS — Z5181 Encounter for therapeutic drug level monitoring: Secondary | ICD-10-CM | POA: Diagnosis present

## 2021-08-14 NOTE — Progress Notes (Signed)
Pt is a 57 yr old male with lumbar radiculopathy/myelopathy secondary to nerve root compression and bacteremia from Klebsiella pneumonia and large back incision with neurogenic bowel and bladder and choric pain now as a result ?  ?Here for  f/u on new SCI. And for trigger point injections for pain.  ? ?Still having pain/knot in L low back- still painful.  ? ?Got trigger point injections last week- were very helpful- not as bad as was. But creeping back/pain wise.  ? ?Everything else going well-  ?Still using cane-  ?Has wound VAC- changed Q Monday/Thursday.  ? ?Was reading Plastic Surgeon note- will let close "on its own" with VAC- didn't say how long it might take- has tunneling- getting smaller slowly.  ?A pad size of french fry to fill it up.  ? ?Plan: ? ?Patient here for trigger point injections for ? Consent done and on chart. ? ?Cleaned areas with alcohol and injected using a 27 gauge 1.5 inch needle ? ?Injected  6cc ?Using 1% Lidocaine with no EPI ? ?Upper traps ?Levators ?Posterior scalenes ?Middle scalenes ?Splenius Capitus ?Pectoralis Major ?Rhomboids ?Infraspinatus ?Teres Major/minor ?Thoracic paraspinals ?Lumbar paraspinals- L lumbar paraspinal x8 spots- had to pierce the wound VAC dressing (not over wound) x3 to get to trigger points- pt was OK with this plan and got much more results/relief with this technique.  ?Other injections-  ? ? ?Patient's level of pain prior was 8/10 ?Current level of pain after injections is- now 5/10 ? ?There was no bleeding or complications. ? ?Patient was advised to drink a lot of water on day after injections to flush system ?Will have increased soreness for 12-48 hours after injections.  ?Can use Lidocaine patches the day AFTER injections ?Can use theracane on day of injections in places didn't inject ?Can use heating pad 4-6 hours AFTER injections  ? ?2. Hold pressure at least 4 minutes with tennis ball to relax trigger point/maintain relaxation- max 30 minutes in a day.   ? ?3. F/U in 3 months- double appt for SCI;  ? ?4. Con't MS contin and occ Oxycodone.  ?

## 2021-08-14 NOTE — Patient Instructions (Addendum)
Plan: ? ?Patient here for trigger point injections for ? Consent done and on chart. ? ?Cleaned areas with alcohol and injected using a 27 gauge 1.5 inch needle ? ?Injected  6cc ?Using 1% Lidocaine with no EPI ? ?Upper traps ?Levators ?Posterior scalenes ?Middle scalenes ?Splenius Capitus ?Pectoralis Major ?Rhomboids ?Infraspinatus ?Teres Major/minor ?Thoracic paraspinals ?Lumbar paraspinals- L lumbar paraspinal x8 spots- had to pierce the wound VAC dressing (not over wound) x3 to get to trigger points- pt was OK with this plan and got much more results/relief with this technique.  ?Other injections-  ? ? ?Patient's level of pain prior was 8/10 ?Current level of pain after injections is- now 5/10 ? ?There was no bleeding or complications. ? ?Patient was advised to drink a lot of water on day after injections to flush system ?Will have increased soreness for 12-48 hours after injections.  ?Can use Lidocaine patches the day AFTER injections ?Can use theracane on day of injections in places didn't inject ?Can use heating pad 4-6 hours AFTER injections  ? ?2. Hold pressure at least 4 minutes with tennis ball to relax trigger point/maintain relaxation- max 30 minutes in a day.  ? ?3. F/U in 3 months- double appt for SCI;  ? ?4. Con't MS contin and occ Oxycodone.  ?

## 2021-08-15 ENCOUNTER — Ambulatory Visit: Payer: No Typology Code available for payment source | Admitting: Physical Therapy

## 2021-08-15 ENCOUNTER — Encounter (HOSPITAL_BASED_OUTPATIENT_CLINIC_OR_DEPARTMENT_OTHER): Payer: No Typology Code available for payment source | Admitting: Internal Medicine

## 2021-08-15 DIAGNOSIS — L98424 Non-pressure chronic ulcer of back with necrosis of bone: Secondary | ICD-10-CM | POA: Diagnosis not present

## 2021-08-15 NOTE — Progress Notes (Signed)
TODDRICK, SANNA ([id number]) ?Visit Report for 08/15/2021 ?SuperBill Details ?Patient Name: Date of Service: ?[place], [place]. 08/15/2021 ?Medical Record Number: [id number] ?Patient Account Number: [id number] ?Date of Birth/Sex: Treating RN: ?[birth year] (56 y.o. 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

## 2021-08-15 NOTE — Progress Notes (Signed)
[name] ([id number]) ?Visit Report for 08/15/2021 ?Arrival Information Details ?Patient Name: Date of Service: ?[name] 08/15/2021 10:00 A M ?Medical Record Number: [id number] ?Patient Account Number: [id number] ?Date of Birth/Sex: Treating RN: ?[birth year] (56 y.o. [name], [name] ?Primary Care Jamir Rone: [name] Other Clinician: ?Referring Cale Bethard: ?Treating Gwin Eagon/Extender: Kalman Shan ?[name], [place] ?Weeks in Treatment: 20 ?Visit Information History Since Last Visit ?Added or deleted any medications: No ?Patient Arrived: [name] ?Any new allergies or adverse reactions: No ?Arrival Time: 10:26 ?Had a fall or experienced change in No ?Accompanied By: friend ?activities of daily living that may affect ?Transfer Assistance: None ?risk of falls: ?Patient Identification Verified: Yes ?Signs or symptoms of abuse/neglect since last visito No ?Secondary Verification Process Completed: Yes ?Hospitalized since last visit: No ?Patient Requires Transmission-Based Precautions: No ?Implantable device outside of the clinic excluding No ?Patient Has Alerts: Yes ?cellular tissue based products placed in the center ?Patient Alerts: Patient on Blood Thinner since last visit: ?No BP Right Arm-PICC Has Dressing in Place as Prescribed: Yes ?Pain Present Now: Yes ?Electronic Signature(s) ?Signed: 08/15/2021 4:12:14 PM By: [name] RN ?Entered By: [name] on 08/15/2021 10:26:20 ?-------------------------------------------------------------------------------- ?Encounter Discharge Information Details ?Patient Name: Date of Service: ?[name] 08/15/2021 10:00 A M ?Medical Record Number: [id number] ?Patient Account Number: [id number] ?Date of Birth/Sex: Treating RN: ?Aug 15, 1965 (56 y.o. [name], [name] ?Primary Care Shammond Arave: [name] Other Clinician: ?Referring Tynasia Mccaul: ?Treating Johnson Arizola/Extender: Kalman Shan ?[name], [place] ?Weeks in Treatment: 20 ?Encounter Discharge Information Items ?Discharge  Condition: Stable ?Ambulatory Status: [name] ?Discharge Destination: Home ?Transportation: Private Auto ?Accompanied By: friend ?Schedule Follow-up Appointment: Yes ?Clinical Summary of Care: Patient Declined ?Electronic Signature(s) ?Signed: 08/15/2021 4:12:14 PM By: [name] RN ?Entered By: [name] on 08/15/2021 10:35:04 ?-------------------------------------------------------------------------------- ?Negative Pressure Wound Therapy Maintenance (NPWT) Details ?Patient Name: ?Date of Service: ?[name] 08/15/2021 10:00 A M ?Medical Record Number: [id number] ?Patient Account Number: [id number] ?Date of Birth/Sex: ?Treating RN: ?April 17, 1965 (56 y.o. [name], [name] ?Primary Care Eloyce Bultman: [name] ?Other Clinician: ?Referring Danne Scardina: ?Treating Sianni Cloninger/Extender: Kalman Shan ?[name], [employer] ?Weeks in Treatment: 20 ?NPWT Maintenance Performed for: Wound #1 Midline Back ?Performed By: [name], RN ?Type: VAC System ?Coverage Size (sq cm): 13.6 ?Pressure Type: Constant ?Pressure Setting: 125 mmHG ?Drain Type: None ?Primary Contact: Non-Adherent ?Sponge/Dressing Type: ?Combination : 1 white 1 black ?Date Initiated: 03/28/2021 ?Dressing Removed: Yes ?Quantity of Sponges/Gauze Removed: 1 white 1 black ?Canister Changed: No ?Canister Exudate Volume: 25 ?Dressing Reapplied: Yes ?Quantity of Sponges/Gauze Inserted: 1 white 1 black ?Respones T Treatment: ?o tolerates well ?Days On NPWT : 141 ?Electronic Signature(s) ?Signed: 08/15/2021 4:12:14 PM By: [name] RN ?Entered By: [name] on 08/15/2021 10:34:30 ?-------------------------------------------------------------------------------- ?Patient/Caregiver Education Details ?Patient Name: ?Date of Service: ?[name], [name] 5/4/2023andnbsp10:00 A M ?Medical Record Number: [id number] ?Patient Account Number: [id number] ?Date of Birth/Gender: ?Treating RN: ?1965/06/15 (56 y.o. [name], [name] ?Primary Care Physician: [name] ?Other Clinician: ?Referring Physician: ?Treating Physician/Extender: Kalman Shan ?[name], [place] ?Weeks in Treatment: 20 ?Education Assessment ?Education Provided To: ?Patient ?Education Topics Provided ?Wound/Skin Impairment: ?Methods: Explain/Verbal ?Responses: Reinforcements needed, State content correctly ?Electronic Signature(s) ?Signed: 08/15/2021 4:12:14 PM By: [name] RN ?Entered By: [name] on 08/15/2021 10:34:52 ?-------------------------------------------------------------------------------- ?Wound Assessment Details ?Patient Name: ?Date of Service: ?[name] 08/15/2021 10:00 A M ?Medical Record Number: [id number] ?Patient Account Number: [id number] ?Date of Birth/Sex: ?Treating RN: ?Aug 15, 1965 (56 y.o. [name], [name] ?Primary Care Casimir Barcellos: [name] ?Other Clinician: ?Referring Zi Newbury: ?  Treating Shantana Christon/Extender: Kalman Shan ?[name], [place] ?Weeks in Treatment: 20 ?Wound Status ?Wound Number: 1 ?Primary Etiology: Open Surgical Wound ?Wound Location: Midline Back ?Wound Status: Open ?Wounding Event: Surgical Injury ?Date Acquired: 01/11/2021 ?Weeks Of Treatment: 20 ?Clustered Wound: No ?Wound Measurements ?Length: (cm) 8 ?Width: (cm) 1.7 ?Depth: (cm) 1 ?Area: (cm?) 10.681 ?Volume: (cm?) 10.681 ?% Reduction in Area: 85.9% ?% Reduction in Volume: 93.9% ?Wound Description ?Classification: Full Thickness With Exposed Support Structure ?Exudate Amount: Medium ?Exudate Type: Serosanguineous ?Exudate Color: red, brown ?s ?Treatment Notes ?Wound #1 (Back) Wound Laterality: Midline ?Cleanser ?Peri-Wound Care ?Topical ?Primary Dressing ?Secondary Dressing ?Secured With ?Compression Wrap ?Compression Stockings ?Add-Ons ?Electronic Signature(s) ?Signed: 08/15/2021 4:12:14 PM By: [name] RN ?Entered By: [name] on 08/15/2021 10:33:44 ?-------------------------------------------------------------------------------- ?Vitals Details ?Patient Name: ?Date of  Service: ?[place], [place] 08/15/2021 10:00 A M ?Medical Record Number: [id number] ?Patient Account Number: [id number] ?Date of Birth/Sex: ?Treating RN: ?12/19/1965 (56 y.o. [name], [name] ?Primary Care Brailynn Breth: [name] ?Other Clinician: ?Referring [name] Aguayo: ?Treating Renie Stelmach/Extender: Kalman Shan ?[name], [place] ?Weeks in Treatment: 20 ?Vital Signs ?Time Taken: 10:00 ?Temperature (??F): 97.7 ?Pulse (bpm): 74 ?Respiratory Rate (breaths/min): 17 ?Blood Pressure (mmHg): 134/74 ?Reference Range: 80 - 120 mg / dl ?Electronic Signature(s) ?Signed: 08/15/2021 4:12:14 PM By: [name] RN ?Entered By: [name] on 08/15/2021 10:32:26 ?

## 2021-08-16 ENCOUNTER — Encounter (HOSPITAL_BASED_OUTPATIENT_CLINIC_OR_DEPARTMENT_OTHER): Payer: No Typology Code available for payment source | Admitting: Internal Medicine

## 2021-08-18 ENCOUNTER — Other Ambulatory Visit: Payer: Self-pay | Admitting: Internal Medicine

## 2021-08-19 ENCOUNTER — Other Ambulatory Visit (HOSPITAL_COMMUNITY): Payer: Self-pay

## 2021-08-19 ENCOUNTER — Encounter (HOSPITAL_BASED_OUTPATIENT_CLINIC_OR_DEPARTMENT_OTHER): Payer: No Typology Code available for payment source | Admitting: Internal Medicine

## 2021-08-19 ENCOUNTER — Ambulatory Visit: Payer: No Typology Code available for payment source | Attending: Physical Medicine and Rehabilitation

## 2021-08-19 DIAGNOSIS — M545 Low back pain, unspecified: Secondary | ICD-10-CM | POA: Insufficient documentation

## 2021-08-19 DIAGNOSIS — M6281 Muscle weakness (generalized): Secondary | ICD-10-CM | POA: Diagnosis present

## 2021-08-19 DIAGNOSIS — R2689 Other abnormalities of gait and mobility: Secondary | ICD-10-CM | POA: Diagnosis present

## 2021-08-19 DIAGNOSIS — G8929 Other chronic pain: Secondary | ICD-10-CM | POA: Insufficient documentation

## 2021-08-19 DIAGNOSIS — L98424 Non-pressure chronic ulcer of back with necrosis of bone: Secondary | ICD-10-CM | POA: Diagnosis not present

## 2021-08-19 MED ORDER — GABAPENTIN 600 MG PO TABS
600.0000 mg | ORAL_TABLET | Freq: Three times a day (TID) | ORAL | 1 refills | Status: DC
  Filled 2021-08-19: qty 270, 90d supply, fill #0
  Filled 2021-11-25: qty 270, 90d supply, fill #1

## 2021-08-19 NOTE — Therapy (Signed)
?OUTPATIENT PHYSICAL THERAPY TREATMENT NOTE ? ? ?Patient Name: [name] ?MRN: [id number] ?DOB:1965-07-26, 56 y.o., male ?Today's Date: 08/19/2021 ? ?PCP: Colon Branch, MD ?REFERRING PROVIDER: Bary Leriche, PA-C ? ? PT End of Session - 08/19/21 1404   ? ? Visit Number 19   ? Number of Visits 25   ? Date for PT Re-Evaluation 08/31/21   ? Authorization Type [facility]   ? PT Start Time 3785   ? PT Stop Time 8850   ? PT Time Calculation (min) 38 min   ? Equipment Utilized During Treatment Other (comment);Gait belt   SPC  ? Activity Tolerance Patient tolerated treatment well   ? Behavior During Therapy [facility] for tasks assessed/performed   ? ?  ?  ? ?  ? ? ? ? ? ? ? ? ? ? ? ? ? ? ? ? ? ?Past Medical History:  ?Diagnosis Date  ? DDD (degenerative disc disease), lumbar   ? Diabetes mellitus   ? Hyperlipemia   ? Hypertension   ? Obesity   ? OSA (obstructive sleep apnea)   ? on CPAP  ? ?Past Surgical History:  ?Procedure Laterality Date  ? APPLICATION OF ROBOTIC ASSISTANCE FOR SPINAL PROCEDURE N/A 01/11/2021  ? Procedure: APPLICATION OF ROBOTIC ASSISTANCE FOR SPINAL PROCEDURE;  Surgeon: Judith Part, MD;  Location: [place];  Service: Neurosurgery;  Laterality: N/A;  ? APPLICATION OF WOUND VAC N/A 01/25/2021  ? Procedure: APPLICATION OF WOUND VAC;  Surgeon: Dawley, Theodoro Doing, DO;  Location: [place];  Service: Neurosurgery;  Laterality: N/A;  ? BACK SURGERY    ? COLONOSCOPY  2019  ? LUMBAR WOUND DEBRIDEMENT N/A 01/25/2021  ? Procedure: Irrigation and Debridement of lumbar wound, and wound vacuum assisted closure;  Surgeon: Dawley, Theodoro Doing, DO;  Location: [place];  Service: Neurosurgery;  Laterality: N/A;  ? TRANSFORAMINAL LUMBAR INTERBODY FUSION (TLIF) WITH PEDICLE SCREW FIXATION 4 LEVEL N/A 01/11/2021  ? Procedure: Lumbar one-two, Lumbar two-three, Lumbar three-four, Lumbar four-five, Lumbar five Sacral one Open decompression, Transforaminal lumbar interbody fusion, posterolateral instrumented fusion;  Surgeon: Judith Part,  MD;  Location: [place];  Service: Neurosurgery;  Laterality: N/A;  ? ?Patient Active Problem List  ? Diagnosis Date Noted  ? Myofascial pain 08/14/2021  ? Presence of retained hardware 06/25/2021  ? Hardware complicating wound infection ([place]) 03/13/2021  ? Medication monitoring encounter 03/13/2021  ? Slow transit constipation   ? Disto-occlusion   ? Lumbar discitis   ? Epidural abscess   ? 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 Hypoalbuminemia due to protein-calorie malnutrition ([place])   ? Acute blood loss anemia   ? Other chronic postprocedural pain   ? 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 Klebsiella pneumoniae infection 01/28/2021  ? Wound infection after surgery 01/25/2021  ? Muscle spasms of both lower extremities 01/25/2021  ? Abdominal pain 01/25/2021  ? Spleen enlarged 01/25/2021  ? Lumbar radiculopathy 01/11/2021  ? Spasticity 12/03/2020  ? Neuropathy 09/28/2020  ? Chronic pain syndrome 09/28/2020  ? Intervertebral lumbar disc disorder with myelopathy, lumbar region 09/28/2020  ? Spondylosis, cervical, with myelopathy 09/28/2020  ? Unilateral primary osteoarthritis, right knee 10/28/2019  ? PCP NOTES >>>>>>>> 07/09/2015  ? DJD (degenerative joint disease) 10/27/2014  ? Screening for heart disease 06/28/2012  ? OSA (obstructive sleep apnea) 06/14/2012  ? ED (erectile dysfunction) 04/26/2012  ? Annual physical exam 12/29/2011  ? Achilles tendinitis 09/10/2010  ? Allergic rhinitis 07/12/2010  ? Hyperlipidemia 04/26/2007  ?  DM II (diabetes mellitus, type II), controlled ([employer]) 04/22/2006  ? OBESITY NOS 04/22/2006  ? Essential hypertension 04/22/2006  ? ? ?REFERRING DIAG:  ?M51.06 (ICD-10-CM) - Lumbar disc herniation with myelopathy  ? ?THERAPY DIAG:  ?Chronic bilateral low back pain, unspecified whether sciatica present ? ?Muscle weakness (generalized) ? ?PERTINENT HISTORY:  ?s/p L1-S1 decompression and instrumented fusion 01/11/21 ?Inpatient rehab: 01/18/21- 03/08/21 ?Incision and debridement of lumbar wound,  placement of wound VAC 01/25/21 ?Wound care visits occurring on Monday and Thursday ? ?PRECAUTIONS: Wound and Falls ? ?SUBJECTIVE:  ?Pt presents to PT with reports of decreased lower back pain and discomfort. He had injections last week and notes that it decreased pain. Pt is ready to begin PT at this time.  ? ?Pain Assessment: ?Are you having pain? Yes ?NPRS scale: 7/10 ?Pain location: low back  ?PAIN TYPE: stabbing, dull  ?Pain description: constant  ?Aggravating factors: prolonged sitting  ?Relieving factors: laying supine  ? ? ?OBJECTIVE:  ? ?LE MMT: ?  ?MMT Right ?06/05/2021 Left ?06/05/2021  ?Hip flexion 3- 3-  ?Hip extension      ?Hip abduction      ?Hip adduction      ?Hip internal rotation      ?Hip external rotation      ?Knee flexion 4 4  ?Knee extension 4+ 4+  ?Ankle dorsiflexion 5 5  ?Ankle plantarflexion 4 4  ?Ankle inversion      ?Ankle eversion      ? (Blank rows = not tested) ?  ?FUNCTIONAL TESTS:  ?            5 x STS: 17 seconds use of BUE  ?           TUG: 25 seconds RW  ?           2 MWT: 284 ft CGA ?  ?  ?TODAY'S TREATMENT  ?[facility] Adult PT Treatment:                                                DATE: 08/19/2021 ?Therapeutic Exercise: ?NuStep lvl 7 UE/LE x 5 min ?Row 3x10 23#  ?Shoulder ext 3x10 23# ?Pallof press 2x10 10# ?STS from elevated table 2x10 - 15# KB ?Seated horizontal abd 3x15 black TB ?KB Deadlift 15# 14in 2x10  ?Standing hip abd/ext 2x10 42.5# ?Seated knee extension 3x15 25# ?Seated hamstring curl 3x15 55# ?Tandem stance 2x30" each ? ?[facility] Adult PT Treatment:                                                DATE: 08/08/2021 ?Therapeutic Exercise: ?NuStep lvl 7 UE/LE x 5 min ?Row 3x10 20#  ?Shoulder ext 2x10 20# ?Pallof press 2x10 10# ?STS from elevated table 2x10 - 5# overhead press ?Seated horizontal abd 3x15 ?KB Deadlift 15# 14in 2x10  ?Standing hip abd/ext 2x10 25# ?Seated knee extension 3x15 25# ?Seated hamstring curl 3x15 55# ?Tandem stance 2x30" each ? ?[facility] Adult PT Treatment:  DATE: 08/05/2021 ?Therapeutic Exercise: ?NuStep lvl 7 UE/LE x 5 min ?Step ups fwd 2x10 each 8in ?Row 2x10 20#  ?Shoulder ext 2x10 20# ?Pallof press 2x10 10# ?STS from elevated table 2x10 ?Standing hip abd/ext 2x10 25# ?Seated knee extension 3x15 20# ?Seated hamstring curl 3x15 45# ?Neuromuscular Re-Ed: ?Hurdle step overs (6) x 2 fwd/lateral in // ?Amb in // x 4 laps no UE support ? ?[facility] Adult PT Treatment:                                                DATE: 08/01/2021 ?Therapeutic Exercise: ?NuStep lvl 7 UE/LE x 4 min ?Row 2x10 17#  ?Pallof press 2x10 10# ?STS from elevated table 2x10 ?Standing hip abd/ext 2x10 25# ?Seated knee extension 3x15 20# ?Seated hamstring curl 3x15 45# ?Neuromuscular Re-Ed: ?Hurdle step overs (6) x 2 fwd/lateral in // ?Tandem walk x 2 laps in // ?Amb in // x 4 laps no UE support ?Lateral walk in // x 3 laps no UE support ? ? ?PATIENT EDUCATION:  ?Education details: utilized [facility] for household ambulation ?Person educated: patient ?Education method: instruction ?Education comprehension: verbalized understanding  ?  ?  ?HOME EXERCISE PROGRAM: ?Access Code: [place] ?URL: [email or web address] ?Date: 06/27/2021 ?Prepared by: Octavio Manns ? ?Exercises ?Seated March - 2 x daily - 7 x weekly - 3 sets - 10 reps ?Seated Hip Abduction with Resistance - 2 x daily - 7 x weekly - 3 sets - 10 reps ?Seated Long Arc Quad - 2 x daily - 7 x weekly - 3 sets - 10 reps ?Seated Heel Raise - 2 x daily - 7 x weekly - 3 sets - 10 reps ?Standing Romberg to 1/2 Tandem Stance - 1 x daily - 7 x weekly - 3 sets - 30 sec hold ?Standing Tandem Balance with Counter Support - 1 x daily - 7 x weekly - 2 reps - 30 sec hold ?  ?  ?ASSESSMENT: ?  ?CLINICAL IMPRESSION: ?Pt was able to complete all prescribed exercises with no adverse effect or increase in pain. Therapy continued to focused on improving core and LE strength in order to improve functional ability and decrease  pain. Will continue to progress as able per POC.  ?  ?OBJECTIVE IMPAIRMENTS Abnormal gait, decreased activity tolerance, decreased balance, decreased endurance, decreased mobility, difficulty walking, decr

## 2021-08-19 NOTE — Telephone Encounter (Signed)
Please check with the patient, what amount of gabapentin is he taking?  Has he been taking 600 mg 3 times daily for a while ?

## 2021-08-19 NOTE — Telephone Encounter (Signed)
Refill request for gabapentin '600mg'$  tid, I do not see where you have refilled for quite some time, please advise?  ?

## 2021-08-20 NOTE — Progress Notes (Addendum)
VASILIOS, OTTAWAY ([id number]) ?Visit Report for 08/12/2021 ?Arrival Information Details ?Patient Name: Date of Service: ?[place], [name] 08/12/2021 10:45 A M ?Medical Record Number: [id number] ?Patient Account Number: [id number] ?Date of Birth/Sex: Treating RN: ?11-Dec-1965 (56 y.o. [name] ?Primary Care Cutberto Winfree: [name] Other Clinician: ?Referring Kadeen Sroka: ?Treating Kijuan Gallicchio/Extender: Kalman Shan ?[name], [place] ?Weeks in Treatment: 19 ?Visit Information History Since Last Visit ?Added or deleted any medications: No ?Patient Arrived: [name] ?Any new allergies or adverse reactions: No ?Arrival Time: 10:55 ?Had a fall or experienced change in No ?Accompanied By: brother ?activities of daily living that may affect ?Transfer Assistance: None ?risk of falls: ?Patient Identification Verified: Yes ?Signs or symptoms of abuse/neglect since last visito No ?Secondary Verification Process Completed: Yes ?Hospitalized since last visit: No ?Patient Requires Transmission-Based Precautions: No ?Implantable device outside of the clinic excluding No ?Patient Has Alerts: Yes ?cellular tissue based products placed in the center ?Patient Alerts: Patient on Blood Thinner since last visit: ?No BP Right Arm-PICC Has Dressing in Place as Prescribed: Yes ?Pain Present Now: Yes ?Electronic Signature(s) ?Signed: 08/20/2021 8:25:23 AM By: Sharyn Creamer RN, BSN ?Entered By: Sharyn Creamer on 08/12/2021 10:56:52 ?-------------------------------------------------------------------------------- ?Complex / Palliative Patient Assessment Details ?Patient Name: Date of Service: ?[name] 08/12/2021 10:45 A M ?Medical Record Number: [id number] ?Patient Account Number: [id number] ?Date of Birth/Sex: Treating RN: ?Jul 07, 1965 (56 y.o. M) [name], [name] ?Primary Care Manar Smalling: [name] Other Clinician: ?Referring Yoshiko Keleher: ?Treating Johnell Landowski/Extender: Kalman Shan ?[name], [place] ?Weeks in Treatment: 19 ?Complex Wound Management Criteria ?Patient has  remarkable or complex co-morbidities requiring medications or treatments that extend wound healing times. Examples: ?Diabetes mellitus with chronic renal failure or end stage renal disease requiring dialysis ?Advanced or poorly controlled rheumatoid arthritis ?Diabetes mellitus and end stage chronic obstructive pulmonary disease ?Active cancer with current chemo- or radiation therapy ?HTN, DDD, history of osteomyelitis, diabetes type II ?Palliative Wound Management Criteria ?Care Approach ?Wound Care Plan: Complex Wound Management ?Electronic Signature(s) ?Signed: 08/23/2021 2:02:32 PM By: Deon Pilling RN, BSN ?Signed: 08/28/2021 4:38:19 PM By: Kalman Shan DO ?Entered By: Deon Pilling on 08/23/2021 14:02:32 ?-------------------------------------------------------------------------------- ?Encounter Discharge Information Details ?Patient Name: ?Date of Service: ?[place], [name] 08/12/2021 10:45 A M ?Medical Record Number: [id number] ?Patient Account Number: [id number] ?Date of Birth/Sex: ?Treating RN: ?04/10/1966 (56 y.o. [name] ?Primary Care Windsor Goeken: [name] ?Other Clinician: ?Referring Javaeh Muscatello: ?Treating Elfrieda Espino/Extender: Kalman Shan ?[name], [place] ?Weeks in Treatment: 19 ?Encounter Discharge Information Items ?Discharge Condition: Stable ?Ambulatory Status: [name] ?Discharge Destination: Home ?Transportation: Private Auto ?Accompanied By: brother ?Schedule Follow-up Appointment: Yes ?Clinical Summary of Care: Patient Declined ?Electronic Signature(s) ?Signed: 08/20/2021 8:25:23 AM By: Sharyn Creamer RN, BSN ?Entered By: Sharyn Creamer on 08/12/2021 12:51:19 ?-------------------------------------------------------------------------------- ?Lower Extremity Assessment Details ?Patient Name: ?Date of Service: ?[place], [name] 08/12/2021 10:45 A M ?Medical Record Number: [id number] ?Patient Account Number: [id number] ?Date of Birth/Sex: ?Treating RN: ?[birth year] (56 y.o. [name] ?Primary Care  Davinity Fanara: [name] ?Other Clinician: ?Referring Arletha Marschke: ?Treating Demoni Parmar/Extender: Kalman Shan ?[name], [place] ?Weeks in Treatment: 19 ?Electronic Signature(s) ?Signed: 08/20/2021 8:25:23 AM By: Sharyn Creamer RN, BSN ?Entered By: Sharyn Creamer on 08/12/2021 10:57:44 ?-------------------------------------------------------------------------------- ?Multi Wound Chart Details ?Patient Name: ?Date of Service: ?[place], [name] 08/12/2021 10:45 A M ?Medical Record Number: [id number] ?Patient Account Number: [id number] ?Date of Birth/Sex: ?Treating RN: ?1965/07/15 (56 y.o. [name], [name] ?Primary Care Vernetta Dizdarevic: [name] ?Other Clinician: ?Referring Ashonte Angelucci: ?Treating Nyasiah Moffet/Extender: Kalman Shan ?[name], [place] ?Weeks in Treatment: 19 ?Vital Signs ?Height(in): ?Pulse(bpm):  80 ?Weight(lbs): ?Blood Pressure(mmHg): 147/88 ?Body Mass Index(BMI): ?Temperature(??F): 97.7 ?Respiratory Rate(breaths/min): 18 ?Photos: [N/A:N/A] ?Midline Back N/A N/A ?Wound Location: ?Surgical Injury N/A N/A ?Wounding Event: ?Open Surgical Wound N/A N/A ?Primary Etiology: ?Hypertension, Type II Diabetes, N/A N/A ?Comorbid History: ?Osteomyelitis ?01/11/2021 N/A N/A ?Date Acquired: ?47 N/A N/A ?Weeks of Treatment: ?Open N/A N/A ?Wound Status: ?No N/A N/A ?Wound Recurrence: ?8x1.7x1 N/A N/A ?Measurements L x W x D (cm) ?10.681 N/A N/A ?A (cm?) : ?rea ?10.681 N/A N/A ?Volume (cm?) : ?85.90% N/A N/A ?% Reduction in A rea: ?93.90% N/A N/A ?% Reduction in Volume: ?12 ?Position 1 (o'clock): ?2 ?Maximum Distance 1 (cm): ?Yes N/A N/A ?Tunneling: ?Full Thickness With Exposed Support N/A N/A ?Classification: ?Structures ?Medium N/A N/A ?Exudate Amount: ?Serosanguineous N/A N/A ?Exudate Type: ?red, brown N/A N/A ?Exudate Color: ?Well defined, not attached N/A N/A ?Wound Margin: ?Large (67-100%) N/A N/A ?Granulation Amount: ?Red, Hyper-granulation N/A N/A ?Granulation Quality: ?None Present (0%) N/A N/A ?Necrotic Amount: ?Fat Layer (Subcutaneous Tissue):  Yes N/A N/A ?Exposed Structures: ?Fascia: No ?Tendon: No ?Muscle: No ?Joint: No ?Bone: No ?Large (67-100%) N/A N/A ?Epithelialization: ?Treatment Notes ?Electronic Signature(s) ?Signed: 08/12/2021 11:34:58 AM By: Kalman Shan DO ?Signed: 08/15/2021 4:12:14 PM By: [name] RN ?Entered By: Kalman Shan on 08/12/2021 11:30:36 ?-------------------------------------------------------------------------------- ?Multi-Disciplinary Care Plan Details ?Patient Name: ?Date of Service: ?[place], [name] 08/12/2021 10:45 A M ?Medical Record Number: [id number] ?Patient Account Number: [id number] ?Date of Birth/Sex: ?Treating RN: ?05-Jul-1965 (56 y.o. [name] ?Primary Care Stanton Kissoon: [name] ?Other Clinician: ?Referring Illias Pantano: ?Treating Cardin Nitschke/Extender: Kalman Shan ?[name], [place] ?Weeks in Treatment: 19 ?Multidisciplinary Care Plan reviewed with physician ?Active Inactive ?Wound/Skin Impairment ?Nursing Diagnoses: ?Impaired tissue integrity ?Goals: ?Patient/caregiver will verbalize understanding of skin care regimen ?Date Initiated: 03/28/2021 ?Target Resolution Date: 09/09/2021 ?Goal Status: Active ?Ulcer/skin breakdown will have a volume reduction of 30% by week 4 ?Date Initiated: 03/28/2021 ?Date Inactivated: 05/27/2021 ?Target Resolution Date: 05/31/2021 ?Goal Status: Met ?Interventions: ?Assess patient/caregiver ability to obtain necessary supplies ?Assess patient/caregiver ability to perform ulcer/skin care regimen upon admission and as needed ?Assess ulceration(s) every visit ?Provide education on ulcer and skin care ?Treatment Activities: ?Topical wound management initiated : 03/28/2021 ?Notes: ?Electronic Signature(s) ?Signed: 08/20/2021 8:25:23 AM By: Sharyn Creamer RN, BSN ?Entered By: Sharyn Creamer on 08/12/2021 11:19:42 ?-------------------------------------------------------------------------------- ?Negative Pressure Wound Therapy Maintenance (NPWT) Details ?Patient Name: ?Date of Service: ?[name] 08/12/2021 10:45 A M ?Medical Record Number: [id number] ?Patient Account Number: [id number] ?Date of Birth/Sex: ?Treating RN: ?06-20-1965 (56 y.o. [name] ?Primary Care Shenica Holzheimer: [name] ?Other

## 2021-08-20 NOTE — Progress Notes (Signed)
JALEN, DALUZ ([id number]) ?Visit Report for 08/12/2021 ?Chief Complaint Document Details ?Patient Name: Date of Service: ?[name], [name] 08/12/2021 10:45 A M ?Medical Record Number: [id number] ?Patient Account Number: [id number] ?Date of Birth/Sex: Treating RN: ?1965-09-03 (56 y.o. [name], [name] ?Primary Care Provider: [name] Other Clinician: ?Referring Provider: ?Treating Provider/Extender: Kalman Shan ?[name], [name] ?Weeks in Treatment: 19 ?Information Obtained from: Patient ?Chief Complaint ?Back wound ?Electronic Signature(s) ?Signed: 08/12/2021 11:34:58 AM By: Kalman Shan DO ?Entered By: Kalman Shan on 08/12/2021 11:30:44 ?-------------------------------------------------------------------------------- ?HPI Details ?Patient Name: Date of Service: ?[name], [name] 08/12/2021 10:45 A M ?Medical Record Number: [id number] ?Patient Account Number: [id number] ?Date of Birth/Sex: Treating RN: ?[birth year] (56 y.o. [name], [name] ?Primary Care Provider: [name] Other Clinician: ?Referring Provider: ?Treating Provider/Extender: Kalman Shan ?[name], [name] ?Weeks in Treatment: 19 ?History of Present Illness ?HPI Description: Admission 03/28/2021 ?[name] is a 56 year old male with a past medical history of controlled type 2 diabetes on oral agents, obesity and OSA that presents to the clinic for a ?back wound. On 01/11/2021 patient had a laminectomy with PLIF of L1-S1 by Dr. Venetia Constable because of lumbar stenosis and radiculopathy. He subsequently ?developed bacteremia. He had CT imaging on 10/13 of the lumbar spine that showed fluid collection in the soft tissue of the posterior L1 and S1 and was taken ?to the OR for washout on 10/14. 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 He received 4 weeks of IV antibiotics by infectious ?disease. After his completion of 4 weeks of IV antibiotics he was continued for an additional 4 weeks of IV cefazolin with a stop date of 12/29.  He has been ?evaluated by plastic surgery and no plans for surgical intervention at this time. Wife is present and reports he has been on the wound VAC for the past 8 ?weeks with improvement in wound healing. He currently denies systemic signs of infection. ?12/22; patient presents for follow-up. He reports no issues since last clinic visit. He denies signs of infection. He has been tolerating the wound VAC well. ?12/30; patient presents for follow-up. He reports no issues and has no complaints today. He has been tolerating the wound VAC well. ?1/9; patient presents for follow-up. He has no issues or complaints today. He states he feels well. He has had no problems with the wound VAC. ?1/16; patient presents for follow-up. He continues to use the wound VAC with no issues. He denies signs of infection. ?1/23; patient presents for follow-up. He has been switched from IV cefazolin to oral cefadroxil by infectious disease. He has no issues or complaints today. He ?denies signs of infection. He continues to tolerate the wound VAC well. ?1/30; patient presents for follow-up. He continues to tolerate the wound VAC well. ?2/6; patient presents for follow-up. He has no issues or complaints today. He continues to tolerate the wound VAC well. He denies signs of infection. ?2/13; patient presents for follow-up. He continues to do well with the wound VAC. He denies any issues. ?2/27; patient presents for follow-up. He continues to use the wound VAC without any issues. He denies signs of infection. ?3/20; patient presents for follow-up. He has no issues or complaints today. He continues to use the wound VAC. ?4/3; patient presents for follow-up. He continues to use the wound VAC without issues. He denies signs of infection. ?4/17; 2-week follow-up. He continues to do well. His measurements are improved. Initially a surgical wound complicated by infection. ?5/1; patient  presents for follow-up. He has no issues or complaints today.  He continues to tolerate the wound VAC well. He denies signs of infection. ?Electronic Signature(s) ?Signed: 08/12/2021 11:34:58 AM By: Kalman Shan DO ?Entered By: Kalman Shan on 08/12/2021 11:31:10 ?-------------------------------------------------------------------------------- ?Physical Exam Details ?Patient Name: Date of Service: ?[name], [name] 08/12/2021 10:45 A M ?Medical Record Number: [id number] ?Patient Account Number: [id number] ?Date of Birth/Sex: Treating RN: ?04/18/1965 (56 y.o. [name], [name] ?Primary Care Provider: [name] Other Clinician: ?Referring Provider: ?Treating Provider/Extender: Kalman Shan ?[name], [name] ?Weeks in Treatment: 19 ?Constitutional ?respirations regular, non-labored and within target range for patient.Marland Kitchen ?Psychiatric ?pleasant and cooperative. ?Notes ?Lumbar spine: T the previous incision site there is an open wound with granulation tissue throughout and tunneling at the 12 o'clock position. No signs of ?o ?surrounding infection. Appears well-healing. ?Electronic Signature(s) ?Signed: 08/12/2021 11:34:58 AM By: Kalman Shan DO ?Entered By: Kalman Shan on 08/12/2021 11:31:50 ?-------------------------------------------------------------------------------- ?Physician Orders Details ?Patient Name: Date of Service: ?[name], [name] 08/12/2021 10:45 A M ?Medical Record Number: [id number] ?Patient Account Number: [id number] ?Date of Birth/Sex: Treating RN: ?[birth year] (56 y.o. [name] ?Primary Care Provider: [name] Other Clinician: ?Referring Provider: ?Treating Provider/Extender: Kalman Shan ?[name], [place] ?Weeks in Treatment: 19 ?Verbal / Phone Orders: No ?Diagnosis Coding ?Follow-up Appointments ?ppointment in 2 weeks. - w/ Dr. Heber [place] and Allayne Butcher Room # 9 08/26/21 ?Return A ?Nurse Visit: - Thursday 08/15/21, Monday 5/8, Thursday 5/11 ?Bathing/ Shower/ Hygiene ?May shower with protection but do not get wound dressing(s) wet. ?Negative Presssure Wound  Therapy ?Wound Vac to wound continuously at 118m/hg pressure ?Black Foam - wound base then bridge to right side ?White Foam - T tunnel at 12:00 ?o ?Additional Orders / Instructions ?Follow Nutritious Diet - Continue to monitor blood sugars daily ?Wound Treatment ?Electronic Signature(s) ?Signed: 08/12/2021 11:34:58 AM By: HKalman ShanDO ?Entered By: HKalman Shanon 08/12/2021 11:31:57 ?-------------------------------------------------------------------------------- ?Problem List Details ?Patient Name: ?Date of Service: ?[place], [name] 08/12/2021 10:45 A M ?Medical Record Number: [id number]?Patient Account Number: [id number]?Date of Birth/Sex: ?Treating RN: ?3Nov 06, 1967((56y.o. 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 08/12/2021 10:45 A M ?Medical Record Number: [id number]?Patient Account Number: [id number]?Date of Birth/Sex: ?Treating RN: ?3Apr 17, 1967((56y.o. MBurnadette Pop [name] ?Primary Care Provider: PKathlene November?Other Clinician: ?Referring Provider: ?Treating Provider/Extender: HKalman Shan?[place]ose [place]?Weeks in Treatment: 19 ?Subjective ?Chief Complaint ?Information obtained from Patient ?Back wound ?History of Present Illness (HPI) ?Admission 03/28/2021 ?Mr. CKyrell Murphy is a 56year old male with a past medical history of controlled type 2 diabetes on oral agents, obesity and OSA that presents to the clinic for a ?back wound. On 01/11/2021 patient had a laminectomy with PLI

## 2021-08-22 ENCOUNTER — Encounter (HOSPITAL_BASED_OUTPATIENT_CLINIC_OR_DEPARTMENT_OTHER): Payer: No Typology Code available for payment source | Admitting: Internal Medicine

## 2021-08-22 ENCOUNTER — Ambulatory Visit: Payer: No Typology Code available for payment source

## 2021-08-22 DIAGNOSIS — M545 Low back pain, unspecified: Secondary | ICD-10-CM | POA: Diagnosis not present

## 2021-08-22 DIAGNOSIS — L98424 Non-pressure chronic ulcer of back with necrosis of bone: Secondary | ICD-10-CM | POA: Diagnosis not present

## 2021-08-22 DIAGNOSIS — M6281 Muscle weakness (generalized): Secondary | ICD-10-CM

## 2021-08-22 NOTE — Therapy (Signed)
?OUTPATIENT PHYSICAL THERAPY TREATMENT NOTE ? ? ?Patient Name: [name] ?MRN: [id number] ?DOB:11/19/1965, 56 y.o., male ?Today's Date: 08/22/2021 ? ?PCP: Colon Branch, MD ?REFERRING PROVIDER: Bary Leriche, PA-C ? ? PT End of Session - 08/22/21 1402   ? ? Visit Number 20   ? Number of Visits 25   ? Date for PT Re-Evaluation 08/31/21   ? Authorization Type [facility]   ? PT Start Time 1402   ? PT Stop Time 1440   ? PT Time Calculation (min) 38 min   ? Equipment Utilized During Treatment Other (comment);Gait belt   SPC  ? Activity Tolerance Patient tolerated treatment well   ? Behavior During Therapy [facility] for tasks assessed/performed   ? ?  ?  ? ?  ? ? ? ? ? ? ? ? ? ? ? ? ? ? ? ? ? ? ?Past Medical History:  ?Diagnosis Date  ? DDD (degenerative disc disease), lumbar   ? Diabetes mellitus   ? Hyperlipemia   ? Hypertension   ? Obesity   ? OSA (obstructive sleep apnea)   ? on CPAP  ? ?Past Surgical History:  ?Procedure Laterality Date  ? APPLICATION OF ROBOTIC ASSISTANCE FOR SPINAL PROCEDURE N/A 01/11/2021  ? Procedure: APPLICATION OF ROBOTIC ASSISTANCE FOR SPINAL PROCEDURE;  Surgeon: Judith Part, MD;  Location: [place];  Service: Neurosurgery;  Laterality: N/A;  ? APPLICATION OF WOUND VAC N/A 01/25/2021  ? Procedure: APPLICATION OF WOUND VAC;  Surgeon: Dawley, Theodoro Doing, DO;  Location: [place];  Service: Neurosurgery;  Laterality: N/A;  ? BACK SURGERY    ? COLONOSCOPY  2019  ? LUMBAR WOUND DEBRIDEMENT N/A 01/25/2021  ? Procedure: Irrigation and Debridement of lumbar wound, and wound vacuum assisted closure;  Surgeon: Dawley, Theodoro Doing, DO;  Location: [facility];  Service: Neurosurgery;  Laterality: N/A;  ? TRANSFORAMINAL LUMBAR INTERBODY FUSION (TLIF) WITH PEDICLE SCREW FIXATION 4 LEVEL N/A 01/11/2021  ? Procedure: Lumbar one-two, Lumbar two-three, Lumbar three-four, Lumbar four-five, Lumbar five Sacral one Open decompression, Transforaminal lumbar interbody fusion, posterolateral instrumented fusion;  Surgeon: Judith Part, MD;  Location: [place];  Service: Neurosurgery;  Laterality: N/A;  ? ?Patient Active Problem List  ? Diagnosis Date Noted  ? Myofascial pain 08/14/2021  ? Presence of retained hardware 06/25/2021  ? Hardware complicating wound infection ([place]) 03/13/2021  ? Medication monitoring encounter 03/13/2021  ? Slow transit constipation   ? Disto-occlusion   ? Lumbar discitis   ? Epidural abscess   ? 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 Hypoalbuminemia due to protein-calorie malnutrition ([place])   ? Acute blood loss anemia   ? Other chronic postprocedural pain   ? 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 Klebsiella pneumoniae infection 01/28/2021  ? Wound infection after surgery 01/25/2021  ? Muscle spasms of both lower extremities 01/25/2021  ? Abdominal pain 01/25/2021  ? Spleen enlarged 01/25/2021  ? Lumbar radiculopathy 01/11/2021  ? Spasticity 12/03/2020  ? Neuropathy 09/28/2020  ? Chronic pain syndrome 09/28/2020  ? Intervertebral lumbar disc disorder with myelopathy, lumbar region 09/28/2020  ? Spondylosis, cervical, with myelopathy 09/28/2020  ? Unilateral primary osteoarthritis, right knee 10/28/2019  ? PCP NOTES >>>>>>>> 07/09/2015  ? DJD (degenerative joint disease) 10/27/2014  ? Screening for heart disease 06/28/2012  ? OSA (obstructive sleep apnea) 06/14/2012  ? ED (erectile dysfunction) 04/26/2012  ? Annual physical exam 12/29/2011  ? Achilles tendinitis 09/10/2010  ? Allergic rhinitis 07/12/2010  ? Hyperlipidemia 04/26/2007  ?  DM II (diabetes mellitus, type II), controlled ([place]) 04/22/2006  ? OBESITY NOS 04/22/2006  ? Essential hypertension 04/22/2006  ? ? ?REFERRING DIAG:  ?M51.06 (ICD-10-CM) - Lumbar disc herniation with myelopathy  ? ?THERAPY DIAG:  ?Chronic bilateral low back pain, unspecified whether sciatica present ? ?Muscle weakness (generalized) ? ?PERTINENT HISTORY:  ?s/p L1-S1 decompression and instrumented fusion 01/11/21 ?Inpatient rehab: 01/18/21- 03/08/21 ?Incision and debridement of lumbar wound,  placement of wound VAC 01/25/21 ?Wound care visits occurring on Monday and Thursday ? ?PRECAUTIONS: Wound and Falls ? ?SUBJECTIVE:  ?Pt presents to PT with reports of continued lower back pain. Has remained compliant with his HEP with no adverse effect. Pt is ready to begin PT at this time.  ? ?Pain Assessment: ?Are you having pain? Yes ?NPRS scale: 6/10 ?Pain location: low back  ?PAIN TYPE: stabbing, dull  ?Pain description: constant  ?Aggravating factors: prolonged sitting  ?Relieving factors: laying supine  ? ? ?OBJECTIVE:  ? ?LE MMT: ?  ?MMT Right ?06/05/2021 Left ?06/05/2021  ?Hip flexion 3- 3-  ?Hip extension      ?Hip abduction      ?Hip adduction      ?Hip internal rotation      ?Hip external rotation      ?Knee flexion 4 4  ?Knee extension 4+ 4+  ?Ankle dorsiflexion 5 5  ?Ankle plantarflexion 4 4  ?Ankle inversion      ?Ankle eversion      ? (Blank rows = not tested) ?  ?FUNCTIONAL TESTS:  ?            5 x STS: 17 seconds use of BUE  ?           TUG: 25 seconds RW  ?           2 MWT: 284 ft CGA ?  ?  ?TODAY'S TREATMENT  ?[facility] Adult PT Treatment:                                                DATE: 08/22/2021 ?Therapeutic Exercise: ?NuStep lvl 7 UE/LE x 4 min ?Row 3x10 27#  ?Shoulder ext 3x10 23# ?Pallof press 2x10 10# ?Standing chop 2x10 10# each ?STS from elevated table 2x10 - 15# KB ?Seated horizontal abd 3x15 black TB ?KB Deadlift 15# 10in 3x10  ?Standing hip abd/ext 3x10 50# ?Seated knee extension 3x15 25# ?Seated hamstring curl 3x15 65# ?Sled push/pull 4x56f 30# ? [facility]                                                [facility]: 08/[facility]/2021 ?Therapeutic Exercise: ?NuStep lvl 7 UE/LE x 5 min ?Row 3x10 23#  ?Shoulder ext 3x10 23# ?Pallof press 2x10 10# ?STS from elevated table 2x10 - 15# KB ?Seated horizontal abd 3x15 black TB ?KB Deadlift 15# 14in 2x10  ?Standing hip abd/ext 2x10 42.5# ?Seated knee extension 3x15 25# ?Seated hamstring curl 3x15 55# ?Tandem stance 2x30" each ? ?[facility]  [facility]:  DATE: 08/08/2021 ?Therapeutic Exercise: ?NuStep lvl 7 UE/LE x 5 min ?Row 3x10 20#  ?Shoulder ext 2x10 20# ?Pallof press 2x10 10# ?STS from elevated table 2x10 - 5# overhead press ?Seated horizontal abd 3x15 ?KB Deadlift 15# 14in 2x10  ?Standing hip abd/ext 2x10 25# ?Seated knee extension 3x15 25# ?Seated hamstring curl 3x15 55# ?Tandem stance 2x30" each ? ?PATIENT EDUCATION:  ?Education details: utilized [facility] for household ambulation ?Person educated: patient ?Education method: instruction ?Education comprehension: verbalized understanding  ?  ?  ?HOME EXERCISE PROGRAM: ?Access Code: [place] ?URL: [email or web address] ?Date: 06/27/2021 ?Prepared by: Octavio Manns ? ?Exercises ?Seated March - 2 x daily - 7 x weekly - 3 sets - 10 reps ?Seated Hip Abduction with Resistance - 2 x daily - 7 x weekly - 3 sets - 10 reps ?Seated Long Arc Quad - 2 x daily - 7 x weekly - 3 sets - 10 reps ?Seated Heel Raise - 2 x daily - 7 x weekly - 3 sets - 10 reps ?Standing Romberg to 1/2 Tandem Stance - 1 x daily - 7 x weekly - 3 sets - 30 sec hold ?Standing Tandem Balance with Counter Support - 1 x daily - 7 x weekly - 2 reps - 30 sec hold ?  ?  ?ASSESSMENT: ?  ?CLINICAL IMPRESSION: ?Pt was able to complete all prescribed exercises with no adverse effect. Therapy continued to progress core and proximal hip strength today with pt continuing to respond very well to PT treatment. Will continue with current POC as prescribed.  ?  ?OBJECTIVE IMPAIRMENTS Abnormal gait, decreased activity tolerance, decreased balance, decreased endurance, decreased mobility, difficulty walking, decreased ROM, decreased strength, impaired flexibility, improper body mechanics, postural dysfunction, obesity, and pain.  ?  ?ACTIVITY LIMITATIONS cleaning, community activity, driving, meal prep, occupation, laundry, shopping, and yard work.  ?  ?PERSONAL FACTORS Age, Fitness, and Time since onset  of injury/illness/exacerbation are also affecting patient's functional outcome.  ?  ?  ?GOALS: ?Goals reviewed with patient? No ?  ?SHORT TERM GOALS: ?  ?STG Name Target Date Goal status  ?1 Patient will be i

## 2021-08-23 NOTE — Progress Notes (Signed)
MURVIN, GIFT ([id number]) ?Visit Report for 08/22/2021 ?SuperBill Details ?Patient Name: Date of Service: ?[name], [name]. 08/22/2021 ?Medical Record Number: [id number] ?Patient Account Number: [id number] ?Date of Birth/Sex: Treating RN: ?01-16-1966 (56 y.o. 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

## 2021-08-23 NOTE — Progress Notes (Signed)
DAIMIEN, PATMON ([id number]) ?Visit Report for 08/22/2021 ?Arrival Information Details ?Patient Name: Date of Service: ?[place], [name] 08/22/2021 10:00 A M ?Medical Record Number: [id number] ?Patient Account Number: [id number] ?Date of Birth/Sex: Treating RN: ?02/27/1966 (56 y.o. [name], [name] ?Primary Care Lamaya Hyneman: [name] Other Clinician: ?Referring Keidy Thurgood: ?Treating Sharlyn Odonnel/Extender: Kalman Shan ?[name], [place] ?Weeks in Treatment: 21 ?Visit Information History Since Last Visit ?Added or deleted any medications: No ?Patient Arrived: Ambulatory ?Any new allergies or adverse reactions: No ?Arrival Time: 10:50 ?Had a fall or experienced change in No ?Accompanied By: friend ?activities of daily living that may affect ?Transfer Assistance: None ?risk of falls: ?Patient Identification Verified: Yes ?Signs or symptoms of abuse/neglect since last visito No ?Secondary Verification Process Completed: Yes ?Hospitalized since last visit: No ?Patient Requires Transmission-Based Precautions: No ?Implantable device outside of the clinic excluding No ?Patient Has Alerts: Yes ?cellular tissue based products placed in the center ?Patient Alerts: Patient on Blood Thinner since last visit: ?No BP Right Arm-PICC Has Dressing in Place as Prescribed: Yes ?Pain Present Now: Yes ?Electronic Signature(s) ?Signed: 08/23/2021 2:51:54 PM By: [name] RN ?Entered By: [name] on 08/22/2021 10:55:13 ?-------------------------------------------------------------------------------- ?Encounter Discharge Information Details ?Patient Name: Date of Service: ?[place], [name] 08/22/2021 10:00 A M ?Medical Record Number: [id number] ?Patient Account Number: [id number] ?Date of Birth/Sex: Treating RN: ?06-05-1965 (56 y.o. [name], [name] ?Primary Care Skyeler Scalese: [name] Other Clinician: ?Referring Derriona Branscom: ?Treating Tauren Delbuono/Extender: Kalman Shan ?[name], [place] ?Weeks in Treatment: 21 ?Encounter Discharge Information  Items ?Discharge Condition: Stable ?Ambulatory Status: [name] ?Discharge Destination: Home ?Transportation: Private Auto ?Accompanied By: friend ?Schedule Follow-up Appointment: Yes ?Clinical Summary of Care: Patient Declined ?Electronic Signature(s) ?Signed: 08/23/2021 2:51:54 PM By: [name] RN ?Entered By: [name] on 08/22/2021 11:16:35 ?-------------------------------------------------------------------------------- ?Negative Pressure Wound Therapy Maintenance (NPWT) Details ?Patient Name: ?Date of Service: ?[place]ames, [name] 08/22/2021 10:00 A M ?Medical Record Number: [id number] ?Patient Account Number: [id number] ?Date of Birth/Sex: ?Treating RN: ?04-08-1966 (56 y.o. [name], [name] ?Primary Care Cristiana Yochim: [name] ?Other Clinician: ?Referring Leland Raver: ?Treating Fergie Sherbert/Extender: Kalman Shan ?[name], [place] ?Weeks in Treatment: 21 ?NPWT Maintenance Performed for: Wound #1 Midline Back ?Performed By: [name], RN ?Type: VAC System ?Coverage Size (sq cm): 13.6 ?Pressure Type: Constant ?Pressure Setting: 125 mmHG ?Drain Type: None ?Primary Contact: Non-Adherent ?Sponge/Dressing Type: ?Combination : 1 white 1 black ?Date Initiated: 03/28/2021 ?Dressing Removed: Yes ?Quantity of Sponges/Gauze Removed: 1 white 1 black ?Canister Changed: No ?Canister Exudate Volume: 50 ?Dressing Reapplied: Yes ?Quantity of Sponges/Gauze Inserted: 1 white 1 black ?Respones T Treatment: ?o tolerates well ?Days On NPWT : 148 ?Electronic Signature(s) ?Signed: 08/23/2021 2:51:54 PM By: [name] RN ?Entered By: [name] on 08/22/2021 11:00:18 ?-------------------------------------------------------------------------------- ?Patient/Caregiver Education Details ?Patient Name: ?Date of Service: ?Pippen, [name] 5/11/2023andnbsp10:00 A M ?Medical Record Number: [id number] ?Patient Account Number: [id number] ?Date of Birth/Gender: ?Treating RN: ?Nov 11, 1965 (56 y.o. [name], [name] ?Primary Care  Physician: [name] ?Other Clinician: ?Referring Physician: ?Treating Physician/Extender: Kalman Shan ?[name], [place] ?Weeks in Treatment: 21 ?Education Assessment ?Education Provided To: ?Patient ?Education Topics Provided ?Wound/Skin Impairment: ?Methods: Explain/Verbal ?Responses: State content correctly ?Electronic Signature(s) ?Signed: 08/23/2021 2:51:54 PM By: [name] RN ?Entered By: [name] on 08/22/2021 11:01:23 ?-------------------------------------------------------------------------------- ?Wound Assessment Details ?Patient Name: ?Date of Service: ?Saldivar, [name] 08/22/2021 10:00 A M ?Medical Record Number: [id number] ?Patient Account Number: [id number] ?Date of Birth/Sex: ?Treating RN: ?Jan 09, 1966 (56 y.o. [name], [name] ?Primary Care Kyarra Vancamp: [name] ?Other Clinician: ?Referring Amalya Salmons: ?Treating Jeffre Enriques/Extender:  Kalman Shan ?[name], [place] ?Weeks in Treatment: 21 ?Wound Status ?Wound Number: 1 ?Primary Etiology: Open Surgical Wound ?Wound Location: Midline Back ?Wound Status: Open ?Wounding Event: Surgical Injury ?Date Acquired: 01/11/2021 ?Weeks Of Treatment: 21 ?Clustered Wound: No ?Wound Measurements ?Length: (cm) 8 ?Width: (cm) 1.7 ?Depth: (cm) 1 ?Area: (cm?) 10.681 ?Volume: (cm?) 10.681 ?% Reduction in Area: 85.9% ?% Reduction in Volume: 93.9% ?Wound Description ?Classification: Full Thickness With Exposed Support Structure ?Exudate Amount: Medium ?Exudate Type: Serosanguineous ?Exudate Color: red, brown ?s ?Treatment Notes ?Wound #1 (Back) Wound Laterality: Midline ?Cleanser ?Peri-Wound Care ?Topical ?Primary Dressing ?Secondary Dressing ?Secured With ?Compression Wrap ?Compression Stockings ?Add-Ons ?Electronic Signature(s) ?Signed: 08/23/2021 2:51:54 PM By: [name] RN ?Entered By: [name] on 08/22/2021 10:55:57 ?-------------------------------------------------------------------------------- ?Vitals Details ?Patient Name: ?Date of Service: ?Adachi,  [name] 08/22/2021 10:00 A M ?Medical Record Number: [id number] ?Patient Account Number: [id number] ?Date of Birth/Sex: ?Treating RN: ?1966-01-31 (56 y.o. [name], [name] ?Primary Care Tomoya Ringwald: [name] ?Other Clinician: ?Referring Jackston Oaxaca: ?Treating Boleslaw Borghi/Extender: Kalman Shan ?[name], [place] ?Weeks in Treatment: 21 ?Vital Signs ?Time Taken: 10:12 ?Temperature (??F): 97.7 ?Pulse (bpm): 74 ?Respiratory Rate (breaths/min): 17 ?Blood Pressure (mmHg): 111/74 ?Reference Range: 80 - 120 mg / dl ?Electronic Signature(s) ?Signed: 08/23/2021 2:51:54 PM By: [name] RN ?Entered By: [name] on 08/22/2021 10:55:36 ?

## 2021-08-26 ENCOUNTER — Other Ambulatory Visit (HOSPITAL_COMMUNITY): Payer: Self-pay

## 2021-08-26 ENCOUNTER — Other Ambulatory Visit: Payer: Self-pay | Admitting: Physical Medicine and Rehabilitation

## 2021-08-26 ENCOUNTER — Ambulatory Visit: Payer: No Typology Code available for payment source

## 2021-08-26 ENCOUNTER — Encounter (HOSPITAL_BASED_OUTPATIENT_CLINIC_OR_DEPARTMENT_OTHER): Payer: No Typology Code available for payment source | Admitting: Internal Medicine

## 2021-08-26 DIAGNOSIS — E11622 Type 2 diabetes mellitus with other skin ulcer: Secondary | ICD-10-CM

## 2021-08-26 DIAGNOSIS — M869 Osteomyelitis, unspecified: Secondary | ICD-10-CM | POA: Diagnosis not present

## 2021-08-26 DIAGNOSIS — L98424 Non-pressure chronic ulcer of back with necrosis of bone: Secondary | ICD-10-CM | POA: Diagnosis not present

## 2021-08-26 DIAGNOSIS — M545 Low back pain, unspecified: Secondary | ICD-10-CM | POA: Diagnosis not present

## 2021-08-26 DIAGNOSIS — R2689 Other abnormalities of gait and mobility: Secondary | ICD-10-CM

## 2021-08-26 DIAGNOSIS — M6281 Muscle weakness (generalized): Secondary | ICD-10-CM

## 2021-08-26 NOTE — Therapy (Signed)
?OUTPATIENT PHYSICAL THERAPY TREATMENT NOTE ? ? ?Patient Name: [name] ?MRN: [id number] ?DOB:1965/07/04, 56 y.o., male ?Today's Date: 08/26/2021 ? ?PCP: Colon Branch, MD ?REFERRING PROVIDER: Bary Leriche, PA-C ? ? PT End of Session - 08/26/21 1357   ? ? Visit Number 21   ? Number of Visits 25   ? Date for PT Re-Evaluation 08/31/21   ? Authorization Type [facility]   ? PT Start Time 1400   ? PT Stop Time 1438   ? PT Time Calculation (min) 38 min   ? Equipment Utilized During Treatment Other (comment);Gait belt   SPC  ? Activity Tolerance Patient tolerated treatment well   ? Behavior During Therapy [facility] for tasks assessed/performed   ? ?  ?  ? ?  ? ? ? ? ? ? ? ? ? ? ? ? ? ? ? ? ? ? ? ?Past Medical History:  ?Diagnosis Date  ? DDD (degenerative disc disease), lumbar   ? Diabetes mellitus   ? Hyperlipemia   ? Hypertension   ? Obesity   ? OSA (obstructive sleep apnea)   ? on CPAP  ? ?Past Surgical History:  ?Procedure Laterality Date  ? APPLICATION OF ROBOTIC ASSISTANCE FOR SPINAL PROCEDURE N/A 01/11/2021  ? Procedure: APPLICATION OF ROBOTIC ASSISTANCE FOR SPINAL PROCEDURE;  Surgeon: Judith Part, MD;  Location: [place];  Service: Neurosurgery;  Laterality: N/A;  ? APPLICATION OF WOUND VAC N/A 01/25/2021  ? Procedure: APPLICATION OF WOUND VAC;  Surgeon: Dawley, Theodoro Doing, DO;  Location: [place];  Service: Neurosurgery;  Laterality: N/A;  ? BACK SURGERY    ? COLONOSCOPY  2019  ? LUMBAR WOUND DEBRIDEMENT N/A 01/25/2021  ? Procedure: Irrigation and Debridement of lumbar wound, and wound vacuum assisted closure;  Surgeon: Dawley, Theodoro Doing, DO;  Location: [place];  Service: Neurosurgery;  Laterality: N/A;  ? TRANSFORAMINAL LUMBAR INTERBODY FUSION (TLIF) WITH PEDICLE SCREW FIXATION 4 LEVEL N/A 01/11/2021  ? Procedure: Lumbar one-two, Lumbar two-three, Lumbar three-four, Lumbar four-five, Lumbar five Sacral one Open decompression, Transforaminal lumbar interbody fusion, posterolateral instrumented fusion;  Surgeon: Judith Part, MD;  Location: [place];  Service: Neurosurgery;  Laterality: N/A;  ? ?Patient Active Problem List  ? Diagnosis Date Noted  ? Myofascial pain 08/14/2021  ? Presence of retained hardware 06/25/2021  ? Hardware complicating wound infection ([place]) 03/13/2021  ? Medication monitoring encounter 03/13/2021  ? Slow transit constipation   ? Disto-occlusion   ? Lumbar discitis   ? Epidural abscess   ? 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 Hypoalbuminemia due to protein-calorie malnutrition ([place])   ? Acute blood loss anemia   ? Other chronic postprocedural pain   ? 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 Klebsiella pneumoniae infection 01/28/2021  ? Wound infection after surgery 01/25/2021  ? Muscle spasms of both lower extremities 01/25/2021  ? Abdominal pain 01/25/2021  ? Spleen enlarged 01/25/2021  ? Lumbar radiculopathy 01/11/2021  ? Spasticity 12/03/2020  ? Neuropathy 09/28/2020  ? Chronic pain syndrome 09/28/2020  ? Intervertebral lumbar disc disorder with myelopathy, lumbar region 09/28/2020  ? Spondylosis, cervical, with myelopathy 09/28/2020  ? Unilateral primary osteoarthritis, right knee 10/28/2019  ? PCP NOTES >>>>>>>> 07/09/2015  ? DJD (degenerative joint disease) 10/27/2014  ? Screening for heart disease 06/28/2012  ? OSA (obstructive sleep apnea) 06/14/2012  ? ED (erectile dysfunction) 04/26/2012  ? Annual physical exam 12/29/2011  ? Achilles tendinitis 09/10/2010  ? Allergic rhinitis 07/12/2010  ? Hyperlipidemia  04/26/2007  ? DM II (diabetes mellitus, type II), controlled ([place]) 04/22/2006  ? OBESITY NOS 04/22/2006  ? Essential hypertension 04/22/2006  ? ? ?REFERRING DIAG:  ?M51.06 (ICD-10-CM) - Lumbar disc herniation with myelopathy  ? ?THERAPY DIAG:  ?Chronic bilateral low back pain, unspecified whether sciatica present ? ?Muscle weakness (generalized) ? ?Other abnormalities of gait and mobility ? ?PERTINENT HISTORY:  ?s/p L1-S1 decompression and instrumented fusion 01/11/21 ?Inpatient rehab: 01/18/21-  03/08/21 ?Incision and debridement of lumbar wound, placement of wound VAC 01/25/21 ?Wound care visits occurring on Monday and Thursday ? ?PRECAUTIONS: Wound and Falls ? ?SUBJECTIVE:  ?Pt presents to PT with continued reports of lower back pain and discomfort. He notes that he had a good bit of soreness after last session, but overall is doing well. Pt is ready to begin PT at this time.  ? ?Pain Assessment: ?Are you having pain? Yes ?NPRS scale: 6/10 ?Pain location: low back  ?PAIN TYPE: stabbing, dull  ?Pain description: constant  ?Aggravating factors: prolonged sitting  ?Relieving factors: laying supine  ? ? ?OBJECTIVE:  ? ?LE MMT: ?  ?MMT Right ?06/05/2021 Left ?06/05/2021  ?Hip flexion 3- 3-  ?Hip extension      ?Hip abduction      ?Hip adduction      ?Hip internal rotation      ?Hip external rotation      ?Knee flexion 4 4  ?Knee extension 4+ 4+  ?Ankle dorsiflexion 5 5  ?Ankle plantarflexion 4 4  ?Ankle inversion      ?Ankle eversion      ? (Blank rows = not tested) ?  ?FUNCTIONAL TESTS:  ?            5 x STS: 17 seconds use of BUE  ?           TUG: 25 seconds RW  ?           2 MWT: 284 ft CGA ?  ?  ?TODAY'S TREATMENT  ?[facility] Adult PT Treatment:                                                DATE: 08/26/2021 ?Therapeutic Exercise: ?NuStep lvl 7 UE/LE x 5 min ?Row 3x10 23#  ?Shoulder ext 3x10 23# ?Pallof press 2x10 13# ?Standing chop 2x10 13# each ?STS from elevated table 2x10 - 15# KB ?KB Deadlift 25# 10in 3x10  ?Standing hip abd/ext 3x10 50# ?Seated knee extension 3x15 25# ?Seated hamstring curl 3x15 65# ?Sled push/pull 4x79f 30# ? ?[facility]                                                [facility]: 08[facility]22/[facility] ?Therapeutic Exercise: ?NuStep lvl 7 UE/LE x 4 min ?Row 3x10 27#  ?Shoulder ext 3x10 23# ?Pallof press 2x10 10# ?Standing chop 2x10 10# each ?STS from elevated table 2x10 - 15# KB ?Seated horizontal abd 3x15 black TB ?KB Deadlift 15# 10in 3x10  ?Standing hip abd/ext 3x10 50# ?Seated knee extension  3x15 25# ?Seated hamstring curl 3x15 65# ?Sled push/pull 4x291f30# ? ?[facility]  DATE: 08[facility]2021 ?Therapeutic Exercise: ?NuStep lvl 7 UE/LE x 5 min ?Row 3x10 23#  ?Shoulder ext 3x10 23# ?Pallof press 2x10 10# ?STS from elevated table 2x10 - 15# KB ?Seated horizontal abd 3x15 black TB ?KB Deadlift 15# 14in 2x10  ?Standing hip abd/ext 2x10 42.5# ?Seated knee extension 3x15 25# ?Seated hamstring curl 3x15 55# ?Tandem stance 2x30" each ? ?PATIENT EDUCATION:  ?Education details: utilized [facility] for household ambulation ?Person educated: patient ?Education method: instruction ?Education comprehension: verbalized understanding  ?  ?  ?HOME EXERCISE PROGRAM: ?Access Code: [place] ?URL: [email or web address] ?Date: 06/27/2021 ?Prepared by: Octavio Manns ? ?Exercises ?Seated March - 2 x daily - 7 x weekly - 3 sets - 10 reps ?Seated Hip Abduction with Resistance - 2 x daily - 7 x weekly - 3 sets - 10 reps ?Seated Long Arc Quad - 2 x daily - 7 x weekly - 3 sets - 10 reps ?Seated Heel Raise - 2 x daily - 7 x weekly - 3 sets - 10 reps ?Standing Romberg to 1/2 Tandem Stance - 1 x daily - 7 x weekly - 3 sets - 30 sec hold ?Standing Tandem Balance with Counter Support - 1 x daily - 7 x weekly - 2 reps - 30 sec hold ?  ?  ?ASSESSMENT: ?  ?CLINICAL IMPRESSION: ?Pt was again able to complete all prescribed exercises with no adverse effect or increase in pain. Therapy focused on improving core and proximal hip muscle strength in order to decrease pain and improve function. He continues to progress very well with therapy, showing improved strength, gait, and general mobility. PT will assess goals at next session and determine next step in POC as appropriate.  ?  ?OBJECTIVE IMPAIRMENTS Abnormal gait, decreased activity tolerance, decreased balance, decreased endurance, decreased mobility, difficulty walking, decreased ROM, decreased strength, impaired flexibility,  improper body mechanics, postural dysfunction, obesity, and pain.  ?  ?ACTIVITY LIMITATIONS cleaning, community activity, driving, meal prep, occupation, laundry, shopping, and yard work.  ?  ?PERSONAL FACTORS A

## 2021-08-27 NOTE — Progress Notes (Signed)
Mira, Sanjit E. ([id number]) ?Visit Report for 08/26/2021 ?Arrival Information Details ?Patient Name: Date of Service: ?[place], [name] 08/26/2021 10:00 A M ?Medical Record Number: [id number] ?Patient Account Number: [id number] ?Date of Birth/Sex: Treating RN: ?02/24/1966 (56 y.o. M) Palmer, Carrie ?Primary Care Provider: Paz, Jose Other Clinician: ?Referring Provider: ?Treating Provider/Extender: Hoffman, Jessica ?Paz, Jose ?Weeks in Treatment: 21 ?Visit Information History Since Last Visit ?Added or deleted any medications: No ?Patient Arrived: Ambulatory ?Any new allergies or adverse reactions: No ?Arrival Time: 10:08 ?Had a fall or experienced change in No ?Accompanied By: family member ?activities of daily living that may affect ?Transfer Assistance: None ?risk of falls: ?Patient Identification Verified: Yes ?Signs or symptoms of abuse/neglect since last visito No ?Secondary Verification Process Completed: Yes ?Hospitalized since last visit: No ?Patient Requires Transmission-Based Precautions: No ?Implantable device outside of the clinic excluding No ?Patient Has Alerts: Yes ?cellular tissue based products placed in the center ?Patient Alerts: Patient on Blood Thinner since last visit: ?No BP Right Arm-PICC Has Dressing in Place as Prescribed: Yes ?Pain Present Now: Yes ?Electronic Signature(s) ?Signed: 08/27/2021 4:04:33 PM By: Palmer, Carrie RN, BSN ?Entered By: Palmer, Carrie on 08/26/2021 10:08:46 ?-------------------------------------------------------------------------------- ?Encounter Discharge Information Details ?Patient Name: Date of Service: ?[place], [name] 08/26/2021 10:00 A M ?Medical Record Number: [id number] ?Patient Account Number: [id number] ?Date of Birth/Sex: Treating RN: ?10/25/1965 (56 y.o. M) Palmer, Carrie ?Primary Care Provider: Paz, Jose Other Clinician: ?Referring Provider: ?Treating Provider/Extender: Hoffman, Jessica ?Paz, Jose ?Weeks in Treatment: 21 ?Encounter Discharge Information  Items ?Discharge Condition: Stable ?Ambulatory Status: Ambulatory ?Discharge Destination: Home ?Transportation: Private Auto ?Accompanied By: family member ?Schedule Follow-up Appointment: Yes ?Clinical Summary of Care: Patient Declined ?Electronic Signature(s) ?Signed: 08/27/2021 4:04:33 PM By: Palmer, Carrie RN, BSN ?Entered By: Palmer, Carrie on 08/26/2021 11:31:59 ?-------------------------------------------------------------------------------- ?Lower Extremity Assessment Details ?Patient Name: ?Date of Service: ?[place], [name] 08/26/2021 10:00 A M ?Medical Record Number: [id number] ?Patient Account Number: [id number] ?Date of Birth/Sex: ?Treating RN: ?08/21/1965 (56 y.o. M) Palmer, Carrie ?Primary Care Provider: Paz, Jose ?Other Clinician: ?Referring Provider: ?Treating Provider/Extender: Hoffman, Jessica ?Paz, Jose ?Weeks in Treatment: 21 ?Electronic Signature(s) ?Signed: 08/27/2021 4:04:33 PM By: Palmer, Carrie RN, BSN ?Entered By: Palmer, Carrie on 08/26/2021 10:09:50 ?-------------------------------------------------------------------------------- ?Multi Wound Chart Details ?Patient Name: ?Date of Service: ?[place]z, [name] 08/26/2021 10:00 A M ?Medical Record Number: [id number] ?Patient Account Number: [id number] ?Date of Birth/Sex: ?Treating RN: ?07/31/1965 (56 y.o. M) Breedlove, [name] ?Primary Care Provider: Paz, Jose ?Other Clinician: ?Referring Provider: ?Treating Provider/Extender: Hoffman, Jessica ?Paz, Jose ?Weeks in Treatment: 21 ?Vital Signs ?Height(in): ?Pulse(bpm): 98 ?Weight(lbs): ?Blood Pressure(mmHg): 153/88 ?Body Mass Index(BMI): ?Temperature(??F): 97.7 ?Respiratory Rate(breaths/min): 18 ?Photos: [N/A:N/A] ?Midline Back N/A N/A ?Wound Location: ?Surgical Injury N/A N/A ?Wounding Event: ?Open Surgical Wound N/A N/A ?Primary Etiology: ?Hypertension, Type II Diabetes, N/A N/A ?Comorbid History: ?Osteomyelitis ?01/11/2021 N/A N/A ?Date Acquired: ?21 N/A N/A ?Weeks of Treatment: ?Open N/A N/A ?Wound  Status: ?No N/A N/A ?Wound Recurrence: ?8x2.2x1 N/A N/A ?Measurements L x W x D (cm) ?13.823 N/A N/A ?A (cm?) : ?rea ?13.823 N/A N/A ?Volume (cm?) : ?81.70% N/A N/A ?% Reduction in A rea: ?92.00% N/A N/A ?% Reduction in Volume: ?12 ?Position 1 (o'clock): ?1.7 ?Maximum Distance 1 (cm): ?Yes N/A N/A ?Tunneling: ?Full Thickness With Exposed Support N/A N/A ?Classification: ?Structures ?Medium N/A N/A ?Exudate Amount: ?Serosanguineous N/A N/A ?Exudate Type: ?red, brown N/A N/A ?Exudate Color: ?Flat and Intact N/A N/A ?Wound Margin: ?Large (67-100%) N/A N/A ?Granulation Amount: ?Red, Friable N/A N/A ?Granulation   Quality: ?Fat Layer (Subcutaneous Tissue): Yes N/A N/A ?Exposed Structures: ?Fascia: No ?Tendon: No ?Muscle: No ?Joint: No ?Bone: No ?Large (67-100%) N/A N/A ?Epithelialization: ?Treatment Notes ?Electronic Signature(s) ?Signed: 08/26/2021 10:52:43 AM By: Kalman Shan DO ?Signed: 08/27/2021 4:47:11 PM By: [name] RN ?Entered By: Kalman Shan on 08/26/2021 10:49:02 ?-------------------------------------------------------------------------------- ?Multi-Disciplinary Care Plan Details ?Patient Name: ?Date of Service: ?[place], [name] 08/26/2021 10:00 A M ?Medical Record Number: [id number] ?Patient Account Number: [id number] ?Date of Birth/Sex: ?Treating RN: ?1965-09-28 (56 y.o. [name] ?Primary Care Stephanieann Popescu: [name] ?Other Clinician: ?Referring Tobi Leinweber: ?Treating Iana Buzan/Extender: Kalman Shan ?[name], [place] ?Weeks in Treatment: 21 ?Multidisciplinary Care Plan reviewed with physician ?Active Inactive ?Wound/Skin Impairment ?Nursing Diagnoses: ?Impaired tissue integrity ?Goals: ?Patient/caregiver will verbalize understanding of skin care regimen ?Date Initiated: 03/28/2021 ?Target Resolution Date: 09/09/2021 ?Goal Status: Active ?Ulcer/skin breakdown will have a volume reduction of 30% by week 4 ?Date Initiated: 03/28/2021 ?Date Inactivated: 05/27/2021 ?Target Resolution Date: 05/31/2021 ?Goal  Status: Met ?Interventions: ?Assess patient/caregiver ability to obtain necessary supplies ?Assess patient/caregiver ability to perform ulcer/skin care regimen upon admission and as needed ?Assess ulceration(s) every visit ?Provide education on ulcer and skin care ?Treatment Activities: ?Topical wound management initiated : 03/28/2021 ?Notes: ?Electronic Signature(s) ?Signed: 08/27/2021 4:04:33 PM By: Sharyn Creamer RN, BSN ?Entered By: Sharyn Creamer on 08/26/2021 10:18:19 ?-------------------------------------------------------------------------------- ?Negative Pressure Wound Therapy Maintenance (NPWT) Details ?Patient Name: ?Date of Service: ?[place], [name] 08/26/2021 10:00 A M ?Medical Record Number: [id number] ?Patient Account Number: [id number] ?Date of Birth/Sex: ?Treating RN: ?Mar 04, 1966 (56 y.o. [name] ?Primary Care Mendell Bontempo: [name] ?Other Clinician: ?Referring Paraskevi Funez: ?Treating Avyon Herendeen/Extender: Kalman Shan ?[name], [place] ?Weeks in Treatment: 21 ?NPWT Maintenance Performed for: Wound #1 Midline Back ?Performed By: Sharyn Creamer, RN ?Type: VAC System ?Coverage Size (sq cm): 17.6 ?Pressure Type: Constant ?Pressure Setting: 125 mmHG ?Drain Type: None ?Primary Contact: Non-Adherent ?Sponge/Dressing Type: ?Combination : white and black foam ?Date Initiated: 03/28/2021 ?Dressing Removed: Yes ?Quantity of Sponges/Gauze Removed: 1 white foam, 1 black foam ?Canister Changed: No ?Dressing Reapplied: Yes ?Quantity of Sponges/Gauze Inserted: 1 white foam, 1 black foam ?Respones T Treatment: ?o pt tolerated well ?Days On NPWT : 152 ?Post Procedure Diagnosis ?Same as Pre-procedure ?Electronic Signature(s) ?Signed: 08/27/2021 4:04:33 PM By: Sharyn Creamer RN, BSN ?Entered By: Sharyn Creamer on 08/26/2021 11:10:06 ?-------------------------------------------------------------------------------- ?Pain Assessment Details ?Patient Name: ?Date of Service: ?[place], [name] 08/26/2021 10:00 A M ?Medical Record  Number: [id number] ?Patient Account Number: [id number] ?Date of Birth/Sex: ?Treating RN: ?10-24-1965 (56 y.o. [name] ?Primary Care Laetitia Schnepf: [name] ?Other Clinician: ?Referring Mallery Harshman: ?Treating Jerrico Covello/

## 2021-08-27 NOTE — Progress Notes (Signed)
DERRION, TRITZ ([id number]) ?Visit Report for 08/26/2021 ?Chief Complaint Document Details ?Patient Name: Date of Service: ?[place], [name] 08/26/2021 10:00 A M ?Medical Record Number: [id number] ?Patient Account Number: [id number] ?Date of Birth/Sex: Treating RN: ?07/29/1965 (56 y.o. [name], [name] ?Primary Care Provider: [name] Other Clinician: ?Referring Provider: ?Treating Provider/Extender: Kalman Shan ?[name], [place] ?Weeks in Treatment: 21 ?Information Obtained from: Patient ?Chief Complaint ?Back wound ?Electronic Signature(s) ?Signed: 08/26/2021 10:52:43 AM By: Kalman Shan DO ?Entered By: Kalman Shan on 08/26/2021 10:49:11 ?-------------------------------------------------------------------------------- ?HPI Details ?Patient Name: Date of Service: ?[place], [name] 08/26/2021 10:00 A M ?Medical Record Number: [id number] ?Patient Account Number: [id number] ?Date of Birth/Sex: Treating RN: ?[birth year] (56 y.o. [name], [name] ?Primary Care Provider: [name] Other Clinician: ?Referring Provider: ?Treating Provider/Extender: Kalman Shan ?[name], [place] ?Weeks in Treatment: 21 ?History of Present Illness ?HPI Description: Admission 03/28/2021 ?Mr. Treyshaun Keatts is a 56 year old male with a past medical history of controlled type 2 diabetes on oral agents, obesity and OSA that presents to the clinic for a ?back wound. On 01/11/2021 patient had a laminectomy with PLIF of L1-S1 by Dr. Venetia Constable because of lumbar stenosis and radiculopathy. He subsequently ?developed bacteremia. He had CT imaging on 10/13 of the lumbar spine that showed fluid collection in the soft tissue of the posterior L1 and S1 and was taken ?to the OR for washout on 10/14. 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 He received 4 weeks of IV antibiotics by infectious ?disease. After his completion of 4 weeks of IV antibiotics he was continued for an additional 4 weeks of IV cefazolin with a stop date of  12/29. He has been ?evaluated by plastic surgery and no plans for surgical intervention at this time. Wife is present and reports he has been on the wound VAC for the past 8 ?weeks with improvement in wound healing. He currently denies systemic signs of infection. ?12/22; patient presents for follow-up. He reports no issues since last clinic visit. He denies signs of infection. He has been tolerating the wound VAC well. ?12/30; patient presents for follow-up. He reports no issues and has no complaints today. He has been tolerating the wound VAC well. ?1/9; patient presents for follow-up. He has no issues or complaints today. He states he feels well. He has had no problems with the wound VAC. ?1/16; patient presents for follow-up. He continues to use the wound VAC with no issues. He denies signs of infection. ?1/23; patient presents for follow-up. He has been switched from IV cefazolin to oral cefadroxil by infectious disease. He has no issues or complaints today. He ?denies signs of infection. He continues to tolerate the wound VAC well. ?1/30; patient presents for follow-up. He continues to tolerate the wound VAC well. ?2/6; patient presents for follow-up. He has no issues or complaints today. He continues to tolerate the wound VAC well. He denies signs of infection. ?2/13; patient presents for follow-up. He continues to do well with the wound VAC. He denies any issues. ?2/27; patient presents for follow-up. He continues to use the wound VAC without any issues. He denies signs of infection. ?3/20; patient presents for follow-up. He has no issues or complaints today. He continues to use the wound VAC. ?4/3; patient presents for follow-up. He continues to use the wound VAC without issues. He denies signs of infection. ?4/17; 2-week follow-up. He continues to do well. His measurements are improved. Initially a surgical wound complicated by infection. ?5/1; patient  presents for follow-up. He has no issues or complaints  today. He continues to tolerate the wound VAC well. He denies signs of infection. ?5/15; patient presents for follow-up. He has noted more maceration to the periwound. He has been using the wound VAC without issues. He currently denies ?signs of infection. ?Electronic Signature(s) ?Signed: 08/26/2021 10:52:43 AM By: Kalman Shan DO ?Entered By: Kalman Shan on 08/26/2021 10:49:37 ?-------------------------------------------------------------------------------- ?Physical Exam Details ?Patient Name: Date of Service: ?[place], [name] 08/26/2021 10:00 A M ?Medical Record Number: [id number] ?Patient Account Number: [id number] ?Date of Birth/Sex: Treating RN: ?1966-03-08 (56 y.o. [name], [name] ?Primary Care Provider: [name] Other Clinician: ?Referring Provider: ?Treating Provider/Extender: Kalman Shan ?[name], [place] ?Weeks in Treatment: 21 ?Constitutional ?respirations regular, non-labored and within target range for patient.Marland Kitchen ?Psychiatric ?pleasant and cooperative. ?Notes ?Lumbar spine: T the previous incision site there is an open wound with granulation tissue throughout and tunneling at the 12 o'clock position. No signs of ?o ?surrounding infection. Appears well-healing. Periwound is slightly macerated. ?Electronic Signature(s) ?Signed: 08/26/2021 10:52:43 AM By: Kalman Shan DO ?Entered By: Kalman Shan on 08/26/2021 10:50:17 ?-------------------------------------------------------------------------------- ?Physician Orders Details ?Patient Name: Date of Service: ?[place], [name] 08/26/2021 10:00 A M ?Medical Record Number: [id number] ?Patient Account Number: [id number] ?Date of Birth/Sex: Treating RN: ?Aug 15, 1965 (56 y.o. 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 - w/ Dr. Heber [place] and [place] Room # 9 09/10/21 10:00am ?Return A ?Nurse Visit: - Thursday 08/29/21 @ 10:45am, Monday 09/02/21 @ 10:00am, Thursday 09/05/21 @ 10:00am ?Bathing/ Shower/ Hygiene ?May shower with protection but do not get wound dressing(s) wet. ?Negative Presssure Wound Therapy ?Wound Vac to wound continuously at 134m/hg pressure ?Black Foam - wound base then bridge to right side ?White Foam - T tunnel at 12:00 ?o ?Additional Orders / Instructions ?Follow Nutritious Diet - Continue to monitor blood sugars daily ?Wound Treatment ?Electronic Signature(s) ?Signed: 08/26/2021 1:00:30 PM By: HKalman ShanDO ?Signed: 08/27/2021 4:04:33 PM By: PSharyn CreamerRN, BSN ?Previous Signature: 08/26/2021 10:52:43 AM Version By: HKalman ShanDO ?Entered By: PSharyn Creameron 08/26/2021 11:29:32 ?-------------------------------------------------------------------------------- ?Problem List Details ?Patient Name: ?Date of Service: ?[place], [name] 08/26/2021 10:00 A M ?Medical Record Number: [id number]?Patient Account Number: [id number]?Date of Birth/Sex: ?Treating RN: ?[birth year]((56y.o. 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 08/26/2021 10:00 A M ?Medical Record Number: [id number]?Patient Account Number: [id number]

## 2021-08-28 ENCOUNTER — Other Ambulatory Visit (HOSPITAL_COMMUNITY): Payer: Self-pay

## 2021-08-29 ENCOUNTER — Encounter: Payer: Self-pay | Admitting: Physical Medicine and Rehabilitation

## 2021-08-29 ENCOUNTER — Ambulatory Visit: Payer: No Typology Code available for payment source

## 2021-08-29 ENCOUNTER — Encounter (HOSPITAL_BASED_OUTPATIENT_CLINIC_OR_DEPARTMENT_OTHER): Payer: No Typology Code available for payment source | Admitting: Internal Medicine

## 2021-08-29 ENCOUNTER — Other Ambulatory Visit (HOSPITAL_COMMUNITY): Payer: Self-pay

## 2021-08-29 DIAGNOSIS — M6281 Muscle weakness (generalized): Secondary | ICD-10-CM

## 2021-08-29 DIAGNOSIS — M545 Low back pain, unspecified: Secondary | ICD-10-CM | POA: Diagnosis not present

## 2021-08-29 DIAGNOSIS — L98424 Non-pressure chronic ulcer of back with necrosis of bone: Secondary | ICD-10-CM | POA: Diagnosis not present

## 2021-08-29 MED ORDER — MORPHINE SULFATE ER 15 MG PO TBCR
30.0000 mg | EXTENDED_RELEASE_TABLET | Freq: Two times a day (BID) | ORAL | 0 refills | Status: DC
  Filled 2021-08-29: qty 60, 15d supply, fill #0

## 2021-08-29 NOTE — Therapy (Signed)
OUTPATIENT PHYSICAL THERAPY TREATMENT NOTE/DISCHARGE  PHYSICAL THERAPY DISCHARGE SUMMARY  Visits from Start of Care: 22  Current functional level related to goals / functional outcomes: See goals and objective   Remaining deficits: See goals and objective   Education / Equipment: HEP   Patient agrees to discharge. Patient goals were met. Patient is being discharged due to meeting the stated rehab goals.   Patient Name: BRENTYN SEEHAFER MRN: [id number] DOB:Aug 01, 1965, 56 y.o., male Today's Date: 08/29/2021  PCP: Colon Branch, MD REFERRING PROVIDER: Bary Leriche, PA-C   PT End of Session - 08/29/21 1407     Visit Number 22    Number of Visits 25    Date for PT Re-Evaluation 08/31/21    Authorization Type [facility]    PT Start Time 1407    PT Stop Time 1445    PT Time Calculation (min) 38 min    Equipment Utilized During Treatment Other (comment);Gait belt   SPC   Activity Tolerance Patient tolerated treatment well    Behavior During Therapy WFL for tasks assessed/performed                               Past Medical History:  Diagnosis Date   DDD (degenerative disc disease), lumbar    Diabetes mellitus    Hyperlipemia    Hypertension    Obesity    OSA (obstructive sleep apnea)    on CPAP   Past Surgical History:  Procedure Laterality Date   APPLICATION OF ROBOTIC ASSISTANCE FOR SPINAL PROCEDURE N/A 01/11/2021   Procedure: APPLICATION OF ROBOTIC ASSISTANCE FOR SPINAL PROCEDURE;  Surgeon: Judith Part, MD;  Location: [place];  Service: Neurosurgery;  Laterality: N/A;   APPLICATION OF WOUND VAC N/A 01/25/2021   Procedure: APPLICATION OF WOUND VAC;  Surgeon: Dawley, Theodoro Doing, DO;  Location: [place] [facility];  Service: Neurosurgery;  Laterality: N/A;   BACK SURGERY     COLONOSCOPY  2019   LUMBAR WOUND DEBRIDEMENT N/A 01/25/2021   Procedure: Irrigation and Debridement of lumbar wound, and wound vacuum assisted closure;  Surgeon: Dawley, Theodoro Doing, DO;   Location: [facility];  Service: Neurosurgery;  Laterality: N/A;   TRANSFORAMINAL LUMBAR INTERBODY FUSION (TLIF) WITH PEDICLE SCREW FIXATION 4 LEVEL N/A 01/11/2021   Procedure: Lumbar one-two, Lumbar two-three, Lumbar three-four, Lumbar four-five, Lumbar five Sacral one Open decompression, Transforaminal lumbar interbody fusion, posterolateral instrumented fusion;  Surgeon: Judith Part, MD;  Location: [place];  Service: Neurosurgery;  Laterality: N/A;   Patient Active Problem List   Diagnosis Date Noted   Myofascial pain 08/14/2021   Presence of retained hardware 23/53/6144   Hardware complicating wound infection ([place]) 03/13/2021   Medication monitoring encounter 03/13/2021   Slow transit constipation    Disto-occlusion    Lumbar discitis    Epidural abscess    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[facility])    Hypoalbuminemia due to protein-calorie malnutrition ([facility])    Acute blood loss anemia    Other chronic postprocedural pain    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   Klebsiella pneumoniae infection 01/28/2021   Wound infection after surgery 01/25/2021   Muscle spasms of both lower extremities 01/25/2021   Abdominal pain 01/25/2021   Spleen enlarged 01/25/2021   Lumbar radiculopathy 01/11/2021   Spasticity 12/03/2020   Neuropathy 09/28/2020   Chronic pain syndrome 09/28/2020   Intervertebral lumbar disc disorder with myelopathy, lumbar region 09/28/2020   Spondylosis, cervical, with  myelopathy 09/28/2020   Unilateral primary osteoarthritis, right knee 10/28/2019   PCP NOTES >>>>>>>> 07/09/2015   DJD (degenerative joint disease) 10/27/2014   Screening for heart disease 06/28/2012   OSA (obstructive sleep apnea) 06/14/2012   ED (erectile dysfunction) 04/26/2012   Annual physical exam 12/29/2011   Achilles tendinitis 09/10/2010   Allergic rhinitis 07/12/2010   Hyperlipidemia 04/26/2007   DM II (diabetes mellitus, type II), controlled ([place]) 04/22/2006   OBESITY NOS 04/22/2006   Essential  hypertension 04/22/2006    REFERRING DIAG:  M51.06 (ICD-10-CM) - Lumbar disc herniation with myelopathy   THERAPY DIAG:  Chronic bilateral low back pain, unspecified whether sciatica present  Muscle weakness (generalized)  PERTINENT HISTORY:  s/p L1-S1 decompression and instrumented fusion 01/11/21 Inpatient rehab: 01/18/21- 03/08/21 Incision and debridement of lumbar wound, placement of wound VAC 01/25/21 Wound care visits occurring on Monday and Thursday  PRECAUTIONS: Wound and Falls  SUBJECTIVE:  Pt presents to PT with continued reports of lower back pain, but notes that he is feeling well. He has remained compliant with HEP with no adverse effect. Pt is ready to begin PT at this time.  Pain Assessment: Are you having pain? Yes NPRS scale: 6/10 Pain location: low back  PAIN TYPE: stabbing, dull  Pain description: constant  Aggravating factors: prolonged sitting  Relieving factors: laying supine    OBJECTIVE:   OUTCOMES:  FOTO: 55% function   BERG BALANCE Sitting to Standing: Numbers; 0-4: 4  4. Stands without using hands and stabilize independently  3. Stands independently using hands  2. Stands using hands after multiple trials  1. Min A to stand  0. Mod-Max A to stand Standing unsupported: Numbers; 0-4: 4  4. Stands safely for 2 minutes  3. Stands 2 minutes with supervision  2. Stands 30 seconds unsupported  1. Needs several tries to stand unsupported for 30 seconds  0. Unable to stand unsupported for 30 seconds Sitting unsupported: Numbers; 0-4: 4  4. Sits for 2 minutes independently  3. Sits for 2 minutes with supervision  2. Able to sit 30 seconds  1. Able to sit 10 seconds  0. Unable to sit for 10 seconds Standing to Sitting: Numbers; 0-4: 4 4. Sits safely with minimal use of hands 3. Controls descent with hands 2. Uses back of legs against chair to control descent 1. Sits independently, but uncontrolled descent 0. Needs assistance Transfers:  Numbers; 0-4: 4  4. Transfers safely with minor use of hands  3. Transfers safely definite use of hands  2. Transfers with verbal cueing/supervision  1. Needs 1 person assist  0. Needs 2 person assist  Standing with eyes closed: Numbers; 0-4: 4  4. Stands safely for 10 seconds  3. Stands 10 seconds with supervision   2. Able to stand for 3 seconds  1. Unable to keep eyes closed for 3 seconds, but is safe  0. Needs assist to keep from falling Standing with feet together: Numbers; 0-4: 4 4. Stands for 1 minute safely 3. Stands for 1 minute with supervision 2. Unable to hold for 30 seconds  1. Needs help to attain position but can hold for 15 seconds  0. Needs help to attain position and unable to hold for 15 seconds Reaching forward with outstretched arm: Numbers; 0-4: 4  4. Reaches forward 10 inches  3. Reaches forward 5 inches  2. Reaches forward 2 inches  1. Reaches forward with supervision  0. Loses balance/requires assistace Retrieving object from the floor: Numbers;  0-4: 4 4. Able to pick up easily and safely 3. Able to pick up with supervision 2. Unable to pick up, but reaches within 1-2 inches independently 1. Unable to pick up and needs supervision 0. Unable/needs assistance to keep from falling  Turning to look behind: Numbers; 0-4: 4  4. Looks behind from both sides and weight shifts well  3. Looks behind one side only, other side less weight shift  2. Turns sideways only, maintains balance  1. Needs supervision when turning  0. Needs assistance  Turning 360 degrees: Numbers; 0-4: 4  4. Able to turn in </=4 seconds  3. Able to turn on one side in </= 4 seconds   2. Able to turn slowly, but safely  1. Needs supervision or verbal cueing  0. Needs assistance Place alternate foot on stool: Numbers; 0-4: 4 4. Completes 8 steps in 20 seconds 3. Completes 8 steps in >20 seconds 2. 4 steps without assistance/supervision 1. Completes >2 steps with minimal assist 0.  Unable, needs assist to keep from falling Standing with one foot in front: Numbers; 0-4: 4  4. Independent tandem for 30 seconds  3. Independent foot ahead for 30 seconds  2. Independent small step for 30 seconds  1. Needs help to step, but can hold for 15 seconds  0. Loses balance while standing/stepping Standing on one foot: Numbers; 0-4: 3 4. Holds >10 seconds 3. Holds 5-10 seconds 2. Holds >/=3 seconds  1. Holds <3 seconds 0. Unable   Total Score: 55/56   LE MMT:   MMT Right 06/05/2021 Left 06/05/2021  Hip flexion 3- 3-  Hip extension      Hip abduction      Hip adduction      Hip internal rotation      Hip external rotation      Knee flexion 4 4  Knee extension 4+ 4+  Ankle dorsiflexion 5 5  Ankle plantarflexion 4 4  Ankle inversion      Ankle eversion       (Blank rows = not tested)   FUNCTIONAL TESTS:              TUG: 14 seconds with no AD   TODAY'S TREATMENT  [facility] Adult PT Treatment:                                                DATE: 08/29/2021 Therapeutic Exercise: Lateral walk BTB x 2 laps in // Standing hip abd/ext x 15 each SLS x 30" each Tandem stance x 60" each Chop black TB x 15 each Paloff black TB x 10 each STS x 10 - holding 5# DB in each hand Therapeutic Activity: Assessment of tests/measures, goals, and outcomes for purposes of discharge   [facility] Adult PT Treatment:                                                DATE: 08/26/2021 Therapeutic Exercise: NuStep lvl 7 UE/LE x 5 min Row 3x10 23#  Shoulder ext 3x10 23# Pallof press 2x10 13# Standing chop 2x10 13# each STS from elevated table 2x10 - 15# KB KB Deadlift 25# 10in 3x10  Standing hip abd/ext 3x10 50# Seated knee extension 3x15  25# Seated hamstring curl 3x15 65# Sled push/pull 4x35f 30#  [facility] Adult PT Treatment:                                                DATE: 08/22/2021 Therapeutic Exercise: NuStep lvl 7 UE/LE x 4 min Row 3x10 27#  Shoulder ext 3x10 23# Pallof press 2x10  10# Standing chop 2x10 10# each STS from elevated table 2x10 - 15# KB Seated horizontal abd 3x15 black TB KB Deadlift 15# 10in 3x10  Standing hip abd/ext 3x10 50# Seated knee extension 3x15 25# Seated hamstring curl 3x15 65# Sled push/pull 4x252f30#  PATIENT EDUCATION:  Education details: utilized [facility]The [facility]or household ambulation Person educated: patient Education method: instruction Education comprehension: verbalized understanding      HOME EXERCISE PROGRAM: Access Code: [email or web address]: [email or web address] Date: 08/29/2021 Prepared by: DaOctavio MannsExercises - Side Stepping with Resistance at Ankles and Counter Support  - 4-5 x weekly - 5 reps - blue theraband hold - Standing Hip Extension with Resistance at Ankles and Counter Support  - 4-5 x weekly - 3 sets - 10 reps - blue theraband hold - Standing Hip Abduction with Resistance at Ankles and Counter Support  - 4-5 x weekly - 3 sets - 10 reps - blue theraband hold - Tandem Stance  - 4-5 x weekly - 2-3 reps - 1 minute hold - Standing Single Leg Stance with Counter Support  - 4-5 x weekly - 2-3 reps - 30 seconds hold - Standing Anti-Rotation Press with Anchored Resistance  - 4-5 x weekly - 2-3 sets - 10-15 reps - Standing Diagonal Chop  - 4-5 x weekly - 2-3 sets - 10-15 reps - Sit to Stand Without Arm Support  - 4-5 x weekly - 3 sets - 10 reps - 5-10lbs hold     ASSESSMENT:   CLINICAL IMPRESSION: Pt was able to complete all prescribed exercises and demonstrated knowledge of HEP with no adverse effect. Over the course of PT treatment pt was able to complete all goals and improve gait and functional ability, progressing to independent gait with no AD. He continues to have pain, but has improved to the point that he no longer requires skilled PT services and should continue to improve with HEP compliance. Pt is in agreement with current plan and is ready to discharge from skilled PT at this time.    OBJECTIVE  IMPAIRMENTS Abnormal gait, decreased activity tolerance, decreased balance, decreased endurance, decreased mobility, difficulty walking, decreased ROM, decreased strength, impaired flexibility, improper body mechanics, postural dysfunction, obesity, and pain.    ACTIVITY LIMITATIONS cleaning, community activity, driving, meal prep, occupation, laundry, shopping, and yard work.    PERSONAL FACTORS Age, Fitness, and Time since onset of injury/illness/exacerbation are also affecting patient's functional outcome.      GOALS: Goals reviewed with patient? No   SHORT TERM GOALS:   STG Name Target Date Goal status  1 Patient will be independent and compliant with initial HEP.    Baseline:  06/26/2021 MET  2 Patient will be able to complete sit <> stand transfer without use of UE support.  Baseline:  MET - form higher surface 07/17/2021 MET  3 Patient will complete TUG in <15 seconds with LRAD to reduce his risk of future falls.  Baseline: 07/17/2021 MET  4 Patient will tolerate at least 15  minutes of continuous standing activity without LOB.  Baseline: frequent rest breaks during BERG with LOB  07/17/2021 MET    LONG TERM GOALS:    LTG Name Target Date Goal status  1 Patient will be modified independent with stair negotiation.  Baseline: 08/28/2021 MET  2 Patient will be modified independent with floor transfer Baseline: per patient required 4 person assist to transfer when he had his fall.  08/28/2021 MET  3 Patient will score at least 45/56 on the BERG to signify improvements in balance and reduction in fall risk.  Baseline: 08/29/2021: 55/56 08/28/2021 MET  4 Patient will be tolerate at least 10 minutes of walking with LRAD to improve his independence with household and community ambulation.  Baseline: SBA with RW in house; manual W/C community ambulation 08/29/2021: MET 08/28/2021 MET  5 Patient will score at least 40% function on FOTO to signify clinically meaningful improvement in function.   06/24/2021: 24% function 08/29/2021: 55% function 08/28/21 MET    PLAN: PT FREQUENCY: 2x/week   PT DURATION: 12 weeks   PLANNED INTERVENTIONS: Therapeutic exercises, Therapeutic activity, Neuro Muscular re-education, Balance training, Gait training, Patient/Family education, Stair training, Wheelchair mobility training, Cryotherapy, Moist heat, and Manual therapy   PLAN FOR NEXT SESSION: standing strengthening as tolerated, balance, hip strengthening. stair training with Martin    Ward Chatters PT 08/29/21 3:36 PM

## 2021-09-02 ENCOUNTER — Other Ambulatory Visit (HOSPITAL_COMMUNITY): Payer: Self-pay

## 2021-09-02 ENCOUNTER — Encounter (HOSPITAL_BASED_OUTPATIENT_CLINIC_OR_DEPARTMENT_OTHER): Payer: No Typology Code available for payment source | Admitting: Internal Medicine

## 2021-09-02 DIAGNOSIS — L98424 Non-pressure chronic ulcer of back with necrosis of bone: Secondary | ICD-10-CM | POA: Diagnosis not present

## 2021-09-02 NOTE — Progress Notes (Signed)
SAEL, FURCHES ([id number]) Visit Report for 09/02/2021 SuperBill Details Patient Name: Date of Service: SHAW, DOBEK 09/02/2021 Medical Record Number: [id number] Patient Account Number: [id number] Date of Birth/Sex: Treating RN: [birth year] (56 y.o. 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

## 2021-09-03 NOTE — Progress Notes (Signed)
Christian, Murphy ([id number]) Visit Report for 08/29/2021 Arrival Information Details Patient Name: Date of Service: [name], [name] 08/29/2021 10:45 A M Medical Record Number: [id number] Patient Account Number: [id number] Date of Birth/Sex: Treating RN: 18-Jun-1965 (56 y.o. [name], [name] Primary Care Louanna Vanliew: [name] Other Clinician: Referring Miquel Stacks: Treating Angell Honse/Extender: [name] Weeks in Treatment: 23 Visit Information History Since Last Visit Added or deleted any medications: No Patient Arrived: Ambulatory Any new allergies or adverse reactions: No Arrival Time: 10:39 Had a fall or experienced change in No Accompanied By: self activities of daily living that may affect Transfer Assistance: None risk of falls: Patient Identification Verified: Yes Signs or symptoms of abuse/neglect since last visito No Secondary Verification Process Completed: Yes Hospitalized since last visit: No Patient Requires Transmission-Based Precautions: No Implantable device outside of the clinic excluding No Patient Has Alerts: Yes cellular tissue based products placed in the center Patient Alerts: Patient on Blood Thinner since last visit: No BP Right Arm-PICC Has Dressing in Place as Prescribed: Yes Pain Present Now: No Electronic Signature(s) Signed: 09/03/2021 4:06:07 PM By: [name] RN Entered By: [name] on 09/03/2021 10:39:46 -------------------------------------------------------------------------------- Encounter Discharge Information Details Patient Name: Date of Service: Christian Brown E. 08/29/2021 10:45 A M Medical Record Number: [id number] Patient Account Number: [id number] Date of Birth/Sex: Treating RN: 1965/10/07 (56 y.o. [name], [name] Primary Care Camala Talwar: [name] Other Clinician: Referring Selvin Yun: Treating Mahdiya Mossberg/Extender: [name] Weeks in Treatment: 22 Encounter Discharge Information  Items Discharge Condition: Stable Ambulatory Status: Ambulatory Discharge Destination: Home Transportation: Private Auto Accompanied By: self Schedule Follow-up Appointment: Yes Clinical Summary of Care: Patient Declined Electronic Signature(s) Signed: 09/03/2021 4:06:07 PM By: [name] RN Entered By: [name] on 09/03/2021 10:41:53 -------------------------------------------------------------------------------- Negative Pressure Wound Therapy Maintenance (NPWT) Details Patient Name: Date of Service: Christian, Murphy 08/29/2021 10:45 A M Medical Record Number: [id number] Patient Account Number: [id number] Date of Birth/Sex: Treating RN: 05-Sep-1965 (56 y.o. [name], [name] Primary Care Marian Meneely: [name] Other Clinician: Referring Christel Bai: Treating Keri Tavella/Extender: [name] Weeks in Treatment: 22 NPWT Maintenance Performed for: Wound #1 Midline Back Performed By: [name], RN Type: VAC System Coverage Size (sq cm): 17.6 Pressure Type: Constant Pressure Setting: 125 mmHG Drain Type: None Primary Contact: Non-Adherent Sponge/Dressing Type: Combination : 1 white 1 black Date Initiated: 03/28/2021 Dressing Removed: Yes Quantity of Sponges/Gauze Removed: 1 white 1 black Canister Changed: Yes Canister Exudate Volume: 50 Dressing Reapplied: Yes Quantity of Sponges/Gauze Inserted: 1 white 1 black Respones T Treatment: o tolerates well Days On NPWT : 155 Electronic Signature(s) Signed: 09/03/2021 4:06:07 PM By: [name] RN Entered By: [name] on 09/03/2021 10:41:02 -------------------------------------------------------------------------------- Patient/Caregiver Education Details Patient Name: Date of Service: [name], Christian [name]. 5/18/2023andnbsp10:45 A M Medical Record Number: [id number] Patient Account Number: [id number] Date of Birth/Gender: Treating RN: 09-16-1965 (56 y.o. [name] Primary  Care Physician: [name] Other Clinician: Referring Physician: Treating Physician/Extender: [name] in Treatment: 22 Education Assessment Education Provided To: Patient Education Topics Provided Wound/Skin Impairment: Methods: Explain/Verbal Responses: Reinforcements needed Electronic Signature(s) Signed: 09/03/2021 4:06:07 PM By: [name] RN Entered By: [name] on 09/03/2021 10:41:34 -------------------------------------------------------------------------------- Wound Assessment Details Patient Name: Date of Service: Christian Brown E. 08/29/2021 10:45 A M Medical Record Number: [id number] Patient Account Number: [id number] Date of Birth/Sex: Treating RN: Oct 18, 1965 (56 y.o. [name] Primary Care Lily Kernen: [name] Other Clinician: Referring Tykiera Raven: Treating Daiton Cowles/Extender: Heber [place],  [place], Jose Weeks in Treatment: 22 Wound Status Wound Number: 1 Primary Etiology: Open Surgical Wound Wound Location: Midline Back Wound Status: Open Wounding Event: Surgical Injury Date Acquired: 01/11/2021 Weeks Of Treatment: 22 Clustered Wound: No Wound Measurements Length: (cm) 8 Width: (cm) 2.2 Depth: (cm) 1 Area: (cm) 13.823 Volume: (cm) 13.823 % Reduction in Area: 81.7% % Reduction in Volume: 92% Wound Description Classification: Full Thickness With Exposed Support Structure Exudate Amount: Medium Exudate Type: Serosanguineous Exudate Color: red, brown s Treatment Notes Wound #1 (Back) Wound Laterality: Midline Cleanser Peri-Wound Care Topical Primary Dressing Secondary Dressing Secured With Compression Wrap Compression Stockings Add-Ons Electronic Signature(s) Signed: 09/03/2021 4:06:07 PM By: [name] RN Entered By: [name] on 09/03/2021 10:40:25 -------------------------------------------------------------------------------- Vitals Details Patient Name: Date of  Service: Christian Brown E. 08/29/2021 10:45 A M Medical Record Number: [id number] Patient Account Number: [id number] Date of Birth/Sex: Treating RN: 02-Jan-1966 (56 y.o. [name] Primary Care Tasneem Cormier: [name] Other Clinician: Referring Delita Chiquito: Treating Jesiah Grismer/Extender: [name] Weeks in Treatment: 22 Vital Signs Time Taken: 10:45 Temperature (F): 98.7 Pulse (bpm): 98 Respiratory Rate (breaths/min): 17 Blood Pressure (mmHg): 153/88 Reference Range: 80 - 120 mg / dl Electronic Signature(s) Signed: 09/03/2021 4:06:07 PM By: [name] RN Entered By: [name] on 09/03/2021 10:40:18

## 2021-09-03 NOTE — Progress Notes (Signed)
[name], [name] ([id number]) Visit Report for 08/29/2021 SuperBill Details Patient Name: Date of Service: [name], [name] 08/29/2021 Medical Record Number: [id number] Patient Account Number: [id number] Date of Birth/Sex: Treating RN: [birth year] (56 y.o. 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

## 2021-09-05 ENCOUNTER — Encounter (HOSPITAL_BASED_OUTPATIENT_CLINIC_OR_DEPARTMENT_OTHER): Payer: No Typology Code available for payment source | Admitting: Internal Medicine

## 2021-09-05 DIAGNOSIS — L98424 Non-pressure chronic ulcer of back with necrosis of bone: Secondary | ICD-10-CM | POA: Diagnosis not present

## 2021-09-05 NOTE — Progress Notes (Signed)
[name] ([id number]) Visit Report for 09/05/2021 Arrival Information Details Patient Name: Date of Service: [name], [name] 09/05/2021 10:00 A M Medical Record Number: [id number] Patient Account Number: [id number] Date of Birth/Sex: Treating RN: [birth year] (56 y.o. [name] Primary Care Sanaai Doane: [name] Other Clinician: Referring [name]: Treating [name]/Extender: [name] in Treatment: 23 Visit Information History Since Last Visit Added or deleted any medications: No Patient Arrived: Ambulatory Any new allergies or adverse reactions: No Arrival Time: 10:50 Had a fall or experienced change in No Accompanied By: friends and family activities of daily living that may affect Transfer Assistance: None risk of falls: Patient Identification Verified: Yes Signs or symptoms of abuse/neglect since last visito No Secondary Verification Process Completed: Yes Hospitalized since last visit: No Patient Requires Transmission-Based Precautions: No Implantable device outside of the clinic excluding No Patient Has Alerts: Yes cellular tissue based products placed in the center Patient Alerts: Patient on Blood Thinner since last visit: No BP Right Arm-PICC Has Dressing in Place as Prescribed: Yes Pain Present Now: Yes Electronic Signature(s) Signed: 09/05/2021 3:50:23 PM By: [name] RN Entered By: [name] on 09/05/2021 10:50:36 -------------------------------------------------------------------------------- Encounter Discharge Information Details Patient Name: Date of Service: Christian Brown E. 09/05/2021 10:00 A M Medical Record Number: [id number] Patient Account Number: [id number] Date of Birth/Sex: Treating RN: 1965-12-06 (56 y.o. [name] Primary Care Aryssa Rosamond: [name] Other Clinician: Referring Avaneesh Pepitone: Treating Adelie Croswell/Extender: [name] Weeks in Treatment: 23 Encounter Discharge  Information Items Discharge Condition: Stable Ambulatory Status: Ambulatory Discharge Destination: Home Transportation: Private Auto Accompanied By: friend and family's Schedule Follow-up Appointment: Yes Clinical Summary of Care: Patient Declined Electronic Signature(s) Signed: 09/05/2021 3:50:23 PM By: [name] RN Entered By: [name] on 09/05/2021 10:52:50 -------------------------------------------------------------------------------- Negative Pressure Wound Therapy Maintenance (NPWT) Details Patient Name: Date of Service: [name] 09/05/2021 10:00 A M Medical Record Number: [id number] Patient Account Number: [id number] Date of Birth/Sex: Treating RN: 05-06-1965 (56 y.o. [name] Primary Care [name]: [name] Other Clinician: Referring [name]: Treating [name]/Extender: [name] Weeks in Treatment: 23 NPWT Maintenance Performed for: Wound #1 Midline Back Performed By: [name], RN Type: VAC System Coverage Size (sq cm): 17.6 Pressure Type: Constant Pressure Setting: 125 mmHG Drain Type: None Primary Contact: Non-Adherent Sponge/Dressing Type: Combination : 1 white 1 black Date Initiated: 03/28/2021 Dressing Removed: Yes Quantity of Sponges/Gauze Removed: 1 white 1 black Canister Changed: No Canister Exudate Volume: 25 Dressing Reapplied: Yes Quantity of Sponges/Gauze Inserted: 1 white 1 black Respones T Treatment: o tolerates well Days On NPWT : 162 Electronic Signature(s) Signed: 09/05/2021 3:50:23 PM By: [name] RN Entered By: [name] on 09/05/2021 10:51:34 -------------------------------------------------------------------------------- Patient/Caregiver Education Details Patient Name: Date of Service: [name] [name]. 5/25/2023andnbsp10:00 [place] Record Number: [id number] Patient Account Number: [id number] Date of Birth/Gender: Treating RN: 09-26-1965 (56 y.o. [name] Primary Care Physician: [name] Other Clinician: Referring Physician: Treating Physician/Extender: [name] in Treatment: 23 Education Assessment Education Provided To: Patient Education Topics Provided Wound/Skin Impairment: Methods: Explain/Verbal Responses: State content correctly [employer]) Signed: 09/05/2021 3:50:23 PM By: [name] RN Entered By: [name] on 09/05/2021 10:52:31 -------------------------------------------------------------------------------- Wound Assessment Details Patient Name: Date of Service: Christian Brown E. 09/05/2021 10:00 A M Medical Record Number: [id number] Patient Account Number: [id number] Date of Birth/Sex: Treating RN: 13-Aug-1965 (55 y.o. [name] Primary Care [name]: [name] Other Clinician:  Referring [name]: Treating [name]/Extender: [name] Weeks in Treatment: 23 Wound Status Wound Number: 1 Primary Etiology: Open Surgical Wound Wound Location: Midline Back Wound Status: Open Wounding Event: Surgical Injury Date Acquired: 01/11/2021 Weeks Of Treatment: 23 Clustered Wound: No Wound Measurements Length: (cm) 8 Width: (cm) 2.2 Depth: (cm) 1 Area: (cm) 13.823 Volume: (cm) 13.823 % Reduction in Area: 81.7% % Reduction in Volume: 92% Wound Description Classification: Full Thickness With Exposed Support Structure Exudate Amount: Medium Exudate Type: Serosanguineous Exudate Color: red, brown s Treatment Notes Wound #1 (Back) Wound Laterality: Midline Cleanser Peri-Wound Care Topical Primary Dressing Secondary Dressing Secured With Compression Wrap Compression Stockings Add-Ons Electronic Signature(s) Signed: 09/05/2021 3:50:23 PM By: [name] RN Entered By: [name] on 09/05/2021 10:51:00 -------------------------------------------------------------------------------- Vitals Details Patient  Name: Date of Service: Christian Brown E. 09/05/2021 10:00 A M Medical Record Number: [id number] Patient Account Number: [id number] Date of Birth/Sex: Treating RN: 05-21-1965 (56 y.o. [name] Primary Care [place] Murphy: [name] Other Clinician: Referring Jessamy Torosyan: Treating Keyondra Lagrand/Extender: [name] Weeks in Treatment: 23 Vital Signs Time Taken: 10:50 Temperature (F): 97.7 Pulse (bpm): 102 Respiratory Rate (breaths/min): 17 Blood Pressure (mmHg): 133/70 Reference Range: 80 - 120 mg / dl Electronic Signature(s) Signed: 09/05/2021 3:50:23 PM By: [name] RN Entered By: [name] on 09/05/2021 10:50:53

## 2021-09-05 NOTE — Progress Notes (Signed)
BERLE, FITZ ([id number]) Visit Report for 09/05/2021 SuperBill Details Patient Name: Date of Service: KENZO, OZMENT 09/05/2021 Medical Record Number: [id number] Patient Account Number: [id number] Date of Birth/Sex: Treating RN: 1965-09-19 (56 y.o. 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

## 2021-09-10 ENCOUNTER — Encounter (HOSPITAL_BASED_OUTPATIENT_CLINIC_OR_DEPARTMENT_OTHER): Payer: No Typology Code available for payment source | Admitting: Internal Medicine

## 2021-09-10 DIAGNOSIS — M869 Osteomyelitis, unspecified: Secondary | ICD-10-CM | POA: Diagnosis not present

## 2021-09-10 DIAGNOSIS — E11622 Type 2 diabetes mellitus with other skin ulcer: Secondary | ICD-10-CM | POA: Diagnosis not present

## 2021-09-10 DIAGNOSIS — L98424 Non-pressure chronic ulcer of back with necrosis of bone: Secondary | ICD-10-CM | POA: Diagnosis not present

## 2021-09-11 NOTE — Progress Notes (Signed)
EASTIN, SWING ([id number]) Visit Report for 09/10/2021 Chief Complaint Document Details Patient Name: Date of Service: [name], [name] 09/10/2021 10:00 A M Medical Record Number: [id number] Patient Account Number: [id number] Date of Birth/Sex: Treating RN: [birth year] (56 y.o. [name] Primary Care Provider: [name] Other Clinician: Referring Provider: Treating Provider/Extender: [name] Weeks in Treatment: 23 Information Obtained from: Patient Chief Complaint Back wound Electronic Signature(s) Signed: 09/10/2021 11:00:37 AM By: Kalman Shan DO Entered By: Kalman Shan on 09/10/2021 10:54:26 -------------------------------------------------------------------------------- HPI Details Patient Name: Date of Service: Christian Brown E. 09/10/2021 10:00 A M Medical Record Number: [id number] Patient Account Number: [id number] Date of Birth/Sex: Treating RN: 07-Apr-1966 (56 y.o. [name] Primary Care Provider: [name] Other Clinician: Referring Provider: Treating Provider/Extender: [name] Weeks in Treatment: 23 History of Present Illness HPI Description: Admission 03/28/2021 [name] is a 56 year old male with a past medical history of controlled type 2 diabetes on oral agents, obesity and OSA that presents to the clinic for a back wound. On 01/11/2021 patient had a laminectomy with PLIF of L1-S1 by Dr. Venetia Constable because of lumbar stenosis and radiculopathy. He subsequently developed bacteremia. He had CT imaging on 10/13 of the lumbar spine that showed fluid collection in the soft tissue of the posterior L1 and S1 and was taken to the OR for washout on 10/14. 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 He received 4 weeks of IV antibiotics by infectious disease. After his completion of 4 weeks of IV antibiotics he was continued for an additional 4 weeks of IV cefazolin with a stop date of  12/29. He has been evaluated by plastic surgery and no plans for surgical intervention at this time. Wife is present and reports he has been on the wound VAC for the past 8 weeks with improvement in wound healing. He currently denies systemic signs of infection. 12/22; patient presents for follow-up. He reports no issues since last clinic visit. He denies signs of infection. He has been tolerating the wound VAC well. 12/30; patient presents for follow-up. He reports no issues and has no complaints today. He has been tolerating the wound VAC well. 1/9; patient presents for follow-up. He has no issues or complaints today. He states he feels well. He has had no problems with the wound VAC. 1/16; patient presents for follow-up. He continues to use the wound VAC with no issues. He denies signs of infection. 1/23; patient presents for follow-up. He has been switched from IV cefazolin to oral cefadroxil by infectious disease. He has no issues or complaints today. He denies signs of infection. He continues to tolerate the wound VAC well. 1/30; patient presents for follow-up. He continues to tolerate the wound VAC well. 2/6; patient presents for follow-up. He has no issues or complaints today. He continues to tolerate the wound VAC well. He denies signs of infection. 2/13; patient presents for follow-up. He continues to do well with the wound VAC. He denies any issues. 2/27; patient presents for follow-up. He continues to use the wound VAC without any issues. He denies signs of infection. 3/20; patient presents for follow-up. He has no issues or complaints today. He continues to use the wound VAC. 4/3; patient presents for follow-up. He continues to use the wound VAC without issues. He denies signs of infection. 4/17; 2-week follow-up. He continues to do well. His measurements are improved. Initially a surgical wound complicated by infection. 5/1; patient  presents for follow-up. He has no issues or complaints  today. He continues to tolerate the wound VAC well. He denies signs of infection. 5/15; patient presents for follow-up. He has noted more maceration to the periwound. He has been using the wound VAC without issues. He currently denies signs of infection. 5/30; patient presents for follow-up. He has been tolerating the wound VAC well over the past 2 weeks. He no longer has maceration to the periwound. He has no issues or complaints today. Electronic Signature(s) Signed: 09/10/2021 11:00:37 AM By: Kalman Shan DO Entered By: Kalman Shan on 09/10/2021 10:55:42 -------------------------------------------------------------------------------- Physical Exam Details Patient Name: Date of Service: Christian Brown E. 09/10/2021 10:00 A M Medical Record Number: [id number] Patient Account Number: [id number] Date of Birth/Sex: Treating RN: [birth year] (56 y.o. [name] Primary Care Provider: [name] Other Clinician: Referring Provider: Treating Provider/Extender: [name] Weeks in Treatment: 23 Constitutional respirations regular, non-labored and within target range for patient.Marland Kitchen Psychiatric pleasant and cooperative. Notes Lumbar spine: T the previous incision site there is an open wound with granulation tissue throughout and tunneling at the 12 o'clock position. No signs of o surrounding infection. Appears well-healing. Electronic Signature(s) Signed: 09/10/2021 11:00:37 AM By: Kalman Shan DO Entered By: Kalman Shan on 09/10/2021 10:56:23 -------------------------------------------------------------------------------- Physician Orders Details Patient Name: Date of Service: Christian Brown E. 09/10/2021 10:00 A M Medical Record Number: [id number] Patient Account Number: [id number] Date of Birth/Sex: Treating RN: [birth year] (56 y.o. [name] Primary Care Provider: [name] Other Clinician: Referring Provider: Treating Provider/Extender:  [name] Weeks in Treatment: 23 Verbal / Phone Orders: No Diagnosis Coding Follow-up Appointments ppointment in 2 weeks. - w/ Dr. Heber [place] Dellia Nims) and [facility] Room # 9 on Thursday 09/19/21 Return A Nurse Visit: - on Thursday 09/12/21, Monday 09/16/21 w/ [place] Room # 9 Bathing/ Shower/ Hygiene May shower with protection but do not get wound dressing(s) wet. Negative Presssure Wound Therapy Wound Vac to wound continuously at 131m/hg pressure Black Foam - wound base then bridge to right side White Foam - T tunnel at 12:00 o Additional Orders / Instructions Follow Nutritious Diet - Continue to monitor blood sugars daily Wound Treatment Wound #1 - Back Wound Laterality: Midline Prim Dressing: Promogran Prisma Matrix, 4.34 (sq in) (silver collagen) ary Discharge Instructions: Moisten collagen with saline or hydrogel and pack into tunnel @ 12 Electronic Signature(s) Signed: 09/10/2021 11:00:37 AM By: HKalman ShanDO Entered By: HKalman Shanon 09/10/2021 10:56:34 -------------------------------------------------------------------------------- Problem List Details Patient Name: Date of Service: [name] 09/10/2021 10:00 A M Medical Record Number: [id number] Account Number: [id number] of Birth/Sex: Treating RN: [birth year](56y.o. 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 09/10/2021 10:00 A M Medical Record Number: [id number] Account Number: [id number] of Birth/Sex: Treating RN: 3Nov 09, 1967(56y.o. MErie NoePrimary Care Provider: Other Clinician: PKathlene NovemberReferring Provider: Treating Provider/Extender: HFreddi StarrWeeks in Treatment: 23 Subjective  Chief Complaint Information obtained from Patient Back wound History of Present Illness (HPI) Admission 03/28/2021 [name] is a 56 year old male with a past medical history of controlled type 2 diabetes on oral agents, obesity and OSA that presents to the clinic for a back wound. On 01/11/2021 patient had a laminectomy with PLIF of L1-S1 by Dr. Venetia Constable because of lumbar stenosis and radiculopathy. He subsequently developed bacteremia. He had CT imaging on 10/13 of the lumbar spine that showed fluid collection in the soft tissue of the posterior L1 and S1 and was taken to the OR for washout on 10/14. 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 He received 4 weeks of IV antibiotics by infectious disease. After his completion of 4 weeks of IV antibiotics he was continued for an additional 4 weeks of IV cefazolin with a stop date of 12/29. He has been evaluated by plastic surgery and no plans for surgical intervention at this time. Wife is present and reports he has been on the wound VAC for the past 8 weeks with improvement in wound healing. He currently denies systemic signs of infection. 12/22; patient presents for follow-up. He reports no issues since last clinic visit. He denies signs of infection. He has been tolerating the wound VAC well. 12/30; patient presents for follow-up. He reports no issues and has no complaints today. He has been tolerating the wound VAC well. 1/9;  patient presents for follow-up. He has no issues or complaints today. He states he feels well. He has had no problems with the wound VAC. 1/16; patient presents for follow-up. He continues to use the wound VAC with no issues. He denies signs of infection. 1/23; patient presents for follow-up. He has been switched from IV cefazolin to oral cefadroxil by infectious disease. He has no issues or complaints today. He denies signs of infection. He continues to tolerate the wound VAC well. 1/30; patient presents for follow-up. He continues to tolerate the wound VAC well. 2/6; patient presents for follow-up. He has no issues or complaints today. He continues to tolerate the wound VAC well. He denies signs of infection. 2/13; patient presents for follow-up. He continues to do well with the wound VAC. He denies any issues. 2/27; patient presents for follow-up. He continues to use the wound VAC without any issues. He denies signs of infection. 3/20; patient presents for follow-up. He has no issues or complaints today. He continues to use the wound VAC. 4/3; patient presents for follow-up. He continues to use the wound VAC without issues. He denies signs of infection. 4/17; 2-week follow-up. He continues to do well. His measurements are improved. Initially a surgical wound complicated by infection. 5/1; patient presents for follow-up. He has no issues or complaints today. He continues to tolerate the wound VAC well. He denies signs of infection. 5/15; patient presents for follow-up. He has noted more maceration to the periwound. He has been using the wound VAC without issues. He currently denies signs of infection. 5/30; patient presents for follow-up. He has been tolerating the wound VAC well over the past 2 weeks. He no longer has maceration to the periwound. He has no issues or complaints today. Patient History Information obtained from Patient. Family History Diabetes - Mother, Stroke - Siblings, No  family history of Cancer, Heart Disease, Hereditary Spherocytosis, Hypertension, Kidney Disease, Lung Disease, Seizures, Thyroid Problems, Tuberculosis. Social History Never smoker, Marital Status - Married, Alcohol Use - Rarely, Drug Use - No History, Caffeine  Use - Rarely. Medical History Cardiovascular Patient has history of Hypertension Endocrine Patient has history of Type II Diabetes Musculoskeletal Patient has history of Osteomyelitis Medical A Surgical History Notes nd Musculoskeletal DDD Objective Constitutional respirations regular, non-labored and within target range for patient.. Vitals Time Taken: 10:15 AM, Temperature: 98 F, Pulse: 105 bpm, Respiratory Rate: 17 breaths/min, Blood Pressure: 133/78 mmHg. Psychiatric pleasant and cooperative. General Notes: Lumbar spine: T the previous incision site there is an open wound with granulation tissue throughout and tunneling at the 12 o'clock position. No o signs of surrounding infection. Appears well-healing. Integumentary (Hair, Skin) Wound #1 status is Open. Original cause of wound was Surgical Injury. The date acquired was: 01/11/2021. The wound has been in treatment 23 weeks. The wound is located on the Midline Back. The wound measures 8cm length x 1.3cm width x 1cm depth; 8.168cm^2 area and 8.168cm^3 volume. There is Fat Layer (Subcutaneous Tissue) exposed. There is no undermining noted, however, there is tunneling at 12:00 with a maximum distance of 2cm. There is a medium amount of serosanguineous drainage noted. The wound margin is distinct with the outline attached to the wound base. There is large (67-100%) red, pink granulation within the wound bed. There is a small (1-33%) amount of necrotic tissue within the wound bed including Adherent Slough. 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 No signs of surrounding infection. At this time I recommended adding collagen under the wound VAC to see if this will help stimulate further granulation tissue and healing. He will continue nurse visits with [name] for [facility] change and we will see him next week for provider visit. Plan Follow-up Appointments: Return Appointment in 2 weeks. - w/ Dr. Heber [place] Dellia Nims) and [place] Room # 9 on Thursday 09/19/21 Nurse Visit: - on Thursday 09/12/21, Monday 09/16/21 w/ [place] Room # 9 Bathing/ Shower/ Hygiene: May shower with protection but do not get wound dressing(s) wet. Negative Presssure Wound Therapy: Wound Vac to wound continuously at 159m/hg pressure Black Foam - wound base then bridge to right side White Foam - T tunnel at 12:00 o Additional Orders / Instructions: Follow Nutritious Diet - Continue to monitor blood sugars daily WOUND #1: - Back Wound Laterality: Midline Prim Dressing: Promogran Prisma Matrix, 4.34 (sq in) (silver collagen) ary Discharge Instructions: Moisten collagen with saline or hydrogel and pack into tunnel @ 12 1. Collagen under the wound VAC 2. Nurse visits to change wound VAC 3. Follow-up for provider visit next week Electronic Signature(s) Signed: 09/10/2021 11:00:37 AM By: HKalman ShanDO Entered By: HKalman Shanon 09/10/2021 10:59:52 -------------------------------------------------------------------------------- HxROS Details Patient Name: Date of Service: [name] 09/10/2021 10:00 A M Medical Record Number: [id number] Account Number: [id number] of Birth/Sex: Treating RN: 310/14/1967(56y.o. MBurnadette Pop [name] Primary Care Provider: PKathlene NovemberOther Clinician: Referring Provider: Treating Provider/Extender: HFreddi StarrWeeks in Treatment: 23 Information Obtained From Patient Cardiovascular Medical History: Positive for:  Hypertension Endocrine Medical History: Positive for: Type II Diabetes Time with diabetes: Since mid 90's Treated with: Oral agents Blood sugar tested every day: Yes Tested : 2x day Musculoskeletal Medical History: Positive for: Osteomyelitis Past Medical History Notes: DDD Immunizations Pneumococcal Vaccine: Received Pneumococcal Vaccination: Yes Received Pneumococcal Vaccination On or After 60th Birthday: No Implantable Devices Yes Family and Social History Cancer: No; Diabetes:  Yes - Mother; Heart Disease: No; Hereditary Spherocytosis: No; Hypertension: No; Kidney Disease: No; Lung Disease: No; Seizures: No; Stroke: Yes - Siblings; Thyroid Problems: No; Tuberculosis: No; Never smoker; Marital Status - Married; Alcohol Use: Rarely; Drug Use: No History; Caffeine Use: Rarely; Financial Concerns: No; Food, Clothing or Shelter Needs: No; Support System Lacking: No; Transportation Concerns: No Electronic Signature(s) Signed: 09/10/2021 11:00:37 AM By: Kalman Shan DO Signed: 09/11/2021 3:17:18 PM By: [name] RN Entered By: Kalman Shan on 09/10/2021 10:55:57 -------------------------------------------------------------------------------- SuperBill Details Patient Name: Date of Service: [name] 09/10/2021 Medical Record Number: [id number] Patient Account Number: [id number] Date of Birth/Sex: Treating RN: 08/09/1965 (56 y.o. 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 M86.9 Osteomyelitis, unspecified E11.622 Type 2 diabetes mellitus with other skin ulcer Facility Procedures CPT4 Code: [id number] Description: 41740 - WOUND VAC-50 SQ CM OR LESS Modifier: Quantity: 1 Physician Procedures : CPT4 Code Description  Modifier [id number] - WC PHYS LEVEL 3 - EST PT ICD-10 Diagnosis Description L98.424 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

## 2021-09-11 NOTE — Progress Notes (Signed)
TWAN, HARKIN ([id number]) Visit Report for 09/10/2021 Arrival Information Details Patient Name: Date of Service: [name], [name] 09/10/2021 10:00 A M Medical Record Number: [id number] Patient Account Number: [id number] Date of Birth/Sex: Treating RN: Mar 29, 1966 (56 y.o. [name], [name] Primary Care Ladamien Rammel: [name] Other Clinician: Referring Azora Bonzo: Treating Vermell Madrid/Extender: [name] in Treatment: 23 Visit Information History Since Last Visit Added or deleted any medications: No Patient Arrived: Ambulatory Any new allergies or adverse reactions: No Arrival Time: 10:12 Had a fall or experienced change in No Accompanied By: friend activities of daily living that may affect Transfer Assistance: None risk of falls: Patient Identification Verified: Yes Signs or symptoms of abuse/neglect since last visito No Secondary Verification Process Completed: Yes Hospitalized since last visit: No Patient Requires Transmission-Based Precautions: No Implantable device outside of the clinic excluding No Patient Has Alerts: Yes cellular tissue based products placed in the center Patient Alerts: Patient on Blood Thinner since last visit: No BP Right Arm-PICC Has Dressing in Place as Prescribed: Yes Pain Present Now: No Electronic Signature(s) Signed: 09/11/2021 3:17:18 PM By: [name] RN Entered By: [name] on 09/10/2021 10:13:19 -------------------------------------------------------------------------------- Encounter Discharge Information Details Patient Name: Date of Service: Christian Brown E. 09/10/2021 10:00 A M Medical Record Number: [id number] Patient Account Number: [id number] Date of Birth/Sex: Treating RN: 11/17/1965 (56 y.o. [name], [name] Primary Care Hadyn Azer: [name] Other Clinician: Referring Broderick Fonseca: Treating Miyonna Ormiston/Extender: [name] Weeks in Treatment: 23 Encounter Discharge Information  Items Discharge Condition: Stable Ambulatory Status: Ambulatory Discharge Destination: Home Transportation: Private Auto Accompanied By: friend Schedule Follow-up Appointment: Yes Clinical Summary of Care: Patient Declined Electronic Signature(s) Signed: 09/11/2021 3:17:18 PM By: [name] RN Entered By: [name] on 09/10/2021 10:33:29 -------------------------------------------------------------------------------- Lower Extremity Assessment Details Patient Name: Date of Service: Christian Brown E. 09/10/2021 10:00 A M Medical Record Number: [id number] Patient Account Number: [id number] Date of Birth/Sex: Treating RN: 1965-04-19 (56 y.o. [name] Primary Care Normon Pettijohn: [name] Other Clinician: Referring Kayshaun Polanco: Treating Keayra Graham/Extender: [name] Weeks in Treatment: 23 Electronic Signature(s) Signed: 09/11/2021 3:17:18 PM By: [name] RN Entered By: [name] on 09/10/2021 10:13:59 -------------------------------------------------------------------------------- Multi Wound Chart Details Patient Name: Date of Service: Christian Brown E. 09/10/2021 10:00 A M Medical Record Number: [id number] Patient Account Number: [id number] Date of Birth/Sex: Treating RN: February 10, 1966 (56 y.o. [name], [name] Primary Care Yong Wahlquist: [name] Other Clinician: Referring Shakur Lembo: Treating Brayant Dorr/Extender: [name] Weeks in Treatment: 23 Vital Signs Height(in): Pulse(bpm): 105 Weight(lbs): Blood Pressure(mmHg): 133/78 Body Mass Index(BMI): Temperature(F): 98 Respiratory Rate(breaths/min): 17 Photos: [N/A:N/A] Midline Back N/A N/A Wound Location: Surgical Injury N/A N/A Wounding Event: Open Surgical Wound N/A N/A Primary Etiology: Hypertension, Type II Diabetes, N/A N/A Comorbid History: Osteomyelitis 01/11/2021 N/A N/A Date Acquired: 23 N/A N/A Weeks of Treatment: Open N/A N/A Wound  Status: No N/A N/A Wound Recurrence: 8x1.3x1 N/A N/A Measurements L x W x D (cm) 8.168 N/A N/A A (cm) : rea 8.168 N/A N/A Volume (cm) : 89.20% N/A N/A % Reduction in A rea: 95.30% N/A N/A % Reduction in Volume: 12 Position 1 (o'clock): 2 Maximum Distance 1 (cm): Yes N/A N/A Tunneling: Full Thickness With Exposed Support N/A N/A Classification: Structures Medium N/A N/A Exudate Amount: Serosanguineous N/A N/A Exudate Type: red, brown N/A N/A Exudate Color: Distinct, outline attached N/A N/A Wound Margin: Large (67-100%) N/A N/A Granulation Amount: Red, Pink N/A N/A Granulation Quality: Small (1-33%) N/A N/A  Necrotic Amount: Fat Layer (Subcutaneous Tissue): Yes N/A N/A Exposed Structures: Fascia: No Tendon: No Muscle: No Joint: No Bone: No Medium (34-66%) N/A N/A Epithelialization: Negative Pressure Wound Therapy N/A N/A Procedures Performed: Maintenance (NPWT) Treatment Notes Wound #1 (Back) Wound Laterality: Midline Cleanser Peri-Wound Care Topical Primary Dressing Secondary Dressing Secured With Compression Wrap Compression Stockings Add-Ons Electronic Signature(s) Signed: 09/10/2021 11:00:37 AM By: Kalman Shan DO Signed: 09/11/2021 3:17:18 PM By: [name] RN Entered By: Kalman Shan on 09/10/2021 10:54:15 -------------------------------------------------------------------------------- Multi-Disciplinary Care Plan Details Patient Name: Date of Service: Christian Brown E. 09/10/2021 10:00 A M Medical Record Number: [id number] Patient Account Number: [id number] Date of Birth/Sex: Treating RN: February 19, 1966 (56 y.o. [name], [name] Primary Care Shameria Trimarco: [name] Other Clinician: Referring Deontre Allsup: Treating Dilyn Osoria/Extender: [name] Weeks in Treatment: [place] reviewed with physician Active Inactive Wound/Skin Impairment Nursing Diagnoses: Impaired tissue  integrity Goals: Patient/caregiver will verbalize understanding of skin care regimen Date Initiated: 03/28/2021 Target Resolution Date: 09/14/2021 Goal Status: Active Ulcer/skin breakdown will have a volume reduction of 30% by week 4 Date Initiated: 03/28/2021 Date Inactivated: 05/27/2021 Target Resolution Date: 05/31/2021 Goal Status: Met Interventions: Assess patient/caregiver ability to obtain necessary supplies Assess patient/caregiver ability to perform ulcer/skin care regimen upon admission and as needed Assess ulceration(s) every visit Provide education on ulcer and skin care Treatment Activities: Topical wound management initiated : 03/28/2021 Notes: Electronic Signature(s) Signed: 09/11/2021 3:17:18 PM By: [name] RN Entered By: [name] on 09/10/2021 10:43:39 -------------------------------------------------------------------------------- Negative Pressure Wound Therapy Maintenance (NPWT) Details Patient Name: Date of Service: [place], [place] 09/10/2021 10:00 A M Medical Record Number: [id number] Patient Account Number: [id number] Date of Birth/Sex: Treating RN: 13-Oct-1965 (56 y.o. [name], [name] Primary Care Jazzmin Newbold: [name] Other Clinician: Referring Gorgeous Newlun: Treating Luciann Gossett/Extender: [name] Weeks in Treatment: 23 NPWT Maintenance Performed for: Wound #1 Midline Back Performed By: [name], RN Type: VAC System Coverage Size (sq cm): 10.4 Pressure Type: Constant Pressure Setting: 125 mmHG Drain Type: None Primary Contact: Non-Adherent Sponge/Dressing Type: Combination : 1 white 1 black Date Initiated: 03/28/2021 Dressing Removed: Yes Quantity of Sponges/Gauze Removed: 1 white 1 black Canister Changed: Yes Canister Exudate Volume: 50 Dressing Reapplied: Yes Quantity of Sponges/Gauze Inserted: 1 white 1 black Respones T Treatment: o tolerates well Days On NPWT : 167 Post Procedure Diagnosis Same as  Pre-procedure Electronic Signature(s) Signed: 09/11/2021 3:17:18 PM By: [name] RN Entered By: [name] on 09/10/2021 10:32:45 -------------------------------------------------------------------------------- Pain Assessment Details Patient Name: Date of Service: Christian Brown E. 09/10/2021 10:00 A M Medical Record Number: [id number] Patient Account Number: [id number] Date of Birth/Sex: Treating RN: [birth year] (55 y.o. [name] Primary Care Caleigha Zale: [name] Other Clinician: Referring Nehemias Sauceda: Treating Meleah Demeyer/Extender: [name] Weeks in Treatment: 23 Active Problems Location of Pain Severity and Description of Pain Patient Has Paino Yes Site Locations Pain Location: Generalized Pain, Pain in Ulcers With Dressing Change: Yes Duration of the Pain. Constant / Intermittento Constant Rate the pain. Current Pain Level: 5 Worst Pain Level: 10 Least Pain Level: 0 Tolerable Pain Level: 5 Character of Pain Describe the Pain: Aching Pain Management and Medication Current Pain Management: Medication: No Cold Application: No Rest: No Massage: No Activity: No T.MurphyN.S.: No Heat Application: No Leg drop or elevation: No Is the Current Pain Management Adequate: Adequate How does your wound impact your activities of daily livingo Sleep: No Bathing: No Appetite: No Relationship With Others: No Bladder Continence: No  Emotions: No Bowel Continence: No Work: No Toileting: No Drive: No Dressing: No Hobbies: No Electronic Signature(s) Signed: 09/11/2021 3:17:18 PM By: [name] RN Entered By: [name] on 09/10/2021 10:13:47 -------------------------------------------------------------------------------- Patient/Caregiver Education Details Patient Name: Date of Service: [name] 5/30/2023andnbsp10:00 A M Medical Record Number: [id number] Patient Account Number: [id number] Date of Birth/Gender: Treating  RN: [birth year] (56 y.o. [name] Primary Care Physician: [name] Other Clinician: Referring Physician: Treating Physician/Extender: [name] in Treatment: 23 Education Assessment Education Provided To: Patient Education Topics Provided Wound/Skin Impairment: Methods: Explain/Verbal Responses: Reinforcements needed, State content correctly [employer]) Signed: 09/11/2021 3:17:18 PM By: [name] RN Entered By: [name] on 09/10/2021 10:17:42 -------------------------------------------------------------------------------- Wound Assessment Details Patient Name: Date of Service: Christian Grout. 09/10/2021 10:00 A M Medical Record Number: [id number] Patient Account Number: [id number] Date of Birth/Sex: Treating RN: [birth year] (56 y.o. [name], [name] Primary Care Tjuana Vickrey: [name] Other Clinician: Referring Virat Prather: Treating Locklyn Henriquez/Extender: [name] Weeks in Treatment: 23 Wound Status Wound Number: 1 Primary Etiology: Open Surgical Wound Wound Location: Midline Back Wound Status: Open Wounding Event: Surgical Injury Comorbid History: Hypertension, Type II Diabetes, Osteomyelitis Date Acquired: 01/11/2021 Weeks Of Treatment: 23 Clustered Wound: No Photos Wound Measurements Length: (cm) 8 Width: (cm) 1.3 Depth: (cm) 1 Area: (cm) 8.168 Volume: (cm) 8.168 % Reduction in Area: 89.2% % Reduction in Volume: 95.3% Epithelialization: Medium (34-66%) Tunneling: Yes Position (o'clock): 12 Maximum Distance: (cm) 2 Undermining: No Wound Description Classification: Full Thickness With Exposed Support Struct Wound Margin: Distinct, outline attached Exudate Amount: Medium Exudate Type: Serosanguineous Exudate Color: red, brown ures Wound Bed Granulation Amount: Large (67-100%) Exposed Structure Granulation Quality: Red, Pink Fascia Exposed: No Necrotic Amount: Small (1-33%) Fat  Layer (Subcutaneous Tissue) Exposed: Yes Necrotic Quality: Adherent Slough Tendon Exposed: No Muscle Exposed: No Joint Exposed: No Bone Exposed: No Treatment Notes Wound #1 (Back) Wound Laterality: Midline Cleanser Peri-Wound Care Topical Primary Dressing Secondary Dressing Secured With Compression Wrap Compression Stockings Add-Ons Electronic Signature(s) Signed: 09/11/2021 3:17:18 PM By: [name] RN Entered By: [name] on 09/10/2021 10:39:35 -------------------------------------------------------------------------------- Vitals Details Patient Name: Date of Service: Christian Brown E. 09/10/2021 10:00 A M Medical Record Number: [id number] Patient Account Number: [id number] Date of Birth/Sex: Treating RN: 1966/02/16 (56 y.o. [name] Primary Care Merlyn Bollen: [name] Other Clinician: Referring Junior Huezo: Treating Kadence Mikkelson/Extender: [name] Weeks in Treatment: 23 Vital Signs Time Taken: 10:15 Temperature (F): 98 Pulse (bpm): 105 Respiratory Rate (breaths/min): 17 Blood Pressure (mmHg): 133/78 Reference Range: 80 - 120 mg / dl Electronic Signature(s) Signed: 09/11/2021 3:17:18 PM By: [name] RN Entered By: [name] on 09/10/2021 10:15:44

## 2021-09-12 ENCOUNTER — Encounter (HOSPITAL_BASED_OUTPATIENT_CLINIC_OR_DEPARTMENT_OTHER): Payer: No Typology Code available for payment source | Attending: Internal Medicine | Admitting: Internal Medicine

## 2021-09-12 DIAGNOSIS — E1169 Type 2 diabetes mellitus with other specified complication: Secondary | ICD-10-CM | POA: Insufficient documentation

## 2021-09-12 DIAGNOSIS — L98424 Non-pressure chronic ulcer of back with necrosis of bone: Secondary | ICD-10-CM | POA: Diagnosis not present

## 2021-09-12 DIAGNOSIS — T8130XA Disruption of wound, unspecified, initial encounter: Secondary | ICD-10-CM | POA: Insufficient documentation

## 2021-09-12 DIAGNOSIS — Y838 Other surgical procedures as the cause of abnormal reaction of the patient, or of later complication, without mention of misadventure at the time of the procedure: Secondary | ICD-10-CM | POA: Diagnosis not present

## 2021-09-12 DIAGNOSIS — M869 Osteomyelitis, unspecified: Secondary | ICD-10-CM | POA: Insufficient documentation

## 2021-09-12 DIAGNOSIS — T8140XA Infection following a procedure, unspecified, initial encounter: Secondary | ICD-10-CM | POA: Insufficient documentation

## 2021-09-12 DIAGNOSIS — E11622 Type 2 diabetes mellitus with other skin ulcer: Secondary | ICD-10-CM | POA: Insufficient documentation

## 2021-09-13 NOTE — Progress Notes (Signed)
TRAPPER, MEECH ([id number]) Visit Report for 09/12/2021 Arrival Information Details Patient Name: Date of Service: NIMA, KEMPPAINEN 09/12/2021 1:00 PM Medical Record Number: [id number] Patient Account Number: [id number] Date of Birth/Sex: Treating RN: 1966/03/29 (56 y.o. [name], [name] Primary Care Sivan Quast: [name] Other Clinician: Referring Tylin Stradley: Treating Cesilia Shinn/Extender: [name] Weeks in Treatment: 24 Visit Information History Since Last Visit Added or deleted any medications: No Patient Arrived: Ambulatory Any new allergies or adverse reactions: No Arrival Time: 13:28 Had a fall or experienced change in No Accompanied By: friend activities of daily living that may affect Transfer Assistance: None risk of falls: Patient Identification Verified: Yes Signs or symptoms of abuse/neglect since last visito No Secondary Verification Process Completed: Yes Hospitalized since last visit: No Patient Requires Transmission-Based Precautions: No Implantable device outside of the clinic excluding No Patient Has Alerts: Yes cellular tissue based products placed in the center Patient Alerts: Patient on Blood Thinner since last visit: No BP Right Arm-PICC Has Dressing in Place as Prescribed: Yes Pain Present Now: Yes Electronic Signature(s) Signed: 09/13/2021 11:55:08 AM By: [name] RN Entered By: [name] on 09/12/2021 13:28:43 -------------------------------------------------------------------------------- Encounter Discharge Information Details Patient Name: Date of Service: [name] 09/12/2021 1:00 PM Medical Record Number: [id number] Patient Account Number: [id number] Date of Birth/Sex: Treating RN: 07-13-1965 (56 y.o. [name], [name] Primary Care Javonta Gronau: [name] Other Clinician: Referring Katalyn Matin: Treating Gurvir Schrom/Extender: [name] Weeks in Treatment: 24 Encounter Discharge Information Items Discharge  Condition: Stable Ambulatory Status: Ambulatory Discharge Destination: Home Transportation: Private Auto Accompanied By: friend Schedule Follow-up Appointment: Yes Clinical Summary of Care: Patient Declined Electronic Signature(s) Signed: 09/13/2021 11:55:08 AM By: [name] RN Entered By: [name] on 09/12/2021 13:30:08 -------------------------------------------------------------------------------- Negative Pressure Wound Therapy Maintenance (NPWT) Details Patient Name: Date of Service: VERDIS, KOVAL 09/12/2021 1:00 PM Medical Record Number: [id number] Patient Account Number: [id number] Date of Birth/Sex: Treating RN: March 08, 1966 (56 y.o. [name], [name] Primary Care Noretta Frier: [name] Other Clinician: Referring Edgar Reisz: Treating Jentzen Minasyan/Extender: [name] Weeks in Treatment: 24 NPWT Maintenance Performed for: Wound #1 Midline Back Performed By: [name], RN Type: VAC System Coverage Size (sq cm): 10.4 Pressure Setting: 125 mmHG Drain Type: None Primary Contact: Non-Adherent Sponge/Dressing Type: Combination : 1 white 1 black Date Initiated: 03/28/2021 Dressing Removed: Yes Quantity of Sponges/Gauze Removed: 1 white 1 black Canister Changed: Yes Canister Exudate Volume: 50 Dressing Reapplied: Yes Quantity of Sponges/Gauze Inserted: 1 white 1 black Respones T Treatment: o tolerates well Days On NPWT : 169 Electronic Signature(s) Signed: 09/13/2021 11:55:08 AM By: [name] RN Entered By: [name] on 09/12/2021 13:29:37 -------------------------------------------------------------------------------- Patient/Caregiver Education Details Patient Name: Date of Service: [name] 6/1/2023andnbsp1:00 PM Medical Record Number: [id number] Patient Account Number: [id number] Date of Birth/Gender: Treating RN: 1965-06-13 (56 y.o. [name] Primary Care Physician: [name] Other  Clinician: Referring Physician: Treating Physician/Extender: [name] in Treatment: 24 Education Assessment Education Provided To: Patient Education Topics Provided Wound/Skin Impairment: Methods: Explain/Verbal Responses: Reinforcements needed, State content correctly [employer]) Signed: 09/13/2021 11:55:08 AM By: [name] RN Entered By: [name] on 09/12/2021 13:29:57 -------------------------------------------------------------------------------- Wound Assessment Details Patient Name: Date of Service: [name] 09/12/2021 1:00 PM Medical Record Number: [id number] Patient Account Number: [id number] Date of Birth/Sex: Treating RN: [birth year] (56 y.o. [name], [name] Primary Care Edison Nicholson: [name] Other Clinician: Referring Lindsy Cerullo: Treating Witney Huie/Extender: [name] Weeks in  Treatment: 24 Wound Status Wound Number: 1 Primary Etiology: Open Surgical Wound Wound Location: Midline Back Wound Status: Open Wounding Event: Surgical Injury Date Acquired: 01/11/2021 Weeks Of Treatment: 24 Clustered Wound: No Wound Measurements Length: (cm) 8 Width: (cm) 1.3 Depth: (cm) 1 Area: (cm) 8.168 Volume: (cm) 8.168 % Reduction in Area: 89.2% % Reduction in Volume: 95.3% Wound Description Classification: Full Thickness With Exposed Support Structure Exudate Amount: Medium Exudate Type: Serosanguineous Exudate Color: red, brown s Treatment Notes Wound #1 (Back) Wound Laterality: Midline Cleanser Peri-Wound Care Topical Primary Dressing Promogran Prisma Matrix, 4.34 (sq in) (silver collagen) Discharge Instruction: Moisten collagen with saline or hydrogel and pack into tunnel @ 12 Secondary Dressing Secured With Compression Wrap Compression Stockings Add-Ons Electronic Signature(s) Signed: 09/13/2021 11:55:08 AM By: [name] RN Entered By: [name] on 09/12/2021  13:29:06 -------------------------------------------------------------------------------- Vitals Details Patient Name: Date of Service: [name] 09/12/2021 1:00 PM Medical Record Number: [id number] Patient Account Number: [id number] Date of Birth/Sex: Treating RN: Jun 25, 1965 (56 y.o. [name] Primary Care Basem Yannuzzi: [name] Other Clinician: Referring Mckaila Duffus: Treating Mccabe Gloria/Extender: [name] Weeks in Treatment: 24 Vital Signs Time Taken: 13:28 Temperature (F): 98.7 Pulse (bpm): 107 Respiratory Rate (breaths/min): 17 Blood Pressure (mmHg): 133/74 Reference Range: 80 - 120 mg / dl Electronic Signature(s) Signed: 09/13/2021 11:55:08 AM By: [name] RN Entered By: [name] on 09/12/2021 13:28:59

## 2021-09-13 NOTE — Progress Notes (Signed)
SAYEED, WEATHERALL ([id number]) Visit Report for 09/12/2021 SuperBill Details Patient Name: Date of Service: [name], [name] 09/12/2021 Medical Record Number: [id number] Patient Account Number: [id number] Date of Birth/Sex: Treating RN: 06-18-1965 (56 y.o. 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

## 2021-09-16 ENCOUNTER — Encounter (HOSPITAL_BASED_OUTPATIENT_CLINIC_OR_DEPARTMENT_OTHER): Payer: No Typology Code available for payment source | Admitting: General Surgery

## 2021-09-16 ENCOUNTER — Other Ambulatory Visit: Payer: Self-pay | Admitting: Physical Medicine and Rehabilitation

## 2021-09-16 DIAGNOSIS — T8140XA Infection following a procedure, unspecified, initial encounter: Secondary | ICD-10-CM | POA: Diagnosis not present

## 2021-09-16 NOTE — Progress Notes (Signed)
DMAURI, ROSENOW ([id number]) Visit Report for 09/16/2021 SuperBill Details Patient Name: Date of Service: [name], [name] 09/16/2021 Medical Record Number: [id number] Patient Account Number: [id number] Date of Birth/Sex: Treating RN: 10/29/1965 (56 y.o. 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

## 2021-09-16 NOTE — Progress Notes (Signed)
UGO, THOMA ([id number]) Visit Report for 09/16/2021 Arrival Information Details Patient Name: Date of Service: [name], [name] 09/16/2021 9:45 A M Medical Record Number: [id number] Patient Account Number: [id number] Date of Birth/Sex: Treating RN: 1965/10/06 (56 y.o. [name] Primary Care Khyree Carillo: [name] Other Clinician: Referring Chavez Rosol: Treating Ilia Dimaano/Extender: Heloise Beecham in Treatment: 24 Visit Information History Since Last Visit Added or deleted any medications: No Patient Arrived: Ambulatory Any new allergies or adverse reactions: No Arrival Time: 09:50 Had a fall or experienced change in No Accompanied By: family member activities of daily living that may affect Transfer Assistance: None risk of falls: Patient Identification Verified: Yes Signs or symptoms of abuse/neglect since last visito No Secondary Verification Process Completed: Yes Hospitalized since last visit: No Patient Requires Transmission-Based Precautions: No Implantable device outside of the clinic excluding No Patient Has Alerts: Yes cellular tissue based products placed in the center Patient Alerts: Patient on Blood Thinner since last visit: No BP Right Arm-PICC Has Dressing in Place as Prescribed: Yes Pain Present Now: No Electronic Signature(s) Signed: 09/16/2021 3:50:54 PM By: Deon Pilling RN, BSN Entered By: Deon Pilling on 09/16/2021 10:31:20 -------------------------------------------------------------------------------- Encounter Discharge Information Details Patient Name: Date of Service: [name]. 09/16/2021 9:45 A M Medical Record Number: [id number] Patient Account Number: [id number] Date of Birth/Sex: Treating RN: Nov 03, 1965 (56 y.o. [name] Primary Care Mitul Hallowell: [name] Other Clinician: Referring Zakari Couchman: Treating Ruhaan Nordahl/Extender: Heloise Beecham in Treatment: 24 Encounter Discharge Information Items Discharge  Condition: Stable Ambulatory Status: Ambulatory Discharge Destination: Home Transportation: Private Auto Accompanied By: self Schedule Follow-up Appointment: Yes Clinical Summary of Care: Electronic Signature(s) Signed: 09/16/2021 3:50:54 PM By: Deon Pilling RN, BSN Entered By: Deon Pilling on 09/16/2021 10:52:36 -------------------------------------------------------------------------------- Negative Pressure Wound Therapy Maintenance (NPWT) Details Patient Name: Date of Service: [name], [name] 09/16/2021 9:45 A M Medical Record Number: [id number] Patient Account Number: [id number] Date of Birth/Sex: Treating RN: 06/23/1965 (56 y.o. [name] Primary Care Tavien [name]: [name] Other Clinician: Referring Lauraann Missey: Treating Toben Acuna/Extender: Otelia Sergeant Weeks in Treatment: 24 NPWT Maintenance Performed for: Wound #1 Midline Back Performed By: Deon Pilling, RN Type: VAC System Coverage Size (sq cm): 10.4 Pressure Type: Constant Pressure Setting: 125 mmHG Drain Type: None Primary Contact: Non-Adherent Sponge/Dressing Type: Combination : x2 black foam and x1 white foam Date Initiated: 03/28/2021 Dressing Removed: Yes Quantity of Sponges/Gauze Removed: x1 black foam and x1 white foam Canister Changed: Yes Canister Exudate Volume: 50 Dressing Reapplied: Yes Quantity of Sponges/Gauze Inserted: x2 black foam and x1 white foam bridge to right Days On NPWT : 173 Electronic Signature(s) Signed: 09/16/2021 3:50:54 PM By: Deon Pilling RN, BSN Entered By: Deon Pilling on 09/16/2021 10:37:32 -------------------------------------------------------------------------------- Patient/Caregiver Education Details Patient Name: Date of Service: [name] 6/5/2023andnbsp9:45 [place] Record Number: [id number] Patient Account Number: [id number] Date of Birth/Gender: Treating RN: 1965-08-07 (56 y.o. [name] Primary Care Physician: [name] Other  Clinician: Referring Physician: Treating Physician/Extender: Heloise Beecham in Treatment: 24 Education Assessment Education Provided To: Patient Education Topics Provided Wound/Skin Impairment: Handouts: Skin Care Do's and Dont's Methods: Explain/Verbal Responses: Reinforcements needed Electronic Signature(s) Signed: 09/16/2021 3:50:54 PM By: Deon Pilling RN, BSN Entered By: Deon Pilling on 09/16/2021 10:52:26 -------------------------------------------------------------------------------- Wound Assessment Details Patient Name: Date of Service: [name]. 09/16/2021 9:45 A M Medical Record Number: [id number] Patient Account Number: [id number] Date of Birth/Sex: Treating RN: [birth year] (56 y.o.  [name] Primary Care Telitha Plath: [name] Other Clinician: Referring Maryrose Colvin: Treating Ewart Carrera/Extender: Otelia Sergeant Weeks in Treatment: 24 Wound Status Wound Number: 1 Primary Etiology: Open Surgical Wound Wound Location: Midline Back Wound Status: Open Wounding Event: Surgical Injury Date Acquired: 01/11/2021 Weeks Of Treatment: 24 Clustered Wound: No Wound Measurements Length: (cm) 8 Width: (cm) 1.3 Depth: (cm) 1 Area: (cm) 8.168 Volume: (cm) 8.168 % Reduction in Area: 89.2% % Reduction in Volume: 95.3% Wound Description Classification: Full Thickness With Exposed Support Structures Exudate Amount: Medium Exudate Type: Serosanguineous Exudate Color: red, brown Treatment Notes Wound #1 (Back) Wound Laterality: Midline Cleanser Peri-Wound Care Topical Primary Dressing Promogran Prisma Matrix, 4.34 (sq in) (silver collagen) Discharge Instruction: Moisten collagen with saline or hydrogel and pack into tunnel @ 12 Secondary Dressing Secured With Compression Wrap Compression Stockings Add-Ons Electronic Signature(s) Signed: 09/16/2021 3:50:54 PM By: Deon Pilling RN, BSN Entered By: Deon Pilling on 09/16/2021 10:31:33

## 2021-09-19 ENCOUNTER — Encounter (HOSPITAL_BASED_OUTPATIENT_CLINIC_OR_DEPARTMENT_OTHER): Payer: No Typology Code available for payment source | Admitting: Internal Medicine

## 2021-09-19 ENCOUNTER — Other Ambulatory Visit (HOSPITAL_COMMUNITY): Payer: Self-pay

## 2021-09-19 ENCOUNTER — Other Ambulatory Visit: Payer: Self-pay | Admitting: Physical Medicine and Rehabilitation

## 2021-09-19 DIAGNOSIS — T8140XA Infection following a procedure, unspecified, initial encounter: Secondary | ICD-10-CM | POA: Diagnosis not present

## 2021-09-19 NOTE — Progress Notes (Signed)
[name] ([id number]) Visit Report for 09/19/2021 HPI Details Patient Name: Date of Service: [name] 09/19/2021 10:00 A M Medical Record Number: [id number] Patient Account Number: [id number] Date of Birth/Sex: Treating RN: February 09, 1966 (56 y.o. [name] Primary Care Provider: [name] Other Clinician: Referring Provider: Treating Provider/Extender: [name] in Treatment: 25 History of Present Illness HPI Description: Admission 03/28/2021 [name] is a 56 year old male with a past medical history of controlled type 2 diabetes on oral agents, obesity and OSA that presents to the clinic for a back wound. On 01/11/2021 patient had a laminectomy with PLIF of L1-S1 by Dr. Venetia Constable because of lumbar stenosis and radiculopathy. He subsequently developed bacteremia. He had CT imaging on 10/13 of the lumbar spine that showed fluid collection in the soft tissue of the posterior L1 and S1 and was taken to the OR for washout on 10/14. 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 He received 4 weeks of IV antibiotics by infectious disease. After his completion of 4 weeks of IV antibiotics he was continued for an additional 4 weeks of IV cefazolin with a stop date of 12/29. He has been evaluated by plastic surgery and no plans for surgical intervention at this time. Wife is present and reports he has been on the wound VAC for the past 8 weeks with improvement in wound healing. He currently denies systemic signs of infection. 12/22; patient presents for follow-up. He reports no issues since last clinic visit. He denies signs of infection. He has been tolerating the wound VAC well. 12/30; patient presents for follow-up. He reports no issues and has no complaints today. He has been tolerating the wound VAC well. 1/9; patient presents for follow-up. He has no issues or complaints today. He states he feels well. He has had no problems with  the wound VAC. 1/16; patient presents for follow-up. He continues to use the wound VAC with no issues. He denies signs of infection. 1/23; patient presents for follow-up. He has been switched from IV cefazolin to oral cefadroxil by infectious disease. He has no issues or complaints today. He denies signs of infection. He continues to tolerate the wound VAC well. 1/30; patient presents for follow-up. He continues to tolerate the wound VAC well. 2/6; patient presents for follow-up. He has no issues or complaints today. He continues to tolerate the wound VAC well. He denies signs of infection. 2/13; patient presents for follow-up. He continues to do well with the wound VAC. He denies any issues. 2/27; patient presents for follow-up. He continues to use the wound VAC without any issues. He denies signs of infection. 3/20; patient presents for follow-up. He has no issues or complaints today. He continues to use the wound VAC. 4/3; patient presents for follow-up. He continues to use the wound VAC without issues. He denies signs of infection. 4/17; 2-week follow-up. He continues to do well. His measurements are improved. Initially a surgical wound complicated by infection. 5/1; patient presents for follow-up. He has no issues or complaints today. He continues to tolerate the wound VAC well. He denies signs of infection. 5/15; patient presents for follow-up. He has noted more maceration to the periwound. He has been using the wound VAC without issues. He currently denies signs of infection. 5/30; patient presents for follow-up. He has been tolerating the wound VAC well over the past 2 weeks. He no longer has maceration to the periwound. He has no issues  or complaints today. 6/8; this patient with a postsurgical wound that was complicated by postop infection. He has been using silver collagen under wound VAC and gradually doing well improvement in dimensions especially the tunnel at 12:00 Electronic  Signature(s) Signed: 09/19/2021 4:23:09 PM By: Linton Ham MD Entered By: Linton Ham on 09/19/2021 11:06:09 -------------------------------------------------------------------------------- Physical Exam Details Patient Name: Date of Service: Christian Brown E. 09/19/2021 10:00 A M Medical Record Number: [id number] Patient Account Number: [id number] Date of Birth/Sex: Treating RN: 08-Mar-1966 (56 y.o. [name] Primary Care Provider: [name] Other Clinician: Referring Provider: Treating Provider/Extender: [name] in Treatment: 25 Constitutional Sitting or standing Blood Pressure is within target range for patient.. Pulse regular and within target range for patient.Marland Kitchen Respirations regular, non-labored and within target range.. Temperature is normal and within the target range for the patient.Marland Kitchen Appears in no distress. Notes Wound exam; lower lumbar spine. Wound is in the original incision site. The tunneling at 12:00 is much better. Tissue looks healthy. No surrounding skin infection is seen appears to be on its way to closure albeit very gradually Electronic Signature(s) Signed: 09/19/2021 4:23:09 PM By: Linton Ham MD Entered By: Linton Ham on 09/19/2021 11:07:13 -------------------------------------------------------------------------------- Physician Orders Details Patient Name: Date of Service: Christian Brown E. 09/19/2021 10:00 A M Medical Record Number: [id number] Patient Account Number: [id number] Date of Birth/Sex: Treating RN: [birth year] (56 y.o. [name] Primary Care Provider: [name] Other Clinician: Referring Provider: Treating Provider/Extender: [name] in Treatment: 25 Verbal / Phone Orders: No Diagnosis Coding Follow-up Appointments ppointment in 2 weeks. - w/ Dr. Heber Anthem and Nolan Room # 9 on Thursday 10/03/21 @ [place] Return A Nurse Visit: - *ALL @ 0915: MOnday 09/23/21, Thurs. 09/26/21,  Mon. 09/30/21 Bathing/ Shower/ Hygiene May shower with protection but do not get wound dressing(s) wet. Negative Presssure Wound Therapy Wound Vac to wound continuously at 114m/hg pressure Black Foam - wound base then bridge to right side White Foam - T tunnel at 12:00 o Additional Orders / Instructions Follow Nutritious Diet - Continue to monitor blood sugars daily Wound Treatment Wound #1 - Back Wound Laterality: Midline Prim Dressing: Promogran Prisma Matrix, 4.34 (sq in) (silver collagen) ary Discharge Instructions: Moisten collagen with saline or hydrogel and pack into tunnel @ 12 Electronic Signature(s) Signed: 09/19/2021 4:23:09 PM By: RLinton HamMD Signed: 09/19/2021 4:55:17 PM By: BRhae HammockRN Entered By: BRhae Hammockon 09/19/2021 10:45:50 -------------------------------------------------------------------------------- Problem List Details Patient Name: Date of Service: [name] 09/19/2021 10:00 A M Medical Record Number: [id number] Account Number: [id number] of Birth/Sex: Treating RN: 316-Jan-1967(56y.o. 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

## 2021-09-20 ENCOUNTER — Other Ambulatory Visit (HOSPITAL_COMMUNITY): Payer: Self-pay

## 2021-09-20 MED ORDER — MORPHINE SULFATE ER 30 MG PO TBCR
30.0000 mg | EXTENDED_RELEASE_TABLET | Freq: Two times a day (BID) | ORAL | 0 refills | Status: DC
  Filled 2021-09-20: qty 60, 30d supply, fill #0

## 2021-09-23 ENCOUNTER — Encounter (HOSPITAL_BASED_OUTPATIENT_CLINIC_OR_DEPARTMENT_OTHER): Payer: No Typology Code available for payment source | Admitting: Internal Medicine

## 2021-09-23 DIAGNOSIS — T8140XA Infection following a procedure, unspecified, initial encounter: Secondary | ICD-10-CM | POA: Diagnosis not present

## 2021-09-24 NOTE — Progress Notes (Signed)
[name] ([id number]) Visit Report for 09/02/2021 Arrival Information Details Patient Name: Date of Service: [name] 09/02/2021 10:00 A M Medical Record Number: [id number] Patient Account Number: [id number] Date of Birth/Sex: Treating RN: 10/10/1965 (56 y.o. [name], [name] Primary Care Resha Filippone: [name] Other Clinician: Referring Cyanne Delmar: Treating Tia Hieronymus/Extender: [name] Weeks in Treatment: 22 Visit Information History Since Last Visit Added or deleted any medications: No Patient Arrived: [name] Any new allergies or adverse reactions: No Arrival Time: 10:18 Had a fall or experienced change in No Accompanied By: friend activities of daily living that may affect Transfer Assistance: None risk of falls: Patient Requires Transmission-Based Precautions: No Signs or symptoms of abuse/neglect since last visito No Patient Has Alerts: Yes Hospitalized since last visit: No Patient Alerts: Patient on Blood Thinner Implantable device outside of the clinic excluding No No BP Right Arm-PICC cellular tissue based products placed in the center since last visit: Has Dressing in Place as Prescribed: Yes Pain Present Now: Yes Electronic Signature(s) Signed: 09/24/2021 8:46:54 AM By: Erenest Blank Entered By: Erenest Blank on 09/02/2021 10:19:25 -------------------------------------------------------------------------------- Encounter Discharge Information Details Patient Name: Date of Service: [name] E. 09/02/2021 10:00 A M Medical Record Number: [id number] Patient Account Number: [id number] Date of Birth/Sex: Treating RN: [birth year] (56 y.o. [name] Primary Care Jaymarion Trombly: [name] Other Clinician: Referring Contessa Preuss: Treating Elesia Pemberton/Extender: [name] Weeks in Treatment: 22 Encounter Discharge Information Items Discharge Condition: Stable Ambulatory Status: Cane Discharge Destination: Home Transportation:  Private Auto Accompanied By: [name] Schedule Follow-up Appointment: Yes Clinical Summary of Care: Patient Declined Electronic Signature(s) Signed: 09/02/2021 4:27:47 PM By: Lorrin Jackson Entered By: Lorrin Jackson on 09/02/2021 10:59:59 -------------------------------------------------------------------------------- Negative Pressure Wound Therapy Maintenance (NPWT) Details Patient Name: Date of Service: [name] 09/02/2021 10:00 A M Medical Record Number: [id number] Patient Account Number: [id number] Date of Birth/Sex: Treating RN: 08-11-1965 (56 y.o. [name] Primary Care Geno Sydnor: [name] Other Clinician: Referring [place] Will[place]: Treating Rusti Arizmendi/Extender: [name] Weeks in Treatment: 22 NPWT Maintenance Performed for: Wound #1 Midline Back Performed By: Lorrin Jackson, RN Type: VAC System Coverage Size (sq cm): 17.6 Pressure Type: Constant Pressure Setting: 125 mmHG Drain Type: None Sponge/Dressing Type: Combination : Date Initiated: 03/28/2021 Dressing Removed: Yes Canister Changed: Yes Dressing Reapplied: Yes Days On NPWT : 159 Electronic Signature(s) Signed: 09/02/2021 4:27:47 PM By: Lorrin Jackson Entered By: Lorrin Jackson on 09/02/2021 10:35:53 -------------------------------------------------------------------------------- Patient/Caregiver Education Details Patient Name: Date of Service: [name] 5/22/2023andnbsp10:00 [place] Record Number: [id number] Patient Account Number: [id number] Date of Birth/Gender: Treating RN: 1965/12/13 (56 y.o. [name] Primary Care Physician: [name] Other Clinician: Referring Physician: Treating Physician/Extender: [name] in Treatment: 22 Education Assessment Education Provided To: Patient Education Topics Provided Wound/Skin Impairment: Methods: Explain/Verbal Responses: State content correctly [employer]) Signed: 09/02/2021  4:27:47 PM By: Lorrin Jackson Entered By: Lorrin Jackson on 09/02/2021 10:59:41 -------------------------------------------------------------------------------- Wound Assessment Details Patient Name: Date of Service: [name] E. 09/02/2021 10:00 A M Medical Record Number: [id number] Patient Account Number: [id number] Date of Birth/Sex: Treating RN: 1965-10-11 (56 y.o. [name] Primary Care Kalai Baca: [name] Other Clinician: Referring Carlita Whitcomb: Treating Audel Coakley/Extender: [name] Weeks in Treatment: 22 Wound Status Wound Number: 1 Primary Etiology: Open Surgical Wound Wound Location: Midline Back Wound Status: Open Wounding Event: Surgical Injury Comorbid History: Hypertension, Type II Diabetes, Osteomyelitis Date Acquired: 01/11/2021 Weeks Of Treatment: 22 Clustered Wound: No  Wound Measurements Length: (cm) 8 Width: (cm) 2.2 Depth: (cm) 1 Area: (cm) 13.823 Volume: (cm) 13.823 % Reduction in Area: 81.7% % Reduction in Volume: 92% Epithelialization: Large (67-100%) Tunneling: Yes Position (o'clock): 12 Maximum Distance: (cm) 1.7 Undermining: No Wound Description Classification: Full Thickness With Exposed Support Structures Wound Margin: Distinct, outline attached Exudate Amount: Medium Exudate Type: Serosanguineous Exudate Color: red, Murphy Foul Odor After Cleansing: No Slough/Fibrino No Wound Bed Granulation Amount: Large (67-100%) Exposed Structure Granulation Quality: Red, Friable Fascia Exposed: No Fat Layer (Subcutaneous Tissue) Exposed: Yes Tendon Exposed: No Muscle Exposed: No Joint Exposed: No Bone Exposed: No Electronic Signature(s) Signed: 09/02/2021 4:27:47 PM By: Lorrin Jackson Entered By: Lorrin Jackson on 09/02/2021 10:37:28 -------------------------------------------------------------------------------- Vitals Details Patient Name: Date of Service: [name] E. 09/02/2021 10:00 A M Medical Record Number:  [id number] Patient Account Number: [id number] Date of Birth/Sex: Treating RN: 11-Jan-1966 (56 y.o. [name]: [name] Other Clinician: Referring Joleah Kosak: Treating Medea Deines/Extender: [name] Weeks in Treatment: 22 Vital Signs Time Taken: 10:21 Temperature (F): 97.6 Pulse (bpm): 108 Respiratory Rate (breaths/min): 18 Blood Pressure (mmHg): 111/72 Reference Range: 80 - 120 mg / dl Electronic Signature(s) Signed: 09/24/2021 8:46:54 AM By: Erenest Blank Entered By: Erenest Blank on 09/02/2021 10:22:23

## 2021-09-24 NOTE — Progress Notes (Signed)
NTHONY, Murphy ([id number]) Visit Report for 09/19/2021 Arrival Information Details Patient Name: Date of Service: [name], [name] 09/19/2021 10:00 A M Medical Record Number: [id number] Patient Account Number: [id number] Date of Birth/Sex: Treating RN: 12-Jun-1965 (56 y.o. [name], [name] Primary Care Greycen Felter: [name] Other Clinician: Referring Arkie Tagliaferro: Treating Arisa Congleton/Extender: [name] in Treatment: 25 Visit Information History Since Last Visit Added or deleted any medications: No Patient Arrived: Ambulatory Any new allergies or adverse reactions: No Arrival Time: 10:32 Had a fall or experienced change in No Accompanied By: friend activities of daily living that may affect Transfer Assistance: None risk of falls: Patient Identification Verified: Yes Signs or symptoms of abuse/neglect since last visito No Secondary Verification Process Completed: Yes Hospitalized since last visit: No Patient Requires Transmission-Based Precautions: No Implantable device outside of the clinic excluding No Patient Has Alerts: Yes cellular tissue based products placed in the center Patient Alerts: Patient on Blood Thinner since last visit: No BP Right Arm-PICC Has Dressing in Place as Prescribed: Yes Pain Present Now: No Electronic Signature(s) Signed: 09/24/2021 8:46:10 AM By: Erenest Blank Entered By: Erenest Blank on 09/19/2021 10:32:51 -------------------------------------------------------------------------------- Encounter Discharge Information Details Patient Name: Date of Service: Christian Brown E. 09/19/2021 10:00 A M Medical Record Number: [id number] Patient Account Number: [id number] Date of Birth/Sex: Treating RN: 1965-11-21 (56 y.o. [name] Primary Care Fendi Meinhardt: [name] Other Clinician: Referring Shawntell Dixson: Treating Renisha Cockrum/Extender: [name] in Treatment: 25 Encounter Discharge Information Items Discharge  Condition: Stable Ambulatory Status: Ambulatory Discharge Destination: Home Transportation: Private Auto Accompanied By: friend Schedule Follow-up Appointment: Yes Clinical Summary of Care: Patient Declined Electronic Signature(s) Signed: 09/19/2021 4:55:17 PM By: [name] RN Entered By: [name] on 09/19/2021 11:02:34 -------------------------------------------------------------------------------- Lower Extremity Assessment Details Patient Name: Date of Service: Christian Brown E. 09/19/2021 10:00 A M Medical Record Number: [id number] Patient Account Number: [id number] Date of Birth/Sex: Treating RN: [birth year] (56 y.o. [name] Primary Care Lari Linson: [name] Other Clinician: Referring Yuma Pacella: Treating Venora Kautzman/Extender: [name] in Treatment: 25 Electronic Signature(s) Signed: 09/19/2021 4:55:17 PM By: [name] RN Signed: 09/24/2021 8:46:10 AM By: Erenest Blank Entered By: Erenest Blank on 09/19/2021 10:33:39 -------------------------------------------------------------------------------- Multi Wound Chart Details Patient Name: Date of Service: Christian Brown E. 09/19/2021 10:00 A M Medical Record Number: [id number] Patient Account Number: [id number] Date of Birth/Sex: Treating RN: [birth year] (56 y.o. [name], [place] Primary Care Deejay Koppelman: [name] Other Clinician: Referring Haidynn Almendarez: Treating Daphne Karrer/Extender: [name] in Treatment: 25 Vital Signs Height(in): Pulse(bpm): 90 Weight(lbs): Blood Pressure(mmHg): 123/75 Body Mass Index(BMI): Temperature(F): 97.7 Respiratory Rate(breaths/min): 17 Photos: [N/A:N/A] Midline Back N/A N/A Wound Location: Surgical Injury N/A N/A Wounding Event: Open Surgical Wound N/A N/A Primary Etiology: Hypertension, Type II Diabetes, N/A N/A Comorbid History: Osteomyelitis 01/11/2021 N/A N/A Date Acquired: 25 N/A N/A Weeks of Treatment: Open  N/A N/A Wound Status: No N/A N/A Wound Recurrence: 7.1x1.7x0.1 N/A N/A Measurements L x W x D (cm) 9.48 N/A N/A A (cm) : rea 0.948 N/A N/A Volume (cm) : 87.50% N/A N/A % Reduction in A rea: 99.50% N/A N/A % Reduction in Volume: 12 Position 1 (o'clock): 1.5 Maximum Distance 1 (cm): Yes N/A N/A Tunneling: Full Thickness With Exposed Support N/A N/A Classification: Structures Medium N/A N/A Exudate Amount: Serosanguineous N/A N/A Exudate Type: red, brown N/A N/A Exudate Color: Large (67-100%) N/A N/A Granulation A mount: None Present (0%) N/A N/A Necrotic A mount: Small (1-33%)  N/A N/A Epithelialization: Negative Pressure Wound Therapy N/A N/A Procedures Performed: Maintenance (NPWT) Treatment Notes Wound #1 (Back) Wound Laterality: Midline Cleanser Peri-Wound Care Topical Primary Dressing Promogran Prisma Matrix, 4.34 (sq in) (silver collagen) Discharge Instruction: Moisten collagen with saline or hydrogel and pack into tunnel @ 12 Secondary Dressing Secured With Compression Wrap Compression Stockings Add-Ons Electronic Signature(s) Signed: 09/19/2021 4:23:09 PM By: Linton Ham MD Signed: 09/19/2021 4:55:17 PM By: [name] RN Entered By: Linton Ham on 09/19/2021 11:05:00 -------------------------------------------------------------------------------- Multi-Disciplinary Care Plan Details Patient Name: Date of Service: Christian Brown E. 09/19/2021 10:00 A M Medical Record Number: [id number] Patient Account Number: [id number] Date of Birth/Sex: Treating RN: Mar 22, 1966 (56 y.o. [name], [name] Primary Care Morissa Obeirne: [name] Other Clinician: Referring Maitri Schnoebelen: Treating Deneisha Dade/Extender: [name] in Treatment: 25 [place] reviewed with physician Active Inactive Wound/Skin Impairment Nursing Diagnoses: Impaired tissue integrity Goals: Patient/caregiver will verbalize understanding of skin  care regimen Date Initiated: 03/28/2021 Target Resolution Date: 10/12/2021 Goal Status: Active Ulcer/skin breakdown will have a volume reduction of 30% by week 4 Date Initiated: 03/28/2021 Date Inactivated: 05/27/2021 Target Resolution Date: 05/31/2021 Goal Status: Met Interventions: Assess patient/caregiver ability to obtain necessary supplies Assess patient/caregiver ability to perform ulcer/skin care regimen upon admission and as needed Assess ulceration(s) every visit Provide education on ulcer and skin care Treatment Activities: Topical wound management initiated : 03/28/2021 Notes: Electronic Signature(s) Signed: 09/19/2021 4:55:17 PM By: [name] RN Entered By: [name] on 09/19/2021 10:46:15 -------------------------------------------------------------------------------- Negative Pressure Wound Therapy Maintenance (NPWT) Details Patient Name: Date of Service: THADEUS, GANDOLFI 09/19/2021 10:00 A M Medical Record Number: [id number] Patient Account Number: [id number] Date of Birth/Sex: Treating RN: 27-Jul-1965 (56 y.o. [name], [name] Primary Care Tiondra Fang: [name] Other Clinician: Referring Kathren Scearce: Treating Vicie Cech/Extender: [name] in Treatment: 25 NPWT Maintenance Performed for: Wound #1 Midline Back Performed By: [name], RN Type: VAC System Coverage Size (sq cm): 12.07 Pressure Type: Constant Pressure Setting: 125 mmHG Drain Type: None Primary Contact: Non-Adherent Sponge/Dressing Type: Combination : 1 WHITE FOAM 1 BLACK FOAM Date Initiated: 03/28/2021 Dressing Removed: Yes Quantity of Sponges/Gauze Removed: 1 WHITE 1 BLACK Canister Changed: Yes Canister Exudate Volume: 50 Dressing Reapplied: Yes Quantity of Sponges/Gauze Inserted: 1 WHITE 1 BLACK Respones T Treatment: o TOLERATES WELL Days On NPWT : 176 Post Procedure Diagnosis Same as Pre-procedure Electronic Signature(s) Signed: 09/19/2021 4:55:17  PM By: [name] RN Entered By: [name] on 09/19/2021 10:48:11 -------------------------------------------------------------------------------- Pain Assessment Details Patient Name: Date of Service: Christian Brown E. 09/19/2021 10:00 A M Medical Record Number: [id number] Patient Account Number: [id number] Date of Birth/Sex: Treating RN: Aug 02, 1965 (56 y.o. [name], [name] Primary Care Jamonte Curfman: [name] Other Clinician: Referring Ernie Sagrero: Treating Deja Pisarski/Extender: [name] in Treatment: 25 Active Problems Location of Pain Severity and Description of Pain Patient Has Paino No Site Locations Pain Management and Medication Current Pain Management: Electronic Signature(s) Signed: 09/19/2021 4:55:17 PM By: [name] RN Signed: 09/24/2021 8:46:10 AM By: Erenest Blank Entered By: Erenest Blank on 09/19/2021 10:33:30 -------------------------------------------------------------------------------- Patient/Caregiver Education Details Patient Name: Date of Service: [name] 6/8/2023andnbsp10:00 A M Medical Record Number: [id number] Patient Account Number: [id number] Date of Birth/Gender: Treating RN: 1965/09/13 (56 y.o. [name] Primary Care Physician: [name] Other Clinician: Referring Physician: Treating Physician/Extender: [name] in Treatment: 25 Education Assessment Education Provided To: Patient Education Topics Provided Wound/Skin Impairment: Methods: Explain/Verbal Responses: Reinforcements needed, [employer]  content correctly Electronic Signature(s) Signed: 09/19/2021 4:55:17 PM By: [name] RN Entered By: [name] on 09/19/2021 10:46:29 -------------------------------------------------------------------------------- Wound Assessment Details Patient Name: Date of Service: Christian Brown E. 09/19/2021 10:00 A M Medical Record Number: [id number] Patient Account  Number: [id number] Date of Birth/Sex: Treating RN: 07-09-1965 (56 y.o. [name], [place] Primary Care Malosi Hemstreet: [name] Other Clinician: Referring [place] Revoir: Treating Estefano Victory/Extender: [name] in Treatment: 25 Wound Status Wound Number: 1 Primary Etiology: Open Surgical Wound Wound Location: Midline Back Wound Status: Open Wounding Event: Surgical Injury Comorbid History: Hypertension, Type II Diabetes, Osteomyelitis Date Acquired: 01/11/2021 Weeks Of Treatment: 25 Clustered Wound: No Photos Wound Measurements Length: (cm) 7.1 Width: (cm) 1.7 Depth: (cm) 0.1 Area: (cm) 9.48 Volume: (cm) 0.948 % Reduction in Area: 87.5% % Reduction in Volume: 99.5% Epithelialization: Small (1-33%) Tunneling: Yes Position (o'clock): 12 Maximum Distance: (cm) 1.5 Undermining: No Wound Description Classification: Full Thickness With Exposed Support Structures Exudate Amount: Medium Exudate Type: Serosanguineous Exudate Color: red, brown Foul Odor After Cleansing: No Slough/Fibrino No Wound Bed Granulation Amount: Large (67-100%) Necrotic Amount: None Present (0%) Treatment Notes Wound #1 (Back) Wound Laterality: Midline Cleanser Peri-Wound Care Topical Primary Dressing Promogran Prisma Matrix, 4.34 (sq in) (silver collagen) Discharge Instruction: Moisten collagen with saline or hydrogel and pack into tunnel @ 12 Secondary Dressing Secured With Compression Wrap Compression Stockings Add-Ons Electronic Signature(s) Signed: 09/19/2021 4:55:17 PM By: [name] RN Entered By: [name] on 09/19/2021 10:36:10 -------------------------------------------------------------------------------- Vitals Details Patient Name: Date of Service: Christian Brown E. 09/19/2021 10:00 A M Medical Record Number: [id number] Patient Account Number: [id number] Date of Birth/Sex: Treating RN: 1965/07/24 (56 y.o. [name] Primary Care Shaunette Gassner: [name] Other Clinician: Referring Ausencio Vaden: Treating Lilyth Lawyer/Extender: [name] in Treatment: 25 Vital Signs Time Taken: 10:32 Temperature (F): 97.7 Pulse (bpm): 90 Respiratory Rate (breaths/min): 17 Blood Pressure (mmHg): 123/75 Reference Range: 80 - 120 mg / dl Electronic Signature(s) Signed: 09/24/2021 8:46:10 AM By: Erenest Blank Entered By: Erenest Blank on 09/19/2021 10:33:22

## 2021-09-24 NOTE — Progress Notes (Signed)
[name], [name] ([id number]) Visit Report for 08/19/2021 SuperBill Details Patient Name: Date of Service: [name], [name] 08/19/2021 Medical Record Number: [id number] Patient Account Number: [id number] Date of Birth/Sex: Treating RN: 05/14/1965 (55 y.o. 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

## 2021-09-24 NOTE — Progress Notes (Signed)
[name], [name] ([id number]) Visit Report for 08/19/2021 Arrival Information Details Patient Name: Date of Service: [name], [name] 08/19/2021 10:45 A M Medical Record Number: [id number] Patient Account Number: [id number] Date of Birth/Sex: Treating RN: 09/13/1965 (56 y.o. [name], [name] Primary Care [name]: [name] Other Clinician: Referring [name]: Treating [name]/Extender: [name]: 20 Visit Information History Since Last Visit Added or deleted any medications: No Patient Arrived: [name] Any new allergies or adverse reactions: No Arrival Time: 10:48 Had a fall or experienced change in No Accompanied By: brother activities of daily living that may affect Transfer Assistance: None risk of falls: Patient Requires Transmission-Based Precautions: No Signs or symptoms of abuse/neglect since last visito No Patient Has Alerts: Yes Hospitalized since last visit: No Patient Alerts: Patient on Blood Thinner Implantable device outside of the clinic excluding No No BP Right Arm-PICC cellular tissue based products placed in the center since last visit: Has Dressing in Place as Prescribed: Yes Pain Present Now: No Electronic Signature(s) Signed: 09/24/2021 8:46:54 AM By: Erenest Blank Entered By: Erenest Blank on 08/19/2021 11:08:44 -------------------------------------------------------------------------------- Encounter Discharge Information Details Patient Name: Date of Service: Christian Brown E. 08/19/2021 10:45 A M Medical Record Number: [id number] Patient Account Number: [id number] Date of Birth/Sex: Treating RN: 1966-04-11 (56 y.o. [name] Primary Care [name]: [name] Other Clinician: Referring Kirstina Leinweber: Treating [name]/Extender: [name]: 20 Encounter Discharge Information Items Discharge Condition: Stable Ambulatory Status: [place] Discharge Destination: Home Transportation:  Private Auto Accompanied By: family Schedule Follow-up Appointment: Yes Clinical Summary of Care: Electronic Signature(s) Signed: 09/24/2021 8:46:54 AM By: Erenest Blank Entered By: Erenest Blank on 08/19/2021 11:18:18 -------------------------------------------------------------------------------- Negative Pressure Christian Therapy Maintenance (NPWT) Details Patient Name: Date of Service: [name], [name] 08/19/2021 10:45 A M Medical Record Number: [id number] Patient Account Number: [id number] Date of Birth/Sex: Treating RN: [birth year] (56 y.o. [name], [name] Primary Care [name]: [name] Other Clinician: Referring Shiro Ellerman: Treating [name]/Extender: [name]: 20 NPWT Maintenance Performed for: [name] Performed By: Christian Hammock, RN Type: VAC System Coverage Size (sq cm): 13.6 Pressure Type: Constant Pressure Setting: 125 mmHG Drain Type: None Primary Contact: Non-Adherent Sponge/Dressing Type: Combination : white and black foam Date Initiated: 03/28/2021 Dressing Removed: Yes Quantity of Sponges/Gauze Removed: 1x white, 1x black Canister Changed: No Canister Exudate Volume: 50 Dressing Reapplied: Yes Quantity of Sponges/Gauze Inserted: 1x black, 1x white Respones T Murphy: o tolerated well Days On NPWT : 145 Electronic Signature(s) Signed: 09/24/2021 8:46:54 AM By: Erenest Blank Entered By: Erenest Blank on 08/19/2021 11:16:56 -------------------------------------------------------------------------------- Christian Assessment Details Patient Name: Date of Service: Christian Brown E. 08/19/2021 10:45 A M Medical Record Number: [id number] Patient Account Number: [id number] Date of Birth/Sex: Treating RN: 04/26/1965 (56 y.o. [name], [name] Primary Care Miliani Deike: [name] Other Clinician: Referring Lexianna Weinrich: Treating Alinna Siple/Extender: [name]: 20 Christian  Status Christian Number: 1 Primary Etiology: Open Surgical Christian Christian Location: Midline Murphy Christian Status: Open Wounding Event: Surgical Injury Date Acquired: 01/11/2021 Weeks Of Murphy: 20 Clustered Christian: No Christian Measurements Length: (cm) 8 Width: (cm) 1.7 Depth: (cm) 1 Area: (cm) 10.681 Volume: (cm) 10.681 % Reduction in Area: 85.9% % Reduction in Volume: 93.9% Christian Description Classification: Full Thickness With Exposed Support Structure Exudate Amount: Medium Exudate Type: Serosanguineous Exudate Color: red, brown Electronic Signature(s) Signed: 08/19/2021 4:01:13 PM By: Christian Hammock RN Signed: 09/24/2021 8:46:54 AM By: Erenest Blank  Entered By: Lance Muss on 08/19/2021 11:09:01 -------------------------------------------------------------------------------- Vitals Details Patient Name: Date of Service: [name], [name] 08/19/2021 10:45 A M Medical Record Number: [id number] Patient Account Number: [id number] Date of Birth/Sex: Treating RN: [birth year] (56 y.o. [name] Primary Care Shakerria Parran: [name] Other Clinician: Referring Aldwin Micalizzi: Treating Keelen Quevedo/Extender: [name]: 20 Vital Signs Time Taken: 10:48 Temperature (F): 97.9 Pulse (bpm): 97 Respiratory Rate (breaths/min): 18 Blood Pressure (mmHg): 110/72 Reference Range: 80 - 120 mg / dl Electronic Signature(s) Signed: 09/24/2021 8:46:54 AM By: Erenest Blank Entered By: Erenest Blank on 08/19/2021 11:08:51

## 2021-09-26 ENCOUNTER — Encounter (HOSPITAL_BASED_OUTPATIENT_CLINIC_OR_DEPARTMENT_OTHER): Payer: No Typology Code available for payment source | Admitting: Internal Medicine

## 2021-09-26 DIAGNOSIS — T8140XA Infection following a procedure, unspecified, initial encounter: Secondary | ICD-10-CM | POA: Diagnosis not present

## 2021-09-26 NOTE — Progress Notes (Signed)
DATON, SZILAGYI ([id number]) Visit Report for 09/23/2021 Arrival Information Details Patient Name: Date of Service: ZHANE, BLUITT 09/23/2021 10:45 A M Medical Record Number: [id number] Patient Account Number: [id number] Date of Birth/Sex: Treating RN: 27-Nov-1965 (56 y.o. [name], [name] Primary Care Kristene Liberati: [name] Other Clinician: Referring Sadia Belfiore: Treating Amnah Breuer/Extender: [name] Weeks in Treatment: 25 Visit Information History Since Last Visit Added or deleted any medications: No Patient Arrived: Ambulatory Any new allergies or adverse reactions: No Arrival Time: 10:49 Had a fall or experienced change in No Accompanied By: friend activities of daily living that may affect Transfer Assistance: None risk of falls: Patient Identification Verified: Yes Signs or symptoms of abuse/neglect since last visito No Secondary Verification Process Completed: Yes Hospitalized since last visit: No Patient Requires Transmission-Based Precautions: No Implantable device outside of the clinic excluding No Patient Has Alerts: Yes cellular tissue based products placed in the center Patient Alerts: Patient on Blood Thinner since last visit: No BP Right Arm-PICC Has Dressing in Place as Prescribed: Yes Pain Present Now: Yes Electronic Signature(s) Signed: 09/26/2021 5:29:07 PM By: [name] RN Entered By: [name] on 09/23/2021 10:49:41 -------------------------------------------------------------------------------- Encounter Discharge Information Details Patient Name: Date of Service: [name] 09/23/2021 10:45 A M Medical Record Number: [id number] Patient Account Number: [id number] Date of Birth/Sex: Treating RN: 25-Nov-1965 (56 y.o. [name], [name] Primary Care Yarima Penman: [name] Other Clinician: Referring Cecily Lawhorne: Treating Chelly Dombeck/Extender: [name] Weeks in Treatment: 25 Encounter Discharge Information  Items Discharge Condition: Stable Ambulatory Status: Ambulatory Discharge Destination: Home Transportation: Private Auto Accompanied By: friend Schedule Follow-up Appointment: Yes Clinical Summary of Care: Patient Declined Electronic Signature(s) Signed: 09/26/2021 5:29:07 PM By: [name] RN Entered By: [name] on 09/23/2021 11:22:35 -------------------------------------------------------------------------------- Negative Pressure Wound Therapy Maintenance (NPWT) Details Patient Name: Date of Service: BELDON, NOWLING 09/23/2021 10:45 A M Medical Record Number: [id number] Patient Account Number: [id number] Date of Birth/Sex: Treating RN: May 03, 1965 (56 y.o. [name], [name] Primary Care Carmelle Bamberg: [name] Other Clinician: Referring Anayiah Howden: Treating Zykerria Tanton/Extender: [name] Weeks in Treatment: 25 NPWT Maintenance Performed for: Wound #1 Midline Back Performed By: [name], RN Type: VAC System Coverage Size (sq cm): 12.07 Pressure Type: Constant Pressure Setting: 125 mmHG Drain Type: None Primary Contact: Non-Adherent Sponge/Dressing Type: Combination : 1 white 1 black Date Initiated: 03/28/2021 Dressing Removed: Yes Quantity of Sponges/Gauze Removed: 1 white 1 black Canister Changed: No Canister Exudate Volume: 25 Dressing Reapplied: Yes Quantity of Sponges/Gauze Inserted: 1 white 1 black Respones T Treatment: o tolerates well Days On NPWT : 180 Electronic Signature(s) Signed: 09/26/2021 5:29:07 PM By: [name] RN Entered By: [name] on 09/23/2021 11:21:53 -------------------------------------------------------------------------------- Patient/Caregiver Education Details Patient Name: Date of Service: [name] 6/12/2023andnbsp10:45 [place] Record Number: [id number] Patient Account Number: [id number] Date of Birth/Gender: Treating RN: 1965-08-10 (56 y.o. [name] Primary  Care Physician: [name] Other Clinician: Referring Physician: Treating Physician/Extender: [name] in Treatment: 25 Education Assessment Education Provided To: Patient Education Topics Provided Wound/Skin Impairment: Methods: Explain/Verbal Responses: Reinforcements needed, State content correctly [employer]) Signed: 09/26/2021 5:29:07 PM By: [name] RN Entered By: [name] on 09/23/2021 11:22:20 -------------------------------------------------------------------------------- Wound Assessment Details Patient Name: Date of Service: [name] 09/23/2021 10:45 A M Medical Record Number: [id number] Patient Account Number: [id number] Date of Birth/Sex: Treating RN: 24-Aug-1965 (56 y.o. [name] Primary Care Wally Behan: [name] Other Clinician: Referring Greogory Cornette:  Treating Roan Sawchuk/Extender: [name] Weeks in Treatment: 25 Wound Status Wound Number: 1 Primary Etiology: Open Surgical Wound Wound Location: Midline Back Wound Status: Open Wounding Event: Surgical Injury Date Acquired: 01/11/2021 Weeks Of Treatment: 25 Clustered Wound: No Wound Measurements Length: (cm) 7.1 Width: (cm) 1.7 Depth: (cm) 0.1 Area: (cm) 9.48 Volume: (cm) 0.948 % Reduction in Area: 87.5% % Reduction in Volume: 99.5% Wound Description Classification: Full Thickness With Exposed Support Structure Exudate Amount: Medium Exudate Type: Serosanguineous Exudate Color: red, brown s Treatment Notes Wound #1 (Back) Wound Laterality: Midline Cleanser Peri-Wound Care Topical Primary Dressing Promogran Prisma Matrix, 4.34 (sq in) (silver collagen) Discharge Instruction: Moisten collagen with saline or hydrogel and pack into tunnel @ 12 Secondary Dressing Secured With Compression Wrap Compression Stockings Add-Ons Electronic Signature(s) Signed: 09/26/2021 5:29:07 PM By: [name] RN Entered By:  [name] on 09/23/2021 10:51:00 -------------------------------------------------------------------------------- Vitals Details Patient Name: Date of Service: [name] 09/23/2021 10:45 A M Medical Record Number: [id number] Patient Account Number: [id number] Date of Birth/Sex: Treating RN: 1966/03/13 (56 y.o. [name] Primary Care Neleh Muldoon: [name] Other Clinician: Referring Latravious Levitt: Treating Karesha Trzcinski/Extender: [name] Weeks in Treatment: 25 Vital Signs Time Taken: 10:49 Temperature (F): 98.7 Pulse (bpm): 84 Respiratory Rate (breaths/min): 17 Blood Pressure (mmHg): 134/74 Reference Range: 80 - 120 mg / dl Electronic Signature(s) Signed: 09/26/2021 5:29:07 PM By: [name] RN Entered By: [name] on 09/23/2021 10:50:51

## 2021-09-26 NOTE — Progress Notes (Signed)
KIOWA, HOLLAR ([id number]) Visit Report for 09/23/2021 SuperBill Details Patient Name: Date of Service: [name], [name] 09/23/2021 Medical Record Number: [id number] Patient Account Number: [id number] Date of Birth/Sex: Treating RN: Nov 08, 1965 (56 y.o. 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

## 2021-09-27 NOTE — Progress Notes (Addendum)
[name], [name] ([id number]) Visit Report for 09/26/2021 Arrival Information Details Patient Name: Date of Service: [name], [name] 09/26/2021 11:30 A M Medical Record Number: [id number] Patient Account Number: [id number] Date of Birth/Sex: Treating RN: 09-10-1965 (56 y.o. [name], [name] Primary Care Miking Usrey: [name] Other Clinician: Referring Ran Tullis: Treating Linn Goetze/Extender: [name] in Treatment: 26 Visit Information History Since Last Visit Added or deleted any medications: No Patient Arrived: Ambulatory Any new allergies or adverse reactions: No Arrival Time: 11:36 Had a fall or experienced change in No Accompanied By: friend activities of daily living that may affect Transfer Assistance: None risk of falls: Patient Identification Verified: Yes Signs or symptoms of abuse/neglect since last visito No Secondary Verification Process Completed: Yes Hospitalized since last visit: No Patient Requires Transmission-Based Precautions: No Implantable device outside of the clinic excluding No Patient Has Alerts: Yes cellular tissue based products placed in the center Patient Alerts: Patient on Blood Thinner since last visit: No BP Right Arm-PICC Has Dressing in Place as Prescribed: Yes Pain Present Now: No Electronic Signature(s) Signed: 09/27/2021 11:45:44 AM By: Erenest Blank Entered By: Erenest Blank on 09/26/2021 11:37:52 -------------------------------------------------------------------------------- Encounter Discharge Information Details Patient Name: Date of Service: Christian Brown E. 09/26/2021 11:30 A M Medical Record Number: [id number] Patient Account Number: [id number] Date of Birth/Sex: Treating RN: 03/06/1966 (56 y.o. [name], [name] Primary Care Gerldine Suleiman: [name] Other Clinician: Referring Amna Welker: Treating Amberlie Gaillard/Extender: [name] Weeks in Treatment: 26 Encounter Discharge Information Items Discharge  Condition: Stable Ambulatory Status: Ambulatory Discharge Destination: Home Transportation: Private Auto Accompanied By: friend Schedule Follow-up Appointment: Yes Clinical Summary of Care: Patient Declined Electronic Signature(s) Signed: 09/27/2021 12:21:07 PM By: [name] RN Entered By: [name] on 09/27/2021 11:53:01 -------------------------------------------------------------------------------- Negative Pressure Wound Therapy Maintenance (NPWT) Details Patient Name: Date of Service: [name], [name] 09/26/2021 11:30 A M Medical Record Number: [id number] Patient Account Number: [id number] Date of Birth/Sex: Treating RN: 29-Dec-1965 (56 y.o. [name], [name] Primary Care Elonda Giuliano: [name] Other Clinician: Referring Marigene Erler: Treating Aubert Choyce/Extender: [name] Weeks in Treatment: 26 NPWT Maintenance Performed for: Wound #1 Midline Back Performed By: [name], RN Type: VAC System Coverage Size (sq cm): 12.07 Pressure Type: Constant Pressure Setting: 125 mmHG Drain Type: None Primary Contact: Non-Adherent Sponge/Dressing Type: Combination : 1 white 1 black Date Initiated: 03/28/2021 Dressing Removed: Yes Quantity of Sponges/Gauze Removed: 1 white 1 black Canister Changed: Yes Canister Exudate Volume: 50 Dressing Reapplied: Yes Quantity of Sponges/Gauze Inserted: 1 white 1 black Respones T Treatment: o tolerates well Days On NPWT : 183 Electronic Signature(s) Signed: 09/27/2021 12:21:07 PM By: [name] RN Entered By: [name] on 09/27/2021 11:52:34 -------------------------------------------------------------------------------- Patient/Caregiver Education Details Patient Name: Date of Service: [name] 6/15/2023andnbsp11:30 [place] Record Number: [id number] Patient Account Number: [id number] Date of Birth/Gender: Treating RN: [birth year] (56 y.o. [name] Primary Care Physician:  [name] Other Clinician: Referring Physician: Treating Physician/Extender: [name] in Treatment: 26 Education Assessment Education Provided To: Patient Education Topics Provided Wound/Skin Impairment: Methods: Explain/Verbal Responses: Reinforcements needed, State content correctly [employer]) Signed: 09/27/2021 12:21:07 PM By: [name] RN Entered By: [name] on 09/27/2021 11:52:48 -------------------------------------------------------------------------------- Wound Assessment Details Patient Name: Date of Service: Christian Brown E. 09/26/2021 11:30 A M Medical Record Number: [id number] Patient Account Number: [id number] Date of Birth/Sex: Treating RN: June 01, 1965 (56 y.o. [name] Primary Care Amy Belloso: [name] Other Clinician: Referring Theus Espin: Treating  Eliberto Sole/Extender: [name] Weeks in Treatment: 26 Wound Status Wound Number: 1 Primary Etiology: Open Surgical Wound Wound Location: Midline Back Wound Status: Open Wounding Event: Surgical Injury Date Acquired: 01/11/2021 Weeks Of Treatment: 26 Clustered Wound: No Wound Measurements Length: (cm) 7.1 Width: (cm) 1.7 Depth: (cm) 0.1 Area: (cm) 9.48 Volume: (cm) 0.948 % Reduction in Area: 87.5% % Reduction in Volume: 99.5% Wound Description Classification: Full Thickness With Exposed Support Structure Exudate Amount: Medium Exudate Type: Serosanguineous Exudate Color: red, brown s Treatment Notes Wound #1 (Back) Wound Laterality: Midline Cleanser Peri-Wound Care Topical Primary Dressing Promogran Prisma Matrix, 4.34 (sq in) (silver collagen) Discharge Instruction: Moisten collagen with saline or hydrogel and pack into tunnel @ 12 Secondary Dressing Secured With Compression Wrap Compression Stockings Add-Ons Electronic Signature(s) Signed: 09/27/2021 12:21:07 PM By: [name] RN Entered By: [name] on 09/27/2021 11:51:16 -------------------------------------------------------------------------------- Vitals Details Patient Name: Date of Service: Christian Brown E. 09/26/2021 11:30 A M Medical Record Number: [id number] Patient Account Number: [id number] Date of Birth/Sex: Treating RN: [birth year] (56 y.o. [name] Primary Care Khaleesi Gruel: [name] Other Clinician: Referring Athea Haley: Treating Obe Ahlers/Extender: [name] Weeks in Treatment: 26 Vital Signs Time Taken: 11:37 Temperature (F): 98.1 Pulse (bpm): 92 Respiratory Rate (breaths/min): 18 Blood Pressure (mmHg): 119/70 Reference Range: 80 - 120 mg / dl Electronic Signature(s) Signed: 09/27/2021 11:45:44 AM By: Erenest Blank Entered By: Erenest Blank on 09/26/2021 11:38:12

## 2021-09-27 NOTE — Progress Notes (Signed)
JETT, FUKUDA ([id number]) Visit Report for 09/26/2021 SuperBill Details Patient Name: Date of Service: [name], [name] 09/26/2021 Medical Record Number: [id number] Patient Account Number: [id number] Date of Birth/Sex: Treating RN: 1965-08-08 (56 y.o. 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

## 2021-09-30 ENCOUNTER — Encounter (HOSPITAL_BASED_OUTPATIENT_CLINIC_OR_DEPARTMENT_OTHER): Payer: No Typology Code available for payment source | Admitting: Internal Medicine

## 2021-09-30 DIAGNOSIS — T8140XA Infection following a procedure, unspecified, initial encounter: Secondary | ICD-10-CM | POA: Diagnosis not present

## 2021-09-30 NOTE — Progress Notes (Signed)
FAYE, SANFILIPPO ([id number]) Visit Report for 09/30/2021 SuperBill Details Patient Name: Date of Service: [name], [name] 09/30/2021 Medical Record Number: [id number] Patient Account Number: [id number] Date of Birth/Sex: Treating RN: 03/13/1966 (56 y.o. 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

## 2021-10-01 ENCOUNTER — Other Ambulatory Visit: Payer: Self-pay

## 2021-10-01 ENCOUNTER — Other Ambulatory Visit (HOSPITAL_COMMUNITY): Payer: Self-pay

## 2021-10-01 ENCOUNTER — Ambulatory Visit (INDEPENDENT_AMBULATORY_CARE_PROVIDER_SITE_OTHER): Payer: No Typology Code available for payment source | Admitting: Infectious Diseases

## 2021-10-01 VITALS — BP 136/80 | HR 106 | Resp 16 | Ht 74.0 in | Wt 267.0 lb

## 2021-10-01 DIAGNOSIS — T847XXD Infection and inflammatory reaction due to other internal orthopedic prosthetic devices, implants and grafts, subsequent encounter: Secondary | ICD-10-CM

## 2021-10-01 DIAGNOSIS — Z5181 Encounter for therapeutic drug level monitoring: Secondary | ICD-10-CM

## 2021-10-01 DIAGNOSIS — G8929 Other chronic pain: Secondary | ICD-10-CM

## 2021-10-01 MED ORDER — CEFADROXIL 500 MG PO CAPS
500.0000 mg | ORAL_CAPSULE | Freq: Two times a day (BID) | ORAL | 11 refills | Status: DC
  Filled 2021-10-01 – 2021-10-10 (×2): qty 60, 30d supply, fill #0
  Filled 2021-11-02: qty 60, 30d supply, fill #1
  Filled 2021-12-02: qty 60, 30d supply, fill #2
  Filled 2022-01-01: qty 60, 30d supply, fill #3
  Filled 2022-02-21: qty 60, 30d supply, fill #4
  Filled 2022-03-18: qty 60, 30d supply, fill #5
  Filled 2022-04-17: qty 60, 30d supply, fill #6
  Filled 2022-05-17: qty 60, 30d supply, fill #7
  Filled 2022-06-19: qty 60, 30d supply, fill #8
  Filled 2022-07-19: qty 60, 30d supply, fill #9
  Filled 2022-08-31: qty 60, 30d supply, fill #10
  Filled 2022-09-24: qty 60, 30d supply, fill #11

## 2021-10-01 NOTE — Progress Notes (Addendum)
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   Klebsiella pneumoniae infection 01/28/2021   Wound infection after surgery 01/25/2021   Muscle spasms of both lower extremities 01/25/2021   Abdominal pain 01/25/2021   Spleen enlarged 01/25/2021   Lumbar radiculopathy 01/11/2021   Spasticity 12/03/2020   Neuropathy 09/28/2020   Chronic pain syndrome 09/28/2020   Intervertebral lumbar disc disorder with myelopathy, lumbar region 09/28/2020   Spondylosis, cervical, with myelopathy 09/28/2020   Unilateral primary osteoarthritis, right knee 10/28/2019   PCP NOTES >>>>>>>> 07/09/2015   DJD (degenerative joint disease) 10/27/2014   Screening for heart disease 06/28/2012   OSA (obstructive sleep apnea) 06/14/2012   ED (erectile dysfunction) 04/26/2012   Annual physical exam 12/29/2011   Achilles tendinitis 09/10/2010   Allergic rhinitis 07/12/2010   Hyperlipidemia 04/26/2007   DM II (diabetes mellitus, type II), controlled ([place]) 04/22/2006   OBESITY NOS 04/22/2006   Essential hypertension 04/22/2006   Current Outpatient Medications on File Prior to Visit  Medication Sig Dispense Refill   ascorbic acid (VITAMIN C) 500 MG tablet Take 1 tablet (500 mg total) by mouth 2 (two) times daily. 100 tablet 3   blood glucose meter kit and supplies Use up to four times daily as directed. Dx: E11.9 1 each 0   Blood Glucose Monitoring Suppl ([place]) w/Device KIT Test blood sugar twice a day.  Dx code:  E11.9 1 kit 0   cyclobenzaprine (FLEXERIL) 10 MG tablet Take 1 tablet (10 mg total) by mouth 3 (three) times daily as needed for muscle spasms. 90 tablet 0   diazepam (VALIUM) 5 MG tablet Take 1 tablet (5 mg total) by mouth at bedtime. 30 tablet 0   fluticasone (FLONASE) 50 MCG/ACT nasal spray Place into the nose.     gabapentin (NEURONTIN) 600 MG tablet Take 1 tablet (600 mg total) by mouth 3 (three) times daily. 270 tablet 1   glucose blood (ONETOUCH VERIO) test strip Check blood sugars before meals and at bedtime.  twice daily 200 strip 12   HYDROmorphone (DILAUDID) 2 MG tablet Take 1 tablet (2 mg total) by mouth every Monday, Wednesday, and Friday. Take 30 minutes prior to Wound VAC change 28 tablet 0   morphine (MS CONTIN) 30 MG 12 hr tablet Take 1 tablet (30 mg total) by mouth every 12 (twelve) hours. 60 tablet 0   naloxone (NARCAN) nasal spray 4 mg/0.1 mL Use as directed 2 each 1   ondansetron (ZOFRAN) 4 MG tablet TAKE 1 TABLET BY MOUTH EVERY 8 HOURS AS NEEDED FOR NAUSEA/VOMITING. CAN CAUSE CONSTIPATION- MONITOR 90 tablet 1   OneTouch Delica Lancets 97W MISC Test blood sugar before meals and at bedtime.  Dx code: E11.9 200 each 12   oxyCODONE (ROXICODONE) 15 MG immediate release tablet Take 1 tablet (15 mg total) by mouth every 8 (eight) hours as needed ((score 7 to 10)). 90 tablet 0   polyethylene glycol (MIRALAX / GLYCOLAX) 17 g packet  Take 17 g by mouth 2 (two) times daily. 14 each 0   promethazine (PHENERGAN) 12.5 MG tablet Take 1 tablet (12.5 mg total) by mouth every 6 (six) hours as needed for nausea or vomiting. 90 tablet 5   rosuvastatin (CRESTOR) 20 MG tablet Take 1 tablet (20 mg total) by mouth daily. PATIENT MUST SCHEDULE ANNUAL APPOINTMENT FOR FUTURE REFILLS. 90 tablet 0   senna (SENOKOT) 8.6 MG TABS tablet Take 3 tablets (25.8 mg total) by mouth 2 (two) times daily. 270 tablet 0   simethicone (MYLICON) 80 MG chewable tablet Chew 1 tablet (80 mg total) by mouth 4 (four) times daily  as needed for flatulence. 60 tablet 0   tadalafil (CIALIS) 20 MG tablet Take 0.5-1 tablets (10-20 mg total) by mouth every other day as needed for erectile dysfunction. 30 tablet 3   tamsulosin (FLOMAX) 0.4 MG CAPS capsule Take 2 capsules (0.8 mg total) by mouth daily. 60 capsule 5   tiZANidine (ZANAFLEX) 4 MG tablet Take 1 tablet (4 mg total) by mouth 3 (three) times daily. 90 tablet 5   Zinc Sulfate 220 (50 Zn) MG TABS Take 1 tablet (220 mg total) by mouth daily. 100 tablet 5   No current facility-administered medications on file prior to visit.    Subjective: Here for follow up in the setting of deep lumbar wound infection associated with hardware. He is taking cefadroxil 500 mg PO BID without any issues. Denies fevers, chills and sweats. Denies nausea, vomiting and diarrhea. He still has wound vac, closely following with wound care. He has been using silver collagen under wound vac. I  have reviewed their notes and seems to be clinically healing. He does not have a fu with plastics and was told to have wound vac let it run its course. He needs refills for cefadroxil. He follows with Dr Dagoberto Ligas for pain management and takes morphine MS continue 81m PO BID, oxycodone as needed. He is s/p trigger point injections in the left lumbar paraspinal area.   Review of Systems: All other systems reviewed  with pertinent positives and negatives as listed above  Past Medical History:  Diagnosis Date   DDD (degenerative disc disease), lumbar    Diabetes mellitus    Hyperlipemia    Hypertension    Obesity    OSA (obstructive sleep apnea)    on CPAP   Past Surgical History:  Procedure Laterality Date   APPLICATION OF ROBOTIC ASSISTANCE FOR SPINAL PROCEDURE N/A 01/11/2021   Procedure: APPLICATION OF ROBOTIC ASSISTANCE FOR SPINAL PROCEDURE;  Surgeon: OJudith Part MD;  [facility]  Service: Neurosurgery;  Laterality: N/A;   APPLICATION OF WOUND VAC N/A 01/25/2021   Procedure: APPLICATION OF  WOUND VAC;  Surgeon: Dawley, TTheodoro Doing DO;  Location: [facility]  Service: Neurosurgery;  Laterality: N/A;   BACK SURGERY     COLONOSCOPY  2019   LUMBAR WOUND DEBRIDEMENT N/A 01/25/2021   Procedure: Irrigation and Debridement of lumbar wound, and wound vacuum assisted closure;  Surgeon: Dawley, TTheodoro Doing DO;  [facility]  Service: Neurosurgery;  Laterality: N/A;   TRANSFORAMINAL LUMBAR INTERBODY FUSION (TLIF) WITH PEDICLE SCREW FIXATION 4 LEVEL N/A 01/11/2021   Procedure: Lumbar one-two, Lumbar two-three, Lumbar three-four, Lumbar four-five, Lumbar five Sacral one Open decompression, Transforaminal lumbar interbody fusion, posterolateral instrumented fusion;  Surgeon: OJudith Part MD;  [facility]  Service: Neurosurgery;  Laterality: N/A;    Social History   Tobacco Use   Smoking status:  Never   Smokeless tobacco: Never  Vaping Use   Vaping Use: Never used  Substance Use Topics   Alcohol use: No   Drug use: No    Family History  Problem Relation Age of Onset   Diabetes Mother    Hypertension Neg Hx    Coronary artery disease Neg Hx    Colon cancer Neg Hx    Prostate cancer Neg Hx     Allergies  Allergen Reactions   Cymbalta [Duloxetine Hcl] Other (See Comments)   Penicillins Other (See Comments)    REACTION: Blister and Sores in the mouth    Health Maintenance  Topic Date Due   Zoster Vaccines- Shingrix (1 of 2) Never done   OPHTHALMOLOGY EXAM  06/12/2017   COVID-19 Vaccine (5 - Pfizer series) 11/09/2020   FOOT EXAM  02/09/2021   INFLUENZA VACCINE  11/12/2021   TETANUS/TDAP  12/28/2021   HEMOGLOBIN A1C  02/05/2022   COLONOSCOPY (Pts 45-74yr Insurance coverage will need to be confirmed)  01/11/2025   Hepatitis C Screening  Completed   HIV Screening  Completed   HPV VACCINES  Aged Out    Objective: BP 136/80   Pulse (!) 106   Resp 16   Ht '6\' 2"'  (1.88 m)   Wt 267 lb (121.1 kg)   SpO2 98%   BMI 34.28 kg/m    Physical Exam Constitutional:       Appearance: Normal appearance. HENT:     Head: Normocephalic and atraumatic.      Mouth: Mucous membranes are moist.  Eyes:    Conjunctiva/sclera: Conjunctivae normal.     Pupils: Pupils are equal, round,  Cardiovascular:     Rate and Rhythm: Normal rate and regular rhythm.     Heart sounds:   Pulmonary:     Effort: Pulmonary effort is normal.     Breath sounds:   Abdominal:     General:     Palpations: Abdomen is soft.   Musculoskeletal:        General: Normal range of motion.   Skin:    General: Skin is warm and dry.     Comments:     Neurological:     General: grossly non focal and ambulatory     Mental Status: awake,  alert and oriented to person, place, and time.   Psychiatric:        Mood and Affect: Mood normal.   Lab Results Lab Results  Component Value Date   WBC 5.8 06/25/2021   HGB 11.7 (L) 06/25/2021   HCT 36.8 (L) 06/25/2021   MCV 85.6 06/25/2021   PLT 257 06/25/2021    Lab Results  Component Value Date   CREATININE 0.93 06/25/2021   BUN 11 06/25/2021   NA 139 06/25/2021   K 3.8 06/25/2021   CL 102 06/25/2021   CO2 27 06/25/2021    Lab Results  Component Value Date   ALT 8 05/03/2021   AST 13 05/03/2021   ALKPHOS 86 05/03/2021   BILITOT 0.4 05/03/2021    Lab Results  Component Value Date   CHOL 126 11/19/2020   HDL 34.00 (L) 11/19/2020   LDLCALC 62 11/19/2020   LDLDIRECT 118.0 09/14/2020   TRIG 148.0 11/19/2020   CHOLHDL 4 11/19/2020   No results found for: "LABRPR", "RPRTITER" No results found for: "HIV1RNAQUANT", "HIV1RNAVL", "CD4TABS"  Problem List Items Addressed This Visit       Other   Hardware complicating wound infection ([place]   Relevant  Medications   cefadroxil (DURICEF) 500 MG capsule   Medication monitoring encounter - Primary   Relevant Orders   CBC   Basic metabolic panel   C-reactive protein   Sedimentation rate   Assessment/Plan # Klebsiella 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  SaWilber OliphantMDOkmulgeeor Infect[facility] Disease Co[facility] 10/01/2021, 10:54 AM

## 2021-10-02 LAB — SEDIMENTATION RATE: Sed Rate: 29 mm/h — ABNORMAL HIGH (ref 0–20)

## 2021-10-02 LAB — CBC
HCT: 38.9 % (ref 38.5–50.0)
Hemoglobin: 13 g/dL — ABNORMAL LOW (ref 13.2–17.1)
MCH: 28.3 pg (ref 27.0–33.0)
MCHC: 33.4 g/dL (ref 32.0–36.0)
MCV: 84.7 fL (ref 80.0–100.0)
MPV: 11.2 fL (ref 7.5–12.5)
Platelets: 249 10*3/uL (ref 140–400)
RBC: 4.59 10*6/uL (ref 4.20–5.80)
RDW: 13.8 % (ref 11.0–15.0)
WBC: 6.7 10*3/uL (ref 3.8–10.8)

## 2021-10-02 LAB — BASIC METABOLIC PANEL
BUN: 16 mg/dL (ref 7–25)
CO2: 29 mmol/L (ref 20–32)
Calcium: 9.7 mg/dL (ref 8.6–10.3)
Chloride: 104 mmol/L (ref 98–110)
Creat: 1.14 mg/dL (ref 0.70–1.30)
Glucose, Bld: 123 mg/dL — ABNORMAL HIGH (ref 65–99)
Potassium: 4 mmol/L (ref 3.5–5.3)
Sodium: 141 mmol/L (ref 135–146)

## 2021-10-02 LAB — C-REACTIVE PROTEIN: CRP: 14.8 mg/L — ABNORMAL HIGH (ref <8.0)

## 2021-10-02 NOTE — Progress Notes (Signed)
JONAEL, PARADISO ([id number]) Visit Report for 09/30/2021 Arrival Information Details Patient Name: Date of Service: TERRIE, GRAJALES 09/30/2021 9:15 A M Medical Record Number: [id number] Patient Account Number: [id number] Date of Birth/Sex: Treating RN: Mar 07, 1966 (56 y.o. [name], [name] Primary Care Kinslee Dalpe: [name] Other Clinician: Referring Misael Mcgaha: Treating Kaeden Mester/Extender: [name] in Treatment: 26 Visit Information History Since Last Visit Added or deleted any medications: No Patient Arrived: Ambulatory Any new allergies or adverse reactions: No Arrival Time: 09:32 Had a fall or experienced change in No Accompanied By: self activities of daily living that may affect Transfer Assistance: None risk of falls: Patient Identification Verified: Yes Signs or symptoms of abuse/neglect since last visito No Secondary Verification Process Completed: Yes Hospitalized since last visit: No Patient Requires Transmission-Based Precautions: No Implantable device outside of the clinic excluding No Patient Has Alerts: Yes cellular tissue based products placed in the center Patient Alerts: Patient on Blood Thinner since last visit: No BP Right Arm-PICC Has Dressing in Place as Prescribed: Yes Pain Present Now: Yes Electronic Signature(s) Signed: 09/30/2021 4:08:19 PM By: Erenest Blank Entered By: Erenest Blank on 09/30/2021 09:33:32 -------------------------------------------------------------------------------- Encounter Discharge Information Details Patient Name: Date of Service: [name] 09/30/2021 9:15 A M Medical Record Number: [id number] Patient Account Number: [id number] Date of Birth/Sex: Treating RN: 1965/07/17 (56 y.o. [name], [name] Primary Care Quitman Norberto: [name] Other Clinician: Referring Dimitriy Carreras: Treating Sitara Cashwell/Extender: [name] Weeks in Treatment: 26 Encounter Discharge Information Items Discharge  Condition: Stable Ambulatory Status: Ambulatory Discharge Destination: Home Transportation: Private Auto Accompanied By: self Schedule Follow-up Appointment: Yes Clinical Summary of Care: Electronic Signature(s) Signed: 09/30/2021 4:08:19 PM By: Erenest Blank Entered By: Erenest Blank on 09/30/2021 16:03:59 -------------------------------------------------------------------------------- Negative Pressure Wound Therapy Maintenance (NPWT) Details Patient Name: Date of Service: TRUE, GARCIAMARTINEZ 09/30/2021 9:15 A M Medical Record Number: [id number] Patient Account Number: [id number] Date of Birth/Sex: Treating RN: July 11, 1965 (56 y.o. [name], [name] Primary Care Chakia Counts: [name] Other Clinician: Referring Katianna Mcclenney: Treating Marquice Uddin/Extender: [name] Weeks in Treatment: 26 NPWT Maintenance Performed for: Wound #1 Midline Back Performed By: Erenest Blank, Type: VAC System Coverage Size (sq cm): 12.07 Pressure Type: Constant Pressure Setting: 125 mmHG Drain Type: None Primary Contact: Non-Adherent Sponge/Dressing Type: Combination : white and black foam Date Initiated: 03/28/2021 Dressing Removed: Yes Quantity of Sponges/Gauze Removed: 1 white and 1 black Canister Changed: No Dressing Reapplied: Yes Quantity of Sponges/Gauze Inserted: 1 white and 1 black Respones T Treatment: o tolerated well Days On NPWT : 187 Electronic Signature(s) Signed: 09/30/2021 4:08:19 PM By: Erenest Blank Entered By: Erenest Blank on 09/30/2021 16:02:25 -------------------------------------------------------------------------------- Wound Assessment Details Patient Name: Date of Service: [name] 09/30/2021 9:15 A M Medical Record Number: [id number] Patient Account Number: [id number] Date of Birth/Sex: Treating RN: 1965-08-30 (56 y.o. [name], [name] Primary Care Lajuan Godbee: [name] Other Clinician: Referring Mylez Venable: Treating Sabryna Lahm/Extender:  [name] Weeks in Treatment: 26 Wound Status Wound Number: 1 Primary Etiology: Open Surgical Wound Wound Location: Midline Back Wound Status: Open Wounding Event: Surgical Injury Date Acquired: 01/11/2021 Weeks Of Treatment: 26 Clustered Wound: No Wound Measurements Length: (cm) 7.1 Width: (cm) 1.7 Depth: (cm) 0.1 Area: (cm) 9.48 Volume: (cm) 0.948 % Reduction in Area: 87.5% % Reduction in Volume: 99.5% Wound Description Classification: Full Thickness With Exposed Support Structure Exudate Amount: Medium Exudate Type: Serosanguineous Exudate Color: red, brown Treatment Notes Wound #1 (Back) Wound Laterality: Midline Cleanser Peri-Wound Care  Topical Primary Dressing Promogran Prisma Matrix, 4.34 (sq in) (silver colla Discharge Instruction: Moisten collagen with saline s gen) or hydrogel and pack into tunnel @ 12 Secondary Dressing Secured With Compression Wrap Compression Stockings Add-Ons Electronic Signature(s) Signed: 09/30/2021 4:08:19 PM By: Erenest Blank Signed: 10/02/2021 4:06:51 PM By: [name] RN Entered By: Erenest Blank on 09/30/2021 10:31:19 -------------------------------------------------------------------------------- Vitals Details Patient Name: Date of Service: [name] 09/30/2021 9:15 A M Medical Record Number: [id number] Patient Account Number: [id number] Date of Birth/Sex: Treating RN: November 15, 1965 (56 y.o. [name] Primary Care Janaki Exley: [name] Other Clinician: Referring Madina Galati: Treating Yanice Maqueda/Extender: [name] Weeks in Treatment: 26 Vital Signs Time Taken: 09:33 Temperature (F): 97.9 Pulse (bpm): 99 Respiratory Rate (breaths/min): 18 Blood Pressure (mmHg): 148/88 Reference Range: 80 - 120 mg / dl Electronic Signature(s) Signed: 09/30/2021 4:08:19 PM By: Erenest Blank Entered By: Erenest Blank on 09/30/2021 09:34:02

## 2021-10-03 ENCOUNTER — Encounter (HOSPITAL_BASED_OUTPATIENT_CLINIC_OR_DEPARTMENT_OTHER): Payer: No Typology Code available for payment source | Admitting: Internal Medicine

## 2021-10-03 DIAGNOSIS — L98424 Non-pressure chronic ulcer of back with necrosis of bone: Secondary | ICD-10-CM | POA: Diagnosis not present

## 2021-10-03 DIAGNOSIS — E11622 Type 2 diabetes mellitus with other skin ulcer: Secondary | ICD-10-CM

## 2021-10-03 DIAGNOSIS — M869 Osteomyelitis, unspecified: Secondary | ICD-10-CM

## 2021-10-03 DIAGNOSIS — T8140XA Infection following a procedure, unspecified, initial encounter: Secondary | ICD-10-CM | POA: Diagnosis not present

## 2021-10-03 NOTE — Progress Notes (Signed)
[name], [name] ([id number]) Visit Report for 10/03/2021 Arrival Information Details Patient Name: Date of Service: Christian, RACZKOWSKI 10/03/2021 9:15 A M Medical Record Number: [id number] Patient Account Number: [id number] Date of Birth/Sex: Treating RN: 1965-08-03 (56 y.o. [name], [name] Primary Care Tyreanna Bisesi: [name] Other Clinician: Referring Prosperity Darrough: Treating Dealie Koelzer/Extender: [name] in Treatment: 27 Visit Information History Since Last Visit Added or deleted any medications: No Patient Arrived: Ambulatory Any new allergies or adverse reactions: No Arrival Time: 09:30 Had a fall or experienced change in No Accompanied By: self activities of daily living that may affect Transfer Assistance: None risk of falls: Patient Identification Verified: Yes Signs or symptoms of abuse/neglect since last visito No Secondary Verification Process Completed: Yes Hospitalized since last visit: No Patient Requires Transmission-Based Precautions: No Implantable device outside of the clinic excluding No Patient Has Alerts: Yes cellular tissue based products placed in the center Patient Alerts: Patient on Blood Thinner since last visit: No BP Right Arm-PICC Has Dressing in Place as Prescribed: Yes Pain Present Now: Yes Electronic Signature(s) Signed: 10/03/2021 4:05:31 PM By: [name] RN Entered By: [name] on 10/03/2021 09:30:26 -------------------------------------------------------------------------------- Encounter Discharge Information Details Patient Name: Date of Service: [name]. 10/03/2021 9:15 A M Medical Record Number: [id number] Patient Account Number: [id number] Date of Birth/Sex: Treating RN: 08/22/1965 (56 y.o. [name], [name] Primary Care Josalin Carneiro: [name] Other Clinician: Referring Ridge Lafond: Treating Sareen Randon/Extender: [name] Weeks in Treatment: 27 Encounter Discharge Information  Items Discharge Condition: Stable Ambulatory Status: Ambulatory Discharge Destination: Home Transportation: Private Auto Accompanied By: self Schedule Follow-up Appointment: Yes Clinical Summary of Care: Patient Declined Electronic Signature(s) Signed: 10/03/2021 4:05:31 PM By: [name] RN Entered By: [name] on 10/03/2021 09:51:08 -------------------------------------------------------------------------------- Lower Extremity Assessment Details Patient Name: Date of Service: [name]. 10/03/2021 9:15 A M Medical Record Number: [id number] Patient Account Number: [id number] Date of Birth/Sex: Treating RN: 11/23/1965 (56 y.o. [name] Primary Care Rei Medlen: [name] Other Clinician: Referring Socorro Kanitz: Treating Rishit Burkhalter/Extender: [name] Weeks in Treatment: 27 Electronic Signature(s) Signed: 10/03/2021 4:05:31 PM By: [name] RN Entered By: [name] on 10/03/2021 09:32:46 -------------------------------------------------------------------------------- Multi Wound Chart Details Patient Name: Date of Service: [name]. 10/03/2021 9:15 A M Medical Record Number: [id number] Patient Account Number: [id number] Date of Birth/Sex: Treating RN: 29-Jul-1965 (56 y.o. [name], [name] Primary Care Randee Huston: [name] Other Clinician: Referring Karishma Unrein: Treating Deidre Carino/Extender: [name] Weeks in Treatment: 27 Vital Signs Height(in): Pulse(bpm): 94 Weight(lbs): Blood Pressure(mmHg): 118/74 Body Mass Index(BMI): Temperature(F): 97.6 Respiratory Rate(breaths/min): 17 Photos: [N/A:N/A] Midline Back N/A N/A Wound Location: Surgical Injury N/A N/A Wounding Event: Open Surgical Wound N/A N/A Primary Etiology: Hypertension, Type II Diabetes, N/A N/A Comorbid History: Osteomyelitis 01/11/2021 N/A N/A Date Acquired: 27 N/A N/A Weeks of Treatment: Open N/A N/A Wound Status: No  N/A N/A Wound Recurrence: 6.7x1.2x1 N/A N/A Measurements L x W x D (cm) 6.315 N/A N/A A (cm) : rea 6.315 N/A N/A Volume (cm) : 91.60% N/A N/A % Reduction in A rea: 96.40% N/A N/A % Reduction in Volume: 12 Position 1 (o'clock): 1.5 Maximum Distance 1 (cm): Yes N/A N/A Tunneling: Full Thickness With Exposed Support N/A N/A Classification: Structures Medium N/A N/A Exudate Amount: Serosanguineous N/A N/A Exudate Type: red, brown N/A N/A Exudate Color: Distinct, outline attached N/A N/A Wound Margin: Large (67-100%) N/A N/A Granulation Amount: Red, Pink N/A N/A Granulation Quality: Small (1-33%) N/A N/A  Necrotic Amount: Fat Layer (Subcutaneous Tissue): Yes N/A N/A Exposed Structures: Fascia: No Tendon: No Muscle: No Joint: No Bone: No Small (1-33%) N/A N/A Epithelialization: Negative Pressure Wound Therapy N/A N/A Procedures Performed: Maintenance (NPWT) Treatment Notes Wound #1 (Back) Wound Laterality: Midline Cleanser Peri-Wound Care Topical Primary Dressing Promogran Prisma Matrix, 4.34 (sq in) (silver collagen) Discharge Instruction: Moisten collagen with saline or hydrogel and pack into tunnel @ 12 Secondary Dressing Secured With Compression Wrap Compression Stockings Add-Ons Electronic Signature(s) Signed: 10/03/2021 12:20:21 PM By: Kalman Shan DO Signed: 10/03/2021 4:05:31 PM By: [name] RN Entered By: Kalman Shan on 10/03/2021 11:58:59 -------------------------------------------------------------------------------- Multi-Disciplinary Care Plan Details Patient Name: Date of Service: Christian Brown E. 10/03/2021 9:15 A M Medical Record Number: [id number] Patient Account Number: [id number] Date of Birth/Sex: Treating RN: 1965/07/20 (56 y.o. [name], [name] Primary Care Mahkai Fangman: [name] Other Clinician: Referring Rickia Freeburg: Treating Cindie Rajagopalan/Extender: [name] Weeks in Treatment:  Cobb reviewed with physician Active Inactive Wound/Skin Impairment Nursing Diagnoses: Impaired tissue integrity Goals: Patient/caregiver will verbalize understanding of skin care regimen Date Initiated: 03/28/2021 Target Resolution Date: 10/12/2021 Goal Status: Active Ulcer/skin breakdown will have a volume reduction of 30% by week 4 Date Initiated: 03/28/2021 Date Inactivated: 05/27/2021 Target Resolution Date: 05/31/2021 Goal Status: Met Interventions: Assess patient/caregiver ability to obtain necessary supplies Assess patient/caregiver ability to perform ulcer/skin care regimen upon admission and as needed Assess ulceration(s) every visit Provide education on ulcer and skin care Treatment Activities: Topical wound management initiated : 03/28/2021 Notes: Electronic Signature(s) Signed: 10/03/2021 4:05:31 PM By: [name] RN Entered By: [name] on 10/03/2021 09:47:21 -------------------------------------------------------------------------------- Negative Pressure Wound Therapy Maintenance (NPWT) Details Patient Name: Date of Service: LATRELLE, BAZAR 10/03/2021 9:15 A M Medical Record Number: [id number] Patient Account Number: [id number] Date of Birth/Sex: Treating RN: [birth year] (56 y.o. [name], [name] Primary Care Brandin Dilday: [name] Other Clinician: Referring [place] Droll: Treating Lovella Hardie/Extender: [name] Weeks in Treatment: 27 NPWT Maintenance Performed for: Wound #1 Midline Back Performed By: [name], RN Type: VAC System Coverage Size (sq cm): 8.04 Pressure Type: Constant Pressure Setting: 125 mmHG Drain Type: None Primary Contact: Non-Adherent Sponge/Dressing Type: Combination : 1 WHITE FOAM 1 BLACK FOAM Date Initiated: 03/28/2021 Dressing Removed: Yes Quantity of Sponges/Gauze Removed: 1 WHITE 1 BLACK Canister Changed: Yes Canister Exudate Volume: 50 Dressing Reapplied:  Yes Quantity of Sponges/Gauze Inserted: 1 WHITE 1 BLACK Respones T Treatment: o TOLERATES WELL Days On NPWT : 190 Post Procedure Diagnosis Same as Pre-procedure Electronic Signature(s) Signed: 10/03/2021 4:05:31 PM By: [name] RN Entered By: [name] on 10/03/2021 09:50:07 -------------------------------------------------------------------------------- Pain Assessment Details Patient Name: Date of Service: [name]. 10/03/2021 9:15 A M Medical Record Number: [id number] Patient Account Number: [id number] Date of Birth/Sex: Treating RN: 01-04-1966 (56 y.o. [name] Primary Care Kord Monette: [name] Other Clinician: Referring Hadassa Cermak: Treating Jawanda Passey/Extender: [name] Weeks in Treatment: 27 Active Problems Location of Pain Severity and Description of Pain Patient Has Paino Yes Site Locations Pain Location: Generalized Pain, Pain in Ulcers With Dressing Change: Yes Duration of the Pain. Constant / Intermittento Intermittent Rate the pain. Current Pain Level: 6 Worst Pain Level: 10 Least Pain Level: 0 Tolerable Pain Level: 6 Character of Pain Describe the Pain: Aching Pain Management and Medication Current Pain Management: Medication: Yes Cold Application: No Rest: No Massage: No Activity: No T.MurphyN.S.: No Heat Application: No Leg drop or elevation: No Is the Current Pain Management Adequate:  Adequate How does your wound impact your activities of daily livingo Sleep: No Bathing: No Appetite: No Relationship With Others: No Bladder Continence: No Emotions: No Bowel Continence: No Work: No Toileting: No Drive: No Dressing: No Hobbies: No Electronic Signature(s) Signed: 10/03/2021 4:05:31 PM By: [name] RN Entered By: [name] on 10/03/2021 09:32:42 -------------------------------------------------------------------------------- Patient/Caregiver Education Details Patient Name: Date  of Service: [name] 6/22/2023andnbsp9:15 [place] Record Number: [id number] Patient Account Number: [id number] Date of Birth/Gender: Treating RN: October 06, 1965 (56 y.o. [name] Primary Care Physician: [name] Other Clinician: Referring Physician: Treating Physician/Extender: [name] in Treatment: 27 Education Assessment Education Provided To: Patient Education Topics Provided Wound/Skin Impairment: Methods: Explain/Verbal Responses: Reinforcements needed, State content correctly [employer]) Signed: 10/03/2021 4:05:31 PM By: [name] RN Entered By: [name] on 10/03/2021 09:47:40 -------------------------------------------------------------------------------- Wound Assessment Details Patient Name: Date of Service: [name]. 10/03/2021 9:15 A M Medical Record Number: [id number] Patient Account Number: [id number] Date of Birth/Sex: Treating RN: 1965/11/03 (56 y.o. [name], [name] Primary Care Liela Rylee: [name] Other Clinician: Referring Blaike Newburn: Treating Narmeen Kerper/Extender: [name] Weeks in Treatment: 27 Wound Status Wound Number: 1 Primary Etiology: Open Surgical Wound Wound Location: Midline Back Wound Status: Open Wounding Event: Surgical Injury Comorbid History: Hypertension, Type II Diabetes, Osteomyelitis Date Acquired: 01/11/2021 Weeks Of Treatment: 27 Clustered Wound: No Photos Wound Measurements Length: (cm) 6.7 Width: (cm) 1.2 Depth: (cm) 1 Area: (cm) 6.315 Volume: (cm) 6.315 % Reduction in Area: 91.6% % Reduction in Volume: 96.4% Epithelialization: Small (1-33%) Tunneling: Yes Position (o'clock): 12 Maximum Distance: (cm) 1.5 Wound Description Classification: Full Thickness With Exposed Support Structures Wound Margin: Distinct, outline attached Exudate Amount: Medium Exudate Type: Serosanguineous Exudate Color: red, brown Foul Odor After  Cleansing: No Slough/Fibrino Yes Wound Bed Granulation Amount: Large (67-100%) Exposed Structure Granulation Quality: Red, Pink Fascia Exposed: No Necrotic Amount: Small (1-33%) Fat Layer (Subcutaneous Tissue) Exposed: Yes Necrotic Quality: Adherent Slough Tendon Exposed: No Muscle Exposed: No Joint Exposed: No Bone Exposed: No Treatment Notes Wound #1 (Back) Wound Laterality: Midline Cleanser Peri-Wound Care Topical Primary Dressing Promogran Prisma Matrix, 4.34 (sq in) (silver collagen) Discharge Instruction: Moisten collagen with saline or hydrogel and pack into tunnel @ 12 Secondary Dressing Secured With Compression Wrap Compression Stockings Add-Ons Electronic Signature(s) Signed: 10/03/2021 4:05:31 PM By: [name] RN Signed: 10/03/2021 4:43:05 PM By: Lorrin Jackson Entered By: Lorrin Jackson on 10/03/2021 09:39:50 -------------------------------------------------------------------------------- Vitals Details Patient Name: Date of Service: Christian Brown E. 10/03/2021 9:15 A M Medical Record Number: [id number] Patient Account Number: [id number] Date of Birth/Sex: Treating RN: 1965-07-06 (56 y.o. [name] Primary Care Caterine Mcmeans: [name] Other Clinician: Referring Chrissi Crow: Treating Mollee Neer/Extender: [name] Weeks in Treatment: 27 Vital Signs Time Taken: 09:30 Temperature (F): 97.6 Pulse (bpm): 94 Respiratory Rate (breaths/min): 17 Blood Pressure (mmHg): 118/74 Reference Range: 80 - 120 mg / dl Electronic Signature(s) Signed: 10/03/2021 4:05:31 PM By: [name] RN Entered By: [name] on 10/03/2021 09:32:19

## 2021-10-03 NOTE — Progress Notes (Signed)
KEYDEN, PAVLOV ([id number]) Visit Report for 10/03/2021 Chief Complaint Document Details Patient Name: Date of Service: DAELEN, BELVEDERE 10/03/2021 9:15 A M Medical Record Number: [id number] Patient Account Number: [id number] Date of Birth/Sex: Treating RN: Apr 29, 1965 (56 y.o. [name] Primary Care Provider: [name] Other Clinician: Referring Provider: Treating Provider/Extender: [name] Weeks in Treatment: 27 Information Obtained from: Patient Chief Complaint Back wound Electronic Signature(s) Signed: 10/03/2021 12:20:21 PM By: Kalman Shan DO Entered By: Kalman Shan on 10/03/2021 12:15:14 -------------------------------------------------------------------------------- HPI Details Patient Name: Date of Service: [name] 10/03/2021 9:15 A M Medical Record Number: [id number] Patient Account Number: [id number] Date of Birth/Sex: Treating RN: 23-Nov-1965 (56 y.o. [name] Primary Care Provider: [name] Other Clinician: Referring Provider: Treating Provider/Extender: [name] Weeks in Treatment: 27 History of Present Illness HPI Description: Admission 03/28/2021 Mr. Dago Jungwirth is a 56 year old male with a past medical history of controlled type 2 diabetes on oral agents, obesity and OSA that presents to the clinic for a back wound. On 01/11/2021 patient had a laminectomy with PLIF of L1-S1 by Dr. Venetia Constable because of lumbar stenosis and radiculopathy. He subsequently developed bacteremia. He had CT imaging on 10/13 of the lumbar spine that showed fluid collection in the soft tissue of the posterior L1 and S1 and was taken to the OR for washout on 10/14. 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 He received 4 weeks of IV antibiotics by infectious disease. After his completion of 4 weeks of IV antibiotics he was continued for an additional 4 weeks of IV cefazolin with a stop date of  12/29. He has been evaluated by plastic surgery and no plans for surgical intervention at this time. Wife is present and reports he has been on the wound VAC for the past 8 weeks with improvement in wound healing. He currently denies systemic signs of infection. 12/22; patient presents for follow-up. He reports no issues since last clinic visit. He denies signs of infection. He has been tolerating the wound VAC well. 12/30; patient presents for follow-up. He reports no issues and has no complaints today. He has been tolerating the wound VAC well. 1/9; patient presents for follow-up. He has no issues or complaints today. He states he feels well. He has had no problems with the wound VAC. 1/16; patient presents for follow-up. He continues to use the wound VAC with no issues. He denies signs of infection. 1/23; patient presents for follow-up. He has been switched from IV cefazolin to oral cefadroxil by infectious disease. He has no issues or complaints today. He denies signs of infection. He continues to tolerate the wound VAC well. 1/30; patient presents for follow-up. He continues to tolerate the wound VAC well. 2/6; patient presents for follow-up. He has no issues or complaints today. He continues to tolerate the wound VAC well. He denies signs of infection. 2/13; patient presents for follow-up. He continues to do well with the wound VAC. He denies any issues. 2/27; patient presents for follow-up. He continues to use the wound VAC without any issues. He denies signs of infection. 3/20; patient presents for follow-up. He has no issues or complaints today. He continues to use the wound VAC. 4/3; patient presents for follow-up. He continues to use the wound VAC without issues. He denies signs of infection. 4/17; 2-week follow-up. He continues to do well. His measurements are improved. Initially a surgical wound complicated by infection. 5/1; patient  presents for follow-up. He has no issues or complaints  today. He continues to tolerate the wound VAC well. He denies signs of infection. 5/15; patient presents for follow-up. He has noted more maceration to the periwound. He has been using the wound VAC without issues. He currently denies signs of infection. 5/30; patient presents for follow-up. He has been tolerating the wound VAC well over the past 2 weeks. He no longer has maceration to the periwound. He has no issues or complaints today. 6/8; this patient with a postsurgical wound that was complicated by postop infection. He has been using silver collagen under wound VAC and gradually doing well improvement in dimensions especially the tunnel at 12:00 6/22; patient presents for follow-up. We have been using silver collagen under the wound VAC. Patient has no issues or questions today. Electronic Signature(s) Signed: 10/03/2021 12:20:21 PM By: Kalman Shan DO Entered By: Kalman Shan on 10/03/2021 12:15:48 -------------------------------------------------------------------------------- Physical Exam Details Patient Name: Date of Service: JHETT, FRETWELL 10/03/2021 9:15 A M Medical Record Number: [id number] Patient Account Number: [id number] Date of Birth/Sex: Treating RN: July 26, 1965 (56 y.o. [name] Primary Care Provider: [name] Other Clinician: Referring Provider: Treating Provider/Extender: [name] Weeks in Treatment: 27 Constitutional respirations regular, non-labored and within target range for patient.. Cardiovascular 2+ dorsalis pedis/posterior tibialis pulses. Psychiatric pleasant and cooperative. Notes T the lumbar spine over an incision site there is a dehisced wound with tunneling at the 12 o'clock position. Tissue looks healthy. No signs of surrounding o infection. Electronic Signature(s) Signed: 10/03/2021 12:20:21 PM By: Kalman Shan DO Entered By: Kalman Shan on 10/03/2021  12:16:32 -------------------------------------------------------------------------------- Physician Orders Details Patient Name: Date of Service: [name] 10/03/2021 9:15 A M Medical Record Number: [id number] Patient Account Number: [id number] Date of Birth/Sex: Treating RN: [birth year] (56 y.o. [name] Primary Care Provider: [name] Other Clinician: Referring Provider: Treating Provider/Extender: [name] Weeks in Treatment: 20 Verbal / Phone Orders: No Diagnosis Coding Follow-up Appointments ppointment in 2 weeks. - Thursday 10/17/21 @ 0915 w/ Dr. Heber  and [facility] Room # 9 Return A Nurse Visit: - Monday 10/07/21 @ 0915 Overflow Room # 6, Thursday 10/10/21 @ 0915 Overflow Room # 6, Monday 10/14/21 @ 0730 Room # 4 w/ Morey [facility] Bathing/ Shower/ Hygiene May shower with protection but do not get wound dressing(s) wet. Negative Presssure Wound Therapy Wound Vac to wound continuously at 151m/hg pressure - Use duoderm on periwound to prevent skin breakdown. Black Foam - wound base then bridge to right side White Foam - T tunnel at 12:00 o Additional Orders / Instructions Follow Nutritious Diet - Continue to monitor blood sugars daily Wound Treatment Wound #1 - Back Wound Laterality: Midline Peri-Wound Care: duoderm Discharge Instructions: border wound with duoderm Prim Dressing: Promogran Prisma Matrix, 4.34 (sq in) (silver collagen) ary Discharge Instructions: Moisten collagen with saline or hydrogel and pack into tunnel @ 12 Electronic Signature(s) Signed: 10/03/2021 12:20:21 PM By: HKalman ShanDO Entered By: HKalman Shanon 10/03/2021 12:16:40 -------------------------------------------------------------------------------- Problem List Details Patient Name: Date of Service: MOneal Grout 10/03/2021 9:15 A M Medical Record Number: [id number] Account Number: [id number] of Birth/Sex: Treating RN: 3Jan 09, 1967(56y.o. 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 10/03/2021 9:15 A M Medical Record Number: [id number] Patient Account Number: [id number] Date of Birth/Sex: Treating RN: 08/16/1965 (56 y.o. [name] Primary Care Provider: [name] Other Clinician: Referring Provider: Treating Provider/Extender: [name] Weeks in Treatment: 27 Subjective Chief Complaint Information obtained from Patient Back wound History of Present Illness (HPI) Admission 03/28/2021 Mr. Ryelan Kazee is a 56 year old male with a past medical history of controlled type 2 diabetes on oral agents, obesity and OSA that presents to the clinic for a back wound. On 01/11/2021 patient had a laminectomy with PLIF of L1-S1 by Dr. Venetia Constable because of lumbar stenosis and radiculopathy. He subsequently developed bacteremia. He had CT imaging on 10/13 of the lumbar spine that showed fluid collection in the soft tissue of the posterior L1 and S1 and was taken to the OR for washout on 10/14. 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 He received 4 weeks of IV antibiotics by infectious disease. After his completion of 4 weeks of IV antibiotics he was continued for an  additional 4 weeks of IV cefazolin with a stop date of 12/29. He has been evaluated by plastic surgery and no plans for surgical intervention at this time. Wife is present and reports he has been on the wound VAC for the past 8 weeks with improvement in wound healing. He currently denies systemic signs of infection. 12/22; patient presents for follow-up. He reports no issues since last clinic visit. He denies signs of infection. He has been tolerating the wound VAC well. 12/30; patient presents for follow-up. He reports no issues and has no complaints today. He has been tolerating the wound VAC well. 1/9; patient presents for follow-up. He has no issues or complaints today. He states he feels well. He has had no problems with the wound VAC. 1/16; patient presents for follow-up. He continues to use the wound VAC with no issues. He denies signs of infection. 1/23; patient presents for follow-up. He has been switched from IV cefazolin to oral cefadroxil by infectious disease. He has no issues or complaints today. He denies signs of infection. He continues to tolerate the wound VAC well. 1/30; patient presents for follow-up. He continues to tolerate the wound VAC well. 2/6; patient presents for follow-up. He has no issues or complaints today. He continues to tolerate the wound VAC well. He denies signs of infection. 2/13; patient presents for follow-up. He continues to do well with the wound VAC. He denies any issues. 2/27; patient presents for follow-up. He continues to use the wound VAC without any issues. He denies signs of infection. 3/20; patient presents for follow-up. He has no issues or complaints today. He continues to use the wound VAC. 4/3; patient presents for follow-up. He continues to use the wound VAC without issues. He denies signs of infection. 4/17; 2-week follow-up. He continues to do well. His measurements are improved. Initially a surgical wound complicated by infection. 5/1; patient  presents for follow-up. He has no issues or complaints today. He continues to tolerate the wound VAC well. He denies signs of infection. 5/15; patient presents for follow-up. He has noted more maceration to the periwound. He has been using the  wound VAC without issues. He currently denies signs of infection. 5/30; patient presents for follow-up. He has been tolerating the wound VAC well over the past 2 weeks. He no longer has maceration to the periwound. He has no issues or complaints today. 6/8; this patient with a postsurgical wound that was complicated by postop infection. He has been using silver collagen under wound VAC and gradually doing well improvement in dimensions especially the tunnel at 12:00 6/22; patient presents for follow-up. We have been using silver collagen under the wound VAC. Patient has no issues or questions today. Patient History Information obtained from Patient. Family History Diabetes - Mother, Stroke - Siblings, No family history of Cancer, Heart Disease, Hereditary Spherocytosis, Hypertension, Kidney Disease, Lung Disease, Seizures, Thyroid Problems, Tuberculosis. Social History Never smoker, Marital Status - Married, Alcohol Use - Rarely, Drug Use - No History, Caffeine Use - Rarely. Medical History Cardiovascular Patient has history of Hypertension Endocrine Patient has history of Type II Diabetes Musculoskeletal Patient has history of Osteomyelitis Medical A Surgical History Notes nd Musculoskeletal DDD Objective Constitutional respirations regular, non-labored and within target range for patient.. Vitals Time Taken: 9:30 AM, Temperature: 97.6 F, Pulse: 94 bpm, Respiratory Rate: 17 breaths/min, Blood Pressure: 118/74 mmHg. Cardiovascular 2+ dorsalis pedis/posterior tibialis pulses. Psychiatric pleasant and cooperative. General Notes: T the lumbar spine over an incision site there is a dehisced wound with tunneling at the 12 o'clock position.  Tissue looks healthy. No signs of o surrounding infection. Integumentary (Hair, Skin) Wound #1 status is Open. Original cause of wound was Surgical Injury. The date acquired was: 01/11/2021. The wound has been in treatment 27 weeks. The wound is located on the Midline Back. The wound measures 6.7cm length x 1.2cm width x 1cm depth; 6.315cm^2 area and 6.315cm^3 volume. There is Fat Layer (Subcutaneous Tissue) exposed. There is tunneling at 12:00 with a maximum distance of 1.5cm. There is a medium amount of serosanguineous drainage noted. The wound margin is distinct with the outline attached to the wound base. There is large (67-100%) red, pink granulation within the wound bed. There is a small (1-33%) amount of necrotic tissue within the wound bed including Adherent Slough. 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 The depth of the tunnel is decreasing. I recommended continuing collagen under the wound VAC. Follow-up next week. Plan Follow-up Appointments: Return Appointment in 2 weeks. - Thursday 10/17/21 @ 0915 w/ Dr. Heber [place] and [place] Room # 9 Nurse Visit: - Monday 10/07/21 @ 0915 Overflow Room # 6, Thursday 10/10/21 @ 0915 Overflow Room # 6, Monday 10/14/21 @ 0730 Room # 4 w/ Morey [facility] Bathing/ Shower/ Hygiene: May shower with protection but do not get wound dressing(s) wet. Negative Presssure Wound Therapy: Wound Vac to wound continuously at 139m/hg pressure - Use duoderm on periwound to prevent skin breakdown. Black Foam - wound base then bridge to right side White Foam - T tunnel at 12:00 o Additional Orders / Instructions: Follow Nutritious Diet - Continue to monitor blood sugars daily WOUND #1: - Back Wound Laterality: Midline Peri-Wound Care: duoderm Discharge  Instructions: border wound with duoderm Prim Dressing: Promogran Prisma Matrix, 4.34 (sq in) (silver collagen) ary Discharge Instructions: Moisten collagen with saline or hydrogel and pack into tunnel @ 12 1. Continue wound VAC Electronic Signature(s) Signed: 10/03/2021 12:20:21 PM By: HKalman ShanDO Entered  By: Kalman Shan on 10/03/2021 12:19:13 -------------------------------------------------------------------------------- HxROS Details Patient Name: Date of Service: GEMINI, BEAUMIER 10/03/2021 9:15 A M Medical Record Number: [id number] Patient Account Number: [id number] Date of Birth/Sex: Treating RN: 10-Oct-1965 (56 y.o. [name], [name] Primary Care Provider: [name] Other Clinician: Referring Provider: Treating Provider/Extender: [name] Weeks in Treatment: 27 Information Obtained From Patient Cardiovascular Medical History: Positive for: Hypertension Endocrine Medical History: Positive for: Type II Diabetes Time with diabetes: Since mid 90's Treated with: Oral agents Blood sugar tested every day: Yes Tested : 2x day Musculoskeletal Medical History: Positive for: Osteomyelitis Past Medical History Notes: DDD Immunizations Pneumococcal Vaccine: Received Pneumococcal Vaccination: Yes Received Pneumococcal Vaccination On or After 60th Birthday: No Implantable Devices Yes Family and Social History Cancer: No; Diabetes: Yes - Mother; Heart Disease: No; Hereditary Spherocytosis: No; Hypertension: No; Kidney Disease: No; Lung Disease: No; Seizures: No; Stroke: Yes - Siblings; Thyroid Problems: No; Tuberculosis: No; Never smoker; Marital Status - Married; Alcohol Use: Rarely; Drug Use: No History; Caffeine Use: Rarely; Financial Concerns: No; Food, Clothing or Shelter Needs: No; Support System Lacking: No; Transportation Concerns: No Electronic Signature(s) Signed: 10/03/2021 12:20:21 PM By: Kalman Shan DO Signed: 10/03/2021 4:05:31  PM By: [name] RN Entered By: Kalman Shan on 10/03/2021 12:15:56 -------------------------------------------------------------------------------- SuperBill Details Patient Name: Date of Service: [name] 10/03/2021 Medical Record Number: [id number] Patient Account Number: [id number] Date of Birth/Sex: Treating RN: 1965/04/23 (56 y.o. 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 M86.9 Osteomyelitis, unspecified E11.622 Type 2 diabetes mellitus with other skin ulcer Facility Procedures CPT4 Code: [id number] Description: 30076 - WOUND VAC-50 SQ CM OR LESS Modifier: Quantity: 1 Physician Procedures : CPT4 Code Description Modifier [id number] - WC PHYS LEVEL 3 - EST PT ICD-10 Diagnosis Description L98.424 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

## 2021-10-04 ENCOUNTER — Encounter (HOSPITAL_BASED_OUTPATIENT_CLINIC_OR_DEPARTMENT_OTHER): Payer: No Typology Code available for payment source | Admitting: General Surgery

## 2021-10-04 DIAGNOSIS — T8140XA Infection following a procedure, unspecified, initial encounter: Secondary | ICD-10-CM | POA: Diagnosis not present

## 2021-10-07 ENCOUNTER — Encounter (HOSPITAL_BASED_OUTPATIENT_CLINIC_OR_DEPARTMENT_OTHER): Payer: No Typology Code available for payment source | Admitting: Internal Medicine

## 2021-10-07 DIAGNOSIS — T8140XA Infection following a procedure, unspecified, initial encounter: Secondary | ICD-10-CM | POA: Diagnosis not present

## 2021-10-09 ENCOUNTER — Other Ambulatory Visit (HOSPITAL_COMMUNITY): Payer: Self-pay

## 2021-10-10 ENCOUNTER — Other Ambulatory Visit: Payer: Self-pay | Admitting: Internal Medicine

## 2021-10-10 ENCOUNTER — Other Ambulatory Visit: Payer: Self-pay | Admitting: Cardiology

## 2021-10-10 ENCOUNTER — Other Ambulatory Visit (HOSPITAL_COMMUNITY): Payer: Self-pay

## 2021-10-10 ENCOUNTER — Other Ambulatory Visit: Payer: Self-pay | Admitting: Physical Medicine and Rehabilitation

## 2021-10-10 ENCOUNTER — Encounter (HOSPITAL_BASED_OUTPATIENT_CLINIC_OR_DEPARTMENT_OTHER): Payer: No Typology Code available for payment source | Admitting: Internal Medicine

## 2021-10-10 DIAGNOSIS — I2510 Atherosclerotic heart disease of native coronary artery without angina pectoris: Secondary | ICD-10-CM

## 2021-10-10 DIAGNOSIS — T8140XA Infection following a procedure, unspecified, initial encounter: Secondary | ICD-10-CM | POA: Diagnosis not present

## 2021-10-10 MED ORDER — FREESTYLE LANCETS MISC
1.0000 | Freq: Four times a day (QID) | 0 refills | Status: DC
  Filled 2021-10-10: qty 200, 50d supply, fill #0

## 2021-10-10 MED ORDER — MORPHINE SULFATE ER 30 MG PO TBCR
30.0000 mg | EXTENDED_RELEASE_TABLET | Freq: Two times a day (BID) | ORAL | 0 refills | Status: DC
  Filled 2021-10-10 – 2021-10-17 (×3): qty 60, 30d supply, fill #0

## 2021-10-10 MED ORDER — FREESTYLE LITE W/DEVICE KIT
1.0000 | PACK | Freq: Four times a day (QID) | 0 refills | Status: DC
  Filled 2021-10-10: qty 1, 1d supply, fill #0

## 2021-10-10 MED ORDER — ROSUVASTATIN CALCIUM 20 MG PO TABS
20.0000 mg | ORAL_TABLET | Freq: Every day | ORAL | 0 refills | Status: DC
  Filled 2021-10-10 – 2021-11-25 (×5): qty 90, 90d supply, fill #0

## 2021-10-10 MED ORDER — FREESTYLE LITE TEST VI STRP
1.0000 | ORAL_STRIP | Freq: Four times a day (QID) | 0 refills | Status: DC
  Filled 2021-10-10: qty 200, 50d supply, fill #0

## 2021-10-11 NOTE — Progress Notes (Signed)
ANDREAS, SOBOLEWSKI ([id number]) Visit Report for 10/10/2021 SuperBill Details Patient Name: Date of Service: [name], [name] 10/10/2021 Medical Record Number: [id number] Patient Account Number: [id number] Date of Birth/Sex: Treating RN: [birth year] (56 y.o. 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

## 2021-10-14 ENCOUNTER — Encounter (HOSPITAL_BASED_OUTPATIENT_CLINIC_OR_DEPARTMENT_OTHER): Payer: No Typology Code available for payment source | Attending: General Surgery | Admitting: General Surgery

## 2021-10-14 DIAGNOSIS — E1169 Type 2 diabetes mellitus with other specified complication: Secondary | ICD-10-CM | POA: Diagnosis not present

## 2021-10-14 DIAGNOSIS — G4733 Obstructive sleep apnea (adult) (pediatric): Secondary | ICD-10-CM | POA: Insufficient documentation

## 2021-10-14 DIAGNOSIS — M869 Osteomyelitis, unspecified: Secondary | ICD-10-CM | POA: Insufficient documentation

## 2021-10-14 DIAGNOSIS — L98424 Non-pressure chronic ulcer of back with necrosis of bone: Secondary | ICD-10-CM | POA: Insufficient documentation

## 2021-10-14 DIAGNOSIS — E669 Obesity, unspecified: Secondary | ICD-10-CM | POA: Diagnosis not present

## 2021-10-14 DIAGNOSIS — E11622 Type 2 diabetes mellitus with other skin ulcer: Secondary | ICD-10-CM | POA: Insufficient documentation

## 2021-10-14 NOTE — Progress Notes (Signed)
KHAIDEN, SEGRETO ([id number]) Visit Report for 10/14/2021 Arrival Information Details Patient Name: Date of Service: ROBEN, SCHLIEP 10/14/2021 8:00 A M Medical Record Number: [id number] Patient Account Number: [id number] Date of Birth/Sex: Treating RN: [birth year] (56 y.o. [name] Primary Care Everlina Gotts: [name] Other Clinician: Referring Alyscia Carmon: Treating Dekker Verga/Extender: Heloise Beecham in Treatment: 28 Visit Information History Since Last Visit Added or deleted any medications: No Patient Arrived: Ambulatory Any new allergies or adverse reactions: No Arrival Time: 08:03 Had a fall or experienced change in No Accompanied By: self activities of daily living that may affect Transfer Assistance: None risk of falls: Patient Identification Verified: Yes Signs or symptoms of abuse/neglect since last visito No Secondary Verification Process Completed: Yes Hospitalized since last visit: No Patient Requires Transmission-Based Precautions: No Implantable device outside of the clinic excluding No Patient Has Alerts: Yes cellular tissue based products placed in the center Patient Alerts: Patient on Blood Thinner since last visit: No BP Right Arm-PICC Has Dressing in Place as Prescribed: Yes Pain Present Now: Yes Electronic Signature(s) Signed: 10/14/2021 11:18:51 AM By: Sharyn Creamer RN, BSN Entered By: Sharyn Creamer on 10/14/2021 08:03:48 -------------------------------------------------------------------------------- Encounter Discharge Information Details Patient Name: Date of Service: [name] 10/14/2021 8:00 A M Medical Record Number: [id number] Patient Account Number: [id number] Date of Birth/Sex: Treating RN: 1965/06/24 (56 y.o. [name] Primary Care Natlie Asfour: [name] Other Clinician: Referring Vicky Schleich: Treating Shaivi Rothschild/Extender: Heloise Beecham in Treatment: 28 Encounter Discharge Information Items Discharge  Condition: Stable Ambulatory Status: Ambulatory Discharge Destination: Home Transportation: Private Auto Accompanied By: self Schedule Follow-up Appointment: Yes Clinical Summary of Care: Patient Declined Electronic Signature(s) Signed: 10/14/2021 11:18:51 AM By: Sharyn Creamer RN, BSN Entered By: Sharyn Creamer on 10/14/2021 08:37:55 -------------------------------------------------------------------------------- Negative Pressure Wound Therapy Maintenance (NPWT) Details Patient Name: Date of Service: LINZY, DARLING 10/14/2021 8:00 A M Medical Record Number: [id number] Patient Account Number: [id number] Date of Birth/Sex: Treating RN: 1966/01/20 (56 y.o. [name] Primary Care Mayley Lish: [name] Other Clinician: Referring Analeya Luallen: Treating Montserrath Madding/Extender: Otelia Sergeant Weeks in Treatment: 28 NPWT Maintenance Performed for: Wound #1 Midline Back Performed By: Sharyn Creamer, RN Type: VAC System Coverage Size (sq cm): 8.04 Pressure Type: Constant Pressure Setting: 125 mmHG Drain Type: None Primary Contact: Non-Adherent Sponge/Dressing Type: Combination : 1 black, 1 white Date Initiated: 03/28/2021 Dressing Removed: No Quantity of Sponges/Gauze Removed: 1 white, 1 black Canister Changed: Yes Canister Exudate Volume: 20 Dressing Reapplied: Yes Quantity of Sponges/Gauze Inserted: 1 white, 1 black Respones T Treatment: o pt tolerated well Days On NPWT : 201 Electronic Signature(s) Signed: 10/14/2021 11:18:51 AM By: Sharyn Creamer RN, BSN Entered By: Sharyn Creamer on 10/14/2021 44:31:54 -------------------------------------------------------------------------------- Patient/Caregiver Education Details Patient Name: Date of Service: [name] 7/3/2023andnbsp8:00 [place] Record Number: [id number] Patient Account Number: [id number] Date of Birth/Gender: Treating RN: April 08, 1966 (56 y.o. [name] Primary Care Physician: [name] Other Clinician: Referring Physician: Treating Physician/Extender: Heloise Beecham in Treatment: 28 Education Assessment Education Provided To: Patient Education Topics Provided Wound/Skin Impairment: Methods: Explain/Verbal Responses: State content correctly [employer]) Signed: 10/14/2021 11:18:51 AM By: Sharyn Creamer RN, BSN Entered By: Sharyn Creamer on 10/14/2021 08:37:32 -------------------------------------------------------------------------------- Wound Assessment Details Patient Name: Date of Service: [name] 10/14/2021 8:00 A M Medical Record Number: [id number] Patient Account Number: [id number] Date of Birth/Sex: Treating RN: 1965-08-08 (56 y.o. [name] Primary Care Vaniya Augspurger: [name] Other  Clinician: Referring Jeweliana Dudgeon: Treating Kweku Stankey/Extender: Heloise Beecham in Treatment: 28 Wound Status Wound Number: 1 Primary Etiology: Open Surgical Wound Wound Location: Midline Back Wound Status: Open Wounding Event: Surgical Injury Comorbid History: Hypertension, Type II Diabetes, Osteomyelitis Date Acquired: 01/11/2021 Weeks Of Treatment: 28 Clustered Wound: No Wound Measurements Length: (cm) 6.7 Width: (cm) 1.2 Depth: (cm) 1 Area: (cm) 6.315 Volume: (cm) 6.315 % Reduction in Area: 91.6% % Reduction in Volume: 96.4% Epithelialization: Large (67-100%) Tunneling: Yes Position (o'clock): 12 Maximum Distance: (cm) 1.5 Undermining: No Wound Description Classification: Full Thickness With Exposed Support Structu Exudate Amount: Medium Exudate Type: Serosanguineous Exudate Color: red, brown res Wound Bed Granulation Amount: Large (67-100%) Exposed Structure Necrotic Amount: None Present (0%) Fat Layer (Subcutaneous Tissue) Exposed: Yes Treatment Notes Wound #1 (Back) Wound Laterality: Midline Cleanser Peri-Wound Care duoderm Discharge Instruction: border wound with  duoderm Topical Primary Dressing Promogran Prisma Matrix, 4.34 (sq in) (silver collagen) Discharge Instruction: Moisten collagen with saline or hydrogel and pack into tunnel @ 12 Secondary Dressing Secured With Compression Wrap Compression Stockings Add-Ons Electronic Signature(s) Signed: 10/14/2021 11:18:51 AM By: Sharyn Creamer RN, BSN Entered By: Sharyn Creamer on 10/14/2021 08:34:39 -------------------------------------------------------------------------------- [place] Details Patient Name: Date of Service: [name] 10/14/2021 8:00 A M Medical Record Number: [id number] Patient Account Number: [id number] Date of Birth/Sex: Treating RN: 19-Feb-1966 (56 y.o. [name] Primary Care Mikhala Kenan: [name] Other Clinician: Referring Shambria Camerer: Treating Euclid Cohick/Extender: Heloise Beecham in Treatment: 28 Vital Signs Time Taken: 08:05 Temperature (F): 98.0 Pulse (bpm): 92 Respiratory Rate (breaths/min): 18 Blood Pressure (mmHg): 117/76 Reference Range: 80 - 120 mg / dl Electronic Signature(s) Signed: 10/14/2021 11:18:51 AM By: Sharyn Creamer RN, BSN Entered By: Sharyn Creamer on 10/14/2021 08:07:07

## 2021-10-14 NOTE — Progress Notes (Signed)
[name], [name] ([id number]) Visit Report for 10/14/2021 [place] Details Patient Name: Date of Service: [name], [name] 10/14/2021 Medical Record Number: [id number] Patient Account Number: [id number] Date of Birth/Sex: Treating RN: 1965/10/17 (56 y.o. 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

## 2021-10-17 ENCOUNTER — Encounter (HOSPITAL_BASED_OUTPATIENT_CLINIC_OR_DEPARTMENT_OTHER): Payer: No Typology Code available for payment source | Admitting: Internal Medicine

## 2021-10-17 ENCOUNTER — Other Ambulatory Visit (HOSPITAL_COMMUNITY): Payer: Self-pay

## 2021-10-17 DIAGNOSIS — E11622 Type 2 diabetes mellitus with other skin ulcer: Secondary | ICD-10-CM

## 2021-10-17 DIAGNOSIS — L98424 Non-pressure chronic ulcer of back with necrosis of bone: Secondary | ICD-10-CM | POA: Diagnosis not present

## 2021-10-17 DIAGNOSIS — M869 Osteomyelitis, unspecified: Secondary | ICD-10-CM | POA: Diagnosis not present

## 2021-10-17 NOTE — Progress Notes (Signed)
KAMARION, ZAGAMI ([id number]) Visit Report for 10/17/2021 Chief Complaint Document Details Patient Name: Date of Service: PAPA, PIERCEFIELD 10/17/2021 9:15 A M Medical Record Number: [id number] Patient Account Number: [id number] Date of Birth/Sex: Treating RN: 02-Mar-1966 (56 y.o. [name] Primary Care Provider: [name] Other Clinician: Referring Provider: Treating Provider/Extender: [name] Weeks in Treatment: 29 Information Obtained from: Patient Chief Complaint Back wound Electronic Signature(s) Signed: 10/17/2021 12:03:16 PM By: Kalman Shan DO Entered By: Kalman Shan on 10/17/2021 11:52:44 -------------------------------------------------------------------------------- HPI Details Patient Name: Date of Service: [name] 10/17/2021 9:15 A M Medical Record Number: [id number] Patient Account Number: [id number] Date of Birth/Sex: Treating RN: [birth year] (56 y.o. [name] Primary Care Provider: [name] Other Clinician: Referring Provider: Treating Provider/Extender: [name] Weeks in Treatment: 29 History of Present Illness HPI Description: Admission 03/28/2021 Mr. Jamol Ginyard is a 56 year old male with a past medical history of controlled type 2 diabetes on oral agents, obesity and OSA that presents to the clinic for a back wound. On 01/11/2021 patient had a laminectomy with PLIF of L1-S1 by Dr. Venetia Constable because of lumbar stenosis and radiculopathy. He subsequently developed bacteremia. He had CT imaging on 10/13 of the lumbar spine that showed fluid collection in the soft tissue of the posterior L1 and S1 and was taken to the OR for washout on 10/14. 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 He received 4 weeks of IV antibiotics by infectious disease. After his completion of 4 weeks of IV antibiotics he was continued for an additional 4 weeks of IV cefazolin with a stop date of 12/29. He  has been evaluated by plastic surgery and no plans for surgical intervention at this time. Wife is present and reports he has been on the wound VAC for the past 8 weeks with improvement in wound healing. He currently denies systemic signs of infection. 12/22; patient presents for follow-up. He reports no issues since last clinic visit. He denies signs of infection. He has been tolerating the wound VAC well. 12/30; patient presents for follow-up. He reports no issues and has no complaints today. He has been tolerating the wound VAC well. 1/9; patient presents for follow-up. He has no issues or complaints today. He states he feels well. He has had no problems with the wound VAC. 1/16; patient presents for follow-up. He continues to use the wound VAC with no issues. He denies signs of infection. 1/23; patient presents for follow-up. He has been switched from IV cefazolin to oral cefadroxil by infectious disease. He has no issues or complaints today. He denies signs of infection. He continues to tolerate the wound VAC well. 1/30; patient presents for follow-up. He continues to tolerate the wound VAC well. 2/6; patient presents for follow-up. He has no issues or complaints today. He continues to tolerate the wound VAC well. He denies signs of infection. 2/13; patient presents for follow-up. He continues to do well with the wound VAC. He denies any issues. 2/27; patient presents for follow-up. He continues to use the wound VAC without any issues. He denies signs of infection. 3/20; patient presents for follow-up. He has no issues or complaints today. He continues to use the wound VAC. 4/3; patient presents for follow-up. He continues to use the wound VAC without issues. He denies signs of infection. 4/17; 2-week follow-up. He continues to do well. His measurements are improved. Initially a surgical wound complicated by infection. 5/1; patient  presents for follow-up. He has no issues or complaints today. He  continues to tolerate the wound VAC well. He denies signs of infection. 5/15; patient presents for follow-up. He has noted more maceration to the periwound. He has been using the wound VAC without issues. He currently denies signs of infection. 5/30; patient presents for follow-up. He has been tolerating the wound VAC well over the past 2 weeks. He no longer has maceration to the periwound. He has no issues or complaints today. 6/8; this patient with a postsurgical wound that was complicated by postop infection. He has been using silver collagen under wound VAC and gradually doing well improvement in dimensions especially the tunnel at 12:00 6/22; patient presents for follow-up. We have been using silver collagen under the wound VAC. Patient has no issues or questions today. 7/6; patient presents for follow-up. Patient continues to use collagen under the wound VAC with no issues. Electronic Signature(s) Signed: 10/17/2021 12:03:16 PM By: Kalman Shan DO Entered By: Kalman Shan on 10/17/2021 11:53:15 -------------------------------------------------------------------------------- Physical Exam Details Patient Name: Date of Service: RORAN, WEGNER 10/17/2021 9:15 A M Medical Record Number: [id number] Patient Account Number: [id number] Date of Birth/Sex: Treating RN: 03-Feb-1966 (56 y.o. [name] Primary Care Provider: [name] Other Clinician: Referring Provider: Treating Provider/Extender: [name] Weeks in Treatment: 29 Constitutional respirations regular, non-labored and within target range for patient.Marland Kitchen Psychiatric pleasant and cooperative. Notes T the lumbar spine over an incision site there is a dehisced wound with tunneling at the 12 o'clock position. Tissue looks healthy. No signs of surrounding o infection. Electronic Signature(s) Signed: 10/17/2021 12:03:16 PM By: Kalman Shan DO Entered By: Kalman Shan on 10/17/2021  11:54:02 -------------------------------------------------------------------------------- Physician Orders Details Patient Name: Date of Service: [name] 10/17/2021 9:15 A M Medical Record Number: [id number] Patient Account Number: [id number] Date of Birth/Sex: Treating RN: [birth year] (56 y.o. [name] Primary Care Provider: [name] Other Clinician: Referring Provider: Treating Provider/Extender: [name] Weeks in Treatment: 29 Verbal / Phone Orders: No Diagnosis Coding Follow-up Appointments ppointment in 2 weeks. - Thursday 10/31/21 @ 0915 w/ Dr. Heber  and Allayne Butcher Room # 9 Return A Nurse Visit: - [place] Room # 6: Monday 10/21/21 @ 0830, Thursday 10/24/21 @ 0915, Monday 10/28/21 @ 0915 Bathing/ Shower/ Hygiene May shower with protection but do not get wound dressing(s) wet. Negative Presssure Wound Therapy Wound Vac to wound continuously at 164m/hg pressure - Use duoderm on periwound to prevent skin breakdown. Black Foam - wound base then bridge to right side White Foam - T tunnel at 12:00 o Additional Orders / Instructions Follow Nutritious Diet - Continue to monitor blood sugars daily Wound Treatment Wound #1 - Back Wound Laterality: Midline Peri-Wound Care: duoderm Discharge Instructions: border wound with duoderm Prim Dressing: Promogran Prisma Matrix, 4.34 (sq in) (silver collagen) ary Discharge Instructions: Moisten collagen with saline or hydrogel and pack into tunnel @ 12 Electronic Signature(s) Signed: 10/17/2021 12:03:16 PM By: HKalman ShanDO Entered By: HKalman Shanon 10/17/2021 11:54:10 -------------------------------------------------------------------------------- Problem List Details Patient Name: Date of Service: MOneal Grout 10/17/2021 9:15 A M Medical Record Number: [id number] Account Number: [id number] of Birth/Sex: Treating RN: [birth year](56y.o. 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 10/17/2021 9:15 A M Medical Record Number: [id number] Patient Account Number: [id number] Date of Birth/Sex: Treating RN: 1965-10-04 (56 y.o. [name] Primary Care Provider: [name] Other Clinician: Referring Provider: Treating Provider/Extender: [name] Weeks in Treatment: 29 Subjective Chief Complaint Information obtained from Patient Back wound History of Present Illness (HPI) Admission 03/28/2021 Mr. Gerhard Rappaport is a 56 year old male with a past medical history of controlled type 2 diabetes on oral agents, obesity and OSA that presents to the clinic for a back wound. On 01/11/2021 patient had a laminectomy with PLIF of L1-S1 by Dr. Venetia Constable because of lumbar stenosis and radiculopathy. He subsequently developed bacteremia. He had CT imaging on 10/13 of the lumbar spine that showed fluid collection in the soft tissue of the posterior L1 and S1 and was taken to the OR for washout on 10/14. 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 He received 4 weeks of IV antibiotics by infectious disease. After his completion of 4 weeks of IV antibiotics he was continued for an additional 4 weeks of IV cefazolin with a stop  date of 12/29. He has been evaluated by plastic surgery and no plans for surgical intervention at this time. Wife is present and reports he has been on the wound VAC for the past 8 weeks with improvement in wound healing. He currently denies systemic signs of infection. 12/22; patient presents for follow-up. He reports no issues since last clinic visit. He denies signs of infection. He has been tolerating the wound VAC well. 12/30; patient presents for follow-up. He reports no issues and has no complaints today. He has been tolerating the wound VAC well. 1/9; patient presents for follow-up. He has no issues or complaints today. He states he feels well. He has had no problems with the wound VAC. 1/16; patient presents for follow-up. He continues to use the wound VAC with no issues. He denies signs of infection. 1/23; patient presents for follow-up. He has been switched from IV cefazolin to oral cefadroxil by infectious disease. He has no issues or complaints today. He denies signs of infection. He continues to tolerate the wound VAC well. 1/30; patient presents for follow-up. He continues to tolerate the wound VAC well. 2/6; patient presents for follow-up. He has no issues or complaints today. He continues to tolerate the wound VAC well. He denies signs of infection. 2/13; patient presents for follow-up. He continues to do well with the wound VAC. He denies any issues. 2/27; patient presents for follow-up. He continues to use the wound VAC without any issues. He denies signs of infection. 3/20; patient presents for follow-up. He has no issues or complaints today. He continues to use the wound VAC. 4/3; patient presents for follow-up. He continues to use the wound VAC without issues. He denies signs of infection. 4/17; 2-week follow-up. He continues to do well. His measurements are improved. Initially a surgical wound complicated by infection. 5/1; patient presents for follow-up. He has no issues or  complaints today. He continues to tolerate the wound VAC well. He denies signs of infection. 5/15; patient presents for follow-up. He has noted more maceration to the periwound. He has been  using the wound VAC without issues. He currently denies signs of infection. 5/30; patient presents for follow-up. He has been tolerating the wound VAC well over the past 2 weeks. He no longer has maceration to the periwound. He has no issues or complaints today. 6/8; this patient with a postsurgical wound that was complicated by postop infection. He has been using silver collagen under wound VAC and gradually doing well improvement in dimensions especially the tunnel at 12:00 6/22; patient presents for follow-up. We have been using silver collagen under the wound VAC. Patient has no issues or questions today. 7/6; patient presents for follow-up. Patient continues to use collagen under the wound VAC with no issues. Patient History Information obtained from Patient. Family History Diabetes - Mother, Stroke - Siblings, No family history of Cancer, Heart Disease, Hereditary Spherocytosis, Hypertension, Kidney Disease, Lung Disease, Seizures, Thyroid Problems, Tuberculosis. Social History Never smoker, Marital Status - Married, Alcohol Use - Rarely, Drug Use - No History, Caffeine Use - Rarely. Medical History Cardiovascular Patient has history of Hypertension Endocrine Patient has history of Type II Diabetes Musculoskeletal Patient has history of Osteomyelitis Medical A Surgical History Notes nd Musculoskeletal DDD Objective Constitutional respirations regular, non-labored and within target range for patient.. Vitals Time Taken: 9:26 AM, Temperature: 98 F, Pulse: 97 bpm, Respiratory Rate: 19 breaths/min, Blood Pressure: 138/79 mmHg. Psychiatric pleasant and cooperative. General Notes: T the lumbar spine over an incision site there is a dehisced wound with tunneling at the 12 o'clock position.  Tissue looks healthy. No signs of o surrounding infection. Integumentary (Hair, Skin) Wound #1 status is Open. Original cause of wound was Surgical Injury. The date acquired was: 01/11/2021. The wound has been in treatment 29 weeks. The wound is located on the Midline Back. The wound measures 6.5cm length x 1.4cm width x 0.6cm depth; 7.147cm^2 area and 4.288cm^3 volume. There is Fat Layer (Subcutaneous Tissue) exposed. There is tunneling at 12:00 with a maximum distance of 1.8cm. There is a medium amount of serosanguineous drainage noted. There is large (67-100%) granulation within the wound bed. There is no necrotic tissue within the wound bed. 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 I recommended continuing the wound VAC with collagen. Follow-up in 2 weeks. Continue wound VAC changes in our office with nurse visits. Plan Follow-up Appointments: Return Appointment in 2 weeks. - Thursday 10/31/21 @ 0915 w/ Dr. Heber [place] and Allayne Butcher Room # 9 Nurse Visit: - [place] Room # 6: Monday 10/21/21 @ 0830, Thursday 10/24/21 @ 0915, Monday 10/28/21 @ 0915 Bathing/ Shower/ Hygiene: May shower with protection but do not get wound dressing(s) wet. Negative Presssure Wound Therapy: Wound Vac to wound continuously at 136m/hg pressure - Use duoderm on periwound to prevent skin breakdown. Black Foam - wound base then bridge to right side White Foam - T tunnel at 12:00 o Additional Orders / Instructions: Follow Nutritious Diet - Continue to monitor blood sugars daily WOUND #1: - Back Wound Laterality: Midline Peri-Wound Care: duoderm Discharge Instructions: border wound with duoderm Prim Dressing: Promogran Prisma Matrix, 4.34 (sq in) (silver collagen) ary Discharge Instructions: Moisten collagen with saline or  hydrogel and pack into tunnel @ 12 1. Continue collagen with wound VAC 2. Follow-up in 2 weeks 3. [facility] Electronic Signature(s) Signed: 10/17/2021 12:03:16 PM By: HKalman ShanDO Entered By: HKalman Shan  on 10/17/2021 11:54:52 -------------------------------------------------------------------------------- HxROS Details Patient Name: Date of Service: JERRI, HARGADON 10/17/2021 9:15 A M Medical Record Number: [id number] Patient Account Number: [id number] Date of Birth/Sex: Treating RN: 1966/03/27 (56 y.o. [name], [name] Primary Care Provider: [name] Other Clinician: Referring Provider: Treating Provider/Extender: [name] Weeks in Treatment: 29 Information Obtained From Patient Cardiovascular Medical History: Positive for: Hypertension Endocrine Medical History: Positive for: Type II Diabetes Time with diabetes: Since mid 90's Treated with: Oral agents Blood sugar tested every day: Yes Tested : 2x day Musculoskeletal Medical History: Positive for: Osteomyelitis Past Medical History Notes: DDD Immunizations Pneumococcal Vaccine: Received Pneumococcal Vaccination: Yes Received Pneumococcal Vaccination On or After 60th Birthday: No Implantable Devices Yes Family and Social History Cancer: No; Diabetes: Yes - Mother; Heart Disease: No; Hereditary Spherocytosis: No; Hypertension: No; Kidney Disease: No; Lung Disease: No; Seizures: No; Stroke: Yes - Siblings; Thyroid Problems: No; Tuberculosis: No; Never smoker; Marital Status - Married; Alcohol Use: Rarely; Drug Use: No History; Caffeine Use: Rarely; Financial Concerns: No; Food, Clothing or Shelter Needs: No; Support System Lacking: No; Transportation Concerns: No Electronic Signature(s) Signed: 10/17/2021 12:03:16 PM By: Kalman Shan DO Signed: 10/17/2021 4:05:15 PM By: [name] RN Entered By: Kalman Shan on 10/17/2021  11:53:20 -------------------------------------------------------------------------------- SuperBill Details Patient Name: Date of Service: [name] 10/17/2021 Medical Record Number: [id number] Patient Account Number: [id number] Date of Birth/Sex: Treating RN: 17-Apr-1965 (56 y.o. 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 M86.9 Osteomyelitis, unspecified E11.622 Type 2 diabetes mellitus with other skin ulcer Facility Procedures CPT4 Code: [id number] Description: 7605 - WOUND VAC-50 SQ CM OR LESS Modifier: Quantity: 1 Physician Procedures : CPT4 Code Description Modifier [id number] - WC PHYS LEVEL 3 - EST PT ICD-10 Diagnosis Description L98.424 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

## 2021-10-17 NOTE — Progress Notes (Signed)
BRONCO, MCGRORY ([id number]) Visit Report for 10/17/2021 Arrival Information Details Patient Name: Date of Service: [name], [name] 10/17/2021 9:15 A M Medical Record Number: [id number] Patient Account Number: [id number] Date of Birth/Sex: Treating RN: [birth year] (56 y.o. [name], [name] Primary Care Rondy Krupinski: [name] Other Clinician: Referring Adelaida Reindel: Treating Anish Vana/Extender: [name] in Treatment: 29 Visit Information History Since Last Visit Added or deleted any medications: No Patient Arrived: Ambulatory Any new allergies or adverse reactions: No Arrival Time: 09:25 Had a fall or experienced change in No Accompanied By: self activities of daily living that may affect Transfer Assistance: None risk of falls: Patient Identification Verified: Yes Signs or symptoms of abuse/neglect since last visito No Secondary Verification Process Completed: Yes Hospitalized since last visit: No Patient Requires Transmission-Based Precautions: No Implantable device outside of the clinic excluding No Patient Has Alerts: Yes cellular tissue based products placed in the center Patient Alerts: Patient on Blood Thinner since last visit: No BP Right Arm-PICC Has Dressing in Place as Prescribed: Yes Pain Present Now: Yes Electronic Signature(s) Signed: 10/17/2021 4:10:18 PM By: Erenest Blank Entered By: Erenest Blank on 10/17/2021 09:26:25 -------------------------------------------------------------------------------- Encounter Discharge Information Details Patient Name: Date of Service: Christian Brown E. 10/17/2021 9:15 A M Medical Record Number: [id number] Patient Account Number: [id number] Date of Birth/Sex: Treating RN: [birth year] (56 y.o. [name], [name] Primary Care Jivan Symanski: [name] Other Clinician: Referring Yola Paradiso: Treating Greg Eckrich/Extender: [name] Weeks in Treatment: 29 Encounter Discharge Information Items Discharge  Condition: Stable Ambulatory Status: Ambulatory Discharge Destination: Home Transportation: Private Auto Accompanied By: self Schedule Follow-up Appointment: Yes Clinical Summary of Care: Patient Declined Electronic Signature(s) Signed: 10/17/2021 4:05:15 PM By: [name] RN Entered By: [name] on 10/17/2021 11:58:11 -------------------------------------------------------------------------------- Lower Extremity Assessment Details Patient Name: Date of Service: Christian Grout. 10/17/2021 9:15 A M Medical Record Number: [id number] Patient Account Number: [id number] Date of Birth/Sex: Treating RN: 10-25-1965 (56 y.o. [name] Primary Care Andrei Mccook: [name] Other Clinician: Referring Ayman Brull: Treating Tara Wich/Extender: [name] Weeks in Treatment: 29 Electronic Signature(s) Signed: 10/17/2021 4:05:15 PM By: [name] RN Signed: 10/17/2021 4:10:18 PM By: Erenest Blank Entered By: Erenest Blank on 10/17/2021 01:60:10 -------------------------------------------------------------------------------- Multi Wound Chart Details Patient Name: Date of Service: Christian Brown E. 10/17/2021 9:15 A M Medical Record Number: [id number] Patient Account Number: [id number] Date of Birth/Sex: Treating RN: June 02, 1965 (56 y.o. [name], [name] Primary Care Kalese Ensz: [name] Other Clinician: Referring Anyeli Hockenbury: Treating Aleeah Greeno/Extender: [name] Weeks in Treatment: 29 Vital Signs Height(in): Pulse(bpm): 97 Weight(lbs): Blood Pressure(mmHg): 138/79 Body Mass Index(BMI): Temperature(F): 98 Respiratory Rate(breaths/min): 19 Photos: [N/A:N/A] Midline Back N/A N/A Wound Location: Surgical Injury N/A N/A Wounding Event: Open Surgical Wound N/A N/A Primary Etiology: Hypertension, Type II Diabetes, N/A N/A Comorbid History: Osteomyelitis 01/11/2021 N/A N/A Date Acquired: 29 N/A N/A Weeks of Treatment: Open N/A  N/A Wound Status: No N/A N/A Wound Recurrence: 6.5x1.4x0.6 N/A N/A Measurements L x W x D (cm) 7.147 N/A N/A A (cm) : rea 4.288 N/A N/A Volume (cm) : 90.50% N/A N/A % Reduction in A rea: 97.50% N/A N/A % Reduction in Volume: 12 Position 1 (o'clock): 1.8 Maximum Distance 1 (cm): Yes N/A N/A Tunneling: Full Thickness With Exposed Support N/A N/A Classification: Structures Medium N/A N/A Exudate Amount: Serosanguineous N/A N/A Exudate Type: red, brown N/A N/A Exudate Color: Large (67-100%) N/A N/A Granulation A mount: None Present (0%) N/A N/A Necrotic A mount: Fat Layer (  Subcutaneous Tissue): Yes N/A N/A Exposed Structures: Large (67-100%) N/A N/A Epithelialization: Negative Pressure Wound Therapy N/A N/A Procedures Performed: Maintenance (NPWT) Treatment Notes Electronic Signature(s) Signed: 10/17/2021 12:03:16 PM By: Kalman Shan DO Signed: 10/17/2021 4:05:15 PM By: [name] RN Entered By: Kalman Shan on 10/17/2021 11:52:28 -------------------------------------------------------------------------------- Multi-Disciplinary Care Plan Details Patient Name: Date of Service: Christian Grout. 10/17/2021 9:15 A M Medical Record Number: [id number] Patient Account Number: [id number] Date of Birth/Sex: Treating RN: [birth year] (56 y.o. [name], [name] Primary Care Kamerin Axford: [name] Other Clinician: Referring Catelyn Friel: Treating Hilario Robarts/Extender: [name] Weeks in Treatment: [place] reviewed with physician Active Inactive Wound/Skin Impairment Nursing Diagnoses: Impaired tissue integrity Goals: Patient/caregiver will verbalize understanding of skin care regimen Date Initiated: 03/28/2021 Target Resolution Date: 11/09/2021 Goal Status: Active Ulcer/skin breakdown will have a volume reduction of 30% by week 4 Date Initiated: 03/28/2021 Date Inactivated: 05/27/2021 Target Resolution Date:  05/31/2021 Goal Status: Met Interventions: Assess patient/caregiver ability to obtain necessary supplies Assess patient/caregiver ability to perform ulcer/skin care regimen upon admission and as needed Assess ulceration(s) every visit Provide education on ulcer and skin care Treatment Activities: Topical wound management initiated : 03/28/2021 Notes: Electronic Signature(s) Signed: 10/17/2021 4:05:15 PM By: [name] RN Entered By: [name] on 10/17/2021 10:11:07 -------------------------------------------------------------------------------- Negative Pressure Wound Therapy Maintenance (NPWT) Details Patient Name: Date of Service: [name], [name] 10/17/2021 9:15 A M Medical Record Number: [id number] Patient Account Number: [id number] Date of Birth/Sex: Treating RN: Oct 28, 1965 (56 y.o. [name], [name] Primary Care Eran Windish: [name] Other Clinician: Referring Niquita Digioia: Treating Shelli Portilla/Extender: [name] Weeks in Treatment: 29 NPWT Maintenance Performed for: Wound #1 Midline Back Performed By: [name], RN Type: VAC System Coverage Size (sq cm): 9.1 Pressure Type: Constant Pressure Setting: 125 mmHG Drain Type: None Primary Contact: Non-Adherent Sponge/Dressing Type: Combination : 1 WHITE FOAM 1 BLACK FOAM Date Initiated: 03/28/2021 Dressing Removed: Yes Quantity of Sponges/Gauze Removed: 1 WHITE 1 BLACK Canister Changed: Yes Canister Exudate Volume: 50 Dressing Reapplied: Yes Quantity of Sponges/Gauze Inserted: 1 WHITE 1 BLACK Respones T Treatment: o TOLERATES WELL Days On NPWT : 204 Post Procedure Diagnosis Same as Pre-procedure Electronic Signature(s) Signed: 10/17/2021 4:05:15 PM By: [name] RN Entered By: [name] on 10/17/2021 10:11:50 -------------------------------------------------------------------------------- Pain Assessment Details Patient Name: Date of Service: Christian Grout. 10/17/2021  9:15 A M Medical Record Number: [id number] Patient Account Number: [id number] Date of Birth/Sex: Treating RN: [birth year] (56 y.o. [name] Primary Care Ajia Chadderdon: [name] Other Clinician: Referring Itzell Bendavid: Treating Charbel Los/Extender: [name] Weeks in Treatment: 29 Active Problems Location of Pain Severity and Description of Pain Patient Has Paino Yes Site Locations Pain Location: Pain in Ulcers Rate the pain. Current Pain Level: 7 Pain Management and Medication Current Pain Management: Electronic Signature(s) Signed: 10/17/2021 4:05:15 PM By: [name] RN Signed: 10/17/2021 4:10:18 PM By: Erenest Blank Entered By: Erenest Blank on 10/17/2021 09:27:13 -------------------------------------------------------------------------------- Patient/Caregiver Education Details Patient Name: Date of Service: Christian Grout 7/6/2023andnbsp9:15 A M Medical Record Number: [id number] Patient Account Number: [id number] Date of Birth/Gender: Treating RN: Dec 12, 1965 (56 y.o. [name] Primary Care Physician: [name] Other Clinician: Referring Physician: Treating Physician/Extender: [name] in Treatment: 29 Education Assessment Education Provided To: Patient Education Topics Provided Wound/Skin Impairment: Methods: Explain/Verbal Responses: Reinforcements needed, State content correctly Electronic Signature(s) Signed: 10/17/2021 4:05:15 PM By: [name] RN Entered By: [name] on 10/17/2021 11:57:21 -------------------------------------------------------------------------------- Wound Assessment  Details Patient Name: Date of Service: [name], [name] 10/17/2021 9:15 A M Medical Record Number: [id number] Patient Account Number: [id number] Date of Birth/Sex: Treating RN: [birth year] (56 y.o. [name], [place] Primary Care Betzayda Braxton: [name] Other Clinician: Referring Amen Staszak: Treating  Charleston Hankin/Extender: [name] Weeks in Treatment: 29 Wound Status Wound Number: 1 Primary Etiology: Open Surgical Wound Wound Location: Midline Back Wound Status: Open Wounding Event: Surgical Injury Comorbid History: Hypertension, Type II Diabetes, Osteomyelitis Date Acquired: 01/11/2021 Weeks Of Treatment: 29 Clustered Wound: No Photos Wound Measurements Length: (cm) 6.5 Width: (cm) 1.4 Depth: (cm) 0.6 Area: (cm) 7.147 Volume: (cm) 4.288 % Reduction in Area: 90.5% % Reduction in Volume: 97.5% Epithelialization: Large (67-100%) Tunneling: Yes Position (o'clock): 12 Maximum Distance: (cm) 1.8 Wound Description Classification: Full Thickness With Exposed Support Structu Exudate Amount: Medium Exudate Type: Serosanguineous Exudate Color: red, brown res Wound Bed Granulation Amount: Large (67-100%) Exposed Structure Necrotic Amount: None Present (0%) Fat Layer (Subcutaneous Tissue) Exposed: Yes Treatment Notes Wound #1 (Back) Wound Laterality: Midline Cleanser Peri-Wound Care duoderm Discharge Instruction: border wound with duoderm Topical Primary Dressing Promogran Prisma Matrix, 4.34 (sq in) (silver collagen) Discharge Instruction: Moisten collagen with saline or hydrogel and pack into tunnel @ 12 Secondary Dressing Secured With Compression Wrap Compression Stockings Add-Ons Electronic Signature(s) Signed: 10/17/2021 4:05:15 PM By: [name] RN Signed: 10/17/2021 4:10:18 PM By: Erenest Blank Entered By: Erenest Blank on 10/17/2021 09:42:35 -------------------------------------------------------------------------------- Vitals Details Patient Name: Date of Service: Christian Brown E. 10/17/2021 9:15 A M Medical Record Number: [id number] Patient Account Number: [id number] Date of Birth/Sex: Treating RN: 1965/10/07 (56 y.o. [name] Primary Care Maecie Sevcik: [name] Other Clinician: Referring Synai Prettyman: Treating  Jabori Henegar/Extender: [name] Weeks in Treatment: 29 Vital Signs Time Taken: 09:26 Temperature (F): 98 Pulse (bpm): 97 Respiratory Rate (breaths/min): 19 Blood Pressure (mmHg): 138/79 Reference Range: 80 - 120 mg / dl Electronic Signature(s) Signed: 10/17/2021 4:10:18 PM By: Erenest Blank Entered By: Erenest Blank on 10/17/2021 56:86:16

## 2021-10-18 ENCOUNTER — Other Ambulatory Visit (HOSPITAL_COMMUNITY): Payer: Self-pay

## 2021-10-21 ENCOUNTER — Encounter (HOSPITAL_BASED_OUTPATIENT_CLINIC_OR_DEPARTMENT_OTHER): Payer: No Typology Code available for payment source | Admitting: Internal Medicine

## 2021-10-21 DIAGNOSIS — E11622 Type 2 diabetes mellitus with other skin ulcer: Secondary | ICD-10-CM | POA: Diagnosis not present

## 2021-10-22 NOTE — Progress Notes (Signed)
ALLAH, REASON ([id number]) Visit Report for 10/21/2021 Arrival Information Details Patient Name: Date of Service: [name], [name] 10/21/2021 8:30 A M Medical Record Number: [id number] Patient Account Number: [id number] Date of Birth/Sex: Treating RN: [birth year] (56 y.o. [name], [name] Primary Care Kristianna Saperstein: [name] Other Clinician: Referring Mai Longnecker: Treating Kasir Hallenbeck/Extender: [name] in Treatment: 29 Visit Information History Since Last Visit Added or deleted any medications: No Patient Arrived: Ambulatory Any new allergies or adverse reactions: No Arrival Time: 08:30 Had a fall or experienced change in No Accompanied By: self activities of daily living that may affect Transfer Assistance: None risk of falls: Patient Identification Verified: Yes Signs or symptoms of abuse/neglect since last visito No Secondary Verification Process Completed: Yes Hospitalized since last visit: No Patient Requires Transmission-Based Precautions: No Implantable device outside of the clinic excluding No Patient Has Alerts: Yes cellular tissue based products placed in the center Patient Alerts: Patient on Blood Thinner since last visit: No BP Right Arm-PICC Has Dressing in Place as Prescribed: Yes Pain Present Now: No Electronic Signature(s) Signed: 10/22/2021 10:34:18 AM By: Erenest Blank Entered By: Erenest Blank on 10/21/2021 08:32:27 -------------------------------------------------------------------------------- Encounter Discharge Information Details Patient Name: Date of Service: Christian Brown E. 10/21/2021 8:30 A M Medical Record Number: [id number] Patient Account Number: [id number] Date of Birth/Sex: Treating RN: Sep 03, 1965 (56 y.o. [name], [name] Primary Care Nyella Eckels: [name] Other Clinician: Referring Yasuko Lapage: Treating Kaaliyah Kita/Extender: [name] Weeks in Treatment: 29 Encounter Discharge Information Items Discharge  Condition: Stable Ambulatory Status: Ambulatory Discharge Destination: Home Transportation: Private Auto Accompanied By: self Schedule Follow-up Appointment: Yes Clinical Summary of Care: Electronic Signature(s) Signed: 10/22/2021 10:34:18 AM By: Erenest Blank Entered By: Erenest Blank on 10/21/2021 12:08:58 -------------------------------------------------------------------------------- Negative Pressure Wound Therapy Maintenance (NPWT) Details Patient Name: Date of Service: [name] 10/21/2021 8:30 A M Medical Record Number: [id number] Patient Account Number: [id number] Date of Birth/Sex: Treating RN: [birth year] (56 y.o. [name], [name] Primary Care Gennie Dib: [name] Other Clinician: Referring Brendaly Townsel: Treating Kegan Shepardson/Extender: [name] Weeks in Treatment: 29 NPWT Maintenance Performed for: Wound #1 Midline Back Performed By: [name], RN Type: VAC System Coverage Size (sq cm): 9.1 Pressure Type: Constant Pressure Setting: 125 mmHG Drain Type: None Primary Contact: Non-Adherent Sponge/Dressing Type: Combination : white and black foam Date Initiated: 03/28/2021 Dressing Removed: Yes Quantity of Sponges/Gauze Removed: 1 white and 1 black Canister Changed: No Dressing Reapplied: Yes Quantity of Sponges/Gauze Inserted: 1 white and 1 black Respones T Treatment: o tolerated well Days On NPWT : 208 Electronic Signature(s) Signed: 10/22/2021 10:34:18 AM By: Erenest Blank Entered By: Erenest Blank on 10/21/2021 12:07:54 -------------------------------------------------------------------------------- Patient/Caregiver Education Details Patient Name: Date of Service: [name] 7/10/2023andnbsp8:30 [place] Record Number: [id number] Patient Account Number: [id number] Date of Birth/Gender: Treating RN: 1965-09-20 (56 y.o. [name] Primary Care Physician: [name] Other Clinician: Referring  Physician: Treating Physician/Extender: [name] in Treatment: 29 Education Assessment Education Provided To: Patient Education Topics Provided Electronic Signature(s) Signed: 10/22/2021 10:34:18 AM By: Erenest Blank Entered By: Erenest Blank on 10/21/2021 12:08:37 -------------------------------------------------------------------------------- Wound Assessment Details Patient Name: Date of Service: Christian Brown E. 10/21/2021 8:30 A M Medical Record Number: [id number] Patient Account Number: [id number] Date of Birth/Sex: Treating RN: [birth year] (56 y.o. [name] Primary Care Braxtin Bamba: [name] Other Clinician: Referring Lawson Isabell: Treating Geoffrey Hynes/Extender: [name] Weeks in Treatment: 29 Wound Status Wound Number: 1 Primary Etiology: Open  Surgical Wound Wound Location: Midline Back Wound Status: Open Wounding Event: Surgical Injury Date Acquired: 01/11/2021 Weeks Of Treatment: 29 Clustered Wound: No Wound Measurements Length: (cm) 6.5 Width: (cm) 1.4 Depth: (cm) 0.6 Area: (cm) 7.147 Volume: (cm) 4.288 % Reduction in Area: 90.5% % Reduction in Volume: 97.5% Wound Description Classification: Full Thickness With Exposed Support Structure Exudate Amount: Medium Exudate Type: Serosanguineous Exudate Color: red, brown s Treatment Notes Wound #1 (Back) Wound Laterality: Midline Cleanser Peri-Wound Care duoderm Discharge Instruction: border wound with duoderm Topical Primary Dressing Promogran Prisma Matrix, 4.34 (sq in) (silver collagen) Discharge Instruction: Moisten collagen with saline or hydrogel and pack into tunnel @ 12 Secondary Dressing Secured With Compression Wrap Compression Stockings Add-Ons Electronic Signature(s) Signed: 10/21/2021 4:44:20 PM By: [name] RN Signed: 10/22/2021 10:34:18 AM By: Erenest Blank Entered By: Erenest Blank on 10/21/2021  08:34:36 -------------------------------------------------------------------------------- Vitals Details Patient Name: Date of Service: Christian Brown E. 10/21/2021 8:30 A M Medical Record Number: [id number] Patient Account Number: [id number] Date of Birth/Sex: Treating RN: 06/27/1965 (56 y.o. [name] Primary Care Nikolaj Geraghty: [name] Other Clinician: Referring Renella Steig: Treating Tamarion Haymond/Extender: [name] Weeks in Treatment: 29 Vital Signs Time Taken: 08:32 Temperature (F): 97.9 Pulse (bpm): 91 Respiratory Rate (breaths/min): 18 Blood Pressure (mmHg): 128/78 Reference Range: 80 - 120 mg / dl Electronic Signature(s) Signed: 10/22/2021 10:34:18 AM By: Erenest Blank Entered By: Erenest Blank on 10/21/2021 08:34:20

## 2021-10-22 NOTE — Progress Notes (Signed)
[name], [name] ([id number]) Visit Report for 10/21/2021 SuperBill Details Patient Name: Date of Service: [name], [name] 10/21/2021 Medical Record Number: [id number] Patient Account Number: [id number] Date of Birth/Sex: Treating RN: [birth year] (56 y.o. 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

## 2021-10-24 ENCOUNTER — Encounter (HOSPITAL_BASED_OUTPATIENT_CLINIC_OR_DEPARTMENT_OTHER): Payer: No Typology Code available for payment source | Admitting: Internal Medicine

## 2021-10-24 DIAGNOSIS — E11622 Type 2 diabetes mellitus with other skin ulcer: Secondary | ICD-10-CM | POA: Diagnosis not present

## 2021-10-24 NOTE — Progress Notes (Signed)
ABEM, SHADDIX ([id number]) Visit Report for 10/24/2021 SuperBill Details Patient Name: Date of Service: [name], [name] 10/24/2021 Medical Record Number: [id number] Patient Account Number: [id number] Date of Birth/Sex: Treating RN: 30-Aug-1965 (56 y.o. 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

## 2021-10-25 NOTE — Progress Notes (Signed)
[name] ([id number]) Visit Report for 10/24/2021 Arrival Information Details Patient Name: Date of Service: [name], [name] 10/24/2021 9:15 A M Medical Record Number: [id number] Patient Account Number: [id number] Date of Birth/Sex: Treating RN: 08-06-1965 (56 y.o. [name], [name] Primary Care Najat Olazabal: [name] Other Clinician: Referring Davin Archuletta: Treating Nataliee Shurtz/Extender: [name] Weeks in Treatment: 30 Visit Information History Since Last Visit Added or deleted any medications: No Patient Arrived: Ambulatory Any new allergies or adverse reactions: No Arrival Time: 09:32 Had a fall or experienced change in No Accompanied By: self activities of daily living that may affect Transfer Assistance: None risk of falls: Patient Identification Verified: Yes Signs or symptoms of abuse/neglect since last visito No Secondary Verification Process Completed: Yes Hospitalized since last visit: No Patient Requires Transmission-Based Precautions: No Implantable device outside of the clinic excluding No Patient Has Alerts: Yes cellular tissue based products placed in the center Patient Alerts: Patient on Blood Thinner since last visit: No BP Right Arm-PICC Has Dressing in Place as Prescribed: Yes Pain Present Now: No Electronic Signature(s) Signed: 10/24/2021 4:37:01 PM By: Erenest Blank Entered By: Erenest Blank on 10/24/2021 09:32:48 -------------------------------------------------------------------------------- Encounter Discharge Information Details Patient Name: Date of Service: Christian Brown E. 10/24/2021 9:15 A M Medical Record Number: [id number] Patient Account Number: [id number] Date of Birth/Sex: Treating RN: 10/10/1965 (56 y.o. [name], [name] Primary Care Krimson Massmann: [name] Other Clinician: Referring Jacoby Ritsema: Treating Timmothy Baranowski/Extender: [name] Weeks in Treatment: 30 Encounter Discharge Information Items Discharge  Condition: Stable Ambulatory Status: Ambulatory Discharge Destination: Home Transportation: Private Auto Schedule Follow-up Appointment: Yes Clinical Summary of Care: Electronic Signature(s) Signed: 10/24/2021 4:37:01 PM By: Erenest Blank Entered By: Erenest Blank on 10/24/2021 15:51:19 -------------------------------------------------------------------------------- Negative Pressure Wound Therapy Maintenance (NPWT) Details Patient Name: Date of Service: [name] 10/24/2021 9:15 A M Medical Record Number: [id number] Patient Account Number: [id number] Date of Birth/Sex: Treating RN: [birth year] (56 y.o. [name], [name] Primary Care Taiten [name]: [name] Other Clinician: Referring Mariadel Mruk: Treating Aloma Boch/Extender: [name] Weeks in Treatment: 30 NPWT Maintenance Performed for: Wound #1 Midline Back Performed By: [name], RN Type: VAC System Coverage Size (sq cm): 9.1 Pressure Type: Constant Pressure Setting: 125 mmHG Drain Type: None Primary Contact: Non-Adherent Sponge/Dressing Type: Combination : white and black foam Date Initiated: 03/28/2021 Dressing Removed: No Quantity of Sponges/Gauze Removed: 1 white and 1 black Canister Changed: No Dressing Reapplied: No Quantity of Sponges/Gauze Inserted: 1 white and 1 black Respones T Treatment: o tolerated well Days On NPWT : 211 Electronic Signature(s) Signed: 10/24/2021 4:37:01 PM By: Erenest Blank Entered By: Erenest Blank on 10/24/2021 15:49:57 -------------------------------------------------------------------------------- Patient/Caregiver Education Details Patient Name: Date of Service: [name] 7/13/2023andnbsp9:15 [place] Record Number: [id number] Patient Account Number: [id number] Date of Birth/Gender: Treating RN: [birth year] (56 y.o. [name] Primary Care Physician: [name] Other Clinician: Referring Physician: Treating Physician/Extender:  [name] in Treatment: 30 Education Assessment Education Provided To: Patient Education Topics Provided Electronic Signature(s) Signed: 10/24/2021 4:37:01 PM By: Erenest Blank Entered By: Erenest Blank on 10/24/2021 15:51:05 -------------------------------------------------------------------------------- Wound Assessment Details Patient Name: Date of Service: [name] 10/24/2021 9:15 A M Medical Record Number: [id number] Patient Account Number: [id number] Date of Birth/Sex: Treating RN: 1966/03/25 (56 y.o. [name] Primary Care Arma Reining: [name] Other Clinician: Referring Danene Montijo: Treating Sanda Dejoy/Extender: [name] Weeks in Treatment: 30 Wound Status Wound Number: 1 Primary Etiology: Open Surgical Wound Wound  Location: Midline Back Wound Status: Open Wounding Event: Surgical Injury Comorbid History: Hypertension, Type II Diabetes, Osteomyelitis Date Acquired: 01/11/2021 Weeks Of Treatment: 30 Clustered Wound: No Wound Measurements Length: (cm) 6.5 Width: (cm) 1.4 Depth: (cm) 0.6 Area: (cm) 7.147 Volume: (cm) 4.288 % Reduction in Area: 90.5% % Reduction in Volume: 97.5% Wound Description Classification: Full Thickness With Exposed Support Structure Exudate Amount: Medium Exudate Type: Serosanguineous Exudate Color: red, brown s Treatment Notes Wound #1 (Back) Wound Laterality: Midline Cleanser Peri-Wound Care duoderm Discharge Instruction: border wound with duoderm Topical Primary Dressing Promogran Prisma Matrix, 4.34 (sq in) (silver collagen) Discharge Instruction: Moisten collagen with saline or hydrogel and pack into tunnel @ 12 Secondary Dressing Secured With Compression Wrap Compression Stockings Add-Ons Electronic Signature(s) Signed: 10/24/2021 4:37:01 PM By: Erenest Blank Signed: 10/25/2021 11:57:18 AM By: [name] RN Entered By: Erenest Blank on 10/24/2021  15:49:16 -------------------------------------------------------------------------------- Vitals Details Patient Name: Date of Service: Christian Brown E. 10/24/2021 9:15 A M Medical Record Number: [id number] Patient Account Number: [id number] Date of Birth/Sex: Treating RN: 1966-01-12 (56 y.o. [name], [name] Primary Care Farhad Burleson: [name] Other Clinician: Referring Naturi Alarid: Treating Miyani Cronic/Extender: [name] Weeks in Treatment: 30 Vital Signs Time Taken: 09:32 Temperature (F): 97.8 Pulse (bpm): 92 Respiratory Rate (breaths/min): 18 Blood Pressure (mmHg): 123/83 Capillary Blood Glucose (mg/dl): 123 Reference Range: 80 - 120 mg / dl Electronic Signature(s) Signed: 10/24/2021 4:37:01 PM By: Erenest Blank Entered By: Erenest Blank on 10/24/2021 09:37:07

## 2021-10-28 ENCOUNTER — Encounter (HOSPITAL_BASED_OUTPATIENT_CLINIC_OR_DEPARTMENT_OTHER): Payer: No Typology Code available for payment source | Admitting: Internal Medicine

## 2021-10-28 DIAGNOSIS — E11622 Type 2 diabetes mellitus with other skin ulcer: Secondary | ICD-10-CM | POA: Diagnosis not present

## 2021-10-28 NOTE — Progress Notes (Signed)
[name], [name] ([id number]) Visit Report for 10/28/2021 Arrival Information Details Patient Name: Date of Service: [name], [name] 10/28/2021 9:15 A M Medical Record Number: [id number] Patient Account Number: [id number] Date of Birth/Sex: Treating RN: 14-Apr-1966 (56 y.o. [name], [name] Primary Care Khristin Keleher: [name] Other Clinician: Referring Amaliya Whitelaw: Treating Chasta Deshpande/Extender: [name] Weeks in Treatment: 30 Visit Information History Since Last Visit Added or deleted any medications: No Patient Arrived: Ambulatory Any new allergies or adverse reactions: No Arrival Time: 09:17 Had a fall or experienced change in No Accompanied By: self activities of daily living that may affect Transfer Assistance: None risk of falls: Patient Identification Verified: Yes Signs or symptoms of abuse/neglect since last visito No Secondary Verification Process Completed: Yes Hospitalized since last visit: No Patient Requires Transmission-Based Precautions: No Implantable device outside of the clinic excluding No Patient Has Alerts: Yes cellular tissue based products placed in the center Patient Alerts: Patient on Blood Thinner since last visit: No BP Right Arm-PICC Has Dressing in Place as Prescribed: Yes Pain Present Now: No Electronic Signature(s) Signed: 10/28/2021 3:44:53 PM By: Erenest Blank Entered By: Erenest Blank on 10/28/2021 09:17:42 -------------------------------------------------------------------------------- Encounter Discharge Information Details Patient Name: Date of Service: Christian Brown E. 10/28/2021 9:15 A M Medical Record Number: [id number] Patient Account Number: [id number] Date of Birth/Sex: Treating RN: 1966-01-08 (56 y.o. [name] Primary Care Coleton Woon: [name] Other Clinician: Referring Dhyan Noah: Treating Cory Kitt/Extender: [name] Weeks in Treatment: 30 Encounter Discharge Information Items Discharge  Condition: Stable Ambulatory Status: Ambulatory Discharge Destination: Home Transportation: Private Auto Accompanied By: self Schedule Follow-up Appointment: Yes Clinical Summary of Care: Electronic Signature(s) Signed: 10/28/2021 3:44:53 PM By: Erenest Blank Entered By: Erenest Blank on 10/28/2021 10:20:22 -------------------------------------------------------------------------------- Negative Pressure Wound Therapy Maintenance (NPWT) Details Patient Name: Date of Service: [name] 10/28/2021 9:15 A M Medical Record Number: [id number] Patient Account Number: [id number] Date of Birth/Sex: Treating RN: Oct 28, 1965 (55 y.o. [name], [name] Primary Care Bless Lisenby: [name] Other Clinician: Referring Andrena Margerum: Treating Vickie Ponds/Extender: [name] Weeks in Treatment: 30 NPWT Maintenance Performed for: Wound #1 Midline Back Performed By: [name], RN Type: VAC System Coverage Size (sq cm): 9.1 Pressure Type: Constant Pressure Setting: 125 mmHG Drain Type: None Primary Contact: Non-Adherent Sponge/Dressing Type: Combination : white and black foam Date Initiated: 03/28/2021 Dressing Removed: No Quantity of Sponges/Gauze Removed: 1 white and 1 black Canister Changed: No Dressing Reapplied: No Quantity of Sponges/Gauze Inserted: 1 white and 1 black Respones T Treatment: o tolerated well Days On NPWT : 215 Electronic Signature(s) Signed: 10/28/2021 3:44:53 PM By: Erenest Blank Entered By: Erenest Blank on 10/28/2021 10:19:31 -------------------------------------------------------------------------------- Patient/Caregiver Education Details Patient Name: Date of Service: [name] 7/17/2023andnbsp9:15 [place] Record Number: [id number] Patient Account Number: [id number] Date of Birth/Gender: Treating RN: April 22, 1965 (56 y.o. [name] Primary Care Physician: [name] Other Clinician: Referring Physician: Treating  Physician/Extender: [name] in Treatment: 30 Education Assessment Education Provided To: Patient Education Topics Provided Electronic Signature(s) Signed: 10/28/2021 3:44:53 PM By: Erenest Blank Entered By: Erenest Blank on 10/28/2021 10:20:06 -------------------------------------------------------------------------------- Wound Assessment Details Patient Name: Date of Service: [name] 10/28/2021 9:15 A M Medical Record Number: [id number] Patient Account Number: [id number] Date of Birth/Sex: Treating RN: 1965/04/17 (56 y.o. [name] Primary Care Elon Eoff: [name] Other Clinician: Referring Victory Dresden: Treating Slaton Reaser/Extender: [name] Weeks in Treatment: 30 Wound Status Wound Number: 1 Primary Etiology: Open  Surgical Wound Wound Location: Midline Back Wound Status: Open Wounding Event: Surgical Injury Date Acquired: 01/11/2021 Weeks Of Treatment: 30 Clustered Wound: No Wound Measurements Length: (cm) 6.5 Width: (cm) 1.4 Depth: (cm) 0.6 Area: (cm) 7.147 Volume: (cm) 4.288 % Reduction in Area: 90.5% % Reduction in Volume: 97.5% Wound Description Classification: Full Thickness With Exposed Support Structure Exudate Amount: Medium Exudate Type: Serosanguineous Exudate Color: red, brown s Treatment Notes Wound #1 (Back) Wound Laterality: Midline Cleanser Peri-Wound Care duoderm Discharge Instruction: border wound with duoderm Topical Primary Dressing Promogran Prisma Matrix, 4.34 (sq in) (silver collagen) Discharge Instruction: Moisten collagen with saline or hydrogel and pack into tunnel @ 12 Secondary Dressing Secured With Compression Wrap Compression Stockings Add-Ons Electronic Signature(s) Signed: 10/28/2021 3:44:53 PM By: Erenest Blank Signed: 10/28/2021 3:45:51 PM By: [name] RN Entered By: Erenest Blank on 10/28/2021  09:19:46 -------------------------------------------------------------------------------- Vitals Details Patient Name: Date of Service: Christian Brown E. 10/28/2021 9:15 A M Medical Record Number: [id number] Patient Account Number: [id number] Date of Birth/Sex: Treating RN: 21-Feb-1966 (56 y.o. [name] Primary Care Nabila Albarracin: [name] Other Clinician: Referring Maly Lemarr: Treating Raziya Aveni/Extender: [name] Weeks in Treatment: 30 Vital Signs Time Taken: 09:17 Temperature (F): 98 Pulse (bpm): 94 Respiratory Rate (breaths/min): 18 Blood Pressure (mmHg): 126/78 Reference Range: 80 - 120 mg / dl Electronic Signature(s) Signed: 10/28/2021 3:44:53 PM By: Erenest Blank Entered By: Erenest Blank on 10/28/2021 10:18:51

## 2021-10-28 NOTE — Progress Notes (Signed)
CIRO, TASHIRO ([id number]) Visit Report for 10/10/2021 Arrival Information Details Patient Name: Date of Service: [name], IBARRA 10/10/2021 9:15 A M Medical Record Number: [id number] Patient Account Number: [id number] Date of Birth/Sex: Treating RN: 1965/09/18 (56 y.o. [name], [name] Primary Care Esteven Overfelt: [name] Other Clinician: Donavan Burnet Referring Kaylinn Dedic: Treating Ajamu Maxon/Extender: [name] in Treatment: 43 Visit Information History Since Last Visit All ordered tests and consults were completed: Yes Patient Arrived: Ambulatory Added or deleted any medications: No Arrival Time: 09:34 Any new allergies or adverse reactions: No Accompanied By: self Had a fall or experienced change in No Transfer Assistance: None activities of daily living that may affect Patient Identification Verified: Yes risk of falls: Secondary Verification Process Completed: Yes Signs or symptoms of abuse/neglect since last visito No Patient Requires Transmission-Based Precautions: No Hospitalized since last visit: No Patient Has Alerts: Yes Implantable device outside of the clinic excluding No Patient Alerts: Patient on Blood Thinner cellular tissue based products placed in the center No BP Right Arm-PICC since last visit: Pain Present Now: Yes Electronic Signature(s) Signed: 10/28/2021 12:47:02 PM By: Donavan Burnet CHT EMT BS , , Entered By: Donavan Burnet on 10/10/2021 09:38:01 -------------------------------------------------------------------------------- Encounter Discharge Information Details Patient Name: Date of Service: Christian Brown E. 10/10/2021 9:15 A M Medical Record Number: [id number] Patient Account Number: [id number] Date of Birth/Sex: Treating RN: October 16, 1965 (56 y.o. [name], [name] Primary Care Damaria Vachon: [name] Other Clinician: Referring Robin Pafford: Treating Hero Mccathern/Extender: [name] Weeks in Treatment:  28 Encounter Discharge Information Items Discharge Condition: Stable Ambulatory Status: Ambulatory Discharge Destination: Home Transportation: Private Auto Accompanied By: self Schedule Follow-up Appointment: Yes Clinical Summary of Care: Patient Declined Electronic Signature(s) Signed: 10/11/2021 11:43:29 AM By: [name] RN Entered By: [name] on 10/10/2021 10:27:10 -------------------------------------------------------------------------------- Negative Pressure Wound Therapy Maintenance (NPWT) Details Patient Name: Date of Service: [name] 10/10/2021 9:15 A M Medical Record Number: [id number] Patient Account Number: [id number] Date of Birth/Sex: Treating RN: November 18, 1965 (56 y.o. [name], [name] Primary Care Tehila Sokolow: [name] Other Clinician: Referring Maclain Cohron: Treating Cylas Falzone/Extender: [name] Weeks in Treatment: 28 NPWT Maintenance Performed for: Wound #1 Midline Back Performed By: [name], RN Type: VAC System Coverage Size (sq cm): 8.04 Pressure Type: Constant Pressure Setting: 125 mmHG Drain Type: None Primary Contact: Non-Adherent Sponge/Dressing Type: Combination : 1 white 1 black Date Initiated: 03/28/2021 Dressing Removed: Yes Quantity of Sponges/Gauze Removed: 1 white 1 black Canister Changed: No Canister Exudate Volume: 25 Dressing Reapplied: Yes Quantity of Sponges/Gauze Inserted: 1 white 1 black Respones T Treatment: o tolerates well Days On NPWT : 197 Electronic Signature(s) Signed: 10/11/2021 11:43:29 AM By: [name] RN Entered By: [name] on 10/10/2021 10:26:38 -------------------------------------------------------------------------------- Patient/Caregiver Education Details Patient Name: Date of Service: [name] 6/29/2023andnbsp9:15 [place] Record Number: [id number] Patient Account Number: [id number] Date of Birth/Gender: Treating RN: 03-16-1966 (57  y.o. [name] Primary Care Physician: [name] Other Clinician: Referring Physician: Treating Physician/Extender: [name] in Treatment: 28 Education Assessment Education Provided To: Patient Education Topics Provided Wound/Skin Impairment: Methods: Explain/Verbal Responses: Reinforcements needed, State content correctly [employer]) Signed: 10/11/2021 11:43:29 AM By: [name] RN Entered By: [name] on 10/10/2021 10:26:57 -------------------------------------------------------------------------------- Wound Assessment Details Patient Name: Date of Service: [name]. 10/10/2021 9:15 A M Medical Record Number: [id number] Patient Account Number: [id number] Date of Birth/Sex: Treating RN: 07-22-1965 (56 y.o. [name] Primary Care  Kodi Guerrera: [name] Other Clinician: Referring Perri Aragones: Treating Ronika Kelson/Extender: [name] Weeks in Treatment: 28 Wound Status Wound Number: 1 Primary Etiology: Open Surgical Wound Wound Location: Midline Back Wound Status: Open Wounding Event: Surgical Injury Date Acquired: 01/11/2021 Weeks Of Treatment: 28 Clustered Wound: No Wound Measurements Length: (cm) 6.7 Width: (cm) 1.2 Depth: (cm) 1 Area: (cm) 6.315 Volume: (cm) 6.315 % Reduction in Area: 91.6% % Reduction in Volume: 96.4% Wound Description Classification: Full Thickness With Exposed Support Structure Exudate Amount: Medium Exudate Type: Serosanguineous Exudate Color: red, brown s Electronic Signature(s) Signed: 10/11/2021 11:43:29 AM By: [name] RN Entered By: [name] on 10/10/2021 10:25:57 -------------------------------------------------------------------------------- Vitals Details Patient Name: Date of Service: Christian Brown E. 10/10/2021 9:15 A M Medical Record Number: [id number] Patient Account Number: [id number] Date of Birth/Sex: Treating  RN: 30-Jun-1965 (56 y.o. [name] Primary Care Danyella Mcginty: [name] Other Clinician: Referring Ivey Cina: Treating Beaux Wedemeyer/Extender: [name] Weeks in Treatment: 28 Vital Signs Time Taken: 09:38 Temperature (F): 98.7 Pulse (bpm): 96 Respiratory Rate (breaths/min): 18 Blood Pressure (mmHg): 124/76 Reference Range: 80 - 120 mg / dl Electronic Signature(s) Signed: 10/28/2021 12:47:02 PM By: Donavan Burnet CHT EMT BS , , Entered By: Donavan Burnet on 10/10/2021 09:38:32

## 2021-10-28 NOTE — Progress Notes (Signed)
[name], [name] ([id number]) Visit Report for 10/28/2021 [place] Details Patient Name: Date of Service: [name], [name] 10/28/2021 Medical Record Number: [id number] Patient Account Number: [id number] Date of Birth/Sex: Treating RN: 1965/08/29 (56 y.o. 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

## 2021-10-31 ENCOUNTER — Encounter (HOSPITAL_BASED_OUTPATIENT_CLINIC_OR_DEPARTMENT_OTHER): Payer: No Typology Code available for payment source | Admitting: Internal Medicine

## 2021-10-31 DIAGNOSIS — L98424 Non-pressure chronic ulcer of back with necrosis of bone: Secondary | ICD-10-CM | POA: Diagnosis not present

## 2021-10-31 DIAGNOSIS — M869 Osteomyelitis, unspecified: Secondary | ICD-10-CM

## 2021-10-31 DIAGNOSIS — E11622 Type 2 diabetes mellitus with other skin ulcer: Secondary | ICD-10-CM | POA: Diagnosis not present

## 2021-10-31 NOTE — Progress Notes (Signed)
Christian, Murphy ([id number]) Visit Report for 10/31/2021 Chief Complaint Document Details Patient Name: Date of Service: [name], [name] 10/31/2021 9:15 A M Medical Record Number: [id number] Patient Account Number: [id number] Date of Birth/Sex: Treating RN: [birth year] (56 y.o. [name] Primary Care Provider: [name] Other Clinician: Referring Provider: Treating Provider/Extender: [name] Weeks in Treatment: 31 Information Obtained from: Patient Chief Complaint Back wound Electronic Signature(s) Signed: 10/31/2021 2:00:38 PM By: Kalman Shan DO Entered By: Kalman Shan on 10/31/2021 10:46:00 -------------------------------------------------------------------------------- HPI Details Patient Name: Date of Service: [name]. 10/31/2021 9:15 A M Medical Record Number: [id number] Patient Account Number: [id number] Date of Birth/Sex: Treating RN: July 06, 1965 (56 y.o. [name] Primary Care Provider: [name] Other Clinician: Referring Provider: Treating Provider/Extender: [name] Weeks in Treatment: 31 History of Present Illness HPI Description: Admission 03/28/2021 Mr. Quantavius Humm is a 56 year old male with a past medical history of controlled type 2 diabetes on oral agents, obesity and OSA that presents to the clinic for a back wound. On 01/11/2021 patient had a laminectomy with PLIF of L1-S1 by Dr. Venetia Constable because of lumbar stenosis and radiculopathy. He subsequently developed bacteremia. He had CT imaging on 10/13 of the lumbar spine that showed fluid collection in the soft tissue of the posterior L1 and S1 and was taken to the OR for washout on 10/14. 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 He received 4 weeks of IV antibiotics by infectious disease. After his completion of 4 weeks of IV antibiotics he was continued for an additional 4 weeks of IV cefazolin with a stop date of 12/29.  He has been evaluated by plastic surgery and no plans for surgical intervention at this time. Wife is present and reports he has been on the wound VAC for the past 8 weeks with improvement in wound healing. He currently denies systemic signs of infection. 12/22; patient presents for follow-up. He reports no issues since last clinic visit. He denies signs of infection. He has been tolerating the wound VAC well. 12/30; patient presents for follow-up. He reports no issues and has no complaints today. He has been tolerating the wound VAC well. 1/9; patient presents for follow-up. He has no issues or complaints today. He states he feels well. He has had no problems with the wound VAC. 1/16; patient presents for follow-up. He continues to use the wound VAC with no issues. He denies signs of infection. 1/23; patient presents for follow-up. He has been switched from IV cefazolin to oral cefadroxil by infectious disease. He has no issues or complaints today. He denies signs of infection. He continues to tolerate the wound VAC well. 1/30; patient presents for follow-up. He continues to tolerate the wound VAC well. 2/6; patient presents for follow-up. He has no issues or complaints today. He continues to tolerate the wound VAC well. He denies signs of infection. 2/13; patient presents for follow-up. He continues to do well with the wound VAC. He denies any issues. 2/27; patient presents for follow-up. He continues to use the wound VAC without any issues. He denies signs of infection. 3/20; patient presents for follow-up. He has no issues or complaints today. He continues to use the wound VAC. 4/3; patient presents for follow-up. He continues to use the wound VAC without issues. He denies signs of infection. 4/17; 2-week follow-up. He continues to do well. His measurements are improved. Initially a surgical wound complicated by infection. 5/1; patient  presents for follow-up. He has no issues or complaints today.  He continues to tolerate the wound VAC well. He denies signs of infection. 5/15; patient presents for follow-up. He has noted more maceration to the periwound. He has been using the wound VAC without issues. He currently denies signs of infection. 5/30; patient presents for follow-up. He has been tolerating the wound VAC well over the past 2 weeks. He no longer has maceration to the periwound. He has no issues or complaints today. 6/8; this patient with a postsurgical wound that was complicated by postop infection. He has been using silver collagen under wound VAC and gradually doing well improvement in dimensions especially the tunnel at 12:00 6/22; patient presents for follow-up. We have been using silver collagen under the wound VAC. Patient has no issues or questions today. 7/6; patient presents for follow-up. Patient continues to use collagen under the wound VAC with no issues. 7/20; patient presents for follow-up. He has been using collagen under the wound VAC with no issues. He has been approved for Epicord. This was discussed with the patient and he is in agreement to having this placed at next clinic visit. Electronic Signature(s) Signed: 10/31/2021 2:00:38 PM By: Kalman Shan DO Entered By: Kalman Shan on 10/31/2021 10:46:38 -------------------------------------------------------------------------------- Physical Exam Details Patient Name: Date of Service: Christian, Murphy 10/31/2021 9:15 A M Medical Record Number: [id number] Patient Account Number: [id number] Date of Birth/Sex: Treating RN: [birth year] (56 y.o. [name] Primary Care Provider: [name] Other Clinician: Referring Provider: Treating Provider/Extender: [name] Weeks in Treatment: 31 Constitutional respirations regular, non-labored and within target range for patient.Marland Kitchen Psychiatric pleasant and cooperative. Notes T the lumbar spine over an incision site there is a dehisced wound  with tunneling at the 12 o'clock position. Tissue looks healthy. No signs of surrounding o infection. Electronic Signature(s) Signed: 10/31/2021 2:00:38 PM By: Kalman Shan DO Entered By: Kalman Shan on 10/31/2021 10:47:03 -------------------------------------------------------------------------------- Physician Orders Details Patient Name: Date of Service: [name]. 10/31/2021 9:15 A M Medical Record Number: [id number] Patient Account Number: [id number] Date of Birth/Sex: Treating RN: 09/05/1965 (56 y.o. [name] Primary Care Provider: [name] Other Clinician: Referring Provider: Treating Provider/Extender: [name] Weeks in Treatment: 49 Verbal / Phone Orders: No Diagnosis Coding Follow-up Appointments ppointment in 1 week. - Thursday 11/07/21 @ 0915 w/ Dr. Heber Edgefield and Alcide Clever) Room # 9 Return A ***For Epicord placement*** Nurse Visit: Larry Sierras Room # 6: Monday 11/04/21 @ 0830 Cellular or Tissue Based Products Cellular or Tissue Based Product Type: - Will apply Epicord next week Cellular or Tissue Based Product applied to wound bed, secured with steri-strips, cover with Adaptic or Mepitel. (DO NOT REMOVE). Bathing/ Shower/ Hygiene May shower with protection but do not get wound dressing(s) wet. Negative Presssure Wound Therapy Wound Vac to wound continuously at 147m/hg pressure - Use duoderm on periwound to prevent skin breakdown. Black Foam - wound base then bridge to right side White Foam - T tunnel at 12:00 o Additional Orders / Instructions Follow Nutritious Diet - Continue to monitor blood sugars daily Wound Treatment Wound #1 - Back Wound Laterality: Midline Peri-Wound Care: duoderm Discharge Instructions: border wound with duoderm Prim Dressing: Promogran Prisma Matrix, 4.34 (sq in) (silver collagen) ary Discharge Instructions: Moisten collagen with saline or hydrogel and pack into tunnel @ 12 Electronic  Signature(s) Signed: 10/31/2021 2:00:38 PM By: HKalman ShanDO Entered By: HKalman Shanon 10/31/2021 10:47:23 -------------------------------------------------------------------------------- Problem List  Details Patient Name: Date of Service: RONDY, KRUPINSKI 10/31/2021 9:15 A M Medical Record Number: [id number] Patient Account Number: [id number] Date of Birth/Sex: Treating RN: [birth year] (56 y.o. 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 10/31/2021 9:15 A M Medical Record Number: [id number] Patient Account Number: [id number] Date of Birth/Sex: Treating RN: December 15, 1965 (56 y.o. [name] Primary Care Provider: [name] Other Clinician: Referring Provider: Treating Provider/Extender: [name] Weeks in Treatment: 31 Subjective Chief Complaint Information obtained from Patient Back wound History of Present Illness (HPI) Admission 03/28/2021 Mr. Andersson Larrabee is a 56 year old male with a past medical history of controlled type 2 diabetes on oral agents, obesity and OSA that presents to the clinic for a back wound. On 01/11/2021 patient had a laminectomy with PLIF of L1-S1 by Dr. Venetia Constable because of lumbar stenosis and radiculopathy.  He subsequently developed bacteremia. He had CT imaging on 10/13 of the lumbar spine that showed fluid collection in the soft tissue of the posterior L1 and S1 and was taken to the OR for washout on 10/14. 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 He received 4 weeks of IV antibiotics by infectious disease. After his completion of 4 weeks of IV antibiotics he was continued for an additional 4 weeks of IV cefazolin with a stop date of 12/29. He has been evaluated by plastic surgery and no plans for surgical intervention at this time. Wife is present and reports he has been on the wound VAC for the past 8 weeks with improvement in wound healing. He currently denies systemic signs of infection. 12/22; patient presents for follow-up. He reports no issues since last clinic visit. He denies signs of infection. He has been tolerating the wound VAC well. 12/30; patient presents for follow-up. He reports no issues and has no complaints today. He has been tolerating the wound VAC well. 1/9; patient presents for follow-up. He has no issues or complaints today. He states he feels well. He has had no problems with the wound VAC. 1/16; patient presents for follow-up. He continues to use the wound VAC with no issues. He denies signs of infection. 1/23; patient presents for follow-up. He has been switched from IV cefazolin to oral cefadroxil by infectious disease. He has no issues or complaints today. He denies signs of infection. He continues to tolerate the wound VAC well. 1/30; patient presents for follow-up. He continues to tolerate the wound VAC well. 2/6; patient presents for follow-up. He has no issues or complaints today. He continues to tolerate the wound VAC well. He denies signs of infection. 2/13; patient presents for follow-up. He continues to do well with the wound VAC. He denies any issues. 2/27; patient presents for follow-up. He continues to use the wound VAC without any  issues. He denies signs of infection. 3/20; patient presents for follow-up. He has no issues or complaints today. He continues to use the wound VAC. 4/3; patient presents for  follow-up. He continues to use the wound VAC without issues. He denies signs of infection. 4/17; 2-week follow-up. He continues to do well. His measurements are improved. Initially a surgical wound complicated by infection. 5/1; patient presents for follow-up. He has no issues or complaints today. He continues to tolerate the wound VAC well. He denies signs of infection. 5/15; patient presents for follow-up. He has noted more maceration to the periwound. He has been using the wound VAC without issues. He currently denies signs of infection. 5/30; patient presents for follow-up. He has been tolerating the wound VAC well over the past 2 weeks. He no longer has maceration to the periwound. He has no issues or complaints today. 6/8; this patient with a postsurgical wound that was complicated by postop infection. He has been using silver collagen under wound VAC and gradually doing well improvement in dimensions especially the tunnel at 12:00 6/22; patient presents for follow-up. We have been using silver collagen under the wound VAC. Patient has no issues or questions today. 7/6; patient presents for follow-up. Patient continues to use collagen under the wound VAC with no issues. 7/20; patient presents for follow-up. He has been using collagen under the wound VAC with no issues. He has been approved for Epicord. This was discussed with the patient and he is in agreement to having this placed at next clinic visit. Patient History Information obtained from Patient. Family History Diabetes - Mother, Stroke - Siblings, No family history of Cancer, Heart Disease, Hereditary Spherocytosis, Hypertension, Kidney Disease, Lung Disease, Seizures, Thyroid Problems, Tuberculosis. Social History Never smoker, Marital Status - Married,  Alcohol Use - Rarely, Drug Use - No History, Caffeine Use - Rarely. Medical History Cardiovascular Patient has history of Hypertension Endocrine Patient has history of Type II Diabetes Musculoskeletal Patient has history of Osteomyelitis Medical A Surgical History Notes nd Musculoskeletal DDD Objective Constitutional respirations regular, non-labored and within target range for patient.. Vitals Time Taken: 9:39 AM, Temperature: 98.6 F, Pulse: 95 bpm, Respiratory Rate: 16 breaths/min, Blood Pressure: 118/76 mmHg, Capillary Blood Glucose: 123 mg/dl. Psychiatric pleasant and cooperative. General Notes: T the lumbar spine over an incision site there is a dehisced wound with tunneling at the 12 o'clock position. Tissue looks healthy. No signs of o surrounding infection. Integumentary (Hair, Skin) Wound #1 status is Open. Original cause of wound was Surgical Injury. The date acquired was: 01/11/2021. The wound has been in treatment 31 weeks. The wound is located on the Midline Back. The wound measures 5.7cm length x 1.2cm width x 1.5cm depth; 5.372cm^2 area and 8.058cm^3 volume. There is Fat Layer (Subcutaneous Tissue) exposed. There is no undermining noted, however, there is tunneling at 12:00 with a maximum distance of 1.8cm. There is a medium amount of serosanguineous drainage noted. The wound margin is distinct with the outline attached to the wound base. There is large (67-100%) red, pink granulation within the wound bed. There is no necrotic tissue within the wound bed. 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 I recommended continuing collagen under the wound VAC. He has been up proved for Epicord and we will place this at next clinic visit under the wound VAC. Plan Follow-up Appointments: Return Appointment in 1 week. - Thursday  11/07/21 @ 0915 w/ Dr. Heber Hazel and Alcide Clever) Room # 9 ***For Epicord placement*** Nurse Visit: Larry Sierras Room # 6: Monday 11/04/21 @ 0830 Cellular  or Tissue Based Products: Cellular or Tissue Based Product Type: - Will apply Epicord next week Cellular or Tissue Based Product applied to wound bed, secured with steri-strips, cover with Adaptic or Mepitel. (DO NOT REMOVE). Bathing/ Shower/ Hygiene: May shower with protection but do not get wound dressing(s) wet. Negative Presssure Wound Therapy: Wound Vac to wound continuously at 142m/hg pressure - Use duoderm on periwound to prevent skin breakdown. Black Foam - wound base then bridge to right side White Foam - T tunnel at 12:00 o Additional Orders / Instructions: Follow Nutritious Diet - Continue to monitor blood sugars daily WOUND #1: - Back Wound Laterality: Midline Peri-Wound Care: duoderm Discharge Instructions: border wound with duoderm Prim Dressing: Promogran Prisma Matrix, 4.34 (sq in) (silver collagen) ary Discharge Instructions: Moisten collagen with saline or hydrogel and pack into tunnel @ 12 1. Collagen under wound VAC 2. Follow-up in 1 week for Epicord placement Electronic Signature(s) Signed: 10/31/2021 2:00:38 PM By: HKalman ShanDO Entered By: HKalman Shanon 10/31/2021 10:48:03 -------------------------------------------------------------------------------- HxROS Details Patient Name: Date of Service: [name] 10/31/2021 9:15 A M Medical Record Number: [id number] Account Number: [id number] of Birth/Sex: Treating RN: [birth year](56y.o. MBurnadette Pop LCarthagePrimary Care Provider: PKathlene NovemberOther Clinician: Referring Provider: Treating Provider/Extender: HFreddi StarrWeeks in Treatment: 31 Information Obtained From Patient Cardiovascular Medical History: Positive for: Hypertension Endocrine Medical History: Positive for: Type II Diabetes Time with diabetes:  Since mid 90's Treated with: Oral agents Blood sugar tested every day: Yes Tested : 2x day Musculoskeletal Medical History: Positive for: Osteomyelitis Past Medical History Notes: DDD Immunizations Pneumococcal Vaccine: Received Pneumococcal Vaccination: Yes Received Pneumococcal Vaccination On or After 60th Birthday: No Implantable Devices Yes Family and Social History Cancer: No; Diabetes: Yes - Mother; Heart Disease: No; Hereditary Spherocytosis: No; Hypertension: No; Kidney Disease: No; Lung Disease: No; Seizures: No; Stroke: Yes - Siblings; Thyroid Problems: No; Tuberculosis: No; Never smoker; Marital Status - Married; Alcohol Use: Rarely; Drug Use: No History; Caffeine Use: Rarely; Financial Concerns: No; Food, Clothing or Shelter Needs: No; Support System Lacking: No; Transportation Concerns: No Electronic Signature(s) Signed: 10/31/2021 2:00:38 PM By: HKalman ShanDO Signed: 10/31/2021 4:28:42 PM By: BRhae HammockRN Entered By: HKalman Shanon 10/31/2021 10:46:45 -------------------------------------------------------------------------------- SuperBill Details Patient Name: Date of Service: MOneal Grout7/20/2023 Medical Record Number: [id number] Account Number: [id number] of Birth/Sex: Treating RN: 305-18-1967(56y.o. MBurnadette Pop [name] Primary Care Provider: PKathlene NovemberOther Clinician: Referring Provider: Treating Provider/Extender: HFreddi StarrWeeks in Treatment: 31 Diagnosis Coding ICD-10 Codes Code Description [id number]-pressure chronic ulcer of back with necrosis of bone M86.9 Osteomyelitis, unspecified E11.622 Type 2 diabetes mellitus with other skin ulcer Facility Procedures CPT4 Code: [id number] Description: 7605 - WOUND VAC-50 SQ CM OR LESS Modifier: Quantity: 1 Physician Procedures : CPT4 Code Description Modifier [id number] PHYS LEVEL 3 - EST PT ICD-10 Diagnosis Description L98.424 Non-pressure  chronic ulcer of back with necrosis of bone M86.9 Osteomyelitis, unspecified E11.622 Type 2 diabetes mellitus with other skin  ulcer Quantity: 1 Electronic Signature(s) Signed: 10/31/2021 2:00:38 PM By: HKalman ShanDO Entered By: HKalman Shanon 10/31/2021 10:48:27

## 2021-10-31 NOTE — Progress Notes (Signed)
[name], [name] ([id number]) Visit Report for 10/31/2021 Arrival Information Details Patient Name: Date of Service: [name], [name] 10/31/2021 9:15 A M Medical Record Number: [id number] Patient Account Number: [id number] Date of Birth/Sex: Treating RN: 05-Nov-1965 (56 y.o. [name] Primary Care Lee-Ann Gal: [name] Other Clinician: Referring Nahlia Hellmann: Treating Jermiyah Ricotta/Extender: [name] in Treatment: 85 Visit Information History Since Last Visit Added or deleted any medications: No Patient Arrived: Ambulatory Any new allergies or adverse reactions: No Arrival Time: 09:39 Had a fall or experienced change in No Accompanied By: self activities of daily living that may affect Transfer Assistance: None risk of falls: Patient Identification Verified: Yes Signs or symptoms of abuse/neglect since last visito No Secondary Verification Process Completed: Yes Hospitalized since last visit: No Patient Requires Transmission-Based Precautions: No Implantable device outside of the clinic excluding No Patient Has Alerts: Yes cellular tissue based products placed in the center Patient Alerts: Patient on Blood Thinner since last visit: No BP Right Arm-PICC Has Dressing in Place as Prescribed: Yes Pain Present Now: Yes Electronic Signature(s) Signed: 10/31/2021 5:46:02 PM By: Deon Pilling RN, BSN Entered By: Deon Pilling on 10/31/2021 09:39:28 -------------------------------------------------------------------------------- Encounter Discharge Information Details Patient Name: Date of Service: Christian Grout. 10/31/2021 9:15 A M Medical Record Number: [id number] Patient Account Number: [id number] Date of Birth/Sex: Treating RN: September 13, 1965 (56 y.o. [name] Primary Care Donnovan Stamour: [name] Other Clinician: Referring Jeremiah Tarpley: Treating Trapper Meech/Extender: [name] Weeks in Treatment: 31 Encounter Discharge Information Items Discharge  Condition: Stable Ambulatory Status: Ambulatory Discharge Destination: Home Transportation: Private Auto Accompanied By: self Schedule Follow-up Appointment: Yes Clinical Summary of Care: Patient Declined Electronic Signature(s) Signed: 10/31/2021 4:28:42 PM By: [name] RN Entered By: [name] on 10/31/2021 09:54:01 -------------------------------------------------------------------------------- Lower Extremity Assessment Details Patient Name: Date of Service: Christian Grout. 10/31/2021 9:15 A M Medical Record Number: [id number] Patient Account Number: [id number] Date of Birth/Sex: Treating RN: [birth year] (56 y.o. [name] Primary Care Glenroy Crossen: [name] Other Clinician: Referring Evalyn Shultis: Treating Lenox Bink/Extender: [name] Weeks in Treatment: 31 Electronic Signature(s) Signed: 10/31/2021 5:46:02 PM By: Deon Pilling RN, BSN Entered By: Deon Pilling on 10/31/2021 09:39:44 -------------------------------------------------------------------------------- Multi Wound Chart Details Patient Name: Date of Service: Christian Grout. 10/31/2021 9:15 A M Medical Record Number: [id number] Patient Account Number: [id number] Date of Birth/Sex: Treating RN: [birth year] (56 y.o. [name], [name] Primary Care Deakon Frix: [name] Other Clinician: Referring Anastaisa Wooding: Treating Geneva Pallas/Extender: [name] Weeks in Treatment: 31 Vital Signs Height(in): Capillary Blood Glucose(mg/dl): 123 Weight(lbs): Pulse(bpm): 95 Body Mass Index(BMI): Blood Pressure(mmHg): 118/76 Temperature(F): 98.6 Respiratory Rate(breaths/min): 16 Photos: [1:No Photos Midline Back] [N/A:N/A N/A] Wound Location: [1:Surgical Injury] [N/A:N/A] Wounding Event: [1:Open Surgical Wound] [N/A:N/A] Primary Etiology: [1:Hypertension, Type II Diabetes,] [N/A:N/A] Comorbid History: [1:Osteomyelitis 01/11/2021] [N/A:N/A] Date Acquired: [1:31] [N/A:N/A] Weeks  of Treatment: [1:Open] [N/A:N/A] Wound Status: [1:No] [N/A:N/A] Wound Recurrence: [1:5.7x1.2x1.5] [N/A:N/A] Measurements L x W x D (cm) [1:5.372] [N/A:N/A] A (cm) : rea [1:8.058] [N/A:N/A] Volume (cm) : [1:92.90%] [N/A:N/A] % Reduction in A rea: [1:95.40%] [N/A:N/A] % Reduction in Volume: [1:12] Position 1 (o'clock): [1:1.8] Maximum Distance 1 (cm): [1:Yes] [N/A:N/A] Tunneling: [1:Full Thickness With Exposed Support N/A] Classification: [1:Structures Medium] [N/A:N/A] Exudate Amount: [1:Serosanguineous] [N/A:N/A] Exudate Type: [1:red, brown] [N/A:N/A] Exudate Color: [1:Distinct, outline attached] [N/A:N/A] Wound Margin: [1:Large (67-100%)] [N/A:N/A] Granulation Amount: [1:Red, Pink] [N/A:N/A] Granulation Quality: [1:None Present (0%)] [N/A:N/A] Necrotic Amount: [1:Fat Layer (Subcutaneous Tissue): Yes N/A] Exposed Structures: [1:Fascia: No Tendon:  No Muscle: No Joint: No Bone: No Medium (34-66%)] [N/A:N/A] Epithelialization: [1:Negative Pressure Wound Therapy] [N/A:N/A] Procedures Performed: [1:Maintenance (NPWT)] Treatment Notes Wound #1 (Back) Wound Laterality: Midline Cleanser Peri-Wound Care duoderm Discharge Instruction: border wound with duoderm Topical Primary Dressing Promogran Prisma Matrix, 4.34 (sq in) (silver collagen) Discharge Instruction: Moisten collagen with saline or hydrogel and pack into tunnel @ 12 Secondary Dressing Secured With Compression Wrap Compression Stockings Add-Ons Electronic Signature(s) Signed: 10/31/2021 2:00:38 PM By: [name] Shan DO Signed: 10/31/2021 4:28:42 PM By: [name] RN Entered By: [name] Shan on 10/31/2021 10:45:46 -------------------------------------------------------------------------------- Multi-Disciplinary Care Plan Details Patient Name: Date of Service: Christian Grout. 10/31/2021 9:15 A M Medical Record Number: [id number] Patient Account Number: [id number] Date of Birth/Sex: Treating RN: [birth year]  (56 y.o. [name], [name] Primary Care Aydian Dimmick: [name] Other Clinician: Referring Laine Giovanetti: Treating Fartun Paradiso/Extender: [name] Weeks in Treatment: [place] reviewed with physician Active Inactive Wound/Skin Impairment Nursing Diagnoses: Impaired tissue integrity Goals: Patient/caregiver will verbalize understanding of skin care regimen Date Initiated: 03/28/2021 Target Resolution Date: 11/09/2021 Goal Status: Active Ulcer/skin breakdown will have a volume reduction of 30% by week 4 Date Initiated: 03/28/2021 Date Inactivated: 05/27/2021 Target Resolution Date: 05/31/2021 Goal Status: Met Interventions: Assess patient/caregiver ability to obtain necessary supplies Assess patient/caregiver ability to perform ulcer/skin care regimen upon admission and as needed Assess ulceration(s) every visit Provide education on ulcer and skin care Treatment Activities: Topical wound management initiated : 03/28/2021 Notes: Electronic Signature(s) Signed: 10/31/2021 4:28:42 PM By: [name] RN Entered By: [name] on 10/31/2021 09:45:25 -------------------------------------------------------------------------------- Negative Pressure Wound Therapy Maintenance (NPWT) Details Patient Name: Date of Service: [name], [name] 10/31/2021 9:15 A M Medical Record Number: [id number] Patient Account Number: [id number] Date of Birth/Sex: Treating RN: Oct 21, 1965 (56 y.o. [name], [name] Primary Care Glenda Kunst: [name] Other Clinician: Referring Skylen Danielsen: Treating Anival Pasha/Extender: [name] Weeks in Treatment: 31 NPWT Maintenance Performed for: Wound #1 Midline Back Performed By: [name], RN Type: VAC System Coverage Size (sq cm): 6.84 Pressure Type: Constant Pressure Setting: 125 mmHG Drain Type: None Primary Contact: Non-Adherent Sponge/Dressing Type: Combination : 1 WHITE FOAM 1 BLACK FOAM Date  Initiated: 03/28/2021 Dressing Removed: Yes Quantity of Sponges/Gauze Removed: 1 WHITE 1 BLACK Canister Changed: Yes Canister Exudate Volume: 50 Dressing Reapplied: Yes Quantity of Sponges/Gauze Inserted: 1 WHITE 1 BLACK Respones T Treatment: o TOLERATES WELL Days On NPWT : 218 Post Procedure Diagnosis Same as Pre-procedure Electronic Signature(s) Signed: 10/31/2021 4:28:42 PM By: [name] RN Entered By: [name] on 10/31/2021 09:46:30 -------------------------------------------------------------------------------- Pain Assessment Details Patient Name: Date of Service: Christian Grout. 10/31/2021 9:15 A M Medical Record Number: [id number] Patient Account Number: [id number] Date of Birth/Sex: Treating RN: 1965-12-25 (56 y.o. [name] Primary Care Johnpaul Gillentine: [name] Other Clinician: Referring Henli Hey: Treating Aramis Weil/Extender: [name] Weeks in Treatment: 31 Active Problems Location of Pain Severity and Description of Pain Patient Has Paino Yes Site Locations Pain Location: Pain Location: Generalized Pain Rate the pain. Current Pain Level: 5 Pain Management and Medication Current Pain Management: Medication: No Cold Application: No Rest: No Massage: No Activity: No T.E.N.S.: No Heat Application: No Leg drop or elevation: No Is the Current Pain Management Adequate: Adequate How does your wound impact your activities of daily livingo Sleep: No Bathing: No Appetite: No Relationship With Others: No Bladder Continence: No Emotions: No Bowel Continence: No Work: No Toileting: No Drive: No Dressing: No Hobbies: No Electronic Signature(s)  Signed: 10/31/2021 5:46:02 PM By: Deon Pilling RN, BSN Entered By: Deon Pilling on 10/31/2021 09:39:41 -------------------------------------------------------------------------------- Patient/Caregiver Education Details Patient Name: Date of Service: Christian Grout  7/20/2023andnbsp9:15 [place] Record Number: [id number] Patient Account Number: [id number] Date of Birth/Gender: Treating RN: [birth year] (56 y.o. [name] Primary Care Physician: [name] Other Clinician: Referring Physician: Treating Physician/Extender: [name] in Treatment: 31 Education Assessment Education Provided To: Patient Education Topics Provided Wound/Skin Impairment: Methods: Explain/Verbal Responses: Reinforcements needed, State content correctly [employer]) Signed: 10/31/2021 4:28:42 PM By: [name] RN Entered By: [name] on 10/31/2021 09:45:36 -------------------------------------------------------------------------------- Wound Assessment Details Patient Name: Date of Service: Christian Grout. 10/31/2021 9:15 A M Medical Record Number: [id number] Patient Account Number: [id number] Date of Birth/Sex: Treating RN: 01-Dec-1965 (56 y.o. [name] Primary Care Makalynn Berwanger: [name] Other Clinician: Referring Philipe Laswell: Treating Mikhala Kenan/Extender: [name] Weeks in Treatment: 31 Wound Status Wound Number: 1 Primary Etiology: Open Surgical Wound Wound Location: Midline Back Wound Status: Open Wounding Event: Surgical Injury Comorbid History: Hypertension, Type II Diabetes, Osteomyelitis Date Acquired: 01/11/2021 Weeks Of Treatment: 31 Clustered Wound: No Wound Measurements Length: (cm) 5.7 Width: (cm) 1.2 Depth: (cm) 1.5 Area: (cm) 5.372 Volume: (cm) 8.058 % Reduction in Area: 92.9% % Reduction in Volume: 95.4% Epithelialization: Medium (34-66%) Tunneling: Yes Position (o'clock): 12 Maximum Distance: (cm) 1.8 Undermining: No Wound Description Classification: Full Thickness With Exposed Support Structures Wound Margin: Distinct, outline attached Exudate Amount: Medium Exudate Type: Serosanguineous Exudate Color: red, brown Foul Odor After Cleansing:  No Slough/Fibrino No Wound Bed Granulation Amount: Large (67-100%) Exposed Structure Granulation Quality: Red, Pink Fascia Exposed: No Necrotic Amount: None Present (0%) Fat Layer (Subcutaneous Tissue) Exposed: Yes Tendon Exposed: No Muscle Exposed: No Joint Exposed: No Bone Exposed: No Treatment Notes Wound #1 (Back) Wound Laterality: Midline Cleanser Peri-Wound Care duoderm Discharge Instruction: border wound with duoderm Topical Primary Dressing Promogran Prisma Matrix, 4.34 (sq in) (silver collagen) Discharge Instruction: Moisten collagen with saline or hydrogel and pack into tunnel @ 12 Secondary Dressing Secured With Compression Wrap Compression Stockings Add-Ons Electronic Signature(s) Signed: 10/31/2021 5:46:02 PM By: Deon Pilling RN, BSN Entered By: Deon Pilling on 10/31/2021 09:45:04 -------------------------------------------------------------------------------- Vitals Details Patient Name: Date of Service: Christian Grout. 10/31/2021 9:15 A M Medical Record Number: [id number] Patient Account Number: [id number] Date of Birth/Sex: Treating RN: 1965/05/29 (56 y.o. [name] Primary Care Keyle Doby: [name] Other Clinician: Referring Mirl Hillery: Treating Kasin Tonkinson/Extender: [name] Weeks in Treatment: 31 Vital Signs Time Taken: 09:39 Temperature (F): 98.6 Pulse (bpm): 95 Respiratory Rate (breaths/min): 16 Blood Pressure (mmHg): 118/76 Capillary Blood Glucose (mg/dl): 123 Reference Range: 80 - 120 mg / dl Electronic Signature(s) Signed: 10/31/2021 5:46:02 PM By: Deon Pilling RN, BSN Entered By: Deon Pilling on 10/31/2021 09:39:58

## 2021-11-04 ENCOUNTER — Encounter (HOSPITAL_BASED_OUTPATIENT_CLINIC_OR_DEPARTMENT_OTHER): Payer: No Typology Code available for payment source | Admitting: Internal Medicine

## 2021-11-04 ENCOUNTER — Other Ambulatory Visit (HOSPITAL_COMMUNITY): Payer: Self-pay

## 2021-11-04 DIAGNOSIS — E11622 Type 2 diabetes mellitus with other skin ulcer: Secondary | ICD-10-CM | POA: Diagnosis not present

## 2021-11-04 NOTE — Progress Notes (Signed)
JAEKWON, MCCLUNE ([id number]) Visit Report for 11/04/2021 SuperBill Details Patient Name: Date of Service: [name], [name] 11/04/2021 Medical Record Number: [id number] Patient Account Number: [id number] Date of Birth/Sex: Treating RN: 24-Nov-1965 (56 y.o. 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

## 2021-11-07 ENCOUNTER — Encounter (HOSPITAL_BASED_OUTPATIENT_CLINIC_OR_DEPARTMENT_OTHER): Payer: No Typology Code available for payment source | Admitting: Internal Medicine

## 2021-11-07 DIAGNOSIS — E11622 Type 2 diabetes mellitus with other skin ulcer: Secondary | ICD-10-CM | POA: Diagnosis not present

## 2021-11-07 DIAGNOSIS — L98424 Non-pressure chronic ulcer of back with necrosis of bone: Secondary | ICD-10-CM

## 2021-11-13 ENCOUNTER — Ambulatory Visit: Payer: No Typology Code available for payment source | Admitting: Physical Medicine and Rehabilitation

## 2021-11-13 ENCOUNTER — Other Ambulatory Visit (HOSPITAL_COMMUNITY): Payer: Self-pay

## 2021-11-13 ENCOUNTER — Other Ambulatory Visit: Payer: Self-pay | Admitting: Physical Medicine and Rehabilitation

## 2021-11-13 MED ORDER — MORPHINE SULFATE ER 30 MG PO TBCR
30.0000 mg | EXTENDED_RELEASE_TABLET | Freq: Two times a day (BID) | ORAL | 0 refills | Status: DC
  Filled 2021-11-13 – 2021-11-25 (×2): qty 60, 30d supply, fill #0

## 2021-11-13 NOTE — Telephone Encounter (Signed)
Pmp report  Filled  Written  ID  Drug  QTY  Days  Prescriber  RX #  Dispenser  Refill  Daily Dose*  Pymt Type  PMP  10/18/2021 10/10/2021 2  Morphine Sulf Er 30 Mg Tablet 60.00 30 Me Lov [id number] Mos (0906) 0/0 60.00 MME Other [place]

## 2021-11-15 ENCOUNTER — Encounter (HOSPITAL_BASED_OUTPATIENT_CLINIC_OR_DEPARTMENT_OTHER): Payer: No Typology Code available for payment source | Attending: Internal Medicine | Admitting: Internal Medicine

## 2021-11-15 DIAGNOSIS — E1169 Type 2 diabetes mellitus with other specified complication: Secondary | ICD-10-CM | POA: Insufficient documentation

## 2021-11-15 DIAGNOSIS — M869 Osteomyelitis, unspecified: Secondary | ICD-10-CM | POA: Diagnosis not present

## 2021-11-15 DIAGNOSIS — I10 Essential (primary) hypertension: Secondary | ICD-10-CM | POA: Insufficient documentation

## 2021-11-15 DIAGNOSIS — G4733 Obstructive sleep apnea (adult) (pediatric): Secondary | ICD-10-CM | POA: Insufficient documentation

## 2021-11-15 DIAGNOSIS — E11622 Type 2 diabetes mellitus with other skin ulcer: Secondary | ICD-10-CM | POA: Diagnosis not present

## 2021-11-15 DIAGNOSIS — L98424 Non-pressure chronic ulcer of back with necrosis of bone: Secondary | ICD-10-CM | POA: Diagnosis not present

## 2021-11-15 DIAGNOSIS — E669 Obesity, unspecified: Secondary | ICD-10-CM | POA: Diagnosis not present

## 2021-11-15 NOTE — Progress Notes (Signed)
[name] ([id number]) Visit Report for 11/15/2021 Chief Complaint Document Details Patient Name: Date of Service: [name], [name] 11/15/2021 10:45 A Christian Medical Record Number: [id number] Patient Account Number: [id number] Date of Birth/Sex: Treating RN: 03-Oct-1965 (56 y.o. Christian) Primary Care Provider: [name] Other Clinician: Referring Provider: Treating Provider/Extender: [name] Weeks in Treatment: 33 Information Obtained from: Patient Chief Complaint 03/28/2021; Back wound Electronic Signature(s) Signed: 11/15/2021 1:25:18 PM By: Kalman Shan DO Entered By: Kalman Shan on 11/15/2021 12:55:24 -------------------------------------------------------------------------------- Cellular or Tissue Based Product Details Patient Name: Date of Service: [name] 11/15/2021 10:45 A Christian Medical Record Number: [id number] Patient Account Number: [id number] Date of Birth/Sex: Treating RN: 1965/10/07 (56 y.o. [name], [name] Primary Care Provider: [name] Other Clinician: Referring Provider: Treating Provider/Extender: [name] in Treatment: 33 Cellular or Tissue Based Product Type Wound #1 Midline Back Applied to: Performed By: Physician Kalman Shan, DO Cellular or Tissue Based Product Type: Epicord Level of Consciousness (Pre-procedure): Awake and Alert Pre-procedure Verification/Time Out Yes - 11:10 Taken: Location: trunk / arms / legs Wound Size (sq cm): 9 Product Size (sq cm): 6 Waste Size (sq cm): 0 Amount of Product Applied (sq cm): 6 Instrument Used: Forceps, Scissors Lot #: [id number] Expiration Date: 08/13/2026 Fenestrated: No Reconstituted: Yes Solution Type: NS Solution Amount: 3 ML Lot #: [id number] Solution Expiration Date: 01/12/2022 Secured: Yes Secured With: Steri-Strips Dressing Applied: Yes Primary Dressing: ADAPTIC Procedural Pain: 0 Post Procedural Pain: 0 Response to Treatment: Procedure was  tolerated well Level of Consciousness (Post- Awake and Alert procedure): Post Procedure Diagnosis Same as Pre-procedure Electronic Signature(s) Signed: 11/15/2021 1:25:18 PM By: Kalman Shan DO Signed: 11/15/2021 2:04:00 PM By: [name] RN Entered By: [name] on 11/15/2021 12:25:06 -------------------------------------------------------------------------------- Debridement Details Patient Name: Date of Service: Christian Brown E. 11/15/2021 10:45 A Christian Medical Record Number: [id number] Patient Account Number: [id number] Date of Birth/Sex: Treating RN: 26-Apr-1965 (56 y.o. [name], [name] Primary Care Provider: [name] Other Clinician: Referring Provider: Treating Provider/Extender: [name] Weeks in Treatment: 33 Debridement Performed for Assessment: Wound #1 Midline Back Performed By: Physician Kalman Shan, DO Debridement Type: Debridement Level of Consciousness (Pre-procedure): Awake and Alert Pre-procedure Verification/Time Out Yes - 11:05 Taken: Start Time: 11:05 Pain Control: Lidocaine T Area Debrided (L x W): otal 6 (cm) x 1.5 (cm) = 9 (cm) Tissue and other material debrided: Viable, Non-Viable, Slough, Subcutaneous, Slough Level: Skin/Subcutaneous Tissue Debridement Description: Excisional Instrument: Curette Bleeding: Minimum Hemostasis Achieved: Pressure End Time: 11:05 Procedural Pain: 0 Post Procedural Pain: 0 Response to Treatment: Procedure was tolerated well Level of Consciousness (Post- Awake and Alert procedure): Post Debridement Measurements of Total Wound Length: (cm) 6 Width: (cm) 1.5 Depth: (cm) 0.6 Volume: (cm) 4.241 Character of Wound/Ulcer Post Debridement: Improved Post Procedure Diagnosis Same as Pre-procedure Electronic Signature(s) Signed: 11/15/2021 1:25:18 PM By: Kalman Shan DO Signed: 11/15/2021 2:04:00 PM By: [name] RN Entered By: [name] on 11/15/2021  11:23:25 -------------------------------------------------------------------------------- HPI Details Patient Name: Date of Service: Christian Brown E. 11/15/2021 10:45 A [facility] Medical Record Number: [id number] Patient Account Number: [id number] Date of Birth/Sex: Treating RN: [birth year] (56 y.o. Christian) Primary Care Provider: [name] Other Clinician: Referring Provider: Treating Provider/Extender: [name] Weeks in Treatment: 31 History of Present Illness HPI Description: Admission 03/28/2021 [name] is a 56 year old male with a past medical history of controlled type 2 diabetes on oral agents, obesity and OSA that presents  to the clinic for a back wound. On 01/11/2021 patient had a laminectomy with PLIF of L1-S1 by Dr. Venetia Constable because of lumbar stenosis and radiculopathy. He subsequently developed bacteremia. He had CT imaging on 10/13 of the lumbar spine that showed fluid collection in the soft tissue of the posterior L1 and S1 and was taken to the OR for washout on 10/14. 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 He received 4 weeks of IV antibiotics by infectious disease. After his completion of 4 weeks of IV antibiotics he was continued for an additional 4 weeks of IV cefazolin with a stop date of 12/29. He has been evaluated by plastic surgery and no plans for surgical intervention at this time. Wife is present and reports he has been on the wound VAC for the past 8 weeks with improvement in wound healing. He currently denies systemic signs of infection. 12/22; patient presents for follow-up. He reports no issues since last clinic visit. He denies signs of infection. He has been tolerating the wound VAC well. 12/30; patient presents for follow-up. He reports no issues and has no complaints today. He has been tolerating the wound VAC well. 1/9; patient presents for follow-up. He has no issues or complaints today. He states he feels well. He has  had no problems with the wound VAC. 1/16; patient presents for follow-up. He continues to use the wound VAC with no issues. He denies signs of infection. 1/23; patient presents for follow-up. He has been switched from IV cefazolin to oral cefadroxil by infectious disease. He has no issues or complaints today. He denies signs of infection. He continues to tolerate the wound VAC well. 1/30; patient presents for follow-up. He continues to tolerate the wound VAC well. 2/6; patient presents for follow-up. He has no issues or complaints today. He continues to tolerate the wound VAC well. He denies signs of infection. 2/13; patient presents for follow-up. He continues to do well with the wound VAC. He denies any issues. 2/27; patient presents for follow-up. He continues to use the wound VAC without any issues. He denies signs of infection. 3/20; patient presents for follow-up. He has no issues or complaints today. He continues to use the wound VAC. 4/3; patient presents for follow-up. He continues to use the wound VAC without issues. He denies signs of infection. 4/17; 2-week follow-up. He continues to do well. His measurements are improved. Initially a surgical wound complicated by infection. 5/1; patient presents for follow-up. He has no issues or complaints today. He continues to tolerate the wound VAC well. He denies signs of infection. 5/15; patient presents for follow-up. He has noted more maceration to the periwound. He has been using the wound VAC without issues. He currently denies signs of infection. 5/30; patient presents for follow-up. He has been tolerating the wound VAC well over the past 2 weeks. He no longer has maceration to the periwound. He has no issues or complaints today. 6/8; this patient with a postsurgical wound that was complicated by postop infection. He has been using silver collagen under wound VAC and gradually doing well improvement in dimensions especially the tunnel at  12:00 6/22; patient presents for follow-up. We have been using silver collagen under the wound VAC. Patient has no issues or questions today. 7/6; patient presents for follow-up. Patient continues to use collagen under the wound VAC with no issues. 7/20; patient presents for follow-up. He has been using collagen under the wound VAC with no issues.  He has been approved for Epicord. This was discussed with the patient and he is in agreement to having this placed at next clinic visit. 7/27; patient presents for follow-up. He has been tolerating the wound VAC well with collagen underneath. He has been approved for epi cord and we have this to place in office today. 8/4; patient presents for follow-up. He had Epicort placed at last clinic visit. We held off on the wound VAC. He tolerated this well. He has no issues or complaints today. Electronic Signature(s) Signed: 11/15/2021 1:25:18 PM By: Kalman Shan DO Entered By: Kalman Shan on 11/15/2021 12:55:57 -------------------------------------------------------------------------------- Physical Exam Details Patient Name: Date of Service: YADIEL, AUBRY 11/15/2021 10:45 A [facility] Medical Record Number: [id number] Patient Account Number: [id number] Date of Birth/Sex: Treating RN: 1965/12/01 (56 y.o. Christian) Primary Care Provider: [name] Other Clinician: Referring Provider: Treating Provider/Extender: [name] Weeks in Treatment: 21 Constitutional respirations regular, non-labored and within target range for patient.Marland Kitchen Psychiatric pleasant and cooperative. Notes T the lumbar spine over an incision site there is a dehisced wound with tunneling at the 12 o'clock position. Tissue looks healthy. No signs of surrounding o infection. Electronic Signature(s) Signed: 11/15/2021 1:25:18 PM By: Kalman Shan DO Entered By: Kalman Shan on 11/15/2021  12:56:19 -------------------------------------------------------------------------------- Physician Orders Details Patient Name: Date of Service: [name]. 11/15/2021 10:45 A [facility] Medical Record Number: [id number] Patient Account Number: [id number] Date of Birth/Sex: Treating RN: 10/19/1965 (56 y.o. [name] Primary Care Provider: [name] Other Clinician: Referring Provider: Treating Provider/Extender: [name] Weeks in Treatment: 76 Verbal / Phone Orders: No Diagnosis Coding Follow-up Appointments ppointment in 1 week. - Friday 11/22/21 @ 1045 w/ Dr. Heber [place] and Allayne Butcher, Room 9 Return A ***For Epicord placement*** Cellular or Tissue Based Products Cellular or Tissue Based Product Type: - Epicord #2 applied 11/15/21 daptic or Mepitel. (DO NOT REMOVE). - leave Cellular or Tissue Based Product applied to wound bed, secured with steri-strips, cover with A the cutimed sorbact swab and steri-strips in place. Bathing/ Shower/ Hygiene May shower with protection but do not get wound dressing(s) wet. Negative Presssure Wound Therapy Wound Vac to wound continuously at 14m/hg pressure - start back today and leave on for 1 week Additional Orders / Instructions Follow Nutritious Diet - Continue to monitor blood sugars daily Wound Treatment Wound #1 - Back Wound Laterality: Midline Prim Dressing: Epicord 1 x Per Day/30 Days ary Discharge Instructions: applied by provider directly to wound bed. Electronic Signature(s) Signed: 11/15/2021 1:25:18 PM By: HKalman ShanDO Entered By: HKalman Shanon 11/15/2021 12:56:26 -------------------------------------------------------------------------------- Problem List Details Patient Name: Date of Service: [name] 11/15/2021 10:45 A [facility] Medical Record Number: [id number] Account Number: [id number] of Birth/Sex: Treating RN: 3April 04, 1967(56y.o. 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 Christian) Primary Care Provider: PLarose Kells  Jose Other Clinician: Referring Provider: Treating Provider/Extender: [name] Weeks in Treatment: 30 Subjective Chief Complaint Information obtained from Patient 03/28/2021; Back wound History of Present Illness (HPI) Admission 03/28/2021 Mr. Akiva Brassfield is a 56 year old male with a past medical history of controlled type 2 diabetes on oral agents, obesity and OSA that presents to the clinic for a back wound. On 01/11/2021 patient had a laminectomy with PLIF of L1-S1 by Dr. Venetia Constable because of lumbar stenosis and radiculopathy. He subsequently developed bacteremia. He had CT imaging on 10/13 of the lumbar spine that showed fluid collection in the soft tissue of the posterior L1 and S1 and was taken to the OR for washout on 10/14. 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 He received 4 weeks of IV antibiotics by infectious disease. After his completion of 4 weeks of IV antibiotics he was continued for an additional 4 weeks of IV cefazolin with a stop date of 12/29. He has  been evaluated by plastic surgery and no plans for surgical intervention at this time. Wife is present and reports he has been on the wound VAC for the past 8 weeks with improvement in wound healing. He currently denies systemic signs of infection. 12/22; patient presents for follow-up. He reports no issues since last clinic visit. He denies signs of infection. He has been tolerating the wound VAC well. 12/30; patient presents for follow-up. He reports no issues and has no complaints today. He has been tolerating the wound VAC well. 1/9; patient presents for follow-up. He has no issues or complaints today. He states he feels well. He has had no problems with the wound VAC. 1/16; patient presents for follow-up. He continues to use the wound VAC with no issues. He denies signs of infection. 1/23; patient presents for follow-up. He has been switched from IV cefazolin to oral cefadroxil by infectious disease. He has no issues or complaints today. He denies signs of infection. He continues to tolerate the wound VAC well. 1/30; patient presents for follow-up. He continues to tolerate the wound VAC well. 2/6; patient presents for follow-up. He has no issues or complaints today. He continues to tolerate the wound VAC well. He denies signs of infection. 2/13; patient presents for follow-up. He continues to do well with the wound VAC. He denies any issues. 2/27; patient presents for follow-up. He continues to use the wound VAC without any issues. He denies signs of infection. 3/20; patient presents for follow-up. He has no issues or complaints today. He continues to use the wound VAC. 4/3; patient presents for follow-up. He continues to use the wound VAC without issues. He denies signs of infection. 4/17; 2-week follow-up. He continues to do well. His measurements are improved. Initially a surgical wound complicated by infection. 5/1; patient presents for follow-up. He has no issues or complaints today. He  continues to tolerate the wound VAC well. He denies signs of infection. 5/15; patient presents for follow-up. He has noted more maceration to the periwound. He has been using the wound VAC without issues. He currently denies signs of infection. 5/30; patient presents for follow-up. He has been tolerating the wound VAC well over the past 2 weeks. He no longer has maceration to the periwound. He has no issues or complaints today. 6/8; this patient with a postsurgical wound that was complicated by postop infection. He has been using silver collagen under wound VAC and gradually doing well improvement in dimensions especially the tunnel at  12:00 6/22; patient presents for follow-up. We have been using silver collagen under the wound VAC. Patient has no issues or questions today. 7/6; patient presents for follow-up. Patient continues to use collagen under the wound VAC with no issues. 7/20; patient presents for follow-up. He has been using collagen under the wound VAC with no issues. He has been approved for Epicord. This was discussed with the patient and he is in agreement to having this placed at next clinic visit. 7/27; patient presents for follow-up. He has been tolerating the wound VAC well with collagen underneath. He has been approved for epi cord and we have this to place in office today. 8/4; patient presents for follow-up. He had Epicort placed at last clinic visit. We held off on the wound VAC. He tolerated this well. He has no issues or complaints today. Patient History Information obtained from Patient. Family History Diabetes - Mother, Stroke - Siblings, No family history of Cancer, Heart Disease, Hereditary Spherocytosis, Hypertension, Kidney Disease, Lung Disease, Seizures, Thyroid Problems, Tuberculosis. Social History Never smoker, Marital Status - Married, Alcohol Use - Rarely, Drug Use - No History, Caffeine Use - Rarely. Medical History Cardiovascular Patient has history of  Hypertension Endocrine Patient has history of Type II Diabetes Musculoskeletal Patient has history of Osteomyelitis Medical A Surgical History Notes nd Musculoskeletal DDD Objective Constitutional respirations regular, non-labored and within target range for patient.. Vitals Time Taken: 10:45 AM, Temperature: 98.2 F, Pulse: 99 bpm, Respiratory Rate: 18 breaths/min, Blood Pressure: 126/68 mmHg. Psychiatric pleasant and cooperative. General Notes: T the lumbar spine over an incision site there is a dehisced wound with tunneling at the 12 o'clock position. Tissue looks healthy. No signs of o surrounding infection. Integumentary (Hair, Skin) Wound #1 status is Open. Original cause of wound was Surgical Injury. The date acquired was: 01/11/2021. The wound has been in treatment 33 weeks. The wound is located on the Midline Back. The wound measures 6cm length x 1.5cm width x 0.6cm depth; 7.069cm^2 area and 4.241cm^3 volume. There is no undermining noted, however, there is tunneling at 12:00 with a maximum distance of 0.3cm. There is a medium amount of serosanguineous drainage noted. There is large (67-100%) red granulation within the wound bed. There is no necrotic tissue within the wound bed. 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 I debrided nonviable tissue. Epi cord #2 was placed in standard fashion. We will restart the wound VAC. Follow-up in 1 week. Procedures Wound #1 Pre-procedure diagnosis of Wound #1 is an Open Surgical Wound located on the Midline Back . There was a Excisional Skin/Subcutaneous Tissue Debridement with a total area of 9 sq cm performed by Kalman Shan, DO. With the following instrument(s): Curette to remove Viable and Non-Viable  tissue/material. Material removed includes Subcutaneous Tissue and Slough and after achieving pain control using Lidocaine. No specimens were taken. A time out was conducted at 11:05, prior to the start of the procedure. A Minimum amount of bleeding was controlled with Pressure. The procedure was tolerated well with a pain level of 0 throughout and a pain level of 0 following the procedure. Post Debridement Measurements: 6cm length x 1.5cm width x 0.6cm depth; 4.241cm^3 volume. Character of Wound/Ulcer Post Debridement is improved. Post procedure Diagnosis Wound #1: Same as Pre-Procedure Pre-procedure diagnosis of Wound #1 is an Open Surgical Wound  located on the Midline Back. A skin graft procedure using a bioengineered skin substitute/cellular or tissue based product was performed by Kalman Shan, DO with the following instrument(s): Forceps and Scissors. Epicord was applied and secured with Steri-Strips. 6 sq cm of product was utilized and 0 sq cm was wasted. Post Application, ADAPTIC was applied. A Time Out was conducted at 11:10, prior to the start of the procedure. The procedure was tolerated well with a pain level of 0 throughout and a pain level of 0 following the procedure. Post procedure Diagnosis Wound #1: Same as Pre-Procedure . Plan Follow-up Appointments: Return Appointment in 1 week. - Friday 11/22/21 @ 1045 w/ Dr. Heber [place] and Allayne Butcher, Room 9 ***For Epicord placement*** Cellular or Tissue Based Products: Cellular or Tissue Based Product Type: - Epicord #2 applied 11/15/21 Cellular or Tissue Based Product applied to wound bed, secured with steri-strips, cover with Adaptic or Mepitel. (DO NOT REMOVE). - leave the cutimed sorbact swab and steri-strips in place. Bathing/ Shower/ Hygiene: May shower with protection but do not get wound dressing(s) wet. Negative Presssure Wound Therapy: Wound Vac to wound continuously at 167m/hg pressure - start back today and leave on for 1  week Additional Orders / Instructions: Follow Nutritious Diet - Continue to monitor blood sugars daily WOUND #1: - Back Wound Laterality: Midline Prim Dressing: Epicord 1 x Per Day/30 Days ary Discharge Instructions: applied by provider directly to wound bed. 1. In office sharp debridement 2. Epicort #2 placed in standard fashion 3. Continue wound VAC 4. Follow-up in 1 week Electronic Signature(s) Signed: 11/15/2021 1:25:18 PM By: HKalman ShanDO Entered By: HKalman Shanon 11/15/2021 12:57:44 -------------------------------------------------------------------------------- HxROS Details Patient Name: Date of Service: Christian BrownE. 11/15/2021 10:45 A Christian Medical Record Number: [id number] Account Number: [id number] of Birth/Sex: Treating RN: [birth year](56y.o. Christian) Primary Care Provider: PKathlene NovemberOther Clinician: Referring Provider: Treating Provider/Extender: HFreddi StarrWeeks in Treatment: 315Information Obtained From Patient Cardiovascular Medical History: Positive for: Hypertension Endocrine Medical History: Positive for: Type II Diabetes Time with diabetes: Since mid 90's Treated with: Oral agents Blood sugar tested every day: Yes Tested : 2x day Musculoskeletal Medical History: Positive for: Osteomyelitis Past Medical History Notes: DDD Immunizations Pneumococcal Vaccine: Received Pneumococcal Vaccination: Yes Received Pneumococcal Vaccination On or After 60th Birthday: No Implantable Devices Yes Family and Social History Cancer: No; Diabetes: Yes - Mother; Heart Disease: No; Hereditary Spherocytosis: No; Hypertension: No; Kidney Disease: No; Lung Disease: No; Seizures: No; Stroke: Yes - Siblings; Thyroid Problems: No; Tuberculosis: No; Never smoker; Marital Status - Married; Alcohol Use: Rarely; Drug Use: No History; Caffeine Use: Rarely; Financial Concerns: No; Food, Clothing or Shelter Needs: No; Support System Lacking: No;  Transportation Concerns: No Electronic Signature(s) Signed: 11/15/2021 1:25:18 PM By: HKalman ShanDO Entered By: HKalman Shanon 11/15/2021 12:56:02 -------------------------------------------------------------------------------- SuperBill Details Patient Name: Date of Service: [name] 11/15/2021 Medical Record Number: [id number] Account Number: [id number] of Birth/Sex: Treating RN: 3February 08, 1967(56y.o. 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 Modifier: Quantity: 1 CPT4 Code:  [id number] Description: G8811 Epicord 2cm x 3cm - per sqcm Modifier: Quantity: 6 CPT4 Code: [id number] 1 Description: 5929 - SKIN SUB GRAFT TRNK/ARM/LEG ICD-10 Diagnosis Description 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

## 2021-11-15 NOTE — Progress Notes (Signed)
[name], [name] ([id number]) Visit Report for 11/15/2021 Arrival Information Details Patient Name: Date of Service: [name], [name] 11/15/2021 10:45 A M Medical Record Number: [id number] Patient Account Number: [id number] Date of Birth/Sex: Treating RN: 29-Apr-1965 (56 y.o. M) Primary Care Chaitanya Amedee: [name] Other Clinician: Referring Lucie Friedlander: Treating Micai Apolinar/Extender: [name] in Treatment: 40 Visit Information History Since Last Visit Added or deleted any medications: No Patient Arrived: Ambulatory Any new allergies or adverse reactions: No Arrival Time: 10:44 Had a fall or experienced change in No Accompanied By: self activities of daily living that may affect Transfer Assistance: None risk of falls: Patient Identification Verified: Yes Signs or symptoms of abuse/neglect since last visito No Secondary Verification Process Completed: Yes Hospitalized since last visit: No Patient Requires Transmission-Based Precautions: No Implantable device outside of the clinic excluding No Patient Has Alerts: Yes cellular tissue based products placed in the center Patient Alerts: Patient on Blood Thinner since last visit: No BP Right Arm-PICC Has Dressing in Place as Prescribed: Yes Pain Present Now: No Electronic Signature(s) Signed: 11/15/2021 12:25:32 PM By: Erenest Blank Entered By: Erenest Blank on 11/15/2021 10:45:20 -------------------------------------------------------------------------------- Encounter Discharge Information Details Patient Name: Date of Service: Christian Grout. 11/15/2021 10:45 A M Medical Record Number: [id number] Patient Account Number: [id number] Date of Birth/Sex: Treating RN: April 07, 1966 (56 y.o. [name], [name] Primary Care Harly Pipkins: [name] Other Clinician: Referring Mio Schellinger: Treating Dona Walby/Extender: [name] Weeks in Treatment: 33 Encounter Discharge Information Items Post Procedure Vitals Discharge  Condition: Stable Temperature (F): 98.7 Ambulatory Status: Ambulatory Pulse (bpm): 74 Discharge Destination: Home Respiratory Rate (breaths/min): 17 Transportation: Private Auto Blood Pressure (mmHg): 134/74 Accompanied By: self Schedule Follow-up Appointment: Yes Clinical Summary of Care: Patient Declined Electronic Signature(s) Signed: 11/15/2021 2:04:00 PM By: [name] RN Entered By: [name] on 11/15/2021 12:27:50 -------------------------------------------------------------------------------- Lower Extremity Assessment Details Patient Name: Date of Service: Christian Grout. 11/15/2021 10:45 A M Medical Record Number: [id number] Patient Account Number: [id number] Date of Birth/Sex: Treating RN: 1965-09-12 (56 y.o. M) Primary Care Miguel Christiana: [name] Other Clinician: Referring Toryn Mcclinton: Treating Zhamir Pirro/Extender: [name] Weeks in Treatment: 15 Electronic Signature(s) Signed: 11/15/2021 12:25:32 PM By: Erenest Blank Entered By: Erenest Blank on 11/15/2021 10:47:25 -------------------------------------------------------------------------------- Multi Wound Chart Details Patient Name: Date of Service: Christian Brown E. 11/15/2021 10:45 A M Medical Record Number: [id number] Patient Account Number: [id number] Date of Birth/Sex: Treating RN: 10-Sep-1965 (56 y.o. M) Primary Care Emon Lance: [name] Other Clinician: Referring Carys Malina: Treating Gabrian Hoque/Extender: [name] Weeks in Treatment: 33 Vital Signs Height(in): Pulse(bpm): 99 Weight(lbs): Blood Pressure(mmHg): 126/68 Body Mass Index(BMI): Temperature(F): 98.2 Respiratory Rate(breaths/min): 18 Photos: [N/A:N/A] Midline Back N/A N/A Wound Location: Surgical Injury N/A N/A Wounding Event: Open Surgical Wound N/A N/A Primary Etiology: Hypertension, Type II Diabetes, N/A N/A Comorbid History: Osteomyelitis 01/11/2021 N/A N/A Date Acquired: 4 N/A N/A Weeks of  Treatment: Open N/A N/A Wound Status: No N/A N/A Wound Recurrence: 6x1.5x0.6 N/A N/A Measurements L x W x D (cm) 7.069 N/A N/A A (cm) : rea 4.241 N/A N/A Volume (cm) : 90.60% N/A N/A % Reduction in A rea: 97.60% N/A N/A % Reduction in Volume: 12 Position 1 (o'clock): 0.3 Maximum Distance 1 (cm): Yes N/A N/A Tunneling: Full Thickness With Exposed Support N/A N/A Classification: Structures Medium N/A N/A Exudate Amount: Serosanguineous N/A N/A Exudate Type: red, brown N/A N/A Exudate Color: Large (67-100%) N/A N/A Granulation A mount: Red N/A N/A Granulation Quality: None Present (  0%) N/A N/A Necrotic A mount: Medium (34-66%) N/A N/A Epithelialization: Debridement - Excisional N/A N/A Debridement: 11:05 N/A N/A Pre-procedure Verification/Time Out Taken: Lidocaine N/A N/A Pain Control: Subcutaneous, Slough N/A N/A Tissue Debrided: Skin/Subcutaneous Tissue N/A N/A Level: 9 N/A N/A Debridement A (sq cm): rea Curette N/A N/A Instrument: Minimum N/A N/A Bleeding: Pressure N/A N/A Hemostasis A chieved: 0 N/A N/A Procedural Pain: 0 N/A N/A Post Procedural Pain: Procedure was tolerated well N/A N/A Debridement Treatment Response: 6x1.5x0.6 N/A N/A Post Debridement Measurements L x W x D (cm) 4.241 N/A N/A Post Debridement Volume: (cm) Cellular or Tissue Based Product N/A N/A Procedures Performed: Debridement Negative Pressure Wound Therapy Maintenance (NPWT) Treatment Notes Wound #1 (Back) Wound Laterality: Midline Cleanser Peri-Wound Care Topical Primary Dressing Epicord Discharge Instruction: applied by Haisley Arens directly to wound bed. Secondary Dressing Secured With Compression Wrap Compression Stockings Add-Ons Electronic Signature(s) Signed: 11/15/2021 1:25:18 PM By: Kalman Shan DO Entered By: Kalman Shan on 11/15/2021  12:54:52 -------------------------------------------------------------------------------- Multi-Disciplinary Care Plan Details Patient Name: Date of Service: Christian Grout. 11/15/2021 10:45 A M Medical Record Number: [id number] Patient Account Number: [id number] Date of Birth/Sex: Treating RN: 29-Feb-1966 (56 y.o. [name], [name] Primary Care Teonia Yager: [name] Other Clinician: Referring Keandria Berrocal: Treating Dalaina Tates/Extender: [name] Weeks in Treatment: [place] reviewed with physician Active Inactive Wound/Skin Impairment Nursing Diagnoses: Impaired tissue integrity Goals: Patient/caregiver will verbalize understanding of skin care regimen Date Initiated: 03/28/2021 Target Resolution Date: 12/27/2021 Goal Status: Active Ulcer/skin breakdown will have a volume reduction of 30% by week 4 Date Initiated: 03/28/2021 Date Inactivated: 05/27/2021 Target Resolution Date: 05/31/2021 Goal Status: Met Interventions: Assess patient/caregiver ability to obtain necessary supplies Assess patient/caregiver ability to perform ulcer/skin care regimen upon admission and as needed Assess ulceration(s) every visit Provide education on ulcer and skin care Treatment Activities: Topical wound management initiated : 03/28/2021 Notes: Electronic Signature(s) Signed: 11/15/2021 2:04:00 PM By: [name] RN Entered By: [name] on 11/15/2021 11:57:34 -------------------------------------------------------------------------------- Negative Pressure Wound Therapy Maintenance (NPWT) Details Patient Name: Date of Service: [name], [name] 11/15/2021 10:45 A M Medical Record Number: [id number] Patient Account Number: [id number] Date of Birth/Sex: Treating RN: 09/29/1965 (56 y.o. [name], [name] Primary Care Donnesha Karg: [name] Other Clinician: Referring Dannilynn Gallina: Treating Deara Bober/Extender: [name] Weeks in Treatment:  33 NPWT Maintenance Performed for: Wound #1 Midline Back Performed By: [name], RN Type: VAC System Coverage Size (sq cm): 9 Pressure Type: Constant Pressure Setting: 125 mmHG Drain Type: None Primary Contact: Non-Adherent Sponge/Dressing Type: Foam, Black Date Initiated: 03/28/2021 Date Suspended: 11/08/2021 Date Resumed: 11/15/2021 Dressing Removed: No Canister Changed: Yes Dressing Reapplied: No Quantity of Sponges/Gauze Inserted: 1 black foam Respones T Treatment: o TOLERATES WELL Days On NPWT : 227 Post Procedure Diagnosis Same as Pre-procedure Electronic Signature(s) Signed: 11/15/2021 2:04:00 PM By: [name] RN Entered By: [name] on 11/15/2021 12:26:08 -------------------------------------------------------------------------------- Pain Assessment Details Patient Name: Date of Service: Christian Brown E. 11/15/2021 10:45 A M Medical Record Number: [id number] Patient Account Number: [id number] Date of Birth/Sex: Treating RN: June 18, 1965 (56 y.o. M) Primary Care Sherill Mangen: [name] Other Clinician: Referring Alphonse Asbridge: Treating Calina Patrie/Extender: [name] Weeks in Treatment: 33 Active Problems Location of Pain Severity and Description of Pain Patient Has Paino No Site Locations Pain Management and Medication Current Pain Management: Electronic Signature(s) Signed: 11/15/2021 12:25:32 PM By: Erenest Blank Entered By: Erenest Blank on 11/15/2021 10:47:18 -------------------------------------------------------------------------------- Patient/Caregiver Education Details Patient Name: Date of Service: [name],  Christian [name]. 8/4/2023andnbsp10:45 A M Medical Record Number: [id number] Patient Account Number: [id number] Date of Birth/Gender: Treating RN: 1965-07-26 (56 y.o. [name] Primary Care Physician: [name] Other Clinician: Referring Physician: Treating Physician/Extender: [name] in  Treatment: 63 Education Assessment Education Provided To: Patient Education Topics Provided Wound/Skin Impairment: Methods: Explain/Verbal Responses: Reinforcements needed, State content correctly Electronic Signature(s) Signed: 11/15/2021 2:04:00 PM By: [name] RN Entered By: [name] on 11/15/2021 12:22:34 -------------------------------------------------------------------------------- Wound Assessment Details Patient Name: Date of Service: Christian Grout. 11/15/2021 10:45 A M Medical Record Number: [id number] Patient Account Number: [id number] Date of Birth/Sex: Treating RN: [birth year] (56 y.o. M) Primary Care Eragon Hammond: [name] Other Clinician: Referring Jiovanni Heeter: Treating Raegen Tarpley/Extender: [name] Weeks in Treatment: 33 Wound Status Wound Number: 1 Primary Etiology: Open Surgical Wound Wound Location: Midline Back Wound Status: Open Wounding Event: Surgical Injury Comorbid History: Hypertension, Type II Diabetes, Osteomyelitis Date Acquired: 01/11/2021 Weeks Of Treatment: 33 Clustered Wound: No Photos Wound Measurements Length: (cm) 6 Width: (cm) 1.5 Depth: (cm) 0.6 Area: (cm) 7.069 Volume: (cm) 4.241 % Reduction in Area: 90.6% % Reduction in Volume: 97.6% Epithelialization: Medium (34-66%) Tunneling: Yes Position (o'clock): 12 Maximum Distance: (cm) 0.3 Undermining: No Wound Description Classification: Full Thickness With Exposed Support Struct Exudate Amount: Medium Exudate Type: Serosanguineous Exudate Color: red, brown ures Wound Bed Granulation Amount: Large (67-100%) Granulation Quality: Red Necrotic Amount: None Present (0%) Treatment Notes Wound #1 (Back) Wound Laterality: Midline Cleanser Peri-Wound Care Topical Primary Dressing Epicord Discharge Instruction: applied by Estha Few directly to wound bed. Secondary Dressing Secured With Compression Wrap Compression Stockings Add-Ons Electronic  Signature(s) Signed: 11/15/2021 12:25:32 PM By: Erenest Blank Entered By: Erenest Blank on 11/15/2021 10:54:23 -------------------------------------------------------------------------------- Vitals Details Patient Name: Date of Service: Christian Brown E. 11/15/2021 10:45 A M Medical Record Number: [id number] Patient Account Number: [id number] Date of Birth/Sex: Treating RN: [birth year] (56 y.o. M) Primary Care Greco Gastelum: [name] Other Clinician: Referring Allien Melberg: Treating Micheline Markes/Extender: [name] Weeks in Treatment: 33 Vital Signs Time Taken: 10:45 Temperature (F): 98.2 Pulse (bpm): 99 Respiratory Rate (breaths/min): 18 Blood Pressure (mmHg): 126/68 Reference Range: 80 - 120 mg / dl Electronic Signature(s) Signed: 11/15/2021 12:25:32 PM By: Erenest Blank Entered By: Erenest Blank on 11/15/2021 10:47:09

## 2021-11-18 NOTE — Progress Notes (Signed)
Christian, Murphy ([id number]) Visit Report for 11/07/2021 Chief Complaint Document Details Patient Name: Date of Service: [name], [name] 11/07/2021 11:30 A M Medical Record Number: [id number] Patient Account Number: [id number] Date of Birth/Sex: Treating RN: Sep 04, 1965 (56 y.o. [name] Primary Care Provider: [name] Other Clinician: Referring Provider: Treating Provider/Extender: [name] Weeks in Treatment: 32 Information Obtained from: Patient Chief Complaint Back wound Electronic Signature(s) Signed: 11/07/2021 2:23:32 PM By: Kalman Shan DO Entered By: Kalman Shan on 11/07/2021 12:46:34 -------------------------------------------------------------------------------- Cellular or Tissue Based Product Details Patient Name: Date of Service: Murphy, Christian E. 11/07/2021 11:30 A M Medical Record Number: [id number] Patient Account Number: [id number] Date of Birth/Sex: Treating RN: [birth year] (56 y.o. [name] Primary Care Provider: [name] Other Clinician: Referring Provider: Treating Provider/Extender: [name] in Treatment: 32 Cellular or Tissue Based Product Type Wound #1 Midline Back Applied to: Performed By: Physician Kalman Shan, DO Cellular or Tissue Based Product Type: Epicord Level of Consciousness (Pre-procedure): Awake and Alert Pre-procedure Verification/Time Out Yes - 12:15 Taken: Location: trunk / arms / legs Wound Size (sq cm): 7 Product Size (sq cm): 6 Waste Size (sq cm): 0 Amount of Product Applied (sq cm): 6 Instrument Used: Forceps, Scissors Lot #: [id number] Order #: 1 Expiration Date: 07/14/2026 Fenestrated: No Reconstituted: Yes Solution Type: normal Saline Solution Amount: 62m Lot #: [id number] Expiration Date: 01/12/2022 Secured: Yes Secured With: Steri-Strips, cutimed sorbact Dressing Applied: Yes Primary Dressing: other part of the wound fibracol Procedural  Pain: 0 Post Procedural Pain: 0 Response to Treatment: Procedure was tolerated well Level of Consciousness (Post- Awake and Alert procedure): Post Procedure Diagnosis Same as Pre-procedure Electronic Signature(s) Signed: 11/07/2021 2:23:32 PM By: HKalman ShanDO Signed: 11/07/2021 4:23:35 PM By: DDeon PillingRN, BSN Entered By: DDeon Pillingon 11/07/2021 12:19:39 -------------------------------------------------------------------------------- Debridement Details Patient Name: Date of Service: [name] 11/07/2021 11:30 A M Medical Record Number: [id number] Account Number: [id number] of Birth/Sex: Treating RN: [birth year](56y.o. MHessie DienerPrimary Care Provider: PKathlene NovemberOther Clinician: Referring Provider: Treating Provider/Extender: HFreddi StarrWeeks in Treatment: 32 Debridement Performed for Assessment: Wound #1 Midline Back Performed By: Physician HKalman Shan DO Debridement Type: Debridement Level of Consciousness (Pre-procedure): Awake and Alert Pre-procedure Verification/Time Out Yes - 12:10 Taken: Start Time: 12:11 Pain Control: Lidocaine 5% topical ointment T Area Debrided (L x W): otal 5 (cm) x 1.4 (cm) = 7 (cm) Tissue and other material debrided: Viable, Non-Viable, Slough, Subcutaneous, Slough Level: Skin/Subcutaneous Tissue Debridement Description: Excisional Instrument: Curette Bleeding: Minimum Hemostasis Achieved: Pressure End Time: 12:14 Procedural Pain: 0 Post Procedural Pain: 0 Response to Treatment: Procedure was tolerated well Level of Consciousness (Post- Awake and Alert procedure): Post Debridement Measurements of Total Wound Length: (cm) 5 Width: (cm) 1.4 Depth: (cm) 1 Volume: (cm) 5.498 Character of Wound/Ulcer Post Debridement: Improved Post Procedure Diagnosis Same as Pre-procedure Electronic Signature(s) Signed: 11/07/2021 2:23:32 PM By: HKalman ShanDO Signed: 11/07/2021 4:23:35  PM By: DDeon PillingRN, BSN Entered By: DDeon Pillingon 11/07/2021 12:21:38 -------------------------------------------------------------------------------- HPI Details Patient Name: Date of Service: [name] 11/07/2021 11:30 A M Medical Record Number: [id number] Account Number: [id number] of Birth/Sex: Treating RN: 302-16-1967(56y.o. MErie NoePrimary Care Provider: PKathlene NovemberOther Clinician: Referring Provider: Treating Provider/Extender: HFreddi StarrWeeks in Treatment: 32 History of Present Illness HPI Description: Admission 03/28/2021 Mr. CNathen Murphy a 56year old male  with a past medical history of controlled type 2 diabetes on oral agents, obesity and OSA that presents to the clinic for a back wound. On 01/11/2021 patient had a laminectomy with PLIF of L1-S1 by Dr. Venetia Constable because of lumbar stenosis and radiculopathy. He subsequently developed bacteremia. He had CT imaging on 10/13 of the lumbar spine that showed fluid collection in the soft tissue of the posterior L1 and S1 and was taken to the OR for washout on 10/14. 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 He received 4 weeks of IV antibiotics by infectious disease. After his completion of 4 weeks of IV antibiotics he was continued for an additional 4 weeks of IV cefazolin with a stop date of 12/29. He has been evaluated by plastic surgery and no plans for surgical intervention at this time. Wife is present and reports he has been on the wound VAC for the past 8 weeks with improvement in wound healing. He currently denies systemic signs of infection. 12/22; patient presents for follow-up. He reports no issues since last clinic visit. He denies signs of infection. He has been tolerating the wound VAC well. 12/30; patient presents for follow-up. He reports no issues and has no complaints today. He has been tolerating the wound VAC well. 1/9; patient  presents for follow-up. He has no issues or complaints today. He states he feels well. He has had no problems with the wound VAC. 1/16; patient presents for follow-up. He continues to use the wound VAC with no issues. He denies signs of infection. 1/23; patient presents for follow-up. He has been switched from IV cefazolin to oral cefadroxil by infectious disease. He has no issues or complaints today. He denies signs of infection. He continues to tolerate the wound VAC well. 1/30; patient presents for follow-up. He continues to tolerate the wound VAC well. 2/6; patient presents for follow-up. He has no issues or complaints today. He continues to tolerate the wound VAC well. He denies signs of infection. 2/13; patient presents for follow-up. He continues to do well with the wound VAC. He denies any issues. 2/27; patient presents for follow-up. He continues to use the wound VAC without any issues. He denies signs of infection. 3/20; patient presents for follow-up. He has no issues or complaints today. He continues to use the wound VAC. 4/3; patient presents for follow-up. He continues to use the wound VAC without issues. He denies signs of infection. 4/17; 2-week follow-up. He continues to do well. His measurements are improved. Initially a surgical wound complicated by infection. 5/1; patient presents for follow-up. He has no issues or complaints today. He continues to tolerate the wound VAC well. He denies signs of infection. 5/15; patient presents for follow-up. He has noted more maceration to the periwound. He has been using the wound VAC without issues. He currently denies signs of infection. 5/30; patient presents for follow-up. He has been tolerating the wound VAC well over the past 2 weeks. He no longer has maceration to the periwound. He has no issues or complaints today. 6/8; this patient with a postsurgical wound that was complicated by postop infection. He has been using silver collagen  under wound VAC and gradually doing well improvement in dimensions especially the tunnel at 12:00 6/22; patient presents for follow-up. We have been using silver collagen under the wound VAC. Patient has no issues or questions today. 7/6; patient presents for follow-up. Patient continues to use collagen under the wound VAC with no  issues. 7/20; patient presents for follow-up. He has been using collagen under the wound VAC with no issues. He has been approved for Epicord. This was discussed with the patient and he is in agreement to having this placed at next clinic visit. 7/27; patient presents for follow-up. He has been tolerating the wound VAC well with collagen underneath. He has been approved for epi cord and we have this to place in office today. Electronic Signature(s) Signed: 11/07/2021 2:23:32 PM By: Kalman Shan DO Entered By: Kalman Shan on 11/07/2021 12:47:11 -------------------------------------------------------------------------------- Physical Exam Details Patient Name: Date of Service: Christian Brown E. 11/07/2021 11:30 A M Medical Record Number: [id number] Patient Account Number: [id number] Date of Birth/Sex: Treating RN: [birth year] (56 y.o. [name] Primary Care Provider: [name] Other Clinician: Referring Provider: Treating Provider/Extender: [name] Weeks in Treatment: 32 Constitutional respirations regular, non-labored and within target range for patient.Marland Kitchen Psychiatric pleasant and cooperative. Notes T the lumbar spine over an incision site there is a dehisced wound with tunneling at the 12 o'clock position. Tissue looks healthy. No signs of surrounding o infection. Electronic Signature(s) Signed: 11/07/2021 2:23:32 PM By: Kalman Shan DO Entered By: Kalman Shan on 11/07/2021 12:47:38 -------------------------------------------------------------------------------- Physician Orders Details Patient Name: Date of  Service: Christian Brown E. 11/07/2021 11:30 A M Medical Record Number: [id number] Patient Account Number: [id number] Date of Birth/Sex: Treating RN: 1965/07/09 (56 y.o. 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 - Friday 11/15/2021 1045 Dr. Heber [place] and Allayne Butcher, Room 9 Return A ***For Epicord placement*** ppointment in 2 weeks. - Friday 11/22/2021 1045 Dr. Heber  and Allayne Butcher, Room 9 Return A Cellular or Tissue Based Products Cellular or Tissue Based Product Type: - Epicord #1 applied 11/07/2021 daptic or Mepitel. (DO NOT REMOVE). - leave Cellular or Tissue Based Product applied to wound bed, secured with steri-strips, cover with A the cutimed sorbact swab and steri-strips in place. Bathing/ Shower/ Hygiene May shower with protection but do not get wound dressing(s) wet. Negative Presssure Wound Therapy Wound Vac to wound continuously at 160m/hg pressure - HOLD WOUND VAC THIS WEEK. Additional Orders / Instructions Follow Nutritious Diet - Continue to monitor blood sugars daily Wound Treatment Wound #1 - Back Wound Laterality: Midline Prim Dressing: Fibracol Plus Dressing, 4x4.38 in (collagen) 1 x Per Day/30 Days ary Discharge Instructions: Moisten collagen with saline or hydrogel Prim Dressing: Epicord 1 x Per Day/30 Days ary Discharge Instructions: applied by provider directly to wound bed. Secondary Dressing: Woven Gauze Sponge, Non-Sterile 4x4 in 1 x Per Day/30 Days Discharge Instructions: Apply over primary dressing as directed. Secondary Dressing: Zetuvit Plus Silicone Border Dressing  7x7(in/in) 1 x Per Day/30 Days Discharge Instructions: Apply silicone border over primary dressing as directed. Electronic Signature(s) Signed: 11/07/2021 2:23:32 PM By: HKalman ShanDO Entered By: HKalman Shanon 11/07/2021 12:47:44 -------------------------------------------------------------------------------- Problem List Details Patient Name: Date of Service: [name] 11/07/2021 11:30 A M Medical Record Number: [id number] Account Number: [id number] of Birth/Sex: Treating RN: [birth year](56y.o. 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 11/07/2021 11:30 A M Medical Record Number: [id number] Patient Account Number: [id number] Date of Birth/Sex: Treating RN: 08/01/1965 (56 y.o. [name] Primary Care Provider: [name] Other Clinician: Referring Provider: Treating Provider/Extender: [name] Weeks in Treatment: 32 Subjective Chief Complaint Information obtained from Patient Back wound History of Present Illness (HPI) Admission 03/28/2021 Mr. Yojan Paskett is a 56 year old male with a past medical history of controlled type 2 diabetes on oral agents, obesity and OSA that presents to the clinic for a back  wound. On 01/11/2021 patient had a laminectomy with PLIF of L1-S1 by Dr. Venetia Constable because of lumbar stenosis and radiculopathy. He subsequently developed bacteremia. He had CT imaging on 10/13 of the lumbar spine that showed fluid collection in the soft tissue of the posterior L1 and S1 and was taken to the OR for washout on 10/14. 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 He received 4 weeks of IV antibiotics by infectious disease. After his completion of 4 weeks of IV antibiotics he was continued for an additional 4 weeks of IV cefazolin with a stop date of 12/29. He has been evaluated by plastic surgery and no plans for surgical intervention at this time. Wife is present and reports he has been on the wound VAC for the past 8 weeks with improvement in wound healing. He currently denies systemic signs of infection. 12/22; patient presents for follow-up. He reports no issues since last clinic visit. He denies signs of infection. He has been tolerating the wound VAC well. 12/30; patient presents for follow-up. He reports no issues and has no complaints today. He has been tolerating the wound VAC well. 1/9; patient presents for follow-up. He has no issues or complaints today. He states he feels well. He has had no problems with the wound VAC. 1/16; patient presents for follow-up. He continues to use the wound VAC with no issues. He denies signs of infection. 1/23; patient presents for follow-up. He has been switched from IV cefazolin to oral cefadroxil by infectious disease. He has no issues or complaints today. He denies signs of infection. He continues to tolerate the wound VAC well. 1/30; patient presents for follow-up. He continues to tolerate the wound VAC well. 2/6; patient presents for follow-up. He has no issues or complaints today. He continues to tolerate the wound VAC well. He denies signs of infection. 2/13; patient presents for follow-up. He continues to do  well with the wound VAC. He denies any issues. 2/27; patient presents for follow-up. He continues to use the wound VAC without any issues. He denies signs of infection. 3/20; patient presents for follow-up. He has no issues or complaints today. He continues to use the wound VAC. 4/3; patient presents for follow-up. He continues to use the wound VAC without issues. He denies signs of infection. 4/17; 2-week follow-up. He continues to do well. His measurements are improved. Initially a surgical wound complicated by infection. 5/1; patient presents for follow-up. He has no issues or complaints today. He continues to tolerate the wound VAC well. He denies signs of infection.  5/15; patient presents for follow-up. He has noted more maceration to the periwound. He has been using the wound VAC without issues. He currently denies signs of infection. 5/30; patient presents for follow-up. He has been tolerating the wound VAC well over the past 2 weeks. He no longer has maceration to the periwound. He has no issues or complaints today. 6/8; this patient with a postsurgical wound that was complicated by postop infection. He has been using silver collagen under wound VAC and gradually doing well improvement in dimensions especially the tunnel at 12:00 6/22; patient presents for follow-up. We have been using silver collagen under the wound VAC. Patient has no issues or questions today. 7/6; patient presents for follow-up. Patient continues to use collagen under the wound VAC with no issues. 7/20; patient presents for follow-up. He has been using collagen under the wound VAC with no issues. He has been approved for Epicord. This was discussed with the patient and he is in agreement to having this placed at next clinic visit. 7/27; patient presents for follow-up. He has been tolerating the wound VAC well with collagen underneath. He has been approved for epi cord and we have this to place in office today. Patient  History Information obtained from Patient. Family History Diabetes - Mother, Stroke - Siblings, No family history of Cancer, Heart Disease, Hereditary Spherocytosis, Hypertension, Kidney Disease, Lung Disease, Seizures, Thyroid Problems, Tuberculosis. Social History Never smoker, Marital Status - Married, Alcohol Use - Rarely, Drug Use - No History, Caffeine Use - Rarely. Medical History Cardiovascular Patient has history of Hypertension Endocrine Patient has history of Type II Diabetes Musculoskeletal Patient has history of Osteomyelitis Medical A Surgical History Notes nd Musculoskeletal DDD Objective Constitutional respirations regular, non-labored and within target range for patient.. Vitals Time Taken: 11:44 AM, Temperature: 98.5 F, Pulse: 93 bpm, Respiratory Rate: 18 breaths/min, Blood Pressure: 138/86 mmHg. Psychiatric pleasant and cooperative. General Notes: T the lumbar spine over an incision site there is a dehisced wound with tunneling at the 12 o'clock position. Tissue looks healthy. No signs of o surrounding infection. Integumentary (Hair, Skin) Wound #1 status is Open. Original cause of wound was Surgical Injury. The date acquired was: 01/11/2021. The wound has been in treatment 32 weeks. The wound is located on the Midline Back. The wound measures 5cm length x 1.4cm width x 1cm depth; 5.498cm^2 area and 5.498cm^3 volume. There is no undermining noted, however, there is tunneling at 12:00 with a maximum distance of 1.5cm. There is a medium amount of serosanguineous drainage noted. There is large (67-100%) red granulation within the wound bed. There is no necrotic tissue within the wound bed. 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 I debrided nonviable tissue. Epicord was placed in standard fashion  today. Collagen was used to the remaining tissue that the [place] did not cover. He will keep this in place for 1 week. I recommended taking a wound VAC holiday for this next week, as he has had this on for a long period of time. Follow-up in 1 week. Procedures Wound #1 Pre-procedure diagnosis of Wound #1 is an Open Surgical Wound located on the Midline Back . There was a Excisional Skin/Subcutaneous Tissue Debridement with a total area of 7 sq cm performed by Kalman Shan, DO. With the following instrument(s): Curette to remove Viable and Non-Viable tissue/material. Material removed includes Subcutaneous Tissue and Slough and  after achieving pain control using Lidocaine 5% topical ointment. A time out was conducted at 12:10, prior to the start of the procedure. A Minimum amount of bleeding was controlled with Pressure. The procedure was tolerated well with a pain level of 0 throughout and a pain level of 0 following the procedure. Post Debridement Measurements: 5cm length x 1.4cm width x 1cm depth; 5.498cm^3 volume. Character of Wound/Ulcer Post Debridement is improved. Post procedure Diagnosis Wound #1: Same as Pre-Procedure Pre-procedure diagnosis of Wound #1 is an Open Surgical Wound located on the Midline Back. A skin graft procedure using a bioengineered skin substitute/cellular or tissue based product was performed by Kalman Shan, DO with the following instrument(s): Forceps and Scissors. Epicord was applied and secured with Steri-Strips and cutimed sorbact. 6 sq cm of product was utilized and 0 sq cm was wasted. Post Application, other part of the wound fibracol was applied. A Time Out was conducted at 12:15, prior to the start of the procedure. The procedure was tolerated well with a pain level of 0 throughout and a pain level of 0 following the procedure. Post procedure Diagnosis Wound #1: Same as Pre-Procedure . Plan Follow-up Appointments: Return Appointment in 1 week. -  Friday 11/15/2021 1045 Dr. Heber [place] and Allayne Butcher, Room 9 ***For Epicord placement*** Return Appointment in 2 weeks. - Friday 11/22/2021 1045 Dr. Heber [place] and Allayne Butcher, Room 9 Cellular or Tissue Based Products: Cellular or Tissue Based Product Type: - Epicord #1 applied 11/07/2021 Cellular or Tissue Based Product applied to wound bed, secured with steri-strips, cover with Adaptic or Mepitel. (DO NOT REMOVE). - leave the cutimed sorbact swab and steri-strips in place. Bathing/ Shower/ Hygiene: May shower with protection but do not get wound dressing(s) wet. Negative Presssure Wound Therapy: Wound Vac to wound continuously at 15m/hg pressure - HOLD WOUND VAC THIS WEEK. Additional Orders / Instructions: Follow Nutritious Diet - Continue to monitor blood sugars daily WOUND #1: - Back Wound Laterality: Midline Prim Dressing: Fibracol Plus Dressing, 4x4.38 in (collagen) 1 x Per Day/30 Days ary Discharge Instructions: Moisten collagen with saline or hydrogel Prim Dressing: Epicord 1 x Per Day/30 Days ary Discharge Instructions: applied by provider directly to wound bed. Secondary Dressing: Woven Gauze Sponge, Non-Sterile 4x4 in 1 x Per Day/30 Days Discharge Instructions: Apply over primary dressing as directed. Secondary Dressing: Zetuvit Plus Silicone Border Dressing 7x7(in/in) 1 x Per Day/30 Days Discharge Instructions: Apply silicone border over primary dressing as directed. 1. In office sharp debridement 2. Epicord placed in standard fashion 3. Wound VAC holiday 4. Follow-up in 1 week Electronic Signature(s) Signed: 11/07/2021 2:23:32 PM By: HKalman ShanDO Entered By: HKalman Shanon 11/07/2021 12:53:46 -------------------------------------------------------------------------------- HxROS Details Patient Name: Date of Service: [name] 11/07/2021 11:30 A M Medical Record Number: [id number] Account Number: [id number] of Birth/Sex: Treating RN: 302-04-1965(56y.o. MBurnadette Pop [name] Primary Care Provider: PKathlene NovemberOther Clinician: Referring Provider: Treating Provider/Extender: HFreddi StarrWeeks in Treatment: 32 Information Obtained From Patient Cardiovascular Medical History: Positive for: Hypertension Endocrine Medical History: Positive for: Type II Diabetes Time with diabetes: Since mid 90's Treated with: Oral agents Blood sugar tested every day: Yes Tested : 2x day Musculoskeletal Medical History: Positive for: Osteomyelitis Past Medical History Notes: DDD Immunizations Pneumococcal Vaccine: Received Pneumococcal Vaccination: Yes Received Pneumococcal Vaccination On or After 60th Birthday: No Implantable Devices Yes Family and Social History Cancer: No; Diabetes: Yes - Mother; Heart Disease: No; Hereditary Spherocytosis: No; Hypertension: No; Kidney Disease: No;  Lung Disease: No; Seizures: No; Stroke: Yes - Siblings; Thyroid Problems: No; Tuberculosis: No; Never smoker; Marital Status - Married; Alcohol Use: Rarely; Drug Use: No History; Caffeine Use: Rarely; Financial Concerns: No; Food, Clothing or Shelter Needs: No; Support System Lacking: No; Transportation Concerns: No Electronic Signature(s) Signed: 11/07/2021 2:23:32 PM By: Kalman Shan DO Signed: 11/18/2021 4:45:01 PM By: [name] RN Entered By: Kalman Shan on 11/07/2021 12:47:16 -------------------------------------------------------------------------------- [place] Details Patient Name: Date of Service: [name] 11/07/2021 Medical Record Number: [id number] Patient Account Number: [id number] Date of Birth/Sex: Treating RN: 09-Nov-1965 (56 y.o. 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

## 2021-11-18 NOTE — Progress Notes (Signed)
TIGHE, GITTO ([id number]) Visit Report for 11/04/2021 Arrival Information Details Patient Name: Date of Service: [name], [name] 11/04/2021 8:30 A M Medical Record Number: [id number] Patient Account Number: [id number] Date of Birth/Sex: Treating RN: Feb 25, 1966 (56 y.o. [name], [name] Primary Care Dereke Neumann: [name] Other Clinician: Referring Biran Mayberry: Treating Tabias Swayze/Extender: [name] in Treatment: 31 Visit Information History Since Last Visit Added or deleted any medications: No Patient Arrived: Ambulatory Any new allergies or adverse reactions: No Arrival Time: 08:23 Had a fall or experienced change in No Accompanied By: self activities of daily living that may affect Transfer Assistance: None risk of falls: Patient Identification Verified: Yes Signs or symptoms of abuse/neglect since last visito No Secondary Verification Process Completed: Yes Hospitalized since last visit: No Patient Requires Transmission-Based Precautions: No Implantable device outside of the clinic excluding No Patient Has Alerts: Yes cellular tissue based products placed in the center Patient Alerts: Patient on Blood Thinner since last visit: No BP Right Arm-PICC Has Dressing in Place as Prescribed: Yes Pain Present Now: No Electronic Signature(s) Signed: 11/04/2021 3:30:20 PM By: Erenest Blank Entered By: Erenest Blank on 11/04/2021 08:24:30 -------------------------------------------------------------------------------- Encounter Discharge Information Details Patient Name: Date of Service: Christian Brown E. 11/04/2021 8:30 A M Medical Record Number: [id number] Patient Account Number: [id number] Date of Birth/Sex: Treating RN: July 15, 1965 (56 y.o. [name], [name] Primary Care Kaiyah Eber: [name] Other Clinician: Erenest Blank Referring Johonna Binette: Treating Sparkles Mcneely/Extender: [name] Weeks in Treatment: 31 Encounter Discharge Information  Items Discharge Condition: Stable Ambulatory Status: Ambulatory Discharge Destination: Home Transportation: Private Auto Accompanied By: self Schedule Follow-up Appointment: Yes Clinical Summary of Care: Electronic Signature(s) Signed: 11/04/2021 3:30:20 PM By: Erenest Blank Entered By: Erenest Blank on 11/04/2021 09:18:20 -------------------------------------------------------------------------------- Negative Pressure Wound Therapy Maintenance (NPWT) Details Patient Name: Date of Service: Christian, Murphy 11/04/2021 8:30 A M Medical Record Number: [id number] Patient Account Number: [id number] Date of Birth/Sex: Treating RN: 04-10-1966 (56 y.o. [name], [name] Primary Care Hrishikesh Hoeg: [name] Other Clinician: Referring Volanda Mangine: Treating Tandrea Kommer/Extender: [name] Weeks in Treatment: 31 NPWT Maintenance Performed for: Wound #1 Midline Back Performed By: Erenest Blank, Type: VAC System Coverage Size (sq cm): 6.84 Pressure Type: Constant Pressure Setting: 125 mmHG Drain Type: None Primary Contact: Non-Adherent Sponge/Dressing Type: Combination : White, Black Date Initiated: 03/28/2021 Dressing Removed: No Quantity of Sponges/Gauze Removed: 1 white, 1 black Canister Changed: No Dressing Reapplied: No Quantity of Sponges/Gauze Inserted: 1 white, 1 black Respones T Treatment: o tolerated well Days On NPWT : 222 Electronic Signature(s) Signed: 11/04/2021 3:30:20 PM By: Erenest Blank Entered By: Erenest Blank on 11/04/2021 09:19:45 -------------------------------------------------------------------------------- Patient/Caregiver Education Details Patient Name: Date of Service: [name] 7/24/2023andnbsp8:30 [place] Record Number: [id number] Patient Account Number: [id number] Date of Birth/Gender: Treating RN: 11-12-1965 (56 y.o. [name] Primary Care Physician: [name] Other Clinician: Erenest Blank Referring  Physician: Treating Physician/Extender: [name] in Treatment: 31 Education Assessment Education Provided To: Patient Education Topics Provided Electronic Signature(s) Signed: 11/04/2021 3:30:20 PM By: Erenest Blank Entered By: Erenest Blank on 11/04/2021 09:18:00 -------------------------------------------------------------------------------- Wound Assessment Details Patient Name: Date of Service: Christian, Murphy 11/04/2021 8:30 A M Medical Record Number: [id number] Patient Account Number: [id number] Date of Birth/Sex: Treating RN: 04-04-1966 (56 y.o. [name] Primary Care Satonya Lux: [name] Other Clinician: Referring Mishell Donalson: Treating Enis Riecke/Extender: [name] Weeks in Treatment: 31 Wound Status Wound Number: 1 Primary Etiology: Open Surgical  Wound Wound Location: Midline Back Wound Status: Open Wounding Event: Surgical Injury Date Acquired: 01/11/2021 Weeks Of Treatment: 31 Clustered Wound: No Wound Measurements Length: (cm) 5.7 Width: (cm) 1.2 Depth: (cm) 1.5 Area: (cm) 5.372 Volume: (cm) 8.058 % Reduction in Area: 92.9% % Reduction in Volume: 95.4% Wound Description Classification: Full Thickness With Exposed Support Structure Exudate Amount: Medium Exudate Type: Serosanguineous Exudate Color: red, brown s Electronic Signature(s) Signed: 11/04/2021 3:30:20 PM By: Erenest Blank Signed: 11/18/2021 4:45:01 PM By: [name] RN Entered By: Erenest Blank on 11/04/2021 08:26:26 -------------------------------------------------------------------------------- Vitals Details Patient Name: Date of Service: Christian Brown E. 11/04/2021 8:30 A M Medical Record Number: [id number] Patient Account Number: [id number] Date of Birth/Sex: Treating RN: 29-Dec-1965 (56 y.o. [name] Primary Care Davionna Blacksher: [name] Other Clinician: Referring Everrett Lacasse: Treating Lindia Garms/Extender: [name] Weeks in Treatment: 31 Vital Signs Time Taken: 08:24 Temperature (F): 98.2 Pulse (bpm): 89 Respiratory Rate (breaths/min): 18 Blood Pressure (mmHg): 121/81 Reference Range: 80 - 120 mg / dl Electronic Signature(s) Signed: 11/04/2021 3:30:20 PM By: Erenest Blank Entered By: Erenest Blank on 11/04/2021 08:26:11

## 2021-11-18 NOTE — Progress Notes (Signed)
Christian, Murphy ([id number]) Visit Report for 11/07/2021 Arrival Information Details Patient Name: Date of Service: [name], [name] 11/07/2021 11:30 A M Medical Record Number: [id number] Patient Account Number: [id number] Date of Birth/Sex: Treating RN: 1966/03/14 (56 y.o. [name], [name] Primary Care Serrina Minogue: [name] Other Clinician: Referring Genni Buske: Treating Koleson Reifsteck/Extender: [name] in Treatment: 71 Visit Information History Since Last Visit Added or deleted any medications: No Patient Arrived: Ambulatory Any new allergies or adverse reactions: No Arrival Time: 11:44 Had a fall or experienced change in No Accompanied By: self activities of daily living that may affect Transfer Assistance: None risk of falls: Patient Identification Verified: Yes Signs or symptoms of abuse/neglect since last visito No Secondary Verification Process Completed: Yes Hospitalized since last visit: No Patient Requires Transmission-Based Precautions: No Implantable device outside of the clinic excluding No Patient Has Alerts: Yes cellular tissue based products placed in the center Patient Alerts: Patient on Blood Thinner since last visit: No BP Right Arm-PICC Has Dressing in Place as Prescribed: Yes Pain Present Now: No Electronic Signature(s) Signed: 11/07/2021 3:54:56 PM By: Erenest Blank Entered By: Erenest Blank on 11/07/2021 12:02:43 -------------------------------------------------------------------------------- Encounter Discharge Information Details Patient Name: Date of Service: Christian Brown E. 11/07/2021 11:30 A M Medical Record Number: [id number] Patient Account Number: [id number] Date of Birth/Sex: Treating RN: [birth year] (56 y.o. [name] Primary Care Sheneika Walstad: [name] Other Clinician: Referring Jaeger Trueheart: Treating Gumecindo Hopkin/Extender: [name] Weeks in Treatment: 32 Encounter Discharge Information Items Post Procedure  Vitals Discharge Condition: Stable Temperature (F): 98.5 Ambulatory Status: Ambulatory Pulse (bpm): 93 Discharge Destination: Home Respiratory Rate (breaths/min): 20 Transportation: Private Auto Blood Pressure (mmHg): 138/86 Accompanied By: self Schedule Follow-up Appointment: Yes Clinical Summary of Care: Electronic Signature(s) Signed: 11/07/2021 4:23:35 PM By: Deon Pilling RN, BSN Entered By: Deon Pilling on 11/07/2021 12:37:53 -------------------------------------------------------------------------------- Lower Extremity Assessment Details Patient Name: Date of Service: Christian Brown E. 11/07/2021 11:30 A M Medical Record Number: [id number] Patient Account Number: [id number] Date of Birth/Sex: Treating RN: Jun 19, 1965 (55 y.o. [name] Primary Care Indianna Boran: [name] Other Clinician: Referring Lorita Forinash: Treating Aleene Swanner/Extender: [name] Weeks in Treatment: 32 Electronic Signature(s) Signed: 11/07/2021 4:23:35 PM By: Deon Pilling RN, BSN Entered By: Deon Pilling on 11/07/2021 11:52:57 -------------------------------------------------------------------------------- Multi Wound Chart Details Patient Name: Date of Service: Christian Brown E. 11/07/2021 11:30 A M Medical Record Number: [id number] Patient Account Number: [id number] Date of Birth/Sex: Treating RN: 1966/03/29 (56 y.o. [name], [name] Primary Care Equilla Que: [name] Other Clinician: Referring Jiyan Walkowski: Treating Johntae Broxterman/Extender: [name] Weeks in Treatment: 32 Vital Signs Height(in): Pulse(bpm): 93 Weight(lbs): Blood Pressure(mmHg): 138/86 Body Mass Index(BMI): Temperature(F): 98.5 Respiratory Rate(breaths/min): 18 Photos: [N/A:N/A] Midline Back N/A N/A Wound Location: Surgical Injury N/A N/A Wounding Event: Open Surgical Wound N/A N/A Primary Etiology: Hypertension, Type II Diabetes, N/A N/A Comorbid History: Osteomyelitis 01/11/2021 N/A  N/A Date Acquired: 32 N/A N/A Weeks of Treatment: Open N/A N/A Wound Status: No N/A N/A Wound Recurrence: 5x1.4x1 N/A N/A Measurements L x W x D (cm) 5.498 N/A N/A A (cm) : rea 5.498 N/A N/A Volume (cm) : 92.70% N/A N/A % Reduction in A rea: 96.80% N/A N/A % Reduction in Volume: 12 Position 1 (o'clock): 1.5 Maximum Distance 1 (cm): Yes N/A N/A Tunneling: Full Thickness With Exposed Support N/A N/A Classification: Structures Medium N/A N/A Exudate Amount: Serosanguineous N/A N/A Exudate Type: red, brown N/A N/A Exudate Color: Large (67-100%) N/A N/A Granulation A mount:  Red N/A N/A Granulation Quality: None Present (0%) N/A N/A Necrotic Amount: Debridement - Excisional N/A N/A Debridement: 12:10 N/A N/A Pre-procedure Verification/Time Out Taken: Lidocaine 5% topical ointment N/A N/A Pain Control: Subcutaneous, Slough N/A N/A Tissue Debrided: Skin/Subcutaneous Tissue N/A N/A Level: 7 N/A N/A Debridement A (sq cm): rea Curette N/A N/A Instrument: Minimum N/A N/A Bleeding: Pressure N/A N/A Hemostasis A chieved: 0 N/A N/A Procedural Pain: 0 N/A N/A Post Procedural Pain: Procedure was tolerated well N/A N/A Debridement Treatment Response: 5x1.4x1 N/A N/A Post Debridement Measurements L x W x D (cm) 5.498 N/A N/A Post Debridement Volume: (cm) Cellular or Tissue Based Product N/A N/A Procedures Performed: Debridement Treatment Notes Wound #1 (Back) Wound Laterality: Midline Cleanser Peri-Wound Care Topical Primary Dressing Fibracol Plus Dressing, 4x4.38 in (collagen) Discharge Instruction: Moisten collagen with saline or hydrogel Epicord Discharge Instruction: applied by Jawanza Zambito directly to wound bed. Secondary Dressing Woven Gauze Sponge, Non-Sterile 4x4 in Discharge Instruction: Apply over primary dressing as directed. Zetuvit Plus Silicone Border Dressing 7x7(in/in) Discharge Instruction: Apply silicone border over primary  dressing as directed. Secured With Compression Wrap Compression Stockings Add-Ons Electronic Signature(s) Signed: 11/07/2021 2:23:32 PM By: Kalman Shan DO Signed: 11/18/2021 4:45:01 PM By: [name] RN Entered By: Kalman Shan on 11/07/2021 12:46:25 -------------------------------------------------------------------------------- Multi-Disciplinary Care Plan Details Patient Name: Date of Service: Christian Brown E. 11/07/2021 11:30 A M Medical Record Number: [id number] Patient Account Number: [id number] Date of Birth/Sex: Treating RN: 10/05/1965 (56 y.o. [name] Primary Care Chaniya Genter: [name] Other Clinician: Referring Veronika Heard: Treating Onika Gudiel/Extender: [name] Weeks in Treatment: [place] reviewed with physician Active Inactive Wound/Skin Impairment Nursing Diagnoses: Impaired tissue integrity Goals: Patient/caregiver will verbalize understanding of skin care regimen Date Initiated: 03/28/2021 Target Resolution Date: 12/27/2021 Goal Status: Active Ulcer/skin breakdown will have a volume reduction of 30% by week 4 Date Initiated: 03/28/2021 Date Inactivated: 05/27/2021 Target Resolution Date: 05/31/2021 Goal Status: Met Interventions: Assess patient/caregiver ability to obtain necessary supplies Assess patient/caregiver ability to perform ulcer/skin care regimen upon admission and as needed Assess ulceration(s) every visit Provide education on ulcer and skin care Treatment Activities: Topical wound management initiated : 03/28/2021 Notes: Electronic Signature(s) Signed: 11/07/2021 4:23:35 PM By: Deon Pilling RN, BSN Entered By: Deon Pilling on 11/07/2021 11:52:36 -------------------------------------------------------------------------------- Pain Assessment Details Patient Name: Date of Service: Christian Brown E. 11/07/2021 11:30 A M Medical Record Number: [id number] Patient Account Number:  [id number] Date of Birth/Sex: Treating RN: [birth year] (56 y.o. [name], [name] Primary Care Yusuke Beza: [name] Other Clinician: Referring Taydon Nasworthy: Treating Lasaundra Riche/Extender: [name] Weeks in Treatment: 32 Active Problems Location of Pain Severity and Description of Pain Patient Has Paino No Site Locations Pain Management and Medication Current Pain Management: Electronic Signature(s) Signed: 11/07/2021 3:54:56 PM By: Erenest Blank Signed: 11/18/2021 4:45:01 PM By: [name] RN Entered By: Erenest Blank on 11/07/2021 11:46:19 -------------------------------------------------------------------------------- Patient/Caregiver Education Details Patient Name: Date of Service: [name] 7/27/2023andnbsp11:30 [place] Record Number: [id number] Patient Account Number: [id number] Date of Birth/Gender: Treating RN: Sep 17, 1965 (56 y.o. [name] Primary Care Physician: [name] Other Clinician: Referring Physician: Treating Physician/Extender: [name] in Treatment: 78 Education Assessment Education Provided To: Patient Education Topics Provided Wound/Skin Impairment: Handouts: Skin Care Do's and Dont's Methods: Explain/Verbal Responses: Reinforcements needed Electronic Signature(s) Signed: 11/07/2021 4:23:35 PM By: Deon Pilling RN, BSN Entered By: Deon Pilling on 11/07/2021 11:52:46 -------------------------------------------------------------------------------- Wound Assessment Details Patient Name: Date of Service: [place]  [name]. 11/07/2021 11:30 A M Medical Record Number: [id number] Patient Account Number: [id number] Date of Birth/Sex: Treating RN: 1965/09/16 (56 y.o. [name], [name] Primary Care Conor Lata: [name] Other Clinician: Referring Emanuel Campos: Treating Giannina Bartolome/Extender: [name] Weeks in Treatment: 32 Wound Status Wound Number: 1 Primary Etiology: Open Surgical  Wound Wound Location: Midline Back Wound Status: Open Wounding Event: Surgical Injury Comorbid History: Hypertension, Type II Diabetes, Osteomyelitis Date Acquired: 01/11/2021 Weeks Of Treatment: 32 Clustered Wound: No Photos Wound Measurements Length: (cm) 5 Width: (cm) 1.4 Depth: (cm) 1 Area: (cm) 5.498 Volume: (cm) 5.498 % Reduction in Area: 92.7% % Reduction in Volume: 96.8% Tunneling: Yes Position (o'clock): 12 Maximum Distance: (cm) 1.5 Undermining: No Wound Description Classification: Full Thickness With Exposed Support Struct Exudate Amount: Medium Exudate Type: Serosanguineous Exudate Color: red, brown ures Wound Bed Granulation Amount: Large (67-100%) Granulation Quality: Red Necrotic Amount: None Present (0%) Electronic Signature(s) Signed: 11/07/2021 3:54:56 PM By: Erenest Blank Signed: 11/18/2021 4:45:01 PM By: [name] RN Entered By: Erenest Blank on 11/07/2021 12:05:10 -------------------------------------------------------------------------------- Vitals Details Patient Name: Date of Service: Christian Brown E. 11/07/2021 11:30 A M Medical Record Number: [id number] Patient Account Number: [id number] Date of Birth/Sex: Treating RN: 05/08/1965 (56 y.o. [name] Primary Care Merridy Pascoe: [name] Other Clinician: Referring Naliyah Neth: Treating Jeremaine Maraj/Extender: [name] Weeks in Treatment: 32 Vital Signs Time Taken: 11:44 Temperature (F): 98.5 Pulse (bpm): 93 Respiratory Rate (breaths/min): 18 Blood Pressure (mmHg): 138/86 Reference Range: 80 - 120 mg / dl Electronic Signature(s) Signed: 11/07/2021 3:54:56 PM By: Erenest Blank Entered By: Erenest Blank on 11/07/2021 11:45:44

## 2021-11-21 ENCOUNTER — Other Ambulatory Visit (HOSPITAL_COMMUNITY): Payer: Self-pay

## 2021-11-22 ENCOUNTER — Encounter (HOSPITAL_BASED_OUTPATIENT_CLINIC_OR_DEPARTMENT_OTHER): Payer: No Typology Code available for payment source | Admitting: Internal Medicine

## 2021-11-22 DIAGNOSIS — L98424 Non-pressure chronic ulcer of back with necrosis of bone: Secondary | ICD-10-CM | POA: Diagnosis not present

## 2021-11-22 DIAGNOSIS — E11622 Type 2 diabetes mellitus with other skin ulcer: Secondary | ICD-10-CM | POA: Diagnosis not present

## 2021-11-22 NOTE — Progress Notes (Addendum)
KODI, STEIL ([id number]) Visit Report for 11/22/2021 Chief Complaint Document Details Patient Name: Date of Service: [name], [name] 11/22/2021 10:45 A M Medical Record Number: [id number] Patient Account Number: [id number] Date of Birth/Sex: Treating RN: March 03, 1966 (56 y.o. M) Primary Care Provider: [name] Other Clinician: Referring Provider: Treating Provider/Extender: [name] Weeks in Treatment: 16 Information Obtained from: Patient Chief Complaint 03/28/2021; Back wound Electronic Signature(s) Signed: 11/22/2021 1:41:40 PM By: Kalman Shan DO Entered By: Kalman Shan on 11/22/2021 12:41:20 -------------------------------------------------------------------------------- Cellular or Tissue Based Product Details Patient Name: Date of Service: Christian Brown E. 11/22/2021 10:45 A M Medical Record Number: [id number] Patient Account Number: [id number] Date of Birth/Sex: Treating RN: July 18, 1965 (56 y.o. [name], [name] Primary Care Provider: [name] Other Clinician: Referring Provider: Treating Provider/Extender: [name] in Treatment: 34 Cellular or Tissue Based Product Type Wound #1 Midline Back Applied to: Performed By: Physician Kalman Shan, DO Cellular or Tissue Based Product Type: Epicord Level of Consciousness (Pre-procedure): Awake and Alert Pre-procedure Verification/Time Out Yes - 11:26 Taken: Location: trunk / arms / legs Wound Size (sq cm): 4.68 Product Size (sq cm): 6 Waste Size (sq cm): 0 Amount of Product Applied (sq cm): 6 Instrument Used: Forceps, Scissors Lot #: [id number] Expiration Date: 08/13/2026 Fenestrated: No Reconstituted: Yes Solution Type: normal Saline Solution Amount: 64m Lot #: [id number] Expiration Date: 01/12/2022 Secured: Yes Secured With: Steri-Strips Dressing Applied: Yes Primary Dressing: Adaptic Procedural Pain: 0 Post Procedural Pain: 0 Response to  Treatment: Procedure was tolerated well Level of Consciousness (Post- Awake and Alert procedure): Post Procedure Diagnosis Same as Pre-procedure Electronic Signature(s) Signed: 11/22/2021 12:27:41 PM By: BRhae HammockRN Signed: 11/22/2021 1:41:40 PM By: HKalman ShanDO Entered By: BRhae Hammockon 11/22/2021 11:27:23 -------------------------------------------------------------------------------- Debridement Details Patient Name: Date of Service: [name] 11/22/2021 10:45 A M Medical Record Number: [id number] Account Number: [id number] of Birth/Sex: Treating RN: 3Jun 30, 1967(56y.o.  MBurnadette Pop [name] Primary Care Provider: PKathlene NovemberOther Clinician: Referring Provider: Treating Provider/Extender: HFreddi StarrWeeks in Treatment: 34 Debridement Performed for Assessment: Wound #1 Midline Back Performed By: Physician HKalman Shan DO Debridement Type: Debridement Level of Consciousness (Pre-procedure): Awake and Alert Pre-procedure Verification/Time Out Yes - 11:24 Taken: Start Time: 11:24 Pain Control: Lidocaine T Area Debrided (L x W): otal 5.2 (cm) x 0.9 (cm) = 4.68 (cm) Tissue and other material debrided: Viable, Non-Viable, Slough, Subcutaneous, Slough Level: Skin/Subcutaneous Tissue Debridement Description: Excisional Instrument: Curette Bleeding: Minimum Hemostasis Achieved: Pressure End Time: 11:24 Procedural Pain: 0 Post Procedural Pain: 0 Response to Treatment: Procedure was tolerated well Level of Consciousness (Post- Awake and Alert procedure): Post Debridement Measurements of Total Wound Length: (cm) 5.2 Width: (cm) 0.9 Depth: (cm) 0.7 Volume: (cm) 2.573 Character of Wound/Ulcer Post Debridement: Improved Post Procedure Diagnosis Same as Pre-procedure Electronic Signature(s) Signed: 11/22/2021 12:27:41 PM By: BRhae HammockRN Signed: 11/22/2021 1:41:40 PM By: HKalman ShanDO Entered By:  BRhae Hammockon 11/22/2021 11:25:51 -------------------------------------------------------------------------------- HPI Details Patient Name: Date of Service: [name] 11/22/2021 10:45 A M Medical Record Number: [id number] Account Number: [id number] of Birth/Sex: Treating RN: 311-Sep-1967(56y.o. M) Primary Care Provider: PKathlene NovemberOther Clinician: Referring Provider: Treating Provider/Extender: HFreddi StarrWeeks in Treatment: 334History of Present Illness HPI Description: Admission 03/28/2021 Mr. CSouleymane Saikiis a 56year old male with a past medical history of controlled type 2 diabetes on oral agents, obesity and OSA that presents  to the clinic for a back wound. On 01/11/2021 patient had a laminectomy with PLIF of L1-S1 by Dr. Venetia Constable because of lumbar stenosis and radiculopathy. He subsequently developed bacteremia. He had CT imaging on 10/13 of the lumbar spine that showed fluid collection in the soft tissue of the posterior L1 and S1 and was taken to the OR for washout on 10/14. 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 He received 4 weeks of IV antibiotics by infectious disease. After his completion of 4 weeks of IV antibiotics he was continued for an additional 4 weeks of IV cefazolin with a stop date of 12/29. He has been evaluated by plastic surgery and no plans for surgical intervention at this time. Wife is present and reports he has been on the wound VAC for the past 8 weeks with improvement in wound healing. He currently denies systemic signs of infection. 12/22; patient presents for follow-up. He reports no issues since last clinic visit. He denies signs of infection. He has been tolerating the wound VAC well. 12/30; patient presents for follow-up. He reports no issues and has no complaints today. He has been tolerating the wound VAC well. 1/9; patient presents for follow-up. He has no issues or complaints  today. He states he feels well. He has had no problems with the wound VAC. 1/16; patient presents for follow-up. He continues to use the wound VAC with no issues. He denies signs of infection. 1/23; patient presents for follow-up. He has been switched from IV cefazolin to oral cefadroxil by infectious disease. He has no issues or complaints today. He denies signs of infection. He continues to tolerate the wound VAC well. 1/30; patient presents for follow-up. He continues to tolerate the wound VAC well. 2/6; patient presents for follow-up. He has no issues or complaints today. He continues to tolerate the wound VAC well. He denies signs of infection. 2/13; patient presents for follow-up. He continues to do well with the wound VAC. He denies any issues. 2/27; patient presents for follow-up. He continues to use the wound VAC without any issues. He denies signs of infection. 3/20; patient presents for follow-up. He has no issues or complaints today. He continues to use the wound VAC. 4/3; patient presents for follow-up. He continues to use the wound VAC without issues. He denies signs of infection. 4/17; 2-week follow-up. He continues to do well. His measurements are improved. Initially a surgical wound complicated by infection. 5/1; patient presents for follow-up. He has no issues or complaints today. He continues to tolerate the wound VAC well. He denies signs of infection. 5/15; patient presents for follow-up. He has noted more maceration to the periwound. He has been using the wound VAC without issues. He currently denies signs of infection. 5/30; patient presents for follow-up. He has been tolerating the wound VAC well over the past 2 weeks. He no longer has maceration to the periwound. He has no issues or complaints today. 6/8; this patient with a postsurgical wound that was complicated by postop infection. He has been using silver collagen under wound VAC and gradually doing well improvement in  dimensions especially the tunnel at 12:00 6/22; patient presents for follow-up. We have been using silver collagen under the wound VAC. Patient has no issues or questions today. 7/6; patient presents for follow-up. Patient continues to use collagen under the wound VAC with no issues. 7/20; patient presents for follow-up. He has been using collagen under the wound VAC with no issues.  He has been approved for Epicord. This was discussed with the patient and he is in agreement to having this placed at next clinic visit. 7/27; patient presents for follow-up. He has been tolerating the wound VAC well with collagen underneath. He has been approved for epi cord and we have this to place in office today. 8/4; patient presents for follow-up. He had Epicort placed at last clinic visit. We held off on the wound VAC. He tolerated this well. He has no issues or complaints today. 8/11; patient presents for follow-up. Epicord was placed at last clinic visit. He had the wound VAC on for the past week. He states that no drainage was suctioned into the canister. He has slight maceration to the periwound. Electronic Signature(s) Signed: 11/22/2021 1:41:40 PM By: Kalman Shan DO Entered By: Kalman Shan on 11/22/2021 12:42:24 -------------------------------------------------------------------------------- Physical Exam Details Patient Name: Date of Service: Christian Brown E. 11/22/2021 10:45 A M Medical Record Number: [id number] Patient Account Number: [id number] Date of Birth/Sex: Treating RN: 02-Oct-1965 (56 y.o. M) Primary Care Provider: [name] Other Clinician: Referring Provider: Treating Provider/Extender: [name] Weeks in Treatment: 49 Constitutional respirations regular, non-labored and within target range for patient.Marland Kitchen Psychiatric pleasant and cooperative. Notes T the lumbar spine over an incision site there is a dehisced wound with tunneling at the 12 o'clock position.  Granulation tissue and nonviable tissue present. No o surrounding signs of infection. Slight maceration to the periwound. Electronic Signature(s) Signed: 11/22/2021 1:41:40 PM By: Kalman Shan DO Entered By: Kalman Shan on 11/22/2021 12:43:12 -------------------------------------------------------------------------------- Physician Orders Details Patient Name: Date of Service: Christian Brown E. 11/22/2021 10:45 A M Medical Record Number: [id number] Patient Account Number: [id number] Date of Birth/Sex: Treating RN: February 23, 1966 (56 y.o. [name] Primary Care Provider: [name] Other Clinician: Referring Provider: Treating Provider/Extender: [name] Weeks in Treatment: 76 Verbal / Phone Orders: No Diagnosis Coding Follow-up Appointments ppointment in 1 week. - Friday 11/29/21 @ 1015 w/ Dr. Heber [place] and Allayne Butcher, Room 9 Return A ***For Epicord placement*** ppointment in 2 weeks. - Friday 12/06/21 @ 1015 w/ Dr. Heber [place] and Elias Else # 9 Return A Cellular or Tissue Based Products Cellular or Tissue Based Product Type: - Epicord #3 applied 11/22/21 daptic or Mepitel. (DO NOT REMOVE). - leave Cellular or Tissue Based Product applied to wound bed, secured with steri-strips, cover with A the adaptic and steri-strips in place. Bathing/ Shower/ Hygiene May shower with protection but do not get wound dressing(s) wet. Negative Presssure Wound Therapy Wound Vac to wound continuously at 152m/hg pressure - Hold vac today to let maceration clear up and reapply next week if everything looks good. Use duoderm to surrounding skin before applying wound vac tape. Additional Orders / Instructions Follow Nutritious Diet - Continue to monitor blood sugars daily Wound Treatment Wound #1 - Back Wound Laterality: Midline Prim Dressing: Epicord 1 x Per Day/30 Days ary Discharge Instructions: applied by provider directly to wound bed. Secondary Dressing: Zetuvit Plus  Silicone Border Dressing 7x7(in/in) (DME) (Generic) 1 x Per Day/30 Days Discharge Instructions: Apply silicone border over primary dressing as directed. Electronic Signature(s) Signed: 11/22/2021 1:41:40 PM By: HKalman ShanDO Previous Signature: 11/22/2021 12:27:41 PM Version By: BRhae HammockRN Entered By: HKalman Shanon 11/22/2021 12:43:22 -------------------------------------------------------------------------------- Problem List Details Patient Name: Date of Service: [name] 11/22/2021 10:45 A M Medical Record Number: [id number] Account Number: [id number] of Birth/Sex: Treating RN: 3Aug 14, 1967(56y.o. 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 11/22/2021 10:45 A M Medical Record Number: [id number] Patient Account Number: [id number] Date of Birth/Sex: Treating RN: 23-Apr-1965 (56 y.o. M) Primary Care Provider: [name] Other Clinician: Referring Provider: Treating Provider/Extender: [name] Weeks in Treatment: 59 Subjective Chief Complaint Information obtained from Patient 03/28/2021; Back wound History of Present Illness (HPI) Admission 03/28/2021 Mr. Nic Lampe is a 56 year old male with a past medical history of  controlled type 2 diabetes on oral agents, obesity and OSA that presents to the clinic for a back wound. On 01/11/2021 patient had a laminectomy with PLIF of L1-S1 by Dr. Venetia Constable because of lumbar stenosis and radiculopathy. He subsequently developed bacteremia. He had CT imaging on 10/13 of the lumbar spine that showed fluid collection in the soft tissue of the posterior L1 and S1 and was taken to the OR for washout on 10/14. 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 He received 4 weeks of IV antibiotics by infectious disease. After his completion of 4 weeks of IV antibiotics he was continued for an additional 4 weeks of IV cefazolin with a stop date of 12/29. He has been evaluated by plastic surgery and no plans for surgical intervention at this time. Wife is present and reports he has been on the wound VAC for the past 8 weeks with improvement in wound healing. He currently denies systemic signs of infection. 12/22; patient presents for follow-up. He reports no issues since last clinic visit. He denies signs of infection. He has been tolerating the wound VAC well. 12/30; patient presents for follow-up. He reports no issues and has no complaints today. He has been tolerating the wound VAC well. 1/9; patient presents for follow-up. He has no issues or complaints today. He states he feels well. He has had no problems with the wound VAC. 1/16; patient presents for follow-up. He continues to use the wound VAC with no issues. He denies signs of infection. 1/23; patient presents for follow-up. He has been switched from IV cefazolin to oral cefadroxil by infectious disease. He has no issues or complaints today. He denies signs of infection. He continues to tolerate the wound VAC well. 1/30; patient presents for follow-up. He continues to tolerate the wound VAC well. 2/6; patient presents for follow-up. He has no issues or complaints today. He continues to tolerate the wound  VAC well. He denies signs of infection. 2/13; patient presents for follow-up. He continues to do well with the wound VAC. He denies any issues. 2/27; patient presents for follow-up. He continues to use the wound VAC without any issues. He denies signs of infection. 3/20; patient presents for follow-up. He has no issues or complaints today. He continues to use the wound VAC. 4/3; patient presents for follow-up. He continues to use the wound VAC without issues. He denies signs of infection. 4/17; 2-week follow-up. He continues to do well. His measurements are improved. Initially a surgical wound complicated by infection. 5/1; patient presents  for follow-up. He has no issues or complaints today. He continues to tolerate the wound VAC well. He denies signs of infection. 5/15; patient presents for follow-up. He has noted more maceration to the periwound. He has been using the wound VAC without issues. He currently denies signs of infection. 5/30; patient presents for follow-up. He has been tolerating the wound VAC well over the past 2 weeks. He no longer has maceration to the periwound. He has no issues or complaints today. 6/8; this patient with a postsurgical wound that was complicated by postop infection. He has been using silver collagen under wound VAC and gradually doing well improvement in dimensions especially the tunnel at 12:00 6/22; patient presents for follow-up. We have been using silver collagen under the wound VAC. Patient has no issues or questions today. 7/6; patient presents for follow-up. Patient continues to use collagen under the wound VAC with no issues. 7/20; patient presents for follow-up. He has been using collagen under the wound VAC with no issues. He has been approved for Epicord. This was discussed with the patient and he is in agreement to having this placed at next clinic visit. 7/27; patient presents for follow-up. He has been tolerating the wound VAC well with collagen  underneath. He has been approved for epi cord and we have this to place in office today. 8/4; patient presents for follow-up. He had Epicort placed at last clinic visit. We held off on the wound VAC. He tolerated this well. He has no issues or complaints today. 8/11; patient presents for follow-up. Epicord was placed at last clinic visit. He had the wound VAC on for the past week. He states that no drainage was suctioned into the canister. He has slight maceration to the periwound. Patient History Information obtained from Patient. Family History Diabetes - Mother, Stroke - Siblings, No family history of Cancer, Heart Disease, Hereditary Spherocytosis, Hypertension, Kidney Disease, Lung Disease, Seizures, Thyroid Problems, Tuberculosis. Social History Never smoker, Marital Status - Married, Alcohol Use - Rarely, Drug Use - No History, Caffeine Use - Rarely. Medical History Cardiovascular Patient has history of Hypertension Endocrine Patient has history of Type II Diabetes Musculoskeletal Patient has history of Osteomyelitis Medical A Surgical History Notes nd Musculoskeletal DDD Objective Constitutional respirations regular, non-labored and within target range for patient.. Vitals Time Taken: 11:02 AM, Temperature: 98.5 F, Pulse: 101 bpm, Respiratory Rate: 20 breaths/min, Blood Pressure: 149/76 mmHg. Psychiatric pleasant and cooperative. General Notes: T the lumbar spine over an incision site there is a dehisced wound with tunneling at the 12 o'clock position. Granulation tissue and nonviable o tissue present. No surrounding signs of infection. Slight maceration to the periwound. Integumentary (Hair, Skin) Wound #1 status is Open. Original cause of wound was Surgical Injury. The date acquired was: 01/11/2021. The wound has been in treatment 34 weeks. The wound is located on the Midline Back. The wound measures 5.2cm length x 0.9cm width x 0.7cm depth; 3.676cm^2 area and 2.573cm^3  volume. There is Fat Layer (Subcutaneous Tissue) exposed. There is no undermining noted, however, there is tunneling at 12:00 with a maximum distance of 1.2cm. There is a medium amount of serosanguineous drainage noted. Foul odor after cleansing was noted. Foul odor after cleansing was noted due to product use. The wound margin is distinct with the outline attached to the wound base. There is large (67-100%) red granulation within the wound bed. There is no necrotic tissue within the wound bed. General Notes: maceration to periwound. 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 I debrided nonviable tissue. Due to maceration I recommended holding the wound VAC again for 1 week. Epicord #3 was placed in standard fashion. Follow-up in 1 week. Procedures Wound #1 Pre-procedure diagnosis of Wound #1 is an Open Surgical Wound located on the Midline Back . There was a Excisional Skin/Subcutaneous Tissue Debridement with a total area of 4.68 sq cm performed by Kalman Shan, DO. With the following instrument(s): Curette to remove Viable and Non-Viable tissue/material. Material removed includes Subcutaneous Tissue and Slough and after achieving pain control using Lidocaine. No specimens were taken. A time out was conducted at 11:24, prior to the start of the procedure. A Minimum amount of bleeding was controlled with Pressure. The procedure was tolerated well with a pain level of 0 throughout and a pain level of 0 following the procedure. Post Debridement Measurements: 5.2cm length x 0.9cm width x 0.7cm depth; 2.573cm^3 volume. Character of Wound/Ulcer Post Debridement is improved. Post procedure Diagnosis Wound #1: Same as Pre-Procedure Pre-procedure diagnosis of Wound #1 is an Open Surgical  Wound located on the Midline Back. A skin graft procedure using a bioengineered skin substitute/cellular or tissue based product was performed by Kalman Shan, DO with the following instrument(s): Forceps and Scissors. Epicord was applied and secured with Steri-Strips. 6 sq cm of product was utilized and 0 sq cm was wasted. Post Application, Adaptic was applied. A Time Out was conducted at 11:26, prior to the start of the procedure. The procedure was tolerated well with a pain level of 0 throughout and a pain level of 0 following the procedure. Post procedure Diagnosis Wound #1: Same as Pre-Procedure . Plan Follow-up Appointments: Return Appointment in 1 week. - Friday 11/29/21 @ 1015 w/ Dr. Heber [place] and Allayne Butcher, Room 9 ***For Epicord placement*** Return Appointment in 2 weeks. - Friday 12/06/21 @ 1015 w/ Dr. Heber [place] and Allayne Butcher, Nadeen Landau # 9 Cellular or Tissue Based Products: Cellular or Tissue Based Product Type: - Epicord #3 applied 11/22/21 Cellular or Tissue Based Product applied to wound bed, secured with steri-strips, cover with Adaptic or Mepitel. (DO NOT REMOVE). - leave the adaptic and steri-strips in place. Bathing/ Shower/ Hygiene: May shower with protection but do not get wound dressing(s) wet. Negative Presssure Wound Therapy: Wound Vac to wound continuously at 172m/hg pressure - Hold vac today to let maceration clear up and reapply next week if everything looks good. Use duoderm to surrounding skin before applying wound vac tape. Additional Orders / Instructions: Follow Nutritious Diet - Continue to monitor blood sugars daily WOUND #1: - Back Wound Laterality: Midline Prim Dressing: Epicord 1 x Per Day/30 Days ary Discharge Instructions: applied by provider directly to wound bed. Secondary Dressing: Zetuvit Plus Silicone Border Dressing 7x7(in/in) (DME) (Generic) 1 x Per Day/30 Days Discharge Instructions: Apply silicone border over primary dressing as directed. 1. In office  sharp debridement 2. Epicord #3 placed in standard fashion 3. Hold wound VAC for 1 week 4. Follow-up in 1 week Electronic Signature(s) Signed: 11/22/2021 1:41:40 PM By: HKalman ShanDO Entered By: HKalman Shanon 11/22/2021 12:46:41 -------------------------------------------------------------------------------- HxROS Details Patient Name: Date of Service: [name] 11/22/2021 10:45 A M Medical Record Number: [id number] Account Number: [id number] of Birth/Sex: Treating RN: 3Jan 07, 1967(56y.o. M) Primary Care Provider: PKathlene NovemberOther Clinician: Referring Provider: Treating Provider/Extender: HHeber   [place], Jose Weeks in Treatment: 34 Information Obtained From Patient Cardiovascular Medical History: Positive for: Hypertension Endocrine Medical History: Positive for: Type II Diabetes Time with diabetes: Since mid 90's Treated with: Oral agents Blood sugar tested every day: Yes Tested : 2x day Musculoskeletal Medical History: Positive for: Osteomyelitis Past Medical History Notes: DDD Immunizations Pneumococcal Vaccine: Received Pneumococcal Vaccination: Yes Received Pneumococcal Vaccination On or After 60th Birthday: No Implantable Devices Yes Family and Social History Cancer: No; Diabetes: Yes - Mother; Heart Disease: No; Hereditary Spherocytosis: No; Hypertension: No; Kidney Disease: No; Lung Disease: No; Seizures: No; Stroke: Yes - Siblings; Thyroid Problems: No; Tuberculosis: No; Never smoker; Marital Status - Married; Alcohol Use: Rarely; Drug Use: No History; Caffeine Use: Rarely; Financial Concerns: No; Food, Clothing or Shelter Needs: No; Support System Lacking: No; Transportation Concerns: No Electronic Signature(s) Signed: 11/22/2021 1:41:40 PM By: Kalman Shan DO Entered By: Kalman Shan on 11/22/2021 12:42:29 -------------------------------------------------------------------------------- SuperBill Details Patient  Name: Date of Service: Christian Grout. 11/22/2021 Medical Record Number: [id number] Patient Account Number: [id number] Date of Birth/Sex: Treating RN: 1965-11-30 (56 y.o. 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

## 2021-11-22 NOTE — Progress Notes (Signed)
COBE, VINEY ([id number]) Visit Report for 11/22/2021 Arrival Information Details Patient Name: Date of Service: SAMIK, BALKCOM 11/22/2021 10:45 A M Medical Record Number: [id number] Patient Account Number: [id number] Date of Birth/Sex: Treating RN: Aug 23, 1965 (56 y.o. [name] Primary Care Kelsey Edman: [name] Other Clinician: Referring Darleene Cumpian: Treating Caasi Giglia/Extender: [name] in Treatment: 23 Visit Information History Since Last Visit Added or deleted any medications: No Patient Arrived: Ambulatory Any new allergies or adverse reactions: No Arrival Time: 11:02 Had a fall or experienced change in No Accompanied By: self activities of daily living that may affect Transfer Assistance: None risk of falls: Patient Identification Verified: Yes Signs or symptoms of abuse/neglect since last visito No Secondary Verification Process Completed: Yes Hospitalized since last visit: No Patient Requires Transmission-Based Precautions: No Implantable device outside of the clinic excluding No Patient Has Alerts: Yes cellular tissue based products placed in the center Patient Alerts: Patient on Blood Thinner since last visit: No BP Right Arm-PICC Has Dressing in Place as Prescribed: Yes Pain Present Now: No Electronic Signature(s) Signed: 11/22/2021 5:06:10 PM By: Deon Pilling RN, BSN Entered By: Deon Pilling on 11/22/2021 11:02:47 -------------------------------------------------------------------------------- Encounter Discharge Information Details Patient Name: Date of Service: [name] 11/22/2021 10:45 A M Medical Record Number: [id number] Patient Account Number: [id number] Date of Birth/Sex: Treating RN: Aug 08, 1965 (56 y.o. [name], [name] Primary Care Keyonda Bickle: [name] Other Clinician: Referring Kodi Guerrera: Treating Yorley Buch/Extender: [name] Weeks in Treatment: 34 Encounter Discharge Information Items Post  Procedure Vitals Discharge Condition: Stable Temperature (F): 97.7 Ambulatory Status: Ambulatory Pulse (bpm): 76 Discharge Destination: Home Respiratory Rate (breaths/min): 17 Transportation: Private Auto Blood Pressure (mmHg): 134/74 Accompanied By: self Schedule Follow-up Appointment: Yes Clinical Summary of Care: Patient Declined Electronic Signature(s) Signed: 11/22/2021 12:27:41 PM By: [name] RN Entered By: [name] on 11/22/2021 11:33:52 -------------------------------------------------------------------------------- Lower Extremity Assessment Details Patient Name: Date of Service: [name] 11/22/2021 10:45 A M Medical Record Number: [id number] Patient Account Number: [id number] Date of Birth/Sex: Treating RN: [birth year] (56 y.o. [name] Primary Care Analeia Ismael: [name] Other Clinician: Referring Rhythm Gubbels: Treating Kewanna Kasprzak/Extender: [name] Weeks in Treatment: 34 Electronic Signature(s) Signed: 11/22/2021 5:06:10 PM By: Deon Pilling RN, BSN Entered By: Deon Pilling on 11/22/2021 11:03:40 -------------------------------------------------------------------------------- Multi Wound Chart Details Patient Name: Date of Service: [name] 11/22/2021 10:45 A M Medical Record Number: [id number] Patient Account Number: [id number] Date of Birth/Sex: Treating RN: April 06, 1966 (56 y.o. M) Primary Care Carolee Channell: [name] Other Clinician: Referring Jyssica Rief: Treating Rowena Moilanen/Extender: [name] Weeks in Treatment: 34 Vital Signs Height(in): Pulse(bpm): 101 Weight(lbs): Blood Pressure(mmHg): 149/76 Body Mass Index(BMI): Temperature(F): 98.5 Respiratory Rate(breaths/min): 20 Photos: [N/A:N/A] Midline Back N/A N/A Wound Location: Surgical Injury N/A N/A Wounding Event: Open Surgical Wound N/A N/A Primary Etiology: Hypertension, Type II Diabetes, N/A N/A Comorbid  History: Osteomyelitis 01/11/2021 N/A N/A Date Acquired: 13 N/A N/A Weeks of Treatment: Open N/A N/A Wound Status: No N/A N/A Wound Recurrence: 5.2x0.9x0.7 N/A N/A Measurements L x W x D (cm) 3.676 N/A N/A A (cm) : rea 2.573 N/A N/A Volume (cm) : 95.10% N/A N/A % Reduction in A rea: 98.50% N/A N/A % Reduction in Volume: 12 Position 1 (o'clock): 1.2 Maximum Distance 1 (cm): Yes N/A N/A Tunneling: Full Thickness With Exposed Support N/A N/A Classification: Structures Medium N/A N/A Exudate Amount: Serosanguineous N/A N/A Exudate Type: red, brown N/A N/A Exudate Color: Yes N/A N/A Foul Odor A  Cleansing: fter Yes N/A N/A Odor Anticipated Due to Product Use: Distinct, outline attached N/A N/A Wound Margin: Large (67-100%) N/A N/A Granulation A mount: Red N/A N/A Granulation Quality: None Present (0%) N/A N/A Necrotic Amount: Fat Layer (Subcutaneous Tissue): Yes N/A N/A Exposed Structures: Fascia: No Tendon: No Muscle: No Joint: No Bone: No Medium (34-66%) N/A N/A Epithelialization: Debridement - Excisional N/A N/A Debridement: Pre-procedure Verification/Time Out 11:24 N/A N/A Taken: Lidocaine N/A N/A Pain Control: Subcutaneous, Slough N/A N/A Tissue Debrided: Skin/Subcutaneous Tissue N/A N/A Level: 4.68 N/A N/A Debridement A (sq cm): rea Curette N/A N/A Instrument: Minimum N/A N/A Bleeding: Pressure N/A N/A Hemostasis A chieved: 0 N/A N/A Procedural Pain: 0 N/A N/A Post Procedural Pain: Procedure was tolerated well N/A N/A Debridement Treatment Response: 5.2x0.9x0.7 N/A N/A Post Debridement Measurements L x W x D (cm) 2.573 N/A N/A Post Debridement Volume: (cm) maceration to periwound. N/A N/A Assessment Notes: Cellular or Tissue Based Product N/A N/A Procedures Performed: Debridement Treatment Notes Wound #1 (Back) Wound Laterality: Midline Cleanser Peri-Wound Care Topical Primary Dressing Epicord Discharge  Instruction: applied by Travia Onstad directly to wound bed. Secondary Dressing Zetuvit Plus Silicone Border Dressing 7x7(in/in) Discharge Instruction: Apply silicone border over primary dressing as directed. Secured With Compression Wrap Compression Stockings Add-Ons Electronic Signature(s) Signed: 11/22/2021 1:41:40 PM By: Kalman Shan DO Entered By: Kalman Shan on 11/22/2021 12:41:12 -------------------------------------------------------------------------------- Multi-Disciplinary Care Plan Details Patient Name: Date of Service: [name] 11/22/2021 10:45 A M Medical Record Number: [id number] Patient Account Number: [id number] Date of Birth/Sex: Treating RN: Feb 22, 1966 (56 y.o. [name], [name] Primary Care Shalayne Leach: [name] Other Clinician: Referring Malyk Girouard: Treating Milianna Ericsson/Extender: [name] Weeks in Treatment: [place] reviewed with physician Active Inactive Wound/Skin Impairment Nursing Diagnoses: Impaired tissue integrity Goals: Patient/caregiver will verbalize understanding of skin care regimen Date Initiated: 03/28/2021 Target Resolution Date: 12/27/2021 Goal Status: Active Ulcer/skin breakdown will have a volume reduction of 30% by week 4 Date Initiated: 03/28/2021 Date Inactivated: 05/27/2021 Target Resolution Date: 05/31/2021 Goal Status: Met Interventions: Assess patient/caregiver ability to obtain necessary supplies Assess patient/caregiver ability to perform ulcer/skin care regimen upon admission and as needed Assess ulceration(s) every visit Provide education on ulcer and skin care Treatment Activities: Topical wound management initiated : 03/28/2021 Notes: Electronic Signature(s) Signed: 11/22/2021 12:27:41 PM By: [name] RN Entered By: [name] on 11/22/2021 11:32:26 -------------------------------------------------------------------------------- Pain Assessment  Details Patient Name: Date of Service: [name] 11/22/2021 10:45 A M Medical Record Number: [id number] Patient Account Number: [id number] Date of Birth/Sex: Treating RN: 11-Jan-1966 (56 y.o. [name] Primary Care Jerald Villalona: [name] Other Clinician: Referring Latonja Bobeck: Treating Raoul Ciano/Extender: [name] Weeks in Treatment: 34 Active Problems Location of Pain Severity and Description of Pain Patient Has Paino No Site Locations Rate the pain. Current Pain Level: 0 Pain Management and Medication Current Pain Management: Medication: No Cold Application: No Rest: No Massage: No Activity: No T.E.N.S.: No Heat Application: No Leg drop or elevation: No Is the Current Pain Management Adequate: Adequate How does your wound impact your activities of daily livingo Sleep: No Bathing: No Appetite: No Relationship With Others: No Bladder Continence: No Emotions: No Bowel Continence: No Work: No Toileting: No Drive: No Dressing: No Hobbies: No Engineer, maintenance) Signed: 11/22/2021 5:06:10 PM By: Deon Pilling RN, BSN Entered By: Deon Pilling on 11/22/2021 11:03:36 -------------------------------------------------------------------------------- Patient/Caregiver Education Details Patient Name: Date of Service: [name] 8/11/2023andnbsp10:45 A M Medical Record Number: [id number] Patient Account  Number: [id number] Date of Birth/Gender: Treating RN: 1965/09/28 (56 y.o. [name] Primary Care Physician: [name] Other Clinician: Referring Physician: Treating Physician/Extender: [name] in Treatment: 18 Education Assessment Education Provided To: Patient Education Topics Provided Wound/Skin Impairment: Methods: Explain/Verbal Responses: Reinforcements needed, State content correctly Electronic Signature(s) Signed: 11/22/2021 12:27:41 PM By: [name] RN Entered By: [name]  on 11/22/2021 11:32:37 -------------------------------------------------------------------------------- Wound Assessment Details Patient Name: Date of Service: [name] 11/22/2021 10:45 A M Medical Record Number: [id number] Patient Account Number: [id number] Date of Birth/Sex: Treating RN: 01/15/1966 (56 y.o. [name] Primary Care Artha Chiasson: [name] Other Clinician: Referring Nihar Klus: Treating Jem Castro/Extender: [name] Weeks in Treatment: 34 Wound Status Wound Number: 1 Primary Etiology: Open Surgical Wound Wound Location: Midline Back Wound Status: Open Wounding Event: Surgical Injury Comorbid History: Hypertension, Type II Diabetes, Osteomyelitis Date Acquired: 01/11/2021 Weeks Of Treatment: 34 Clustered Wound: No Photos Wound Measurements Length: (cm) 5.2 Width: (cm) 0.9 Depth: (cm) 0.7 Area: (cm) 3.676 Volume: (cm) 2.573 % Reduction in Area: 95.1% % Reduction in Volume: 98.5% Epithelialization: Medium (34-66%) Tunneling: Yes Position (o'clock): 12 Maximum Distance: (cm) 1.2 Undermining: No Wound Description Classification: Full Thickness With Exposed Support Structures Wound Margin: Distinct, outline attached Exudate Amount: Medium Exudate Type: Serosanguineous Exudate Color: red, brown Foul Odor After Cleansing: Yes Due to Product Use: Yes Slough/Fibrino No Wound Bed Granulation Amount: Large (67-100%) Exposed Structure Granulation Quality: Red Fascia Exposed: No Necrotic Amount: None Present (0%) Fat Layer (Subcutaneous Tissue) Exposed: Yes Tendon Exposed: No Muscle Exposed: No Joint Exposed: No Bone Exposed: No Assessment Notes maceration to periwound. Treatment Notes Wound #1 (Back) Wound Laterality: Midline Cleanser Peri-Wound Care Topical Primary Dressing Epicord Discharge Instruction: applied by Aljean Horiuchi directly to wound bed. Secondary Dressing Zetuvit Plus Silicone Border Dressing  7x7(in/in) Discharge Instruction: Apply silicone border over primary dressing as directed. Secured With Compression Wrap Compression Stockings Environmental education officer) Signed: 11/22/2021 5:06:10 PM By: Deon Pilling RN, BSN Entered By: Deon Pilling on 11/22/2021 11:09:42 -------------------------------------------------------------------------------- Vitals Details Patient Name: Date of Service: [name] 11/22/2021 10:45 A M Medical Record Number: [id number] Patient Account Number: [id number] Date of Birth/Sex: Treating RN: 03/12/1966 (56 y.o. [name] Primary Care Humza Tallerico: [name] Other Clinician: Referring Sami Froh: Treating Oliverio Cho/Extender: [name] Weeks in Treatment: 34 Vital Signs Time Taken: 11:02 Temperature (F): 98.5 Pulse (bpm): 101 Respiratory Rate (breaths/min): 20 Blood Pressure (mmHg): 149/76 Reference Range: 80 - 120 mg / dl Electronic Signature(s) Signed: 11/22/2021 5:06:10 PM By: Deon Pilling RN, BSN Entered By: Deon Pilling on 11/22/2021 11:03:28

## 2021-11-25 ENCOUNTER — Other Ambulatory Visit (HOSPITAL_COMMUNITY): Payer: Self-pay

## 2021-11-27 ENCOUNTER — Other Ambulatory Visit (HOSPITAL_COMMUNITY): Payer: Self-pay

## 2021-11-28 ENCOUNTER — Encounter: Payer: Self-pay | Admitting: Internal Medicine

## 2021-11-29 ENCOUNTER — Encounter (HOSPITAL_BASED_OUTPATIENT_CLINIC_OR_DEPARTMENT_OTHER): Payer: No Typology Code available for payment source | Admitting: Internal Medicine

## 2021-11-29 DIAGNOSIS — E11622 Type 2 diabetes mellitus with other skin ulcer: Secondary | ICD-10-CM | POA: Diagnosis not present

## 2021-11-29 DIAGNOSIS — L98424 Non-pressure chronic ulcer of back with necrosis of bone: Secondary | ICD-10-CM

## 2021-11-29 NOTE — Progress Notes (Signed)
ZAMIRE, WHITEHURST ([id number]) Visit Report for 11/29/2021 Arrival Information Details Patient Name: Date of Service: KASTON, FAUGHN 11/29/2021 10:15 A M Medical Record Number: [id number] Patient Account Number: [id number] Date of Birth/Sex: Treating RN: 15-Nov-1965 (56 y.o. [name] Primary Care Alijah Hyde: [name] Other Clinician: Referring Tylene Quashie: Treating Jaculin Rasmus/Extender: [name] Weeks in Treatment: 51 Visit Information History Since Last Visit Added or deleted any medications: No Patient Arrived: Ambulatory Any new allergies or adverse reactions: No Arrival Time: 10:34 Had a fall or experienced change in No Accompanied By: self activities of daily living that may affect Transfer Assistance: None risk of falls: Patient Identification Verified: Yes Signs or symptoms of abuse/neglect since last visito No Secondary Verification Process Completed: Yes Hospitalized since last visit: No Patient Requires Transmission-Based Precautions: No Implantable device outside of the clinic excluding No Patient Has Alerts: Yes cellular tissue based products placed in the center Patient Alerts: Patient on Blood Thinner since last visit: No BP Right Arm-PICC Has Dressing in Place as Prescribed: Yes Pain Present Now: Yes Electronic Signature(s) Signed: 11/29/2021 5:13:04 PM By: Deon Pilling RN, BSN Entered By: Deon Pilling on 11/29/2021 10:34:45 -------------------------------------------------------------------------------- Encounter Discharge Information Details Patient Name: Date of Service: [name] 11/29/2021 10:15 A M Medical Record Number: [id number] Patient Account Number: [id number] Date of Birth/Sex: Treating RN: 1965/10/30 (56 y.o. [name] Primary Care Lakeshia Dohner: [name] Other Clinician: Referring Eiliana Drone: Treating Jenella Craigie/Extender: [name] in Treatment: 35 Encounter Discharge Information Items Post Procedure  Vitals Discharge Condition: Stable Temperature (F): 98.1 Ambulatory Status: Ambulatory Pulse (bpm): 83 Discharge Destination: Home Respiratory Rate (breaths/min): 20 Transportation: Private Auto Blood Pressure (mmHg): 130/78 Schedule Follow-up Appointment: Yes Clinical Summary of Care: Provided on 11/29/2021 Form Type Recipient Paper Patient Patient Electronic Signature(s) Signed: 11/29/2021 11:20:00 AM By: Lorrin Jackson Entered By: Lorrin Jackson on 11/29/2021 11:20:00 -------------------------------------------------------------------------------- Lower Extremity Assessment Details Patient Name: Date of Service: MILIANO, COTTEN 11/29/2021 10:15 A M Medical Record Number: [id number] Patient Account Number: [id number] Date of Birth/Sex: Treating RN: [birth year] (56 y.o. [name] Primary Care Trigger Frasier: [name] Other Clinician: Referring Katrell Milhorn: Treating Dashiel Bergquist/Extender: [name] Weeks in Treatment: 35 Electronic Signature(s) Signed: 11/29/2021 5:13:04 PM By: Deon Pilling RN, BSN Entered By: Deon Pilling on 11/29/2021 10:35:04 -------------------------------------------------------------------------------- Multi Wound Chart Details Patient Name: Date of Service: [name] 11/29/2021 10:15 A M Medical Record Number: [id number] Patient Account Number: [id number] Date of Birth/Sex: Treating RN: Aug 23, 1965 (56 y.o. M) Primary Care Keane Martelli: [name] Other Clinician: Referring India Jolin: Treating Jeret Goyer/Extender: [name] Weeks in Treatment: 35 Vital Signs Height(in): Pulse(bpm): 83 Weight(lbs): Blood Pressure(mmHg): 130/78 Body Mass Index(BMI): Temperature(F): 98.1 Respiratory Rate(breaths/min): 20 Photos: [N/A:N/A] Midline Back N/A N/A Wound Location: Surgical Injury N/A N/A Wounding Event: Open Surgical Wound N/A N/A Primary Etiology: Hypertension, Type II Diabetes, N/A N/A Comorbid  History: Osteomyelitis 01/11/2021 N/A N/A Date Acquired: 35 N/A N/A Weeks of Treatment: Open N/A N/A Wound Status: No N/A N/A Wound Recurrence: 5x0.8x0.5 N/A N/A Measurements L x W x D (cm) 3.142 N/A N/A A (cm) : rea 1.571 N/A N/A Volume (cm) : 95.80% N/A N/A % Reduction in A rea: 99.10% N/A N/A % Reduction in Volume: 12 Position 1 (o'clock): 1.2 Maximum Distance 1 (cm): Yes N/A N/A Tunneling: Full Thickness With Exposed Support N/A N/A Classification: Structures Medium N/A N/A Exudate Amount: Serosanguineous N/A N/A Exudate Type: red, brown N/A N/A Exudate Color: Distinct, outline attached  N/A N/A Wound Margin: Large (67-100%) N/A N/A Granulation Amount: Red N/A N/A Granulation Quality: Small (1-33%) N/A N/A Necrotic Amount: Fat Layer (Subcutaneous Tissue): Yes N/A N/A Exposed Structures: Fascia: No Tendon: No Muscle: No Joint: No Bone: No Large (67-100%) N/A N/A Epithelialization: Debridement - Excisional N/A N/A Debridement: Pre-procedure Verification/Time Out 10:50 N/A N/A Taken: Lidocaine 4% Topical Solution N/A N/A Pain Control: Subcutaneous, Slough N/A N/A Tissue Debrided: Skin/Subcutaneous Tissue N/A N/A Level: 4 N/A N/A Debridement A (sq cm): rea Curette N/A N/A Instrument: Minimum N/A N/A Bleeding: Pressure N/A N/A Hemostasis A chieved: Procedure was tolerated well N/A N/A Debridement Treatment Response: 5x0.8x0.5 N/A N/A Post Debridement Measurements L x W x D (cm) 1.571 N/A N/A Post Debridement Volume: (cm) Cellular or Tissue Based Product N/A N/A Procedures Performed: Debridement Treatment Notes Electronic Signature(s) Signed: 11/29/2021 11:05:41 AM By: Kalman Shan DO Entered By: Kalman Shan on 11/29/2021 11:01:09 -------------------------------------------------------------------------------- Multi-Disciplinary Care Plan Details Patient Name: Date of Service: [name] 11/29/2021 10:15 A M Medical  Record Number: [id number] Patient Account Number: [id number] Date of Birth/Sex: Treating RN: Nov 07, 1965 (56 y.o. [name] Primary Care Manford Sprong: [name] Other Clinician: Referring Blayne Garlick: Treating Tamber Burtch/Extender: [name] in Treatment: [place] reviewed with physician Active Inactive Wound/Skin Impairment Nursing Diagnoses: Impaired tissue integrity Goals: Patient/caregiver will verbalize understanding of skin care regimen Date Initiated: 03/28/2021 Target Resolution Date: 12/27/2021 Goal Status: Active Ulcer/skin breakdown will have a volume reduction of 30% by week 4 Date Initiated: 03/28/2021 Date Inactivated: 05/27/2021 Target Resolution Date: 05/31/2021 Goal Status: Met Interventions: Assess patient/caregiver ability to obtain necessary supplies Assess patient/caregiver ability to perform ulcer/skin care regimen upon admission and as needed Assess ulceration(s) every visit Provide education on ulcer and skin care Treatment Activities: Topical wound management initiated : 03/28/2021 Notes: Electronic Signature(s) Signed: 11/29/2021 1:15:54 PM By: Lorrin Jackson Entered By: Lorrin Jackson on 11/29/2021 11:01:09 -------------------------------------------------------------------------------- Pain Assessment Details Patient Name: Date of Service: NELVIN, TOMB 11/29/2021 10:15 A M Medical Record Number: [id number] Patient Account Number: [id number] Date of Birth/Sex: Treating RN: [birth year] (56 y.o. [name] Primary Care Adham Johnson: [name] Other Clinician: Referring Ginamarie Banfield: Treating Richards Pherigo/Extender: [name] Weeks in Treatment: 35 Active Problems Location of Pain Severity and Description of Pain Patient Has Paino Yes Site Locations Pain Location: Generalized Pain, Pain in Ulcers Rate the pain. Current Pain Level: 6 Pain Management and Medication Current Pain  Management: Medication: No Cold Application: No Rest: No Massage: No Activity: No T.E.N.S.: No Heat Application: No Leg drop or elevation: No Is the Current Pain Management Adequate: Adequate How does your wound impact your activities of daily livingo Sleep: No Bathing: No Appetite: No Relationship With Others: No Bladder Continence: No Emotions: No Bowel Continence: No Work: No Toileting: No Drive: No Dressing: No Hobbies: No Engineer, maintenance) Signed: 11/29/2021 5:13:04 PM By: Deon Pilling RN, BSN Entered By: Deon Pilling on 11/29/2021 10:34:59 -------------------------------------------------------------------------------- Patient/Caregiver Education Details Patient Name: Date of Service: [name] 8/18/2023andnbsp10:15 A M Medical Record Number: [id number] Patient Account Number: [id number] Date of Birth/Gender: Treating RN: 1965-10-03 (56 y.o. [name] Primary Care Physician: [name] Other Clinician: Referring Physician: Treating Physician/Extender: [name] in Treatment: 35 Education Assessment Education Provided To: Patient Education Topics Provided Wound/Skin Impairment: Methods: Demonstration, Explain/Verbal, Printed Responses: State content correctly Electronic Signature(s) Signed: 11/29/2021 1:15:54 PM By: Lorrin Jackson Entered By: Lorrin Jackson on 11/29/2021 11:01:25 -------------------------------------------------------------------------------- Wound Assessment Details Patient  Name: Date of Service: EVERARD, INTERRANTE 11/29/2021 10:15 A M Medical Record Number: [id number] Patient Account Number: [id number] Date of Birth/Sex: Treating RN: 06/16/1965 (56 y.o. M) Primary Care Corian Handley: [name] Other Clinician: Referring Bode Pieper: Treating Jordynne Mccown/Extender: [name] Weeks in Treatment: 35 Wound Status Wound Number: 1 Primary Etiology: Open Surgical Wound Wound Location: Midline  Back Wound Status: Open Wounding Event: Surgical Injury Comorbid History: Hypertension, Type II Diabetes, Osteomyelitis Date Acquired: 01/11/2021 Weeks Of Treatment: 35 Clustered Wound: No Photos Wound Measurements Length: (cm) 5 Width: (cm) 0.8 Depth: (cm) 0.5 Area: (cm) 3.142 Volume: (cm) 1.571 % Reduction in Area: 95.8% % Reduction in Volume: 99.1% Epithelialization: Large (67-100%) Tunneling: Yes Position (o'clock): 12 Maximum Distance: (cm) 1.2 Undermining: No Wound Description Classification: Full Thickness With Exposed Support Structures Wound Margin: Distinct, outline attached Exudate Amount: Medium Exudate Type: Serosanguineous Exudate Color: red, brown Foul Odor After Cleansing: No Slough/Fibrino Yes Wound Bed Granulation Amount: Large (67-100%) Exposed Structure Granulation Quality: Red Fascia Exposed: No Necrotic Amount: Small (1-33%) Fat Layer (Subcutaneous Tissue) Exposed: Yes Necrotic Quality: Adherent Slough Tendon Exposed: No Muscle Exposed: No Joint Exposed: No Bone Exposed: No Treatment Notes Wound #1 (Back) Wound Laterality: Midline Cleanser Soap and Water Discharge Instruction: May shower and wash wound with dial antibacterial soap and water prior to dressing change. Wound Cleanser Discharge Instruction: Cleanse the wound with wound cleanser prior to applying a clean dressing using gauze sponges, not tissue or cotton balls. Peri-Wound Care Topical Primary Dressing Epicord Discharge Instruction: applied by Camil Wilhelmsen directly to wound bed. Secondary Dressing ADAPTIC TOUCH 3x4.25 in Discharge Instruction: Apply over primary dressing as directed. Zetuvit Plus Silicone Border Dressing 7x7(in/in) Discharge Instruction: Apply silicone border over primary dressing as directed. Secured With Compression Wrap Compression Stockings Add-Ons Electronic Signature(s) Signed: 11/29/2021 12:32:17 PM By: Erenest Blank Entered By: Erenest Blank on  11/29/2021 10:42:38 -------------------------------------------------------------------------------- Vitals Details Patient Name: Date of Service: [name] 11/29/2021 10:15 A M Medical Record Number: [id number] Patient Account Number: [id number] Date of Birth/Sex: Treating RN: [birth year] (56 y.o. [name] Primary Care Kla Bily: [name] Other Clinician: Referring Dorothie Wah: Treating Boubacar Lerette/Extender: [name] Weeks in Treatment: 35 Vital Signs Time Taken: 10:35 Temperature (F): 98.1 Pulse (bpm): 83 Respiratory Rate (breaths/min): 20 Blood Pressure (mmHg): 130/78 Reference Range: 80 - 120 mg / dl Electronic Signature(s) Signed: 11/29/2021 5:13:04 PM By: Deon Pilling RN, BSN Entered By: Deon Pilling on 11/29/2021 10:35:57

## 2021-11-29 NOTE — Progress Notes (Signed)
Christian, TOKARZ ([id number]) Visit Report for 11/29/2021 Chief Complaint Document Details Patient Name: Date of Service: Christian, Murphy 11/29/2021 10:15 A M Medical Record Number: [id number] Patient Account Number: [id number] Date of Birth/Sex: Treating RN: [birth year] (56 y.o. M) Primary Care Provider: Kathlene Murphy Other Clinician: Referring Provider: Treating Provider/Extender: [name] in Treatment: 35 Information Obtained from: Patient Chief Complaint 03/28/2021; Back wound Electronic Signature(s) Signed: 11/29/2021 11:05:41 AM By: Kalman Shan DO Entered By: Kalman Shan on 11/29/2021 11:01:19 -------------------------------------------------------------------------------- Cellular or Tissue Based Product Details Patient Name: Date of Service: [name] E. 11/29/2021 10:15 A M Medical Record Number: [id number] Patient Account Number: [id number] Date of Birth/Sex: Treating RN: 1965/08/08 (56 y.o. [name] Primary Care Provider: Kathlene Murphy Other Clinician: Referring Provider: Treating Provider/Extender: [name] in Treatment: 35 Cellular or Tissue Based Product Type Wound #1 Midline Back Applied to: Performed By: Physician Kalman Shan, DO Cellular or Tissue Based Product Type: Epicord Level of Consciousness (Pre-procedure): Awake and Alert Pre-procedure Verification/Time Out Yes - 10:50 Taken: Location: trunk / arms / legs Wound Size (sq cm): 4 Product Size (sq cm): 6 Waste Size (sq cm): 0 Amount of Product Applied (sq cm): 6 Instrument Used: Forceps Lot #: [id number] Expiration Date: 04/17/2026 Reconstituted: Yes Solution Type: Normal saline Solution Amount: 76m Lot #: [id number] Expiration Date: 01/12/2022 Secured: Yes Secured With: Steri-Strips Dressing Applied: Yes Primary Dressing: Adaptic Level of Consciousness (Post- Awake and Alert procedure): Post Procedure Diagnosis Same as  Pre-procedure Electronic Signature(s) Signed: 11/29/2021 11:05:41 AM By: HKalman ShanDO Signed: 11/29/2021 1:15:54 PM By: BLorrin JacksonEntered By: BLorrin Jacksonon 11/29/2021 10:58:15 -------------------------------------------------------------------------------- Debridement Details Patient Name: Date of Service: [name] 11/29/2021 10:15 A M Medical Record Number: [id number] Account Number: [id number] of Birth/Sex: Treating RN: 3July 06, 1967(56y.o. MMarcheta GrammesPrimary Care Provider: PKathlene NovemberOther Clinician: Referring Provider: Treating Provider/Extender: HFreddi StarrWeeks in Treatment: 35 Debridement Performed for Assessment: Wound #1 Midline Back Performed By: Physician HKalman Shan DO Debridement Type: Debridement Level of Consciousness (Pre-procedure): Awake and Alert Pre-procedure Verification/Time Out Yes - 10:50 Taken: Start Time: 10:51 Pain Control: Lidocaine 4% T opical Solution T Area Debrided (L x W): otal 5 (cm) x 0.8 (cm) = 4 (cm) Tissue and other material debrided: Non-Viable, Slough, Subcutaneous, Slough Level: Skin/Subcutaneous Tissue Debridement Description: Excisional Instrument: Curette Bleeding: Minimum Hemostasis Achieved: Pressure End Time: 10:54 Response to Treatment: Procedure was tolerated well Level of Consciousness (Post- Awake and Alert procedure): Post Debridement Measurements of Total Wound Length: (cm) 5 Width: (cm) 0.8 Depth: (cm) 0.5 Volume: (cm) 1.571 Character of Wound/Ulcer Post Debridement: Stable Post Procedure Diagnosis Same as Pre-procedure Electronic Signature(s) Signed: 11/29/2021 11:05:41 AM By: HKalman ShanDO Signed: 11/29/2021 1:15:54 PM By: BLorrin JacksonEntered By: BLorrin Jacksonon 11/29/2021 10:54:19 -------------------------------------------------------------------------------- HPI Details Patient Name: Date of Service: [name] 11/29/2021 10:15 A  M Medical Record Number: [id number] Account Number: [id number] of Birth/Sex: Treating RN: 309-Jun-1967(56y.o. M) Primary Care Provider: PKathlene NovemberOther Clinician: Referring Provider: Treating Provider/Extender: HFreddi StarrWeeks in Treatment: 35 History of Present Illness HPI Description: Admission 03/28/2021 [name] a 56year old male with a past medical history of controlled type 2 diabetes on oral agents, obesity and OSA that presents to the clinic for a back wound. On 01/11/2021 patient had a laminectomy with PLIF of L1-S1 by Dr. OVenetia Constablebecause of lumbar  stenosis and radiculopathy. He subsequently developed bacteremia. He had CT imaging on 10/13 of the lumbar spine that showed fluid collection in the soft tissue of the posterior L1 and S1 and was taken to the OR for washout on 10/14. 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 He received 4 Murphy of IV antibiotics by infectious disease. After his completion of 4 Murphy of IV antibiotics he was continued for an additional 4 Murphy of IV cefazolin with a stop date of 12/29. He has been evaluated by plastic surgery and no plans for surgical intervention at this time. Wife is present and reports he has been on the wound VAC for the past 8 Murphy with improvement in wound healing. He currently denies systemic signs of infection. 12/22; patient presents for follow-up. He reports no issues since last clinic visit. He denies signs of infection. He has been tolerating the wound VAC well. 12/30; patient presents for follow-up. He reports no issues and has no complaints today. He has been tolerating the wound VAC well. 1/9; patient presents for follow-up. He has no issues or complaints today. He states he feels well. He has had no problems with the wound VAC. 1/16; patient presents for follow-up. He continues to use the wound VAC with no issues. He denies signs of infection. 1/23;  patient presents for follow-up. He has been switched from IV cefazolin to oral cefadroxil by infectious disease. He has no issues or complaints today. He denies signs of infection. He continues to tolerate the wound VAC well. 1/30; patient presents for follow-up. He continues to tolerate the wound VAC well. 2/6; patient presents for follow-up. He has no issues or complaints today. He continues to tolerate the wound VAC well. He denies signs of infection. 2/13; patient presents for follow-up. He continues to do well with the wound VAC. He denies any issues. 2/27; patient presents for follow-up. He continues to use the wound VAC without any issues. He denies signs of infection. 3/20; patient presents for follow-up. He has no issues or complaints today. He continues to use the wound VAC. 4/3; patient presents for follow-up. He continues to use the wound VAC without issues. He denies signs of infection. 4/17; 2-week follow-up. He continues to do well. His measurements are improved. Initially a surgical wound complicated by infection. 5/1; patient presents for follow-up. He has no issues or complaints today. He continues to tolerate the wound VAC well. He denies signs of infection. 5/15; patient presents for follow-up. He has noted more maceration to the periwound. He has been using the wound VAC without issues. He currently denies signs of infection. 5/30; patient presents for follow-up. He has been tolerating the wound VAC well over the past 2 Murphy. He no longer has maceration to the periwound. He has no issues or complaints today. 6/8; this patient with a postsurgical wound that was complicated by postop infection. He has been using silver collagen under wound VAC and gradually doing well improvement in dimensions especially the tunnel at 12:00 6/22; patient presents for follow-up. We have been using silver collagen under the wound VAC. Patient has no issues or questions today. 7/6; patient presents  for follow-up. Patient continues to use collagen under the wound VAC with no issues. 7/20; patient presents for follow-up. He has been using collagen under the wound VAC with no issues. He has been approved for Epicord. This was discussed with the patient and he is in agreement to having this placed at next  clinic visit. 7/27; patient presents for follow-up. He has been tolerating the wound VAC well with collagen underneath. He has been approved for epi cord and we have this to place in office today. 8/4; patient presents for follow-up. He had Epicort placed at last clinic visit. We held off on the wound VAC. He tolerated this well. He has no issues or complaints today. 8/11; patient presents for follow-up. Epicord was placed at last clinic visit. He had the wound VAC on for the past week. He states that no drainage was suctioned into the canister. He has slight maceration to the periwound. 8/18; patient presents for follow-up. Epicord #3 was placed in standard fashion last week. The wound VAC was held. He has no issues or complaints today. Electronic Signature(s) Signed: 11/29/2021 11:05:41 AM By: Kalman Shan DO Entered By: Kalman Shan on 11/29/2021 11:02:04 -------------------------------------------------------------------------------- Physical Exam Details Patient Name: Date of Service: ZAE, KIRTZ 11/29/2021 10:15 A M Medical Record Number: [id number] Patient Account Number: [id number] Date of Birth/Sex: Treating RN: 09/29/1965 (56 y.o. M) Primary Care Provider: Kathlene Murphy Other Clinician: Referring Provider: Treating Provider/Extender: [name] in Treatment: 35 Constitutional respirations regular, non-labored and within target range for patient.. Cardiovascular 2+ dorsalis pedis/posterior tibialis pulses. Psychiatric pleasant and cooperative. Notes T the lumbar spine over an incision site there is a dehisced wound with tunneling at the 12 o'clock  position. Granulation tissue and nonviable tissue present. No o surrounding signs of infection. Electronic Signature(s) Signed: 11/29/2021 11:05:41 AM By: Kalman Shan DO Entered By: Kalman Shan on 11/29/2021 11:02:59 -------------------------------------------------------------------------------- Physician Orders Details Patient Name: Date of Service: [name] E. 11/29/2021 10:15 A M Medical Record Number: [id number] Patient Account Number: [id number] Date of Birth/Sex: Treating RN: 1965-10-04 (56 y.o. [name] Primary Care Provider: Kathlene Murphy Other Clinician: Referring Provider: Treating Provider/Extender: [name] in Treatment: [phone] Verbal / Phone Orders: No Diagnosis Coding Follow-up Appointments ppointment in 1 week. - Friday 12/06/21 @ 1015 w/ Dr. Heber Barlow and Allayne Butcher, Rm # 9 Return A Anesthetic (In clinic) Topical Lidocaine 5% applied to wound bed (In clinic) Topical Lidocaine 4% applied to wound bed Cellular or Tissue Based Products Cellular or Tissue Based Product Type: - Epicord #3 applied 11/22/21, Epicord #4 11/29/21 daptic or Mepitel. (DO NOT REMOVE). - leave Cellular or Tissue Based Product applied to wound bed, secured with steri-strips, cover with A the adaptic and steri-strips in place. Bathing/ Shower/ Hygiene May shower with protection but do not get wound dressing(s) wet. Negative Presssure Wound Therapy Wound Vac to wound continuously at 166m/hg pressure - Hold vac today Additional Orders / Instructions Follow Nutritious Diet - Continue to monitor blood sugars daily Wound Treatment Wound #1 - Back Wound Laterality: Midline Cleanser: Soap and Water 1 x Per Day/30 Days Discharge Instructions: May shower and wash wound with dial antibacterial soap and water prior to dressing change. Cleanser: Wound Cleanser 1 x Per Day/30 Days Discharge Instructions: Cleanse the wound with wound cleanser prior to applying a clean  dressing using gauze sponges, not tissue or cotton balls. Prim Dressing: Epicord 1 x Per Day/30 Days ary Discharge Instructions: applied by provider directly to wound bed. Secondary Dressing: ADAPTIC TOUCH 3x4.25 in 1 x Per Day/30 Days Discharge Instructions: Apply over primary dressing as directed. Secondary Dressing: Zetuvit Plus Silicone Border Dressing 7x7(in/in) (Generic) 1 x Per Day/30 Days Discharge Instructions: Apply silicone border over primary dressing as directed. Electronic Signature(s) Signed: 11/29/2021 11:05:41 AM By: HHeber [place]  [name] DO Entered By: Kalman Shan on 11/29/2021 11:03:19 -------------------------------------------------------------------------------- Problem List Details Patient Name: Date of Service: Christian, Murphy 11/29/2021 10:15 A M Medical Record Number: [id number] Patient Account Number: [id number] Date of Birth/Sex: Treating RN: [birth year] (56 y.o. 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 11/29/2021 10:15 A M Medical Record Number: [id number] Patient Account Number: [id number] Date of Birth/Sex: Treating RN: May 20, 1965 (56 y.o. M) Primary Care Provider: Kathlene Murphy Other Clinician: Referring Provider: Treating Provider/Extender: [name] in Treatment: 35 Subjective Chief Complaint Information obtained from Patient 03/28/2021; Back wound History of Present Illness (HPI) Admission 03/28/2021 [name] is a 56 year old male with a past medical history of controlled type 2 diabetes on oral agents, obesity and OSA that presents to the clinic for a back wound. On 01/11/2021 patient had a laminectomy with PLIF of L1-S1 by Dr. Venetia Constable because of lumbar stenosis and radiculopathy. He subsequently developed bacteremia. He had CT imaging on 10/13 of the lumbar spine that showed fluid collection in the soft tissue of the posterior L1 and S1 and was taken to the OR for washout on 10/14. 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 He received 4 Murphy of IV antibiotics by infectious disease. After his completion of 4 Murphy of IV antibiotics he was continued for an additional 4 Murphy of IV cefazolin with a stop date of 12/29. He has been evaluated by plastic surgery and no plans for surgical intervention at this time. Wife is present and reports he has been on the wound VAC for the past 8 Murphy with improvement in wound healing. He currently denies systemic signs of infection. 12/22; patient presents for follow-up. He reports no issues since last clinic visit. He denies signs of infection. He has been tolerating the wound VAC well. 12/30; patient presents for follow-up. He reports no issues and has no complaints today. He has been tolerating the wound VAC well. 1/9; patient presents for follow-up. He has no issues or complaints today. He states he feels well. He has had no problems with the wound VAC. 1/16; patient presents for follow-up. He continues to use the wound VAC with no issues. He denies signs of infection. 1/23; patient presents for follow-up. He has been switched from IV cefazolin to oral cefadroxil by infectious disease. He has no issues or complaints today. He denies signs  of infection. He continues to tolerate the wound VAC well. 1/30; patient presents for follow-up. He continues to tolerate the wound VAC well. 2/6; patient presents for follow-up. He has no issues or complaints today. He continues to tolerate the wound VAC well. He denies signs of infection. 2/13; patient presents for follow-up. He continues to do well with the wound VAC. He denies any issues. 2/27; patient presents for follow-up. He continues to use the wound VAC without any issues. He denies signs of infection. 3/20; patient presents for follow-up. He has no issues or complaints today. He continues  to use the wound VAC. 4/3; patient presents for follow-up. He continues to use the wound VAC without issues. He denies signs of infection. 4/17; 2-week follow-up. He continues to do well. His measurements are improved. Initially a surgical wound complicated by infection. 5/1; patient presents for follow-up. He has no issues or complaints today. He continues to tolerate the wound VAC well. He denies signs of infection. 5/15; patient presents for follow-up. He has noted more maceration to the periwound. He has been using the wound VAC without issues. He currently denies signs of infection. 5/30; patient presents for follow-up. He has been tolerating the wound VAC well over the past 2 Murphy. He no longer has maceration to the periwound. He has no issues or complaints today. 6/8; this patient with a postsurgical wound that was complicated by postop infection. He has been using silver collagen under wound VAC and gradually doing well improvement in dimensions especially the tunnel at 12:00 6/22; patient presents for follow-up. We have been using silver collagen under the wound VAC. Patient has no issues or questions today. 7/6; patient presents for follow-up. Patient continues to use collagen under the wound VAC with no issues. 7/20; patient presents for follow-up. He has been using collagen under the wound  VAC with no issues. He has been approved for Epicord. This was discussed with the patient and he is in agreement to having this placed at next clinic visit. 7/27; patient presents for follow-up. He has been tolerating the wound VAC well with collagen underneath. He has been approved for epi cord and we have this to place in office today. 8/4; patient presents for follow-up. He had Epicort placed at last clinic visit. We held off on the wound VAC. He tolerated this well. He has no issues or complaints today. 8/11; patient presents for follow-up. Epicord was placed at last clinic visit. He had the wound VAC on for the past week. He states that no drainage was suctioned into the canister. He has slight maceration to the periwound. 8/18; patient presents for follow-up. Epicord #3 was placed in standard fashion last week. The wound VAC was held. He has no issues or complaints today. Patient History Information obtained from Patient. Family History Diabetes - Mother, Stroke - Siblings, No family history of Cancer, Heart Disease, Hereditary Spherocytosis, Hypertension, Kidney Disease, Lung Disease, Seizures, Thyroid Problems, Tuberculosis. Social History Never smoker, Marital Status - Married, Alcohol Use - Rarely, Drug Use - No History, Caffeine Use - Rarely. Medical History Cardiovascular Patient has history of Hypertension Endocrine Patient has history of Type II Diabetes Musculoskeletal Patient has history of Osteomyelitis Medical A Surgical History Notes nd Musculoskeletal DDD Objective Constitutional respirations regular, non-labored and within target range for patient.. Vitals Time Taken: 10:35 AM, Temperature: 98.1 F, Pulse: 83 bpm, Respiratory Rate: 20 breaths/min, Blood Pressure: 130/78 mmHg. Cardiovascular 2+ dorsalis pedis/posterior tibialis pulses. Psychiatric pleasant and cooperative. General Notes: T the lumbar spine over an incision site there is a dehisced wound with  tunneling at the 12 o'clock position. Granulation tissue and nonviable o tissue present. No surrounding signs of infection. Integumentary (Hair, Skin) Wound #1 status is Open. Original cause of wound was Surgical Injury. The date acquired was: 01/11/2021. The wound has been in treatment 35 Murphy. The wound is located on the Midline Back. The wound measures 5cm length x 0.8cm width x 0.5cm depth; 3.142cm^2 area and 1.571cm^3 volume. There is Fat Layer (Subcutaneous Tissue) exposed. There is no undermining noted, however, there is tunneling at  12:00 with a maximum distance of 1.2cm. There is a medium amount of serosanguineous drainage noted. The wound margin is distinct with the outline attached to the wound base. There is large (67-100%) red granulation within the wound bed. There is a small (1-33%) amount of necrotic tissue within the wound bed including Adherent Slough. 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 I debrided nonviable tissue. Epicord #4 was placed in standard fashion today. We will continue to hold the wound VAC for now. Procedures Wound #1 Pre-procedure diagnosis of Wound #1 is an Open Surgical Wound located on the Midline Back . There was a Excisional Skin/Subcutaneous Tissue Debridement with a total area of 4 sq cm performed by Kalman Shan, DO. With the following instrument(s): Curette to remove Non-Viable tissue/material. Material removed includes Subcutaneous Tissue and Slough and after achieving pain control using Lidocaine 4% T opical Solution. No specimens were taken. A time out was conducted at 10:50, prior to the start of the procedure. A Minimum amount of bleeding was controlled with Pressure. The procedure was tolerated well. Post Debridement Measurements:  5cm length x 0.8cm width x 0.5cm depth; 1.571cm^3 volume. Character of Wound/Ulcer Post Debridement is stable. Post procedure Diagnosis Wound #1: Same as Pre-Procedure Pre-procedure diagnosis of Wound #1 is an Open Surgical Wound located on the Midline Back. A skin graft procedure using a bioengineered skin substitute/cellular or tissue based product was performed by Kalman Shan, DO with the following instrument(s): Forceps. Epicord was applied and secured with Steri-Strips. 6 sq cm of product was utilized and 0 sq cm was wasted. Post Application, Adaptic was applied. A Time Out was conducted at 10:50, prior to the start of the procedure. Post procedure Diagnosis Wound #1: Same as Pre-Procedure . Plan Follow-up Appointments: Return Appointment in 1 week. - Friday 12/06/21 @ 1015 w/ Dr. Heber [place] and Allayne Butcher, Rm # 9 Anesthetic: (In clinic) Topical Lidocaine 5% applied to wound bed (In clinic) Topical Lidocaine 4% applied to wound bed Cellular or Tissue Based Products: Cellular or Tissue Based Product Type: - Epicord #3 applied 11/22/21, Epicord #4 11/29/21 Cellular or Tissue Based Product applied to wound bed, secured with steri-strips, cover with Adaptic or Mepitel. (DO NOT REMOVE). - leave the adaptic and steri-strips in place. Bathing/ Shower/ Hygiene: May shower with protection but do not get wound dressing(s) wet. Negative Presssure Wound Therapy: Wound Vac to wound continuously at 130m/hg pressure - Hold vac today Additional Orders / Instructions: Follow Nutritious Diet - Continue to monitor blood sugars daily WOUND #1: - Back Wound Laterality: Midline Cleanser: Soap and Water 1 x Per Day/30 Days Discharge Instructions: May shower and wash wound with dial antibacterial soap and water prior to dressing change. Cleanser: Wound Cleanser 1 x Per Day/30 Days Discharge Instructions: Cleanse the wound with wound cleanser prior to applying a clean dressing using gauze sponges, not tissue  or cotton balls. Prim Dressing: Epicord 1 x Per Day/30 Days ary Discharge Instructions: applied by provider directly to wound bed. Secondary Dressing: ADAPTIC TOUCH 3x4.25 in 1 x Per Day/30 Days Discharge Instructions: Apply over primary dressing as directed. Secondary Dressing: Zetuvit Plus Silicone Border Dressing 7x7(in/in) (Generic) 1 x Per Day/30 Days Discharge Instructions: Apply silicone border over primary dressing as directed. 1. In office sharp debridement 2. Epicord #4 placed in standard fashion 3. Follow-up in 1 week  Electronic Signature(s) Signed: 11/29/2021 11:05:41 AM By: Kalman Shan DO Entered By: Kalman Shan on 11/29/2021 11:04:46 -------------------------------------------------------------------------------- HxROS Details Patient Name: Date of Service: [name] E. 11/29/2021 10:15 A M Medical Record Number: [id number] Patient Account Number: [id number] Date of Birth/Sex: Treating RN: 08-Apr-1966 (56 y.o. M) Primary Care Provider: Kathlene Murphy Other Clinician: Referring Provider: Treating Provider/Extender: [name] in Treatment: 41 Information Obtained From Patient Cardiovascular Medical History: Positive for: Hypertension Endocrine Medical History: Positive for: Type II Diabetes Time with diabetes: Since mid 90's Treated with: Oral agents Blood sugar tested every day: Yes Tested : 2x day Musculoskeletal Medical History: Positive for: Osteomyelitis Past Medical History Notes: DDD Immunizations Pneumococcal Vaccine: Received Pneumococcal Vaccination: Yes Received Pneumococcal Vaccination On or After 60th Birthday: No Implantable Devices Yes Family and Social History Cancer: No; Diabetes: Yes - Mother; Heart Disease: No; Hereditary Spherocytosis: No; Hypertension: No; Kidney Disease: No; Lung Disease: No; Seizures: No; Stroke: Yes - Siblings; Thyroid Problems: No; Tuberculosis: No; Never smoker; Marital Status -  Married; Alcohol Use: Rarely; Drug Use: No History; Caffeine Use: Rarely; Financial Concerns: No; Food, Clothing or Shelter Needs: No; Support System Lacking: No; Transportation Concerns: No Electronic Signature(s) Signed: 11/29/2021 11:05:41 AM By: Kalman Shan DO Entered By: Kalman Shan on 11/29/2021 11:02:20 -------------------------------------------------------------------------------- SuperBill Details Patient Name: Date of Service: [name]. 11/29/2021 Medical Record Number: [id number] Patient Account Number: [id number] Date of Birth/Sex: Treating RN: [birth year] (56 y.o. 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 Modifier: Quantity: 6 CPT4 Code: [id number] Description: 84210 - SKIN SUB GRAFT TRNK/ARM/LEG ICD-10 Diagnosis Description L98.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

## 2021-12-02 ENCOUNTER — Other Ambulatory Visit (HOSPITAL_COMMUNITY): Payer: Self-pay

## 2021-12-04 ENCOUNTER — Encounter: Payer: Self-pay | Admitting: Physical Medicine and Rehabilitation

## 2021-12-04 ENCOUNTER — Encounter
Payer: No Typology Code available for payment source | Attending: Physical Medicine and Rehabilitation | Admitting: Physical Medicine and Rehabilitation

## 2021-12-04 ENCOUNTER — Other Ambulatory Visit (HOSPITAL_COMMUNITY): Payer: Self-pay

## 2021-12-04 VITALS — BP 127/78 | HR 81 | Ht 74.0 in | Wt 282.6 lb

## 2021-12-04 DIAGNOSIS — G894 Chronic pain syndrome: Secondary | ICD-10-CM | POA: Diagnosis present

## 2021-12-04 DIAGNOSIS — Z5181 Encounter for therapeutic drug level monitoring: Secondary | ICD-10-CM | POA: Insufficient documentation

## 2021-12-04 DIAGNOSIS — Z79891 Long term (current) use of opiate analgesic: Secondary | ICD-10-CM | POA: Insufficient documentation

## 2021-12-04 DIAGNOSIS — M7918 Myalgia, other site: Secondary | ICD-10-CM | POA: Diagnosis present

## 2021-12-04 DIAGNOSIS — M4646 Discitis, unspecified, lumbar region: Secondary | ICD-10-CM | POA: Diagnosis present

## 2021-12-04 DIAGNOSIS — K5901 Slow transit constipation: Secondary | ICD-10-CM | POA: Insufficient documentation

## 2021-12-04 MED ORDER — MORPHINE SULFATE ER 15 MG PO TBCR
30.0000 mg | EXTENDED_RELEASE_TABLET | Freq: Two times a day (BID) | ORAL | 0 refills | Status: DC
  Filled 2021-12-04: qty 120, 30d supply, fill #0
  Filled 2021-12-06: qty 120, 40d supply, fill #0

## 2021-12-04 MED ORDER — DICLOFENAC SODIUM 75 MG PO TBEC
75.0000 mg | DELAYED_RELEASE_TABLET | Freq: Every day | ORAL | 5 refills | Status: DC | PRN
  Filled 2021-12-04: qty 20, 20d supply, fill #0
  Filled 2021-12-23: qty 20, 20d supply, fill #1
  Filled 2022-01-12 – 2022-01-23 (×2): qty 20, 20d supply, fill #2
  Filled 2022-02-24: qty 20, 20d supply, fill #3
  Filled 2022-04-17: qty 20, 20d supply, fill #4
  Filled 2022-06-13: qty 20, 20d supply, fill #5

## 2021-12-04 MED ORDER — TIZANIDINE HCL 4 MG PO TABS
4.0000 mg | ORAL_TABLET | Freq: Two times a day (BID) | ORAL | 5 refills | Status: DC
  Filled 2021-12-04 – 2022-01-01 (×3): qty 60, 30d supply, fill #0
  Filled 2022-01-24 – 2022-02-05 (×2): qty 60, 30d supply, fill #1
  Filled 2022-02-28: qty 60, 30d supply, fill #2
  Filled 2022-04-01: qty 60, 30d supply, fill #3
  Filled 2022-04-29: qty 60, 30d supply, fill #4
  Filled 2022-06-09: qty 60, 30d supply, fill #5

## 2021-12-04 NOTE — Patient Instructions (Signed)
Plan: Will try, when next Rx is due (just wrote for it, but not due to be picked up til 12/19/21)- will try to do 15 mg in Am and 30 mg nightly for MS Contin- x 2 weeks and if tolerated, then can reduce to 15 mg 2x/day- don't try to reduce less than that.  2. Don't decrease Tizanidine right now- don't want to make multiple changes at once.    3.  Etodolac is at home- has taken for years- so will switch up and try Diclofenac 75 mg daily prn #20- try to take no more than 3-5 x/week- can also use it with the Voltaren gel- at the same time-    4. Needs to do home exercise program- 10 minutes/day.    5. Can go to gym once wound is completely healed.    6. F/U 3 months -double visit-    7. UDS due today since still on pain meds.

## 2021-12-04 NOTE — Progress Notes (Signed)
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   Doesn't need Trigger point injections.  Area that was bothering him has stopped hurting/hurting so much.   Left low back- doing much better.  Saw Dr Mellody Drown 2 weeks ago-  Said I could do trP injections if I wanted to.   Still on PO ABX- to be on ABX for the rest of his life- been off IV ABX in March 2023. Cefradroxil 500 mg BID.    Pain- still taking MS Contin-  Not taking any other short acting pain meds.  Oxycodone stopped him up a lot, so stopped >30 days ago.   Thinks pain regimen is good the way it is- really helps- doesn't feel like can decrease it abruptly.   Still taking Tizanidine 4 mg 2x/day- takes scheduled.  Gabapentin 600 mg BID-  Not taking flexeril anymore- or Valium. Or Oxycodone or Dilaudid.   Got off wound VAC 2 weeks ago-  Getting smaller- down to < 1 inch deep.  4cm actually- doesn't have ot mess with it- covered- change gauze q 3 days- don't touch dressing underneath-  A green colored dressing, but cannot see in note what it is.   Off Flomax completely- and is peeing well Bowel back to normal.    Walking on own- no assistive devices-but still off balance sometimes- has frequent near falls- not actual falls. Probably should use cane/RW.     Plan: Will try, when next Rx is due (just wrote for it, but not due to be picked up til 12/19/21)- will try to do 15 mg in Am and 30 mg nightly for MS Contin- x 2 weeks and if tolerated, then can reduce to 15 mg 2x/day- don't try to reduce less than that.  2. Don't decrease Tizanidine right now- don't want to make multiple changes at once.    3.  Etodolac is at home- has taken for years- so  will switch up and try Diclofenac 75 mg daily prn #20- try to take no more than 3-5 x/week- can also use it with the Voltaren gel- at the same time-    4. Needs to do home exercise program- 10 minutes/day.    5. Can go to gym once wound is completely healed.    6. F/U 3 months -double visit-    7. UDS due today since still on pain meds.    I spent a total of  31  minutes on total care today- >50% coordination of care- due to discussing home exercise program- encouraging pt to make pain meds changes and educating how to do it.

## 2021-12-05 ENCOUNTER — Telehealth: Payer: Self-pay

## 2021-12-05 NOTE — Telephone Encounter (Signed)
PA for Morphine Sulfate ER sent to insurance through [facility]

## 2021-12-06 ENCOUNTER — Other Ambulatory Visit (HOSPITAL_COMMUNITY): Payer: Self-pay

## 2021-12-06 ENCOUNTER — Encounter (HOSPITAL_BASED_OUTPATIENT_CLINIC_OR_DEPARTMENT_OTHER): Payer: No Typology Code available for payment source | Admitting: Internal Medicine

## 2021-12-06 DIAGNOSIS — E11622 Type 2 diabetes mellitus with other skin ulcer: Secondary | ICD-10-CM | POA: Diagnosis not present

## 2021-12-06 DIAGNOSIS — L98424 Non-pressure chronic ulcer of back with necrosis of bone: Secondary | ICD-10-CM | POA: Diagnosis not present

## 2021-12-09 ENCOUNTER — Telehealth: Payer: Self-pay | Admitting: *Deleted

## 2021-12-09 LAB — TOXASSURE SELECT,+ANTIDEPR,UR

## 2021-12-09 NOTE — Telephone Encounter (Signed)
Urine drug screen for this encounter is consistent for prescribed medication 

## 2021-12-10 NOTE — Telephone Encounter (Signed)
Morphine approved 12/09/21-03/09/22

## 2021-12-13 ENCOUNTER — Other Ambulatory Visit (HOSPITAL_COMMUNITY): Payer: Self-pay

## 2021-12-13 ENCOUNTER — Encounter (HOSPITAL_BASED_OUTPATIENT_CLINIC_OR_DEPARTMENT_OTHER): Payer: No Typology Code available for payment source | Attending: Internal Medicine | Admitting: Internal Medicine

## 2021-12-13 DIAGNOSIS — E11622 Type 2 diabetes mellitus with other skin ulcer: Secondary | ICD-10-CM | POA: Diagnosis present

## 2021-12-13 DIAGNOSIS — L98424 Non-pressure chronic ulcer of back with necrosis of bone: Secondary | ICD-10-CM | POA: Diagnosis not present

## 2021-12-13 DIAGNOSIS — M869 Osteomyelitis, unspecified: Secondary | ICD-10-CM | POA: Diagnosis not present

## 2021-12-13 NOTE — Progress Notes (Signed)
PRAKASH, KIMBERLING ([id number]) Visit Report for 12/06/2021 Chief Complaint Document Details Patient Name: Date of Service: CAMBELL, STANEK 12/06/2021 8:30 A M Medical Record Number: [id number] Patient Account Number: [id number] Date of Birth/Sex: Treating RN: 02/07/1966 (56 y.o. [name] Primary Care Provider: [name] Other Clinician: Referring Provider: Treating Provider/Extender: [name] Weeks in Treatment: 36 Information Obtained from: Patient Chief Complaint 03/28/2021; Back wound Electronic Signature(s) Signed: 12/06/2021 11:19:55 AM By: Kalman Shan DO Entered By: Kalman Shan on 12/06/2021 10:24:24 -------------------------------------------------------------------------------- Cellular or Tissue Based Product Details Patient Name: Date of Service: [name] 12/06/2021 8:30 A M Medical Record Number: [id number] Patient Account Number: [id number] Date of Birth/Sex: Treating RN: 08/26/1965 (56 y.o. [name] Primary Care Provider: [name] Other Clinician: Referring Provider: Treating Provider/Extender: [name] in Treatment: 36 Cellular or Tissue Based Product Type Wound #1 Midline Back Applied to: Performed By: Physician Kalman Shan, DO Cellular or Tissue Based Product Type: Epicord Level of Consciousness (Pre-procedure): Awake and Alert Pre-procedure Verification/Time Out Yes - 09:10 Taken: Location: trunk / arms / legs Wound Size (sq cm): 3.52 Product Size (sq cm): 6 Waste Size (sq cm): 0 Amount of Product Applied (sq cm): 6 Instrument Used: Forceps, Scissors Lot #: [id number] Order #: 5 Expiration Date: 07/14/2026 Fenestrated: No Reconstituted: Yes Solution Type: normal saline Solution Amount: 55m Lot #: [id number] Expiration Date: 01/12/2022 Secured: Yes Secured With: Steri-Strips, adaptic Dressing Applied: No Procedural Pain: 0 Post Procedural Pain: 0 Response to  Treatment: Procedure was tolerated well Level of Consciousness (Post- Awake and Alert procedure): Post Procedure Diagnosis Same as Pre-procedure Electronic Signature(s) Signed: 12/06/2021 11:19:55 AM By: HKalman ShanDO Signed: 12/06/2021 5:57:00 PM By: DDeon PillingRN, BSN Entered By: DDeon Pillingon 12/06/2021 09:12:29 -------------------------------------------------------------------------------- Debridement Details Patient Name: Date of Service: [name] 12/06/2021 8:30 A M Medical Record Number: [id number] Account Number: [id number] of Birth/Sex: Treating RN: [birth year](56y.o. MHessie DienerPrimary Care Provider: PKathlene NovemberOther Clinician: Referring Provider: Treating Provider/Extender: HFreddi StarrWeeks in Treatment: 36 Debridement Performed for Assessment: Wound #1 Midline Back Performed By: Physician HKalman Shan DO Debridement Type: Debridement Level of Consciousness (Pre-procedure): Awake and Alert Pre-procedure Verification/Time Out Yes - 09:00 Taken: Start Time: 09:01 Pain Control: Lidocaine 5% topical ointment T Area Debrided (L x W): otal 4.4 (cm) x 0.8 (cm) = 3.52 (cm) Tissue and other material debrided: Viable, Non-Viable, Slough, Subcutaneous, Slough Level: Skin/Subcutaneous Tissue Debridement Description: Excisional Instrument: Curette Bleeding: Minimum Hemostasis Achieved: Pressure End Time: 09:09 Procedural Pain: 0 Post Procedural Pain: 0 Response to Treatment: Procedure was tolerated well Level of Consciousness (Post- Awake and Alert procedure): Post Debridement Measurements of Total Wound Length: (cm) 4.4 Width: (cm) 0.8 Depth: (cm) 0.4 Volume: (cm) 1.106 Character of Wound/Ulcer Post Debridement: Requires Further Debridement Post Procedure Diagnosis Same as Pre-procedure Electronic Signature(s) Signed: 12/06/2021 12:19:08 PM By: HKalman ShanDO Signed: 12/06/2021 5:57:00 PM By: DDeon PillingRN, BSN Entered By: DDeon Pillingon 12/06/2021 12:18:27 -------------------------------------------------------------------------------- HPI Details Patient Name: Date of Service: [name] 12/06/2021 8:30 A M Medical Record Number: [id number] Account Number: [id number] of Birth/Sex: Treating RN: 305-25-1967(56y.o. MErie NoePrimary Care Provider: PKathlene NovemberOther Clinician: Referring Provider: Treating Provider/Extender: HFreddi StarrWeeks in Treatment: 36 History of Present Illness HPI Description: Admission 03/28/2021 Mr. CCordero Suretteis a 56year old male with a past medical history of  controlled type 2 diabetes on oral agents, obesity and OSA that presents to the clinic for a back wound. On 01/11/2021 patient had a laminectomy with PLIF of L1-S1 by Dr. Venetia Constable because of lumbar stenosis and radiculopathy. He subsequently developed bacteremia. He had CT imaging on 10/13 of the lumbar spine that showed fluid collection in the soft tissue of the posterior L1 and S1 and was taken to the OR for washout on 10/14. 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 He received 4 weeks of IV antibiotics by infectious disease. After his completion of 4 weeks of IV antibiotics he was continued for an additional 4 weeks of IV cefazolin with a stop date of 12/29. He has been evaluated by plastic surgery and no plans for surgical intervention at this time. Wife is present and reports he has been on the wound VAC for the past 8 weeks with improvement in wound healing. He currently denies systemic signs of infection. 12/22; patient presents for follow-up. He reports no issues since last clinic visit. He denies signs of infection. He has been tolerating the wound VAC well. 12/30; patient presents for follow-up. He reports no issues and has no complaints today. He has been tolerating the wound VAC well. 1/9; patient presents for  follow-up. He has no issues or complaints today. He states he feels well. He has had no problems with the wound VAC. 1/16; patient presents for follow-up. He continues to use the wound VAC with no issues. He denies signs of infection. 1/23; patient presents for follow-up. He has been switched from IV cefazolin to oral cefadroxil by infectious disease. He has no issues or complaints today. He denies signs of infection. He continues to tolerate the wound VAC well. 1/30; patient presents for follow-up. He continues to tolerate the wound VAC well. 2/6; patient presents for follow-up. He has no issues or complaints today. He continues to tolerate the wound VAC well. He denies signs of infection. 2/13; patient presents for follow-up. He continues to do well with the wound VAC. He denies any issues. 2/27; patient presents for follow-up. He continues to use the wound VAC without any issues. He denies signs of infection. 3/20; patient presents for follow-up. He has no issues or complaints today. He continues to use the wound VAC. 4/3; patient presents for follow-up. He continues to use the wound VAC without issues. He denies signs of infection. 4/17; 2-week follow-up. He continues to do well. His measurements are improved. Initially a surgical wound complicated by infection. 5/1; patient presents for follow-up. He has no issues or complaints today. He continues to tolerate the wound VAC well. He denies signs of infection. 5/15; patient presents for follow-up. He has noted more maceration to the periwound. He has been using the wound VAC without issues. He currently denies signs of infection. 5/30; patient presents for follow-up. He has been tolerating the wound VAC well over the past 2 weeks. He no longer has maceration to the periwound. He has no issues or complaints today. 6/8; this patient with a postsurgical wound that was complicated by postop infection. He has been using silver collagen under wound VAC  and gradually doing well improvement in dimensions especially the tunnel at 12:00 6/22; patient presents for follow-up. We have been using silver collagen under the wound VAC. Patient has no issues or questions today. 7/6; patient presents for follow-up. Patient continues to use collagen under the wound VAC with no issues. 7/20; patient presents for follow-up.  He has been using collagen under the wound VAC with no issues. He has been approved for Epicord. This was discussed with the patient and he is in agreement to having this placed at next clinic visit. 7/27; patient presents for follow-up. He has been tolerating the wound VAC well with collagen underneath. He has been approved for epi cord and we have this to place in office today. 8/4; patient presents for follow-up. He had Epicort placed at last clinic visit. We held off on the wound VAC. He tolerated this well. He has no issues or complaints today. 8/11; patient presents for follow-up. Epicord was placed at last clinic visit. He had the wound VAC on for the past week. He states that no drainage was suctioned into the canister. He has slight maceration to the periwound. 8/18; patient presents for follow-up. Epicord #3 was placed in standard fashion last week. The wound VAC was held. He has no issues or complaints today. 8/25; Patient presents for follow-up. Epicord #4 was placed in standard fashion last week. The wound VAC was held again. He has no issues or complaints today. There has been improvement in wound healing. Electronic Signature(s) Signed: 12/06/2021 11:19:55 AM By: Kalman Shan DO Entered By: Kalman Shan on 12/06/2021 10:26:01 -------------------------------------------------------------------------------- Physical Exam Details Patient Name: Date of Service: [name] 12/06/2021 8:30 A M Medical Record Number: [id number] Patient Account Number: [id number] Date of Birth/Sex: Treating RN: 1965-09-13 (56 y.o. [name] Primary Care Provider: [name] Other Clinician: Referring Provider: Treating Provider/Extender: [name] Weeks in Treatment: 36 Constitutional respirations regular, non-labored and within target range for patient.. Cardiovascular 2+ dorsalis pedis/posterior tibialis pulses. Psychiatric pleasant and cooperative. Notes T the lumbar spine over an incision site there is a dehisced wound with tunneling at the 12 o'clock position. Granulation tissue and nonviable tissue present. No o surrounding signs of infection. Electronic Signature(s) Signed: 12/06/2021 11:19:55 AM By: Kalman Shan DO Entered By: Kalman Shan on 12/06/2021 10:26:26 -------------------------------------------------------------------------------- Physician Orders Details Patient Name: Date of Service: [name] 12/06/2021 8:30 A M Medical Record Number: [id number] Patient Account Number: [id number] Date of Birth/Sex: Treating RN: 1966-01-14 (56 y.o. 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 - Friday 12/13/21 @ 1000 w/ Dr. Heber [place] and Allayne Butcher, Rm # 9 Return A ppointment in 2 weeks. - Friday 12/20/21 @ 1000 w/ Dr. Heber [place] and Allayne Butcher, Rm # 9 Return A Anesthetic (In clinic) Topical Lidocaine 5% applied to wound bed Cellular or Tissue Based Products Cellular or Tissue Based Product Type: - Epicord #3 applied 11/22/21 Epicord #4 11/29/21 Epicord #5 12/06/2021 daptic or Mepitel. (DO NOT REMOVE). - leave Cellular or Tissue Based Product applied to wound bed, secured with  steri-strips, cover with A the adaptic and steri-strips in place. Bathing/ Shower/ Hygiene May shower with protection but do not get wound dressing(s) wet. Negative Presssure Wound Therapy Discontinue wound vac - Patient to call [facility] to pick up wound vac. Additional Orders / Instructions Follow Nutritious Diet - Continue to monitor blood sugars daily Wound Treatment Wound #1 - Back Wound Laterality: Midline Peri-Wound Care: Skin Prep Every Other Day/30 Days Discharge Instructions: Use skin prep as  directed Prim Dressing: Epicord Every Other Day/30 Days ary Discharge Instructions: applied by provider directly to wound bed. Secondary Dressing: ADAPTIC TOUCH 3x4.25 in Every Other Day/30 Days Discharge Instructions: Applied over primary dressing secured with steri-strips as directed. Secondary Dressing: Woven Gauze Sponge, Non-Sterile 4x4 in Every Other Day/30 Days Discharge Instructions: Apply over primary dressing as directed. Secondary Dressing: Zetuvit Plus Silicone Border Dressing 7x7(in/in) (Generic) Every Other Day/30 Days Discharge Instructions: Apply silicone border over primary dressing as directed. Patient Medications llergies: penicillin, Cymbalta A Notifications Medication Indication Start End 12/06/2021 lidocaine DOSE topical 5 % gel - gel topical applied only for debridement in clinic. Electronic Signature(s) Signed: 12/06/2021 11:19:55 AM By: Kalman Shan DO Entered By: Kalman Shan on 12/06/2021 10:26:34 -------------------------------------------------------------------------------- Problem List Details Patient Name: Date of Service: [name] 12/06/2021 8:30 A M Medical Record Number: [id number] Patient Account Number: [id number] Date of Birth/Sex: Treating RN: 01-24-1966 (56 y.o. 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 12/06/2021 8:30 A M Medical Record Number: [id number] Patient Account Number: [id number] Date of Birth/Sex: Treating RN: Aug 05, 1965 (56 y.o. [name] Primary Care Provider: [name] Other Clinician: Referring Provider: Treating Provider/Extender: [name] Weeks in Treatment: 36 Subjective Chief Complaint Information obtained from Patient 03/28/2021; Back wound History of Present Illness (HPI) Admission 03/28/2021 Mr. Koben Daman is a 56 year old male with a past medical history of controlled type 2 diabetes on oral agents, obesity and OSA that presents to the clinic for a back wound. On 01/11/2021 patient had a laminectomy with PLIF of L1-S1 by Dr. Venetia Constable because of lumbar stenosis and radiculopathy. He subsequently developed bacteremia. He had CT imaging on 10/13 of the lumbar spine that showed fluid collection in the soft tissue of the posterior L1 and S1 and was taken to the OR for washout on 10/14. 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 He received 4 weeks of IV antibiotics by infectious disease. After his completion of 4 weeks of IV antibiotics he was continued for an additional 4 weeks of IV cefazolin with a stop date of 12/29. He has been evaluated by plastic surgery and no plans for surgical intervention at this time.  Wife is present and reports he has been on the wound VAC for the past 8 weeks with improvement in wound healing. He currently denies systemic signs of infection. 12/22; patient presents for follow-up. He reports no issues since last clinic visit. He denies signs of infection. He has been tolerating the wound VAC well. 12/30; patient presents for follow-up. He reports no issues and has no complaints today. He has been tolerating the wound VAC well. 1/9; patient presents for follow-up. He has no issues or complaints today. He states he feels well. He has had no problems with the wound VAC. 1/16; patient presents for follow-up. He continues to use the wound VAC with no issues. He denies signs of infection. 1/23; patient presents for follow-up. He has  been switched from IV cefazolin to oral cefadroxil by infectious disease. He has no issues or complaints today. He denies signs of infection. He continues to tolerate the wound VAC well. 1/30; patient presents for follow-up. He continues to tolerate the wound VAC well. 2/6; patient presents for follow-up. He has no issues or complaints today. He continues to tolerate the wound VAC well. He denies signs of infection. 2/13; patient presents for follow-up. He continues to do well with the wound VAC. He denies any issues. 2/27; patient presents for follow-up. He continues to use the wound VAC without any issues. He denies signs of infection. 3/20; patient presents for follow-up. He has no issues or complaints today. He continues to use the wound VAC. 4/3; patient presents for follow-up. He continues to use the wound VAC without issues. He denies signs of infection. 4/17; 2-week follow-up. He continues to do well. His measurements are improved. Initially a surgical wound complicated by infection. 5/1; patient presents for follow-up. He has no issues or complaints today. He continues to tolerate the wound VAC well. He denies signs of infection. 5/15; patient  presents for follow-up. He has noted more maceration to the periwound. He has been using the wound VAC without issues. He currently denies signs of infection. 5/30; patient presents for follow-up. He has been tolerating the wound VAC well over the past 2 weeks. He no longer has maceration to the periwound. He has no issues or complaints today. 6/8; this patient with a postsurgical wound that was complicated by postop infection. He has been using silver collagen under wound VAC and gradually doing well improvement in dimensions especially the tunnel at 12:00 6/22; patient presents for follow-up. We have been using silver collagen under the wound VAC. Patient has no issues or questions today. 7/6; patient presents for follow-up. Patient continues to use collagen under the wound VAC with no issues. 7/20; patient presents for follow-up. He has been using collagen under the wound VAC with no issues. He has been approved for Epicord. This was discussed with the patient and he is in agreement to having this placed at next clinic visit. 7/27; patient presents for follow-up. He has been tolerating the wound VAC well with collagen underneath. He has been approved for epi cord and we have this to place in office today. 8/4; patient presents for follow-up. He had Epicort placed at last clinic visit. We held off on the wound VAC. He tolerated this well. He has no issues or complaints today. 8/11; patient presents for follow-up. Epicord was placed at last clinic visit. He had the wound VAC on for the past week. He states that no drainage was suctioned into the canister. He has slight maceration to the periwound. 8/18; patient presents for follow-up. Epicord #3 was placed in standard fashion last week. The wound VAC was held. He has no issues or complaints today. 8/25; Patient presents for follow-up. Epicord #4 was placed in standard fashion last week. The wound VAC was held again. He has no issues or  complaints today. There has been improvement in wound healing. Patient History Information obtained from Patient. Family History Diabetes - Mother, Stroke - Siblings, No family history of Cancer, Heart Disease, Hereditary Spherocytosis, Hypertension, Kidney Disease, Lung Disease, Seizures, Thyroid Problems, Tuberculosis. Social History Never smoker, Marital Status - Married, Alcohol Use - Rarely, Drug Use - No History, Caffeine Use - Rarely. Medical History Cardiovascular Patient has history of Hypertension Endocrine Patient has history of Type II Diabetes Musculoskeletal Patient  has history of Osteomyelitis Medical A Surgical History Notes nd Musculoskeletal DDD Objective Constitutional respirations regular, non-labored and within target range for patient.. Vitals Time Taken: 8:18 AM, Temperature: 98.2 F, Pulse: 114 bpm, Respiratory Rate: 20 breaths/min, Blood Pressure: 140/77 mmHg. Cardiovascular 2+ dorsalis pedis/posterior tibialis pulses. Psychiatric pleasant and cooperative. General Notes: T the lumbar spine over an incision site there is a dehisced wound with tunneling at the 12 o'clock position. Granulation tissue and nonviable o tissue present. No surrounding signs of infection. Integumentary (Hair, Skin) Wound #1 status is Open. Original cause of wound was Surgical Injury. The date acquired was: 01/11/2021. The wound has been in treatment 36 weeks. The wound is located on the Midline Back. The wound measures 4.4cm length x 0.8cm width x 0.4cm depth; 2.765cm^2 area and 1.106cm^3 volume. There is Fat Layer (Subcutaneous Tissue) exposed. There is no undermining noted, however, there is tunneling at 12:00 with a maximum distance of 0.7cm. There is a medium amount of serosanguineous drainage noted. The wound margin is distinct with the outline attached to the wound base. There is large (67-100%) red granulation within the wound bed. There is a small (1-33%) amount of  necrotic tissue within the wound bed including Adherent Slough. 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 I debrided nonviable tissue. Epicord #5 was placed in standard fashion today. He is doing well without the wound VAC and I recommended discontinuing this. Procedures Wound #1 Pre-procedure diagnosis of Wound #1 is an Open Surgical Wound located on the Midline Back . There was a Excisional Skin/Subcutaneous Tissue Debridement with a total area of 3.52 sq cm performed by Kalman Shan, DO. With the following instrument(s): Curette to remove Viable and Non-Viable tissue/material. Material removed includes Subcutaneous Tissue and Slough and after achieving pain control using Lidocaine 5% topical ointment. A time out was conducted at 09:00, prior to the start of the procedure. A Minimum amount of bleeding was controlled with Pressure. The procedure was tolerated well with a pain level of 0 throughout and a pain level of 0 following the procedure. Post Debridement Measurements: 4.4cm length x 0.8cm width x 0.4cm depth; 1.106cm^3 volume. Character of Wound/Ulcer Post Debridement requires further debridement. Post procedure Diagnosis Wound #1: Same as Pre-Procedure Pre-procedure diagnosis of Wound #1 is an Open Surgical Wound located on the Midline Back. A skin graft procedure using a bioengineered skin substitute/cellular or tissue based product was performed by Kalman Shan, DO with the following instrument(s): Forceps and Scissors. Epicord was applied and secured with Steri-Strips and adaptic. 6 sq cm of product was utilized and 0 sq cm was wasted. Post Application, no dressing was applied. A Time Out was conducted at 09:10, prior to the start of the procedure. The procedure was  tolerated well with a pain level of 0 throughout and a pain level of 0 following the procedure. Post procedure Diagnosis Wound #1: Same as Pre-Procedure . Plan Follow-up Appointments: Return Appointment in 1 week. - Friday 12/13/21 @ 1000 w/ Dr. Heber [place] and Allayne Butcher, Rm # 9 Return Appointment in 2 weeks. - Friday 12/20/21 @ 1000 w/ Dr. Heber [place] and Allayne Butcher, Rm # 9 Anesthetic: (In clinic) Topical Lidocaine 5% applied to wound bed Cellular or Tissue Based Products: Cellular or Tissue Based Product Type: - Epicord #3 applied 11/22/21 Epicord #4 11/29/21 Epicord #5 12/06/2021 Cellular or Tissue  Based Product applied to wound bed, secured with steri-strips, cover with Adaptic or Mepitel. (DO NOT REMOVE). - leave the adaptic and steri-strips in place. Bathing/ Shower/ Hygiene: May shower with protection but do not get wound dressing(s) wet. Negative Presssure Wound Therapy: Discontinue wound vac - Patient to call [facility] to pick up wound vac. Additional Orders / Instructions: Follow Nutritious Diet - Continue to monitor blood sugars daily The following medication(s) was prescribed: lidocaine topical 5 % gel gel topical applied only for debridement in clinic. was prescribed at facility WOUND #1: - Back Wound Laterality: Midline Peri-Wound Care: Skin Prep Every Other Day/30 Days Discharge Instructions: Use skin prep as directed Prim Dressing: Epicord Every Other Day/30 Days ary Discharge Instructions: applied by provider directly to wound bed. Secondary Dressing: ADAPTIC TOUCH 3x4.25 in Every Other Day/30 Days Discharge Instructions: Applied over primary dressing secured with steri-strips as directed. Secondary Dressing: Woven Gauze Sponge, Non-Sterile 4x4 in Every Other Day/30 Days Discharge Instructions: Apply over primary dressing as directed. Secondary Dressing: Zetuvit Plus Silicone Border Dressing 7x7(in/in) (Generic) Every Other Day/30 Days Discharge Instructions: Apply silicone border over  primary dressing as directed. 1. In office sharp debridement 2. Epicord #5 placed in standard fashion 3. Follow-up in 1 week Electronic Signature(s) Signed: 12/06/2021 12:19:08 PM By: Kalman Shan DO Signed: 12/06/2021 5:57:00 PM By: Deon Pilling RN, BSN Previous Signature: 12/06/2021 11:19:55 AM Version By: Kalman Shan DO Entered By: Deon Pilling on 12/06/2021 12:18:44 -------------------------------------------------------------------------------- HxROS Details Patient Name: Date of Service: [name] 12/06/2021 8:30 A M Medical Record Number: [id number] Patient Account Number: [id number] Date of Birth/Sex: Treating RN: [birth year] (56 y.o. [name], [name] Primary Care Provider: [name] Other Clinician: Referring Provider: Treating Provider/Extender: [name] Weeks in Treatment: 36 Information Obtained From Patient Cardiovascular Medical History: Positive for: Hypertension Endocrine Medical History: Positive for: Type II Diabetes Time with diabetes: Since mid 90's Treated with: Oral agents Blood sugar tested every day: Yes Tested : 2x day Musculoskeletal Medical History: Positive for: Osteomyelitis Past Medical History Notes: DDD Immunizations Pneumococcal Vaccine: Received Pneumococcal Vaccination: Yes Received Pneumococcal Vaccination On or After 60th Birthday: No Implantable Devices Yes Family and Social History Cancer: No; Diabetes: Yes - Mother; Heart Disease: No; Hereditary Spherocytosis: No; Hypertension: No; Kidney Disease: No; Lung Disease: No; Seizures: No; Stroke: Yes - Siblings; Thyroid Problems: No; Tuberculosis: No; Never smoker; Marital Status - Married; Alcohol Use: Rarely; Drug Use: No History; Caffeine Use: Rarely; Financial Concerns: No; Food, Clothing or Shelter Needs: No; Support System Lacking: No; Transportation Concerns: No Electronic Signature(s) Signed: 12/06/2021 11:19:55 AM By: Kalman Shan  DO Signed: 12/13/2021 1:11:09 PM By: [name] RN Entered By: Kalman Shan on 12/06/2021 10:26:06 -------------------------------------------------------------------------------- SuperBill Details Patient Name: Date of Service: [name] 12/06/2021 Medical Record Number: [id number] Patient Account Number: [id number] Date of Birth/Sex: Treating RN: 12-Jul-1965 (56 y.o. 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

## 2021-12-13 NOTE — Progress Notes (Signed)
[name], [name] ([id number]) Visit Report for 12/06/2021 Arrival Information Details Patient Name: Date of Service: [name], [name] 12/06/2021 8:30 A M Medical Record Number: [id number] Patient Account Number: [id number] Date of Birth/Sex: Treating RN: 24-Apr-1965 (55 y.o. [name] Primary Care Leoncio Hansen: [name] Other Clinician: Referring Carlette Palmatier: Treating Griffin Gerrard/Extender: [name] in Treatment: 36 Visit Information History Since Last Visit Added or deleted any medications: No Patient Arrived: Ambulatory Any new allergies or adverse reactions: No Arrival Time: 08:17 Had a fall or experienced change in No Accompanied By: self activities of daily living that may affect Transfer Assistance: None risk of falls: Patient Identification Verified: Yes Signs or symptoms of abuse/neglect since last visito No Secondary Verification Process Completed: Yes Hospitalized since last visit: No Patient Requires Transmission-Based Precautions: No Implantable device outside of the clinic excluding No Patient Has Alerts: Yes cellular tissue based products placed in the center Patient Alerts: Patient on Blood Thinner since last visit: No BP Right Arm-PICC Has Dressing in Place as Prescribed: Yes Pain Present Now: No Electronic Signature(s) Signed: 12/06/2021 5:57:00 PM By: Deon Pilling RN, BSN Entered By: Deon Pilling on 12/06/2021 08:17:23 -------------------------------------------------------------------------------- Encounter Discharge Information Details Patient Name: Date of Service: Christian Grout. 12/06/2021 8:30 A M Medical Record Number: [id number] Patient Account Number: [id number] Date of Birth/Sex: Treating RN: 07/01/1965 (56 y.o. [name] Primary Care Telsa Dillavou: [name] Other Clinician: Referring Poetry Cerro: Treating Bexton Haak/Extender: [name] Weeks in Treatment: 36 Encounter Discharge Information Items Post Procedure  Vitals Discharge Condition: Stable Temperature (F): 98.1 Ambulatory Status: Ambulatory Pulse (bpm): 114 Discharge Destination: Home Respiratory Rate (breaths/min): 20 Transportation: Private Auto Blood Pressure (mmHg): 140/77 Accompanied By: self Schedule Follow-up Appointment: Yes Clinical Summary of Care: Electronic Signature(s) Signed: 12/06/2021 5:57:00 PM By: Deon Pilling RN, BSN Entered By: Deon Pilling on 12/06/2021 09:13:31 -------------------------------------------------------------------------------- Lower Extremity Assessment Details Patient Name: Date of Service: Christian Brown E. 12/06/2021 8:30 A M Medical Record Number: [id number] Patient Account Number: [id number] Date of Birth/Sex: Treating RN: [birth year] (56 y.o. [name] Primary Care Doniesha Landau: [name] Other Clinician: Referring Simren Popson: Treating Suhayb Anzalone/Extender: [name] Weeks in Treatment: 36 Electronic Signature(s) Signed: 12/06/2021 5:57:00 PM By: Deon Pilling RN, BSN Entered By: Deon Pilling on 12/06/2021 08:18:27 -------------------------------------------------------------------------------- Multi Wound Chart Details Patient Name: Date of Service: Christian Brown E. 12/06/2021 8:30 A M Medical Record Number: [id number] Patient Account Number: [id number] Date of Birth/Sex: Treating RN: 07/09/1965 (56 y.o. [name], [place] Primary Care Shammara Jarrett: [name] Other Clinician: Referring Deborra Phegley: Treating Jamyson Jirak/Extender: [name] Weeks in Treatment: 36 Vital Signs Height(in): Pulse(bpm): 114 Weight(lbs): Blood Pressure(mmHg): 140/77 Body Mass Index(BMI): Temperature(F): 98.2 Respiratory Rate(breaths/min): 20 Photos: [N/A:N/A] Midline Back N/A N/A Wound Location: Surgical Injury N/A N/A Wounding Event: Open Surgical Wound N/A N/A Primary Etiology: Hypertension, Type II Diabetes, N/A N/A Comorbid History: Osteomyelitis 01/11/2021 N/A  N/A Date Acquired: 29 N/A N/A Weeks of Treatment: Open N/A N/A Wound Status: No N/A N/A Wound Recurrence: 4.4x0.8x0.4 N/A N/A Measurements L x W x D (cm) 2.765 N/A N/A A (cm) : rea 1.106 N/A N/A Volume (cm) : 96.30% N/A N/A % Reduction in A rea: 99.40% N/A N/A % Reduction in Volume: 12 Position 1 (o'clock): 0.7 Maximum Distance 1 (cm): Yes N/A N/A Tunneling: Full Thickness With Exposed Support N/A N/A Classification: Structures Medium N/A N/A Exudate Amount: Serosanguineous N/A N/A Exudate Type: red, brown N/A N/A Exudate Color: Distinct, outline attached N/A N/A  Wound Margin: Large (67-100%) N/A N/A Granulation Amount: Red N/A N/A Granulation Quality: Small (1-33%) N/A N/A Necrotic Amount: Fat Layer (Subcutaneous Tissue): Yes N/A N/A Exposed Structures: Fascia: No Tendon: No Muscle: No Joint: No Bone: No Large (67-100%) N/A N/A Epithelialization: Cellular or Tissue Based Product N/A N/A Procedures Performed: Treatment Notes Wound #1 (Back) Wound Laterality: Midline Cleanser Peri-Wound Care Skin Prep Discharge Instruction: Use skin prep as directed Topical Primary Dressing Epicord Discharge Instruction: applied by Mikaia Janvier directly to wound bed. Secondary Dressing ADAPTIC TOUCH 3x4.25 in Discharge Instruction: Applied over primary dressing secured with steri-strips as directed. Woven Gauze Sponge, Non-Sterile 4x4 in Discharge Instruction: Apply over primary dressing as directed. Zetuvit Plus Silicone Border Dressing 7x7(in/in) Discharge Instruction: Apply silicone border over primary dressing as directed. Secured With Compression Wrap Compression Stockings Add-Ons Electronic Signature(s) Signed: 12/06/2021 11:19:55 AM By: Kalman Shan DO Signed: 12/13/2021 1:11:09 PM By: [name] RN Entered By: Kalman Shan on 12/06/2021  10:24:19 -------------------------------------------------------------------------------- Multi-Disciplinary Care Plan Details Patient Name: Date of Service: Christian Brown E. 12/06/2021 8:30 A M Medical Record Number: [id number] Patient Account Number: [id number] Date of Birth/Sex: Treating RN: [birth year] (56 y.o. [name] Primary Care Shatori Bertucci: [name] Other Clinician: Referring Gittel Mccamish: Treating Maryalyce Sanjuan/Extender: [name] Weeks in Treatment: [place] reviewed with physician Active Inactive Wound/Skin Impairment Nursing Diagnoses: Impaired tissue integrity Goals: Patient/caregiver will verbalize understanding of skin care regimen Date Initiated: 03/28/2021 Target Resolution Date: 12/27/2021 Goal Status: Active Ulcer/skin breakdown will have a volume reduction of 30% by week 4 Date Initiated: 03/28/2021 Date Inactivated: 05/27/2021 Target Resolution Date: 05/31/2021 Goal Status: Met Interventions: Assess patient/caregiver ability to obtain necessary supplies Assess patient/caregiver ability to perform ulcer/skin care regimen upon admission and as needed Assess ulceration(s) every visit Provide education on ulcer and skin care Treatment Activities: Topical wound management initiated : 03/28/2021 Notes: Electronic Signature(s) Signed: 12/06/2021 5:57:00 PM By: Deon Pilling RN, BSN Entered By: Deon Pilling on 12/06/2021 08:23:35 -------------------------------------------------------------------------------- Pain Assessment Details Patient Name: Date of Service: Christian Grout. 12/06/2021 8:30 A M Medical Record Number: [id number] Patient Account Number: [id number] Date of Birth/Sex: Treating RN: 24-Apr-1965 (55 y.o. [name] Primary Care Pamila Mendibles: [name] Other Clinician: Referring Timmy Cleverly: Treating Taegen Delker/Extender: [name] Weeks in Treatment: 36 Active Problems Location of Pain Severity  and Description of Pain Patient Has Paino No Site Locations Rate the pain. Current Pain Level: 0 Pain Management and Medication Current Pain Management: Medication: No Cold Application: No Rest: No Massage: No Activity: No T.MurphyN.S.: No Heat Application: No Leg drop or elevation: No Is the Current Pain Management Adequate: Adequate How does your wound impact your activities of daily livingo Sleep: No Bathing: No Appetite: No Relationship With Others: No Bladder Continence: No Emotions: No Bowel Continence: No Work: No Toileting: No Drive: No Dressing: No Hobbies: No Engineer, maintenance) Signed: 12/06/2021 5:57:00 PM By: Deon Pilling RN, BSN Entered By: Deon Pilling on 12/06/2021 08:18:22 -------------------------------------------------------------------------------- Patient/Caregiver Education Details Patient Name: Date of Service: Christian Grout 8/25/2023andnbsp8:30 A M Medical Record Number: [id number] Patient Account Number: [id number] Date of Birth/Gender: Treating RN: [birth year] (56 y.o. [name] Primary Care Physician: [name] Other Clinician: Referring Physician: Treating Physician/Extender: [name] in Treatment: 62 Education Assessment Education Provided To: Patient Education Topics Provided Wound/Skin Impairment: Handouts: Skin Care Do's and Dont's Methods: Explain/Verbal Responses: Reinforcements needed Electronic Signature(s) Signed: 12/06/2021 5:57:00 PM By: Deon Pilling RN, BSN Entered By: Deon Pilling  on 12/06/2021 08:23:45 -------------------------------------------------------------------------------- Wound Assessment Details Patient Name: Date of Service: [name], [name] 12/06/2021 8:30 A M Medical Record Number: [id number] Patient Account Number: [id number] Date of Birth/Sex: Treating RN: 03-21-1966 (56 y.o. [name] Primary Care Shaquanta Harkless: [name] Other Clinician: Referring  Tyreonna Czaplicki: Treating Cobey Raineri/Extender: [name] Weeks in Treatment: 36 Wound Status Wound Number: 1 Primary Etiology: Open Surgical Wound Wound Location: Midline Back Wound Status: Open Wounding Event: Surgical Injury Comorbid History: Hypertension, Type II Diabetes, Osteomyelitis Date Acquired: 01/11/2021 Weeks Of Treatment: 36 Clustered Wound: No Photos Wound Measurements Length: (cm) 4.4 Width: (cm) 0.8 Depth: (cm) 0.4 Area: (cm) 2.765 Volume: (cm) 1.106 % Reduction in Area: 96.3% % Reduction in Volume: 99.4% Epithelialization: Large (67-100%) Tunneling: Yes Position (o'clock): 12 Maximum Distance: (cm) 0.7 Undermining: No Wound Description Classification: Full Thickness With Exposed Support Structures Wound Margin: Distinct, outline attached Exudate Amount: Medium Exudate Type: Serosanguineous Exudate Color: red, brown Foul Odor After Cleansing: No Slough/Fibrino Yes Wound Bed Granulation Amount: Large (67-100%) Exposed Structure Granulation Quality: Red Fascia Exposed: No Necrotic Amount: Small (1-33%) Fat Layer (Subcutaneous Tissue) Exposed: Yes Necrotic Quality: Adherent Slough Tendon Exposed: No Muscle Exposed: No Joint Exposed: No Bone Exposed: No Electronic Signature(s) Signed: 12/06/2021 5:57:00 PM By: Deon Pilling RN, BSN Entered By: Deon Pilling on 12/06/2021 08:22:47 -------------------------------------------------------------------------------- Vitals Details Patient Name: Date of Service: Christian Brown E. 12/06/2021 8:30 A M Medical Record Number: [id number] Patient Account Number: [id number] Date of Birth/Sex: Treating RN: 1965/12/17 (56 y.o. [name] Primary Care Ladeidra Borys: [name] Other Clinician: Referring Tristen Pennino: Treating Aliciana Ricciardi/Extender: [name] Weeks in Treatment: 36 Vital Signs Time Taken: 08:18 Temperature (F): 98.2 Pulse (bpm): 114 Respiratory Rate (breaths/min): 20 Blood  Pressure (mmHg): 140/77 Reference Range: 80 - 120 mg / dl Electronic Signature(s) Signed: 12/06/2021 5:57:00 PM By: Deon Pilling RN, BSN Entered By: Deon Pilling on 12/06/2021 08:21:39

## 2021-12-17 NOTE — Progress Notes (Signed)
CATALDO, COSGRIFF ([id number]) Visit Report for 12/13/2021 Chief Complaint Document Details Patient Name: Date of Service: [name], [name] 12/13/2021 10:00 A M Medical Record Number: [id number] Patient Account Number: [id number] Date of Birth/Sex: Treating RN: Apr 01, 1966 (56 y.o. [name] Primary Care Provider: [name] Other Clinician: Referring Provider: Treating Provider/Extender: [name] Weeks in Treatment: 37 Information Obtained from: Patient Chief Complaint 03/28/2021; Back wound Electronic Signature(s) Signed: 12/17/2021 12:26:30 PM By: Kalman Shan DO Entered By: Kalman Shan on 12/13/2021 12:16:31 -------------------------------------------------------------------------------- Cellular or Tissue Based Product Details Patient Name: Date of Service: [name] E. 12/13/2021 10:00 A M Medical Record Number: [id number] Patient Account Number: [id number] Date of Birth/Sex: Treating RN: Dec 24, 1965 (56 y.o. [name], [name] Primary Care Provider: [name] Other Clinician: Referring Provider: Treating Provider/Extender: [name] in Treatment: 37 Cellular or Tissue Based Product Type Wound #1 Midline Back Applied to: Performed By: Physician Kalman Shan, DO Cellular or Tissue Based Product Type: Epicord Level of Consciousness (Pre-procedure): Awake and Alert Pre-procedure Verification/Time Out Yes - 11:10 Taken: Location: trunk / arms / legs Wound Size (sq cm): 1.72 Product Size (sq cm): 6 Waste Size (sq cm): 2 Waste Reason: wound size Amount of Product Applied (sq cm): 4 Instrument Used: Forceps, Scissors Lot #: [id number] Expiration Date: 08/13/2026 Fenestrated: No Reconstituted: Yes Solution Type: normal saline Solution Amount: 68m Lot #: [id number] Expiration Date: 01/12/2022 Secured: Yes Secured With: Steri-Strips Dressing Applied: Yes Primary Dressing: Adaptic Procedural Pain: 0 Post  Procedural Pain: 0 Response to Treatment: Procedure was tolerated well Level of Consciousness (Post- Awake and Alert procedure): Post Procedure Diagnosis Same as Pre-procedure Electronic Signature(s) Signed: 12/13/2021 1:10:23 PM By: BRhae HammockRN Signed: 12/17/2021 12:26:30 PM By: HKalman ShanDO Entered By: BRhae Hammockon 12/13/2021 11:16:39 -------------------------------------------------------------------------------- Debridement Details Patient Name: Date of Service: [name] 12/13/2021 10:00 A M Medical Record Number: [id number] Account Number: [id number] of Birth/Sex: Treating RN: [birth year](56y.o. MBurnadette Pop [name] Primary Care Provider: PKathlene NovemberOther Clinician: Referring Provider: Treating Provider/Extender: HFreddi StarrWeeks in Treatment: 37 Debridement Performed for Assessment: Wound #1 Midline Back Performed By: Physician HKalman Shan DO Debridement Type: Debridement Level of Consciousness (Pre-procedure): Awake and Alert Pre-procedure Verification/Time Out Yes - 11:10 Taken: Start Time: 11:10 Pain Control: Lidocaine T Area Debrided (L x W): otal 4.3 (cm) x 0.4 (cm) = 1.72 (cm) Tissue and other material debrided: Viable, Non-Viable, Slough, Subcutaneous, Slough Level: Skin/Subcutaneous Tissue Debridement Description: Excisional Instrument: Curette Bleeding: Minimum Hemostasis Achieved: Pressure End Time: 11:10 Procedural Pain: 0 Post Procedural Pain: 0 Response to Treatment: Procedure was tolerated well Level of Consciousness (Post- Awake and Alert procedure): Post Debridement Measurements of Total Wound Length: (cm) 4.3 Width: (cm) 0.4 Depth: (cm) 0.4 Volume: (cm) 0.54 Character of Wound/Ulcer Post Debridement: Improved Post Procedure Diagnosis Same as Pre-procedure Electronic Signature(s) Signed: 12/13/2021 1:10:23 PM By: BRhae HammockRN Signed: 12/17/2021 12:26:30 PM By: HKalman ShanDO Entered By: BRhae Hammockon 12/13/2021 11:14:31 -------------------------------------------------------------------------------- HPI Details Patient Name: Date of Service: [name] 12/13/2021 10:00 A M Medical Record Number: [id number] Account Number: [id number] of Birth/Sex: Treating RN: [birth year](56y.o. MErie NoePrimary Care Provider: PKathlene NovemberOther Clinician: Referring Provider: Treating Provider/Extender: HFreddi StarrWeeks in Treatment: 37 History of Present Illness HPI Description: Admission 03/28/2021 Mr. CTrayvond Murphy a 56year old male with a past medical history of controlled type 2 diabetes  on oral agents, obesity and OSA that presents to the clinic for a back wound. On 01/11/2021 patient had a laminectomy with PLIF of L1-S1 by Dr. Venetia Constable because of lumbar stenosis and radiculopathy. He subsequently developed bacteremia. He had CT imaging on 10/13 of the lumbar spine that showed fluid collection in the soft tissue of the posterior L1 and S1 and was taken to the OR for washout on 10/14. 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 He received 4 weeks of IV antibiotics by infectious disease. After his completion of 4 weeks of IV antibiotics he was continued for an additional 4 weeks of IV cefazolin with a stop date of 12/29. He has been evaluated by plastic surgery and no plans for surgical intervention at this time. Wife is present and reports he has been on the wound VAC for the past 8 weeks with improvement in wound healing. He currently denies systemic signs of infection. 12/22; patient presents for follow-up. He reports no issues since last clinic visit. He denies signs of infection. He has been tolerating the wound VAC well. 12/30; patient presents for follow-up. He reports no issues and has no complaints today. He has been tolerating the wound VAC well. 1/9; patient presents for  follow-up. He has no issues or complaints today. He states he feels well. He has had no problems with the wound VAC. 1/16; patient presents for follow-up. He continues to use the wound VAC with no issues. He denies signs of infection. 1/23; patient presents for follow-up. He has been switched from IV cefazolin to oral cefadroxil by infectious disease. He has no issues or complaints today. He denies signs of infection. He continues to tolerate the wound VAC well. 1/30; patient presents for follow-up. He continues to tolerate the wound VAC well. 2/6; patient presents for follow-up. He has no issues or complaints today. He continues to tolerate the wound VAC well. He denies signs of infection. 2/13; patient presents for follow-up. He continues to do well with the wound VAC. He denies any issues. 2/27; patient presents for follow-up. He continues to use the wound VAC without any issues. He denies signs of infection. 3/20; patient presents for follow-up. He has no issues or complaints today. He continues to use the wound VAC. 4/3; patient presents for follow-up. He continues to use the wound VAC without issues. He denies signs of infection. 4/17; 2-week follow-up. He continues to do well. His measurements are improved. Initially a surgical wound complicated by infection. 5/1; patient presents for follow-up. He has no issues or complaints today. He continues to tolerate the wound VAC well. He denies signs of infection. 5/15; patient presents for follow-up. He has noted more maceration to the periwound. He has been using the wound VAC without issues. He currently denies signs of infection. 5/30; patient presents for follow-up. He has been tolerating the wound VAC well over the past 2 weeks. He no longer has maceration to the periwound. He has no issues or complaints today. 6/8; this patient with a postsurgical wound that was complicated by postop infection. He has been using silver collagen under wound VAC  and gradually doing well improvement in dimensions especially the tunnel at 12:00 6/22; patient presents for follow-up. We have been using silver collagen under the wound VAC. Patient has no issues or questions today. 7/6; patient presents for follow-up. Patient continues to use collagen under the wound VAC with no issues. 7/20; patient presents for follow-up. He has been using  collagen under the wound VAC with no issues. He has been approved for Epicord. This was discussed with the patient and he is in agreement to having this placed at next clinic visit. 7/27; patient presents for follow-up. He has been tolerating the wound VAC well with collagen underneath. He has been approved for epi cord and we have this to place in office today. 8/4; patient presents for follow-up. He had Epicort placed at last clinic visit. We held off on the wound VAC. He tolerated this well. He has no issues or complaints today. 8/11; patient presents for follow-up. Epicord was placed at last clinic visit. He had the wound VAC on for the past week. He states that no drainage was suctioned into the canister. He has slight maceration to the periwound. 8/18; patient presents for follow-up. Epicord #3 was placed in standard fashion last week. The wound VAC was held. He has no issues or complaints today. 8/25; Patient presents for follow-up. Epicord #4 was placed in standard fashion last week. The wound VAC was held again. He has no issues or complaints today. There has been improvement in wound healing. 9/1; patient presents for follow-up. Epicort #5 was placed in standard fashion last week. He has no issues or complaints today. Electronic Signature(s) Signed: 12/17/2021 12:26:30 PM By: Kalman Shan DO Entered By: Kalman Shan on 12/13/2021 12:17:00 -------------------------------------------------------------------------------- Physical Exam Details Patient Name: Date of Service: [name] E. 12/13/2021 10:00 A  M Medical Record Number: [id number] Patient Account Number: [id number] Date of Birth/Sex: Treating RN: [birth year] (56 y.o. [name] Primary Care Provider: [name] Other Clinician: Referring Provider: Treating Provider/Extender: [name] Weeks in Treatment: 37 Constitutional respirations regular, non-labored and within target range for patient.Marland Kitchen Psychiatric pleasant and cooperative. Notes T the lumbar spine over an incision site there is a dehisced wound with tunneling at the 12 o'clock position. Granulation tissue and nonviable tissue present. No o surrounding signs of infection. Electronic Signature(s) Signed: 12/17/2021 12:26:30 PM By: Kalman Shan DO Entered By: Kalman Shan on 12/13/2021 12:17:29 -------------------------------------------------------------------------------- Physician Orders Details Patient Name: Date of Service: [name] E. 12/13/2021 10:00 A M Medical Record Number: [id number] Patient Account Number: [id number] Date of Birth/Sex: Treating RN: Jul 05, 1965 (56 y.o. [name] Primary Care Provider: [name] Other Clinician: Referring Provider: Treating Provider/Extender: [name] Weeks in Treatment: [id number] Verbal / Phone Orders: No Diagnosis Coding Follow-up Appointments ppointment in 1 week. - Friday w/ Dr. Heber Baring and Allayne Butcher, [place] # 9 Return A Anesthetic (In clinic) Topical Lidocaine 5% applied to wound bed Cellular or Tissue Based Products Cellular or Tissue Based Product Type: - Epicord #3 applied 11/22/21 Epicord #4 11/29/21 Epicord #5 12/06/2021 Epicord #6 12/13/21 daptic or Mepitel. (DO NOT REMOVE). - leave Cellular or Tissue Based Product applied to wound bed, secured with steri-strips, cover with A the adaptic and steri-strips in place. Bathing/ Shower/ Hygiene May shower with protection but do not get wound dressing(s) wet. Negative Presssure Wound Therapy Discontinue wound vac -  Patient to call [facility] to pick up wound vac. Additional Orders / Instructions Follow Nutritious Diet - Continue to monitor blood sugars daily Wound Treatment Wound #1 - Back Wound Laterality: Midline Peri-Wound Care: Skin Prep Every Other Day/30 Days Discharge Instructions: Use skin prep as directed Prim Dressing: Epicord Every Other Day/30 Days ary Discharge Instructions: applied by provider directly to wound bed. Secondary Dressing: ADAPTIC TOUCH 3x4.25 in Every Other Day/30 Days Discharge Instructions: Applied over primary dressing  secured with steri-strips as directed. Secondary Dressing: Woven Gauze Sponge, Non-Sterile 4x4 in Every Other Day/30 Days Discharge Instructions: Apply over primary dressing as directed. Secondary Dressing: Zetuvit Plus Silicone Border Dressing 7x7(in/in) (Generic) Every Other Day/30 Days Discharge Instructions: Apply silicone border over primary dressing as directed. Electronic Signature(s) Signed: 12/17/2021 12:26:30 PM By: Kalman Shan DO Entered By: Kalman Shan on 12/13/2021 12:17:39 -------------------------------------------------------------------------------- Problem List Details Patient Name: Date of Service: [name] E. 12/13/2021 10:00 A M Medical Record Number: [id number] Patient Account Number: [id number] Date of Birth/Sex: Treating RN: 25-Apr-1965 (56 y.o. 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 12/13/2021 10:00 A M Medical Record Number: [id number] Patient Account Number: [id number] Date of Birth/Sex: Treating RN: 06/10/1965 (56 y.o. [name] Primary Care Provider: Other Clinician: [name] Referring Provider: Treating Provider/Extender: [name] Weeks in Treatment: 37 Subjective Chief Complaint Information obtained from Patient 03/28/2021; Back wound History of Present Illness (HPI) Admission 03/28/2021 Mr. Davarious Tumbleson is a 56 year old male with a past medical history of controlled type 2 diabetes on oral agents, obesity and OSA that presents to the clinic for a back wound. On 01/11/2021 patient had a laminectomy with PLIF of L1-S1 by Dr. Venetia Constable because of lumbar stenosis and radiculopathy. He subsequently developed bacteremia. He had CT imaging on 10/13 of the lumbar spine that showed fluid collection in the soft tissue of the posterior L1 and S1 and was taken to the OR for washout on 10/14. 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 He received 4 weeks of IV antibiotics by infectious disease. After his completion of 4 weeks of IV antibiotics he was continued for an additional 4 weeks of IV cefazolin with a stop date of 12/29. He has been evaluated by plastic surgery and no plans for surgical intervention at this time. Wife is present and reports he has been on the wound VAC for the past 8 weeks with improvement in wound healing. He currently denies systemic signs of infection. 12/22; patient presents for follow-up. He reports no issues since last clinic visit. He denies signs of infection. He has been tolerating the wound VAC well. 12/30; patient presents for follow-up. He reports no issues and has no complaints today. He has been  tolerating the wound VAC well. 1/9; patient presents for follow-up. He has no issues or complaints today. He states he feels well. He has had no problems with the wound VAC. 1/16; patient presents for follow-up. He continues to use the wound VAC with no issues. He denies signs of infection. 1/23; patient presents for follow-up. He has been switched from IV cefazolin to oral cefadroxil by infectious disease. He has no issues or complaints today. He denies signs of infection. He continues to tolerate the wound VAC well. 1/30; patient presents for follow-up. He continues to tolerate the wound VAC well. 2/6; patient presents for follow-up. He has no issues or complaints today. He continues to tolerate the  wound VAC well. He denies signs of infection. 2/13; patient presents for follow-up. He continues to do well with the wound VAC. He denies any issues. 2/27; patient presents for follow-up. He continues to use the wound VAC without any issues. He denies signs of infection. 3/20; patient presents for follow-up. He has no issues or complaints today. He continues to use the wound VAC. 4/3; patient presents for follow-up. He continues to use the wound VAC without issues. He denies signs of infection. 4/17; 2-week follow-up. He continues to do well. His measurements are improved. Initially a surgical wound complicated by infection. 5/1; patient presents for follow-up. He has no issues or complaints today. He continues to tolerate the wound VAC well. He denies signs of infection. 5/15; patient presents for follow-up. He has noted more maceration to the periwound. He has been using the wound VAC without issues. He currently denies signs of infection. 5/30; patient presents for follow-up. He has been tolerating the wound VAC well over the past 2 weeks. He no longer has maceration to the periwound. He has no issues or complaints today. 6/8; this patient with a postsurgical wound that was complicated by postop  infection. He has been using silver collagen under wound VAC and gradually doing well improvement in dimensions especially the tunnel at 12:00 6/22; patient presents for follow-up. We have been using silver collagen under the wound VAC. Patient has no issues or questions today. 7/6; patient presents for follow-up. Patient continues to use collagen under the wound VAC with no issues. 7/20; patient presents for follow-up. He has been using collagen under the wound VAC with no issues. He has been approved for Epicord. This was discussed with the patient and he is in agreement to having this placed at next clinic visit. 7/27; patient presents for follow-up. He has been tolerating the wound VAC well with collagen underneath. He has been approved for epi cord and we have this to place in office today. 8/4; patient presents for follow-up. He had Epicort placed at last clinic visit. We held off on the wound VAC. He tolerated this well. He has no issues or complaints today. 8/11; patient presents for follow-up. Epicord was placed at last clinic visit. He had the wound VAC on for the past week. He states that no drainage was suctioned into the canister. He has slight maceration to the periwound. 8/18; patient presents for follow-up. Epicord #3 was placed in standard fashion last week. The wound VAC was held. He has no issues or complaints today. 8/25; Patient presents for follow-up. Epicord #4 was placed in standard fashion last week. The wound VAC was held again. He has no issues or complaints today. There has been improvement in wound healing. 9/1; patient presents for follow-up. Epicort #5 was placed in standard fashion last week. He has no issues or complaints today. Patient History Information obtained from Patient. Family History Diabetes - Mother, Stroke - Siblings, No family history of Cancer, Heart Disease, Hereditary Spherocytosis, Hypertension, Kidney Disease, Lung Disease, Seizures, Thyroid  Problems, Tuberculosis. Social History Never smoker, Marital Status - Married, Alcohol Use - Rarely, Drug Use - No History, Caffeine Use - Rarely. Medical History Cardiovascular Patient has history of Hypertension Endocrine Patient has history of Type II Diabetes Musculoskeletal Patient has history of Osteomyelitis Medical A Surgical History Notes nd Musculoskeletal DDD Objective Constitutional respirations regular, non-labored and within target range for patient.. Vitals Time Taken: 10:31 AM, Temperature: 97.8 F, Pulse: 71 bpm, Respiratory Rate: 17 breaths/min, Blood Pressure:  147/88 mmHg. Psychiatric pleasant and cooperative. General Notes: T the lumbar spine over an incision site there is a dehisced wound with tunneling at the 12 o'clock position. Granulation tissue and nonviable o tissue present. No surrounding signs of infection. Integumentary (Hair, Skin) Wound #1 status is Open. Original cause of wound was Surgical Injury. The date acquired was: 01/11/2021. The wound has been in treatment 37 weeks. The wound is located on the Midline Back. The wound measures 4.3cm length x 0.4cm width x 0.4cm depth; 1.351cm^2 area and 0.54cm^3 volume. There is Fat Layer (Subcutaneous Tissue) exposed. There is no undermining noted, however, there is tunneling at 12:00 with a maximum distance of 0.5cm. There is a medium amount of serosanguineous drainage noted. The wound margin is distinct with the outline attached to the wound base. There is large (67-100%) red granulation within the wound bed. There is a small (1-33%) amount of necrotic tissue within the wound bed including Adherent Slough. 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 I debrided nonviable tissue. Epi cord #6 was placed  in standard fashion today. Procedures Wound #1 Pre-procedure diagnosis of Wound #1 is an Open Surgical Wound located on the Midline Back . There was a Excisional Skin/Subcutaneous Tissue Debridement with a total area of 1.72 sq cm performed by Kalman Shan, DO. With the following instrument(s): Curette to remove Viable and Non-Viable tissue/material. Material removed includes Subcutaneous Tissue and Slough and after achieving pain control using Lidocaine. No specimens were taken. A time out was conducted at 11:10, prior to the start of the procedure. A Minimum amount of bleeding was controlled with Pressure. The procedure was tolerated well with a pain level of 0 throughout and a pain level of 0 following the procedure. Post Debridement Measurements: 4.3cm length x 0.4cm width x 0.4cm depth; 0.54cm^3 volume. Character of Wound/Ulcer Post Debridement is improved. Post procedure Diagnosis Wound #1: Same as Pre-Procedure Pre-procedure diagnosis of Wound #1 is an Open Surgical Wound located on the Midline Back. A skin graft procedure using a bioengineered skin substitute/cellular or tissue based product was performed by Kalman Shan, DO with the following instrument(s): Forceps and Scissors. Epicord was applied and secured with Steri-Strips. 4 sq cm of product was utilized and 2 sq cm was wasted due to wound size. Post Application, Adaptic was applied. A Time Out was conducted at 11:10, prior to the start of the procedure. The procedure was tolerated well with a pain level of 0 throughout and a pain level of 0 following the procedure. Post procedure Diagnosis Wound #1: Same as Pre-Procedure . Plan Follow-up Appointments: Return Appointment in 1 week. - Friday w/ Dr. Heber  Chapel and Allayne Butcher, Rm # 9 Anesthetic: (In clinic) Topical Lidocaine 5% applied to wound bed Cellular or Tissue Based Products: Cellular or Tissue Based Product Type: - Epicord #3 applied 11/22/21 Epicord #4 11/29/21 Epicord  #5 12/06/2021 Epicord #6 12/13/21 Cellular or Tissue Based Product applied to wound bed, secured with steri-strips, cover with Adaptic or Mepitel. (DO NOT REMOVE). - leave the adaptic and steri-strips in place. Bathing/ Shower/ Hygiene: May shower with protection but do not get wound dressing(s) wet. Negative Presssure Wound Therapy: Discontinue wound vac - Patient to call [facility] to pick up wound vac. Additional Orders / Instructions: Follow Nutritious Diet - Continue to monitor blood sugars daily WOUND #1: - Back Wound  Laterality: Midline Peri-Wound Care: Skin Prep Every Other Day/30 Days Discharge Instructions: Use skin prep as directed Prim Dressing: Epicord Every Other Day/30 Days ary Discharge Instructions: applied by provider directly to wound bed. Secondary Dressing: ADAPTIC TOUCH 3x4.25 in Every Other Day/30 Days Discharge Instructions: Applied over primary dressing secured with steri-strips as directed. Secondary Dressing: Woven Gauze Sponge, Non-Sterile 4x4 in Every Other Day/30 Days Discharge Instructions: Apply over primary dressing as directed. Secondary Dressing: Zetuvit Plus Silicone Border Dressing 7x7(in/in) (Generic) Every Other Day/30 Days Discharge Instructions: Apply silicone border over primary dressing as directed. 1. In office sharp debridement 2. Epicord #6 was placed in standard fashion today 3. Follow-up in 1 week Electronic Signature(s) Signed: 12/17/2021 12:26:30 PM By: Kalman Shan DO Entered By: Kalman Shan on 12/13/2021 12:19:54 -------------------------------------------------------------------------------- HxROS Details Patient Name: Date of Service: [name] E. 12/13/2021 10:00 A M Medical Record Number: [id number] Patient Account Number: [id number] Date of Birth/Sex: Treating RN: [birth year] (56 y.o. [name], [place] Primary Care Provider: [name] Other Clinician: Referring Provider: Treating Provider/Extender: [name] Weeks in Treatment: 37 Information Obtained From Patient Cardiovascular Medical History: Positive for: Hypertension Endocrine Medical History: Positive for: Type II Diabetes Time with diabetes: Since mid 90's Treated with: Oral agents Blood sugar tested every day: Yes Tested : 2x day Musculoskeletal Medical History: Positive for: Osteomyelitis Past Medical History Notes: DDD Immunizations Pneumococcal Vaccine: Received Pneumococcal Vaccination: Yes Received Pneumococcal Vaccination On or After 60th Birthday: No Implantable Devices Yes Family and Social History Cancer: No; Diabetes: Yes - Mother; Heart Disease: No; Hereditary Spherocytosis: No; Hypertension: No; Kidney Disease: No; Lung Disease: No; Seizures: No; Stroke: Yes - Siblings; Thyroid Problems: No; Tuberculosis: No; Never smoker; Marital Status - Married; Alcohol Use: Rarely; Drug Use: No History; Caffeine Use: Rarely; Financial Concerns: No; Food, Clothing or Shelter Needs: No; Support System Lacking: No; Transportation Concerns: No Electronic Signature(s) Signed: 12/13/2021 1:10:23 PM By: [name] RN Signed: 12/17/2021 12:26:30 PM By: Kalman Shan DO Entered By: Kalman Shan on 12/13/2021 12:17:06 -------------------------------------------------------------------------------- SuperBill Details Patient Name: Date of Service: [name] 12/13/2021 Medical Record Number: [id number] Patient Account Number: [id number] Date of Birth/Sex: Treating RN: 02-Apr-1966 (56 y.o. 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

## 2021-12-17 NOTE — Progress Notes (Signed)
TERREON, EKHOLM ([id number]) Visit Report for 12/13/2021 Arrival Information Details Patient Name: Date of Service: [name], [name] 12/13/2021 10:00 A M Medical Record Number: [id number] Patient Account Number: [id number] Date of Birth/Sex: Treating RN: 1966-02-22 (56 y.o. [name], [name] Primary Care Wen Merced: [name] Other Clinician: Referring Efosa Treichler: Treating Aloise Copus/Extender: [name] in Treatment: 1 Visit Information History Since Last Visit Added or deleted any medications: No Patient Arrived: Ambulatory Any new allergies or adverse reactions: No Arrival Time: 10:31 Had a fall or experienced change in No Accompanied By: self activities of daily living that may affect Transfer Assistance: None risk of falls: Patient Identification Verified: Yes Signs or symptoms of abuse/neglect since last visito No Secondary Verification Process Completed: Yes Hospitalized since last visit: No Patient Requires Transmission-Based Precautions: No Implantable device outside of the clinic excluding No Patient Has Alerts: Yes cellular tissue based products placed in the center Patient Alerts: Patient on Blood Thinner since last visit: No BP Right Arm-PICC Has Dressing in Place as Prescribed: Yes Pain Present Now: No Electronic Signature(s) Signed: 12/13/2021 1:10:23 PM By: [name] RN Entered By: [name] on 12/13/2021 10:31:37 -------------------------------------------------------------------------------- Encounter Discharge Information Details Patient Name: Date of Service: Christian Brown E. 12/13/2021 10:00 A M Medical Record Number: [id number] Patient Account Number: [id number] Date of Birth/Sex: Treating RN: 02-19-1966 (56 y.o. [name], [name] Primary Care Rosetta Rupnow: [name] Other Clinician: Referring Laquilla Dault: Treating Tayte Mcwherter/Extender: [name] Weeks in Treatment: 37 Encounter Discharge Information Items Discharge  Condition: Stable Ambulatory Status: Ambulatory Discharge Destination: Home Transportation: Private Auto Accompanied By: self Schedule Follow-up Appointment: Yes Clinical Summary of Care: Patient Declined Electronic Signature(s) Signed: 12/13/2021 1:10:23 PM By: [name] RN Entered By: [name] on 12/13/2021 10:46:02 -------------------------------------------------------------------------------- Lower Extremity Assessment Details Patient Name: Date of Service: Christian Brown E. 12/13/2021 10:00 A M Medical Record Number: [id number] Patient Account Number: [id number] Date of Birth/Sex: Treating RN: 23-Feb-1966 (56 y.o. [name] Primary Care Shadie Sweatman: [name] Other Clinician: Referring Setsuko Robins: Treating Ben Habermann/Extender: [name] Weeks in Treatment: 37 Electronic Signature(s) Signed: 12/13/2021 1:10:23 PM By: [name] RN Entered By: [name] on 12/13/2021 10:32:07 -------------------------------------------------------------------------------- Multi Wound Chart Details Patient Name: Date of Service: Christian Brown E. 12/13/2021 10:00 A M Medical Record Number: [id number] Patient Account Number: [id number] Date of Birth/Sex: Treating RN: [birth year] (56 y.o. [name], [place] Primary Care Allon Costlow: [name] Other Clinician: Referring Lillah Standre: Treating Ammi Hutt/Extender: [name] Weeks in Treatment: 37 Vital Signs Height(in): Pulse(bpm): 55 Weight(lbs): Blood Pressure(mmHg): 147/88 Body Mass Index(BMI): Temperature(F): 97.8 Respiratory Rate(breaths/min): 17 Photos: [N/A:N/A] Midline Back N/A N/A Wound Location: Surgical Injury N/A N/A Wounding Event: Open Surgical Wound N/A N/A Primary Etiology: Hypertension, Type II Diabetes, N/A N/A Comorbid History: Osteomyelitis 01/11/2021 N/A N/A Date Acquired: 37 N/A N/A Weeks of Treatment: Open N/A N/A Wound Status: No N/A N/A Wound  Recurrence: 4.3x0.4x0.4 N/A N/A Measurements L x W x D (cm) 1.351 N/A N/A A (cm) : rea 0.54 N/A N/A Volume (cm) : 98.20% N/A N/A % Reduction in A rea: 99.70% N/A N/A % Reduction in Volume: 12 Position 1 (o'clock): 0.5 Maximum Distance 1 (cm): Yes N/A N/A Tunneling: Full Thickness With Exposed Support N/A N/A Classification: Structures Medium N/A N/A Exudate Amount: Serosanguineous N/A N/A Exudate Type: red, brown N/A N/A Exudate Color: Distinct, outline attached N/A N/A Wound Margin: Large (67-100%) N/A N/A Granulation Amount: Red N/A N/A Granulation Quality: Small (1-33%) N/A N/A Necrotic  Amount: Fat Layer (Subcutaneous Tissue): Yes N/A N/A Exposed Structures: Fascia: No Tendon: No Muscle: No Joint: No Bone: No Large (67-100%) N/A N/A Epithelialization: Debridement - Excisional N/A N/A Debridement: Pre-procedure Verification/Time Out 11:10 N/A N/A Taken: Lidocaine N/A N/A Pain Control: Subcutaneous, Slough N/A N/A Tissue Debrided: Skin/Subcutaneous Tissue N/A N/A Level: 1.72 N/A N/A Debridement A (sq cm): rea Curette N/A N/A Instrument: Minimum N/A N/A Bleeding: Pressure N/A N/A Hemostasis A chieved: 0 N/A N/A Procedural Pain: 0 N/A N/A Post Procedural Pain: Procedure was tolerated well N/A N/A Debridement Treatment Response: 4.3x0.4x0.4 N/A N/A Post Debridement Measurements L x W x D (cm) 0.54 N/A N/A Post Debridement Volume: (cm) Cellular or Tissue Based Product N/A N/A Procedures Performed: Debridement Treatment Notes Wound #1 (Back) Wound Laterality: Midline Cleanser Peri-Wound Care Skin Prep Discharge Instruction: Use skin prep as directed Topical Primary Dressing Epicord Discharge Instruction: applied by Roneka Gilpin directly to wound bed. Secondary Dressing ADAPTIC TOUCH 3x4.25 in Discharge Instruction: Applied over primary dressing secured with steri-strips as directed. Woven Gauze Sponge, Non-Sterile 4x4 in Discharge  Instruction: Apply over primary dressing as directed. Zetuvit Plus Silicone Border Dressing 7x7(in/in) Discharge Instruction: Apply silicone border over primary dressing as directed. Secured With Compression Wrap Compression Stockings Environmental education officer) Signed: 12/13/2021 1:10:23 PM By: [name] RN Signed: 12/17/2021 12:26:30 PM By: Kalman Shan DO Entered By: Kalman Shan on 12/13/2021 12:16:22 -------------------------------------------------------------------------------- Multi-Disciplinary Care Plan Details Patient Name: Date of Service: Christian Brown E. 12/13/2021 10:00 A M Medical Record Number: [id number] Patient Account Number: [id number] Date of Birth/Sex: Treating RN: 1965-06-01 (56 y.o. [name], [name] Primary Care Ladoris Lythgoe: [name] Other Clinician: Referring Shay Jhaveri: Treating Jaelen Gellerman/Extender: [name] Weeks in Treatment: Ker[place] reviewed with physician Active Inactive Wound/Skin Impairment Nursing Diagnoses: Impaired tissue integrity Goals: Patient/caregiver will verbalize understanding of skin care regimen Date Initiated: 03/28/2021 Target Resolution Date: 12/27/2021 Goal Status: Active Ulcer/skin breakdown will have a volume reduction of 30% by week 4 Date Initiated: 03/28/2021 Date Inactivated: 05/27/2021 Target Resolution Date: 05/31/2021 Goal Status: Met Interventions: Assess patient/caregiver ability to obtain necessary supplies Assess patient/caregiver ability to perform ulcer/skin care regimen upon admission and as needed Assess ulceration(s) every visit Provide education on ulcer and skin care Treatment Activities: Topical wound management initiated : 03/28/2021 Notes: Electronic Signature(s) Signed: 12/13/2021 1:10:23 PM By: [name] RN Entered By: [name] on 12/13/2021  10:42:24 -------------------------------------------------------------------------------- Pain Assessment Details Patient Name: Date of Service: Christian Brown E. 12/13/2021 10:00 A M Medical Record Number: [id number] Patient Account Number: [id number] Date of Birth/Sex: Treating RN: [birth year] (56 y.o. [name] Primary Care Shakea Isip: [name] Other Clinician: Referring Zayven Powe: Treating Advith Martine/Extender: [name] Weeks in Treatment: 37 Active Problems Location of Pain Severity and Description of Pain Patient Has Paino No Site Locations Pain Management and Medication Current Pain Management: Electronic Signature(s) Signed: 12/13/2021 1:10:23 PM By: [name] RN Entered By: [name] on 12/13/2021 10:32:02 -------------------------------------------------------------------------------- Patient/Caregiver Education Details Patient Name: Date of Service: [place], [name] 9/1/2023andnbsp10:00 [place] Record Number: [id number] Patient Account Number: [id number] Date of Birth/Gender: Treating RN: August 09, 1965 (56 y.o. [name] Primary Care Physician: [name] Other Clinician: Referring Physician: Treating Physician/Extender: [name] in Treatment: 37 Education Assessment Education Provided To: Patient Education Topics Provided Wound/Skin Impairment: Methods: Explain/Verbal Responses: Reinforcements needed, State content correctly Electronic Signature(s) Signed: 12/13/2021 1:10:23 PM By: [name] RN Entered By: [name] on 12/13/2021 10:42:36 -------------------------------------------------------------------------------- Wound Assessment  Details Patient Name: Date of Service: [name], DUCHEMIN 12/13/2021 10:00 A M Medical Record Number: [id number] Patient Account Number: [id number] Date of Birth/Sex: Treating RN: 1966/03/31 (56 y.o. [name], [place] Primary Care [place] Paster:  [name] Other Clinician: Referring Yaremi Stahlman: Treating Zaidy Absher/Extender: [name] Weeks in Treatment: 37 Wound Status Wound Number: 1 Primary Etiology: Open Surgical Wound Wound Location: Midline Back Wound Status: Open Wounding Event: Surgical Injury Comorbid History: Hypertension, Type II Diabetes, Osteomyelitis Date Acquired: 01/11/2021 Weeks Of Treatment: 37 Clustered Wound: No Photos Wound Measurements Length: (cm) 4.3 Width: (cm) 0.4 Depth: (cm) 0.4 Area: (cm) 1.351 Volume: (cm) 0.54 % Reduction in Area: 98.2% % Reduction in Volume: 99.7% Epithelialization: Large (67-100%) Tunneling: Yes Position (o'clock): 12 Maximum Distance: (cm) 0.5 Undermining: No Wound Description Classification: Full Thickness With Exposed Support Structures Wound Margin: Distinct, outline attached Exudate Amount: Medium Exudate Type: Serosanguineous Exudate Color: red, brown Foul Odor After Cleansing: No Slough/Fibrino Yes Wound Bed Granulation Amount: Large (67-100%) Exposed Structure Granulation Quality: Red Fascia Exposed: No Necrotic Amount: Small (1-33%) Fat Layer (Subcutaneous Tissue) Exposed: Yes Necrotic Quality: Adherent Slough Tendon Exposed: No Muscle Exposed: No Joint Exposed: No Bone Exposed: No Treatment Notes Wound #1 (Back) Wound Laterality: Midline Cleanser Peri-Wound Care Skin Prep Discharge Instruction: Use skin prep as directed Topical Primary Dressing Epicord Discharge Instruction: applied by Kimisha Eunice directly to wound bed. Secondary Dressing ADAPTIC TOUCH 3x4.25 in Discharge Instruction: Applied over primary dressing secured with steri-strips as directed. Woven Gauze Sponge, Non-Sterile 4x4 in Discharge Instruction: Apply over primary dressing as directed. Zetuvit Plus Silicone Border Dressing 7x7(in/in) Discharge Instruction: Apply silicone border over primary dressing as directed. Secured With Compression Wrap Compression  Stockings Environmental education officer) Signed: 12/13/2021 1:10:23 PM By: [name] RN Entered By: [name] on 12/13/2021 10:37:59 -------------------------------------------------------------------------------- Vitals Details Patient Name: Date of Service: Christian Brown E. 12/13/2021 10:00 A M Medical Record Number: [id number] Patient Account Number: [id number] Date of Birth/Sex: Treating RN: [birth year] (56 y.o. [name] Primary Care Liesl Simons: [name] Other Clinician: Referring Jr [name]: Treating Domnique Vantine/Extender: [name] Weeks in Treatment: 37 Vital Signs Time Taken: 10:31 Temperature (F): 97.8 Pulse (bpm): 71 Respiratory Rate (breaths/min): 17 Blood Pressure (mmHg): 147/88 Reference Range: 80 - 120 mg / dl Electronic Signature(s) Signed: 12/13/2021 1:10:23 PM By: [name] RN Entered By: [name] on 12/13/2021 10:31:57

## 2021-12-20 ENCOUNTER — Encounter (HOSPITAL_BASED_OUTPATIENT_CLINIC_OR_DEPARTMENT_OTHER): Payer: No Typology Code available for payment source | Admitting: Internal Medicine

## 2021-12-20 DIAGNOSIS — E11622 Type 2 diabetes mellitus with other skin ulcer: Secondary | ICD-10-CM | POA: Diagnosis not present

## 2021-12-20 DIAGNOSIS — L98424 Non-pressure chronic ulcer of back with necrosis of bone: Secondary | ICD-10-CM

## 2021-12-20 DIAGNOSIS — M869 Osteomyelitis, unspecified: Secondary | ICD-10-CM | POA: Diagnosis not present

## 2021-12-20 NOTE — Progress Notes (Addendum)
JANTZ, MAIN ([id number]) Visit Report for 12/20/2021 Arrival Information Details Patient Name: Date of Service: [name], [name] 12/20/2021 10:00 A M Medical Record Number: [id number] Patient Account Number: [id number] Date of Birth/Sex: Treating RN: 10/19/1965 (56 y.o. [name] Primary Care Jailene Cupit: [name] Other Clinician: Referring Madex Seals: Treating Rosamaria Donn/Extender: [name] in Treatment: 38 Visit Information History Since Last Visit Added or deleted any medications: No Patient Arrived: Ambulatory Any new allergies or adverse reactions: No Arrival Time: 10:19 Had a fall or experienced change in No Accompanied By: self activities of daily living that may affect Transfer Assistance: None risk of falls: Patient Identification Verified: Yes Signs or symptoms of abuse/neglect since last visito No Secondary Verification Process Completed: Yes Hospitalized since last visit: No Patient Requires Transmission-Based Precautions: No Implantable device outside of the clinic excluding No Patient Has Alerts: Yes cellular tissue based products placed in the center Patient Alerts: Patient on Blood Thinner since last visit: No BP Right Arm-PICC Has Dressing in Place as Prescribed: Yes Pain Present Now: No Electronic Signature(s) Signed: 12/20/2021 12:28:23 PM By: Sharyn Creamer RN, BSN Entered By: Sharyn Creamer on 12/20/2021 10:20:26 -------------------------------------------------------------------------------- Encounter Discharge Information Details Patient Name: Date of Service: Christian Brown E. 12/20/2021 10:00 A M Medical Record Number: [id number] Patient Account Number: [id number] Date of Birth/Sex: Treating RN: 1966/01/02 (56 y.o. [name] Primary Care Jahnai Slingerland: [name] Other Clinician: Referring Kolston Lacount: Treating Byrl Latin/Extender: [name] in Treatment: 38 Encounter Discharge Information Items Post Procedure  Vitals Discharge Condition: Stable Temperature (F): 97.7 Ambulatory Status: Ambulatory Pulse (bpm): 76 Discharge Destination: Home Respiratory Rate (breaths/min): 18 Transportation: Private Auto Blood Pressure (mmHg): 126/75 Accompanied By: self Schedule Follow-up Appointment: Yes Clinical Summary of Care: Patient Declined Electronic Signature(s) Signed: 12/20/2021 12:28:23 PM By: Sharyn Creamer RN, BSN Entered By: Sharyn Creamer on 12/20/2021 11:49:03 -------------------------------------------------------------------------------- Lower Extremity Assessment Details Patient Name: Date of Service: Christian Brown E. 12/20/2021 10:00 A M Medical Record Number: [id number] Patient Account Number: [id number] Date of Birth/Sex: Treating RN: April 17, 1965 (56 y.o. [name] Primary Care Kei Langhorst: [name] Other Clinician: Referring Jahziah Simonin: Treating Bannon Giammarco/Extender: [name] Weeks in Treatment: 38 Electronic Signature(s) Signed: 12/20/2021 12:28:23 PM By: Sharyn Creamer RN, BSN Entered By: Sharyn Creamer on 12/20/2021 10:21:13 -------------------------------------------------------------------------------- Multi Wound Chart Details Patient Name: Date of Service: Christian Brown E. 12/20/2021 10:00 A M Medical Record Number: [id number] Patient Account Number: [id number] Date of Birth/Sex: Treating RN: Jun 08, 1965 (56 y.o. [name] Primary Care Kyuss Hale: [name] Other Clinician: Referring Dolphus Linch: Treating Avamae Dehaan/Extender: [name] Weeks in Treatment: 38 Vital Signs Height(in): Pulse(bpm): 30 Weight(lbs): Blood Pressure(mmHg): 126/75 Body Mass Index(BMI): Temperature(F): 97.7 Respiratory Rate(breaths/min): 18 Photos: [N/A:N/A] Midline Back N/A N/A Wound Location: Surgical Injury N/A N/A Wounding Event: Open Surgical Wound N/A N/A Primary Etiology: Hypertension, Type II Diabetes, N/A N/A Comorbid  History: Osteomyelitis 01/11/2021 N/A N/A Date Acquired: 56 N/A N/A Weeks of Treatment: Open N/A N/A Wound Status: No N/A N/A Wound Recurrence: 4.8x0.9x0.4 N/A N/A Measurements L x W x D (cm) 3.393 N/A N/A A (cm) : rea 1.357 N/A N/A Volume (cm) : 95.50% N/A N/A % Reduction in A rea: 99.20% N/A N/A % Reduction in Volume: 12 Position 1 (o'clock): 0.5 Maximum Distance 1 (cm): Yes N/A N/A Tunneling: Full Thickness With Exposed Support N/A N/A Classification: Structures Medium N/A N/A Exudate Amount: Serosanguineous N/A N/A Exudate Type: red, brown N/A N/A Exudate Color: Distinct, outline attached  N/A N/A Wound Margin: Large (67-100%) N/A N/A Granulation Amount: Red N/A N/A Granulation Quality: Small (1-33%) N/A N/A Necrotic Amount: Fat Layer (Subcutaneous Tissue): Yes N/A N/A Exposed Structures: Fascia: No Tendon: No Muscle: No Joint: No Bone: No Large (67-100%) N/A N/A Epithelialization: Debridement - Excisional N/A N/A Debridement: Pre-procedure Verification/Time Out 10:35 N/A N/A Taken: Lidocaine 5% topical ointment N/A N/A Pain Control: Subcutaneous, Slough N/A N/A Tissue Debrided: Skin/Subcutaneous Tissue N/A N/A Level: 4.32 N/A N/A Debridement A (sq cm): rea Curette N/A N/A Instrument: Minimum N/A N/A Bleeding: Pressure N/A N/A Hemostasis A chieved: 0 N/A N/A Procedural Pain: 0 N/A N/A Post Procedural Pain: Procedure was tolerated well N/A N/A Debridement Treatment Response: 4.8x0.9x0.4 N/A N/A Post Debridement Measurements L x W x D (cm) 1.357 N/A N/A Post Debridement Volume: (cm) Cellular or Tissue Based Product N/A N/A Procedures Performed: Debridement Treatment Notes Wound #1 (Back) Wound Laterality: Midline Cleanser Peri-Wound Care Skin Prep Discharge Instruction: Use skin prep as directed Topical Primary Dressing Epicord Discharge Instruction: applied by Aragorn Recker directly to wound bed. Secondary  Dressing ADAPTIC TOUCH 3x4.25 in Discharge Instruction: Applied over primary dressing secured with steri-strips as directed. Woven Gauze Sponge, Non-Sterile 4x4 in Discharge Instruction: Apply over primary dressing as directed. Zetuvit Plus Silicone Border Dressing 7x7(in/in) Discharge Instruction: Apply silicone border over primary dressing as directed. Secured With Compression Wrap Compression Stockings Environmental education officer) Signed: 12/20/2021 3:02:11 PM By: Kalman Shan DO Signed: 12/26/2021 4:08:15 PM By: [name] RN Entered By: Kalman Shan on 12/20/2021 13:23:59 -------------------------------------------------------------------------------- Multi-Disciplinary Care Plan Details Patient Name: Date of Service: Christian Brown E. 12/20/2021 10:00 A M Medical Record Number: [id number] Patient Account Number: [id number] Date of Birth/Sex: Treating RN: 01-09-1966 (56 y.o. [name] Primary Care Ancil Dewan: [name] Other Clinician: Referring Sable Knoles: Treating Rishab Stoudt/Extender: [name] in Treatment: [place] reviewed with physician Active Inactive Wound/Skin Impairment Nursing Diagnoses: Impaired tissue integrity Goals: Patient/caregiver will verbalize understanding of skin care regimen Date Initiated: 03/28/2021 Target Resolution Date: 12/27/2021 Goal Status: Active Ulcer/skin breakdown will have a volume reduction of 30% by week 4 Date Initiated: 03/28/2021 Date Inactivated: 05/27/2021 Target Resolution Date: 05/31/2021 Goal Status: Met Interventions: Assess patient/caregiver ability to obtain necessary supplies Assess patient/caregiver ability to perform ulcer/skin care regimen upon admission and as needed Assess ulceration(s) every visit Provide education on ulcer and skin care Treatment Activities: Topical wound management initiated : 03/28/2021 Notes: Electronic Signature(s) Signed:  12/20/2021 12:28:23 PM By: Sharyn Creamer RN, BSN Entered By: Sharyn Creamer on 12/20/2021 10:54:09 -------------------------------------------------------------------------------- Pain Assessment Details Patient Name: Date of Service: Christian Brown E. 12/20/2021 10:00 A M Medical Record Number: [id number] Patient Account Number: [id number] Date of Birth/Sex: Treating RN: [birth year] (56 y.o. [name] Primary Care Guiseppe Flanagan: [name] Other Clinician: Referring Daurice Ovando: Treating Armanie Ullmer/Extender: [name] Weeks in Treatment: 38 Active Problems Location of Pain Severity and Description of Pain Patient Has Paino No Site Locations Pain Management and Medication Current Pain Management: Electronic Signature(s) Signed: 12/20/2021 12:28:23 PM By: Sharyn Creamer RN, BSN Entered By: Sharyn Creamer on 12/20/2021 10:21:04 -------------------------------------------------------------------------------- Patient/Caregiver Education Details Patient Name: Date of Service: [place], [name] 9/8/2023andnbsp10:00 A M Medical Record Number: [id number] Patient Account Number: [id number] Date of Birth/Gender: Treating RN: Apr 20, 1965 (56 y.o. [name] Primary Care Physician: [name] Other Clinician: Referring Physician: Treating Physician/Extender: [name] in Treatment: 59 Education Assessment Education Provided To: Patient Education Topics Provided Wound/Skin Impairment: Methods: Explain/Verbal Responses:  State content correctly Electronic Signature(s) Signed: 12/20/2021 12:28:23 PM By: Sharyn Creamer RN, BSN Entered By: Sharyn Creamer on 12/20/2021 10:26:48 -------------------------------------------------------------------------------- Wound Assessment Details Patient Name: Date of Service: Christian Brown E. 12/20/2021 10:00 A M Medical Record Number: [id number] Patient Account Number: [id number] Date of Birth/Sex: Treating  RN: 22-Jun-1965 (56 y.o. [name] Primary Care Caidyn Henricksen: [name] Other Clinician: Referring Carmeron Heady: Treating Blair Mesina/Extender: [name] Weeks in Treatment: 38 Wound Status Wound Number: 1 Primary Etiology: Open Surgical Wound Wound Location: Midline Back Wound Status: Open Wounding Event: Surgical Injury Comorbid History: Hypertension, Type II Diabetes, Osteomyelitis Date Acquired: 01/11/2021 Weeks Of Treatment: 38 Clustered Wound: No Photos Wound Measurements Length: (cm) 4.8 Width: (cm) 0.9 Depth: (cm) 0.4 Area: (cm) 3.393 Volume: (cm) 1.357 % Reduction in Area: 95.5% % Reduction in Volume: 99.2% Epithelialization: Large (67-100%) Tunneling: Yes Position (o'clock): 12 Maximum Distance: (cm) 0.5 Undermining: No Wound Description Classification: Full Thickness With Exposed Support Structures Wound Margin: Distinct, outline attached Exudate Amount: Medium Exudate Type: Serosanguineous Exudate Color: red, brown Foul Odor After Cleansing: No Slough/Fibrino Yes Wound Bed Granulation Amount: Large (67-100%) Exposed Structure Granulation Quality: Red Fascia Exposed: No Necrotic Amount: Small (1-33%) Fat Layer (Subcutaneous Tissue) Exposed: Yes Necrotic Quality: Adherent Slough Tendon Exposed: No Muscle Exposed: No Joint Exposed: No Bone Exposed: No Treatment Notes Wound #1 (Back) Wound Laterality: Midline Cleanser Peri-Wound Care Skin Prep Discharge Instruction: Use skin prep as directed Topical Primary Dressing Epicord Discharge Instruction: applied by Brysin Towery directly to wound bed. Secondary Dressing ADAPTIC TOUCH 3x4.25 in Discharge Instruction: Applied over primary dressing secured with steri-strips as directed. Woven Gauze Sponge, Non-Sterile 4x4 in Discharge Instruction: Apply over primary dressing as directed. Zetuvit Plus Silicone Border Dressing 7x7(in/in) Discharge Instruction: Apply silicone border over primary  dressing as directed. Secured With Compression Wrap Compression Stockings Environmental education officer) Signed: 12/20/2021 12:28:23 PM By: Sharyn Creamer RN, BSN Entered By: Sharyn Creamer on 12/20/2021 10:25:03 -------------------------------------------------------------------------------- Vitals Details Patient Name: Date of Service: Christian Brown E. 12/20/2021 10:00 A M Medical Record Number: [id number] Patient Account Number: [id number] Date of Birth/Sex: Treating RN: 1966/01/25 (56 y.o. [name] Primary Care Noely Kuhnle: [name] Other Clinician: Referring Mars Scheaffer: Treating Izeah Vossler/Extender: [name] Weeks in Treatment: 38 Vital Signs Time Taken: 10:16 Temperature (F): 97.7 Pulse (bpm): 76 Respiratory Rate (breaths/min): 18 Blood Pressure (mmHg): 126/75 Reference Range: 80 - 120 mg / dl Electronic Signature(s) Signed: 12/20/2021 12:28:23 PM By: Sharyn Creamer RN, BSN Entered By: Sharyn Creamer on 12/20/2021 10:20:50

## 2021-12-23 ENCOUNTER — Other Ambulatory Visit (HOSPITAL_COMMUNITY): Payer: Self-pay

## 2021-12-26 NOTE — Progress Notes (Signed)
[name], [name] ([id number]) Visit Report for 12/20/2021 Chief Complaint Document Details Patient Name: Date of Service: [name], [name] 12/20/2021 10:00 A M Medical Record Number: [id number] Patient Account Number: [id number] Date of Birth/Sex: Treating RN: 1965-07-21 (56 y.o. [name] Primary Care Provider: [name] Other Clinician: Referring Provider: Treating Provider/Extender: [name] Weeks in Treatment: 64 Information Obtained from: Patient Chief Complaint 03/28/2021; Back wound Electronic Signature(s) Signed: 12/20/2021 3:02:11 PM By: Kalman Shan DO Entered By: Kalman Shan on 12/20/2021 13:24:07 -------------------------------------------------------------------------------- Cellular or Tissue Based Product Details Patient Name: Date of Service: Christian Brown E. 12/20/2021 10:00 A M Medical Record Number: [id number] Patient Account Number: [id number] Date of Birth/Sex: Treating RN: [birth year] (56 y.o. [name] Primary Care Provider: [name] Other Clinician: Referring Provider: Treating Provider/Extender: [name] in Treatment: 38 Cellular or Tissue Based Product Type Wound #1 Midline Back Applied to: Performed By: Physician Kalman Shan, DO Cellular or Tissue Based Product Type: Epicord Level of Consciousness (Pre-procedure): Awake and Alert Pre-procedure Verification/Time Out Yes - 10:35 Taken: Location: trunk / arms / legs Wound Size (sq cm): 4.32 Product Size (sq cm): 6 Waste Size (sq cm): 0 Amount of Product Applied (sq cm): 6 Instrument Used: Forceps Lot #: [id number] Order #: [id number] Expiration Date: 11/12/2024 Fenestrated: No Reconstituted: No Secured: Yes Secured With: Steri-Strips Dressing Applied: Yes Primary Dressing: adaptic, gauze Procedural Pain: 0 Post Procedural Pain: 0 Response to Treatment: Procedure was tolerated well Level of Consciousness (Post- Awake and  Alert procedure): Post Procedure Diagnosis Same as Pre-procedure Electronic Signature(s) Signed: 12/20/2021 12:28:23 PM By: Sharyn Creamer RN, BSN Signed: 12/20/2021 3:02:11 PM By: Kalman Shan DO Entered By: Sharyn Creamer on 12/20/2021 10:44:04 -------------------------------------------------------------------------------- Debridement Details Patient Name: Date of Service: Christian Brown E. 12/20/2021 10:00 A M Medical Record Number: [id number] Patient Account Number: [id number] Date of Birth/Sex: Treating RN: [birth year] (56 y.o. [name] Primary Care Provider: [name] Other Clinician: Referring Provider: Treating Provider/Extender: [name] in Treatment: 38 Debridement Performed for Assessment: Wound #1 Midline Back Performed By: Physician Kalman Shan, DO Debridement Type: Debridement Level of Consciousness (Pre-procedure): Awake and Alert Pre-procedure Verification/Time Out Yes - 10:35 Taken: Start Time: 10:38 Pain Control: Lidocaine 5% topical ointment T Area Debrided (L x W): otal 4.8 (cm) x 0.9 (cm) = 4.32 (cm) Tissue and other material debrided: Non-Viable, Slough, Subcutaneous, Slough Level: Skin/Subcutaneous Tissue Debridement Description: Excisional Instrument: Curette Bleeding: Minimum Hemostasis Achieved: Pressure Procedural Pain: 0 Post Procedural Pain: 0 Response to Treatment: Procedure was tolerated well Level of Consciousness (Post- Awake and Alert procedure): Post Debridement Measurements of Total Wound Length: (cm) 4.8 Width: (cm) 0.9 Depth: (cm) 0.4 Volume: (cm) 1.357 Character of Wound/Ulcer Post Debridement: Improved Post Procedure Diagnosis Same as Pre-procedure Electronic Signature(s) Signed: 12/20/2021 12:28:23 PM By: Sharyn Creamer RN, BSN Signed: 12/20/2021 3:02:11 PM By: Kalman Shan DO Entered By: Sharyn Creamer on 12/20/2021  10:38:55 -------------------------------------------------------------------------------- HPI Details Patient Name: Date of Service: Christian Brown E. 12/20/2021 10:00 A M Medical Record Number: [id number] Patient Account Number: [id number] Date of Birth/Sex: Treating RN: [birth year] (56 y.o. [name] Primary Care Provider: [name] Other Clinician: Referring Provider: Treating Provider/Extender: [name] Weeks in Treatment: 60 History of Present Illness HPI Description: Admission 03/28/2021 [name] is a 56 year old male with a past medical history of controlled type 2 diabetes on oral agents, obesity and OSA that presents to the clinic for a back  wound. On 01/11/2021 patient had a laminectomy with PLIF of L1-S1 by Dr. Venetia Constable because of lumbar stenosis and radiculopathy. He subsequently developed bacteremia. He had CT imaging on 10/13 of the lumbar spine that showed fluid collection in the soft tissue of the posterior L1 and S1 and was taken to the OR for washout on 10/14. 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 He received 4 weeks of IV antibiotics by infectious disease. After his completion of 4 weeks of IV antibiotics he was continued for an additional 4 weeks of IV cefazolin with a stop date of 12/29. He has been evaluated by plastic surgery and no plans for surgical intervention at this time. Wife is present and reports he has been on the wound VAC for the past 8 weeks with improvement in wound healing. He currently denies systemic signs of infection. 12/22; patient presents for follow-up. He reports no issues since last clinic visit. He denies signs of infection. He has been tolerating the wound VAC well. 12/30; patient presents for follow-up. He reports no issues and has no complaints today. He has been tolerating the wound VAC well. 1/9; patient presents for follow-up. He has no issues or complaints today. He states  he feels well. He has had no problems with the wound VAC. 1/16; patient presents for follow-up. He continues to use the wound VAC with no issues. He denies signs of infection. 1/23; patient presents for follow-up. He has been switched from IV cefazolin to oral cefadroxil by infectious disease. He has no issues or complaints today. He denies signs of infection. He continues to tolerate the wound VAC well. 1/30; patient presents for follow-up. He continues to tolerate the wound VAC well. 2/6; patient presents for follow-up. He has no issues or complaints today. He continues to tolerate the wound VAC well. He denies signs of infection. 2/13; patient presents for follow-up. He continues to do well with the wound VAC. He denies any issues. 2/27; patient presents for follow-up. He continues to use the wound VAC without any issues. He denies signs of infection. 3/20; patient presents for follow-up. He has no issues or complaints today. He continues to use the wound VAC. 4/3; patient presents for follow-up. He continues to use the wound VAC without issues. He denies signs of infection. 4/17; 2-week follow-up. He continues to do well. His measurements are improved. Initially a surgical wound complicated by infection. 5/1; patient presents for follow-up. He has no issues or complaints today. He continues to tolerate the wound VAC well. He denies signs of infection. 5/15; patient presents for follow-up. He has noted more maceration to the periwound. He has been using the wound VAC without issues. He currently denies signs of infection. 5/30; patient presents for follow-up. He has been tolerating the wound VAC well over the past 2 weeks. He no longer has maceration to the periwound. He has no issues or complaints today. 6/8; this patient with a postsurgical wound that was complicated by postop infection. He has been using silver collagen under wound VAC and gradually doing well improvement in dimensions  especially the tunnel at 12:00 6/22; patient presents for follow-up. We have been using silver collagen under the wound VAC. Patient has no issues or questions today. 7/6; patient presents for follow-up. Patient continues to use collagen under the wound VAC with no issues. 7/20; patient presents for follow-up. He has been using collagen under the wound VAC with no issues. He has been approved for Epicord.  This was discussed with the patient and he is in agreement to having this placed at next clinic visit. 7/27; patient presents for follow-up. He has been tolerating the wound VAC well with collagen underneath. He has been approved for epi cord and we have this to place in office today. 8/4; patient presents for follow-up. He had Epicort placed at last clinic visit. We held off on the wound VAC. He tolerated this well. He has no issues or complaints today. 8/11; patient presents for follow-up. Epicord was placed at last clinic visit. He had the wound VAC on for the past week. He states that no drainage was suctioned into the canister. He has slight maceration to the periwound. 8/18; patient presents for follow-up. Epicord #3 was placed in standard fashion last week. The wound VAC was held. He has no issues or complaints today. 8/25; Patient presents for follow-up. Epicord #4 was placed in standard fashion last week. The wound VAC was held again. He has no issues or complaints today. There has been improvement in wound healing. 9/1; patient presents for follow-up. Epicort #5 was placed in standard fashion last week. He has no issues or complaints today. 9/8; patient presents for follow-up. Epicord #6 was placed in standard fashion last week. He tolerated this well and has no issues or complaints today. Electronic Signature(s) Signed: 12/20/2021 3:02:11 PM By: Kalman Shan DO Entered By: Kalman Shan on 12/20/2021  13:24:29 -------------------------------------------------------------------------------- Physical Exam Details Patient Name: Date of Service: Christian Brown E. 12/20/2021 10:00 A M Medical Record Number: [id number] Patient Account Number: [id number] Date of Birth/Sex: Treating RN: Sep 12, 1965 (56 y.o. [name] Primary Care Provider: [name] Other Clinician: Referring Provider: Treating Provider/Extender: [name] Weeks in Treatment: 19 Constitutional respirations regular, non-labored and within target range for patient.Marland Kitchen Psychiatric pleasant and cooperative. Notes T the lumbar spine over an incision site there is a dehisced wound with tunneling at the 12 o'clock position. Granulation tissue and nonviable tissue present. No o surrounding signs of infection. Electronic Signature(s) Signed: 12/20/2021 3:02:11 PM By: Kalman Shan DO Entered By: Kalman Shan on 12/20/2021 13:24:50 -------------------------------------------------------------------------------- Physician Orders Details Patient Name: Date of Service: Christian Brown E. 12/20/2021 10:00 A M Medical Record Number: [id number] Patient Account Number: [id number] Date of Birth/Sex: Treating RN: 24-Nov-1965 (56 y.o. [name] Primary Care Provider: [name] Other Clinician: Referring Provider: Treating Provider/Extender: [name] in Treatment: 47 Verbal / Phone Orders: No Diagnosis Coding Follow-up Appointments ppointment in 1 week. - Friday Dr. Heber Orient and Allayne Butcher, Rm # 9 Return A Anesthetic (In clinic) Topical Lidocaine 5% applied to wound bed Cellular or Tissue Based Products Cellular or Tissue Based Product Type: - Epicord #3 applied 11/22/21 Epicord #4 11/29/21 Epicord #5 12/06/2021 Epicord #6 12/13/21 daptic or Mepitel. (DO NOT REMOVE). - leave Cellular or Tissue Based Product applied to wound bed, secured with steri-strips, cover with A the adaptic and  steri-strips in place. Bathing/ Shower/ Hygiene May shower with protection but do not get wound dressing(s) wet. Negative Presssure Wound Therapy Discontinue wound vac - Patient to call [facility] to pick up wound vac. Additional Orders / Instructions Follow Nutritious Diet - Continue to monitor blood sugars daily Wound Treatment Wound #1 - Back Wound Laterality: Midline Peri-Wound Care: Skin Prep Every Other Day/30 Days Discharge Instructions: Use skin prep as directed Prim Dressing: Epicord Every Other Day/30 Days ary Discharge Instructions: applied by provider directly to wound bed. Secondary Dressing: ADAPTIC TOUCH 3x4.25 in  Every Other Day/30 Days Discharge Instructions: Applied over primary dressing secured with steri-strips as directed. Secondary Dressing: Woven Gauze Sponge, Non-Sterile 4x4 in Every Other Day/30 Days Discharge Instructions: Apply over primary dressing as directed. Secondary Dressing: Zetuvit Plus Silicone Border Dressing 7x7(in/in) (Generic) Every Other Day/30 Days Discharge Instructions: Apply silicone border over primary dressing as directed. Electronic Signature(s) Signed: 12/20/2021 3:02:11 PM By: Kalman Shan DO Previous Signature: 12/20/2021 12:28:23 PM Version By: Sharyn Creamer RN, BSN Entered By: Kalman Shan on 12/20/2021 13:24:59 -------------------------------------------------------------------------------- Problem List Details Patient Name: Date of Service: Christian Brown E. 12/20/2021 10:00 A M Medical Record Number: [id number] Patient Account Number: [id number] Date of Birth/Sex: Treating RN: Oct 27, 1965 (56 y.o. 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 12/20/2021 10:00 A M Medical Record Number: [id number] Patient Account Number: [id number] Date of Birth/Sex: Treating RN: 24-Oct-1965 (56 y.o. [name] Primary Care Provider: [name] Other Clinician: Referring Provider: Treating Provider/Extender: [name] Weeks in Treatment: 16 Subjective Chief Complaint Information obtained from Patient 03/28/2021; Back wound History of Present Illness (HPI) Admission 03/28/2021 Mr. Haskel Dewalt is a 56 year old male with a past medical history of controlled type 2 diabetes on oral agents, obesity and OSA that presents to the clinic for a back wound. On 01/11/2021 patient had a laminectomy with PLIF of L1-S1 by Dr. Venetia Constable because of lumbar stenosis and radiculopathy. He subsequently developed bacteremia. He had CT imaging on 10/13 of the lumbar spine that showed fluid collection in the soft tissue of the posterior L1 and S1 and was taken to the OR for washout on 10/14. 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 He received 4 weeks of IV antibiotics by infectious disease. After his completion of 4 weeks of IV antibiotics he was continued for an additional 4 weeks of IV cefazolin with a stop date of 12/29. He has been evaluated by plastic surgery and no plans for surgical intervention at this time. Wife is present and reports he has been on the wound VAC for the past 8 weeks with improvement in wound healing. He currently denies systemic signs of infection. 12/22; patient  presents for follow-up. He reports no issues since last clinic visit. He denies signs of infection. He has been tolerating the wound VAC well. 12/30; patient presents for follow-up. He reports no issues and has no complaints today. He has been tolerating the wound VAC well. 1/9; patient presents for follow-up. He has no issues or complaints today. He states he feels well. He has had no problems with the wound VAC. 1/16; patient presents for follow-up. He continues to use the wound VAC with no issues. He denies signs of infection. 1/23; patient presents for follow-up. He has been switched from IV cefazolin to oral cefadroxil by infectious disease. He has no issues or complaints today. He denies signs of infection. He continues to tolerate the wound VAC well. 1/30; patient presents for follow-up. He continues to tolerate  the wound VAC well. 2/6; patient presents for follow-up. He has no issues or complaints today. He continues to tolerate the wound VAC well. He denies signs of infection. 2/13; patient presents for follow-up. He continues to do well with the wound VAC. He denies any issues. 2/27; patient presents for follow-up. He continues to use the wound VAC without any issues. He denies signs of infection. 3/20; patient presents for follow-up. He has no issues or complaints today. He continues to use the wound VAC. 4/3; patient presents for follow-up. He continues to use the wound VAC without issues. He denies signs of infection. 4/17; 2-week follow-up. He continues to do well. His measurements are improved. Initially a surgical wound complicated by infection. 5/1; patient presents for follow-up. He has no issues or complaints today. He continues to tolerate the wound VAC well. He denies signs of infection. 5/15; patient presents for follow-up. He has noted more maceration to the periwound. He has been using the wound VAC without issues. He currently denies signs of infection. 5/30; patient presents  for follow-up. He has been tolerating the wound VAC well over the past 2 weeks. He no longer has maceration to the periwound. He has no issues or complaints today. 6/8; this patient with a postsurgical wound that was complicated by postop infection. He has been using silver collagen under wound VAC and gradually doing well improvement in dimensions especially the tunnel at 12:00 6/22; patient presents for follow-up. We have been using silver collagen under the wound VAC. Patient has no issues or questions today. 7/6; patient presents for follow-up. Patient continues to use collagen under the wound VAC with no issues. 7/20; patient presents for follow-up. He has been using collagen under the wound VAC with no issues. He has been approved for Epicord. This was discussed with the patient and he is in agreement to having this placed at next clinic visit. 7/27; patient presents for follow-up. He has been tolerating the wound VAC well with collagen underneath. He has been approved for epi cord and we have this to place in office today. 8/4; patient presents for follow-up. He had Epicort placed at last clinic visit. We held off on the wound VAC. He tolerated this well. He has no issues or complaints today. 8/11; patient presents for follow-up. Epicord was placed at last clinic visit. He had the wound VAC on for the past week. He states that no drainage was suctioned into the canister. He has slight maceration to the periwound. 8/18; patient presents for follow-up. Epicord #3 was placed in standard fashion last week. The wound VAC was held. He has no issues or complaints today. 8/25; Patient presents for follow-up. Epicord #4 was placed in standard fashion last week. The wound VAC was held again. He has no issues or complaints today. There has been improvement in wound healing. 9/1; patient presents for follow-up. Epicort #5 was placed in standard fashion last week. He has no issues or complaints  today. 9/8; patient presents for follow-up. Epicord #6 was placed in standard fashion last week. He tolerated this well and has no issues or complaints today. Patient History Information obtained from Patient. Family History Diabetes - Mother, Stroke - Siblings, No family history of Cancer, Heart Disease, Hereditary Spherocytosis, Hypertension, Kidney Disease, Lung Disease, Seizures, Thyroid Problems, Tuberculosis. Social History Never smoker, Marital Status - Married, Alcohol Use - Rarely, Drug Use - No History, Caffeine Use - Rarely. Medical History Cardiovascular Patient has history of Hypertension Endocrine Patient has history  of Type II Diabetes Musculoskeletal Patient has history of Osteomyelitis Medical A Surgical History Notes nd Musculoskeletal DDD Objective Constitutional respirations regular, non-labored and within target range for patient.. Vitals Time Taken: 10:16 AM, Temperature: 97.7 F, Pulse: 76 bpm, Respiratory Rate: 18 breaths/min, Blood Pressure: 126/75 mmHg. Psychiatric pleasant and cooperative. General Notes: T the lumbar spine over an incision site there is a dehisced wound with tunneling at the 12 o'clock position. Granulation tissue and nonviable o tissue present. No surrounding signs of infection. Integumentary (Hair, Skin) Wound #1 status is Open. Original cause of wound was Surgical Injury. The date acquired was: 01/11/2021. The wound has been in treatment 38 weeks. The wound is located on the Midline Back. The wound measures 4.8cm length x 0.9cm width x 0.4cm depth; 3.393cm^2 area and 1.357cm^3 volume. There is Fat Layer (Subcutaneous Tissue) exposed. There is no undermining noted, however, there is tunneling at 12:00 with a maximum distance of 0.5cm. There is a medium amount of serosanguineous drainage noted. The wound margin is distinct with the outline attached to the wound base. There is large (67-100%) red granulation within the wound bed. There is  a small (1-33%) amount of necrotic tissue within the wound bed including Adherent Slough. 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 I debrided nonviable tissue. Epicord #7 was placed in standard fashion today. Follow-up in 1 week. Procedures Wound #1 Pre-procedure diagnosis of Wound #1 is an Open Surgical Wound located on the Midline Back . There was a Excisional Skin/Subcutaneous Tissue Debridement with a total area of 4.32 sq cm performed by Kalman Shan, DO. With the following instrument(s): Curette to remove Non-Viable tissue/material. Material removed includes Subcutaneous Tissue and Slough and after achieving pain control using Lidocaine 5% topical ointment. No specimens were taken. A time out was conducted at 10:35, prior to the start of the procedure. A Minimum amount of bleeding was controlled with Pressure. The procedure was tolerated well with a pain level of 0 throughout and a pain level of 0 following the procedure. Post Debridement Measurements: 4.8cm length x 0.9cm width x 0.4cm depth; 1.357cm^3 volume. Character of Wound/Ulcer Post Debridement is improved. Post procedure Diagnosis Wound #1: Same as Pre-Procedure Pre-procedure diagnosis of Wound #1 is an Open Surgical Wound located on the Midline Back. A skin graft procedure using a bioengineered skin substitute/cellular or tissue based product was performed by Kalman Shan, DO with the following instrument(s): Forceps. Epicord was applied and secured with Steri-Strips. 6 sq cm of product was utilized and 0 sq cm was wasted. Post Application, adaptic, gauze was applied. A Time Out was conducted at 10:35, prior to the start of the procedure. The procedure was tolerated well with a pain level of 0 throughout and a pain level of 0 following the procedure. Post procedure  Diagnosis Wound #1: Same as Pre-Procedure . Plan Follow-up Appointments: Return Appointment in 1 week. - Friday Dr. Heber Hayward and Allayne Butcher, Rm # 9 Anesthetic: (In clinic) Topical Lidocaine 5% applied to wound bed Cellular or Tissue Based Products: Cellular or Tissue Based Product Type: - Epicord #3 applied 11/22/21 Epicord #4 11/29/21 Epicord #5 12/06/2021 Epicord #6 12/13/21 Cellular or Tissue Based Product applied to wound bed, secured with steri-strips, cover with Adaptic or Mepitel. (DO NOT REMOVE). - leave the adaptic and steri-strips in place. Bathing/ Shower/ Hygiene: May shower with protection but do not get wound dressing(s) wet. Negative  Presssure Wound Therapy: Discontinue wound vac - Patient to call [facility] to pick up wound vac. Additional Orders / Instructions: Follow Nutritious Diet - Continue to monitor blood sugars daily WOUND #1: - Back Wound Laterality: Midline Peri-Wound Care: Skin Prep Every Other Day/30 Days Discharge Instructions: Use skin prep as directed Prim Dressing: Epicord Every Other Day/30 Days ary Discharge Instructions: applied by provider directly to wound bed. Secondary Dressing: ADAPTIC TOUCH 3x4.25 in Every Other Day/30 Days Discharge Instructions: Applied over primary dressing secured with steri-strips as directed. Secondary Dressing: Woven Gauze Sponge, Non-Sterile 4x4 in Every Other Day/30 Days Discharge Instructions: Apply over primary dressing as directed. Secondary Dressing: Zetuvit Plus Silicone Border Dressing 7x7(in/in) (Generic) Every Other Day/30 Days Discharge Instructions: Apply silicone border over primary dressing as directed. 1. In office sharp debridement 2. Epicord #7 placed in standard fashion 3. Follow-up in 1 week Electronic Signature(s) Signed: 12/20/2021 3:02:11 PM By: Kalman Shan DO Entered By: Kalman Shan on 12/20/2021 13:27:30 -------------------------------------------------------------------------------- HxROS  Details Patient Name: Date of Service: Christian Brown E. 12/20/2021 10:00 A M Medical Record Number: [id number] Patient Account Number: [id number] Date of Birth/Sex: Treating RN: [birth year] (56 y.o. [name], [place] Primary Care Provider: [name] Other Clinician: Referring Provider: Treating Provider/Extender: [name] Weeks in Treatment: 38 Information Obtained From Patient Cardiovascular Medical History: Positive for: Hypertension Endocrine Medical History: Positive for: Type II Diabetes Time with diabetes: Since mid 90's Treated with: Oral agents Blood sugar tested every day: Yes Tested : 2x day Musculoskeletal Medical History: Positive for: Osteomyelitis Past Medical History Notes: DDD Immunizations Pneumococcal Vaccine: Received Pneumococcal Vaccination: Yes Received Pneumococcal Vaccination On or After 60th Birthday: No Implantable Devices Yes Family and Social History Cancer: No; Diabetes: Yes - Mother; Heart Disease: No; Hereditary Spherocytosis: No; Hypertension: No; Kidney Disease: No; Lung Disease: No; Seizures: No; Stroke: Yes - Siblings; Thyroid Problems: No; Tuberculosis: No; Never smoker; Marital Status - Married; Alcohol Use: Rarely; Drug Use: No History; Caffeine Use: Rarely; Financial Concerns: No; Food, Clothing or Shelter Needs: No; Support System Lacking: No; Transportation Concerns: No Electronic Signature(s) Signed: 12/20/2021 3:02:11 PM By: Kalman Shan DO Signed: 12/26/2021 4:08:15 PM By: [name] RN Entered By: Kalman Shan on 12/20/2021 13:24:34 -------------------------------------------------------------------------------- SuperBill Details Patient Name: Date of Service: Christian Grout. 12/20/2021 Medical Record Number: [id number] Patient Account Number: [id number] Date of Birth/Sex: Treating RN: 1966-02-03 (56 y.o. 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 M86.9 Osteomyelitis, unspecified E11.622 Type 2 diabetes mellitus with other skin ulcer Modifier: Quantity: 1 Physician Procedures : CPT4 Code Description Modifier [id number] - WC PHYS SKIN SUB GRAFT TRNK/ARM/LEG ICD-10 Diagnosis Description 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

## 2021-12-27 ENCOUNTER — Encounter (HOSPITAL_BASED_OUTPATIENT_CLINIC_OR_DEPARTMENT_OTHER): Payer: No Typology Code available for payment source | Admitting: Internal Medicine

## 2021-12-27 DIAGNOSIS — E11622 Type 2 diabetes mellitus with other skin ulcer: Secondary | ICD-10-CM | POA: Diagnosis not present

## 2021-12-27 NOTE — Progress Notes (Signed)
[name] ([id number]) Visit Report for 12/27/2021 Arrival Information Details Patient Name: Date of Service: [name], [name] 12/27/2021 10:00 A M Medical Record Number: [id number] Patient Account Number: [id number] Date of Birth/Sex: Treating RN: July 16, 1965 (56 y.o. [name], [name] Primary Care [name]: [name] Other Clinician: Referring Lonn Im: Treating Bernon Arviso/Extender: [name] in Treatment: 39 Visit Information History Since Last Visit Added or deleted any medications: No Patient Arrived: Ambulatory Any new allergies or adverse reactions: No Arrival Time: 10:20 Had a fall or experienced change in No Accompanied By: self activities of daily living that may affect Transfer Assistance: None risk of falls: Patient Identification Verified: Yes Signs or symptoms of abuse/neglect since last visito No Secondary Verification Process Completed: Yes Hospitalized since last visit: No Patient Requires Transmission-Based Precautions: No Implantable device outside of the clinic excluding No Patient Has Alerts: Yes cellular tissue based products placed in the center Patient Alerts: Patient on Blood Thinner since last visit: No BP Right Arm-PICC Has Dressing in Place as Prescribed: Yes Pain Present Now: No Electronic Signature(s) Signed: 12/27/2021 12:29:09 PM By: [name] RN Entered By: [name] on 12/27/2021 10:20:51 -------------------------------------------------------------------------------- Encounter Discharge Information Details Patient Name: Date of Service: Christian Brown E. 12/27/2021 10:00 A M Medical Record Number: [id number] Patient Account Number: [id number] Date of Birth/Sex: Treating RN: [name] 31, 1967 (56 y.o. [name] Primary Care [name]: [name] Other Clinician: Referring Shakina Choy: Treating [name]/Extender: [name] in Treatment: 39 Encounter Discharge Information Items Post  Procedure Vitals Discharge Condition: Stable Temperature (F): 98.7 Ambulatory Status: Ambulatory Pulse (bpm): 74 Discharge Destination: Home Respiratory Rate (breaths/min): 17 Transportation: Private Auto Blood Pressure (mmHg): 120/80 Accompanied By: self Schedule Follow-up Appointment: Yes Clinical Summary of Care: Patient Declined Electronic Signature(s) Signed: 12/27/2021 12:29:09 PM By: [name] RN Entered By: [name] on 12/27/2021 10:52:36 -------------------------------------------------------------------------------- Lower Extremity Assessment Details Patient Name: Date of Service: Christian Brown E. 12/27/2021 10:00 A M Medical Record Number: [id number] Patient Account Number: [id number] Date of Birth/Sex: Treating RN: Sep 21, 1965 (56 y.o. [name] Primary Care Jamilet Ambroise: [name] Other Clinician: Referring Jansen Sciuto: Treating [name]/Extender: [name] in Treatment: 39 Electronic Signature(s) Signed: 12/27/2021 12:29:09 PM By: [name] RN Entered By: [name] on 12/27/2021 10:18:52 -------------------------------------------------------------------------------- Multi Wound Chart Details Patient Name: Date of Service: Christian Brown E. 12/27/2021 10:00 A M Medical Record Number: [id number] Patient Account Number: [id number] Date of Birth/Sex: Treating RN: 1965-11-09 (56 y.o. M) Primary Care Adylynn Hertenstein: [name] Other Clinician: Referring Jayziah Bankhead: Treating Earlin Sweeden/Extender: [name] in Treatment: 39 Vital Signs Height(in): Pulse(bpm): 74 Weight(lbs): Blood Pressure(mmHg): 133/73 Body Mass Index(BMI): Temperature(F): 98.74 Respiratory Rate(breaths/min): 17 Photos: [N/A:N/A] Midline Back N/A N/A Wound Location: Surgical Injury N/A N/A Wounding Event: Open Surgical Wound N/A N/A Primary Etiology: Hypertension, Type II Diabetes, N/A N/A Comorbid  History: Osteomyelitis 01/11/2021 N/A N/A Date Acquired: 60 N/A N/A Weeks of Treatment: Open N/A N/A Wound Status: No N/A N/A Wound Recurrence: 4.3x0.4x0.3 N/A N/A Measurements L x W x D (cm) 1.351 N/A N/A A (cm) : rea 0.405 N/A N/A Volume (cm) : 98.20% N/A N/A % Reduction in Area: 99.80% N/A N/A % Reduction in Volume: Full Thickness With Exposed Support N/A N/A Classification: Structures Medium N/A N/A Exudate Amount: Serosanguineous N/A N/A Exudate Type: red, brown N/A N/A Exudate Color: Distinct, outline attached N/A N/A Wound Margin: Large (67-100%) N/A N/A Granulation Amount: Red N/A N/A Granulation Quality: Small (1-33%) N/A N/A  Necrotic Amount: Fat Layer (Subcutaneous Tissue): Yes N/A N/A Exposed Structures: Fascia: No Tendon: No Muscle: No Joint: No Bone: No Large (67-100%) N/A N/A Epithelialization: Cellular or Tissue Based Product N/A N/A Procedures Performed: Treatment Notes Wound #1 (Back) Wound Laterality: Midline Cleanser Peri-Wound Care Skin Prep Discharge Instruction: Use skin prep as directed Topical Primary Dressing Epicord Discharge Instruction: applied by [name] directly to wound bed. Secondary Dressing ADAPTIC TOUCH 3x4.25 in Discharge Instruction: Applied over primary dressing secured with steri-strips as directed. Woven Gauze Sponge, Non-Sterile 4x4 in Discharge Instruction: Apply over primary dressing as directed. Zetuvit Plus Silicone Border Dressing 7x7(in/in) Discharge Instruction: Apply silicone border over primary dressing as directed. Secured With Compression Wrap Compression Stockings Environmental education officer) Signed: 12/27/2021 1:12:16 PM By: Christian Ham [name] Entered By: [name] on 12/27/2021 11:17:38 -------------------------------------------------------------------------------- Multi-Disciplinary Care Plan Details Patient Name: Date of Service: Christian Brown E. 12/27/2021 10:00 A M Medical  Record Number: [id number] Patient Account Number: [id number] Date of Birth/Sex: Treating RN: 01/06/1966 (56 y.o. [name], [name] Primary Care Talley Casco: [name] Other Clinician: Referring Taniqua Issa: Treating [name]/Extender: [name] in Treatment: Christian reviewed with physician Active Inactive Wound/Skin Impairment Nursing Diagnoses: Impaired tissue integrity Goals: Patient/caregiver will verbalize understanding of skin care regimen Date Initiated: 03/28/2021 Target Resolution Date: 01/11/2022 Goal Status: Active Ulcer/skin breakdown will have a volume reduction of 30% by week 4 Date Initiated: 03/28/2021 Date Inactivated: 05/27/2021 Target Resolution Date: 05/31/2021 Goal Status: Met Interventions: Assess patient/caregiver ability to obtain necessary supplies Assess patient/caregiver ability to perform ulcer/skin care regimen upon admission and as needed Assess ulceration(s) every visit Provide education on ulcer and skin care Treatment Activities: Topical wound management initiated : 03/28/2021 Notes: Electronic Signature(s) Signed: 12/27/2021 12:29:09 PM By: [name] RN Entered By: [name] on 12/27/2021 10:26:00 -------------------------------------------------------------------------------- Pain Assessment Details Patient Name: Date of Service: Christian Brown E. 12/27/2021 10:00 A M Medical Record Number: [id number] Patient Account Number: [id number] Date of Birth/Sex: Treating RN: 1965-10-06 (56 y.o. [name] Primary Care Lihanna Biever: [name] Other Clinician: Referring Juventino Pavone: Treating Brihana Quickel/Extender: [name] in Treatment: 39 Active Problems Location of Pain Severity and Description of Pain Patient Has Paino No Site Locations Pain Management and Medication Current Pain Management: Electronic Signature(s) Signed: 12/27/2021 12:29:09 PM By: [name] RN Entered By: [name] on 12/27/2021 10:19:00 -------------------------------------------------------------------------------- Patient/Caregiver Education Details Patient Name: Date of Service: [name] 9/15/2023andnbsp10:00 [place] Record Number: [id number] Patient Account Number: [id number] Date of Birth/Gender: Treating RN: [birth year] (56 y.o. [name] Primary Care Physician: [name] Other Clinician: Referring Physician: Treating Physician/Extender: [name] in Treatment: 39 Education Assessment Education Provided To: Patient Education Topics Provided Wound/Skin Impairment: Methods: Explain/Verbal Responses: Reinforcements needed, State content correctly Electronic Signature(s) Signed: 12/27/2021 12:29:09 PM By: [name] RN Entered By: [name] on 12/27/2021 10:44:17 -------------------------------------------------------------------------------- Wound Assessment Details Patient Name: Date of Service: Christian Brown E. 12/27/2021 10:00 A M Medical Record Number: [id number] Patient Account Number: [id number] Date of Birth/Sex: Treating RN: 1965/06/08 (56 y.o. [name] Primary Care Leilanny Fluitt: [name] Other Clinician: Referring Chelsy Parrales: Treating Aniket Paye/Extender: [name] in Treatment: 39 Wound Status Wound Number: 1 Primary Etiology: Open Surgical Wound Wound Location: Midline Back Wound Status: Open Wounding Event: Surgical Injury Comorbid History: Hypertension, Type II Diabetes, Osteomyelitis Date Acquired: 01/11/2021 Weeks Of Treatment: 39 Clustered Wound: No Photos Wound Measurements Length: (cm) 4.3 Width: (cm)  0.4 Depth: (cm) 0.3 Area: (cm) 1.351 Volume: (cm) 0.405 % Reduction in Area: 98.2% % Reduction in Volume: 99.8% Epithelialization: Large (67-100%) Tunneling: No Undermining: No Wound Description Classification: Full Thickness  With Exposed Support Structures Wound Margin: Distinct, outline attached Exudate Amount: Medium Exudate Type: Serosanguineous Exudate Color: red, brown Foul Odor After Cleansing: No Slough/Fibrino Yes Wound Bed Granulation Amount: Large (67-100%) Exposed Structure Granulation Quality: Red Fascia Exposed: No Necrotic Amount: Small (1-33%) Fat Layer (Subcutaneous Tissue) Exposed: Yes Necrotic Quality: Adherent Slough Tendon Exposed: No Muscle Exposed: No Joint Exposed: No Bone Exposed: No Treatment Notes Wound #1 (Back) Wound Laterality: Midline Cleanser Peri-Wound Care Skin Prep Discharge Instruction: Use skin prep as directed Topical Primary Dressing Epicord Discharge Instruction: applied by Isola Mehlman directly to wound bed. Secondary Dressing ADAPTIC TOUCH 3x4.25 in Discharge Instruction: Applied over primary dressing secured with steri-strips as directed. Woven Gauze Sponge, Non-Sterile 4x4 in Discharge Instruction: Apply over primary dressing as directed. Zetuvit Plus Silicone Border Dressing 7x7(in/in) Discharge Instruction: Apply silicone border over primary dressing as directed. Secured With Compression Wrap Compression Stockings Add-Ons Electronic Signature(s) Signed: 12/27/2021 12:29:09 PM By: [name] RN Entered By: [name] on 12/27/2021 10:17:15 -------------------------------------------------------------------------------- Vitals Details Patient Name: Date of Service: Christian Brown E. 12/27/2021 10:00 A M Medical Record Number: [id number] Patient Account Number: [id number] Date of Birth/Sex: Treating RN: 1965-11-01 (56 y.o. [name] Primary Care Adonus Uselman: [name] Other Clinician: Referring Glenetta Kiger: Treating Reathel Turi/Extender: [name] in Treatment: 39 Vital Signs Time Taken: 10:20 Temperature (F): 98.74 Pulse (bpm): 74 Respiratory Rate (breaths/min): 17 Blood Pressure (mmHg):  133/73 Reference Range: 80 - 120 mg / dl Electronic Signature(s) Signed: 12/27/2021 12:29:09 PM By: [name] RN Entered By: [name] on 12/27/2021 10:20:37

## 2021-12-27 NOTE — Progress Notes (Signed)
DWYNE, HASEGAWA ([id number]) Visit Report for 12/27/2021 Cellular or Tissue Based Product Details Patient Name: Date of Service: NERY, FRAPPIER 12/27/2021 10:00 A M Medical Record Number: [id number] Patient Account Number: [id number] Date of Birth/Sex: Treating RN: 1965/10/14 (56 y.o. M) Primary Care Provider: [name] Other Clinician: Referring Provider: Treating Provider/Extender: [name] in Treatment: 39 Cellular or Tissue Based Product Type Wound #1 Midline Back Applied to: Performed By: Physician Ricard Dillon., MD Cellular or Tissue Based Product Type: Epicord Level of Consciousness (Pre-procedure): Awake and Alert Pre-procedure Verification/Time Out Yes - 10:23 Taken: Location: trunk / arms / legs Wound Size (sq cm): 1.72 Product Size (sq cm): 6 Waste Size (sq cm): 0 Amount of Product Applied (sq cm): 6 Instrument Used: Forceps, Scissors Lot #: [id number] Expiration Date: 08/13/2026 Fenestrated: No Reconstituted: Yes Solution Type: NS Solution Amount: 3ML Lot #: [id number] Solution Expiration Date: 01/12/2022 Secured: Yes Secured With: Steri-Strips Dressing Applied: Yes Primary Dressing: ADAPTIC Procedural Pain: 0 Post Procedural Pain: 0 Response to Treatment: Procedure was tolerated well Level of Consciousness (Post- Awake and Alert procedure): Post Procedure Diagnosis Same as Pre-procedure Electronic Signature(s) Signed: 12/27/2021 1:12:16 PM By: Linton Ham MD Entered By: Linton Ham on 12/27/2021 11:17:46 -------------------------------------------------------------------------------- HPI Details Patient Name: Date of Service: [name] 12/27/2021 10:00 A M Medical Record Number: [id number] Patient Account Number: [id number] Date of Birth/Sex: Treating RN: 06/20/1965 (56 y.o. M) Primary Care Provider: [name] Other Clinician: Referring Provider: Treating Provider/Extender: [name] in  Treatment: 39 History of Present Illness HPI Description: Admission 03/28/2021 Mr. Zaeem Kandel is a 56 year old male with a past medical history of controlled type 2 diabetes on oral agents, obesity and OSA that presents to the clinic for a back wound. On 01/11/2021 patient had a laminectomy with PLIF of L1-S1 by Dr. Venetia Constable because of lumbar stenosis and radiculopathy. He subsequently developed bacteremia. He had CT imaging on 10/13 of the lumbar spine that showed fluid collection in the soft tissue of the posterior L1 and S1 and was taken to the OR for washout on 10/14. 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 He received 4 weeks of IV antibiotics by infectious disease. After his completion of 4 weeks of IV antibiotics he was continued for an additional 4 weeks of IV cefazolin with a stop date of 12/29. He has been evaluated by plastic surgery and no plans for surgical intervention at this time. Wife is present and reports he has been on the wound VAC for the past 8 weeks with improvement in wound healing. He currently denies systemic signs of infection. 12/22; patient presents for follow-up. He reports no issues since last clinic visit. He denies signs of infection. He has been tolerating the wound VAC well. 12/30; patient presents for follow-up. He reports no issues and has no complaints today. He has been tolerating the wound VAC well. 1/9; patient presents for follow-up. He has no issues or complaints today. He states he feels well. He has had no problems with the wound VAC. 1/16; patient presents for follow-up. He continues to use the wound VAC with no issues. He denies signs of infection. 1/23; patient presents for follow-up. He has been switched from IV cefazolin to oral cefadroxil by infectious disease. He has no issues or complaints today. He denies signs of infection. He continues to tolerate the wound VAC well. 1/30; patient presents for follow-up. He  continues to tolerate the  wound VAC well. 2/6; patient presents for follow-up. He has no issues or complaints today. He continues to tolerate the wound VAC well. He denies signs of infection. 2/13; patient presents for follow-up. He continues to do well with the wound VAC. He denies any issues. 2/27; patient presents for follow-up. He continues to use the wound VAC without any issues. He denies signs of infection. 3/20; patient presents for follow-up. He has no issues or complaints today. He continues to use the wound VAC. 4/3; patient presents for follow-up. He continues to use the wound VAC without issues. He denies signs of infection. 4/17; 2-week follow-up. He continues to do well. His measurements are improved. Initially a surgical wound complicated by infection. 5/1; patient presents for follow-up. He has no issues or complaints today. He continues to tolerate the wound VAC well. He denies signs of infection. 5/15; patient presents for follow-up. He has noted more maceration to the periwound. He has been using the wound VAC without issues. He currently denies signs of infection. 5/30; patient presents for follow-up. He has been tolerating the wound VAC well over the past 2 weeks. He no longer has maceration to the periwound. He has no issues or complaints today. 6/8; this patient with a postsurgical wound that was complicated by postop infection. He has been using silver collagen under wound VAC and gradually doing well improvement in dimensions especially the tunnel at 12:00 6/22; patient presents for follow-up. We have been using silver collagen under the wound VAC. Patient has no issues or questions today. 7/6; patient presents for follow-up. Patient continues to use collagen under the wound VAC with no issues. 7/20; patient presents for follow-up. He has been using collagen under the wound VAC with no issues. He has been approved for Epicord. This was discussed with the patient and he is  in agreement to having this placed at next clinic visit. 7/27; patient presents for follow-up. He has been tolerating the wound VAC well with collagen underneath. He has been approved for epi cord and we have this to place in office today. 8/4; patient presents for follow-up. He had Epicort placed at last clinic visit. We held off on the wound VAC. He tolerated this well. He has no issues or complaints today. 8/11; patient presents for follow-up. Epicord was placed at last clinic visit. He had the wound VAC on for the past week. He states that no drainage was suctioned into the canister. He has slight maceration to the periwound. 8/18; patient presents for follow-up. Epicord #3 was placed in standard fashion last week. The wound VAC was held. He has no issues or complaints today. 8/25; Patient presents for follow-up. Epicord #4 was placed in standard fashion last week. The wound VAC was held again. He has no issues or complaints today. There has been improvement in wound healing. 9/1; patient presents for follow-up. Epicort #5 was placed in standard fashion last week. He has no issues or complaints today. 9/8; patient presents for follow-up. Epicord #6 was placed in standard fashion last week. He tolerated this well and has no issues or complaints today. 9/15; Epicort No. 7 the wound looks healthy. Perhaps some hyper granulated tissue in the lower 50% of the longitudinal wound Electronic Signature(s) Signed: 12/27/2021 1:12:16 PM By: Linton Ham MD Entered By: Linton Ham on 12/27/2021 11:18:35 -------------------------------------------------------------------------------- Physical Exam Details Patient Name: Date of Service: [name] 12/27/2021 10:00 A M Medical Record Number: [id number] Patient Account Number: [id number] Date of Birth/Sex: Treating  RN: Mar 28, 1966 (56 y.o. M) Primary Care Provider: [name] Other Clinician: Referring Provider: Treating Provider/Extender:  [name] in Treatment: 39 Notes Wound exam; lumbar spine incisional wound. Longitudinal small tunnel at 12:00 about 3 mm which I gather is a big improvement. The lower 50% almost looks hyper granulated. I did not debride this no evidence of surrounding infection Electronic Signature(s) Signed: 12/27/2021 1:12:16 PM By: Linton Ham MD Entered By: Linton Ham on 12/27/2021 11:20:14 -------------------------------------------------------------------------------- Physician Orders Details Patient Name: Date of Service: [name] 12/27/2021 10:00 A M Medical Record Number: [id number] Patient Account Number: [id number] Date of Birth/Sex: Treating RN: Oct 28, 1965 (56 y.o. [name] Primary Care Provider: [name] Other Clinician: Referring Provider: Treating Provider/Extender: [name] in Treatment: 39 Verbal / Phone Orders: No Diagnosis Coding Follow-up Appointments ppointment in 1 week. - Friday Dr. Heber [place] and Allayne Butcher, Rm # 9 Return A Anesthetic (In clinic) Topical Lidocaine 5% applied to wound bed Cellular or Tissue Based Products Cellular or Tissue Based Product Type: - Epicord #3 applied 11/22/21 Epicord #4 11/29/21 Epicord #5 12/06/2021 Epicord #6 12/13/21 Epicord # 7 12/20/21 Epicord # 8 12/27/21 daptic or Mepitel. (DO NOT REMOVE). - leave Cellular or Tissue Based Product applied to wound bed, secured with steri-strips, cover with A the adaptic and steri-strips in place. Bathing/ Shower/ Hygiene May shower with protection but do not get wound dressing(s) wet. Negative Presssure Wound Therapy Discontinue wound vac - Patient to call [facility] to pick up wound vac. Additional Orders / Instructions Follow Nutritious Diet - Continue to monitor blood sugars daily Wound Treatment Wound #1 - Back Wound Laterality: Midline Peri-Wound Care: Skin Prep Every Other Day/30 Days Discharge Instructions: Use skin prep as  directed Prim Dressing: Epicord Every Other Day/30 Days ary Discharge Instructions: applied by provider directly to wound bed. Secondary Dressing: ADAPTIC TOUCH 3x4.25 in Every Other Day/30 Days Discharge Instructions: Applied over primary dressing secured with steri-strips as directed. Secondary Dressing: Woven Gauze Sponge, Non-Sterile 4x4 in Every Other Day/30 Days Discharge Instructions: Apply over primary dressing as directed. Secondary Dressing: Zetuvit Plus Silicone Border Dressing 7x7(in/in) (Generic) Every Other Day/30 Days Discharge Instructions: Apply silicone border over primary dressing as directed. Electronic Signature(s) Signed: 12/27/2021 12:29:09 PM By: [name] RN Signed: 12/27/2021 1:12:16 PM By: Linton Ham MD Entered By: [name] on 12/27/2021 10:25:48 -------------------------------------------------------------------------------- Problem List Details Patient Name: Date of Service: [name] 12/27/2021 10:00 A M Medical Record Number: [id number] Patient Account Number: [id number] Date of Birth/Sex: Treating RN: [birth year] (56 y.o. 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 12/27/2021 10:00 A M Medical Record Number: [id number] Patient  Account Number: [id number] Date of Birth/Sex: Treating RN: February 12, 1966 (56 y.o. M) Primary Care Provider: [name] Other Clinician: Referring Provider: Treating Provider/Extender: [name] in Treatment: 39 Subjective History of Present Illness (HPI) Admission 03/28/2021 Mr. Sharone Almond is a 56 year old male with a past medical history of controlled type  2 diabetes on oral agents, obesity and OSA that presents to the clinic for a back wound. On 01/11/2021 patient had a laminectomy with PLIF of L1-S1 by Dr. Venetia Constable because of lumbar stenosis and radiculopathy. He subsequently developed bacteremia. He had CT imaging on 10/13 of the lumbar spine that showed fluid collection in the soft tissue of the posterior L1 and S1 and was taken to the OR for washout on 10/14. 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 He received 4 weeks of IV antibiotics by infectious disease. After his completion of 4 weeks of IV antibiotics he was continued for an additional 4 weeks of IV cefazolin with a stop date of 12/29. He has been evaluated by plastic surgery and no plans for surgical intervention at this time. Wife is present and reports he has been on the wound VAC for the past 8 weeks with improvement in wound healing. He currently denies systemic signs of infection. 12/22; patient presents for follow-up. He reports no issues since last clinic visit. He denies signs of infection. He has been tolerating the wound VAC well. 12/30; patient presents for follow-up. He reports no issues and has no complaints today. He has been tolerating the wound VAC well. 1/9; patient presents for follow-up. He has no issues or complaints today. He states he feels well. He has had no problems with the wound VAC. 1/16; patient presents for follow-up. He continues to use the wound VAC with no issues. He denies signs of infection. 1/23; patient presents for follow-up. He has been  switched from IV cefazolin to oral cefadroxil by infectious disease. He has no issues or complaints today. He denies signs of infection. He continues to tolerate the wound VAC well. 1/30; patient presents for follow-up. He continues to tolerate the wound VAC well. 2/6; patient presents for follow-up. He has no issues or complaints today. He continues to tolerate the wound VAC well. He denies signs of infection. 2/13; patient presents for follow-up. He continues to do well with the wound VAC. He denies any issues. 2/27; patient presents for follow-up. He continues to use the wound VAC without any issues. He denies signs of infection. 3/20; patient presents for follow-up. He has no issues or complaints today. He continues to use the wound VAC. 4/3; patient presents for follow-up. He continues to use the wound VAC without issues. He denies signs of infection. 4/17; 2-week follow-up. He continues to do well. His measurements are improved. Initially a surgical wound complicated by infection. 5/1; patient presents for follow-up. He has no issues or complaints today. He continues to tolerate the wound VAC well. He denies signs of infection. 5/15; patient presents for follow-up. He has noted more maceration to the periwound. He has been using the wound VAC without issues. He currently denies signs of infection. 5/30; patient presents for follow-up. He has been tolerating the wound VAC well over the past 2 weeks. He no longer has maceration to the periwound. He has no issues or complaints today. 6/8; this patient with a postsurgical wound that was complicated by postop infection. He has been using silver collagen under wound VAC and gradually doing well improvement in dimensions especially the tunnel at 12:00 6/22; patient presents for follow-up. We have been using silver collagen under the wound VAC. Patient has no issues or questions today. 7/6; patient presents for follow-up. Patient continues to use  collagen under the wound VAC with no issues. 7/20; patient presents for follow-up. He has  been using collagen under the wound VAC with no issues. He has been approved for Epicord. This was discussed with the patient and he is in agreement to having this placed at next clinic visit. 7/27; patient presents for follow-up. He has been tolerating the wound VAC well with collagen underneath. He has been approved for epi cord and we have this to place in office today. 8/4; patient presents for follow-up. He had Epicort placed at last clinic visit. We held off on the wound VAC. He tolerated this well. He has no issues or complaints today. 8/11; patient presents for follow-up. Epicord was placed at last clinic visit. He had the wound VAC on for the past week. He states that no drainage was suctioned into the canister. He has slight maceration to the periwound. 8/18; patient presents for follow-up. Epicord #3 was placed in standard fashion last week. The wound VAC was held. He has no issues or complaints today. 8/25; Patient presents for follow-up. Epicord #4 was placed in standard fashion last week. The wound VAC was held again. He has no issues or complaints today. There has been improvement in wound healing. 9/1; patient presents for follow-up. Epicort #5 was placed in standard fashion last week. He has no issues or complaints today. 9/8; patient presents for follow-up. Epicord #6 was placed in standard fashion last week. He tolerated this well and has no issues or complaints today. 9/15; Epicort No. 7 the wound looks healthy. Perhaps some hyper granulated tissue in the lower 50% of the longitudinal wound Objective Constitutional Vitals Time Taken: 10:20 AM, Temperature: 98.74 F, Pulse: 74 bpm, Respiratory Rate: 17 breaths/min, Blood Pressure: 133/73 mmHg. Integumentary (Hair, Skin) Wound #1 status is Open. Original cause of wound was Surgical Injury. The date acquired was: 01/11/2021. The wound has  been in treatment 39 weeks. The wound is located on the Midline Back. The wound measures 4.3cm length x 0.4cm width x 0.3cm depth; 1.351cm^2 area and 0.405cm^3 volume. There is Fat Layer (Subcutaneous Tissue) exposed. There is no tunneling or undermining noted. There is a medium amount of serosanguineous drainage noted. The wound margin is distinct with the outline attached to the wound base. There is large (67-100%) red granulation within the wound bed. There is a small (1-33%) amount of necrotic tissue within the wound bed including Adherent Slough. 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 A skin graft procedure using a bioengineered skin substitute/cellular or tissue based product was performed by Ricard Dillon., MD with the following instrument(s): Forceps and Scissors. Epicord was applied and secured with Steri-Strips. 6 sq cm of product was utilized and 0 sq cm was wasted. Post Application, ADAPTIC was applied. A Time Out was conducted at 10:23, prior to the start of the procedure. The procedure was tolerated well with a pain level of 0 throughout and a pain level of 0 following the procedure. Post procedure Diagnosis Wound #1: Same as Pre-Procedure . Plan Follow-up Appointments: Return Appointment in 1 week. - Friday Dr. Heber  and Allayne Butcher, Rm # 9 Anesthetic: (In clinic) Topical Lidocaine 5% applied to wound bed Cellular or Tissue Based Products: Cellular or Tissue Based Product Type: - Epicord #3 applied 11/22/21 Epicord #4 11/29/21 Epicord #5 12/06/2021 Epicord #6 12/13/21 Epicord # 7 12/20/21 Epicord # 8 12/27/21 Cellular or Tissue  Based Product applied to wound bed, secured with steri-strips, cover with Adaptic or Mepitel. (DO NOT REMOVE). -  leave the adaptic and steri-strips in place. Bathing/ Shower/ Hygiene: May shower with protection but do not get wound dressing(s) wet. Negative Presssure Wound Therapy: Discontinue wound vac - Patient to call [facility] to pick up wound vac. Additional Orders / Instructions: Follow Nutritious Diet - Continue to monitor blood sugars daily WOUND #1: - Back Wound Laterality: Midline Peri-Wound Care: Skin Prep Every Other Day/30 Days Discharge Instructions: Use skin prep as directed Prim Dressing: Epicord Every Other Day/30 Days ary Discharge Instructions: applied by provider directly to wound bed. Secondary Dressing: ADAPTIC TOUCH 3x4.25 in Every Other Day/30 Days Discharge Instructions: Applied over primary dressing secured with steri-strips as directed. Secondary Dressing: Woven Gauze Sponge, Non-Sterile 4x4 in Every Other Day/30 Days Discharge Instructions: Apply over primary dressing as directed. Secondary Dressing: Zetuvit Plus Silicone Border Dressing 7x7(in/in) (Generic) Every Other Day/30 Days Discharge Instructions: Apply silicone border over primary dressing as directed. 1. Epicort No. 7 in standard fashion. Wound is dramatically improved per our intake nurse Electronic Signature(s) Signed: 12/27/2021 1:12:16 PM By: Linton Ham MD Entered By: Linton Ham on 12/27/2021 11:21:03 -------------------------------------------------------------------------------- SuperBill Details Patient Name: Date of Service: [name] 12/27/2021 Medical Record Number: [id number] Patient Account Number: [id number] Date of Birth/Sex: Treating RN: 1965-10-14 (56 y.o. 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

## 2022-01-01 ENCOUNTER — Ambulatory Visit (INDEPENDENT_AMBULATORY_CARE_PROVIDER_SITE_OTHER): Payer: No Typology Code available for payment source | Admitting: Internal Medicine

## 2022-01-01 ENCOUNTER — Encounter: Payer: Self-pay | Admitting: Internal Medicine

## 2022-01-01 ENCOUNTER — Other Ambulatory Visit (HOSPITAL_COMMUNITY): Payer: Self-pay

## 2022-01-01 VITALS — BP 124/76 | HR 68 | Temp 97.9°F | Resp 18 | Ht 74.0 in | Wt 290.5 lb

## 2022-01-01 DIAGNOSIS — Z125 Encounter for screening for malignant neoplasm of prostate: Secondary | ICD-10-CM

## 2022-01-01 DIAGNOSIS — Z0000 Encounter for general adult medical examination without abnormal findings: Secondary | ICD-10-CM

## 2022-01-01 DIAGNOSIS — E118 Type 2 diabetes mellitus with unspecified complications: Secondary | ICD-10-CM

## 2022-01-01 DIAGNOSIS — I10 Essential (primary) hypertension: Secondary | ICD-10-CM

## 2022-01-01 DIAGNOSIS — E7800 Pure hypercholesterolemia, unspecified: Secondary | ICD-10-CM | POA: Diagnosis not present

## 2022-01-01 DIAGNOSIS — Z23 Encounter for immunization: Secondary | ICD-10-CM

## 2022-01-01 LAB — LIPID PANEL
Cholesterol: 103 mg/dL (ref 0–200)
HDL: 33.3 mg/dL — ABNORMAL LOW (ref >39.00)
LDL Cholesterol: 41 mg/dL (ref 0–99)
NonHDL: 70.09
Total CHOL/HDL Ratio: 3
Triglycerides: 147 mg/dL (ref 0.0–149.0)
VLDL: 29.4 mg/dL (ref 0.0–40.0)

## 2022-01-01 LAB — HEPATIC FUNCTION PANEL
ALT: 19 U/L (ref 0–53)
AST: 16 U/L (ref 0–37)
Albumin: 4 g/dL (ref 3.5–5.2)
Alkaline Phosphatase: 78 U/L (ref 39–117)
Bilirubin, Direct: 0.1 mg/dL (ref 0.0–0.3)
Total Bilirubin: 0.6 mg/dL (ref 0.2–1.2)
Total Protein: 6.8 g/dL (ref 6.0–8.3)

## 2022-01-01 LAB — MICROALBUMIN / CREATININE URINE RATIO
Creatinine,U: 96.8 mg/dL
Microalb Creat Ratio: 0.7 mg/g (ref 0.0–30.0)
Microalb, Ur: <0.7 mg/dL (ref 0.0–1.9)

## 2022-01-01 LAB — HEMOGLOBIN A1C: Hgb A1c MFr Bld: 6.2 % (ref 4.6–6.5)

## 2022-01-01 LAB — PSA: PSA: 1.95 ng/mL (ref 0.10–4.00)

## 2022-01-01 NOTE — Assessment & Plan Note (Signed)
--  Td 2013; booster  at the next opportunity - pnm 23: 2016; Prevnar at age 56. - shingrix  #1 today, next in few months - flu shot today - covid booster rec --prostate ca screening : DRE declined.  Check PSA --CCS--  Cscope 12-2014, 10 years  -Labs: LFTs, FLP, A1c, PSA -Lifestyle: Physical activity limited for now.

## 2022-01-01 NOTE — Patient Instructions (Addendum)
Vaccines I recommend:  Covid booster   GO TO THE LAB : Get the blood work     [place], [place] back for a checkup in 5 to 6 months   Per our records you are due for your diabetic eye exam. Please contact your eye doctor to schedule an appointment. Please have them send copies of your office visit notes to [name]. Our fax number is [phone]. If you need a referral to an eye doctor please let us know.

## 2022-01-01 NOTE — Assessment & Plan Note (Signed)
Here for CPX DM: Diet controlled, check A1c and micro HTN: On no medicines at this point. Hyperlipidemia: Continue Crestor, checking labs Morbid obesity: Significant weight loss in the last couple of years. Discitis, lumbar osteomyelitis: Making significant improvements, does not need a walker or cane.  On daily antibiotics indefinitely. OSA: Not using CPAP since November 2022.  Suspect weight loss has taken care of the problem.  Reassess periodically. RTC 5 to 6 months (will need Tdap and Shingrix booster)

## 2022-01-01 NOTE — Progress Notes (Signed)
Subjective:    Patient ID: [name], male    DOB: 08/27/1965, 56 y.o.   MRN: [id number]  DOS:  01/01/2022 Type of visit - description: CPX  Since the last office visit is doing okay. History of lumbar discitis and osteomyelitis, making progress.   Review of Systems  Other than above, a 14 point review of systems is negative     Past Medical History:  Diagnosis Date   DDD (degenerative disc disease), lumbar    Diabetes mellitus    Hyperlipemia    Hypertension    Obesity    OSA (obstructive sleep apnea)    on CPAP    Past Surgical History:  Procedure Laterality Date   APPLICATION OF ROBOTIC ASSISTANCE FOR SPINAL PROCEDURE N/A 01/11/2021   Procedure: APPLICATION OF ROBOTIC ASSISTANCE FOR SPINAL PROCEDURE;  Surgeon: Judith Part, MD;  Location: [facility];  Service: Neurosurgery;  Laterality: N/A;   APPLICATION OF WOUND VAC N/A 01/25/2021   Procedure: APPLICATION OF WOUND VAC;  Surgeon: Dawley, Theodoro Doing, DO;  Location: [facility];  Service: Neurosurgery;  Laterality: N/A;   BACK SURGERY     COLONOSCOPY  2019   LUMBAR WOUND DEBRIDEMENT N/A 01/25/2021   Procedure: Irrigation and Debridement of lumbar wound, and wound vacuum assisted closure;  Surgeon: Dawley, Theodoro Doing, DO;  Location: [facility];  Service: Neurosurgery;  Laterality: N/A;   TRANSFORAMINAL LUMBAR INTERBODY FUSION (TLIF) WITH PEDICLE SCREW FIXATION 4 LEVEL N/A 01/11/2021   Procedure: Lumbar one-two, Lumbar two-three, Lumbar three-four, Lumbar four-five, Lumbar five Sacral one Open decompression, Transforaminal lumbar interbody fusion, posterolateral instrumented fusion;  Surgeon: Judith Part, MD;  Location: [facility];  Service: Neurosurgery;  Laterality: N/A;   Social History   Socioeconomic History   Marital status: Married    Spouse name: [name]   Number of children: 2   Years of education: Not on file   Highest education level: Not on file  Occupational History   Occupation: retired-disability    Fish farm manager: [employer] Psychologist, counselling: [employer]  Tobacco Use   Smoking status: Never   Smokeless tobacco: Never  Vaping Use   Vaping Use: Never used  Substance and Sexual Activity   Alcohol use: No   Drug use: No   Sexual activity: Yes  Other Topics Concern   Not on file  Social History Narrative   Remarried for the 3rd time w/ a lady for the [place]   Children:  2010 , 2012   Left handed                Social Determinants of Health   Financial Resource Strain: Not on file  Food Insecurity: Not on file  Transportation Needs: Not on file  Physical Activity: Not on file  Stress: Not on file  Social Connections: Not on file  Intimate Partner Violence: Not on file    Current Outpatient Medications  Medication Instructions   ascorbic acid (VITAMIN C) 500 mg, Oral, 2 times daily   Blood Glucose Monitoring Suppl (FREESTYLE LITE) w/Device KIT Use to check blood sugar up to 4 times daily as directed   Blood Glucose Monitoring Suppl ([place]) w/Device KIT Test blood sugar twice a day.  Dx code: E11.9   cefadroxil (DURICEF) 500 mg, Oral, 2 times daily   cyclobenzaprine (FLEXERIL) 10 mg, Oral, 3 times daily PRN   diazepam (VALIUM) 5 mg, Oral, Daily at bedtime   diclofenac (VOLTAREN) 75 MG  EC tablet Take 1 tablet (75 mg total) by mouth daily as needed for arthritis pain- kidney function OK   fluticasone (FLONASE) 50 MCG/ACT nasal spray Nasal   gabapentin (NEURONTIN) 600 mg, Oral, 3 times daily   glucose blood (FREESTYLE LITE) test strip Use 1 strip to check blood sugar up to 4 times daily as directed   glucose blood (ONETOUCH VERIO) test strip Check blood sugars before meals and at bedtime.  twice daily   HYDROmorphone (DILAUDID) 2 mg, Oral, Every M-W-F, Take 30 minutes prior to Wound VAC change   Lancets (FREESTYLE) lancets Use 1 lancet to check blood sugar up to 4 times daily as directed   morphine (MS CONTIN) 15 MG 12 hr tablet Take 2 tablets (30 mg  total) by mouth every 12 (twelve) hours. Try to wean to 15 mg in the morning and 30 mg in evening 2 weeks; then see if possible to go to 15 mg twice day- -for chronic pain   morphine (MS CONTIN) 30 mg, Oral, Every 12 hours   naloxone (NARCAN) nasal spray 4 mg/0.1 mL Use as directed   ondansetron (ZOFRAN) 4 MG tablet TAKE 1 TABLET BY MOUTH EVERY 8 HOURS AS NEEDED FOR NAUSEA/VOMITING. CAN CAUSE CONSTIPATION- MONITOR   polyethylene glycol (MIRALAX / GLYCOLAX) 17 g, Oral, 2 times daily   promethazine (PHENERGAN) 12.5 mg, Oral, Every 6 hours PRN   rosuvastatin (CRESTOR) 20 mg, Oral, Daily   senna (SENOKOT) 25.8 mg, Oral, 2 times daily   simethicone (MYLICON) 80 mg, Oral, 4 times daily PRN   tadalafil (CIALIS) 10-20 mg, Oral, Every 48 hours PRN   tamsulosin (FLOMAX) 0.8 mg, Oral, Daily   tiZANidine (ZANAFLEX) 4 mg, Oral, 2 times daily   Zinc Sulfate 220 mg, Oral, Daily       Objective:   Physical Exam BP 124/76   Pulse 68   Temp 97.9 F (36.6 C) (Oral)   Resp 18   Ht '6\' 2"'  (1.88 m)   Wt 290 lb 8 oz (131.8 kg)   SpO2 96%   BMI 37.30 kg/m  General: Well developed, NAD, BMI noted Neck: No  thyromegaly  HEENT:  Normocephalic . Face symmetric, atraumatic Lungs:  CTA B Normal respiratory effort, no intercostal retractions, no accessory muscle use. Heart: RRR,  no murmur.  Abdomen:  Not distended, soft, non-tender. No rebound or rigidity.   Lower extremities: no pretibial edema bilaterally  DRE: Declined Skin: Exposed areas without rash. Not pale. Not jaundice Neurologic:  alert & oriented X3.  Speech normal, gait appropriate for age and unassisted Strength symmetric and appropriate for age.  Psych: Cognition and judgment appear intact.  Cooperative with normal attention span and concentration.  Behavior appropriate. No anxious or depressed appearing.     Assessment     Assessment DM NO neuropathy per [facility] 07-2020.  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 Hyperlipidemia: Continue Crestor, checking labs Morbid obesity: Significant weight loss in the last couple of years. Discitis, lumbar osteomyelitis: Making significant improvements, does not need a walker or cane.  On daily antibiotics indefinitely. OSA: Not using CPAP since November 2022.  Suspect weight loss has taken  care of the problem.  Reassess periodically. RTC 5 to 6 months (will need Tdap and Shingrix booster)

## 2022-01-03 ENCOUNTER — Encounter (HOSPITAL_BASED_OUTPATIENT_CLINIC_OR_DEPARTMENT_OTHER): Payer: No Typology Code available for payment source | Admitting: Internal Medicine

## 2022-01-03 DIAGNOSIS — E11622 Type 2 diabetes mellitus with other skin ulcer: Secondary | ICD-10-CM | POA: Diagnosis not present

## 2022-01-03 DIAGNOSIS — L98424 Non-pressure chronic ulcer of back with necrosis of bone: Secondary | ICD-10-CM | POA: Diagnosis not present

## 2022-01-03 NOTE — Progress Notes (Signed)
[name], [name] ([id number]) Visit Report for 01/03/2022 Arrival Information Details Patient Name: Date of Service: [name], [name] 01/03/2022 9:15 A M Medical Record Number: [id number] Patient Account Number: [id number] Date of Birth/Sex: Treating RN: 06/08/1965 (56 y.o. [name], [name] Primary Care Ramell Wacha: [name] Other Clinician: Referring Hula Tasso: Treating Jacaria Colburn/Extender: [name] Weeks in Treatment: 47 Visit Information History Since Last Visit Added or deleted any medications: No Patient Arrived: Ambulatory Any new allergies or adverse reactions: No Arrival Time: 10:00 Had a fall or experienced change in No Accompanied By: self activities of daily living that may affect Transfer Assistance: None risk of falls: Patient Identification Verified: Yes Signs or symptoms of abuse/neglect since last visito No Secondary Verification Process Completed: Yes Hospitalized since last visit: No Patient Requires Transmission-Based Precautions: No Implantable device outside of the clinic excluding No Patient Has Alerts: Yes cellular tissue based products placed in the center Patient Alerts: Patient on Blood Thinner since last visit: No BP Right Arm-PICC Has Dressing in Place as Prescribed: Yes Pain Present Now: No Electronic Signature(s) Signed: 01/03/2022 12:35:09 PM By: [name] RN Entered By: [name] on 01/03/2022 10:00:42 -------------------------------------------------------------------------------- Encounter Discharge Information Details Patient Name: Date of Service: Christian Grout. 01/03/2022 9:15 A M Medical Record Number: [id number] Patient Account Number: [id number] Date of Birth/Sex: Treating RN: 19-May-1965 (56 y.o. [name], [name] Primary Care Jagger Beahm: [name] Other Clinician: Referring Kyndel Egger: Treating Sharron Petruska/Extender: [name] Weeks in Treatment: [phone] Encounter Discharge Information Items Post  Procedure Vitals Discharge Condition: Stable Temperature (F): 98.7 Ambulatory Status: Ambulatory Pulse (bpm): 74 Discharge Destination: Home Respiratory Rate (breaths/min): 17 Transportation: Private Auto Blood Pressure (mmHg): 120/80 Accompanied By: self Schedule Follow-up Appointment: Yes Clinical Summary of Care: Patient Declined Electronic Signature(s) Signed: 01/03/2022 12:35:09 PM By: [name] RN Entered By: [name] on 01/03/2022 10:33:20 -------------------------------------------------------------------------------- Lower Extremity Assessment Details Patient Name: Date of Service: Christian Grout. 01/03/2022 9:15 A M Medical Record Number: [id number] Patient Account Number: [id number] Date of Birth/Sex: Treating RN: 15-Mar-1966 (56 y.o. [name] Primary Care Shaine Mount: [name] Other Clinician: Referring Misti Towle: Treating Jacquiline Zurcher/Extender: [name] Weeks in Treatment: 40 Electronic Signature(s) Signed: 01/03/2022 12:35:09 PM By: [name] RN Entered By: [name] on 01/03/2022 10:01:14 -------------------------------------------------------------------------------- Multi Wound Chart Details Patient Name: Date of Service: Christian Grout. 01/03/2022 9:15 A M Medical Record Number: [id number] Patient Account Number: [id number] Date of Birth/Sex: Treating RN: 1965-11-23 (56 y.o. [name], [place] Primary Care Jaree Dwight: [name] Other Clinician: Referring Christoper Bushey: Treating Jnai Snellgrove/Extender: [name] Weeks in Treatment: 40 Vital Signs Height(in): Pulse(bpm): 74 Weight(lbs): Blood Pressure(mmHg): 133/70 Body Mass Index(BMI): Temperature(F): 98.7 Respiratory Rate(breaths/min): 17 Photos: [N/A:N/A] Midline Back N/A N/A Wound Location: Surgical Injury N/A N/A Wounding Event: Open Surgical Wound N/A N/A Primary Etiology: Hypertension, Type II Diabetes, N/A N/A Comorbid  History: Osteomyelitis 01/11/2021 N/A N/A Date Acquired: 40 N/A N/A Weeks of Treatment: Open N/A N/A Wound Status: No N/A N/A Wound Recurrence: 4x0.4x0.3 N/A N/A Measurements L x W x D (cm) 1.257 N/A N/A A (cm) : rea 0.377 N/A N/A Volume (cm) : 98.30% N/A N/A % Reduction in A rea: 99.80% N/A N/A % Reduction in Volume: 12 Position 1 (o'clock): 0.3 Maximum Distance 1 (cm): Yes N/A N/A Tunneling: Full Thickness With Exposed Support N/A N/A Classification: Structures Medium N/A N/A Exudate Amount: Serosanguineous N/A N/A Exudate Type: red, brown N/A N/A Exudate Color: Distinct, outline attached N/A N/A Wound  Margin: Large (67-100%) N/A N/A Granulation Amount: Red N/A N/A Granulation Quality: Small (1-33%) N/A N/A Necrotic Amount: Fat Layer (Subcutaneous Tissue): Yes N/A N/A Exposed Structures: Fascia: No Tendon: No Muscle: No Joint: No Bone: No Large (67-100%) N/A N/A Epithelialization: Debridement - Excisional N/A N/A Debridement: Pre-procedure Verification/Time Out 10:19 N/A N/A Taken: Lidocaine N/A N/A Pain Control: Subcutaneous, Slough N/A N/A Tissue Debrided: Skin/Subcutaneous Tissue N/A N/A Level: 1.6 N/A N/A Debridement A (sq cm): rea Curette N/A N/A Instrument: Minimum N/A N/A Bleeding: Pressure N/A N/A Hemostasis A chieved: 0 N/A N/A Procedural Pain: 0 N/A N/A Post Procedural Pain: Procedure was tolerated well N/A N/A Debridement Treatment Response: 4x0.4x0.3 N/A N/A Post Debridement Measurements L x W x D (cm) 0.377 N/A N/A Post Debridement Volume: (cm) Cellular or Tissue Based Product N/A N/A Procedures Performed: Debridement Treatment Notes Electronic Signature(s) Signed: 01/03/2022 10:28:42 AM By: Kalman Shan DO Signed: 01/03/2022 12:35:09 PM By: [name] RN Entered By: Kalman Shan on 01/03/2022  10:23:13 -------------------------------------------------------------------------------- Multi-Disciplinary Care Plan Details Patient Name: Date of Service: Christian Brown E. 01/03/2022 9:15 A M Medical Record Number: [id number] Patient Account Number: [id number] Date of Birth/Sex: Treating RN: [birth year] (56 y.o. [name], [name] Primary Care Joeanne Robicheaux: [name] Other Clinician: Referring Pascuala Klutts: Treating Eloy Fehl/Extender: [name] Weeks in Treatment: [place] reviewed with physician Active Inactive Wound/Skin Impairment Nursing Diagnoses: Impaired tissue integrity Goals: Patient/caregiver will verbalize understanding of skin care regimen Date Initiated: 03/28/2021 Target Resolution Date: 01/11/2022 Goal Status: Active Ulcer/skin breakdown will have a volume reduction of 30% by week 4 Date Initiated: 03/28/2021 Date Inactivated: 05/27/2021 Target Resolution Date: 05/31/2021 Goal Status: Met Interventions: Assess patient/caregiver ability to obtain necessary supplies Assess patient/caregiver ability to perform ulcer/skin care regimen upon admission and as needed Assess ulceration(s) every visit Provide education on ulcer and skin care Treatment Activities: Topical wound management initiated : 03/28/2021 Notes: Electronic Signature(s) Signed: 01/03/2022 12:35:09 PM By: [name] RN Entered By: [name] on 01/03/2022 10:08:59 -------------------------------------------------------------------------------- Pain Assessment Details Patient Name: Date of Service: Christian Grout. 01/03/2022 9:15 A M Medical Record Number: [id number] Patient Account Number: [id number] Date of Birth/Sex: Treating RN: November 12, 1965 (56 y.o. [name] Primary Care [place]: [name] Other Clinician: Referring Eryck Negron: Treating Jeriko Kowalke/Extender: [name] Weeks in Treatment: 40 Active Problems Location of  Pain Severity and Description of Pain Patient Has Paino No Site Locations Pain Management and Medication Current Pain Management: Electronic Signature(s) Signed: 01/03/2022 12:35:09 PM By: [name] RN Entered By: [name] on 01/03/2022 10:01:08 -------------------------------------------------------------------------------- Patient/Caregiver Education Details Patient Name: Date of Service: Christian Grout 9/22/2023andnbsp9:15 [place] Record Number: [id number] Patient Account Number: [id number] Date of Birth/Gender: Treating RN: 13-Sep-1965 (56 y.o. [name] Primary Care Physician: [name] Other Clinician: Referring Physician: Treating Physician/Extender: [name] in Treatment: 74 Education Assessment Education Provided To: Patient Education Topics Provided Wound/Skin Impairment: Methods: Explain/Verbal Responses: Reinforcements needed, State content correctly [employer]) Signed: 01/03/2022 12:35:09 PM By: [name] RN Entered By: [name] on 01/03/2022 10:09:15 -------------------------------------------------------------------------------- Wound Assessment Details Patient Name: Date of Service: Christian Grout. 01/03/2022 9:15 A M Medical Record Number: [id number] Patient Account Number: [id number] Date of Birth/Sex: Treating RN: December 27, 1965 (56 y.o. [name] Primary Care Kortney Potvin: [name] Other Clinician: Referring Jolea Dolle: Treating Sharanya Templin/Extender: [name] Weeks in Treatment: 40 Wound Status Wound Number: 1 Primary Etiology: Open Surgical Wound Wound Location: Midline Back Wound Status:  Open Wounding Event: Surgical Injury Comorbid History: Hypertension, Type II Diabetes, Osteomyelitis Date Acquired: 01/11/2021 Weeks Of Treatment: 40 Clustered Wound: No Photos Wound Measurements Length: (cm) 4 Width: (cm) 0.4 Depth: (cm) 0.3 Area: (cm)  1.257 Volume: (cm) 0.377 % Reduction in Area: 98.3% % Reduction in Volume: 99.8% Epithelialization: Large (67-100%) Tunneling: Yes Position (o'clock): 12 Maximum Distance: (cm) 0.3 Undermining: No Wound Description Classification: Full Thickness With Exposed Support Structures Wound Margin: Distinct, outline attached Exudate Amount: Medium Exudate Type: Serosanguineous Exudate Color: red, brown Foul Odor After Cleansing: No Slough/Fibrino Yes Wound Bed Granulation Amount: Large (67-100%) Exposed Structure Granulation Quality: Red Fascia Exposed: No Necrotic Amount: Small (1-33%) Fat Layer (Subcutaneous Tissue) Exposed: Yes Necrotic Quality: Adherent Slough Tendon Exposed: No Muscle Exposed: No Joint Exposed: No Bone Exposed: No Treatment Notes Wound #1 (Back) Wound Laterality: Midline Cleanser Peri-Wound Care Skin Prep Discharge Instruction: Use skin prep as directed Topical Primary Dressing Epicord Discharge Instruction: applied by [name] directly to wound bed. Secondary Dressing ADAPTIC TOUCH 3x4.25 in Discharge Instruction: Applied over primary dressing secured with steri-strips as directed. Woven Gauze Sponge, Non-Sterile 4x4 in Discharge Instruction: Apply over primary dressing as directed. Zetuvit Plus Silicone Border Dressing 7x7(in/in) Discharge Instruction: Apply silicone border over primary dressing as directed. Secured With Compression Wrap Compression Stockings Environmental education officer) Signed: 01/03/2022 12:35:09 PM By: [name] RN Signed: 01/03/2022 2:51:11 PM By: Deon Pilling RN, BSN Entered By: Deon Pilling on 01/03/2022 10:05:49 -------------------------------------------------------------------------------- Vitals Details Patient Name: Date of Service: Christian Brown E. 01/03/2022 9:15 A M Medical Record Number: [id number] Patient Account Number: [id number] Date of Birth/Sex: Treating RN: 07/22/1965 (56 y.o. [name] Primary Care Alarik Radu: [name] Other Clinician: Referring Dontai Pember: Treating Aleea Hendry/Extender: [name] Weeks in Treatment: 40 Vital Signs Time Taken: 10:00 Temperature (F): 98.7 Pulse (bpm): 74 Respiratory Rate (breaths/min): 17 Blood Pressure (mmHg): 133/70 Reference Range: 80 - 120 mg / dl Electronic Signature(s) Signed: 01/03/2022 12:35:09 PM By: [name] RN Entered By: [name] on 01/03/2022 10:01:02

## 2022-01-03 NOTE — Progress Notes (Signed)
Christian, Murphy ([id number]) Visit Report for 01/03/2022 Chief Complaint Document Details Patient Name: Date of Service: [name], [name] 01/03/2022 9:15 A M Medical Record Number: [id number] Patient Account Number: [id number] Date of Birth/Sex: Treating RN: [birth year] (56 y.o. [name] Primary Care Provider: [name] Other Clinician: Referring Provider: Treating Provider/Extender: [name] Weeks in Treatment: 40 Information Obtained from: Patient Chief Complaint 03/28/2021; Back wound Electronic Signature(s) Signed: 01/03/2022 10:28:42 AM By: Kalman Shan DO Entered By: Kalman Shan on 01/03/2022 10:23:22 -------------------------------------------------------------------------------- Cellular or Tissue Based Product Details Patient Name: Date of Service: Christian, Murphy 01/03/2022 9:15 A M Medical Record Number: [id number] Patient Account Number: [id number] Date of Birth/Sex: Treating RN: 06-15-1965 (56 y.o. [name], [name] Primary Care Provider: [name] Other Clinician: Referring Provider: Treating Provider/Extender: [name] Weeks in Treatment: 51 Cellular or Tissue Based Product Type Wound #1 Midline Back Applied to: Performed By: Physician Kalman Shan, DO Cellular or Tissue Based Product Type: Epicord Level of Consciousness (Pre-procedure): Awake and Alert Pre-procedure Verification/Time Out Yes - 10:20 Taken: Location: trunk / arms / legs Wound Size (sq cm): 1.6 Product Size (sq cm): 6 Waste Size (sq cm): 0 Amount of Product Applied (sq cm): 6 Instrument Used: Forceps, Scissors Lot #: [id number] Expiration Date: 08/13/2026 Fenestrated: No Reconstituted: Yes Solution Type: normal saline Solution Amount: 39m Lot #: [id number] Expiration Date: 01/12/2022 Secured: Yes Secured With: Steri-Strips Dressing Applied: Yes Primary Dressing: Adaptic, gauze Procedural Pain: 0 Post Procedural Pain:  0 Response to Treatment: Procedure was tolerated well Level of Consciousness (Post- Awake and Alert procedure): Post Procedure Diagnosis Same as Pre-procedure Electronic Signature(s) Signed: 01/03/2022 10:28:42 AM By: HKalman ShanDO Signed: 01/03/2022 12:35:09 PM By: BRhae HammockRN Entered By: BRhae Hammockon 01/03/2022 10:23:02 -------------------------------------------------------------------------------- Debridement Details Patient Name: Date of Service: Christian BrownE. 01/03/2022 9:15 A M Medical Record Number: [id number] Account Number: [id number] of Birth/Sex: Treating RN: [birth year](56y.o. MBurnadette Pop [name] Primary Care Provider: PKathlene NovemberOther Clinician: Referring Provider: Treating Provider/Extender: HFreddi StarrWeeks in Treatment: 40 Debridement Performed for Assessment: Wound #1 Midline Back Performed By: Physician HKalman Shan DO Debridement Type: Debridement Level of Consciousness (Pre-procedure): Awake and Alert Pre-procedure Verification/Time Out Yes - 10:19 Taken: Start Time: 10:19 Pain Control: Lidocaine T Area Debrided (L x W): otal 4 (cm) x 0.4 (cm) = 1.6 (cm) Tissue and other material debrided: Viable, Non-Viable, Slough, Subcutaneous, Slough Level: Skin/Subcutaneous Tissue Debridement Description: Excisional Instrument: Curette Bleeding: Minimum Hemostasis Achieved: Pressure End Time: 10:19 Procedural Pain: 0 Post Procedural Pain: 0 Response to Treatment: Procedure was tolerated well Level of Consciousness (Post- Awake and Alert procedure): Post Debridement Measurements of Total Wound Length: (cm) 4 Width: (cm) 0.4 Depth: (cm) 0.3 Volume: (cm) 0.377 Character of Wound/Ulcer Post Debridement: Improved Post Procedure Diagnosis Same as Pre-procedure Electronic Signature(s) Signed: 01/03/2022 10:28:42 AM By: HKalman ShanDO Signed: 01/03/2022 12:35:09 PM By: BRhae HammockRN Entered  By: BRhae Hammockon 01/03/2022 10:20:18 -------------------------------------------------------------------------------- HPI Details Patient Name: Date of Service: Christian BrownE. 01/03/2022 9:15 A M Medical Record Number: [id number] Account Number: [id number] of Birth/Sex: Treating RN: [birth year](56y.o. MErie NoePrimary Care Provider: PKathlene NovemberOther Clinician: Referring Provider: Treating Provider/Extender: HFreddi StarrWeeks in Treatment: 40 History of Present Illness HPI Description: Admission 03/28/2021 Mr. CMckinley Olheiseris a 56year old male with a past medical history of controlled type 2 diabetes on oral agents,  obesity and OSA that presents to the clinic for a back wound. On 01/11/2021 patient had a laminectomy with PLIF of L1-S1 by Dr. Venetia Constable because of lumbar stenosis and radiculopathy. He subsequently developed bacteremia. He had CT imaging on 10/13 of the lumbar spine that showed fluid collection in the soft tissue of the posterior L1 and S1 and was taken to the OR for washout on 10/14. 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 He received 4 weeks of IV antibiotics by infectious disease. After his completion of 4 weeks of IV antibiotics he was continued for an additional 4 weeks of IV cefazolin with a stop date of 12/29. He has been evaluated by plastic surgery and no plans for surgical intervention at this time. Wife is present and reports he has been on the wound VAC for the past 8 weeks with improvement in wound healing. He currently denies systemic signs of infection. 12/22; patient presents for follow-up. He reports no issues since last clinic visit. He denies signs of infection. He has been tolerating the wound VAC well. 12/30; patient presents for follow-up. He reports no issues and has no complaints today. He has been tolerating the wound VAC well. 1/9; patient presents for follow-up. He has no  issues or complaints today. He states he feels well. He has had no problems with the wound VAC. 1/16; patient presents for follow-up. He continues to use the wound VAC with no issues. He denies signs of infection. 1/23; patient presents for follow-up. He has been switched from IV cefazolin to oral cefadroxil by infectious disease. He has no issues or complaints today. He denies signs of infection. He continues to tolerate the wound VAC well. 1/30; patient presents for follow-up. He continues to tolerate the wound VAC well. 2/6; patient presents for follow-up. He has no issues or complaints today. He continues to tolerate the wound VAC well. He denies signs of infection. 2/13; patient presents for follow-up. He continues to do well with the wound VAC. He denies any issues. 2/27; patient presents for follow-up. He continues to use the wound VAC without any issues. He denies signs of infection. 3/20; patient presents for follow-up. He has no issues or complaints today. He continues to use the wound VAC. 4/3; patient presents for follow-up. He continues to use the wound VAC without issues. He denies signs of infection. 4/17; 2-week follow-up. He continues to do well. His measurements are improved. Initially a surgical wound complicated by infection. 5/1; patient presents for follow-up. He has no issues or complaints today. He continues to tolerate the wound VAC well. He denies signs of infection. 5/15; patient presents for follow-up. He has noted more maceration to the periwound. He has been using the wound VAC without issues. He currently denies signs of infection. 5/30; patient presents for follow-up. He has been tolerating the wound VAC well over the past 2 weeks. He no longer has maceration to the periwound. He has no issues or complaints today. 6/8; this patient with a postsurgical wound that was complicated by postop infection. He has been using silver collagen under wound VAC and gradually  doing well improvement in dimensions especially the tunnel at 12:00 6/22; patient presents for follow-up. We have been using silver collagen under the wound VAC. Patient has no issues or questions today. 7/6; patient presents for follow-up. Patient continues to use collagen under the wound VAC with no issues. 7/20; patient presents for follow-up. He has been using collagen under the  wound VAC with no issues. He has been approved for Epicord. This was discussed with the patient and he is in agreement to having this placed at next clinic visit. 7/27; patient presents for follow-up. He has been tolerating the wound VAC well with collagen underneath. He has been approved for epi cord and we have this to place in office today. 8/4; patient presents for follow-up. He had Epicort placed at last clinic visit. We held off on the wound VAC. He tolerated this well. He has no issues or complaints today. 8/11; patient presents for follow-up. Epicord was placed at last clinic visit. He had the wound VAC on for the past week. He states that no drainage was suctioned into the canister. He has slight maceration to the periwound. 8/18; patient presents for follow-up. Epicord #3 was placed in standard fashion last week. The wound VAC was held. He has no issues or complaints today. 8/25; Patient presents for follow-up. Epicord #4 was placed in standard fashion last week. The wound VAC was held again. He has no issues or complaints today. There has been improvement in wound healing. 9/1; patient presents for follow-up. Epicort #5 was placed in standard fashion last week. He has no issues or complaints today. 9/8; patient presents for follow-up. Epicord #6 was placed in standard fashion last week. He tolerated this well and has no issues or complaints today. 9/15; Epicort No. 7 the wound looks healthy. Perhaps some hyper granulated tissue in the lower 50% of the longitudinal wound 9/22; Epicord #8 placed at last clinic  visit. Wound appears well-healing. Patient has no issues or complaints today. Electronic Signature(s) Signed: 01/03/2022 10:28:42 AM By: Kalman Shan DO Entered By: Kalman Shan on 01/03/2022 10:26:48 -------------------------------------------------------------------------------- Physical Exam Details Patient Name: Date of Service: [name] 01/03/2022 9:15 A M Medical Record Number: [id number] Patient Account Number: [id number] Date of Birth/Sex: Treating RN: Aug 10, 1965 (56 y.o. [name] Primary Care Provider: [name] Other Clinician: Referring Provider: Treating Provider/Extender: [name] Weeks in Treatment: 40 Constitutional respirations regular, non-labored and within target range for patient.Marland Kitchen Psychiatric pleasant and cooperative. Notes T the lumbar spine over an incision site there is a previous surgical wound with tunneling at the 12 o'clock position. Granulation tissue and nonviable tissue o present. No surrounding signs of infection. Electronic Signature(s) Signed: 01/03/2022 10:28:42 AM By: Kalman Shan DO Entered By: Kalman Shan on 01/03/2022 10:25:26 -------------------------------------------------------------------------------- Physician Orders Details Patient Name: Date of Service: [name] 01/03/2022 9:15 A M Medical Record Number: [id number] Patient Account Number: [id number] Date of Birth/Sex: Treating RN: [birth year] (56 y.o. [name] Primary Care Provider: [name] Other Clinician: Referring Provider: Treating Provider/Extender: [name] Weeks in Treatment: [phone] Verbal / Phone Orders: No Diagnosis Coding Follow-up Appointments ppointment in 1 week. - Friday Dr. Heber [place] and Allayne Butcher, Rm # 9 Return A Anesthetic (In clinic) Topical Lidocaine 5% applied to wound bed Cellular or Tissue Based Products Cellular or Tissue Based Product Type: - Epicord #3 applied  11/22/21 Epicord #4 11/29/21 Epicord #5 12/06/2021 Epicord #6 12/13/21 Epicord # 7 12/20/21 Epicord # 8 12/27/21 Epicord # 9 01/03/22 daptic or Mepitel. (DO NOT REMOVE). - leave Cellular or Tissue Based Product applied to wound bed, secured with steri-strips, cover with A the adaptic and steri-strips in place. Bathing/ Shower/ Hygiene May shower with protection but do not get wound dressing(s) wet. Negative Presssure Wound Therapy Discontinue wound vac - Patient to call [facility] to pick up  wound vac. Additional Orders / Instructions Follow Nutritious Diet - Continue to monitor blood sugars daily Wound Treatment Wound #1 - Back Wound Laterality: Midline Peri-Wound Care: Skin Prep Every Other Day/30 Days Discharge Instructions: Use skin prep as directed Prim Dressing: Epicord Every Other Day/30 Days ary Discharge Instructions: applied by provider directly to wound bed. Secondary Dressing: ADAPTIC TOUCH 3x4.25 in Every Other Day/30 Days Discharge Instructions: Applied over primary dressing secured with steri-strips as directed. Secondary Dressing: Woven Gauze Sponge, Non-Sterile 4x4 in Every Other Day/30 Days Discharge Instructions: Apply over primary dressing as directed. Secondary Dressing: Zetuvit Plus Silicone Border Dressing 7x7(in/in) (Generic) Every Other Day/30 Days Discharge Instructions: Apply silicone border over primary dressing as directed. Electronic Signature(s) Signed: 01/03/2022 10:28:42 AM By: Kalman Shan DO Entered By: Kalman Shan on 01/03/2022 10:25:31 -------------------------------------------------------------------------------- Problem List Details Patient Name: Date of Service: [name] 01/03/2022 9:15 A M Medical Record Number: [id number] Patient Account Number: [id number] Date of Birth/Sex: Treating RN: 1965/12/10 (56 y.o. 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 01/03/2022 9:15 A M Medical Record Number: [id number] Patient Account Number: [id number] Date of Birth/Sex: Treating RN: [birth year] (56 y.o. [name] Primary Care Provider: [name] Other Clinician: Referring Provider: Treating Provider/Extender: [name] Weeks in Treatment: 48 Subjective Chief Complaint Information obtained from Patient 03/28/2021; Back wound History of Present Illness (HPI) Admission 03/28/2021 Mr. Phat Dalton is a 56 year old male with a past medical history of controlled type 2 diabetes on oral agents, obesity and OSA that presents to the clinic for a back wound. On 01/11/2021 patient had a laminectomy with PLIF of L1-S1 by Dr. Venetia Constable because of lumbar stenosis and radiculopathy. He subsequently developed bacteremia. He had CT imaging on 10/13 of the lumbar spine that showed fluid collection in the soft tissue of the posterior L1 and S1 and was taken to the OR for washout on 10/14. 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 He received 4 weeks of IV antibiotics by infectious disease. After his completion of 4 weeks of IV antibiotics he was continued for an additional 4 weeks of IV cefazolin with a stop date of 12/29. He has been evaluated by plastic surgery and no  plans for surgical intervention at this time. Wife is present and reports he has been on the wound VAC for the past 8 weeks with improvement in wound healing. He currently denies systemic signs of infection. 12/22; patient presents for follow-up. He reports no issues since last clinic visit. He denies signs of infection. He has been tolerating the wound VAC well. 12/30; patient presents for follow-up. He reports no issues and has no complaints today. He has been tolerating the wound VAC well. 1/9; patient presents for follow-up. He has no issues or complaints today. He states he feels well. He has had no problems with the wound VAC. 1/16; patient presents for follow-up. He continues to use the wound VAC with no issues.  He denies signs of infection. 1/23; patient presents for follow-up. He has been switched from IV cefazolin to oral cefadroxil by infectious disease. He has no issues or complaints today. He denies signs of infection. He continues to tolerate the wound VAC well. 1/30; patient presents for follow-up. He continues to tolerate the wound VAC well. 2/6; patient presents for follow-up. He has no issues or complaints today. He continues to tolerate the wound VAC well. He denies signs of infection. 2/13; patient presents for follow-up. He continues to do well with the wound VAC. He denies any issues. 2/27; patient presents for follow-up. He continues to use the wound VAC without any issues. He denies signs of infection. 3/20; patient presents for follow-up. He has no issues or complaints today. He continues to use the wound VAC. 4/3; patient presents for follow-up. He continues to use the wound VAC without issues. He denies signs of infection. 4/17; 2-week follow-up. He continues to do well. His measurements are improved. Initially a surgical wound complicated by infection. 5/1; patient presents for follow-up. He has no issues or complaints today. He continues to tolerate the wound VAC well. He  denies signs of infection. 5/15; patient presents for follow-up. He has noted more maceration to the periwound. He has been using the wound VAC without issues. He currently denies signs of infection. 5/30; patient presents for follow-up. He has been tolerating the wound VAC well over the past 2 weeks. He no longer has maceration to the periwound. He has no issues or complaints today. 6/8; this patient with a postsurgical wound that was complicated by postop infection. He has been using silver collagen under wound VAC and gradually doing well improvement in dimensions especially the tunnel at 12:00 6/22; patient presents for follow-up. We have been using silver collagen under the wound VAC. Patient has no issues or questions today. 7/6; patient presents for follow-up. Patient continues to use collagen under the wound VAC with no issues. 7/20; patient presents for follow-up. He has been using collagen under the wound VAC with no issues. He has been approved for Epicord. This was discussed with the patient and he is in agreement to having this placed at next clinic visit. 7/27; patient presents for follow-up. He has been tolerating the wound VAC well with collagen underneath. He has been approved for epi cord and we have this to place in office today. 8/4; patient presents for follow-up. He had Epicort placed at last clinic visit. We held off on the wound VAC. He tolerated this well. He has no issues or complaints today. 8/11; patient presents for follow-up. Epicord was placed at last clinic visit. He had the wound VAC on for the past week. He states that no drainage was suctioned into the canister. He has slight maceration to the periwound. 8/18; patient presents for follow-up. Epicord #3 was placed in standard fashion last week. The wound VAC was held. He has no issues or complaints today. 8/25; Patient presents for follow-up. Epicord #4 was placed in standard fashion last week. The wound VAC was  held again. He has no issues or complaints today. There has been improvement in wound healing. 9/1; patient presents for follow-up. Epicort #5 was placed in standard fashion last week. He has no issues or complaints today. 9/8; patient presents for follow-up. Epicord #6 was placed in standard fashion last week. He tolerated this well and has no issues or complaints today. 9/15; Epicort No. 7 the wound looks healthy. Perhaps some hyper granulated tissue  in the lower 50% of the longitudinal wound 9/22; Epicord #8 placed at last clinic visit. Wound appears well-healing. Patient has no issues or complaints today. Patient History Information obtained from Patient. Family History Diabetes - Mother, Stroke - Siblings, No family history of Cancer, Heart Disease, Hereditary Spherocytosis, Hypertension, Kidney Disease, Lung Disease, Seizures, Thyroid Problems, Tuberculosis. Social History Never smoker, Marital Status - Married, Alcohol Use - Rarely, Drug Use - No History, Caffeine Use - Rarely. Medical History Cardiovascular Patient has history of Hypertension Endocrine Patient has history of Type II Diabetes Musculoskeletal Patient has history of Osteomyelitis Medical A Surgical History Notes nd Musculoskeletal DDD Objective Constitutional respirations regular, non-labored and within target range for patient.. Vitals Time Taken: 10:00 AM, Temperature: 98.7 F, Pulse: 74 bpm, Respiratory Rate: 17 breaths/min, Blood Pressure: 133/70 mmHg. Psychiatric pleasant and cooperative. General Notes: T the lumbar spine over an incision site there is a previous surgical wound with tunneling at the 12 o'clock position. Granulation tissue and o nonviable tissue present. No surrounding signs of infection. Integumentary (Hair, Skin) Wound #1 status is Open. Original cause of wound was Surgical Injury. The date acquired was: 01/11/2021. The wound has been in treatment 40 weeks. The wound is located on the  Midline Back. The wound measures 4cm length x 0.4cm width x 0.3cm depth; 1.257cm^2 area and 0.377cm^3 volume. There is Fat Layer (Subcutaneous Tissue) exposed. There is no undermining noted, however, there is tunneling at 12:00 with a maximum distance of 0.3cm. There is a medium amount of serosanguineous drainage noted. The wound margin is distinct with the outline attached to the wound base. There is large (67-100%) red granulation within the wound bed. There is a small (1-33%) amount of necrotic tissue within the wound bed including Adherent Slough. 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 No signs of infection. I debrided nonviable tissue. Epicord #9 was placed in standard fashion today. Procedures Wound #1 Pre-procedure diagnosis of Wound #1 is an Open Surgical Wound located on the Midline Back . There was a Excisional Skin/Subcutaneous Tissue Debridement with a total area of 1.6 sq cm performed by Kalman Shan, DO. With the following instrument(s): Curette to remove Viable and Non-Viable tissue/material. Material removed includes Subcutaneous Tissue and Slough and after achieving pain control using Lidocaine. No specimens were taken. A time out was conducted at 10:19, prior to the start of the procedure. A Minimum amount of bleeding was controlled with Pressure. The procedure was tolerated well with a pain level of 0 throughout and a pain level of 0 following the procedure. Post Debridement Measurements: 4cm length x 0.4cm width x 0.3cm depth; 0.377cm^3 volume. Character of Wound/Ulcer Post Debridement is improved. Post procedure Diagnosis Wound #1: Same as Pre-Procedure Pre-procedure diagnosis of Wound #1 is an Open Surgical Wound located on the Midline Back. A skin graft  procedure using a bioengineered skin substitute/cellular or tissue based product was performed by Kalman Shan, DO with the following instrument(s): Forceps and Scissors. Epicord was applied and secured with Steri-Strips. 6 sq cm of product was utilized and 0 sq cm was wasted. Post Application, Adaptic, gauze was applied. A Time Out was conducted at 10:20, prior to the start of the procedure. The procedure was tolerated well with a pain level of 0 throughout and a pain level of 0 following the procedure. Post procedure  Diagnosis Wound #1: Same as Pre-Procedure . Plan Follow-up Appointments: Return Appointment in 1 week. - Friday Dr. Heber [place] and Allayne Butcher, Rm # 9 Anesthetic: (In clinic) Topical Lidocaine 5% applied to wound bed Cellular or Tissue Based Products: Cellular or Tissue Based Product Type: - Epicord #3 applied 11/22/21 Epicord #4 11/29/21 Epicord #5 12/06/2021 Epicord #6 12/13/21 Epicord # 7 12/20/21 Epicord # 8 12/27/21 Epicord # 9 01/03/22 Cellular or Tissue Based Product applied to wound bed, secured with steri-strips, cover with Adaptic or Mepitel. (DO NOT REMOVE). - leave the adaptic and steri-strips in place. Bathing/ Shower/ Hygiene: May shower with protection but do not get wound dressing(s) wet. Negative Presssure Wound Therapy: Discontinue wound vac - Patient to call [facility] to pick up wound vac. Additional Orders / Instructions: Follow Nutritious Diet - Continue to monitor blood sugars daily WOUND #1: - Back Wound Laterality: Midline Peri-Wound Care: Skin Prep Every Other Day/30 Days Discharge Instructions: Use skin prep as directed Prim Dressing: Epicord Every Other Day/30 Days ary Discharge Instructions: applied by provider directly to wound bed. Secondary Dressing: ADAPTIC TOUCH 3x4.25 in Every Other Day/30 Days Discharge Instructions: Applied over primary dressing secured with steri-strips as directed. Secondary Dressing: Woven Gauze Sponge, Non-Sterile 4x4 in Every  Other Day/30 Days Discharge Instructions: Apply over primary dressing as directed. Secondary Dressing: Zetuvit Plus Silicone Border Dressing 7x7(in/in) (Generic) Every Other Day/30 Days Discharge Instructions: Apply silicone border over primary dressing as directed. 1. In office sharp debridement 2. Epicord #10 was placed in standard fashion 3. Follow-up in 1 week Electronic Signature(s) Signed: 01/03/2022 10:28:42 AM By: Kalman Shan DO Entered By: Kalman Shan on 01/03/2022 10:27:51 -------------------------------------------------------------------------------- HxROS Details Patient Name: Date of Service: [name] 01/03/2022 9:15 A M Medical Record Number: [id number] Patient Account Number: [id number] Date of Birth/Sex: Treating RN: [birth year] (56 y.o. [name], [name] Primary Care Provider: [name] Other Clinician: Referring Provider: Treating Provider/Extender: [name] Weeks in Treatment: 61 Information Obtained From Patient Cardiovascular Medical History: Positive for: Hypertension Endocrine Medical History: Positive for: Type II Diabetes Time with diabetes: Since mid 90's Treated with: Oral agents Blood sugar tested every day: Yes Tested : 2x day Musculoskeletal Medical History: Positive for: Osteomyelitis Past Medical History Notes: DDD Immunizations Pneumococcal Vaccine: Received Pneumococcal Vaccination: Yes Received Pneumococcal Vaccination On or After 60th Birthday: No Implantable Devices Yes Family and Social History Cancer: No; Diabetes: Yes - Mother; Heart Disease: No; Hereditary Spherocytosis: No; Hypertension: No; Kidney Disease: No; Lung Disease: No; Seizures: No; Stroke: Yes - Siblings; Thyroid Problems: No; Tuberculosis: No; Never smoker; Marital Status - Married; Alcohol Use: Rarely; Drug Use: No History; Caffeine Use: Rarely; Financial Concerns: No; Food, Clothing or Shelter Needs: No; Support System Lacking:  No; Transportation Concerns: No Electronic Signature(s) Signed: 01/03/2022 10:28:42 AM By: Kalman Shan DO Signed: 01/03/2022 12:35:09 PM By: [name] RN Entered By: Kalman Shan on 01/03/2022 10:26:52 -------------------------------------------------------------------------------- SuperBill Details Patient Name: Date of Service: [name] 01/03/2022 Medical Record Number: [id number] Patient Account Number: [id number] Date of Birth/Sex: Treating RN: 02/11/1966 (56 y.o. 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

## 2022-01-06 ENCOUNTER — Encounter: Payer: Self-pay | Admitting: Internal Medicine

## 2022-01-09 ENCOUNTER — Encounter (HOSPITAL_BASED_OUTPATIENT_CLINIC_OR_DEPARTMENT_OTHER): Payer: No Typology Code available for payment source | Admitting: Internal Medicine

## 2022-01-09 DIAGNOSIS — E11622 Type 2 diabetes mellitus with other skin ulcer: Secondary | ICD-10-CM | POA: Diagnosis not present

## 2022-01-09 NOTE — Progress Notes (Signed)
[name] ([id number]) Visit Report for 01/09/2022 Arrival Information Details Patient Name: Date of Service: [name] 01/09/2022 9:15 A M Medical Record Number: [id number] Patient Account Number: [id number] Date of Birth/Sex: Treating RN: 07-25-1965 (56 y.o. [name], [name] Primary Care Salem Mastrogiovanni: [name] Other Clinician: Referring Kenyon Eshleman: Treating Demri Poulton/Extender: [name] in Treatment: 76 Visit Information History Since Last Visit Added or deleted any medications: No Patient Arrived: Ambulatory Any new allergies or adverse reactions: No Arrival Time: 09:06 Had a fall or experienced change in No Accompanied By: self activities of daily living that may affect Transfer Assistance: None risk of falls: Patient Identification Verified: Yes Signs or symptoms of abuse/neglect since last visito No Secondary Verification Process Completed: Yes Hospitalized since last visit: No Patient Requires Transmission-Based Precautions: No Implantable device outside of the clinic excluding No Patient Has Alerts: Yes cellular tissue based products placed in the center Patient Alerts: Patient on Blood Thinner since last visit: No BP Right Arm-PICC Has Dressing in Place as Prescribed: Yes Pain Present Now: No Electronic Signature(s) Signed: 01/09/2022 4:22:00 PM By: Erenest Blank Entered By: Erenest Blank on 01/09/2022 09:06:34 -------------------------------------------------------------------------------- Encounter Discharge Information Details Patient Name: Date of Service: Christian Brown E. 01/09/2022 9:15 A M Medical Record Number: [id number] Patient Account Number: [id number] Date of Birth/Sex: Treating RN: 1965-12-30 (56 y.o. [name] Primary Care Kadejah Sandiford: [name] Other Clinician: Referring Caidyn Henricksen: Treating Madinah Quarry/Extender: [name] in Treatment: [phone] Encounter Discharge Information Items Post Procedure  Vitals Discharge Condition: Stable Temperature (F): 98.7 Ambulatory Status: Ambulatory Pulse (bpm): 74 Discharge Destination: Home Respiratory Rate (breaths/min): 17 Transportation: Private Auto Blood Pressure (mmHg): 134/74 Accompanied By: self Schedule Follow-up Appointment: Yes Clinical Summary of Care: Patient Declined Electronic Signature(s) Signed: 01/09/2022 4:19:15 PM By: [name] RN Entered By: [name] on 01/09/2022 10:15:22 -------------------------------------------------------------------------------- Lower Extremity Assessment Details Patient Name: Date of Service: Christian Grout. 01/09/2022 9:15 A M Medical Record Number: [id number] Patient Account Number: [id number] Date of Birth/Sex: Treating RN: 07-May-1965 (56 y.o. [name] Primary Care Moneisha Vosler: [name] Other Clinician: Referring Hugo Lybrand: Treating Jazilyn Siegenthaler/Extender: [name] in Treatment: 41 Electronic Signature(s) Signed: 01/09/2022 4:19:15 PM By: [name] RN Signed: 01/09/2022 4:22:00 PM By: Erenest Blank Entered By: Erenest Blank on 01/09/2022 09:07:11 -------------------------------------------------------------------------------- Multi Wound Chart Details Patient Name: Date of Service: Christian Brown E. 01/09/2022 9:15 A M Medical Record Number: [id number] Patient Account Number: [id number] Date of Birth/Sex: Treating RN: Mar 16, 1966 (56 y.o. [name], [place] Primary Care Rosette Bellavance: [name] Other Clinician: Referring Claramae Rigdon: Treating Obie Silos/Extender: [name] in Treatment: 41 Vital Signs Height(in): Pulse(bpm): 88 Weight(lbs): Blood Pressure(mmHg): 145/78 Body Mass Index(BMI): Temperature(F): 97.9 Respiratory Rate(breaths/min): 18 Photos: [N/A:N/A] Midline Back N/A N/A Wound Location: Surgical Injury N/A N/A Wounding Event: Open Surgical Wound N/A N/A Primary Etiology: Hypertension, Type II  Diabetes, N/A N/A Comorbid History: Osteomyelitis 01/11/2021 N/A N/A Date Acquired: 67 N/A N/A Weeks of Treatment: Open N/A N/A Wound Status: No N/A N/A Wound Recurrence: 3.5x0.3x0.3 N/A N/A Measurements L x W x D (cm) 0.825 N/A N/A A (cm) : rea 0.247 N/A N/A Volume (cm) : 98.90% N/A N/A % Reduction in A rea: 99.90% N/A N/A % Reduction in Volume: 12 Position 1 (o'clock): 0.4 Maximum Distance 1 (cm): Yes N/A N/A Tunneling: Full Thickness With Exposed Support N/A N/A Classification: Structures Medium N/A N/A Exudate Amount: Serosanguineous N/A N/A Exudate Type: red, brown N/A N/A Exudate Color:  Distinct, outline attached N/A N/A Wound Margin: Large (67-100%) N/A N/A Granulation Amount: Red N/A N/A Granulation Quality: Small (1-33%) N/A N/A Necrotic Amount: Fat Layer (Subcutaneous Tissue): Yes N/A N/A Exposed Structures: Fascia: No Tendon: No Muscle: No Joint: No Bone: No Large (67-100%) N/A N/A Epithelialization: Treatment Notes Electronic Signature(s) Signed: 01/09/2022 4:19:15 PM By: [name] RN Signed: 01/09/2022 4:39:24 PM By: Linton Ham MD Entered By: Linton Ham on 01/09/2022 10:14:14 -------------------------------------------------------------------------------- [place] Details Patient Name: Date of Service: Christian Brown E. 01/09/2022 9:15 A M Medical Record Number: [id number] Patient Account Number: [id number] Date of Birth/Sex: Treating RN: Jan 27, 1966 (56 y.o. [name], [name] Primary Care Cherylee Rawlinson: [name] Other Clinician: Referring Shauni Henner: Treating Shanda Cadotte/Extender: [name] in Treatment: [place] reviewed with physician Active Inactive Wound/Skin Impairment Nursing Diagnoses: Impaired tissue integrity Goals: Patient/caregiver will verbalize understanding of skin care regimen Date Initiated: 03/28/2021 Target Resolution Date:  01/18/2022 Goal Status: Active Ulcer/skin breakdown will have a volume reduction of 30% by week 4 Date Initiated: 03/28/2021 Date Inactivated: 05/27/2021 Target Resolution Date: 05/31/2021 Goal Status: Met Interventions: Assess patient/caregiver ability to obtain necessary supplies Assess patient/caregiver ability to perform ulcer/skin care regimen upon admission and as needed Assess ulceration(s) every visit Provide education on ulcer and skin care Treatment Activities: Topical wound management initiated : 03/28/2021 Notes: Electronic Signature(s) Signed: 01/09/2022 4:19:15 PM By: [name] RN Entered By: [name] on 01/09/2022 09:37:54 -------------------------------------------------------------------------------- Pain Assessment Details Patient Name: Date of Service: Christian Grout. 01/09/2022 9:15 A M Medical Record Number: [id number] Patient Account Number: [id number] Date of Birth/Sex: Treating RN: 13-Nov-1965 (56 y.o. [name], [name] Primary Care Mathan Darroch: [name] Other Clinician: Referring Taesha Goodell: Treating Ena Demary/Extender: [name] in Treatment: 41 Active Problems Location of Pain Severity and Description of Pain Patient Has Paino No Site Locations Pain Management and Medication Current Pain Management: Electronic Signature(s) Signed: 01/09/2022 4:19:15 PM By: [name] RN Signed: 01/09/2022 4:22:00 PM By: Erenest Blank Entered By: Erenest Blank on 01/09/2022 09:07:03 -------------------------------------------------------------------------------- Patient/Caregiver Education Details Patient Name: Date of Service: Christian Grout 9/28/2023andnbsp9:15 A M Medical Record Number: [id number] Patient Account Number: [id number] Date of Birth/Gender: Treating RN: 01/28/1966 (56 y.o. [name] Primary Care Physician: [name] Other Clinician: Referring Physician: Treating Physician/Extender: [name] in Treatment: 18 Education Assessment Education Provided To: Patient Education Topics Provided Wound/Skin Impairment: Methods: Explain/Verbal Responses: State content correctly [employer]) Signed: 01/09/2022 4:19:15 PM By: [name] RN Entered By: [name] on 01/09/2022 09:45:54 -------------------------------------------------------------------------------- Wound Assessment Details Patient Name: Date of Service: Christian Grout. 01/09/2022 9:15 A M Medical Record Number: [id number] Patient Account Number: [id number] Date of Birth/Sex: Treating RN: 1965/05/12 (56 y.o. [name], [name] Primary Care Shaw Dobek: [name] Other Clinician: Referring Bria Portales: Treating Belynda Pagaduan/Extender: [name] in Treatment: 41 Wound Status Wound Number: 1 Primary Etiology: Open Surgical Wound Wound Location: Midline Back Wound Status: Open Wounding Event: Surgical Injury Comorbid History: Hypertension, Type II Diabetes, Osteomyelitis Date Acquired: 01/11/2021 Weeks Of Treatment: 41 Clustered Wound: No Photos Wound Measurements Length: (cm) 3.5 Width: (cm) 0.3 Depth: (cm) 0.3 Area: (cm) 0.825 Volume: (cm) 0.247 % Reduction in Area: 98.9% % Reduction in Volume: 99.9% Epithelialization: Large (67-100%) Tunneling: Yes Position (o'clock): 12 Maximum Distance: (cm) 0.4 Undermining: No Wound Description Classification: Full Thickness With Exposed Support Structures Wound Margin: Distinct, outline attached Exudate Amount: Medium Exudate Type: Serosanguineous Exudate Color: red, brown Foul  Odor After Cleansing: No Slough/Fibrino Yes Wound Bed Granulation Amount: Large (67-100%) Exposed Structure Granulation Quality: Red Fascia Exposed: No Necrotic Amount: Small (1-33%) Fat Layer (Subcutaneous Tissue) Exposed: Yes Necrotic Quality: Adherent Slough Tendon Exposed: No Muscle Exposed: No Joint Exposed:  No Bone Exposed: No Treatment Notes Wound #1 (Back) Wound Laterality: Midline Cleanser Peri-Wound Care Skin Prep Discharge Instruction: Use skin prep as directed Topical Primary Dressing [place] Discharge Instruction: applied by Frida Wahlstrom directly to wound bed. Secondary Dressing ADAPTIC TOUCH 3x4.25 in Discharge Instruction: Applied over primary dressing secured with steri-strips as directed. Woven Gauze Sponge, Non-Sterile 4x4 in Discharge Instruction: Apply over primary dressing as directed. Zetuvit Plus Silicone Border Dressing 7x7(in/in) Discharge Instruction: Apply silicone border over primary dressing as directed. Secured With Compression Wrap Compression Stockings Environmental education officer) Signed: 01/09/2022 4:19:15 PM By: [name] RN Signed: 01/09/2022 4:22:00 PM By: Erenest Blank Entered By: Erenest Blank on 01/09/2022 09:17:12 -------------------------------------------------------------------------------- Vitals Details Patient Name: Date of Service: Christian Brown E. 01/09/2022 9:15 A M Medical Record Number: [id number] Patient Account Number: [id number] Date of Birth/Sex: Treating RN: [birth year] (56 y.o. [name] Primary Care Maria Coin: [name] Other Clinician: Referring Avyan Livesay: Treating Lannah Koike/Extender: [name] in Treatment: 41 Vital Signs Time Taken: 09:06 Temperature (F): 97.9 Pulse (bpm): 88 Respiratory Rate (breaths/min): 18 Blood Pressure (mmHg): 145/78 Reference Range: 80 - 120 mg / dl Electronic Signature(s) Signed: 01/09/2022 4:22:00 PM By: Erenest Blank Entered By: Erenest Blank on 01/09/2022 09:06:55

## 2022-01-09 NOTE — Progress Notes (Signed)
FOSTER, [name] ([id number]) Visit Report for 01/09/2022 Cellular or Tissue Based Product Details Patient Name: Date of Service: [name] 01/09/2022 9:15 A M Medical Record Number: [id number] Patient Account Number: [id number] Date of Birth/Sex: Treating RN: 1965/10/29 (56 y.o. [name], [name] Primary Care Provider: [name] Other Clinician: Referring Provider: Treating Provider/Extender: [name] in Treatment: [phone] Cellular or Tissue Based Product Type Wound #1 Midline Back Applied to: Performed By: Physician Ricard Dillon., MD Cellular or Tissue Based Product Type: Epicord Level of Consciousness (Pre-procedure): Awake and Alert Pre-procedure Verification/Time Out Yes - 10:00 Taken: Location: trunk / arms / legs Wound Size (sq cm): 1.05 Product Size (sq cm): 6 Waste Size (sq cm): 0 Amount of Product Applied (sq cm): 6 Instrument Used: Forceps, Scissors Lot #: [id number] Expiration Date: 08/13/2026 Fenestrated: No Reconstituted: Yes Solution Type: NS Solution Amount: 3ML Lot #: [id number] Solution Expiration Date: 01/12/2022 Secured: Yes Secured With: Steri-Strips Dressing Applied: Yes Primary Dressing: ADAPTIC Procedural Pain: 0 Post Procedural Pain: 0 Response to Treatment: Procedure was tolerated well Level of Consciousness (Post- Awake and Alert procedure): Post Procedure Diagnosis Same as Pre-procedure Electronic Signature(s) Signed: 01/09/2022 4:19:15 PM By: [name] RN Signed: 01/09/2022 4:39:24 PM By: Linton Ham MD Entered By: [name] on 01/09/2022 10:14:17 -------------------------------------------------------------------------------- HPI Details Patient Name: Date of Service: Christian Brown E. 01/09/2022 9:15 A M Medical Record Number: [id number] Patient Account Number: [id number] Date of Birth/Sex: Treating RN: January 02, 1966 (56 y.o. [name] Primary Care Provider: [name] Other  Clinician: Referring Provider: Treating Provider/Extender: [name] in Treatment: 45 History of Present Illness HPI Description: Admission 03/28/2021 [name] is a 56 year old male with a past medical history of controlled type 2 diabetes on oral agents, obesity and OSA that presents to the clinic for a back wound. On 01/11/2021 patient had a laminectomy with PLIF of L1-S1 by Dr. Venetia Constable because of lumbar stenosis and radiculopathy. He subsequently developed bacteremia. He had CT imaging on 10/13 of the lumbar spine that showed fluid collection in the soft tissue of the posterior L1 and S1 and was taken to the OR for washout on 10/14. 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 He received 4 weeks of IV antibiotics by infectious disease. After his completion of 4 weeks of IV antibiotics he was continued for an additional 4 weeks of IV cefazolin with a stop date of 12/29. He has been evaluated by plastic surgery and no plans for surgical intervention at this time. Wife is present and reports he has been on the wound VAC for the past 8 weeks with improvement in wound healing. He currently denies systemic signs of infection. 12/22; patient presents for follow-up. He reports no issues since last clinic visit. He denies signs of infection. He has been tolerating the wound VAC well. 12/30; patient presents for follow-up. He reports no issues and has no complaints today. He has been tolerating the wound VAC well. 1/9; patient presents for follow-up. He has no issues or complaints today. He states he feels well. He has had no problems with the wound VAC. 1/16; patient presents for follow-up. He continues to use the wound VAC with no issues. He denies signs of infection. 1/23; patient presents for follow-up. He has been switched from IV cefazolin to oral cefadroxil by infectious disease. He has no issues or complaints today. He denies signs of  infection. He continues to tolerate the wound  VAC well. 1/30; patient presents for follow-up. He continues to tolerate the wound VAC well. 2/6; patient presents for follow-up. He has no issues or complaints today. He continues to tolerate the wound VAC well. He denies signs of infection. 2/13; patient presents for follow-up. He continues to do well with the wound VAC. He denies any issues. 2/27; patient presents for follow-up. He continues to use the wound VAC without any issues. He denies signs of infection. 3/20; patient presents for follow-up. He has no issues or complaints today. He continues to use the wound VAC. 4/3; patient presents for follow-up. He continues to use the wound VAC without issues. He denies signs of infection. 4/17; 2-week follow-up. He continues to do well. His measurements are improved. Initially a surgical wound complicated by infection. 5/1; patient presents for follow-up. He has no issues or complaints today. He continues to tolerate the wound VAC well. He denies signs of infection. 5/15; patient presents for follow-up. He has noted more maceration to the periwound. He has been using the wound VAC without issues. He currently denies signs of infection. 5/30; patient presents for follow-up. He has been tolerating the wound VAC well over the past 2 weeks. He no longer has maceration to the periwound. He has no issues or complaints today. 6/8; this patient with a postsurgical wound that was complicated by postop infection. He has been using silver collagen under wound VAC and gradually doing well improvement in dimensions especially the tunnel at 12:00 6/22; patient presents for follow-up. We have been using silver collagen under the wound VAC. Patient has no issues or questions today. 7/6; patient presents for follow-up. Patient continues to use collagen under the wound VAC with no issues. 7/20; patient presents for follow-up. He has been using collagen under the wound VAC  with no issues. He has been approved for Epicord. This was discussed with the patient and he is in agreement to having this placed at next clinic visit. 7/27; patient presents for follow-up. He has been tolerating the wound VAC well with collagen underneath. He has been approved for epi cord and we have this to place in office today. 8/4; patient presents for follow-up. He had Epicort placed at last clinic visit. We held off on the wound VAC. He tolerated this well. He has no issues or complaints today. 8/11; patient presents for follow-up. Epicord was placed at last clinic visit. He had the wound VAC on for the past week. He states that no drainage was suctioned into the canister. He has slight maceration to the periwound. 8/18; patient presents for follow-up. Epicord #3 was placed in standard fashion last week. The wound VAC was held. He has no issues or complaints today. 8/25; Patient presents for follow-up. Epicord #4 was placed in standard fashion last week. The wound VAC was held again. He has no issues or complaints today. There has been improvement in wound healing. 9/1; patient presents for follow-up. Epicort #5 was placed in standard fashion last week. He has no issues or complaints today. 9/8; patient presents for follow-up. Epicord #6 was placed in standard fashion last week. He tolerated this well and has no issues or complaints today. 9/15; Epicort No. 7 the wound looks healthy. Perhaps some hyper granulated tissue in the lower 50% of the longitudinal wound 9/22; Epicord #8 placed at last clinic visit. Wound appears well-healing. Patient has no issues or complaints today. 9/28. Epicord No. 9. Continued improvement. Electronic Signature(s) Signed: 01/09/2022 4:39:24 PM By: Linton Ham MD  Entered By: Linton Ham on 01/09/2022 10:16:19 -------------------------------------------------------------------------------- Physical Exam Details Patient Name: Date of Service: [name], [name] 01/09/2022 9:15 A M Medical Record Number: [id number] Patient Account Number: [id number] Date of Birth/Sex: Treating RN: March 04, 1966 (56 y.o. [name] Primary Care Provider: [name] Other Clinician: Referring Provider: Treating Provider/Extender: [name] in Treatment: 41 Constitutional Sitting or standing Blood Pressure is within target range for patient.. Pulse regular and within target range for patient.Marland Kitchen Respirations regular, non-labored and within target range.. Temperature is normal and within the target range for the patient.Marland Kitchen Appears in no distress. Notes Wound exam; lumbar spine surgical incision site. Small tunnel at the 12 o'clock position about the size of a #3 curette. He has granulation tissue in all wound areas no signs of infection. Continued improvement Electronic Signature(s) Signed: 01/09/2022 4:39:24 PM By: Linton Ham MD Entered By: Linton Ham on 01/09/2022 10:17:56 -------------------------------------------------------------------------------- Physician Orders Details Patient Name: Date of Service: Christian Brown E. 01/09/2022 9:15 A M Medical Record Number: [id number] Patient Account Number: [id number] Date of Birth/Sex: Treating RN: May 02, 1965 (56 y.o. 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 - Friday Dr. Heber [place] and Allayne Butcher, Rm # 9 Return A ppointment in 2 weeks. - Friday Dr. Heber [name] and Allayne Butcher, Rm # 9 Return A Anesthetic (In clinic) Topical Lidocaine 5% applied to wound bed Cellular or Tissue Based  Products Cellular or Tissue Based Product Type: - Epicord #3 applied 11/22/21 Epicord #4 11/29/21 Epicord #5 12/06/2021 Epicord #6 12/13/21 Epicord # 7 12/20/21 Epicord # 8 12/27/21 Epicord # 9 01/03/22 Epicord # 10 01/09/22 daptic or Mepitel. (DO NOT REMOVE). - leave Cellular or Tissue Based Product applied to wound bed, secured with steri-strips, cover with A the adaptic and steri-strips in place. Bathing/ Shower/ Hygiene May shower with protection but do not get wound dressing(s) wet. Negative Presssure Wound Therapy Discontinue wound vac - Patient to call [facility] to pick up wound vac. Additional Orders / Instructions Follow Nutritious Diet - Continue to monitor blood sugars daily Wound Treatment Wound #1 - Back Wound Laterality: Midline Peri-Wound Care: Skin Prep Every Other Day/30 Days Discharge Instructions: Use skin prep as directed Prim Dressing: Epicord Every Other Day/30 Days ary Discharge Instructions: applied by provider directly to wound bed. Secondary Dressing: ADAPTIC TOUCH 3x4.25 in Every Other Day/30 Days Discharge Instructions: Applied over primary dressing secured with steri-strips as directed. Secondary Dressing: Woven Gauze Sponge, Non-Sterile 4x4 in Every Other Day/30 Days Discharge Instructions: Apply over primary dressing as directed. Secondary Dressing: Zetuvit Plus Silicone Border Dressing 7x7(in/in) (Generic) Every Other Day/30 Days Discharge Instructions: Apply silicone border over primary dressing as directed. Electronic Signature(s) Signed: 01/09/2022 4:19:15 PM By: [name] RN Signed: 01/09/2022 4:39:24 PM By: Linton Ham MD Entered By: [name] on 01/09/2022 10:07:58 -------------------------------------------------------------------------------- Problem List Details Patient Name: Date of Service: Christian Brown E. 01/09/2022 9:15 A M Medical Record Number: [id number] Patient Account Number: [id number] Date of Birth/Sex: Treating  RN: May 13, 1965 (56 y.o. 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 01/09/2022 9:15 A M Medical Record Number: [id number] Patient Account Number: [id number] Date of Birth/Sex: Treating RN: [birth year] (56 y.o. [name] Primary Care Provider: [name] Other Clinician: Referring Provider: Treating Provider/Extender: [name] in Treatment: 41 Subjective History of Present Illness (HPI) Admission 03/28/2021 Mr. Rhiley Solem is a 56 year old male with a past medical history of controlled type 2 diabetes on oral agents, obesity and OSA that presents to the clinic for a back wound. On 01/11/2021 patient had a laminectomy with PLIF of L1-S1 by Dr. Venetia Constable because of lumbar stenosis and radiculopathy. He subsequently developed bacteremia. He had CT imaging on 10/13 of the lumbar spine that showed fluid collection in the soft tissue of the posterior L1 and S1 and was taken to the OR for washout on 10/14. 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 He received 4 weeks of IV antibiotics by infectious disease. After his completion of 4 weeks of IV antibiotics he was continued for an additional 4 weeks of IV cefazolin with a  stop date of 12/29. He has been evaluated by plastic surgery and no plans for surgical intervention at this time. Wife is present and reports he has been on the wound VAC for the past 8 weeks with improvement in wound healing. He currently denies systemic signs of infection. 12/22; patient presents for follow-up. He reports no issues since last clinic visit. He denies signs of infection. He has been tolerating the wound VAC well. 12/30; patient presents for follow-up. He reports no issues and has no complaints today. He has been tolerating the wound VAC well. 1/9; patient presents for follow-up. He has no issues or complaints today. He states he feels well. He has had no problems with the wound VAC. 1/16; patient presents for follow-up. He continues to use the wound VAC with no issues. He denies signs of infection. 1/23; patient presents for follow-up. He has been switched from IV cefazolin to oral cefadroxil by infectious disease. He has no issues or complaints today. He denies signs of infection. He continues to tolerate the wound VAC well. 1/30; patient presents for follow-up. He continues to tolerate the wound VAC well. 2/6; patient presents for follow-up. He has no issues or complaints today. He continues to tolerate the wound VAC well. He denies signs of infection. 2/13; patient presents for follow-up. He continues to do well with the wound VAC. He denies any issues. 2/27; patient presents for follow-up. He continues to use the wound VAC without any issues. He denies signs of infection. 3/20; patient presents for follow-up. He has no issues or complaints today. He continues to use the wound VAC. 4/3; patient presents for follow-up. He continues to use the wound VAC without issues. He denies signs of infection. 4/17; 2-week follow-up. He continues to do well. His measurements are improved. Initially a surgical wound complicated by infection. 5/1; patient presents for follow-up. He has no issues  or complaints today. He continues to tolerate the wound VAC well. He denies signs of infection. 5/15; patient presents for follow-up. He has noted more maceration to  the periwound. He has been using the wound VAC without issues. He currently denies signs of infection. 5/30; patient presents for follow-up. He has been tolerating the wound VAC well over the past 2 weeks. He no longer has maceration to the periwound. He has no issues or complaints today. 6/8; this patient with a postsurgical wound that was complicated by postop infection. He has been using silver collagen under wound VAC and gradually doing well improvement in dimensions especially the tunnel at 12:00 6/22; patient presents for follow-up. We have been using silver collagen under the wound VAC. Patient has no issues or questions today. 7/6; patient presents for follow-up. Patient continues to use collagen under the wound VAC with no issues. 7/20; patient presents for follow-up. He has been using collagen under the wound VAC with no issues. He has been approved for Epicord. This was discussed with the patient and he is in agreement to having this placed at next clinic visit. 7/27; patient presents for follow-up. He has been tolerating the wound VAC well with collagen underneath. He has been approved for epi cord and we have this to place in office today. 8/4; patient presents for follow-up. He had Epicort placed at last clinic visit. We held off on the wound VAC. He tolerated this well. He has no issues or complaints today. 8/11; patient presents for follow-up. Epicord was placed at last clinic visit. He had the wound VAC on for the past week. He states that no drainage was suctioned into the canister. He has slight maceration to the periwound. 8/18; patient presents for follow-up. Epicord #3 was placed in standard fashion last week. The wound VAC was held. He has no issues or complaints today. 8/25; Patient presents for follow-up.  Epicord #4 was placed in standard fashion last week. The wound VAC was held again. He has no issues or complaints today. There has been improvement in wound healing. 9/1; patient presents for follow-up. Epicort #5 was placed in standard fashion last week. He has no issues or complaints today. 9/8; patient presents for follow-up. Epicord #6 was placed in standard fashion last week. He tolerated this well and has no issues or complaints today. 9/15; Epicort No. 7 the wound looks healthy. Perhaps some hyper granulated tissue in the lower 50% of the longitudinal wound 9/22; Epicord #8 placed at last clinic visit. Wound appears well-healing. Patient has no issues or complaints today. 9/28. Epicord No. 9. Continued improvement. Objective Constitutional Sitting or standing Blood Pressure is within target range for patient.. Pulse regular and within target range for patient.Marland Kitchen Respirations regular, non-labored and within target range.. Temperature is normal and within the target range for the patient.Marland Kitchen Appears in no distress. Vitals Time Taken: 9:06 AM, Temperature: 97.9 F, Pulse: 88 bpm, Respiratory Rate: 18 breaths/min, Blood Pressure: 145/78 mmHg. General Notes: Wound exam; lumbar spine surgical incision site. Small tunnel at the 12 o'clock position about the size of a #3 curette. He has granulation tissue in all wound areas no signs of infection. Continued improvement Integumentary (Hair, Skin) Wound #1 status is Open. Original cause of wound was Surgical Injury. The date acquired was: 01/11/2021. The wound has been in treatment 41 weeks. The wound is located on the Midline Back. The wound measures 3.5cm length x 0.3cm width x 0.3cm depth; 0.825cm^2 area and 0.247cm^3 volume. There is Fat Layer (Subcutaneous Tissue) exposed. There is no undermining noted, however, there is tunneling at 12:00 with a maximum distance of 0.4cm. There is a medium amount of  serosanguineous drainage noted. The wound margin  is distinct with the outline attached to the wound base. There is large (67-100%) red granulation within the wound bed. There is a small (1-33%) amount of necrotic tissue within the wound bed including Adherent Slough. 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 A skin graft procedure using a bioengineered skin substitute/cellular or tissue based product was performed by Ricard Dillon., MD with the following instrument(s): Forceps and Scissors. Epicord was applied and secured with Steri-Strips. 6 sq cm of product was utilized and 0 sq cm was wasted. Post Application, ADAPTIC was applied. A Time Out was conducted at 10:00, prior to the start of the procedure. The procedure was tolerated well with a pain level of 0 throughout and a pain level of 0 following the procedure. Post procedure Diagnosis Wound #1: Same as Pre-Procedure . Plan Follow-up Appointments: Return Appointment in 1 week. - Friday Dr. Heber [place] and Allayne Butcher, Rm # 9 Return Appointment in 2 weeks. - Friday Dr. Heber [place] and Allayne Butcher, Rm # 9 Anesthetic: (In clinic) Topical Lidocaine 5% applied to wound bed Cellular or Tissue Based Products: Cellular or Tissue Based Product Type: - Epicord #3 applied 11/22/21 Epicord #4 11/29/21 Epicord #5 12/06/2021 Epicord #6 12/13/21 Epicord # 7 12/20/21 Epicord # 8 12/27/21 Epicord # 9 01/03/22 Epicord # 10 01/09/22 Cellular or Tissue Based Product applied to wound bed, secured with steri-strips, cover with Adaptic or Mepitel. (DO NOT REMOVE). - leave the adaptic and steri-strips in place. Bathing/ Shower/ Hygiene: May shower with protection but do not get wound dressing(s) wet. Negative Presssure Wound Therapy: Discontinue wound vac - Patient to call [facility] to pick up  wound vac. Additional Orders / Instructions: Follow Nutritious Diet - Continue to monitor blood sugars daily WOUND #1: - Back Wound Laterality: Midline Peri-Wound Care: Skin Prep Every Other Day/30 Days Discharge Instructions: Use skin prep as directed Prim Dressing: Epicord Every Other Day/30 Days ary Discharge Instructions: applied by provider directly to wound bed. Secondary Dressing: ADAPTIC TOUCH 3x4.25 in Every Other Day/30 Days Discharge Instructions: Applied over primary dressing secured with steri-strips as directed. Secondary Dressing: Woven Gauze Sponge, Non-Sterile 4x4 in Every Other Day/30 Days Discharge Instructions: Apply over primary dressing as directed. Secondary Dressing: Zetuvit Plus Silicone Border Dressing 7x7(in/in) (Generic) Every Other Day/30 Days Discharge Instructions: Apply silicone border over primary dressing as directed. 1. Epicord No. 9 in the standard fashion. Electronic Signature(s) Signed: 01/09/2022 4:39:24 PM By: Linton Ham MD Entered By: Linton Ham on 01/09/2022 10:18:32 -------------------------------------------------------------------------------- SuperBill Details Patient Name: Date of Service: [name] 01/09/2022 Medical Record Number: [id number] Patient Account Number: [id number] Date of Birth/Sex: Treating RN: [birth year] (56 y.o. 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

## 2022-01-13 ENCOUNTER — Other Ambulatory Visit (HOSPITAL_COMMUNITY): Payer: Self-pay

## 2022-01-17 ENCOUNTER — Encounter (HOSPITAL_BASED_OUTPATIENT_CLINIC_OR_DEPARTMENT_OTHER): Payer: No Typology Code available for payment source | Attending: Internal Medicine | Admitting: Internal Medicine

## 2022-01-17 DIAGNOSIS — L98424 Non-pressure chronic ulcer of back with necrosis of bone: Secondary | ICD-10-CM | POA: Diagnosis not present

## 2022-01-17 DIAGNOSIS — M869 Osteomyelitis, unspecified: Secondary | ICD-10-CM | POA: Insufficient documentation

## 2022-01-17 DIAGNOSIS — E1165 Type 2 diabetes mellitus with hyperglycemia: Secondary | ICD-10-CM | POA: Insufficient documentation

## 2022-01-17 DIAGNOSIS — E11622 Type 2 diabetes mellitus with other skin ulcer: Secondary | ICD-10-CM | POA: Insufficient documentation

## 2022-01-17 DIAGNOSIS — Z7984 Long term (current) use of oral hypoglycemic drugs: Secondary | ICD-10-CM | POA: Insufficient documentation

## 2022-01-17 DIAGNOSIS — G4733 Obstructive sleep apnea (adult) (pediatric): Secondary | ICD-10-CM | POA: Insufficient documentation

## 2022-01-17 NOTE — Progress Notes (Signed)
[name], [name] ([id number]) Visit Report for 01/17/2022 Chief Complaint Document Details Patient Name: Date of Service: [name] 01/17/2022 9:15 A M Medical Record Number: [id number] Patient Account Number: [id number] Date of Birth/Sex: Treating RN: [birth year] (56 y.o. [name] Primary Care Provider: [name] Other Clinician: Referring Provider: Treating Provider/Extender: [name] Weeks in Treatment: 68 Information Obtained from: Patient Chief Complaint 03/28/2021; Back wound Electronic Signature(s) Signed: 01/17/2022 12:14:24 PM By: Kalman Shan DO Entered By: Kalman Shan on 01/17/2022 09:52:07 -------------------------------------------------------------------------------- HPI Details Patient Name: Date of Service: Christian Brown E. 01/17/2022 9:15 A M Medical Record Number: [id number] Patient Account Number: [id number] Date of Birth/Sex: Treating RN: 1965-08-27 (56 y.o. [name] Primary Care Provider: [name] Other Clinician: Referring Provider: Treating Provider/Extender: [name] Weeks in Treatment: 1 History of Present Illness HPI Description: Admission 03/28/2021 [name] is a 56 year old male with a past medical history of controlled type 2 diabetes on oral agents, obesity and OSA that presents to the clinic for a back wound. On 01/11/2021 patient had a laminectomy with PLIF of L1-S1 by Dr. Venetia Constable because of lumbar stenosis and radiculopathy. He subsequently developed bacteremia. He had CT imaging on 10/13 of the lumbar spine that showed fluid collection in the soft tissue of the posterior L1 and S1 and was taken to the OR for washout on 10/14. 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 He received 4 weeks of IV antibiotics by infectious disease. After his completion of 4 weeks of IV antibiotics he was continued for an additional 4 weeks of IV cefazolin with a stop  date of 12/29. He has been evaluated by plastic surgery and no plans for surgical intervention at this time. Wife is present and reports he has been on the wound VAC for the past 8 weeks with improvement in wound healing. He currently denies systemic signs of infection. 12/22; patient presents for follow-up. He reports no issues since last clinic visit. He denies signs of infection. He has been tolerating the wound VAC well. 12/30; patient presents for follow-up. He reports no issues and has no complaints today. He has been tolerating the wound VAC well. 1/9; patient presents for follow-up. He has no issues or complaints today. He states he feels well. He has had no problems with the wound VAC. 1/16; patient presents for follow-up. He continues to use the wound VAC with no issues. He denies signs of infection. 1/23; patient presents for follow-up. He has been switched from IV cefazolin to oral cefadroxil by infectious disease. He has no issues or complaints today. He denies signs of infection. He continues to tolerate the wound VAC well. 1/30; patient presents for follow-up. He continues to tolerate the wound VAC well. 2/6; patient presents for follow-up. He has no issues or complaints today. He continues to tolerate the wound VAC well. He denies signs of infection. 2/13; patient presents for follow-up. He continues to do well with the wound VAC. He denies any issues. 2/27; patient presents for follow-up. He continues to use the wound VAC without any issues. He denies signs of infection. 3/20; patient presents for follow-up. He has no issues or complaints today. He continues to use the wound VAC. 4/3; patient presents for follow-up. He continues to use the wound VAC without issues. He denies signs of infection. 4/17; 2-week follow-up. He continues to do well. His measurements are improved. Initially a surgical wound complicated by infection. 5/1;  patient presents for follow-up. He has no issues or  complaints today. He continues to tolerate the wound VAC well. He denies signs of infection. 5/15; patient presents for follow-up. He has noted more maceration to the periwound. He has been using the wound VAC without issues. He currently denies signs of infection. 5/30; patient presents for follow-up. He has been tolerating the wound VAC well over the past 2 weeks. He no longer has maceration to the periwound. He has no issues or complaints today. 6/8; this patient with a postsurgical wound that was complicated by postop infection. He has been using silver collagen under wound VAC and gradually doing well improvement in dimensions especially the tunnel at 12:00 6/22; patient presents for follow-up. We have been using silver collagen under the wound VAC. Patient has no issues or questions today. 7/6; patient presents for follow-up. Patient continues to use collagen under the wound VAC with no issues. 7/20; patient presents for follow-up. He has been using collagen under the wound VAC with no issues. He has been approved for Epicord. This was discussed with the patient and he is in agreement to having this placed at next clinic visit. 7/27; patient presents for follow-up. He has been tolerating the wound VAC well with collagen underneath. He has been approved for epi cord and we have this to place in office today. 8/4; patient presents for follow-up. He had Epicort placed at last clinic visit. We held off on the wound VAC. He tolerated this well. He has no issues or complaints today. 8/11; patient presents for follow-up. Epicord was placed at last clinic visit. He had the wound VAC on for the past week. He states that no drainage was suctioned into the canister. He has slight maceration to the periwound. 8/18; patient presents for follow-up. Epicord #3 was placed in standard fashion last week. The wound VAC was held. He has no issues or complaints today. 8/25; Patient presents for follow-up. Epicord  #4 was placed in standard fashion last week. The wound VAC was held again. He has no issues or complaints today. There has been improvement in wound healing. 9/1; patient presents for follow-up. Epicort #5 was placed in standard fashion last week. He has no issues or complaints today. 9/8; patient presents for follow-up. Epicord #6 was placed in standard fashion last week. He tolerated this well and has no issues or complaints today. 9/15; Epicort No. 7 the wound looks healthy. Perhaps some hyper granulated tissue in the lower 50% of the longitudinal wound 9/22; Epicord #8 placed at last clinic visit. Wound appears well-healing. Patient has no issues or complaints today. 9/28. Epicord No. 9. Continued improvement. 10/6; patient has completed the allotted epicord Applications that insurance would cover. Insurance denied extending more applications. Patient has no issues or complaints today. He tolerated the last application well. Electronic Signature(s) Signed: 01/17/2022 12:14:24 PM By: Kalman Shan DO Entered By: Kalman Shan on 01/17/2022 09:53:25 -------------------------------------------------------------------------------- Physical Exam Details Patient Name: Date of Service: Christian Brown E. 01/17/2022 9:15 A M Medical Record Number: [id number] Patient Account Number: [id number] Date of Birth/Sex: Treating RN: 1965/09/12 (56 y.o. [name] Primary Care Provider: [name] Other Clinician: Referring Provider: Treating Provider/Extender: [name] Weeks in Treatment: 20 Constitutional respirations regular, non-labored and within target range for patient.Marland Kitchen Psychiatric pleasant and cooperative. Notes Wound exam; lumbar spine surgical incision site. Small tunnel at the 12 o'clock position about the size of a #3 curette. He has granulation tissue in all wound  areas no signs of infection. Continued improvement Electronic Signature(s) Signed: 01/17/2022  12:14:24 PM By: Kalman Shan DO Entered By: Kalman Shan on 01/17/2022 09:54:03 -------------------------------------------------------------------------------- Physician Orders Details Patient Name: Date of Service: Christian Grout. 01/17/2022 9:15 A M Medical Record Number: [id number] Patient Account Number: [id number] Date of Birth/Sex: Treating RN: 11/05/1965 (56 y.o. [name] Primary Care Provider: [name] Other Clinician: Referring Provider: Treating Provider/Extender: [name] Weeks in Treatment: 46 Verbal / Phone Orders: No Diagnosis Coding Follow-up Appointments ppointment in 1 week. - Friday Dr. Heber [place] and Allayne Butcher, Rm # 9 Return A ppointment in 2 weeks. - Friday Dr. Heber [place] and Allayne Butcher, Rm # 9 Return A Anesthetic (In clinic) Topical Lidocaine 5% applied to wound bed Cellular or Tissue Based Products Cellular or Tissue Based Product Type: - Epicord #3 applied 11/22/21 Epicord #4 11/29/21 Epicord #5 12/06/2021 Epicord #6 12/13/21 Epicord # 7 12/20/21 Epicord # 8 12/27/21 Epicord # 9 01/03/22 Epicord # 10 01/09/22 daptic or Mepitel. (DO NOT REMOVE). - leave Cellular or Tissue Based Product applied to wound bed, secured with steri-strips, cover with A the adaptic and steri-strips in place. Bathing/ Shower/ Hygiene May shower with protection but do not get wound dressing(s) wet. Negative Presssure Wound Therapy Discontinue wound vac - Patient to call [facility] to pick up wound vac. Additional Orders / Instructions Follow Nutritious Diet - Continue to monitor blood sugars daily Wound Treatment Wound #1 - Back Wound Laterality: Midline Peri-Wound Care: Skin Prep Every Other Day/30 Days Discharge Instructions: Use skin prep as directed Prim Dressing: Endoform 2x2 in Every Other Day/30 Days ary Discharge Instructions: Moisten with saline Secondary Dressing: ADAPTIC TOUCH 3x4.25 in Every Other Day/30 Days Discharge Instructions: Applied  over primary dressing secured with steri-strips as directed. Secondary Dressing: Woven Gauze Sponge, Non-Sterile 4x4 in Every Other Day/30 Days Discharge Instructions: Apply over primary dressing as directed. Secondary Dressing: Zetuvit Plus Silicone Border Dressing 7x7(in/in) (Generic) Every Other Day/30 Days Discharge Instructions: Apply silicone border over primary dressing as directed. Electronic Signature(s) Signed: 01/17/2022 12:14:24 PM By: Kalman Shan DO Entered By: Kalman Shan on 01/17/2022 09:54:10 -------------------------------------------------------------------------------- Problem List Details Patient Name: Date of Service: Christian Grout. 01/17/2022 9:15 A M Medical Record Number: [id number] Patient Account Number: [id number] Date of Birth/Sex: Treating RN: 03/29/1966 (56 y.o. 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 01/17/2022 9:15 A M Medical Record Number: [id number] Patient Account Number: [id number] Date of Birth/Sex: Treating RN: Jun 28, 1965 (56 y.o. [name] Primary Care Provider: [name] Other Clinician: Referring Provider: Treating Provider/Extender: [name] Weeks in Treatment:  81 Subjective Chief Complaint Information obtained from Patient 03/28/2021; Back wound History of Present Illness (HPI) Admission 03/28/2021 Mr. Jabe Jeanbaptiste is a 56 year old male with a past medical history of controlled type 2 diabetes on oral agents, obesity and OSA that presents to the clinic for a back wound. On 01/11/2021 patient had a laminectomy with PLIF of L1-S1 by Dr. Venetia Constable  because of lumbar stenosis and radiculopathy. He subsequently developed bacteremia. He had CT imaging on 10/13 of the lumbar spine that showed fluid collection in the soft tissue of the posterior L1 and S1 and was taken to the OR for washout on 10/14. 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 He received 4 weeks of IV antibiotics by infectious disease. After his completion of 4 weeks of IV antibiotics he was continued for an additional 4 weeks of IV cefazolin with a stop date of 12/29. He has been evaluated by plastic surgery and no plans for surgical intervention at this time. Wife is present and reports he has been on the wound VAC for the past 8 weeks with improvement in wound healing. He currently denies systemic signs of infection. 12/22; patient presents for follow-up. He reports no issues since last clinic visit. He denies signs of infection. He has been tolerating the wound VAC well. 12/30; patient presents for follow-up. He reports no issues and has no complaints today. He has been tolerating the wound VAC well. 1/9; patient presents for follow-up. He has no issues or complaints today. He states he feels well. He has had no problems with the wound VAC. 1/16; patient presents for follow-up. He continues to use the wound VAC with no issues. He denies signs of infection. 1/23; patient presents for follow-up. He has been switched from IV cefazolin to oral cefadroxil by infectious disease. He has no issues or complaints today. He denies signs of infection. He continues to tolerate  the wound VAC well. 1/30; patient presents for follow-up. He continues to tolerate the wound VAC well. 2/6; patient presents for follow-up. He has no issues or complaints today. He continues to tolerate the wound VAC well. He denies signs of infection. 2/13; patient presents for follow-up. He continues to do well with the wound VAC. He denies any issues. 2/27; patient presents for follow-up. He continues to use the wound VAC without any issues. He denies signs of infection. 3/20; patient presents for follow-up. He has no issues or complaints today. He continues to use the wound VAC. 4/3; patient presents for follow-up. He continues to use the wound VAC without issues. He denies signs of infection. 4/17; 2-week follow-up. He continues to do well. His measurements are improved. Initially a surgical wound complicated by infection. 5/1; patient presents for follow-up. He has no issues or complaints today. He continues to tolerate the wound VAC well. He denies signs of infection. 5/15; patient presents for follow-up. He has noted more maceration to the periwound. He has been using the wound VAC without issues. He currently denies signs of infection. 5/30; patient presents for follow-up. He has been tolerating the wound VAC well over the past 2 weeks. He no longer has maceration to the periwound. He has no issues or complaints today. 6/8; this patient with a postsurgical wound that was complicated by postop infection. He has been using silver collagen under wound VAC and gradually doing well improvement in dimensions especially the tunnel at 12:00 6/22; patient presents for follow-up. We have been using silver collagen under the wound VAC. Patient has no issues or questions today. 7/6; patient presents for follow-up. Patient continues to use collagen under the wound VAC with no issues. 7/20; patient presents for follow-up. He has been using collagen under the wound VAC with no issues. He has been approved  for Epicord. This was discussed with the patient and he is in agreement to having this  placed at next clinic visit. 7/27; patient presents for follow-up. He has been tolerating the wound VAC well with collagen underneath. He has been approved for epi cord and we have this to place in office today. 8/4; patient presents for follow-up. He had Epicort placed at last clinic visit. We held off on the wound VAC. He tolerated this well. He has no issues or complaints today. 8/11; patient presents for follow-up. Epicord was placed at last clinic visit. He had the wound VAC on for the past week. He states that no drainage was suctioned into the canister. He has slight maceration to the periwound. 8/18; patient presents for follow-up. Epicord #3 was placed in standard fashion last week. The wound VAC was held. He has no issues or complaints today. 8/25; Patient presents for follow-up. Epicord #4 was placed in standard fashion last week. The wound VAC was held again. He has no issues or complaints today. There has been improvement in wound healing. 9/1; patient presents for follow-up. Epicort #5 was placed in standard fashion last week. He has no issues or complaints today. 9/8; patient presents for follow-up. Epicord #6 was placed in standard fashion last week. He tolerated this well and has no issues or complaints today. 9/15; Epicort No. 7 the wound looks healthy. Perhaps some hyper granulated tissue in the lower 50% of the longitudinal wound 9/22; Epicord #8 placed at last clinic visit. Wound appears well-healing. Patient has no issues or complaints today. 9/28. Epicord No. 9. Continued improvement. 10/6; patient has completed the allotted epicord Applications that insurance would cover. Insurance denied extending more applications. Patient has no issues or complaints today. He tolerated the last application well. Patient History Information obtained from Patient. Family History Diabetes - Mother,  Stroke - Siblings, No family history of Cancer, Heart Disease, Hereditary Spherocytosis, Hypertension, Kidney Disease, Lung Disease, Seizures, Thyroid Problems, Tuberculosis. Social History Never smoker, Marital Status - Married, Alcohol Use - Rarely, Drug Use - No History, Caffeine Use - Rarely. Medical History Cardiovascular Patient has history of Hypertension Endocrine Patient has history of Type II Diabetes Musculoskeletal Patient has history of Osteomyelitis Medical A Surgical History Notes nd Musculoskeletal DDD Objective Constitutional respirations regular, non-labored and within target range for patient.. Vitals Time Taken: 9:03 AM, Temperature: 97.7 F, Pulse: 75 bpm, Respiratory Rate: 17 breaths/min, Blood Pressure: 138/77 mmHg. Psychiatric pleasant and cooperative. General Notes: Wound exam; lumbar spine surgical incision site. Small tunnel at the 12 o'clock position about the size of a #3 curette. He has granulation tissue in all wound areas no signs of infection. Continued improvement Integumentary (Hair, Skin) Wound #1 status is Open. Original cause of wound was Surgical Injury. The date acquired was: 01/11/2021. The wound has been in treatment 42 weeks. The wound is located on the Midline Back. The wound measures 3.5cm length x 0.3cm width x 0.3cm depth; 0.825cm^2 area and 0.247cm^3 volume. There is Fat Layer (Subcutaneous Tissue) exposed. There is no tunneling or undermining noted. There is a medium amount of serosanguineous drainage noted. The wound margin is distinct with the outline attached to the wound base. There is large (67-100%) red granulation within the wound bed. There is a small (1-33%) amount of necrotic tissue within the wound bed including Adherent Slough. 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 Insurance has denied further [place] [employer].  For now I recommended endoform. Follow-up in 1 week Plan Follow-up Appointments: Return Appointment in 1 week. - Friday Dr. Heber [place] and Allayne Butcher, Rm # 9 Return Appointment in 2 weeks. - Friday Dr. Heber [place] and Allayne Butcher, Rm # 9 Anesthetic: (In clinic) Topical Lidocaine 5% applied to wound bed Cellular or Tissue Based Products: Cellular or Tissue Based Product Type: - Epicord #3 applied 11/22/21 Epicord #4 11/29/21 Epicord #5 12/06/2021 Epicord #6 12/13/21 Epicord # 7 12/20/21 Epicord # 8 12/27/21 Epicord # 9 01/03/22 Epicord # 10 01/09/22 Cellular or Tissue Based Product applied to wound bed, secured with steri-strips, cover with Adaptic or Mepitel. (DO NOT REMOVE). - leave the adaptic and steri-strips in place. Bathing/ Shower/ Hygiene: May shower with protection but do not get wound dressing(s) wet. Negative Presssure Wound Therapy: Discontinue wound vac - Patient to call [facility] to pick up wound vac. Additional Orders / Instructions: Follow Nutritious Diet - Continue to monitor blood sugars daily WOUND #1: - Back Wound Laterality: Midline Peri-Wound Care: Skin Prep Every Other Day/30 Days Discharge Instructions: Use skin prep as directed Prim Dressing: Endoform 2x2 in Every Other Day/30 Days ary Discharge Instructions: Moisten with saline Secondary Dressing: ADAPTIC TOUCH 3x4.25 in Every Other Day/30 Days Discharge Instructions: Applied over primary dressing secured with steri-strips as directed. Secondary Dressing: Woven Gauze Sponge, Non-Sterile 4x4 in Every Other Day/30 Days Discharge Instructions: Apply over primary dressing as directed. Secondary Dressing: Zetuvit Plus Silicone Border Dressing 7x7(in/in) (Generic) Every Other Day/30 Days Discharge Instructions: Apply silicone border over primary dressing as directed. 1. Endoform 2. Follow-up in 1 week Electronic  Signature(s) Signed: 01/17/2022 12:14:24 PM By: Kalman Shan DO Entered By: Kalman Shan on 01/17/2022 09:54:51 -------------------------------------------------------------------------------- HxROS Details Patient Name: Date of Service: Christian Brown E. 01/17/2022 9:15 A M Medical Record Number: [id number] Patient Account Number: [id number] Date of Birth/Sex: Treating RN: 1965-10-01 (56 y.o. [name], [place] Primary Care Provider: [name] Other Clinician: Referring Provider: Treating Provider/Extender: [name] Weeks in Treatment: 81 Information Obtained From Patient Cardiovascular Medical History: Positive for: Hypertension Endocrine Medical History: Positive for: Type II Diabetes Time with diabetes: Since mid 90's Treated with: Oral agents Blood sugar tested every day: Yes Tested : 2x day Musculoskeletal Medical History: Positive for: Osteomyelitis Past Medical History Notes: DDD Immunizations Pneumococcal Vaccine: Received Pneumococcal Vaccination: Yes Received Pneumococcal Vaccination On or After 60th Birthday: No Implantable Devices Yes Family and Social History Cancer: No; Diabetes: Yes - Mother; Heart Disease: No; Hereditary Spherocytosis: No; Hypertension: No; Kidney Disease: No; Lung Disease: No; Seizures: No; Stroke: Yes - Siblings; Thyroid Problems: No; Tuberculosis: No; Never smoker; Marital Status - Married; Alcohol Use: Rarely; Drug Use: No History; Caffeine Use: Rarely; Financial Concerns: No; Food, Clothing or Shelter Needs: No; Support System Lacking: No; Transportation Concerns: No Electronic Signature(s) Signed: 01/17/2022 11:59:37 AM By: [name] RN Signed: 01/17/2022 12:14:24 PM By: Kalman Shan DO Entered By: Kalman Shan on 01/17/2022 09:53:29 -------------------------------------------------------------------------------- SuperBill Details Patient Name: Date of Service: Christian Grout.  01/17/2022 Medical Record Number: [id number] Patient Account Number: [id number] Date of Birth/Sex: Treating RN: January 19, 1966 (56 y.o. 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 M86.9 Osteomyelitis, unspecified E11.622 Type 2 diabetes mellitus with other skin ulcer Facility Procedures CPT4 Code: [id number] Description: 99213 - WOUND CARE VISIT-LEV 3 EST PT Modifier: Quantity:  1 Physician Procedures : CPT4 Code Description Modifier [id number] - WC PHYS LEVEL 3 - EST PT ICD-10 Diagnosis Description L98.424 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

## 2022-01-17 NOTE — Progress Notes (Signed)
E[facility], [facility] ([id number]) Visit Report for 01/17/2022 Arrival Information Details Patient Name: Date of Service: [name], [name] 01/17/2022 9:15 A M Medical Record Number: [id number] Patient Account Number: [id number] Date of Birth/Sex: [name]: [birth year] (56 y.o. [name] Primary Care [name]: [name] Other Clinician: Referring [name]: Christian [name]/Extender: [name] in Treatment: 41 Visit Information History Since Last Visit Added or deleted any medications: No Patient Arrived: Ambulatory Any new allergies or adverse reactions: No Arrival Time: 09:03 Had a fall or experienced change in No Accompanied By: self activities of daily living that may affect Transfer Assistance: None risk of falls: Patient Identification Verified: Yes Signs or symptoms of abuse/neglect since last visito No Secondary Verification Process Completed: Yes Hospitalized since last visit: No Patient Requires Transmission-Based Precautions: No Implantable device outside of the clinic excluding No Patient Has Alerts: Yes cellular tissue based products placed in the center Patient Alerts: Patient on Blood Thinner since last visit: No BP Right Arm-PICC Has Dressing in Place as Prescribed: Yes Pain Present Now: No Electronic Signature(s) Signed: 01/17/2022 11:59:37 AM By: Christian Hammock Murphy Entered By: [name] on 01/17/2022 09:03:53 -------------------------------------------------------------------------------- Clinic Level of Care Assessment Details Patient Name: Date of Service: [name] 01/17/2022 9:15 A M Medical Record Number: [id number] Patient Account Number: [id number] Date of Birth/Sex: [name]: 1965-10-05 (56 y.o. [name] Primary Care [name]: [name] Other Clinician: Referring [name]: Christian [name]/Extender: [name] in Treatment: 42 Clinic Level of Care Assessment Items TOOL 4  Quantity Score X- 1 0 Use when only an EandM is performed on FOLLOW-UP visit ASSESSMENTS - Nursing Assessment / Reassessment X- 1 10 Reassessment of Co-morbidities (includes updates in patient status) X- 1 5 Reassessment of Adherence to Treatment Plan ASSESSMENTS - Wound and Skin A ssessment / Reassessment X - Simple Wound Assessment / Reassessment - one wound 1 5 '[]'  - 0 Complex Wound Assessment / Reassessment - multiple wounds '[]'  - 0 Dermatologic / Skin Assessment (not related to wound area) ASSESSMENTS - Focused Assessment '[]'  - 0 Circumferential Edema Measurements - multi extremities '[]'  - 0 Nutritional Assessment / Counseling / Intervention '[]'  - 0 Lower Extremity Assessment (monofilament, tuning fork, pulses) '[]'  - 0 Peripheral Arterial Disease Assessment (using hand held doppler) ASSESSMENTS - Ostomy and/or Continence Assessment and Care '[]'  - 0 Incontinence Assessment and Management '[]'  - 0 Ostomy Care Assessment and Management (repouching, etc.) PROCESS - Coordination of Care X - Simple Patient / Family Education for ongoing care 1 15 '[]'  - 0 Complex (extensive) Patient / Family Education for ongoing care X- 1 10 Staff obtains Programmer, systems, Records, T Results / Process Orders est '[]'  - 0 Staff telephones HHA, Nursing Homes / Clarify orders / etc '[]'  - 0 Routine Transfer to another Facility (non-emergent condition) '[]'  - 0 Routine Hospital Admission (non-emergent condition) '[]'  - 0 New Admissions / Biomedical engineer / Ordering NPWT Apligraf, etc. , '[]'  - 0 Emergency Hospital Admission (emergent condition) X- 1 10 Simple Discharge Coordination '[]'  - 0 Complex (extensive) Discharge Coordination PROCESS - Special Needs '[]'  - 0 Pediatric / Minor Patient Management '[]'  - 0 Isolation Patient Management '[]'  - 0 Hearing / Language / Visual special needs '[]'  - 0 Assessment of Community assistance (transportation, D/C planning, etc.) '[]'  - 0 Additional assistance / Altered  mentation '[]'  - 0 Support Surface(s) Assessment (bed, cushion, seat, etc.) INTERVENTIONS - Wound Cleansing / Measurement X - Simple Wound Cleansing - one wound  1 5 '[]'  - 0 Complex Wound Cleansing - multiple wounds X- 1 5 Wound Imaging (photographs - any number of wounds) '[]'  - 0 Wound Tracing (instead of photographs) X- 1 5 Simple Wound Measurement - one wound '[]'  - 0 Complex Wound Measurement - multiple wounds INTERVENTIONS - Wound Dressings X - Small Wound Dressing one or multiple wounds 1 10 '[]'  - 0 Medium Wound Dressing one or multiple wounds '[]'  - 0 Large Wound Dressing one or multiple wounds X- 1 5 Application of Medications - topical '[]'  - 0 Application of Medications - injection INTERVENTIONS - Miscellaneous '[]'  - 0 External ear exam '[]'  - 0 Specimen Collection (cultures, biopsies, blood, body fluids, etc.) '[]'  - 0 Specimen(s) / Culture(s) sent or taken to Lab for analysis '[]'  - 0 Patient Transfer (multiple staff / Civil Service fast streamer / Similar devices) '[]'  - 0 Simple Staple / Suture removal (25 or less) '[]'  - 0 Complex Staple / Suture removal (26 or more) '[]'  - 0 Hypo / Hyperglycemic Management (close monitor of Blood Glucose) '[]'  - 0 Ankle / Brachial Index (ABI) - do not check if billed separately X- 1 5 Vital Signs Has the patient been seen at the hospital within the last three years: Yes Total Score: 90 Level Of Care: New/Established - Level 3 Electronic Signature(s) Signed: 01/17/2022 11:59:37 AM By: Christian Hammock Murphy Entered By: [name] on 01/17/2022 09:37:09 -------------------------------------------------------------------------------- Encounter Discharge Information Details Patient Name: Date of Service: Christian Grout. 01/17/2022 9:15 A M Medical Record Number: [id number] Patient Account Number: [id number] Date of Birth/Sex: [name]: Jan 29, 1966 (55 y.o. [name] Primary Care [name]: [name] Other Clinician: Referring  [name]: Christian [name]/Extender: [name] in Treatment: 90 Encounter Discharge Information Items Discharge Condition: Stable Ambulatory Status: Ambulatory Discharge Destination: Home Transportation: Private Auto Accompanied By: self Schedule Follow-up Appointment: Yes Clinical Summary of Care: Patient Declined Electronic Signature(s) Signed: 01/17/2022 11:59:37 AM By: Christian Hammock Murphy Entered By: [name] on 01/17/2022 09:37:35 -------------------------------------------------------------------------------- Lower Extremity Assessment Details Patient Name: Date of Service: Christian Grout. 01/17/2022 9:15 A M Medical Record Number: [id number] Patient Account Number: [id number] Date of Birth/Sex: [name]: 04-26-1965 (56 y.o. [name] Primary Care Chandlar Staebell: [name] Other Clinician: Referring Cherene Dobbins: Christian Etheleen Valtierra/Extender: [name] in Treatment: 42 Electronic Signature(s) Signed: 01/17/2022 11:59:37 AM By: Christian Hammock Murphy Entered By: [name] on 01/17/2022 09:04:11 -------------------------------------------------------------------------------- Multi Wound Chart Details Patient Name: Date of Service: Christian Grout. 01/17/2022 9:15 A M Medical Record Number: [id number] Patient Account Number: [id number] Date of Birth/Sex: [name]: 24-Apr-1965 (56 y.o. [name] Primary Care Shahira Fiske: [name] Other Clinician: Referring Emmagene Ortner: Christian Nocole Zammit/Extender: [name] in Treatment: 42 Vital Signs Height(in): Pulse(bpm): 75 Weight(lbs): Blood Pressure(mmHg): 138/77 Body Mass Index(BMI): Temperature(F): 97.7 Respiratory Rate(breaths/min): 17 Photos: [N/A:N/A] Midline Back N/A N/A Wound Location: Surgical Injury N/A N/A Wounding Event: Open Surgical Wound N/A N/A Primary Etiology: Hypertension, Type II Diabetes, N/A N/A Comorbid  History: Osteomyelitis 01/11/2021 N/A N/A Date Acquired: 44 N/A N/A Murphy of Treatment: Open N/A N/A Wound Status: No N/A N/A Wound Recurrence: 3.5x0.3x0.3 N/A N/A Measurements L x W x D (cm) 0.825 N/A N/A A (cm) : rea 0.247 N/A N/A Volume (cm) : 98.90% N/A N/A % Reduction in Area: 99.90% N/A N/A % Reduction in Volume: Full Thickness With Exposed Support N/A N/A Classification: Structures Medium N/A N/A Exudate Amount: Serosanguineous N/A N/A Exudate Type: red, brown N/A  N/A Exudate Color: Distinct, outline attached N/A N/A Wound Margin: Large (67-100%) N/A N/A Granulation Amount: Red N/A N/A Granulation Quality: Small (1-33%) N/A N/A Necrotic Amount: Fat Layer (Subcutaneous Tissue): Yes N/A N/A Exposed Structures: Fascia: No Tendon: No Muscle: No Joint: No Bone: No Large (67-100%) N/A N/A Epithelialization: Treatment Notes Wound #1 (Back) Wound Laterality: Midline Cleanser Peri-Wound Care Skin Prep Discharge Instruction: Use skin prep as directed Topical Primary Dressing Endoform 2x2 in Discharge Instruction: Moisten with saline Secondary Dressing ADAPTIC TOUCH 3x4.25 in Discharge Instruction: Applied over primary dressing secured with steri-strips as directed. Woven Gauze Sponge, Non-Sterile 4x4 in Discharge Instruction: Apply over primary dressing as directed. Zetuvit Plus Silicone Border Dressing 7x7(in/in) Discharge Instruction: Apply silicone border over primary dressing as directed. Secured With Compression Wrap Compression Stockings Add-Ons Electronic Signature(s) Signed: 01/17/2022 11:59:37 AM By: Christian Hammock Murphy Signed: 01/17/2022 12:14:24 PM By: Kalman Shan DO Entered By: Kalman Shan on 01/17/2022 09:51:59 -------------------------------------------------------------------------------- Multi-Disciplinary Care Plan Details Patient Name: Date of Service: Christian Grout. 01/17/2022 9:15 A M Medical Record Number:  [id number] Patient Account Number: [id number] Date of Birth/Sex: [name]: 10/02/1965 (55 y.o. [name] Primary Care Shelbia Scinto: [name] Other Clinician: Referring Rik Wadel: Christian Donna Snooks/Extender: [name] in Treatment: [place] reviewed with physician Active Inactive Wound/Skin Impairment Nursing Diagnoses: Impaired tissue integrity Goals: Patient/caregiver will verbalize understanding of skin care regimen Date Initiated: 03/28/2021 Target Resolution Date: 01/18/2022 Goal Status: Active Ulcer/skin breakdown will have a volume reduction of 30% by week 4 Date Initiated: 03/28/2021 Date Inactivated: 05/27/2021 Target Resolution Date: 05/31/2021 Goal Status: Met Interventions: Assess patient/caregiver ability to obtain necessary supplies Assess patient/caregiver ability to perform ulcer/skin care regimen upon admission and as needed Assess ulceration(s) every visit Provide education on ulcer and skin care Treatment Activities: Topical wound management initiated : 03/28/2021 Notes: Electronic Signature(s) Signed: 01/17/2022 11:59:37 AM By: Christian Hammock Murphy Entered By: [name] on 01/17/2022 09:35:07 -------------------------------------------------------------------------------- Pain Assessment Details Patient Name: Date of Service: Christian Grout. 01/17/2022 9:15 A M Medical Record Number: [id number] Patient Account Number: [id number] Date of Birth/Sex: [name]: 12-28-1965 (56 y.o. [name] Primary Care Dajanee Voorheis: [name] Other Clinician: Referring Brogan England: Christian Perris Conwell/Extender: [name] in Treatment: 42 Active Problems Location of Pain Severity and Description of Pain Patient Has Paino No Site Locations Pain Management and Medication Current Pain Management: Electronic Signature(s) Signed: 01/17/2022 11:59:37 AM By: Christian Hammock Murphy Entered  By: [name] on 01/17/2022 09:04:07 -------------------------------------------------------------------------------- Patient/Caregiver Education Details Patient Name: Date of Service: [place], [name] 10/6/2023andnbsp9:15 A M Medical Record Number: [id number] Patient Account Number: [id number] Date of Birth/Gender: [name]: Jun 30, 1965 (56 y.o. [name] Primary Care Physician: [name] Other Clinician: Referring Physician: Treating Physician/Extender: [name] in Treatment: 31 Education Assessment Education Provided To: Patient Education Topics Provided Wound/Skin Impairment: Methods: Explain/Verbal Responses: Reinforcements needed, State content correctly Electronic Signature(s) Signed: 01/17/2022 11:59:37 AM By: Christian Hammock Murphy Entered By: [name] on 01/17/2022 09:35:44 -------------------------------------------------------------------------------- Wound Assessment Details Patient Name: Date of Service: Christian Grout. 01/17/2022 9:15 A M Medical Record Number: [id number] Patient Account Number: [id number] Date of Birth/Sex: [name]: Nov 25, 1965 (56 y.o. [name] Primary Care Keefe Zawistowski: [name] Other Clinician: Referring Heath Tesler: Christian Brayla Pat/Extender: [name] in Treatment: 42 Wound Status Wound Number: 1 Primary Etiology: Open Surgical Wound Wound Location: Midline Back Wound Status: Open Wounding Event: Surgical Injury Comorbid History: Hypertension,  Type II Diabetes, Osteomyelitis Date Acquired: 01/11/2021 Murphy Of Treatment: 42 Clustered Wound: No Photos Wound Measurements Length: (cm) 3.5 Width: (cm) 0.3 Depth: (cm) 0.3 Area: (cm) 0.825 Volume: (cm) 0.247 % Reduction in Area: 98.9% % Reduction in Volume: 99.9% Epithelialization: Large (67-100%) Tunneling: No Undermining: No Wound Description Classification: Full Thickness With Exposed Support  Structures Wound Margin: Distinct, outline attached Exudate Amount: Medium Exudate Type: Serosanguineous Exudate Color: red, brown Foul Odor After Cleansing: No Slough/Fibrino Yes Wound Bed Granulation Amount: Large (67-100%) Exposed Structure Granulation Quality: Red Fascia Exposed: No Necrotic Amount: Small (1-33%) Fat Layer (Subcutaneous Tissue) Exposed: Yes Necrotic Quality: Adherent Slough Tendon Exposed: No Muscle Exposed: No Joint Exposed: No Bone Exposed: No Periwound Skin Texture Texture Color No Abnormalities Noted: No No Abnormalities Noted: No Moisture No Abnormalities Noted: No Treatment Notes Wound #1 (Back) Wound Laterality: Midline Cleanser Peri-Wound Care Skin Prep Discharge Instruction: Use skin prep as directed Topical Primary Dressing Endoform 2x2 in Discharge Instruction: Moisten with saline Secondary Dressing ADAPTIC TOUCH 3x4.25 in Discharge Instruction: Applied over primary dressing secured with steri-strips as directed. Woven Gauze Sponge, Non-Sterile 4x4 in Discharge Instruction: Apply over primary dressing as directed. Zetuvit Plus Silicone Border Dressing 7x7(in/in) Discharge Instruction: Apply silicone border over primary dressing as directed. Secured With Compression Wrap Compression Stockings Environmental education officer) Signed: 01/17/2022 11:59:37 AM By: Christian Hammock Murphy Entered By: [name] on 01/17/2022 09:19:24 -------------------------------------------------------------------------------- Vitals Details Patient Name: Date of Service: [name] 01/17/2022 9:15 A M Medical Record Number: [id number] Patient Account Number: [id number] Date of Birth/Sex: [name]: [birth year] (56 y.o. [name] Primary Care Evaan Tidwell: [name] Other Clinician: Referring Lachanda Buczek: Christian Josias Tomerlin/Extender: [name] in Treatment: 42 Vital Signs Time Taken: 09:03 Temperature (F):  97.7 Pulse (bpm): 75 Respiratory Rate (breaths/min): 17 Blood Pressure (mmHg): 138/77 Reference Range: 80 - 120 mg / dl Electronic Signature(s) Signed: 01/17/2022 11:59:37 AM By: Christian Hammock Murphy Entered By: [name] on 01/17/2022 09:13:52

## 2022-01-21 ENCOUNTER — Other Ambulatory Visit (HOSPITAL_COMMUNITY): Payer: Self-pay

## 2022-01-23 ENCOUNTER — Other Ambulatory Visit (HOSPITAL_COMMUNITY): Payer: Self-pay

## 2022-01-23 ENCOUNTER — Encounter: Payer: Self-pay | Admitting: Internal Medicine

## 2022-01-23 DIAGNOSIS — I10 Essential (primary) hypertension: Secondary | ICD-10-CM

## 2022-01-23 MED ORDER — LOSARTAN POTASSIUM-HCTZ 100-12.5 MG PO TABS
1.0000 | ORAL_TABLET | Freq: Every day | ORAL | 0 refills | Status: DC
  Filled 2022-01-23 – 2022-02-05 (×2): qty 90, 90d supply, fill #0

## 2022-01-23 NOTE — Addendum Note (Signed)
Addended byDamita Dunnings D on: 01/23/2022 02:11 PM   Modules accepted: Orders

## 2022-01-23 NOTE — Telephone Encounter (Signed)
Send Hyzaar 100-12.5 mg 1 tablet daily 34-monthsupply. Arrange a BMP in 2 weeks.

## 2022-01-24 ENCOUNTER — Other Ambulatory Visit (HOSPITAL_COMMUNITY): Payer: Self-pay

## 2022-01-24 ENCOUNTER — Encounter (HOSPITAL_BASED_OUTPATIENT_CLINIC_OR_DEPARTMENT_OTHER): Payer: No Typology Code available for payment source | Admitting: Internal Medicine

## 2022-01-24 DIAGNOSIS — L98424 Non-pressure chronic ulcer of back with necrosis of bone: Secondary | ICD-10-CM | POA: Diagnosis not present

## 2022-01-24 DIAGNOSIS — E11622 Type 2 diabetes mellitus with other skin ulcer: Secondary | ICD-10-CM | POA: Diagnosis not present

## 2022-01-27 ENCOUNTER — Other Ambulatory Visit (HOSPITAL_COMMUNITY): Payer: Self-pay

## 2022-01-30 ENCOUNTER — Telehealth: Payer: Self-pay | Admitting: *Deleted

## 2022-01-30 NOTE — Telephone Encounter (Signed)
Received on (01/29/22) via of fax Medical Management Request from [facility] requesting recent visit notes for continued case management review.    Faxed recent visit notes to [facility].  Confirmation received and copy scanned into the chart.//AB/CMA

## 2022-01-30 NOTE — Progress Notes (Signed)
JAIEL, SARACENO ([id number]) [id number] Page 1 of 10 Visit Report for 01/24/2022 Chief Complaint Document Details Patient Name: Date of Service: [name], [name] 01/24/2022 9:15 A M Medical Record Number: [id number] Patient Account Number: [id number] Date of Birth/Sex: Treating RN: June 10, 1965 (56 y.o. [name] Primary Care Provider: [name] Other Clinician: Referring Provider: Treating Provider/Extender: [name] Weeks in Treatment: 56 Information Obtained from: Patient Chief Complaint 03/28/2021; Back wound Electronic Signature(s) Signed: 01/24/2022 11:07:44 AM By: Kalman Shan DO Entered By: Kalman Shan on 01/24/2022 10:27:44 -------------------------------------------------------------------------------- Cellular or Tissue Based Product Details Patient Name: Date of Service: [name], KIENER 01/24/2022 9:15 A M Medical Record Number: [id number] Patient Account Number: [id number] Date of Birth/Sex: Treating RN: [birth year] (56 y.o. [name], [name] Primary Care Provider: [name] Other Clinician: Referring Provider: Treating Provider/Extender: [name] in Treatment: 45 Cellular or Tissue Based Product Type Wound #1 Midline Back Applied to: Performed By: Physician Kalman Shan, DO Cellular or Tissue Based Product Type: Epicord Level of Consciousness (Pre-procedure): Awake and Alert Pre-procedure Verification/Time Out Yes - 10:18 Taken: Location: trunk / arms / legs Wound Size (sq cm): 0.6 Product Size (sq cm): 6 Waste Size (sq cm): 3 Waste Reason: wound size Amount of Product Applied (sq cm): 3 Instrument Used: Curette, Scissors Lot #: [id number] Expiration Date: 09/13/2026 Fenestrated: No Reconstituted: Yes Solution Type: Normal Saline Solution Amount: 58m Lot #: [id number] Expiration Date: 08/11/2024 Secured: Yes Secured With: Steri-Strips Dressing Applied:  Yes Primary Dressing: Adaptic Procedural Pain: 0 Post Procedural Pain: 0 Response to Treatment: Procedure was tolerated well Level of Consciousness (Post- Awake and Alert MDEIGO, ALONSO([id number] Page 2 of 10 procedure): Post Procedure Diagnosis Same as Pre-procedure Electronic Signature(s) Signed: 01/24/2022 11:07:44 AM By: HKalman ShanDO Signed: 01/30/2022 4:05:37 PM By: BRhae HammockRN Entered By: BRhae Hammockon 01/24/2022 10:20:34 -------------------------------------------------------------------------------- Debridement Details Patient Name: Date of Service: [name] 01/24/2022 9:15 A M Medical Record Number: [id number] Account Number: [id number] of Birth/Sex: Treating RN: 309-03-1966(56y.o. MBurnadette Pop [name] Primary Care Provider: PKathlene NovemberOther Clinician: Referring Provider: Treating Provider/Extender: HFreddi StarrWeeks in Treatment: 43 Debridement Performed for Assessment: Wound #1 Midline Back Performed By: Physician HKalman Shan DO Debridement Type: Debridement Level of Consciousness (Pre-procedure): Awake and Alert Pre-procedure Verification/Time Out Yes - 10:10 Taken: Start Time: 10:10 Pain Control: Lidocaine T Area Debrided (L x W): otal 3 (cm) x 0.2 (cm) = 0.6 (cm) Tissue and other material debrided: Viable, Non-Viable, Slough, Subcutaneous, Slough Level: Skin/Subcutaneous Tissue Debridement Description: Excisional Instrument: Curette Bleeding: Minimum Hemostasis Achieved: Pressure End Time: 10:10 Procedural Pain: 0 Post Procedural Pain: 0 Response to Treatment: Procedure was tolerated well Level of Consciousness (Post- Awake and Alert procedure): Post Debridement Measurements of Total Wound Length: (cm) 3 Width: (cm) 0.2 Depth: (cm) 0.3 Volume: (cm) 0.141 Character of Wound/Ulcer Post Debridement: Improved Post Procedure Diagnosis Same as  Pre-procedure Electronic Signature(s) Signed: 01/24/2022 11:07:44 AM By: HKalman ShanDO Signed: 01/30/2022 4:05:37 PM By: BRhae HammockRN Entered By: BRhae Hammockon 01/24/2022 10:17:16 -------------------------------------------------------------------------------- HPI Details Patient Name: Date of Service: [name] 01/24/2022 9:15 A MDanne Baxter([id number] Page 3 of 10 Medical Record Number: [id number] Account Number: [id number] of Birth/Sex: Treating RN: [birth year](56y.o. MErie NoePrimary Care Provider: PKathlene NovemberOther Clinician: Referring Provider: Treating Provider/Extender: HFreddi StarrWeeks in Treatment: 410History of  Present Illness HPI Description: Admission 03/28/2021 [name] is a 56 year old male with a past medical history of controlled type 2 diabetes on oral agents, obesity and OSA that presents to the clinic for a back wound. On 01/11/2021 patient had a laminectomy with PLIF of L1-S1 by Dr. Venetia Constable because of lumbar stenosis and radiculopathy. He subsequently developed bacteremia. He had CT imaging on 10/13 of the lumbar spine that showed fluid collection in the soft tissue of the posterior L1 and S1 and was taken to the OR for washout on 10/14. 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 He received 4 weeks of IV antibiotics by infectious disease. After his completion of 4 weeks of IV antibiotics he was continued for an additional 4 weeks of IV cefazolin with a stop date of 12/29. He has been evaluated by plastic surgery and no plans for surgical intervention at this time. Wife is present and reports he has been on the wound VAC for the past 8 weeks with improvement in wound healing. He currently denies systemic signs of infection. 12/22; patient presents for follow-up. He reports no issues since last clinic visit. He denies signs of  infection. He has been tolerating the wound VAC well. 12/30; patient presents for follow-up. He reports no issues and has no complaints today. He has been tolerating the wound VAC well. 1/9; patient presents for follow-up. He has no issues or complaints today. He states he feels well. He has had no problems with the wound VAC. 1/16; patient presents for follow-up. He continues to use the wound VAC with no issues. He denies signs of infection. 1/23; patient presents for follow-up. He has been switched from IV cefazolin to oral cefadroxil by infectious disease. He has no issues or complaints today. He denies signs of infection. He continues to tolerate the wound VAC well. 1/30; patient presents for follow-up. He continues to tolerate the wound VAC well. 2/6; patient presents for follow-up. He has no issues or complaints today. He continues to tolerate the wound VAC well. He denies signs of infection. 2/13; patient presents for follow-up. He continues to do well with the wound VAC. He denies any issues. 2/27; patient presents for follow-up. He continues to use the wound VAC without any issues. He denies signs of infection. 3/20; patient presents for follow-up. He has no issues or complaints today. He continues to use the wound VAC. 4/3; patient presents for follow-up. He continues to use the wound VAC without issues. He denies signs of infection. 4/17; 2-week follow-up. He continues to do well. His measurements are improved. Initially a surgical wound complicated by infection. 5/1; patient presents for follow-up. He has no issues or complaints today. He continues to tolerate the wound VAC well. He denies signs of infection. 5/15; patient presents for follow-up. He has noted more maceration to the periwound. He has been using the wound VAC without issues. He currently denies signs of infection. 5/30; patient presents for follow-up. He has been tolerating the wound VAC well over the past 2 weeks. He no  longer has maceration to the periwound. He has no issues or complaints today. 6/8; this patient with a postsurgical wound that was complicated by postop infection. He has been using silver collagen under wound VAC and gradually doing well improvement in dimensions especially the tunnel at 12:00 6/22; patient presents for follow-up. We have been using silver collagen under the wound VAC. Patient has no issues or questions today. 7/6; patient presents  for follow-up. Patient continues to use collagen under the wound VAC with no issues. 7/20; patient presents for follow-up. He has been using collagen under the wound VAC with no issues. He has been approved for Epicord. This was discussed with the patient and he is in agreement to having this placed at next clinic visit. 7/27; patient presents for follow-up. He has been tolerating the wound VAC well with collagen underneath. He has been approved for epi cord and we have this to place in office today. 8/4; patient presents for follow-up. He had Epicort placed at last clinic visit. We held off on the wound VAC. He tolerated this well. He has no issues or complaints today. 8/11; patient presents for follow-up. Epicord was placed at last clinic visit. He had the wound VAC on for the past week. He states that no drainage was suctioned into the canister. He has slight maceration to the periwound. 8/18; patient presents for follow-up. Epicord #3 was placed in standard fashion last week. The wound VAC was held. He has no issues or complaints today. 8/25; Patient presents for follow-up. Epicord #4 was placed in standard fashion last week. The wound VAC was held again. He has no issues or complaints today. There has been improvement in wound healing. 9/1; patient presents for follow-up. Epicort #5 was placed in standard fashion last week. He has no issues or complaints today. 9/8; patient presents for follow-up. Epicord #6 was placed in standard fashion last  week. He tolerated this well and has no issues or complaints today. 9/15; Epicort No. 7 the wound looks healthy. Perhaps some hyper granulated tissue in the lower 50% of the longitudinal wound 9/22; Epicord #8 placed at last clinic visit. Wound appears well-healing. Patient has no issues or complaints today. 9/28. Epicord No. 9. Continued improvement. 10/6; patient has completed the allotted epicord Applications that insurance would cover. Insurance denied extending more applications. Patient has no issues or complaints today. He tolerated the last application well. 10/13; patient presents for follow-up. We have been using endoform to the wound bed over the past week. He has been approved for 5 more applications of DAUNTE, OESTREICH ([id number]) [phone].pdf Page 4 of 10 Epicord. Electronic Signature(s) Signed: 01/24/2022 11:07:44 AM By: Kalman Shan DO Entered By: Kalman Shan on 01/24/2022 10:28:14 -------------------------------------------------------------------------------- Physical Exam Details Patient Name: Date of Service: Christian Brown E. 01/24/2022 9:15 A M Medical Record Number: [id number] Patient Account Number: [id number] Date of Birth/Sex: Treating RN: [birth year] (56 y.o. [name] Primary Care Provider: [name] Other Clinician: Referring Provider: Treating Provider/Extender: [name] Weeks in Treatment: 63 Constitutional respirations regular, non-labored and within target range for patient.Marland Kitchen Psychiatric pleasant and cooperative. Notes Wound exam; lumbar spine surgical incision site. Small tunnel at the 12 o'clock position about the size of a #3 curette. He has granulation tissue in all wound areas no signs of infection. Electronic Signature(s) Signed: 01/24/2022 11:07:44 AM By: Kalman Shan DO Entered By: Kalman Shan on 01/24/2022  10:28:53 -------------------------------------------------------------------------------- Physician Orders Details Patient Name: Date of Service: Christian Brown E. 01/24/2022 9:15 A M Medical Record Number: [id number] Patient Account Number: [id number] Date of Birth/Sex: Treating RN: Jun 19, 1965 (56 y.o. [name] Primary Care Provider: [name] Other Clinician: Referring Provider: Treating Provider/Extender: [name] Weeks in Treatment: 71 Verbal / Phone Orders: No Diagnosis Coding Follow-up Appointments ppointment in 1 week. - Friday Dr. Heber [place] and Allayne Butcher, Rm # 9 Return A ppointment in 2 weeks. - Friday  Dr. Heber [place] and Allayne Butcher, [place] # 9 Return A Anesthetic (In clinic) Topical Lidocaine 5% applied to wound bed Cellular or Tissue Based Products Cellular or Tissue Based Product Type: - Epicord #3 applied 11/22/21 Epicord #4 11/29/21 Epicord #5 12/06/2021 Epicord #6 12/13/21 Epicord # 7 12/20/21 Epicord # 8 12/27/21 Epicord # 9 01/03/22 Epicord # 10 01/09/22 Epicord # 11 01/24/22 daptic or Mepitel. (DO NOT REMOVE). - leave Cellular or Tissue Based Product applied to wound bed, secured with steri-strips, cover with A the adaptic and steri-strips in place. CHARLTON, BOULE ([id number]) [id number] Page 5 of 10 Bathing/ Shower/ Hygiene May shower with protection but do not get wound dressing(s) wet. Negative Presssure Wound Therapy Discontinue wound vac - Patient to call [facility] to pick up wound vac. Additional Orders / Instructions Follow Nutritious Diet - Continue to monitor blood sugars daily Wound Treatment Wound #1 - Back Wound Laterality: Midline Peri-Wound Care: Skin Prep Every Other Day/30 Days Discharge Instructions: Use skin prep as directed Secondary Dressing: ADAPTIC TOUCH 3x4.25 in Every Other Day/30 Days Discharge Instructions: Applied over primary dressing secured with steri-strips as directed. Secondary Dressing:  Woven Gauze Sponge, Non-Sterile 4x4 in Every Other Day/30 Days Discharge Instructions: Apply over primary dressing as directed. Secondary Dressing: Zetuvit Plus Silicone Border Dressing 7x7(in/in) (Generic) Every Other Day/30 Days Discharge Instructions: Apply silicone border over primary dressing as directed. Electronic Signature(s) Signed: 01/24/2022 11:07:44 AM By: Kalman Shan DO Entered By: Kalman Shan on 01/24/2022 10:30:09 -------------------------------------------------------------------------------- Problem List Details Patient Name: Date of Service: [name]. 01/24/2022 9:15 A M Medical Record Number: [id number] Patient Account Number: [id number] Date of Birth/Sex: Treating RN: 1965-07-05 (56 y.o. 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 03/28/2021 No Yes M86.9 Osteomyelitis, unspecified 03/28/2021 No Yes E11.622 Type 2 diabetes mellitus with other skin ulcer 03/28/2021 No Yes Inactive Problems Resolved Problems Electronic Signature(s) Signed: 01/24/2022 11:07:44 AM By: Kalman Shan DO Entered By: Kalman Shan on 01/24/2022 10:27:32 Rolly Salter ([id number]) [id number] Page 6 of 10 -------------------------------------------------------------------------------- Progress Note Details Patient Name: Date of Service: ANDRIY, SHERK 01/24/2022 9:15 A M Medical Record Number: [id number] Patient Account Number: [id number] Date of Birth/Sex: Treating RN: 05-29-1965 (56 y.o. [name] Primary Care Provider: [name] Other Clinician: Referring Provider: Treating Provider/Extender: [name] Weeks in Treatment: 25 Subjective Chief  Complaint Information obtained from Patient 03/28/2021; Back wound History of Present Illness (HPI) Admission 03/28/2021 Mr. Tae Vonada is a 56 year old male with a past medical history of controlled type 2 diabetes on oral agents, obesity and OSA that presents to the clinic for a back wound. On 01/11/2021 patient had a laminectomy with PLIF of L1-S1 by Dr. Venetia Constable because of lumbar stenosis and radiculopathy. He subsequently developed bacteremia. He had CT imaging on 10/13 of the lumbar spine that showed fluid collection in the soft tissue of the posterior L1 and S1 and was taken to the OR for washout on 10/14. 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 He received 4 weeks of IV antibiotics by infectious disease. After his completion of 4 weeks of IV antibiotics he was continued for an additional 4 weeks of IV cefazolin with a stop date of 12/29. He has been evaluated by plastic surgery and no plans for  surgical intervention at this time. Wife is present and reports he has been on the wound VAC for the past 8 weeks with improvement in wound healing. He currently denies systemic signs of infection. 12/22; patient presents for follow-up. He reports no issues since last clinic visit. He denies signs of infection. He has been tolerating the wound VAC well. 12/30; patient presents for follow-up. He reports no issues and has no complaints today. He has been tolerating the wound VAC well. 1/9; patient presents for follow-up. He has no issues or complaints today. He states he feels well. He has had no problems with the wound VAC. 1/16; patient presents for follow-up. He continues to use the wound VAC with no issues. He denies signs of infection. 1/23; patient presents for follow-up. He has been switched from IV cefazolin to oral cefadroxil by infectious disease. He has no issues or complaints today. He denies signs of infection. He continues to tolerate the wound VAC  well. 1/30; patient presents for follow-up. He continues to tolerate the wound VAC well. 2/6; patient presents for follow-up. He has no issues or complaints today. He continues to tolerate the wound VAC well. He denies signs of infection. 2/13; patient presents for follow-up. He continues to do well with the wound VAC. He denies any issues. 2/27; patient presents for follow-up. He continues to use the wound VAC without any issues. He denies signs of infection. 3/20; patient presents for follow-up. He has no issues or complaints today. He continues to use the wound VAC. 4/3; patient presents for follow-up. He continues to use the wound VAC without issues. He denies signs of infection. 4/17; 2-week follow-up. He continues to do well. His measurements are improved. Initially a surgical wound complicated by infection. 5/1; patient presents for follow-up. He has no issues or complaints today. He continues to tolerate the wound VAC well. He denies signs of infection. 5/15; patient presents for follow-up. He has noted more maceration to the periwound. He has been using the wound VAC without issues. He currently denies signs of infection. 5/30; patient presents for follow-up. He has been tolerating the wound VAC well over the past 2 weeks. He no longer has maceration to the periwound. He has no issues or complaints today. 6/8; this patient with a postsurgical wound that was complicated by postop infection. He has been using silver collagen under wound VAC and gradually doing well improvement in dimensions especially the tunnel at 12:00 6/22; patient presents for follow-up. We have been using silver collagen under the wound VAC. Patient has no issues or questions today. 7/6; patient presents for follow-up. Patient continues to use collagen under the wound VAC with no issues. 7/20; patient presents for follow-up. He has been using collagen under the wound VAC with no issues. He has been approved for Epicord.  This was discussed with the patient and he is in agreement to having this placed at next clinic visit. 7/27; patient presents for follow-up. He has been tolerating the wound VAC well with collagen underneath. He has been approved for epi cord and we have this to place in office today. 8/4; patient presents for follow-up. He had Epicort placed at last clinic visit. We held off on the wound VAC. He tolerated this well. He has no issues or complaints today. 8/11; patient presents for follow-up. Epicord was placed at last clinic visit. He had the wound VAC on for the past week. He states that no drainage was suctioned into the canister. He has slight  maceration to the periwound. 8/18; patient presents for follow-up. Epicord #3 was placed in standard fashion last week. The wound VAC was held. He has no issues or complaints today. AYVION, KAVANAGH ([id number]) [id number] Page 7 of 10 8/25; Patient presents for follow-up. Epicord #4 was placed in standard fashion last week. The wound VAC was held again. He has no issues or complaints today. There has been improvement in wound healing. 9/1; patient presents for follow-up. Epicort #5 was placed in standard fashion last week. He has no issues or complaints today. 9/8; patient presents for follow-up. Epicord #6 was placed in standard fashion last week. He tolerated this well and has no issues or complaints today. 9/15; Epicort No. 7 the wound looks healthy. Perhaps some hyper granulated tissue in the lower 50% of the longitudinal wound 9/22; Epicord #8 placed at last clinic visit. Wound appears well-healing. Patient has no issues or complaints today. 9/28. Epicord No. 9. Continued improvement. 10/6; patient has completed the allotted epicord Applications that insurance would cover. Insurance denied extending more applications. Patient has no issues or complaints today. He tolerated the last application well. 10/13; patient presents  for follow-up. We have been using endoform to the wound bed over the past week. He has been approved for 5 more applications of Epicord. Patient History Information obtained from Patient. Family History Diabetes - Mother, Stroke - Siblings, No family history of Cancer, Heart Disease, Hereditary Spherocytosis, Hypertension, Kidney Disease, Lung Disease, Seizures, Thyroid Problems, Tuberculosis. Social History Never smoker, Marital Status - Married, Alcohol Use - Rarely, Drug Use - No History, Caffeine Use - Rarely. Medical History Cardiovascular Patient has history of Hypertension Endocrine Patient has history of Type II Diabetes Musculoskeletal Patient has history of Osteomyelitis Medical A Surgical History Notes nd Musculoskeletal DDD Objective Constitutional respirations regular, non-labored and within target range for patient.. Vitals Time Taken: 9:13 AM, Temperature: 97.9 F, Pulse: 106 bpm, Respiratory Rate: 18 breaths/min, Blood Pressure: 113/77 mmHg. Psychiatric pleasant and cooperative. General Notes: Wound exam; lumbar spine surgical incision site. Small tunnel at the 12 o'clock position about the size of a #3 curette. He has granulation tissue in all wound areas no signs of infection. Integumentary (Hair, Skin) Wound #1 status is Open. Original cause of wound was Surgical Injury. The date acquired was: 01/11/2021. The wound has been in treatment 43 weeks. The wound is located on the Midline Back. The wound measures 3cm length x 0.2cm width x 0.3cm depth; 0.471cm^2 area and 0.141cm^3 volume. There is Fat Layer (Subcutaneous Tissue) exposed. There is no tunneling or undermining noted. There is a medium amount of serosanguineous drainage noted. The wound margin is distinct with the outline attached to the wound base. There is large (67-100%) red granulation within the wound bed. There is no necrotic tissue within the wound bed. The periwound skin appearance did not exhibit:  Callus, Crepitus, Excoriation, Induration, Rash, Scarring, Dry/Scaly, Maceration, Atrophie Blanche, Cyanosis, Ecchymosis, Hemosiderin Staining, Mottled, Pallor, Rubor, Erythema. Periwound temperature was noted as No Abnormality. 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 I debrided nonviable tissue. No signs of infection. Patient has been approved for epi cord again and this was placed in standard fashion today. Procedures Wound #1 Pre-procedure diagnosis of Wound #1 is an Open Surgical Wound located on the Midline Back . There was a Excisional Skin/Subcutaneous Tissue  Debridement with a total area of 0.6 sq cm performed by Kalman Shan, DO. With the following instrument(s): Curette to remove Viable and Non-Viable tissue/material. Material removed includes Subcutaneous Tissue and Slough and after achieving pain control using Lidocaine. No specimens were taken. A time out was conducted at 10:10, prior to the start of the procedure. A Minimum amount of bleeding was controlled with Pressure. The procedure was tolerated well with a pain level of 0 throughout and a pain level of 0 following the procedure. Post Debridement Measurements: 3cm length x 0.2cm width x 0.3cm depth; 0.141cm^3 volume. Character of Wound/Ulcer Post Debridement is improved. Post procedure Diagnosis Wound #1: Same as Pre-Procedure Pre-procedure diagnosis of Wound #1 is an Open Surgical Wound located on the Midline Back. A skin graft procedure using a bioengineered skin substitute/cellular or tissue based product was performed by Kalman Shan, DO with the following instrument(s): Curette and Scissors. Epicord was applied and secured with Steri-Strips. 3 sq cm of product was utilized  and 3 sq cm was wasted due to wound size. Post Application, Adaptic was applied. A Time Out was conducted at 10:18, prior to the start of the procedure. The procedure was tolerated well with a pain level of 0 throughout and a pain level of 0 following the procedure. Post procedure Diagnosis Wound #1: Same as Pre-Procedure . Plan Follow-up Appointments: Return Appointment in 1 week. - Friday Dr. Heber Fowlerton and Allayne Butcher, Rm # 9 Return Appointment in 2 weeks. - Friday Dr. Heber Campo and Allayne Butcher, Rm # 9 Anesthetic: (In clinic) Topical Lidocaine 5% applied to wound bed Cellular or Tissue Based Products: Cellular or Tissue Based Product Type: - Epicord #3 applied 11/22/21 Epicord #4 11/29/21 Epicord #5 12/06/2021 Epicord #6 12/13/21 Epicord # 7 12/20/21 Epicord # 8 12/27/21 Epicord # 9 01/03/22 Epicord # 10 01/09/22 Epicord # 11 01/24/22 Cellular or Tissue Based Product applied to wound bed, secured with steri-strips, cover with Adaptic or Mepitel. (DO NOT REMOVE). - leave the adaptic and steri-strips in place. Bathing/ Shower/ Hygiene: May shower with protection but do not get wound dressing(s) wet. Negative Presssure Wound Therapy: Discontinue wound vac - Patient to call [facility] to pick up wound vac. Additional Orders / Instructions: Follow Nutritious Diet - Continue to monitor blood sugars daily WOUND #1: - Back Wound Laterality: Midline Peri-Wound Care: Skin Prep Every Other Day/30 Days Discharge Instructions: Use skin prep as directed Secondary Dressing: ADAPTIC TOUCH 3x4.25 in Every Other Day/30 Days Discharge Instructions: Applied over primary dressing secured with steri-strips as directed. Secondary Dressing: Woven Gauze Sponge, Non-Sterile 4x4 in Every Other Day/30 Days Discharge Instructions: Apply over primary dressing as directed. Secondary Dressing: Zetuvit Plus Silicone Border Dressing 7x7(in/in) (Generic) Every Other Day/30 Days Discharge Instructions: Apply silicone border over primary  dressing as directed. 1. In office sharp debridement 2. Epicord # 11 placed in standard fashion 3. Follow-up in 1 week Electronic Signature(s) Signed: 01/24/2022 11:07:44 AM By: Kalman Shan DO Entered By: Kalman Shan on 01/24/2022 10:31:18 -------------------------------------------------------------------------------- HxROS Details Patient Name: Date of Service: Christian Brown E. 01/24/2022 9:15 A M Medical Record Number: [id number] Patient Account Number: [id number] Date of Birth/Sex: Treating RN: 07-31-1965 (56 y.o. Chales Salmon, Ofilia Neas ([id number]) [id number] Page 9 of 10 Primary Care Provider: [name] Other Clinician: Referring Provider: Treating Provider/Extender: [name] in Treatment: 67 Information Obtained From Patient Cardiovascular Medical History: Positive for: Hypertension Endocrine Medical History: Positive for: Type II Diabetes Time with diabetes: Since mid 90's Treated with:  Oral agents Blood sugar tested every day: Yes Tested : 2x day Musculoskeletal Medical History: Positive for: Osteomyelitis Past Medical History Notes: DDD Immunizations Pneumococcal Vaccine: Received Pneumococcal Vaccination: Yes Received Pneumococcal Vaccination On or After 60th Birthday: No Implantable Devices Yes Family and Social History Cancer: No; Diabetes: Yes - Mother; Heart Disease: No; Hereditary Spherocytosis: No; Hypertension: No; Kidney Disease: No; Lung Disease: No; Seizures: No; Stroke: Yes - Siblings; Thyroid Problems: No; Tuberculosis: No; Never smoker; Marital Status - Married; Alcohol Use: Rarely; Drug Use: No History; Caffeine Use: Rarely; Financial Concerns: No; Food, Clothing or Shelter Needs: No; Support System Lacking: No; Transportation Concerns: No Electronic Signature(s) Signed: 01/24/2022 11:07:44 AM By: Kalman Shan DO Signed: 01/30/2022 4:05:37 PM By: [name]  RN Entered By: Kalman Shan on 01/24/2022 10:28:19 -------------------------------------------------------------------------------- SuperBill Details Patient Name: Date of Service: [name]. 01/24/2022 Medical Record Number: [id number] Patient Account Number: [id number] Date of Birth/Sex: Treating RN: July 28, 1965 (56 y.o. 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 M86.9 Osteomyelitis, unspecified E11.622 Type 2 diabetes mellitus with other skin ulcer Facility Procedures : KHIRY, PASQUARIELLO Code: [name] ([id number]) [id number] Q4 Description: [id number] [place] 2cm x 3cm - per sqcm Modifier: [id number] Quantity: 27.pdf Page 10 of 10 : CPT4 Code: [id number] IC L Description: 47 - SKIN SUB GRAFT TRNK/ARM/LEG D-10 Diagnosis Description 98.424 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

## 2022-01-30 NOTE — Progress Notes (Signed)
Christian, Murphy ([id number]) [id number] Page 1 of 7 Visit Report for 01/24/2022 Arrival Information Details Patient Name: Date of Service: [name], [name] 01/24/2022 9:15 A M Medical Record Number: [id number] Patient Account Number: [id number] Date of Birth/Sex: Treating RN: November 13, 1965 (56 y.o. [name], [name] Primary Care Lumen Brinlee: [name] Other Clinician: Referring Yvonnie Schinke: Treating Maciej Schweitzer/Extender: [name] in Treatment: 24 Visit Information History Since Last Visit Added or deleted any medications: No Patient Arrived: Ambulatory Any new allergies or adverse reactions: No Arrival Time: 09:11 Had a fall or experienced change in No Accompanied By: self activities of daily living that may affect Transfer Assistance: None risk of falls: Patient Requires Transmission-Based Precautions: No Signs or symptoms of abuse/neglect since last visito No Patient Has Alerts: Yes Hospitalized since last visit: No Patient Alerts: Patient on Blood Thinner Implantable device outside of the clinic excluding No No BP Right Arm-PICC cellular tissue based products placed in the center since last visit: Has Dressing in Place as Prescribed: Yes Pain Present Now: No Electronic Signature(s) Signed: 01/24/2022 11:48:14 AM By: Erenest Blank Entered By: Erenest Blank on 01/24/2022 09:13:29 -------------------------------------------------------------------------------- Encounter Discharge Information Details Patient Name: Date of Service: Christian Brown E. 01/24/2022 9:15 A M Medical Record Number: [id number] Patient Account Number: [id number] Date of Birth/Sex: Treating RN: [birth year] (56 y.o. [name] Primary Care Saria Haran: [name] Other Clinician: Referring Nahome Bublitz: Treating Zaynab Chipman/Extender: [name] Weeks in Treatment: 34 Encounter Discharge Information Items Discharge Condition: Stable Ambulatory  Status: Ambulatory Discharge Destination: Home Transportation: Private Auto Accompanied By: self Schedule Follow-up Appointment: Yes Clinical Summary of Care: Patient Declined Electronic Signature(s) Signed: 01/30/2022 4:05:37 PM By: [name] RN Entered By: [name] on 01/24/2022 09:53:35 Weissman, Ofilia Neas ([id number]) [id number] Page 2 of 7 -------------------------------------------------------------------------------- Lower Extremity Assessment Details Patient Name: Date of Service: Christian, Murphy 01/24/2022 9:15 A M Medical Record Number: [id number] Patient Account Number: [id number] Date of Birth/Sex: Treating RN: 02-Jan-1966 (56 y.o. [name] Primary Care Kenyana Husak: [name] Other Clinician: Referring Imraan Wendell: Treating Bonita Brindisi/Extender: [name] Weeks in Treatment: 45 Electronic Signature(s) Signed: 01/24/2022 11:48:14 AM By: Erenest Blank Signed: 01/30/2022 4:05:37 PM By: [name] RN Entered By: Erenest Blank on 01/24/2022 09:14:15 -------------------------------------------------------------------------------- Multi Wound Chart Details Patient Name: Date of Service: Christian Brown E. 01/24/2022 9:15 A M Medical Record Number: [id number] Patient Account Number: [id number] Date of Birth/Sex: Treating RN: [birth year] (56 y.o. [name], [name] Primary Care Avyonna Wagoner: [name] Other Clinician: Referring Lavine Hargrove: Treating Reshaun Briseno/Extender: [name] Weeks in Treatment: 35 Vital Signs Height(in): Pulse(bpm): 106 Weight(lbs): Blood Pressure(mmHg): 113/77 Body Mass Index(BMI): Temperature(F): 97.9 Respiratory Rate(breaths/min): 18 [1:Photos:] [N/A:N/A] Midline Back N/A N/A Wound Location: Surgical Injury N/A N/A Wounding Event: Open Surgical Wound N/A N/A Primary Etiology: Hypertension, Type II Diabetes, N/A N/A Comorbid History: Osteomyelitis 01/11/2021 N/A  N/A Date Acquired: 30 N/A N/A Weeks of Treatment: Open N/A N/A Wound Status: No N/A N/A Wound Recurrence: 3x0.2x0.3 N/A N/A Measurements L x W x D (cm) 0.471 N/A N/A A (cm) : rea 0.141 N/A N/A Volume (cm) : 99.40% N/A N/A % Reduction in Area: 99.90% N/A N/A % Reduction in Volume: Full Thickness With Exposed Support N/A N/A Classification: Structures Medium N/A N/A Exudate Amount: Serosanguineous N/A N/A Exudate Type: red, brown N/A N/A Exudate Color: Distinct, outline attached N/A N/A Wound Margin: Large (67-100%) N/A N/A Granulation Amount: EURIAH, MATLACK ([id number]) [id number] Page 3 of 7 Red N/A N/A Granulation  Quality: None Present (0%) N/A N/A Necrotic Amount: Fat Layer (Subcutaneous Tissue): Yes N/A N/A Exposed Structures: Fascia: No Tendon: No Muscle: No Joint: No Bone: No Large (67-100%) N/A N/A Epithelialization: Debridement - Excisional N/A N/A Debridement: Pre-procedure Verification/Time Out 10:10 N/A N/A Taken: Lidocaine N/A N/A Pain Control: Subcutaneous, Slough N/A N/A Tissue Debrided: Skin/Subcutaneous Tissue N/A N/A Level: 0.6 N/A N/A Debridement A (sq cm): rea Curette N/A N/A Instrument: Minimum N/A N/A Bleeding: Pressure N/A N/A Hemostasis A chieved: 0 N/A N/A Procedural Pain: 0 N/A N/A Post Procedural Pain: Procedure was tolerated well N/A N/A Debridement Treatment Response: 3x0.2x0.3 N/A N/A Post Debridement Measurements L x W x D (cm) 0.141 N/A N/A Post Debridement Volume: (cm) Excoriation: No N/A N/A Periwound Skin Texture: Induration: No Callus: No Crepitus: No Rash: No Scarring: No Maceration: No N/A N/A Periwound Skin Moisture: Dry/Scaly: No Atrophie Blanche: No N/A N/A Periwound Skin Color: Cyanosis: No Ecchymosis: No Erythema: No Hemosiderin Staining: No Mottled: No Pallor: No Rubor: No No Abnormality N/A N/A Temperature: Cellular or Tissue Based Product N/A  N/A Procedures Performed: Debridement Treatment Notes Wound #1 (Back) Wound Laterality: Midline Cleanser Peri-Wound Care Skin Prep Discharge Instruction: Use skin prep as directed Topical Primary Dressing Secondary Dressing ADAPTIC TOUCH 3x4.25 in Discharge Instruction: Applied over primary dressing secured with steri-strips as directed. Woven Gauze Sponge, Non-Sterile 4x4 in Discharge Instruction: Apply over primary dressing as directed. Zetuvit Plus Silicone Border Dressing 7x7(in/in) Discharge Instruction: Apply silicone border over primary dressing as directed. Secured With Compression Wrap Compression Stockings Add-Ons Electronic Signature(s) Signed: 01/24/2022 11:07:44 AM By: Kalman Shan DO Signed: 01/30/2022 4:05:37 PM By: [name] RN Entered By: Kalman Shan on 01/24/2022 10:27:37 STEFANO, TRULSON ([id number]) [id number] Page 4 of 7 -------------------------------------------------------------------------------- Multi-Disciplinary Care Plan Details Patient Name: Date of Service: SEVERN, GODDARD 01/24/2022 9:15 A M Medical Record Number: [id number] Patient Account Number: [id number] Date of Birth/Sex: Treating RN: 01-23-1966 (56 y.o. [name], [name] Primary Care Patryck Kilgore: [name] Other Clinician: Referring Tracia Lacomb: Treating Esthela Brandner/Extender: [name] in Treatment: [place] reviewed with physician Active Inactive Wound/Skin Impairment Nursing Diagnoses: Impaired tissue integrity Goals: Patient/caregiver will verbalize understanding of skin care regimen Date Initiated: 03/28/2021 Target Resolution Date: 02/08/2022 Goal Status: Active Ulcer/skin breakdown will have a volume reduction of 30% by week 4 Date Initiated: 03/28/2021 Date Inactivated: 05/27/2021 Target Resolution Date: 05/31/2021 Goal Status: Met Interventions: Assess patient/caregiver ability to obtain  necessary supplies Assess patient/caregiver ability to perform ulcer/skin care regimen upon admission and as needed Assess ulceration(s) every visit Provide education on ulcer and skin care Treatment Activities: Topical wound management initiated : 03/28/2021 Notes: Electronic Signature(s) Signed: 01/30/2022 4:05:37 PM By: [name] RN Entered By: [name] on 01/24/2022 09:53:05 -------------------------------------------------------------------------------- Pain Assessment Details Patient Name: Date of Service: [name] 01/24/2022 9:15 A M Medical Record Number: [id number] Patient Account Number: [id number] Date of Birth/Sex: Treating RN: 17-Jun-1965 (56 y.o. [name] Primary Care Vincenza Dail: [name] Other Clinician: Referring Ayce Pietrzyk: Treating Enrigue Hashimi/Extender: [name] Weeks in Treatment: 43 Active Problems Location of Pain Severity and Description of Pain Patient Has Paino No Site Locations BREKEN, NAZARI E ([id number]) [id number] Page 5 of 7 Pain Management and Medication Current Pain Management: Electronic Signature(s) Signed: 01/24/2022 11:48:14 AM By: Erenest Blank Signed: 01/30/2022 4:05:37 PM By: [name] RN Entered By: Erenest Blank on 01/24/2022 09:14:08 -------------------------------------------------------------------------------- Patient/Caregiver Education Details Patient Name: Date of Service: [place], [place] Jerrell Mylar. 10/13/2023andnbsp9:15 [place]  Record Number: [id number] Patient Account Number: [id number] Date of Birth/Gender: Treating RN: 07-30-1965 (56 y.o. [name] Primary Care Physician: [name] Other Clinician: Referring Physician: Treating Physician/Extender: [name] in Treatment: 46 Education Assessment Education Provided To: Patient Education Topics Provided Wound/Skin Impairment: Methods: Explain/Verbal Responses:  Reinforcements needed, State content correctly Electronic Signature(s) Signed: 01/30/2022 4:05:37 PM By: [name] RN Entered By: [name] on 01/24/2022 09:52:46 -------------------------------------------------------------------------------- Wound Assessment Details Patient Name: Date of Service: [name] 01/24/2022 9:15 A M Medical Record Number: [id number] Patient Account Number: [id number] Date of Birth/Sex: Treating RN: 30-Nov-1965 (56 y.o. [name] Primary Care Sandeep Radell: [name] Other Clinician: TAHJAI, SCHETTER ([id number]) [id number] Page 6 of 7 Referring Noemie Devivo: Treating Ian Castagna/Extender: [name] Weeks in Treatment: 43 Wound Status Wound Number: 1 Primary Etiology: Open Surgical Wound Wound Location: Midline Back Wound Status: Open Wounding Event: Surgical Injury Comorbid History: Hypertension, Type II Diabetes, Osteomyelitis Date Acquired: 01/11/2021 Weeks Of Treatment: 43 Clustered Wound: No Photos Wound Measurements Length: (cm) 3 Width: (cm) 0.2 Depth: (cm) 0.3 Area: (cm) 0.471 Volume: (cm) 0.141 % Reduction in Area: 99.4% % Reduction in Volume: 99.9% Epithelialization: Large (67-100%) Tunneling: No Undermining: No Wound Description Classification: Full Thickness With Exposed Suppo Wound Margin: Distinct, outline attached Exudate Amount: Medium Exudate Type: Serosanguineous Exudate Color: red, brown rt Structures Foul Odor After Cleansing: No Slough/Fibrino Yes Wound Bed Granulation Amount: Large (67-100%) Exposed Structure Granulation Quality: Red Fascia Exposed: No Necrotic Amount: None Present (0%) Fat Layer (Subcutaneous Tissue) Exposed: Yes Tendon Exposed: No Muscle Exposed: No Joint Exposed: No Bone Exposed: No Periwound Skin Texture Texture Color No Abnormalities Noted: No No Abnormalities Noted: No Callus: No Atrophie Blanche: No Crepitus: No Cyanosis:  No Excoriation: No Ecchymosis: No Induration: No Erythema: No Rash: No Hemosiderin Staining: No Scarring: No Mottled: No Pallor: No Moisture Rubor: No No Abnormalities Noted: No Dry / Scaly: No Temperature / Pain Maceration: No Temperature: No Abnormality Treatment Notes Wound #1 (Back) Wound Laterality: Midline Cleanser Peri-Wound Care Skin Prep Discharge Instruction: Use skin prep as directed Topical JUNE, VACHA ([id number]) [id number] Page 7 of 7 Primary Dressing Secondary Dressing ADAPTIC TOUCH 3x4.25 in Discharge Instruction: Applied over primary dressing secured with steri-strips as directed. Woven Gauze Sponge, Non-Sterile 4x4 in Discharge Instruction: Apply over primary dressing as directed. Zetuvit Plus Silicone Border Dressing 7x7(in/in) Discharge Instruction: Apply silicone border over primary dressing as directed. Secured With Compression Wrap Compression Stockings Add-Ons Electronic Signature(s) Signed: 01/24/2022 11:48:14 AM By: Erenest Blank Signed: 01/30/2022 4:05:37 PM By: [name] RN Entered By: Erenest Blank on 01/24/2022 09:24:43 -------------------------------------------------------------------------------- Vitals Details Patient Name: Date of Service: Christian Brown E. 01/24/2022 9:15 A M Medical Record Number: [id number] Patient Account Number: [id number] Date of Birth/Sex: Treating RN: [birth year] (56 y.o. [name] Primary Care Omari Koslosky: [name] Other Clinician: Referring Candida Vetter: Treating Maryclaire Stoecker/Extender: [name] Weeks in Treatment: 53 Vital Signs Time Taken: 09:13 Temperature (F): 97.9 Pulse (bpm): 106 Respiratory Rate (breaths/min): 18 Blood Pressure (mmHg): 113/77 Reference Range: 80 - 120 mg / dl Electronic Signature(s) Signed: 01/24/2022 11:48:14 AM By: Erenest Blank Entered By: Erenest Blank on 01/24/2022 09:13:59

## 2022-01-31 ENCOUNTER — Encounter (HOSPITAL_BASED_OUTPATIENT_CLINIC_OR_DEPARTMENT_OTHER): Payer: No Typology Code available for payment source | Admitting: Internal Medicine

## 2022-01-31 DIAGNOSIS — L98424 Non-pressure chronic ulcer of back with necrosis of bone: Secondary | ICD-10-CM | POA: Diagnosis not present

## 2022-01-31 DIAGNOSIS — E11622 Type 2 diabetes mellitus with other skin ulcer: Secondary | ICD-10-CM | POA: Diagnosis not present

## 2022-02-03 ENCOUNTER — Other Ambulatory Visit (HOSPITAL_COMMUNITY): Payer: Self-pay

## 2022-02-04 ENCOUNTER — Other Ambulatory Visit (HOSPITAL_COMMUNITY): Payer: Self-pay

## 2022-02-05 ENCOUNTER — Other Ambulatory Visit (HOSPITAL_COMMUNITY): Payer: Self-pay

## 2022-02-05 ENCOUNTER — Other Ambulatory Visit: Payer: Self-pay | Admitting: Physical Medicine and Rehabilitation

## 2022-02-05 MED ORDER — MORPHINE SULFATE ER 15 MG PO TBCR
30.0000 mg | EXTENDED_RELEASE_TABLET | Freq: Two times a day (BID) | ORAL | 0 refills | Status: DC
  Filled 2022-02-05: qty 120, 40d supply, fill #0
  Filled 2022-02-21: qty 120, 30d supply, fill #0

## 2022-02-06 ENCOUNTER — Ambulatory Visit (INDEPENDENT_AMBULATORY_CARE_PROVIDER_SITE_OTHER): Payer: No Typology Code available for payment source | Admitting: Infectious Diseases

## 2022-02-06 ENCOUNTER — Encounter: Payer: Self-pay | Admitting: Infectious Diseases

## 2022-02-06 ENCOUNTER — Other Ambulatory Visit: Payer: Self-pay

## 2022-02-06 VITALS — BP 131/82 | HR 85 | Temp 97.6°F | Wt 298.0 lb

## 2022-02-06 DIAGNOSIS — Z5181 Encounter for therapeutic drug level monitoring: Secondary | ICD-10-CM

## 2022-02-06 DIAGNOSIS — T847XXD Infection and inflammatory reaction due to other internal orthopedic prosthetic devices, implants and grafts, subsequent encounter: Secondary | ICD-10-CM | POA: Diagnosis not present

## 2022-02-06 DIAGNOSIS — G8929 Other chronic pain: Secondary | ICD-10-CM | POA: Diagnosis not present

## 2022-02-06 NOTE — Progress Notes (Signed)
[name] ([id number]) [id number] Page 1 of 10 Visit Report for 01/31/2022 Chief Complaint Document Details Patient Name: Date of Service: [name], [name] 01/31/2022 9:15 A M Medical Record Number: [id number] Patient Account Number: [id number] Date of Birth/Sex: Treating RN: April 07, 1966 (56 y.o. [name] Primary Care Provider: Kathlene Murphy Other Clinician: Referring Provider: Treating Provider/Extender: [name] Weeks in Treatment: 44 Information Obtained from: Patient Chief Complaint 03/28/2021; Back wound Electronic Signature(s) Signed: 01/31/2022 11:55:19 AM By: Christian Shan DO Entered By: [name] on 01/31/2022 09:51:31 -------------------------------------------------------------------------------- Cellular or Tissue Based Product Details Patient Name: Date of Service: [name], [name] 01/31/2022 9:15 A M Medical Record Number: [id number] Patient Account Number: [id number] Date of Birth/Sex: Treating RN: 07-15-1965 (56 y.o. [name], [name] Primary Care Provider: Kathlene Murphy Other Clinician: Referring Provider: Treating Provider/Extender: [name] in Treatment: 32 Cellular or Tissue Based Product Type Wound #1 Midline Back Applied to: Performed By: Physician Christian Shan, DO Cellular or Tissue Based Product Type: Epicord Level of Consciousness (Pre-procedure): Awake and Alert Pre-procedure Verification/Time Out Yes - 09:40 Taken: Location: trunk / arms / legs Wound Size (sq cm): 1.2 Product Size (sq cm): 6 Waste Size (sq cm): 0 Amount of Product Applied (sq cm): 6 Instrument Used: Forceps, Scissors Lot #: [id number] Expiration Date: 09/13/2026 Fenestrated: No Reconstituted: Yes Solution Type: Normal Saline Solution Amount: 71m Lot #: [id number] Expiration Date: 08/11/2024 Secured: Yes Secured With: Steri-Strips Dressing Applied: Yes Primary Dressing:  Christian Procedural Pain: 0 Post Procedural Pain: 0 Response to Treatment: Procedure was tolerated well Level of Consciousness (Post- Awake and Alert procedure): [name]([id number] Page 2 of 10 Post Procedure Diagnosis Same as Pre-procedure Electronic Signature(s) Signed: 01/31/2022 11:55:19 AM By: HKalman ShanDO Signed: 02/06/2022 4:44:37 PM By: BRhae HammockRN Entered By: BRhae Hammockon 01/31/2022 09:44:53 -------------------------------------------------------------------------------- Debridement Details Patient Name: Date of Service: [name] 01/31/2022 9:15 A M Medical Record Number: [id number] Account Number: [id number] of Birth/Sex: Treating RN: 303/17/1967(56y.o. MBurnadette Murphy [name] Primary Care Provider: PKathlene NovemberOther Clinician: Referring Provider: Treating Provider/Extender: HFreddi StarrWeeks in Treatment: 44 Debridement Performed for Assessment: Wound #1 Midline Back Performed By: Physician HKalman Shan DO Debridement Type: Debridement Level of Consciousness (Pre-procedure): Awake and Alert Pre-procedure Verification/Time Out Yes - 09:40 Taken: Start Time: 09:40 Pain Control: Lidocaine T Area Debrided (L x W): otal 3 (cm) x 0.4 (cm) = 1.2 (cm) Tissue and other material debrided: Viable, Non-Viable, Slough, Subcutaneous, Slough Level: Skin/Subcutaneous Tissue Debridement Description: Excisional Instrument: Curette Bleeding: Minimum Hemostasis Achieved: Pressure End Time: 09:40 Procedural Pain: 0 Post Procedural Pain: 0 Response to Treatment: Procedure was tolerated well Level of Consciousness (Post- Awake and Alert procedure): Post Debridement Measurements of Total Wound Length: (cm) 3 Width: (cm) 0.4 Depth: (cm) 0.3 Volume: (cm) 0.283 Character of Wound/Ulcer Post Debridement: Improved Post Procedure Diagnosis Same as Pre-procedure Electronic  Signature(s) Signed: 01/31/2022 11:55:19 AM By: HKalman ShanDO Signed: 02/06/2022 4:44:37 PM By: BRhae HammockRN Entered By: BRhae Hammockon 01/31/2022 09:42:40 -------------------------------------------------------------------------------- HPI Details Patient Name: Date of Service: [name] 01/31/2022 9:15 A M Medical Record Number: [id number] Account Number: [id number], [name]([id number] Page 3 of 10 Date of Birth/Sex: Treating RN: 311/10/1965(56y.o. MErie NoePrimary Care Provider: Other Clinician: PKathlene NovemberReferring Provider: Treating Provider/Extender: HFreddi StarrWeeks in Treatment: 439History of Present Illness HPI Description:  Admission 03/28/2021 [name] is a 56 year old male with a past medical history of controlled type 2 diabetes on oral agents, obesity and OSA that presents to the clinic for a back wound. On 01/11/2021 patient had a laminectomy with PLIF of L1-S1 by Dr. Venetia Constable because of lumbar stenosis and radiculopathy. He subsequently developed bacteremia. He had CT imaging on 10/13 of the lumbar spine that showed fluid collection in the soft tissue of the posterior L1 and S1 and was taken to the OR for washout on 10/14. 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 He received 4 weeks of IV antibiotics by infectious disease. After his completion of 4 weeks of IV antibiotics he was continued for an additional 4 weeks of IV cefazolin with a stop date of 12/29. He has been evaluated by plastic surgery and no plans for surgical intervention at this time. Wife is present and reports he has been on the wound VAC for the past 8 weeks with improvement in wound healing. He currently denies systemic signs of infection. 12/22; patient presents for follow-up. He reports no issues since last clinic visit. He denies signs of infection. He has been  tolerating the wound VAC well. 12/30; patient presents for follow-up. He reports no issues and has no complaints today. He has been tolerating the wound VAC well. 1/9; patient presents for follow-up. He has no issues or complaints today. He states he feels well. He has had no problems with the wound VAC. 1/16; patient presents for follow-up. He continues to use the wound VAC with no issues. He denies signs of infection. 1/23; patient presents for follow-up. He has been switched from IV cefazolin to oral cefadroxil by infectious disease. He has no issues or complaints today. He denies signs of infection. He continues to tolerate the wound VAC well. 1/30; patient presents for follow-up. He continues to tolerate the wound VAC well. 2/6; patient presents for follow-up. He has no issues or complaints today. He continues to tolerate the wound VAC well. He denies signs of infection. 2/13; patient presents for follow-up. He continues to do well with the wound VAC. He denies any issues. 2/27; patient presents for follow-up. He continues to use the wound VAC without any issues. He denies signs of infection. 3/20; patient presents for follow-up. He has no issues or complaints today. He continues to use the wound VAC. 4/3; patient presents for follow-up. He continues to use the wound VAC without issues. He denies signs of infection. 4/17; 2-week follow-up. He continues to do well. His measurements are improved. Initially a surgical wound complicated by infection. 5/1; patient presents for follow-up. He has no issues or complaints today. He continues to tolerate the wound VAC well. He denies signs of infection. 5/15; patient presents for follow-up. He has noted more maceration to the periwound. He has been using the wound VAC without issues. He currently denies signs of infection. 5/30; patient presents for follow-up. He has been tolerating the wound VAC well over the past 2 weeks. He no longer has maceration to  the periwound. He has no issues or complaints today. 6/8; this patient with a postsurgical wound that was complicated by postop infection. He has been using silver collagen under wound VAC and gradually doing well improvement in dimensions especially the tunnel at 12:00 6/22; patient presents for follow-up. We have been using silver collagen under the wound VAC. Patient has no issues or questions today. 7/6; patient presents for follow-up. Patient continues  to use collagen under the wound VAC with no issues. 7/20; patient presents for follow-up. He has been using collagen under the wound VAC with no issues. He has been approved for Epicord. This was discussed with the patient and he is in agreement to having this placed at next clinic visit. 7/27; patient presents for follow-up. He has been tolerating the wound VAC well with collagen underneath. He has been approved for epi cord and we have this to place in office today. 8/4; patient presents for follow-up. He had Epicort placed at last clinic visit. We held off on the wound VAC. He tolerated this well. He has no issues or complaints today. 8/11; patient presents for follow-up. Epicord was placed at last clinic visit. He had the wound VAC on for the past week. He states that no drainage was suctioned into the canister. He has slight maceration to the periwound. 8/18; patient presents for follow-up. Epicord #3 was placed in standard fashion last week. The wound VAC was held. He has no issues or complaints today. 8/25; Patient presents for follow-up. Epicord #4 was placed in standard fashion last week. The wound VAC was held again. He has no issues or complaints today. There has been improvement in wound healing. 9/1; patient presents for follow-up. Epicort #5 was placed in standard fashion last week. He has no issues or complaints today. 9/8; patient presents for follow-up. Epicord #6 was placed in standard fashion last week. He tolerated this well  and has no issues or complaints today. 9/15; Epicort No. 7 the wound looks healthy. Perhaps some hyper granulated tissue in the lower 50% of the longitudinal wound 9/22; Epicord #8 placed at last clinic visit. Wound appears well-healing. Patient has no issues or complaints today. 9/28. Epicord No. 9. Continued improvement. 10/6; patient has completed the allotted epicord Applications that insurance would cover. Insurance denied extending more applications. Patient has no issues or complaints today. He tolerated the last application well. 10/13; patient presents for follow-up. We have been using endoform to the wound bed over the past week. He has been approved for 5 more applications of Epicord. [name], [name] ([id number]) [id number] Page 4 of 10 10/20; patient presents for follow-up. Epicort was placed at last clinic visit. He has no issues or complaints today. He denies signs of infection. Electronic Signature(s) Signed: 01/31/2022 11:55:19 AM By: Christian Shan DO Entered By: [name] on 01/31/2022 09:52:03 -------------------------------------------------------------------------------- Physical Exam Details Patient Name: Date of Service: [name], [name] 01/31/2022 9:15 A M Medical Record Number: [id number] Patient Account Number: [id number] Date of Birth/Sex: Treating RN: Oct 10, 1965 (56 y.o. [name] Primary Care Provider: Kathlene Murphy Other Clinician: Referring Provider: Treating Provider/Extender: [name] Weeks in Treatment: 9 Constitutional respirations regular, non-labored and within target range for patient.Marland Kitchen Psychiatric pleasant and cooperative. Notes Wound exam; lumbar spine surgical incision site. Tunnel at the 12 o'clock position About 5 cm. He has granulation tissue With minimal slough in all wound areas no signs of infection. Electronic Signature(s) Signed: 01/31/2022 11:55:19 AM By: Christian Shan  DO Entered By: [name] on 01/31/2022 09:53:47 -------------------------------------------------------------------------------- Physician Orders Details Patient Name: Date of Service: [name]. 01/31/2022 9:15 A M Medical Record Number: [id number] Patient Account Number: [id number] Date of Birth/Sex: Treating RN: Aug 02, 1965 (56 y.o. [name] Primary Care Provider: Kathlene Murphy Other Clinician: Referring Provider: Treating Provider/Extender: [name] Weeks in Treatment: 52 Verbal / Phone Orders: No Diagnosis Coding Follow-up Appointments ppointment in 1  week. - Friday Dr. Heber Indios and Allayne Butcher, Rm # 9 Return A ppointment in 2 weeks. - Friday Dr. Heber Brewster and Allayne Butcher, Rm # 9 Return A Anesthetic (In clinic) Topical Lidocaine 5% applied to wound bed Cellular or Tissue Based Products Cellular or Tissue Based Product Type: - Epicord #3 applied 11/22/21 Epicord #4 11/29/21 Epicord #5 12/06/2021 Epicord #6 12/13/21 Epicord # 7 12/20/21 Epicord # 8 12/27/21 Epicord # 9 01/03/22 Epicord # 10 01/09/22 Epicord # 11 01/24/22 Epicord # 12 01/31/22 Walz, Glendell Docker E ([id number]) [id number] Page 5 of 10 daptic or Murphy. (DO NOT REMOVE). - leave Cellular or Tissue Based Product applied to wound bed, secured with steri-strips, cover with A the Christian and steri-strips in place. Bathing/ Shower/ Hygiene May shower with protection but do not get wound dressing(s) wet. Negative Presssure Wound Therapy Discontinue wound vac - Patient to call [facility] to pick up wound vac. Additional Orders / Instructions Follow Nutritious Diet - Continue to monitor blood sugars daily Wound Treatment Wound #1 - Back Wound Laterality: Midline Peri-Wound Care: Skin Prep Every Other Day/30 Days Discharge Instructions: Use skin prep as directed Secondary Dressing: Christian TOUCH 3x4.25 in Every Other Day/30 Days Discharge Instructions: Applied over primary  dressing secured with steri-strips as directed. Secondary Dressing: Woven Gauze Sponge, Non-Sterile 4x4 in Every Other Day/30 Days Discharge Instructions: Apply over primary dressing as directed. Secondary Dressing: Zetuvit Plus Silicone Border Dressing 7x7(in/in) (Generic) Every Other Day/30 Days Discharge Instructions: Apply silicone border over primary dressing as directed. Electronic Signature(s) Signed: 01/31/2022 11:55:19 AM By: Christian Shan DO Entered By: [name] on 01/31/2022 09:53:54 -------------------------------------------------------------------------------- Problem List Details Patient Name: Date of Service: [name]. 01/31/2022 9:15 A M Medical Record Number: [id number] Patient Account Number: [id number] Date of Birth/Sex: Treating RN: 05/18/1965 (56 y.o. 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 03/28/2021 No Yes M86.9 Osteomyelitis, unspecified 03/28/2021 No Yes E11.622 Type 2 diabetes mellitus with other skin ulcer 03/28/2021 No Yes Inactive Problems Resolved Problems Electronic Signature(s) Signed: 01/31/2022 11:55:19 AM By: Christian Shan DO Entered By: [name] on 01/31/2022 09:51:19 Rolly Salter ([id number]) [id number] Page 6 of 10 -------------------------------------------------------------------------------- Progress Note Details Patient Name: Date of Service: [name], [name] 01/31/2022 9:15 A M Medical Record Number: [id number] Patient Account Number: [id number] Date of Birth/Sex: Treating RN: 1965-08-10 (56 y.o. [name] Primary Care Provider: Kathlene Murphy Other Clinician: Referring Provider: Treating Provider/Extender:  [name] Weeks in Treatment: 31 Subjective Chief Complaint Information obtained from Patient 03/28/2021; Back wound History of Present Illness (HPI) Admission 03/28/2021 Mr. Reginal Wojcicki is a 56 year old male with a past medical history of controlled type 2 diabetes on oral agents, obesity and OSA that presents to the clinic for a back wound. On 01/11/2021 patient had a laminectomy with PLIF of L1-S1 by Dr. Venetia Constable because of lumbar stenosis and radiculopathy. He subsequently developed bacteremia. He had CT imaging on 10/13 of the lumbar spine that showed fluid collection in the soft tissue of the posterior L1 and S1 and was taken to the OR for washout on 10/14. 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 He received 4 weeks of IV antibiotics by infectious disease. After his completion of 4 weeks of IV antibiotics he was continued for an additional  4 weeks of IV cefazolin with a stop date of 12/29. He has been evaluated by plastic surgery and no plans for surgical intervention at this time. Wife is present and reports he has been on the wound VAC for the past 8 weeks with improvement in wound healing. He currently denies systemic signs of infection. 12/22; patient presents for follow-up. He reports no issues since last clinic visit. He denies signs of infection. He has been tolerating the wound VAC well. 12/30; patient presents for follow-up. He reports no issues and has no complaints today. He has been tolerating the wound VAC well. 1/9; patient presents for follow-up. He has no issues or complaints today. He states he feels well. He has had no problems with the wound VAC. 1/16; patient presents for follow-up. He continues to use the wound VAC with no issues. He denies signs of infection. 1/23; patient presents for follow-up. He has been switched from IV cefazolin to oral cefadroxil by infectious disease. He has no issues or complaints today.  He denies signs of infection. He continues to tolerate the wound VAC well. 1/30; patient presents for follow-up. He continues to tolerate the wound VAC well. 2/6; patient presents for follow-up. He has no issues or complaints today. He continues to tolerate the wound VAC well. He denies signs of infection. 2/13; patient presents for follow-up. He continues to do well with the wound VAC. He denies any issues. 2/27; patient presents for follow-up. He continues to use the wound VAC without any issues. He denies signs of infection. 3/20; patient presents for follow-up. He has no issues or complaints today. He continues to use the wound VAC. 4/3; patient presents for follow-up. He continues to use the wound VAC without issues. He denies signs of infection. 4/17; 2-week follow-up. He continues to do well. His measurements are improved. Initially a surgical wound complicated by infection. 5/1; patient presents for follow-up. He has no issues or complaints today. He continues to tolerate the wound VAC well. He denies signs of infection. 5/15; patient presents for follow-up. He has noted more maceration to the periwound. He has been using the wound VAC without issues. He currently denies signs of infection. 5/30; patient presents for follow-up. He has been tolerating the wound VAC well over the past 2 weeks. He no longer has maceration to the periwound. He has no issues or complaints today. 6/8; this patient with a postsurgical wound that was complicated by postop infection. He has been using silver collagen under wound VAC and gradually doing well improvement in dimensions especially the tunnel at 12:00 6/22; patient presents for follow-up. We have been using silver collagen under the wound VAC. Patient has no issues or questions today. 7/6; patient presents for follow-up. Patient continues to use collagen under the wound VAC with no issues. 7/20; patient presents for follow-up. He has been using collagen  under the wound VAC with no issues. He has been approved for Epicord. This was discussed with the patient and he is in agreement to having this placed at next clinic visit. 7/27; patient presents for follow-up. He has been tolerating the wound VAC well with collagen underneath. He has been approved for epi cord and we have this to place in office today. 8/4; patient presents for follow-up. He had Epicort placed at last clinic visit. We held off on the wound VAC. He tolerated this well. He has no issues or complaints today. 8/11; patient presents for follow-up. Epicord was placed at last clinic visit. He  had the wound VAC on for the past week. He states that no drainage was suctioned into the canister. He has slight maceration to the periwound. [name], [name] ([id number]) [id number] Page 7 of 10 8/18; patient presents for follow-up. Epicord #3 was placed in standard fashion last week. The wound VAC was held. He has no issues or complaints today. 8/25; Patient presents for follow-up. Epicord #4 was placed in standard fashion last week. The wound VAC was held again. He has no issues or complaints today. There has been improvement in wound healing. 9/1; patient presents for follow-up. Epicort #5 was placed in standard fashion last week. He has no issues or complaints today. 9/8; patient presents for follow-up. Epicord #6 was placed in standard fashion last week. He tolerated this well and has no issues or complaints today. 9/15; Epicort No. 7 the wound looks healthy. Perhaps some hyper granulated tissue in the lower 50% of the longitudinal wound 9/22; Epicord #8 placed at last clinic visit. Wound appears well-healing. Patient has no issues or complaints today. 9/28. Epicord No. 9. Continued improvement. 10/6; patient has completed the allotted epicord Applications that insurance would cover. Insurance denied extending more applications. Patient has no issues or complaints  today. He tolerated the last application well. 10/13; patient presents for follow-up. We have been using endoform to the wound bed over the past week. He has been approved for 5 more applications of Epicord. 10/20; patient presents for follow-up. Epicort was placed at last clinic visit. He has no issues or complaints today. He denies signs of infection. Patient History Information obtained from Patient. Family History Diabetes - Mother, Stroke - Siblings, No family history of Cancer, Heart Disease, Hereditary Spherocytosis, Hypertension, Kidney Disease, Lung Disease, Seizures, Thyroid Problems, Tuberculosis. Social History Never smoker, Marital Status - Married, Alcohol Use - Rarely, Drug Use - No History, Caffeine Use - Rarely. Medical History Cardiovascular Patient has history of Hypertension Endocrine Patient has history of Type II Diabetes Musculoskeletal Patient has history of Osteomyelitis Medical A Surgical History Notes nd Musculoskeletal DDD Objective Constitutional respirations regular, non-labored and within target range for patient.. Vitals Time Taken: 9:22 AM, Temperature: 97.9 F, Pulse: 99 bpm, Respiratory Rate: 17 breaths/min, Blood Pressure: 151/81 mmHg. Psychiatric pleasant and cooperative. General Notes: Wound exam; lumbar spine surgical incision site. Tunnel at the 12 o'clock position About 5 cm. He has granulation tissue With minimal slough in all wound areas no signs of infection. Integumentary (Hair, Skin) Wound #1 status is Open. Original cause of wound was Surgical Injury. The date acquired was: 01/11/2021. The wound has been in treatment 44 weeks. The wound is located on the Midline Back. The wound measures 3cm length x 0.4cm width x 0.3cm depth; 0.942cm^2 area and 0.283cm^3 volume. There is Fat Layer (Subcutaneous Tissue) exposed. There is no undermining noted, however, there is tunneling at 12:00 with a maximum distance of 7cm. There is a medium amount  of serosanguineous drainage noted. The wound margin is distinct with the outline attached to the wound base. There is large (67-100%) red granulation within the wound bed. There is no necrotic tissue within the wound bed. The periwound skin appearance did not exhibit: Callus, Crepitus, Excoriation, Induration, Rash, Scarring, Dry/Scaly, Maceration, Atrophie Blanche, Cyanosis, Ecchymosis, Hemosiderin Staining, Mottled, Pallor, Rubor, Erythema. Periwound temperature was noted as No Abnormality. 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 It extends the length of the scar to the superior portion. I suspect this tunnel has always been there and it had closed at the bottom Weeks ago and now the bottom has reopened. No signs of infection. He denies any pain. The visualized open wound appears well- healing. I debrided nonviable tissue to this area. At this time I recommended continuing with epi cord. Follow-up in 1 week. Procedures Wound #1 Pre-procedure diagnosis of Wound #1 is an Open Surgical Wound located on the Midline Back . There was a Excisional Skin/Subcutaneous Tissue Debridement with a total area of 1.2 sq cm performed by Christian Shan, DO. With the following instrument(s): Curette to remove Viable and Non-Viable tissue/material. Material removed includes Subcutaneous Tissue and Slough and after achieving pain control using Lidocaine. No specimens were taken. A time out was conducted at 09:40, prior to the start of the procedure. A Minimum amount of bleeding was controlled with Pressure. The procedure was tolerated well with a pain level of 0 throughout and a pain level of 0 following the procedure. Post Debridement Measurements: 3cm length x  0.4cm width x 0.3cm depth; 0.283cm^3 volume. Character of Wound/Ulcer Post Debridement is improved. Post procedure Diagnosis Wound #1: Same as Pre-Procedure Pre-procedure diagnosis of Wound #1 is an Open Surgical Wound located on the Midline Back. A skin graft procedure using a bioengineered skin substitute/cellular or tissue based product was performed by Christian Shan, DO with the following instrument(s): Forceps and Scissors. Epicord was applied and secured with Steri-Strips. 6 sq cm of product was utilized and 0 sq cm was wasted. Post Application, Christian was applied. A Time Out was conducted at 09:40, prior to the start of the procedure. The procedure was tolerated well with a pain level of 0 throughout and a pain level of 0 following the procedure. Post procedure Diagnosis Wound #1: Same as Pre-Procedure . Plan Follow-up Appointments: Return Appointment in 1 week. - Friday Dr. Heber Mitchell and Allayne Butcher, Rm # 9 Return Appointment in 2 weeks. - Friday Dr. Heber  and Allayne Butcher, Rm # 9 Anesthetic: (In clinic) Topical Lidocaine 5% applied to wound bed Cellular or Tissue Based Products: Cellular or Tissue Based Product Type: - Epicord #3 applied 11/22/21 Epicord #4 11/29/21 Epicord #5 12/06/2021 Epicord #6 12/13/21 Epicord # 7 12/20/21 Epicord # 8 12/27/21 Epicord # 9 01/03/22 Epicord # 10 01/09/22 Epicord # 11 01/24/22 Epicord # 12 01/31/22 Cellular or Tissue Based Product applied to wound bed, secured with steri-strips, cover with [name]. (DO NOT REMOVE). - leave the Christian and steri-strips in place. Bathing/ Shower/ Hygiene: May shower with protection but do not get wound dressing(s) wet. Negative Presssure Wound Therapy: Discontinue wound vac - Patient to call [facility] to pick up wound vac. Additional Orders / Instructions: Follow Nutritious Diet - Continue to monitor blood sugars daily WOUND #1: - Back Wound Laterality: Midline Peri-Wound Care: Skin Prep Every Other Day/30  Days Discharge Instructions: Use skin prep as directed Secondary Dressing: Christian TOUCH 3x4.25 in Every Other Day/30 Days Discharge Instructions: Applied over primary dressing secured with steri-strips as directed. Secondary Dressing: Woven Gauze Sponge, Non-Sterile 4x4 in Every Other Day/30 Days Discharge Instructions: Apply over primary dressing as directed. Secondary Dressing: Zetuvit Plus Silicone Border Dressing 7x7(in/in) (Generic) Every Other Day/30 Days Discharge Instructions: Apply silicone border over primary dressing as directed. 1. In office sharp debridement 2. Epicord #12 placed in standard fashion 3. Follow-up in 1 week Electronic Signature(s) Signed: 01/31/2022 11:55:19 AM  By: Christian Shan DO Entered By: [name] on 01/31/2022 09:57:03 [name], [name] ([id number]) [id number] Page 9 of 10 -------------------------------------------------------------------------------- HxROS Details Patient Name: Date of Service: [name], [name] 01/31/2022 9:15 A M Medical Record Number: [id number] Patient Account Number: [id number] Date of Birth/Sex: Treating RN: Nov 10, 1965 (56 y.o. [name], [place] Primary Care Provider: Kathlene Murphy Other Clinician: Referring Provider: Treating Provider/Extender: [name] Weeks in Treatment: 75 Information Obtained From Patient Cardiovascular Medical History: Positive for: Hypertension Endocrine Medical History: Positive for: Type II Diabetes Time with diabetes: Since mid 90's Treated with: Oral agents Blood sugar tested every day: Yes Tested : 2x day Musculoskeletal Medical History: Positive for: Osteomyelitis Past Medical History Notes: DDD Immunizations Pneumococcal Vaccine: Received Pneumococcal Vaccination: Yes Received Pneumococcal Vaccination On or After 60th Birthday: No Implantable Devices Yes Family and Social History Cancer: No; Diabetes: Yes - Mother; Heart Disease:  No; Hereditary Spherocytosis: No; Hypertension: No; Kidney Disease: No; Lung Disease: No; Seizures: No; Stroke: Yes - Siblings; Thyroid Problems: No; Tuberculosis: No; Never smoker; Marital Status - Married; Alcohol Use: Rarely; Drug Use: No History; Caffeine Use: Rarely; Financial Concerns: No; Food, Clothing or Shelter Needs: No; Support System Lacking: No; Transportation Concerns: No Electronic Signature(s) Signed: 01/31/2022 11:55:19 AM By: Christian Shan DO Signed: 02/06/2022 4:44:37 PM By: [name] RN Entered By: [name] on 01/31/2022 09:52:09 -------------------------------------------------------------------------------- SuperBill Details Patient Name: Date of Service: [name] 01/31/2022 Medical Record Number: [id number] Patient Account Number: [id number] Date of Birth/Sex: Treating RN: [birth year] (56 y.o. 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 M86.9 Osteomyelitis, unspecified OLUWAFERANMI, WAIN ([id number]) [id number] Page 10 of 10 E11.622 Type 2 diabetes mellitus with other skin ulcer Facility Procedures : CPT4 Code: 27035009 Description: F8182 Epicord 2cm x 3cm - per sqcm Modifier: Quantity: 6 : CPT4 Code: 99371696 1 Description: 7893 - SKIN SUB GRAFT TRNK/ARM/LEG ICD-10 Diagnosis Description L98.424 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

## 2022-02-06 NOTE — Progress Notes (Signed)
       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   Klebsiella pneumoniae infection 01/28/2021   Wound infection after surgery 01/25/2021   Muscle spasms of both lower extremities 01/25/2021   Abdominal pain 01/25/2021   Spleen enlarged 01/25/2021   Lumbar radiculopathy 01/11/2021   Spasticity 12/03/2020   Neuropathy 09/28/2020   Chronic pain syndrome 09/28/2020   Intervertebral lumbar disc disorder with myelopathy, lumbar region 09/28/2020   Spondylosis, cervical, with myelopathy 09/28/2020   Unilateral primary osteoarthritis, right knee 10/28/2019   PCP NOTES >>>>>>>> 07/09/2015   DJD (degenerative joint disease) 10/27/2014   Screening for heart disease 06/28/2012   OSA (obstructive sleep apnea) 06/14/2012   ED (erectile dysfunction) 04/26/2012   Annual physical exam 12/29/2011   Achilles tendinitis 09/10/2010   Allergic rhinitis 07/12/2010   Hyperlipidemia 04/26/2007   DM II (diabetes mellitus, type II), controlled (HCC) 04/22/2006   OBESITY NOS 04/22/2006   Essential hypertension 04/22/2006   Current Outpatient Medications on File Prior to Visit  Medication Sig Dispense Refill   ascorbic acid (VITAMIN C) 500 MG tablet Take 1 tablet (500 mg total) by mouth 2 (two) times daily. 100 tablet 3   Blood Glucose Monitoring Suppl (FREESTYLE LITE) w/Device KIT Use to check blood sugar up to 4 times daily as directed 1 kit 0   Blood Glucose Monitoring Suppl (ONETOUCH VERIO FLEX  SYSTEM) w/Device KIT Test blood sugar twice a day.  Dx code: E11.9 1 kit 0   cefadroxil (DURICEF) 500 MG capsule Take 1 capsule (500 mg total) by mouth 2 (two) times daily. 60 capsule 11   cyclobenzaprine (FLEXERIL) 10 MG tablet Take 1 tablet (10 mg total) by mouth 3 (three) times daily as needed for muscle spasms. 90 tablet 0   diclofenac (VOLTAREN) 75 MG EC tablet Take 1 tablet (75 mg total) by mouth daily as needed for arthritis pain- kidney function OK 20 tablet 5   fluticasone (FLONASE) 50 MCG/ACT nasal spray Place into the nose.     gabapentin (NEURONTIN) 600 MG tablet Take 1 tablet (600 mg total) by mouth 3 (three) times daily. 270 tablet 1   glucose blood (FREESTYLE LITE) test strip Use 1 strip to check blood sugar up to 4 times daily as directed 200 each 0   glucose blood (ONETOUCH VERIO) test strip Check blood sugars before meals and at bedtime.  twice daily 200 strip 12   HYDROmorphone (DILAUDID) 2 MG tablet Take 1 tablet (2 mg total) by mouth every Monday, Wednesday, and Friday. Take 30 minutes prior to Wound VAC change 28 tablet 0   Lancets (FREESTYLE) lancets Use 1 lancet to check blood sugar up to 4 times daily as directed 200 each 0   losartan-hydrochlorothiazide (HYZAAR) 100-12.5 MG tablet Take 1 tablet by mouth daily. 90 tablet 0   morphine (MS CONTIN) 15 MG 12 hr tablet Take 2 tablets (30 mg total) by mouth every 12 (twelve) hours. Try to wean   to 15 mg in the morning and 30 mg in evening 2 weeks; then see if possible to go to 15 mg twice day- -for chronic pain 120 tablet 0   naloxone (NARCAN) nasal spray 4 mg/0.1 mL Use as directed 2 each 1   ondansetron (ZOFRAN) 4 MG tablet TAKE 1 TABLET BY MOUTH EVERY 8 HOURS AS NEEDED FOR NAUSEA/VOMITING. CAN CAUSE CONSTIPATION- MONITOR 90 tablet 1   polyethylene glycol (MIRALAX / GLYCOLAX) 17 g packet Take 17 g by mouth 2 (two) times daily. 14 each 0   promethazine (PHENERGAN) 12.5 MG tablet Take 1 tablet (12.5 mg total) by mouth every 6 (six)  hours as needed for nausea or vomiting. 90 tablet 5   rosuvastatin (CRESTOR) 20 MG tablet Take 1 tablet (20 mg total) by mouth daily. 90 tablet 0   senna (SENOKOT) 8.6 MG TABS tablet Take 3 tablets (25.8 mg total) by mouth 2 (two) times daily. 270 tablet 0   simethicone (MYLICON) 80 MG chewable tablet Chew 1 tablet (80 mg total) by mouth 4 (four) times daily as needed for flatulence. 60 tablet 0   tadalafil (CIALIS) 20 MG tablet Take 0.5-1 tablets (10-20 mg total) by mouth every other day as needed for erectile dysfunction. 30 tablet 3   tamsulosin (FLOMAX) 0.4 MG CAPS capsule Take 2 capsules (0.8 mg total) by mouth daily. 60 capsule 5   tiZANidine (ZANAFLEX) 4 MG tablet Take 1 tablet (4 mg total) by mouth 2 (two) times daily. 60 tablet 5   Zinc Sulfate 220 (50 Zn) MG TABS Take 1 tablet (220 mg total) by mouth daily. 100 tablet 5   No current facility-administered medications on file prior to visit.   Subjective: Here for follow up in the setting of deep lumbar wound infection associated with hardware. He is taking cefadroxil 500 mg PO BID without any issues. Denies fevers, chills and sweats. Denies nausea, vomiting and diarrhea. He is closely following with wound care with Dr Hiffamon and I have reviewed their last office notes from 01/24/2021.  Exam " Small tunnel at the 12 o'clock position about the size of a #3 curette. He has granulation tissue in all wound areas no signs of infection."   He follows with Dr Lovorn for pain management and takes morphine MS contin, tizanidine and diclofenac.  He is following with Dr Hoffman and decision will be made whether he needs to see Dr Ostergard or not later this week per patient's report.   Review of Systems: All other systems reviewed  with pertinent positives and negatives as listed above  Past Medical History:  Diagnosis Date   DDD (degenerative disc disease), lumbar    Diabetes mellitus    Hyperlipemia    Hypertension    Obesity    OSA  (obstructive sleep apnea)    on CPAP   Past Surgical History:  Procedure Laterality Date   APPLICATION OF ROBOTIC ASSISTANCE FOR SPINAL PROCEDURE N/A 01/11/2021   Procedure: APPLICATION OF ROBOTIC ASSISTANCE FOR SPINAL PROCEDURE;  Surgeon: Ostergard, Thomas A, MD;  Location: [facility] OR;  Service: Neurosurgery;  Laterality: N/A;   APPLICATION OF WOUND VAC N/A 01/25/2021   Procedure: APPLICATION OF WOUND VAC;  Surgeon: Dawley, Troy C, DO;  Location: [facility] OR;  Service: Neurosurgery;  Laterality: N/A;   BACK SURGERY     COLONOSCOPY  2019   LUMBAR WOUND DEBRIDEMENT N/A 01/25/2021   Procedure: Irrigation and Debridement of lumbar wound, and wound vacuum assisted closure;  Surgeon: Dawley, Troy C,   DO;  Location: [facility];  Service: Neurosurgery;  Laterality: N/A;   TRANSFORAMINAL LUMBAR INTERBODY FUSION (TLIF) WITH PEDICLE SCREW FIXATION 4 LEVEL N/A 01/11/2021   Procedure: Lumbar one-two, Lumbar two-three, Lumbar three-four, Lumbar four-five, Lumbar five Sacral one Open decompression, Transforaminal lumbar interbody fusion, posterolateral instrumented fusion;  Surgeon: Judith Part, MD;  Location: [facility];  Service: Neurosurgery;  Laterality: N/A;    Social History   Tobacco Use   Smoking status: Never   Smokeless tobacco: Never  Vaping Use   Vaping Use: Never used  Substance Use Topics   Alcohol use: No   Drug use: No    Family History  Problem Relation Age of Onset   Diabetes Mother    Hypertension Neg Hx    Coronary artery disease Neg Hx    Colon cancer Neg Hx    Prostate cancer Neg Hx     Allergies  Allergen Reactions   Cymbalta [Duloxetine Hcl] Other (See Comments)   Penicillins Other (See Comments)    REACTION: Blister and Sores in the mouth    Health Maintenance  Topic Date Due   OPHTHALMOLOGY EXAM  06/12/2017   COVID-19 Vaccine (5 - Pfizer series) 11/09/2020   FOOT EXAM  02/09/2021   TETANUS/TDAP  12/28/2021   Zoster Vaccines- Shingrix (2 of 2) 02/26/2022   HEMOGLOBIN  A1C  07/02/2022   Diabetic kidney evaluation - GFR measurement  10/02/2022   Diabetic kidney evaluation - Urine ACR  01/02/2023   COLONOSCOPY (Pts 45-60yr Insurance coverage will need to be confirmed)  01/11/2025   INFLUENZA VACCINE  Completed   Hepatitis C Screening  Completed   HIV Screening  Completed   HPV VACCINES  Aged Out    Objective: BP 131/82   Pulse 85   Temp 97.6 F (36.4 C) (Oral)   Wt 298 lb (135.2 kg)   SpO2 98%   BMI 38.26 kg/m     Physical Exam Constitutional:      Appearance: Normal appearance. Obese  HENT:     Head: Normocephalic and atraumatic.      Mouth: Mucous membranes are moist.  Eyes:    Conjunctiva/sclera: Conjunctivae normal.     Pupils: Pupils are equal, round,  Cardiovascular:     Rate and Rhythm: Normal rate and regular rhythm.     Heart sounds:   Pulmonary:     Effort: Pulmonary effort is normal.     Breath sounds:   Abdominal:     General:     Palpations: Abdomen is soft.   Musculoskeletal:        General: Normal range of motion.   Skin:    General:     Comments: midline lumbar wound has closed in the superior and inferior aspect except still has opening in the middle. No active drainage. No surrounding cellulitis, or fluctuance or crepitus but has dark discoloration of the surrounding skin.     Neurological:     General: grossly non focal and ambulatory     Mental Status: awake,  alert and oriented to person, place, and time.   Psychiatric:        Mood and Affect: Mood normal.   Lab Results Lab Results  Component Value Date   WBC 6.7 10/01/2021   HGB 13.0 (L) 10/01/2021   HCT 38.9 10/01/2021   MCV 84.7 10/01/2021   PLT 249 10/01/2021    Lab Results  Component Value Date   CREATININE 1.14 10/01/2021   BUN 16 10/01/2021  NA 141 10/01/2021   K 4.0 10/01/2021   CL 104 10/01/2021   CO2 29 10/01/2021    Lab Results  Component Value Date   ALT 19 01/01/2022   AST 16 01/01/2022   ALKPHOS 78 01/01/2022    BILITOT 0.6 01/01/2022    Lab Results  Component Value Date   CHOL 103 01/01/2022   HDL 33.30 (L) 01/01/2022   LDLCALC 41 01/01/2022   LDLDIRECT 118.0 09/14/2020   TRIG 147.0 01/01/2022   CHOLHDL 3 01/01/2022   No results found for: "LABRPR", "RPRTITER" No results found for: "HIV1RNAQUANT", "HIV1RNAVL", "CD4TABS"    Assessment/Plan # Klebsiella Lumbar Discitis and osteomyelitis complicated with hardware # Medication Monitoring   On cefadroxil suppression since 04/20/2021 due to + of hardware and will continue 500 mg bid dosing  Labs today  Follow-up with wound care for back wound, seems a decision will be made whether neurosurgery fu is needed after next fu with wound care Follow-up with us in 4 month/any concerns   # Chronic back pain  - s/p trigger point injections in the left lumbar paraspinal area  - On MS contin, tizanidine, and diclofenac,  - Follow with Dr Lovorn  I have personally spent 41 minutes involved in face-to-face and non-face-to-face activities for this patient on the day of the visit including coordination of care with other specialist and counseling the patient/family.  Sabina Manadhar, MD [facility] 02/06/2022, 8:22 AM  

## 2022-02-06 NOTE — Progress Notes (Signed)
[name] ([id number]) [id number] Page 1 of 6 Visit Report for 01/31/2022 Arrival Information Details Patient Name: Date of Service: [name], [name] 01/31/2022 9:15 A M Medical Record Number: [id number] Patient Account Number: [id number] Date of Birth/Sex: Treating RN: [birth year] (56 y.o. [name], [name] Primary Care Avir Deruiter: [name] Other Clinician: Referring Forest Pruden: Treating Sheridan Gettel/Extender: [name] in Treatment: 45 Visit Information History Since Last Visit Added or deleted any medications: No Patient Arrived: Ambulatory Any new allergies or adverse reactions: No Arrival Time: 09:21 Had a fall or experienced change in No Accompanied By: self activities of daily living that may affect Transfer Assistance: None risk of falls: Patient Identification Verified: Yes Signs or symptoms of abuse/neglect since last visito No Secondary Verification Process Completed: Yes Hospitalized since last visit: No Patient Requires Transmission-Based Precautions: No Implantable device outside of the clinic excluding No Patient Has Alerts: Yes cellular tissue based products placed in the center Patient Alerts: Patient on Blood Thinner since last visit: No BP Right Arm-PICC Has Dressing in Place as Prescribed: Yes Pain Present Now: No Electronic Signature(s) Signed: 02/06/2022 4:44:37 PM By: [name] RN Entered By: [name] on 01/31/2022 09:22:09 -------------------------------------------------------------------------------- Lower Extremity Assessment Details Patient Name: Date of Service: [name]. 01/31/2022 9:15 A M Medical Record Number: [id number] Patient Account Number: [id number] Date of Birth/Sex: Treating RN: 1965/09/21 (56 y.o. [name] Primary Care Anterrio Mccleery: [name] Other Clinician: Referring Tinsley Lomas: Treating Medea Deines/Extender: [name] Weeks in Treatment:  79 Electronic Signature(s) Signed: 02/06/2022 4:44:37 PM By: [name] RN Entered By: [name] on 01/31/2022 09:22:27 -------------------------------------------------------------------------------- Multi Wound Chart Details Patient Name: Date of Service: [name]. 01/31/2022 9:15 A M Medical Record Number: [id number] Patient Account Number: [id number] Date of Birth/Sex: Treating RN: 1965/06/15 (56 y.o. [name] Primary Care [name]: [name] Other Clinician: Referring Wessley Emert: Treating Vyctoria Dickman/Extender: [name] Weeks in Treatment: [id number], [place] ([id number]) [id number] Page 2 of 6 Vital Signs Height(in): Pulse(bpm): 99 Weight(lbs): Blood Pressure(mmHg): 151/81 Body Mass Index(BMI): Temperature(F): 97.9 Respiratory Rate(breaths/min): 17 [1:Photos:] [N/A:N/A] Midline Back N/A N/A Wound Location: Surgical Injury N/A N/A Wounding Event: Open Surgical Wound N/A N/A Primary Etiology: Hypertension, Type II Diabetes, N/A N/A Comorbid History: Osteomyelitis 01/11/2021 N/A N/A Date Acquired: 3 N/A N/A Weeks of Treatment: Open N/A N/A Wound Status: No N/A N/A Wound Recurrence: 3x0.4x0.3 N/A N/A Measurements L x W x D (cm) 0.942 N/A N/A A (cm) : rea 0.283 N/A N/A Volume (cm) : 98.80% N/A N/A % Reduction in A rea: 99.80% N/A N/A % Reduction in Volume: 12 Position 1 (o'clock): 7 Maximum Distance 1 (cm): Yes N/A N/A Tunneling: Full Thickness With Exposed Support N/A N/A Classification: Structures Medium N/A N/A Exudate A mount: Serosanguineous N/A N/A Exudate Type: red, brown N/A N/A Exudate Color: Distinct, outline attached N/A N/A Wound Margin: Large (67-100%) N/A N/A Granulation A mount: Red N/A N/A Granulation Quality: None Present (0%) N/A N/A Necrotic A mount: Fat Layer (Subcutaneous Tissue): Yes N/A N/A Exposed Structures: Fascia: No Tendon: No Muscle:  No Joint: No Bone: No Large (67-100%) N/A N/A Epithelialization: Debridement - Excisional N/A N/A Debridement: Pre-procedure Verification/Time Out 09:40 N/A N/A Taken: Lidocaine N/A N/A Pain Control: Subcutaneous, Slough N/A N/A Tissue Debrided: Skin/Subcutaneous Tissue N/A N/A Level: 1.2 N/A N/A Debridement A (sq cm): rea Curette N/A N/A Instrument: Minimum N/A N/A Bleeding: Pressure N/A N/A Hemostasis A chieved: 0 N/A N/A Procedural Pain: 0 N/A N/A  Post Procedural Pain: Procedure was tolerated well N/A N/A Debridement Treatment Response: 3x0.4x0.3 N/A N/A Post Debridement Measurements L x W x D (cm) 0.283 N/A N/A Post Debridement Volume: (cm) Excoriation: No N/A N/A Periwound Skin Texture: Induration: No Callus: No Crepitus: No Rash: No Scarring: No Maceration: No N/A N/A Periwound Skin Moisture: Dry/Scaly: No Atrophie Blanche: No N/A N/A Periwound Skin Color: Cyanosis: No Ecchymosis: No Erythema: No Hemosiderin Staining: No Mottled: No Pallor: No CORDARRIUS, [name] ([id number]) [id number] Page 3 of 6 Rubor: No No Abnormality N/A N/A Temperature: Cellular or Tissue Based Product N/A N/A Procedures Performed: Debridement Treatment Notes Electronic Signature(s) Signed: 01/31/2022 11:55:19 AM By: Kalman Shan DO Signed: 02/06/2022 4:44:37 PM By: [name] RN Entered By: Kalman Shan on 01/31/2022 09:51:24 -------------------------------------------------------------------------------- Multi-Disciplinary Care Plan Details Patient Name: Date of Service: [name]. 01/31/2022 9:15 A M Medical Record Number: [id number] Patient Account Number: [id number] Date of Birth/Sex: Treating RN: Sep 21, 1965 (56 y.o. [name], [name] Primary Care Deren Degrazia: [name] Other Clinician: Referring Cohen Doleman: Treating Quinnlan Abruzzo/Extender: [name] Weeks in Treatment: [place]  reviewed with physician Active Inactive Wound/Skin Impairment Nursing Diagnoses: Impaired tissue integrity Goals: Patient/caregiver will verbalize understanding of skin care regimen Date Initiated: 03/28/2021 Target Resolution Date: 02/08/2022 Goal Status: Active Ulcer/skin breakdown will have a volume reduction of 30% by week 4 Date Initiated: 03/28/2021 Date Inactivated: 05/27/2021 Target Resolution Date: 05/31/2021 Goal Status: Met Interventions: Assess patient/caregiver ability to obtain necessary supplies Assess patient/caregiver ability to perform ulcer/skin care regimen upon admission and as needed Assess ulceration(s) every visit Provide education on ulcer and skin care Treatment Activities: Topical wound management initiated : 03/28/2021 Notes: Electronic Signature(s) Signed: 02/06/2022 4:44:37 PM By: [name] RN Entered By: [name] on 01/31/2022 09:46:14 -------------------------------------------------------------------------------- Pain Assessment Details Patient Name: Date of Service: [name]. 01/31/2022 9:15 A M Medical Record Number: [id number] Patient Account Number: [id number] DEVARIS, QUIRK ([id number]) [id number] Page 4 of 6 Date of Birth/Sex: Treating RN: 12-Aug-1965 (56 y.o. [name] Primary Care Aidon Klemens: Other Clinician: [name] Referring Nico Syme: Treating Male Iafrate/Extender: [name] Weeks in Treatment: 3 Active Problems Location of Pain Severity and Description of Pain Patient Has Paino No Site Locations Pain Management and Medication Current Pain Management: Electronic Signature(s) Signed: 02/06/2022 4:44:37 PM By: [name] RN Entered By: [name] on 01/31/2022 09:22:23 -------------------------------------------------------------------------------- Patient/Caregiver Education Details Patient Name: Date of Service: [name]  10/20/2023andnbsp9:15 [place] Record Number: [id number] Patient Account Number: [id number] Date of Birth/Gender: Treating RN: 12-09-1965 (55 y.o. [name] Primary Care Physician: [name] Other Clinician: Referring Physician: Treating Physician/Extender: [name] in Treatment: 3 Education Assessment Education Provided To: Patient Education Topics Provided Wound/Skin Impairment: Methods: Explain/Verbal Responses: State content correctly [employer]) Signed: 02/06/2022 4:44:37 PM By: [name] RN Entered By: [name] on 01/31/2022 09:46:28 Oak, Ofilia Neas ([id number]) [id number] Page 5 of 6 -------------------------------------------------------------------------------- Wound Assessment Details Patient Name: Date of Service: PATRIK, TURNBAUGH 01/31/2022 9:15 A M Medical Record Number: [id number] Patient Account Number: [id number] Date of Birth/Sex: Treating RN: 1965/08/15 (56 y.o. [name] Primary Care Aarin Bluett: [name] Other Clinician: Referring Ifeoma Vallin: Treating Genevieve Arbaugh/Extender: [name] Weeks in Treatment: 44 Wound Status Wound Number: 1 Primary Etiology: Open Surgical Wound Wound Location: Midline Back Wound Status: Open Wounding Event: Surgical Injury Comorbid History: Hypertension, Type II Diabetes, Osteomyelitis Date Acquired: 01/11/2021 Weeks Of Treatment: 44 Clustered Wound: No  Photos Wound Measurements Length: (cm) 3 Width: (cm) 0.4 Depth: (cm) 0.3 Area: (cm) 0.942 Volume: (cm) 0.283 % Reduction in Area: 98.8% % Reduction in Volume: 99.8% Epithelialization: Large (67-100%) Tunneling: Yes Position (o'clock): 12 Maximum Distance: (cm) 7 Undermining: No Wound Description Classification: Full Thickness With Exposed Suppo Wound Margin: Distinct, outline attached Exudate Amount: Medium Exudate Type: Serosanguineous Exudate Color:  red, brown rt Structures Foul Odor After Cleansing: No Slough/Fibrino Yes Wound Bed Granulation Amount: Large (67-100%) Exposed Structure Granulation Quality: Red Fascia Exposed: No Necrotic Amount: None Present (0%) Fat Layer (Subcutaneous Tissue) Exposed: Yes Tendon Exposed: No Muscle Exposed: No Joint Exposed: No Bone Exposed: No Periwound Skin Texture Texture Color No Abnormalities Noted: No No Abnormalities Noted: No Callus: No Atrophie Blanche: No Crepitus: No Cyanosis: No Excoriation: No Ecchymosis: No Induration: No Erythema: No Rash: No Hemosiderin Staining: No Scarring: No Mottled: No Pallor: No Moisture Rubor: No No Abnormalities Noted: No Dry / Scaly: No Temperature / Pain Maceration: No Temperature: No Abnormality CHUONG, CASEBEER ([id number]) [id number] Page 6 of 6 Electronic Signature(s) Signed: 02/06/2022 4:44:37 PM By: [name] RN Entered By: [name] on 01/31/2022 09:29:54 -------------------------------------------------------------------------------- Vitals Details Patient Name: Date of Service: [name] 01/31/2022 9:15 A M Medical Record Number: [id number] Patient Account Number: [id number] Date of Birth/Sex: Treating RN: [birth year] (56 y.o. [name] Primary Care Yared Susan: [name] Other Clinician: Referring Jaimie Pippins: Treating Londin Antone/Extender: [name] Weeks in Treatment: 44 Vital Signs Time Taken: 09:22 Temperature (F): 97.9 Pulse (bpm): 99 Respiratory Rate (breaths/min): 17 Blood Pressure (mmHg): 151/81 Reference Range: 80 - 120 mg / dl Electronic Signature(s) Signed: 02/06/2022 4:44:37 PM By: [name] RN Entered By: [name] on 01/31/2022 09:22:19

## 2022-02-07 ENCOUNTER — Encounter (HOSPITAL_BASED_OUTPATIENT_CLINIC_OR_DEPARTMENT_OTHER): Payer: No Typology Code available for payment source | Admitting: Internal Medicine

## 2022-02-07 ENCOUNTER — Telehealth: Payer: Self-pay

## 2022-02-07 DIAGNOSIS — E11622 Type 2 diabetes mellitus with other skin ulcer: Secondary | ICD-10-CM | POA: Diagnosis not present

## 2022-02-07 DIAGNOSIS — L98424 Non-pressure chronic ulcer of back with necrosis of bone: Secondary | ICD-10-CM | POA: Diagnosis not present

## 2022-02-07 LAB — BASIC METABOLIC PANEL
BUN: 20 mg/dL (ref 7–25)
CO2: 27 mmol/L (ref 20–32)
Calcium: 8.9 mg/dL (ref 8.6–10.3)
Chloride: 105 mmol/L (ref 98–110)
Creat: 1.15 mg/dL (ref 0.70–1.30)
Glucose, Bld: 211 mg/dL — ABNORMAL HIGH (ref 65–99)
Potassium: 4.2 mmol/L (ref 3.5–5.3)
Sodium: 139 mmol/L (ref 135–146)

## 2022-02-07 LAB — CBC
HCT: 39.4 % (ref 38.5–50.0)
Hemoglobin: 13.3 g/dL (ref 13.2–17.1)
MCH: 29 pg (ref 27.0–33.0)
MCHC: 33.8 g/dL (ref 32.0–36.0)
MCV: 86 fL (ref 80.0–100.0)
MPV: 11.6 fL (ref 7.5–12.5)
Platelets: 184 10*3/uL (ref 140–400)
RBC: 4.58 10*6/uL (ref 4.20–5.80)
RDW: 13.8 % (ref 11.0–15.0)
WBC: 4.6 10*3/uL (ref 3.8–10.8)

## 2022-02-07 LAB — SEDIMENTATION RATE: Sed Rate: 6 mm/h (ref 0–20)

## 2022-02-07 LAB — C-REACTIVE PROTEIN: CRP: 3.4 mg/L (ref <8.0)

## 2022-02-07 NOTE — Telephone Encounter (Signed)
Left voicemail asking patient to return my call. Will also send my chart message.    [place], CMA

## 2022-02-07 NOTE — Telephone Encounter (Signed)
-----   Message from Rosiland Oz, MD sent at 02/07/2022  8:55 AM EDT ----- Labs noted with normal inflammatory markers and elevated blood glucose Advise to fu with PCP for blood glucose control given DM

## 2022-02-07 NOTE — Progress Notes (Signed)
LEMONT, SITZMANN ([id number]) [id number] Page 1 of 9 Visit Report for 02/07/2022 Chief Complaint Document Details Patient Name: Date of Service: [name], [name] 02/07/2022 9:15 A M Medical Record Number: [id number] Patient Account Number: [id number] Date of Birth/Sex: Treating RN: [birth year] (56 y.o. M) Primary Care Provider: [name] Other Clinician: Referring Provider: Treating Provider/Extender: [name] Weeks in Treatment: 6 Information Obtained from: Patient Chief Complaint 03/28/2021; Back wound Electronic Signature(s) Signed: 02/07/2022 12:14:36 PM By: Kalman Shan DO Entered By: Kalman Shan on 02/07/2022 11:14:13 -------------------------------------------------------------------------------- Cellular or Tissue Based Product Details Patient Name: Date of Service: EINER, MEALS 02/07/2022 9:15 A M Medical Record Number: [id number] Patient Account Number: [id number] Date of Birth/Sex: Treating RN: 1966-03-25 (56 y.o. [name] Primary Care Provider: [name] Other Clinician: Referring Provider: Treating Provider/Extender: [name] in Treatment: 45 Cellular or Tissue Based Product Type Wound #1 Midline Back Applied to: Performed By: Physician Kalman Shan, DO Cellular or Tissue Based Product Type: Epicord Level of Consciousness (Pre-procedure): Awake and Alert Pre-procedure Verification/Time Out Yes - 10:10 Taken: Location: trunk / arms / legs Wound Size (sq cm): 1.48 Product Size (sq cm): 6 Waste Size (sq cm): 0 Amount of Product Applied (sq cm): 6 Instrument Used: Forceps, Scissors Lot #: [id number] Order #: 13 Expiration Date: 09/13/2026 Fenestrated: No Reconstituted: Yes Solution Type: Normal Saline Solution Amount: 47m Lot #: [id number] Expiration Date: 08/11/1924 Secured: Yes Secured With: Steri-Strips, adaptic Dressing Applied: No Procedural Pain:  0 Post Procedural Pain: 0 Response to Treatment: Procedure was tolerated well Level of Consciousness (Post- Awake and Alert procedure): MABIMAEL, ZEITER([id number] Page 2 of 9 Post Procedure Diagnosis Same as Pre-procedure Electronic Signature(s) Signed: 02/07/2022 12:14:36 PM By: HKalman ShanDO Signed: 02/07/2022 1:57:45 PM By: DDeon PillingRN, BSN Entered By: DDeon Pillingon 02/07/2022 10:14:09 -------------------------------------------------------------------------------- HPI Details Patient Name: Date of Service: [name] 02/07/2022 9:15 A M Medical Record Number: [id number] Account Number: [id number] of Birth/Sex: Treating RN: 3Dec 20, 1967(57y.o. M) Primary Care Provider: PKathlene NovemberOther Clinician: Referring Provider: Treating Provider/Extender: HFreddi StarrWeeks in Treatment: 476History of Present Illness HPI Description: Admission 03/28/2021 Mr. CAbayomi Pattisonis a 56year old male with a past medical history of controlled type 2 diabetes on oral agents, obesity and OSA that presents to the clinic for a back wound. On 01/11/2021 patient had a laminectomy with PLIF of L1-S1 by Dr. OVenetia Constablebecause of lumbar stenosis and radiculopathy. He subsequently developed bacteremia. He had CT imaging on 10/13 of the lumbar spine that showed fluid collection in the soft tissue of the posterior L1 and S1 and was taken to the OR for washout on 10/14. 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 He received 4 weeks of IV antibiotics by infectious disease. After his completion of 4 weeks of IV antibiotics he was continued for an additional 4 weeks of IV cefazolin with a stop date of 12/29. He has been evaluated by plastic surgery and no plans for surgical intervention at this time. Wife is present and reports he has been on the wound VAC for the past 8 weeks with improvement in wound healing.  He currently denies systemic signs of infection. 12/22; patient presents for follow-up. He reports no issues since last clinic visit. He denies signs of infection. He has been tolerating the wound VAC well. 12/30; patient presents for follow-up. He reports no issues and  has no complaints today. He has been tolerating the wound VAC well. 1/9; patient presents for follow-up. He has no issues or complaints today. He states he feels well. He has had no problems with the wound VAC. 1/16; patient presents for follow-up. He continues to use the wound VAC with no issues. He denies signs of infection. 1/23; patient presents for follow-up. He has been switched from IV cefazolin to oral cefadroxil by infectious disease. He has no issues or complaints today. He denies signs of infection. He continues to tolerate the wound VAC well. 1/30; patient presents for follow-up. He continues to tolerate the wound VAC well. 2/6; patient presents for follow-up. He has no issues or complaints today. He continues to tolerate the wound VAC well. He denies signs of infection. 2/13; patient presents for follow-up. He continues to do well with the wound VAC. He denies any issues. 2/27; patient presents for follow-up. He continues to use the wound VAC without any issues. He denies signs of infection. 3/20; patient presents for follow-up. He has no issues or complaints today. He continues to use the wound VAC. 4/3; patient presents for follow-up. He continues to use the wound VAC without issues. He denies signs of infection. 4/17; 2-week follow-up. He continues to do well. His measurements are improved. Initially a surgical wound complicated by infection. 5/1; patient presents for follow-up. He has no issues or complaints today. He continues to tolerate the wound VAC well. He denies signs of infection. 5/15; patient presents for follow-up. He has noted more maceration to the periwound. He has been using the wound VAC without issues.  He currently denies signs of infection. 5/30; patient presents for follow-up. He has been tolerating the wound VAC well over the past 2 weeks. He no longer has maceration to the periwound. He has no issues or complaints today. 6/8; this patient with a postsurgical wound that was complicated by postop infection. He has been using silver collagen under wound VAC and gradually doing well improvement in dimensions especially the tunnel at 12:00 6/22; patient presents for follow-up. We have been using silver collagen under the wound VAC. Patient has no issues or questions today. 7/6; patient presents for follow-up. Patient continues to use collagen under the wound VAC with no issues. 7/20; patient presents for follow-up. He has been using collagen under the wound VAC with no issues. He has been approved for Epicord. This was discussed with the patient and he is in agreement to having this placed at next clinic visit. 7/27; patient presents for follow-up. He has been tolerating the wound VAC well with collagen underneath. He has been approved for epi cord and we have this to place in office today. KERIM, STATZER ([id number]) [id number] Page 3 of 9 8/4; patient presents for follow-up. He had Epicort placed at last clinic visit. We held off on the wound VAC. He tolerated this well. He has no issues or complaints today. 8/11; patient presents for follow-up. Epicord was placed at last clinic visit. He had the wound VAC on for the past week. He states that no drainage was suctioned into the canister. He has slight maceration to the periwound. 8/18; patient presents for follow-up. Epicord #3 was placed in standard fashion last week. The wound VAC was held. He has no issues or complaints today. 8/25; Patient presents for follow-up. Epicord #4 was placed in standard fashion last week. The wound VAC was held again. He has no issues or complaints today. There has been improvement in  wound healing. 9/1; patient presents for follow-up. Epicort #5 was placed in standard fashion last week. He has no issues or complaints today. 9/8; patient presents for follow-up. Epicord #6 was placed in standard fashion last week. He tolerated this well and has no issues or complaints today. 9/15; Epicort No. 7 the wound looks healthy. Perhaps some hyper granulated tissue in the lower 50% of the longitudinal wound 9/22; Epicord #8 placed at last clinic visit. Wound appears well-healing. Patient has no issues or complaints today. 9/28. Epicord No. 9. Continued improvement. 10/6; patient has completed the allotted epicord Applications that insurance would cover. Insurance denied extending more applications. Patient has no issues or complaints today. He tolerated the last application well. 10/13; patient presents for follow-up. We have been using endoform to the wound bed over the past week. He has been approved for 5 more applications of Epicord. 10/20; patient presents for follow-up. Epicort was placed at last clinic visit. He has no issues or complaints today. He denies signs of infection. 10/27; patient presents for follow-up. Epicort was placed in standard fashion at last clinic visit. He has no issues or complaints today. He denies signs of infection. Electronic Signature(s) Signed: 02/07/2022 12:14:36 PM By: Kalman Shan DO Entered By: Kalman Shan on 02/07/2022 11:14:47 -------------------------------------------------------------------------------- Physical Exam Details Patient Name: Date of Service: CHRISS, MANNAN 02/07/2022 9:15 A M Medical Record Number: [id number] Patient Account Number: [id number] Date of Birth/Sex: Treating RN: 04/12/1966 (56 y.o. M) Primary Care Provider: [name] Other Clinician: Referring Provider: Treating Provider/Extender: [name] Weeks in Treatment: 45 Constitutional respirations regular, non-labored and within target range  for patient.. Cardiovascular 2+ dorsalis pedis/posterior tibialis pulses. Psychiatric pleasant and cooperative. Notes Wound exam; lumbar spine surgical incision site. Tunnel at the 12 o'clock position About 5 cm. He has granulation tissue With minimal slough in all wound areas no signs of infection. Electronic Signature(s) Signed: 02/07/2022 12:14:36 PM By: Kalman Shan DO Entered By: Kalman Shan on 02/07/2022 11:15:14 Rolly Salter ([id number]) [id number] Page 4 of 9 -------------------------------------------------------------------------------- Physician Orders Details Patient Name: Date of Service: RONELLE, SMALLMAN 02/07/2022 9:15 A M Medical Record Number: [id number] Patient Account Number: [id number] Date of Birth/Sex: Treating RN: Jun 13, 1965 (56 y.o. 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 - Friday Dr. Heber [place] Return A ppointment in 2 weeks. - Friday Dr. Heber  Return A Anesthetic (In clinic) Topical Lidocaine 5% applied to wound bed Cellular or Tissue Based Products Cellular or Tissue Based Product Type: - Epicord #3 applied 11/22/21 Epicord #4 11/29/21 Epicord #5 12/06/2021 Epicord #6 12/13/21 Epicord # 7 12/20/21 Epicord # 8 12/27/21 Epicord # 9 01/03/22 Epicord # 10 01/09/22 Epicord # 11 01/24/22 Epicord # 12 01/31/22 Epicord #13 02/07/2022 daptic or Mepitel. (DO NOT REMOVE). - leave Cellular or Tissue Based Product applied to wound bed, secured with steri-strips, cover with A the adaptic and steri-strips in place. Bathing/ Shower/  Hygiene May shower with protection but do not get wound dressing(s) wet. Additional Orders / Instructions Follow Nutritious Diet - Continue to monitor blood sugars daily Wound Treatment Wound #1 - Back Wound Laterality: Midline Peri-Wound Care: Skin Prep Every Other Day/30 Days Discharge Instructions:  Use skin prep as directed Secondary Dressing: ADAPTIC TOUCH 3x4.25 in Every Other Day/30 Days Discharge Instructions: Applied over primary dressing secured with steri-strips as directed. Secondary Dressing: Woven Gauze Sponge, Non-Sterile 4x4 in Every Other Day/30 Days Discharge Instructions: Apply over primary dressing as directed. Secondary Dressing: Zetuvit Plus Silicone Border Dressing 7x7(in/in) (Generic) Every Other Day/30 Days Discharge Instructions: Apply silicone border over primary dressing as directed. Electronic Signature(s) Signed: 02/07/2022 12:14:36 PM By: Kalman Shan DO Entered By: Kalman Shan on 02/07/2022 11:15:44 -------------------------------------------------------------------------------- Problem List Details Patient Name: Date of Service: [name] 02/07/2022 9:15 A TANUJ, MULLENS ([id number]) [id number] Page 5 of 9 Medical Record Number: [id number] Patient Account Number: [id number] Date of Birth/Sex: Treating RN: 06-Apr-1966 (56 y.o. 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 02/07/2022 9:15 A M Medical Record Number: [id number] Patient Account Number: [id number] Date of Birth/Sex: Treating RN: [birth year] (56 y.o. M) Primary Care Provider: [name] Other Clinician: Referring Provider: Treating Provider/Extender: [name] Weeks in Treatment: 36 Subjective Chief Complaint Information obtained from Patient 03/28/2021; Back wound History of Present Illness (HPI) Admission 03/28/2021 Mr. Niko Penson is a 56 year old male with a past medical history of controlled type 2 diabetes on oral agents, obesity and OSA that presents to the clinic for a back wound. On 01/11/2021 patient had a laminectomy with PLIF of L1-S1 by Dr. Venetia Constable because of lumbar stenosis and radiculopathy. He subsequently developed bacteremia. He had CT imaging on 10/13 of the lumbar spine that showed fluid collection in the soft tissue of the posterior L1 and S1 and was taken to the OR for washout on 10/14. 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 He received 4 weeks of IV antibiotics by infectious disease. After his completion of 4 weeks of IV antibiotics he was continued for an additional 4 weeks of IV cefazolin with a stop date of 12/29. He has been evaluated by plastic surgery and no plans for surgical intervention at this time. Wife is present and reports he has been on the wound VAC for the past 8 weeks with improvement in wound healing. He currently denies systemic signs of infection. 12/22; patient presents for follow-up. He reports no issues since last clinic visit. He denies signs of infection. He has been tolerating the wound VAC well. 12/30; patient presents for follow-up. He reports no issues and has no complaints today. He has been tolerating the wound VAC  well. 1/9; patient presents for follow-up. He has no issues or complaints today. He states he feels well. He has had no problems with the wound VAC. 1/16; patient presents for follow-up. He continues to use the wound VAC with no issues. He denies signs of infection. 1/23; patient presents for follow-up. He has been switched from IV cefazolin to oral cefadroxil by infectious disease. He has no issues or complaints today. He denies signs of infection. He continues to tolerate the wound VAC well. Mckee, Cinque  E ([id number]) [id number] Page 6 of 9 1/30; patient presents for follow-up. He continues to tolerate the wound VAC well. 2/6; patient presents for follow-up. He has no issues or complaints today. He continues to tolerate the wound VAC well. He denies signs of infection. 2/13; patient presents for follow-up. He continues to do well with the wound VAC. He denies any issues. 2/27; patient presents for follow-up. He continues to use the wound VAC without any issues. He denies signs of infection. 3/20; patient presents for follow-up. He has no issues or complaints today. He continues to use the wound VAC. 4/3; patient presents for follow-up. He continues to use the wound VAC without issues. He denies signs of infection. 4/17; 2-week follow-up. He continues to do well. His measurements are improved. Initially a surgical wound complicated by infection. 5/1; patient presents for follow-up. He has no issues or complaints today. He continues to tolerate the wound VAC well. He denies signs of infection. 5/15; patient presents for follow-up. He has noted more maceration to the periwound. He has been using the wound VAC without issues. He currently denies signs of infection. 5/30; patient presents for follow-up. He has been tolerating the wound VAC well over the past 2 weeks. He no longer has maceration to the periwound. He has no issues or complaints today. 6/8; this patient with a  postsurgical wound that was complicated by postop infection. He has been using silver collagen under wound VAC and gradually doing well improvement in dimensions especially the tunnel at 12:00 6/22; patient presents for follow-up. We have been using silver collagen under the wound VAC. Patient has no issues or questions today. 7/6; patient presents for follow-up. Patient continues to use collagen under the wound VAC with no issues. 7/20; patient presents for follow-up. He has been using collagen under the wound VAC with no issues. He has been approved for Epicord. This was discussed with the patient and he is in agreement to having this placed at next clinic visit. 7/27; patient presents for follow-up. He has been tolerating the wound VAC well with collagen underneath. He has been approved for epi cord and we have this to place in office today. 8/4; patient presents for follow-up. He had Epicort placed at last clinic visit. We held off on the wound VAC. He tolerated this well. He has no issues or complaints today. 8/11; patient presents for follow-up. Epicord was placed at last clinic visit. He had the wound VAC on for the past week. He states that no drainage was suctioned into the canister. He has slight maceration to the periwound. 8/18; patient presents for follow-up. Epicord #3 was placed in standard fashion last week. The wound VAC was held. He has no issues or complaints today. 8/25; Patient presents for follow-up. Epicord #4 was placed in standard fashion last week. The wound VAC was held again. He has no issues or complaints today. There has been improvement in wound healing. 9/1; patient presents for follow-up. Epicort #5 was placed in standard fashion last week. He has no issues or complaints today. 9/8; patient presents for follow-up. Epicord #6 was placed in standard fashion last week. He tolerated this well and has no issues or complaints today. 9/15; Epicort No. 7 the wound looks  healthy. Perhaps some hyper granulated tissue in the lower 50% of the longitudinal wound 9/22; Epicord #8 placed at last clinic visit. Wound appears well-healing. Patient has no issues or complaints today. 9/28. Epicord No. 9. Continued improvement. 10/6; patient has completed  the allotted epicord Applications that insurance would cover. Insurance denied extending more applications. Patient has no issues or complaints today. He tolerated the last application well. 10/13; patient presents for follow-up. We have been using endoform to the wound bed over the past week. He has been approved for 5 more applications of Epicord. 10/20; patient presents for follow-up. Epicort was placed at last clinic visit. He has no issues or complaints today. He denies signs of infection. 10/27; patient presents for follow-up. Epicort was placed in standard fashion at last clinic visit. He has no issues or complaints today. He denies signs of infection. Patient History Information obtained from Patient. Family History Diabetes - Mother, Stroke - Siblings, No family history of Cancer, Heart Disease, Hereditary Spherocytosis, Hypertension, Kidney Disease, Lung Disease, Seizures, Thyroid Problems, Tuberculosis. Social History Never smoker, Marital Status - Married, Alcohol Use - Rarely, Drug Use - No History, Caffeine Use - Rarely. Medical History Cardiovascular Patient has history of Hypertension Endocrine Patient has history of Type II Diabetes Musculoskeletal Patient has history of Osteomyelitis Medical A Surgical History Notes nd Musculoskeletal DDD XZAVIAN, SEMMEL ([id number]) [id number] Page 7 of 9 Objective Constitutional respirations regular, non-labored and within target range for patient.. Vitals Time Taken: 9:33 AM, Temperature: 97.7 F, Pulse: 74 bpm, Respiratory Rate: 17 breaths/min, Blood Pressure: 157/74 mmHg. Cardiovascular 2+ dorsalis pedis/posterior tibialis  pulses. Psychiatric pleasant and cooperative. General Notes: Wound exam; lumbar spine surgical incision site. Tunnel at the 12 o'clock position About 5 cm. He has granulation tissue With minimal slough in all wound areas no signs of infection. Integumentary (Hair, Skin) Wound #1 status is Open. Original cause of wound was Surgical Injury. The date acquired was: 01/11/2021. The wound has been in treatment 45 weeks. The wound is located on the Midline Back. The wound measures 3.7cm length x 0.4cm width x 0.3cm depth; 1.162cm^2 area and 0.349cm^3 volume. There is Fat Layer (Subcutaneous Tissue) exposed. There is no undermining noted, however, there is tunneling at 12:00 with a maximum distance of 5cm. There is a medium amount of serosanguineous drainage noted. The wound margin is distinct with the outline attached to the wound base. There is large (67-100%) red granulation within the wound bed. There is no necrotic tissue within the wound bed. The periwound skin appearance did not exhibit: Callus, Crepitus, Excoriation, Induration, Rash, Scarring, Dry/Scaly, Maceration, Atrophie Blanche, Cyanosis, Ecchymosis, Hemosiderin Staining, Mottled, Pallor, Rubor, Erythema. Periwound temperature was noted as No Abnormality. 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 No signs of infection. Epicord #13 was placed in standard fashion today. Follow-up in 1 week. Procedures Wound #1 Pre-procedure diagnosis of Wound #1 is an Open Surgical Wound located on the Midline Back. A skin graft procedure using a bioengineered skin substitute/cellular or tissue based product was performed by Kalman Shan, DO with the following instrument(s): Forceps and Scissors. Epicord was applied and secured with Steri-Strips and adaptic. 6 sq cm of product was utilized and  0 sq cm was wasted. Post Application, no dressing was applied. A Time Out was conducted at 10:10, prior to the start of the procedure. The procedure was tolerated well with a pain level of 0 throughout and a pain level of 0 following the procedure. Post procedure Diagnosis Wound #1: Same as Pre-Procedure . Plan Follow-up Appointments: Return Appointment in 1 week. - Friday Dr. Heber [place] Return Appointment in 2 weeks. - Friday Dr.  Adeel Guiffre Anesthetic: (In clinic) Topical Lidocaine 5% applied to wound bed Cellular or Tissue Based Products: Cellular or Tissue Based Product Type: - Epicord #3 applied 11/22/21 Epicord #4 11/29/21 Epicord #5 12/06/2021 Epicord #6 12/13/21 Epicord # 7 12/20/21 Epicord # 8 12/27/21 Epicord # 9 01/03/22 Epicord # 10 01/09/22 Epicord # 11 01/24/22 Epicord # 12 01/31/22 Epicord #13 02/07/2022 Cellular or Tissue Based Product applied to wound bed, secured with steri-strips, cover with Adaptic or Mepitel. (DO NOT REMOVE). - leave the adaptic and steri-strips in place. Bathing/ Shower/ Hygiene: May shower with protection but do not get wound dressing(s) wet. Additional Orders / Instructions: Follow Nutritious Diet - Continue to monitor blood sugars daily RUVEN, CORRADI ([id number]) [id number] Page 8 of 9 WOUND #1: - Back Wound Laterality: Midline Peri-Wound Care: Skin Prep Every Other Day/30 Days Discharge Instructions: Use skin prep as directed Secondary Dressing: ADAPTIC TOUCH 3x4.25 in Every Other Day/30 Days Discharge Instructions: Applied over primary dressing secured with steri-strips as directed. Secondary Dressing: Woven Gauze Sponge, Non-Sterile 4x4 in Every Other Day/30 Days Discharge Instructions: Apply over primary dressing as directed. Secondary Dressing: Zetuvit Plus Silicone Border Dressing 7x7(in/in) (Generic) Every Other Day/30 Days Discharge Instructions: Apply silicone border over primary dressing as directed. 1. Epicord #13  was placed in standard fashion 2. Follow-up in 1 week Electronic Signature(s) Signed: 02/07/2022 12:14:36 PM By: Kalman Shan DO Entered By: Kalman Shan on 02/07/2022 11:16:54 -------------------------------------------------------------------------------- HxROS Details Patient Name: Date of Service: Christian Brown E. 02/07/2022 9:15 A M Medical Record Number: [id number] Patient Account Number: [id number] Date of Birth/Sex: Treating RN: 1966/02/23 (56 y.o. M) Primary Care Provider: [name] Other Clinician: Referring Provider: Treating Provider/Extender: [name] Weeks in Treatment: 26 Information Obtained From Patient Cardiovascular Medical History: Positive for: Hypertension Endocrine Medical History: Positive for: Type II Diabetes Time with diabetes: Since mid 90's Treated with: Oral agents Blood sugar tested every day: Yes Tested : 2x day Musculoskeletal Medical History: Positive for: Osteomyelitis Past Medical History Notes: DDD Immunizations Pneumococcal Vaccine: Received Pneumococcal Vaccination: Yes Received Pneumococcal Vaccination On or After 60th Birthday: No Implantable Devices Yes Family and Social History Cancer: No; Diabetes: Yes - Mother; Heart Disease: No; Hereditary Spherocytosis: No; Hypertension: No; Kidney Disease: No; Lung Disease: No; Seizures: No; Stroke: Yes - Siblings; Thyroid Problems: No; Tuberculosis: No; Never smoker; Marital Status - Married; Alcohol Use: Rarely; Drug Use: No History; Caffeine Use: Rarely; Financial Concerns: No; Food, Clothing or Shelter Needs: No; Support System Lacking: No; Transportation Concerns: No Electronic Signature(s) Signed: 02/07/2022 12:14:36 PM By: Smitty Pluck ([id number]) [id number] Page 9 of 9 Entered By: Kalman Shan on 02/07/2022  11:14:53 -------------------------------------------------------------------------------- SuperBill Details Patient Name: Date of Service: [name], [name] 02/07/2022 Medical Record Number: [id number] Patient Account Number: [id number] Date of Birth/Sex: Treating RN: 03-30-1966 (56 y.o. 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

## 2022-02-14 ENCOUNTER — Encounter (HOSPITAL_BASED_OUTPATIENT_CLINIC_OR_DEPARTMENT_OTHER): Payer: No Typology Code available for payment source | Attending: Internal Medicine | Admitting: Internal Medicine

## 2022-02-14 ENCOUNTER — Other Ambulatory Visit (HOSPITAL_COMMUNITY): Payer: Self-pay

## 2022-02-14 DIAGNOSIS — M869 Osteomyelitis, unspecified: Secondary | ICD-10-CM | POA: Diagnosis not present

## 2022-02-14 DIAGNOSIS — E669 Obesity, unspecified: Secondary | ICD-10-CM | POA: Diagnosis not present

## 2022-02-14 DIAGNOSIS — I10 Essential (primary) hypertension: Secondary | ICD-10-CM | POA: Diagnosis not present

## 2022-02-14 DIAGNOSIS — E11622 Type 2 diabetes mellitus with other skin ulcer: Secondary | ICD-10-CM | POA: Diagnosis not present

## 2022-02-14 DIAGNOSIS — G4733 Obstructive sleep apnea (adult) (pediatric): Secondary | ICD-10-CM | POA: Diagnosis not present

## 2022-02-14 DIAGNOSIS — E1151 Type 2 diabetes mellitus with diabetic peripheral angiopathy without gangrene: Secondary | ICD-10-CM | POA: Insufficient documentation

## 2022-02-14 DIAGNOSIS — L98424 Non-pressure chronic ulcer of back with necrosis of bone: Secondary | ICD-10-CM | POA: Diagnosis not present

## 2022-02-15 NOTE — Progress Notes (Signed)
Christian, Murphy ([id number]) [id number] Page 1 of 8 Visit Report for 02/14/2022 Cellular or Tissue Based Product Details Patient Name: Date of Service: [name], [name] 02/14/2022 9:00 A M Medical Record Number: [id number] Patient Account Number: [id number] Date of Birth/Sex: Treating RN: 1965/06/16 (56 y.o. M) Primary Care Provider: [name] Other Clinician: Referring Provider: Treating Provider/Extender: [name] in Treatment: 46 Cellular or Tissue Based Product Type Wound #1 Midline Back Applied to: Performed By: Physician Ricard Dillon., MD Cellular or Tissue Based Product Type: Epicord Level of Consciousness (Pre-procedure): Awake and Alert Pre-procedure Verification/Time Out Yes - 09:15 Taken: Location: trunk / arms / legs Wound Size (sq cm): 1.15 Product Size (sq cm): 6 Waste Size (sq cm): 0 Amount of Product Applied (sq cm): 6 Instrument Used: Forceps, Scissors Lot #: [id number] Order #: 13 Expiration Date: 10/13/2026 Fenestrated: No Reconstituted: Yes Solution Type: Normal Saline Solution Amount: 13m Lot #: [id number] Expiration Date: 08/11/2024 Secured: Yes Secured With: Steri-Strips, adaptic Dressing Applied: No Procedural Pain: 0 Post Procedural Pain: 0 Response to Treatment: Procedure was tolerated well Level of Consciousness (Post- Awake and Alert procedure): Post Procedure Diagnosis Same as Pre-procedure Electronic Signature(s) Signed: 02/14/2022 12:29:37 PM By: RLinton HamMD Entered By: RLinton Hamon 02/14/2022 09:27:34 -------------------------------------------------------------------------------- HPI Details Patient Name: Date of Service: [name] 02/14/2022 9:00 A M Medical Record Number: [id number] Account Number: [id number] of Birth/Sex: Treating RN: 3October 14, 1967(56y.o. M) Primary Care Provider: PKathlene NovemberOther Clinician: Referring Provider: Treating  Provider/Extender: RCarlyle Basquesin Treatment: [id number]History of Present Illness Christian, Murphy([id number] Page 2 of 8 HPI Description: Admission 03/28/2021 Mr. CKayvan Hoeflingis a 56year old male with a past medical history of controlled type 2 diabetes on oral agents, obesity and OSA that presents to the clinic for a back wound. On 01/11/2021 patient had a laminectomy with PLIF of L1-S1 by Dr. OVenetia Constablebecause of lumbar stenosis and radiculopathy. He subsequently developed bacteremia. He had CT imaging on 10/13 of the lumbar spine that showed fluid collection in the soft tissue of the posterior L1 and S1 and was taken to the OR for washout on 10/14. 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 He received 4 weeks of IV antibiotics by infectious disease. After his completion of 4 weeks of IV antibiotics he was continued for an additional 4 weeks of IV cefazolin with a stop date of 12/29. He has been evaluated by plastic surgery and no plans for surgical intervention at this time. Wife is present and reports he has been on the wound VAC for the past 8 weeks with improvement in wound healing. He currently denies systemic signs of infection. 12/22; patient presents for follow-up. He reports no issues since last clinic visit. He denies signs of infection. He has been tolerating the wound VAC well. 12/30; patient presents for follow-up. He reports no issues and has no complaints today. He has been tolerating the wound VAC well. 1/9; patient presents for follow-up. He has no issues or complaints today. He states he feels well. He has had no problems with the wound VAC. 1/16; patient presents for follow-up. He continues to use the wound VAC with no issues. He denies signs of infection. 1/23; patient presents for follow-up. He has been switched from IV cefazolin to oral cefadroxil by infectious disease. He has no issues or  complaints today. He denies signs of infection. He continues  to tolerate the wound VAC well. 1/30; patient presents for follow-up. He continues to tolerate the wound VAC well. 2/6; patient presents for follow-up. He has no issues or complaints today. He continues to tolerate the wound VAC well. He denies signs of infection. 2/13; patient presents for follow-up. He continues to do well with the wound VAC. He denies any issues. 2/27; patient presents for follow-up. He continues to use the wound VAC without any issues. He denies signs of infection. 3/20; patient presents for follow-up. He has no issues or complaints today. He continues to use the wound VAC. 4/3; patient presents for follow-up. He continues to use the wound VAC without issues. He denies signs of infection. 4/17; 2-week follow-up. He continues to do well. His measurements are improved. Initially a surgical wound complicated by infection. 5/1; patient presents for follow-up. He has no issues or complaints today. He continues to tolerate the wound VAC well. He denies signs of infection. 5/15; patient presents for follow-up. He has noted more maceration to the periwound. He has been using the wound VAC without issues. He currently denies signs of infection. 5/30; patient presents for follow-up. He has been tolerating the wound VAC well over the past 2 weeks. He no longer has maceration to the periwound. He has no issues or complaints today. 6/8; this patient with a postsurgical wound that was complicated by postop infection. He has been using silver collagen under wound VAC and gradually doing well improvement in dimensions especially the tunnel at 12:00 6/22; patient presents for follow-up. We have been using silver collagen under the wound VAC. Patient has no issues or questions today. 7/6; patient presents for follow-up. Patient continues to use collagen under the wound VAC with no issues. 7/20; patient presents for follow-up. He has  been using collagen under the wound VAC with no issues. He has been approved for Epicord. This was discussed with the patient and he is in agreement to having this placed at next clinic visit. 7/27; patient presents for follow-up. He has been tolerating the wound VAC well with collagen underneath. He has been approved for epi cord and we have this to place in office today. 8/4; patient presents for follow-up. He had Epicort placed at last clinic visit. We held off on the wound VAC. He tolerated this well. He has no issues or complaints today. 8/11; patient presents for follow-up. Epicord was placed at last clinic visit. He had the wound VAC on for the past week. He states that no drainage was suctioned into the canister. He has slight maceration to the periwound. 8/18; patient presents for follow-up. Epicord #3 was placed in standard fashion last week. The wound VAC was held. He has no issues or complaints today. 8/25; Patient presents for follow-up. Epicord #4 was placed in standard fashion last week. The wound VAC was held again. He has no issues or complaints today. There has been improvement in wound healing. 9/1; patient presents for follow-up. Epicort #5 was placed in standard fashion last week. He has no issues or complaints today. 9/8; patient presents for follow-up. Epicord #6 was placed in standard fashion last week. He tolerated this well and has no issues or complaints today. 9/15; Epicort No. 7 the wound looks healthy. Perhaps some hyper granulated tissue in the lower 50% of the longitudinal wound 9/22; Epicord #8 placed at last clinic visit. Wound appears well-healing. Patient has no issues or complaints today. 9/28. Epicord No. 9. Continued improvement. 10/6; patient has completed the allotted  epicord Applications that insurance would cover. Insurance denied extending more applications. Patient has no issues or complaints today. He tolerated the last application well. 10/13; patient  presents for follow-up. We have been using endoform to the wound bed over the past week. He has been approved for 5 more applications of Epicord. 10/20; patient presents for follow-up. Epicort was placed at last clinic visit. He has no issues or complaints today. He denies signs of infection. 10/27; patient presents for follow-up. Epicort was placed in standard fashion at last clinic visit. He has no issues or complaints today. He denies signs of infection. 11/3; Epicort No. 13. Tunneling depth at 12:00 is 2.5 cm as measured by myself GILMAR, BUA ([id number]) [id number] Page 3 of 8 Electronic Signature(s) Signed: 02/14/2022 12:29:37 PM By: Linton Ham MD Entered By: Linton Ham on 02/14/2022 09:47:09 -------------------------------------------------------------------------------- Physical Exam Details Patient Name: Date of Service: Christian Brown E. 02/14/2022 9:00 A M Medical Record Number: [id number] Patient Account Number: [id number] Date of Birth/Sex: Treating RN: [birth year] (56 y.o. M) Primary Care Provider: [name] Other Clinician: Referring Provider: Treating Provider/Extender: [name] in Treatment: 31 Constitutional Patient is hypertensive.. Pulse regular and within target range for patient.Marland Kitchen Respirations regular, non-labored and within target range.. Temperature is normal and within the target range for the patient.Marland Kitchen Appears in no distress. Notes Wound exam; lumbar spine surgical site. The tunnel at 12:00 is 2.5 cm which is about half the measurement from last week. I measured this myself. The base of where I am measuring appears firm so I believe the measurement. The more superficial parts of this wound appear healthy. Epicort was applied in the standard fashion Electronic Signature(s) Signed: 02/14/2022 12:29:37 PM By: Linton Ham MD Entered By: Linton Ham on 02/14/2022  09:29:26 -------------------------------------------------------------------------------- Physician Orders Details Patient Name: Date of Service: Christian Brown E. 02/14/2022 9:00 A M Medical Record Number: [id number] Patient Account Number: [id number] Date of Birth/Sex: Treating RN: 12/25/1965 (56 y.o. 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 - Friday Dr. Heber [place] Return A ppointment in 2 weeks. - Friday Dr. Heber [place] Return A Anesthetic (In clinic) Topical Lidocaine 5% applied to wound bed Cellular or Tissue Based Products Cellular or Tissue Based Product Type: - Epicord #3 applied 11/22/21 Epicord #4 11/29/21 Epicord #5 12/06/2021 Epicord #6 12/13/21 Epicord # 7 12/20/21 Epicord # 8 12/27/21 Leise, Ofilia Neas ([id number]) [id number] Page 4 of 8 Epicord # 9 01/03/22 Epicord # 10 01/09/22 Epicord # 11 01/24/22 Epicord # 12 01/31/22 Epicord #13 02/07/2022 Epicord #14 02/14/2022 daptic or Mepitel. (DO NOT REMOVE). - leave Cellular or Tissue Based Product applied to wound bed, secured with steri-strips, cover with A the adaptic and steri-strips in place. Bathing/ Shower/ Hygiene May shower with protection but do not get wound dressing(s) wet. Additional Orders / Instructions Follow Nutritious Diet - Continue to monitor blood sugars daily Wound Treatment Wound #1 - Back Wound Laterality: Midline Peri-Wound Care: Skin Prep Every Other Day/30 Days Discharge Instructions: Use skin prep as directed Secondary Dressing: ADAPTIC TOUCH 3x4.25 in Every Other Day/30  Days Discharge Instructions: Applied over primary dressing secured with steri-strips as directed. Secondary Dressing: Woven Gauze Sponge, Non-Sterile  4x4 in Every Other Day/30 Days Discharge Instructions: Apply over primary dressing as directed. Secondary Dressing: Zetuvit Plus Silicone Border Dressing 7x7(in/in) (Generic) Every Other Day/30 Days Discharge Instructions: Apply silicone border over primary dressing as directed. Electronic Signature(s) Signed: 02/14/2022 12:29:37 PM By: Linton Ham MD Signed: 02/14/2022 5:25:29 PM By: Deon Pilling RN, BSN Entered By: Deon Pilling on 02/14/2022 09:22:10 -------------------------------------------------------------------------------- Problem List Details Patient Name: Date of Service: Christian Brown E. 02/14/2022 9:00 A M Medical Record Number: [id number] Patient Account Number: [id number] Date of Birth/Sex: Treating RN: Jul 02, 1965 (56 y.o. 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 02/14/2022 9:00 A M Medical Record Number: [id number] Patient Account Number: [id number] Date of Birth/Sex: Treating RN: January 26, 1966 (56 y.o. M) Primary Care Provider: [name] Other Clinician: Referring Provider: Treating Provider/Extender: [name] in Treatment: 46 Subjective History of Present Illness (HPI) Admission 03/28/2021 Mr. Sheffield Hawker is a 56 year old male with a past medical history of controlled type 2 diabetes on oral agents, obesity and OSA that presents to the clinic for a back wound. On 01/11/2021 patient had a laminectomy with PLIF of L1-S1 by Dr. Venetia Constable because of lumbar stenosis and radiculopathy. He subsequently developed bacteremia. He had CT imaging on 10/13 of the lumbar spine that showed fluid collection in the soft tissue of the posterior L1 and S1 and was taken to the OR for washout on 10/14. 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 He received 4 weeks of IV antibiotics by infectious disease. After his completion of 4 weeks of IV antibiotics he was continued for an additional 4 weeks of IV cefazolin with a stop date of 12/29. He has been evaluated by plastic surgery and no plans for surgical intervention at this time. Wife is present and reports he has been on the wound VAC for the past 8 weeks with improvement in wound healing. He currently denies systemic signs of infection. 12/22; patient presents for follow-up. He reports no issues since last clinic visit. He denies signs of infection. He has been tolerating the wound VAC well. 12/30; patient presents for follow-up. He reports no issues and has no complaints today. He has been tolerating the wound VAC well. 1/9; patient presents for follow-up. He has no issues or complaints today. He states he feels well. He has had no problems with the wound VAC. 1/16; patient presents for follow-up. He continues to use the wound VAC with no issues. He denies signs of infection. 1/23; patient presents for follow-up. He has been switched from IV cefazolin to oral cefadroxil by infectious disease. He has no issues or complaints today. He denies  signs of infection. He continues to tolerate the wound VAC well. 1/30; patient presents for follow-up. He continues to tolerate the wound VAC well. 2/6; patient presents for follow-up. He has no issues or complaints today. He continues to tolerate the wound VAC well. He  denies signs of infection. 2/13; patient presents for follow-up. He continues to do well with the wound VAC. He denies any issues. 2/27; patient presents for follow-up. He continues to use the wound VAC without any issues. He denies signs of infection. 3/20; patient presents for follow-up. He has no issues or complaints today. He continues to use the wound VAC. 4/3; patient presents for follow-up. He continues to use the wound VAC without issues. He denies signs of infection. 4/17; 2-week follow-up. He continues to do well. His measurements are improved. Initially a surgical wound complicated by infection. 5/1; patient presents for follow-up. He has no issues or complaints today. He continues to tolerate the wound VAC well. He denies signs of infection. 5/15; patient presents for follow-up. He has noted more maceration to the periwound. He has been using the wound VAC without issues. He currently denies signs of infection. 5/30; patient presents for follow-up. He has been tolerating the wound VAC well over the past 2 weeks. He no longer has maceration to the periwound. He has no issues or complaints today. 6/8; this patient with a postsurgical wound that was complicated by postop infection. He has been using silver collagen under wound VAC and gradually doing well improvement in dimensions especially the tunnel at 12:00 6/22; patient presents for follow-up. We have been using silver collagen under the wound VAC. Patient has no issues or questions today. 7/6; patient presents for follow-up. Patient continues to use collagen under the wound VAC with no issues. 7/20; patient presents for follow-up. He has been using collagen under the  wound VAC with no issues. He has been approved for Epicord. This was discussed with the patient and he is in agreement to having this placed at next clinic visit. 7/27; patient presents for follow-up. He has been tolerating the wound VAC well with collagen underneath. He has been approved for epi cord and we have this to place in office today. 8/4; patient presents for follow-up. He had Epicort placed at last clinic visit. We held off on the wound VAC. He tolerated this well. He has no issues or complaints today. 8/11; patient presents for follow-up. Epicord was placed at last clinic visit. He had the wound VAC on for the past week. He states that no drainage was suctioned into the canister. He has slight maceration to the periwound. 8/18; patient presents for follow-up. Epicord #3 was placed in standard fashion last week. The wound VAC was held. He has no issues or complaints today. SHAWNA, KIENER ([id number]) [id number] Page 6 of 8 8/25; Patient presents for follow-up. Epicord #4 was placed in standard fashion last week. The wound VAC was held again. He has no issues or complaints today. There has been improvement in wound healing. 9/1; patient presents for follow-up. Epicort #5 was placed in standard fashion last week. He has no issues or complaints today. 9/8; patient presents for follow-up. Epicord #6 was placed in standard fashion last week. He tolerated this well and has no issues or complaints today. 9/15; Epicort No. 7 the wound looks healthy. Perhaps some hyper granulated tissue in the lower 50% of the longitudinal wound 9/22; Epicord #8 placed at last clinic visit. Wound appears well-healing. Patient has no issues or complaints today. 9/28. Epicord No. 9. Continued improvement. 10/6; patient has completed the allotted epicord Applications that insurance would cover. Insurance denied extending more applications. Patient has no issues or complaints today. He  tolerated the last application well. 10/13; patient presents for follow-up. We  have been using endoform to the wound bed over the past week. He has been approved for 5 more applications of Epicord. 10/20; patient presents for follow-up. Epicort was placed at last clinic visit. He has no issues or complaints today. He denies signs of infection. 10/27; patient presents for follow-up. Epicort was placed in standard fashion at last clinic visit. He has no issues or complaints today. He denies signs of infection. 11/3; Epicort No. 13. Tunneling depth at 12:00 is 2.5 cm as measured by myself Objective Constitutional Patient is hypertensive.. Pulse regular and within target range for patient.Marland Kitchen Respirations regular, non-labored and within target range.. Temperature is normal and within the target range for the patient.Marland Kitchen Appears in no distress. Vitals Time Taken: 9:00 AM, Temperature: 97.8 F, Pulse: 103 bpm, Respiratory Rate: 18 breaths/min, Blood Pressure: 156/95 mmHg. General Notes: Wound exam; lumbar spine surgical site. The tunnel at 12:00 is 2.5 cm which is about half the measurement from last week. I measured this myself. The base of where I am measuring appears firm so I believe the measurement. The more superficial parts of this wound appear healthy. Epicort was applied in the standard fashion Integumentary (Hair, Skin) Wound #1 status is Open. Original cause of wound was Surgical Injury. The date acquired was: 01/11/2021. The wound has been in treatment 46 weeks. The wound is located on the Midline Back. The wound measures 2.3cm length x 0.5cm width x 0.4cm depth; 0.903cm^2 area and 0.361cm^3 volume. There is Fat Layer (Subcutaneous Tissue) exposed. There is no undermining noted, however, there is tunneling at 12:00 with a maximum distance of 1.6cm. There is a medium amount of serosanguineous drainage noted. The wound margin is distinct with the outline attached to the wound base. There is large  (67-100%) red granulation within the wound bed. There is a small (1-33%) amount of necrotic tissue within the wound bed including Adherent Slough. The periwound skin appearance did not exhibit: Callus, Crepitus, Excoriation, Induration, Rash, Scarring, Dry/Scaly, Maceration, Atrophie Blanche, Cyanosis, Ecchymosis, Hemosiderin Staining, Mottled, Pallor, Rubor, Erythema. Periwound temperature was noted as No Abnormality. 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 A skin graft procedure using a bioengineered skin substitute/cellular or tissue based product was performed by Ricard Dillon., MD with the following instrument(s): Forceps and Scissors. Epicord was applied and secured with Steri-Strips and adaptic. 6 sq cm of product was utilized and 0 sq cm was wasted. Post Application, no dressing was applied. A Time Out was conducted at 09:15, prior to the start of the procedure. The procedure was tolerated well with a pain level of 0 throughout and a pain level of 0 following the procedure. Post procedure Diagnosis Wound #1: Same as Pre-Procedure . ANDRAS, GRUNEWALD ([id number]) [id number] Page 7 of 8 Plan Follow-up Appointments: Return Appointment in 1 week. - Friday Dr. Heber [place] Return Appointment in 2 weeks. - Friday Dr. Heber [place] Anesthetic: (In clinic) Topical Lidocaine 5% applied to wound bed Cellular or Tissue Based Products: Cellular or Tissue Based Product Type: - Epicord #3 applied 11/22/21 Epicord #4 11/29/21 Epicord #5 12/06/2021 Epicord #6 12/13/21 Epicord # 7 12/20/21 Epicord # 8 12/27/21 Epicord # 9 01/03/22 Epicord # 10 01/09/22 Epicord # 11 01/24/22 Epicord # 12 01/31/22 Epicord #13 02/07/2022 Epicord #14 02/14/2022 Cellular  or Tissue Based Product applied to  wound bed, secured with steri-strips, cover with Adaptic or Mepitel. (DO NOT REMOVE). - leave the adaptic and steri-strips in place. Bathing/ Shower/ Hygiene: May shower with protection but do not get wound dressing(s) wet. Additional Orders / Instructions: Follow Nutritious Diet - Continue to monitor blood sugars daily WOUND #1: - Back Wound Laterality: Midline Peri-Wound Care: Skin Prep Every Other Day/30 Days Discharge Instructions: Use skin prep as directed Secondary Dressing: ADAPTIC TOUCH 3x4.25 in Every Other Day/30 Days Discharge Instructions: Applied over primary dressing secured with steri-strips as directed. Secondary Dressing: Woven Gauze Sponge, Non-Sterile 4x4 in Every Other Day/30 Days Discharge Instructions: Apply over primary dressing as directed. Secondary Dressing: Zetuvit Plus Silicone Border Dressing 7x7(in/in) (Generic) Every Other Day/30 Days Discharge Instructions: Apply silicone border over primary dressing as directed. 1. Epicort No. 13 in the standard fashion 2. No current evidence of infection no cultures were done 3. The patient was cautioned to keep pressure off this area especially when sleeping in bed at night Electronic Signature(s) Signed: 02/14/2022 12:29:37 PM By: Linton Ham MD Entered By: Linton Ham on 02/14/2022 09:30:05 -------------------------------------------------------------------------------- SuperBill Details Patient Name: Date of Service: [name] 02/14/2022 Medical Record Number: [id number] Patient Account Number: [id number] Date of Birth/Sex: Treating RN: 09-20-1965 (56 y.o. 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

## 2022-02-15 NOTE — Progress Notes (Signed)
[name], [name] ([id number]) [id number] Page 1 of 7 Visit Report for 02/14/2022 Arrival Information Details Patient Name: Date of Service: [name], [name] 02/14/2022 9:00 A M Medical Record Number: [id number] Patient Account Number: [id number] Date of Birth/Sex: Treating RN: Nov 02, 1965 (56 y.o. M) Primary Care Yuliana Vandrunen: [name] Other Clinician: Referring Tonesha Tsou: Treating Mackenze Grandison/Extender: [name] in Treatment: 46 Visit Information History Since Last Visit Added or deleted any medications: No Patient Arrived: Ambulatory Any new allergies or adverse reactions: No Arrival Time: 08:58 Had a fall or experienced change in No Accompanied By: self activities of daily living that may affect Transfer Assistance: None risk of falls: Patient Identification Verified: Yes Signs or symptoms of abuse/neglect since last visito No Secondary Verification Process Completed: Yes Hospitalized since last visit: No Patient Requires Transmission-Based Precautions: No Implantable device outside of the clinic excluding No Patient Has Alerts: Yes cellular tissue based products placed in the center Patient Alerts: Patient on Blood Thinner since last visit: No BP Right Arm-PICC Has Dressing in Place as Prescribed: Yes Pain Present Now: No Electronic Signature(s) Signed: 02/14/2022 12:37:25 PM By: Erenest Blank Entered By: Erenest Blank on 02/14/2022 08:58:31 -------------------------------------------------------------------------------- Encounter Discharge Information Details Patient Name: Date of Service: Christian Brown E. 02/14/2022 9:00 A M Medical Record Number: [id number] Patient Account Number: [id number] Date of Birth/Sex: Treating RN: [birth year] (56 y.o. [name] Primary Care Rakeb Kibble: [name] Other Clinician: Referring Mikya Don: Treating Jenkins Risdon/Extender: [name] in Treatment: 46 Encounter Discharge  Information Items Post Procedure Vitals Discharge Condition: Stable Temperature (F): 97.8 Ambulatory Status: Ambulatory Pulse (bpm): 103 Discharge Destination: Home Respiratory Rate (breaths/min): 20 Transportation: Private Auto Blood Pressure (mmHg): 156/95 Accompanied By: self Schedule Follow-up Appointment: Yes Clinical Summary of Care: Electronic Signature(s) Signed: 02/14/2022 5:25:29 PM By: Deon Pilling RN, BSN Entered By: Deon Pilling on 02/14/2022 09:23:22 Rolly Salter ([id number]) [id number] Page 2 of 7 -------------------------------------------------------------------------------- Lower Extremity Assessment Details Patient Name: Date of Service: [name], [name] 02/14/2022 9:00 A M Medical Record Number: [id number] Patient Account Number: [id number] Date of Birth/Sex: Treating RN: 1965/12/16 (56 y.o. M) Primary Care Adri Schloss: [name] Other Clinician: Referring Jayziah Bankhead: Treating Ena Demary/Extender: [name] in Treatment: 46 Electronic Signature(s) Signed: 02/14/2022 12:37:25 PM By: Erenest Blank Entered By: Erenest Blank on 02/14/2022 09:08:21 -------------------------------------------------------------------------------- Multi Wound Chart Details Patient Name: Date of Service: Christian Brown E. 02/14/2022 9:00 A M Medical Record Number: [id number] Patient Account Number: [id number] Date of Birth/Sex: Treating RN: [birth year] (56 y.o. M) Primary Care Keagen Heinlen: [name] Other Clinician: Referring Caramia Boutin: Treating Alixandra Alfieri/Extender: [name] in Treatment: 46 Vital Signs Height(in): Pulse(bpm): 103 Weight(lbs): Blood Pressure(mmHg): 156/95 Body Mass Index(BMI): Temperature(F): 97.8 Respiratory Rate(breaths/min): 18 [1:Photos:] [N/A:N/A] Midline Back N/A N/A Wound Location: Surgical Injury N/A N/A Wounding Event: Open Surgical Wound N/A N/A Primary Etiology: Hypertension, Type II  Diabetes, N/A N/A Comorbid History: Osteomyelitis 01/11/2021 N/A N/A Date Acquired: 37 N/A N/A Weeks of Treatment: Open N/A N/A Wound Status: No N/A N/A Wound Recurrence: 2.3x0.5x0.4 N/A N/A Measurements L x W x D (cm) 0.903 N/A N/A A (cm) : rea 0.361 N/A N/A Volume (cm) : 98.80% N/A N/A % Reduction in A rea: 99.80% N/A N/A % Reduction in Volume: 12 Position 1 (o'clock): 1.6 Maximum Distance 1 (cm): Yes N/A N/A Tunneling: Full Thickness With Exposed Support N/A N/A Classification: Structures Medium N/A N/A Exudate Amount: Serosanguineous N/A N/A Exudate Type: red, brown N/A N/A Exudate Color: [name],  [name] ([id number]) [id number] Page 3 of 7 Distinct, outline attached N/A N/A Wound Margin: Large (67-100%) N/A N/A Granulation Amount: Red N/A N/A Granulation Quality: Small (1-33%) N/A N/A Necrotic Amount: Fat Layer (Subcutaneous Tissue): Yes N/A N/A Exposed Structures: Fascia: No Tendon: No Muscle: No Joint: No Bone: No Large (67-100%) N/A N/A Epithelialization: Excoriation: No N/A N/A Periwound Skin Texture: Induration: No Callus: No Crepitus: No Rash: No Scarring: No Maceration: No N/A N/A Periwound Skin Moisture: Dry/Scaly: No Atrophie Blanche: No N/A N/A Periwound Skin Color: Cyanosis: No Ecchymosis: No Erythema: No Hemosiderin Staining: No Mottled: No Pallor: No Rubor: No No Abnormality N/A N/A Temperature: Cellular or Tissue Based Product N/A N/A Procedures Performed: Treatment Notes Wound #1 (Back) Wound Laterality: Midline Cleanser Peri-Wound Care Skin Prep Discharge Instruction: Use skin prep as directed Topical Primary Dressing Secondary Dressing ADAPTIC TOUCH 3x4.25 in Discharge Instruction: Applied over primary dressing secured with steri-strips as directed. Woven Gauze Sponge, Non-Sterile 4x4 in Discharge Instruction: Apply over primary dressing as directed. Zetuvit Plus Silicone Border  Dressing 7x7(in/in) Discharge Instruction: Apply silicone border over primary dressing as directed. Secured With Compression Wrap Compression Stockings Environmental education officer) Signed: 02/14/2022 12:29:37 PM By: Linton Ham MD Entered By: Linton Ham on 02/14/2022 67:20:94 -------------------------------------------------------------------------------- Multi-Disciplinary Care Plan Details Patient Name: Date of Service: Christian Brown E. 02/14/2022 9:00 A M Medical Record Number: [id number] Patient Account Number: [id number] Date of Birth/Sex: Treating RN: [birth year] (56 y.o. [name] Primary Care Vendetta Pittinger: [name] Other Clinician: Referring Mercadez Heitman: Treating Laiken Nohr/Extender: Raja, Liska ([id number]) [id number] Page 4 of 7 Weeks in Treatment: [place] reviewed with physician Active Inactive Wound/Skin Impairment Nursing Diagnoses: Impaired tissue integrity Goals: Patient/caregiver will verbalize understanding of skin care regimen Date Initiated: 03/28/2021 Target Resolution Date: 04/11/2022 Goal Status: Active Ulcer/skin breakdown will have a volume reduction of 30% by week 4 Date Initiated: 03/28/2021 Date Inactivated: 05/27/2021 Target Resolution Date: 05/31/2021 Goal Status: Met Interventions: Assess patient/caregiver ability to obtain necessary supplies Assess patient/caregiver ability to perform ulcer/skin care regimen upon admission and as needed Assess ulceration(s) every visit Provide education on ulcer and skin care Treatment Activities: Topical wound management initiated : 03/28/2021 Notes: Electronic Signature(s) Signed: 02/14/2022 5:25:29 PM By: Deon Pilling RN, BSN Entered By: Deon Pilling on 02/14/2022 09:07:44 -------------------------------------------------------------------------------- Pain Assessment Details Patient Name: Date of Service: Christian Brown  E. 02/14/2022 9:00 [facility] Record Number: [id number] Patient Account Number: [id number] Date of Birth/Sex: Treating RN: [birth year] (56 y.o. M) Primary Care Laterria Lasota: [name] Other Clinician: Referring Shahida Schnackenberg: Treating Korion Cuevas/Extender: [name] in Treatment: 46 Active Problems Location of Pain Severity and Description of Pain Patient Has Paino No Site Locations [name], [name] ([id number]) [id number] Page 5 of 7 Pain Management and Medication Current Pain Management: Electronic Signature(s) Signed: 02/14/2022 12:37:25 PM By: Erenest Blank Entered By: Erenest Blank on 02/14/2022 09:08:49 -------------------------------------------------------------------------------- Patient/Caregiver Education Details Patient Name: Date of Service: [name] 11/3/2023andnbsp9:00 A M Medical Record Number: [id number] Patient Account Number: [id number] Date of Birth/Gender: Treating RN: 28-Mar-1966 (56 y.o. [name] Primary Care Physician: [name] Other Clinician: Referring Physician: Treating Physician/Extender: [name] in Treatment: 46 Education Assessment Education Provided To: Patient Education Topics Provided Wound/Skin Impairment: Handouts: Skin Care Do's and Dont's Methods: Explain/Verbal Responses: Reinforcements needed Electronic Signature(s) Signed: 02/14/2022 5:25:29 PM By: Deon Pilling RN, BSN Entered By: Deon Pilling on 02/14/2022 09:07:57 -------------------------------------------------------------------------------- Wound Assessment Details Patient Name: Date  of Service: [name], [name] 02/14/2022 9:00 A M Medical Record Number: [id number] Patient Account Number: [id number] Date of Birth/Sex: Treating RN: 1965/04/29 (56 y.o. M) Primary Care Jalysa Swopes: [name] Other Clinician: Referring Niaomi Cartaya: Treating Kortlynn Poust/Extender: [name] in Treatment: 46 Wound  Status Wound Number: 1 Primary Etiology: Open Surgical Wound Wound Location: Midline Back Wound Status: Open Wounding Event: Surgical Injury Comorbid History: Hypertension, Type II Diabetes, Osteomyelitis Date Acquired: 01/11/2021 Weeks Of Treatment: 46 Clustered Wound: No Photos [name], [name] ([id number]) [id number] Page 6 of 7 Wound Measurements Length: (cm) 2.3 Width: (cm) 0.5 Depth: (cm) 0.4 Area: (cm) 0.903 Volume: (cm) 0.361 % Reduction in Area: 98.8% % Reduction in Volume: 99.8% Epithelialization: Large (67-100%) Tunneling: Yes Position (o'clock): 12 Maximum Distance: (cm) 1.6 Undermining: No Wound Description Classification: Full Thickness With Exposed Support Structures Wound Margin: Distinct, outline attached Exudate Amount: Medium Exudate Type: Serosanguineous Exudate Color: red, brown Foul Odor After Cleansing: No Slough/Fibrino Yes Wound Bed Granulation Amount: Large (67-100%) Exposed Structure Granulation Quality: Red Fascia Exposed: No Necrotic Amount: Small (1-33%) Fat Layer (Subcutaneous Tissue) Exposed: Yes Necrotic Quality: Adherent Slough Tendon Exposed: No Muscle Exposed: No Joint Exposed: No Bone Exposed: No Periwound Skin Texture Texture Color No Abnormalities Noted: No No Abnormalities Noted: No Callus: No Atrophie Blanche: No Crepitus: No Cyanosis: No Excoriation: No Ecchymosis: No Induration: No Erythema: No Rash: No Hemosiderin Staining: No Scarring: No Mottled: No Pallor: No Moisture Rubor: No No Abnormalities Noted: No Dry / Scaly: No Temperature / Pain Maceration: No Temperature: No Abnormality Treatment Notes Wound #1 (Back) Wound Laterality: Midline Cleanser Peri-Wound Care Skin Prep Discharge Instruction: Use skin prep as directed Topical Primary Dressing Secondary Dressing ADAPTIC TOUCH 3x4.25 in Discharge Instruction: Applied over primary dressing secured with steri-strips as  directed. Woven Gauze Sponge, Non-Sterile 4x4 in Discharge Instruction: Apply over primary dressing as directed. Zetuvit Plus Silicone Border Dressing 7x7(in/in) Discharge Instruction: Apply silicone border over primary dressing as directed. [name], [name] ([id number]) [id number] Page 7 of 7 Secured With Compression Wrap Compression Stockings Add-Ons Electronic Signature(s) Signed: 02/14/2022 12:37:25 PM By: Erenest Blank Entered By: Erenest Blank on 02/14/2022 09:11:50 -------------------------------------------------------------------------------- Vitals Details Patient Name: Date of Service: Christian Brown E. 02/14/2022 9:00 A M Medical Record Number: [id number] Patient Account Number: [id number] Date of Birth/Sex: Treating RN: 09-27-1965 (56 y.o. M) Primary Care Jimi Giza: [name] Other Clinician: Referring Maebell Lyvers: Treating Jarris Kortz/Extender: [name] in Treatment: 46 Vital Signs Time Taken: 09:00 Temperature (F): 97.8 Pulse (bpm): 103 Respiratory Rate (breaths/min): 18 Blood Pressure (mmHg): 156/95 Reference Range: 80 - 120 mg / dl Electronic Signature(s) Signed: 02/14/2022 12:37:25 PM By: Erenest Blank Entered By: Erenest Blank on 02/14/2022 09:00:29

## 2022-02-17 ENCOUNTER — Other Ambulatory Visit (HOSPITAL_COMMUNITY): Payer: Self-pay

## 2022-02-21 ENCOUNTER — Other Ambulatory Visit (HOSPITAL_COMMUNITY): Payer: Self-pay

## 2022-02-21 ENCOUNTER — Encounter (HOSPITAL_BASED_OUTPATIENT_CLINIC_OR_DEPARTMENT_OTHER): Payer: No Typology Code available for payment source | Admitting: Internal Medicine

## 2022-02-21 ENCOUNTER — Other Ambulatory Visit: Payer: Self-pay | Admitting: Internal Medicine

## 2022-02-21 ENCOUNTER — Other Ambulatory Visit: Payer: Self-pay | Admitting: Cardiology

## 2022-02-21 DIAGNOSIS — I2510 Atherosclerotic heart disease of native coronary artery without angina pectoris: Secondary | ICD-10-CM

## 2022-02-21 DIAGNOSIS — L98424 Non-pressure chronic ulcer of back with necrosis of bone: Secondary | ICD-10-CM

## 2022-02-21 DIAGNOSIS — E11622 Type 2 diabetes mellitus with other skin ulcer: Secondary | ICD-10-CM | POA: Diagnosis not present

## 2022-02-21 MED ORDER — ROSUVASTATIN CALCIUM 20 MG PO TABS
20.0000 mg | ORAL_TABLET | Freq: Every day | ORAL | 0 refills | Status: DC
  Filled 2022-02-21: qty 90, 90d supply, fill #0

## 2022-02-21 MED ORDER — GABAPENTIN 600 MG PO TABS
600.0000 mg | ORAL_TABLET | Freq: Three times a day (TID) | ORAL | 1 refills | Status: DC
  Filled 2022-02-21 (×2): qty 270, 90d supply, fill #0
  Filled 2022-05-25: qty 270, 90d supply, fill #1

## 2022-02-21 NOTE — Progress Notes (Signed)
[name] ([id number]) [id number] Page 1 of 10 Visit Report for 02/21/2022 Chief Complaint Document Details Patient Name: Date of Service: [name], [name] 02/21/2022 9:15 A M Medical Record Number: [id number] Patient Account Number: [id number] Date of Birth/Sex: Treating RN: 1965-11-07 (56 y.o. M) Primary Care Provider: [name] Other Clinician: Referring Provider: Treating Provider/Extender: [name] Weeks in Treatment: 55 Information Obtained from: Patient Chief Complaint 03/28/2021; Back wound Electronic Signature(s) Signed: 02/21/2022 11:34:24 AM By: Kalman Shan DO Entered By: Kalman Shan on 02/21/2022 10:28:14 -------------------------------------------------------------------------------- Cellular or Tissue Based Product Details Patient Name: Date of Service: [name], [name] 02/21/2022 9:15 A M Medical Record Number: [id number] Patient Account Number: [id number] Date of Birth/Sex: Treating RN: 22-May-1965 (56 y.o. [name] Primary Care Provider: [name] Other Clinician: Referring Provider: Treating Provider/Extender: [name] in Treatment: 47 Cellular or Tissue Based Product Type Wound #1 Midline Back Applied to: Performed By: Physician Kalman Shan, DO Cellular or Tissue Based Product Type: Epicord Level of Consciousness (Pre-procedure): Awake and Alert Pre-procedure Verification/Time Out Yes - 10:13 Taken: Location: trunk / arms / legs Wound Size (sq cm): 1.2 Product Size (sq cm): 6 Waste Size (sq cm): 0 Amount of Product Applied (sq cm): 6 Instrument Used: Forceps, Scissors Lot #: [id number] Order #: 15 Expiration Date: 10/13/2026 Fenestrated: No Reconstituted: Yes Solution Type: normal saline Solution Amount: 73m Lot #: [id number] Expiration Date: 08/11/2024 Secured: Yes Secured With: Steri-Strips, adaptic Dressing Applied: No Procedural Pain:  0 Post Procedural Pain: 0 Response to Treatment: Procedure was tolerated well Level of Consciousness (Post- Awake and Alert procedure): [name], [name]([id number] Page 2 of 10 Post Procedure Diagnosis Same as Pre-procedure Electronic Signature(s) Signed: 02/21/2022 11:34:24 AM By: HKalman ShanDO Signed: 02/21/2022 2:17:30 PM By: DDeon PillingRN, BSN Entered By: DDeon Pillingon 02/21/2022 10:15:22 -------------------------------------------------------------------------------- Debridement Details Patient Name: Date of Service: [name] 02/21/2022 9:15 A M Medical Record Number: [id number] Account Number: [id number] of Birth/Sex: Treating RN: 312-27-1967(56y.o. MHessie DienerPrimary Care Provider: PKathlene NovemberOther Clinician: Referring Provider: Treating Provider/Extender: HFreddi StarrWeeks in Treatment: 47 Debridement Performed for Assessment: Wound #1 Midline Back Performed By: Physician HKalman Shan DO Debridement Type: Debridement Level of Consciousness (Pre-procedure): Awake and Alert Pre-procedure Verification/Time Out Yes - 10:05 Taken: Start Time: 10:06 Pain Control: Lidocaine 4% T opical Solution T Area Debrided (L x W): otal 3 (cm) x 0.4 (cm) = 1.2 (cm) Tissue and other material debrided: Viable, Non-Viable, Slough, Subcutaneous, Slough Level: Skin/Subcutaneous Tissue Debridement Description: Excisional Instrument: Curette Bleeding: Minimum Hemostasis Achieved: Pressure End Time: 10:12 Procedural Pain: 0 Post Procedural Pain: 0 Response to Treatment: Procedure was tolerated well Level of Consciousness (Post- Awake and Alert procedure): Post Debridement Measurements of Total Wound Length: (cm) 3 Width: (cm) 1.4 Depth: (cm) 0.4 Volume: (cm) 1.319 Character of Wound/Ulcer Post Debridement: Improved Post Procedure Diagnosis Same as Pre-procedure Notes documented by BDeon Pilling RN for Dr. HHeber Patterson Electronic Signature(s) Signed: 02/21/2022 11:34:24 AM By: HKalman ShanDO Signed: 02/21/2022 2:17:30 PM By: DDeon PillingRN, BSN Entered By: DDeon Pillingon 02/21/2022 10:13:11 HPI Details -------------------------------------------------------------------------------- [name]([id number] Page 3 of 10 Patient Name: Date of Service: [name], BORD11/01/2022 9:15 A M Medical Record Number: [id number] Account Number: [id number] of Birth/Sex: Treating RN: 3Dec 10, 1967(56y.o. M) Primary Care Provider: PKathlene NovemberOther Clinician: Referring Provider: Treating Provider/Extender: HKalman Shan  [name] Weeks in Treatment: 47 History of Present Illness HPI Description: Admission 03/28/2021 [name] is a 56 year old male with a past medical history of controlled type 2 diabetes on oral agents, obesity and OSA that presents to the clinic for a back wound. On 01/11/2021 patient had a laminectomy with PLIF of L1-S1 by [name] because of lumbar stenosis and radiculopathy. He subsequently developed bacteremia. He had CT imaging on 10/13 of the lumbar spine that showed fluid collection in the soft tissue of the posterior L1 and S1 and was taken to the OR for washout on 10/14. 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 He received 4 weeks of IV antibiotics by infectious disease. After his completion of 4 weeks of IV antibiotics he was continued for an additional 4 weeks of IV cefazolin with a stop date of 12/29. He has been evaluated by plastic surgery and no plans for surgical intervention at this time. Wife is present and reports he has been on the wound VAC for the past 8 weeks with improvement in wound healing. He currently denies systemic signs of infection. 12/22; patient presents for follow-up. He reports no issues since last clinic visit. He denies signs of  infection. He has been tolerating the wound VAC well. 12/30; patient presents for follow-up. He reports no issues and has no complaints today. He has been tolerating the wound VAC well. 1/9; patient presents for follow-up. He has no issues or complaints today. He states he feels well. He has had no problems with the wound VAC. 1/16; patient presents for follow-up. He continues to use the wound VAC with no issues. He denies signs of infection. 1/23; patient presents for follow-up. He has been switched from IV cefazolin to oral cefadroxil by infectious disease. He has no issues or complaints today. He denies signs of infection. He continues to tolerate the wound VAC well. 1/30; patient presents for follow-up. He continues to tolerate the wound VAC well. 2/6; patient presents for follow-up. He has no issues or complaints today. He continues to tolerate the wound VAC well. He denies signs of infection. 2/13; patient presents for follow-up. He continues to do well with the wound VAC. He denies any issues. 2/27; patient presents for follow-up. He continues to use the wound VAC without any issues. He denies signs of infection. 3/20; patient presents for follow-up. He has no issues or complaints today. He continues to use the wound VAC. 4/3; patient presents for follow-up. He continues to use the wound VAC without issues. He denies signs of infection. 4/17; 2-week follow-up. He continues to do well. His measurements are improved. Initially a surgical wound complicated by infection. 5/1; patient presents for follow-up. He has no issues or complaints today. He continues to tolerate the wound VAC well. He denies signs of infection. 5/15; patient presents for follow-up. He has noted more maceration to the periwound. He has been using the wound VAC without issues. He currently denies signs of infection. 5/30; patient presents for follow-up. He has been tolerating the wound VAC well over the past 2 weeks. He no  longer has maceration to the periwound. He has no issues or complaints today. 6/8; this patient with a postsurgical wound that was complicated by postop infection. He has been using silver collagen under wound VAC and gradually doing well improvement in dimensions especially the tunnel at 12:00 6/22; patient presents for follow-up. We have been using silver collagen under the wound VAC. Patient has  no issues or questions today. 7/6; patient presents for follow-up. Patient continues to use collagen under the wound VAC with no issues. 7/20; patient presents for follow-up. He has been using collagen under the wound VAC with no issues. He has been approved for Epicord. This was discussed with the patient and he is in agreement to having this placed at next clinic visit. 7/27; patient presents for follow-up. He has been tolerating the wound VAC well with collagen underneath. He has been approved for epi cord and we have this to place in office today. 8/4; patient presents for follow-up. He had Epicort placed at last clinic visit. We held off on the wound VAC. He tolerated this well. He has no issues or complaints today. 8/11; patient presents for follow-up. Epicord was placed at last clinic visit. He had the wound VAC on for the past week. He states that no drainage was suctioned into the canister. He has slight maceration to the periwound. 8/18; patient presents for follow-up. Epicord #3 was placed in standard fashion last week. The wound VAC was held. He has no issues or complaints today. 8/25; Patient presents for follow-up. Epicord #4 was placed in standard fashion last week. The wound VAC was held again. He has no issues or complaints today. There has been improvement in wound healing. 9/1; patient presents for follow-up. Epicort #5 was placed in standard fashion last week. He has no issues or complaints today. 9/8; patient presents for follow-up. Epicord #6 was placed in standard fashion last  week. He tolerated this well and has no issues or complaints today. 9/15; Epicort No. 7 the wound looks healthy. Perhaps some hyper granulated tissue in the lower 50% of the longitudinal wound 9/22; Epicord #8 placed at last clinic visit. Wound appears well-healing. Patient has no issues or complaints today. 9/28. Epicord No. 9. Continued improvement. 10/6; patient has completed the allotted epicord Applications that insurance would cover. Insurance denied extending more applications. Patient has no issues or complaints today. He tolerated the last application well. [name], [name] ([id number]) [id number] Page 4 of 10 10/13; patient presents for follow-up. We have been using endoform to the wound bed over the past week. He has been approved for 5 more applications of Epicord. 10/20; patient presents for follow-up. Epicort was placed at last clinic visit. He has no issues or complaints today. He denies signs of infection. 10/27; patient presents for follow-up. Epicort was placed in standard fashion at last clinic visit. He has no issues or complaints today. He denies signs of infection. 11/3; Epicort No. 13. Tunneling depth at 12:00 is 2.5 cm as measured by myself 11/10; last patient's approved Epicort #15 was placed in standard fashion today. Tunneling is stable however has shown improvement in healing over the past several weeks. He has no issues or complaints today. Electronic Signature(s) Signed: 02/21/2022 11:34:24 AM By: Kalman Shan DO Entered By: Kalman Shan on 02/21/2022 10:30:02 -------------------------------------------------------------------------------- Physical Exam Details Patient Name: Date of Service: Christian Brown E. 02/21/2022 9:15 A M Medical Record Number: [id number] Patient Account Number: [id number] Date of Birth/Sex: Treating RN: 1965-10-07 (56 y.o. M) Primary Care Provider: [name] Other Clinician: Referring Provider: Treating  Provider/Extender: [name] Weeks in Treatment: 31 Constitutional respirations regular, non-labored and within target range for patient.. Cardiovascular 2+ dorsalis pedis/posterior tibialis pulses. Psychiatric pleasant and cooperative. Notes Wound exam; lumbar spine surgical incision site. Tunnel at the 12 o'clock position About 2.4 cm. He has granulation tissue With minimal slough in  all wound areas no signs of infection. Electronic Signature(s) Signed: 02/21/2022 11:34:24 AM By: Kalman Shan DO Entered By: Kalman Shan on 02/21/2022 10:36:49 -------------------------------------------------------------------------------- Physician Orders Details Patient Name: Date of Service: Christian Brown E. 02/21/2022 9:15 A M Medical Record Number: [id number] Patient Account Number: [id number] Date of Birth/Sex: Treating RN: [birth year] (56 y.o. 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 - Friday Dr. Heber Delphos Return A ppointment in 2 weeks. - Friday Dr. Heber Corning Return A Anesthetic (In clinic) Topical Lidocaine 5% applied to wound bed Cellular or Tissue Based Products Cellular or Tissue Based Product Type: - Epicord #3 applied 11/22/21 Epicord #4 11/29/21 Epicord #5 12/06/2021 Epicord #6 12/13/21 Epicord # 7 12/20/21 Epicord # 8 12/27/21 Epicord # 9 01/03/22 Epicord # 10 01/09/22 Epicord # 11 01/24/22 Epicord # 12 01/31/22 Epicord #13 02/07/2022 Epicord #14 02/14/2022 Epicord #15  02/21/2022 daptic or Mepitel. (DO NOT REMOVE). - leave Cellular or Tissue Based Product applied to wound bed, secured with steri-strips, cover with A the adaptic and steri-strips in place. Bathing/ Shower/ Hygiene May shower with protection but do not get wound dressing(s) wet. Additional Orders / Instructions Follow Nutritious Diet - Continue to monitor blood sugars daily Wound Treatment Wound #1 - Back Wound Laterality: Midline Peri-Wound Care: Skin Prep Every Other Day/30 Days Discharge Instructions: Use skin prep as directed Secondary Dressing: ADAPTIC TOUCH 3x4.25 in Every Other Day/30 Days Discharge Instructions: Applied over primary dressing secured with steri-strips as directed. Secondary Dressing: Woven Gauze Sponge, Non-Sterile 4x4 in Every Other Day/30 Days Discharge Instructions: Apply over primary dressing as directed. Secondary Dressing: Zetuvit Plus Silicone Border Dressing 7x7(in/in) (Generic) Every Other Day/30 Days Discharge Instructions: Apply silicone border over primary dressing as directed. Electronic Signature(s) Signed: 02/21/2022 11:34:24 AM By: Kalman Shan DO Entered By: Kalman Shan on 02/21/2022 10:37:01 -------------------------------------------------------------------------------- Problem List Details Patient Name: Date of Service: Christian Brown E. 02/21/2022 9:15 A M Medical Record Number: [id number] Patient Account Number: [id number] Date of Birth/Sex: Treating RN: [birth year] (56 y.o. 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 03/28/2021 No Yes [name], [name] ([id number]) [id number] Page 6 of 10 M86.9 Osteomyelitis, unspecified 03/28/2021 No Yes E11.622  Type 2 diabetes mellitus with other skin ulcer 03/28/2021 No Yes Inactive Problems Resolved Problems Electronic Signature(s) Signed: 02/21/2022 11:34:24 AM By: Kalman Shan DO Previous Signature: 02/21/2022 10:08:27 AM Version By: Kalman Shan DO Entered By: Kalman Shan on 02/21/2022 10:27:57 -------------------------------------------------------------------------------- Progress Note Details Patient Name: Date of Service: Christian Grout. 02/21/2022 9:15 A M Medical Record Number: [id number] Patient Account Number: [id number] Date of Birth/Sex: Treating RN: [birth year] (56 y.o. M) Primary Care Provider: [name] Other Clinician: Referring Provider: Treating Provider/Extender: [name] Weeks in Treatment: 32 Subjective Chief Complaint Information obtained from Patient 03/28/2021; Back wound History of Present Illness (HPI) Admission 03/28/2021 Mr. Lucy Woolever is a 56 year old male with a past medical history of controlled type 2 diabetes on oral  agents, obesity and OSA that presents to the clinic for a back wound. On 01/11/2021 patient had a laminectomy with PLIF of L1-S1 by [name] because of lumbar stenosis and radiculopathy. He subsequently developed bacteremia. He had CT imaging on 10/13 of the lumbar spine that showed fluid collection in the soft tissue of the posterior L1 and S1 and was taken to the OR for washout on 10/14. 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 He received 4 weeks of IV antibiotics by infectious disease. After his completion of 4 weeks of IV antibiotics he was continued for an additional 4 weeks of IV cefazolin with a stop date of 12/29. He has been evaluated by plastic surgery and no plans for surgical intervention at this time. Wife is present and reports he has been on the wound VAC for the past 8 weeks with improvement in wound healing. He currently denies systemic signs of infection. 12/22;  patient presents for follow-up. He reports no issues since last clinic visit. He denies signs of infection. He has been tolerating the wound VAC well. 12/30; patient presents for follow-up. He reports no issues and has no complaints today. He has been tolerating the wound VAC well. 1/9; patient presents for follow-up. He has no issues or complaints today. He states he feels well. He has had no problems with the wound VAC. 1/16; patient presents for follow-up. He continues to use the wound VAC with no issues. He denies signs of infection. 1/23; patient presents for follow-up. He has been switched from IV cefazolin to oral cefadroxil by infectious disease. He has no issues or complaints today. He denies signs of infection. He continues to tolerate the wound VAC well. 1/30; patient presents for follow-up. He continues to tolerate the wound VAC well. 2/6; patient presents for follow-up. He has no issues or complaints today. He continues to tolerate the wound VAC well. He denies signs of infection. 2/13; patient presents for follow-up. He continues to do well with the wound VAC. He denies any issues. 2/27; patient presents for follow-up. He continues to use the wound VAC without any issues. He denies signs of infection. 3/20; patient presents for follow-up. He has no issues or complaints today. He continues to use the wound VAC. 4/3; patient presents for follow-up. He continues to use the wound VAC without issues. He denies signs of infection. 4/17; 2-week follow-up. He continues to do well. His measurements are improved. Initially a surgical wound complicated by infection. 5/1; patient presents for follow-up. He has no issues or complaints today. He continues to tolerate the wound VAC well. He denies signs of infection. [name], [name] ([id number]) [id number] Page 7 of 10 5/15; patient presents for follow-up. He has noted more maceration to the periwound. He has been using the  wound VAC without issues. He currently denies signs of infection. 5/30; patient presents for follow-up. He has been tolerating the wound VAC well over the past 2 weeks. He no longer has maceration to the periwound. He has no issues or complaints today. 6/8; this patient with a postsurgical wound that was complicated by postop infection. He has been using silver collagen under wound VAC and gradually doing well improvement in dimensions especially the tunnel at 12:00 6/22; patient presents for follow-up. We have been using silver collagen under the wound VAC. Patient has no issues or questions today. 7/6; patient presents for follow-up. Patient continues to use collagen under the wound VAC with no issues. 7/20; patient  presents for follow-up. He has been using collagen under the wound VAC with no issues. He has been approved for Epicord. This was discussed with the patient and he is in agreement to having this placed at next clinic visit. 7/27; patient presents for follow-up. He has been tolerating the wound VAC well with collagen underneath. He has been approved for epi cord and we have this to place in office today. 8/4; patient presents for follow-up. He had Epicort placed at last clinic visit. We held off on the wound VAC. He tolerated this well. He has no issues or complaints today. 8/11; patient presents for follow-up. Epicord was placed at last clinic visit. He had the wound VAC on for the past week. He states that no drainage was suctioned into the canister. He has slight maceration to the periwound. 8/18; patient presents for follow-up. Epicord #3 was placed in standard fashion last week. The wound VAC was held. He has no issues or complaints today. 8/25; Patient presents for follow-up. Epicord #4 was placed in standard fashion last week. The wound VAC was held again. He has no issues or complaints today. There has been improvement in wound healing. 9/1; patient presents for follow-up.  Epicort #5 was placed in standard fashion last week. He has no issues or complaints today. 9/8; patient presents for follow-up. Epicord #6 was placed in standard fashion last week. He tolerated this well and has no issues or complaints today. 9/15; Epicort No. 7 the wound looks healthy. Perhaps some hyper granulated tissue in the lower 50% of the longitudinal wound 9/22; Epicord #8 placed at last clinic visit. Wound appears well-healing. Patient has no issues or complaints today. 9/28. Epicord No. 9. Continued improvement. 10/6; patient has completed the allotted epicord Applications that insurance would cover. Insurance denied extending more applications. Patient has no issues or complaints today. He tolerated the last application well. 10/13; patient presents for follow-up. We have been using endoform to the wound bed over the past week. He has been approved for 5 more applications of Epicord. 10/20; patient presents for follow-up. Epicort was placed at last clinic visit. He has no issues or complaints today. He denies signs of infection. 10/27; patient presents for follow-up. Epicort was placed in standard fashion at last clinic visit. He has no issues or complaints today. He denies signs of infection. 11/3; Epicort No. 13. Tunneling depth at 12:00 is 2.5 cm as measured by myself 11/10; last patient's approved Epicort #15 was placed in standard fashion today. Tunneling is stable however has shown improvement in healing over the past several weeks. He has no issues or complaints today. Patient History Information obtained from Patient. Family History Diabetes - Mother, Stroke - Siblings, No family history of Cancer, Heart Disease, Hereditary Spherocytosis, Hypertension, Kidney Disease, Lung Disease, Seizures, Thyroid Problems, Tuberculosis. Social History Never smoker, Marital Status - Married, Alcohol Use - Rarely, Drug Use - No History, Caffeine Use - Rarely. Medical  History Cardiovascular Patient has history of Hypertension Endocrine Patient has history of Type II Diabetes Musculoskeletal Patient has history of Osteomyelitis Medical A Surgical History Notes nd Musculoskeletal DDD Objective [name], [name] ([id number]) [id number] Page 8 of 10 Constitutional respirations regular, non-labored and within target range for patient.. Vitals Time Taken: 9:32 AM, Temperature: 98.7 F, Pulse: 80 bpm, Respiratory Rate: 17 breaths/min, Blood Pressure: 132/71 mmHg. Cardiovascular 2+ dorsalis pedis/posterior tibialis pulses. Psychiatric pleasant and cooperative. General Notes: Wound exam; lumbar spine surgical incision site. Tunnel at the 12 o'clock position About  2.4 cm. He has granulation tissue With minimal slough in all wound areas no signs of infection. Integumentary (Hair, Skin) Wound #1 status is Open. Original cause of wound was Surgical Injury. The date acquired was: 01/11/2021. The wound has been in treatment 47 weeks. The wound is located on the Midline Back. The wound measures 3cm length x 0.4cm width x 0.4cm depth; 0.942cm^2 area and 0.377cm^3 volume. There is Fat Layer (Subcutaneous Tissue) exposed. There is no undermining noted, however, there is tunneling at 12:00 with a maximum distance of 2.4cm. There is a medium amount of serosanguineous drainage noted. The wound margin is distinct with the outline attached to the wound base. There is large (67-100%) red granulation within the wound bed. There is a small (1-33%) amount of necrotic tissue within the wound bed including Adherent Slough. The periwound skin appearance did not exhibit: Callus, Crepitus, Excoriation, Induration, Rash, Scarring, Dry/Scaly, Maceration, Atrophie Blanche, Cyanosis, Ecchymosis, Hemosiderin Staining, Mottled, Pallor, Rubor, Erythema. Periwound temperature was noted as No Abnormality. 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 The tunnel is filling in. I debrided nonviable tissue. Epicord #15 was placed in standard fashion today. He is aware that this is his last skin substitute of the series. Follow-up in 1 week. Procedures Wound #1 Pre-procedure diagnosis of Wound #1 is an Open Surgical Wound located on the Midline Back . There was a Excisional Skin/Subcutaneous Tissue Debridement with a total area of 1.2 sq cm performed by Kalman Shan, DO. With the following instrument(s): Curette to remove Viable and Non-Viable tissue/material. Material removed includes Subcutaneous Tissue and Slough and after achieving pain control using Lidocaine 4% T opical Solution. A time out was conducted at 10:05, prior to the start of the procedure. A Minimum amount of bleeding was controlled with Pressure. The procedure was tolerated well with a pain level of 0 throughout and a pain level of 0 following the procedure. Post Debridement Measurements: 3cm length x 1.4cm width x 0.4cm depth; 1.319cm^3 volume. Character of Wound/Ulcer Post Debridement is improved. Post procedure Diagnosis Wound #1: Same as Pre-Procedure General Notes: documented by Deon Pilling, RN for Dr. Heber [place].. Pre-procedure diagnosis of Wound #1 is an Open Surgical Wound located on the Midline Back. A skin graft procedure using a bioengineered skin substitute/cellular or tissue based product was performed by Kalman Shan, DO with the following instrument(s): Forceps and Scissors. Epicord was applied and secured with Steri-Strips and adaptic. 6 sq cm of product was utilized and 0 sq cm was wasted. Post Application, no dressing was applied. A Time Out was conducted at 10:13, prior to the start of the procedure. The procedure was tolerated well with a pain level of 0 throughout and a pain level of 0 following the  procedure. Post procedure Diagnosis Wound #1: Same as Pre-Procedure . Plan Follow-up Appointments: Return Appointment in 1 week. - Friday Dr. Heber [place] Return Appointment in 2 weeks. - Friday Dr. Heber [place] Anesthetic: (In clinic) Topical Lidocaine 5% applied to wound bed Cellular or Tissue Based Products: Cellular or Tissue Based Product Type: - Epicord #3 applied 11/22/21 Epicord #4 11/29/21 Epicord #5 12/06/2021 Epicord #6 12/13/21 Epicord # 7 12/20/21 Epicord # 8 12/27/21 Epicord # 9 01/03/22 Epicord # 10 01/09/22 Epicord # 11 01/24/22 Epicord # 12 01/31/22 Epicord #13 02/07/2022 Epicord #14 02/14/2022 Epicord #15 02/21/2022 Cellular or Tissue Based Product applied to wound bed, secured with steri-strips,  cover with Adaptic or Mepitel. (DO NOT REMOVE). - leave the adaptic and steri-strips in place. Bathing/ Shower/ Hygiene: May shower with protection but do not get wound dressing(s) wet. Additional Orders / Instructions: Follow Nutritious Diet - Continue to monitor blood sugars daily WOUND #1: - Back Wound Laterality: Midline [name], [name] ([id number]) [id number] Page 9 of 10 Peri-Wound Care: Skin Prep Every Other Day/30 Days Discharge Instructions: Use skin prep as directed Secondary Dressing: ADAPTIC TOUCH 3x4.25 in Every Other Day/30 Days Discharge Instructions: Applied over primary dressing secured with steri-strips as directed. Secondary Dressing: Woven Gauze Sponge, Non-Sterile 4x4 in Every Other Day/30 Days Discharge Instructions: Apply over primary dressing as directed. Secondary Dressing: Zetuvit Plus Silicone Border Dressing 7x7(in/in) (Generic) Every Other Day/30 Days Discharge Instructions: Apply silicone border over primary dressing as directed. 1. In office sharp debridement 2. Epicort #15 placed in standard fashion 3. Follow-up in 1 week Electronic Signature(s) Signed: 02/21/2022 11:34:24 AM By: Kalman Shan DO Entered By: Kalman Shan  on 02/21/2022 10:37:11 -------------------------------------------------------------------------------- HxROS Details Patient Name: Date of Service: Christian Brown E. 02/21/2022 9:15 A M Medical Record Number: [id number] Patient Account Number: [id number] Date of Birth/Sex: Treating RN: [birth year] (56 y.o. M) Primary Care Provider: [name] Other Clinician: Referring Provider: Treating Provider/Extender: [name] Weeks in Treatment: 11 Information Obtained From Patient Cardiovascular Medical History: Positive for: Hypertension Endocrine Medical History: Positive for: Type II Diabetes Time with diabetes: Since mid 90's Treated with: Oral agents Blood sugar tested every day: Yes Tested : 2x day Musculoskeletal Medical History: Positive for: Osteomyelitis Past Medical History Notes: DDD Immunizations Pneumococcal Vaccine: Received Pneumococcal Vaccination: Yes Received Pneumococcal Vaccination On or After 60th Birthday: No Implantable Devices Yes Family and Social History Cancer: No; Diabetes: Yes - Mother; Heart Disease: No; Hereditary Spherocytosis: No; Hypertension: No; Kidney Disease: No; Lung Disease: No; Seizures: No; Stroke: Yes - Siblings; Thyroid Problems: No; Tuberculosis: No; Never smoker; Marital Status - Married; Alcohol Use: Rarely; Drug Use: No History; Caffeine Use: Rarely; Financial Concerns: No; Food, Clothing or Shelter Needs: No; Support System Lacking: No; Transportation Concerns: No Electronic Signature(s) Signed: 02/21/2022 11:34:24 AM By: Smitty Pluck ([id number]) [id number] Page 10 of 10 Entered By: Kalman Shan on 02/21/2022 10:30:08 -------------------------------------------------------------------------------- SuperBill Details Patient Name: Date of Service: JEHAD, BISONO 02/21/2022 Medical Record Number: [id number] Patient Account Number: [id number] Date of Birth/Sex:  Treating RN: Apr 10, 1966 (56 y.o. [name] Primary Care Provider: [name] Other Clinician: Referring Provider: Treating Provider/Extender: [name] Weeks in Treatment: 47 Diagnosis Coding ICD-10 Codes Code Description [phone] Non-pressure chronic ulcer of back with necrosis of bone M86.9 Osteomyelitis, unspecified E11.622 Type 2 diabetes mellitus with other skin ulcer Facility Procedures : CPT4 Code: 78242353 Description: I1443 Epicord 2cm x 3cm - per sqcm Modifier: Quantity: 6 : CPT4 Code: [id number] Description: 61950 - SKIN SUB GRAFT TRNK/ARM/LEG ICD-10 Diagnosis Description L98.424 Non-pressure chronic ulcer of back with necrosis of bone Modifier: Quantity: 1 Physician Procedures : CPT4 Code Description Modifier [id number] - WC PHYS SKIN SUB GRAFT TRNK/ARM/LEG ICD-10 Diagnosis Description L98.424 Non-pressure chronic ulcer of back with necrosis of bone Quantity: 1 Electronic Signature(s) Signed: 02/21/2022 11:34:24 AM By: Kalman Shan DO Entered By: Kalman Shan on 02/21/2022 10:37:48

## 2022-02-24 ENCOUNTER — Other Ambulatory Visit (HOSPITAL_COMMUNITY): Payer: Self-pay

## 2022-02-24 NOTE — Progress Notes (Signed)
Murphy, Christian ([id number]) [id number] Page 1 of 7 Visit Report for 02/21/2022 Arrival Information Details Patient Name: Date of Service: [name], [name] 02/21/2022 9:15 A M Medical Record Number: [id number] Patient Account Number: [id number] Date of Birth/Sex: Treating RN: [birth year] (56 y.o. [name], [name] Primary Care Riordan Walle: [name] Other Clinician: Referring Zakayla Martinec: Treating Lynsi Dooner/Extender: [name] in Treatment: 70 Visit Information History Since Last Visit Added or deleted any medications: No Patient Arrived: Ambulatory Any new allergies or adverse reactions: No Arrival Time: 09:32 Had a fall or experienced change in No Accompanied By: self activities of daily living that may affect Transfer Assistance: None risk of falls: Patient Identification Verified: Yes Signs or symptoms of abuse/neglect since last visito No Secondary Verification Process Completed: Yes Hospitalized since last visit: No Patient Requires Transmission-Based Precautions: No Implantable device outside of the clinic excluding No Patient Has Alerts: Yes cellular tissue based products placed in the center Patient Alerts: Patient on Blood Thinner since last visit: No BP Right Arm-PICC Has Dressing in Place as Prescribed: Yes Pain Present Now: No Electronic Signature(s) Signed: 02/24/2022 4:10:29 PM By: [name] RN Entered By: [name] on 02/21/2022 09:32:58 -------------------------------------------------------------------------------- Encounter Discharge Information Details Patient Name: Date of Service: Christian Brown E. 02/21/2022 9:15 A M Medical Record Number: [id number] Patient Account Number: [id number] Date of Birth/Sex: Treating RN: June 18, 1965 (56 y.o. [name] Primary Care Baldomero Mirarchi: [name] Other Clinician: Referring Aliyha Fornes: Treating Melbert Botelho/Extender: [name] in Treatment:  32 Encounter Discharge Information Items Post Procedure Vitals Discharge Condition: Stable Temperature (F): 98.7 Ambulatory Status: Ambulatory Pulse (bpm): 80 Discharge Destination: Home Respiratory Rate (breaths/min): 20 Transportation: Private Auto Blood Pressure (mmHg): 132/71 Accompanied By: self Schedule Follow-up Appointment: Yes Clinical Summary of Care: Electronic Signature(s) Signed: 02/21/2022 2:17:30 PM By: Deon Pilling RN, BSN Entered By: Deon Pilling on 02/21/2022 10:16:42 Rolly Salter ([id number]) [id number] Page 2 of 7 -------------------------------------------------------------------------------- Lower Extremity Assessment Details Patient Name: Date of Service: Christian, Murphy 02/21/2022 9:15 A M Medical Record Number: [id number] Patient Account Number: [id number] Date of Birth/Sex: Treating RN: Jul 15, 1965 (56 y.o. [name] Primary Care Tyrelle Raczka: [name] Other Clinician: Referring Rajah Tagliaferro: Treating Aliyanah Rozas/Extender: [name] Weeks in Treatment: 24 Electronic Signature(s) Signed: 02/24/2022 4:10:29 PM By: [name] RN Entered By: [name] on 02/21/2022 09:33:25 -------------------------------------------------------------------------------- Multi Wound Chart Details Patient Name: Date of Service: Christian Brown E. 02/21/2022 9:15 A M Medical Record Number: [id number] Patient Account Number: [id number] Date of Birth/Sex: Treating RN: 08/25/1965 (56 y.o. M) Primary Care Cletus Paris: [name] Other Clinician: Referring Tyleigh Mahn: Treating Reeva Davern/Extender: [name] Weeks in Treatment: 43 Vital Signs Height(in): Pulse(bpm): 80 Weight(lbs): Blood Pressure(mmHg): 132/71 Body Mass Index(BMI): Temperature(F): 98.7 Respiratory Rate(breaths/min): 17 [1:Photos:] [N/A:N/A] Midline Back N/A N/A Wound Location: Surgical Injury N/A N/A Wounding Event: Open Surgical  Wound N/A N/A Primary Etiology: Hypertension, Type II Diabetes, N/A N/A Comorbid History: Osteomyelitis 01/11/2021 N/A N/A Date Acquired: 45 N/A N/A Weeks of Treatment: Open N/A N/A Wound Status: No N/A N/A Wound Recurrence: 3x0.4x0.4 N/A N/A Measurements L x W x D (cm) 0.942 N/A N/A A (cm) : rea 0.377 N/A N/A Volume (cm) : 98.80% N/A N/A % Reduction in A rea: 99.80% N/A N/A % Reduction in Volume: 12 Position 1 (o'clock): 2.4 Maximum Distance 1 (cm): Yes N/A N/A Tunneling: Full Thickness With Exposed Support N/A N/A Classification: Structures Medium N/A N/A Exudate Amount: Serosanguineous N/A N/A Exudate Type: red,  brown N/A N/A Exudate Color: KHRISTOPHER, KAPAUN ([id number]) [id number] Page 3 of 7 Distinct, outline attached N/A N/A Wound Margin: Large (67-100%) N/A N/A Granulation Amount: Red N/A N/A Granulation Quality: Small (1-33%) N/A N/A Necrotic Amount: Fat Layer (Subcutaneous Tissue): Yes N/A N/A Exposed Structures: Fascia: No Tendon: No Muscle: No Joint: No Bone: No Large (67-100%) N/A N/A Epithelialization: Debridement - Excisional N/A N/A Debridement: Pre-procedure Verification/Time Out 10:05 N/A N/A Taken: Lidocaine 4% Topical Solution N/A N/A Pain Control: Subcutaneous, Slough N/A N/A Tissue Debrided: Skin/Subcutaneous Tissue N/A N/A Level: 1.2 N/A N/A Debridement A (sq cm): rea Curette N/A N/A Instrument: Minimum N/A N/A Bleeding: Pressure N/A N/A Hemostasis A chieved: 0 N/A N/A Procedural Pain: 0 N/A N/A Post Procedural Pain: Procedure was tolerated well N/A N/A Debridement Treatment Response: 3x1.4x0.4 N/A N/A Post Debridement Measurements L x W x D (cm) 1.319 N/A N/A Post Debridement Volume: (cm) Excoriation: No N/A N/A Periwound Skin Texture: Induration: No Callus: No Crepitus: No Rash: No Scarring: No Maceration: No N/A N/A Periwound Skin Moisture: Dry/Scaly: No Atrophie Blanche:  No N/A N/A Periwound Skin Color: Cyanosis: No Ecchymosis: No Erythema: No Hemosiderin Staining: No Mottled: No Pallor: No Rubor: No No Abnormality N/A N/A Temperature: Cellular or Tissue Based Product N/A N/A Procedures Performed: Debridement Treatment Notes Wound #1 (Back) Wound Laterality: Midline Cleanser Peri-Wound Care Skin Prep Discharge Instruction: Use skin prep as directed Topical Primary Dressing Secondary Dressing ADAPTIC TOUCH 3x4.25 in Discharge Instruction: Applied over primary dressing secured with steri-strips as directed. Woven Gauze Sponge, Non-Sterile 4x4 in Discharge Instruction: Apply over primary dressing as directed. Zetuvit Plus Silicone Border Dressing 7x7(in/in) Discharge Instruction: Apply silicone border over primary dressing as directed. Secured With Compression Wrap Compression Stockings Add-Ons Electronic Signature(s) Signed: 02/21/2022 11:34:24 AM By: Kalman Shan DO Entered By: Kalman Shan on 02/21/2022 10:28:04 Rolly Salter ([id number]) [id number] Page 4 of 7 -------------------------------------------------------------------------------- Multi-Disciplinary Care Plan Details Patient Name: Date of Service: ASHLY, GOETHE 02/21/2022 9:15 A M Medical Record Number: [id number] Patient Account Number: [id number] Date of Birth/Sex: Treating RN: 09-26-1965 (56 y.o. [name] Primary Care Kelcy Laible: [name] Other Clinician: Referring Desiraye Rolfson: Treating Christianna Belmonte/Extender: [name] in Treatment: [place] reviewed with physician Active Inactive Wound/Skin Impairment Nursing Diagnoses: Impaired tissue integrity Goals: Patient/caregiver will verbalize understanding of skin care regimen Date Initiated: 03/28/2021 Target Resolution Date: 04/11/2022 Goal Status: Active Ulcer/skin breakdown will have a volume reduction of 30% by week 4 Date Initiated:  03/28/2021 Date Inactivated: 05/27/2021 Target Resolution Date: 05/31/2021 Goal Status: Met Interventions: Assess patient/caregiver ability to obtain necessary supplies Assess patient/caregiver ability to perform ulcer/skin care regimen upon admission and as needed Assess ulceration(s) every visit Provide education on ulcer and skin care Treatment Activities: Topical wound management initiated : 03/28/2021 Notes: Electronic Signature(s) Signed: 02/21/2022 2:17:30 PM By: Deon Pilling RN, BSN Entered By: Deon Pilling on 02/21/2022 09:53:10 -------------------------------------------------------------------------------- Pain Assessment Details Patient Name: Date of Service: [name] 02/21/2022 9:15 A M Medical Record Number: [id number] Patient Account Number: [id number] Date of Birth/Sex: Treating RN: [birth year] (56 y.o. [name] Primary Care Amir Glaus: [name] Other Clinician: Referring Maisy Newport: Treating Haillee Johann/Extender: [name] Weeks in Treatment: 34 Active Problems Location of Pain Severity and Description of Pain Patient Has Paino No Site Locations HARSH, TRULOCK ([id number]) [id number] Page 5 of 7 Pain Management and Medication Current Pain Management: Electronic Signature(s) Signed: 02/24/2022 4:10:29 PM By: [name] RN Entered By: [name] on  02/21/2022 09:33:20 -------------------------------------------------------------------------------- Patient/Caregiver Education Details Patient Name: Date of Service: ZACHERIE, HONEYMAN 11/10/2023andnbsp9:15 A M Medical Record Number: [id number] Patient Account Number: [id number] Date of Birth/Gender: Treating RN: 12/23/1965 (56 y.o. [name] Primary Care Physician: [name] Other Clinician: Referring Physician: Treating Physician/Extender: [name] in Treatment: 44 Education Assessment Education Provided  To: Patient Education Topics Provided Wound/Skin Impairment: Handouts: Skin Care Do's and Dont's Methods: Explain/Verbal Responses: Reinforcements needed Electronic Signature(s) Signed: 02/21/2022 2:17:30 PM By: Deon Pilling RN, BSN Entered By: Deon Pilling on 02/21/2022 09:53:20 -------------------------------------------------------------------------------- Wound Assessment Details Patient Name: Date of Service: [name] 02/21/2022 9:15 A M Medical Record Number: [id number] Patient Account Number: [id number] Date of Birth/Sex: Treating RN: 1965/08/05 (56 y.o. [name] Primary Care Autymn Omlor: [name] Other Clinician: CARLO, GUEVARRA ([id number]) [id number] Page 6 of 7 Referring Ercie Eliasen: Treating Ramsie Ostrander/Extender: [name] Weeks in Treatment: 47 Wound Status Wound Number: 1 Primary Etiology: Open Surgical Wound Wound Location: Midline Back Wound Status: Open Wounding Event: Surgical Injury Comorbid History: Hypertension, Type II Diabetes, Osteomyelitis Date Acquired: 01/11/2021 Weeks Of Treatment: 47 Clustered Wound: No Photos Wound Measurements Length: (cm) 3 Width: (cm) 0.4 Depth: (cm) 0.4 Area: (cm) 0.942 Volume: (cm) 0.377 % Reduction in Area: 98.8% % Reduction in Volume: 99.8% Epithelialization: Large (67-100%) Tunneling: Yes Position (o'clock): 12 Maximum Distance: (cm) 2.4 Undermining: No Wound Description Classification: Full Thickness With Exposed Suppo Wound Margin: Distinct, outline attached Exudate Amount: Medium Exudate Type: Serosanguineous Exudate Color: red, brown rt Structures Foul Odor After Cleansing: No Slough/Fibrino Yes Wound Bed Granulation Amount: Large (67-100%) Exposed Structure Granulation Quality: Red Fascia Exposed: No Necrotic Amount: Small (1-33%) Fat Layer (Subcutaneous Tissue) Exposed: Yes Necrotic Quality: Adherent Slough Tendon Exposed: No Muscle Exposed:  No Joint Exposed: No Bone Exposed: No Periwound Skin Texture Texture Color No Abnormalities Noted: No No Abnormalities Noted: No Callus: No Atrophie Blanche: No Crepitus: No Cyanosis: No Excoriation: No Ecchymosis: No Induration: No Erythema: No Rash: No Hemosiderin Staining: No Scarring: No Mottled: No Pallor: No Moisture Rubor: No No Abnormalities Noted: No Dry / Scaly: No Temperature / Pain Maceration: No Temperature: No Abnormality Treatment Notes Wound #1 (Back) Wound Laterality: Midline Cleanser Peri-Wound Care Skin Prep Discharge Instruction: Use skin prep as directed REYAANSH, MERLO ([id number]) [id number] Page 7 of 7 Topical Primary Dressing Secondary Dressing ADAPTIC TOUCH 3x4.25 in Discharge Instruction: Applied over primary dressing secured with steri-strips as directed. Woven Gauze Sponge, Non-Sterile 4x4 in Discharge Instruction: Apply over primary dressing as directed. Zetuvit Plus Silicone Border Dressing 7x7(in/in) Discharge Instruction: Apply silicone border over primary dressing as directed. Secured With Compression Wrap Compression Stockings Environmental education officer) Signed: 02/21/2022 2:17:30 PM By: Deon Pilling RN, BSN Signed: 02/24/2022 4:10:29 PM By: [name] RN Entered By: Deon Pilling on 02/21/2022 09:43:40 -------------------------------------------------------------------------------- Vitals Details Patient Name: Date of Service: Christian Brown E. 02/21/2022 9:15 A M Medical Record Number: [id number] Patient Account Number: [id number] Date of Birth/Sex: Treating RN: May 30, 1965 (56 y.o. [name] Primary Care Murel Shenberger: [name] Other Clinician: Referring Jasalyn Frysinger: Treating Dannie Hattabaugh/Extender: [name] Weeks in Treatment: 47 Vital Signs Time Taken: 09:32 Temperature (F): 98.7 Pulse (bpm): 80 Respiratory Rate (breaths/min): 17 Blood Pressure (mmHg):  132/71 Reference Range: 80 - 120 mg / dl Electronic Signature(s) Signed: 02/24/2022 4:10:29 PM By: [name] RN Entered By: [name] on 02/21/2022 09:33:15

## 2022-02-28 ENCOUNTER — Encounter (HOSPITAL_BASED_OUTPATIENT_CLINIC_OR_DEPARTMENT_OTHER): Payer: No Typology Code available for payment source | Admitting: Internal Medicine

## 2022-02-28 ENCOUNTER — Other Ambulatory Visit (HOSPITAL_COMMUNITY): Payer: Self-pay

## 2022-02-28 DIAGNOSIS — L98424 Non-pressure chronic ulcer of back with necrosis of bone: Secondary | ICD-10-CM | POA: Diagnosis not present

## 2022-02-28 DIAGNOSIS — E11622 Type 2 diabetes mellitus with other skin ulcer: Secondary | ICD-10-CM | POA: Diagnosis not present

## 2022-03-01 NOTE — Progress Notes (Signed)
Christian, JACUINDE ([id number]) [id number] Page 1 of 9 Visit Report for 02/28/2022 Chief Complaint Document Details Patient Name: Date of Service: [name], [name] 02/28/2022 9:00 A M Medical Record Number: [id number] Patient Account Number: [id number] Date of Birth/Sex: Treating RN: 1965/12/13 (56 y.o. M) Primary Care Provider: [name] Other Clinician: Referring Provider: Treating Provider/Extender: [name] Weeks in Treatment: 47 Information Obtained from: Patient Chief Complaint 03/28/2021; Back wound Electronic Signature(s) Signed: 02/28/2022 10:08:50 AM By: Kalman Shan DO Entered By: Kalman Shan on 02/28/2022 09:59:36 -------------------------------------------------------------------------------- Debridement Details Patient Name: Date of Service: Christian Brown E. 02/28/2022 9:00 A M Medical Record Number: [id number] Patient Account Number: [id number] Date of Birth/Sex: Treating RN: 01-30-1966 (56 y.o. [name] Primary Care Provider: [name] Other Clinician: Referring Provider: Treating Provider/Extender: [name] Weeks in Treatment: 48 Debridement Performed for Assessment: Wound #1 Midline Back Performed By: Physician Kalman Shan, DO Debridement Type: Debridement Level of Consciousness (Pre-procedure): Awake and Alert Pre-procedure Verification/Time Out Yes - 09:15 Taken: Start Time: 09:16 Pain Control: Lidocaine 5% topical ointment T Area Debrided (L x W): otal 1 (cm) x 0.1 (cm) = 0.1 (cm) Tissue and other material debrided: Viable, Non-Viable, Slough, Subcutaneous, Skin: Dermis , Skin: Epidermis, Slough Level: Skin/Subcutaneous Tissue Debridement Description: Excisional Instrument: Curette Bleeding: Minimum Hemostasis Achieved: Pressure End Time: 09:25 Procedural Pain: 0 Post Procedural Pain: 0 Response to Treatment: Procedure was tolerated well Level of Consciousness (Post-  Awake and Alert procedure): Post Debridement Measurements of Total Wound Length: (cm) 1 Width: (cm) 0.1 Depth: (cm) 0.4 Volume: (cm) 0.031 Character of Wound/Ulcer Post Debridement: Improved Post Procedure Diagnosis JAYEN, BROMWELL ([id number]) [id number] Page 2 of 9 Same as Pre-procedure Electronic Signature(s) Signed: 02/28/2022 10:08:50 AM By: Kalman Shan DO Signed: 02/28/2022 5:21:52 PM By: Deon Pilling RN, BSN Entered By: Deon Pilling on 02/28/2022 09:29:30 -------------------------------------------------------------------------------- HPI Details Patient Name: Date of Service: Christian Brown E. 02/28/2022 9:00 A M Medical Record Number: [id number] Patient Account Number: [id number] Date of Birth/Sex: Treating RN: Feb 26, 1966 (56 y.o. M) Primary Care Provider: [name] Other Clinician: Referring Provider: Treating Provider/Extender: [name] Weeks in Treatment: 24 History of Present Illness HPI Description: Admission 03/28/2021 [name] is a 56 year old male with a past medical history of controlled type 2 diabetes on oral agents, obesity and OSA that presents to the clinic for a back wound. On 01/11/2021 patient had a laminectomy with PLIF of L1-S1 by Dr. Venetia Constable because of lumbar stenosis and radiculopathy. He subsequently developed bacteremia. He had CT imaging on 10/13 of the lumbar spine that showed fluid collection in the soft tissue of the posterior L1 and S1 and was taken to the OR for washout on 10/14. 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 He received 4 weeks of IV antibiotics by infectious disease. After his completion of 4 weeks of IV antibiotics he was continued for an additional 4 weeks of IV cefazolin with a stop date of 12/29. He has been evaluated by plastic surgery and no plans for surgical intervention at this time. Wife is present and reports he has been on the wound VAC  for the past 8 weeks with improvement in wound healing. He currently denies systemic signs of infection. 12/22; patient presents for follow-up. He reports no issues since last clinic visit. He denies signs of infection. He has been tolerating the wound VAC well. 12/30; patient presents for follow-up. He reports no issues and has  no complaints today. He has been tolerating the wound VAC well. 1/9; patient presents for follow-up. He has no issues or complaints today. He states he feels well. He has had no problems with the wound VAC. 1/16; patient presents for follow-up. He continues to use the wound VAC with no issues. He denies signs of infection. 1/23; patient presents for follow-up. He has been switched from IV cefazolin to oral cefadroxil by infectious disease. He has no issues or complaints today. He denies signs of infection. He continues to tolerate the wound VAC well. 1/30; patient presents for follow-up. He continues to tolerate the wound VAC well. 2/6; patient presents for follow-up. He has no issues or complaints today. He continues to tolerate the wound VAC well. He denies signs of infection. 2/13; patient presents for follow-up. He continues to do well with the wound VAC. He denies any issues. 2/27; patient presents for follow-up. He continues to use the wound VAC without any issues. He denies signs of infection. 3/20; patient presents for follow-up. He has no issues or complaints today. He continues to use the wound VAC. 4/3; patient presents for follow-up. He continues to use the wound VAC without issues. He denies signs of infection. 4/17; 2-week follow-up. He continues to do well. His measurements are improved. Initially a surgical wound complicated by infection. 5/1; patient presents for follow-up. He has no issues or complaints today. He continues to tolerate the wound VAC well. He denies signs of infection. 5/15; patient presents for follow-up. He has noted more maceration to the  periwound. He has been using the wound VAC without issues. He currently denies signs of infection. 5/30; patient presents for follow-up. He has been tolerating the wound VAC well over the past 2 weeks. He no longer has maceration to the periwound. He has no issues or complaints today. 6/8; this patient with a postsurgical wound that was complicated by postop infection. He has been using silver collagen under wound VAC and gradually doing well improvement in dimensions especially the tunnel at 12:00 6/22; patient presents for follow-up. We have been using silver collagen under the wound VAC. Patient has no issues or questions today. 7/6; patient presents for follow-up. Patient continues to use collagen under the wound VAC with no issues. 7/20; patient presents for follow-up. He has been using collagen under the wound VAC with no issues. He has been approved for Epicord. This was discussed with the patient and he is in agreement to having this placed at next clinic visit. 7/27; patient presents for follow-up. He has been tolerating the wound VAC well with collagen underneath. He has been approved for epi cord and we have this to place in office today. 8/4; patient presents for follow-up. He had Epicort placed at last clinic visit. We held off on the wound VAC. He tolerated this well. He has no issues or complaints today. CARLESTER, KASPAREK ([id number]) [id number] Page 3 of 9 8/11; patient presents for follow-up. Epicord was placed at last clinic visit. He had the wound VAC on for the past week. He states that no drainage was suctioned into the canister. He has slight maceration to the periwound. 8/18; patient presents for follow-up. Epicord #3 was placed in standard fashion last week. The wound VAC was held. He has no issues or complaints today. 8/25; Patient presents for follow-up. Epicord #4 was placed in standard fashion last week. The wound VAC was held again. He has no  issues or complaints today. There has been improvement in  wound healing. 9/1; patient presents for follow-up. Epicort #5 was placed in standard fashion last week. He has no issues or complaints today. 9/8; patient presents for follow-up. Epicord #6 was placed in standard fashion last week. He tolerated this well and has no issues or complaints today. 9/15; Epicort No. 7 the wound looks healthy. Perhaps some hyper granulated tissue in the lower 50% of the longitudinal wound 9/22; Epicord #8 placed at last clinic visit. Wound appears well-healing. Patient has no issues or complaints today. 9/28. Epicord No. 9. Continued improvement. 10/6; patient has completed the allotted epicord Applications that insurance would cover. Insurance denied extending more applications. Patient has no issues or complaints today. He tolerated the last application well. 10/13; patient presents for follow-up. We have been using endoform to the wound bed over the past week. He has been approved for 5 more applications of Epicord. 10/20; patient presents for follow-up. Epicort was placed at last clinic visit. He has no issues or complaints today. He denies signs of infection. 10/27; patient presents for follow-up. Epicort was placed in standard fashion at last clinic visit. He has no issues or complaints today. He denies signs of infection. 11/3; Epicort No. 13. Tunneling depth at 12:00 is 2.5 cm as measured by myself 11/10; last patient's approved Epicort #15 was placed in standard fashion today. Tunneling is stable however has shown improvement in healing over the past several weeks. He has no issues or complaints today. 11/17; patient presents for follow-up. He has completed his course of approved epi cord dressings. He has improved greatly in his wound healing. The tunnel is almost completely filled in. He has no issues or complaints today. Electronic Signature(s) Signed: 02/28/2022 10:08:50 AM By: Kalman Shan  DO Entered By: Kalman Shan on 02/28/2022 10:00:24 -------------------------------------------------------------------------------- Physical Exam Details Patient Name: Date of Service: Christian Brown E. 02/28/2022 9:00 A M Medical Record Number: [id number] Patient Account Number: [id number] Date of Birth/Sex: Treating RN: [birth year] (56 y.o. M) Primary Care Provider: [name] Other Clinician: Referring Provider: Treating Provider/Extender: [name] Weeks in Treatment: 48 Constitutional respirations regular, non-labored and within target range for patient.. Cardiovascular 2+ dorsalis pedis/posterior tibialis pulses. Psychiatric pleasant and cooperative. Notes Wound exam; lumbar spine surgical incision site. Tunnel at the 12 o'clock position About 0.5 cm. Inferior to this is a slitlike wound with granulation tissue and slough. Undermining to these edges. No signs of infection. Electronic Signature(s) Signed: 02/28/2022 10:08:50 AM By: Kalman Shan DO Entered By: Kalman Shan on 02/28/2022 10:03:38 Rolly Salter ([id number]) [id number] Page 4 of 9 -------------------------------------------------------------------------------- Physician Orders Details Patient Name: Date of Service: SOLACE, WENDORFF 02/28/2022 9:00 A M Medical Record Number: [id number] Patient Account Number: [id number] Date of Birth/Sex: Treating RN: [birth year] (56 y.o. 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 - Dr. Heber [place] Return A ppointment in 2 weeks. - Friday  Dr. Heber [employer] Return A Anesthetic (In clinic) Topical Lidocaine 5% applied to wound bed Cellular or Tissue Based Products Cellular or Tissue Based Product Type: - Epicord #3 applied 11/22/21 Epicord #4 11/29/21 Epicord #5 12/06/2021 Epicord #6 12/13/21 Epicord # 7 12/20/21 Epicord # 8 12/27/21 Epicord # 9 01/03/22 Epicord #  10 01/09/22 Epicord # 11 01/24/22 Epicord # 12 01/31/22 Epicord #13 02/07/2022 Epicord #14 02/14/2022 Epicord #15 02/21/2022 daptic or Mepitel. (DO NOT REMOVE). - leave Cellular or Tissue Based Product applied to wound bed, secured with steri-strips, cover with A the adaptic and steri-strips in place. Bathing/ Shower/ Hygiene May shower with protection but do not get wound dressing(s) wet. Additional Orders / Instructions Follow Nutritious Diet - Continue to monitor blood sugars daily Wound Treatment Wound #1 - Back Wound Laterality: Midline Peri-Wound Care: Skin Prep Every Other Day/30 Days Discharge Instructions: Use skin prep as directed Prim Dressing: Endoform 2x2 in Every Other Day/30 Days ary Discharge Instructions: Moisten with [place] Jelly lightly pack into tunnel and depth of wound. Secondary Dressing: Woven Gauze Sponge, Non-Sterile 4x4 in Every Other Day/30 Days Discharge Instructions: Apply over primary dressing as directed. Secondary Dressing: Zetuvit Plus Silicone Border Dressing 7x7(in/in) (Generic) Every Other Day/30 Days Discharge Instructions: Apply silicone border over primary dressing as directed. Electronic Signature(s) Signed: 02/28/2022 10:08:50 AM By: Kalman Shan DO Entered By: Kalman Shan on 02/28/2022 10:03:45 Rolly Salter ([id number]) [id number] Page 5 of 9 -------------------------------------------------------------------------------- Problem List Details Patient Name: Date of Service: YIDA, HYAMS 02/28/2022 9:00 A M Medical Record Number: [id number] Patient Account Number: [id number] Date  of Birth/Sex: Treating RN: 1965-12-26 (56 y.o. 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 02/28/2022 9:00 A M Medical Record Number: [id number] Patient Account Number: [id number] Date of Birth/Sex: Treating RN: July 31, 1965 (56 y.o. M) Primary Care Provider: [name] Other Clinician: Referring Provider: Treating Provider/Extender: [name] Weeks in Treatment: 9 Subjective Chief Complaint Information obtained from Patient 03/28/2021; Back wound History of Present Illness (HPI) Admission 03/28/2021 Mr. Nichols Corter is a 56 year old male with a past medical history of controlled type 2 diabetes on oral agents, obesity and OSA that presents to the clinic for a back wound. On 01/11/2021 patient had a laminectomy with PLIF of L1-S1 by Dr. Venetia Constable because of lumbar stenosis and radiculopathy. He subsequently developed bacteremia. He had CT imaging on 10/13 of the lumbar spine that showed fluid collection in the soft tissue of the posterior L1 and S1 and was taken to the OR for washout on 10/14. 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 He received 4 weeks of IV antibiotics by infectious disease. After his completion of 4 weeks of  IV antibiotics he was continued for an additional 4 weeks of IV cefazolin with a stop date of 12/29. He has been evaluated by plastic surgery and no plans for surgical intervention at this time. Wife is present and reports he has been on the wound VAC for the past 8 weeks with improvement in wound healing. He currently denies systemic signs of infection. DEMARQUEZ, CIOLEK ([id number]) [id number] Page 6 of 9 12/22; patient presents for follow-up. He reports no issues since last clinic visit. He denies signs of infection. He has been tolerating the wound VAC well. 12/30;  patient presents for follow-up. He reports no issues and has no complaints today. He has been tolerating the wound VAC well. 1/9; patient presents for follow-up. He has no issues or complaints today. He states he feels well. He has had no problems with the wound VAC. 1/16; patient presents for follow-up. He continues to use the wound VAC with no issues. He denies signs of infection. 1/23; patient presents for follow-up. He has been switched from IV cefazolin to oral cefadroxil by infectious disease. He has no issues or complaints today. He denies signs of infection. He continues to tolerate the wound VAC well. 1/30; patient presents for follow-up. He continues to tolerate the wound VAC well. 2/6; patient presents for follow-up. He has no issues or complaints today. He continues to tolerate the wound VAC well. He denies signs of infection. 2/13; patient presents for follow-up. He continues to do well with the wound VAC. He denies any issues. 2/27; patient presents for follow-up. He continues to use the wound VAC without any issues. He denies signs of infection. 3/20; patient presents for follow-up. He has no issues or complaints today. He continues to use the wound VAC. 4/3; patient presents for follow-up. He continues to use the wound VAC without issues. He denies signs of infection. 4/17; 2-week follow-up. He  continues to do well. His measurements are improved. Initially a surgical wound complicated by infection. 5/1; patient presents for follow-up. He has no issues or complaints today. He continues to tolerate the wound VAC well. He denies signs of infection. 5/15; patient presents for follow-up. He has noted more maceration to the periwound. He has been using the wound VAC without issues. He currently denies signs of infection. 5/30; patient presents for follow-up. He has been tolerating the wound VAC well over the past 2 weeks. He no longer has maceration to the periwound. He has no issues or complaints today. 6/8; this patient with a postsurgical wound that was complicated by postop infection. He has been using silver collagen under wound VAC and gradually doing well improvement in dimensions especially the tunnel at 12:00 6/22; patient presents for follow-up. We have been using silver collagen under the wound VAC. Patient has no issues or questions today. 7/6; patient presents for follow-up. Patient continues to use collagen under the wound VAC with no issues. 7/20; patient presents for follow-up. He has been using collagen under the wound VAC with no issues. He has been approved for Epicord. This was discussed with the patient and he is in agreement to having this placed at next clinic visit. 7/27; patient presents for follow-up. He has been tolerating the wound VAC well with collagen underneath. He has been approved for epi cord and we have this to place in office today. 8/4; patient presents for follow-up. He had Epicort placed at last clinic visit. We held off on the wound VAC. He tolerated this well. He has no issues or complaints today. 8/11; patient presents for follow-up. Epicord was placed at last clinic visit. He had the wound VAC on for the past week. He states that no drainage was suctioned into the canister. He has slight maceration to the periwound. 8/18; patient presents for  follow-up. Epicord #3 was placed in standard fashion last week. The wound VAC was held. He has no issues or complaints today. 8/25; Patient presents for follow-up. Epicord #4 was placed in standard fashion last week. The wound VAC was held again. He has no issues or complaints today. There has been improvement  in wound healing. 9/1; patient presents for follow-up. Epicort #5 was placed in standard fashion last week. He has no issues or complaints today. 9/8; patient presents for follow-up. Epicord #6 was placed in standard fashion last week. He tolerated this well and has no issues or complaints today. 9/15; Epicort No. 7 the wound looks healthy. Perhaps some hyper granulated tissue in the lower 50% of the longitudinal wound 9/22; Epicord #8 placed at last clinic visit. Wound appears well-healing. Patient has no issues or complaints today. 9/28. Epicord No. 9. Continued improvement. 10/6; patient has completed the allotted epicord Applications that insurance would cover. Insurance denied extending more applications. Patient has no issues or complaints today. He tolerated the last application well. 10/13; patient presents for follow-up. We have been using endoform to the wound bed over the past week. He has been approved for 5 more applications of Epicord. 10/20; patient presents for follow-up. Epicort was placed at last clinic visit. He has no issues or complaints today. He denies signs of infection. 10/27; patient presents for follow-up. Epicort was placed in standard fashion at last clinic visit. He has no issues or complaints today. He denies signs of infection. 11/3; Epicort No. 13. Tunneling depth at 12:00 is 2.5 cm as measured by myself 11/10; last patient's approved Epicort #15 was placed in standard fashion today. Tunneling is stable however has shown improvement in healing over the past several weeks. He has no issues or complaints today. 11/17; patient presents for follow-up. He has  completed his course of approved epi cord dressings. He has improved greatly in his wound healing. The tunnel is almost completely filled in. He has no issues or complaints today. Patient History Information obtained from Patient. Family History Diabetes - Mother, Stroke - Siblings, SHAQUELLE, HERNON ([id number]) [id number] Page 7 of 9 No family history of Cancer, Heart Disease, Hereditary Spherocytosis, Hypertension, Kidney Disease, Lung Disease, Seizures, Thyroid Problems, Tuberculosis. Social History Never smoker, Marital Status - Married, Alcohol Use - Rarely, Drug Use - No History, Caffeine Use - Rarely. Medical History Cardiovascular Patient has history of Hypertension Endocrine Patient has history of Type II Diabetes Musculoskeletal Patient has history of Osteomyelitis Medical A Surgical History Notes nd Musculoskeletal DDD Objective Constitutional respirations regular, non-labored and within target range for patient.. Vitals Time Taken: 8:59 AM, Temperature: 97.8 F, Pulse: 92 bpm, Respiratory Rate: 18 breaths/min, Blood Pressure: 151/84 mmHg. Cardiovascular 2+ dorsalis pedis/posterior tibialis pulses. Psychiatric pleasant and cooperative. General Notes: Wound exam; lumbar spine surgical incision site. Tunnel at the 12 o'clock position About 0.5 cm. Inferior to this is a slitlike wound with granulation tissue and slough. Undermining to these edges. No signs of infection. Integumentary (Hair, Skin) Wound #1 status is Open. Original cause of wound was Surgical Injury. The date acquired was: 01/11/2021. The wound has been in treatment 48 weeks. The wound is located on the Midline Back. The wound measures 1cm length x 0.1cm width x 0.4cm depth; 0.079cm^2 area and 0.031cm^3 volume. There is Fat Layer (Subcutaneous Tissue) exposed. There is no tunneling or undermining noted. There is a medium amount of serosanguineous drainage noted. The wound margin is  distinct with the outline attached to the wound base. There is large (67-100%) pink granulation within the wound bed. There is a small (1-33%) amount of necrotic tissue within the wound bed including Adherent Slough. The periwound skin appearance did not exhibit: Callus, Crepitus, Excoriation, Induration, Rash, Scarring, Dry/Scaly, Maceration, Atrophie Blanche, Cyanosis, Ecchymosis, Hemosiderin Staining, Mottled, Pallor, Rubor, Erythema. Periwound  temperature was noted as No Abnormality. 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 The tunnel is almost completely healed. He has a slitlike wound below this that I debrided. This does have undermining. He is not approved for more skin substitutes by his insurance. I recommended at this time endoform with hydrogel to the wound beds. Follow-up in 1 week. Procedures Wound #1 Pre-procedure diagnosis of Wound #1 is an Open Surgical Wound located on the Midline Back . There was a Excisional Skin/Subcutaneous Tissue Debridement with a total area of 0.1 sq cm performed by Kalman Shan, DO. With the following instrument(s): Curette to remove Viable and Non-Viable tissue/material. Material removed includes Subcutaneous Tissue, Slough, Skin: Dermis, and Skin: Epidermis after achieving pain control using Lidocaine 5% topical ointment. A time out was conducted at 09:15, prior to the start of the procedure. A Minimum amount of bleeding was controlled with Pressure. The procedure was tolerated well with a pain level of 0 throughout and a pain level of 0 following the procedure. Post Debridement Measurements: 1cm length x 0.1cm width x 0.4cm depth; 0.031cm^3 volume. Character of Wound/Ulcer Post Debridement is improved. Post procedure Diagnosis Wound #1: Same  as Pre-Procedure BENNET, KUJAWA ([id number]) [id number] Page 8 of 9 Plan Follow-up Appointments: Return Appointment in 1 week. - Dr. Heber Reyno Return Appointment in 2 weeks. - Friday Dr. Heber Woodruff Anesthetic: (In clinic) Topical Lidocaine 5% applied to wound bed Cellular or Tissue Based Products: Cellular or Tissue Based Product Type: - Epicord #3 applied 11/22/21 Epicord #4 11/29/21 Epicord #5 12/06/2021 Epicord #6 12/13/21 Epicord # 7 12/20/21 Epicord # 8 12/27/21 Epicord # 9 01/03/22 Epicord # 10 01/09/22 Epicord # 11 01/24/22 Epicord # 12 01/31/22 Epicord #13 02/07/2022 Epicord #14 02/14/2022 Epicord #15 02/21/2022 Cellular or Tissue Based Product applied to wound bed, secured with steri-strips, cover with Adaptic or Mepitel. (DO NOT REMOVE). - leave the adaptic and steri-strips in place. Bathing/ Shower/ Hygiene: May shower with protection but do not get wound dressing(s) wet. Additional Orders / Instructions: Follow Nutritious Diet - Continue to monitor blood sugars daily WOUND #1: - Back Wound Laterality: Midline Peri-Wound Care: Skin Prep Every Other Day/30 Days Discharge Instructions: Use skin prep as directed Prim Dressing: Endoform 2x2 in Every Other Day/30 Days ary Discharge Instructions: Moisten with [place] Jelly lightly pack into tunnel and depth of wound. Secondary Dressing: Woven Gauze Sponge, Non-Sterile 4x4 in Every Other Day/30 Days Discharge Instructions: Apply over primary dressing as directed. Secondary Dressing: Zetuvit Plus Silicone Border Dressing 7x7(in/in) (Generic) Every Other Day/30 Days Discharge Instructions: Apply silicone border over primary dressing as directed. 1. In office sharp debridement 2. Endoform with hydrogel 3. Follow-up in 1 week Electronic Signature(s) Signed: 02/28/2022 10:08:50 AM By: Kalman Shan DO Entered By: Kalman Shan on 02/28/2022  10:05:01 -------------------------------------------------------------------------------- HxROS Details Patient Name: Date of Service: Christian Brown E. 02/28/2022 9:00 A M Medical Record Number: [id number] Patient Account Number: [id number] Date of Birth/Sex: Treating RN: 12/04/1965 (56 y.o. M) Primary Care Provider: [name] Other Clinician: Referring Provider: Treating Provider/Extender: [name] Weeks in Treatment: 48 Information Obtained From Patient Cardiovascular Medical History: Positive for: Hypertension Endocrine Medical History: Positive for: Type II Diabetes Time with diabetes: Since mid 90's Treated with: Oral agents Blood sugar tested every day: Yes Tested : 2x day Musculoskeletal Medical History: Positive for:  Osteomyelitis GAYLON, BENTZ ([id number]) [id number] Page 9 of 9 Past Medical History Notes: DDD Immunizations Pneumococcal Vaccine: Received Pneumococcal Vaccination: Yes Received Pneumococcal Vaccination On or After 60th Birthday: No Implantable Devices Yes Family and Social History Cancer: No; Diabetes: Yes - Mother; Heart Disease: No; Hereditary Spherocytosis: No; Hypertension: No; Kidney Disease: No; Lung Disease: No; Seizures: No; Stroke: Yes - Siblings; Thyroid Problems: No; Tuberculosis: No; Never smoker; Marital Status - Married; Alcohol Use: Rarely; Drug Use: No History; Caffeine Use: Rarely; Financial Concerns: No; Food, Clothing or Shelter Needs: No; Support System Lacking: No; Transportation Concerns: No Electronic Signature(s) Signed: 02/28/2022 10:08:50 AM By: Kalman Shan DO Entered By: Kalman Shan on 02/28/2022 10:00:29 -------------------------------------------------------------------------------- SuperBill Details Patient Name: Date of Service: [name] 02/28/2022 Medical Record Number: [id number] Patient Account Number: [id number] Date of Birth/Sex: Treating  RN: 07/24/1965 (56 y.o. 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

## 2022-03-01 NOTE — Progress Notes (Signed)
THAI, BURGUENO ([id number]) [id number] Page 1 of 7 Visit Report for 02/28/2022 Arrival Information Details Patient Name: Date of Service: [name], [name] 02/28/2022 9:00 A M Medical Record Number: [id number] Patient Account Number: [id number] Date of Birth/Sex: Treating RN: 1966/01/04 (56 y.o. M) Primary Care Brinley Treanor: [name] Other Clinician: Referring Maddison Kilner: Treating Zyliah Schier/Extender: [name] Weeks in Treatment: 75 Visit Information History Since Last Visit Added or deleted any medications: No Patient Arrived: Ambulatory Any new allergies or adverse reactions: No Arrival Time: 08:58 Had a fall or experienced change in No Accompanied By: self activities of daily living that may affect Transfer Assistance: None risk of falls: Patient Identification Verified: Yes Signs or symptoms of abuse/neglect since last visito No Secondary Verification Process Completed: Yes Hospitalized since last visit: No Patient Requires Transmission-Based Precautions: No Implantable device outside of the clinic excluding No Patient Has Alerts: Yes cellular tissue based products placed in the center Patient Alerts: Patient on Blood Thinner since last visit: No BP Right Arm-PICC Has Dressing in Place as Prescribed: Yes Pain Present Now: No Electronic Signature(s) Signed: 02/28/2022 12:40:42 PM By: Erenest Blank Entered By: Erenest Blank on 02/28/2022 08:58:59 -------------------------------------------------------------------------------- Encounter Discharge Information Details Patient Name: Date of Service: Christian Brown E. 02/28/2022 9:00 A M Medical Record Number: [id number] Patient Account Number: [id number] Date of Birth/Sex: Treating RN: 04-03-1966 (56 y.o. [name] Primary Care Ronak Duquette: [name] Other Clinician: Referring Zamaria Brazzle: Treating Fillmore Bynum/Extender: [name] Weeks in Treatment: 48 Encounter Discharge  Information Items Post Procedure Vitals Discharge Condition: Stable Temperature (F): 97.8 Ambulatory Status: Ambulatory Pulse (bpm): 92 Discharge Destination: Home Respiratory Rate (breaths/min): 18 Transportation: Private Auto Blood Pressure (mmHg): 151/84 Accompanied By: self Schedule Follow-up Appointment: Yes Clinical Summary of Care: Electronic Signature(s) Signed: 02/28/2022 5:21:52 PM By: Deon Pilling RN, BSN Entered By: Deon Pilling on 02/28/2022 09:31:56 Rolly Salter ([id number]) [id number] Page 2 of 7 -------------------------------------------------------------------------------- Lower Extremity Assessment Details Patient Name: Date of Service: [name], [name] 02/28/2022 9:00 A M Medical Record Number: [id number] Patient Account Number: [id number] Date of Birth/Sex: Treating RN: 09-28-1965 (56 y.o. M) Primary Care Emira Eubanks: [name] Other Clinician: Referring Danyal Adorno: Treating Ashrita Chrismer/Extender: [name] Weeks in Treatment: 48 Electronic Signature(s) Signed: 02/28/2022 12:40:42 PM By: Erenest Blank Entered By: Erenest Blank on 02/28/2022 09:00:01 -------------------------------------------------------------------------------- Multi Wound Chart Details Patient Name: Date of Service: Christian Brown E. 02/28/2022 9:00 A M Medical Record Number: [id number] Patient Account Number: [id number] Date of Birth/Sex: Treating RN: [birth year] (56 y.o. M) Primary Care Tangela Dolliver: [name] Other Clinician: Referring Kael Keetch: Treating Dariyon Urquilla/Extender: [name] Weeks in Treatment: 48 Vital Signs Height(in): Pulse(bpm): 92 Weight(lbs): Blood Pressure(mmHg): 151/84 Body Mass Index(BMI): Temperature(F): 97.8 Respiratory Rate(breaths/min): 18 [1:Photos:] [N/A:N/A] Midline Back N/A N/A Wound Location: Surgical Injury N/A N/A Wounding Event: Open Surgical Wound N/A N/A Primary Etiology: Hypertension, Type  II Diabetes, N/A N/A Comorbid History: Osteomyelitis 01/11/2021 N/A N/A Date Acquired: 48 N/A N/A Weeks of Treatment: Open N/A N/A Wound Status: No N/A N/A Wound Recurrence: 1x0.1x0.4 N/A N/A Measurements L x W x D (cm) 0.079 N/A N/A A (cm) : rea 0.031 N/A N/A Volume (cm) : 99.90% N/A N/A % Reduction in Area: 100.00% N/A N/A % Reduction in Volume: Full Thickness With Exposed Support N/A N/A Classification: Structures Medium N/A N/A Exudate Amount: Serosanguineous N/A N/A Exudate Type: red, brown N/A N/A Exudate Color: Distinct, outline attached N/A N/A Wound Margin: Large (67-100%) N/A N/A Granulation Amount: Pink N/A  N/A Granulation QualityAZZAM, MEHRA ([id number]) [id number] Page 3 of 7 Small (1-33%) N/A N/A Necrotic Amount: Fat Layer (Subcutaneous Tissue): Yes N/A N/A Exposed Structures: Fascia: No Tendon: No Muscle: No Joint: No Bone: No Large (67-100%) N/A N/A Epithelialization: Debridement - Excisional N/A N/A Debridement: Pre-procedure Verification/Time Out 09:15 N/A N/A Taken: Lidocaine 5% topical ointment N/A N/A Pain Control: Subcutaneous, Slough N/A N/A Tissue Debrided: Skin/Subcutaneous Tissue N/A N/A Level: 0.1 N/A N/A Debridement A (sq cm): rea Curette N/A N/A Instrument: Minimum N/A N/A Bleeding: Pressure N/A N/A Hemostasis A chieved: 0 N/A N/A Procedural Pain: 0 N/A N/A Post Procedural Pain: Procedure was tolerated well N/A N/A Debridement Treatment Response: 1x0.1x0.4 N/A N/A Post Debridement Measurements L x W x D (cm) 0.031 N/A N/A Post Debridement Volume: (cm) Excoriation: No N/A N/A Periwound Skin Texture: Induration: No Callus: No Crepitus: No Rash: No Scarring: No Maceration: No N/A N/A Periwound Skin Moisture: Dry/Scaly: No Atrophie Blanche: No N/A N/A Periwound Skin Color: Cyanosis: No Ecchymosis: No Erythema: No Hemosiderin Staining: No Mottled: No Pallor:  No Rubor: No No Abnormality N/A N/A Temperature: Debridement N/A N/A Procedures Performed: Treatment Notes Wound #1 (Back) Wound Laterality: Midline Cleanser Peri-Wound Care Skin Prep Discharge Instruction: Use skin prep as directed Topical Primary Dressing Endoform 2x2 in Discharge Instruction: Moisten with [place] Jelly lightly pack into tunnel and depth of wound. Secondary Dressing Woven Gauze Sponge, Non-Sterile 4x4 in Discharge Instruction: Apply over primary dressing as directed. Zetuvit Plus Silicone Border Dressing 7x7(in/in) Discharge Instruction: Apply silicone border over primary dressing as directed. Secured With Compression Wrap Compression Stockings Add-Ons Electronic Signature(s) Signed: 02/28/2022 10:08:50 AM By: Kalman Shan DO Entered By: Kalman Shan on 02/28/2022 09:59:26 Palomo, Ofilia Neas ([id number]) [id number] Page 4 of 7 -------------------------------------------------------------------------------- Multi-Disciplinary Care Plan Details Patient Name: Date of Service: [name], [name] 02/28/2022 9:00 A M Medical Record Number: [id number] Patient Account Number: [id number] Date of Birth/Sex: Treating RN: 08-25-1965 (56 y.o. [name] Primary Care Sintia Mckissic: [name] Other Clinician: Referring Aniello Christopoulos: Treating Brindley Madarang/Extender: [name] Weeks in Treatment: [place] reviewed with physician Active Inactive Wound/Skin Impairment Nursing Diagnoses: Impaired tissue integrity Goals: Patient/caregiver will verbalize understanding of skin care regimen Date Initiated: 03/28/2021 Target Resolution Date: 04/11/2022 Goal Status: Active Ulcer/skin breakdown will have a volume reduction of 30% by week 4 Date Initiated: 03/28/2021 Date Inactivated: 05/27/2021 Target Resolution Date: 05/31/2021 Goal Status: Met Interventions: Assess patient/caregiver ability to obtain necessary  supplies Assess patient/caregiver ability to perform ulcer/skin care regimen upon admission and as needed Assess ulceration(s) every visit Provide education on ulcer and skin care Treatment Activities: Topical wound management initiated : 03/28/2021 Notes: Electronic Signature(s) Signed: 02/28/2022 5:21:52 PM By: Deon Pilling RN, BSN Entered By: Deon Pilling on 02/28/2022 09:30:16 -------------------------------------------------------------------------------- Pain Assessment Details Patient Name: Date of Service: Christian Brown E. 02/28/2022 9:00 A M Medical Record Number: [id number] Patient Account Number: [id number] Date of Birth/Sex: Treating RN: [birth year] (56 y.o. M) Primary Care Delsy Etzkorn: [name] Other Clinician: Referring Henrine Hayter: Treating Rhyann Berton/Extender: [name] Weeks in Treatment: 48 Active Problems Location of Pain Severity and Description of Pain Patient Has Paino No Site Locations [name], [name] ([id number]) [id number] Page 5 of 7 Pain Management and Medication Current Pain Management: Electronic Signature(s) Signed: 02/28/2022 12:40:42 PM By: Erenest Blank Entered By: Erenest Blank on 02/28/2022 08:59:35 -------------------------------------------------------------------------------- Patient/Caregiver Education Details Patient Name: Date of Service: [name] 11/17/2023andnbsp9:00 [place] Record Number: [id number] Patient Account Number: [id number]  Date of Birth/Gender: Treating RN: 1965/09/21 (56 y.o. [name] Primary Care Physician: [name] Other Clinician: Referring Physician: Treating Physician/Extender: [name] in Treatment: 42 Education Assessment Education Provided To: Patient Education Topics Provided Wound/Skin Impairment: Handouts: Skin Care Do's and Dont's Methods: Explain/Verbal Responses: Reinforcements needed Electronic Signature(s) Signed:  02/28/2022 5:21:52 PM By: Deon Pilling RN, BSN Entered By: Deon Pilling on 02/28/2022 09:30:30 -------------------------------------------------------------------------------- Wound Assessment Details Patient Name: Date of Service: Christian Brown E. 02/28/2022 9:00 A M Medical Record Number: [id number] Patient Account Number: [id number] Date of Birth/Sex: Treating RN: [birth year] (56 y.o. M) Primary Care Raif Chachere: [name] Other Clinician: KATO, [name] ([id number]) [id number] Page 6 of 7 Referring Koltyn Kelsay: Treating Cincere Zorn/Extender: [name] Weeks in Treatment: 48 Wound Status Wound Number: 1 Primary Etiology: Open Surgical Wound Wound Location: Midline Back Wound Status: Open Wounding Event: Surgical Injury Comorbid History: Hypertension, Type II Diabetes, Osteomyelitis Date Acquired: 01/11/2021 Weeks Of Treatment: 48 Clustered Wound: No Photos Wound Measurements Length: (cm) Width: (cm) Depth: (cm) Area: (cm) Volume: (cm) 1 % Reduction in Area: 99.9% 0.1 % Reduction in Volume: 100% 0.4 Epithelialization: Large (67-100%) 0.079 Tunneling: No 0.031 Undermining: No Wound Description Classification: Full Thickness With Exposed Suppo Wound Margin: Distinct, outline attached Exudate Amount: Medium Exudate Type: Serosanguineous Exudate Color: red, brown rt Structures Foul Odor After Cleansing: No Slough/Fibrino Yes Wound Bed Granulation Amount: Large (67-100%) Exposed Structure Granulation Quality: Pink Fascia Exposed: No Necrotic Amount: Small (1-33%) Fat Layer (Subcutaneous Tissue) Exposed: Yes Necrotic Quality: Adherent Slough Tendon Exposed: No Muscle Exposed: No Joint Exposed: No Bone Exposed: No Periwound Skin Texture Texture Color No Abnormalities Noted: No No Abnormalities Noted: No Callus: No Atrophie Blanche: No Crepitus: No Cyanosis: No Excoriation: No Ecchymosis: No Induration: No Erythema:  No Rash: No Hemosiderin Staining: No Scarring: No Mottled: No Pallor: No Moisture Rubor: No No Abnormalities Noted: No Dry / Scaly: No Temperature / Pain Maceration: No Temperature: No Abnormality Treatment Notes Wound #1 (Back) Wound Laterality: Midline Cleanser Peri-Wound Care Skin Prep Discharge Instruction: Use skin prep as directed Topical [name], [name] ([id number]) [id number] Page 7 of 7 Primary Dressing Endoform 2x2 in Discharge Instruction: Moisten with [place] Jelly lightly pack into tunnel and depth of wound. Secondary Dressing Woven Gauze Sponge, Non-Sterile 4x4 in Discharge Instruction: Apply over primary dressing as directed. Zetuvit Plus Silicone Border Dressing 7x7(in/in) Discharge Instruction: Apply silicone border over primary dressing as directed. Secured With Compression Wrap Compression Stockings Environmental education officer) Signed: 02/28/2022 12:40:42 PM By: Erenest Blank Entered By: Erenest Blank on 02/28/2022 09:06:56 -------------------------------------------------------------------------------- Vitals Details Patient Name: Date of Service: Christian Brown E. 02/28/2022 9:00 A M Medical Record Number: [id number] Patient Account Number: [id number] Date of Birth/Sex: Treating RN: Sep 06, 1965 (56 y.o. M) Primary Care Cody Oliger: [name] Other Clinician: Referring Daeshon Grammatico: Treating Nashaly Dorantes/Extender: [name] Weeks in Treatment: 48 Vital Signs Time Taken: 08:59 Temperature (F): 97.8 Pulse (bpm): 92 Respiratory Rate (breaths/min): 18 Blood Pressure (mmHg): 151/84 Reference Range: 80 - 120 mg / dl Electronic Signature(s) Signed: 02/28/2022 12:40:42 PM By: Erenest Blank Entered By: Erenest Blank on 02/28/2022 08:59:27

## 2022-03-03 ENCOUNTER — Other Ambulatory Visit (HOSPITAL_COMMUNITY): Payer: Self-pay

## 2022-03-04 ENCOUNTER — Encounter (HOSPITAL_BASED_OUTPATIENT_CLINIC_OR_DEPARTMENT_OTHER): Payer: No Typology Code available for payment source | Admitting: Internal Medicine

## 2022-03-04 DIAGNOSIS — L98424 Non-pressure chronic ulcer of back with necrosis of bone: Secondary | ICD-10-CM

## 2022-03-04 DIAGNOSIS — M869 Osteomyelitis, unspecified: Secondary | ICD-10-CM

## 2022-03-04 DIAGNOSIS — E11622 Type 2 diabetes mellitus with other skin ulcer: Secondary | ICD-10-CM | POA: Diagnosis not present

## 2022-03-04 NOTE — Progress Notes (Signed)
DUVALL, COMES ([id number]) [id number] Page 1 of 8 Visit Report for 03/04/2022 Chief Complaint Document Details Patient Name: Date of Service: [name], [name] 03/04/2022 9:15 A M Medical Record Number: [id number] Patient Account Number: [id number] Date of Birth/Sex: Treating RN: Nov 06, 1965 (56 y.o. M) Primary Care Provider: [name] Other Clinician: Referring Provider: Treating Provider/Extender: [name] Weeks in Treatment: 66 Information Obtained from: Patient Chief Complaint 03/28/2021; Back wound Electronic Signature(s) Signed: 03/04/2022 12:46:27 PM By: Kalman Shan DO Entered By: Kalman Shan on 03/04/2022 11:45:45 -------------------------------------------------------------------------------- HPI Details Patient Name: Date of Service: Christian Brown E. 03/04/2022 9:15 A M Medical Record Number: [id number] Patient Account Number: [id number] Date of Birth/Sex: Treating RN: [birth year] (56 y.o. M) Primary Care Provider: [name] Other Clinician: Referring Provider: Treating Provider/Extender: [name] Weeks in Treatment: 60 History of Present Illness HPI Description: Admission 03/28/2021 [name] is a 56 year old male with a past medical history of controlled type 2 diabetes on oral agents, obesity and OSA that presents to the clinic for a back wound. On 01/11/2021 patient had a laminectomy with PLIF of L1-S1 by Dr. Venetia Constable because of lumbar stenosis and radiculopathy. He subsequently developed bacteremia. He had CT imaging on 10/13 of the lumbar spine that showed fluid collection in the soft tissue of the posterior L1 and S1 and was taken to the OR for washout on 10/14. 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 He received 4 weeks of IV antibiotics by infectious disease. After his completion of 4 weeks of IV antibiotics he was continued for an additional 4 weeks of IV  cefazolin with a stop date of 12/29. He has been evaluated by plastic surgery and no plans for surgical intervention at this time. Wife is present and reports he has been on the wound VAC for the past 8 weeks with improvement in wound healing. He currently denies systemic signs of infection. 12/22; patient presents for follow-up. He reports no issues since last clinic visit. He denies signs of infection. He has been tolerating the wound VAC well. 12/30; patient presents for follow-up. He reports no issues and has no complaints today. He has been tolerating the wound VAC well. 1/9; patient presents for follow-up. He has no issues or complaints today. He states he feels well. He has had no problems with the wound VAC. 1/16; patient presents for follow-up. He continues to use the wound VAC with no issues. He denies signs of infection. 1/23; patient presents for follow-up. He has been switched from IV cefazolin to oral cefadroxil by infectious disease. He has no issues or complaints today. He denies signs of infection. He continues to tolerate the wound VAC well. 1/30; patient presents for follow-up. He continues to tolerate the wound VAC well. 2/6; patient presents for follow-up. He has no issues or complaints today. He continues to tolerate the wound VAC well. He denies signs of infection. 2/13; patient presents for follow-up. He continues to do well with the wound VAC. He denies any issues. 2/27; patient presents for follow-up. He continues to use the wound VAC without any issues. He denies signs of infection. 3/20; patient presents for follow-up. He has no issues or complaints today. He continues to use the wound VAC. [name], [name] ([id number]) [id number] Page 2 of 8 4/3; patient presents for follow-up. He continues to use the wound VAC without issues. He denies signs of infection. 4/17; 2-week follow-up. He continues to do well. His measurements  are improved. Initially a  surgical wound complicated by infection. 5/1; patient presents for follow-up. He has no issues or complaints today. He continues to tolerate the wound VAC well. He denies signs of infection. 5/15; patient presents for follow-up. He has noted more maceration to the periwound. He has been using the wound VAC without issues. He currently denies signs of infection. 5/30; patient presents for follow-up. He has been tolerating the wound VAC well over the past 2 weeks. He no longer has maceration to the periwound. He has no issues or complaints today. 6/8; this patient with a postsurgical wound that was complicated by postop infection. He has been using silver collagen under wound VAC and gradually doing well improvement in dimensions especially the tunnel at 12:00 6/22; patient presents for follow-up. We have been using silver collagen under the wound VAC. Patient has no issues or questions today. 7/6; patient presents for follow-up. Patient continues to use collagen under the wound VAC with no issues. 7/20; patient presents for follow-up. He has been using collagen under the wound VAC with no issues. He has been approved for Epicord. This was discussed with the patient and he is in agreement to having this placed at next clinic visit. 7/27; patient presents for follow-up. He has been tolerating the wound VAC well with collagen underneath. He has been approved for epi cord and we have this to place in office today. 8/4; patient presents for follow-up. He had Epicort placed at last clinic visit. We held off on the wound VAC. He tolerated this well. He has no issues or complaints today. 8/11; patient presents for follow-up. Epicord was placed at last clinic visit. He had the wound VAC on for the past week. He states that no drainage was suctioned into the canister. He has slight maceration to the periwound. 8/18; patient presents for follow-up. Epicord #3 was placed in standard fashion last week. The wound  VAC was held. He has no issues or complaints today. 8/25; Patient presents for follow-up. Epicord #4 was placed in standard fashion last week. The wound VAC was held again. He has no issues or complaints today. There has been improvement in wound healing. 9/1; patient presents for follow-up. Epicort #5 was placed in standard fashion last week. He has no issues or complaints today. 9/8; patient presents for follow-up. Epicord #6 was placed in standard fashion last week. He tolerated this well and has no issues or complaints today. 9/15; Epicort No. 7 the wound looks healthy. Perhaps some hyper granulated tissue in the lower 50% of the longitudinal wound 9/22; Epicord #8 placed at last clinic visit. Wound appears well-healing. Patient has no issues or complaints today. 9/28. Epicord No. 9. Continued improvement. 10/6; patient has completed the allotted epicord Applications that insurance would cover. Insurance denied extending more applications. Patient has no issues or complaints today. He tolerated the last application well. 10/13; patient presents for follow-up. We have been using endoform to the wound bed over the past week. He has been approved for 5 more applications of Epicord. 10/20; patient presents for follow-up. Epicort was placed at last clinic visit. He has no issues or complaints today. He denies signs of infection. 10/27; patient presents for follow-up. Epicort was placed in standard fashion at last clinic visit. He has no issues or complaints today. He denies signs of infection. 11/3; Epicort No. 13. Tunneling depth at 12:00 is 2.5 cm as measured by myself 11/10; last patient's approved Epicort #15 was placed in standard fashion  today. [name] is stable however has shown improvement in healing over the past several weeks. He has no issues or complaints today. 11/17; patient presents for follow-up. He has completed his course of approved epi cord dressings. He has improved greatly in  his wound healing. The tunnel is almost completely filled in. He has no issues or complaints today. 11/21; patient presents for follow-up. He has been using endoform to the wound bed. He has no issues or complaints today. Electronic Signature(s) Signed: 03/04/2022 12:46:27 PM By: Kalman Shan DO Entered By: Kalman Shan on 03/04/2022 11:46:18 -------------------------------------------------------------------------------- Physical Exam Details Patient Name: Date of Service: [name], [name] 03/04/2022 9:15 A M Medical Record Number: [id number] Patient Account Number: [id number] Date of Birth/Sex: Treating RN: Jan 05, 1966 (56 y.o. M) Primary Care Provider: [name] Other Clinician: Referring Provider: Treating Provider/Extender: [name] in Treatment: [address] [place] ([id number]) [id number] Page 3 of 8 Constitutional respirations regular, non-labored and within target range for patient.Marland Kitchen Psychiatric pleasant and cooperative. Notes Wound exam; lumbar spine surgical incision site. Tunnel at the 12 o'clock position About 0.5 cm. Inferior to this is a slitlike wound with granulation tissue. Undermining to these edges. No signs of infection. Electronic Signature(s) Signed: 03/04/2022 12:46:27 PM By: Kalman Shan DO Entered By: Kalman Shan on 03/04/2022 11:47:30 -------------------------------------------------------------------------------- Physician Orders Details Patient Name: Date of Service: Christian Brown E. 03/04/2022 9:15 A M Medical Record Number: [id number] Patient Account Number: [id number] Date of Birth/Sex: Treating RN: [birth year] (56 y.o. 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 - Dr. Heber [place] Return A ppointment in 2 weeks. - Friday Dr. Heber  Return A Anesthetic (In clinic) Topical Lidocaine 5% applied to wound bed Cellular or Tissue Based Products Cellular or Tissue Based Product Type: - Epicord #3 applied 11/22/21 Epicord #4 11/29/21 Epicord #5 12/06/2021 Epicord #6 12/13/21 Epicord # 7 12/20/21 Epicord # 8 12/27/21 Epicord # 9 01/03/22 Epicord # 10 01/09/22 Epicord # 11 01/24/22 Epicord # 12 01/31/22 Epicord #13 02/07/2022 Epicord #14 02/14/2022 Epicord #15 02/21/2022 daptic or Mepitel. (DO NOT REMOVE). - leave Cellular or Tissue Based Product applied to wound bed, secured with steri-strips, cover with A the adaptic and steri-strips in place. Bathing/ Shower/ Hygiene May shower with protection but do not get wound dressing(s) wet. Additional Orders / Instructions Follow Nutritious Diet - Continue to monitor blood sugars daily Wound Treatment Wound #1 - Back Wound Laterality: Midline Peri-Wound Care: Skin Prep Every Other Day/30 Days Discharge Instructions: Use skin prep as directed Prim Dressing: Endoform 2x2 in ary Every Other Day/30 Days [name], [name] ([id number]) [id number] Page 4 of 8 Discharge Instructions: Moisten with [place] Jelly lightly pack into tunnel and depth of wound. Secondary Dressing: Woven Gauze Sponge, Non-Sterile 4x4 in Every Other Day/30 Days Discharge Instructions: Apply over primary dressing as directed. Secondary Dressing: Zetuvit Plus Silicone Border Dressing 7x7(in/in) (Generic) Every Other Day/30 Days Discharge Instructions: Apply silicone border over primary dressing as directed. Electronic Signature(s) Signed: 03/04/2022 12:46:27 PM By: Kalman Shan DO Entered By: Kalman Shan on 03/04/2022  11:47:38 -------------------------------------------------------------------------------- Problem List Details Patient Name: Date of Service: Christian Brown E. 03/04/2022 9:15 A M Medical Record Number: [id number]  Patient Account Number: [id number] Date of Birth/Sex: Treating RN: 20-Mar-1966 (56 y.o. 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 03/04/2022 9:15 A M Medical Record Number: [id number] Patient Account Number: [id number] Date of Birth/Sex: Treating RN: [birth year] (56 y.o. M) Primary Care Provider: [name] Other Clinician: Referring Provider: Treating Provider/Extender: [name] in Treatment: [address], [place] ([id number]) [id number] Page 5 of 8 Subjective Chief Complaint Information obtained from Patient 03/28/2021; Back wound History of Present Illness (HPI) Admission 03/28/2021 Mr. Ziere Docken is a 56 year old male with a past medical history of controlled type 2 diabetes on oral agents, obesity and OSA that presents to the clinic for a back wound. On 01/11/2021 patient had a laminectomy with PLIF of L1-S1 by Dr. Venetia Constable because of lumbar stenosis and radiculopathy. He subsequently developed bacteremia.  He had CT imaging on 10/13 of the lumbar spine that showed fluid collection in the soft tissue of the posterior L1 and S1 and was taken to the OR for washout on 10/14. 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 He received 4 weeks of IV antibiotics by infectious disease. After his completion of 4 weeks of IV antibiotics he was continued for an additional 4 weeks of IV cefazolin with a stop date of 12/29. He has been evaluated by plastic surgery and no plans for surgical intervention at this time. Wife is present and reports he has been on the wound VAC for the past 8 weeks with improvement in wound healing. He currently denies systemic signs of infection. 12/22; patient presents for follow-up. He reports no issues since last clinic visit. He denies signs of infection. He has been tolerating the wound VAC well. 12/30; patient presents for follow-up. He reports no issues and has no complaints today. He has been tolerating the wound VAC well. 1/9; patient presents for follow-up. He has no issues or complaints today. He states he feels well. He has had no problems with the wound VAC. 1/16; patient presents for follow-up. He continues to use the wound VAC with no issues. He denies signs of infection. 1/23; patient presents for follow-up. He has been switched from IV cefazolin to oral cefadroxil by infectious disease. He has no issues or complaints today. He denies signs of infection. He continues to tolerate the wound VAC well. 1/30; patient presents for follow-up. He continues to tolerate the wound VAC well. 2/6; patient presents for follow-up. He has no issues or complaints today. He continues to tolerate the wound VAC well. He denies signs of infection. 2/13; patient presents for follow-up. He continues to do well with the wound VAC. He denies any issues. 2/27; patient presents for follow-up. He continues to use the wound VAC without any issues. He denies signs of  infection. 3/20; patient presents for follow-up. He has no issues or complaints today. He continues to use the wound VAC. 4/3; patient presents for follow-up. He continues to use the wound VAC without issues.  He denies signs of infection. 4/17; 2-week follow-up. He continues to do well. His measurements are improved. Initially a surgical wound complicated by infection. 5/1; patient presents for follow-up. He has no issues or complaints today. He continues to tolerate the wound VAC well. He denies signs of infection. 5/15; patient presents for follow-up. He has noted more maceration to the periwound. He has been using the wound VAC without issues. He currently denies signs of infection. 5/30; patient presents for follow-up. He has been tolerating the wound VAC well over the past 2 weeks. He no longer has maceration to the periwound. He has no issues or complaints today. 6/8; this patient with a postsurgical wound that was complicated by postop infection. He has been using silver collagen under wound VAC and gradually doing well improvement in dimensions especially the tunnel at 12:00 6/22; patient presents for follow-up. We have been using silver collagen under the wound VAC. Patient has no issues or questions today. 7/6; patient presents for follow-up. Patient continues to use collagen under the wound VAC with no issues. 7/20; patient presents for follow-up. He has been using collagen under the wound VAC with no issues. He has been approved for Epicord. This was discussed with the patient and he is in agreement to having this placed at next clinic visit. 7/27; patient presents for follow-up. He has been tolerating the wound VAC well with collagen underneath. He has been approved for epi cord and we have this to place in office today. 8/4; patient presents for follow-up. He had Epicort placed at last clinic visit. We held off on the wound VAC. He tolerated this well. He has no issues or complaints  today. 8/11; patient presents for follow-up. Epicord was placed at last clinic visit. He had the wound VAC on for the past week. He states that no drainage was suctioned into the canister. He has slight maceration to the periwound. 8/18; patient presents for follow-up. Epicord #3 was placed in standard fashion last week. The wound VAC was held. He has no issues or complaints today. 8/25; Patient presents for follow-up. Epicord #4 was placed in standard fashion last week. The wound VAC was held again. He has no issues or complaints today. There has been improvement in wound healing. 9/1; patient presents for follow-up. Epicort #5 was placed in standard fashion last week. He has no issues or complaints today. 9/8; patient presents for follow-up. Epicord #6 was placed in standard fashion last week. He tolerated this well and has no issues or complaints today. 9/15; Epicort No. 7 the wound looks healthy. Perhaps some hyper granulated tissue in the lower 50% of the longitudinal wound 9/22; Epicord #8 placed at last clinic visit. Wound appears well-healing. Patient has no issues or complaints today. 9/28. Epicord No. 9. Continued improvement. 10/6; patient has completed the allotted epicord Applications that insurance would cover. Insurance denied extending more applications. Patient has no issues or complaints today. He tolerated the last application well. 10/13; patient presents for follow-up. We have been using endoform to the wound bed over the past week. He has been approved for 5 more applications of Epicord. [name], [name] ([id number]) [id number] Page 6 of 8 10/20; patient presents for follow-up. Epicort was placed at last clinic visit. He has no issues or complaints today. He denies signs of infection. 10/27; patient presents for follow-up. Epicort was placed in standard fashion at last clinic visit. He has no issues or complaints today. He denies signs  of infection. 11/3; Epicort  No. 13. Tunneling depth at 12:00 is 2.5 cm as measured by myself 11/10; last patient's approved Epicort #15 was placed in standard fashion today. Tunneling is stable however has shown improvement in healing over the past several weeks. He has no issues or complaints today. 11/17; patient presents for follow-up. He has completed his course of approved epi cord dressings. He has improved greatly in his wound healing. The tunnel is almost completely filled in. He has no issues or complaints today. 11/21; patient presents for follow-up. He has been using endoform to the wound bed. He has no issues or complaints today. Patient History Information obtained from Patient. Family History Diabetes - Mother, Stroke - Siblings, No family history of Cancer, Heart Disease, Hereditary Spherocytosis, Hypertension, Kidney Disease, Lung Disease, Seizures, Thyroid Problems, Tuberculosis. Social History Never smoker, Marital Status - Married, Alcohol Use - Rarely, Drug Use - No History, Caffeine Use - Rarely. Medical History Cardiovascular Patient has history of Hypertension Endocrine Patient has history of Type II Diabetes Musculoskeletal Patient has history of Osteomyelitis Medical A Surgical History Notes nd Musculoskeletal DDD Objective Constitutional respirations regular, non-labored and within target range for patient.. Vitals Time Taken: 10:46 AM, Temperature: 98.0 F, Pulse: 70 bpm, Respiratory Rate: 18 breaths/min, Blood Pressure: 115/72 mmHg. Psychiatric pleasant and cooperative. General Notes: Wound exam; lumbar spine surgical incision site. Tunnel at the 12 o'clock position About 0.5 cm. Inferior to this is a slitlike wound with granulation tissue. Undermining to these edges. No signs of infection. Integumentary (Hair, Skin) Wound #1 status is Open. Original cause of wound was Surgical Injury. The date acquired was: 01/11/2021. The wound has been in treatment  48 weeks. The wound is located on the Midline Back. The wound measures 3.8cm length x 0.3cm width x 0.2cm depth; 0.895cm^2 area and 0.179cm^3 volume. There is Fat Layer (Subcutaneous Tissue) exposed. There is no undermining noted, however, there is tunneling at 12:00 with a maximum distance of 1.6cm. There is a medium amount of serosanguineous drainage noted. The wound margin is distinct with the outline attached to the wound base. There is large (67-100%) pink granulation within the wound bed. There is a small (1-33%) amount of necrotic tissue within the wound bed including Adherent Slough. The periwound skin appearance did not exhibit: Callus, Crepitus, Excoriation, Induration, Rash, Scarring, Dry/Scaly, Maceration, Atrophie Blanche, Cyanosis, Ecchymosis, Hemosiderin Staining, Mottled, Pallor, Rubor, Erythema. Periwound temperature was noted as No Abnormality. 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 No signs of infection. I recommended continuing with endoform. Aggressively offload the area. Follow-up in 1 week. Plan Follow-up Appointments: Return Appointment in 1 week. - Dr. Heber [place] Return Appointment in 2 weeks. - Friday Dr. Heber [place] Anesthetic: (In clinic) Topical Lidocaine 5% applied to wound bed Cellular or Tissue Based Products: Cellular or Tissue Based Product Type: - Epicord #3 applied 11/22/21 Epicord #4 11/29/21 Epicord #5 12/06/2021 Epicord #6 12/13/21 Epicord # 7 12/20/21 Epicord # 8 12/27/21 Epicord # 9 01/03/22 Epicord # 10 01/09/22 Epicord # 11 01/24/22 Epicord # 12 01/31/22 Epicord #13 02/07/2022 Epicord #14 02/14/2022 Epicord #15 02/21/2022 Cellular or Tissue Based Product applied to wound bed, secured with steri-strips, cover with Adaptic or Mepitel.  (DO NOT REMOVE). - leave the adaptic and steri-strips in place. Bathing/ Shower/ Hygiene: May shower with protection but do not get wound dressing(s) wet. Additional Orders /  Instructions: Follow Nutritious Diet - Continue to monitor blood sugars daily WOUND #1: - Back Wound Laterality: Midline Peri-Wound Care: Skin Prep Every Other Day/30 Days Discharge Instructions: Use skin prep as directed Prim Dressing: Endoform 2x2 in Every Other Day/30 Days ary Discharge Instructions: Moisten with [place] Jelly lightly pack into tunnel and depth of wound. Secondary Dressing: Woven Gauze Sponge, Non-Sterile 4x4 in Every Other Day/30 Days Discharge Instructions: Apply over primary dressing as directed. Secondary Dressing: Zetuvit Plus Silicone Border Dressing 7x7(in/in) (Generic) Every Other Day/30 Days Discharge Instructions: Apply silicone border over primary dressing as directed. 1. Endoform 2. Follow-up in 1 week Electronic Signature(s) Signed: 03/04/2022 12:46:27 PM By: Kalman Shan DO Entered By: Kalman Shan on 03/04/2022 11:48:24 -------------------------------------------------------------------------------- HxROS Details Patient Name: Date of Service: Christian Brown E. 03/04/2022 9:15 A M Medical Record Number: [id number] Patient Account Number: [id number] Date of Birth/Sex: Treating RN: [birth year] (56 y.o. M) Primary Care Provider: [name] Other Clinician: Referring Provider: Treating Provider/Extender: [name] Weeks in Treatment: 48 Information Obtained From Patient Cardiovascular Medical History: Positive for: Hypertension Endocrine Medical History: Positive for: Type II Diabetes Time with diabetes: Since mid 90's Treated with: Oral agents Blood sugar tested every day: Yes Tested : 2x day Musculoskeletal Medical History: Positive for: Osteomyelitis Past Medical History Notes: DDD [name], [name] ([id number]) [id number]  Page 8 of 8 Immunizations Pneumococcal Vaccine: Received Pneumococcal Vaccination: Yes Received Pneumococcal Vaccination On or After 60th Birthday: No Implantable Devices Yes Family and Social History Cancer: No; Diabetes: Yes - Mother; Heart Disease: No; Hereditary Spherocytosis: No; Hypertension: No; Kidney Disease: No; Lung Disease: No; Seizures: No; Stroke: Yes - Siblings; Thyroid Problems: No; Tuberculosis: No; Never smoker; Marital Status - Married; Alcohol Use: Rarely; Drug Use: No History; Caffeine Use: Rarely; Financial Concerns: No; Food, Clothing or Shelter Needs: No; Support System Lacking: No; Transportation Concerns: No Electronic Signature(s) Signed: 03/04/2022 12:46:27 PM By: Kalman Shan DO Entered By: Kalman Shan on 03/04/2022 11:46:24 -------------------------------------------------------------------------------- SuperBill Details Patient Name: Date of Service: Christian Grout. 03/04/2022 Medical Record Number: [id number] Patient Account Number: [id number] Date of Birth/Sex: Treating RN: Aug 26, 1965 (56 y.o. 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 M86.9 Osteomyelitis, unspecified E11.622 Type 2 diabetes mellitus with other skin ulcer Facility Procedures : CPT4 Code: [id number] Description: 99213 - WOUND CARE VISIT-LEV 3 EST PT Modifier: Quantity: 1 Physician Procedures : CPT4 Code Description Modifier [id number] - WC PHYS LEVEL 3 - EST PT ICD-10 Diagnosis Description L98.424 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

## 2022-03-05 NOTE — Progress Notes (Signed)
SHADI, SESSLER ([id number]) [id number] Page 1 of 9 Visit Report for 03/04/2022 Arrival Information Details Patient Name: Date of Service: [name], [name] 03/04/2022 9:15 A M Medical Record Number: [id number] Patient Account Number: [id number] Date of Birth/Sex: Treating RN: 15-Feb-1966 (56 y.o. [name] Primary Care Kimoni Pickerill: [name] Other Clinician: Referring Lekha Dancer: Treating Tanairy Payeur/Extender: [name] in Treatment: 40 Visit Information History Since Last Visit Added or deleted any medications: No Patient Arrived: Ambulatory Any new allergies or adverse reactions: No Arrival Time: 10:45 Had a fall or experienced change in No Accompanied By: daughter activities of daily living that may affect Transfer Assistance: None risk of falls: Patient Requires Transmission-Based Precautions: No Signs or symptoms of abuse/neglect since last visito No Patient Has Alerts: Yes Hospitalized since last visit: No Patient Alerts: Patient on Blood Thinner Implantable device outside of the clinic excluding No No BP Right Arm-PICC cellular tissue based products placed in the center since last visit: Has Dressing in Place as Prescribed: Yes Pain Present Now: No Electronic Signature(s) Signed: 03/04/2022 11:31:50 AM By: Sharyn Creamer RN, BSN Entered By: Sharyn Christian on 03/04/2022 10:46:47 -------------------------------------------------------------------------------- Clinic Level of Care Assessment Details Patient Name: Date of Service: [name], [name] 03/04/2022 9:15 A M Medical Record Number: [id number] Patient Account Number: [id number] Date of Birth/Sex: Treating RN: 1966/03/10 (56 y.o. [name] Primary Care Yaneliz Radebaugh: [name] Other Clinician: Referring Amzie Sillas: Treating Amilee Janvier/Extender: [name] Weeks in Treatment: 40 Clinic Level of Care Assessment Items TOOL 4 Quantity Score X- 1 0 Use when  only an EandM is performed on FOLLOW-UP visit ASSESSMENTS - Nursing Assessment / Reassessment X- 1 10 Reassessment of Co-morbidities (includes updates in patient status) X- 1 5 Reassessment of Adherence to Treatment Plan ASSESSMENTS - Wound and Skin A ssessment / Reassessment X - Simple Wound Assessment / Reassessment - one wound 1 5 _0  - 0 Complex Wound Assessment / Reassessment - multiple wounds _1  - 0 Dermatologic / Skin Assessment (not related to wound area) ASSESSMENTS - Focused Assessment _2  - 0 Circumferential Edema Measurements - multi extremities _3  - 0 Nutritional Assessment / Counseling / Intervention [name], [name] ([id number]) [id number] Page 2 of 9 _4  - 0 Lower Extremity Assessment (monofilament, tuning fork, pulses) _5  - 0 Peripheral Arterial Disease Assessment (using hand held doppler) ASSESSMENTS - Ostomy and/or Continence Assessment and Care _6  - 0 Incontinence Assessment and Management _7  - 0 Ostomy Care Assessment and Management (repouching, etc.) PROCESS - Coordination of Care X - Simple Patient / Family Education for ongoing care 1 15 _8  - 0 Complex (extensive) Patient / Family Education for ongoing care X- 1 10 Staff obtains Programmer, systems, Records, T Results / Process Orders est _9  - 0 Staff telephones HHA, Nursing Homes / Clarify orders / etc _10  - 0 Routine Transfer to another Facility (non-emergent condition) _11  - 0 Routine Hospital Admission (non-emergent condition) _12  - 0 New Admissions / Biomedical engineer / Ordering NPWT Apligraf, etc. , _13  - 0 Emergency Hospital Admission (emergent condition) X- 1 10 Simple Discharge Coordination _14  - 0 Complex (extensive) Discharge Coordination PROCESS - Special Needs _15  - 0 Pediatric / Minor Patient Management _16  - 0 Isolation Patient Management _17  - 0 Hearing / Language / Visual special needs _18  - 0 Assessment of Community assistance (transportation, D/C planning,  etc.) _19  - 0 Additional assistance / Altered mentation _20  - 0 Support Surface(s) Assessment (bed, cushion, seat, etc.) INTERVENTIONS - Wound Cleansing / Measurement X -  Simple Wound Cleansing - one wound 1 5 _0  - 0 Complex Wound Cleansing - multiple wounds X- 1 5 Wound Imaging (photographs - any number of wounds) _1  - 0 Wound Tracing (instead of photographs) X- 1 5 Simple Wound Measurement - one wound _2  - 0 Complex Wound Measurement - multiple wounds INTERVENTIONS - Wound Dressings X - Small Wound Dressing one or multiple wounds 1 10 _3  - 0 Medium Wound Dressing one or multiple wounds _4  - 0 Large Wound Dressing one or multiple wounds <VQMGQQPYPPJKDTOI>_7<\/TIWPYKDXIPJASNKN>_3  - 0 Application of Medications - topical <ZJQBHALPFXTKWIOX>_7<\/DZHGDJMEQASTMHDQ>_2  - 0 Application of Medications - injection INTERVENTIONS - Miscellaneous _7  - 0 External ear exam _8  - 0 Specimen Collection (cultures, biopsies, blood, body fluids, etc.) _9  - 0 Specimen(s) / Culture(s) sent or taken to Lab for analysis _10  - 0 Patient Transfer (multiple staff / Civil Service fast streamer / Similar devices) _11  - 0 Simple Staple / Suture removal (25 or less) _12  - 0 Complex Staple / Suture removal (26 or more) _13  - 0 Hypo / Hyperglycemic Management (close monitor of Blood Glucose) [name], [name] ([id number]) [id number] Page 3 of 9 _14  - 0 Ankle / Brachial Index (ABI) - do not check if billed separately X- 1 5 Vital Signs Has the patient been seen at the hospital within the last three years: Yes Total Score: 85 Level Of Care: New/Established - Level 3 Electronic Signature(s) Signed: 03/05/2022 10:43:44 AM By: Deon Pilling RN, BSN Entered By: Deon Pilling on 03/04/2022 11:36:08 -------------------------------------------------------------------------------- Encounter Discharge Information Details Patient Name: Date of Service: Christian Brown Murphy. 03/04/2022 9:15 A M Medical Record Number: [id number] Patient Account Number: [id number] Date of Birth/Sex: Treating  RN: Jun 08, 1965 (56 y.o. [name] Primary Care Waldon Sheerin: [name] Other Clinician: Referring Herrick Hartog: Treating Cederic Mozley/Extender: [name] Weeks in Treatment: 48 Encounter Discharge Information Items Discharge Condition: Stable Ambulatory Status: Ambulatory Discharge Destination: Home Transportation: Private Auto Accompanied By: family member Schedule Follow-up Appointment: Yes Clinical Summary of Care: Electronic Signature(s) Signed: 03/05/2022 10:43:44 AM By: Deon Pilling RN, BSN Entered By: Deon Pilling on 03/04/2022 11:36:39 -------------------------------------------------------------------------------- Lower Extremity Assessment Details Patient Name: Date of Service: Christian Brown Murphy. 03/04/2022 9:15 A M Medical Record Number: [id number] Patient Account Number: [id number] Date of Birth/Sex: Treating RN: Jun 05, 1965 (56 y.o. [name] Primary Care Levoy Geisen: [name] Other Clinician: Referring Rihana Kiddy: Treating Cristina Mattern/Extender: [name] Weeks in Treatment: 48 Electronic Signature(s) Signed: 03/04/2022 11:31:50 AM By: Sharyn Creamer RN, BSN Entered By: Sharyn Christian on 03/04/2022 10:48:08 -------------------------------------------------------------------------------- Multi Wound Chart Details Patient Name: Date of Service: Christian Brown Murphy. 03/04/2022 9:15 A M Medical Record Number: [id number] Patient Account Number: [id number] [name], [name] ([id number]) [id number] Page 4 of 9 Date of Birth/Sex: Treating RN: [birth year] (56 y.o. M) Primary Care Sayf Kerner: Other Clinician: [name] Referring Hero Kulish: Treating Everleigh Colclasure/Extender: [name] Weeks in Treatment: 61 Vital Signs Height(in): Pulse(bpm): 70 Weight(lbs): Blood Pressure(mmHg): 115/72 Body Mass Index(BMI): Temperature(F): 98.0 Respiratory Rate(breaths/min): 18 [1:Photos:] [N/A:N/A] Midline Back N/A N/A Wound  Location: Surgical Injury N/A N/A Wounding Event: Open Surgical Wound N/A N/A Primary Etiology: Hypertension, Type II Diabetes, N/A N/A Comorbid History: Osteomyelitis 01/11/2021 N/A N/A Date Acquired: 48 N/A N/A Weeks of Treatment: Open N/A N/A Wound Status: No N/A N/A Wound Recurrence: 3.8x0.3x0.2 N/A N/A Measurements L x W x D (cm) 0.895 N/A N/A A (cm) : rea 0.179 N/A N/A Volume (cm) : 98.80% N/A N/A % Reduction in A rea: 99.90% N/A N/A %  Reduction in Volume: 12 Position 1 (o'clock): 1.6 Maximum Distance 1 (cm): Yes N/A N/A Tunneling: Full Thickness With Exposed Support N/A N/A Classification: Structures Medium N/A N/A Exudate Amount: Serosanguineous N/A N/A Exudate Type: red, brown N/A N/A Exudate Color: Distinct, outline attached N/A N/A Wound Margin: Large (67-100%) N/A N/A Granulation Amount: Pink N/A N/A Granulation Quality: Small (1-33%) N/A N/A Necrotic Amount: Fat Layer (Subcutaneous Tissue): Yes N/A N/A Exposed Structures: Fascia: No Tendon: No Muscle: No Joint: No Bone: No Large (67-100%) N/A N/A Epithelialization: Excoriation: No N/A N/A Periwound Skin Texture: Induration: No Callus: No Crepitus: No Rash: No Scarring: No Maceration: No N/A N/A Periwound Skin Moisture: Dry/Scaly: No Atrophie Blanche: No N/A N/A Periwound Skin Color: Cyanosis: No Ecchymosis: No Erythema: No Hemosiderin Staining: No Mottled: No Pallor: No Rubor: No No Abnormality N/A N/A Temperature: Treatment Notes Wound #1 (Back) Wound Laterality: Midline Cleanser Peri-Wound Care JAMAI, DOLCE ([id number]) [id number] Page 5 of 9 Skin Prep Discharge Instruction: Use skin prep as directed Topical Primary Dressing Endoform 2x2 in Discharge Instruction: Moisten with [place] Jelly lightly pack into tunnel and depth of wound. Secondary Dressing Woven Gauze Sponge, Non-Sterile 4x4 in Discharge Instruction: Apply over primary  dressing as directed. Zetuvit Plus Silicone Border Dressing 7x7(in/in) Discharge Instruction: Apply silicone border over primary dressing as directed. Secured With Compression Wrap Compression Stockings Add-Ons Electronic Signature(s) Signed: 03/04/2022 12:46:27 PM By: Kalman Shan DO Entered By: Kalman Shan on 03/04/2022 11:45:36 -------------------------------------------------------------------------------- Multi-Disciplinary Care Plan Details Patient Name: Date of Service: Christian Brown Murphy. 03/04/2022 9:15 A M Medical Record Number: [id number] Patient Account Number: [id number] Date of Birth/Sex: Treating RN: 12-19-1965 (56 y.o. [name] Primary Care Gilda Abboud: [name] Other Clinician: Referring French Kendra: Treating Wynn Kernes/Extender: [name] Weeks in Treatment: Slaughter reviewed with physician Active Inactive Wound/Skin Impairment Nursing Diagnoses: Impaired tissue integrity Goals: Patient/caregiver will verbalize understanding of skin care regimen Date Initiated: 03/28/2021 Target Resolution Date: 05/16/2022 Goal Status: Active Ulcer/skin breakdown will have a volume reduction of 30% by week 4 Date Initiated: 03/28/2021 Date Inactivated: 05/27/2021 Target Resolution Date: 05/31/2021 Goal Status: Met Interventions: Assess patient/caregiver ability to obtain necessary supplies Assess patient/caregiver ability to perform ulcer/skin care regimen upon admission and as needed Assess ulceration(s) every visit Provide education on ulcer and skin care Treatment Activities: Topical wound management initiated : 03/28/2021 Notes: Electronic Signature(s) [name], [name] ([id number]) [id number] Page 6 of 9 Signed: 03/05/2022 10:43:44 AM By: Deon Pilling RN, BSN Entered By: Deon Pilling on 03/04/2022 11:23:11 -------------------------------------------------------------------------------- Pain  Assessment Details Patient Name: Date of Service: [name], [name]. 03/04/2022 9:15 A M Medical Record Number: [id number] Patient Account Number: [id number] Date of Birth/Sex: Treating RN: 15-Nov-1965 (56 y.o. [name] Primary Care Milisa Kimbell: [name] Other Clinician: Referring Tanyon Alipio: Treating Ahni Bradwell/Extender: [name] Weeks in Treatment: 48 Active Problems Location of Pain Severity and Description of Pain Patient Has Paino No Site Locations Pain Management and Medication Current Pain Management: Electronic Signature(s) Signed: 03/04/2022 11:31:50 AM By: Sharyn Creamer RN, BSN Entered By: Sharyn Christian on 03/04/2022 10:48:01 -------------------------------------------------------------------------------- Patient/Caregiver Education Details Patient Name: Date of Service: [name] 11/21/2023andnbsp9:15 A M Medical Record Number: [id number] Patient Account Number: [id number] Date of Birth/Gender: Treating RN: 11-01-1965 (56 y.o. [name] Primary Care Physician: [name] Other Clinician: Referring Physician: Treating Physician/Extender: [name] in Treatment: [phone] Education Assessment Education Provided To: Patient [name], [name] ([id number]) [phone].pdf Page 7 of 9 Education Topics  Provided Wound/Skin Impairment: Handouts: Skin Care Do's and Dont's Methods: Explain/Verbal Responses: Reinforcements needed Electronic Signature(s) Signed: 03/05/2022 10:43:44 AM By: Deon Pilling RN, BSN Entered By: Deon Pilling on 03/04/2022 11:23:26 -------------------------------------------------------------------------------- Wound Assessment Details Patient Name: Date of Service: Christian Brown Murphy. 03/04/2022 9:15 A M Medical Record Number: [id number] Patient Account Number: [id number] Date of Birth/Sex: Treating RN: Mar 18, 1966 (56 y.o. [name] Primary Care Delphin Funes: [name] Other  Clinician: Referring Talis Iwan: Treating Kaziah Krizek/Extender: [name] Weeks in Treatment: 48 Wound Status Wound Number: 1 Primary Etiology: Open Surgical Wound Wound Location: Midline Back Wound Status: Open Wounding Event: Surgical Injury Comorbid History: Hypertension, Type II Diabetes, Osteomyelitis Date Acquired: 01/11/2021 Weeks Of Treatment: 48 Clustered Wound: No Photos Wound Measurements Length: (cm) 3.8 Width: (cm) 0.3 Depth: (cm) 0.2 Area: (cm) 0.895 Volume: (cm) 0.179 % Reduction in Area: 98.8% % Reduction in Volume: 99.9% Epithelialization: Large (67-100%) Tunneling: Yes Position (o'clock): 12 Maximum Distance: (cm) 1.6 Undermining: No Wound Description Classification: Full Thickness With Exposed Support Structures Wound Margin: Distinct, outline attached Exudate Amount: Medium Exudate Type: Serosanguineous Exudate Color: red, brown Foul Odor After Cleansing: No Slough/Fibrino Yes Wound Bed Granulation Amount: Large (67-100%) Exposed Structure Granulation Quality: Pink Fascia Exposed: No Necrotic Amount: Small (1-33%) Fat Layer (Subcutaneous Tissue) Exposed: Yes Necrotic Quality: Adherent Slough Tendon Exposed: No Muscle Exposed: No [name], [name] ([id number]) [id number] Page 8 of 9 Joint Exposed: No Bone Exposed: No Periwound Skin Texture Texture Color No Abnormalities Noted: No No Abnormalities Noted: No Callus: No Atrophie Blanche: No Crepitus: No Cyanosis: No Excoriation: No Ecchymosis: No Induration: No Erythema: No Rash: No Hemosiderin Staining: No Scarring: No Mottled: No Pallor: No Moisture Rubor: No No Abnormalities Noted: No Dry / Scaly: No Temperature / Pain Maceration: No Temperature: No Abnormality Treatment Notes Wound #1 (Back) Wound Laterality: Midline Cleanser Peri-Wound Care Skin Prep Discharge Instruction: Use skin prep as directed Topical Primary Dressing Endoform  2x2 in Discharge Instruction: Moisten with [place] Jelly lightly pack into tunnel and depth of wound. Secondary Dressing Woven Gauze Sponge, Non-Sterile 4x4 in Discharge Instruction: Apply over primary dressing as directed. Zetuvit Plus Silicone Border Dressing 7x7(in/in) Discharge Instruction: Apply silicone border over primary dressing as directed. Secured With Compression Wrap Compression Stockings Environmental education officer) Signed: 03/04/2022 11:31:50 AM By: Sharyn Creamer RN, BSN Entered By: Sharyn Christian on 03/04/2022 10:54:29 -------------------------------------------------------------------------------- Vitals Details Patient Name: Date of Service: Christian Brown Murphy. 03/04/2022 9:15 A M Medical Record Number: [id number] Patient Account Number: [id number] Date of Birth/Sex: Treating RN: [birth year] (56 y.o. [name] Primary Care Marvena Tally: [name] Other Clinician: Referring Denya Buckingham: Treating Kamisha Ell/Extender: [name] Weeks in Treatment: 48 Vital Signs Time Taken: 10:46 Temperature (F): 98.0 Pulse (bpm): 70 Respiratory Rate (breaths/min): 18 Blood Pressure (mmHg): 115/72 Reference Range: 80 - 120 mg / dl [name], [name] ([id number]) [id number] Page 9 of 9 Electronic Signature(s) Signed: 03/04/2022 11:31:50 AM By: Sharyn Creamer RN, BSN Entered By: Sharyn Christian on 03/04/2022 10:47:55

## 2022-03-10 ENCOUNTER — Other Ambulatory Visit (HOSPITAL_COMMUNITY): Payer: Self-pay

## 2022-03-10 ENCOUNTER — Other Ambulatory Visit: Payer: Self-pay | Admitting: Physical Medicine and Rehabilitation

## 2022-03-10 MED ORDER — MORPHINE SULFATE ER 15 MG PO TBCR
30.0000 mg | EXTENDED_RELEASE_TABLET | Freq: Two times a day (BID) | ORAL | 0 refills | Status: DC
  Filled 2022-03-10 – 2022-03-26 (×2): qty 120, 30d supply, fill #0

## 2022-03-12 ENCOUNTER — Encounter: Payer: No Typology Code available for payment source | Admitting: Physical Medicine and Rehabilitation

## 2022-03-14 ENCOUNTER — Encounter (HOSPITAL_BASED_OUTPATIENT_CLINIC_OR_DEPARTMENT_OTHER): Payer: No Typology Code available for payment source | Attending: Internal Medicine | Admitting: Internal Medicine

## 2022-03-14 DIAGNOSIS — L98494 Non-pressure chronic ulcer of skin of other sites with necrosis of bone: Secondary | ICD-10-CM | POA: Diagnosis not present

## 2022-03-14 DIAGNOSIS — G4733 Obstructive sleep apnea (adult) (pediatric): Secondary | ICD-10-CM | POA: Diagnosis not present

## 2022-03-14 DIAGNOSIS — M869 Osteomyelitis, unspecified: Secondary | ICD-10-CM | POA: Insufficient documentation

## 2022-03-14 DIAGNOSIS — E1169 Type 2 diabetes mellitus with other specified complication: Secondary | ICD-10-CM | POA: Diagnosis not present

## 2022-03-14 DIAGNOSIS — L98424 Non-pressure chronic ulcer of back with necrosis of bone: Secondary | ICD-10-CM

## 2022-03-14 DIAGNOSIS — E11622 Type 2 diabetes mellitus with other skin ulcer: Secondary | ICD-10-CM | POA: Diagnosis not present

## 2022-03-14 DIAGNOSIS — E669 Obesity, unspecified: Secondary | ICD-10-CM | POA: Diagnosis not present

## 2022-03-14 NOTE — Progress Notes (Signed)
ALFREDO, SPONG ([id number]) [id number] Page 1 of 8 Visit Report for 03/14/2022 Chief Complaint Document Details Patient Name: Date of Service: [name], [name] 03/14/2022 9:15 A M Medical Record Number: [id number] Patient Account Number: [id number] Date of Birth/Sex: Treating RN: 10-Jun-1965 (56 y.o. M) Primary Care Provider: [name] Other Clinician: Referring Provider: Treating Provider/Extender: [name] Weeks in Treatment: 95 Information Obtained from: Patient Chief Complaint 03/28/2021; Back wound Electronic Signature(s) Signed: 03/14/2022 9:30:31 AM By: Kalman Shan DO Entered By: Kalman Shan on 03/14/2022 09:27:22 -------------------------------------------------------------------------------- HPI Details Patient Name: Date of Service: Christian Brown E. 03/14/2022 9:15 A M Medical Record Number: [id number] Patient Account Number: [id number] Date of Birth/Sex: Treating RN: 12-06-1965 (56 y.o. M) Primary Care Provider: [name] Other Clinician: Referring Provider: Treating Provider/Extender: [name] Weeks in Treatment: 50 History of Present Illness HPI Description: Admission 03/28/2021 Mr. Davidjames Blansett is a 56 year old male with a past medical history of controlled type 2 diabetes on oral agents, obesity and OSA that presents to the clinic for a back wound. On 01/11/2021 patient had a laminectomy with PLIF of L1-S1 by Dr. Venetia Constable because of lumbar stenosis and radiculopathy. He subsequently developed bacteremia. He had CT imaging on 10/13 of the lumbar spine that showed fluid collection in the soft tissue of the posterior L1 and S1 and was taken to the OR for washout on 10/14. 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 He received 4 weeks of IV antibiotics by infectious disease. After his completion of 4 weeks of IV antibiotics he was continued for an additional 4 weeks of IV cefazolin  with a stop date of 12/29. He has been evaluated by plastic surgery and no plans for surgical intervention at this time. Wife is present and reports he has been on the wound VAC for the past 8 weeks with improvement in wound healing. He currently denies systemic signs of infection. 12/22; patient presents for follow-up. He reports no issues since last clinic visit. He denies signs of infection. He has been tolerating the wound VAC well. 12/30; patient presents for follow-up. He reports no issues and has no complaints today. He has been tolerating the wound VAC well. 1/9; patient presents for follow-up. He has no issues or complaints today. He states he feels well. He has had no problems with the wound VAC. 1/16; patient presents for follow-up. He continues to use the wound VAC with no issues. He denies signs of infection. 1/23; patient presents for follow-up. He has been switched from IV cefazolin to oral cefadroxil by infectious disease. He has no issues or complaints today. He denies signs of infection. He continues to tolerate the wound VAC well. 1/30; patient presents for follow-up. He continues to tolerate the wound VAC well. 2/6; patient presents for follow-up. He has no issues or complaints today. He continues to tolerate the wound VAC well. He denies signs of infection. 2/13; patient presents for follow-up. He continues to do well with the wound VAC. He denies any issues. 2/27; patient presents for follow-up. He continues to use the wound VAC without any issues. He denies signs of infection. 3/20; patient presents for follow-up. He has no issues or complaints today. He continues to use the wound VAC. KAMRIN, SIBLEY ([id number]) [id number] Page 2 of 8 4/3; patient presents for follow-up. He continues to use the wound VAC without issues. He denies signs of infection. 4/17; 2-week follow-up. He continues to do well. His measurements  are improved. Initially a surgical  wound complicated by infection. 5/1; patient presents for follow-up. He has no issues or complaints today. He continues to tolerate the wound VAC well. He denies signs of infection. 5/15; patient presents for follow-up. He has noted more maceration to the periwound. He has been using the wound VAC without issues. He currently denies signs of infection. 5/30; patient presents for follow-up. He has been tolerating the wound VAC well over the past 2 weeks. He no longer has maceration to the periwound. He has no issues or complaints today. 6/8; this patient with a postsurgical wound that was complicated by postop infection. He has been using silver collagen under wound VAC and gradually doing well improvement in dimensions especially the tunnel at 12:00 6/22; patient presents for follow-up. We have been using silver collagen under the wound VAC. Patient has no issues or questions today. 7/6; patient presents for follow-up. Patient continues to use collagen under the wound VAC with no issues. 7/20; patient presents for follow-up. He has been using collagen under the wound VAC with no issues. He has been approved for Epicord. This was discussed with the patient and he is in agreement to having this placed at next clinic visit. 7/27; patient presents for follow-up. He has been tolerating the wound VAC well with collagen underneath. He has been approved for epi cord and we have this to place in office today. 8/4; patient presents for follow-up. He had Epicort placed at last clinic visit. We held off on the wound VAC. He tolerated this well. He has no issues or complaints today. 8/11; patient presents for follow-up. Epicord was placed at last clinic visit. He had the wound VAC on for the past week. He states that no drainage was suctioned into the canister. He has slight maceration to the periwound. 8/18; patient presents for follow-up. Epicord #3 was placed in standard fashion last week. The wound VAC was  held. He has no issues or complaints today. 8/25; Patient presents for follow-up. Epicord #4 was placed in standard fashion last week. The wound VAC was held again. He has no issues or complaints today. There has been improvement in wound healing. 9/1; patient presents for follow-up. Epicort #5 was placed in standard fashion last week. He has no issues or complaints today. 9/8; patient presents for follow-up. Epicord #6 was placed in standard fashion last week. He tolerated this well and has no issues or complaints today. 9/15; Epicort No. 7 the wound looks healthy. Perhaps some hyper granulated tissue in the lower 50% of the longitudinal wound 9/22; Epicord #8 placed at last clinic visit. Wound appears well-healing. Patient has no issues or complaints today. 9/28. Epicord No. 9. Continued improvement. 10/6; patient has completed the allotted epicord Applications that insurance would cover. Insurance denied extending more applications. Patient has no issues or complaints today. He tolerated the last application well. 10/13; patient presents for follow-up. We have been using endoform to the wound bed over the past week. He has been approved for 5 more applications of Epicord. 10/20; patient presents for follow-up. Epicort was placed at last clinic visit. He has no issues or complaints today. He denies signs of infection. 10/27; patient presents for follow-up. Epicort was placed in standard fashion at last clinic visit. He has no issues or complaints today. He denies signs of infection. 11/3; Epicort No. 13. Tunneling depth at 12:00 is 2.5 cm as measured by myself 11/10; last patient's approved Epicort #15 was placed in standard fashion  today. Janalyn Shy is stable however has shown improvement in healing over the past several weeks. He has no issues or complaints today. 11/17; patient presents for follow-up. He has completed his course of approved epi cord dressings. He has improved greatly in his  wound healing. The tunnel is almost completely filled in. He has no issues or complaints today. 11/21; patient presents for follow-up. He has been using endoform to the wound bed. He has no issues or complaints today. 12/1; patient presents for follow-up. He has been using endoform to the wound bed. The wound is almost healed. Electronic Signature(s) Signed: 03/14/2022 9:30:31 AM By: Kalman Shan DO Entered By: Kalman Shan on 03/14/2022 09:27:55 -------------------------------------------------------------------------------- Physical Exam Details Patient Name: Date of Service: Christian Brown E. 03/14/2022 9:15 A M Medical Record Number: [id number] Patient Account Number: [id number] Date of Birth/Sex: Treating RN: 1965-08-08 (56 y.o. M) Primary Care Provider: [name] Other Clinician: MEHTAB, DOLBERRY ([id number]) [id number] Page 3 of 8 Referring Provider: Treating Provider/Extender: [name] Weeks in Treatment: 50 Constitutional respirations regular, non-labored and within target range for patient.Marland Kitchen Psychiatric pleasant and cooperative. Notes T the lumbar spine surgical incision site there is a small open wound that does not tunnel or undermine. Granulation tissue at the base. No signs of infection. o Electronic Signature(s) Signed: 03/14/2022 9:30:31 AM By: Kalman Shan DO Entered By: Kalman Shan on 03/14/2022 09:29:04 -------------------------------------------------------------------------------- Physician Orders Details Patient Name: Date of Service: Christian Brown E. 03/14/2022 9:15 A M Medical Record Number: [id number] Patient Account Number: [id number] Date of Birth/Sex: Treating RN: Feb 07, 1966 (56 y.o. 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 - Dr. Heber [place] Return A ppointment in 2 weeks. - Friday Dr. Heber [place] Return A Anesthetic (In clinic) Topical Lidocaine 5% applied to wound bed Cellular or Tissue Based Products Cellular or Tissue Based Product Type: - Epicord #3 applied 11/22/21 Epicord #4 11/29/21 Epicord #5 12/06/2021 Epicord #6 12/13/21 Epicord # 7 12/20/21 Epicord # 8 12/27/21 Epicord # 9 01/03/22 Epicord # 10 01/09/22 Epicord # 11 01/24/22 Epicord # 12 01/31/22 Epicord #13 02/07/2022 Epicord #14 02/14/2022 Epicord #15 02/21/2022 daptic or Mepitel. (DO NOT REMOVE). - leave Cellular or Tissue Based Product applied to wound bed, secured with steri-strips, cover with A the adaptic and steri-strips in place. Bathing/ Shower/ Hygiene May shower with protection but do not get wound dressing(s) wet. Additional Orders / Instructions Follow Nutritious Diet - Continue to monitor blood sugars daily Wound Treatment Wound #1 - Back Wound Laterality: Midline Peri-Wound Care: Skin Prep Every Other Day/30 Days Discharge Instructions: Use skin prep as directed TAMIM, SKOG ([id number]) [phone].pdf Page 4 of 8 Prim Dressing: Endoform 2x2 in Every Other Day/30 Days ary Discharge Instructions: Moisten with [place] Jelly lightly pack into tunnel and depth of wound. Secondary Dressing: Woven Gauze Sponge, Non-Sterile 4x4 in Every Other Day/30 Days Discharge Instructions: Apply over primary dressing as directed. Secondary Dressing: Zetuvit Plus Silicone Border Dressing 7x7(in/in) (Generic) Every Other Day/30 Days Discharge Instructions: Apply silicone border over primary dressing as directed. Electronic Signature(s) Signed: 03/14/2022 9:30:31 AM By: Kalman Shan DO Entered By:  Kalman Shan on 03/14/2022 09:29:27 -------------------------------------------------------------------------------- Problem List Details Patient Name: Date  of Service: Christian, Murphy 03/14/2022 9:15 A M Medical Record Number: [id number] Patient Account Number: [id number] Date of Birth/Sex: Treating RN: 1966/01/15 (56 y.o. 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 03/14/2022 9:15 A M Medical Record Number: [id number] Patient Account Number: [id number] Date of Birth/Sex: Treating RN: Oct 02, 1965 (56 y.o. M) Primary Care Provider: [name] Other Clinician: Referring Provider: Treating Provider/Extender: [name] [place], ROSENDO COUSER ([id number]) [id number] Page 5 of 8 Weeks in Treatment: 50 Subjective Chief Complaint Information obtained from Patient 03/28/2021; Back wound History of Present Illness (HPI) Admission 03/28/2021 [name] is a 56 year old male with a past medical history of controlled type 2 diabetes on oral agents, obesity and OSA that presents to the clinic for a back wound. On 01/11/2021 patient had a laminectomy with PLIF of L1-S1 by Dr. Venetia Constable because of lumbar stenosis and radiculopathy. He  subsequently developed bacteremia. He had CT imaging on 10/13 of the lumbar spine that showed fluid collection in the soft tissue of the posterior L1 and S1 and was taken to the OR for washout on 10/14. 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 He received 4 weeks of IV antibiotics by infectious disease. After his completion of 4 weeks of IV antibiotics he was continued for an additional 4 weeks of IV cefazolin with a stop date of 12/29. He has been evaluated by plastic surgery and no plans for surgical intervention at this time. Wife is present and reports he has been on the wound VAC for the past 8 weeks with improvement in wound healing. He currently denies systemic signs of infection. 12/22; patient presents for follow-up. He reports no issues since last clinic visit. He denies signs of infection. He has been tolerating the wound VAC well. 12/30; patient presents for follow-up. He reports no issues and has no complaints today. He has been tolerating the wound VAC well. 1/9; patient presents for follow-up. He has no issues or complaints today. He states he feels well. He has had no problems with the wound VAC. 1/16; patient presents for follow-up. He continues to use the wound VAC with no issues. He denies signs of infection. 1/23; patient presents for follow-up. He has been switched from IV cefazolin to oral cefadroxil by infectious disease. He has no issues or complaints today. He denies signs of infection. He continues to tolerate the wound VAC well. 1/30; patient presents for follow-up. He continues to tolerate the wound VAC well. 2/6; patient presents for follow-up. He has no issues or complaints today. He continues to tolerate the wound VAC well. He denies signs of infection. 2/13; patient presents for follow-up. He continues to do well with the wound VAC. He denies any issues. 2/27; patient presents for follow-up. He continues to use the wound VAC without any  issues. He denies signs of infection. 3/20; patient presents for follow-up. He has no issues or complaints today. He continues to use the wound VAC.  4/3; patient presents for follow-up. He continues to use the wound VAC without issues. He denies signs of infection. 4/17; 2-week follow-up. He continues to do well. His measurements are improved. Initially a surgical wound complicated by infection. 5/1; patient presents for follow-up. He has no issues or complaints today. He continues to tolerate the wound VAC well. He denies signs of infection. 5/15; patient presents for follow-up. He has noted more maceration to the periwound. He has been using the wound VAC without issues. He currently denies signs of infection. 5/30; patient presents for follow-up. He has been tolerating the wound VAC well over the past 2 weeks. He no longer has maceration to the periwound. He has no issues or complaints today. 6/8; this patient with a postsurgical wound that was complicated by postop infection. He has been using silver collagen under wound VAC and gradually doing well improvement in dimensions especially the tunnel at 12:00 6/22; patient presents for follow-up. We have been using silver collagen under the wound VAC. Patient has no issues or questions today. 7/6; patient presents for follow-up. Patient continues to use collagen under the wound VAC with no issues. 7/20; patient presents for follow-up. He has been using collagen under the wound VAC with no issues. He has been approved for Epicord. This was discussed with the patient and he is in agreement to having this placed at next clinic visit. 7/27; patient presents for follow-up. He has been tolerating the wound VAC well with collagen underneath. He has been approved for epi cord and we have this to place in office today. 8/4; patient presents for follow-up. He had Epicort placed at last clinic visit. We held off on the wound VAC. He tolerated this well. He has  no issues or complaints today. 8/11; patient presents for follow-up. Epicord was placed at last clinic visit. He had the wound VAC on for the past week. He states that no drainage was suctioned into the canister. He has slight maceration to the periwound. 8/18; patient presents for follow-up. Epicord #3 was placed in standard fashion last week. The wound VAC was held. He has no issues or complaints today. 8/25; Patient presents for follow-up. Epicord #4 was placed in standard fashion last week. The wound VAC was held again. He has no issues or complaints today. There has been improvement in wound healing. 9/1; patient presents for follow-up. Epicort #5 was placed in standard fashion last week. He has no issues or complaints today. 9/8; patient presents for follow-up. Epicord #6 was placed in standard fashion last week. He tolerated this well and has no issues or complaints today. 9/15; Epicort No. 7 the wound looks healthy. Perhaps some hyper granulated tissue in the lower 50% of the longitudinal wound 9/22; Epicord #8 placed at last clinic visit. Wound appears well-healing. Patient has no issues or complaints today. 9/28. Epicord No. 9. Continued improvement. 10/6; patient has completed the allotted epicord Applications that insurance would cover. Insurance denied extending more applications. Patient has no issues or complaints today. He tolerated the last application well. 10/13; patient presents for follow-up. We have been using endoform to the wound bed over the past week. He has been approved for 5 more applications of ROSE, HIPPLER ([id number]) [phone].pdf Page 6 of 8 Epicord. 10/20; patient presents for follow-up. Epicort was placed at last clinic visit. He has no issues or complaints today. He denies signs of infection. 10/27; patient presents for follow-up. Epicort was placed in standard fashion at last clinic visit. He  has no issues or complaints today. He  denies signs of infection. 11/3; Epicort No. 13. Tunneling depth at 12:00 is 2.5 cm as measured by myself 11/10; last patient's approved Epicort #15 was placed in standard fashion today. Tunneling is stable however has shown improvement in healing over the past several weeks. He has no issues or complaints today. 11/17; patient presents for follow-up. He has completed his course of approved epi cord dressings. He has improved greatly in his wound healing. The tunnel is almost completely filled in. He has no issues or complaints today. 11/21; patient presents for follow-up. He has been using endoform to the wound bed. He has no issues or complaints today. 12/1; patient presents for follow-up. He has been using endoform to the wound bed. The wound is almost healed. Patient History Information obtained from Patient. Family History Diabetes - Mother, Stroke - Siblings, No family history of Cancer, Heart Disease, Hereditary Spherocytosis, Hypertension, Kidney Disease, Lung Disease, Seizures, Thyroid Problems, Tuberculosis. Social History Never smoker, Marital Status - Married, Alcohol Use - Rarely, Drug Use - No History, Caffeine Use - Rarely. Medical History Cardiovascular Patient has history of Hypertension Endocrine Patient has history of Type II Diabetes Musculoskeletal Patient has history of Osteomyelitis Medical A Surgical History Notes nd Musculoskeletal DDD Objective Constitutional respirations regular, non-labored and within target range for patient.. Vitals Time Taken: 8:48 AM, Temperature: 98.7 F, Pulse: 84 bpm, Respiratory Rate: 17 breaths/min, Blood Pressure: 146/74 mmHg. Psychiatric pleasant and cooperative. General Notes: T the lumbar spine surgical incision site there is a small open wound that does not tunnel or undermine. Granulation tissue at the base. No o signs of infection. Integumentary (Hair, Skin) Wound #1 status is Open. Original cause of wound was  Surgical Injury. The date acquired was: 01/11/2021. The wound has been in treatment 50 weeks. The wound is located on the Midline Back. The wound measures 0.2cm length x 0.2cm width x 0.2cm depth; 0.031cm^2 area and 0.006cm^3 volume. There is Fat Layer (Subcutaneous Tissue) exposed. There is no undermining noted, however, there is tunneling at 12:00 with a maximum distance of 5cm. There is a medium amount of serosanguineous drainage noted. The wound margin is distinct with the outline attached to the wound base. There is large (67-100%) pink granulation within the wound bed. There is a small (1-33%) amount of necrotic tissue within the wound bed including Adherent Slough. The periwound skin appearance did not exhibit: Callus, Crepitus, Excoriation, Induration, Rash, Scarring, Dry/Scaly, Maceration, Atrophie Blanche, Cyanosis, Ecchymosis, Hemosiderin Staining, Mottled, Pallor, Rubor, Erythema. Periwound temperature was noted as No Abnormality. 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 The wound is almost healed. He has a very small open wound present and I recommended continuing endoform here. Follow-up in 1 week. Plan Follow-up Appointments: Return Appointment in 1 week. - Dr. Heber [place] Return Appointment in 2 weeks. - Friday Dr. Heber [place] Anesthetic: (In clinic) Topical Lidocaine 5% applied to wound bed Cellular or Tissue Based Products: Cellular or Tissue Based Product Type: - Epicord #3 applied 11/22/21 Epicord #4 11/29/21 Epicord #5 12/06/2021 Epicord #6 12/13/21 Epicord # 7 12/20/21 Epicord # 8 12/27/21 Epicord # 9 01/03/22 Epicord # 10 01/09/22 Epicord # 11 01/24/22 Epicord # 12 01/31/22 Epicord #13 02/07/2022 Epicord #14 02/14/2022 Epicord #15  02/21/2022 Cellular or Tissue Based Product applied to wound  bed, secured with steri-strips, cover with Adaptic or Mepitel. (DO NOT REMOVE). - leave the adaptic and steri-strips in place. Bathing/ Shower/ Hygiene: May shower with protection but do not get wound dressing(s) wet. Additional Orders / Instructions: Follow Nutritious Diet - Continue to monitor blood sugars daily WOUND #1: - Back Wound Laterality: Midline Peri-Wound Care: Skin Prep Every Other Day/30 Days Discharge Instructions: Use skin prep as directed Prim Dressing: Endoform 2x2 in Every Other Day/30 Days ary Discharge Instructions: Moisten with [place] Jelly lightly pack into tunnel and depth of wound. Secondary Dressing: Woven Gauze Sponge, Non-Sterile 4x4 in Every Other Day/30 Days Discharge Instructions: Apply over primary dressing as directed. Secondary Dressing: Zetuvit Plus Silicone Border Dressing 7x7(in/in) (Generic) Every Other Day/30 Days Discharge Instructions: Apply silicone border over primary dressing as directed. 1. Endoform 2. Follow-up in 1 week Electronic Signature(s) Signed: 03/14/2022 9:30:31 AM By: Kalman Shan DO Entered By: Kalman Shan on 03/14/2022 09:30:01 -------------------------------------------------------------------------------- HxROS Details Patient Name: Date of Service: Christian Brown E. 03/14/2022 9:15 A M Medical Record Number: [id number] Patient Account Number: [id number] Date of Birth/Sex: Treating RN: [birth year] (56 y.o. M) Primary Care Provider: [name] Other Clinician: Referring Provider: Treating Provider/Extender: [name] Weeks in Treatment: 16 Information Obtained From Patient Cardiovascular Medical History: Positive for: Hypertension Endocrine Medical History: Positive for: Type II Diabetes Time with diabetes: Since mid 90's Treated with: Oral agents Blood sugar tested every day: Yes Tested : 2x day Musculoskeletal Medical History: ZYLEN, WENIG ([id number]) [phone].pdf Page 8 of 8 Positive for: Osteomyelitis Past Medical History Notes: DDD Immunizations Pneumococcal Vaccine: Received Pneumococcal Vaccination: Yes Received Pneumococcal Vaccination On or After 60th Birthday: No Implantable Devices Yes Family and Social History Cancer: No; Diabetes: Yes - Mother; Heart Disease: No; Hereditary Spherocytosis: No; Hypertension: No; Kidney Disease: No; Lung Disease: No; Seizures: No; Stroke: Yes - Siblings; Thyroid Problems: No; Tuberculosis: No; Never smoker; Marital Status - Married; Alcohol Use: Rarely; Drug Use: No History; Caffeine Use: Rarely; Financial Concerns: No; Food, Clothing or Shelter Needs: No; Support System Lacking: No; Transportation Concerns: No Electronic Signature(s) Signed: 03/14/2022 9:30:31 AM By: Kalman Shan DO Entered By: Kalman Shan on 03/14/2022 09:28:02 -------------------------------------------------------------------------------- SuperBill Details Patient Name: Date of Service: [name] 03/14/2022 Medical Record Number: [id number] Patient Account Number: [id number] Date of Birth/Sex: Treating RN: 1965/07/17 (56 y.o. 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 M86.9 Osteomyelitis, unspecified E11.622 Type 2 diabetes mellitus with other skin ulcer Facility Procedures : CPT4 Code: [id number] Description: 99213 - WOUND CARE VISIT-LEV 3 EST PT Modifier: Quantity: 1 Physician Procedures : CPT4 Code Description Modifier [id number] - WC PHYS LEVEL 3 - EST PT ICD-10 Diagnosis Description L98.424 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

## 2022-03-17 NOTE — Progress Notes (Signed)
REA, RESER ([id number]) [id number] Page 1 of 9 Visit Report for 03/14/2022 Arrival Information Details Patient Name: Date of Service: [name], [name] 03/14/2022 9:15 A M Medical Record Number: [id number] Patient Account Number: [id number] Date of Birth/Sex: Treating RN: 24-Jan-1966 (56 y.o. [name], [name] Primary Care Provider: [name] Other Clinician: Referring Provider: Treating Provider/Extender: [name] Weeks in Treatment: 56 Visit Information History Since Last Visit Added or deleted any medications: No Patient Arrived: Ambulatory Any new allergies or adverse reactions: No Arrival Time: 08:48 Had a fall or experienced change in No Accompanied By: self activities of daily living that may affect Transfer Assistance: None risk of falls: Patient Identification Verified: Yes Signs or symptoms of abuse/neglect since last visito No Secondary Verification Process Completed: Yes Hospitalized since last visit: No Patient Requires Transmission-Based Precautions: No Implantable device outside of the clinic excluding No Patient Has Alerts: Yes cellular tissue based products placed in the center Patient Alerts: Patient on Blood Thinner since last visit: No BP Right Arm-PICC Has Dressing in Place as Prescribed: Yes Pain Present Now: No Electronic Signature(s) Signed: 03/17/2022 3:46:46 PM By: [name] RN Entered By: [name] on 03/14/2022 08:49:09 -------------------------------------------------------------------------------- Clinic Level of Care Assessment Details Patient Name: Date of Service: [name], [name] 03/14/2022 9:15 A M Medical Record Number: [id number] Patient Account Number: [id number] Date of Birth/Sex: Treating RN: 1965/12/17 (56 y.o. [name] Primary Care Provider: [name] Other Clinician: Referring Provider: Treating Provider/Extender: [name] Weeks in Treatment:  50 Clinic Level of Care Assessment Items TOOL 4 Quantity Score X- 1 0 Use when only an EandM is performed on FOLLOW-UP visit ASSESSMENTS - Nursing Assessment / Reassessment X- 1 10 Reassessment of Co-morbidities (includes updates in patient status) X- 1 5 Reassessment of Adherence to Treatment Plan ASSESSMENTS - Wound and Skin A ssessment / Reassessment X - Simple Wound Assessment / Reassessment - one wound 1 5 [] - 0 Complex Wound Assessment / Reassessment - multiple wounds X- 1 10 Dermatologic / Skin Assessment (not related to wound area) ASSESSMENTS - Focused Assessment [] - 0 Circumferential Edema Measurements - multi extremities [] - 0 Nutritional Assessment / Counseling / Intervention [name], [name] ([id number]) [phone].pdf Page 2 of 9 [] - 0 Lower Extremity Assessment (monofilament, tuning fork, pulses) [] - 0 Peripheral Arterial Disease Assessment (using hand held doppler) ASSESSMENTS - Ostomy and/or Continence Assessment and Care [] - 0 Incontinence Assessment and Management [] - 0 Ostomy Care Assessment and Management (repouching, etc.) PROCESS - Coordination of Care X - Simple Patient / Family Education for ongoing care 1 15 [] - 0 Complex (extensive) Patient / Family Education for ongoing care X- 1 10 Staff obtains Programmer, systems, Records, T Results / Process Orders est [] - 0 Staff telephones HHA, Nursing Homes / Clarify orders / etc [] - 0 Routine Transfer to another Facility (non-emergent condition) [] - 0 Routine Hospital Admission (non-emergent condition) [] - 0 New Admissions / Biomedical engineer / Ordering NPWT Apligraf, etc. , [] - 0 Emergency Hospital Admission (emergent condition) X- 1 10 Simple Discharge Coordination [] - 0 Complex (extensive) Discharge Coordination PROCESS - Special Needs [] - 0 Pediatric / Minor Patient Management [] - 0 Isolation Patient Management [] - 0 Hearing / Language / Visual special  needs [] - 0 Assessment of Community assistance (transportation, D/C planning, etc.) [] - 0 Additional assistance / Altered mentation [] - 0 Support Surface(s) Assessment (bed, cushion, seat, etc.)  INTERVENTIONS - Wound Cleansing / Measurement X - Simple Wound Cleansing - one wound 1 5 [] - 0 Complex Wound Cleansing - multiple wounds X- 1 5 Wound Imaging (photographs - any number of wounds) [] - 0 Wound Tracing (instead of photographs) X- 1 5 Simple Wound Measurement - one wound [] - 0 Complex Wound Measurement - multiple wounds INTERVENTIONS - Wound Dressings X - Small Wound Dressing one or multiple wounds 1 10 [] - 0 Medium Wound Dressing one or multiple wounds [] - 0 Large Wound Dressing one or multiple wounds [] - 0 Application of Medications - topical [] - 0 Application of Medications - injection INTERVENTIONS - Miscellaneous [] - 0 External ear exam [] - 0 Specimen Collection (cultures, biopsies, blood, body fluids, etc.) [] - 0 Specimen(s) / Culture(s) sent or taken to Lab for analysis [] - 0 Patient Transfer (multiple staff / Civil Service fast streamer / Similar devices) [] - 0 Simple Staple / Suture removal (25 or less) [] - 0 Complex Staple / Suture removal (26 or more) [] - 0 Hypo / Hyperglycemic Management (close monitor of Blood Glucose) [name], [name] ([id number]) [id number].pdf Page 3 of 9 [] - 0 Ankle / Brachial Index (ABI) - do not check if billed separately X- 1 5 Vital Signs Has the patient been seen at the hospital within the last three years: Yes Total Score: 95 Level Of Care: New/Established - Level 3 Electronic Signature(s) Signed: 03/14/2022 4:33:25 PM By: Deon Pilling RN, BSN Entered By: Deon Pilling on 03/14/2022 09:03:32 -------------------------------------------------------------------------------- Encounter Discharge Information Details Patient Name: Date of Service: Christian Grout. 03/14/2022 9:15 A M Medical Record  Number: [id number] Patient Account Number: [id number] Date of Birth/Sex: Treating RN: [birth year] (56 y.o. [name] Primary Care Provider: [name] Other Clinician: Referring Provider: Treating Provider/Extender: [name] Weeks in Treatment: 55 Encounter Discharge Information Items Discharge Condition: Stable Ambulatory Status: Ambulatory Discharge Destination: Home Transportation: Private Auto Accompanied By: self Schedule Follow-up Appointment: Yes Clinical Summary of Care: Electronic Signature(s) Signed: 03/14/2022 4:33:25 PM By: Deon Pilling RN, BSN Entered By: Deon Pilling on 03/14/2022 09:04:02 -------------------------------------------------------------------------------- Lower Extremity Assessment Details Patient Name: Date of Service: Christian Brown Murphy. 03/14/2022 9:15 A M Medical Record Number: [id number] Patient Account Number: [id number] Date of Birth/Sex: Treating RN: Jun 18, 1965 (56 y.o. [name] Primary Care Provider: [name] Other Clinician: Referring Provider: Treating Provider/Extender: [name] Weeks in Treatment: 50 Electronic Signature(s) Signed: 03/17/2022 3:46:46 PM By: [name] RN Entered By: [name] on 03/14/2022 08:48:51 -------------------------------------------------------------------------------- Multi Wound Chart Details Patient Name: Date of Service: Christian Brown Murphy. 03/14/2022 9:15 A M Medical Record Number: [id number] Patient Account Number: [id number] [name], [name] ([id number]) [id number].pdf Page 4 of 9 Date of Birth/Sex: Treating RN: 10-Nov-1965 (56 y.o. M) Primary Care Provider: Other Clinician: [name] Referring Provider: Treating Provider/Extender: [name] Weeks in Treatment: 50 Vital Signs Height(in): Pulse(bpm): 84 Weight(lbs): Blood Pressure(mmHg): 146/74 Body Mass Index(BMI): Temperature(F): 98.7 Respiratory  Rate(breaths/min): 17 [1:Photos:] [N/A:N/A] Midline Back N/A N/A Wound Location: Surgical Injury N/A N/A Wounding Event: Open Surgical Wound N/A N/A Primary Etiology: Hypertension, Type II Diabetes, N/A N/A Comorbid History: Osteomyelitis 01/11/2021 N/A N/A Date Acquired: 50 N/A N/A Weeks of Treatment: Open N/A N/A Wound Status: No N/A N/A Wound Recurrence: 0.2x0.2x0.2 N/A N/A Measurements L x W x D (cm) 0.031 N/A N/A A (cm) : rea 0.006 N/A N/A Volume (cm) : 100.00% N/A N/A % Reduction in  A rea: 100.00% N/A N/A % Reduction in Volume: 12 Position 1 (o'clock): 5 Maximum Distance 1 (cm): Yes N/A N/A Tunneling: Full Thickness With Exposed Support N/A N/A Classification: Structures Medium N/A N/A Exudate Amount: Serosanguineous N/A N/A Exudate Type: red, brown N/A N/A Exudate Color: Distinct, outline attached N/A N/A Wound Margin: Large (67-100%) N/A N/A Granulation Amount: Pink N/A N/A Granulation Quality: Small (1-33%) N/A N/A Necrotic Amount: Fat Layer (Subcutaneous Tissue): Yes N/A N/A Exposed Structures: Fascia: No Tendon: No Muscle: No Joint: No Bone: No Large (67-100%) N/A N/A Epithelialization: Excoriation: No N/A N/A Periwound Skin Texture: Induration: No Callus: No Crepitus: No Rash: No Scarring: No Maceration: No N/A N/A Periwound Skin Moisture: Dry/Scaly: No Atrophie Blanche: No N/A N/A Periwound Skin Color: Cyanosis: No Ecchymosis: No Erythema: No Hemosiderin Staining: No Mottled: No Pallor: No Rubor: No No Abnormality N/A N/A Temperature: Treatment Notes Wound #1 (Back) Wound Laterality: Midline Cleanser Peri-Wound Care [name], [name] ([id number]) [phone].pdf Page 5 of 9 Skin Prep Discharge Instruction: Use skin prep as directed Topical Primary Dressing Endoform 2x2 in Discharge Instruction: Moisten with [place] Jelly lightly pack into tunnel and depth of wound. Secondary Dressing Woven Gauze  Sponge, Non-Sterile 4x4 in Discharge Instruction: Apply over primary dressing as directed. Zetuvit Plus Silicone Border Dressing 7x7(in/in) Discharge Instruction: Apply silicone border over primary dressing as directed. Secured With Compression Wrap Compression Stockings Environmental education officer) Signed: 03/14/2022 9:30:31 AM By: Kalman Shan DO Entered By: Kalman Shan on 03/14/2022 09:26:49 -------------------------------------------------------------------------------- Multi-Disciplinary Care Plan Details Patient Name: Date of Service: Christian Grout. 03/14/2022 9:15 A M Medical Record Number: [id number] Patient Account Number: [id number] Date of Birth/Sex: Treating RN: 10/09/1965 (56 y.o. [name] Primary Care Provider: [name] Other Clinician: Referring Provider: Treating Provider/Extender: [name] Weeks in Treatment: [place] reviewed with physician Active Inactive Wound/Skin Impairment Nursing Diagnoses: Impaired tissue integrity Goals: Patient/caregiver will verbalize understanding of skin care regimen Date Initiated: 03/28/2021 Target Resolution Date: 05/16/2022 Goal Status: Active Ulcer/skin breakdown will have a volume reduction of 30% by week 4 Date Initiated: 03/28/2021 Date Inactivated: 05/27/2021 Target Resolution Date: 05/31/2021 Goal Status: Met Interventions: Assess patient/caregiver ability to obtain necessary supplies Assess patient/caregiver ability to perform ulcer/skin care regimen upon admission and as needed Assess ulceration(s) every visit Provide education on ulcer and skin care Treatment Activities: Topical wound management initiated : 03/28/2021 Notes: Electronic Signature(s) [name], [name] ([id number]) [phone].pdf Page 6 of 9 Signed: 03/14/2022 4:33:25 PM By: Deon Pilling RN, BSN Entered By: Deon Pilling on 03/14/2022  08:50:03 -------------------------------------------------------------------------------- Pain Assessment Details Patient Name: Date of Service: Christian Brown Murphy. 03/14/2022 9:15 A M Medical Record Number: [id number] Patient Account Number: [id number] Date of Birth/Sex: Treating RN: [birth year] (56 y.o. [name] Primary Care Provider: [name] Other Clinician: Referring Provider: Treating Provider/Extender: [name] Weeks in Treatment: 50 Active Problems Location of Pain Severity and Description of Pain Patient Has Paino No Site Locations Pain Management and Medication Current Pain Management: Electronic Signature(s) Signed: 03/17/2022 3:46:46 PM By: [name] RN Entered By: [name] on 03/14/2022 08:48:43 -------------------------------------------------------------------------------- Patient/Caregiver Education Details Patient Name: Date of Service: Christian Grout 12/1/2023andnbsp9:15 [place] Record Number: [id number] Patient Account Number: [id number] Date of Birth/Gender: Treating RN: 05/06/1965 (56 y.o. [name] Primary Care Physician: [name] Other Clinician: Referring Physician: Treating Physician/Extender: [name] in Treatment: 69 Education Assessment Education Provided To: Patient [name], [name] ([id number]) [id number] Page  7 of 9 Education Topics Provided Wound/Skin Impairment: Handouts: Skin Care Do's and Dont's Methods: Explain/Verbal Responses: Reinforcements needed Electronic Signature(s) Signed: 03/14/2022 4:33:25 PM By: Deon Pilling RN, BSN Entered By: Deon Pilling on 03/14/2022 08:50:13 -------------------------------------------------------------------------------- Wound Assessment Details Patient Name: Date of Service: Christian Brown Murphy. 03/14/2022 9:15 A M Medical Record Number: [id number] Patient Account Number: [id number] Date of Birth/Sex:  Treating RN: 04/11/1966 (56 y.o. [name], [name] Primary Care Chelse Matas: [name] Other Clinician: Referring Torrey Ballinas: Treating [name]/Extender: [name] Weeks in Treatment: 50 Wound Status Wound Number: 1 Primary Etiology: Open Surgical Wound Wound Location: Midline Back Wound Status: Open Wounding Event: Surgical Injury Comorbid History: Hypertension, Type II Diabetes, Osteomyelitis Date Acquired: 01/11/2021 Weeks Of Treatment: 50 Clustered Wound: No Photos Wound Measurements Length: (cm) 0.2 Width: (cm) 0.2 Depth: (cm) 0.2 Area: (cm) 0.031 Volume: (cm) 0.006 % Reduction in Area: 100% % Reduction in Volume: 100% Epithelialization: Large (67-100%) Tunneling: No Undermining: No Wound Description Classification: Full Thickness With Exposed Suppo Wound Margin: Distinct, outline attached Exudate Amount: Medium Exudate Type: Serosanguineous Exudate Color: red, brown rt Structures Foul Odor After Cleansing: No Slough/Fibrino Yes Wound Bed Granulation Amount: Large (67-100%) Exposed Structure Granulation Quality: Pink Fascia Exposed: No Necrotic Amount: Small (1-33%) Fat Layer (Subcutaneous Tissue) Exposed: Yes Necrotic Quality: Adherent Slough Tendon Exposed: No Muscle Exposed: No Joint Exposed: No Bone Exposed: No [name], BISCEGLIA ([id number]) [phone].pdf Page 8 of 9 Periwound Skin Texture Texture Color No Abnormalities Noted: No No Abnormalities Noted: No Callus: No Atrophie Blanche: No Crepitus: No Cyanosis: No Excoriation: No Ecchymosis: No Induration: No Erythema: No Rash: No Hemosiderin Staining: No Scarring: No Mottled: No Pallor: No Moisture Rubor: No No Abnormalities Noted: No Dry / Scaly: No Temperature / Pain Maceration: No Temperature: No Abnormality Treatment Notes Wound #1 (Back) Wound Laterality: Midline Cleanser Peri-Wound Care Skin Prep Discharge Instruction: Use skin prep as  directed Topical Primary Dressing Endoform 2x2 in Discharge Instruction: Moisten with [place] Jelly lightly pack into tunnel and depth of wound. Secondary Dressing Woven Gauze Sponge, Non-Sterile 4x4 in Discharge Instruction: Apply over primary dressing as directed. Zetuvit Plus Silicone Border Dressing 7x7(in/in) Discharge Instruction: Apply silicone border over primary dressing as directed. Secured With Compression Wrap Compression Stockings Add-Ons Electronic Signature(s) Signed: 03/14/2022 11:44:41 AM By: Kalman Shan DO Signed: 03/17/2022 3:46:46 PM By: [name] RN Entered By: Kalman Shan on 03/14/2022 09:51:39 -------------------------------------------------------------------------------- Vitals Details Patient Name: Date of Service: Christian Brown Murphy. 03/14/2022 9:15 A M Medical Record Number: [id number] Patient Account Number: [id number] Date of Birth/Sex: Treating RN: [birth year] (56 y.o. [name] Primary Care Darrelle Barrell: [name] Other Clinician: Referring [place] Zaragoza: Treating Delonte Musich/Extender: [name] Weeks in Treatment: 50 Vital Signs Time Taken: 08:48 Temperature (F): 98.7 Pulse (bpm): 84 Respiratory Rate (breaths/min): 17 Blood Pressure (mmHg): 146/74 Reference Range: 80 - 120 mg / dl Electronic Signature(s) KAIDON, KINKER ([id number]) [phone].pdf Page 9 of 9 Signed: 03/17/2022 3:46:46 PM By: [name] RN Entered By: [name] on 03/14/2022 08:48:22

## 2022-03-19 ENCOUNTER — Other Ambulatory Visit (HOSPITAL_COMMUNITY): Payer: Self-pay

## 2022-03-21 ENCOUNTER — Encounter (HOSPITAL_BASED_OUTPATIENT_CLINIC_OR_DEPARTMENT_OTHER): Payer: No Typology Code available for payment source | Admitting: Internal Medicine

## 2022-03-21 DIAGNOSIS — M869 Osteomyelitis, unspecified: Secondary | ICD-10-CM

## 2022-03-21 DIAGNOSIS — E11622 Type 2 diabetes mellitus with other skin ulcer: Secondary | ICD-10-CM

## 2022-03-21 DIAGNOSIS — L98424 Non-pressure chronic ulcer of back with necrosis of bone: Secondary | ICD-10-CM | POA: Diagnosis not present

## 2022-03-21 NOTE — Progress Notes (Signed)
[name] ([id number]) [id number] Page 1 of 8 Visit Report for 03/21/2022 Chief Complaint Document Details Patient Name: Date of Service: [name], [name] 03/21/2022 9:15 A M Medical Record Number: [id number] Patient Account Number: [id number] Date of Birth/Sex: Treating RN: 1965-07-22 (56 y.o. M) Primary Care Provider: Kathlene Murphy Other Clinician: Referring Provider: Treating Provider/Extender: [name] in Treatment: [name] from: Patient Chief Complaint 03/28/2021; Back wound Electronic Signature(s) Signed: 03/21/2022 11:11:46 AM By: Kalman Shan DO Entered By: Kalman Shan on 03/21/2022 11:06:14 -------------------------------------------------------------------------------- HPI Details Patient Name: Date of Service: Christian Brown E. 03/21/2022 9:15 A M Medical Record Number: [id number] Patient Account Number: [id number] Date of Birth/Sex: Treating RN: 1965/10/30 (56 y.o. M) Primary Care Provider: Kathlene Murphy Other Clinician: Referring Provider: Treating Provider/Extender: [name] in Treatment: 59 History of Present Illness HPI Description: Admission 03/28/2021 [name] is a 56 year old male with a past medical history of controlled type 2 diabetes on oral agents, obesity and OSA that presents to the clinic for a back wound. On 01/11/2021 patient had a laminectomy with PLIF of L1-S1 by Dr. Venetia Constable because of lumbar stenosis and radiculopathy. He subsequently developed bacteremia. He had CT imaging on 10/13 of the lumbar spine that showed fluid collection in the soft tissue of the posterior L1 and S1 and was taken to the OR for washout on 10/14. 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 He received 4 Murphy of IV antibiotics by infectious disease. After his completion of 4 Murphy of IV antibiotics he was continued for an additional 4 Murphy of IV  cefazolin with a stop date of 12/29. He has been evaluated by plastic surgery and no plans for surgical intervention at this time. Wife is present and reports he has been on the wound VAC for the past 8 Murphy with improvement in wound healing. He currently denies systemic signs of infection. 12/22; patient presents for follow-up. He reports no issues since last clinic visit. He denies signs of infection. He has been tolerating the wound VAC well. 12/30; patient presents for follow-up. He reports no issues and has no complaints today. He has been tolerating the wound VAC well. 1/9; patient presents for follow-up. He has no issues or complaints today. He [name] he feels well. He has had no problems with the wound VAC. 1/16; patient presents for follow-up. He continues to use the wound VAC with no issues. He denies signs of infection. 1/23; patient presents for follow-up. He has been switched from IV cefazolin to oral cefadroxil by infectious disease. He has no issues or complaints today. He denies signs of infection. He continues to tolerate the wound VAC well. 1/30; patient presents for follow-up. He continues to tolerate the wound VAC well. 2/6; patient presents for follow-up. He has no issues or complaints today. He continues to tolerate the wound VAC well. He denies signs of infection. 2/13; patient presents for follow-up. He continues to do well with the wound VAC. He denies any issues. 2/27; patient presents for follow-up. He continues to use the wound VAC without any issues. He denies signs of infection. 3/20; patient presents for follow-up. He has no issues or complaints today. He continues to use the wound VAC. [name], [name] ([id number]) [id number] Page 2 of 8 4/3; patient presents for follow-up. He continues to use the wound VAC without issues. He denies signs of infection. 4/17; 2-week follow-up. He continues to do well. His measurements  are improved. Initially a  surgical wound complicated by infection. 5/1; patient presents for follow-up. He has no issues or complaints today. He continues to tolerate the wound VAC well. He denies signs of infection. 5/15; patient presents for follow-up. He has noted more maceration to the periwound. He has been using the wound VAC without issues. He currently denies signs of infection. 5/30; patient presents for follow-up. He has been tolerating the wound VAC well over the past 2 Murphy. He no longer has maceration to the periwound. He has no issues or complaints today. 6/8; this patient with a postsurgical wound that was complicated by postop infection. He has been using silver collagen under wound VAC and gradually doing well improvement in dimensions especially the tunnel at 12:00 6/22; patient presents for follow-up. We have been using silver collagen under the wound VAC. Patient has no issues or questions today. 7/6; patient presents for follow-up. Patient continues to use collagen under the wound VAC with no issues. 7/20; patient presents for follow-up. He has been using collagen under the wound VAC with no issues. He has been approved for Epicord. This was discussed with the patient and he is in agreement to having this placed at next clinic visit. 7/27; patient presents for follow-up. He has been tolerating the wound VAC well with collagen underneath. He has been approved for epi cord and we have this to place in office today. 8/4; patient presents for follow-up. He had Epicort placed at last clinic visit. We held off on the wound VAC. He tolerated this well. He has no issues or complaints today. 8/11; patient presents for follow-up. Epicord was placed at last clinic visit. He had the wound VAC on for the past week. He [name] that no drainage was suctioned into the canister. He has slight maceration to the periwound. 8/18; patient presents for follow-up. Epicord #3 was placed in standard fashion last week. The wound  VAC was held. He has no issues or complaints today. 8/25; Patient presents for follow-up. Epicord #4 was placed in standard fashion last week. The wound VAC was held again. He has no issues or complaints today. There has been improvement in wound healing. 9/1; patient presents for follow-up. Epicort #5 was placed in standard fashion last week. He has no issues or complaints today. 9/8; patient presents for follow-up. Epicord #6 was placed in standard fashion last week. He tolerated this well and has no issues or complaints today. 9/15; Epicort No. 7 the wound looks healthy. Perhaps some hyper granulated tissue in the lower 50% of the longitudinal wound 9/22; Epicord #8 placed at last clinic visit. Wound appears well-healing. Patient has no issues or complaints today. 9/28. Epicord No. 9. Continued improvement. 10/6; patient has completed the allotted epicord Applications that insurance would cover. Insurance denied extending more applications. Patient has no issues or complaints today. He tolerated the last application well. 10/13; patient presents for follow-up. We have been using endoform to the wound bed over the past week. He has been approved for 5 more applications of Epicord. 10/20; patient presents for follow-up. Epicort was placed at last clinic visit. He has no issues or complaints today. He denies signs of infection. 10/27; patient presents for follow-up. Epicort was placed in standard fashion at last clinic visit. He has no issues or complaints today. He denies signs of infection. 11/3; Epicort No. 13. Tunneling depth at 12:00 is 2.5 cm as measured by myself 11/10; last patient's approved Epicort #15 was placed in standard fashion  today. Janalyn Shy is stable however has shown improvement in healing over the past several Murphy. He has no issues or complaints today. 11/17; patient presents for follow-up. He has completed his course of approved epi cord dressings. He has improved greatly in  his wound healing. The tunnel is almost completely filled in. He has no issues or complaints today. 11/21; patient presents for follow-up. He has been using endoform to the wound bed. He has no issues or complaints today. 12/1; patient presents for follow-up. He has been using endoform to the wound bed. The wound is almost healed. 12/8; patient presents for follow-up. He has been using endoform to the wound bed. There still remains a small open wound. He denies signs of infection. Electronic Signature(s) Signed: 03/21/2022 11:11:46 AM By: Kalman Shan DO Entered By: Kalman Shan on 03/21/2022 11:06:46 -------------------------------------------------------------------------------- Physical Exam Details Patient Name: Date of Service: [name], [name] 03/21/2022 9:15 A M Medical Record Number: [id number] Patient Account Number: [id number] Date of Birth/Sex: Treating RN: [birth year] (56 y.o. [name], [name] ([id number]) [id number] Page 3 of 8 Primary Care Provider: Kathlene Murphy Other Clinician: Referring Provider: Treating Provider/Extender: [name] in Treatment: 69 Constitutional respirations regular, non-labored and within target range for patient.. Cardiovascular 2+ dorsalis pedis/posterior tibialis pulses. Psychiatric pleasant and cooperative. Notes T the lumbar spine surgical incision site there is a small open wound that has minimal tunneling. Granulation tissue at the opening. No signs of infection. o Electronic Signature(s) Signed: 03/21/2022 11:11:46 AM By: Kalman Shan DO Entered By: Kalman Shan on 03/21/2022 11:08:12 -------------------------------------------------------------------------------- Physician Orders Details Patient Name: Date of Service: Christian Brown E. 03/21/2022 9:15 A M Medical Record Number: [id number] Patient Account Number: [id number] Date of Birth/Sex: Treating RN: 12-25-1965 (56 y.o. 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 - Friday Dr. Heber [facility] Return A Anesthetic (In clinic) Topical Lidocaine 5% applied to wound bed Cellular or Tissue Based Products Cellular or Tissue Based Product Type: - Epicord #3 applied 11/22/21 Epicord #4 11/29/21 Epicord #5 12/06/2021 Epicord #6 12/13/21 Epicord # 7 12/20/21 Epicord # 8 12/27/21 Epicord # 9 01/03/22 Epicord # 10 01/09/22 Epicord # 11 01/24/22 Epicord # 12 01/31/22 Epicord #13 02/07/2022 Epicord #14 02/14/2022 Epicord #15 02/21/2022 daptic or Mepitel. (DO NOT REMOVE). - leave Cellular or Tissue Based Product applied to wound bed, secured with steri-strips, cover with A the adaptic and steri-strips in place. Bathing/ Shower/ Hygiene May shower with protection but do not get wound dressing(s) wet. Additional Orders / Instructions Follow Nutritious Diet - Continue to monitor blood sugars daily Wound Treatment [name], [name] ([id number]) [phone].pdf Page 4 of 8 Wound #1 - Back Wound Laterality: Midline Peri-Wound Care: Skin Prep Every Other Day/30 Days Discharge Instructions: Use skin prep as directed Prim Dressing: Endoform 2x2 in Every Other Day/30 Days ary Discharge Instructions: Moisten with [place] Jelly lightly pack into tunnel and depth of wound. Secondary Dressing: Woven Gauze Sponge, Non-Sterile 4x4 in Every Other Day/30 Days Discharge Instructions: Apply over primary dressing as directed. Secondary Dressing: Zetuvit Plus Silicone Border Dressing 7x7(in/in) (Generic) Every Other Day/30  Days Discharge Instructions: Apply silicone border over primary dressing as directed. Electronic Signature(s) Signed:  03/21/2022 11:11:46 AM By: Kalman Shan DO Entered By: Kalman Shan on 03/21/2022 11:08:23 -------------------------------------------------------------------------------- Problem List Details Patient Name: Date of Service: Christian Brown E. 03/21/2022 9:15 A M Medical Record Number: [id number] Patient Account Number: [id number] Date of Birth/Sex: Treating RN: Oct 20, 1965 (56 y.o. 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 03/21/2022 9:15 A M Medical Record Number: [id number] Patient Account Number: [id number] [name], [name] ([id number]) [id number] Page 5 of 8 Date of Birth/Sex: Treating RN: [birth year] (56 y.o. M) Primary Care Provider: Other Clinician: Kathlene Murphy Referring Provider: Treating Provider/Extender: [name] in Treatment: 108 Subjective Chief Complaint Information obtained from Patient 03/28/2021; Back wound History of Present Illness (HPI) Admission 03/28/2021 Mr. Kentavius Dettore is a 56 year old male with a past medical history of controlled type 2 diabetes on oral agents, obesity and OSA  that presents to the clinic for a back wound. On 01/11/2021 patient had a laminectomy with PLIF of L1-S1 by Dr. Venetia Constable because of lumbar stenosis and radiculopathy. He subsequently developed bacteremia. He had CT imaging on 10/13 of the lumbar spine that showed fluid collection in the soft tissue of the posterior L1 and S1 and was taken to the OR for washout on 10/14. 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 He received 4 Murphy of IV antibiotics by infectious disease. After his completion of 4 Murphy of IV antibiotics he was continued for an additional 4 Murphy of IV cefazolin with a stop date of 12/29. He has been evaluated by plastic surgery and no plans for surgical intervention at this time. Wife is present and reports he has been on the wound VAC for the past 8 Murphy with improvement in wound healing. He currently denies systemic signs of infection. 12/22; patient presents for follow-up. He reports no issues since last clinic visit. He denies signs of infection. He has been tolerating the wound VAC well. 12/30; patient presents for follow-up. He reports no issues and has no complaints today. He has been tolerating the wound VAC well. 1/9; patient presents for follow-up. He has no issues or complaints today. He [name] he feels well. He has had no problems with the wound VAC. 1/16; patient presents for follow-up. He continues to use the wound VAC with no issues. He denies signs of infection. 1/23; patient presents for follow-up. He has been switched from IV cefazolin to oral cefadroxil by infectious disease. He has no issues or complaints today. He denies signs of infection. He continues to tolerate the wound VAC well. 1/30; patient presents for follow-up. He continues to tolerate the wound VAC well. 2/6; patient presents for follow-up. He has no issues or complaints today. He continues to tolerate the wound VAC well. He denies signs of infection. 2/13; patient  presents for follow-up. He continues to do well with the wound VAC. He denies any issues. 2/27; patient presents for follow-up. He continues to use the wound VAC without any issues. He denies signs  of infection. 3/20; patient presents for follow-up. He has no issues or complaints today. He continues to use the wound VAC. 4/3; patient presents for follow-up. He continues to use the wound VAC without issues. He denies signs of infection. 4/17; 2-week follow-up. He continues to do well. His measurements are improved. Initially a surgical wound complicated by infection. 5/1; patient presents for follow-up. He has no issues or complaints today. He continues to tolerate the wound VAC well. He denies signs of infection. 5/15; patient presents for follow-up. He has noted more maceration to the periwound. He has been using the wound VAC without issues. He currently denies signs of infection. 5/30; patient presents for follow-up. He has been tolerating the wound VAC well over the past 2 Murphy. He no longer has maceration to the periwound. He has no issues or complaints today. 6/8; this patient with a postsurgical wound that was complicated by postop infection. He has been using silver collagen under wound VAC and gradually doing well improvement in dimensions especially the tunnel at 12:00 6/22; patient presents for follow-up. We have been using silver collagen under the wound VAC. Patient has no issues or questions today. 7/6; patient presents for follow-up. Patient continues to use collagen under the wound VAC with no issues. 7/20; patient presents for follow-up. He has been using collagen under the wound VAC with no issues. He has been approved for Epicord. This was discussed with the patient and he is in agreement to having this placed at next clinic visit. 7/27; patient presents for follow-up. He has been tolerating the wound VAC well with collagen underneath. He has been approved for epi cord and we have  this to place in office today. 8/4; patient presents for follow-up. He had Epicort placed at last clinic visit. We held off on the wound VAC. He tolerated this well. He has no issues or complaints today. 8/11; patient presents for follow-up. Epicord was placed at last clinic visit. He had the wound VAC on for the past week. He [name] that no drainage was suctioned into the canister. He has slight maceration to the periwound. 8/18; patient presents for follow-up. Epicord #3 was placed in standard fashion last week. The wound VAC was held. He has no issues or complaints today. 8/25; Patient presents for follow-up. Epicord #4 was placed in standard fashion last week. The wound VAC was held again. He has no issues or complaints today. There has been improvement in wound healing. 9/1; patient presents for follow-up. Epicort #5 was placed in standard fashion last week. He has no issues or complaints today. 9/8; patient presents for follow-up. Epicord #6 was placed in standard fashion last week. He tolerated this well and has no issues or complaints today. 9/15; Epicort No. 7 the wound looks healthy. Perhaps some hyper granulated tissue in the lower 50% of the longitudinal wound 9/22; Epicord #8 placed at last clinic visit. Wound appears well-healing. Patient has no issues or complaints today. 9/28. Epicord No. 9. Continued improvement. [name], [name] ([id number]) [id number] Page 6 of 8 10/6; patient has completed the allotted epicord Applications that insurance would cover. Insurance denied extending more applications. Patient has no issues or complaints today. He tolerated the last application well. 10/13; patient presents for follow-up. We have been using endoform to the wound bed over the past week. He has been approved for 5 more applications of Epicord. 10/20; patient presents for follow-up. Epicort was placed at last clinic visit. He has no issues or complaints today.  He  denies signs of infection. 10/27; patient presents for follow-up. Epicort was placed in standard fashion at last clinic visit. He has no issues or complaints today. He denies signs of infection. 11/3; Epicort No. 13. Tunneling depth at 12:00 is 2.5 cm as measured by myself 11/10; last patient's approved Epicort #15 was placed in standard fashion today. Tunneling is stable however has shown improvement in healing over the past several Murphy. He has no issues or complaints today. 11/17; patient presents for follow-up. He has completed his course of approved epi cord dressings. He has improved greatly in his wound healing. The tunnel is almost completely filled in. He has no issues or complaints today. 11/21; patient presents for follow-up. He has been using endoform to the wound bed. He has no issues or complaints today. 12/1; patient presents for follow-up. He has been using endoform to the wound bed. The wound is almost healed. 12/8; patient presents for follow-up. He has been using endoform to the wound bed. There still remains a small open wound. He denies signs of infection. Patient History Information obtained from Patient. Family History Diabetes - Mother, Stroke - Siblings, No family history of Cancer, Heart Disease, Hereditary Spherocytosis, Hypertension, Kidney Disease, Lung Disease, Seizures, Thyroid Problems, Tuberculosis. Social History Never smoker, Marital Status - Married, Alcohol Use - Rarely, Drug Use - No History, Caffeine Use - Rarely. Medical History Cardiovascular Patient has history of Hypertension Endocrine Patient has history of Type II Diabetes Musculoskeletal Patient has history of Osteomyelitis Medical A Surgical History Notes nd Musculoskeletal DDD Objective Constitutional respirations regular, non-labored and within target range for patient.. Vitals Time Taken: 8:42 AM, Temperature: 97.7 F, Pulse: 91 bpm, Respiratory Rate: 17 breaths/min, Blood Pressure:  151/79 mmHg. Cardiovascular 2+ dorsalis pedis/posterior tibialis pulses. Psychiatric pleasant and cooperative. General Notes: T the lumbar spine surgical incision site there is a small open wound that has minimal tunneling. Granulation tissue at the opening. No signs of o infection. Integumentary (Hair, Skin) Wound #1 status is Open. Original cause of wound was Surgical Injury. The date acquired was: 01/11/2021. The wound has been in treatment 51 Murphy. The wound is located on the Midline Back. The wound measures 0.2cm length x 0.2cm width x 0.2cm depth; 0.031cm^2 area and 0.006cm^3 volume. There is Fat Layer (Subcutaneous Tissue) exposed. There is no undermining noted, however, there is tunneling at 12:00 with a maximum distance of 0.4cm. There is a medium amount of serosanguineous drainage noted. The wound margin is distinct with the outline attached to the wound base. There is large (67-100%) pink granulation within the wound bed. There is no necrotic tissue within the wound bed. The periwound skin appearance did not exhibit: Callus, Crepitus, Excoriation, Induration, Rash, Scarring, Dry/Scaly, Maceration, Atrophie Blanche, Cyanosis, Ecchymosis, Hemosiderin Staining, Mottled, Pallor, Rubor, Erythema. Periwound temperature was noted as No Abnormality. 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 I recommended continuing with endoform. Aggressive offloading to this area. Follow-up in 2 Murphy. Plan Follow-up Appointments: Return Appointment in 2 Murphy. - Friday Dr. Heber Nicasio Anesthetic: (In clinic) Topical Lidocaine 5% applied to wound bed Cellular or Tissue Based Products: Cellular or Tissue Based Product Type: - Epicord #3 applied 11/22/21 Epicord #4  11/29/21 Epicord #5 12/06/2021 Epicord #6 12/13/21 Epicord # 7 12/20/21 Epicord # 8 12/27/21 Epicord # 9 01/03/22 Epicord # 10 01/09/22  Epicord # 11 01/24/22 Epicord # 12 01/31/22 Epicord #13 02/07/2022 Epicord #14 02/14/2022 Epicord #15 02/21/2022 Cellular or Tissue Based Product applied to wound bed, secured with steri-strips, cover with Adaptic or Mepitel. (DO NOT REMOVE). - leave the adaptic and steri-strips in place. Bathing/ Shower/ Hygiene: May shower with protection but do not get wound dressing(s) wet. Additional Orders / Instructions: Follow Nutritious Diet - Continue to monitor blood sugars daily WOUND #1: - Back Wound Laterality: Midline Peri-Wound Care: Skin Prep Every Other Day/30 Days Discharge Instructions: Use skin prep as directed Prim Dressing: Endoform 2x2 in Every Other Day/30 Days ary Discharge Instructions: Moisten with [place] Jelly lightly pack into tunnel and depth of wound. Secondary Dressing: Woven Gauze Sponge, Non-Sterile 4x4 in Every Other Day/30 Days Discharge Instructions: Apply over primary dressing as directed. Secondary Dressing: Zetuvit Plus Silicone Border Dressing 7x7(in/in) (Generic) Every Other Day/30 Days Discharge Instructions: Apply silicone border over primary dressing as directed. 1. Endoform 2. Follow-up in 2 Murphy 3. Aggressive offloading Electronic Signature(s) Signed: 03/21/2022 11:11:46 AM By: Kalman Shan DO Entered By: Kalman Shan on 03/21/2022 11:09:53 -------------------------------------------------------------------------------- HxROS Details Patient Name: Date of Service: Christian Brown E. 03/21/2022 9:15 A M Medical Record Number: [id number] Patient Account Number: [id number] Date of Birth/Sex: Treating RN: 1965-10-19 (56 y.o. M) Primary Care Provider: Kathlene Murphy Other Clinician: Referring Provider: Treating Provider/Extender: [name] in Treatment: 65 [name]  From Patient Cardiovascular Medical History: Positive for: Hypertension Endocrine Medical History: Positive for: Type II Diabetes AMARIUS, TOTO ([id number]) [id number] Page 8 of 8 Time with diabetes: Since mid 90's Treated with: Oral agents Blood sugar tested every day: Yes Tested : 2x day Musculoskeletal Medical History: Positive for: Osteomyelitis Past Medical History Notes: DDD Immunizations Pneumococcal Vaccine: Received Pneumococcal Vaccination: Yes Received Pneumococcal Vaccination On or After 60th Birthday: No Implantable Devices Yes Family and Social History Cancer: No; Diabetes: Yes - Mother; Heart Disease: No; Hereditary Spherocytosis: No; Hypertension: No; Kidney Disease: No; Lung Disease: No; Seizures: No; Stroke: Yes - Siblings; Thyroid Problems: No; Tuberculosis: No; Never smoker; Marital Status - Married; Alcohol Use: Rarely; Drug Use: No History; Caffeine Use: Rarely; Financial Concerns: No; Food, Clothing or Shelter Needs: No; Support System Lacking: No; Transportation Concerns: No Electronic Signature(s) Signed: 03/21/2022 11:11:46 AM By: Kalman Shan DO Entered By: Kalman Shan on 03/21/2022 11:06:59 -------------------------------------------------------------------------------- SuperBill Details Patient Name: Date of Service: Christian Grout. 03/21/2022 Medical Record Number: [id number] Patient Account Number: [id number] Date of Birth/Sex: Treating RN: [birth year] (56 y.o. 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 M86.9 Osteomyelitis, unspecified E11.622 Type 2 diabetes mellitus with other skin ulcer Facility Procedures : CPT4 Code: [id number] Description: 99213 - WOUND CARE VISIT-LEV 3  EST PT Modifier: Quantity: 1 Physician Procedures : CPT4 Code Description Modifier [id number] - WC PHYS LEVEL 3 - EST PT ICD-10 Diagnosis Description L98.424 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

## 2022-03-26 ENCOUNTER — Other Ambulatory Visit (HOSPITAL_COMMUNITY): Payer: Self-pay

## 2022-03-27 ENCOUNTER — Encounter: Payer: No Typology Code available for payment source | Attending: Registered Nurse | Admitting: Registered Nurse

## 2022-03-27 ENCOUNTER — Other Ambulatory Visit (HOSPITAL_COMMUNITY): Payer: Self-pay

## 2022-03-27 ENCOUNTER — Encounter: Payer: Self-pay | Admitting: Registered Nurse

## 2022-03-27 VITALS — BP 106/72 | HR 102 | Ht 74.0 in | Wt 304.0 lb

## 2022-03-27 DIAGNOSIS — M4646 Discitis, unspecified, lumbar region: Secondary | ICD-10-CM | POA: Diagnosis present

## 2022-03-27 DIAGNOSIS — G894 Chronic pain syndrome: Secondary | ICD-10-CM | POA: Insufficient documentation

## 2022-03-27 DIAGNOSIS — M7918 Myalgia, other site: Secondary | ICD-10-CM | POA: Insufficient documentation

## 2022-03-27 DIAGNOSIS — Z79891 Long term (current) use of opiate analgesic: Secondary | ICD-10-CM | POA: Diagnosis present

## 2022-03-27 DIAGNOSIS — Z5181 Encounter for therapeutic drug level monitoring: Secondary | ICD-10-CM | POA: Diagnosis not present

## 2022-03-27 MED ORDER — MORPHINE SULFATE ER 15 MG PO TBCR
15.0000 mg | EXTENDED_RELEASE_TABLET | Freq: Two times a day (BID) | ORAL | 0 refills | Status: DC
  Filled 2022-03-27: qty 60, 30d supply, fill #0

## 2022-03-27 NOTE — Progress Notes (Signed)
Subjective:    Patient ID: [name], male    DOB: [birth year], 56 y.o.   MRN: [id number]  HPI: [name] is a 56 y.o. male who returns for follow up appointment for chronic pain and medication refill. He states his pain is located in his lower back. He rates his pain 6. His current exercise regime is walking.   [name] Morphine equivalent is 30.00 MME.   Last UDS was Performed on 12/04/2021, it was consistent. .   Pain Inventory Average Pain 4 Pain Right Now 6 My pain is dull and stabbing  In the last 24 hours, has pain interfered with the following? General activity 0 Relation with others 0 Enjoyment of life 0 What TIME of day is your pain at its worst? morning  and evening Sleep (in general) Fair  Pain is worse with: walking and standing Pain improves with: rest and medication Relief from Meds: 8  Family History  Problem Relation Age of Onset   Diabetes Mother    Hypertension Neg Hx    Coronary artery disease Neg Hx    Colon cancer Neg Hx    Prostate cancer Neg Hx    Social History   Socioeconomic History   Marital status: Married    Spouse name: [name]   Number of children: 2   Years of education: Not on file   Highest education level: Not on file  Occupational History   Occupation: retired-disability    Fish farm manager: [employer], counselling: [employer]  Tobacco Use   Smoking status: Never   Smokeless tobacco: Never  Vaping Use   Vaping Use: Never used  Substance and Sexual Activity   Alcohol use: No   Drug use: No   Sexual activity: Yes  Other Topics Concern   Not on file  Social History Narrative   Remarried for the 3rd time w/ a lady for the [place]   Children:  2010 , 2012   Left handed                Social Determinants of Health   Financial Resource Strain: Not on file  Food Insecurity: Not on file  Transportation Needs: Not on file  Physical Activity: Not on file  Stress: Not on file  Social Connections:  Not on file   Past Surgical History:  Procedure Laterality Date   APPLICATION OF ROBOTIC ASSISTANCE FOR SPINAL PROCEDURE N/A 01/11/2021   Procedure: APPLICATION OF ROBOTIC ASSISTANCE [place];  Surgeon: Judith Part, MD;  Location: [place];  Service: Neurosurgery;  Laterality: N/A;   APPLICATION OF WOUND VAC N/A 01/25/2021   Procedure: APPLICATION OF WOUND VAC;  Surgeon: Dawley, Theodoro Doing, DO;  Location: [facility];  Service: Neurosurgery;  Laterality: N/A;   BACK SURGERY     COLONOSCOPY  2019   LUMBAR WOUND DEBRIDEMENT N/A 01/25/2021   Procedure: Irrigation and Debridement of lumbar wound, and wound vacuum assisted closure;  Surgeon: Dawley, Theodoro Doing, DO;  Location: [place];  Service: Neurosurgery;  Laterality: N/A;   TRANSFORAMINAL LUMBAR INTERBODY FUSION (TLIF) WITH PEDICLE SCREW FIXATION 4 LEVEL N/A 01/11/2021   Procedure: Lumbar one-two, Lumbar two-three, Lumbar three-four, Lumbar four-five, Lumbar five Sacral one Open decompression, Transforaminal lumbar interbody fusion, posterolateral instrumented fusion;  Surgeon: Judith Part, MD;  Location: [facility];  Service: Neurosurgery;  Laterality: N/A;   Past Surgical History:  Procedure Laterality Date   APPLICATION OF ROBOTIC ASSISTANCE FOR SPINAL PROCEDURE  N/A 01/11/2021   Procedure: APPLICATION OF ROBOTIC ASSISTANCE FOR SPINAL PROCEDURE;  Surgeon: Judith Part, MD;  Location: [facility];  Service: Neurosurgery;  Laterality: N/A;   APPLICATION OF WOUND VAC N/A 01/25/2021   Procedure: APPLICATION OF WOUND VAC;  Surgeon: Dawley, Theodoro Doing, DO;  Location: [place];  Service: Neurosurgery;  Laterality: N/A;   BACK SURGERY     COLONOSCOPY  2019   LUMBAR WOUND DEBRIDEMENT N/A 01/25/2021   Procedure: Irrigation and Debridement of lumbar wound, and wound vacuum assisted closure;  Surgeon: Dawley, Theodoro Doing, DO;  Location: [place];  Service: Neurosurgery;  Laterality: N/A;   TRANSFORAMINAL LUMBAR INTERBODY FUSION (TLIF) WITH PEDICLE SCREW FIXATION 4  LEVEL N/A 01/11/2021   Procedure: Lumbar one-two, Lumbar two-three, Lumbar three-four, Lumbar four-five, Lumbar five Sacral one Open decompression, Transforaminal lumbar interbody fusion, posterolateral instrumented fusion;  Surgeon: Judith Part, MD;  Location: [facility];  Service: Neurosurgery;  Laterality: N/A;   Past Medical History:  Diagnosis Date   DDD (degenerative disc disease), lumbar    Diabetes mellitus    Hyperlipemia    Hypertension    Obesity    OSA (obstructive sleep apnea)    on CPAP   BP 106/72   Pulse (!) 108   Ht '6\' 2"'$  (1.88 m)   Wt (!) 304 lb (137.9 kg)   SpO2 96%   BMI 39.03 kg/m   Opioid Risk Score:   Fall Risk Score:  `1  Depression screen [facility] 2/9     01/01/2022    9:28 AM 12/04/2021   11:08 AM 10/01/2021   10:34 AM 08/14/2021   10:43 AM 08/07/2021   12:12 PM 08/06/2021   10:31 AM 06/25/2021   10:16 AM  Depression screen [facility] 2/9  Decreased Interest 0 0 0 0 0 0 0  Down, Depressed, Hopeless 0 0 0 0 0 0 0  PHQ - 2 Score 0 0 0 0 0 0 0      Review of Systems     Objective:   Physical Exam Vitals and nursing note reviewed.  Constitutional:      Appearance: Normal appearance. He is obese.  Cardiovascular:     Rate and Rhythm: Normal rate and regular rhythm.     Pulses: Normal pulses.     Heart sounds: Normal heart sounds.  Pulmonary:     Effort: Pulmonary effort is normal.     Breath sounds: Normal breath sounds.  Musculoskeletal:     Cervical back: Normal range of motion and neck supple.     Comments: Normal Muscle Bulk and Muscle Testing Reveals:  Upper Extremities: Full ROM and Muscle Strength 5/5  Lumbar Paraspinal Tenderness: L-4-L-5 Lumbar Dressing Intact Lower Extremities: Full ROM and Muscle Strength 5/5 Arises from Table with ease Narrow Based  Gait     Skin:    General: Skin is warm and dry.  Neurological:     Mental Status: He is alert and oriented to person, place, and time.  Psychiatric:        Mood and Affect: Mood  normal.        Behavior: Behavior normal.         Assessment & Plan:  Lumbar Discitis: ID and Wound Care Following. Continue antibiotic. Continue to Monitor.  Myofascial Pain Syndrome: Continue current medication regimen. Continue to omonitor.  Chronic Pain Syndrome: Continue with Slow Weaning of MS Contin: Refill: MS Contin 15 mg one tablet every 12 hours #60. We will continue the opioid  monitoring program, this consists of regular clinic visits, examinations, urine drug screen, pill counts as well as use of [place] Controlled Substance Reporting system. A 12 month History has been reviewed on the [place] Controlled Substance Reporting System on 03/27/2022.   F/U with Dr Dagoberto Ligas in 3 months

## 2022-03-27 NOTE — Progress Notes (Signed)
Murphy, Christian ([id number]) [id number] Page 1 of 9 Visit Report for 03/21/2022 Arrival Information Details Patient Name: Date of Service: [name], [name] 03/21/2022 9:15 A M Medical Record Number: [id number] Patient Account Number: [id number] Date of Birth/Sex: Treating RN: 10/11/1965 (56 y.o. [name], [name] Primary Care Ebrahim Deremer: [name] Other Clinician: Referring Alithea Lapage: Treating Kaelum Kissick/Extender: [name] in Treatment: 61 Visit Information History Since Last Visit Added or deleted any medications: No Patient Arrived: Ambulatory Any new allergies or adverse reactions: No Arrival Time: 08:40 Had a fall or experienced change in No Accompanied By: self activities of daily living that may affect Transfer Assistance: None risk of falls: Patient Identification Verified: Yes Signs or symptoms of abuse/neglect since last visito No Secondary Verification Process Completed: Yes Hospitalized since last visit: No Patient Requires Transmission-Based Precautions: No Implantable device outside of the clinic excluding No Patient Has Alerts: Yes cellular tissue based products placed in the center Patient Alerts: Patient on Blood Thinner since last visit: No BP Right Arm-PICC Has Dressing in Place as Prescribed: Yes Pain Present Now: No Electronic Signature(s) Signed: 03/27/2022 5:16:09 PM By: [name] RN Entered By: [name] on 03/21/2022 08:40:52 -------------------------------------------------------------------------------- Clinic Level of Care Assessment Details Patient Name: Date of Service: [name], [name] 03/21/2022 9:15 A M Medical Record Number: [id number] Patient Account Number: [id number] Date of Birth/Sex: Treating RN: 01/13/1966 (56 y.o. [name] Primary Care Dontavious Emily: [name] Other Clinician: Referring Theotis Gerdeman: Treating Monique Hefty/Extender: [name] Weeks in Treatment:  [place] Assessment Items TOOL 4 Quantity Score X- 1 0 Use when only an EandM is performed on FOLLOW-UP visit ASSESSMENTS - Nursing Assessment / Reassessment X- 1 10 Reassessment of Co-morbidities (includes updates in patient status) X- 1 5 Reassessment of Adherence to Treatment Plan ASSESSMENTS - Wound and Skin A ssessment / Reassessment X - Simple Wound Assessment / Reassessment - one wound 1 5 _0  - 0 Complex Wound Assessment / Reassessment - multiple wounds X- 1 10 Dermatologic / Skin Assessment (not related to wound area) ASSESSMENTS - Focused Assessment _1  - 0 Circumferential Edema Measurements - multi extremities _2  - 0 Nutritional Assessment / Counseling / Intervention TRUST, LEH ([id number]) [id number] Page 2 of 9 _3  - 0 Lower Extremity Assessment (monofilament, tuning fork, pulses) _4  - 0 Peripheral Arterial Disease Assessment (using hand held doppler) ASSESSMENTS - Ostomy and/or Continence Assessment and Care _5  - 0 Incontinence Assessment and Management _6  - 0 Ostomy Care Assessment and Management (repouching, etc.) PROCESS - Coordination of Care X - Simple Patient / Family Education for ongoing care 1 15 _7  - 0 Complex (extensive) Patient / Family Education for ongoing care X- 1 10 Staff obtains Programmer, systems, Records, T Results / Process Orders est _8  - 0 Staff telephones HHA, Nursing Homes / Clarify orders / etc _9  - 0 Routine Transfer to another Facility (non-emergent condition) _10  - 0 Routine Hospital Admission (non-emergent condition) _11  - 0 New Admissions / Biomedical engineer / Ordering NPWT Apligraf, etc. , _12  - 0 Emergency Hospital Admission (emergent condition) X- 1 10 Simple Discharge Coordination _13  - 0 Complex (extensive) Discharge Coordination PROCESS - Special Needs _14  - 0 Pediatric / Minor Patient Management _15  - 0 Isolation Patient Management _16  - 0 Hearing / Language / Visual special  needs _17  - 0 Assessment of Community assistance (transportation, D/C planning, etc.) _18  - 0 Additional assistance / Altered mentation _19  - 0 Support Surface(s) Assessment (bed, cushion, seat, etc.)  INTERVENTIONS - Wound Cleansing / Measurement X - Simple Wound Cleansing - one wound 1 5 _0  - 0 Complex Wound Cleansing - multiple wounds X- 1 5 Wound Imaging (photographs - any number of wounds) _1  - 0 Wound Tracing (instead of photographs) X- 1 5 Simple Wound Measurement - one wound _2  - 0 Complex Wound Measurement - multiple wounds INTERVENTIONS - Wound Dressings X - Small Wound Dressing one or multiple wounds 1 10 _3  - 0 Medium Wound Dressing one or multiple wounds _4  - 0 Large Wound Dressing one or multiple wounds <YBOFBPZWCHENIDPO>_2<\/UMPNTIRWERXVQMGQ>_6  - 0 Application of Medications - topical <PYPPJKDTOIZTIWPY>_0<\/DXIPJASNKNLZJQBH>_4  - 0 Application of Medications - injection INTERVENTIONS - Miscellaneous _7  - 0 External ear exam _8  - 0 Specimen Collection (cultures, biopsies, blood, body fluids, etc.) _9  - 0 Specimen(s) / Culture(s) sent or taken to Lab for analysis _10  - 0 Patient Transfer (multiple staff / Civil Service fast streamer / Similar devices) _11  - 0 Simple Staple / Suture removal (25 or less) _12  - 0 Complex Staple / Suture removal (26 or more) _13  - 0 Hypo / Hyperglycemic Management (close monitor of Blood Glucose) DREZDEN, SEITZINGER ([id number]) [id number] Page 3 of 9 _14  - 0 Ankle / Brachial Index (ABI) - do not check if billed separately X- 1 5 Vital Signs Has the patient been seen at the hospital within the last three years: Yes Total Score: 95 Level Of Care: New/Established - Level 3 Electronic Signature(s) Signed: 03/21/2022 3:28:23 PM By: Deon Pilling RN, BSN Entered By: Deon Pilling on 03/21/2022 10:18:49 -------------------------------------------------------------------------------- Encounter Discharge Information Details Patient Name: Date of Service: Christian Grout. 03/21/2022 9:15 A M Medical Record  Number: [id number] Patient Account Number: [id number] Date of Birth/Sex: Treating RN: 1965-08-27 (56 y.o. [name] Primary Care Aracelie Addis: [name] Other Clinician: Referring Wana Mount: Treating Akai Dollard/Extender: [name] Weeks in Treatment: 72 Encounter Discharge Information Items Discharge Condition: Stable Ambulatory Status: Ambulatory Discharge Destination: Home Transportation: Private Auto Accompanied By: self Schedule Follow-up Appointment: Yes Clinical Summary of Care: Electronic Signature(s) Signed: 03/21/2022 3:28:23 PM By: Deon Pilling RN, BSN Entered By: Deon Pilling on 03/21/2022 10:19:57 -------------------------------------------------------------------------------- Lower Extremity Assessment Details Patient Name: Date of Service: Christian Brown E. 03/21/2022 9:15 A M Medical Record Number: [id number] Patient Account Number: [id number] Date of Birth/Sex: Treating RN: 1965-08-01 (56 y.o. [name] Primary Care Madailein Londo: [name] Other Clinician: Referring Oden Lindaman: Treating Tierany Appleby/Extender: [name] Weeks in Treatment: 51 Electronic Signature(s) Signed: 03/27/2022 5:16:09 PM By: [name] RN Entered By: [name] on 03/21/2022 08:43:01 -------------------------------------------------------------------------------- Multi Wound Chart Details Patient Name: Date of Service: Christian Brown E. 03/21/2022 9:15 A M Medical Record Number: [id number] Patient Account Number: [id number] ALDRICK, DERRIG ([id number]) [phone].pdf Page 4 of 9 Date of Birth/Sex: Treating RN: 09/24/1965 (56 y.o. M) Primary Care Olis Viverette: Other Clinician: [name] Referring Chinedu Agustin: Treating Edia Pursifull/Extender: [name] Weeks in Treatment: 76 Vital Signs Height(in): Pulse(bpm): 91 Weight(lbs): Blood Pressure(mmHg): 151/79 Body Mass Index(BMI): Temperature(F): 97.7 Respiratory  Rate(breaths/min): 17 [1:Photos:] [N/A:N/A] Midline Back N/A N/A Wound Location: Surgical Injury N/A N/A Wounding Event: Open Surgical Wound N/A N/A Primary Etiology: Hypertension, Type II Diabetes, N/A N/A Comorbid History: Osteomyelitis 01/11/2021 N/A N/A Date Acquired: 56 N/A N/A Weeks of Treatment: Open N/A N/A Wound Status: No N/A N/A Wound Recurrence: 0.2x0.2x0.2 N/A N/A Measurements L x W x D (cm) 0.031 N/A N/A A (cm) : rea 0.006 N/A N/A Volume (cm) : 100.00% N/A N/A % Reduction in  A rea: 100.00% N/A N/A % Reduction in Volume: 12 Position 1 (o'clock): 0.4 Maximum Distance 1 (cm): Yes N/A N/A Tunneling: Full Thickness With Exposed Support N/A N/A Classification: Structures Medium N/A N/A Exudate Amount: Serosanguineous N/A N/A Exudate Type: red, brown N/A N/A Exudate Color: Distinct, outline attached N/A N/A Wound Margin: Large (67-100%) N/A N/A Granulation Amount: Pink N/A N/A Granulation Quality: None Present (0%) N/A N/A Necrotic Amount: Fat Layer (Subcutaneous Tissue): Yes N/A N/A Exposed Structures: Fascia: No Tendon: No Muscle: No Joint: No Bone: No Large (67-100%) N/A N/A Epithelialization: Excoriation: No N/A N/A Periwound Skin Texture: Induration: No Callus: No Crepitus: No Rash: No Scarring: No Maceration: No N/A N/A Periwound Skin Moisture: Dry/Scaly: No Atrophie Blanche: No N/A N/A Periwound Skin Color: Cyanosis: No Ecchymosis: No Erythema: No Hemosiderin Staining: No Mottled: No Pallor: No Rubor: No No Abnormality N/A N/A Temperature: Treatment Notes Wound #1 (Back) Wound Laterality: Midline Cleanser Peri-Wound Care VINCENZO, STAVE ([id number]) [id number] Page 5 of 9 Skin Prep Discharge Instruction: Use skin prep as directed Topical Primary Dressing Endoform 2x2 in Discharge Instruction: Moisten with [place] Jelly lightly pack into tunnel and depth of wound. Secondary Dressing Woven  Gauze Sponge, Non-Sterile 4x4 in Discharge Instruction: Apply over primary dressing as directed. Zetuvit Plus Silicone Border Dressing 7x7(in/in) Discharge Instruction: Apply silicone border over primary dressing as directed. Secured With Compression Wrap Compression Stockings Add-Ons Electronic Signature(s) Signed: 03/21/2022 11:11:46 AM By: Kalman Shan DO Entered By: Kalman Shan on 03/21/2022 11:06:02 -------------------------------------------------------------------------------- Multi-Disciplinary Care Plan Details Patient Name: Date of Service: Christian Grout. 03/21/2022 9:15 A M Medical Record Number: [id number] Patient Account Number: [id number] Date of Birth/Sex: Treating RN: 07-Mar-1966 (56 y.o. [name] Primary Care Emika Tiano: [name] Other Clinician: Referring Zarin Knupp: Treating Sokha Craker/Extender: [name] Weeks in Treatment: [place] reviewed with physician Active Inactive Wound/Skin Impairment Nursing Diagnoses: Impaired tissue integrity Goals: Patient/caregiver will verbalize understanding of skin care regimen Date Initiated: 03/28/2021 Target Resolution Date: 05/16/2022 Goal Status: Active Ulcer/skin breakdown will have a volume reduction of 30% by week 4 Date Initiated: 03/28/2021 Date Inactivated: 05/27/2021 Target Resolution Date: 05/31/2021 Goal Status: Met Interventions: Assess patient/caregiver ability to obtain necessary supplies Assess patient/caregiver ability to perform ulcer/skin care regimen upon admission and as needed Assess ulceration(s) every visit Provide education on ulcer and skin care Treatment Activities: Topical wound management initiated : 03/28/2021 Notes: Electronic Signature(s) SHEIKH, LEVERICH ([id number]) [phone].pdf Page 6 of 9 Signed: 03/21/2022 3:28:23 PM By: Deon Pilling RN, BSN Entered By: Deon Pilling on 03/21/2022  08:59:55 -------------------------------------------------------------------------------- Pain Assessment Details Patient Name: Date of Service: Christian Brown E. 03/21/2022 9:15 A M Medical Record Number: [id number] Patient Account Number: [id number] Date of Birth/Sex: Treating RN: 04-01-1966 (56 y.o. [name] Primary Care Etosha Wetherell: [name] Other Clinician: Referring Sondi Desch: Treating [place] Dalporto/Extender: [name] Weeks in Treatment: 20 Active Problems Location of Pain Severity and Description of Pain Patient Has Paino Yes Site Locations Pain Location: Pain in Ulcers With Dressing Change: Yes Duration of the Pain. Constant / Intermittento Intermittent Rate the pain. Current Pain Level: 5 Worst Pain Level: 10 Least Pain Level: 0 Tolerable Pain Level: 5 Character of Pain Describe the Pain: Aching Pain Management and Medication Current Pain Management: Medication: No Cold Application: No Rest: No Massage: No Activity: No T.MurphyN.S.: No Heat Application: No Leg drop or elevation: No Is the Current Pain Management Adequate: Adequate How does your wound impact your activities of daily livingo Sleep:  No Bathing: No Appetite: No Relationship With Others: No Bladder Continence: No Emotions: No Bowel Continence: No Work: No Toileting: No Drive: No Dressing: No Hobbies: No Electronic Signature(s) Signed: 03/27/2022 5:16:09 PM By: [name] RN Entered By: [name] on 03/21/2022 08:42:52 Romulus, Ofilia Neas ([id number]) [id number] Page 7 of 9 -------------------------------------------------------------------------------- Patient/Caregiver Education Details Patient Name: Date of Service: SAYAN, ALDAVA 12/8/2023andnbsp9:15 A M Medical Record Number: [id number] Patient Account Number: [id number] Date of Birth/Gender: Treating RN: Oct 07, 1965 (55 y.o. [name] Primary Care Physician: [name] Other  Clinician: Referring Physician: Treating Physician/Extender: [name] in Treatment: 22 Education Assessment Education Provided To: Patient Education Topics Provided Wound/Skin Impairment: Handouts: Skin Care Do's and Dont's Methods: Explain/Verbal Responses: Reinforcements needed Electronic Signature(s) Signed: 03/21/2022 3:28:23 PM By: Deon Pilling RN, BSN Entered By: Deon Pilling on 03/21/2022 09:00:08 -------------------------------------------------------------------------------- Wound Assessment Details Patient Name: Date of Service: Christian Grout. 03/21/2022 9:15 A M Medical Record Number: [id number] Patient Account Number: [id number] Date of Birth/Sex: Treating RN: [birth year] (56 y.o. [name] Primary Care Nyellie Yetter: [name] Other Clinician: Referring Lenore Moyano: Treating Amor Packard/Extender: [name] Weeks in Treatment: 51 Wound Status Wound Number: 1 Primary Etiology: Open Surgical Wound Wound Location: Midline Back Wound Status: Open Wounding Event: Surgical Injury Comorbid History: Hypertension, Type II Diabetes, Osteomyelitis Date Acquired: 01/11/2021 Weeks Of Treatment: 51 Clustered Wound: No Photos Wound Measurements Length: (cm) 0.2 Width: (cm) 0.2 Depth: (cm) 0.2 Area: (cm) 0.03 Volume: (cm) 0.00 Gutmann, Braidan E ([id number]) % Reduction in Area: 100% % Reduction in Volume: 100% Epithelialization: Large (67-100%) 1 Tunneling: Yes 6 Position (o'clock): 12 Maximum Distance: (cm) 0.4 [id number] Page 8 of 9 Undermining: No Wound Description Classification: Full Thickness With Exposed Suppo Wound Margin: Distinct, outline attached Exudate Amount: Medium Exudate Type: Serosanguineous Exudate Color: red, brown rt Structures Foul Odor After Cleansing: No Slough/Fibrino No Wound Bed Granulation Amount: Large (67-100%) Exposed Structure Granulation Quality: Pink Fascia Exposed:  No Necrotic Amount: None Present (0%) Fat Layer (Subcutaneous Tissue) Exposed: Yes Tendon Exposed: No Muscle Exposed: No Joint Exposed: No Bone Exposed: No Periwound Skin Texture Texture Color No Abnormalities Noted: No No Abnormalities Noted: No Callus: No Atrophie Blanche: No Crepitus: No Cyanosis: No Excoriation: No Ecchymosis: No Induration: No Erythema: No Rash: No Hemosiderin Staining: No Scarring: No Mottled: No Pallor: No Moisture Rubor: No No Abnormalities Noted: No Dry / Scaly: No Temperature / Pain Maceration: No Temperature: No Abnormality Treatment Notes Wound #1 (Back) Wound Laterality: Midline Cleanser Peri-Wound Care Skin Prep Discharge Instruction: Use skin prep as directed Topical Primary Dressing Endoform 2x2 in Discharge Instruction: Moisten with [place] Jelly lightly pack into tunnel and depth of wound. Secondary Dressing Woven Gauze Sponge, Non-Sterile 4x4 in Discharge Instruction: Apply over primary dressing as directed. Zetuvit Plus Silicone Border Dressing 7x7(in/in) Discharge Instruction: Apply silicone border over primary dressing as directed. Secured With Compression Wrap Compression Stockings Environmental education officer) Signed: 03/21/2022 3:28:23 PM By: Deon Pilling RN, BSN Entered By: Deon Pilling on 03/21/2022 [place], [place] ([id number]) [id number] Page 9 of 9 -------------------------------------------------------------------------------- Vitals Details Patient Name: Date of Service: DEUCE, PATERNOSTER 03/21/2022 9:15 A M Medical Record Number: [id number] Patient Account Number: [id number] Date of Birth/Sex: Treating RN: 09/07/1965 (56 y.o. [name] Primary Care Albertina Leise: [name] Other Clinician: Referring Lian Pounds: Treating Nakeem Murnane/Extender: [name] Weeks in Treatment: 26 Vital Signs Time Taken: 08:42 Temperature (F): 97.7 Pulse (bpm): 91 Respiratory  Rate (breaths/min): 17  Blood Pressure (mmHg): 151/79 Reference Range: 80 - 120 mg / dl Electronic Signature(s) Signed: 03/27/2022 5:16:09 PM By: [name] RN Entered By: [name] on 03/21/2022 76:54:65

## 2022-03-28 ENCOUNTER — Telehealth: Payer: Self-pay

## 2022-04-01 ENCOUNTER — Other Ambulatory Visit: Payer: Self-pay

## 2022-04-01 MED ORDER — TADALAFIL 20 MG PO TABS: 10.0000 mg | ORAL_TABLET | ORAL | 3 refills | Status: DC | PRN

## 2022-04-03 ENCOUNTER — Encounter (HOSPITAL_BASED_OUTPATIENT_CLINIC_OR_DEPARTMENT_OTHER): Payer: No Typology Code available for payment source | Admitting: Internal Medicine

## 2022-04-03 DIAGNOSIS — M869 Osteomyelitis, unspecified: Secondary | ICD-10-CM | POA: Diagnosis not present

## 2022-04-03 DIAGNOSIS — E11622 Type 2 diabetes mellitus with other skin ulcer: Secondary | ICD-10-CM | POA: Diagnosis not present

## 2022-04-03 DIAGNOSIS — L98424 Non-pressure chronic ulcer of back with necrosis of bone: Secondary | ICD-10-CM

## 2022-04-08 ENCOUNTER — Other Ambulatory Visit (HOSPITAL_COMMUNITY): Payer: Self-pay

## 2022-04-17 NOTE — Progress Notes (Signed)
CHANCEY, RINGEL ([id number]) [id number] Page 1 of 7 Visit Report for 02/07/2022 Arrival Information Details Patient Name: Date of Service: [name], [name] 02/07/2022 9:15 A M Medical Record Number: [id number] Patient Account Number: [id number] Date of Birth/Sex: Treating RN: 04/27/1965 (57 y.o. [name], [name] Primary Care Carinna Newhart: [name] Other Clinician: Referring Terriana Barreras: Treating Janeka Libman/Extender: [name] Weeks in Treatment: 40 Visit Information History Since Last Visit Added or deleted any medications: No Patient Arrived: Ambulatory Any new allergies or adverse reactions: No Arrival Time: 09:33 Had a fall or experienced change in No Accompanied By: self activities of daily living that may affect Transfer Assistance: None risk of falls: Patient Identification Verified: Yes Signs or symptoms of abuse/neglect since last visito No Secondary Verification Process Completed: Yes Hospitalized since last visit: No Patient Requires Transmission-Based Precautions: No Implantable device outside of the clinic excluding No Patient Has Alerts: Yes cellular tissue based products placed in the center Patient Alerts: Patient on Blood Thinner since last visit: No BP Right Arm-PICC Has Dressing in Place as Prescribed: Yes Pain Present Now: No Electronic Signature(s) Signed: 04/16/2022 5:36:43 PM By: [name] RN Entered By: [name] on 02/07/2022 09:33:37 -------------------------------------------------------------------------------- Encounter Discharge Information Details Patient Name: Date of Service: Christian Grout. 02/07/2022 9:15 A M Medical Record Number: [id number] Patient Account Number: [id number] Date of Birth/Sex: Treating RN: 1965/10/19 (57 y.o. [name] Primary Care Adom Schoeneck: [name] Other Clinician: Referring Lenis Nettleton: Treating Idalis Hoelting/Extender: [name] Weeks in Treatment:  45 Encounter Discharge Information Items Post Procedure Vitals Discharge Condition: Stable Temperature (F): 97.7 Ambulatory Status: Ambulatory Pulse (bpm): 74 Discharge Destination: Home Respiratory Rate (breaths/min): 20 Transportation: Private Auto Blood Pressure (mmHg): 157/74 Accompanied By: self Schedule Follow-up Appointment: Yes Clinical Summary of Care: Electronic Signature(s) Signed: 02/07/2022 1:57:45 PM By: Deon Pilling RN, BSN Entered By: Deon Pilling on 02/07/2022 10:16:00 Rolly Salter ([id number]) [id number] Page 2 of 7 -------------------------------------------------------------------------------- Lower Extremity Assessment Details Patient Name: Date of Service: [name], [name] 02/07/2022 9:15 A M Medical Record Number: [id number] Patient Account Number: [id number] Date of Birth/Sex: Treating RN: December 11, 1965 (56 y.o. [name] Primary Care Janeese Mcgloin: [name] Other Clinician: Referring Jovonne Wilton: Treating Kaden Daughdrill/Extender: [name] Weeks in Treatment: 45 Electronic Signature(s) Signed: 04/16/2022 5:36:43 PM By: [name] RN Entered By: [name] on 02/07/2022 09:34:13 -------------------------------------------------------------------------------- Multi Wound Chart Details Patient Name: Date of Service: Christian Grout. 02/07/2022 9:15 A M Medical Record Number: [id number] Patient Account Number: [id number] Date of Birth/Sex: Treating RN: 09-01-1965 (57 y.o. M) Primary Care Latamara Melder: [name] Other Clinician: Referring Rodnesha Elie: Treating Makeda Peeks/Extender: [name] Weeks in Treatment: 45 Vital Signs Height(in): Pulse(bpm): 74 Weight(lbs): Blood Pressure(mmHg): 157/74 Body Mass Index(BMI): Temperature(F): 97.7 Respiratory Rate(breaths/min): 17 [1:Photos:] [N/A:N/A] Midline Back N/A N/A Wound Location: Surgical Injury N/A N/A Wounding Event: Open Surgical Wound  N/A N/A Primary Etiology: Hypertension, Type II Diabetes, N/A N/A Comorbid History: Osteomyelitis 01/11/2021 N/A N/A Date Acquired: 33 N/A N/A Weeks of Treatment: Open N/A N/A Wound Status: No N/A N/A Wound Recurrence: 3.7x0.4x0.3 N/A N/A Measurements L x W x D (cm) 1.162 N/A N/A A (cm) : rea 0.349 N/A N/A Volume (cm) : 98.50% N/A N/A % Reduction in A rea: 99.80% N/A N/A % Reduction in Volume: 12 Position 1 (o'clock): 5 Maximum Distance 1 (cm): Yes N/A N/A Tunneling: Full Thickness With Exposed Support N/A N/A Classification: Structures Medium N/A N/A Exudate Amount: Serosanguineous N/A N/A Exudate Type: red,  brown N/A N/A Exudate Color: [name], [name] ([id number]) [id number] Page 3 of 7 Distinct, outline attached N/A N/A Wound Margin: Large (67-100%) N/A N/A Granulation Amount: Red N/A N/A Granulation Quality: None Present (0%) N/A N/A Necrotic Amount: Fat Layer (Subcutaneous Tissue): Yes N/A N/A Exposed Structures: Fascia: No Tendon: No Muscle: No Joint: No Bone: No Large (67-100%) N/A N/A Epithelialization: Excoriation: No N/A N/A Periwound Skin Texture: Induration: No Callus: No Crepitus: No Rash: No Scarring: No Maceration: No N/A N/A Periwound Skin Moisture: Dry/Scaly: No Atrophie Blanche: No N/A N/A Periwound Skin Color: Cyanosis: No Ecchymosis: No Erythema: No Hemosiderin Staining: No Mottled: No Pallor: No Rubor: No No Abnormality N/A N/A Temperature: Cellular or Tissue Based Product N/A N/A Procedures Performed: Treatment Notes Wound #1 (Back) Wound Laterality: Midline Cleanser Peri-Wound Care Skin Prep Discharge Instruction: Use skin prep as directed Topical Primary Dressing Secondary Dressing ADAPTIC TOUCH 3x4.25 in Discharge Instruction: Applied over primary dressing secured with steri-strips as directed. Woven Gauze Sponge, Non-Sterile 4x4 in Discharge Instruction: Apply over primary  dressing as directed. Zetuvit Plus Silicone Border Dressing 7x7(in/in) Discharge Instruction: Apply silicone border over primary dressing as directed. Secured With Compression Wrap Compression Stockings Environmental education officer) Signed: 02/07/2022 12:14:36 PM By: Kalman Shan DO Entered By: Kalman Shan on 02/07/2022 11:14:07 -------------------------------------------------------------------------------- Multi-Disciplinary Care Plan Details Patient Name: Date of Service: Christian Grout. 02/07/2022 9:15 A M Medical Record Number: [id number] Patient Account Number: [id number] Date of Birth/Sex: Treating RN: 02-15-1966 (57 y.o. [name] Primary Care Lina Hitch: [name] Other Clinician: Referring Kristena Wilhelmi: Treating Ewel Lona/Extender: [name] [place], DAEJON LICH ([id number]) [id number] Page 4 of 7 Weeks in Treatment: [place] reviewed with physician Active Inactive Wound/Skin Impairment Nursing Diagnoses: Impaired tissue integrity Goals: Patient/caregiver will verbalize understanding of skin care regimen Date Initiated: 03/28/2021 Target Resolution Date: 04/11/2022 Goal Status: Active Ulcer/skin breakdown will have a volume reduction of 30% by week 4 Date Initiated: 03/28/2021 Date Inactivated: 05/27/2021 Target Resolution Date: 05/31/2021 Goal Status: Met Interventions: Assess patient/caregiver ability to obtain necessary supplies Assess patient/caregiver ability to perform ulcer/skin care regimen upon admission and as needed Assess ulceration(s) every visit Provide education on ulcer and skin care Treatment Activities: Topical wound management initiated : 03/28/2021 Notes: Electronic Signature(s) Signed: 02/07/2022 1:57:45 PM By: Deon Pilling RN, BSN Entered By: Deon Pilling on 02/07/2022 09:42:00 -------------------------------------------------------------------------------- Pain  Assessment Details Patient Name: Date of Service: Christian Grout. 02/07/2022 9:15 A M Medical Record Number: [id number] Patient Account Number: [id number] Date of Birth/Sex: Treating RN: 1965/09/24 (57 y.o. [name] Primary Care Clora Ohmer: [name] Other Clinician: Referring Maron Stanzione: Treating Joelle [place]/Extender: [name] Weeks in Treatment: 45 Active Problems Location of Pain Severity and Description of Pain Patient Has Paino No Site Locations [name], [name] ([id number]) [phone].pdf Page 5 of 7 Pain Management and Medication Current Pain Management: Electronic Signature(s) Signed: 04/16/2022 5:36:43 PM By: [name] RN Entered By: [name] on 02/07/2022 09:34:06 -------------------------------------------------------------------------------- Patient/Caregiver Education Details Patient Name: Date of Service: Christian Grout 10/27/2023andnbsp9:15 A M Medical Record Number: [id number] Patient Account Number: [id number] Date of Birth/Gender: Treating RN: 08-12-1965 (57 y.o. [name] Primary Care Physician: [name] Other Clinician: Referring Physician: Treating Physician/Extender: [name] in Treatment: 48 Education Assessment Education Provided To: Patient Education Topics Provided Offloading: Handouts: What is Offloadingo Methods: Explain/Verbal Responses: Reinforcements needed Electronic Signature(s) Signed: 02/07/2022 1:57:45 PM By: Deon Pilling RN, BSN Entered By: Deon Pilling on 02/07/2022 09:42:13 --------------------------------------------------------------------------------  Wound Assessment Details Patient Name: Date of Service: [name], [name] 02/07/2022 9:15 A M Medical Record Number: [id number] Patient Account Number: [id number] Date of Birth/Sex: Treating RN: [birth year] (57 y.o. [name] Primary Care Margarett Viti: [name] Other Clinician: Referring  Kaleab Frasier: Treating Evella Kasal/Extender: [name] Weeks in Treatment: 45 Wound Status Wound Number: 1 Primary Etiology: Open Surgical Wound Wound Location: Midline Back Wound Status: Open Wounding Event: Surgical Injury Comorbid History: Hypertension, Type II Diabetes, Osteomyelitis Date Acquired: 01/11/2021 Weeks Of Treatment: 45 Clustered Wound: No Photos [name], [name] ([id number]) [id number] Page 6 of 7 Wound Measurements Length: (cm) 3.7 Width: (cm) 0.4 Depth: (cm) 0.3 Area: (cm) 1.162 Volume: (cm) 0.349 % Reduction in Area: 98.5% % Reduction in Volume: 99.8% Epithelialization: Large (67-100%) Tunneling: Yes Position (o'clock): 12 Maximum Distance: (cm) 5 Undermining: No Wound Description Classification: Full Thickness With Exposed Suppo Wound Margin: Distinct, outline attached Exudate Amount: Medium Exudate Type: Serosanguineous Exudate Color: red, brown rt Structures Foul Odor After Cleansing: No Slough/Fibrino Yes Wound Bed Granulation Amount: Large (67-100%) Exposed Structure Granulation Quality: Red Fascia Exposed: No Necrotic Amount: None Present (0%) Fat Layer (Subcutaneous Tissue) Exposed: Yes Tendon Exposed: No Muscle Exposed: No Joint Exposed: No Bone Exposed: No Periwound Skin Texture Texture Color No Abnormalities Noted: No No Abnormalities Noted: No Callus: No Atrophie Blanche: No Crepitus: No Cyanosis: No Excoriation: No Ecchymosis: No Induration: No Erythema: No Rash: No Hemosiderin Staining: No Scarring: No Mottled: No Pallor: No Moisture Rubor: No No Abnormalities Noted: No Dry / Scaly: No Temperature / Pain Maceration: No Temperature: No Abnormality Electronic Signature(s) Signed: 04/16/2022 5:36:43 PM By: [name] RN Entered By: [name] on 02/07/2022 09:42:02 -------------------------------------------------------------------------------- Vitals Details Patient  Name: Date of Service: Christian Brown E. 02/07/2022 9:15 A M Medical Record Number: [id number] Patient Account Number: [id number] Date of Birth/Sex: Treating RN: 03-20-1966 (57 y.o. [name] Primary Care Bianco Cange: [name] Other Clinician: Referring Kennidy Lamke: Treating Marixa Mellott/Extender: [name] in Treatment: [address], [place] ([id number]) [id number] Page 7 of 7 Vital Signs Time Taken: 09:33 Temperature (F): 97.7 Pulse (bpm): 74 Respiratory Rate (breaths/min): 17 Blood Pressure (mmHg): 157/74 Reference Range: 80 - 120 mg / dl Electronic Signature(s) Signed: 04/16/2022 5:36:43 PM By: [name] RN Entered By: [name] on 02/07/2022 09:34:01

## 2022-04-17 NOTE — Progress Notes (Signed)
ANGELINO, RUMERY ([id number]) [id number] Page 1 of 8 Visit Report for 04/03/2022 Arrival Information Details Patient Name: Date of Service: [name], [name] 04/03/2022 9:15 A M Medical Record Number: [id number] Patient Account Number: [id number] Date of Birth/Sex: Treating RN: 12/17/1965 (57 y.o. [name] Primary Care Kenley Rettinger: [name] Other Clinician: Referring Donyea Gafford: Treating Manson Luckadoo/Extender: [name] in Treatment: 67 Visit Information History Since Last Visit Added or deleted any medications: No Patient Arrived: Ambulatory Any new allergies or adverse reactions: No Arrival Time: 09:17 Had a fall or experienced change in No Accompanied By: self activities of daily living that may affect Transfer Assistance: None risk of falls: Patient Identification Verified: Yes Signs or symptoms of abuse/neglect since last visito No Secondary Verification Process Completed: Yes Hospitalized since last visit: No Patient Requires Transmission-Based Precautions: No Implantable device outside of the clinic excluding No Patient Has Alerts: Yes cellular tissue based products placed in the center Patient Alerts: Patient on Blood Thinner since last visit: No BP Right Arm-PICC Has Dressing in Place as Prescribed: Yes Pain Present Now: No Electronic Signature(s) Signed: 04/03/2022 6:15:21 PM By: Deon Pilling RN, BSN Entered By: Deon Pilling on 04/03/2022 09:17:48 -------------------------------------------------------------------------------- Clinic Level of Care Assessment Details Patient Name: Date of Service: [name], [name] 04/03/2022 9:15 A M Medical Record Number: [id number] Patient Account Number: [id number] Date of Birth/Sex: Treating RN: [birth year] (58 y.o. [name], [name] Primary Care Usbaldo Pannone: [name] Other Clinician: Referring Symon Norwood: Treating Jovonta Levit/Extender: [name] Weeks in Treatment:  53 Clinic Level of Care Assessment Items TOOL 4 Quantity Score X- 1 0 Use when only an EandM is performed on FOLLOW-UP visit ASSESSMENTS - Nursing Assessment / Reassessment X- 1 10 Reassessment of Co-morbidities (includes updates in patient status) X- 1 5 Reassessment of Adherence to Treatment Plan ASSESSMENTS - Wound and Skin A ssessment / Reassessment X - Simple Wound Assessment / Reassessment - one wound 1 5 '[]'$  - 0 Complex Wound Assessment / Reassessment - multiple wounds '[]'$  - 0 Dermatologic / Skin Assessment (not related to wound area) ASSESSMENTS - Focused Assessment '[]'$  - 0 Circumferential Edema Measurements - multi extremities '[]'$  - 0 Nutritional Assessment / Counseling / Intervention YI, HAUGAN ([id number]) [id number] Page 2 of 8 '[]'$  - 0 Lower Extremity Assessment (monofilament, tuning fork, pulses) '[]'$  - 0 Peripheral Arterial Disease Assessment (using hand held doppler) ASSESSMENTS - Ostomy and/or Continence Assessment and Care '[]'$  - 0 Incontinence Assessment and Management '[]'$  - 0 Ostomy Care Assessment and Management (repouching, etc.) PROCESS - Coordination of Care X - Simple Patient / Family Education for ongoing care 1 15 '[]'$  - 0 Complex (extensive) Patient / Family Education for ongoing care X- 1 10 Staff obtains Programmer, systems, Records, T Results / Process Orders est '[]'$  - 0 Staff telephones HHA, Nursing Homes / Clarify orders / etc '[]'$  - 0 Routine Transfer to another Facility (non-emergent condition) '[]'$  - 0 Routine Hospital Admission (non-emergent condition) '[]'$  - 0 New Admissions / Biomedical engineer / Ordering NPWT Apligraf, etc. , '[]'$  - 0 Emergency Hospital Admission (emergent condition) X- 1 10 Simple Discharge Coordination '[]'$  - 0 Complex (extensive) Discharge Coordination PROCESS - Special Needs '[]'$  - 0 Pediatric / Minor Patient Management '[]'$  - 0 Isolation Patient Management '[]'$  - 0 Hearing / Language / Visual special  needs '[]'$  - 0 Assessment of Community assistance (transportation, D/C planning, etc.) '[]'$  - 0 Additional assistance / Altered mentation '[]'$  - 0 Support Surface(s) Assessment (bed, cushion, seat,  etc.) INTERVENTIONS - Wound Cleansing / Measurement X - Simple Wound Cleansing - one wound 1 5 '[]'$  - 0 Complex Wound Cleansing - multiple wounds X- 1 5 Wound Imaging (photographs - any number of wounds) '[]'$  - 0 Wound Tracing (instead of photographs) X- 1 5 Simple Wound Measurement - one wound '[]'$  - 0 Complex Wound Measurement - multiple wounds INTERVENTIONS - Wound Dressings X - Small Wound Dressing one or multiple wounds 1 10 '[]'$  - 0 Medium Wound Dressing one or multiple wounds '[]'$  - 0 Large Wound Dressing one or multiple wounds '[]'$  - 0 Application of Medications - topical '[]'$  - 0 Application of Medications - injection INTERVENTIONS - Miscellaneous '[]'$  - 0 External ear exam '[]'$  - 0 Specimen Collection (cultures, biopsies, blood, body fluids, etc.) '[]'$  - 0 Specimen(s) / Culture(s) sent or taken to Lab for analysis '[]'$  - 0 Patient Transfer (multiple staff / Civil Service fast streamer / Similar devices) '[]'$  - 0 Simple Staple / Suture removal (25 or less) '[]'$  - 0 Complex Staple / Suture removal (26 or more) '[]'$  - 0 Hypo / Hyperglycemic Management (close monitor of Blood Glucose) [name], [name] ([id number]) [id number] Page 3 of 8 '[]'$  - 0 Ankle / Brachial Index (ABI) - do not check if billed separately X- 1 5 Vital Signs Has the patient been seen at the hospital within the last three years: Yes Total Score: 85 Level Of Care: New/Established - Level 3 Electronic Signature(s) Signed: 04/16/2022 5:35:40 PM By: [name] RN Entered By: [name] on 04/03/2022 10:31:49 -------------------------------------------------------------------------------- Encounter Discharge Information Details Patient Name: Date of Service: Christian Brown Murphy. 04/03/2022 9:15 A M Medical Record  Number: [id number] Patient Account Number: [id number] Date of Birth/Sex: Treating RN: 12/16/1965 (57 y.o. [name], [name] Primary Care Rhayne Chatwin: [name] Other Clinician: Referring Luba Matzen: Treating Darcella Shiffman/Extender: [name] Weeks in Treatment: 36 Encounter Discharge Information Items Discharge Condition: Stable Ambulatory Status: Ambulatory Discharge Destination: Home Transportation: Private Auto Accompanied By: self Schedule Follow-up Appointment: Yes Clinical Summary of Care: Patient Declined Electronic Signature(s) Signed: 04/16/2022 5:35:40 PM By: [name] RN Entered By: [name] on 04/03/2022 10:35:44 -------------------------------------------------------------------------------- Lower Extremity Assessment Details Patient Name: Date of Service: Christian Brown Murphy. 04/03/2022 9:15 A M Medical Record Number: [id number] Patient Account Number: [id number] Date of Birth/Sex: Treating RN: 1965/11/19 (57 y.o. [name] Primary Care Aravind Chrismer: [name] Other Clinician: Referring Breton Berns: Treating Lashundra Shiveley/Extender: [name] Weeks in Treatment: 72 Electronic Signature(s) Signed: 04/03/2022 6:15:21 PM By: Deon Pilling RN, BSN Entered By: Deon Pilling on 04/03/2022 09:17:55 -------------------------------------------------------------------------------- Multi Wound Chart Details Patient Name: Date of Service: Christian Brown Murphy. 04/03/2022 9:15 A M Medical Record Number: [id number] Patient Account Number: [id number] [name], [name] ([id number]) [id number] Page 4 of 8 Date of Birth/Sex: Treating RN: [birth year] (57 y.o. M) Primary Care Blia Totman: Other Clinician: [name] Referring Channah Godeaux: Treating Kris Burd/Extender: [name] Weeks in Treatment: 29 Vital Signs Height(in): Pulse(bpm): 99 Weight(lbs): Blood Pressure(mmHg): 132/77 Body Mass Index(BMI): Temperature(F):  98.1 Respiratory Rate(breaths/min): 20 [1:Photos:] [N/A:N/A] Midline Back N/A N/A Wound Location: Surgical Injury N/A N/A Wounding Event: Open Surgical Wound N/A N/A Primary Etiology: Hypertension, Type II Diabetes, N/A N/A Comorbid History: Osteomyelitis 01/11/2021 N/A N/A Date Acquired: 54 N/A N/A Weeks of Treatment: Healed - Epithelialized N/A N/A Wound Status: No N/A N/A Wound Recurrence: 0x0x0 N/A N/A Measurements L x W x D (cm) 0 N/A N/A A (cm) : rea 0 N/A N/A Volume (cm) : 100.00% N/A  N/A % Reduction in Area: 100.00% N/A N/A % Reduction in Volume: Full Thickness With Exposed Support N/A N/A Classification: Structures None Present N/A N/A Exudate Amount: Distinct, outline attached N/A N/A Wound Margin: None Present (0%) N/A N/A Granulation Amount: None Present (0%) N/A N/A Necrotic Amount: Fascia: No N/A N/A Exposed Structures: Fat Layer (Subcutaneous Tissue): No Tendon: No Muscle: No Joint: No Bone: No Large (67-100%) N/A N/A Epithelialization: Excoriation: No N/A N/A Periwound Skin Texture: Induration: No Callus: No Crepitus: No Rash: No Scarring: No Maceration: No N/A N/A Periwound Skin Moisture: Dry/Scaly: No Atrophie Blanche: No N/A N/A Periwound Skin Color: Cyanosis: No Ecchymosis: No Erythema: No Hemosiderin Staining: No Mottled: No Pallor: No Rubor: No No Abnormality N/A N/A Temperature: Treatment Notes Electronic Signature(s) Signed: 04/03/2022 11:15:03 AM By: Kalman Shan DO Entered By: Kalman Shan on 04/03/2022 10:08:22 Rolly Salter ([id number]) [id number] Page 5 of 8 -------------------------------------------------------------------------------- Multi-Disciplinary Care Plan Details Patient Name: Date of Service: [name], [name] 04/03/2022 9:15 A M Medical Record Number: [id number] Patient Account Number: [id number] Date of Birth/Sex: Treating RN: October 06, 1965 (57 y.o. [name] Primary Care Jonthan Leite: [name] Other Clinician: Referring Ngina Royer: Treating Jarelle Ates/Extender: [name] in Treatment: [place] reviewed with physician Active Inactive Electronic Signature(s) Signed: 04/16/2022 5:35:40 PM By: [name] RN Entered By: [name] on 04/03/2022 09:53:32 -------------------------------------------------------------------------------- Pain Assessment Details Patient Name: Date of Service: [name], [name] 04/03/2022 9:15 A M Medical Record Number: [id number] Patient Account Number: [id number] Date of Birth/Sex: Treating RN: 05/26/1965 (57 y.o. [name] Primary Care Kaytlynn Kochan: [name] Other Clinician: Referring Marcedes Tech: Treating Gerard Bonus/Extender: [name] Weeks in Treatment: 89 Active Problems Location of Pain Severity and Description of Pain Patient Has Paino No Site Locations Rate the pain. Current Pain Level: 0 Pain Management and Medication Current Pain Management: Medication: No Cold Application: No Rest: No Massage: No Activity: No T.MurphyN.S.: No Heat Application: No Leg drop or elevation: No Is the Current Pain Management Adequate: Adequate How does your wound impact your activities of daily livingo Sleep: No Bathing: No TRUSTEN, HUME ([id number]) [id number] Page 6 of 8 Appetite: No Relationship With Others: No Bladder Continence: No Emotions: No Bowel Continence: No Work: No Toileting: No Drive: No Dressing: No Hobbies: No Engineer, maintenance) Signed: 04/03/2022 6:15:21 PM By: Deon Pilling RN, BSN Entered By: Deon Pilling on 04/03/2022 09:18:05 -------------------------------------------------------------------------------- Patient/Caregiver Education Details Patient Name: Date of Service: [name] 12/21/2023andnbsp9:15 A M Medical Record Number: [id number] Patient Account Number:  [id number] Date of Birth/Gender: Treating RN: [birth year] (57 y.o. [name] Primary Care Physician: [name] Other Clinician: Referring Physician: Treating Physician/Extender: [name] in Treatment: 53 Education Assessment Education Provided To: Patient Education Topics Provided Wound/Skin Impairment: Methods: Explain/Verbal Responses: Reinforcements needed, State content correctly Electronic Signature(s) Signed: 04/16/2022 5:35:40 PM By: [name] RN Entered By: [name] on 04/03/2022 09:53:45 -------------------------------------------------------------------------------- Wound Assessment Details Patient Name: Date of Service: [name]. 04/03/2022 9:15 A M Medical Record Number: [id number] Patient Account Number: [id number] Date of Birth/Sex: Treating RN: [birth year] (57 y.o. [name] Primary Care Sorin Frimpong: [name] Other Clinician: Referring Nygeria Lager: Treating Brittony Billick/Extender: [name] Weeks in Treatment: 53 Wound Status Wound Number: 1 Primary Etiology: Open Surgical Wound Wound Location: Midline Back Wound Status: Healed - Epithelialized Wounding Event: Surgical Injury Comorbid History: Hypertension, Type II Diabetes, Osteomyelitis Date Acquired: 01/11/2021 Weeks Of Treatment: 53 Clustered Wound: No  Photos [name], [name] ([id number]) [id number] Page 7 of 8 Wound Measurements Length: (cm) Width: (cm) Depth: (cm) Area: (cm) Volume: (cm) 0 % Reduction in Area: 100% 0 % Reduction in Volume: 100% 0 Epithelialization: Large (67-100%) 0 Tunneling: No 0 Undermining: No Wound Description Classification: Full Thickness With Exposed Suppor Wound Margin: Distinct, outline attached Exudate Amount: None Present t Structures Foul Odor After Cleansing: No Slough/Fibrino No Wound Bed Granulation Amount: None Present (0%) Exposed Structure Necrotic Amount:  None Present (0%) Fascia Exposed: No Fat Layer (Subcutaneous Tissue) Exposed: No Tendon Exposed: No Muscle Exposed: No Joint Exposed: No Bone Exposed: No Periwound Skin Texture Texture Color No Abnormalities Noted: No No Abnormalities Noted: No Callus: No Atrophie Blanche: No Crepitus: No Cyanosis: No Excoriation: No Ecchymosis: No Induration: No Erythema: No Rash: No Hemosiderin Staining: No Scarring: No Mottled: No Pallor: No Moisture Rubor: No No Abnormalities Noted: No Dry / Scaly: No Temperature / Pain Maceration: No Temperature: No Abnormality Treatment Notes Wound #1 (Back) Wound Laterality: Midline Cleanser Peri-Wound Care Topical Primary Dressing Secondary Dressing Secured With Compression Wrap Compression Stockings Add-Ons Electronic Signature(s) Signed: 04/16/2022 5:35:40 PM By: [name] RN Entered By: [name] on 04/03/2022 09:53:13 Viswanathan, Ofilia Neas ([id number]) [id number] Page 8 of 8 -------------------------------------------------------------------------------- Vitals Details Patient Name: Date of Service: [name], [name] 04/03/2022 9:15 A M Medical Record Number: [id number] Patient Account Number: [id number] Date of Birth/Sex: Treating RN: 02/17/1966 (57 y.o. [name] Primary Care Dot Splinter: [name] Other Clinician: Referring Betsi Crespi: Treating Miroslav Gin/Extender: [name] Weeks in Treatment: 62 Vital Signs Time Taken: 09:18 Temperature (F): 98.1 Pulse (bpm): 99 Respiratory Rate (breaths/min): 20 Blood Pressure (mmHg): 132/77 Reference Range: 80 - 120 mg / dl Electronic Signature(s) Signed: 04/03/2022 6:15:21 PM By: Deon Pilling RN, BSN Entered By: Deon Pilling on 04/03/2022 09:19:04

## 2022-04-17 NOTE — Progress Notes (Signed)
UTAH, DELAUDER ([id number]) [id number] Page 1 of 8 Visit Report for 04/03/2022 Chief Complaint Document Details Patient Name: Date of Service: [name], [name] 04/03/2022 9:15 A M Medical Record Number: [id number] Patient Account Number: [id number] Date of Birth/Sex: Treating RN: May 25, 1965 (57 y.o. M) Primary Care Provider: [name] Other Clinician: Referring Provider: Treating Provider/Extender: [name] Weeks in Treatment: 75 Information Obtained from: Patient Chief Complaint 03/28/2021; Back wound Electronic Signature(s) Signed: 04/03/2022 11:15:03 AM By: Kalman Shan DO Entered By: Kalman Shan on 04/03/2022 10:08:30 -------------------------------------------------------------------------------- HPI Details Patient Name: Date of Service: Christian Brown E. 04/03/2022 9:15 A M Medical Record Number: [id number] Patient Account Number: [id number] Date of Birth/Sex: Treating RN: Jun 20, 1965 (57 y.o. M) Primary Care Provider: [name] Other Clinician: Referring Provider: Treating Provider/Extender: [name] Weeks in Treatment: 79 History of Present Illness HPI Description: Admission 03/28/2021 Mr. Icker Swigert is a 57 year old male with a past medical history of controlled type 2 diabetes on oral agents, obesity and OSA that presents to the clinic for a back wound. On 01/11/2021 patient had a laminectomy with PLIF of L1-S1 by Dr. Venetia Constable because of lumbar stenosis and radiculopathy. He subsequently developed bacteremia. He had CT imaging on 10/13 of the lumbar spine that showed fluid collection in the soft tissue of the posterior L1 and S1 and was taken to the OR for washout on 10/14. 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 He received 4 weeks of IV antibiotics by infectious disease. After his completion of 4 weeks of IV antibiotics he was continued for an additional 4 weeks of IV  cefazolin with a stop date of 12/29. He has been evaluated by plastic surgery and no plans for surgical intervention at this time. Wife is present and reports he has been on the wound VAC for the past 8 weeks with improvement in wound healing. He currently denies systemic signs of infection. 12/22; patient presents for follow-up. He reports no issues since last clinic visit. He denies signs of infection. He has been tolerating the wound VAC well. 12/30; patient presents for follow-up. He reports no issues and has no complaints today. He has been tolerating the wound VAC well. 1/9; patient presents for follow-up. He has no issues or complaints today. He states he feels well. He has had no problems with the wound VAC. 1/16; patient presents for follow-up. He continues to use the wound VAC with no issues. He denies signs of infection. 1/23; patient presents for follow-up. He has been switched from IV cefazolin to oral cefadroxil by infectious disease. He has no issues or complaints today. He denies signs of infection. He continues to tolerate the wound VAC well. 1/30; patient presents for follow-up. He continues to tolerate the wound VAC well. 2/6; patient presents for follow-up. He has no issues or complaints today. He continues to tolerate the wound VAC well. He denies signs of infection. 2/13; patient presents for follow-up. He continues to do well with the wound VAC. He denies any issues. 2/27; patient presents for follow-up. He continues to use the wound VAC without any issues. He denies signs of infection. 3/20; patient presents for follow-up. He has no issues or complaints today. He continues to use the wound VAC. [name] ([id number]) [id number] Page 2 of 8 4/3; patient presents for follow-up. He continues to use the wound VAC without issues. He denies signs of infection. 4/17; 2-week follow-up. He continues to do well. His measurements  are improved. Initially a  surgical wound complicated by infection. 5/1; patient presents for follow-up. He has no issues or complaints today. He continues to tolerate the wound VAC well. He denies signs of infection. 5/15; patient presents for follow-up. He has noted more maceration to the periwound. He has been using the wound VAC without issues. He currently denies signs of infection. 5/30; patient presents for follow-up. He has been tolerating the wound VAC well over the past 2 weeks. He no longer has maceration to the periwound. He has no issues or complaints today. 6/8; this patient with a postsurgical wound that was complicated by postop infection. He has been using silver collagen under wound VAC and gradually doing well improvement in dimensions especially the tunnel at 12:00 6/22; patient presents for follow-up. We have been using silver collagen under the wound VAC. Patient has no issues or questions today. 7/6; patient presents for follow-up. Patient continues to use collagen under the wound VAC with no issues. 7/20; patient presents for follow-up. He has been using collagen under the wound VAC with no issues. He has been approved for Epicord. This was discussed with the patient and he is in agreement to having this placed at next clinic visit. 7/27; patient presents for follow-up. He has been tolerating the wound VAC well with collagen underneath. He has been approved for epi cord and we have this to place in office today. 8/4; patient presents for follow-up. He had Epicort placed at last clinic visit. We held off on the wound VAC. He tolerated this well. He has no issues or complaints today. 8/11; patient presents for follow-up. Epicord was placed at last clinic visit. He had the wound VAC on for the past week. He states that no drainage was suctioned into the canister. He has slight maceration to the periwound. 8/18; patient presents for follow-up. Epicord #3 was placed in standard fashion last week. The wound  VAC was held. He has no issues or complaints today. 8/25; Patient presents for follow-up. Epicord #4 was placed in standard fashion last week. The wound VAC was held again. He has no issues or complaints today. There has been improvement in wound healing. 9/1; patient presents for follow-up. Epicort #5 was placed in standard fashion last week. He has no issues or complaints today. 9/8; patient presents for follow-up. Epicord #6 was placed in standard fashion last week. He tolerated this well and has no issues or complaints today. 9/15; Epicort No. 7 the wound looks healthy. Perhaps some hyper granulated tissue in the lower 50% of the longitudinal wound 9/22; Epicord #8 placed at last clinic visit. Wound appears well-healing. Patient has no issues or complaints today. 9/28. Epicord No. 9. Continued improvement. 10/6; patient has completed the allotted epicord Applications that insurance would cover. Insurance denied extending more applications. Patient has no issues or complaints today. He tolerated the last application well. 10/13; patient presents for follow-up. We have been using endoform to the wound bed over the past week. He has been approved for 5 more applications of Epicord. 10/20; patient presents for follow-up. Epicort was placed at last clinic visit. He has no issues or complaints today. He denies signs of infection. 10/27; patient presents for follow-up. Epicort was placed in standard fashion at last clinic visit. He has no issues or complaints today. He denies signs of infection. 11/3; Epicort No. 13. Tunneling depth at 12:00 is 2.5 cm as measured by myself 11/10; last patient's approved Epicort #15 was placed in standard fashion  today. Janalyn Shy is stable however has shown improvement in healing over the past several weeks. He has no issues or complaints today. 11/17; patient presents for follow-up. He has completed his course of approved epi cord dressings. He has improved greatly in  his wound healing. The tunnel is almost completely filled in. He has no issues or complaints today. 11/21; patient presents for follow-up. He has been using endoform to the wound bed. He has no issues or complaints today. 12/1; patient presents for follow-up. He has been using endoform to the wound bed. The wound is almost healed. 12/8; patient presents for follow-up. He has been using endoform to the wound bed. There still remains a small open wound. He denies signs of infection. 12/21; patient presents for follow-up. He has been using endoform to the wound bed. He reports no drainage. He has had no issues to report on. Electronic Signature(s) Signed: 04/03/2022 11:15:03 AM By: Kalman Shan DO Entered By: Kalman Shan on 04/03/2022 10:09:04 -------------------------------------------------------------------------------- Physical Exam Details Patient Name: Date of Service: GOVONI, Malvin Johns E. 04/03/2022 9:15 A KYRILLOS, ADAMS ([id number]) [id number] Page 3 of 8 Medical Record Number: [id number] Patient Account Number: [id number] Date of Birth/Sex: Treating RN: 1965/08/27 (57 y.o. M) Primary Care Provider: [name] Other Clinician: Referring Provider: Treating Provider/Extender: [name] Weeks in Treatment: 15 Constitutional respirations regular, non-labored and within target range for patient.Marland Kitchen Psychiatric pleasant and cooperative. Notes T the lumbar spine surgical incision site there is epithelization to the previous wound site. Attempted to probe but no tunneling noted. No signs of infection o including increased warmth, erythema or purulent drainage. Electronic Signature(s) Signed: 04/03/2022 11:15:03 AM By: Kalman Shan DO Entered By: Kalman Shan on 04/03/2022 10:09:53 -------------------------------------------------------------------------------- Physician Orders Details Patient Name: Date of Service: Christian Brown E.  04/03/2022 9:15 A M Medical Record Number: [id number] Patient Account Number: [id number] Date of Birth/Sex: Treating RN: 1965/12/10 (57 y.o. 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 04/03/2022 9:15 A M Medical Record Number: [id number] Patient Account Number: [id number] Date of Birth/Sex: Treating RN: [birth year] (57 y.o. 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 04/03/2022 9:15 A M Medical Record Number: [id number] Patient Account Number: [id number] Date of  Birth/Sex: Treating RN: 06-19-1965 (57 y.o. M) Primary Care Provider: [name] Other Clinician: Referring Provider: Treating Provider/Extender: [name] Weeks in Treatment: 66 Subjective Chief Complaint Information obtained from Patient 03/28/2021; Back wound History of Present Illness (HPI) Admission  03/28/2021 Mr. Sahan Pen is a 57 year old male with a past medical history of controlled type 2 diabetes on oral agents, obesity and OSA that presents to the clinic for a back wound. On 01/11/2021 patient had a laminectomy with PLIF of L1-S1 by Dr. Venetia Constable because of lumbar stenosis and radiculopathy. He subsequently developed bacteremia. He had CT imaging on 10/13 of the lumbar spine that showed fluid collection in the soft tissue of the posterior L1 and S1 and was taken to the OR for washout on 10/14. 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 He received 4 weeks of IV antibiotics by infectious disease. After his completion of 4 weeks of IV antibiotics he was continued for an additional 4 weeks of IV cefazolin with a stop date of 12/29. He has been evaluated by plastic surgery and no plans for surgical intervention at this time. Wife is present and reports he has been on the wound VAC for the past 8 weeks with improvement in wound healing. He currently denies systemic signs of infection. 12/22; patient presents for follow-up. He reports no issues since last clinic visit. He denies signs of infection. He has been tolerating the wound VAC well. 12/30; patient presents for follow-up. He reports no issues and has no complaints today. He has been tolerating the wound VAC well. 1/9; patient presents for follow-up. He has no issues or complaints today. He states he feels well. He has had no problems with the wound VAC. 1/16; patient presents for follow-up. He continues to use the wound VAC with no issues. He denies signs of infection. 1/23; patient  presents for follow-up. He has been switched from IV cefazolin to oral cefadroxil by infectious disease. He has no issues or complaints today. He denies signs of infection. He continues to tolerate the wound VAC well. 1/30; patient presents for follow-up. He continues to tolerate the wound VAC well. 2/6; patient presents for follow-up. He has no issues or complaints today. He continues to tolerate the wound VAC well. He denies signs of infection. 2/13; patient presents for follow-up. He continues to do well with the wound VAC. He denies any issues. 2/27; patient presents for follow-up. He continues to use the wound VAC without any issues. He denies signs of infection. 3/20; patient presents for follow-up. He has no issues or complaints today. He continues to use the wound VAC. 4/3; patient presents for follow-up. He continues to use the wound VAC without issues. He denies signs of infection. 4/17; 2-week follow-up. He continues to do well. His measurements are improved. Initially a surgical wound complicated by infection. JOWELL, BOSSI ([id number]) [id number] Page 5 of 8 5/1; patient presents for follow-up. He has no issues or complaints today. He continues to tolerate the wound VAC well. He denies signs of infection. 5/15; patient presents for follow-up. He has noted more maceration to the periwound. He has been using the wound VAC without issues. He currently denies signs of infection. 5/30; patient presents for follow-up. He has been tolerating the wound VAC well over the past 2 weeks. He no longer has maceration to the periwound. He has no issues or complaints today. 6/8; this patient with a postsurgical wound that was complicated by postop infection. He has been using silver collagen under wound VAC and gradually doing well improvement in dimensions especially the tunnel at 12:00 6/22; patient presents for follow-up. We have been using silver collagen under the wound  VAC. Patient has no issues or questions  today. 7/6; patient presents for follow-up. Patient continues to use collagen under the wound VAC with no issues. 7/20; patient presents for follow-up. He has been using collagen under the wound VAC with no issues. He has been approved for Epicord. This was discussed with the patient and he is in agreement to having this placed at next clinic visit. 7/27; patient presents for follow-up. He has been tolerating the wound VAC well with collagen underneath. He has been approved for epi cord and we have this to place in office today. 8/4; patient presents for follow-up. He had Epicort placed at last clinic visit. We held off on the wound VAC. He tolerated this well. He has no issues or complaints today. 8/11; patient presents for follow-up. Epicord was placed at last clinic visit. He had the wound VAC on for the past week. He states that no drainage was suctioned into the canister. He has slight maceration to the periwound. 8/18; patient presents for follow-up. Epicord #3 was placed in standard fashion last week. The wound VAC was held. He has no issues or complaints today. 8/25; Patient presents for follow-up. Epicord #4 was placed in standard fashion last week. The wound VAC was held again. He has no issues or complaints today. There has been improvement in wound healing. 9/1; patient presents for follow-up. Epicort #5 was placed in standard fashion last week. He has no issues or complaints today. 9/8; patient presents for follow-up. Epicord #6 was placed in standard fashion last week. He tolerated this well and has no issues or complaints today. 9/15; Epicort No. 7 the wound looks healthy. Perhaps some hyper granulated tissue in the lower 50% of the longitudinal wound 9/22; Epicord #8 placed at last clinic visit. Wound appears well-healing. Patient has no issues or complaints today. 9/28. Epicord No. 9. Continued improvement. 10/6; patient has completed the  allotted epicord Applications that insurance would cover. Insurance denied extending more applications. Patient has no issues or complaints today. He tolerated the last application well. 10/13; patient presents for follow-up. We have been using endoform to the wound bed over the past week. He has been approved for 5 more applications of Epicord. 10/20; patient presents for follow-up. Epicort was placed at last clinic visit. He has no issues or complaints today. He denies signs of infection. 10/27; patient presents for follow-up. Epicort was placed in standard fashion at last clinic visit. He has no issues or complaints today. He denies signs of infection. 11/3; Epicort No. 13. Tunneling depth at 12:00 is 2.5 cm as measured by myself 11/10; last patient's approved Epicort #15 was placed in standard fashion today. Tunneling is stable however has shown improvement in healing over the past several weeks. He has no issues or complaints today. 11/17; patient presents for follow-up. He has completed his course of approved epi cord dressings. He has improved greatly in his wound healing. The tunnel is almost completely filled in. He has no issues or complaints today. 11/21; patient presents for follow-up. He has been using endoform to the wound bed. He has no issues or complaints today. 12/1; patient presents for follow-up. He has been using endoform to the wound bed. The wound is almost healed. 12/8; patient presents for follow-up. He has been using endoform to the wound bed. There still remains a small open wound. He denies signs of infection. 12/21; patient presents for follow-up. He has been using endoform to the wound bed. He reports no drainage. He has had no issues to report on.  Patient History Information obtained from Patient. Family History Diabetes - Mother, Stroke - Siblings, No family history of Cancer, Heart Disease, Hereditary Spherocytosis, Hypertension, Kidney Disease, Lung Disease,  Seizures, Thyroid Problems, Tuberculosis. Social History Never smoker, Marital Status - Married, Alcohol Use - Rarely, Drug Use - No History, Caffeine Use - Rarely. Medical History Cardiovascular Patient has history of Hypertension Endocrine Patient has history of Type II Diabetes Musculoskeletal Patient has history of Osteomyelitis Medical A Surgical History Notes nd Musculoskeletal DDD AMERY, MINASYAN ([id number]) [id number] Page 6 of 8 Objective Constitutional respirations regular, non-labored and within target range for patient.. Vitals Time Taken: 9:18 AM, Temperature: 98.1 F, Pulse: 99 bpm, Respiratory Rate: 20 breaths/min, Blood Pressure: 132/77 mmHg. Psychiatric pleasant and cooperative. General Notes: T the lumbar spine surgical incision site there is epithelization to the previous wound site. Attempted to probe but no tunneling noted. No signs o of infection including increased warmth, erythema or purulent drainage. Integumentary (Hair, Skin) Wound #1 status is Healed - Epithelialized. Original cause of wound was Surgical Injury. The date acquired was: 01/11/2021. The wound has been in treatment 53 weeks. The wound is located on the Midline Back. The wound measures 0cm length x 0cm width x 0cm depth; 0cm^2 area and 0cm^3 volume. There is no tunneling or undermining noted. There is a none present amount of drainage noted. The wound margin is distinct with the outline attached to the wound base. There is no granulation within the wound bed. There is no necrotic tissue within the wound bed. The periwound skin appearance did not exhibit: Callus, Crepitus, Excoriation, Induration, Rash, Scarring, Dry/Scaly, Maceration, Atrophie Blanche, Cyanosis, Ecchymosis, Hemosiderin Staining, Mottled, Pallor, Rubor, Erythema. Periwound temperature was noted as No Abnormality. Assessment Active Problems ICD-10 Non-pressure chronic ulcer of back with necrosis of  bone Osteomyelitis, unspecified Type 2 diabetes mellitus with other skin ulcer Patient has done well with endoform. His wound has healed. He knows to call with any questions or concerns. He can keep the area protected with a foam border dressing every other day to assure there is no drainage. He can do this for about 1 week. Follow-up as needed. Plan 1. Discharge from clinic due to closed wound 2. Follow-up as needed Electronic Signature(s) Signed: 04/03/2022 11:15:03 AM By: Kalman Shan DO Entered By: Kalman Shan on 04/03/2022 10:24:22 -------------------------------------------------------------------------------- HxROS Details Patient Name: Date of Service: Christian Brown E. 04/03/2022 9:15 A M Medical Record Number: [id number] Patient Account Number: [id number] Date of Birth/Sex: Treating RN: [birth year] (57 y.o. M) Primary Care Provider: [name] Other Clinician: Referring Provider: Treating Provider/Extender: [name] in Treatment: [address], [place] ([id number]) [id number] Page 7 of 8 Information Obtained From Patient Cardiovascular Medical History: Positive for: Hypertension Endocrine Medical History: Positive for: Type II Diabetes Time with diabetes: Since mid 90's Treated with: Oral agents Blood sugar tested every day: Yes Tested : 2x day Musculoskeletal Medical History: Positive for: Osteomyelitis Past Medical History Notes: DDD Immunizations Pneumococcal Vaccine: Received Pneumococcal Vaccination: Yes Received Pneumococcal Vaccination On or After 60th Birthday: No Implantable Devices Yes Family and Social History Cancer: No; Diabetes: Yes - Mother; Heart Disease: No; Hereditary Spherocytosis: No; Hypertension: No; Kidney Disease: No; Lung Disease: No; Seizures: No; Stroke: Yes - Siblings; Thyroid Problems: No; Tuberculosis: No; Never smoker; Marital Status - Married; Alcohol Use: Rarely; Drug Use: No  History; Caffeine Use: Rarely; Financial Concerns: No; Food, Clothing or Shelter Needs: No; Support System Lacking: No; Transportation Concerns: No Electronic Signature(s) Signed: 04/03/2022  11:15:03 AM By: Kalman Shan DO Entered By: Kalman Shan on 04/03/2022 10:09:09 -------------------------------------------------------------------------------- SuperBill Details Patient Name: Date of Service: [name] 04/03/2022 Medical Record Number: [id number] Patient Account Number: [id number] Date of Birth/Sex: Treating RN: [birth year] (57 y.o. 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 M86.9 Osteomyelitis, unspecified E11.622 Type 2 diabetes mellitus with other skin ulcer Facility Procedures : CPT4 Code: [id number] Description: 99213 - WOUND CARE VISIT-LEV 3 EST PT Modifier: Quantity: 1 Physician Procedures : CPT4 Code Description Modifier CEFERINO, LANG ([id number]) [id number] 99213 - WC PHYS LEVEL 3 - EST PT 1 ICD-10 Diagnosis Description 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

## 2022-04-29 ENCOUNTER — Other Ambulatory Visit (HOSPITAL_COMMUNITY): Payer: Self-pay

## 2022-04-29 ENCOUNTER — Other Ambulatory Visit: Payer: Self-pay | Admitting: Internal Medicine

## 2022-04-29 MED ORDER — LOSARTAN POTASSIUM-HCTZ 100-12.5 MG PO TABS
1.0000 | ORAL_TABLET | Freq: Every day | ORAL | 1 refills | Status: DC
  Filled 2022-04-29: qty 90, 90d supply, fill #0
  Filled 2022-08-31: qty 90, 90d supply, fill #1

## 2022-04-30 ENCOUNTER — Other Ambulatory Visit: Payer: Self-pay

## 2022-05-16 DIAGNOSIS — M5432 Sciatica, left side: Secondary | ICD-10-CM | POA: Diagnosis not present

## 2022-05-16 DIAGNOSIS — Z6841 Body Mass Index (BMI) 40.0 and over, adult: Secondary | ICD-10-CM | POA: Diagnosis not present

## 2022-05-16 DIAGNOSIS — M4326 Fusion of spine, lumbar region: Secondary | ICD-10-CM | POA: Diagnosis not present

## 2022-05-20 ENCOUNTER — Other Ambulatory Visit: Payer: Self-pay | Admitting: Physician Assistant

## 2022-05-20 DIAGNOSIS — M4326 Fusion of spine, lumbar region: Secondary | ICD-10-CM

## 2022-05-22 ENCOUNTER — Encounter: Payer: Self-pay | Admitting: Internal Medicine

## 2022-05-25 ENCOUNTER — Other Ambulatory Visit: Payer: Self-pay | Admitting: Cardiology

## 2022-05-25 DIAGNOSIS — I2510 Atherosclerotic heart disease of native coronary artery without angina pectoris: Secondary | ICD-10-CM

## 2022-05-26 ENCOUNTER — Other Ambulatory Visit: Payer: Self-pay

## 2022-05-26 ENCOUNTER — Other Ambulatory Visit (HOSPITAL_COMMUNITY): Payer: Self-pay

## 2022-05-26 MED ORDER — ROSUVASTATIN CALCIUM 20 MG PO TABS
20.0000 mg | ORAL_TABLET | Freq: Every day | ORAL | 1 refills | Status: DC
  Filled 2022-05-26: qty 15, 15d supply, fill #0
  Filled 2022-06-09: qty 15, 15d supply, fill #1

## 2022-05-29 ENCOUNTER — Other Ambulatory Visit: Payer: Self-pay | Admitting: Registered Nurse

## 2022-06-02 ENCOUNTER — Ambulatory Visit (INDEPENDENT_AMBULATORY_CARE_PROVIDER_SITE_OTHER): Payer: 59 | Admitting: Internal Medicine

## 2022-06-02 ENCOUNTER — Encounter: Payer: Self-pay | Admitting: Internal Medicine

## 2022-06-02 VITALS — BP 126/74 | HR 103 | Temp 97.9°F | Resp 18 | Ht 74.0 in | Wt 316.0 lb

## 2022-06-02 DIAGNOSIS — Z23 Encounter for immunization: Secondary | ICD-10-CM | POA: Diagnosis not present

## 2022-06-02 DIAGNOSIS — I10 Essential (primary) hypertension: Secondary | ICD-10-CM

## 2022-06-02 DIAGNOSIS — E7800 Pure hypercholesterolemia, unspecified: Secondary | ICD-10-CM | POA: Diagnosis not present

## 2022-06-02 DIAGNOSIS — E1140 Type 2 diabetes mellitus with diabetic neuropathy, unspecified: Secondary | ICD-10-CM | POA: Diagnosis not present

## 2022-06-02 LAB — BASIC METABOLIC PANEL
BUN: 20 mg/dL (ref 6–23)
CO2: 26 mEq/L (ref 19–32)
Calcium: 9.3 mg/dL (ref 8.4–10.5)
Chloride: 99 mEq/L (ref 96–112)
Creatinine, Ser: 1.41 mg/dL (ref 0.40–1.50)
GFR: 54.78 mL/min — ABNORMAL LOW (ref >60.00)
Glucose, Bld: 254 mg/dL — ABNORMAL HIGH (ref 70–99)
Potassium: 4 mEq/L (ref 3.5–5.1)
Sodium: 135 mEq/L (ref 135–145)

## 2022-06-02 LAB — HEMOGLOBIN A1C: Hgb A1c MFr Bld: 9.2 % — ABNORMAL HIGH (ref 4.6–6.5)

## 2022-06-02 NOTE — Progress Notes (Signed)
Subjective:    Patient ID: [name], male    DOB: [birth year], 57 y.o.   MRN: [id number]  DOS:  06/02/2022 Type of visit - description: f/u Since the last office visit things are about the same. Continue with back pain. Ambulatory BPs when checked normal. Weight gain noted.  States he is eating more. Denies fever or chills  Wt Readings from Last 3 Encounters:  06/02/22 (!) 316 lb (143.3 kg)  03/27/22 (!) 304 lb (137.9 kg)  02/06/22 298 lb (135.2 kg)     Review of Systems See above   Past Medical History:  Diagnosis Date   DDD (degenerative disc disease), lumbar    Diabetes mellitus    Hyperlipemia    Hypertension    Obesity    OSA (obstructive sleep apnea)    on CPAP    Past Surgical History:  Procedure Laterality Date   APPLICATION OF ROBOTIC ASSISTANCE FOR SPINAL PROCEDURE N/A 01/11/2021   Procedure: APPLICATION OF ROBOTIC ASSISTANCE FOR SPINAL PROCEDURE;  Surgeon: Jadene Pierini, MD;  Location: [facility];  Service: Neurosurgery;  Laterality: N/A;   APPLICATION OF WOUND VAC N/A 01/25/2021   Procedure: APPLICATION OF WOUND VAC;  Surgeon: Dawley, Alan Mulder, DO;  Location: [facility];  Service: Neurosurgery;  Laterality: N/A;   BACK SURGERY     COLONOSCOPY  2019   LUMBAR WOUND DEBRIDEMENT N/A 01/25/2021   Procedure: Irrigation and Debridement of lumbar wound, and wound vacuum assisted closure;  Surgeon: Dawley, Alan Mulder, DO;  Location: [facility];  Service: Neurosurgery;  Laterality: N/A;   TRANSFORAMINAL LUMBAR INTERBODY FUSION (TLIF) WITH PEDICLE SCREW FIXATION 4 LEVEL N/A 01/11/2021   Procedure: Lumbar one-two, Lumbar two-three, Lumbar three-four, Lumbar four-five, Lumbar five Sacral one Open decompression, Transforaminal lumbar interbody fusion, posterolateral instrumented fusion;  Surgeon: Jadene Pierini, MD;  Location: [facility];  Service: Neurosurgery;  Laterality: N/A;    Current Outpatient Medications  Medication Instructions   ascorbic acid (VITAMIN C) 500 mg, Oral, 2  times daily   Blood Glucose Monitoring Suppl (FREESTYLE LITE) w/Device KIT Use to check blood sugar up to 4 times daily as directed   Blood Glucose Monitoring Suppl (ONETOUCH VERIO FLEX SYSTEM) w/Device KIT Test blood sugar twice a day.  Dx code: E11.9   cefadroxil (DURICEF) 500 mg, Oral, 2 times daily   cyclobenzaprine (FLEXERIL) 10 mg, Oral, 3 times daily PRN   diclofenac (VOLTAREN) 75 MG EC tablet Take 1 tablet (75 mg total) by mouth daily as needed for arthritis pain- kidney function OK   fluticasone (FLONASE) 50 MCG/ACT nasal spray Nasal   gabapentin (NEURONTIN) 600 mg, Oral, 3 times daily   glucose blood (FREESTYLE LITE) test strip Use 1 strip to check blood sugar up to 4 times daily as directed   glucose blood (ONETOUCH VERIO) test strip Check blood sugars before meals and at bedtime.  twice daily   Lancets (FREESTYLE) lancets Use 1 lancet to check blood sugar up to 4 times daily as directed   losartan-hydrochlorothiazide (HYZAAR) 100-12.5 MG tablet 1 tablet, Oral, Daily   morphine (MS CONTIN) 15 mg, Oral, Every 12 hours   naloxone (NARCAN) nasal spray 4 mg/0.1 mL Use as directed   ondansetron (ZOFRAN) 4 MG tablet TAKE 1 TABLET BY MOUTH EVERY 8 HOURS AS NEEDED FOR NAUSEA/VOMITING. CAN CAUSE CONSTIPATION- MONITOR   polyethylene glycol (MIRALAX / GLYCOLAX) 17 g, Oral, 2 times daily   promethazine (PHENERGAN) 12.5 mg, Oral, Every 6 hours PRN   rosuvastatin (  CRESTOR) 20 mg, Oral, Daily   senna (SENOKOT) 25.8 mg, Oral, 2 times daily   simethicone (MYLICON) 80 mg, Oral, 4 times daily PRN   tadalafil (CIALIS) 10-20 mg, Oral, Every 48 hours PRN   tamsulosin (FLOMAX) 0.8 mg, Oral, Daily   tiZANidine (ZANAFLEX) 4 mg, Oral, 2 times daily   Zinc Sulfate 220 mg, Oral, Daily       Objective:   Physical Exam BP 126/74   Pulse (!) 103   Temp 97.9 F (36.6 C) (Oral)   Resp 18   Ht 6\' 2"  (1.88 m)   Wt (!) 316 lb (143.3 kg)   SpO2 96%   BMI 40.57 kg/m  General:   Well developed, NAD, BMI  noted. HEENT:  Normocephalic . Face symmetric, atraumatic Lungs:  CTA B Normal respiratory effort, no intercostal retractions, no accessory muscle use. Heart: Slightly tachycardic but regular.  Lower extremities: no pretibial edema bilaterally  Skin: Not pale. Not jaundice Neurologic:  alert & oriented X3.  Speech normal, gait appropriate for age and unassisted Psych--  Cognition and judgment appear intact.  Cooperative with normal attention span and concentration.  Behavior appropriate. No anxious or depressed appearing.      Assessment     Assessment DM NO neuropathy per [facility] 07-2020.  SX related to back DJD HTN Hyperlipidemia MSK: -DJD Knees , back (severe per MRI, + radiation B legs) -Chronic neck pain Morbid obesity OSA  (severe per repeated study April 2022), off  CPAP since ~ 02-2021 Diastases recti Back surgery, discitis, lumbar osteomyelitis  PLAN: Routine checkup DM: Currently diet controlled, for a while was not eating much due to comorbidities but diet is now regular, increased p.o. intake, +wt gain noted.   Check A1c, encouraged healthy diet, further advise w/ results HTN: BP today is very good, reports normal ambulatory BPs, slightly tachycardic. Continue Hyzaar, check BMP. Hyperlipidemia: Controlled on Crestor Discitis, lumbar osteomyelitis.  Still has some pain, on NSAIDs and chronic antibiotics, to see ID soon.  Patient reports a CT of the back is pending. Mild tachycardia: Heart rate 103.  Observation Preventive care: Tdap and second shingles today. RTC 6 months

## 2022-06-02 NOTE — Patient Instructions (Addendum)
Check the  blood pressure regularly BP GOAL is between 110/65 and  135/85. If it is consistently higher or lower, let me know  Watch your diet closely  GO TO THE LAB : Get the blood work     [facility], [place] back for   a checkup in 6 months  Vaccines I recommend;  Covid booster Shingrix (shingles) #2   Per our records you are due for your diabetic eye exam. Please contact your eye doctor to schedule an appointment. Please have them send copies of your office visit notes to [name]. Our fax number is [phone]. If you need a referral to an eye doctor please let us know.

## 2022-06-03 ENCOUNTER — Encounter: Payer: Self-pay | Admitting: Physical Medicine and Rehabilitation

## 2022-06-03 NOTE — Assessment & Plan Note (Signed)
Routine checkup DM: Currently diet controlled, for a while was not eating much due to comorbidities but diet is now regular, increased p.o. intake, +wt gain noted.   Check A1c, encouraged healthy diet, further advise w/ results HTN: BP today is very good, reports normal ambulatory BPs, slightly tachycardic. Continue Hyzaar, check BMP. Hyperlipidemia: Controlled on Crestor Discitis, lumbar osteomyelitis.  Still has some pain, on NSAIDs and chronic antibiotics, to see ID soon.  Patient reports a CT of the back is pending. Mild tachycardia: Heart rate 103.  Observation Preventive care: Tdap and second shingles today. RTC 6 months

## 2022-06-04 ENCOUNTER — Other Ambulatory Visit (HOSPITAL_COMMUNITY): Payer: Self-pay

## 2022-06-04 ENCOUNTER — Telehealth: Payer: Self-pay | Admitting: *Deleted

## 2022-06-04 MED ORDER — METFORMIN HCL 1000 MG PO TABS
ORAL_TABLET | ORAL | 1 refills | Status: DC
  Filled 2022-06-04: qty 180, 90d supply, fill #0
  Filled 2022-08-04 – 2022-08-18 (×2): qty 180, 5d supply, fill #1

## 2022-06-04 MED ORDER — MORPHINE SULFATE ER 15 MG PO TBCR
15.0000 mg | EXTENDED_RELEASE_TABLET | Freq: Two times a day (BID) | ORAL | 0 refills | Status: DC
  Filled 2022-06-04: qty 60, 30d supply, fill #0

## 2022-06-04 MED ORDER — PIOGLITAZONE HCL 30 MG PO TABS
30.0000 mg | ORAL_TABLET | Freq: Every day | ORAL | 1 refills | Status: DC
  Filled 2022-06-04: qty 90, 90d supply, fill #0

## 2022-06-04 NOTE — Telephone Encounter (Signed)
Rx written and sent to the pharmacy. Thanks!

## 2022-06-04 NOTE — Telephone Encounter (Signed)
Can you please refill in the absence of [name] and Dr. Dagoberto Ligas? Last office visit:03/27/22 Last refill: 03/27/22 Quantity:#60   Rx #: [id number] morphine (MS CONTIN) 15 MG 12 hr tablet [id number]  Order Details Dose: 15 mg Route: Oral Frequency: Every 12 hours  Dispense Quantity: 60 tablet Refills: 0 Fills remaining: 0

## 2022-06-04 NOTE — Addendum Note (Signed)
Addended byDamita Dunnings D on: 06/04/2022 03:22 PM   Modules accepted: Orders

## 2022-06-05 ENCOUNTER — Other Ambulatory Visit (HOSPITAL_COMMUNITY): Payer: Self-pay

## 2022-06-09 ENCOUNTER — Encounter: Payer: Self-pay | Admitting: Infectious Diseases

## 2022-06-09 ENCOUNTER — Other Ambulatory Visit: Payer: Self-pay

## 2022-06-09 ENCOUNTER — Ambulatory Visit (INDEPENDENT_AMBULATORY_CARE_PROVIDER_SITE_OTHER): Payer: 59 | Admitting: Infectious Diseases

## 2022-06-09 VITALS — BP 120/81 | HR 103 | Temp 97.8°F | Resp 16 | Ht 74.0 in | Wt 317.8 lb

## 2022-06-09 DIAGNOSIS — Z5181 Encounter for therapeutic drug level monitoring: Secondary | ICD-10-CM | POA: Diagnosis not present

## 2022-06-09 DIAGNOSIS — T847XXD Infection and inflammatory reaction due to other internal orthopedic prosthetic devices, implants and grafts, subsequent encounter: Secondary | ICD-10-CM | POA: Diagnosis not present

## 2022-06-09 NOTE — Progress Notes (Signed)
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   Klebsiella pneumoniae infection 01/28/2021   Wound infection after surgery 01/25/2021   Muscle spasms of both lower extremities 01/25/2021   Abdominal pain 01/25/2021   Spleen enlarged 01/25/2021   Lumbar radiculopathy 01/11/2021   Spasticity 12/03/2020   Neuropathy 09/28/2020   Chronic pain syndrome 09/28/2020   Intervertebral lumbar disc disorder with myelopathy, lumbar region 09/28/2020   Spondylosis, cervical, with myelopathy 09/28/2020   Unilateral primary osteoarthritis, right knee 10/28/2019   PCP NOTES >>>>>>>> 07/09/2015   DJD (degenerative joint disease) 10/27/2014   Screening for heart disease 06/28/2012   OSA (obstructive sleep apnea) 06/14/2012   ED (erectile dysfunction) 04/26/2012   Annual physical exam 12/29/2011   Achilles tendinitis 09/10/2010   Allergic rhinitis 07/12/2010   Hyperlipidemia 04/26/2007   DM II (diabetes mellitus, type II), controlled ([place]) 04/22/2006   OBESITY NOS 04/22/2006   Essential hypertension 04/22/2006   Current Outpatient Medications on File Prior to Visit  Medication Sig Dispense Refill   ascorbic acid (VITAMIN C) 500 MG tablet Take 1 tablet (500 mg total) by mouth 2 (two) times daily. 100 tablet 3   Blood Glucose Monitoring Suppl (FREESTYLE LITE) w/Device KIT Use to check blood sugar up to 4 times daily as directed 1 kit 0   Blood Glucose Monitoring Suppl ([place]) w/Device KIT Test blood sugar twice a day.  Dx  code: E11.9 1 kit 0   cefadroxil (DURICEF) 500 MG capsule Take 1 capsule (500 mg total) by mouth 2 (two) times daily. 60 capsule 11   cyclobenzaprine (FLEXERIL) 10 MG tablet Take 1 tablet (10 mg total) by mouth 3 (three) times daily as needed for muscle spasms. 90 tablet 0   diclofenac (VOLTAREN) 75 MG EC tablet Take 1 tablet (75 mg total) by mouth daily as needed for arthritis pain- kidney function OK 20 tablet 5   fluticasone (FLONASE) 50 MCG/ACT nasal spray Place into the nose.     gabapentin (NEURONTIN) 600 MG tablet Take 1 tablet (600 mg total) by mouth 3 (three) times daily. 270 tablet 1   glucose blood (FREESTYLE LITE) test strip Use 1 strip to check blood sugar up to 4 times daily as directed 200 each 0   glucose blood (ONETOUCH VERIO) test strip Check blood sugars before meals and at bedtime.  twice daily 200 strip 12   Lancets (FREESTYLE) lancets Use 1 lancet to check blood sugar up to 4 times daily as directed 200 each 0   losartan-hydrochlorothiazide (HYZAAR) 100-12.5 MG tablet Take 1 tablet by mouth daily. 90 tablet 1   metFORMIN (GLUCOPHAGE) 1000 MG tablet Take 1/2 tablet (500 mg total) by mouth 2 (two) times daily for 10 days, THEN 1 tablet (1,000 mg total) 2 (two) times daily. 180 tablet 1   morphine (MS CONTIN) 15 MG 12 hr tablet Take 1 tablet (15 mg total) by mouth every 12 (twelve) hours. 60 tablet 0   pioglitazone (ACTOS) 30 MG tablet Take  1 tablet (30 mg total) by mouth daily. 90 tablet 1   polyethylene glycol (MIRALAX / GLYCOLAX) 17 g packet Take 17 g by mouth 2 (two) times daily. 14 each 0   rosuvastatin (CRESTOR) 20 MG tablet Take 1 tablet (20 mg total) by mouth daily. 15 tablet 1   simethicone (MYLICON) 80 MG chewable tablet Chew 1 tablet (80 mg total) by mouth 4 (four) times daily as needed for flatulence. 60 tablet 0   tadalafil (CIALIS) 20 MG tablet Take 0.5-1 tablets (10-20 mg total) by mouth every other day as needed for erectile dysfunction. 30 tablet 3   tamsulosin  (FLOMAX) 0.4 MG CAPS capsule Take 2 capsules (0.8 mg total) by mouth daily. 60 capsule 5   tiZANidine (ZANAFLEX) 4 MG tablet Take 1 tablet (4 mg total) by mouth 2 (two) times daily. 60 tablet 5   Zinc Sulfate 220 (50 Zn) MG TABS Take 1 tablet (220 mg total) by mouth daily. 100 tablet 5   naloxone (NARCAN) nasal spray 4 mg/0.1 mL Use as directed (Patient not taking: Reported on 06/02/2022) 2 each 1   ondansetron (ZOFRAN) 4 MG tablet TAKE 1 TABLET BY MOUTH EVERY 8 HOURS AS NEEDED FOR NAUSEA/VOMITING. CAN CAUSE CONSTIPATION- MONITOR (Patient not taking: Reported on 06/02/2022) 90 tablet 1   promethazine (PHENERGAN) 12.5 MG tablet Take 1 tablet (12.5 mg total) by mouth every 6 (six) hours as needed for nausea or vomiting. (Patient not taking: Reported on 06/02/2022) 90 tablet 5   senna (SENOKOT) 8.6 MG TABS tablet Take 3 tablets (25.8 mg total) by mouth 2 (two) times daily. (Patient not taking: Reported on 06/02/2022) 270 tablet 0   No current facility-administered medications on file prior to visit.    Subjective: Here for follow up in the setting of deep lumbar wound infection associated with hardware. He is taking cefadroxil 500 mg PO BID without any issues. Denies fevers, chills and sweats. Denies nausea, vomiting and diarrhea. Has soreness in the back wound and rt buttock. Back pain is mostly at 3-5 1/0 but can go up to 9/10 when he sits for a prolonged time or walks long. Reports weight gain of approx 18 lbs.  He has been closely following with wound care, last seen 12/21 and no concerns noted. He is also following PM and R for pain control for back pain. Seen by PCP 2/19 where lab work was done, BMP unremarkable. He has a CT L spine scheduled for 3/6 by neurosurgery due to concerns for soreness at the rt buttock and evaluate for appropriate positioning of hardware per his report.    Review of Systems: All other systems reviewed  with pertinent positives and negatives as listed above  Past Medical  History:  Diagnosis Date   DDD (degenerative disc disease), lumbar    Diabetes mellitus    Hyperlipemia    Hypertension    Obesity    OSA (obstructive sleep apnea)    on CPAP   Past Surgical History:  Procedure Laterality Date   APPLICATION OF ROBOTIC ASSISTANCE FOR SPINAL PROCEDURE N/A 01/11/2021   Procedure: APPLICATION OF ROBOTIC ASSISTANCE FOR SPINAL PROCEDURE;  Surgeon: Judith Part, MD;  Location: [facility];  Service: Neurosurgery;  Laterality: N/A;   APPLICATION OF WOUND VAC N/A 01/25/2021   Procedure: APPLICATION OF WOUND VAC;  Surgeon: Dawley, Theodoro Doing, DO;  Location: [facility];  Service: Neurosurgery;  Laterality: N/A;   BACK SURGERY     COLONOSCOPY  2019   LUMBAR WOUND  DEBRIDEMENT N/A 01/25/2021   Procedure: Irrigation and Debridement of lumbar wound, and wound vacuum assisted closure;  Surgeon: Dawley, Theodoro Doing, DO;  Location: [facility];  Service: Neurosurgery;  Laterality: N/A;   TRANSFORAMINAL LUMBAR INTERBODY FUSION (TLIF) WITH PEDICLE SCREW FIXATION 4 LEVEL N/A 01/11/2021   Procedure: Lumbar one-two, Lumbar two-three, Lumbar three-four, Lumbar four-five, Lumbar five Sacral one Open decompression, Transforaminal lumbar interbody fusion, posterolateral instrumented fusion;  Surgeon: Judith Part, MD;  Location: [facility];  Service: Neurosurgery;  Laterality: N/A;    Social History   Tobacco Use   Smoking status: Never   Smokeless tobacco: Never  Vaping Use   Vaping Use: Never used  Substance Use Topics   Alcohol use: No   Drug use: No    Family History  Problem Relation Age of Onset   Diabetes Mother    Hypertension Neg Hx    Coronary artery disease Neg Hx    Colon cancer Neg Hx    Prostate cancer Neg Hx     Allergies  Allergen Reactions   Cymbalta [Duloxetine Hcl] Other (See Comments)   Penicillins Other (See Comments)    REACTION: Blister and Sores in the mouth    Health Maintenance  Topic Date Due   OPHTHALMOLOGY EXAM  06/12/2017   FOOT EXAM  02/09/2021    COVID-19 Vaccine (5 - 2023-24 season) 12/13/2021   HEMOGLOBIN A1C  12/01/2022   Diabetic kidney evaluation - Urine ACR  01/02/2023   Diabetic kidney evaluation - eGFR measurement  06/03/2023   COLONOSCOPY (Pts 45-71yr Insurance coverage will need to be confirmed)  01/11/2025   DTaP/Tdap/Td (4 - Td or Tdap) 06/02/2032   INFLUENZA VACCINE  Completed   Hepatitis C Screening  Completed   HIV Screening  Completed   Zoster Vaccines- Shingrix  Completed   HPV VACCINES  Aged Out    Objective: BP 120/81   Pulse (!) 103   Temp 97.8 F (36.6 C) (Oral)   Resp 16   Ht '6\' 2"'$  (1.88 m)   Wt (!) 317 lb 12.8 oz (144.2 kg)   SpO2 97%   BMI 40.80 kg/m    Physical Exam Constitutional:      Appearance: Normal appearance. Morbidly obese  HENT:     Head: Normocephalic and atraumatic.      Mouth: Mucous membranes are moist.  Eyes:    Conjunctiva/sclera: Conjunctivae normal.     Pupils: Pupils are equal, round,  Cardiovascular:     Rate and Rhythm: Normal rate and regular rhythm.     Heart sounds:   Pulmonary:     Effort: Pulmonary effort is normal.     Breath sounds:   Abdominal:     General:     Palpations: Abdomen is soft.   Musculoskeletal:        General: Normal range of motion.   Skin:    General: Skin is warm and dry.     Comments: Posterior lumbar wound has healed       Neurological:     General: grossly non focal and ambulatory     Mental Status: awake,  alert and oriented to person, place, and time.   Psychiatric:        Mood and Affect: Mood normal.   Lab Results Lab Results  Component Value Date   WBC 4.6 02/06/2022   HGB 13.3 02/06/2022   HCT 39.4 02/06/2022   MCV 86.0 02/06/2022   PLT 184 02/06/2022    Lab Results  Component Value Date   CREATININE 1.41 06/02/2022   BUN 20 06/02/2022   NA 135 06/02/2022   K 4.0 06/02/2022   CL 99 06/02/2022   CO2 26 06/02/2022    Lab Results  Component Value Date   ALT 19 01/01/2022   AST 16 01/01/2022    ALKPHOS 78 01/01/2022   BILITOT 0.6 01/01/2022    Lab Results  Component Value Date   CHOL 103 01/01/2022   HDL 33.30 (L) 01/01/2022   LDLCALC 41 01/01/2022   LDLDIRECT 118.0 09/14/2020   TRIG 147.0 01/01/2022   CHOLHDL 3 01/01/2022   No results found for: "LABRPR", "RPRTITER" No results found for: "HIV1RNAQUANT", "HIV1RNAVL", "CD4TABS"   Assessment/Plan 57 Y O male with PMH of DM, HLD, HTN, Obesity/OSA and DDD s/p TLIFwith pedicle screw fixation on [id number]  complicated with   # Klebsiella Lumbar Discitis and osteomyelitis complicated with hardware S/p prolonged IV cefazolin>> 12/29 switched to IV ceftriaxone  until 04/30/21 followed by PO suppression with cefadroxil due to + of hardware  # Medication Monitoring   Continue cefadroxil '500mg'$  PO BID for PO suppression due to presence of hardware 2/18 BMP reviewed, discussed to do CBC with PCP at next visit Follow-up with wound care for back wound Discussed to inform us once CT L spine is done.  Follow-up with [name] in 4  months   # Chronic back pain  - s/p trigger point injections in the left lumbar paraspinal area  - On gabapentin, tizanidine and morphine - Follow with Dr Dagoberto Ligas  I have personally spent 40 minutes involved in face-to-face and non-face-to-face activities for this patient on the day of the visit including coordination of care with other specialist and counseling the patient/family.  Wilber Oliphant, [facility] 06/09/2022, 8:19 AM

## 2022-06-16 ENCOUNTER — Encounter: Payer: 59 | Attending: Physical Medicine and Rehabilitation | Admitting: Physical Medicine and Rehabilitation

## 2022-06-16 ENCOUNTER — Encounter: Payer: Self-pay | Admitting: Physical Medicine and Rehabilitation

## 2022-06-16 ENCOUNTER — Other Ambulatory Visit (HOSPITAL_COMMUNITY): Payer: Self-pay

## 2022-06-16 VITALS — BP 121/75 | HR 103 | Ht 74.0 in | Wt 319.0 lb

## 2022-06-16 DIAGNOSIS — Z79891 Long term (current) use of opiate analgesic: Secondary | ICD-10-CM | POA: Diagnosis not present

## 2022-06-16 DIAGNOSIS — G894 Chronic pain syndrome: Secondary | ICD-10-CM | POA: Insufficient documentation

## 2022-06-16 DIAGNOSIS — Z5181 Encounter for therapeutic drug level monitoring: Secondary | ICD-10-CM | POA: Insufficient documentation

## 2022-06-16 DIAGNOSIS — M4646 Discitis, unspecified, lumbar region: Secondary | ICD-10-CM | POA: Diagnosis not present

## 2022-06-16 MED ORDER — HYDROCODONE-ACETAMINOPHEN 5-325 MG PO TABS
1.0000 | ORAL_TABLET | Freq: Four times a day (QID) | ORAL | 0 refills | Status: DC | PRN
  Filled 2022-06-16: qty 90, 23d supply, fill #0

## 2022-06-16 MED ORDER — MORPHINE SULFATE ER 15 MG PO TBCR
15.0000 mg | EXTENDED_RELEASE_TABLET | Freq: Two times a day (BID) | ORAL | 0 refills | Status: DC
  Filled 2022-06-16 – 2022-07-03 (×3): qty 60, 30d supply, fill #0

## 2022-06-16 NOTE — Patient Instructions (Addendum)
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      Will check ESR, CRP and CBC with diff today and send to ID- Dr Wilber Oliphant- from infectious disease as well since she wanted some labs done, but they weren't a couple of weeks ago.   2. Advised pt that his Cr is 1.41- up from 1.15- so is developing chronic kidney disease, likely from DM not under control.  Needs needs to get better control of CBGs/blood sugars FAST so might be able to get improvement in kidney function.    3. UDS due today- will get done.   4. Need to stop Voltaren for R ankle- can use topical voltaren which is over the counter- can use up to 4x/day.  CANNOT TAKE VOLTAREN PILLS ANYMORE!!!!  5. Doesn't want to restart Oxycodone- since so constipating.   6. Norco/Hydrocodone 5/325 mg up to 3x/day as needed- since oxycodone is so constipating.    7. Doesn't want Zanaflex back for muscle spasms. Didn't feel like was real helpful.    8. CT scan per NSU. Will let us know if anything going on.   9. F/U in 92month- and get wait list for trigger point injections as long as labs/CT OK.   10. Weight can exacerbate pain- has gained 19 lbs- but doesn't cause it.

## 2022-06-16 NOTE — Progress Notes (Signed)
Subjective:    Patient ID: [name], male    DOB: 28-Jun-1965, 57 y.o.   MRN: [id number]  HPI  Pt is a 57 yr old male with lumbar radiculopathy/myelopathy secondary to nerve root compression and bacteremia from Klebsiella pneumonia and large back incision with neurogenic bowel and bladder and chronic pain now as a result- also has chronic osteomyelitis of lumbar spine Here for f/u on incomplete paraplegia and chronic pain.       Has pain again in L low back- 8-9/10- really bothering him, when walks, moves.   Scheduled for CT scan- Wednesday per [facility]  Started/a couple of months since started.  Woke up one day and was there- now comes and goes Doesn't matter if standing, sitting- only thing that gets rids of it is laying flat on back.   Didn't check any labs on him- saw PCP 1 month ago  A1c 9.2 from 6.2  Cr 1.41 up from 1.15 and BUN 20-  Getting kidney damage from DM  Hasn't got a CRP/ESR since 10/23.  Did see ID last week-   Sees Cards in April.   Just taking the MS Contin 2x/day-  15 mg- but pain has been worse.   Able to do functional things.   Wants trigger point injections- explained need to know labs before does it as well as need prior auth from insurance to do.    Stopped taking Zanaflex- the muscle relaxant.     Pain Inventory Average Pain 5 Pain Right Now 6 My pain is constant and stabbing  In the last 24 hours, has pain interfered with the following? General activity 2 Relation with others 2 Enjoyment of life 2 What TIME of day is your pain at its worst? morning , daytime, evening, night, and varies Sleep (in general) Fair  Pain is worse with: sitting Pain improves with: rest and medication Relief from Meds: 10  Family History  Problem Relation Age of Onset   Diabetes Mother    Hypertension Neg Hx    Coronary artery disease Neg Hx    Colon cancer Neg Hx    Prostate cancer Neg Hx    Social History   Socioeconomic History   Marital  status: Married    Spouse name: [name]   Number of children: 2   Years of education: Not on file   Highest education level: Not on file  Occupational History   Occupation: retired-disability    Fish farm manager: [employer]    Employer: [employer]  Tobacco Use   Smoking status: Never   Smokeless tobacco: Never  Vaping Use   Vaping Use: Never used  Substance and Sexual Activity   Alcohol use: No   Drug use: No   Sexual activity: Yes  Other Topics Concern   Not on file  Social History Narrative   Remarried for the 3rd time w/ a lady for the [place]   Children:  2010 , 2012   Left handed                Social Determinants of Health   Financial Resource Strain: Not on file  Food Insecurity: Not on file  Transportation Needs: Not on file  Physical Activity: Not on file  Stress: Not on file  Social Connections: Not on file   Past Surgical History:  Procedure Laterality Date   APPLICATION OF ROBOTIC ASSISTANCE FOR SPINAL PROCEDURE N/A 01/11/2021   Procedure: [place];  Surgeon: Judith Part, MD;  Location: [place];  Service: Neurosurgery;  Laterality: N/A;   APPLICATION OF WOUND VAC N/A 01/25/2021   Procedure: APPLICATION OF WOUND VAC;  Surgeon: Dawley, Theodoro Doing, DO;  Location: [place];  Service: Neurosurgery;  Laterality: N/A;   BACK SURGERY     COLONOSCOPY  2019   LUMBAR WOUND DEBRIDEMENT N/A 01/25/2021   Procedure: Irrigation and Debridement of lumbar wound, and wound vacuum assisted closure;  Surgeon: Dawley, Theodoro Doing, DO;  Location: [place];  Service: Neurosurgery;  Laterality: N/A;   TRANSFORAMINAL LUMBAR INTERBODY FUSION (TLIF) WITH PEDICLE SCREW FIXATION 4 LEVEL N/A 01/11/2021   Procedure: Lumbar one-two, Lumbar two-three, Lumbar three-four, Lumbar four-five, Lumbar five Sacral one Open decompression, Transforaminal lumbar interbody fusion, posterolateral instrumented fusion;  Surgeon: Judith Part, MD;   Location: [place];  Service: Neurosurgery;  Laterality: N/A;   Past Surgical History:  Procedure Laterality Date   APPLICATION OF ROBOTIC ASSISTANCE FOR SPINAL PROCEDURE N/A 01/11/2021   Procedure: APPLICATION OF ROBOTIC ASSISTANCE FOR SPINAL PROCEDURE;  Surgeon: Judith Part, MD;  Location: [place];  Service: Neurosurgery;  Laterality: N/A;   APPLICATION OF WOUND VAC N/A 01/25/2021   Procedure: APPLICATION OF WOUND VAC;  Surgeon: Dawley, Theodoro Doing, DO;  Location: [place];  Service: Neurosurgery;  Laterality: N/A;   BACK SURGERY     COLONOSCOPY  2019   LUMBAR WOUND DEBRIDEMENT N/A 01/25/2021   Procedure: Irrigation and Debridement of lumbar wound, and wound vacuum assisted closure;  Surgeon: Dawley, Theodoro Doing, DO;  Location: [place];  Service: Neurosurgery;  Laterality: N/A;   TRANSFORAMINAL LUMBAR INTERBODY FUSION (TLIF) WITH PEDICLE SCREW FIXATION 4 LEVEL N/A 01/11/2021   Procedure: Lumbar one-two, Lumbar two-three, Lumbar three-four, Lumbar four-five, Lumbar five Sacral one Open decompression, Transforaminal lumbar interbody fusion, posterolateral instrumented fusion;  Surgeon: Judith Part, MD;  Location: [place];  Service: Neurosurgery;  Laterality: N/A;   Past Medical History:  Diagnosis Date   DDD (degenerative disc disease), lumbar    Diabetes mellitus    Hyperlipemia    Hypertension    Obesity    OSA (obstructive sleep apnea)    on CPAP   Ht '6\' 2"'$  (1.88 m)   Wt (!) 319 lb (144.7 kg)   BMI 40.96 kg/m   Opioid Risk Score:   Fall Risk Score:  `1  Depression screen [facility] 2/9     06/16/2022    9:41 AM 06/09/2022    8:21 AM 06/02/2022    8:23 AM 01/01/2022    9:28 AM 12/04/2021   11:08 AM 10/01/2021   10:34 AM 08/14/2021   10:43 AM  Depression screen PHQ 2/9  Decreased Interest 0 0 0 0 0 0 0  Down, Depressed, Hopeless 0 0 0 0 0 0 0  PHQ - 2 Score 0 0 0 0 0 0 0    Review of Systems  Musculoskeletal:  Positive for back pain.  All other systems reviewed and are negative.       Objective:   Physical Exam  Awake, alert, appropriate, has gained some weight, NAD       Assessment & Plan:   Pt is a 57 yr old male with lumbar radiculopathy/myelopathy secondary to nerve root compression and bacteremia from Klebsiella pneumonia and large back incision with neurogenic bowel and bladder and chronic pain now as a result- also has chronic osteomyelitis of lumbar spine Here for f/u on incomplete paraplegia and chronic pain.  Will check ESR, CRP and CBC with diff today and send to ID- Dr Wilber Oliphant- from infectious disease as well since she wanted some labs done, but they weren't a couple of weeks ago.   2. Advised pt that his Cr is 1.41- up from 1.15- so is developing chronic kidney disease, likely from DM not under control.  Needs needs to get better control of CBGs/blood sugars FAST so might be able to get improvement in kidney function.    3. UDS due today- will get done.   4. Need to stop Voltaren for R ankle- can use topical voltaren which is over the counter- can use up to 4x/day.  CANNOT TAKE VOLTAREN PILLS ANYMORE!!!!  5. Doesn't want to restart Oxycodone- since so constipating.   6. Norco/Hydrocodone 5/325 mg up to 3x/day as needed- since oxycodone is so constipating.    7. Doesn't want Zanaflex back for muscle spasms. Didn't feel like was real helpful.    8. CT scan per [facility]. Will let us know if anything going on.   9. F/U in 16month- and get wait list for trigger point injections as long as labs/CT OK.   10. Weight can exacerbate pain, not cause it, obviously.    I spent a total of  31  minutes on total care today- >50% coordination of care- due to d/w pt about labs; about pain meds options and decided on norco- and NOT to take Voltaren pills anymore.

## 2022-06-17 ENCOUNTER — Other Ambulatory Visit (HOSPITAL_COMMUNITY): Payer: Self-pay

## 2022-06-17 LAB — CBC WITH DIFFERENTIAL/PLATELET
Basophils Absolute: 0.1 10*3/uL (ref 0.0–0.2)
Basos: 1 %
EOS (ABSOLUTE): 0.3 10*3/uL (ref 0.0–0.4)
Eos: 4 %
Hematocrit: 41.9 % (ref 37.5–51.0)
Hemoglobin: 14 g/dL (ref 13.0–17.7)
Immature Grans (Abs): 0 10*3/uL (ref 0.0–0.1)
Immature Granulocytes: 0 %
Lymphocytes Absolute: 1 10*3/uL (ref 0.7–3.1)
Lymphs: 15 %
MCH: 29.3 pg (ref 26.6–33.0)
MCHC: 33.4 g/dL (ref 31.5–35.7)
MCV: 88 fL (ref 79–97)
Monocytes Absolute: 0.4 10*3/uL (ref 0.1–0.9)
Monocytes: 6 %
Neutrophils Absolute: 4.7 10*3/uL (ref 1.4–7.0)
Neutrophils: 74 %
Platelets: 216 10*3/uL (ref 150–450)
RBC: 4.78 x10E6/uL (ref 4.14–5.80)
RDW: 13.3 % (ref 11.6–15.4)
WBC: 6.4 10*3/uL (ref 3.4–10.8)

## 2022-06-17 LAB — C-REACTIVE PROTEIN: CRP: 1 mg/L (ref 0–10)

## 2022-06-17 LAB — SEDIMENTATION RATE: Sed Rate: 9 mm/hr (ref 0–30)

## 2022-06-18 ENCOUNTER — Ambulatory Visit
Admission: RE | Admit: 2022-06-18 | Discharge: 2022-06-18 | Disposition: A | Discharge status: Home / Self Care | Payer: Self-pay | Source: Ambulatory Visit | Attending: Physician Assistant | Admitting: Physician Assistant

## 2022-06-18 ENCOUNTER — Encounter: Payer: Self-pay | Admitting: Infectious Diseases

## 2022-06-18 DIAGNOSIS — M4326 Fusion of spine, lumbar region: Secondary | ICD-10-CM

## 2022-06-18 DIAGNOSIS — M4807 Spinal stenosis, lumbosacral region: Secondary | ICD-10-CM | POA: Diagnosis not present

## 2022-06-19 ENCOUNTER — Encounter: Payer: Self-pay | Admitting: Infectious Diseases

## 2022-06-19 ENCOUNTER — Encounter: Payer: Self-pay | Admitting: Physical Medicine and Rehabilitation

## 2022-06-19 ENCOUNTER — Encounter: Payer: Self-pay | Admitting: Internal Medicine

## 2022-06-19 LAB — TOXASSURE SELECT,+ANTIDEPR,UR

## 2022-06-24 ENCOUNTER — Other Ambulatory Visit (HOSPITAL_COMMUNITY): Payer: Self-pay

## 2022-06-24 ENCOUNTER — Other Ambulatory Visit: Payer: Self-pay | Admitting: Cardiology

## 2022-06-24 DIAGNOSIS — I2510 Atherosclerotic heart disease of native coronary artery without angina pectoris: Secondary | ICD-10-CM

## 2022-06-24 MED ORDER — ROSUVASTATIN CALCIUM 20 MG PO TABS
20.0000 mg | ORAL_TABLET | Freq: Every day | ORAL | 0 refills | Status: DC
  Filled 2022-06-24: qty 15, 15d supply, fill #0

## 2022-06-30 ENCOUNTER — Other Ambulatory Visit (HOSPITAL_COMMUNITY): Payer: Self-pay

## 2022-07-01 ENCOUNTER — Other Ambulatory Visit (HOSPITAL_COMMUNITY): Payer: Self-pay

## 2022-07-02 ENCOUNTER — Telehealth: Payer: Self-pay | Admitting: *Deleted

## 2022-07-02 NOTE — Telephone Encounter (Signed)
Urine drug screen for this encounter is consistent for prescribed medication 

## 2022-07-03 ENCOUNTER — Other Ambulatory Visit: Payer: Self-pay

## 2022-07-03 ENCOUNTER — Other Ambulatory Visit (HOSPITAL_COMMUNITY): Payer: Self-pay

## 2022-07-10 DIAGNOSIS — Z6841 Body Mass Index (BMI) 40.0 and over, adult: Secondary | ICD-10-CM | POA: Diagnosis not present

## 2022-07-10 DIAGNOSIS — M960 Pseudarthrosis after fusion or arthrodesis: Secondary | ICD-10-CM | POA: Diagnosis not present

## 2022-07-10 DIAGNOSIS — M5432 Sciatica, left side: Secondary | ICD-10-CM | POA: Diagnosis not present

## 2022-07-14 ENCOUNTER — Encounter: Payer: 59 | Attending: Physical Medicine and Rehabilitation | Admitting: Physical Medicine and Rehabilitation

## 2022-07-14 ENCOUNTER — Encounter: Payer: Self-pay | Admitting: Physical Medicine and Rehabilitation

## 2022-07-14 ENCOUNTER — Other Ambulatory Visit (HOSPITAL_COMMUNITY): Payer: Self-pay

## 2022-07-14 VITALS — BP 126/77 | HR 113 | Ht 74.0 in | Wt 318.8 lb

## 2022-07-14 DIAGNOSIS — T847XXS Infection and inflammatory reaction due to other internal orthopedic prosthetic devices, implants and grafts, sequela: Secondary | ICD-10-CM | POA: Diagnosis not present

## 2022-07-14 DIAGNOSIS — G894 Chronic pain syndrome: Secondary | ICD-10-CM | POA: Insufficient documentation

## 2022-07-14 DIAGNOSIS — M5416 Radiculopathy, lumbar region: Secondary | ICD-10-CM

## 2022-07-14 DIAGNOSIS — M7918 Myalgia, other site: Secondary | ICD-10-CM | POA: Diagnosis not present

## 2022-07-14 MED ORDER — MORPHINE SULFATE ER 15 MG PO TBCR
15.0000 mg | EXTENDED_RELEASE_TABLET | Freq: Two times a day (BID) | ORAL | 0 refills | Status: DC
  Filled 2022-07-14 – 2022-08-04 (×2): qty 60, 30d supply, fill #0

## 2022-07-14 MED ORDER — LIDOCAINE HCL 1 % IJ SOLN
3.0000 mL | Freq: Once | INTRAMUSCULAR | Status: AC
  Administered 2022-07-14: 3 mL

## 2022-07-14 NOTE — Patient Instructions (Signed)
Plan: Suggest walking >1 mile 4-5+ days/week to reduce pain/spasms.    2. Patient here for trigger point injections for trigger points/myofascial pain  Consent done and on chart.  Cleaned areas with alcohol and injected using a 27 gauge 1.5 inch needle  Injected 3cc- nothing wasted Using 1% Lidocaine with no EPI  Upper traps Levators Posterior scalenes Middle scalenes Splenius Capitus Pectoralis Major Rhomboids Infraspinatus Teres Major/minor Thoracic paraspinals Lumbar paraspinals L side x2 Other injections- Sacral paraspinals x3 on L   Patient's level of pain prior was 8/10 Current level of pain after injections is- 4/10  There was no bleeding or complications.  Patient was advised to drink a lot of water on day after injections to flush system Will have increased soreness for 12-48 hours after injections.  Can use Lidocaine patches the day AFTER injections Can use theracane on day of injections in places didn't inject Can use heating pad 4-6 hours AFTER injections  3. Will send another MS Contin- due to be refilled 07/30/22- - so doesn't have to call for new Rx.    4. Con't Norco as needed- can call for refill when needed.    5. Let me if injections work some.   6. F/U 2 months- for TrP injections

## 2022-07-14 NOTE — Progress Notes (Signed)
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      And for trigger point injections.    Not taking Norco very much-  The pain hasn't gotten that bad lately.  Starting to get worse last 2 weeks.  Saw Dr Mellody Drown- and 2 screws are broken.   Actually saw PA and Dr Jenetta Downer came in to speak with him.    Trying to make operation "last resort".  Trying to do a fluoroscopy injection will calm down pain- and see if that will work.  Doesn't know when the screws broke.  If has to do surgery, would need to put rods in- wouldn't need to leave open but hoping the pain will subside.   Says the knot of trigger points has come back- when saw  me 1 month ago when it started.  But when exercises, and does a lot of walking, the knot gets better.    Plan: Suggest walking >1 mile 4-5+ days/week to reduce pain/spasms.    2. Patient here for trigger point injections for trigger points/myofascial pain  Consent done and on chart.  Cleaned areas with alcohol and injected using a 27 gauge 1.5 inch needle  Injected 3cc- nothing wasted Using 1% Lidocaine with no EPI  Upper traps Levators Posterior scalenes Middle scalenes Splenius Capitus Pectoralis Major Rhomboids Infraspinatus Teres Major/minor Thoracic paraspinals Lumbar paraspinals L side x2 Other injections- Sacral paraspinals x3 on L   Patient's level of pain prior was 8/10 Current level of pain after injections is- 4/10  There was no bleeding or complications.  Patient was advised to drink a lot of water on day after injections to flush system Will have increased soreness for 12-48 hours after injections.  Can use Lidocaine patches the day AFTER  injections Can use theracane on day of injections in places didn't inject Can use heating pad 4-6 hours AFTER injections  3. Will send another MS Contin- due to be refilled 07/30/22- - so doesn't have to call for new Rx.    4. Con't Norco as needed- can call for refill when needed.    5. Let me if injections work some.   6. F/U 2 months- for TrP injections   I spent a total of  22  minutes on total care today- >50% coordination of care- due to 10 minutes on injections and 12 minutes discussing pain meds-

## 2022-07-21 ENCOUNTER — Other Ambulatory Visit: Payer: Self-pay

## 2022-07-24 DIAGNOSIS — M5416 Radiculopathy, lumbar region: Secondary | ICD-10-CM | POA: Diagnosis not present

## 2022-07-24 NOTE — Progress Notes (Signed)
HPI: Follow-up coronary artery disease. Calcium score 3/14 94.7.  Nuclear study July 2022 showed ejection fraction 61% and no ischemia.  Echocardiogram October 2022 showed normal LV function.  Since last seen the patient denies any dyspnea on exertion, orthopnea, PND, pedal edema, palpitations, syncope or chest pain.   Current Outpatient Medications  Medication Sig Dispense Refill   ascorbic acid (VITAMIN C) 500 MG tablet Take 1 tablet (500 mg total) by mouth 2 (two) times daily. 100 tablet 3   Blood Glucose Monitoring Suppl (FREESTYLE LITE) w/Device KIT Use to check blood sugar up to 4 times daily as directed 1 kit 0   Blood Glucose Monitoring Suppl (ONETOUCH VERIO FLEX SYSTEM) w/Device KIT Test blood sugar twice a day.  Dx code: E11.9 1 kit 0   cefadroxil (DURICEF) 500 MG capsule Take 1 capsule (500 mg total) by mouth 2 (two) times daily. 60 capsule 11   cyclobenzaprine (FLEXERIL) 10 MG tablet Take 1 tablet (10 mg total) by mouth 3 (three) times daily as needed for muscle spasms. 90 tablet 0   fluticasone (FLONASE) 50 MCG/ACT nasal spray Place into the nose.     gabapentin (NEURONTIN) 600 MG tablet Take 1 tablet (600 mg total) by mouth 3 (three) times daily. 270 tablet 1   glucose blood (FREESTYLE LITE) test strip Use 1 strip to check blood sugar up to 4 times daily as directed 200 each 0   glucose blood (ONETOUCH VERIO) test strip Check blood sugars before meals and at bedtime.  twice daily 200 strip 12   HYDROcodone-acetaminophen (NORCO) 5-325 MG tablet Take 1 tablet by mouth every 6 (six) hours as needed (breakthrough pain as needed). 90 tablet 0   Lancets (FREESTYLE) lancets Use 1 lancet to check blood sugar up to 4 times daily as directed 200 each 0   losartan-hydrochlorothiazide (HYZAAR) 100-12.5 MG tablet Take 1 tablet by mouth daily. 90 tablet 1   metFORMIN (GLUCOPHAGE) 1000 MG tablet Take 1/2 tablet (500 mg total) by mouth 2 (two) times daily for 10 days, THEN 1 tablet (1,000 mg  total) 2 (two) times daily. 180 tablet 1   morphine (MS CONTIN) 15 MG 12 hr tablet Take 1 tablet (15 mg total) by mouth every 12 (twelve) hours 60 tablet 0   naloxone (NARCAN) nasal spray 4 mg/0.1 mL Use as directed 2 each 1   ondansetron (ZOFRAN) 4 MG tablet TAKE 1 TABLET BY MOUTH EVERY 8 HOURS AS NEEDED FOR NAUSEA/VOMITING. CAN CAUSE CONSTIPATION- MONITOR 90 tablet 1   pioglitazone (ACTOS) 30 MG tablet Take 1 tablet (30 mg total) by mouth daily. 90 tablet 1   polyethylene glycol (MIRALAX / GLYCOLAX) 17 g packet Take 17 g by mouth 2 (two) times daily. 14 each 0   promethazine (PHENERGAN) 12.5 MG tablet Take 1 tablet (12.5 mg total) by mouth every 6 (six) hours as needed for nausea or vomiting. 90 tablet 5   rosuvastatin (CRESTOR) 20 MG tablet Take 1 tablet (20 mg total) by mouth daily. 15 tablet 0   senna (SENOKOT) 8.6 MG TABS tablet Take 3 tablets (25.8 mg total) by mouth 2 (two) times daily. 270 tablet 0   simethicone (MYLICON) 80 MG chewable tablet Chew 1 tablet (80 mg total) by mouth 4 (four) times daily as needed for flatulence. 60 tablet 0   tadalafil (CIALIS) 20 MG tablet Take 0.5-1 tablets (10-20 mg total) by mouth every other day as needed for erectile dysfunction. 30 tablet 3   tamsulosin (  FLOMAX) 0.4 MG CAPS capsule Take 2 capsules (0.8 mg total) by mouth daily. 60 capsule 5   tiZANidine (ZANAFLEX) 4 MG tablet Take 1 tablet (4 mg total) by mouth 2 (two) times daily. 60 tablet 5   Zinc Sulfate 220 (50 Zn) MG TABS Take 1 tablet (220 mg total) by mouth daily. 100 tablet 5   No current facility-administered medications for this visit.     Past Medical History:  Diagnosis Date   DDD (degenerative disc disease), lumbar    Diabetes mellitus    Hyperlipemia    Hypertension    Obesity    OSA (obstructive sleep apnea)    on CPAP    Past Surgical History:  Procedure Laterality Date   APPLICATION OF ROBOTIC ASSISTANCE FOR SPINAL PROCEDURE N/A 01/11/2021   Procedure: APPLICATION OF  ROBOTIC ASSISTANCE FOR SPINAL PROCEDURE;  Surgeon: Jadene Pierinistergard, Thomas A, MD;  [facility];  Service: Neurosurgery;  Laterality: N/A;   APPLICATION OF WOUND VAC N/A 01/25/2021   Procedure: APPLICATION OF WOUND VAC;  Surgeon: Dawley, Alan Mulderroy C, DO;  Location: [facility];  Service: Neurosurgery;  Laterality: N/A;   BACK SURGERY     COLONOSCOPY  2019   LUMBAR WOUND DEBRIDEMENT N/A 01/25/2021   Procedure: Irrigation and Debridement of lumbar wound, and wound vacuum assisted closure;  Surgeon: Dawley, Alan Mulderroy C, DO;  Location: [facility];  Service: Neurosurgery;  Laterality: N/A;   TRANSFORAMINAL LUMBAR INTERBODY FUSION (TLIF) WITH PEDICLE SCREW FIXATION 4 LEVEL N/A 01/11/2021   Procedure: Lumbar one-two, Lumbar two-three, Lumbar three-four, Lumbar four-five, Lumbar five Sacral one Open decompression, Transforaminal lumbar interbody fusion, posterolateral instrumented fusion;  Surgeon: Jadene Pierinistergard, Thomas A, MD;  Location: [facility];  Service: Neurosurgery;  Laterality: N/A;    Social History   Socioeconomic History   Marital status: Married    Spouse name: Shanda BumpsJessica   Number of children: 2   Years of education: Not on file   Highest education level: Not on file  Occupational History   Occupation: retired-disability    Associate Professormployer: [employer]    Employer: [employer]  Tobacco Use   Smoking status: Never   Smokeless tobacco: Never  Vaping Use   Vaping Use: Never used  Substance and Sexual Activity   Alcohol use: No   Drug use: No   Sexual activity: Yes  Other Topics Concern   Not on file  Social History Narrative   Remarried for the 3rd time w/ a lady for the [place]   Children:  2010 , 2012   Left handed                Social Determinants of Health   Financial Resource Strain: Not on file  Food Insecurity: Not on file  Transportation Needs: Not on file  Physical Activity: Not on file  Stress: Not on file  Social Connections: Not on file  Intimate Partner Violence: Not on  file    Family History  Problem Relation Age of Onset   Diabetes Mother    Hypertension Neg Hx    Coronary artery disease Neg Hx    Colon cancer Neg Hx    Prostate cancer Neg Hx     ROS: no fevers or chills, productive cough, hemoptysis, dysphasia, odynophagia, melena, hematochezia, dysuria, hematuria, rash, seizure activity, orthopnea, PND, pedal edema, claudication. Remaining systems are negative.  Physical Exam: Well-developed well-nourished in no acute distress.  Skin is warm and dry.  HEENT is normal.  Neck is supple.  Chest is clear to auscultation with normal expansion.  Cardiovascular exam is regular rate and rhythm.  Abdominal exam nontender or distended. No masses palpated. Extremities show no edema. neuro grossly intact  ECG-sinus tachycardia at a rate of 111, right bundle branch block.  Personally reviewed  A/P  1 coronary artery disease-based on mildly elevated coronary calcium score.  Follow-up nuclear study showed no ischemia.  Plan medical therapy.  Will treat with aspirin and statin.  2 hypertension-blood pressure controlled.  Continue present medical regimen.  3 hyperlipidemia-continue Crestor.  Check lipids and liver.  Olga Millers, MD

## 2022-08-05 ENCOUNTER — Ambulatory Visit: Payer: 59 | Attending: Cardiology | Admitting: Cardiology

## 2022-08-05 ENCOUNTER — Other Ambulatory Visit (HOSPITAL_COMMUNITY): Payer: Self-pay

## 2022-08-05 ENCOUNTER — Other Ambulatory Visit: Payer: Self-pay

## 2022-08-05 ENCOUNTER — Encounter: Payer: Self-pay | Admitting: Cardiology

## 2022-08-05 VITALS — BP 110/64 | HR 111 | Ht 74.0 in | Wt 316.8 lb

## 2022-08-05 DIAGNOSIS — I2510 Atherosclerotic heart disease of native coronary artery without angina pectoris: Secondary | ICD-10-CM

## 2022-08-05 DIAGNOSIS — I10 Essential (primary) hypertension: Secondary | ICD-10-CM | POA: Diagnosis not present

## 2022-08-05 DIAGNOSIS — E7800 Pure hypercholesterolemia, unspecified: Secondary | ICD-10-CM

## 2022-08-05 NOTE — Patient Instructions (Signed)
    Follow-Up: At [facility], you and your health needs are our priority.  As part of our continuing mission to provide you with exceptional heart care, we have created designated Provider Care Teams.  These Care Teams include your primary Cardiologist (physician) and Advanced Practice Providers (APPs -  Physician Assistants and Nurse Practitioners) who all work together to provide you with the care you need, when you need it.  We recommend signing up for the patient portal called "MyChart".  Sign up information is provided on this After Visit Summary.  MyChart is used to connect with patients for Virtual Visits (Telemedicine).  Patients are able to view lab/test results, encounter notes, upcoming appointments, etc.  Non-urgent messages can be sent to your provider as well.   To learn more about what you can do with MyChart, go to [email or web address].    Your next appointment:   12 month(s)  Provider:   BRIAN CRENSHAW MD    

## 2022-08-13 DIAGNOSIS — Z6841 Body Mass Index (BMI) 40.0 and over, adult: Secondary | ICD-10-CM | POA: Diagnosis not present

## 2022-08-13 DIAGNOSIS — M5416 Radiculopathy, lumbar region: Secondary | ICD-10-CM | POA: Diagnosis not present

## 2022-08-13 DIAGNOSIS — M960 Pseudarthrosis after fusion or arthrodesis: Secondary | ICD-10-CM | POA: Diagnosis not present

## 2022-08-18 ENCOUNTER — Other Ambulatory Visit: Payer: Self-pay | Admitting: Cardiology

## 2022-08-18 DIAGNOSIS — I2510 Atherosclerotic heart disease of native coronary artery without angina pectoris: Secondary | ICD-10-CM

## 2022-08-19 ENCOUNTER — Other Ambulatory Visit (HOSPITAL_COMMUNITY): Payer: Self-pay

## 2022-08-19 MED ORDER — ROSUVASTATIN CALCIUM 20 MG PO TABS
20.0000 mg | ORAL_TABLET | Freq: Every day | ORAL | 3 refills | Status: DC
Start: 2022-08-19 — End: 2023-08-27
  Filled 2022-08-19: qty 90, 90d supply, fill #0
  Filled 2022-11-22: qty 90, 90d supply, fill #1
  Filled 2023-03-02: qty 90, 90d supply, fill #2
  Filled 2023-06-02: qty 90, 90d supply, fill #3

## 2022-08-20 ENCOUNTER — Ambulatory Visit: Payer: 59 | Admitting: Physical Medicine and Rehabilitation

## 2022-08-31 ENCOUNTER — Other Ambulatory Visit: Payer: Self-pay | Admitting: Physical Medicine and Rehabilitation

## 2022-09-01 ENCOUNTER — Other Ambulatory Visit: Payer: Self-pay

## 2022-09-02 ENCOUNTER — Other Ambulatory Visit: Payer: Self-pay | Admitting: Internal Medicine

## 2022-09-02 ENCOUNTER — Other Ambulatory Visit (HOSPITAL_COMMUNITY): Payer: Self-pay

## 2022-09-02 MED ORDER — MORPHINE SULFATE ER 15 MG PO TBCR
15.0000 mg | EXTENDED_RELEASE_TABLET | Freq: Two times a day (BID) | ORAL | 0 refills | Status: DC
  Filled 2022-09-02 – 2022-09-14 (×2): qty 60, 30d supply, fill #0

## 2022-09-03 ENCOUNTER — Other Ambulatory Visit (HOSPITAL_COMMUNITY): Payer: Self-pay

## 2022-09-03 MED ORDER — METFORMIN HCL 1000 MG PO TABS
1000.0000 mg | ORAL_TABLET | Freq: Two times a day (BID) | ORAL | 1 refills | Status: DC
  Filled 2022-09-03: qty 180, 90d supply, fill #0
  Filled 2022-12-08: qty 180, 90d supply, fill #1

## 2022-09-15 ENCOUNTER — Encounter: Payer: Self-pay | Admitting: Physical Medicine and Rehabilitation

## 2022-09-15 ENCOUNTER — Encounter: Payer: 59 | Attending: Physical Medicine and Rehabilitation | Admitting: Physical Medicine and Rehabilitation

## 2022-09-15 ENCOUNTER — Other Ambulatory Visit (HOSPITAL_COMMUNITY): Payer: Self-pay

## 2022-09-15 ENCOUNTER — Ambulatory Visit: Payer: 59 | Admitting: Internal Medicine

## 2022-09-15 VITALS — BP 96/62 | HR 114 | Temp 97.5°F | Ht 74.0 in | Wt 318.0 lb

## 2022-09-15 DIAGNOSIS — G8929 Other chronic pain: Secondary | ICD-10-CM | POA: Diagnosis not present

## 2022-09-15 DIAGNOSIS — M5106 Intervertebral disc disorders with myelopathy, lumbar region: Secondary | ICD-10-CM | POA: Diagnosis not present

## 2022-09-15 DIAGNOSIS — M159 Polyosteoarthritis, unspecified: Secondary | ICD-10-CM | POA: Diagnosis not present

## 2022-09-15 DIAGNOSIS — M7918 Myalgia, other site: Secondary | ICD-10-CM | POA: Insufficient documentation

## 2022-09-15 DIAGNOSIS — M4646 Discitis, unspecified, lumbar region: Secondary | ICD-10-CM | POA: Insufficient documentation

## 2022-09-15 MED ORDER — TIZANIDINE HCL 4 MG PO TABS
4.0000 mg | ORAL_TABLET | Freq: Two times a day (BID) | ORAL | 5 refills | Status: DC
  Filled 2022-09-15: qty 60, 30d supply, fill #0
  Filled 2022-10-10 (×2): qty 60, 30d supply, fill #1
  Filled 2022-11-09: qty 60, 30d supply, fill #2
  Filled 2022-12-22: qty 60, 30d supply, fill #3
  Filled 2023-02-16: qty 60, 30d supply, fill #4

## 2022-09-15 MED ORDER — LIDOCAINE HCL 1 % IJ SOLN
3.0000 mL | Freq: Once | INTRAMUSCULAR | Status: AC
Start: 2022-09-15 — End: 2022-09-15
  Administered 2022-09-15: 3 mL

## 2022-09-15 NOTE — Addendum Note (Signed)
Addended by: Genice Rouge on: 09/15/2022 10:41 AM   Modules accepted: Orders

## 2022-09-15 NOTE — Addendum Note (Signed)
Addended by: Becky Sax on: 09/15/2022 10:04 AM   Modules accepted: Orders

## 2022-09-15 NOTE — Patient Instructions (Signed)
Plan:  Patient here for trigger point injections for myofascial pain  Consent done and on chart.  Cleaned areas with alcohol and injected using a 27 gauge 1.5 inch needle  Injected  3cc- none wasted Using 1% Lidocaine with no EPI  Upper traps Levators Posterior scalenes Middle scalenes Splenius Capitus Pectoralis Major Rhomboids Infraspinatus Teres Major/minor Thoracic paraspinals Lumbar paraspinals x3 on L Other injections- L paraspinals sacrum- x1   Patient's level of pain prior was 7/10 Current level of pain after injections is- already calmed down to 4/10  There was no bleeding or complications.  Patient was advised to drink a lot of water on day after injections to flush system Will have increased soreness for 12-48 hours after injections.  Can use Lidocaine patches the day AFTER injections Can use theracane on day of injections in places didn't inject Can use heating pad 4-6 hours AFTER injections  2. Con't MS Contin- last 5/21- ready for pick up- today- hasn't gotten Norco since March 4th-  Going to pick it up.    3. Hasn't used Zanaflex in a long time- could help muscle spasms some- Tizanidine 4 mg up to 2x/day as needed- sent in refills  4. F/U in 6 weeks- trigger point injections.

## 2022-09-15 NOTE — Progress Notes (Signed)
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      And for trigger point injections.       2 broken screws- in lumbar spine- trying to avoid surgery-    Got injections- ESI- L side- starting to wear off- due 09/20/22.  Was working- down to 3-4/10 when got ESI.  Plus MS Contin- felt pretty normal for a few weeks. Injection was 08/13/22.   Moving better- doing a lot of walking- knot gets better.  1.5 to 2 miles/day- sometimes more.   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 Received it already.    Plan:  Patient here for trigger point injections for myofascial pain  Consent done and on chart.  Cleaned areas with alcohol and injected using a 27 gauge 1.5 inch needle  Injected  3cc- none wasted Using 1% Lidocaine with no EPI  Upper traps Levators Posterior scalenes Middle scalenes Splenius Capitus Pectoralis Major Rhomboids Infraspinatus Teres Major/minor Thoracic paraspinals Lumbar paraspinals x3 on L Other injections- L paraspinals sacrum- x1   Patient's level of pain prior was 7/10 Current level of pain after injections is- already calmed down to 4/10  There was no bleeding or complications.  Patient was advised to drink a lot of water on day after injections to flush system Will have increased soreness for 12-48 hours after injections.  Can use Lidocaine patches the day AFTER injections Can use theracane on day of injections in places didn't inject Can use heating pad 4-6 hours AFTER injections  2. Con't MS Contin- last 5/21- ready for pick up- today- hasn't gotten Norco since March 4th-  Going to pick it up.    3.  Hasn't used Zanaflex in a long time- could help muscle spasms some- Tizanidine 4 mg up to 2x/day as needed- sent in refills  4. F/U in 6 weeks- trigger point injections. Make a few appointments-    I spent a total of 23   minutes on total care today- >50% coordination of care- due to 7 minutes on injections and rest discussing muscle spasms and how to address.

## 2022-09-17 ENCOUNTER — Encounter: Payer: 59 | Admitting: Physical Medicine and Rehabilitation

## 2022-09-22 ENCOUNTER — Other Ambulatory Visit (HOSPITAL_COMMUNITY): Payer: Self-pay

## 2022-09-22 ENCOUNTER — Ambulatory Visit (INDEPENDENT_AMBULATORY_CARE_PROVIDER_SITE_OTHER): Payer: 59 | Admitting: Internal Medicine

## 2022-09-22 ENCOUNTER — Encounter: Payer: Self-pay | Admitting: Internal Medicine

## 2022-09-22 VITALS — BP 136/80 | HR 100 | Temp 97.9°F | Resp 18 | Ht 74.0 in | Wt 320.4 lb

## 2022-09-22 DIAGNOSIS — Z7984 Long term (current) use of oral hypoglycemic drugs: Secondary | ICD-10-CM | POA: Diagnosis not present

## 2022-09-22 DIAGNOSIS — E118 Type 2 diabetes mellitus with unspecified complications: Secondary | ICD-10-CM

## 2022-09-22 DIAGNOSIS — Z6841 Body Mass Index (BMI) 40.0 and over, adult: Secondary | ICD-10-CM | POA: Diagnosis not present

## 2022-09-22 DIAGNOSIS — I10 Essential (primary) hypertension: Secondary | ICD-10-CM | POA: Diagnosis not present

## 2022-09-22 DIAGNOSIS — E1165 Type 2 diabetes mellitus with hyperglycemia: Secondary | ICD-10-CM | POA: Diagnosis not present

## 2022-09-22 DIAGNOSIS — E7800 Pure hypercholesterolemia, unspecified: Secondary | ICD-10-CM

## 2022-09-22 LAB — HEMOGLOBIN A1C: Hgb A1c MFr Bld: 6.2 % (ref 4.6–6.5)

## 2022-09-22 LAB — BASIC METABOLIC PANEL
BUN: 16 mg/dL (ref 6–23)
CO2: 25 mEq/L (ref 19–32)
Calcium: 9.3 mg/dL (ref 8.4–10.5)
Chloride: 106 mEq/L (ref 96–112)
Creatinine, Ser: 1.12 mg/dL (ref 0.40–1.50)
GFR: 72.06 mL/min (ref >60.00)
Glucose, Bld: 113 mg/dL — ABNORMAL HIGH (ref 70–99)
Potassium: 3.9 mEq/L (ref 3.5–5.1)
Sodium: 141 mEq/L (ref 135–145)

## 2022-09-22 LAB — AST: AST: 12 U/L (ref 0–37)

## 2022-09-22 LAB — ALT: ALT: 15 U/L (ref 0–53)

## 2022-09-22 MED ORDER — LOSARTAN POTASSIUM-HCTZ 50-12.5 MG PO TABS
1.0000 | ORAL_TABLET | Freq: Every day | ORAL | 1 refills | Status: DC
  Filled 2022-09-22: qty 30, 30d supply, fill #0
  Filled 2022-10-15 – 2022-10-28 (×2): qty 30, 30d supply, fill #1

## 2022-09-22 NOTE — Assessment & Plan Note (Addendum)
DM: A1c February 2024 increased from 6.2 to 9.2. Was rec to go back on metformin 1000 mg twice daily and restart pioglitazone 30 mg qd Has a glucometer but not checking CBGs.  Recommend a CGM.  Referred to our pharmacist.  Check A1c, AST ALT. Patient would like to consider a  injectable.  Will wait for results. Morbid obesity: Was doing well for a while.  Reports stress eating.  Counseled.  See above, consider injectables for DM management HTN has been on the low side, holding losartan for 1 week, BP today 136/80. Plan: Check a BMP, decrease Hyzaar to 50/12.5 mg daily, Rx sent.  Consistent 6 weeks Discitis, lumbar osteomyelitis follow-up by ID, physical medicine, neurosurgery.  Pain recently increased, had local injections, fortunately that is helping, scheduled to have another injection soon;  some of those injections have been a steroids. Gabapentin discontinued due to being ineffective. CAD: Saw cardiology, they noted mildly elevated coronary calcium score, follow-up nuclear study showed no ischemia, rx  conservative treatment. RTC 6 weeks

## 2022-09-22 NOTE — Patient Instructions (Addendum)
Restart your blood pressure medication at a lower dose.  Check the  blood pressure regularly BP GOAL is between 110/65 and  135/85. If it is consistently higher or lower, let me know   Diabetes: Until you get device to monitor your sugar, check at least once daily. - early in AM fasting  ( blood sugar goal 70-130) - 2 hours after a meal (blood sugar goal less than 180) - bedtime (goal 90-150)    GO TO THE LAB : Get the blood work     GO TO THE FRONT DESK, PLEASE SCHEDULE YOUR APPOINTMENTS Come back for   a checkup in 6 weeks     Vaccines I recommend: Covid booster  Per our records you are due for your diabetic eye exam. Please contact your eye doctor to schedule an appointment. Please have them send copies of your office visit notes to [name]. Our fax number is [phone]. If you need a referral to an eye doctor please let us know.

## 2022-09-22 NOTE — Progress Notes (Signed)
Subjective:    Patient ID: [name], male    DOB: [birth year], 57 y.o.   MRN: [id number]  DOS:  09/22/2022 Type of visit - description: Follow-up  Chronic medical problems addressed.  DM: Good compliance with current meds, no ambulatory CBGs.  BP is not frequently check but it was low when he went to see another doctor, holding  Hyzaar for 1 week. When BP was low he had no symptoms: No chest pain, no DOE, no difficulty breathing or palpitations.  Chronic back pain not well-controlled, having local injections that so far have helped  Wt Readings from Last 3 Encounters:  09/22/22 (!) 320 lb 6 oz (145.3 kg)  09/15/22 (!) 318 lb (144.2 kg)  08/05/22 (!) 316 lb 12.8 oz (143.7 kg)     Review of Systems See above   Past Medical History:  Diagnosis Date   DDD (degenerative disc disease), lumbar    Diabetes mellitus    Hyperlipemia    Hypertension    Obesity    OSA (obstructive sleep apnea)    on CPAP    Past Surgical History:  Procedure Laterality Date   APPLICATION OF ROBOTIC ASSISTANCE FOR SPINAL PROCEDURE N/A 01/11/2021   Procedure: APPLICATION OF ROBOTIC ASSISTANCE FOR SPINAL PROCEDURE;  Surgeon: Jadene Pierini, MD;  Location: [facility] OR;  Service: Neurosurgery;  Laterality: N/A;   APPLICATION OF WOUND VAC N/A 01/25/2021   Procedure: APPLICATION OF WOUND VAC;  Surgeon: Dawley, Alan Mulder, DO;  Location: [facility] OR;  Service: Neurosurgery;  Laterality: N/A;   BACK SURGERY     COLONOSCOPY  2019   LUMBAR WOUND DEBRIDEMENT N/A 01/25/2021   Procedure: Irrigation and Debridement of lumbar wound, and wound vacuum assisted closure;  Surgeon: Dawley, Alan Mulder, DO;  Location: [facility] OR;  Service: Neurosurgery;  Laterality: N/A;   TRANSFORAMINAL LUMBAR INTERBODY FUSION (TLIF) WITH PEDICLE SCREW FIXATION 4 LEVEL N/A 01/11/2021   Procedure: Lumbar one-two, Lumbar two-three, Lumbar three-four, Lumbar four-five, Lumbar five Sacral one Open decompression, Transforaminal lumbar interbody fusion,  posterolateral instrumented fusion;  Surgeon: Jadene Pierini, MD;  Location: [facility] OR;  Service: Neurosurgery;  Laterality: N/A;    Current Outpatient Medications  Medication Instructions   ascorbic acid (VITAMIN C) 500 mg, Oral, 2 times daily   Blood Glucose Monitoring Suppl (FREESTYLE LITE) w/Device KIT Use to check blood sugar up to 4 times daily as directed   Blood Glucose Monitoring Suppl (ONETOUCH VERIO FLEX SYSTEM) w/Device KIT Test blood sugar twice a day.  Dx code: E11.9   cefadroxil (DURICEF) 500 mg, Oral, 2 times daily   cyclobenzaprine (FLEXERIL) 10 mg, Oral, 3 times daily PRN   fluticasone (FLONASE) 50 MCG/ACT nasal spray Nasal   gabapentin (NEURONTIN) 600 mg, Oral, Daily   glucose blood (FREESTYLE LITE) test strip Use 1 strip to check blood sugar up to 4 times daily as directed   glucose blood (ONETOUCH VERIO) test strip Check blood sugars before meals and at bedtime.  twice daily   HYDROcodone-acetaminophen (NORCO) 5-325 MG tablet 1 tablet, Oral, Every 6 hours PRN   Lancets (FREESTYLE) lancets Use 1 lancet to check blood sugar up to 4 times daily as directed   losartan-hydrochlorothiazide (HYZAAR) 100-12.5 MG tablet 1 tablet, Oral, Daily   metFORMIN (GLUCOPHAGE) 1,000 mg, Oral, 2 times daily with meals   morphine (MS CONTIN) 15 MG 12 hr tablet Take 1 tablet (15 mg total) by mouth every 12 (twelve) hours   naloxone (NARCAN) nasal spray 4 mg/0.1 mL  Use as directed   ondansetron (ZOFRAN) 4 MG tablet TAKE 1 TABLET BY MOUTH EVERY 8 HOURS AS NEEDED FOR NAUSEA/VOMITING. CAN CAUSE CONSTIPATION- MONITOR   pioglitazone (ACTOS) 30 mg, Oral, Daily   polyethylene glycol (MIRALAX / GLYCOLAX) 17 g, Oral, 2 times daily   promethazine (PHENERGAN) 12.5 mg, Oral, Every 6 hours PRN   rosuvastatin (CRESTOR) 20 mg, Oral, Daily   senna (SENOKOT) 25.8 mg, Oral, 2 times daily   simethicone (MYLICON) 80 mg, Oral, 4 times daily PRN   tadalafil (CIALIS) 10-20 mg, Oral, Every 48 hours PRN    tamsulosin (FLOMAX) 0.8 mg, Oral, Daily   tiZANidine (ZANAFLEX) 4 mg, Oral, 2 times daily   Zinc Sulfate 220 mg, Oral, Daily       Objective:   Physical Exam BP 136/80   Pulse 100   Temp 97.9 F (36.6 C) (Oral)   Resp 18   Ht 6\' 2"  (1.88 m)   Wt (!) 320 lb 6 oz (145.3 kg)   SpO2 97%   BMI 41.13 kg/m  General:   Well developed, NAD, BMI noted. HEENT:  Normocephalic . Face symmetric, atraumatic Lungs:  CTA B Normal respiratory effort, no intercostal retractions, no accessory muscle use. Heart: RRR,  no murmur.  Lower extremities: no pretibial edema bilaterally  Skin: Not pale. Not jaundice Neurologic:  alert & oriented X3.  Speech normal, gait appropriate for age and unassisted Psych--  Cognition and judgment appear intact.  Cooperative with normal attention span and concentration.  Behavior appropriate. No anxious or depressed appearing.      Assessment    Assessment DM NO neuropathy per [facility] 07-2020.  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 Was rec to go back on metformin 1000 mg twice daily and restart pioglitazone 30 mg qd Has a glucometer but not checking CBGs.  Recommend a CGM.  Referred to our pharmacist.  Check A1c, AST ALT. Patient would like to consider a  injectable.  Will wait for results. Morbid obesity: Was doing well for a while.  Reports stress eating.  Counseled.  See above, consider injectables for DM management HTN has been on the low side, holding losartan for 1 week, BP today 136/80. Plan: Check a BMP, decrease Hyzaar to 50/12.5 mg daily, Rx sent.  Consistent 6 weeks Discitis,  lumbar osteomyelitis follow-up by ID, physical medicine, neurosurgery.  Pain recently increased, had local injections, fortunately that is helping, scheduled to have another injection soon;  some of those injections have been a steroids. Gabapentin discontinued due to being ineffective. CAD: Saw cardiology, they noted mildly elevated coronary calcium score, follow-up nuclear study showed no ischemia, rx  conservative treatment. RTC 6 weeks

## 2022-09-23 ENCOUNTER — Telehealth: Payer: Self-pay

## 2022-09-23 NOTE — Progress Notes (Signed)
   Care Guide Note  09/23/2022 Name: [name] MRN: [id number] DOB: [birth year]  Referred by: Wanda Plump, MD Reason for referral : Care Coordination (Outreach to schedule with pharm d )   [name] is a 57 y.o. year old male who is a primary care patient of Wanda Plump, MD. [name] was referred to the pharmacist for assistance related to DM.    Successful contact was made with the patient to discuss pharmacy services including being ready for the pharmacist to call at least 5 minutes before the scheduled appointment time, to have medication bottles and any blood sugar or blood pressure readings ready for review. The patient agreed to meet with the pharmacist via with the pharmacist via telephone visit on (date/time).  10/06/2022   [name], RMA Care Guide [facility]  [place], [place] [address] Direct Dial: [phone] [email or web address]

## 2022-10-01 ENCOUNTER — Other Ambulatory Visit (HOSPITAL_BASED_OUTPATIENT_CLINIC_OR_DEPARTMENT_OTHER): Payer: Self-pay

## 2022-10-01 ENCOUNTER — Ambulatory Visit: Payer: 59 | Admitting: Pharmacist

## 2022-10-01 DIAGNOSIS — E66813 Obesity, class 3: Secondary | ICD-10-CM

## 2022-10-01 DIAGNOSIS — E118 Type 2 diabetes mellitus with unspecified complications: Secondary | ICD-10-CM

## 2022-10-01 NOTE — Progress Notes (Signed)
10/01/2022 Name: [name] MRN: [id number] DOB: [birth year]  Chief Complaint  Patient presents with   Diabetes    [name] is a 57 y.o. year old male who presented for a telephone visit.   They were referred to the pharmacist by their PCP for assistance in managing diabetes.    Subjective:  Care Team: Primary Care Provider: Wanda Plump, MD ; Next Scheduled Visit: 10/29/2022  Medication Access/Adherence  Current Pharmacy:  [facility] [address] [place] [address] Phone: [phone] Fax: [phone]  [facility] [address] [place], [place] - [address] [place] AT [address] [place]. [place] [address] Phone: [phone] Fax: [phone]   Patient reports affordability concerns with their medications: No  Patient reports access/transportation concerns to their pharmacy: No  Patient reports adherence concerns with their medications:  No      Diabetes:  Current medications: metformin 1000mg  twice a day; pioglitazone 30mg  daily  Current weight = 320lbs Current BMI = 41.13  Current glucose readings: not checking blood glucose at home - has Freestyle Lite glucometer  Patient denies hypoglycemic s/sx including no dizziness, shakiness, sweating. Patient denies hyperglycemic symptoms including no polyuria, polydipsia, polyphagia, nocturia, neuropathy, blurred vision.   Objective:  Lab Results  Component Value Date   HGBA1C 6.2 09/22/2022    Lab Results  Component Value Date   CREATININE 1.12 09/22/2022   BUN 16 09/22/2022   NA 141 09/22/2022   K 3.9 09/22/2022   CL 106 09/22/2022   CO2 25 09/22/2022    Lab Results  Component Value Date   CHOL 103 01/01/2022   HDL 33.30 (L) 01/01/2022   LDLCALC 41 01/01/2022   LDLDIRECT 118.0 09/14/2020   TRIG 147.0 01/01/2022   CHOLHDL 3 01/01/2022    Medications Reviewed Today     Reviewed by Henrene Pastor,  RPH-CPP (Pharmacist) on 10/01/22 at 1101  Med List Status: <None>   Medication Order Taking? Sig Documenting Provider Last Dose Status Informant  ascorbic acid (VITAMIN C) 500 MG tablet [id number] Yes Take 1 tablet (500 mg total) by mouth 2 (two) times daily. Lovorn, Aundra Millet, MD Taking Active   Blood Glucose Monitoring Suppl (FREESTYLE LITE) w/Device KIT [id number] Yes Use to check blood sugar up to 4 times daily as directed Wanda Plump, MD Taking Active   cefadroxil (DURICEF) 500 MG capsule [id number] Yes Take 1 capsule (500 mg total) by mouth 2 (two) times daily. Odette Fraction, MD Taking Active   cholecalciferol (VITAMIN D3) 25 MCG (1000 UNIT) tablet [id number] Yes Take 1,000 Units by mouth daily. [provider] Taking Active   cyclobenzaprine (FLEXERIL) 10 MG tablet [id number] Yes Take 1 tablet (10 mg total) by mouth 3 (three) times daily as needed for muscle spasms. Jacquelynn Cree, PA-C Taking Active   fluticasone (FLONASE) 50 MCG/ACT nasal spray [id number] Yes Place 2 sprays into both nostrils daily. [provider] Taking Active   glucose blood (FREESTYLE LITE) test strip [id number] No Use 1 strip to check blood sugar up to 4 times daily as directed  Patient not taking: Reported on 10/01/2022   Wanda Plump, MD Not Taking Active   HYDROcodone-acetaminophen [facility]) 5-325 MG tablet [id number] Yes Take 1 tablet by mouth every 6 (six) hours as needed (breakthrough pain as needed). Genice Rouge, MD Taking Active            Med Note (  CANTER, Raynaldo Opitz Sep 22, 2022  8:19 AM)    Lancets (FREESTYLE) lancets [id number] No Use 1 lancet to check blood sugar up to 4 times daily as directed  Patient not taking: Reported on 10/01/2022   Wanda Plump, MD Not Taking Active   losartan-hydrochlorothiazide [facility]) 50-12.5 MG tablet [id number] Yes Take 1 tablet by mouth daily. Wanda Plump, MD Taking Active   metFORMIN (GLUCOPHAGE) 1000 MG tablet [id number] Yes Take 1 tablet (1,000 mg total) by mouth  2 (two) times daily with a meal. Wanda Plump, MD Taking Active   morphine (MS CONTIN) 15 MG 12 hr tablet [id number] Yes Take 1 tablet (15 mg total) by mouth every 12 (twelve) hours Lovorn, Aundra Millet, MD Taking Active            Med Note (CANTER, Raynaldo Opitz Sep 22, 2022  8:19 AM)    naloxone [facility] nasal spray 4 mg/0.1 mL [id number] No Use as directed  Patient not taking: Reported on 09/22/2022   Wanda Plump, MD Not Taking Active            Med Note [facility], Paula Libra   Mon Sep 22, 2022  8:20 AM) PRN  pioglitazone (ACTOS) 30 MG tablet [id number] Yes Take 1 tablet (30 mg total) by mouth daily. Wanda Plump, MD Taking Active   polyethylene glycol (MIRALAX / GLYCOLAX) 17 g packet [id number] Yes Take 17 g by mouth 2 (two) times daily. Jacquelynn Cree, PA-C Taking Active   rosuvastatin (CRESTOR) 20 MG tablet [id number] Yes Take 1 tablet (20 mg total) by mouth daily. Lewayne Bunting, MD Taking Active   senna (SENOKOT) 8.6 MG TABS tablet [id number] Yes Take 3 tablets (25.8 mg total) by mouth 2 (two) times daily.  Patient taking differently: Take 1 tablet by mouth 2 (two) times daily.   Jacquelynn Cree, PA-C Taking Active            Med Note (CANTER, Vicente Males D   Fri May 03, 2021  9:31 AM) Prn constipation  simethicone (MYLICON) 80 MG chewable tablet [id number] No Chew 1 tablet (80 mg total) by mouth 4 (four) times daily as needed for flatulence.  Patient not taking: Reported on 10/01/2022   Jacquelynn Cree, PA-C Not Taking Active   tadalafil (CIALIS) 20 MG tablet [id number] Yes Take 0.5-1 tablets (10-20 mg total) by mouth every other day as needed for erectile dysfunction. Wanda Plump, MD Taking Active   tiZANidine (ZANAFLEX) 4 MG tablet [id number] Yes Take 1 tablet (4 mg total) by mouth 2 (two) times daily. Lovorn, Aundra Millet, MD Taking Active   Zinc Sulfate 220 (50 Zn) MG TABS [id number] Yes Take 1 tablet (220 mg total) by mouth daily. Lovorn, Aundra Millet, MD Taking Active               Assessment/Plan:   Diabetes  / obesity class 3 : Last A1c was at goal but patient could benefit from weight loss.  - Reviewed goal A1c, goal fasting, and goal 2 hour post prandial glucose - Recommend trial of GLP1 like agent - either Ozmepic or Mounjaro. Will need prior authorization but should be $25 / month. Will check with Dr Drue Novel about GLP1 preference. If he starts GLP1 recommend that he stop pioglitazone.  - Checked requirements for Dex Com 7 which is preferred by [employer]. It is only covered if patient is using insulin. Patient is considering paying out of pocket  for DexCom 7 but I am not sure that he will get reimbursement for his out of pocket cost.  I am reaching out to someone in HR / benefits to check [facility] [employer] Employee reimbursement policy.     Henrene Pastor, PharmD Clinical Pharmacist [facility] [place]

## 2022-10-02 ENCOUNTER — Other Ambulatory Visit: Payer: Self-pay

## 2022-10-02 ENCOUNTER — Other Ambulatory Visit (HOSPITAL_COMMUNITY): Payer: Self-pay

## 2022-10-02 MED ORDER — TADALAFIL 20 MG PO TABS: 10.0000 mg | ORAL_TABLET | ORAL | 3 refills | Status: AC | PRN

## 2022-10-03 ENCOUNTER — Other Ambulatory Visit (HOSPITAL_COMMUNITY): Payer: Self-pay

## 2022-10-07 ENCOUNTER — Encounter: Payer: Self-pay | Admitting: Infectious Diseases

## 2022-10-07 ENCOUNTER — Ambulatory Visit (INDEPENDENT_AMBULATORY_CARE_PROVIDER_SITE_OTHER): Payer: 59 | Admitting: Infectious Diseases

## 2022-10-07 ENCOUNTER — Other Ambulatory Visit: Payer: Self-pay

## 2022-10-07 VITALS — BP 133/78 | HR 111 | Temp 98.0°F | Ht 74.0 in | Wt 320.0 lb

## 2022-10-07 DIAGNOSIS — Z6841 Body Mass Index (BMI) 40.0 and over, adult: Secondary | ICD-10-CM

## 2022-10-07 DIAGNOSIS — B961 Klebsiella pneumoniae [K. pneumoniae] as the cause of diseases classified elsewhere: Secondary | ICD-10-CM

## 2022-10-07 DIAGNOSIS — G8929 Other chronic pain: Secondary | ICD-10-CM

## 2022-10-07 DIAGNOSIS — M4646 Discitis, unspecified, lumbar region: Secondary | ICD-10-CM | POA: Diagnosis not present

## 2022-10-07 DIAGNOSIS — T847XXD Infection and inflammatory reaction due to other internal orthopedic prosthetic devices, implants and grafts, subsequent encounter: Secondary | ICD-10-CM

## 2022-10-07 DIAGNOSIS — Z5181 Encounter for therapeutic drug level monitoring: Secondary | ICD-10-CM

## 2022-10-07 DIAGNOSIS — M4626 Osteomyelitis of vertebra, lumbar region: Secondary | ICD-10-CM | POA: Diagnosis not present

## 2022-10-07 LAB — CBC
HCT: 42.6 % (ref 38.5–50.0)
Hemoglobin: 14.2 g/dL (ref 13.2–17.1)
MCH: 30.3 pg (ref 27.0–33.0)
MCHC: 33.3 g/dL (ref 32.0–36.0)
MCV: 90.8 fL (ref 80.0–100.0)
MPV: 11.5 fL (ref 7.5–12.5)
Platelets: 232 10*3/uL (ref 140–400)
RBC: 4.69 10*6/uL (ref 4.20–5.80)
RDW: 13.4 % (ref 11.0–15.0)
WBC: 7.7 10*3/uL (ref 3.8–10.8)

## 2022-10-07 NOTE — Progress Notes (Signed)
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   Klebsiella pneumoniae infection 01/28/2021   Wound infection after surgery 01/25/2021   Muscle spasms of both lower extremities 01/25/2021   Abdominal pain 01/25/2021   Spleen enlarged 01/25/2021   Lumbar radiculopathy 01/11/2021   Spasticity 12/03/2020   Neuropathy 09/28/2020   Chronic pain syndrome 09/28/2020   Intervertebral lumbar disc disorder with myelopathy, lumbar region 09/28/2020   Spondylosis, cervical, with myelopathy 09/28/2020   Unilateral primary osteoarthritis, right knee 10/28/2019   PCP NOTES >>>>>>>> 07/09/2015   DJD (degenerative joint disease) 10/27/2014   Screening for heart disease 06/28/2012   OSA (obstructive sleep apnea) 06/14/2012   ED (erectile dysfunction) 04/26/2012   Annual physical exam 12/29/2011   Achilles tendinitis 09/10/2010   Allergic rhinitis 07/12/2010   Hyperlipidemia 04/26/2007   DM II (diabetes mellitus, type II), controlled (HCC) 04/22/2006   OBESITY NOS 04/22/2006   Essential hypertension 04/22/2006   Current Outpatient Medications on File Prior to Visit  Medication Sig Dispense Refill   ascorbic acid (VITAMIN C) 500 MG tablet Take 1 tablet (500 mg total) by mouth 2 (two) times daily. 100 tablet 3   Blood Glucose Monitoring Suppl (FREESTYLE LITE) w/Device KIT Use to check blood sugar up to 4 times daily as directed 1 kit 0   cefadroxil (DURICEF) 500 MG capsule Take 1 capsule (500 mg total) by  mouth 2 (two) times daily. 60 capsule 11   cholecalciferol (VITAMIN D3) 25 MCG (1000 UNIT) tablet Take 1,000 Units by mouth daily.     cyclobenzaprine (FLEXERIL) 10 MG tablet Take 1 tablet (10 mg total) by mouth 3 (three) times daily as needed for muscle spasms. 90 tablet 0   fluticasone (FLONASE) 50 MCG/ACT nasal spray Place 2 sprays into both nostrils daily.     glucose blood (FREESTYLE LITE) test strip Use 1 strip to check blood sugar up to 4 times daily as directed (Patient not taking: Reported on 10/01/2022) 200 each 0   HYDROcodone-acetaminophen (NORCO) 5-325 MG tablet Take 1 tablet by mouth every 6 (six) hours as needed (breakthrough pain as needed). 90 tablet 0   Lancets (FREESTYLE) lancets Use 1 lancet to check blood sugar up to 4 times daily as directed (Patient not taking: Reported on 10/01/2022) 200 each 0   losartan-hydrochlorothiazide (HYZAAR) 50-12.5 MG tablet Take 1 tablet by mouth daily. 30 tablet 1   metFORMIN (GLUCOPHAGE) 1000 MG tablet Take 1 tablet (1,000 mg total) by mouth 2 (two) times daily with a meal. 180 tablet 1   morphine (MS CONTIN) 15 MG 12 hr tablet Take 1 tablet (15 mg total) by mouth every 12 (twelve) hours 60 tablet 0   naloxone (NARCAN) nasal spray 4 mg/0.1 mL Use as directed (Patient not taking: Reported on 09/22/2022) 2 each 1   pioglitazone (ACTOS) 30 MG tablet Take 1 tablet (30 mg total) by mouth daily. 90 tablet 1   polyethylene glycol (MIRALAX / GLYCOLAX) 17 g packet Take  17 g by mouth 2 (two) times daily. 14 each 0   rosuvastatin (CRESTOR) 20 MG tablet Take 1 tablet (20 mg total) by mouth daily. 90 tablet 3   senna (SENOKOT) 8.6 MG TABS tablet Take 3 tablets (25.8 mg total) by mouth 2 (two) times daily. (Patient taking differently: Take 1 tablet by mouth 2 (two) times daily.) 270 tablet 0   simethicone (MYLICON) 80 MG chewable tablet Chew 1 tablet (80 mg total) by mouth 4 (four) times daily as needed for flatulence. (Patient not taking: Reported on 10/01/2022) 60  tablet 0   tadalafil (CIALIS) 20 MG tablet Take 0.5-1 tablets (10-20 mg total) by mouth every other day as needed for erectile dysfunction. 30 tablet 3   tiZANidine (ZANAFLEX) 4 MG tablet Take 1 tablet (4 mg total) by mouth 2 (two) times daily. 60 tablet 5   Zinc Sulfate 220 (50 Zn) MG TABS Take 1 tablet (220 mg total) by mouth daily. 100 tablet 5   No current facility-administered medications on file prior to visit.    Subjective: [name] is a 57 Year old male with PMH of DM, HLD, HTN, Obesity/OSA and DDD s/p recent TLIFwith pedicel screw fixation on 9/30 complicated by Kleb pneumo bacteremia post operatively concerning for surgical site infection following 10/14 debridement of lumbar wound, concerning for deep lumbar wound infection, planned for 6 weeks IV cefazolin, however course complicated by worsening back pain and increased inflammatory markers. MRI was repeated 10/29 osteomyelitis discitis at L1-2 with associated small amount of epidural phlegmon and/or abscess within the left ventral epidural space extending from L1-2 to approximately L2-3/Associated paraspinous edema/phlegmon within the adjacent left greater than right psoas musculature, with superimposed 1 cm left psoas soft tissue abscess.  Neurosurgery did not feel another surgery was indicated and recommended IR consult for drainage of left psoas abscess. IR felt the findings in the left psoas was more of oedema than an abscess and was not drained. ID recommended repeat MRI in 2 weeks. Repeat MRI 11/23 with overall similar findings, left psoas abscess ( 1.2cm , previously 0.9cm), Additional enhancing fluid collection at the anterior left aspect of the L1-L2 disc space measures approximately 1.6 x 3.1 x 3.2 cm likely an additional focus of abscess. Small amount of phlegmon within the left ventral epidural space L2-L3. MRI findings discussed with Dr Daiva Eves and recommended IV abtx for 4 weeks with ID follow up. Patient discharged from the  hospital 03/08/21.   IV cefazolin was switched to IV ceftriaxone 04/11/2021 in the setting of  hardware loosening and erosive changes seen in the recent CT mechanical failure or infection related while on prolonged IV cefazolin course. IV ceftriaxone was continued until 04/30/21 followed by PO suppression with cefadroxil due to + of hardware which he has been consistently taking so far  He has been following closely with pain medicine for pain management as well as neurosurgery.  Lumbar back wound has healed   10/07/22 Taking cefadroxil as instructed. Denies missing doses. Follows Pain clinic for pain management. Also gets steroid shots in the back. Back pain is 5/10 - much improved with steroids, aware it can increase risk of infection. He is also following with PCP for DM/Obesity and Cardiology for CAD, HTN, HLD. He is going to gym three times a week. Wants to let PCP know to have his blood work done as needed for monitoring on abtx.   Review of Systems: All other systems reviewed  with pertinent positives and negatives as  listed above  Past Medical History:  Diagnosis Date   DDD (degenerative disc disease), lumbar    Diabetes mellitus    Hyperlipemia    Hypertension    Obesity    OSA (obstructive sleep apnea)    on CPAP   Past Surgical History:  Procedure Laterality Date   APPLICATION OF ROBOTIC ASSISTANCE FOR SPINAL PROCEDURE N/A 01/11/2021   Procedure: APPLICATION OF ROBOTIC ASSISTANCE FOR SPINAL PROCEDURE;  Surgeon: Jadene Pierini, MD;  Location: [facility] OR;  Service: Neurosurgery;  Laterality: N/A;   APPLICATION OF WOUND VAC N/A 01/25/2021   Procedure: APPLICATION OF WOUND VAC;  Surgeon: Dawley, Alan Mulder, DO;  Location: [facility] OR;  Service: Neurosurgery;  Laterality: N/A;   BACK SURGERY     COLONOSCOPY  2019   LUMBAR WOUND DEBRIDEMENT N/A 01/25/2021   Procedure: Irrigation and Debridement of lumbar wound, and wound vacuum assisted closure;  Surgeon: Dawley, Alan Mulder, DO;  Location: [facility] OR;   Service: Neurosurgery;  Laterality: N/A;   TRANSFORAMINAL LUMBAR INTERBODY FUSION (TLIF) WITH PEDICLE SCREW FIXATION 4 LEVEL N/A 01/11/2021   Procedure: Lumbar one-two, Lumbar two-three, Lumbar three-four, Lumbar four-five, Lumbar five Sacral one Open decompression, Transforaminal lumbar interbody fusion, posterolateral instrumented fusion;  Surgeon: Jadene Pierini, MD;  Location: [facility] OR;  Service: Neurosurgery;  Laterality: N/A;    Social History   Tobacco Use   Smoking status: Never   Smokeless tobacco: Never  Vaping Use   Vaping Use: Never used  Substance Use Topics   Alcohol use: No   Drug use: No    Family History  Problem Relation Age of Onset   Diabetes Mother    Hypertension Neg Hx    Coronary artery disease Neg Hx    Colon cancer Neg Hx    Prostate cancer Neg Hx     Allergies  Allergen Reactions   Penicillins Other (See Comments) and Swelling    REACTION: Blister and Sores in the mouth  Blisters in mouth, REACTION: Blister and Sores in the mouth   Duloxetine Hcl Other (See Comments)    Other Reaction(s): GI Intolerance    Health Maintenance  Topic Date Due   OPHTHALMOLOGY EXAM  06/12/2017   FOOT EXAM  02/09/2021   COVID-19 Vaccine (5 - 2023-24 season) 12/13/2021   INFLUENZA VACCINE  11/13/2022   Diabetic kidney evaluation - Urine ACR  01/02/2023   HEMOGLOBIN A1C  03/24/2023   Diabetic kidney evaluation - eGFR measurement  09/22/2023   Colonoscopy  01/11/2025   DTaP/Tdap/Td (4 - Td or Tdap) 06/02/2032   Hepatitis C Screening  Completed   HIV Screening  Completed   Zoster Vaccines- Shingrix  Completed   HPV VACCINES  Aged Out    Objective: BP 133/78   Pulse (!) 111   Temp 98 F (36.7 C) (Temporal)   Ht 6\' 2"  (1.88 m)   Wt (!) 320 lb (145.2 kg)   SpO2 97%   BMI 41.09 kg/m '   Physical Exam Constitutional:      Appearance: Normal appearance. Morbidly obese  HENT:     Head: Normocephalic and atraumatic.      Mouth: Mucous membranes are  moist.  Eyes:    Conjunctiva/sclera: Conjunctivae normal.     Pupils: Pupils are equal, round,  Cardiovascular:     Rate and Rhythm: Normal rate and regular rhythm.     Heart sounds:   Pulmonary:     Effort: Pulmonary effort is normal.     Breath sounds:  Abdominal:     General:     Palpations: Abdomen is soft.   Musculoskeletal:        General: Normal range of motion.   Skin:    General: Skin is warm and dry.     Comments: Posterior lumbar wound has healed   Neurological:     General: grossly non focal and ambulatory     Mental Status: awake,  alert and oriented to person, place, and time.   Psychiatric:        Mood and Affect: Mood normal.   Lab Results Lab Results  Component Value Date   WBC 6.4 06/16/2022   HGB 14.0 06/16/2022   HCT 41.9 06/16/2022   MCV 88 06/16/2022   PLT 216 06/16/2022    Lab Results  Component Value Date   CREATININE 1.12 09/22/2022   BUN 16 09/22/2022   NA 141 09/22/2022   K 3.9 09/22/2022   CL 106 09/22/2022   CO2 25 09/22/2022    Lab Results  Component Value Date   ALT 15 09/22/2022   AST 12 09/22/2022   ALKPHOS 78 01/01/2022   BILITOT 0.6 01/01/2022    Lab Results  Component Value Date   CHOL 103 01/01/2022   HDL 33.30 (L) 01/01/2022   LDLCALC 41 01/01/2022   LDLDIRECT 118.0 09/14/2020   TRIG 147.0 01/01/2022   CHOLHDL 3 01/01/2022   No results found for: "LABRPR", "RPRTITER" No results found for: "HIV1RNAQUANT", "HIV1RNAVL", "CD4TABS"  Imaging  CT L spine 06/18/2022 IMPRESSION: 1. L1-L5 posterior lumbar fusion with instrumentation again noted. Interval development of fractures of the pedicle screws of S1 bilaterally in keeping with motion along the L5-S1 segment. Superimposed erosive changes, sclerosis, and joint space widening has developed involving the right L5-S1 facet joint in keeping with probable progressive degenerative change in the setting of motion. 2. Interval development of a subacute nonunited left  S1 pedicle fracture. 3. Interval healing with improvement in erosive changes involving the L1-2 intervertebral disc space and adjoining endplates with developing ankylosis of the vertebral bodies. Resolution of previously noted periprosthetic lucency involving the L1 and L2 pedicle screws. 4. Stable right posterolateral displacement of the interbody disc spacer at L1-2 with resultant marked right neuroforaminal narrowing and moderate eccentric mass effect upon the thecal sac. 5. Stable marked bilateral neuroforaminal narrowing at L5-S1. 6. Stable moderate bilateral neuroforaminal narrowing at L4-5. 7. Stable moderate to severe canal stenosis at L5-S1, not optimally visualized on this non myelographic study. 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 S/p prolonged IV cefazolin>> 12/29 switched to IV ceftriaxone  until 04/30/21 followed by PO suppression with cefadroxil due to + of hardware CT 3/6 findings noted, no concerns for infection  # Medication Monitoring   Continue cefadroxil 500mg  PO BID for PO suppression due to presence of hardware Labs today  Will let PCP know about CBC, BMP to be done every 3-4 months as per patient's request   # Chronic back pain  - controlled  - s/p trigger point injections in the left lumbar paraspinal area  - On Flexeril, tizanidine norco and morphine - Follow with Dr Berline Chough  # Morbid Obesity - discussed about healthy diet and exercise  I have personally spent 41  minutes involved in face-to-face and non-face-to-face activities for this patient on the day of the visit  including coordination of care with other specialist and counseling the patient/family.  Victoriano Lain, MD [facility] 10/07/2022, 8:33 AM

## 2022-10-08 ENCOUNTER — Other Ambulatory Visit: Payer: Self-pay | Admitting: Pharmacist

## 2022-10-08 ENCOUNTER — Telehealth: Payer: Self-pay

## 2022-10-08 ENCOUNTER — Other Ambulatory Visit (HOSPITAL_COMMUNITY): Payer: Self-pay

## 2022-10-08 MED ORDER — FREESTYLE LIBRE 3 SENSOR MISC
5 refills | Status: DC
  Filled 2022-10-08: qty 2, 28d supply, fill #0
  Filled 2022-11-09: qty 2, 28d supply, fill #1

## 2022-10-08 NOTE — Telephone Encounter (Signed)
-----   Message from Odette Fraction, MD sent at 10/08/2022 10:10 AM EDT ----- Please let him know CBC is unremarkable

## 2022-10-08 NOTE — Progress Notes (Signed)
10/08/2022 Name: [name] MRN: [id number] DOB: 16-Jul-1965  Chief Complaint  Patient presents with   Medication Management   Diabetes    [name] is a 57 y.o. year old male who presented for a telephone visit.   They were referred to the pharmacist by their PCP for assistance in managing diabetes.    Subjective:  Care Team: Primary Care Provider: Wanda Plump, MD ; Next Scheduled Visit: 10/29/2022  Medication Access/Adherence  Current Pharmacy:  [place] [facility] [address] [place] [address] Phone: [phone] Fax: [phone]  [facility] [address] [place], [place] - [address] [place]. [address] [facility] [address] [place]. [place] [address] Phone: [phone] Fax: [phone]   Patient reports affordability concerns with their medications: Yes  Patient reports access/transportation concerns to their pharmacy: No  Patient reports adherence concerns with their medications:  No      Diabetes:  Current medications: metformin 1000mg  twice a day; pioglitazone 30mg  daily  At last appointment discuss adding Ozempic to regimen. Approved by PCP but in order for patient to get reimbursed for cost with [employer] he will need to enroll in Diabetes Management program thru [employer].   Also checked coverage with [employer] benefits for Dex Com. Patient will not qualify for reembursement for DexCom because he is not currently using insulin to control DM. He can get Libre 3 for $75 with cost reducing coupon. Patient will notify me when received to make appt for sensor education.   Meds ordered this encounter  Medications   Continuous Glucose Sensor (FREESTYLE LIBRE 3 SENSOR) MISC    Sig: Place 1 sensor on the skin every 14 days. Use to check glucose continuously    Dispense:  2 each    Refill:  5    Please use [employer] discount card to lower cost to $75/month.  Patient is aware sensor not covered and will not be reimbursed.    Current weight = 320lbs Current BMI = 41.13  Current glucose readings: not checking blood glucose at home - has Freestyle Lite glucometer  Patient denies hypoglycemic s/sx including no dizziness, shakiness, sweating. Patient denies hyperglycemic symptoms including no polyuria, polydipsia, polyphagia, nocturia, neuropathy, blurred vision.   Objective:  Lab Results  Component Value Date   HGBA1C 6.2 09/22/2022    Lab Results  Component Value Date   CREATININE 1.12 09/22/2022   BUN 16 09/22/2022   NA 141 09/22/2022   K 3.9 09/22/2022   CL 106 09/22/2022   CO2 25 09/22/2022    Lab Results  Component Value Date   CHOL 103 01/01/2022   HDL 33.30 (L) 01/01/2022   LDLCALC 41 01/01/2022   LDLDIRECT 118.0 09/14/2020   TRIG 147.0 01/01/2022   CHOLHDL 3 01/01/2022    Medications Reviewed Today     Reviewed by Henrene Pastor, RPH-CPP (Pharmacist) on 10/08/22 at 1241  Med List Status: <None>   Medication Order Taking? Sig Documenting Provider Last Dose Status Informant  ascorbic acid (VITAMIN C) 500 MG tablet [id number] No Take 1 tablet (500 mg total) by mouth 2 (two) times daily. Lovorn, Aundra Millet, MD Taking Active   Blood Glucose Monitoring Suppl (FREESTYLE LITE) w/Device KIT [id number] No Use to check blood sugar up to 4 times daily as directed Wanda Plump, MD Taking Active   cefadroxil (DURICEF) 500 MG capsule [id number] No Take 1 capsule (500 mg  total) by mouth 2 (two) times daily. Odette Fraction, MD Taking Active   cholecalciferol (VITAMIN D3) 25 MCG (1000 UNIT) tablet [id number] No Take 1,000 Units by mouth daily. [provider] Taking Active   cyclobenzaprine (FLEXERIL) 10 MG tablet [id number] No Take 1 tablet (10 mg total) by mouth 3 (three) times daily as needed for muscle spasms. Jacquelynn Cree, PA-C Taking Active   fluticasone (FLONASE) 50 MCG/ACT nasal spray [id number] No Place 2 sprays into both  nostrils daily. [provider] Taking Active   glucose blood (FREESTYLE LITE) test strip [id number] No Use 1 strip to check blood sugar up to 4 times daily as directed  Patient not taking: Reported on 10/01/2022   Wanda Plump, MD Not Taking Active   HYDROcodone-acetaminophen [facility]) 5-325 MG tablet [id number] No Take 1 tablet by mouth every 6 (six) hours as needed (breakthrough pain as needed). Genice Rouge, MD Taking Active            Med Note [place], Raynaldo Opitz Sep 22, 2022  8:19 AM)    Lancets (FREESTYLE) lancets [id number] No Use 1 lancet to check blood sugar up to 4 times daily as directed  Patient not taking: Reported on 10/01/2022   Wanda Plump, MD Not Taking Active   losartan-hydrochlorothiazide [facility]) 50-12.5 MG tablet [id number] No Take 1 tablet by mouth daily. Wanda Plump, MD Taking Active   metFORMIN (GLUCOPHAGE) 1000 MG tablet [id number] No Take 1 tablet (1,000 mg total) by mouth 2 (two) times daily with a meal. Wanda Plump, MD Taking Active   morphine (MS CONTIN) 15 MG 12 hr tablet [id number] No Take 1 tablet (15 mg total) by mouth every 12 (twelve) hours Lovorn, Aundra Millet, MD Taking Active            Med Note (CANTER, Raynaldo Opitz Sep 22, 2022  8:19 AM)    naloxone [facility]) nasal spray 4 mg/0.1 mL [id number] No Use as directed  Patient not taking: Reported on 10/07/2022   Wanda Plump, MD Not Taking Active            Med Note [facility], Paula Libra   Mon Sep 22, 2022  8:20 AM) PRN  pioglitazone (ACTOS) 30 MG tablet [id number] No Take 1 tablet (30 mg total) by mouth daily. Wanda Plump, MD Taking Active   polyethylene glycol (MIRALAX / GLYCOLAX) 17 g packet [id number] No Take 17 g by mouth 2 (two) times daily. Jacquelynn Cree, PA-C Taking Active   rosuvastatin (CRESTOR) 20 MG tablet [id number] No Take 1 tablet (20 mg total) by mouth daily. Lewayne Bunting, MD Taking Active   senna (SENOKOT) 8.6 MG TABS tablet [id number] No Take 3 tablets (25.8 mg total) by mouth 2 (two) times daily.   Patient taking differently: Take 1 tablet by mouth 2 (two) times daily.   Jacquelynn Cree, PA-C Taking Active            Med Note (CANTER, Vicente Males D   Fri May 03, 2021  9:31 AM) Prn constipation  simethicone (MYLICON) 80 MG chewable tablet [id number] No Chew 1 tablet (80 mg total) by mouth 4 (four) times daily as needed for flatulence. Jacquelynn Cree, PA-C Taking Active   tadalafil (CIALIS) 20 MG tablet [id number] No Take 0.5-1 tablets (10-20 mg total) by mouth every other day as needed for erectile dysfunction. Wanda Plump, MD Taking Active   tiZANidine (ZANAFLEX) 4 MG tablet [id number] No  Take 1 tablet (4 mg total) by mouth 2 (two) times daily. Lovorn, Aundra Millet, MD Taking Active   Zinc Sulfate 220 (50 Zn) MG TABS [id number] No Take 1 tablet (220 mg total) by mouth daily. Lovorn, Aundra Millet, MD Taking Active               Assessment/Plan:   Diabetes / obesity class 3 : Last A1c was at goal but patient could benefit from weight loss.  - Reviewed goal A1c, goal fasting, and goal 2 hour post prandial glucose.  - Sent in Rx for [facility] 3 - patient is aware that cost will be $75 and that he will not receive reimbursement because DexCom is preferred and not covered unless he is using insulin.  - provided number to patient for [employer] [phone]. Instructed to scheduled appt. After he has had first appt he should be able to get Ozempic for $25 with coupon card and will be reimbursed.     Henrene Pastor, PharmD Clinical Pharmacist [facility]

## 2022-10-10 ENCOUNTER — Other Ambulatory Visit (HOSPITAL_COMMUNITY): Payer: Self-pay

## 2022-10-10 ENCOUNTER — Other Ambulatory Visit: Payer: Self-pay | Admitting: Physical Medicine and Rehabilitation

## 2022-10-10 MED ORDER — MORPHINE SULFATE ER 15 MG PO TBCR
15.0000 mg | EXTENDED_RELEASE_TABLET | Freq: Two times a day (BID) | ORAL | 0 refills | Status: DC
  Filled 2022-10-10 – 2022-10-13 (×2): qty 20, 10d supply, fill #0
  Filled 2022-10-13: qty 40, 20d supply, fill #0

## 2022-10-13 ENCOUNTER — Other Ambulatory Visit (HOSPITAL_COMMUNITY): Payer: Self-pay

## 2022-10-14 ENCOUNTER — Other Ambulatory Visit (HOSPITAL_COMMUNITY): Payer: Self-pay

## 2022-10-17 ENCOUNTER — Other Ambulatory Visit (HOSPITAL_COMMUNITY): Payer: Self-pay

## 2022-10-20 ENCOUNTER — Ambulatory Visit: Payer: 59 | Admitting: Pharmacist

## 2022-10-20 DIAGNOSIS — I10 Essential (primary) hypertension: Secondary | ICD-10-CM

## 2022-10-20 DIAGNOSIS — Z79899 Other long term (current) drug therapy: Secondary | ICD-10-CM

## 2022-10-20 DIAGNOSIS — E118 Type 2 diabetes mellitus with unspecified complications: Secondary | ICD-10-CM

## 2022-10-20 NOTE — Progress Notes (Signed)
10/20/2022 Name: [name] DOB: [birth year]  Chief Complaint  Patient presents with   Diabetes    FOLLOW UP    [name] is a 57 y.o. year old male who presented for a telephone visit.   They were referred to the pharmacist by their PCP for assistance in managing diabetes.    Subjective:  Care Team: Primary Care Provider: Wanda Plump, MD ; Next Scheduled Visit: 10/29/2022  Medication Access/Adherence  Current Pharmacy:   - [facility] [address] [place] [address] Phone: [phone] Fax: [phone]  [facility] [address] [place], [place] [address] [place]. AT [facility] [address] [place]. [place] [address] Phone: [phone] Fax: [phone]   Patient reports affordability concerns with their medications: Yes  Patient reports access/transportation concerns to their pharmacy: No  Patient reports adherence concerns with their medications:  No      Diabetes:  Current medications: metformin 1000mg  twice a day; pioglitazone 30mg  daily  At last appointment discuss adding Ozempic to regimen. Approved by PCP but in order for patient to get reimbursed for cost with [employer] he will need to enroll in Diabetes Management program thru [employer]. Patient has had 1 visit with [employer] team - on 10/09/2022  Dex Com was not covered by his insurance plan because he is not currently using insulin to control DM. He did purchase 2 sensors of Libre 3 for $75 with cost reducing coupon. Patient has not started using Libre 3 yet. He is waiting until his wife who is  a nurse to have a few days off before he starts.   Current weight = 320lbs Current BMI = 41.13  Current glucose readings: not checking blood glucose at home - has Freestyle Lite glucometer  Patient denies hypoglycemic s/sx including no dizziness, shakiness, sweating.  Patient denies hyperglycemic symptoms including no polyuria, polydipsia, polyphagia, nocturia, neuropathy, blurred vision.  Hypertension:  Current Medication: losartan hydrochlorothiazide 50/12.5mg  (dose was lowered recently from 100/25mg  - patient was taking 0.5 tablet for higher dose for awhile)  Medication Adherence:  Should be due to refill losartan hydrochlorothiazide 50/12.5mg  soon and past due to refill pioglitazone 30mg  - LR was 06/06/2022 for 90 days but patient reports he has plenty of pioglitazone on hand.    Objective:  Lab Results  Component Value Date   HGBA1C 6.2 09/22/2022    Lab Results  Component Value Date   CREATININE 1.12 09/22/2022   BUN 16 09/22/2022   NA 141 09/22/2022   K 3.9 09/22/2022   CL 106 09/22/2022   CO2 25 09/22/2022    Lab Results  Component Value Date   CHOL 103 01/01/2022   HDL 33.30 (L) 01/01/2022   LDLCALC 41 01/01/2022   LDLDIRECT 118.0 09/14/2020   TRIG 147.0 01/01/2022   CHOLHDL 3 01/01/2022    Medications Reviewed Today     Reviewed by Henrene Pastor, RPH-CPP (Pharmacist) on 10/20/22 at 1055  Med List Status: <None>   Medication Order Taking? Sig Documenting Provider Last Dose Status Informant  ascorbic acid (VITAMIN C) 500 MG tablet [id number]  Take 1 tablet (500 mg total) by mouth 2 (two) times daily. Lovorn, Megan, MD  Active   Blood Glucose Monitoring Suppl (FREESTYLE LITE) w/Device KIT [id number]  Use to check blood sugar up to 4 times daily as directed Wanda Plump, MD  Active  cefadroxil (DURICEF) 500 MG capsule [id number]  Take 1 capsule (500 mg total) by mouth 2 (two) times daily. Odette Fraction, MD  Active   cholecalciferol (VITAMIN D3) 25 MCG (1000 UNIT) tablet [id number]  Take 1,000 Units by mouth daily. [provider]  Active   Continuous Glucose Sensor (FREESTYLE LIBRE 3 SENSOR) [place] [id number]  Place 1 sensor on the skin every 14 days. Use to check glucose continuously Wanda Plump, MD  Active    cyclobenzaprine (FLEXERIL) 10 MG tablet [id number]  Take 1 tablet (10 mg total) by mouth 3 (three) times daily as needed for muscle spasms. Love, Pamela S, PA-C  Active   fluticasone (FLONASE) 50 MCG/ACT nasal spray [id number]  Place 2 sprays into both nostrils daily. [provider]  Active   glucose blood (FREESTYLE LITE) test strip [id number]  Use 1 strip to check blood sugar up to 4 times daily as directed  Patient not taking: Reported on 10/01/2022   Wanda Plump, MD  Active   HYDROcodone-acetaminophen [facility]) 5-325 MG tablet [id number]  Take 1 tablet by mouth every 6 (six) hours as needed (breakthrough pain as needed). Lovorn, Aundra Millet, MD  Active            Med Note [place], Raynaldo Opitz Sep 22, 2022  8:19 AM)    Lancets (FREESTYLE) lancets [id number]  Use 1 lancet to check blood sugar up to 4 times daily as directed  Patient not taking: Reported on 10/01/2022   Wanda Plump, MD  Active   losartan-hydrochlorothiazide [facility])) 50-12.5 MG tablet [id number]  Take 1 tablet by mouth daily. Wanda Plump, MD  Active   metFORMIN (GLUCOPHAGE) 1000 MG tablet [id number]  Take 1 tablet (1,000 mg total) by mouth 2 (two) times daily with a meal. Wanda Plump, MD  Active   morphine (MS CONTIN) 15 MG 12 hr tablet [id number]  Take 1 tablet (15 mg total) by mouth every 12 (twelve) hours Lovorn, Aundra Millet, MD  Active   naloxone [facility]) nasal spray 4 mg/0.1 mL [id number]  Use as directed  Patient not taking: Reported on 10/07/2022   Wanda Plump, MD  Active            Med Note [facility], [facility] D   Mon Sep 22, 2022  8:20 AM) PRN  pioglitazone (ACTOS) 30 MG tablet [id number]  Take 1 tablet (30 mg total) by mouth daily. Paz, Nolon Rod, MD  Active   polyethylene glycol (MIRALAX / GLYCOLAX) 17 g packet [id number]  Take 17 g by mouth 2 (two) times daily. Jacquelynn Cree, PA-C  Active   rosuvastatin (CRESTOR) 20 MG tablet [id number]  Take 1 tablet (20 mg total) by mouth daily. Lewayne Bunting, MD  Active   senna (SENOKOT) 8.6 MG  TABS tablet [id number]  Take 3 tablets (25.8 mg total) by mouth 2 (two) times daily.  Patient taking differently: Take 1 tablet by mouth 2 (two) times daily.   Jacquelynn Cree, PA-C  Active            Med Note (CANTER, Vicente Males D   Fri May 03, 2021  9:31 AM) Prn constipation  simethicone (MYLICON) 80 MG chewable tablet [id number]  Chew 1 tablet (80 mg total) by mouth 4 (four) times daily as needed for flatulence. Jacquelynn Cree, PA-C  Active   tadalafil (CIALIS) 20 MG tablet [id number]  Take 0.5-1 tablets (10-20 mg total) by mouth every other day as needed for erectile dysfunction.  Wanda Plump, MD  Active   tiZANidine (ZANAFLEX) 4 MG tablet Murphy  Take 1 tablet (4 mg total) by mouth 2 (two) times daily. Lovorn, Aundra Millet, MD  Active   Zinc Sulfate 220 (50 Zn) MG TABS [id number]  Take 1 tablet (220 mg total) by mouth daily. Lovorn, Aundra Millet, MD  Active               Assessment/Plan:   Diabetes / obesity class 3 : Last A1c was at goal but patient could benefit from weight loss.  - Reviewed goal A1c, goal fasting, and goal 2 hour post prandial glucose.  - Discussed starting Libre 3 Continuous Glucose Monitor - offered to talk patient thru start sensor over the phone but he declined. He is considering coming into office for instruction but he will discuss with his wife first. She is a Engineer, civil (consulting) and she might be able to start sensor for him.  - Continue to follow up with [employer] [phone]. Will check to see when he will qualify for coapy reimbursement and then plan to start Ozempic. Should be able to get Ozempic for $25 with coupon card and will be reimbursed if in Diabetes Management program.   Hypertension:  Continue - losartan hydrochlorothiazide 50/12.5mg  (dose was lowered recently from 100/25mg  - patient was taking 0.5 tablet of higher dose for awhile)  Medication Adherence:  Discussed adherence and reviewed med list. Updated med list.  Reminded that pioglitazone should be due  soon.   Henrene Pastor, PharmD Clinical Pharmacist [facility]

## 2022-10-24 ENCOUNTER — Other Ambulatory Visit: Payer: Self-pay | Admitting: Infectious Diseases

## 2022-10-24 DIAGNOSIS — M5416 Radiculopathy, lumbar region: Secondary | ICD-10-CM | POA: Diagnosis not present

## 2022-10-24 DIAGNOSIS — T847XXD Infection and inflammatory reaction due to other internal orthopedic prosthetic devices, implants and grafts, subsequent encounter: Secondary | ICD-10-CM

## 2022-10-27 ENCOUNTER — Encounter: Payer: 59 | Admitting: Physical Medicine and Rehabilitation

## 2022-10-27 ENCOUNTER — Telehealth: Payer: Self-pay | Admitting: Pharmacist

## 2022-10-27 ENCOUNTER — Other Ambulatory Visit (HOSPITAL_COMMUNITY): Payer: Self-pay

## 2022-10-27 MED ORDER — CEFADROXIL 500 MG PO CAPS
500.0000 mg | ORAL_CAPSULE | Freq: Two times a day (BID) | ORAL | 3 refills | Status: DC
  Filled 2022-10-27: qty 60, 30d supply, fill #0
  Filled 2022-11-23: qty 60, 30d supply, fill #1
  Filled 2022-12-22: qty 60, 30d supply, fill #2
  Filled 2023-01-26: qty 60, 30d supply, fill #3

## 2022-10-27 NOTE — Telephone Encounter (Signed)
Called patient to see if he had started Continuous Glucose Monitor. He states he has not started yet. He will be in office Wed to see Dr Drue Novel and I will show him how to use the Continuous Glucose Monitor then and place first sensor.

## 2022-10-29 ENCOUNTER — Encounter: Payer: Self-pay | Admitting: Internal Medicine

## 2022-10-29 ENCOUNTER — Other Ambulatory Visit (HOSPITAL_COMMUNITY): Payer: Self-pay

## 2022-10-29 ENCOUNTER — Ambulatory Visit (INDEPENDENT_AMBULATORY_CARE_PROVIDER_SITE_OTHER): Payer: 59 | Admitting: Pharmacist

## 2022-10-29 ENCOUNTER — Ambulatory Visit: Payer: 59 | Admitting: Internal Medicine

## 2022-10-29 VITALS — BP 138/80 | HR 88 | Temp 98.4°F | Resp 18 | Ht 74.0 in | Wt 321.0 lb

## 2022-10-29 DIAGNOSIS — E118 Type 2 diabetes mellitus with unspecified complications: Secondary | ICD-10-CM | POA: Diagnosis not present

## 2022-10-29 DIAGNOSIS — Z7984 Long term (current) use of oral hypoglycemic drugs: Secondary | ICD-10-CM

## 2022-10-29 DIAGNOSIS — Z6841 Body Mass Index (BMI) 40.0 and over, adult: Secondary | ICD-10-CM

## 2022-10-29 DIAGNOSIS — E7800 Pure hypercholesterolemia, unspecified: Secondary | ICD-10-CM

## 2022-10-29 DIAGNOSIS — I10 Essential (primary) hypertension: Secondary | ICD-10-CM

## 2022-10-29 LAB — LIPID PANEL
Cholesterol: 85 mg/dL (ref 0–200)
HDL: 37.4 mg/dL — ABNORMAL LOW (ref >39.00)
LDL Cholesterol: 25 mg/dL (ref 0–99)
NonHDL: 47.77
Total CHOL/HDL Ratio: 2
Triglycerides: 114 mg/dL (ref 0.0–149.0)
VLDL: 22.8 mg/dL (ref 0.0–40.0)

## 2022-10-29 LAB — ALT: ALT: 13 U/L (ref 0–53)

## 2022-10-29 LAB — AST: AST: 12 U/L (ref 0–37)

## 2022-10-29 MED ORDER — TIRZEPATIDE 2.5 MG/0.5ML ~~LOC~~ SOAJ
2.5000 mg | SUBCUTANEOUS | 0 refills | Status: DC
Start: 2022-10-29 — End: 2022-11-24
  Filled 2022-10-29 – 2022-11-03 (×4): qty 2, 28d supply, fill #0

## 2022-10-29 NOTE — Progress Notes (Signed)
10/29/2022 Name: [name] MRN: [id number] DOB: 1965-09-11  Chief Complaint  Patient presents with   Medication Management   Diabetes    [name] is a 57 y.o. year old male who presented for an office visit for Continuous Glucose Monitor education and diabetes management.    They were referred to the pharmacist by their PCP for assistance in managing diabetes.    Subjective:  Care Team: Primary Care Provider: Wanda Plump, MD ; Next Scheduled Visit: 3 months  Medication Access/Adherence  Current Pharmacy:  [place] [facility] [address] [place] [address] Phone: [phone] Fax: [phone]  [facility] [address] [place], [place] [address] [place]. AT [facility] [address] [place]. [place] [address] Phone: [phone] Fax: [phone]   Patient reports affordability concerns with their medications: Yes  Patient reports access/transportation concerns to their pharmacy: No  Patient reports adherence concerns with their medications:  No      Diabetes:  Current medications: metformin 1000mg  twice a day; pioglitazone 30mg  daily  At last appointment discuss adding GLP1 to regimen. Approved by PCP but in order for patient to get reimbursed for cost with [facility] Employee insurance he needed to enroll in Diabetes Management program thru [employer]. Verified that patient has enrolled in [employer] program.   Dex Com was not covered by his insurance plan because he is not currently using insulin to control DM. He did purchase 2 sensors of Libre 3 for $75 with cost reducing coupon.  Education in [place] 3 Continuous Glucose Monitor was provided in office today.    Wt Readings from Last 3 Encounters:  10/29/22 (!) 321 lb (145.6 kg)  10/07/22 (!) 320 lb (145.2 kg)  09/22/22 (!) 320 lb 6 oz (145.3 kg)    Current glucose readings: not checking blood glucose at  home - has Freestyle Lite glucometer .  Hypertension:  Current Medication: losartan hydrochlorothiazide 50/12.5mg  (dose was lowered recently from 100/25mg  - patient was taking 0.5 tablet for higher dose for awhile)  BP Readings from Last 3 Encounters:  10/29/22 138/80  10/07/22 133/78  09/22/22 136/80     Medication Adherence:  Refilled losartan hydrochlorothiazide 10/02/2022 for 30 days.  Past due to refill pioglitazone - LR was 06/06/2022 for 90 days.    Objective:  Lab Results  Component Value Date   HGBA1C 6.2 09/22/2022    Lab Results  Component Value Date   CREATININE 1.12 09/22/2022   BUN 16 09/22/2022   NA 141 09/22/2022   K 3.9 09/22/2022   CL 106 09/22/2022   CO2 25 09/22/2022    Lab Results  Component Value Date   CHOL 103 01/01/2022   HDL 33.30 (L) 01/01/2022   LDLCALC 41 01/01/2022   LDLDIRECT 118.0 09/14/2020   TRIG 147.0 01/01/2022   CHOLHDL 3 01/01/2022    Medications Reviewed Today     Reviewed by Wanda Plump, MD (Physician) on 10/29/22 at 0825  Med List Status: <None>   Medication Order Taking? Sig Documenting Provider Last Dose Status Informant  ascorbic acid (VITAMIN C) 500 MG tablet [id number] Yes Take 1 tablet (500 mg total) by mouth 2 (two) times daily. Lovorn, Aundra Millet, MD Taking Active   Blood Glucose Monitoring Suppl (FREESTYLE LITE) w/Device KIT [id number] Yes Use to check blood sugar up to 4 times daily as directed Wanda Plump, MD Taking Active  cefadroxil (DURICEF) 500 MG capsule [id number] Yes Take 1 capsule (500 mg total) by mouth 2 (two) times daily. Odette Fraction, MD Taking Active   cholecalciferol (VITAMIN D3) 25 MCG (1000 UNIT) tablet [id number] Yes Take 1,000 Units by mouth daily. [provider] Taking Active   Continuous Glucose Sensor (FREESTYLE LIBRE 3 SENSOR) [place] [id number] Yes Place 1 sensor on the skin every 14 days. Use to check glucose continuously Wanda Plump, MD Taking Active   fluticasone [facility]) 50  MCG/ACT nasal spray [id number] Yes Place 2 sprays into both nostrils daily. [provider] Taking Active   glucose blood (FREESTYLE LITE) test strip [id number] Yes Use 1 strip to check blood sugar up to 4 times daily as directed Wanda Plump, MD Taking Active   HYDROcodone-acetaminophen (NORCO) 5-325 MG tablet [id number] Yes Take 1 tablet by mouth every 6 (six) hours as needed (breakthrough pain as needed). Genice Rouge, MD Taking Active            Med Note [place], Raynaldo Opitz Sep 22, 2022  8:19 AM)    Lancets (FREESTYLE) lancets [id number] Yes Use 1 lancet to check blood sugar up to 4 times daily as directed Wanda Plump, MD Taking Active   losartan-hydrochlorothiazide [facility]) 50-12.5 MG tablet [id number] Yes Take 1 tablet by mouth daily. Wanda Plump, MD Taking Active   metFORMIN (GLUCOPHAGE) 1000 MG tablet [id number] Yes Take 1 tablet (1,000 mg total) by mouth 2 (two) times daily with a meal. Wanda Plump, MD Taking Active   morphine (MS CONTIN) 15 MG 12 hr tablet [id number] Yes Take 1 tablet (15 mg total) by mouth every 12 (twelve) hours Lovorn, Aundra Millet, MD Taking Active   naloxone [facility]) nasal spray 4 mg/0.1 mL [id number] Yes Use as directed Wanda Plump, MD Taking Active            Med Note (CANTER, Vicente Males D   Mon Sep 22, 2022  8:20 AM) PRN  pioglitazone (ACTOS) 30 MG tablet [id number] Yes Take 1 tablet (30 mg total) by mouth daily. Wanda Plump, MD Taking Active   polyethylene glycol (MIRALAX / GLYCOLAX) 17 g packet [id number] Yes Take 17 g by mouth 2 (two) times daily. Jacquelynn Cree, PA-C Taking Active   rosuvastatin (CRESTOR) 20 MG tablet [id number] Yes Take 1 tablet (20 mg total) by mouth daily. Lewayne Bunting, MD Taking Active   senna (SENOKOT) 8.6 MG TABS tablet [id number] No Take 3 tablets (25.8 mg total) by mouth 2 (two) times daily.  Patient not taking: Reported on 10/29/2022   Jerene Pitch Not Taking Active            Med Note (CANTER, Vicente Males D   Fri May 03, 2021  9:31 AM)  Prn constipation  simethicone (MYLICON) 80 MG chewable tablet [id number] Yes Chew 1 tablet (80 mg total) by mouth 4 (four) times daily as needed for flatulence. Jacquelynn Cree, PA-C Taking Active   tadalafil (CIALIS) 20 MG tablet [id number] Yes Take 0.5-1 tablets (10-20 mg total) by mouth every other day as needed for erectile dysfunction. Wanda Plump, MD Taking Active   tiZANidine (ZANAFLEX) 4 MG tablet [id number] Yes Take 1 tablet (4 mg total) by mouth 2 (two) times daily. Lovorn, Aundra Millet, MD Taking Active   Zinc Sulfate 220 (50 Zn) MG TABS [id number] Yes Take 1 tablet (220 mg total) by mouth daily. Genice Rouge, MD Taking Active  Assessment/Plan:   Diabetes / obesity class 3 : Last A1c was at goal but patient could benefit from weight loss.  - Reviewed goal A1c, goal fasting, and goal 2 hour post prandial glucose.  - Start Mounjaro 2.5mg  weekly for 4 weeks, then increase to 5mg  weekly. - Stop pioglitazone, continue metformin 1000mg  twice a day.  - Patient received the following instruction for Freestyle Libre 3 Personal CGM:  preparation of placement site - clean with alcohol and allow to dry.  Sensor is to only be place on back of upper arm.  Patient to rotate sides and site.  care of sensor and site  reminded that sensor is waterproof up to 3 feet and for 30 minutes.  reviewed Libre 3 app home screen and how to read and respond to trend arrows.  reminded that when magnifying glass symbols shows up she is to confirm BG with finger stick before making any treatment decisions. .  pt signed up for Why view in office. Linked with PCP office.   - Continue to follow up with [employer] .   Meds ordered this encounter  Medications   tirzepatide (MOUNJARO) 2.5 MG/0.5ML Pen    Sig: Inject 2.5 mg into the skin once a week.    Dispense:  2 mL    Refill:  0    Hypertension:  Continue - losartan hydrochlorothiazide 50/12.5mg  daily  Medication Adherence:   Discussed adherence and reviewed med list. Updated med list.   Henrene Pastor, PharmD Clinical Pharmacist [facility]

## 2022-10-29 NOTE — Patient Instructions (Addendum)
   GO TO THE LAB : Get the blood work     GO TO THE FRONT DESK, PLEASE SCHEDULE YOUR APPOINTMENTS Come back for a checkup in 2 months    Vaccines I recommend:  Covid booster Flu shot this fall   Per our records you are due for your diabetic eye exam. Please contact your eye doctor to schedule an appointment. Please have them send copies of your office visit notes to [name]. Our fax number is [phone]. If you need a referral to an eye doctor please let us know.

## 2022-10-29 NOTE — Progress Notes (Signed)
Subjective:    Patient ID: [name], male    DOB: 08-01-1965, 57 y.o.   MRN: [id number]  DOS:  10/29/2022 Type of visit - description: Routine checkup  Recently saw ID, note reviewed. Denies fever or chills.  Ambulatory CBGs in the 130s. Good med compliance Denies lower extremity paresthesias  Wt Readings from Last 3 Encounters:  10/29/22 (!) 321 lb (145.6 kg)  10/07/22 (!) 320 lb (145.2 kg)  09/22/22 (!) 320 lb 6 oz (145.3 kg)    Review of Systems See above   Past Medical History:  Diagnosis Date   DDD (degenerative disc disease), lumbar    Diabetes mellitus    Hyperlipemia    Hypertension    Obesity    OSA (obstructive sleep apnea)    on CPAP    Past Surgical History:  Procedure Laterality Date   APPLICATION OF ROBOTIC ASSISTANCE FOR SPINAL PROCEDURE N/A 01/11/2021   Procedure: APPLICATION OF ROBOTIC ASSISTANCE FOR SPINAL PROCEDURE;  Surgeon: Jadene Pierini, MD;  Location: [facility] OR;  Service: Neurosurgery;  Laterality: N/A;   APPLICATION OF WOUND VAC N/A 01/25/2021   Procedure: APPLICATION OF WOUND VAC;  Surgeon: Dawley, Alan Mulder, DO;  Location: [facility] OR;  Service: Neurosurgery;  Laterality: N/A;   BACK SURGERY     COLONOSCOPY  2019   LUMBAR WOUND DEBRIDEMENT N/A 01/25/2021   Procedure: Irrigation and Debridement of lumbar wound, and wound vacuum assisted closure;  Surgeon: Dawley, Alan Mulder, DO;  Location: [facility] OR;  Service: Neurosurgery;  Laterality: N/A;   TRANSFORAMINAL LUMBAR INTERBODY FUSION (TLIF) WITH PEDICLE SCREW FIXATION 4 LEVEL N/A 01/11/2021   Procedure: Lumbar one-two, Lumbar two-three, Lumbar three-four, Lumbar four-five, Lumbar five Sacral one Open decompression, Transforaminal lumbar interbody fusion, posterolateral instrumented fusion;  Surgeon: Jadene Pierini, MD;  Location: [facility] OR;  Service: Neurosurgery;  Laterality: N/A;    Current Outpatient Medications  Medication Instructions   ascorbic acid (VITAMIN C) 500 mg, Oral, 2 times daily   Blood  Glucose Monitoring Suppl (FREESTYLE LITE) w/Device KIT Use to check blood sugar up to 4 times daily as directed   cefadroxil (DURICEF) 500 mg, Oral, 2 times daily   cholecalciferol (VITAMIN D3) 1,000 Units, Oral, Daily   Continuous Glucose Sensor (FREESTYLE LIBRE 3 SENSOR) MISC Place 1 sensor on the skin every 14 days. Use to check glucose continuously   fluticasone (FLONASE) 50 MCG/ACT nasal spray 2 sprays, Each Nare, Daily   glucose blood (FREESTYLE LITE) test strip Use 1 strip to check blood sugar up to 4 times daily as directed   HYDROcodone-acetaminophen (NORCO) 5-325 MG tablet 1 tablet, Oral, Every 6 hours PRN   Lancets (FREESTYLE) lancets Use 1 lancet to check blood sugar up to 4 times daily as directed   losartan-hydrochlorothiazide (HYZAAR) 50-12.5 MG tablet 1 tablet, Oral, Daily   metFORMIN (GLUCOPHAGE) 1,000 mg, Oral, 2 times daily with meals   morphine (MS CONTIN) 15 MG 12 hr tablet Take 1 tablet (15 mg total) by mouth every 12 (twelve) hours   naloxone (NARCAN) nasal spray 4 mg/0.1 mL Use as directed   polyethylene glycol (MIRALAX / GLYCOLAX) 17 g, Oral, 2 times daily   rosuvastatin (CRESTOR) 20 mg, Oral, Daily   senna (SENOKOT) 25.8 mg, Oral, 2 times daily   simethicone (MYLICON) 80 mg, Oral, 4 times daily PRN   tadalafil (CIALIS) 10-20 mg, Oral, Every 48 hours PRN   tirzepatide (MOUNJARO) 2.5 mg, Subcutaneous, Weekly   tiZANidine (ZANAFLEX) 4 mg, Oral, 2  times daily   Zinc Sulfate 220 mg, Oral, Daily       Objective:   Physical Exam BP 138/80   Pulse 88   Temp 98.4 F (36.9 C) (Oral)   Resp 18   Ht 6\' 2"  (1.88 m)   Wt (!) 321 lb (145.6 kg)   SpO2 95%   BMI 41.21 kg/m  General:   Well developed, NAD, BMI noted. HEENT:  Normocephalic . Face symmetric, atraumatic Lungs:  CTA B Normal respiratory effort, no intercostal retractions, no accessory muscle use. Heart: RRR,  no murmur.  DM foot exam: No edema, good pedal pulses, pinprick examination normal Skin: Not  pale. Not jaundice Neurologic:  alert & oriented X3.  Speech normal, gait appropriate for age and unassisted Psych--  Cognition and judgment appear intact.  Cooperative with normal attention span and concentration.  Behavior appropriate. No anxious or depressed appearing.      Assessment    Assessment DM NO neuropathy per NCS 07-2020.  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  Continue metformin, in coordination with our clinical pharmacist, will stop pioglitazone and start a GLP-1 for morbid obesity.. Feet exam negative Morbid obesity: Not making much progress, start GLP-1  High cholesterol: Continue Crestor, check a FLP AST ALT Discitis, lumbar osteomyelitis  Saw ID last month, they rec to continue oral antibiotics for infection suppression.  They requested a CBC on BMP every 3 months. RTC 2 months

## 2022-10-30 NOTE — Assessment & Plan Note (Signed)
DM: A1c was rechecked at LOV and much improved at 6.2.  Continue metformin, in coordination with our clinical pharmacist, will stop pioglitazone and start a GLP-1 for morbid obesity.. Feet exam negative Morbid obesity: Not making much progress, start GLP-1  High cholesterol: Continue Crestor, check a FLP AST ALT Discitis, lumbar osteomyelitis  Saw ID last month, they rec to continue oral antibiotics for infection suppression.  They requested a CBC on BMP every 3 months. RTC 2 months

## 2022-10-31 ENCOUNTER — Other Ambulatory Visit: Payer: Self-pay

## 2022-11-03 ENCOUNTER — Telehealth: Payer: Self-pay | Admitting: Internal Medicine

## 2022-11-03 ENCOUNTER — Other Ambulatory Visit (HOSPITAL_COMMUNITY): Payer: Self-pay

## 2022-11-03 NOTE — Telephone Encounter (Signed)
Pt called stating that the following pharmacy needed additional information regarding his mounjaro Rx in order to fill it:   [place] [facility] [address] [place] [address] Phone: [phone] Fax: [phone]

## 2022-11-03 NOTE — Telephone Encounter (Signed)
Patient notified. Will follow up in 2 to 3 weeks to see if he is tolerating. Plan to titrate to 5mg  weekly after 4 doses of 2.5mg .

## 2022-11-03 NOTE — Telephone Encounter (Signed)
Patient notified

## 2022-11-03 NOTE — Telephone Encounter (Signed)
Received Cover My Med Faxed request for prior authorization Key: [id number] Submitted today  Mounjaro 2.5mg  Approved 11/03/2022 to 11/03/2023

## 2022-11-07 ENCOUNTER — Telehealth: Payer: Self-pay | Admitting: Internal Medicine

## 2022-11-07 ENCOUNTER — Encounter: Payer: Self-pay | Admitting: Pharmacist

## 2022-11-07 NOTE — Telephone Encounter (Signed)
Patient states a few days ago he got an alert that blood glucose was low while he was sleeping - checked with finger stick and was 200 (after he had eaten to increase blood glucose).  He states today the sensor stopped working due to error.  He has worn this Lebre 3 sensor only 10 days.  Provided patient with information to request replacement sensor (sent mychart message) He does have another sensor at home that he can use.   If your FreeStyle [place] 2 sensor falls off before 14 days of use replace the sensor and start a new sensor. Please complete the online sensor support request form [email or web address] or contact our Customer Care Team at [phone].

## 2022-11-07 NOTE — Telephone Encounter (Signed)
Patient called and states Libre 3 sensor went bad and had to pull it out. Please call

## 2022-11-09 ENCOUNTER — Other Ambulatory Visit: Payer: Self-pay | Admitting: Physical Medicine and Rehabilitation

## 2022-11-10 ENCOUNTER — Other Ambulatory Visit (HOSPITAL_COMMUNITY): Payer: Self-pay

## 2022-11-10 ENCOUNTER — Other Ambulatory Visit: Payer: Self-pay

## 2022-11-10 MED ORDER — MORPHINE SULFATE ER 15 MG PO TBCR
15.0000 mg | EXTENDED_RELEASE_TABLET | Freq: Two times a day (BID) | ORAL | 0 refills | Status: DC
Start: 2022-11-10 — End: 2022-12-08
  Filled 2022-11-10: qty 60, 30d supply, fill #0

## 2022-11-11 ENCOUNTER — Other Ambulatory Visit: Payer: Self-pay

## 2022-11-11 ENCOUNTER — Other Ambulatory Visit (HOSPITAL_COMMUNITY): Payer: Self-pay

## 2022-11-12 ENCOUNTER — Encounter: Payer: Self-pay | Admitting: Internal Medicine

## 2022-11-12 ENCOUNTER — Other Ambulatory Visit (HOSPITAL_COMMUNITY): Payer: Self-pay

## 2022-11-12 MED ORDER — FREESTYLE LITE W/DEVICE KIT
1.0000 | PACK | Freq: Four times a day (QID) | 0 refills | Status: DC
  Filled 2022-11-12: qty 1, 1d supply, fill #0

## 2022-11-14 ENCOUNTER — Ambulatory Visit: Payer: 59 | Admitting: Physical Medicine and Rehabilitation

## 2022-11-14 ENCOUNTER — Other Ambulatory Visit (HOSPITAL_COMMUNITY): Payer: Self-pay

## 2022-11-14 MED ORDER — FREESTYLE LANCETS MISC
Freq: Four times a day (QID) | 0 refills | Status: DC
  Filled 2022-11-14: qty 200, 50d supply, fill #0

## 2022-11-14 MED ORDER — GLUCOSE BLOOD VI STRP
ORAL_STRIP | Freq: Four times a day (QID) | 0 refills | Status: DC
  Filled 2022-11-14: qty 200, 50d supply, fill #0

## 2022-11-24 ENCOUNTER — Other Ambulatory Visit (HOSPITAL_COMMUNITY): Payer: Self-pay

## 2022-11-24 ENCOUNTER — Ambulatory Visit (INDEPENDENT_AMBULATORY_CARE_PROVIDER_SITE_OTHER): Payer: 59 | Admitting: Pharmacist

## 2022-11-24 DIAGNOSIS — Z6841 Body Mass Index (BMI) 40.0 and over, adult: Secondary | ICD-10-CM

## 2022-11-24 DIAGNOSIS — Z7985 Long-term (current) use of injectable non-insulin antidiabetic drugs: Secondary | ICD-10-CM

## 2022-11-24 DIAGNOSIS — I10 Essential (primary) hypertension: Secondary | ICD-10-CM

## 2022-11-24 DIAGNOSIS — E7800 Pure hypercholesterolemia, unspecified: Secondary | ICD-10-CM

## 2022-11-24 DIAGNOSIS — E119 Type 2 diabetes mellitus without complications: Secondary | ICD-10-CM

## 2022-11-24 MED ORDER — TIRZEPATIDE 2.5 MG/0.5ML ~~LOC~~ SOAJ
2.5000 mg | SUBCUTANEOUS | 0 refills | Status: DC
  Filled 2022-11-24 – 2022-12-31 (×3): qty 2, 28d supply, fill #0

## 2022-11-24 MED ORDER — LOSARTAN POTASSIUM-HCTZ 50-12.5 MG PO TABS
1.0000 | ORAL_TABLET | Freq: Every day | ORAL | 1 refills | Status: DC
  Filled 2022-11-24 – 2022-12-08 (×2): qty 90, 90d supply, fill #0
  Filled 2023-03-07: qty 90, 90d supply, fill #1

## 2022-11-24 NOTE — Progress Notes (Signed)
11/24/2022 Name: WILLIAMS STROMGREN MRN: [id number] DOB: 06-02-1965  Chief Complaint  Patient presents with   Medication Management   Diabetes   Hypertension   Hyperlipidemia    [name] is a 57 y.o. year old male who presented for a phone visit for Medication, hyperlipidemia, hypertension and diabetes management.    They were referred to the pharmacist by their PCP for assistance in managing diabetes.   Subjective:  Care Team: Primary Care Provider: Wanda Plump, MD ; Next Scheduled Visit: 12/24/2022  Medication Access/Adherence  Current Pharmacy:  [place] [facility] [address] [place] [address] Phone: [phone] Fax: [phone]  [facility] [address] [place], [place] - [address] [place]. AT [facility] [address] [place]. [place] [address] Phone: [phone] Fax: [phone]   Patient reports affordability concerns with their medications: No  Patient reports access/transportation concerns to their pharmacy: No  Patient reports adherence concerns with their medications:  No      Diabetes:  Current medications:  Mounjaro 2.5mg  weekly (started 11/09/2022), metformin 1000mg  twice a day  Home blood glucose 120's to 130' s Using either Freestyle Lite or [place] 3 sensors.   Patient has not weighed since he has started [facility] but feels that it has helped to curb his appetite and he clothes are fitting looser.   He is still having nausea - usually the 3 days following his weekly injection. He vomited once but felt it was due to overeating.   Patient is enrolled in Diabetes Management program thru [employer].  Dex Com was not covered by his insurance plan because he is not currently using insulin to control DM. He did purchase 2 sensors of Libre 3 for $75 with cost reducing coupon. He had issues with sensors and provided contact information to him for [employer]. Patient reports  that his sensors were recalled and he was provided with 2 new sensors. He received new sensors a few days ago but has not started using again yet.   Eye Exam - patient states he has eye exam 06/2022 but the optometrist that he saw does not do dilated eye exam for diabetics. He is planning to see another optometrist that does diabetic eye exams.    Wt Readings from Last 3 Encounters:  10/29/22 (!) 321 lb (145.6 kg)  10/07/22 (!) 320 lb (145.2 kg)  09/22/22 (!) 320 lb 6 oz (145.3 kg)    Hypertension:  Current Medication: losartan hydrochlorothiazide 50/12.5mg  (dose was lowered recently from 100/25mg  - patient was taking 0.5 tablet for higher dose for awhile).  He requests Rx for 90 DS if possible  BP Readings from Last 3 Encounters:  10/29/22 138/80  10/07/22 133/78  09/22/22 136/80    Hyperlipidemia:  Taking rosuvastatin - refill is due and per patient is currently in process at [facility]  Objective:  Lab Results  Component Value Date   HGBA1C 6.2 09/22/2022    Lab Results  Component Value Date   CREATININE 1.12 09/22/2022   BUN 16 09/22/2022   NA 141 09/22/2022   K 3.9 09/22/2022   CL 106 09/22/2022   CO2 25 09/22/2022    Lab Results  Component Value Date   CHOL 85 10/29/2022   HDL 37.40 (L) 10/29/2022   LDLCALC 25 10/29/2022   LDLDIRECT 118.0 09/14/2020   TRIG 114.0 10/29/2022   CHOLHDL 2 10/29/2022    Medications  Reviewed Today     Reviewed by Henrene Pastor, RPH-CPP (Pharmacist) on 11/24/22 at 0912  Med List Status: <None>   Medication Order Taking? Sig Documenting Provider Last Dose Status Informant  ascorbic acid (VITAMIN C) 500 MG tablet [id number] Yes Take 1 tablet (500 mg total) by mouth 2 (two) times daily. Lovorn, Aundra Millet, MD Taking Active   Blood Glucose Monitoring Suppl (FREESTYLE LITE) w/Device KIT [id number] Yes Use to check blood sugar up to 4 times daily as directed Wanda Plump, MD Taking Active   cefadroxil (DURICEF) 500 MG capsule  [id number] Yes Take 1 capsule (500 mg total) by mouth 2 (two) times daily. Odette Fraction, MD Taking Active   cholecalciferol (VITAMIN D3) 25 MCG (1000 UNIT) tablet [id number] Yes Take 1,000 Units by mouth daily. [provider] Taking Active   Continuous Glucose Sensor (FREESTYLE LIBRE 3 SENSOR) [place] [id number] Yes Place 1 sensor on the skin every 14 days. Use to check glucose continuously Wanda Plump, MD Taking Active   fluticasone [facility]) 50 MCG/ACT nasal spray [id number] Yes Place 2 sprays into both nostrils daily. [provider] Taking Active   glucose blood (FREESTYLE LITE) test strip [id number] Yes Use 1 strip to check blood sugar up to 4 times daily as directed Wanda Plump, MD Taking Active   glucose blood test strip [id number] Yes Use to check blood sugar 4 (four) times daily as directed Wanda Plump, MD Taking Active   HYDROcodone-acetaminophen [facility]) 5-325 MG tablet [id number] Yes Take 1 tablet by mouth every 6 (six) hours as needed (breakthrough pain as needed). Genice Rouge, MD Taking Active            Med Note [place], Raynaldo Opitz Sep 22, 2022  8:19 AM)    Lancets (FREESTYLE) lancets [id number] Yes Use 1 lancet to check blood sugar up to 4 times daily as directed Wanda Plump, MD Taking Active   Lancets (FREESTYLE) lancets [id number] Yes Use to check blood sugar up to 4 (four) times daily as directed Wanda Plump, MD Taking Active   losartan-hydrochlorothiazide [facility]) 50-12.5 MG tablet [id number] Yes Take 1 tablet by mouth daily. Wanda Plump, MD Taking Active   metFORMIN (GLUCOPHAGE) 1000 MG tablet [id number] Yes Take 1 tablet (1,000 mg total) by mouth 2 (two) times daily with a meal. Wanda Plump, MD Taking Active   morphine (MS CONTIN) 15 MG 12 hr tablet [id number] Yes Take 1 tablet (15 mg total) by mouth every 12 (twelve) hours Lovorn, Aundra Millet, MD Taking Active   naloxone [facility]) nasal spray 4 mg/0.1 mL [id number] Yes Use as directed Wanda Plump, MD Taking Active             Med Note (CANTER, Vicente Males D   Mon Sep 22, 2022  8:20 AM) PRN  polyethylene glycol (MIRALAX / GLYCOLAX) 17 g packet [id number] Yes Take 17 g by mouth 2 (two) times daily. Jacquelynn Cree, PA-C Taking Active   rosuvastatin (CRESTOR) 20 MG tablet [id number] Yes Take 1 tablet (20 mg total) by mouth daily. Lewayne Bunting, MD Taking Active   senna (SENOKOT) 8.6 MG TABS tablet [id number] Yes Take 3 tablets (25.8 mg total) by mouth 2 (two) times daily. Jacquelynn Cree, PA-C Taking Active            Med Note (CANTER, Paula Libra   Fri May 03, 2021  9:31 AM) Prn constipation  simethicone (MYLICON) 80 MG chewable tablet [id number] Yes Chew 1  tablet (80 mg total) by mouth 4 (four) times daily as needed for flatulence. Jacquelynn Cree, PA-C Taking Active   tadalafil (CIALIS) 20 MG tablet [id number] Yes Take 0.5-1 tablets (10-20 mg total) by mouth every other day as needed for erectile dysfunction. Wanda Plump, MD Taking Active   tirzepatide [facility]) 2.5 MG/0.5ML Pen [id number] Yes Inject 2.5 mg into the skin once a week. Wanda Plump, MD Taking Active   tiZANidine (ZANAFLEX) 4 MG tablet [id number] Yes Take 1 tablet (4 mg total) by mouth 2 (two) times daily. Lovorn, Aundra Millet, MD Taking Active   Zinc Sulfate 220 (50 Zn) MG TABS [id number] Yes Take 1 tablet (220 mg total) by mouth daily. Lovorn, Aundra Millet, MD Taking Active               Assessment/Plan:   Diabetes / obesity class 3 : Last A1c was at goal but changed to [facility] to see if can help with weight loss as well. Patient feels Greggory Keen is helping but having some nausea still. - Continue Mounjaro 2.5mg  weekly for 4 more weeks. Will then reassess to try to increase to 5mg  weekly - Continue metformin 1000mg  twice a day.  - Restart using Continuous Glucose Monitor to check blood glucose. - Continue to follow up with [employer] .  - Discussed importance of annual eye exam - patient is planning to see provider for diabetic eye exam.   Meds ordered this  encounter  Medications   tirzepatide (MOUNJARO) 2.5 MG/0.5ML Pen    Sig: Inject 2.5 mg into the skin once a week.    Dispense:  2 mL    Refill:  0   losartan-hydrochlorothiazide (HYZAAR) 50-12.5 MG tablet    Sig: Take 1 tablet by mouth daily.    Dispense:  90 tablet    Refill:  1    Hypertension:  Continue - losartan hydrochlorothiazide 50/12.5mg  daily - updated Rx for 90 DS.  Hyperlipidemia: LDL goal < 100 Continue to take rosuvastatin 20mg  daily  Medication Adherence:  Discussed adherence and reviewed med list. Updated med list.   Henrene Pastor, PharmD Clinical Pharmacist [facility]

## 2022-11-27 ENCOUNTER — Encounter (INDEPENDENT_AMBULATORY_CARE_PROVIDER_SITE_OTHER): Payer: Self-pay

## 2022-12-01 ENCOUNTER — Ambulatory Visit: Payer: 59 | Admitting: Internal Medicine

## 2022-12-03 ENCOUNTER — Encounter: Payer: Self-pay | Admitting: Internal Medicine

## 2022-12-05 ENCOUNTER — Other Ambulatory Visit: Payer: Self-pay

## 2022-12-08 ENCOUNTER — Encounter: Payer: 59 | Attending: Physical Medicine and Rehabilitation | Admitting: Physical Medicine and Rehabilitation

## 2022-12-08 ENCOUNTER — Other Ambulatory Visit: Payer: Self-pay | Admitting: Physical Medicine and Rehabilitation

## 2022-12-08 ENCOUNTER — Other Ambulatory Visit (HOSPITAL_COMMUNITY): Payer: Self-pay

## 2022-12-08 DIAGNOSIS — M7918 Myalgia, other site: Secondary | ICD-10-CM | POA: Insufficient documentation

## 2022-12-08 DIAGNOSIS — G8929 Other chronic pain: Secondary | ICD-10-CM | POA: Insufficient documentation

## 2022-12-08 DIAGNOSIS — M159 Polyosteoarthritis, unspecified: Secondary | ICD-10-CM | POA: Insufficient documentation

## 2022-12-08 DIAGNOSIS — M5106 Intervertebral disc disorders with myelopathy, lumbar region: Secondary | ICD-10-CM | POA: Insufficient documentation

## 2022-12-08 DIAGNOSIS — M4646 Discitis, unspecified, lumbar region: Secondary | ICD-10-CM | POA: Insufficient documentation

## 2022-12-09 ENCOUNTER — Other Ambulatory Visit (HOSPITAL_COMMUNITY): Payer: Self-pay

## 2022-12-09 ENCOUNTER — Other Ambulatory Visit: Payer: Self-pay

## 2022-12-09 MED ORDER — MORPHINE SULFATE ER 15 MG PO TBCR
15.0000 mg | EXTENDED_RELEASE_TABLET | Freq: Two times a day (BID) | ORAL | 0 refills | Status: DC
Start: 2022-12-09 — End: 2023-01-19
  Filled 2022-12-09: qty 60, 30d supply, fill #0

## 2022-12-12 ENCOUNTER — Other Ambulatory Visit (HOSPITAL_COMMUNITY): Payer: Self-pay

## 2022-12-24 ENCOUNTER — Ambulatory Visit: Payer: 59 | Admitting: Internal Medicine

## 2022-12-31 ENCOUNTER — Other Ambulatory Visit (HOSPITAL_COMMUNITY): Payer: Self-pay

## 2023-01-12 ENCOUNTER — Other Ambulatory Visit (HOSPITAL_COMMUNITY): Payer: Self-pay

## 2023-01-19 ENCOUNTER — Other Ambulatory Visit (HOSPITAL_COMMUNITY): Payer: Self-pay

## 2023-01-19 ENCOUNTER — Encounter: Payer: 59 | Attending: Physical Medicine and Rehabilitation | Admitting: Physical Medicine and Rehabilitation

## 2023-01-19 ENCOUNTER — Encounter: Payer: Self-pay | Admitting: Physical Medicine and Rehabilitation

## 2023-01-19 VITALS — BP 122/83 | HR 114 | Ht 74.0 in | Wt 306.0 lb

## 2023-01-19 DIAGNOSIS — M5106 Intervertebral disc disorders with myelopathy, lumbar region: Secondary | ICD-10-CM | POA: Insufficient documentation

## 2023-01-19 DIAGNOSIS — Z794 Long term (current) use of insulin: Secondary | ICD-10-CM | POA: Diagnosis not present

## 2023-01-19 DIAGNOSIS — E1159 Type 2 diabetes mellitus with other circulatory complications: Secondary | ICD-10-CM | POA: Insufficient documentation

## 2023-01-19 DIAGNOSIS — M7918 Myalgia, other site: Secondary | ICD-10-CM | POA: Insufficient documentation

## 2023-01-19 DIAGNOSIS — N529 Male erectile dysfunction, unspecified: Secondary | ICD-10-CM | POA: Diagnosis not present

## 2023-01-19 DIAGNOSIS — G894 Chronic pain syndrome: Secondary | ICD-10-CM | POA: Diagnosis not present

## 2023-01-19 MED ORDER — MORPHINE SULFATE ER 15 MG PO TBCR
15.0000 mg | EXTENDED_RELEASE_TABLET | Freq: Two times a day (BID) | ORAL | 0 refills | Status: DC
Start: 2023-01-19 — End: 2023-02-16
  Filled 2023-01-19: qty 60, 30d supply, fill #0

## 2023-01-19 MED ORDER — LIDOCAINE HCL 1 % IJ SOLN
3.0000 mL | Freq: Once | INTRAMUSCULAR | Status: AC
Start: 2023-01-19 — End: 2023-01-19
  Administered 2023-01-19: 3 mL

## 2023-01-19 NOTE — Patient Instructions (Signed)
Plan: Has Urologist- at [facility]- call them- get an appointment- to discuss ED issues as well.  -discussed how incomplete paraplegia also plays a role as well as DM and pain meds.   2. Trying to reduce the MS Contin to 1x/day- but will see if that makes a difference in ED.  3. Will refill MS Contin 15 mg 2x/day, but hopefully can wean to 1x/day.    4. Went over A1c 6.2- and how that plays a part with ED.    5.  Has ESI- epidural steroid injection already scheduled- last one was 5/1- scheduled 10/14 at 10:30am at [facility] office.   6. Patient here for trigger point injections for myofascial pain  Consent done and on chart.  Cleaned areas with alcohol and injected using a 27 gauge 1.5 inch needle  Injected 3cc- none wasted Using 1% Lidocaine with no EPI  Upper traps Levators Posterior scalenes Middle scalenes Splenius Capitus Pectoralis Major Rhomboids Infraspinatus Teres Major/minor Thoracic paraspinals Lumbar paraspinals- 5 injections in L lumbar paraspinals to break up very large TrP.  Other injections-    Patient's level of pain prior was 8/10 Current level of pain after injections is- down to 4-5/10 even after 1-2 minutes.   There was no bleeding or complications.  Patient was advised to drink a lot of water on day after injections to flush system Will have increased soreness for 12-48 hours after injections.  Can use Lidocaine patches the day AFTER injections Can use theracane on day of injections in places didn't inject Can use heating pad 4-6 hours AFTER injections  7. F/U in 3 months. - trp injections.  And f/u on ED/incomplete paraplegia

## 2023-01-19 NOTE — Addendum Note (Signed)
Addended by: Silas Sacramento T on: 01/19/2023 09:31 AM   Modules accepted: Orders

## 2023-01-19 NOTE — Progress Notes (Signed)
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  Also has DM- last A1c 6.2.      And for trigger point injections.          Pain come back Last ESI was 08/13/22-  Knot in low back coming back- so wants trigger point injections.   Needs a new refill of Pain meds.   ED issues- asking about how pain meds could play a role.  Has Rx for Cialis Viagra- has also tried- doesn't know dose.  Didn't have any ED issues until after surgery.  Can get Partial erection- only lasts 2-3 minutes- don't wak eup with one- but can when tries.   has been to [facility]- kept it hard for 45 minutes- with injection. - was back in 2020- gave him higher dose Cialis as well.    Gets in 31mile/day walking- separately other than just normal walking.  Gym- gave up on because gym started to hurt.  Gave up membership for now- til January 1st.  Plan: Has Urologist- at [facility]- call them- get an appointment- to discuss ED issues as well.  -discussed how incomplete paraplegia also plays a role as well as DM and pain meds.   2. Trying to reduce the MS Contin to 1x/day- but will see if that makes a difference in ED.  3. Will refill MS Contin 15 mg 2x/day, but hopefully can wean to 1x/day.    4. Went over A1c 6.2- and how that plays a part with ED.    5.  Has ESI- epidural steroid injection already scheduled- last one was 5/1- scheduled 10/14 at 10:30am at [facility] office.   6. Patient here for trigger point injections for myofascial pain  Consent done and on chart.  Cleaned areas with alcohol and injected using a 27 gauge 1.5 inch needle  Injected 3cc- none wasted Using 1% Lidocaine with no EPI  Upper  traps Levators Posterior scalenes Middle scalenes Splenius Capitus Pectoralis Major Rhomboids Infraspinatus Teres Major/minor Thoracic paraspinals Lumbar paraspinals- 5 injections in L lumbar paraspinals to break up very large TrP.  Other injections-    Patient's level of pain prior was 8/10 Current level of pain after injections is- down to 4-5/10 even after 1-2 minutes.   There was no bleeding or complications.  Patient was advised to drink a lot of water on day after injections to flush system Will have increased soreness for 12-48 hours after injections.  Can use Lidocaine patches the day AFTER injections Can use theracane on day of injections in places didn't inject Can use heating pad 4-6 hours AFTER injections  7. F/U in 3 months. - trp injections.  And f/u on ED/incomplete paraplegia  I spent a total of     minutes on total care today- >50% coordination of care- due to

## 2023-01-21 ENCOUNTER — Ambulatory Visit (INDEPENDENT_AMBULATORY_CARE_PROVIDER_SITE_OTHER): Payer: 59 | Admitting: Pharmacist

## 2023-01-21 DIAGNOSIS — E66813 Obesity, class 3: Secondary | ICD-10-CM

## 2023-01-21 DIAGNOSIS — E7800 Pure hypercholesterolemia, unspecified: Secondary | ICD-10-CM

## 2023-01-21 DIAGNOSIS — Z7985 Long-term (current) use of injectable non-insulin antidiabetic drugs: Secondary | ICD-10-CM

## 2023-01-21 DIAGNOSIS — Z6841 Body Mass Index (BMI) 40.0 and over, adult: Secondary | ICD-10-CM

## 2023-01-21 DIAGNOSIS — I10 Essential (primary) hypertension: Secondary | ICD-10-CM

## 2023-01-21 DIAGNOSIS — E119 Type 2 diabetes mellitus without complications: Secondary | ICD-10-CM

## 2023-01-21 NOTE — Progress Notes (Signed)
01/21/2023 Name: [name] MRN: [id number] DOB: 03/13/1966  Chief Complaint  Patient presents with   Diabetes   Medication Management    [name] is a 57 y.o. year old male who presented for a phone visit for Medication, hyperlipidemia, hypertension and diabetes management.    They were referred to the pharmacist by their PCP for assistance in managing diabetes.   Subjective:  Care Team: Primary Care Provider: Wanda Plump, MD ; Next Scheduled Visit: 01/30/2023  Medication Access/Adherence  Current Pharmacy:  [place] [facility] [address] [place] [address] Phone: [phone] Fax: [phone]  [facility] [address] [place], [place] - [address] [place]. AT [facility] [address] [place]. [place] [address] Phone: [phone] Fax: [phone]   Patient reports affordability concerns with their medications: No  Patient reports access/transportation concerns to their pharmacy: No  Patient reports adherence concerns with their medications:  No      Diabetes:  Current medications:  Mounjaro 2.5mg  weekly (started 11/09/2022), metformin 1000mg  twice a day  Starting weight = 320 lbs Current weight = 306 lbs Starting BMI = 41.13 Current BMI = 39.29 Total weight loss = 14lbs  Home blood glucose:  Using either Freestyle Lite to check blood glucose.  He stopped using Libre 3 sensors because it was giving inconsistent readings.   Wt Readings from Last 3 Encounters:  01/19/23 (!) 306 lb (138.8 kg)  10/29/22 (!) 321 lb (145.6 kg)  10/07/22 (!) 320 lb (145.2 kg)     [name] reports he is having a lot of acid / indigestion. He is currently taking Tums as needed.  He is still having a little nausea - usually the 2 days following his weekly injection. He has vomited once in the last week.  Patient endorses that he is eating smaller meals and has been more vigilant to when he starts to  feel full, to stop eating.  Denies constipation - has taken Senna 2 tablets twice a day for awhile because he takes opioid.   Patient is enrolled in Diabetes Management program thru [employer].  Eye Exam - patient states he has eye exam 06/2022 but the optometrist that he saw does not do dilated eye exam for diabetics. He is planning to see another optometrist that does diabetic eye exams.  Has not been able to schedule appointment yet.    Hypertension:  Current Medication: losartan hydrochlorothiazide 50/12.5mg  daily  Denies dizziness or lightheadedness  BP Readings from Last 3 Encounters:  01/19/23 122/83  10/29/22 138/80  10/07/22 133/78    Hyperlipidemia:  Taking rosuvastatin 20mg  daily    Objective:  Lab Results  Component Value Date   HGBA1C 6.2 09/22/2022    Lab Results  Component Value Date   CREATININE 1.12 09/22/2022   BUN 16 09/22/2022   NA 141 09/22/2022   K 3.9 09/22/2022   CL 106 09/22/2022   CO2 25 09/22/2022    Lab Results  Component Value Date   CHOL 85 10/29/2022   HDL 37.40 (L) 10/29/2022   LDLCALC 25 10/29/2022   LDLDIRECT 118.0 09/14/2020   TRIG 114.0 10/29/2022   CHOLHDL 2 10/29/2022    Medications Reviewed Today   Medications were not reviewed in this encounter       Assessment/Plan:   Diabetes / obesity class 3 : Last A1c was at goal and weight has improved with Mounjaro 2.5mg   weekly. However patient is having increase in acid indigestion and nausea, preventing escalation of Moujaro dose.  - Continue Mounjaro 2.5mg  weekly for now - would like to increase dose if able. - Recommended he try Pepcid Complete for indigestion / acid reflux. He will see Dr [name] 01/30/2023 to discuss further.  - Continue metformin 1000mg  twice a day.  - Continue to  check blood glucose daily  - Reminded to eat small meals and monitor for when he feels full.  - Continue to follow up with [employer] .  - Reminded patient to  scheduled annual diabetic eye exam.    Hypertension:  Continue - losartan hydrochlorothiazide 50/12.5mg  daily  Hyperlipidemia: LDL goal < 100 Continue to take rosuvastatin 20mg  daily  Medication Adherence:  Discussed adherence and reviewed med list.   [name], PharmD Clinical Pharmacist [facility]

## 2023-01-26 ENCOUNTER — Encounter (HOSPITAL_COMMUNITY): Payer: Self-pay

## 2023-01-26 ENCOUNTER — Other Ambulatory Visit: Payer: Self-pay | Admitting: Internal Medicine

## 2023-01-26 ENCOUNTER — Other Ambulatory Visit (HOSPITAL_COMMUNITY): Payer: Self-pay

## 2023-01-26 ENCOUNTER — Other Ambulatory Visit: Payer: Self-pay

## 2023-01-26 DIAGNOSIS — M5416 Radiculopathy, lumbar region: Secondary | ICD-10-CM | POA: Diagnosis not present

## 2023-01-26 MED ORDER — TIRZEPATIDE 5 MG/0.5ML ~~LOC~~ SOAJ
5.0000 mg | SUBCUTANEOUS | 0 refills | Status: DC
  Filled 2023-01-26: qty 2, 28d supply, fill #0

## 2023-01-30 ENCOUNTER — Encounter: Payer: Self-pay | Admitting: Internal Medicine

## 2023-01-30 ENCOUNTER — Ambulatory Visit (INDEPENDENT_AMBULATORY_CARE_PROVIDER_SITE_OTHER): Payer: 59 | Admitting: Internal Medicine

## 2023-01-30 VITALS — BP 138/80 | HR 94 | Temp 98.0°F | Resp 18 | Ht 74.0 in | Wt 305.5 lb

## 2023-01-30 DIAGNOSIS — E1159 Type 2 diabetes mellitus with other circulatory complications: Secondary | ICD-10-CM | POA: Diagnosis not present

## 2023-01-30 DIAGNOSIS — M4646 Discitis, unspecified, lumbar region: Secondary | ICD-10-CM | POA: Diagnosis not present

## 2023-01-30 DIAGNOSIS — Z23 Encounter for immunization: Secondary | ICD-10-CM | POA: Diagnosis not present

## 2023-01-30 DIAGNOSIS — K219 Gastro-esophageal reflux disease without esophagitis: Secondary | ICD-10-CM | POA: Diagnosis not present

## 2023-01-30 DIAGNOSIS — E559 Vitamin D deficiency, unspecified: Secondary | ICD-10-CM | POA: Diagnosis not present

## 2023-01-30 DIAGNOSIS — G4733 Obstructive sleep apnea (adult) (pediatric): Secondary | ICD-10-CM

## 2023-01-30 LAB — CBC WITH DIFFERENTIAL/PLATELET
Basophils Absolute: 0 10*3/uL (ref 0.0–0.1)
Basophils Relative: 0.4 % (ref 0.0–3.0)
Eosinophils Absolute: 0.1 10*3/uL (ref 0.0–0.7)
Eosinophils Relative: 1.7 % (ref 0.0–5.0)
HCT: 47.6 % (ref 39.0–52.0)
Hemoglobin: 15.6 g/dL (ref 13.0–17.0)
Lymphocytes Relative: 10.2 % — ABNORMAL LOW (ref 12.0–46.0)
Lymphs Abs: 0.8 10*3/uL (ref 0.7–4.0)
MCHC: 32.7 g/dL (ref 30.0–36.0)
MCV: 90.3 fL (ref 78.0–100.0)
Monocytes Absolute: 0.5 10*3/uL (ref 0.1–1.0)
Monocytes Relative: 5.6 % (ref 3.0–12.0)
Neutro Abs: 6.7 10*3/uL (ref 1.4–7.7)
Neutrophils Relative %: 82.1 % — ABNORMAL HIGH (ref 43.0–77.0)
Platelets: 264 10*3/uL (ref 150.0–400.0)
RBC: 5.27 Mil/uL (ref 4.22–5.81)
RDW: 14.4 % (ref 11.5–15.5)
WBC: 8.2 10*3/uL (ref 4.0–10.5)

## 2023-01-30 LAB — MICROALBUMIN / CREATININE URINE RATIO
Creatinine,U: 205.7 mg/dL
Microalb Creat Ratio: 0.5 mg/g (ref 0.0–30.0)
Microalb, Ur: 1 mg/dL (ref 0.0–1.9)

## 2023-01-30 LAB — BASIC METABOLIC PANEL
BUN: 18 mg/dL (ref 6–23)
CO2: 26 meq/L (ref 19–32)
Calcium: 9.6 mg/dL (ref 8.4–10.5)
Chloride: 99 meq/L (ref 96–112)
Creatinine, Ser: 1.24 mg/dL (ref 0.40–1.50)
GFR: 63.61 mL/min (ref >60.00)
Glucose, Bld: 113 mg/dL — ABNORMAL HIGH (ref 70–99)
Potassium: 4 meq/L (ref 3.5–5.1)
Sodium: 138 meq/L (ref 135–145)

## 2023-01-30 LAB — VITAMIN D 25 HYDROXY (VIT D DEFICIENCY, FRACTURES): VITD: 32.28 ng/mL (ref 30.00–100.00)

## 2023-01-30 LAB — HEMOGLOBIN A1C: Hgb A1c MFr Bld: 6 % (ref 4.6–6.5)

## 2023-01-30 NOTE — Progress Notes (Unsigned)
Subjective:    Patient ID: [name], male    DOB: 26-Jun-1965, 57 y.o.   MRN: [id number]  DOS:  01/30/2023 Type of visit - description: Follow-up  Chronic medical problems addressed. In general feeling well. Developed heartburn, take it Pepcid Complete OTC with good control of symptoms. Not using a CPAP, denies snoring, energy is good.  Wt Readings from Last 3 Encounters:  01/30/23 (!) 305 lb 8 oz (138.6 kg)  01/19/23 (!) 306 lb (138.8 kg)  10/29/22 (!) 321 lb (145.6 kg)    Review of Systems See above   Past Medical History:  Diagnosis Date   Abdominal pain 01/25/2021   DDD (degenerative disc disease), lumbar    Diabetes mellitus    Hyperlipemia    Hypertension    Obesity    OSA (obstructive sleep apnea)    on CPAP    Past Surgical History:  Procedure Laterality Date   APPLICATION OF ROBOTIC ASSISTANCE FOR SPINAL PROCEDURE N/A 01/11/2021   Procedure: APPLICATION OF ROBOTIC ASSISTANCE FOR SPINAL PROCEDURE;  Surgeon: Jadene Pierini, MD;  Location: [facility] OR;  Service: Neurosurgery;  Laterality: N/A;   APPLICATION OF WOUND VAC N/A 01/25/2021   Procedure: APPLICATION OF WOUND VAC;  Surgeon: Dawley, Alan Mulder, DO;  Location: [facility] OR;  Service: Neurosurgery;  Laterality: N/A;   BACK SURGERY     COLONOSCOPY  2019   LUMBAR WOUND DEBRIDEMENT N/A 01/25/2021   Procedure: Irrigation and Debridement of lumbar wound, and wound vacuum assisted closure;  Surgeon: Dawley, Alan Mulder, DO;  Location: [facility] OR;  Service: Neurosurgery;  Laterality: N/A;   TRANSFORAMINAL LUMBAR INTERBODY FUSION (TLIF) WITH PEDICLE SCREW FIXATION 4 LEVEL N/A 01/11/2021   Procedure: Lumbar one-two, Lumbar two-three, Lumbar three-four, Lumbar four-five, Lumbar five Sacral one Open decompression, Transforaminal lumbar interbody fusion, posterolateral instrumented fusion;  Surgeon: Jadene Pierini, MD;  Location: [facility] OR;  Service: Neurosurgery;  Laterality: N/A;    Current Outpatient Medications  Medication  Instructions   ascorbic acid (VITAMIN C) 500 mg, Oral, 2 times daily   Blood Glucose Monitoring Suppl (FREESTYLE LITE) w/Device KIT Use to check blood sugar up to 4 times daily as directed   cefadroxil (DURICEF) 500 mg, Oral, 2 times daily   cholecalciferol (VITAMIN D3) 1,000 Units, Oral, Daily   Continuous Glucose Sensor (FREESTYLE LIBRE 3 SENSOR) MISC Place 1 sensor on the skin every 14 days. Use to check glucose continuously   fluticasone (FLONASE) 50 MCG/ACT nasal spray 2 sprays, Each Nare, Daily   glucose blood (FREESTYLE LITE) test strip Use 1 strip to check blood sugar up to 4 times daily as directed   glucose blood test strip Use to check blood sugar 4 (four) times daily as directed   HYDROcodone-acetaminophen (NORCO) 5-325 MG tablet 1 tablet, Oral, Every 6 hours PRN   Lancets (FREESTYLE) lancets Use to check blood sugar up to 4 (four) times daily as directed   losartan-hydrochlorothiazide (HYZAAR) 50-12.5 MG tablet 1 tablet, Oral, Daily   metFORMIN (GLUCOPHAGE) 1,000 mg, Oral, 2 times daily with meals   morphine (MS CONTIN) 15 MG 12 hr tablet Take 1 tablet (15 mg total) by mouth every 12 (twelve) hours   Mounjaro 5 mg, Subcutaneous, Weekly   naloxone (NARCAN) nasal spray 4 mg/0.1 mL Use as directed   polyethylene glycol (MIRALAX / GLYCOLAX) 17 g, Oral, 2 times daily   rosuvastatin (CRESTOR) 20 mg, Oral, Daily   senna (SENOKOT) 25.8 mg, Oral, 2 times daily   simethicone (MYLICON)  80 mg, Oral, 4 times daily PRN   tadalafil (CIALIS) 10-20 mg, Oral, Every 48 hours PRN   tiZANidine (ZANAFLEX) 4 mg, Oral, 2 times daily   Zinc Sulfate 220 mg, Oral, Daily       Objective:   Physical Exam BP 138/80   Pulse 94   Temp 98 F (36.7 C) (Oral)   Resp 18   Ht 6\' 2"  (1.88 m)   Wt (!) 305 lb 8 oz (138.6 kg)   SpO2 95%   BMI 39.22 kg/m  General:   Well developed, NAD, BMI noted. HEENT:  Normocephalic . Face symmetric, atraumatic Lungs:  CTA B Normal respiratory effort, no  intercostal retractions, no accessory muscle use. Heart: RRR,  no murmur.  Lower extremities: no pretibial edema bilaterally  Skin: Not pale. Not jaundice Neurologic:  alert & oriented X3.  Speech normal, gait appropriate for age and unassisted Psych--  Cognition and judgment appear intact.  Cooperative with normal attention span and concentration.  Behavior appropriate. No anxious or depressed appearing.      Assessment    Assessment DM NO neuropathy per [facility] 07-2020.  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  + Weight loss noted.  CBGs typically in the 120s.  Encouraged healthy diet.  Check A1c and micro. HTN:On Hyzaar, BP looks good, checking labs. Discitis, lumbar osteomyelitis: On chronic ABX, to see ID soon.  Check a BMP and CBC OSA: Currently not on CPAP due to weight loss in previous years.  Denies snoring or lack of energy Vitamin D: History of deficiency, on OTCs, checking labs GERD: Started with some symptoms, well-controlled with Pepcid Complete OTC.  Reassess on RTC Preventive care: Flu shot today. RTC 3 months CPX

## 2023-01-30 NOTE — Patient Instructions (Addendum)
Vaccines I recommend: Covid booster   Check the  blood pressure regularly Blood pressure goal:  between 110/65 and  135/85. If it is consistently higher or lower, let me know     GO TO THE LAB : Get the blood work     Next visit with me in 3 months for a physical exam    Please schedule it at the front desk     Per our records you are due for your diabetic eye exam. Please contact your eye doctor to schedule an appointment. Please have them send copies of your office visit notes to [name]. Our fax number is [phone]. If you need a referral to an eye doctor please let us know.

## 2023-01-31 NOTE — Assessment & Plan Note (Signed)
DM: Currently on metformin and Mounjaro 2.5 mg, plans is to step up Mounjaro to 5 mg weekly.  + Weight loss noted.  CBGs typically in the 120s.  Encouraged healthy diet.  Check A1c and micro. HTN:On Hyzaar, BP looks good, checking labs. Discitis, lumbar osteomyelitis: On chronic ABX, to see ID soon.  Check a BMP and CBC OSA: Currently not on CPAP due to weight loss in previous years.  Denies snoring or lack of energy Vitamin D: History of deficiency, on OTCs, checking labs GERD: Started with some symptoms, well-controlled with Pepcid Complete OTC.  Reassess on RTC Preventive care: Flu shot today. RTC 3 months CPX

## 2023-02-05 ENCOUNTER — Encounter: Payer: Self-pay | Admitting: Infectious Diseases

## 2023-02-05 ENCOUNTER — Ambulatory Visit: Payer: 59 | Admitting: Infectious Diseases

## 2023-02-05 ENCOUNTER — Other Ambulatory Visit (HOSPITAL_COMMUNITY): Payer: Self-pay

## 2023-02-05 ENCOUNTER — Other Ambulatory Visit: Payer: Self-pay

## 2023-02-05 VITALS — BP 132/90 | HR 100 | Temp 97.7°F | Ht 74.0 in | Wt 303.0 lb

## 2023-02-05 DIAGNOSIS — T847XXD Infection and inflammatory reaction due to other internal orthopedic prosthetic devices, implants and grafts, subsequent encounter: Secondary | ICD-10-CM

## 2023-02-05 DIAGNOSIS — T8463XD Infection and inflammatory reaction due to internal fixation device of spine, subsequent encounter: Secondary | ICD-10-CM

## 2023-02-05 DIAGNOSIS — Z79899 Other long term (current) drug therapy: Secondary | ICD-10-CM | POA: Diagnosis not present

## 2023-02-05 MED ORDER — CEFADROXIL 500 MG PO CAPS
500.0000 mg | ORAL_CAPSULE | Freq: Two times a day (BID) | ORAL | 5 refills | Status: DC
Start: 2023-02-05 — End: 2023-06-18
  Filled 2023-02-05 – 2023-03-02 (×2): qty 60, 30d supply, fill #0
  Filled 2023-03-31: qty 60, 30d supply, fill #1
  Filled 2023-04-28: qty 60, 30d supply, fill #2
  Filled 2023-06-02: qty 60, 30d supply, fill #3

## 2023-02-05 NOTE — Progress Notes (Addendum)
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   Klebsiella pneumoniae infection 01/28/2021   Wound infection after surgery 01/25/2021   Muscle spasms of both lower extremities 01/25/2021   Spleen enlarged 01/25/2021   Lumbar radiculopathy 01/11/2021   Neuropathy 09/28/2020   Chronic pain syndrome 09/28/2020   Intervertebral lumbar disc disorder with myelopathy, lumbar region 09/28/2020   Spondylosis, cervical, with myelopathy 09/28/2020   PCP NOTES >>>>>>>> 07/09/2015   DJD (degenerative joint disease) 10/27/2014   OSA (obstructive sleep apnea) 06/14/2012   ED (erectile dysfunction) 04/26/2012   Annual physical exam 12/29/2011   Achilles tendinitis 09/10/2010   Allergic rhinitis 07/12/2010   Hyperlipidemia 04/26/2007   DM II (diabetes mellitus, type II), controlled (HCC) 04/22/2006   Essential hypertension 04/22/2006   Current Outpatient Medications on File Prior to Visit  Medication Sig Dispense Refill   ascorbic acid (VITAMIN C) 500 MG tablet Take 1 tablet (500 mg total) by mouth 2 (two) times daily. 100 tablet 3   Blood Glucose Monitoring Suppl (FREESTYLE LITE) w/Device KIT Use to check blood sugar up to 4 times daily as directed 1 kit 0   cefadroxil (DURICEF) 500 MG capsule Take 1 capsule (500 mg total) by mouth 2 (two) times daily. 60 capsule 3   cholecalciferol (VITAMIN D3) 25 MCG (1000 UNIT) tablet Take 1,000 Units by mouth daily.     Continuous Glucose Sensor (FREESTYLE LIBRE 3 SENSOR) MISC Place 1 sensor on the skin every 14 days. Use to check glucose continuously (Patient not taking:  Reported on 01/21/2023) 2 each 5   fluticasone (FLONASE) 50 MCG/ACT nasal spray Place 2 sprays into both nostrils daily.     glucose blood (FREESTYLE LITE) test strip Use 1 strip to check blood sugar up to 4 times daily as directed 200 each 0   glucose blood test strip Use to check blood sugar 4 (four) times daily as directed 200 each 0   HYDROcodone-acetaminophen (NORCO) 5-325 MG tablet Take 1 tablet by mouth every 6 (six) hours as needed (breakthrough pain as needed). 90 tablet 0   Lancets (FREESTYLE) lancets Use to check blood sugar up to 4 (four) times daily as directed 200 each 0   losartan-hydrochlorothiazide (HYZAAR) 50-12.5 MG tablet Take 1 tablet by mouth daily. 90 tablet 1   metFORMIN (GLUCOPHAGE) 1000 MG tablet Take 1 tablet (1,000 mg total) by mouth 2 (two) times daily with a meal. 180 tablet 1   morphine (MS CONTIN) 15 MG 12 hr tablet Take 1 tablet (15 mg total) by mouth every 12 (twelve) hours 60 tablet 0   naloxone (NARCAN) nasal spray 4 mg/0.1 mL Use as directed (Patient not taking: Reported on 01/30/2023) 2 each 1   polyethylene glycol (MIRALAX / GLYCOLAX) 17 g packet Take 17 g by mouth 2 (two) times daily. 14 each 0   rosuvastatin (CRESTOR) 20 MG tablet Take 1 tablet (20 mg total) by mouth daily. 90 tablet 3   senna (SENOKOT) 8.6 MG TABS tablet Take 3 tablets (25.8 mg total) by mouth 2 (two) times daily. 270 tablet 0  simethicone (MYLICON) 80 MG chewable tablet Chew 1 tablet (80 mg total) by mouth 4 (four) times daily as needed for flatulence. 60 tablet 0   tadalafil (CIALIS) 20 MG tablet Take 0.5-1 tablets (10-20 mg total) by mouth every other day as needed for erectile dysfunction. 30 tablet 3   tirzepatide (MOUNJARO) 5 MG/0.5ML Pen Inject 5 mg into the skin once a week. 2 mL 0   tiZANidine (ZANAFLEX) 4 MG tablet Take 1 tablet (4 mg total) by mouth 2 (two) times daily. 60 tablet 5   Zinc Sulfate 220 (50 Zn) MG TABS Take 1 tablet (220 mg total) by mouth daily. 100 tablet 5   No  current facility-administered medications on file prior to visit.    Subjective: [name] is a 56 Year old male with PMH of DM, HLD, HTN, Obesity/OSA and DDD s/p recent TLIFwith pedicel screw fixation on 9/30 complicated by Kleb pneumo bacteremia post operatively concerning for surgical site infection following 10/14 debridement of lumbar wound, concerning for deep lumbar wound infection, planned for 6 weeks IV cefazolin, however course complicated by worsening back pain and increased inflammatory markers. MRI was repeated 10/29 osteomyelitis discitis at L1-2 with associated small amount of epidural phlegmon and/or abscess within the left ventral epidural space extending from L1-2 to approximately L2-3/Associated paraspinous edema/phlegmon within the adjacent left greater than right psoas musculature, with superimposed 1 cm left psoas soft tissue abscess.  Neurosurgery did not feel another surgery was indicated and recommended IR consult for drainage of left psoas abscess. IR felt the findings in the left psoas was more of oedema than an abscess and was not drained. ID recommended repeat MRI in 2 weeks. Repeat MRI 11/23 with overall similar findings, left psoas abscess ( 1.2cm , previously 0.9cm), Additional enhancing fluid collection at the anterior left aspect of the L1-L2 disc space measures approximately 1.6 x 3.1 x 3.2 cm likely an additional focus of abscess. Small amount of phlegmon within the left ventral epidural space L2-L3. MRI findings discussed with Dr Daiva Eves and recommended IV abtx for 4 weeks with ID follow up. Patient discharged from the hospital 03/08/21.   IV cefazolin was switched to IV ceftriaxone 04/11/2021 in the setting of  hardware loosening and erosive changes seen in the recent CT mechanical failure or infection related while on prolonged IV cefazolin course. IV ceftriaxone was continued until 04/30/21 followed by PO suppression with cefadroxil due to + of hardware which he has been  consistently taking so far  He has been following closely with pain medicine for pain management as well as neurosurgery.  Lumbar back wound has healed   10/07/22 Taking PO cefadroxil as instructed without any concerns. Still getting steroid injections  in the back and last injection a week ago was not administered in the usual location and had bilateral leg numbness and sharp back pain following the procedure. Despite these symptoms, he plans to continue with her scheduled injections every six weeks, with the next one due in January. He is looking forward to resuming swimming, which he has been unable to do for the past two years due to the wound. In addition to the injections, the patient manages his pain with twice daily morphine. he is currently attempting to reduce his dosage to once daily due to concerns that the medication may be contributing to her erectile dysfunction.patient has also been taking Mounjaro since June and has lost 14 pounds since starting the medication. No new concerns otherwise.  Review of Systems: All other systems reviewed  with pertinent positives and negatives as listed above  Past Medical History:  Diagnosis Date   Abdominal pain 01/25/2021   DDD (degenerative disc disease), lumbar    Diabetes mellitus    Hyperlipemia    Hypertension    Obesity    OSA (obstructive sleep apnea)    on CPAP   Past Surgical History:  Procedure Laterality Date   APPLICATION OF ROBOTIC ASSISTANCE FOR SPINAL PROCEDURE N/A 01/11/2021   Procedure: APPLICATION OF ROBOTIC ASSISTANCE FOR SPINAL PROCEDURE;  Surgeon: Jadene Pierini, MD;  Location: [facility] OR;  Service: Neurosurgery;  Laterality: N/A;   APPLICATION OF WOUND VAC N/A 01/25/2021   Procedure: APPLICATION OF WOUND VAC;  Surgeon: Dawley, Alan Mulder, DO;  Location: [facility] OR;  Service: Neurosurgery;  Laterality: N/A;   BACK SURGERY     COLONOSCOPY  2019   LUMBAR WOUND DEBRIDEMENT N/A 01/25/2021   Procedure: Irrigation and Debridement  of lumbar wound, and wound vacuum assisted closure;  Surgeon: Dawley, Alan Mulder, DO;  Location: [facility] OR;  Service: Neurosurgery;  Laterality: N/A;   TRANSFORAMINAL LUMBAR INTERBODY FUSION (TLIF) WITH PEDICLE SCREW FIXATION 4 LEVEL N/A 01/11/2021   Procedure: Lumbar one-two, Lumbar two-three, Lumbar three-four, Lumbar four-five, Lumbar five Sacral one Open decompression, Transforaminal lumbar interbody fusion, posterolateral instrumented fusion;  Surgeon: Jadene Pierini, MD;  Location: [facility] OR;  Service: Neurosurgery;  Laterality: N/A;    Social History   Tobacco Use   Smoking status: Never   Smokeless tobacco: Never  Vaping Use   Vaping status: Never Used  Substance Use Topics   Alcohol use: No   Drug use: No    Family History  Problem Relation Age of Onset   Diabetes Mother    Hypertension Neg Hx    Coronary artery disease Neg Hx    Colon cancer Neg Hx    Prostate cancer Neg Hx     Allergies  Allergen Reactions   Penicillins Other (See Comments) and Swelling    REACTION: Blister and Sores in the mouth  Blisters in mouth, REACTION: Blister and Sores in the mouth   Duloxetine Hcl Other (See Comments)    Other Reaction(s): GI Intolerance    Health Maintenance  Topic Date Due   OPHTHALMOLOGY EXAM  06/12/2017   COVID-19 Vaccine (5 - 2023-24 season) 12/14/2022   HEMOGLOBIN A1C  07/31/2023   FOOT EXAM  10/29/2023   Diabetic kidney evaluation - eGFR measurement  01/30/2024   Diabetic kidney evaluation - Urine ACR  01/30/2024   Colonoscopy  01/11/2025   DTaP/Tdap/Td (4 - Td or Tdap) 06/02/2032   INFLUENZA VACCINE  Completed   Hepatitis C Screening  Completed   HIV Screening  Completed   Zoster Vaccines- Shingrix  Completed   HPV VACCINES  Aged Out    Objective: BP (!) 132/90   Pulse 100   Temp 97.7 F (36.5 C) (Oral)   Ht 6\' 2"  (1.88 m)   Wt (!) 303 lb (137.4 kg)   SpO2 97%   BMI 38.90 kg/m    Physical Exam Constitutional:      Appearance: Normal appearance.  Morbidly obese  HENT:     Head: Normocephalic and atraumatic.      Mouth: Mucous membranes are moist.  Eyes:    Conjunctiva/sclera: Conjunctivae normal.     Pupils: Pupils are equal, round,  Cardiovascular:     Rate and Rhythm: Normal rate and regular rhythm.     Heart  sounds:   Pulmonary:     Effort: Pulmonary effort is normal.     Breath sounds:   Abdominal:     General:     Palpations: Abdomen is soft.   Musculoskeletal:        General: Normal range of motion.   Skin:    General: Skin is warm and dry.     Comments: Posterior lumbar wound has healed   Neurological:     General: grossly non focal and ambulatory     Mental Status: awake,  alert and oriented to person, place, and time.   Psychiatric:        Mood and Affect: Mood normal.   Lab Results Lab Results  Component Value Date   WBC 8.2 01/30/2023   HGB 15.6 01/30/2023   HCT 47.6 01/30/2023   MCV 90.3 01/30/2023   PLT 264.0 01/30/2023    Lab Results  Component Value Date   CREATININE 1.24 01/30/2023   BUN 18 01/30/2023   NA 138 01/30/2023   K 4.0 01/30/2023   CL 99 01/30/2023   CO2 26 01/30/2023    Lab Results  Component Value Date   ALT 13 10/29/2022   AST 12 10/29/2022   ALKPHOS 78 01/01/2022   BILITOT 0.6 01/01/2022    Lab Results  Component Value Date   CHOL 85 10/29/2022   HDL 37.40 (L) 10/29/2022   LDLCALC 25 10/29/2022   LDLDIRECT 118.0 09/14/2020   TRIG 114.0 10/29/2022   CHOLHDL 2 10/29/2022   No results found for: "LABRPR", "RPRTITER" No results found for: "HIV1RNAQUANT", "HIV1RNAVL", "CD4TABS"  Imaging   Assessment/Plan 25 Y O male with PMH of DM, HLD, HTN, Obesity/OSA and DDD s/p TLIFwith pedicle screw fixation on 9/30  complicated with   # Klebsiella Pneumoniae Lumbar Discitis and osteomyelitis complicated with hardware S/p prolonged IV cefazolin>> 12/29 switched to IV ceftriaxone  until 04/30/21 followed by PO suppression with cefadroxil due to + of hardware CT 3/6  findings noted, no concerns for infection  # Medication Monitoring  10/18 CBC and BMP reviewed and unremarkable   - Continue cefadroxil 500mg  PO BID for PO suppression due to presence of hardware. Medication refilled  - Fu in 4 months, will have completed 2+ years of PO suppression by then  and possibly stop abtx thereafter  comparing risk vs benefit of further continuation of abtx  # Chronic back pain  - controlled  - s/p trigger point injections in the left lumbar paraspinal area  - On  tizanidine norco pen and morphine - Follow with Dr Berline Chough  # Morbid Obesity/DM - congratulated on his weight loss - on medications - Fu with PCP as instructed   I have personally spent 40  minutes involved in face-to-face and non-face-to-face activities for this patient on the day of the visit including coordination of care with other specialist and counseling the patient/family.  Victoriano Lain, MD [facility] 02/05/2023, 8:29 AM

## 2023-02-16 ENCOUNTER — Other Ambulatory Visit: Payer: Self-pay | Admitting: Physical Medicine and Rehabilitation

## 2023-02-16 ENCOUNTER — Other Ambulatory Visit: Payer: Self-pay | Admitting: Internal Medicine

## 2023-02-16 ENCOUNTER — Other Ambulatory Visit (HOSPITAL_COMMUNITY): Payer: Self-pay

## 2023-02-16 ENCOUNTER — Other Ambulatory Visit: Payer: Self-pay

## 2023-02-16 MED ORDER — TIRZEPATIDE 7.5 MG/0.5ML ~~LOC~~ SOAJ
7.5000 mg | SUBCUTANEOUS | 0 refills | Status: DC
  Filled 2023-02-16 – 2023-02-18 (×2): qty 2, 28d supply, fill #0

## 2023-02-17 ENCOUNTER — Other Ambulatory Visit (HOSPITAL_COMMUNITY): Payer: Self-pay

## 2023-02-17 MED ORDER — MORPHINE SULFATE ER 15 MG PO TBCR
15.0000 mg | EXTENDED_RELEASE_TABLET | Freq: Two times a day (BID) | ORAL | 0 refills | Status: DC
  Filled 2023-02-17: qty 60, 30d supply, fill #0

## 2023-02-18 ENCOUNTER — Other Ambulatory Visit (HOSPITAL_COMMUNITY): Payer: Self-pay

## 2023-02-18 ENCOUNTER — Other Ambulatory Visit: Payer: Self-pay

## 2023-03-02 ENCOUNTER — Other Ambulatory Visit: Payer: Self-pay

## 2023-03-02 ENCOUNTER — Other Ambulatory Visit (HOSPITAL_COMMUNITY): Payer: Self-pay

## 2023-03-04 ENCOUNTER — Ambulatory Visit: Payer: 59 | Admitting: Pharmacist

## 2023-03-04 ENCOUNTER — Other Ambulatory Visit (HOSPITAL_COMMUNITY): Payer: Self-pay

## 2023-03-04 DIAGNOSIS — Z7985 Long-term (current) use of injectable non-insulin antidiabetic drugs: Secondary | ICD-10-CM

## 2023-03-04 DIAGNOSIS — E66813 Obesity, class 3: Secondary | ICD-10-CM

## 2023-03-04 DIAGNOSIS — E7800 Pure hypercholesterolemia, unspecified: Secondary | ICD-10-CM

## 2023-03-04 DIAGNOSIS — E1159 Type 2 diabetes mellitus with other circulatory complications: Secondary | ICD-10-CM

## 2023-03-04 DIAGNOSIS — Z6841 Body Mass Index (BMI) 40.0 and over, adult: Secondary | ICD-10-CM

## 2023-03-04 DIAGNOSIS — I10 Essential (primary) hypertension: Secondary | ICD-10-CM

## 2023-03-04 NOTE — Progress Notes (Signed)
03/04/2023 Name: SIDHARTH SHARUM MRN: [id number] DOB: 20-Apr-1965  Chief Complaint  Patient presents with   Diabetes   Medication Management    QUENDARIUS BURROUS is a 57 y.o. year old male who presented for a phone visit for Medication, hyperlipidemia, hypertension and diabetes management.    They were referred to the pharmacist by their PCP for assistance in managing diabetes.   Subjective:  Care Team: Primary Care Provider: Wanda Plump, MD ; Next Scheduled Visit: 05/11/2023  Medication Access/Adherence  Current Pharmacy:  [facility] [address] [place] [address] Phone: [phone] Fax: [phone]  [facility] [address] [place], [place] - [address] [place]. [address] [facility] [address] [place]. [place] [address] Phone: [phone] Fax: [phone]   Patient reports affordability concerns with their medications: No  Patient reports access/transportation concerns to their pharmacy: No  Patient reports adherence concerns with their medications:  No      Diabetes:  Current medications:  Mounjaro 7.5mg  weekly (started 02/25/2023), metformin 1000mg  twice a day.  He has had 1 dose of Mounjaro 7.5mg  - he had nausea and some vomiting about 2 days after the new dose. He skipped this weeks dose because he was worried that he would get nauseated.   Starting weight = 320 lbs Current weight = 303 lbs Starting BMI = 41.13 Current BMI = 38.9 Total weight loss = 17lbs  A1c Feb 2024 increased from 6.2 to 9.2, now A1c 6.0% 01/30/2023.  Renal function has also improved since Mounjaro started.   Pt is engaged with diabetes program with [facility]. Last phone visit was 03/03/2023  Home blood glucose: 80 to 130 Using Freestyle Lite to check blood glucose.  He stopped using Libre 3 sensors because it was giving inconsistent readings.   Wt Readings from Last 3 Encounters:  02/05/23 (!) 303 lb  (137.4 kg)  01/30/23 (!) 305 lb 8 oz (138.6 kg)  01/19/23 (!) 306 lb (138.8 kg)     Mr. Kishi reports acid reflux and indigestion has improved with use of Pepcid Complete as needed.    Patient endorses that he is eating smaller meals and has been more vigilant to when he starts to feel full, to stop eating.  Had increased constipation x 1 day last week. He increased Senna by 2 tablets x 1 days and constipation reslved. He takes Senna 2 tablets twice a day for awhile for opioid inducted constipation. .   Eye Exam - patient states he had eye exam 06/2022 but the optometrist that he saw does not do dilated eye exams for diabetics. He is planning to see another optometrist that does diabetic eye exams in 2025.   Hypertension:  Current therapy: Hyzaar to 50/12.5 mg daily   Current Medication: losartan hydrochlorothiazide 50/12.5mg  daily  Denies dizziness or lightheadedness Does have a blood pressure cuff at home - but only checks about once per week or less.  Reports blood pressure usually 130's / 80's  BP Readings from Last 3 Encounters:  02/05/23 (!) 132/90  01/30/23 138/80  01/19/23 122/83    Hyperlipidemia:  Taking rosuvastatin 20mg  daily - due refill. Looks like refill with [facility] is in process.    Objective:  Lab Results  Component Value Date   HGBA1C 6.0 01/30/2023    Lab Results  Component Value Date   CREATININE 1.24 01/30/2023   BUN 18 01/30/2023  NA 138 01/30/2023   K 4.0 01/30/2023   CL 99 01/30/2023   CO2 26 01/30/2023    Lab Results  Component Value Date   CHOL 85 10/29/2022   HDL 37.40 (L) 10/29/2022   LDLCALC 25 10/29/2022   LDLDIRECT 118.0 09/14/2020   TRIG 114.0 10/29/2022   CHOLHDL 2 10/29/2022    Medications Reviewed Today   Medications were not reviewed in this encounter       Assessment/Plan:   Diabetes / obesity class 3 : Last A1c was at goal and weight has improved with Mounjaro. He is haivng some nausea which might  be related to higher Mounjaro dose.  - Restart Mounjaro 5mg  weekly on Sunday 11/24. If nausea / vomiting returns might need to consider lowering dose back to 5mg  weekly. - Continue Pepcid Complete aas needed for indigestion / acid reflux.  - Continue metformin 1000mg  twice a day.  - Continue to  check blood glucose daily  - Reminded to eat small meals and monitor for when he feels full.  - Continue to follow up with [facility] .  - Reminded patient to schedule annual diabetic eye exam.   Hypertension:  Continue - losartan hydrochlorothiazide 50/12.5mg  daily  Hyperlipidemia: LDL goal < 100 Continue to take rosuvastatin 20mg  daily - refill in process  Medication Adherence:  Discussed adherence and reviewed med list.   Henrene Pastor, PharmD Clinical Pharmacist [facility]

## 2023-03-07 ENCOUNTER — Other Ambulatory Visit: Payer: Self-pay | Admitting: Internal Medicine

## 2023-03-09 ENCOUNTER — Other Ambulatory Visit (HOSPITAL_COMMUNITY): Payer: Self-pay

## 2023-03-09 ENCOUNTER — Other Ambulatory Visit: Payer: Self-pay

## 2023-03-09 MED ORDER — METFORMIN HCL 1000 MG PO TABS
1000.0000 mg | ORAL_TABLET | Freq: Two times a day (BID) | ORAL | 1 refills | Status: DC
  Filled 2023-03-09 (×2): qty 180, 90d supply, fill #0
  Filled 2023-06-02: qty 180, 90d supply, fill #1

## 2023-03-16 ENCOUNTER — Other Ambulatory Visit (HOSPITAL_COMMUNITY): Payer: Self-pay

## 2023-03-16 DIAGNOSIS — Z125 Encounter for screening for malignant neoplasm of prostate: Secondary | ICD-10-CM | POA: Diagnosis not present

## 2023-03-16 DIAGNOSIS — R338 Other retention of urine: Secondary | ICD-10-CM | POA: Diagnosis not present

## 2023-03-16 DIAGNOSIS — N529 Male erectile dysfunction, unspecified: Secondary | ICD-10-CM | POA: Diagnosis not present

## 2023-03-16 LAB — PSA: PSA: 2.61

## 2023-03-16 MED ORDER — TAMSULOSIN HCL 0.4 MG PO CAPS
0.4000 mg | ORAL_CAPSULE | Freq: Every day | ORAL | 5 refills | Status: DC
  Filled 2023-03-16: qty 30, 30d supply, fill #0
  Filled 2023-04-15: qty 30, 30d supply, fill #1
  Filled 2023-05-16: qty 30, 30d supply, fill #2
  Filled 2023-06-18: qty 30, 30d supply, fill #3
  Filled 2023-07-18: qty 30, 30d supply, fill #4
  Filled 2023-08-18: qty 30, 30d supply, fill #5

## 2023-03-24 DIAGNOSIS — N529 Male erectile dysfunction, unspecified: Secondary | ICD-10-CM | POA: Diagnosis not present

## 2023-03-25 ENCOUNTER — Encounter: Payer: Self-pay | Admitting: Internal Medicine

## 2023-03-31 ENCOUNTER — Other Ambulatory Visit: Payer: Self-pay | Admitting: Physical Medicine and Rehabilitation

## 2023-04-01 ENCOUNTER — Other Ambulatory Visit: Payer: Self-pay

## 2023-04-01 ENCOUNTER — Other Ambulatory Visit (HOSPITAL_COMMUNITY): Payer: Self-pay

## 2023-04-01 MED ORDER — MORPHINE SULFATE ER 15 MG PO TBCR
15.0000 mg | EXTENDED_RELEASE_TABLET | Freq: Two times a day (BID) | ORAL | 0 refills | Status: DC
  Filled 2023-04-01: qty 60, 30d supply, fill #0

## 2023-04-01 NOTE — Telephone Encounter (Signed)
Patient requesting refill on morphine last fill per pmp    Filled  Written  ID  Drug  QTY  Days  Prescriber  RX #  Dispenser  Refill  Daily Dose*  Pymt Type  PMP  02/18/2023 02/17/2023 1  Morphine Sulf Er 15 Mg Tablet 60.00 30 Me Lov [id number] Con (0906) 0/0 30.00 MME Other [place]

## 2023-04-16 ENCOUNTER — Other Ambulatory Visit (HOSPITAL_COMMUNITY): Payer: Self-pay

## 2023-04-22 ENCOUNTER — Encounter: Payer: Self-pay | Admitting: Physical Medicine and Rehabilitation

## 2023-04-22 ENCOUNTER — Other Ambulatory Visit (HOSPITAL_COMMUNITY): Payer: Self-pay

## 2023-04-22 ENCOUNTER — Encounter
Payer: Commercial Managed Care - PPO | Attending: Physical Medicine and Rehabilitation | Admitting: Physical Medicine and Rehabilitation

## 2023-04-22 VITALS — BP 132/84 | HR 115 | Ht 74.0 in | Wt 304.0 lb

## 2023-04-22 DIAGNOSIS — Z79891 Long term (current) use of opiate analgesic: Secondary | ICD-10-CM | POA: Diagnosis not present

## 2023-04-22 DIAGNOSIS — Z5181 Encounter for therapeutic drug level monitoring: Secondary | ICD-10-CM | POA: Insufficient documentation

## 2023-04-22 DIAGNOSIS — G8928 Other chronic postprocedural pain: Secondary | ICD-10-CM | POA: Insufficient documentation

## 2023-04-22 DIAGNOSIS — M7918 Myalgia, other site: Secondary | ICD-10-CM | POA: Diagnosis present

## 2023-04-22 MED ORDER — MORPHINE SULFATE ER 15 MG PO TBCR
15.0000 mg | EXTENDED_RELEASE_TABLET | Freq: Two times a day (BID) | ORAL | 0 refills | Status: DC
  Filled 2023-04-22 – 2023-06-03 (×3): qty 60, 30d supply, fill #0

## 2023-04-22 MED ORDER — LIDOCAINE HCL 1 % IJ SOLN
3.0000 mL | Freq: Once | INTRAMUSCULAR | Status: AC
  Administered 2023-04-22: 3 mL

## 2023-04-22 MED ORDER — MORPHINE SULFATE ER 15 MG PO TBCR
15.0000 mg | EXTENDED_RELEASE_TABLET | Freq: Two times a day (BID) | ORAL | 0 refills | Status: DC
  Filled 2023-04-22 – 2023-05-04 (×2): qty 60, 30d supply, fill #0

## 2023-04-22 NOTE — Progress Notes (Signed)
 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  Also has DM- last A1c 6.2.      And for trigger point injections.          Gets a shot- Epidural steroid injection? On 04/29/23 for screw that's loose to tolerate pain. .   Last time feet and legs went numb last time for 2 minutes-  and hurt more in LEs, when moved a certain way, gets sharp pain.   Hasn't been taking Zanaflex  lately- so doesn't need refills.    Urology added Flomax  0.4 mg nightly-  No lightheadedness with Flomax .    Muscle tightness in back starting to act up again- knot coming back.   Between NSU   Lost another 5 lbs- off monjaro now since made ill- so feeling good about it. Was vomiting- at least 3 days/week on increased dose, so stopped it. Following same routine now.  Down to 304 lbs today from 309  1 month ago.   Sees PCP end of month- and sees ID at early February.    Plan: UDS due today- per clinic guidelines- since on MS Contin    2.  Will refill MS Contin  15 mg BID- one for this month and one for next month.    3. Patient here for trigger point injections for  Consent done and on chart.  Cleaned areas with alcohol and injected using a 27 gauge 1.5 inch needle  Injected  3 cc- none wasted Using 1% Lidocaine  with no EPI  Upper traps Levators Posterior scalenes Middle scalenes Splenius Capitus Pectoralis Major Rhomboids Infraspinatus Teres Major/minor Thoracic paraspinals Lumbar paraspinals- x3 and dry needled as well  Other injections-    Patient's level of pain prior was Current level of pain after injections is  There was no bleeding or complications.  Patient was advised to drink a lot of water on day  after injections to flush system Will have increased soreness for 12-48 hours after injections.  Can use Lidocaine  patches the day AFTER injections Can use theracane on day of injections in places didn't inject Can use heating pad 4-6 hours AFTER injections  4. Off Mounjaro  right now- tolerated lower dose, but not higher dose.  Was vomiting on it.    5. F/U- wait list for 6 week appt- and then make for 3 months and then f/u q6 weeks. For trP injections and f/u on chronic pain   I spent a total of  21  minutes on total care today- >50% coordination of care- due to trP injections were 6 minutes- rest d/w pt about pain meds- UDS and how to do f/u's

## 2023-04-22 NOTE — Patient Instructions (Signed)
 Plan: UDS due today- per clinic guidelines- since on MS Contin    2.  Will refill MS Contin  15 mg BID- one for this month and one for next month.    3. Patient here for trigger point injections for  Consent done and on chart.  Cleaned areas with alcohol and injected using a 27 gauge 1.5 inch needle  Injected  3 cc- none wasted Using 1% Lidocaine  with no EPI  Upper traps Levators Posterior scalenes Middle scalenes Splenius Capitus Pectoralis Major Rhomboids Infraspinatus Teres Major/minor Thoracic paraspinals Lumbar paraspinals- x3 and dry needled as well  Other injections-    Patient's level of pain prior was Current level of pain after injections is  There was no bleeding or complications.  Patient was advised to drink a lot of water on day after injections to flush system Will have increased soreness for 12-48 hours after injections.  Can use Lidocaine  patches the day AFTER injections Can use theracane on day of injections in places didn't inject Can use heating pad 4-6 hours AFTER injections  4. Off Mounjaro  right now- tolerated lower dose, but not higher dose.  Was vomiting on it.    5. F/U- wait list for 6 week appt- and then make for 3 months and then f/u q6 weeks. For trP injections and f/u on chronic pain

## 2023-04-26 LAB — TOXASSURE SELECT,+ANTIDEPR,UR

## 2023-04-29 DIAGNOSIS — M5416 Radiculopathy, lumbar region: Secondary | ICD-10-CM | POA: Diagnosis not present

## 2023-04-30 ENCOUNTER — Other Ambulatory Visit (HOSPITAL_COMMUNITY): Payer: Self-pay

## 2023-05-04 ENCOUNTER — Other Ambulatory Visit: Payer: Self-pay

## 2023-05-04 ENCOUNTER — Other Ambulatory Visit (HOSPITAL_COMMUNITY): Payer: Self-pay

## 2023-05-05 ENCOUNTER — Other Ambulatory Visit (HOSPITAL_COMMUNITY): Payer: Self-pay

## 2023-05-05 ENCOUNTER — Encounter: Payer: Self-pay | Admitting: Internal Medicine

## 2023-05-05 ENCOUNTER — Ambulatory Visit (INDEPENDENT_AMBULATORY_CARE_PROVIDER_SITE_OTHER): Payer: Commercial Managed Care - PPO | Admitting: Internal Medicine

## 2023-05-05 VITALS — BP 134/82 | HR 90 | Temp 97.7°F | Resp 18 | Ht 74.0 in | Wt 302.0 lb

## 2023-05-05 DIAGNOSIS — E1159 Type 2 diabetes mellitus with other circulatory complications: Secondary | ICD-10-CM

## 2023-05-05 DIAGNOSIS — E7800 Pure hypercholesterolemia, unspecified: Secondary | ICD-10-CM

## 2023-05-05 DIAGNOSIS — M4646 Discitis, unspecified, lumbar region: Secondary | ICD-10-CM | POA: Diagnosis not present

## 2023-05-05 DIAGNOSIS — Z7984 Long term (current) use of oral hypoglycemic drugs: Secondary | ICD-10-CM | POA: Diagnosis not present

## 2023-05-05 LAB — CBC WITH DIFFERENTIAL/PLATELET
Basophils Absolute: 0 10*3/uL (ref 0.0–0.1)
Basophils Relative: 0.4 % (ref 0.0–3.0)
Eosinophils Absolute: 0.1 10*3/uL (ref 0.0–0.7)
Eosinophils Relative: 1.4 % (ref 0.0–5.0)
HCT: 44.3 % (ref 39.0–52.0)
Hemoglobin: 14.9 g/dL (ref 13.0–17.0)
Lymphocytes Relative: 10.3 % — ABNORMAL LOW (ref 12.0–46.0)
Lymphs Abs: 1.1 10*3/uL (ref 0.7–4.0)
MCHC: 33.6 g/dL (ref 30.0–36.0)
MCV: 91.4 fL (ref 78.0–100.0)
Monocytes Absolute: 0.5 10*3/uL (ref 0.1–1.0)
Monocytes Relative: 4.9 % (ref 3.0–12.0)
Neutro Abs: 8.6 10*3/uL — ABNORMAL HIGH (ref 1.4–7.7)
Neutrophils Relative %: 83 % — ABNORMAL HIGH (ref 43.0–77.0)
Platelets: 266 10*3/uL (ref 150.0–400.0)
RBC: 4.85 Mil/uL (ref 4.22–5.81)
RDW: 14 % (ref 11.5–15.5)
WBC: 10.4 10*3/uL (ref 4.0–10.5)

## 2023-05-05 LAB — BASIC METABOLIC PANEL
BUN: 23 mg/dL (ref 6–23)
CO2: 25 meq/L (ref 19–32)
Calcium: 9.4 mg/dL (ref 8.4–10.5)
Chloride: 103 meq/L (ref 96–112)
Creatinine, Ser: 1.19 mg/dL (ref 0.40–1.50)
GFR: 66.71 mL/min (ref >60.00)
Glucose, Bld: 151 mg/dL — ABNORMAL HIGH (ref 70–99)
Potassium: 4.3 meq/L (ref 3.5–5.1)
Sodium: 138 meq/L (ref 135–145)

## 2023-05-05 LAB — HEMOGLOBIN A1C: Hgb A1c MFr Bld: 6 % (ref 4.6–6.5)

## 2023-05-05 MED ORDER — TIRZEPATIDE 5 MG/0.5ML ~~LOC~~ SOAJ
5.0000 mg | SUBCUTANEOUS | 0 refills | Status: DC
  Filled 2023-05-05: qty 2, 28d supply, fill #0

## 2023-05-05 NOTE — Progress Notes (Unsigned)
Subjective:    Patient ID: [name], male    DOB: [birth year], 58 y.o.   MRN: [id number]  DOS:  05/05/2023 Type of visit - description: f/u  Routine follow-up. Today we talk about DM, morbid obesity. Recently saw ID for discitis, still on antibiotics. Denies fever chills Wt Readings from Last 3 Encounters:  05/05/23 (!) 302 lb (137 kg)  04/22/23 (!) 304 lb (137.9 kg)  02/05/23 (!) 303 lb (137.4 kg)      Review of Systems See above   Past Medical History:  Diagnosis Date   Abdominal pain 01/25/2021   DDD (degenerative disc disease), lumbar    Diabetes mellitus    Hyperlipemia    Hypertension    Obesity    OSA (obstructive sleep apnea)    on CPAP    Past Surgical History:  Procedure Laterality Date   APPLICATION OF ROBOTIC ASSISTANCE FOR SPINAL PROCEDURE N/A 01/11/2021   Procedure: APPLICATION OF ROBOTIC ASSISTANCE FOR SPINAL PROCEDURE;  Surgeon: Jadene Pierini, MD;  Location: [facility];  Service: Neurosurgery;  Laterality: N/A;   APPLICATION OF WOUND VAC N/A 01/25/2021   Procedure: APPLICATION OF WOUND VAC;  Surgeon: Dawley, Alan Mulder, DO;  Location: [facility];  Service: Neurosurgery;  Laterality: N/A;   BACK SURGERY     COLONOSCOPY  2019   LUMBAR WOUND DEBRIDEMENT N/A 01/25/2021   Procedure: Irrigation and Debridement of lumbar wound, and wound vacuum assisted closure;  Surgeon: Dawley, Alan Mulder, DO;  Location: [facility];  Service: Neurosurgery;  Laterality: N/A;   TRANSFORAMINAL LUMBAR INTERBODY FUSION (TLIF) WITH PEDICLE SCREW FIXATION 4 LEVEL N/A 01/11/2021   Procedure: Lumbar one-two, Lumbar two-three, Lumbar three-four, Lumbar four-five, Lumbar five Sacral one Open decompression, Transforaminal lumbar interbody fusion, posterolateral instrumented fusion;  Surgeon: Jadene Pierini, MD;  Location: [facility];  Service: Neurosurgery;  Laterality: N/A;    Current Outpatient Medications  Medication Instructions   ascorbic acid (VITAMIN C) 500 mg, Oral, 2 times daily   Blood  Glucose Monitoring Suppl (FREESTYLE LITE) w/Device KIT Use to check blood sugar up to 4 times daily as directed   cefadroxil (DURICEF) 500 mg, Oral, 2 times daily   cholecalciferol (VITAMIN D3) 1,000 Units, Daily   famotidine-calcium carbonate-magnesium hydroxide (PEPCID COMPLETE) 10-800-165 MG chewable tablet 1 tablet, Daily PRN   fluticasone (FLONASE) 50 MCG/ACT nasal spray 2 sprays, Daily PRN   glucose blood (FREESTYLE LITE) test strip Use 1 strip to check blood sugar up to 4 times daily as directed   glucose blood test strip Use to check blood sugar 4 (four) times daily as directed   Lancets (FREESTYLE) lancets Use to check blood sugar up to 4 (four) times daily as directed   losartan-hydrochlorothiazide (HYZAAR) 50-12.5 MG tablet 1 tablet, Oral, Daily   metFORMIN (GLUCOPHAGE) 1,000 mg, Oral, 2 times daily with meals   [START ON 05/27/2023] morphine (MS CONTIN) 15 MG 12 hr tablet Take 1 tablet (15 mg total) by mouth every 12 (twelve) hours   morphine (MS CONTIN) 15 mg, Oral, Every 12 hours   Mounjaro 7.5 mg, Subcutaneous, Weekly   naloxone (NARCAN) nasal spray 4 mg/0.1 mL Use as directed   polyethylene glycol (MIRALAX / GLYCOLAX) 17 g, Oral, 2 times daily   rosuvastatin (CRESTOR) 20 mg, Oral, Daily   senna (SENOKOT) 25.8 mg, Oral, 2 times daily   simethicone (MYLICON) 80 mg, Oral, 4 times daily PRN   tadalafil (CIALIS) 10-20 mg, Oral, Every 48 hours PRN   tamsulosin (  FLOMAX) 0.4 mg, Oral, Daily at bedtime   tiZANidine (ZANAFLEX) 4 mg, Oral, 2 times daily   Zinc Sulfate 220 mg, Oral, Daily       Objective:   Physical Exam BP 134/82   Pulse 90   Temp 97.7 F (36.5 C) (Oral)   Resp 18   Ht 6\' 2"  (1.88 m)   Wt (!) 302 lb (137 kg)   SpO2 96%   BMI 38.77 kg/m  General:   Well developed, NAD, BMI noted. HEENT:  Normocephalic . Face symmetric, atraumatic Lungs:  CTA B Normal respiratory effort, no intercostal retractions, no accessory muscle use. Heart: RRR,  no murmur.  Lower  extremities: no pretibial edema bilaterally  Skin: Not pale. Not jaundice Neurologic:  alert & oriented X3.  Speech normal, gait appropriate for age and unassisted Psych--  Cognition and judgment appear intact.  Cooperative with normal attention span and concentration.  Behavior appropriate. No anxious or depressed appearing.      Assessment     Assessment DM NO neuropathy per [facility] 07-2020.  SX related to back DJD HTN Hyperlipidemia MSK: -DJD Knees - back (severe per MRI, + radiation B legs) - Back surgery, 2022---discitis, lumbar osteomyelitis -Chronic neck pain Morbid obesity OSA  (severe per repeated study April 2022), off  CPAP since ~ 02-2021 d/t we loss Diastases recti CAD: Mildly elevated coronary calcium score, f/u nuclear study (-), medical therapy  PLAN BMP CBC A1c DM: On metformin, self.  Mounjaro 7.5 mg because nausea.  Nevertheless, in the last 2 to 3 weeks he has been losing weight. Does not check ambulatory CBGs. Plan: Check A1c, decrease Mounjaro to 5 mg weekly. HTN: Ambulatory BPs in the 120s, continue Hyzaar, check a BMP and CBC. Discitis: On suppressive antibiotics, next follow-up with ID in few weeks. RTC 4 months   10 DM: Currently on metformin and Mounjaro 2.5 mg, plans is to step up Mounjaro to 5 mg weekly.  + Weight loss noted.  CBGs typically in the 120s.  Encouraged healthy diet.  Check A1c and micro. HTN:On Hyzaar, BP looks good, checking labs. Discitis, lumbar osteomyelitis: On chronic ABX, to see ID soon.  Check a BMP and CBC OSA: Currently not on CPAP due to weight loss in previous years.  Denies snoring or lack of energy Vitamin D: History of deficiency, on OTCs, checking labs GERD: Started with some symptoms, well-controlled with Pepcid Complete OTC.  Reassess on RTC Preventive care: Flu shot today. RTC 3 months CPX

## 2023-05-05 NOTE — Patient Instructions (Addendum)
Vaccines I recommend; Covid booster if not done recently   Check the  blood pressure regularly Blood pressure goal:  between 110/65 and  135/85. If it is consistently higher or lower, let me know     GO TO THE LAB : Get the blood work     Next visit with me in 4 months for a routine checkup Please schedule it at the front desk      Per our records you are due for your diabetic eye exam. Please contact your eye doctor to schedule an appointment. Please have them send copies of your office visit notes to [name]. Our fax number is [phone]. If you need a referral to an eye doctor please let us know.

## 2023-05-06 ENCOUNTER — Telehealth: Payer: Self-pay

## 2023-05-06 ENCOUNTER — Encounter: Payer: Self-pay | Admitting: Infectious Diseases

## 2023-05-06 ENCOUNTER — Encounter: Payer: Self-pay | Admitting: Internal Medicine

## 2023-05-06 ENCOUNTER — Other Ambulatory Visit (HOSPITAL_COMMUNITY): Payer: Self-pay

## 2023-05-06 NOTE — Assessment & Plan Note (Addendum)
DM: On metformin,   Mounjaro causing some  nausea.  Nevertheless, in the last 2 to 3 weeks he has been losing weight. Does not check ambulatory CBGs. Plan: Check A1c, decrease Mounjaro to 5 mg weekly. HTN: Ambulatory BPs in the 120s, continue Hyzaar, check a BMP and CBC. Discitis: On suppressive antibiotics, next follow-up with ID in few weeks. Needs BMP CBC RTC 4 months

## 2023-05-06 NOTE — Telephone Encounter (Signed)
PA initiated via Covermymeds; KEY: [id number].  Member should be able to get the drug/product without a PA at this time.

## 2023-05-11 ENCOUNTER — Ambulatory Visit: Payer: 59 | Admitting: Internal Medicine

## 2023-05-14 ENCOUNTER — Other Ambulatory Visit (HOSPITAL_COMMUNITY): Payer: Self-pay

## 2023-06-02 ENCOUNTER — Other Ambulatory Visit (HOSPITAL_COMMUNITY): Payer: Self-pay

## 2023-06-02 ENCOUNTER — Other Ambulatory Visit: Payer: Self-pay | Admitting: Internal Medicine

## 2023-06-02 ENCOUNTER — Other Ambulatory Visit: Payer: Self-pay

## 2023-06-02 MED ORDER — LOSARTAN POTASSIUM-HCTZ 50-12.5 MG PO TABS
1.0000 | ORAL_TABLET | Freq: Every day | ORAL | 1 refills | Status: DC
  Filled 2023-06-02: qty 90, 90d supply, fill #0
  Filled 2023-07-18 – 2023-09-03 (×2): qty 90, 90d supply, fill #1

## 2023-06-03 ENCOUNTER — Encounter
Payer: Commercial Managed Care - PPO | Attending: Physical Medicine and Rehabilitation | Admitting: Physical Medicine and Rehabilitation

## 2023-06-03 ENCOUNTER — Encounter: Payer: Self-pay | Admitting: Physical Medicine and Rehabilitation

## 2023-06-03 ENCOUNTER — Other Ambulatory Visit (HOSPITAL_COMMUNITY): Payer: Self-pay

## 2023-06-03 VITALS — BP 115/81 | HR 102 | Ht 74.0 in | Wt 302.0 lb

## 2023-06-03 DIAGNOSIS — M7918 Myalgia, other site: Secondary | ICD-10-CM | POA: Diagnosis not present

## 2023-06-03 DIAGNOSIS — M5416 Radiculopathy, lumbar region: Secondary | ICD-10-CM | POA: Diagnosis not present

## 2023-06-03 DIAGNOSIS — T847XXS Infection and inflammatory reaction due to other internal orthopedic prosthetic devices, implants and grafts, sequela: Secondary | ICD-10-CM | POA: Insufficient documentation

## 2023-06-03 MED ORDER — MORPHINE SULFATE ER 15 MG PO TBCR
15.0000 mg | EXTENDED_RELEASE_TABLET | Freq: Two times a day (BID) | ORAL | 0 refills | Status: DC
  Filled 2023-06-03 – 2023-07-18 (×2): qty 60, 30d supply, fill #0

## 2023-06-03 MED ORDER — LIDOCAINE HCL 1 % IJ SOLN
6.0000 mL | Freq: Once | INTRAMUSCULAR | Status: AC
  Administered 2023-06-03: 6 mL

## 2023-06-03 MED ORDER — MORPHINE SULFATE ER 15 MG PO TBCR
15.0000 mg | EXTENDED_RELEASE_TABLET | Freq: Two times a day (BID) | ORAL | 0 refills | Status: DC
  Filled 2023-06-03 – 2023-07-03 (×2): qty 60, 30d supply, fill #0

## 2023-06-03 NOTE — Patient Instructions (Signed)
Plan: Will refill MS Contin 15 mg BID- for chronic pain in back since cannot get surgery again for back issues and loose screw. Picking last Rx today and will send in MS Contin 15 mg BID for 3/18 and 4/15 to be filled.   2. Patient here for trigger point injections for  Consent done and on chart.  Cleaned areas with alcohol and injected using a 27 gauge 1.5 inch needle  Injected  5cc- wasted 1cc Using 1% Lidocaine with no EPI  Upper traps-  Levators Posterior scalenes Middle scalenes Splenius Capitus Pectoralis Major Rhomboids Infraspinatus Teres Major/minor Thoracic paraspinals Lumbar paraspinals On L side x4 and R side x3- really broke up the trigger points with a lot of needling.  Other injections-    Patient's level of pain prior was  8/10 Current level of pain after injections is 3/10 There was no bleeding or complications.  Patient was advised to drink a lot of water on day after injections to flush system Will have increased soreness for 12-48 hours after injections.  Can use Lidocaine patches the day AFTER injections Can use theracane on day of injections in places didn't inject Can use heating pad 4-6 hours AFTER injections  3. Not using Zanaflex anymore, so doesn't need refill- not working for him at all.   4.  I suggest tennis ball- to keep muscle from trigger point looser  and prolong time til pain returns-  more preventative than treatment after it gets tight-  2-4 minutes on each muscle spasm/trigger point.    5. F/U- with f/u in 6 weeks- and f/u on pain and trigger point injections if needed

## 2023-06-03 NOTE — Progress Notes (Signed)
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  Also has DM- last A1c 6.2.      And for trigger point injections.               ESI went well in January-04/29/23-  didn't make legs go numb this past time-  still working wel.   Goes back in April- can have 4x/year.   Knot still there and now one knot or more has shown up on R side .Hadn't acted up in a long time.  L side has gotten bigger- size of golf ball- the trigger point and R side  size of golf ball as well, but slightly less tender.    Plan: Will refill MS Contin 15 mg BID- for chronic pain in back since cannot get surgery again for back issues and loose screw. Picking last Rx today and will send in MS Contin 15 mg BID for 3/18 and 4/15 to be filled.   2. Patient here for trigger point injections for  Consent done and on chart.  Cleaned areas with alcohol and injected using a 27 gauge 1.5 inch needle  Injected  5cc- wasted 1cc Using 1% Lidocaine with no EPI  Upper traps-  Levators Posterior scalenes Middle scalenes Splenius Capitus Pectoralis Major Rhomboids Infraspinatus Teres Major/minor Thoracic paraspinals Lumbar paraspinals On L side x4 and R side x3- really broke up the trigger points with a lot of needling.  Other injections-    Patient's level of pain prior was  8/10 Current level of pain after injections is 3/10 There was no bleeding or complications.  Patient was advised to drink a lot of water on day after injections to flush system Will have increased soreness for 12-48 hours after injections.  Can use Lidocaine patches the day AFTER injections Can use theracane on day of injections in places didn't inject Can use  heating pad 4-6 hours AFTER injections  3. Not using Zanaflex anymore, so doesn't need refill- not working for him at all.   4.  I suggest tennis ball- to keep muscle from trigger point looser  and prolong time til pain returns-  more preventative than treatment after it gets tight-  2-4 minutes on each muscle spasm/trigger point.  Demonstrated how to do  5. F/U- with f/u in 6 weeks- and f/u on pain and trigger point injections if needed   I spent a total of 26   minutes on total care today- >50% coordination of care- due to 6 minute son injections and f/u on pain and d/w pt on how to prevent trP 's.

## 2023-06-09 ENCOUNTER — Ambulatory Visit: Payer: 59 | Admitting: Infectious Diseases

## 2023-06-11 ENCOUNTER — Ambulatory Visit: Payer: 59 | Admitting: Infectious Diseases

## 2023-06-11 ENCOUNTER — Other Ambulatory Visit: Payer: Self-pay

## 2023-06-18 ENCOUNTER — Ambulatory Visit: Payer: Commercial Managed Care - PPO | Admitting: Infectious Diseases

## 2023-06-18 ENCOUNTER — Other Ambulatory Visit (HOSPITAL_COMMUNITY): Payer: Self-pay

## 2023-06-18 ENCOUNTER — Encounter: Payer: Self-pay | Admitting: Infectious Diseases

## 2023-06-18 ENCOUNTER — Other Ambulatory Visit: Payer: Self-pay

## 2023-06-18 VITALS — BP 133/87 | HR 101 | Temp 97.3°F | Wt 306.0 lb

## 2023-06-18 DIAGNOSIS — Z6839 Body mass index (BMI) 39.0-39.9, adult: Secondary | ICD-10-CM | POA: Diagnosis not present

## 2023-06-18 DIAGNOSIS — B961 Klebsiella pneumoniae [K. pneumoniae] as the cause of diseases classified elsewhere: Secondary | ICD-10-CM | POA: Diagnosis not present

## 2023-06-18 DIAGNOSIS — T847XXD Infection and inflammatory reaction due to other internal orthopedic prosthetic devices, implants and grafts, subsequent encounter: Secondary | ICD-10-CM

## 2023-06-18 DIAGNOSIS — M4626 Osteomyelitis of vertebra, lumbar region: Secondary | ICD-10-CM

## 2023-06-18 DIAGNOSIS — M4646 Discitis, unspecified, lumbar region: Secondary | ICD-10-CM

## 2023-06-18 DIAGNOSIS — Z5181 Encounter for therapeutic drug level monitoring: Secondary | ICD-10-CM | POA: Insufficient documentation

## 2023-06-18 MED ORDER — CEFADROXIL 500 MG PO CAPS
500.0000 mg | ORAL_CAPSULE | Freq: Two times a day (BID) | ORAL | 5 refills | Status: DC
  Filled 2023-06-18 – 2023-07-02 (×2): qty 60, 30d supply, fill #0
  Filled 2023-07-18 – 2023-08-03 (×2): qty 60, 30d supply, fill #1
  Filled 2023-08-27: qty 60, 30d supply, fill #2
  Filled 2023-10-06: qty 60, 30d supply, fill #3

## 2023-06-18 NOTE — Progress Notes (Signed)
 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   Klebsiella pneumoniae infection 01/28/2021   Wound infection after surgery 01/25/2021   Muscle spasms of both lower extremities 01/25/2021   Spleen enlarged 01/25/2021   Lumbar radiculopathy 01/11/2021   Neuropathy 09/28/2020   Chronic pain syndrome 09/28/2020   Intervertebral lumbar disc disorder with myelopathy, lumbar region 09/28/2020   Spondylosis, cervical, with myelopathy 09/28/2020   PCP NOTES >>>>>>>> 07/09/2015   DJD (degenerative joint disease) 10/27/2014   OSA (obstructive sleep apnea) 06/14/2012   ED (erectile dysfunction) 04/26/2012   Annual physical exam 12/29/2011   Achilles tendinitis 09/10/2010   Allergic rhinitis 07/12/2010   Hyperlipidemia 04/26/2007   DM II (diabetes mellitus, type II), controlled (HCC) 04/22/2006   Essential hypertension 04/22/2006   Current Outpatient Medications on File Prior to Visit  Medication Sig Dispense Refill   ascorbic acid (VITAMIN C) 500 MG tablet Take 1 tablet (500 mg total) by mouth 2 (two) times daily. (Patient taking differently: Take 500 mg by mouth daily.) 100 tablet 3   Blood Glucose Monitoring Suppl (FREESTYLE LITE) w/Device KIT Use to check blood sugar up to 4 times daily as directed 1 kit 0   cholecalciferol (VITAMIN D3) 25 MCG (1000 UNIT) tablet Take 1,000 Units by mouth daily.     famotidine-calcium carbonate-magnesium hydroxide (PEPCID COMPLETE) 10-800-165 MG chewable tablet Chew 1 tablet by mouth daily  as needed.     fluticasone (FLONASE) 50 MCG/ACT nasal spray Place 2 sprays into both nostrils daily as needed.     glucose blood (FREESTYLE LITE) test strip Use 1 strip to check blood sugar up to 4 times daily as directed 200 each 0   glucose blood test strip Use to check blood sugar 4 (four) times daily as directed 200 each 0   Lancets (FREESTYLE) lancets Use to check blood sugar up to 4 (four) times daily as directed 200 each 0   losartan-hydrochlorothiazide (HYZAAR) 50-12.5 MG tablet Take 1 tablet by mouth daily. 90 tablet 1   metFORMIN (GLUCOPHAGE) 1000 MG tablet Take 1 tablet (1,000 mg total) by mouth 2 (two) times daily with a meal. 180 tablet 1   morphine (MS CONTIN) 15 MG 12 hr tablet Take 1 tablet (15 mg total) by mouth every 12 (twelve) hours 60 tablet 0   morphine (MS CONTIN) 15 MG 12 hr tablet Take 1 tablet (15 mg total) by mouth every 12 (twelve) hours. Fill 07/28/23 for chronic pain 60 tablet 0   morphine (MS CONTIN) 15 MG 12 hr tablet Take 1 tablet (15 mg total) by mouth every 12 (twelve) hours for chronic pain. (06/30/23) 60 tablet 0   polyethylene glycol (MIRALAX / GLYCOLAX) 17 g packet Take 17 g by mouth 2 (two) times daily. 14 each 0   rosuvastatin (CRESTOR) 20 MG tablet Take 1 tablet (20 mg total) by mouth daily. 90 tablet 3   senna (SENOKOT) 8.6 MG TABS tablet Take 3 tablets (25.8 mg total) by mouth 2 (two) times daily. 270 tablet 0  simethicone (MYLICON) 80 MG chewable tablet Chew 1 tablet (80 mg total) by mouth 4 (four) times daily as needed for flatulence. 60 tablet 0   tadalafil (CIALIS) 20 MG tablet Take 0.5-1 tablets (10-20 mg total) by mouth every other day as needed for erectile dysfunction. 30 tablet 3   tamsulosin (FLOMAX) 0.4 MG CAPS capsule Take 1 capsule (0.4 mg total) by mouth at bedtime. 30 capsule 5   tirzepatide (MOUNJARO) 5 MG/0.5ML Pen Inject 5 mg into the skin once a week. 2 mL 0   tiZANidine (ZANAFLEX) 4 MG tablet Take 1 tablet (4 mg total) by mouth 2 (two)  times daily. (Patient taking differently: Take 4 mg by mouth in the morning and at bedtime. AS NEEDED) 60 tablet 5   Zinc Sulfate 220 (50 Zn) MG TABS Take 1 tablet (220 mg total) by mouth daily. 100 tablet 5   naloxone (NARCAN) nasal spray 4 mg/0.1 mL Use as directed (Patient not taking: Reported on 05/05/2023) 2 each 1   No current facility-administered medications on file prior to visit.    Subjective: [name] is a 58 Year old male with PMH of DM, HLD, HTN, Obesity/OSA and DDD s/p recent TLIFwith pedicel screw fixation on 9/30 complicated by Kleb pneumo bacteremia post operatively concerning for surgical site infection following 10/14 debridement of lumbar wound, concerning for deep lumbar wound infection, planned for 6 weeks IV cefazolin, however course complicated by worsening back pain and increased inflammatory markers. MRI was repeated 10/29 osteomyelitis discitis at L1-2 with associated small amount of epidural phlegmon and/or abscess within the left ventral epidural space extending from L1-2 to approximately L2-3/Associated paraspinous edema/phlegmon within the adjacent left greater than right psoas musculature, with superimposed 1 cm left psoas soft tissue abscess.  Neurosurgery did not feel another surgery was indicated and recommended IR consult for drainage of left psoas abscess. IR felt the findings in the left psoas was more of oedema than an abscess and was not drained. ID recommended repeat MRI in 2 weeks. Repeat MRI 11/23 with overall similar findings, left psoas abscess ( 1.2cm , previously 0.9cm), Additional enhancing fluid collection at the anterior left aspect of the L1-L2 disc space measures approximately 1.6 x 3.1 x 3.2 cm likely an additional focus of abscess. Small amount of phlegmon within the left ventral epidural space L2-L3. MRI findings discussed with Dr Daiva Eves and recommended IV abtx for 4 weeks with ID follow up. Patient discharged from the hospital 03/08/21.   IV  cefazolin was switched to IV ceftriaxone 04/11/2021 in the setting of  hardware loosening and erosive changes seen in the recent CT mechanical failure or infection related while on prolonged IV cefazolin course. IV ceftriaxone was continued until 04/30/21 followed by PO suppression with cefadroxil due to + of hardware which he has been consistently taking so far  He has been following closely with pain medicine for pain management as well as neurosurgery.  Lumbar back wound has healed   06/18/23 Taking cefadroxil, no concerns with abtx or missed doses. Stopped taking Moujarao recently due to vomiting. Lost 35 lbs while taking mounjaro. Discussed about continuing antibiotics vs trialing off abtx as he has been more than 2 years on PO suppression but possible riskof recurrence of infection to which he said he does not want to get another back surgery if in case infection recurs and prefers to be on antibiotics forever as long he does not have issues. No new concerns otherwise.   Review of Systems:  All other systems reviewed  with pertinent positives and negatives as listed above  Past Medical History:  Diagnosis Date   Abdominal pain 01/25/2021   DDD (degenerative disc disease), lumbar    Diabetes mellitus    Hyperlipemia    Hypertension    Obesity    OSA (obstructive sleep apnea)    on CPAP   Past Surgical History:  Procedure Laterality Date   APPLICATION OF ROBOTIC ASSISTANCE FOR SPINAL PROCEDURE N/A 01/11/2021   Procedure: APPLICATION OF ROBOTIC ASSISTANCE FOR SPINAL PROCEDURE;  Surgeon: Jadene Pierini, MD;  Location: [facility];  Service: Neurosurgery;  Laterality: N/A;   APPLICATION OF WOUND VAC N/A 01/25/2021   Procedure: APPLICATION OF WOUND VAC;  Surgeon: Dawley, Alan Mulder, DO;  Location: [facility];  Service: Neurosurgery;  Laterality: N/A;   BACK SURGERY     COLONOSCOPY  2019   LUMBAR WOUND DEBRIDEMENT N/A 01/25/2021   Procedure: Irrigation and Debridement of lumbar wound, and wound vacuum  assisted closure;  Surgeon: Dawley, Alan Mulder, DO;  Location: [facility];  Service: Neurosurgery;  Laterality: N/A;   TRANSFORAMINAL LUMBAR INTERBODY FUSION (TLIF) WITH PEDICLE SCREW FIXATION 4 LEVEL N/A 01/11/2021   Procedure: Lumbar one-two, Lumbar two-three, Lumbar three-four, Lumbar four-five, Lumbar five Sacral one Open decompression, Transforaminal lumbar interbody fusion, posterolateral instrumented fusion;  Surgeon: Jadene Pierini, MD;  Location: [facility];  Service: Neurosurgery;  Laterality: N/A;    Social History   Tobacco Use   Smoking status: Never   Smokeless tobacco: Never  Vaping Use   Vaping status: Never Used  Substance Use Topics   Alcohol use: No   Drug use: No    Family History  Problem Relation Age of Onset   Diabetes Mother    Hypertension Neg Hx    Coronary artery disease Neg Hx    Colon cancer Neg Hx    Prostate cancer Neg Hx     Allergies  Allergen Reactions   Penicillins Other (See Comments) and Swelling    REACTION: Blister and Sores in the mouth  Blisters in mouth, REACTION: Blister and Sores in the mouth   Duloxetine Hcl Other (See Comments)    Other Reaction(s): GI Intolerance    Health Maintenance  Topic Date Due   Pneumococcal Vaccine 78-90 Years old (2 of 2 - PCV) 10/27/2015   COVID-19 Vaccine (5 - 2024-25 season) 12/14/2022   FOOT EXAM  10/29/2023   HEMOGLOBIN A1C  11/02/2023   Diabetic kidney evaluation - Urine ACR  01/30/2024   Diabetic kidney evaluation - eGFR measurement  05/04/2024   OPHTHALMOLOGY EXAM  05/04/2024   Colonoscopy  01/11/2025   DTaP/Tdap/Td (4 - Td or Tdap) 06/02/2032   INFLUENZA VACCINE  Completed   Hepatitis C Screening  Completed   HIV Screening  Completed   Zoster Vaccines- Shingrix  Completed   HPV VACCINES  Aged Out    Objective: BP 133/87   Pulse (!) 101   Temp (!) 97.3 F (36.3 C) (Oral)   Wt (!) 306 lb (138.8 kg)   SpO2 97%   BMI 39.29 kg/m    Physical Exam Constitutional:      Appearance: Normal  appearance. Morbidly obese  HENT:     Head: Normocephalic and atraumatic.      Mouth: Mucous membranes are moist.  Eyes:    Conjunctiva/sclera: Conjunctivae normal.     Pupils: Pupils are equal, round,  Cardiovascular:     Rate and Rhythm: Normal rate and regular rhythm.  Heart sounds:   Pulmonary:     Effort: Pulmonary effort is normal.     Breath sounds:   Abdominal:     General:     Palpations: Abdomen is soft.   Musculoskeletal:        General: Normal range of motion.   Skin:    General: Skin is warm and dry.     Comments:   Neurological:     General: grossly non focal and ambulatory     Mental Status: awake,  alert and oriented to person, place, and time.   Psychiatric:        Mood and Affect: Mood normal.   Lab Results Lab Results  Component Value Date   WBC 10.4 05/05/2023   HGB 14.9 05/05/2023   HCT 44.3 05/05/2023   MCV 91.4 05/05/2023   PLT 266.0 05/05/2023    Lab Results  Component Value Date   CREATININE 1.19 05/05/2023   BUN 23 05/05/2023   NA 138 05/05/2023   K 4.3 05/05/2023   CL 103 05/05/2023   CO2 25 05/05/2023    Lab Results  Component Value Date   ALT 13 10/29/2022   AST 12 10/29/2022   ALKPHOS 78 01/01/2022   BILITOT 0.6 01/01/2022    Lab Results  Component Value Date   CHOL 85 10/29/2022   HDL 37.40 (L) 10/29/2022   LDLCALC 25 10/29/2022   LDLDIRECT 118.0 09/14/2020   TRIG 114.0 10/29/2022   CHOLHDL 2 10/29/2022   No results found for: "LABRPR", "RPRTITER" No results found for: "HIV1RNAQUANT", "HIV1RNAVL", "CD4TABS"  Imaging   Assessment/Plan 32 Y O male with PMH of DM, HLD, HTN, Obesity/OSA and DDD s/p TLIFwith pedicle screw fixation on 9/30  complicated with   # Klebsiella Pneumoniae Lumbar Discitis and osteomyelitis complicated with hardware S/p prolonged IV cefazolin>> 12/29 switched to IV ceftriaxone  until 04/30/21 followed by PO suppression with cefadroxil due to + of hardware CT 3/6 findings noted, no  concerns for infection  # Medication Monitoring  - 1/21 CBC and BMP unremarkable  - Continue cefadroxil 500mg  PO BID for PO suppression due to presence of hardware. Medication refilled. He would prefer to be on antibiotics and does not want to take even slightest risk of recurrence.  - Fu in 4 months  # Chronic back pain  - controlled  - s/p trigger point injections - On  tizanidine and morphine - Follow with Dr Berline Chough, last seen 2/19  # Morbid Obesity/DM - A1C 6 - on medications - Fu with PCP as instructed, last seen 1/21  I have personally spent 30  minutes involved in face-to-face and non-face-to-face activities for this patient on the day of the visit including coordination of care with other specialist and counseling the patient/family.  Victoriano Lain, MD [facility] 06/18/2023, 8:44 AM

## 2023-06-23 ENCOUNTER — Ambulatory Visit: Payer: Commercial Managed Care - PPO | Admitting: Internal Medicine

## 2023-07-02 ENCOUNTER — Other Ambulatory Visit (HOSPITAL_COMMUNITY): Payer: Self-pay

## 2023-07-03 ENCOUNTER — Other Ambulatory Visit (HOSPITAL_COMMUNITY): Payer: Self-pay

## 2023-07-08 LAB — HM DIABETES EYE EXAM

## 2023-07-20 ENCOUNTER — Other Ambulatory Visit (HOSPITAL_COMMUNITY): Payer: Self-pay

## 2023-07-20 ENCOUNTER — Other Ambulatory Visit: Payer: Self-pay

## 2023-07-21 ENCOUNTER — Other Ambulatory Visit (HOSPITAL_COMMUNITY): Payer: Self-pay

## 2023-07-22 ENCOUNTER — Other Ambulatory Visit (HOSPITAL_COMMUNITY): Payer: Self-pay

## 2023-07-22 ENCOUNTER — Encounter: Payer: Self-pay | Admitting: Physical Medicine and Rehabilitation

## 2023-07-22 ENCOUNTER — Encounter: Payer: 59 | Attending: Physical Medicine and Rehabilitation | Admitting: Physical Medicine and Rehabilitation

## 2023-07-22 VITALS — BP 124/81 | HR 110 | Ht 74.0 in | Wt 308.8 lb

## 2023-07-22 DIAGNOSIS — M7918 Myalgia, other site: Secondary | ICD-10-CM | POA: Insufficient documentation

## 2023-07-22 DIAGNOSIS — M5416 Radiculopathy, lumbar region: Secondary | ICD-10-CM | POA: Insufficient documentation

## 2023-07-22 DIAGNOSIS — M5106 Intervertebral disc disorders with myelopathy, lumbar region: Secondary | ICD-10-CM | POA: Insufficient documentation

## 2023-07-22 MED ORDER — MORPHINE SULFATE ER 15 MG PO TBCR
15.0000 mg | EXTENDED_RELEASE_TABLET | Freq: Two times a day (BID) | ORAL | 0 refills | Status: DC
  Filled 2023-07-22 – 2023-08-11 (×2): qty 60, 30d supply, fill #0

## 2023-07-22 MED ORDER — MORPHINE SULFATE ER 15 MG PO TBCR
15.0000 mg | EXTENDED_RELEASE_TABLET | Freq: Two times a day (BID) | ORAL | 0 refills | Status: DC
  Filled 2023-07-22: qty 60, 30d supply, fill #0

## 2023-07-22 MED ORDER — LIDOCAINE HCL 1 % IJ SOLN
3.0000 mL | Freq: Once | INTRAMUSCULAR | Status: AC
  Administered 2023-07-22: 3 mL

## 2023-07-22 NOTE — Patient Instructions (Signed)
 Plan: Con't MS Contin 15 mg 2x/day-  for chronic pain- and sent in 2 Rx's- one that can be filled immediately and one in 1 month.   2. Patient here for trigger point injections for  Consent done and on chart.  Cleaned areas with alcohol and injected using a 27 gauge 1.5 inch needle  Injected 3cc- none wasted Using 1% Lidocaine with no EPI  Upper traps Levators Posterior scalenes Middle scalenes Splenius Capitus Pectoralis Major Rhomboids Infraspinatus Teres Major/minor Thoracic paraspinals Lumbar paraspinals-  B/L- lumbar paraspinals- 4 injections on each side.  Other injections-    Patient's level of pain prior was 8/10 Current level of pain after injections is- down to 4/10 immediately.   There was no bleeding or complications.  Patient was advised to drink a lot of water on day after injections to flush system Will have increased soreness for 12-48 hours after injections.  Can use Lidocaine patches the day AFTER injections Can use theracane on day of injections in places didn't inject Can use heating pad 4-6 hours AFTER injections  3. Doesn't need Urine drug screen- last done January 2025.    4. F/U in 6 weeks   5. Going to get ESI from [facility]- is due this month.  Find out when has appt.

## 2023-07-22 NOTE — Progress Notes (Signed)
 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  Also has DM- last A1c 6.2.      And for trigger point injections.                Sore in both places B/L low back-   Last [facility] 1/15- but has one coming up- in the next 30 days.     Nothing else going on-   Pain "OK" with pain meds- stays 5-6/10-  Just taking the MS Contin  When it really gets bad, takes 2-4 ibuprofen for back pain- and helps take the edge off more.    R ankle killing him- throbbing like toothache-  took 800 mg ibuprofen- worked for a few hours- back to hurting again- took at [employer], so has started to worn off. Hx of multiple of multiple sprains at work.   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   Plan: Con't MS Contin 15 mg 2x/day-  for chronic pain- and sent in 2 Rx's- one that can be filled immediately and one in 1 month.   2. Patient here for trigger point injections for  Consent done and on chart.  Cleaned areas with alcohol and injected using a 27 gauge 1.5 inch needle  Injected 3cc- none wasted Using 1% Lidocaine with no EPI  Upper traps Levators Posterior scalenes Middle scalenes Splenius Capitus Pectoralis Major Rhomboids Infraspinatus Teres Major/minor Thoracic paraspinals Lumbar paraspinals-  B/L- lumbar paraspinals- 4 injections on each side.  Other injections-    Patient's level of pain prior was 8/10 Current level of pain after injections is- down to 4/10 immediately.   There was no bleeding or complications.  Patient was advised to drink a lot of water on day after injections to flush system Will have increased soreness for 12-48 hours after injections.  Can use Lidocaine patches the day  AFTER injections Can use theracane on day of injections in places didn't inject Can use heating pad 4-6 hours AFTER injections  3. Doesn't need Urine drug screen- last done January 2025.    4. F/U in 6 weeks   5. Going to get ESI from [facility]- is due this month.  Find out when has appt.

## 2023-08-03 ENCOUNTER — Other Ambulatory Visit (HOSPITAL_COMMUNITY): Payer: Self-pay

## 2023-08-03 DIAGNOSIS — M5416 Radiculopathy, lumbar region: Secondary | ICD-10-CM | POA: Diagnosis not present

## 2023-08-11 ENCOUNTER — Other Ambulatory Visit (HOSPITAL_COMMUNITY): Payer: Self-pay

## 2023-08-24 ENCOUNTER — Other Ambulatory Visit (HOSPITAL_COMMUNITY): Payer: Self-pay

## 2023-08-27 ENCOUNTER — Other Ambulatory Visit: Payer: Self-pay | Admitting: Cardiology

## 2023-08-27 ENCOUNTER — Other Ambulatory Visit: Payer: Self-pay | Admitting: Physical Medicine and Rehabilitation

## 2023-08-27 DIAGNOSIS — I2510 Atherosclerotic heart disease of native coronary artery without angina pectoris: Secondary | ICD-10-CM

## 2023-08-28 ENCOUNTER — Other Ambulatory Visit: Payer: Self-pay

## 2023-08-28 ENCOUNTER — Other Ambulatory Visit (HOSPITAL_COMMUNITY): Payer: Self-pay

## 2023-08-28 MED ORDER — ROSUVASTATIN CALCIUM 20 MG PO TABS
20.0000 mg | ORAL_TABLET | Freq: Every day | ORAL | 0 refills | Status: DC
  Filled 2023-08-28: qty 30, 30d supply, fill #0

## 2023-08-28 MED ORDER — MORPHINE SULFATE ER 15 MG PO TBCR
15.0000 mg | EXTENDED_RELEASE_TABLET | Freq: Two times a day (BID) | ORAL | 0 refills | Status: DC
  Filled 2023-08-28: qty 60, 30d supply, fill #0

## 2023-09-02 ENCOUNTER — Ambulatory Visit (INDEPENDENT_AMBULATORY_CARE_PROVIDER_SITE_OTHER): Payer: Commercial Managed Care - PPO | Admitting: Internal Medicine

## 2023-09-02 ENCOUNTER — Encounter: Payer: Self-pay | Admitting: Internal Medicine

## 2023-09-02 ENCOUNTER — Other Ambulatory Visit: Payer: Self-pay | Admitting: Internal Medicine

## 2023-09-02 VITALS — BP 118/76 | HR 93 | Temp 98.1°F | Resp 16 | Ht 74.0 in | Wt 299.4 lb

## 2023-09-02 DIAGNOSIS — Z7984 Long term (current) use of oral hypoglycemic drugs: Secondary | ICD-10-CM

## 2023-09-02 DIAGNOSIS — Z6838 Body mass index (BMI) 38.0-38.9, adult: Secondary | ICD-10-CM | POA: Diagnosis not present

## 2023-09-02 DIAGNOSIS — E1159 Type 2 diabetes mellitus with other circulatory complications: Secondary | ICD-10-CM

## 2023-09-02 DIAGNOSIS — Z23 Encounter for immunization: Secondary | ICD-10-CM | POA: Diagnosis not present

## 2023-09-02 LAB — BASIC METABOLIC PANEL WITH GFR
BUN: 22 mg/dL (ref 6–23)
CO2: 28 meq/L (ref 19–32)
Calcium: 9.4 mg/dL (ref 8.4–10.5)
Chloride: 100 meq/L (ref 96–112)
Creatinine, Ser: 1.27 mg/dL (ref 0.40–1.50)
GFR: 61.56 mL/min (ref >60.00)
Glucose, Bld: 109 mg/dL — ABNORMAL HIGH (ref 70–99)
Potassium: 3.4 meq/L — ABNORMAL LOW (ref 3.5–5.1)
Sodium: 137 meq/L (ref 135–145)

## 2023-09-02 LAB — HEMOGLOBIN A1C: Hgb A1c MFr Bld: 6.2 % (ref 4.6–6.5)

## 2023-09-02 NOTE — Progress Notes (Signed)
 Subjective:    Patient ID: [name], male    DOB: [birth year], 58 y.o.   MRN: [id number]  DOS:  09/02/2023 Type of visit - description: follow up  No concern. Chronic medical problems addressed. Doing well with diet Denies symptoms of neuropathy Not on CPAP, good energy, no feeling sleepy.   Wt Readings from Last 3 Encounters:  09/02/23 299 lb 6 oz (135.8 kg)  07/22/23 (!) 308 lb 12.8 oz (140.1 kg)  06/18/23 (!) 306 lb (138.8 kg)     Review of Systems See above   Past Medical History:  Diagnosis Date   Abdominal pain 01/25/2021   DDD (degenerative disc disease), lumbar    Diabetes mellitus    Hyperlipemia    Hypertension    Obesity    OSA (obstructive sleep apnea)    on CPAP    Past Surgical History:  Procedure Laterality Date   APPLICATION OF ROBOTIC ASSISTANCE FOR SPINAL PROCEDURE N/A 01/11/2021   Procedure: APPLICATION OF ROBOTIC ASSISTANCE FOR SPINAL PROCEDURE;  Surgeon: Cannon Champion, MD;  Location: [facility] OR;  Service: Neurosurgery;  Laterality: N/A;   APPLICATION OF WOUND VAC N/A 01/25/2021   Procedure: APPLICATION OF WOUND VAC;  Surgeon: Dawley, Colby Daub, DO;  Location: [facility] OR;  Service: Neurosurgery;  Laterality: N/A;   BACK SURGERY     COLONOSCOPY  2019   LUMBAR WOUND DEBRIDEMENT N/A 01/25/2021   Procedure: Irrigation and Debridement of lumbar wound, and wound vacuum assisted closure;  Surgeon: Dawley, Colby Daub, DO;  Location: [facility] OR;  Service: Neurosurgery;  Laterality: N/A;   TRANSFORAMINAL LUMBAR INTERBODY FUSION (TLIF) WITH PEDICLE SCREW FIXATION 4 LEVEL N/A 01/11/2021   Procedure: Lumbar one-two, Lumbar two-three, Lumbar three-four, Lumbar four-five, Lumbar five Sacral one Open decompression, Transforaminal lumbar interbody fusion, posterolateral instrumented fusion;  Surgeon: Cannon Champion, MD;  Location: [facility] OR;  Service: Neurosurgery;  Laterality: N/A;    Current Outpatient Medications  Medication Instructions   ascorbic acid  (VITAMIN C) 500  mg, Oral, 2 times daily   Blood Glucose Monitoring Suppl (FREESTYLE LITE) w/Device KIT Use to check blood sugar up to 4 times daily as directed   cefadroxil  (DURICEF) 500 mg, Oral, 2 times daily   cholecalciferol (VITAMIN D3) 1,000 Units, Daily   famotidine-calcium  carbonate-magnesium  hydroxide (PEPCID COMPLETE) 10-800-165 MG chewable tablet 1 tablet, Daily PRN   fluticasone  (FLONASE ) 50 MCG/ACT nasal spray 2 sprays, Daily PRN   glucose blood (FREESTYLE LITE) test strip Use 1 strip to check blood sugar up to 4 times daily as directed   Lancets (FREESTYLE) lancets Use to check blood sugar up to 4 (four) times daily as directed   losartan -hydrochlorothiazide  (HYZAAR ) 50-12.5 MG tablet 1 tablet, Oral, Daily   metFORMIN  (GLUCOPHAGE ) 1,000 mg, Oral, 2 times daily with meals   morphine  (MS CONTIN ) 15 MG 12 hr tablet Take 1 tablet (15 mg total) by mouth every 12 (twelve) hours   morphine  (MS CONTIN ) 15 MG 12 hr tablet Take 1 tablet (15 mg total) by mouth every 12 (twelve) hours for chronic pain. (08/19/23)   morphine  (MS CONTIN ) 15 MG 12 hr tablet Take 1 tablet (15 mg total) by mouth every 12 (twelve) hours for chronic pain.   naloxone  (NARCAN ) nasal spray 4 mg/0.1 mL Use as directed   polyethylene glycol (MIRALAX  / GLYCOLAX ) 17 g, Oral, 2 times daily   rosuvastatin  (CRESTOR ) 20 mg, Oral, Daily, *Make appt with Dr. Audery Blazing for refills [phone]*   senna (SENOKOT) 25.8 mg, Oral,  2 times daily   simethicone  (MYLICON) 80 mg, Oral, 4 times daily PRN   tadalafil  (CIALIS ) 10-20 mg, Oral, Every 48 hours PRN   tamsulosin  (FLOMAX ) 0.4 mg, Oral, Daily at bedtime   tirzepatide  (MOUNJARO ) 5 mg, Subcutaneous, Weekly   tiZANidine  (ZANAFLEX ) 4 mg, Oral, 2 times daily   Zinc  Sulfate 220 mg, Oral, Daily       Objective:   Physical Exam BP 118/76   Pulse 93   Temp 98.1 F (36.7 C) (Oral)   Resp 16   Ht 6\' 2"  (1.88 m)   Wt 299 lb 6 oz (135.8 kg)   SpO2 97%   BMI 38.44 kg/m  General:   Well developed,  NAD, BMI noted. HEENT:  Normocephalic . Face symmetric, atraumatic Lungs:  CTA B Normal respiratory effort, no intercostal retractions, no accessory muscle use. Heart: RRR,  no murmur.  DM foot exam: No edema, good pedal pulses, pinprick examination normal. Skin: Not pale. Not jaundice Neurologic:  alert & oriented X3.  Speech normal, gait appropriate for age and unassisted Psych--  Cognition and judgment appear intact.  Cooperative with normal attention span and concentration.  Behavior appropriate. No anxious or depressed appearing.      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  Has Mounjaro  but not using it d/t h/o nausea; he will let me know if decides to try again Feet exam negative.  Check A1c HTN: Ambulatory BP 3 weeks ago was WNL, continue Hyzaar , check a CMP, continue monitoring. Morbid obesity: Has lost few additional pounds, working on his diet, praised! Preventive care: PNM 20 today RTC CPX 12-2023

## 2023-09-02 NOTE — Patient Instructions (Addendum)
 It was good to see you today.  Continue checking your blood pressure regularly Blood pressure goal:  between 110/65 and  135/85. If it is consistently higher or lower, let me know     GO TO THE LAB :  Get the blood work   Your results will be posted on MyChart with my comments  Next office visit for a physical exam by 12-2023 Please make an appointment before you leave today

## 2023-09-03 ENCOUNTER — Other Ambulatory Visit (HOSPITAL_COMMUNITY): Payer: Self-pay

## 2023-09-03 MED ORDER — METFORMIN HCL 1000 MG PO TABS
1000.0000 mg | ORAL_TABLET | Freq: Two times a day (BID) | ORAL | 1 refills | Status: DC
  Filled 2023-09-03: qty 180, 90d supply, fill #0
  Filled 2023-12-02: qty 180, 90d supply, fill #1

## 2023-09-03 NOTE — Assessment & Plan Note (Signed)
 DM: on Metformin .  Has Mounjaro  but not using it d/t h/o nausea; he will let me know if decides to try again Feet exam negative.  Check A1c HTN: Ambulatory BP 3 weeks ago was WNL, continue Hyzaar , check a CMP, continue monitoring. Morbid obesity: Has lost few additional pounds, working on his diet, praised! Preventive care: PNM 20 today RTC CPX 12-2023

## 2023-09-04 ENCOUNTER — Ambulatory Visit: Payer: Self-pay | Admitting: Internal Medicine

## 2023-09-14 ENCOUNTER — Other Ambulatory Visit (HOSPITAL_COMMUNITY): Payer: Self-pay

## 2023-09-14 ENCOUNTER — Encounter: Payer: Self-pay | Admitting: Physical Medicine and Rehabilitation

## 2023-09-17 ENCOUNTER — Other Ambulatory Visit: Payer: Self-pay

## 2023-09-17 ENCOUNTER — Telehealth: Payer: Self-pay

## 2023-09-17 ENCOUNTER — Other Ambulatory Visit (HOSPITAL_COMMUNITY): Payer: Self-pay

## 2023-09-17 MED ORDER — MORPHINE SULFATE 15 MG PO TABS
15.0000 mg | ORAL_TABLET | Freq: Three times a day (TID) | ORAL | 0 refills | Status: DC | PRN
  Filled 2023-09-17: qty 90, 30d supply, fill #0

## 2023-09-17 MED ORDER — OXYCODONE HCL 10 MG PO TABS
10.0000 mg | ORAL_TABLET | Freq: Three times a day (TID) | ORAL | 0 refills | Status: DC | PRN
  Filled 2023-09-17: qty 90, 30d supply, fill #0

## 2023-09-17 NOTE — Telephone Encounter (Signed)
 I called there's no morphine  within a 30 mile radius of [place] and some said they never heard of it in that dose of morphine   Thanks  masato pettie    09/14/23  4:11 PM You routed this conversation to Lovorn, Megan, MD Me to [name] RP    09/14/23  4:11 PM Please call around to your local pharmacy. Choose a location. Then call the office for the clinic line so that we can assist with the transfer.    Dr. Lovorn is out of the office for a few days. Please call the office so we can assist you with the Rx. ASAP  Last read by [name] at 11:14AM on 09/17/2023. [name] to [facility] Clinical Pool (supporting Celia Coles, MD)     09/14/23 11:41 AM I spoke with a pharmacist and they said they will be out of morphine  until August. If they lucky can you prescribe me something to help with a pain because I ran out last night was my last morphine  pill  Thanks  [name]   [name] cannot find the Morphine  anywhere. I called the [facility] and they do not have it in stock. Please advise.

## 2023-09-17 NOTE — Telephone Encounter (Signed)
 Pt cannot find any MS Contin - it's completely out- will send in MSIR- short acting morphine  to take 3x/day- 15 mg for chronic pain-to [place] [facility]-

## 2023-09-18 NOTE — Telephone Encounter (Signed)
 Patient informed.

## 2023-09-29 ENCOUNTER — Other Ambulatory Visit (HOSPITAL_COMMUNITY): Payer: Self-pay

## 2023-09-29 MED ORDER — TAMSULOSIN HCL 0.4 MG PO CAPS
0.4000 mg | ORAL_CAPSULE | Freq: Every day | ORAL | 5 refills | Status: DC
  Filled 2023-09-29: qty 30, 30d supply, fill #0
  Filled 2023-10-30: qty 30, 30d supply, fill #1
  Filled 2023-11-30: qty 30, 30d supply, fill #2
  Filled 2024-01-05: qty 30, 30d supply, fill #3
  Filled 2024-02-02 – 2024-02-03 (×2): qty 30, 30d supply, fill #4
  Filled 2024-03-09: qty 30, 30d supply, fill #5

## 2023-10-06 ENCOUNTER — Other Ambulatory Visit: Payer: Self-pay | Admitting: Cardiology

## 2023-10-06 ENCOUNTER — Other Ambulatory Visit: Payer: Self-pay

## 2023-10-06 DIAGNOSIS — I2510 Atherosclerotic heart disease of native coronary artery without angina pectoris: Secondary | ICD-10-CM

## 2023-10-09 ENCOUNTER — Other Ambulatory Visit (HOSPITAL_COMMUNITY): Payer: Self-pay

## 2023-10-09 MED ORDER — ROSUVASTATIN CALCIUM 20 MG PO TABS
20.0000 mg | ORAL_TABLET | Freq: Every day | ORAL | 0 refills | Status: AC
  Filled 2023-10-09: qty 15, 15d supply, fill #0

## 2023-10-21 ENCOUNTER — Encounter: Payer: 59 | Admitting: Physical Medicine and Rehabilitation

## 2023-10-23 ENCOUNTER — Telehealth: Payer: Self-pay | Admitting: Pharmacy Technician

## 2023-10-23 ENCOUNTER — Other Ambulatory Visit (HOSPITAL_COMMUNITY): Payer: Self-pay

## 2023-10-23 NOTE — Telephone Encounter (Signed)
 Pharmacy Patient Advocate Encounter   Received notification from CoverMyMeds that prior authorization for Mounjaro  2.5MG /0.5ML auto-injectors is due for renewal.   Insurance verification completed.   The patient is insured through [facility].  Action: Medication has been discontinued. Archived Key: [id number]

## 2023-10-26 ENCOUNTER — Other Ambulatory Visit: Payer: Self-pay

## 2023-10-26 ENCOUNTER — Ambulatory Visit: Admitting: Infectious Diseases

## 2023-10-26 ENCOUNTER — Other Ambulatory Visit (HOSPITAL_COMMUNITY): Payer: Self-pay

## 2023-10-26 VITALS — BP 132/84 | HR 91 | Temp 98.2°F | Ht 74.0 in | Wt 305.0 lb

## 2023-10-26 DIAGNOSIS — B961 Klebsiella pneumoniae [K. pneumoniae] as the cause of diseases classified elsewhere: Secondary | ICD-10-CM | POA: Diagnosis not present

## 2023-10-26 DIAGNOSIS — Z79899 Other long term (current) drug therapy: Secondary | ICD-10-CM

## 2023-10-26 DIAGNOSIS — M4626 Osteomyelitis of vertebra, lumbar region: Secondary | ICD-10-CM

## 2023-10-26 DIAGNOSIS — Z6839 Body mass index (BMI) 39.0-39.9, adult: Secondary | ICD-10-CM

## 2023-10-26 DIAGNOSIS — T847XXD Infection and inflammatory reaction due to other internal orthopedic prosthetic devices, implants and grafts, subsequent encounter: Secondary | ICD-10-CM

## 2023-10-26 DIAGNOSIS — M4646 Discitis, unspecified, lumbar region: Secondary | ICD-10-CM | POA: Diagnosis not present

## 2023-10-26 DIAGNOSIS — G8929 Other chronic pain: Secondary | ICD-10-CM | POA: Insufficient documentation

## 2023-10-26 DIAGNOSIS — A498 Other bacterial infections of unspecified site: Secondary | ICD-10-CM

## 2023-10-26 MED ORDER — CEFADROXIL 500 MG PO CAPS
500.0000 mg | ORAL_CAPSULE | Freq: Two times a day (BID) | ORAL | 5 refills | Status: DC
  Filled 2023-10-26 – 2023-10-30 (×2): qty 60, 30d supply, fill #0
  Filled 2023-12-17: qty 60, 30d supply, fill #1
  Filled 2024-01-05 – 2024-01-11 (×2): qty 60, 30d supply, fill #2
  Filled 2024-02-14: qty 60, 30d supply, fill #3

## 2023-10-26 NOTE — Progress Notes (Addendum)
 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   Klebsiella pneumoniae infection 01/28/2021   Wound infection after surgery 01/25/2021   Muscle spasms of both lower extremities 01/25/2021   Spleen enlarged 01/25/2021   Lumbar radiculopathy 01/11/2021   Neuropathy 09/28/2020   Chronic pain syndrome 09/28/2020   Intervertebral lumbar disc disorder with myelopathy, lumbar region 09/28/2020   Spondylosis, cervical, with myelopathy 09/28/2020   PCP NOTES >>>>>>>> 07/09/2015   DJD (degenerative joint disease) 10/27/2014   OSA (obstructive sleep apnea) 06/14/2012   ED (erectile dysfunction) 04/26/2012   Annual physical exam 12/29/2011   Achilles tendinitis 09/10/2010   Allergic rhinitis 07/12/2010   Hyperlipidemia 04/26/2007   DM II (diabetes mellitus, type II), controlled (HCC) 04/22/2006   Essential hypertension 04/22/2006   Current Outpatient Medications on File Prior to Visit  Medication Sig Dispense Refill   ascorbic acid  (VITAMIN C) 500 MG tablet Take 1 tablet (500 mg total) by mouth 2 (two) times daily. (Patient taking differently: Take 500 mg by mouth daily.) 100 tablet 3   Blood Glucose Monitoring Suppl (FREESTYLE LITE) w/Device KIT Use to check blood sugar up to 4 times daily as directed 1 kit 0   cholecalciferol (VITAMIN D3) 25 MCG (1000 UNIT) tablet Take 1,000 Units by mouth daily.     famotidine-calcium  carbonate-magnesium  hydroxide (PEPCID COMPLETE) 10-800-165 MG chewable tablet Chew 1 tablet by mouth daily as needed.      fluticasone  (FLONASE ) 50 MCG/ACT nasal spray Place 2 sprays into both nostrils daily as needed.     glucose blood (FREESTYLE LITE) test strip Use 1 strip to check blood sugar up to 4 times daily as directed 200 each 0   Lancets (FREESTYLE) lancets Use to check blood sugar up to 4 (four) times daily as directed 200 each 0   losartan -hydrochlorothiazide  (HYZAAR ) 50-12.5 MG tablet Take 1 tablet by mouth daily. 90 tablet 1   metFORMIN  (GLUCOPHAGE ) 1000 MG tablet Take 1 tablet (1,000 mg total) by mouth 2 (two) times daily with a meal. 180 tablet 1   morphine  (MS CONTIN ) 15 MG 12 hr tablet Take 1 tablet (15 mg total) by mouth every 12 (twelve) hours 60 tablet 0   morphine  (MS CONTIN ) 15 MG 12 hr tablet Take 1 tablet (15 mg total) by mouth every 12 (twelve) hours for chronic pain. (08/19/23) 60 tablet 0   morphine  (MS CONTIN ) 15 MG 12 hr tablet Take 1 tablet (15 mg total) by mouth every 12 (twelve) hours for chronic pain. 60 tablet 0   Oxycodone  HCl 10 MG TABS Take 1 tablet (10 mg total) by mouth 3 (three) times daily as needed for severe pain (pain score 7-10). To take the place of MS Contin  15 mg BID 90 tablet 0   polyethylene glycol (MIRALAX  / GLYCOLAX ) 17 g packet Take 17 g by mouth 2 (two) times daily. 14 each 0   rosuvastatin  (CRESTOR ) 20 MG tablet Take 1 tablet (20 mg total) by mouth daily. *Make appt with Dr. Pietro for refills [phone]* 15 tablet 0   senna (  SENOKOT) 8.6 MG TABS tablet Take 3 tablets (25.8 mg total) by mouth 2 (two) times daily. 270 tablet 0   simethicone  (MYLICON) 80 MG chewable tablet Chew 1 tablet (80 mg total) by mouth 4 (four) times daily as needed for flatulence. 60 tablet 0   tadalafil  (CIALIS ) 20 MG tablet Take 0.5-1 tablets (10-20 mg total) by mouth every other day as needed for erectile dysfunction. 30 tablet 3   tamsulosin  (FLOMAX ) 0.4 MG CAPS capsule Take 1 capsule (0.4 mg total) by mouth at bedtime. 30 capsule 5   tiZANidine  (ZANAFLEX ) 4 MG tablet Take 1 tablet (4 mg  total) by mouth 2 (two) times daily. (Patient taking differently: Take 4 mg by mouth in the morning and at bedtime. AS NEEDED) 60 tablet 5   Zinc  Sulfate 220 (50 Zn) MG TABS Take 1 tablet (220 mg total) by mouth daily. 100 tablet 5   naloxone  (NARCAN ) nasal spray 4 mg/0.1 mL Use as directed (Patient not taking: Reported on 10/26/2023) 2 each 1   No current facility-administered medications on file prior to visit.   Subjective: [name] is a 58 Year old male with PMH of DM, HLD, HTN, Obesity/OSA and DDD s/p recent TLIFwith pedicel screw fixation on 9/30 complicated by Kleb pneumo bacteremia post operatively concerning for surgical site infection following 10/14 debridement of lumbar wound, concerning for deep lumbar wound infection, planned for 6 weeks IV cefazolin , however course complicated by worsening back pain and increased inflammatory markers. MRI was repeated 10/29 osteomyelitis discitis at L1-2 with associated small amount of epidural phlegmon and/or abscess within the left ventral epidural space extending from L1-2 to approximately L2-3/Associated paraspinous edema/phlegmon within the adjacent left greater than right psoas musculature, with superimposed 1 cm left psoas soft tissue abscess.  Neurosurgery did not feel another surgery was indicated and recommended IR consult for drainage of left psoas abscess. IR felt the findings in the left psoas was more of oedema than an abscess and was not drained. ID recommended repeat MRI in 2 weeks. Repeat MRI 11/23 with overall similar findings, left psoas abscess ( 1.2cm , previously 0.9cm), Additional enhancing fluid collection at the anterior left aspect of the L1-L2 disc space measures approximately 1.6 x 3.1 x 3.2 cm likely an additional focus of abscess. Small amount of phlegmon within the left ventral epidural space L2-L3. MRI findings discussed with Dr Fleeta Rothman and recommended IV abtx for 4 weeks with ID follow up. Patient discharged from the hospital  03/08/21.   IV cefazolin  was switched to IV ceftriaxone  04/11/2021 in the setting of  hardware loosening and erosive changes seen in the recent CT mechanical failure or infection related while on prolonged IV cefazolin  course. IV ceftriaxone  was continued until 04/30/21 followed by PO suppression with cefadroxil  due to + of hardware which he has been consistently taking so far  He has been following closely with pain medicine for pain management as well as neurosurgery.  Lumbar back wound has healed   06/18/23 Taking cefadroxil , no concerns with abtx or missed doses. Stopped taking Moujarao recently due to vomiting. Lost 35 lbs while taking mounjaro . Discussed about continuing antibiotics vs trialing off abtx as he has been more than 2 years on PO suppression but possible riskof recurrence of infection to which he said he does not want to get another back surgery if in case infection recurs and prefers to be on antibiotics forever as long he does not have issues. No new concerns otherwise.   7/14  Last  seen by Pain medicine on 4/9 and  PCP on 5/21. Reports he is only on metformin  for the DM and reports he has gained approx 5 lbs since last visit (weight last visit was 306 lbs and today 305 lbs). He reports working on his diet for weight loss. Continues to take PO cefadroxil  without missed doses or concerns.   Review of Systems: All other systems reviewed  with pertinent positives and negatives as listed above  Past Medical History:  Diagnosis Date   Abdominal pain 01/25/2021   DDD (degenerative disc disease), lumbar    Diabetes mellitus    Hyperlipemia    Hypertension    Obesity    OSA (obstructive sleep apnea)    on CPAP   Past Surgical History:  Procedure Laterality Date   APPLICATION OF ROBOTIC ASSISTANCE FOR SPINAL PROCEDURE N/A 01/11/2021   Procedure: APPLICATION OF ROBOTIC ASSISTANCE FOR SPINAL PROCEDURE;  Surgeon: Cheryle Debby LABOR, MD;  Location: [facility] OR;  Service: Neurosurgery;   Laterality: N/A;   APPLICATION OF WOUND VAC N/A 01/25/2021   Procedure: APPLICATION OF WOUND VAC;  Surgeon: Dawley, Lani BROCKS, DO;  Location: [facility] OR;  Service: Neurosurgery;  Laterality: N/A;   BACK SURGERY     COLONOSCOPY  2019   LUMBAR WOUND DEBRIDEMENT N/A 01/25/2021   Procedure: Irrigation and Debridement of lumbar wound, and wound vacuum assisted closure;  Surgeon: Dawley, Lani BROCKS, DO;  Location: [facility] OR;  Service: Neurosurgery;  Laterality: N/A;   TRANSFORAMINAL LUMBAR INTERBODY FUSION (TLIF) WITH PEDICLE SCREW FIXATION 4 LEVEL N/A 01/11/2021   Procedure: Lumbar one-two, Lumbar two-three, Lumbar three-four, Lumbar four-five, Lumbar five Sacral one Open decompression, Transforaminal lumbar interbody fusion, posterolateral instrumented fusion;  Surgeon: Cheryle Debby LABOR, MD;  Location: [facility] OR;  Service: Neurosurgery;  Laterality: N/A;    Social History   Tobacco Use   Smoking status: Never   Smokeless tobacco: Never  Vaping Use   Vaping status: Never Used  Substance Use Topics   Alcohol use: No   Drug use: No    Family History  Problem Relation Age of Onset   Diabetes Mother    Hypertension Neg Hx    Coronary artery disease Neg Hx    Colon cancer Neg Hx    Prostate cancer Neg Hx     Allergies  Allergen Reactions   Penicillins Other (See Comments) and Swelling    REACTION: Blister and Sores in the mouth  Blisters in mouth, REACTION: Blister and Sores in the mouth   Duloxetine  Hcl Other (See Comments)    Other Reaction(s): GI Intolerance    Health Maintenance  Topic Date Due   Hepatitis B Vaccines (3 of 3 - 19+ 3-dose series) 11/16/2006   Diabetic kidney evaluation - Urine ACR  07/31/2010   COVID-19 Vaccine (5 - 2024-25 season) 12/14/2022   INFLUENZA VACCINE  11/13/2023   HEMOGLOBIN A1C  03/04/2024   OPHTHALMOLOGY EXAM  05/04/2024   Diabetic kidney evaluation - eGFR measurement  09/01/2024   FOOT EXAM  09/01/2024   Colonoscopy  01/11/2025   DTaP/Tdap/Td (4 - Td or  Tdap) 06/02/2032   Pneumococcal Vaccine 32-66 Years old  Completed   Hepatitis C Screening  Completed   HIV Screening  Completed   Zoster Vaccines- Shingrix   Completed   HPV VACCINES  Aged Out   Meningococcal B Vaccine  Aged Out   Objective: BP 132/84   Pulse 91   Temp 98.2 F (36.8 C) (Temporal)   Ht 6'  2 (1.88 m)   Wt (!) 305 lb (138.3 kg)   SpO2 97%   BMI 39.16 kg/m    Physical Exam Constitutional:      Appearance: Normal appearance. Morbidly obese  HENT:     Head: Normocephalic and atraumatic.      Mouth: Mucous membranes are moist.  Eyes:    Conjunctiva/sclera: Conjunctivae normal.     Pupils: Pupils are equal, round  Cardiovascular:     Rate and Rhythm: Normal rate and regular rhythm.     Heart sounds:   Pulmonary:     Effort: Pulmonary effort is normal.     Breath sounds:   Abdominal:     General:     Palpations: Abdomen is soft.   Musculoskeletal:        General: Normal range of motion.   Skin:    General: Skin is warm and dry.     Comments:   Neurological:     General: grossly non focal and ambulatory     Mental Status: awake,  alert and oriented to person, place, and time.   Psychiatric:        Mood and Affect: Mood normal.   Lab Results Lab Results  Component Value Date   WBC 10.4 05/05/2023   HGB 14.9 05/05/2023   HCT 44.3 05/05/2023   MCV 91.4 05/05/2023   PLT 266.0 05/05/2023    Lab Results  Component Value Date   CREATININE 1.27 09/02/2023   BUN 22 09/02/2023   NA 137 09/02/2023   K 3.4 (L) 09/02/2023   CL 100 09/02/2023   CO2 28 09/02/2023    Lab Results  Component Value Date   ALT 13 10/29/2022   AST 12 10/29/2022   ALKPHOS 78 01/01/2022   BILITOT 0.6 01/01/2022    Lab Results  Component Value Date   CHOL 85 10/29/2022   HDL 37.40 (L) 10/29/2022   LDLCALC 25 10/29/2022   LDLDIRECT 118.0 09/14/2020   TRIG 114.0 10/29/2022   CHOLHDL 2 10/29/2022   No results found for: LABRPR, RPRTITER No results found for:  HIV1RNAQUANT, HIV1RNAVL, CD4TABS  Imaging   Assessment/Plan 62 Y O male with PMH of DM, HLD, HTN, Obesity/OSA and DDD s/p TLIFwith pedicle screw fixation on 9/30  complicated with   # Klebsiella Pneumoniae Lumbar Discitis and osteomyelitis complicated with hardware S/p prolonged IV cefazolin >> 12/29 switched to IV ceftriaxone   until 04/30/21 followed by PO suppression with cefadroxil  due to + of hardware CT 3/6 findings noted, no concerns for infection  # Medication Monitoring   - Continue cefadroxil  500mg  PO BID for PO suppression due to presence of hardware. Medication refilled. He would prefer to be on antibiotics and does not want to take even slightest risk of recurrence. - labs today  - Fu in 4 months  # Chronic back pain  - controlled  - s/p trigger point injections - On  tizanidine , oxycodone  and morphine  - Fu with Dr Lovorn  # Morbid Obesity/DM - A1C 6 - on medications - Fu with PCP as instructed  I spent 22  minutes involved in face-to-face and non-face-to-face activities for this patient on the day of the visit including reviewing notes from Dr Amon, Dr Cornelio, ordering labs/medications, documenting notes in EMR and counseling the patient.  Annalee Joseph, MD [facility] 10/26/2023, 8:41 AM

## 2023-10-26 NOTE — Addendum Note (Signed)
 Addended by: DEA SHINER on: 10/26/2023 06:38 PM   Modules accepted: Level of Service

## 2023-10-27 ENCOUNTER — Ambulatory Visit: Payer: Self-pay | Admitting: Infectious Diseases

## 2023-10-27 LAB — CBC
HCT: 45.5 % (ref 38.5–50.0)
Hemoglobin: 14.7 g/dL (ref 13.2–17.1)
MCH: 29.9 pg (ref 27.0–33.0)
MCHC: 32.3 g/dL (ref 32.0–36.0)
MCV: 92.5 fL (ref 80.0–100.0)
MPV: 10.9 fL (ref 7.5–12.5)
Platelets: 228 Thousand/uL (ref 140–400)
RBC: 4.92 Million/uL (ref 4.20–5.80)
RDW: 13.8 % (ref 11.0–15.0)
WBC: 8 Thousand/uL (ref 3.8–10.8)

## 2023-10-27 LAB — BASIC METABOLIC PANEL WITH GFR
BUN/Creatinine Ratio: 11 (calc) (ref 6–22)
BUN: 15 mg/dL (ref 7–25)
CO2: 28 mmol/L (ref 20–32)
Calcium: 9.3 mg/dL (ref 8.6–10.3)
Chloride: 100 mmol/L (ref 98–110)
Creat: 1.38 mg/dL — ABNORMAL HIGH (ref 0.70–1.35)
Glucose, Bld: 154 mg/dL — ABNORMAL HIGH (ref 65–99)
Potassium: 3.7 mmol/L (ref 3.5–5.3)
Sodium: 136 mmol/L (ref 135–146)
eGFR: 59 mL/min/1.73m2 — ABNORMAL LOW (ref >=60)

## 2023-10-30 ENCOUNTER — Other Ambulatory Visit (HOSPITAL_COMMUNITY): Payer: Self-pay

## 2023-10-30 ENCOUNTER — Other Ambulatory Visit: Payer: Self-pay | Admitting: Physical Medicine and Rehabilitation

## 2023-10-30 MED ORDER — OXYCODONE HCL 10 MG PO TABS
10.0000 mg | ORAL_TABLET | Freq: Three times a day (TID) | ORAL | 0 refills | Status: DC | PRN
  Filled 2023-10-30: qty 49, 16d supply, fill #0
  Filled 2023-10-30: qty 41, 14d supply, fill #0

## 2023-11-03 DIAGNOSIS — M5416 Radiculopathy, lumbar region: Secondary | ICD-10-CM | POA: Diagnosis not present

## 2023-11-15 ENCOUNTER — Encounter (HOSPITAL_BASED_OUTPATIENT_CLINIC_OR_DEPARTMENT_OTHER): Payer: Self-pay | Admitting: Emergency Medicine

## 2023-11-15 ENCOUNTER — Other Ambulatory Visit: Payer: Self-pay

## 2023-11-15 ENCOUNTER — Observation Stay (HOSPITAL_BASED_OUTPATIENT_CLINIC_OR_DEPARTMENT_OTHER)
Admission: EM | Admit: 2023-11-15 | Discharge: 2023-11-17 | Disposition: A | Discharge status: Home / Self Care | Attending: Internal Medicine | Admitting: Internal Medicine

## 2023-11-15 DIAGNOSIS — E785 Hyperlipidemia, unspecified: Secondary | ICD-10-CM | POA: Diagnosis present

## 2023-11-15 DIAGNOSIS — E119 Type 2 diabetes mellitus without complications: Secondary | ICD-10-CM | POA: Insufficient documentation

## 2023-11-15 DIAGNOSIS — M545 Low back pain, unspecified: Secondary | ICD-10-CM | POA: Diagnosis not present

## 2023-11-15 DIAGNOSIS — M869 Osteomyelitis, unspecified: Secondary | ICD-10-CM | POA: Insufficient documentation

## 2023-11-15 DIAGNOSIS — K828 Other specified diseases of gallbladder: Secondary | ICD-10-CM | POA: Diagnosis not present

## 2023-11-15 DIAGNOSIS — K8020 Calculus of gallbladder without cholecystitis without obstruction: Secondary | ICD-10-CM | POA: Diagnosis not present

## 2023-11-15 DIAGNOSIS — R109 Unspecified abdominal pain: Secondary | ICD-10-CM | POA: Diagnosis present

## 2023-11-15 DIAGNOSIS — K810 Acute cholecystitis: Principal | ICD-10-CM | POA: Diagnosis present

## 2023-11-15 DIAGNOSIS — N400 Enlarged prostate without lower urinary tract symptoms: Secondary | ICD-10-CM | POA: Insufficient documentation

## 2023-11-15 DIAGNOSIS — I10 Essential (primary) hypertension: Secondary | ICD-10-CM | POA: Diagnosis present

## 2023-11-15 DIAGNOSIS — K8000 Calculus of gallbladder with acute cholecystitis without obstruction: Secondary | ICD-10-CM | POA: Diagnosis not present

## 2023-11-15 DIAGNOSIS — R161 Splenomegaly, not elsewhere classified: Secondary | ICD-10-CM | POA: Diagnosis not present

## 2023-11-15 DIAGNOSIS — R1013 Epigastric pain: Principal | ICD-10-CM

## 2023-11-15 LAB — CBC WITH DIFFERENTIAL/PLATELET
Abs Immature Granulocytes: 0.09 K/uL — ABNORMAL HIGH (ref 0.00–0.07)
Basophils Absolute: 0.1 K/uL (ref 0.0–0.1)
Basophils Relative: 0 %
Eosinophils Absolute: 0 K/uL (ref 0.0–0.5)
Eosinophils Relative: 0 %
HCT: 47.1 % (ref 39.0–52.0)
Hemoglobin: 16.1 g/dL (ref 13.0–17.0)
Immature Granulocytes: 1 %
Lymphocytes Relative: 4 %
Lymphs Abs: 0.7 K/uL (ref 0.7–4.0)
MCH: 29.9 pg (ref 26.0–34.0)
MCHC: 34.2 g/dL (ref 30.0–36.0)
MCV: 87.5 fL (ref 80.0–100.0)
Monocytes Absolute: 0.8 K/uL (ref 0.1–1.0)
Monocytes Relative: 5 %
Neutro Abs: 15.8 K/uL — ABNORMAL HIGH (ref 1.7–7.7)
Neutrophils Relative %: 90 %
Platelets: 266 K/uL (ref 150–400)
RBC: 5.38 MIL/uL (ref 4.22–5.81)
RDW: 13.9 % (ref 11.5–15.5)
WBC: 17.5 K/uL — ABNORMAL HIGH (ref 4.0–10.5)
nRBC: 0 % (ref 0.0–0.2)

## 2023-11-15 LAB — COMPREHENSIVE METABOLIC PANEL WITH GFR
ALT: 36 U/L (ref 0–44)
AST: 64 U/L — ABNORMAL HIGH (ref 15–41)
Albumin: 4.6 g/dL (ref 3.5–5.0)
Alkaline Phosphatase: 84 U/L (ref 38–126)
Anion gap: 19 — ABNORMAL HIGH (ref 5–15)
BUN: 21 mg/dL — ABNORMAL HIGH (ref 6–20)
CO2: 23 mmol/L (ref 22–32)
Calcium: 9.7 mg/dL (ref 8.9–10.3)
Chloride: 96 mmol/L — ABNORMAL LOW (ref 98–111)
Creatinine, Ser: 1.34 mg/dL — ABNORMAL HIGH (ref 0.61–1.24)
GFR, Estimated: >60 mL/min (ref >60)
Glucose, Bld: 196 mg/dL — ABNORMAL HIGH (ref 70–99)
Potassium: 3.8 mmol/L (ref 3.5–5.1)
Sodium: 137 mmol/L (ref 135–145)
Total Bilirubin: 1.6 mg/dL — ABNORMAL HIGH (ref 0.0–1.2)
Total Protein: 7.2 g/dL (ref 6.5–8.1)

## 2023-11-15 LAB — LIPASE, BLOOD: Lipase: 25 U/L (ref 11–51)

## 2023-11-15 LAB — TROPONIN T, HIGH SENSITIVITY: Troponin T High Sensitivity: <15 ng/L (ref <19)

## 2023-11-15 NOTE — ED Triage Notes (Signed)
 Pt c/o epigastric pain that started suddenly around 1930; has vomited x 2 since then

## 2023-11-16 ENCOUNTER — Emergency Department (HOSPITAL_BASED_OUTPATIENT_CLINIC_OR_DEPARTMENT_OTHER)

## 2023-11-16 ENCOUNTER — Observation Stay (HOSPITAL_COMMUNITY)

## 2023-11-16 ENCOUNTER — Encounter (HOSPITAL_COMMUNITY): Payer: Self-pay | Admitting: Internal Medicine

## 2023-11-16 DIAGNOSIS — R161 Splenomegaly, not elsewhere classified: Secondary | ICD-10-CM | POA: Diagnosis not present

## 2023-11-16 DIAGNOSIS — K810 Acute cholecystitis: Secondary | ICD-10-CM | POA: Diagnosis present

## 2023-11-16 DIAGNOSIS — E785 Hyperlipidemia, unspecified: Secondary | ICD-10-CM | POA: Diagnosis not present

## 2023-11-16 DIAGNOSIS — R109 Unspecified abdominal pain: Secondary | ICD-10-CM | POA: Diagnosis present

## 2023-11-16 DIAGNOSIS — K828 Other specified diseases of gallbladder: Secondary | ICD-10-CM | POA: Diagnosis not present

## 2023-11-16 DIAGNOSIS — K819 Cholecystitis, unspecified: Secondary | ICD-10-CM | POA: Diagnosis not present

## 2023-11-16 DIAGNOSIS — K8000 Calculus of gallbladder with acute cholecystitis without obstruction: Secondary | ICD-10-CM | POA: Diagnosis not present

## 2023-11-16 DIAGNOSIS — M869 Osteomyelitis, unspecified: Secondary | ICD-10-CM | POA: Diagnosis not present

## 2023-11-16 DIAGNOSIS — M545 Low back pain, unspecified: Secondary | ICD-10-CM | POA: Diagnosis not present

## 2023-11-16 DIAGNOSIS — E119 Type 2 diabetes mellitus without complications: Secondary | ICD-10-CM | POA: Diagnosis not present

## 2023-11-16 DIAGNOSIS — N400 Enlarged prostate without lower urinary tract symptoms: Secondary | ICD-10-CM | POA: Diagnosis not present

## 2023-11-16 DIAGNOSIS — R1013 Epigastric pain: Secondary | ICD-10-CM | POA: Diagnosis not present

## 2023-11-16 DIAGNOSIS — I10 Essential (primary) hypertension: Secondary | ICD-10-CM | POA: Diagnosis not present

## 2023-11-16 LAB — URINALYSIS, ROUTINE W REFLEX MICROSCOPIC
Bilirubin Urine: NEGATIVE
Glucose, UA: NEGATIVE mg/dL
Hgb urine dipstick: NEGATIVE
Ketones, ur: NEGATIVE mg/dL
Leukocytes,Ua: NEGATIVE
Nitrite: NEGATIVE
Protein, ur: NEGATIVE mg/dL
Specific Gravity, Urine: >=1.03 (ref 1.005–1.030)
pH: 5.5 (ref 5.0–8.0)

## 2023-11-16 LAB — GLUCOSE, CAPILLARY
Glucose-Capillary: 122 mg/dL — ABNORMAL HIGH (ref 70–99)
Glucose-Capillary: 115 mg/dL — ABNORMAL HIGH (ref 70–99)
Glucose-Capillary: 234 mg/dL — ABNORMAL HIGH (ref 70–99)

## 2023-11-16 LAB — TROPONIN T, HIGH SENSITIVITY: Troponin T High Sensitivity: 16 ng/L (ref <19)

## 2023-11-16 MED ORDER — TECHNETIUM TC 99M MEBROFENIN IV KIT
5.0000 | PACK | Freq: Once | INTRAVENOUS | Status: AC
  Administered 2023-11-16: 5 via INTRAVENOUS

## 2023-11-16 MED ORDER — LACTATED RINGERS IV SOLN: INTRAVENOUS | Status: DC

## 2023-11-16 MED ORDER — LOSARTAN POTASSIUM-HCTZ 50-12.5 MG PO TABS: 1.0000 | ORAL_TABLET | Freq: Every day | ORAL | Status: DC

## 2023-11-16 MED ORDER — IOHEXOL 300 MG/ML  SOLN
100.0000 mL | Freq: Once | INTRAMUSCULAR | Status: AC | PRN
  Administered 2023-11-16: 100 mL via INTRAVENOUS

## 2023-11-16 MED ORDER — ONDANSETRON HCL 4 MG/2ML IJ SOLN: 4.0000 mg | Freq: Four times a day (QID) | INTRAMUSCULAR | Status: DC | PRN

## 2023-11-16 MED ORDER — HYDROMORPHONE HCL 1 MG/ML IJ SOLN
0.5000 mg | INTRAMUSCULAR | Status: DC | PRN
  Administered 2023-11-16 – 2023-11-17 (×2): 0.5 mg via INTRAVENOUS
  Filled 2023-11-16 (×2): qty 0.5

## 2023-11-16 MED ORDER — LOSARTAN POTASSIUM 50 MG PO TABS
50.0000 mg | ORAL_TABLET | Freq: Every day | ORAL | Status: DC
  Administered 2023-11-16: 50 mg via ORAL
  Filled 2023-11-16: qty 1

## 2023-11-16 MED ORDER — HYDROCHLOROTHIAZIDE 12.5 MG PO TABS
12.5000 mg | ORAL_TABLET | Freq: Every day | ORAL | Status: DC
  Administered 2023-11-16: 12.5 mg via ORAL
  Filled 2023-11-16: qty 1

## 2023-11-16 MED ORDER — ACETAMINOPHEN 650 MG RE SUPP: 650.0000 mg | Freq: Four times a day (QID) | RECTAL | Status: DC | PRN

## 2023-11-16 MED ORDER — TAMSULOSIN HCL 0.4 MG PO CAPS
0.4000 mg | ORAL_CAPSULE | Freq: Every day | ORAL | Status: DC
  Administered 2023-11-16: 0.4 mg via ORAL
  Filled 2023-11-16: qty 1

## 2023-11-16 MED ORDER — CEFADROXIL 500 MG PO CAPS
500.0000 mg | ORAL_CAPSULE | Freq: Two times a day (BID) | ORAL | Status: DC
  Filled 2023-11-16: qty 1

## 2023-11-16 MED ORDER — SODIUM CHLORIDE 0.9 % IV BOLUS
1000.0000 mL | Freq: Once | INTRAVENOUS | Status: AC
  Administered 2023-11-16: 1000 mL via INTRAVENOUS

## 2023-11-16 MED ORDER — ROSUVASTATIN CALCIUM 10 MG PO TABS
20.0000 mg | ORAL_TABLET | Freq: Every day | ORAL | Status: DC
  Administered 2023-11-16: 20 mg via ORAL
  Filled 2023-11-16: qty 2

## 2023-11-16 MED ORDER — HYDROMORPHONE HCL 1 MG/ML IJ SOLN
0.5000 mg | INTRAMUSCULAR | Status: AC | PRN
  Administered 2023-11-16 (×3): 0.5 mg via INTRAVENOUS
  Filled 2023-11-16 (×2): qty 0.5
  Filled 2023-11-16: qty 1

## 2023-11-16 MED ORDER — INSULIN ASPART 100 UNIT/ML IJ SOLN
0.0000 [IU] | INTRAMUSCULAR | Status: DC
  Administered 2023-11-16: 5 [IU] via SUBCUTANEOUS
  Administered 2023-11-16 – 2023-11-17 (×2): 2 [IU] via SUBCUTANEOUS

## 2023-11-16 MED ORDER — ACETAMINOPHEN 325 MG PO TABS: 650.0000 mg | ORAL_TABLET | Freq: Four times a day (QID) | ORAL | Status: DC | PRN

## 2023-11-16 MED ORDER — METOCLOPRAMIDE HCL 5 MG/ML IJ SOLN
10.0000 mg | Freq: Once | INTRAMUSCULAR | Status: AC
  Administered 2023-11-16: 10 mg via INTRAVENOUS
  Filled 2023-11-16: qty 2

## 2023-11-16 MED ORDER — SODIUM CHLORIDE 0.9 % IV SOLN
2.0000 g | INTRAVENOUS | Status: DC
  Administered 2023-11-16: 2 g via INTRAVENOUS
  Filled 2023-11-16: qty 20

## 2023-11-16 MED ORDER — ALBUTEROL SULFATE (2.5 MG/3ML) 0.083% IN NEBU: 2.5000 mg | INHALATION_SOLUTION | RESPIRATORY_TRACT | Status: DC | PRN

## 2023-11-16 NOTE — ED Provider Notes (Signed)
 Blood pressure 109/78, pulse 74, temperature (!) 97.4 F (36.3 C), temperature source Oral, resp. rate 16, height 6' 2 (1.88 m), weight (!) 138.3 kg, SpO2 96%.  Assuming care from Dr. Trine.  In short, LYONEL MOREJON is a 58 y.o. male with a chief complaint of Abdominal Pain .  Refer to the original H&P for additional details.  The current plan of care is to f/u on US  and reassess.  09:16 AM  Ultrasound results show pericholecystic fluid with wall thickening and cholelithiasis.  There is no documented sonographic Murphy sign.  I reevaluated the patient and palpated the abdomen and agree he does not have focal tenderness in the right upper quadrant.  Story is somewhat concerning for gallbladder etiology.  He had some acute onset epigastric pain while cooking but not while eating.  He did have some vomiting.  He is currently pain-free.  Labs show leukocytosis and minimally elevated bilirubin at 1.6 with minimally elevated AST at 64.  Lipase normal.  Will discuss with general surgery.  Considering his radiographic findings and story we will consider HIDA scan for further evaluation.   Spoke with Dr. Eletha with General Surgery. Agrees with plan for medicine admit for HIDA and they can consult.   10:15 AM Discussed patient's case with [facility], Dr. Roxane to request admission. Patient and family (if present) updated with plan.   I reviewed all nursing notes, vitals, pertinent old records, EKGs, labs, imaging (as available).    Darra Fonda MATSU, MD 11/16/23 [phone]

## 2023-11-16 NOTE — Progress Notes (Signed)
 I went to pt room to inquire about using the CPAP machine tonight.  Pt stated that he does not use CPAP anymore.  He states he has lost 45lbs and since then has not used the machine in the past 4-5 years. Will continue to monitor pt and treat accordingly.

## 2023-11-16 NOTE — ED Notes (Signed)
Brother is at bedside.

## 2023-11-16 NOTE — ED Provider Notes (Signed)
 [facility] Provider Note  CSN: [id number] Arrival date & time: 11/15/23 2247  Chief Complaint(s) Abdominal Pain  HPI [name] is a 58 y.o. male     Abdominal Pain Pain location:  Epigastric Pain quality: aching   Pain radiates to:  Periumbilical region Pain severity:  Moderate Onset quality:  Sudden Duration:  5 hours Timing:  Constant Progression:  Resolved Chronicity:  New Context: eating   Relieved by:  Vomiting Worsened by:  Nothing Associated symptoms: chills, nausea, shortness of breath (hurt to breath) and vomiting   Associated symptoms: no chest pain and no fever     Reports having lumbar steroid injection 1 week ago. Also reports ppx Abx for lumbar hardware infection resulting in bacteremia. Lasted blood cultures negative.    Past Medical History Past Medical History:  Diagnosis Date   Abdominal pain 01/25/2021   DDD (degenerative disc disease), lumbar    Diabetes mellitus    Hyperlipemia    Hypertension    Obesity    OSA (obstructive sleep apnea)    on CPAP   Patient Active Problem List   Diagnosis Date Noted   Other chronic pain 10/26/2023   Medication monitoring encounter 06/18/2023   Medication management 02/05/2023   Morbid obesity (HCC) 06/09/2022   Myofascial pain 08/14/2021   Hardware complicating wound infection (HCC) 03/13/2021   Slow transit constipation    Disto-occlusion    Lumbar discitis    Epidural abscess    Hypoalbuminemia due to protein-calorie malnutrition (HCC)    Other chronic postprocedural pain    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   Klebsiella pneumoniae infection 01/28/2021   Wound infection after surgery 01/25/2021   Muscle spasms of both lower extremities 01/25/2021   Spleen enlarged 01/25/2021   Lumbar radiculopathy 01/11/2021   Neuropathy 09/28/2020   Chronic pain syndrome 09/28/2020   Intervertebral lumbar disc disorder with myelopathy, lumbar  region 09/28/2020   Spondylosis, cervical, with myelopathy 09/28/2020   PCP NOTES >>>>>>>> 07/09/2015   DJD (degenerative joint disease) 10/27/2014   OSA (obstructive sleep apnea) 06/14/2012   ED (erectile dysfunction) 04/26/2012   Annual physical exam 12/29/2011   Achilles tendinitis 09/10/2010   Allergic rhinitis 07/12/2010   Hyperlipidemia 04/26/2007   DM II (diabetes mellitus, type II), controlled (HCC) 04/22/2006   Essential hypertension 04/22/2006   Home Medication(s) Prior to Admission medications   Medication Sig Start Date End Date Taking? Authorizing Provider  ascorbic acid  (VITAMIN C) 500 MG tablet Take 1 tablet (500 mg total) by mouth 2 (two) times daily. Patient taking differently: Take 500 mg by mouth daily. 04/09/21   Lovorn, Megan, MD  Blood Glucose Monitoring Suppl (FREESTYLE LITE) w/Device KIT Use to check blood sugar up to 4 times daily as directed 11/12/22   Paz, Jose E, MD  cefadroxil  (DURICEF) 500 MG capsule Take 1 capsule (500 mg total) by mouth 2 (two) times daily. 12/20/23   Manandhar, Sabina, MD  cholecalciferol (VITAMIN D3) 25 MCG (1000 UNIT) tablet Take 1,000 Units by mouth daily.    [provider]  famotidine-calcium  carbonate-magnesium  hydroxide (PEPCID COMPLETE) 10-800-165 MG chewable tablet Chew 1 tablet by mouth daily as needed.    [provider]  fluticasone  (FLONASE ) 50 MCG/ACT nasal spray Place 2 sprays into both nostrils daily as needed. 03/08/21   [provider]  glucose blood (FREESTYLE LITE) test strip Use 1 strip to check blood sugar up to 4 times daily as directed 10/10/21  Amon Aloysius BRAVO, MD  Lancets (FREESTYLE) lancets Use to check blood sugar up to 4 (four) times daily as directed 11/14/22   Paz, Jose E, MD  losartan -hydrochlorothiazide  (HYZAAR ) 50-12.5 MG tablet Take 1 tablet by mouth daily. 06/02/23   Paz, Jose E, MD  metFORMIN  (GLUCOPHAGE ) 1000 MG tablet Take 1 tablet (1,000 mg total) by mouth 2 (two) times daily with a  meal. 09/03/23   Amon Aloysius BRAVO, MD  morphine  (MS CONTIN ) 15 MG 12 hr tablet Take 1 tablet (15 mg total) by mouth every 12 (twelve) hours 05/27/23   Lovorn, Megan, MD  morphine  (MS CONTIN ) 15 MG 12 hr tablet Take 1 tablet (15 mg total) by mouth every 12 (twelve) hours for chronic pain. 08/28/23   Lovorn, Megan, MD  naloxone  (NARCAN ) nasal spray 4 mg/0.1 mL Use as directed Patient not taking: Reported on 10/26/2023 05/03/21   Paz, Jose E, MD  Oxycodone  HCl 10 MG TABS Take 1 tablet (10 mg total) by mouth 3 (three) times daily as needed. 10/30/23   Lovorn, Megan, MD  polyethylene glycol (MIRALAX  / GLYCOLAX ) 17 g packet Take 17 g by mouth 2 (two) times daily. 03/08/21   Love, Sharlet RAMAN, PA-C  rosuvastatin  (CRESTOR ) 20 MG tablet Take 1 tablet (20 mg total) by mouth daily. *Make appt with Dr. Pietro for refills [phone]* 10/09/23   Pietro Redell RAMAN, MD  senna (SENOKOT) 8.6 MG TABS tablet Take 3 tablets (25.8 mg total) by mouth 2 (two) times daily. 02/20/21   Love, Sharlet RAMAN, PA-C  simethicone  (MYLICON) 80 MG chewable tablet Chew 1 tablet (80 mg total) by mouth 4 (four) times daily as needed for flatulence. 03/08/21   Love, Sharlet RAMAN, PA-C  tadalafil  (CIALIS ) 20 MG tablet Take 0.5-1 tablets (10-20 mg total) by mouth every other day as needed for erectile dysfunction. 10/02/22   Amon Aloysius BRAVO, MD  tamsulosin  (FLOMAX ) 0.4 MG CAPS capsule Take 1 capsule (0.4 mg total) by mouth at bedtime. 09/29/23     tiZANidine  (ZANAFLEX ) 4 MG tablet Take 1 tablet (4 mg total) by mouth 2 (two) times daily. Patient taking differently: Take 4 mg by mouth in the morning and at bedtime. AS NEEDED 09/15/22   Lovorn, Megan, MD  Zinc  Sulfate 220 (50 Zn) MG TABS Take 1 tablet (220 mg total) by mouth daily. 05/16/21   Lovorn, Megan, MD                                                                                                                                    Allergies Penicillins and Duloxetine  hcl  Review of Systems Review of Systems   Constitutional:  Positive for chills. Negative for fever.  Respiratory:  Positive for shortness of breath (hurt to breath).   Cardiovascular:  Negative for chest pain.  Gastrointestinal:  Positive for abdominal pain, nausea and vomiting.   As noted in HPI  Physical Exam Vital Signs  I have reviewed the triage vital signs BP 100/68   Pulse (!) 102   Temp (!) 97.4 F (36.3 C) (Oral)   Resp 15   Ht 6' 2 (1.88 m)   Wt (!) 138.3 kg   SpO2 98%   BMI 39.16 kg/m   Physical Exam Vitals reviewed.  Constitutional:      General: He is not in acute distress.    Appearance: He is well-developed. He is obese. He is not diaphoretic.  HENT:     Head: Normocephalic and atraumatic.     Right Ear: External ear normal.     Left Ear: External ear normal.     Nose: Nose normal.     Mouth/Throat:     Mouth: Mucous membranes are moist.  Eyes:     General: No scleral icterus.    Conjunctiva/sclera: Conjunctivae normal.  Neck:     Trachea: Phonation normal.  Cardiovascular:     Rate and Rhythm: Normal rate and regular rhythm.  Pulmonary:     Effort: Pulmonary effort is normal. No respiratory distress.     Breath sounds: No stridor.  Abdominal:     General: There is no distension.     Tenderness: There is no abdominal tenderness.  Musculoskeletal:        General: Normal range of motion.     Cervical back: Normal range of motion.  Neurological:     Mental Status: He is alert and oriented to person, place, and time.  Psychiatric:        Behavior: Behavior normal.     ED Results and Treatments Labs (all labs ordered are listed, but only abnormal results are displayed) Labs Reviewed  COMPREHENSIVE METABOLIC PANEL WITH GFR - Abnormal; Notable for the following components:      Result Value   Chloride 96 (*)    Glucose, Bld 196 (*)    BUN 21 (*)    Creatinine, Ser 1.34 (*)    AST 64 (*)    Total Bilirubin 1.6 (*)    Anion gap 19 (*)    All other components within normal limits   CBC WITH DIFFERENTIAL/PLATELET - Abnormal; Notable for the following components:   WBC 17.5 (*)    Neutro Abs 15.8 (*)    Abs Immature Granulocytes 0.09 (*)    All other components within normal limits  LIPASE, BLOOD  URINALYSIS, ROUTINE W REFLEX MICROSCOPIC  TROPONIN T, HIGH SENSITIVITY  TROPONIN T, HIGH SENSITIVITY                                                                                                                         EKG  EKG Interpretation Date/Time:  Sunday November 15 2023 23:05:13 EDT Ventricular Rate:  115 PR Interval:  142 QRS Duration:  146 QT Interval:  351 QTC Calculation: 486 R Axis:   167  Text Interpretation: Sinus tachycardia RBBB and LPFB Inferolateral infarct, recent No significant change since last tracing Confirmed by Kyley Solow,  [place] [phone]) on 11/15/2023 11:08:00 PM       Radiology CT ABDOMEN PELVIS W CONTRAST Result Date: 11/16/2023 CLINICAL DATA:  Epigastric pain EXAM: CT ABDOMEN AND PELVIS WITH CONTRAST TECHNIQUE: Multidetector CT imaging of the abdomen and pelvis was performed using the standard protocol following bolus administration of intravenous contrast. RADIATION DOSE REDUCTION: This exam was performed according to the departmental dose-optimization program which includes automated exposure control, adjustment of the mA and/or kV according to patient size and/or use of iterative reconstruction technique. CONTRAST:  OMNIPAQUE  IOHEXOL  300 MG/ML  SOLN COMPARISON:  CT 01/22/2021, 06/18/2022 FINDINGS: Lower chest: Lung bases demonstrate no acute airspace disease. Coronary vascular calcification Hepatobiliary: Hepatic steatosis. No calcified gallstones however diffuse gallbladder wall thickening with enhancement and probable pericholecystic fluid. No biliary dilatation Pancreas: Unremarkable. No pancreatic ductal dilatation or surrounding inflammatory changes. Spleen: Appears enlarged, craniocaudal measurement of 15 cm Adrenals/Urinary Tract:  Adrenal glands are normal. Kidneys show no hydronephrosis. Bladder is normal. No significant excretion of contrast on delayed views Stomach/Bowel: Stomach nonenlarged. No dilated small bowel. No acute bowel wall thickening. Vascular/Lymphatic: Aortic atherosclerosis. No enlarged abdominal or pelvic lymph nodes. Reproductive: Prostate appears slightly enlarged Other: Negative for pelvic effusion or free air. Musculoskeletal: Posterior spinal fusion hardware L1 through L5 with pedicle screw fracture at S1 is noted on CT from March. No acute osseous abnormality IMPRESSION: 1. Diffuse gallbladder wall thickening with wall enhancement and probable pericholecystic fluid. No calcified gallstones are seen. Findings raise concern for acute cholecystitis. Recommend correlation with ultrasound. 2. Hepatic steatosis. 3. Splenomegaly. 4. Aortic atherosclerosis. Aortic Atherosclerosis (ICD10-I70.0). Electronically Signed   By: Luke Bun M.D.   On: 11/16/2023 02:02    Medications Ordered in ED Medications  HYDROmorphone  (DILAUDID ) injection 0.5 mg (0.5 mg Intravenous Given 11/16/23 0047)  sodium chloride  0.9 % bolus 1,000 mL (0 mLs Intravenous Stopped 11/16/23 0444)  metoCLOPramide  (REGLAN ) injection 10 mg (10 mg Intravenous Given 11/16/23 0046)  iohexol  (OMNIPAQUE ) 300 MG/ML solution 100 mL (100 mLs Intravenous Contrast Given 11/16/23 0151)   Procedures Procedures  (including critical care time) Medical Decision Making / ED Course   Medical Decision Making Amount and/or Complexity of Data Reviewed Labs: ordered. Decision-making details documented in ED Course. Radiology: ordered and independent interpretation performed. Decision-making details documented in ED Course. ECG/medicine tests: ordered and independent interpretation performed. Decision-making details documented in ED Course.  Risk Prescription drug management. Parenteral controlled substances. Decision regarding hospitalization.    Epigastric  abdominal pain Differential diagnosis considered.  Workup below.  EKG without acute ischemic changes.  Initial troponin negative.  Delta troponin negative.  Pain is highly atypical for ACS and feel this is sufficient to rule that out.  CBC with leukocytosis which may be secondary to infection or possibly epidural steroid injection.  No anemia. CMP without significant electrolyte derangements.  He does have hyperglycemia without DKA.  Baseline renal function compared to most recent labs.  AST and bilirubin slightly elevated Normal lipase ruling out pancreatitis. CT scan notable for diffuse gallbladder wall thickening with pericholecystic fluid.  No obvious stones seen. Will obtain ultrasound to better characterize the patient is currently pain-free making acute cholecystitis less likely. Possible passed stone.  No other serious intra-abdominal inflammatory/infectious process or bowel obstruction.  Doubt acute aortic process.  Patient care turned over to oncoming provider. Patient case and results discussed in detail; please see their note for further ED managment.       Final Clinical Impression(s) / ED Diagnoses Final diagnoses:  None  This chart was dictated using voice recognition software.  Despite best efforts to proofread,  errors can occur which can change the documentation meaning.    Trine Raynell Moder, MD 11/16/23 [phone]

## 2023-11-16 NOTE — Consult Note (Signed)
 [name] 04-10-1966  [id number].    Requesting MD: Dr. Katha Gail Chief Complaint/Reason for Consult: cholecystitis  HPI:  This is a pleasant 58 yo white male with a history of post op hardware infection of his back in 2022 followed by ID, DM, HTN, OSA, HLD, and obesity who had steak, fries, and salad last night for dinner.  He then developed epigastric abdominal pain around 1930.  He had 2 episodes of N/V.  His pain was significant enough that he presented to the [facility] ED.  His pain ceased around 2300 last night and he has felt better since then.  However, he has a WBC of 17.5, TB of 1.6, and AST of 64.  His CT was concerning for cholecystitis, but his US  was equivocal, but with stones.  It was recommended he be admitted for a HIDA scan and further evaluation.  We have been asked to see him.  ROS: ROS: see HPI  Family History  Problem Relation Age of Onset   Diabetes Mother    Hypertension Neg Hx    Coronary artery disease Neg Hx    Colon cancer Neg Hx    Prostate cancer Neg Hx     Past Medical History:  Diagnosis Date   Abdominal pain 01/25/2021   DDD (degenerative disc disease), lumbar    Diabetes mellitus    Hyperlipemia    Hypertension    Obesity    OSA (obstructive sleep apnea)    on CPAP    Past Surgical History:  Procedure Laterality Date   APPLICATION OF ROBOTIC ASSISTANCE FOR SPINAL PROCEDURE N/A 01/11/2021   Procedure: APPLICATION OF ROBOTIC ASSISTANCE FOR SPINAL PROCEDURE;  Surgeon: Cheryle Debby LABOR, MD;  Location: [facility] OR;  Service: Neurosurgery;  Laterality: N/A;   APPLICATION OF WOUND VAC N/A 01/25/2021   Procedure: APPLICATION OF WOUND VAC;  Surgeon: Dawley, Lani BROCKS, DO;  Location: [facility] OR;  Service: Neurosurgery;  Laterality: N/A;   BACK SURGERY     COLONOSCOPY  2019   LUMBAR WOUND DEBRIDEMENT N/A 01/25/2021   Procedure: Irrigation and Debridement of lumbar wound, and wound vacuum assisted closure;  Surgeon: Dawley, Lani BROCKS, DO;  Location: [facility] OR;   Service: Neurosurgery;  Laterality: N/A;   TRANSFORAMINAL LUMBAR INTERBODY FUSION (TLIF) WITH PEDICLE SCREW FIXATION 4 LEVEL N/A 01/11/2021   Procedure: Lumbar one-two, Lumbar two-three, Lumbar three-four, Lumbar four-five, Lumbar five Sacral one Open decompression, Transforaminal lumbar interbody fusion, posterolateral instrumented fusion;  Surgeon: Cheryle Debby LABOR, MD;  Location: [facility] OR;  Service: Neurosurgery;  Laterality: N/A;    Social History:  reports that he has never smoked. He has never used smokeless tobacco. He reports that he does not drink alcohol and does not use drugs.  Allergies:  Allergies  Allergen Reactions   Penicillins Other (See Comments) and Swelling    REACTION: Blister and Sores in the mouth  Blisters in mouth, REACTION: Blister and Sores in the mouth   Duloxetine  Hcl Other (See Comments)    Other Reaction(s): GI Intolerance    Medications Prior to Admission  Medication Sig Dispense Refill   ascorbic acid  (VITAMIN C) 500 MG tablet Take 1 tablet (500 mg total) by mouth 2 (two) times daily. (Patient taking differently: Take 500 mg by mouth daily.) 100 tablet 3   Blood Glucose Monitoring Suppl (FREESTYLE LITE) w/Device KIT Use to check blood sugar up to 4 times daily as directed 1 kit 0   [START ON 12/20/2023] cefadroxil  (DURICEF) 500 MG  capsule Take 1 capsule (500 mg total) by mouth 2 (two) times daily. 60 capsule 5   cholecalciferol (VITAMIN D3) 25 MCG (1000 UNIT) tablet Take 1,000 Units by mouth daily.     famotidine-calcium  carbonate-magnesium  hydroxide (PEPCID COMPLETE) 10-800-165 MG chewable tablet Chew 1 tablet by mouth daily as needed.     fluticasone  (FLONASE ) 50 MCG/ACT nasal spray Place 2 sprays into both nostrils daily as needed.     glucose blood (FREESTYLE LITE) test strip Use 1 strip to check blood sugar up to 4 times daily as directed 200 each 0   Lancets (FREESTYLE) lancets Use to check blood sugar up to 4 (four) times daily as directed 200 each 0    losartan -hydrochlorothiazide  (HYZAAR ) 50-12.5 MG tablet Take 1 tablet by mouth daily. 90 tablet 1   metFORMIN  (GLUCOPHAGE ) 1000 MG tablet Take 1 tablet (1,000 mg total) by mouth 2 (two) times daily with a meal. 180 tablet 1   morphine  (MS CONTIN ) 15 MG 12 hr tablet Take 1 tablet (15 mg total) by mouth every 12 (twelve) hours 60 tablet 0   morphine  (MS CONTIN ) 15 MG 12 hr tablet Take 1 tablet (15 mg total) by mouth every 12 (twelve) hours for chronic pain. 60 tablet 0   naloxone  (NARCAN ) nasal spray 4 mg/0.1 mL Use as directed (Patient not taking: Reported on 10/26/2023) 2 each 1   Oxycodone  HCl 10 MG TABS Take 1 tablet (10 mg total) by mouth 3 (three) times daily as needed. 90 tablet 0   polyethylene glycol (MIRALAX  / GLYCOLAX ) 17 g packet Take 17 g by mouth 2 (two) times daily. 14 each 0   rosuvastatin  (CRESTOR ) 20 MG tablet Take 1 tablet (20 mg total) by mouth daily. *Make appt with Dr. Pietro for refills [phone]* 15 tablet 0   senna (SENOKOT) 8.6 MG TABS tablet Take 3 tablets (25.8 mg total) by mouth 2 (two) times daily. 270 tablet 0   simethicone  (MYLICON) 80 MG chewable tablet Chew 1 tablet (80 mg total) by mouth 4 (four) times daily as needed for flatulence. 60 tablet 0   tadalafil  (CIALIS ) 20 MG tablet Take 0.5-1 tablets (10-20 mg total) by mouth every other day as needed for erectile dysfunction. 30 tablet 3   tamsulosin  (FLOMAX ) 0.4 MG CAPS capsule Take 1 capsule (0.4 mg total) by mouth at bedtime. 30 capsule 5   tiZANidine  (ZANAFLEX ) 4 MG tablet Take 1 tablet (4 mg total) by mouth 2 (two) times daily. (Patient taking differently: Take 4 mg by mouth in the morning and at bedtime. AS NEEDED) 60 tablet 5   Zinc  Sulfate 220 (50 Zn) MG TABS Take 1 tablet (220 mg total) by mouth daily. 100 tablet 5     Physical Exam: Blood pressure 138/82, pulse 86, temperature 97.9 F (36.6 C), resp. rate 18, height 6' 2 (1.88 m), weight (!) 138.3 kg, SpO2 98%. General: pleasant, WD, WN, obese white  male who is laying in bed in NAD HEENT: head is normocephalic, atraumatic.  Sclera are noninjected.  PERRL.  Ears and nose without any masses or lesions.  Mouth is pink and moist Heart: regular, rate, and rhythm.  Normal s1,s2. No obvious murmurs, gallops, or rubs noted.  Palpable radial and pedal pulses bilaterally Lungs: CTAB, no wheezes, rhonchi, or rales noted.  Respiratory effort nonlabored Abd: soft, NT, ND, morbidly obese, +BS, no masses, hernias, or organomegaly Psych: A&Ox3 with an appropriate affect.   Results for orders placed or performed during the hospital encounter  of 11/15/23 (from the past 48 hours)  Lipase, blood     Status: None   Collection Time: 11/15/23 11:13 PM  Result Value Ref Range   Lipase 25 11 - 51 U/L    Comment: Performed at [facility], [address] [place], [place] [address]  Comprehensive metabolic panel     Status: Abnormal   Collection Time: 11/15/23 11:13 PM  Result Value Ref Range   Sodium 137 135 - 145 mmol/L   Potassium 3.8 3.5 - 5.1 mmol/L   Chloride 96 (L) 98 - 111 mmol/L   CO2 23 22 - 32 mmol/L   Glucose, Bld 196 (H) 70 - 99 mg/dL    Comment: Glucose reference range applies only to samples taken after fasting for at least 8 hours.   BUN 21 (H) 6 - 20 mg/dL   Creatinine, Ser 8.65 (H) 0.61 - 1.24 mg/dL   Calcium  9.7 8.9 - 10.3 mg/dL   Total Protein 7.2 6.5 - 8.1 g/dL   Albumin  4.6 3.5 - 5.0 g/dL   AST 64 (H) 15 - 41 U/L   ALT 36 0 - 44 U/L   Alkaline Phosphatase 84 38 - 126 U/L   Total Bilirubin 1.6 (H) 0.0 - 1.2 mg/dL   GFR, Estimated >39 >39 mL/min    Comment: (NOTE) Calculated using the CKD-EPI Creatinine Equation (2021)    Anion gap 19 (H) 5 - 15    Comment: Performed at [facility], [address] [place], [place] [address]  CBC with Differential     Status: Abnormal   Collection Time: 11/15/23 11:13 PM  Result Value Ref Range   WBC 17.5 (H) 4.0 - 10.5 K/uL   RBC 5.38 4.22 - 5.81 MIL/uL   Hemoglobin  16.1 13.0 - 17.0 g/dL   HCT 52.8 60.9 - 47.9 %   MCV 87.5 80.0 - 100.0 fL   MCH 29.9 26.0 - 34.0 pg   MCHC 34.2 30.0 - 36.0 g/dL   RDW 86.0 88.4 - 84.4 %   Platelets 266 150 - 400 K/uL   nRBC 0.0 0.0 - 0.2 %   Neutrophils Relative % 90 %   Neutro Abs 15.8 (H) 1.7 - 7.7 K/uL   Lymphocytes Relative 4 %   Lymphs Abs 0.7 0.7 - 4.0 K/uL   Monocytes Relative 5 %   Monocytes Absolute 0.8 0.1 - 1.0 K/uL   Eosinophils Relative 0 %   Eosinophils Absolute 0.0 0.0 - 0.5 K/uL   Basophils Relative 0 %   Basophils Absolute 0.1 0.0 - 0.1 K/uL   Immature Granulocytes 1 %   Abs Immature Granulocytes 0.09 (H) 0.00 - 0.07 K/uL    Comment: Performed at [facility], [address] [place], [place] [address]  Troponin T, High Sensitivity     Status: None   Collection Time: 11/15/23 11:13 PM  Result Value Ref Range   Troponin T High Sensitivity <15 <19 ng/L    Comment: (NOTE) Biotin concentrations > 1000 ng/mL falsely decrease TnT results.  Serial cardiac troponin measurements are suggested.  Refer to the Links section for chest pain algorithms and additional  guidance. Performed at [facility], [address] [place], [place] [address]   Urinalysis, Routine w reflex microscopic -Urine, Clean Catch     Status: None   Collection Time: 11/16/23  1:58 AM  Result Value Ref Range   Color, Urine YELLOW YELLOW   APPearance  CLEAR CLEAR   Specific Gravity, Urine >=1.030 1.005 - 1.030   pH 5.5 5.0 - 8.0   Glucose, UA NEGATIVE NEGATIVE mg/dL   Hgb urine dipstick NEGATIVE NEGATIVE   Bilirubin Urine NEGATIVE NEGATIVE   Ketones, ur NEGATIVE NEGATIVE mg/dL   Protein, ur NEGATIVE NEGATIVE mg/dL   Nitrite NEGATIVE NEGATIVE   Leukocytes,Ua NEGATIVE NEGATIVE    Comment: Microscopic not done on urines with negative protein, blood, leukocytes, nitrite, or glucose < 500 mg/dL. Performed at [facility], [address] [place], [place] [address]   Troponin T, High  Sensitivity     Status: None   Collection Time: 11/16/23  1:58 AM  Result Value Ref Range   Troponin T High Sensitivity 16 <19 ng/L    Comment: (NOTE) Biotin concentrations > 1000 ng/mL falsely decrease TnT results.  Serial cardiac troponin measurements are suggested.  Refer to the Links section for chest pain algorithms and additional  guidance. Performed at [facility], [address] [place], [place] [address]   Glucose, capillary     Status: Abnormal   Collection Time: 11/16/23 12:59 PM  Result Value Ref Range   Glucose-Capillary 122 (H) 70 - 99 mg/dL    Comment: Glucose reference range applies only to samples taken after fasting for at least 8 hours.   Comment 1 Notify RN    Comment 2 Document in Chart    US  Abdomen Limited RUQ (LIVER/GB) Result Date: 11/16/2023 CLINICAL DATA:  Epigastric pain with gallbladder wall thickening and pericholecystic fluid on CT. EXAM: ULTRASOUND ABDOMEN LIMITED RIGHT UPPER QUADRANT COMPARISON:  CT 11/16/2023 FINDINGS: Gallbladder: Small gallstones, including stones at the gallbladder neck. As seen on CT, there is moderate gallbladder wall thickening with a small amount of pericholecystic fluid. Negative sonographic Murphy sign according to the sonographer. Common bile duct: Diameter: Not measured due to patient nausea and vomiting. No evidence of intra or extrahepatic biliary dilatation as correlated with same day CT. Liver: No focal lesion identified. Within normal limits in parenchymal echogenicity. Portal vein is patent on color Doppler imaging with normal direction of blood flow towards the liver. Other: None. IMPRESSION: Cholelithiasis with gallbladder wall thickening and pericholecystic fluid but no sonographic Murphy sign. Findings are indeterminate for acute cholecystitis. Consider nuclear medicine hepatobiliary scan. Electronically Signed   By: Elsie Perone M.D.   On: 11/16/2023 08:56   CT ABDOMEN PELVIS W CONTRAST Result Date:  11/16/2023 CLINICAL DATA:  Epigastric pain EXAM: CT ABDOMEN AND PELVIS WITH CONTRAST TECHNIQUE: Multidetector CT imaging of the abdomen and pelvis was performed using the standard protocol following bolus administration of intravenous contrast. RADIATION DOSE REDUCTION: This exam was performed according to the departmental dose-optimization program which includes automated exposure control, adjustment of the mA and/or kV according to patient size and/or use of iterative reconstruction technique. CONTRAST:  OMNIPAQUE  IOHEXOL  300 MG/ML  SOLN COMPARISON:  CT 01/22/2021, 06/18/2022 FINDINGS: Lower chest: Lung bases demonstrate no acute airspace disease. Coronary vascular calcification Hepatobiliary: Hepatic steatosis. No calcified gallstones however diffuse gallbladder wall thickening with enhancement and probable pericholecystic fluid. No biliary dilatation Pancreas: Unremarkable. No pancreatic ductal dilatation or surrounding inflammatory changes. Spleen: Appears enlarged, craniocaudal measurement of 15 cm Adrenals/Urinary Tract: Adrenal glands are normal. Kidneys show no hydronephrosis. Bladder is normal. No significant excretion of contrast on delayed views Stomach/Bowel: Stomach nonenlarged. No dilated small bowel. No acute bowel wall thickening. Vascular/Lymphatic: Aortic atherosclerosis. No enlarged abdominal or pelvic lymph nodes. Reproductive: Prostate appears slightly  enlarged Other: Negative for pelvic effusion or free air. Musculoskeletal: Posterior spinal fusion hardware L1 through L5 with pedicle screw fracture at S1 is noted on CT from March. No acute osseous abnormality IMPRESSION: 1. Diffuse gallbladder wall thickening with wall enhancement and probable pericholecystic fluid. No calcified gallstones are seen. Findings raise concern for acute cholecystitis. Recommend correlation with ultrasound. 2. Hepatic steatosis. 3. Splenomegaly. 4. Aortic atherosclerosis. Aortic Atherosclerosis (ICD10-I70.0).  Electronically Signed   By: Luke Bun M.D.   On: 11/16/2023 02:02      Assessment/Plan Cholecystitis The patient has been seen, examined, labs, vitals, chart, and imaging personally reviewed.  The patient is currently asymptomatic but his CT scan certainly shows some inflammation around his gallbladder and his WBC is elevated yesterday up to 17.5K.  He has a HIDA scan ordered.  We will follow up on this.  If this is positive, we will plan for lap chole.  If this is negative, it is still likely that he had biliary colic and we could discuss surgical intervention still.  He may have CLD when he returns from his HIDA scan, but then NPO p MN for possible OR tomorrow.     FEN - NPO for HIDA, may have CLD after and NPO p MN VTE - ok for chemical prophylaxis from our standpoint ID - Rocephin , based on imaging and WBC elevation  Obesity HTN HLD DM OSA  I reviewed ED provider notes, hospitalist notes, last 24 h vitals and pain scores, last 24 h labs and trends, and last 24 h imaging results.  Burnard FORBES Banter, PA-C [facility] Surgery 11/16/2023, 1:10 PM Please see Amion for pager number during day hours 7:00am-4:30pm or 7:00am -11:30am on weekends

## 2023-11-16 NOTE — H&P (Signed)
 History and Physical  [name] FMW:[id number] DOB: 1965-06-08 DOA: 11/15/2023  PCP: Amon Aloysius FORBES, MD   Chief Complaint: Abdominal pain, vomiting  HPI: [name] is a 58 y.o. male with medical history significant for DM2, HTN, hyperlipidemia, obesity, OSA admitted to the hospital with sudden onset of abdominal pain, vomiting and concern for acute cholecystitis.  Patient states he has been in his usual state of health, last night after dinner he had sudden onset of burning epigastric abdominal pain, associated vomiting.  Denies any change in bowel habits, any fevers.  He is on suppressive antibiotics due to her history of surgical hardware in his back and osteomyelitis/discitis.  He has not been sick at all until last evening with the symptoms named above.  Review of Systems: Please see HPI for pertinent positives and negatives. A complete 10 system review of systems are otherwise negative.  Past Medical History:  Diagnosis Date   Abdominal pain 01/25/2021   DDD (degenerative disc disease), lumbar    Diabetes mellitus    Hyperlipemia    Hypertension    Obesity    OSA (obstructive sleep apnea)    on CPAP   Past Surgical History:  Procedure Laterality Date   APPLICATION OF ROBOTIC ASSISTANCE FOR SPINAL PROCEDURE N/A 01/11/2021   Procedure: APPLICATION OF ROBOTIC ASSISTANCE FOR SPINAL PROCEDURE;  Surgeon: Cheryle Debby LABOR, MD;  Location: [facility] OR;  Service: Neurosurgery;  Laterality: N/A;   APPLICATION OF WOUND VAC N/A 01/25/2021   Procedure: APPLICATION OF WOUND VAC;  Surgeon: Dawley, Lani BROCKS, DO;  Location: [facility] OR;  Service: Neurosurgery;  Laterality: N/A;   BACK SURGERY     COLONOSCOPY  2019   LUMBAR WOUND DEBRIDEMENT N/A 01/25/2021   Procedure: Irrigation and Debridement of lumbar wound, and wound vacuum assisted closure;  Surgeon: Dawley, Lani BROCKS, DO;  Location: [facility] OR;  Service: Neurosurgery;  Laterality: N/A;   TRANSFORAMINAL LUMBAR INTERBODY FUSION (TLIF) WITH PEDICLE SCREW  FIXATION 4 LEVEL N/A 01/11/2021   Procedure: Lumbar one-two, Lumbar two-three, Lumbar three-four, Lumbar four-five, Lumbar five Sacral one Open decompression, Transforaminal lumbar interbody fusion, posterolateral instrumented fusion;  Surgeon: Cheryle Debby LABOR, MD;  Location: [facility] OR;  Service: Neurosurgery;  Laterality: N/A;   Social History:  reports that he has never smoked. He has never used smokeless tobacco. He reports that he does not drink alcohol and does not use drugs.  Allergies  Allergen Reactions   Penicillins Other (See Comments) and Swelling    REACTION: Blister and Sores in the mouth  Blisters in mouth, REACTION: Blister and Sores in the mouth   Duloxetine  Hcl Other (See Comments)    Other Reaction(s): GI Intolerance    Family History  Problem Relation Age of Onset   Diabetes Mother    Hypertension Neg Hx    Coronary artery disease Neg Hx    Colon cancer Neg Hx    Prostate cancer Neg Hx      Prior to Admission medications   Medication Sig Start Date End Date Taking? Authorizing Provider  ascorbic acid  (VITAMIN C) 500 MG tablet Take 1 tablet (500 mg total) by mouth 2 (two) times daily. Patient taking differently: Take 500 mg by mouth daily. 04/09/21   Lovorn, Megan, MD  Blood Glucose Monitoring Suppl (FREESTYLE LITE) w/Device KIT Use to check blood sugar up to 4 times daily as directed 11/12/22   Paz, Jose E, MD  cefadroxil  (DURICEF) 500 MG capsule Take 1 capsule (500 mg total) by mouth  2 (two) times daily. 12/20/23   Manandhar, Sabina, MD  cholecalciferol (VITAMIN D3) 25 MCG (1000 UNIT) tablet Take 1,000 Units by mouth daily.    [provider]  famotidine-calcium  carbonate-magnesium  hydroxide (PEPCID COMPLETE) 10-800-165 MG chewable tablet Chew 1 tablet by mouth daily as needed.    [provider]  fluticasone  (FLONASE ) 50 MCG/ACT nasal spray Place 2 sprays into both nostrils daily as needed. 03/08/21   [provider]  glucose blood  (FREESTYLE LITE) test strip Use 1 strip to check blood sugar up to 4 times daily as directed 10/10/21   Amon Aloysius BRAVO, MD  Lancets (FREESTYLE) lancets Use to check blood sugar up to 4 (four) times daily as directed 11/14/22   Paz, Jose E, MD  losartan -hydrochlorothiazide  (HYZAAR ) 50-12.5 MG tablet Take 1 tablet by mouth daily. 06/02/23   Paz, Jose E, MD  metFORMIN  (GLUCOPHAGE ) 1000 MG tablet Take 1 tablet (1,000 mg total) by mouth 2 (two) times daily with a meal. 09/03/23   Amon Aloysius BRAVO, MD  morphine  (MS CONTIN ) 15 MG 12 hr tablet Take 1 tablet (15 mg total) by mouth every 12 (twelve) hours 05/27/23   Lovorn, Megan, MD  morphine  (MS CONTIN ) 15 MG 12 hr tablet Take 1 tablet (15 mg total) by mouth every 12 (twelve) hours for chronic pain. 08/28/23   Lovorn, Megan, MD  naloxone  (NARCAN ) nasal spray 4 mg/0.1 mL Use as directed Patient not taking: Reported on 10/26/2023 05/03/21   Paz, Jose E, MD  Oxycodone  HCl 10 MG TABS Take 1 tablet (10 mg total) by mouth 3 (three) times daily as needed. 10/30/23   Lovorn, Megan, MD  polyethylene glycol (MIRALAX  / GLYCOLAX ) 17 g packet Take 17 g by mouth 2 (two) times daily. 03/08/21   Love, Sharlet RAMAN, PA-C  rosuvastatin  (CRESTOR ) 20 MG tablet Take 1 tablet (20 mg total) by mouth daily. *Make appt with Dr. Pietro for refills [phone]* 10/09/23   Pietro Redell RAMAN, MD  senna (SENOKOT) 8.6 MG TABS tablet Take 3 tablets (25.8 mg total) by mouth 2 (two) times daily. 02/20/21   Love, Sharlet RAMAN, PA-C  simethicone  (MYLICON) 80 MG chewable tablet Chew 1 tablet (80 mg total) by mouth 4 (four) times daily as needed for flatulence. 03/08/21   Love, Sharlet RAMAN, PA-C  tadalafil  (CIALIS ) 20 MG tablet Take 0.5-1 tablets (10-20 mg total) by mouth every other day as needed for erectile dysfunction. 10/02/22   Amon Aloysius BRAVO, MD  tamsulosin  (FLOMAX ) 0.4 MG CAPS capsule Take 1 capsule (0.4 mg total) by mouth at bedtime. 09/29/23     tiZANidine  (ZANAFLEX ) 4 MG tablet Take 1 tablet (4 mg total) by mouth 2  (two) times daily. Patient taking differently: Take 4 mg by mouth in the morning and at bedtime. AS NEEDED 09/15/22   Lovorn, Megan, MD  Zinc  Sulfate 220 (50 Zn) MG TABS Take 1 tablet (220 mg total) by mouth daily. 05/16/21   Lovorn, Megan, MD    Physical Exam: BP 105/88   Pulse 85   Temp 98.1 F (36.7 C) (Oral)   Resp 18   Ht 6' 2 (1.88 m)   Wt (!) 138.3 kg   SpO2 98%   BMI 39.16 kg/m  General:  Alert, oriented, calm, in no acute distress  Eyes: EOMI, clear conjuctivae, white sclerea Neck: supple, no masses, trachea mildline  Cardiovascular: RRR, no murmurs or rubs, no peripheral edema  Respiratory: clear to auscultation bilaterally, no wheezes, no crackles  Abdomen: soft,  tender, nondistended, normal bowel tones heard  Skin: dry, no rashes  Musculoskeletal: no joint effusions, normal range of motion  Psychiatric: appropriate affect, normal speech  Neurologic: extraocular muscles intact, clear speech, moving all extremities with intact sensorium         Labs on Admission:  Basic Metabolic Panel: Recent Labs  Lab 11/15/23 2313  NA 137  K 3.8  CL 96*  CO2 23  GLUCOSE 196*  BUN 21*  CREATININE 1.34*  CALCIUM  9.7   Liver Function Tests: Recent Labs  Lab 11/15/23 2313  AST 64*  ALT 36  ALKPHOS 84  BILITOT 1.6*  PROT 7.2  ALBUMIN  4.6   Recent Labs  Lab 11/15/23 2313  LIPASE 25   No results for input(s): AMMONIA in the last 168 hours. CBC: Recent Labs  Lab 11/15/23 2313  WBC 17.5*  NEUTROABS 15.8*  HGB 16.1  HCT 47.1  MCV 87.5  PLT 266   Cardiac Enzymes: No results for input(s): CKTOTAL, CKMB, CKMBINDEX, TROPONINI in the last 168 hours. BNP (last 3 results) No results for input(s): BNP in the last 8760 hours.  ProBNP (last 3 results) No results for input(s): PROBNP in the last 8760 hours.  CBG: No results for input(s): GLUCAP in the last 168 hours.  Radiological Exams on Admission: US  Abdomen Limited RUQ (LIVER/GB) Result Date:  11/16/2023 CLINICAL DATA:  Epigastric pain with gallbladder wall thickening and pericholecystic fluid on CT. EXAM: ULTRASOUND ABDOMEN LIMITED RIGHT UPPER QUADRANT COMPARISON:  CT 11/16/2023 FINDINGS: Gallbladder: Small gallstones, including stones at the gallbladder neck. As seen on CT, there is moderate gallbladder wall thickening with a small amount of pericholecystic fluid. Negative sonographic Murphy sign according to the sonographer. Common bile duct: Diameter: Not measured due to patient nausea and vomiting. No evidence of intra or extrahepatic biliary dilatation as correlated with same day CT. Liver: No focal lesion identified. Within normal limits in parenchymal echogenicity. Portal vein is patent on color Doppler imaging with normal direction of blood flow towards the liver. Other: None. IMPRESSION: Cholelithiasis with gallbladder wall thickening and pericholecystic fluid but no sonographic Murphy sign. Findings are indeterminate for acute cholecystitis. Consider nuclear medicine hepatobiliary scan. Electronically Signed   By: Elsie Perone M.D.   On: 11/16/2023 08:56   CT ABDOMEN PELVIS W CONTRAST Result Date: 11/16/2023 CLINICAL DATA:  Epigastric pain EXAM: CT ABDOMEN AND PELVIS WITH CONTRAST TECHNIQUE: Multidetector CT imaging of the abdomen and pelvis was performed using the standard protocol following bolus administration of intravenous contrast. RADIATION DOSE REDUCTION: This exam was performed according to the departmental dose-optimization program which includes automated exposure control, adjustment of the mA and/or kV according to patient size and/or use of iterative reconstruction technique. CONTRAST:  OMNIPAQUE  IOHEXOL  300 MG/ML  SOLN COMPARISON:  CT 01/22/2021, 06/18/2022 FINDINGS: Lower chest: Lung bases demonstrate no acute airspace disease. Coronary vascular calcification Hepatobiliary: Hepatic steatosis. No calcified gallstones however diffuse gallbladder wall thickening with  enhancement and probable pericholecystic fluid. No biliary dilatation Pancreas: Unremarkable. No pancreatic ductal dilatation or surrounding inflammatory changes. Spleen: Appears enlarged, craniocaudal measurement of 15 cm Adrenals/Urinary Tract: Adrenal glands are normal. Kidneys show no hydronephrosis. Bladder is normal. No significant excretion of contrast on delayed views Stomach/Bowel: Stomach nonenlarged. No dilated small bowel. No acute bowel wall thickening. Vascular/Lymphatic: Aortic atherosclerosis. No enlarged abdominal or pelvic lymph nodes. Reproductive: Prostate appears slightly enlarged Other: Negative for pelvic effusion or free air. Musculoskeletal: Posterior spinal fusion hardware L1 through L5 with pedicle screw fracture at  S1 is noted on CT from March. No acute osseous abnormality IMPRESSION: 1. Diffuse gallbladder wall thickening with wall enhancement and probable pericholecystic fluid. No calcified gallstones are seen. Findings raise concern for acute cholecystitis. Recommend correlation with ultrasound. 2. Hepatic steatosis. 3. Splenomegaly. 4. Aortic atherosclerosis. Aortic Atherosclerosis (ICD10-I70.0). Electronically Signed   By: Luke Bun M.D.   On: 11/16/2023 02:02   Assessment/Plan [name] is a 58 y.o. male with medical history significant for DM2, HTN, hyperlipidemia, obesity, OSA admitted to the hospital with sudden onset of abdominal pain, vomiting and concern for acute cholecystitis.  Acute cholecystitis-with diffuse abdominal pain, vomiting, gallbladder wall thickening with wall enhancement.  However patient without Murphy sign. -Observation admission -Pain and nausea medication as needed -Has been seen by general surgery -Keep n.p.o. for now -HIDA scan  Type 2 diabetes-hold metformin  while in house, sliding scale insulin  for the time being  History of osteomyelitis/discitis-on suppressive antibiotics per ID -Duricef twice daily  Hypertension-continue  Hyzaar   Chronic back pain-followed by pain management, will continue home pain regimen once confirmed by pharmacy staff  BPH-Flomax   DVT prophylaxis: Lovenox      Code Status: Full Code  Consults called: General Surgery  Admission status: Observation  Time spent: 49 minutes  Vika Buske CHRISTELLA Gail MD [facility] Pager [phone]  If 7PM-7AM, please contact night-coverage [email or web address] Password [email or web address]  11/16/2023, 12:12 PM

## 2023-11-16 NOTE — ED Notes (Signed)
 Called [facility] for transport to [facility] @10 :20am.   Spoke with Debby

## 2023-11-17 ENCOUNTER — Encounter: Payer: Self-pay | Admitting: Infectious Diseases

## 2023-11-17 DIAGNOSIS — K8000 Calculus of gallbladder with acute cholecystitis without obstruction: Secondary | ICD-10-CM | POA: Diagnosis not present

## 2023-11-17 DIAGNOSIS — R109 Unspecified abdominal pain: Secondary | ICD-10-CM

## 2023-11-17 LAB — HIV ANTIBODY (ROUTINE TESTING W REFLEX): HIV Screen 4th Generation wRfx: NONREACTIVE

## 2023-11-17 LAB — CBC
HCT: 44.2 % (ref 39.0–52.0)
Hemoglobin: 14.6 g/dL (ref 13.0–17.0)
MCH: 30.4 pg (ref 26.0–34.0)
MCHC: 33 g/dL (ref 30.0–36.0)
MCV: 91.9 fL (ref 80.0–100.0)
Platelets: 211 K/uL (ref 150–400)
RBC: 4.81 MIL/uL (ref 4.22–5.81)
RDW: 14.1 % (ref 11.5–15.5)
WBC: 9.6 K/uL (ref 4.0–10.5)
nRBC: 0 % (ref 0.0–0.2)

## 2023-11-17 LAB — COMPREHENSIVE METABOLIC PANEL WITH GFR
ALT: 63 U/L — ABNORMAL HIGH (ref 0–44)
AST: 32 U/L (ref 15–41)
Albumin: 3.7 g/dL (ref 3.5–5.0)
Alkaline Phosphatase: 66 U/L (ref 38–126)
Anion gap: 13 (ref 5–15)
BUN: 21 mg/dL — ABNORMAL HIGH (ref 6–20)
CO2: 22 mmol/L (ref 22–32)
Calcium: 9.3 mg/dL (ref 8.9–10.3)
Chloride: 101 mmol/L (ref 98–111)
Creatinine, Ser: 1.26 mg/dL — ABNORMAL HIGH (ref 0.61–1.24)
GFR, Estimated: >60 mL/min (ref >60)
Glucose, Bld: 113 mg/dL — ABNORMAL HIGH (ref 70–99)
Potassium: 3.8 mmol/L (ref 3.5–5.1)
Sodium: 136 mmol/L (ref 135–145)
Total Bilirubin: 1.2 mg/dL (ref 0.0–1.2)
Total Protein: 6.7 g/dL (ref 6.5–8.1)

## 2023-11-17 LAB — GLUCOSE, CAPILLARY
Glucose-Capillary: 107 mg/dL — ABNORMAL HIGH (ref 70–99)
Glucose-Capillary: 119 mg/dL — ABNORMAL HIGH (ref 70–99)
Glucose-Capillary: 127 mg/dL — ABNORMAL HIGH (ref 70–99)

## 2023-11-17 NOTE — Plan of Care (Signed)

## 2023-11-17 NOTE — Discharge Summary (Addendum)
 Physician Discharge Summary   [name] FMW:[id number] DOB: [birth year] DOA: 11/15/2023  PCP: Amon Aloysius FORBES, MD  Admit date: 11/15/2023 Discharge date: 11/17/2023  Admitted From: Home Disposition:  Home Discharging physician: Alm Apo, MD Barriers to discharge: none  Recommendations at discharge: Follow up with general surgery in case of reconsideration by patient for surgery if still indicated    Discharge Condition: stable CODE STATUS: Full  Diet recommendation:  Diet Orders (From admission, onward)     Start     Ordered   11/17/23 0816  Diet Heart Room service appropriate? Yes; Fluid consistency: Thin  Diet effective now       Question Answer Comment  Room service appropriate? Yes   Fluid consistency: Thin      11/17/23 0815   11/17/23 0000  Diet - low sodium heart healthy        11/17/23 0910            Hospital Course: 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 He had just had high content fat meal prior to symptom onset. CT abdomen/pelvis showed diffuse gallbladder wall thickening and potential pericholecystic fluid.  RUQ ultrasound was then performed showing cholelithiasis with gallbladder wall thickening and pericholecystic fluid but findings still considered indeterminate. HIDA scan was then performed which was normal.  General surgery was consulted on admission.  Symptoms were considered due to potential biliary colic and he was offered cholecystectomy but declined during hospitalization.  He was discharged home with outpatient follow-up.    The patient's acute and chronic medical conditions were treated accordingly. On day of discharge, patient was felt deemed stable for discharge. Patient/family member advised to call PCP or come back to ER if needed.   Principal Diagnosis: Abdominal pain  Discharge Diagnoses: Active  Hospital Problems   Diagnosis Date Noted   Hyperlipidemia 04/26/2007   DM II (diabetes mellitus, type II), controlled (HCC) 04/22/2006   Essential hypertension 04/22/2006    Resolved Hospital Problems   Diagnosis Date Noted Date Resolved   Abdominal pain 11/16/2023 11/17/2023    Priority: 1.     Discharge Instructions     Diet - low sodium heart healthy   Complete by: As directed    Increase activity slowly   Complete by: As directed       Allergies as of 11/17/2023       Reactions   Penicillins Swelling, Other (See Comments)   REACTION: Blister and Sores in the mouth Blisters in mouth, REACTION: Blister and Sores in the mouth   Duloxetine  Hcl Other (See Comments)   Other Reaction(s): GI Intolerance        Medication List     TAKE these medications    ascorbic acid  500 MG tablet Commonly known as: VITAMIN C Take 1 tablet (500 mg total) by mouth 2 (two) times daily. What changed: when to take this   cefadroxil  500 MG capsule Commonly known as: DURICEF Take 1 capsule (500 mg total) by mouth 2 (two) times daily. Start taking on: December 20, 2023   cholecalciferol 25 MCG (1000 UNIT) tablet Commonly known as: VITAMIN D3 Take 1,000 Units by mouth daily.   famotidine-calcium  carbonate-magnesium  hydroxide 10-800-165 MG chewable tablet Commonly known as: PEPCID COMPLETE Chew 1 tablet by mouth daily as needed (indigestion).   fluticasone  50 MCG/ACT nasal spray Commonly known as: FLONASE  Place 2 sprays into  both nostrils daily as needed for allergies or rhinitis.   losartan -hydrochlorothiazide  50-12.5 MG tablet Commonly known as: HYZAAR  Take 1 tablet by mouth daily.   metFORMIN  1000 MG tablet Commonly known as: GLUCOPHAGE  Take 1 tablet (1,000 mg total) by mouth 2 (two) times daily with a meal.   morphine  15 MG 12 hr tablet Commonly known as: MS Contin  Take 1 tablet (15 mg total) by mouth every 12 (twelve) hours for chronic pain.   naloxone  4 MG/0.1ML Liqd  nasal spray kit Commonly known as: NARCAN  Use as directed What changed:  how much to take how to take this when to take this   Oxycodone  HCl 10 MG Tabs Take 1 tablet (10 mg total) by mouth 3 (three) times daily as needed. What changed: reasons to take this   polyethylene glycol 17 g packet Commonly known as: MIRALAX  / GLYCOLAX  Take 17 g by mouth 2 (two) times daily. What changed:  when to take this reasons to take this   rosuvastatin  20 MG tablet Commonly known as: CRESTOR  Take 1 tablet (20 mg total) by mouth daily. *Make appt with Dr. Pietro for refills [phone]*   senna 8.6 MG Tabs tablet Commonly known as: SENOKOT Take 3 tablets (25.8 mg total) by mouth 2 (two) times daily. What changed: how much to take   tadalafil  20 MG tablet Commonly known as: CIALIS  Take 0.5-1 tablets (10-20 mg total) by mouth every other day as needed for erectile dysfunction.   tamsulosin  0.4 MG Caps capsule Commonly known as: FLOMAX  Take 1 capsule (0.4 mg total) by mouth at bedtime.   Zinc  Sulfate 220 (50 Zn) MG Tabs Take 1 tablet (220 mg total) by mouth daily.        Follow-up Information     Eletha Boas, MD Follow up.   Specialty: General Surgery Why: As needed to discuss removing your gallbladder Contact information: [address] [place] [address] [phone]                Allergies  Allergen Reactions   Penicillins Swelling and Other (See Comments)    REACTION: Blister and Sores in the mouth  Blisters in mouth, REACTION: Blister and Sores in the mouth   Duloxetine  Hcl Other (See Comments)    Other Reaction(s): GI Intolerance    Consultations: General surgery  Procedures:   Discharge Exam: BP (!) 131/97 (BP Location: Left Arm)   Pulse (!) 101   Temp 97.8 F (36.6 C)   Resp 16   Ht 6' 2 (1.88 m)   Wt (!) 138.3 kg   SpO2 99%   BMI 39.16 kg/m  Physical Exam Constitutional:      General: He is not in acute distress.     Appearance: Normal appearance. He is obese.  HENT:     Head: Normocephalic and atraumatic.     Mouth/Throat:     Mouth: Mucous membranes are moist.  Eyes:     Extraocular Movements: Extraocular movements intact.  Cardiovascular:     Rate and Rhythm: Normal rate and regular rhythm.  Pulmonary:     Effort: Pulmonary effort is normal. No respiratory distress.     Breath sounds: Normal breath sounds. No wheezing.  Abdominal:     General: Bowel sounds are normal. There is no distension.     Palpations: Abdomen is soft.     Tenderness: There is no abdominal tenderness.  Musculoskeletal:        General: Normal range of motion.  Cervical back: Normal range of motion and neck supple.  Skin:    General: Skin is warm and dry.  Neurological:     General: No focal deficit present.     Mental Status: He is alert.  Psychiatric:        Mood and Affect: Mood normal.        Behavior: Behavior normal.      The results of significant diagnostics from this hospitalization (including imaging, microbiology, ancillary and laboratory) are listed below for reference.   Microbiology: No results found for this or any previous visit (from the past 240 hours).   Labs: BNP (last 3 results) No results for input(s): BNP in the last 8760 hours. Basic Metabolic Panel: Recent Labs  Lab 11/15/23 2313 11/17/23 0518  NA 137 136  K 3.8 3.8  CL 96* 101  CO2 23 22  GLUCOSE 196* 113*  BUN 21* 21*  CREATININE 1.34* 1.26*  CALCIUM  9.7 9.3   Liver Function Tests: Recent Labs  Lab 11/15/23 2313 11/17/23 0518  AST 64* 32  ALT 36 63*  ALKPHOS 84 66  BILITOT 1.6* 1.2  PROT 7.2 6.7  ALBUMIN  4.6 3.7   Recent Labs  Lab 11/15/23 2313  LIPASE 25   No results for input(s): AMMONIA in the last 168 hours. CBC: Recent Labs  Lab 11/15/23 2313 11/17/23 0518  WBC 17.5* 9.6  NEUTROABS 15.8*  --   HGB 16.1 14.6  HCT 47.1 44.2  MCV 87.5 91.9  PLT 266 211   Cardiac Enzymes: No results for  input(s): CKTOTAL, CKMB, CKMBINDEX, TROPONINI in the last 168 hours. BNP: Invalid input(s): POCBNP CBG: Recent Labs  Lab 11/16/23 1620 11/16/23 1945 11/16/23 2359 11/17/23 0406 11/17/23 0800  GLUCAP 115* 234* 127* 107* 119*   D-Dimer No results for input(s): DDIMER in the last 72 hours. Hgb A1c No results for input(s): HGBA1C in the last 72 hours. Lipid Profile No results for input(s): CHOL, HDL, LDLCALC, TRIG, CHOLHDL, LDLDIRECT in the last 72 hours. Thyroid  function studies No results for input(s): TSH, T4TOTAL, T3FREE, THYROIDAB in the last 72 hours.  Invalid input(s): FREET3 Anemia work up No results for input(s): VITAMINB12, FOLATE, FERRITIN, TIBC, IRON, RETICCTPCT in the last 72 hours. Urinalysis    Component Value Date/Time   COLORURINE YELLOW 11/16/2023 0158   APPEARANCEUR CLEAR 11/16/2023 0158   LABSPEC >=1.030 11/16/2023 0158   PHURINE 5.5 11/16/2023 0158   GLUCOSEU NEGATIVE 11/16/2023 0158   HGBUR NEGATIVE 11/16/2023 0158   BILIRUBINUR NEGATIVE 11/16/2023 0158   KETONESUR NEGATIVE 11/16/2023 0158   PROTEINUR NEGATIVE 11/16/2023 0158   UROBILINOGEN 0.2 11/13/2017 1919   NITRITE NEGATIVE 11/16/2023 0158   LEUKOCYTESUR NEGATIVE 11/16/2023 0158   Sepsis Labs Recent Labs  Lab 11/15/23 2313 11/17/23 0518  WBC 17.5* 9.6   Microbiology No results found for this or any previous visit (from the past 240 hours).  Procedures/Studies: NM Hepatobiliary Liver Func Result Date: 11/16/2023 CLINICAL DATA:  Cholecystitis. Mid abdominal pain after eating fatty foods. EXAM: NUCLEAR MEDICINE HEPATOBILIARY IMAGING TECHNIQUE: Sequential images of the abdomen were obtained out to 60 minutes following intravenous administration of radiopharmaceutical. RADIOPHARMACEUTICALS:  5 mCi Tc-37m  Choletec  IV COMPARISON:  Ultrasound 11/16/2023 and CT abdomen pelvis 11/16/2023. FINDINGS: Prompt uptake and biliary excretion of activity by the  liver is seen. Gallbladder activity is visualized, consistent with patency of cystic duct. Biliary activity passes into small bowel, consistent with patent common bile duct. IMPRESSION: Normal exam. Electronically Signed   By: Newell  Blietz M.D.   On: 11/16/2023 16:17   US  Abdomen Limited RUQ (LIVER/GB) Result Date: 11/16/2023 CLINICAL DATA:  Epigastric pain with gallbladder wall thickening and pericholecystic fluid on CT. EXAM: ULTRASOUND ABDOMEN LIMITED RIGHT UPPER QUADRANT COMPARISON:  CT 11/16/2023 FINDINGS: Gallbladder: Small gallstones, including stones at the gallbladder neck. As seen on CT, there is moderate gallbladder wall thickening with a small amount of pericholecystic fluid. Negative sonographic Murphy sign according to the sonographer. Common bile duct: Diameter: Not measured due to patient nausea and vomiting. No evidence of intra or extrahepatic biliary dilatation as correlated with same day CT. Liver: No focal lesion identified. Within normal limits in parenchymal echogenicity. Portal vein is patent on color Doppler imaging with normal direction of blood flow towards the liver. Other: None. IMPRESSION: Cholelithiasis with gallbladder wall thickening and pericholecystic fluid but no sonographic Murphy sign. Findings are indeterminate for acute cholecystitis. Consider nuclear medicine hepatobiliary scan. Electronically Signed   By: Elsie Perone M.D.   On: 11/16/2023 08:56   CT ABDOMEN PELVIS W CONTRAST Result Date: 11/16/2023 CLINICAL DATA:  Epigastric pain EXAM: CT ABDOMEN AND PELVIS WITH CONTRAST TECHNIQUE: Multidetector CT imaging of the abdomen and pelvis was performed using the standard protocol following bolus administration of intravenous contrast. RADIATION DOSE REDUCTION: This exam was performed according to the departmental dose-optimization program which includes automated exposure control, adjustment of the mA and/or kV according to patient size and/or use of iterative  reconstruction technique. CONTRAST:  OMNIPAQUE  IOHEXOL  300 MG/ML  SOLN COMPARISON:  CT 01/22/2021, 06/18/2022 FINDINGS: Lower chest: Lung bases demonstrate no acute airspace disease. Coronary vascular calcification Hepatobiliary: Hepatic steatosis. No calcified gallstones however diffuse gallbladder wall thickening with enhancement and probable pericholecystic fluid. No biliary dilatation Pancreas: Unremarkable. No pancreatic ductal dilatation or surrounding inflammatory changes. Spleen: Appears enlarged, craniocaudal measurement of 15 cm Adrenals/Urinary Tract: Adrenal glands are normal. Kidneys show no hydronephrosis. Bladder is normal. No significant excretion of contrast on delayed views Stomach/Bowel: Stomach nonenlarged. No dilated small bowel. No acute bowel wall thickening. Vascular/Lymphatic: Aortic atherosclerosis. No enlarged abdominal or pelvic lymph nodes. Reproductive: Prostate appears slightly enlarged Other: Negative for pelvic effusion or free air. Musculoskeletal: Posterior spinal fusion hardware L1 through L5 with pedicle screw fracture at S1 is noted on CT from March. No acute osseous abnormality IMPRESSION: 1. Diffuse gallbladder wall thickening with wall enhancement and probable pericholecystic fluid. No calcified gallstones are seen. Findings raise concern for acute cholecystitis. Recommend correlation with ultrasound. 2. Hepatic steatosis. 3. Splenomegaly. 4. Aortic atherosclerosis. Aortic Atherosclerosis (ICD10-I70.0). Electronically Signed   By: Luke Bun M.D.   On: 11/16/2023 02:02     Time coordinating discharge: Over 30 minutes    Alm Apo, MD  [facility] 11/17/2023, 1:00 PM

## 2023-11-17 NOTE — Progress Notes (Signed)
   11/17/23 0908  TOC Brief Assessment  Insurance and Status Reviewed  Patient has primary care physician Yes  Home environment has been reviewed home w/ spouse  Prior level of function: independent  Prior/Current Home Services No current home services  Social Drivers of Health Review SDOH reviewed no interventions necessary  Readmission risk has been reviewed Yes  Transition of care needs no transition of care needs at this time

## 2023-11-17 NOTE — Hospital Course (Addendum)
 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 He had just had high content fat meal prior to symptom onset. CT abdomen/pelvis showed diffuse gallbladder wall thickening and potential pericholecystic fluid.  RUQ ultrasound was then performed showing cholelithiasis with gallbladder wall thickening and pericholecystic fluid but findings still considered indeterminate. HIDA scan was then performed which was normal.  General surgery was consulted on admission.  Symptoms were considered due to potential biliary colic and he was offered cholecystectomy but declined during hospitalization.  He was discharged home with outpatient follow-up.

## 2023-11-17 NOTE — Progress Notes (Signed)
 Subjective: Doing well this morning.  No pain.  Tolerated solid diet last night with no recurrence of symptoms.  ROS: See above, otherwise other systems negative  Objective: Vital signs in last 24 hours: Temp:  [97.8 F (36.6 C)-98.2 F (36.8 C)] 97.8 F (36.6 C) (08/05 0406) Pulse Rate:  [73-101] 101 (08/05 0406) Resp:  [12-18] 16 (08/05 0406) BP: (105-138)/(71-97) 131/97 (08/05 0406) SpO2:  [97 %-100 %] 99 % (08/05 0406) Last BM Date : 11/15/23  Intake/Output from previous day: 08/04 0701 - 08/05 0700 In: 240 [P.O.:240] Out: -  Intake/Output this shift: No intake/output data recorded.  PE: Gen: sitting up in his chair in NAD Abd: soft, NT, ND, obese  Lab Results:  Recent Labs    11/15/23 2313 11/17/23 0518  WBC 17.5* 9.6  HGB 16.1 14.6  HCT 47.1 44.2  PLT 266 211   BMET Recent Labs    11/15/23 2313 11/17/23 0518  NA 137 136  K 3.8 3.8  CL 96* 101  CO2 23 22  GLUCOSE 196* 113*  BUN 21* 21*  CREATININE 1.34* 1.26*  CALCIUM  9.7 9.3   PT/INR No results for input(s): LABPROT, INR in the last 72 hours. CMP     Component Value Date/Time   NA 136 11/17/2023 0518   K 3.8 11/17/2023 0518   CL 101 11/17/2023 0518   CO2 22 11/17/2023 0518   GLUCOSE 113 (H) 11/17/2023 0518   BUN 21 (H) 11/17/2023 0518   CREATININE 1.26 (H) 11/17/2023 0518   CREATININE 1.38 (H) 10/26/2023 0853   CALCIUM  9.3 11/17/2023 0518   PROT 6.7 11/17/2023 0518   ALBUMIN  3.7 11/17/2023 0518   AST 32 11/17/2023 0518   ALT 63 (H) 11/17/2023 0518   ALKPHOS 66 11/17/2023 0518   BILITOT 1.2 11/17/2023 0518   GFRNONAA >60 11/17/2023 0518   GFRAA 104 03/13/2008 1027   Lipase     Component Value Date/Time   LIPASE 25 11/15/2023 2313       Studies/Results: NM Hepatobiliary Liver Func Result Date: 11/16/2023 CLINICAL DATA:  Cholecystitis. Mid abdominal pain after eating fatty foods. EXAM: NUCLEAR MEDICINE HEPATOBILIARY IMAGING TECHNIQUE: Sequential images of the  abdomen were obtained out to 60 minutes following intravenous administration of radiopharmaceutical. RADIOPHARMACEUTICALS:  5 mCi Tc-20m  Choletec  IV COMPARISON:  Ultrasound 11/16/2023 and CT abdomen pelvis 11/16/2023. FINDINGS: Prompt uptake and biliary excretion of activity by the liver is seen. Gallbladder activity is visualized, consistent with patency of cystic duct. Biliary activity passes into small bowel, consistent with patent common bile duct. IMPRESSION: Normal exam. Electronically Signed   By: Newell Eke M.D.   On: 11/16/2023 16:17   US  Abdomen Limited RUQ (LIVER/GB) Result Date: 11/16/2023 CLINICAL DATA:  Epigastric pain with gallbladder wall thickening and pericholecystic fluid on CT. EXAM: ULTRASOUND ABDOMEN LIMITED RIGHT UPPER QUADRANT COMPARISON:  CT 11/16/2023 FINDINGS: Gallbladder: Small gallstones, including stones at the gallbladder neck. As seen on CT, there is moderate gallbladder wall thickening with a small amount of pericholecystic fluid. Negative sonographic Murphy sign according to the sonographer. Common bile duct: Diameter: Not measured due to patient nausea and vomiting. No evidence of intra or extrahepatic biliary dilatation as correlated with same day CT. Liver: No focal lesion identified. Within normal limits in parenchymal echogenicity. Portal vein is patent on color Doppler imaging with normal direction of blood flow towards the liver. Other: None. IMPRESSION: Cholelithiasis with gallbladder wall thickening and pericholecystic fluid but no sonographic Murphy sign.  Findings are indeterminate for acute cholecystitis. Consider nuclear medicine hepatobiliary scan. Electronically Signed   By: Elsie Perone M.D.   On: 11/16/2023 08:56   CT ABDOMEN PELVIS W CONTRAST Result Date: 11/16/2023 CLINICAL DATA:  Epigastric pain EXAM: CT ABDOMEN AND PELVIS WITH CONTRAST TECHNIQUE: Multidetector CT imaging of the abdomen and pelvis was performed using the standard protocol following  bolus administration of intravenous contrast. RADIATION DOSE REDUCTION: This exam was performed according to the departmental dose-optimization program which includes automated exposure control, adjustment of the mA and/or kV according to patient size and/or use of iterative reconstruction technique. CONTRAST:  OMNIPAQUE  IOHEXOL  300 MG/ML  SOLN COMPARISON:  CT 01/22/2021, 06/18/2022 FINDINGS: Lower chest: Lung bases demonstrate no acute airspace disease. Coronary vascular calcification Hepatobiliary: Hepatic steatosis. No calcified gallstones however diffuse gallbladder wall thickening with enhancement and probable pericholecystic fluid. No biliary dilatation Pancreas: Unremarkable. No pancreatic ductal dilatation or surrounding inflammatory changes. Spleen: Appears enlarged, craniocaudal measurement of 15 cm Adrenals/Urinary Tract: Adrenal glands are normal. Kidneys show no hydronephrosis. Bladder is normal. No significant excretion of contrast on delayed views Stomach/Bowel: Stomach nonenlarged. No dilated small bowel. No acute bowel wall thickening. Vascular/Lymphatic: Aortic atherosclerosis. No enlarged abdominal or pelvic lymph nodes. Reproductive: Prostate appears slightly enlarged Other: Negative for pelvic effusion or free air. Musculoskeletal: Posterior spinal fusion hardware L1 through L5 with pedicle screw fracture at S1 is noted on CT from March. No acute osseous abnormality IMPRESSION: 1. Diffuse gallbladder wall thickening with wall enhancement and probable pericholecystic fluid. No calcified gallstones are seen. Findings raise concern for acute cholecystitis. Recommend correlation with ultrasound. 2. Hepatic steatosis. 3. Splenomegaly. 4. Aortic atherosclerosis. Aortic Atherosclerosis (ICD10-I70.0). Electronically Signed   By: Luke Bun M.D.   On: 11/16/2023 02:02    Anti-infectives: Anti-infectives (From admission, onward)    Start     Dose/Rate Route Frequency Ordered Stop    11/16/23 1530  cefTRIAXone  (ROCEPHIN ) 2 g in sodium chloride  0.9 % 100 mL IVPB  Status:  Discontinued        2 g 200 mL/hr over 30 Minutes Intravenous Every 24 hours 11/16/23 1439 11/17/23 0822   11/16/23 1445  cefadroxil  (DURICEF) capsule 500 mg  Status:  Discontinued        500 mg Oral 2 times daily 11/16/23 1345 11/16/23 1455        Assessment/Plan Biliary colic -HIDA scan negative for acute cholecystitis -WBC down to 9 today from 17K -stop abx, not needed -discussed lap chole today for biliary colic.  The patient politely declined as he does not want to have surgery right now.  We discussed that his symptoms may return at any given point.  He understands.  I have given him out office information as well as gallbladder and eating plan information. -he wishes to go home.  I have d/w primary service.   FEN - low fat diet VTE - ok from our standpoint ID - rocephin  stopped  I reviewed hospitalist notes, last 24 h vitals and pain scores, last 48 h intake and output, last 24 h labs and trends, and last 24 h imaging results.   LOS: 0 days    Burnard FORBES Banter , [facility] 11/17/2023, 8:22 AM Please see Amion for pager number during day hours 7:00am-4:30pm or 7:00am -11:30am on weekends

## 2023-11-30 ENCOUNTER — Other Ambulatory Visit: Payer: Self-pay | Admitting: Internal Medicine

## 2023-11-30 ENCOUNTER — Other Ambulatory Visit: Payer: Self-pay | Admitting: Physical Medicine and Rehabilitation

## 2023-11-30 ENCOUNTER — Other Ambulatory Visit: Payer: Self-pay

## 2023-11-30 ENCOUNTER — Other Ambulatory Visit (HOSPITAL_COMMUNITY): Payer: Self-pay

## 2023-11-30 MED ORDER — LOSARTAN POTASSIUM-HCTZ 50-12.5 MG PO TABS
1.0000 | ORAL_TABLET | Freq: Every day | ORAL | 1 refills | Status: AC
  Filled 2023-11-30: qty 90, 90d supply, fill #0
  Filled 2024-03-02: qty 90, 90d supply, fill #1

## 2023-11-30 MED ORDER — OXYCODONE HCL 10 MG PO TABS
10.0000 mg | ORAL_TABLET | Freq: Three times a day (TID) | ORAL | 0 refills | Status: DC | PRN
  Filled 2023-11-30 – 2023-12-03 (×2): qty 90, 30d supply, fill #0

## 2023-12-01 ENCOUNTER — Other Ambulatory Visit (HOSPITAL_COMMUNITY): Payer: Self-pay

## 2023-12-02 ENCOUNTER — Encounter: Payer: Self-pay | Admitting: Physical Medicine and Rehabilitation

## 2023-12-02 ENCOUNTER — Encounter: Attending: Physical Medicine and Rehabilitation | Admitting: Physical Medicine and Rehabilitation

## 2023-12-02 ENCOUNTER — Other Ambulatory Visit (HOSPITAL_COMMUNITY): Payer: Self-pay

## 2023-12-02 VITALS — BP 135/82 | HR 100 | Ht 74.0 in | Wt 302.0 lb

## 2023-12-02 DIAGNOSIS — M7918 Myalgia, other site: Secondary | ICD-10-CM | POA: Insufficient documentation

## 2023-12-02 DIAGNOSIS — M5416 Radiculopathy, lumbar region: Secondary | ICD-10-CM | POA: Diagnosis not present

## 2023-12-02 DIAGNOSIS — G894 Chronic pain syndrome: Secondary | ICD-10-CM | POA: Insufficient documentation

## 2023-12-02 DIAGNOSIS — Z5181 Encounter for therapeutic drug level monitoring: Secondary | ICD-10-CM | POA: Diagnosis not present

## 2023-12-02 DIAGNOSIS — Z79891 Long term (current) use of opiate analgesic: Secondary | ICD-10-CM | POA: Diagnosis not present

## 2023-12-02 MED ORDER — NALOXONE HCL 4 MG/0.1ML NA LIQD
NASAL | 1 refills | Status: AC
  Filled 2023-12-02: qty 2, 30d supply, fill #0
  Filled 2023-12-02: qty 2, 1d supply, fill #0
  Filled 2024-02-02 – 2024-02-03 (×2): qty 2, 30d supply, fill #1

## 2023-12-02 MED ORDER — OXYCODONE HCL 10 MG PO TABS
10.0000 mg | ORAL_TABLET | Freq: Three times a day (TID) | ORAL | 0 refills | Status: DC | PRN
  Filled 2023-12-02: qty 90, 30d supply, fill #0

## 2023-12-02 MED ORDER — LIDOCAINE HCL 1 % IJ SOLN
3.0000 mL | Freq: Once | INTRAMUSCULAR | Status: AC
  Administered 2023-12-02: 3 mL

## 2023-12-02 NOTE — Patient Instructions (Signed)
 Plan: We discussed that needs to eat a lower fat diet-  to delay needing gallbladder surgery-    2. Con't Oxycodone - will refill today- hasn't picked up Rx til 8/22- send in 1 rx, since hasn't picked up other.      3. Rx for Narcan  nasal spray as needed.  Just in case   4. Due for urine drug screen today-- last done 04/2023- per clinic policy- ordered   5. For severe myofascial pain- Patient here for trigger point injections for  Consent done and on chart.   Cleaned areas with alcohol and injected using a 27 gauge 1.5 inch needle   Injected 3cc none wasted Using 1% Lidocaine  with no EPI   Upper traps Levators Posterior scalenes Middle scalenes Splenius Capitus Pectoralis Major Rhomboids Infraspinatus Teres Major/minor Thoracic paraspinals- B/L - low thoracic paraspinals Lumbar paraspinals- B/L x3 on each side-  Other injections- -      Patient's level of pain prior was Current level of pain after injections is   There was no bleeding or complications.   Patient was advised to drink a lot of water on day after injections to flush system Will have increased soreness for 12-48 hours after injections.  Can use Lidocaine  patches the day AFTER injections Can use theracane on day of injections in places didn't inject Can use heating pad 4-6 hours AFTER injections   6. F/

## 2023-12-02 NOTE — Progress Notes (Signed)
 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  Also has DM- last A1c 6.2.      And for trigger point injections                  Didn't get Lap chole- didn't want to stay in hospital.   Mother also had gallbladder out in past- in 48's.  Current pain regimen is working- worked better than thought it would.  Doing the trick-  Taking oxycodone - 10 mg BID- written for TID.   Doesn't like being a zombie-  it makes him more sleepy than MS Contin .   Takes kids ot school, then takes  1-2 hour nap after takes AM dose of Oxy.     Plan: We discussed that needs to eat a lower fat diet-  to delay needing gallbladder surgery-   2. Con't Oxycodone - will refill today- hasn't picked up Rx til 8/22- send in 1 rx, since hasn't picked up other.    3. Rx for Narcan  nasal spray as needed.  Just in case  4. Due for urine drug screen today-- last done 04/2023- per clinic policy- ordered  5. For severe myofascial pain- Patient here for trigger point injections for  Consent done and on chart.  Cleaned areas with alcohol and injected using a 27 gauge 1.5 inch needle  Injected 3cc none wasted Using 1% Lidocaine  with no EPI  Upper traps Levators Posterior scalenes Middle scalenes Splenius Capitus Pectoralis Major Rhomboids Infraspinatus Teres Major/minor Thoracic paraspinals- B/L - low thoracic paraspinals Lumbar paraspinals- B/L x3 on each side-  Other injections- -    Patient's level of pain prior was Current level of pain after injections is  There was no bleeding or complications.  Patient was advised to drink a lot of water on day after injections to flush system Will have increased soreness for  12-48 hours after injections.  Can use Lidocaine  patches the day AFTER injections Can use theracane on day of injections in places didn't inject Can use heating pad 4-6 hours AFTER injections   6. F/U in 6 weeks- f/u on back pain and trigger point injections  I spent a total of  26   minutes on total care today- >50% coordination of care- due to 5 minutes on trigger point injections- rest d/w pt about pain, his gallbladder disease and how to prevent/delay symptoms.

## 2023-12-03 ENCOUNTER — Other Ambulatory Visit (HOSPITAL_COMMUNITY): Payer: Self-pay

## 2023-12-29 ENCOUNTER — Encounter: Payer: Self-pay | Admitting: Physical Medicine and Rehabilitation

## 2024-01-06 ENCOUNTER — Other Ambulatory Visit: Payer: Self-pay

## 2024-01-06 ENCOUNTER — Other Ambulatory Visit (HOSPITAL_COMMUNITY): Payer: Self-pay

## 2024-01-12 ENCOUNTER — Ambulatory Visit (INDEPENDENT_AMBULATORY_CARE_PROVIDER_SITE_OTHER): Admitting: Internal Medicine

## 2024-01-12 ENCOUNTER — Encounter: Payer: Self-pay | Admitting: Internal Medicine

## 2024-01-12 VITALS — BP 108/66 | HR 108 | Temp 98.0°F | Resp 18 | Ht 74.0 in | Wt 297.5 lb

## 2024-01-12 DIAGNOSIS — Z23 Encounter for immunization: Secondary | ICD-10-CM | POA: Diagnosis not present

## 2024-01-12 DIAGNOSIS — G894 Chronic pain syndrome: Secondary | ICD-10-CM

## 2024-01-12 DIAGNOSIS — E7800 Pure hypercholesterolemia, unspecified: Secondary | ICD-10-CM | POA: Diagnosis not present

## 2024-01-12 DIAGNOSIS — Z0001 Encounter for general adult medical examination with abnormal findings: Secondary | ICD-10-CM

## 2024-01-12 DIAGNOSIS — E1159 Type 2 diabetes mellitus with other circulatory complications: Secondary | ICD-10-CM

## 2024-01-12 DIAGNOSIS — Z6838 Body mass index (BMI) 38.0-38.9, adult: Secondary | ICD-10-CM

## 2024-01-12 DIAGNOSIS — I10 Essential (primary) hypertension: Secondary | ICD-10-CM | POA: Diagnosis not present

## 2024-01-12 DIAGNOSIS — Z0000 Encounter for general adult medical examination without abnormal findings: Secondary | ICD-10-CM | POA: Diagnosis not present

## 2024-01-12 DIAGNOSIS — G4733 Obstructive sleep apnea (adult) (pediatric): Secondary | ICD-10-CM

## 2024-01-12 DIAGNOSIS — Z7984 Long term (current) use of oral hypoglycemic drugs: Secondary | ICD-10-CM

## 2024-01-12 LAB — BASIC METABOLIC PANEL WITH GFR
BUN: 13 mg/dL (ref 6–23)
CO2: 28 meq/L (ref 19–32)
Calcium: 9.5 mg/dL (ref 8.4–10.5)
Chloride: 97 meq/L (ref 96–112)
Creatinine, Ser: 1.18 mg/dL (ref 0.40–1.50)
GFR: 67.07 mL/min (ref >60.00)
Glucose, Bld: 109 mg/dL — ABNORMAL HIGH (ref 70–99)
Potassium: 3.6 meq/L (ref 3.5–5.1)
Sodium: 135 meq/L (ref 135–145)

## 2024-01-12 LAB — MICROALBUMIN / CREATININE URINE RATIO
Creatinine,U: 93.9 mg/dL
Microalb Creat Ratio: UNDETERMINED mg/g (ref 0.0–30.0)
Microalb, Ur: <0.7 mg/dL

## 2024-01-12 LAB — LIPID PANEL
Cholesterol: 82 mg/dL (ref 0–200)
HDL: 34.2 mg/dL — ABNORMAL LOW (ref >39.00)
LDL Cholesterol: 17 mg/dL (ref 0–99)
NonHDL: 47.56
Total CHOL/HDL Ratio: 2
Triglycerides: 155 mg/dL — ABNORMAL HIGH (ref 0.0–149.0)
VLDL: 31 mg/dL (ref 0.0–40.0)

## 2024-01-12 LAB — AST: AST: 11 U/L (ref 0–37)

## 2024-01-12 LAB — PSA: PSA: 2.94 ng/mL (ref 0.10–4.00)

## 2024-01-12 LAB — HEMOGLOBIN A1C: Hgb A1c MFr Bld: 6.1 % (ref 4.6–6.5)

## 2024-01-12 LAB — ALT: ALT: 13 U/L (ref 0–53)

## 2024-01-12 NOTE — Patient Instructions (Addendum)
 GO TO THE LAB :  Get the blood work    Then, go to the front desk for the checkout Please make an appointment for a checkup in 4 months   Recommend to consider COVID booster  Check the  blood pressure regularly Blood pressure goal:  between 110/65 and  135/85. If it is consistently higher or lower, let me know  Diabetes  You can check your sugars at different times  - early in AM fasting  ( blood sugar goal 70-130) - 2 hours after a meal (blood sugar goal less than 180)    Please read more detailed instructions below  TYPE 2 DIABETES MELLITUS: Your diabetes is currently managed with metformin . -Continue taking metformin . -We will check your A1c levels.  HYPERTENSION: Your blood pressure is well-controlled with your current medications. -Continue your current hypertension medications. -We will check your BMP levels.  HYPERLIPIDEMIA: Your cholesterol levels are managed with Crestor . -Continue taking Crestor . -We will check your FLP, AST, and ALT levels.  MORBID OBESITY: You have been advised to make dietary modifications to manage your weight. -Continue with dietary modifications.  CHRONIC BACK PAIN: Your chronic back pain is being managed with physical medicine and rehabilitation. -Continue your current management plan with physical medicine and rehabilitation. -Proceed with your scheduled injections.      ABDOMINAL PAIN, RESOLVED: Your previous abdominal pain, suspected to be related to gallbladder issues, has resolved. -Continue to avoid greasy foods to prevent recurrence of symptoms.

## 2024-01-12 NOTE — Progress Notes (Signed)
 "  Subjective:    Patient ID: [name], male    DOB: 1966/03/17, 58 y.o.   MRN: [id number]  DOS:  01/12/2024 Type of visit - description:  Discussed the use of AI scribe software for clinical note transcription with the patient, who gave verbal consent to proceed. History of Present Illness  Abdominal pain - Recent episode of stomach pain resulting in hospitalization for suspected gallbladder pathology - HIDA scan negative for gallbladder disease - Currently asymptomatic - Symptom improvement attributed to dietary modifications, specifically avoidance of greasy foods  Chronic back pain and muscle spasms - Chronic back pain with associated muscle spasms - Uses leftover medication for symptomatic relief - Persistent sensation of a 'little knot' in the back - Under ongoing care with physical medicine and rehabilitation  Hypertension - Home blood pressure readings have been well controlled, though not checked recently  Urinary symptoms - Takes Flomax  for urinary symptoms - No recent urinary difficulties  Constipation - Uses medication for constipation, attributed to side effects from other medications  Sleep apnea - Previously used CPAP for sleep apnea - Discontinued CPAP following weight loss - No recurrence of sleep apnea symptoms since discontinuation   - No recent chest pain, dyspnea, or lower extremity edema    Review of Systems  Other than above, a 14 point review of systems is negative     Past Medical History:  Diagnosis Date   Abdominal pain 01/25/2021   DDD (degenerative disc disease), lumbar    Diabetes mellitus    Hyperlipemia    Hypertension    Obesity    OSA (obstructive sleep apnea)    on CPAP    Past Surgical History:  Procedure Laterality Date   APPLICATION OF ROBOTIC ASSISTANCE FOR SPINAL PROCEDURE N/A 01/11/2021   Procedure: APPLICATION OF ROBOTIC ASSISTANCE FOR SPINAL PROCEDURE;  Surgeon: Cheryle Debby LABOR, MD;  Location: [facility] OR;   Service: Neurosurgery;  Laterality: N/A;   APPLICATION OF WOUND VAC N/A 01/25/2021   Procedure: APPLICATION OF WOUND VAC;  Surgeon: Dawley, Lani BROCKS, DO;  Location: [facility] OR;  Service: Neurosurgery;  Laterality: N/A;   BACK SURGERY     COLONOSCOPY  2019   LUMBAR WOUND DEBRIDEMENT N/A 01/25/2021   Procedure: Irrigation and Debridement of lumbar wound, and wound vacuum assisted closure;  Surgeon: Dawley, Lani BROCKS, DO;  Location: [facility] OR;  Service: Neurosurgery;  Laterality: N/A;   TRANSFORAMINAL LUMBAR INTERBODY FUSION (TLIF) WITH PEDICLE SCREW FIXATION 4 LEVEL N/A 01/11/2021   Procedure: Lumbar one-two, Lumbar two-three, Lumbar three-four, Lumbar four-five, Lumbar five Sacral one Open decompression, Transforaminal lumbar interbody fusion, posterolateral instrumented fusion;  Surgeon: Cheryle Debby LABOR, MD;  Location: [facility] OR;  Service: Neurosurgery;  Laterality: N/A;    Current Outpatient Medications  Medication Instructions   ascorbic acid  (VITAMIN C) 500 mg, Oral, 2 times daily   cefadroxil  (DURICEF) 500 mg, Oral, 2 times daily   cholecalciferol (VITAMIN D3) 1,000 Units, Daily   famotidine-calcium  carbonate-magnesium  hydroxide (PEPCID COMPLETE) 10-800-165 MG chewable tablet 1 tablet, Daily PRN   fluticasone  (FLONASE ) 50 MCG/ACT nasal spray 2 sprays, Daily PRN   losartan -hydrochlorothiazide  (HYZAAR ) 50-12.5 MG tablet 1 tablet, Oral, Daily   metFORMIN  (GLUCOPHAGE ) 1,000 mg, Oral, 2 times daily with meals   morphine  (MS CONTIN ) 15 MG 12 hr tablet Take 1 tablet (15 mg total) by mouth every 12 (twelve) hours for chronic pain.   naloxone  (NARCAN ) nasal spray 4 mg/0.1 mL Use 1 spray in one nostril as needed for opioid  overdose. Repeat dose in alternate nostril in 2-3 minutes if no response.   Oxycodone  HCl 10 mg, Oral, 3 times daily PRN   Oxycodone  HCl 10 mg, Oral, 3 times daily PRN   polyethylene glycol (MIRALAX  / GLYCOLAX ) 17 g, Oral, 2 times daily   rosuvastatin  (CRESTOR ) 20 mg, Oral, Daily, *Make appt  with Dr. Pietro for refills [phone]*   senna (SENOKOT) 25.8 mg, Oral, 2 times daily   tadalafil  (CIALIS ) 10-20 mg, Oral, Every 48 hours PRN   tamsulosin  (FLOMAX ) 0.4 mg, Oral, Daily at bedtime   Zinc  Sulfate 220 mg, Oral, Daily       Objective:   Physical Exam BP 108/66   Pulse (!) 108   Temp 98 F (36.7 C) (Oral)   Resp 18   Ht 6' 2 (1.88 m)   Wt 297 lb 8 oz (134.9 kg)   SpO2 96%   BMI 38.20 kg/m  General: Well developed, NAD, BMI noted Neck: No  thyromegaly  HEENT:  Normocephalic . Face symmetric, atraumatic Lungs:  CTA B Normal respiratory effort, no intercostal retractions, no accessory muscle use. Heart: RRR,  no murmur.  Abdomen:  Not distended, soft, non-tender. No rebound or rigidity.   Lower extremities: no pretibial edema bilaterally  Skin: Exposed areas without rash. Not pale. Not jaundice Neurologic:  alert & oriented X3.  Speech normal, gait appropriate for age and unassisted Strength symmetric and appropriate for age.  Psych: Cognition and judgment appear intact.  Cooperative with normal attention span and concentration.  Behavior appropriate. No anxious or depressed appearing.     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, + radiation B legs) - Back surgery, 2022---discitis, lumbar osteomyelitis -Chronic neck pain Morbid obesity OSA  (severe per repeated study April 2022), off  CPAP since ~ 02-2021 d/t we loss Diastases recti CAD: Mildly elevated coronary calcium  score, f/u nuclear study (-), medical therapy   Assessment & Plan Here for CPX - Td 2024 - pnm 23: 2016; PNM 20 (2025) - s/p shingrix   - flu shot today - covid booster rec --prostate ca screening: No symptoms, on Flomax , check PSA --CCS--  Cscope 12-2014, 10 years  -Labs:  BMP AST ALT FLP A1c PSA -Lifestyle: Counseled    Other issues addressed Type 2 diabetes mellitus currently managed with metformin . Previous  intolerance to Mounjaro  noted. - Continue metformin , Check A1c Hypertension Hypertension, well-controlled on Hyzaar  no change, BMP  Hyperlipidemia Hyperlipidemia, managed with Crestor .  No change. Check FLP, AST, ALT Morbid obesity Recommend dietary modifications Chronic back pain Chronic back pain   managed with physical medicine and rehabilitation. LUTS: Managed with Flomax . Constipation Chronic constipation, likely secondary to medication use, managed with daily laxatives. Abdominal pain Admitted to the hospital, exerts: CT abdomen/pelvis showed diffuse gallbladder wall thickening and potential pericholecystic fluid.  RUQ ultrasound was then performed showing cholelithiasis with gallbladder wall thickening and pericholecystic fluid but findings still considered indeterminate.HIDA scan was then performed which was normal.  General surgery was consulted on admission.  Symptoms were considered due to potential biliary colic and he was offered cholecystectomy but declined No further symptoms, he is avoiding greasy foods.  Recommend to reach out if symptoms resurface RTC 4 months s     "

## 2024-01-13 ENCOUNTER — Encounter: Payer: Self-pay | Admitting: Internal Medicine

## 2024-01-13 ENCOUNTER — Encounter: Attending: Physical Medicine and Rehabilitation | Admitting: Physical Medicine and Rehabilitation

## 2024-01-13 ENCOUNTER — Other Ambulatory Visit (HOSPITAL_COMMUNITY): Payer: Self-pay

## 2024-01-13 ENCOUNTER — Encounter: Payer: Self-pay | Admitting: Physical Medicine and Rehabilitation

## 2024-01-13 VITALS — BP 121/81 | HR 112 | Ht 74.0 in | Wt 297.0 lb

## 2024-01-13 DIAGNOSIS — M7918 Myalgia, other site: Secondary | ICD-10-CM | POA: Insufficient documentation

## 2024-01-13 DIAGNOSIS — G894 Chronic pain syndrome: Secondary | ICD-10-CM | POA: Insufficient documentation

## 2024-01-13 DIAGNOSIS — M5416 Radiculopathy, lumbar region: Secondary | ICD-10-CM | POA: Insufficient documentation

## 2024-01-13 DIAGNOSIS — M5106 Intervertebral disc disorders with myelopathy, lumbar region: Secondary | ICD-10-CM | POA: Insufficient documentation

## 2024-01-13 MED ORDER — OXYCODONE HCL 10 MG PO TABS
10.0000 mg | ORAL_TABLET | Freq: Three times a day (TID) | ORAL | 0 refills | Status: DC | PRN
  Filled 2024-01-13: qty 90, 30d supply, fill #0

## 2024-01-13 MED ORDER — CYCLOBENZAPRINE HCL 10 MG PO TABS
10.0000 mg | ORAL_TABLET | Freq: Three times a day (TID) | ORAL | 3 refills | Status: DC | PRN
  Filled 2024-01-13: qty 90, 30d supply, fill #0

## 2024-01-13 MED ORDER — LIDOCAINE HCL 1 % IJ SOLN
3.0000 mL | Freq: Once | INTRAMUSCULAR | Status: AC
  Administered 2024-01-13: 3 mL

## 2024-01-13 NOTE — Patient Instructions (Signed)
 Plan: We refill Flexeril /Cyclobenzaprine - 10 mg up to 3x/day as needed for spasms.  2. Getting lumbar ESI 3rd week in October- by Dr Darlis  Patient here for trigger point injections for  Consent done and on chart.  Cleaned areas with alcohol and injected using a 27 gauge 1.5 inch needle  Injected 3cc- none wasted Using 1% Lidocaine  with no EPI  Upper traps Levators Posterior scalenes Middle scalenes Splenius Capitus Pectoralis Major Rhomboids Infraspinatus Teres Major/minor Thoracic paraspinals Lumbar paraspinals B/L x3 on R and 2 on L Other injections-    Patient's level of pain prior was 7/10 Current level of pain after injections is- down to 4/10  There was no bleeding or complications.  Patient was advised to drink a lot of water on day after injections to flush system Will have increased soreness for 12-48 hours after injections.  Can use Lidocaine  patches the day AFTER injections Can use theracane on day of injections in places didn't inject Can use heating pad 4-6 hours AFTER injections   3. Will refill Oxycodone  10mg  up to 3x/day- as needed- #90- with 2 refills- G89.4 is chronic pain dx.   4. F/U  as scheduled-

## 2024-01-13 NOTE — Assessment & Plan Note (Signed)
 Here for CPX - Td 2024 - pnm 23: 2016; PNM 20 (2025) - s/p shingrix   - flu shot today - covid booster rec --prostate ca screening: No symptoms, on Flomax , check PSA --CCS--  Cscope 12-2014, 10 years  -Labs:  BMP AST ALT FLP A1c PSA -Lifestyle: Counseled

## 2024-01-13 NOTE — Assessment & Plan Note (Signed)
 Here for CPX   Other issues addressed Type 2 diabetes mellitus currently managed with metformin . Previous intolerance to Mounjaro  noted. - Continue metformin , Check A1c Hypertension Hypertension, well-controlled on Hyzaar  no change, BMP  Hyperlipidemia Hyperlipidemia, managed with Crestor .  No change. Check FLP, AST, ALT Morbid obesity Recommend dietary modifications Chronic back pain Chronic back pain   managed with physical medicine and rehabilitation. LUTS: Managed with Flomax . Constipation Chronic constipation, likely secondary to medication use, managed with daily laxatives. Abdominal pain Admitted to the hospital, exerts: CT abdomen/pelvis showed diffuse gallbladder wall thickening and potential pericholecystic fluid.  RUQ ultrasound was then performed showing cholelithiasis with gallbladder wall thickening and pericholecystic fluid but findings still considered indeterminate.HIDA scan was then performed which was normal.  General surgery was consulted on admission.  Symptoms were considered due to potential biliary colic and he was offered cholecystectomy but declined No further symptoms, he is avoiding greasy foods.  Recommend to reach out if symptoms resurface RTC 4 months s

## 2024-01-13 NOTE — Progress Notes (Signed)
 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  Also has DM- last A1c 6.2.      And for trigger point injections   Flexeril  has really helped the muscle spasms/trigger points.   Might have been set off at a party from sitting too long.  Got SO big- was bigger than an orange and was SO painful.     Plan: We refill Flexeril /Cyclobenzaprine - 10 mg up to 3x/day as needed for spasms.  2. Getting lumbar ESI 3rd week in October- by Dr Darlis  Patient here for trigger point injections for  Consent done and on chart.  Cleaned areas with alcohol and injected using a 27 gauge 1.5 inch needle  Injected 3cc- none wasted Using 1% Lidocaine  with no EPI  Upper traps Levators Posterior scalenes Middle scalenes Splenius Capitus Pectoralis Major Rhomboids Infraspinatus Teres Major/minor Thoracic paraspinals Lumbar paraspinals B/L x3 on R and 2 on L Other injections-    Patient's level of pain prior was 7/10 Current level of pain after injections is- down to 4/10  There was no bleeding or complications.  Patient was advised to drink a lot of water on day after injections to flush system Will have increased soreness for 12-48 hours after injections.  Can use Lidocaine  patches the day AFTER injections Can use theracane on day of injections in places didn't inject Can use heating pad 4-6 hours AFTER injections   3. Will refill Oxycodone  10mg  up to 3x/day- as needed- #90- with 2 refills- G89.4 is chronic pain dx.   4. F/U  as scheduled-   I spent a total of 26    minutes on total care today- >50% coordination of care- due to 5 minutes on injections- rest discussing refills needed-

## 2024-01-14 ENCOUNTER — Ambulatory Visit: Payer: Self-pay | Admitting: Internal Medicine

## 2024-02-03 ENCOUNTER — Other Ambulatory Visit (HOSPITAL_COMMUNITY): Payer: Self-pay

## 2024-02-03 ENCOUNTER — Other Ambulatory Visit: Payer: Self-pay

## 2024-02-04 DIAGNOSIS — M5416 Radiculopathy, lumbar region: Secondary | ICD-10-CM | POA: Diagnosis not present

## 2024-02-18 ENCOUNTER — Encounter: Payer: Self-pay | Admitting: Infectious Diseases

## 2024-02-18 ENCOUNTER — Ambulatory Visit: Admitting: Infectious Diseases

## 2024-02-18 ENCOUNTER — Other Ambulatory Visit: Payer: Self-pay

## 2024-02-18 ENCOUNTER — Other Ambulatory Visit (HOSPITAL_COMMUNITY): Payer: Self-pay

## 2024-02-18 VITALS — BP 143/101 | HR 118 | Temp 97.6°F | Ht 74.0 in | Wt 288.0 lb

## 2024-02-18 DIAGNOSIS — T847XXD Infection and inflammatory reaction due to other internal orthopedic prosthetic devices, implants and grafts, subsequent encounter: Secondary | ICD-10-CM

## 2024-02-18 DIAGNOSIS — Z5181 Encounter for therapeutic drug level monitoring: Secondary | ICD-10-CM

## 2024-02-18 DIAGNOSIS — Z7185 Encounter for immunization safety counseling: Secondary | ICD-10-CM | POA: Diagnosis not present

## 2024-02-18 DIAGNOSIS — T847XXA Infection and inflammatory reaction due to other internal orthopedic prosthetic devices, implants and grafts, initial encounter: Secondary | ICD-10-CM | POA: Insufficient documentation

## 2024-02-18 MED ORDER — CEFADROXIL 500 MG PO CAPS
500.0000 mg | ORAL_CAPSULE | Freq: Two times a day (BID) | ORAL | 5 refills | Status: AC
  Filled 2024-03-17: qty 60, 30d supply, fill #0
  Filled 2024-04-13: qty 60, 30d supply, fill #1

## 2024-02-18 NOTE — Progress Notes (Signed)
 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   Spleen enlarged 01/25/2021   Lumbar radiculopathy 01/11/2021   Neuropathy 09/28/2020   Chronic pain syndrome 09/28/2020   Spondylosis, cervical, with myelopathy 09/28/2020   PCP NOTES >>>>>>>> 07/09/2015   DJD (degenerative joint disease) 10/27/2014   OSA (obstructive sleep apnea) 06/14/2012   ED (erectile dysfunction) 04/26/2012   Annual physical exam 12/29/2011   Allergic rhinitis 07/12/2010   Hyperlipidemia 04/26/2007   DM II (diabetes mellitus, type II), controlled (HCC) 04/22/2006   Essential hypertension 04/22/2006   Current Outpatient Medications on File Prior to Visit  Medication Sig Dispense Refill   ascorbic acid  (VITAMIN C) 500 MG tablet Take 1 tablet (500 mg total) by mouth 2 (two) times daily. (Patient taking differently: Take 500 mg by mouth daily.) 100 tablet 3   cefadroxil  (DURICEF) 500 MG capsule Take 1 capsule (500 mg total) by mouth 2 (two) times daily. 60 capsule 5   cholecalciferol (VITAMIN D3) 25 MCG (1000 UNIT) tablet Take 1,000 Units by mouth daily.     famotidine-calcium  carbonate-magnesium  hydroxide (PEPCID COMPLETE) 10-800-165 MG chewable tablet Chew 1 tablet by mouth daily as needed (indigestion).     fluticasone  (FLONASE ) 50 MCG/ACT nasal spray Place 2 sprays into both nostrils daily as needed for allergies or rhinitis.     losartan -hydrochlorothiazide  (HYZAAR ) 50-12.5 MG tablet Take 1 tablet by mouth daily. 90 tablet 1   metFORMIN  (GLUCOPHAGE ) 1000 MG tablet Take 1 tablet (1,000 mg total) by mouth 2 (two) times daily with a meal. 180 tablet 1   morphine  (MS CONTIN ) 15 MG 12 hr tablet Take 1 tablet (15 mg total) by mouth every 12 (twelve) hours for chronic pain. 60 tablet 0    naloxone  (NARCAN ) nasal spray 4 mg/0.1 mL Use 1 spray in one nostril as needed for opioid overdose. Repeat dose in alternate nostril in 2-3 minutes if no response. 2 each 1   Oxycodone  HCl 10 MG TABS Take 1 tablet (10 mg total) by mouth 3 (three) times daily as needed. 90 tablet 0   Oxycodone  HCl 10 MG TABS Take 1 tablet (10 mg total) by mouth 3 (three) times daily as needed. 90 tablet 0   polyethylene glycol (MIRALAX  / GLYCOLAX ) 17 g packet Take 17 g by mouth 2 (two) times daily. (Patient taking differently: Take 17 g by mouth daily as needed for mild constipation, moderate constipation or severe constipation.) 14 each 0   rosuvastatin  (CRESTOR ) 20 MG tablet Take 1 tablet (20 mg total) by mouth daily. *Make appt with Dr. Pietro for refills [phone]* 15 tablet 0   senna (SENOKOT) 8.6 MG TABS tablet Take 3 tablets (25.8 mg total) by mouth 2 (two) times daily. (Patient taking differently: Take 2 tablets by mouth 2 (two) times daily.) 270 tablet 0   tadalafil  (CIALIS ) 20 MG tablet Take 0.5-1 tablets (10-20 mg total) by mouth every other day as needed for erectile dysfunction. 30 tablet 3   tamsulosin  (FLOMAX ) 0.4 MG CAPS capsule Take 1 capsule (0.4 mg total) by mouth at bedtime. 30 capsule 5   Zinc  Sulfate 220 (50 Zn) MG TABS Take 1 tablet (220 mg total) by mouth daily. 100 tablet 5   cyclobenzaprine  (FLEXERIL ) 10 MG tablet Take 1 tablet (10  mg total) by mouth 3 (three) times daily as needed for muscle spasms. 90 tablet 3   No current facility-administered medications on file prior to visit.   Subjective: [name] is a 58 Year old male with PMH of DM, HLD, HTN, Obesity/OSA and DDD s/p recent TLIFwith pedicel screw fixation on 9/30 complicated by Kleb pneumo bacteremia post operatively concerning for surgical site infection following 10/14 debridement of lumbar wound, concerning for deep lumbar wound infection, planned for 6 weeks IV cefazolin , however course complicated by worsening back pain and  increased inflammatory markers. MRI was repeated 10/29 osteomyelitis discitis at L1-2 with associated small amount of epidural phlegmon and/or abscess within the left ventral epidural space extending from L1-2 to approximately L2-3/Associated paraspinous edema/phlegmon within the adjacent left greater than right psoas musculature, with superimposed 1 cm left psoas soft tissue abscess.  Neurosurgery did not feel another surgery was indicated and recommended IR consult for drainage of left psoas abscess. IR felt the findings in the left psoas was more of oedema than an abscess and was not drained. ID recommended repeat MRI in 2 weeks. Repeat MRI 11/23 with overall similar findings, left psoas abscess ( 1.2cm , previously 0.9cm), Additional enhancing fluid collection at the anterior left aspect of the L1-L2 disc space measures approximately 1.6 x 3.1 x 3.2 cm likely an additional focus of abscess. Small amount of phlegmon within the left ventral epidural space L2-L3. MRI findings discussed with Dr Fleeta Rothman and recommended IV abtx for 4 weeks with ID follow up. Patient discharged from the hospital 03/08/21.   IV cefazolin  was switched to IV ceftriaxone  04/11/2021 in the setting of  hardware loosening and erosive changes seen in the recent CT mechanical failure or infection related while on prolonged IV cefazolin  course. IV ceftriaxone  was continued until 04/30/21 followed by PO suppression with cefadroxil  due to + of hardware which he has been consistently taking so far  He has been following closely with pain medicine for pain management as well as neurosurgery.  Lumbar back wound has healed   06/18/23 Taking cefadroxil , no concerns with abtx or missed doses. Stopped taking Moujarao recently due to vomiting. Lost 35 lbs while taking mounjaro . Discussed about continuing antibiotics vs trialing off abtx as he has been more than 2 years on PO suppression but possible riskof recurrence of infection to which he said he  does not want to get another back surgery if in case infection recurs and prefers to be on antibiotics forever as long he does not have issues. No new concerns otherwise.   7/14 Last  seen by Pain medicine on 4/9 and  PCP on 5/21. Reports he is only on metformin  for the DM and reports he has gained approx 5 lbs since last visit (weight last visit was 306 lbs and today 305 lbs). He reports working on his diet for weight loss. Continues to take PO cefadroxil  without missed doses or concerns.   11/6 Compliant with PO cefadroxil  with no missed doses or concerns. Stopped moujanrao as it made him sick and reports still 9lbs loss. Admitted 8/4-8/5 for bilary colic. Follows PMR and was started on flexeril  for back spasms. Has received flu vaccine. No other concerns.   Review of Systems: All other systems reviewed  with pertinent positives and negatives as listed above  Past Medical History:  Diagnosis Date   Abdominal pain 01/25/2021   DDD (degenerative disc disease), lumbar    Diabetes mellitus    Hyperlipemia  Hypertension    Obesity    OSA (obstructive sleep apnea)    on CPAP   Past Surgical History:  Procedure Laterality Date   APPLICATION OF ROBOTIC ASSISTANCE FOR SPINAL PROCEDURE N/A 01/11/2021   Procedure: APPLICATION OF ROBOTIC ASSISTANCE FOR SPINAL PROCEDURE;  Surgeon: Cheryle Debby LABOR, MD;  Location: [facility] OR;  Service: Neurosurgery;  Laterality: N/A;   APPLICATION OF WOUND VAC N/A 01/25/2021   Procedure: APPLICATION OF WOUND VAC;  Surgeon: Dawley, Lani BROCKS, DO;  Location: [facility] OR;  Service: Neurosurgery;  Laterality: N/A;   BACK SURGERY     COLONOSCOPY  2019   LUMBAR WOUND DEBRIDEMENT N/A 01/25/2021   Procedure: Irrigation and Debridement of lumbar wound, and wound vacuum assisted closure;  Surgeon: Dawley, Lani BROCKS, DO;  Location: [facility] OR;  Service: Neurosurgery;  Laterality: N/A;   TRANSFORAMINAL LUMBAR INTERBODY FUSION (TLIF) WITH PEDICLE SCREW FIXATION 4 LEVEL N/A 01/11/2021    Procedure: Lumbar one-two, Lumbar two-three, Lumbar three-four, Lumbar four-five, Lumbar five Sacral one Open decompression, Transforaminal lumbar interbody fusion, posterolateral instrumented fusion;  Surgeon: Cheryle Debby LABOR, MD;  Location: [facility] OR;  Service: Neurosurgery;  Laterality: N/A;    Social History   Tobacco Use   Smoking status: Never   Smokeless tobacco: Never  Vaping Use   Vaping status: Never Used  Substance Use Topics   Alcohol use: No   Drug use: No    Family History  Problem Relation Age of Onset   Diabetes Mother    Hypertension Neg Hx    Coronary artery disease Neg Hx    Colon cancer Neg Hx    Prostate cancer Neg Hx     Allergies  Allergen Reactions   Penicillins Swelling and Other (See Comments)    REACTION: Blister and Sores in the mouth  Blisters in mouth, REACTION: Blister and Sores in the mouth   Duloxetine  Hcl Other (See Comments)    Other Reaction(s): GI Intolerance    Health Maintenance  Topic Date Due   Medicare Annual Wellness (AWV)  Never done   Hepatitis B Vaccines 19-59 Average Risk (3 of 3 - 19+ 3-dose series) 11/16/2006   COVID-19 Vaccine (5 - 2025-26 season) 12/14/2023   OPHTHALMOLOGY EXAM  07/07/2024   HEMOGLOBIN A1C  07/11/2024   FOOT EXAM  09/01/2024   Diabetic kidney evaluation - eGFR measurement  01/11/2025   Diabetic kidney evaluation - Urine ACR  01/11/2025   Colonoscopy  01/11/2025   DTaP/Tdap/Td (4 - Td or Tdap) 06/02/2032   Pneumococcal Vaccine: 50+ Years  Completed   Influenza Vaccine  Completed   Hepatitis C Screening  Completed   HIV Screening  Completed   Zoster Vaccines- Shingrix   Completed   HPV VACCINES  Aged Out   Meningococcal B Vaccine  Aged Out   Objective: BP (!) 143/101   Pulse (!) 118   Temp 97.6 F (36.4 C) (Oral)   Ht 6' 2 (1.88 m)   Wt 288 lb (130.6 kg)   SpO2 98%   BMI 36.98 kg/m   Physical Exam Constitutional:      Appearance: Normal appearance. Morbidly obese  HENT:     Head:  Normocephalic and atraumatic.      Mouth: Mucous membranes are moist.  Eyes:    Conjunctiva/sclera: Conjunctivae normal.     Pupils: Pupils are equal, round  Cardiovascular:     Rate and Rhythm: Normal rate and regular rhythm.     Heart sounds:   Pulmonary:  Effort: Pulmonary effort is normal.     Breath sounds:   Abdominal:     General:     Palpations: Abdomen is soft.   Musculoskeletal:        General: Normal range of motion.   Skin:    General: Skin is warm and dry.     Comments:   Neurological:     General: grossly non focal and ambulatory     Mental Status: awake,  alert and oriented to person, place, and time.   Psychiatric:        Mood and Affect: Mood normal.   Lab Results Lab Results  Component Value Date   WBC 9.6 11/17/2023   HGB 14.6 11/17/2023   HCT 44.2 11/17/2023   MCV 91.9 11/17/2023   PLT 211 11/17/2023    Lab Results  Component Value Date   CREATININE 1.18 01/12/2024   BUN 13 01/12/2024   NA 135 01/12/2024   K 3.6 01/12/2024   CL 97 01/12/2024   CO2 28 01/12/2024    Lab Results  Component Value Date   ALT 13 01/12/2024   AST 11 01/12/2024   ALKPHOS 66 11/17/2023   BILITOT 1.2 11/17/2023    Lab Results  Component Value Date   CHOL 82 01/12/2024   HDL 34.20 (L) 01/12/2024   LDLCALC 17 01/12/2024   LDLDIRECT 118.0 09/14/2020   TRIG 155.0 (H) 01/12/2024   CHOLHDL 2 01/12/2024   No results found for: LABRPR, RPRTITER No results found for: HIV1RNAQUANT, HIV1RNAVL, CD4TABS  Imaging   Assessment/Plan 1 Y O male with PMH of DM, HLD, HTN, Obesity/OSA and DDD s/p TLIFwith pedicle screw fixation on 9/30  complicated with   # Klebsiella Pneumoniae Lumbar Discitis and osteomyelitis complicated with hardware S/p prolonged IV cefazolin >> 12/29 switched to IV ceftriaxone   until 04/30/21 followed by PO suppression with cefadroxil  due to + of hardware CT 3/6 findings noted, no concerns for infection  # Medication Monitoring    - Continue cefadroxil  500mg  PO BID for PO suppression due to presence of hardware. Medication refilled. He would prefer to be on antibiotics and does not want to take even slightest risk of recurrence per previous conversation - 9/30 CMP and 8/5 CBC reviewed and discussed - Fu in 4 months  # Chronic back pain  - controlled  - s/p trigger point injections - On  flexeril , oxycodone  and morphine  - Fu with Dr Cornelio  # Morbid Obesity/DM - A1C 6 - weight down from 3-5 lbs to 288 since last visit  - Fu with PCP as instructed  # Immunization counseling  - discussed about Flu vaccine   I spent 20 minutes involved in face-to-face and non-face-to-face activities for this patient on the day of the visit including reviewing notes from Dr Amon 9/30 Dr Cornelio 10/1, discharge note 8/5,  ordering medications, documenting notes in EMR and counseling the patient.  Annalee Joseph, MD [facility] 02/18/2024, 8:43 AM

## 2024-02-24 ENCOUNTER — Other Ambulatory Visit (HOSPITAL_COMMUNITY): Payer: Self-pay

## 2024-02-24 ENCOUNTER — Encounter: Attending: Physical Medicine and Rehabilitation | Admitting: Physical Medicine and Rehabilitation

## 2024-02-24 ENCOUNTER — Encounter: Payer: Self-pay | Admitting: Physical Medicine and Rehabilitation

## 2024-02-24 VITALS — BP 137/92 | HR 108 | Ht 74.0 in | Wt 294.2 lb

## 2024-02-24 DIAGNOSIS — Z79891 Long term (current) use of opiate analgesic: Secondary | ICD-10-CM | POA: Diagnosis not present

## 2024-02-24 DIAGNOSIS — G894 Chronic pain syndrome: Secondary | ICD-10-CM | POA: Insufficient documentation

## 2024-02-24 DIAGNOSIS — M7918 Myalgia, other site: Secondary | ICD-10-CM | POA: Diagnosis not present

## 2024-02-24 DIAGNOSIS — Z5181 Encounter for therapeutic drug level monitoring: Secondary | ICD-10-CM | POA: Diagnosis not present

## 2024-02-24 MED ORDER — OXYCODONE HCL 10 MG PO TABS
10.0000 mg | ORAL_TABLET | Freq: Three times a day (TID) | ORAL | 0 refills | Status: DC | PRN
  Filled 2024-02-24 (×2): qty 90, 30d supply, fill #0

## 2024-02-24 MED ORDER — CYCLOBENZAPRINE HCL 10 MG PO TABS
10.0000 mg | ORAL_TABLET | Freq: Three times a day (TID) | ORAL | 5 refills | Status: DC | PRN
  Filled 2024-02-24 – 2024-04-18 (×2): qty 60, 20d supply, fill #0

## 2024-02-24 MED ORDER — LIDOCAINE HCL 1 % IJ SOLN: 3.0000 mL | Freq: Once | INTRAMUSCULAR | Status: AC

## 2024-02-24 MED ORDER — OXYCODONE HCL 10 MG PO TABS
10.0000 mg | ORAL_TABLET | Freq: Three times a day (TID) | ORAL | 0 refills | Status: DC | PRN
  Filled 2024-02-24: qty 90, 30d supply, fill #0

## 2024-02-24 NOTE — Patient Instructions (Signed)
 Plan:   Change Flexeril  to 10 mg 2x/day as needed- since really needs to take no more than 5 days/week.  - will refill Flexeril     2.  Con't Oxycodone  10 mg up to 3x/day as needed-  #90- 2 refills to make sure gets it regularly.   3. Patient here for trigger point injections for  Consent done and on chart.  Cleaned areas with alcohol and injected using a 27 gauge 1.5 inch needle  Injected  3cc- over 6 spots in lumbar paraspinals  B/L  Using 1% Lidocaine  with no EPI   Patient's level of pain prior was  7/10 Current level of pain after injections is down to 4/10- really relaxed  There was no bleeding or complications.  Patient was advised to drink a lot of water on day after injections to flush system Will have increased soreness for 12-48 hours after injections.  Can use Lidocaine  patches the day AFTER injections Can use theracane on day of injections in places didn't inject Can use heating pad 4-6 hours AFTER injections  4.   Did oral drug screen today per clinic policy since on MS Contin -   5. F/U  q 6 weeks for trP injections and f/u on chronic pain.

## 2024-02-24 NOTE — Progress Notes (Signed)
 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  Also has DM- last A1c 6.2.      And for trigger point injections   Muscle relaxants helped  100% The spasms aren't as big- much smaller.  Flexeril -  takes it 2x/day.  Every day.    Surgeon wants to go and put in 2 rods and 2 plates- where 2 screws are broken on spine, but trying to avoid surgery.  So getting trp injections to prevent that.   Needs injections in L and R low back in paraspinals. -   Injections usually last 4 weeks or so Also uses freezing ball to hold pressure on trigger points- uses daily for 10 minutes.    Plan:   Change Flexeril  to 10 mg 2x/day as needed- since really needs to take no more than 5 days/week.  - will refill Flexeril     2.  Con't Oxycodone  10 mg up to 3x/day as needed-  #90- 2 refills to make sure gets it regularly.  Likes Oxy better than MS Contin - will keep current dosage  3. Patient here for trigger point injections for  Consent done and on chart.  Cleaned areas with alcohol and injected using a 27 gauge 1.5 inch needle  Injected  3cc- over 6 spots in lumbar paraspinals  B/L  Using 1% Lidocaine  with no EPI   Patient's level of pain prior was  7/10 Current level of pain after injections is down to 4/10- really relaxed  There was no bleeding or complications.  Patient was advised to drink a lot of water on day after injections to flush system Will have increased soreness for 12-48 hours after injections.  Can use Lidocaine  patches the day AFTER injections Can use theracane on day of injections in places didn't inject Can use heating pad 4-6 hours AFTER injections  4.   Did oral drug screen today per clinic  policy since on MS Contin -   5. F/U  q 6 weeks for trP injections and f/u on chronic pain.  Needs multiple appointments-   I spent a total of  25   minutes on total care today- >50% coordination of care- due to 5 minute son injections- rest was spent talking about MS Contin  vs Oxycodone - decided to stay with Oxycodone - also d/w pt about surgical recs that were made- since 2 broken screws- and what we are doing to avoid this surgery.

## 2024-02-29 LAB — DRUG TOX MONITOR 1 W/CONF, ORAL FLD
Amphetamines: NEGATIVE ng/mL (ref <10)
Barbiturates: NEGATIVE ng/mL (ref <10)
Benzodiazepines: NEGATIVE ng/mL (ref <0.50)
Buprenorphine: NEGATIVE ng/mL (ref <0.10)
Cocaine: NEGATIVE ng/mL (ref <5.0)
Codeine: NEGATIVE ng/mL (ref <2.5)
Dihydrocodeine: NEGATIVE ng/mL (ref <2.5)
Fentanyl: NEGATIVE ng/mL (ref <0.10)
Heroin Metabolite: NEGATIVE ng/mL (ref <1.0)
Hydrocodone: NEGATIVE ng/mL (ref <2.5)
Hydromorphone: NEGATIVE ng/mL (ref <2.5)
MARIJUANA: NEGATIVE ng/mL (ref <2.5)
MDMA: NEGATIVE ng/mL (ref <10)
Meprobamate: NEGATIVE ng/mL (ref <2.5)
Methadone: NEGATIVE ng/mL (ref <5.0)
Morphine: NEGATIVE ng/mL (ref <2.5)
Nicotine Metabolite: NEGATIVE ng/mL (ref <5.0)
Norhydrocodone: NEGATIVE ng/mL (ref <2.5)
Noroxycodone: 8.4 ng/mL — ABNORMAL HIGH (ref <2.5)
Opiates: POSITIVE ng/mL — AB (ref <2.5)
Oxycodone: 95.3 ng/mL — ABNORMAL HIGH (ref <2.5)
Oxymorphone: NEGATIVE ng/mL (ref <2.5)
Phencyclidine: NEGATIVE ng/mL (ref <10)
Tapentadol: NEGATIVE ng/mL (ref <5.0)
Tramadol: NEGATIVE ng/mL (ref <5.0)
Zolpidem: NEGATIVE ng/mL (ref <5.0)

## 2024-02-29 LAB — DRUG TOX ALC METAB W/CON, ORAL FLD: Alcohol Metabolite: NEGATIVE ng/mL (ref <25)

## 2024-03-02 ENCOUNTER — Other Ambulatory Visit: Payer: Self-pay | Admitting: Internal Medicine

## 2024-03-02 ENCOUNTER — Other Ambulatory Visit (HOSPITAL_COMMUNITY): Payer: Self-pay

## 2024-03-02 MED ORDER — METFORMIN HCL 1000 MG PO TABS
1000.0000 mg | ORAL_TABLET | Freq: Two times a day (BID) | ORAL | 1 refills | Status: AC
  Filled 2024-03-02 – 2024-04-18 (×2): qty 180, 90d supply, fill #0

## 2024-03-17 ENCOUNTER — Other Ambulatory Visit (HOSPITAL_COMMUNITY): Payer: Self-pay

## 2024-03-17 ENCOUNTER — Other Ambulatory Visit: Payer: Self-pay

## 2024-03-21 ENCOUNTER — Other Ambulatory Visit (HOSPITAL_COMMUNITY): Payer: Self-pay

## 2024-03-22 ENCOUNTER — Other Ambulatory Visit (HOSPITAL_COMMUNITY): Payer: Self-pay

## 2024-03-23 ENCOUNTER — Other Ambulatory Visit (HOSPITAL_COMMUNITY): Payer: Self-pay

## 2024-04-01 ENCOUNTER — Encounter: Attending: Physical Medicine and Rehabilitation | Admitting: Physical Medicine and Rehabilitation

## 2024-04-01 ENCOUNTER — Encounter: Payer: Self-pay | Admitting: Physical Medicine and Rehabilitation

## 2024-04-01 ENCOUNTER — Other Ambulatory Visit (HOSPITAL_COMMUNITY): Payer: Self-pay

## 2024-04-01 VITALS — BP 137/88 | HR 117 | Ht 74.0 in | Wt 302.0 lb

## 2024-04-01 DIAGNOSIS — G894 Chronic pain syndrome: Secondary | ICD-10-CM | POA: Insufficient documentation

## 2024-04-01 DIAGNOSIS — M5416 Radiculopathy, lumbar region: Secondary | ICD-10-CM | POA: Insufficient documentation

## 2024-04-01 DIAGNOSIS — M7918 Myalgia, other site: Secondary | ICD-10-CM | POA: Diagnosis present

## 2024-04-01 MED ORDER — OXYCODONE HCL 10 MG PO TABS
10.0000 mg | ORAL_TABLET | Freq: Three times a day (TID) | ORAL | 0 refills | Status: DC | PRN
  Filled 2024-04-01: qty 90, 30d supply, fill #0

## 2024-04-01 MED ORDER — LIDOCAINE HCL 1 % IJ SOLN
6.0000 mL | Freq: Once | INTRAMUSCULAR | Status: AC
  Administered 2024-04-01: 6 mL

## 2024-04-01 NOTE — Addendum Note (Signed)
 Addended by: JAMA FLEMING T on: 04/01/2024 10:39 AM   Modules accepted: Orders

## 2024-04-01 NOTE — Progress Notes (Signed)
 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  Also has DM- last A1c 6.2.      And for trigger point injections       Needs refills of pain meds  Things going well Flexeril  is helping a lot-  helps reduce size of trigger points.   Pain meds working well No C'x- from them, except 30-45 minute nap after takes them. Almost every day.   Pt reports injections last 5-6 weeks, just starting ot wear off when comes in again.     Plan:  1.Will refill Oxycodone   10 mg 3x/day as needed- #90- doing well- won't change- will refill x2 for pt  2. Last drug screen 02/24/24- doing well  3. Patient here for trigger point injections for  Consent done and on chart.  Cleaned areas with alcohol and injected using a 27 gauge 1.5 inch needle  Injected 5cc- wasted 1cc Using 1% Lidocaine  with no EPI  Upper traps Levators Posterior scalenes Middle scalenes Splenius Capitus Pectoralis Major Rhomboids Infraspinatus Teres Major/minor Thoracic paraspinals BL Lumbar paraspinals B/L x4 Other injections-    Patient's level of pain prior was 7/10 Current level of pain after injections is 3/10  There was no bleeding or complications.  Patient was advised to drink a lot of water on day after injections to flush system Will have increased soreness for 12-48 hours after injections.  Can use Lidocaine  patches the day AFTER injections Can use theracane on day of injections in places didn't inject Can use heating pad 4-6 hours AFTER injections   4. Con't Flexeril - doesn't need refills today.    5. Reviewed chart and orders- no other refills needed   6. F/U in 6 weeks - f/u on chronic pain and trp Injections

## 2024-04-01 NOTE — Patient Instructions (Signed)
 Plan:  1.Will refill Oxycodone   10 mg 3x/day as needed- #90- doing well- won't change- will refill x2 for pt  2. Last drug screen 02/24/24- doing well  3. Patient here for trigger point injections for  Consent done and on chart.  Cleaned areas with alcohol and injected using a 27 gauge 1.5 inch needle  Injected 5cc- wasted 1cc Using 1% Lidocaine  with no EPI  Upper traps Levators Posterior scalenes Middle scalenes Splenius Capitus Pectoralis Major Rhomboids Infraspinatus Teres Major/minor Thoracic paraspinals BL Lumbar paraspinals B/L x4 Other injections-    Patient's level of pain prior was 7/10 Current level of pain after injections is 3/10  There was no bleeding or complications.  Patient was advised to drink a lot of water on day after injections to flush system Will have increased soreness for 12-48 hours after injections.  Can use Lidocaine  patches the day AFTER injections Can use theracane on day of injections in places didn't inject Can use heating pad 4-6 hours AFTER injections   4. Con't Flexeril - doesn't need refills today.    5. Reviewed chart and orders- no other refills needed   6. F/U in 6 weeks - f/u on chronic pain and trp Injections

## 2024-04-04 ENCOUNTER — Other Ambulatory Visit (HOSPITAL_COMMUNITY): Payer: Self-pay

## 2024-04-04 MED ORDER — TAMSULOSIN HCL 0.4 MG PO CAPS
0.4000 mg | ORAL_CAPSULE | Freq: Every day | ORAL | 0 refills | Status: AC
  Filled 2024-04-04: qty 30, 30d supply, fill #0

## 2024-04-13 ENCOUNTER — Other Ambulatory Visit (HOSPITAL_COMMUNITY): Payer: Self-pay

## 2024-04-18 ENCOUNTER — Other Ambulatory Visit (HOSPITAL_COMMUNITY): Payer: Self-pay

## 2024-04-18 ENCOUNTER — Other Ambulatory Visit: Payer: Self-pay

## 2024-04-19 ENCOUNTER — Other Ambulatory Visit (HOSPITAL_COMMUNITY): Payer: Self-pay

## 2024-05-13 ENCOUNTER — Encounter: Payer: Self-pay | Admitting: Physical Medicine and Rehabilitation

## 2024-05-13 ENCOUNTER — Encounter: Attending: Physical Medicine and Rehabilitation | Admitting: Physical Medicine and Rehabilitation

## 2024-05-13 ENCOUNTER — Other Ambulatory Visit (HOSPITAL_COMMUNITY): Payer: Self-pay

## 2024-05-13 VITALS — BP 152/97 | HR 109 | Ht 74.0 in | Wt 304.0 lb

## 2024-05-13 DIAGNOSIS — M5106 Intervertebral disc disorders with myelopathy, lumbar region: Secondary | ICD-10-CM | POA: Insufficient documentation

## 2024-05-13 DIAGNOSIS — M5416 Radiculopathy, lumbar region: Secondary | ICD-10-CM | POA: Insufficient documentation

## 2024-05-13 DIAGNOSIS — M7918 Myalgia, other site: Secondary | ICD-10-CM | POA: Diagnosis present

## 2024-05-13 MED ORDER — CYCLOBENZAPRINE HCL 10 MG PO TABS
10.0000 mg | ORAL_TABLET | Freq: Three times a day (TID) | ORAL | 1 refills | Status: AC | PRN
  Filled 2024-05-13: qty 270, 90d supply, fill #0

## 2024-05-13 MED ORDER — OXYCODONE HCL 10 MG PO TABS
10.0000 mg | ORAL_TABLET | Freq: Three times a day (TID) | ORAL | 0 refills | Status: AC | PRN
  Filled 2024-05-13: qty 90, 30d supply, fill #0

## 2024-05-13 MED ORDER — LIDOCAINE HCL 1 % IJ SOLN
6.0000 mL | Freq: Once | INTRAMUSCULAR | Status: AC
  Administered 2024-05-13: 6 mL

## 2024-05-13 NOTE — Patient Instructions (Addendum)
 Plan: Will refill oxycodone  10 mg  3x/day as needed will send in 2 refills for G89.4- chronic pain.  Now and one in 28 days.   2. Con't Flexeril   10 mg 2x/day as needed- #270- with 1 refill- 6 months supply  3. Patient here for trigger point injections for  Consent done and on chart.  Cleaned areas with alcohol and injected using a 27 gauge 1.5 inch needle  Injected 3cc- none wasted Using 1% Lidocaine  with no EPI  Upper traps  Levators Posterior scalenes Middle scalenes Splenius Capitus Pectoralis Major Rhomboids Infraspinatus Teres Major/minor Thoracic paraspinals Lumbar paraspinals-  B/L 1.5 cc-  5 spots on each side.  Other injections-    Patient's level of pain prior was Current level of pain after injections is  There was no bleeding or complications.  Patient was advised to drink a lot of water on day after injections to flush system Will have increased soreness for 12-48 hours after injections.  Can use Lidocaine  patches the day AFTER injections Can use theracane on day of injections in places didn't inject Can use heating pad 4-6 hours AFTER injections  4. F/U- 6 weeks- trp injections and f/u on chornic pain

## 2024-05-13 NOTE — Progress Notes (Signed)
 Pt is a 59 yr old male with lumbar radiculopathy/myelopathy secondary to nerve root compression and bacteremia from Klebsiella pneumonia and large back incision with neurogenic bowel and bladder and chronic pain now as a result- also has chronic osteomyelitis of lumbar spine Here for f/u on incomplete paraplegia and chronic pain.  Also has DM- last A1c 6.2.      And for trigger point injections     things pretty good.    Got injection in spine on Monday- kicked in already- was hurting when went in but OK once left the appt.   Knots on B/B sides of low back- came back after ~ 2 weeks- not as big when comes back- much smaller- and then build /gets bigger over time.   Were monsters in August-was so bad couldn't barely move.   Usually takes Flexeril  2x/day- and they have made a big difference.   Pain meds working well- now - Oxycodone  takes 2x/day usually- occ takes 3x/day, so doesn't want to get the dose decreased currently.     Plan: Will refill oxycodone  10 mg  3x/day as needed will send in 2 refills for G89.4- chronic pain.  Now and one in 28 days.   2. Con't Flexeril   10 mg 2x/day as needed- #270- with 1 refill- 6 months supply  3. Patient here for trigger point injections for  Consent done and on chart.  Cleaned areas with alcohol and injected using a 27 gauge 1.5 inch needle  Injected 3cc- none wasted Using 1% Lidocaine  with no EPI  Upper traps  Levators Posterior scalenes Middle scalenes Splenius Capitus Pectoralis Major Rhomboids Infraspinatus Teres Major/minor Thoracic paraspinals Lumbar paraspinals-  B/L 1.5 cc-  5 spots on each side.  Other injections-    Patient's level of pain prior was Current level of pain after injections is  There was no bleeding or complications.  Patient was advised to drink a lot of water on day after injections to flush system Will have increased soreness for 12-48 hours after injections.  Can use Lidocaine  patches the day  AFTER injections Can use theracane on day of injections in places didn't inject Can use heating pad 4-6 hours AFTER injections  4. F/U- 6 weeks- trp injections   I spent a total of  28   minutes on total care today- >50% coordination of care- due to 7 minutes o injections- rest d/w pt about pain meds and decided to keep same dosing.

## 2024-05-16 ENCOUNTER — Ambulatory Visit: Admitting: Internal Medicine

## 2024-05-17 ENCOUNTER — Ambulatory Visit

## 2024-06-16 ENCOUNTER — Ambulatory Visit: Admitting: Infectious Diseases

## 2024-06-22 ENCOUNTER — Ambulatory Visit: Admitting: Internal Medicine

## 2024-06-24 ENCOUNTER — Encounter: Admitting: Physical Medicine and Rehabilitation

## 2024-08-05 ENCOUNTER — Encounter: Admitting: Physical Medicine and Rehabilitation

## 2024-09-16 ENCOUNTER — Encounter: Admitting: Physical Medicine and Rehabilitation
